# Patient Record
Sex: Female | Born: 1950
Health system: Southern US, Community
[De-identification: ages and names within clinical notes are randomized; demographics above are authoritative.]

## PROBLEM LIST (undated history)

## (undated) DIAGNOSIS — R011 Cardiac murmur, unspecified: Secondary | ICD-10-CM

## (undated) DIAGNOSIS — M1990 Unspecified osteoarthritis, unspecified site: Secondary | ICD-10-CM

## (undated) DIAGNOSIS — K219 Gastro-esophageal reflux disease without esophagitis: Secondary | ICD-10-CM

## (undated) DIAGNOSIS — K8020 Calculus of gallbladder without cholecystitis without obstruction: Secondary | ICD-10-CM

## (undated) DIAGNOSIS — F419 Anxiety disorder, unspecified: Secondary | ICD-10-CM

## (undated) DIAGNOSIS — Z8669 Personal history of other diseases of the nervous system and sense organs: Secondary | ICD-10-CM

## (undated) DIAGNOSIS — N809 Endometriosis, unspecified: Secondary | ICD-10-CM

## (undated) DIAGNOSIS — G459 Transient cerebral ischemic attack, unspecified: Secondary | ICD-10-CM

## (undated) DIAGNOSIS — M2550 Pain in unspecified joint: Secondary | ICD-10-CM

## (undated) DIAGNOSIS — T7840XA Allergy, unspecified, initial encounter: Secondary | ICD-10-CM

## (undated) DIAGNOSIS — G4730 Sleep apnea, unspecified: Secondary | ICD-10-CM

## (undated) DIAGNOSIS — Z9889 Other specified postprocedural states: Secondary | ICD-10-CM

## (undated) DIAGNOSIS — K317 Polyp of stomach and duodenum: Secondary | ICD-10-CM

## (undated) DIAGNOSIS — K3184 Gastroparesis: Secondary | ICD-10-CM

## (undated) DIAGNOSIS — R112 Nausea with vomiting, unspecified: Secondary | ICD-10-CM

## (undated) DIAGNOSIS — I10 Essential (primary) hypertension: Secondary | ICD-10-CM

## (undated) DIAGNOSIS — R51 Headache: Secondary | ICD-10-CM

## (undated) DIAGNOSIS — K589 Irritable bowel syndrome without diarrhea: Secondary | ICD-10-CM

## (undated) DIAGNOSIS — K5900 Constipation, unspecified: Secondary | ICD-10-CM

## (undated) DIAGNOSIS — K635 Polyp of colon: Secondary | ICD-10-CM

## (undated) DIAGNOSIS — E119 Type 2 diabetes mellitus without complications: Secondary | ICD-10-CM

## (undated) DIAGNOSIS — M519 Unspecified thoracic, thoracolumbar and lumbosacral intervertebral disc disorder: Secondary | ICD-10-CM

## (undated) DIAGNOSIS — R0600 Dyspnea, unspecified: Secondary | ICD-10-CM

## (undated) DIAGNOSIS — E785 Hyperlipidemia, unspecified: Secondary | ICD-10-CM

## (undated) DIAGNOSIS — R519 Headache, unspecified: Secondary | ICD-10-CM

## (undated) DIAGNOSIS — Z953 Presence of xenogenic heart valve: Secondary | ICD-10-CM

## (undated) DIAGNOSIS — I5032 Chronic diastolic (congestive) heart failure: Secondary | ICD-10-CM

## (undated) DIAGNOSIS — I639 Cerebral infarction, unspecified: Secondary | ICD-10-CM

## (undated) HISTORY — PX: ESOPHAGOGASTRODUODENOSCOPY: SHX1529

## (undated) HISTORY — DX: Dyspnea, unspecified: R06.00

## (undated) HISTORY — DX: Polyp of stomach and duodenum: K31.7

## (undated) HISTORY — PX: OOPHORECTOMY: SHX86

## (undated) HISTORY — DX: Endometriosis, unspecified: N80.9

## (undated) HISTORY — PX: LAPAROSCOPIC CHOLECYSTECTOMY: SUR755

## (undated) HISTORY — DX: Essential (primary) hypertension: I10

## (undated) HISTORY — PX: ROTATOR CUFF REPAIR: SHX139

## (undated) HISTORY — DX: Transient cerebral ischemic attack, unspecified: G45.9

## (undated) HISTORY — DX: Allergy, unspecified, initial encounter: T78.40XA

## (undated) HISTORY — DX: Pain in unspecified joint: M25.50

## (undated) HISTORY — DX: Constipation, unspecified: K59.00

## (undated) HISTORY — PX: ABDOMINAL HYSTERECTOMY: SHX81

## (undated) HISTORY — DX: Gastroparesis: K31.84

## (undated) HISTORY — PX: LASIK: SHX215

## (undated) HISTORY — DX: Polyp of colon: K63.5

## (undated) HISTORY — DX: Personal history of other diseases of the nervous system and sense organs: Z86.69

## (undated) HISTORY — DX: Unspecified thoracic, thoracolumbar and lumbosacral intervertebral disc disorder: M51.9

## (undated) HISTORY — PX: COLONOSCOPY: SHX174

## (undated) HISTORY — DX: Hyperlipidemia, unspecified: E78.5

## (undated) HISTORY — DX: Calculus of gallbladder without cholecystitis without obstruction: K80.20

## (undated) HISTORY — DX: Irritable bowel syndrome, unspecified: K58.9

## (undated) HISTORY — PX: EYE SURGERY: SHX253

## (undated) HISTORY — PX: CARPAL TUNNEL RELEASE: SHX101

## (undated) HISTORY — DX: Cerebral infarction, unspecified: I63.9

---

## 1978-06-24 HISTORY — PX: BREAST ENHANCEMENT SURGERY: SHX7

## 1999-01-15 ENCOUNTER — Other Ambulatory Visit: Admission: RE | Admit: 1999-01-15 | Discharge: 1999-01-15 | Payer: Self-pay | Admitting: Obstetrics and Gynecology

## 1999-02-28 ENCOUNTER — Ambulatory Visit (HOSPITAL_COMMUNITY): Admission: RE | Admit: 1999-02-28 | Discharge: 1999-02-28 | Payer: Self-pay | Admitting: Cardiovascular Disease

## 1999-02-28 ENCOUNTER — Encounter: Payer: Self-pay | Admitting: Cardiovascular Disease

## 1999-04-24 ENCOUNTER — Encounter (INDEPENDENT_AMBULATORY_CARE_PROVIDER_SITE_OTHER): Payer: Self-pay

## 1999-04-24 ENCOUNTER — Other Ambulatory Visit: Admission: RE | Admit: 1999-04-24 | Discharge: 1999-04-24 | Payer: Self-pay | Admitting: Internal Medicine

## 1999-05-29 ENCOUNTER — Ambulatory Visit (HOSPITAL_COMMUNITY): Admission: RE | Admit: 1999-05-29 | Discharge: 1999-05-29 | Payer: Self-pay | Admitting: Internal Medicine

## 2000-01-05 ENCOUNTER — Emergency Department (HOSPITAL_COMMUNITY): Admission: EM | Admit: 2000-01-05 | Discharge: 2000-01-05 | Payer: Self-pay | Admitting: Emergency Medicine

## 2000-02-08 ENCOUNTER — Other Ambulatory Visit: Admission: RE | Admit: 2000-02-08 | Discharge: 2000-02-08 | Payer: Self-pay | Admitting: Obstetrics and Gynecology

## 2000-12-24 ENCOUNTER — Other Ambulatory Visit: Admission: RE | Admit: 2000-12-24 | Discharge: 2000-12-24 | Payer: Self-pay | Admitting: Obstetrics and Gynecology

## 2001-07-01 ENCOUNTER — Encounter: Payer: Self-pay | Admitting: Otolaryngology

## 2001-07-01 ENCOUNTER — Encounter: Admission: RE | Admit: 2001-07-01 | Discharge: 2001-07-01 | Payer: Self-pay | Admitting: Otolaryngology

## 2001-07-31 ENCOUNTER — Encounter: Admission: RE | Admit: 2001-07-31 | Discharge: 2001-07-31 | Payer: Self-pay | Admitting: Otolaryngology

## 2001-07-31 ENCOUNTER — Encounter: Payer: Self-pay | Admitting: Otolaryngology

## 2002-02-24 ENCOUNTER — Other Ambulatory Visit: Admission: RE | Admit: 2002-02-24 | Discharge: 2002-02-24 | Payer: Self-pay | Admitting: Obstetrics and Gynecology

## 2003-01-19 ENCOUNTER — Other Ambulatory Visit: Admission: RE | Admit: 2003-01-19 | Discharge: 2003-01-19 | Payer: Self-pay | Admitting: Obstetrics and Gynecology

## 2003-02-01 ENCOUNTER — Other Ambulatory Visit: Admission: RE | Admit: 2003-02-01 | Discharge: 2003-02-01 | Payer: Self-pay | Admitting: Obstetrics and Gynecology

## 2004-09-25 ENCOUNTER — Ambulatory Visit: Payer: Self-pay | Admitting: Internal Medicine

## 2004-09-27 ENCOUNTER — Ambulatory Visit: Payer: Self-pay | Admitting: Internal Medicine

## 2004-10-03 ENCOUNTER — Ambulatory Visit: Payer: Self-pay | Admitting: Internal Medicine

## 2005-02-22 ENCOUNTER — Ambulatory Visit: Payer: Self-pay | Admitting: Internal Medicine

## 2005-02-27 ENCOUNTER — Ambulatory Visit: Payer: Self-pay | Admitting: Internal Medicine

## 2005-03-04 ENCOUNTER — Ambulatory Visit: Payer: Self-pay | Admitting: Internal Medicine

## 2005-03-12 ENCOUNTER — Encounter: Admission: RE | Admit: 2005-03-12 | Discharge: 2005-03-12 | Payer: Self-pay | Admitting: Internal Medicine

## 2005-03-18 ENCOUNTER — Ambulatory Visit: Payer: Self-pay | Admitting: Internal Medicine

## 2005-03-27 ENCOUNTER — Ambulatory Visit: Payer: Self-pay | Admitting: Internal Medicine

## 2005-04-03 ENCOUNTER — Ambulatory Visit (HOSPITAL_COMMUNITY): Admission: RE | Admit: 2005-04-03 | Discharge: 2005-04-03 | Payer: Self-pay | Admitting: Internal Medicine

## 2005-04-09 ENCOUNTER — Encounter (INDEPENDENT_AMBULATORY_CARE_PROVIDER_SITE_OTHER): Payer: Self-pay | Admitting: Specialist

## 2005-04-09 ENCOUNTER — Ambulatory Visit: Payer: Self-pay | Admitting: Internal Medicine

## 2005-04-29 ENCOUNTER — Ambulatory Visit: Payer: Self-pay | Admitting: Internal Medicine

## 2005-05-20 ENCOUNTER — Ambulatory Visit: Payer: Self-pay | Admitting: Internal Medicine

## 2005-06-03 ENCOUNTER — Encounter: Admission: RE | Admit: 2005-06-03 | Discharge: 2005-07-16 | Payer: Self-pay | Admitting: Sports Medicine

## 2005-06-24 HISTORY — PX: BLADDER SUSPENSION: SHX72

## 2005-07-23 ENCOUNTER — Encounter: Admission: RE | Admit: 2005-07-23 | Discharge: 2005-09-19 | Payer: Self-pay | Admitting: Sports Medicine

## 2006-04-15 ENCOUNTER — Ambulatory Visit (HOSPITAL_COMMUNITY): Admission: RE | Admit: 2006-04-15 | Discharge: 2006-04-16 | Payer: Self-pay | Admitting: Obstetrics and Gynecology

## 2006-04-20 ENCOUNTER — Inpatient Hospital Stay (HOSPITAL_COMMUNITY): Admission: AD | Admit: 2006-04-20 | Discharge: 2006-04-20 | Payer: Self-pay | Admitting: Obstetrics and Gynecology

## 2006-05-20 ENCOUNTER — Ambulatory Visit: Payer: Self-pay | Admitting: Internal Medicine

## 2006-10-22 ENCOUNTER — Encounter: Admission: RE | Admit: 2006-10-22 | Discharge: 2006-10-22 | Payer: Self-pay | Admitting: Sports Medicine

## 2006-11-05 ENCOUNTER — Encounter: Admission: RE | Admit: 2006-11-05 | Discharge: 2006-11-05 | Payer: Self-pay | Admitting: Sports Medicine

## 2007-02-12 DIAGNOSIS — J309 Allergic rhinitis, unspecified: Secondary | ICD-10-CM | POA: Insufficient documentation

## 2007-02-12 DIAGNOSIS — J45909 Unspecified asthma, uncomplicated: Secondary | ICD-10-CM | POA: Insufficient documentation

## 2007-04-16 ENCOUNTER — Ambulatory Visit: Payer: Self-pay | Admitting: Internal Medicine

## 2007-04-28 ENCOUNTER — Telehealth (INDEPENDENT_AMBULATORY_CARE_PROVIDER_SITE_OTHER): Payer: Self-pay | Admitting: *Deleted

## 2007-08-03 ENCOUNTER — Ambulatory Visit: Payer: Self-pay | Admitting: Internal Medicine

## 2007-08-03 LAB — CONVERTED CEMR LAB
ALT: 18 units/L (ref 0–35)
AST: 21 units/L (ref 0–37)
Albumin: 4.1 g/dL (ref 3.5–5.2)
Alkaline Phosphatase: 154 units/L — ABNORMAL HIGH (ref 39–117)
BUN: 12 mg/dL (ref 6–23)
Basophils Absolute: 0 10*3/uL (ref 0.0–0.1)
Basophils Relative: 0.4 % (ref 0.0–1.0)
Bilirubin Urine: NEGATIVE
Bilirubin, Direct: 0.2 mg/dL (ref 0.0–0.3)
Blood in Urine, dipstick: NEGATIVE
CO2: 28 meq/L (ref 19–32)
Calcium: 8.9 mg/dL (ref 8.4–10.5)
Chloride: 103 meq/L (ref 96–112)
Cholesterol: 211 mg/dL (ref 0–200)
Creatinine, Ser: 0.7 mg/dL (ref 0.4–1.2)
Direct LDL: 133.3 mg/dL
Eosinophils Absolute: 0 10*3/uL (ref 0.0–0.6)
Eosinophils Relative: 0.5 % (ref 0.0–5.0)
GFR calc Af Amer: 111 mL/min
GFR calc non Af Amer: 92 mL/min
Glucose, Bld: 99 mg/dL (ref 70–99)
Glucose, Urine, Semiquant: NEGATIVE
HCT: 44.7 % (ref 36.0–46.0)
HDL: 57 mg/dL (ref >39.0)
Hemoglobin: 15 g/dL (ref 12.0–15.0)
Ketones, urine, test strip: NEGATIVE
Lymphocytes Relative: 44.7 % (ref 12.0–46.0)
MCHC: 33.6 g/dL (ref 30.0–36.0)
MCV: 90.4 fL (ref 78.0–100.0)
Monocytes Absolute: 0.6 10*3/uL (ref 0.2–0.7)
Monocytes Relative: 10.6 % (ref 3.0–11.0)
Neutro Abs: 2.5 10*3/uL (ref 1.4–7.7)
Neutrophils Relative %: 43.8 % (ref 43.0–77.0)
Nitrite: NEGATIVE
Platelets: 230 10*3/uL (ref 150–400)
Potassium: 4.3 meq/L (ref 3.5–5.1)
RBC: 4.95 M/uL (ref 3.87–5.11)
RDW: 11.7 % (ref 11.5–14.6)
Sodium: 140 meq/L (ref 135–145)
Specific Gravity, Urine: >=1.03
TSH: 3.91 microintl units/mL (ref 0.35–5.50)
Total Bilirubin: 0.6 mg/dL (ref 0.3–1.2)
Total CHOL/HDL Ratio: 3.7
Total Protein: 6.6 g/dL (ref 6.0–8.3)
Triglycerides: 120 mg/dL (ref 0–149)
Urobilinogen, UA: NEGATIVE
VLDL: 24 mg/dL (ref 0–40)
WBC Urine, dipstick: NEGATIVE
WBC: 5.8 10*3/uL (ref 4.5–10.5)
pH: 5.5

## 2007-08-06 ENCOUNTER — Ambulatory Visit: Payer: Self-pay | Admitting: Internal Medicine

## 2007-08-07 ENCOUNTER — Telehealth: Payer: Self-pay | Admitting: Internal Medicine

## 2007-10-05 ENCOUNTER — Ambulatory Visit: Payer: Self-pay | Admitting: Internal Medicine

## 2008-02-02 ENCOUNTER — Ambulatory Visit: Payer: Self-pay | Admitting: Internal Medicine

## 2008-02-02 LAB — CONVERTED CEMR LAB
ALT: 17 units/L (ref 0–35)
AST: 21 units/L (ref 0–37)
Albumin: 3.8 g/dL (ref 3.5–5.2)
Alkaline Phosphatase: 118 units/L — ABNORMAL HIGH (ref 39–117)
Bilirubin, Direct: 0.1 mg/dL (ref 0.0–0.3)
Cholesterol: 226 mg/dL (ref 0–200)
Direct LDL: 147.2 mg/dL
HDL: 46.2 mg/dL (ref >39.0)
Total Bilirubin: 0.9 mg/dL (ref 0.3–1.2)
Total CHOL/HDL Ratio: 4.9
Total Protein: 6.3 g/dL (ref 6.0–8.3)
Triglycerides: 169 mg/dL — ABNORMAL HIGH (ref 0–149)
VLDL: 34 mg/dL (ref 0–40)

## 2008-02-09 ENCOUNTER — Ambulatory Visit: Payer: Self-pay | Admitting: Internal Medicine

## 2008-02-09 DIAGNOSIS — E785 Hyperlipidemia, unspecified: Secondary | ICD-10-CM

## 2008-02-09 DIAGNOSIS — E1169 Type 2 diabetes mellitus with other specified complication: Secondary | ICD-10-CM | POA: Insufficient documentation

## 2008-02-10 LAB — CONVERTED CEMR LAB: Pap Smear: NORMAL

## 2008-05-03 ENCOUNTER — Ambulatory Visit: Payer: Self-pay | Admitting: Internal Medicine

## 2008-05-03 LAB — CONVERTED CEMR LAB
ALT: 21 units/L (ref 0–35)
AST: 21 units/L (ref 0–37)
Albumin: 3.8 g/dL (ref 3.5–5.2)
Alkaline Phosphatase: 122 units/L — ABNORMAL HIGH (ref 39–117)
Bilirubin, Direct: 0.1 mg/dL (ref 0.0–0.3)
Cholesterol: 200 mg/dL (ref 0–200)
HDL: 55.5 mg/dL (ref >39.0)
LDL Cholesterol: 126 mg/dL — ABNORMAL HIGH (ref 0–99)
Total Bilirubin: 0.8 mg/dL (ref 0.3–1.2)
Total CHOL/HDL Ratio: 3.6
Total Protein: 6.9 g/dL (ref 6.0–8.3)
Triglycerides: 93 mg/dL (ref 0–149)
VLDL: 19 mg/dL (ref 0–40)

## 2008-05-10 ENCOUNTER — Ambulatory Visit: Payer: Self-pay | Admitting: Internal Medicine

## 2008-07-19 ENCOUNTER — Encounter: Admission: RE | Admit: 2008-07-19 | Discharge: 2008-07-19 | Payer: Self-pay | Admitting: Orthopedic Surgery

## 2008-08-02 ENCOUNTER — Encounter: Admission: RE | Admit: 2008-08-02 | Discharge: 2008-08-02 | Payer: Self-pay | Admitting: Orthopedic Surgery

## 2008-08-04 ENCOUNTER — Ambulatory Visit: Payer: Self-pay | Admitting: Internal Medicine

## 2008-08-04 LAB — CONVERTED CEMR LAB
ALT: 23 units/L (ref 0–35)
AST: 20 units/L (ref 0–37)
Albumin: 3.6 g/dL (ref 3.5–5.2)
Alkaline Phosphatase: 118 units/L — ABNORMAL HIGH (ref 39–117)
BUN: 15 mg/dL (ref 6–23)
Basophils Absolute: 0.1 10*3/uL (ref 0.0–0.1)
Basophils Relative: 0.7 % (ref 0.0–3.0)
Bilirubin Urine: NEGATIVE
Bilirubin, Direct: 0.1 mg/dL (ref 0.0–0.3)
Blood in Urine, dipstick: NEGATIVE
CO2: 30 meq/L (ref 19–32)
Calcium: 9.3 mg/dL (ref 8.4–10.5)
Chloride: 103 meq/L (ref 96–112)
Cholesterol: 261 mg/dL (ref 0–200)
Creatinine, Ser: 0.7 mg/dL (ref 0.4–1.2)
Direct LDL: 165.7 mg/dL
Eosinophils Absolute: 0 10*3/uL (ref 0.0–0.7)
Eosinophils Relative: 0.3 % (ref 0.0–5.0)
GFR calc Af Amer: 111 mL/min
GFR calc non Af Amer: 92 mL/min
Glucose, Bld: 115 mg/dL — ABNORMAL HIGH (ref 70–99)
Glucose, Urine, Semiquant: NEGATIVE
HCT: 41.7 % (ref 36.0–46.0)
HDL: 83.7 mg/dL (ref >39.0)
Hemoglobin: 14.5 g/dL (ref 12.0–15.0)
Ketones, urine, test strip: NEGATIVE
Lymphocytes Relative: 28.3 % (ref 12.0–46.0)
MCHC: 34.8 g/dL (ref 30.0–36.0)
MCV: 93.6 fL (ref 78.0–100.0)
Monocytes Absolute: 0.6 10*3/uL (ref 0.1–1.0)
Monocytes Relative: 5.8 % (ref 3.0–12.0)
Neutro Abs: 6.4 10*3/uL (ref 1.4–7.7)
Neutrophils Relative %: 64.9 % (ref 43.0–77.0)
Nitrite: NEGATIVE
Platelets: 312 10*3/uL (ref 150–400)
Potassium: 4.6 meq/L (ref 3.5–5.1)
RBC: 4.46 M/uL (ref 3.87–5.11)
RDW: 11.7 % (ref 11.5–14.6)
Sodium: 142 meq/L (ref 135–145)
Specific Gravity, Urine: 1.02
TSH: 1.56 microintl units/mL (ref 0.35–5.50)
Total Bilirubin: 0.8 mg/dL (ref 0.3–1.2)
Total CHOL/HDL Ratio: 3.1
Total Protein: 6.8 g/dL (ref 6.0–8.3)
Triglycerides: 97 mg/dL (ref 0–149)
Urobilinogen, UA: 0.2
VLDL: 19 mg/dL (ref 0–40)
WBC Urine, dipstick: NEGATIVE
WBC: 9.9 10*3/uL (ref 4.5–10.5)
pH: 6

## 2008-08-11 ENCOUNTER — Ambulatory Visit: Payer: Self-pay | Admitting: Internal Medicine

## 2008-08-11 DIAGNOSIS — M545 Low back pain, unspecified: Secondary | ICD-10-CM | POA: Insufficient documentation

## 2008-08-11 LAB — HM MAMMOGRAPHY

## 2008-08-23 ENCOUNTER — Encounter: Payer: Self-pay | Admitting: Internal Medicine

## 2008-09-01 ENCOUNTER — Encounter: Payer: Self-pay | Admitting: Internal Medicine

## 2008-09-07 ENCOUNTER — Encounter: Admission: RE | Admit: 2008-09-07 | Discharge: 2008-10-14 | Payer: Self-pay | Admitting: Neurosurgery

## 2008-10-05 ENCOUNTER — Ambulatory Visit: Payer: Self-pay | Admitting: Internal Medicine

## 2008-10-05 LAB — CONVERTED CEMR LAB: Hgb A1c MFr Bld: 6.3 % (ref 4.6–6.5)

## 2008-10-12 ENCOUNTER — Ambulatory Visit: Payer: Self-pay | Admitting: Internal Medicine

## 2008-10-12 LAB — CONVERTED CEMR LAB
Cholesterol, target level: 200 mg/dL
HDL goal, serum: 40 mg/dL
LDL Goal: 160 mg/dL

## 2008-12-15 ENCOUNTER — Ambulatory Visit: Payer: Self-pay | Admitting: Internal Medicine

## 2008-12-15 DIAGNOSIS — K589 Irritable bowel syndrome without diarrhea: Secondary | ICD-10-CM | POA: Insufficient documentation

## 2009-03-16 ENCOUNTER — Telehealth: Payer: Self-pay | Admitting: Internal Medicine

## 2009-03-16 ENCOUNTER — Ambulatory Visit: Payer: Self-pay | Admitting: Internal Medicine

## 2009-04-20 ENCOUNTER — Ambulatory Visit: Payer: Self-pay | Admitting: Internal Medicine

## 2009-07-05 ENCOUNTER — Ambulatory Visit: Payer: Self-pay | Admitting: Internal Medicine

## 2009-07-05 ENCOUNTER — Encounter (INDEPENDENT_AMBULATORY_CARE_PROVIDER_SITE_OTHER): Payer: Self-pay | Admitting: *Deleted

## 2009-07-05 LAB — CONVERTED CEMR LAB
ALT: 19 units/L (ref 0–35)
AST: 22 units/L (ref 0–37)
Albumin: 3.8 g/dL (ref 3.5–5.2)
Alkaline Phosphatase: 133 units/L — ABNORMAL HIGH (ref 39–117)
BUN: 12 mg/dL (ref 6–23)
Bilirubin, Direct: 0 mg/dL (ref 0.0–0.3)
CO2: 28 meq/L (ref 19–32)
Calcium: 8.9 mg/dL (ref 8.4–10.5)
Chloride: 101 meq/L (ref 96–112)
Cholesterol: 239 mg/dL — ABNORMAL HIGH (ref 0–200)
Creatinine, Ser: 0.7 mg/dL (ref 0.4–1.2)
Direct LDL: 143.2 mg/dL
GFR calc non Af Amer: 91.15 mL/min (ref >60)
Glucose, Bld: 101 mg/dL — ABNORMAL HIGH (ref 70–99)
HDL: 70.4 mg/dL (ref >39.00)
Hgb A1c MFr Bld: 6 % (ref 4.6–6.5)
Potassium: 4.7 meq/L (ref 3.5–5.1)
Sodium: 137 meq/L (ref 135–145)
Total Bilirubin: 0.7 mg/dL (ref 0.3–1.2)
Total CHOL/HDL Ratio: 3
Total Protein: 6.4 g/dL (ref 6.0–8.3)
Triglycerides: 150 mg/dL — ABNORMAL HIGH (ref 0.0–149.0)
VLDL: 30 mg/dL (ref 0.0–40.0)

## 2009-07-06 ENCOUNTER — Encounter (INDEPENDENT_AMBULATORY_CARE_PROVIDER_SITE_OTHER): Payer: Self-pay | Admitting: *Deleted

## 2009-07-12 ENCOUNTER — Ambulatory Visit: Payer: Self-pay | Admitting: Internal Medicine

## 2009-07-20 ENCOUNTER — Encounter (INDEPENDENT_AMBULATORY_CARE_PROVIDER_SITE_OTHER): Payer: Self-pay

## 2009-07-21 ENCOUNTER — Ambulatory Visit: Payer: Self-pay | Admitting: Internal Medicine

## 2009-08-04 ENCOUNTER — Ambulatory Visit: Payer: Self-pay | Admitting: Internal Medicine

## 2009-08-04 LAB — HM COLONOSCOPY

## 2009-08-07 ENCOUNTER — Encounter: Payer: Self-pay | Admitting: Internal Medicine

## 2009-08-15 ENCOUNTER — Telehealth: Payer: Self-pay | Admitting: Internal Medicine

## 2009-08-23 ENCOUNTER — Telehealth: Payer: Self-pay | Admitting: Internal Medicine

## 2009-08-28 ENCOUNTER — Telehealth: Payer: Self-pay | Admitting: Internal Medicine

## 2009-08-29 ENCOUNTER — Ambulatory Visit: Payer: Self-pay | Admitting: Internal Medicine

## 2009-08-30 ENCOUNTER — Telehealth: Payer: Self-pay | Admitting: Internal Medicine

## 2009-09-19 ENCOUNTER — Ambulatory Visit: Payer: Self-pay | Admitting: Internal Medicine

## 2009-09-19 ENCOUNTER — Telehealth: Payer: Self-pay | Admitting: Internal Medicine

## 2009-11-21 ENCOUNTER — Ambulatory Visit: Payer: Self-pay | Admitting: Internal Medicine

## 2009-11-21 LAB — CONVERTED CEMR LAB
ALT: 16 units/L (ref 0–35)
AST: 21 units/L (ref 0–37)
Albumin: 4.1 g/dL (ref 3.5–5.2)
Alkaline Phosphatase: 105 units/L (ref 39–117)
BUN: 14 mg/dL (ref 6–23)
Basophils Absolute: 0 10*3/uL (ref 0.0–0.1)
Basophils Relative: 0.5 % (ref 0.0–3.0)
Bilirubin Urine: NEGATIVE
Bilirubin, Direct: 0.1 mg/dL (ref 0.0–0.3)
Blood in Urine, dipstick: NEGATIVE
CO2: 29 meq/L (ref 19–32)
Calcium: 9.4 mg/dL (ref 8.4–10.5)
Chloride: 97 meq/L (ref 96–112)
Cholesterol: 243 mg/dL — ABNORMAL HIGH (ref 0–200)
Creatinine, Ser: 0.6 mg/dL (ref 0.4–1.2)
Direct LDL: 150.9 mg/dL
Eosinophils Absolute: 0.1 10*3/uL (ref 0.0–0.7)
Eosinophils Relative: 0.9 % (ref 0.0–5.0)
GFR calc non Af Amer: 100.95 mL/min (ref >60)
Glucose, Bld: 105 mg/dL — ABNORMAL HIGH (ref 70–99)
Glucose, Urine, Semiquant: NEGATIVE
HCT: 42 % (ref 36.0–46.0)
HDL: 72.9 mg/dL (ref >39.00)
Hemoglobin: 14.5 g/dL (ref 12.0–15.0)
Lymphocytes Relative: 33.6 % (ref 12.0–46.0)
Lymphs Abs: 2.4 10*3/uL (ref 0.7–4.0)
MCHC: 34.5 g/dL (ref 30.0–36.0)
MCV: 93.6 fL (ref 78.0–100.0)
Monocytes Absolute: 0.6 10*3/uL (ref 0.1–1.0)
Monocytes Relative: 8.1 % (ref 3.0–12.0)
Neutro Abs: 4 10*3/uL (ref 1.4–7.7)
Neutrophils Relative %: 56.9 % (ref 43.0–77.0)
Nitrite: NEGATIVE
Platelets: 304 10*3/uL (ref 150.0–400.0)
Potassium: 4.7 meq/L (ref 3.5–5.1)
Protein, U semiquant: NEGATIVE
RBC: 4.49 M/uL (ref 3.87–5.11)
RDW: 13 % (ref 11.5–14.6)
Sodium: 136 meq/L (ref 135–145)
Specific Gravity, Urine: 1.02
TSH: 1.19 microintl units/mL (ref 0.35–5.50)
Total Bilirubin: 0.6 mg/dL (ref 0.3–1.2)
Total CHOL/HDL Ratio: 3
Total Protein: 6.9 g/dL (ref 6.0–8.3)
Triglycerides: 101 mg/dL (ref 0.0–149.0)
Urobilinogen, UA: 0.2
VLDL: 20.2 mg/dL (ref 0.0–40.0)
WBC Urine, dipstick: NEGATIVE
WBC: 7 10*3/uL (ref 4.5–10.5)
pH: 6

## 2009-11-28 ENCOUNTER — Ambulatory Visit: Payer: Self-pay | Admitting: Internal Medicine

## 2009-12-07 ENCOUNTER — Telehealth: Payer: Self-pay | Admitting: Internal Medicine

## 2009-12-07 DIAGNOSIS — R634 Abnormal weight loss: Secondary | ICD-10-CM | POA: Insufficient documentation

## 2010-01-04 ENCOUNTER — Telehealth: Payer: Self-pay | Admitting: Internal Medicine

## 2010-02-08 ENCOUNTER — Encounter: Admission: RE | Admit: 2010-02-08 | Discharge: 2010-03-23 | Payer: Self-pay | Admitting: Internal Medicine

## 2010-02-08 ENCOUNTER — Encounter: Payer: Self-pay | Admitting: Internal Medicine

## 2010-03-06 ENCOUNTER — Ambulatory Visit: Payer: Self-pay | Admitting: Internal Medicine

## 2010-03-06 LAB — CONVERTED CEMR LAB
ALT: 15 units/L (ref 0–35)
AST: 22 units/L (ref 0–37)
Albumin: 4 g/dL (ref 3.5–5.2)
Alkaline Phosphatase: 103 units/L (ref 39–117)
Bilirubin, Direct: 0.2 mg/dL (ref 0.0–0.3)
Cholesterol: 250 mg/dL — ABNORMAL HIGH (ref 0–200)
Direct LDL: 159.6 mg/dL
HDL: 68.2 mg/dL (ref >39.00)
Total Bilirubin: 0.7 mg/dL (ref 0.3–1.2)
Total CHOL/HDL Ratio: 4
Total Protein: 6.6 g/dL (ref 6.0–8.3)
Triglycerides: 97 mg/dL (ref 0.0–149.0)
VLDL: 19.4 mg/dL (ref 0.0–40.0)

## 2010-03-13 ENCOUNTER — Ambulatory Visit: Payer: Self-pay | Admitting: Internal Medicine

## 2010-03-26 LAB — HM MAMMOGRAPHY

## 2010-03-26 LAB — HM PAP SMEAR

## 2010-05-21 ENCOUNTER — Telehealth: Payer: Self-pay | Admitting: Internal Medicine

## 2010-06-05 ENCOUNTER — Ambulatory Visit: Payer: Self-pay | Admitting: Internal Medicine

## 2010-06-05 LAB — CONVERTED CEMR LAB
ALT: 16 units/L (ref 0–35)
AST: 22 units/L (ref 0–37)
Albumin: 4.1 g/dL (ref 3.5–5.2)
Alkaline Phosphatase: 88 units/L (ref 39–117)
Bilirubin, Direct: 0.1 mg/dL (ref 0.0–0.3)
Cholesterol: 154 mg/dL (ref 0–200)
HDL: 55.8 mg/dL (ref >39.00)
LDL Cholesterol: 87 mg/dL (ref 0–99)
Total Bilirubin: 0.6 mg/dL (ref 0.3–1.2)
Total CHOL/HDL Ratio: 3
Total Protein: 6.6 g/dL (ref 6.0–8.3)
Triglycerides: 54 mg/dL (ref 0.0–149.0)
VLDL: 10.8 mg/dL (ref 0.0–40.0)

## 2010-06-14 ENCOUNTER — Ambulatory Visit: Payer: Self-pay | Admitting: Internal Medicine

## 2010-06-14 DIAGNOSIS — H6980 Other specified disorders of Eustachian tube, unspecified ear: Secondary | ICD-10-CM | POA: Insufficient documentation

## 2010-06-22 ENCOUNTER — Emergency Department (HOSPITAL_BASED_OUTPATIENT_CLINIC_OR_DEPARTMENT_OTHER)
Admission: EM | Admit: 2010-06-22 | Discharge: 2010-06-23 | Payer: Self-pay | Source: Home / Self Care | Admitting: Emergency Medicine

## 2010-06-23 ENCOUNTER — Emergency Department (HOSPITAL_COMMUNITY)
Admission: EM | Admit: 2010-06-23 | Discharge: 2010-06-23 | Payer: Self-pay | Source: Home / Self Care | Admitting: Emergency Medicine

## 2010-06-29 ENCOUNTER — Telehealth: Payer: Self-pay | Admitting: Internal Medicine

## 2010-07-03 ENCOUNTER — Ambulatory Visit (HOSPITAL_COMMUNITY)
Admission: RE | Admit: 2010-07-03 | Discharge: 2010-07-03 | Payer: Self-pay | Source: Home / Self Care | Attending: General Surgery | Admitting: General Surgery

## 2010-07-09 LAB — MRSA CULTURE

## 2010-07-09 LAB — SURGICAL PCR SCREEN: Staphylococcus aureus: INVALID — AB

## 2010-07-26 NOTE — Assessment & Plan Note (Signed)
Summary: cpx/cjr   Vital Signs:  Patient profile:   60 year old female Height:      66 inches Weight:      210 pounds BMI:     34.02 Temp:     98.2 degrees F oral Pulse rate:   72 / minute Resp:     14 per minute BP sitting:   126 / 80  (left arm)  Vitals Entered By: Willy Eddy, LPN (November 28, 1608 10:53 AM)  Nutrition Counseling: Patient's BMI is greater than 25 and therefore counseled on weight management options. CC: cpx, Lipid Management   CC:  cpx and Lipid Management.  History of Present Illness: The pt was asked about all immunizations, health maint. services that are appropriate to their age and was given guidance on diet exercize  and weight management weigth is a significant issue has recently been on 5 weeks of antibiotics for sinus and gained weight   Lipid Management History:      Positive NCEP/ATP III risk factors include female age 60 years old or older.  Negative NCEP/ATP III risk factors include HDL cholesterol greater than 60, no family history for ischemic heart disease, non-tobacco-user status, non-hypertensive, no ASHD (atherosclerotic heart disease), no prior stroke/TIA, no peripheral vascular disease, and no history of aortic aneurysm.     Preventive Screening-Counseling & Management  Alcohol-Tobacco     Smoking Status: never     Passive Smoke Exposure: no  Problems Prior to Update: 1)  Accidental Fall From Bed  (ICD-E884.4) 2)  Acute Ethmoidal Sinusitis  (ICD-461.2) 3)  Irritable Bowel Syndrome  (ICD-564.1) 4)  Obesity, Morbid  (ICD-278.01) 5)  Back Pain, Lumbar, With Radiculopathy  (ICD-724.4) 6)  Hyperlipidemia  (ICD-272.4) 7)  Hormone Replacement Therapy  (ICD-V07.4) 8)  Physical Examination  (ICD-V70.0) 9)  Asthma  (ICD-493.90) 10)  Allergic Rhinitis  (ICD-477.9) 11)  Family History Diabetes 1st Degree Relative  (ICD-V18.0)  Current Problems (verified): 1)  Accidental Fall From Bed  (ICD-E884.4) 2)  Acute Ethmoidal Sinusitis   (ICD-461.2) 3)  Irritable Bowel Syndrome  (ICD-564.1) 4)  Obesity, Morbid  (ICD-278.01) 5)  Back Pain, Lumbar, With Radiculopathy  (ICD-724.4) 6)  Hyperlipidemia  (ICD-272.4) 7)  Hormone Replacement Therapy  (ICD-V07.4) 8)  Physical Examination  (ICD-V70.0) 9)  Asthma  (ICD-493.90) 10)  Allergic Rhinitis  (ICD-477.9) 11)  Family History Diabetes 1st Degree Relative  (ICD-V18.0)  Medications Prior to Update: 1)  Zetia 10 Mg  Tabs (Ezetimibe) .... Once Daily 2)  Claritin 10 Mg Tabs (Loratadine) .[name].. 1 Once Daily 3)  Astelin 137 Mcg/spray  Soln (Azelastine Hcl) .... Two Times A Day 4)  Nasonex 50 Mcg/act  Susp (Mometasone Furoate) .... Once Daily 5)  Nabumetone 750 Mg  Tabs (Nabumetone) .... Once Daily 6)  Cymbalta 60 Mg  Cpep (Duloxetine Hcl) .... 0ne By Mouth Daily 7)  Prilosec 20 Mg  Cpdr (Omeprazole) .... Once Daily 8)  Estradiol 1 Mg Tabs (Estradiol) .[name].. 1 Once Daily 9)  Metamucil Smooth Texture 28.3 %  Powd (Psyllium) .... 2 Tbls Q Am 10)  Allergy Injections .... Weekly 11)  Fish Oil Concentrate 1000 Mg  Caps (Omega-3 Fatty Acids) .... Kril Oil  2 By Mouth Two Times A Day 12)  Phentermine Hcl 37.5 Mg Tabs (Phentermine Hcl) .... One By Mouth Daily 13)  Metoclopramide Hcl 10 Mg Tabs (Metoclopramide Hcl) .[name].. 1 At Bedtime 14)  Sudafed 12 Hour 120 Mg Xr12h-Tab (Pseudoephedrine Hcl) .[name].. 1 Once Daily 15)  Allerx Dose Pack  Misc (Pse-Methscop & Cpm-Pe-Methscop) .... As Directed 16)  Zithromax Z-Pak 250 Mg Tabs (Azithromycin) .... As Directed 17)  Levaquin 500 Mg Tabs (Levofloxacin) .... One Daily For 21 Days  Current Medications (verified): 1)  Crestor 20 Mg Tabs (Rosuvastatin Calcium) .... One By Mouth Weekly On Tuesday Nights 2)  Claritin 10 Mg Tabs (Loratadine) .[name].. 1 Once Daily 3)  Astelin 137 Mcg/spray  Soln (Azelastine Hcl) .... Two Times A Day 4)  Nasonex 50 Mcg/act  Susp (Mometasone Furoate) .... Once Daily 5)  Nabumetone 750 Mg  Tabs (Nabumetone) .... Once Daily 6)   Cymbalta 60 Mg  Cpep (Duloxetine Hcl) .... 0ne By Mouth Daily 7)  Prilosec 20 Mg  Cpdr (Omeprazole) .... Once Daily 8)  Estradiol 1 Mg Tabs (Estradiol) .[name].. 1 Once Daily 9)  Metamucil Smooth Texture 28.3 %  Powd (Psyllium) .... 2 Tbls Q Am 10)  Allergy Injections .... Weekly 11)  Fish Oil Concentrate 1000 Mg  Caps (Omega-3 Fatty Acids) .... Kril Oil  2 By Mouth Two Times A Day 12)  Phentermine Hcl 37.5 Mg Tabs (Phentermine Hcl) .... One By Mouth Daily 13)  Metoclopramide Hcl 10 Mg Tabs (Metoclopramide Hcl) .[name].. 1 At Bedtime  Allergies (verified): 1)  ! Penicillin 2)  ! Penicillin  Past History:  Family History: Last updated: 08/06/2007 Family History Diabetes 1st degree relative Family History of Cardiovascular disorder  grand mother  Social History: Last updated: 02/12/2007 Married Never Smoked Alcohol use-yes  Risk Factors: Smoking Status: never (11/28/2009) Passive Smoke Exposure: no (11/28/2009)  Past medical, surgical, family and social histories (including risk factors) reviewed, and no changes noted (except as noted below).  Past Medical History: Reviewed history from 08/11/2008 and no changes required. Allergic rhinitis Asthma IBS Hyperlipidemia lumbar disc dz  Past Surgical History: Reviewed history from 02/12/2007 and no changes required. Colonoscopy-04/09/2005 EGD-04/09/2005 Carpal tunnel release-bilat Hysterectomy Oophorectomy Rotator cuff repair-left Splenectomy Breast implantation LASIK eye sx  Family History: Reviewed history from 08/06/2007 and no changes required. Family History Diabetes 1st degree relative Family History of Cardiovascular disorder  grand mother  Social History: Reviewed history from 02/12/2007 and no changes required. Married Never Smoked Alcohol use-yes  Review of Systems       The patient complains of weight gain, decreased hearing, and hoarseness.  The patient denies anorexia, fever, weight loss, vision loss, chest  pain, syncope, dyspnea on exertion, peripheral edema, prolonged cough, headaches, hemoptysis, abdominal pain, melena, hematochezia, severe indigestion/heartburn, hematuria, incontinence, genital sores, muscle weakness, suspicious skin lesions, transient blindness, difficulty walking, depression, unusual weight change, abnormal bleeding, enlarged lymph nodes, angioedema, and breast masses.    Physical Exam  General:  overweight-appearing.  alert.   Head:  Normocephalic and atraumatic without obvious abnormalities. No apparent alopecia or balding. Eyes:  pupils equal and pupils round.    allergies Ears:  R ear normal and L ear normal.   Nose:  nasal dischargemucosal pallor.   Mouth:  good dentition and pharynx pink and moist.   Neck:  No deformities, masses, or tenderness noted. Lungs:  normal respiratory effort and no wheezes.   Heart:  normal rate and regular rhythm.   Abdomen:  soft, non-tender, and distended.   Msk:  normal ROM and no joint tenderness.   Pulses:  R and L carotid,radial,femoral,dorsalis pedis and posterior tibial pulses are full and equal bilaterally Extremities:  trace left pedal edema and trace right pedal edema.   Neurologic:  alert & oriented X3 and DTRs  symmetrical and normal.     Impression & Recommendations:  Problem # 1:  OBESITY, MORBID (ICD-278.01)  weight loss  Ht: 66 (11/28/2009)   Wt: 210 (11/28/2009)   BMI: 34.02 (11/28/2009)  Problem # 2:  HYPERLIPIDEMIA (ICD-272.4) Assessment: Deteriorated the pt has dymetaolic risks and should be onb a statin Her updated medication list for this problem includes:    Crestor 20 Mg Tabs (Rosuvastatin calcium) ..... One by mouth weekly on tuesday nights  Labs Reviewed: SGOT: 21 (11/21/2009)   SGPT: 16 (11/21/2009)  Lipid Goals: Chol Goal: 200 (10/12/2008)   HDL Goal: 40 (10/12/2008)   LDL Goal: 160 (10/12/2008)   TG Goal: 150 (10/12/2008)  Prior 10 Yr Risk Heart Disease: Not enough information (10/12/2008)    HDL:72.90 (11/21/2009), 70.40 (07/05/2009)  LDL:DEL (08/04/2008), 126 (05/03/2008)  Chol:243 (11/21/2009), 239 (07/05/2009)  Trig:101.0 (11/21/2009), 150.0 (07/05/2009)  Problem # 3:  PHYSICAL EXAMINATION (ICD-V70.0) Assessment: Unchanged  The pt was asked about all immunizations, health maint. services that are appropriate to their age and was given guidance on diet exercize  and weight management  Mammogram: normal (07/07/2008) Pap smear: normal (02/10/2008) Colonoscopy: DONE (08/04/2009) Td Booster: Tdap (08/06/2007)   Flu Vax: Fluvax 3+ (03/16/2009)   Chol: 243 (11/21/2009)   HDL: 72.90 (11/21/2009)   LDL: DEL (08/04/2008)   TG: 101.0 (11/21/2009) TSH: 1.19 (11/21/2009)   HgbA1C: 6.0 (07/05/2009)   Next mammogram due:: 06/2009 (08/11/2008) Next Colonoscopy due:: 07/2014 (08/04/2009)  Discussed using sunscreen, use of alcohol, drug use, self breast exam, routine dental care, routine eye care, schedule for GYN exam, routine physical exam, seat belts, multiple vitamins, osteoporosis prevention, adequate calcium intake in diet, recommendations for immunizations, mammograms and Pap smears.  Discussed exercise and checking cholesterol.  Discussed gun safety, safe sex, and contraception.  Complete Medication List: 1)  Crestor 20 Mg Tabs (Rosuvastatin calcium) .... One by mouth weekly on tuesday nights 2)  Claritin 10 Mg Tabs (Loratadine) .[name].. 1 once daily 3)  Astelin 137 Mcg/spray Soln (Azelastine hcl) .... Two times a day 4)  Nasonex 50 Mcg/act Susp (Mometasone furoate) .... Once daily 5)  Nabumetone 750 Mg Tabs (Nabumetone) .... Once daily 6)  Cymbalta 60 Mg Cpep (Duloxetine hcl) .... 0ne by mouth daily 7)  Prilosec 20 Mg Cpdr (Omeprazole) .... Once daily 8)  Estradiol 1 Mg Tabs (Estradiol) .[name].. 1 once daily 9)  Metamucil Smooth Texture 28.3 % Powd (Psyllium) .... 2 tbls q am 10)  Allergy Injections  .... Weekly 11)  Fish Oil Concentrate 1000 Mg Caps (Omega-3 fatty acids) .... Kril oil  2  by mouth two times a day 12)  Phentermine Hcl 37.5 Mg Tabs (Phentermine hcl) .... One by mouth daily 13)  Metoclopramide Hcl 10 Mg Tabs (Metoclopramide hcl) .[name].. 1 at bedtime  Lipid Assessment/Plan:      Based on NCEP/ATP III, the patient's risk factor category is "0-1 risk factors".  The patient's lipid goals are as follows: Total cholesterol goal is 200; LDL cholesterol goal is 160; HDL cholesterol goal is 40; Triglyceride goal is 150.        Her BMI is calculated to be 34.02.    Patient Instructions: 1)  start the statin samples one a wekk and we will recheck the lipids in 3 months 2)  Please schedule a follow-up appointment in 3 months. 3)  Hepatic Panel prior to visit, ICD-9:995.20 4)  Lipid Panel prior to visit, ICD-9:272.4 5)  You need to lose weight. Consider a lower calorie diet and  regular exercise.

## 2010-07-26 NOTE — Assessment & Plan Note (Signed)
Summary: 3 month  rov/njr   Vital Signs:  Patient profile:   60 year old female Height:      66 inches Weight:      210 pounds BMI:     34.02 Temp:     98.0 degrees F oral Pulse rate:   72 / minute Resp:     14 per minute BP sitting:   130 / 80  (left arm)  Vitals Entered By: Willy Eddy, LPN (March 13, 2010 10:46 AM)  Nutrition Counseling: Patient's BMI is greater than 25 and therefore counseled on weight management options. CC: roa labs, Lipid Management Is Patient Diabetic? No   Primary Care Provider:  Stacie Glaze MD  CC:  roa labs and Lipid Management.  History of Present Illness: lipid management with review of the diet from the nutritionist at [facility] she feels that there are too many carbs in the diet provided she also note increased URI symptoms with exposure to dust in the house form renovations  Hyperlipidemia Follow-Up      This is a 60 year old woman who presents for Hyperlipidemia follow-up.  The patient denies muscle aches, GI upset, abdominal pain, flushing, itching, constipation, diarrhea, and fatigue.  The patient denies the following symptoms: chest pain/pressure, exercise intolerance, dypsnea, palpitations, syncope, and pedal edema.  Compliance with medications (by patient report) has been intermittent.  Dietary compliance has been fair.  The patient reports no exercise.  Adjunctive measures currently used by the patient include weight reduction.    Lipid Management History:      Positive NCEP/ATP III risk factors include female age 70 years old or older.  Negative NCEP/ATP III risk factors include HDL cholesterol greater than 60, no family history for ischemic heart disease, non-tobacco-user status, non-hypertensive, no ASHD (atherosclerotic heart disease), no prior stroke/TIA, no peripheral vascular disease, and no history of aortic aneurysm.     Preventive Screening-Counseling & Management  Alcohol-Tobacco     Smoking Status: never     Passive  Smoke Exposure: no  Problems Prior to Update: 1)  Weight Loss  (ICD-783.21) 2)  Accidental Fall From Bed  (ICD-E884.4) 3)  Acute Ethmoidal Sinusitis  (ICD-461.2) 4)  Irritable Bowel Syndrome  (ICD-564.1) 5)  Obesity, Morbid  (ICD-278.01) 6)  Back Pain, Lumbar, With Radiculopathy  (ICD-724.4) 7)  Hyperlipidemia  (ICD-272.4) 8)  Hormone Replacement Therapy  (ICD-V07.4) 9)  Physical Examination  (ICD-V70.0) 10)  Asthma  (ICD-493.90) 11)  Allergic Rhinitis  (ICD-477.9) 12)  Family History Diabetes 1st Degree Relative  (ICD-V18.0)  Current Problems (verified): 1)  Weight Loss  (ICD-783.21) 2)  Accidental Fall From Bed  (ICD-E884.4) 3)  Acute Ethmoidal Sinusitis  (ICD-461.2) 4)  Irritable Bowel Syndrome  (ICD-564.1) 5)  Obesity, Morbid  (ICD-278.01) 6)  Back Pain, Lumbar, With Radiculopathy  (ICD-724.4) 7)  Hyperlipidemia  (ICD-272.4) 8)  Hormone Replacement Therapy  (ICD-V07.4) 9)  Physical Examination  (ICD-V70.0) 10)  Asthma  (ICD-493.90) 11)  Allergic Rhinitis  (ICD-477.9) 12)  Family History Diabetes 1st Degree Relative  (ICD-V18.0)  Medications Prior to Update: 1)  Crestor 20 Mg Tabs (Rosuvastatin Calcium) .... One By Mouth Weekly On Tuesday Nights 2)  Claritin 10 Mg Tabs (Loratadine) .[name].. 1 Once Daily 3)  Astelin 137 Mcg/spray  Soln (Azelastine Hcl) .... Two Times A Day 4)  Nasonex 50 Mcg/act  Susp (Mometasone Furoate) .... Once Daily 5)  Nabumetone 750 Mg  Tabs (Nabumetone) .... Once Daily 6)  Cymbalta 60 Mg  Cpep (Duloxetine  Hcl) .... 0ne By Mouth Daily 7)  Prilosec 20 Mg  Cpdr (Omeprazole) .... Once Daily 8)  Estradiol 1 Mg Tabs (Estradiol) .[name].. 1 Once Daily 9)  Metamucil Smooth Texture 28.3 %  Powd (Psyllium) .... 2 Tbls Q Am 10)  Allergy Injections .... Weekly 11)  Fish Oil Concentrate 1000 Mg  Caps (Omega-3 Fatty Acids) .... Kril Oil  2 By Mouth Two Times A Day 12)  Phentermine Hcl 37.5 Mg Tabs (Phentermine Hcl) .... One By Mouth Daily 13)  Metoclopramide Hcl 10 Mg  Tabs (Metoclopramide Hcl) .[name].. 1 At Bedtime 14)  Zithromax Z-Pak 250 Mg Tabs (Azithromycin) .... Asdirected  Current Medications (verified): 1)  Crestor 20 Mg Tabs (Rosuvastatin Calcium) .... One By Mouth Weekly On Tuesday Nights 2)  Claritin 10 Mg Tabs (Loratadine) .[name].. 1 Once Daily 3)  Astelin 137 Mcg/spray  Soln (Azelastine Hcl) .... Two Times A Day 4)  Nasonex 50 Mcg/act  Susp (Mometasone Furoate) .... Once Daily 5)  Nabumetone 750 Mg  Tabs (Nabumetone) .... Once Daily 6)  Cymbalta 60 Mg  Cpep (Duloxetine Hcl) .... 0ne By Mouth Daily 7)  Prilosec 20 Mg  Cpdr (Omeprazole) .... Once Daily 8)  Estradiol 1 Mg Tabs (Estradiol) .[name].. 1 Once Daily 9)  Metamucil Smooth Texture 28.3 %  Powd (Psyllium) .... 2 Tbls Q Am 10)  Allergy Injections .... Weekly 11)  Fish Oil Concentrate 1000 Mg  Caps (Omega-3 Fatty Acids) .... Kril Oil  2 By Mouth Two Times A Day 12)  Metoclopramide Hcl 10 Mg Tabs (Metoclopramide Hcl) .[name].. 1 At Bedtime  Allergies (verified): 1)  ! Penicillin 2)  ! Penicillin  Past History:  Family History: Last updated: 08/06/2007 Family History Diabetes 1st degree relative Family History of Cardiovascular disorder  grand mother  Social History: Last updated: 02/12/2007 Married Never Smoked Alcohol use-yes  Risk Factors: Smoking Status: never (03/13/2010) Passive Smoke Exposure: no (03/13/2010)  Past medical, surgical, family and social histories (including risk factors) reviewed, and no changes noted (except as noted below).  Past Medical History: Reviewed history from 08/11/2008 and no changes required. Allergic rhinitis Asthma IBS Hyperlipidemia lumbar disc dz  Past Surgical History: Reviewed history from 02/12/2007 and no changes required. Colonoscopy-04/09/2005 EGD-04/09/2005 Carpal tunnel release-bilat Hysterectomy Oophorectomy Rotator cuff repair-left Splenectomy Breast implantation LASIK eye sx  Family History: Reviewed history from 08/06/2007  and no changes required. Family History Diabetes 1st degree relative Family History of Cardiovascular disorder  grand mother  Social History: Reviewed history from 02/12/2007 and no changes required. Married Never Smoked Alcohol use-yes  Review of Systems  The patient denies anorexia, fever, weight loss, weight gain, vision loss, decreased hearing, hoarseness, chest pain, syncope, dyspnea on exertion, peripheral edema, prolonged cough, headaches, hemoptysis, abdominal pain, melena, hematochezia, severe indigestion/heartburn, hematuria, incontinence, genital sores, muscle weakness, suspicious skin lesions, transient blindness, difficulty walking, depression, unusual weight change, abnormal bleeding, enlarged lymph nodes, angioedema, and breast masses.         Flu Vaccine Consent Questions     Do you have a history of severe allergic reactions to this vaccine? no    Any prior history of allergic reactions to egg and/or gelatin? no    Do you have a sensitivity to the preservative Thimersol? no    Do you have a past history of Guillan-Barre Syndrome? no    Do you currently have an acute febrile illness? no    Have you ever had a severe reaction to latex? no    Vaccine information  given and explained to patient? yes    Are you currently pregnant? no    Lot Number:[id number]   Exp Date:12/22/2010   Site Given  Left Deltoid IM   Physical Exam  General:  overweight-appearing.  alert.   Head:  Normocephalic and atraumatic without obvious abnormalities. No apparent alopecia or balding. Eyes:  pupils equal and pupils round.    allergies Ears:  R ear normal and L ear normal.   Nose:  nasal dischargemucosal pallor.   Mouth:  good dentition and pharynx pink and moist.   Neck:  No deformities, masses, or tenderness noted. Lungs:  normal respiratory effort and no wheezes.   Heart:  normal rate and regular rhythm.   Abdomen:  soft, non-tender, and distended.   Msk:  normal ROM and no joint  tenderness.   Neurologic:  alert & oriented X3 and DTRs symmetrical and normal.     Impression & Recommendations:  Problem # 1:  HYPERLIPIDEMIA (ICD-272.4) Assessment Unchanged one by mouth three times a week to see if we can get the   Her updated medication list for this problem includes:    Crestor 20 Mg Tabs (Rosuvastatin calcium) ..... One by mouth weekly on tuesday nights  Labs Reviewed: SGOT: 22 (03/06/2010)   SGPT: 15 (03/06/2010)  Lipid Goals: Chol Goal: 200 (10/12/2008)   HDL Goal: 40 (10/12/2008)   LDL Goal: 160 (10/12/2008)   TG Goal: 150 (10/12/2008)  Prior 10 Yr Risk Heart Disease: Not enough information (10/12/2008)   HDL:68.20 (03/06/2010), 72.90 (11/21/2009)  LDL:DEL (08/04/2008), 126 (05/03/2008)  Chol:250 (03/06/2010), 243 (11/21/2009)  Trig:97.0 (03/06/2010), 101.0 (11/21/2009)  Problem # 2:  OBESITY, MORBID (ICD-278.01) Assessment: Unchanged weight Ht: 66 (03/13/2010)   Wt: 210 (03/13/2010)   BMI: 34.02 (03/13/2010)  Problem # 3:  ASTHMA (ICD-493.90) stable  Problem # 4:  ALLERGIC RHINITIS (ICD-477.9) increased sympoms with dust in the house Her updated medication list for this problem includes:    Claritin 10 Mg Tabs (Loratadine) .[name]... 1 once daily    Astelin 137 Mcg/spray Soln (Azelastine hcl) .[name]..[name] Two times a day    Nasonex 50 Mcg/act Susp (Mometasone furoate) ..... Once daily  Complete Medication List: 1)  Crestor 20 Mg Tabs (Rosuvastatin calcium) .... One by mouth weekly on tuesday nights 2)  Claritin 10 Mg Tabs (Loratadine) .[name].. 1 once daily 3)  Astelin 137 Mcg/spray Soln (Azelastine hcl) .... Two times a day 4)  Nasonex 50 Mcg/act Susp (Mometasone furoate) .... Once daily 5)  Nabumetone 750 Mg Tabs (Nabumetone) .... Once daily 6)  Cymbalta 60 Mg Cpep (Duloxetine hcl) .... 0ne by mouth daily 7)  Prilosec 20 Mg Cpdr (Omeprazole) .... Once daily 8)  Estradiol 1 Mg Tabs (Estradiol) .[name].. 1 once daily 9)  Metamucil Smooth Texture 28.3 % Powd  (Psyllium) .... 2 tbls q am 10)  Allergy Injections  .... Weekly 11)  Fish Oil Concentrate 1000 Mg Caps (Omega-3 fatty acids) .... Kril oil  2 by mouth two times a day 12)  Metoclopramide Hcl 10 Mg Tabs (Metoclopramide hcl) .[name].. 1 at bedtime  Other Orders: Admin 1st Vaccine (16109) Flu Vaccine 60yrs + (60454)  Lipid Assessment/Plan:      Based on NCEP/ATP III, the patient's risk factor category is "0-1 risk factors".  The patient's lipid goals are as follows: Total cholesterol goal is 200; LDL cholesterol goal is 160; HDL cholesterol goal is 40; Triglyceride goal is 150.    Patient Instructions: 1)  Please schedule a follow-up appointment in  3 months. 2)  Hepatic Panel prior to visit, ICD-9: 995.20 3)  Lipid Panel prior to visit, ICD-9:272.4 Prescriptions: CYMBALTA 60 MG  CPEP (DULOXETINE HCL) 0ne by mouth daily  #30 Capsule x 8   Entered by:   Willy Eddy, LPN   Authorized by:   Stacie Glaze MD   Signed by:   Willy Eddy, LPN on 30/86/5784   Method used:   [facility]ronically to        [phone] (retail)       [address]       [place], [place]  [address]       Ph: [phone]       Fax: [phone]   RxID:   [id number]

## 2010-07-26 NOTE — Miscellaneous (Signed)
Summary: Lec previsit  Clinical Lists Changes  Medications: Added new medication of MOVIPREP 100 GM  SOLR (PEG-KCL-NACL-NASULF-NA ASC-C) As per prep instructions. - Signed Rx of MOVIPREP 100 GM  SOLR (PEG-KCL-NACL-NASULF-NA ASC-C) As per prep instructions.;  #1 x 0;  Signed;  Entered by: Ulis Rias RN;  Authorized by: Hilarie Fredrickson MD;  Method used: [facility] to [facility] [phone]*, [address] [place], [place], [place]  [address], Ph: [phone], Fax: [phone] Allergies: Added new allergy or adverse reaction of PENICILLIN    Prescriptions: MOVIPREP 100 GM  SOLR (PEG-KCL-NACL-NASULF-NA ASC-C) As per prep instructions.  #1 x 0   Entered by:   Ulis Rias RN   Authorized by:   Hilarie Fredrickson MD   Signed by:   Ulis Rias RN on 07/21/2009   Method used:   Electronically to        [facility] [phone][facility] (retail)       [address]       [place], [place]  [address]       Ph: [phone]       Fax: [phone]   RxID:   [id number]

## 2010-07-26 NOTE — Letter (Signed)
Summary: [facility]   Imported By: Maryln Gottron 02/15/2010 09:26:31  _____________________________________________________________________  External Attachment:    Type:   Image     Comment:   External Document

## 2010-07-26 NOTE — Assessment & Plan Note (Signed)
Summary: FLU-SHOT/RCD  Nurse Visit    Current Allergies: ! PENICILLIN   Influenza Vaccine    Vaccine Type: Fluvax Non-MCR    Given by: Alfred Levins, CMA  Flu Vaccine Consent Questions    Do you have a history of severe allergic reactions to this vaccine? no    Any prior history of allergic reactions to egg and/or gelatin? no    Do you have a sensitivity to the preservative Thimersol? no    Do you have a past history of Guillan-Barre Syndrome? no    Do you currently have an acute febrile illness? no    Have you ever had a severe reaction to latex? no    Vaccine information given and explained to patient? yes    Are you currently pregnant? no   Impression & Recommendations:  lot [id number], EXP 30 jun 09, sanofi pasteur left deltoid IM, 0.5 cc.   Orders Added: 1)  Influenza Vaccine NON MCR [00028]    ]

## 2010-07-26 NOTE — Progress Notes (Signed)
Summary: sinus headache  Phone Note Call from Patient   Caller: Patient Call For: Stacie Glaze MD Reason for Call: Acute Illness Summary of Call: Pt. is still having left sided sinus headache and has not finished Allerx but finished Zpack on Saturday.  Other symptoms are slowly getting better.  Advise the meds are still working, but will ask Dr. Lovell Sheehan if he wants to change anything and call back if he does. [phone] Initial call taken by: Lynann Beaver CMA,  August 23, 2009 4:07 PM  Follow-up for Phone Call        use warm ,wet compress across sinus and wear hat when out side Follow-up by: Willy Eddy, LPN,  August 23, 2009 4:11 PM

## 2010-07-26 NOTE — Assessment & Plan Note (Signed)
Summary: 2 mo rov/mm   Vital Signs:  Patient profile:   60 year old female Height:      66 inches Weight:      224 pounds BMI:     36.29 Temp:     98.2 degrees F oral Pulse rate:   76 / minute Resp:     14 per minute BP sitting:   140 / 80  (left arm)  Vitals Entered By: Willy Eddy, LPN (December 15, 2008 10:25 AM)  CC:  roa- lost 10 pounds!!.  History of Present Illness: weight loss and hyperlipidemia asthma is stable  Follow-Up Visit      This is a 60 year old woman who presents for Follow-up visit.  The patient denies chest pain, palpitations, dizziness, syncope, low blood sugar symptoms, high blood sugar symptoms, edema, SOB, DOE, PND, and orthopnea.  Since the last visit the patient notes no new problems or concerns.  The patient reports taking meds as prescribed.  When questioned about possible medication side effects, the patient notes none.    Problems Prior to Update: 1)  Irritable Bowel Syndrome  (ICD-564.1) 2)  Obesity, Morbid  (ICD-278.01) 3)  Back Pain, Lumbar, With Radiculopathy  (ICD-724.4) 4)  Hyperlipidemia  (ICD-272.4) 5)  Hormone Replacement Therapy  (ICD-V07.4) 6)  Physical Examination  (ICD-V70.0) 7)  Asthma  (ICD-493.90) 8)  Allergic Rhinitis  (ICD-477.9) 9)  Family History Diabetes 1st Degree Relative  (ICD-V18.0)  Medications Prior to Update: 1)  Zetia 10 Mg  Tabs (Ezetimibe) .... Once Daily 2)  Claritin-D 24 Hour 10-240 Mg  Tb24 (Loratadine-Pseudoephedrine) .... Once Daily 3)  Astelin 137 Mcg/spray  Soln (Azelastine Hcl) .... Once Daily 4)  Nasonex 50 Mcg/act  Susp (Mometasone Furoate) .... Once Daily 5)  Nabumetone 750 Mg  Tabs (Nabumetone) .... Once Daily 6)  Cymbalta 60 Mg  Cpep (Duloxetine Hcl) .... 0ne By Mouth Daily 7)  Prilosec 20 Mg  Cpdr (Omeprazole) .... Once Daily 8)  Levsin 0.125 Mg  Tabs (Hyoscyamine Sulfate) .... At Bedtime 9)  Estradiol 1 Mg Tabs (Estradiol) .[name].. 1 Once Daily 10)  Metamucil Smooth Texture 28.3 %  Powd (Psyllium)  .... 2 Tbls Q Am 11)  Allergy Injections .... Weekly 12)  Fish Oil Concentrate 1000 Mg  Caps (Omega-3 Fatty Acids) .... Kril Oil  2 By Mouth Two Times A Day 13)  Metoclopramide Hcl 10 Mg Tabs (Metoclopramide Hcl) .[name].. 1 At Bedtime 14)  Phentermine Hcl 37.5 Mg Tabs (Phentermine Hcl) .... One By Mouth Daily  Current Medications (verified): 1)  Zetia 10 Mg  Tabs (Ezetimibe) .... Once Daily 2)  Claritin-D 24 Hour 10-240 Mg  Tb24 (Loratadine-Pseudoephedrine) .... Once Daily 3)  Astelin 137 Mcg/spray  Soln (Azelastine Hcl) .... Once Daily 4)  Nasonex 50 Mcg/act  Susp (Mometasone Furoate) .... Once Daily 5)  Nabumetone 750 Mg  Tabs (Nabumetone) .... Once Daily 6)  Cymbalta 60 Mg  Cpep (Duloxetine Hcl) .... 0ne By Mouth Daily 7)  Prilosec 20 Mg  Cpdr (Omeprazole) .... Once Daily 8)  Levsin 0.125 Mg  Tabs (Hyoscyamine Sulfate) .... At Bedtime 9)  Estradiol 1 Mg Tabs (Estradiol) .[name].. 1 Once Daily 10)  Metamucil Smooth Texture 28.3 %  Powd (Psyllium) .... 2 Tbls Q Am 11)  Allergy Injections .... Weekly 12)  Fish Oil Concentrate 1000 Mg  Caps (Omega-3 Fatty Acids) .... Kril Oil  2 By Mouth Two Times A Day 13)  Phentermine Hcl 37.5 Mg Tabs (Phentermine Hcl) .... One By  Mouth Daily  Allergies (verified): 1)  ! Penicillin  Past History:  Family History: Last updated: 08/06/2007 Family History Diabetes 1st degree relative Family History of Cardiovascular disorder  grand mother  Social History: Last updated: 02/12/2007 Married Never Smoked Alcohol use-yes  Risk Factors: Smoking Status: never (02/12/2007) Passive Smoke Exposure: no (08/06/2007)  Past medical, surgical, family and social histories (including risk factors) reviewed, and no changes noted (except as noted below).  Past Medical History: Reviewed history from 08/11/2008 and no changes required. Allergic rhinitis Asthma IBS Hyperlipidemia lumbar disc dz  Past Surgical History: Reviewed history from 02/12/2007 and no  changes required. Colonoscopy-04/09/2005 EGD-04/09/2005 Carpal tunnel release-bilat Hysterectomy Oophorectomy Rotator cuff repair-left Splenectomy Breast implantation LASIK eye sx  Family History: Reviewed history from 08/06/2007 and no changes required. Family History Diabetes 1st degree relative Family History of Cardiovascular disorder  grand mother  Social History: Reviewed history from 02/12/2007 and no changes required. Married Never Smoked Alcohol use-yes  Review of Systems  The patient denies anorexia, fever, weight loss, weight gain, vision loss, decreased hearing, hoarseness, chest pain, syncope, dyspnea on exertion, peripheral edema, prolonged cough, headaches, hemoptysis, abdominal pain, melena, hematochezia, severe indigestion/heartburn, hematuria, incontinence, genital sores, muscle weakness, suspicious skin lesions, transient blindness, difficulty walking, depression, unusual weight change, abnormal bleeding, enlarged lymph nodes, angioedema, and breast masses.    Physical Exam  General:  overweight-appearing.  alert.   Eyes:  pupils equal and pupils round.    allergies Nose:  nasal dischargemucosal pallor.   Mouth:  pharyngeal exudate and posterior lymphoid hypertrophy.   Neck:  No deformities, masses, or tenderness noted. Lungs:  normal respiratory effort, no accessory muscle use, and no wheezes.   Heart:  normal rate, regular rhythm, and no murmur.   Abdomen:  Bowel sounds positive,abdomen soft and non-tender without masses, organomegaly or hernias noted. Msk:  decreased ROM and joint tenderness.   Extremities:  trace left pedal edema and trace right pedal edema.   Neurologic:  alert & oriented X3 and finger-to-nose normal.     Impression & Recommendations:  Problem # 1:  OBESITY, MORBID (ICD-278.01)  LOST 10 POUNDS WITH THE PHENTERIMINE HAVING CONSTIPATION WITH ibs  Ht: 66 (12/15/2008)   Wt: 224 (12/15/2008)   BMI: 36.29 (12/15/2008)  Problem # 2:   IRRITABLE BOWEL SYNDROME (ICD-564.1) INCREASED FIFER IN DIET  Problem # 3:  ALLERGIC RHINITIS (ICD-477.9)  Her updated medication list for this problem includes:    Astelin 137 Mcg/spray Soln (Azelastine hcl) ..... Once daily    Nasonex 50 Mcg/act Susp (Mometasone furoate) ..... Once daily  Discussed use of allergy medications and environmental measures.   Complete Medication List: 1)  Zetia 10 Mg Tabs (Ezetimibe) .... Once daily 2)  Claritin-d 24 Hour 10-240 Mg Tb24 (Loratadine-pseudoephedrine) .... Once daily 3)  Astelin 137 Mcg/spray Soln (Azelastine hcl) .... Once daily 4)  Nasonex 50 Mcg/act Susp (Mometasone furoate) .... Once daily 5)  Nabumetone 750 Mg Tabs (Nabumetone) .... Once daily 6)  Cymbalta 60 Mg Cpep (Duloxetine hcl) .... 0ne by mouth daily 7)  Prilosec 20 Mg Cpdr (Omeprazole) .... Once daily 8)  Levsin 0.125 Mg Tabs (Hyoscyamine sulfate) .... At bedtime 9)  Estradiol 1 Mg Tabs (Estradiol) .[name].. 1 once daily 10)  Metamucil Smooth Texture 28.3 % Powd (Psyllium) .... 2 tbls q am 11)  Allergy Injections  .... Weekly 12)  Fish Oil Concentrate 1000 Mg Caps (Omega-3 fatty acids) .... Kril oil  2 by mouth two times a day 13)  Phentermine Hcl 37.5 Mg Tabs (Phentermine hcl) .... One by mouth daily  Patient Instructions: 1)  HIGH FIBER CERIALS 2)  kASHI FIBER 3)  UNCLE SAM FLAX CERIAL 4)  Please schedule a follow-up appointment in 3 months. Prescriptions: PHENTERMINE HCL 37.5 MG TABS (PHENTERMINE HCL) one by mouth daily  #30 x 3   Entered and Authorized by:   Stacie Glaze MD   Signed by:   Stacie Glaze MD on 12/15/2008   Method used:   Print then Give to Patient   RxID:   [id number]

## 2010-07-26 NOTE — Consult Note (Signed)
Summary: [facility]  [facility]   Imported By: Maryln Gottron 09/06/2008 14:51:10  _____________________________________________________________________  External Attachment:    Type:   Image     Comment:   External Document

## 2010-07-26 NOTE — Assessment & Plan Note (Signed)
Summary: 2 month rov/njr   Vital Signs:  Patient profile:   60 year old female Height:      66 inches Weight:      234 pounds BMI:     37.91 Temp:     98.3 degrees F oral Pulse rate:   80 / minute Resp:     14 per minute BP sitting:   136 / 80  (left arm)  Vitals Entered By: Willy Eddy, LPN (October 12, 2008 11:26 AM)  Nutrition Counseling: Patient's BMI is greater than 25 and therefore counseled on weight management options.  CC:  roa labs.  History of Present Illness: pollen causing increased allergy symptoms diagnosis of morbit obesity and 30 min if cousilling in  additon to exam today  Lipid Management History:      Positive NCEP/ATP III risk factors include female age 26 years old or older.  Negative NCEP/ATP III risk factors include HDL cholesterol greater than 60, no family history for ischemic heart disease, non-tobacco-user status, non-hypertensive, no ASHD (atherosclerotic heart disease), no prior stroke/TIA, no peripheral vascular disease, and no history of aortic aneurysm.     Problems Prior to Update: 1)  Back Pain, Lumbar, With Radiculopathy  (ICD-724.4) 2)  Hyperlipidemia  (ICD-272.4) 3)  Hormone Replacement Therapy  (ICD-V07.4) 4)  Physical Examination  (ICD-V70.0) 5)  Asthma  (ICD-493.90) 6)  Allergic Rhinitis  (ICD-477.9) 7)  Family History Diabetes 1st Degree Relative  (ICD-V18.0)  Medications Prior to Update: 1)  Zetia 10 Mg  Tabs (Ezetimibe) .... Once Daily 2)  Claritin-D 24 Hour 10-240 Mg  Tb24 (Loratadine-Pseudoephedrine) .... Once Daily 3)  Astelin 137 Mcg/spray  Soln (Azelastine Hcl) .... Once Daily 4)  Nasonex 50 Mcg/act  Susp (Mometasone Furoate) .... Once Daily 5)  Nabumetone 750 Mg  Tabs (Nabumetone) .... Once Daily 6)  Cymbalta 60 Mg  Cpep (Duloxetine Hcl) .... 0ne By Mouth Daily 7)  Prilosec 20 Mg  Cpdr (Omeprazole) .... Once Daily 8)  Levsin 0.125 Mg  Tabs (Hyoscyamine Sulfate) .... At Bedtime 9)  Estradiol 1 Mg Tabs (Estradiol) .[name]..  1 Once Daily 10)  Metamucil Smooth Texture 28.3 %  Powd (Psyllium) .... 2 Tbls Q Am 11)  Allergy Injections .... Weekly 12)  Fish Oil Concentrate 1000 Mg  Caps (Omega-3 Fatty Acids) .... Kril Oil  2 By Mouth Two Times A Day 13)  Metoclopramide Hcl 10 Mg Tabs (Metoclopramide Hcl) .[name].. 1 At Bedtime  Current Medications (verified): 1)  Zetia 10 Mg  Tabs (Ezetimibe) .... Once Daily 2)  Claritin-D 24 Hour 10-240 Mg  Tb24 (Loratadine-Pseudoephedrine) .... Once Daily 3)  Astelin 137 Mcg/spray  Soln (Azelastine Hcl) .... Once Daily 4)  Nasonex 50 Mcg/act  Susp (Mometasone Furoate) .... Once Daily 5)  Nabumetone 750 Mg  Tabs (Nabumetone) .... Once Daily 6)  Cymbalta 60 Mg  Cpep (Duloxetine Hcl) .... 0ne By Mouth Daily 7)  Prilosec 20 Mg  Cpdr (Omeprazole) .... Once Daily 8)  Levsin 0.125 Mg  Tabs (Hyoscyamine Sulfate) .... At Bedtime 9)  Estradiol 1 Mg Tabs (Estradiol) .[name].. 1 Once Daily 10)  Metamucil Smooth Texture 28.3 %  Powd (Psyllium) .... 2 Tbls Q Am 11)  Allergy Injections .... Weekly 12)  Fish Oil Concentrate 1000 Mg  Caps (Omega-3 Fatty Acids) .... Kril Oil  2 By Mouth Two Times A Day 13)  Metoclopramide Hcl 10 Mg Tabs (Metoclopramide Hcl) .[name].. 1 At Bedtime  Allergies (verified): 1)  ! Penicillin  Past History:  Family  History:    Family History Diabetes 1st degree relative    Family History of Cardiovascular disorder  grand mother     (08/06/2007)  Social History:    Married    Never Smoked    Alcohol use-yes     (02/12/2007)  Risk Factors:    Alcohol Use: N/A    >5 drinks/d w/in last 3 months: N/A    Caffeine Use: N/A    Diet: N/A    Exercise: N/A  Risk Factors:    Smoking Status: never (02/12/2007)    Packs/Day: N/A    Cigars/wk: N/A    Pipe Use/wk: N/A    Cans of tobacco/wk: N/A    Passive Smoke Exposure: no (08/06/2007)  Past medical, surgical, family and social histories (including risk factors) reviewed, and no changes noted (except as noted below).  Past  Medical History:    Reviewed history from 08/11/2008 and no changes required:    Allergic rhinitis    Asthma    IBS    Hyperlipidemia    lumbar disc dz  Past Surgical History:    Reviewed history from 02/12/2007 and no changes required:    Colonoscopy-04/09/2005    EGD-04/09/2005    Carpal tunnel release-bilat    Hysterectomy    Oophorectomy    Rotator cuff repair-left    Splenectomy    Breast implantation    LASIK eye sx  Family History:    Reviewed history from 08/06/2007 and no changes required:       Family History Diabetes 1st degree relative       Family History of Cardiovascular disorder  grand mother  Social History:    Reviewed history from 02/12/2007 and no changes required:       Married       Never Smoked       Alcohol use-yes  Review of Systems  The patient denies anorexia, fever, weight loss, weight gain, vision loss, decreased hearing, hoarseness, chest pain, syncope, dyspnea on exertion, peripheral edema, prolonged cough, headaches, hemoptysis, abdominal pain, melena, hematochezia, severe indigestion/heartburn, hematuria, incontinence, genital sores, muscle weakness, suspicious skin lesions, transient blindness, difficulty walking, depression, unusual weight change, abnormal bleeding, enlarged lymph nodes, angioedema, and breast masses.    Physical Exam  General:  overweight-appearing.  alert.   Eyes:  pupils equal and pupils round.    allergies Ears:  R ear normal and L ear normal.   Nose:  nasal dischargemucosal pallor.   Mouth:  pharyngeal exudate and posterior lymphoid hypertrophy.   Neck:  No deformities, masses, or tenderness noted. Lungs:  normal respiratory effort, no accessory muscle use, and no wheezes.   Heart:  normal rate, regular rhythm, and no murmur.     Impression & Recommendations:  Problem # 1:  OBESITY, MORBID (ICD-278.01) phentermine  and dash diet Ht: 66 (10/12/2008)   Wt: 234 (10/12/2008)   BMI: 37.91 (10/12/2008)  Problem #  2:  HYPERLIPIDEMIA (ICD-272.4)  Her updated medication list for this problem includes:    Zetia 10 Mg Tabs (Ezetimibe) ..... Once daily  Labs Reviewed: SGOT: 20 (08/04/2008)   SGPT: 23 (08/04/2008)   HDL:83.7 (08/04/2008), 55.5 (05/03/2008)  LDL:DEL (08/04/2008), 126 (05/03/2008)  Chol:261 (08/04/2008), 200 (05/03/2008)  Trig:97 (08/04/2008), 93 (05/03/2008)  Problem # 3:  ASTHMA (ICD-493.90) weight loss to reduce restrictive dz  Complete Medication List: 1)  Zetia 10 Mg Tabs (Ezetimibe) .... Once daily 2)  Claritin-d 24 Hour 10-240 Mg Tb24 (Loratadine-pseudoephedrine) .... Once daily 3)  Astelin 137  Mcg/spray Soln (Azelastine hcl) .... Once daily 4)  Nasonex 50 Mcg/act Susp (Mometasone furoate) .... Once daily 5)  Nabumetone 750 Mg Tabs (Nabumetone) .... Once daily 6)  Cymbalta 60 Mg Cpep (Duloxetine hcl) .... 0ne by mouth daily 7)  Prilosec 20 Mg Cpdr (Omeprazole) .... Once daily 8)  Levsin 0.125 Mg Tabs (Hyoscyamine sulfate) .... At bedtime 9)  Estradiol 1 Mg Tabs (Estradiol) .[name].. 1 once daily 10)  Metamucil Smooth Texture 28.3 % Powd (Psyllium) .... 2 tbls q am 11)  Allergy Injections  .... Weekly 12)  Fish Oil Concentrate 1000 Mg Caps (Omega-3 fatty acids) .... Kril oil  2 by mouth two times a day 13)  Metoclopramide Hcl 10 Mg Tabs (Metoclopramide hcl) .[name].. 1 at bedtime 14)  Phentermine Hcl 37.5 Mg Tabs (Phentermine hcl) .... One by mouth daily  Lipid Assessment/Plan:      Based on NCEP/ATP III, the patient's risk factor category is "0-1 risk factors".  From this information, the patient's calculated lipid goals are as follows: Total cholesterol goal is 200; LDL cholesterol goal is 160; HDL cholesterol goal is 40; Triglyceride goal is 150.    Patient Instructions: 1)  [email or web address] 2)  follow the dash diet and take the phentermine daily in the AM Prescriptions: PHENTERMINE HCL 37.5 MG TABS (PHENTERMINE HCL) one by mouth daily  #30 x 3   Entered and Authorized by:   Stacie Glaze MD   Signed by:   Stacie Glaze MD on 10/12/2008   Method used:   Print then Give to Patient   RxID:   [id number]

## 2010-07-26 NOTE — Progress Notes (Signed)
  Phone Note Call from Patient   Caller: Patient Call For: Stacie Glaze MD Summary of Call: Pt is calling to ask for RX for sinus symptoms........teeth hurt, and post nasal drainage (green), and sneezing.  [facility] [place])  Initial call taken by: Lynann Beaver CMA,  August 15, 2009 3:31 PM  Follow-up for Phone Call        zpack and allerx dose pk Follow-up by: Stacie Glaze MD,  August 15, 2009 5:28 PM    New/Updated Medications: Selinda Eon DOSE PACK  MISC (PSE-METHSCOP & CPM-PE-METHSCOP) as directed ZITHROMAX Z-PAK 250 MG TABS (AZITHROMYCIN) as directed Prescriptions: ZITHROMAX Z-PAK 250 MG TABS (AZITHROMYCIN) as directed  #6 x 0   Entered by:   Lynann Beaver CMA   Authorized by:   Stacie Glaze MD   Signed by:   Lynann Beaver CMA on 08/15/2009   Method used:   Electronically to        [facility] [phone]* (retail)       [address]       [place], [place]  [address]       Ph: [phone]       Fax: [phone]   RxID:   [id number] Selinda Eon DOSE PACK  MISC (PSE-METHSCOP & CPM-PE-METHSCOP) as directed  #1pack x 0   Entered by:   Lynann Beaver CMA   Authorized by:   Stacie Glaze MD   Signed by:   Lynann Beaver CMA on 08/15/2009   Method used:   Electronically to        [facility] [phone]* (retail)       [address]       [place], [place]  [address]       Ph: [phone]       Fax: [phone]   RxID:   [id number]  Pt. notified.

## 2010-07-26 NOTE — Procedures (Signed)
Summary: Colonoscopy  Patient: [name] Note: All result statuses are Final unless otherwise noted.  Tests: (1) Colonoscopy (COL)   COL Colonoscopy           DONE     [facility]     [address]     [place], [place]  [address]           COLONOSCOPY PROCEDURE REPORT           PATIENT:  [name], [name]  MR#:  [id number]     BIRTHDATE:  04/06/1951, 58 yrs. old  GENDER:  female           ENDOSCOPIST:  Wilhemina Bonito. Eda Keys, MD     Referred by:  Screening Recall           PROCEDURE DATE:  08/04/2009     PROCEDURE:  Colonoscopy with snare polypectomy X 2     ASA CLASS:  Class II     INDICATIONS:  Routine Risk Screening           MEDICATIONS:   Fentanyl 75 mcg IV, Versed 8 mg IV, Benadryl 12.5     mg IV           DESCRIPTION OF PROCEDURE:   After the risks benefits and     alternatives of the procedure were thoroughly explained, informed     consent was obtained.  Digital rectal exam was performed and     revealed no abnormalities.   The LB [id number] endoscope     was introduced through the anus and advanced to the cecum, which     was identified by both the appendix and ileocecal valve, without     limitations.TIME TO CECUM = 5:42 MIN. The quality of the prep was     good, using MoviPrep.  The instrument was then slowly withdrawn     (TIME = 17:14 MIN)as the colon was fully examined.     <<PROCEDUREIMAGES>>           FINDINGS:  Two polyps (1MM,4MM) were found in the proximal     transverse colon. Polyps were snared without cautery. Retrieval     was successful. snare polyp  This was otherwise a normal     examination of the colon.   Retroflexed views in the rectum     revealed no abnormalities.    The scope was then withdrawn from     the patient and the procedure completed.           COMPLICATIONS:  None           ENDOSCOPIC IMPRESSION:     1) Two polyps in the proximal transverse colon -REMOVED     2) Otherwise normal examination        RECOMMENDATIONS:     1) Follow up colonoscopy in 5 years           ______________________________     Wilhemina Bonito. Eda Keys, MD           CC:  Stacie Glaze, MD;The Patient           n.     Rosalie DoctorWilhemina Bonito. Eda Keys at 08/04/2009 11:25 AM           Evonnie Pat, [id number]  Note: An exclamation mark (!) indicates a result that was not dispersed into the flowsheet. Document Creation Date: 08/04/2009 11:26 AM _______________________________________________________________________  (1) Order result status: Final Collection or observation date-time:  08/04/2009 11:16 Requested date-time:  Receipt date-time:  Reported date-time:  Referring Physician:   Ordering Physician: Fransico Setters [phone]) Specimen Source:  Source: Launa Grill Order Number: [id number] Lab site:   Appended Document: Colonoscopy recall in 5 yrs/2016/aw     Procedures Next Due Date:    Colonoscopy: 07/2014

## 2010-07-26 NOTE — Assessment & Plan Note (Signed)
Summary: ?SINUS INF/NJR   Vital Signs:  Patient profile:   60 year old female Temp:     98.2 degrees F oral Pulse rate:   76 / minute Resp:     14 per minute BP sitting:   130 / 80  (left arm)  Vitals Entered By: Willy Eddy, LPN (April 20, 2009 12:47 PM)  CC:  c/o sinusitis like sx and URI symptoms.  History of Present Illness:  URI Symptoms      This is a 60 year old woman who presents with URI symptoms.  The patient reports nasal congestion and purulent nasal discharge, but denies clear nasal discharge, sore throat, dry cough, productive cough, earache, and sick contacts.  The patient denies fever, low-grade fever (<100.5 degrees), fever of 100.5-103 degrees, fever of 103.1-104 degrees, fever to >104 degrees, stiff neck, dyspnea, wheezing, rash, vomiting, diarrhea, use of an antipyretic, and response to antipyretic.  The patient also reports headache and muscle aches.  Risk factors for Strep sinusitis include unilateral facial pain.    Allergies: 1)  ! Penicillin   Impression & Recommendations:  Problem # 1:  ACUTE ETHMOIDAL SINUSITIS (ICD-461.2)  Her updated medication list for this problem includes:    Claritin-d 24 Hour 10-240 Mg Tb24 (Loratadine-pseudoephedrine) ..... Once daily    Astelin 137 Mcg/spray Soln (Azelastine hcl) .[name]..[name] Two times a day    Nasonex 50 Mcg/act Susp (Mometasone furoate) ..... Once daily    Allerx Dose Pack Misc (Pse-methscop & cpm-pe-methscop) ..... Use one by mouth two times a day as directed for 10 days    Clarithromycin 500 Mg Xr24h-tab (Clarithromycin) ..... One by mouth two times a day  Instructed on treatment. Call if symptoms persist or worsen.   Complete Medication List: 1)  Zetia 10 Mg Tabs (Ezetimibe) .... Once daily 2)  Claritin-d 24 Hour 10-240 Mg Tb24 (Loratadine-pseudoephedrine) .... Once daily 3)  Astelin 137 Mcg/spray Soln (Azelastine hcl) .... Two times a day 4)  Nasonex 50 Mcg/act Susp (Mometasone furoate) .... Once  daily 5)  Nabumetone 750 Mg Tabs (Nabumetone) .... Once daily 6)  Cymbalta 60 Mg Cpep (Duloxetine hcl) .... 0ne by mouth daily 7)  Prilosec 20 Mg Cpdr (Omeprazole) .... Once daily 8)  Estradiol 1 Mg Tabs (Estradiol) .[name].. 1 once daily 9)  Metamucil Smooth Texture 28.3 % Powd (Psyllium) .... 2 tbls q am 10)  Allergy Injections  .... Weekly 11)  Fish Oil Concentrate 1000 Mg Caps (Omega-3 fatty acids) .... Kril oil  2 by mouth two times a day 12)  Phentermine Hcl 37.5 Mg Tabs (Phentermine hcl) .... One by mouth daily 13)  Metoclopramide Hcl 10 Mg Tabs (Metoclopramide hcl) .[name].. 1 at bedtime 14)  Allerx Dose Pack Misc (Pse-methscop & cpm-pe-methscop) .... Use one by mouth two times a day as directed for 10 days 15)  Clarithromycin 500 Mg Xr24h-tab (Clarithromycin) .... One by mouth two times a day Prescriptions: CLARITHROMYCIN 500 MG XR24H-TAB (CLARITHROMYCIN) one by mouth two times a day  #20 x 0   Entered and Authorized by:   Stacie Glaze MD   Signed by:   Stacie Glaze MD on 04/20/2009   Method used:   [facility] to        [facility] [phone]* (retail)       [address]       [place], [place]  [address]       Ph: [phone]  Fax: [phone]   RxID:   [id number] HYQMVH DOSE PACK  MISC (PSE-METHSCOP & CPM-PE-METHSCOP) use one by mouth two times a day as directed for 10 days  #20 x 0   Entered and Authorized by:   Stacie Glaze MD   Signed by:   Stacie Glaze MD on 04/20/2009   Method used:   Electronically to        [facility] [phone]* (retail)       [address]       [place], [place]  [address]       Ph: [phone]       Fax: [phone]   RxID:   [id number]

## 2010-07-26 NOTE — Assessment & Plan Note (Signed)
Summary: CPX/NO PAP/NJR   Vital Signs:  Patient Profile:   60 Years Old Female Height:     66 inches Weight:      232 pounds Temp:     98.5 degrees F oral Pulse rate:   76 / minute Resp:     14 per minute BP sitting:   130 / 80  (left arm)  Vitals Entered By: Willy Eddy, LPN (August 11, 2008 10:47 AM)                  Chief Complaint:  cx- ekg in emr.  History of Present Illness: CPX increased amonts of steroids due to back nerve pain     Current Allergies: ! PENICILLIN  Past Medical History:    Reviewed history from 02/09/2008 and no changes required:       Allergic rhinitis       Asthma       IBS       Hyperlipidemia       lumbar disc dz  Past Surgical History:    Reviewed history from 02/12/2007 and no changes required:       Colonoscopy-04/09/2005       EGD-04/09/2005       Carpal tunnel release-bilat       Hysterectomy       Oophorectomy       Rotator cuff repair-left       Splenectomy       Breast implantation       LASIK eye sx   Family History:    Reviewed history from 08/06/2007 and no changes required:       Family History Diabetes 1st degree relative       Family History of Cardiovascular disorder  grand mother  Social History:    Reviewed history from 02/12/2007 and no changes required:       Married       Never Smoked       Alcohol use-yes   Risk Factors: Tobacco use:  never Passive smoke exposure:  no Drug use:  no HIV high-risk behavior:  no Alcohol use:  yes  Family History Risk Factors:    Family History of MI in females < 14 years old:  no    Family History of MI in males < 70 years old:  no  Mammogram History:     Date of Last Mammogram:  07/07/2008    Results:  normal   PAP Smear History:     Date of Last PAP Smear:  04/09/2005    Results:  normal  (02/10/2008 11:06) Removed observation of COLONOSCOPY: repeat in 2016   Colonoscopy History:     Date of Last Colonoscopy:  06/02/2003    Results:  normal      Review of Systems  The patient denies anorexia, fever, weight loss, weight gain, vision loss, decreased hearing, hoarseness, chest pain, syncope, dyspnea on exertion, peripheral edema, prolonged cough, headaches, hemoptysis, abdominal pain, melena, hematochezia, severe indigestion/heartburn, hematuria, incontinence, genital sores, muscle weakness, suspicious skin lesions, transient blindness, difficulty walking, depression, unusual weight change, abnormal bleeding, enlarged lymph nodes, angioedema, and breast masses.     Physical Exam  General:     overweight-appearing.  alert.   Head:     Normocephalic and atraumatic without obvious abnormalities. No apparent alopecia or balding. Eyes:     pupils equal and pupils round.    allergies Ears:     R ear normal and L ear normal.  Nose:     nasal dischargemucosal pallor.   Mouth:     pharyngeal exudate and posterior lymphoid hypertrophy.   Neck:     No deformities, masses, or tenderness noted. Lungs:     normal respiratory effort, no accessory muscle use, and no wheezes.   Heart:     normal rate, regular rhythm, and no murmur.   Abdomen:     Bowel sounds positive,abdomen soft and non-tender without masses, organomegaly or hernias noted. Msk:     decreased ROM and joint tenderness.   Pulses:     R and L carotid,radial,femoral,dorsalis pedis and posterior tibial pulses are full and equal bilaterally Extremities:     1+ left pedal edema and 1+ right pedal edema.   Neurologic:     alert & oriented X3 and finger-to-nose normal.   Skin:     turgor normal and color normal.   Cervical Nodes:     No lymphadenopathy noted Axillary Nodes:     No palpable lymphadenopathy Inguinal Nodes:     No significant adenopathy Psych:     Cognition and judgment appear intact. Alert and cooperative with normal attention span and concentration. No apparent delusions, illusions, hallucinations    Impression & Recommendations:  Problem # 1:   PHYSICAL EXAMINATION (ICD-V70.0) the weigth gain and the  steroids have put pt at risk for addm LAST EPIDURAL WAS 2 WEEKS AGO  Problem # 2:  HYPERLIPIDEMIA (ICD-272.4)  Her updated medication list for this problem includes:    Zetia 10 Mg Tabs (Ezetimibe) ..... Once daily  Labs Reviewed: Chol: 261 (08/04/2008)   HDL: 83.7 (08/04/2008)   LDL: 165.7 (08/04/2008)   TG: 97 (08/04/2008) SGOT: 20 (08/04/2008)   SGPT: 23 (08/04/2008)   Problem # 3:  ASTHMA (ICD-493.90) Assessment: Unchanged stable  Problem # 4:  HORMONE REPLACEMENT THERAPY (ICD-V07.4) Assessment: Unchanged for hot flashes  Problem # 5:  BACK PAIN, LUMBAR, WITH RADICULOPATHY (ICD-724.4) two epidurals and referra;l to [facility] for evaluaton Her updated medication list for this problem includes:    Nabumetone 750 Mg Tabs (Nabumetone) ..... Once daily Discussed use of moist heat or ice, modified activities, medications, and stretching/strengthening exercises. Back care instructions given. To be seen in 2 weeks if no improvement; sooner if worsening of symptoms.   Complete Medication List: 1)  Zetia 10 Mg Tabs (Ezetimibe) .... Once daily 2)  Claritin-d 24 Hour 10-240 Mg Tb24 (Loratadine-pseudoephedrine) .... Once daily 3)  Astelin 137 Mcg/spray Soln (Azelastine hcl) .... Once daily 4)  Nasonex 50 Mcg/act Susp (Mometasone furoate) .... Once daily 5)  Nabumetone 750 Mg Tabs (Nabumetone) .... Once daily 6)  Cymbalta 60 Mg Cpep (Duloxetine hcl) .... 0ne by mouth daily 7)  Prilosec 20 Mg Cpdr (Omeprazole) .... Once daily 8)  Levsin 0.125 Mg Tabs (Hyoscyamine sulfate) .... At bedtime 9)  Estradiol 1 Mg Tabs (Estradiol) .[name].. 1 once daily 10)  Metamucil Smooth Texture 28.3 % Powd (Psyllium) .... 2 tbls q am 11)  Allergy Injections  .... Weekly 12)  Fish Oil Concentrate 1000 Mg Caps (Omega-3 fatty acids) .... Kril oil  2 by mouth two times a day 13)  Metoclopramide Hcl 10 Mg Tabs (Metoclopramide hcl) .[name].. 1 at  bedtime  Other Orders: Neurosurgeon Referral (Neurosurgeon)   Patient Instructions: 1)  you will be referred to Clydell Hakim to [facility] the disc 2)  Please schedule a follow-up appointment in 2 months. 3)  HbgA1C prior to visit, ICD-9: 4)  You need to lose weight.  Consider a lower calorie diet and regular exercise.        Preventive Care Screening  Last Flu Shot:    Next Due:  04/15/2008  Last Tetanus Booster:    Next Due:  08/05/2017  Colonoscopy:    Date:  06/02/2003    Next Due:  05/2010    Results:  normal   Pap Smear:    Date:  02/10/2008    Next Due:  02/2009    Results:  normal   Mammogram:    Date:  07/07/2008    Next Due:  06/2009    Results:  normal

## 2010-07-26 NOTE — Progress Notes (Signed)
Summary: FYI  Phone Note Call from Patient   Caller: Patient Call For: Stacie Glaze MD Summary of Call: Wants to let Dr. Lovell Sheehan know she was diagnosed with cholethiasis at [facility] ER and will be having surgery in the near future.   The surgeon may be calling Dr. Lovell Sheehan. Initial call taken by: Lynann Beaver CMA AAMA,  June 29, 2010 2:11 PM  Follow-up for Phone Call        dr Lovell Sheehan is aware Follow-up by: Willy Eddy, LPN,  June 29, 2010 2:16 PM

## 2010-07-26 NOTE — Progress Notes (Signed)
Summary: name of nutrionist  Phone Note Call from Patient   Caller: Patient Call For: Stacie Glaze MD Reason for Call: Acute Illness Summary of Call: [phone] Pt wants to know the name of a good nutrionist for weight loss. Initial call taken by: Lynann Beaver CMA,  December 07, 2009 10:51 AM  Follow-up for Phone Call        would you like to go to [facility]- we can send a referral if you would like? Follow-up by: Willy Eddy, LPN,  December 07, 2009 10:55 AM  Additional Follow-up for Phone Call Additional follow up Details #1::        LMTCB Additional Follow-up by: Lynann Beaver CMA,  December 07, 2009 11:01 AM  New Problems: WEIGHT LOSS (ICD-783.21)   New Problems: WEIGHT LOSS (ICD-783.21)   Pt. notified.

## 2010-07-26 NOTE — Assessment & Plan Note (Signed)
Summary: 3 mo rov/mm   Vital Signs:  Patient profile:   60 year old female Height:      66 inches Weight:      216 pounds BMI:     34.99 Temp:     98.2 degrees F oral Pulse rate:   76 / minute Resp:     14 per minute BP sitting:   126 / 80  (left arm)  Vitals Entered By: Willy Eddy, LPN (March 16, 2009 11:19 AM)  Nutrition Counseling: Patient's BMI is greater than 25 and therefore counseled on weight management options.  CC:  roa.  History of Present Illness: weight loss of nine pounds  Follow-Up Visit      This is a 60 year old woman who presents for Follow-up visit.  The patient denies chest pain, palpitations, dizziness, syncope, low blood sugar symptoms, high blood sugar symptoms, edema, SOB, DOE, PND, and orthopnea.  Since the last visit the patient notes no new problems or concerns.  The patient reports monitoring BP and dietary compliance.  When questioned about possible medication side effects, the patient notes none.    Asthma History    Initial Asthma Severity Rating:    Age range: 12+ years    Symptoms: 0-2 days/week    Nighttime Awakenings: 0-2/month    Interferes w/ normal activity: no limitations    SABA use (not for EIB): 0-2 days/week    Exacerbations requiring oral systemic steroids: 0-1/year    Asthma Severity Assessment: Intermittent   Problems Prior to Update: 1)  Irritable Bowel Syndrome  (ICD-564.1) 2)  Obesity, Morbid  (ICD-278.01) 3)  Back Pain, Lumbar, With Radiculopathy  (ICD-724.4) 4)  Hyperlipidemia  (ICD-272.4) 5)  Hormone Replacement Therapy  (ICD-V07.4) 6)  Physical Examination  (ICD-V70.0) 7)  Asthma  (ICD-493.90) 8)  Allergic Rhinitis  (ICD-477.9) 9)  Family History Diabetes 1st Degree Relative  (ICD-V18.0)  Medications Prior to Update: 1)  Zetia 10 Mg  Tabs (Ezetimibe) .... Once Daily 2)  Claritin-D 24 Hour 10-240 Mg  Tb24 (Loratadine-Pseudoephedrine) .... Once Daily 3)  Astelin 137 Mcg/spray  Soln (Azelastine Hcl) ....  Once Daily 4)  Nasonex 50 Mcg/act  Susp (Mometasone Furoate) .... Once Daily 5)  Nabumetone 750 Mg  Tabs (Nabumetone) .... Once Daily 6)  Cymbalta 60 Mg  Cpep (Duloxetine Hcl) .... 0ne By Mouth Daily 7)  Prilosec 20 Mg  Cpdr (Omeprazole) .... Once Daily 8)  Levsin 0.125 Mg  Tabs (Hyoscyamine Sulfate) .... At Bedtime 9)  Estradiol 1 Mg Tabs (Estradiol) .[name].. 1 Once Daily 10)  Metamucil Smooth Texture 28.3 %  Powd (Psyllium) .... 2 Tbls Q Am 11)  Allergy Injections .... Weekly 12)  Fish Oil Concentrate 1000 Mg  Caps (Omega-3 Fatty Acids) .... Kril Oil  2 By Mouth Two Times A Day 13)  Phentermine Hcl 37.5 Mg Tabs (Phentermine Hcl) .... One By Mouth Daily 14)  Metoclopramide Hcl 10 Mg Tabs (Metoclopramide Hcl) .[name].. 1 At Bedtime  Current Medications (verified): 1)  Zetia 10 Mg  Tabs (Ezetimibe) .... Once Daily 2)  Claritin-D 24 Hour 10-240 Mg  Tb24 (Loratadine-Pseudoephedrine) .... Once Daily 3)  Astelin 137 Mcg/spray  Soln (Azelastine Hcl) .... Two Times A Day 4)  Nasonex 50 Mcg/act  Susp (Mometasone Furoate) .... Once Daily 5)  Nabumetone 750 Mg  Tabs (Nabumetone) .... Once Daily 6)  Cymbalta 60 Mg  Cpep (Duloxetine Hcl) .... 0ne By Mouth Daily 7)  Prilosec 20 Mg  Cpdr (Omeprazole) .... Once Daily  8)  Estradiol 1 Mg Tabs (Estradiol) .[name].. 1 Once Daily 9)  Metamucil Smooth Texture 28.3 %  Powd (Psyllium) .... 2 Tbls Q Am 10)  Allergy Injections .... Weekly 11)  Fish Oil Concentrate 1000 Mg  Caps (Omega-3 Fatty Acids) .... Kril Oil  2 By Mouth Two Times A Day 12)  Phentermine Hcl 37.5 Mg Tabs (Phentermine Hcl) .... One By Mouth Daily 13)  Metoclopramide Hcl 10 Mg Tabs (Metoclopramide Hcl) .[name].. 1 At Bedtime  Allergies (verified): 1)  ! Penicillin  Past History:  Family History: Last updated: 08/06/2007 Family History Diabetes 1st degree relative Family History of Cardiovascular disorder  grand mother  Social History: Last updated: 02/12/2007 Married Never Smoked Alcohol  use-yes  Risk Factors: Smoking Status: never (02/12/2007) Passive Smoke Exposure: no (08/06/2007)  Past medical, surgical, family and social histories (including risk factors) reviewed, and no changes noted (except as noted below).  Past Medical History: Reviewed history from 08/11/2008 and no changes required. Allergic rhinitis Asthma IBS Hyperlipidemia lumbar disc dz  Past Surgical History: Reviewed history from 02/12/2007 and no changes required. Colonoscopy-04/09/2005 EGD-04/09/2005 Carpal tunnel release-bilat Hysterectomy Oophorectomy Rotator cuff repair-left Splenectomy Breast implantation LASIK eye sx  Family History: Reviewed history from 08/06/2007 and no changes required. Family History Diabetes 1st degree relative Family History of Cardiovascular disorder  grand mother  Social History: Reviewed history from 02/12/2007 and no changes required. Married Never Smoked Alcohol use-yes  Review of Systems  The patient denies anorexia, fever, weight loss, weight gain, vision loss, decreased hearing, hoarseness, chest pain, syncope, dyspnea on exertion, peripheral edema, prolonged cough, headaches, hemoptysis, abdominal pain, melena, hematochezia, severe indigestion/heartburn, hematuria, incontinence, genital sores, muscle weakness, suspicious skin lesions, transient blindness, difficulty walking, depression, unusual weight change, abnormal bleeding, enlarged lymph nodes, angioedema, and breast masses.    Physical Exam  General:  overweight-appearing.  alert.   Head:  Normocephalic and atraumatic without obvious abnormalities. No apparent alopecia or balding. Eyes:  pupils equal and pupils round.    allergies Ears:  R ear normal and L ear normal.   Nose:  nasal dischargemucosal pallor.   Mouth:  pharyngeal exudate and posterior lymphoid hypertrophy.   Neck:  No deformities, masses, or tenderness noted. Lungs:  normal respiratory effort, no accessory muscle use,  and no wheezes.   Heart:  normal rate, regular rhythm, and no murmur.   Abdomen:  Bowel sounds positive,abdomen soft and non-tender without masses, organomegaly or hernias noted.   Impression & Recommendations:  Problem # 1:  ASTHMA (ICD-493.90) Assessment Unchanged  Problem # 2:  HYPERLIPIDEMIA (ICD-272.4) Assessment: Unchanged  Her updated medication list for this problem includes:    Zetia 10 Mg Tabs (Ezetimibe) ..... Once daily  Labs Reviewed: SGOT: 20 (08/04/2008)   SGPT: 23 (08/04/2008)  Lipid Goals: Chol Goal: 200 (10/12/2008)   HDL Goal: 40 (10/12/2008)   LDL Goal: 160 (10/12/2008)   TG Goal: 150 (10/12/2008)  Prior 10 Yr Risk Heart Disease: Not enough information (10/12/2008)   HDL:83.7 (08/04/2008), 55.5 (05/03/2008)  LDL:DEL (08/04/2008), 126 (05/03/2008)  Chol:261 (08/04/2008), 200 (05/03/2008)  Trig:97 (08/04/2008), 93 (05/03/2008)  Problem # 3:  OBESITY, MORBID (ICD-278.01) weight loss  Complete Medication List: 1)  Zetia 10 Mg Tabs (Ezetimibe) .... Once daily 2)  Claritin-d 24 Hour 10-240 Mg Tb24 (Loratadine-pseudoephedrine) .... Once daily 3)  Astelin 137 Mcg/spray Soln (Azelastine hcl) .... Two times a day 4)  Nasonex 50 Mcg/act Susp (Mometasone furoate) .... Once daily 5)  Nabumetone 750 Mg Tabs (Nabumetone) .Marland KitchenMarland KitchenMarland Kitchen  Once daily 6)  Cymbalta 60 Mg Cpep (Duloxetine hcl) .... 0ne by mouth daily 7)  Prilosec 20 Mg Cpdr (Omeprazole) .... Once daily 8)  Estradiol 1 Mg Tabs (Estradiol) .[name].. 1 once daily 9)  Metamucil Smooth Texture 28.3 % Powd (Psyllium) .... 2 tbls q am 10)  Allergy Injections  .... Weekly 11)  Fish Oil Concentrate 1000 Mg Caps (Omega-3 fatty acids) .... Kril oil  2 by mouth two times a day 12)  Phentermine Hcl 37.5 Mg Tabs (Phentermine hcl) .... One by mouth daily 13)  Metoclopramide Hcl 10 Mg Tabs (Metoclopramide hcl) .[name].. 1 at bedtime  Other Orders: Flu Vaccine 6yrs + (60109) Admin 1st Vaccine (32355)  Patient Instructions: 1)  Please  schedule a follow-up appointment in 3 months. 2)  BMP prior to visit, ICD-9:401.90 3)  Hepatic Panel prior to visit, ICD-9:995.20 4)  Lipid Panel prior to visit, ICD-9:272.4 5)  HbgA1C prior to visit, ICD-9:272.4   Immunizations Administered:  Influenza Vaccine # 1:    Vaccine Type: Fluvax 3+    Site: left deltoid    Mfr: [employer]    Dose: 0.5 ml    Route: IM    Given by: Willy Eddy, LPN    Exp. Date: 12/21/2009    Lot #: [id number]

## 2010-07-26 NOTE — Progress Notes (Signed)
Summary: cough  Phone Note Call from Patient   Caller: Patient Call For: Stacie Glaze MD Summary of Call: Pt is calling to let Dr. Lovell Sheehan know she is coughing up phlegm (yellow) x 3 days with fatigue.  Asking for advice.  [phone]  No fever or other symptoms.  [facility]) Initial call taken by: Lynann Beaver CMA,  January 04, 2010 10:50 AM  Follow-up for Phone Call        per dr Lovell Sheehan may have z pack Follow-up by: Willy Eddy, LPN,  January 04, 2010 12:11 PM    New/Updated Medications: ZITHROMAX Z-PAK 250 MG TABS (AZITHROMYCIN) asdirected Prescriptions: ZITHROMAX Z-PAK 250 MG TABS (AZITHROMYCIN) asdirected  #6 x 0   Entered by:   Lynann Beaver CMA   Authorized by:   Stacie Glaze MD   Signed by:   Lynann Beaver CMA on 01/04/2010   Method used:   [facility]ronically to        [facility] [phone]* (retail)       [address]       [place], [place]  [address]       Ph: [phone]       Fax: [phone]   RxID:   [id number]  Pt notified.

## 2010-07-26 NOTE — Assessment & Plan Note (Signed)
Summary: 3 mo rov/mm---PT [facility] // RS   Vital Signs:  Patient profile:   60 year old female Height:      66 inches Weight:      212 pounds BMI:     34.34 Temp:     98.2 degrees F oral Pulse rate:   72 / minute Resp:     14 per minute BP sitting:   124 / 76  (left arm)  Vitals Entered By: Willy Eddy, LPN (July 12, 2009 11:30 AM)  Nutrition Counseling: Patient's BMI is greater than 25 and therefore counseled on weight management options. CC: roa labs   CC:  roa labs.  History of Present Illness: follow for weight , asthma, mood and mdications management  Hyperlipidemia Follow-Up      This is a 60 year old woman who presents for Hyperlipidemia follow-up.  The patient denies muscle aches, GI upset, abdominal pain, flushing, itching, constipation, diarrhea, and fatigue.  The patient denies the following symptoms: chest pain/pressure, exercise intolerance, dypsnea, palpitations, syncope, and pedal edema.  Compliance with medications (by patient report) has been near 100%.    Asthma History    Asthma Control Assessment:    Age range: 12+ years    Symptoms: 0-2 days/week    Nighttime Awakenings: 0-2/month    Interferes w/ normal activity: no limitations    Asthma Control Assessment: Well Controlled   Preventive Screening-Counseling & Management  Alcohol-Tobacco     Smoking Status: never     Passive Smoke Exposure: no  Problems Prior to Update: 1)  Acute Ethmoidal Sinusitis  (ICD-461.2) 2)  Irritable Bowel Syndrome  (ICD-564.1) 3)  Obesity, Morbid  (ICD-278.01) 4)  Back Pain, Lumbar, With Radiculopathy  (ICD-724.4) 5)  Hyperlipidemia  (ICD-272.4) 6)  Hormone Replacement Therapy  (ICD-V07.4) 7)  Physical Examination  (ICD-V70.0) 8)  Asthma  (ICD-493.90) 9)  Allergic Rhinitis  (ICD-477.9) 10)  Family History Diabetes 1st Degree Relative  (ICD-V18.0)  Medications Prior to Update: 1)  Zetia 10 Mg  Tabs (Ezetimibe) .... Once Daily 2)  Claritin-D 24 Hour 10-240 Mg   Tb24 (Loratadine-Pseudoephedrine) .... Once Daily 3)  Astelin 137 Mcg/spray  Soln (Azelastine Hcl) .... Two Times A Day 4)  Nasonex 50 Mcg/act  Susp (Mometasone Furoate) .... Once Daily 5)  Nabumetone 750 Mg  Tabs (Nabumetone) .... Once Daily 6)  Cymbalta 60 Mg  Cpep (Duloxetine Hcl) .... 0ne By Mouth Daily 7)  Prilosec 20 Mg  Cpdr (Omeprazole) .... Once Daily 8)  Estradiol 1 Mg Tabs (Estradiol) .[name].. 1 Once Daily 9)  Metamucil Smooth Texture 28.3 %  Powd (Psyllium) .... 2 Tbls Q Am 10)  Allergy Injections .... Weekly 11)  Fish Oil Concentrate 1000 Mg  Caps (Omega-3 Fatty Acids) .... Kril Oil  2 By Mouth Two Times A Day 12)  Phentermine Hcl 37.5 Mg Tabs (Phentermine Hcl) .... One By Mouth Daily 13)  Metoclopramide Hcl 10 Mg Tabs (Metoclopramide Hcl) .[name].. 1 At Bedtime 14)  Allerx Dose Pack  Misc (Pse-Methscop & Cpm-Pe-Methscop) .... Use One By Mouth Two Times A Day As Directed For 10 Days 15)  Clarithromycin 500 Mg Xr24h-Tab (Clarithromycin) .... One By Mouth Two Times A Day  Current Medications (verified): 1)  Zetia 10 Mg  Tabs (Ezetimibe) .... Once Daily 2)  Claritin 10 Mg Tabs (Loratadine) .[name].. 1 Once Daily 3)  Astelin 137 Mcg/spray  Soln (Azelastine Hcl) .... Two Times A Day 4)  Nasonex 50 Mcg/act  Susp (Mometasone Furoate) .... Once Daily  5)  Nabumetone 750 Mg  Tabs (Nabumetone) .... Once Daily 6)  Cymbalta 60 Mg  Cpep (Duloxetine Hcl) .... 0ne By Mouth Daily 7)  Prilosec 20 Mg  Cpdr (Omeprazole) .... Once Daily 8)  Estradiol 1 Mg Tabs (Estradiol) .[name].. 1 Once Daily 9)  Metamucil Smooth Texture 28.3 %  Powd (Psyllium) .... 2 Tbls Q Am 10)  Allergy Injections .... Weekly 11)  Fish Oil Concentrate 1000 Mg  Caps (Omega-3 Fatty Acids) .... Kril Oil  2 By Mouth Two Times A Day 12)  Phentermine Hcl 37.5 Mg Tabs (Phentermine Hcl) .... One By Mouth Daily 13)  Metoclopramide Hcl 10 Mg Tabs (Metoclopramide Hcl) .[name].. 1 At Bedtime 14)  Sudafed 12 Hour 120 Mg Xr12h-Tab (Pseudoephedrine Hcl) .[name].. 1  Once Daily 15)  Moviprep 100 Gm  Solr (Peg-Kcl-Nacl-Nasulf-Na Asc-C) .... As Per Prep Instructions.  Allergies (verified): 1)  ! Penicillin 2)  ! Penicillin  Past History:  Family History: Last updated: 08/06/2007 Family History Diabetes 1st degree relative Family History of Cardiovascular disorder  grand mother  Social History: Last updated: 02/12/2007 Married Never Smoked Alcohol use-yes  Risk Factors: Smoking Status: never (07/12/2009) Passive Smoke Exposure: no (07/12/2009)  Past medical, surgical, family and social histories (including risk factors) reviewed, and no changes noted (except as noted below).  Past Medical History: Reviewed history from 08/11/2008 and no changes required. Allergic rhinitis Asthma IBS Hyperlipidemia lumbar disc dz  Past Surgical History: Reviewed history from 02/12/2007 and no changes required. Colonoscopy-04/09/2005 EGD-04/09/2005 Carpal tunnel release-bilat Hysterectomy Oophorectomy Rotator cuff repair-left Splenectomy Breast implantation LASIK eye sx  Family History: Reviewed history from 08/06/2007 and no changes required. Family History Diabetes 1st degree relative Family History of Cardiovascular disorder  grand mother  Social History: Reviewed history from 02/12/2007 and no changes required. Married Never Smoked Alcohol use-yes  Review of Systems  The patient denies anorexia, fever, weight loss, weight gain, vision loss, decreased hearing, hoarseness, chest pain, syncope, dyspnea on exertion, peripheral edema, prolonged cough, headaches, hemoptysis, abdominal pain, melena, hematochezia, severe indigestion/heartburn, hematuria, incontinence, genital sores, muscle weakness, suspicious skin lesions, transient blindness, difficulty walking, depression, unusual weight change, abnormal bleeding, enlarged lymph nodes, angioedema, and breast masses.    Physical Exam  General:  overweight-appearing.  alert.   Head:   Normocephalic and atraumatic without obvious abnormalities. No apparent alopecia or balding. Eyes:  pupils equal and pupils round.    allergies Nose:  nasal dischargemucosal pallor.   Mouth:  pharyngeal exudate and posterior lymphoid hypertrophy.   Neck:  No deformities, masses, or tenderness noted. Lungs:  normal respiratory effort, no accessory muscle use, and no wheezes.   Heart:  normal rate, regular rhythm, and no murmur.   Abdomen:  Bowel sounds positive,abdomen soft and non-tender without masses, organomegaly or hernias noted. Pulses:  R and L carotid,radial,femoral,dorsalis pedis and posterior tibial pulses are full and equal bilaterally Extremities:  No clubbing, cyanosis, edema, or deformity noted with normal full range of motion of all joints.   Neurologic:  No cranial nerve deficits noted. Station and gait are normal. Plantar reflexes are down-going bilaterally. DTRs are symmetrical throughout. Sensory, motor and coordinative functions appear intact.   Impression & Recommendations:  Problem # 1:  IRRITABLE BOWEL SYNDROME (ICD-564.1) PT IS ON LOW DOSE aspirin AND AT LESS RISK FOR SIDE EFECT IBS has stble pattern not worison for GI bleeding  Problem # 2:  ASTHMA (ICD-493.90) STABLE, compliant with inhailers at goal  Problem # 3:  HYPERLIPIDEMIA (ICD-272.4) Assessment: Unchanged  Her updated medication list for this problem includes:    Zetia 10 Mg Tabs (Ezetimibe) ..... Once daily  Labs Reviewed: SGOT: 22 (07/05/2009)   SGPT: 19 (07/05/2009)  Lipid Goals: Chol Goal: 200 (10/12/2008)   HDL Goal: 40 (10/12/2008)   LDL Goal: 160 (10/12/2008)   TG Goal: 150 (10/12/2008)  Prior 10 Yr Risk Heart Disease: Not enough information (10/12/2008)   HDL:70.40 (07/05/2009), 83.7 (08/04/2008)  LDL:DEL (08/04/2008), 126 (05/03/2008)  Chol:239 (07/05/2009), 261 (08/04/2008)  Trig:150.0 (07/05/2009), 97 (08/04/2008)  Problem # 4:  OBESITY, MORBID (ICD-278.01)  Problem # 5:   HYPERLIPIDEMIA (ICD-272.4)  Her updated medication list for this problem includes:    Zetia 10 Mg Tabs (Ezetimibe) ..... Once daily  Labs Reviewed: SGOT: 22 (07/05/2009)   SGPT: 19 (07/05/2009)  Lipid Goals: Chol Goal: 200 (10/12/2008)   HDL Goal: 40 (10/12/2008)   LDL Goal: 160 (10/12/2008)   TG Goal: 150 (10/12/2008)  Prior 10 Yr Risk Heart Disease: Not enough information (10/12/2008)   HDL:70.40 (07/05/2009), 83.7 (08/04/2008)  LDL:DEL (08/04/2008), 126 (05/03/2008)  Chol:239 (07/05/2009), 261 (08/04/2008)  Trig:150.0 (07/05/2009), 97 (08/04/2008)  Complete Medication List: 1)  Zetia 10 Mg Tabs (Ezetimibe) .... Once daily 2)  Claritin 10 Mg Tabs (Loratadine) .[name].. 1 once daily 3)  Astelin 137 Mcg/spray Soln (Azelastine hcl) .... Two times a day 4)  Nasonex 50 Mcg/act Susp (Mometasone furoate) .... Once daily 5)  Nabumetone 750 Mg Tabs (Nabumetone) .... Once daily 6)  Cymbalta 60 Mg Cpep (Duloxetine hcl) .... 0ne by mouth daily 7)  Prilosec 20 Mg Cpdr (Omeprazole) .... Once daily 8)  Estradiol 1 Mg Tabs (Estradiol) .[name].. 1 once daily 9)  Metamucil Smooth Texture 28.3 % Powd (Psyllium) .... 2 tbls q am 10)  Allergy Injections  .... Weekly 11)  Fish Oil Concentrate 1000 Mg Caps (Omega-3 fatty acids) .... Kril oil  2 by mouth two times a day 12)  Phentermine Hcl 37.5 Mg Tabs (Phentermine hcl) .... One by mouth daily 13)  Metoclopramide Hcl 10 Mg Tabs (Metoclopramide hcl) .[name].. 1 at bedtime 14)  Sudafed 12 Hour 120 Mg Xr12h-tab (Pseudoephedrine hcl) .[name].. 1 once daily 15)  Moviprep 100 Gm Solr (Peg-kcl-nacl-nasulf-na asc-c) .... As per prep instructions.  Patient Instructions: 1)  Please schedule a follow-up appointment in 3 months.

## 2010-07-26 NOTE — Assessment & Plan Note (Signed)
Summary: 2 month rov/njr   Vital Signs:  Patient Profile:   60 Years Old Female Weight:      220 pounds Temp:     98.2 degrees F rectal Pulse rate:   80 / minute Resp:     14 per minute BP sitting:   136 / 82  (left arm)  Vitals Entered By: Willy Eddy, LPN (October 05, 2007 11:50 AM)                 Chief Complaint:  roa after changing vivelle dot to estraiol gell--didnt work so pt went back on vivelle dot and states she will learn to live with hot flashes.    Current Allergies: ! PENICILLIN  Past Medical History:    Reviewed history from 02/12/2007 and no changes required:       Allergic rhinitis       Asthma       IBS  Past Surgical History:    Reviewed history from 02/12/2007 and no changes required:       Colonoscopy-04/09/2005       EGD-04/09/2005       Carpal tunnel release-bilat       Hysterectomy       Oophorectomy       Rotator cuff repair-left       Splenectomy       Breast implantation       LASIK eye sx   Family History:    Reviewed history from 08/06/2007 and no changes required:       Family History Diabetes 1st degree relative       Family History of Cardiovascular disorder  grand mother  Social History:    Reviewed history from 02/12/2007 and no changes required:       Married       Never Smoked       Alcohol use-yes     Physical Exam  General:     overweight-appearing.   Head:     normocephalic and atraumatic.   Eyes:     pupils equal and pupils round.   Ears:     R ear normal and L ear normal.   Nose:     no external deformity.   Mouth:     good dentition and pharynx pink and moist.   Lungs:     normal respiratory effort, no accessory muscle use, and no wheezes.   Heart:     normal rate, regular rhythm, and no murmur.   Abdomen:     Bowel sounds positive,abdomen soft and non-tender without masses, organomegaly or hernias noted.    Impression & Recommendations:  Problem # 1:  ASTHMA (ICD-493.90) asthma  stable  Problem # 2:  ALLERGIC RHINITIS (ICD-477.9)  Her updated medication list for this problem includes:    Astelin 137 Mcg/spray Soln (Azelastine hcl) ..... Once daily    Nasonex 50 Mcg/act Susp (Mometasone furoate) ..... Once daily Discussed use of allergy medications and environmental measures.   Problem # 3:  HORMONE REPLACEMENT THERAPY (ICD-V07.4) viville dot  Complete Medication List: 1)  Zetia 10 Mg Tabs (Ezetimibe) .... Once daily 2)  Claritin-d 24 Hour 10-240 Mg Tb24 (Loratadine-pseudoephedrine) .... Once daily 3)  Astelin 137 Mcg/spray Soln (Azelastine hcl) .... Once daily 4)  Nasonex 50 Mcg/act Susp (Mometasone furoate) .... Once daily 5)  Nabumetone 750 Mg Tabs (Nabumetone) .... Once daily 6)  Cymbalta 30 Mg Cpep (Duloxetine hcl) .... Once daily 7)  Prilosec 20 Mg Cpdr (Omeprazole) .Marland KitchenMarland KitchenMarland Kitchen  Once daily 8)  Levsin 0.125 Mg Tabs (Hyoscyamine sulfate) .... At bedtime 9)  Vivelle-dot 0.025 Mg/24hr Pttw (Estradiol) .... Weekly 10)  Metamucil Smooth Texture 28.3 % Powd (Psyllium) .... 2 tbls q am   Patient Instructions: 1)  Please schedule a follow-up appointment in 4 months. 2)  Hepatic Panel prior to visit, ICD-9:  272.4 3)  Lipid Panel prior to visit, ICD-9:995.20    ]

## 2010-07-26 NOTE — Progress Notes (Signed)
Summary: Has questions about visit  Phone Note Call from Patient   Reason for Call: Acute Illness Summary of Call: Patient wants to know why she didn't get a new script today for weight loss medication?? She can be reached at [phone]. Initial call taken by: Darra Lis RMA,  March 16, 2009 3:17 PM  Follow-up for Phone Call        called pt and will call in script- pt states she doesnt need refill now-- I told her to call pharmacy when refill is needed Follow-up by: Willy Eddy, LPN,  March 17, 2009 8:04 AM

## 2010-07-26 NOTE — Assessment & Plan Note (Signed)
Summary: 3 month rov/njr   Vital Signs:  Patient Profile:   60 Years Old Female Weight:      228 pounds Temp:     98.3 degrees F oral Pulse rate:   76 / minute Resp:     14 per minute BP sitting:   124 / 80  (left arm)  Vitals Entered By: Willy Eddy, LPN (May 10, 2008 9:49 AM)                 Chief Complaint:  roa labs.  History of Present Illness:  Follow-Up Visit      This is a 60 year old woman who presents for Follow-up visit.  The patient denies chest pain, palpitations, dizziness, syncope, low blood sugar symptoms, high blood sugar symptoms, edema, SOB, DOE, PND, and orthopnea.  Since the last visit the patient notes no new problems or concerns.  The patient reports taking meds as prescribed.  When questioned about possible medication side effects, the patient notes none.      Current Allergies: ! PENICILLIN  Past Medical History:    Reviewed history from 02/09/2008 and no changes required:       Allergic rhinitis       Asthma       IBS       Hyperlipidemia  Past Surgical History:    Reviewed history from 02/12/2007 and no changes required:       Colonoscopy-04/09/2005       EGD-04/09/2005       Carpal tunnel release-bilat       Hysterectomy       Oophorectomy       Rotator cuff repair-left       Splenectomy       Breast implantation       LASIK eye sx   Family History:    Reviewed history from 08/06/2007 and no changes required:       Family History Diabetes 1st degree relative       Family History of Cardiovascular disorder  grand mother  Social History:    Reviewed history from 02/12/2007 and no changes required:       Married       Never Smoked       Alcohol use-yes    Review of Systems  The patient denies anorexia, fever, weight loss, weight gain, vision loss, decreased hearing, hoarseness, chest pain, syncope, dyspnea on exertion, peripheral edema, prolonged cough, headaches, hemoptysis, abdominal pain, melena, hematochezia,  severe indigestion/heartburn, hematuria, incontinence, muscle weakness, suspicious skin lesions, transient blindness, difficulty walking, depression, unusual weight change, abnormal bleeding, enlarged lymph nodes, angioedema, and breast masses.     Physical Exam  General:     overweight-appearing.  alert.   Head:     Normocephalic and atraumatic without obvious abnormalities. No apparent alopecia or balding. Eyes:     pupils equal and pupils round.    allergies Ears:     R ear normal and L ear normal.   Nose:     nasal dischargemucosal pallor.   Mouth:     pharyngeal exudate and posterior lymphoid hypertrophy.   Neck:     No deformities, masses, or tenderness noted. Lungs:     normal respiratory effort, no accessory muscle use, and no wheezes.   Heart:     normal rate, regular rhythm, and no murmur.   Abdomen:     Bowel sounds positive,abdomen soft and non-tender without masses, organomegaly or hernias noted. Msk:  No deformity or scoliosis noted of thoracic or lumbar spine.   Extremities:     trace left pedal edema and trace right pedal edema.   Neurologic:     alert & oriented X3, cranial nerves II-XII intact, and gait normal.  alert & oriented X3.      Complete Medication List: 1)  Zetia 10 Mg Tabs (Ezetimibe) .... Once daily 2)  Claritin-d 24 Hour 10-240 Mg Tb24 (Loratadine-pseudoephedrine) .... Once daily 3)  Astelin 137 Mcg/spray Soln (Azelastine hcl) .... Once daily 4)  Nasonex 50 Mcg/act Susp (Mometasone furoate) .... Once daily 5)  Nabumetone 750 Mg Tabs (Nabumetone) .... Once daily 6)  Cymbalta 60 Mg Cpep (Duloxetine hcl) .... 0ne by mouth daily 7)  Prilosec 20 Mg Cpdr (Omeprazole) .... Once daily 8)  Levsin 0.125 Mg Tabs (Hyoscyamine sulfate) .... At bedtime 9)  Estradiol 1 Mg Tabs (Estradiol) .[name].. 1 once daily 10)  Metamucil Smooth Texture 28.3 % Powd (Psyllium) .... 2 tbls q am 11)  Allergy Injections  .... Weekly 12)  Fish Oil Concentrate 1000 Mg Caps  (Omega-3 fatty acids) .... Kril oil  2 by mouth two times a day   Patient Instructions: 1)  FEB CPX   ]   Appended Document: Orders Update     Clinical Lists Changes  Orders: Added new Service order of Est. Patient Level IV (10272) - Signed

## 2010-07-26 NOTE — Progress Notes (Signed)
Summary: Irritable Bowel aggrivated?  Phone Note Call from Patient   Caller: Patient Call For: [name] Summary of Call: Pt called to report she had the "stomach bug" last week and she seemed to get better.  Questioning if she may have aggrivated the IB syndrome because she is continuing with the stomach cramps.  She feels she has eht diarrhea under control, bu the stomach cramps continue to be a problem.  She has been using Immodium and taking her Levsin at HS. Any other advice? [facility] Initial call taken by: Sid Falcon LPN,  April 28, 2007 10:32 AM  Follow-up for Phone Call        Need to give bowell rest for several days with clear liquids... after thant adding activela ( yogurt) then slowly advance diet Follow-up by: Stacie Glaze MD,  April 28, 2007 1:35 PM  Additional Follow-up for Phone Call Additional follow up Details #1::        Pt informed. Additional Follow-up by: Sid Falcon LPN,  April 28, 2007 2:39 PM

## 2010-07-26 NOTE — Progress Notes (Signed)
Summary: sinus xray results  Phone Note Call from Patient Call back at Home Phone [phone] Call back at [phone] c   Reason for Call: Acute Illness, Lab or Test Results Complaint: Headache Summary of Call: Xray Sinuses yesterday pm.  Would love to get rid of this headache. " Head not totally in gear today."  Initial call taken by: Rudy Jew, RN,  August 30, 2009 1:59 PM  Follow-up for Phone Call        she has left ethmoid sinusitis per dr Lovell Sheehan- needs 21 days of levoquin 500 1 once daily per dr Lovell Sheehan  Follow-up by: Willy Eddy, LPN,  August 30, 2009 2:37 PM  Additional Follow-up for Phone Call Additional follow up Details #1::        Phone Call Completed Additional Follow-up by: Rudy Jew, RN,  August 30, 2009 2:46 PM    New/Updated Medications: LEVAQUIN 500 MG TABS (LEVOFLOXACIN) One daily for 21 days Prescriptions: LEVAQUIN 500 MG TABS (LEVOFLOXACIN) One daily for 21 days  #21 x 0   Entered by:   Rudy Jew, RN   Authorized by:   Stacie Glaze MD   Signed by:   Rudy Jew, RN on 08/30/2009   Method used:   [facility]ronically to        [facility] [phone]* (retail)       [address]       [place], [place]  [address]       Ph: [phone]       Fax: [phone]   RxID:   [id number]

## 2010-07-26 NOTE — Letter (Signed)
Summary: Patient Notice- Polyp Results  [facility]  [address] [place], [place] [address]   Phone: [phone]  Fax: [phone]        August 07, 2009 MRN: [id number]    [name] [address] [place], [place]  [address]    Dear Ms. Currie,  I am pleased to inform you that the colon polyp(s) removed during your recent colonoscopy was (were) found to be benign (no cancer detected) upon pathologic examination.  I recommend you have a repeat colonoscopy examination in 5 years to look for recurrent polyps, as having colon polyps increases your risk for having recurrent polyps or even colon cancer in the future.  Should you develop new or worsening symptoms of abdominal pain, bowel habit changes or bleeding from the rectum or bowels, please schedule an evaluation with either your primary care physician or with me.  Additional information/recommendations:  __ No further action with gastroenterology is needed at this time. Please      follow-up with your primary care physician for your other healthcare      needs.   Please call us if you are having persistent problems or have questions about your condition that have not been fully answered at this time.  Sincerely,  Hilarie Fredrickson MD  This letter has been electronically signed by your physician.  Appended Document: Patient Notice- Polyp Results Letter mailed 2.15.11

## 2010-07-26 NOTE — Letter (Signed)
Summary: Colonoscopy Letter  [facility]  [address] [place], [place] [address]   Phone: [phone]  Fax: [phone]      July 05, 2009 MRN: [id number]   [name] [address] [place], [place]  [address]   Dear [name],   According to your medical record, it is time for you to schedule a Colonoscopy. The American Cancer Society recommends this procedure as a method to detect early colon cancer. Patients with a family history of colon cancer, or a personal history of colon polyps or inflammatory bowel disease are at increased risk.  This letter has beeen generated based on the recommendations made at the time of your procedure. If you feel that in your particular situation this may no longer apply, please contact our office.  Please call our office at [phone] to schedule this appointment or to update your records at your earliest convenience.  Thank you for cooperating with [name] to provide you with the very best care possible.   Sincerely,  Wilhemina Bonito. Marina Goodell, M.D.  [facility] [phone]

## 2010-07-26 NOTE — Progress Notes (Signed)
  Phone Note Call from Patient Call back at Home Phone [phone]   Caller: Patient Call For: Stacie Glaze MD Reason for Call: Lab or Test Results, Referral Summary of Call: [facility]) Pt is having leg cramps and thinks it is related to the Crestor.  Initial call taken by: Lynann Beaver CMA AAMA,  May 21, 2010 9:07 AM  Follow-up for Phone Call        was taking crestor 2 times a week and when he went up to 3 a week, gave leg cramps- will go back to 2 times a week and let dr Lovell Sheehan know at [facility] 12-22 how it is doing. Follow-up by: Willy Eddy, LPN,  May 21, 2010 9:20 AM

## 2010-07-26 NOTE — Assessment & Plan Note (Signed)
Summary: CPX/MHF   Vital Signs:  Patient Profile:   60 Years Old Female Weight:      222 pounds Temp:     98.5 degrees F oral Pulse rate:   76 / minute Resp:     14 per minute BP sitting:   130 / 80  (left arm)  Vitals Entered By: Willy Eddy, LPN (August 06, 2007 2:31 PM)                 Chief Complaint:  CPX/DT TODAY/HAS YEARLY PAP AND BREAST EXAM WITH DR ANDERSON/COLON 2006.  History of Present Illness: Current Problems:  PHYSICAL EXAMINATION (ICD-V70.0)  ASTHMA (ICD-493.90) ALLERGIC RHINITIS (ICD-477.9) FAMILY HISTORY DIABETES 1ST DEGREE RELATIVE (ICD-V18.0)      Current Allergies: ! PENICILLIN  Past Medical History:    Reviewed history from 02/12/2007 and no changes required:       Allergic rhinitis       Asthma       IBS   Family History:    Reviewed history from 02/12/2007 and no changes required:       Family History Diabetes 1st degree relative       Family History of Cardiovascular disorder  grand mother  Social History:    Reviewed history from 02/12/2007 and no changes required:       Married       Never Smoked       Alcohol use-yes   Risk Factors:  Passive smoke exposure:  no Drug use:  no HIV high-risk behavior:  no  Family History Risk Factors:    Family History of MI in females < 59 years old:  no    Family History of MI in males < 15 years old:  no  Colonoscopy History:     Date of Last Colonoscopy:  04/09/2005    Results:  repeat in 2016    Review of Systems  The patient denies anorexia, fever, weight loss, vision loss, decreased hearing, hoarseness, chest pain, syncope, dyspnea on exhertion, peripheral edema, prolonged cough, hemoptysis, abdominal pain, melena, hematochezia, severe indigestion/heartburn, hematuria, incontinence, genital sores, muscle weakness, suspicious skin lesions, transient blindness, difficulty walking, depression, unusual weight change, abnormal bleeding, enlarged lymph nodes, angioedema, and  breast masses.         hot flashes   Physical Exam  General:     overweight-appearing.   Head:     normocephalic and atraumatic.   Eyes:     pupils equal and pupils round.   Ears:     R ear normal and L ear normal.   Nose:     no external deformity.   Mouth:     good dentition and pharynx pink and moist.   Neck:     No deformities, masses, or tenderness noted. Lungs:     normal respiratory effort, no accessory muscle use, and no wheezes.   Heart:     normal rate, regular rhythm, and no murmur.   Abdomen:     soft and non-tender.   Genitalia:     normal introitus.   Pulses:     R and L carotid,radial,femoral,dorsalis pedis and posterior tibial pulses are full and equal bilaterally Extremities:     trace left pedal edema and trace right pedal edema.   Neurologic:     alert & oriented X3, cranial nerves II-XII intact, and gait normal.      Complete Medication List: 1)  Zetia 10 Mg Tabs (Ezetimibe) .... Once daily 2)  Claritin-d 24 Hour 10-240 Mg Tb24 (Loratadine-pseudoephedrine) .... Once daily 3)  Astelin 137 Mcg/spray Soln (Azelastine hcl) .... Once daily 4)  Nasonex 50 Mcg/act Susp (Mometasone furoate) .... Once daily 5)  Nabumetone 750 Mg Tabs (Nabumetone) .... Once daily 6)  Cymbalta 60 Mg Cpep (Duloxetine hcl) .... Once daily 7)  Prilosec 20 Mg Cpdr (Omeprazole) .... Once daily 8)  Levsin 0.125 Mg Tabs (Hyoscyamine sulfate) .... At bedtime 9)  Vivelle-dot 0.025 Mg/24hr Pttw (Estradiol) .... Weekly 10)  Metamucil Smooth Texture 28.3 % Powd (Psyllium) .... 2 tbls q am   Patient Instructions: 1)  Please schedule a follow-up appointment in 2 months.    ]  Tetanus/Td Vaccine    Vaccine Type: Tdap    Site: left deltoid    Mfr: Sanofi Pasteur    Dose: 0.5 ml    Route: IM    Given by: Willy Eddy, LPN    Exp. Date: 07/30/2009    Lot #: [id number]   Colonoscopy  Procedure date:  04/09/2005  Findings:      repeat in  2016   Colonoscopy  Procedure date:  04/09/2005  Findings:      repeat in 2016

## 2010-07-26 NOTE — Progress Notes (Signed)
Summary: vivelle strength  Phone Note Call from [name]   Caller: [name] Call For: Dr. Lovell Sheehan Summary of Call: Pt. walked in with 0.0375 mg. and was under the impresssion she was going up in the hormone levels.  Per. Dr. Cato Mulligan, we gave pt .1 mg. samples until we can ask Dr. Lovell Sheehan on Monday.  Chart states 0.025 mg. Initial call taken by: Lynann Beaver CMA,  August 07, 2007 3:47 PM  Follow-up for Phone Call        her chart was .025 and the .0375 WAS AN INCREASE!!!!!!!!!!!! Follow-up by: Stacie Glaze MD,  August 10, 2007 9:59 AM  Additional Follow-up for Phone Call Additional follow up Details #1::        Called pt, gave her Dr Lovell Sheehan response.  She reported their has been a miss communication.  Pt was increased to 0.1 mg Vivelle Patches about 1 year ago by Dr Dareen Piano, pt was never on .025, that is/was a mistake. Additional Follow-up by: Sid Falcon LPN,  August 10, 2007 10:36 AM    Additional Follow-up for Phone Call Additional follow up Details #2::    then she is on te max dose of the patch and may need a change in delivery rought to controll  her symptoms trial of estrogen lotion daily   will write RX and send in if she wants Follow-up by: Stacie Glaze MD,  August 10, 2007 11:03 AM  Additional Follow-up for Phone Call Additional follow up Details #3:: Details for Additional Follow-up Action Taken: Pt informed of RX.  Pharmacy had already called her and they will have to order the Estrogen Lotion as they do not have it in stock. Additional Follow-up by: Sid Falcon LPN,  August 10, 2007 5:31 PM  New/Updated Medications: DIVIGEL 1 MG/GM  GEL (ESTRADIOL) apply one pk to skin daily   Prescriptions: DIVIGEL 1 MG/GM  GEL (ESTRADIOL) apply one pk to skin daily  #30 x 3   Entered and Authorized by:   Stacie Glaze MD   Signed by:   Stacie Glaze MD on 08/10/2007   Method used:   Electronically sent to ...       [facility] [phone]*       [address]       [place], [place]  [address]       Ph: [phone]       Fax: [phone]   RxID:   [id number]

## 2010-07-26 NOTE — Letter (Signed)
Summary: [facility]  [facility]   Imported By: Maryln Gottron 09/19/2008 14:54:18  _____________________________________________________________________  External Attachment:    Type:   Image     Comment:   External Document

## 2010-07-26 NOTE — Letter (Signed)
Summary: Moviprep Instructions  [facility]  [address]   [place], [place] [address]   Phone: [phone]  Fax: [phone]       [name]    May 17, 1951    MRN: [id number]        Procedure Day /Date: Friday 08-04-09     Arrival Time: 9:30 a.m.      Procedure Time: 10:30 a.m.     Location of Procedure:                    _x _  [facility] (4th Floor)                        PREPARATION FOR COLONOSCOPY WITH MOVIPREP   Starting 5 days prior to your procedure  07-30-09 do not eat nuts, seeds, popcorn, corn, beans, peas,  salads, or any raw vegetables.  Do not take any fiber supplements (e.g. Metamucil, Citrucel, and Benefiber).  THE DAY BEFORE YOUR PROCEDURE         DATE:  08-03-09   DAY: Thursday  1.  Drink clear liquids the entire day-NO SOLID FOOD  2.  Do not drink anything colored red or purple.  Avoid juices with pulp.  No orange juice.  3.  Drink at least 64 oz. (8 glasses) of fluid/clear liquids during the day to prevent dehydration and help the prep work efficiently.  CLEAR LIQUIDS INCLUDE: Water Jello Ice Popsicles Tea (sugar ok, no milk/cream) Powdered fruit flavored drinks Coffee (sugar ok, no milk/cream) Gatorade Juice: apple, white grape, white cranberry  Lemonade Clear bullion, consomm, broth Carbonated beverages (any kind) Strained chicken noodle soup Hard Candy                             4.  In the morning, mix first dose of MoviPrep solution:    Empty 1 Pouch A and 1 Pouch B into the disposable container    Add lukewarm drinking water to the top line of the container. Mix to dissolve    Refrigerate (mixed solution should be used within 24 hrs)  5.  Begin drinking the prep at 5:00 p.m. The MoviPrep container is divided by 4 marks.   Every 15 minutes drink the solution down to the next mark (approximately 8 oz) until the full liter is complete.   6.  Follow completed prep with 16 oz of clear liquid of your  choice (Nothing red or purple).  Continue to drink clear liquids until bedtime.  7.  Before going to bed, mix second dose of MoviPrep solution:    Empty 1 Pouch A and 1 Pouch B into the disposable container    Add lukewarm drinking water to the top line of the container. Mix to dissolve    Refrigerate  THE DAY OF YOUR PROCEDURE      DATE: 08-04-09   DAY: Friday  Beginning at 5:30 a.m. (5 hours before procedure):         1. Every 15 minutes, drink the solution down to the next mark (approx 8 oz) until the full liter is complete.  2. Follow completed prep with 16 oz. of clear liquid of your choice.    3. You may drink clear liquids until 8:30 a.m.   (2 HOURS BEFORE PROCEDURE).   MEDICATION INSTRUCTIONS  Unless otherwise instructed, you should take regular prescription medications with a small sip of water  as early as possible the morning of your procedure.         OTHER INSTRUCTIONS  You will need a responsible adult at least 60 years of age to accompany you and drive you home.   This person must remain in the waiting room during your procedure.  Wear loose fitting clothing that is easily removed.  Leave jewelry and other valuables at home.  However, you may wish to bring a book to read or  an iPod/MP3 player to listen to music as you wait for your procedure to start.  Remove all body piercing jewelry and leave at home.  Total time from sign-in until discharge is approximately 2-3 hours.  You should go home directly after your procedure and rest.  You can resume normal activities the  day after your procedure.  The day of your procedure you should not:   Drive   Make legal decisions   Operate machinery   Drink alcohol   Return to work  You will receive specific instructions about eating, activities and medications before you leave.    The above instructions have been reviewed and explained to me by   Ulis Rias RN  July 21, 2009 10:46 AM     I  fully understand and can verbalize these instructions _____________________________ Date _________

## 2010-07-26 NOTE — Progress Notes (Signed)
Summary: fall broken nose & hand xray  Phone Note Call from Patient Call back at Home Phone [phone]   Summary of Call: Larey Seat yesterday am & think I have broken nose and possibly done something to left hand painful side of hand & last two fingers.  Nose & eyes look awful.   Know you don't xray there.  Can you send me there?  Initial call taken by: Rudy Jew, RN,  September 19, 2009 12:21 PM  Follow-up for Phone Call        Phone Call Completed Follow-up by: Rudy Jew, RN,  September 19, 2009 12:31 PM  New Problems: ACCIDENTAL FALL FROM BED (ICD-E884.4)   New Problems: ACCIDENTAL FALL FROM BED (ICD-E884.4)

## 2010-07-26 NOTE — Assessment & Plan Note (Signed)
Summary: 4 month rov/njr   Vital Signs:  Patient Profile:   60 Years Old Female Weight:      225 pounds Temp:     98.5 degrees F oral Pulse rate:   76 / minute Resp:     14 per minute BP sitting:   136 / 78  (left arm)  Vitals Entered By: Willy Eddy, LPN (February 09, 2008 11:47 AM)                 Chief Complaint:  roa labs.  History of Present Illness:  Hyperlipidemia Follow-Up      This is a right-handed patient who presents with Hyperlipidemia follow-up.  The patient denies muscle aches, GI upset, abdominal pain, flushing, itching, constipation, diarrhea, and fatigue.  The patient denies the following symptoms: chest pain/pressure, exercise intolerance, dypsnea, palpitations, syncope, and pedal edema.  Compliance with medications (by patient report) has been near 100%.  Dietary compliance has been fair.  The patient reports exercising 3-4X per week.  Adjunctive measures currently used by the patient include fish oil supplements.      Current Allergies: ! PENICILLIN  Past Medical History:    Reviewed history from 02/12/2007 and no changes required:       Allergic rhinitis       Asthma       IBS       Hyperlipidemia  Past Surgical History:    Reviewed history from 02/12/2007 and no changes required:       Colonoscopy-04/09/2005       EGD-04/09/2005       Carpal tunnel release-bilat       Hysterectomy       Oophorectomy       Rotator cuff repair-left       Splenectomy       Breast implantation       LASIK eye sx   Family History:    Reviewed history from 08/06/2007 and no changes required:       Family History Diabetes 1st degree relative       Family History of Cardiovascular disorder  grand mother  Social History:    Reviewed history from 02/12/2007 and no changes required:       Married       Never Smoked       Alcohol use-yes    Review of Systems  The patient denies anorexia, fever, weight loss, weight gain, vision loss, decreased hearing,  hoarseness, chest pain, syncope, dyspnea on exertion, peripheral edema, prolonged cough, headaches, hemoptysis, abdominal pain, melena, hematochezia, severe indigestion/heartburn, hematuria, incontinence, genital sores, muscle weakness, suspicious skin lesions, transient blindness, difficulty walking, depression, unusual weight change, abnormal bleeding, enlarged lymph nodes, angioedema, and breast masses.     Physical Exam  General:     overweight-appearing.   Head:     normocephalic and atraumatic.   Eyes:     pupils equal and pupils round.    allergies Ears:     R ear normal and L ear normal.   Nose:     nasal dischargemucosal pallor.   Mouth:     pharyngeal exudate and posterior lymphoid hypertrophy.   Neck:     No deformities, masses, or tenderness noted. Lungs:     normal respiratory effort, no accessory muscle use, and no wheezes.   Heart:     normal rate, regular rhythm, and no murmur.      Impression & Recommendations:  Problem # 1:  ASTHMA (ICD-493.90) stable  off the inhailer  Problem # 2:  HYPERLIPIDEMIA (ICD-272.4)  Her updated medication list for this problem includes:    Zetia 10 Mg Tabs (Ezetimibe) ..... Once daily  Labs Reviewed: Chol: 226 (02/02/2008)   HDL: 46.2 (02/02/2008)   LDL: DEL (02/02/2008)   TG: 169 (02/02/2008) SGOT: 21 (02/02/2008)   SGPT: 17 (02/02/2008)   Problem # 3:  ALLERGIC RHINITIS (ICD-477.9) very puffy eyes, using the opiticon without sucess Her updated medication list for this problem includes:    Astelin 137 Mcg/spray Soln (Azelastine hcl) ..... Once daily    Nasonex 50 Mcg/act Susp (Mometasone furoate) ..... Once daily Discussed use of allergy medications and environmental measures.   Problem # 4:  HORMONE REPLACEMENT THERAPY (ICD-V07.4)  Complete Medication List: 1)  Zetia 10 Mg Tabs (Ezetimibe) .... Once daily 2)  Claritin-d 24 Hour 10-240 Mg Tb24 (Loratadine-pseudoephedrine) .... Once daily 3)  Astelin 137 Mcg/spray Soln  (Azelastine hcl) .... Once daily 4)  Nasonex 50 Mcg/act Susp (Mometasone furoate) .... Once daily 5)  Nabumetone 750 Mg Tabs (Nabumetone) .... Once daily 6)  Cymbalta 60 Mg Cpep (Duloxetine hcl) .... 0ne by mouth daily 7)  Prilosec 20 Mg Cpdr (Omeprazole) .... Once daily 8)  Levsin 0.125 Mg Tabs (Hyoscyamine sulfate) .... At bedtime 9)  Vivelle-dot 0.1 Mg/24hr Pttw (Estradiol) .... Weekly 10)  Metamucil Smooth Texture 28.3 % Powd (Psyllium) .... 2 tbls q am 11)  Metoclopramide Hcl 10 Mg Tabs (Metoclopramide hcl) .... At bedtime 12)  Allergy Injections  .... Weekly 13)  Fish Oil Concentrate 1000 Mg Caps (Omega-3 fatty acids) .... Kril oil  2 by mouth two times a day   Patient Instructions: 1)  Please schedule a follow-up appointment in 3 months. 2)  Hepatic Panel prior to visit, ICD-9: 3)  Lipid Panel prior to visit, ICD-9:   Prescriptions: CYMBALTA 60 MG  CPEP (DULOXETINE HCL) 0ne by mouth daily  #30 x 11   Entered and Authorized by:   Stacie Glaze MD   Signed by:   Stacie Glaze MD on 02/09/2008   Method used:   Electronically sent to ...       [facility] [phone]*       [address]       [place], [place]  [address]       Ph: [phone]       Fax: [phone]   RxID:   [id number]  ]

## 2010-07-26 NOTE — Letter (Signed)
Summary: Previsit letter  [facility]  [address] [place], [place] [address]   Phone: [phone]  Fax: [phone]       07/06/2009 MRN: [id number]  [name] [address] [place], [place]  [address]  Dear [name],  Welcome to the Gastroenterology Division at [facility].    You are scheduled to see a nurse for your pre-procedure visit on 07-21-09 at 11:00a.m. on the 3rd floor at [facility], [address]  We ask that you try to arrive at our office 15 minutes prior to your appointment time to allow for check-in.  Your nurse visit will consist of discussing your medical and surgical history, your immediate family medical history, and your medications.    Please bring a complete list of all your medications or, if you prefer, bring the medication bottles and we will list them.  We will need to be aware of both prescribed and over the counter drugs.  We will need to know exact dosage information as well.  If you are on blood thinners (Coumadin, Plavix, Aggrenox, Ticlid, etc.) please call our office today/prior to your appointment, as we need to consult with your physician about holding your medication.   Please be prepared to read and sign documents such as consent forms, a financial agreement, and acknowledgement forms.  If necessary, and with your consent, a friend or relative is welcome to sit-in on the nurse visit with you.  Please bring your insurance card so that we may make a copy of it.  If your insurance requires a referral to see a specialist, please bring your referral form from your primary care physician.  No co-pay is required for this nurse visit.     If you cannot keep your appointment, please call [phone] to cancel or reschedule prior to your appointment date.  This allows [name] the opportunity to schedule an appointment for another patient in need of care.    Thank you for choosing [facility] for your medical  needs.  We appreciate the opportunity to care for you.  Please visit [name] at our website  to learn more about our practice.                     Sincerely.                                                                                                                   The Gastroenterology Division

## 2010-07-26 NOTE — Assessment & Plan Note (Signed)
Summary: 3 MONTH F/U//ALP   Vital Signs:  Patient profile:   60 year old female Height:      66 inches Weight:      212 pounds BMI:     34.34 Temp:     98.2 degrees F oral Pulse rate:   72 / minute Resp:     14 per minute BP sitting:   132 / 80  (left arm)  Vitals Entered By: Willy Eddy, LPN (June 14, 2010 9:21 AM) CC: roa labs-request ear check---requesting lower dose of phentermine, Lipid Management Is Patient Diabetic? No   Primary Care Provider:  Stacie Glaze MD  CC:  roa labs-request ear check---requesting lower dose of phentermine and Lipid Management.  History of Present Illness: follow up lipids  Hyperlipidemia Follow-Up      This is a 60 year old woman who presents for Hyperlipidemia follow-up.  The patient denies muscle aches, GI upset, abdominal pain, flushing, itching, constipation, diarrhea, and fatigue.  The patient denies the following symptoms: chest pain/pressure, exercise intolerance, dypsnea, palpitations, syncope, and pedal edema.  Compliance with medications (by patient report) has been near 100%.  Dietary compliance has been excellent.  The patient reports exercising occasionally.    Lipid Management History:      Positive NCEP/ATP III risk factors include female age 60 years old or older.  Negative NCEP/ATP III risk factors include no family history for ischemic heart disease, non-tobacco-user status, non-hypertensive, no ASHD (atherosclerotic heart disease), no prior stroke/TIA, no peripheral vascular disease, and no history of aortic aneurysm.    Preventive Screening-Counseling & Management  Alcohol-Tobacco     Smoking Status: never     Passive Smoke Exposure: no     Tobacco Counseling: not indicated; no tobacco use  Problems Prior to Update: 1)  Weight Loss  (ICD-783.21) 2)  Accidental Fall From Bed  (ICD-E884.4) 3)  Acute Ethmoidal Sinusitis  (ICD-461.2) 4)  Irritable Bowel Syndrome  (ICD-564.1) 5)  Obesity, Morbid  (ICD-278.01) 6)   Back Pain, Lumbar, With Radiculopathy  (ICD-724.4) 7)  Hyperlipidemia  (ICD-272.4) 8)  Hormone Replacement Therapy  (ICD-V07.4) 9)  Physical Examination  (ICD-V70.0) 10)  Asthma  (ICD-493.90) 11)  Allergic Rhinitis  (ICD-477.9) 12)  Family History Diabetes 1st Degree Relative  (ICD-V18.0)  Current Problems (verified): 1)  Weight Loss  (ICD-783.21) 2)  Accidental Fall From Bed  (ICD-E884.4) 3)  Acute Ethmoidal Sinusitis  (ICD-461.2) 4)  Irritable Bowel Syndrome  (ICD-564.1) 5)  Obesity, Morbid  (ICD-278.01) 6)  Back Pain, Lumbar, With Radiculopathy  (ICD-724.4) 7)  Hyperlipidemia  (ICD-272.4) 8)  Hormone Replacement Therapy  (ICD-V07.4) 9)  Physical Examination  (ICD-V70.0) 10)  Asthma  (ICD-493.90) 11)  Allergic Rhinitis  (ICD-477.9) 12)  Family History Diabetes 1st Degree Relative  (ICD-V18.0)  Medications Prior to Update: 1)  Crestor 20 Mg Tabs (Rosuvastatin Calcium) .... One By Mouth Weekly On Tuesday Nights 2)  Claritin 10 Mg Tabs (Loratadine) .[name].. 1 Once Daily 3)  Astelin 137 Mcg/spray  Soln (Azelastine Hcl) .... Two Times A Day 4)  Nasonex 50 Mcg/act  Susp (Mometasone Furoate) .... Once Daily 5)  Nabumetone 750 Mg  Tabs (Nabumetone) .... Once Daily 6)  Cymbalta 60 Mg  Cpep (Duloxetine Hcl) .... 0ne By Mouth Daily 7)  Prilosec 20 Mg  Cpdr (Omeprazole) .... Once Daily 8)  Estradiol 1 Mg Tabs (Estradiol) .[name].. 1 Once Daily 9)  Metamucil Smooth Texture 28.3 %  Powd (Psyllium) .... 2 Tbls Q Am 10)  Allergy  Injections .... Weekly 11)  Fish Oil Concentrate 1000 Mg  Caps (Omega-3 Fatty Acids) .... Kril Oil  2 By Mouth Two Times A Day 12)  Metoclopramide Hcl 10 Mg Tabs (Metoclopramide Hcl) .[name].. 1 At Bedtime  Current Medications (verified): 1)  Crestor 20 Mg Tabs (Rosuvastatin Calcium) .... Take 1 Tab 2 Times A Week On Tuay and Friday 2)  Claritin 10 Mg Tabs (Loratadine) .[name].. 1 Once Daily 3)  Astelin 137 Mcg/spray  Soln (Azelastine Hcl) .... Two Times A Day 4)  Nasonex 50 Mcg/act   Susp (Mometasone Furoate) .... Once Daily 5)  Nabumetone 750 Mg  Tabs (Nabumetone) .... Once Daily 6)  Cymbalta 60 Mg  Cpep (Duloxetine Hcl) .... 0ne By Mouth Daily 7)  Prilosec 20 Mg  Cpdr (Omeprazole) .... Once Daily 8)  Estradiol 1 Mg Tabs (Estradiol) .[name].. 1 Once Daily 9)  Metamucil Smooth Texture 28.3 %  Powd (Psyllium) .... 2 Tbls Q Am 10)  Allergy Injections .... Weekly 11)  Fish Oil Concentrate 1000 Mg  Caps (Omega-3 Fatty Acids) .... Kril Oil  2 By Mouth Two Times A Day 12)  Metoclopramide Hcl 10 Mg Tabs (Metoclopramide Hcl) .[name].. 1 At Bedtime 13)  Phentermine Hcl 37.5 Mg Tabs (Phentermine Hcl) .[name].. 1 Once Daily  Allergies (verified): 1)  ! Penicillin 2)  ! Penicillin  Past History:  Family History: Last updated: 08/06/2007 Family History Diabetes 1st degree relative Family History of Cardiovascular disorder  grand mother  Social History: Last updated: 02/12/2007 Married Never Smoked Alcohol use-yes  Risk Factors: Smoking Status: never (06/14/2010) Passive Smoke Exposure: no (06/14/2010)  Past medical, surgical, family and social histories (including risk factors) reviewed, and no changes noted (except as noted below).  Past Medical History: Reviewed history from 08/11/2008 and no changes required. Allergic rhinitis Asthma IBS Hyperlipidemia lumbar disc dz  Past Surgical History: Reviewed history from 02/12/2007 and no changes required. Colonoscopy-04/09/2005 EGD-04/09/2005 Carpal tunnel release-bilat Hysterectomy Oophorectomy Rotator cuff repair-left Splenectomy Breast implantation LASIK eye sx  Family History: Reviewed history from 08/06/2007 and no changes required. Family History Diabetes 1st degree relative Family History of Cardiovascular disorder  grand mother  Social History: Reviewed history from 02/12/2007 and no changes required. Married Never Smoked Alcohol use-yes  Review of Systems       The patient complains of weight gain.  The  patient denies anorexia, fever, weight loss, vision loss, decreased hearing, hoarseness, chest pain, syncope, dyspnea on exertion, peripheral edema, prolonged cough, headaches, hemoptysis, abdominal pain, melena, hematochezia, severe indigestion/heartburn, hematuria, incontinence, genital sores, muscle weakness, suspicious skin lesions, transient blindness, difficulty walking, depression, unusual weight change, abnormal bleeding, enlarged lymph nodes, angioedema, and breast masses.    Physical Exam  General:  overweight-appearing.  alert.   Head:  Normocephalic and atraumatic without obvious abnormalities. No apparent alopecia or balding. Eyes:  pupils equal and pupils round.    allergies Ears:  R ear normal and L ear normal.   Nose:  nasal dischargemucosal pallor.   Mouth:  good dentition and pharynx pink and moist.   Neck:  No deformities, masses, or tenderness noted. Lungs:  normal respiratory effort and no wheezes.   Heart:  normal rate and regular rhythm.   Abdomen:  soft, non-tender, and distended.   Msk:  normal ROM and no joint tenderness.   Pulses:  R and L carotid,radial,femoral,dorsalis pedis and posterior tibial pulses are full and equal bilaterally Extremities:  trace left pedal edema and trace right pedal edema.   Neurologic:  alert & oriented X3 and DTRs symmetrical and normal.     Impression & Recommendations:  Problem # 1:  IRRITABLE BOWEL SYNDROME (ICD-564.1) Assessment Unchanged stable  Problem # 2:  OBESITY, MORBID (ICD-278.01) Assessment: Unchanged  exercize needs allow with diet  Ht: 66 (06/14/2010)   Wt: 212 (06/14/2010)   BMI: 34.34 (06/14/2010)  Problem # 3:  HYPERLIPIDEMIA (ICD-272.4) Assessment: Improved good responce form the pulse therapy Her updated medication list for this problem includes:    Crestor 20 Mg Tabs (Rosuvastatin calcium) .[name]... Take 1 tab 2 times a week on tuay and friday  Labs Reviewed: SGOT: 22 (06/05/2010)   SGPT: 16  (06/05/2010)  Lipid Goals: Chol Goal: 200 (10/12/2008)   HDL Goal: 40 (10/12/2008)   LDL Goal: 160 (10/12/2008)   TG Goal: 150 (10/12/2008)  Prior 10 Yr Risk Heart Disease: Not enough information (10/12/2008)   HDL:55.80 (06/05/2010), 68.20 (03/06/2010)  LDL:87 (06/05/2010), DEL (84/69/6295)  Chol:154 (06/05/2010), 250 (03/06/2010)  Trig:54.0 (06/05/2010), 97.0 (03/06/2010)  Problem # 4:  EUSTACHIAN TUBE DYSFUNCTION, CHRONIC (ICD-381.81)     otc sudafed  Complete Medication List: 1)  Crestor 20 Mg Tabs (Rosuvastatin calcium) .... Take 1 tab 2 times a week on tuay and friday 2)  Claritin 10 Mg Tabs (Loratadine) .[name].. 1 once daily 3)  Astelin 137 Mcg/spray Soln (Azelastine hcl) .... Two times a day 4)  Nasonex 50 Mcg/act Susp (Mometasone furoate) .... Once daily 5)  Nabumetone 750 Mg Tabs (Nabumetone) .... Once daily 6)  Cymbalta 60 Mg Cpep (Duloxetine hcl) .... 0ne by mouth daily 7)  Prilosec 20 Mg Cpdr (Omeprazole) .... Once daily 8)  Estradiol 1 Mg Tabs (Estradiol) .[name].. 1 once daily 9)  Metamucil Smooth Texture 28.3 % Powd (Psyllium) .... 2 tbls q am 10)  Allergy Injections  .... Weekly 11)  Fish Oil Concentrate 1000 Mg Caps (Omega-3 fatty acids) .... Kril oil  2 by mouth two times a day 12)  Metoclopramide Hcl 10 Mg Tabs (Metoclopramide hcl) .[name].. 1 at bedtime 13)  Phentermine Hcl 37.5 Mg Tabs (Phentermine hcl) .[name].. 1 once daily  Lipid Assessment/Plan:      Based on NCEP/ATP III, the patient's risk factor category is "0-1 risk factors".  The patient's lipid goals are as follows: Total cholesterol goal is 200; LDL cholesterol goal is 160; HDL cholesterol goal is 40; Triglyceride goal is 150.     Patient Instructions: 1)  June CPX   Orders Added: 1)  Est. Patient Level IV [28413]

## 2010-07-26 NOTE — Progress Notes (Signed)
Summary: no better  Phone Note Call from Patient Call back at Home Phone [phone]   Caller: Patient Call For: Maclaine Ahola Summary of Call: Finished Zpack, takes saline nasal spray, and has been using an OTC decogestant and she is not any better pt wants to know what to do next cause she is still having sinus h/a's.  [facility] [address] Initial call taken by: Alfred Levins, CMA,  August 28, 2009 11:55 AM  Follow-up for Phone Call        per d rjenkins- set up for sinus films please Follow-up by: Willy Eddy, LPN,  August 28, 2009 3:01 PM  Additional Follow-up for Phone Call Additional follow up Details #1::        Pt. notified. Additional Follow-up by: Lynann Beaver CMA,  August 28, 2009 3:22 PM

## 2010-07-31 ENCOUNTER — Other Ambulatory Visit: Payer: Self-pay | Admitting: Dermatology

## 2010-08-20 ENCOUNTER — Other Ambulatory Visit: Payer: Self-pay | Admitting: Dermatology

## 2010-09-03 ENCOUNTER — Other Ambulatory Visit: Payer: Self-pay | Admitting: Internal Medicine

## 2010-09-03 LAB — COMPREHENSIVE METABOLIC PANEL
ALT: 47 U/L — ABNORMAL HIGH (ref 0–35)
AST: 96 U/L — ABNORMAL HIGH (ref 0–37)
Albumin: 4.4 g/dL (ref 3.5–5.2)
Alkaline Phosphatase: 134 U/L — ABNORMAL HIGH (ref 39–117)
BUN: 12 mg/dL (ref 6–23)
CO2: 26 mEq/L (ref 19–32)
Calcium: 8.9 mg/dL (ref 8.4–10.5)
Chloride: 100 mEq/L (ref 96–112)
Creatinine, Ser: 0.6 mg/dL (ref 0.4–1.2)
GFR calc Af Amer: >60 mL/min (ref >60)
GFR calc non Af Amer: >60 mL/min (ref >60)
Glucose, Bld: 143 mg/dL — ABNORMAL HIGH (ref 70–99)
Potassium: 3.8 mEq/L (ref 3.5–5.1)
Sodium: 139 mEq/L (ref 135–145)
Total Bilirubin: 1 mg/dL (ref 0.3–1.2)
Total Protein: 7.2 g/dL (ref 6.0–8.3)

## 2010-09-03 LAB — DIFFERENTIAL
Basophils Absolute: 0 10*3/uL (ref 0.0–0.1)
Basophils Relative: 0 % (ref 0–1)
Eosinophils Absolute: 0.1 10*3/uL (ref 0.0–0.7)
Eosinophils Relative: 1 % (ref 0–5)
Lymphocytes Relative: 13 % (ref 12–46)
Lymphs Abs: 2.2 10*3/uL (ref 0.7–4.0)
Monocytes Absolute: 1.2 10*3/uL — ABNORMAL HIGH (ref 0.1–1.0)
Monocytes Relative: 7 % (ref 3–12)
Neutro Abs: 13.4 10*3/uL — ABNORMAL HIGH (ref 1.7–7.7)
Neutrophils Relative %: 79 % — ABNORMAL HIGH (ref 43–77)

## 2010-09-03 LAB — CBC
HCT: 40.9 % (ref 36.0–46.0)
Hemoglobin: 14.1 g/dL (ref 12.0–15.0)
MCH: 30.6 pg (ref 26.0–34.0)
MCHC: 34.5 g/dL (ref 30.0–36.0)
MCV: 88.7 fL (ref 78.0–100.0)
Platelets: 285 10*3/uL (ref 150–400)
RBC: 4.61 MIL/uL (ref 3.87–5.11)
RDW: 11.9 % (ref 11.5–15.5)
WBC: 16.9 10*3/uL — ABNORMAL HIGH (ref 4.0–10.5)

## 2010-09-03 LAB — LIPASE, BLOOD: Lipase: 57 U/L (ref 23–300)

## 2010-10-03 ENCOUNTER — Ambulatory Visit (INDEPENDENT_AMBULATORY_CARE_PROVIDER_SITE_OTHER): Payer: BC Managed Care – PPO | Admitting: Internal Medicine

## 2010-10-03 ENCOUNTER — Encounter: Payer: Self-pay | Admitting: Internal Medicine

## 2010-10-03 DIAGNOSIS — J069 Acute upper respiratory infection, unspecified: Secondary | ICD-10-CM

## 2010-10-03 MED ORDER — DOXYCYCLINE HYCLATE 100 MG PO TABS: 100.0000 mg | ORAL_TABLET | Freq: Two times a day (BID) | ORAL | Status: AC

## 2010-10-03 MED ORDER — HYDROCOD POLST-CHLORPHEN POLST 10-8 MG/5ML PO LQCR: 5.0000 mL | Freq: Two times a day (BID) | ORAL | Status: DC | PRN

## 2010-10-07 ENCOUNTER — Encounter: Payer: Self-pay | Admitting: Internal Medicine

## 2010-10-07 NOTE — Assessment & Plan Note (Signed)
Given abx to hold. Begin if sx do not improve after total of 8-10 days from onset. Tussionex prn cough. Cautioned regarding possible sedating effect.  Followup if no improvement or worsening.

## 2010-10-07 NOTE — Progress Notes (Signed)
  Subjective:    Patient ID: [name], female    DOB: 1951-05-21, 60 y.o.   MRN: [id number]  HPI  Pt presents to clinic for evaluation of cough. Notes 4d h/o cough productive for yellow sputum. Denies f/c, wheezing, or dysnpea. +sick exposure. No alleviating or exacerbating factors. No other complaints.  Reviewed pmh, medications and allergies.    Review of Systems  Constitutional: Negative for fever and chills.  HENT: Negative for congestion and rhinorrhea.   Eyes: Negative for discharge and redness.  Respiratory: Positive for cough. Negative for shortness of breath and wheezing.   Skin: Negative for color change and pallor.       Objective:   Physical Exam  Nursing note and vitals reviewed. Constitutional: She appears well-developed and well-nourished. No distress.  HENT:  Head: Normocephalic and atraumatic.  Right Ear: External ear normal.  Left Ear: External ear normal.  Nose: Nose normal.  Mouth/Throat: Oropharynx is clear and moist. No oropharyngeal exudate.  Eyes: Conjunctivae are normal. Right eye exhibits no discharge. Left eye exhibits no discharge. No scleral icterus.  Neck: Neck supple.  Cardiovascular: Normal rate, regular rhythm and normal heart sounds.  Exam reveals no gallop and no friction rub.   No murmur heard. Pulmonary/Chest: Effort normal and breath sounds normal. No respiratory distress. She has no wheezes. She has no rales.  Lymphadenopathy:    She has no cervical adenopathy.  Skin: Skin is warm and dry. She is not diaphoretic.          Assessment & Plan:

## 2010-11-04 ENCOUNTER — Other Ambulatory Visit: Payer: Self-pay | Admitting: Internal Medicine

## 2010-11-09 NOTE — Op Note (Signed)
[name], [name]           ACCOUNT NO.:  [id number]   MEDICAL RECORD NO.:  [id number]          PATIENT TYPE:  AMB   LOCATION:  SDC                           FACILITY:  [facility]   PHYSICIAN:  Randye Lobo, M.D.   DATE OF BIRTH:  August 19, 1950   DATE OF PROCEDURE:  04/15/2006  DATE OF DISCHARGE:                                 OPERATIVE REPORT   PREOPERATIVE DIAGNOSES:  1. Genuine stress incontinence.  2. Incomplete vaginal prolapse.   POSTOPERATIVE DIAGNOSES:  1. Genuine stress incontinence.  2. Incomplete vaginal prolapse.   PROCEDURE:  [facility] transobturator sling, cystoscopy, anterior colporrhaphy.   SURGEON:  Conley Simmonds, MD   ASSISTANT:  Malva Limes, MD   ANESTHESIA:  General endotracheal, local with 0.5% lidocaine with 1:200,000  of epinephrine.   IV FLUIDS:  1500 mL Ringer's lactate.   ESTIMATED BLOOD LOSS:  Minimal.   URINE OUTPUT:  400 mL.   COMPLICATIONS:  None.   INDICATIONS FOR PROCEDURE:  The patient is a 60 year old gravida 67 Caucasian  female status post total abdominal hysterectomy with left salpingo-  oophorectomy and partial right salpingo-oophorectomy and status post  excision of an ovarian remnant who was referred by her primary gynecologist,  Dr. Malva Limes for evaluation and treatment of urinary incontinence.  The  patient reports urinary leakage with coughing and sneezing.  The office  cystometrics documented both genuine stress incontinence and detrusor  instability.  The patient desires surgical treatment for her stress  incontinence and a plan is made to proceed with the [facility] transobturator  sling along with a cystoscopy and possible anterior colporrhaphy after  risks, benefits, and alternatives are reviewed.   FINDINGS:  Exam under anesthesia revealed a first degree cystocele.  Cystoscopy demonstrated the bladder to be intact throughout 360 degrees  including the bladder dome and trigone.  There was no evidence of the  foreign  body in either the bladder or the urethra.  The ureters were noted  to be patent bilaterally after the injection of indigo carmine dye IV.   SPECIMENS:  None.   PROCEDURE:  The patient was reidentified in the preoperative hold area.  She  received Cipro 400 mg IV for antibiotic prophylaxis.  The patient received  both TED hose and PAS stockings for DVT prophylaxis.   In the operating room, general endotracheal anesthesia was induced and the  patient was then placed in the dorsal lithotomy position.  The lower  abdomen, vagina and perineum were then sterilely prepped and draped and a  Foley catheter was placed inside the bladder.   An exam under anesthesia was performed.   A short weighted speculum was placed inside the vagina and Allis clamps were  used to mark the anterior vaginal wall from 1 cm below the urethral meatus  to the base of the cystocele.  The vaginal mucosa was injected with half  percent lidocaine with 1:200,000 of epinephrine.  The vaginal mucosa was  then incised in the midline with a scalpel.  Sharp and blunt dissection were  used to dissect the bladder off of the vaginal mucosa bilaterally.  The  dissection was carried back to the pubic rami bilaterally.   The crural fold incisions were then marked with a marking pen after  identifying the adductor longus tendons of the thighs and coming down to the  level of the clitoris and making the incision at the level of the lateral  border of each of the pubic rami.  The incisions were created sharply with a  scalpel.  With the Foley catheter left to gravity drainage, the sling was  then placed.  The sling passer was placed first on the patient's left-hand  side through the thigh incision, piercing through the obturator membrane,  and coming out behind the pubic ramus and into the vaginal incision on the  ipsilateral side.  The same procedure that was performed on the patient's  left-hand side was then repeated on the  right-hand side again, first going  through the skin incision and then coming out through the vagina.  The sling  was then attached and the needles were drawn back out through the thigh  incisions.  The Foley catheter was removed and cystoscopy was performed and  the findings are as noted above.  Final tension on the sling was adjusted  and the plastic sheaths were removed while a Kelly clamp was placed between  the sling and the urethral meatus.  The sling was noted to be in good  position.  The excess sling material was then trimmed.  A single  colporrhaphy suture of 2-0 Vicryl was placed for reduction of the small  cystocele.  Excess vaginal mucosa was trimmed and the anterior vaginal wall  was then closed with a running locked suture of 2-0 Vicryl.   The patient's Foley catheter was left to gravity drainage during placement  of the sling and will be left to gravity drainage at termination of the  procedure.   There were no complications to the procedure.  All needle, instrument,  sponge counts were correct.  The patient is escorted to the recovery room in  stable and awake condition.      Randye Lobo, M.D.  Electronically Signed     BES/MEDQ  D:  04/15/2006  T:  04/16/2006  Job:  [id number]

## 2010-11-26 ENCOUNTER — Other Ambulatory Visit (INDEPENDENT_AMBULATORY_CARE_PROVIDER_SITE_OTHER): Payer: BC Managed Care – PPO

## 2010-11-26 DIAGNOSIS — Z0000 Encounter for general adult medical examination without abnormal findings: Secondary | ICD-10-CM

## 2010-11-26 LAB — CBC WITH DIFFERENTIAL/PLATELET
Basophils Absolute: 0 10*3/uL (ref 0.0–0.1)
Basophils Relative: 0.5 % (ref 0.0–3.0)
Eosinophils Absolute: 0.2 10*3/uL (ref 0.0–0.7)
Eosinophils Relative: 2 % (ref 0.0–5.0)
HCT: 42.2 % (ref 36.0–46.0)
Hemoglobin: 14.4 g/dL (ref 12.0–15.0)
Lymphocytes Relative: 40.5 % (ref 12.0–46.0)
Lymphs Abs: 3.2 10*3/uL (ref 0.7–4.0)
MCHC: 34.2 g/dL (ref 30.0–36.0)
MCV: 94.7 fl (ref 78.0–100.0)
Monocytes Absolute: 0.6 10*3/uL (ref 0.1–1.0)
Monocytes Relative: 7.1 % (ref 3.0–12.0)
Neutro Abs: 3.9 10*3/uL (ref 1.4–7.7)
Neutrophils Relative %: 49.9 % (ref 43.0–77.0)
Platelets: 285 10*3/uL (ref 150.0–400.0)
RBC: 4.46 Mil/uL (ref 3.87–5.11)
RDW: 12.4 % (ref 11.5–14.6)
WBC: 7.9 10*3/uL (ref 4.5–10.5)

## 2010-11-26 LAB — BASIC METABOLIC PANEL
BUN: 16 mg/dL (ref 6–23)
CO2: 28 mEq/L (ref 19–32)
Calcium: 8.9 mg/dL (ref 8.4–10.5)
Chloride: 100 mEq/L (ref 96–112)
Creatinine, Ser: 0.6 mg/dL (ref 0.4–1.2)
GFR: 114.99 mL/min (ref >60.00)
Glucose, Bld: 125 mg/dL — ABNORMAL HIGH (ref 70–99)
Potassium: 4.9 mEq/L (ref 3.5–5.1)
Sodium: 137 mEq/L (ref 135–145)

## 2010-11-26 LAB — POCT URINALYSIS DIPSTICK
Bilirubin, UA: NEGATIVE
Blood, UA: NEGATIVE
Glucose, UA: NEGATIVE
Ketones, UA: NEGATIVE
Leukocytes, UA: NEGATIVE
Nitrite, UA: NEGATIVE
Protein, UA: NEGATIVE
Spec Grav, UA: 1.02
Urobilinogen, UA: 0.2
pH, UA: 5.5

## 2010-11-26 LAB — LIPID PANEL
Cholesterol: 158 mg/dL (ref 0–200)
HDL: 72 mg/dL (ref >39.00)
LDL Cholesterol: 63 mg/dL (ref 0–99)
Total CHOL/HDL Ratio: 2
Triglycerides: 115 mg/dL (ref 0.0–149.0)
VLDL: 23 mg/dL (ref 0.0–40.0)

## 2010-11-26 LAB — HEPATIC FUNCTION PANEL
ALT: 16 U/L (ref 0–35)
AST: 18 U/L (ref 0–37)
Albumin: 4 g/dL (ref 3.5–5.2)
Alkaline Phosphatase: 110 U/L (ref 39–117)
Bilirubin, Direct: 0.1 mg/dL (ref 0.0–0.3)
Total Bilirubin: 0.4 mg/dL (ref 0.3–1.2)
Total Protein: 6.4 g/dL (ref 6.0–8.3)

## 2010-11-26 LAB — TSH: TSH: 3.02 u[IU]/mL (ref 0.35–5.50)

## 2010-12-03 ENCOUNTER — Encounter: Payer: Self-pay | Admitting: Internal Medicine

## 2010-12-03 ENCOUNTER — Ambulatory Visit (INDEPENDENT_AMBULATORY_CARE_PROVIDER_SITE_OTHER): Payer: BC Managed Care – PPO | Admitting: Internal Medicine

## 2010-12-03 DIAGNOSIS — Z0000 Encounter for general adult medical examination without abnormal findings: Secondary | ICD-10-CM

## 2010-12-03 DIAGNOSIS — K589 Irritable bowel syndrome without diarrhea: Secondary | ICD-10-CM

## 2010-12-03 DIAGNOSIS — J45909 Unspecified asthma, uncomplicated: Secondary | ICD-10-CM

## 2010-12-03 DIAGNOSIS — E785 Hyperlipidemia, unspecified: Secondary | ICD-10-CM

## 2010-12-03 NOTE — Progress Notes (Signed)
Subjective:    Patient ID: [name], female    DOB: 05-22-1951, 60 y.o.   MRN: [id number]  HPI The patient has persistent loose stools following gallbladder surgery she is also on Keflex for a skin breakout(acne) is not on probiotic Patient has not lost weight. Patient's blood glucose is better but still elevated this the function of her weight. Her liver functions have markedly resolved her cholesterol is at goal total cholesterol 158 HDL 72 LDL 63 She has persistent acid reflux.  Some postprandial cough but asthma has been stable the pulmonology and salt and took her off of her rescue inhaler.     Review of Systems  Constitutional: Negative for activity change, appetite change and fatigue.  HENT: Negative for ear pain, congestion, neck pain, postnasal drip and sinus pressure.   Eyes: Negative for redness and visual disturbance.  Respiratory: Negative for cough, shortness of breath and wheezing.   Gastrointestinal: Negative for abdominal pain and abdominal distention.  Genitourinary: Negative for dysuria, frequency and menstrual problem.  Musculoskeletal: Negative for myalgias, joint swelling and arthralgias.  Skin: Negative for rash and wound.  Neurological: Negative for dizziness, weakness and headaches.  Hematological: Negative for adenopathy. Does not bruise/bleed easily.  Psychiatric/Behavioral: Negative for sleep disturbance and decreased concentration.       Past Medical History  Diagnosis Date  . Allergy   . Asthma   . Hyperlipidemia   . IBS (irritable bowel syndrome)   . Lumbar disc disease    Past Surgical History  Procedure Date  . Abdominal hysterectomy   . Oophorectomy   . Carpal tunnel release   . Esophagogastroduodenoscopy   . Rotator cuff repair   . Splenectomy   . Lasik   . Breast surgery     augmetation    reports that she has never smoked. She does not have any smokeless tobacco history on file. She reports that she does not drink  alcohol or use illicit drugs. family history includes Diabetes in her mother and Heart disease in her father. Allergies  Allergen Reactions  . Penicillins     REACTION: rash    Objective:   Physical Exam  Constitutional: She is oriented to person, place, and time. She appears well-developed and well-nourished. No distress.  HENT:  Head: Normocephalic and atraumatic.  Right Ear: External ear normal.  Left Ear: External ear normal.  Nose: Nose normal.  Mouth/Throat: Oropharynx is clear and moist.  Eyes: Conjunctivae and EOM are normal. Pupils are equal, round, and reactive to light.  Neck: Normal range of motion. Neck supple. No JVD present. No tracheal deviation present. No thyromegaly present.  Cardiovascular: Normal rate, regular rhythm, normal heart sounds and intact distal pulses.   No murmur heard. Pulmonary/Chest: Effort normal and breath sounds normal. She has no wheezes. She exhibits no tenderness.  Abdominal: Soft. Bowel sounds are normal.  Musculoskeletal: Normal range of motion. She exhibits no edema and no tenderness.  Lymphadenopathy:    She has no cervical adenopathy.  Neurological: She is alert and oriented to person, place, and time. She has normal reflexes. No cranial nerve deficit.  Skin: Skin is warm and dry. She is not diaphoretic.  Psychiatric: She has a normal mood and affect. Her behavior is normal.          Assessment & Plan:  Because of her syndrome posterior gallbladder surgery have recommended a higher fiber diet less fat in her recommended that she take a probiotic.  We'll give her samples  of align Her cholesterol goal for blood pressures are cold her weight loss program has not been successful.  We recommended increased exercise and lobe and lower portion size  The patient's blood sugar is better but still in the borderline diabetic range we discussed prediabetic and weight loss as treatment plan For her allergies we will give her a prescription  of Allegra-D so she can utilize over-the-counter medications for 30 days  . This is a routine physical examination for this healthy  Female. Reviewed all health maintenance protocols including mammography colonoscopy bone density and reviewed appropriate screening labs. Her immunization history was reviewed as well as her current medications and allergies refills of her chronic medications were given and the plan for yearly health maintenance was discussed all orders and referrals were made as appropriate.

## 2010-12-22 ENCOUNTER — Other Ambulatory Visit: Payer: Self-pay | Admitting: Internal Medicine

## 2011-01-15 ENCOUNTER — Other Ambulatory Visit: Payer: Self-pay | Admitting: Internal Medicine

## 2011-01-28 ENCOUNTER — Telehealth: Payer: Self-pay | Admitting: Internal Medicine

## 2011-01-28 MED ORDER — AZITHROMYCIN 250 MG PO TABS: ORAL_TABLET | ORAL | Status: AC

## 2011-01-28 MED ORDER — METHYLPREDNISOLONE 4 MG PO KIT: PACK | ORAL | Status: AC

## 2011-01-28 NOTE — Telephone Encounter (Signed)
Pt has had sinus headaches and nausea periodically for 3 wks. [facility].

## 2011-01-28 NOTE — Telephone Encounter (Signed)
zpack and medrol dose pack per Dr Lovell Sheehan

## 2011-04-04 ENCOUNTER — Ambulatory Visit (INDEPENDENT_AMBULATORY_CARE_PROVIDER_SITE_OTHER): Payer: BC Managed Care – PPO | Admitting: Internal Medicine

## 2011-04-04 ENCOUNTER — Ambulatory Visit: Payer: BC Managed Care – PPO | Admitting: Internal Medicine

## 2011-04-04 ENCOUNTER — Encounter: Payer: Self-pay | Admitting: Internal Medicine

## 2011-04-04 VITALS — BP 135/88 | HR 76 | Temp 98.2°F | Resp 16 | Ht 66.5 in | Wt 214.0 lb

## 2011-04-04 DIAGNOSIS — E785 Hyperlipidemia, unspecified: Secondary | ICD-10-CM

## 2011-04-04 DIAGNOSIS — L03119 Cellulitis of unspecified part of limb: Secondary | ICD-10-CM

## 2011-04-04 DIAGNOSIS — J069 Acute upper respiratory infection, unspecified: Secondary | ICD-10-CM

## 2011-04-04 DIAGNOSIS — Z23 Encounter for immunization: Secondary | ICD-10-CM

## 2011-04-04 DIAGNOSIS — M722 Plantar fascial fibromatosis: Secondary | ICD-10-CM

## 2011-04-04 DIAGNOSIS — J309 Allergic rhinitis, unspecified: Secondary | ICD-10-CM

## 2011-04-04 NOTE — Progress Notes (Signed)
  Subjective:    Patient ID: [name], female    DOB: 1950/07/03, 60 y.o.   MRN: [id number]  HPI Asthma stable Blood pressure stable URI  Monitoring of lipids to  Review of Systems  HENT: Positive for sneezing, postnasal drip and sinus pressure.   Respiratory: Positive for cough.   Cardiovascular: Negative.   Gastrointestinal: Negative.   Genitourinary: Negative.        Objective:   Physical Exam  Nursing note and vitals reviewed. Constitutional: She appears well-developed and well-nourished.  HENT:  Head: Normocephalic and atraumatic.       Swollen turbinates  Cardiovascular: Normal rate and regular rhythm.   Pulmonary/Chest: Effort normal and breath sounds normal. She has no wheezes.  Abdominal: Soft. Bowel sounds are normal.          Assessment & Plan:  Monitoring of chronic medications and chronic problems.  Her asthma has been stable her blood pressure has been stable she has an acute upper respiratory tract infection for which we'll give her doxycycline 100 mg by mouth twice a day she requires chronic monitoring of her lipids and we will order a lipid and liver

## 2011-04-17 ENCOUNTER — Telehealth: Payer: Self-pay | Admitting: *Deleted

## 2011-04-17 NOTE — Telephone Encounter (Signed)
Pt c/o fungal toenail and has used topical med under nail without relief. Requesting po med---pt aware it will be Thursday before message is answered when dr Lovell Sheehan returns

## 2011-04-18 ENCOUNTER — Other Ambulatory Visit: Payer: Self-pay | Admitting: *Deleted

## 2011-04-18 ENCOUNTER — Telehealth: Payer: Self-pay | Admitting: *Deleted

## 2011-04-18 MED ORDER — TERBINAFINE HCL 250 MG PO TABS: 250.0000 mg | ORAL_TABLET | Freq: Every day | ORAL | Status: DC

## 2011-04-18 NOTE — Telephone Encounter (Signed)
Done

## 2011-04-18 NOTE — Telephone Encounter (Signed)
Per dr Lovell Sheehan- may have lamisil 250 qd for 3 months- pt informed and med sent to [facility] in [place]

## 2011-05-08 ENCOUNTER — Other Ambulatory Visit: Payer: Self-pay | Admitting: Internal Medicine

## 2011-05-13 ENCOUNTER — Telehealth: Payer: Self-pay | Admitting: *Deleted

## 2011-05-13 MED ORDER — AZITHROMYCIN 250 MG PO TABS: ORAL_TABLET | ORAL | Status: AC

## 2011-05-13 NOTE — Telephone Encounter (Signed)
Notified pt. 

## 2011-05-13 NOTE — Telephone Encounter (Signed)
Pt is complaining of a sinus infection, and really needs an antibiotic and RX for the headache.

## 2011-05-13 NOTE — Telephone Encounter (Signed)
Per dr jenkins- may have z pack and mucinex fast max- take as directed 

## 2011-05-24 ENCOUNTER — Telehealth: Payer: Self-pay | Admitting: *Deleted

## 2011-05-24 NOTE — Telephone Encounter (Signed)
mucinex fast max cold and sinus on cap full every 6 hours echinasea cough drops ( [place])

## 2011-05-24 NOTE — Telephone Encounter (Signed)
Patient is finished her z-pak and still taking her cough syrup.  Her headaches are much better, but she still has a Sore throat, cough, and no taste.  Any suggestions?

## 2011-05-24 NOTE — Telephone Encounter (Signed)
Left detailed message on home answering machine.  

## 2011-05-24 NOTE — Telephone Encounter (Signed)
Please advise 

## 2011-05-31 ENCOUNTER — Telehealth: Payer: Self-pay | Admitting: *Deleted

## 2011-05-31 NOTE — Telephone Encounter (Signed)
Patient is calling because she is not better.  She has taken the ATB and her cough has improved.  However she still has a headache and nausea.  She tried Advil with no relief.  Any suggestions?

## 2011-05-31 NOTE — Telephone Encounter (Signed)
Per dr Lovell Sheehan- try theraflu- also suggested warm moist heat on face

## 2011-06-03 ENCOUNTER — Telehealth: Payer: Self-pay

## 2011-06-03 DIAGNOSIS — J3489 Other specified disorders of nose and nasal sinuses: Secondary | ICD-10-CM

## 2011-06-03 DIAGNOSIS — J329 Chronic sinusitis, unspecified: Secondary | ICD-10-CM

## 2011-06-03 NOTE — Telephone Encounter (Signed)
Update:  Pt states her headache got better with antibiotic but it came back along with pressure.   Pt states no medication helps her headache.  The only relief she gets is when she takes relafen at night.  Pt states this happened before and she had to go for an x-ray of her sinuses and she still had a sinus infection. Pt would like to know what she needs to do. Pls advise.

## 2011-06-03 NOTE — Telephone Encounter (Signed)
Per dr Lovell Sheehan- may have sinus films

## 2011-06-03 NOTE — Telephone Encounter (Signed)
Pt is aware.  Orders entered.

## 2011-06-04 ENCOUNTER — Ambulatory Visit (INDEPENDENT_AMBULATORY_CARE_PROVIDER_SITE_OTHER)
Admission: RE | Admit: 2011-06-04 | Discharge: 2011-06-04 | Disposition: A | Discharge status: Home / Self Care | Payer: BC Managed Care – PPO | Source: Ambulatory Visit | Attending: Internal Medicine | Admitting: Internal Medicine

## 2011-06-04 DIAGNOSIS — J329 Chronic sinusitis, unspecified: Secondary | ICD-10-CM

## 2011-06-05 ENCOUNTER — Other Ambulatory Visit: Payer: Self-pay | Admitting: *Deleted

## 2011-06-05 MED ORDER — METHYLPREDNISOLONE (PAK) 4 MG PO TABS: ORAL_TABLET | ORAL | Status: AC

## 2011-06-08 ENCOUNTER — Other Ambulatory Visit: Payer: Self-pay | Admitting: Internal Medicine

## 2011-07-15 ENCOUNTER — Telehealth: Payer: Self-pay | Admitting: *Deleted

## 2011-07-15 NOTE — Telephone Encounter (Signed)
Pt fell while on a cruise, and has sutures in the back of her head, and needs to have them out Thursday, and is asking if she can come in to see Dr. Lovell Sheehan or Rushie Goltz.

## 2011-07-15 NOTE — Telephone Encounter (Signed)
Will come at 11:45

## 2011-07-18 ENCOUNTER — Encounter: Payer: Self-pay | Admitting: Internal Medicine

## 2011-07-18 ENCOUNTER — Ambulatory Visit (INDEPENDENT_AMBULATORY_CARE_PROVIDER_SITE_OTHER): Payer: BC Managed Care – PPO | Admitting: Internal Medicine

## 2011-07-18 DIAGNOSIS — S0100XA Unspecified open wound of scalp, initial encounter: Secondary | ICD-10-CM

## 2011-07-18 DIAGNOSIS — S0101XA Laceration without foreign body of scalp, initial encounter: Secondary | ICD-10-CM

## 2011-07-18 NOTE — Progress Notes (Signed)
  Subjective:    Patient ID: [name], female    DOB: [birth year], 61 y.o.   MRN: [id number]  HPI  Allergies sural is a pleasant 61 year old white female who had a fall on a cruise ship and sustained a laceration to her occiput.  She was evaluated by the cruise doctor at sutures were placed she presents today for suture removal and wound check  Review of Systems  Constitutional: Negative for activity change, appetite change and fatigue.  HENT: Negative for ear pain, congestion, neck pain, postnasal drip and sinus pressure.   Eyes: Negative for redness and visual disturbance.  Respiratory: Negative for cough, shortness of breath and wheezing.   Gastrointestinal: Negative for abdominal pain and abdominal distention.  Genitourinary: Negative for dysuria, frequency and menstrual problem.  Musculoskeletal: Negative for myalgias, joint swelling and arthralgias.  Skin: Negative for rash and wound.  Neurological: Negative for dizziness, weakness and headaches.  Hematological: Negative for adenopathy. Does not bruise/bleed easily.  Psychiatric/Behavioral: Negative for sleep disturbance and decreased concentration.       Objective:   Physical Exam  Nursing note and vitals reviewed. Constitutional: She is oriented to person, place, and time. She appears well-developed and well-nourished. No distress.  HENT:  Head: Normocephalic and atraumatic.  Right Ear: External ear normal.  Left Ear: External ear normal.  Nose: Nose normal.  Mouth/Throat: Oropharynx is clear and moist.  Eyes: Conjunctivae and EOM are normal. Pupils are equal, round, and reactive to light.  Neck: Normal range of motion. Neck supple. No JVD present. No tracheal deviation present. No thyromegaly present.  Cardiovascular: Normal rate, regular rhythm, normal heart sounds and intact distal pulses.   No murmur heard. Pulmonary/Chest: Effort normal and breath sounds normal. She has no wheezes. She exhibits no  tenderness.  Abdominal: Soft. Bowel sounds are normal.  Musculoskeletal: Normal range of motion. She exhibits no edema and no tenderness.  Lymphadenopathy:    She has no cervical adenopathy.  Neurological: She is alert and oriented to person, place, and time. She has normal reflexes. No cranial nerve deficit.  Skin: Skin is warm and dry. She is not diaphoretic.  Psychiatric: She has a normal mood and affect. Her behavior is normal.          Assessment & Plan:  The wound is well approximated there is minimal induration at the site she has excellent healing.  She has no evidence of post concussive headaches nor did she develop any nausea or symptomatology consistent with intracranial hemorrhage.  She is stable neurologically and the sutures were removed today without complication post suture removal wound care was discussed with the patient

## 2011-07-18 NOTE — Patient Instructions (Signed)
Wound Check Your wound appears healthy today. Your wound will heal gradually over time. Eventually a scar will form that will fade with time. FACTORS THAT AFFECT SCAR FORMATION:  People differ in the severity in which they scar.   Scar severity varies according to location, size, and the traits you inherited from your parents (genetic predisposition).   Irritation to the wound from infection, rubbing, or chemical exposure will increase the amount of scar formation.  HOME CARE INSTRUCTIONS   If you were given a dressing, you should change it at least once a day or as instructed by your caregiver. If the bandage sticks, soak it off with a solution of hydrogen peroxide.   If the bandage becomes wet, dirty, or develops a bad smell, change it as soon as possible.   Look for signs of infection.   Only take over-the-counter or prescription medicines for pain, discomfort, or fever as directed by your caregiver.  SEEK IMMEDIATE MEDICAL CARE IF:   You have redness, swelling, or increasing pain in the wound.   You notice pus coming from the wound.   You have a fever.   You notice a bad smell coming from the wound or dressing.  Document Released: 03/16/2004 Document Revised: 02/20/2011 Document Reviewed: 06/10/2005 [facility] Patient Information 2012 [place], [place].

## 2011-08-05 ENCOUNTER — Other Ambulatory Visit: Payer: BC Managed Care – PPO

## 2011-08-06 ENCOUNTER — Other Ambulatory Visit (INDEPENDENT_AMBULATORY_CARE_PROVIDER_SITE_OTHER): Payer: BC Managed Care – PPO

## 2011-08-06 DIAGNOSIS — E785 Hyperlipidemia, unspecified: Secondary | ICD-10-CM

## 2011-08-06 LAB — HEPATIC FUNCTION PANEL
ALT: 15 U/L (ref 0–35)
AST: 19 U/L (ref 0–37)
Albumin: 4.1 g/dL (ref 3.5–5.2)
Alkaline Phosphatase: 119 U/L — ABNORMAL HIGH (ref 39–117)
Bilirubin, Direct: 0 mg/dL (ref 0.0–0.3)
Total Bilirubin: 0.7 mg/dL (ref 0.3–1.2)
Total Protein: 6.9 g/dL (ref 6.0–8.3)

## 2011-08-06 LAB — LIPID PANEL
Cholesterol: 166 mg/dL (ref 0–200)
HDL: 72.1 mg/dL (ref >39.00)
LDL Cholesterol: 77 mg/dL (ref 0–99)
Total CHOL/HDL Ratio: 2
Triglycerides: 84 mg/dL (ref 0.0–149.0)
VLDL: 16.8 mg/dL (ref 0.0–40.0)

## 2011-08-13 ENCOUNTER — Ambulatory Visit (INDEPENDENT_AMBULATORY_CARE_PROVIDER_SITE_OTHER): Payer: BC Managed Care – PPO | Admitting: Internal Medicine

## 2011-08-13 ENCOUNTER — Encounter: Payer: Self-pay | Admitting: Internal Medicine

## 2011-08-13 VITALS — BP 124/80 | HR 76 | Temp 98.2°F | Resp 16 | Ht 66.5 in | Wt 222.0 lb

## 2011-08-13 DIAGNOSIS — F419 Anxiety disorder, unspecified: Secondary | ICD-10-CM

## 2011-08-13 DIAGNOSIS — R141 Gas pain: Secondary | ICD-10-CM

## 2011-08-13 DIAGNOSIS — K589 Irritable bowel syndrome without diarrhea: Secondary | ICD-10-CM

## 2011-08-13 DIAGNOSIS — F32A Depression, unspecified: Secondary | ICD-10-CM

## 2011-08-13 DIAGNOSIS — R143 Flatulence: Secondary | ICD-10-CM

## 2011-08-13 DIAGNOSIS — K3184 Gastroparesis: Secondary | ICD-10-CM

## 2011-08-13 DIAGNOSIS — F341 Dysthymic disorder: Secondary | ICD-10-CM

## 2011-08-13 MED ORDER — DULOXETINE HCL 30 MG PO CPEP: 90.0000 mg | ORAL_CAPSULE | Freq: Every day | ORAL | Status: DC

## 2011-08-13 MED ORDER — ESOMEPRAZOLE MAGNESIUM 40 MG PO CPDR: 40.0000 mg | DELAYED_RELEASE_CAPSULE | Freq: Every day | ORAL | Status: DC

## 2011-08-13 MED ORDER — METOCLOPRAMIDE HCL 10 MG PO TABS: 5.0000 mg | ORAL_TABLET | Freq: Four times a day (QID) | ORAL | Status: DC

## 2011-08-13 NOTE — Progress Notes (Signed)
Subjective:    Patient ID: [name], female    DOB: 29-Aug-1950, 61 y.o.   MRN: [id number]  HPI Patient is a 61 year old female who is followed for asthma and hyperlipidemia who is developed increasing abdominal pain bloating gas and foul-smelling flatus. He has experienced increased food intolerance she has not noticed any diarrhea or change in the shape or size of her stool she has not detected any difference in the color of her stool but she has not observed that.  She has a history of irritable bowel syndrome She takes Metamucil and Reglan She has been on fish oil  Capsules 2000 mg twice a day  The last antibiotics was on azithromycin at the beginning of December 2012   Review of Systems  Constitutional: Negative for activity change, appetite change and fatigue.  HENT: Negative for ear pain, congestion, neck pain, postnasal drip and sinus pressure.   Eyes: Negative for redness and visual disturbance.  Respiratory: Negative for cough, shortness of breath and wheezing.   Gastrointestinal: Negative for abdominal pain and abdominal distention.  Genitourinary: Negative for dysuria, frequency and menstrual problem.  Musculoskeletal: Negative for myalgias, joint swelling and arthralgias.  Skin: Negative for rash and wound.  Neurological: Negative for dizziness, weakness and headaches.  Hematological: Negative for adenopathy. Does not bruise/bleed easily.  Psychiatric/Behavioral: Negative for sleep disturbance and decreased concentration.   Past Medical History  Diagnosis Date  . Allergy   . Asthma   . Hyperlipidemia   . IBS (irritable bowel syndrome)   . Lumbar disc disease     History   Social History  . Marital Status: Married    Spouse Name: N/A    Number of Children: N/A  . Years of Education: N/A   Occupational History  . Not on file.   Social History Main Topics  . Smoking status: Never Smoker   . Smokeless tobacco: Not on file  . Alcohol Use: No  . Drug  Use: No  . Sexually Active: Yes   Other Topics Concern  . Not on file   Social History Narrative  . No narrative on file    Past Surgical History  Procedure Date  . Abdominal hysterectomy   . Oophorectomy   . Carpal tunnel release   . Esophagogastroduodenoscopy   . Rotator cuff repair   . Splenectomy   . Lasik   . Breast surgery     augmetation    Family History  Problem Relation Age of Onset  . Diabetes Mother   . Heart disease Father     Allergies  Allergen Reactions  . Penicillins     REACTION: rash    Current Outpatient Prescriptions on File Prior to Visit  Medication Sig Dispense Refill  . azelastine (ASTELIN) 137 MCG/SPRAY nasal spray 1 spray by Nasal route 2 (two) times daily. Use in each nostril as directed       . CYMBALTA 60 MG capsule TAKE 1 CAPSULE BY MOUTH EVERY DAY  30 capsule  8  . estradiol (VIVELLE-DOT) 0.025 MG/24HR Place 1 patch onto the skin 2 (two) times a week.        . fish oil-omega-3 fatty acids 1000 MG capsule Take 2 g by mouth 2 (two) times daily.        [name] loratadine-pseudoephedrine (CLARITIN-D 24-HOUR) 10-240 MG per 24 hr tablet Take 1 tablet by mouth daily.        . metoCLOPramide (REGLAN) 10 MG tablet TAKE 1 TABLET AT BEDTIME  30 tablet  11  . nabumetone (RELAFEN) 750 MG tablet TAKE 2 TABLETS EVERY DAY  60 tablet  4  . NASONEX 50 MCG/ACT nasal spray       . PRILOSEC OTC 20 MG tablet TAKE 1 TABLET EVERY DAY  42 tablet  3  . psyllium (METAMUCIL SMOOTH TEXTURE) 28 % packet Take 1 packet by mouth 2 (two) times daily.        . rosuvastatin (CRESTOR) 20 MG tablet Take 20 mg by mouth. Take 1 tab twice weekly on Tuesday and Friday        BP 124/80  Pulse 76  Temp 98.2 F (36.8 C)  Resp 16  Ht 5' 6.5" (1.689 m)  Wt 222 lb (100.699 kg)  BMI 35.30 kg/m2       Objective:   Physical Exam  Nursing note reviewed. Constitutional: She is oriented to person, place, and time. She appears well-developed and well-nourished. No distress.    HENT:  Head: Normocephalic and atraumatic.  Right Ear: External ear normal.  Left Ear: External ear normal.  Nose: Nose normal.  Mouth/Throat: Oropharynx is clear and moist.  Eyes: Conjunctivae and EOM are normal. Pupils are equal, round, and reactive to light.  Neck: Normal range of motion. Neck supple. No JVD present. No tracheal deviation present. No thyromegaly present.  Cardiovascular: Normal rate, regular rhythm, normal heart sounds and intact distal pulses.   No murmur heard. Pulmonary/Chest: Effort normal and breath sounds normal. She has no wheezes. She exhibits no tenderness.  Abdominal: Soft. Bowel sounds are normal. She exhibits distension. There is tenderness.  Musculoskeletal: Normal range of motion. She exhibits no edema and no tenderness.  Lymphadenopathy:    She has no cervical adenopathy.  Neurological: She is alert and oriented to person, place, and time. She has normal reflexes. No cranial nerve deficit.  Skin: Skin is warm and dry. She is not diaphoretic.  Psychiatric: She has a normal mood and affect. Her behavior is normal.          Assessment & Plan:  The patient has a history of dysmotility and is also on some probiotics at this time.  A balloon the proximal cause of the odor and the gas may be beneficial preparation that she takes so we will hold official and the bloating may be due to the tragus the Reglan to 5 mg 3 times a day with meals rather than the 10 at bedtime.  We'll monitor her symptomology I do not believe she has a malabsorption syndrome and that her stool has not changed but she will observe the color of the stool for [name].

## 2011-08-13 NOTE — Patient Instructions (Addendum)
Stop all fish oil products for the next 6 weeks  Next the Reglan in half and take a half or 5 mg of Reglan before each meal or 3 times a day  Stop the Prilosec and change to Nexium for the next 2 months

## 2011-09-24 ENCOUNTER — Ambulatory Visit (INDEPENDENT_AMBULATORY_CARE_PROVIDER_SITE_OTHER): Payer: BC Managed Care – PPO | Admitting: Internal Medicine

## 2011-09-24 ENCOUNTER — Encounter: Payer: Self-pay | Admitting: Internal Medicine

## 2011-09-24 VITALS — BP 136/80 | HR 72 | Temp 98.1°F | Resp 16 | Ht 66.0 in | Wt 223.0 lb

## 2011-09-24 DIAGNOSIS — K5901 Slow transit constipation: Secondary | ICD-10-CM

## 2011-09-24 DIAGNOSIS — K589 Irritable bowel syndrome without diarrhea: Secondary | ICD-10-CM

## 2011-09-24 MED ORDER — LINACLOTIDE 145 MCG PO CAPS: 1.0000 | ORAL_CAPSULE | Freq: Every day | ORAL | Status: DC

## 2011-09-24 NOTE — Progress Notes (Signed)
Subjective:    Patient ID: [name], female    DOB: July 13, 1950, 61 y.o.   MRN: [id number]  HPI GERD has improved as well as increased gas has improved with the reglan. However she has experienced side effects from the Reglan in that she has excessive somnolence and a sense of clouded sensorium.  She has not developed any extrapyramidal symptomology or tremor with her medications.  She has some midepigastric discomfort that is intermittent nonexertional and was probably related to her GERD she has constipation prone irritable bowel syndrome but denies any dark or tarry stools.    Review of Systems  Constitutional: Negative for activity change, appetite change and fatigue.  HENT: Negative for ear pain, congestion, neck pain, postnasal drip and sinus pressure.   Eyes: Negative for redness and visual disturbance.  Respiratory: Negative for cough, shortness of breath and wheezing.   Gastrointestinal: Negative for abdominal pain and abdominal distention.  Genitourinary: Negative for dysuria, frequency and menstrual problem.  Musculoskeletal: Negative for myalgias, joint swelling and arthralgias.  Skin: Negative for rash and wound.  Neurological: Negative for dizziness, weakness and headaches.  Hematological: Negative for adenopathy. Does not bruise/bleed easily.  Psychiatric/Behavioral: Negative for sleep disturbance and decreased concentration.   Past Medical History  Diagnosis Date  . Allergy   . Asthma   . Hyperlipidemia   . IBS (irritable bowel syndrome)   . Lumbar disc disease     History   Social History  . Marital Status: Married    Spouse Name: N/A    Number of Children: N/A  . Years of Education: N/A   Occupational History  . Not on file.   Social History Main Topics  . Smoking status: Never Smoker   . Smokeless tobacco: Not on file  . Alcohol Use: No  . Drug Use: No  . Sexually Active: Yes   Other Topics Concern  . Not on file   Social History  Narrative  . No narrative on file    Past Surgical History  Procedure Date  . Abdominal hysterectomy   . Oophorectomy   . Carpal tunnel release   . Esophagogastroduodenoscopy   . Rotator cuff repair   . Splenectomy   . Lasik   . Breast surgery     augmetation    Family History  Problem Relation Age of Onset  . Diabetes Mother   . Heart disease Father     Allergies  Allergen Reactions  . Penicillins     REACTION: rash    Current Outpatient Prescriptions on File Prior to Visit  Medication Sig Dispense Refill  . azelastine (ASTELIN) 137 MCG/SPRAY nasal spray 1 spray by Nasal route 2 (two) times daily. Use in each nostril as directed       . DULoxetine (CYMBALTA) 30 MG capsule Take 3 capsules (90 mg total) by mouth daily.  90 capsule  8  . esomeprazole (NEXIUM) 40 MG capsule Take 1 capsule (40 mg total) by mouth daily before breakfast.  30 capsule  2  . fish oil-omega-3 fatty acids 1000 MG capsule Take 2 g by mouth 2 (two) times daily.        [name] loratadine-pseudoephedrine (CLARITIN-D 24-HOUR) 10-240 MG per 24 hr tablet Take 1 tablet by mouth daily.        . nabumetone (RELAFEN) 750 MG tablet TAKE 2 TABLETS EVERY DAY  60 tablet  4  . NASONEX 50 MCG/ACT nasal spray       . PRILOSEC OTC 20  MG tablet TAKE 1 TABLET EVERY DAY  42 tablet  3  . psyllium (METAMUCIL SMOOTH TEXTURE) 28 % packet Take 1 packet by mouth 2 (two) times daily.        . rosuvastatin (CRESTOR) 20 MG tablet Take 20 mg by mouth. Take 1 tab twice weekly on Tuesday and Friday      . Linaclotide (LINZESS) 145 MCG CAPS Take 1 capsule by mouth daily.  30 capsule      BP 136/80  Pulse 72  Temp 98.1 F (36.7 C)  Resp 16  Ht 5\' 6"  (1.676 m)  Wt 223 lb (101.152 kg)  BMI 35.99 kg/m2        Objective:   Physical Exam  Nursing note and vitals reviewed. Constitutional: She is oriented to person, place, and time. She appears well-developed and well-nourished. No distress.  HENT:  Head: Normocephalic and  atraumatic.  Right Ear: External ear normal.  Left Ear: External ear normal.  Nose: Nose normal.  Mouth/Throat: Oropharynx is clear and moist.  Eyes: Conjunctivae and EOM are normal. Pupils are equal, round, and reactive to light.  Neck: Normal range of motion. Neck supple. No JVD present. No tracheal deviation present. No thyromegaly present.  Cardiovascular: Normal rate, regular rhythm, normal heart sounds and intact distal pulses.   No murmur heard. Pulmonary/Chest: Effort normal and breath sounds normal. She has no wheezes. She exhibits no tenderness.  Abdominal: Soft. Bowel sounds are normal.  Musculoskeletal: Normal range of motion. She exhibits no edema and no tenderness.  Lymphadenopathy:    She has no cervical adenopathy.  Neurological: She is alert and oriented to person, place, and time. She has normal reflexes. No cranial nerve deficit.  Skin: Skin is warm and dry. She is not diaphoretic.  Psychiatric: She has a normal mood and affect. Her behavior is normal.          Assessment & Plan:  Trial of Linzexx 145 as a promotility drugs and treatment for her irritable bowel which is more constipation problems we will start with this low dose medications to avoid complicating her gas and reflux picture we will discontinue the Reglan because of side effects at this time we have explained to her the side effects of the new drugs and the potential effects of a new drug and how this might aid in her irritable bowel syndrome controlled.  She is given a 30 day supply of the drug given careful right ear of what to monitor with the drug and will followup within 30 days

## 2011-09-24 NOTE — Patient Instructions (Signed)
The patient is instructed to continue all medications as prescribed. Schedule followup with check out clerk upon leaving the clinic  

## 2011-10-02 ENCOUNTER — Ambulatory Visit: Payer: BC Managed Care – PPO | Admitting: Internal Medicine

## 2011-10-07 ENCOUNTER — Telehealth: Payer: Self-pay | Admitting: *Deleted

## 2011-10-07 MED ORDER — DICYCLOMINE HCL 10 MG PO CAPS: 10.0000 mg | ORAL_CAPSULE | Freq: Three times a day (TID) | ORAL | Status: DC

## 2011-10-07 NOTE — Telephone Encounter (Signed)
Patient is aware and Rx sent 

## 2011-10-07 NOTE — Telephone Encounter (Signed)
Patient has been taking samples of Linzess? For her IBS for two weeks.  She is having nausea, vomiting, and diarrhea and would like to know if she can try something else?

## 2011-10-07 NOTE — Telephone Encounter (Signed)
Stop lizness and try bentyl 10 tid per dr Lovell Sheehan

## 2011-10-15 ENCOUNTER — Encounter: Payer: Self-pay | Admitting: Internal Medicine

## 2011-10-31 ENCOUNTER — Ambulatory Visit: Payer: BC Managed Care – PPO | Admitting: Internal Medicine

## 2011-11-13 ENCOUNTER — Encounter: Payer: Self-pay | Admitting: Internal Medicine

## 2011-11-13 ENCOUNTER — Ambulatory Visit (INDEPENDENT_AMBULATORY_CARE_PROVIDER_SITE_OTHER): Payer: BC Managed Care – PPO | Admitting: Internal Medicine

## 2011-11-13 VITALS — BP 138/82 | HR 78 | Ht 67.0 in | Wt 224.0 lb

## 2011-11-13 DIAGNOSIS — R110 Nausea: Secondary | ICD-10-CM

## 2011-11-13 DIAGNOSIS — K589 Irritable bowel syndrome without diarrhea: Secondary | ICD-10-CM

## 2011-11-13 DIAGNOSIS — R1012 Left upper quadrant pain: Secondary | ICD-10-CM

## 2011-11-13 DIAGNOSIS — R142 Eructation: Secondary | ICD-10-CM

## 2011-11-13 DIAGNOSIS — K219 Gastro-esophageal reflux disease without esophagitis: Secondary | ICD-10-CM

## 2011-11-13 DIAGNOSIS — Z860100 Personal history of colon polyps, unspecified: Secondary | ICD-10-CM

## 2011-11-13 DIAGNOSIS — R141 Gas pain: Secondary | ICD-10-CM

## 2011-11-13 DIAGNOSIS — R143 Flatulence: Secondary | ICD-10-CM

## 2011-11-13 MED ORDER — RIFAXIMIN 550 MG PO TABS: 550.0000 mg | ORAL_TABLET | Freq: Two times a day (BID) | ORAL | Status: DC

## 2011-11-13 MED ORDER — ONDANSETRON HCL 4 MG PO TABS: ORAL_TABLET | ORAL | Status: DC

## 2011-11-13 NOTE — Progress Notes (Signed)
HISTORY OF PRESENT ILLNESS:  [name] is a 61 y.o. female with a history of GERD, irritable bowel syndrome, anxiety, hyperlipidemia, and asthma. Patient has been evaluated in this office previously for atypical chest pain, chronic left-sided abdominal discomfort, GERD, gastroparesis, irritable bowel, and adenomatous colon polyps. She is referred today in consultation by Dr. Lovell Sheehan regarding new onset nausea, problems with intestinal gas, and left upper quadrant pain. The patient was last seen for screening colonoscopy in February 2011. Examination revealed diminutive adenomas. Otherwise normal. Followup in 5 years recommended. Her last upper endoscopy was performed in October of 2006. The examination was normal except for multiple benign-appearing fundic gland polyps. Biopsy-proven. The current history dates back several months when she developed problems with nausea. This symptom occurs mostly after meals and generally lasts about one hour. Symptoms are improved after consuming soda crackers and ginger ale. No vomiting. The symptoms can occur several times daily or she may go several days without problems. She states that this is severe and has affected her lifestyle, including the inability to exercise. She has had no weight loss. She saw her primary provider who discontinued fish oil, treated with Nexium, treated with Reglan 4 times a day, trial of Linzess, and Bentyl. None of the interventions were helpful, and several had side effects. She is status post cholecystectomy January 2012. She describes her bowel habits as every day or every other day. She uses Metamucil which helps. Next, she reports "horrible smelling gas". She cannot identify any exacerbating or relieving factors. The problem continues despite the regular use of probiotics Align and yogurt. She denies reflux symptoms or heartburn, though does have belching. Finally, she reports aching or sharp discomfort in the left upper quadrant  under the breast. Not clearly affected by meals or defecation. She denies stress. GI review of systems is otherwise negative. Review of outside laboratories finds normal liver tests from February 2013, except for mild elevation of alkaline phosphatase. No recent imaging studies. Review of old office records shows gastroparesis 2006. Zofran helped with nausea.  REVIEW OF SYSTEMS:  All non-GI ROS negative except for sinus and allergy trouble  Past Medical History  Diagnosis Date  . Allergy   . Asthma   . Hyperlipidemia   . IBS (irritable bowel syndrome)   . Lumbar disc disease   . Colon polyps     Past Surgical History  Procedure Date  . Abdominal hysterectomy   . Oophorectomy   . Carpal tunnel release   . Esophagogastroduodenoscopy   . Rotator cuff repair   . Splenectomy   . Lasik   . Breast surgery     augmetation    Social History [name]  reports that she has never smoked. She has never used smokeless tobacco. She reports that she does not drink alcohol or use illicit drugs.  family history includes Colon polyps in her maternal aunt and mother; Diabetes in her mother; Heart disease in her father; and Irritable bowel syndrome in her mother.  Allergies  Allergen Reactions  . Penicillins     REACTION: rash       PHYSICAL EXAMINATION: Vital signs: BP 138/82  Pulse 78  Ht 5\' 7"  (1.702 m)  Wt 224 lb (101.606 kg)  BMI 35.08 kg/m2  Constitutional:Obese, generally well-appearing, no acute distress Psychiatric: alert and oriented x3, cooperative Eyes: extraocular movements intact, anicteric, conjunctiva pink Mouth: oral pharynx moist, no lesions Neck: supple no lymphadenopathy Cardiovascular: heart regular rate and rhythm, no murmur Lungs: clear  to auscultation bilaterally Abdomen: soft, obese, nontender, nondistended, no obvious ascites, no peritoneal signs, normal bowel sounds, no organomegaly Rectal:omitted Extremities: no lower extremity edema  bilaterally Skin: no lesions on visible extremities Neuro: No focal deficits.   ASSESSMENT:  #1. Chronic nausea. New and persistent problems. No response to empiric therapies as outlined. Possible causes include gastric motility disturbance, medication reaction, or functional #2. Increased intestinal gas. New and persistent problems. No response to probiotics, yogurt. On fiber. Question bacterial overgrowth #3. GERD. Chronic stable problem on Nexium. #4. History of adenomatous colon polyps on colonoscopy February 2011 #5. Left upper quadrant/chest discomfort. Nonspecific. May be musculoskeletal or spasms   PLAN:  #1. Reflux precautions #2. Continue PPI #3. Prescribe Zofran 4 mg by mouth every 4 hours when necessary nausea. Recommended to take in the morning, prophylactically, to see if this helps #4. Follow nonabsorbable antibiotic Xifaxan 550 mg by mouth twice a day x2 weeks. 2 weeks of samples provided to her #5. Discussion on intestinal gas #6. Provide the patient with a brochure on intestinal gas as well as and anti-gas and flatulence dietary sheet #7. GI followup in 4-6 weeks #8. Symptoms persist at that time, consider upper endoscopy and repeat gastric emptying scan

## 2011-11-13 NOTE — Patient Instructions (Signed)
We have sent the following medications to your pharmacy for you to pick up at your convenience:  Zofran  We have also given you samples of Xifaxan to take twice a day until they are gone  Please follow up with Dr. Marina Goodell in 4-6 weeks

## 2011-11-26 ENCOUNTER — Ambulatory Visit: Payer: BC Managed Care – PPO | Admitting: Internal Medicine

## 2011-12-03 ENCOUNTER — Other Ambulatory Visit: Payer: Self-pay | Admitting: Internal Medicine

## 2011-12-18 ENCOUNTER — Ambulatory Visit: Payer: BC Managed Care – PPO | Admitting: Internal Medicine

## 2011-12-23 ENCOUNTER — Telehealth: Payer: Self-pay | Admitting: Internal Medicine

## 2011-12-23 NOTE — Telephone Encounter (Signed)
Caller: Margie/Patient; PCP: Darryll Capers; CB#: [phone]; ; ; Call regarding Migraine RX; Pt leaving town would like to take RX with her on trip incase Migraine occurs.  [facility], [facility].  Pt denies Migraine currently, refuses traige. Per Pt, "I was recently in to see MD".

## 2011-12-24 ENCOUNTER — Other Ambulatory Visit: Payer: Self-pay | Admitting: *Deleted

## 2011-12-24 MED ORDER — SUMATRIPTAN SUCCINATE 100 MG PO TABS: 100.0000 mg | ORAL_TABLET | ORAL | Status: DC | PRN

## 2011-12-24 NOTE — Telephone Encounter (Signed)
Per dr Yvonne Kendall have imitrex to take-sent to pharmacy

## 2011-12-25 ENCOUNTER — Other Ambulatory Visit: Payer: Self-pay | Admitting: *Deleted

## 2011-12-25 MED ORDER — SUMATRIPTAN SUCCINATE 100 MG PO TABS: 100.0000 mg | ORAL_TABLET | ORAL | Status: DC | PRN

## 2012-01-01 ENCOUNTER — Ambulatory Visit (INDEPENDENT_AMBULATORY_CARE_PROVIDER_SITE_OTHER): Payer: BC Managed Care – PPO | Admitting: Internal Medicine

## 2012-01-01 ENCOUNTER — Encounter: Payer: Self-pay | Admitting: Internal Medicine

## 2012-01-01 VITALS — BP 130/80 | HR 72 | Ht 66.5 in | Wt 230.5 lb

## 2012-01-01 DIAGNOSIS — R141 Gas pain: Secondary | ICD-10-CM

## 2012-01-01 DIAGNOSIS — R142 Eructation: Secondary | ICD-10-CM

## 2012-01-01 DIAGNOSIS — Z86010 Personal history of colonic polyps: Secondary | ICD-10-CM

## 2012-01-01 DIAGNOSIS — R110 Nausea: Secondary | ICD-10-CM

## 2012-01-01 DIAGNOSIS — K589 Irritable bowel syndrome without diarrhea: Secondary | ICD-10-CM

## 2012-01-01 DIAGNOSIS — K219 Gastro-esophageal reflux disease without esophagitis: Secondary | ICD-10-CM

## 2012-01-01 NOTE — Patient Instructions (Signed)
Please follow up as needed 

## 2012-01-01 NOTE — Progress Notes (Signed)
HISTORY OF PRESENT ILLNESS:  [name] is a 61 y.o. female with a history of GERD, IBS, anxiety, hyperlipidemia, and asthma. She has been evaluated in this office previously for atypical chest pain, chronic left-sided abdominal discomfort, GERD, IBS, gastroparesis, and adenomatous colon polyps. Her last office encounter was 11-13-11. She was being evaluated at that time for chronic nausea and increased intestinal gas. See that dictation. Prescribe Zofran for nausea and Xifaxan for gas. She presents at this time for followup. She is pleased to report that she is significantly improved. She recently returned from a trip to [place]. She did not require nausea medication. She's had absolutely no nausea for one week. As well, improvement in problems with intestinal gas. No new complaints. Her last upper endoscopy was performed in October of 2006. This was normal except for benign appearing fundic gland polyps. Last colonoscopy was in February of 2011. Small adenomas. Followup in 5 years recommended.  REVIEW OF SYSTEMS:  All non-GI ROS negative except for sinus and allergy trouble, arthritis, back pain, anxiety  Past Medical History  Diagnosis Date  . Allergy   . Asthma   . Hyperlipidemia   . IBS (irritable bowel syndrome)   . Lumbar disc disease   . Colon polyps     Past Surgical History  Procedure Date  . Abdominal hysterectomy   . Oophorectomy   . Carpal tunnel release   . Esophagogastroduodenoscopy   . Rotator cuff repair   . Splenectomy   . Lasik   . Breast surgery     augmetation    Social History [name]  reports that she has never smoked. She has never used smokeless tobacco. She reports that she does not drink alcohol or use illicit drugs.  family history includes Colon polyps in her maternal aunt and mother; Diabetes in her mother; Heart disease in her father; and Irritable bowel syndrome in her mother.  Allergies  Allergen Reactions  . Penicillins    REACTION: rash       PHYSICAL EXAMINATION: Vital signs: BP 130/80  Pulse 72  Ht 5' 6.5" (1.689 m)  Wt 230 lb 8 oz (104.554 kg)  BMI 36.65 kg/m2 General: Well-developed, well-nourished, no acute distress HEENT: Sclerae are anicteric, conjunctiva pink. Oral mucosa intact Lungs: Clear Heart: Regular Abdomen: soft, nontender, nondistended, no obvious ascites, no peritoneal signs, normal bowel sounds. No organomegaly. No succussion splash. Extremities: No edema Psychiatric: alert and oriented x3. Cooperative    ASSESSMENT:  #1. Chronic nausea. Improved #2. GERD. Stable #3. Increased intestinal gas. Improved after course of Xifaxan #4. IBS. Stable #5. History of adenomatous polyps. Last colonoscopy February 2011   PLAN:  #1. Zofran when necessary #2. Continue reflux precautions and PPI #3. Continue anti-gas and flatulence dietary measures #4. Surveillance colonoscopy around February 2016 #5. Interval GI followup as needed. Return to the care of Dr. Lovell Sheehan

## 2012-01-27 ENCOUNTER — Other Ambulatory Visit: Payer: Self-pay | Admitting: Internal Medicine

## 2012-02-21 ENCOUNTER — Ambulatory Visit (INDEPENDENT_AMBULATORY_CARE_PROVIDER_SITE_OTHER): Payer: BC Managed Care – PPO | Admitting: Internal Medicine

## 2012-02-21 ENCOUNTER — Encounter: Payer: Self-pay | Admitting: Internal Medicine

## 2012-02-21 VITALS — BP 130/90 | HR 74 | Temp 99.3°F | Wt 226.0 lb

## 2012-02-21 DIAGNOSIS — K589 Irritable bowel syndrome without diarrhea: Secondary | ICD-10-CM

## 2012-02-21 DIAGNOSIS — K9041 Non-celiac gluten sensitivity: Secondary | ICD-10-CM

## 2012-02-21 DIAGNOSIS — R109 Unspecified abdominal pain: Secondary | ICD-10-CM

## 2012-02-21 DIAGNOSIS — K900 Celiac disease: Secondary | ICD-10-CM

## 2012-02-21 MED ORDER — ONDANSETRON HCL 4 MG PO TABS: ORAL_TABLET | ORAL | Status: DC

## 2012-02-21 MED ORDER — LEVOFLOXACIN 500 MG PO TABS: 500.0000 mg | ORAL_TABLET | Freq: Every day | ORAL | Status: AC

## 2012-02-21 NOTE — Progress Notes (Signed)
  Subjective:    Patient ID: [name], female    DOB: 11/24/1950, 61 y.o.   MRN: [id number]  HPI    Review of Systems     Objective:   Physical Exam        Assessment & Plan:

## 2012-02-21 NOTE — Patient Instructions (Addendum)
The patient is instructed to continue all medications as prescribed. Schedule followup with check out clerk upon leaving the clinic I'm going to testing for sprue Would like you to follow a gluten-free diet that I give any I would like you to avoid soft drinks and artificial sweeteners other than Stevia and honey or raw sugar drinking water Smart water herbal teas and coffees

## 2012-02-25 ENCOUNTER — Telehealth: Payer: Self-pay | Admitting: Internal Medicine

## 2012-02-25 LAB — GLIA (IGA/G) + TTG IGA
Gliadin IgA: 2.6 U/mL (ref <20)
Gliadin IgG: 3.7 U/mL (ref <20)
Tissue Transglutaminase Ab, IgA: 3.2 U/mL (ref <20)

## 2012-02-25 NOTE — Telephone Encounter (Signed)
Pt called and said that she was given infor form Dr Lovell Sheehan re: gluten free diet and was told to try the diet for a couple of months. Pt wants to know if she is suppose to sch a 2-3 month fup to discuss diet results.

## 2012-02-26 NOTE — Telephone Encounter (Signed)
Yes she was

## 2012-02-26 NOTE — Progress Notes (Signed)
Quick Note:  Left a message for return call. ______ 

## 2012-02-26 NOTE — Telephone Encounter (Signed)
Called pt and schd for fup ov re: gluten freel diet on 05/12/12 at 11:15am, as noted.

## 2012-03-05 ENCOUNTER — Ambulatory Visit (INDEPENDENT_AMBULATORY_CARE_PROVIDER_SITE_OTHER): Payer: BC Managed Care – PPO | Admitting: Internal Medicine

## 2012-03-05 ENCOUNTER — Encounter: Payer: Self-pay | Admitting: Internal Medicine

## 2012-03-05 VITALS — BP 118/80 | Temp 98.2°F | Wt 229.0 lb

## 2012-03-05 DIAGNOSIS — M25569 Pain in unspecified knee: Secondary | ICD-10-CM

## 2012-03-05 DIAGNOSIS — Z23 Encounter for immunization: Secondary | ICD-10-CM

## 2012-03-05 DIAGNOSIS — M25561 Pain in right knee: Secondary | ICD-10-CM

## 2012-03-05 NOTE — Progress Notes (Signed)
Subjective:    Patient ID: [name], female    DOB: [birth year], 61 y.o.   MRN: [id number]  HPI  61 year old patient who has a history of obesity. She also has a history of remote surgery involving the right knee do to a dislocated patella at age 61. For the past 7-10 days she has had persistent right knee pain medially. While walking she noted acute sharp pain in the medial right knee. This has persisted in spite of the daily use of anti-inflammatory medications. She remains quite active including exercise on a treadmill 3 times weekly  Past Medical History  Diagnosis Date  . Allergy   . Asthma   . Hyperlipidemia   . IBS (irritable bowel syndrome)   . Lumbar disc disease   . Colon polyps     History   Social History  . Marital Status: Married    Spouse Name: N/A    Number of Children: 1  . Years of Education: N/A   Occupational History  . Retired    Social History Main Topics  . Smoking status: Never Smoker   . Smokeless tobacco: Never Used  . Alcohol Use: No  . Drug Use: No  . Sexually Active: Yes   Other Topics Concern  . Not on file   Social History Narrative  . No narrative on file    Past Surgical History  Procedure Date  . Abdominal hysterectomy   . Oophorectomy   . Carpal tunnel release   . Esophagogastroduodenoscopy   . Rotator cuff repair   . Splenectomy   . Lasik   . Breast surgery     augmetation    Family History  Problem Relation Age of Onset  . Diabetes Mother   . Heart disease Father   . Colon polyps Mother   . Colon polyps Maternal Aunt   . Irritable bowel syndrome Mother     Allergies  Allergen Reactions  . Penicillins     REACTION: rash    Current Outpatient Prescriptions on File Prior to Visit  Medication Sig Dispense Refill  . azelastine (ASTELIN) 137 MCG/SPRAY nasal spray 1 spray by Nasal route 2 (two) times daily. Use in each nostril as directed       . DULoxetine (CYMBALTA) 30 MG capsule Take 3 capsules (90 mg  total) by mouth daily.  90 capsule  8  . estradiol (ESTRACE) 0.5 MG tablet Take 0.5 mg by mouth daily.      [name] KLS ALLERCLEAR D-24HR 10-240 MG per 24 hr tablet TAKE 1 TABLET BY MOUTH ONCE DAILY  30 tablet  11  . metoCLOPramide (REGLAN) 10 MG tablet Take 10 mg by mouth at bedtime.      . nabumetone (RELAFEN) 750 MG tablet TAKE 2 TABLETS EVERY DAY  60 tablet  4  . NASONEX 50 MCG/ACT nasal spray       . ondansetron (ZOFRAN) 4 MG tablet Take one tablet every 4 hours as needed  60 tablet  2  . PRILOSEC OTC 20 MG tablet TAKE 1 TABLET EVERY DAY  42 tablet  3  . Probiotic Product (ALIGN) 4 MG CAPS Take 4 mg by mouth daily.      . psyllium (METAMUCIL SMOOTH TEXTURE) 28 % packet Take 1 packet by mouth 2 (two) times daily.        . rosuvastatin (CRESTOR) 20 MG tablet Take 20 mg by mouth. Take 1 tab twice weekly on Tuesday and Friday      .  SUMAtriptan (IMITREX) 100 MG tablet Take 1 tablet (100 mg total) by mouth every 2 (two) hours as needed for migraine.  10 tablet  0    BP 118/80  Temp 98.2 F (36.8 C) (Oral)  Wt 229 lb (103.874 kg)       Review of Systems  Musculoskeletal: Positive for arthralgias (right knee pain).       Objective:   Physical Exam  Musculoskeletal:       Surgical scar right lateral knee Right knee was slightly warm to touch compared to the left Some tenderness noted along the right medial joint line          Assessment & Plan:   R Knee pain-  Continue Relafen;  rest ;  Possible degenerated med meniscus-  Ortho if unimproved

## 2012-03-05 NOTE — Patient Instructions (Signed)
Continue Relafen once daily  You  may move around, but avoid painful motions and activities.  Apply ice to the sore area for 15 to 20 minutes 3 or 4 times daily for the next two to 3 days especially after overuse  Followup orthopedics if unimproved

## 2012-04-03 ENCOUNTER — Encounter (HOSPITAL_COMMUNITY): Payer: Self-pay | Admitting: Pharmacy Technician

## 2012-04-08 NOTE — Pre-Procedure Instructions (Signed)
20 [name]  04/08/2012   Your procedure is scheduled on:  Wednesday April 15, 2012  Report to [facility] Short Stay Center at 10:00AM.  Call this number if you have problems the morning of surgery: [phone]   Remember:   Do not eat food or drink :After Midnight.      Take these medicines the morning of surgery with A SIP OF WATER: astelin, cymbalta, estradiol, allegra, nasonex, omeprazole, imitrex   Do not wear jewelry, make-up or nail polish.  Do not wear lotions, powders, or perfumes.  Do not shave 48 hours prior to surgery. Men may shave face and neck.  Do not bring valuables to the hospital.  Contacts, dentures or bridgework may not be worn into surgery.  Leave suitcase in the car. After surgery it may be brought to your room.  For patients admitted to the hospital, checkout time is 11:00 AM the day of discharge.   Patients discharged the day of surgery will not be allowed to drive home.  Name and phone number of your driver: family / friend  Special Instructions: Incentive Spirometry - Practice and bring it with you on the day of surgery. Shower using CHG 2 nights before surgery and the night before surgery.  If you shower the day of surgery use CHG.  Use special wash - you have one bottle of CHG for all showers.  You should use approximately 1/3 of the bottle for each shower.   Please read over the following fact sheets that you were given: Pain Booklet, Coughing and Deep Breathing, Blood Transfusion Information, Total Joint Packet, MRSA Information and Surgical Site Infection Prevention

## 2012-04-09 ENCOUNTER — Encounter (HOSPITAL_COMMUNITY)
Admission: RE | Admit: 2012-04-09 | Discharge: 2012-04-09 | Disposition: A | Discharge status: Home / Self Care | Payer: BC Managed Care – PPO | Source: Ambulatory Visit | Attending: Orthopedic Surgery | Admitting: Orthopedic Surgery

## 2012-04-09 ENCOUNTER — Encounter (HOSPITAL_COMMUNITY): Payer: Self-pay

## 2012-04-09 ENCOUNTER — Encounter (HOSPITAL_COMMUNITY)
Admission: RE | Admit: 2012-04-09 | Discharge: 2012-04-09 | Disposition: A | Discharge status: Home / Self Care | Payer: BC Managed Care – PPO | Source: Ambulatory Visit | Attending: Surgery | Admitting: Surgery

## 2012-04-09 HISTORY — DX: Anxiety disorder, unspecified: F41.9

## 2012-04-09 HISTORY — DX: Gastro-esophageal reflux disease without esophagitis: K21.9

## 2012-04-09 HISTORY — DX: Unspecified osteoarthritis, unspecified site: M19.90

## 2012-04-09 LAB — URINALYSIS, ROUTINE W REFLEX MICROSCOPIC
Bilirubin Urine: NEGATIVE
Glucose, UA: NEGATIVE mg/dL
Hgb urine dipstick: NEGATIVE
Ketones, ur: NEGATIVE mg/dL
Leukocytes, UA: NEGATIVE
Nitrite: NEGATIVE
Protein, ur: NEGATIVE mg/dL
Specific Gravity, Urine: 1.008 (ref 1.005–1.030)
Urobilinogen, UA: 0.2 mg/dL (ref 0.0–1.0)
pH: 6.5 (ref 5.0–8.0)

## 2012-04-09 LAB — PROTIME-INR
INR: 0.97 (ref 0.00–1.49)
Prothrombin Time: 12.8 seconds (ref 11.6–15.2)

## 2012-04-09 LAB — CBC
HCT: 44.3 % (ref 36.0–46.0)
Hemoglobin: 15.1 g/dL — ABNORMAL HIGH (ref 12.0–15.0)
MCH: 31.3 pg (ref 26.0–34.0)
MCHC: 34.1 g/dL (ref 30.0–36.0)
MCV: 91.7 fL (ref 78.0–100.0)
Platelets: 318 10*3/uL (ref 150–400)
RBC: 4.83 MIL/uL (ref 3.87–5.11)
RDW: 11.9 % (ref 11.5–15.5)
WBC: 12.4 10*3/uL — ABNORMAL HIGH (ref 4.0–10.5)

## 2012-04-09 LAB — COMPREHENSIVE METABOLIC PANEL
ALT: 15 U/L (ref 0–35)
AST: 20 U/L (ref 0–37)
Albumin: 4 g/dL (ref 3.5–5.2)
Alkaline Phosphatase: 145 U/L — ABNORMAL HIGH (ref 39–117)
BUN: 15 mg/dL (ref 6–23)
CO2: 28 mEq/L (ref 19–32)
Calcium: 9.4 mg/dL (ref 8.4–10.5)
Chloride: 100 mEq/L (ref 96–112)
Creatinine, Ser: 0.61 mg/dL (ref 0.50–1.10)
GFR calc Af Amer: >90 mL/min (ref >90)
GFR calc non Af Amer: >90 mL/min (ref >90)
Glucose, Bld: 96 mg/dL (ref 70–99)
Potassium: 4.1 mEq/L (ref 3.5–5.1)
Sodium: 139 mEq/L (ref 135–145)
Total Bilirubin: 0.3 mg/dL (ref 0.3–1.2)
Total Protein: 7.4 g/dL (ref 6.0–8.3)

## 2012-04-09 LAB — TYPE AND SCREEN
ABO/RH(D): A POS
Antibody Screen: NEGATIVE

## 2012-04-09 LAB — APTT: aPTT: 29 seconds (ref 24–37)

## 2012-04-09 LAB — SURGICAL PCR SCREEN
MRSA, PCR: INVALID — AB
Staphylococcus aureus: INVALID — AB

## 2012-04-09 LAB — ABO/RH: ABO/RH(D): A POS

## 2012-04-09 NOTE — Progress Notes (Signed)
Call to Lab, Micro, told that PCR was invalid & that they are running a MRSA culture only.  MRSA culture will take 48 hrs. For results.

## 2012-04-11 LAB — MRSA CULTURE

## 2012-04-14 MED ORDER — VANCOMYCIN HCL 1000 MG IV SOLR
1500.0000 mg | INTRAVENOUS | Status: AC
  Administered 2012-04-15: 1500 mg via INTRAVENOUS
  Filled 2012-04-14: qty 1500

## 2012-04-14 NOTE — H&P (Signed)
  [facility] [address]   [address] [place], [place] [address] [phone] A Division of [facility]  Loreta Ave, M.D.   Robert A. Thurston Hole, M.D.   Burnell Blanks, M.D.   Eulas Post, M.D.   Lunette Stands, M.D Buford Dresser, M.D.  Charlsie Quest, M.D.   Estell Harpin, M.D.   Melina Fiddler, M.D. Genene Churn. Barry Dienes, PA-C            Kirstin A. Shepperson, PA-C Josh Craig, PA-C [place], [place]   RE: [place], [place]   [id number]      DOB: 05-10-1951 PROGRESS NOTE: 04-07-12 Chief complaint: right knee pain. History of present illness: 61 year old white female with known history of right knee patellofemoral degenerative joint disease. Pain unchanged and she wants to proceed with right knee arthroscopy and patellofemoral unicompartmental replacement versus total knee replacement Current medications: Astelin, Cymbalta, Estrace, KLS, Alercler, Reglan, Relafen, Nasonex, Zofran, Prilosec, Align, Metamucil, Crestor, Imitrex. Drug allergy to penicillin. Past surgical history: right knee surgery 1980's hysterectomy carpal tunnel release left shoulder rotator cuff repair bladder sling procedure cholecystectomy depression migraines GERD. Family history: positive heart disease stroke cancer. Social history: she's married, denies smoking or alcohol use. Review of systems: negative for light headedness dizziness fever chills cardiac pulmonary GI GU or neuro issues.   EXAMINATION: Height 5'7" weight 220 pounds. Temp 98.8. Respirations 16. Blood pressure 146/90. Pulse 85. Alert and oriented x3 in no acute distress. No increase in respiratory effort. Gait is antalgic. [name] Heart regular rate and rhythm no murmurs noted. Abdomen round non-distended. NBSx4 soft non-tender. Right knee good range of motion positive patellofemoral crepitus and grind small effusion ligaments stable. Calf non-tender neurovascularly intact. Skin warm  and dry.   X-RAYS: Previous x-rays show right knee end stage degenerative joint disease patellofemoral joint medial and lateral compartments look good.  IMPRESSION: Right knee end stage degenerative joint disease patellofemoral compartment.  DISPOSITION: Will proceed with right knee arthroscopy with debridement and patellofemoral unicompartmental replacement versus total knee replacement. All questions answered.  Loreta Ave, M.D.  Electronically verified by Loreta Ave, M.D. DFM(JMO):kh D 04-09-12 T 04-10-12

## 2012-04-14 NOTE — Progress Notes (Signed)
NOTIFIED Tran Mckinzie OF TIME CHANGE , INSTRUCTED TO ARRIVE AT 800 AM ON 04/15/12.

## 2012-04-15 ENCOUNTER — Inpatient Hospital Stay (HOSPITAL_COMMUNITY): Payer: BC Managed Care – PPO

## 2012-04-15 ENCOUNTER — Encounter (HOSPITAL_COMMUNITY)
Admission: RE | Disposition: A | Discharge status: Home Health | Payer: Self-pay | Source: Ambulatory Visit | Attending: Orthopedic Surgery

## 2012-04-15 ENCOUNTER — Encounter (HOSPITAL_COMMUNITY): Payer: Self-pay | Admitting: *Deleted

## 2012-04-15 ENCOUNTER — Encounter (HOSPITAL_COMMUNITY): Payer: Self-pay | Admitting: Vascular Surgery

## 2012-04-15 ENCOUNTER — Inpatient Hospital Stay (HOSPITAL_COMMUNITY): Payer: BC Managed Care – PPO | Admitting: Anesthesiology

## 2012-04-15 ENCOUNTER — Inpatient Hospital Stay (HOSPITAL_COMMUNITY)
Admission: RE | Admit: 2012-04-15 | Discharge: 2012-04-18 | DRG: 209 | Disposition: A | Discharge status: Home Health | Payer: BC Managed Care – PPO | Source: Ambulatory Visit | Attending: Orthopedic Surgery | Admitting: Orthopedic Surgery

## 2012-04-15 ENCOUNTER — Encounter (HOSPITAL_COMMUNITY): Payer: Self-pay | Admitting: Anesthesiology

## 2012-04-15 DIAGNOSIS — F3289 Other specified depressive episodes: Secondary | ICD-10-CM | POA: Diagnosis present

## 2012-04-15 DIAGNOSIS — E1169 Type 2 diabetes mellitus with other specified complication: Secondary | ICD-10-CM | POA: Diagnosis present

## 2012-04-15 DIAGNOSIS — F329 Major depressive disorder, single episode, unspecified: Secondary | ICD-10-CM | POA: Diagnosis present

## 2012-04-15 DIAGNOSIS — M545 Low back pain, unspecified: Secondary | ICD-10-CM | POA: Diagnosis present

## 2012-04-15 DIAGNOSIS — M2390 Unspecified internal derangement of unspecified knee: Secondary | ICD-10-CM | POA: Diagnosis present

## 2012-04-15 DIAGNOSIS — Z01818 Encounter for other preprocedural examination: Secondary | ICD-10-CM

## 2012-04-15 DIAGNOSIS — Z01812 Encounter for preprocedural laboratory examination: Secondary | ICD-10-CM

## 2012-04-15 DIAGNOSIS — Z01810 Encounter for preprocedural cardiovascular examination: Secondary | ICD-10-CM

## 2012-04-15 DIAGNOSIS — R634 Abnormal weight loss: Secondary | ICD-10-CM | POA: Diagnosis present

## 2012-04-15 DIAGNOSIS — F419 Anxiety disorder, unspecified: Secondary | ICD-10-CM

## 2012-04-15 DIAGNOSIS — Z79899 Other long term (current) drug therapy: Secondary | ICD-10-CM

## 2012-04-15 DIAGNOSIS — J45909 Unspecified asthma, uncomplicated: Secondary | ICD-10-CM | POA: Diagnosis present

## 2012-04-15 DIAGNOSIS — K219 Gastro-esophageal reflux disease without esophagitis: Secondary | ICD-10-CM | POA: Diagnosis present

## 2012-04-15 DIAGNOSIS — H6980 Other specified disorders of Eustachian tube, unspecified ear: Secondary | ICD-10-CM | POA: Diagnosis present

## 2012-04-15 DIAGNOSIS — Z9889 Other specified postprocedural states: Secondary | ICD-10-CM | POA: Diagnosis present

## 2012-04-15 DIAGNOSIS — M1710 Unilateral primary osteoarthritis, unspecified knee: Principal | ICD-10-CM | POA: Diagnosis present

## 2012-04-15 DIAGNOSIS — G43909 Migraine, unspecified, not intractable, without status migrainosus: Secondary | ICD-10-CM | POA: Diagnosis present

## 2012-04-15 DIAGNOSIS — E785 Hyperlipidemia, unspecified: Secondary | ICD-10-CM | POA: Diagnosis present

## 2012-04-15 DIAGNOSIS — K589 Irritable bowel syndrome without diarrhea: Secondary | ICD-10-CM | POA: Diagnosis present

## 2012-04-15 DIAGNOSIS — F32A Depression, unspecified: Secondary | ICD-10-CM

## 2012-04-15 DIAGNOSIS — J309 Allergic rhinitis, unspecified: Secondary | ICD-10-CM | POA: Diagnosis present

## 2012-04-15 HISTORY — PX: KNEE ARTHROSCOPY: SHX127

## 2012-04-15 HISTORY — PX: TOTAL KNEE ARTHROPLASTY: SHX125

## 2012-04-15 SURGERY — ARTHROSCOPY, KNEE
Anesthesia: General | Site: Knee | Laterality: Right | Wound class: Clean

## 2012-04-15 MED ORDER — ONDANSETRON HCL 4 MG/2ML IJ SOLN: 4.0000 mg | Freq: Four times a day (QID) | INTRAMUSCULAR | Status: DC | PRN

## 2012-04-15 MED ORDER — PANTOPRAZOLE SODIUM 40 MG PO TBEC
40.0000 mg | DELAYED_RELEASE_TABLET | Freq: Every day | ORAL | Status: DC
  Administered 2012-04-16 – 2012-04-18 (×3): 40 mg via ORAL
  Filled 2012-04-15 (×3): qty 1

## 2012-04-15 MED ORDER — LACTATED RINGERS IV SOLN
INTRAVENOUS | Status: DC
  Administered 2012-04-15: 12:00:00 via INTRAVENOUS

## 2012-04-15 MED ORDER — COUMADIN BOOK
Freq: Once | Status: AC
  Administered 2012-04-15: 18:00:00
  Filled 2012-04-15: qty 1

## 2012-04-15 MED ORDER — OXYCODONE-ACETAMINOPHEN 5-325 MG PO TABS
1.0000 | ORAL_TABLET | ORAL | Status: DC | PRN
Start: 2012-04-15 — End: 2012-04-18
  Administered 2012-04-15 – 2012-04-18 (×10): 2 via ORAL
  Administered 2012-04-18: 1 via ORAL
  Filled 2012-04-15 (×3): qty 2
  Filled 2012-04-15: qty 1
  Filled 2012-04-15 (×6): qty 2

## 2012-04-15 MED ORDER — WARFARIN - PHARMACIST DOSING INPATIENT: Freq: Every day | Status: DC

## 2012-04-15 MED ORDER — FENTANYL CITRATE 0.05 MG/ML IJ SOLN
INTRAMUSCULAR | Status: DC | PRN
  Administered 2012-04-15 (×2): 100 ug via INTRAVENOUS
  Administered 2012-04-15 (×4): 50 ug via INTRAVENOUS

## 2012-04-15 MED ORDER — BUPIVACAINE HCL (PF) 0.25 % IJ SOLN
INTRAMUSCULAR | Status: DC | PRN
  Administered 2012-04-15: 30 mL

## 2012-04-15 MED ORDER — VANCOMYCIN HCL IN DEXTROSE 1-5 GM/200ML-% IV SOLN
1000.0000 mg | Freq: Two times a day (BID) | INTRAVENOUS | Status: AC
  Administered 2012-04-15 – 2012-04-16 (×2): 1000 mg via INTRAVENOUS
  Filled 2012-04-15 (×2): qty 200

## 2012-04-15 MED ORDER — OXYCODONE-ACETAMINOPHEN 5-325 MG PO TABS
ORAL_TABLET | ORAL | Status: AC
  Filled 2012-04-15: qty 2

## 2012-04-15 MED ORDER — FLUTICASONE PROPIONATE 50 MCG/ACT NA SUSP
1.0000 | Freq: Every day | NASAL | Status: DC
  Administered 2012-04-16: 1 via NASAL
  Filled 2012-04-15: qty 16

## 2012-04-15 MED ORDER — MIDAZOLAM HCL 5 MG/5ML IJ SOLN
INTRAMUSCULAR | Status: DC | PRN
  Administered 2012-04-15: 2 mg via INTRAVENOUS

## 2012-04-15 MED ORDER — ENOXAPARIN SODIUM 30 MG/0.3ML ~~LOC~~ SOLN
30.0000 mg | Freq: Two times a day (BID) | SUBCUTANEOUS | Status: DC
  Administered 2012-04-16 – 2012-04-18 (×5): 30 mg via SUBCUTANEOUS
  Filled 2012-04-15 (×8): qty 0.3

## 2012-04-15 MED ORDER — ARTIFICIAL TEARS OP OINT
TOPICAL_OINTMENT | OPHTHALMIC | Status: DC | PRN
  Administered 2012-04-15: 1 via OPHTHALMIC

## 2012-04-15 MED ORDER — SENNOSIDES-DOCUSATE SODIUM 8.6-50 MG PO TABS: 1.0000 | ORAL_TABLET | Freq: Every evening | ORAL | Status: DC | PRN

## 2012-04-15 MED ORDER — ONDANSETRON HCL 4 MG PO TABS: 4.0000 mg | ORAL_TABLET | Freq: Four times a day (QID) | ORAL | Status: DC | PRN

## 2012-04-15 MED ORDER — LORATADINE 10 MG PO TABS
10.0000 mg | ORAL_TABLET | Freq: Every day | ORAL | Status: DC
  Administered 2012-04-15 – 2012-04-17 (×3): 10 mg via ORAL
  Filled 2012-04-15 (×5): qty 1

## 2012-04-15 MED ORDER — HYDROMORPHONE HCL PF 1 MG/ML IJ SOLN
1.0000 mg | INTRAMUSCULAR | Status: DC | PRN
  Administered 2012-04-15 – 2012-04-16 (×3): 1 mg via INTRAVENOUS
  Filled 2012-04-15 (×3): qty 1

## 2012-04-15 MED ORDER — HYDROMORPHONE HCL PF 1 MG/ML IJ SOLN: 0.2500 mg | INTRAMUSCULAR | Status: DC | PRN

## 2012-04-15 MED ORDER — MENTHOL 3 MG MT LOZG: 1.0000 | LOZENGE | OROMUCOSAL | Status: DC | PRN

## 2012-04-15 MED ORDER — ACETAMINOPHEN 650 MG RE SUPP: 650.0000 mg | Freq: Four times a day (QID) | RECTAL | Status: DC | PRN

## 2012-04-15 MED ORDER — POTASSIUM CHLORIDE IN NACL 20-0.9 MEQ/L-% IV SOLN
INTRAVENOUS | Status: DC
  Administered 2012-04-15 – 2012-04-16 (×2): via INTRAVENOUS
  Filled 2012-04-15 (×6): qty 1000

## 2012-04-15 MED ORDER — ONDANSETRON HCL 4 MG/2ML IJ SOLN
INTRAMUSCULAR | Status: DC | PRN
  Administered 2012-04-15: 4 mg via INTRAVENOUS

## 2012-04-15 MED ORDER — ACETAMINOPHEN 325 MG PO TABS: 650.0000 mg | ORAL_TABLET | Freq: Four times a day (QID) | ORAL | Status: DC | PRN

## 2012-04-15 MED ORDER — METOCLOPRAMIDE HCL 10 MG PO TABS: 5.0000 mg | ORAL_TABLET | Freq: Three times a day (TID) | ORAL | Status: DC | PRN

## 2012-04-15 MED ORDER — METHOCARBAMOL 500 MG PO TABS
500.0000 mg | ORAL_TABLET | Freq: Four times a day (QID) | ORAL | Status: DC | PRN
  Administered 2012-04-15 – 2012-04-17 (×3): 500 mg via ORAL
  Filled 2012-04-15 (×3): qty 1

## 2012-04-15 MED ORDER — WARFARIN SODIUM 7.5 MG PO TABS
7.5000 mg | ORAL_TABLET | Freq: Once | ORAL | Status: AC
  Administered 2012-04-15: 7.5 mg via ORAL
  Filled 2012-04-15: qty 1

## 2012-04-15 MED ORDER — LACTATED RINGERS IV SOLN
INTRAVENOUS | Status: DC | PRN
  Administered 2012-04-15 (×2): via INTRAVENOUS

## 2012-04-15 MED ORDER — WARFARIN VIDEO: Freq: Once | Status: DC

## 2012-04-15 MED ORDER — METHOCARBAMOL 100 MG/ML IJ SOLN
500.0000 mg | Freq: Four times a day (QID) | INTRAVENOUS | Status: DC | PRN
  Filled 2012-04-15: qty 5

## 2012-04-15 MED ORDER — ATORVASTATIN CALCIUM 40 MG PO TABS
40.0000 mg | ORAL_TABLET | Freq: Every day | ORAL | Status: DC
  Administered 2012-04-17: 40 mg via ORAL
  Filled 2012-04-15 (×3): qty 1

## 2012-04-15 MED ORDER — PNEUMOCOCCAL VAC POLYVALENT 25 MCG/0.5ML IJ INJ
0.5000 mL | INJECTION | INTRAMUSCULAR | Status: AC
  Filled 2012-04-15 (×2): qty 0.5

## 2012-04-15 MED ORDER — SODIUM CHLORIDE 0.9 % IV SOLN
INTRAVENOUS | Status: DC | PRN
  Administered 2012-04-15: 12:00:00 via INTRAVENOUS

## 2012-04-15 MED ORDER — ONDANSETRON HCL 4 MG/2ML IJ SOLN: 4.0000 mg | Freq: Once | INTRAMUSCULAR | Status: DC | PRN

## 2012-04-15 MED ORDER — PSEUDOEPHEDRINE HCL ER 120 MG PO TB12
120.0000 mg | ORAL_TABLET | Freq: Two times a day (BID) | ORAL | Status: DC
  Administered 2012-04-15 – 2012-04-18 (×5): 120 mg via ORAL
  Filled 2012-04-15 (×8): qty 1

## 2012-04-15 MED ORDER — METHOCARBAMOL 500 MG PO TABS
ORAL_TABLET | ORAL | Status: AC
  Filled 2012-04-15: qty 1

## 2012-04-15 MED ORDER — MORPHINE SULFATE 4 MG/ML IJ SOLN
INTRAMUSCULAR | Status: AC
  Filled 2012-04-15: qty 1

## 2012-04-15 MED ORDER — MORPHINE SULFATE 4 MG/ML IJ SOLN
INTRAMUSCULAR | Status: DC | PRN
  Administered 2012-04-15: 4 mg via INTRAMUSCULAR

## 2012-04-15 MED ORDER — LIDOCAINE HCL (CARDIAC) 20 MG/ML IV SOLN
INTRAVENOUS | Status: DC | PRN
  Administered 2012-04-15: 60 mg via INTRAVENOUS

## 2012-04-15 MED ORDER — AZELASTINE HCL 0.1 % NA SOLN
1.0000 | Freq: Two times a day (BID) | NASAL | Status: DC
  Administered 2012-04-16 – 2012-04-18 (×4): 1 via NASAL
  Filled 2012-04-15 (×2): qty 30

## 2012-04-15 MED ORDER — ACETAMINOPHEN 10 MG/ML IV SOLN: 1000.0000 mg | Freq: Once | INTRAVENOUS | Status: DC | PRN

## 2012-04-15 MED ORDER — DULOXETINE HCL 60 MG PO CPEP
90.0000 mg | ORAL_CAPSULE | Freq: Every day | ORAL | Status: DC
  Administered 2012-04-16 – 2012-04-18 (×3): 90 mg via ORAL
  Filled 2012-04-15 (×3): qty 1

## 2012-04-15 MED ORDER — BISACODYL 10 MG RE SUPP: 10.0000 mg | Freq: Every day | RECTAL | Status: DC | PRN

## 2012-04-15 MED ORDER — PHENOL 1.4 % MT LIQD: 1.0000 | OROMUCOSAL | Status: DC | PRN

## 2012-04-15 MED ORDER — DOCUSATE SODIUM 100 MG PO CAPS
100.0000 mg | ORAL_CAPSULE | Freq: Two times a day (BID) | ORAL | Status: DC
  Administered 2012-04-15 – 2012-04-18 (×6): 100 mg via ORAL
  Filled 2012-04-15 (×8): qty 1

## 2012-04-15 MED ORDER — SUMATRIPTAN SUCCINATE 100 MG PO TABS
100.0000 mg | ORAL_TABLET | ORAL | Status: DC | PRN
  Filled 2012-04-15: qty 1

## 2012-04-15 MED ORDER — FEXOFENADINE-PSEUDOEPHED ER 180-240 MG PO TB24: 1.0000 | ORAL_TABLET | Freq: Every day | ORAL | Status: DC

## 2012-04-15 MED ORDER — BUPIVACAINE HCL (PF) 0.25 % IJ SOLN
INTRAMUSCULAR | Status: AC
  Filled 2012-04-15: qty 30

## 2012-04-15 MED ORDER — METOCLOPRAMIDE HCL 5 MG/ML IJ SOLN: 5.0000 mg | Freq: Three times a day (TID) | INTRAMUSCULAR | Status: DC | PRN

## 2012-04-15 MED ORDER — SODIUM CHLORIDE 0.9 % IR SOLN
Status: DC | PRN
  Administered 2012-04-15: 3000 mL

## 2012-04-15 MED ORDER — PROPOFOL 10 MG/ML IV BOLUS
INTRAVENOUS | Status: DC | PRN
  Administered 2012-04-15: 170 mg via INTRAVENOUS

## 2012-04-15 SURGICAL SUPPLY — 77 items
BANDAGE ELASTIC 6 VELCRO ST LF (GAUZE/BANDAGES/DRESSINGS) IMPLANT
BANDAGE ESMARK 6X9 LF (GAUZE/BANDAGES/DRESSINGS) ×2 IMPLANT
BLADE GREAT WHITE 4.2 (BLADE) ×3 IMPLANT
BLADE SAG 18X100X1.27 (BLADE) ×5 IMPLANT
BLADE SAW SGTL 13.0X1.19X90.0M (BLADE) ×3 IMPLANT
BLADE SURG 11 STRL SS (BLADE) ×2 IMPLANT
BLADE SURG ROTATE 9660 (MISCELLANEOUS) IMPLANT
BNDG CMPR 9X6 STRL LF SNTH (GAUZE/BANDAGES/DRESSINGS) ×2
BNDG ESMARK 6X9 LF (GAUZE/BANDAGES/DRESSINGS) ×3
BOOTCOVER CLEANROOM LRG (PROTECTIVE WEAR) ×6 IMPLANT
BOWL SMART MIX CTS (DISPOSABLE) ×3 IMPLANT
BUR SURG 4X8 MED (BURR) IMPLANT
BURR SURG 4X8 MED (BURR)
CEMENT BONE SIMPLEX SPEEDSET (Cement) ×6 IMPLANT
CLOTH BEACON ORANGE TIMEOUT ST (SAFETY) ×3 IMPLANT
COVER BACK TABLE 24X17X13 BIG (DRAPES) IMPLANT
COVER SURGICAL LIGHT HANDLE (MISCELLANEOUS) ×3 IMPLANT
CUFF TOURNIQUET SINGLE 34IN LL (TOURNIQUET CUFF) ×2 IMPLANT
CUTTER MENISCUS 3.5MM 6/BX (BLADE) ×1 IMPLANT
DEPRESSOR TONGUE BLADE STERILE (MISCELLANEOUS) ×1 IMPLANT
DRAPE ARTHROSCOPY W/POUCH 114 (DRAPES) ×1 IMPLANT
DRAPE EXTREMITY T 121X128X90 (DRAPE) ×3 IMPLANT
DRAPE PROXIMA HALF (DRAPES) ×3 IMPLANT
DRAPE U-SHAPE 47X51 STRL (DRAPES) ×3 IMPLANT
DRSG ADAPTIC 3X8 NADH LF (GAUZE/BANDAGES/DRESSINGS) ×2 IMPLANT
DRSG PAD ABDOMINAL 8X10 ST (GAUZE/BANDAGES/DRESSINGS) ×3 IMPLANT
DURAPREP 26ML APPLICATOR (WOUND CARE) ×3 IMPLANT
ELECT REM PT RETURN 9FT ADLT (ELECTROSURGICAL) ×3
ELECTRODE REM PT RTRN 9FT ADLT (ELECTROSURGICAL) ×2 IMPLANT
EVACUATOR 1/8 PVC DRAIN (DRAIN) ×3 IMPLANT
FACESHIELD LNG OPTICON STERILE (SAFETY) ×3 IMPLANT
GAUZE XEROFORM 1X8 LF (GAUZE/BANDAGES/DRESSINGS) IMPLANT
GAUZE XEROFORM 5X9 LF (GAUZE/BANDAGES/DRESSINGS) ×3 IMPLANT
GLOVE BIOGEL PI IND STRL 8 (GLOVE) ×2 IMPLANT
GLOVE BIOGEL PI INDICATOR 8 (GLOVE) ×1
GLOVE ORTHO TXT STRL SZ7.5 (GLOVE) ×6 IMPLANT
GOWN PREVENTION PLUS XLARGE (GOWN DISPOSABLE) ×6 IMPLANT
GOWN STRL NON-REIN LRG LVL3 (GOWN DISPOSABLE) ×6 IMPLANT
HANDPIECE INTERPULSE COAX TIP (DISPOSABLE) ×3
IMMOBILIZER KNEE 22 UNIV (SOFTGOODS) ×2 IMPLANT
KIT BASIN OR (CUSTOM PROCEDURE TRAY) ×3 IMPLANT
KIT ROOM TURNOVER OR (KITS) ×3 IMPLANT
MANIFOLD NEPTUNE II (INSTRUMENTS) ×3 IMPLANT
NDL 18GX1X1/2 (RX/OR ONLY) (NEEDLE) IMPLANT
NDL SPNL 18GX3.5 QUINCKE PK (NEEDLE) IMPLANT
NEEDLE 18GX1X1/2 (RX/OR ONLY) (NEEDLE) IMPLANT
NEEDLE 22X1 1/2 (OR ONLY) (NEEDLE) ×1 IMPLANT
NEEDLE SPNL 18GX3.5 QUINCKE PK (NEEDLE) IMPLANT
NS IRRIG 1000ML POUR BTL (IV SOLUTION) ×3 IMPLANT
PACK ARTHROSCOPY DSU (CUSTOM PROCEDURE TRAY) ×3 IMPLANT
PACK TOTAL JOINT (CUSTOM PROCEDURE TRAY) ×3 IMPLANT
PAD ARMBOARD 7.5X6 YLW CONV (MISCELLANEOUS) ×6 IMPLANT
PADDING CAST COTTON 6X4 STRL (CAST SUPPLIES) ×4 IMPLANT
RUBBERBAND STERILE (MISCELLANEOUS) ×3 IMPLANT
SET ARTHROSCOPY TUBING (MISCELLANEOUS) ×3
SET ARTHROSCOPY TUBING LN (MISCELLANEOUS) ×2 IMPLANT
SET HNDPC FAN SPRY TIP SCT (DISPOSABLE) ×2 IMPLANT
SPONGE GAUZE 4X4 12PLY (GAUZE/BANDAGES/DRESSINGS) ×3 IMPLANT
SPONGE LAP 4X18 X RAY DECT (DISPOSABLE) ×3 IMPLANT
STAPLER VISISTAT 35W (STAPLE) ×3 IMPLANT
SUCTION FRAZIER TIP 10 FR DISP (SUCTIONS) ×3 IMPLANT
SUT ETHILON 2 0 FS 18 (SUTURE) IMPLANT
SUT ETHILON 3 0 PS 1 (SUTURE) IMPLANT
SUT VIC AB 0 CT1 27 (SUTURE) ×3
SUT VIC AB 0 CT1 27XBRD ANBCTR (SUTURE) ×2 IMPLANT
SUT VIC AB 1 CTX 36 (SUTURE) ×3
SUT VIC AB 1 CTX36XBRD ANBCTR (SUTURE) ×2 IMPLANT
SUT VIC AB 2-0 SH 27 (SUTURE)
SUT VIC AB 2-0 SH 27XBRD (SUTURE) IMPLANT
SYR 20ML ECCENTRIC (SYRINGE) IMPLANT
SYR 30ML LL (SYRINGE) ×3 IMPLANT
SYR CONTROL 10ML LL (SYRINGE) IMPLANT
TOWEL OR 17X24 6PK STRL BLUE (TOWEL DISPOSABLE) ×3 IMPLANT
TOWEL OR 17X26 10 PK STRL BLUE (TOWEL DISPOSABLE) ×3 IMPLANT
TRAY FOLEY CATH 14FR (SET/KITS/TRAYS/PACK) ×3 IMPLANT
TUBE CONNECTING 12X1/4 (SUCTIONS) ×3 IMPLANT
WATER STERILE IRR 1000ML POUR (IV SOLUTION) ×5 IMPLANT

## 2012-04-15 NOTE — Preoperative (Signed)
Beta Blockers   Reason not to administer Beta Blockers:Not Applicable 

## 2012-04-15 NOTE — Transfer of Care (Signed)
Immediate Anesthesia Transfer of Care Note  Patient: [name]  Procedure(s) Performed: Procedure(s) (LRB) with comments: ARTHROSCOPY KNEE (Right) TOTAL KNEE ARTHROPLASTY (Right)  Patient Location: PACU  Anesthesia Type: General  Level of Consciousness: awake, alert  and oriented  Airway & Oxygen Therapy: Patient Spontanous Breathing and Patient connected to nasal cannula oxygen  Post-op Assessment: Report given to PACU RN and Post -op Vital signs reviewed and stable  Post vital signs: Reviewed  Complications: No apparent anesthesia complications

## 2012-04-15 NOTE — Brief Op Note (Signed)
04/15/2012  3:35 PM  PATIENT:  [name]  61 y.o. female  PRE-OPERATIVE DIAGNOSIS:  DJD RIGHT KNEE  POST-OPERATIVE DIAGNOSIS:  DJD RIGHT KNEE  PROCEDURE:  Procedure(s) (LRB) with comments: ARTHROSCOPY KNEE (Right) TOTAL KNEE ARTHROPLASTY (Right)  SURGEON:  Surgeon(s) and Role:    * Loreta Ave, MD - Primary  PHYSICIAN ASSISTANT: Zonia Kief M   ANESTHESIA:   regional and general  EBL:  Total I/O In: 1500 [I.V.:1500] Out: 175 [Urine:75; Blood:100]   SPECIMEN:  No Specimen  DISPOSITION OF SPECIMEN:  N/A  COUNTS:  YES  TOURNIQUET:   Total Tourniquet Time Documented: Thigh (Right) - 84 minutes  PATIENT DISPOSITION:  PACU - hemodynamically stable.

## 2012-04-15 NOTE — Progress Notes (Signed)
ANTICOAGULATION CONSULT NOTE - Initial Consult  Pharmacy Consult for Coumadin Indication: VTE prophylaxis  Allergies  Allergen Reactions  . Penicillins     REACTION: rash    Patient Measurements: Weight 104.4 kg (04/09/12) Height 170 cm (04/09/12)  Vital Signs: Temp: 97.7 F (36.5 C) (10/23 1515) Temp src: Oral (10/23 0808) BP: 118/62 mmHg (10/23 1521) Pulse Rate: 90  (10/23 1526)  Labs: No results found for this basename: HGB:2,HCT:3,PLT:3,APTT:3,LABPROT:3,INR:3,HEPARINUNFRC:3,CREATININE:3,CKTOTAL:3,CKMB:3,TROPONINI:3 in the last 72 hours  The CrCl is unknown because both a height and weight (above a minimum accepted value) are required for this calculation.   Medical History: Past Medical History  Diagnosis Date  . Allergy   . Hyperlipidemia   . IBS (irritable bowel syndrome)   . Lumbar disc disease   . Colon polyps   . Anxiety   . Asthma     /w resp. upset   . GERD (gastroesophageal reflux disease)   . Headache     migraines -h/o  . Arthritis     back & knee    Medications:  Prescriptions prior to admission  Medication Sig Dispense Refill  . azelastine (ASTELIN) 137 MCG/SPRAY nasal spray Place 1 spray into the nose 2 (two) times daily.       . calcium-vitamin D (OSCAL WITH D) 500-200 MG-UNIT per tablet Take 1 tablet by mouth daily.      . DULoxetine (CYMBALTA) 30 MG capsule Take 3 capsules (90 mg total) by mouth daily.  90 capsule  8  . estradiol (ESTRACE) 0.5 MG tablet Take 0.5 mg by mouth daily.      [name] Fexofenadine-Pseudoephedrine (ALLEGRA-D 24 HOUR PO) Take 1 tablet by mouth at bedtime.       . metoCLOPramide (REGLAN) 10 MG tablet Take 10 mg by mouth at bedtime.      . Multiple Vitamin (MULTIVITAMIN) capsule Take 1 capsule by mouth daily.      . nabumetone (RELAFEN) 750 MG tablet Take 750 mg by mouth 2 (two) times daily.      [name] NASONEX 50 MCG/ACT nasal spray Place 2 sprays into the nose daily.       [name] omeprazole (PRILOSEC) 20 MG capsule Take 20 mg by  mouth daily before breakfast.       . ondansetron (ZOFRAN) 4 MG tablet Take 4 mg by mouth every 4 (four) hours as needed. For nausea      . Probiotic Product (ALIGN) 4 MG CAPS Take 4 mg by mouth daily.      . psyllium (METAMUCIL SMOOTH TEXTURE) 28 % packet Take 1 packet by mouth 2 (two) times daily.       . rosuvastatin (CRESTOR) 20 MG tablet Take 20 mg by mouth. Take 1 tab twice weekly on Tuesday and Friday      . SUMAtriptan (IMITREX) 100 MG tablet Take 1 tablet (100 mg total) by mouth every 2 (two) hours as needed for migraine.  10 tablet  0    Assessment: 74 YOF s/p TKA on 04/15/12 to start Coumadin for VTE prophylaxis. Baseline INR is 0.97 on 04/09/12. CBC wnl. Hemodynamically stable post-op.   Goal of Therapy:  INR 2-3   Plan:  Warfarin 7.5mg  po x1 tonight. Daily PT/INR while on therapy. Coumadin book and video to initiate education.   Link Snuffer, PharmD, BCPS Clinical Pharmacist [phone] 04/15/2012,4:04 PM

## 2012-04-15 NOTE — Interval H&P Note (Signed)
History and Physical Interval Note:  04/15/2012 8:22 AM  [name]  has presented today for surgery, with the diagnosis of DJD RIGHT KNEE  The various methods of treatment have been discussed with the patient and family. After consideration of risks, benefits and other options for treatment, the patient has consented to  Procedure(s) (LRB) with comments: UNICOMPARTMENTAL KNEE (Right) ARTHROSCOPY KNEE (Right) as a surgical intervention .  The patient's history has been reviewed, patient examined, no change in status, stable for surgery.  I have reviewed the patient's chart and labs.  Questions were answered to the patient's satisfaction.     Eleana Tocco F

## 2012-04-15 NOTE — Anesthesia Preprocedure Evaluation (Addendum)
Anesthesia Evaluation  Patient identified by MRN, date of birth, ID band Patient awake    Reviewed: Allergy & Precautions, H&P , NPO status , Patient's Chart, lab work & pertinent test results  History of Anesthesia Complications Negative for: history of anesthetic complications  Airway Mallampati: II TM Distance: >3 FB Neck ROM: Full    Dental  (+) Teeth Intact and Dental Advisory Given   Pulmonary asthma ,  Patient reports mild asthma. Flares with bronchitis. Pt takes allergy shots and has not had bronchitis in recent memory. No inhaler use.         Cardiovascular Rhythm:Regular Rate:Normal     Neuro/Psych  Headaches,    GI/Hepatic GERD-  Medicated and Controlled,  Endo/Other    Renal/GU      Musculoskeletal   Abdominal   Peds  Hematology   Anesthesia Other Findings   Reproductive/Obstetrics                           Anesthesia Physical Anesthesia Plan  ASA: II  Anesthesia Plan: General and Regional   Post-op Pain Management:    Induction: Intravenous  Airway Management Planned: LMA  Additional Equipment:   Intra-op Plan:   Post-operative Plan: Extubation in OR  Informed Consent: I have reviewed the patients History and Physical, chart, labs and discussed the procedure including the risks, benefits and alternatives for the proposed anesthesia with the patient or authorized representative who has indicated his/her understanding and acceptance.   Dental advisory given  Plan Discussed with: CRNA  Anesthesia Plan Comments:         Anesthesia Quick Evaluation

## 2012-04-15 NOTE — Plan of Care (Signed)
Problem: Consults Goal: Diagnosis- Total Joint Replacement Primary Total Knee Right     

## 2012-04-15 NOTE — Anesthesia Procedure Notes (Addendum)
Anesthesia Regional Block:  Femoral nerve block  Pre-Anesthetic Checklist: ,, timeout performed, Correct Patient, Correct Site, Correct Laterality, Correct Procedure, Correct Position, site marked, Risks and benefits discussed,  Surgical consent,  Pre-op evaluation,  At surgeon's request and post-op pain management  Laterality: Right     Needles:  Injection technique: Single-shot  Needle Type: Echogenic Stimulator Needle      Needle Gauge: 22 and 22 G    Additional Needles:  Procedures: ultrasound guided and nerve stimulator Femoral nerve block Narrative:  Start time: 04/15/2012 11:50 AM End time: 04/15/2012 12:00 PM  Performed by: Personally   Additional Notes: 30 cc 0.5% marcaine with 1:200 Epi injected easily in 5 cc increments.  Kipp Brood, MD  Femoral nerve block Procedure Name: LMA Insertion Date/Time: 04/15/2012 12:05 PM Performed by: Lovie Chol Pre-anesthesia Checklist: Patient identified, Emergency Drugs available, Suction available, Patient being monitored and Timeout performed Patient Re-evaluated:Patient Re-evaluated prior to inductionOxygen Delivery Method: Circle system utilized Preoxygenation: Pre-oxygenation with 100% oxygen Intubation Type: IV induction Ventilation: Mask ventilation without difficulty LMA: LMA inserted LMA Size: 5.0 Number of attempts: 1 Placement Confirmation: breath sounds checked- equal and bilateral and positive ETCO2 Tube secured with: Tape Dental Injury: Teeth and Oropharynx as per pre-operative assessment

## 2012-04-15 NOTE — Anesthesia Postprocedure Evaluation (Signed)
  Anesthesia Post-op Note  Patient: [name]  Procedure(s) Performed: Procedure(s) (LRB) with comments: ARTHROSCOPY KNEE (Right) TOTAL KNEE ARTHROPLASTY (Right)  Patient Location: PACU  Anesthesia Type: GA combined with regional for post-op pain  Level of Consciousness: awake and alert   Airway and Oxygen Therapy: Patient Spontanous Breathing and Patient connected to nasal cannula oxygen  Post-op Pain: none  Post-op Assessment: Post-op Vital signs reviewed, Patient's Cardiovascular Status Stable, Respiratory Function Stable, Patent Airway and No signs of Nausea or vomiting  Post-op Vital Signs: Reviewed and stable  Complications: No apparent anesthesia complications

## 2012-04-15 NOTE — Progress Notes (Signed)
Orthopedic Tech Progress Note Patient Details:  [name] 10-May-1951 [id number] No overhead frame available at this time. CPM Right Knee CPM Right Knee: On Right Knee Flexion (Degrees): 60  Right Knee Extension (Degrees): 0    [name] 04/15/2012, 3:03 PM

## 2012-04-16 ENCOUNTER — Encounter (HOSPITAL_COMMUNITY): Payer: Self-pay | Admitting: General Practice

## 2012-04-16 LAB — CBC
HCT: 36.9 % (ref 36.0–46.0)
Hemoglobin: 11.8 g/dL — ABNORMAL LOW (ref 12.0–15.0)
MCH: 30 pg (ref 26.0–34.0)
MCHC: 32 g/dL (ref 30.0–36.0)
MCV: 93.9 fL (ref 78.0–100.0)
Platelets: 279 10*3/uL (ref 150–400)
RBC: 3.93 MIL/uL (ref 3.87–5.11)
RDW: 12.2 % (ref 11.5–15.5)
WBC: 9.8 10*3/uL (ref 4.0–10.5)

## 2012-04-16 LAB — PROTIME-INR
INR: 1.08 (ref 0.00–1.49)
Prothrombin Time: 13.9 seconds (ref 11.6–15.2)

## 2012-04-16 LAB — BASIC METABOLIC PANEL
BUN: 10 mg/dL (ref 6–23)
CO2: 26 mEq/L (ref 19–32)
Calcium: 8.2 mg/dL — ABNORMAL LOW (ref 8.4–10.5)
Chloride: 103 mEq/L (ref 96–112)
Creatinine, Ser: 0.54 mg/dL (ref 0.50–1.10)
GFR calc Af Amer: >90 mL/min (ref >90)
GFR calc non Af Amer: >90 mL/min (ref >90)
Glucose, Bld: 142 mg/dL — ABNORMAL HIGH (ref 70–99)
Potassium: 4.1 mEq/L (ref 3.5–5.1)
Sodium: 137 mEq/L (ref 135–145)

## 2012-04-16 MED ORDER — WARFARIN SODIUM 7.5 MG PO TABS
7.5000 mg | ORAL_TABLET | Freq: Once | ORAL | Status: AC
  Administered 2012-04-16: 7.5 mg via ORAL
  Filled 2012-04-16: qty 1

## 2012-04-16 NOTE — Op Note (Signed)
NAMECHARLESA, EHLE NO.:  [id number]  MEDICAL RECORD NO.:  [id number]  LOCATION:  [id number]                        FACILITY:  [facility]  PHYSICIAN:  Loreta Ave, M.D. DATE OF BIRTH:  April 13, 1951  DATE OF PROCEDURE:  04/15/2012 DATE OF DISCHARGE:                              OPERATIVE REPORT   PREOPERATIVE DIAGNOSIS:  Right knee advanced grade 4 degenerative arthritis, patellofemoral joint.  POSTOPERATIVE DIAGNOSIS:  Right knee grade 4 changes patellofemoral joint, but unfortunately with associated focal grade 4 changes medial femoral condyle and lateral tibial plateau with chondral fragmentation throughout.  Persistent lateral tracking patellofemoral joint after previous open lateral release.  PROCEDURE:  Right knee exam under anesthesia.  Diagnostic arthroscopy to assess the entirety of the knee  followed by an open minimally invasive total knee replacement, Stryker Triathlon prosthesis.  Soft tissue balancing.  Cemented pegged posterior stabilized #3 femoral component. Cemented #4 tibial component, 9-mm polyethylene insert.  Cemented pegged medial offset resurfacing 32-mm patellar component.  SURGEON:  Loreta Ave, MD  ASSISTANT:  Genene Churn. Barry Dienes, [place], present throughout the entire case and necessary for timely completion of procedure.  ANESTHESIA:  General.  BLOOD LOSS:  Minimal.  SPECIMENS:  None.  CULTURES:  None.  COMPLICATION:  None.  DRESSING:  Soft compressive, knee immobilizer.  DRAINS:  Hemovac x1.  TOURNIQUET TIME:  One hour.  DESCRIPTION OF PROCEDURE:  The patient was brought to the operating room, placed on the operating table in supine position.  After adequate anesthesia had been obtained, the right knee was examined.  Fairly good extension and flexion.  She has had a lateral release and she still tended to extend laterally with lateral tracking not extreme. Tourniquet applied.  Prepped and draped in usual sterile  fashion. Exsanguinated with elevation of Esmarch.  Tourniquet inflated to 350 mmHg.  Incision made above the patella down to the tibial tubercle. Utilizing the bottom of the incision, medial and lateral parapatellar portals were made.  Arthroscope reduced.  The entire knee assessed.  A marked grade 4 changes patellofemoral joint with no cartilage eroded and bony erosions on both sides of the joint.  Fairly good lateral release. Lateral tethering.  Cruciate ligaments intact.  Medial compartment had a focal grade 4 lesion on the right in the middle of the weightbearing dome almost 2 cm in diameter full-thickness.  Some degenerative changes in anterior wall of the medial meniscus.  Laterally, grade 3 and 4 changes lateral tibial plateau.  Once I determined that there were really tricompartmental changes and not specific patellofemoral, all attempts of unicompartmental were abandoned and we proceeded with total knee replacement.  Instruments and fluid were removed.  Remaining anterior incision opened.  Hemostasis with cautery.  Medial arthrotomy, vastus splitting preserving quad tendon.  Knee exposed.  Confirmation marked grade 4 changes.  Remnants of menisci, cruciate ligaments, periarticular spurs removed.  A little bit of a band in the bottom of previous lateral release was cut to complete that.  Distal femur exposed.  Intramedullary guide placed.  8-mm of resection, 5 degrees of valgus.  Using epicondylar axis, the femur was sized, cut, and fitted for posterior stabilized #3 pegged component.  A lot of bony  erosion lateral trochlea.  Proximal tibial resection, 3-degree posterior slope cut.  Size 4 component.  Debris cleared throughout the knee in all recesses.  Medial capsular release had already been done.  Knee was balanced in flexion and extension.  Patella exposed.  Exuberant spurs removed.  Taken down to a thickness little bit more 10 mm at the base of the defect and then eroded down  laterally.  Drilled, sized, and fitted for a 32 mm component.  Trials put in place.  A #3 on the femur, #4 on the tibia, 9-mm insert and 32 patella.  With this construct, nicely balanced knee.  Full extension, full flexion, good patellar tracking. Nice biomechanical axis.  Tibia was marked for rotation and reamed.  All trials removed.  Copious irrigation with pulse irrigating device. Cement prepared, placed on all components, firmly seated.  Polyethylene attached to tibia, knee reduced.  Patella held with a clamp.  Once cement hardened, the knee was reexamined.  Very pleased with alignment, stability, biomechanical axis.  Hemovac was placed and brought out through a separate stab wound.  Arthrotomy closed with #1 Vicryl, skin and subcutaneous tissue with Vicryl and staples.  Sterile compressive dressing applied.  Tourniquet deflated and removed.  Knee immobilizer applied.  Anesthesia reversed.  Brought to the recovery room.  Tolerated surgery well.  No complications.     Loreta Ave, M.D.     DFM/MEDQ  D:  04/15/2012  T:  04/16/2012  Job:  [id number]

## 2012-04-16 NOTE — Evaluation (Signed)
Physical Therapy Evaluation Patient Details Name: [name] MRN: [id number] DOB: 04-Apr-1951 Today's Date: 04/16/2012 Time: 0865-7846 PT Time Calculation (min): 41 min  PT Assessment / Plan / Recommendation Clinical Impression  Pt is a 61 y/o female s/p R TKA. Pt will benefit from acute PT intervention to maximize functional mobility in preparation for d/c to home with HHPT.     PT Assessment  Patient needs continued PT services    Follow Up Recommendations  Home health PT;Supervision - Intermittent    Does the patient have the potential to tolerate intense rehabilitation      Barriers to Discharge None      Equipment Recommendations  None recommended by PT    Recommendations for Other Services     Frequency 7X/week    Precautions / Restrictions Precautions Precautions: Knee Precaution Booklet Issued: No Required Braces or Orthoses: Knee Immobilizer - Right Knee Immobilizer - Right: On except when in CPM Restrictions Weight Bearing Restrictions: Yes RLE Weight Bearing: Weight bearing as tolerated   Pertinent Vitals/Pain Pt reporting pain in knee 5/10.  Pt medicated by RN.       Mobility  Bed Mobility Bed Mobility: Supine to Sit;Sitting - Scoot to [place] of Bed;Sit to Supine;Scooting to [facility] Supine to Sit: 5: Supervision Sitting - Scoot to Edge of Bed: 5: Supervision Sit to Supine: 5: Supervision Scooting to [facility]: 5: Supervision;With rail Details for Bed Mobility Assistance: Verbal cues for technique. Transfers Transfers: Sit to Stand;Stand to Sit Sit to Stand: 5: Supervision Stand to Sit: 5: Supervision Details for Transfer Assistance: Cues for hand placement .   Ambulation/Gait Ambulation/Gait Assistance: 4: Min guard Ambulation Distance (Feet): 50 Feet Assistive device: Rolling walker Ambulation/Gait Assistance Details: Cues for sequencing.  Gait Pattern: Step-to pattern Stairs: No Wheelchair Mobility Wheelchair Mobility: No    Shoulder  Instructions     Exercises Total Joint Exercises Ankle Circles/Pumps: AROM;Both;10 reps Quad Sets: Right;10 reps;Supine Goniometric ROM: 4-72 degrees AAROM R Knee   PT Diagnosis: Difficulty walking;Generalized weakness;Acute pain  PT Problem List: Decreased strength;Decreased range of motion;Decreased activity tolerance;Decreased mobility;Decreased knowledge of precautions;Decreased knowledge of use of DME;Pain;Obesity PT Treatment Interventions: DME instruction;Gait training;Stair training;Functional mobility training;Therapeutic activities;Therapeutic exercise;Patient/family education   PT Goals Acute Rehab PT Goals PT Goal Formulation: With patient Time For Goal Achievement: 04/23/12 Potential to Achieve Goals: Good Pt will go Supine/Side to Sit: with modified independence PT Goal: Supine/Side to Sit - Progress: Goal set today Pt will go Sit to Supine/Side: with modified independence PT Goal: Sit to Supine/Side - Progress: Goal set today Pt will Transfer Bed to Chair/Chair to Bed: with modified independence PT Transfer Goal: Bed to Chair/Chair to Bed - Progress: Goal set today Pt will Ambulate: >150 feet;with modified independence;with rolling walker PT Goal: Ambulate - Progress: Goal set today Pt will Go Up / Down Stairs: 3-5 stairs;with supervision;with rolling walker PT Goal: Up/Down Stairs - Progress: Goal set today Pt will Perform Home Exercise Program: with supervision, verbal cues required/provided PT Goal: Perform Home Exercise Program - Progress: Goal set today  Visit Information  Last PT Received On: 04/16/12 Assistance Needed: +1    Subjective Data  Subjective: agree to PT eva. Patient Stated Goal: return to home   Prior Functioning  Home Living Lives With: Spouse Available Help at Discharge: Family;Available 24 hours/day Type of Home: House Home Access: Stairs to enter [employer] of Steps: 3 Entrance Stairs-Rails: Right;Left Home Layout: One  level Bathroom Shower/Tub: Health visitor: Standard Bathroom  Accessibility: Yes How Accessible: Accessible via walker Home Adaptive Equipment: Walker - rolling;Bedside commode/3-in-1 Prior Function Level of Independence: Independent Able to Take Stairs?: Yes Driving: Yes Vocation: Retired Musician: No difficulties Dominant Hand: Right    Cognition  Overall Cognitive Status: Appears within functional limits for tasks assessed/performed Arousal/Alertness: Awake/alert Orientation Level: Appears intact for tasks assessed Behavior During Session: [facility] for tasks performed    Extremity/Trunk Assessment Right Upper Extremity Assessment RUE ROM/Strength/Tone: [facility] for tasks assessed Left Upper Extremity Assessment LUE ROM/Strength/Tone: WFL for tasks assessed Right Lower Extremity Assessment RLE ROM/Strength/Tone: Unable to fully assess Left Lower Extremity Assessment LLE ROM/Strength/Tone: Within functional levels Trunk Assessment Trunk Assessment: Normal   Balance    End of Session PT - End of Session Equipment Utilized During Treatment: Gait belt;Right knee immobilizer Activity Tolerance: Patient tolerated treatment well Patient left: in chair;with call bell/phone within reach Nurse Communication: Mobility status CPM Right Knee CPM Right Knee: Off  GP     Michael Walrath 04/16/2012, 5:56 PM  Jenesis Martin L. Burton Gahan DPT [phone]

## 2012-04-16 NOTE — Evaluation (Signed)
Occupational Therapy Evaluation Patient Details Name: [name] MRN: [id number] DOB: [birth year] Today's Date: 04/16/2012 Time: 4098-1191 OT Time Calculation (min): 25 min  OT Assessment / Plan / Recommendation Clinical Impression  61 yo female s/p right TKA. Pt is progressing well, will address LB bathing and dressing in next sesssion. OT to follow acutely.     OT Assessment  Patient needs continued OT Services    Follow Up Recommendations  No OT follow up    Barriers to Discharge      Equipment Recommendations  None recommended by OT    Recommendations for Other Services    Frequency  Min 2X/week    Precautions / Restrictions Restrictions Weight Bearing Restrictions: Yes RLE Weight Bearing: Weight bearing as tolerated   Pertinent Vitals/Pain No pain    ADL  Eating/Feeding: Performed;Independent Where Assessed - Eating/Feeding: Bed level Grooming: Performed;Wash/dry hands;Supervision/safety Where Assessed - Grooming: Unsupported standing Toilet Transfer: Performed;Min guard Statistician Method: Sit to Barista: Raised toilet seat with arms (or 3-in-1 over toilet) Toileting - Clothing Manipulation and Hygiene: Performed;Independent Where Assessed - Toileting Clothing Manipulation and Hygiene: Standing Tub/Shower Transfer: Landscape architect Method: Science writer: Walk in shower Transfers/Ambulation Related to ADLs: Guarding just for safety  ADL Comments: Pt. performed bed mobility at supervision level with min vc's for sequencing. Ambulated to bathroom with min guard for safety. Performed toilet transfer with min guard and washed hands with supervision standing at sink level. Simulated walk in shower with supervision and cues for technique and safety.      OT Diagnosis: Generalized weakness;Acute pain  OT Problem List: Decreased activity tolerance;Decreased knowledge of use of  DME or AE;Decreased knowledge of precautions;Pain OT Treatment Interventions: Self-care/ADL training;Therapeutic exercise;DME and/or AE instruction;Patient/family education;Therapeutic activities   OT Goals Acute Rehab OT Goals OT Goal Formulation: With patient ADL Goals Pt Will Perform Lower Body Bathing: with modified independence;Sit to stand from chair;with adaptive equipment ADL Goal: Lower Body Bathing - Progress: Goal set today Pt Will Perform Lower Body Dressing: with modified independence;Sit to stand from chair;Sit to stand from bed;with adaptive equipment ADL Goal: Lower Body Dressing - Progress: Goal set today  Visit Information  Last OT Received On: 04/16/12 Assistance Needed: +1    Subjective Data  Subjective: I just got back into bed and was hoping I was done for the day Patient Stated Goal: To return home   Prior Functioning     Home Living Lives With: Spouse Available Help at Discharge: Family;Available 24 hours/day Type of Home: House Home Access: Stairs to enter [employer] of Steps: 3 Entrance Stairs-Rails: Right;Left Home Layout: One level Bathroom Shower/Tub: Health visitor: Standard Bathroom Accessibility: Yes How Accessible: Accessible via walker Home Adaptive Equipment: Walker - rolling;Bedside commode/3-in-1 Prior Function Level of Independence: Independent Able to Take Stairs?: Yes Driving: Yes Vocation: Retired Musician: No difficulties Dominant Hand: Right         Vision/Perception     Cognition  Overall Cognitive Status: Appears within functional limits for tasks assessed/performed Arousal/Alertness: Awake/alert Orientation Level: Appears intact for tasks assessed Behavior During Session: [facility] for tasks performed    Extremity/Trunk Assessment Right Upper Extremity Assessment RUE ROM/Strength/Tone: [facility] for tasks assessed Left Upper Extremity Assessment LUE ROM/Strength/Tone: WFL for  tasks assessed Right Lower Extremity Assessment RLE ROM/Strength/Tone: Unable to fully assess Left Lower Extremity Assessment LLE ROM/Strength/Tone: Within functional levels Trunk Assessment Trunk Assessment: Normal     Mobility Bed Mobility Bed Mobility:  Supine to Sit;Sitting - Scoot to Edge of Bed;Sit to Supine;Scooting to [facility] Supine to Sit: 5: Supervision;HOB flat;With rails Sitting - Scoot to Edge of Bed: 5: Supervision Sit to Supine: 5: Supervision;HOB flat Scooting to HOB: 5: Supervision;With rail Details for Bed Mobility Assistance: Supervision with vc's for technique, did not require any physical (A). Instructed on using LLE to support RLE to bring up onto bed.  Transfers Transfers: Sit to Stand;Stand to Sit Sit to Stand: 4: Min guard;With upper extremity assist;From bed;From chair/3-in-1 Stand to Sit: 4: Min guard;With upper extremity assist;To bed;To chair/3-in-1 Details for Transfer Assistance: guarding for safety                     End of Session OT - End of Session Equipment Utilized During Treatment: Gait belt;Right knee immobilizer Activity Tolerance: Patient tolerated treatment well Patient left: in bed;with call bell/phone within reach Nurse Communication: Mobility status  GO     [name] 04/16/2012, 3:29 PM

## 2012-04-16 NOTE — Progress Notes (Signed)
ANTICOAGULATION CONSULT NOTE - Follow-Consult  Pharmacy Consult for Coumadin Indication: VTE prophylaxis  Allergies  Allergen Reactions  . Penicillins     REACTION: rash    Patient Measurements: Weight 104.4 kg (04/09/12) Height 170 cm (04/09/12)  Vital Signs: Temp: 99 F (37.2 C) (10/24 1610) BP: 136/58 mmHg (10/24 0638) Pulse Rate: 98  (10/24 0638)  Labs:  Basename 04/16/12 0605  HGB Hayley.8*  HCT 36.9  PLT 279  APTT --  LABPROT 13.9  INR 1.08  HEPARINUNFRC --  CREATININE 0.54  CKTOTAL --  CKMB --  TROPONINI --    The CrCl is unknown because both a height and weight (above a minimum accepted value) are required for this calculation.   Medical History: Past Medical History  Diagnosis Date  . Allergy   . Hyperlipidemia   . IBS (irritable bowel syndrome)   . Lumbar disc disease   . Colon polyps   . Anxiety   . Asthma     /w resp. upset   . GERD (gastroesophageal reflux disease)   . Headache     migraines -h/o  . Arthritis     back & knee    Medications:  Prescriptions prior to admission  Medication Sig Dispense Refill  . azelastine (ASTELIN) 137 MCG/SPRAY nasal spray Place 1 spray into the nose 2 (two) times daily.       . calcium-vitamin D (OSCAL WITH D) 500-200 MG-UNIT per tablet Take 1 tablet by mouth daily.      . DULoxetine (CYMBALTA) 30 MG capsule Take 3 capsules (90 mg total) by mouth daily.  90 capsule  8  . estradiol (ESTRACE) 0.5 MG tablet Take 0.5 mg by mouth daily.      [name] Fexofenadine-Pseudoephedrine (ALLEGRA-D 24 HOUR PO) Take 1 tablet by mouth at bedtime.       . metoCLOPramide (REGLAN) 10 MG tablet Take 10 mg by mouth at bedtime.      . Multiple Vitamin (MULTIVITAMIN) capsule Take 1 capsule by mouth daily.      . nabumetone (RELAFEN) 750 MG tablet Take 750 mg by mouth 2 (two) times daily.      [name] NASONEX 50 MCG/ACT nasal spray Place 2 sprays into the nose daily.       [name] omeprazole (PRILOSEC) 20 MG capsule Take 20 mg by mouth daily before  breakfast.       . ondansetron (ZOFRAN) 4 MG tablet Take 4 mg by mouth every 4 (four) hours as needed. For nausea      . Probiotic Product (ALIGN) 4 MG CAPS Take 4 mg by mouth daily.      . psyllium (METAMUCIL SMOOTH TEXTURE) 28 % packet Take 1 packet by mouth 2 (two) times daily.       . rosuvastatin (CRESTOR) 20 MG tablet Take 20 mg by mouth. Take 1 tab twice weekly on Tuesday and Friday      . SUMAtriptan (IMITREX) 100 MG tablet Take 1 tablet (100 mg total) by mouth every 2 (two) hours as needed for migraine.  10 tablet  0    Assessment: [name] s/p TKA on 04/15/12 on Coumadin for VTE prophylaxis; also noted on Lovenox 30 mg Q12.  H/H drop post-op; platelets wnl.  Day 2 of coumadin; INR today 1.08.  Goal of Therapy:  INR 2-3 Platelet monitoring per protocol? Yes   Plan:  Warfarin 7.5 mg po x1 Daily INR.   Bernadene Person PharmD Candidate 04/16/2012 9:50 AM  I have reviewed the  above and agree with the plan.  Harland German, Pharm D 04/16/2012 Hayley:01 AM

## 2012-04-16 NOTE — Progress Notes (Signed)
CARE MANAGEMENT NOTE 04/16/2012  Patient:  BRYLINN, TEANEY   Account Number:  [id number]  Date Initiated:  04/16/2012  Documentation initiated by:  Vance Peper  Subjective/Objective Assessment:   61 yr old female s/p right total knee arthroplasty.     Action/Plan:   CM Spoke with patient concerning home health and DME needs. Choice offered. Patient preoperatively setup with [facility], no changes. RW has been delivered to hosp, 3in1 and CPM to be delivered to the home.   Anticipated DC Date:  04/17/2012   Anticipated DC Plan:  HOME W HOME HEALTH SERVICES      DC Planning Services  CM consult      [facility] Choice  HOME HEALTH   Choice offered to / List presented to:  C-1 Patient        HH arranged  HH-2 PT      [facility] agency  [facility]   Status of service:  Completed, signed off Medicare Important Message given?   (If response is "NO", the following Medicare IM given date fields will be blank) Date Medicare IM given:   Date Additional Medicare IM given:    Discharge Disposition:  HOME W HOME HEALTH SERVICES  Per UR Regulation:    If discussed at Long Length of Stay Meetings, dates discussed:    Comments:

## 2012-04-16 NOTE — Progress Notes (Signed)
Subjective: Doing well.  Pain controlled.     Objective: Vital signs in last 24 hours: Temp:  [97.3 F (36.3 C)-99 F (37.2 C)] 99 F (37.2 C) (10/24 2956) Pulse Rate:  [85-100] 98  (10/24 0638) Resp:  [10-22] 18  (10/24 0638) BP: (111-155)/(51-79) 136/58 mmHg (10/24 0638) SpO2:  [92 %-100 %] 96 % (10/24 2130)  Intake/Output from previous day: 10/23 0701 - 10/24 0700 In: 2856.6 [P.O.:240; I.V.:2416.6; IV Piggyback:200] Out: 1250 [Urine:875; Drains:275; Blood:100] Intake/Output this shift:     Basename 04/16/12 0605  HGB 11.8*    Basename 04/16/12 0605  WBC 9.8  RBC 3.93  HCT 36.9  PLT 279    Basename 04/16/12 0605  NA 137  K 4.1  CL 103  CO2 26  BUN 10  CREATININE 0.54  GLUCOSE 142*  CALCIUM 8.2*    Basename 04/16/12 0605  LABPT --  INR 1.08    Exam:  Dressing c/d/i.  Calf nt, nvi.    Assessment/Plan: D/c dilaudid.  Start PT.  Anticipate d/c home Friday or Saturday.     [name] M 04/16/2012, 10:31 AM

## 2012-04-16 NOTE — Progress Notes (Signed)
Physical Therapy Treatment Patient Details Name: [name] MRN: [id number] DOB: 09/19/1950 Today's Date: 04/16/2012 Time: 4098-1191 PT Time Calculation (min): 37 min  PT Assessment / Plan / Recommendation Comments on Treatment Session  Applied ice pack for pain at end of session. Pt doing very well should d/c home tomorrow.     Follow Up Recommendations  Home health PT;Supervision - Intermittent     Does the patient have the potential to tolerate intense rehabilitation     Barriers to Discharge None      Equipment Recommendations  None recommended by PT    Recommendations for Other Services    Frequency 7X/week   Plan Discharge plan remains appropriate;Frequency remains appropriate    Precautions / Restrictions Precautions Precautions: Knee Precaution Booklet Issued: No Required Braces or Orthoses: Knee Immobilizer - Right Knee Immobilizer - Right: On except when in CPM Restrictions Weight Bearing Restrictions: Yes RLE Weight Bearing: Weight bearing as tolerated   Pertinent Vitals/Pain Pt with no pain at rest, 6/10 pain in R knee with activity.  Pre-medicated, repositioned and applied ice.      Mobility  Bed Mobility Bed Mobility: Supine to Sit;Sitting - Scoot to [place] of Bed;Sit to Supine;Scooting to [facility] Supine to Sit: 6: Modified independent (Device/Increase time) Sitting - Scoot to Edge of Bed: 6: Modified independent (Device/Increase time) Sit to Supine: 6: Modified independent (Device/Increase time) Details for Bed Mobility Assistance: Verbal cues for technique. Transfers Transfers: Sit to Stand;Stand to Sit Sit to Stand: 5: Supervision Stand to Sit: 5: Supervision Details for Transfer Assistance: Supervision for safety.  Ambulation/Gait Ambulation/Gait Assistance: 5: Supervision Ambulation Distance (Feet): 80 Feet Assistive device: Rolling walker Ambulation/Gait Assistance Details: Cues for sequencing.  Gait Pattern: Step-to pattern Stairs:  Yes Stairs Assistance: 5: Supervision Stairs Assistance Details (indicate cue type and reason): Instructed pt and significant other on stair negotiation with RW and rail, and 1 rail with bilateral UEs.  Stair Management Technique: One rail Right;Step to pattern;Forwards;With walker Number of Stairs: 3  (3 trials. ) Wheelchair Mobility Wheelchair Mobility: No    Exercises Total Joint Exercises Ankle Circles/Pumps: AROM;Both;10 reps Quad Sets: Right;10 reps;Supine Goniometric ROM: 4-72 degrees AAROM R Knee   PT Diagnosis: Difficulty walking;Generalized weakness;Acute pain  PT Problem List: Decreased strength;Decreased range of motion;Decreased activity tolerance;Decreased mobility;Decreased knowledge of precautions;Decreased knowledge of use of DME;Pain;Obesity PT Treatment Interventions: DME instruction;Gait training;Stair training;Functional mobility training;Therapeutic activities;Therapeutic exercise;Patient/family education   PT Goals Acute Rehab PT Goals PT Goal Formulation: With patient Time For Goal Achievement: 04/23/12 Potential to Achieve Goals: Good Pt will go Supine/Side to Sit: with modified independence PT Goal: Supine/Side to Sit - Progress: Met Pt will go Sit to Supine/Side: with modified independence PT Goal: Sit to Supine/Side - Progress: Met Pt will Transfer Bed to Chair/Chair to Bed: with modified independence PT Transfer Goal: Bed to Chair/Chair to Bed - Progress: Progressing toward goal Pt will Ambulate: >150 feet;with modified independence;with rolling walker PT Goal: Ambulate - Progress: Progressing toward goal Pt will Go Up / Down Stairs: 3-5 stairs;with supervision;with rolling walker PT Goal: Up/Down Stairs - Progress: Met Pt will Perform Home Exercise Program: with supervision, verbal cues required/provided PT Goal: Perform Home Exercise Program - Progress: Progressing toward goal  Visit Information  Last PT Received On: 04/16/12 Assistance Needed: +1     Subjective Data  Subjective: agree to PT eva. Patient Stated Goal: return to home   Cognition  Overall Cognitive Status: Appears within functional limits for tasks assessed/performed Arousal/Alertness: Awake/alert  Orientation Level: Appears intact for tasks assessed Behavior During Session: [facility] for tasks performed    Balance     End of Session PT - End of Session Equipment Utilized During Treatment: Gait belt;Right knee immobilizer Activity Tolerance: Patient tolerated treatment well Patient left: in chair;with call bell/phone within reach Nurse Communication: Mobility status CPM Right Knee CPM Right Knee: Off   GP     Kura Bethards 04/16/2012, 6:54 PM Sigfredo Schreier L. Taneil Lazarus DPT [phone]

## 2012-04-16 NOTE — Evaluation (Signed)
I agree with the following treatment note after reviewing documentation.   Johnston, Toneisha Savary Brynn   OTR/L Pager: [phone] Office: [phone] .   

## 2012-04-17 LAB — CBC
HCT: 34.6 % — ABNORMAL LOW (ref 36.0–46.0)
Hemoglobin: 11.8 g/dL — ABNORMAL LOW (ref 12.0–15.0)
MCH: 30.8 pg (ref 26.0–34.0)
MCHC: 34.1 g/dL (ref 30.0–36.0)
MCV: 90.3 fL (ref 78.0–100.0)
Platelets: ADEQUATE 10*3/uL (ref 150–400)
RBC: 3.83 MIL/uL — ABNORMAL LOW (ref 3.87–5.11)
RDW: 12.3 % (ref 11.5–15.5)
WBC: 12.4 10*3/uL — ABNORMAL HIGH (ref 4.0–10.5)

## 2012-04-17 LAB — BASIC METABOLIC PANEL
BUN: 9 mg/dL (ref 6–23)
CO2: 20 mEq/L (ref 19–32)
Calcium: 8.3 mg/dL — ABNORMAL LOW (ref 8.4–10.5)
Chloride: 99 mEq/L (ref 96–112)
Creatinine, Ser: 0.54 mg/dL (ref 0.50–1.10)
GFR calc Af Amer: >90 mL/min (ref >90)
GFR calc non Af Amer: >90 mL/min (ref >90)
Glucose, Bld: 132 mg/dL — ABNORMAL HIGH (ref 70–99)
Potassium: 3.5 mEq/L (ref 3.5–5.1)
Sodium: 134 mEq/L — ABNORMAL LOW (ref 135–145)

## 2012-04-17 LAB — PROTIME-INR
INR: 1.4 (ref 0.00–1.49)
Prothrombin Time: 16.8 seconds — ABNORMAL HIGH (ref 11.6–15.2)

## 2012-04-17 MED ORDER — ENOXAPARIN SODIUM 30 MG/0.3ML ~~LOC~~ SOLN: 30.0000 mg | Freq: Two times a day (BID) | SUBCUTANEOUS | Status: DC

## 2012-04-17 MED ORDER — WARFARIN SODIUM 7.5 MG PO TABS
7.5000 mg | ORAL_TABLET | Freq: Once | ORAL | Status: AC
  Administered 2012-04-17: 7.5 mg via ORAL
  Filled 2012-04-17: qty 1

## 2012-04-17 MED ORDER — MAGNESIUM CITRATE PO SOLN
1.0000 | Freq: Once | ORAL | Status: AC
  Administered 2012-04-17: 1 via ORAL
  Filled 2012-04-17: qty 296

## 2012-04-17 MED ORDER — METHOCARBAMOL 500 MG PO TABS: 500.0000 mg | ORAL_TABLET | Freq: Four times a day (QID) | ORAL | Status: DC | PRN

## 2012-04-17 MED ORDER — HYDROMORPHONE HCL PF 1 MG/ML IJ SOLN: 1.0000 mg | INTRAMUSCULAR | Status: DC | PRN

## 2012-04-17 MED ORDER — WARFARIN SODIUM 5 MG PO TABS: 5.0000 mg | ORAL_TABLET | Freq: Every day | ORAL | Status: DC

## 2012-04-17 MED ORDER — OXYCODONE-ACETAMINOPHEN 7.5-325 MG PO TABS: 1.0000 | ORAL_TABLET | ORAL | Status: DC | PRN

## 2012-04-17 NOTE — Progress Notes (Signed)
Physical Therapy Treatment Patient Details Name: [name] MRN: [id number] DOB: Feb 18, 1951 Today's Date: 04/17/2012 Time: 4098-1191 PT Time Calculation (min): 24 min  PT Assessment / Plan / Recommendation Comments on Treatment Session       Follow Up Recommendations  Home health PT;Supervision - Intermittent     Does the patient have the potential to tolerate intense rehabilitation     Barriers to Discharge        Equipment Recommendations  None recommended by PT    Recommendations for Other Services    Frequency 7X/week   Plan Discharge plan remains appropriate;Frequency remains appropriate    Precautions / Restrictions Precautions Precautions: Knee Precaution Booklet Issued: No Required Braces or Orthoses: Knee Immobilizer - Right Knee Immobilizer - Right: On except when in CPM Restrictions Weight Bearing Restrictions: Yes RLE Weight Bearing: Weight bearing as tolerated   Pertinent Vitals/Pain 2-5/10 Pain in knee.  RN notified of pt desire for pain medication.     Mobility  Bed Mobility Bed Mobility: Supine to Sit;Sit to Supine;Sitting - Scoot to Edge of Bed Supine to Sit: 6: Modified independent (Device/Increase time) Sitting - Scoot to Edge of Bed: 6: Modified independent (Device/Increase time) Sit to Supine: 6: Modified independent (Device/Increase time) Transfers Transfers: Not assessed Sit to Stand: From bed;With upper extremity assist;5: Supervision Stand to Sit: To chair/3-in-1;With upper extremity assist;5: Supervision Details for Transfer Assistance: Supervision only due to pt stating she felt loopy from pain meds.  Ambulation/Gait Ambulation/Gait Assistance: Not tested (comment)    Exercises Total Joint Exercises Ankle Circles/Pumps: AROM;Both;10 reps Quad Sets: Right;10 reps;Supine Short Arc Quad: Right;10 reps;Supine Heel Slides: Right;10 reps;Supine Hip ABduction/ADduction: 10 reps;Right;Supine Straight Leg Raises: 10  reps;Right;Supine;AAROM Goniometric ROM: 0-78   PT Diagnosis:    PT Problem List:   PT Treatment Interventions:     PT Goals Acute Rehab PT Goals PT Goal Formulation: With patient Time For Goal Achievement: 04/23/12 Potential to Achieve Goals: Good Pt will go Supine/Side to Sit: with modified independence PT Goal: Supine/Side to Sit - Progress: Met Pt will go Sit to Supine/Side: with modified independence PT Goal: Sit to Supine/Side - Progress: Met Pt will Transfer Bed to Chair/Chair to Bed: with modified independence PT Transfer Goal: Bed to Chair/Chair to Bed - Progress: Met Pt will Ambulate: >150 feet;with modified independence;with rolling walker PT Goal: Ambulate - Progress: Met Pt will Go Up / Down Stairs: 3-5 stairs;with supervision;with rolling walker Pt will Perform Home Exercise Program: with supervision, verbal cues required/provided PT Goal: Perform Home Exercise Program - Progress: Met  Visit Information  Last PT Received On: 04/17/12 Assistance Needed: +1    Subjective Data  Subjective: Pain is much better than it was.  Patient Stated Goal: Walk without pain.     Cognition  Overall Cognitive Status: Appears within functional limits for tasks assessed/performed Arousal/Alertness: Awake/alert Orientation Level: Appears intact for tasks assessed Behavior During Session: [facility] for tasks performed    Balance     End of Session PT - End of Session Equipment Utilized During Treatment: Gait belt;Right knee immobilizer Activity Tolerance: Patient tolerated treatment well Patient left: in chair;with call bell/phone within reach Nurse Communication: Mobility status   GP     Manolo Bosket 04/17/2012, 4:38 PM

## 2012-04-17 NOTE — Progress Notes (Signed)
Referral received for SNF. Chart reviewed and CSW has spoken with RNCM who indicates that patient is for DC to home with Home Health and DME.  CSW to sign off. Please re-consult if CSW needs arise.  Asna Muldrow T. Aricka Goldberger, LCSWA  [phone]  

## 2012-04-17 NOTE — Progress Notes (Signed)
I agree with the following treatment note after reviewing documentation.   Johnston, Ramyah Pankowski Brynn   OTR/L Pager: [phone] Office: [phone] .   

## 2012-04-17 NOTE — Progress Notes (Signed)
ANTICOAGULATION CONSULT NOTE - Follow-Consult  Pharmacy Consult for Coumadin Indication: VTE prophylaxis  Allergies  Allergen Reactions  . Penicillins     REACTION: rash    Patient Measurements: Weight 104.4 kg (04/09/12) Height 170 cm (04/09/12)  Vital Signs: Temp: 99 F (37.2 C) (10/25 0632) BP: 136/60 mmHg (10/25 0632) Pulse Rate: 92  (10/25 0632)  Labs:  Basename 04/17/12 0447 04/16/12 0605  HGB 11.8* 11.8*  HCT 34.6* 36.9  PLT PLATELET CLUMPS NOTED ON SMEAR, COUNT APPEARS ADEQUATE 279  APTT -- --  LABPROT 16.8* 13.9  INR 1.40 1.08  HEPARINUNFRC -- --  CREATININE 0.54 0.54  CKTOTAL -- --  CKMB -- --  TROPONINI -- --    The CrCl is unknown because both a height and weight (above a minimum accepted value) are required for this calculation.   Medical History: Past Medical History  Diagnosis Date  . Allergy   . Hyperlipidemia   . IBS (irritable bowel syndrome)   . Lumbar disc disease   . Colon polyps   . Anxiety   . Asthma     /w resp. upset   . GERD (gastroesophageal reflux disease)   . Headache     migraines -h/o  . Arthritis     back & knee    Medications:  Prescriptions prior to admission  Medication Sig Dispense Refill  . azelastine (ASTELIN) 137 MCG/SPRAY nasal spray Place 1 spray into the nose 2 (two) times daily.       . calcium-vitamin D (OSCAL WITH D) 500-200 MG-UNIT per tablet Take 1 tablet by mouth daily.      . DULoxetine (CYMBALTA) 30 MG capsule Take 3 capsules (90 mg total) by mouth daily.  90 capsule  8  . estradiol (ESTRACE) 0.5 MG tablet Take 0.5 mg by mouth daily.      [name] Fexofenadine-Pseudoephedrine (ALLEGRA-D 24 HOUR PO) Take 1 tablet by mouth at bedtime.       . metoCLOPramide (REGLAN) 10 MG tablet Take 10 mg by mouth at bedtime.      . Multiple Vitamin (MULTIVITAMIN) capsule Take 1 capsule by mouth daily.      . nabumetone (RELAFEN) 750 MG tablet Take 750 mg by mouth 2 (two) times daily.      [name] NASONEX 50 MCG/ACT nasal spray  Place 2 sprays into the nose daily.       [name] omeprazole (PRILOSEC) 20 MG capsule Take 20 mg by mouth daily before breakfast.       . ondansetron (ZOFRAN) 4 MG tablet Take 4 mg by mouth every 4 (four) hours as needed. For nausea      . Probiotic Product (ALIGN) 4 MG CAPS Take 4 mg by mouth daily.      . psyllium (METAMUCIL SMOOTH TEXTURE) 28 % packet Take 1 packet by mouth 2 (two) times daily.       . rosuvastatin (CRESTOR) 20 MG tablet Take 20 mg by mouth. Take 1 tab twice weekly on Tuesday and Friday      . SUMAtriptan (IMITREX) 100 MG tablet Take 1 tablet (100 mg total) by mouth every 2 (two) hours as needed for migraine.  10 tablet  0    Assessment:  INR = 1.4 today after 2 doses of 7.5mg  coumadin in this 89 YOF s/p TKA on 04/15/12 on Coumadin for VTE prophylaxis; also noted on Lovenox 30 mg Q12.  H/H stable, pltc clumped, count appears adequate per lab.  No bleeding reported. Lovenox to continue until  INR = or > 1.8.   Goal of Therapy:  INR 2-3 Platelet monitoring per protocol? Yes   Plan:  Warfarin 7.5 mg po x1 Daily INR.    Noah Delaine, RPh Clinical Pharmacist Pager: [phone] 04/17/2012 9:13 AM

## 2012-04-17 NOTE — Progress Notes (Signed)
I agree with the following treatment note after reviewing documentation.   Johnston, Karalyn Kadel Brynn   OTR/L Pager: [phone] Office: [phone] .   

## 2012-04-17 NOTE — Progress Notes (Signed)
Occupational Therapy Discharge Patient Details Name: [name] MRN: [id number] DOB: 1951-04-17 Today's Date: 04/17/2012 Time: 4098-1191 OT Time Calculation (min): 20 min  Patient discharged from OT services secondary to goals met and no further OT needs identified.  Please see latest therapy progress note for current level of functioning and progress toward goals.    Progress and discharge plan discussed with patient and/or caregiver: Patient/Caregiver agrees with plan  GO     [name] 04/17/2012, 1:44 PM

## 2012-04-17 NOTE — Progress Notes (Signed)
Physical Therapy Treatment Patient Details Name: [name] MRN: [id number] DOB: 10/02/1950 Today's Date: 04/17/2012 Time: 0810-0830 PT Time Calculation (min): 20 min  PT Assessment / Plan / Recommendation Comments on Treatment Session  Pt's pain limiting pt ability to participate in PT.  Performed manual joint mobilizations (grade 2 A-P glides with distraction to relieve pain.  Placed pt in CPM to increase ROM and relieve pain.  Set CPM to 45 degrees to minimize discomfort and allow pt to relax R LE.   Notified RN of pt's pain.     Follow Up Recommendations  Home health PT;Supervision - Intermittent     Barriers to Discharge        Equipment Recommendations  None recommended by PT    Recommendations for Other Services    Frequency 7X/week   Plan Discharge plan remains appropriate;Frequency remains appropriate    Precautions / Restrictions Precautions Precautions: Knee Precaution Booklet Issued: No Required Braces or Orthoses: Knee Immobilizer - Right Knee Immobilizer - Right: On except when in CPM Restrictions Weight Bearing Restrictions: Yes RLE Weight Bearing: Weight bearing as tolerated    Pertinent Vitals/Pain 9/10 R knee pain.  Pt medicated prior to session. Performed manual joint mobilizations (grade 2 A-P glides with distraction to relieve pain.  Placed pt in CPM to increase ROM and relieve pain.    Mobility  Bed Mobility Bed Mobility: Not assessed Transfers Transfers: Not assessed Ambulation/Gait Ambulation/Gait Assistance: Not tested (comment)    Exercises     PT Diagnosis:    PT Problem List:   PT Treatment Interventions:     PT Goals Acute Rehab PT Goals PT Goal Formulation: With patient Time For Goal Achievement: 04/23/12 Potential to Achieve Goals: Good  Visit Information  Last PT Received On: 04/17/12    Subjective Data  Subjective: I am in a lot more pain this morning.  Patient Stated Goal: Walk without pain.     Cognition  Overall Cognitive Status: Appears within functional limits for tasks assessed/performed Arousal/Alertness: Awake/alert Orientation Level: Appears intact for tasks assessed Behavior During Session: [facility] for tasks performed    Balance     End of Session PT - End of Session Activity Tolerance: Patient tolerated treatment well Patient left: in chair;with call bell/phone within reach Nurse Communication: Mobility status CPM Right Knee CPM Right Knee: On Right Knee Flexion (Degrees): 45  Right Knee Extension (Degrees): 0  Additional Comments: CPM on at 8:30 AM.    GP     Vivek Grealish 04/17/2012, 10:21 AM Theron Arista L. Rushil Kimbrell DPT [phone]

## 2012-04-17 NOTE — Progress Notes (Signed)
Physical Therapy Treatment Patient Details Name: [name] MRN: [id number] DOB: 1950/11/16 Today's Date: 04/17/2012 Time: 1030-1107 PT Time Calculation (min): 37 min  PT Assessment / Plan / Recommendation Comments on Treatment Session  Pt doing well with mobility, pain much better from earlier session.      Follow Up Recommendations  Home health PT;Supervision - Intermittent     Does the patient have the potential to tolerate intense rehabilitation     Barriers to Discharge        Equipment Recommendations  None recommended by PT    Recommendations for Other Services    Frequency 7X/week   Plan Discharge plan remains appropriate;Frequency remains appropriate    Precautions / Restrictions Precautions Precautions: Knee Precaution Booklet Issued: No Required Braces or Orthoses: Knee Immobilizer - Right Knee Immobilizer - Right: On except when in CPM Restrictions Weight Bearing Restrictions: Yes RLE Weight Bearing: Weight bearing as tolerated   Pertinent Vitals/Pain Pt reporting pain in R knee 5/10.  Premedicated.      Mobility  Bed Mobility Bed Mobility: Supine to Sit;Sitting - Scoot to Edge of Bed Supine to Sit: 6: Modified independent (Device/Increase time) Sitting - Scoot to Edge of Bed: 6: Modified independent (Device/Increase time) Sit to Supine: Not Tested (comment) Transfers Transfers: Sit to Stand;Stand to Sit;Stand Pivot Transfers Sit to Stand: 6: Modified independent (Device/Increase time);From bed;With upper extremity assist Stand to Sit: 6: Modified independent (Device/Increase time);To chair/3-in-1;With upper extremity assist Stand Pivot Transfers: 6: Modified independent (Device/Increase time) Details for Transfer Assistance: No instruction or assist needed.  Ambulation/Gait Ambulation/Gait Assistance: 5: Supervision Ambulation Distance (Feet): 200 Feet Assistive device: Rolling walker Ambulation/Gait Assistance Details: Instructed pt to manage  RW to allow for reciprocal gait pattern  Gait Pattern: Step-through pattern Stairs: No    Exercises Total Joint Exercises Ankle Circles/Pumps: AROM;Both;10 reps Heel Slides: Right;10 reps;Supine   PT Diagnosis:    PT Problem List:   PT Treatment Interventions:     PT Goals Acute Rehab PT Goals PT Goal Formulation: With patient Time For Goal Achievement: 04/23/12 Potential to Achieve Goals: Good Pt will go Supine/Side to Sit: with modified independence PT Goal: Supine/Side to Sit - Progress: Met Pt will go Sit to Supine/Side: with modified independence Pt will Transfer Bed to Chair/Chair to Bed: with modified independence PT Transfer Goal: Bed to Chair/Chair to Bed - Progress: Met Pt will Ambulate: >150 feet;with modified independence;with rolling walker PT Goal: Ambulate - Progress: Met Pt will Go Up / Down Stairs: 3-5 stairs;with supervision;with rolling walker PT Goal: Up/Down Stairs - Progress: Met Pt will Perform Home Exercise Program: with supervision, verbal cues required/provided  Visit Information  Last PT Received On: 04/17/12    Subjective Data  Subjective: Pain is much better than it was.  Patient Stated Goal: Walk without pain.     Cognition  Overall Cognitive Status: Appears within functional limits for tasks assessed/performed Arousal/Alertness: Awake/alert Orientation Level: Appears intact for tasks assessed Behavior During Session: [facility] for tasks performed    Balance     End of Session PT - End of Session Equipment Utilized During Treatment: Gait belt;Right knee immobilizer Activity Tolerance: Patient tolerated treatment well Patient left: in chair;with call bell/phone within reach Nurse Communication: Mobility status CPM Right Knee CPM Right Knee: Off Right Knee Flexion (Degrees): 45  Right Knee Extension (Degrees): 0  Additional Comments: Off at 10:30   GP     Latasha Buczkowski 04/17/2012, 11:29 AM Theron Arista L. [place] Freyre DPT [phone]

## 2012-04-17 NOTE — Progress Notes (Signed)
Subjective: Doing well.  Progressing with PT.  No bm yet.     Objective: Vital signs in last 24 hours: Temp:  [99 F (37.2 C)-99.6 F (37.6 C)] 99 F (37.2 C) (10/25 3086) Pulse Rate:  [92-102] 92  (10/25 0632) Resp:  [18] 18  (10/25 0632) BP: (127-138)/(57-60) 136/60 mmHg (10/25 0632) SpO2:  [90 %-97 %] 90 % (10/25 5784) Weight:  [104.401 kg (230 lb 2.6 oz)] 104.401 kg (230 lb 2.6 oz) (10/25 0900)  Intake/Output from previous day: 10/24 0701 - 10/25 0700 In: 360 [P.O.:360] Out: 600 [Urine:450; Drains:150] Intake/Output this shift: Total I/O In: 240 [P.O.:240] Out: -    Basename 04/17/12 0447 04/16/12 0605  HGB 11.8* 11.8*    Basename 04/17/12 0447 04/16/12 0605  WBC 12.4* 9.8  RBC 3.83* 3.93  HCT 34.6* 36.9  PLT PLATELET CLUMPS NOTED ON SMEAR, COUNT APPEARS ADEQUATE 279    Basename 04/17/12 0447 04/16/12 0605  NA 134* 137  K 3.5 4.1  CL 99 103  CO2 20 26  BUN 9 10  CREATININE 0.54 0.54  GLUCOSE 132* 142*  CALCIUM 8.3* 8.2*    Basename 04/17/12 0447 04/16/12 0605  LABPT -- --  INR 1.40 1.08    Exam:  Wound looks good.  Staples intact.  No signs of infection.  Calf nt, nvi.  Drain removed.    Assessment/Plan: D/c home today after has a bm.  F/u as scheduled.     [name] M 04/17/2012, 12:15 PM

## 2012-04-17 NOTE — Progress Notes (Signed)
Occupational Therapy Treatment Patient Details Name: [name] MRN: [id number] DOB: Apr 02, 1951 Today's Date: 04/17/2012 Time: 4098-1191 OT Time Calculation (min): 20 min  OT Assessment / Plan / Recommendation Comments on Treatment Session Pt is doing well. Able to complete mod independence with BADL's. Husband will help with any needs once home    Follow Up Recommendations  No OT follow up    Barriers to Discharge       Equipment Recommendations  None recommended by OT    Recommendations for Other Services    Frequency Min 2X/week   Plan All goals met and education completed, patient discharged from OT services    Precautions / Restrictions Precautions Precautions: Knee Precaution Booklet Issued: No Required Braces or Orthoses: Knee Immobilizer - Right Knee Immobilizer - Right: On except when in CPM Restrictions Weight Bearing Restrictions: Yes RLE Weight Bearing: Weight bearing as tolerated   Pertinent Vitals/Pain None reported, just received pain meds.     ADL  Lower Body Bathing: Simulated;Modified independent Where Assessed - Lower Body Bathing: Supported sit to stand Lower Body Dressing: Performed;Modified independent Where Assessed - Lower Body Dressing: Supported sit to Pharmacist, hospital: [employer]er, retail Method: Sit to Barista: Raised toilet seat without arms Transfers/Ambulation Related to ADLs: Supervision due to feeling "loopy" from pain meds.  ADL Comments: Bed mobility at supervision level. Educated on use of AE for LB bathing/dressing. Able to perform at mod independence level but states she would rather her husband (A) with her needs.     OT Diagnosis:    OT Problem List:   OT Treatment Interventions:     OT Goals Acute Rehab OT Goals OT Goal Formulation: With patient Time For Goal Achievement: 05/01/12 Potential to Achieve Goals: Good ADL Goals Pt Will Perform Lower Body Bathing:  with modified independence;Sit to stand from chair;with adaptive equipment ADL Goal: Lower Body Bathing - Progress: Met Pt Will Perform Lower Body Dressing: with modified independence;Sit to stand from chair;Sit to stand from bed;with adaptive equipment ADL Goal: Lower Body Dressing - Progress: Met  Visit Information  Last OT Received On: 04/17/12 Assistance Needed: +1    Subjective Data      Prior Functioning       Cognition  Overall Cognitive Status: Appears within functional limits for tasks assessed/performed Arousal/Alertness: Awake/alert Orientation Level: Appears intact for tasks assessed Behavior During Session: [facility] for tasks performed    Mobility  Shoulder Instructions Bed Mobility Bed Mobility: Supine to Sit;Sit to Supine;Sitting - Scoot to Edge of Bed Supine to Sit: 6: Modified independent (Device/Increase time) Sitting - Scoot to Edge of Bed: 6: Modified independent (Device/Increase time) Sit to Supine: 6: Modified independent (Device/Increase time) Transfers Transfers: Sit to Stand;Stand to Sit Sit to Stand: From bed;With upper extremity assist;5: Supervision Stand to Sit: To chair/3-in-1;With upper extremity assist;5: Supervision Details for Transfer Assistance: Supervision only due to pt stating she felt loopy from pain meds.        Exercises  Total Joint Exercises Ankle Circles/Pumps: AROM;Both;10 reps Heel Slides: Right;10 reps;Supine   Balance     End of Session CPM Right Knee CPM Right Knee: On Additional Comments: On at 1:30   GO     [name] 04/17/2012, 1:43 PM

## 2012-04-17 NOTE — Progress Notes (Signed)
Pt has order to be d/c home today.  Zonia Kief, PA says that pt has the option to d/c today or tomorrow, depending when pt feels ready.  The pt wants to be d/c home tomorrow 10/26.

## 2012-04-17 NOTE — Progress Notes (Signed)
[facility]  Patient Status: New  [facility] is providing the following services: RN and PT  Note plan for discharge Saturday - is on the schedule for RN and PT visits at home Sunday  If patient discharges after hours, please call [phone].   Jodene Nam 04/17/2012, 4:22 PM

## 2012-04-18 DIAGNOSIS — Z9889 Other specified postprocedural states: Secondary | ICD-10-CM | POA: Diagnosis present

## 2012-04-18 LAB — BASIC METABOLIC PANEL
BUN: 12 mg/dL (ref 6–23)
CO2: 27 mEq/L (ref 19–32)
Calcium: 8.1 mg/dL — ABNORMAL LOW (ref 8.4–10.5)
Chloride: 99 mEq/L (ref 96–112)
Creatinine, Ser: 0.49 mg/dL — ABNORMAL LOW (ref 0.50–1.10)
GFR calc Af Amer: >90 mL/min (ref >90)
GFR calc non Af Amer: >90 mL/min (ref >90)
Glucose, Bld: 106 mg/dL — ABNORMAL HIGH (ref 70–99)
Potassium: 3.4 mEq/L — ABNORMAL LOW (ref 3.5–5.1)
Sodium: 136 mEq/L (ref 135–145)

## 2012-04-18 LAB — CBC
HCT: 31.6 % — ABNORMAL LOW (ref 36.0–46.0)
Hemoglobin: 10.6 g/dL — ABNORMAL LOW (ref 12.0–15.0)
MCH: 30.5 pg (ref 26.0–34.0)
MCHC: 33.5 g/dL (ref 30.0–36.0)
MCV: 90.8 fL (ref 78.0–100.0)
Platelets: 246 10*3/uL (ref 150–400)
RBC: 3.48 MIL/uL — ABNORMAL LOW (ref 3.87–5.11)
RDW: 12.3 % (ref 11.5–15.5)
WBC: 10 10*3/uL (ref 4.0–10.5)

## 2012-04-18 LAB — PROTIME-INR
INR: 1.7 — ABNORMAL HIGH (ref 0.00–1.49)
Prothrombin Time: 19.4 seconds — ABNORMAL HIGH (ref 11.6–15.2)

## 2012-04-18 NOTE — Progress Notes (Signed)
Pt. Has been offered laxative x 2 today and refuses.  + flatus. + soft abd.  + distention.

## 2012-04-18 NOTE — Progress Notes (Signed)
Pt. Ed. Done for Lovenox - verbalizes understanding and correct injection demonstration done by pt.  No questions.

## 2012-04-18 NOTE — Progress Notes (Signed)
Physical Therapy Treatment Patient Details Name: [name] MRN: [id number] DOB: [birth year] Today's Date: 04/18/2012 Time: 4098-1191 PT Time Calculation (min): 24 min  PT Assessment / Plan / Recommendation Comments on Treatment Session  Patient was limited today due to complaints of lightheadedness. VSS. Patient believes it was because she hadn't eaten. Will attempt to increase ambulation later this afternoon. Patient plans to DC home today if medically ready    Follow Up Recommendations  Home health PT;Supervision - Intermittent     Does the patient have the potential to tolerate intense rehabilitation     Barriers to Discharge        Equipment Recommendations  None recommended by PT    Recommendations for Other Services    Frequency 7X/week   Plan Discharge plan remains appropriate;Frequency remains appropriate    Precautions / Restrictions Precautions Required Braces or Orthoses: Knee Immobilizer - Right Knee Immobilizer - Right: On except when in CPM Restrictions Weight Bearing Restrictions: Yes RLE Weight Bearing: Weight bearing as tolerated   Pertinent Vitals/Pain 5/10 R knee pain    Mobility  Bed Mobility Supine to Sit: 6: Modified independent (Device/Increase time) Transfers Sit to Stand: 5: Supervision;From bed;With upper extremity assist;From toilet Stand to Sit: With upper extremity assist;To chair/3-in-1;5: Supervision;To toilet Details for Transfer Assistance: Cues for safe hand placement and not to pull up from RW Ambulation/Gait Ambulation/Gait Assistance: 4: Min guard Ambulation Distance (Feet): 60 Feet Assistive device: Rolling walker Ambulation/Gait Assistance Details: Patient stated she felt lightheaded with ambulation. BP normal. Patient returned to recliner.  Gait Pattern: Step-through pattern;Decreased stride length    Exercises Total Joint Exercises Ankle Circles/Pumps: AROM;Right;15 reps Quad Sets: AROM;Right;15 reps Short Arc Quad:  AROM;Right;15 reps Heel Slides: AAROM;Right;15 reps Hip ABduction/ADduction: AROM;Right;15 reps Straight Leg Raises: AAROM;Right;15 reps   PT Diagnosis:    PT Problem List:   PT Treatment Interventions:     PT Goals Acute Rehab PT Goals PT Goal: Supine/Side to Sit - Progress: Met PT Transfer Goal: Bed to Chair/Chair to Bed - Progress: Progressing toward goal PT Goal: Ambulate - Progress: Progressing toward goal PT Goal: Perform Home Exercise Program - Progress: Progressing toward goal  Visit Information  Last PT Received On: 04/18/12 Assistance Needed: +1    Subjective Data      Cognition  Overall Cognitive Status: Appears within functional limits for tasks assessed/performed Arousal/Alertness: Awake/alert Orientation Level: Appears intact for tasks assessed Behavior During Session: [facility] for tasks performed    Balance     End of Session PT - End of Session Equipment Utilized During Treatment: Gait belt;Right knee immobilizer Activity Tolerance: Patient limited by fatigue Patient left: in chair;with call bell/phone within reach Nurse Communication: Mobility status   GP     Fredrich Birks 04/18/2012, 9:23 AM 04/18/2012 Fredrich Birks PTA [phone] pager [phone] office

## 2012-04-18 NOTE — Progress Notes (Signed)
Physical Therapy Treatment Patient Details Name: [name] MRN: [id number] DOB: February 07, 1951 Today's Date: 04/18/2012 Time: 0865-7846 PT Time Calculation (min): 26 min  PT Assessment / Plan / Recommendation Comments on Treatment Session  Patient feeling better this afternoon and able to ambulate without any issues of feeling lightheadedness. Patient progressing well . Patient given HEP.    Follow Up Recommendations  Home health PT;Supervision - Intermittent     Does the patient have the potential to tolerate intense rehabilitation     Barriers to Discharge        Equipment Recommendations  None recommended by PT    Recommendations for Other Services    Frequency 7X/week   Plan Discharge plan remains appropriate;Frequency remains appropriate    Precautions / Restrictions Precautions Precautions: Knee Required Braces or Orthoses: Knee Immobilizer - Right Knee Immobilizer - Right: On except when in CPM Restrictions RLE Weight Bearing: Weight bearing as tolerated   Pertinent Vitals/Pain     Mobility  Bed Mobility Supine to Sit: 6: Modified independent (Device/Increase time) Sit to Supine: 6: Modified independent (Device/Increase time) Transfers Sit to Stand: 6: Modified independent (Device/Increase time) Stand to Sit: 6: Modified independent (Device/Increase time) Ambulation/Gait Ambulation/Gait Assistance: 5: Supervision Ambulation Distance (Feet): 150 Feet Assistive device: Rolling walker Ambulation/Gait Assistance Details: Supervision for safety Gait Pattern: Step-through pattern;Decreased stride length    Exercises     PT Diagnosis:    PT Problem List:   PT Treatment Interventions:     PT Goals Acute Rehab PT Goals PT Goal: Supine/Side to Sit - Progress: Met PT Goal: Sit to Supine/Side - Progress: Met PT Transfer Goal: Bed to Chair/Chair to Bed - Progress: Met PT Goal: Ambulate - Progress: Progressing toward goal  Visit Information  Last PT  Received On: 04/18/12 Assistance Needed: +1    Subjective Data      Cognition  Overall Cognitive Status: Appears within functional limits for tasks assessed/performed Arousal/Alertness: Awake/alert Orientation Level: Appears intact for tasks assessed Behavior During Session: [facility] for tasks performed    Balance     End of Session PT - End of Session Equipment Utilized During Treatment: Gait belt;Right knee immobilizer Activity Tolerance: Patient tolerated treatment well Patient left: in chair;with call bell/phone within reach Nurse Communication: Mobility status   GP     Fredrich Birks 04/18/2012, 2:18 PM 04/18/2012 Fredrich Birks PTA [phone] pager [phone] office  04/18/2012 Fredrich Birks PTA [phone] pager [phone] office

## 2012-04-23 NOTE — Discharge Summary (Signed)
Physician Discharge Summary  Patient ID: [name] MRN: [id number] DOB/AGE: 1951/06/20 61 y.o.  Admit date: 04/15/2012 Discharge date: 04/23/2012  Admission Diagnoses:  Right knee djd  Discharge Diagnoses:  Principal Problem:  *Right knee DJD Active Problems:  HYPERLIPIDEMIA  OBESITY, MORBID  ALLERGIC RHINITIS  ASTHMA  Irritable bowel syndrome  BACK PAIN, LUMBAR, WITH RADICULOPATHY  WEIGHT LOSS  EUSTACHIAN TUBE DYSFUNCTION, CHRONIC   Discharged Condition: good  Hospital Course: 61 yo wf with hx of end stage djd right knee was taken to the OR 23 oct for right total knee replacement.  Tolerated procedure well without complications.  Transferred to the ortho unit and dvt prophylaxis started with coumadin and lovenox.  24 oct, doing well.  No complaints.  Rehab started.  25 oct, dressing changed.  Wound looked good.  No drainage or signs of infection.  Drain removed.  26 oct, doing well, no complaints.  Excellent progress with therapy and was d/c home in good condition.   Consults: None  Discharge Exam: Blood pressure 156/86, pulse 84, temperature 98.5 F (36.9 C), temperature source Oral, resp. rate 18, height 5\' 7"  (1.702 m), weight 104.401 kg (230 lb 2.6 oz), SpO2 96.00%.   Disposition: 06-Home-Health Care Svc  Discharge Orders    Future Appointments: Provider: Department: Dept Phone: Center:   05/12/2012 11:15 AM Stacie Glaze, MD [facility] [phone] [facility]     Future Orders Please Complete By Expires   Diet - low sodium heart healthy      Call MD / Call 911      Comments:   If you experience chest pain or shortness of breath, CALL 911 and be transported to the hospital emergency room.  If you develope a fever above 101 F, pus (white drainage) or increased drainage or redness at the wound, or calf pain, call your surgeon's office.   Constipation Prevention      Comments:   Drink plenty of fluids.  Prune juice may be helpful.  You may use a stool  softener, such as Colace (over the counter) 100 mg twice a day.  Use MiraLax (over the counter) for constipation as needed.   Increase activity slowly as tolerated      Discharge instructions      Comments:   Ok to shower, but no tub soaking.  Do not apply any creams or ointments to incision.  Continue physical therapy protocol.   Driving restrictions      Comments:   No driving until further notice.   CPM      Comments:   Continuous passive motion machine (CPM):      Use the CPM from 0 to 70 degrees for 6-8 hours per day.      You may increase by 10 degrees per day as tolerated.  You may break it up into 2 or 3 sessions per day.      Use CPM for 3-4 weeks or until you are told to stop.   TED hose      Comments:   Use stockings (TED hose) for 3-4 weeks on both leg(s).  You may remove them at night for sleeping.   Change dressing      Comments:   Change dressing on left knee daily with sterile 4 x 4 inch gauze dressing and apply TED hose.   Do not put a pillow under the knee. Place it under the heel.          Medication List  As of 04/23/2012 12:41 PM    STOP taking these medications         ALIGN 4 MG Caps      multivitamin capsule      nabumetone 750 MG tablet   Commonly known as: RELAFEN      TAKE these medications         ALLEGRA-D 24 HOUR PO   Take 1 tablet by mouth at bedtime.      azelastine 137 MCG/SPRAY nasal spray   Commonly known as: ASTELIN   Place 1 spray into the nose 2 (two) times daily.      calcium-vitamin D 500-200 MG-UNIT per tablet   Commonly known as: OSCAL WITH D   Take 1 tablet by mouth daily.      DULoxetine 30 MG capsule   Commonly known as: CYMBALTA   Take 3 capsules (90 mg total) by mouth daily.      enoxaparin 30 MG/0.3ML injection   Commonly known as: LOVENOX   Inject 0.3 mLs (30 mg total) into the skin every 12 (twelve) hours.      estradiol 0.5 MG tablet   Commonly known as: ESTRACE   Take 0.5 mg by mouth daily.       methocarbamol 500 MG tablet   Commonly known as: ROBAXIN   Take 1 tablet (500 mg total) by mouth every 6 (six) hours as needed (muscle spasms).      metoCLOPramide 10 MG tablet   Commonly known as: REGLAN   Take 10 mg by mouth at bedtime.      NASONEX 50 MCG/ACT nasal spray   Generic drug: mometasone   Place 2 sprays into the nose daily.      omeprazole 20 MG capsule   Commonly known as: PRILOSEC   Take 20 mg by mouth daily before breakfast.      ondansetron 4 MG tablet   Commonly known as: ZOFRAN   Take 4 mg by mouth every 4 (four) hours as needed. For nausea      oxyCODONE-acetaminophen 7.5-325 MG per tablet   Commonly known as: PERCOCET   Take 1-2 tablets by mouth every 4 (four) hours as needed for pain.      psyllium 28 % packet   Commonly known as: METAMUCIL SMOOTH TEXTURE   Take 1 packet by mouth 2 (two) times daily.      rosuvastatin 20 MG tablet   Commonly known as: CRESTOR   Take 20 mg by mouth. Take 1 tab twice weekly on Tuesday and Friday      SUMAtriptan 100 MG tablet   Commonly known as: IMITREX   Take 1 tablet (100 mg total) by mouth every 2 (two) hours as needed for migraine.      warfarin 5 MG tablet   Commonly known as: COUMADIN   Take 1 tablet (5 mg total) by mouth daily.           Follow-up Information    Follow up with Loreta Ave, MD. (need return office visit 2 weeks postop)    Contact information:   [address] [place] [address] [phone]          Signed: Naida Sleight 04/23/2012, 12:41 PM

## 2012-05-05 ENCOUNTER — Telehealth: Payer: Self-pay | Admitting: Family Medicine

## 2012-05-05 NOTE — Telephone Encounter (Signed)
Per dr Lovell Sheehan- not unusual for pt with pain after tka. continue to monitor

## 2012-05-05 NOTE — Telephone Encounter (Signed)
Patient had R TKA on 10-23. BPs have been running high. BP reading 11/4: 156/90, 11/7: 143/96, 11/11: 137/91. Nurse wanted to make doctor aware.

## 2012-05-11 ENCOUNTER — Ambulatory Visit: Payer: BC Managed Care – PPO | Attending: Orthopedic Surgery | Admitting: Physical Therapy

## 2012-05-11 DIAGNOSIS — M25669 Stiffness of unspecified knee, not elsewhere classified: Secondary | ICD-10-CM | POA: Insufficient documentation

## 2012-05-11 DIAGNOSIS — R262 Difficulty in walking, not elsewhere classified: Secondary | ICD-10-CM | POA: Insufficient documentation

## 2012-05-11 DIAGNOSIS — M25569 Pain in unspecified knee: Secondary | ICD-10-CM | POA: Insufficient documentation

## 2012-05-11 DIAGNOSIS — Z96659 Presence of unspecified artificial knee joint: Secondary | ICD-10-CM | POA: Insufficient documentation

## 2012-05-11 DIAGNOSIS — IMO0001 Reserved for inherently not codable concepts without codable children: Secondary | ICD-10-CM | POA: Insufficient documentation

## 2012-05-12 ENCOUNTER — Ambulatory Visit (INDEPENDENT_AMBULATORY_CARE_PROVIDER_SITE_OTHER): Payer: BC Managed Care – PPO | Admitting: Internal Medicine

## 2012-05-12 ENCOUNTER — Encounter: Payer: Self-pay | Admitting: Internal Medicine

## 2012-05-12 VITALS — BP 126/74 | HR 72 | Temp 98.2°F | Resp 16 | Ht 67.0 in | Wt 220.0 lb

## 2012-05-12 DIAGNOSIS — Z9889 Other specified postprocedural states: Secondary | ICD-10-CM

## 2012-05-12 DIAGNOSIS — K589 Irritable bowel syndrome without diarrhea: Secondary | ICD-10-CM

## 2012-05-12 LAB — CBC WITH DIFFERENTIAL/PLATELET
Basophils Absolute: 0 10*3/uL (ref 0.0–0.1)
Basophils Relative: 0.6 % (ref 0.0–3.0)
Eosinophils Absolute: 0 10*3/uL (ref 0.0–0.7)
Eosinophils Relative: 0.5 % (ref 0.0–5.0)
HCT: 39.7 % (ref 36.0–46.0)
Hemoglobin: 12.8 g/dL (ref 12.0–15.0)
Lymphocytes Relative: 33.7 % (ref 12.0–46.0)
Lymphs Abs: 2.4 10*3/uL (ref 0.7–4.0)
MCHC: 32.2 g/dL (ref 30.0–36.0)
MCV: 91.4 fl (ref 78.0–100.0)
Monocytes Absolute: 0.6 10*3/uL (ref 0.1–1.0)
Monocytes Relative: 9 % (ref 3.0–12.0)
Neutro Abs: 4.1 10*3/uL (ref 1.4–7.7)
Neutrophils Relative %: 56.2 % (ref 43.0–77.0)
Platelets: 469 10*3/uL — ABNORMAL HIGH (ref 150.0–400.0)
RBC: 4.34 Mil/uL (ref 3.87–5.11)
RDW: 13.4 % (ref 11.5–14.6)
WBC: 7.2 10*3/uL (ref 4.5–10.5)

## 2012-05-12 LAB — BASIC METABOLIC PANEL
BUN: 13 mg/dL (ref 6–23)
CO2: 28 mEq/L (ref 19–32)
Calcium: 9.2 mg/dL (ref 8.4–10.5)
Chloride: 97 mEq/L (ref 96–112)
Creatinine, Ser: 0.6 mg/dL (ref 0.4–1.2)
GFR: 100.12 mL/min (ref >60.00)
Glucose, Bld: 121 mg/dL — ABNORMAL HIGH (ref 70–99)
Potassium: 4.7 mEq/L (ref 3.5–5.1)
Sodium: 134 mEq/L — ABNORMAL LOW (ref 135–145)

## 2012-05-12 NOTE — Progress Notes (Signed)
Subjective:    Patient ID: [name], female    DOB: [birth year], 61 y.o.   MRN: [id number]  HPI  IBS stable/ reflux stable Weight down Has lost weight Post TKR with PT Proceeding   Review of Systems  Constitutional: Negative for activity change, appetite change and fatigue.  HENT: Negative for ear pain, congestion, neck pain, postnasal drip and sinus pressure.   Eyes: Negative for redness and visual disturbance.  Respiratory: Negative for cough, shortness of breath and wheezing.   Cardiovascular: Positive for leg swelling.  Gastrointestinal: Negative for abdominal pain and abdominal distention.  Genitourinary: Negative for dysuria, frequency and menstrual problem.  Musculoskeletal: Positive for myalgias, joint swelling and arthralgias.  Skin: Negative for rash and wound.  Neurological: Negative for dizziness, weakness and headaches.  Hematological: Negative for adenopathy. Does not bruise/bleed easily.  Psychiatric/Behavioral: Negative for sleep disturbance and decreased concentration.   Past Medical History  Diagnosis Date  . Allergy   . Hyperlipidemia   . IBS (irritable bowel syndrome)   . Lumbar disc disease   . Colon polyps   . Anxiety   . Asthma     /w resp. upset   . GERD (gastroesophageal reflux disease)   . Headache     migraines -h/o  . Arthritis     back & knee    History   Social History  . Marital Status: Married    Spouse Name: N/A    Number of Children: 1  . Years of Education: N/A   Occupational History  . Retired    Social History Main Topics  . Smoking status: Never Smoker   . Smokeless tobacco: Never Used  . Alcohol Use: No  . Drug Use: No  . Sexually Active: Yes   Other Topics Concern  . Not on file   Social History Narrative  . No narrative on file    Past Surgical History  Procedure Date  . Abdominal hysterectomy   . Oophorectomy   . Carpal tunnel release     bilateral   . Esophagogastroduodenoscopy   . Rotator  cuff repair     left   . Lasik   . Breast surgery     augmetation, implants- 1980  . Cholecystectomy     laparoscopic   . Bladder suspension     2007  . Total knee arthroplasty 04/15/2012    RIGHT KNEE  . Knee arthroscopy 04/15/2012    Procedure: ARTHROSCOPY KNEE;  Surgeon: Loreta Ave, MD;  Location: [facility] OR;  Service: Orthopedics;  Laterality: Right;  . Total knee arthroplasty 04/15/2012    Procedure: TOTAL KNEE ARTHROPLASTY;  Surgeon: Loreta Ave, MD;  Location: [facility] OR;  Service: Orthopedics;  Laterality: Right;    Family History  Problem Relation Age of Onset  . Diabetes Mother   . Heart disease Father   . Colon polyps Mother   . Colon polyps Maternal Aunt   . Irritable bowel syndrome Mother     Allergies  Allergen Reactions  . Penicillins     REACTION: rash    Current Outpatient Prescriptions on File Prior to Visit  Medication Sig Dispense Refill  . azelastine (ASTELIN) 137 MCG/SPRAY nasal spray Place 1 spray into the nose 2 (two) times daily.       . calcium-vitamin D (OSCAL WITH D) 500-200 MG-UNIT per tablet Take 1 tablet by mouth daily.      . DULoxetine (CYMBALTA) 30 MG capsule Take 3 capsules (90 mg total) by  mouth daily.  90 capsule  8  . estradiol (ESTRACE) 0.5 MG tablet Take 0.5 mg by mouth daily.      [name] Fexofenadine-Pseudoephedrine (ALLEGRA-D 24 HOUR PO) Take 1 tablet by mouth at bedtime.       . methocarbamol (ROBAXIN) 500 MG tablet Take 1 tablet (500 mg total) by mouth every 6 (six) hours as needed (muscle spasms).  60 tablet  0  . metoCLOPramide (REGLAN) 10 MG tablet Take 10 mg by mouth at bedtime.      [name] NASONEX 50 MCG/ACT nasal spray Place 2 sprays into the nose daily.       [name] omeprazole (PRILOSEC) 20 MG capsule Take 20 mg by mouth daily before breakfast.       . ondansetron (ZOFRAN) 4 MG tablet Take 4 mg by mouth every 4 (four) hours as needed. For nausea      . oxyCODONE-acetaminophen (PERCOCET) 7.5-325 MG per tablet Take 1-2 tablets by mouth  every 4 (four) hours as needed for pain.  60 tablet  0  . psyllium (METAMUCIL SMOOTH TEXTURE) 28 % packet Take 1 packet by mouth 2 (two) times daily.       . rosuvastatin (CRESTOR) 20 MG tablet Take 20 mg by mouth. Take 1 tab twice weekly on Tuesday and Friday      . SUMAtriptan (IMITREX) 100 MG tablet Take 1 tablet (100 mg total) by mouth every 2 (two) hours as needed for migraine.  10 tablet  0    BP 126/74  Pulse 72  Temp 98.2 F (36.8 C)  Resp 16  Ht 5\' 7"  (1.702 m)  Wt 220 lb (99.791 kg)  BMI 34.46 kg/m2       Objective:   Physical Exam  Vitals reviewed. Constitutional: She is oriented to person, place, and time. She appears well-developed and well-nourished. No distress.  HENT:  Head: Normocephalic and atraumatic.  Right Ear: External ear normal.  Left Ear: External ear normal.  Nose: Nose normal.  Mouth/Throat: Oropharynx is clear and moist.  Eyes: Conjunctivae normal and EOM are normal. Pupils are equal, round, and reactive to light.  Neck: Normal range of motion. Neck supple. No JVD present. No tracheal deviation present. No thyromegaly present.  Cardiovascular: Normal rate, regular rhythm, normal heart sounds and intact distal pulses.   No murmur heard. Pulmonary/Chest: Effort normal and breath sounds normal. She has no wheezes. She exhibits no tenderness.  Abdominal: Soft. Bowel sounds are normal.  Musculoskeletal: Normal range of motion. She exhibits no edema and no tenderness.  Lymphadenopathy:    She has no cervical adenopathy.  Neurological: She is alert and oriented to person, place, and time. She has normal reflexes. No cranial nerve deficit.  Skin: Skin is warm and dry. She is not diaphoretic.  Psychiatric: She has a normal mood and affect. Her behavior is normal.          Assessment & Plan:  Samples of cymbalta Stable GERD reveiwed hospitalization records Check cbc for post op anemia Check Bmet

## 2012-05-13 ENCOUNTER — Ambulatory Visit: Payer: BC Managed Care – PPO | Admitting: Physical Therapy

## 2012-05-15 ENCOUNTER — Ambulatory Visit: Payer: BC Managed Care – PPO | Admitting: Physical Therapy

## 2012-05-18 ENCOUNTER — Ambulatory Visit: Payer: BC Managed Care – PPO | Admitting: Physical Therapy

## 2012-05-20 ENCOUNTER — Ambulatory Visit: Payer: BC Managed Care – PPO | Admitting: Physical Therapy

## 2012-05-25 ENCOUNTER — Ambulatory Visit: Payer: BC Managed Care – PPO | Attending: Orthopedic Surgery | Admitting: Physical Therapy

## 2012-05-25 DIAGNOSIS — R262 Difficulty in walking, not elsewhere classified: Secondary | ICD-10-CM | POA: Insufficient documentation

## 2012-05-25 DIAGNOSIS — M25669 Stiffness of unspecified knee, not elsewhere classified: Secondary | ICD-10-CM | POA: Insufficient documentation

## 2012-05-25 DIAGNOSIS — Z96659 Presence of unspecified artificial knee joint: Secondary | ICD-10-CM | POA: Insufficient documentation

## 2012-05-25 DIAGNOSIS — M25569 Pain in unspecified knee: Secondary | ICD-10-CM | POA: Insufficient documentation

## 2012-05-25 DIAGNOSIS — IMO0001 Reserved for inherently not codable concepts without codable children: Secondary | ICD-10-CM | POA: Insufficient documentation

## 2012-05-27 ENCOUNTER — Ambulatory Visit: Payer: BC Managed Care – PPO | Admitting: Physical Therapy

## 2012-05-28 ENCOUNTER — Ambulatory Visit: Payer: BC Managed Care – PPO | Admitting: Physical Therapy

## 2012-06-01 ENCOUNTER — Ambulatory Visit: Payer: BC Managed Care – PPO | Admitting: Physical Therapy

## 2012-06-03 ENCOUNTER — Ambulatory Visit: Payer: BC Managed Care – PPO | Admitting: Physical Therapy

## 2012-06-04 ENCOUNTER — Ambulatory Visit: Payer: BC Managed Care – PPO | Admitting: Physical Therapy

## 2012-06-08 ENCOUNTER — Ambulatory Visit: Payer: BC Managed Care – PPO | Admitting: Physical Therapy

## 2012-06-10 ENCOUNTER — Ambulatory Visit: Payer: BC Managed Care – PPO | Admitting: Physical Therapy

## 2012-06-11 ENCOUNTER — Ambulatory Visit: Payer: BC Managed Care – PPO | Admitting: Physical Therapy

## 2012-06-15 ENCOUNTER — Ambulatory Visit: Payer: BC Managed Care – PPO | Admitting: Physical Therapy

## 2012-06-18 ENCOUNTER — Ambulatory Visit: Payer: BC Managed Care – PPO | Admitting: Physical Therapy

## 2012-06-22 ENCOUNTER — Ambulatory Visit: Payer: BC Managed Care – PPO | Admitting: Physical Therapy

## 2012-06-23 ENCOUNTER — Ambulatory Visit: Payer: BC Managed Care – PPO | Admitting: Physical Therapy

## 2012-07-11 ENCOUNTER — Other Ambulatory Visit: Payer: Self-pay | Admitting: Internal Medicine

## 2012-07-21 ENCOUNTER — Other Ambulatory Visit: Payer: Self-pay | Admitting: Internal Medicine

## 2012-08-08 ENCOUNTER — Ambulatory Visit (INDEPENDENT_AMBULATORY_CARE_PROVIDER_SITE_OTHER): Payer: BC Managed Care – PPO | Admitting: Family Medicine

## 2012-08-08 ENCOUNTER — Encounter: Payer: Self-pay | Admitting: Family Medicine

## 2012-08-08 ENCOUNTER — Ambulatory Visit: Payer: Self-pay | Admitting: Internal Medicine

## 2012-08-08 VITALS — BP 138/80 | Temp 99.1°F | Wt 224.0 lb

## 2012-08-08 DIAGNOSIS — J019 Acute sinusitis, unspecified: Secondary | ICD-10-CM

## 2012-08-08 MED ORDER — DOXYCYCLINE HYCLATE 100 MG PO TABS: 100.0000 mg | ORAL_TABLET | Freq: Two times a day (BID) | ORAL | Status: DC

## 2012-08-08 NOTE — Progress Notes (Signed)
duration of symptoms: about 1 week rhinorrhea:yes Congestion: worse on L side of face ear pain:yes sore throat:yes Cough:yes, likely from post nasal gtt, some production Myalgias: at baseline other concerns: headache for the last week, head feels 'full' H/o deviated septum No fevers >100.4   ROS: See HPI.  Otherwise negative.    Meds, vitals, and allergies reviewed.   GEN: nad, alert and oriented HEENT: mucous membranes moist, TM w/o erythema, nasal epithelium injected, OP with cobblestoning, L>R max and frontal sinuses ttp NECK: supple w/o LA CV: rrr. PULM: ctab, no inc wob ABD: soft, +bs EXT: no edema

## 2012-08-08 NOTE — Patient Instructions (Addendum)
Start the antibiotics today.  Drink plenty of fluids, take tylenol as needed, and gargle with warm salt water for your throat if needed. .  This should gradually improve.  Take care.  Let us know if you have other concerns.

## 2012-08-08 NOTE — Assessment & Plan Note (Signed)
Nontoxic.  Continue nasal sprays, rest/fluids and start doxy.  F/u prn.  D/w pt.  She agrees.

## 2012-08-10 ENCOUNTER — Telehealth: Payer: Self-pay | Admitting: Internal Medicine

## 2012-08-10 ENCOUNTER — Other Ambulatory Visit: Payer: Self-pay | Admitting: *Deleted

## 2012-08-10 MED ORDER — AZITHROMYCIN 250 MG PO TABS: ORAL_TABLET | ORAL | Status: DC

## 2012-08-10 NOTE — Telephone Encounter (Signed)
[facility] Triage Call Report Triage Record Num: [id number] Operator: Chevis Pretty Patient Name: [name] Call Date & Time: 08/07/2012 4:34:55PM Patient Phone: [phone] PCP: Darryll Capers Patient Gender: Female PCP Fax : [phone] Patient DOB: Dec 16, 1950 Practice Name: [facility] Reason for Call: Caller: [name]/Patient; PCP: Darryll Capers (Adults only); CB#: [phone]; Call regarding Cough/Congestion; states onset of cough, congestion, and nasal drainage 08/04/12 but has felt worse over past 24 hours. Afebrile. Per URI protocol, emergent symptoms denied; advised appt within 24 hours. Appt scheduled 08/08/12 1000 at [facility] office with Dr. Alwyn Ren. Protocol(s) Used: Upper Respiratory Infection (URI) Recommended Outcome per Protocol: See Provider within 24 hours Reason for Outcome: Mild to moderate headache for more than 24 hours unrelieved with nonprescription medications Care Advice: ~ Use a cool mist humidifier to moisten air. Be sure to clean according to manufacturer's instructions. Call provider if headache worsens, develops temperature greater than 101.31F (38.1C) or any temperature elevation in a geriatric or immunocompromised patient (such as diabetes, HIV/AIDS, chemotherapy, organ transplant, or chronic steroid use). ~ Drink more fluids -- water, low-sugar juices, tea and warm soup, especially chicken broth, are options. Avoid caffeinated or alcoholic beverages because they can increase the chance of dehydration. ~ ~ SYMPTOM / CONDITION MANAGEMENT ~ CAUTIONS A warm, moist compress placed on face, over eyes for 15 to 20 minutes, 5 to 6 times a day, may help relieve the congestion. ~ Analgesic/Antipyretic Advice - Acetaminophen: Consider acetaminophen as directed on label or by pharmacist/provider for pain or fever. PRECAUTIONS: - Use only if there is no history of liver disease, alcoholism, or intake of three or more alcohol drinks per day. - If  approved by provider when breastfeeding. - Do not exceed recommended dose or frequency. ~ Analgesic/Antipyretic Advice - NSAIDs: Consider aspirin, ibuprofen, naproxen or ketoprofen for pain or fever as directed on label or by pharmacist/provider. PRECAUTIONS: - If over 68 years of age, should not take longer than 1 week without consulting provider. EXCEPTIONS: - Should not be used if taking blood thinners or have bleeding problems. - Do not use if have history of sensitivity/allergy to any of these medications; or history of cardiovascular, ulcer, kidney, liver disease or diabetes unless approved by provider. - Do not exceed recommended dose or frequency. ~ Total water intake includes drinking water, water in beverages, and water contained in food. Fluids make up about 80% of the body's total hydration need. Individual fluid requirement to maintain hydration vary based on physical activity, environmental factors and illness. Limit fluids that contain sugar, caffeine, or alcohol. Urine will be very light yellow color when you drink enough fluids. ~ 08/07/2012 4:44:42PM Page 1 of 1 [email or web address]

## 2012-09-08 ENCOUNTER — Other Ambulatory Visit: Payer: Self-pay | Admitting: Internal Medicine

## 2012-09-23 ENCOUNTER — Other Ambulatory Visit: Payer: Self-pay | Admitting: Internal Medicine

## 2012-10-05 ENCOUNTER — Telehealth: Payer: Self-pay | Admitting: Internal Medicine

## 2012-10-05 NOTE — Telephone Encounter (Signed)
Patient Information:  Caller Name: Veralyn  Phone: [phone]  Patient: [name]  Gender: Female  DOB: [birth year]  Age: 62 Years  PCP: Darryll Capers (Adults only)  Office Follow Up:  Does the office need to follow up with this patient?: No  Instructions For The Office: N/A   Symptoms  Reason For Call & Symptoms: Onset 09/29/2012 sore throat, then chest congestion, non-productive cough.  Patient states that her "skin breaks out" each time she gets sick  Reviewed Health History In EMR: Yes  Reviewed Medications In EMR: Yes  Reviewed Allergies In EMR: Yes  Reviewed Surgeries / Procedures: Yes  Date of Onset of Symptoms: 09/29/2012  Treatments Tried: Ibuprofen, Mucinex, Delsym (all dosages as directed on bottles)  Treatments Tried Worked: No  Guideline(s) Used:  Cough  Disposition Per Guideline:   Home Care  Reason For Disposition Reached:   Cough with cold symptoms (e.g., runny nose, postnasal drip, throat clearing)  Advice Given:  Reassurance  Coughing is the way that our lungs remove irritants and mucus. It helps protect our lungs from getting pneumonia.  You can get a dry hacking cough after a chest cold. Sometimes this type of cough can last 1-3 weeks, and be worse at night.  You can also get a cough after being exposed to irritating substances like smoke, strong perfumes, and dust.  Here is some care advice that should help.  Cough Medicines:  OTC Cough Syrups: The most common cough suppressant in OTC cough medications is dextromethorphan. Often the letters "DM" appear in the name.  OTC Cough Drops: Cough drops can help a lot, especially for mild coughs. They reduce coughing by soothing your irritated throat and removing that tickle sensation in the back of the throat. Cough drops also have the advantage of portability - you can carry them with you.  Home Remedy - Hard Candy: Hard candy works just as well as medicine-flavored OTC cough drops. Diabetics should  use sugar-free candy.  Home Remedy - Honey: This old home remedy has been shown to help decrease coughing at night. The adult dosage is 2 teaspoons (10 ml) at bedtime. Honey should not be given to infants under one year of age.  OTC Cough Syrup - Dextromethorphan:  Cough syrups containing the cough suppressant dextromethorphan (DM) may help decrease your cough. Cough syrups work best for coughs that keep you awake at night. They can also sometimes help in the late stages of a respiratory infection when the cough is dry and hacking. They can be used along with cough drops.  Examples: Benylin, Robitussin DM, Vicks 44 Cough Relief  Read the package instructions for dosage, contraindications, and other important information.  Caution - Dextromethorphan:   Do not try to completely suppress coughs that produce mucus and phlegm. Remember that coughing is helpful in bringing up mucus from the lungs and preventing pneumonia.  Research Notes: Dextromethorphan in some research studies has been shown to reduce the frequency and severity of cough in adults (18 years or older) without significant adverse effects. However, other studies suggest that dextromethorphan is no better than placebo at reducing a cough.  Drug Abuse Potential: It should be noted that dextromethorphan has become a drug of abuse. This problem is seen most often in adolescents. Overdose symptoms can range from giggling and euphoria to hallucinations and coma.  CONTRAINDICATED: Do not take dextromethorphan if you are taking a monoamine oxidase (MAO) inhibitor now or in the past 2 weeks. Examples of MAO inhibitors include  isocarboxazid (Marplan), phenelzine (Nardil), selegiline (Eldepryl, Emsam, Zelapar), and tranylcypromine (Parnate). Do not take dextromethorphan if you are taking venlafaxine (Effexor).  Coughing Spasms:  Drink warm fluids. Inhale warm mist (Reason: both relax the airway and loosen up the phlegm).  Suck on cough drops or hard  candy to coat the irritated throat.  Prevent Dehydration:  Drink adequate liquids.  This will help soothe an irritated or dry throat and loosen up the phlegm.  Avoid Tobacco Smoke:  Smoking or being exposed to smoke makes coughs much worse.  Expected Course:   The expected course depends on what is causing the cough.  Viral bronchitis (chest cold) causes a cough that lasts 1 to 3 weeks. Sometimes you may cough up lots of phlegm (sputum, mucus). The mucus can normally be white, gray, yellow, or green.  Call Back If:  Difficulty breathing  Cough lasts more than 3 weeks  Fever lasts > 3 days  You become worse.  Patient Will Follow Care Advice:  YES

## 2012-10-05 NOTE — Telephone Encounter (Signed)
Noted  

## 2012-10-10 ENCOUNTER — Ambulatory Visit (INDEPENDENT_AMBULATORY_CARE_PROVIDER_SITE_OTHER): Payer: BC Managed Care – PPO | Admitting: Family Medicine

## 2012-10-10 ENCOUNTER — Encounter: Payer: Self-pay | Admitting: Family Medicine

## 2012-10-10 VITALS — BP 132/78 | HR 84 | Temp 98.6°F | Wt 229.0 lb

## 2012-10-10 DIAGNOSIS — J019 Acute sinusitis, unspecified: Secondary | ICD-10-CM

## 2012-10-10 MED ORDER — CLARITHROMYCIN ER 500 MG PO TB24: 1000.0000 mg | ORAL_TABLET | Freq: Every day | ORAL | Status: DC

## 2012-10-10 NOTE — Patient Instructions (Addendum)
Continue current regimen. Add nasal saline irrigation.  Start antibiotics.  Call if not improving as expected.

## 2012-10-10 NOTE — Progress Notes (Signed)
  Subjective:    Patient ID: [name], female    DOB: 1951-03-17, 62 y.o.   MRN: [id number]  HPI  62 year old female with 2 weeks of ST, nasal congestion, productive cough, sneezing, right ear pain, B sinus face pain, headache. Nasal discharge has change in last 24 hrs from white to yellow. No fever, no SOB, no wheeze.  Was treat for sinus infection in February...symptoms resolved completely.  Has tried ibuprofen, claritin D, astelin, nasonex, mucinex, delsym. PMH: mild intermitant, asthma, nonsmoker    Review of Systems  Constitutional: Negative for fever and fatigue.  HENT: Negative for ear pain.   Eyes: Negative for pain.  Respiratory: Negative for chest tightness and shortness of breath.   Cardiovascular: Negative for chest pain, palpitations and leg swelling.  Gastrointestinal: Negative for abdominal pain.  Genitourinary: Negative for dysuria.       Objective:   Physical Exam  Constitutional: Vital signs are normal. She appears well-developed and well-nourished. She is cooperative.  Non-toxic appearance. She does not appear ill. No distress.  HENT:  Head: Normocephalic.  Right Ear: Hearing, tympanic membrane, external ear and ear canal normal. Tympanic membrane is not erythematous, not retracted and not bulging.  Left Ear: Hearing, tympanic membrane, external ear and ear canal normal. Tympanic membrane is not erythematous, not retracted and not bulging.  Nose: Mucosal edema and rhinorrhea present. Right sinus exhibits maxillary sinus tenderness. Right sinus exhibits no frontal sinus tenderness. Left sinus exhibits maxillary sinus tenderness. Left sinus exhibits no frontal sinus tenderness.  Mouth/Throat: Uvula is midline, oropharynx is clear and moist and mucous membranes are normal.  Eyes: Conjunctivae, EOM and lids are normal. Pupils are equal, round, and reactive to light. No foreign bodies found.  Neck: Trachea normal and normal range of motion. Neck supple.  Carotid bruit is not present. No mass and no thyromegaly present.  Cardiovascular: Normal rate, regular rhythm, S1 normal, S2 normal, normal heart sounds, intact distal pulses and normal pulses.  Exam reveals no gallop and no friction rub.   No murmur heard. Pulmonary/Chest: Effort normal and breath sounds normal. Not tachypneic. No respiratory distress. She has no decreased breath sounds. She has no wheezes. She has no rhonchi. She has no rales.  Neurological: She is alert.  Skin: Skin is warm, dry and intact. No rash noted.  Psychiatric: Her speech is normal and behavior is normal. Judgment normal. Her mood appears not anxious. Cognition and memory are normal. She does not exhibit a depressed mood.          Assessment & Plan:

## 2012-10-10 NOTE — Assessment & Plan Note (Signed)
>   7-10 days... Change to purulent discharge.  Treat with antibitoics x 10 days... Clarithromycin as allergic to PCN.

## 2012-10-12 ENCOUNTER — Telehealth: Payer: Self-pay | Admitting: Internal Medicine

## 2012-10-12 NOTE — Telephone Encounter (Signed)
[facility] Triage Call Report Triage Record Num: [id number] Operator: Patriciaann Clan Patient Name: [name] Call Date & Time: 10/09/2012 3:30:59PM Patient Phone: [phone] PCP: Darryll Capers Patient Gender: Female PCP Fax : [phone] Patient DOB: 08-11-1950 Practice Name: [facility] Reason for Call: Caller: [facility]/Patient; PCP: Darryll Capers (Adults only); CB#: [phone]; Call regarding possible Sinus infection Patient states she developed cough, chest, head and nasal congestion. Onset 09/29/12. States she has fullness and pressure in head and face. Patient states she initially had a sore throat that resolved 10/03/12. Afebrile. Patient states she has yellow nasal drainage. Patient states she has tried Mucinex and Delsym without relief. Patient states she is coughing up yellow sputum. Denies wheezing. Triage per Upper Respiratory Infection Protocol. No emergent sx identified. Disposition of " See Provider within 24 hours" obtained related to positive triage assessment for " Pressure/pain above or below eyes, over sinuses and yellow-green drainage from nose or down back of throat." Care advice given per guidelines. Patient advised increased fluids, saline nasal washes/neti pot, inhaled steam, humidifier, warm fluids with honey, warm compresses to face. Call back parameters reviewed. Patient verbalizes understanding. Appt. scheduled for 10/10/12 1030 at [facility] office with Dr. Kerby Nora. Patient informed that office visit is at [facility] office. Patient verbalizes understanding. Protocol(s) Used: Upper Respiratory Infection (URI) Recommended Outcome per Protocol: See Provider within 24 hours Reason for Outcome: Pressure/pain above or below eyes, near ears over sinuses (may also be described as fullness, worsens when bending forward, teeth or eye pain) AND yellow-green drainage from nose or down back of throat. Care Advice: ~ Use a cool mist humidifier to moisten  air. Be sure to clean according to manufacturer's instructions. ~ Consider use of a saline nasal spray per package directions to help relieve nasal congestion. Drink more fluids -- water, low-sugar juices, tea and warm soup, especially chicken broth, are options. Avoid caffeinated or alcoholic beverages because they can increase the chance of dehydration. ~ ~ SYMPTOM / CONDITION MANAGEMENT A warm, moist compress placed on face, over eyes for 15 to 20 minutes, 5 to 6 times a day, may help relieve the congestion. ~ Analgesic/Antipyretic Advice - Acetaminophen: Consider acetaminophen as directed on label or by pharmacist/provider for pain or fever PRECAUTIONS: - Use if there is no history of liver disease, alcoholism, or intake of three or more alcohol drinks per day - Only if approved by provider during pregnancy or when breastfeeding - During pregnancy, acetaminophen should not be taken more than 3 consecutive days without telling provider - Do not exceed recommended dose or frequency ~ 10/09/2012 4:03:33PM Page 1 of 2 [id number] [facility] Triage Call Report Patient Name: [name] continuation page/s Analgesic/Antipyretic Advice - NSAIDs: Consider aspirin, ibuprofen, naproxen or ketoprofen for pain or fever as directed on label or by pharmacist/provider. PRECAUTIONS: - If over 48 years of age, should not take longer than 1 week without consulting provider. EXCEPTIONS: - Should not be used if taking blood thinners or have bleeding problems. - Do not use if have history of sensitivity/allergy to any of these medications; or history of cardiovascular, ulcer, kidney, liver disease or diabetes unless approved by provider. - Do not exceed recommended dose or frequency. ~ Nasal Irrigation To Relieve Congestion: - Wash hands with soap and water. - Add 1/2 teaspoon salt to 1 cup warm water to make saltwater solution. - Use a bulb syringe to draw up the saltwater solution.  Turn the bulb upright to squeeze out any air. Fill the syringe  until is full. - Bend over sink bowl and lean slightly toward sink. Gently insert the tip of the syringe into nostril about 1/2 inch. Point tip toward outer corner of eye and GENTLY squeeze bulb to squirt saltwater into nostril. Forceful irrigation to clear sinuses requires the use of sterile water or saline. - Let solution drain from nose. Solution may come out of the other nostril or even your mouth. Do two full syringes of solution in each nostril. - Rinse syringe in clean water several times. Be sure water comes out clear. Dry the bulb syringe and store in a container or plastic bag. ~ 10/09/2012 4:03:33PM Page 2 of 2 CAN_TriageRpt

## 2012-11-02 ENCOUNTER — Telehealth: Payer: Self-pay | Admitting: Internal Medicine

## 2012-11-02 MED ORDER — PHENTERMINE HCL 37.5 MG PO CAPS: 37.5000 mg | ORAL_CAPSULE | ORAL | Status: DC

## 2012-11-02 NOTE — Telephone Encounter (Signed)
Pt would like refill of an appetite suppressant than MD put her on a few years ago. (could not locate on med list.)  Pt has cpe on June 25, but states she would like to start prior to her appt.Pls advise. Pharm: [facility] [address]

## 2012-11-02 NOTE — Telephone Encounter (Signed)
Per dr Lovell Sheehan- may have phentermine 37.5-pt informed and med sent in

## 2012-12-09 ENCOUNTER — Other Ambulatory Visit: Payer: Self-pay | Admitting: Internal Medicine

## 2012-12-09 ENCOUNTER — Other Ambulatory Visit (INDEPENDENT_AMBULATORY_CARE_PROVIDER_SITE_OTHER): Payer: BC Managed Care – PPO

## 2012-12-09 DIAGNOSIS — Z0000 Encounter for general adult medical examination without abnormal findings: Secondary | ICD-10-CM

## 2012-12-09 LAB — POCT URINALYSIS DIPSTICK
Bilirubin, UA: NEGATIVE
Blood, UA: NEGATIVE
Glucose, UA: NEGATIVE
Ketones, UA: NEGATIVE
Leukocytes, UA: NEGATIVE
Nitrite, UA: NEGATIVE
Protein, UA: NEGATIVE
Spec Grav, UA: 1.01
Urobilinogen, UA: 0.2
pH, UA: 6.5

## 2012-12-09 LAB — BASIC METABOLIC PANEL
BUN: 16 mg/dL (ref 6–23)
CO2: 27 mEq/L (ref 19–32)
Calcium: 8.5 mg/dL (ref 8.4–10.5)
Chloride: 94 mEq/L — ABNORMAL LOW (ref 96–112)
Creatinine, Ser: 0.6 mg/dL (ref 0.4–1.2)
GFR: 116.57 mL/min (ref >60.00)
Glucose, Bld: 105 mg/dL — ABNORMAL HIGH (ref 70–99)
Potassium: 4.4 mEq/L (ref 3.5–5.1)
Sodium: 133 mEq/L — ABNORMAL LOW (ref 135–145)

## 2012-12-09 LAB — HEPATIC FUNCTION PANEL
ALT: 15 U/L (ref 0–35)
AST: 18 U/L (ref 0–37)
Albumin: 3.9 g/dL (ref 3.5–5.2)
Alkaline Phosphatase: 145 U/L — ABNORMAL HIGH (ref 39–117)
Bilirubin, Direct: 0.1 mg/dL (ref 0.0–0.3)
Total Bilirubin: 0.6 mg/dL (ref 0.3–1.2)
Total Protein: 7.2 g/dL (ref 6.0–8.3)

## 2012-12-09 LAB — CBC WITH DIFFERENTIAL/PLATELET
Basophils Absolute: 0.1 10*3/uL (ref 0.0–0.1)
Basophils Relative: 0.7 % (ref 0.0–3.0)
Eosinophils Absolute: 0.1 10*3/uL (ref 0.0–0.7)
Eosinophils Relative: 1.8 % (ref 0.0–5.0)
HCT: 39.9 % (ref 36.0–46.0)
Hemoglobin: 13.2 g/dL (ref 12.0–15.0)
Lymphocytes Relative: 36.1 % (ref 12.0–46.0)
Lymphs Abs: 2.7 10*3/uL (ref 0.7–4.0)
MCHC: 33 g/dL (ref 30.0–36.0)
MCV: 90.7 fl (ref 78.0–100.0)
Monocytes Absolute: 0.6 10*3/uL (ref 0.1–1.0)
Monocytes Relative: 8.1 % (ref 3.0–12.0)
Neutro Abs: 4 10*3/uL (ref 1.4–7.7)
Neutrophils Relative %: 53.3 % (ref 43.0–77.0)
Platelets: 356 10*3/uL (ref 150.0–400.0)
RBC: 4.4 Mil/uL (ref 3.87–5.11)
RDW: 13.4 % (ref 11.5–14.6)
WBC: 7.5 10*3/uL (ref 4.5–10.5)

## 2012-12-09 LAB — LIPID PANEL
Cholesterol: 180 mg/dL (ref 0–200)
HDL: 82.2 mg/dL (ref >39.00)
LDL Cholesterol: 79 mg/dL (ref 0–99)
Total CHOL/HDL Ratio: 2
Triglycerides: 96 mg/dL (ref 0.0–149.0)
VLDL: 19.2 mg/dL (ref 0.0–40.0)

## 2012-12-09 LAB — TSH: TSH: 3.38 u[IU]/mL (ref 0.35–5.50)

## 2012-12-10 ENCOUNTER — Other Ambulatory Visit: Payer: Self-pay | Admitting: Internal Medicine

## 2012-12-14 ENCOUNTER — Encounter: Payer: BC Managed Care – PPO | Admitting: Internal Medicine

## 2012-12-16 ENCOUNTER — Encounter: Payer: Self-pay | Admitting: Internal Medicine

## 2012-12-16 ENCOUNTER — Ambulatory Visit (INDEPENDENT_AMBULATORY_CARE_PROVIDER_SITE_OTHER): Payer: BC Managed Care – PPO | Admitting: Internal Medicine

## 2012-12-16 VITALS — BP 134/76 | HR 72 | Temp 98.2°F | Resp 16 | Ht 67.0 in | Wt 224.0 lb

## 2012-12-16 DIAGNOSIS — Z0000 Encounter for general adult medical examination without abnormal findings: Secondary | ICD-10-CM

## 2012-12-16 NOTE — Patient Instructions (Addendum)
Being gluten free No cow milk , no beans, no bread pasta  Meat   Beef, pork, fish and chicken [place] [employer] with "color veggie"    Carrots, sweet potato, butternut squash, beets...... Nuts Fruit   Goat and sheep cheeses and yogurt

## 2012-12-16 NOTE — Progress Notes (Signed)
Subjective:    Patient ID: [name], female    DOB: [birth year], 62 y.o.   MRN: [id number]  HPI  Cracker her patella after TKR and is now immobilized CPX Weight "gluten free" reviewed labs   Review of Systems  Constitutional: Positive for activity change and unexpected weight change. Negative for appetite change and fatigue.  HENT: Negative for ear pain, congestion, neck pain, postnasal drip and sinus pressure.   Eyes: Negative for redness and visual disturbance.  Respiratory: Negative for cough, shortness of breath and wheezing.   Gastrointestinal: Positive for constipation. Negative for abdominal pain and abdominal distention.  Genitourinary: Negative for dysuria, frequency and menstrual problem.  Musculoskeletal: Positive for joint swelling and gait problem. Negative for myalgias and arthralgias.  Skin: Negative for rash and wound.  Neurological: Negative for dizziness, weakness and headaches.  Hematological: Negative for adenopathy. Does not bruise/bleed easily.  Psychiatric/Behavioral: Negative for sleep disturbance and decreased concentration.   Past Medical History  Diagnosis Date  . Allergy   . Hyperlipidemia   . IBS (irritable bowel syndrome)   . Lumbar disc disease   . Colon polyps   . Anxiety   . Asthma     /w resp. upset   . GERD (gastroesophageal reflux disease)   . Headache(784.0)     migraines -h/o  . Arthritis     back & knee    History   Social History  . Marital Status: Married    Spouse Name: N/A    Number of Children: 1  . Years of Education: N/A   Occupational History  . Retired    Social History Main Topics  . Smoking status: Never Smoker   . Smokeless tobacco: Never Used  . Alcohol Use: No  . Drug Use: No  . Sexually Active: Yes   Other Topics Concern  . Not on file   Social History Narrative  . No narrative on file    Past Surgical History  Procedure Laterality Date  . Abdominal hysterectomy    . Oophorectomy     . Carpal tunnel release      bilateral   . Esophagogastroduodenoscopy    . Rotator cuff repair      left   . Lasik    . Breast surgery      augmetation, implants- 1980  . Cholecystectomy      laparoscopic   . Bladder suspension      2007  . Total knee arthroplasty  04/15/2012    RIGHT KNEE  . Knee arthroscopy  04/15/2012    Procedure: ARTHROSCOPY KNEE;  Surgeon: Loreta Ave, MD;  Location: [facility] OR;  Service: Orthopedics;  Laterality: Right;  . Total knee arthroplasty  04/15/2012    Procedure: TOTAL KNEE ARTHROPLASTY;  Surgeon: Loreta Ave, MD;  Location: [facility] OR;  Service: Orthopedics;  Laterality: Right;    Family History  Problem Relation Age of Onset  . Diabetes Mother   . Heart disease Father   . Colon polyps Mother   . Colon polyps Maternal Aunt   . Irritable bowel syndrome Mother     Allergies  Allergen Reactions  . Penicillins     REACTION: rash    Current Outpatient Prescriptions on File Prior to Visit  Medication Sig Dispense Refill  . azelastine (ASTELIN) 137 MCG/SPRAY nasal spray Place 1 spray into the nose 2 (two) times daily.       . calcium-vitamin D (OSCAL WITH D) 500-200 MG-UNIT per tablet Take 1  tablet by mouth daily.      . DULoxetine (CYMBALTA) 30 MG capsule TAKE 3 CAPSULES (90 MG TOTAL) BY MOUTH DAILY.  90 capsule  7  . estradiol (ESTRACE) 0.5 MG tablet Take 0.5 mg by mouth daily.      [name] KLS ALLERCLEAR D-24HR 10-240 MG per 24 hr tablet TAKE 1 TABLET BY MOUTH ONCE DAILY  30 tablet  11  . metoCLOPramide (REGLAN) 10 MG tablet TAKE 1/2 TABLET (5 MG TOTAL) BY MOUTH 4 (FOUR) TIMES DAILY.  60 tablet  5  . nabumetone (RELAFEN) 750 MG tablet TAKE 2 TABLETS EVERY DAY  60 tablet  4  . NASONEX 50 MCG/ACT nasal spray Place 2 sprays into the nose daily.       . phentermine 37.5 MG capsule Take 1 capsule (37.5 mg total) by mouth every morning.  30 capsule  2  . PRILOSEC OTC 20 MG tablet TAKE 1 TABLET EVERY DAY  42 tablet  3  . psyllium (METAMUCIL SMOOTH  TEXTURE) 28 % packet Take 1 packet by mouth 2 (two) times daily.       . rosuvastatin (CRESTOR) 20 MG tablet Take 20 mg by mouth. Take 1 tab twice weekly on Tuesday and Friday      . SUMAtriptan (IMITREX) 100 MG tablet Take 1 tablet (100 mg total) by mouth every 2 (two) hours as needed for migraine.  10 tablet  0   No current facility-administered medications on file prior to visit.    BP 134/76  Pulse 72  Temp(Src) 98.2 F (36.8 C)  Resp 16  Ht 5\' 7"  (1.702 m)  Wt 224 lb (101.606 kg)  BMI 35.08 kg/m2       Objective:   Physical Exam  Nursing note and vitals reviewed. Constitutional: She is oriented to person, place, and time. She appears well-developed and well-nourished. No distress.  HENT:  Head: Normocephalic and atraumatic.  Right Ear: External ear normal.  Left Ear: External ear normal.  Nose: Nose normal.  Mouth/Throat: Oropharynx is clear and moist.  Eyes: Conjunctivae and EOM are normal. Pupils are equal, round, and reactive to light.  Neck: Normal range of motion. Neck supple. No JVD present. No tracheal deviation present. No thyromegaly present.  Cardiovascular: Normal rate, regular rhythm and intact distal pulses.   Murmur heard. Pulmonary/Chest: Effort normal and breath sounds normal. She has no wheezes. She exhibits no tenderness.  Abdominal: Soft. Bowel sounds are normal.  Musculoskeletal: She exhibits edema and tenderness.  Right leg in immobilizer   Lymphadenopathy:    She has no cervical adenopathy.  Neurological: She is alert and oriented to person, place, and time. She has normal reflexes. No cranial nerve deficit.  Skin: Skin is warm and dry. She is not diaphoretic.  Psychiatric: She has a normal mood and affect. Her behavior is normal.          Assessment & Plan:   This is a routine physical examination for this healthy  Female. Reviewed all health maintenance protocols including mammography colonoscopy bone density and reviewed appropriate  screening labs. Her immunization history was reviewed as well as her current medications and allergies refills of her chronic medications were given and the plan for yearly health maintenance was discussed all orders and referrals were made as appropriate. .weigth management Monitoring alk phos elevation in setting of knee surgery

## 2013-01-03 ENCOUNTER — Other Ambulatory Visit: Payer: Self-pay | Admitting: Internal Medicine

## 2013-01-07 ENCOUNTER — Telehealth: Payer: Self-pay | Admitting: Internal Medicine

## 2013-01-07 NOTE — Telephone Encounter (Signed)
Pt's phentermine was denied. She will have to pay cash. Thank you.

## 2013-01-07 NOTE — Telephone Encounter (Signed)
Pt is aware and will pay cash for it

## 2013-02-11 ENCOUNTER — Ambulatory Visit: Payer: BC Managed Care – PPO | Attending: Orthopedic Surgery | Admitting: Rehabilitation

## 2013-02-11 DIAGNOSIS — M25669 Stiffness of unspecified knee, not elsewhere classified: Secondary | ICD-10-CM | POA: Insufficient documentation

## 2013-02-11 DIAGNOSIS — S8290XD Unspecified fracture of unspecified lower leg, subsequent encounter for closed fracture with routine healing: Secondary | ICD-10-CM | POA: Insufficient documentation

## 2013-02-11 DIAGNOSIS — IMO0001 Reserved for inherently not codable concepts without codable children: Secondary | ICD-10-CM | POA: Insufficient documentation

## 2013-02-11 DIAGNOSIS — Z96659 Presence of unspecified artificial knee joint: Secondary | ICD-10-CM | POA: Insufficient documentation

## 2013-02-11 DIAGNOSIS — M25569 Pain in unspecified knee: Secondary | ICD-10-CM | POA: Insufficient documentation

## 2013-02-16 ENCOUNTER — Ambulatory Visit: Payer: BC Managed Care – PPO | Admitting: Physical Therapy

## 2013-02-18 ENCOUNTER — Ambulatory Visit: Payer: BC Managed Care – PPO | Admitting: Physical Therapy

## 2013-03-02 ENCOUNTER — Ambulatory Visit: Payer: BC Managed Care – PPO | Attending: Orthopedic Surgery | Admitting: Physical Therapy

## 2013-03-02 DIAGNOSIS — S8290XD Unspecified fracture of unspecified lower leg, subsequent encounter for closed fracture with routine healing: Secondary | ICD-10-CM | POA: Insufficient documentation

## 2013-03-02 DIAGNOSIS — IMO0001 Reserved for inherently not codable concepts without codable children: Secondary | ICD-10-CM | POA: Insufficient documentation

## 2013-03-02 DIAGNOSIS — M25669 Stiffness of unspecified knee, not elsewhere classified: Secondary | ICD-10-CM | POA: Insufficient documentation

## 2013-03-02 DIAGNOSIS — Z96659 Presence of unspecified artificial knee joint: Secondary | ICD-10-CM | POA: Insufficient documentation

## 2013-03-02 DIAGNOSIS — M25569 Pain in unspecified knee: Secondary | ICD-10-CM | POA: Insufficient documentation

## 2013-03-04 ENCOUNTER — Ambulatory Visit: Payer: BC Managed Care – PPO | Admitting: Physical Therapy

## 2013-03-09 ENCOUNTER — Ambulatory Visit: Payer: BC Managed Care – PPO | Admitting: Physical Therapy

## 2013-03-11 ENCOUNTER — Ambulatory Visit: Payer: BC Managed Care – PPO | Admitting: Physical Therapy

## 2013-04-03 ENCOUNTER — Other Ambulatory Visit: Payer: Self-pay | Admitting: Internal Medicine

## 2013-04-19 ENCOUNTER — Encounter: Payer: Self-pay | Admitting: Internal Medicine

## 2013-04-19 ENCOUNTER — Ambulatory Visit (INDEPENDENT_AMBULATORY_CARE_PROVIDER_SITE_OTHER): Payer: BC Managed Care – PPO | Admitting: Internal Medicine

## 2013-04-19 VITALS — BP 130/80 | HR 76 | Temp 98.2°F | Resp 16 | Ht 67.0 in | Wt 230.0 lb

## 2013-04-19 DIAGNOSIS — M129 Arthropathy, unspecified: Secondary | ICD-10-CM

## 2013-04-19 DIAGNOSIS — M1990 Unspecified osteoarthritis, unspecified site: Secondary | ICD-10-CM

## 2013-04-19 MED ORDER — VITAMIN D 50 MCG (2000 UT) PO CAPS: 1.0000 | ORAL_CAPSULE | Freq: Every day | ORAL | Status: DC

## 2013-04-19 MED ORDER — NABUMETONE 750 MG PO TABS: ORAL_TABLET | ORAL | Status: DC

## 2013-04-19 NOTE — Patient Instructions (Signed)
The patient is instructed to continue all medications as prescribed. Schedule followup with check out clerk upon leaving the clinic  

## 2013-04-19 NOTE — Progress Notes (Signed)
  Subjective:    Patient ID: [name], female    DOB: Mar 20, 1951, 61 y.o.   MRN: [id number]  HPI Knee replacement and she has had a problem with the bone healing and has not been able to exercise Weight is increased Had been to allergies and was treated for  H-flu Has a persistent cough      Review of Systems  Constitutional: Negative for activity change, appetite change and fatigue.  HENT: Negative for congestion, ear pain, postnasal drip and sinus pressure.   Eyes: Negative for redness and visual disturbance.  Respiratory: Negative for cough, shortness of breath and wheezing.   Gastrointestinal: Negative for abdominal pain and abdominal distention.  Genitourinary: Negative for dysuria, frequency and menstrual problem.  Musculoskeletal: Negative for arthralgias, joint swelling, myalgias and neck pain.  Skin: Negative for rash and wound.  Neurological: Negative for dizziness, weakness and headaches.  Hematological: Negative for adenopathy. Does not bruise/bleed easily.  Psychiatric/Behavioral: Negative for sleep disturbance and decreased concentration.       Objective:   Physical Exam  Nursing note reviewed. Constitutional: She is oriented to person, place, and time. She appears well-developed and well-nourished. No distress.  HENT:  Head: Normocephalic and atraumatic.  Right Ear: External ear normal.  Left Ear: External ear normal.  Nose: Nose normal.  Mouth/Throat: Oropharynx is clear and moist.  Eyes: Conjunctivae and EOM are normal. Pupils are equal, round, and reactive to light.  Neck: Normal range of motion. Neck supple. No JVD present. No tracheal deviation present. No thyromegaly present.  Cardiovascular: Normal rate, regular rhythm, normal heart sounds and intact distal pulses.   No murmur heard. Pulmonary/Chest: Effort normal and breath sounds normal. She has no wheezes. She has no rales. She exhibits no tenderness.  clear  Abdominal: Soft. Bowel sounds  are normal.  Musculoskeletal: Normal range of motion. She exhibits no edema and no tenderness.  Lymphadenopathy:    She has no cervical adenopathy.  Neurological: She is alert and oriented to person, place, and time. She has normal reflexes. No cranial nerve deficit.  Skin: Skin is warm and dry. She is not diaphoretic.  Psychiatric: She has a normal mood and affect. Her behavior is normal.          Assessment & Plan:  Slow healing from TKR Add vit D Asthma stable Recovered from walking pneumonia

## 2013-06-08 ENCOUNTER — Other Ambulatory Visit: Payer: Self-pay | Admitting: Internal Medicine

## 2013-06-20 ENCOUNTER — Other Ambulatory Visit: Payer: Self-pay | Admitting: Internal Medicine

## 2013-08-02 ENCOUNTER — Other Ambulatory Visit: Payer: Self-pay | Admitting: Internal Medicine

## 2013-08-20 ENCOUNTER — Ambulatory Visit (INDEPENDENT_AMBULATORY_CARE_PROVIDER_SITE_OTHER): Payer: BC Managed Care – PPO | Admitting: Internal Medicine

## 2013-08-20 ENCOUNTER — Encounter: Payer: Self-pay | Admitting: Internal Medicine

## 2013-08-20 VITALS — BP 136/80 | HR 80 | Temp 97.8°F | Resp 16 | Ht 67.0 in | Wt 232.0 lb

## 2013-08-20 DIAGNOSIS — R21 Rash and other nonspecific skin eruption: Secondary | ICD-10-CM

## 2013-08-20 DIAGNOSIS — B9689 Other specified bacterial agents as the cause of diseases classified elsewhere: Secondary | ICD-10-CM

## 2013-08-20 DIAGNOSIS — J019 Acute sinusitis, unspecified: Secondary | ICD-10-CM

## 2013-08-20 MED ORDER — LEVOFLOXACIN 500 MG PO TABS: 500.0000 mg | ORAL_TABLET | Freq: Every day | ORAL | Status: DC

## 2013-08-20 MED ORDER — FLUCONAZOLE 100 MG PO TABS: 100.0000 mg | ORAL_TABLET | Freq: Every day | ORAL | Status: DC

## 2013-08-20 NOTE — Progress Notes (Signed)
Subjective:    Patient ID: [name], female    DOB: 04-Sep-1950, 63 y.o.   MRN: [id number]  Hyperlipidemia Pertinent negatives include no myalgias or shortness of breath.  Sinusitis Pertinent negatives include no congestion, coughing, ear pain, headaches, neck pain, shortness of breath or sinus pressure.  Asthma There is no cough, shortness of breath or wheezing. Pertinent negatives include no appetite change, ear pain, headaches, myalgias or postnasal drip. Her past medical history is significant for asthma.  chronic sinus pain and discharge Knee cap is still non union for 9 months!      Review of Systems  Constitutional: Negative for activity change, appetite change and fatigue.  HENT: Negative for congestion, ear pain, postnasal drip and sinus pressure.   Eyes: Negative for redness and visual disturbance.  Respiratory: Negative for cough, shortness of breath and wheezing.   Gastrointestinal: Negative for abdominal pain and abdominal distention.  Genitourinary: Negative for dysuria, frequency and menstrual problem.  Musculoskeletal: Positive for joint swelling. Negative for arthralgias, myalgias and neck pain.  Skin: Negative for rash and wound.  Neurological: Negative for dizziness, weakness and headaches.  Hematological: Negative for adenopathy. Does not bruise/bleed easily.  Psychiatric/Behavioral: Negative for sleep disturbance and decreased concentration.   Past Medical History  Diagnosis Date  . Allergy   . Hyperlipidemia   . IBS (irritable bowel syndrome)   . Lumbar disc disease   . Colon polyps   . Anxiety   . Asthma     /w resp. upset   . GERD (gastroesophageal reflux disease)   . Headache(784.0)     migraines -h/o  . Arthritis     back & knee    History   Social History  . Marital Status: Married    Spouse Name: N/A    Number of Children: 1  . Years of Education: N/A   Occupational History  . Retired    Social History Main Topics  .  Smoking status: Never Smoker   . Smokeless tobacco: Never Used  . Alcohol Use: No  . Drug Use: No  . Sexual Activity: Yes   Other Topics Concern  . Not on file   Social History Narrative  . No narrative on file    Past Surgical History  Procedure Laterality Date  . Abdominal hysterectomy    . Oophorectomy    . Carpal tunnel release      bilateral   . Esophagogastroduodenoscopy    . Rotator cuff repair      left   . Lasik    . Breast surgery      augmetation, implants- 1980  . Cholecystectomy      laparoscopic   . Bladder suspension      2007  . Total knee arthroplasty  04/15/2012    RIGHT KNEE  . Knee arthroscopy  04/15/2012    Procedure: ARTHROSCOPY KNEE;  Surgeon: [name], MD;  Location: [facility];  Service: Orthopedics;  Laterality: Right;  . Total knee arthroplasty  04/15/2012    Procedure: TOTAL KNEE ARTHROPLASTY;  Surgeon: [name], MD;  Location: [facility];  Service: Orthopedics;  Laterality: Right;    Family History  Problem Relation Age of Onset  . Diabetes Mother   . Heart disease Father   . Colon polyps Mother   . Colon polyps Maternal Aunt   . Irritable bowel syndrome Mother     Allergies  Allergen Reactions  . Penicillins     REACTION: rash  Current Outpatient Prescriptions on File Prior to Visit  Medication Sig Dispense Refill  . azelastine (ASTELIN) 137 MCG/SPRAY nasal spray Place 1 spray into the nose 2 (two) times daily.       . calcium-vitamin D (OSCAL WITH D) 500-200 MG-UNIT per tablet Take 1 tablet by mouth daily.      . Cholecalciferol (VITAMIN D) 2000 UNITS CAPS Take 1 capsule (2,000 Units total) by mouth daily.  30 capsule    . [facility] OMEPRAZOLE 20 MG TBEC TAKE 1 TABLET EVERY DAY  42 tablet  6  . DULoxetine (CYMBALTA) 30 MG capsule TAKE 3 CAPSULES (90 MG TOTAL) BY MOUTH DAILY.  90 capsule  10  . estradiol (ESTRACE) 0.5 MG tablet Take 0.5 mg by mouth daily.      [name] KLS ALLERCLEAR D-24HR 10-240 MG per 24 hr tablet TAKE 1 TABLET BY  MOUTH ONCE DAILY  30 tablet  11  . metoCLOPramide (REGLAN) 10 MG tablet TAKE 1/2 TABLET (5 MG TOTAL) BY MOUTH 4 (FOUR) TIMES DAILY.  60 tablet  5  . nabumetone (RELAFEN) 750 MG tablet TAKE 2 TABLETS EVERY DAY  60 tablet  4  . NASONEX 50 MCG/ACT nasal spray Place 2 sprays into the nose daily.       . psyllium (METAMUCIL SMOOTH TEXTURE) 28 % packet Take 1 packet by mouth 2 (two) times daily.       . rosuvastatin (CRESTOR) 20 MG tablet Take 20 mg by mouth. Take 1 tab twice weekly on Tuesday and Friday      . SUMAtriptan (IMITREX) 100 MG tablet TAKE 1 TABLET BY MOUTH EVERY 2 HOURS AS NEEDED FOR MIGRAINE  10 tablet  3   No current facility-administered medications on file prior to visit.    BP 136/80  Pulse 80  Temp(Src) 97.8 F (36.6 C)  Resp 16  Ht 5\' 7"  (1.702 m)  Wt 232 lb (105.235 kg)  BMI 36.33 kg/m2        Objective:   Physical Exam  Constitutional: She is oriented to person, place, and time. She appears well-developed and well-nourished. No distress.  weight  HENT:  Head: Normocephalic and atraumatic.  purulent  Eyes: Conjunctivae and EOM are normal. Pupils are equal, round, and reactive to light.  Neck: Normal range of motion. Neck supple. No JVD present. No tracheal deviation present. No thyromegaly present.  Cardiovascular: Normal rate, regular rhythm and intact distal pulses.   Murmur heard. Pulmonary/Chest: Effort normal and breath sounds normal. She has no wheezes. She exhibits no tenderness.  Abdominal: Soft. Bowel sounds are normal.  Musculoskeletal: She exhibits tenderness. She exhibits no edema.  Lymphadenopathy:    She has no cervical adenopathy.  Neurological: She is alert and oriented to person, place, and time. She has normal reflexes. No cranial nerve deficit.  Skin: Skin is warm and dry. She is not diaphoretic.  Psychiatric: She has a normal mood and affect. Her behavior is normal.          Assessment & Plan:  Had epidural 2/19 Has persistant non  union of knee cap Head ache and sinus congestion with bleeding  Sinus infection: bacterial levofloxin for 7 days  Has a  rash on trunk ? Id Trial of diflucan

## 2013-08-20 NOTE — Patient Instructions (Signed)
The patient is instructed to continue all medications as prescribed. Schedule followup with check out clerk upon leaving the clinic  

## 2013-08-20 NOTE — Progress Notes (Signed)
Pre visit review using our clinic review tool, if applicable. No additional management support is needed unless otherwise documented below in the visit note. 

## 2013-08-27 ENCOUNTER — Telehealth: Payer: Self-pay | Admitting: Internal Medicine

## 2013-08-27 DIAGNOSIS — R21 Rash and other nonspecific skin eruption: Secondary | ICD-10-CM

## 2013-08-27 NOTE — Telephone Encounter (Signed)
Pt states the rash on her body is there with itching. She would like to know the next plan of care you would recommend?? Please call and advise.

## 2013-08-30 NOTE — Telephone Encounter (Addendum)
Pt is going to call [facility] and schedule appt, referral is not needed

## 2013-08-30 NOTE — Telephone Encounter (Signed)
Refer to derm  ?

## 2013-08-30 NOTE — Addendum Note (Signed)
Addended by: Townsend Roger D on: 08/30/2013 04:56 PM   Modules accepted: Orders

## 2013-09-20 ENCOUNTER — Ambulatory Visit: Payer: BC Managed Care – PPO | Attending: Orthopedic Surgery | Admitting: Physical Therapy

## 2013-09-20 DIAGNOSIS — R262 Difficulty in walking, not elsewhere classified: Secondary | ICD-10-CM | POA: Insufficient documentation

## 2013-09-20 DIAGNOSIS — M25569 Pain in unspecified knee: Secondary | ICD-10-CM | POA: Insufficient documentation

## 2013-09-20 DIAGNOSIS — IMO0001 Reserved for inherently not codable concepts without codable children: Secondary | ICD-10-CM | POA: Insufficient documentation

## 2013-09-23 ENCOUNTER — Ambulatory Visit: Payer: BC Managed Care – PPO | Attending: Orthopedic Surgery | Admitting: Physical Therapy

## 2013-09-23 DIAGNOSIS — IMO0001 Reserved for inherently not codable concepts without codable children: Secondary | ICD-10-CM | POA: Insufficient documentation

## 2013-09-23 DIAGNOSIS — R262 Difficulty in walking, not elsewhere classified: Secondary | ICD-10-CM | POA: Insufficient documentation

## 2013-09-23 DIAGNOSIS — M25569 Pain in unspecified knee: Secondary | ICD-10-CM | POA: Insufficient documentation

## 2013-09-28 ENCOUNTER — Ambulatory Visit: Payer: BC Managed Care – PPO | Admitting: Physical Therapy

## 2013-09-28 DIAGNOSIS — IMO0001 Reserved for inherently not codable concepts without codable children: Secondary | ICD-10-CM | POA: Diagnosis not present

## 2013-09-30 ENCOUNTER — Ambulatory Visit: Payer: BC Managed Care – PPO | Admitting: Physical Therapy

## 2013-09-30 DIAGNOSIS — IMO0001 Reserved for inherently not codable concepts without codable children: Secondary | ICD-10-CM | POA: Diagnosis not present

## 2013-10-05 ENCOUNTER — Ambulatory Visit: Payer: BC Managed Care – PPO | Admitting: Physical Therapy

## 2013-10-05 DIAGNOSIS — IMO0001 Reserved for inherently not codable concepts without codable children: Secondary | ICD-10-CM | POA: Diagnosis not present

## 2013-10-07 ENCOUNTER — Ambulatory Visit: Payer: BC Managed Care – PPO | Admitting: Physical Therapy

## 2013-10-07 ENCOUNTER — Other Ambulatory Visit: Payer: Self-pay | Admitting: Dermatology

## 2013-10-07 DIAGNOSIS — IMO0001 Reserved for inherently not codable concepts without codable children: Secondary | ICD-10-CM | POA: Diagnosis not present

## 2013-10-12 ENCOUNTER — Ambulatory Visit: Payer: BC Managed Care – PPO | Admitting: Physical Therapy

## 2013-10-14 ENCOUNTER — Ambulatory Visit: Payer: BC Managed Care – PPO | Admitting: Physical Therapy

## 2013-10-14 DIAGNOSIS — IMO0001 Reserved for inherently not codable concepts without codable children: Secondary | ICD-10-CM | POA: Diagnosis not present

## 2013-10-21 ENCOUNTER — Ambulatory Visit: Payer: BC Managed Care – PPO | Admitting: Physical Therapy

## 2013-10-21 DIAGNOSIS — IMO0001 Reserved for inherently not codable concepts without codable children: Secondary | ICD-10-CM | POA: Diagnosis not present

## 2013-10-27 ENCOUNTER — Other Ambulatory Visit: Payer: Self-pay | Admitting: Dermatology

## 2013-11-29 ENCOUNTER — Ambulatory Visit (INDEPENDENT_AMBULATORY_CARE_PROVIDER_SITE_OTHER): Payer: BC Managed Care – PPO | Admitting: Family Medicine

## 2013-11-29 ENCOUNTER — Encounter: Payer: Self-pay | Admitting: Family Medicine

## 2013-11-29 VITALS — BP 132/80 | HR 92 | Temp 98.8°F | Wt 236.0 lb

## 2013-11-29 DIAGNOSIS — J019 Acute sinusitis, unspecified: Secondary | ICD-10-CM

## 2013-11-29 MED ORDER — AZITHROMYCIN 250 MG PO TABS: ORAL_TABLET | ORAL | Status: AC

## 2013-11-29 NOTE — Progress Notes (Signed)
Pre visit review using our clinic review tool, if applicable. No additional management support is needed unless otherwise documented below in the visit note. 

## 2013-11-29 NOTE — Patient Instructions (Signed)

## 2013-11-29 NOTE — Progress Notes (Signed)
   Subjective:    Patient ID: [name], female    DOB: 08/30/1950, 63 y.o.   MRN: [id number]  Sinusitis Associated symptoms include congestion, coughing, headaches and sinus pressure. Pertinent negatives include no chills.   Patient seen as a work in with approximately 4 to five-day history of progressive sinus symptoms. She's had headache, maxillary and frontal sinus pressure, cough, nasal drainage, sore throat, and intermittently or jaundice. Yellowish nasal discharge. She states she's had frequent sinusitis in the past. Denies any body aches. No nausea or vomiting. Allergy to penicillin. Nonsmoker.  Past Medical History  Diagnosis Date  . Allergy   . Hyperlipidemia   . IBS (irritable bowel syndrome)   . Lumbar disc disease   . Colon polyps   . Anxiety   . Asthma     /w resp. upset   . GERD (gastroesophageal reflux disease)   . Headache(784.0)     migraines -h/o  . Arthritis     back & knee   Past Surgical History  Procedure Laterality Date  . Abdominal hysterectomy    . Oophorectomy    . Carpal tunnel release      bilateral   . Esophagogastroduodenoscopy    . Rotator cuff repair      left   . Lasik    . Breast surgery      augmetation, implants- 1980  . Cholecystectomy      laparoscopic   . Bladder suspension      2007  . Total knee arthroplasty  04/15/2012    RIGHT KNEE  . Knee arthroscopy  04/15/2012    Procedure: ARTHROSCOPY KNEE;  Surgeon: [name], MD;  Location: [facility];  Service: Orthopedics;  Laterality: Right;  . Total knee arthroplasty  04/15/2012    Procedure: TOTAL KNEE ARTHROPLASTY;  Surgeon: [name], MD;  Location: [facility];  Service: Orthopedics;  Laterality: Right;    reports that she has never smoked. She has never used smokeless tobacco. She reports that she does not drink alcohol or use illicit drugs. family history includes Colon polyps in her maternal aunt and mother; Diabetes in her mother; Heart disease in her father;  Irritable bowel syndrome in her mother. Allergies  Allergen Reactions  . Penicillins     REACTION: rash      Review of Systems  Constitutional: Positive for fatigue. Negative for fever and chills.  HENT: Positive for congestion, postnasal drip, sinus pressure and voice change. Negative for facial swelling.   Respiratory: Positive for cough.   Neurological: Positive for headaches.       Objective:   Physical Exam  Constitutional: She appears well-developed and well-nourished.  HENT:  Right Ear: External ear normal.  Left Ear: External ear normal.  Nose: Nose normal.  Mouth/Throat: Oropharynx is clear and moist.  Neck: Neck supple.  Cardiovascular: Normal rate.   Pulmonary/Chest: Effort normal and breath sounds normal. No respiratory distress. She has no wheezes. She has no rales.  Lymphadenopathy:    She has no cervical adenopathy.          Assessment & Plan:  Acute sinusitis. We explained that frequently these are viral. Wrote prescription for Zithromax to start if she has persistent symptoms or any worsening symptoms.

## 2013-11-30 ENCOUNTER — Other Ambulatory Visit: Payer: Self-pay | Admitting: Internal Medicine

## 2013-11-30 ENCOUNTER — Telehealth: Payer: Self-pay | Admitting: Internal Medicine

## 2013-11-30 NOTE — Telephone Encounter (Signed)
Pt saw dr.burchette on Monday, pt was given azithromycin (ZITHROMAX Z-PAK) 250 MG tablet, pt states it is not helping for the cough, and she is unable to sleep because of the coughing, wants to know if she can get something called in for the cough. [facility] to [facility].

## 2013-12-01 MED ORDER — HYDROCODONE-HOMATROPINE 5-1.5 MG/5ML PO SYRP: 5.0000 mL | ORAL_SOLUTION | Freq: Four times a day (QID) | ORAL | Status: DC | PRN

## 2013-12-01 NOTE — Telephone Encounter (Signed)
If no allergies, Hycodan 1 teaspoon every 6 hours as needed for cough dispense 120 mL's with no refill

## 2013-12-01 NOTE — Telephone Encounter (Signed)
Pt is aware that RX is ready for pick up. 

## 2013-12-22 ENCOUNTER — Telehealth: Payer: Self-pay | Admitting: Family Medicine

## 2013-12-22 NOTE — Telephone Encounter (Signed)
Noted  

## 2013-12-22 NOTE — Telephone Encounter (Signed)
Patient Information:  Caller Name: Ahnika  Phone: [phone]  Patient: [name], [name]  Gender: Female  DOB: [birth year]  Age: 63 Years  PCP: Carolann Littler (Family Practice)  Office Follow Up:  Does the office need to follow up with this patient?: No  Instructions For The Office: N/A   Symptoms  Reason For Call & Symptoms: seen in the office 11/29/13 and diagnosed with sinusitis; completed course of Zithromax; says she started feeling better, but cough and HAs are lingering; still taking Hycodan cough syrup at night because Delsym will not work; no longer has colored nasal drainage; still has HAs and pressure every day  Reviewed Health History In EMR: Yes  Reviewed Medications In EMR: Yes  Reviewed Allergies In EMR: Yes  Reviewed Surgeries / Procedures: Yes  Date of Onset of Symptoms: 11/27/2013  Treatments Tried: Ibuprofen  Treatments Tried Worked: Yes  Guideline(s) Used:  Cough  Colds  Disposition Per Guideline:   See Today or Tomorrow in Office  Reason For Disposition Reached:   Sinus congestion (pressure, fullness) present > 10 days  Advice Given:  N/A  Patient Will Follow Care Advice:  YES  Appointment Scheduled:  12/23/2013 09:30:00 Appointment Scheduled Provider:  Carolann Littler (Family Practice)

## 2013-12-22 NOTE — Telephone Encounter (Signed)
Pt is coming in to see Dr. Elease Hashimoto on 12/23/13.

## 2013-12-23 ENCOUNTER — Ambulatory Visit (INDEPENDENT_AMBULATORY_CARE_PROVIDER_SITE_OTHER): Payer: BC Managed Care – PPO | Admitting: Family Medicine

## 2013-12-23 ENCOUNTER — Encounter: Payer: Self-pay | Admitting: Family Medicine

## 2013-12-23 VITALS — BP 132/82 | HR 102 | Temp 98.6°F | Wt 235.0 lb

## 2013-12-23 DIAGNOSIS — R05 Cough: Secondary | ICD-10-CM

## 2013-12-23 DIAGNOSIS — R059 Cough, unspecified: Secondary | ICD-10-CM

## 2013-12-23 MED ORDER — BENZONATATE 200 MG PO CAPS: 200.0000 mg | ORAL_CAPSULE | Freq: Three times a day (TID) | ORAL | Status: DC | PRN

## 2013-12-23 NOTE — Patient Instructions (Signed)
Cough, Adult  A cough is a reflex that helps clear your throat and airways. It can help heal the body or may be a reaction to an irritated airway. A cough may only last 2 or 3 weeks (acute) or may last more than 8 weeks (chronic).  CAUSES Acute cough:  Viral or bacterial infections. Chronic cough:  Infections.  Allergies.  Asthma.  Post-nasal drip.  Smoking.  Heartburn or acid reflux.  Some medicines.  Chronic lung problems (COPD).  Cancer. SYMPTOMS   Cough.  Fever.  Chest pain.  Increased breathing rate.  High-pitched whistling sound when breathing (wheezing).  Colored mucus that you cough up (sputum). TREATMENT   A bacterial cough may be treated with antibiotic medicine.  A viral cough must run its course and will not respond to antibiotics.  Your caregiver may recommend other treatments if you have a chronic cough. HOME CARE INSTRUCTIONS   Only take over-the-counter or prescription medicines for pain, discomfort, or fever as directed by your caregiver. Use cough suppressants only as directed by your caregiver.  Use a cold steam vaporizer or humidifier in your bedroom or home to help loosen secretions.  Sleep in a semi-upright position if your cough is worse at night.  Rest as needed.  Stop smoking if you smoke. SEEK IMMEDIATE MEDICAL CARE IF:   You have pus in your sputum.  Your cough starts to worsen.  You cannot control your cough with suppressants and are losing sleep.  You begin coughing up blood.  You have difficulty breathing.  You develop pain which is getting worse or is uncontrolled with medicine.  You have a fever. MAKE SURE YOU:   Understand these instructions.  Will watch your condition.  Will get help right away if you are not doing well or get worse. Document Released: 12/07/2010 Document Revised: 09/02/2011 Document Reviewed: 12/07/2010 ExitCare Patient Information 2015 [employer]. This information is not intended  to replace advice given to you by your health care provider. Make sure you discuss any questions you have with your health care provider.  

## 2013-12-23 NOTE — Progress Notes (Signed)
Pre visit review using our clinic review tool, if applicable. No additional management support is needed unless otherwise documented below in the visit note. 

## 2013-12-23 NOTE — Progress Notes (Signed)
Subjective:    Patient ID: [name], female    DOB: 1950-08-06, 63 y.o.   MRN: [id number]  Cough Associated symptoms include headaches. Pertinent negatives include no chills, fever, sore throat, shortness of breath or wheezing.  Sinusitis Associated symptoms include congestion, coughing and headaches. Pertinent negatives include no chills, shortness of breath or sore throat.   Patient seen with some persistent cough. She had recent URI symptoms and probable acute sinusitis was seen on 11/29/2013. She was treated with Zithromax. At that time, she had productive cough and yellowish nasal discharge. Her productive cough has stopped and is only dry this point. No fever. Overall feels better. Still has some frontal sinus headaches. Has been taking hydrocodone cough syrup at night. Inquiring about other alternatives. Has taken Delsym in the past. Denies any dyspnea  Past Medical History  Diagnosis Date  . Allergy   . Hyperlipidemia   . IBS (irritable bowel syndrome)   . Lumbar disc disease   . Colon polyps   . Anxiety   . Asthma     /w resp. upset   . GERD (gastroesophageal reflux disease)   . Headache(784.0)     migraines -h/o  . Arthritis     back & knee   Past Surgical History  Procedure Laterality Date  . Abdominal hysterectomy    . Oophorectomy    . Carpal tunnel release      bilateral   . Esophagogastroduodenoscopy    . Rotator cuff repair      left   . Lasik    . Breast surgery      augmetation, implants- 1980  . Cholecystectomy      laparoscopic   . Bladder suspension      2007  . Total knee arthroplasty  04/15/2012    RIGHT KNEE  . Knee arthroscopy  04/15/2012    Procedure: ARTHROSCOPY KNEE;  Surgeon: [name], MD;  Location: [place];  Service: Orthopedics;  Laterality: Right;  . Total knee arthroplasty  04/15/2012    Procedure: TOTAL KNEE ARTHROPLASTY;  Surgeon: [name], MD;  Location: [facility];  Service: Orthopedics;  Laterality: Right;    reports that she has never smoked. She has never used smokeless tobacco. She reports that she does not drink alcohol or use illicit drugs. family history includes Colon polyps in her maternal aunt and mother; Diabetes in her mother; Heart disease in her father; Irritable bowel syndrome in her mother. Allergies  Allergen Reactions  . Penicillins     REACTION: rash      Review of Systems  Constitutional: Negative for fever and chills.  HENT: Positive for congestion. Negative for sore throat.   Respiratory: Positive for cough. Negative for shortness of breath and wheezing.   Neurological: Positive for headaches.       Objective:   Physical Exam  Constitutional: She appears well-developed and well-nourished.  HENT:  Right Ear: External ear normal.  Left Ear: External ear normal.  Nose: Nose normal.  Mouth/Throat: Oropharynx is clear and moist.  Neck: Neck supple.  Cardiovascular: Normal rate.   Pulmonary/Chest: Effort normal and breath sounds normal. No respiratory distress. She has no wheezes. She has no rales.  Lymphadenopathy:    She has no cervical adenopathy.          Assessment & Plan:  Persistent cough. Possible recent acute sinusitis. Nonfocal exam. No indication for further antibiotics this time. Try Tessalon Perles 200 mg every 8 hours as needed for  cough. Followup promptly for fever or worsening symptoms

## 2014-01-07 ENCOUNTER — Telehealth: Payer: Self-pay | Admitting: Internal Medicine

## 2014-01-07 DIAGNOSIS — H5711 Ocular pain, right eye: Secondary | ICD-10-CM

## 2014-01-07 NOTE — Telephone Encounter (Signed)
Pt is requesting a referral to a neurologist, states dr. Loran Senters informed her to contact her primary pcp to get a referral. Pt is having some trouble with her right eye.

## 2014-01-07 NOTE — Telephone Encounter (Signed)
Referral placed.

## 2014-01-27 ENCOUNTER — Telehealth: Payer: Self-pay | Admitting: Family Medicine

## 2014-01-27 NOTE — Telephone Encounter (Signed)
This patient has made an appointment for 09/22 @ 1. Her husband has an appointment for 8/18 @1 . She would like to be seen the same day. Please advise the patient if available.

## 2014-01-27 NOTE — Telephone Encounter (Signed)
Called and Lm on pt VM TCB  

## 2014-01-28 ENCOUNTER — Telehealth: Payer: Self-pay

## 2014-01-28 NOTE — Telephone Encounter (Signed)
Pt and her husband scheduled to come in on 9/17, pt aware.

## 2014-01-28 NOTE — Telephone Encounter (Signed)
I called pt to try and accommodate her request regarding having her appt in Sept switched to the same day as her husband in Aug and she informed me that she got a call from someone earlier who moved their appts to Nov. She stated that this is not acceptable and that their is something wrong with this transition, she states she is not pleased with how this office is handling things and that we would not want her to do a survey b/c it would not be good. She wanted me to let the office manager know that if she happens to get sick between now and Nov that she does not care to see Dr. Yong Channel, she will only  See Dr. Elease Hashimoto and if he is not in she will switch her care elsewhere. Please Advise.Marland KitchenMarland Kitchen

## 2014-01-31 ENCOUNTER — Telehealth: Payer: Self-pay | Admitting: Family Medicine

## 2014-01-31 NOTE — Telephone Encounter (Signed)
Patient notified will check with Dr. Shea Evans on 02/01/14.

## 2014-01-31 NOTE — Telephone Encounter (Signed)
[name]/patient Phone [phone] called regarding Imitrex refill.  Has two left but usually needs 3 tablets for a migraine.  In October 2014, had migraines about  every 6 months but, lately had 3 migraines last week. Current migraine began 01/30/14.  Declined triage.  On Bactim now for sinus infection ordered by allergist, Dr Harold Hedge.  RPh, at [facility], told her a prior authorization is required for a refill.  [facility]ed prior auth approval  to office about 1500 01/31/14.  Asking how long prior authorization will take and what to use for headache while waiting for refill. Imitrix 100 mg #10 last filled 01/05/14. Has appointment to see Dr Yong Channel 03/10/14 and neurologist 02/15/14. Please call back with answers to medication questions.

## 2014-02-02 MED ORDER — SUMATRIPTAN SUCCINATE 100 MG PO TABS: ORAL_TABLET | ORAL | Status: DC

## 2014-02-02 NOTE — Telephone Encounter (Signed)
Pt verbalized understanding and had no questions 

## 2014-02-02 NOTE — Telephone Encounter (Signed)
I spoke to the pharmacy and was advised the pt can fill medication on 02/04/14

## 2014-02-02 NOTE — Telephone Encounter (Signed)
Spoke with Dr Arnoldo Morale and per Dr. Arnoldo Morale ok to change to qty of 9, rx sent in electroncially

## 2014-02-02 NOTE — Telephone Encounter (Signed)
Pt is aware that she has to wait until 02/04/14 to get her medication filled and would like to know what she could take in the mean time??

## 2014-02-02 NOTE — Telephone Encounter (Signed)
Aspirin 975 mg has shown similar efficacy to sumatriptan. She would need to take 3 of the 325mg  aspirin at the same time to achieve this. Potential SE-stomach upset or GI bleed but I think with 1x daily if needed over next 2 days use would be reasonable until can fill rx.

## 2014-02-02 NOTE — Telephone Encounter (Signed)
Dr. Hunter see below 

## 2014-02-02 NOTE — Telephone Encounter (Signed)
I called to submit the PA but was advised if you change the quantity to 9 instead of 10, the medication will not require a PA. Please advise.

## 2014-02-15 ENCOUNTER — Encounter: Payer: Self-pay | Admitting: Neurology

## 2014-02-15 ENCOUNTER — Ambulatory Visit (INDEPENDENT_AMBULATORY_CARE_PROVIDER_SITE_OTHER): Payer: BC Managed Care – PPO | Admitting: Neurology

## 2014-02-15 VITALS — BP 130/90 | HR 83 | Resp 16 | Ht 66.0 in | Wt 238.0 lb

## 2014-02-15 DIAGNOSIS — G43119 Migraine with aura, intractable, without status migrainosus: Secondary | ICD-10-CM

## 2014-02-15 DIAGNOSIS — R1310 Dysphagia, unspecified: Secondary | ICD-10-CM

## 2014-02-15 DIAGNOSIS — H02409 Unspecified ptosis of unspecified eyelid: Secondary | ICD-10-CM

## 2014-02-15 DIAGNOSIS — H532 Diplopia: Secondary | ICD-10-CM

## 2014-02-15 DIAGNOSIS — H02401 Unspecified ptosis of right eyelid: Secondary | ICD-10-CM

## 2014-02-15 MED ORDER — CYCLOBENZAPRINE HCL 5 MG PO TABS: ORAL_TABLET | ORAL | Status: DC

## 2014-02-15 NOTE — Progress Notes (Signed)
[facility] Clinic Note - Initial Visit   Date: 02/15/2014  LAHARI SUTTLES MRN: [id number] DOB: 12-27-1950   Dear Dr. Yong Channel:  Thank you for your kind referral of [facility] [place] for consultation of right ptosis. Although her history is well known to you, please allow [name] to reiterate it for the purpose of our medical record. The patient was accompanied to the clinic by self.    History of Present Illness: [name] is a 63 y.o. right-handed Caucasian female with history of hyperlipidemia, GERD, IBS, allergies, and migraines presenting for evaluation of right eye droopiness and headaches.    Since around May 2015, patient noticed that her right eye would become droopy and difficult to open at times.  She noticed that when she was watching TV, her left eye would be open, but right eye was closed. She reports having rare episodes of double vision, lasting 10-15 seconds.  She is unaware if it involved both eyes.  Also over the past several weeks, she has noticed difficulty swallowing liquids/solids occuring several times per week.  She finds herself having coughing spells at times.  She denies eye pain, slurred speech, shortness of breath, or limb weakness.  She has been sleeping on 2 pillows for a number of years.  She was evaluated by her eye care physician, Dr. Gershon Crane in July 2015 who referred her for evaluation of intermittent right ptosis.  Around July she developed sinus infection and was started on prednisone and antibiotics, but noticed that her migraines worsened.  Typically, she would get migraine every 73-month, but since her URI she has been migraines 3 per week.  Pain is throbbing and involves the left side.  She has visual aura, described as "dots".  She has associated nausea, photophobia, and phonophobia.  Duration is 8 hours.  Imitrex usually resolves her pain, but she has been taking more frequently.  This week, headaches are better and  she has not taken imitrex.  Out-side paper records, electronic medical record, and images have been reviewed where available and summarized as:  Lab Results  Component Value Date   TSH 3.38 12/09/2012   Lab Results  Component Value Date   HGBA1C 6.0 07/05/2009      Past Medical History  Diagnosis Date  . Allergy   . Hyperlipidemia   . IBS (irritable bowel syndrome)   . Lumbar disc disease   . Colon polyps   . Anxiety   . Asthma     /w resp. upset   . GERD (gastroesophageal reflux disease)   . Headache(784.0)     migraines -h/o  . Arthritis     back & knee    Past Surgical History  Procedure Laterality Date  . Abdominal hysterectomy    . Oophorectomy    . Carpal tunnel release      bilateral   . Esophagogastroduodenoscopy    . Rotator cuff repair      left   . Lasik    . Breast surgery      augmetation, implants- 1980  . Cholecystectomy      laparoscopic   . Bladder suspension      2007  . Total knee arthroplasty  04/15/2012    RIGHT KNEE  . Knee arthroscopy  04/15/2012    Procedure: ARTHROSCOPY KNEE;  Surgeon: DNinetta Lights MD;  [place]  Service: Orthopedics;  Laterality: Right;  . Total knee arthroplasty  04/15/2012    Procedure: TOTAL KNEE  ARTHROPLASTY;  Surgeon: [name], MD;  Location: [place];  Service: Orthopedics;  Laterality: Right;     Medications:  Current Outpatient Prescriptions on File Prior to Visit  Medication Sig Dispense Refill  . azelastine (ASTELIN) 137 MCG/SPRAY nasal spray Place 1 spray into the nose 2 (two) times daily.       . calcium-vitamin D (OSCAL WITH D) 500-200 MG-UNIT per tablet Take 1 tablet by mouth daily.      . [facility]S OMEPRAZOLE 20 MG TBEC TAKE 1 TABLET EVERY DAY  42 tablet  6  . DULoxetine (CYMBALTA) 30 MG capsule TAKE 3 CAPSULES (90 MG TOTAL) BY MOUTH DAILY.  90 capsule  10  . KLS ALLERCLEAR D-24HR 10-240 MG per 24 hr tablet TAKE 1 TABLET BY MOUTH ONCE DAILY  30 tablet  11  . metoCLOPramide (REGLAN) 10 MG  tablet TAKE 1/2 TABLET (5 MG TOTAL) BY MOUTH 4 (FOUR) TIMES DAILY.  60 tablet  5  . nabumetone (RELAFEN) 750 MG tablet TAKE 2 TABLETS EVERY DAY  60 tablet  4  . NASONEX 50 MCG/ACT nasal spray Place 2 sprays into the nose daily.       . psyllium (METAMUCIL SMOOTH TEXTURE) 28 % packet Take 1 packet by mouth 2 (two) times daily.       . rosuvastatin (CRESTOR) 20 MG tablet Take 20 mg by mouth. Take 1 tab twice weekly on Tuesday and Friday      . SUMAtriptan (IMITREX) 100 MG tablet TAKE 1 TABLET BY MOUTH EVERY 2 HOURS AS NEEDED FOR MIGRAINE  9 tablet  3   No current facility-administered medications on file prior to visit.    Allergies:  Allergies  Allergen Reactions  . Penicillins     REACTION: rash    Family History: Family History  Problem Relation Age of Onset  . COPD Mother     Deceased, 8  . Heart disease Father     Deceased, 80  . Colon polyps Mother   . Colon polyps Maternal Aunt   . Irritable bowel syndrome Mother     Social History: History   Social History  . Marital Status: Married    Spouse Name: N/A    Number of Children: 1  . Years of Education: N/A   Occupational History  . Retired    Social History Main Topics  . Smoking status: Never Smoker   . Smokeless tobacco: Never Used  . Alcohol Use: No  . Drug Use: No  . Sexual Activity: Yes   Other Topics Concern  . Not on file   Social History Narrative   She lives with husband and two dogs.   Highest level of education:  Master in education   She is retired Animal nutritionist x 32 years.          Review of Systems:  CONSTITUTIONAL: No fevers, chills, night sweats, or weight loss.   EYES: No visual changes or eye pain ENT: No hearing changes.  No history of nose bleeds.   RESPIRATORY: No cough, wheezing and shortness of breath.   CARDIOVASCULAR: Negative for chest pain, and palpitations.   GI: Negative for abdominal discomfort, blood in stools or black stools.  No recent change in bowel habits.     GU:  No history of incontinence.   MUSCLOSKELETAL: No history of joint pain or swelling.  No myalgias.   SKIN: Negative for lesions, +rash, and itching.   HEMATOLOGY/ONCOLOGY: Negative for prolonged bleeding, bruising easily, and swollen  nodes.  No history of cancer.   ENDOCRINE: Negative for cold or heat intolerance, polydipsia or goiter.   PSYCH:  No depression or anxiety symptoms.   NEURO: As Above.   Vital Signs:  BP 130/90  Pulse 83  Resp 16  Ht _0  (1.676 m)  Wt 238 lb (107.956 kg)  BMI 38.43 kg/m2  SpO2 94%   General Medical Exam:   General:  Well appearing, comfortable.   Eyes/ENT: see cranial nerve examination.   Neck: No masses appreciated.  Full range of motion without tenderness.  No carotid bruits. Respiratory:  Clear to auscultation, good air entry bilaterally.   Cardiac:  Regular rate and rhythm, no murmur.   Extremities:  No deformities, edema, or skin discoloration. Good capillary refill.   Skin:  Macular erythematous rash involving her legs.  Neurological Exam: MENTAL STATUS including orientation to time, place, person, recent and remote memory, attention span and concentration, language, and fund of knowledge is normal.  Speech is not dysarthric.  CRANIAL NERVES: II:  No visual field defects.  Unremarkable fundi.   III-IV-VI: Pupils equal round and reactive to light.  Normal conjugate, extra-ocular eye movements in all directions of gaze.  No nystagmus.  Right ptosis with lid lag. No worsening ptosis with sustained upgaze.   Ice pack test was incomplete due to patient intolerance.   V:  Normal facial sensation.     VII:  Mild flattening of the right nasolabial fold.  Movements and smile is symmtric.  Orbicularis oculi, oribicular oris, and buccinator muscles are 5/5.    VIII:  Normal hearing and vestibular function.   IX-X:  Normal palatal movement.   XI:  Normal shoulder shrug and head rotation.   XII:  Normal tongue strength and range of motion, no  deviation or fasciculation.  MOTOR:  No atrophy, fasciculations or abnormal movements.  No pronator drift.  Tone is normal.    Right Upper Extremity:    Left Upper Extremity:    Deltoid  5/5   Deltoid  5/5   Biceps  5/5   Biceps  5/5   Triceps  5/5   Triceps  5/5   Wrist extensors  5/5   Wrist extensors  5/5   Wrist flexors  5/5   Wrist flexors  5/5   Finger extensors  5/5   Finger extensors  5/5   Finger flexors  5/5   Finger flexors  5/5   Dorsal interossei  5/5   Dorsal interossei  5/5   Abductor pollicis  5/5   Abductor pollicis  5/5   Tone (Ashworth scale)  0  Tone (Ashworth scale)  0   Right Lower Extremity:    Left Lower Extremity:    Hip flexors  5/5   Hip flexors  5/5   Hip extensors  5/5   Hip extensors  5/5   Knee flexors  5/5   Knee flexors  5/5   Knee extensors  5/5   Knee extensors  5/5   Dorsiflexors  5/5   Dorsiflexors  5/5   Plantarflexors  5/5   Plantarflexors  5/5   Toe extensors  5/5   Toe extensors  5/5   Toe flexors  5/5   Toe flexors  5/5   Tone (Ashworth scale)  0  Tone (Ashworth scale)  0   MSRs:  Right  Left brachioradialis 2+  brachioradialis 2+  biceps 2+  biceps 2+  triceps 2+  triceps 2+  patellar 2+  patellar 2+  ankle jerk 2+  ankle jerk 2+  Hoffman no  Hoffman no  plantar response down  plantar response down   SENSORY:  Normal and symmetric perception of light touch, pinprick, vibration, and proprioception.  Romberg's sign absent.   COORDINATION/GAIT: Normal finger-to- nose-finger and heel-to-shin.  Intact rapid alternating movements bilaterally.  Able to rise from a chair without using arms.  Gait narrow based and stable. Mild unsteadiness with tandem and stressed gait.    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  Her exam shows intermittent right ptosis with lid  lag on that side.  There is also mild flattening of the right nasolabial fold, however smile is symmetric and there is no weakness of the facial muscles.  Icepack testing was incomplete as patient was unable to tolerate testing due to discomfort.  Based on her history and exam, myasthenia gravis is high on the differential, so I will check for AChR antibodies and screen for thyroid disease.  I will also schedule her for repetitive nerve stimulation, however, if labs are diagnostic, this can be cancelled.  Regard the recent worsening of her episodic migraine, it may have been related to viral syndrome.  Fortunately, it seems frequency is improving, so will continue to follow her. She is currently not having headaches >15 days per month, so will not start preventative agent.  If there is no improvement, will obtain MRI brain wwo contrast.   PLAN/RECOMMENDATIONS:  1.  Check MG panel, ESR, CRP, and TSH 2.  EMG of the right side with RNS 3.  Trial of flexeril 31m as rescue therapy for migraine.  Patient informed to stop medication if symptoms worsen. 4.  Recommended being cautious when driving 5.  Return to clinic in 254-month   The duration of this appointment visit was 60 minutes of face-to-face time with the patient.  Greater than 50% of this time was spent in counseling, explanation of diagnosis, planning of further management, and coordination of care.   Thank you for allowing me to participate in patient's care.  If I can answer any additional questions, I would be pleased to do so.    Sincerely,    Alease Fait K. PaPosey ProntoDO

## 2014-02-15 NOTE — Patient Instructions (Addendum)
1.  Check myasthenia gravis antibody panel and thyroid studies 2.  EMG of the right side with repetitive nerve stimulation.  If labs are diagnostic, EMG can be cancelled. 3.  Return to clinic 45-months  ELECTROMYOGRAM AND NERVE CONDUCTION STUDIES (EMG/NCS) INSTRUCTIONS  How to Prepare The neurologist conducting the EMG will need to know if you have certain medical conditions. Tell the neurologist and other EMG lab personnel if you:   Have a pacemaker or any other electrical medical device   Take blood-thinning medications   Have hemophilia, a blood-clotting disorder that causes prolonged bleeding Bathing Take a shower or bath shortly before your exam in order to remove oils from your skin. Don't apply lotions or creams before the exam.  What to Expect You'll likely be asked to change into a hospital gown for the procedure and lie down on an examination table. The following explanations can help you understand what will happen during the exam.    Electrodes. The neurologist or a technician places surface electrodes at various locations on your skin depending on where you're experiencing symptoms. Or the neurologist may insert needle electrodes at different sites depending on your symptoms.    Sensations. The electrodes will at times transmit a tiny electrical current that you may feel as a twinge or spasm. The needle electrode may cause discomfort or pain that usually ends shortly after the needle is removed. If you are concerned about discomfort or pain, you may want to talk to the neurologist about taking a short break during the exam.    Instructions. During the needle EMG, the neurologist will assess whether there is any spontaneous electrical activity when the muscle is at rest - activity that isn't present in healthy muscle tissue - and the degree of activity when you slightly contract the muscle.  He or she will give you instructions on resting and contracting a muscle at appropriate times.  Depending on what muscles and nerves the neurologist is examining, he or she may ask you to change positions during the exam.  After your EMG You may experience some temporary, minor bruising where the needle electrode was inserted into your muscle. This bruising should fade within several days. If it persists, contact your primary care doctor.

## 2014-02-16 LAB — C-REACTIVE PROTEIN: CRP: <0.5 mg/dL (ref <0.60)

## 2014-02-16 LAB — TSH: TSH: 2.664 u[IU]/mL (ref 0.350–4.500)

## 2014-02-16 LAB — SEDIMENTATION RATE: Sed Rate: 6 mm/hr (ref 0–22)

## 2014-02-21 ENCOUNTER — Telehealth: Payer: Self-pay | Admitting: Family Medicine

## 2014-02-21 MED ORDER — ROSUVASTATIN CALCIUM 20 MG PO TABS: 20.0000 mg | ORAL_TABLET | ORAL | Status: DC

## 2014-02-21 NOTE — Telephone Encounter (Signed)
Dr. Hunter please see below 

## 2014-02-21 NOTE — Telephone Encounter (Signed)
Pt is currently and jenkins pt, pt has appt on 9/17 to est with hunter. Pt states that dr.jenkins was giving her samples of crestor, and now we no longer have samples. Pt states she was taking the crestor twice a week and will take her last one on tomorrow, pt wants to know does she just not take anymore until her appt with dr. Yong Channel.

## 2014-02-21 NOTE — Telephone Encounter (Signed)
Please find out dosage patient was taking and call in a refill for her to take twice weekly.

## 2014-02-21 NOTE — Telephone Encounter (Signed)
Pt states she is on 20mg 

## 2014-02-21 NOTE — Telephone Encounter (Signed)
Medication called in 

## 2014-02-24 LAB — MYASTHENIA GRAVIS PANEL 2
Acetylcholine Rec Binding: <0.3 nmol/L
Acetylcholine Rec Mod Ab: 12 %
Aceytlcholine Rec Bloc Ab: <15 % of inhibition (ref <15)

## 2014-03-02 ENCOUNTER — Other Ambulatory Visit: Payer: Self-pay | Admitting: Internal Medicine

## 2014-03-07 ENCOUNTER — Other Ambulatory Visit: Payer: BC Managed Care – PPO

## 2014-03-10 ENCOUNTER — Encounter: Payer: Self-pay | Admitting: Family Medicine

## 2014-03-10 ENCOUNTER — Ambulatory Visit (INDEPENDENT_AMBULATORY_CARE_PROVIDER_SITE_OTHER): Payer: BC Managed Care – PPO | Admitting: Family Medicine

## 2014-03-10 VITALS — BP 122/84 | HR 88 | Temp 98.8°F | Wt 236.0 lb

## 2014-03-10 DIAGNOSIS — Z23 Encounter for immunization: Secondary | ICD-10-CM

## 2014-03-10 DIAGNOSIS — F411 Generalized anxiety disorder: Secondary | ICD-10-CM

## 2014-03-10 DIAGNOSIS — G43109 Migraine with aura, not intractable, without status migrainosus: Secondary | ICD-10-CM

## 2014-03-10 DIAGNOSIS — G43909 Migraine, unspecified, not intractable, without status migrainosus: Secondary | ICD-10-CM | POA: Insufficient documentation

## 2014-03-10 DIAGNOSIS — K219 Gastro-esophageal reflux disease without esophagitis: Secondary | ICD-10-CM | POA: Insufficient documentation

## 2014-03-10 DIAGNOSIS — E785 Hyperlipidemia, unspecified: Secondary | ICD-10-CM

## 2014-03-10 MED ORDER — ATORVASTATIN CALCIUM 40 MG PO TABS: ORAL_TABLET | ORAL | Status: DC

## 2014-03-10 NOTE — Patient Instructions (Addendum)
Things look great today!   We will try atorvastatin to see if that is cheaper. We will check cholesterol if at least 6 weeks from today when I see you back.  Front desk-set up CPE before the end of the year. Patient may be 6th 30 minute slot per full day. Labs 1 week before.   You did have a murmur today. If you had chest pain, shortness of breath, worsening fatigue, swelling in your legs see me sooner. It could just be mild dehydration. When we see you back we will listen again.   Health Maintenance Due  Topic Date Due  . Zostavax - call insurance 11/26/2010

## 2014-03-10 NOTE — Assessment & Plan Note (Addendum)
Well controlled on Cymbalta. Continue. Advised adding regular exercise through water aerobics

## 2014-03-10 NOTE — Assessment & Plan Note (Addendum)
Well controlled on crestor twice a week. Trial atorvastatin 40mg  twice a week due to cost of crestor. Recheck LDL next visit. May need 3x a week.

## 2014-03-10 NOTE — Progress Notes (Addendum)
Hayley Reddish, MD Phone: [phone]  Subjective:  Patient presents today to establish care with me as their new primary care provider. Patient was formerly a patient of Dr. Arnoldo Morale. Chief complaint-noted.   Hyperlipidemia-well controlled  Lab Results  Component Value Date   LDLCALC 79 12/09/2012  On statin: crestor 20mg  twice a week Regular exercise: no, multiple pains ROS- no chest pain or shortness of breath. No myalgias  Migraines-controlled 1-2x a year. Uses sumatriptan ROS- no blurry vision. Does get aura.   Generalized anxiety disorder-moderate control Has done well on cymbalta.  ROS- no SI HI  The following were reviewed and entered/updated in epic: Past Medical History  Diagnosis Date  . Allergy   . Hyperlipidemia   . IBS (irritable bowel syndrome)   . Lumbar disc disease   . Colon polyps   . Anxiety   . Asthma     /w resp. upset   . GERD (gastroesophageal reflux disease)   . Headache(784.0)     migraines -h/o  . Arthritis     back & knee   Patient Active Problem List   Diagnosis Date Noted  . Generalized anxiety disorder 03/10/2014    Priority: Medium  . Migraines 03/10/2014    Priority: Medium  . Irritable bowel syndrome 12/15/2008    Priority: Medium  . OBESITY, MORBID 10/12/2008    Priority: Medium  . HYPERLIPIDEMIA 02/09/2008    Priority: Medium  . GERD (gastroesophageal reflux disease) 03/10/2014    Priority: Low  . Acute sinusitis 08/08/2012    Priority: Low  . EUSTACHIAN TUBE DYSFUNCTION, CHRONIC 06/14/2010    Priority: Low  . Low back pain 08/11/2008    Priority: Low  . ALLERGIC RHINITIS 02/12/2007    Priority: Low  . ASTHMA 02/12/2007    Priority: Low   Past Surgical History  Procedure Laterality Date  . Abdominal hysterectomy    . Oophorectomy    . Carpal tunnel release      bilateral   . Esophagogastroduodenoscopy    . Rotator cuff repair      left   . Lasik    . Breast surgery      augmetation, implants- 1980  .  Cholecystectomy      laparoscopic   . Bladder suspension      2007  . Total knee arthroplasty  04/15/2012    RIGHT KNEE  . Knee arthroscopy  04/15/2012    Procedure: ARTHROSCOPY KNEE;  Surgeon: [name], MD;  Location: [place];  Service: Orthopedics;  Laterality: Right;  . Total knee arthroplasty  04/15/2012    Procedure: TOTAL KNEE ARTHROPLASTY;  Surgeon: [name], MD;  Location: [place];  Service: Orthopedics;  Laterality: Right;    Family History  Problem Relation Age of Onset  . COPD Mother     Deceased, 74  . Heart disease Father     Deceased, 39, alcoholic-death cause not truly known  . Colon polyps Mother   . Colon polyps Maternal Aunt   . Irritable bowel syndrome Mother     Medications- reviewed and updated Current Outpatient Prescriptions  Medication Sig Dispense Refill  . calcium-vitamin D (OSCAL WITH D) 500-200 MG-UNIT per tablet Take 1 tablet by mouth daily.      . [facility]S OMEPRAZOLE 20 MG TBEC TAKE 1 TABLET EVERY DAY  42 tablet  6  . DULoxetine (CYMBALTA) 30 MG capsule TAKE 3 CAPSULES (90 MG TOTAL) BY MOUTH DAILY.  90 capsule  10  . KLS ALLERCLEAR  D-24HR 10-240 MG per 24 hr tablet TAKE 1 TABLET BY MOUTH ONCE DAILY  30 tablet  11  . metoCLOPramide (REGLAN) 10 MG tablet TAKE 1/2 TABLET (5 MG TOTAL) BY MOUTH 4 (FOUR) TIMES DAILY.  60 tablet  5  . nabumetone (RELAFEN) 750 MG tablet TAKE 2 TABLETS EVERY DAY  60 tablet  2  . psyllium (METAMUCIL SMOOTH TEXTURE) 28 % packet Take 1 packet by mouth 2 (two) times daily.       . SUMAtriptan (IMITREX) 100 MG tablet TAKE 1 TABLET BY MOUTH EVERY 2 HOURS AS NEEDED FOR MIGRAINE  9 tablet  3  . atorvastatin (LIPITOR) 40 MG tablet Take on Tuesday and Friday 1 pill at night time.  30 tablet  11  . azelastine (ASTELIN) 137 MCG/SPRAY nasal spray Place 1 spray into the nose 2 (two) times daily.       . cyclobenzaprine (FLEXERIL) 5 MG tablet Take one tablet twice daily as needed for headaches.  Try to limit to twice per week.  30  tablet  3  . NASONEX 50 MCG/ACT nasal spray Place 2 sprays into the nose daily.        No current facility-administered medications for this visit.    Allergies-reviewed and updated Allergies  Allergen Reactions  . Penicillins     REACTION: rash    History   Social History  . Marital Status: Married    Spouse Name: N/A    Number of Children: 1  . Years of Education: N/A   Occupational History  . Retired    Social History Main Topics  . Smoking status: Never Smoker   . Smokeless tobacco: Never Used  . Alcohol Use: No  . Drug Use: No  . Sexual Activity: Yes   Other Topics Concern  . None   Social History Narrative   She lives with husband (1978) and two dogs. Step-son [name] (1971-psychiatrist in [place]). No grandkids. 2 dogs-sheltie/collie mix and border collie mix      Highest level of education:  Master in education   She is retired Animal nutritionist x 32 years.      Hobbies: time with dogs             ROS--See HPI   Objective: BP 122/84  Pulse 88  Temp(Src) 98.8 F (37.1 C)  Wt 236 lb (107.049 kg) Gen: NAD, resting comfortably HEENT: Mildly dry mucus membranes. Oropharynx normal Neck: no thyromegaly CV: RRR 3/6 SEM radiating to carotids Lungs: CTAB no crackles, wheeze, rhonchi Abdomen: soft/nontender/nondistended/normal bowel sounds.  Ext: no edema. 2+ DP pulses and radial Skin: warm, dry, no rash Neuro: grossly normal, moves all extremities, PERRLA  Assessment/Plan:  HYPERLIPIDEMIA Well controlled on crestor twice a week. Trial atorvastatin 40mg  twice a week due to cost of crestor. Recheck LDL next visit. May need 3x a week.   Migraines Well-controlled on when necessary sumatriptan. Uses 1-2 times a year  Generalized anxiety disorder Well controlled on Cymbalta. Continue. Advised adding regular exercise through water aerobics   Asymptomatic murmur noted-discussed echocardiogram at this time vs. Recheck at physical and patient opts  for recheck at physical. She is mildly dry on exam and could be flow murmur.   Meds ordered this encounter  Medications  . atorvastatin (LIPITOR) 40 MG tablet    Sig: Take on Tuesday and Friday 1 pill at night time.    Dispense:  30 tablet    Refill:  11

## 2014-03-10 NOTE — Assessment & Plan Note (Signed)
Well-controlled on when necessary sumatriptan. Uses 1-2 times a year

## 2014-03-14 ENCOUNTER — Encounter: Payer: BC Managed Care – PPO | Admitting: Internal Medicine

## 2014-03-15 ENCOUNTER — Ambulatory Visit: Payer: BC Managed Care – PPO | Admitting: Family Medicine

## 2014-03-21 ENCOUNTER — Ambulatory Visit (INDEPENDENT_AMBULATORY_CARE_PROVIDER_SITE_OTHER): Payer: BC Managed Care – PPO | Admitting: Neurology

## 2014-03-21 DIAGNOSIS — H02409 Unspecified ptosis of unspecified eyelid: Secondary | ICD-10-CM

## 2014-03-21 DIAGNOSIS — R1310 Dysphagia, unspecified: Secondary | ICD-10-CM

## 2014-03-21 DIAGNOSIS — H532 Diplopia: Secondary | ICD-10-CM

## 2014-03-21 DIAGNOSIS — H02401 Unspecified ptosis of right eyelid: Secondary | ICD-10-CM

## 2014-03-21 NOTE — Procedures (Signed)
[facility]  [address] [place], [place]  [place], [place] [address] Tel: [phone] Fax:  [phone] Test Date:  03/21/2014  Patient: [name] DOB: 1950/08/23 Physician: Narda Amber, DO  Sex: Female Height: 5\' 6"  Ref Phys: Narda Amber  ID#: [id number] Temp: 35.0C Technician: Laureen Ochs R. NCS T.   Patient Complaints: This is a 63 year old female presenting for evaluation of intermittent right eye ptosis, diplopia and dysphagia for last 6 months.  NCV & EMG Findings: Extensive electrodiagnostic testing of the right upper extremity and right lower extremity with repetitive nerve stimulation of the facial, median, and peroneal nerves shows the following: 1. Normal median and sural sensory responses. 2. Normal median and peroneal motor responses. 3. Repetitive nerve stimulation of the facial, median, and peroneal nerves recording at the nasalis, abductor pollicis brevis, and tibialis anterior, respectively, is within normal limits. 4. Needle electrode examination shows normal motor unit configuration and recruitment pattern in all of the tested muscles of the upper and lower extremities to  Impression: This is a normal study of the right upper and lower extremity. In particular, there is no evidence of a neuromuscular junction disorder, generalized myopathy, or cervical/lumbosacral radiculopathy affecting the right side.    ___________________________ Narda Amber, DO    Nerve Conduction Studies Anti Sensory Summary Table   Site NR Peak (ms) Norm Peak (ms) P-T Amp (V) Norm P-T Amp  Right Median Anti Sensory (2nd Digit)  35C  Wrist    2.8 <3.8 36.7 >10  Right Sural Anti Sensory (Lat Mall)  Calf    3.5 <4.6 10.4 >3   Motor Summary Table   Site NR Onset (ms) Norm Onset (ms) O-P Amp (mV) Norm O-P Amp Site1 Site2 Delta-0 (ms) Dist (cm) Vel (m/s) Norm Vel (m/s)  Right Median Motor (Abd Poll Brev)  35C  Wrist    3.0 <4.0 8.0 >5 Elbow Wrist 4.4 26.5 60 >50    Elbow    7.4  7.6         Right Peroneal Motor (Ext Dig Brev)  35C  Ankle    3.2 <6.0 4.2 >2.5 B Fib Ankle 7.6 34.5 45 >40  B Fib    10.8  3.7  Poplt B Fib 1.9 10.0 53 >40  Poplt    12.7  3.7         Right Peroneal TA Motor (Tib Ant)  35C  Fib Head    2.9 <4.5 4.6 >3 Poplit Fib Head 1.7 10.0 59 >40  Poplit    4.6  4.3         Right Spin Accessory Motor (Up Trapezius)  35C  Neck    2.7  3.0          EMG   Side Muscle Ins Act Fibs Psw Fasc Number Recrt Dur Dur. Amp Amp. Poly Poly. Comment  Right 1stDorInt Nml Nml Nml Nml Nml Nml Nml Nml Nml Nml Nml Nml N/A  Right Ext Indicis Nml Nml Nml Nml Nml Nml Nml Nml Nml Nml Nml Nml N/A  Right BrachioRad Nml Nml Nml Nml Nml Nml Nml Nml Nml Nml Nml Nml N/A  Right PronatorTeres Nml Nml Nml Nml Nml Nml Nml Nml Nml Nml Nml Nml N/A  Right Biceps Nml Nml Nml Nml Nml Nml Nml Nml Nml Nml Nml Nml N/A  Right Triceps Nml Nml Nml Nml Nml Nml Nml Nml Nml Nml Nml Nml N/A  Right Deltoid Nml Nml Nml Nml Nml Nml Nml Nml Nml Nml  Nml Nml N/A  Right AntTibialis Nml Nml Nml Nml Nml Nml Nml Nml Nml Nml Nml Nml N/A  Right Gastroc Nml Nml Nml Nml Nml Nml Nml Nml Nml Nml Nml Nml N/A  Right RectFemoris Nml Nml Nml Nml Nml Nml Nml Nml Nml Nml Nml Nml N/A  Right GluteusMed Nml Nml Nml Nml Nml Nml Nml Nml Nml Nml Nml Nml N/A  Right BicepsFemS Nml Nml Nml Nml Nml Nml Nml Nml Nml Nml Nml Nml N/A   RNS   Trial # Label Amp 1 (mV)  O-P Amp 5 (mV)  O-P Amp % Dif Area 1 (mVms) Area 5 (mVms) Area % Dif Rep Rate Train Length Pause Time (min:sec) Comments  Right AntTibialis  Tr 1 Baseline 4.37 4.41 1.0 25.57 25.33 -0.9 3.00 10 00:30   Tr 2 Post Exercise 4.47 4.66 4.2 25.63 26.99 5.3 3.00 10 01:00   Tr 3 1 Min Post 4.66 3.87 -17.1 28.19 20.71 -26.5 3.00 10 01:00   Tr 4 2 Min Post 4.54 4.64 2.1 28.56 28.74 0.6 3.00 10 01:00   Tr 5 3 Min Post 4.71 4.79 1.6 29.97 29.31 -2.2 3.00 10 00:00   Right Abd Poll Brev  Tr 1 Baseline 8.12 7.80 -4.0 17.66 12.92 -26.8 3.00 10 00:30   Tr 2  Post Exercise 7.09 6.54 -7.8 11.25 10.15 -9.7 3.00 10 01:00   Tr 3 1 Min Post 7.84 7.55 -3.7 14.05 12.93 -8.0 3.00 10 01:00   Tr 4 2 Min Post 7.87 7.76 -1.4 13.52 12.86 -4.9 3.00 10 01:00   Tr 5 3 Min Post 7.59 7.30 -3.7 13.15 12.22 -7.1 3.00 10 00:00   Right Nasalis  Tr 1 Baseline 1.64 1.63 -0.4 5.78 5.61 -2.9 3.00 10 00:30   Tr 4 Post Exercise 1.77 1.64 -7.8 6.16 5.68 -7.9 3.00 10 01:00   Tr 5 1 Min Post 1.56 1.52 -2.0 5.30 5.14 -3.0 3.00 10 01:00   Tr 6 2 Min Post 1.75 1.68 -3.8 6.04 5.77 -4.5 3.00 10 01:00   Tr 7 3 Min Post 1.57 1.50 -4.2 5.22 4.95 -5.1 3.00 10 00:00                Waveforms:

## 2014-03-22 ENCOUNTER — Encounter: Payer: Self-pay | Admitting: Neurology

## 2014-03-22 ENCOUNTER — Ambulatory Visit (INDEPENDENT_AMBULATORY_CARE_PROVIDER_SITE_OTHER): Payer: BC Managed Care – PPO | Admitting: Neurology

## 2014-03-22 VITALS — BP 124/80 | HR 88 | Ht 66.0 in | Wt 240.2 lb

## 2014-03-22 DIAGNOSIS — H02401 Unspecified ptosis of right eyelid: Secondary | ICD-10-CM

## 2014-03-22 DIAGNOSIS — R1310 Dysphagia, unspecified: Secondary | ICD-10-CM

## 2014-03-22 DIAGNOSIS — H02409 Unspecified ptosis of unspecified eyelid: Secondary | ICD-10-CM

## 2014-03-22 MED ORDER — PYRIDOSTIGMINE BROMIDE 60 MG PO TABS: 60.0000 mg | ORAL_TABLET | Freq: Two times a day (BID) | ORAL | Status: DC

## 2014-03-22 NOTE — Progress Notes (Signed)
Follow-up Visit   Date: 03/22/2014    MARITSSA HAUGHTON MRN: [id number] DOB: 11/27/1950   Interim History: Cristopher Peru Eschbach is a 63 y.o. right-handed Caucasian female with history of hyperlipidemia, GERD, IBS, allergies, and migraines returning to the clinic for follow-up of right ptosis.  The patient was accompanied to the clinic by husband who also provides collateral information.    History of present illness: Since around May 2015, patient noticed that her right eye would become droopy and difficult to open at times. She noticed that when she was watching TV, her left eye would be open, but right eye was closed. She reports having rare episodes of double vision, lasting 10-15 seconds. She is unaware if it involved both eyes. Also over the past several weeks, she has noticed difficulty swallowing liquids/solids occuring several times per week. She finds herself having coughing spells at times. She denies eye pain, slurred speech, shortness of breath, or limb weakness. She has been sleeping on 2 pillows for a number of years. She was evaluated by her eye care physician, Dr. Gershon Crane in July 2015 who referred her for evaluation of intermittent right ptosis.  Around July she developed sinus infection and was started on prednisone and antibiotics, but noticed that her migraines worsened. Typically, she would get migraine every 60-month, but since her URI she has been migraines 3 per week. Pain is throbbing and involves the left side. She has visual aura, described as "dots". She has associated nausea, photophobia, and phonophobia. Duration is 8 hours. Imitrex usually resolves her pain, but she has been taking more frequently. This week, headaches are better and she has not taken imitrex.   UPDATE 03/22/2014:  She continues to have intermittent spells of difficulty swallowing liquids/solids, right eye ptosis.  She notices it worse in the morning.  No double vision or shortness of breath. She is  here to discuss her EMG results which does not show any evidence of a neuromuscular junction disorder.    Medications:  Current Outpatient Prescriptions on File Prior to Visit  Medication Sig Dispense Refill  . atorvastatin (LIPITOR) 40 MG tablet Take on Tuesday and Friday 1 pill at night time.  30 tablet  11  . azelastine (ASTELIN) 137 MCG/SPRAY nasal spray Place 1 spray into the nose 2 (two) times daily.       . calcium-vitamin D (OSCAL WITH D) 500-200 MG-UNIT per tablet Take 1 tablet by mouth daily.      . [facility] OMEPRAZOLE 20 MG TBEC TAKE 1 TABLET EVERY DAY  42 tablet  6  . cyclobenzaprine (FLEXERIL) 5 MG tablet Take one tablet twice daily as needed for headaches.  Try to limit to twice per week.  30 tablet  3  . DULoxetine (CYMBALTA) 30 MG capsule TAKE 3 CAPSULES (90 MG TOTAL) BY MOUTH DAILY.  90 capsule  10  . KLS ALLERCLEAR D-24HR 10-240 MG per 24 hr tablet TAKE 1 TABLET BY MOUTH ONCE DAILY  30 tablet  11  . metoCLOPramide (REGLAN) 10 MG tablet TAKE 1/2 TABLET (5 MG TOTAL) BY MOUTH 4 (FOUR) TIMES DAILY.  60 tablet  5  . nabumetone (RELAFEN) 750 MG tablet TAKE 2 TABLETS EVERY DAY  60 tablet  2  . NASONEX 50 MCG/ACT nasal spray Place 2 sprays into the nose daily.       . psyllium (METAMUCIL SMOOTH TEXTURE) 28 % packet Take 1 packet by mouth 2 (two) times daily.       .[name]  SUMAtriptan (IMITREX) 100 MG tablet TAKE 1 TABLET BY MOUTH EVERY 2 HOURS AS NEEDED FOR MIGRAINE  9 tablet  3   No current facility-administered medications on file prior to visit.    Allergies:  Allergies  Allergen Reactions  . Penicillins     REACTION: rash     Review of Systems:  CONSTITUTIONAL: No fevers, chills, night sweats, or weight loss.   EYES: No visual changes or eye pain ENT: No hearing changes.  No history of nose bleeds.   RESPIRATORY: No cough, wheezing and shortness of breath.   CARDIOVASCULAR: Negative for chest pain, and palpitations.   GI: Negative for abdominal discomfort, blood in stools or  black stools.  No recent change in bowel habits.   GU:  No history of incontinence.   MUSCLOSKELETAL: No history of joint pain or swelling.  No myalgias.   SKIN: Negative for lesions, rash, and itching.   ENDOCRINE: Negative for cold or heat intolerance, polydipsia or goiter.   PSYCH:  No depression or anxiety symptoms.   NEURO: As Above.   Vital Signs:  BP 124/80  Pulse 88  Ht _0  (1.676 m)  Wt 240 lb 4 oz (108.977 kg)  BMI 38.80 kg/m2  SpO2 95%  Neurological Exam: MENTAL STATUS including orientation to time, place, person, recent and remote memory, attention span and concentration, language, and fund of knowledge is normal.  Speech is not dysarthric.  CRANIAL NERVES:  Pupils equal round and reactive to light.  Normal conjugate, extra-ocular eye movements in all directions of gaze.  Right ptosis with lid lag and mild asymmetry with blink.  There is mild flattening of the right nasolabial fold.  MOTOR:  Motor strength is 5/5 in all extremities.  Tone is normal.    MSRs:  Reflexes are 2+/4 throughout.  COORDINATION/GAIT:   Gait narrow based and stable.   Data: EMG 03/21/2014: This is a normal study of the right upper and lower extremity. In particular, there is no evidence of a neuromuscular junction disorder, generalized myopathy, or cervical/lumbosacral radiculopathy affecting the right side.   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 Her exam shows intermittent right ptosis with lid lag on that side. There is also mild flattening of the right nasolabial fold, however smile is symmetric and there is no weakness of the facial muscles.   Testing for myasthenia gravis including AChR binding, blocking, and modulating antibodies returned negative and her EMG with  repetitive nerve stimulation was normal.  I explained that the work-up to date does not clearly define an etiology.  Options include (1) watching symptoms and seeing how they evolve, (2) referral to academic center for an opinion and/or SFEMG, or (3) trial of medication for possible myasthenia.  She has elected to try mestinon, which I think is very reasonable. Blepharospasm is another consideration, if there is no improvement with mestinon.  Start mestinon 52m daily for one week, then increase to twice daily.  Return to clinic in 6 weeks, or sooner as needed.   The duration of this appointment visit was 30 minutes of face-to-face time with the patient.  Greater than 50% of this time was spent in counseling, explanation of diagnosis, planning of further management, and coordination of care.   Thank you for allowing me to participate in patient's care.  If  I can answer any additional questions, I would be pleased to do so.    Sincerely,    Tameisha Covell K. Posey Pronto, DO

## 2014-03-22 NOTE — Patient Instructions (Signed)
Your testing does not clearly define the exact cause of your symptoms.  The EMG performed yesterday does not show any evidence of neuromuscular junction disorder.  Options include (1) watching symptoms and seeing how they evolve, (2) referral to academic center for an opinion and/or additional testing, or (3) trial of medication for possible myasthenia.   We will try medication for myasthenia and see how that works.   Start mestinon 60mg  daily for one week.  If you are tolerating it, increase to one tablet twice daily thereafter.   [phone]. Return to clinic 6 weeks.

## 2014-03-29 ENCOUNTER — Ambulatory Visit: Payer: BC Managed Care – PPO | Admitting: Neurology

## 2014-04-01 ENCOUNTER — Encounter: Payer: Self-pay | Admitting: Family Medicine

## 2014-04-19 ENCOUNTER — Telehealth: Payer: Self-pay | Admitting: Family Medicine

## 2014-04-19 MED ORDER — OMEPRAZOLE 20 MG PO TBEC
1.0000 | DELAYED_RELEASE_TABLET | Freq: Every day | ORAL | Status: DC
Start: 2014-04-19 — End: 2014-05-12

## 2014-04-19 NOTE — Telephone Encounter (Signed)
Medication refilled

## 2014-04-19 NOTE — Telephone Encounter (Signed)
[id number] [place], [place] is requesting re-fill on [facility] OMEPRAZOLE 20 MG TBEC

## 2014-04-21 ENCOUNTER — Ambulatory Visit: Payer: BC Managed Care – PPO | Attending: Physical Therapy | Admitting: Physical Therapy

## 2014-04-21 DIAGNOSIS — Z5189 Encounter for other specified aftercare: Secondary | ICD-10-CM | POA: Insufficient documentation

## 2014-04-21 DIAGNOSIS — Z96651 Presence of right artificial knee joint: Secondary | ICD-10-CM | POA: Diagnosis not present

## 2014-04-21 DIAGNOSIS — M545 Low back pain: Secondary | ICD-10-CM | POA: Diagnosis not present

## 2014-04-21 DIAGNOSIS — J45909 Unspecified asthma, uncomplicated: Secondary | ICD-10-CM | POA: Diagnosis not present

## 2014-04-21 DIAGNOSIS — Z96612 Presence of left artificial shoulder joint: Secondary | ICD-10-CM | POA: Diagnosis not present

## 2014-04-26 ENCOUNTER — Ambulatory Visit: Payer: BC Managed Care – PPO | Attending: Physical Medicine and Rehabilitation | Admitting: Physical Therapy

## 2014-04-26 DIAGNOSIS — Z5189 Encounter for other specified aftercare: Secondary | ICD-10-CM | POA: Insufficient documentation

## 2014-04-26 DIAGNOSIS — M545 Low back pain: Secondary | ICD-10-CM | POA: Diagnosis not present

## 2014-04-26 DIAGNOSIS — Z96612 Presence of left artificial shoulder joint: Secondary | ICD-10-CM | POA: Diagnosis not present

## 2014-04-26 DIAGNOSIS — Z96651 Presence of right artificial knee joint: Secondary | ICD-10-CM | POA: Insufficient documentation

## 2014-04-26 DIAGNOSIS — J45909 Unspecified asthma, uncomplicated: Secondary | ICD-10-CM | POA: Insufficient documentation

## 2014-04-28 ENCOUNTER — Ambulatory Visit: Payer: BC Managed Care – PPO | Admitting: Physical Therapy

## 2014-04-28 DIAGNOSIS — Z5189 Encounter for other specified aftercare: Secondary | ICD-10-CM | POA: Diagnosis not present

## 2014-05-02 ENCOUNTER — Encounter: Payer: BC Managed Care – PPO | Admitting: Neurology

## 2014-05-03 ENCOUNTER — Ambulatory Visit: Payer: BC Managed Care – PPO | Admitting: Physical Therapy

## 2014-05-03 DIAGNOSIS — Z5189 Encounter for other specified aftercare: Secondary | ICD-10-CM | POA: Diagnosis not present

## 2014-05-05 ENCOUNTER — Ambulatory Visit: Payer: BC Managed Care – PPO | Admitting: Physical Therapy

## 2014-05-05 ENCOUNTER — Other Ambulatory Visit (INDEPENDENT_AMBULATORY_CARE_PROVIDER_SITE_OTHER): Payer: BC Managed Care – PPO

## 2014-05-05 ENCOUNTER — Telehealth: Payer: Self-pay | Admitting: Family Medicine

## 2014-05-05 DIAGNOSIS — Z0000 Encounter for general adult medical examination without abnormal findings: Secondary | ICD-10-CM

## 2014-05-05 DIAGNOSIS — Z5189 Encounter for other specified aftercare: Secondary | ICD-10-CM | POA: Diagnosis not present

## 2014-05-05 LAB — HEPATIC FUNCTION PANEL
ALT: 17 U/L (ref 0–35)
AST: 24 U/L (ref 0–37)
Albumin: 3.4 g/dL — ABNORMAL LOW (ref 3.5–5.2)
Alkaline Phosphatase: 172 U/L — ABNORMAL HIGH (ref 39–117)
Bilirubin, Direct: 0.1 mg/dL (ref 0.0–0.3)
Total Bilirubin: 0.5 mg/dL (ref 0.2–1.2)
Total Protein: 6.8 g/dL (ref 6.0–8.3)

## 2014-05-05 LAB — LIPID PANEL
Cholesterol: 166 mg/dL (ref 0–200)
HDL: 66.3 mg/dL (ref >39.00)
LDL Cholesterol: 79 mg/dL (ref 0–99)
NonHDL: 99.7
Total CHOL/HDL Ratio: 3
Triglycerides: 102 mg/dL (ref 0.0–149.0)
VLDL: 20.4 mg/dL (ref 0.0–40.0)

## 2014-05-05 LAB — CBC WITH DIFFERENTIAL/PLATELET
Basophils Absolute: 0 10*3/uL (ref 0.0–0.1)
Basophils Relative: 0.5 % (ref 0.0–3.0)
Eosinophils Absolute: 0.1 10*3/uL (ref 0.0–0.7)
Eosinophils Relative: 1.8 % (ref 0.0–5.0)
HCT: 41.6 % (ref 36.0–46.0)
Hemoglobin: 13.8 g/dL (ref 12.0–15.0)
Lymphocytes Relative: 34.4 % (ref 12.0–46.0)
Lymphs Abs: 2.8 10*3/uL (ref 0.7–4.0)
MCHC: 33 g/dL (ref 30.0–36.0)
MCV: 89 fl (ref 78.0–100.0)
Monocytes Absolute: 0.8 10*3/uL (ref 0.1–1.0)
Monocytes Relative: 9.7 % (ref 3.0–12.0)
Neutro Abs: 4.3 10*3/uL (ref 1.4–7.7)
Neutrophils Relative %: 53.6 % (ref 43.0–77.0)
Platelets: 293 10*3/uL (ref 150.0–400.0)
RBC: 4.67 Mil/uL (ref 3.87–5.11)
RDW: 13 % (ref 11.5–15.5)
WBC: 8.1 10*3/uL (ref 4.0–10.5)

## 2014-05-05 LAB — BASIC METABOLIC PANEL
BUN: 13 mg/dL (ref 6–23)
CO2: 25 mEq/L (ref 19–32)
Calcium: 9 mg/dL (ref 8.4–10.5)
Chloride: 101 mEq/L (ref 96–112)
Creatinine, Ser: 0.6 mg/dL (ref 0.4–1.2)
GFR: 109.26 mL/min (ref >60.00)
Glucose, Bld: 157 mg/dL — ABNORMAL HIGH (ref 70–99)
Potassium: 4.3 mEq/L (ref 3.5–5.1)
Sodium: 137 mEq/L (ref 135–145)

## 2014-05-05 LAB — TSH: TSH: 4.48 u[IU]/mL (ref 0.35–4.50)

## 2014-05-05 MED ORDER — DULOXETINE HCL 30 MG PO CPEP: 30.0000 mg | ORAL_CAPSULE | Freq: Every day | ORAL | Status: DC

## 2014-05-05 NOTE — Telephone Encounter (Signed)
Medication refilled

## 2014-05-05 NOTE — Telephone Encounter (Signed)
[id number] [place], [place] is requesting re-fill on DULoxetine (CYMBALTA) 30 MG capsule

## 2014-05-06 ENCOUNTER — Ambulatory Visit: Payer: BC Managed Care – PPO | Admitting: Neurology

## 2014-05-10 ENCOUNTER — Ambulatory Visit (INDEPENDENT_AMBULATORY_CARE_PROVIDER_SITE_OTHER): Payer: BC Managed Care – PPO | Admitting: Neurology

## 2014-05-10 ENCOUNTER — Encounter: Payer: Self-pay | Admitting: Neurology

## 2014-05-10 ENCOUNTER — Ambulatory Visit: Payer: BC Managed Care – PPO | Admitting: Family Medicine

## 2014-05-10 ENCOUNTER — Other Ambulatory Visit: Payer: Self-pay | Admitting: Neurology

## 2014-05-10 ENCOUNTER — Ambulatory Visit: Payer: BC Managed Care – PPO | Admitting: Physical Therapy

## 2014-05-10 ENCOUNTER — Encounter: Payer: BC Managed Care – PPO | Admitting: Physical Therapy

## 2014-05-10 VITALS — BP 128/86 | HR 89 | Wt 239.0 lb

## 2014-05-10 DIAGNOSIS — H02401 Unspecified ptosis of right eyelid: Secondary | ICD-10-CM

## 2014-05-10 DIAGNOSIS — Z5189 Encounter for other specified aftercare: Secondary | ICD-10-CM | POA: Diagnosis not present

## 2014-05-10 NOTE — Patient Instructions (Signed)
1.  Referral to [facility] for second opinion 2.  Discontinue mestinon 3.  Check blood testing 4.  Return to clinic in 54-month

## 2014-05-10 NOTE — Progress Notes (Signed)
Follow-up Visit   Date: 05/10/2014    [name] MRN: [id number] DOB: 1951-04-18   Interim History: [name] is a 63 y.o. right-handed Caucasian female with history of hyperlipidemia, GERD, IBS, allergies, and migraines returning to the clinic for follow-up of right ptosis.  The patient was accompanied to the clinic by husband who also provides collateral information.    History of present illness: Since around May 2015, patient noticed that her right eye would become droopy and difficult to open at times. She noticed that when she was watching TV, her left eye would be open, but right eye was closed. She reports having rare episodes of double vision, lasting 10-15 seconds. She is unaware if it involved both eyes. Also over the past several weeks, she has noticed difficulty swallowing liquids/solids occuring several times per week. She finds herself having coughing spells at times. She denies eye pain, slurred speech, shortness of breath, or limb weakness. She has been sleeping on 2 pillows for a number of years. She was evaluated by her eye care physician, Dr. Gershon Crane in July 2015 who referred her for evaluation of intermittent right ptosis.  Around July she developed sinus infection and was started on prednisone and antibiotics, but noticed that her migraines worsened. Typically, she would get migraine every 13-month, but since her URI she has been migraines 3 per week. Pain is throbbing and involves the left side. She has visual aura, described as "dots". She has associated nausea, photophobia, and phonophobia. Duration is 8 hours. Imitrex usually resolves her pain, but she has been taking more frequently. This week, headaches are better and she has not taken imitrex.   UPDATE 03/22/2014:  She continues to have intermittent spells of difficulty swallowing liquids/solids, right eye ptosis.  She notices it worse in the morning.  No double vision or shortness of breath. She is  here to discuss her EMG results which does not show any evidence of a neuromuscular junction disorder.  UPDATE 05/10/2014:  At her last visit, recommended a trial of mestinon but she did not notice any improvement.  Symptoms remain intermittent and she has now also noticed intermittent incomplete closure of the left eye now.    Medications:  Current Outpatient Prescriptions on File Prior to Visit  Medication Sig Dispense Refill  . atorvastatin (LIPITOR) 40 MG tablet Take on Tuesday and Friday 1 pill at night time. 30 tablet 11  . azelastine (ASTELIN) 137 MCG/SPRAY nasal spray Place 1 spray into the nose 2 (two) times daily.     . calcium-vitamin D (OSCAL WITH D) 500-200 MG-UNIT per tablet Take 1 tablet by mouth daily.    . cyclobenzaprine (FLEXERIL) 5 MG tablet Take one tablet twice daily as needed for headaches.  Try to limit to twice per week. 30 tablet 3  . DULoxetine (CYMBALTA) 30 MG capsule Take 1 capsule (30 mg total) by mouth daily. 90 capsule 0  . KLS ALLERCLEAR D-24HR 10-240 MG per 24 hr tablet TAKE 1 TABLET BY MOUTH ONCE DAILY 30 tablet 11  . metoCLOPramide (REGLAN) 10 MG tablet TAKE 1/2 TABLET (5 MG TOTAL) BY MOUTH 4 (FOUR) TIMES DAILY. 60 tablet 5  . nabumetone (RELAFEN) 750 MG tablet TAKE 2 TABLETS EVERY DAY 60 tablet 2  . NASONEX 50 MCG/ACT nasal spray Place 2 sprays into the nose daily.     . Omeprazole ([facility] OMEPRAZOLE) 20 MG TBEC Take 1 tablet (20 mg total) by mouth daily. 42 tablet 6  .  psyllium (METAMUCIL SMOOTH TEXTURE) 28 % packet Take 1 packet by mouth 2 (two) times daily.     [name] pyridostigmine (MESTINON) 60 MG tablet Take 1 tablet (60 mg total) by mouth 2 (two) times daily. 60 tablet 3  . SUMAtriptan (IMITREX) 100 MG tablet TAKE 1 TABLET BY MOUTH EVERY 2 HOURS AS NEEDED FOR MIGRAINE 9 tablet 3   No current facility-administered medications on file prior to visit.    Allergies:  Allergies  Allergen Reactions  . Penicillins     REACTION: rash     Review of Systems:   CONSTITUTIONAL: No fevers, chills, night sweats, or weight loss.   EYES: No visual changes or eye pain ENT: No hearing changes.  No history of nose bleeds.   RESPIRATORY: No cough, wheezing and shortness of breath.   CARDIOVASCULAR: Negative for chest pain, and palpitations.   GI: Negative for abdominal discomfort, blood in stools or black stools.  No recent change in bowel habits.   GU:  No history of incontinence.   MUSCLOSKELETAL: No history of joint pain or swelling.  No myalgias.   SKIN: Negative for lesions, rash, and itching.   ENDOCRINE: Negative for cold or heat intolerance, polydipsia or goiter.   PSYCH:  No depression or anxiety symptoms.   NEURO: As Above.   Vital Signs:  BP 128/86 mmHg  Pulse 89  Wt 239 lb (108.41 kg)  SpO2 98%  Neurological Exam: MENTAL STATUS including orientation to time, place, person, recent and remote memory, attention span and concentration, language, and fund of knowledge is normal.  Speech is not dysarthric.  CRANIAL NERVES:  Pupils equal round and reactive to light.  Normal conjugate, extra-ocular eye movements in all directions of gaze.  Right ptosis with lid lag and mild asymmetry with blink.  There is mild flattening of the right nasolabial fold.  MOTOR:  Motor strength is 5/5 in all extremities.  Tone is normal.    MSRs:  Reflexes are 2+/4 throughout.   COORDINATION/GAIT:   Gait narrow based and stable.   Data: EMG 03/21/2014: This is a normal study of the right upper and lower extremity. In particular, there is no evidence of a neuromuscular junction disorder, generalized myopathy, or cervical/lumbosacral radiculopathy affecting the right side.   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 Her exam shows intermittent  right ptosis with lid lag on that side. There is also mild flattening of the right nasolabial fold, however smile is symmetric and there is no weakness of the facial muscles. No history of Bell's palsy.  Testing for myasthenia gravis including AChR binding, blocking, and modulating antibodies returned negative and her EMG with repetitive nerve stimulation was normal.  I explained that the work-up to date does not clearly define an etiology.  Trial of mestinon did not make any difference in her symptoms.   At this juncture, I recommend second opinion.  She is not interested in having SFEMG at this time. Other considerations include oculopharyngeal muscular dystrophy or blepharospasm, but it would not explain her dysphagia or intermittent diplopia.  Check MUSK antibody  Discontinue mestinon given no benefit  Return to clinic in 67-month, or sooner as needed.   The duration of this appointment visit was 25 minutes of face-to-face time with the patient.  Greater than 50% of this time was  spent in counseling, explanation of diagnosis, planning of further management, and coordination of care.   Thank you for allowing me to participate in patient's care.  If I can answer any additional questions, I would be pleased to do so.    Sincerely,    Tilley Faeth K. Posey Pronto, DO

## 2014-05-11 ENCOUNTER — Telehealth: Payer: Self-pay | Admitting: Family Medicine

## 2014-05-11 LAB — ATHENA MAIL

## 2014-05-11 MED ORDER — SUMATRIPTAN SUCCINATE 100 MG PO TABS: ORAL_TABLET | ORAL | Status: DC

## 2014-05-11 NOTE — Progress Notes (Signed)
Note faxed.  Appointment is with Dr. Dione Booze on February 5 at 11:00.  Patient has been notified and will receive new patient packet from [place].

## 2014-05-11 NOTE — Telephone Encounter (Signed)
Is this ok to refill?  

## 2014-05-11 NOTE — Telephone Encounter (Signed)
Medication refilled

## 2014-05-11 NOTE — Telephone Encounter (Signed)
yes

## 2014-05-11 NOTE — Telephone Encounter (Signed)
[id number] [place], [place] [phone] is requesting re-fill on SUMAtriptan (IMITREX) 100 MG tablet

## 2014-05-12 ENCOUNTER — Encounter: Payer: BC Managed Care – PPO | Admitting: Physical Therapy

## 2014-05-12 ENCOUNTER — Encounter: Payer: Self-pay | Admitting: Family Medicine

## 2014-05-12 ENCOUNTER — Ambulatory Visit (INDEPENDENT_AMBULATORY_CARE_PROVIDER_SITE_OTHER): Payer: BC Managed Care – PPO | Admitting: Family Medicine

## 2014-05-12 VITALS — BP 122/86 | Temp 99.0°F | Ht 65.25 in | Wt 237.8 lb

## 2014-05-12 DIAGNOSIS — K219 Gastro-esophageal reflux disease without esophagitis: Secondary | ICD-10-CM

## 2014-05-12 DIAGNOSIS — E119 Type 2 diabetes mellitus without complications: Secondary | ICD-10-CM | POA: Insufficient documentation

## 2014-05-12 DIAGNOSIS — I350 Nonrheumatic aortic (valve) stenosis: Secondary | ICD-10-CM | POA: Insufficient documentation

## 2014-05-12 DIAGNOSIS — L708 Other acne: Secondary | ICD-10-CM

## 2014-05-12 DIAGNOSIS — G43109 Migraine with aura, not intractable, without status migrainosus: Secondary | ICD-10-CM

## 2014-05-12 DIAGNOSIS — R011 Cardiac murmur, unspecified: Secondary | ICD-10-CM

## 2014-05-12 DIAGNOSIS — L709 Acne, unspecified: Secondary | ICD-10-CM | POA: Insufficient documentation

## 2014-05-12 DIAGNOSIS — M5442 Lumbago with sciatica, left side: Secondary | ICD-10-CM

## 2014-05-12 DIAGNOSIS — E785 Hyperlipidemia, unspecified: Secondary | ICD-10-CM

## 2014-05-12 DIAGNOSIS — Z23 Encounter for immunization: Secondary | ICD-10-CM

## 2014-05-12 LAB — HEMOGLOBIN A1C: Hgb A1c MFr Bld: 6.7 % — ABNORMAL HIGH (ref 4.6–6.5)

## 2014-05-12 NOTE — Progress Notes (Signed)
Garret Reddish, MD Phone: [phone]  Subjective:   [name] is a 63 y.o. year old very pleasant female patient who presents with the following:  Low back pain Pain level seems to be better standing at kitchen sink. Pain up to 7/10 at its worse but in between etreme pain it is tolerable. Worse with bending. PT with traction, exercises, TENS unit with little relief. Taking relafen once a day as it makes her sleepy as well as ibuprofen. Tramadol has not been beneficial in the past. Has some hydrocodone at home after knee replacement (makes her "loopy"). Discussed using 1/2 tab possibly (cannot drive for 6 hours after) Dr. [name] December 10th.   Regarding weight doing some exercise at home. Walking any length of time causes pain.   ROS- No weakness in legs. Occasional numbness in legs.   Migraines - controlled. once a week relieved with sumatriptan.  ROS- no worsening headaches or blurry vision  Hyperlipidemia-maintained control with transition to atorvastatin  Lab Results  Component Value Date   LDLCALC 79 05/05/2014  On statin: yes recently changed from crestor to atorvastatin and takes twice weekly Regular exercise: no due to LBP Diet: admits to many poor choices ROS- no shortness of breath. No myalgias.   Hyperglycemia Elevated to 157 fasting recently. A lot of bad foods in house husband is bring in like fresh muffin. Has started trying to clear out  GERD Reflux for many years. States mild difficulty with pills now which is abnormal. Very rare trouble with liquids. Will feel like things get "hung"  Past Medical History- Patient Active Problem List   Diagnosis Date Noted  . Diabetes mellitus type 2, controlled 05/12/2014    Priority: High  . Generalized anxiety disorder 03/10/2014    Priority: Medium  . Migraines 03/10/2014    Priority: Medium  . Irritable bowel syndrome 12/15/2008    Priority: Medium  . OBESITY, MORBID 10/12/2008    Priority: Medium  .  Hyperlipidemia 02/09/2008    Priority: Medium  . Acne 05/12/2014    Priority: Low  . GERD (gastroesophageal reflux disease) 03/10/2014    Priority: Low  . Acute sinusitis 08/08/2012    Priority: Low  . EUSTACHIAN TUBE DYSFUNCTION, CHRONIC 06/14/2010    Priority: Low  . Low back pain 08/11/2008    Priority: Low  . ALLERGIC RHINITIS 02/12/2007    Priority: Low  . ASTHMA 02/12/2007    Priority: Low   Medications- reviewed and updated Current Outpatient Prescriptions  Medication Sig Dispense Refill  . atorvastatin (LIPITOR) 40 MG tablet Take on Tuesday and Friday 1 pill at night time. 30 tablet 11  . azelastine (ASTELIN) 137 MCG/SPRAY nasal spray Place 1 spray into the nose 2 (two) times daily.     . calcium-vitamin D (OSCAL WITH D) 500-200 MG-UNIT per tablet Take 1 tablet by mouth daily.    . [facility]S OMEPRAZOLE 20.6 (20 BASE) MG CPDR Take 1 tablet by mouth daily.  6  . cyclobenzaprine (FLEXERIL) 5 MG tablet Take one tablet twice daily as needed for headaches.  Try to limit to twice per week. 30 tablet 3  . doxycycline (VIBRAMYCIN) 100 MG capsule     . DULoxetine (CYMBALTA) 30 MG capsule Take 1 capsule (30 mg total) by mouth daily. 90 capsule 0  . estradiol (ESTRACE) 1 MG tablet   3  . fluticasone (FLONASE) 50 MCG/ACT nasal spray     . KLS ALLERCLEAR D-24HR 10-240 MG per 24 hr tablet TAKE 1 TABLET  BY MOUTH ONCE DAILY 30 tablet 11  . metoCLOPramide (REGLAN) 10 MG tablet TAKE 1/2 TABLET (5 MG TOTAL) BY MOUTH 4 (FOUR) TIMES DAILY. 60 tablet 5  . nabumetone (RELAFEN) 750 MG tablet TAKE 2 TABLETS EVERY DAY 60 tablet 2  . psyllium (METAMUCIL SMOOTH TEXTURE) 28 % packet Take 1 packet by mouth 2 (two) times daily.     . SUMAtriptan (IMITREX) 100 MG tablet TAKE 1 TABLET BY MOUTH EVERY 2 HOURS AS NEEDED FOR MIGRAINE 9 tablet 3   No current facility-administered medications for this visit.    Objective: BP 122/86 mmHg  Temp(Src) 99 F (37.2 C) (Oral)  Ht 5' 5.25" (1.657 m)  Wt 237 lb 12.8 oz  (107.865 kg)  BMI 39.29 kg/m2 Gen: NAD, resting comfortably HEENT: Oropharynx normal. Mucous membranes are moist. CV: RRR 3/6 SEM (cannot hear its radiation to carotids as clearly) Lungs: CTAB no crackles, wheeze, rhonchi Abdomen: soft/nontender/nondistended/normal bowel sounds.  Ext: no edema.  Skin: warm, dry, no rash Neuro: grossly normal, moves all extremities   Assessment/Plan:  Low back pain Difficult to control. Not improved by PT. Sees Dr. [name] December 10th. Discussed with patient trialing 1/2 a vicodin which she has at home as a full pill seems to make her "loopy".    Migraines Sumatriptan. Freq 1x a week-controlled with med. Discussed if increased frequency-plan to discuss prophylaxis  Hyperlipidemia We reviewed labs and good control on atorvastatin 20 mg twice weekly instead Crestor  Diabetes mellitus type 2, controlled My concern with hyperglycemia on last labs lead to an A1c today. A1c elevated at 6.7 leading to diagnosis of diabetes. It is currently controlled despite no exercise and poor food choices. Counseled patient on need to control portion size, cut down on sweets, and we will repeat A1c in 3 months. I would love for her to exercise but this is limited by back pain  GERD (gastroesophageal reflux disease) Given dysphagia even with liquids at time I referred her to her gastroenterologist. They will need to consider EGD.    Systolic ejection murmur Noted on last 2 visits, we will send for echocardiogram   Orders Placed This Encounter  Procedures  . Varicella-zoster vaccine subcutaneous  . Hemoglobin A1c  . 2D Echocardiogram without contrast    Standing Status: Future     Number of Occurrences:      Standing Expiration Date: 05/13/2015    Scheduling Instructions:     Systolic murmur    Order Specific Question:  Type of Echo    Answer:  Complete    Order Specific Question:  Where should this test be performed    Answer:  [facility]  [facility])    Order Specific Question:  Reason for exam-Echo    Answer:  Other - See Comments Section

## 2014-05-12 NOTE — Assessment & Plan Note (Signed)
My concern with hyperglycemia on last labs lead to an A1c today. A1c elevated at 6.7 leading to diagnosis of diabetes. It is currently controlled despite no exercise and poor food choices. Counseled patient on need to control portion size, cut down on sweets, and we will repeat A1c in 3 months. I would love for her to exercise but this is limited by back pain

## 2014-05-12 NOTE — Assessment & Plan Note (Signed)
Given dysphagia even with liquids at time I referred her to her gastroenterologist. They will need to consider EGD.

## 2014-05-12 NOTE — Patient Instructions (Addendum)
Alisha Hyperglycemia- X4J Systolic murmur- 2d echo cardiogram  Shingles vaccine  Ms. Garver They will call you with appointment for echo for new murmur  I will send you a message about a1c. We are ok without meds if we can remain below 7. Recommend you exercise as much as possible even if just walking in place for 2 minute bursts every 30 minutes. Avoid overindulging in sweets. Whole wheat instead of white breat/pasta.   Please call Dr. [name] and get in to discuss that feeling of things getting stuck

## 2014-05-12 NOTE — Assessment & Plan Note (Signed)
Sumatriptan. Freq 1x a week-controlled with med. Discussed if increased frequency-plan to discuss prophylaxis

## 2014-05-12 NOTE — Assessment & Plan Note (Signed)
We reviewed labs and good control on atorvastatin 20 mg twice weekly instead Crestor

## 2014-05-12 NOTE — Assessment & Plan Note (Signed)
Difficult to control. Not improved by PT. Sees Dr. [name] December 10th. Discussed with patient trialing 1/2 a vicodin which she has at home as a full pill seems to make her "loopy".

## 2014-05-12 NOTE — Progress Notes (Signed)
Pre visit review using our clinic review tool, if applicable. No additional management support is needed unless otherwise documented below in the visit note. 

## 2014-05-12 NOTE — Assessment & Plan Note (Signed)
Noted on last 2 visits, we will send for echocardiogram

## 2014-05-13 ENCOUNTER — Ambulatory Visit: Payer: BC Managed Care – PPO | Admitting: Physical Therapy

## 2014-05-13 ENCOUNTER — Ambulatory Visit: Payer: BC Managed Care – PPO | Admitting: Family Medicine

## 2014-05-13 DIAGNOSIS — Z5189 Encounter for other specified aftercare: Secondary | ICD-10-CM | POA: Diagnosis not present

## 2014-05-16 ENCOUNTER — Telehealth: Payer: Self-pay | Admitting: Family Medicine

## 2014-05-16 DIAGNOSIS — E119 Type 2 diabetes mellitus without complications: Secondary | ICD-10-CM

## 2014-05-16 NOTE — Telephone Encounter (Signed)
Pt would like a referral to [facility] for dm

## 2014-05-17 ENCOUNTER — Ambulatory Visit: Payer: BC Managed Care – PPO | Admitting: Physical Therapy

## 2014-05-17 DIAGNOSIS — Z5189 Encounter for other specified aftercare: Secondary | ICD-10-CM | POA: Diagnosis not present

## 2014-05-17 NOTE — Telephone Encounter (Signed)
Is this ok to enter in?

## 2014-05-17 NOTE — Telephone Encounter (Signed)
yes

## 2014-05-17 NOTE — Telephone Encounter (Signed)
Referral entered in

## 2014-05-20 ENCOUNTER — Ambulatory Visit (HOSPITAL_COMMUNITY): Payer: BC Managed Care – PPO | Attending: Cardiovascular Disease | Admitting: Radiology

## 2014-05-20 DIAGNOSIS — R011 Cardiac murmur, unspecified: Secondary | ICD-10-CM | POA: Insufficient documentation

## 2014-05-20 DIAGNOSIS — E785 Hyperlipidemia, unspecified: Secondary | ICD-10-CM | POA: Insufficient documentation

## 2014-05-20 DIAGNOSIS — E119 Type 2 diabetes mellitus without complications: Secondary | ICD-10-CM | POA: Diagnosis not present

## 2014-05-20 DIAGNOSIS — E669 Obesity, unspecified: Secondary | ICD-10-CM | POA: Diagnosis not present

## 2014-05-20 NOTE — Progress Notes (Signed)
Echocardiogram performed.  

## 2014-05-23 ENCOUNTER — Encounter: Payer: Self-pay | Admitting: Family Medicine

## 2014-05-24 ENCOUNTER — Ambulatory Visit: Payer: BC Managed Care – PPO | Attending: Physical Medicine and Rehabilitation | Admitting: Physical Therapy

## 2014-05-24 ENCOUNTER — Telehealth: Payer: Self-pay | Admitting: Neurology

## 2014-05-24 DIAGNOSIS — Z5189 Encounter for other specified aftercare: Secondary | ICD-10-CM | POA: Diagnosis present

## 2014-05-24 DIAGNOSIS — M545 Low back pain: Secondary | ICD-10-CM | POA: Diagnosis not present

## 2014-05-24 DIAGNOSIS — J45909 Unspecified asthma, uncomplicated: Secondary | ICD-10-CM | POA: Diagnosis not present

## 2014-05-24 DIAGNOSIS — Z96612 Presence of left artificial shoulder joint: Secondary | ICD-10-CM | POA: Diagnosis not present

## 2014-05-24 DIAGNOSIS — Z96651 Presence of right artificial knee joint: Secondary | ICD-10-CM | POA: Insufficient documentation

## 2014-05-24 NOTE — Telephone Encounter (Signed)
Labs rec'd from [facility] dated 05/10/2014:  MUSK antibody negative.    Caryl Pina, please let patient know her antibody testing for the other myasthenia test returned negative.  Arlene Brickel K. Posey Pronto, DO

## 2014-05-24 NOTE — Telephone Encounter (Signed)
Patient given results

## 2014-05-26 ENCOUNTER — Ambulatory Visit: Payer: BC Managed Care – PPO | Admitting: Physical Therapy

## 2014-05-26 DIAGNOSIS — Z5189 Encounter for other specified aftercare: Secondary | ICD-10-CM | POA: Diagnosis not present

## 2014-05-31 ENCOUNTER — Ambulatory Visit: Payer: BC Managed Care – PPO | Admitting: Physical Therapy

## 2014-05-31 ENCOUNTER — Encounter: Payer: BC Managed Care – PPO | Attending: Family Medicine

## 2014-05-31 VITALS — Ht 66.0 in | Wt 237.2 lb

## 2014-05-31 DIAGNOSIS — Z713 Dietary counseling and surveillance: Secondary | ICD-10-CM | POA: Insufficient documentation

## 2014-05-31 DIAGNOSIS — E119 Type 2 diabetes mellitus without complications: Secondary | ICD-10-CM

## 2014-05-31 DIAGNOSIS — Z5189 Encounter for other specified aftercare: Secondary | ICD-10-CM | POA: Diagnosis not present

## 2014-06-02 ENCOUNTER — Telehealth: Payer: Self-pay | Admitting: Family Medicine

## 2014-06-02 ENCOUNTER — Other Ambulatory Visit: Payer: Self-pay | Admitting: Family Medicine

## 2014-06-02 ENCOUNTER — Ambulatory Visit: Payer: BC Managed Care – PPO | Admitting: Physical Therapy

## 2014-06-02 DIAGNOSIS — E119 Type 2 diabetes mellitus without complications: Secondary | ICD-10-CM

## 2014-06-02 DIAGNOSIS — Z5189 Encounter for other specified aftercare: Secondary | ICD-10-CM | POA: Diagnosis not present

## 2014-06-02 NOTE — Telephone Encounter (Signed)
Pt called to say Dr Yong Channel wanted her to have her  A1C checked in 3 months which would be in Feb. I scheduled pt for the lab appt will you put the lad order in please .[name]

## 2014-06-02 NOTE — Telephone Encounter (Signed)
Lab order entered.

## 2014-06-03 NOTE — Progress Notes (Signed)

## 2014-06-06 ENCOUNTER — Encounter: Payer: Self-pay | Admitting: Neurology

## 2014-06-07 ENCOUNTER — Ambulatory Visit: Payer: BC Managed Care – PPO | Admitting: Physical Therapy

## 2014-06-07 DIAGNOSIS — Z5189 Encounter for other specified aftercare: Secondary | ICD-10-CM | POA: Diagnosis not present

## 2014-06-07 DIAGNOSIS — E119 Type 2 diabetes mellitus without complications: Secondary | ICD-10-CM

## 2014-06-07 NOTE — Progress Notes (Signed)

## 2014-06-09 ENCOUNTER — Other Ambulatory Visit: Payer: Self-pay | Admitting: Family Medicine

## 2014-06-09 ENCOUNTER — Ambulatory Visit: Payer: BC Managed Care – PPO | Admitting: Physical Therapy

## 2014-06-13 ENCOUNTER — Ambulatory Visit: Payer: BC Managed Care – PPO | Admitting: Physical Therapy

## 2014-06-13 ENCOUNTER — Telehealth: Payer: Self-pay

## 2014-06-13 DIAGNOSIS — Z5189 Encounter for other specified aftercare: Secondary | ICD-10-CM | POA: Diagnosis not present

## 2014-06-13 NOTE — Telephone Encounter (Signed)
Received a fax from [facility] stating pt is requesting duloxetine HCL DR 30 mg capsule  Per pharmacy pt states she takes this medication 3 times daily.   Pls advise and send in medication.

## 2014-06-13 NOTE — Telephone Encounter (Signed)
Dr. Hunter see below 

## 2014-06-13 NOTE — Telephone Encounter (Signed)
Yes appears is 3x a day. Please update and send in [place]. 6 months worth at least please Can also fill reglan

## 2014-06-13 NOTE — Telephone Encounter (Signed)
Rx request for metoclopramide 10 mg tablet- Take 1/2 tablet by mouth 4 times daily #60

## 2014-06-14 DIAGNOSIS — E119 Type 2 diabetes mellitus without complications: Secondary | ICD-10-CM

## 2014-06-14 MED ORDER — METOCLOPRAMIDE HCL 10 MG PO TABS: ORAL_TABLET | ORAL | Status: DC

## 2014-06-14 MED ORDER — DULOXETINE HCL 30 MG PO CPEP: 30.0000 mg | ORAL_CAPSULE | Freq: Three times a day (TID) | ORAL | Status: DC

## 2014-06-14 NOTE — Progress Notes (Signed)
Patient was seen on 06/14/14 for the third of a series of three diabetes self-management courses at [facility]. The following learning objectives were met by the patient during this class:  . State the amount of activity recommended for healthy living . Describe activities suitable for individual needs . Identify ways to regularly incorporate activity into daily life . Identify barriers to activity and ways to over come these barriers  Identify diabetes medications being personally used and their primary action for lowering glucose and possible side effects . Describe role of stress on blood glucose and develop strategies to address psychosocial issues . Identify diabetes complications and ways to prevent them  Explain how to manage diabetes during illness . Evaluate success in meeting personal goal . Establish 2-3 goals that they will plan to diligently work on until they return for the  43-monthfollow-up visit  Goals:   I will count my carb choices at most meals and snacks  I will be active  3 times a week  I will eat less unhealthy fats   To help manage stress I will  exercise at least 3 times a week  Your patient has identified these potential barriers to change:  Motivation  Your patient has identified their diabetes self-care support plan as  [facility] Family Support Magazine Subscriptions  Plan:  Attend Core 4 in 4 months

## 2014-06-14 NOTE — Telephone Encounter (Signed)
Medication refilled

## 2014-06-21 ENCOUNTER — Ambulatory Visit: Payer: BC Managed Care – PPO | Admitting: Physical Therapy

## 2014-06-21 DIAGNOSIS — Z5189 Encounter for other specified aftercare: Secondary | ICD-10-CM | POA: Diagnosis not present

## 2014-07-05 ENCOUNTER — Encounter: Payer: Self-pay | Admitting: Internal Medicine

## 2014-07-05 ENCOUNTER — Ambulatory Visit (INDEPENDENT_AMBULATORY_CARE_PROVIDER_SITE_OTHER): Payer: BC Managed Care – PPO | Admitting: Internal Medicine

## 2014-07-05 VITALS — BP 140/84 | HR 76 | Ht 66.0 in | Wt 233.0 lb

## 2014-07-05 DIAGNOSIS — K219 Gastro-esophageal reflux disease without esophagitis: Secondary | ICD-10-CM

## 2014-07-05 DIAGNOSIS — Z86010 Personal history of colonic polyps: Secondary | ICD-10-CM

## 2014-07-05 DIAGNOSIS — K589 Irritable bowel syndrome without diarrhea: Secondary | ICD-10-CM

## 2014-07-05 DIAGNOSIS — R1314 Dysphagia, pharyngoesophageal phase: Secondary | ICD-10-CM

## 2014-07-05 MED ORDER — MOVIPREP 100 G PO SOLR: 1.0000 | Freq: Once | ORAL | Status: DC

## 2014-07-05 NOTE — Patient Instructions (Signed)
You have been scheduled for an endoscopy and colonoscopy. Please follow the written instructions given to you at your visit today. Please pick up your prep at the pharmacy within the next 1-3 days. If you use inhalers (even only as needed), please bring them with you on the day of your procedure. Your physician has requested that you go to www.startemmi.com and enter the access code given to you at your visit today. This web site gives a general overview about your procedure. However, you should still follow specific instructions given to you by our office regarding your preparation for the procedure.  

## 2014-07-05 NOTE — Progress Notes (Signed)
HISTORY OF PRESENT ILLNESS:  [name] is a 64 y.o. female history of GERD, IBS, anxiety, hyperlipidemia, asthma, and adenomatous colon polyps. Previously evaluated in this office for atypical chest pain, tonic left-sided abdominal discomfort, and gastroparesis. She was last evaluated 01/01/2012. She was doing well on medical therapy. See that dictation. She presents today for follow-up regarding a new complaint of dysphagia. She reports intermittent solid food dysphagia of about 4 months duration. Some breakthrough reflux symptoms on once daily over-the-counter omeprazole. No weight loss. No significant problems with nausea. Continues on metoclopramide 5 mg 4 times daily. She is due for surveillance colonoscopy at this time. GI review of systems is otherwise negative. IBS stable. She did undergo upper endoscopy previously in 2006. This was negative except for benign fundic gland polyps.  REVIEW OF SYSTEMS:  All non-GI ROS negative upon comprehensive review.  Past Medical History  Diagnosis Date  . Allergy   . Hyperlipidemia   . IBS (irritable bowel syndrome)   . Lumbar disc disease   . Colon polyps   . Anxiety   . Asthma     /w resp. upset   . GERD (gastroesophageal reflux disease)   . Headache(784.0)     migraines -h/o  . Arthritis     back & knee  . Gastric polyps   . Gastroparesis     Past Surgical History  Procedure Laterality Date  . Abdominal hysterectomy    . Oophorectomy    . Carpal tunnel release      bilateral   . Esophagogastroduodenoscopy    . Rotator cuff repair      left   . Lasik    . Breast surgery      augmetation, implants- 1980  . Cholecystectomy      laparoscopic   . Bladder suspension      2007  . Total knee arthroplasty  04/15/2012    RIGHT KNEE  . Knee arthroscopy  04/15/2012    Procedure: ARTHROSCOPY KNEE;  Surgeon: [name], MD;  Location: [facility];  Service: Orthopedics;  Laterality: Right;  . Total knee arthroplasty  04/15/2012     Procedure: TOTAL KNEE ARTHROPLASTY;  Surgeon: [name], MD;  Location: [facility];  Service: Orthopedics;  Laterality: Right;    Social History [name]  reports that she has never smoked. She has never used smokeless tobacco. She reports that she does not drink alcohol or use illicit drugs.  family history includes COPD in her mother; Colon polyps in her maternal aunt and mother; Heart disease in her father; Irritable bowel syndrome in her mother.  Allergies  Allergen Reactions  . Erythromycin   . Penicillins     REACTION: rash       PHYSICAL EXAMINATION: Vital signs: BP 140/84 mmHg  Pulse 76  Ht 5\' 6"  (1.676 m)  Wt 233 lb (105.688 kg)  BMI 37.63 kg/m2 General: Well-developed, well-nourished, no acute distress HEENT: Sclerae are anicteric, conjunctiva pink. Oral mucosa intact Lungs: Clear Heart: Regular Abdomen: soft, obese, nontender, nondistended, no obvious ascites, no peritoneal signs, normal bowel sounds. No organomegaly. No succussion splash Extremities: No edema Psychiatric: alert and oriented x3. Cooperative   ASSESSMENT:  #1. New onset intermittent solid food dysphagia. Suspect peptic stricture #2. GERD. Some breakthrough symptoms on once daily omeprazole OTC #3. History of chronic nausea. No issues at this time #4. IBS. Stable #5. History of adenomatous colon polyps. Last colonoscopy February 2011. Due for surveillance   PLAN:  #  1. Continue PPI #2. Upper endoscopy with possible esophageal dilation.The nature of the procedure, as well as the risks, benefits, and alternatives were carefully and thoroughly reviewed with the patient. Ample time for discussion and questions allowed. The patient understood, was satisfied, and agreed to proceed. #3. Surveillance colonoscopy. The nature of the procedure, as well as the risks, benefits, and alternatives were carefully and thoroughly reviewed with the patient. Ample time for discussion and questions  allowed. The patient understood, was satisfied, and agreed to proceed.

## 2014-07-12 ENCOUNTER — Encounter: Payer: Self-pay | Admitting: Neurology

## 2014-07-28 ENCOUNTER — Encounter: Payer: Self-pay | Admitting: Internal Medicine

## 2014-07-28 ENCOUNTER — Encounter: Payer: BC Managed Care – PPO | Admitting: Internal Medicine

## 2014-07-28 ENCOUNTER — Ambulatory Visit (AMBULATORY_SURGERY_CENTER): Payer: BC Managed Care – PPO | Admitting: Internal Medicine

## 2014-07-28 VITALS — BP 135/55 | HR 76 | Temp 97.9°F | Resp 13 | Ht 66.0 in | Wt 233.0 lb

## 2014-07-28 DIAGNOSIS — R1319 Other dysphagia: Secondary | ICD-10-CM

## 2014-07-28 DIAGNOSIS — R1310 Dysphagia, unspecified: Secondary | ICD-10-CM

## 2014-07-28 DIAGNOSIS — R1314 Dysphagia, pharyngoesophageal phase: Secondary | ICD-10-CM

## 2014-07-28 DIAGNOSIS — K219 Gastro-esophageal reflux disease without esophagitis: Secondary | ICD-10-CM

## 2014-07-28 DIAGNOSIS — Z86010 Personal history of colonic polyps: Secondary | ICD-10-CM

## 2014-07-28 MED ORDER — SODIUM CHLORIDE 0.9 % IV SOLN: 500.0000 mL | INTRAVENOUS | Status: DC

## 2014-07-28 MED ORDER — OMEPRAZOLE 40 MG PO CPDR: 40.0000 mg | DELAYED_RELEASE_CAPSULE | Freq: Every day | ORAL | Status: DC

## 2014-07-28 NOTE — Op Note (Signed)
[place]  [address] [place], [id number]   COLONOSCOPY PROCEDURE REPORT  PATIENT: Hayley, [name]  MR#: [id number] BIRTHDATE: 01/03/1951 , 63  yrs. old GENDER: female ENDOSCOPIST: Eustace Quail, MD REFERRED VO:[id number] Program Recall PROCEDURE DATE:  07/28/2014 PROCEDURE:   Colonoscopy, surveillance First Screening Colonoscopy - Avg.  risk and is 50 yrs.  old or older - No.  Prior Negative Screening - Now for repeat screening. N/A  History of Adenoma - Now for follow-up colonoscopy & has been > or = to 3 yrs.  Yes hx of adenoma.  Has been 3 or more years since last colonoscopy.  Polyps Removed Today? No.  Polyps Removed Today? No.  Recommend repeat exam, <10 yrs? No. ASA CLASS:   Class II INDICATIONS:surveillance colonoscopy based on a history of adenomatous colonic polyp(s). Index exam February 2011 with small adenomas. MEDICATIONS: Monitored anesthesia care and Propofol 300 mg IV  DESCRIPTION OF PROCEDURE:   After the risks benefits and alternatives of the procedure were thoroughly explained, informed consent was obtained.  The digital rectal exam revealed no abnormalities of the rectum.   The LB [id number]  endoscope was introduced through the anus and advanced to the cecum, which was identified by both the appendix and ileocecal valve. No adverse events experienced.   The quality of the prep was good, using MoviPrep  The instrument was then slowly withdrawn as the colon was fully examined.      COLON FINDINGS: A normal appearing cecum, ileocecal valve, and appendiceal orifice were identified.  The ascending, transverse, descending, sigmoid colon, and rectum appeared unremarkable. Retroflexed views revealed no abnormalities. The time to cecum=6 minutes 32 seconds.  Withdrawal time=11 minutes 31 seconds.  The scope was withdrawn and the procedure completed.  COMPLICATIONS: There were no immediate complications.  ENDOSCOPIC  IMPRESSION: 1. Normal colonoscopy  RECOMMENDATIONS: 1.  Continue current colorectal screening recommendations for "routine risk" patients with a repeat colonoscopy in 10 years. 2.  Upper endoscopy today (please see results)  eSigned:  Eustace Quail, MD 07/28/2014 2:57 PM   cc: The Patient    ; Burr Medico.D.

## 2014-07-28 NOTE — Op Note (Signed)
[place]  [address] [place], [address]   ENDOSCOPY PROCEDURE REPORT  PATIENT: [name], [name]  MR#: [id number] BIRTHDATE: Dec 07, 1950 , 63  yrs. old GENDER: female ENDOSCOPIST: Eustace Quail, MD REFERRED BY:  .  Self / Office PROCEDURE DATE:  07/28/2014 PROCEDURE:  EGD, diagnostic and Maloney dilation of esophagus  - 62f  ASA CLASS:     Class II INDICATIONS:  dysphagia, history of esophageal reflux, and therapeutic procedure. MEDICATIONS: Monitored anesthesia care and Propofol 100 mg IV TOPICAL ANESTHETIC: none  DESCRIPTION OF PROCEDURE: After the risks benefits and alternatives of the procedure were thoroughly explained, informed consent was obtained.  The LB [id number] endoscope was introduced through the mouth and advanced to the second portion of the duodenum , Without limitations.  The instrument was slowly withdrawn as the mucosa was fully examined.    EXAM:The esophagus revealed a distal esophageal ring.  Otherwise normal without inflammation.  Stomach revealed benign fundic gland type polyps in the proximal portion, but was otherwise normal.  The duodenum was normal.  Retroflexed views revealed a hiatal hernia. The scope was then withdrawn from the patient and the procedure completed.  THERAPY: 31 French Maloney dilator passed into the esophagus without resistance or heme. Tolerated well  COMPLICATIONS: There were no immediate complications.  ENDOSCOPIC IMPRESSION: 1. GERD with breakthrough symptoms 2. Distal esophageal stricture status post dilation  RECOMMENDATIONS: 1.  Clear liquids until 4 p.m., then soft foods rest of day.  Resume prior diet tomorrow. 2.  Anti-reflux regimen to be followed 3.  Prescribe omeprazole 40 mg daily; #30; 11 refills  REPEAT EXAM:  eSigned:  Eustace Quail, MD 07/28/2014 3:03 PM    CC:The Patient  , Garret Reddish M.D.

## 2014-07-28 NOTE — Patient Instructions (Signed)
Impressions/recommendations:  Normal colonoscopy. Repeat colonoscopy in 10 years.  GERD with breakthrough symptoms. Distal esophageal stricture dilated (dilation diet instructions given)  YOU HAD AN ENDOSCOPIC PROCEDURE TODAY AT [facility]: Refer to the procedure report that was given to you for any specific questions about what was found during the examination.  If the procedure report does not answer your questions, please call your gastroenterologist to clarify.  If you requested that your care partner not be given the details of your procedure findings, then the procedure report has been included in a sealed envelope for you to review at your convenience later.  YOU SHOULD EXPECT: Some feelings of bloating in the abdomen. Passage of more gas than usual.  Walking can help get rid of the air that was put into your GI tract during the procedure and reduce the bloating. If you had a lower endoscopy (such as a colonoscopy or flexible sigmoidoscopy) you may notice spotting of blood in your stool or on the toilet paper. If you underwent a bowel prep for your procedure, then you may not have a normal bowel movement for a few days.  DIET: Your first meal following the procedure should be a light meal and then it is ok to progress to your normal diet.  A half-sandwich or bowl of soup is an example of a good first meal.  Heavy or fried foods are harder to digest and may make you feel nauseous or bloated.  Likewise meals heavy in dairy and vegetables can cause extra gas to form and this can also increase the bloating.  Drink plenty of fluids but you should avoid alcoholic beverages for 24 hours.  ACTIVITY: Your care partner should take you home directly after the procedure.  You should plan to take it easy, moving slowly for the rest of the day.  You can resume normal activity the day after the procedure however you should NOT DRIVE or use heavy machinery for 24 hours (because of the sedation  medicines used during the test).    SYMPTOMS TO REPORT IMMEDIATELY: A gastroenterologist can be reached at any hour.  During normal business hours, 8:30 AM to 5:00 PM Monday through Friday, call [phone].  After hours and on weekends, please call the GI answering service at [phone] who will take a message and have the physician on call contact you.   Following lower endoscopy (colonoscopy or flexible sigmoidoscopy):  Excessive amounts of blood in the stool  Significant tenderness or worsening of abdominal pains  Swelling of the abdomen that is new, acute  Fever of 100F or higher  Following upper endoscopy (EGD)  Vomiting of blood or coffee ground material  New chest pain or pain under the shoulder blades  Painful or persistently difficult swallowing  New shortness of breath  Fever of 100F or higher  Black, tarry-looking stools  FOLLOW UP: If any biopsies were taken you will be contacted by phone or by letter within the next 1-3 weeks.  Call your gastroenterologist if you have not heard about the biopsies in 3 weeks.  Our staff will call the home number listed on your records the next business day following your procedure to check on you and address any questions or concerns that you may have at that time regarding the information given to you following your procedure. This is a courtesy call and so if there is no answer at the home number and we have not heard from you through the emergency  physician on call, we will assume that you have returned to your regular daily activities without incident.  SIGNATURES/CONFIDENTIALITY: You and/or your care partner have signed paperwork which will be entered into your electronic medical record.  These signatures attest to the fact that that the information above on your After Visit Summary has been reviewed and is understood.  Full responsibility of the confidentiality of this discharge information lies with you and/or your  care-partner.

## 2014-07-28 NOTE — Progress Notes (Signed)
Report to PACU, RN, vss, BBS= Clear.  

## 2014-07-29 ENCOUNTER — Telehealth: Payer: Self-pay

## 2014-07-29 NOTE — Telephone Encounter (Signed)
  Follow up Call-  Call back number 07/28/2014  Post procedure Call Back phone  # [phone]  Permission to leave phone message Yes     Patient questions:  Do you have a fever, pain , or abdominal swelling? No. Pain Score  0 *  Have you tolerated food without any problems? Yes.    Have you been able to return to your normal activities? Yes.    Do you have any questions about your discharge instructions: Diet   No. Medications  No. Follow up visit  No.  Do you have questions or concerns about your Care? No.  Actions: * If pain score is 4 or above: No action needed, pain <4.

## 2014-08-11 ENCOUNTER — Ambulatory Visit: Payer: BC Managed Care – PPO | Admitting: Neurology

## 2014-08-15 ENCOUNTER — Other Ambulatory Visit: Payer: BC Managed Care – PPO

## 2014-08-15 ENCOUNTER — Telehealth: Payer: Self-pay | Admitting: Neurology

## 2014-08-15 NOTE — Telephone Encounter (Signed)
Pt canceled appt to see Dr Posey Pronto on 08-11-14 and did not resh

## 2014-08-18 ENCOUNTER — Other Ambulatory Visit (INDEPENDENT_AMBULATORY_CARE_PROVIDER_SITE_OTHER): Payer: BC Managed Care – PPO

## 2014-08-18 ENCOUNTER — Encounter: Payer: Self-pay | Admitting: Family Medicine

## 2014-08-18 DIAGNOSIS — E119 Type 2 diabetes mellitus without complications: Secondary | ICD-10-CM

## 2014-08-18 LAB — HEMOGLOBIN A1C: Hgb A1c MFr Bld: 6.9 % — ABNORMAL HIGH (ref 4.6–6.5)

## 2014-08-24 ENCOUNTER — Telehealth: Payer: Self-pay | Admitting: Neurology

## 2014-08-24 NOTE — Telephone Encounter (Signed)
Received a letter from patient in January stating that her [facility] blood test was not covered by her insurance as it was out of network and she was sent ability of $1620. I did request that my office manager contact the patient and look into it further, which she did.  A month later, I received a copy of a letter stating their grievances addressed to Mr. Deniece Portela, so personally called the patient myself to address their concerns.  I had a 25 minute conversation with patient and her husband to better understand the situation and hand. From what I can gather, [facility] is out of network lab for patient, so her insurance did not cover the cost of the testing, despite it being drawn at [facility] lab. We will recheck to [facility] to see what options are available to settle this.  Patient's husband was somewhat threatening and said that he would get the media and lawyers involved as needed, because he was not going to be paying for it.  I was able to calm calm him and encouraged him that we are all on the same team and will work on together.  He expressed appreciation for the call and my apologies.  Keola Heninger K. Posey Pronto, DO

## 2014-09-01 ENCOUNTER — Encounter: Payer: Self-pay | Admitting: Family Medicine

## 2014-09-01 LAB — HM MAMMOGRAPHY: HM Mammogram: NORMAL

## 2014-09-05 ENCOUNTER — Other Ambulatory Visit: Payer: Self-pay | Admitting: Physician Assistant

## 2014-09-06 ENCOUNTER — Other Ambulatory Visit: Payer: Self-pay | Admitting: Family Medicine

## 2014-09-08 ENCOUNTER — Encounter (HOSPITAL_BASED_OUTPATIENT_CLINIC_OR_DEPARTMENT_OTHER): Payer: Self-pay | Admitting: *Deleted

## 2014-09-08 NOTE — Progress Notes (Signed)
No labs needed

## 2014-09-14 NOTE — H&P (Signed)
[name]/[facility] [address] [place], [place] [address] [phone] A Division of [facility]  [name], M.D.   Robert A. Noemi Chapel, M.D.   Faythe Casa, M.D.   Johnny Bridge, M.D.   Almedia Balls, M.D Ernesta Amble. Percell Miller, M.D.  Joseph Pierini, M.D.  Lanier Prude, M.D.    Verner Chol, M.D. Lovett Calender, PA- C  Mary L. Venida Jarvis, PA-C  Kirstin A. Shepperson, PA-C  Josh [place], PA-C  [place], [place]   RE: Hayley, [name]   [phone]      DOB: 1951/03/12 PROGRESS NOTE: 08-02-14 Breeze comes in with a new issue. Right shoulder and right wrist. Going on the last couple months. It started when she was lifting her dog repetitively. Developed soreness in the shoulder and wrist. In the wrist it's all on the radial side. Worse with use better with rest. No numbness or tingling. No discrete traumatic event. No previous history of wrist problems. Shoulder history is about the same. This is getting worse rather than better. Difficulty going overhead. It's starting to wake her at night. She's starting to lose motion. No prior evaluation or treatment of either of these areas. Remaining history general exam is outlined included in the chart. Her left knee which I replaced recently and also treated for a traumatic patella fracture after replacement is doing great. Good motion and function no issues.  EXAMINATION: Obvious de Quervain's tenosynovitis right wrist. A little sore over the radial side of her wrist joint but she has fairly good motion. Markedly positive de Quervain's testing with positive Finkelstein's. No evidence of carpal tunnel syndrome. No triggering of any digits. Right shoulder I can get through just about full motion although pain at the end points. Positive impingement positive palms down abduction. Give way weakness. Biceps sore but intact. Sore over the [facility] joint. No apprehension or  instability.  Left shoulder has full motion without rotator cuff irritation weakness or tenderness.  X-RAYS: 3 views of the wrist show moderate changes radial side not extreme. No fracture. No calcification. 3 views of her shoulder show type II acromion. Moderate changes AC joint with spurring. Reasonable subacromial space. Glenohumeral joint looks good.  IMPRESSION: 1. De Quervain's tenosynovitis right wrist. I went over options with her. We're going to try a diagnostic/therapeutic injection and then a home exercise program. Only further workup or treatment if things don't improve. I don't think we need to inject the wrist unless things fail with the other injection. 2. In regards to her shoulder she has overuse impingement. She is 63. I'm really hoping this is not an attritional rotator cuff tear but I'm a little worried about that. We're going to try a subacromial injection and JOBE exercise program. Follow-up as needed. If things don't dramatically improve she will call and I'll get a scan to look at her rotator cuff.  Continued  [name]/[facility] [address] [place], [place] [address] [phone] A Division of [facility]  [name], M.D.   Robert A. Noemi Chapel, M.D.   Faythe Casa, M.D.   Johnny Bridge, M.D.   Almedia Balls, M.D Ernesta Amble. Percell Miller, M.D.  Joseph Pierini, M.D.  Lanier Prude, M.D.    Verner Chol, M.D. Lovett Calender, PA- C  Mary L. Venida Jarvis, PA-C  Kirstin A. Shepperson, PA-C  Josh Maxwell, PA-C  [place], [place]  RE: Hayley, [name]   [phone]      DOB: March 23, 1951 PROGRESS NOTE: 08-02-14 PROCEDURE NOTE: The patient's clinical condition is marked by substantial pain and/or significant functional disability. Other conservative therapy has not provided relief, is contraindicated, or not appropriate. There is a reasonable likelihood that injection will significantly improve the  patient's pain and/or functional disability.   After appropriate consent and under sterile technique the first dorsal compartment de Quervain's tenosynovitis injected with Depo-Medrol/Marcaine on the right. We then did a subacromial injection on the right as well with 1:4 Depo-Medrol/Marcaine. Tolerated both these well. Good results with Marcaine in place.  [name], M.D.  Electronically verified by [name], M.D. DFM:kah D 08-03-14 T 08-04-14  [name] [address] [place], [place] [address] [phone] A Division of [facility]  [name], M.D.   Robert A. Noemi Chapel, M.D.   Faythe Casa, M.D.   Johnny Bridge, M.D.   Almedia Balls, M.D Ernesta Amble. Percell Miller, M.D.  Joseph Pierini, M.D.  Lanier Prude, M.D.    Verner Chol, M.D. Lovett Calender, PA- C  Mary L. Venida Jarvis, PA-C  Kirstin A. Shepperson, PA-C  Josh [place], PA-C  [place], [place]   RE: Hayley, [name]                                [phone]      DOB: [birth year] PROGRESS NOTE: 09-02-14 [name] comes in for follow up.  Previous treatment and injection for two issues.  Right shoulder impingement.  Improved with Marcaine and still is improved enough for now she can live with her symptoms there.  Still some degree of impingement, although not intolerable.  Based on response and how she is doing I am assuming there is not a significant structural tear of her cuff.  For now we are going to continue conservative treatment.   The other issue is her wrist.  De Quervain's tenosynovitis.  Much better with Marcaine, but the Cortisone really didn't help.  Marked functional impact.   We have had a thorough discussion of options.  More than 50 minutes face-to-face.  We are going to go ahead and proceed with definitive treatment of her wrist.  Release of the first dorsal compartment for de Quervain's tenosynovitis.  Procedure,  risks, benefits and complications reviewed.  Paperwork complete.  All questions answered.  Further addressing her shoulder just depending on symptoms in the future.  I went over all of this with her and she understands.  We will see her at the time of operative intervention.    [name], M.D.   Electronically verified by [name], M.D. DFM:jjh D 09-03-14 T 09-04-14

## 2014-09-15 ENCOUNTER — Encounter (HOSPITAL_BASED_OUTPATIENT_CLINIC_OR_DEPARTMENT_OTHER): Payer: Self-pay | Admitting: Certified Registered"

## 2014-09-15 ENCOUNTER — Ambulatory Visit (HOSPITAL_BASED_OUTPATIENT_CLINIC_OR_DEPARTMENT_OTHER)
Admission: RE | Admit: 2014-09-15 | Discharge: 2014-09-15 | Disposition: A | Discharge status: Home / Self Care | Payer: BC Managed Care – PPO | Source: Ambulatory Visit | Attending: Orthopedic Surgery | Admitting: Orthopedic Surgery

## 2014-09-15 ENCOUNTER — Encounter (HOSPITAL_BASED_OUTPATIENT_CLINIC_OR_DEPARTMENT_OTHER)
Admission: RE | Disposition: A | Discharge status: Home / Self Care | Payer: Self-pay | Source: Ambulatory Visit | Attending: Orthopedic Surgery

## 2014-09-15 ENCOUNTER — Ambulatory Visit (HOSPITAL_BASED_OUTPATIENT_CLINIC_OR_DEPARTMENT_OTHER): Payer: BC Managed Care – PPO | Admitting: Certified Registered"

## 2014-09-15 DIAGNOSIS — E119 Type 2 diabetes mellitus without complications: Secondary | ICD-10-CM | POA: Diagnosis not present

## 2014-09-15 DIAGNOSIS — M7541 Impingement syndrome of right shoulder: Secondary | ICD-10-CM | POA: Diagnosis not present

## 2014-09-15 DIAGNOSIS — M654 Radial styloid tenosynovitis [de Quervain]: Secondary | ICD-10-CM | POA: Diagnosis not present

## 2014-09-15 DIAGNOSIS — Z6836 Body mass index (BMI) 36.0-36.9, adult: Secondary | ICD-10-CM | POA: Diagnosis not present

## 2014-09-15 DIAGNOSIS — I350 Nonrheumatic aortic (valve) stenosis: Secondary | ICD-10-CM | POA: Diagnosis not present

## 2014-09-15 HISTORY — PX: DORSAL COMPARTMENT RELEASE: SHX5039

## 2014-09-15 LAB — POCT HEMOGLOBIN-HEMACUE: Hemoglobin: 14.5 g/dL (ref 12.0–15.0)

## 2014-09-15 SURGERY — RELEASE, FIRST DORSAL COMPARTMENT, HAND
Anesthesia: General | Site: Wrist | Laterality: Right

## 2014-09-15 MED ORDER — OXYCODONE HCL 5 MG/5ML PO SOLN: 5.0000 mg | Freq: Once | ORAL | Status: DC | PRN

## 2014-09-15 MED ORDER — CHLORHEXIDINE GLUCONATE 4 % EX LIQD: 60.0000 mL | Freq: Once | CUTANEOUS | Status: DC

## 2014-09-15 MED ORDER — OXYCODONE HCL 5 MG PO TABS: 5.0000 mg | ORAL_TABLET | Freq: Once | ORAL | Status: DC | PRN

## 2014-09-15 MED ORDER — OXYCODONE-ACETAMINOPHEN 5-325 MG PO TABS
1.0000 | ORAL_TABLET | ORAL | Status: DC | PRN
Start: 2014-09-15 — End: 2014-09-15

## 2014-09-15 MED ORDER — LIDOCAINE HCL (CARDIAC) 20 MG/ML IV SOLN: INTRAVENOUS | Status: DC | PRN

## 2014-09-15 MED ORDER — FENTANYL CITRATE 0.05 MG/ML IJ SOLN
INTRAMUSCULAR | Status: AC
  Filled 2014-09-15: qty 4

## 2014-09-15 MED ORDER — LIDOCAINE HCL (CARDIAC) 20 MG/ML IV SOLN
INTRAVENOUS | Status: DC | PRN
  Administered 2014-09-15: 100 mg via INTRAVENOUS

## 2014-09-15 MED ORDER — LACTATED RINGERS IV SOLN
INTRAVENOUS | Status: DC
  Administered 2014-09-15: 08:00:00 via INTRAVENOUS

## 2014-09-15 MED ORDER — BUPIVACAINE HCL (PF) 0.5 % IJ SOLN
INTRAMUSCULAR | Status: DC | PRN
  Administered 2014-09-15: 5 mL

## 2014-09-15 MED ORDER — MIDAZOLAM HCL 2 MG/2ML IJ SOLN: 1.0000 mg | INTRAMUSCULAR | Status: DC | PRN

## 2014-09-15 MED ORDER — MIDAZOLAM HCL 5 MG/5ML IJ SOLN
INTRAMUSCULAR | Status: DC | PRN
  Administered 2014-09-15: 2 mg via INTRAVENOUS

## 2014-09-15 MED ORDER — ONDANSETRON HCL 4 MG/2ML IJ SOLN: 4.0000 mg | Freq: Four times a day (QID) | INTRAMUSCULAR | Status: DC | PRN

## 2014-09-15 MED ORDER — HYDROMORPHONE HCL 1 MG/ML IJ SOLN: 0.5000 mg | INTRAMUSCULAR | Status: DC | PRN

## 2014-09-15 MED ORDER — ONDANSETRON HCL 4 MG PO TABS: 4.0000 mg | ORAL_TABLET | Freq: Four times a day (QID) | ORAL | Status: DC | PRN

## 2014-09-15 MED ORDER — CLINDAMYCIN PHOSPHATE 900 MG/50ML IV SOLN
INTRAVENOUS | Status: AC
  Filled 2014-09-15: qty 50

## 2014-09-15 MED ORDER — ONDANSETRON HCL 4 MG/2ML IJ SOLN: 4.0000 mg | Freq: Once | INTRAMUSCULAR | Status: DC | PRN

## 2014-09-15 MED ORDER — FENTANYL CITRATE 0.05 MG/ML IJ SOLN
INTRAMUSCULAR | Status: DC | PRN
  Administered 2014-09-15: 100 ug via INTRAVENOUS

## 2014-09-15 MED ORDER — DEXAMETHASONE SODIUM PHOSPHATE 10 MG/ML IJ SOLN
INTRAMUSCULAR | Status: DC | PRN
  Administered 2014-09-15: 4 mg via INTRAVENOUS

## 2014-09-15 MED ORDER — OXYCODONE-ACETAMINOPHEN 5-325 MG PO TABS: 1.0000 | ORAL_TABLET | ORAL | Status: DC | PRN

## 2014-09-15 MED ORDER — METOCLOPRAMIDE HCL 5 MG/ML IJ SOLN: 5.0000 mg | Freq: Three times a day (TID) | INTRAMUSCULAR | Status: DC | PRN

## 2014-09-15 MED ORDER — HYDROMORPHONE HCL 1 MG/ML IJ SOLN
0.2500 mg | INTRAMUSCULAR | Status: DC | PRN
  Administered 2014-09-15 (×2): 0.25 mg via INTRAVENOUS
  Administered 2014-09-15: 0.5 mg via INTRAVENOUS

## 2014-09-15 MED ORDER — ONDANSETRON HCL 4 MG/2ML IJ SOLN
INTRAMUSCULAR | Status: DC | PRN
  Administered 2014-09-15: 4 mg via INTRAVENOUS

## 2014-09-15 MED ORDER — ONDANSETRON HCL 4 MG PO TABS: 4.0000 mg | ORAL_TABLET | Freq: Three times a day (TID) | ORAL | Status: DC | PRN

## 2014-09-15 MED ORDER — FENTANYL CITRATE 0.05 MG/ML IJ SOLN: 50.0000 ug | INTRAMUSCULAR | Status: DC | PRN

## 2014-09-15 MED ORDER — CLINDAMYCIN PHOSPHATE 900 MG/50ML IV SOLN
900.0000 mg | INTRAVENOUS | Status: AC
  Administered 2014-09-15: 900 mg via INTRAVENOUS

## 2014-09-15 MED ORDER — METOCLOPRAMIDE HCL 5 MG PO TABS: 5.0000 mg | ORAL_TABLET | Freq: Three times a day (TID) | ORAL | Status: DC | PRN

## 2014-09-15 MED ORDER — MIDAZOLAM HCL 2 MG/2ML IJ SOLN
INTRAMUSCULAR | Status: AC
  Filled 2014-09-15: qty 2

## 2014-09-15 MED ORDER — HYDROMORPHONE HCL 1 MG/ML IJ SOLN
INTRAMUSCULAR | Status: AC
  Filled 2014-09-15: qty 1

## 2014-09-15 MED ORDER — PROPOFOL 10 MG/ML IV BOLUS
INTRAVENOUS | Status: DC | PRN
  Administered 2014-09-15: 110 mg via INTRAVENOUS

## 2014-09-15 SURGICAL SUPPLY — 45 items
BANDAGE ELASTIC 3 VELCRO ST LF (GAUZE/BANDAGES/DRESSINGS) ×2 IMPLANT
BLADE SURG 15 STRL LF DISP TIS (BLADE) ×1 IMPLANT
BLADE SURG 15 STRL SS (BLADE) ×2
BNDG CMPR 9X4 STRL LF SNTH (GAUZE/BANDAGES/DRESSINGS) ×1
BNDG COHESIVE 3X5 TAN STRL LF (GAUZE/BANDAGES/DRESSINGS) ×2 IMPLANT
BNDG ESMARK 4X9 LF (GAUZE/BANDAGES/DRESSINGS) ×1 IMPLANT
CORDS BIPOLAR (ELECTRODE) IMPLANT
COVER BACK TABLE 60X90IN (DRAPES) ×2 IMPLANT
COVER MAYO STAND STRL (DRAPES) ×2 IMPLANT
CUFF TOURNIQUET SINGLE 18IN (TOURNIQUET CUFF) IMPLANT
CUFF TOURNIQUET SINGLE 24IN (TOURNIQUET CUFF) ×1 IMPLANT
DRAPE EXTREMITY T 121X128X90 (DRAPE) ×2 IMPLANT
DRAPE SURG 17X23 STRL (DRAPES) ×2 IMPLANT
DURAPREP 26ML APPLICATOR (WOUND CARE) ×2 IMPLANT
GAUZE SPONGE 4X4 12PLY STRL (GAUZE/BANDAGES/DRESSINGS) ×2 IMPLANT
GAUZE XEROFORM 1X8 LF (GAUZE/BANDAGES/DRESSINGS) ×2 IMPLANT
GLOVE BIOGEL PI IND STRL 7.0 (GLOVE) ×1 IMPLANT
GLOVE BIOGEL PI IND STRL 8 (GLOVE) IMPLANT
GLOVE BIOGEL PI INDICATOR 7.0 (GLOVE) ×2
GLOVE BIOGEL PI INDICATOR 8 (GLOVE) ×2
GLOVE ECLIPSE 6.5 STRL STRAW (GLOVE) ×1 IMPLANT
GLOVE ECLIPSE 7.0 STRL STRAW (GLOVE) ×2 IMPLANT
GLOVE ORTHO TXT STRL SZ7.5 (GLOVE) ×2 IMPLANT
GLOVE SURG ORTHO 8.0 STRL STRW (GLOVE) ×2 IMPLANT
GLOVE SURG SS PI 8.0 STRL IVOR (GLOVE) ×1 IMPLANT
GOWN STRL REUS W/ TWL LRG LVL3 (GOWN DISPOSABLE) ×2 IMPLANT
GOWN STRL REUS W/ TWL XL LVL3 (GOWN DISPOSABLE) ×1 IMPLANT
GOWN STRL REUS W/TWL LRG LVL3 (GOWN DISPOSABLE) ×4
GOWN STRL REUS W/TWL XL LVL3 (GOWN DISPOSABLE) ×4
NDL HYPO 25X1 1.5 SAFETY (NEEDLE) ×1 IMPLANT
NEEDLE HYPO 25X1 1.5 SAFETY (NEEDLE) ×2 IMPLANT
NS IRRIG 1000ML POUR BTL (IV SOLUTION) ×2 IMPLANT
PACK BASIN DAY SURGERY FS (CUSTOM PROCEDURE TRAY) ×2 IMPLANT
PAD CAST 3X4 CTTN HI CHSV (CAST SUPPLIES) ×2 IMPLANT
PADDING CAST ABS 3INX4YD NS (CAST SUPPLIES) ×1
PADDING CAST ABS COTTON 3X4 (CAST SUPPLIES) ×2 IMPLANT
PADDING CAST COTTON 3X4 STRL (CAST SUPPLIES) ×2
SPLINT PLASTER CAST XFAST 3X15 (CAST SUPPLIES) IMPLANT
SPLINT PLASTER XTRA FASTSET 3X (CAST SUPPLIES) ×10
STOCKINETTE 4X48 STRL (DRAPES) ×2 IMPLANT
SUT ETHILON 3 0 PS 1 (SUTURE) ×2 IMPLANT
SYR BULB 3OZ (MISCELLANEOUS) ×2 IMPLANT
SYR CONTROL 10ML LL (SYRINGE) ×2 IMPLANT
TOWEL OR 17X24 6PK STRL BLUE (TOWEL DISPOSABLE) ×2 IMPLANT
UNDERPAD 30X30 INCONTINENT (UNDERPADS AND DIAPERS) ×1 IMPLANT

## 2014-09-15 NOTE — Transfer of Care (Signed)
Immediate Anesthesia Transfer of Care Note  Patient: [name]  Procedure(s) Performed: Procedure(s): RIGHT WRIST DEQUERVAINS RELEASE  (Right)  Patient Location: PACU  Anesthesia Type:General  Level of Consciousness: awake, alert  and patient cooperative  Airway & Oxygen Therapy: Patient Spontanous Breathing and Patient connected to face mask oxygen  Post-op Assessment: Report given to RN, Post -op Vital signs reviewed and stable and Patient moving all extremities  Post vital signs: Reviewed and stable  Last Vitals:  Filed Vitals:   09/15/14 0742  BP: 133/83  Pulse: 89  Temp: 37.1 C  Resp: 22    Complications: No apparent anesthesia complications

## 2014-09-15 NOTE — Anesthesia Procedure Notes (Signed)
Procedure Name: LMA Insertion Date/Time: 09/15/2014 8:40 AM Performed by: Baxter Flattery Pre-anesthesia Checklist: Patient identified, Emergency Drugs available, Suction available and Patient being monitored Patient Re-evaluated:Patient Re-evaluated prior to inductionOxygen Delivery Method: Circle System Utilized Preoxygenation: Pre-oxygenation with 100% oxygen Intubation Type: IV induction Ventilation: Mask ventilation without difficulty LMA: LMA inserted LMA Size: 4.0 Number of attempts: 1 Airway Equipment and Method: Bite block Placement Confirmation: positive ETCO2 and breath sounds checked- equal and bilateral Tube secured with: Tape Dental Injury: Teeth and Oropharynx as per pre-operative assessment

## 2014-09-15 NOTE — Anesthesia Postprocedure Evaluation (Signed)
  Anesthesia Post-op Note  Patient: [name]  Procedure(s) Performed: Procedure(s): RIGHT WRIST DEQUERVAINS RELEASE  (Right)  Patient Location: PACU  Anesthesia Type: General   Level of Consciousness: awake, alert  and oriented  Airway and Oxygen Therapy: Patient Spontanous Breathing  Post-op Pain: mild  Post-op Assessment: Post-op Vital signs reviewed  Post-op Vital Signs: Reviewed  Last Vitals:  Filed Vitals:   09/15/14 1120  BP: 145/87  Pulse: 85  Temp: 37.1 C  Resp: 14    Complications: No apparent anesthesia complications

## 2014-09-15 NOTE — Discharge Instructions (Signed)
Do not remove splint until follow up appointment in one week.  May shower in 3 days, but do not let splint get wet.  Follow up appointment in one week.    If you have a plaster or fiberglass cast:  Do not try to scratch the skin under the cast using sharp or pointed objects.  Check the skin around the cast every day. You may put lotion on any red or sore areas.  Keep your cast dry and clean.       Your cast or splint can be protected during bathing with a plastic bag. Do not lower the cast or splint into water.  Only take over-the-counter or prescription medicines for pain, discomfort, or fever as directed by your caregiver.   Post Anesthesia Home Care Instructions  Activity: Get plenty of rest for the remainder of the day. A responsible adult should stay with you for 24 hours following the procedure.  For the next 24 hours, DO NOT: -Drive a car -Paediatric nurse -Drink alcoholic beverages -Take any medication unless instructed by your physician -Make any legal decisions or sign important papers.  Meals: Start with liquid foods such as gelatin or soup. Progress to regular foods as tolerated. Avoid greasy, spicy, heavy foods. If nausea and/or vomiting occur, drink only clear liquids until the nausea and/or vomiting subsides. Call your physician if vomiting continues.  Special Instructions/Symptoms: Your throat may feel dry or sore from the anesthesia or the breathing tube placed in your throat during surgery. If this causes discomfort, gargle with warm salt water. The discomfort should disappear within 24 hours.

## 2014-09-15 NOTE — Anesthesia Preprocedure Evaluation (Signed)
Anesthesia Evaluation  Patient identified by MRN, date of birth, ID band Patient awake    Reviewed: Allergy & Precautions, NPO status , Patient's Chart, lab work & pertinent test results  Airway Mallampati: I  TM Distance: >3 FB Neck ROM: Full    Dental  (+) Teeth Intact, Dental Advisory Given   Pulmonary  breath sounds clear to auscultation        Cardiovascular + Valvular Problems/Murmurs (Moderate AS) AS Rhythm:Regular Rate:Normal     Neuro/Psych    GI/Hepatic   Endo/Other  diabetes, Well ControlledMorbid obesity  Renal/GU      Musculoskeletal   Abdominal   Peds  Hematology   Anesthesia Other Findings   Reproductive/Obstetrics                             Anesthesia Physical Anesthesia Plan  ASA: III  Anesthesia Plan: General   Post-op Pain Management:    Induction: Intravenous  Airway Management Planned: LMA  Additional Equipment:   Intra-op Plan:   Post-operative Plan: Extubation in OR  Informed Consent: I have reviewed the patients History and Physical, chart, labs and discussed the procedure including the risks, benefits and alternatives for the proposed anesthesia with the patient or authorized representative who has indicated his/her understanding and acceptance.   Dental advisory given  Plan Discussed with: CRNA, Anesthesiologist and Surgeon  Anesthesia Plan Comments:         Anesthesia Quick Evaluation

## 2014-09-15 NOTE — Interval H&P Note (Signed)
History and Physical Interval Note:  09/15/2014 7:33 AM  [name]  has presented today for surgery, with the diagnosis of RADIAL STYLOID TENOSYNOVITIS (DE QUERVAIN)  The various methods of treatment have been discussed with the patient and family. After consideration of risks, benefits and other options for treatment, the patient has consented to  Procedure(s): RIGHT WRIST DEQUERVAINS RELEASE  (Right) as a surgical intervention .  The patient's history has been reviewed, patient examined, no change in status, stable for surgery.  I have reviewed the patient's chart and labs.  Questions were answered to the patient's satisfaction.     Jovi Zavadil F

## 2014-09-16 NOTE — Op Note (Signed)
NAMEMIRACLE, MONGILLO NO.:  [id number]  MEDICAL RECORD NO.:  [id number]  LOCATION:                               FACILITY:  [facility]  PHYSICIAN:  [name], M.D. DATE OF BIRTH:  1951-01-27  DATE OF PROCEDURE:  09/15/2014 DATE OF DISCHARGE:  09/15/2014                              OPERATIVE REPORT   PREOPERATIVE DIAGNOSIS:  Recalcitrant de Quervain tenosynovitis, right wrist.  POSTOPERATIVE DIAGNOSIS:  Recalcitrant de Quervain tenosynovitis, right wrist.  PROCEDURE:  Release of first dorsal compartment, right wrist.  SURGEON:  [name], M.D.  ASSISTANT:  Elmyra Ricks, P.A.  ANESTHESIA:  General.  BLOOD LOSS:  Minimal.  SPECIMENS:  None.  CULTURES:  None.  COMPLICATIONS:  None.  DRESSINGS:  Soft compressive thumb spica splint.  TOURNIQUET TIME:  30 minutes.  PROCEDURE:  The patient was brought to the operating room, placed on the operating table in a supine position.  After adequate anesthesia had been obtained, tourniquet applied, prepped and draped in usual sterile fashion.  Exsanguinated with elevation of Esmarch.  Tourniquet inflated to 250 mmHg.  A small transverse incision over the first dorsal component.  Superficial branch radial nerve identified and protected. Retinaculum incised from well proximal to distal over the first dorsal compartment.  Two separate discrete compartments.  A lot of tenosynovial fluid present.  Tenosynovectomy.  Nice complete decompression.  Once I was sure we had released all compartments, wounds were irrigated, closed with nylon.  Margins were injected with Marcaine.  Sterile compressive dressing applied.  Thumb spica splint applied.  Tourniquet deflated and removed.  Anesthesia reversed.  Brought to the recovery room.  Tolerated the surgery well. No complications.     [name], M.D.     DFM/MEDQ  D:  09/15/2014  T:  09/15/2014  Job:  [id number]

## 2014-09-20 ENCOUNTER — Encounter (HOSPITAL_BASED_OUTPATIENT_CLINIC_OR_DEPARTMENT_OTHER): Payer: Self-pay | Admitting: Orthopedic Surgery

## 2014-09-26 ENCOUNTER — Encounter: Payer: BC Managed Care – PPO | Attending: Family Medicine

## 2014-09-26 DIAGNOSIS — Z713 Dietary counseling and surveillance: Secondary | ICD-10-CM | POA: Diagnosis not present

## 2014-09-26 DIAGNOSIS — E119 Type 2 diabetes mellitus without complications: Secondary | ICD-10-CM

## 2014-09-26 NOTE — Progress Notes (Signed)
Appt start time: 0900 end time:  1000.  Patient was seen on 09/26/2014 for a review of the series of three diabetes self-management courses at the [facility]. The following learning objectives were met by the patient during this class:  . Reviewed blood glucose monitoring and interpretation including the recommended target ranges and Hgb A1c.  . Reviewed on carb counting, importance of regularly scheduled meals/snacks, and meal planning.  . Reviewed the effects of physical activity on glucose levels and long-term glucose control.  Recommended goal of 150 minutes of physical activity/week. . Reviewed patient medications and discussed role of medication on blood glucose and possible side effects. . Discussed strategies to manage stress, psychosocial issues, and other obstacles to diabetes management. . Encouraged moderate weight reduction to improve glucose levels.   . Reviewed short-term complications: hyper- and hypo-glycemia.  Discussed causes, symptoms, and treatment options. . Reviewed prevention, detection, and treatment of long-term complications.  Discussed the role of prolonged elevated glucose levels on body systems.  Goals:  Follow Diabetes Meal Plan as instructed  Eat 3 meals and 2 snacks, every 3-5 hrs  Limit carbohydrate intake to 45 grams carbohydrate/meal Limit carbohydrate intake to 15 grams carbohydrate/snack Add lean protein foods to meals/snacks  Monitor glucose levels as instructed by your doctor  Aim for goal of 15-30 mins of physical activity daily as tolerated  Bring food record and glucose log to your next nutrition visit

## 2014-11-08 ENCOUNTER — Encounter: Payer: Self-pay | Admitting: Family Medicine

## 2014-11-09 ENCOUNTER — Ambulatory Visit (INDEPENDENT_AMBULATORY_CARE_PROVIDER_SITE_OTHER)
Admission: RE | Admit: 2014-11-09 | Discharge: 2014-11-09 | Disposition: A | Discharge status: Home / Self Care | Payer: BC Managed Care – PPO | Source: Ambulatory Visit | Attending: Family Medicine | Admitting: Family Medicine

## 2014-11-09 ENCOUNTER — Encounter: Payer: Self-pay | Admitting: Family Medicine

## 2014-11-09 ENCOUNTER — Ambulatory Visit (INDEPENDENT_AMBULATORY_CARE_PROVIDER_SITE_OTHER): Payer: BC Managed Care – PPO | Admitting: Family Medicine

## 2014-11-09 VITALS — BP 112/70 | HR 88 | Temp 99.1°F | Wt 231.0 lb

## 2014-11-09 DIAGNOSIS — E119 Type 2 diabetes mellitus without complications: Secondary | ICD-10-CM | POA: Diagnosis not present

## 2014-11-09 DIAGNOSIS — R0789 Other chest pain: Secondary | ICD-10-CM

## 2014-11-09 DIAGNOSIS — R079 Chest pain, unspecified: Secondary | ICD-10-CM

## 2014-11-09 DIAGNOSIS — I350 Nonrheumatic aortic (valve) stenosis: Secondary | ICD-10-CM

## 2014-11-09 DIAGNOSIS — E785 Hyperlipidemia, unspecified: Secondary | ICD-10-CM | POA: Diagnosis not present

## 2014-11-09 LAB — COMPREHENSIVE METABOLIC PANEL
ALT: 14 U/L (ref 0–35)
AST: 18 U/L (ref 0–37)
Albumin: 4.6 g/dL (ref 3.5–5.2)
Alkaline Phosphatase: 200 U/L — ABNORMAL HIGH (ref 39–117)
BUN: 13 mg/dL (ref 6–23)
CO2: 28 mEq/L (ref 19–32)
Calcium: 9.9 mg/dL (ref 8.4–10.5)
Chloride: 98 mEq/L (ref 96–112)
Creatinine, Ser: 0.64 mg/dL (ref 0.40–1.20)
GFR: 99.31 mL/min (ref >60.00)
Glucose, Bld: 106 mg/dL — ABNORMAL HIGH (ref 70–99)
Potassium: 4.4 mEq/L (ref 3.5–5.1)
Sodium: 136 mEq/L (ref 135–145)
Total Bilirubin: 0.4 mg/dL (ref 0.2–1.2)
Total Protein: 7.5 g/dL (ref 6.0–8.3)

## 2014-11-09 LAB — CBC
HCT: 42.8 % (ref 36.0–46.0)
Hemoglobin: 14.5 g/dL (ref 12.0–15.0)
MCHC: 33.8 g/dL (ref 30.0–36.0)
MCV: 87.1 fl (ref 78.0–100.0)
Platelets: 343 10*3/uL (ref 150.0–400.0)
RBC: 4.92 Mil/uL (ref 3.87–5.11)
RDW: 12.8 % (ref 11.5–15.5)
WBC: 9.3 10*3/uL (ref 4.0–10.5)

## 2014-11-09 LAB — TSH: TSH: 2.21 u[IU]/mL (ref 0.35–4.50)

## 2014-11-09 LAB — LDL CHOLESTEROL, DIRECT: Direct LDL: 84 mg/dL

## 2014-11-09 NOTE — Progress Notes (Signed)
Garret Reddish, MD  Subjective:  [name] is a 64 y.o. year old very pleasant female patient who presents with:   Atypical Chest Pain -years of intermittent chest pain (told previously maybe esophageal spasm by prior PCP) that seems to be increasing in frequency now to once a week. Used to just occur quickly and would take breath away and would feel some radiation to back or left shoulder. Most recent one, woke her up from sleep. Very scary when it happens. States would call 911 if didn't stop so fast. Chest pain lasts 1-2 minutes located just left of sternum in med chest- staying localized to chest most recently. Sharp pain. 2 weeks ago, was sitting in booth at [employer] about to order food. Has never occurred with exercise or exertion. Stays still and goes away. Sees Dr. [name] of GI and prilosec was increased to 40mg  this year. Stricture was dilated in esophagus at that time.   ROS- no shortness of breath, diaphoresis, 1x had radiation to shoulder but not at any other time  Past Medical History-  Risk factors: HLD controlled on atorvastatin. DM with a1c 6.9 controlld. No hypertension. Known aortic stenosis. Fmaily history: ? Heart disease in alcoholic father who died at 10 of unclear cause  Medications- reviewed and updated Current Outpatient Prescriptions  Medication Sig Dispense Refill  . atorvastatin (LIPITOR) 40 MG tablet Take on Tuesday and Friday 1 pill at night time. 30 tablet 11  . calcium-vitamin D (OSCAL WITH D) 500-200 MG-UNIT per tablet Take 1 tablet by mouth daily.    . DULoxetine (CYMBALTA) 30 MG capsule Take 1 capsule (30 mg total) by mouth 3 (three) times daily. 90 capsule 6  . KLS ALLERCLEAR D-24HR 10-240 MG per 24 hr tablet TAKE 1 TABLET BY MOUTH ONCE DAILY 30 tablet 11  . metoCLOPramide (REGLAN) 10 MG tablet Take 1/2 tablet 4 times daily. 60 tablet 5  . nabumetone (RELAFEN) 750 MG tablet TAKE 2 TABLETS BY MOUTH EVERY DAY 60 tablet 2  . omeprazole (PRILOSEC)  40 MG capsule Take 1 capsule (40 mg total) by mouth daily. 30 capsule 11  . Probiotic Product (ALIGN) 4 MG CAPS Take by mouth.    . psyllium (METAMUCIL SMOOTH TEXTURE) 28 % packet Take 1 packet by mouth 2 (two) times daily.     [name] azelastine (ASTELIN) 137 MCG/SPRAY nasal spray Place 1 spray into the nose 2 (two) times daily.     [name] doxycycline (VIBRAMYCIN) 100 MG capsule Take 100 mg by mouth 2 (two) times daily.     . fluticasone (FLONASE) 50 MCG/ACT nasal spray     . ondansetron (ZOFRAN) 4 MG tablet Take 1 tablet (4 mg total) by mouth every 8 (eight) hours as needed for nausea or vomiting. (Patient not taking: Reported on 11/09/2014) 40 tablet 0  . SUMAtriptan (IMITREX) 100 MG tablet TAKE 1 TABLET BY MOUTH EVERY 2 HOURS AS NEEDED FOR MIGRAINE (Patient not taking: Reported on 11/09/2014) 9 tablet 3   No current facility-administered medications for this visit.    Objective: BP 112/70 mmHg  Pulse 88  Temp(Src) 99.1 F (37.3 C)  Wt 231 lb (104.781 kg) Gen: NAD, resting comfortably No thyromegaly CV: RRR no murmurs rubs or gallops No chest wall tenderness Lungs: CTAB no crackles, wheeze, rhonchi Abdomen: soft/nontender/nondistended/normal bowel sounds. No rebound or guarding.  Ext: no edema Skin: warm, dry, no rash over chest or back   EKG: Sinus rhythm rate 85, normal axis, normal intervals, hypertrophy noted  of LV, inverted t wave in v1, v2 compared to qrs complex without other st t wave changes. Unchanged from prior    Assessment/Plan:  Atypical chest pain Doubt GERD on prilosec 40mg  already. History esophageal stricture but just dilated earlier this year. Could be esophageal spasm. Could be anxiety. No obvious costochondritis on exam. CXR without PNA or obvious cause of pain. Labwork largely unremarkable. EKG unchanged from prior. Given cause unclear and risk factors including DM and HLD (though both controlled) would like to get cardiology's opinion if she warrants further testing. I  know her symptoms are atypical for cardiac pain but I still want their opinion given some concerning features such as waking at night and increasing frequency as well as risk factors. Has known moderate aortic stenosis but I have low suspicion this is causing this pain.    Moderate aortic stenosis Patient also has what was presumed previously to be asymptomatic aortic stenosis. There was a question of bicuspid valve. We completed echo in 04/2014 and were planning on 2 year repeat-but would appreciate cards opinion on if this could be contributing to pain (I doubt) or if she needs more frequent follow up or an earlier repeat echo.     Emergent return precautions advised.   Dg Chest 2 View  11/09/2014   CLINICAL DATA:  Substernal chest pain  EXAM: CHEST  2 VIEW  COMPARISON:  04/09/2012  FINDINGS: The heart size and mediastinal contours are within normal limits. Both lungs are clear. The visualized skeletal structures are unremarkable.  IMPRESSION: No active cardiopulmonary disease.   Electronically Signed   By: Inez Catalina M.D.   On: 11/09/2014 14:27    Results for orders placed or performed in visit on 11/09/14 (from the past 24 hour(s))  CBC     Status: None   Collection Time: 11/09/14 11:17 AM  Result Value Ref Range   WBC 9.3 4.0 - 10.5 K/uL   RBC 4.92 3.87 - 5.11 Mil/uL   Platelets 343.0 150.0 - 400.0 K/uL   Hemoglobin 14.5 12.0 - 15.0 g/dL   HCT 42.8 36.0 - 46.0 %   MCV 87.1 78.0 - 100.0 fl   MCHC 33.8 30.0 - 36.0 g/dL   RDW 12.8 11.5 - 15.5 %  Comprehensive metabolic panel     Status: Abnormal   Collection Time: 11/09/14 11:17 AM  Result Value Ref Range   Sodium 136 135 - 145 mEq/L   Potassium 4.4 3.5 - 5.1 mEq/L   Chloride 98 96 - 112 mEq/L   CO2 28 19 - 32 mEq/L   Glucose, Bld 106 (H) 70 - 99 mg/dL   BUN 13 6 - 23 mg/dL   Creatinine, Ser 0.64 0.40 - 1.20 mg/dL   Total Bilirubin 0.4 0.2 - 1.2 mg/dL   Alkaline Phosphatase 200 (H) 39 - 117 U/L   AST 18 0 - 37 U/L   ALT 14  0 - 35 U/L   Total Protein 7.5 6.0 - 8.3 g/dL   Albumin 4.6 3.5 - 5.2 g/dL   Calcium 9.9 8.4 - 10.5 mg/dL   GFR 99.31 >60.00 mL/min  LDL cholesterol, direct     Status: None   Collection Time: 11/09/14 11:17 AM  Result Value Ref Range   Direct LDL 84.0 mg/dL  TSH     Status: None   Collection Time: 11/09/14 11:17 AM  Result Value Ref Range   TSH 2.21 0.35 - 4.50 uIU/mL    Orders Placed This Encounter  Procedures  .  DG Chest 2 View  . Ambulatory referral to Cardiology  . EKG 12-Lead

## 2014-11-09 NOTE — Patient Instructions (Signed)
Atypical chest pain  Reflux is already treated  Could be esophageal spasm  Could be anxiety but this has been controlled recently  I think the likelihood of this being your heart is not high but you do have risk factors. Before ordering a stress test, want to get cardiology's opinion if this is needed so have referred you.   We will call you within a week about your referral. If you do not hear within 2 weeks, give [name] a call.   Update labs-unfortunately too early for a1c  Let's follow up a few weeks after cardiology visit. Seek care if you have new or worsening symptoms or more frequent symptoms

## 2014-11-10 ENCOUNTER — Telehealth: Payer: Self-pay | Admitting: Internal Medicine

## 2014-11-10 ENCOUNTER — Encounter: Payer: Self-pay | Admitting: *Deleted

## 2014-11-10 DIAGNOSIS — R0789 Other chest pain: Secondary | ICD-10-CM | POA: Insufficient documentation

## 2014-11-10 NOTE — Assessment & Plan Note (Addendum)
Doubt GERD on prilosec 40mg  already. History esophageal stricture but just dilated earlier this year. Could be esophageal spasm. Could be anxiety. No obvious costochondritis on exam. CXR without PNA or obvious cause of pain. Labwork largely unremarkable. EKG unchanged from prior. Given cause unclear and risk factors including DM and HLD (though both controlled) would like to get cardiology's opinion if she warrants further testing. I know her symptoms are atypical for cardiac pain but I still want their opinion given some concerning features such as waking at night and increasing frequency as well as risk factors. Has known moderate aortic stenosis but I have low suspicion this is causing this pain.

## 2014-11-10 NOTE — Assessment & Plan Note (Signed)
Patient also has what was presumed previously to be asymptomatic aortic stenosis. There was a question of bicuspid valve. We completed echo in 04/2014 and were planning on 2 year repeat-but would appreciate cards opinion on if this could be contributing to pain (I doubt) or if she needs more frequent follow up or an earlier repeat echo.

## 2014-11-11 NOTE — Telephone Encounter (Signed)
Prior auth request sent through [email or web address]

## 2014-11-14 NOTE — Telephone Encounter (Signed)
Patient's omeprazole has been approved from 10/12/14-11/11/15 per [employer]. Case ID# [id number].

## 2014-11-15 ENCOUNTER — Encounter: Payer: Self-pay | Admitting: Family Medicine

## 2014-11-28 ENCOUNTER — Other Ambulatory Visit: Payer: Self-pay | Admitting: *Deleted

## 2014-11-28 MED ORDER — LORATADINE-PSEUDOEPHEDRINE ER 10-240 MG PO TB24: 1.0000 | ORAL_TABLET | Freq: Every day | ORAL | Status: DC

## 2014-12-19 ENCOUNTER — Other Ambulatory Visit: Payer: Self-pay

## 2014-12-22 ENCOUNTER — Ambulatory Visit (INDEPENDENT_AMBULATORY_CARE_PROVIDER_SITE_OTHER): Payer: BC Managed Care – PPO | Admitting: Cardiology

## 2014-12-22 VITALS — BP 124/78 | HR 94 | Ht 66.0 in | Wt 233.0 lb

## 2014-12-22 DIAGNOSIS — R0789 Other chest pain: Secondary | ICD-10-CM | POA: Diagnosis not present

## 2014-12-22 DIAGNOSIS — E785 Hyperlipidemia, unspecified: Secondary | ICD-10-CM | POA: Diagnosis not present

## 2014-12-22 DIAGNOSIS — E119 Type 2 diabetes mellitus without complications: Secondary | ICD-10-CM | POA: Diagnosis not present

## 2014-12-22 MED ORDER — ASPIRIN 81 MG PO TABS: 81.0000 mg | ORAL_TABLET | Freq: Every day | ORAL | Status: DC

## 2014-12-22 NOTE — Patient Instructions (Signed)
Medication Instructions:   START TAKING ASPIRIN 81 MG ONCE DAILY      Testing/Procedures:  Your physician has requested that you have a lexiscan myoview. For further information please visit [email or web address]. Please follow instruction sheet, as given.     Follow-Up:  3 MONTHS WITH DR Meda Coffee

## 2014-12-22 NOTE — Progress Notes (Signed)
Patient ID: [name], female   DOB: Oct 04, 1950, 64 y.o.   MRN: [id number]      Cardiology Office Note  Date:  12/22/2014   ID:  [name], DOB 1950-10-17, MRN [id number]  PCP:  Garret Reddish, MD  Cardiologist:  Dorothy Spark, MD   Chief complain: chest pain  History of Present Illness: [name] is a 64 y.o. female who presents for evaluation of chest pain.The patient has h/o DM, HLP that has developed exertional chest pain intermittent chest pain, treated for GERD, but this pain feels differently. The pain is increasing in frequency now to once a week. Used to just occur quickly and would take breath away and would feel some radiation to back or left shoulder. Most recent one, woke her up from sleep. The last episode happen before eating at a restaurant, lasted for about 30 minutes and resolved, no obvious alleviating or provoking factors. No improvement with Prilosec. There is also associate SOB, no palpitations or syncope, no LE edema, orthopnea, PND. No significant FH of premature CAD, she has never smoked.   Past Medical History  Diagnosis Date  . Allergy   . Hyperlipidemia   . IBS (irritable bowel syndrome)   . Lumbar disc disease   . Colon polyps   . Anxiety   . Asthma     /w resp. upset   . GERD (gastroesophageal reflux disease)   . Headache(784.0)     migraines -h/o  . Arthritis     back & knee  . Gastric polyps   . Gastroparesis     Past Surgical History  Procedure Laterality Date  . Abdominal hysterectomy    . Oophorectomy    . Carpal tunnel release      bilateral   . Esophagogastroduodenoscopy    . Rotator cuff repair      left   . Lasik    . Breast surgery      augmetation, implants- 1980  . Cholecystectomy      laparoscopic   . Bladder suspension      2007  . Total knee arthroplasty  04/15/2012    RIGHT KNEE  . Knee arthroscopy  04/15/2012    Procedure: ARTHROSCOPY KNEE;  Surgeon: [name], MD;  Location:  [facility];  Service: Orthopedics;  Laterality: Right;  . Total knee arthroplasty  04/15/2012    Procedure: TOTAL KNEE ARTHROPLASTY;  Surgeon: [name], MD;  Location: [facility];  Service: Orthopedics;  Laterality: Right;  . Dorsal compartment release Right 09/15/2014    Procedure: RIGHT WRIST DEQUERVAINS RELEASE ;  Surgeon: Kathryne Hitch, MD;  Location: [facility];  Service: Orthopedics;  Laterality: Right;     Current Outpatient Prescriptions  Medication Sig Dispense Refill  . aspirin 81 MG tablet Take 1 tablet (81 mg total) by mouth daily. 30 tablet 0  . atorvastatin (LIPITOR) 40 MG tablet Take on Tuesday and Friday 1 pill at night time. 30 tablet 11  . azelastine (ASTELIN) 137 MCG/SPRAY nasal spray Place 1 spray into the nose 2 (two) times daily.     . calcium-vitamin D (OSCAL WITH D) 500-200 MG-UNIT per tablet Take 1 tablet by mouth daily.    [name] doxycycline (VIBRAMYCIN) 100 MG capsule Take 100 mg by mouth 2 (two) times daily.     . DULoxetine (CYMBALTA) 30 MG capsule Take 1 capsule (30 mg total) by mouth 3 (three) times daily. 90 capsule 6  . fluticasone (FLONASE) 50  MCG/ACT nasal spray     . loratadine-pseudoephedrine (KLS ALLERCLEAR D-24HR) 10-240 MG per 24 hr tablet Take 1 tablet by mouth daily. 30 tablet 11  . metoCLOPramide (REGLAN) 10 MG tablet Take 1/2 tablet 4 times daily. 60 tablet 5  . nabumetone (RELAFEN) 750 MG tablet TAKE 2 TABLETS BY MOUTH EVERY DAY 60 tablet 2  . omeprazole (PRILOSEC) 40 MG capsule Take 1 capsule (40 mg total) by mouth daily. 30 capsule 11  . Probiotic Product (ALIGN) 4 MG CAPS Take by mouth.    . psyllium (METAMUCIL SMOOTH TEXTURE) 28 % packet Take 1 packet by mouth 2 (two) times daily.     . SUMAtriptan (IMITREX) 100 MG tablet TAKE 1 TABLET BY MOUTH EVERY 2 HOURS AS NEEDED FOR MIGRAINE (Patient not taking: Reported on 11/09/2014) 9 tablet 3   No current facility-administered medications for this visit.    Allergies:   Erythromycin and  Penicillins    Social History:  The patient  reports that she has never smoked. She has never used smokeless tobacco. She reports that she does not drink alcohol or use illicit drugs.   Family History:  The patient's family history includes COPD in her mother; Colon polyps in her maternal aunt and mother; Heart disease in her father; Irritable bowel syndrome in her mother.    ROS:  Please see the history of present illness.   Otherwise, review of systems are positive for none.   All other systems are reviewed and negative.    PHYSICAL EXAM: VS:  BP 124/78 mmHg  Pulse 94  Ht 5\' 6"  (1.676 m)  Wt 233 lb (105.688 kg)  BMI 37.63 kg/m2 , BMI Body mass index is 37.63 kg/(m^2). GEN: Well nourished, well developed, in no acute distress HEENT: normal Neck: no JVD, carotid bruits, or masses Cardiac: RRR; no murmurs, rubs, or gallops,no edema  Respiratory:  clear to auscultation bilaterally, normal work of breathing GI: soft, nontender, nondistended, + BS MS: no deformity or atrophy Skin: warm and dry, no rash Neuro:  Strength and sensation are intact Psych: euthymic mood, full affect  EKG:  EKG is ordered today. The ekg ordered today demonstrates SR, negative T waves in the inferior leads suggestive of ischemia  Echo: 04/2014 Left ventricle: The cavity size was normal. Wall thickness was increased in a pattern of mild LVH. Systolic function was normal. The estimated ejection fraction was in the range of 60% to 65%. Wall motion was normal; there were no regional wall motion abnormalities. Doppler parameters are consistent with abnormal left ventricular relaxation (grade 1 diastolic dysfunction). - Aortic valve: A bicuspid morphology cannot be excluded; mildly thickened, mildly calcified leaflets. There was moderate stenosis. There was mild regurgitation.  Recent Labs: 11/09/2014: ALT 14; BUN 13; Creatinine, Ser 0.64; Hemoglobin 14.5; Platelets 343.0; Potassium 4.4; Sodium  136; TSH 2.21   Lipid Panel    Component Value Date/Time   CHOL 166 05/05/2014 0920   TRIG 102.0 05/05/2014 0920   HDL 66.30 05/05/2014 0920   CHOLHDL 3 05/05/2014 0920   VLDL 20.4 05/05/2014 0920   LDLCALC 79 05/05/2014 0920   LDLDIRECT 84.0 11/09/2014 1117   Wt Readings from Last 3 Encounters:  12/22/14 233 lb (105.688 kg)  11/09/14 231 lb (104.781 kg)  09/15/14 229 lb (103.874 kg)    ECG:   ASSESSMENT AND PLAN:  1. Chest pain -order Lexiscan nuclear stress test to evaluate for ischemia, ECG suggestive of ischemia, add aspirin 81 mg o daily, continue atorvastatin  2. Hyperlipidemia - at goal with atorvastatin  3. DM - add asa 81 mg po daily   Orders Placed This Encounter  Procedures  . Myocardial Perfusion Imaging  . EKG 12-Lead   Follow up in 3 months  Signed, Dorothy Spark, MD  12/22/2014 5:44 PM    [facility] [place], [place], [place]  [address] Phone: [phone]; Fax: [phone]

## 2015-01-01 ENCOUNTER — Other Ambulatory Visit: Payer: Self-pay | Admitting: Family Medicine

## 2015-01-02 ENCOUNTER — Telehealth (HOSPITAL_COMMUNITY): Payer: Self-pay

## 2015-01-02 NOTE — Telephone Encounter (Signed)
Patient given detailed instructions per Myocardial Perfusion Study Information Sheet for test on 01-04-2015 at 0830. Patient Notified to arrive 15 minutes early, and that it is imperative to arrive on time for appointment to keep from having the test rescheduled. Patient verbalized understanding. Oletta Lamas, Taison Celani A

## 2015-01-03 ENCOUNTER — Encounter: Payer: Self-pay | Admitting: Internal Medicine

## 2015-01-04 ENCOUNTER — Ambulatory Visit (HOSPITAL_COMMUNITY): Payer: BC Managed Care – PPO | Attending: Cardiology

## 2015-01-04 DIAGNOSIS — R0789 Other chest pain: Secondary | ICD-10-CM | POA: Diagnosis not present

## 2015-01-04 DIAGNOSIS — E119 Type 2 diabetes mellitus without complications: Secondary | ICD-10-CM | POA: Diagnosis not present

## 2015-01-04 DIAGNOSIS — R0609 Other forms of dyspnea: Secondary | ICD-10-CM | POA: Insufficient documentation

## 2015-01-04 DIAGNOSIS — E785 Hyperlipidemia, unspecified: Secondary | ICD-10-CM | POA: Diagnosis not present

## 2015-01-04 MED ORDER — TECHNETIUM TC 99M SESTAMIBI GENERIC - CARDIOLITE
32.5000 | Freq: Once | INTRAVENOUS | Status: AC | PRN
  Administered 2015-01-04: 33 via INTRAVENOUS

## 2015-01-04 MED ORDER — REGADENOSON 0.4 MG/5ML IV SOLN
0.4000 mg | Freq: Once | INTRAVENOUS | Status: AC
  Administered 2015-01-04: 0.4 mg via INTRAVENOUS

## 2015-01-05 ENCOUNTER — Ambulatory Visit (HOSPITAL_COMMUNITY): Payer: BC Managed Care – PPO | Attending: Internal Medicine

## 2015-01-05 LAB — MYOCARDIAL PERFUSION IMAGING
LV dias vol: 99 mL
LV sys vol: 40 mL
Peak HR: 91 {beats}/min
RATE: 0.18
Rest HR: 78 {beats}/min
SDS: 2
SRS: 0
SSS: 2
TID: 1.12

## 2015-01-05 MED ORDER — TECHNETIUM TC 99M SESTAMIBI GENERIC - CARDIOLITE
31.5000 | Freq: Once | INTRAVENOUS | Status: AC | PRN
  Administered 2015-01-05: 32 via INTRAVENOUS

## 2015-02-06 ENCOUNTER — Encounter: Payer: Self-pay | Admitting: Family Medicine

## 2015-03-16 LAB — HM DIABETES EYE EXAM

## 2015-03-17 ENCOUNTER — Encounter: Payer: Self-pay | Admitting: Family Medicine

## 2015-03-19 ENCOUNTER — Other Ambulatory Visit: Payer: Self-pay | Admitting: Family Medicine

## 2015-03-21 ENCOUNTER — Encounter: Payer: Self-pay | Admitting: Family Medicine

## 2015-03-23 ENCOUNTER — Ambulatory Visit (INDEPENDENT_AMBULATORY_CARE_PROVIDER_SITE_OTHER): Payer: BC Managed Care – PPO | Admitting: Family Medicine

## 2015-03-23 ENCOUNTER — Encounter: Payer: Self-pay | Admitting: Family Medicine

## 2015-03-23 VITALS — BP 124/74 | HR 92 | Temp 99.1°F | Wt 238.0 lb

## 2015-03-23 DIAGNOSIS — Z23 Encounter for immunization: Secondary | ICD-10-CM | POA: Diagnosis not present

## 2015-03-23 DIAGNOSIS — E119 Type 2 diabetes mellitus without complications: Secondary | ICD-10-CM

## 2015-03-23 DIAGNOSIS — R030 Elevated blood-pressure reading, without diagnosis of hypertension: Secondary | ICD-10-CM | POA: Diagnosis not present

## 2015-03-23 DIAGNOSIS — IMO0001 Reserved for inherently not codable concepts without codable children: Secondary | ICD-10-CM

## 2015-03-23 NOTE — Patient Instructions (Addendum)
Sign release of information at the front desk for me to be able to send records to [place]   Blood pressure looks fine on check here. Not sure what drove it up earlier today. Let's have you check your blood pressure at least 3x a week at home. I want to see you back if >140/90.   Check a1c before you leave- likely to start metformin

## 2015-03-23 NOTE — Assessment & Plan Note (Signed)
S: Patient was to work on diet and exercise to avoid medicine but she admits she has done poorly with this. Weight actually up 5 lbs in 3 months. Has already been to diabetic education. No meter- no CBG checks Lab Results  Component Value Date   HGBA1C 6.9* 08/18/2014  A/P: we discussed likely starting metformin if 6.5-7.5 a1c. i suspect worsening control with weight gain

## 2015-03-23 NOTE — Progress Notes (Signed)
Garret Reddish, MD  Subjective:  [name] is a 64 y.o. year old very pleasant female patient who presents with:  Elevated blood pressure -Blood pressure at [facility] was elevated to 371I systolic and patient was sent here for further evaluation. No history hypertension and historcally blood pressure has been well controlled in our office as it is today BP Readings from Last 3 Encounters:  03/23/15 124/74  12/22/14 124/78  11/09/14 112/70   Also See problem oriented charting ROS- no chest pain, shortness of breath, nausea, vomiting, headache, blurry vision. Has had some crying spells despite being on cymbalta which she takes for GAD but admits she has been frustrated by her back pain  Past Medical History-  Patient Active Problem List   Diagnosis Date Noted  . Diabetes mellitus type 2, controlled 05/12/2014    Priority: High  . Moderate aortic stenosis 05/12/2014    Priority: Medium  . GERD (gastroesophageal reflux disease) 03/10/2014    Priority: Medium  . Generalized anxiety disorder 03/10/2014    Priority: Medium  . Migraines 03/10/2014    Priority: Medium  . Irritable bowel syndrome 12/15/2008    Priority: Medium  . OBESITY, MORBID 10/12/2008    Priority: Medium  . Hyperlipidemia 02/09/2008    Priority: Medium  . Acne 05/12/2014    Priority: Low  . Acute sinusitis 08/08/2012    Priority: Low  . EUSTACHIAN TUBE DYSFUNCTION, CHRONIC 06/14/2010    Priority: Low  . Low back pain 08/11/2008    Priority: Low  . ALLERGIC RHINITIS 02/12/2007    Priority: Low  . ASTHMA 02/12/2007    Priority: Low  . Atypical chest pain 11/10/2014    Medications- reviewed and updated Current Outpatient Prescriptions  Medication Sig Dispense Refill  . aspirin 81 MG tablet Take 1 tablet (81 mg total) by mouth daily. 30 tablet 0  . atorvastatin (LIPITOR) 40 MG tablet Take on Tuesday and Friday 1 pill at night time. 30 tablet 11  . azelastine (ASTELIN) 137 MCG/SPRAY nasal spray Place 1  spray into the nose 2 (two) times daily.     . calcium-vitamin D (OSCAL WITH D) 500-200 MG-UNIT per tablet Take 1 tablet by mouth daily.    . DULoxetine (CYMBALTA) 30 MG capsule TAKE 1 CAPSULE (30 MG TOTAL) BY MOUTH 3 (THREE) TIMES DAILY. 90 capsule 6  . fluticasone (FLONASE) 50 MCG/ACT nasal spray     . loratadine-pseudoephedrine (KLS ALLERCLEAR D-24HR) 10-240 MG per 24 hr tablet Take 1 tablet by mouth daily. 30 tablet 11  . metoCLOPramide (REGLAN) 10 MG tablet Take 1/2 tablet 4 times daily. 60 tablet 5  . nabumetone (RELAFEN) 750 MG tablet TAKE 2 TABLETS BY MOUTH EVERY DAY 60 tablet 3  . omeprazole (PRILOSEC) 40 MG capsule Take 1 capsule (40 mg total) by mouth daily. 30 capsule 11  . Probiotic Product (ALIGN) 4 MG CAPS Take by mouth.    . psyllium (METAMUCIL SMOOTH TEXTURE) 28 % packet Take 1 packet by mouth 2 (two) times daily.     [name] doxycycline (VIBRAMYCIN) 100 MG capsule Take 100 mg by mouth 2 (two) times daily.     . SUMAtriptan (IMITREX) 100 MG tablet TAKE 1 TABLET BY MOUTH EVERY 2 HOURS AS NEEDED FOR MIGRAINE (Patient not taking: Reported on 11/09/2014) 9 tablet 3   No current facility-administered medications for this visit.    Objective: BP 124/74 mmHg  Pulse 92  Temp(Src) 99.1 F (37.3 C)  Wt 238 lb (107.956 kg) Gen: NAD,  resting comfortably CV: RRR 3/6 SEM RUSB unchanged. no rubs or gallops Lungs: CTAB no crackles, wheeze, rhonchi Abdomen: soft/nontender/nondistended/normal bowel sounds. No rebound or guarding.  Ext: no edema Skin: warm, dry Neuro: grossly normal, moves all extremities  Assessment/Plan:  Diabetes mellitus type 2, controlled S: Patient was to work on diet and exercise to avoid medicine but she admits she has done poorly with this. Weight actually up 5 lbs in 3 months. Has already been to diabetic education. No meter- no CBG checks Lab Results  Component Value Date   HGBA1C 6.9* 08/18/2014  A/P: we discussed likely starting metformin if 6.5-7.5 a1c. i  suspect worsening control with weight gain   Elevated blood pressure Controlled on recheck here today x 2. Unclear what caused elevation at home- we will monitor home readings at least 3x a week and follow up if >140/90.   Forwarding note to Dr. Verner Mould ob/gyn   Return precautions advised.   Orders Placed This Encounter  Procedures  . Flu Vaccine QUAD 36+ mos IM  . Hemoglobin A1c    [place]

## 2015-03-24 ENCOUNTER — Ambulatory Visit: Payer: BC Managed Care – PPO | Admitting: Cardiology

## 2015-03-24 ENCOUNTER — Encounter: Payer: Self-pay | Admitting: Family Medicine

## 2015-03-24 LAB — HEMOGLOBIN A1C: Hgb A1c MFr Bld: 7.1 % — ABNORMAL HIGH (ref 4.6–6.5)

## 2015-03-24 MED ORDER — METFORMIN HCL 500 MG PO TABS: 500.0000 mg | ORAL_TABLET | Freq: Every day | ORAL | Status: DC

## 2015-03-24 NOTE — Addendum Note (Signed)
Addended by: [name] on: 03/24/2015 04:18 PM   Modules accepted: Orders

## 2015-03-26 ENCOUNTER — Encounter: Payer: Self-pay | Admitting: Family Medicine

## 2015-03-27 ENCOUNTER — Encounter: Payer: Self-pay | Admitting: *Deleted

## 2015-03-27 ENCOUNTER — Encounter: Payer: Self-pay | Admitting: Family Medicine

## 2015-03-28 ENCOUNTER — Ambulatory Visit (INDEPENDENT_AMBULATORY_CARE_PROVIDER_SITE_OTHER): Payer: BC Managed Care – PPO | Admitting: Cardiology

## 2015-03-28 ENCOUNTER — Encounter: Payer: Self-pay | Admitting: Cardiology

## 2015-03-28 VITALS — BP 134/74 | HR 88 | Ht 66.0 in | Wt 236.0 lb

## 2015-03-28 DIAGNOSIS — E785 Hyperlipidemia, unspecified: Secondary | ICD-10-CM | POA: Diagnosis not present

## 2015-03-28 DIAGNOSIS — R0789 Other chest pain: Secondary | ICD-10-CM

## 2015-03-28 DIAGNOSIS — I350 Nonrheumatic aortic (valve) stenosis: Secondary | ICD-10-CM

## 2015-03-28 NOTE — Patient Instructions (Signed)
Medication Instructions:   Your physician recommends that you continue on your current medications as directed. Please refer to the Current Medication list given to you today.      Testing/Procedures:  Your physician has requested that you have an echocardiogram. Echocardiography is a painless test that uses sound waves to create images of your heart. It provides your doctor with information about the size and shape of your heart and how well your heart's chambers and valves are working. This procedure takes approximately one hour. There are no restrictions for this procedure.     Follow-Up:  Your physician wants you to follow-up in: ONE YEAR WITH DR NELSON You will receive a reminder letter in the mail two months in advance. If you don't receive a letter, please call our office to schedule the follow-up appointment.      

## 2015-03-28 NOTE — Progress Notes (Signed)
Patient ID: [name], female   DOB: 1950/10/03, 64 y.o.   MRN: [id number]      Cardiology Office Note  Date:  03/28/2015   ID:  [name], DOB [birth year], MRN [id number]  PCP:  Garret Reddish, MD  Cardiologist:  Dorothy Spark, MD   Chief complain: chest pain  History of Present Illness: [name] is a 64 y.o. female who presents for evaluation of chest pain.The patient has h/o DM, HLP that has developed exertional chest pain intermittent chest pain, treated for GERD, but this pain feels differently. The pain is increasing in frequency now to once a week. Used to just occur quickly and would take breath away and would feel some radiation to back or left shoulder. Most recent one, woke her up from sleep. The last episode happen before eating at a restaurant, lasted for about 30 minutes and resolved, no obvious alleviating or provoking factors. No improvement with Prilosec. There is also associate SOB, no palpitations or syncope, no LE edema, orthopnea, PND. No significant FH of premature CAD, she has never smoked.   03/28/2015 - the patient is coming after 1 year, she had a negative stress test a year ago. No prior infarct and no ischemia. Echocardiogram showed moderate aortic stenosis. Today she states that she hasn't experienced chest pain since then, her major limitation is back pain, minimal DOE, no palpitations or syncope, no LE edema, orthopnea or PND.  Past Medical History  Diagnosis Date  . Allergy   . Hyperlipidemia   . IBS (irritable bowel syndrome)   . Lumbar disc disease   . Colon polyps   . Anxiety   . Asthma     /w resp. upset   . GERD (gastroesophageal reflux disease)   . History of migraine headaches   . Arthritis     back & knee  . Gastric polyps   . Gastroparesis    Past Surgical History  Procedure Laterality Date  . Abdominal hysterectomy    . Oophorectomy    . Carpal tunnel release Bilateral   . Esophagogastroduodenoscopy      . Rotator cuff repair Left   . Lasik    . Breast enhancement surgery  1980  . Laparoscopic cholecystectomy    . Bladder suspension  2007  . Knee arthroscopy  04/15/2012    Procedure: ARTHROSCOPY KNEE;  Surgeon: [name], MD;  Location: [facility];  Service: Orthopedics;  Laterality: Right;  . Total knee arthroplasty  04/15/2012    Procedure: TOTAL KNEE ARTHROPLASTY;  Surgeon: [name], MD;  Location: [facility];  Service: Orthopedics;  Laterality: Right;  . Dorsal compartment release Right 09/15/2014    Procedure: RIGHT WRIST DEQUERVAINS RELEASE ;  Surgeon: Kathryne Hitch, MD;  Location: [facility];  Service: Orthopedics;  Laterality: Right;   Current Outpatient Prescriptions  Medication Sig Dispense Refill  . aspirin 81 MG tablet Take 1 tablet (81 mg total) by mouth daily. 30 tablet 0  . atorvastatin (LIPITOR) 40 MG tablet Take on Tuesday and Friday 1 pill at night time. 30 tablet 11  . azelastine (ASTELIN) 137 MCG/SPRAY nasal spray Place 1 spray into the nose 2 (two) times daily.     . calcium-vitamin D (OSCAL WITH D) 500-200 MG-UNIT per tablet Take 1 tablet by mouth daily.    [name] doxycycline (VIBRAMYCIN) 100 MG capsule Take 100 mg by mouth 2 (two) times daily.     . DULoxetine (CYMBALTA) 30 MG capsule  TAKE 1 CAPSULE (30 MG TOTAL) BY MOUTH 3 (THREE) TIMES DAILY. 90 capsule 6  . fluticasone (FLONASE) 50 MCG/ACT nasal spray     . loratadine-pseudoephedrine (KLS ALLERCLEAR D-24HR) 10-240 MG per 24 hr tablet Take 1 tablet by mouth daily. 30 tablet 11  . metoCLOPramide (REGLAN) 10 MG tablet Take 1/2 tablet 4 times daily. 60 tablet 5  . nabumetone (RELAFEN) 750 MG tablet TAKE 2 TABLETS BY MOUTH EVERY DAY 60 tablet 3  . omeprazole (PRILOSEC) 40 MG capsule Take 1 capsule (40 mg total) by mouth daily. 30 capsule 11  . Probiotic Product (ALIGN) 4 MG CAPS Take by mouth.    . psyllium (METAMUCIL SMOOTH TEXTURE) 28 % packet Take 1 packet by mouth 2 (two) times daily.     .  SUMAtriptan (IMITREX) 100 MG tablet TAKE 1 TABLET BY MOUTH EVERY 2 HOURS AS NEEDED FOR MIGRAINE 9 tablet 3  . metFORMIN (GLUCOPHAGE) 500 MG tablet Take 1 tablet (500 mg total) by mouth daily with breakfast. (Patient not taking: Reported on 03/28/2015) 30 tablet 5   No current facility-administered medications for this visit.   Allergies:   Erythromycin and Penicillins   Social History:  The patient  reports that she has never smoked. She has never used smokeless tobacco. She reports that she does not drink alcohol or use illicit drugs.   Family History:  The patient's family history includes Alcohol abuse in her father; COPD in her mother; Colon polyps in her maternal aunt and mother; Heart disease in her father; Irritable bowel syndrome in her mother.   ROS:  Please see the history of present illness.   Otherwise, review of systems are positive for none.   All other systems are reviewed and negative.   PHYSICAL EXAM: VS:  BP 134/74 mmHg  Pulse 88  Ht 5\' 6"  (1.676 m)  Wt 236 lb (107.049 kg)  BMI 38.11 kg/m2  SpO2 97% , BMI Body mass index is 38.11 kg/(m^2). GEN: Well nourished, well developed, in no acute distress HEENT: normal Neck: no JVD, carotid bruits, or masses Cardiac: RRR; 4/6 systolic murmur, rubs, or gallops,no edema  Respiratory:  clear to auscultation bilaterally, normal work of breathing GI: soft, nontender, nondistended, + BS MS: no deformity or atrophy Skin: warm and dry, no rash Neuro:  Strength and sensation are intact Psych: euthymic mood, full affect  EKG:  EKG is ordered today. The ekg ordered today demonstrates SR, negative T waves in the inferior leads suggestive of ischemia  Echo: 04/2014 Left ventricle: The cavity size was normal. Wall thickness was increased in a pattern of mild LVH. Systolic function was normal. The estimated ejection fraction was in the range of 60% to 65%. Wall motion was normal; there were no regional wall  motion abnormalities. Doppler parameters are consistent with abnormal left ventricular relaxation (grade 1 diastolic dysfunction). - Aortic valve: A bicuspid morphology cannot be excluded; mildly thickened, mildly calcified leaflets. There was moderate stenosis. There was mild regurgitation.  Recent Labs: 11/09/2014: ALT 14; BUN 13; Creatinine, Ser 0.64; Hemoglobin 14.5; Platelets 343.0; Potassium 4.4; Sodium 136; TSH 2.21   Lipid Panel    Component Value Date/Time   CHOL 166 05/05/2014 0920   TRIG 102.0 05/05/2014 0920   HDL 66.30 05/05/2014 0920   CHOLHDL 3 05/05/2014 0920   VLDL 20.4 05/05/2014 0920   LDLCALC 79 05/05/2014 0920   LDLDIRECT 84.0 11/09/2014 1117   Wt Readings from Last 3 Encounters:  03/28/15 236 lb (107.049 kg)  03/23/15 238 lb (107.956 kg)  01/04/15 233 lb (105.688 kg)    Lexiscan nuclear stress test: 12/2014  Nuclear stress EF: 60%.  The study is normal.  This is a low risk study.  The left ventricular ejection fraction is normal (55-65%). Normal resting and stress perfusion EF 60%.     ASSESSMENT AND PLAN:  1.Moderate aortic stenosis - most probably bicuspid aortic valve, aortic root and ascending aorta normal size, we will repeat echocardiogram, stable symptoms.  2. Chest pain - normal Lexiscan nuclear stress test in 12/2014.  3. Hyperlipidemia - at goal with atorvastatin  4. DM - continue asa 81 mg po daily and statin, LDL 84.  Follow up in 1 year if AS remains in moderate range.  Signed, Dorothy Spark, MD  03/28/2015 12:03 PM    [facility] [place], [place], [place]  [address] Phone: [phone]; Fax: [phone]

## 2015-04-04 ENCOUNTER — Ambulatory Visit (HOSPITAL_COMMUNITY): Payer: BC Managed Care – PPO | Attending: Cardiovascular Disease

## 2015-04-04 ENCOUNTER — Encounter: Payer: Self-pay | Admitting: Family Medicine

## 2015-04-04 ENCOUNTER — Other Ambulatory Visit: Payer: Self-pay

## 2015-04-04 DIAGNOSIS — I350 Nonrheumatic aortic (valve) stenosis: Secondary | ICD-10-CM

## 2015-04-04 DIAGNOSIS — E119 Type 2 diabetes mellitus without complications: Secondary | ICD-10-CM | POA: Diagnosis not present

## 2015-04-04 DIAGNOSIS — I517 Cardiomegaly: Secondary | ICD-10-CM | POA: Diagnosis not present

## 2015-04-04 DIAGNOSIS — I700 Atherosclerosis of aorta: Secondary | ICD-10-CM | POA: Insufficient documentation

## 2015-04-04 DIAGNOSIS — F17200 Nicotine dependence, unspecified, uncomplicated: Secondary | ICD-10-CM | POA: Diagnosis not present

## 2015-04-04 DIAGNOSIS — E785 Hyperlipidemia, unspecified: Secondary | ICD-10-CM | POA: Insufficient documentation

## 2015-04-04 DIAGNOSIS — R0789 Other chest pain: Secondary | ICD-10-CM

## 2015-04-05 ENCOUNTER — Encounter: Payer: Self-pay | Admitting: Family Medicine

## 2015-04-06 ENCOUNTER — Telehealth: Payer: Self-pay | Admitting: Family Medicine

## 2015-04-06 ENCOUNTER — Other Ambulatory Visit: Payer: Self-pay | Admitting: Family Medicine

## 2015-04-06 DIAGNOSIS — R0683 Snoring: Secondary | ICD-10-CM

## 2015-04-06 MED ORDER — GLUCOSE BLOOD VI STRP: ORAL_STRIP | Status: DC

## 2015-04-06 MED ORDER — ONETOUCH ULTRASOFT LANCETS MISC: Status: DC

## 2015-04-06 MED ORDER — ONETOUCH VERIO W/DEVICE KIT: 1.0000 | PACK | Freq: Once | Status: DC

## 2015-04-06 NOTE — Telephone Encounter (Signed)
Medication sent in. 

## 2015-04-06 NOTE — Telephone Encounter (Signed)
Pt instructed to check BS and pt needs a rx for a meter, lancets and strips to go with it. This is all new to pt, but dr hunter wanted her to start checking.  Pt needs a script in order for insurance to pay  [facility]

## 2015-04-06 NOTE — Telephone Encounter (Signed)
Please place referral to sleep medicine under snoring and mychart message patient that we have done this. Thanks!

## 2015-04-18 ENCOUNTER — Other Ambulatory Visit: Payer: Self-pay | Admitting: Physician Assistant

## 2015-04-18 NOTE — H&P (Signed)
  [name] comes in for a new problem.  Acute onset of triggering, right thumb.  This has been present a couple of weeks.  She just woke up one morning and it was doing this.  Getting worse rather than better.  She feels like the thumb is popping in and out of joint.  Presents for evaluation.   Of note, she is one year out from total knee replacement.  She is doing great.  Pleased with her result and she has no issues. She is also status post de Quervain's tenosynovitis release by me in March of this year, which is doing well without any issues. History and general exam is reviewed.   EXAMINATION: Specifically, her right knee has good alignment.  Good stability.  Good strength.  Motion 0-120.  Right wrist has no recurrent de Quervain's.  Obvious triggering A1 pulley right thumb.  No other neurovascular compromise.    X-RAYS: Three view x-rays of her knee shows good seating and alignment of all of her components.  You can see the area where we treated her inferior pole patella fracture.  Nothing acute.   Three view x-rays of her hand shows no calcification.  There are some diffuse degenerative changes, but nothing acute.   DISPOSITION:  1. In regards to her knee, doing well.  Routine follow up in one year.   2. In regards to her hand, she has an obvious triggering A1 pulley, right thumb.  We have talked about treatment.  We are going to try one injection as this is relatively acute.  If this persists the next step would be A1 pulley release and we have discussed what is involved with that.  She will let me know after she sees the results of the injection.    PROCEDURE NOTE: The patient's clinical condition is marked by substantial pain and/or significant functional disability.  Other conservative therapy has not provided relief, is contraindicated, or not appropriate.  There is a reasonable likelihood that injection will significantly improve the patient's pain and/or functional disability. After  appropriate consent and under sterile technique A1 pulley, right thumb, injected with 20 mg of Depo-Medrol and Marcaine.  Tolerated this well.       [name], M.D. Electronically verified by [name], M.D. DFM:jjh  Addendum:  [name] did not sustain long-term relief after cortisone injection to right trigger thumb.  She has elected for surgical intervention of A-1 pulley release right thumb.  Risks, benefits and possible complications reviewed.  Rehab and recovery time discussed.  All questions answered.  We will see her at time of operative intervention.

## 2015-04-19 ENCOUNTER — Encounter (HOSPITAL_BASED_OUTPATIENT_CLINIC_OR_DEPARTMENT_OTHER): Payer: Self-pay | Admitting: *Deleted

## 2015-04-19 NOTE — Progress Notes (Signed)
Chart reviewed by Dr Rodman Comp, due to pt's recent ECHO results showing severe aortic stenosis, she will need to be done at main OR. Dr Debroah Loop office notified.

## 2015-04-19 NOTE — Consult Note (Signed)
Previously discussed pt with Elise Benne in regards to being candidate for [facility] in setting of new echo with progressive aortic stenosis. Pt with moderate stenosis by mean gradient with normal EF on 04/04/2015 TTE, but severe stenosis by other measurements. Pt with recent (12/2014) myoview without evidence for ischemia. Originally, because of progressive mod-severe AS and pt booked as general anesthesia I felt it more appropriate for case to be done at main hospital. Spoke with Dr. Percell Miller and he is okay doing procedure under IV regional block and sedation. In light of surgeons willingness to do case under Beir block with sedation and appropriate cardiology follow-up with moderate AS as diagnosed by cardiologist, pt is felt to be outpatient surgery candidate assuming no active cardiac complaints on the day of surgery.  Suzette Battiest, MD

## 2015-04-20 ENCOUNTER — Ambulatory Visit (HOSPITAL_BASED_OUTPATIENT_CLINIC_OR_DEPARTMENT_OTHER): Payer: BC Managed Care – PPO | Admitting: Anesthesiology

## 2015-04-20 ENCOUNTER — Encounter (HOSPITAL_BASED_OUTPATIENT_CLINIC_OR_DEPARTMENT_OTHER)
Admission: RE | Disposition: A | Discharge status: Home / Self Care | Payer: Self-pay | Source: Ambulatory Visit | Attending: Orthopedic Surgery

## 2015-04-20 ENCOUNTER — Ambulatory Visit (HOSPITAL_BASED_OUTPATIENT_CLINIC_OR_DEPARTMENT_OTHER)
Admission: RE | Admit: 2015-04-20 | Discharge: 2015-04-20 | Disposition: A | Discharge status: Home / Self Care | Payer: BC Managed Care – PPO | Source: Ambulatory Visit | Attending: Orthopedic Surgery | Admitting: Orthopedic Surgery

## 2015-04-20 ENCOUNTER — Encounter (HOSPITAL_BASED_OUTPATIENT_CLINIC_OR_DEPARTMENT_OTHER): Payer: Self-pay | Admitting: Certified Registered"

## 2015-04-20 DIAGNOSIS — Z7984 Long term (current) use of oral hypoglycemic drugs: Secondary | ICD-10-CM | POA: Diagnosis not present

## 2015-04-20 DIAGNOSIS — M65311 Trigger thumb, right thumb: Secondary | ICD-10-CM | POA: Insufficient documentation

## 2015-04-20 DIAGNOSIS — Z79899 Other long term (current) drug therapy: Secondary | ICD-10-CM | POA: Insufficient documentation

## 2015-04-20 DIAGNOSIS — Z6836 Body mass index (BMI) 36.0-36.9, adult: Secondary | ICD-10-CM | POA: Insufficient documentation

## 2015-04-20 DIAGNOSIS — I350 Nonrheumatic aortic (valve) stenosis: Secondary | ICD-10-CM | POA: Insufficient documentation

## 2015-04-20 DIAGNOSIS — K219 Gastro-esophageal reflux disease without esophagitis: Secondary | ICD-10-CM | POA: Insufficient documentation

## 2015-04-20 DIAGNOSIS — Z7982 Long term (current) use of aspirin: Secondary | ICD-10-CM | POA: Insufficient documentation

## 2015-04-20 DIAGNOSIS — E119 Type 2 diabetes mellitus without complications: Secondary | ICD-10-CM | POA: Diagnosis not present

## 2015-04-20 HISTORY — DX: Type 2 diabetes mellitus without complications: E11.9

## 2015-04-20 HISTORY — PX: TRIGGER FINGER RELEASE: SHX641

## 2015-04-20 LAB — POCT I-STAT, CHEM 8
BUN: 16 mg/dL (ref 6–20)
Calcium, Ion: 1.14 mmol/L (ref 1.13–1.30)
Chloride: 101 mmol/L (ref 101–111)
Creatinine, Ser: 0.6 mg/dL (ref 0.44–1.00)
Glucose, Bld: 153 mg/dL — ABNORMAL HIGH (ref 65–99)
HCT: 41 % (ref 36.0–46.0)
Hemoglobin: 13.9 g/dL (ref 12.0–15.0)
Potassium: 4.6 mmol/L (ref 3.5–5.1)
Sodium: 138 mmol/L (ref 135–145)
TCO2: 24 mmol/L (ref 0–100)

## 2015-04-20 LAB — GLUCOSE, CAPILLARY: Glucose-Capillary: 118 mg/dL — ABNORMAL HIGH (ref 65–99)

## 2015-04-20 SURGERY — RELEASE, A1 PULLEY, FOR TRIGGER FINGER
Anesthesia: Monitor Anesthesia Care | Site: Finger | Laterality: Right

## 2015-04-20 MED ORDER — ONDANSETRON HCL 4 MG PO TABS: 4.0000 mg | ORAL_TABLET | Freq: Three times a day (TID) | ORAL | Status: DC | PRN

## 2015-04-20 MED ORDER — PROPOFOL 10 MG/ML IV BOLUS
INTRAVENOUS | Status: AC
  Filled 2015-04-20: qty 20

## 2015-04-20 MED ORDER — BUPIVACAINE HCL (PF) 0.5 % IJ SOLN
INTRAMUSCULAR | Status: AC
  Filled 2015-04-20: qty 30

## 2015-04-20 MED ORDER — BUPIVACAINE HCL (PF) 0.5 % IJ SOLN
INTRAMUSCULAR | Status: DC | PRN
  Administered 2015-04-20: 5 mL

## 2015-04-20 MED ORDER — SCOPOLAMINE 1 MG/3DAYS TD PT72: 1.0000 | MEDICATED_PATCH | Freq: Once | TRANSDERMAL | Status: DC | PRN

## 2015-04-20 MED ORDER — METHOCARBAMOL 500 MG PO TABS: 500.0000 mg | ORAL_TABLET | Freq: Four times a day (QID) | ORAL | Status: DC | PRN

## 2015-04-20 MED ORDER — METOCLOPRAMIDE HCL 5 MG PO TABS: 5.0000 mg | ORAL_TABLET | Freq: Three times a day (TID) | ORAL | Status: DC | PRN

## 2015-04-20 MED ORDER — METOCLOPRAMIDE HCL 5 MG/ML IJ SOLN: 5.0000 mg | Freq: Three times a day (TID) | INTRAMUSCULAR | Status: DC | PRN

## 2015-04-20 MED ORDER — LACTATED RINGERS IV SOLN
INTRAVENOUS | Status: DC
  Administered 2015-04-20: 10:00:00 via INTRAVENOUS

## 2015-04-20 MED ORDER — BUPIVACAINE HCL (PF) 0.25 % IJ SOLN
INTRAMUSCULAR | Status: AC
  Filled 2015-04-20: qty 30

## 2015-04-20 MED ORDER — ONDANSETRON HCL 4 MG/2ML IJ SOLN: 4.0000 mg | Freq: Four times a day (QID) | INTRAMUSCULAR | Status: DC | PRN

## 2015-04-20 MED ORDER — OXYCODONE-ACETAMINOPHEN 5-325 MG PO TABS: 1.0000 | ORAL_TABLET | ORAL | Status: DC | PRN

## 2015-04-20 MED ORDER — FENTANYL CITRATE (PF) 100 MCG/2ML IJ SOLN
50.0000 ug | INTRAMUSCULAR | Status: DC | PRN
  Administered 2015-04-20 (×2): 50 ug via INTRAVENOUS

## 2015-04-20 MED ORDER — FENTANYL CITRATE (PF) 100 MCG/2ML IJ SOLN
INTRAMUSCULAR | Status: AC
  Filled 2015-04-20: qty 4

## 2015-04-20 MED ORDER — HYDROMORPHONE HCL 1 MG/ML IJ SOLN: 0.5000 mg | INTRAMUSCULAR | Status: DC | PRN

## 2015-04-20 MED ORDER — LIDOCAINE HCL (PF) 0.5 % IJ SOLN
INTRAMUSCULAR | Status: DC | PRN
  Administered 2015-04-20: 30 mL via INTRAVENOUS

## 2015-04-20 MED ORDER — DEXAMETHASONE SODIUM PHOSPHATE 10 MG/ML IJ SOLN
INTRAMUSCULAR | Status: AC
  Filled 2015-04-20: qty 1

## 2015-04-20 MED ORDER — ONDANSETRON HCL 4 MG/2ML IJ SOLN
INTRAMUSCULAR | Status: AC
  Filled 2015-04-20: qty 2

## 2015-04-20 MED ORDER — MIDAZOLAM HCL 2 MG/2ML IJ SOLN
INTRAMUSCULAR | Status: AC
  Filled 2015-04-20: qty 4

## 2015-04-20 MED ORDER — MIDAZOLAM HCL 2 MG/2ML IJ SOLN
1.0000 mg | INTRAMUSCULAR | Status: DC | PRN
  Administered 2015-04-20: 1 mg via INTRAVENOUS

## 2015-04-20 MED ORDER — CHLORHEXIDINE GLUCONATE 4 % EX LIQD: 60.0000 mL | Freq: Once | CUTANEOUS | Status: DC

## 2015-04-20 MED ORDER — HYDROMORPHONE HCL 1 MG/ML IJ SOLN: 0.2500 mg | INTRAMUSCULAR | Status: DC | PRN

## 2015-04-20 MED ORDER — OXYCODONE HCL 5 MG/5ML PO SOLN: 5.0000 mg | Freq: Once | ORAL | Status: DC | PRN

## 2015-04-20 MED ORDER — CLINDAMYCIN PHOSPHATE 900 MG/50ML IV SOLN
900.0000 mg | INTRAVENOUS | Status: AC
  Administered 2015-04-20: 900 mg via INTRAVENOUS

## 2015-04-20 MED ORDER — CLINDAMYCIN PHOSPHATE 900 MG/50ML IV SOLN
INTRAVENOUS | Status: AC
  Filled 2015-04-20: qty 50

## 2015-04-20 MED ORDER — LIDOCAINE HCL (CARDIAC) 20 MG/ML IV SOLN
INTRAVENOUS | Status: AC
  Filled 2015-04-20: qty 5

## 2015-04-20 MED ORDER — ONDANSETRON HCL 4 MG/2ML IJ SOLN
INTRAMUSCULAR | Status: DC | PRN
  Administered 2015-04-20: 4 mg via INTRAVENOUS

## 2015-04-20 MED ORDER — LACTATED RINGERS IV SOLN: INTRAVENOUS | Status: DC

## 2015-04-20 MED ORDER — PROPOFOL 10 MG/ML IV BOLUS
INTRAVENOUS | Status: DC | PRN
Start: 2015-04-20 — End: 2015-04-20
  Administered 2015-04-20 (×2): 20 mg via INTRAVENOUS
  Administered 2015-04-20: 10 mg via INTRAVENOUS
  Administered 2015-04-20: 20 mg via INTRAVENOUS

## 2015-04-20 MED ORDER — OXYCODONE HCL 5 MG PO TABS: 5.0000 mg | ORAL_TABLET | Freq: Once | ORAL | Status: DC | PRN

## 2015-04-20 MED ORDER — ONDANSETRON HCL 4 MG PO TABS
4.0000 mg | ORAL_TABLET | Freq: Four times a day (QID) | ORAL | Status: DC | PRN
Start: 2015-04-20 — End: 2015-04-20

## 2015-04-20 MED ORDER — MEPERIDINE HCL 25 MG/ML IJ SOLN: 6.2500 mg | INTRAMUSCULAR | Status: DC | PRN

## 2015-04-20 MED ORDER — METHOCARBAMOL 1000 MG/10ML IJ SOLN: 500.0000 mg | Freq: Four times a day (QID) | INTRAVENOUS | Status: DC | PRN

## 2015-04-20 MED ORDER — GLYCOPYRROLATE 0.2 MG/ML IJ SOLN: 0.2000 mg | Freq: Once | INTRAMUSCULAR | Status: DC | PRN

## 2015-04-20 SURGICAL SUPPLY — 44 items
BANDAGE ELASTIC 3 VELCRO ST LF (GAUZE/BANDAGES/DRESSINGS) ×2 IMPLANT
BLADE SURG 15 STRL LF DISP TIS (BLADE) ×1 IMPLANT
BLADE SURG 15 STRL SS (BLADE) ×2
BNDG CMPR 9X4 STRL LF SNTH (GAUZE/BANDAGES/DRESSINGS)
BNDG COHESIVE 3X5 TAN STRL LF (GAUZE/BANDAGES/DRESSINGS) ×2 IMPLANT
BNDG ESMARK 4X9 LF (GAUZE/BANDAGES/DRESSINGS) IMPLANT
CORDS BIPOLAR (ELECTRODE) IMPLANT
COVER BACK TABLE 60X90IN (DRAPES) ×2 IMPLANT
COVER MAYO STAND STRL (DRAPES) ×2 IMPLANT
CUFF TOURNIQUET SINGLE 18IN (TOURNIQUET CUFF) IMPLANT
DRAPE EXTREMITY T 121X128X90 (DRAPE) ×2 IMPLANT
DRAPE SURG 17X23 STRL (DRAPES) ×2 IMPLANT
DRSG PAD ABDOMINAL 8X10 ST (GAUZE/BANDAGES/DRESSINGS) ×2 IMPLANT
DURAPREP 26ML APPLICATOR (WOUND CARE) ×2 IMPLANT
GAUZE SPONGE 4X4 12PLY STRL (GAUZE/BANDAGES/DRESSINGS) ×2 IMPLANT
GAUZE XEROFORM 1X8 LF (GAUZE/BANDAGES/DRESSINGS) ×2 IMPLANT
GLOVE BIO SURGEON STRL SZ 6.5 (GLOVE) ×1 IMPLANT
GLOVE BIOGEL PI IND STRL 7.0 (GLOVE) ×1 IMPLANT
GLOVE BIOGEL PI INDICATOR 7.0 (GLOVE) ×3
GLOVE ECLIPSE 7.0 STRL STRAW (GLOVE) ×2 IMPLANT
GLOVE SURG ORTHO 8.0 STRL STRW (GLOVE) ×2 IMPLANT
GOWN STRL REUS W/ TWL LRG LVL3 (GOWN DISPOSABLE) ×2 IMPLANT
GOWN STRL REUS W/ TWL XL LVL3 (GOWN DISPOSABLE) ×1 IMPLANT
GOWN STRL REUS W/TWL LRG LVL3 (GOWN DISPOSABLE) ×4
GOWN STRL REUS W/TWL XL LVL3 (GOWN DISPOSABLE) ×2
NDL HYPO 25X1 1.5 SAFETY (NEEDLE) ×1 IMPLANT
NEEDLE HYPO 25X1 1.5 SAFETY (NEEDLE) ×2 IMPLANT
NS IRRIG 1000ML POUR BTL (IV SOLUTION) ×2 IMPLANT
PACK BASIN DAY SURGERY FS (CUSTOM PROCEDURE TRAY) ×2 IMPLANT
PAD CAST 3X4 CTTN HI CHSV (CAST SUPPLIES) ×2 IMPLANT
PADDING CAST ABS 3INX4YD NS (CAST SUPPLIES) ×1
PADDING CAST ABS 4INX4YD NS (CAST SUPPLIES) ×1
PADDING CAST ABS COTTON 3X4 (CAST SUPPLIES) ×1 IMPLANT
PADDING CAST ABS COTTON 4X4 ST (CAST SUPPLIES) ×1 IMPLANT
PADDING CAST COTTON 3X4 STRL (CAST SUPPLIES) ×2
SPLINT FIBERGLASS 3X35 (CAST SUPPLIES) ×2 IMPLANT
SPLINT PLASTER CAST XFAST 3X15 (CAST SUPPLIES) IMPLANT
SPLINT PLASTER XTRA FASTSET 3X (CAST SUPPLIES)
STOCKINETTE 4X48 STRL (DRAPES) ×2 IMPLANT
SUT ETHILON 3 0 PS 1 (SUTURE) ×2 IMPLANT
SYR BULB 3OZ (MISCELLANEOUS) ×2 IMPLANT
SYR CONTROL 10ML LL (SYRINGE) ×2 IMPLANT
TOWEL OR 17X24 6PK STRL BLUE (TOWEL DISPOSABLE) ×2 IMPLANT
UNDERPAD 30X30 (UNDERPADS AND DIAPERS) ×2 IMPLANT

## 2015-04-20 NOTE — Anesthesia Preprocedure Evaluation (Signed)
Anesthesia Evaluation  Patient identified by MRN, date of birth, ID band Patient awake    Reviewed: Allergy & Precautions, NPO status , Patient's Chart, lab work & pertinent test results  Airway Mallampati: I  TM Distance: >3 FB Neck ROM: Full    Dental  (+) Teeth Intact, Dental Advisory Given   Pulmonary    breath sounds clear to auscultation       Cardiovascular + Valvular Problems/Murmurs (see Echo of 10/16) AS  Rhythm:Regular Rate:Normal     Neuro/Psych    GI/Hepatic GERD  Medicated and Controlled,  Endo/Other  diabetes, Well Controlled, Type 2, Oral Hypoglycemic AgentsMorbid obesity  Renal/GU      Musculoskeletal   Abdominal   Peds  Hematology   Anesthesia Other Findings   Reproductive/Obstetrics                             Anesthesia Physical Anesthesia Plan  ASA: III  Anesthesia Plan: MAC and Bier Block   Post-op Pain Management:    Induction: Intravenous  Airway Management Planned: Simple Face Mask  Additional Equipment:   Intra-op Plan:   Post-operative Plan:   Informed Consent: I have reviewed the patients History and Physical, chart, labs and discussed the procedure including the risks, benefits and alternatives for the proposed anesthesia with the patient or authorized representative who has indicated his/her understanding and acceptance.   Dental advisory given  Plan Discussed with: CRNA, Anesthesiologist and Surgeon  Anesthesia Plan Comments:         Anesthesia Quick Evaluation

## 2015-04-20 NOTE — Anesthesia Postprocedure Evaluation (Signed)
  Anesthesia Post-op Note  Patient: [name]  Procedure(s) Performed: Procedure(s): RIGHT TRIGGER FINGER RELEASE (TENDON SHEALTH INCISION)  (Right)  Patient Location: PACU  Anesthesia Type: MAC, Bier Block   Level of Consciousness: awake, alert  and oriented  Airway and Oxygen Therapy: Patient Spontanous Breathing  Post-op Pain: mild  Post-op Assessment: Post-op Vital signs reviewed  Post-op Vital Signs: Reviewed  Last Vitals:  Filed Vitals:   04/20/15 1145  BP: 124/69  Pulse: 82  Temp:   Resp: 14    Complications: No apparent anesthesia complications

## 2015-04-20 NOTE — Interval H&P Note (Signed)
History and Physical Interval Note:  04/20/2015 7:32 AM  [name]  has presented today for surgery, with the diagnosis of TRIGGER THUMB RIGHT THUMB   The various methods of treatment have been discussed with the patient and family. After consideration of risks, benefits and other options for treatment, the patient has consented to  Procedure(s): RIGHT TRIGGER FINGER RELEASE (TENDON SHEALTH INCISION)  (Right) as a surgical intervention .  The patient's history has been reviewed, patient examined, no change in status, stable for surgery.  I have reviewed the patient's chart and labs.  Questions were answered to the patient's satisfaction.     [name]

## 2015-04-20 NOTE — Transfer of Care (Signed)
Immediate Anesthesia Transfer of Care Note  Patient: [name]  Procedure(s) Performed: Procedure(s): RIGHT TRIGGER FINGER RELEASE (TENDON SHEALTH INCISION)  (Right)  Patient Location: PACU  Anesthesia Type:MAC and Bier block  Level of Consciousness: awake, alert  and oriented  Airway & Oxygen Therapy: Patient Spontanous Breathing and Patient connected to face mask oxygen  Post-op Assessment: Report given to RN, Post -op Vital signs reviewed and stable and Patient moving all extremities  Post vital signs: Reviewed and stable  Last Vitals:  Filed Vitals:   04/20/15 0941  BP: 137/81  Pulse: 80  Temp: 36.8 C  Resp: 18    Complications: No apparent anesthesia complications

## 2015-04-20 NOTE — Discharge Instructions (Signed)
Do not remove bandage.  Do not get bandage wet.  May apply ice for up to 20 minutes at a time for pain and swelling.  Follow up appointment in one week.  Call your surgeon if you experience:   1.  Fever over 101.0. 2.  Inability to urinate. 3.  Nausea and/or vomiting. 4.  Extreme swelling or bruising at the surgical site. 5.  Continued bleeding from the incision. 6.  Increased pain, redness or drainage from the incision. 7.  Problems related to your pain medication. 8. Any change in color, movement and/or sensation 9. Any problems and/or concerns   Post Anesthesia Home Care Instructions  Activity: Get plenty of rest for the remainder of the day. A responsible adult should stay with you for 24 hours following the procedure.  For the next 24 hours, DO NOT: -Drive a car -Paediatric nurse -Drink alcoholic beverages -Take any medication unless instructed by your physician -Make any legal decisions or sign important papers.  Meals: Start with liquid foods such as gelatin or soup. Progress to regular foods as tolerated. Avoid greasy, spicy, heavy foods. If nausea and/or vomiting occur, drink only clear liquids until the nausea and/or vomiting subsides. Call your physician if vomiting continues.  Special Instructions/Symptoms: Your throat may feel dry or sore from the anesthesia or the breathing tube placed in your throat during surgery. If this causes discomfort, gargle with warm salt water. The discomfort should disappear within 24 hours.  If you had a scopolamine patch placed behind your ear for the management of post- operative nausea and/or vomiting:  1. The medication in the patch is effective for 72 hours, after which it should be removed.  Wrap patch in a tissue and discard in the trash. Wash hands thoroughly with soap and water. 2. You may remove the patch earlier than 72 hours if you experience unpleasant side effects which may include dry mouth, dizziness or visual  disturbances. 3. Avoid touching the patch. Wash your hands with soap and water after contact with the patch.

## 2015-04-20 NOTE — Anesthesia Procedure Notes (Signed)
Procedure Name: MAC Date/Time: 04/20/2015 11:04 AM Performed by: Baxter Flattery Pre-anesthesia Checklist: Patient identified, Emergency Drugs available, Suction available and Patient being monitored Patient Re-evaluated:Patient Re-evaluated prior to inductionOxygen Delivery Method: Simple face mask Preoxygenation: Pre-oxygenation with 100% oxygen Intubation Type: IV induction Ventilation: Mask ventilation without difficulty Dental Injury: Teeth and Oropharynx as per pre-operative assessment

## 2015-04-21 ENCOUNTER — Encounter (HOSPITAL_BASED_OUTPATIENT_CLINIC_OR_DEPARTMENT_OTHER): Payer: Self-pay | Admitting: Orthopedic Surgery

## 2015-04-21 NOTE — Op Note (Signed)
[name], [name] NO.:  [id number]  MEDICAL RECORD NO.:  [id number]  LOCATION:                               FACILITY:  [facility]  PHYSICIAN:  [name], M.D. DATE OF BIRTH:  1950-12-16  DATE OF PROCEDURE:  04/20/2015 DATE OF DISCHARGE:  04/20/2015                              OPERATIVE REPORT   PREOPERATIVE DIAGNOSIS:  Trigger finger, right thumb.  POSTOPERATIVE DIAGNOSIS:  Trigger finger, right thumb.  PROCEDURE:  A1 pulley release, right thumb.  SURGEON:  [name], M.D.  ASSISTANT:  Elmyra Ricks, P.A.  ANESTHESIA:  IV regional.  SPECIMENS:  None.  CULTURES:  None.  COMPLICATIONS:  None.  DRESSING:  Soft compressive.  DESCRIPTION OF PROCEDURE:  The patient was brought to the operating room, placed on the operating room table in supine position.  After adequate general anesthesia had been obtained, prepped and draped in usual sterile fashion.  Small transverse incision over the A1 pulley, right thumb.  Neurovascular bundles identified, protected, retracted. Completely released A1 pulley under direct visualization.  Nice complete release confirmed.  This was very thickened with underlying fluid. Wound irrigated, closed with nylon, injected with Marcaine.  Sterile compressive dressing applied.  Anesthesia reversed.  Brought to the recovery room.  Tolerated the surgery well.  No complications.     [name], M.D.     DFM/MEDQ  D:  04/20/2015  T:  04/21/2015  Job:  [id number]

## 2015-04-30 ENCOUNTER — Other Ambulatory Visit: Payer: Self-pay | Admitting: Family Medicine

## 2015-05-11 ENCOUNTER — Other Ambulatory Visit (INDEPENDENT_AMBULATORY_CARE_PROVIDER_SITE_OTHER): Payer: BC Managed Care – PPO

## 2015-05-11 DIAGNOSIS — Z0000 Encounter for general adult medical examination without abnormal findings: Secondary | ICD-10-CM | POA: Diagnosis not present

## 2015-05-11 DIAGNOSIS — Z1159 Encounter for screening for other viral diseases: Secondary | ICD-10-CM

## 2015-05-11 LAB — CBC WITH DIFFERENTIAL/PLATELET
Basophils Absolute: 0 10*3/uL (ref 0.0–0.1)
Basophils Relative: 0.4 % (ref 0.0–3.0)
Eosinophils Absolute: 0.2 10*3/uL (ref 0.0–0.7)
Eosinophils Relative: 1.7 % (ref 0.0–5.0)
HCT: 41.7 % (ref 36.0–46.0)
Hemoglobin: 13.6 g/dL (ref 12.0–15.0)
Lymphocytes Relative: 31.6 % (ref 12.0–46.0)
Lymphs Abs: 3.2 10*3/uL (ref 0.7–4.0)
MCHC: 32.6 g/dL (ref 30.0–36.0)
MCV: 87.3 fl (ref 78.0–100.0)
Monocytes Absolute: 0.6 10*3/uL (ref 0.1–1.0)
Monocytes Relative: 6 % (ref 3.0–12.0)
Neutro Abs: 6 10*3/uL (ref 1.4–7.7)
Neutrophils Relative %: 60.3 % (ref 43.0–77.0)
Platelets: 316 10*3/uL (ref 150.0–400.0)
RBC: 4.78 Mil/uL (ref 3.87–5.11)
RDW: 13.5 % (ref 11.5–15.5)
WBC: 10 10*3/uL (ref 4.0–10.5)

## 2015-05-11 LAB — BASIC METABOLIC PANEL
BUN: 15 mg/dL (ref 6–23)
CO2: 25 mEq/L (ref 19–32)
Calcium: 9.2 mg/dL (ref 8.4–10.5)
Chloride: 97 mEq/L (ref 96–112)
Creatinine, Ser: 0.65 mg/dL (ref 0.40–1.20)
GFR: 97.39 mL/min (ref >60.00)
Glucose, Bld: 135 mg/dL — ABNORMAL HIGH (ref 70–99)
Potassium: 4 mEq/L (ref 3.5–5.1)
Sodium: 135 mEq/L (ref 135–145)

## 2015-05-11 LAB — HEPATIC FUNCTION PANEL
ALT: 16 U/L (ref 0–35)
AST: 17 U/L (ref 0–37)
Albumin: 4.2 g/dL (ref 3.5–5.2)
Alkaline Phosphatase: 188 U/L — ABNORMAL HIGH (ref 39–117)
Bilirubin, Direct: 0.1 mg/dL (ref 0.0–0.3)
Total Bilirubin: 0.5 mg/dL (ref 0.2–1.2)
Total Protein: 7 g/dL (ref 6.0–8.3)

## 2015-05-11 LAB — LIPID PANEL
Cholesterol: 158 mg/dL (ref 0–200)
HDL: 64.8 mg/dL (ref >39.00)
LDL Cholesterol: 76 mg/dL (ref 0–99)
NonHDL: 93.12
Total CHOL/HDL Ratio: 2
Triglycerides: 87 mg/dL (ref 0.0–149.0)
VLDL: 17.4 mg/dL (ref 0.0–40.0)

## 2015-05-11 LAB — POCT URINALYSIS DIPSTICK
Bilirubin, UA: NEGATIVE
Blood, UA: NEGATIVE
Glucose, UA: NEGATIVE
Ketones, UA: NEGATIVE
Nitrite, UA: NEGATIVE
Protein, UA: NEGATIVE
Spec Grav, UA: 1.015
Urobilinogen, UA: 0.2
pH, UA: 6

## 2015-05-11 LAB — TSH: TSH: 3.55 u[IU]/mL (ref 0.35–4.50)

## 2015-05-11 LAB — MICROALBUMIN / CREATININE URINE RATIO
Creatinine,U: 86 mg/dL
Microalb Creat Ratio: 2 mg/g (ref 0.0–30.0)
Microalb, Ur: 1.7 mg/dL (ref 0.0–1.9)

## 2015-05-11 LAB — HEMOGLOBIN A1C: Hgb A1c MFr Bld: 7.1 % — ABNORMAL HIGH (ref 4.6–6.5)

## 2015-05-12 LAB — HEPATITIS C ANTIBODY: HCV Ab: NEGATIVE

## 2015-05-16 ENCOUNTER — Ambulatory Visit (INDEPENDENT_AMBULATORY_CARE_PROVIDER_SITE_OTHER): Payer: BC Managed Care – PPO | Admitting: Family Medicine

## 2015-05-16 ENCOUNTER — Encounter: Payer: Self-pay | Admitting: Family Medicine

## 2015-05-16 VITALS — BP 120/84 | HR 87 | Temp 99.5°F | Ht 66.0 in | Wt 235.0 lb

## 2015-05-16 DIAGNOSIS — Z0000 Encounter for general adult medical examination without abnormal findings: Secondary | ICD-10-CM | POA: Diagnosis not present

## 2015-05-16 DIAGNOSIS — E119 Type 2 diabetes mellitus without complications: Secondary | ICD-10-CM | POA: Diagnosis not present

## 2015-05-16 DIAGNOSIS — R748 Abnormal levels of other serum enzymes: Secondary | ICD-10-CM | POA: Insufficient documentation

## 2015-05-16 NOTE — Patient Instructions (Addendum)
Let's increase your metformin to 1/2 a pill twice a day  No other medicine changes  As back heals up, hopeful you can get back to your exercise  I would set a goal not to gain a lb through the holidays  Otherwise you seem to be doing well

## 2015-05-16 NOTE — Progress Notes (Addendum)
Hayley Reddish, MD Phone: [phone]  Subjective:  Patient presents today for their annual physical. Chief complaint-noted.   See problem oriented charting- ROS- full  review of systems was completed and negative except for: low back pain worsened for a few days- considering going to see Dr. [name]. Intermittent migraines controlled with imitrex.   The following were reviewed and entered/updated in epic: Past Medical History  Diagnosis Date  . Allergy   . Hyperlipidemia   . IBS (irritable bowel syndrome)   . Lumbar disc disease   . Colon polyps   . Anxiety   . Asthma     /w resp. upset   . GERD (gastroesophageal reflux disease)   . History of migraine headaches   . Arthritis     back & knee  . Gastric polyps   . Gastroparesis   . Diabetes mellitus without complication [facility])    Patient Active Problem List   Diagnosis Date Noted  . Diabetes mellitus type 2, controlled ([place]) 05/12/2014    Priority: High  . Moderate aortic stenosis 05/12/2014    Priority: Medium  . GERD (gastroesophageal reflux disease) 03/10/2014    Priority: Medium  . Generalized anxiety disorder 03/10/2014    Priority: Medium  . Migraines 03/10/2014    Priority: Medium  . Irritable bowel syndrome 12/15/2008    Priority: Medium  . OBESITY, MORBID 10/12/2008    Priority: Medium  . Hyperlipidemia 02/09/2008    Priority: Medium  . Acne 05/12/2014    Priority: Low  . Acute sinusitis 08/08/2012    Priority: Low  . EUSTACHIAN TUBE DYSFUNCTION, CHRONIC 06/14/2010    Priority: Low  . Low back pain 08/11/2008    Priority: Low  . ALLERGIC RHINITIS 02/12/2007    Priority: Low  . ASTHMA 02/12/2007    Priority: Low  . Atypical chest pain 11/10/2014   Past Surgical History  Procedure Laterality Date  . Abdominal hysterectomy    . Oophorectomy    . Carpal tunnel release Bilateral   . Esophagogastroduodenoscopy    . Rotator cuff repair Left   . Lasik    . Breast enhancement surgery  1980  .  Laparoscopic cholecystectomy    . Bladder suspension  2007  . Knee arthroscopy  04/15/2012    Procedure: ARTHROSCOPY KNEE;  Surgeon: [name], MD;  Location: [place];  Service: Orthopedics;  Laterality: Right;  . Total knee arthroplasty  04/15/2012    Procedure: TOTAL KNEE ARTHROPLASTY;  Surgeon: [name], MD;  Location: [place];  Service: Orthopedics;  Laterality: Right;  . Dorsal compartment release Right 09/15/2014    Procedure: RIGHT WRIST DEQUERVAINS RELEASE ;  Surgeon: Kathryne Hitch, MD;  Location: [place];  Service: Orthopedics;  Laterality: Right;  . Trigger finger release Right 04/20/2015    Procedure: RIGHT TRIGGER FINGER RELEASE (TENDON SHEALTH INCISION) ;  Surgeon: [name], MD;  Location: [place];  Service: Orthopedics;  Laterality: Right;    Family History  Problem Relation Age of Onset  . COPD Mother   . Heart disease Father   . Colon polyps Mother   . Colon polyps Maternal Aunt   . Irritable bowel syndrome Mother   . Alcohol abuse Father     Medications- reviewed and updated Current Outpatient Prescriptions  Medication Sig Dispense Refill  . aspirin 81 MG tablet Take 1 tablet (81 mg total) by mouth daily. 30 tablet 0  . atorvastatin (LIPITOR) 40 MG tablet TAKE 1 TABLET  BY MOUTH AT NIGHT ON TUESDAY AND FRIDAY 30 tablet 5  . azelastine (ASTELIN) 137 MCG/SPRAY nasal spray Place 1 spray into the nose 2 (two) times daily.     . Blood Glucose Monitoring Suppl (ONETOUCH VERIO) W/DEVICE KIT 1 kit by Does not apply route once. 1 kit 0  . calcium-vitamin D (OSCAL WITH D) 500-200 MG-UNIT per tablet Take 1 tablet by mouth daily.    [name] doxycycline (VIBRAMYCIN) 100 MG capsule Take 100 mg by mouth 2 (two) times daily.     . DULoxetine (CYMBALTA) 30 MG capsule TAKE 1 CAPSULE (30 MG TOTAL) BY MOUTH 3 (THREE) TIMES DAILY. 90 capsule 6  . fluticasone (FLONASE) 50 MCG/ACT nasal spray     . glucose blood (ONETOUCH VERIO) test strip Use to  test blood sugars daily. Dx: E11.9 100 each 12  . Lancets (ONETOUCH ULTRASOFT) lancets Use to test blood sugars daily. Dx: E11.9 100 each 12  . loratadine-pseudoephedrine (KLS ALLERCLEAR D-24HR) 10-240 MG per 24 hr tablet Take 1 tablet by mouth daily. 30 tablet 11  . metFORMIN (GLUCOPHAGE) 500 MG tablet Take 1 tablet (500 mg total) by mouth daily with breakfast. (Patient taking differently: Take 250 mg by mouth daily with breakfast. ) 30 tablet 5  . nabumetone (RELAFEN) 750 MG tablet TAKE 2 TABLETS BY MOUTH EVERY DAY 60 tablet 3  . omeprazole (PRILOSEC) 40 MG capsule Take 1 capsule (40 mg total) by mouth daily. 30 capsule 11  . Probiotic Product (ALIGN) 4 MG CAPS Take by mouth.    . psyllium (METAMUCIL SMOOTH TEXTURE) 28 % packet Take 1 packet by mouth 2 (two) times daily.     . metoCLOPramide (REGLAN) 10 MG tablet Take 1/2 tablet 4 times daily. (Patient not taking: Reported on 05/16/2015) 60 tablet 5  . SUMAtriptan (IMITREX) 100 MG tablet TAKE 1 TABLET BY MOUTH EVERY 2 HOURS AS NEEDED FOR MIGRAINE (Patient not taking: Reported on 05/16/2015) 9 tablet 3   Allergies-reviewed and updated Allergies  Allergen Reactions  . Erythromycin Nausea Only  . Penicillins Rash    Social History   Social History  . Marital Status: Married    Spouse Name: N/A  . Number of Children: 1  . Years of Education: N/A   Occupational History  . Retired    Social History Main Topics  . Smoking status: Never Smoker   . Smokeless tobacco: Never Used  . Alcohol Use: No  . Drug Use: No  . Sexual Activity: Yes   Other Topics Concern  . None   Social History Narrative   She lives with husband (1978) and two dogs. Step-son [name] (1971-psychiatrist in [place]). No grandkids. 2 dogs-sheltie/collie mix and border collie mix      Highest level of education:  Master in education   She is retired Animal nutritionist x 32 years.      Hobbies: time with dogs             ROS--See HPI   Objective: BP  120/84 mmHg  Pulse 87  Temp(Src) 99.5 F (37.5 C)  Ht _0  (1.676 m)  Wt 235 lb (106.595 kg)  BMI 37.95 kg/m2 Gen: NAD, resting comfortably HEENT: Mucous membranes are moist. Oropharynx normal Breast deferred to [facility], GYN exam through GYN Neck: no thyromegaly CV: 4/6 SEM, RRR, no rubs or gallops Lungs: CTAB no crackles, wheeze, rhonchi Abdomen: soft/nontender/nondistended/normal bowel sounds. No rebound or guarding. obese Ext: no edema Skin: warm, dry Neuro: grossly normal, moves all extremities,  PERRLA Diabetic Foot Exam - Simple   Simple Foot Form  Diabetic Foot exam was performed with the following findings:  Yes 05/16/2015  2:16 PM  Visual Inspection  No deformities, no ulcerations, no other skin breakdown bilaterally:  Yes  Sensation Testing  Intact to touch and monofilament testing bilaterally:  Yes  Pulse Check  Posterior Tibialis and Dorsalis pulse intact bilaterally:  Yes  Comments     Assessment/Plan:  64 y.o. female presenting for annual physical.  Health Maintenance counseling: 1. Anticipatory guidance: Patient counseled regarding regular dental exams, eye exams, wearing seatbelts.  2. Risk factor reduction:  Advised patient of need for regular exercise and diet rich and fruits and vegetables to reduce risk of heart attack and stroke.  3. Immunizations/screenings/ancillary studies Health Maintenance Due  Topic Date Due  . FOOT EXAM - today 11/25/1960  . HIV Screening - declined 11/25/1965  4. Cervical cancer screening- 09/10/12 sees Freda [facility]ro GYN 5. Breast cancer screening-  breast exam through [facility] and mammogram 09/01/14 6. Colon cancer screening - 07/28/14 with 10 year follow up  7. Skin cancer screening- Dr. Elvera Lennox at least yearly  6 months  Diabetes mellitus type 2, controlled ([place]) Goal a1c <7. Push metformin from once daily to Metformin 246m BID

## 2015-05-16 NOTE — Assessment & Plan Note (Signed)
Goal a1c <7. Push metformin from once daily to Metformin 250mg  BID

## 2015-05-20 ENCOUNTER — Other Ambulatory Visit: Payer: Self-pay | Admitting: Family Medicine

## 2015-06-13 ENCOUNTER — Encounter: Payer: Self-pay | Admitting: Family Medicine

## 2015-06-25 HISTORY — PX: PALATE TO GINGIVA GRAFT: SHX2152

## 2015-08-06 ENCOUNTER — Other Ambulatory Visit: Payer: Self-pay | Admitting: Family Medicine

## 2015-08-09 ENCOUNTER — Encounter: Payer: Self-pay | Admitting: Internal Medicine

## 2015-08-16 ENCOUNTER — Encounter: Payer: Self-pay | Admitting: Family Medicine

## 2015-09-03 ENCOUNTER — Other Ambulatory Visit: Payer: Self-pay | Admitting: Family Medicine

## 2015-10-11 LAB — HM MAMMOGRAPHY

## 2015-10-16 ENCOUNTER — Encounter: Payer: Self-pay | Admitting: Family Medicine

## 2015-10-17 ENCOUNTER — Other Ambulatory Visit: Payer: Self-pay | Admitting: Family Medicine

## 2015-10-23 ENCOUNTER — Ambulatory Visit (INDEPENDENT_AMBULATORY_CARE_PROVIDER_SITE_OTHER): Payer: BC Managed Care – PPO | Admitting: Pulmonary Disease

## 2015-10-23 ENCOUNTER — Encounter: Payer: Self-pay | Admitting: Pulmonary Disease

## 2015-10-23 VITALS — BP 128/68 | HR 86 | Ht 66.0 in | Wt 233.0 lb

## 2015-10-23 DIAGNOSIS — F458 Other somatoform disorders: Secondary | ICD-10-CM | POA: Diagnosis not present

## 2015-10-23 DIAGNOSIS — E669 Obesity, unspecified: Secondary | ICD-10-CM | POA: Diagnosis not present

## 2015-10-23 DIAGNOSIS — Z6832 Body mass index (BMI) 32.0-32.9, adult: Secondary | ICD-10-CM | POA: Insufficient documentation

## 2015-10-23 DIAGNOSIS — J309 Allergic rhinitis, unspecified: Secondary | ICD-10-CM | POA: Diagnosis not present

## 2015-10-23 DIAGNOSIS — G4710 Hypersomnia, unspecified: Secondary | ICD-10-CM

## 2015-10-23 NOTE — Assessment & Plan Note (Signed)
Pt has snoring. Denies gasping, choking. Denies witnessed apneas.  (+) sleep talking. Has legs moving with occasional dreaming. (-) other abn behavior in sleep.  Noted to have bruxism by dentist.   Has unrefreshed sleep.  Mild hypersomnia.   Plan : 1. Patient will be Medicare starting next month. She does not want to have an in lab study. Plan for home sleep study in June. 2. If she has mild sleep apnea, plan to hold off on treating as she anticipates she wanted comfortable using his CPAP. 3. If she has moderate to severe sleep apnea, plan to try or not her CPAP. May need to try her in lower pressure to facilitate her being used to it. Patient has moderate aortic stenosis. 4. Has recurrent vivid dreams as well as sleep talking and frequent moving of legs. Denies other abnormal behavior and sleep. 5. May need desensitization.

## 2015-10-23 NOTE — Assessment & Plan Note (Signed)
Weight reduction 

## 2015-10-23 NOTE — Patient Instructions (Addendum)
1. We will  schedule you a home sleep study for June. 2. If that shows significant sleep apnea, will order you a cpap machine. 3. Call the office if you have issues with cpap.   Return to clinic in mid August 2017.

## 2015-10-23 NOTE — Assessment & Plan Note (Signed)
With bruxism. Dentist mentioned about cpap. Need HST. Will try cpap. May need mouth guard.

## 2015-10-23 NOTE — Assessment & Plan Note (Signed)
Sees Allergist (03/30/14 last visit) Claritin D 24. Azelastine and nasonex and saline. Allergy injections q 2 weeks.

## 2015-10-23 NOTE — Progress Notes (Signed)
Subjective:    Patient ID: [name], female    DOB: 13-Aug-1950, 65 y.o.   MRN: [id number]  HPI  This is the case of [name], 65 y.o. Female, who was referred by Dr. Garret Reddish in consultation regarding possible OSA.   As you very well know, patient is a non smoker.  Pt denies any current history of asthma or copd.  Gets allergy shots every 2 weeks.   Pt has snoring. Denies gasping, choking. Denies witnessed apneas.  (+) sleep talking. Has legs moving with occasional dreaming. (-) other abn behavior in sleep.  Noted to have bruxism by dentist.   Has unrefreshed sleep.  Mild hypersomnia.      Review of Systems  Constitutional: Negative.  Negative for fever and unexpected weight change.  HENT: Positive for congestion and postnasal drip. Negative for dental problem, ear pain, nosebleeds, rhinorrhea, sneezing, sore throat and trouble swallowing.   Eyes: Positive for redness and itching.  Respiratory: Positive for cough. Negative for chest tightness, shortness of breath and wheezing.   Cardiovascular: Negative.  Negative for palpitations and leg swelling.  Gastrointestinal: Negative.  Negative for nausea and vomiting.  Endocrine: Negative.   Genitourinary: Negative.  Negative for dysuria.  Musculoskeletal: Positive for back pain and arthralgias. Negative for joint swelling.  Skin: Negative.  Negative for rash.  Allergic/Immunologic: Positive for environmental allergies.  Neurological: Positive for headaches.  Hematological: Negative.  Does not bruise/bleed easily.  Psychiatric/Behavioral: Negative.  Negative for dysphoric mood. The patient is not nervous/anxious.    Past Medical History  Diagnosis Date  . Allergy   . Hyperlipidemia   . IBS (irritable bowel syndrome)   . Lumbar disc disease   . Colon polyps   . Anxiety   . Asthma     /w resp. upset   . GERD (gastroesophageal reflux disease)   . History of migraine headaches   . Arthritis    back & knee  . Gastric polyps   . Gastroparesis   . Diabetes mellitus without complication (HCC)    (-) CA, DVT  Family History  Problem Relation Age of Onset  . COPD Mother   . Heart disease Father   . Colon polyps Mother   . Colon polyps Maternal Aunt   . Irritable bowel syndrome Mother   . Alcohol abuse Father      Past Surgical History  Procedure Laterality Date  . Abdominal hysterectomy    . Oophorectomy    . Carpal tunnel release Bilateral   . Esophagogastroduodenoscopy    . Rotator cuff repair Left   . Lasik    . Breast enhancement surgery  1980  . Laparoscopic cholecystectomy    . Bladder suspension  2007  . Knee arthroscopy  04/15/2012    Procedure: ARTHROSCOPY KNEE;  Surgeon: [name], MD;  Location: [facility];  Service: Orthopedics;  Laterality: Right;  . Total knee arthroplasty  04/15/2012    Procedure: TOTAL KNEE ARTHROPLASTY;  Surgeon: [name], MD;  Location: [facility];  Service: Orthopedics;  Laterality: Right;  . Dorsal compartment release Right 09/15/2014    Procedure: RIGHT WRIST DEQUERVAINS RELEASE ;  Surgeon: Kathryne Hitch, MD;  Location: [facility];  Service: Orthopedics;  Laterality: Right;  . Trigger finger release Right 04/20/2015    Procedure: RIGHT TRIGGER FINGER RELEASE (TENDON SHEALTH INCISION) ;  Surgeon: [name], MD;  Location: [facility];  Service: Orthopedics;  Laterality: Right;  Social History   Social History  . Marital Status: Married    Spouse Name: N/A  . Number of Children: 1  . Years of Education: N/A   Occupational History  . Retired    Social History Main Topics  . Smoking status: Never Smoker   . Smokeless tobacco: Never Used  . Alcohol Use: No  . Drug Use: No  . Sexual Activity: Yes   Other Topics Concern  . Not on file   Social History Narrative   She lives with husband (1978) and two dogs. Step-son [name] (1971-psychiatrist in [place]). No grandkids. 2  dogs-sheltie/collie mix and border collie mix      Highest level of education:  Master in education   She is retired Animal nutritionist x 32 years.      Hobbies: time with dogs              Allergies  Allergen Reactions  . Erythromycin Nausea Only  . Penicillins Rash     Outpatient Prescriptions Prior to Visit  Medication Sig Dispense Refill  . aspirin 81 MG tablet Take 1 tablet (81 mg total) by mouth daily. 30 tablet 0  . atorvastatin (LIPITOR) 40 MG tablet TAKE 1 TABLET BY MOUTH AT NIGHT ON TUESDAY AND FRIDAY 30 tablet 5  . azelastine (ASTELIN) 137 MCG/SPRAY nasal spray Place 1 spray into the nose 2 (two) times daily.     . Blood Glucose Monitoring Suppl (ONETOUCH VERIO) W/DEVICE KIT 1 kit by Does not apply route once. 1 kit 0  . calcium-vitamin D (OSCAL WITH D) 500-200 MG-UNIT per tablet Take 1 tablet by mouth daily.    [name] doxycycline (VIBRAMYCIN) 100 MG capsule Take 100 mg by mouth 2 (two) times daily.     . DULoxetine (CYMBALTA) 30 MG capsule TAKE ONE CAPSULE BY MOUTH 3 TIMES A DAY 90 capsule 3  . fluticasone (FLONASE) 50 MCG/ACT nasal spray     . glucose blood (ONETOUCH VERIO) test strip Use to test blood sugars daily. Dx: E11.9 100 each 12  . Lancets (ONETOUCH ULTRASOFT) lancets Use to test blood sugars daily. Dx: E11.9 100 each 12  . loratadine-pseudoephedrine (KLS ALLERCLEAR D-24HR) 10-240 MG per 24 hr tablet Take 1 tablet by mouth daily. 30 tablet 11  . metFORMIN (GLUCOPHAGE) 500 MG tablet TAKE 1 TABLET BY MOUTH DAILY WITH BREAKFAST 30 tablet 5  . metoCLOPramide (REGLAN) 10 MG tablet TAKE 1/2 TABLET 4 TIMES DAILY. 60 tablet 5  . nabumetone (RELAFEN) 750 MG tablet TAKE 2 TABLETS BY MOUTH EVERY DAY 60 tablet 3  . omeprazole (PRILOSEC) 40 MG capsule Take 1 capsule (40 mg total) by mouth daily. 30 capsule 11  . Probiotic Product (ALIGN) 4 MG CAPS Take by mouth.    . psyllium (METAMUCIL SMOOTH TEXTURE) 28 % packet Take 1 packet by mouth 2 (two) times daily.     . SUMAtriptan  (IMITREX) 100 MG tablet TAKE 1 TABLET BY MOUTH EVERY 2 HOURS AS NEEDED FOR MIGRAINE 9 tablet 3   No facility-administered medications prior to visit.   No orders of the defined types were placed in this encounter.          Objective:   Physical Exam   Vitals:  Filed Vitals:   10/23/15 1540  BP: 128/68  Pulse: 86  Height: 5' 6"  (1.676 m)  Weight: 233 lb (105.688 kg)  SpO2: 95%    Constitutional/General:  Pleasant, well-nourished, well-developed, not in any distress,  Comfortably seating.  Well  kempt  Body mass index is 37.63 kg/(m^2). Wt Readings from Last 3 Encounters:  10/23/15 233 lb (105.688 kg)  05/16/15 235 lb (106.595 kg)  04/20/15 232 lb (105.235 kg)     HEENT: Pupils equal and reactive to light and accommodation. Anicteric sclerae. Normal nasal mucosa.   No oral  lesions,  mouth clear,  oropharynx clear, no postnasal drip. (-) Oral thrush. No dental caries.  Airway - Mallampati class III  Neck: No masses. Midline trachea. No JVD, (-) LAD. (-) bruits appreciated.  Respiratory/Chest: Grossly normal chest. (-) deformity. (-) Accessory muscle use.  Symmetric expansion. (-) Tenderness on palpation.  Resonant on percussion.  Diminished BS on both lower lung zones. (-) wheezing, crackles, rhonchi (-) egophony  Cardiovascular: Regular rate and  rhythm, heart sounds normal, no murmur or gallops, no peripheral edema  Gastrointestinal:  Normal bowel sounds. Soft, non-tender. No hepatosplenomegaly.  (-) masses.   Musculoskeletal:  Normal muscle tone. Normal gait.   Extremities: Grossly normal. (-) clubbing, cyanosis.  (-) edema  Skin: (-) rash,lesions seen.   Neurological/Psychiatric : alert, oriented to time, place, person. Normal mood and affect           Assessment & Plan:  Allergic rhinitis Sees Allergist (03/30/14 last visit) Claritin D 24. Azelastine and nasonex and saline. Allergy injections q 2 weeks.    Obesity Weight reduction.    Hypersomnia Pt has snoring. Denies gasping, choking. Denies witnessed apneas.  (+) sleep talking. Has legs moving with occasional dreaming. (-) other abn behavior in sleep.  Noted to have bruxism by dentist.   Has unrefreshed sleep.  Mild hypersomnia.   Plan : 1. Patient will be Medicare starting next month. She does not want to have an in lab study. Plan for home sleep study in June. 2. If she has mild sleep apnea, plan to hold off on treating as she anticipates she wanted comfortable using his CPAP. 3. If she has moderate to severe sleep apnea, plan to try or not her CPAP. May need to try her in lower pressure to facilitate her being used to it. Patient has moderate aortic stenosis. 4. Has recurrent vivid dreams as well as sleep talking and frequent moving of legs. Denies other abnormal behavior and sleep. 5. May need desensitization.   Bruxism With bruxism. Dentist mentioned about cpap. Need HST. Will try cpap. May need mouth guard.    Thank you very much for letting me participate in this patient's care. Please do not hesitate to give me a call if you have any questions or concerns regarding the treatment plan.   Patient will follow up with me mid [place], MD 10/23/2015   4:19 PM [facility] [place] Pager: [phone] Office: [phone], Fax: [phone]

## 2015-11-05 ENCOUNTER — Other Ambulatory Visit: Payer: Self-pay | Admitting: Family Medicine

## 2015-11-14 ENCOUNTER — Encounter: Payer: Self-pay | Admitting: Family Medicine

## 2015-11-14 ENCOUNTER — Ambulatory Visit (INDEPENDENT_AMBULATORY_CARE_PROVIDER_SITE_OTHER): Payer: BC Managed Care – PPO | Admitting: Family Medicine

## 2015-11-14 ENCOUNTER — Other Ambulatory Visit: Payer: Self-pay | Admitting: Family Medicine

## 2015-11-14 VITALS — BP 136/88 | HR 92 | Temp 98.9°F | Ht 66.0 in | Wt 232.0 lb

## 2015-11-14 DIAGNOSIS — E785 Hyperlipidemia, unspecified: Secondary | ICD-10-CM | POA: Diagnosis not present

## 2015-11-14 DIAGNOSIS — K219 Gastro-esophageal reflux disease without esophagitis: Secondary | ICD-10-CM

## 2015-11-14 DIAGNOSIS — E119 Type 2 diabetes mellitus without complications: Secondary | ICD-10-CM | POA: Diagnosis not present

## 2015-11-14 LAB — HEMOGLOBIN A1C: Hgb A1c MFr Bld: 7.2 % — ABNORMAL HIGH (ref 4.6–6.5)

## 2015-11-14 LAB — COMPREHENSIVE METABOLIC PANEL
ALT: 18 U/L (ref 0–35)
AST: 21 U/L (ref 0–37)
Albumin: 4.6 g/dL (ref 3.5–5.2)
Alkaline Phosphatase: 150 U/L — ABNORMAL HIGH (ref 39–117)
BUN: 12 mg/dL (ref 6–23)
CO2: 28 mEq/L (ref 19–32)
Calcium: 9.6 mg/dL (ref 8.4–10.5)
Chloride: 95 mEq/L — ABNORMAL LOW (ref 96–112)
Creatinine, Ser: 0.58 mg/dL (ref 0.40–1.20)
GFR: 110.9 mL/min (ref >60.00)
Glucose, Bld: 91 mg/dL (ref 70–99)
Potassium: 3.8 mEq/L (ref 3.5–5.1)
Sodium: 130 mEq/L — ABNORMAL LOW (ref 135–145)
Total Bilirubin: 0.3 mg/dL (ref 0.2–1.2)
Total Protein: 7.1 g/dL (ref 6.0–8.3)

## 2015-11-14 MED ORDER — METFORMIN HCL 500 MG PO TABS: ORAL_TABLET | ORAL | Status: DC

## 2015-11-14 NOTE — Assessment & Plan Note (Signed)
S: compliant and well controlled on atorvastatin 40mg . No myalgias.  Lab Results  Component Value Date   CHOL 158 05/11/2015   HDL 64.80 05/11/2015   LDLCALC 76 05/11/2015   LDLDIRECT 84.0 11/09/2014   TRIG 87.0 05/11/2015   CHOLHDL 2 05/11/2015   A/P: continue medication as listed

## 2015-11-14 NOTE — Assessment & Plan Note (Signed)
S:prilosec 40mg . Certain foods still bother her. Hx stricture A/P: not able to titrate down due to breakthrough symptoms

## 2015-11-14 NOTE — Assessment & Plan Note (Signed)
S: suspect well controlled. On metformin 500mg  daily. Had been taking 250mg  twice a day, I thought patient was only taking once a day.   CBGs- does not check Exercise and diet- has not been exercising- due to prior cough now improved.   Lab Results  Component Value Date   HGBA1C 7.1* 05/11/2015   HGBA1C 7.1* 03/23/2015   HGBA1C 6.9* 08/18/2014   A/P: update a1c- titrate up if needed on metformin. Has had GI issues so may just try 500 am, 250 in PM if needed

## 2015-11-14 NOTE — Patient Instructions (Signed)
Labs before you go  Sign release of information at the check out desk for pap smear from GYN  No changes in medications unless indicated by labs.   Wt Readings from Last 3 Encounters:  11/14/15 232 lb (105.235 kg)  10/23/15 233 lb (105.688 kg)  05/16/15 235 lb (106.595 kg)  Down 3 lbs over 6 months. Need to continue these efforts for long term health. Would really love for you to pick up the exercise and continue to work on eating healthy with weigh tgoal of 222 by follow up

## 2015-11-14 NOTE — Progress Notes (Signed)
Subjective:  [name] is a 65 y.o. year old very pleasant female patient who presents for/with See problem oriented charting ROS- No chest pain or shortness of breath. No headache or blurry vision. Is warm natured. .see any ROS included in HPI as well.   Past Medical History-  Patient Active Problem List   Diagnosis Date Noted  . Diabetes mellitus type 2, controlled ([place]) 05/12/2014    Priority: High  . Moderate aortic stenosis 05/12/2014    Priority: Medium  . GERD (gastroesophageal reflux disease) 03/10/2014    Priority: Medium  . Generalized anxiety disorder 03/10/2014    Priority: Medium  . Migraines 03/10/2014    Priority: Medium  . Irritable bowel syndrome 12/15/2008    Priority: Medium  . OBESITY, MORBID 10/12/2008    Priority: Medium  . Hyperlipidemia 02/09/2008    Priority: Medium  . Elevated alkaline phosphatase level 05/16/2015    Priority: Low  . Acne 05/12/2014    Priority: Low  . Acute sinusitis 08/08/2012    Priority: Low  . EUSTACHIAN TUBE DYSFUNCTION, CHRONIC 06/14/2010    Priority: Low  . Low back pain 08/11/2008    Priority: Low  . Allergic rhinitis 02/12/2007    Priority: Low  . ASTHMA 02/12/2007    Priority: Low  . Hypersomnia 10/23/2015  . Obesity 10/23/2015  . Bruxism 10/23/2015  . Atypical chest pain 11/10/2014    Medications- reviewed and updated Current Outpatient Prescriptions  Medication Sig Dispense Refill  . aspirin 81 MG tablet Take 1 tablet (81 mg total) by mouth daily. 30 tablet 0  . atorvastatin (LIPITOR) 40 MG tablet TAKE 1 TABLET BY MOUTH AT NIGHT ON TUESDAY AND FRIDAY 30 tablet 5  . azelastine (ASTELIN) 137 MCG/SPRAY nasal spray Place 1 spray into the nose 2 (two) times daily.     . Blood Glucose Monitoring Suppl (ONETOUCH VERIO) W/DEVICE KIT 1 kit by Does not apply route once. 1 kit 0  . calcium-vitamin D (OSCAL WITH D) 500-200 MG-UNIT per tablet Take 1 tablet by mouth daily.    . DULoxetine (CYMBALTA) 30 MG capsule  TAKE ONE CAPSULE BY MOUTH 3 TIMES A DAY 90 capsule 3  . fluticasone (FLONASE) 50 MCG/ACT nasal spray     . glucose blood (ONETOUCH VERIO) test strip Use to test blood sugars daily. Dx: E11.9 100 each 12  . ibuprofen (ADVIL,MOTRIN) 800 MG tablet Take 1 tablet by mouth every 8 (eight) hours.    . Lancets (ONETOUCH ULTRASOFT) lancets Use to test blood sugars daily. Dx: E11.9 100 each 12  . loratadine-pseudoephedrine (KLS ALLERCLEAR D-24HR) 10-240 MG per 24 hr tablet Take 1 tablet by mouth daily. 30 tablet 11  . metFORMIN (GLUCOPHAGE) 500 MG tablet TAKE 1 TABLET BY MOUTH DAILY WITH BREAKFAST 30 tablet 5  . metoCLOPramide (REGLAN) 10 MG tablet TAKE 1/2 TABLET 4 TIMES DAILY. 60 tablet 5  . omeprazole (PRILOSEC) 40 MG capsule Take 1 capsule (40 mg total) by mouth daily. 30 capsule 11  . Probiotic Product (ALIGN) 4 MG CAPS Take by mouth.    . psyllium (METAMUCIL SMOOTH TEXTURE) 28 % packet Take 1 packet by mouth 2 (two) times daily.     . SUMAtriptan (IMITREX) 100 MG tablet TAKE 1 TABLET BY MOUTH EVERY 2 HOURS AS NEEDED FOR MIGRAINE 9 tablet 3  . doxycycline (VIBRAMYCIN) 100 MG capsule Take 100 mg by mouth 2 (two) times daily. Reported on 11/14/2015    . nabumetone (RELAFEN) 750 MG tablet TAKE 2 TABLETS  BY MOUTH EVERY DAY (Patient not taking: Reported on 11/14/2015) 60 tablet 5   No current facility-administered medications for this visit.    Objective: BP 136/88 mmHg  Pulse 92  Temp(Src) 98.9 F (37.2 C) (Oral)  Ht 5' 6"  (1.676 m)  Wt 232 lb (105.235 kg)  BMI 37.46 kg/m2  SpO2 97% Gen: NAD, resting comfortably Stitches in mouth from recent oral surgery CV: RRR no murmurs rubs or gallops Lungs: CTAB no crackles, wheeze, rhonchi Abdomen: soft/nontender/nondistended/normal bowel sounds. obese Ext: no edema Skin: warm, dry Neuro: grossly normal, moves all extremities  Assessment/Plan:  GERD (gastroesophageal reflux disease) S:prilosec 31m. Certain foods still bother her. Hx stricture A/P:  not able to titrate down due to breakthrough symptoms   Diabetes mellitus type 2, controlled ([place] S: suspect well controlled. On metformin 5084mdaily. Had been taking 25069mwice a day, I thought patient was only taking once a day.   CBGs- does not check Exercise and diet- has not been exercising- due to prior cough now improved.   Lab Results  Component Value Date   HGBA1C 7.1* 05/11/2015   HGBA1C 7.1* 03/23/2015   HGBA1C 6.9* 08/18/2014   A/P: update a1c- titrate up if needed on metformin. Has had GI issues so may just try 500 am, 250 in PM if needed  Hyperlipidemia S: compliant and well controlled on atorvastatin 55m47mo myalgias.  Lab Results  Component Value Date   CHOL 158 05/11/2015   HDL 64.80 05/11/2015   LDLCALC 76 05/11/2015   LDLDIRECT 84.0 11/09/2014   TRIG 87.0 05/11/2015   CHOLHDL 2 05/11/2015   A/P: continue medication as listed  Also discussed need for healthy lifestyle choices- weight loss 10 lb by follow up advised Also ROI for pap advised Return in about 6 months (around 05/16/2016) for follow up- or sooner if needed. verbal Return precautions advised.   Orders Placed This Encounter  Procedures  . Comprehensive metabolic panel    [place]  . Hemoglobin A1c    [place]   StepGarret Reddish

## 2015-11-28 ENCOUNTER — Encounter: Payer: Self-pay | Admitting: Family Medicine

## 2015-11-30 ENCOUNTER — Other Ambulatory Visit: Payer: Self-pay | Admitting: Family Medicine

## 2015-12-04 ENCOUNTER — Encounter: Payer: Self-pay | Admitting: Family Medicine

## 2015-12-11 ENCOUNTER — Encounter: Payer: Self-pay | Admitting: Family Medicine

## 2015-12-15 ENCOUNTER — Encounter: Payer: Self-pay | Admitting: Family Medicine

## 2015-12-18 DIAGNOSIS — G4733 Obstructive sleep apnea (adult) (pediatric): Secondary | ICD-10-CM | POA: Diagnosis not present

## 2015-12-19 MED ORDER — VENLAFAXINE HCL ER 150 MG PO CP24: 150.0000 mg | ORAL_CAPSULE | Freq: Every day | ORAL | Status: DC

## 2015-12-20 ENCOUNTER — Encounter: Payer: Self-pay | Admitting: Family Medicine

## 2015-12-28 ENCOUNTER — Telehealth: Payer: Self-pay | Admitting: Pulmonary Disease

## 2015-12-28 DIAGNOSIS — G4733 Obstructive sleep apnea (adult) (pediatric): Secondary | ICD-10-CM

## 2015-12-28 NOTE — Telephone Encounter (Signed)
  Please call the pt and tell the pt the [place]  showed OSA.  Pt stops breathing  19  times an hour.   Please order autoCPAP 5-10 cm H2O. Patient will need a mask fitting session. Patient will need a 1 month download.   Patient needs to be seen by me or any of the NPs/APPs  4-6 weeks after obtaining the cpap machine. Let me know if you receive this.   Thanks!   J. Shirl Harris, MD 12/28/2015, 8:39 PM

## 2015-12-29 ENCOUNTER — Other Ambulatory Visit: Payer: Self-pay | Admitting: *Deleted

## 2015-12-29 DIAGNOSIS — G4710 Hypersomnia, unspecified: Secondary | ICD-10-CM

## 2015-12-29 DIAGNOSIS — G4733 Obstructive sleep apnea (adult) (pediatric): Secondary | ICD-10-CM | POA: Diagnosis not present

## 2015-12-29 NOTE — Telephone Encounter (Signed)
LMTCB

## 2016-01-01 ENCOUNTER — Encounter: Payer: Self-pay | Admitting: Family Medicine

## 2016-01-01 NOTE — Telephone Encounter (Signed)
Pt called back and she is aware of order placed for cpap machine to [facility].

## 2016-01-15 ENCOUNTER — Ambulatory Visit: Payer: Medicare Other | Admitting: Family Medicine

## 2016-01-16 ENCOUNTER — Ambulatory Visit (INDEPENDENT_AMBULATORY_CARE_PROVIDER_SITE_OTHER): Payer: Medicare Other | Admitting: Family Medicine

## 2016-01-16 ENCOUNTER — Encounter: Payer: Self-pay | Admitting: Family Medicine

## 2016-01-16 DIAGNOSIS — E785 Hyperlipidemia, unspecified: Secondary | ICD-10-CM

## 2016-01-16 DIAGNOSIS — E119 Type 2 diabetes mellitus without complications: Secondary | ICD-10-CM

## 2016-01-16 NOTE — Progress Notes (Signed)
Pre visit review using our clinic review tool, if applicable. No additional management support is needed unless otherwise documented below in the visit note. 

## 2016-01-16 NOTE — Assessment & Plan Note (Signed)
S: suspect reasonably controlled. On metformin 500mg  BID. Previously patient on 250mg  BID and  We tried to titrate up.  Exercise and diet- down 4 lbs. Some increase in exercise but needs more Lab Results  Component Value Date   HGBA1C 7.2 (H) 11/14/2015   HGBA1C 7.1 (H) 05/11/2015   HGBA1C 7.1 (H) 03/23/2015   A/P: update a1c at CPE. Continue weight loss efforts. Thrilled she has been able to tolerate the 500mg  BID dosing as prior GI effects

## 2016-01-16 NOTE — Progress Notes (Signed)
Subjective:  [name] is a 65 y.o. year old very pleasant female patient who presents for/with See problem oriented charting ROS- No chest pain or shortness of breath. No  blurry vision. No fever/chills. .see any ROS included in HPI as well.   Past Medical History-  Patient Active Problem List   Diagnosis Date Noted  . Diabetes mellitus type 2, controlled ([place]) 05/12/2014    Priority: High  . Moderate aortic stenosis 05/12/2014    Priority: Medium  . GERD (gastroesophageal reflux disease) 03/10/2014    Priority: Medium  . Generalized anxiety disorder 03/10/2014    Priority: Medium  . Migraines 03/10/2014    Priority: Medium  . Irritable bowel syndrome 12/15/2008    Priority: Medium  . OBESITY, MORBID 10/12/2008    Priority: Medium  . Hyperlipidemia 02/09/2008    Priority: Medium  . Elevated alkaline phosphatase level 05/16/2015    Priority: Low  . Acne 05/12/2014    Priority: Low  . Acute sinusitis 08/08/2012    Priority: Low  . EUSTACHIAN TUBE DYSFUNCTION, CHRONIC 06/14/2010    Priority: Low  . Low back pain 08/11/2008    Priority: Low  . Allergic rhinitis 02/12/2007    Priority: Low  . ASTHMA 02/12/2007    Priority: Low  . Hypersomnia 10/23/2015  . Bruxism 10/23/2015  . Atypical chest pain 11/10/2014    Medications- reviewed and updated Current Outpatient Prescriptions  Medication Sig Dispense Refill  . aspirin 81 MG tablet Take 1 tablet (81 mg total) by mouth daily. 30 tablet 0  . atorvastatin (LIPITOR) 40 MG tablet TAKE 1 TABLET BY MOUTH AT NIGHT ON TUESDAY AND FRIDAY 30 tablet 5  . azelastine (ASTELIN) 137 MCG/SPRAY nasal spray Place 1 spray into the nose 2 (two) times daily.     . Blood Glucose Monitoring Suppl (ONETOUCH VERIO) W/DEVICE KIT 1 kit by Does not apply route once. 1 kit 0  . calcium-vitamin D (OSCAL WITH D) 500-200 MG-UNIT per tablet Take 1 tablet by mouth daily.    [name] doxycycline (VIBRAMYCIN) 100 MG capsule Take 100 mg by mouth 2 (two)  times daily. Reported on 11/14/2015    . fluticasone (FLONASE) 50 MCG/ACT nasal spray     . glucose blood (ONETOUCH VERIO) test strip Use to test blood sugars daily. Dx: E11.9 100 each 12  . KLS ALLERCLEAR D-24HR 10-240 MG 24 hr tablet TAKE 1 TABLET BY MOUTH DAILY. 30 tablet 11  . Lancets (ONETOUCH ULTRASOFT) lancets Use to test blood sugars daily. Dx: E11.9 100 each 12  . metFORMIN (GLUCOPHAGE) 500 MG tablet Take 1 tablet with breakfast and 1/2 to 1 tablet with dinner. 60 tablet 5  . metoCLOPramide (REGLAN) 10 MG tablet TAKE 1/2 TABLET 4 TIMES DAILY. 60 tablet 5  . nabumetone (RELAFEN) 750 MG tablet TAKE 2 TABLETS BY MOUTH EVERY DAY 60 tablet 5  . omeprazole (PRILOSEC) 40 MG capsule Take 1 capsule (40 mg total) by mouth daily. 30 capsule 11  . Probiotic Product (ALIGN) 4 MG CAPS Take by mouth.    . psyllium (METAMUCIL SMOOTH TEXTURE) 28 % packet Take 1 packet by mouth 2 (two) times daily.     . SUMAtriptan (IMITREX) 100 MG tablet TAKE 1 TABLET BY MOUTH EVERY 2 HOURS AS NEEDED FOR MIGRAINE 9 tablet 3  . venlafaxine XR (EFFEXOR-XR) 150 MG 24 hr capsule Take 1 capsule (150 mg total) by mouth daily with breakfast. 30 capsule 5   No current facility-administered medications for this visit.  Objective: BP 122/82   Pulse 80   Temp 98.4 F (36.9 C) (Oral)   Wt 228 lb 9.6 oz (103.7 kg)   SpO2 95%   BMI 36.90 kg/m  Gen: NAD, resting comfortably CV: RRR 4/6 SEM no rubs or gallops Lungs: CTAB no crackles, wheeze, rhonchi Abdomen: soft/nontender/nondistended/normal bowel sounds. No rebound or guarding. obese Ext: no edema Skin: warm, dry Neuro: grossly normal, moves all extremities  Assessment/Plan:  Initial BP elevated- controlled on repeat  Diabetes mellitus type 2, controlled ([name]) S: suspect reasonably controlled. On metformin 558m BID. Previously patient on 2557mBID and  We tried to titrate up.  Exercise and diet- down 4 lbs. Some increase in exercise but needs more Lab Results   Component Value Date   HGBA1C 7.2 (H) 11/14/2015   HGBA1C 7.1 (H) 05/11/2015   HGBA1C 7.1 (H) 03/23/2015   A/P: update a1c at CPE. Continue weight loss efforts. Thrilled she has been able to tolerate the 50068mID dosing as prior GI effects  Hyperlipidemia S: well controlled on atorvastatin 77m43mo myalgias.  Lab Results  Component Value Date   CHOL 158 05/11/2015   HDL 64.80 05/11/2015   LDLCALC 76 05/11/2015   LDLDIRECT 84.0 11/09/2014   TRIG 87.0 05/11/2015   CHOLHDL 2 05/11/2015   A/P: continue current therapy, doing well. Keep working on weight loss  November CPE  Return precautions advised.  StepGarret Reddish

## 2016-01-16 NOTE — Patient Instructions (Signed)
No changes today  Physical in November- will get labs then  [place] job 4 lbs down- continue the good work. Would love to see another 4 lbs off. Your plan was to boost the exercise.

## 2016-01-16 NOTE — Assessment & Plan Note (Signed)
S: well controlled on atorvastatin 40mg . No myalgias.  Lab Results  Component Value Date   CHOL 158 05/11/2015   HDL 64.80 05/11/2015   LDLCALC 76 05/11/2015   LDLDIRECT 84.0 11/09/2014   TRIG 87.0 05/11/2015   CHOLHDL 2 05/11/2015   A/P: continue current therapy, doing well. Keep working on weight loss

## 2016-01-25 ENCOUNTER — Ambulatory Visit: Payer: BC Managed Care – PPO | Admitting: Pulmonary Disease

## 2016-02-21 ENCOUNTER — Encounter: Payer: Self-pay | Admitting: Family Medicine

## 2016-02-22 ENCOUNTER — Other Ambulatory Visit: Payer: Self-pay | Admitting: Family Medicine

## 2016-02-27 ENCOUNTER — Ambulatory Visit (INDEPENDENT_AMBULATORY_CARE_PROVIDER_SITE_OTHER): Payer: Medicare Other | Admitting: Pulmonary Disease

## 2016-02-27 DIAGNOSIS — E669 Obesity, unspecified: Secondary | ICD-10-CM | POA: Diagnosis not present

## 2016-02-27 DIAGNOSIS — G4733 Obstructive sleep apnea (adult) (pediatric): Secondary | ICD-10-CM | POA: Diagnosis not present

## 2016-02-27 DIAGNOSIS — F458 Other somatoform disorders: Secondary | ICD-10-CM | POA: Diagnosis not present

## 2016-02-27 DIAGNOSIS — J302 Other seasonal allergic rhinitis: Secondary | ICD-10-CM

## 2016-02-27 DIAGNOSIS — Z23 Encounter for immunization: Secondary | ICD-10-CM | POA: Diagnosis not present

## 2016-02-27 NOTE — Progress Notes (Signed)
Subjective:    Patient ID: [name], female    DOB: 1951-04-11, 65 y.o.   MRN: [id number]  HPI  Pt is  [name], 65 y.o. Female, who was referred by Dr. Garret Reddish in consultation regarding possible OSA.   DATA HST (12/28/15)  AHI 19. autocpap 5-10 cm water   ROV 02/27/16 Patient returns to the office as follow-up on her sleep apnea. Since last seen, she had a home sleep study on July 6, which showed AHI of 19. She started an auto CPAP. Feels better using it. More energy. Less sleepiness. Download the last month, 100% compliance, AHI 3.4, auto CPAP 5-10 cm water.   Review of Systems  Constitutional: Negative.  Negative for fever and unexpected weight change.  HENT: Positive for congestion and postnasal drip. Negative for dental problem, ear pain, nosebleeds, rhinorrhea, sneezing, sore throat and trouble swallowing.   Eyes: Positive for redness and itching.  Respiratory: Positive for cough. Negative for chest tightness, shortness of breath and wheezing.   Cardiovascular: Negative.  Negative for palpitations and leg swelling.  Gastrointestinal: Negative.  Negative for nausea and vomiting.  Endocrine: Negative.   Genitourinary: Negative.  Negative for dysuria.  Musculoskeletal: Positive for arthralgias and back pain. Negative for joint swelling.  Skin: Negative.  Negative for rash.  Allergic/Immunologic: Positive for environmental allergies.  Neurological: Positive for headaches.  Hematological: Negative.  Does not bruise/bleed easily.  Psychiatric/Behavioral: Negative.  Negative for dysphoric mood. The patient is not nervous/anxious.       Objective:   Physical Exam   Vitals:  Vitals:   02/27/16 1027  BP: (!) 142/88  Pulse: 78  SpO2: 96%  Weight: 228 lb (103.4 kg)  Height: 5\' 6"  (1.676 m)    Constitutional/General:  Pleasant, well-nourished, well-developed, not in any distress,  Comfortably seating.  Well kempt  Body mass index is 36.8 kg/m. Wt  Readings from Last 3 Encounters:  02/27/16 228 lb (103.4 kg)  01/16/16 228 lb 9.6 oz (103.7 kg)  11/14/15 232 lb (105.2 kg)     HEENT: Pupils equal and reactive to light and accommodation. Anicteric sclerae. Normal nasal mucosa.   No oral  lesions,  mouth clear,  oropharynx clear, no postnasal drip. (-) Oral thrush. No dental caries.  Airway - Mallampati class III  Neck: No masses. Midline trachea. No JVD, (-) LAD. (-) bruits appreciated.  Respiratory/Chest: Grossly normal chest. (-) deformity. (-) Accessory muscle use.  Symmetric expansion. (-) Tenderness on palpation.  Resonant on percussion.  Diminished BS on both lower lung zones. (-) wheezing, crackles, rhonchi (-) egophony  Cardiovascular: Regular rate and  rhythm, heart sounds normal, no murmur or gallops, no peripheral edema  Gastrointestinal:  Normal bowel sounds. Soft, non-tender. No hepatosplenomegaly.  (-) masses.   Musculoskeletal:  Normal muscle tone. Normal gait.   Extremities: Grossly normal. (-) clubbing, cyanosis.  (-) edema  Skin: (-) rash,lesions seen.   Neurological/Psychiatric : alert, oriented to time, place, person. Normal mood and affect           Assessment & Plan:  OSA (obstructive sleep apnea) Pt has snoring. Denies gasping, choking. Denies witnessed apneas.  (+) sleep talking. Has legs moving with occasional dreaming. (-) other abn behavior in sleep.  Noted to have bruxism by dentist.   Has unrefreshed sleep.  Mild hypersomnia.   HST (12/28/15)  AHI 19. autocpap 5-10 cm water.   Feels better using auto CPAP. More energy. Less sleepiness. No issues. Less bruxism. Done  on the last month, 100%, AHI 3.4. Less nightmares. Feels benefit of cpap.   Plan : We extensively discussed the importance of treating OSA and the need to use PAP therapy.   Continue with autocpap 5-10 cm water.    Patient was instructed to have mask, tubings, filter, reservoir cleaned at least once a week with soapy  water.  Patient was instructed to call the office if he/she is having issues with the PAP device.    I advised patient to obtain sufficient amount of sleep --  7 to 8 hours at least in a 24 hr period.  Patient was advised to follow good sleep hygiene.  Patient was advised NOT to engage in activities requiring concentration and/or vigilance if he/she is and  sleepy.  Patient is NOT to drive if he/she is sleepy.    Bruxism With bruxism. Better with cpap. Will observe.   Obesity Weight reduction  Allergic rhinitis Sees Allergist. On Claritin D 24, Azelastine and nasonex and saline. Allergy injections q 2 weeks.       Return to clinic in 6 mos.      Monica Becton, MD 02/27/2016   10:46 AM [facility] [place] Pager: [phone] Office: [phone], Fax: [phone]

## 2016-02-27 NOTE — Patient Instructions (Signed)
  It was a pleasure taking care of you today!  Continue using your CPAP machine.   Please make sure you use your CPAP device everytime you sleep.  We will monitor the usage of your machine per your insurance requirement.  Your insurance company may take the machine from you if you are not using it regularly.   Please clean the mask, tubings, filter, water reservoir with soapy water every week.  Please use distilled water for the water reservoir.   Please call the office or your machine provider (DME company) if you are having issues with the device.   Return to clinic in 6 months with Dr. Corrie Dandy or NP.

## 2016-02-27 NOTE — Assessment & Plan Note (Addendum)
Sees Allergist. On Claritin D 24, Azelastine and nasonex and saline. Allergy injections q 2 weeks.

## 2016-02-27 NOTE — Assessment & Plan Note (Addendum)
With bruxism. Better with cpap. Will observe.

## 2016-02-27 NOTE — Assessment & Plan Note (Addendum)
Pt has snoring. Denies gasping, choking. Denies witnessed apneas.  (+) sleep talking. Has legs moving with occasional dreaming. (-) other abn behavior in sleep.  Noted to have bruxism by dentist.   Has unrefreshed sleep.  Mild hypersomnia.   HST (12/28/15)  AHI 19. autocpap 5-10 cm water.   Feels better using auto CPAP. More energy. Less sleepiness. No issues. Less bruxism. Done on the last month, 100%, AHI 3.4. Less nightmares. Feels benefit of cpap.   Plan : We extensively discussed the importance of treating OSA and the need to use PAP therapy.   Continue with autocpap 5-10 cm water.    Patient was instructed to have mask, tubings, filter, reservoir cleaned at least once a week with soapy water.  Patient was instructed to call the office if he/she is having issues with the PAP device.    I advised patient to obtain sufficient amount of sleep --  7 to 8 hours at least in a 24 hr period.  Patient was advised to follow good sleep hygiene.  Patient was advised NOT to engage in activities requiring concentration and/or vigilance if he/she is and  sleepy.  Patient is NOT to drive if he/she is sleepy.

## 2016-02-27 NOTE — Assessment & Plan Note (Signed)
Weight reduction 

## 2016-03-06 ENCOUNTER — Encounter: Payer: Self-pay | Admitting: Family Medicine

## 2016-03-07 ENCOUNTER — Encounter: Payer: Self-pay | Admitting: Family Medicine

## 2016-03-15 ENCOUNTER — Encounter: Payer: Self-pay | Admitting: Pulmonary Disease

## 2016-03-27 ENCOUNTER — Encounter: Payer: Self-pay | Admitting: Cardiology

## 2016-03-28 LAB — HM DIABETES EYE EXAM

## 2016-04-01 ENCOUNTER — Encounter: Payer: Self-pay | Admitting: Family Medicine

## 2016-04-10 ENCOUNTER — Ambulatory Visit: Payer: Medicare Other | Admitting: Cardiology

## 2016-04-16 ENCOUNTER — Encounter: Payer: Self-pay | Admitting: Family Medicine

## 2016-04-16 DIAGNOSIS — E119 Type 2 diabetes mellitus without complications: Secondary | ICD-10-CM

## 2016-04-23 ENCOUNTER — Encounter: Payer: Self-pay | Admitting: Family Medicine

## 2016-05-07 ENCOUNTER — Encounter: Payer: Self-pay | Admitting: Family Medicine

## 2016-05-08 ENCOUNTER — Encounter: Payer: Self-pay | Admitting: Family Medicine

## 2016-05-09 ENCOUNTER — Encounter: Payer: Self-pay | Admitting: Family Medicine

## 2016-05-09 ENCOUNTER — Ambulatory Visit: Payer: Medicare Other | Admitting: Pulmonary Disease

## 2016-05-14 ENCOUNTER — Encounter: Payer: Self-pay | Admitting: Family Medicine

## 2016-05-14 ENCOUNTER — Other Ambulatory Visit (INDEPENDENT_AMBULATORY_CARE_PROVIDER_SITE_OTHER): Payer: Medicare Other

## 2016-05-14 DIAGNOSIS — E119 Type 2 diabetes mellitus without complications: Secondary | ICD-10-CM

## 2016-05-14 LAB — COMPREHENSIVE METABOLIC PANEL
ALT: 13 U/L (ref 0–35)
AST: 14 U/L (ref 0–37)
Albumin: 4.1 g/dL (ref 3.5–5.2)
Alkaline Phosphatase: 156 U/L — ABNORMAL HIGH (ref 39–117)
BUN: 15 mg/dL (ref 6–23)
CO2: 27 mEq/L (ref 19–32)
Calcium: 8.9 mg/dL (ref 8.4–10.5)
Chloride: 98 mEq/L (ref 96–112)
Creatinine, Ser: 0.66 mg/dL (ref 0.40–1.20)
GFR: 95.39 mL/min (ref >60.00)
Glucose, Bld: 122 mg/dL — ABNORMAL HIGH (ref 70–99)
Potassium: 4.3 mEq/L (ref 3.5–5.1)
Sodium: 134 mEq/L — ABNORMAL LOW (ref 135–145)
Total Bilirubin: 0.4 mg/dL (ref 0.2–1.2)
Total Protein: 6.6 g/dL (ref 6.0–8.3)

## 2016-05-14 LAB — MICROALBUMIN / CREATININE URINE RATIO
Creatinine,U: 56 mg/dL
Microalb Creat Ratio: 1.4 mg/g (ref 0.0–30.0)
Microalb, Ur: 0.8 mg/dL (ref 0.0–1.9)

## 2016-05-14 LAB — CBC
HCT: 38.1 % (ref 36.0–46.0)
Hemoglobin: 12.6 g/dL (ref 12.0–15.0)
MCHC: 33 g/dL (ref 30.0–36.0)
MCV: 84.4 fl (ref 78.0–100.0)
Platelets: 315 10*3/uL (ref 150.0–400.0)
RBC: 4.52 Mil/uL (ref 3.87–5.11)
RDW: 13.9 % (ref 11.5–15.5)
WBC: 8.7 10*3/uL (ref 4.0–10.5)

## 2016-05-14 LAB — LIPID PANEL
Cholesterol: 148 mg/dL (ref 0–200)
HDL: 54.4 mg/dL (ref >39.00)
LDL Cholesterol: 72 mg/dL (ref 0–99)
NonHDL: 93.32
Total CHOL/HDL Ratio: 3
Triglycerides: 107 mg/dL (ref 0.0–149.0)
VLDL: 21.4 mg/dL (ref 0.0–40.0)

## 2016-05-14 LAB — HEMOGLOBIN A1C: Hgb A1c MFr Bld: 6.6 % — ABNORMAL HIGH (ref 4.6–6.5)

## 2016-05-19 ENCOUNTER — Other Ambulatory Visit: Payer: Self-pay | Admitting: Family Medicine

## 2016-05-21 ENCOUNTER — Encounter: Payer: Self-pay | Admitting: Family Medicine

## 2016-05-21 ENCOUNTER — Ambulatory Visit (INDEPENDENT_AMBULATORY_CARE_PROVIDER_SITE_OTHER): Payer: Medicare Other | Admitting: Family Medicine

## 2016-05-21 VITALS — BP 134/78 | HR 84 | Temp 98.0°F | Ht 65.5 in | Wt 224.2 lb

## 2016-05-21 DIAGNOSIS — E119 Type 2 diabetes mellitus without complications: Secondary | ICD-10-CM

## 2016-05-21 DIAGNOSIS — Z23 Encounter for immunization: Secondary | ICD-10-CM

## 2016-05-21 DIAGNOSIS — G43109 Migraine with aura, not intractable, without status migrainosus: Secondary | ICD-10-CM

## 2016-05-21 DIAGNOSIS — Z780 Asymptomatic menopausal state: Secondary | ICD-10-CM | POA: Diagnosis not present

## 2016-05-21 DIAGNOSIS — Z0000 Encounter for general adult medical examination without abnormal findings: Secondary | ICD-10-CM

## 2016-05-21 MED ORDER — SUMATRIPTAN SUCCINATE 100 MG PO TABS
ORAL_TABLET | ORAL | 3 refills | Status: DC
Start: 2016-05-21 — End: 2017-01-20

## 2016-05-21 NOTE — Assessment & Plan Note (Signed)
Migraines had slowed to once every 3 months. Now HA 10 days on and off frontal. No aura.  ibuprofen doesn't help. Advised trial neti pot in case some deeper sinus congestion (though nares appear normal), and also try her imitrex

## 2016-05-21 NOTE — Assessment & Plan Note (Signed)
Looks great with increased exercise. Also on metformin 500mg  BID. Weight down 4 lbs from last visit Lab Results  Component Value Date   HGBA1C 6.6 (H) 05/14/2016

## 2016-05-21 NOTE — Progress Notes (Signed)
Phone: [phone]  Subjective:  Patient presents today for their annual physical. Chief complaint-noted.   See problem oriented charting- ROS- full  review of systems was completed and negative except for: headache without aura. No chest pain or shortness of breath. No  blurry vision.   The following were reviewed and entered/updated in epic: Past Medical History:  Diagnosis Date  . Allergy   . Anxiety   . Arthritis    back & knee  . Asthma    /w resp. upset   . Colon polyps   . Diabetes mellitus without complication ([place])   . Gastric polyps   . Gastroparesis   . GERD (gastroesophageal reflux disease)   . History of migraine headaches   . Hyperlipidemia   . IBS (irritable bowel syndrome)   . Lumbar disc disease    Patient Active Problem List   Diagnosis Date Noted  . Diabetes mellitus type 2, controlled ([place]) 05/12/2014    Priority: High  . OSA (obstructive sleep apnea) 02/27/2016    Priority: Medium  . Elevated alkaline phosphatase level 05/16/2015    Priority: Medium  . Moderate aortic stenosis 05/12/2014    Priority: Medium  . GERD (gastroesophageal reflux disease) 03/10/2014    Priority: Medium  . Generalized anxiety disorder 03/10/2014    Priority: Medium  . Migraines 03/10/2014    Priority: Medium  . Irritable bowel syndrome 12/15/2008    Priority: Medium  . OBESITY, MORBID 10/12/2008    Priority: Medium  . Hyperlipidemia 02/09/2008    Priority: Medium  . Bruxism 10/23/2015    Priority: Low  . Atypical chest pain 11/10/2014    Priority: Low  . Acne 05/12/2014    Priority: Low  . EUSTACHIAN TUBE DYSFUNCTION, CHRONIC 06/14/2010    Priority: Low  . Low back pain 08/11/2008    Priority: Low  . Allergic rhinitis 02/12/2007    Priority: Low  . ASTHMA 02/12/2007    Priority: Low   Past Surgical History:  Procedure Laterality Date  . ABDOMINAL HYSTERECTOMY    . BLADDER SUSPENSION  2007  . BREAST ENHANCEMENT SURGERY  1980  . CARPAL TUNNEL RELEASE  Bilateral   . DORSAL COMPARTMENT RELEASE Right 09/15/2014   Procedure: RIGHT WRIST DEQUERVAINS RELEASE ;  Surgeon: Kathryne Hitch, MD;  Location: [facility];  Service: Orthopedics;  Laterality: Right;  . ESOPHAGOGASTRODUODENOSCOPY    . KNEE ARTHROSCOPY  04/15/2012   Procedure: ARTHROSCOPY KNEE;  Surgeon: [name], MD;  Location: [facility];  Service: Orthopedics;  Laterality: Right;  . LAPAROSCOPIC CHOLECYSTECTOMY    . LASIK    . OOPHORECTOMY    . PALATE TO GINGIVA GRAFT  2017  . ROTATOR CUFF REPAIR Left   . TOTAL KNEE ARTHROPLASTY  04/15/2012   Procedure: TOTAL KNEE ARTHROPLASTY;  Surgeon: [name], MD;  Location: [facility];  Service: Orthopedics;  Laterality: Right;  . TRIGGER FINGER RELEASE Right 04/20/2015   Procedure: RIGHT TRIGGER FINGER RELEASE (TENDON SHEALTH INCISION) ;  Surgeon: [name], MD;  Location: [facility];  Service: Orthopedics;  Laterality: Right;    Family History  Problem Relation Age of Onset  . COPD Mother   . Colon polyps Mother   . Irritable bowel syndrome Mother   . Heart disease Father   . Alcohol abuse Father   . Colon polyps Maternal Aunt     Medications- reviewed and updated Current Outpatient Prescriptions  Medication Sig Dispense Refill  . aspirin 81 MG tablet  Take 1 tablet (81 mg total) by mouth daily. 30 tablet 0  . atorvastatin (LIPITOR) 40 MG tablet TAKE 1 TABLET BY MOUTH AT NIGHT ON TUESDAY AND FRIDAY 30 tablet 5  . azelastine (ASTELIN) 137 MCG/SPRAY nasal spray Place 1 spray into the nose 2 (two) times daily.     . Blood Glucose Monitoring Suppl (ONETOUCH VERIO) W/DEVICE KIT 1 kit by Does not apply route once. 1 kit 0  . calcium-vitamin D (OSCAL WITH D) 500-200 MG-UNIT per tablet Take 1 tablet by mouth daily.    . fluticasone (FLONASE) 50 MCG/ACT nasal spray     . glucose blood (ONETOUCH VERIO) test strip Use to test blood sugars daily. Dx: E11.9 100 each 12  . KLS ALLERCLEAR D-24HR 10-240 MG 24 hr  tablet TAKE 1 TABLET BY MOUTH DAILY. 30 tablet 11  . Lancets (ONETOUCH ULTRASOFT) lancets Use to test blood sugars daily. Dx: E11.9 100 each 12  . metFORMIN (GLUCOPHAGE) 500 MG tablet Take 1 tablet with breakfast and 1/2 to 1 tablet with dinner. 60 tablet 5  . metoCLOPramide (REGLAN) 10 MG tablet TAKE 1/2 TABLET BY MOUTH 4 TIMES A DAY 60 tablet 5  . nabumetone (RELAFEN) 750 MG tablet TAKE 2 TABLETS BY MOUTH EVERY DAY 60 tablet 5  . omeprazole (PRILOSEC) 40 MG capsule Take 1 capsule (40 mg total) by mouth daily. 30 capsule 11  . Probiotic Product (ALIGN) 4 MG CAPS Take by mouth.    . psyllium (METAMUCIL SMOOTH TEXTURE) 28 % packet Take 1 packet by mouth 2 (two) times daily.     . SUMAtriptan (IMITREX) 100 MG tablet TAKE 1 TABLET BY MOUTH EVERY 2 HOURS AS NEEDED FOR MIGRAINE 10 tablet 3  . venlafaxine XR (EFFEXOR-XR) 150 MG 24 hr capsule TAKE ONE CAPSULE BY MOUTH EVERY DAY WITH BREAKFAST 30 capsule 5   No current facility-administered medications for this visit.     Allergies-reviewed and updated Allergies  Allergen Reactions  . Erythromycin Nausea Only  . Penicillins Rash    Social History   Social History  . Marital status: Married    Spouse name: N/A  . Number of children: 1  . Years of education: N/A   Occupational History  . Retired Retired   Social History Main Topics  . Smoking status: Never Smoker  . Smokeless tobacco: Never Used  . Alcohol use No  . Drug use: No  . Sexual activity: Yes   Other Topics Concern  . None   Social History Narrative   She lives with husband (1978) and two dogs. Step-son [name] (1971-psychiatrist in [place]). No grandkids. 2 dogs-sheltie/collie mix and border collie mix      Highest level of education:  Master in education   She is retired Animal nutritionist x 32 years.      Hobbies: time with dogs             Objective: BP 134/78 (BP Location: Left Arm, Patient Position: Sitting, Cuff Size: Large)   Pulse 84   Temp 98 F  (36.7 C) (Oral)   Ht 5' 5.5" (1.664 m)   Wt 224 lb 3.2 oz (101.7 kg)   SpO2 97%   BMI 36.74 kg/m  Gen: NAD, resting comfortably HEENT: Mucous membranes are moist. Oropharynx normal Neck: no thyromegaly CV: RRR 3/6 SEM Lungs: CTAB no crackles, wheeze, rhonchi Abdomen: soft/nontender/nondistended/normal bowel sounds. No rebound or guarding.  Ext: no edema Skin: warm, dry Neuro: grossly normal, moves all extremities, PERRLA  Diabetic  Foot Exam - Simple   Simple Foot Form Diabetic Foot exam was performed with the following findings:  Yes 05/21/2016  9:14 AM  Visual Inspection No deformities, no ulcerations, no other skin breakdown bilaterally:  Yes Sensation Testing Intact to touch and monofilament testing bilaterally:  Yes Pulse Check Posterior Tibialis and Dorsalis pulse intact bilaterally:  Yes Comments      Assessment/Plan:  65 y.o. female presenting for annual physical.  Health Maintenance counseling: 1. Anticipatory guidance: Patient counseled regarding regular dental exams, eye exams, wearing seatbelts.  2. Risk factor reduction:  Advised patient of need for regular exercise (great job with this- 3x a week and there at least an hour) and diet rich and fruits and vegetables to reduce risk of heart attack and stroke. Continues to lose weight with effort!  3. Immunizations/screenings/ancillary studies Immunization History  Administered Date(s) Administered  . Influenza Split 04/04/2011, 03/05/2012  . Influenza Whole 04/16/2007, 03/16/2009, 03/13/2010  . Influenza,inj,Quad PF,36+ Mos 03/30/2013, 03/10/2014, 03/23/2015, 02/27/2016  . Pneumococcal Conjugate-13 05/21/2016  . Td 06/24/1996, 08/06/2007  . Zoster 05/12/2014   Health Maintenance Due  Topic Date Due  . DEXA SCAN - order today 11/26/2015  . FOOT EXAM - normal today 05/15/2016   4. Cervical cancer screening- hysterectomy for benign reasons- endometriosis. States "took everything". Dr. Ouida Sills October 10th- no  pap at that time.  5. Breast cancer screening-  breast exam with Dr. Ouida Sills and mammogram 10/11/15 6. Colon cancer screening - 07/28/14 with 10 year follow up 7. Skin cancer screening- Dr. Elvera Lennox  Status of chronic or acute concerns  We reviewed and updated problem list and overview- she is doing well. Will see cardiology in December for aortic stenosis. Pulmonology next year for CPAP follow up. We will monitor mild elevation in alk phos and get GGT if >2x ULN. HLD controlled on atorvastatin 54m. GAD controlled on cymbalta.   Diabetes mellitus type 2, [place] Looks great with increased exercise. Also on metformin 5026mBID. Weight down 4 lbs from last visit Lab Results  Component Value Date   HGBA1C 6.6 (H) 05/14/2016     Migraines Migraines had slowed to once every 3 months. Now HA 10 days on and off frontal. No aura.  ibuprofen doesn't help. Advised trial neti pot in case some deeper sinus congestion (though nares appear normal), and also try her imitrex  4 months  Orders Placed This Encounter  Procedures  . DG Bone Density    Standing Status:   Future    Standing Expiration Date:   07/21/2017    Order Specific Question:   Reason for Exam (SYMPTOM  OR DIAGNOSIS REQUIRED)    Answer:   postmenopausal screen osteoporosis    Order Specific Question:   Preferred imaging location?    Answer:   [facility]. Pneumococcal conjugate vaccine 13-valent IM    Meds ordered this encounter  Medications  . SUMAtriptan (IMITREX) 100 MG tablet    Sig: TAKE 1 TABLET BY MOUTH EVERY 2 HOURS AS NEEDED FOR MIGRAINE    Dispense:  10 tablet    Refill:  3    Return precautions advised.   StGarret ReddishMD

## 2016-05-21 NOTE — Progress Notes (Signed)
Pre visit review using our clinic review tool, if applicable. No additional management support is needed unless otherwise documented below in the visit note. 

## 2016-05-21 NOTE — Patient Instructions (Addendum)
Schedule your bone density test at check out desk  [place] job with exercise and weight loss! Keep up the great work- all #s going in the right direction!   Discuss relafen risk

## 2016-05-23 ENCOUNTER — Ambulatory Visit (INDEPENDENT_AMBULATORY_CARE_PROVIDER_SITE_OTHER)
Admission: RE | Admit: 2016-05-23 | Discharge: 2016-05-23 | Disposition: A | Discharge status: Home / Self Care | Payer: Medicare Other | Source: Ambulatory Visit | Attending: Family Medicine | Admitting: Family Medicine

## 2016-05-23 DIAGNOSIS — Z780 Asymptomatic menopausal state: Secondary | ICD-10-CM | POA: Diagnosis not present

## 2016-05-24 ENCOUNTER — Other Ambulatory Visit: Payer: Self-pay

## 2016-05-27 ENCOUNTER — Encounter: Payer: Self-pay | Admitting: Family Medicine

## 2016-05-27 DIAGNOSIS — M8580 Other specified disorders of bone density and structure, unspecified site: Secondary | ICD-10-CM | POA: Insufficient documentation

## 2016-05-30 ENCOUNTER — Other Ambulatory Visit: Payer: Self-pay | Admitting: Family Medicine

## 2016-06-18 ENCOUNTER — Ambulatory Visit (INDEPENDENT_AMBULATORY_CARE_PROVIDER_SITE_OTHER): Payer: Medicare Other | Admitting: Cardiology

## 2016-06-18 VITALS — BP 130/70 | HR 80 | Ht 65.5 in | Wt 225.0 lb

## 2016-06-18 DIAGNOSIS — E782 Mixed hyperlipidemia: Secondary | ICD-10-CM

## 2016-06-18 DIAGNOSIS — I208 Other forms of angina pectoris: Secondary | ICD-10-CM | POA: Diagnosis not present

## 2016-06-18 DIAGNOSIS — R0600 Dyspnea, unspecified: Secondary | ICD-10-CM

## 2016-06-18 DIAGNOSIS — I350 Nonrheumatic aortic (valve) stenosis: Secondary | ICD-10-CM | POA: Diagnosis not present

## 2016-06-18 DIAGNOSIS — R0609 Other forms of dyspnea: Secondary | ICD-10-CM

## 2016-06-18 NOTE — Patient Instructions (Signed)
Medication Instructions:  Your physician recommends that you continue on your current medications as directed. Please refer to the Current Medication list given to you today.   Labwork: None Ordered   Testing/Procedures: Your physician has requested that you have an echocardiogram. Echocardiography is a painless test that uses sound waves to create images of your heart. It provides your doctor with information about the size and shape of your heart and how well your heart's chambers and valves are working. This procedure takes approximately one hour. There are no restrictions for this procedure.   Follow-Up: Your physician recommends that you schedule a follow-up appointment in: 3 months with Dr. Meda Coffee   If you need a refill on your cardiac medications before your next appointment, please call your pharmacy.   Thank you for choosing [facility]! Christen Bame, RN [phone]

## 2016-06-18 NOTE — Progress Notes (Signed)
Patient ID: [name], female   DOB: 1950-07-06, 65 y.o.   MRN: [id number]      Cardiology Office Note  Date:  06/18/2016   ID:  [name] [birth year], MRN [id number]  PCP:  Garret Reddish, MD  Cardiologist:  Ena Dawley, MD   Chief complain: chest pain  History of Present Illness: [name] is a 65 y.o. female who presents for evaluation of chest pain.The patient has h/o DM, HLP that has developed exertional chest pain intermittent chest pain, treated for GERD, but this pain feels differently. The pain is increasing in frequency now to once a week. Used to just occur quickly and would take breath away and would feel some radiation to back or left shoulder. Most recent one, woke her up from sleep. The last episode happen before eating at a restaurant, lasted for about 30 minutes and resolved, no obvious alleviating or provoking factors. No improvement with Prilosec. There is also associate SOB, no palpitations or syncope, no LE edema, orthopnea, PND. No significant FH of premature CAD, she has never smoked.   03/28/2015 - the patient is coming after 1 year, she had a negative stress test a year ago. No prior infarct and no ischemia. Echocardiogram showed moderate aortic stenosis. Today she states that she hasn't experienced chest pain since then, her major limitation is back pain, minimal DOE, no palpitations or syncope, no LE edema, orthopnea or PND.  06/18/2016 - the patient is coming after one year, she denies any chest pain but continues to have progressively worsening shortness of breath, she so forces herself to go to gym 3 times feels profoundly tired afterwards and not able to do much for the rest of the day. She denies any palpitations or syncope. No significant dizziness other than early morning when she gets up. Lower extremity edema orthopnea or proximal nocturnal dyspnea.  Past Medical History:  Diagnosis Date  . Allergy   . Anxiety   .  Arthritis    back & knee  . Asthma    /w resp. upset   . Colon polyps   . Diabetes mellitus without complication ([place])   . Gastric polyps   . Gastroparesis   . GERD (gastroesophageal reflux disease)   . History of migraine headaches   . Hyperlipidemia   . IBS (irritable bowel syndrome)   . Lumbar disc disease    Past Surgical History:  Procedure Laterality Date  . ABDOMINAL HYSTERECTOMY    . BLADDER SUSPENSION  2007  . BREAST ENHANCEMENT SURGERY  1980  . CARPAL TUNNEL RELEASE Bilateral   . DORSAL COMPARTMENT RELEASE Right 09/15/2014   Procedure: RIGHT WRIST DEQUERVAINS RELEASE ;  Surgeon: Kathryne Hitch, MD;  Location: [place];  Service: Orthopedics;  Laterality: Right;  . ESOPHAGOGASTRODUODENOSCOPY    . KNEE ARTHROSCOPY  04/15/2012   Procedure: ARTHROSCOPY KNEE;  Surgeon: [name], MD;  Location: [place];  Service: Orthopedics;  Laterality: Right;  . LAPAROSCOPIC CHOLECYSTECTOMY    . LASIK    . OOPHORECTOMY    . PALATE TO GINGIVA GRAFT  2017  . ROTATOR CUFF REPAIR Left   . TOTAL KNEE ARTHROPLASTY  04/15/2012   Procedure: TOTAL KNEE ARTHROPLASTY;  Surgeon: [name], MD;  Location: [place];  Service: Orthopedics;  Laterality: Right;  . TRIGGER FINGER RELEASE Right 04/20/2015   Procedure: RIGHT TRIGGER FINGER RELEASE (TENDON SHEALTH INCISION) ;  Surgeon: [name], MD;  Location: [place];  Service: Orthopedics;  Laterality: Right;   Current Outpatient Prescriptions  Medication Sig Dispense Refill  . aspirin 81 MG tablet Take 1 tablet (81 mg total) by mouth daily. 30 tablet 0  . atorvastatin (LIPITOR) 40 MG tablet TAKE 1 TABLET BY MOUTH AT NIGHT ON TUESDAY AND FRIDAY 30 tablet 5  . Blood Glucose Monitoring Suppl (ONETOUCH VERIO) W/DEVICE KIT 1 kit by Does not apply route once. 1 kit 0  . calcium-vitamin D (OSCAL WITH D) 500-200 MG-UNIT per tablet Take 1 tablet by mouth daily.    . fluticasone (FLONASE) 50 MCG/ACT nasal spray       . glucose blood (ONETOUCH VERIO) test strip Use to test blood sugars daily. Dx: E11.9 100 each 12  . ipratropium (ATROVENT) 0.03 % nasal spray Place 2 sprays into both nostrils 3 (three) times daily as needed.  3  . KLS ALLERCLEAR D-24HR 10-240 MG 24 hr tablet TAKE 1 TABLET BY MOUTH DAILY. 30 tablet 11  . Lancets (ONETOUCH ULTRASOFT) lancets Use to test blood sugars daily. Dx: E11.9 100 each 12  . metFORMIN (GLUCOPHAGE) 500 MG tablet TAKE 1 TABLET WITH BREAKFAST AND 1/2 TO 1 TABLET WITH DINNER. 60 tablet 5  . metoCLOPramide (REGLAN) 10 MG tablet TAKE 1/2 TABLET BY MOUTH 4 TIMES A DAY 60 tablet 5  . nabumetone (RELAFEN) 750 MG tablet TAKE 2 TABLETS BY MOUTH EVERY DAY 60 tablet 5  . omeprazole (PRILOSEC) 40 MG capsule Take 1 capsule (40 mg total) by mouth daily. 30 capsule 11  . Probiotic Product (ALIGN) 4 MG CAPS Take by mouth.    . psyllium (METAMUCIL SMOOTH TEXTURE) 28 % packet Take 1 packet by mouth 2 (two) times daily.     . SUMAtriptan (IMITREX) 100 MG tablet TAKE 1 TABLET BY MOUTH EVERY 2 HOURS AS NEEDED FOR MIGRAINE 10 tablet 3  . venlafaxine XR (EFFEXOR-XR) 150 MG 24 hr capsule TAKE ONE CAPSULE BY MOUTH EVERY DAY WITH BREAKFAST 30 capsule 5   No current facility-administered medications for this visit.    Allergies:   Erythromycin and Penicillins   Social History:  The patient  reports that she has never smoked. She has never used smokeless tobacco. She reports that she does not drink alcohol or use drugs.   Family History:  The patient's family history includes Alcohol abuse in her father; COPD in her mother; Colon polyps in her maternal aunt and mother; Heart disease in her father; Irritable bowel syndrome in her mother.   ROS:  Please see the history of present illness.   Otherwise, review of systems are positive for none.   All other systems are reviewed and negative.   PHYSICAL EXAM: VS:  BP 130/70   Pulse 80   Ht 5' 5.5" (1.664 m)   Wt 225 lb (102.1 kg)   BMI 36.87 kg/m  ,  BMI Body mass index is 36.87 kg/m. GEN: Well nourished, well developed, in no acute distress  HEENT: normal  Neck: no JVD, carotid bruits, or masses Cardiac: RRR; 4/6 systolic murmur, S2 present, rubs, or gallops,no edema  Respiratory:  clear to auscultation bilaterally, normal work of breathing GI: soft, nontender, nondistended, + BS MS: no deformity or atrophy  Skin: warm and dry, no rash Neuro:  Strength and sensation are intact Psych: euthymic mood, full affect  EKG:  EKG is ordered today. It shows sinus rhythm with nonspecific ST-T wave abnormalities unchanged from last year.  Echo: 04/2014 Left ventricle: The cavity size was normal. Wall  thickness was increased in a pattern of mild LVH. Systolic function was normal. The estimated ejection fraction was in the range of 60% to 65%. Wall motion was normal; there were no regional wall motion abnormalities. Doppler parameters are consistent with abnormal left ventricular relaxation (grade 1 diastolic dysfunction). - Aortic valve: A bicuspid morphology cannot be excluded; mildly thickened, mildly calcified leaflets. There was moderate stenosis. There was mild regurgitation.  Recent Labs: 05/14/2016: ALT 13; BUN 15; Creatinine, Ser 0.66; Hemoglobin 12.6; Platelets 315.0; Potassium 4.3; Sodium 134   Lipid Panel    Component Value Date/Time   CHOL 148 05/14/2016 0818   TRIG 107.0 05/14/2016 0818   HDL 54.40 05/14/2016 0818   CHOLHDL 3 05/14/2016 0818   VLDL 21.4 05/14/2016 0818   LDLCALC 72 05/14/2016 0818   LDLDIRECT 84.0 11/09/2014 1117   Wt Readings from Last 3 Encounters:  06/18/16 225 lb (102.1 kg)  05/21/16 224 lb 3.2 oz (101.7 kg)  02/27/16 228 lb (103.4 kg)    Lexiscan nuclear stress test: 12/2014  Nuclear stress EF: 60%.  The study is normal.  This is a low risk study.  The left ventricular ejection fraction is normal (55-65%). Normal resting and stress perfusion EF 60%.  TTE: 05/2015 -  Left ventricle: The cavity size was normal. Wall thickness was   increased in a pattern of mild LVH. Systolic function was normal.   The estimated ejection fraction was in the range of 60% to 65%. - Aortic valve: Valve area (VTI): 0.76 cm^2. Valve area (Vmean):   0.72 cm^2. - Aorta: Moderate to severe AS. mean gradient has increased from 30   to 35 mmHg since 2015. Poorly visualized cannot r/o bicuspid   valve   suggest TEE to further evaluate. - Left atrium: The atrium was mildly dilated. - Atrial septum: No defect or patent foramen ovale was identified.    ASSESSMENT AND PLAN:  1. Moderate to severe aortic stenosis  - most probably bicuspid aortic valve, mean transaortic gradient is year ago 35 mmHg, by now most probably in a severe range. The patient has progressively worsening fatigue and exertional dyspnea, but still trying to be active. Echocardiogram showed normal size of the aortic root and ascending aorta. If aortic stenosis now in severe range we'll refer to Dr. Roxy Manns  2. Chest pain - normal Lexiscan nuclear stress test in 12/2014.  3. Hyperlipidemia - at goal with atorvastatin, no side effects. LDL in November 2017 was 72.  4. Diabetes mellitus - hemoglobin A1c improved to 6.6%.  Follow up in 3 months.  Signed, Ena Dawley, MD  06/18/2016 12:20 PM    [facility] [address], [place], [place]  [address] Phone: [phone]; Fax: [phone]

## 2016-07-05 ENCOUNTER — Ambulatory Visit (HOSPITAL_COMMUNITY): Payer: Medicare Other | Attending: Cardiology

## 2016-07-05 ENCOUNTER — Other Ambulatory Visit: Payer: Self-pay

## 2016-07-05 ENCOUNTER — Encounter: Payer: Self-pay | Admitting: Cardiology

## 2016-07-05 DIAGNOSIS — I350 Nonrheumatic aortic (valve) stenosis: Secondary | ICD-10-CM | POA: Diagnosis not present

## 2016-07-05 DIAGNOSIS — I082 Rheumatic disorders of both aortic and tricuspid valves: Secondary | ICD-10-CM | POA: Diagnosis not present

## 2016-07-05 DIAGNOSIS — E669 Obesity, unspecified: Secondary | ICD-10-CM | POA: Insufficient documentation

## 2016-07-05 DIAGNOSIS — Z6836 Body mass index (BMI) 36.0-36.9, adult: Secondary | ICD-10-CM | POA: Diagnosis not present

## 2016-07-05 DIAGNOSIS — E785 Hyperlipidemia, unspecified: Secondary | ICD-10-CM | POA: Diagnosis not present

## 2016-07-05 DIAGNOSIS — E119 Type 2 diabetes mellitus without complications: Secondary | ICD-10-CM | POA: Insufficient documentation

## 2016-07-08 ENCOUNTER — Telehealth: Payer: Self-pay | Admitting: *Deleted

## 2016-07-08 DIAGNOSIS — I350 Nonrheumatic aortic (valve) stenosis: Secondary | ICD-10-CM | POA: Insufficient documentation

## 2016-07-08 NOTE — Telephone Encounter (Signed)
-----   Message from Hayley Spark, MD sent at 07/05/2016  6:39 PM EST ----- The patient now has severe aortic stenosis, please refer her to Dr Roxy Manns for consideration of AVR.

## 2016-07-08 NOTE — Telephone Encounter (Signed)
Informed the pt that per Dr Meda Coffee, her echo now shows that she has severe aortic stenosis, and she recommends that we refer her to Dr Roxy Manns for consideration of AVR.  Informed the pt that I will place the referral in the system for her to have a new pt appt arranged with Dr Roxy Manns at [facility], and our [facility] scheduling will be calling her back to have this done.  Pt verbalized understanding and agrees with this plan.

## 2016-07-16 ENCOUNTER — Institutional Professional Consult (permissible substitution) (INDEPENDENT_AMBULATORY_CARE_PROVIDER_SITE_OTHER): Payer: Medicare Other | Admitting: Thoracic Surgery (Cardiothoracic Vascular Surgery)

## 2016-07-16 ENCOUNTER — Encounter: Payer: Self-pay | Admitting: Thoracic Surgery (Cardiothoracic Vascular Surgery)

## 2016-07-16 VITALS — BP 160/90 | HR 85 | Resp 20 | Ht 65.5 in | Wt 224.0 lb

## 2016-07-16 DIAGNOSIS — I350 Nonrheumatic aortic (valve) stenosis: Secondary | ICD-10-CM | POA: Diagnosis not present

## 2016-07-16 NOTE — Progress Notes (Signed)
[address]       [place] [address]             [phone]     CARDIOTHORACIC SURGERY CONSULTATION REPORT  Referring Provider is Dorothy Spark, MD PCP is Garret Reddish, MD  Chief Complaint  Patient presents with  . Aortic Stenosis    Surgical eval, ECHO 07/05/16    HPI:  Patient is a 66 year old obese female with history of aortic stenosis, atypical chest pain, obstructive sleep apnea on CPAP, GE reflux disease, and type 2 diabetes mellitus who has been referred for surgical consultation to discuss treatment options for management of severe symptomatic aortic stenosis. The patient has been obese for the majority of her adult life. She was followed for many years by Dr. Arnoldo Morale but more recently has been followed by Dr. Yong Channel subsequent to Dr. Arnoldo Morale retirement. Dr. Yong Channel noted a systolic murmur on physical exam and transthoracic echocardiogram performed in 2015 revealed normal left ventricular systolic function with moderate aortic stenosis, mean transvalvular gradient estimated 30 mmHg.. The patient began to experience symptoms of chest pain and was referred to Dr. Meda Coffee for cardiology consultation in 2016.  Nuclear stress test was performed and felt to be low risk with ejection fraction measured 60%.  The patient was started on low-dose aspirin and has been followed ever since by Dr. Meda Coffee.  She was seen in follow-up recently and noted to complain of progressive symptoms of exertional shortness of breath with worsening fatigue. Follow-up echocardiogram was performed 07/05/2016 revealed significant progression of aortic stenosis. Left ventricular function remained normal with ejection fraction estimated 55-60%. Peak velocity across the aortic valve was measured as high as 4.4 m/s corresponding to mean transvalvular gradient estimated 43 mmHg. The patient was referred for surgical consultation.  The patient is married and lives in [place] close to  [place] with her husband. 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 She lives a somewhat sedentary lifestyle although she does make an effort to go to the gym intermittently where she performs some low level aerobic exercises and uses weights. She also walks her dogs on a regular basis. She describes stable symptoms of exertional shortness of breath if she tries to push herself. She also has long history of chest pain and chest tightness that had features that are both consistent and atypical for angina pectoris. Symptoms may be brought on with activity but occasionally occur with rest. The patient has recently had some mild dizzy spells without syncope. She denies any resting shortness of breath, PND, orthopnea, or lower extremity edema. She has history of sleep apnea and recently started using CPAP at night.  Past Medical History:  Diagnosis Date  . Allergy   . Anxiety   . Arthritis    back & knee  . Asthma    /w resp. upset   . Colon polyps   . Diabetes mellitus without complication ([facility])   . Gastric polyps   . Gastroparesis   . GERD (gastroesophageal reflux disease)   . History of migraine headaches   . Hyperlipidemia   . IBS (irritable bowel syndrome)   . Lumbar disc disease     Past Surgical History:  Procedure Laterality Date  . ABDOMINAL HYSTERECTOMY    . BLADDER SUSPENSION  2007  . BREAST ENHANCEMENT SURGERY  1980  . CARPAL TUNNEL RELEASE Bilateral   .  DORSAL COMPARTMENT RELEASE Right 09/15/2014   Procedure: RIGHT WRIST DEQUERVAINS RELEASE ;  Surgeon: Kathryne Hitch, MD;  Location: [facility];  Service: Orthopedics;  Laterality: Right;  . ESOPHAGOGASTRODUODENOSCOPY    . KNEE ARTHROSCOPY  04/15/2012   Procedure: ARTHROSCOPY KNEE;  Surgeon: [name], MD;  Location: [facility];  Service: Orthopedics;  Laterality: Right;  . LAPAROSCOPIC CHOLECYSTECTOMY    . LASIK     . OOPHORECTOMY    . PALATE TO GINGIVA GRAFT  2017  . ROTATOR CUFF REPAIR Left   . TOTAL KNEE ARTHROPLASTY  04/15/2012   Procedure: TOTAL KNEE ARTHROPLASTY;  Surgeon: [name], MD;  Location: [facility];  Service: Orthopedics;  Laterality: Right;  . TRIGGER FINGER RELEASE Right 04/20/2015   Procedure: RIGHT TRIGGER FINGER RELEASE (TENDON SHEALTH INCISION) ;  Surgeon: [name], MD;  Location: [facility];  Service: Orthopedics;  Laterality: Right;    Family History  Problem Relation Age of Onset  . COPD Mother   . Colon polyps Mother   . Irritable bowel syndrome Mother   . Heart disease Father   . Alcohol abuse Father   . Colon polyps Maternal Aunt     Social History   Social History  . Marital status: Married    Spouse name: N/A  . Number of children: 1  . Years of education: N/A   Occupational History  . Retired Retired   Social History Main Topics  . Smoking status: Never Smoker  . Smokeless tobacco: Never Used  . Alcohol use No  . Drug use: No  . Sexual activity: Yes   Other Topics Concern  . Not on file   Social History Narrative   She lives with husband (1978) and two dogs. Step-son [name] (1971-psychiatrist in [place]). No grandkids. 2 dogs-sheltie/collie mix and border collie mix      Highest level of education:  Master in education   She is retired Animal nutritionist x 32 years.      Hobbies: time with dogs             Current Outpatient Prescriptions  Medication Sig Dispense Refill  . aspirin 81 MG tablet Take 1 tablet (81 mg total) by mouth daily. 30 tablet 0  . atorvastatin (LIPITOR) 40 MG tablet TAKE 1 TABLET BY MOUTH AT NIGHT ON TUESDAY AND FRIDAY 30 tablet 5  . Blood Glucose Monitoring Suppl (ONETOUCH VERIO) W/DEVICE KIT 1 kit by Does not apply route once. 1 kit 0  . calcium-vitamin D (OSCAL WITH D) 500-200 MG-UNIT per tablet Take 1 tablet by mouth daily.    . fluticasone (FLONASE) 50 MCG/ACT nasal spray     . glucose  blood (ONETOUCH VERIO) test strip Use to test blood sugars daily. Dx: E11.9 100 each 12  . ipratropium (ATROVENT) 0.03 % nasal spray Place 2 sprays into both nostrils 3 (three) times daily as needed.  3  . KLS ALLERCLEAR D-24HR 10-240 MG 24 hr tablet TAKE 1 TABLET BY MOUTH DAILY. 30 tablet 11  . Lancets (ONETOUCH ULTRASOFT) lancets Use to test blood sugars daily. Dx: E11.9 100 each 12  . metFORMIN (GLUCOPHAGE) 500 MG tablet TAKE 1 TABLET WITH BREAKFAST AND 1/2 TO 1 TABLET WITH DINNER. 60 tablet 5  . metoCLOPramide (REGLAN) 10 MG tablet TAKE 1/2 TABLET BY MOUTH 4 TIMES A DAY 60 tablet 5  . nabumetone (RELAFEN) 750 MG tablet TAKE 2 TABLETS BY MOUTH EVERY DAY 60 tablet 5  .  omeprazole (PRILOSEC) 40 MG capsule Take 1 capsule (40 mg total) by mouth daily. 30 capsule 11  . Probiotic Product (ALIGN) 4 MG CAPS Take by mouth.    . psyllium (METAMUCIL SMOOTH TEXTURE) 28 % packet Take 1 packet by mouth 2 (two) times daily.     . SUMAtriptan (IMITREX) 100 MG tablet TAKE 1 TABLET BY MOUTH EVERY 2 HOURS AS NEEDED FOR MIGRAINE 10 tablet 3  . venlafaxine XR (EFFEXOR-XR) 150 MG 24 hr capsule TAKE ONE CAPSULE BY MOUTH EVERY DAY WITH BREAKFAST 30 capsule 5   No current facility-administered medications for this visit.     Allergies  Allergen Reactions  . Erythromycin Nausea Only  . Penicillins Rash      Review of Systems:   General:  normal appetite, decreased energy, no weight gain, no weight loss, no fever  Cardiac:  + chest pain with exertion, + chest pain at rest, + SOB with exertion, no resting SOB, no PND, no orthopnea, no palpitations, no arrhythmia, no atrial fibrillation, no LE edema, + dizzy spells, no syncope  Respiratory:  + shortness of breath, no home oxygen, no productive cough, no dry cough, no bronchitis, no wheezing, no hemoptysis, + asthma, no pain with inspiration or cough, + sleep apnea, + CPAP at night  GI:   no difficulty swallowing, + reflux, + frequent heartburn, no hiatal hernia,  no abdominal pain, no constipation, no diarrhea, no hematochezia, no hematemesis, no melena  GU:   no dysuria,  no frequency, no urinary tract infection, no hematuria, no kidney stones, no kidney disease  Vascular:  no pain suggestive of claudication, no pain in feet, no leg cramps, no varicose veins, no DVT, no non-healing foot ulcer  Neuro:   no stroke, no TIA's, no seizures, no headaches, no temporary blindness one eye,  no slurred speech, no peripheral neuropathy, no chronic pain, no instability of gait, no memory/cognitive dysfunction  Musculoskeletal: + arthritis, no joint swelling, no myalgias, mild difficulty walking, normal mobility   Skin:   no rash, no itching, no skin infections, no pressure sores or ulcerations  Psych:   + anxiety, no depression, no nervousness, no unusual recent stress  Eyes:   no blurry vision, + floaters, no recent vision changes, + wears glasses or contacts  ENT:   + hearing loss, no loose or painful teeth, no dentures, last saw dentist Oct 2017  Hematologic:  no easy bruising, no abnormal bleeding, no clotting disorder, no frequent epistaxis  Endocrine:  + diabetes, does not check CBG's at home     Physical Exam:   BP (!) 160/90   Pulse 85   Resp 20   Ht 5' 5.5" (1.664 m)   Wt 224 lb (101.6 kg)   SpO2 95% Comment: RA  BMI 36.71 kg/m   General:  Obese, o/w  well-appearing  HEENT:  Unremarkable   Neck:   no JVD, no bruits, no adenopathy   Chest:   clear to auscultation, symmetrical breath sounds, no wheezes, no rhonchi   CV:   RRR, grade II-III/VI systolic murmur best RUSB  Abdomen:  soft, non-tender, no masses   Extremities:  warm, well-perfused, pulses diminished, no LE edema  Rectal/GU  Deferred  Neuro:   Grossly non-focal and symmetrical throughout  Skin:   Clean and dry, no rashes, no breakdown   Diagnostic Tests:  Transthoracic Echocardiography  Patient:    [name], [name] MR #:       [id number] Study Date: 07/05/2016 Gender:  F Age:        69 Height:     166.4 cm Weight:     102.1 kg BSA:        2.22 m^2 Pt. Status: Room:   SONOGRAPHER  [place], Will  ATTENDING    Ena Dawley, M.D.  ORDERING     Ena Dawley, M.D.  REFERRING    Ena Dawley, M.D.  PERFORMING   [facility], Outpatient  cc:  ------------------------------------------------------------------- LV EF: 55% -   60%  ------------------------------------------------------------------- Indications:      (I35.0).  ------------------------------------------------------------------- History:   PMH:  Acquired from the patient and from the patient&'s chart.  Chest pain.  Aortic stenosis.  Risk factors:  Diabetes mellitus. Obese. Dyslipidemia.  ------------------------------------------------------------------- Study Conclusions  - Left ventricle: The cavity size was normal. Wall thickness was   increased in a pattern of moderate LVH. Systolic function was   normal. The estimated ejection fraction was in the range of 55%   to 60%. Wall motion was normal; there were no regional wall   motion abnormalities. Doppler parameters are consistent with   abnormal left ventricular relaxation (grade 1 diastolic   dysfunction). - Aortic valve: Functionally bicuspid; severely thickened, severely   calcified leaflets. There was severe stenosis. - Atrial septum: No defect or patent foramen ovale was identified.  ------------------------------------------------------------------- Study data:  Comparison was made to the study of 04/04/2015.  Study status:  Routine.  Procedure:  The patient reported no pain pre or post test. Transthoracic echocardiography for left ventricular function evaluation and for assessment of valvular function. Image quality was adequate.  Study completion:  There were no complications.          Transthoracic echocardiography.  M-mode, complete 2D, spectral Doppler, and color Doppler.  Birthdate: Patient birthdate:  1951-03-02.  Age:  Patient is 66 yr old.  Sex: Gender: female.    BMI: 36.9 kg/m^2.  Blood pressure:     130/70 Patient status:  Outpatient.  Study date:  Study date: 07/05/2016. Study time: 03:09 PM.  Location:  [facility]  -------------------------------------------------------------------  ------------------------------------------------------------------- Left ventricle:  The cavity size was normal. Wall thickness was increased in a pattern of moderate LVH. Systolic function was normal. The estimated ejection fraction was in the range of 55% to 60%. Wall motion was normal; there were no regional wall motion abnormalities. Doppler parameters are consistent with abnormal left ventricular relaxation (grade 1 diastolic dysfunction).  ------------------------------------------------------------------- Aortic valve:  Likely bicuspid valve severe stenosis now previosu mean gradient 35 mmHg has progressed  Functionally bicuspid; severely thickened, severely calcified leaflets. Mobility was not restricted.  Doppler:   There was severe stenosis.   There was no regurgitation.    VTI ratio of LVOT to aortic valve: 0.22. Valve area (VTI): 0.68 cm^2. Indexed valve area (VTI): 0.31 cm^2/m^2. Peak velocity ratio of LVOT to aortic valve: 0.18. Valve area (Vmax): 0.56 cm^2. Indexed valve area (Vmax): 0.25 cm^2/m^2. Mean velocity ratio of LVOT to aortic valve: 0.18. Valve area (Vmean): 0.55 cm^2. Indexed valve area (Vmean): 0.25 cm^2/m^2.    Mean gradient (S): 43 mm Hg. Peak gradient (S): 76 mm Hg.  ------------------------------------------------------------------- Aorta:  Aortic root: The aortic root was normal in size.  ------------------------------------------------------------------- Mitral valve:   Structurally normal valve.   Mobility was not restricted.  Doppler:  Transvalvular velocity was within the normal range. There was no evidence for stenosis. There was  trivial regurgitation.    Peak gradient (D): 2 mm Hg.  ------------------------------------------------------------------- Left atrium:  The atrium was normal in size.  ------------------------------------------------------------------- Atrial septum:  No defect or patent foramen ovale was identified.   ------------------------------------------------------------------- Right ventricle:  The cavity size was normal. Wall thickness was normal. Systolic function was normal.  ------------------------------------------------------------------- Pulmonic valve:    Doppler:  Transvalvular velocity was within the normal range. There was no evidence for stenosis. There was trivial regurgitation.  ------------------------------------------------------------------- Tricuspid valve:   Structurally normal valve.    Doppler: Transvalvular velocity was within the normal range. There was mild regurgitation.  ------------------------------------------------------------------- Pulmonary artery:   The main pulmonary artery was normal-sized. Systolic pressure was within the normal range.  ------------------------------------------------------------------- Right atrium:  The atrium was normal in size.  ------------------------------------------------------------------- Pericardium:  The pericardium was normal in appearance. There was no pericardial effusion.  ------------------------------------------------------------------- Systemic veins: Inferior vena cava: The vessel was normal in size. The respirophasic diameter changes were in the normal range (= 50%), consistent with normal central venous pressure.  ------------------------------------------------------------------- Measurements   Left ventricle                            Value          Reference  LV ID, ED, PLAX chordal           (L)     36    mm       43 - 52  LV ID, ES, PLAX chordal                   25.4  mm       23 -  38  LV fx shortening, PLAX chordal            29    %        >=29  LV PW thickness, ED                       10.6  mm       ---------  IVS/LV PW ratio, ED               (H)     1.41           <=1.3  Stroke volume, 2D                         64    ml       ---------  Stroke volume/bsa, 2D                     29    ml/m^2   ---------  LV ejection fraction, 1-p A4C             62    %        ---------  LV end-diastolic volume, 2-p              89    ml       ---------  LV end-systolic volume, 2-p               32    ml       ---------  LV ejection fraction, 2-p                 64    %        ---------  Stroke volume, 2-p  57    ml       ---------  LV end-diastolic volume/bsa, 2-p          40    ml/m^2   ---------  LV end-systolic volume/bsa, 2-p           14    ml/m^2   ---------  Stroke volume/bsa, 2-p                    25.7  ml/m^2   ---------  LV e&', lateral                            5.07  cm/s     ---------  LV E/e&', lateral                          14.5           ---------  LV e&', medial                             4.48  cm/s     ---------  LV E/e&', medial                           16.41          ---------  LV e&', average                            4.78  cm/s     ---------  LV E/e&', average                          15.39          ---------    Ventricular septum                        Value          Reference  IVS thickness, ED                         14.9  mm       ---------    LVOT                                      Value          Reference  LVOT ID, S                                20    mm       ---------  LVOT area                                 3.14  cm^2     ---------  LVOT ID                                   20    mm       ---------  LVOT peak velocity, S  78.2  cm/s     ---------  LVOT mean velocity, S                     54.6  cm/s     ---------  LVOT VTI, S                               20.5  cm       ---------  LVOT peak  gradient, S                     2     mm Hg    ---------  Stroke volume (SV), LVOT DP               64.4  ml       ---------  Stroke index (SV/bsa), LVOT DP            29    ml/m^2   ---------    Aortic valve                              Value          Reference  Aortic valve peak velocity, S             437   cm/s     ---------  Aortic valve mean velocity, S             309   cm/s     ---------  Aortic valve VTI, S                       94    cm       ---------  Aortic mean gradient, S                   43    mm Hg    ---------  Aortic peak gradient, S                   76    mm Hg    ---------  VTI ratio, LVOT/AV                        0.22           ---------  Aortic valve area, VTI                    0.68  cm^2     ---------  Aortic valve area/bsa, VTI                0.31  cm^2/m^2 ---------  Velocity ratio, peak, LVOT/AV             0.18           ---------  Aortic valve area, peak velocity          0.56  cm^2     ---------  Aortic valve area/bsa, peak               0.25  cm^2/m^2 ---------  velocity  Velocity ratio, mean, LVOT/AV             0.18           ---------  Aortic valve area, mean velocity          0.55  cm^2     ---------  Aortic valve area/bsa, mean               0.25  cm^2/m^2 ---------  velocity    Aorta                                     Value          Reference  Aortic root ID, ED                        32    mm       ---------  Ascending aorta ID, A-P, S                35    mm       ---------    Left atrium                               Value          Reference  LA ID, A-P, ES                            37    mm       ---------  LA ID/bsa, A-P                            1.67  cm/m^2   <=2.2  LA volume, S                              55    ml       ---------  LA volume/bsa, S                          24.8  ml/m^2   ---------  LA volume, ES, 1-p A4C                    43    ml       ---------  LA volume/bsa, ES, 1-p A4C                19.4  ml/m^2   ---------   LA volume, ES, 1-p A2C                    66    ml       ---------  LA volume/bsa, ES, 1-p A2C                29.8  ml/m^2   ---------    Mitral valve                              Value          Reference  Mitral E-wave peak velocity               73.5  cm/s     ---------  Mitral A-wave peak velocity               94.3  cm/s     ---------  Mitral deceleration time          (H)  352   ms       150 - 230  Mitral peak gradient, D                   2     mm Hg    ---------  Mitral E/A ratio, peak                    0.8            ---------    Systemic veins                            Value          Reference  Estimated CVP                             3     mm Hg    ---------    Right ventricle                           Value          Reference  RV s&', lateral, S                         12.2  cm/s     ---------  Legend: (L)  and  (H)  mark values outside specified reference range.  ------------------------------------------------------------------- Prepared and Electronically Authenticated by  Jenkins Rouge, M.D. 2018-01-12T17:27:22    STS Risk Calculator  Procedure    AVR  Risk of Mortality   2.1% Morbidity or Mortality  13.2% Prolonged LOS   5.8% Short LOS    38.2% Permanent Stroke   0.9% Prolonged Vent Support  8.9% DSW Infection    0.4% Renal Failure    3.5% Reoperation    5.0%    Impression:  Patient has stage D severe symptomatic aortic stenosis. She presents with symptoms of chest pain that are somewhat atypical and occur both with activity and at rest but possibly consistent with angina pectoris. She also describes stable symptoms of mild exertional shortness breath consistent with chronic diastolic congestive heart failure, New York Heart Association functional class II. I have personally reviewed the patient's recent transthoracic echocardiogram. The patient's aortic valve appears to be functionally bicuspid with fusion of the left and right cusps of the valve.  There is moderate thickening, calcification, and restricted leaflet mobility involving the fused leaflets in particular. The non-coronary leaflet appears to move a little bit better. He velocity across the aortic valve was measured well above 4 m/s corresponding to mean transvalvular gradient greater than 40 mmHg. Left ventricular systolic function remains normal. I agree the patient should undergo aortic valve replacement. Risks associated with conventional surgery may be relatively low but mildly elevated because of the patient's obesity and comorbid medical problems.  She will need to undergo diagnostic cardiac catheterization to rule out the presence of significant coronary artery disease.   Plan:  The patient and her husband were counseled at length regarding treatment alternatives for management of severe aortic stenosis including continued medical therapy versus proceeding with aortic valve replacement in the near future.  The natural history of aortic stenosis was reviewed, as was long term prognosis with medical therapy alone.  Surgical options were discussed at length including conventional surgical aortic valve replacement through either a full  median sternotomy or using minimally invasive techniques.  Other alternatives including rapid-deployment bioprosthetic tissue valve replacement, transcatheter aortic valve replacement, patch enlargement of the aortic root, stentless porcine aortic root replacement, valve repair, the Ross autograft procedure, and homograft aortic root replacement were discussed.  Discussion was held comparing the relative risks of mechanical valve replacement with need for lifelong anticoagulation versus use of a bioprosthetic tissue valve and the associated potential for late structural valve deterioration and failure.   The patient is interested in proceeding with further diagnostic testing and elective aortic valve replacement in the near future. She will need to undergo  left and right heart catheterization. In addition, we will obtain a cardiac gated CT angiogram of the heart to rule out the possibility of aneurysmal enlargement of the ascending aorta in the setting of bicuspid aortic valve disease and more precisely evaluate the anatomical characteristics of that aortic valve and aortic root. The patient will return to our office in approximately 2 weeks to review the results of these tests and discuss treatment options further. All of her questions have been addressed.   I spent in excess of 90 minutes during the conduct of this office consultation and >50% of this time involved direct face-to-face encounter with the patient for counseling and/or coordination of their care.    Valentina Gu. Roxy Manns, MD 07/16/2016 12:07 PM

## 2016-07-16 NOTE — Patient Instructions (Signed)
Continue all previous medications without any changes at this time  

## 2016-07-18 ENCOUNTER — Other Ambulatory Visit: Payer: Self-pay | Admitting: *Deleted

## 2016-07-18 DIAGNOSIS — I350 Nonrheumatic aortic (valve) stenosis: Secondary | ICD-10-CM

## 2016-07-22 ENCOUNTER — Encounter: Payer: Self-pay | Admitting: Family Medicine

## 2016-07-22 ENCOUNTER — Ambulatory Visit (INDEPENDENT_AMBULATORY_CARE_PROVIDER_SITE_OTHER): Payer: Medicare Other | Admitting: Family Medicine

## 2016-07-22 VITALS — BP 156/94 | HR 90 | Temp 98.8°F | Ht 65.5 in | Wt 225.2 lb

## 2016-07-22 DIAGNOSIS — J0100 Acute maxillary sinusitis, unspecified: Secondary | ICD-10-CM

## 2016-07-22 MED ORDER — AMOXICILLIN-POT CLAVULANATE 875-125 MG PO TABS: 1.0000 | ORAL_TABLET | Freq: Two times a day (BID) | ORAL | 0 refills | Status: DC

## 2016-07-22 NOTE — Progress Notes (Signed)
PCP: Garret Reddish, MD  Subjective:  [name] is a 66 y.o. year old very pleasant female patient who presents with sinusitis symptoms including nasal congestion, sinus tenderness -other symptoms include: cough, fatigue, congestion in ears -day of illness:4 -Symptoms are worsening -previous treatments: mucinex dm, allegra D -sick contacts/travel/risks: denies flu exposure.  Hx of allergites  ROS-denies fever, SOB, NVD, tooth pain  Pertinent Past Medical History-  Patient Active Problem List   Diagnosis Date Noted  . Diabetes mellitus type 2, controlled ([place]) 05/12/2014    Priority: High  . OSA (obstructive sleep apnea) 02/27/2016    Priority: Medium  . Elevated alkaline phosphatase level 05/16/2015    Priority: Medium  . GERD (gastroesophageal reflux disease) 03/10/2014    Priority: Medium  . Generalized anxiety disorder 03/10/2014    Priority: Medium  . Migraines 03/10/2014    Priority: Medium  . Irritable bowel syndrome 12/15/2008    Priority: Medium  . OBESITY, MORBID 10/12/2008    Priority: Medium  . Hyperlipidemia 02/09/2008    Priority: Medium  . Bruxism 10/23/2015    Priority: Low  . Atypical chest pain 11/10/2014    Priority: Low  . Acne 05/12/2014    Priority: Low  . EUSTACHIAN TUBE DYSFUNCTION, CHRONIC 06/14/2010    Priority: Low  . Low back pain 08/11/2008    Priority: Low  . Allergic rhinitis 02/12/2007    Priority: Low  . ASTHMA 02/12/2007    Priority: Low  . Severe aortic stenosis 07/08/2016  . Osteopenia 05/27/2016    Medications- reviewed  Current Outpatient Prescriptions  Medication Sig Dispense Refill  . aspirin 81 MG tablet Take 1 tablet (81 mg total) by mouth daily. 30 tablet 0  . atorvastatin (LIPITOR) 40 MG tablet TAKE 1 TABLET BY MOUTH AT NIGHT ON TUESDAY AND FRIDAY 30 tablet 5  . Blood Glucose Monitoring Suppl (ONETOUCH VERIO) W/DEVICE KIT 1 kit by Does not apply route once. 1 kit 0  . calcium-vitamin D (OSCAL WITH D)  500-200 MG-UNIT per tablet Take 1 tablet by mouth daily.    [name] dextromethorphan-guaiFENesin (MUCINEX DM) 30-600 MG 12hr tablet Take 1 tablet by mouth 2 (two) times daily as needed for cough.    . fluticasone (FLONASE) 50 MCG/ACT nasal spray     . glucose blood (ONETOUCH VERIO) test strip Use to test blood sugars daily. Dx: E11.9 100 each 12  . ipratropium (ATROVENT) 0.03 % nasal spray Place 2 sprays into both nostrils 3 (three) times daily as needed.  3  . KLS ALLERCLEAR D-24HR 10-240 MG 24 hr tablet TAKE 1 TABLET BY MOUTH DAILY. 30 tablet 11  . Lancets (ONETOUCH ULTRASOFT) lancets Use to test blood sugars daily. Dx: E11.9 100 each 12  . metFORMIN (GLUCOPHAGE) 500 MG tablet TAKE 1 TABLET WITH BREAKFAST AND 1/2 TO 1 TABLET WITH DINNER. 60 tablet 5  . metoCLOPramide (REGLAN) 10 MG tablet TAKE 1/2 TABLET BY MOUTH 4 TIMES A DAY 60 tablet 5  . nabumetone (RELAFEN) 750 MG tablet TAKE 2 TABLETS BY MOUTH EVERY DAY 60 tablet 5  . omeprazole (PRILOSEC) 40 MG capsule Take 1 capsule (40 mg total) by mouth daily. 30 capsule 11  . Probiotic Product (ALIGN) 4 MG CAPS Take by mouth.    . psyllium (METAMUCIL SMOOTH TEXTURE) 28 % packet Take 1 packet by mouth 2 (two) times daily.     . SUMAtriptan (IMITREX) 100 MG tablet TAKE 1 TABLET BY MOUTH EVERY 2 HOURS AS NEEDED FOR MIGRAINE 10 tablet  3  . venlafaxine XR (EFFEXOR-XR) 150 MG 24 hr capsule TAKE ONE CAPSULE BY MOUTH EVERY DAY WITH BREAKFAST 30 capsule 5   No current facility-administered medications for this visit.     Objective: BP (!) 156/94 (BP Location: Left Arm, Patient Position: Sitting, Cuff Size: Large)   Pulse 90   Temp 98.8 F (37.1 C) (Oral)   Ht 5' 5.5" (1.664 m)   Wt 225 lb 3.2 oz (102.2 kg)   SpO2 97%   BMI 36.91 kg/m  Gen: NAD, resting comfortably HEENT: Turbinates erythematous with yellow drainage, TM normal, pharynx mildly erythematous with no tonsilar exudate or edema, bilateral maxillary sinus tenderness CV: RRR systolic ejection  murmur Lungs: CTAB no crackles, wheeze, rhonchi  Ext: no edema Skin: warm, dry, no rash Neuro: grossly normal, moves all extremities  Assessment/Plan:  Sinsusitis viral based on <10 days, no double sickening, lack of severity of symptoms in first 3 days. Educated on signs that bacterial infection may have developed (symptoms over 10 days, double sickening). She will have augmentin on hand if either of these happen. Blood pressure higher so stop decongestant in her claritin D- get plain claritin instead  Treatment: -considered steroid: we opted out for now due to diabetes -other symptomatic care with mucinex- DM  Finally, we reviewed reasons to return to care including if symptoms worsen or persist or new concerns arise (particularly fever or shortness of breath)  Meds ordered this encounter  . amoxicillin-clavulanate (AUGMENTIN) 875-125 MG tablet    Sig: Take 1 tablet by mouth 2 (two) times daily.    Dispense:  14 tablet    Refill:  0    Garret Reddish, MD

## 2016-07-22 NOTE — Patient Instructions (Signed)
Sinsusitis iral based on <10 days, no double sickening, lack of severity of symptoms in first 3 days. Educated on signs that bacterial infection may have developed (symptoms over 10 days, double sickening). She will have augmentin on hand if either of these happen. Blood pressure higher so stop decongestant- get plain claritin instead  Treatment: -considered steroid: we opted out for now due to diabetes -other symptomatic care with mucinex- DM -Antibiotic indicated: not yet  Finally, we reviewed reasons to return to care including if symptoms worsen or persist or new concerns arise (particularly fever or shortness of breath)  Meds ordered this encounter  . amoxicillin-clavulanate (AUGMENTIN) 875-125 MG tablet    Sig: Take 1 tablet by mouth 2 (two) times daily.    Dispense:  14 tablet    Refill:  0

## 2016-07-22 NOTE — Progress Notes (Signed)
Pre visit review using our clinic review tool, if applicable. No additional management support is needed unless otherwise documented below in the visit note. 

## 2016-07-25 ENCOUNTER — Encounter: Payer: Self-pay | Admitting: Family Medicine

## 2016-07-25 ENCOUNTER — Telehealth: Payer: Self-pay | Admitting: Family Medicine

## 2016-07-25 ENCOUNTER — Other Ambulatory Visit: Payer: Self-pay | Admitting: Cardiovascular Disease

## 2016-07-25 ENCOUNTER — Ambulatory Visit (HOSPITAL_COMMUNITY)
Admission: RE | Admit: 2016-07-25 | Discharge: 2016-07-25 | Disposition: A | Discharge status: Home / Self Care | Payer: Medicare Other | Source: Ambulatory Visit | Attending: Thoracic Surgery (Cardiothoracic Vascular Surgery) | Admitting: Thoracic Surgery (Cardiothoracic Vascular Surgery)

## 2016-07-25 ENCOUNTER — Encounter (HOSPITAL_COMMUNITY): Payer: Self-pay

## 2016-07-25 DIAGNOSIS — I350 Nonrheumatic aortic (valve) stenosis: Secondary | ICD-10-CM | POA: Diagnosis not present

## 2016-07-25 DIAGNOSIS — K7689 Other specified diseases of liver: Secondary | ICD-10-CM | POA: Diagnosis not present

## 2016-07-25 DIAGNOSIS — D1430 Benign neoplasm of unspecified bronchus and lung: Secondary | ICD-10-CM | POA: Insufficient documentation

## 2016-07-25 LAB — POCT I-STAT CREATININE: Creatinine, Ser: 0.7 mg/dL (ref 0.44–1.00)

## 2016-07-25 IMAGING — CT CT HEART MORP W/ CTA COR W/ SCORE W/ CA W/CM &/OR W/O CM
1 of 14 series · 1 of 19 positions shown, 2 images · non-contrast
Comparison: None.

CLINICAL DATA: 65-year-old female with bicuspid aortic valve and
severe aortic stenosis.

EXAM:
Cardiac TAVR CT
TECHNIQUE: The patient was scanned on a Philips 256 scanner. A 120 kV
retrospective scan was triggered in the descending thoracic aorta at
111 HU's. Gantry rotation speed was 270 msecs and collimation was .9
mm. 10 mg of iv Metoprolol and no nitro were given. The 3D data set
was reconstructed in 5% intervals of the R-R cycle. Systolic and
diastolic phases were analyzed on a dedicated work station using
MPR, MIP and VRT modes. The patient received 80 cc of contrast.

[Series 200: locator · axial · 0.35mm/px · z∈[-140,-140]mm · 1 of 1 slices shown, 2 images]
[im 1/1  vessel]
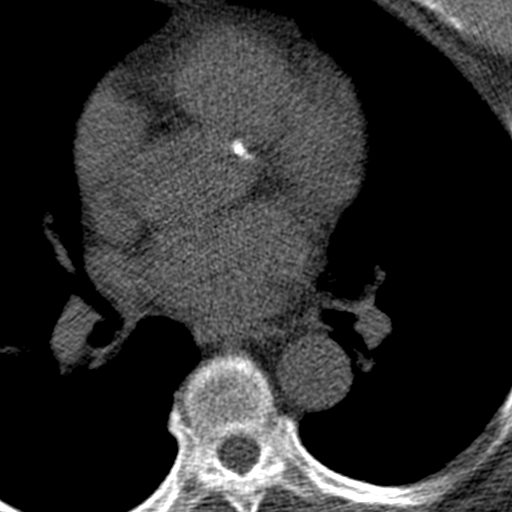
[im 1/1  lung]
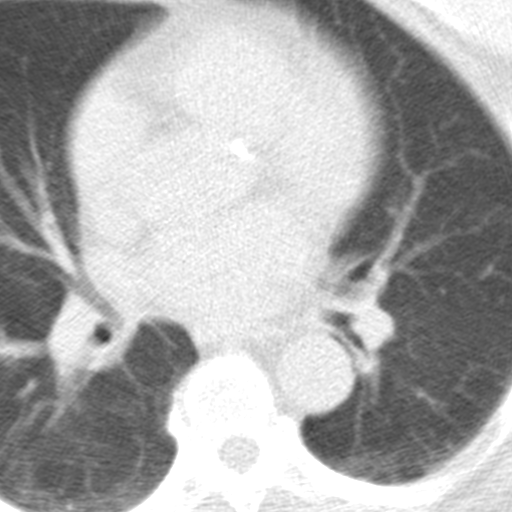

[1 of 19 positions shown; findings below may reference images not displayed]

FINDINGS: Aortic Valve: Bicuspid valve with severe thickening and
calcifications of the leaflets that have severely restricted
opening.

Aorta: Normal size of the aortic root, sinotubular junction,
ascending, descending aorta and aortic arch. There are only minimal
calcifications in the aortic arch and no dissection.

Sinus of Valsalva Measurements:  32 x 30 mm

Sinotubular Junction:  30 x 27 mm

Ascending Thoracic Aorta:  35 x 35 mm

Aortic Arch:  26 x 24 mm

Descending Thoracic Aorta:  24 x 23 mm

Coronary Artery Height above Annulus:

Left Main:  12 mm

Right Coronary:  14 mm

Coronary Arteries:  Normal origin.  Right dominance.  No NTG given.

Other findings:

No ASD/VSD seen.

Normal pulmonary vein drainage into the left atrium.

A large left atrial appendage with no thrombus.

Normal size of the pulmonary artery.
IMPRESSION: 1. Bicuspid aortic valve with severe thickening and calcifications
of the leaflets that have severely restricted opening.

2. Normal size of the aortic root, sinotubular junction, ascending,
descending aorta and aortic arch. There are only minimal
calcifications in the aortic arch and no dissection.

Klaudia Lyn

EXAM:
OVER-READ INTERPRETATION  CT CHEST

The following report is an over-read performed by radiologist Dr.
over-read does not include interpretation of cardiac or coronary
anatomy or pathology. The CTA interpretation by the cardiologist is
attached.
FINDINGS: Limited view of the lung parenchyma demonstrates a 4 mm nodular LEFT
lower lobe (image 67, series 412). Smaller 3 mm nodule in the LEFT
lower lobe on image 79.

Two subpleural nodules in the RIGHT lung measure 3 mm each on image
44, series 412.. Airways are normal.

Limited view of the mediastinum demonstrates no adenopathy.
Esophagus normal.

Limited view of the upper abdomen demonstrates benign hepatic cyst.

Limited view of the skeleton and chest wall demonstrates no
aggressive osseous lesion. Bilateral subglandular breast implants
noted.
IMPRESSION: 1. Bilateral noncalcified pulmonary nodules. Favor benign
noncalcified granulomas. Largest nodule measures 4 mm. No follow-up
needed if patient is low-risk. Non-contrast chest CT can be
considered in 12 months if patient is high-risk. This recommendation
follows the consensus statement: Guidelines for Management of
Incidental Pulmonary Nodules Detected on CT Images: From the
2. Benign hepatic cyst

## 2016-07-25 MED ORDER — IOPAMIDOL (ISOVUE-370) INJECTION 76%
INTRAVENOUS | Status: AC
  Administered 2016-07-25: 80 mL
  Filled 2016-07-25: qty 100

## 2016-07-25 MED ORDER — METOPROLOL TARTRATE 5 MG/5ML IV SOLN
INTRAVENOUS | Status: AC
  Filled 2016-07-25: qty 5

## 2016-07-25 MED ORDER — METOPROLOL TARTRATE 5 MG/5ML IV SOLN
5.0000 mg | INTRAVENOUS | Status: DC | PRN
  Administered 2016-07-25: 10:00:00 via INTRAVENOUS
  Administered 2016-07-25: 5 mg via INTRAVENOUS

## 2016-07-25 MED ORDER — DOXYCYCLINE HYCLATE 100 MG PO TABS: 100.0000 mg | ORAL_TABLET | Freq: Two times a day (BID) | ORAL | 0 refills | Status: DC

## 2016-07-25 NOTE — Telephone Encounter (Signed)
I sent in doxycycline and also listed augmentin as an allergy. mychart message

## 2016-07-25 NOTE — Telephone Encounter (Signed)
Patient Name: [name]  DOB: August 07, 1950    Initial Comment Caller is allergic to penicillin and thinks the antibiotic she was given has penicillin in it. She has broken out in rash and itchy.    Nurse Assessment      Guidelines    Guideline Title Affirmed Question Affirmed Notes       Final Disposition User   FINAL ATTEMPT MADE - message left Raphael Gibney, RN, Vanita Ingles    Comments  called primary number and left message. Will try secondary number.  called secondary number and recording states that the person I have called has a voice mailbox that has not been set up yet. Unable to leave message. will try primary number again later.

## 2016-07-25 NOTE — Telephone Encounter (Signed)
Can you check tomorrow to make sure got message

## 2016-07-25 NOTE — Discharge Instr - Diet (Signed)
Pt tolerating crackers and water without difficulty.

## 2016-07-25 NOTE — Telephone Encounter (Signed)
Spoke with pt and she states that once she started Augmentin she began to break out in rash/hives that are itching. She denies angioedema or ShOB. She does take daily allergy medication. Advised she can also take Benadryl. She would like another abx sent in that is not related to pcn, please.   Dr. Yong Channel - Please advise. Thanks!

## 2016-07-26 NOTE — Telephone Encounter (Signed)
[name], MD  to [place]   07/25/16 5:44 PM  I sent in doxycycline and also listed augmentin as an allergy. Sorry about the rash- we will know to avoid all penicillins variations at this point.   -[place] with pt and she is aware of medication change. Nothing further needed.

## 2016-07-26 NOTE — Telephone Encounter (Signed)
Spoke with patient who verbalized understanding.

## 2016-07-28 ENCOUNTER — Other Ambulatory Visit: Payer: Self-pay | Admitting: Family Medicine

## 2016-08-02 ENCOUNTER — Encounter (HOSPITAL_COMMUNITY)
Admission: RE | Disposition: A | Discharge status: Home / Self Care | Payer: Self-pay | Source: Ambulatory Visit | Attending: Cardiovascular Disease

## 2016-08-02 ENCOUNTER — Encounter (HOSPITAL_COMMUNITY): Payer: Self-pay | Admitting: Cardiovascular Disease

## 2016-08-02 ENCOUNTER — Ambulatory Visit (HOSPITAL_COMMUNITY)
Admission: RE | Admit: 2016-08-02 | Discharge: 2016-08-02 | Disposition: A | Discharge status: Home / Self Care | Payer: Medicare Other | Source: Ambulatory Visit | Attending: Cardiovascular Disease | Admitting: Cardiovascular Disease

## 2016-08-02 DIAGNOSIS — Q231 Congenital insufficiency of aortic valve: Secondary | ICD-10-CM | POA: Diagnosis not present

## 2016-08-02 DIAGNOSIS — F419 Anxiety disorder, unspecified: Secondary | ICD-10-CM | POA: Diagnosis not present

## 2016-08-02 DIAGNOSIS — I2510 Atherosclerotic heart disease of native coronary artery without angina pectoris: Secondary | ICD-10-CM | POA: Diagnosis not present

## 2016-08-02 DIAGNOSIS — Z7982 Long term (current) use of aspirin: Secondary | ICD-10-CM | POA: Diagnosis not present

## 2016-08-02 DIAGNOSIS — Z880 Allergy status to penicillin: Secondary | ICD-10-CM | POA: Insufficient documentation

## 2016-08-02 DIAGNOSIS — K3184 Gastroparesis: Secondary | ICD-10-CM | POA: Diagnosis not present

## 2016-08-02 DIAGNOSIS — E1143 Type 2 diabetes mellitus with diabetic autonomic (poly)neuropathy: Secondary | ICD-10-CM | POA: Diagnosis not present

## 2016-08-02 DIAGNOSIS — Z8249 Family history of ischemic heart disease and other diseases of the circulatory system: Secondary | ICD-10-CM | POA: Diagnosis not present

## 2016-08-02 DIAGNOSIS — E785 Hyperlipidemia, unspecified: Secondary | ICD-10-CM | POA: Diagnosis not present

## 2016-08-02 DIAGNOSIS — Z7984 Long term (current) use of oral hypoglycemic drugs: Secondary | ICD-10-CM | POA: Diagnosis not present

## 2016-08-02 DIAGNOSIS — E669 Obesity, unspecified: Secondary | ICD-10-CM | POA: Diagnosis not present

## 2016-08-02 DIAGNOSIS — Z7951 Long term (current) use of inhaled steroids: Secondary | ICD-10-CM | POA: Insufficient documentation

## 2016-08-02 DIAGNOSIS — K219 Gastro-esophageal reflux disease without esophagitis: Secondary | ICD-10-CM | POA: Insufficient documentation

## 2016-08-02 DIAGNOSIS — J45909 Unspecified asthma, uncomplicated: Secondary | ICD-10-CM | POA: Diagnosis not present

## 2016-08-02 DIAGNOSIS — Z6836 Body mass index (BMI) 36.0-36.9, adult: Secondary | ICD-10-CM | POA: Diagnosis not present

## 2016-08-02 DIAGNOSIS — M1990 Unspecified osteoarthritis, unspecified site: Secondary | ICD-10-CM | POA: Insufficient documentation

## 2016-08-02 DIAGNOSIS — G4733 Obstructive sleep apnea (adult) (pediatric): Secondary | ICD-10-CM | POA: Diagnosis not present

## 2016-08-02 DIAGNOSIS — K589 Irritable bowel syndrome without diarrhea: Secondary | ICD-10-CM | POA: Diagnosis not present

## 2016-08-02 DIAGNOSIS — I350 Nonrheumatic aortic (valve) stenosis: Secondary | ICD-10-CM

## 2016-08-02 HISTORY — PX: RIGHT/LEFT HEART CATH AND CORONARY ANGIOGRAPHY: CATH118266

## 2016-08-02 LAB — POCT I-STAT 3, ART BLOOD GAS (G3+)
Acid-base deficit: 1 mmol/L (ref 0.0–2.0)
Bicarbonate: 23.9 mmol/L (ref 20.0–28.0)
O2 Saturation: 92 %
TCO2: 25 mmol/L (ref 0–100)
pCO2 arterial: 39.6 mmHg (ref 32.0–48.0)
pH, Arterial: 7.389 (ref 7.350–7.450)
pO2, Arterial: 65 mmHg — ABNORMAL LOW (ref 83.0–108.0)

## 2016-08-02 LAB — POCT I-STAT 3, VENOUS BLOOD GAS (G3P V)
Acid-base deficit: 1 mmol/L (ref 0.0–2.0)
Bicarbonate: 24.5 mmol/L (ref 20.0–28.0)
O2 Saturation: 69 %
TCO2: 26 mmol/L (ref 0–100)
pCO2, Ven: 41.9 mmHg — ABNORMAL LOW (ref 44.0–60.0)
pH, Ven: 7.375 (ref 7.250–7.430)
pO2, Ven: 37 mmHg (ref 32.0–45.0)

## 2016-08-02 LAB — GLUCOSE, CAPILLARY
Glucose-Capillary: 130 mg/dL — ABNORMAL HIGH (ref 65–99)
Glucose-Capillary: 114 mg/dL — ABNORMAL HIGH (ref 65–99)

## 2016-08-02 LAB — CBC
HCT: 36.8 % (ref 36.0–46.0)
Hemoglobin: 12 g/dL (ref 12.0–15.0)
MCH: 27.8 pg (ref 26.0–34.0)
MCHC: 32.6 g/dL (ref 30.0–36.0)
MCV: 85.2 fL (ref 78.0–100.0)
Platelets: 308 10*3/uL (ref 150–400)
RBC: 4.32 MIL/uL (ref 3.87–5.11)
RDW: 13.8 % (ref 11.5–15.5)
WBC: 7.7 10*3/uL (ref 4.0–10.5)

## 2016-08-02 LAB — BASIC METABOLIC PANEL
Anion gap: 10 (ref 5–15)
BUN: 13 mg/dL (ref 6–20)
CO2: 25 mmol/L (ref 22–32)
Calcium: 9.1 mg/dL (ref 8.9–10.3)
Chloride: 101 mmol/L (ref 101–111)
Creatinine, Ser: 0.68 mg/dL (ref 0.44–1.00)
GFR calc Af Amer: >60 mL/min (ref >60)
GFR calc non Af Amer: >60 mL/min (ref >60)
Glucose, Bld: 132 mg/dL — ABNORMAL HIGH (ref 65–99)
Potassium: 4.1 mmol/L (ref 3.5–5.1)
Sodium: 136 mmol/L (ref 135–145)

## 2016-08-02 LAB — PROTIME-INR
INR: 1.02
Prothrombin Time: 13.4 seconds (ref 11.4–15.2)

## 2016-08-02 SURGERY — RIGHT/LEFT HEART CATH AND CORONARY ANGIOGRAPHY

## 2016-08-02 MED ORDER — ACETAMINOPHEN 325 MG PO TABS: 650.0000 mg | ORAL_TABLET | ORAL | Status: DC | PRN

## 2016-08-02 MED ORDER — SODIUM CHLORIDE 0.9 % WEIGHT BASED INFUSION: 1.0000 mL/kg/h | INTRAVENOUS | Status: DC

## 2016-08-02 MED ORDER — HEPARIN SODIUM (PORCINE) 1000 UNIT/ML IJ SOLN
INTRAMUSCULAR | Status: AC
  Filled 2016-08-02: qty 1

## 2016-08-02 MED ORDER — SODIUM CHLORIDE 0.9% FLUSH: 3.0000 mL | Freq: Two times a day (BID) | INTRAVENOUS | Status: DC

## 2016-08-02 MED ORDER — IOPAMIDOL (ISOVUE-370) INJECTION 76%
INTRAVENOUS | Status: DC | PRN
  Administered 2016-08-02: 50 mL via INTRA_ARTERIAL

## 2016-08-02 MED ORDER — FENTANYL CITRATE (PF) 100 MCG/2ML IJ SOLN
INTRAMUSCULAR | Status: DC | PRN
  Administered 2016-08-02: 25 ug via INTRAVENOUS

## 2016-08-02 MED ORDER — SODIUM CHLORIDE 0.9 % IV SOLN: 250.0000 mL | INTRAVENOUS | Status: DC | PRN

## 2016-08-02 MED ORDER — IOPAMIDOL (ISOVUE-370) INJECTION 76%
INTRAVENOUS | Status: AC
  Filled 2016-08-02: qty 100

## 2016-08-02 MED ORDER — HEPARIN (PORCINE) IN NACL 2-0.9 UNIT/ML-% IJ SOLN
INTRAMUSCULAR | Status: AC
  Filled 2016-08-02: qty 1000

## 2016-08-02 MED ORDER — VERAPAMIL HCL 2.5 MG/ML IV SOLN
INTRAVENOUS | Status: DC | PRN
  Administered 2016-08-02: 10 mL via INTRA_ARTERIAL

## 2016-08-02 MED ORDER — HEPARIN (PORCINE) IN NACL 2-0.9 UNIT/ML-% IJ SOLN
INTRAMUSCULAR | Status: DC | PRN
  Administered 2016-08-02: 1000 mL

## 2016-08-02 MED ORDER — ONDANSETRON HCL 4 MG/2ML IJ SOLN: 4.0000 mg | Freq: Four times a day (QID) | INTRAMUSCULAR | Status: DC | PRN

## 2016-08-02 MED ORDER — SODIUM CHLORIDE 0.9% FLUSH
3.0000 mL | INTRAVENOUS | Status: DC | PRN
Start: 2016-08-02 — End: 2016-08-02

## 2016-08-02 MED ORDER — VERAPAMIL HCL 2.5 MG/ML IV SOLN
INTRAVENOUS | Status: AC
  Filled 2016-08-02: qty 2

## 2016-08-02 MED ORDER — MIDAZOLAM HCL 2 MG/2ML IJ SOLN
INTRAMUSCULAR | Status: DC | PRN
  Administered 2016-08-02: 2 mg via INTRAVENOUS

## 2016-08-02 MED ORDER — MIDAZOLAM HCL 2 MG/2ML IJ SOLN
INTRAMUSCULAR | Status: AC
  Filled 2016-08-02: qty 2

## 2016-08-02 MED ORDER — LIDOCAINE HCL (PF) 1 % IJ SOLN
INTRAMUSCULAR | Status: AC
  Filled 2016-08-02: qty 30

## 2016-08-02 MED ORDER — LIDOCAINE HCL (PF) 1 % IJ SOLN
INTRAMUSCULAR | Status: DC | PRN
  Administered 2016-08-02 (×2): 2 mL

## 2016-08-02 MED ORDER — FENTANYL CITRATE (PF) 100 MCG/2ML IJ SOLN
INTRAMUSCULAR | Status: AC
  Filled 2016-08-02: qty 2

## 2016-08-02 MED ORDER — HEPARIN SODIUM (PORCINE) 1000 UNIT/ML IJ SOLN
INTRAMUSCULAR | Status: DC | PRN
  Administered 2016-08-02: 5000 [IU] via INTRAVENOUS

## 2016-08-02 MED ORDER — SODIUM CHLORIDE 0.9% FLUSH: 3.0000 mL | INTRAVENOUS | Status: DC | PRN

## 2016-08-02 MED ORDER — SODIUM CHLORIDE 0.9 % WEIGHT BASED INFUSION
3.0000 mL/kg/h | INTRAVENOUS | Status: DC
  Administered 2016-08-02: 3 mL/kg/h via INTRAVENOUS

## 2016-08-02 SURGICAL SUPPLY — 12 items
CATH BALLN WEDGE 5F 110CM (CATHETERS) ×1 IMPLANT
CATH EXPO 5F FL3.5 (CATHETERS) ×1 IMPLANT
CATH EXPO 5FR FR4 (CATHETERS) ×1 IMPLANT
DEVICE RAD COMP TR BAND LRG (VASCULAR PRODUCTS) ×1 IMPLANT
GLIDESHEATH SLEND SS 6F .021 (SHEATH) ×1 IMPLANT
GUIDEWIRE INQWIRE 1.5J.035X260 (WIRE) IMPLANT
INQWIRE 1.5J .035X260CM (WIRE) ×2
KIT HEART LEFT (KITS) ×2 IMPLANT
PACK CARDIAC CATHETERIZATION (CUSTOM PROCEDURE TRAY) ×2 IMPLANT
SHEATH FAST CATH BRACH 5F 5CM (SHEATH) ×1 IMPLANT
TRANSDUCER W/STOPCOCK (MISCELLANEOUS) ×2 IMPLANT
TUBING CIL FLEX 10 FLL-RA (TUBING) ×2 IMPLANT

## 2016-08-02 NOTE — Discharge Instructions (Signed)

## 2016-08-02 NOTE — H&P (View-Only) (Signed)
[address]       [place] [address]             [phone]     CARDIOTHORACIC SURGERY CONSULTATION REPORT  Referring Provider is Dorothy Spark, MD PCP is Garret Reddish, MD  Chief Complaint  Patient presents with  . Aortic Stenosis    Surgical eval, ECHO 07/05/16    HPI:  Patient is a 66 year old obese female with history of aortic stenosis, atypical chest pain, obstructive sleep apnea on CPAP, GE reflux disease, and type 2 diabetes mellitus who has been referred for surgical consultation to discuss treatment options for management of severe symptomatic aortic stenosis. The patient has been obese for the majority of her adult life. She was followed for many years by Dr. Arnoldo Morale but more recently has been followed by Dr. Yong Channel subsequent to Dr. Arnoldo Morale retirement. Dr. Yong Channel noted a systolic murmur on physical exam and transthoracic echocardiogram performed in 2015 revealed normal left ventricular systolic function with moderate aortic stenosis, mean transvalvular gradient estimated 30 mmHg.. The patient began to experience symptoms of chest pain and was referred to Dr. Meda Coffee for cardiology consultation in 2016.  Nuclear stress test was performed and felt to be low risk with ejection fraction measured 60%.  The patient was started on low-dose aspirin and has been followed ever since by Dr. Meda Coffee.  She was seen in follow-up recently and noted to complain of progressive symptoms of exertional shortness of breath with worsening fatigue. Follow-up echocardiogram was performed 07/05/2016 revealed significant progression of aortic stenosis. Left ventricular function remained normal with ejection fraction estimated 55-60%. Peak velocity across the aortic valve was measured as high as 4.4 m/s corresponding to mean transvalvular gradient estimated 43 mmHg. The patient was referred for surgical consultation.  The patient is married and lives in [place] close to  [place] with her husband. 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 She lives a somewhat sedentary lifestyle although she does make an effort to go to the gym intermittently where she performs some low level aerobic exercises and uses weights. She also walks her dogs on a regular basis. She describes stable symptoms of exertional shortness of breath if she tries to push herself. She also has long history of chest pain and chest tightness that had features that are both consistent and atypical for angina pectoris. Symptoms may be brought on with activity but occasionally occur with rest. The patient has recently had some mild dizzy spells without syncope. She denies any resting shortness of breath, PND, orthopnea, or lower extremity edema. She has history of sleep apnea and recently started using CPAP at night.  Past Medical History:  Diagnosis Date  . Allergy   . Anxiety   . Arthritis    back & knee  . Asthma    /w resp. upset   . Colon polyps   . Diabetes mellitus without complication ([facility] [place])   . Gastric polyps   . Gastroparesis   . GERD (gastroesophageal reflux disease)   . History of migraine headaches   . Hyperlipidemia   . IBS (irritable bowel syndrome)   . Lumbar disc disease     Past Surgical History:  Procedure Laterality Date  . ABDOMINAL HYSTERECTOMY    . BLADDER SUSPENSION  2007  . BREAST ENHANCEMENT SURGERY  1980  . CARPAL TUNNEL RELEASE Bilateral   .  DORSAL COMPARTMENT RELEASE Right 09/15/2014   Procedure: RIGHT WRIST DEQUERVAINS RELEASE ;  Surgeon: Kathryne Hitch, MD;  Location: [facility];  Service: Orthopedics;  Laterality: Right;  . ESOPHAGOGASTRODUODENOSCOPY    . KNEE ARTHROSCOPY  04/15/2012   Procedure: ARTHROSCOPY KNEE;  Surgeon: [name], MD;  Location: [facility];  Service: Orthopedics;  Laterality: Right;  . LAPAROSCOPIC CHOLECYSTECTOMY    . LASIK     . OOPHORECTOMY    . PALATE TO GINGIVA GRAFT  2017  . ROTATOR CUFF REPAIR Left   . TOTAL KNEE ARTHROPLASTY  04/15/2012   Procedure: TOTAL KNEE ARTHROPLASTY;  Surgeon: [name], MD;  Location: [facility];  Service: Orthopedics;  Laterality: Right;  . TRIGGER FINGER RELEASE Right 04/20/2015   Procedure: RIGHT TRIGGER FINGER RELEASE (TENDON SHEALTH INCISION) ;  Surgeon: [name], MD;  Location: [facility];  Service: Orthopedics;  Laterality: Right;    Family History  Problem Relation Age of Onset  . COPD Mother   . Colon polyps Mother   . Irritable bowel syndrome Mother   . Heart disease Father   . Alcohol abuse Father   . Colon polyps Maternal Aunt     Social History   Social History  . Marital status: Married    Spouse name: N/A  . Number of children: 1  . Years of education: N/A   Occupational History  . Retired Retired   Social History Main Topics  . Smoking status: Never Smoker  . Smokeless tobacco: Never Used  . Alcohol use No  . Drug use: No  . Sexual activity: Yes   Other Topics Concern  . Not on file   Social History Narrative   She lives with husband (1978) and two dogs. Step-son [name] (1971-psychiatrist in [place]). No grandkids. 2 dogs-sheltie/collie mix and border collie mix      Highest level of education:  Master in education   She is retired Animal nutritionist x 32 years.      Hobbies: time with dogs             Current Outpatient Prescriptions  Medication Sig Dispense Refill  . aspirin 81 MG tablet Take 1 tablet (81 mg total) by mouth daily. 30 tablet 0  . atorvastatin (LIPITOR) 40 MG tablet TAKE 1 TABLET BY MOUTH AT NIGHT ON TUESDAY AND FRIDAY 30 tablet 5  . Blood Glucose Monitoring Suppl (ONETOUCH VERIO) W/DEVICE KIT 1 kit by Does not apply route once. 1 kit 0  . calcium-vitamin D (OSCAL WITH D) 500-200 MG-UNIT per tablet Take 1 tablet by mouth daily.    . fluticasone (FLONASE) 50 MCG/ACT nasal spray     . glucose  blood (ONETOUCH VERIO) test strip Use to test blood sugars daily. Dx: E11.9 100 each 12  . ipratropium (ATROVENT) 0.03 % nasal spray Place 2 sprays into both nostrils 3 (three) times daily as needed.  3  . KLS ALLERCLEAR D-24HR 10-240 MG 24 hr tablet TAKE 1 TABLET BY MOUTH DAILY. 30 tablet 11  . Lancets (ONETOUCH ULTRASOFT) lancets Use to test blood sugars daily. Dx: E11.9 100 each 12  . metFORMIN (GLUCOPHAGE) 500 MG tablet TAKE 1 TABLET WITH BREAKFAST AND 1/2 TO 1 TABLET WITH DINNER. 60 tablet 5  . metoCLOPramide (REGLAN) 10 MG tablet TAKE 1/2 TABLET BY MOUTH 4 TIMES A DAY 60 tablet 5  . nabumetone (RELAFEN) 750 MG tablet TAKE 2 TABLETS BY MOUTH EVERY DAY 60 tablet 5  .  omeprazole (PRILOSEC) 40 MG capsule Take 1 capsule (40 mg total) by mouth daily. 30 capsule 11  . Probiotic Product (ALIGN) 4 MG CAPS Take by mouth.    . psyllium (METAMUCIL SMOOTH TEXTURE) 28 % packet Take 1 packet by mouth 2 (two) times daily.     . SUMAtriptan (IMITREX) 100 MG tablet TAKE 1 TABLET BY MOUTH EVERY 2 HOURS AS NEEDED FOR MIGRAINE 10 tablet 3  . venlafaxine XR (EFFEXOR-XR) 150 MG 24 hr capsule TAKE ONE CAPSULE BY MOUTH EVERY DAY WITH BREAKFAST 30 capsule 5   No current facility-administered medications for this visit.     Allergies  Allergen Reactions  . Erythromycin Nausea Only  . Penicillins Rash      Review of Systems:   General:  normal appetite, decreased energy, no weight gain, no weight loss, no fever  Cardiac:  + chest pain with exertion, + chest pain at rest, + SOB with exertion, no resting SOB, no PND, no orthopnea, no palpitations, no arrhythmia, no atrial fibrillation, no LE edema, + dizzy spells, no syncope  Respiratory:  + shortness of breath, no home oxygen, no productive cough, no dry cough, no bronchitis, no wheezing, no hemoptysis, + asthma, no pain with inspiration or cough, + sleep apnea, + CPAP at night  GI:   no difficulty swallowing, + reflux, + frequent heartburn, no hiatal hernia,  no abdominal pain, no constipation, no diarrhea, no hematochezia, no hematemesis, no melena  GU:   no dysuria,  no frequency, no urinary tract infection, no hematuria, no kidney stones, no kidney disease  Vascular:  no pain suggestive of claudication, no pain in feet, no leg cramps, no varicose veins, no DVT, no non-healing foot ulcer  Neuro:   no stroke, no TIA's, no seizures, no headaches, no temporary blindness one eye,  no slurred speech, no peripheral neuropathy, no chronic pain, no instability of gait, no memory/cognitive dysfunction  Musculoskeletal: + arthritis, no joint swelling, no myalgias, mild difficulty walking, normal mobility   Skin:   no rash, no itching, no skin infections, no pressure sores or ulcerations  Psych:   + anxiety, no depression, no nervousness, no unusual recent stress  Eyes:   no blurry vision, + floaters, no recent vision changes, + wears glasses or contacts  ENT:   + hearing loss, no loose or painful teeth, no dentures, last saw dentist Oct 2017  Hematologic:  no easy bruising, no abnormal bleeding, no clotting disorder, no frequent epistaxis  Endocrine:  + diabetes, does not check CBG's at home     Physical Exam:   BP (!) 160/90   Pulse 85   Resp 20   Ht 5' 5.5" (1.664 m)   Wt 224 lb (101.6 kg)   SpO2 95% Comment: RA  BMI 36.71 kg/m   General:  Obese, o/w  well-appearing  HEENT:  Unremarkable   Neck:   no JVD, no bruits, no adenopathy   Chest:   clear to auscultation, symmetrical breath sounds, no wheezes, no rhonchi   CV:   RRR, grade II-III/VI systolic murmur best RUSB  Abdomen:  soft, non-tender, no masses   Extremities:  warm, well-perfused, pulses diminished, no LE edema  Rectal/GU  Deferred  Neuro:   Grossly non-focal and symmetrical throughout  Skin:   Clean and dry, no rashes, no breakdown   Diagnostic Tests:  Transthoracic Echocardiography  Patient:    [name], [name] MR #:       [id number] Study Date: 07/05/2016 Gender:  F Age:        69 Height:     166.4 cm Weight:     102.1 kg BSA:        2.22 m^2 Pt. Status: Room:   SONOGRAPHER  [place], Will  ATTENDING    Ena Dawley, M.D.  ORDERING     Ena Dawley, M.D.  REFERRING    Ena Dawley, M.D.  PERFORMING   [facility], Outpatient  cc:  ------------------------------------------------------------------- LV EF: 55% -   60%  ------------------------------------------------------------------- Indications:      (I35.0).  ------------------------------------------------------------------- History:   PMH:  Acquired from the patient and from the patient&'s chart.  Chest pain.  Aortic stenosis.  Risk factors:  Diabetes mellitus. Obese. Dyslipidemia.  ------------------------------------------------------------------- Study Conclusions  - Left ventricle: The cavity size was normal. Wall thickness was   increased in a pattern of moderate LVH. Systolic function was   normal. The estimated ejection fraction was in the range of 55%   to 60%. Wall motion was normal; there were no regional wall   motion abnormalities. Doppler parameters are consistent with   abnormal left ventricular relaxation (grade 1 diastolic   dysfunction). - Aortic valve: Functionally bicuspid; severely thickened, severely   calcified leaflets. There was severe stenosis. - Atrial septum: No defect or patent foramen ovale was identified.  ------------------------------------------------------------------- Study data:  Comparison was made to the study of 04/04/2015.  Study status:  Routine.  Procedure:  The patient reported no pain pre or post test. Transthoracic echocardiography for left ventricular function evaluation and for assessment of valvular function. Image quality was adequate.  Study completion:  There were no complications.          Transthoracic echocardiography.  M-mode, complete 2D, spectral Doppler, and color Doppler.  Birthdate: Patient birthdate:  1951-03-02.  Age:  Patient is 66 yr old.  Sex: Gender: female.    BMI: 36.9 kg/m^2.  Blood pressure:     130/70 Patient status:  Outpatient.  Study date:  Study date: 07/05/2016. Study time: 03:09 PM.  Location:  [facility]  -------------------------------------------------------------------  ------------------------------------------------------------------- Left ventricle:  The cavity size was normal. Wall thickness was increased in a pattern of moderate LVH. Systolic function was normal. The estimated ejection fraction was in the range of 55% to 60%. Wall motion was normal; there were no regional wall motion abnormalities. Doppler parameters are consistent with abnormal left ventricular relaxation (grade 1 diastolic dysfunction).  ------------------------------------------------------------------- Aortic valve:  Likely bicuspid valve severe stenosis now previosu mean gradient 35 mmHg has progressed  Functionally bicuspid; severely thickened, severely calcified leaflets. Mobility was not restricted.  Doppler:   There was severe stenosis.   There was no regurgitation.    VTI ratio of LVOT to aortic valve: 0.22. Valve area (VTI): 0.68 cm^2. Indexed valve area (VTI): 0.31 cm^2/m^2. Peak velocity ratio of LVOT to aortic valve: 0.18. Valve area (Vmax): 0.56 cm^2. Indexed valve area (Vmax): 0.25 cm^2/m^2. Mean velocity ratio of LVOT to aortic valve: 0.18. Valve area (Vmean): 0.55 cm^2. Indexed valve area (Vmean): 0.25 cm^2/m^2.    Mean gradient (S): 43 mm Hg. Peak gradient (S): 76 mm Hg.  ------------------------------------------------------------------- Aorta:  Aortic root: The aortic root was normal in size.  ------------------------------------------------------------------- Mitral valve:   Structurally normal valve.   Mobility was not restricted.  Doppler:  Transvalvular velocity was within the normal range. There was no evidence for stenosis. There was  trivial regurgitation.    Peak gradient (D): 2 mm Hg.  ------------------------------------------------------------------- Left atrium:  The atrium was normal in size.  ------------------------------------------------------------------- Atrial septum:  No defect or patent foramen ovale was identified.   ------------------------------------------------------------------- Right ventricle:  The cavity size was normal. Wall thickness was normal. Systolic function was normal.  ------------------------------------------------------------------- Pulmonic valve:    Doppler:  Transvalvular velocity was within the normal range. There was no evidence for stenosis. There was trivial regurgitation.  ------------------------------------------------------------------- Tricuspid valve:   Structurally normal valve.    Doppler: Transvalvular velocity was within the normal range. There was mild regurgitation.  ------------------------------------------------------------------- Pulmonary artery:   The main pulmonary artery was normal-sized. Systolic pressure was within the normal range.  ------------------------------------------------------------------- Right atrium:  The atrium was normal in size.  ------------------------------------------------------------------- Pericardium:  The pericardium was normal in appearance. There was no pericardial effusion.  ------------------------------------------------------------------- Systemic veins: Inferior vena cava: The vessel was normal in size. The respirophasic diameter changes were in the normal range (= 50%), consistent with normal central venous pressure.  ------------------------------------------------------------------- Measurements   Left ventricle                            Value          Reference  LV ID, ED, PLAX chordal           (L)     36    mm       43 - 52  LV ID, ES, PLAX chordal                   25.4  mm       23 -  38  LV fx shortening, PLAX chordal            29    %        >=29  LV PW thickness, ED                       10.6  mm       ---------  IVS/LV PW ratio, ED               (H)     1.41           <=1.3  Stroke volume, 2D                         64    ml       ---------  Stroke volume/bsa, 2D                     29    ml/m^2   ---------  LV ejection fraction, 1-p A4C             62    %        ---------  LV end-diastolic volume, 2-p              89    ml       ---------  LV end-systolic volume, 2-p               32    ml       ---------  LV ejection fraction, 2-p                 64    %        ---------  Stroke volume, 2-p  57    ml       ---------  LV end-diastolic volume/bsa, 2-p          40    ml/m^2   ---------  LV end-systolic volume/bsa, 2-p           14    ml/m^2   ---------  Stroke volume/bsa, 2-p                    25.7  ml/m^2   ---------  LV e&', lateral                            5.07  cm/s     ---------  LV E/e&', lateral                          14.5           ---------  LV e&', medial                             4.48  cm/s     ---------  LV E/e&', medial                           16.41          ---------  LV e&', average                            4.78  cm/s     ---------  LV E/e&', average                          15.39          ---------    Ventricular septum                        Value          Reference  IVS thickness, ED                         14.9  mm       ---------    LVOT                                      Value          Reference  LVOT ID, S                                20    mm       ---------  LVOT area                                 3.14  cm^2     ---------  LVOT ID                                   20    mm       ---------  LVOT peak velocity, S  78.2  cm/s     ---------  LVOT mean velocity, S                     54.6  cm/s     ---------  LVOT VTI, S                               20.5  cm       ---------  LVOT peak  gradient, S                     2     mm Hg    ---------  Stroke volume (SV), LVOT DP               64.4  ml       ---------  Stroke index (SV/bsa), LVOT DP            29    ml/m^2   ---------    Aortic valve                              Value          Reference  Aortic valve peak velocity, S             437   cm/s     ---------  Aortic valve mean velocity, S             309   cm/s     ---------  Aortic valve VTI, S                       94    cm       ---------  Aortic mean gradient, S                   43    mm Hg    ---------  Aortic peak gradient, S                   76    mm Hg    ---------  VTI ratio, LVOT/AV                        0.22           ---------  Aortic valve area, VTI                    0.68  cm^2     ---------  Aortic valve area/bsa, VTI                0.31  cm^2/m^2 ---------  Velocity ratio, peak, LVOT/AV             0.18           ---------  Aortic valve area, peak velocity          0.56  cm^2     ---------  Aortic valve area/bsa, peak               0.25  cm^2/m^2 ---------  velocity  Velocity ratio, mean, LVOT/AV             0.18           ---------  Aortic valve area, mean velocity          0.55  cm^2     ---------  Aortic valve area/bsa, mean               0.25  cm^2/m^2 ---------  velocity    Aorta                                     Value          Reference  Aortic root ID, ED                        32    mm       ---------  Ascending aorta ID, A-P, S                35    mm       ---------    Left atrium                               Value          Reference  LA ID, A-P, ES                            37    mm       ---------  LA ID/bsa, A-P                            1.67  cm/m^2   <=2.2  LA volume, S                              55    ml       ---------  LA volume/bsa, S                          24.8  ml/m^2   ---------  LA volume, ES, 1-p A4C                    43    ml       ---------  LA volume/bsa, ES, 1-p A4C                19.4  ml/m^2   ---------   LA volume, ES, 1-p A2C                    66    ml       ---------  LA volume/bsa, ES, 1-p A2C                29.8  ml/m^2   ---------    Mitral valve                              Value          Reference  Mitral E-wave peak velocity               73.5  cm/s     ---------  Mitral A-wave peak velocity               94.3  cm/s     ---------  Mitral deceleration time          (H)  352   ms       150 - 230  Mitral peak gradient, D                   2     mm Hg    ---------  Mitral E/A ratio, peak                    0.8            ---------    Systemic veins                            Value          Reference  Estimated CVP                             3     mm Hg    ---------    Right ventricle                           Value          Reference  RV s&', lateral, S                         12.2  cm/s     ---------  Legend: (L)  and  (H)  mark values outside specified reference range.  ------------------------------------------------------------------- Prepared and Electronically Authenticated by  Jenkins Rouge, M.D. 2018-01-12T17:27:22    STS Risk Calculator  Procedure    AVR  Risk of Mortality   2.1% Morbidity or Mortality  13.2% Prolonged LOS   5.8% Short LOS    38.2% Permanent Stroke   0.9% Prolonged Vent Support  8.9% DSW Infection    0.4% Renal Failure    3.5% Reoperation    5.0%    Impression:  Patient has stage D severe symptomatic aortic stenosis. She presents with symptoms of chest pain that are somewhat atypical and occur both with activity and at rest but possibly consistent with angina pectoris. She also describes stable symptoms of mild exertional shortness breath consistent with chronic diastolic congestive heart failure, New York Heart Association functional class II. I have personally reviewed the patient's recent transthoracic echocardiogram. The patient's aortic valve appears to be functionally bicuspid with fusion of the left and right cusps of the valve.  There is moderate thickening, calcification, and restricted leaflet mobility involving the fused leaflets in particular. The non-coronary leaflet appears to move a little bit better. He velocity across the aortic valve was measured well above 4 m/s corresponding to mean transvalvular gradient greater than 40 mmHg. Left ventricular systolic function remains normal. I agree the patient should undergo aortic valve replacement. Risks associated with conventional surgery may be relatively low but mildly elevated because of the patient's obesity and comorbid medical problems.  She will need to undergo diagnostic cardiac catheterization to rule out the presence of significant coronary artery disease.   Plan:  The patient and her husband were counseled at length regarding treatment alternatives for management of severe aortic stenosis including continued medical therapy versus proceeding with aortic valve replacement in the near future.  The natural history of aortic stenosis was reviewed, as was long term prognosis with medical therapy alone.  Surgical options were discussed at length including conventional surgical aortic valve replacement through either a full  median sternotomy or using minimally invasive techniques.  Other alternatives including rapid-deployment bioprosthetic tissue valve replacement, transcatheter aortic valve replacement, patch enlargement of the aortic root, stentless porcine aortic root replacement, valve repair, the Ross autograft procedure, and homograft aortic root replacement were discussed.  Discussion was held comparing the relative risks of mechanical valve replacement with need for lifelong anticoagulation versus use of a bioprosthetic tissue valve and the associated potential for late structural valve deterioration and failure.   The patient is interested in proceeding with further diagnostic testing and elective aortic valve replacement in the near future. She will need to undergo  left and right heart catheterization. In addition, we will obtain a cardiac gated CT angiogram of the heart to rule out the possibility of aneurysmal enlargement of the ascending aorta in the setting of bicuspid aortic valve disease and more precisely evaluate the anatomical characteristics of that aortic valve and aortic root. The patient will return to our office in approximately 2 weeks to review the results of these tests and discuss treatment options further. All of her questions have been addressed.   I spent in excess of 90 minutes during the conduct of this office consultation and >50% of this time involved direct face-to-face encounter with the patient for counseling and/or coordination of their care.    Valentina Gu. Roxy Manns, MD 07/16/2016 12:07 PM

## 2016-08-02 NOTE — Interval H&P Note (Signed)
History and Physical Interval Note:  08/02/2016 8:56 AM  [name]  has presented today for surgery, with the diagnosis of arotic stenosis  The various methods of treatment have been discussed with the patient and family. After consideration of risks, benefits and other options for treatment, the patient has consented to  Procedure(s): Right/Left Heart Cath and Coronary Angiography (N/A) as a surgical intervention .  The patient's history has been reviewed, patient examined, no change in status, stable for surgery.  I have reviewed the patient's chart and labs.  Questions were answered to the patient's satisfaction.     Sherren Mocha

## 2016-08-06 ENCOUNTER — Encounter: Payer: Self-pay | Admitting: Family Medicine

## 2016-08-12 ENCOUNTER — Encounter: Payer: Self-pay | Admitting: Thoracic Surgery (Cardiothoracic Vascular Surgery)

## 2016-08-12 ENCOUNTER — Other Ambulatory Visit: Payer: Self-pay | Admitting: *Deleted

## 2016-08-12 ENCOUNTER — Ambulatory Visit (INDEPENDENT_AMBULATORY_CARE_PROVIDER_SITE_OTHER): Payer: Medicare Other | Admitting: Thoracic Surgery (Cardiothoracic Vascular Surgery)

## 2016-08-12 VITALS — BP 156/85 | HR 85 | Resp 16 | Ht 66.0 in | Wt 224.0 lb

## 2016-08-12 DIAGNOSIS — I350 Nonrheumatic aortic (valve) stenosis: Secondary | ICD-10-CM | POA: Diagnosis not present

## 2016-08-12 NOTE — Patient Instructions (Signed)
Stop taking aspirin  Continue taking all other medications without change through the day before surgery.  Have nothing to eat or drink after midnight the night before surgery.  On the morning of surgery take only Prilosec, Relafen and Effexor with a sip of water.

## 2016-08-12 NOTE — Progress Notes (Signed)
[name] [address]       [place] [address]             [phone]     CARDIOTHORACIC SURGERY OFFICE NOTE  Referring Provider is Dorothy Spark, MD PCP is Garret Reddish, MD   HPI:  Patient returns to the office today for follow-up of stage D severe symptomatic aortic stenosis. She was originally seen in consultation on 07/16/2016. Since that she underwent cardiac gated CT angiogram of the heart and diagnostic cardiac catheterization. She returns to the office today to review the results of these tests and discuss treatment options further. She reports no new problems or complaints. She continues to experience intermittent fleeting episodes of chest discomfort. Symptoms are typically very brief, lasting less than a minute at a time.  They can occur both with activity or at rest. They are not necessarily associated with shortness of breath. The patient has stable symptoms of exertional shortness of breath. She also reports intermittent dizzy spells. She has not had syncope. The remainder of her review of systems is unchanged from previously.   Current Outpatient Prescriptions  Medication Sig Dispense Refill  . aspirin 81 MG tablet Take 1 tablet (81 mg total) by mouth daily. 30 tablet 0  . atorvastatin (LIPITOR) 40 MG tablet TAKE 1 TABLET BY MOUTH AT NIGHT ON TUESDAY AND FRIDAY 30 tablet 4  . Biotin 5000 MCG TABS Take 1 tablet by mouth daily.    . Blood Glucose Monitoring Suppl (ONETOUCH VERIO) W/DEVICE KIT 1 kit by Does not apply route once. 1 kit 0  . calcium-vitamin D (OSCAL WITH D) 500-200 MG-UNIT per tablet Take 2 tablets by mouth daily.     . fluticasone (FLONASE) 50 MCG/ACT nasal spray Place 2 sprays into both nostrils daily.     [name] glucose blood (ONETOUCH VERIO) test strip Use to test blood sugars daily. Dx: E11.9 100 each 12  . ipratropium (ATROVENT) 0.03 % nasal spray Place 2 sprays into both nostrils 2 (two) times daily as needed for rhinitis (allergies).   3   . Lancets (ONETOUCH ULTRASOFT) lancets Use to test blood sugars daily. Dx: E11.9 100 each 12  . loratadine (CLARITIN) 10 MG tablet Take 10 mg by mouth daily.    . metFORMIN (GLUCOPHAGE) 500 MG tablet TAKE 1 TABLET WITH BREAKFAST AND 1/2 TO 1 TABLET WITH DINNER. (Patient taking differently: TAKE 1 TABLET WITH BREAKFAST AND DINNER.) 60 tablet 5  . metoCLOPramide (REGLAN) 10 MG tablet TAKE 1/2 TABLET BY MOUTH 4 TIMES A DAY 60 tablet 5  . Multiple Vitamins-Minerals (ONE DAILY MULTIVITAMIN WOMEN PO) Take 1 tablet by mouth daily.    . nabumetone (RELAFEN) 750 MG tablet TAKE 2 TABLETS BY MOUTH EVERY DAY (Patient taking differently: TAKE 1 TABLET BY MOUTH AT BEDTIME.) 60 tablet 5  . omeprazole (PRILOSEC) 40 MG capsule Take 1 capsule (40 mg total) by mouth daily. 30 capsule 11  . Probiotic Product (ALIGN) 4 MG CAPS Take 4 mg by mouth daily.     . psyllium (METAMUCIL SMOOTH TEXTURE) 28 % packet Take 1 packet by mouth 2 (two) times daily.     . SUMAtriptan (IMITREX) 100 MG tablet TAKE 1 TABLET BY MOUTH EVERY 2 HOURS AS NEEDED FOR MIGRAINE (Patient taking differently: Take 100 mg by mouth every 2 (two) hours as needed. TAKE 1 TABLET BY MOUTH EVERY 2 HOURS AS NEEDED FOR MIGRAINE. DO NOT EXCEED 2 DOSES IN 24 HOURS.) 10 tablet 3  .  venlafaxine XR (EFFEXOR-XR) 150 MG 24 hr capsule TAKE ONE CAPSULE BY MOUTH EVERY DAY WITH BREAKFAST 30 capsule 5   No current facility-administered medications for this visit.       Physical Exam:   BP (!) 156/85 (BP Location: Right Arm, Patient Position: Sitting, Cuff Size: Large)   Pulse 85   Resp 16   Ht 5' 6"  (1.676 m)   Wt 224 lb (101.6 kg)   SpO2 97% Comment: ON RA  BMI 36.15 kg/m   General:  Well-appearing  Chest:   Clear to auscultation  CV:   Regular rate and rhythm with prominent systolic murmur  Incisions:  n/a  Abdomen:  Soft nontender  Extremities:  Warm and well-perfused  Diagnostic Tests:  Cardiac TAVR CT  TECHNIQUE: The patient was scanned on a  Philips 256 scanner. A 120 kV retrospective scan was triggered in the descending thoracic aorta at 111 HU's. Gantry rotation speed was 270 msecs and collimation was .9 mm. 10 mg of iv Metoprolol and no nitro were given. The 3D data set was reconstructed in 5% intervals of the R-R cycle. Systolic and diastolic phases were analyzed on a dedicated work station using MPR, MIP and VRT modes. The patient received 80 cc of contrast.  FINDINGS: Aortic Valve: Bicuspid valve with severe thickening and calcifications of the leaflets that have severely restricted opening.  Aorta: Normal size of the aortic root, sinotubular junction, ascending, descending aorta and aortic arch. There are only minimal calcifications in the aortic arch and no dissection.  Sinus of Valsalva Measurements:  32 x 30 mm  Sinotubular Junction:  30 x 27 mm  Ascending Thoracic Aorta:  35 x 35 mm  Aortic Arch:  26 x 24 mm  Descending Thoracic Aorta:  24 x 23 mm  Coronary Artery Height above Annulus:  Left Main:  12 mm  Right Coronary:  14 mm  Coronary Arteries:  Normal origin.  Right dominance.  No NTG given.  Other findings:  No ASD/VSD seen.  Normal pulmonary vein drainage into the left atrium.  A large left atrial appendage with no thrombus.  Normal size of the pulmonary artery.  IMPRESSION: 1. Bicuspid aortic valve with severe thickening and calcifications of the leaflets that have severely restricted opening.  2. Normal size of the aortic root, sinotubular junction, ascending, descending aorta and aortic arch. There are only minimal calcifications in the aortic arch and no dissection.  [name]   Electronically Signed   By: [name]   On: 07/25/2016 14:47    Right/Left Heart Cath and Coronary Angiography  Conclusion   1. Minor nonobstructive CAD 2. Normal right heart hemodynamics (including normal PCWP) 3. Known severe aortic stenosis   FU as  planned with Dr Roxy Manns for aortic valve replacement  Indications   Severe aortic stenosis [I35.0 (ICD-10-CM)]  Procedural Details/Technique   Technical Details INDICATION: Severe bicuspid aortic valve stenosis. Preoperative study.  PROCEDURAL DETAILS: There was an indwelling IV in a right antecubital vein. Using normal sterile technique, the IV was changed out for a 5 Fr brachial sheath over a 0.018 inch wire. The right wrist was then prepped, draped, and anesthetized with 1% lidocaine. Using the modified Seldinger technique a 5/6 French Slender sheath was placed in the right radial artery. Intra-arterial verapamil was administered through the radial artery sheath. IV heparin was administered after a JR4 catheter was advanced into the central aorta. A Swan-Ganz catheter was used for the right heart catheterization. Standard protocol was  followed for recording of right heart pressures and sampling of oxygen saturations. Fick cardiac output was calculated. Standard Judkins catheters were used for selective coronary angiography. The aortic valve is not crossed as the patient has definitive echo criteria for severe aortic stenosis. There were no immediate procedural complications. The patient was transferred to the post catheterization recovery area for further monitoring.     Estimated blood loss <50 mL.  During this procedure the patient was administered the following to achieve and maintain moderate conscious sedation: Versed 2 mg, Fentanyl 25 mcg, while the patient's heart rate, blood pressure, and oxygen saturation were continuously monitored. The period of conscious sedation was 26 minutes, of which I was present face-to-face 100% of this time.    Coronary Findings   Dominance: Right  Left Anterior Descending  There is mild the vessel.  Left Circumflex  Vessel is angiographically normal.  Right Coronary Artery  The vessel exhibits minimal luminal irregularities.  Coronary Diagrams    Diagnostic Diagram     Implants     No implant documentation for this case.  PACS Images   Show images for Cardiac catheterization   Link to Procedure Log   Procedure Log    Hemo Data   Flowsheet Row Most Recent Value  Fick Cardiac Output 7.41 L/min  Fick Cardiac Output Index 3.55 (L/min)/BSA  RA A Wave 9 mmHg  RA V Wave 6 mmHg  RA Mean 5 mmHg  RV Systolic Pressure 30 mmHg  RV Diastolic Pressure 2 mmHg  RV EDP 8 mmHg  PA Systolic Pressure 28 mmHg  PA Diastolic Pressure 11 mmHg  PA Mean 18 mmHg  PW A Wave 14 mmHg  PW V Wave 10 mmHg  PW Mean 11 mmHg  AO Systolic Pressure 417 mmHg  AO Diastolic Pressure 74 mmHg  AO Mean 96 mmHg  QP/QS 1  TPVR Index 5.08 HRUI  TSVR Index 27.09 HRUI  PVR SVR Ratio 0.08  TPVR/TSVR Ratio 0.19      Impression:  Patient has stage D severe symptomatic aortic stenosis. She presents with symptoms of chest pain that are somewhat atypical and occur both with activity and at rest but possibly consistent with angina pectoris. She also describes stable symptoms of mild exertional shortness breath consistent with chronic diastolic congestive heart failure, [place] Heart Association functional class II.  In addition, she has been having intermittent dizzy spells without syncope. I have personally reviewed the patient's recent transthoracic echocardiogram, CT angiogram and cardiac catheterization. The patient's aortic valve appears to be functionally bicuspid with fusion of the left and right cusps of the valve. There is moderate thickening, calcification, and restricted leaflet mobility involving the fused leaflets in particular. The non-coronary leaflet appears to move a little bit better. He velocity across the aortic valve was measured well above 4 m/s corresponding to mean transvalvular gradient greater than 40 mmHg. Left ventricular systolic function remains normal.  Cardiac gated CT angiogram confirms the presence of bicuspid aortic valve  morphology with no complicating features. The overall size of the aortic annulus and aortic root is normal.  Diagnostic cardiac catheterization is notable for the absence of significant coronary artery disease. I agree the patient should undergo aortic valve replacement. Risks associated with conventional surgery may be relatively low but mildly elevated because of the patient's obesity and comorbid medical problems.   Plan:  The patient and her husband were again counseled at length regarding treatment alternatives for management of severe aortic stenosis including continued medical  therapy versus proceeding with aortic valve replacement in the near future.  The natural history of aortic stenosis was reviewed, as was long term prognosis with medical therapy alone.  Surgical options were discussed at length including conventional surgical aortic valve replacement through either a full median sternotomy or using minimally invasive techniques.  Other alternatives including rapid-deployment bioprosthetic tissue valve replacement, transcatheter aortic valve replacement, patch enlargement of the aortic root, stentless porcine aortic root replacement, valve repair, the Ross autograft procedure, and homograft aortic root replacement were discussed.  Discussion was held comparing the relative risks of mechanical valve replacement with need for lifelong anticoagulation versus use of a bioprosthetic tissue valve and the associated potential for late structural valve deterioration and failure.  This discussion was placed in the context of the patient's particular circumstances, and as a result the patient specifically requests that their valve be replaced using a bioprosthetic tissue valve.  The potential advantages and disadvantages associated with use of a rapid-deployment bioprosthetic aortic valve were discussed, including the risks of paravalvular leak, need for permanent pacemaker placement, and expectations for  long-term durability.  Alternative surgical approaches of been discussed and the patient has expressed interest in minimally invasive approach for surgery. Because of her morbid obesity and associated body habitus I would be reluctant to consider the patient a candidate for mini thoracotomy approach. However, she may do quite well with partial upper median sternotomy. The patient understands and accepts all potential associated risks of surgery including but not limited to risk of death, stroke, myocardial infarction, congestive heart failure, respiratory failure, renal failure, pneumonia, bleeding requiring blood transfusion and or reexploration, arrhythmia, heart block or bradycardia requiring permanent pacemaker, aortic dissection or other major vascular complication, pleural effusions or other delayed complications related to continued congestive heart failure, and other late complications related to valve replacement including structural valve deterioration and failure, thrombosis, endocarditis, or paravalvular leak.  All of her questions have been addressed. We plan to proceed with surgery on 08/23/2016. The patient has been instructed to stop taking aspirin between now and the time of surgery.   I spent in excess of 30 minutes during the conduct of this office consultation and >50% of this time involved direct face-to-face encounter with the patient for counseling and/or coordination of their care.    Valentina Gu. Roxy Manns, MD 08/12/2016 1:04 PM

## 2016-08-13 ENCOUNTER — Ambulatory Visit: Payer: Medicare Other | Admitting: Thoracic Surgery (Cardiothoracic Vascular Surgery)

## 2016-08-17 ENCOUNTER — Encounter: Payer: Self-pay | Admitting: Cardiology

## 2016-08-18 NOTE — H&P (Signed)
[address]       [place] [address]             [phone]          CARDIOTHORACIC SURGERY HISTORY AND PHYSICAL EXAM  Referring Provider is Dorothy Spark, MD PCP is Garret Reddish, MD      Chief Complaint  Patient presents with  . Aortic Stenosis    Surgical eval, ECHO 07/05/16    HPI:  Patient is a 66 year old obese female with history of aortic stenosis, atypical chest pain, obstructive sleep apnea on CPAP, GE reflux disease, and type 2 diabetes mellitus who has been referred for surgical consultation to discuss treatment options for management of severe symptomatic aortic stenosis. The patient has been obese for the majority of her adult life. She was followed for many years by Dr. Arnoldo Morale but more recently has been followed by Dr. Yong Channel subsequent to Dr. Arnoldo Morale retirement. Dr. Yong Channel noted a systolic murmur on physical exam and transthoracic echocardiogram performed in 2015 revealed normal left ventricular systolic function with moderate aortic stenosis, mean transvalvular gradient estimated 30 mmHg.. The patient began to experience symptoms of chest pain and was referred to Dr. Meda Coffee for cardiology consultation in 2016.  Nuclear stress test was performed and felt to be low risk with ejection fraction measured 60%.  The patient was started on low-dose aspirin and has been followed ever since by Dr. Meda Coffee.  She was seen in follow-up recently and noted to complain of progressive symptoms of exertional shortness of breath with worsening fatigue. Follow-up echocardiogram was performed 07/05/2016 revealed significant progression of aortic stenosis. Left ventricular function remained normal with ejection fraction estimated 55-60%. Peak velocity across the aortic valve was measured as high as 4.4 m/s corresponding to mean transvalvular gradient estimated 43 mmHg. The patient was referred for surgical consultation.  The patient is married and lives in  [place] close to [place] with her husband. 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 She lives a somewhat sedentary lifestyle although she does make an effort to go to the gym intermittently where she performs some low level aerobic exercises and uses weights. She also walks her dogs on a regular basis. She describes stable symptoms of exertional shortness of breath if she tries to push herself. She also has long history of chest pain and chest tightness that had features that are both consistent and atypical for angina pectoris. Symptoms may be brought on with activity but occasionally occur with rest. The patient has recently had some mild dizzy spells without syncope. She denies any resting shortness of breath, PND, orthopnea, or lower extremity edema. She has history of sleep apnea and recently started using CPAP at night.   Patient returns to the office today for follow-up of stage D severe symptomatic aortic stenosis. She was originally seen in consultation on 07/16/2016. Since that she underwent cardiac gated CT angiogram of the heart and diagnostic cardiac catheterization. She returns to the office today to review the results of these tests and discuss treatment options further. She reports no new problems or complaints. She continues to experience intermittent fleeting episodes of chest discomfort. Symptoms are typically very brief, lasting less than a minute at a time.  They can occur both with activity or at rest. They are not necessarily associated with shortness of breath. The patient has stable symptoms of  exertional shortness of breath. She also reports intermittent dizzy spells. She has not had syncope. The remainder of her review of systems is unchanged from previously.   Past Medical History:  Diagnosis Date  . Allergy   . Anxiety   . Arthritis    back & knee  . Asthma    /w  resp. upset   . Colon polyps   . Diabetes mellitus without complication ([place])   . Gastric polyps   . Gastroparesis   . GERD (gastroesophageal reflux disease)   . History of migraine headaches   . Hyperlipidemia   . IBS (irritable bowel syndrome)   . Lumbar disc disease     Past Surgical History:  Procedure Laterality Date  . ABDOMINAL HYSTERECTOMY    . BLADDER SUSPENSION  2007  . BREAST ENHANCEMENT SURGERY  1980  . CARPAL TUNNEL RELEASE Bilateral   . DORSAL COMPARTMENT RELEASE Right 09/15/2014   Procedure: RIGHT WRIST DEQUERVAINS RELEASE ;  Surgeon: Kathryne Hitch, MD;  Location: [facility];  Service: Orthopedics;  Laterality: Right;  . ESOPHAGOGASTRODUODENOSCOPY    . KNEE ARTHROSCOPY  04/15/2012   Procedure: ARTHROSCOPY KNEE;  Surgeon: [name], MD;  Location: [facility];  Service: Orthopedics;  Laterality: Right;  . LAPAROSCOPIC CHOLECYSTECTOMY    . LASIK    . OOPHORECTOMY    . PALATE TO GINGIVA GRAFT  2017  . RIGHT/LEFT HEART CATH AND CORONARY ANGIOGRAPHY N/A 08/02/2016   Procedure: Right/Left Heart Cath and Coronary Angiography;  Surgeon: Sherren Mocha, MD;  Location: [facility];  Service: Cardiovascular;  Laterality: N/A;  . ROTATOR CUFF REPAIR Left   . TOTAL KNEE ARTHROPLASTY  04/15/2012   Procedure: TOTAL KNEE ARTHROPLASTY;  Surgeon: [name], MD;  Location: [facility];  Service: Orthopedics;  Laterality: Right;  . TRIGGER FINGER RELEASE Right 04/20/2015   Procedure: RIGHT TRIGGER FINGER RELEASE (TENDON SHEALTH INCISION) ;  Surgeon: [name], MD;  Location: [facility];  Service: Orthopedics;  Laterality: Right;    Family History  Problem Relation Age of Onset  . COPD Mother   . Colon polyps Mother   . Irritable bowel syndrome Mother   . Heart disease Father   . Alcohol abuse Father   . Colon polyps Maternal Aunt     Social History Social History  Substance Use Topics  . Smoking status: Never Smoker  . Smokeless  tobacco: Never Used  . Alcohol use No    Prior to Admission medications   Medication Sig Start Date End Date Taking? Authorizing Provider  aspirin 81 MG tablet Take 1 tablet (81 mg total) by mouth daily. 12/22/14  Yes Dorothy Spark, MD  atorvastatin (LIPITOR) 40 MG tablet TAKE 1 TABLET BY MOUTH AT NIGHT ON TUESDAY AND FRIDAY 07/29/16  Yes [name], MD  Biotin 5000 MCG TABS Take 5,000 mcg by mouth daily.    Yes Historical Provider, MD  calcium-vitamin D (OSCAL WITH D) 500-200 MG-UNIT per tablet Take 2 tablets by mouth daily.    Yes Historical Provider, MD  dextromethorphan (DELSYM) 30 MG/5ML liquid Take 30 mg by mouth at bedtime.   Yes Historical Provider, MD  fluticasone (FLONASE) 50 MCG/ACT nasal spray Place 2 sprays into both nostrils daily.  04/24/14  Yes Historical Provider, MD  glucose blood (ONETOUCH VERIO) test strip Use to test blood sugars daily. Dx: E11.9 04/06/15  Yes [name], MD  ipratropium (ATROVENT) 0.03 % nasal spray Place 2 sprays  into both nostrils 2 (two) times daily.  05/15/16  Yes Historical Provider, MD  loratadine (CLARITIN) 10 MG tablet Take 10 mg by mouth at bedtime.    Yes Historical Provider, MD  metFORMIN (GLUCOPHAGE) 500 MG tablet TAKE 1 TABLET WITH BREAKFAST AND 1/2 TO 1 TABLET WITH DINNER. Patient taking differently: TAKE 1 TABLET (500 MG) WITH BREAKFAST AND DINNER. 05/31/16  Yes [name], MD  metoCLOPramide (REGLAN) 10 MG tablet TAKE 1/2 TABLET BY MOUTH 4 TIMES A DAY Patient taking differently: TAKE 1/2 TABLET (5 MG) BY MOUTH 4 TIMES A DAY 02/22/16  Yes [name], MD  Multiple Vitamin (MULTIVITAMIN WITH MINERALS) TABS tablet Take 1 tablet by mouth daily.   Yes Historical Provider, MD  nabumetone (RELAFEN) 750 MG tablet TAKE 2 TABLETS BY MOUTH EVERY DAY Patient taking differently: TAKE 1 TABLET (750 MG) BY MOUTH AT BEDTIME. 11/06/15  Yes [name], MD  omeprazole (PRILOSEC OTC) 20 MG tablet Take 40 mg by mouth daily with  breakfast.   Yes Historical Provider, MD  Probiotic Product (ALIGN) 4 MG CAPS Take 4 mg by mouth daily.    Yes Historical Provider, MD  psyllium (METAMUCIL SMOOTH TEXTURE) 28 % packet Take 1 packet by mouth daily before breakfast.    Yes Historical Provider, MD  SUMAtriptan (IMITREX) 100 MG tablet TAKE 1 TABLET BY MOUTH EVERY 2 HOURS AS NEEDED FOR MIGRAINE Patient taking differently: Take 100 mg by mouth every 2 (two) hours as needed. TAKE 1 TABLET BY MOUTH EVERY 2 HOURS AS NEEDED FOR MIGRAINE. DO NOT EXCEED 2 DOSES IN 24 HOURS. 05/21/16  Yes [name], MD  venlafaxine XR (EFFEXOR-XR) 150 MG 24 hr capsule TAKE ONE CAPSULE BY MOUTH EVERY DAY WITH BREAKFAST 05/20/16  Yes [name], MD  Blood Glucose Monitoring Suppl (ONETOUCH VERIO) W/DEVICE KIT 1 kit by Does not apply route once. Patient not taking: Reported on 08/15/2016 04/06/15   [name], MD  Lancets [facility] ULTRASOFT) lancets Use to test blood sugars daily. Dx: E11.9 Patient not taking: Reported on 08/15/2016 04/06/15   [name], MD    Allergies  Allergen Reactions  . Erythromycin Nausea Only  . Penicillins Itching and Rash    Has patient had a PCN reaction causing immediate rash, facial/tongue/throat swelling, SOB or lightheadedness with hypotension: No Has patient had a PCN reaction causing severe rash involving mucus membranes or skin necrosis: No Has patient had a PCN reaction that required hospitalization: No Has patient had a PCN reaction occurring within the last 10 years: No  If all of the above answers are "NO", then may proceed with Cephalosporin use.     Review of Systems:              General:                      normal appetite, decreased energy, no weight gain, no weight loss, no fever             Cardiac:                       + chest pain with exertion, + chest pain at rest, + SOB with exertion, no resting SOB, no PND, no orthopnea, no palpitations, no arrhythmia, no atrial fibrillation,  no LE edema, + dizzy spells, no syncope             Respiratory:                 +  shortness of breath, no home oxygen, no productive cough, no dry cough, no bronchitis, no wheezing, no hemoptysis, + asthma, no pain with inspiration or cough, + sleep apnea, + CPAP at night             GI:                               no difficulty swallowing, + reflux, + frequent heartburn, no hiatal hernia, no abdominal pain, no constipation, no diarrhea, no hematochezia, no hematemesis, no melena             GU:                              no dysuria,  no frequency, no urinary tract infection, no hematuria, no kidney stones, no kidney disease             Vascular:                     no pain suggestive of claudication, no pain in feet, no leg cramps, no varicose veins, no DVT, no non-healing foot ulcer             Neuro:                         no stroke, no TIA's, no seizures, no headaches, no temporary blindness one eye,  no slurred speech, no peripheral neuropathy, no chronic pain, no instability of gait, no memory/cognitive dysfunction             Musculoskeletal:         + arthritis, no joint swelling, no myalgias, mild difficulty walking, normal mobility              Skin:                            no rash, no itching, no skin infections, no pressure sores or ulcerations             Psych:                         + anxiety, no depression, no nervousness, no unusual recent stress             Eyes:                           no blurry vision, + floaters, no recent vision changes, + wears glasses or contacts             ENT:                            + hearing loss, no loose or painful teeth, no dentures, last saw dentist Oct 2017             Hematologic:               no easy bruising, no abnormal bleeding, no clotting disorder, no frequent epistaxis             Endocrine:                   + diabetes, does not check CBG's at home  Physical Exam:              BP (!) 160/90    Pulse 85   Resp 20   Ht 5' 5.5" (1.664 m)   Wt 224 lb (101.6 kg)   SpO2 95% Comment: RA  BMI 36.71 kg/m              General:                      Obese, o/w  well-appearing             HEENT:                       Unremarkable              Neck:                           no JVD, no bruits, no adenopathy              Chest:                          clear to auscultation, symmetrical breath sounds, no wheezes, no rhonchi              CV:                              RRR, grade II-III/VI systolic murmur best RUSB             Abdomen:                    soft, non-tender, no masses              Extremities:                 warm, well-perfused, pulses diminished, no LE edema             Rectal/GU                   Deferred             Neuro:                         Grossly non-focal and symmetrical throughout             Skin:                            Clean and dry, no rashes, no breakdown   Diagnostic Tests:  Transthoracic Echocardiography  Patient: Ariela, Mochizuki MR #: [id number] Study Date: 07/05/2016 Gender: F Age: 40 Height: 166.4 cm Weight: 102.1 kg BSA: 2.22 m^2 Pt. Status: Room:  SONOGRAPHER [place], Will ATTENDING Ena Dawley, M.D. ORDERING Ena Dawley, M.D. REFERRING Ena Dawley, M.D. PERFORMING [facility], Outpatient  cc:  ------------------------------------------------------------------- LV EF: 55% - 60%  ------------------------------------------------------------------- Indications: (I35.0).  ------------------------------------------------------------------- History: PMH: Acquired from the patient and from the patient&'s chart. Chest pain. Aortic stenosis. Risk factors: Diabetes mellitus. Obese. Dyslipidemia.  ------------------------------------------------------------------- Study Conclusions  - Left ventricle: The cavity size was normal. Wall thickness  was increased in a pattern of moderate LVH. Systolic function was normal. The estimated ejection fraction was in the range of 55% to 60%. Wall motion was normal; there were  no regional wall motion abnormalities. Doppler parameters are consistent with abnormal left ventricular relaxation (grade 1 diastolic dysfunction). - Aortic valve: Functionally bicuspid; severely thickened, severely calcified leaflets. There was severe stenosis. - Atrial septum: No defect or patent foramen ovale was identified.  ------------------------------------------------------------------- Study data: Comparison was made to the study of 04/04/2015. Study status: Routine. Procedure: The patient reported no pain pre or post test. Transthoracic echocardiography for left ventricular function evaluation and for assessment of valvular function. Image quality was adequate. Study completion: There were no complications. Transthoracic echocardiography. M-mode, complete 2D, spectral Doppler, and color Doppler. Birthdate: Patient birthdate: Nov 18, 1950. Age: Patient is 66 yr old. Sex: Gender: female. BMI: 36.9 kg/m^2. Blood pressure: 130/70 Patient status: Outpatient. Study date: Study date: 07/05/2016. Study time: 03:09 PM. Location: [facility]  -------------------------------------------------------------------  ------------------------------------------------------------------- Left ventricle: The cavity size was normal. Wall thickness was increased in a pattern of moderate LVH. Systolic function was normal. The estimated ejection fraction was in the range of 55% to 60%. Wall motion was normal; there were no regional wall motion abnormalities. Doppler parameters are consistent with abnormal left ventricular relaxation (grade 1 diastolic dysfunction).  ------------------------------------------------------------------- Aortic valve: Likely bicuspid valve  severe stenosis now previosu mean gradient 35 mmHg has progressed Functionally bicuspid; severely thickened, severely calcified leaflets. Mobility was not restricted. Doppler: There was severe stenosis. There was no regurgitation. VTI ratio of LVOT to aortic valve: 0.22. Valve area (VTI): 0.68 cm^2. Indexed valve area (VTI): 0.31 cm^2/m^2. Peak velocity ratio of LVOT to aortic valve: 0.18. Valve area (Vmax): 0.56 cm^2. Indexed valve area (Vmax): 0.25 cm^2/m^2. Mean velocity ratio of LVOT to aortic valve: 0.18. Valve area (Vmean): 0.55 cm^2. Indexed valve area (Vmean): 0.25 cm^2/m^2. Mean gradient (S): 43 mm Hg. Peak gradient (S): 76 mm Hg.  ------------------------------------------------------------------- Aorta: Aortic root: The aortic root was normal in size.  ------------------------------------------------------------------- Mitral valve: Structurally normal valve. Mobility was not restricted. Doppler: Transvalvular velocity was within the normal range. There was no evidence for stenosis. There was trivial regurgitation. Peak gradient (D): 2 mm Hg.  ------------------------------------------------------------------- Left atrium: The atrium was normal in size.  ------------------------------------------------------------------- Atrial septum: No defect or patent foramen ovale was identified.  ------------------------------------------------------------------- Right ventricle: The cavity size was normal. Wall thickness was normal. Systolic function was normal.  ------------------------------------------------------------------- Pulmonic valve: Doppler: Transvalvular velocity was within the normal range. There was no evidence for stenosis. There was trivial regurgitation.  ------------------------------------------------------------------- Tricuspid valve: Structurally normal valve. Doppler: Transvalvular velocity was within the normal  range. There was mild regurgitation.  ------------------------------------------------------------------- Pulmonary artery: The main pulmonary artery was normal-sized. Systolic pressure was within the normal range.  ------------------------------------------------------------------- Right atrium: The atrium was normal in size.  ------------------------------------------------------------------- Pericardium: The pericardium was normal in appearance. There was no pericardial effusion.  ------------------------------------------------------------------- Systemic veins: Inferior vena cava: The vessel was normal in size. The respirophasic diameter changes were in the normal range (= 50%), consistent with normal central venous pressure.  ------------------------------------------------------------------- Measurements  Left ventricle Value Reference LV ID, ED, PLAX chordal (L) 36 mm 43 - 52 LV ID, ES, PLAX chordal 25.4 mm 23 - 38 LV fx shortening, PLAX chordal 29 % >=29 LV PW thickness, ED 10.6 mm --------- IVS/LV PW ratio, ED (H) 1.41 <=1.3 Stroke volume, 2D 64 ml --------- Stroke volume/bsa, 2D 29 ml/m^2 --------- LV ejection fraction, 1-p A4C 62 % --------- LV end-diastolic volume, 2-p 89 ml --------- LV end-systolic volume, 2-p 32 ml --------- LV ejection fraction, 2-p 64 % --------- Stroke volume,  2-p 57 ml --------- LV end-diastolic volume/bsa, 2-p 40 ml/m^2 --------- LV end-systolic volume/bsa, 2-p 14 ml/m^2 --------- Stroke volume/bsa, 2-p 25.7 ml/m^2  --------- LV e&', lateral 5.07 cm/s --------- LV E/e&', lateral 14.5 --------- LV e&', medial 4.48 cm/s --------- LV E/e&', medial 16.41 --------- LV e&', average 4.78 cm/s --------- LV E/e&', average 15.39 ---------  Ventricular septum Value Reference IVS thickness, ED 14.9 mm ---------  LVOT Value Reference LVOT ID, S 20 mm --------- LVOT area 3.14 cm^2 --------- LVOT ID 20 mm --------- LVOT peak velocity, S 78.2 cm/s --------- LVOT mean velocity, S 54.6 cm/s --------- LVOT VTI, S 20.5 cm --------- LVOT peak gradient, S 2 mm Hg --------- Stroke volume (SV), LVOT DP 64.4 ml --------- Stroke index (SV/bsa), LVOT DP 29 ml/m^2 ---------  Aortic valve Value Reference Aortic valve peak velocity, S 437 cm/s --------- Aortic valve mean velocity, S 309 cm/s --------- Aortic valve VTI, S 94 cm --------- Aortic mean gradient, S 43 mm Hg --------- Aortic peak gradient, S 76 mm Hg --------- VTI ratio, LVOT/AV 0.22 --------- Aortic valve area, VTI 0.68 cm^2 --------- Aortic valve area/bsa, VTI 0.31 cm^2/m^2 --------- Velocity ratio, peak, LVOT/AV 0.18  --------- Aortic valve area, peak velocity 0.56 cm^2 --------- Aortic valve area/bsa, peak 0.25 cm^2/m^2 --------- velocity Velocity ratio, mean, LVOT/AV 0.18 --------- Aortic valve area, mean velocity 0.55 cm^2 --------- Aortic valve area/bsa, mean 0.25 cm^2/m^2 --------- velocity  Aorta Value Reference Aortic root ID, ED 32 mm --------- Ascending aorta ID, A-P, S 35 mm ---------  Left atrium Value Reference LA ID, A-P, ES 37 mm --------- LA ID/bsa, A-P 1.67 cm/m^2 <=2.2 LA volume, S 55 ml --------- LA volume/bsa, S 24.8 ml/m^2 --------- LA volume, ES, 1-p A4C 43 ml --------- LA volume/bsa, ES, 1-p A4C 19.4 ml/m^2 --------- LA volume, ES, 1-p A2C 66 ml --------- LA volume/bsa, ES, 1-p A2C 29.8 ml/m^2 ---------  Mitral valve Value Reference Mitral E-wave peak velocity 73.5 cm/s --------- Mitral A-wave peak velocity 94.3 cm/s --------- Mitral deceleration time (H) 352 ms 150 - 230 Mitral peak gradient, D 2 mm Hg --------- Mitral E/A ratio, peak 0.8 ---------  Systemic veins Value Reference Estimated CVP 3 mm Hg ---------  Right ventricle Value Reference RV s&', lateral, S 12.2 cm/s ---------  Legend: (L) and (H) mark  values outside specified reference range.  ------------------------------------------------------------------- Prepared and Electronically Authenticated by  Jenkins Rouge, M.D. 2018-01-12T17:27:22    STS Risk Calculator  Procedure                                          AVR  Risk of Mortality                                2.1% Morbidity or Mortality                       13.2% Prolonged LOS                                   5.8% Short LOS  38.2% Permanent Stroke                             0.9% Prolonged Vent Support                     8.9% DSW Infection                                     0.4% Renal Failure                                       3.5% Reoperation                                        5.0%   Cardiac TAVR CT  TECHNIQUE: The patient was scanned on a Philips 256 scanner. A 120 kV retrospective scan was triggered in the descending thoracic aorta at 111 HU's. Gantry rotation speed was 270 msecs and collimation was .9 mm. 10 mg of iv Metoprolol and no nitro were given. The 3D data set was reconstructed in 5% intervals of the R-R cycle. Systolic and diastolic phases were analyzed on a dedicated work station using MPR, MIP and VRT modes. The patient received 80 cc of contrast.  FINDINGS: Aortic Valve: Bicuspid valve with severe thickening and calcifications of the leaflets that have severely restricted opening.  Aorta: Normal size of the aortic root, sinotubular junction, ascending, descending aorta and aortic arch. There are only minimal calcifications in the aortic arch and no dissection.  Sinus of Valsalva Measurements: 32 x 30 mm  Sinotubular Junction: 30 x 27 mm  Ascending Thoracic Aorta: 35 x 35 mm  Aortic Arch: 26 x 24 mm  Descending Thoracic Aorta: 24 x 23 mm  Coronary Artery Height above Annulus:  Left Main: 12 mm  Right Coronary: 14 mm  Coronary Arteries: Normal origin.  Right dominance. No NTG given.  Other findings:  No ASD/VSD seen.  Normal pulmonary vein drainage into the left atrium.  A large left atrial appendage with no thrombus.  Normal size of the pulmonary artery.  IMPRESSION: 1. Bicuspid aortic valve with severe thickening and calcifications of the leaflets that have severely restricted opening.  2. Normal size of the aortic root, sinotubular junction, ascending, descending aorta and aortic arch. There are only minimal calcifications in the aortic arch and no dissection.  Ena Dawley   Electronically Signed By: Ena Dawley On: 07/25/2016 14:47    Right/Left Heart Cath and Coronary Angiography  Conclusion   1. Minor nonobstructive CAD 2. Normal right heart hemodynamics (including normal PCWP) 3. Known severe aortic stenosis   FU as planned with Dr Roxy Manns for aortic valve replacement  Indications   Severe aortic stenosis [I35.0 (ICD-10-CM)]  Procedural Details/Technique   Technical Details INDICATION: Severe bicuspid aortic valve stenosis. Preoperative study.  PROCEDURAL DETAILS: There was an indwelling IV in a right antecubital vein. Using normal sterile technique, the IV was changed out for a 5 Fr brachial sheath over a 0.018 inch wire. The right wrist was then prepped, draped, and anesthetized with 1% lidocaine. Using the modified Seldinger technique a 5/6 French Slender sheath was placed in the right radial artery. Intra-arterial  verapamil was administered through the radial artery sheath. IV heparin was administered after a JR4 catheter was advanced into the central aorta. A Swan-Ganz catheter was used for the right heart catheterization. Standard protocol was followed for recording of right heart pressures and sampling of oxygen saturations. Fick cardiac output was calculated. Standard Judkins catheters were used for selective coronary angiography. The aortic valve is not crossed as the patient  has definitive echo criteria for severe aortic stenosis. There were no immediate procedural complications. The patient was transferred to the post catheterization recovery area for further monitoring.     Estimated blood loss <50 mL.  During this procedure the patient was administered the following to achieve and maintain moderate conscious sedation: Versed 2 mg, Fentanyl 25 mcg, while the patient's heart rate, blood pressure, and oxygen saturation were continuously monitored. The period of conscious sedation was 26 minutes, of which I was present face-to-face 100% of this time.    Coronary Findings   Dominance: Right  Left Anterior Descending  There is mild the vessel.  Left Circumflex  Vessel is angiographically normal.  Right Coronary Artery  The vessel exhibits minimal luminal irregularities.  Coronary Diagrams   Diagnostic Diagram     Implants        No implant documentation for this case.  PACS Images   Show images for Cardiac catheterization   Link to Procedure Log   Procedure Log    Hemo Data   Flowsheet Row Most Recent Value  Fick Cardiac Output 7.41 L/min  Fick Cardiac Output Index 3.55 (L/min)/BSA  RA A Wave 9 mmHg  RA V Wave 6 mmHg  RA Mean 5 mmHg  RV Systolic Pressure 30 mmHg  RV Diastolic Pressure 2 mmHg  RV EDP 8 mmHg  PA Systolic Pressure 28 mmHg  PA Diastolic Pressure 11 mmHg  PA Mean 18 mmHg  PW A Wave 14 mmHg  PW V Wave 10 mmHg  PW Mean 11 mmHg  AO Systolic Pressure 915 mmHg  AO Diastolic Pressure 74 mmHg  AO Mean 96 mmHg  QP/QS 1  TPVR Index 5.08 HRUI  TSVR Index 27.09 HRUI  PVR SVR Ratio 0.08  TPVR/TSVR Ratio 0.19      Impression:  Patient has stage D severe symptomatic aortic stenosis. She presents with symptoms of chest pain that are somewhat atypical and occur both with activity and at rest but possibly consistent with angina pectoris. She also describes stable symptoms of mild exertional shortness breath consistent  with chronic diastolic congestive heart failure, New York Heart Association functional class II.  In addition, she has been having intermittent dizzy spells without syncope. I have personally reviewed the patient's recent transthoracic echocardiogram, CT angiogram and cardiac catheterization. The patient's aortic valve appears to be functionally bicuspid with fusion of the left and right cusps of the valve. There is moderate thickening, calcification, and restricted leaflet mobility involving the fused leaflets in particular. The non-coronary leaflet appears to move a little bit better. He velocity across the aortic valve was measured well above 4 m/s corresponding to mean transvalvular gradient greater than 40 mmHg. Left ventricular systolic function remains normal.  Cardiac gated CT angiogram confirms the presence of bicuspid aortic valve morphology with no complicating features. The overall size of the aortic annulus and aortic root is normal.  Diagnostic cardiac catheterization is notable for the absence of significant coronary artery disease. I agree the patient should undergo aortic valve replacement. Risks associated with conventional surgery may be relatively low  but mildly elevated because of the patient's obesity and comorbid medical problems.   Plan:  The patient and her husband were again counseled at length regarding treatment alternatives for management of severe aortic stenosis including continued medical therapy versus proceeding with aortic valve replacement in the near future.  The natural history of aortic stenosis was reviewed, as was long term prognosis with medical therapy alone.  Surgical options were discussed at length including conventional surgical aortic valve replacement through either a full median sternotomy or using minimally invasive techniques.  Other alternatives including rapid-deployment bioprosthetic tissue valve replacement, transcatheter aortic valve replacement, patch  enlargement of the aortic root, stentless porcine aortic root replacement, valve repair, the Ross autograft procedure, and homograft aortic root replacement were discussed.  Discussion was held comparing the relative risks of mechanical valve replacement with need for lifelong anticoagulation versus use of a bioprosthetic tissue valve and the associated potential for late structural valve deterioration and failure.  This discussion was placed in the context of the patient's particular circumstances, and as a result the patient specifically requests that their valve be replaced using a bioprosthetic tissue valve.  The potential advantages and disadvantages associated with use of a rapid-deployment bioprosthetic aortic valve were discussed, including the risks of paravalvular leak, need for permanent pacemaker placement, and expectations for long-term durability.  Alternative surgical approaches of been discussed and the patient has expressed interest in minimally invasive approach for surgery. Because of her morbid obesity and associated body habitus I would be reluctant to consider the patient a candidate for mini thoracotomy approach. However, she may do quite well with partial upper median sternotomy. The patient understands and accepts all potential associated risks of surgery including but not limited to risk of death, stroke, myocardial infarction, congestive heart failure, respiratory failure, renal failure, pneumonia, bleeding requiring blood transfusion and or reexploration, arrhythmia, heart block or bradycardia requiring permanent pacemaker, aortic dissection or other major vascular complication, pleural effusions or other delayed complications related to continued congestive heart failure, and other late complications related to valve replacement including structural valve deterioration and failure, thrombosis, endocarditis, or paravalvular leak.  All of her questions have been addressed. We plan to  proceed with surgery on 08/23/2016. The patient has been instructed to stop taking aspirin between now and the time of surgery.   I spent in excess of 30 minutes during the conduct of this office consultation and >50% of this time involved direct face-to-face encounter with the patient for counseling and/or coordination of their care.    Valentina Gu. Roxy Manns, MD 08/12/2016 1:04 PM

## 2016-08-21 ENCOUNTER — Ambulatory Visit (HOSPITAL_BASED_OUTPATIENT_CLINIC_OR_DEPARTMENT_OTHER)
Admission: RE | Admit: 2016-08-21 | Discharge: 2016-08-21 | Disposition: A | Discharge status: Home / Self Care | Payer: Medicare Other | Source: Ambulatory Visit | Attending: Thoracic Surgery (Cardiothoracic Vascular Surgery) | Admitting: Thoracic Surgery (Cardiothoracic Vascular Surgery)

## 2016-08-21 ENCOUNTER — Ambulatory Visit (HOSPITAL_COMMUNITY)
Admission: RE | Admit: 2016-08-21 | Discharge: 2016-08-21 | Disposition: A | Discharge status: Home / Self Care | Payer: Medicare Other | Source: Ambulatory Visit | Attending: Thoracic Surgery (Cardiothoracic Vascular Surgery) | Admitting: Thoracic Surgery (Cardiothoracic Vascular Surgery)

## 2016-08-21 ENCOUNTER — Encounter (HOSPITAL_COMMUNITY): Payer: Self-pay

## 2016-08-21 DIAGNOSIS — Z01811 Encounter for preprocedural respiratory examination: Secondary | ICD-10-CM | POA: Insufficient documentation

## 2016-08-21 DIAGNOSIS — I771 Stricture of artery: Secondary | ICD-10-CM

## 2016-08-21 DIAGNOSIS — Z01812 Encounter for preprocedural laboratory examination: Secondary | ICD-10-CM | POA: Insufficient documentation

## 2016-08-21 DIAGNOSIS — I350 Nonrheumatic aortic (valve) stenosis: Secondary | ICD-10-CM | POA: Diagnosis not present

## 2016-08-21 DIAGNOSIS — I700 Atherosclerosis of aorta: Secondary | ICD-10-CM | POA: Insufficient documentation

## 2016-08-21 DIAGNOSIS — Z01818 Encounter for other preprocedural examination: Secondary | ICD-10-CM | POA: Insufficient documentation

## 2016-08-21 HISTORY — DX: Cardiac murmur, unspecified: R01.1

## 2016-08-21 HISTORY — DX: Sleep apnea, unspecified: G47.30

## 2016-08-21 HISTORY — DX: Headache, unspecified: R51.9

## 2016-08-21 HISTORY — DX: Headache: R51

## 2016-08-21 LAB — VAS US DOPPLER PRE CABG
LEFT ECA DIAS: -7 cm/s
LEFT VERTEBRAL DIAS: -10 cm/s
Left CCA dist dias: 14 cm/s
Left CCA dist sys: 57 cm/s
Left CCA prox dias: 12 cm/s
Left CCA prox sys: 69 cm/s
Left ICA dist dias: -15 cm/s
Left ICA dist sys: -43 cm/s
Left ICA prox dias: -23 cm/s
Left ICA prox sys: -100 cm/s
RIGHT ECA DIAS: -9 cm/s
RIGHT VERTEBRAL DIAS: -9 cm/s
Right CCA prox dias: 15 cm/s
Right CCA prox sys: 66 cm/s
Right cca dist sys: -53 cm/s

## 2016-08-21 LAB — CBC
HCT: 37.4 % (ref 36.0–46.0)
Hemoglobin: 12.1 g/dL (ref 12.0–15.0)
MCH: 27.4 pg (ref 26.0–34.0)
MCHC: 32.4 g/dL (ref 30.0–36.0)
MCV: 84.8 fL (ref 78.0–100.0)
Platelets: 324 10*3/uL (ref 150–400)
RBC: 4.41 MIL/uL (ref 3.87–5.11)
RDW: 13.2 % (ref 11.5–15.5)
WBC: 8.3 10*3/uL (ref 4.0–10.5)

## 2016-08-21 LAB — PULMONARY FUNCTION TEST
DL/VA % pred: 93 %
DL/VA: 4.59 ml/min/mmHg/L
DLCO cor % pred: 76 %
DLCO cor: 19.46 ml/min/mmHg
DLCO unc % pred: 72 %
DLCO unc: 18.57 ml/min/mmHg
FEF 25-75 Post: 3.09 L/sec
FEF 25-75 Pre: 3.19 L/sec
FEF2575-%Change-Post: -3 %
FEF2575-%Pred-Post: 143 %
FEF2575-%Pred-Pre: 148 %
FEV1-%Change-Post: -1 %
FEV1-%Pred-Post: 102 %
FEV1-%Pred-Pre: 104 %
FEV1-Post: 2.55 L
FEV1-Pre: 2.59 L
FEV1FVC-%Change-Post: 0 %
FEV1FVC-%Pred-Pre: 109 %
FEV6-%Change-Post: -2 %
FEV6-%Pred-Post: 95 %
FEV6-%Pred-Pre: 98 %
FEV6-Post: 2.99 L
FEV6-Pre: 3.07 L
FEV6FVC-%Pred-Post: 104 %
FEV6FVC-%Pred-Pre: 104 %
FVC-%Change-Post: -2 %
FVC-%Pred-Post: 91 %
FVC-%Pred-Pre: 94 %
FVC-Post: 3 L
FVC-Pre: 3.07 L
Post FEV1/FVC ratio: 85 %
Post FEV6/FVC ratio: 100 %
Pre FEV1/FVC ratio: 84 %
Pre FEV6/FVC Ratio: 100 %
RV % pred: 115 %
RV: 2.47 L
TLC % pred: 103 %
TLC: 5.36 L

## 2016-08-21 LAB — BLOOD GAS, ARTERIAL
Acid-base deficit: 0.3 mmol/L (ref 0.0–2.0)
Bicarbonate: 22.5 mmol/L (ref 20.0–28.0)
Drawn by: 470591
FIO2: 21
O2 Saturation: 98.6 %
Patient temperature: 98.6
pCO2 arterial: 28.8 mmHg — ABNORMAL LOW (ref 32.0–48.0)
pH, Arterial: 7.505 — ABNORMAL HIGH (ref 7.350–7.450)
pO2, Arterial: 121 mmHg — ABNORMAL HIGH (ref 83.0–108.0)

## 2016-08-21 LAB — URINALYSIS, ROUTINE W REFLEX MICROSCOPIC
Bilirubin Urine: NEGATIVE
Glucose, UA: NEGATIVE mg/dL
Hgb urine dipstick: NEGATIVE
Ketones, ur: NEGATIVE mg/dL
Leukocytes, UA: NEGATIVE
Nitrite: NEGATIVE
Protein, ur: NEGATIVE mg/dL
Specific Gravity, Urine: 1.002 — ABNORMAL LOW (ref 1.005–1.030)
pH: 6 (ref 5.0–8.0)

## 2016-08-21 LAB — COMPREHENSIVE METABOLIC PANEL
ALT: 18 U/L (ref 14–54)
AST: 20 U/L (ref 15–41)
Albumin: 4.1 g/dL (ref 3.5–5.0)
Alkaline Phosphatase: 151 U/L — ABNORMAL HIGH (ref 38–126)
Anion gap: 10 (ref 5–15)
BUN: 11 mg/dL (ref 6–20)
CO2: 26 mmol/L (ref 22–32)
Calcium: 9.3 mg/dL (ref 8.9–10.3)
Chloride: 97 mmol/L — ABNORMAL LOW (ref 101–111)
Creatinine, Ser: 0.63 mg/dL (ref 0.44–1.00)
GFR calc Af Amer: >60 mL/min (ref >60)
GFR calc non Af Amer: >60 mL/min (ref >60)
Glucose, Bld: 124 mg/dL — ABNORMAL HIGH (ref 65–99)
Potassium: 4.4 mmol/L (ref 3.5–5.1)
Sodium: 133 mmol/L — ABNORMAL LOW (ref 135–145)
Total Bilirubin: 0.4 mg/dL (ref 0.3–1.2)
Total Protein: 6.9 g/dL (ref 6.5–8.1)

## 2016-08-21 LAB — TYPE AND SCREEN
ABO/RH(D): A POS
Antibody Screen: NEGATIVE

## 2016-08-21 LAB — APTT: aPTT: 27 seconds (ref 24–36)

## 2016-08-21 LAB — GLUCOSE, CAPILLARY: Glucose-Capillary: 126 mg/dL — ABNORMAL HIGH (ref 65–99)

## 2016-08-21 LAB — SURGICAL PCR SCREEN
MRSA, PCR: NEGATIVE
Staphylococcus aureus: NEGATIVE

## 2016-08-21 LAB — PROTIME-INR
INR: 0.96
Prothrombin Time: 12.8 seconds (ref 11.4–15.2)

## 2016-08-21 MED ORDER — ALBUTEROL SULFATE (2.5 MG/3ML) 0.083% IN NEBU
2.5000 mg | INHALATION_SOLUTION | Freq: Once | RESPIRATORY_TRACT | Status: AC
  Administered 2016-08-21: 2.5 mg via RESPIRATORY_TRACT

## 2016-08-21 NOTE — Progress Notes (Signed)
PCP is Dr. Garret Reddish Cardiologist is Dr Meda Coffee Reports that her last A1c was good, so she doesn't check her cbg's. Instructed her to bring her Cpap mask on the day of surgery. EKG noted from 08-02-16 Echo 07-05-16  stress test 01-05-15  card cath 08-02-16 Pt was instructred to stop aspirin, which she has.

## 2016-08-21 NOTE — Progress Notes (Signed)
Pre-op Cardiac Surgery  Carotid Findings:  Bilateral - No evidence of significant ICA stenosis. Vertebral artery flow is antegrade.  Upper Extremity Right Left  Brachial Pressures 167 Triphasic 167 Triphasic  Radial Waveforms Triphasic Triphasic  Ulnar Waveforms Triphasic Triphasic  Palmar Arch (Allen's Test) Abnormal Normal   Findings:  Right Doppler waveforms obliterate with radial compression and remain normal with ulnar compression. Left Doppler waveforms remained normal with both radial and ulnar compression.    [facility], [place] 08/21/2016 12:00 PM

## 2016-08-21 NOTE — Pre-Procedure Instructions (Addendum)
[name]  08/21/2016      [id number] - [place], [place] [address] [place] [address] Phone: [phone] Fax: [phone]  [facility] [address] [place], [place] [address] Phone: [phone] Fax: [facility] [id number] [place] [address] [place] [address] Phone: [phone] Fax: [phone]    Your procedure is scheduled on .March 2  Report to [facility] at 530 A.M.  Call this number if you have problems the morning of surgery:  [phone]   Remember:  Do not eat food or drink liquids after midnight.  Take these medicines the morning of surgery with A SIP OF WATER Flonase nasal spray, Atrovent nasal spray, Omeprazole (Prilosec), Venalfaxine XR (Effexor-XR), Reglan   Stop taking aspirin as directed by your Dr.  Stop taking Ibuprofen, Advil, Motrin, Aleve, Vitamins, BC's, goody's, Herbal medications, Vitamins, Relafen    How to Manage Your Diabetes Before and After Surgery  Why is it important to control my blood sugar before and after surgery? . Improving blood sugar levels before and after surgery helps healing and can limit problems. . A way of improving blood sugar control is eating a healthy diet by: o  Eating less sugar and carbohydrates o  Increasing activity/exercise o  Talking with your doctor about reaching your blood sugar goals . High blood sugars (greater than 180 mg/dL) can raise your risk of infections and slow your recovery, so you will need to focus on controlling your diabetes during the weeks before surgery. . Make sure that the doctor who takes care of your diabetes knows about your planned surgery including the date and location.  How do I manage my blood sugar before surgery? . Check your blood sugar at least 4 times a day, starting 2 days  before surgery, to make sure that the level is not too high or low. o Check your blood sugar the morning of your surgery when you wake up and every 2 hours until you get to the Short Stay unit. . If your blood sugar is less than 70 mg/dL, you will need to treat for low blood sugar: o Do not take insulin. o Treat a low blood sugar (less than 70 mg/dL) with  cup of clear juice (cranberry or apple), 4 glucose tablets, OR glucose gel. o Recheck blood sugar in 15 minutes after treatment (to make sure it is greater than 70 mg/dL). If your blood sugar is not greater than 70 mg/dL on recheck, call [phone] for further instructions. . Report your blood sugar to the short stay nurse when you get to Short Stay.  . If you are admitted to the hospital after surgery: o Your blood sugar will be checked by the staff and you will probably be given insulin after surgery (instead of oral diabetes medicines) to make sure you have good blood sugar levels. o The goal for blood sugar control after surgery is 80-180 mg/dL.     WHAT DO I DO ABOUT MY DIABETES MEDICATION?   [name] Do not take oral diabetes medicines (pills) the morning of surgery. Metformin (Glucophage)  . THE NIGHT BEFORE SURGERY, take ___________ units of ___________insulin.       [name] HE MORNING OF SURGERY, take _____________ units of __________insulin.  . The day of surgery, do not take other diabetes injectables, including Byetta (exenatide), Bydureon (exenatide  ER), Victoza (liraglutide), or Trulicity (dulaglutide).  . If your CBG is greater than 220 mg/dL, you may take  of your sliding scale (correction) dose of insulin.  Other Instructions:          Patient Signature:  Date:   Nurse Signature:  Date:   Reviewed and Endorsed by [facility] Patient Education Committee, August 2015  Do not wear jewelry, make-up or nail polish.  Do not wear lotions, powders, or perfumes, or deoderant.  Do not shave 48 hours prior to surgery.  Men  may shave face and neck.  Do not bring valuables to the hospital.  [facility] is not responsible for any belongings or valuables.  Contacts, dentures or bridgework may not be worn into surgery.  Leave your suitcase in the car.  After surgery it may be brought to your room.  For patients admitted to the hospital, discharge time will be determined by your treatment team.  Patients discharged the day of surgery will not be allowed to drive home.    Special instructions:  [facility] - Preparing for Surgery  Before surgery, you can play an important role.  Because skin is not sterile, your skin needs to be as free of germs as possible.  You can reduce the number of germs on you skin by washing with CHG (chlorahexidine gluconate) soap before surgery.  CHG is an antiseptic cleaner which kills germs and bonds with the skin to continue killing germs even after washing.  Please DO NOT use if you have an allergy to CHG or antibacterial soaps.  If your skin becomes reddened/irritated stop using the CHG and inform your nurse when you arrive at Short Stay.  Do not shave (including legs and underarms) for at least 48 hours prior to the first CHG shower.  You may shave your face.  Please follow these instructions carefully:   1.  Shower with CHG Soap the night before surgery and the morning of Surgery.  2.  If you choose to wash your hair, wash your hair first as usual with your       normal shampoo.  3.  After you shampoo, rinse your hair and body thoroughly to remove the                      Shampoo.  4.  Use CHG as you would any other liquid soap.  You can apply chg directly       to the skin and wash gently with scrungie or a clean washcloth.  5.  Apply the CHG Soap to your body ONLY FROM THE NECK DOWN.        Do not use on open wounds or open sores.  Avoid contact with your eyes,       ears, mouth and genitals (private parts).  Wash genitals (private parts)       with your normal soap.  6.  Wash  thoroughly, paying special attention to the area where your surgery        will be performed.  7.  Thoroughly rinse your body with warm water from the neck down.  8.  DO NOT shower/wash with your normal soap after using and rinsing off       the CHG Soap.  9.  Pat yourself dry with a clean towel.            10.  Wear clean pajamas.  11.  Place clean sheets on your bed the night of your first shower and do not        sleep with pets.  Day of Surgery  Do not apply any lotions/deoderants the morning of surgery.  Please wear clean clothes to the hospital/surgery center.     Please read over the following fact sheets that you were given. Pain Booklet, Coughing and Deep Breathing, Surgical Site Infection Prevention and Anesthesia Post-op Instructions

## 2016-08-22 ENCOUNTER — Encounter (HOSPITAL_COMMUNITY): Payer: Self-pay | Admitting: Certified Registered Nurse Anesthetist

## 2016-08-22 LAB — HEMOGLOBIN A1C
Hgb A1c MFr Bld: 6.6 % — ABNORMAL HIGH (ref 4.8–5.6)
Mean Plasma Glucose: 143 mg/dL

## 2016-08-22 MED ORDER — CHLORHEXIDINE GLUCONATE 0.12 % MT SOLN
15.0000 mL | Freq: Once | OROMUCOSAL | Status: AC
  Administered 2016-08-23: 15 mL via OROMUCOSAL
  Filled 2016-08-22: qty 15

## 2016-08-22 MED ORDER — MANNITOL 20 % IV SOLN
6.4000 g | INTRAVENOUS | Status: DC
  Filled 2016-08-22: qty 500

## 2016-08-22 MED ORDER — POTASSIUM CHLORIDE 2 MEQ/ML IV SOLN
80.0000 meq | INTRAVENOUS | Status: DC
  Filled 2016-08-22: qty 40

## 2016-08-22 MED ORDER — DOPAMINE-DEXTROSE 3.2-5 MG/ML-% IV SOLN
0.0000 ug/kg/min | INTRAVENOUS | Status: DC
  Filled 2016-08-22: qty 250

## 2016-08-22 MED ORDER — VANCOMYCIN HCL 10 G IV SOLR
1500.0000 mg | INTRAVENOUS | Status: AC
  Administered 2016-08-23: 1500 mg via INTRAVENOUS
  Filled 2016-08-22: qty 1500

## 2016-08-22 MED ORDER — LEVOFLOXACIN IN D5W 500 MG/100ML IV SOLN
500.0000 mg | INTRAVENOUS | Status: AC
  Administered 2016-08-23: 500 mg via INTRAVENOUS
  Filled 2016-08-22: qty 100

## 2016-08-22 MED ORDER — NITROGLYCERIN IN D5W 200-5 MCG/ML-% IV SOLN
2.0000 ug/min | INTRAVENOUS | Status: DC
  Filled 2016-08-22: qty 250

## 2016-08-22 MED ORDER — SODIUM CHLORIDE 0.9 % IV SOLN
INTRAVENOUS | Status: AC
  Administered 2016-08-23: 1 [IU]/h via INTRAVENOUS
  Filled 2016-08-22: qty 2.5

## 2016-08-22 MED ORDER — MANNITOL 20 % IV SOLN: 6.4000 g | Status: DC

## 2016-08-22 MED ORDER — MAGNESIUM SULFATE 50 % IJ SOLN
40.0000 meq | INTRAMUSCULAR | Status: DC
  Filled 2016-08-22: qty 10

## 2016-08-22 MED ORDER — SODIUM CHLORIDE 0.9 % IV SOLN
INTRAVENOUS | Status: DC
  Filled 2016-08-22: qty 30

## 2016-08-22 MED ORDER — TRANEXAMIC ACID 1000 MG/10ML IV SOLN
1.5000 mg/kg/h | INTRAVENOUS | Status: AC
  Administered 2016-08-23: 1.5 mg/kg/h via INTRAVENOUS
  Filled 2016-08-22: qty 25

## 2016-08-22 MED ORDER — LIDOCAINE HCL (CARDIAC) 20 MG/ML IV SOLN
260.0000 mg | INTRAVENOUS | Status: AC
  Administered 2016-08-23: 80 mg via INTRAVENOUS
  Filled 2016-08-22: qty 15

## 2016-08-22 MED ORDER — DEXMEDETOMIDINE HCL IN NACL 400 MCG/100ML IV SOLN
0.1000 ug/kg/h | INTRAVENOUS | Status: AC
  Administered 2016-08-23: .3 ug/kg/h via INTRAVENOUS
  Filled 2016-08-22: qty 100

## 2016-08-22 MED ORDER — TRANEXAMIC ACID (OHS) PUMP PRIME SOLUTION
2.0000 mg/kg | INTRAVENOUS | Status: DC
  Filled 2016-08-22: qty 2.05

## 2016-08-22 MED ORDER — METOPROLOL TARTRATE 12.5 MG HALF TABLET
12.5000 mg | ORAL_TABLET | Freq: Once | ORAL | Status: AC
  Administered 2016-08-23: 12.5 mg via ORAL
  Filled 2016-08-22: qty 1

## 2016-08-22 MED ORDER — SODIUM CHLORIDE 0.9 % IV SOLN
30.0000 ug/min | INTRAVENOUS | Status: AC
  Administered 2016-08-23: 25 ug/min via INTRAVENOUS
  Filled 2016-08-22: qty 2

## 2016-08-22 MED ORDER — TRANEXAMIC ACID (OHS) BOLUS VIA INFUSION
15.0000 mg/kg | INTRAVENOUS | Status: AC
  Administered 2016-08-23: 1536 mg via INTRAVENOUS
  Filled 2016-08-22: qty 1536

## 2016-08-22 MED ORDER — EPINEPHRINE PF 1 MG/ML IJ SOLN
0.0000 ug/min | INTRAMUSCULAR | Status: DC
  Filled 2016-08-22: qty 4

## 2016-08-22 MED ORDER — VANCOMYCIN HCL 1000 MG IV SOLR
INTRAVENOUS | Status: AC
  Administered 2016-08-23: 1000 mL
  Filled 2016-08-22: qty 1000

## 2016-08-22 MED ORDER — PLASMA-LYTE 148 IV SOLN
INTRAVENOUS | Status: DC
  Filled 2016-08-22: qty 2.5

## 2016-08-23 ENCOUNTER — Inpatient Hospital Stay (HOSPITAL_COMMUNITY): Payer: Medicare Other | Admitting: Certified Registered Nurse Anesthetist

## 2016-08-23 ENCOUNTER — Inpatient Hospital Stay (HOSPITAL_COMMUNITY): Payer: Medicare Other

## 2016-08-23 ENCOUNTER — Encounter (HOSPITAL_COMMUNITY)
Admission: RE | Disposition: A | Discharge status: Home / Self Care | Payer: Self-pay | Source: Ambulatory Visit | Attending: Thoracic Surgery (Cardiothoracic Vascular Surgery)

## 2016-08-23 ENCOUNTER — Encounter (HOSPITAL_COMMUNITY): Payer: Self-pay | Admitting: *Deleted

## 2016-08-23 ENCOUNTER — Inpatient Hospital Stay (HOSPITAL_COMMUNITY)
Admission: RE | Admit: 2016-08-23 | Discharge: 2016-08-27 | DRG: 219 | Disposition: A | Discharge status: Home / Self Care | Payer: Medicare Other | Source: Ambulatory Visit | Attending: Thoracic Surgery (Cardiothoracic Vascular Surgery) | Admitting: Thoracic Surgery (Cardiothoracic Vascular Surgery)

## 2016-08-23 DIAGNOSIS — Z96651 Presence of right artificial knee joint: Secondary | ICD-10-CM | POA: Diagnosis present

## 2016-08-23 DIAGNOSIS — Z6836 Body mass index (BMI) 36.0-36.9, adult: Secondary | ICD-10-CM | POA: Diagnosis not present

## 2016-08-23 DIAGNOSIS — F411 Generalized anxiety disorder: Secondary | ICD-10-CM | POA: Diagnosis present

## 2016-08-23 DIAGNOSIS — J45909 Unspecified asthma, uncomplicated: Secondary | ICD-10-CM | POA: Diagnosis present

## 2016-08-23 DIAGNOSIS — K219 Gastro-esophageal reflux disease without esophagitis: Secondary | ICD-10-CM | POA: Diagnosis present

## 2016-08-23 DIAGNOSIS — Z7982 Long term (current) use of aspirin: Secondary | ICD-10-CM

## 2016-08-23 DIAGNOSIS — Z7984 Long term (current) use of oral hypoglycemic drugs: Secondary | ICD-10-CM

## 2016-08-23 DIAGNOSIS — Z79899 Other long term (current) drug therapy: Secondary | ICD-10-CM | POA: Diagnosis not present

## 2016-08-23 DIAGNOSIS — I2510 Atherosclerotic heart disease of native coronary artery without angina pectoris: Secondary | ICD-10-CM | POA: Diagnosis present

## 2016-08-23 DIAGNOSIS — Q231 Congenital insufficiency of aortic valve: Secondary | ICD-10-CM

## 2016-08-23 DIAGNOSIS — Z952 Presence of prosthetic heart valve: Secondary | ICD-10-CM

## 2016-08-23 DIAGNOSIS — E119 Type 2 diabetes mellitus without complications: Secondary | ICD-10-CM

## 2016-08-23 DIAGNOSIS — I350 Nonrheumatic aortic (valve) stenosis: Secondary | ICD-10-CM | POA: Diagnosis present

## 2016-08-23 DIAGNOSIS — E785 Hyperlipidemia, unspecified: Secondary | ICD-10-CM | POA: Diagnosis present

## 2016-08-23 DIAGNOSIS — Z880 Allergy status to penicillin: Secondary | ICD-10-CM | POA: Diagnosis not present

## 2016-08-23 DIAGNOSIS — I5189 Other ill-defined heart diseases: Secondary | ICD-10-CM | POA: Diagnosis present

## 2016-08-23 DIAGNOSIS — Z953 Presence of xenogenic heart valve: Secondary | ICD-10-CM

## 2016-08-23 DIAGNOSIS — I5033 Acute on chronic diastolic (congestive) heart failure: Secondary | ICD-10-CM | POA: Diagnosis not present

## 2016-08-23 DIAGNOSIS — D62 Acute posthemorrhagic anemia: Secondary | ICD-10-CM | POA: Diagnosis not present

## 2016-08-23 DIAGNOSIS — J9811 Atelectasis: Secondary | ICD-10-CM

## 2016-08-23 DIAGNOSIS — G4733 Obstructive sleep apnea (adult) (pediatric): Secondary | ICD-10-CM | POA: Diagnosis present

## 2016-08-23 HISTORY — DX: Chronic diastolic (congestive) heart failure: I50.32

## 2016-08-23 HISTORY — PX: TEE WITHOUT CARDIOVERSION: SHX5443

## 2016-08-23 HISTORY — PX: AORTIC VALVE REPLACEMENT: SHX41

## 2016-08-23 HISTORY — DX: Presence of xenogenic heart valve: Z95.3

## 2016-08-23 LAB — GLUCOSE, CAPILLARY
Glucose-Capillary: 101 mg/dL — ABNORMAL HIGH (ref 65–99)
Glucose-Capillary: 107 mg/dL — ABNORMAL HIGH (ref 65–99)
Glucose-Capillary: 110 mg/dL — ABNORMAL HIGH (ref 65–99)
Glucose-Capillary: 126 mg/dL — ABNORMAL HIGH (ref 65–99)
Glucose-Capillary: 101 mg/dL — ABNORMAL HIGH (ref 65–99)
Glucose-Capillary: 123 mg/dL — ABNORMAL HIGH (ref 65–99)
Glucose-Capillary: 117 mg/dL — ABNORMAL HIGH (ref 65–99)
Glucose-Capillary: 97 mg/dL (ref 65–99)
Glucose-Capillary: 148 mg/dL — ABNORMAL HIGH (ref 65–99)

## 2016-08-23 LAB — POCT I-STAT, CHEM 8
BUN: 13 mg/dL (ref 6–20)
BUN: 11 mg/dL (ref 6–20)
BUN: 12 mg/dL (ref 6–20)
BUN: 11 mg/dL (ref 6–20)
BUN: 11 mg/dL (ref 6–20)
BUN: 9 mg/dL (ref 6–20)
Calcium, Ion: 1.15 mmol/L (ref 1.15–1.40)
Calcium, Ion: 0.99 mmol/L — ABNORMAL LOW (ref 1.15–1.40)
Calcium, Ion: 1.17 mmol/L (ref 1.15–1.40)
Calcium, Ion: 1.04 mmol/L — ABNORMAL LOW (ref 1.15–1.40)
Calcium, Ion: 1.08 mmol/L — ABNORMAL LOW (ref 1.15–1.40)
Calcium, Ion: 1.08 mmol/L — ABNORMAL LOW (ref 1.15–1.40)
Chloride: 100 mmol/L — ABNORMAL LOW (ref 101–111)
Chloride: 101 mmol/L (ref 101–111)
Chloride: 98 mmol/L — ABNORMAL LOW (ref 101–111)
Chloride: 100 mmol/L — ABNORMAL LOW (ref 101–111)
Chloride: 101 mmol/L (ref 101–111)
Chloride: 105 mmol/L (ref 101–111)
Creatinine, Ser: 0.4 mg/dL — ABNORMAL LOW (ref 0.44–1.00)
Creatinine, Ser: 0.4 mg/dL — ABNORMAL LOW (ref 0.44–1.00)
Creatinine, Ser: 0.6 mg/dL (ref 0.44–1.00)
Creatinine, Ser: 0.4 mg/dL — ABNORMAL LOW (ref 0.44–1.00)
Creatinine, Ser: 0.4 mg/dL — ABNORMAL LOW (ref 0.44–1.00)
Creatinine, Ser: 0.4 mg/dL — ABNORMAL LOW (ref 0.44–1.00)
Glucose, Bld: 125 mg/dL — ABNORMAL HIGH (ref 65–99)
Glucose, Bld: 128 mg/dL — ABNORMAL HIGH (ref 65–99)
Glucose, Bld: 131 mg/dL — ABNORMAL HIGH (ref 65–99)
Glucose, Bld: 154 mg/dL — ABNORMAL HIGH (ref 65–99)
Glucose, Bld: 200 mg/dL — ABNORMAL HIGH (ref 65–99)
Glucose, Bld: 126 mg/dL — ABNORMAL HIGH (ref 65–99)
HCT: 33 % — ABNORMAL LOW (ref 36.0–46.0)
HCT: 23 % — ABNORMAL LOW (ref 36.0–46.0)
HCT: 28 % — ABNORMAL LOW (ref 36.0–46.0)
HCT: 23 % — ABNORMAL LOW (ref 36.0–46.0)
HCT: 23 % — ABNORMAL LOW (ref 36.0–46.0)
HCT: 29 % — ABNORMAL LOW (ref 36.0–46.0)
Hemoglobin: 11.2 g/dL — ABNORMAL LOW (ref 12.0–15.0)
Hemoglobin: 7.8 g/dL — ABNORMAL LOW (ref 12.0–15.0)
Hemoglobin: 9.5 g/dL — ABNORMAL LOW (ref 12.0–15.0)
Hemoglobin: 7.8 g/dL — ABNORMAL LOW (ref 12.0–15.0)
Hemoglobin: 7.8 g/dL — ABNORMAL LOW (ref 12.0–15.0)
Hemoglobin: 9.9 g/dL — ABNORMAL LOW (ref 12.0–15.0)
Potassium: 4.2 mmol/L (ref 3.5–5.1)
Potassium: 3.7 mmol/L (ref 3.5–5.1)
Potassium: 4 mmol/L (ref 3.5–5.1)
Potassium: 4.1 mmol/L (ref 3.5–5.1)
Potassium: 4.4 mmol/L (ref 3.5–5.1)
Potassium: 3.9 mmol/L (ref 3.5–5.1)
Sodium: 137 mmol/L (ref 135–145)
Sodium: 136 mmol/L (ref 135–145)
Sodium: 138 mmol/L (ref 135–145)
Sodium: 134 mmol/L — ABNORMAL LOW (ref 135–145)
Sodium: 138 mmol/L (ref 135–145)
Sodium: 141 mmol/L (ref 135–145)
TCO2: 28 mmol/L (ref 0–100)
TCO2: 25 mmol/L (ref 0–100)
TCO2: 26 mmol/L (ref 0–100)
TCO2: 22 mmol/L (ref 0–100)
TCO2: 25 mmol/L (ref 0–100)
TCO2: 23 mmol/L (ref 0–100)

## 2016-08-23 LAB — CBC
HCT: 29 % — ABNORMAL LOW (ref 36.0–46.0)
HCT: 32.1 % — ABNORMAL LOW (ref 36.0–46.0)
Hemoglobin: 9.4 g/dL — ABNORMAL LOW (ref 12.0–15.0)
Hemoglobin: 10.2 g/dL — ABNORMAL LOW (ref 12.0–15.0)
MCH: 27.7 pg (ref 26.0–34.0)
MCH: 27.1 pg (ref 26.0–34.0)
MCHC: 32.4 g/dL (ref 30.0–36.0)
MCHC: 31.8 g/dL (ref 30.0–36.0)
MCV: 85.5 fL (ref 78.0–100.0)
MCV: 85.4 fL (ref 78.0–100.0)
Platelets: 172 10*3/uL (ref 150–400)
Platelets: 177 10*3/uL (ref 150–400)
RBC: 3.39 MIL/uL — ABNORMAL LOW (ref 3.87–5.11)
RBC: 3.76 MIL/uL — ABNORMAL LOW (ref 3.87–5.11)
RDW: 13.3 % (ref 11.5–15.5)
RDW: 13.4 % (ref 11.5–15.5)
WBC: 18 10*3/uL — ABNORMAL HIGH (ref 4.0–10.5)
WBC: 14.4 10*3/uL — ABNORMAL HIGH (ref 4.0–10.5)

## 2016-08-23 LAB — ECHO TEE
AO mean calculated velocity dopler: 185 cm/s
AV Area VTI index: 0.58 cm2/m2
AV Area VTI: 1.09 cm2
AV Area mean vel: 1.09 cm2
AV Mean grad: 18 mmHg
AV Peak grad: 26 mmHg
AV VEL mean LVOT/AV: 0.26
AV area mean vel ind: 0.52 cm2/m2
AV peak Index: 0.52
AV pk vel: 255 cm/s
AV vel: 1.21
Ao pk vel: 0.26 m/s
LVOT MV VTI INDEX: 1.32 cm2/m2
LVOT MV VTI: 2.75
LVOT SV: 72 mL
LVOT VTI: 17.4 cm
LVOT area: 4.15 cm2
LVOT diameter: 23 mm
LVOT peak VTI: 0.29 cm
LVOT peak vel: 67.1 cm/s
MV Annulus VTI: 26.3 cm
MV M vel: 47.6
Mean grad: 1 mmHg
VTI: 59.7 cm
Valve area index: 0.58
Valve area: 1.21 cm2

## 2016-08-23 LAB — POCT I-STAT 4, (NA,K, GLUC, HGB,HCT)
Glucose, Bld: 133 mg/dL — ABNORMAL HIGH (ref 65–99)
HCT: 26 % — ABNORMAL LOW (ref 36.0–46.0)
Hemoglobin: 8.8 g/dL — ABNORMAL LOW (ref 12.0–15.0)
Potassium: 3.9 mmol/L (ref 3.5–5.1)
Sodium: 139 mmol/L (ref 135–145)

## 2016-08-23 LAB — POCT I-STAT 3, ART BLOOD GAS (G3+)
Acid-Base Excess: 2 mmol/L (ref 0.0–2.0)
Acid-base deficit: 2 mmol/L (ref 0.0–2.0)
Acid-base deficit: 22 mmol/L — ABNORMAL HIGH (ref 0.0–2.0)
Acid-base deficit: 4 mmol/L — ABNORMAL HIGH (ref 0.0–2.0)
Bicarbonate: 25.5 mmol/L (ref 20.0–28.0)
Bicarbonate: 24.1 mmol/L (ref 20.0–28.0)
Bicarbonate: 9.4 mmol/L — ABNORMAL LOW (ref 20.0–28.0)
Bicarbonate: 25.4 mmol/L (ref 20.0–28.0)
Bicarbonate: 22.3 mmol/L (ref 20.0–28.0)
O2 Saturation: 100 %
O2 Saturation: 89 %
O2 Saturation: 96 %
O2 Saturation: 96 %
O2 Saturation: 90 %
TCO2: 27 mmol/L (ref 0–100)
TCO2: 11 mmol/L (ref 0–100)
TCO2: 26 mmol/L (ref 0–100)
TCO2: 27 mmol/L (ref 0–100)
TCO2: 24 mmol/L (ref 0–100)
pCO2 arterial: 34.8 mmHg (ref 32.0–48.0)
pCO2 arterial: 43.9 mmHg (ref 32.0–48.0)
pCO2 arterial: 47.2 mmHg (ref 32.0–48.0)
pCO2 arterial: 44.8 mmHg (ref 32.0–48.0)
pCO2 arterial: 47.1 mmHg (ref 32.0–48.0)
pH, Arterial: 7.473 — ABNORMAL HIGH (ref 7.350–7.450)
pH, Arterial: 6.937 — CL (ref 7.350–7.450)
pH, Arterial: 7.317 — ABNORMAL LOW (ref 7.350–7.450)
pH, Arterial: 7.362 (ref 7.350–7.450)
pH, Arterial: 7.283 — ABNORMAL LOW (ref 7.350–7.450)
pO2, Arterial: 408 mmHg — ABNORMAL HIGH (ref 83.0–108.0)
pO2, Arterial: 87 mmHg (ref 83.0–108.0)
pO2, Arterial: 89 mmHg (ref 83.0–108.0)
pO2, Arterial: 84 mmHg (ref 83.0–108.0)
pO2, Arterial: 67 mmHg — ABNORMAL LOW (ref 83.0–108.0)

## 2016-08-23 LAB — CREATININE, SERUM
Creatinine, Ser: 0.51 mg/dL (ref 0.44–1.00)
GFR calc Af Amer: >60 mL/min (ref >60)
GFR calc non Af Amer: >60 mL/min (ref >60)

## 2016-08-23 LAB — MAGNESIUM: Magnesium: 2.7 mg/dL — ABNORMAL HIGH (ref 1.7–2.4)

## 2016-08-23 LAB — APTT: aPTT: 37 seconds — ABNORMAL HIGH (ref 24–36)

## 2016-08-23 LAB — PLATELET COUNT: Platelets: 256 10*3/uL (ref 150–400)

## 2016-08-23 LAB — HEMOGLOBIN AND HEMATOCRIT, BLOOD
HCT: 23.5 % — ABNORMAL LOW (ref 36.0–46.0)
Hemoglobin: 7.7 g/dL — ABNORMAL LOW (ref 12.0–15.0)

## 2016-08-23 LAB — PROTIME-INR
INR: 1.49
Prothrombin Time: 18.2 seconds — ABNORMAL HIGH (ref 11.4–15.2)

## 2016-08-23 SURGERY — ECHOCARDIOGRAM, TRANSESOPHAGEAL
Anesthesia: General | Site: Chest

## 2016-08-23 MED ORDER — MIDAZOLAM HCL 5 MG/5ML IJ SOLN
INTRAMUSCULAR | Status: DC | PRN
  Administered 2016-08-23: 2 mg via INTRAVENOUS
  Administered 2016-08-23 (×2): 1 mg via INTRAVENOUS

## 2016-08-23 MED ORDER — ORAL CARE MOUTH RINSE
15.0000 mL | Freq: Four times a day (QID) | OROMUCOSAL | Status: DC
  Administered 2016-08-23 – 2016-08-24 (×4): 15 mL via OROMUCOSAL

## 2016-08-23 MED ORDER — SODIUM CHLORIDE 0.9 % IJ SOLN
INTRAMUSCULAR | Status: DC | PRN
  Administered 2016-08-23 (×2): 4 mL via TOPICAL

## 2016-08-23 MED ORDER — PROPOFOL 10 MG/ML IV BOLUS
INTRAVENOUS | Status: AC
  Filled 2016-08-23: qty 20

## 2016-08-23 MED ORDER — LACTATED RINGERS IV SOLN
INTRAVENOUS | Status: DC | PRN
  Administered 2016-08-23: 07:00:00 via INTRAVENOUS

## 2016-08-23 MED ORDER — DEXMEDETOMIDINE HCL IN NACL 200 MCG/50ML IV SOLN
0.0000 ug/kg/h | INTRAVENOUS | Status: DC
  Administered 2016-08-23 (×2): 0.7 ug/kg/h via INTRAVENOUS
  Filled 2016-08-23: qty 50

## 2016-08-23 MED ORDER — TRAMADOL HCL 50 MG PO TABS
50.0000 mg | ORAL_TABLET | ORAL | Status: DC | PRN
  Administered 2016-08-24: 100 mg via ORAL
  Filled 2016-08-23: qty 2

## 2016-08-23 MED ORDER — LACTATED RINGERS IV SOLN: INTRAVENOUS | Status: DC

## 2016-08-23 MED ORDER — BISACODYL 10 MG RE SUPP
10.0000 mg | Freq: Every day | RECTAL | Status: DC
Start: 2016-08-24 — End: 2016-08-27

## 2016-08-23 MED ORDER — BISACODYL 5 MG PO TBEC
10.0000 mg | DELAYED_RELEASE_TABLET | Freq: Every day | ORAL | Status: DC
  Administered 2016-08-24 – 2016-08-27 (×4): 10 mg via ORAL
  Filled 2016-08-23 (×4): qty 2

## 2016-08-23 MED ORDER — LACTATED RINGERS IV SOLN
INTRAVENOUS | Status: DC
  Administered 2016-08-23 – 2016-08-25 (×3): via INTRAVENOUS

## 2016-08-23 MED ORDER — SODIUM BICARBONATE 8.4 % IV SOLN
50.0000 meq | Freq: Once | INTRAVENOUS | Status: AC
  Administered 2016-08-23: 50 meq via INTRAVENOUS

## 2016-08-23 MED ORDER — ALBUMIN HUMAN 5 % IV SOLN: 250.0000 mL | INTRAVENOUS | Status: AC | PRN

## 2016-08-23 MED ORDER — KETOROLAC TROMETHAMINE 15 MG/ML IJ SOLN
15.0000 mg | Freq: Once | INTRAMUSCULAR | Status: AC
  Administered 2016-08-23: 15 mg via INTRAVENOUS
  Filled 2016-08-23: qty 1

## 2016-08-23 MED ORDER — PHENYLEPHRINE HCL 10 MG/ML IJ SOLN
0.0000 ug/min | INTRAMUSCULAR | Status: DC
  Administered 2016-08-23 (×2): 20 ug/min via INTRAVENOUS
  Filled 2016-08-23: qty 2

## 2016-08-23 MED ORDER — MORPHINE SULFATE (PF) 2 MG/ML IV SOLN
1.0000 mg | INTRAVENOUS | Status: AC | PRN
  Filled 2016-08-23: qty 1

## 2016-08-23 MED ORDER — SODIUM CHLORIDE 0.9 % IV SOLN
INTRAVENOUS | Status: DC
  Administered 2016-08-23: 1.4 [IU]/h via INTRAVENOUS
  Administered 2016-08-23: 2.8 [IU]/h via INTRAVENOUS
  Filled 2016-08-23 (×2): qty 2.5

## 2016-08-23 MED ORDER — LACTATED RINGERS IV SOLN
INTRAVENOUS | Status: DC | PRN
  Administered 2016-08-23: 08:00:00 via INTRAVENOUS

## 2016-08-23 MED ORDER — INSULIN REGULAR BOLUS VIA INFUSION
0.0000 [IU] | Freq: Three times a day (TID) | INTRAVENOUS | Status: DC
  Filled 2016-08-23: qty 10

## 2016-08-23 MED ORDER — LEVOFLOXACIN IN D5W 750 MG/150ML IV SOLN
750.0000 mg | INTRAVENOUS | Status: AC
  Administered 2016-08-24: 750 mg via INTRAVENOUS
  Filled 2016-08-23: qty 150

## 2016-08-23 MED ORDER — SODIUM CHLORIDE 0.9 % IV SOLN
INTRAVENOUS | Status: AC
  Administered 2016-08-23: 13:00:00 via INTRAVENOUS

## 2016-08-23 MED ORDER — SODIUM CHLORIDE 0.9% FLUSH
3.0000 mL | Freq: Two times a day (BID) | INTRAVENOUS | Status: DC
  Administered 2016-08-24 – 2016-08-25 (×2): 3 mL via INTRAVENOUS

## 2016-08-23 MED ORDER — ASPIRIN 81 MG PO CHEW: 324.0000 mg | CHEWABLE_TABLET | Freq: Every day | ORAL | Status: DC

## 2016-08-23 MED ORDER — METOPROLOL TARTRATE 5 MG/5ML IV SOLN: 2.5000 mg | INTRAVENOUS | Status: DC | PRN

## 2016-08-23 MED ORDER — PROTAMINE SULFATE 10 MG/ML IV SOLN
INTRAVENOUS | Status: DC | PRN
  Administered 2016-08-23: 10 mg via INTRAVENOUS
  Administered 2016-08-23: 310 mg via INTRAVENOUS

## 2016-08-23 MED ORDER — ACETAMINOPHEN 650 MG RE SUPP
650.0000 mg | Freq: Once | RECTAL | Status: AC
  Administered 2016-08-23: 650 mg via RECTAL

## 2016-08-23 MED ORDER — HEPARIN SODIUM (PORCINE) 1000 UNIT/ML IJ SOLN
INTRAMUSCULAR | Status: DC | PRN
  Administered 2016-08-23: 35000 [IU] via INTRAVENOUS

## 2016-08-23 MED ORDER — PROPOFOL 10 MG/ML IV BOLUS
INTRAVENOUS | Status: DC | PRN
  Administered 2016-08-23 (×4): 10 mg via INTRAVENOUS

## 2016-08-23 MED ORDER — SODIUM CHLORIDE 0.9 % IV SOLN: 250.0000 mL | INTRAVENOUS | Status: DC

## 2016-08-23 MED ORDER — DOCUSATE SODIUM 100 MG PO CAPS
200.0000 mg | ORAL_CAPSULE | Freq: Every day | ORAL | Status: DC
  Administered 2016-08-24 – 2016-08-27 (×4): 200 mg via ORAL
  Filled 2016-08-23 (×4): qty 2

## 2016-08-23 MED ORDER — SODIUM CHLORIDE 0.45 % IV SOLN: INTRAVENOUS | Status: DC | PRN

## 2016-08-23 MED ORDER — SODIUM CHLORIDE 0.9% FLUSH: 3.0000 mL | INTRAVENOUS | Status: DC | PRN

## 2016-08-23 MED ORDER — LACTATED RINGERS IV SOLN: 500.0000 mL | Freq: Once | INTRAVENOUS | Status: DC | PRN

## 2016-08-23 MED ORDER — SODIUM CHLORIDE 0.9 % IV SOLN: 30.0000 meq | Freq: Once | INTRAVENOUS | Status: DC

## 2016-08-23 MED ORDER — CHLORHEXIDINE GLUCONATE 0.12% ORAL RINSE (MEDLINE KIT)
15.0000 mL | Freq: Two times a day (BID) | OROMUCOSAL | Status: DC
  Administered 2016-08-23: 15 mL via OROMUCOSAL

## 2016-08-23 MED ORDER — NITROGLYCERIN IN D5W 200-5 MCG/ML-% IV SOLN: 0.0000 ug/min | INTRAVENOUS | Status: DC

## 2016-08-23 MED ORDER — SODIUM CHLORIDE 0.9 % IV SOLN: INTRAVENOUS | Status: DC

## 2016-08-23 MED ORDER — METOPROLOL TARTRATE 12.5 MG HALF TABLET
12.5000 mg | ORAL_TABLET | Freq: Two times a day (BID) | ORAL | Status: DC
  Administered 2016-08-24 – 2016-08-27 (×5): 12.5 mg via ORAL
  Filled 2016-08-23 (×7): qty 1

## 2016-08-23 MED ORDER — 0.9 % SODIUM CHLORIDE (POUR BTL) OPTIME
TOPICAL | Status: DC | PRN
  Administered 2016-08-23: 5000 mL

## 2016-08-23 MED ORDER — SODIUM CHLORIDE 0.9 % IV SOLN
30.0000 meq | Freq: Once | INTRAVENOUS | Status: AC
  Administered 2016-08-23: 30 meq via INTRAVENOUS
  Filled 2016-08-23: qty 15

## 2016-08-23 MED ORDER — FENTANYL CITRATE (PF) 250 MCG/5ML IJ SOLN
INTRAMUSCULAR | Status: DC | PRN
  Administered 2016-08-23: 150 ug via INTRAVENOUS
  Administered 2016-08-23: 100 ug via INTRAVENOUS
  Administered 2016-08-23: 150 ug via INTRAVENOUS
  Administered 2016-08-23 (×2): 100 ug via INTRAVENOUS
  Administered 2016-08-23: 50 ug via INTRAVENOUS
  Administered 2016-08-23: 100 ug via INTRAVENOUS
  Administered 2016-08-23: 150 ug via INTRAVENOUS
  Administered 2016-08-23 (×2): 100 ug via INTRAVENOUS
  Administered 2016-08-23: 150 ug via INTRAVENOUS

## 2016-08-23 MED ORDER — ASPIRIN EC 325 MG PO TBEC
325.0000 mg | DELAYED_RELEASE_TABLET | Freq: Every day | ORAL | Status: DC
  Administered 2016-08-24 – 2016-08-25 (×2): 325 mg via ORAL
  Filled 2016-08-23 (×2): qty 1

## 2016-08-23 MED ORDER — MAGNESIUM SULFATE 4 GM/100ML IV SOLN
4.0000 g | Freq: Once | INTRAVENOUS | Status: AC
  Administered 2016-08-23: 4 g via INTRAVENOUS
  Filled 2016-08-23: qty 100

## 2016-08-23 MED ORDER — ACETAMINOPHEN 160 MG/5ML PO SOLN: 1000.0000 mg | Freq: Four times a day (QID) | ORAL | Status: DC

## 2016-08-23 MED ORDER — ACETAMINOPHEN 160 MG/5ML PO SOLN: 650.0000 mg | Freq: Once | ORAL | Status: AC

## 2016-08-23 MED ORDER — ONDANSETRON HCL 4 MG/2ML IJ SOLN
4.0000 mg | Freq: Four times a day (QID) | INTRAMUSCULAR | Status: DC | PRN
  Administered 2016-08-26 – 2016-08-27 (×4): 4 mg via INTRAVENOUS
  Filled 2016-08-23 (×4): qty 2

## 2016-08-23 MED ORDER — LACTATED RINGERS IV SOLN
INTRAVENOUS | Status: DC | PRN
  Administered 2016-08-23 (×2): via INTRAVENOUS

## 2016-08-23 MED ORDER — MIDAZOLAM HCL 10 MG/2ML IJ SOLN
INTRAMUSCULAR | Status: AC
  Filled 2016-08-23: qty 2

## 2016-08-23 MED ORDER — METOPROLOL TARTRATE 25 MG/10 ML ORAL SUSPENSION: 12.5000 mg | Freq: Two times a day (BID) | ORAL | Status: DC

## 2016-08-23 MED ORDER — PANTOPRAZOLE SODIUM 40 MG PO TBEC
40.0000 mg | DELAYED_RELEASE_TABLET | Freq: Every day | ORAL | Status: DC
  Administered 2016-08-25 – 2016-08-27 (×3): 40 mg via ORAL
  Filled 2016-08-23 (×3): qty 1

## 2016-08-23 MED ORDER — CHLORHEXIDINE GLUCONATE 0.12 % MT SOLN
15.0000 mL | OROMUCOSAL | Status: AC
Start: 2016-08-23 — End: 2016-08-23
  Administered 2016-08-23: 15 mL via OROMUCOSAL

## 2016-08-23 MED ORDER — OXYCODONE HCL 5 MG PO TABS
5.0000 mg | ORAL_TABLET | ORAL | Status: DC | PRN
  Administered 2016-08-23 – 2016-08-26 (×8): 10 mg via ORAL
  Filled 2016-08-23 (×9): qty 2

## 2016-08-23 MED ORDER — FENTANYL CITRATE (PF) 250 MCG/5ML IJ SOLN
INTRAMUSCULAR | Status: AC
  Filled 2016-08-23: qty 25

## 2016-08-23 MED ORDER — MORPHINE SULFATE (PF) 2 MG/ML IV SOLN
1.0000 mg | INTRAVENOUS | Status: DC | PRN
  Administered 2016-08-23 – 2016-08-26 (×4): 2 mg via INTRAVENOUS
  Filled 2016-08-23 (×3): qty 1

## 2016-08-23 MED ORDER — VANCOMYCIN HCL IN DEXTROSE 1-5 GM/200ML-% IV SOLN
1000.0000 mg | Freq: Once | INTRAVENOUS | Status: AC
  Administered 2016-08-23: 1000 mg via INTRAVENOUS
  Filled 2016-08-23: qty 200

## 2016-08-23 MED ORDER — PHENYLEPHRINE HCL 10 MG/ML IJ SOLN
INTRAMUSCULAR | Status: DC | PRN
  Administered 2016-08-23: 80 ug via INTRAVENOUS

## 2016-08-23 MED ORDER — ALBUMIN HUMAN 5 % IV SOLN
INTRAVENOUS | Status: DC | PRN
  Administered 2016-08-23 (×2): via INTRAVENOUS

## 2016-08-23 MED ORDER — ACETAMINOPHEN 500 MG PO TABS
1000.0000 mg | ORAL_TABLET | Freq: Four times a day (QID) | ORAL | Status: DC
  Administered 2016-08-23 – 2016-08-27 (×14): 1000 mg via ORAL
  Filled 2016-08-23 (×14): qty 2

## 2016-08-23 MED ORDER — FAMOTIDINE IN NACL 20-0.9 MG/50ML-% IV SOLN
20.0000 mg | Freq: Two times a day (BID) | INTRAVENOUS | Status: DC
  Administered 2016-08-23: 20 mg via INTRAVENOUS

## 2016-08-23 MED ORDER — ROCURONIUM BROMIDE 100 MG/10ML IV SOLN
INTRAVENOUS | Status: DC | PRN
  Administered 2016-08-23: 50 mg via INTRAVENOUS
  Administered 2016-08-23: 20 mg via INTRAVENOUS
  Administered 2016-08-23: 30 mg via INTRAVENOUS
  Administered 2016-08-23: 50 mg via INTRAVENOUS

## 2016-08-23 MED ORDER — MIDAZOLAM HCL 2 MG/2ML IJ SOLN: 2.0000 mg | INTRAMUSCULAR | Status: DC | PRN

## 2016-08-23 MED FILL — Heparin Sodium (Porcine) Inj 1000 Unit/ML: INTRAMUSCULAR | Qty: 30 | Status: AC

## 2016-08-23 MED FILL — Potassium Chloride Inj 2 mEq/ML: INTRAVENOUS | Qty: 40 | Status: AC

## 2016-08-23 MED FILL — Magnesium Sulfate Inj 50%: INTRAMUSCULAR | Qty: 10 | Status: AC

## 2016-08-23 SURGICAL SUPPLY — 118 items
ADAPTER CARDIO PERF ANTE/RETRO (ADAPTER) ×4 IMPLANT
ADAPTER MULTI PERFUSION 15 (ADAPTER) ×1 IMPLANT
ADH SKN CLS APL DERMABOND .7 (GAUZE/BANDAGES/DRESSINGS) ×4
ADPR PRFSN 84XANTGRD RTRGD (ADAPTER) ×4
BAG DECANTER FOR FLEXI CONT (MISCELLANEOUS) ×6 IMPLANT
BLADE SURG 11 STRL SS (BLADE) ×1 IMPLANT
CANISTER SUCT 3000ML PPV (MISCELLANEOUS) ×3 IMPLANT
CANNULA AORTIC ROOT 9FR (CANNULA) ×1 IMPLANT
CANNULA EZ GLIDE AORTIC 21FR (CANNULA) ×1 IMPLANT
CANNULA FEM VENOUS REMOTE 22FR (CANNULA) IMPLANT
CANNULA GUNDRY RCSP 15FR (MISCELLANEOUS) ×4 IMPLANT
CANNULA OPTISITE PERFUSION 16F (CANNULA) IMPLANT
CANNULA OPTISITE PERFUSION 18F (CANNULA) IMPLANT
CANNULA SUMP PERICARDIAL (CANNULA) ×6 IMPLANT
CATH ENDOVENT PULMONARY (CATHETERS) IMPLANT
CATH HEART VENT LEFT (CATHETERS) ×2 IMPLANT
CELLS DAT CNTRL 66122 CELL SVR (MISCELLANEOUS) ×2 IMPLANT
CONN 1/2X1/2X1/2  BEN (MISCELLANEOUS) ×1
CONN 1/2X1/2X1/2 BEN (MISCELLANEOUS) IMPLANT
CONN 3/8X1/2 ST GISH (MISCELLANEOUS) ×1 IMPLANT
CONN ST 1/4X3/8  BEN (MISCELLANEOUS) ×3
CONN ST 1/4X3/8 BEN (MISCELLANEOUS) ×4 IMPLANT
CONN Y 3/8X3/8X3/8  BEN (MISCELLANEOUS) ×1
CONN Y 3/8X3/8X3/8 BEN (MISCELLANEOUS) IMPLANT
CONNECTOR 1/2X3/8X1/2 3 WAY (MISCELLANEOUS) ×1
CONNECTOR 1/2X3/8X1/2 3WAY (MISCELLANEOUS) ×2 IMPLANT
CONT SPEC 4OZ CLIKSEAL STRL BL (MISCELLANEOUS) ×1 IMPLANT
CONT SPEC STER OR (MISCELLANEOUS) ×3 IMPLANT
COVER BACK TABLE 24X17X13 BIG (DRAPES) ×3 IMPLANT
COVER PROBE W GEL 5X96 (DRAPES) ×1 IMPLANT
CRADLE DONUT ADULT HEAD (MISCELLANEOUS) ×3 IMPLANT
DERMABOND ADVANCED (GAUZE/BANDAGES/DRESSINGS) ×2
DERMABOND ADVANCED .7 DNX12 (GAUZE/BANDAGES/DRESSINGS) ×4 IMPLANT
DEVICE PMI PUNCTURE CLOSURE (MISCELLANEOUS) ×3 IMPLANT
DEVICE TROCAR PUNCTURE CLOSURE (ENDOMECHANICALS) ×3 IMPLANT
DRAIN CHANNEL 28F RND 3/8 FF (WOUND CARE) ×6 IMPLANT
DRAIN CHANNEL 32F RND 10.7 FF (WOUND CARE) ×1 IMPLANT
DRAPE BILATERAL SPLIT (DRAPES) ×3 IMPLANT
DRAPE CV SPLIT W-CLR ANES SCRN (DRAPES) ×3 IMPLANT
DRAPE INCISE IOBAN 66X45 STRL (DRAPES) ×11 IMPLANT
DRAPE SLUSH/WARMER DISC (DRAPES) ×3 IMPLANT
DRSG COVADERM 4X8 (GAUZE/BANDAGES/DRESSINGS) ×1 IMPLANT
ELECT BLADE 4.0 EZ CLEAN MEGAD (MISCELLANEOUS) ×3
ELECT BLADE 6.5 EXT (BLADE) ×3 IMPLANT
ELECT REM PT RETURN 9FT ADLT (ELECTROSURGICAL) ×6
ELECTRODE BLDE 4.0 EZ CLN MEGD (MISCELLANEOUS) ×2 IMPLANT
ELECTRODE REM PT RTRN 9FT ADLT (ELECTROSURGICAL) ×4 IMPLANT
FELT TEFLON 1X6 (MISCELLANEOUS) ×6 IMPLANT
FEMORAL VENOUS CANN RAP (CANNULA) ×1 IMPLANT
GAUZE SPONGE 4X4 12PLY STRL (GAUZE/BANDAGES/DRESSINGS) ×3 IMPLANT
GLOVE BIO SURGEON STRL SZ 6 (GLOVE) ×1 IMPLANT
GLOVE BIO SURGEON STRL SZ 6.5 (GLOVE) ×3 IMPLANT
GLOVE BIO SURGEON STRL SZ7 (GLOVE) ×2 IMPLANT
GLOVE BIO SURGEON STRL SZ7.5 (GLOVE) ×1 IMPLANT
GLOVE ORTHO TXT STRL SZ7.5 (GLOVE) ×9 IMPLANT
GOWN STRL REUS W/ TWL LRG LVL3 (GOWN DISPOSABLE) ×8 IMPLANT
GOWN STRL REUS W/TWL LRG LVL3 (GOWN DISPOSABLE) ×18
GRASPER SUT TROCAR 14GX15 (MISCELLANEOUS) ×1 IMPLANT
KIT BASIN OR (CUSTOM PROCEDURE TRAY) ×3 IMPLANT
KIT CATH SUCT 8FR (CATHETERS) ×3 IMPLANT
KIT DILATOR VASC 18G NDL (KITS) ×4 IMPLANT
KIT DRAINAGE VACCUM ASSIST (KITS) ×1 IMPLANT
KIT ROOM TURNOVER OR (KITS) ×3 IMPLANT
KIT SUCTION CATH 14FR (SUCTIONS) ×4 IMPLANT
KIT SUT CK MINI COMBO 4X17 (Prosthesis & Implant Heart) ×1 IMPLANT
LEAD PACING MYOCARDI (MISCELLANEOUS) ×3 IMPLANT
LINE VENT (MISCELLANEOUS) ×1 IMPLANT
NDL AORTIC ROOT 14G 7F (CATHETERS) ×2 IMPLANT
NEEDLE AORTIC ROOT 14G 7F (CATHETERS) ×3 IMPLANT
NS IRRIG 1000ML POUR BTL (IV SOLUTION) ×17 IMPLANT
PACK OPEN HEART (CUSTOM PROCEDURE TRAY) ×3 IMPLANT
PAD ARMBOARD 7.5X6 YLW CONV (MISCELLANEOUS) ×6 IMPLANT
PAD ELECT DEFIB RADIOL ZOLL (MISCELLANEOUS) ×3 IMPLANT
RETRACTOR WND ALEXIS 18 MED (MISCELLANEOUS) ×2 IMPLANT
RTRCTR WOUND ALEXIS 18CM MED (MISCELLANEOUS) ×3
SET CANNULATION TOURNIQUET (MISCELLANEOUS) ×4 IMPLANT
SET CARDIOPLEGIA MPS 5001102 (MISCELLANEOUS) ×1 IMPLANT
SET IRRIG TUBING LAPAROSCOPIC (IRRIGATION / IRRIGATOR) ×3 IMPLANT
SOLUTION ANTI FOG 6CC (MISCELLANEOUS) ×3 IMPLANT
SPONGE GAUZE 4X4 12PLY STER LF (GAUZE/BANDAGES/DRESSINGS) ×1 IMPLANT
SUT BONE WAX W31G (SUTURE) ×3 IMPLANT
SUT ETHIBOND 2 0 SH (SUTURE) ×12
SUT ETHIBOND 2 0 SH 36X2 (SUTURE) IMPLANT
SUT ETHIBOND X763 2 0 SH 1 (SUTURE) ×6 IMPLANT
SUT GORETEX CV 4 TH 22 36 (SUTURE) ×3 IMPLANT
SUT GORETEX CV4 TH-18 (SUTURE) ×6 IMPLANT
SUT PROLENE 3 0 SH 1 (SUTURE) ×1 IMPLANT
SUT PROLENE 3 0 SH DA (SUTURE) ×3 IMPLANT
SUT PROLENE 3 0 SH1 36 (SUTURE) ×1 IMPLANT
SUT PROLENE 4 0 RB 1 (SUTURE) ×3
SUT PROLENE 4-0 RB1 .5 CRCL 36 (SUTURE) IMPLANT
SUT PROLENE 5 0 C 1 36 (SUTURE) ×1 IMPLANT
SUT PROLENE 6 0 C 1 30 (SUTURE) ×2 IMPLANT
SUT SILK  1 MH (SUTURE) ×4
SUT SILK 1 MH (SUTURE) IMPLANT
SUT SILK 2 0 SH CR/8 (SUTURE) ×2 IMPLANT
SUT SILK 3 0 SH CR/8 (SUTURE) ×1 IMPLANT
SUT STEEL 6MS V (SUTURE) ×1 IMPLANT
SUT STEEL SZ 6 DBL 3X14 BALL (SUTURE) ×1 IMPLANT
SUT TEM PAC WIRE 2 0 SH (SUTURE) ×8 IMPLANT
SUT VIC AB 2-0 CTX 27 (SUTURE) ×2 IMPLANT
SYR 10ML LL (SYRINGE) ×3 IMPLANT
SYSTEM SAHARA CHEST DRAIN ATS (WOUND CARE) ×3 IMPLANT
TAPE CLOTH SURG 4X10 WHT LF (GAUZE/BANDAGES/DRESSINGS) ×1 IMPLANT
TAPE PAPER 2X10 WHT MICROPORE (GAUZE/BANDAGES/DRESSINGS) ×1 IMPLANT
TOWEL GREEN STERILE (TOWEL DISPOSABLE) ×12 IMPLANT
TOWEL GREEN STERILE FF (TOWEL DISPOSABLE) ×6 IMPLANT
TOWEL OR 17X24 6PK STRL BLUE (TOWEL DISPOSABLE) ×3 IMPLANT
TOWEL OR 17X26 10 PK STRL BLUE (TOWEL DISPOSABLE) ×3 IMPLANT
TRAY FOLEY IC TEMP SENS 16FR (CATHETERS) ×3 IMPLANT
TROCAR XCEL BLADELESS 5X75MML (TROCAR) IMPLANT
TROCAR XCEL NON-BLD 11X100MML (ENDOMECHANICALS) ×6 IMPLANT
TUBE SUCT INTRACARD DLP 20F (MISCELLANEOUS) ×4 IMPLANT
UNDERPAD 30X30 (UNDERPADS AND DIAPERS) ×3 IMPLANT
VALVE SYSTEM EDWS INTUITY 25A (Prosthesis & Implant Heart) ×1 IMPLANT
VENT LEFT HEART 12002 (CATHETERS) ×6
WATER STERILE IRR 1000ML POUR (IV SOLUTION) ×6 IMPLANT
YANKAUER SUCT BULB TIP NO VENT (SUCTIONS) ×1 IMPLANT

## 2016-08-23 NOTE — Interval H&P Note (Signed)
History and Physical Interval Note:  08/23/2016 7:33 AM  [name]  has presented today for surgery, with the diagnosis of AS  The various methods of treatment have been discussed with the patient and family. After consideration of risks, benefits and other options for treatment, the patient has consented to  Procedure(s): MINIMALLY INVASIVE AORTIC VALVE REPLACEMENT (AVR) (N/A) TRANSESOPHAGEAL ECHOCARDIOGRAM (TEE) (N/A) as a surgical intervention .  The patient's history has been reviewed, patient examined, no change in status, stable for surgery.  I have reviewed the patient's chart and labs.  Questions were answered to the patient's satisfaction.     Rexene Alberts

## 2016-08-23 NOTE — Transfer of Care (Signed)
Immediate Anesthesia Transfer of Care Note  Patient: [name]  Procedure(s) Performed: Procedure(s): TRANSESOPHAGEAL ECHOCARDIOGRAM (TEE) (N/A) AORTIC VALVE REPLACEMENT (AVR) - using partial Upper Sternotomy- 83mm Edwards Intuity Aortic Valve used (N/A)  Patient Location: SICU  Anesthesia Type:General  Level of Consciousness: Patient remains intubated per anesthesia plan  Airway & Oxygen Therapy: Patient remains intubated per anesthesia plan  Post-op Assessment: Report given to RN and Post -op Vital signs reviewed and stable  Post vital signs: Reviewed and stable  Last Vitals:  Vitals:   08/23/16 0549 08/23/16 1235  BP: (!) 153/95 (!) 125/59  Pulse: 83 88  Resp: 20 20  Temp: 37.2 C     Last Pain:  Vitals:   08/23/16 0549  TempSrc: Oral         Complications: No apparent anesthesia complications

## 2016-08-23 NOTE — Anesthesia Procedure Notes (Signed)
Anesthesia Procedure Note Procedures: Leftt Sander Radon Catheter Insertion: [phone]: The patient was identified and consent obtained.  TO was performed, and full barrier precautions were used.  The skin was anesthetized with lidocaine-4cc plain with 25g needle.  Once the vein was located with the 22 ga. needle using ultrasound guidance , the wire was inserted into the vein.  The wire location was confirmed with ultrasound.  The tissue was dilated and the 8.5 [place] cordis catheter was carefully inserted.  Gordy Councilman catheter will be inserted in OR by Dr. Ermalene Postin as discussed. The patient tolerated the procedure well.   CG

## 2016-08-23 NOTE — Brief Op Note (Signed)
08/23/2016  11:09 AM  PATIENT:  Kemesha P Rickett  66 y.o. female  PRE-OPERATIVE DIAGNOSIS:  AS  POST-OPERATIVE DIAGNOSIS:  AS  PROCEDURE:  Procedure(s): TRANSESOPHAGEAL ECHOCARDIOGRAM (TEE) (N/A) AORTIC VALVE REPLACEMENT (AVR) - using partial Upper Sternotomy- 42mm Edwards Intuity Aortic Valve used (N/A)  SURGEON:  Surgeon(s) and Role:    * Rexene Alberts, MD - Primary  PHYSICIAN ASSISTANT: WAYNE GOLD PA-C  ANESTHESIA:   general  EBL:  Total I/O In: -  Out: 535 [Urine:535]  BLOOD ADMINISTERED:none  DRAINS: 2 Chest Tube(s) in the chest   LOCAL MEDICATIONS USED:  NONE  SPECIMEN:  Source of Specimen:  AORTIC VALVE LEAFLETS  DISPOSITION OF SPECIMEN:  PATHOLOGY  COUNTS:  YES  TOURNIQUET:  * No tourniquets in log *  DICTATION: .Other Dictation: Dictation Number [id number]  PLAN OF CARE: Admit to inpatient   PATIENT DISPOSITION:  ICU - intubated and hemodynamically stable.   Delay start of Pharmacological VTE agent (>24hrs) due to surgical blood loss or risk of bleeding: yes

## 2016-08-23 NOTE — Anesthesia Procedure Notes (Signed)
Procedures

## 2016-08-23 NOTE — OR Nursing (Signed)
11:30 - 40 minute call to SICU nurse 12:00 - 20 minute call to SICU nurse

## 2016-08-23 NOTE — Plan of Care (Signed)
First abg abnormal, repeated with new sample and new machine, results better Dr. Roxy Manns aware, edit sheet to lab

## 2016-08-23 NOTE — Progress Notes (Signed)
Pt was extubated to 4 L [place]. NIF -25, .9L FVC. SATS 97. Pt is able to speak and has a strong cough. RT will continue to monitor

## 2016-08-23 NOTE — Brief Op Note (Signed)
08/23/2016  12:04 PM  PATIENT:  Tanysha P Riedl  66 y.o. female  PRE-OPERATIVE DIAGNOSIS:  AS  POST-OPERATIVE DIAGNOSIS:  AS  PROCEDURE:  Procedure(s): TRANSESOPHAGEAL ECHOCARDIOGRAM (TEE) (N/A) AORTIC VALVE REPLACEMENT (AVR) - using partial Upper Sternotomy- 75mm Edwards Intuity Aortic Valve used (N/A)  SURGEON:    Rexene Alberts, MD  ASSISTANTS:  John Giovanni, PA-C  ANESTHESIA:   Oleta Mouse, MD  CROSSCLAMP TIME:   52'  CARDIOPULMONARY BYPASS TIME: 73'  FINDINGS:  Bicuspid aortic valve with severe aortic stenosis  Normal LV systolic function  Moderate LV hypertrophy  COMPLICATIONS: None  BASELINE WEIGHT: 102 kg  PATIENT DISPOSITION:   TO SICU IN STABLE CONDITION  Rexene Alberts, MD 08/23/2016 12:04 PM

## 2016-08-23 NOTE — Op Note (Signed)
CARDIOTHORACIC SURGERY OPERATIVE NOTE  Date of Procedure:  08/23/2016  Preoperative Diagnosis: Severe Aortic Stenosis   Postoperative Diagnosis: Same   Procedure:    Minimally Invasive Aortic Valve Replacement  Partial Upper Mini-sternotomy  Edwards Intuity Elite rapid deployment bovine pericardial tissue valve (size 25 mm, model # 8300AB, serial # [id number])   Surgeon: Valentina Gu. Roxy Manns, MD  Assistant: John Giovanni, PA-C  Anesthesia: Laurie Panda, MD  Operative Findings:  Bicuspid aortic valve with severe aortic stenosis  Normal LV systolic function  Moderate LV hypertrophy           BRIEF CLINICAL NOTE AND INDICATIONS FOR SURGERY  Patient is a 66 year old obese female with history of aortic stenosis, atypical chest pain, obstructive sleep apnea on CPAP, GE reflux disease, and type 2 diabetes mellitus who has been referred for surgical consultation to discuss treatment options for management of severe symptomatic aortic stenosis. The patient has been obese for the majority of her adult life. She was followed for many years by Dr. Arnoldo Morale but more recently has been followed by Dr. Yong Channel subsequent to Dr. Arnoldo Morale retirement. Dr. Yong Channel noted a systolic murmur on physical exam and transthoracic echocardiogram performed in 2015 revealed normal left ventricular systolic function with moderate aortic stenosis, mean transvalvular gradient estimated 30 mmHg.. The patient began to experience symptoms of chest pain and was referred to Dr. Meda Coffee for cardiology consultation in 2016. Nuclear stress test was performed and felt to be low risk with ejection fraction measured 60%. The patient was started on low-dose aspirin and has been followed ever since by Dr. Meda Coffee. She was seen in follow-up recently and noted to complain of progressive symptoms of exertional shortness of breath with worsening fatigue. Follow-up echocardiogram was performed 07/05/2016 revealed significant progression of  aortic stenosis. Left ventricular function remained normal with ejection fraction estimated 55-60%. Peak velocity across the aortic valve was measured as high as 4.4 m/s corresponding to mean transvalvular gradient estimated 88mHg. The patient was referred for surgical consultation.  The patient has been seen in consultation and counseled at length regarding the indications, risks and potential benefits of surgery.  All questions have been answered, and the patient provides full informed consent for the operation as described.    DETAILS OF THE OPERATIVE PROCEDURE  Preparation:  The patient is brought to the operating room on the above mentioned date and central monitoring was established by the anesthesia team including placement of Swan-Ganz catheter and radial arterial line. The patient is placed in the supine position on the operating table.  Intravenous antibiotics are administered. General endotracheal anesthesia is induced uneventfully. A Foley catheter is placed.  Baseline transesophageal echocardiogram was performed.  Findings were notable for severe aortic stenosis. He had a bicuspid valve with a single rafe causing fusion between the left and right leaflet of the valve. There was trivial aortic insufficiency. There was moderate left ventricular hypertrophy. Left ventricular systolic function was normal. No other abnormalities were noted.  The patient's chest, abdomen, both groins, and both lower extremities are prepared and draped in a sterile manner. A time out procedure is performed.   Surgical Approach:  A partial upper mini sternotomy incision was performed with "J" into the right third intercostal space.  The pericardium is opened. The ascending aorta is normal in appearance.    Extracorporeal Cardiopulmonary Bypass and Myocardial Protection:  The right common femoral vein is cannulated with Seldinger technique and a flexible guidewire advanced under TEE guidance through the  right atrium  into the superior vena cava. The patient is heparinized systemically. The right common femoral vein is cannulated with a long femoral venous cannula using TEE guidance. The ascending aorta is cannulated for cardiopulmonary bypass.  Adequate heparinization is verified.   The operative field was continuously flooded with carbon dioxide gas.    The entire pre-bypass portion of the operation was notable for stable hemodynamics.  Cardiopulmonary bypass was begun.  A left ventricular vent is placed through the right superior pulmonary vein.   A cardioplegia cannula is placed in the ascending aorta.  A temperature probe was placed in the interventricular septum.  The patient is allowed to cool passively to [facility] systemic temperature.  The aortic cross clamp is applied and modified [place] cold blood cardioplegia is delivered in an antegrade fashion through the aortic root.   Iced saline slush is applied for topical hypothermia.  The initial cardioplegic arrest is rapid with early diastolic arrest.  Myocardial protection was felt to be excellent.   Aortic Valve Replacement:  An oblique transverse aortotomy incision was performed.  The aortic valve was inspected and notable for Sievers type I bicuspid aortic valve with severe aortic stenosis.  There was a large raphe joining the left and right leaflet of the valve. The aortic valve leaflets were excised sharply and the aortic annulus decalcified.  Decalcification was notably straightforward.  The aortic annulus was sized to accept a 25 mm prosthesis.  The aortic root and left ventricle were irrigated with copious cold saline solution.  Aortic valve replacement was performed using an [employer] rapid deployment pericardial tissue valve (size 25 mm, model #8300AB, serial # [id number]).  The valve was rinsed in saline for manufacture recommendations. A total of 6 individual guiding sutures are placed through the aortic annulus, each at the  corresponding nadir of each sinus of Valsalva.  The valve was attached to the delivery device and the 3 guiding sutures placed through the valve sewing cuff. The valve is lowered into place. Care is taken to insure that the valve is completely seated in the annulus. Each of the guide sutures are secured using Cor knot clips.  The valve stent is deployed by inflating the deployment balloon to 5.0 atm pressure and holding for 10 seconds. The balloon is deflated the valve holder sutures cut In the deployment system removed.  The valve is carefully inspected to make sure that it is seated appropriately. Endoscopic visualization through the valve is utilized to inspect the left ventricular outflow tract. Aortic root was filled with saline to make sure the valve is competent. Rewarming is begun.   Procedure Completion:  The aortotomy was closed using a 2-layer closure of running 4-0 Prolene suture. All air was evacuated through the aortic root.  The aortic cross clamp was removed after a total cross clamp time of 66 minutes.  Epicardial pacing wires are fixed to the right ventricular outflow tract and to the right atrial appendage. The patient is rewarmed to 37C temperature. The aortic and left ventricular vents are removed.  The patient is weaned and disconnected from cardiopulmonary bypass.  The patient's rhythm at separation from bypass was AV paced.  The patient was weaned from cardioplegic bypass without any inotropic support. Total cardiopulmonary bypass time for the operation was 92 minutes.  Followup transesophageal echocardiogram performed after separation from bypass revealed a well-seated aortic valve prosthesis that was functioning normally and without any sign of perivalvular leak.  Deep transgastric views are obtained from multiple angles  to make certain the valve is well-seated.  Mean transvalvular gradient across the valve is estimated 5 mmHg.  Left ventricular function was unchanged from  preoperatively.  The aortic and venous cannula were removed uneventfully. Protamine was administered to reverse the anticoagulation. The mediastinum and pleural space were inspected for hemostasis and irrigated with saline solution. The mediastinum and right pleural space were drained using 2 chest tubes placed through separate stab incisions.  The soft tissues anterior to the aorta were reapproximated loosely. The sternum is closed with double strength sternal wire. The soft tissues anterior to the sternum were closed in multiple layers and the skin is closed with a running subcuticular skin closure.  The post-bypass portion of the operation was notable for stable rhythm and hemodynamics.  No blood products were administered during the operation.   Disposition:  The patient tolerated the procedure well and is transported to the surgical intensive care in stable condition. There are no intraoperative complications. All sponge instrument and needle counts are verified correct at completion of the operation.    Valentina Gu. Roxy Manns MD 08/23/2016 12:08 PM

## 2016-08-23 NOTE — Procedures (Signed)
Extubation Procedure Note  Patient Details:   Name: SPRING WALCZYK DOB: 10-03-1950 MRN: [id number]   Airway Documentation:     Evaluation  O2 sats: stable throughout Complications: No apparent complications Patient did tolerate procedure well. Bilateral Breath Sounds: Rhonchi   Yes  Elsie Stain 08/23/2016, 5:21 PM

## 2016-08-23 NOTE — Progress Notes (Signed)
Placed patient on CPAP set at 10cm with oxygen set at 5lpm. Will continue to monitor patient

## 2016-08-23 NOTE — Progress Notes (Signed)
CT surgery p.m. Rounds  Status post aVR for aortic stenosis Extubated with stable hemodynamics Complains of upper back discomfort Neuro intact Continue current care

## 2016-08-23 NOTE — Anesthesia Procedure Notes (Signed)
Procedure Name: Intubation Date/Time: 08/23/2016 7:56 AM Performed by: Clearnce Sorrel Pre-anesthesia Checklist: Patient identified, Emergency Drugs available, Suction available, Patient being monitored and Timeout performed Patient Re-evaluated:Patient Re-evaluated prior to inductionOxygen Delivery Method: Circle system utilized Preoxygenation: Pre-oxygenation with 100% oxygen Intubation Type: IV induction Ventilation: Mask ventilation without difficulty Laryngoscope Size: Mac and 3 Grade View: Grade II Tube type: Oral Tube size: 8.0 mm Number of attempts: 1 Airway Equipment and Method: Stylet Placement Confirmation: ETT inserted through vocal cords under direct vision,  positive ETCO2 and breath sounds checked- equal and bilateral Secured at: 23 cm Tube secured with: Tape Dental Injury: Teeth and Oropharynx as per pre-operative assessment

## 2016-08-23 NOTE — Anesthesia Preprocedure Evaluation (Signed)
Anesthesia Evaluation  Patient identified by MRN, date of birth, ID band Patient awake    Reviewed: Allergy & Precautions, NPO status , Patient's Chart, lab work & pertinent test results  History of Anesthesia Complications Negative for: history of anesthetic complications  Airway Mallampati: II  TM Distance: >3 FB Neck ROM: Full    Dental  (+) Dental Advisory Given   Pulmonary asthma , sleep apnea ,    breath sounds clear to auscultation       Cardiovascular +CHF  + Valvular Problems/Murmurs AS  Rhythm:Regular + Systolic murmurs    Neuro/Psych  Headaches, PSYCHIATRIC DISORDERS Anxiety    GI/Hepatic Neg liver ROS, GERD  ,  Endo/Other  diabetes  Renal/GU negative Renal ROS     Musculoskeletal  (+) Arthritis ,   Abdominal   Peds  Hematology negative hematology ROS (+)   Anesthesia Other Findings   Reproductive/Obstetrics                             Anesthesia Physical Anesthesia Plan  ASA: IV  Anesthesia Plan: General   Post-op Pain Management:    Induction: Intravenous  Airway Management Planned: Oral ETT  Additional Equipment: Arterial line, TEE, CVP, Ultrasound Guidance Line Placement and PA Cath  Intra-op Plan:   Post-operative Plan: Post-operative intubation/ventilation  Informed Consent: I have reviewed the patients History and Physical, chart, labs and discussed the procedure including the risks, benefits and alternatives for the proposed anesthesia with the patient or authorized representative who has indicated his/her understanding and acceptance.   Dental advisory given  Plan Discussed with: CRNA and Surgeon  Anesthesia Plan Comments:         Anesthesia Quick Evaluation

## 2016-08-23 NOTE — Plan of Care (Signed)
Dangled patient and Right groin bled mod amount bright red blood, redressed, will continue to monitor

## 2016-08-24 ENCOUNTER — Inpatient Hospital Stay (HOSPITAL_COMMUNITY): Payer: Medicare Other

## 2016-08-24 LAB — CBC
HCT: 30.7 % — ABNORMAL LOW (ref 36.0–46.0)
HCT: 30.2 % — ABNORMAL LOW (ref 36.0–46.0)
Hemoglobin: 9.8 g/dL — ABNORMAL LOW (ref 12.0–15.0)
Hemoglobin: 9.5 g/dL — ABNORMAL LOW (ref 12.0–15.0)
MCH: 27.5 pg (ref 26.0–34.0)
MCH: 27.1 pg (ref 26.0–34.0)
MCHC: 31.9 g/dL (ref 30.0–36.0)
MCHC: 31.5 g/dL (ref 30.0–36.0)
MCV: 86 fL (ref 78.0–100.0)
MCV: 86.3 fL (ref 78.0–100.0)
Platelets: 188 10*3/uL (ref 150–400)
Platelets: 163 10*3/uL (ref 150–400)
RBC: 3.57 MIL/uL — ABNORMAL LOW (ref 3.87–5.11)
RBC: 3.5 MIL/uL — ABNORMAL LOW (ref 3.87–5.11)
RDW: 13.9 % (ref 11.5–15.5)
RDW: 14 % (ref 11.5–15.5)
WBC: 16.5 10*3/uL — ABNORMAL HIGH (ref 4.0–10.5)
WBC: 18.6 10*3/uL — ABNORMAL HIGH (ref 4.0–10.5)

## 2016-08-24 LAB — MAGNESIUM
Magnesium: 2.4 mg/dL (ref 1.7–2.4)
Magnesium: 2 mg/dL (ref 1.7–2.4)

## 2016-08-24 LAB — BASIC METABOLIC PANEL
Anion gap: 10 (ref 5–15)
BUN: 10 mg/dL (ref 6–20)
CO2: 22 mmol/L (ref 22–32)
Calcium: 7.8 mg/dL — ABNORMAL LOW (ref 8.9–10.3)
Chloride: 106 mmol/L (ref 101–111)
Creatinine, Ser: 0.52 mg/dL (ref 0.44–1.00)
GFR calc Af Amer: >60 mL/min (ref >60)
GFR calc non Af Amer: >60 mL/min (ref >60)
Glucose, Bld: 122 mg/dL — ABNORMAL HIGH (ref 65–99)
Potassium: 3.8 mmol/L (ref 3.5–5.1)
Sodium: 138 mmol/L (ref 135–145)

## 2016-08-24 LAB — GLUCOSE, CAPILLARY
Glucose-Capillary: 107 mg/dL — ABNORMAL HIGH (ref 65–99)
Glucose-Capillary: 109 mg/dL — ABNORMAL HIGH (ref 65–99)
Glucose-Capillary: 112 mg/dL — ABNORMAL HIGH (ref 65–99)
Glucose-Capillary: 115 mg/dL — ABNORMAL HIGH (ref 65–99)
Glucose-Capillary: 116 mg/dL — ABNORMAL HIGH (ref 65–99)
Glucose-Capillary: 118 mg/dL — ABNORMAL HIGH (ref 65–99)
Glucose-Capillary: 119 mg/dL — ABNORMAL HIGH (ref 65–99)
Glucose-Capillary: 122 mg/dL — ABNORMAL HIGH (ref 65–99)
Glucose-Capillary: 123 mg/dL — ABNORMAL HIGH (ref 65–99)
Glucose-Capillary: 123 mg/dL — ABNORMAL HIGH (ref 65–99)
Glucose-Capillary: 129 mg/dL — ABNORMAL HIGH (ref 65–99)
Glucose-Capillary: 142 mg/dL — ABNORMAL HIGH (ref 65–99)
Glucose-Capillary: 142 mg/dL — ABNORMAL HIGH (ref 65–99)
Glucose-Capillary: 148 mg/dL — ABNORMAL HIGH (ref 65–99)
Glucose-Capillary: 149 mg/dL — ABNORMAL HIGH (ref 65–99)
Glucose-Capillary: 175 mg/dL — ABNORMAL HIGH (ref 65–99)

## 2016-08-24 LAB — POCT I-STAT, CHEM 8
BUN: 11 mg/dL (ref 6–20)
Calcium, Ion: 1.15 mmol/L (ref 1.15–1.40)
Chloride: 97 mmol/L — ABNORMAL LOW (ref 101–111)
Creatinine, Ser: 0.5 mg/dL (ref 0.44–1.00)
Glucose, Bld: 155 mg/dL — ABNORMAL HIGH (ref 65–99)
HCT: 29 % — ABNORMAL LOW (ref 36.0–46.0)
Hemoglobin: 9.9 g/dL — ABNORMAL LOW (ref 12.0–15.0)
Potassium: 3.7 mmol/L (ref 3.5–5.1)
Sodium: 134 mmol/L — ABNORMAL LOW (ref 135–145)
TCO2: 25 mmol/L (ref 0–100)

## 2016-08-24 LAB — CREATININE, SERUM
Creatinine, Ser: 0.6 mg/dL (ref 0.44–1.00)
GFR calc Af Amer: >60 mL/min (ref >60)
GFR calc non Af Amer: >60 mL/min (ref >60)

## 2016-08-24 MED ORDER — INSULIN ASPART 100 UNIT/ML ~~LOC~~ SOLN
0.0000 [IU] | SUBCUTANEOUS | Status: DC
  Administered 2016-08-24 – 2016-08-25 (×3): 2 [IU] via SUBCUTANEOUS

## 2016-08-24 MED ORDER — ORAL CARE MOUTH RINSE
15.0000 mL | Freq: Two times a day (BID) | OROMUCOSAL | Status: DC
  Administered 2016-08-24 – 2016-08-25 (×2): 15 mL via OROMUCOSAL

## 2016-08-24 MED ORDER — SODIUM CHLORIDE 0.9 % IV SOLN
30.0000 meq | Freq: Once | INTRAVENOUS | Status: AC
  Administered 2016-08-24: 30 meq via INTRAVENOUS
  Filled 2016-08-24: qty 15

## 2016-08-24 MED ORDER — INSULIN DETEMIR 100 UNIT/ML ~~LOC~~ SOLN
8.0000 [IU] | Freq: Two times a day (BID) | SUBCUTANEOUS | Status: DC
  Administered 2016-08-24 – 2016-08-25 (×4): 8 [IU] via SUBCUTANEOUS
  Filled 2016-08-24 (×5): qty 0.08

## 2016-08-24 MED ORDER — FUROSEMIDE 10 MG/ML IJ SOLN
20.0000 mg | Freq: Two times a day (BID) | INTRAMUSCULAR | Status: AC
  Administered 2016-08-24 – 2016-08-25 (×3): 20 mg via INTRAVENOUS
  Filled 2016-08-24 (×3): qty 2

## 2016-08-24 NOTE — Progress Notes (Signed)
CT surgery p.m. Rounds  Patient up and with a short walk today but with some dizziness Chest tubes out this evening with minimal output Sinus rhythm off drips P.m. labs satisfactory

## 2016-08-24 NOTE — Plan of Care (Signed)
Problem: Cardiac: Goal: Hemodynamic stability will improve Outcome: Completed/Met Date Met: 08/24/16 CI>2  Problem: Respiratory: Goal: Respiratory status will improve Outcome: Progressing Pt on BiPAP @ night  Problem: Pain Management: Goal: Pain level will decrease Outcome: Progressing Pt state pain is minimal

## 2016-08-24 NOTE — Progress Notes (Signed)
1 Day Post-Op Procedure(s) (LRB): TRANSESOPHAGEAL ECHOCARDIOGRAM (TEE) (N/A) AORTIC VALVE REPLACEMENT (AVR) - using partial Upper Sternotomy- 45mm Edwards Intuity Aortic Valve used (N/A) Subjective: Stable postop AVR nsr Up in chair  Objective: Vital signs in last 24 hours: Temp:  [97.5 F (36.4 C)-99.9 F (37.7 C)] 99.1 F (37.3 C) (03/03 0800) Pulse Rate:  [66-88] 79 (03/03 1100) Cardiac Rhythm: Atrial paced;Normal sinus rhythm (03/03 0800) Resp:  [7-35] 12 (03/03 1100) BP: (89-132)/(46-69) 91/58 (03/03 1100) SpO2:  [90 %-99 %] 95 % (03/03 1100) Arterial Line BP: (91-164)/(44-80) 121/44 (03/03 1100) FiO2 (%):  [40 %-50 %] 40 % (03/02 1557) Weight:  [239 lb 13.8 oz (108.8 kg)] 239 lb 13.8 oz (108.8 kg) (03/03 0615)  Hemodynamic parameters for last 24 hours: PAP: (26-45)/(13-30) 45/15 CO:  [4.7 L/min-6.6 L/min] 4.7 L/min CI:  [2.3 L/min/m2-3 L/min/m2] 2.3 L/min/m2  Intake/Output from previous day: 03/02 0701 - 03/03 0700 In: 4861.5 [I.V.:3521.5; Blood:425; IV [id number] Out: [id number]; Blood:1000; Chest Tube:220] Intake/Output this shift: Total I/O In: 961.1 [P.O.:720; I.V.:91.1; IV Piggyback:150] Out: 500 [Urine:360; Chest Tube:140]       Exam    General- alert and comfortable   Lungs- clear without rales, wheezes   Cor- regular rate and rhythm, no murmur , gallop   Abdomen- soft, non-tender   Extremities - warm, non-tender, minimal edema   Neuro- oriented, appropriate, no focal weakness   Lab Results:  Recent Labs  08/23/16 1835 08/23/16 1838 08/24/16 0317  WBC 14.4*  --  16.5*  HGB 10.2* 9.9* 9.8*  HCT 32.1* 29.0* 30.7*  PLT 177  --  188   BMET:  Recent Labs  08/21/16 1227  08/23/16 1838 08/24/16 0317  NA 133*  < > 141 138  K 4.4  < > 3.9 3.8  CL 97*  < > 105 106  CO2 26  --   --  22  GLUCOSE 124*  < > 126* 122*  BUN 11  < > 9 10  CREATININE 0.63  < > 0.40* 0.52  CALCIUM 9.3  --   --  7.8*  < > = values in this interval not  displayed.  PT/INR:  Recent Labs  08/23/16 1230  LABPROT 18.2*  INR 1.49   ABG    Component Value Date/Time   PHART 7.283 (L) 08/23/2016 1753   HCO3 22.3 08/23/2016 1753   TCO2 23 08/23/2016 1838   ACIDBASEDEF 4.0 (H) 08/23/2016 1753   O2SAT 90.0 08/23/2016 1753   CBG (last 3)   Recent Labs  08/24/16 0849 08/24/16 0951 08/24/16 1056  GLUCAP 112* 149* 175*    Assessment/Plan: S/P Procedure(s) (LRB): TRANSESOPHAGEAL ECHOCARDIOGRAM (TEE) (N/A) AORTIC VALVE REPLACEMENT (AVR) - using partial Upper Sternotomy- 28mm Edwards Intuity Aortic Valve used (N/A) Mobilize Diuresis Diabetes control See progression orders   LOS: 1 day    Tharon Aquas Trigt III 08/24/2016

## 2016-08-24 NOTE — Progress Notes (Signed)
K+ is 3.7. IV K+ replacement protocol initiated and order for multi-run K+ placed.

## 2016-08-24 NOTE — Anesthesia Postprocedure Evaluation (Addendum)
Anesthesia Post Note  Patient: Jahniah P Belote  Procedure(s) Performed: Procedure(s) (LRB): TRANSESOPHAGEAL ECHOCARDIOGRAM (TEE) (N/A) AORTIC VALVE REPLACEMENT (AVR) - using partial Upper Sternotomy- 13mm Edwards Intuity Aortic Valve used (N/A)  Patient location during evaluation: ICU Anesthesia Type: General Level of consciousness: sedated Pain management: pain level controlled Vital Signs Assessment: post-procedure vital signs reviewed and stable Respiratory status: patient remains intubated per anesthesia plan Cardiovascular status: stable Anesthetic complications: no       Last Vitals:  Vitals:   08/24/16 1100 08/24/16 1205  BP: (!) 91/58 (!) 90/47  Pulse: 79 82  Resp: 12 19  Temp:      Last Pain:  Vitals:   08/24/16 1042  TempSrc:   PainSc: 0-No pain                 Duaa Stelzner

## 2016-08-25 ENCOUNTER — Inpatient Hospital Stay (HOSPITAL_COMMUNITY): Payer: Medicare Other

## 2016-08-25 LAB — BASIC METABOLIC PANEL
Anion gap: 7 (ref 5–15)
BUN: 11 mg/dL (ref 6–20)
CO2: 25 mmol/L (ref 22–32)
Calcium: 8.1 mg/dL — ABNORMAL LOW (ref 8.9–10.3)
Chloride: 100 mmol/L — ABNORMAL LOW (ref 101–111)
Creatinine, Ser: 0.65 mg/dL (ref 0.44–1.00)
GFR calc Af Amer: >60 mL/min (ref >60)
GFR calc non Af Amer: >60 mL/min (ref >60)
Glucose, Bld: 134 mg/dL — ABNORMAL HIGH (ref 65–99)
Potassium: 4 mmol/L (ref 3.5–5.1)
Sodium: 132 mmol/L — ABNORMAL LOW (ref 135–145)

## 2016-08-25 LAB — GLUCOSE, CAPILLARY
Glucose-Capillary: 103 mg/dL — ABNORMAL HIGH (ref 65–99)
Glucose-Capillary: 106 mg/dL — ABNORMAL HIGH (ref 65–99)
Glucose-Capillary: 118 mg/dL — ABNORMAL HIGH (ref 65–99)
Glucose-Capillary: 125 mg/dL — ABNORMAL HIGH (ref 65–99)
Glucose-Capillary: 139 mg/dL — ABNORMAL HIGH (ref 65–99)
Glucose-Capillary: 161 mg/dL — ABNORMAL HIGH (ref 65–99)
Glucose-Capillary: 84 mg/dL (ref 65–99)

## 2016-08-25 LAB — CBC
HCT: 28.7 % — ABNORMAL LOW (ref 36.0–46.0)
Hemoglobin: 9.3 g/dL — ABNORMAL LOW (ref 12.0–15.0)
MCH: 28 pg (ref 26.0–34.0)
MCHC: 32.4 g/dL (ref 30.0–36.0)
MCV: 86.4 fL (ref 78.0–100.0)
Platelets: 152 10*3/uL (ref 150–400)
RBC: 3.32 MIL/uL — ABNORMAL LOW (ref 3.87–5.11)
RDW: 14.2 % (ref 11.5–15.5)
WBC: 16.9 10*3/uL — ABNORMAL HIGH (ref 4.0–10.5)

## 2016-08-25 MED ORDER — MAGNESIUM HYDROXIDE 400 MG/5ML PO SUSP: 30.0000 mL | Freq: Every day | ORAL | Status: DC | PRN

## 2016-08-25 MED ORDER — ALUM & MAG HYDROXIDE-SIMETH 200-200-20 MG/5ML PO SUSP: 15.0000 mL | ORAL | Status: DC | PRN

## 2016-08-25 MED ORDER — SODIUM CHLORIDE 0.9% FLUSH: 3.0000 mL | INTRAVENOUS | Status: DC | PRN

## 2016-08-25 MED ORDER — ENOXAPARIN SODIUM 40 MG/0.4ML ~~LOC~~ SOLN
40.0000 mg | SUBCUTANEOUS | Status: DC
  Administered 2016-08-25 – 2016-08-27 (×3): 40 mg via SUBCUTANEOUS
  Filled 2016-08-25 (×3): qty 0.4

## 2016-08-25 MED ORDER — INSULIN ASPART 100 UNIT/ML ~~LOC~~ SOLN
0.0000 [IU] | Freq: Three times a day (TID) | SUBCUTANEOUS | Status: DC
  Administered 2016-08-25: 3 [IU] via SUBCUTANEOUS
  Administered 2016-08-26 – 2016-08-27 (×3): 2 [IU] via SUBCUTANEOUS

## 2016-08-25 MED ORDER — FUROSEMIDE 40 MG PO TABS
40.0000 mg | ORAL_TABLET | Freq: Every day | ORAL | Status: DC
  Administered 2016-08-25: 40 mg via ORAL
  Filled 2016-08-25 (×2): qty 1

## 2016-08-25 MED ORDER — SODIUM CHLORIDE 0.9% FLUSH
3.0000 mL | Freq: Two times a day (BID) | INTRAVENOUS | Status: DC
  Administered 2016-08-25 – 2016-08-26 (×3): 3 mL via INTRAVENOUS

## 2016-08-25 MED ORDER — SODIUM CHLORIDE 0.9 % IV SOLN: 250.0000 mL | INTRAVENOUS | Status: DC | PRN

## 2016-08-25 MED ORDER — MOVING RIGHT ALONG BOOK
Freq: Once | Status: AC
  Administered 2016-08-25: 09:00:00
  Filled 2016-08-25: qty 1

## 2016-08-25 MED ORDER — INSULIN ASPART 100 UNIT/ML ~~LOC~~ SOLN: 0.0000 [IU] | Freq: Every day | SUBCUTANEOUS | Status: DC

## 2016-08-25 NOTE — Progress Notes (Signed)
CT surgery p.m. Rounds  Patient had good day walked in the hallway Maintaining sinus rhythm Minimal incisional pain Waiting for step down bed Vital signs stable on room air

## 2016-08-25 NOTE — Progress Notes (Signed)
2 Days Post-Op Procedure(s) (LRB): TRANSESOPHAGEAL ECHOCARDIOGRAM (TEE) (N/A) AORTIC VALVE REPLACEMENT (AVR) - using partial Upper Sternotomy- 22mm Edwards Intuity Aortic Valve used (N/A) Subjective: nsr walking  Doing well  Objective: Vital signs in last 24 hours: Temp:  [97.7 F (36.5 C)-100.2 F (37.9 C)] 99.9 F (37.7 C) (03/04 0400) Pulse Rate:  [63-90] 78 (03/04 0800) Cardiac Rhythm: Normal sinus rhythm (03/04 0400) Resp:  [12-24] 20 (03/04 0800) BP: (90-148)/(38-114) 129/114 (03/04 0800) SpO2:  [92 %-99 %] 96 % (03/04 0800) Arterial Line BP: (121-150)/(44-52) 121/44 (03/03 1100) Weight:  [232 lb 11.2 oz (105.6 kg)] 232 lb 11.2 oz (105.6 kg) (03/04 0600)  Hemodynamic parameters for last 24 hours:    Intake/Output from previous day: 03/03 0701 - 03/04 0700 In: 3030.2 [P.O.:2180; I.V.:435.2; IV Piggyback:415] Out: 2210 [Urine:2000; Chest Tube:210] Intake/Output this shift: Total I/O In: 40 [I.V.:40] Out: 60 [Urine:60]       Exam    General- alert and comfortable   Lungs- clear without rales, wheezes   Cor- regular rate and rhythm, no murmur , gallop   Abdomen- soft, non-tender   Extremities - warm, non-tender, minimal edema   Neuro- oriented, appropriate, no focal weakness   Lab Results:  Recent Labs  08/24/16 1628 08/24/16 1631 08/25/16 0415  WBC 18.6*  --  16.9*  HGB 9.5* 9.9* 9.3*  HCT 30.2* 29.0* 28.7*  PLT 163  --  152   BMET:  Recent Labs  08/24/16 0317  08/24/16 1631 08/25/16 0415  NA 138  --  134* 132*  K 3.8  --  3.7 4.0  CL 106  --  97* 100*  CO2 22  --   --  25  GLUCOSE 122*  --  155* 134*  BUN 10  --  11 11  CREATININE 0.52  < > 0.50 0.65  CALCIUM 7.8*  --   --  8.1*  < > = values in this interval not displayed.  PT/INR:  Recent Labs  08/23/16 1230  LABPROT 18.2*  INR 1.49   ABG    Component Value Date/Time   PHART 7.283 (L) 08/23/2016 1753   HCO3 22.3 08/23/2016 1753   TCO2 25 08/24/2016 1631   ACIDBASEDEF 4.0 (H)  08/23/2016 1753   O2SAT 90.0 08/23/2016 1753   CBG (last 3)   Recent Labs  08/24/16 2125 08/25/16 0050 08/25/16 0434  GLUCAP 125* 139* 118*    Assessment/Plan: S/P Procedure(s) (LRB): TRANSESOPHAGEAL ECHOCARDIOGRAM (TEE) (N/A) AORTIC VALVE REPLACEMENT (AVR) - using partial Upper Sternotomy- 58mm Edwards Intuity Aortic Valve used (N/A) WBC improved Ready for tx to stepdown   LOS: 2 days    [name] 08/25/2016

## 2016-08-25 NOTE — Progress Notes (Signed)
Post Cardiac Surgery booklet and Moving Right Along Booklet given to patient.

## 2016-08-26 ENCOUNTER — Inpatient Hospital Stay (HOSPITAL_COMMUNITY): Payer: Medicare Other

## 2016-08-26 ENCOUNTER — Encounter (HOSPITAL_COMMUNITY): Payer: Self-pay | Admitting: Thoracic Surgery (Cardiothoracic Vascular Surgery)

## 2016-08-26 LAB — BASIC METABOLIC PANEL
Anion gap: 5 (ref 5–15)
BUN: 15 mg/dL (ref 6–20)
CO2: 26 mmol/L (ref 22–32)
Calcium: 7.8 mg/dL — ABNORMAL LOW (ref 8.9–10.3)
Chloride: 104 mmol/L (ref 101–111)
Creatinine, Ser: 0.61 mg/dL (ref 0.44–1.00)
GFR calc Af Amer: >60 mL/min (ref >60)
GFR calc non Af Amer: >60 mL/min (ref >60)
Glucose, Bld: 137 mg/dL — ABNORMAL HIGH (ref 65–99)
Potassium: 3.7 mmol/L (ref 3.5–5.1)
Sodium: 135 mmol/L (ref 135–145)

## 2016-08-26 LAB — GLUCOSE, CAPILLARY
Glucose-Capillary: 130 mg/dL — ABNORMAL HIGH (ref 65–99)
Glucose-Capillary: 101 mg/dL — ABNORMAL HIGH (ref 65–99)
Glucose-Capillary: 130 mg/dL — ABNORMAL HIGH (ref 65–99)
Glucose-Capillary: 109 mg/dL — ABNORMAL HIGH (ref 65–99)

## 2016-08-26 LAB — CBC
HCT: 27.9 % — ABNORMAL LOW (ref 36.0–46.0)
Hemoglobin: 9 g/dL — ABNORMAL LOW (ref 12.0–15.0)
MCH: 27.4 pg (ref 26.0–34.0)
MCHC: 32.3 g/dL (ref 30.0–36.0)
MCV: 84.8 fL (ref 78.0–100.0)
Platelets: 153 10*3/uL (ref 150–400)
RBC: 3.29 MIL/uL — ABNORMAL LOW (ref 3.87–5.11)
RDW: 13.9 % (ref 11.5–15.5)
WBC: 11.8 10*3/uL — ABNORMAL HIGH (ref 4.0–10.5)

## 2016-08-26 MED ORDER — FUROSEMIDE 40 MG PO TABS
40.0000 mg | ORAL_TABLET | Freq: Two times a day (BID) | ORAL | Status: DC
  Administered 2016-08-26 – 2016-08-27 (×2): 40 mg via ORAL
  Filled 2016-08-26 (×2): qty 1

## 2016-08-26 MED ORDER — ASPIRIN EC 325 MG PO TBEC
325.0000 mg | DELAYED_RELEASE_TABLET | Freq: Every day | ORAL | Status: DC
  Administered 2016-08-26 – 2016-08-27 (×2): 325 mg via ORAL
  Filled 2016-08-26 (×2): qty 1

## 2016-08-26 MED ORDER — VENLAFAXINE HCL ER 150 MG PO CP24
150.0000 mg | ORAL_CAPSULE | Freq: Every day | ORAL | Status: DC
  Administered 2016-08-26 – 2016-08-27 (×2): 150 mg via ORAL
  Filled 2016-08-26 (×2): qty 1

## 2016-08-26 MED ORDER — NABUMETONE 500 MG PO TABS
1500.0000 mg | ORAL_TABLET | Freq: Every day | ORAL | Status: DC
  Administered 2016-08-26: 1500 mg via ORAL
  Filled 2016-08-26 (×2): qty 3

## 2016-08-26 MED ORDER — POTASSIUM CHLORIDE CRYS ER 20 MEQ PO TBCR
20.0000 meq | EXTENDED_RELEASE_TABLET | Freq: Two times a day (BID) | ORAL | Status: DC
  Administered 2016-08-26 – 2016-08-27 (×3): 20 meq via ORAL
  Filled 2016-08-26 (×3): qty 1

## 2016-08-26 MED ORDER — POTASSIUM CHLORIDE CRYS ER 20 MEQ PO TBCR: 20.0000 meq | EXTENDED_RELEASE_TABLET | Freq: Four times a day (QID) | ORAL | Status: AC

## 2016-08-26 MED ORDER — POTASSIUM CHLORIDE 20 MEQ/15ML (10%) PO SOLN
20.0000 meq | Freq: Four times a day (QID) | ORAL | Status: DC
  Administered 2016-08-26: 20 meq via ORAL
  Filled 2016-08-26: qty 15

## 2016-08-26 MED ORDER — POTASSIUM CHLORIDE CRYS ER 20 MEQ PO TBCR: 20.0000 meq | EXTENDED_RELEASE_TABLET | Freq: Four times a day (QID) | ORAL | Status: DC

## 2016-08-26 MED ORDER — PSYLLIUM 95 % PO PACK
1.0000 | PACK | Freq: Every day | ORAL | Status: DC
  Administered 2016-08-27: 1 via ORAL
  Filled 2016-08-26 (×2): qty 1

## 2016-08-26 MED ORDER — POTASSIUM CHLORIDE 20 MEQ/15ML (10%) PO SOLN: 20.0000 meq | Freq: Four times a day (QID) | ORAL | Status: DC

## 2016-08-26 MED ORDER — METFORMIN HCL 500 MG PO TABS
500.0000 mg | ORAL_TABLET | Freq: Two times a day (BID) | ORAL | Status: DC
  Administered 2016-08-26 – 2016-08-27 (×3): 500 mg via ORAL
  Filled 2016-08-26 (×4): qty 1

## 2016-08-26 NOTE — Progress Notes (Signed)
Pt refuses CPAP for the night. RN told by this RT to call if pt changes her mind

## 2016-08-26 NOTE — Progress Notes (Signed)
      [address]       [place] [address]             [phone]        CARDIOTHORACIC SURGERY PROGRESS NOTE   R3 Days Post-Op Procedure(s) (LRB): TRANSESOPHAGEAL ECHOCARDIOGRAM (TEE) (N/A) AORTIC VALVE REPLACEMENT (AVR) - using partial Upper Sternotomy- 80mm Edwards Intuity Aortic Valve used (N/A)  Subjective: No complaints  Objective: Vital signs: BP Readings from Last 1 Encounters:  08/26/16 139/76   Pulse Readings from Last 1 Encounters:  08/26/16 98   Resp Readings from Last 1 Encounters:  08/26/16 16   Temp Readings from Last 1 Encounters:  08/26/16 99.6 F (37.6 C) (Oral)    Hemodynamics:    Physical Exam:  Rhythm:   sinus  Breath sounds: clear  Heart sounds:  RRR  Incisions:  Dressing dry, intact  Abdomen:  Soft, non-distended, non-tender  Extremities:  Warm, well-perfused    Intake/Output from previous day: 03/04 0701 - 03/05 0700 In: 1800 [P.O.:1680; I.V.:120] Out: 3535 [Urine:3535] Intake/Output this shift: Total I/O In: 120 [P.O.:120] Out: -   Lab Results:  CBC: Recent Labs  08/25/16 0415 08/26/16 0248  WBC 16.9* 11.8*  HGB 9.3* 9.0*  HCT 28.7* 27.9*  PLT 152 153    BMET:  Recent Labs  08/25/16 0415 08/26/16 0248  NA 132* 135  K 4.0 3.7  CL 100* 104  CO2 25 26  GLUCOSE 134* 137*  BUN 11 15  CREATININE 0.65 0.61  CALCIUM 8.1* 7.8*     PT/INR:   Recent Labs  08/23/16 1230  LABPROT 18.2*  INR 1.49    CBG (last 3)   Recent Labs  08/25/16 1654 08/25/16 2202 08/26/16 0738  GLUCAP 161* 106* 130*    ABG    Component Value Date/Time   PHART 7.283 (L) 08/23/2016 1753   PCO2ART 47.1 08/23/2016 1753   PO2ART 67.0 (L) 08/23/2016 1753   HCO3 22.3 08/23/2016 1753   TCO2 25 08/24/2016 1631   ACIDBASEDEF 4.0 (H) 08/23/2016 1753   O2SAT 90.0 08/23/2016 1753    CXR: CHEST  2 VIEW  COMPARISON:  Radiograph of August 25, 2016.  FINDINGS: Stable cardiomediastinal silhouette. Status post  aortic valve replacement. No pneumothorax is noted. Minimal bilateral pleural effusions are noted. Mild left basilar opacity is noted concerning for atelectasis or inflammation. Right upper lobe opacity is noted concerning for inflammation or atelectasis. Bony thorax is unremarkable.  IMPRESSION: Minimal bilateral pleural effusions. Mild left basilar opacity concerning for atelectasis or inflammation. Right upper lobe opacity concerning for atelectasis or inflammation.   Electronically Signed   By: Marijo Conception, M.D.   On: 08/26/2016 07:58  Assessment/Plan: S/P Procedure(s) (LRB): TRANSESOPHAGEAL ECHOCARDIOGRAM (TEE) (N/A) AORTIC VALVE REPLACEMENT (AVR) - using partial Upper Sternotomy- 60mm Edwards Intuity Aortic Valve used (N/A)  Doing well POD3 Maintaining NSR w/ stable BP Breathing comfortably w/ O2 sats 97-100% on 2 L/min Acute on chronic diastolic CHF with expected post-op volume excess, I/O's negative 1700 mL yesterday but weight still > preop Expected post op acute blood loss anemia, stable Type II diabetes mellitus, excellent glycemic control   Mobilize  Diuresis  Stop levemir and restart metformin  Transfer floor  Anticipate possible D/C home 1-2 days   Rexene Alberts, MD 08/26/2016 9:20 AM

## 2016-08-26 NOTE — Care Management Note (Signed)
Case Management Note  Patient Details  Name: [name] MRN: [id number] Date of Birth: 13-Sep-1950  Subjective/Objective:   Pt lives with husband who is retired, has cane, walker, and elevated toilet seat.  She is  ambulating with staff.                        Expected Discharge Plan:  Home/Self Care  Discharge planning Services  CM Consult  Status of Service:  In process, will continue to follow  Girard Cooter, RN 08/26/2016, 9:24 AM

## 2016-08-26 NOTE — Plan of Care (Signed)
0950 pacing wires removed per protocal, bedrest until 1050

## 2016-08-26 NOTE — Progress Notes (Signed)
[facility] BRIEF SICU PROGRESS NOTE  3 Days Post-Op  S/P Procedure(s) (LRB): TRANSESOPHAGEAL ECHOCARDIOGRAM (TEE) (N/A) AORTIC VALVE REPLACEMENT (AVR) - using partial Upper Sternotomy- 32mm Edwards Intuity Aortic Valve used (N/A)   Stable day Still waiting for bed to transfer out of ICU  Plan: Continue current plan  Rexene Alberts, MD 08/26/2016 6:54 PM

## 2016-08-27 LAB — GLUCOSE, CAPILLARY
Glucose-Capillary: 131 mg/dL — ABNORMAL HIGH (ref 65–99)
Glucose-Capillary: 115 mg/dL — ABNORMAL HIGH (ref 65–99)
Glucose-Capillary: 114 mg/dL — ABNORMAL HIGH (ref 65–99)

## 2016-08-27 MED ORDER — LACTULOSE 10 GM/15ML PO SOLN
20.0000 g | Freq: Once | ORAL | Status: AC
  Administered 2016-08-27: 20 g via ORAL
  Filled 2016-08-27: qty 30

## 2016-08-27 MED ORDER — LACTULOSE 10 GM/15ML PO SOLN
20.0000 g | Freq: Once | ORAL | Status: DC
  Filled 2016-08-27: qty 30

## 2016-08-27 MED ORDER — ACETAMINOPHEN 500 MG PO TABS: 1000.0000 mg | ORAL_TABLET | Freq: Four times a day (QID) | ORAL | 0 refills | Status: DC | PRN

## 2016-08-27 MED ORDER — OXYCODONE HCL 5 MG PO TABS: 5.0000 mg | ORAL_TABLET | ORAL | 0 refills | Status: DC | PRN

## 2016-08-27 MED ORDER — ASPIRIN 325 MG PO TBEC: 325.0000 mg | DELAYED_RELEASE_TABLET | Freq: Every day | ORAL | 0 refills | Status: DC

## 2016-08-27 MED ORDER — METOCLOPRAMIDE HCL 10 MG PO TABS
10.0000 mg | ORAL_TABLET | Freq: Four times a day (QID) | ORAL | Status: DC | PRN
  Administered 2016-08-27: 10 mg via ORAL
  Filled 2016-08-27: qty 1

## 2016-08-27 MED ORDER — FUROSEMIDE 40 MG PO TABS
40.0000 mg | ORAL_TABLET | Freq: Two times a day (BID) | ORAL | 0 refills | Status: DC
Start: 2016-08-27 — End: 2016-10-16

## 2016-08-27 MED ORDER — METOPROLOL TARTRATE 25 MG PO TABS: 12.5000 mg | ORAL_TABLET | Freq: Two times a day (BID) | ORAL | 3 refills | Status: DC

## 2016-08-27 MED ORDER — POTASSIUM CHLORIDE CRYS ER 20 MEQ PO TBCR: 20.0000 meq | EXTENDED_RELEASE_TABLET | Freq: Two times a day (BID) | ORAL | 0 refills | Status: DC

## 2016-08-27 NOTE — Care Management Important Message (Signed)
Important Message  Patient Details  Name: [name] MRN: [id number] Date of Birth: 02-Jun-1951   Medicare Important Message Given:  Yes    Nashaun Hillmer T, RN 08/27/2016, 2:24 PM

## 2016-08-27 NOTE — Plan of Care (Signed)
No pencil to mark next doses on discharge summary, so next dose wrote in.

## 2016-08-27 NOTE — Plan of Care (Signed)
Patient c/o nausea and cramping, still no BM, refused second dose of lactulose, Erin PA aware

## 2016-08-27 NOTE — Plan of Care (Signed)
Sutures removed from 2 CT sites, discussed with Erin PA, if no BM in 2 hours repeat lactulose dose and discharge pt.

## 2016-08-27 NOTE — Plan of Care (Signed)
Patient states she is cramping , nauseated, and she is diaphoretic.  Refuses to go home. Patient's husband uncomfortable to take her home. Erin PA paged and aware, to get back with me

## 2016-08-27 NOTE — Progress Notes (Signed)
      [address]       [place] [address]             [phone]        CARDIOTHORACIC SURGERY PROGRESS NOTE   R4 Days Post-Op Procedure(s) (LRB): TRANSESOPHAGEAL ECHOCARDIOGRAM (TEE) (N/A) AORTIC VALVE REPLACEMENT (AVR) - using partial Upper Sternotomy- 3mm Edwards Intuity Aortic Valve used (N/A)  Subjective: No complaints  Objective: Vital signs: BP Readings from Last 1 Encounters:  08/27/16 (!) 141/65   Pulse Readings from Last 1 Encounters:  08/27/16 92   Resp Readings from Last 1 Encounters:  08/27/16 (!) 24   Temp Readings from Last 1 Encounters:  08/27/16 98.3 F (36.8 C) (Oral)    Hemodynamics:    Physical Exam:  Rhythm:   sinus  Breath sounds: clear  Heart sounds:  RRR  Incisions:  Clean and dry  Abdomen:  Soft, non-distended, non-tender  Extremities:  Warm, well-perfused   Intake/Output from previous day: 03/05 0701 - 03/06 0700 In: 1800 [P.O.:1800] Out: 1050 [Urine:1050] Intake/Output this shift: Total I/O In: 360 [P.O.:360] Out: -   Lab Results:  CBC: Recent Labs  08/25/16 0415 08/26/16 0248  WBC 16.9* 11.8*  HGB 9.3* 9.0*  HCT 28.7* 27.9*  PLT 152 153    BMET:  Recent Labs  08/25/16 0415 08/26/16 0248  NA 132* 135  K 4.0 3.7  CL 100* 104  CO2 25 26  GLUCOSE 134* 137*  BUN 11 15  CREATININE 0.65 0.61  CALCIUM 8.1* 7.8*     PT/INR:  No results for input(s): LABPROT, INR in the last 72 hours.  CBG (last 3)   Recent Labs  08/26/16 1543 08/26/16 2139 08/27/16 0837  GLUCAP 130* 109* 131*    ABG    Component Value Date/Time   PHART 7.283 (L) 08/23/2016 1753   PCO2ART 47.1 08/23/2016 1753   PO2ART 67.0 (L) 08/23/2016 1753   HCO3 22.3 08/23/2016 1753   TCO2 25 08/24/2016 1631   ACIDBASEDEF 4.0 (H) 08/23/2016 1753   O2SAT 90.0 08/23/2016 1753    CXR: n/a  Assessment/Plan: S/P Procedure(s) (LRB): TRANSESOPHAGEAL ECHOCARDIOGRAM (TEE) (N/A) AORTIC VALVE REPLACEMENT (AVR) - using partial  Upper Sternotomy- 39mm Edwards Intuity Aortic Valve used (N/A)  Doing well  D/C home today Continue lasix 40 mg BID x 7 days then cut back to 40 mg daily Instructions given  Rexene Alberts, MD 08/27/2016 9:17 AM

## 2016-08-27 NOTE — Progress Notes (Signed)
[id number] Pt walked this morning and is nauseated now. Did not walk again at this time due to not feeling well. Education completed with pt and husband who voiced understanding. Many pertinent questions. Reviewed sternal precautions, IS and ex ed. Pt follows diabetic diet. Encouraged her to watch sodium also. Referring to [place] Phase 2. Wrote down how to view discharge video if she wants to see prior to discharge. Graylon Good RN BSN 08/27/2016 12:16 PM

## 2016-08-27 NOTE — Plan of Care (Signed)
Discharged pt home with husband. Instructions reviewed with pt and her husband

## 2016-08-27 NOTE — Care Management Note (Signed)
Case Management Note  Patient Details  Name: [name] MRN: [id number] Date of Birth: 1950-11-22  Subjective/Objective:   Pt discharging home with husband, no discharge needs identified.                        Expected Discharge Plan:  Home/Self Care  Discharge planning Services  CM Consult  Status of Service:  Completed, signed off  Girard Cooter, [place] 08/27/2016, 2:25 PM

## 2016-08-27 NOTE — Discharge Summary (Signed)
Physician Discharge Summary  [name] ID: Hayley [name] MRN: [id number] DOB/AGE: 06/28/1950 66 y.o.  Admit date: 08/23/2016 Discharge date: 08/27/2016  Admission Diagnoses:  [name] Active Problem List   Diagnosis Date Noted  . Chronic diastolic congestive heart failure (HCC)   . Severe aortic stenosis 07/08/2016  . Osteopenia 05/27/2016  . OSA (obstructive sleep apnea) 02/27/2016  . Bruxism 10/23/2015  . Elevated alkaline phosphatase level 05/16/2015  . Atypical chest pain 11/10/2014  . Acne 05/12/2014  . Diabetes mellitus type 2, controlled (HCC) 05/12/2014  . GERD (gastroesophageal reflux disease) 03/10/2014  . Generalized anxiety disorder 03/10/2014  . Migraines 03/10/2014  . EUSTACHIAN TUBE DYSFUNCTION, CHRONIC 06/14/2010  . Irritable bowel syndrome 12/15/2008  . OBESITY, MORBID 10/12/2008  . Low back pain 08/11/2008  . Hyperlipidemia 02/09/2008  . Allergic rhinitis 02/12/2007  . ASTHMA 02/12/2007   Discharge Diagnoses:   [name] Active Problem List   Diagnosis Date Noted  . S/P minimally invasive aortic valve replacement with bioprosthetic valve 08/23/2016  . Chronic diastolic congestive heart failure (HCC)   . Severe aortic stenosis 07/08/2016  . Osteopenia 05/27/2016  . OSA (obstructive sleep apnea) 02/27/2016  . Bruxism 10/23/2015  . Elevated alkaline phosphatase level 05/16/2015  . Atypical chest pain 11/10/2014  . Acne 05/12/2014  . Diabetes mellitus type 2, controlled (HCC) 05/12/2014  . GERD (gastroesophageal reflux disease) 03/10/2014  . Generalized anxiety disorder 03/10/2014  . Migraines 03/10/2014  . EUSTACHIAN TUBE DYSFUNCTION, CHRONIC 06/14/2010  . Irritable bowel syndrome 12/15/2008  . OBESITY, MORBID 10/12/2008  . Low back pain 08/11/2008  . Hyperlipidemia 02/09/2008  . Allergic rhinitis 02/12/2007  . ASTHMA 02/12/2007   Discharged Condition: good  History of Present Illness:  Hayley [name] 66-year-old obese female with history of aortic  stenosis, atypical chest pain, obstructive sleep apnea on CPAP, GE reflux disease, and type 2 diabetes mellitus who has been referred for surgical consultation to discuss treatment options for management of severe symptomatic aortic stenosis. Hayley [name] has been obese for Hayley majority of her adult life. Hayley [name] was followed for many years by Dr. Jenkins but more recently has been followed by Dr. Hunter subsequent to Dr. Jenkins retirement. Dr. Hunter noted a systolic murmur on physical exam and transthoracic echocardiogram performed in 2015 revealed normal left ventricular systolic function with moderate aortic stenosis, mean transvalvular gradient estimated 30 mmHg.. Hayley [name] began to experience symptoms of chest pain and was referred to Dr. Nelson for cardiology consultation in 2016. Nuclear stress test was performed and felt to be low risk with ejection fraction measured 60%. Hayley [name] was started on low-dose aspirin and has been followed ever since by Dr. Nelson. Hayley [name] was seen in follow-up recently and noted to complain of progressive symptoms of exertional shortness of breath with worsening fatigue. Follow-up echocardiogram was performed 07/05/2016 revealed significant progression of aortic stenosis. Left ventricular function remained normal with ejection fraction estimated 55-60%. Peak velocity across Hayley aortic valve was measured as high as 4.4 m/s corresponding to mean transvalvular gradient estimated 43mmHg.  Hayley [name] was referred to Dr. Owen for surgical consultation.  Hayley [name] was evaluated on 07/16/2016 at which time he felt Hayley [name] would be a surgical candidate.  However Hayley [name] would require further imaging studies prior to proceeding.  Cardiac catheterization was done and showed non obstruction CAD.  Gated CT scan confirmed presence of a bicuspid aortic valve.  Hayley [name] was offered Aortic Valve replacement.   Hayley risks and benefits of Hayley procedure were explained to Hayley [name] and   Hayley [name] was agreeable to  proceed.  Hospital Course:   [name] presented to [facility] on 08/23/2016.  Hayley [name] was taken to Hayley operating room and underwent Mini Sternotomy with Aortic Valve Replacement utilizing a 25 mm Edwards Intuity Aortic Valve.  Hayley [name] tolerated Hayley procedure without difficulty and was taken to Hayley SICU in stable condition.  Hayley [name] was extubated Hayley evening of surgery.  During her stay in Hayley SICU Hayley [name] was weaned off drips as tolerated.  Her chest tubes and arterial lines were removed without difficulty.  Hayley [name] was maintaining NSR.  Her temporary pacing wires were removed prior to discharge.  Hayley [name] was aggressively diuresed for hypervolemia.  Hayley [name] was medically stable for discharge to Hayley floor on POD #3.  However, no bed became available.  Hayley [name] was ambulating independently.  Hayley [name] continues to maintain NSR.  Her incisions are healing without evidence of infection and her pain is well controlled.  Hayley [name] is felt medically stable for discharge home today.      Significant Diagnostic Studies: cardiac graphics:   Echocardiogram:   Left ventricle: Hayley cavity size was normal. Wall thickness was   increased in a pattern of moderate LVH. Systolic function was   normal. Hayley estimated ejection fraction was in Hayley range of 55%   to 60%. Wall motion was normal; there were no regional wall   motion abnormalities. Doppler parameters are consistent with   abnormal left ventricular relaxation (grade 1 diastolic   dysfunction). - Aortic valve: Functionally bicuspid; severely thickened, severely   calcified leaflets. There was severe stenosis. - Atrial septum: No defect or patent foramen ovale was identified.   Treatments: surgery:    Minimally Invasive Aortic Valve Replacement             Partial Upper Mini-sternotomy             Edwards Intuity Elite rapid deployment bovine pericardial tissue valve (size 25 mm, model # 8300AB, serial # [id number])  Disposition: 01-Home or Self Care   Discharge  medications:     Allergies as of 08/27/2016      Reactions   Erythromycin Nausea Only   Penicillins Itching, Rash   Has [name] had a PCN reaction causing immediate rash, facial/tongue/throat swelling, SOB or lightheadedness with hypotension: No Has [name] had a PCN reaction causing severe rash involving mucus membranes or skin necrosis: No Has [name] had a PCN reaction that required hospitalization: No Has [name] had a PCN reaction occurring within Hayley last 10 years: No  If all of Hayley above answers are "NO", then may proceed with Cephalosporin use.      Medication List    STOP taking these medications   aspirin 81 MG tablet Replaced by:  aspirin 325 MG EC tablet     TAKE these medications   acetaminophen 500 MG tablet Commonly known as:  TYLENOL Take 2 tablets (1,000 mg total) by mouth every 6 (six) hours as needed.   ALIGN 4 MG Caps Take 4 mg by mouth daily.   aspirin 325 MG EC tablet Take 1 tablet (325 mg total) by mouth daily. Start taking on:  08/28/2016 Replaces:  aspirin 81 MG tablet   atorvastatin 40 MG tablet Commonly known as:  LIPITOR TAKE 1 TABLET BY MOUTH AT NIGHT ON TUESDAY AND FRIDAY   Biotin 5000 MCG Tabs Take 5,000 mcg by mouth daily.   calcium-vitamin D 500-200 MG-UNIT tablet Commonly known as:  OSCAL WITH D Take 2 tablets by mouth daily.  clindamycin 300 MG capsule Commonly known as:  CLEOCIN Take 300 mg by mouth once. Take 2 cap 1 hour before dental procedures   DELSYM 30 MG/5ML liquid Generic drug:  dextromethorphan Take 30 mg by mouth at bedtime.   fluticasone 50 MCG/ACT nasal spray Commonly known as:  FLONASE Place 2 sprays into both nostrils daily.   furosemide 40 MG tablet Commonly known as:  LASIX Take 1 tablet (40 mg total) by mouth 2 (two) times daily. For 7 Days, then decrease to 40 mg daily   glucose blood test strip Commonly known as:  ONETOUCH VERIO Use to test blood sugars daily. Dx: E11.9   ipratropium 0.03 % nasal  spray Commonly known as:  ATROVENT Place 2 sprays into both nostrils 2 (two) times daily.   loratadine 10 MG tablet Commonly known as:  CLARITIN Take 10 mg by mouth at bedtime.   metFORMIN 500 MG tablet Commonly known as:  GLUCOPHAGE TAKE 1 TABLET WITH BREAKFAST AND 1/2 TO 1 TABLET WITH DINNER. What changed:  See Hayley new instructions.   metoCLOPramide 10 MG tablet Commonly known as:  REGLAN TAKE 1/2 TABLET BY MOUTH 4 TIMES A DAY What changed:  See Hayley new instructions.   metoprolol tartrate 25 MG tablet Commonly known as:  LOPRESSOR Take 0.5 tablets (12.5 mg total) by mouth 2 (two) times daily.   multivitamin with minerals Tabs tablet Take 1 tablet by mouth daily.   nabumetone 750 MG tablet Commonly known as:  RELAFEN TAKE 2 TABLETS BY MOUTH EVERY DAY What changed:  See Hayley new instructions.   omeprazole 20 MG tablet Commonly known as:  PRILOSEC OTC Take 40 mg by mouth daily with breakfast.   onetouch ultrasoft lancets Use to test blood sugars daily. Dx: E11.9   ONETOUCH VERIO w/Device Kit 1 kit by Does not apply route once.   oxyCODONE 5 MG immediate release tablet Commonly known as:  Oxy IR/ROXICODONE Take 1-2 tablets (5-10 mg total) by mouth every 4 (four) hours as needed for severe pain.   potassium chloride SA 20 MEQ tablet Commonly known as:  K-DUR,KLOR-CON Take 1 tablet (20 mEq total) by mouth 2 (two) times daily. For 7 days then decrease to 20 mg daily   psyllium 28 % packet Commonly known as:  METAMUCIL SMOOTH TEXTURE Take 1 packet by mouth daily before breakfast.   SUMAtriptan 100 MG tablet Commonly known as:  IMITREX TAKE 1 TABLET BY MOUTH EVERY 2 HOURS AS NEEDED FOR MIGRAINE What changed:  how much to take  how to take this  when to take this  reasons to take this  additional instructions   venlafaxine XR 150 MG 24 hr capsule Commonly known as:  EFFEXOR-XR TAKE ONE CAPSULE BY MOUTH Sigourney, MD Follow up on 09/16/2016.   Specialty:  Cardiothoracic Surgery Why:  Appointment is at 4:30, please get CXR at 4:00 at [place] located on first floor of our office building  Contact information: [place] [address] [phone]        Ena Dawley, MD Follow up on 09/16/2016.   Specialty:  Cardiology Why:  Appointment is at 2:45 Contact information: [address] [place] [address] [phone]           Signed: Ellwood Handler 08/27/2016, 10:53 AM

## 2016-08-27 NOTE — Plan of Care (Signed)
Patient feels so much better and wants to go home now. Eric PA notified and says it is OK to discharge home

## 2016-08-27 NOTE — Discharge Instructions (Signed)
Aortic Valve Replacement, Care After °Refer to this sheet in the next few weeks. These instructions provide you with information about caring for yourself after your procedure. Your health care provider may also give you more specific instructions. Your treatment has been planned according to current medical practices, but problems sometimes occur. Call your health care provider if you have any problems or questions after your procedure. °What can I expect after the procedure? °After the procedure, it is common to have: °· Pain around your incision area. °· A small amount of blood or clear fluid coming from your incision. ° °Follow these instructions at home: °Eating and drinking ° °· Follow instructions from your health care provider about eating or drinking restrictions. °? Limit alcohol intake to no more than 1 drink per day for nonpregnant women and 2 drinks per day for men. One drink equals 12 oz of beer, 5 oz of wine, or 1½ oz of hard liquor. °? Limit how much caffeine you drink. Caffeine can affect your heart's rate and rhythm. °· Drink enough fluid to keep your urine clear or pale yellow. °· Eat a heart-healthy diet. This should include plenty of fresh fruits and vegetables. If you eat meat, it should be lean cuts. Avoid foods that are: °? High in salt, saturated fat, or sugar. °? Canned or highly processed. °? Fried. °Activity °· Return to your normal activities as told by your health care provider. Ask your health care provider what activities are safe for you. °· Exercise regularly once you have recovered, as told by your health care provider. °· Avoid sitting for more than 2 hours at a time without moving. Get up and move around at least once every 1-2 hours. This helps to prevent blood clots in the legs. °· Do not lift anything that is heavier than 10 lb (4.5 kg) until your health care provider approves. °· Avoid pushing or pulling things with your arms until your health care provider approves. This  includes pulling on handrails to help you climb stairs. °Incision care ° °· Follow instructions from your health care provider about how to take care of your incision. Make sure you: °? Wash your hands with soap and water before you change your bandage (dressing). If soap and water are not available, use hand sanitizer. °? Change your dressing as told by your health care provider. °? Leave stitches (sutures), skin glue, or adhesive strips in place. These skin closures may need to stay in place for 2 weeks or longer. If adhesive strip edges start to loosen and curl up, you may trim the loose edges. Do not remove adhesive strips completely unless your health care provider tells you to do that. °· Check your incision area every day for signs of infection. Check for: °? More redness, swelling, or pain. °? More fluid or blood. °? Warmth. °? Pus or a bad smell. °Medicines °· Take over-the-counter and prescription medicines only as told by your health care provider. °· If you were prescribed an antibiotic medicine, take it as told by your health care provider. Do not stop taking the antibiotic even if you start to feel better. °Travel °· Avoid airplane travel for as long as told by your health care provider. °· When you travel, bring a list of your medicines and a record of your medical history with you. Carry your medicines with you. °Driving °· Ask your health care provider when it is safe for you to drive. Do not drive until your health   care provider approves. °· Do not drive or operate heavy machinery while taking prescription pain medicine. °Lifestyle ° °· Do not use any tobacco products, such as cigarettes, chewing tobacco, or e-cigarettes. If you need help quitting, ask your health care provider. °· Resume sexual activity as told by your health care provider. Do not use medicines for erectile dysfunction unless your health care provider approves, if this applies. °· Work with your health care provider to keep your  blood pressure and cholesterol under control, and to manage any other heart conditions that you have. °· Maintain a healthy weight. °General instructions °· Do not take baths, swim, or use a hot tub until your health care provider approves. °· Do not strain to have a bowel movement. °· Avoid crossing your legs while sitting down. °· Check your temperature every day for a fever. A fever may be a sign of infection. °· If you are a woman and you plan to become pregnant, talk with your health care provider before you become pregnant. °· Wear compression stockings if your health care provider instructs you to do this. These stockings help to prevent blood clots and reduce swelling in your legs. °· Tell all health care providers who care for you that you have an artificial (prosthetic) aortic valve. If you have or have had heart disease or endocarditis, tell all health care providers about these conditions as well. °· Keep all follow-up visits as told by your health care provider. This is important. °Contact a health care provider if: °· You develop a skin rash. °· You experience sudden, unexplained changes in your weight. °· You have more redness, swelling, or pain around your incision. °· You have more fluid or blood coming from your incision. °· Your incision feels warm to the touch. °· You have pus or a bad smell coming from your incision. °· You have a fever. °Get help right away if: °· You develop chest pain that is different from the pain coming from your incision. °· You develop shortness of breath or difficulty breathing. °· You start to feel light-headed. °These symptoms may represent a serious problem that is an emergency. Do not wait to see if the symptoms will go away. Get medical help right away. Call your local emergency services (911 in the [place] Do not drive yourself to the hospital. °This information is not intended to replace advice given to you by your health care provider. Make sure you discuss any  questions you have with your health care provider. °Document Released: 12/27/2004 Document Revised: 11/16/2015 Document Reviewed: 05/14/2015 °Elsevier Interactive Patient Education © 2017 [employer] ° °

## 2016-08-28 ENCOUNTER — Telehealth (HOSPITAL_COMMUNITY): Payer: Self-pay | Admitting: Family Medicine

## 2016-08-28 MED FILL — Lidocaine HCl IV Inj 20 MG/ML: INTRAVENOUS | Qty: 5 | Status: AC

## 2016-08-28 MED FILL — Sodium Bicarbonate IV Soln 8.4%: INTRAVENOUS | Qty: 50 | Status: AC

## 2016-08-28 MED FILL — Mannitol IV Soln 20%: INTRAVENOUS | Qty: 500 | Status: AC

## 2016-08-28 MED FILL — Electrolyte-R (PH 7.4) Solution: INTRAVENOUS | Qty: 4000 | Status: AC

## 2016-08-28 MED FILL — Sodium Chloride IV Soln 0.9%: INTRAVENOUS | Qty: 2000 | Status: AC

## 2016-08-28 MED FILL — Heparin Sodium (Porcine) Inj 1000 Unit/ML: INTRAMUSCULAR | Qty: 10 | Status: AC

## 2016-08-28 NOTE — Telephone Encounter (Signed)
Patient insurance benefits verified for Cardiac Rehab, patient has [employer]. Patient has $20 co-payment, no deductible, out of pocket max $4000/$562.22 has been met, 0% co-insurance and no limit on visits. Passport/reference # [id number]. Information given to [place] to review.

## 2016-08-29 MED FILL — Heparin Sodium (Porcine) Inj 1000 Unit/ML: INTRAMUSCULAR | Qty: 2500 | Status: AC

## 2016-09-03 ENCOUNTER — Ambulatory Visit: Payer: Medicare Other | Admitting: Pulmonary Disease

## 2016-09-05 ENCOUNTER — Encounter: Payer: Self-pay | Admitting: Family Medicine

## 2016-09-05 ENCOUNTER — Encounter: Payer: Self-pay | Admitting: Cardiology

## 2016-09-10 ENCOUNTER — Ambulatory Visit (INDEPENDENT_AMBULATORY_CARE_PROVIDER_SITE_OTHER): Payer: Self-pay | Admitting: Physician Assistant

## 2016-09-10 VITALS — BP 137/77 | HR 83 | Resp 16 | Ht 65.5 in | Wt 215.0 lb

## 2016-09-10 DIAGNOSIS — Z952 Presence of prosthetic heart valve: Secondary | ICD-10-CM

## 2016-09-10 MED ORDER — OXYCODONE HCL 5 MG PO TABS: 5.0000 mg | ORAL_TABLET | Freq: Every evening | ORAL | 0 refills | Status: DC | PRN

## 2016-09-10 NOTE — Progress Notes (Signed)
Mariza ADALIS GATTI is a 66 y.o. female patient who presents for a nursing appointment for a wound check.  No diagnosis found. Past Medical History:  Diagnosis Date  . Allergy   . Anxiety   . Arthritis    back & knee  . Asthma     mild per pt shows up with resp illness  . Chronic diastolic congestive heart failure ([place])   . Colon polyps   . Diabetes mellitus without complication ([place])   . Gastric polyps   . Gastroparesis   . GERD (gastroesophageal reflux disease)   . Headache    sinus headaches and migraines at times  . Heart murmur   . History of migraine headaches   . Hyperlipidemia   . IBS (irritable bowel syndrome)   . Lumbar disc disease   . S/P aortic valve replacement with bioprosthetic valve 08/23/2016   25 mm Edwards Intuity rapid-deployment bovine pericardial tissue valve via partial upper mini sternotomy  . Sleep apnea    Past Surgical History Pertinent Negatives:  Procedure Date Noted  . SPLENECTOMY 04/09/2012   Scheduled Meds: Continuous Infusions: PRN Meds:  Allergies  Allergen Reactions  . Erythromycin Nausea Only  . Penicillins Itching and Rash    Has patient had a PCN reaction causing immediate rash, facial/tongue/throat swelling, SOB or lightheadedness with hypotension: No Has patient had a PCN reaction causing severe rash involving mucus membranes or skin necrosis: No Has patient had a PCN reaction that required hospitalization: No Has patient had a PCN reaction occurring within the last 10 years: No  If all of the above answers are "NO", then may proceed with Cephalosporin use.    Active Problems:   * No active hospital problems. *  Blood pressure 137/77, pulse 83, resp. rate 16, height 5' 5.5" (1.664 m), weight 97.5 kg (215 lb), SpO2 96 %.  Subjective: She is having some incision and back pain at night.   Objective:  Heart: NSR without murmur Lung: CTA bilaterally, diminished lower lobes Abd: nontender Extremities: no pedal edema Incision:  sternal incision is healing well without drainage. Lower chest tube incision is open about 1cm deep. It was packed today with sterile gauze and dressed by the nurse.   Assessment & Plan: Over all she is doing well. She is able to do more each day and feels she is progressing. She only is taking the pain medication at night now, but does need it to sleep. She has back issues anyway's and is very uncomfortable at night. I gave her some tips about putting a pillow under her knees for support or laying in a supported side position. She plans to try some new sleeping positions tonight. She was given instructions about packing her chest tube incision by the nurse and all questions were answered to her and her husbands satisfaction. She will follow-up in the office on Monday for her routine visit. I gave her a prescription for 15 pills of oxycodone to get her by until Monday.   Elgie Collard 09/10/2016

## 2016-09-10 NOTE — Patient Instructions (Signed)
Please follow-up on Monday for your routine appointment.

## 2016-09-12 ENCOUNTER — Other Ambulatory Visit: Payer: Self-pay | Admitting: Thoracic Surgery (Cardiothoracic Vascular Surgery)

## 2016-09-12 DIAGNOSIS — I350 Nonrheumatic aortic (valve) stenosis: Secondary | ICD-10-CM

## 2016-09-16 ENCOUNTER — Encounter: Payer: Self-pay | Admitting: Cardiology

## 2016-09-16 ENCOUNTER — Ambulatory Visit (INDEPENDENT_AMBULATORY_CARE_PROVIDER_SITE_OTHER): Payer: Medicare Other | Admitting: Cardiology

## 2016-09-16 ENCOUNTER — Encounter: Payer: Self-pay | Admitting: Thoracic Surgery (Cardiothoracic Vascular Surgery)

## 2016-09-16 ENCOUNTER — Encounter: Payer: Self-pay | Admitting: Family Medicine

## 2016-09-16 ENCOUNTER — Ambulatory Visit (INDEPENDENT_AMBULATORY_CARE_PROVIDER_SITE_OTHER): Payer: Self-pay | Admitting: Thoracic Surgery (Cardiothoracic Vascular Surgery)

## 2016-09-16 ENCOUNTER — Ambulatory Visit
Admission: RE | Admit: 2016-09-16 | Discharge: 2016-09-16 | Disposition: A | Discharge status: Home / Self Care | Payer: Medicare Other | Source: Ambulatory Visit | Attending: Thoracic Surgery (Cardiothoracic Vascular Surgery) | Admitting: Thoracic Surgery (Cardiothoracic Vascular Surgery)

## 2016-09-16 VITALS — BP 116/62 | HR 76 | Ht 65.5 in | Wt 220.0 lb

## 2016-09-16 VITALS — BP 116/67 | HR 79 | Resp 16 | Ht 65.5 in | Wt 215.0 lb

## 2016-09-16 DIAGNOSIS — E782 Mixed hyperlipidemia: Secondary | ICD-10-CM

## 2016-09-16 DIAGNOSIS — I350 Nonrheumatic aortic (valve) stenosis: Secondary | ICD-10-CM | POA: Diagnosis not present

## 2016-09-16 DIAGNOSIS — Z953 Presence of xenogenic heart valve: Secondary | ICD-10-CM

## 2016-09-16 DIAGNOSIS — I5032 Chronic diastolic (congestive) heart failure: Secondary | ICD-10-CM | POA: Diagnosis not present

## 2016-09-16 DIAGNOSIS — D508 Other iron deficiency anemias: Secondary | ICD-10-CM

## 2016-09-16 DIAGNOSIS — Z952 Presence of prosthetic heart valve: Secondary | ICD-10-CM

## 2016-09-16 NOTE — Progress Notes (Signed)
Patient ID: [name], female   DOB: 1950-12-17, 66 y.o.   MRN: [id number]      Cardiology Office Note  Date:  09/16/2016   ID:  [name], [name] 07-Apr-1951, MRN [id number]  PCP:  Garret Reddish, MD  Cardiologist:  Ena Dawley, MD   Chief complain: chest pain  History of Present Illness: [name] is a 66 y.o. female with h/o DM, HLP, OSA on CPAP, who I have followed for chest pain and aortic stenosis. At the last visit in general 2018 her symptoms have significantly worsened and echocardiogram showed preserved LVEF at severe aortic stenosis. She was referred to Dr. Roxy Manns for surgical evaluation. She underwent left and right cardiac catheterization prior to the aortic valve replacement and had minor nonobstructive CAD normal right-sided hemodynamics and severe aortic stenosis. She ultimately underwent partial upper mini sternotomy with aortic valve replacement with bioprosthetic bovine pericardial tissue valve size 25 mm. It was confirmed that her native valve was bicuspid. Her LVEF was normal prior to surgery 55-60% and she had moderate LVH. Today she comes for first postop visit. She denies any fever or chills. Her sternotomy is healing well but it's quite sore. She hasn't started cardiac rehabilitation yet but is about to start at the beginning of April. Denies any dizziness or syncope no chest pain. She feels profoundly tired but doesn't have significant shortness of breath. No lower extremity edema.   Past Medical History:  Diagnosis Date  . Allergy   . Anxiety   . Arthritis    back & knee  . Asthma     mild per pt shows up with resp illness  . Chronic diastolic congestive heart failure ([place])   . Colon polyps   . Diabetes mellitus without complication ([place])   . Gastric polyps   . Gastroparesis   . GERD (gastroesophageal reflux disease)   . Headache    sinus headaches and migraines at times  . Heart murmur   . History of migraine headaches   .  Hyperlipidemia   . IBS (irritable bowel syndrome)   . Lumbar disc disease   . S/P aortic valve replacement with bioprosthetic valve 08/23/2016   25 mm Edwards Intuity rapid-deployment bovine pericardial tissue valve via partial upper mini sternotomy  . Sleep apnea    Past Surgical History:  Procedure Laterality Date  . ABDOMINAL HYSTERECTOMY    . AORTIC VALVE REPLACEMENT N/A 08/23/2016   Procedure: AORTIC VALVE REPLACEMENT (AVR) - using partial Upper Sternotomy- 31m Edwards Intuity Aortic Valve used;  Surgeon: CRexene Alberts MD;  [facility]  Service: Open Heart Surgery;  Laterality: N/A;  . BLADDER SUSPENSION  2007  . BREAST ENHANCEMENT SURGERY  1980  . CARPAL TUNNEL RELEASE Bilateral   . COLONOSCOPY    . DORSAL COMPARTMENT RELEASE Right 09/15/2014   Procedure: RIGHT WRIST DEQUERVAINS RELEASE ;  Surgeon: DKathryne Hitch MD;  [facility]  Service: Orthopedics;  Laterality: Right;  . ESOPHAGOGASTRODUODENOSCOPY    . KNEE ARTHROSCOPY  04/15/2012   Procedure: ARTHROSCOPY KNEE;  Surgeon: DNinetta Lights MD;  [facility]  Service: Orthopedics;  Laterality: Right;  . LAPAROSCOPIC CHOLECYSTECTOMY    . LASIK    . OOPHORECTOMY    . PALATE TO GINGIVA GRAFT  2017  . RIGHT/LEFT HEART CATH AND CORONARY ANGIOGRAPHY N/A 08/02/2016   Procedure: Right/Left Heart Cath and Coronary Angiography;  Surgeon: MSherren Mocha MD;  [facility];  Service: Cardiovascular;  Laterality: N/A;  . ROTATOR CUFF REPAIR Left   . TEE WITHOUT CARDIOVERSION N/A 08/23/2016   Procedure: TRANSESOPHAGEAL ECHOCARDIOGRAM (TEE);  Surgeon: Rexene Alberts, MD;  Location: [facility];  Service: Open Heart Surgery;  Laterality: N/A;  . TOTAL KNEE ARTHROPLASTY  04/15/2012   Procedure: TOTAL KNEE ARTHROPLASTY;  Surgeon: [name], MD;  Location: [facility];  Service: Orthopedics;  Laterality: Right;  . TRIGGER FINGER RELEASE Right 04/20/2015   Procedure: RIGHT TRIGGER FINGER RELEASE (TENDON  SHEALTH INCISION) ;  Surgeon: [name], MD;  Location: [facility];  Service: Orthopedics;  Laterality: Right;   Current Outpatient Prescriptions  Medication Sig Dispense Refill  . acetaminophen (TYLENOL) 500 MG tablet Take 2 tablets (1,000 mg total) by mouth every 6 (six) hours as needed. 30 tablet 0  . aspirin EC 325 MG EC tablet Take 1 tablet (325 mg total) by mouth daily. 30 tablet 0  . atorvastatin (LIPITOR) 40 MG tablet TAKE 1 TABLET BY MOUTH AT NIGHT ON TUESDAY AND FRIDAY 30 tablet 4  . Biotin 5000 MCG TABS Take 5,000 mcg by mouth daily.     . Blood Glucose Monitoring Suppl (ONETOUCH VERIO) W/DEVICE KIT 1 kit by Does not apply route once. 1 kit 0  . calcium-vitamin D (OSCAL WITH D) 500-200 MG-UNIT per tablet Take 2 tablets by mouth daily.     . clindamycin (CLEOCIN) 300 MG capsule Take 300 mg by mouth once. Take 2 cap 1 hour before dental procedures    . dextromethorphan (DELSYM) 30 MG/5ML liquid Take 30 mg by mouth at bedtime.    . fluticasone (FLONASE) 50 MCG/ACT nasal spray Place 2 sprays into both nostrils daily.     . furosemide (LASIX) 40 MG tablet Take 1 tablet (40 mg total) by mouth 2 (two) times daily. For 7 Days, then decrease to 40 mg daily 60 tablet 0  . glucose blood (ONETOUCH VERIO) test strip Use to test blood sugars daily. Dx: E11.9 100 each 12  . ipratropium (ATROVENT) 0.03 % nasal spray Place 2 sprays into both nostrils 2 (two) times daily.   3  . Lancets (ONETOUCH ULTRASOFT) lancets Use to test blood sugars daily. Dx: E11.9 100 each 12  . loratadine (CLARITIN) 10 MG tablet Take 10 mg by mouth at bedtime.     . metFORMIN (GLUCOPHAGE) 500 MG tablet TAKE 1 TABLET WITH BREAKFAST AND 1/2 TO 1 TABLET WITH DINNER. (Patient taking differently: TAKE 1 TABLET (500 MG) WITH BREAKFAST AND DINNER.) 60 tablet 5  . metoCLOPramide (REGLAN) 10 MG tablet TAKE 1/2 TABLET BY MOUTH 4 TIMES A DAY (Patient taking differently: TAKE 1/2 TABLET (5 MG) BY MOUTH 4 TIMES A DAY)  60 tablet 5  . metoprolol tartrate (LOPRESSOR) 25 MG tablet Take 0.5 tablets (12.5 mg total) by mouth 2 (two) times daily. 60 tablet 3  . Multiple Vitamin (MULTIVITAMIN WITH MINERALS) TABS tablet Take 1 tablet by mouth daily.    . nabumetone (RELAFEN) 750 MG tablet TAKE 2 TABLETS BY MOUTH EVERY DAY (Patient taking differently: TAKE 1 TABLET (750 MG) BY MOUTH AT BEDTIME.) 60 tablet 5  . omeprazole (PRILOSEC OTC) 20 MG tablet Take 40 mg by mouth daily with breakfast.    . oxyCODONE (ROXICODONE) 5 MG immediate release tablet Take 1 tablet (5 mg total) by mouth at bedtime as needed. 15 tablet 0  . potassium chloride SA (K-DUR,KLOR-CON) 20 MEQ tablet Take 1 tablet (20 mEq total) by mouth 2 (two) times daily. For  7 days then decrease to 20 mg daily 60 tablet 0  . Probiotic Product (ALIGN) 4 MG CAPS Take 4 mg by mouth daily.     . psyllium (METAMUCIL SMOOTH TEXTURE) 28 % packet Take 1 packet by mouth daily before breakfast.     . SUMAtriptan (IMITREX) 100 MG tablet TAKE 1 TABLET BY MOUTH EVERY 2 HOURS AS NEEDED FOR MIGRAINE (Patient taking differently: Take 100 mg by mouth every 2 (two) hours as needed. TAKE 1 TABLET BY MOUTH EVERY 2 HOURS AS NEEDED FOR MIGRAINE. DO NOT EXCEED 2 DOSES IN 24 HOURS.) 10 tablet 3  . venlafaxine XR (EFFEXOR-XR) 150 MG 24 hr capsule TAKE ONE CAPSULE BY MOUTH EVERY DAY WITH BREAKFAST 30 capsule 5   No current facility-administered medications for this visit.    Allergies:   Erythromycin and Penicillins   Social History:  The patient  reports that she has never smoked. She has never used smokeless tobacco. She reports that she does not drink alcohol or use drugs.   Family History:  The patient's family history includes Alcohol abuse in her father; COPD in her mother; Colon polyps in her maternal aunt and mother; Heart disease in her father; Irritable bowel syndrome in her mother.   ROS:  Please see the history of present illness.   Otherwise, review of systems are positive for  none.   All other systems are reviewed and negative.   PHYSICAL EXAM: VS:  BP 116/62   Pulse 76   Ht 5' 5.5" (1.664 m)   Wt 220 lb (99.8 kg)   BMI 36.05 kg/m  , BMI Body mass index is 36.05 kg/m. GEN: Well nourished, well developed, in no acute distress  HEENT: normal  Neck: no JVD, carotid bruits, or masses Cardiac: RRR; no murmur, no rubs, or gallops,no edema , sternotomy healing well, no discharge or redness Respiratory:  clear to auscultation bilaterally, normal work of breathing GI: soft, nontender, nondistended, + BS MS: no deformity or atrophy  Skin: warm and dry, no rash Neuro:  Strength and sensation are intact Psych: euthymic mood, full affect  EKG:  EKG is ordered today. It shows sinus rhythm with nonspecific ST-T wave abnormalities unchanged from last year.  Echo: 04/2014 Left ventricle: The cavity size was normal. Wall thickness was increased in a pattern of mild LVH. Systolic function was normal. The estimated ejection fraction was in the range of 60% to 65%. Wall motion was normal; there were no regional wall motion abnormalities. Doppler parameters are consistent with abnormal left ventricular relaxation (grade 1 diastolic dysfunction). - Aortic valve: A bicuspid morphology cannot be excluded; mildly thickened, mildly calcified leaflets. There was moderate stenosis. There was mild regurgitation.  Recent Labs: 08/21/2016: ALT 18 08/24/2016: Magnesium 2.0 08/26/2016: BUN 15; Creatinine, Ser 0.61; Hemoglobin 9.0; Platelets 153; Potassium 3.7; Sodium 135   Lipid Panel    Component Value Date/Time   CHOL 148 05/14/2016 0818   TRIG 107.0 05/14/2016 0818   HDL 54.40 05/14/2016 0818   CHOLHDL 3 05/14/2016 0818   VLDL 21.4 05/14/2016 0818   LDLCALC 72 05/14/2016 0818   LDLDIRECT 84.0 11/09/2014 1117   Wt Readings from Last 3 Encounters:  09/16/16 220 lb (99.8 kg)  09/10/16 215 lb (97.5 kg)  08/27/16 233 lb 7.5 oz (105.9 kg)    Lexiscan nuclear  stress test: 12/2014  Nuclear stress EF: 60%.  The study is normal.  This is a low risk study.  The left ventricular ejection fraction is normal (  55-65%). Normal resting and stress perfusion EF 60%.  TTE: 05/2015 - Left ventricle: The cavity size was normal. Wall thickness was   increased in a pattern of mild LVH. Systolic function was normal.   The estimated ejection fraction was in the range of 60% to 65%. - Aortic valve: Valve area (VTI): 0.76 cm^2. Valve area (Vmean):   0.72 cm^2. - Aorta: Moderate to severe AS. mean gradient has increased from 30   to 35 mmHg since 2015. Poorly visualized cannot r/o bicuspid   valve   suggest TEE to further evaluate. - Left atrium: The atrium was mildly dilated. - Atrial septum: No defect or patent foramen ovale was identified.    ASSESSMENT AND PLAN:  1. Severe aortic stenosis secondary to bicuspid walls, status post AVR on 08/23/2016. She is recovering well we will contact cardiac rehabilitation to make sure that she will start at the beginning of April.  2.  Hyperlipidemia - at goal with atorvastatin, no side effects.   3. Acute on chronic diastolic CHF, post surgery, started on Lasix and potassium, we will check BNP and BMP today and discontinue if normal, she appears euvolemic.  4. Fatigue - postsurgery, hemoglobin 9 at the discharge we will repeat CBC today and start iron supplements if needed.  5. Diabetes mellitus - hemoglobin A1c improved to 6.6%.  Follow up in 3 months.  Signed, Ena Dawley, MD  09/16/2016 3:23 PM    [address], [place], [place]  [address] Phone: [phone]; Fax: [phone]

## 2016-09-16 NOTE — Patient Instructions (Signed)
Continue all previous medications without any changes at this time  Continue to avoid any heavy lifting or strenuous use of your arms or shoulders for at least a total of three months from the time of surgery.  After three months you may gradually increase how much you lift or otherwise use your arms or chest as tolerated, with limits based upon whether or not activities lead to the return of significant discomfort.  You may return to driving an automobile as long as you are no longer requiring oral narcotic pain relievers during the daytime.  It would be wise to start driving only short distances during the daylight and gradually increase from there as you feel comfortable.  You are encouraged to enroll and participate in the outpatient cardiac rehab program beginning as soon as practical.  Endocarditis is a potentially serious infection of heart valves or inside lining of the heart.  It occurs more commonly in patients with diseased heart valves (such as patient's with aortic or mitral valve disease) and in patients who have undergone heart valve repair or replacement.  Certain surgical and dental procedures may put you at risk, such as dental cleaning, other dental procedures, or any surgery involving the respiratory, urinary, gastrointestinal tract, gallbladder or prostate gland.   To minimize your chances for develooping endocarditis, maintain good oral health and seek prompt medical attention for any infections involving the mouth, teeth, gums, skin or urinary tract.    Always notify your doctor or dentist about your underlying heart valve condition before having any invasive procedures. You will need to take antibiotics before certain procedures, including all routine dental cleanings or other dental procedures.  Your cardiologist or dentist should prescribe these antibiotics for you to be taken ahead of time.      

## 2016-09-16 NOTE — Progress Notes (Signed)
[facility] [address]       [place] [address]             [phone]     CARDIOTHORACIC SURGERY OFFICE NOTE  Referring Provider is Dorothy Spark, MD PCP is Garret Reddish, MD   HPI:  Patient is a 66 year old obese white female with history of aortic stenosis, atypical chest pain, obstructive sleep apnea, GE reflux disease, and type 2 diabetes mellitus who returns to the office today for routine follow-up status post aortic valve replacement using a rapid deployment bovine pericardial bioprosthetic tissue valve via partial upper mini sternotomy on 08/23/2016 for severe symptomatic aortic stenosis.  The patient's early postoperative recovery was uneventful and she was discharged home on the fourth postoperative day. The patient has done well since hospital discharge. She was seen in our office last week by one of our PAs because one of her chest tube site incisions was open because a suture was never tied at the time the chest tube was removed. The chest tube incision was cleansed and packed with saline moistened gauze. The patient has otherwise done quite well. She was seen in follow-up by Dr. Meda Coffee earlier today and she returns to our office for routine follow-up. She has mild residual soreness in her chest. She is only using pain relievers at night to help her rest. Her breathing is quite good and she has been up and ambulating every day. Appetite is fine. Overall she is pleased with her progress.   Current Outpatient Prescriptions  Medication Sig Dispense Refill  . acetaminophen (TYLENOL) 500 MG tablet Take 2 tablets (1,000 mg total) by mouth every 6 (six) hours as needed. 30 tablet 0  . aspirin EC 325 MG EC tablet Take 1 tablet (325 mg total) by mouth daily. 30 tablet 0  . atorvastatin (LIPITOR) 40 MG tablet TAKE 1 TABLET BY MOUTH AT NIGHT ON TUESDAY AND FRIDAY 30 tablet 4  . Biotin 5000 MCG TABS Take 5,000 mcg by mouth daily.     . Blood Glucose Monitoring Suppl  (ONETOUCH VERIO) W/DEVICE KIT 1 kit by Does not apply route once. 1 kit 0  . calcium-vitamin D (OSCAL WITH D) 500-200 MG-UNIT per tablet Take 2 tablets by mouth daily.     . clindamycin (CLEOCIN) 300 MG capsule Take 300 mg by mouth once. Take 2 cap 1 hour before dental procedures    . dextromethorphan (DELSYM) 30 MG/5ML liquid Take 30 mg by mouth at bedtime.    . fluticasone (FLONASE) 50 MCG/ACT nasal spray Place 2 sprays into both nostrils daily.     . furosemide (LASIX) 40 MG tablet Take 1 tablet (40 mg total) by mouth 2 (two) times daily. For 7 Days, then decrease to 40 mg daily 60 tablet 0  . glucose blood (ONETOUCH VERIO) test strip Use to test blood sugars daily. Dx: E11.9 100 each 12  . ipratropium (ATROVENT) 0.03 % nasal spray Place 2 sprays into both nostrils 2 (two) times daily.   3  . Lancets (ONETOUCH ULTRASOFT) lancets Use to test blood sugars daily. Dx: E11.9 100 each 12  . loratadine (CLARITIN) 10 MG tablet Take 10 mg by mouth at bedtime.     . metFORMIN (GLUCOPHAGE) 500 MG tablet TAKE 1 TABLET WITH BREAKFAST AND 1/2 TO 1 TABLET WITH DINNER. (Patient taking differently: TAKE 1 TABLET (500 MG) WITH BREAKFAST AND DINNER.) 60 tablet 5  . metoCLOPramide (REGLAN) 10 MG tablet TAKE 1/2 TABLET BY  MOUTH 4 TIMES A DAY (Patient taking differently: TAKE 1/2 TABLET (5 MG) BY MOUTH 4 TIMES A DAY) 60 tablet 5  . metoprolol tartrate (LOPRESSOR) 25 MG tablet Take 0.5 tablets (12.5 mg total) by mouth 2 (two) times daily. 60 tablet 3  . Multiple Vitamin (MULTIVITAMIN WITH MINERALS) TABS tablet Take 1 tablet by mouth daily.    . nabumetone (RELAFEN) 750 MG tablet TAKE 2 TABLETS BY MOUTH EVERY DAY (Patient taking differently: TAKE 1 TABLET (750 MG) BY MOUTH AT BEDTIME.) 60 tablet 5  . omeprazole (PRILOSEC OTC) 20 MG tablet Take 40 mg by mouth daily with breakfast.    . oxyCODONE (ROXICODONE) 5 MG immediate release tablet Take 1 tablet (5 mg total) by mouth at bedtime as needed. 15 tablet 0  . potassium  chloride SA (K-DUR,KLOR-CON) 20 MEQ tablet Take 1 tablet (20 mEq total) by mouth 2 (two) times daily. For 7 days then decrease to 20 mg daily 60 tablet 0  . Probiotic Product (ALIGN) 4 MG CAPS Take 4 mg by mouth daily.     . psyllium (METAMUCIL SMOOTH TEXTURE) 28 % packet Take 1 packet by mouth daily before breakfast.     . SUMAtriptan (IMITREX) 100 MG tablet TAKE 1 TABLET BY MOUTH EVERY 2 HOURS AS NEEDED FOR MIGRAINE (Patient taking differently: Take 100 mg by mouth every 2 (two) hours as needed. TAKE 1 TABLET BY MOUTH EVERY 2 HOURS AS NEEDED FOR MIGRAINE. DO NOT EXCEED 2 DOSES IN 24 HOURS.) 10 tablet 3  . venlafaxine XR (EFFEXOR-XR) 150 MG 24 hr capsule TAKE ONE CAPSULE BY MOUTH EVERY DAY WITH BREAKFAST 30 capsule 5   No current facility-administered medications for this visit.       Physical Exam:   BP 116/67 (BP Location: Right Arm, Patient Position: Sitting, Cuff Size: Large)   Pulse 79   Resp 16   Ht 5' 5.5" (1.664 m)   Wt 215 lb (97.5 kg)   SpO2 99% Comment: RA  BMI 35.23 kg/m   General:  Well-appearing  Chest:   Clear to auscultation  CV:   Regular rate and rhythm without murmur  Incisions:  Clean and dry healing nicely, right medial chest tube incision is open but clean  Abdomen:  Soft nontender  Extremities:  Warm and well-perfused  Diagnostic Tests:  CHEST  2 VIEW  COMPARISON:  08/26/2016  FINDINGS: Stable normal heart size. Stable aortic tortuosity. Median sternotomy and aortic valve replacement. Improved aeration and resolved atelectasis. There is no edema, consolidation, effusion, or pneumothorax.  IMPRESSION: No evidence of active disease.  Resolved atelectasis.   Electronically Signed   By: Monte Fantasia M.D.   On: 09/16/2016 16:27   Impression:  Patient is doing very well approximately 3 weeks status post aortic valve replacement using a bioprosthetic tissue valve  Plan:  I have encouraged the patient to continue to gradually increase her  physical activity with her primary limitation remaining that she refrain from heavy lifting or strenuous use of her arms or shoulders for at least another 2 months. We have not recommended any changes to her current medications. I've encouraged her to enroll and participate in outpatient cardiac rehabilitation program. Wound care for management of her chest tube incision has been discussed. All of her questions been addressed. The patient will return to our office in approximately 2 months for routine follow-up and wound check.    Valentina Gu. Roxy Manns, MD 09/16/2016 4:45 PM

## 2016-09-16 NOTE — Patient Instructions (Signed)
Medication Instructions:   Your physician recommends that you continue on your current medications as directed. Please refer to the Current Medication list given to you today.     Labwork:  TODAY--CMET, CBC W DIFF, TSH, AND PRO-BNP     Follow-Up:  3 MONTHS WITH DR Meda Coffee       If you need a refill on your cardiac medications before your next appointment, please call your pharmacy.

## 2016-09-17 LAB — COMPREHENSIVE METABOLIC PANEL
ALT: 11 IU/L (ref 0–32)
AST: 20 IU/L (ref 0–40)
Albumin/Globulin Ratio: 1.6 (ref 1.2–2.2)
Albumin: 4.2 g/dL (ref 3.6–4.8)
Alkaline Phosphatase: 213 IU/L — ABNORMAL HIGH (ref 39–117)
BUN/Creatinine Ratio: 25 (ref 12–28)
BUN: 17 mg/dL (ref 8–27)
Bilirubin Total: 0.3 mg/dL (ref 0.0–1.2)
CO2: 22 mmol/L (ref 18–29)
Calcium: 9.2 mg/dL (ref 8.7–10.3)
Chloride: 94 mmol/L — ABNORMAL LOW (ref 96–106)
Creatinine, Ser: 0.67 mg/dL (ref 0.57–1.00)
GFR calc Af Amer: 107 mL/min/{1.73_m2} (ref >59)
GFR calc non Af Amer: 93 mL/min/{1.73_m2} (ref >59)
Globulin, Total: 2.6 g/dL (ref 1.5–4.5)
Glucose: 111 mg/dL — ABNORMAL HIGH (ref 65–99)
Potassium: 4.7 mmol/L (ref 3.5–5.2)
Sodium: 136 mmol/L (ref 134–144)
Total Protein: 6.8 g/dL (ref 6.0–8.5)

## 2016-09-17 LAB — CBC WITH DIFFERENTIAL/PLATELET
Basophils Absolute: 0 10*3/uL (ref 0.0–0.2)
Basos: 0 %
EOS (ABSOLUTE): 0.1 10*3/uL (ref 0.0–0.4)
Eos: 1 %
Hematocrit: 33.8 % — ABNORMAL LOW (ref 34.0–46.6)
Hemoglobin: 11.1 g/dL (ref 11.1–15.9)
Immature Grans (Abs): 0 10*3/uL (ref 0.0–0.1)
Immature Granulocytes: 0 %
Lymphocytes Absolute: 3.1 10*3/uL (ref 0.7–3.1)
Lymphs: 33 %
MCH: 26.5 pg — ABNORMAL LOW (ref 26.6–33.0)
MCHC: 32.8 g/dL (ref 31.5–35.7)
MCV: 81 fL (ref 79–97)
Monocytes Absolute: 0.9 10*3/uL (ref 0.1–0.9)
Monocytes: 10 %
Neutrophils Absolute: 5.2 10*3/uL (ref 1.4–7.0)
Neutrophils: 56 %
Platelets: 464 10*3/uL — ABNORMAL HIGH (ref 150–379)
RBC: 4.19 x10E6/uL (ref 3.77–5.28)
RDW: 13.6 % (ref 12.3–15.4)
WBC: 9.4 10*3/uL (ref 3.4–10.8)

## 2016-09-17 LAB — PRO B NATRIURETIC PEPTIDE: NT-Pro BNP: 324 pg/mL — ABNORMAL HIGH (ref 0–301)

## 2016-09-17 LAB — TSH: TSH: 3.34 u[IU]/mL (ref 0.450–4.500)

## 2016-09-17 MED ORDER — CLINDAMYCIN HCL 300 MG PO CAPS: ORAL_CAPSULE | ORAL | 0 refills | Status: DC

## 2016-09-19 ENCOUNTER — Telehealth (HOSPITAL_COMMUNITY): Payer: Self-pay | Admitting: Family Medicine

## 2016-09-19 NOTE — Telephone Encounter (Signed)
I called and left message on patient voicemail to call me about scheduling and participating in Cardiac Rehab program. I left my contact information on patient voicemail to return my call.

## 2016-09-22 ENCOUNTER — Other Ambulatory Visit: Payer: Self-pay | Admitting: Family Medicine

## 2016-09-23 ENCOUNTER — Encounter: Payer: Self-pay | Admitting: Family Medicine

## 2016-09-23 ENCOUNTER — Ambulatory Visit (INDEPENDENT_AMBULATORY_CARE_PROVIDER_SITE_OTHER): Payer: Medicare Other | Admitting: Family Medicine

## 2016-09-23 VITALS — BP 102/60 | HR 70 | Temp 98.8°F | Ht 65.5 in | Wt 219.2 lb

## 2016-09-23 DIAGNOSIS — Z0000 Encounter for general adult medical examination without abnormal findings: Secondary | ICD-10-CM | POA: Diagnosis not present

## 2016-09-23 DIAGNOSIS — E119 Type 2 diabetes mellitus without complications: Secondary | ICD-10-CM | POA: Diagnosis not present

## 2016-09-23 NOTE — Progress Notes (Signed)
Pre visit review using our clinic review tool, if applicable. No additional management support is needed unless otherwise documented below in the visit note. 

## 2016-09-23 NOTE — Patient Instructions (Signed)
Ms. Estrin , Thank you for taking time to come for your Medicare Wellness Visit. I appreciate your ongoing commitment to your health goals. Please review the following plan we discussed and let me know if I can assist you in the future.   These are the goals we discussed: 1. Keep trying to get plugged in with cardiac rehab 2. See me in 3 months to check in on diabetes. Can do fingerprick a1c.   This is a list of the screening recommended for you and due dates:  Health Maintenance  Topic Date Due  . HIV Screening  06/20/2098*  . Flu Shot  01/22/2017  . Hemoglobin A1C  02/18/2017  . Eye exam for diabetics  03/28/2017  . Urine Protein Check  05/14/2017  . Complete foot exam   05/21/2017  . Pneumonia vaccines (2 of 2 - PPSV23) 05/21/2017  . Tetanus Vaccine  08/05/2017  . Mammogram  10/10/2017  . Colon Cancer Screening  07/28/2024  . DEXA scan (bone density measurement)  Completed  .  Hepatitis C: One time screening is recommended by Center for Disease Control  (CDC) for  adults born from [birth year] through 1965.   Completed  *Topic was postponed. The date shown is not the original due date.

## 2016-09-23 NOTE — Progress Notes (Signed)
Phone: [phone]  Subjective:  Patient presents today for their annual wellness visit.    Preventive Screening-Counseling & Management  Vision screen: 20/20 with glasses  Advanced directives: has copy but needs to find it. Would want short term resuscitation just would not want prolonged life support if did not think could make meaningful recover  Smoking Status: Never Smoker Second Hand Smoking status: No smokers in home  Risk Factors Regular exercise: pre surgery 3x a week for 60 minutes. Post trying to get into exercise at cardiac rehab but doing home exercises Diet: reasonable- working on weight loss  Fall Risk: None  Fall Risk  09/23/2016 11/14/2015 06/03/2014  Falls in the past year? No No No    Cardiac risk factors:  advanced age (older than 12 for men, 60 for women)  Hyperlipidemia but controlled HTn controlled Diabetes controlled  Depression Screen None. PHQ2 0  Depression screen [facility] 2/9 09/23/2016 11/14/2015 06/03/2014  Decreased Interest 0 0 0  Down, Depressed, Hopeless 0 0 0  PHQ - 2 Score 0 0 0  Some recent data might be hidden    Activities of Daily Living Independent ADLs and IADLs   Hearing Difficulties: -patient endorses hearing loss- high frequency tested in past- does not want hearing aids  Cognitive Testing No reported trouble.    Normal 3 word recall  List the Names of Other Physician/Practitioners you currently use: -Dr. Meda Coffee Cardiology - Dr. Roxy Manns cardiothoracic surgery -Dr. Elvera Lennox dermatology - Dr. Harold Hedge allergist Dr. Marcelina Morel Dr. [name] GI Dr. Corrie Dandy with pulmonary -dr. Franchot Gallo ortho and Dr. Ron Agee also pm&r - Dr. Gershon Crane  Immunization History  Administered Date(s) Administered  . Influenza Split 04/04/2011, 03/05/2012  . Influenza Whole 04/16/2007, 03/16/2009, 03/13/2010  . Influenza,inj,Quad PF,36+ Mos 03/30/2013, 03/10/2014, 03/23/2015, 02/27/2016  . Pneumococcal Conjugate-13 05/21/2016  . Td  06/24/1996, 08/06/2007  . Zoster 05/12/2014   Required Immunizations needed today : no up to date  Screening tests- up to date Mammogram due but holding off due to chest pain Colonoscopy 07/28/14 and 10 year follow up Hysterectomy for benign reasons so does not get pap smear. Does see GYN still  ROS- No pertinent positives discovered in course of AWV ROS- chest pain but slowly improving, no shortness of breath. No edema. No blurry vision. Some headaches since surgery.   The following were reviewed and entered/updated in epic: Past Medical History:  Diagnosis Date  . Allergy   . Anxiety   . Arthritis    back & knee  . Asthma     mild per pt shows up with resp illness  . Chronic diastolic congestive heart failure ([place])   . Colon polyps   . Diabetes mellitus without complication ([place])   . Gastric polyps   . Gastroparesis   . GERD (gastroesophageal reflux disease)   . Headache    sinus headaches and migraines at times  . Heart murmur   . History of migraine headaches   . Hyperlipidemia   . IBS (irritable bowel syndrome)   . Lumbar disc disease   . S/P aortic valve replacement with bioprosthetic valve 08/23/2016   25 mm Edwards Intuity rapid-deployment bovine pericardial tissue valve via partial upper mini sternotomy  . Sleep apnea    Patient Active Problem List   Diagnosis Date Noted  . S/P minimally invasive aortic valve replacement with bioprosthetic valve 08/23/2016    Priority: High  . Chronic diastolic congestive heart failure ([place])     Priority: High  .  Diabetes mellitus type 2, controlled ([place]) 05/12/2014    Priority: High  . Osteopenia 05/27/2016    Priority: Medium  . OSA (obstructive sleep apnea) 02/27/2016    Priority: Medium  . Elevated alkaline phosphatase level 05/16/2015    Priority: Medium  . GERD (gastroesophageal reflux disease) 03/10/2014    Priority: Medium  . Generalized anxiety disorder 03/10/2014    Priority: Medium  . Migraines 03/10/2014     Priority: Medium  . Irritable bowel syndrome 12/15/2008    Priority: Medium  . OBESITY, MORBID 10/12/2008    Priority: Medium  . Hyperlipidemia 02/09/2008    Priority: Medium  . Bruxism 10/23/2015    Priority: Low  . Atypical chest pain 11/10/2014    Priority: Low  . Acne 05/12/2014    Priority: Low  . EUSTACHIAN TUBE DYSFUNCTION, CHRONIC 06/14/2010    Priority: Low  . Low back pain 08/11/2008    Priority: Low  . Allergic rhinitis 02/12/2007    Priority: Low  . ASTHMA 02/12/2007    Priority: Low  . Severe aortic stenosis 07/08/2016   Past Surgical History:  Procedure Laterality Date  . ABDOMINAL HYSTERECTOMY    . AORTIC VALVE REPLACEMENT N/A 08/23/2016   Procedure: AORTIC VALVE REPLACEMENT (AVR) - using partial Upper Sternotomy- 66m Edwards Intuity Aortic Valve used;  Surgeon: CRexene Alberts MD;  [facility]  Service: Open Heart Surgery;  Laterality: N/A;  . BLADDER SUSPENSION  2007  . BREAST ENHANCEMENT SURGERY  1980  . CARPAL TUNNEL RELEASE Bilateral   . COLONOSCOPY    . DORSAL COMPARTMENT RELEASE Right 09/15/2014   Procedure: RIGHT WRIST DEQUERVAINS RELEASE ;  Surgeon: DKathryne Hitch MD;  [facility]  Service: Orthopedics;  Laterality: Right;  . ESOPHAGOGASTRODUODENOSCOPY    . KNEE ARTHROSCOPY  04/15/2012   Procedure: ARTHROSCOPY KNEE;  Surgeon: DNinetta Lights MD;  [facility]  Service: Orthopedics;  Laterality: Right;  . LAPAROSCOPIC CHOLECYSTECTOMY    . LASIK    . OOPHORECTOMY    . PALATE TO GINGIVA GRAFT  2017  . RIGHT/LEFT HEART CATH AND CORONARY ANGIOGRAPHY N/A 08/02/2016   Procedure: Right/Left Heart Cath and Coronary Angiography;  Surgeon: MSherren Mocha MD;  [facility];  Service: Cardiovascular;  Laterality: N/A;  . ROTATOR CUFF REPAIR Left   . TEE WITHOUT CARDIOVERSION N/A 08/23/2016   Procedure: TRANSESOPHAGEAL ECHOCARDIOGRAM (TEE);  Surgeon: CRexene Alberts MD;  [facility]  Service: Open Heart  Surgery;  Laterality: N/A;  . TOTAL KNEE ARTHROPLASTY  04/15/2012   Procedure: TOTAL KNEE ARTHROPLASTY;  Surgeon: DNinetta Lights MD;  [facility]  Service: Orthopedics;  Laterality: Right;  . TRIGGER FINGER RELEASE Right 04/20/2015   Procedure: RIGHT TRIGGER FINGER RELEASE (TENDON SHEALTH INCISION) ;  Surgeon: DNinetta Lights MD;  [facility]  Service: Orthopedics;  Laterality: Right;    Family History  Problem Relation Age of Onset  . COPD Mother   . Colon polyps Mother   . Irritable bowel syndrome Mother   . Heart disease Father   . Alcohol abuse Father   . Colon polyps Maternal Aunt     Medications- reviewed and updated Current Outpatient Prescriptions  Medication Sig Dispense Refill  . acetaminophen (TYLENOL) 500 MG tablet Take 2 tablets (1,000 mg total) by mouth every 6 (six) hours as needed. 30 tablet 0  . aspirin EC 325 MG EC tablet Take 1 tablet (325 mg total) by mouth daily.  30 tablet 0  . atorvastatin (LIPITOR) 40 MG tablet TAKE 1 TABLET BY MOUTH AT NIGHT ON TUESDAY AND FRIDAY 30 tablet 4  . Biotin 5000 MCG TABS Take 5,000 mcg by mouth daily.     . Blood Glucose Monitoring Suppl (ONETOUCH VERIO) W/DEVICE KIT 1 kit by Does not apply route once. 1 kit 0  . calcium-vitamin D (OSCAL WITH D) 500-200 MG-UNIT per tablet Take 2 tablets by mouth daily.     . clindamycin (CLEOCIN) 300 MG capsule Take 2 capsules (600 mg total) by mouth 2 hours prior too dental cleaning. 2 capsule 0  . fluticasone (FLONASE) 50 MCG/ACT nasal spray Place 2 sprays into both nostrils daily.     . furosemide (LASIX) 40 MG tablet Take 1 tablet (40 mg total) by mouth 2 (two) times daily. For 7 Days, then decrease to 40 mg daily 60 tablet 0  . glucose blood (ONETOUCH VERIO) test strip Use to test blood sugars daily. Dx: E11.9 100 each 12  . ipratropium (ATROVENT) 0.03 % nasal spray Place 2 sprays into both nostrils 2 (two) times daily.   3  . Lancets (ONETOUCH ULTRASOFT) lancets  Use to test blood sugars daily. Dx: E11.9 100 each 12  . loratadine (CLARITIN) 10 MG tablet Take 10 mg by mouth at bedtime.     . metFORMIN (GLUCOPHAGE) 500 MG tablet TAKE 1 TABLET WITH BREAKFAST AND 1/2 TO 1 TABLET WITH DINNER. (Patient taking differently: TAKE 1 TABLET (500 MG) WITH BREAKFAST AND DINNER.) 60 tablet 5  . metoCLOPramide (REGLAN) 10 MG tablet TAKE 1/2 TABLET BY MOUTH 4 TIMES A DAY (Patient taking differently: TAKE 1/2 TABLET (5 MG) BY MOUTH 4 TIMES A DAY) 60 tablet 5  . metoprolol tartrate (LOPRESSOR) 25 MG tablet Take 0.5 tablets (12.5 mg total) by mouth 2 (two) times daily. 60 tablet 3  . Multiple Vitamin (MULTIVITAMIN WITH MINERALS) TABS tablet Take 1 tablet by mouth daily.    . nabumetone (RELAFEN) 750 MG tablet TAKE 2 TABLETS BY MOUTH EVERY DAY 60 tablet 5  . omeprazole (PRILOSEC OTC) 20 MG tablet Take 40 mg by mouth daily with breakfast.    . oxyCODONE (ROXICODONE) 5 MG immediate release tablet Take 1 tablet (5 mg total) by mouth at bedtime as needed. 15 tablet 0  . potassium chloride SA (K-DUR,KLOR-CON) 20 MEQ tablet Take 1 tablet (20 mEq total) by mouth 2 (two) times daily. For 7 days then decrease to 20 mg daily 60 tablet 0  . Probiotic Product (ALIGN) 4 MG CAPS Take 4 mg by mouth daily.     . psyllium (METAMUCIL SMOOTH TEXTURE) 28 % packet Take 1 packet by mouth daily before breakfast.     . SUMAtriptan (IMITREX) 100 MG tablet TAKE 1 TABLET BY MOUTH EVERY 2 HOURS AS NEEDED FOR MIGRAINE (Patient taking differently: Take 100 mg by mouth every 2 (two) hours as needed. TAKE 1 TABLET BY MOUTH EVERY 2 HOURS AS NEEDED FOR MIGRAINE. DO NOT EXCEED 2 DOSES IN 24 HOURS.) 10 tablet 3  . venlafaxine XR (EFFEXOR-XR) 150 MG 24 hr capsule TAKE ONE CAPSULE BY MOUTH EVERY DAY WITH BREAKFAST 30 capsule 5   No current facility-administered medications for this visit.     Allergies-reviewed and updated Allergies  Allergen Reactions  . Erythromycin Nausea Only  . Penicillins Itching and  Rash    Has patient had a PCN reaction causing immediate rash, facial/tongue/throat swelling, SOB or lightheadedness with hypotension: No Has patient had a PCN reaction  causing severe rash involving mucus membranes or skin necrosis: No Has patient had a PCN reaction that required hospitalization: No Has patient had a PCN reaction occurring within the last 10 years: No  If all of the above answers are "NO", then may proceed with Cephalosporin use.     Social History   Social History  . Marital status: Married    Spouse name: N/A  . Number of children: 1  . Years of education: N/A   Occupational History  . Retired Retired   Social History Main Topics  . Smoking status: Never Smoker  . Smokeless tobacco: Never Used  . Alcohol use No  . Drug use: No  . Sexual activity: Yes   Other Topics Concern  . None   Social History Narrative   She lives with husband (1978) and two dogs. Step-son [name] (1971-psychiatrist in [place]). No grandkids. 2 dogs-sheltie/collie mix and border collie mix      Highest level of education:  Master in education   She is retired Animal nutritionist x 32 years.      Hobbies: time with dogs             Objective: BP 102/60 (BP Location: Left Arm, Patient Position: Sitting, Cuff Size: Large)   Pulse 70   Temp 98.8 F (37.1 C) (Oral)   Ht 5' 5.5" (1.664 m)   Wt 219 lb 3.2 oz (99.4 kg)   SpO2 97%   BMI 35.92 kg/m  Gen: NAD, resting comfortably HEENT: Mucous membranes are moist. Oropharynx normal Neck: no thyromegaly CV: RRR no murmurs rubs or gallops Lungs: CTAB no crackles, wheeze, rhonchi Abdomen: soft/nontender/nondistended/normal bowel sounds. No rebound or guarding.  Ext: no edema Skin: warm, dry Neuro: grossly normal, moves all extremities, PERRLA  Assessment/Plan:  AWV completed- discussed recommended screenings anddocumented any personalized health advice and referrals for preventive counseling. See AVS as well which was given  to patient.   Status of chronic or acute concerns  Diabetes- well controlled metformin 575m BID Lab Results  Component Value Date   HGBA1C 6.6 (H) 08/21/2016   IBS- stable on [place] and metamucil  GAD- venlafaxine controlling  GERD- omeprazole with history of strictures  OSA- pending pulmonology visit  Morbid obesity- working on weight loss, limited activity after surgery  Low back pain- has been doing reasonably well through surgery  3 months for a1c check  Return precautions advised. SGarret Reddish MD

## 2016-09-26 ENCOUNTER — Encounter: Payer: Self-pay | Admitting: Family Medicine

## 2016-09-26 ENCOUNTER — Encounter: Payer: Self-pay | Admitting: Pulmonary Disease

## 2016-09-26 ENCOUNTER — Ambulatory Visit (INDEPENDENT_AMBULATORY_CARE_PROVIDER_SITE_OTHER): Payer: Medicare Other | Admitting: Pulmonary Disease

## 2016-09-26 VITALS — BP 114/64 | HR 78 | Ht 65.5 in | Wt 214.8 lb

## 2016-09-26 DIAGNOSIS — F458 Other somatoform disorders: Secondary | ICD-10-CM

## 2016-09-26 DIAGNOSIS — J301 Allergic rhinitis due to pollen: Secondary | ICD-10-CM | POA: Diagnosis not present

## 2016-09-26 DIAGNOSIS — G4733 Obstructive sleep apnea (adult) (pediatric): Secondary | ICD-10-CM | POA: Diagnosis not present

## 2016-09-26 NOTE — Patient Instructions (Signed)
  It was a pleasure taking care of you today!  Continue using your CPAP machine.  We will schedule you to have a mask fit session with your  DME company. We will get a download from your machine in May. We will call you back with results.  Please make sure you use your CPAP device everytime you sleep.  We will monitor the usage of your machine per your insurance requirement.  Your insurance company may take the machine from you if you are not using it regularly.   Please clean the mask, tubings, filter, water reservoir with soapy water every week.  Please use distilled water for the water reservoir.   Please call the office or your machine provider (DME company) if you are having issues with the device.   Return to clinic in 1 year with NP/APP

## 2016-09-26 NOTE — Assessment & Plan Note (Signed)
Sees Allergist. On Claritin D 24, Azelastine and nasonex and saline.

## 2016-09-26 NOTE — Progress Notes (Signed)
Subjective:    Patient ID: [name], female    DOB: 08/09/1950, 66 y.o.   MRN: [id number]  HPI  Pt is  [name], 66 y.o. Female, who was referred by Dr. Garret Reddish in consultation regarding possible OSA.   DATA HST (12/28/15)  AHI 19. autocpap 5-10 cm water   ROV 02/27/16 Patient returns to the office as follow-up on her sleep apnea. Since last seen, she had a home sleep study on July 6, which showed AHI of 19. She started an auto CPAP. Feels better using it. More energy. Less sleepiness. Download the last month, 100% compliance, AHI 3.4, auto CPAP 5-10 cm water.  ROV 09/26/2016 Patient returns to the office as follow-up on her sleep apnea. Download the last month: 43%, AHI 5.2.  Since last seen, she had aortic valve replacement at [facility]  start of March. She tolerated procedure well. She has been home a month now. Since having the valve surgery, she has issues falling asleep on her side. She is forced to sleep on her back because she has more pain in her side since having the surgery. With that position change, she is having a lot of fatigue issues with her mask. Leak is bothering her.  Review of Systems  Constitutional: Negative.  Negative for fever and unexpected weight change.  HENT: Positive for congestion and postnasal drip. Negative for dental problem, ear pain, nosebleeds, rhinorrhea, sneezing, sore throat and trouble swallowing.   Eyes: Positive for redness and itching.  Respiratory: Positive for cough. Negative for chest tightness, shortness of breath and wheezing.   Cardiovascular: Negative.  Negative for palpitations and leg swelling.  Gastrointestinal: Negative.  Negative for nausea and vomiting.  Endocrine: Negative.   Genitourinary: Negative.  Negative for dysuria.  Musculoskeletal: Positive for arthralgias and back pain. Negative for joint swelling.  Skin: Negative.  Negative for rash.  Allergic/Immunologic: Positive for environmental allergies.    Neurological: Positive for headaches.  Hematological: Negative.  Does not bruise/bleed easily.  Psychiatric/Behavioral: Negative.  Negative for dysphoric mood. The patient is not nervous/anxious.       Objective:   Physical Exam   Vitals:  Vitals:   09/26/16 1134  BP: 114/64  Pulse: 78  SpO2: 98%  Weight: 214 lb 12.8 oz (97.4 kg)  Height: 5' 5.5" (1.664 m)    Constitutional/General:  Pleasant, well-nourished, well-developed, not in any distress,  Comfortably seating.  Well kempt  Body mass index is 35.2 kg/m. Wt Readings from Last 3 Encounters:  09/26/16 214 lb 12.8 oz (97.4 kg)  09/23/16 219 lb 3.2 oz (99.4 kg)  09/16/16 215 lb (97.5 kg)     HEENT: Pupils equal and reactive to light and accommodation. Anicteric sclerae. Normal nasal mucosa.   No oral  lesions,  mouth clear,  oropharynx clear, no postnasal drip. (-) Oral thrush. No dental caries.  Airway - Mallampati class III  Neck: No masses. Midline trachea. No JVD, (-) LAD. (-) bruits appreciated.  Respiratory/Chest: Grossly normal chest. (-) deformity. (-) Accessory muscle use.  Symmetric expansion. (-) Tenderness on palpation.  Resonant on percussion.  Diminished BS on both lower lung zones. (-) wheezing, crackles, rhonchi (-) egophony  Cardiovascular: Regular rate and  rhythm, heart sounds normal, no murmur or gallops, no peripheral edema  Gastrointestinal:  Normal bowel sounds. Soft, non-tender. No hepatosplenomegaly.  (-) masses.   Musculoskeletal:  Normal muscle tone. Normal gait.   Extremities: Grossly normal. (-) clubbing, cyanosis.  (-) edema  Skin: (-)  rash,lesions seen.   Neurological/Psychiatric : alert, oriented to time, place, person. Normal mood and affect           Assessment & Plan:  OSA (obstructive sleep apnea) Pt has snoring. Denies gasping, choking. Denies witnessed apneas.  (+) sleep talking. Has legs moving with occasional dreaming. (-) other abn behavior in sleep.   Noted to have bruxism by dentist.   Has unrefreshed sleep.  Mild hypersomnia.   HST (12/28/15)  AHI 19. autocpap 5-10 cm water.   Feels better using auto CPAP. More energy. Less sleepiness. Feels benefit of CPAP use. Since having. Valve surgery months ago, she has had issues sleeping on her side. She ends up sleeping in her back which makes it uncomfortable for her and for her to have leak issues. Download the last month was low she was admitted to the hospital and her AHI was 5.2, higher than previous.  Plan : We extensively discussed the importance of treating OSA and the need to use PAP therapy.   Continue with autocpap 5-10 cm water.  She has a full face mask. We will ask her DME company to do a mask fit on her and see whether she'll do better on nasal pillows or nasal mask. If they will not be happy with her DME company, we will ask her to see Lynnae Sandhoff for a PAP NAP or mask fitting session. Plan for download in May for 2 weeks to make sure her AHI is improving with the change in mask.    Patient was instructed to have mask, tubings, filter, reservoir cleaned at least once a week with soapy water.  Patient was instructed to call the office if he/she is having issues with the PAP device.    I advised patient to obtain sufficient amount of sleep --  7 to 8 hours at least in a 24 hr period.  Patient was advised to follow good sleep hygiene.  Patient was advised NOT to engage in activities requiring concentration and/or vigilance if he/she is and  sleepy.  Patient is NOT to drive if he/she is sleepy.    Allergic rhinitis Sees Allergist. On Claritin D 24, Azelastine and nasonex and saline.    Bruxism Better with cpap.      Return to clinic in 6 mos.      Monica Becton, MD 09/26/2016   4:12 PM Pulmonary and [place] Pager: [phone] Office: [phone], Fax: [phone]

## 2016-09-26 NOTE — Assessment & Plan Note (Signed)
Pt has snoring. Denies gasping, choking. Denies witnessed apneas.  (+) sleep talking. Has legs moving with occasional dreaming. (-) other abn behavior in sleep.  Noted to have bruxism by dentist.   Has unrefreshed sleep.  Mild hypersomnia.   HST (12/28/15)  AHI 19. autocpap 5-10 cm water.   Feels better using auto CPAP. More energy. Less sleepiness. Feels benefit of CPAP use. Since having. Valve surgery months ago, she has had issues sleeping on her side. She ends up sleeping in her back which makes it uncomfortable for her and for her to have leak issues. Download the last month was low she was admitted to the hospital and her AHI was 5.2, higher than previous.  Plan : We extensively discussed the importance of treating OSA and the need to use PAP therapy.   Continue with autocpap 5-10 cm water.  She has a full face mask. We will ask her DME company to do a mask fit on her and see whether she'll do better on nasal pillows or nasal mask. If they will not be happy with her DME company, we will ask her to see Lynnae Sandhoff for a PAP NAP or mask fitting session. Plan for download in May for 2 weeks to make sure her AHI is improving with the change in mask.    Patient was instructed to have mask, tubings, filter, reservoir cleaned at least once a week with soapy water.  Patient was instructed to call the office if he/she is having issues with the PAP device.    I advised patient to obtain sufficient amount of sleep --  7 to 8 hours at least in a 24 hr period.  Patient was advised to follow good sleep hygiene.  Patient was advised NOT to engage in activities requiring concentration and/or vigilance if he/she is and  sleepy.  Patient is NOT to drive if he/she is sleepy.

## 2016-09-26 NOTE — Telephone Encounter (Signed)
Thanks for being my doctor! Been a pleasure working with you! All the best to you & your family in [place]!

## 2016-09-26 NOTE — Assessment & Plan Note (Signed)
Better with cpap

## 2016-10-03 ENCOUNTER — Encounter: Payer: Self-pay | Admitting: Thoracic Surgery (Cardiothoracic Vascular Surgery)

## 2016-10-03 ENCOUNTER — Encounter: Payer: Self-pay | Admitting: Pulmonary Disease

## 2016-10-08 ENCOUNTER — Encounter: Payer: Self-pay | Admitting: Pulmonary Disease

## 2016-10-12 ENCOUNTER — Telehealth: Payer: Self-pay | Admitting: Pulmonary Disease

## 2016-10-12 NOTE — Telephone Encounter (Signed)
   DL last month : 73%, AHI 5.5 On autocpap 5-10 cm water.  Her leak is improved the last week.  Jasmine : pls tell her pt the DL was good. Her leak seems improved the last week.  Can you ask her if she was able to get a mask fit from her DME?  Or do we need to schedule her for a mask fitting session with vernon ?  Thanks!  pls keep me posted.   Monica Becton, MD 10/12/2016, 3:13 AM [place] [facility] Pager [phone] After 3 pm or if no answer, call [phone]

## 2016-10-14 NOTE — Telephone Encounter (Signed)
Spoke with patient and informed her of DL information. When asked about the mask fitting; pt stated she had one already. She stated she emailed [facility] regarding it. After review chart, this message was located from patient email on 10/03/16.  I was just fitted for Dream Wear mask.  Think I'll like it better! Thanks for suggesting it to me. Mardene Celeste at [facility] was sad to hear you are leaving.  Have a great weekend!    [facility] please advise on how you like to proceed. Thank you!

## 2016-10-15 ENCOUNTER — Encounter: Payer: Self-pay | Admitting: Cardiology

## 2016-10-15 ENCOUNTER — Other Ambulatory Visit: Payer: Self-pay | Admitting: Physician Assistant

## 2016-10-15 NOTE — Telephone Encounter (Signed)
Pls ask pt if she wants a bigger mask or if she is all set with her current mask.   Thanks!   J. Shirl Harris, MD 10/15/2016, 6:57 AM [place] [facility] Pager [phone] After 3 pm or if no answer, call [phone]

## 2016-10-15 NOTE — Telephone Encounter (Signed)
Spoke with pt, who states she is doing well with her current mask. No bigger mask is needed at this time.  Will route to [facility] as FYI. Thanks.

## 2016-10-16 ENCOUNTER — Other Ambulatory Visit: Payer: Self-pay | Admitting: Cardiology

## 2016-10-16 MED ORDER — POTASSIUM CHLORIDE CRYS ER 20 MEQ PO TBCR: 20.0000 meq | EXTENDED_RELEASE_TABLET | Freq: Every day | ORAL | 11 refills | Status: DC

## 2016-10-16 MED ORDER — METOPROLOL TARTRATE 25 MG PO TABS: 12.5000 mg | ORAL_TABLET | Freq: Two times a day (BID) | ORAL | 11 refills | Status: DC

## 2016-10-16 MED ORDER — FUROSEMIDE 40 MG PO TABS: 40.0000 mg | ORAL_TABLET | Freq: Every day | ORAL | 11 refills | Status: DC

## 2016-10-16 NOTE — Telephone Encounter (Signed)
Pt's medication request has been sent to pt's pharmacy as requested. Confirmation received.

## 2016-10-21 ENCOUNTER — Telehealth (HOSPITAL_COMMUNITY): Payer: Self-pay | Admitting: Family Medicine

## 2016-10-21 NOTE — Telephone Encounter (Signed)
*  Updated insurance benefits* [employer] medicare $20.00 co-payment, no deductible, out of pocket $4,000/$1360.89 has been met, no co-insurance, no pre-authorization and no limit on visit. Passport/reference [id number].

## 2016-10-22 ENCOUNTER — Encounter (HOSPITAL_COMMUNITY): Payer: Self-pay

## 2016-10-22 ENCOUNTER — Encounter (HOSPITAL_COMMUNITY)
Admission: RE | Admit: 2016-10-22 | Discharge: 2016-10-22 | Disposition: A | Discharge status: Home / Self Care | Payer: Medicare Other | Source: Ambulatory Visit | Attending: Cardiology | Admitting: Cardiology

## 2016-10-22 ENCOUNTER — Encounter: Payer: Self-pay | Admitting: Family Medicine

## 2016-10-22 VITALS — BP 103/45 | HR 65 | Ht 65.5 in | Wt 224.2 lb

## 2016-10-22 DIAGNOSIS — Z7982 Long term (current) use of aspirin: Secondary | ICD-10-CM | POA: Insufficient documentation

## 2016-10-22 DIAGNOSIS — Z7984 Long term (current) use of oral hypoglycemic drugs: Secondary | ICD-10-CM | POA: Insufficient documentation

## 2016-10-22 DIAGNOSIS — E785 Hyperlipidemia, unspecified: Secondary | ICD-10-CM | POA: Insufficient documentation

## 2016-10-22 DIAGNOSIS — Z952 Presence of prosthetic heart valve: Secondary | ICD-10-CM | POA: Diagnosis present

## 2016-10-22 DIAGNOSIS — Z79899 Other long term (current) drug therapy: Secondary | ICD-10-CM | POA: Diagnosis not present

## 2016-10-22 DIAGNOSIS — Z953 Presence of xenogenic heart valve: Secondary | ICD-10-CM | POA: Insufficient documentation

## 2016-10-22 DIAGNOSIS — E119 Type 2 diabetes mellitus without complications: Secondary | ICD-10-CM | POA: Insufficient documentation

## 2016-10-22 NOTE — Progress Notes (Signed)
Cardiac Individual Treatment Plan  Patient Details  Name: [name] MRN: [id number] Date of Birth: March 28, 1951 Referring Provider:     CARDIAC REHAB PHASE II ORIENTATION from 10/22/2016 in [facility]  Referring Provider  Ottie Glazier, MD.      Initial Encounter Date:    CARDIAC REHAB PHASE II ORIENTATION from 10/22/2016 in [facility]  Date  10/22/16  Referring Provider  Ottie Glazier, MD.      Visit Diagnosis: 08/23/16 S/P AVR (aortic valve replacement)  Patient's Home Medications on Admission:  Current Outpatient Prescriptions:  .  acetaminophen (TYLENOL) 500 MG tablet, Take 2 tablets (1,000 mg total) by mouth every 6 (six) hours as needed., Disp: 30 tablet, Rfl: 0 .  aspirin EC 325 MG EC tablet, Take 1 tablet (325 mg total) by mouth daily., Disp: 30 tablet, Rfl: 0 .  atorvastatin (LIPITOR) 40 MG tablet, TAKE 1 TABLET BY MOUTH AT NIGHT ON TUESDAY AND FRIDAY, Disp: 30 tablet, Rfl: 4 .  Biotin 5000 MCG TABS, Take 5,000 mcg by mouth daily. , Disp: , Rfl:  .  Blood Glucose Monitoring Suppl (ONETOUCH VERIO) W/DEVICE KIT, 1 kit by Does not apply route once., Disp: 1 kit, Rfl: 0 .  calcium-vitamin D (OSCAL WITH D) 500-200 MG-UNIT per tablet, Take 2 tablets by mouth daily. , Disp: , Rfl:  .  clindamycin (CLEOCIN) 300 MG capsule, Take 2 capsules (600 mg total) by mouth 2 hours prior too dental cleaning., Disp: 2 capsule, Rfl: 0 .  fluticasone (FLONASE) 50 MCG/ACT nasal spray, Place 2 sprays into both nostrils daily. , Disp: , Rfl:  .  furosemide (LASIX) 40 MG tablet, Take 1 tablet (40 mg total) by mouth daily., Disp: 60 tablet, Rfl: 11 .  glucose blood (ONETOUCH VERIO) test strip, Use to test blood sugars daily. Dx: E11.9, Disp: 100 each, Rfl: 12 .  ipratropium (ATROVENT) 0.03 % nasal spray, Place 2 sprays into both nostrils 2 (two) times daily. , Disp: , Rfl: 3 .  Lancets (ONETOUCH ULTRASOFT) lancets, Use to test blood  sugars daily. Dx: E11.9, Disp: 100 each, Rfl: 12 .  loratadine (CLARITIN) 10 MG tablet, Take 10 mg by mouth at bedtime. , Disp: , Rfl:  .  metFORMIN (GLUCOPHAGE) 500 MG tablet, TAKE 1 TABLET WITH BREAKFAST AND 1/2 TO 1 TABLET WITH DINNER. (Patient taking differently: TAKE 1 TABLET (500 MG) WITH BREAKFAST AND DINNER.), Disp: 60 tablet, Rfl: 5 .  metoCLOPramide (REGLAN) 10 MG tablet, TAKE 1/2 TABLET BY MOUTH 4 TIMES A DAY (Patient taking differently: TAKE 1/2 TABLET (5 MG) BY MOUTH 4 TIMES A DAY), Disp: 60 tablet, Rfl: 5 .  metoprolol tartrate (LOPRESSOR) 25 MG tablet, Take 0.5 tablets (12.5 mg total) by mouth 2 (two) times daily., Disp: 60 tablet, Rfl: 11 .  Multiple Vitamin (MULTIVITAMIN WITH MINERALS) TABS tablet, Take 1 tablet by mouth daily., Disp: , Rfl:  .  nabumetone (RELAFEN) 750 MG tablet, TAKE 2 TABLETS BY MOUTH EVERY DAY, Disp: 60 tablet, Rfl: 5 .  omeprazole (PRILOSEC OTC) 20 MG tablet, Take 40 mg by mouth daily with breakfast., Disp: , Rfl:  .  potassium chloride SA (K-DUR,KLOR-CON) 20 MEQ tablet, Take 1 tablet (20 mEq total) by mouth daily., Disp: 60 tablet, Rfl: 11 .  Probiotic Product (ALIGN) 4 MG CAPS, Take 4 mg by mouth daily. , Disp: , Rfl:  .  psyllium (METAMUCIL SMOOTH TEXTURE) 28 % packet, Take 1 packet by mouth daily before  breakfast. , Disp: , Rfl:  .  SUMAtriptan (IMITREX) 100 MG tablet, TAKE 1 TABLET BY MOUTH EVERY 2 HOURS AS NEEDED FOR MIGRAINE (Patient taking differently: Take 100 mg by mouth every 2 (two) hours as needed. TAKE 1 TABLET BY MOUTH EVERY 2 HOURS AS NEEDED FOR MIGRAINE. DO NOT EXCEED 2 DOSES IN 24 HOURS.), Disp: 10 tablet, Rfl: 3 .  venlafaxine XR (EFFEXOR-XR) 150 MG 24 hr capsule, TAKE ONE CAPSULE BY MOUTH EVERY DAY WITH BREAKFAST, Disp: 30 capsule, Rfl: 5 .  oxyCODONE (ROXICODONE) 5 MG immediate release tablet, Take 1 tablet (5 mg total) by mouth at bedtime as needed. (Patient not taking: Reported on 09/26/2016), Disp: 15 tablet, Rfl: 0  Past Medical  History: Past Medical History:  Diagnosis Date  . Allergy   . Anxiety   . Arthritis    back & knee  . Asthma     mild per pt shows up with resp illness  . Chronic diastolic congestive heart failure ([place])   . Colon polyps   . Diabetes mellitus without complication ([place])   . Gastric polyps   . Gastroparesis   . GERD (gastroesophageal reflux disease)   . Headache    sinus headaches and migraines at times  . Heart murmur   . History of migraine headaches   . Hyperlipidemia   . IBS (irritable bowel syndrome)   . Lumbar disc disease   . S/P aortic valve replacement with bioprosthetic valve 08/23/2016   25 mm Edwards Intuity rapid-deployment bovine pericardial tissue valve via partial upper mini sternotomy  . Sleep apnea     Tobacco Use: History  Smoking Status  . Never Smoker  Smokeless Tobacco  . Never Used    Labs: Recent Review Flowsheet Data    Labs for ITP Cardiac and Pulmonary Rehab Latest Ref Rng & Units 08/23/2016 08/23/2016 08/23/2016 08/23/2016 08/24/2016   Cholestrol 0 - 200 mg/dL - - - - -   LDLCALC 0 - 99 mg/dL - - - - -   LDLDIRECT mg/dL - - - - -   HDL >39.00 mg/dL - - - - -   Trlycerides 0.0 - 149.0 mg/dL - - - - -   Hemoglobin A1c 4.8 - 5.6 % - - - - -   PHART 7.350 - 7.450 7.317(L) 7.362 7.283(L) - -   PCO2ART 32.0 - 48.0 mmHg 47.2 44.8 47.1 - -   HCO3 20.0 - 28.0 mmol/L 24.1 25.4 22.3 - -   TCO2 0 - 100 mmol/L _0 ACIDBASEDEF 0.0 - 2.0 mmol/L 2.0 - 4.0(H) - -   O2SAT % 96.0 96.0 90.0 - -      Capillary Blood Glucose: Lab Results  Component Value Date   GLUCAP 114 (H) 08/27/2016   GLUCAP 115 (H) 08/27/2016   GLUCAP 131 (H) 08/27/2016   GLUCAP 109 (H) 08/26/2016   GLUCAP 130 (H) 08/26/2016     Exercise Target Goals: Date: 10/22/16  Exercise Program Goal: Individual exercise prescription set with THRR, safety & activity barriers. Participant demonstrates ability to understand and report RPE using BORG scale, to self-measure pulse  accurately, and to acknowledge the importance of the exercise prescription.  Exercise Prescription Goal: Starting with aerobic activity 30 plus minutes a day, 3 days per week for initial exercise prescription. Provide home exercise prescription and guidelines that participant acknowledges understanding prior to discharge.  Activity Barriers & Risk Stratification:     Activity Barriers & Cardiac Risk Stratification -  10/22/16 1400      Activity Barriers & Cardiac Risk Stratification   Activity Barriers Right Knee Replacement;Arthritis;Other (comment)   Comments DJD, low, rt sided back pain.   Cardiac Risk Stratification Moderate      6 Minute Walk:     6 Minute Walk    Row Name 10/22/16 1518         6 Minute Walk   Phase Initial     Distance 1374 feet     Walk Time 6 minutes     # of Rest Breaks 0     MPH 2.6     METS 2.97     RPE 11     VO2 Peak 10.4     Symptoms No     Resting HR 65 bpm     Resting BP 103/45     Max Ex. HR 112 bpm     Max Ex. BP 142/78     2 Minute Post BP 138/80        Oxygen Initial Assessment:   Oxygen Re-Evaluation:   Oxygen Discharge (Final Oxygen Re-Evaluation):   Initial Exercise Prescription:     Initial Exercise Prescription - 10/22/16 1400      Date of Initial Exercise RX and Referring Provider   Date 10/22/16   Referring Provider Ottie Glazier, MD.     Recumbant Bike   Level 3   Watts 30   Minutes 10   METs 2.94     NuStep   Level 3   SPM 85   Minutes 10   METs 2.5     Track   Laps 11   Minutes 10   METs 2.92     Prescription Details   Frequency (times per week) 3   Duration Progress to 30 minutes of continuous aerobic without signs/symptoms of physical distress     Intensity   THRR 40-80% of Max Heartrate 62-124   Ratings of Perceived Exertion 11-13   Perceived Dyspnea 0-4     Progression   Progression Continue to progress workloads to maintain intensity without signs/symptoms of physical distress.      Resistance Training   Training Prescription Yes   Weight 3lbs.   Reps 10-15      Perform Capillary Blood Glucose checks as needed.  Exercise Prescription Changes:   Exercise Comments:   Exercise Goals and Review:     Exercise Goals    Row Name 10/22/16 1403             Exercise Goals   Increase Physical Activity Yes       Intervention Provide advice, education, support and counseling about physical activity/exercise needs.;Develop an individualized exercise prescription for aerobic and resistive training based on initial evaluation findings, risk stratification, comorbidities and participant's personal goals.       Expected Outcomes Achievement of increased cardiorespiratory fitness and enhanced flexibility, muscular endurance and strength shown through measurements of functional capacity and personal statement of participant.       Increase Strength and Stamina Yes       Intervention Provide advice, education, support and counseling about physical activity/exercise needs.;Develop an individualized exercise prescription for aerobic and resistive training based on initial evaluation findings, risk stratification, comorbidities and participant's personal goals.       Expected Outcomes Achievement of increased cardiorespiratory fitness and enhanced flexibility, muscular endurance and strength shown through measurements of functional capacity and personal statement of participant.          Exercise  Goals Re-Evaluation :    Discharge Exercise Prescription (Final Exercise Prescription Changes):   Nutrition:  Target Goals: Understanding of nutrition guidelines, daily intake of sodium <1561m, cholesterol <2034m calories 30% from fat and 7% or less from saturated fats, daily to have 5 or more servings of fruits and vegetables.  Biometrics:     Pre Biometrics - 10/22/16 1453      Pre Biometrics   Height 5' 5.5" (1.664 m)   Weight 224 lb 3.3 oz (101.7 kg)   Waist  Circumference 44.25 inches   Hip Circumference 51 inches   Waist to Hip Ratio 0.87 %   BMI (Calculated) 36.8   Triceps Skinfold 40 mm   % Body Fat 48.5 %   Grip Strength 33.5 kg   Flexibility 7.5 in   Single Leg Stand 2.53 seconds       Nutrition Therapy Plan and Nutrition Goals:   Nutrition Discharge: Nutrition Scores:   Nutrition Goals Re-Evaluation:   Nutrition Goals Re-Evaluation:   Nutrition Goals Discharge (Final Nutrition Goals Re-Evaluation):   Psychosocial: Target Goals: Acknowledge presence or absence of significant depression and/or stress, maximize coping skills, provide positive support system. Participant is able to verbalize types and ability to use techniques and skills needed for reducing stress and depression.  Initial Review & Psychosocial Screening:     Initial Psych Review & Screening - 10/22/16 1635      Initial Review   Current issues with Current Anxiety/Panic  Pt feels her effexor works well for her     FaBlissYes   Comments Pt husband accompanied pt to orientation appointment     Barriers   Psychosocial barriers to participate in program The patient should benefit from training in stress management and relaxation.      Quality of Life Scores:     Quality of Life - 10/22/16 1515      Quality of Life Scores   Health/Function Pre 28.29 %   Socioeconomic Pre 30 %   Psych/Spiritual Pre 29.14 %   Family Pre 30 %   GLOBAL Pre 29.09 %      PHQ-9: Recent Review Flowsheet Data    [facility] 09/23/2016 11/14/2015 06/03/2014   Decreased Interest 0 0 0   Down, Depressed, Hopeless 0 0 0   PHQ - 2 Score 0 0 0     Interpretation of Total Score  Total Score Depression Severity:  1-4 = Minimal depression, 5-9 = Mild depression, 10-14 = Moderate depression, 15-19 = Moderately severe depression, 20-27 = Severe depression   Psychosocial Evaluation and Intervention:   Psychosocial  Re-Evaluation:   Psychosocial Discharge (Final Psychosocial Re-Evaluation):   Vocational Rehabilitation: Provide vocational rehab assistance to qualifying candidates.   Vocational Rehab Evaluation & Intervention:     Vocational Rehab - 10/22/16 1637      Initial Vocational Rehab Evaluation & Intervention   Assessment shows need for Vocational Rehabilitation No  Pt does not plan to return to competitive employment      Education: Education Goals: Education classes will be provided on a weekly basis, covering required topics. Participant will state understanding/return demonstration of topics presented.  Learning Barriers/Preferences:     Learning Barriers/Preferences - 10/22/16 1520      Learning Barriers/Preferences   Learning Barriers None   Learning Preferences Skilled Demonstration      Education Topics: Count Your Pulse:  -Group instruction provided by verbal instruction, demonstration, patient participation and written materials  to support subject.  Instructors address importance of being able to find your pulse and how to count your pulse when at home without a heart monitor.  Patients get hands on experience counting their pulse with staff help and individually.   Heart Attack, Angina, and Risk Factor Modification:  -Group instruction provided by verbal instruction, video, and written materials to support subject.  Instructors address signs and symptoms of angina and heart attacks.    Also discuss risk factors for heart disease and how to make changes to improve heart health risk factors.   Functional Fitness:  -Group instruction provided by verbal instruction, demonstration, patient participation, and written materials to support subject.  Instructors address safety measures for doing things around the house.  Discuss how to get up and down off the floor, how to pick things up properly, how to safely get out of a chair without assistance, and balance  training.   Meditation and Mindfulness:  -Group instruction provided by verbal instruction, patient participation, and written materials to support subject.  Instructor addresses importance of mindfulness and meditation practice to help reduce stress and improve awareness.  Instructor also leads participants through a meditation exercise.    Stretching for Flexibility and Mobility:  -Group instruction provided by verbal instruction, patient participation, and written materials to support subject.  Instructors lead participants through series of stretches that are designed to increase flexibility thus improving mobility.  These stretches are additional exercise for major muscle groups that are typically performed during regular warm up and cool down.   Hands Only CPR Anytime:  -Group instruction provided by verbal instruction, video, patient participation and written materials to support subject.  Instructors co-teach with AHA video for hands only CPR.  Participants get hands on experience with mannequins.   Nutrition I class: Heart Healthy Eating:  -Group instruction provided by PowerPoint slides, verbal discussion, and written materials to support subject matter. The instructor gives an explanation and review of the Therapeutic Lifestyle Changes diet recommendations, which includes a discussion on lipid goals, dietary fat, sodium, fiber, plant stanol/sterol esters, sugar, and the components of a well-balanced, healthy diet.   Nutrition II class: Lifestyle Skills:  -Group instruction provided by PowerPoint slides, verbal discussion, and written materials to support subject matter. The instructor gives an explanation and review of label reading, grocery shopping for heart health, heart healthy recipe modifications, and ways to make healthier choices when eating out.   Diabetes Question & Answer:  -Group instruction provided by PowerPoint slides, verbal discussion, and written materials to  support subject matter. The instructor gives an explanation and review of diabetes co-morbidities, pre- and post-prandial blood glucose goals, pre-exercise blood glucose goals, signs, symptoms, and treatment of hypoglycemia and hyperglycemia, and foot care basics.   Diabetes Blitz:  -Group instruction provided by PowerPoint slides, verbal discussion, and written materials to support subject matter. The instructor gives an explanation and review of the physiology behind type 1 and type 2 diabetes, diabetes medications and rational behind using different medications, pre- and post-prandial blood glucose recommendations and Hemoglobin A1c goals, diabetes diet, and exercise including blood glucose guidelines for exercising safely.    Portion Distortion:  -Group instruction provided by PowerPoint slides, verbal discussion, written materials, and food models to support subject matter. The instructor gives an explanation of serving size versus portion size, changes in portions sizes over the last 20 years, and what consists of a serving from each food group.   Stress Management:  -Group instruction provided by verbal  instruction, video, and written materials to support subject matter.  Instructors review role of stress in heart disease and how to cope with stress positively.     Exercising on Your Own:  -Group instruction provided by verbal instruction, power point, and written materials to support subject.  Instructors discuss benefits of exercise, components of exercise, frequency and intensity of exercise, and end points for exercise.  Also discuss use of nitroglycerin and activating EMS.  Review options of places to exercise outside of rehab.  Review guidelines for sex with heart disease.   Cardiac Drugs I:  -Group instruction provided by verbal instruction and written materials to support subject.  Instructor reviews cardiac drug classes: antiplatelets, anticoagulants, beta blockers, and statins.   Instructor discusses reasons, side effects, and lifestyle considerations for each drug class.   Cardiac Drugs II:  -Group instruction provided by verbal instruction and written materials to support subject.  Instructor reviews cardiac drug classes: angiotensin converting enzyme inhibitors (ACE-I), angiotensin II receptor blockers (ARBs), nitrates, and calcium channel blockers.  Instructor discusses reasons, side effects, and lifestyle considerations for each drug class.   Anatomy and Physiology of the Circulatory System:  -Group instruction provided by verbal instruction, video, and written materials to support subject.  Reviews functional anatomy of heart, how it relates to various diagnoses, and what role the heart plays in the overall system.   Knowledge Questionnaire Score:     Knowledge Questionnaire Score - 10/22/16 1512      Knowledge Questionnaire Score   Pre Score 19/24      Core Components/Risk Factors/Patient Goals at Admission:     Personal Goals and Risk Factors at Admission - 10/22/16 1445      Core Components/Risk Factors/Patient Goals on Admission    Weight Management Obesity;Yes   Intervention Weight Management/Obesity: Establish reasonable short term and long term weight goals.;Obesity: Provide education and appropriate resources to help participant work on and attain dietary goals.   Admit Weight 224 lb 3.3 oz (101.7 kg)   Goal Weight: Short Term 218 lb 4.8 oz (99 kg)   Goal Weight: Long Term 200 lb 4.8 oz (90.9 kg)   Expected Outcomes Short Term: Continue to assess and modify interventions until short term weight is achieved;Long Term: Adherence to nutrition and physical activity/exercise program aimed toward attainment of established weight goal;Weight Loss: Understanding of general recommendations for a balanced deficit meal plan, which promotes 1-2 lb weight loss per week and includes a negative energy balance of [phone] kcal/d   Diabetes Yes   Intervention  Provide education about signs/symptoms and action to take for hypo/hyperglycemia.;Provide education about proper nutrition, including hydration, and aerobic/resistive exercise prescription along with prescribed medications to achieve blood glucose in normal ranges: Fasting glucose 65-99 mg/dL   Expected Outcomes Short Term: Participant verbalizes understanding of the signs/symptoms and immediate care of hyper/hypoglycemia, proper foot care and importance of medication, aerobic/resistive exercise and nutrition plan for blood glucose control.;Long Term: Attainment of HbA1C < 7%.   Hypertension Yes   Intervention Provide education on lifestyle modifcations including regular physical activity/exercise, weight management, moderate sodium restriction and increased consumption of fresh fruit, vegetables, and low fat dairy, alcohol moderation, and smoking cessation.;Monitor prescription use compliance.   Expected Outcomes Short Term: Continued assessment and intervention until BP is < 140/22m HG in hypertensive participants. < 130/83mHG in hypertensive participants with diabetes, heart failure or chronic kidney disease.;Long Term: Maintenance of blood pressure at goal levels.   Lipids Yes   Intervention Provide education  and support for participant on nutrition & aerobic/resistive exercise along with prescribed medications to achieve LDL <74m, HDL >448m   Expected Outcomes Short Term: Participant states understanding of desired cholesterol values and is compliant with medications prescribed. Participant is following exercise prescription and nutrition guidelines.;Long Term: Cholesterol controlled with medications as prescribed, with individualized exercise RX and with personalized nutrition plan. Value goals: LDL < 7046mHDL > 40 mg.   Personal Goal Other Yes   Personal Goal Get back to moving/exercise routine. Have energy like prior to surgery. Long term goal: decrease BMI from obese category to overweight  category.   Intervention Provide individualized exercise program including stretching, resistance training and aerobic exercise guidelines with safe parameters for exercise.   Expected Outcomes Resume home exercise routine in addition to exercise at cardiac rehab based on guidelines provided. Achieve safe and appropriate short term and long term weight loss goals. Increased energy level based on functional fitness tests and personal statement of partiicipant.      Core Components/Risk Factors/Patient Goals Review:    Core Components/Risk Factors/Patient Goals at Discharge (Final Review):    ITP Comments:     ITP Comments    Row Name 10/22/16 1348           ITP Comments Medical Director:  Dr. TraFransico Him        Comments:  Patient attended orientation from 1330 to 1500 to review rules and guidelines for program. As a part of the orientation appt, pt  completed 6 minute walk test and warm up stretches, Intitial ITP, and exercise prescription.  VSS. Telemetry- sr with slight st depression and occ. Sinus arrhythmias. Asymptomatic. Brief Psychosocial Assessment reveals supportive husband who is here with her today and plans to accompany pt to exercise and nutrition classes.  Pt has history of anxiety and feels her Effexor works well for her.  Pt is looking forward to attending cardiac rehab for full exercise next week. CarCherre HugerSN Cardiac and PulTraining and development officer

## 2016-10-23 ENCOUNTER — Encounter: Payer: Self-pay | Admitting: Thoracic Surgery (Cardiothoracic Vascular Surgery)

## 2016-10-28 ENCOUNTER — Encounter (HOSPITAL_COMMUNITY): Payer: Medicare Other

## 2016-10-28 ENCOUNTER — Encounter (HOSPITAL_COMMUNITY)
Admission: RE | Admit: 2016-10-28 | Discharge: 2016-10-28 | Disposition: A | Discharge status: Home / Self Care | Payer: Medicare Other | Source: Ambulatory Visit | Attending: Cardiology | Admitting: Cardiology

## 2016-10-28 DIAGNOSIS — Z953 Presence of xenogenic heart valve: Secondary | ICD-10-CM | POA: Diagnosis not present

## 2016-10-28 DIAGNOSIS — Z952 Presence of prosthetic heart valve: Secondary | ICD-10-CM

## 2016-10-28 LAB — GLUCOSE, CAPILLARY
Glucose-Capillary: 110 mg/dL — ABNORMAL HIGH (ref 65–99)
Glucose-Capillary: 101 mg/dL — ABNORMAL HIGH (ref 65–99)

## 2016-10-28 NOTE — Progress Notes (Signed)
Daily Session Note  Patient Details  Name: [name] MRN: [id number] Date of Birth: 1950-08-11 Referring Provider:     CARDIAC REHAB PHASE II ORIENTATION from 10/22/2016 in [place]  Referring Provider  Ottie Glazier, MD.      Encounter Date: 10/28/2016  Check In:     Session Check In - 10/28/16 1453      Check-In   Location [facility]   Staff Present Su Hilt, MS, ACSM RCEP, Exercise Physiologist;Ashley Armstrong, MS, Exercise Physiologist;Stefen Juba, RN, BSN   Supervising physician immediately available to respond to emergencies [facility] immediately available   Physician(s) Dr. Sloan Leiter   Medication changes reported     No   Fall or balance concerns reported    No   Tobacco Cessation No Change   Warm-up and Cool-down Performed as group-led instruction   Resistance Training Performed Yes   VAD Patient? No     Pain Assessment   Currently in Pain? No/denies   Multiple Pain Sites No      Capillary Blood Glucose: Results for orders placed or performed during the hospital encounter of 10/28/16 (from the past 24 hour(s))  Glucose, capillary     Status: Abnormal   Collection Time: 10/28/16  2:51 PM  Result Value Ref Range   Glucose-Capillary 110 (H) 65 - 99 mg/dL  Glucose, capillary     Status: Abnormal   Collection Time: 10/28/16  3:44 PM  Result Value Ref Range   Glucose-Capillary 101 (H) 65 - 99 mg/dL      History  Smoking Status  . Never Smoker  Smokeless Tobacco  . Never Used    Goals Met:  Exercise tolerated well  Goals Unmet:  Not Applicable  Comments: [name] started cardiac rehab today.  Pt tolerated light exercise without difficulty. VSS, telemetry-Sinus Rhythm, asymptomatic.  Medication list reconciled. Pt denies barriers to medicaiton compliance.  PSYCHOSOCIAL ASSESSMENT:  PHQ-0. Pt exhibits positive coping skills, hopeful outlook with supportive family. No psychosocial  needs identified at this time, no psychosocial interventions necessary.    Pt enjoys playing with her dogs..   Pt oriented to exercise equipment and routine.    Understanding verbalized. Barnet Pall, RN,BSN 10/28/2016 5:27 PM   Dr. Fransico Him is Medical Director for Cardiac Rehab at [facility].

## 2016-10-30 ENCOUNTER — Encounter (HOSPITAL_COMMUNITY): Payer: Medicare Other

## 2016-10-30 ENCOUNTER — Encounter (HOSPITAL_COMMUNITY)
Admission: RE | Admit: 2016-10-30 | Discharge: 2016-10-30 | Disposition: A | Discharge status: Home / Self Care | Payer: Medicare Other | Source: Ambulatory Visit | Attending: Cardiology | Admitting: Cardiology

## 2016-10-30 DIAGNOSIS — Z952 Presence of prosthetic heart valve: Secondary | ICD-10-CM

## 2016-10-30 DIAGNOSIS — Z953 Presence of xenogenic heart valve: Secondary | ICD-10-CM | POA: Diagnosis not present

## 2016-10-30 LAB — GLUCOSE, CAPILLARY: Glucose-Capillary: 111 mg/dL — ABNORMAL HIGH (ref 65–99)

## 2016-11-01 ENCOUNTER — Encounter (HOSPITAL_COMMUNITY): Payer: Medicare Other

## 2016-11-01 ENCOUNTER — Encounter (HOSPITAL_COMMUNITY)
Admission: RE | Admit: 2016-11-01 | Discharge: 2016-11-01 | Disposition: A | Discharge status: Home / Self Care | Payer: Medicare Other | Source: Ambulatory Visit | Attending: Cardiology | Admitting: Cardiology

## 2016-11-01 DIAGNOSIS — Z953 Presence of xenogenic heart valve: Secondary | ICD-10-CM | POA: Diagnosis not present

## 2016-11-01 DIAGNOSIS — Z952 Presence of prosthetic heart valve: Secondary | ICD-10-CM

## 2016-11-01 LAB — GLUCOSE, CAPILLARY
Glucose-Capillary: 106 mg/dL — ABNORMAL HIGH (ref 65–99)
Glucose-Capillary: 121 mg/dL — ABNORMAL HIGH (ref 65–99)
Glucose-Capillary: 113 mg/dL — ABNORMAL HIGH (ref 65–99)

## 2016-11-03 ENCOUNTER — Encounter: Payer: Self-pay | Admitting: Pulmonary Disease

## 2016-11-04 ENCOUNTER — Encounter (HOSPITAL_COMMUNITY): Payer: Medicare Other

## 2016-11-04 ENCOUNTER — Encounter (HOSPITAL_COMMUNITY)
Admission: RE | Admit: 2016-11-04 | Discharge: 2016-11-04 | Disposition: A | Discharge status: Home / Self Care | Payer: Medicare Other | Source: Ambulatory Visit | Attending: Cardiology | Admitting: Cardiology

## 2016-11-04 DIAGNOSIS — Z953 Presence of xenogenic heart valve: Secondary | ICD-10-CM | POA: Diagnosis not present

## 2016-11-04 DIAGNOSIS — Z952 Presence of prosthetic heart valve: Secondary | ICD-10-CM

## 2016-11-04 LAB — GLUCOSE, CAPILLARY
Glucose-Capillary: 135 mg/dL — ABNORMAL HIGH (ref 65–99)
Glucose-Capillary: 111 mg/dL — ABNORMAL HIGH (ref 65–99)

## 2016-11-06 ENCOUNTER — Encounter (HOSPITAL_COMMUNITY): Payer: Medicare Other

## 2016-11-06 ENCOUNTER — Encounter (HOSPITAL_COMMUNITY)
Admission: RE | Admit: 2016-11-06 | Discharge: 2016-11-06 | Disposition: A | Discharge status: Home / Self Care | Payer: Medicare Other | Source: Ambulatory Visit | Attending: Cardiology | Admitting: Cardiology

## 2016-11-06 DIAGNOSIS — Z952 Presence of prosthetic heart valve: Secondary | ICD-10-CM

## 2016-11-06 DIAGNOSIS — Z953 Presence of xenogenic heart valve: Secondary | ICD-10-CM | POA: Diagnosis not present

## 2016-11-08 ENCOUNTER — Encounter (HOSPITAL_COMMUNITY): Payer: Medicare Other

## 2016-11-08 ENCOUNTER — Encounter (HOSPITAL_COMMUNITY)
Admission: RE | Admit: 2016-11-08 | Discharge: 2016-11-08 | Disposition: A | Discharge status: Home / Self Care | Payer: Medicare Other | Source: Ambulatory Visit | Attending: Cardiology | Admitting: Cardiology

## 2016-11-08 DIAGNOSIS — Z953 Presence of xenogenic heart valve: Secondary | ICD-10-CM | POA: Diagnosis not present

## 2016-11-08 DIAGNOSIS — Z952 Presence of prosthetic heart valve: Secondary | ICD-10-CM

## 2016-11-09 ENCOUNTER — Telehealth: Payer: Self-pay | Admitting: Pulmonary Disease

## 2016-11-09 NOTE — Telephone Encounter (Signed)
   CPAP DL last month :  93%, AHI 3.2 autocpap 5-10 cm water  Jasmine : pls tell pt the DL last month was great.  Keep up the good work!  J. Shirl Harris, MD 11/09/2016, 3:01 PM [facility] Pager [phone] After 3 pm or if no answer, call [phone]

## 2016-11-11 ENCOUNTER — Ambulatory Visit (INDEPENDENT_AMBULATORY_CARE_PROVIDER_SITE_OTHER): Payer: Self-pay | Admitting: Thoracic Surgery (Cardiothoracic Vascular Surgery)

## 2016-11-11 ENCOUNTER — Encounter (HOSPITAL_COMMUNITY): Admission: RE | Admit: 2016-11-11 | Payer: Medicare Other | Source: Ambulatory Visit

## 2016-11-11 ENCOUNTER — Encounter: Payer: Self-pay | Admitting: Thoracic Surgery (Cardiothoracic Vascular Surgery)

## 2016-11-11 VITALS — BP 167/75 | HR 65 | Resp 18 | Ht 65.5 in | Wt 226.0 lb

## 2016-11-11 DIAGNOSIS — Z953 Presence of xenogenic heart valve: Secondary | ICD-10-CM

## 2016-11-11 DIAGNOSIS — I350 Nonrheumatic aortic (valve) stenosis: Secondary | ICD-10-CM

## 2016-11-11 DIAGNOSIS — Z9889 Other specified postprocedural states: Secondary | ICD-10-CM

## 2016-11-11 NOTE — Patient Instructions (Signed)
Continue all previous medications without any changes at this time  You may resume unrestricted physical activity without any particular limitations at this time.  You have been advised of the numerous benefits associated with regular exercise and weight loss.  The long term benefits for your overall health and wellbeing cannot be overestimated.  Endocarditis is a potentially serious infection of heart valves or inside lining of the heart.  It occurs more commonly in patients with diseased heart valves (such as patient's with aortic or mitral valve disease) and in patients who have undergone heart valve repair or replacement.  Certain surgical and dental procedures may put you at risk, such as dental cleaning, other dental procedures, or any surgery involving the respiratory, urinary, gastrointestinal tract, gallbladder or prostate gland.   To minimize your chances for develooping endocarditis, maintain good oral health and seek prompt medical attention for any infections involving the mouth, teeth, gums, skin or urinary tract.    Always notify your doctor or dentist about your underlying heart valve condition before having any invasive procedures. You will need to take antibiotics before certain procedures, including all routine dental cleanings or other dental procedures.  Your cardiologist or dentist should prescribe these antibiotics for you to be taken ahead of time.

## 2016-11-11 NOTE — Progress Notes (Signed)
[address]       [place] [address]             [phone]     CARDIOTHORACIC SURGERY OFFICE NOTE  Referring Provider is Dorothy Spark, MD PCP is [name], MD   HPI:  Patient is a 66 year old obese white female with history of aortic stenosis, atypical chest pain, obstructive sleep apnea, GE reflux disease, and type 2 diabetes mellitus who returns to the office today for routine follow-up status post aortic valve replacement using a rapid deployment bovine pericardial bioprosthetic tissue valve via partial upper mini sternotomy on 08/23/2016 for severe symptomatic aortic stenosis.  The patient's early postoperative recovery was uneventful and she was discharged home on the fourth postoperative day. The patient has done well since hospital discharge. She was seen in our office 09/16/2016 at which time she was doing well. Since then she has continued to do very well. She has been actively participating in the cardiac rehabilitation program and making good progress. She no longer has any significant pain in her chest. She states that her exercise tolerance continues to gradually improve. She reports no exertional shortness of breath. She states that her blood pressure has been well-controlled. Overall she is pleased.   Current Outpatient Prescriptions  Medication Sig Dispense Refill  . acetaminophen (TYLENOL) 500 MG tablet Take 2 tablets (1,000 mg total) by mouth every 6 (six) hours as needed. 30 tablet 0  . aspirin EC 325 MG EC tablet Take 1 tablet (325 mg total) by mouth daily. 30 tablet 0  . atorvastatin (LIPITOR) 40 MG tablet TAKE 1 TABLET BY MOUTH AT NIGHT ON TUESDAY AND FRIDAY 30 tablet 4  . Biotin 5000 MCG TABS Take 5,000 mcg by mouth daily.     . Blood Glucose Monitoring Suppl (ONETOUCH VERIO) W/DEVICE KIT 1 kit by Does not apply route once. 1 kit 0  . calcium-vitamin D (OSCAL WITH D) 500-200 MG-UNIT per tablet Take 2 tablets by mouth daily.       . clindamycin (CLEOCIN) 300 MG capsule Take 2 capsules (600 mg total) by mouth 2 hours prior too dental cleaning. 2 capsule 0  . fluticasone (FLONASE) 50 MCG/ACT nasal spray Place 2 sprays into both nostrils daily.     . furosemide (LASIX) 40 MG tablet Take 1 tablet (40 mg total) by mouth daily. 60 tablet 11  . glucose blood (ONETOUCH VERIO) test strip Use to test blood sugars daily. Dx: E11.9 100 each 12  . ipratropium (ATROVENT) 0.03 % nasal spray Place 2 sprays into both nostrils 2 (two) times daily.   3  . Lancets (ONETOUCH ULTRASOFT) lancets Use to test blood sugars daily. Dx: E11.9 100 each 12  . loratadine (CLARITIN) 10 MG tablet Take 10 mg by mouth at bedtime.     . metFORMIN (GLUCOPHAGE) 500 MG tablet TAKE 1 TABLET WITH BREAKFAST AND 1/2 TO 1 TABLET WITH DINNER. (Patient taking differently: TAKE 1 TABLET (500 MG) WITH BREAKFAST AND DINNER.) 60 tablet 5  . metoCLOPramide (REGLAN) 10 MG tablet TAKE 1/2 TABLET BY MOUTH 4 TIMES A DAY (Patient taking differently: TAKE 1/2 TABLET (5 MG) BY MOUTH 4 TIMES A DAY) 60 tablet 5  . metoprolol tartrate (LOPRESSOR) 25 MG tablet Take 0.5 tablets (12.5 mg total) by mouth 2 (two) times daily. 60 tablet 11  . Multiple Vitamin (MULTIVITAMIN WITH MINERALS) TABS tablet Take 1 tablet by mouth daily.    . nabumetone (RELAFEN) 750  MG tablet TAKE 2 TABLETS BY MOUTH EVERY DAY 60 tablet 5  . omeprazole (PRILOSEC OTC) 20 MG tablet Take 40 mg by mouth daily with breakfast.    . potassium chloride SA (K-DUR,KLOR-CON) 20 MEQ tablet Take 1 tablet (20 mEq total) by mouth daily. 60 tablet 11  . Probiotic Product (ALIGN) 4 MG CAPS Take 4 mg by mouth daily.     . psyllium (METAMUCIL SMOOTH TEXTURE) 28 % packet Take 1 packet by mouth daily before breakfast.     . SUMAtriptan (IMITREX) 100 MG tablet TAKE 1 TABLET BY MOUTH EVERY 2 HOURS AS NEEDED FOR MIGRAINE (Patient taking differently: Take 100 mg by mouth every 2 (two) hours as needed. TAKE 1 TABLET BY MOUTH EVERY 2 HOURS AS  NEEDED FOR MIGRAINE. DO NOT EXCEED 2 DOSES IN 24 HOURS.) 10 tablet 3  . venlafaxine XR (EFFEXOR-XR) 150 MG 24 hr capsule TAKE ONE CAPSULE BY MOUTH EVERY DAY WITH BREAKFAST 30 capsule 5   No current facility-administered medications for this visit.       Physical Exam:   BP (!) 167/75   Pulse 65   Resp 18   Ht 5' 5.5" (1.664 m)   Wt 226 lb (102.5 kg)   SpO2 98%   BMI 37.04 kg/m   General:  Well-appearing  Chest:   Clear to auscultation  CV:   Regular rate and rhythm without murmur  Incisions:  Completely healed, sternum is stable  Abdomen:  Soft and nontender  Extremities:  Warm and well-perfused  Diagnostic Tests:  n/a   Impression:  Patient is doing very well approximately 3 months status post aortic valve replacement using a rapid deployment bovine pericardial bioprosthetic tissue valve via partial upper mini sternotomy.  Plan:  I have encouraged the patient to continue to gradually increase her physical activity as tolerated without any particular limitations at this time. She has asked about stopping the rehabilitation program and transitioning to exercise at her local gym. I think this is reasonable as long as she remains diligent about exercising on a regular basis. We discussed the many benefits of continued exercise, healthy diet, and weight loss. We have not recommended any changes in her current medications.  The patient has been reminded regarding the importance of dental hygiene and the lifelong need for antibiotic prophylaxis for all dental cleanings and other related invasive procedures.    Valentina Gu. Roxy Manns, MD 11/11/2016 2:36 PM

## 2016-11-13 ENCOUNTER — Encounter (HOSPITAL_COMMUNITY)
Admission: RE | Admit: 2016-11-13 | Discharge: 2016-11-13 | Disposition: A | Discharge status: Home / Self Care | Payer: Medicare Other | Source: Ambulatory Visit | Attending: Cardiology | Admitting: Cardiology

## 2016-11-13 ENCOUNTER — Encounter (HOSPITAL_COMMUNITY): Payer: Medicare Other

## 2016-11-13 DIAGNOSIS — Z952 Presence of prosthetic heart valve: Secondary | ICD-10-CM

## 2016-11-13 DIAGNOSIS — Z953 Presence of xenogenic heart valve: Secondary | ICD-10-CM | POA: Diagnosis not present

## 2016-11-13 NOTE — Telephone Encounter (Signed)
Pt is aware of DL report and voiced her understanding. Nothing further needed.

## 2016-11-13 NOTE — Telephone Encounter (Signed)
[id number] x1 

## 2016-11-13 NOTE — Telephone Encounter (Signed)
Patient returned phone call..ert ° °

## 2016-11-15 ENCOUNTER — Encounter (HOSPITAL_COMMUNITY)
Admission: RE | Admit: 2016-11-15 | Discharge: 2016-11-15 | Disposition: A | Discharge status: Home / Self Care | Payer: Medicare Other | Source: Ambulatory Visit | Attending: Cardiology | Admitting: Cardiology

## 2016-11-15 ENCOUNTER — Encounter (HOSPITAL_COMMUNITY): Payer: Medicare Other

## 2016-11-15 DIAGNOSIS — Z953 Presence of xenogenic heart valve: Secondary | ICD-10-CM | POA: Diagnosis not present

## 2016-11-15 DIAGNOSIS — Z952 Presence of prosthetic heart valve: Secondary | ICD-10-CM

## 2016-11-20 ENCOUNTER — Encounter (HOSPITAL_COMMUNITY): Admission: RE | Admit: 2016-11-20 | Payer: Medicare Other | Source: Ambulatory Visit

## 2016-11-20 ENCOUNTER — Encounter (HOSPITAL_COMMUNITY): Payer: Medicare Other

## 2016-11-20 NOTE — Progress Notes (Signed)
Reviewed home exercise program with patient.  Discussed mode/frequency of exercise, target heart rate range, RPE scale and weather conditions for exercising outdoors.  Also, discussed signs and symptoms and when to call 911/Dr. Pt verbalized understanding.  Cleda Mccreedy 11/20/2016 4818

## 2016-11-22 ENCOUNTER — Encounter (HOSPITAL_COMMUNITY): Payer: Medicare Other

## 2016-11-22 ENCOUNTER — Encounter (HOSPITAL_COMMUNITY)
Admission: RE | Admit: 2016-11-22 | Discharge: 2016-11-22 | Disposition: A | Discharge status: Home / Self Care | Payer: Medicare Other | Source: Ambulatory Visit | Attending: Cardiology | Admitting: Cardiology

## 2016-11-22 ENCOUNTER — Telehealth (HOSPITAL_COMMUNITY): Payer: Self-pay

## 2016-11-22 DIAGNOSIS — Z7982 Long term (current) use of aspirin: Secondary | ICD-10-CM | POA: Insufficient documentation

## 2016-11-22 DIAGNOSIS — Z79899 Other long term (current) drug therapy: Secondary | ICD-10-CM | POA: Insufficient documentation

## 2016-11-22 DIAGNOSIS — Z953 Presence of xenogenic heart valve: Secondary | ICD-10-CM | POA: Insufficient documentation

## 2016-11-22 DIAGNOSIS — E785 Hyperlipidemia, unspecified: Secondary | ICD-10-CM | POA: Insufficient documentation

## 2016-11-22 DIAGNOSIS — Z7984 Long term (current) use of oral hypoglycemic drugs: Secondary | ICD-10-CM | POA: Insufficient documentation

## 2016-11-22 DIAGNOSIS — E119 Type 2 diabetes mellitus without complications: Secondary | ICD-10-CM | POA: Insufficient documentation

## 2016-11-25 ENCOUNTER — Encounter (HOSPITAL_COMMUNITY): Payer: Medicare Other

## 2016-11-25 ENCOUNTER — Encounter (HOSPITAL_COMMUNITY)
Admission: RE | Admit: 2016-11-25 | Discharge: 2016-11-25 | Disposition: A | Discharge status: Home / Self Care | Payer: Medicare Other | Source: Ambulatory Visit | Attending: Cardiology | Admitting: Cardiology

## 2016-11-25 DIAGNOSIS — Z952 Presence of prosthetic heart valve: Secondary | ICD-10-CM | POA: Diagnosis present

## 2016-11-25 DIAGNOSIS — E785 Hyperlipidemia, unspecified: Secondary | ICD-10-CM | POA: Diagnosis not present

## 2016-11-25 DIAGNOSIS — Z7982 Long term (current) use of aspirin: Secondary | ICD-10-CM | POA: Diagnosis not present

## 2016-11-25 DIAGNOSIS — Z953 Presence of xenogenic heart valve: Secondary | ICD-10-CM | POA: Diagnosis not present

## 2016-11-25 DIAGNOSIS — Z7984 Long term (current) use of oral hypoglycemic drugs: Secondary | ICD-10-CM | POA: Diagnosis not present

## 2016-11-25 DIAGNOSIS — E119 Type 2 diabetes mellitus without complications: Secondary | ICD-10-CM | POA: Diagnosis not present

## 2016-11-25 DIAGNOSIS — Z79899 Other long term (current) drug therapy: Secondary | ICD-10-CM | POA: Diagnosis not present

## 2016-11-25 NOTE — Addendum Note (Signed)
Addendum  created 11/25/16 1154 by Oleta Mouse, MD   Sign clinical note

## 2016-11-27 ENCOUNTER — Encounter (HOSPITAL_COMMUNITY): Payer: Medicare Other

## 2016-11-27 ENCOUNTER — Encounter (HOSPITAL_COMMUNITY)
Admission: RE | Admit: 2016-11-27 | Discharge: 2016-11-27 | Disposition: A | Discharge status: Home / Self Care | Payer: Medicare Other | Source: Ambulatory Visit | Attending: Cardiology | Admitting: Cardiology

## 2016-11-27 DIAGNOSIS — Z953 Presence of xenogenic heart valve: Secondary | ICD-10-CM | POA: Diagnosis not present

## 2016-11-27 DIAGNOSIS — Z952 Presence of prosthetic heart valve: Secondary | ICD-10-CM

## 2016-11-27 NOTE — Progress Notes (Signed)
[name] 66 y.o. female Nutrition Note Spoke with pt. Nutrition Plan and Nutrition Survey goals reviewed with pt. Pt is following Step 2 of the Therapeutic Lifestyle Changes diet. Pt wants to lose wt. Wt loss tips reviewed. Pt is diabetic. Last A1c indicates blood glucose well-controlled. This Probation officer went over Diabetes Education test results. Pt checks CBG's once a week "a couple of hours after I've eaten."  Pt with dx of CHF. Pt unable to eat a low sodium diet due to frequency of eating out. Ways to decrease sodium when eating out discussed. Pt expressed understanding of the information reviewed. Pt aware of nutrition education classes offered.  Lab Results  Component Value Date   HGBA1C 6.6 (H) 08/21/2016   Wt Readings from Last 3 Encounters:  11/11/16 226 lb (102.5 kg)  10/22/16 224 lb 3.3 oz (101.7 kg)  09/26/16 214 lb 12.8 oz (97.4 kg)    Nutrition Diagnosis ? Food-and nutrition-related knowledge deficit related to lack of exposure to information as related to diagnosis of: ? CHF ? DM ? Obesity related to excessive energy intake as evidenced by a BMI of 36.8  Nutrition Intervention ? Pt's individual nutrition plan reviewed with pt. ? Benefits of adopting Therapeutic Lifestyle Changes discussed when Medficts reviewed. ? Pt to attend the Portion Distortion class ? Pt to attend the Diabetes Q & A class - met; 11/01/16 ? Pt given handouts for: ? Nutrition I class ? Nutrition II class ? Diabetes Blitz Class ? Continue client-centered nutrition education by RD, as part of interdisciplinary care. Goal(s) ? Pt to identify food quantities necessary to achieve weight loss of 6-24 lb (2.7-10.9 kg) at graduation from cardiac rehab.  Monitor and Evaluate progress toward nutrition goal with team. Derek Mound, M.Ed, RD, LDN, CDE 11/27/2016 3:57 PM

## 2016-11-29 ENCOUNTER — Encounter: Payer: Self-pay | Admitting: Cardiology

## 2016-11-29 ENCOUNTER — Encounter: Payer: Self-pay | Admitting: Pulmonary Disease

## 2016-11-29 ENCOUNTER — Encounter (HOSPITAL_COMMUNITY): Payer: Medicare Other

## 2016-11-29 ENCOUNTER — Encounter (HOSPITAL_COMMUNITY)
Admission: RE | Admit: 2016-11-29 | Discharge: 2016-11-29 | Disposition: A | Discharge status: Home / Self Care | Payer: Medicare Other | Source: Ambulatory Visit | Attending: Cardiology | Admitting: Cardiology

## 2016-11-29 DIAGNOSIS — Z952 Presence of prosthetic heart valve: Secondary | ICD-10-CM

## 2016-11-29 DIAGNOSIS — Z953 Presence of xenogenic heart valve: Secondary | ICD-10-CM | POA: Diagnosis not present

## 2016-12-02 ENCOUNTER — Encounter (HOSPITAL_COMMUNITY): Payer: Medicare Other

## 2016-12-02 ENCOUNTER — Encounter (HOSPITAL_COMMUNITY)
Admission: RE | Admit: 2016-12-02 | Discharge: 2016-12-02 | Disposition: A | Discharge status: Home / Self Care | Payer: Medicare Other | Source: Ambulatory Visit | Attending: Cardiology | Admitting: Cardiology

## 2016-12-02 DIAGNOSIS — Z953 Presence of xenogenic heart valve: Secondary | ICD-10-CM | POA: Diagnosis not present

## 2016-12-02 DIAGNOSIS — Z952 Presence of prosthetic heart valve: Secondary | ICD-10-CM

## 2016-12-04 ENCOUNTER — Encounter (HOSPITAL_COMMUNITY): Payer: Medicare Other

## 2016-12-06 ENCOUNTER — Encounter (HOSPITAL_COMMUNITY): Payer: Medicare Other

## 2016-12-06 ENCOUNTER — Encounter (HOSPITAL_COMMUNITY)
Admission: RE | Admit: 2016-12-06 | Discharge: 2016-12-06 | Disposition: A | Discharge status: Home / Self Care | Payer: Medicare Other | Source: Ambulatory Visit | Attending: Cardiology | Admitting: Cardiology

## 2016-12-06 DIAGNOSIS — Z953 Presence of xenogenic heart valve: Secondary | ICD-10-CM | POA: Diagnosis not present

## 2016-12-09 ENCOUNTER — Encounter (HOSPITAL_COMMUNITY): Payer: Medicare Other

## 2016-12-09 ENCOUNTER — Encounter (HOSPITAL_COMMUNITY)
Admission: RE | Admit: 2016-12-09 | Discharge: 2016-12-09 | Disposition: A | Discharge status: Home / Self Care | Payer: Medicare Other | Source: Ambulatory Visit | Attending: Cardiology | Admitting: Cardiology

## 2016-12-09 DIAGNOSIS — Z953 Presence of xenogenic heart valve: Secondary | ICD-10-CM | POA: Diagnosis not present

## 2016-12-11 ENCOUNTER — Encounter (HOSPITAL_COMMUNITY): Payer: Medicare Other

## 2016-12-11 ENCOUNTER — Telehealth (HOSPITAL_COMMUNITY): Payer: Self-pay | Admitting: *Deleted

## 2016-12-11 ENCOUNTER — Encounter (HOSPITAL_COMMUNITY): Admission: RE | Admit: 2016-12-11 | Payer: Medicare Other | Source: Ambulatory Visit

## 2016-12-11 NOTE — Progress Notes (Signed)
Cardiac Individual Treatment Plan  Patient Details  Name: [name] MRN: [id number] Date of Birth: Oct 27, 1950 Referring Provider:     CARDIAC REHAB PHASE II ORIENTATION from 10/22/2016 in [facility]  Referring Provider  Ottie Glazier, MD.      Initial Encounter Date:    CARDIAC REHAB PHASE II ORIENTATION from 10/22/2016 in [place]  Date  10/22/16  Referring Provider  Ottie Glazier, MD.      Visit Diagnosis: No diagnosis found.  Patient's Home Medications on Admission:  Current Outpatient Prescriptions:  .  acetaminophen (TYLENOL) 500 MG tablet, Take 2 tablets (1,000 mg total) by mouth every 6 (six) hours as needed., Disp: 30 tablet, Rfl: 0 .  aspirin EC 325 MG EC tablet, Take 1 tablet (325 mg total) by mouth daily., Disp: 30 tablet, Rfl: 0 .  atorvastatin (LIPITOR) 40 MG tablet, TAKE 1 TABLET BY MOUTH AT NIGHT ON TUESDAY AND FRIDAY, Disp: 30 tablet, Rfl: 4 .  Biotin 5000 MCG TABS, Take 5,000 mcg by mouth daily. , Disp: , Rfl:  .  Blood Glucose Monitoring Suppl (ONETOUCH VERIO) W/DEVICE KIT, 1 kit by Does not apply route once., Disp: 1 kit, Rfl: 0 .  calcium-vitamin D (OSCAL WITH D) 500-200 MG-UNIT per tablet, Take 2 tablets by mouth daily. , Disp: , Rfl:  .  clindamycin (CLEOCIN) 300 MG capsule, Take 2 capsules (600 mg total) by mouth 2 hours prior too dental cleaning., Disp: 2 capsule, Rfl: 0 .  fluticasone (FLONASE) 50 MCG/ACT nasal spray, Place 2 sprays into both nostrils daily. , Disp: , Rfl:  .  furosemide (LASIX) 40 MG tablet, Take 1 tablet (40 mg total) by mouth daily., Disp: 60 tablet, Rfl: 11 .  glucose blood (ONETOUCH VERIO) test strip, Use to test blood sugars daily. Dx: E11.9, Disp: 100 each, Rfl: 12 .  ipratropium (ATROVENT) 0.03 % nasal spray, Place 2 sprays into both nostrils 2 (two) times daily. , Disp: , Rfl: 3 .  Lancets (ONETOUCH ULTRASOFT) lancets, Use to test blood sugars daily. Dx:  E11.9, Disp: 100 each, Rfl: 12 .  loratadine (CLARITIN) 10 MG tablet, Take 10 mg by mouth at bedtime. , Disp: , Rfl:  .  metFORMIN (GLUCOPHAGE) 500 MG tablet, TAKE 1 TABLET WITH BREAKFAST AND 1/2 TO 1 TABLET WITH DINNER. (Patient taking differently: TAKE 1 TABLET (500 MG) WITH BREAKFAST AND DINNER.), Disp: 60 tablet, Rfl: 5 .  metoCLOPramide (REGLAN) 10 MG tablet, TAKE 1/2 TABLET BY MOUTH 4 TIMES A DAY (Patient taking differently: TAKE 1/2 TABLET (5 MG) BY MOUTH 4 TIMES A DAY), Disp: 60 tablet, Rfl: 5 .  metoprolol tartrate (LOPRESSOR) 25 MG tablet, Take 0.5 tablets (12.5 mg total) by mouth 2 (two) times daily., Disp: 60 tablet, Rfl: 11 .  Multiple Vitamin (MULTIVITAMIN WITH MINERALS) TABS tablet, Take 1 tablet by mouth daily., Disp: , Rfl:  .  nabumetone (RELAFEN) 750 MG tablet, TAKE 2 TABLETS BY MOUTH EVERY DAY, Disp: 60 tablet, Rfl: 5 .  omeprazole (PRILOSEC OTC) 20 MG tablet, Take 40 mg by mouth daily with breakfast., Disp: , Rfl:  .  potassium chloride SA (K-DUR,KLOR-CON) 20 MEQ tablet, Take 1 tablet (20 mEq total) by mouth daily., Disp: 60 tablet, Rfl: 11 .  Probiotic Product (ALIGN) 4 MG CAPS, Take 4 mg by mouth daily. , Disp: , Rfl:  .  psyllium (METAMUCIL SMOOTH TEXTURE) 28 % packet, Take 1 packet by mouth daily before breakfast. , Disp: ,  Rfl:  .  SUMAtriptan (IMITREX) 100 MG tablet, TAKE 1 TABLET BY MOUTH EVERY 2 HOURS AS NEEDED FOR MIGRAINE (Patient taking differently: Take 100 mg by mouth every 2 (two) hours as needed. TAKE 1 TABLET BY MOUTH EVERY 2 HOURS AS NEEDED FOR MIGRAINE. DO NOT EXCEED 2 DOSES IN 24 HOURS.), Disp: 10 tablet, Rfl: 3 .  venlafaxine XR (EFFEXOR-XR) 150 MG 24 hr capsule, TAKE ONE CAPSULE BY MOUTH EVERY DAY WITH BREAKFAST, Disp: 30 capsule, Rfl: 5  Past Medical History: Past Medical History:  Diagnosis Date  . Allergy   . Anxiety   . Arthritis    back & knee  . Asthma     mild per pt shows up with resp illness  . Chronic diastolic congestive heart failure ([place])    . Colon polyps   . Diabetes mellitus without complication ([place])   . Gastric polyps   . Gastroparesis   . GERD (gastroesophageal reflux disease)   . Headache    sinus headaches and migraines at times  . Heart murmur   . History of migraine headaches   . Hyperlipidemia   . IBS (irritable bowel syndrome)   . Lumbar disc disease   . S/P aortic valve replacement with bioprosthetic valve 08/23/2016   25 mm Edwards Intuity rapid-deployment bovine pericardial tissue valve via partial upper mini sternotomy  . Sleep apnea     Tobacco Use: History  Smoking Status  . Never Smoker  Smokeless Tobacco  . Never Used    Labs: Recent Review Flowsheet Data    Labs for ITP Cardiac and Pulmonary Rehab Latest Ref Rng & Units 08/23/2016 08/23/2016 08/23/2016 08/23/2016 08/24/2016   Cholestrol 0 - 200 mg/dL - - - - -   LDLCALC 0 - 99 mg/dL - - - - -   LDLDIRECT mg/dL - - - - -   HDL >39.00 mg/dL - - - - -   Trlycerides 0.0 - 149.0 mg/dL - - - - -   Hemoglobin A1c 4.8 - 5.6 % - - - - -   PHART 7.350 - 7.450 7.317(L) 7.362 7.283(L) - -   PCO2ART 32.0 - 48.0 mmHg 47.2 44.8 47.1 - -   HCO3 20.0 - 28.0 mmol/L 24.1 25.4 22.3 - -   TCO2 0 - 100 mmol/L _0 ACIDBASEDEF 0.0 - 2.0 mmol/L 2.0 - 4.0(H) - -   O2SAT % 96.0 96.0 90.0 - -      Capillary Blood Glucose: Lab Results  Component Value Date   GLUCAP 111 (H) 11/04/2016   GLUCAP 135 (H) 11/04/2016   GLUCAP 113 (H) 11/01/2016   GLUCAP 121 (H) 11/01/2016   GLUCAP 111 (H) 10/30/2016     Exercise Target Goals:    Exercise Program Goal: Individual exercise prescription set with THRR, safety & activity barriers. Participant demonstrates ability to understand and report RPE using BORG scale, to self-measure pulse accurately, and to acknowledge the importance of the exercise prescription.  Exercise Prescription Goal: Starting with aerobic activity 30 plus minutes a day, 3 days per week for initial exercise prescription. Provide home exercise  prescription and guidelines that participant acknowledges understanding prior to discharge.  Activity Barriers & Risk Stratification:     Activity Barriers & Cardiac Risk Stratification - 10/22/16 1400      Activity Barriers & Cardiac Risk Stratification   Activity Barriers Right Knee Replacement;Arthritis;Other (comment)   Comments DJD, low, rt sided back pain.   Cardiac Risk Stratification Moderate  6 Minute Walk:     6 Minute Walk    Row Name 10/22/16 1518         6 Minute Walk   Phase Initial     Distance 1374 feet     Walk Time 6 minutes     # of Rest Breaks 0     MPH 2.6     METS 2.97     RPE 11     VO2 Peak 10.4     Symptoms No     Resting HR 65 bpm     Resting BP 103/45     Max Ex. HR 112 bpm     Max Ex. BP 142/78     2 Minute Post BP 138/80        Oxygen Initial Assessment:   Oxygen Re-Evaluation:   Oxygen Discharge (Final Oxygen Re-Evaluation):   Initial Exercise Prescription:     Initial Exercise Prescription - 10/22/16 1400      Date of Initial Exercise RX and Referring Provider   Date 10/22/16   Referring Provider Ottie Glazier, MD.     Recumbant Bike   Level 3   Watts 30   Minutes 10   METs 2.94     NuStep   Level 3   SPM 85   Minutes 10   METs 2.5     Track   Laps 11   Minutes 10   METs 2.92     Prescription Details   Frequency (times per week) 3   Duration Progress to 30 minutes of continuous aerobic without signs/symptoms of physical distress     Intensity   THRR 40-80% of Max Heartrate 62-124   Ratings of Perceived Exertion 11-13   Perceived Dyspnea 0-4     Progression   Progression Continue to progress workloads to maintain intensity without signs/symptoms of physical distress.     Resistance Training   Training Prescription Yes   Weight 3lbs.   Reps 10-15      Perform Capillary Blood Glucose checks as needed.  Exercise Prescription Changes:     Exercise Prescription Changes    Row Name  12/11/16 1400             Response to Exercise   Blood Pressure (Admit) 122/64       Blood Pressure (Exercise) 150/76       Blood Pressure (Exit) 118/72       Heart Rate (Admit) 78 bpm       Heart Rate (Exercise) 110 bpm       Heart Rate (Exit) 68 bpm       Rating of Perceived Exertion (Exercise) 12       Duration Progress to 45 minutes of aerobic exercise without signs/symptoms of physical distress       Intensity THRR unchanged         Progression   Progression Continue to progress workloads to maintain intensity without signs/symptoms of physical distress.       Average METs 2.5         Resistance Training   Training Prescription Yes       Weight 3lbs.       Reps 10-15         Recumbant Bike   Level 3       Watts 30       Minutes 10       METs 2.2         NuStep   Level 4  SPM 85       Minutes 10       METs 2.4         Track   Laps 13       Minutes 10       METs 3.26         Home Exercise Plan   Plans to continue exercise at Home (comment)       Frequency Add 3 additional days to program exercise sessions.          Exercise Comments:     Exercise Comments    Row Name 11/06/16 1711 12/11/16 1625         Exercise Comments Reviewed goals with pt.  Pt is doing well with exercise.  Reviewed goals with pt.  Pt plans to graduate from the program this Friday.          Exercise Goals and Review:     Exercise Goals    Row Name 10/22/16 1403             Exercise Goals   Increase Physical Activity Yes       Intervention Provide advice, education, support and counseling about physical activity/exercise needs.;Develop an individualized exercise prescription for aerobic and resistive training based on initial evaluation findings, risk stratification, comorbidities and participant's personal goals.       Expected Outcomes Achievement of increased cardiorespiratory fitness and enhanced flexibility, muscular endurance and strength shown through  measurements of functional capacity and personal statement of participant.       Increase Strength and Stamina Yes       Intervention Provide advice, education, support and counseling about physical activity/exercise needs.;Develop an individualized exercise prescription for aerobic and resistive training based on initial evaluation findings, risk stratification, comorbidities and participant's personal goals.       Expected Outcomes Achievement of increased cardiorespiratory fitness and enhanced flexibility, muscular endurance and strength shown through measurements of functional capacity and personal statement of participant.          Exercise Goals Re-Evaluation :     Exercise Goals Re-Evaluation    Row Name 11/06/16 1709 12/11/16 1623           Exercise Goal Re-Evaluation   Exercise Goals Review Increase Physical Activity;Increase Strenth and Stamina Increase Physical Activity;Increase Strenth and Stamina      Comments Pt states that she was exercising regularly before her cardiac event and she feels like she is getting close to her exercise capacity prior to her event Pt plans to graduate from the program before her 12 weeks, this Friday.  She has other commitments that she needs to devote her time, but she is going to continue her exercise program at [facility]      Expected Outcomes continue with exercise Rx and increase workloads as tolerated in order to increase cardiorespiratory fitness.  continue with exercise Rx and increase workloads as tolerated in order to increase cardiorespiratory fitness.           Discharge Exercise Prescription (Final Exercise Prescription Changes):     Exercise Prescription Changes - 12/11/16 1400      Response to Exercise   Blood Pressure (Admit) 122/64   Blood Pressure (Exercise) 150/76   Blood Pressure (Exit) 118/72   Heart Rate (Admit) 78 bpm   Heart Rate (Exercise) 110 bpm   Heart Rate (Exit) 68 bpm   Rating of Perceived Exertion (Exercise)  12   Duration Progress to 45 minutes of aerobic exercise  without signs/symptoms of physical distress   Intensity THRR unchanged     Progression   Progression Continue to progress workloads to maintain intensity without signs/symptoms of physical distress.   Average METs 2.5     Resistance Training   Training Prescription Yes   Weight 3lbs.   Reps 10-15     Recumbant Bike   Level 3   Watts 30   Minutes 10   METs 2.2     NuStep   Level 4   SPM 85   Minutes 10   METs 2.4     Track   Laps 13   Minutes 10   METs 3.26     Home Exercise Plan   Plans to continue exercise at Home (comment)   Frequency Add 3 additional days to program exercise sessions.      Nutrition:  Target Goals: Understanding of nutrition guidelines, daily intake of sodium <1560m, cholesterol <2039m calories 30% from fat and 7% or less from saturated fats, daily to have 5 or more servings of fruits and vegetables.  Biometrics:     Pre Biometrics - 10/22/16 1453      Pre Biometrics   Height 5' 5.5" (1.664 m)   Weight 224 lb 3.3 oz (101.7 kg)   Waist Circumference 44.25 inches   Hip Circumference 51 inches   Waist to Hip Ratio 0.87 %   BMI (Calculated) 36.8   Triceps Skinfold 40 mm   % Body Fat 48.5 %   Grip Strength 33.5 kg   Flexibility 7.5 in   Single Leg Stand 2.53 seconds       Nutrition Therapy Plan and Nutrition Goals:     Nutrition Therapy & Goals - 11/27/16 1553      Nutrition Therapy   Diet Carb Modified, Therapeutic Lifestyle Changes     Personal Nutrition Goals   Nutrition Goal Wt loss of 1-2 lb/week to a wt loss goal of 6-24 lb at [facility] regarding individualized specific dietary modifications aiming towards targeted core components such as weight, hypertension, lipid management, diabetes, heart failure and other comorbidities.   Expected Outcomes Short Term Goal: Understand basic  principles of dietary content, such as calories, fat, sodium, cholesterol and nutrients.;Long Term Goal: Adherence to prescribed nutrition plan.      Nutrition Discharge: Nutrition Scores:     Nutrition Assessments - 11/27/16 1553      MEDFICTS Scores   Pre Score 6      Nutrition Goals Re-Evaluation:   Nutrition Goals Re-Evaluation:   Nutrition Goals Discharge (Final Nutrition Goals Re-Evaluation):   Psychosocial: Target Goals: Acknowledge presence or absence of significant depression and/or stress, maximize coping skills, provide positive support system. Participant is able to verbalize types and ability to use techniques and skills needed for reducing stress and depression.  Initial Review & Psychosocial Screening:     Initial Psych Review & Screening - 10/22/16 1635      Initial Review   Current issues with Current Anxiety/Panic  Pt feels her effexor works well for her     [email or web address]   Comments Pt husband accompanied pt to orientation appointment     Barriers   Psychosocial barriers to participate in program The patient should benefit from training in stress management and relaxation.      Quality of Life Scores:     Quality of Life -  10/22/16 1515      Quality of Life Scores   Health/Function Pre 28.29 %   Socioeconomic Pre 30 %   Psych/Spiritual Pre 29.14 %   Family Pre 30 %   GLOBAL Pre 29.09 %      PHQ-9: Recent Review Flowsheet Data    Depression screen [facility] 2/9 10/28/2016 09/23/2016 11/14/2015 06/03/2014   Decreased Interest 0 0 0 0   Down, Depressed, Hopeless 0 0 0 0   PHQ - 2 Score 0 0 0 0     Interpretation of Total Score  Total Score Depression Severity:  1-4 = Minimal depression, 5-9 = Mild depression, 10-14 = Moderate depression, 15-19 = Moderately severe depression, 20-27 = Severe depression   Psychosocial Evaluation and Intervention:   Psychosocial Re-Evaluation:     Psychosocial Re-Evaluation     Row Name 12/11/16 1742             Psychosocial Re-Evaluation   Current issues with None Identified       Interventions Encouraged to attend Cardiac Rehabilitation for the exercise       Continue Psychosocial Services  No Follow up required          Psychosocial Discharge (Final Psychosocial Re-Evaluation):     Psychosocial Re-Evaluation - 12/11/16 1742      Psychosocial Re-Evaluation   Current issues with None Identified   Interventions Encouraged to attend Cardiac Rehabilitation for the exercise   Continue Psychosocial Services  No Follow up required      Vocational Rehabilitation: Provide vocational rehab assistance to qualifying candidates.   Vocational Rehab Evaluation & Intervention:     Vocational Rehab - 10/22/16 1637      Initial Vocational Rehab Evaluation & Intervention   Assessment shows need for Vocational Rehabilitation No  Pt does not plan to return to competitive employment      Education: Education Goals: Education classes will be provided on a weekly basis, covering required topics. Participant will state understanding/return demonstration of topics presented.  Learning Barriers/Preferences:     Learning Barriers/Preferences - 10/22/16 1520      Learning Barriers/Preferences   Learning Barriers None   Learning Preferences Skilled Demonstration      Education Topics: Count Your Pulse:  -Group instruction provided by verbal instruction, demonstration, patient participation and written materials to support subject.  Instructors address importance of being able to find your pulse and how to count your pulse when at home without a heart monitor.  Patients get hands on experience counting their pulse with staff help and individually.   Heart Attack, Angina, and Risk Factor Modification:  -Group instruction provided by verbal instruction, video, and written materials to support subject.  Instructors address signs and symptoms of angina and heart  attacks.    Also discuss risk factors for heart disease and how to make changes to improve heart health risk factors.   CARDIAC REHAB PHASE II EXERCISE from 11/29/2016 in [place]  Date  11/13/16  Instruction Review Code  2- meets goals/outcomes      Functional Fitness:  -Group instruction provided by verbal instruction, demonstration, patient participation, and written materials to support subject.  Instructors address safety measures for doing things around the house.  Discuss how to get up and down off the floor, how to pick things up properly, how to safely get out of a chair without assistance, and balance training.   CARDIAC REHAB PHASE II EXERCISE from 11/29/2016 in [facility]  CARDIAC REHAB  Date  11/08/16  Instruction Review Code  2- meets goals/outcomes      Meditation and Mindfulness:  -Group instruction provided by verbal instruction, patient participation, and written materials to support subject.  Instructor addresses importance of mindfulness and meditation practice to help reduce stress and improve awareness.  Instructor also leads participants through a meditation exercise.    CARDIAC REHAB PHASE II EXERCISE from 11/29/2016 in [place]  Date  11/27/16  Educator  Jeanella Craze  Instruction Review Code  2- meets goals/outcomes      Stretching for Flexibility and Mobility:  -Group instruction provided by verbal instruction, patient participation, and written materials to support subject.  Instructors lead participants through series of stretches that are designed to increase flexibility thus improving mobility.  These stretches are additional exercise for major muscle groups that are typically performed during regular warm up and cool down.   CARDIAC REHAB PHASE II EXERCISE from 11/29/2016 in [place]  Date  11/15/16  Instruction Review Code  2- meets goals/outcomes       Hands Only CPR:  -Group verbal, video, and participation provides a basic overview of AHA guidelines for community CPR. Role-play of emergencies allow participants the opportunity to practice calling for help and chest compression technique with discussion of AED use.   Hypertension: -Group verbal and written instruction that provides a basic overview of hypertension including the most recent diagnostic guidelines, risk factor reduction with self-care instructions and medication management.   CARDIAC REHAB PHASE II EXERCISE from 11/29/2016 in [place]  Date  11/29/16  Educator  Maurice Small  Instruction Review Code  2- meets goals/outcomes       Nutrition I class: Heart Healthy Eating:  -Group instruction provided by PowerPoint slides, verbal discussion, and written materials to support subject matter. The instructor gives an explanation and review of the Therapeutic Lifestyle Changes diet recommendations, which includes a discussion on lipid goals, dietary fat, sodium, fiber, plant stanol/sterol esters, sugar, and the components of a well-balanced, healthy diet.   CARDIAC REHAB PHASE II EXERCISE from 11/29/2016 in [place]  Date  11/27/16  Educator  RD  Instruction Review Code  Not applicable [Class handouts given]      Nutrition II class: Lifestyle Skills:  -Group instruction provided by PowerPoint slides, verbal discussion, and written materials to support subject matter. The instructor gives an explanation and review of label reading, grocery shopping for heart health, heart healthy recipe modifications, and ways to make healthier choices when eating out.   CARDIAC REHAB PHASE II EXERCISE from 11/29/2016 in [place]  Date  11/27/16  Educator  RD  Instruction Review Code  Not applicable [Class handouts given]      Diabetes Question & Answer:  -Group instruction provided by  PowerPoint slides, verbal discussion, and written materials to support subject matter. The instructor gives an explanation and review of diabetes co-morbidities, pre- and post-prandial blood glucose goals, pre-exercise blood glucose goals, signs, symptoms, and treatment of hypoglycemia and hyperglycemia, and foot care basics.   CARDIAC REHAB PHASE II EXERCISE from 11/29/2016 in [place]  Date  11/01/16  Educator  RD  Instruction Review Code  2- meets goals/outcomes      Diabetes Blitz:  -Group instruction provided by PowerPoint slides, verbal discussion, and written materials to support subject matter. The instructor gives an  explanation and review of the physiology behind type 1 and type 2 diabetes, diabetes medications and rational behind using different medications, pre- and post-prandial blood glucose recommendations and Hemoglobin A1c goals, diabetes diet, and exercise including blood glucose guidelines for exercising safely.    CARDIAC REHAB PHASE II EXERCISE from 11/29/2016 in [place]  Date  11/27/16  Educator  RD  Instruction Review Code  Not applicable [Class handouts given]      Portion Distortion:  -Group instruction provided by PowerPoint slides, verbal discussion, written materials, and food models to support subject matter. The instructor gives an explanation of serving size versus portion size, changes in portions sizes over the last 20 years, and what consists of a serving from each food group.   Stress Management:  -Group instruction provided by verbal instruction, video, and written materials to support subject matter.  Instructors review role of stress in heart disease and how to cope with stress positively.     Exercising on Your Own:  -Group instruction provided by verbal instruction, power point, and written materials to support subject.  Instructors discuss benefits of exercise, components of exercise,  frequency and intensity of exercise, and end points for exercise.  Also discuss use of nitroglycerin and activating EMS.  Review options of places to exercise outside of rehab.  Review guidelines for sex with heart disease.   CARDIAC REHAB PHASE II EXERCISE from 11/29/2016 in [place]  Date  11/06/16  Instruction Review Code  2- meets goals/outcomes      Cardiac Drugs I:  -Group instruction provided by verbal instruction and written materials to support subject.  Instructor reviews cardiac drug classes: antiplatelets, anticoagulants, beta blockers, and statins.  Instructor discusses reasons, side effects, and lifestyle considerations for each drug class.   Cardiac Drugs II:  -Group instruction provided by verbal instruction and written materials to support subject.  Instructor reviews cardiac drug classes: angiotensin converting enzyme inhibitors (ACE-I), angiotensin II receptor blockers (ARBs), nitrates, and calcium channel blockers.  Instructor discusses reasons, side effects, and lifestyle considerations for each drug class.   Anatomy and Physiology of the Circulatory System:  Group verbal and written instruction and models provide basic cardiac anatomy and physiology, with the coronary electrical and arterial systems. Review of: AMI, Angina, Valve disease, Heart Failure, Peripheral Artery Disease, Cardiac Arrhythmia, Pacemakers, and the ICD.   Other Education:  -Group or individual verbal, written, or video instructions that support the educational goals of the cardiac rehab program.   Knowledge Questionnaire Score:     Knowledge Questionnaire Score - 10/22/16 1512      Knowledge Questionnaire Score   Pre Score 19/24      Core Components/Risk Factors/Patient Goals at Admission:     Personal Goals and Risk Factors at Admission - 10/22/16 1445      Core Components/Risk Factors/Patient Goals on Admission    Weight Management Obesity;Yes    Intervention Weight Management/Obesity: Establish reasonable short term and long term weight goals.;Obesity: Provide education and appropriate resources to help participant work on and attain dietary goals.   Admit Weight 224 lb 3.3 oz (101.7 kg)   Goal Weight: Short Term 218 lb 4.8 oz (99 kg)   Goal Weight: Long Term 200 lb 4.8 oz (90.9 kg)   Expected Outcomes Short Term: Continue to assess and modify interventions until short term weight is achieved;Long Term: Adherence to nutrition and physical activity/exercise program aimed toward attainment of established weight goal;Weight Loss:  Understanding of general recommendations for a balanced deficit meal plan, which promotes 1-2 lb weight loss per week and includes a negative energy balance of [phone] kcal/d   Diabetes Yes   Intervention Provide education about signs/symptoms and action to take for hypo/hyperglycemia.;Provide education about proper nutrition, including hydration, and aerobic/resistive exercise prescription along with prescribed medications to achieve blood glucose in normal ranges: Fasting glucose 65-99 mg/dL   Expected Outcomes Short Term: Participant verbalizes understanding of the signs/symptoms and immediate care of hyper/hypoglycemia, proper foot care and importance of medication, aerobic/resistive exercise and nutrition plan for blood glucose control.;Long Term: Attainment of HbA1C < 7%.   Hypertension Yes   Intervention Provide education on lifestyle modifcations including regular physical activity/exercise, weight management, moderate sodium restriction and increased consumption of fresh fruit, vegetables, and low fat dairy, alcohol moderation, and smoking cessation.;Monitor prescription use compliance.   Expected Outcomes Short Term: Continued assessment and intervention until BP is < 140/66m HG in hypertensive participants. < 130/83mHG in hypertensive participants with diabetes, heart failure or chronic kidney disease.;Long  Term: Maintenance of blood pressure at goal levels.   Lipids Yes   Intervention Provide education and support for participant on nutrition & aerobic/resistive exercise along with prescribed medications to achieve LDL <7054mHDL >18m72m Expected Outcomes Short Term: Participant states understanding of desired cholesterol values and is compliant with medications prescribed. Participant is following exercise prescription and nutrition guidelines.;Long Term: Cholesterol controlled with medications as prescribed, with individualized exercise RX and with personalized nutrition plan. Value goals: LDL < 70mg11mL > 40 mg.   Personal Goal Other Yes   Personal Goal Get back to moving/exercise routine. Have energy like prior to surgery. Long term goal: decrease BMI from obese category to overweight category.   Intervention Provide individualized exercise program including stretching, resistance training and aerobic exercise guidelines with safe parameters for exercise.   Expected Outcomes Resume home exercise routine in addition to exercise at cardiac rehab based on guidelines provided. Achieve safe and appropriate short term and long term weight loss goals. Increased energy level based on functional fitness tests and personal statement of partiicipant.      Core Components/Risk Factors/Patient Goals Review:    Core Components/Risk Factors/Patient Goals at Discharge (Final Review):    ITP Comments:     ITP Comments    Row Name 10/22/16 1348           ITP Comments Medical Director:  Dr. TraciFransico Him      Comments: MargiLesleigh Noeaking expected progress toward personal goals after completing 15 sessions. Recommend continued exercise and life style modification education including  stress management and relaxation techniques to decrease cardiac risk profile. MargiLesleigh Noeraduating early on Friday so that she can tend to other commitments.MariaBarnet PallBSN 12/11/2016 5:46 PM

## 2016-12-13 ENCOUNTER — Encounter (HOSPITAL_COMMUNITY): Admission: RE | Admit: 2016-12-13 | Payer: Medicare Other | Source: Ambulatory Visit

## 2016-12-13 ENCOUNTER — Encounter (HOSPITAL_COMMUNITY): Payer: Medicare Other

## 2016-12-13 NOTE — Progress Notes (Signed)
Discharge Summary  Patient Details  Name: [name] MRN: [id number] Date of Birth: 10/02/1950 Referring Provider:     CARDIAC REHAB PHASE II ORIENTATION from 10/22/2016 in [place]  Referring Provider  Ottie Glazier, MD.       Number of Visits: 15  Reason for Discharge:  Early Exit:  Lesleigh Noe is having a minor surgery on her leg will have stiches and not be able to exercise for a few weeks  Smoking History:  History  Smoking Status  . Never Smoker  Smokeless Tobacco  . Never Used    Diagnosis:  08/23/16 S/P AVR (aortic valve replacement)  ADL UCSD:   Initial Exercise Prescription:     Initial Exercise Prescription - 10/22/16 1400      Date of Initial Exercise RX and Referring Provider   Date 10/22/16   Referring Provider Ottie Glazier, MD.     Recumbant Bike   Level 3   Watts 30   Minutes 10   METs 2.94     NuStep   Level 3   SPM 85   Minutes 10   METs 2.5     Track   Laps 11   Minutes 10   METs 2.92     Prescription Details   Frequency (times per week) 3   Duration Progress to 30 minutes of continuous aerobic without signs/symptoms of physical distress     Intensity   THRR 40-80% of Max Heartrate 62-124   Ratings of Perceived Exertion 11-13   Perceived Dyspnea 0-4     Progression   Progression Continue to progress workloads to maintain intensity without signs/symptoms of physical distress.     Resistance Training   Training Prescription Yes   Weight 3lbs.   Reps 10-15      Discharge Exercise Prescription (Final Exercise Prescription Changes):     Exercise Prescription Changes - 12/19/16 1100      Response to Exercise   Blood Pressure (Admit) 106/60   Blood Pressure (Exercise) 128/70   Blood Pressure (Exit) 112/78   Heart Rate (Admit) 79 bpm   Heart Rate (Exercise) 116 bpm   Heart Rate (Exit) 72 bpm   Rating of Perceived Exertion (Exercise) 12   Duration Progress to 45 minutes of aerobic  exercise without signs/symptoms of physical distress   Intensity THRR unchanged     Progression   Progression Continue to progress workloads to maintain intensity without signs/symptoms of physical distress.   Average METs 3.7     Resistance Training   Training Prescription Yes   Weight 3lbs.   Reps 10-15     Recumbant Bike   Level 3   Watts 30   Minutes 10   METs 2.2     NuStep   Level 4   SPM 85   Minutes 10   METs 2.4     Track   Laps 13   Minutes 10   METs 3.26     Home Exercise Plan   Plans to continue exercise at Home (comment)   Frequency Add 3 additional days to program exercise sessions.      Functional Capacity:     6 Minute Walk    Row Name 10/22/16 1518         6 Minute Walk   Phase Initial     Distance 1374 feet     Walk Time 6 minutes     # of Rest Breaks 0  MPH 2.6     METS 2.97     RPE 11     VO2 Peak 10.4     Symptoms No     Resting HR 65 bpm     Resting BP 103/45     Max Ex. HR 112 bpm     Max Ex. BP 142/78     2 Minute Post BP 138/80        Psychological, QOL, Others - Outcomes: PHQ 2/9: Depression screen [facility] 2/9 01/09/2017 10/28/2016 09/23/2016 11/14/2015 06/03/2014  Decreased Interest 0 0 0 0 0  Down, Depressed, Hopeless 0 0 0 0 0  PHQ - 2 Score 0 0 0 0 0  Some recent data might be hidden    Quality of Life:     Quality of Life - 10/22/16 1515      Quality of Life Scores   Health/Function Pre 28.29 %   Socioeconomic Pre 30 %   Psych/Spiritual Pre 29.14 %   Family Pre 30 %   GLOBAL Pre 29.09 %      Personal Goals: Goals established at orientation with interventions provided to work toward goal.     Personal Goals and Risk Factors at Admission - 10/22/16 1445      Core Components/Risk Factors/Patient Goals on Admission    Weight Management Obesity;Yes   Intervention Weight Management/Obesity: Establish reasonable short term and long term weight goals.;Obesity: Provide education and appropriate resources to  help participant work on and attain dietary goals.   Admit Weight 224 lb 3.3 oz (101.7 kg)   Goal Weight: Short Term 218 lb 4.8 oz (99 kg)   Goal Weight: Long Term 200 lb 4.8 oz (90.9 kg)   Expected Outcomes Short Term: Continue to assess and modify interventions until short term weight is achieved;Long Term: Adherence to nutrition and physical activity/exercise program aimed toward attainment of established weight goal;Weight Loss: Understanding of general recommendations for a balanced deficit meal plan, which promotes 1-2 lb weight loss per week and includes a negative energy balance of [phone] kcal/d   Diabetes Yes   Intervention Provide education about signs/symptoms and action to take for hypo/hyperglycemia.;Provide education about proper nutrition, including hydration, and aerobic/resistive exercise prescription along with prescribed medications to achieve blood glucose in normal ranges: Fasting glucose 65-99 mg/dL   Expected Outcomes Short Term: Participant verbalizes understanding of the signs/symptoms and immediate care of hyper/hypoglycemia, proper foot care and importance of medication, aerobic/resistive exercise and nutrition plan for blood glucose control.;Long Term: Attainment of HbA1C < 7%.   Hypertension Yes   Intervention Provide education on lifestyle modifcations including regular physical activity/exercise, weight management, moderate sodium restriction and increased consumption of fresh fruit, vegetables, and low fat dairy, alcohol moderation, and smoking cessation.;Monitor prescription use compliance.   Expected Outcomes Short Term: Continued assessment and intervention until BP is < 140/73mm HG in hypertensive participants. < 130/79mm HG in hypertensive participants with diabetes, heart failure or chronic kidney disease.;Long Term: Maintenance of blood pressure at goal levels.   Lipids Yes   Intervention Provide education and support for participant on nutrition &  aerobic/resistive exercise along with prescribed medications to achieve LDL 70mg , HDL >40mg .   Expected Outcomes Short Term: Participant states understanding of desired cholesterol values and is compliant with medications prescribed. Participant is following exercise prescription and nutrition guidelines.;Long Term: Cholesterol controlled with medications as prescribed, with individualized exercise RX and with personalized nutrition plan. Value goals: LDL < 70mg , HDL > 40 mg.   Personal Goal  Other Yes   Personal Goal Get back to moving/exercise routine. Have energy like prior to surgery. Long term goal: decrease BMI from obese category to overweight category.   Intervention Provide individualized exercise program including stretching, resistance training and aerobic exercise guidelines with safe parameters for exercise.   Expected Outcomes Resume home exercise routine in addition to exercise at cardiac rehab based on guidelines provided. Achieve safe and appropriate short term and long term weight loss goals. Increased energy level based on functional fitness tests and personal statement of partiicipant.       Personal Goals Discharge:   Nutrition & Weight - Outcomes:     Pre Biometrics - 10/22/16 1453      Pre Biometrics   Height 5' 5.5" (1.664 m)   Weight 224 lb 3.3 oz (101.7 kg)   Waist Circumference 44.25 inches   Hip Circumference 51 inches   Waist to Hip Ratio 0.87 %   BMI (Calculated) 36.8   Triceps Skinfold 40 mm   % Body Fat 48.5 %   Grip Strength 33.5 kg   Flexibility 7.5 in   Single Leg Stand 2.53 seconds         Post Biometrics - 12/19/16 1157       Post  Biometrics   Weight 227 lb 8.2 oz (103.2 kg)      Nutrition:     Nutrition Therapy & Goals - 11/27/16 1553      Nutrition Therapy   Diet Carb Modified, Therapeutic Lifestyle Changes     Personal Nutrition Goals   Nutrition Goal Wt loss of 1-2 lb/week to a wt loss goal of 6-24 lb at graduation from  [place], educate and counsel regarding individualized specific dietary modifications aiming towards targeted core components such as weight, hypertension, lipid management, diabetes, heart failure and other comorbidities.   Expected Outcomes Short Term Goal: Understand basic principles of dietary content, such as calories, fat, sodium, cholesterol and nutrients.;Long Term Goal: Adherence to prescribed nutrition plan.      Nutrition Discharge:     Nutrition Assessments - 12/27/16 1354      MEDFICTS Scores   Pre Score 6   Post Score --  post survey not received      Education Questionnaire Score:     Knowledge Questionnaire Score - 10/22/16 1512      Knowledge Questionnaire Score   Pre Score 19/24      Margie attended 15 exercise sessions and did well with exercise and managed her blood sugars. Margie finished early so that she can have minor surgery on her leg. Margie said she will not be able to exercise for a few weeks after that. Margie plans to continue exercise on her own.Barnet Pall, RN,BSN 01/09/2017 11:31 AM

## 2016-12-15 ENCOUNTER — Other Ambulatory Visit: Payer: Self-pay | Admitting: Family Medicine

## 2016-12-16 ENCOUNTER — Encounter (HOSPITAL_COMMUNITY): Payer: Medicare Other

## 2016-12-16 ENCOUNTER — Encounter (HOSPITAL_COMMUNITY)
Admission: RE | Admit: 2016-12-16 | Discharge: 2016-12-16 | Disposition: A | Discharge status: Home / Self Care | Payer: Medicare Other | Source: Ambulatory Visit | Attending: Cardiology | Admitting: Cardiology

## 2016-12-16 DIAGNOSIS — Z952 Presence of prosthetic heart valve: Secondary | ICD-10-CM

## 2016-12-18 ENCOUNTER — Encounter (HOSPITAL_COMMUNITY): Payer: Medicare Other

## 2016-12-18 NOTE — Progress Notes (Signed)
Cardiology Office Note:    Date:  12/19/2016   ID:  [name], DOB 05/08/1951, MRN [id number]  PCP:  [name], MD  Cardiologist:  Dr Meda Coffee  Referring MD: [name], MD   Chief Complaint  Patient presents with  . Follow-up    S/P AVR    History of Present Illness:    [name] is a 66 y.o. female with a hx of Diabetes, hyperlipidemia, OSA on CPAP, and aortic valve replacement with bioprosthetic bovine pericardial tissue valve 08/23/16. Patient had only minor nonobstructive CAD on heart catheterization 08/02/16 done in preparation for AVR. Also right heart cath showed normal right heart hemodynamics. Echocardiogram showed normal EF of 60-65% and grade 1 diastolic dysfunction.  Pt is feeling very well and much better after the valve replacement than before. No chest pain or tightness except for mild soreness at surgical site. No shortness of breath, DOE, orthopnea, or edema.   Completed 8 weeks of cardiac rehab and working out at [employer] (has taken a week off due to having a mole on her leg removed). She is doing cardio exercises with 10 minutes walking, 10 minutes on bike and 10 minutes on Nu Step and doing some light weights.   She uses her CPAP every night.   Past Medical History:  Diagnosis Date  . Allergy   . Anxiety   . Arthritis    back & knee  . Asthma     mild per pt shows up with resp illness  . Chronic diastolic congestive heart failure ([place])   . Colon polyps   . Diabetes mellitus without complication ([place])   . Gastric polyps   . Gastroparesis   . GERD (gastroesophageal reflux disease)   . Headache    sinus headaches and migraines at times  . Heart murmur   . History of migraine headaches   . Hyperlipidemia   . IBS (irritable bowel syndrome)   . Lumbar disc disease   . S/P aortic valve replacement with bioprosthetic valve 08/23/2016   25 mm Edwards Intuity rapid-deployment bovine pericardial tissue valve via partial upper  mini sternotomy  . Sleep apnea     Past Surgical History:  Procedure Laterality Date  . ABDOMINAL HYSTERECTOMY    . AORTIC VALVE REPLACEMENT N/A 08/23/2016   Procedure: AORTIC VALVE REPLACEMENT (AVR) - using partial Upper Sternotomy- 71m Edwards Intuity Aortic Valve used;  Surgeon: CRexene Alberts MD;  [facility]  Service: Open Heart Surgery;  Laterality: N/A;  . BLADDER SUSPENSION  2007  . BREAST ENHANCEMENT SURGERY  1980  . CARPAL TUNNEL RELEASE Bilateral   . COLONOSCOPY    . DORSAL COMPARTMENT RELEASE Right 09/15/2014   Procedure: RIGHT WRIST DEQUERVAINS RELEASE ;  Surgeon: DKathryne Hitch MD;  [facility]  Service: Orthopedics;  Laterality: Right;  . ESOPHAGOGASTRODUODENOSCOPY    . KNEE ARTHROSCOPY  04/15/2012   Procedure: ARTHROSCOPY KNEE;  Surgeon: DNinetta Lights MD;  [facility]  Service: Orthopedics;  Laterality: Right;  . LAPAROSCOPIC CHOLECYSTECTOMY    . LASIK    . OOPHORECTOMY    . PALATE TO GINGIVA GRAFT  2017  . RIGHT/LEFT HEART CATH AND CORONARY ANGIOGRAPHY N/A 08/02/2016   Procedure: Right/Left Heart Cath and Coronary Angiography;  Surgeon: MSherren Mocha MD;  [facility];  Service: Cardiovascular;  Laterality: N/A;  . ROTATOR CUFF REPAIR Left   . TEE WITHOUT CARDIOVERSION N/A 08/23/2016   Procedure: TRANSESOPHAGEAL  ECHOCARDIOGRAM (TEE);  Surgeon: Rexene Alberts, MD;  Location: [facility];  Service: Open Heart Surgery;  Laterality: N/A;  . TOTAL KNEE ARTHROPLASTY  04/15/2012   Procedure: TOTAL KNEE ARTHROPLASTY;  Surgeon: [name], MD;  Location: [facility];  Service: Orthopedics;  Laterality: Right;  . TRIGGER FINGER RELEASE Right 04/20/2015   Procedure: RIGHT TRIGGER FINGER RELEASE (TENDON SHEALTH INCISION) ;  Surgeon: [name], MD;  Location: [facility];  Service: Orthopedics;  Laterality: Right;    Current Medications: Current Meds  Medication Sig  . aspirin EC 325 MG EC tablet Take 1 tablet  (325 mg total) by mouth daily.  [name] atorvastatin (LIPITOR) 40 MG tablet TAKE 1 TABLET BY MOUTH AT NIGHT ON TUESDAY AND FRIDAY  . Biotin 5000 MCG TABS Take 5,000 mcg by mouth daily.   . Blood Glucose Monitoring Suppl (ONETOUCH VERIO) W/DEVICE KIT 1 kit by Does not apply route once.  . calcium-vitamin D (OSCAL WITH D) 500-200 MG-UNIT per tablet Take 2 tablets by mouth daily.   . clindamycin (CLEOCIN) 300 MG capsule Take 2 capsules (600 mg total) by mouth 2 hours prior too dental cleaning.  . fluticasone (FLONASE) 50 MCG/ACT nasal spray Place 2 sprays into both nostrils daily.   . furosemide (LASIX) 40 MG tablet Take 1 tablet (40 mg total) by mouth daily.  [name] glucose blood (ONETOUCH VERIO) test strip Use to test blood sugars daily. Dx: E11.9  . ipratropium (ATROVENT) 0.03 % nasal spray Place 2 sprays into both nostrils 2 (two) times daily.   . Lancets (ONETOUCH ULTRASOFT) lancets Use to test blood sugars daily. Dx: E11.9  . loratadine (CLARITIN) 10 MG tablet Take 10 mg by mouth at bedtime.   . metFORMIN (GLUCOPHAGE) 500 MG tablet TAKE 1 TABLET WITH BREAKFAST AND 1/2 TO 1 TABLET WITH DINNER.  [name] metoCLOPramide (REGLAN) 10 MG tablet TAKE 1/2 TABLET BY MOUTH 4 TIMES A DAY (Patient taking differently: TAKE 1/2 TABLET (5 MG) BY MOUTH 4 TIMES A DAY)  . metoprolol tartrate (LOPRESSOR) 25 MG tablet Take 0.5 tablets (12.5 mg total) by mouth 2 (two) times daily.  . Multiple Vitamin (MULTIVITAMIN WITH MINERALS) TABS tablet Take 1 tablet by mouth daily.  . nabumetone (RELAFEN) 750 MG tablet TAKE 2 TABLETS BY MOUTH EVERY DAY  . omeprazole (PRILOSEC OTC) 20 MG tablet Take 40 mg by mouth daily with breakfast.  . potassium chloride SA (K-DUR,KLOR-CON) 20 MEQ tablet Take 1 tablet (20 mEq total) by mouth daily.  . Probiotic Product (ALIGN) 4 MG CAPS Take 4 mg by mouth daily.   . psyllium (METAMUCIL SMOOTH TEXTURE) 28 % packet Take 1 packet by mouth daily before breakfast.   . SUMAtriptan (IMITREX) 100 MG tablet TAKE 1  TABLET BY MOUTH EVERY 2 HOURS AS NEEDED FOR MIGRAINE (Patient taking differently: Take 100 mg by mouth every 2 (two) hours as needed. TAKE 1 TABLET BY MOUTH EVERY 2 HOURS AS NEEDED FOR MIGRAINE. DO NOT EXCEED 2 DOSES IN 24 HOURS.)  . venlafaxine XR (EFFEXOR-XR) 150 MG 24 hr capsule TAKE ONE CAPSULE BY MOUTH DAILY WITH BREAKFAST     Allergies:   Erythromycin and Penicillins   Social History   Social History  . Marital status: Married    Spouse name: N/A  . Number of children: 1  . Years of education: N/A   Occupational History  . Retired Retired   Social History Main Topics  . Smoking status: Never Smoker  . Smokeless tobacco: Never  Used  . Alcohol use No  . Drug use: No  . Sexual activity: Yes   Other Topics Concern  . None   Social History Narrative   She lives with husband (1978) and two dogs. Step-son [name] (1971-psychiatrist in [place]). No grandkids. 2 dogs-sheltie/collie mix and border collie mix      Highest level of education:  Master in education   She is retired Animal nutritionist x 32 years.      Hobbies: time with dogs              Family History: The patient's family history includes Alcohol abuse in her father; COPD in her mother; Colon polyps in her maternal aunt and mother; Heart disease in her father; Irritable bowel syndrome in her mother. ROS:   Please see the history of present illness.     All other systems reviewed and are negative.  EKGs/Labs/Other Studies Reviewed:    The following studies were reviewed today:  Right/Left Heart Cath and Coronary Angiography 08/02/16   1. Minor nonobstructive CAD 2. Normal right heart hemodynamics (including normal PCWP) 3. Known severe aortic stenosis    Intraoperative TEE 08/23/16  Left ventricle: Normal cavity size. Concentric hypertrophy of moderate severity. LV systolic function is normal with an EF of 60-65%. There are no obvious wall motion abnormalities.  Septum: No Patent Foramen Ovale  present.  Left atrium: Patent foramen ovale not present.  Aortic valve: The valve is bicuspid. Severe valve thickening present. Severe valve calcification present. Moderately decreased leaflet separation. Severe stenosis. No regurgitation. No AV vegetation. No evidence of papillary fibroelastoma.  Aorta: The ascending aorta is mildly dilated.  Mitral valve: No leaflet thickening and calcification present. Mild mitral annular calcification.  Right ventricle: Normal cavity size, wall thickness and ejection fraction.  Tricuspid valve: Trace regurgitation. The tricuspid valve regurgitation jet is central.   EKG:  EKG is not ordered today.    Recent Labs: 08/24/2016: Magnesium 2.0 09/16/2016: ALT 11; BUN 17; Creatinine, Ser 0.67; Hemoglobin 11.1; NT-Pro BNP 324; Platelets 464; Potassium 4.7; Sodium 136; TSH 3.340   Recent Lipid Panel    Component Value Date/Time   CHOL 148 05/14/2016 0818   TRIG 107.0 05/14/2016 0818   HDL 54.40 05/14/2016 0818   CHOLHDL 3 05/14/2016 0818   VLDL 21.4 05/14/2016 0818   LDLCALC 72 05/14/2016 0818   LDLDIRECT 84.0 11/09/2014 1117    Physical Exam:    VS:  BP 106/60   Pulse 61   Ht 5' 5.5" (1.664 m)   Wt 227 lb 12.8 oz (103.3 kg)   SpO2 99%   BMI 37.33 kg/m     Wt Readings from Last 3 Encounters:  12/19/16 227 lb 12.8 oz (103.3 kg)  12/19/16 227 lb 8.2 oz (103.2 kg)  11/11/16 226 lb (102.5 kg)     GEN: Well nourished, well developed in no acute distress HEENT: Normal NECK: No JVD; No carotid bruits LYMPHATICS: No lymphadenopathy CARDIAC: RRR, no murmurs, rubs, gallops RESPIRATORY:  Clear to auscultation without rales, wheezing or rhonchi  ABDOMEN: Soft, non-tender, non-distended MUSCULOSKELETAL:  No edema; No deformity  SKIN: Warm and dry NEUROLOGIC:  Alert and oriented x 3 PSYCHIATRIC:  Normal affect   ASSESSMENT:    1. Chronic diastolic congestive heart failure ([place])   2. S/P minimally invasive aortic valve replacement with  bioprosthetic valve   3. Mixed hyperlipidemia   4. OSA (obstructive sleep apnea)    PLAN:    In order of problems  listed above:  Chronic diastolic CHF -Likely related to valvular disease. Has only minor nonobstructive CAD and normal heart pressures per R&L heart cath in 07/2016 in prep for surgery.    -Post op appointment in 08/2016 BNP was 324. -No current DOE, orthopnea, edema or chest discomfort.  -Continue current medical therapy including lasix 40 mg daily, potassium  S/P AVR with bovine tissue valve 08/23/16 - Has completed 8 weeks of cardiac rehab and exercises regularly at the [place] [facility] without exertional symptoms.  -Pt had a mole removed from her leg last week. No infection. Reviewed that she would need antibiotic for any skin intervention involving infection.   Hyperlipidemia -Last lipid panel on 05/14/16 showed LDL 72 -Conitnue atorvastatin 40 mg daily  OSA -Using CPAP consistently every night.   Diabetes mellitus -Last A1c 6.6 on 08/21/16, management per PCP   Medication Adjustments/Labs and Tests Ordered: Current medicines are reviewed at length with the patient today.  Concerns regarding medicines are outlined above. Labs and tests ordered and medication changes are outlined in the patient instructions below:  Patient Instructions     If you need a refill on your cardiac medications before your next appointment, please call your pharmacy.   Signed, Daune Perch, NP  12/19/2016 4:16 PM    [place]

## 2016-12-19 ENCOUNTER — Ambulatory Visit: Payer: Medicare Other | Admitting: Cardiology

## 2016-12-19 ENCOUNTER — Encounter: Payer: Self-pay | Admitting: Cardiology

## 2016-12-19 ENCOUNTER — Ambulatory Visit (INDEPENDENT_AMBULATORY_CARE_PROVIDER_SITE_OTHER): Payer: Medicare Other | Admitting: Cardiology

## 2016-12-19 VITALS — BP 106/60 | HR 61 | Ht 65.5 in | Wt 227.8 lb

## 2016-12-19 DIAGNOSIS — E782 Mixed hyperlipidemia: Secondary | ICD-10-CM

## 2016-12-19 DIAGNOSIS — Z953 Presence of xenogenic heart valve: Secondary | ICD-10-CM | POA: Diagnosis not present

## 2016-12-19 DIAGNOSIS — I5032 Chronic diastolic (congestive) heart failure: Secondary | ICD-10-CM

## 2016-12-19 DIAGNOSIS — G4733 Obstructive sleep apnea (adult) (pediatric): Secondary | ICD-10-CM

## 2016-12-19 NOTE — Patient Instructions (Addendum)
  Medication Instructions:  Your physician recommends that you continue on your current medications as directed. Please refer to the Current Medication list given to you today.   Labwork: NONE ORDERED  Testing/Procedures: NONE ORDERED  Follow-Up: Your physician recommends that you schedule a follow-up appointment in: 4 MONTHS WITH DR. Meda Coffee    Any Other Special Instructions Will Be Listed Below (If Applicable).     If you need a refill on your cardiac medications before your next appointment, please call your pharmacy.

## 2016-12-20 ENCOUNTER — Encounter (HOSPITAL_COMMUNITY): Payer: Medicare Other

## 2016-12-23 ENCOUNTER — Encounter (HOSPITAL_COMMUNITY): Payer: Medicare Other

## 2016-12-27 ENCOUNTER — Encounter (HOSPITAL_COMMUNITY): Payer: Medicare Other

## 2016-12-27 NOTE — Addendum Note (Signed)
Encounter addended by: Jewel Baize, RD on: 12/27/2016  1:57 PM<BR>    Actions taken: Flowsheet data copied forward, Visit Navigator Flowsheet section accepted

## 2016-12-30 ENCOUNTER — Encounter (HOSPITAL_COMMUNITY): Payer: Medicare Other

## 2016-12-31 ENCOUNTER — Other Ambulatory Visit: Payer: Self-pay | Admitting: Family Medicine

## 2017-01-01 ENCOUNTER — Encounter (HOSPITAL_COMMUNITY): Payer: Medicare Other

## 2017-01-02 ENCOUNTER — Encounter: Payer: Self-pay | Admitting: Family Medicine

## 2017-01-02 ENCOUNTER — Ambulatory Visit (INDEPENDENT_AMBULATORY_CARE_PROVIDER_SITE_OTHER): Payer: Medicare Other | Admitting: Family Medicine

## 2017-01-02 VITALS — BP 124/62 | HR 64 | Temp 99.2°F | Ht 65.5 in | Wt 226.6 lb

## 2017-01-02 DIAGNOSIS — E782 Mixed hyperlipidemia: Secondary | ICD-10-CM | POA: Diagnosis not present

## 2017-01-02 DIAGNOSIS — E119 Type 2 diabetes mellitus without complications: Secondary | ICD-10-CM

## 2017-01-02 DIAGNOSIS — I10 Essential (primary) hypertension: Secondary | ICD-10-CM | POA: Diagnosis not present

## 2017-01-02 DIAGNOSIS — E1159 Type 2 diabetes mellitus with other circulatory complications: Secondary | ICD-10-CM | POA: Insufficient documentation

## 2017-01-02 DIAGNOSIS — I152 Hypertension secondary to endocrine disorders: Secondary | ICD-10-CM | POA: Insufficient documentation

## 2017-01-02 LAB — HEMOGLOBIN A1C: Hgb A1c MFr Bld: 6.7 % — ABNORMAL HIGH (ref 4.6–6.5)

## 2017-01-02 LAB — BASIC METABOLIC PANEL
BUN: 16 mg/dL (ref 6–23)
CO2: 26 mEq/L (ref 19–32)
Calcium: 9.4 mg/dL (ref 8.4–10.5)
Chloride: 96 mEq/L (ref 96–112)
Creatinine, Ser: 0.68 mg/dL (ref 0.40–1.20)
GFR: 91.98 mL/min (ref >60.00)
Glucose, Bld: 105 mg/dL — ABNORMAL HIGH (ref 70–99)
Potassium: 4.3 mEq/L (ref 3.5–5.1)
Sodium: 132 mEq/L — ABNORMAL LOW (ref 135–145)

## 2017-01-02 LAB — CBC
HCT: 33.8 % — ABNORMAL LOW (ref 36.0–46.0)
Hemoglobin: 10.9 g/dL — ABNORMAL LOW (ref 12.0–15.0)
MCHC: 32.3 g/dL (ref 30.0–36.0)
MCV: 76.1 fl — ABNORMAL LOW (ref 78.0–100.0)
Platelets: 374 10*3/uL (ref 150.0–400.0)
RBC: 4.44 Mil/uL (ref 3.87–5.11)
RDW: 15.2 % (ref 11.5–15.5)
WBC: 8 10*3/uL (ref 4.0–10.5)

## 2017-01-02 NOTE — Patient Instructions (Addendum)
Please stop by lab before you go  Glad you are back to exercise- hopefully that will help you continue weight loss efforts Wt Readings from Last 3 Encounters:  01/02/17 226 lb 9.6 oz (102.8 kg)  12/19/16 227 lb 12.8 oz (103.3 kg)  12/19/16 227 lb 8.2 oz (103.2 kg)   Physical 05/21/17 or later

## 2017-01-02 NOTE — Progress Notes (Signed)
Subjective:  [name] is a 66 y.o. year old very pleasant female patient who presents for/with See problem oriented charting ROS- No chest pain or shortness of breath. No headache or blurry vision.    Past Medical History-  Patient Active Problem List   Diagnosis Date Noted  . S/P minimally invasive aortic valve replacement with bioprosthetic valve 08/23/2016    Priority: High  . Chronic diastolic congestive heart failure (HCC)     Priority: High  . Diabetes mellitus type 2, controlled (HCC) 05/12/2014    Priority: High  . Osteopenia 05/27/2016    Priority: Medium  . OSA (obstructive sleep apnea) 02/27/2016    Priority: Medium  . Elevated alkaline phosphatase level 05/16/2015    Priority: Medium  . GERD (gastroesophageal reflux disease) 03/10/2014    Priority: Medium  . Generalized anxiety disorder 03/10/2014    Priority: Medium  . Migraines 03/10/2014    Priority: Medium  . Irritable bowel syndrome 12/15/2008    Priority: Medium  . OBESITY, MORBID 10/12/2008    Priority: Medium  . Hyperlipidemia 02/09/2008    Priority: Medium  . Bruxism 10/23/2015    Priority: Low  . Atypical chest pain 11/10/2014    Priority: Low  . Acne 05/12/2014    Priority: Low  . EUSTACHIAN TUBE DYSFUNCTION, CHRONIC 06/14/2010    Priority: Low  . Low back pain 08/11/2008    Priority: Low  . Allergic rhinitis 02/12/2007    Priority: Low  . ASTHMA 02/12/2007    Priority: Low  . Hypertension 01/02/2017  . Severe aortic stenosis 07/08/2016    Medications- reviewed and updated Current Outpatient Prescriptions  Medication Sig Dispense Refill  . aspirin EC 325 MG EC tablet Take 1 tablet (325 mg total) by mouth daily. 30 tablet 0  . atorvastatin (LIPITOR) 40 MG tablet TAKE 1 TABLET BY MOUTH AT NIGHT ON TUESDAY AND FRIDAY 30 tablet 4  . Biotin 5000 MCG TABS Take 5,000 mcg by mouth daily.     . Blood Glucose Monitoring Suppl (ONETOUCH VERIO) W/DEVICE KIT 1 kit by Does not apply route  once. 1 kit 0  . calcium-vitamin D (OSCAL WITH D) 500-200 MG-UNIT per tablet Take 2 tablets by mouth daily.     . clindamycin (CLEOCIN) 300 MG capsule Take 2 capsules (600 mg total) by mouth 2 hours prior too dental cleaning. 2 capsule 0  . fluticasone (FLONASE) 50 MCG/ACT nasal spray Place 2 sprays into both nostrils daily.     . furosemide (LASIX) 40 MG tablet Take 1 tablet (40 mg total) by mouth daily. 60 tablet 11  . glucose blood (ONETOUCH VERIO) test strip Use to test blood sugars daily. Dx: E11.9 100 each 12  . ipratropium (ATROVENT) 0.03 % nasal spray Place 2 sprays into both nostrils 2 (two) times daily.   3  . Lancets (ONETOUCH ULTRASOFT) lancets Use to test blood sugars daily. Dx: E11.9 100 each 12  . loratadine (CLARITIN) 10 MG tablet Take 10 mg by mouth at bedtime.     . metFORMIN (GLUCOPHAGE) 500 MG tablet TAKE 1 TABLET WITH BREAKFAST AND 1/2 TO 1 TABLET WITH DINNER. 180 tablet 1  . metoCLOPramide (REGLAN) 10 MG tablet TAKE 1/2 TABLET BY MOUTH 4 TIMES A DAY 60 tablet 5  . metoprolol tartrate (LOPRESSOR) 25 MG tablet Take 0.5 tablets (12.5 mg total) by mouth 2 (two) times daily. 60 tablet 11  . Multiple Vitamin (MULTIVITAMIN WITH MINERALS) TABS tablet Take 1 tablet by mouth daily.    .   mupirocin ointment (BACTROBAN) 2 % Apply 2 application topically 2 (two) times daily as needed.  0  . nabumetone (RELAFEN) 750 MG tablet TAKE 2 TABLETS BY MOUTH EVERY DAY 60 tablet 5  . omeprazole (PRILOSEC OTC) 20 MG tablet Take 40 mg by mouth daily with breakfast.    . potassium chloride SA (K-DUR,KLOR-CON) 20 MEQ tablet Take 1 tablet (20 mEq total) by mouth daily. 60 tablet 11  . Probiotic Product (ALIGN) 4 MG CAPS Take 4 mg by mouth daily.     . psyllium (METAMUCIL SMOOTH TEXTURE) 28 % packet Take 1 packet by mouth daily before breakfast.     . SUMAtriptan (IMITREX) 100 MG tablet TAKE 1 TABLET BY MOUTH EVERY 2 HOURS AS NEEDED FOR MIGRAINE (Patient taking differently: Take 100 mg by mouth every 2  (two) hours as needed. TAKE 1 TABLET BY MOUTH EVERY 2 HOURS AS NEEDED FOR MIGRAINE. DO NOT EXCEED 2 DOSES IN 24 HOURS.) 10 tablet 3  . venlafaxine XR (EFFEXOR-XR) 150 MG 24 hr capsule TAKE ONE CAPSULE BY MOUTH DAILY WITH BREAKFAST 90 capsule 1   No current facility-administered medications for this visit.     Objective: BP 124/62 (BP Location: Left Arm, Patient Position: Sitting, Cuff Size: Large)   Pulse 64   Temp 99.2 F (37.3 C) (Oral)   Ht 5' 5.5" (1.664 m)   Wt 226 lb 9.6 oz (102.8 kg)   SpO2 97%   BMI 37.13 kg/m  Gen: NAD, resting comfortably CV: RRR no murmurs rubs or gallops Lungs: CTAB no crackles, wheeze, rhonchi Abdomen: soft/nontender/nondistended/normal bowel sounds. obese Ext: no edema Skin: warm, dry Neuro: grossly normal, moves all extremities  Assessment/Plan:  Diabetes mellitus type 2, controlled ([place]) S: well controlled. On metformin 54m BID. Weight has stabilized largely- had been losing. Back at gym after skin procedure  Lab Results  Component Value Date   HGBA1C 6.6 (H) 08/21/2016   HGBA1C 6.6 (H) 05/14/2016   HGBA1C 7.2 (H) 11/14/2015   A/P: update a1c today   Hyperlipidemia S: reasonably controlled on atorvastatin 460mTuesday and Friday with last LDL 72. No myalgias.  Lab Results  Component Value Date   CHOL 148 05/14/2016   HDL 54.40 05/14/2016   LDLCALC 72 05/14/2016   LDLDIRECT 84.0 11/09/2014   TRIG 107.0 05/14/2016   CHOLHDL 3 05/14/2016   A/P: continue  meds- update lipids at physical. Continue weight loss efforts  Hypertension S: controlled on metoprolol 12.22m54mID and lasix 22m1mily ASCVD 10 year risk calculation if age 30-774-79 statin BP Readings from Last 3 Encounters:  01/02/17 124/62  12/19/16 106/60  11/11/16 (!) 167/75  A/P: We discussed blood pressure goal of <140/90. Continue current meds: though meds are mostly for other cardiac purposes  CPE 05/21/17 or later  Orders Placed This Encounter  Procedures  . CBC     [place]  . Basic metabolic panel    [place]  . Hemoglobin A1c    [place]   Return precautions advised.  StepGarret Reddish

## 2017-01-02 NOTE — Assessment & Plan Note (Signed)
S: controlled on metoprolol 12.5mg  BID and lasix 40mg  daily ASCVD 10 year risk calculation if age 66-79: on statin BP Readings from Last 3 Encounters:  01/02/17 124/62  12/19/16 106/60  11/11/16 (!) 167/75  A/P: We discussed blood pressure goal of <140/90. Continue current meds: though meds are mostly for other cardiac purposes

## 2017-01-02 NOTE — Assessment & Plan Note (Signed)
S: reasonably controlled on atorvastatin 66m  Tuesday and Friday with last LDL 72. No myalgias.  Lab Results  Component Value Date   CHOL 148 05/14/2016   HDL 54.40 05/14/2016   LDLCALC 72 05/14/2016   LDLDIRECT 84.0 11/09/2014   TRIG 107.0 05/14/2016   CHOLHDL 3 05/14/2016   A/P: continue  meds- update lipids at physical. Continue weight loss efforts

## 2017-01-02 NOTE — Assessment & Plan Note (Signed)
S: well controlled. On metformin 500mg  BID. Weight has stabilized largely- had been losing. Back at gym after skin procedure  Lab Results  Component Value Date   HGBA1C 6.6 (H) 08/21/2016   HGBA1C 6.6 (H) 05/14/2016   HGBA1C 7.2 (H) 11/14/2015   A/P: update a1c today

## 2017-01-03 ENCOUNTER — Encounter (HOSPITAL_COMMUNITY): Payer: Medicare Other

## 2017-01-06 ENCOUNTER — Encounter (HOSPITAL_COMMUNITY): Payer: Medicare Other

## 2017-01-06 ENCOUNTER — Encounter: Payer: Self-pay | Admitting: Family Medicine

## 2017-01-08 ENCOUNTER — Encounter (HOSPITAL_COMMUNITY): Payer: Medicare Other

## 2017-01-09 NOTE — Addendum Note (Signed)
Encounter addended by: Magda Kiel, RN on: 01/09/2017 11:28 AM<BR>    Actions taken: Visit Navigator Flowsheet section accepted

## 2017-01-10 ENCOUNTER — Encounter (HOSPITAL_COMMUNITY): Payer: Medicare Other

## 2017-01-13 ENCOUNTER — Encounter (HOSPITAL_COMMUNITY): Payer: Medicare Other

## 2017-01-15 ENCOUNTER — Encounter (HOSPITAL_COMMUNITY): Payer: Medicare Other

## 2017-01-17 ENCOUNTER — Encounter (HOSPITAL_COMMUNITY): Payer: Medicare Other

## 2017-01-18 ENCOUNTER — Encounter: Payer: Self-pay | Admitting: Family Medicine

## 2017-01-20 ENCOUNTER — Encounter (HOSPITAL_COMMUNITY): Payer: Medicare Other

## 2017-01-20 ENCOUNTER — Encounter: Payer: Self-pay | Admitting: Family Medicine

## 2017-01-20 ENCOUNTER — Other Ambulatory Visit: Payer: Self-pay

## 2017-01-20 MED ORDER — SUMATRIPTAN SUCCINATE 100 MG PO TABS: 100.0000 mg | ORAL_TABLET | ORAL | 3 refills | Status: DC | PRN

## 2017-01-21 ENCOUNTER — Encounter: Payer: Self-pay | Admitting: Family Medicine

## 2017-01-22 ENCOUNTER — Encounter (HOSPITAL_COMMUNITY): Payer: Medicare Other

## 2017-01-24 ENCOUNTER — Encounter (HOSPITAL_COMMUNITY): Payer: Medicare Other

## 2017-01-27 ENCOUNTER — Encounter (HOSPITAL_COMMUNITY): Payer: Medicare Other

## 2017-01-29 ENCOUNTER — Encounter (HOSPITAL_COMMUNITY): Payer: Medicare Other

## 2017-02-05 ENCOUNTER — Encounter: Payer: Self-pay | Admitting: Family Medicine

## 2017-03-01 ENCOUNTER — Encounter: Payer: Self-pay | Admitting: Family Medicine

## 2017-03-19 LAB — HM MAMMOGRAPHY

## 2017-03-26 ENCOUNTER — Encounter: Payer: Self-pay | Admitting: Family Medicine

## 2017-03-31 ENCOUNTER — Encounter: Payer: Self-pay | Admitting: Family Medicine

## 2017-03-31 LAB — HM DIABETES EYE EXAM

## 2017-04-01 ENCOUNTER — Encounter: Payer: Self-pay | Admitting: Family Medicine

## 2017-04-02 ENCOUNTER — Encounter: Payer: Self-pay | Admitting: Family Medicine

## 2017-04-03 ENCOUNTER — Encounter: Payer: Self-pay | Admitting: Family Medicine

## 2017-04-08 ENCOUNTER — Encounter: Payer: Self-pay | Admitting: Family Medicine

## 2017-04-11 ENCOUNTER — Encounter: Payer: Self-pay | Admitting: Family Medicine

## 2017-04-12 ENCOUNTER — Encounter: Payer: Self-pay | Admitting: Family Medicine

## 2017-04-17 ENCOUNTER — Ambulatory Visit (INDEPENDENT_AMBULATORY_CARE_PROVIDER_SITE_OTHER): Payer: Medicare Other | Admitting: Family Medicine

## 2017-04-17 ENCOUNTER — Encounter: Payer: Self-pay | Admitting: Cardiology

## 2017-04-17 ENCOUNTER — Encounter: Payer: Self-pay | Admitting: Family Medicine

## 2017-04-17 VITALS — BP 128/72 | HR 61 | Temp 98.5°F | Ht 65.5 in | Wt 227.0 lb

## 2017-04-17 DIAGNOSIS — J329 Chronic sinusitis, unspecified: Secondary | ICD-10-CM

## 2017-04-17 DIAGNOSIS — R509 Fever, unspecified: Secondary | ICD-10-CM | POA: Diagnosis not present

## 2017-04-17 DIAGNOSIS — B9689 Other specified bacterial agents as the cause of diseases classified elsewhere: Secondary | ICD-10-CM

## 2017-04-17 LAB — POCT INFLUENZA A/B
Influenza A, POC: NEGATIVE
Influenza B, POC: NEGATIVE

## 2017-04-17 MED ORDER — AMOXICILLIN-POT CLAVULANATE 875-125 MG PO TABS: 1.0000 | ORAL_TABLET | Freq: Two times a day (BID) | ORAL | 0 refills | Status: AC

## 2017-04-17 NOTE — Progress Notes (Signed)
PCP: [name], MD  Subjective:  [name] is a 66 y.o. year old very pleasant female patient who presents with sinusitis symptoms including nasal congestion, sinus tenderness particularly on the right -other symptoms include: yellow nasal discharge at times -day of illness:over a month.  She was treated on the 4th by allergiest after over 10 days of symptoms with 10 day course of cefdinir and cortisone injection. 10 days later was treated with clarithromycin. In both cases symptoms improved significantly but did not resolve. States her right sided headache better but not resolved- still with some head pressure as well as some alternating hot/cold/chills.   She has been eating yogurt to try to balance stools while on antitbiotic.   We reviewed antibiotic allergy to penicillin- she reports before age 61 on a penicillin had a rash on chest only- no more significant symptoms.  -Symptoms are worsening again now -previous treatments: see above -sick contacts/travel/risks: denies flu exposure.  Has allergies but was being treated by alergist  ROS-denies fever, SOB, NVD, tooth pain  Pertinent Past Medical History-  Patient Active Problem List   Diagnosis Date Noted  . S/P minimally invasive aortic valve replacement with bioprosthetic valve 08/23/2016    Priority: High  . Chronic diastolic congestive heart failure ([place])     Priority: High  . Diabetes mellitus type 2, controlled ([place]) 05/12/2014    Priority: High  . Osteopenia 05/27/2016    Priority: Medium  . OSA (obstructive sleep apnea) 02/27/2016    Priority: Medium  . Elevated alkaline phosphatase level 05/16/2015    Priority: Medium  . GERD (gastroesophageal reflux disease) 03/10/2014    Priority: Medium  . Generalized anxiety disorder 03/10/2014    Priority: Medium  . Migraines 03/10/2014    Priority: Medium  . Irritable bowel syndrome 12/15/2008    Priority: Medium  . OBESITY, MORBID 10/12/2008    Priority:  Medium  . Hyperlipidemia 02/09/2008    Priority: Medium  . Bruxism 10/23/2015    Priority: Low  . Atypical chest pain 11/10/2014    Priority: Low  . Acne 05/12/2014    Priority: Low  . EUSTACHIAN TUBE DYSFUNCTION, CHRONIC 06/14/2010    Priority: Low  . Low back pain 08/11/2008    Priority: Low  . Allergic rhinitis 02/12/2007    Priority: Low  . ASTHMA 02/12/2007    Priority: Low  . Hypertension 01/02/2017  . Severe aortic stenosis 07/08/2016    Medications- reviewed  Current Outpatient Prescriptions  Medication Sig Dispense Refill  . aspirin EC 325 MG EC tablet Take 1 tablet (325 mg total) by mouth daily. 30 tablet 0  . atorvastatin (LIPITOR) 40 MG tablet TAKE 1 TABLET BY MOUTH AT NIGHT ON TUESDAY AND FRIDAY 30 tablet 4  . Biotin 5000 MCG TABS Take 5,000 mcg by mouth daily.     . Blood Glucose Monitoring Suppl (ONETOUCH VERIO) W/DEVICE KIT 1 kit by Does not apply route once. 1 kit 0  . calcium-vitamin D (OSCAL WITH D) 500-200 MG-UNIT per tablet Take 2 tablets by mouth daily.     . clindamycin (CLEOCIN) 300 MG capsule Take 2 capsules (600 mg total) by mouth 2 hours prior too dental cleaning. 2 capsule 0  . fluticasone (FLONASE) 50 MCG/ACT nasal spray Place 2 sprays into both nostrils daily.     . furosemide (LASIX) 40 MG tablet Take 1 tablet (40 mg total) by mouth daily. 60 tablet 11  . glucose blood (ONETOUCH VERIO) test strip Use to  test blood sugars daily. Dx: E11.9 100 each 12  . ipratropium (ATROVENT) 0.03 % nasal spray Place 2 sprays into both nostrils 2 (two) times daily.   3  . IRON, FERROUS SULFATE, PO Take 65 mg by mouth daily.    . Lancets (ONETOUCH ULTRASOFT) lancets Use to test blood sugars daily. Dx: E11.9 100 each 12  . loratadine (CLARITIN) 10 MG tablet Take 10 mg by mouth at bedtime.     . metFORMIN (GLUCOPHAGE) 500 MG tablet TAKE 1 TABLET WITH BREAKFAST AND 1/2 TO 1 TABLET WITH DINNER. 180 tablet 1  . metoCLOPramide (REGLAN) 10 MG tablet TAKE 1/2 TABLET BY MOUTH  4 TIMES A DAY 60 tablet 5  . metoprolol tartrate (LOPRESSOR) 25 MG tablet Take 0.5 tablets (12.5 mg total) by mouth 2 (two) times daily. 60 tablet 11  . Multiple Vitamin (MULTIVITAMIN WITH MINERALS) TABS tablet Take 1 tablet by mouth daily.    . mupirocin ointment (BACTROBAN) 2 % Apply 2 application topically 2 (two) times daily as needed.  0  . nabumetone (RELAFEN) 750 MG tablet TAKE 2 TABLETS BY MOUTH EVERY DAY 60 tablet 5  . omeprazole (PRILOSEC OTC) 20 MG tablet Take 40 mg by mouth daily with breakfast.    . potassium chloride SA (K-DUR,KLOR-CON) 20 MEQ tablet Take 1 tablet (20 mEq total) by mouth daily. 60 tablet 11  . Probiotic Product (ALIGN) 4 MG CAPS Take 4 mg by mouth daily.     . psyllium (METAMUCIL SMOOTH TEXTURE) 28 % packet Take 1 packet by mouth daily before breakfast.     . SUMAtriptan (IMITREX) 100 MG tablet Take 1 tablet (100 mg total) by mouth every 2 (two) hours as needed. TAKE 1 TABLET BY MOUTH EVERY 2 HOURS AS NEEDED FOR MIGRAINE. DO NOT EXCEED 2 DOSES IN 24 HOURS. 10 tablet 3  . venlafaxine XR (EFFEXOR-XR) 150 MG 24 hr capsule TAKE ONE CAPSULE BY MOUTH DAILY WITH BREAKFAST 90 capsule 1  . amoxicillin-clavulanate (AUGMENTIN) 875-125 MG tablet Take 1 tablet by mouth 2 (two) times daily. 28 tablet 0   No current facility-administered medications for this visit.     Objective: BP 128/72 (BP Location: Left Arm, Patient Position: Sitting, Cuff Size: Large)   Pulse 61   Temp 98.5 F (36.9 C) (Oral)   Ht 5' 5.5" (1.664 m)   Wt 227 lb (103 kg)   SpO2 99%   BMI 37.20 kg/m  Gen: NAD, resting comfortably HEENT: Turbinates erythematous with yellow drainage, TM normal, pharynx mildly erythematous with no tonsilar exudate or edema, right maxillary and frontal sinus tenderness CV: RRR no murmurs rubs or gallops Lungs: CTAB no crackles, wheeze, rhonchi Abdomen: soft/obese  Ext: no edema Skin: warm, dry, no rash  Assessment/Plan:  Sinsusitis Bacterial based on: Symptoms >10  days, double sickening, or severe symptoms in first 3 days. She has had 2 course of antibiotics of 10 days. Never tried 14 days. Has not tried augmentin due to possible allergy. We discussed trial of augmentin and change to levaquin if she cannot tolerate but wanted to use levaquin other than potential side effects. We opted out of repeating steroid injection.   We agreed to refer to ENT if fails to improve  Finally, we reviewed reasons to return to care including if symptoms worsen or persist or new concerns arise (particularly fever or shortness of breath)  Meds ordered this encounter  Medications  . IRON, FERROUS SULFATE, PO    Sig: Take 65 mg by mouth  daily.  . amoxicillin-clavulanate (AUGMENTIN) 875-125 MG tablet    Sig: Take 1 tablet by mouth 2 (two) times daily.    Dispense:  28 tablet    Refill:  0  although she has been treated by allergist this is a new acute issue to our clinic. High risk patient with age, recent antibiotic exposure.   Garret Reddish, MD

## 2017-04-17 NOTE — Patient Instructions (Signed)
Trial augmentin for 2 weeks for sinusitis- if this does not symptoms out- refer to ENT  If you get a rash with this- we will change to levaquin  Let me know if you have new or worsening symptoms  Flu test negative

## 2017-04-18 MED ORDER — CLINDAMYCIN HCL 300 MG PO CAPS: ORAL_CAPSULE | ORAL | 0 refills | Status: DC

## 2017-04-18 NOTE — Telephone Encounter (Signed)
Spoke with the pt on the phone and informed her that both Dr Meda Coffee and our Pharmacist [facility], agree that she should take Clindamycin 600 mg po 1 hour prior too dental cleaning.  Confirmed the pharmacy of choice with the pt.  Pt verbalized understanding and agrees with this plan.

## 2017-04-20 ENCOUNTER — Encounter: Payer: Self-pay | Admitting: Family Medicine

## 2017-04-21 MED ORDER — LEVOFLOXACIN 500 MG PO TABS: 500.0000 mg | ORAL_TABLET | Freq: Every day | ORAL | 0 refills | Status: AC

## 2017-04-21 NOTE — Telephone Encounter (Signed)
Patient called in reference to note below. Informed patient of levaquin being sent to pharmacy per Dr. Yong Channel.

## 2017-04-24 ENCOUNTER — Ambulatory Visit (INDEPENDENT_AMBULATORY_CARE_PROVIDER_SITE_OTHER): Payer: Medicare Other | Admitting: Cardiology

## 2017-04-24 ENCOUNTER — Encounter: Payer: Self-pay | Admitting: Cardiology

## 2017-04-24 ENCOUNTER — Encounter: Payer: Self-pay | Admitting: Family Medicine

## 2017-04-24 VITALS — BP 124/76 | HR 58 | Ht 65.5 in | Wt 229.0 lb

## 2017-04-24 DIAGNOSIS — Z953 Presence of xenogenic heart valve: Secondary | ICD-10-CM

## 2017-04-24 MED ORDER — ASPIRIN EC 81 MG PO TBEC: 81.0000 mg | DELAYED_RELEASE_TABLET | Freq: Every day | ORAL | 3 refills | Status: DC

## 2017-04-24 MED ORDER — METOPROLOL TARTRATE 25 MG PO TABS: 12.5000 mg | ORAL_TABLET | Freq: Two times a day (BID) | ORAL | 11 refills | Status: DC

## 2017-04-24 MED ORDER — FUROSEMIDE 40 MG PO TABS: 40.0000 mg | ORAL_TABLET | Freq: Every day | ORAL | 11 refills | Status: DC

## 2017-04-24 MED ORDER — POTASSIUM CHLORIDE CRYS ER 20 MEQ PO TBCR: 20.0000 meq | EXTENDED_RELEASE_TABLET | Freq: Every day | ORAL | 11 refills | Status: DC

## 2017-04-24 MED ORDER — ATORVASTATIN CALCIUM 40 MG PO TABS: ORAL_TABLET | ORAL | 6 refills | Status: DC

## 2017-04-24 NOTE — Patient Instructions (Signed)
Medication Instructions:   DECREASE YOUR ASPRIN TO 81 MG ONCE DAILY    Labwork:  TODAY--CMET, CBC W DIFF, TSH      Testing/Procedures:  Your physician has requested that you have an echocardiogram. Echocardiography is a painless test that uses sound waves to create images of your heart. It provides your doctor with information about the size and shape of your heart and how well your heart's chambers and valves are working. This procedure takes approximately one hour. There are no restrictions for this procedure.    Follow-Up:  Your physician wants you to follow-up in: [place] will receive a reminder letter in the mail two months in advance. If you don't receive a letter, please call our office to schedule the follow-up appointment.     DR Meda Coffee RECOMMENDS [facility] FOR PLASTIC SURGERY AT [phone], TO ARRANGE CONSULT APPOINTMENT     If you need a refill on your cardiac medications before your next appointment, please call your pharmacy.

## 2017-04-24 NOTE — Progress Notes (Signed)
Cardiology Office Note:    Date:  04/24/2017   ID:  [name], DOB [birth year], MRN [id number]  PCP:  [name], MD  Cardiologist:  Dr Meda Coffee  Referring MD: [name], MD   No chief complaint on file.   History of Present Illness:    [name] is a 66 y.o. female with a hx of Diabetes, hyperlipidemia, OSA on CPAP, and aortic valve replacement with bioprosthetic bovine pericardial tissue valve 08/23/16. Patient had only minor nonobstructive CAD on heart catheterization 08/02/16 done in preparation for AVR. Also right heart cath showed normal right heart hemodynamics. Echocardiogram showed normal EF of 60-65% and grade 1 diastolic dysfunction.  Pt is feeling very well and much better after the valve replacement than before. No chest pain or tightness except for mild soreness at surgical site. No shortness of breath, DOE, orthopnea, or edema.   Completed 8 weeks of cardiac rehab and working out at [employer] (has taken a week off due to having a mole on her leg removed). She is doing cardio exercises with 10 minutes walking, 10 minutes on bike and 10 minutes on Nu [employer] and doing some light weights.  She uses her CPAP every night.   April 24, 2017 -this is 3 months follow-up, she has been feeling great, with improved shortness of breath no chest pain.  Lower extremity edema has resolved, denies orthopnea or paroxysmal nocturnal dyspnea.  She has developed headaches and was diagnosed with acute sinus infection, she is on assert antibiotics with only mild improvement so far.  She has been compliant with her medications and denies any side effects.  She has been started on  Iron pills for her microcytic anemia and developed mild constipation.  She denies any palpitations syncope or falls.  She developed keloid in her post sternotomy scar.  Past Medical History:  Diagnosis Date  . Allergy   . Anxiety   . Arthritis    back & knee  . Asthma     mild per pt  shows up with resp illness  . Chronic diastolic congestive heart failure ([place])   . Colon polyps   . Diabetes mellitus without complication ([place])   . Gastric polyps   . Gastroparesis   . GERD (gastroesophageal reflux disease)   . Headache    sinus headaches and migraines at times  . Heart murmur   . History of migraine headaches   . Hyperlipidemia   . IBS (irritable bowel syndrome)   . Lumbar disc disease   . S/P aortic valve replacement with bioprosthetic valve 08/23/2016   25 mm Edwards Intuity rapid-deployment bovine pericardial tissue valve via partial upper mini sternotomy  . Sleep apnea     Past Surgical History:  Procedure Laterality Date  . ABDOMINAL HYSTERECTOMY    . AORTIC VALVE REPLACEMENT N/A 08/23/2016   Procedure: AORTIC VALVE REPLACEMENT (AVR) - using partial Upper Sternotomy- 5mm Edwards Intuity Aortic Valve used;  Surgeon: Rexene Alberts, MD;  Location: [facility];  Service: Open Heart Surgery;  Laterality: N/A;  . BLADDER SUSPENSION  2007  . BREAST ENHANCEMENT SURGERY  1980  . CARPAL TUNNEL RELEASE Bilateral   . COLONOSCOPY    . DORSAL COMPARTMENT RELEASE Right 09/15/2014   Procedure: RIGHT WRIST DEQUERVAINS RELEASE ;  Surgeon: Kathryne Hitch, MD;  Location: [facility];  Service: Orthopedics;  Laterality: Right;  . ESOPHAGOGASTRODUODENOSCOPY    . KNEE ARTHROSCOPY  04/15/2012   Procedure: ARTHROSCOPY KNEE;  Surgeon: [name], MD;  Location: [facility];  Service: Orthopedics;  Laterality: Right;  . LAPAROSCOPIC CHOLECYSTECTOMY    . LASIK    . OOPHORECTOMY    . PALATE TO GINGIVA GRAFT  2017  . RIGHT/LEFT HEART CATH AND CORONARY ANGIOGRAPHY N/A 08/02/2016   Procedure: Right/Left Heart Cath and Coronary Angiography;  Surgeon: Sherren Mocha, MD;  Location: [facility];  Service: Cardiovascular;  Laterality: N/A;  . ROTATOR CUFF REPAIR Left   . TEE WITHOUT CARDIOVERSION N/A 08/23/2016   Procedure: TRANSESOPHAGEAL ECHOCARDIOGRAM (TEE);  Surgeon: Rexene Alberts, MD;  Location: [facility];  Service: Open Heart Surgery;  Laterality: N/A;  . TOTAL KNEE ARTHROPLASTY  04/15/2012   Procedure: TOTAL KNEE ARTHROPLASTY;  Surgeon: [name], MD;  Location: [facility];  Service: Orthopedics;  Laterality: Right;  . TRIGGER FINGER RELEASE Right 04/20/2015   Procedure: RIGHT TRIGGER FINGER RELEASE (TENDON SHEALTH INCISION) ;  Surgeon: [name], MD;  Location: [facility];  Service: Orthopedics;  Laterality: Right;    Current Medications: No outpatient prescriptions have been marked as taking for the 04/24/17 encounter (Office Visit) with Dorothy Spark, MD.     Allergies:   Augmentin [amoxicillin-pot clavulanate]; Erythromycin; and Penicillins   Social History   Social History  . Marital status: Married    Spouse name: N/A  . Number of children: 1  . Years of education: N/A   Occupational History  . Retired Retired   Social History Main Topics  . Smoking status: Never Smoker  . Smokeless tobacco: Never Used  . Alcohol use No  . Drug use: No  . Sexual activity: Yes   Other Topics Concern  . None   Social History Narrative   She lives with husband (1978) and two dogs. Step-son [name] (1971-psychiatrist in [place]). No grandkids. 2 dogs-sheltie/collie mix and border collie mix      Highest level of education:  Master in education   She is retired Animal nutritionist x 32 years.      Hobbies: time with dogs              Family History: The patient's family history includes Alcohol abuse in her father; COPD in her mother; Colon polyps in her maternal aunt and mother; Heart disease in her father; Irritable bowel syndrome in her mother. ROS:   Please see the history of present illness.     All other systems reviewed and are negative.  EKGs/Labs/Other Studies Reviewed:    The following studies were reviewed today:  Right/Left Heart Cath and Coronary Angiography 08/02/16   1. Minor nonobstructive CAD 2. Normal  right heart hemodynamics (including normal PCWP) 3. Known severe aortic stenosis    Intraoperative TEE 08/23/16  Left ventricle: Normal cavity size. Concentric hypertrophy of moderate severity. LV systolic function is normal with an EF of 60-65%. There are no obvious wall motion abnormalities.  Septum: No Patent Foramen Ovale present.  Left atrium: Patent foramen ovale not present.  Aortic valve: The valve is bicuspid. Severe valve thickening present. Severe valve calcification present. Moderately decreased leaflet separation. Severe stenosis. No regurgitation. No AV vegetation. No evidence of papillary fibroelastoma.  Aorta: The ascending aorta is mildly dilated.  Mitral valve: No leaflet thickening and calcification present. Mild mitral annular calcification.  Right ventricle: Normal cavity size, wall thickness and ejection fraction.  Tricuspid valve: Trace regurgitation. The tricuspid valve regurgitation jet is central.   EKG:  EKG is ordered today  and personally reviewed, which shows sinus bradycardia 58 bpm, LVH unchanged from prior.    Recent Labs: 08/24/2016: Magnesium 2.0 09/16/2016: ALT 11; NT-Pro BNP 324; TSH 3.340 01/02/2017: BUN 16; Creatinine, Ser 0.68; Hemoglobin 10.9; Platelets 374.0; Potassium 4.3; Sodium 132   Recent Lipid Panel    Component Value Date/Time   CHOL 148 05/14/2016 0818   TRIG 107.0 05/14/2016 0818   HDL 54.40 05/14/2016 0818   CHOLHDL 3 05/14/2016 0818   VLDL 21.4 05/14/2016 0818   LDLCALC 72 05/14/2016 0818   LDLDIRECT 84.0 11/09/2014 1117    Physical Exam:    VS:  BP 124/76   Pulse (!) 58   Ht 5' 5.5" (1.664 m)   Wt 229 lb (103.9 kg)   SpO2 99%   BMI 37.53 kg/m     Wt Readings from Last 3 Encounters:  04/24/17 229 lb (103.9 kg)  04/17/17 227 lb (103 kg)  01/02/17 226 lb 9.6 oz (102.8 kg)     GEN: Well nourished, well developed in no acute distress HEENT: Normal NECK: No JVD; No carotid bruits LYMPHATICS: No  lymphadenopathy CARDIAC: RRR, 2 out of 6 diastolic murmur, rubs, gallops RESPIRATORY:  Clear to auscultation without rales, wheezing or rhonchi  ABDOMEN: Soft, non-tender, non-distended MUSCULOSKELETAL:  No edema; No deformity  SKIN: Warm and dry NEUROLOGIC:  Alert and oriented x 3 PSYCHIATRIC:  Normal affect   ASSESSMENT:    No diagnosis found.   PLAN:    In order of problems listed above:  S/P AVR with bovine tissue valve 08/23/16 - Has completed 8 weeks of cardiac rehab and exercises regularly at the [facility] in [facility] without exertional symptoms.  -She has new diastolic murmur, there is now echocardiogram after surgery.  We will arrange for one now. Claiborne Billings in sternotomy scar, will refer to [facility] plastic surgery.  Chronic diastolic CHF -Started on Lasix 40 mg p.o. daily, her symptoms have resolved she is no residual lower extremity edema.  She is advised to use Lasix just as needed.  Hyperlipidemia -Last lipid panel on 05/14/16 showed LDL 72 -Conitnue atorvastatin 40 mg daily, tolerating well.  OSA -Using CPAP consistently every night.    Medication Adjustments/Labs and Tests Ordered: Current medicines are reviewed at length with the patient today.  Concerns regarding medicines are outlined above. Labs and tests ordered and medication changes are outlined in the patient instructions below:  There are no Patient Instructions on file for this visit.   Signed, Ena Dawley, MD  04/24/2017 11:51 AM    [place]

## 2017-04-25 LAB — COMPREHENSIVE METABOLIC PANEL
ALT: 17 IU/L (ref 0–32)
AST: 23 IU/L (ref 0–40)
Albumin/Globulin Ratio: 1.9 (ref 1.2–2.2)
Albumin: 4.3 g/dL (ref 3.6–4.8)
Alkaline Phosphatase: 163 IU/L — ABNORMAL HIGH (ref 39–117)
BUN/Creatinine Ratio: 21 (ref 12–28)
BUN: 13 mg/dL (ref 8–27)
Bilirubin Total: 0.2 mg/dL (ref 0.0–1.2)
CO2: 25 mmol/L (ref 20–29)
Calcium: 9.2 mg/dL (ref 8.7–10.3)
Chloride: 91 mmol/L — ABNORMAL LOW (ref 96–106)
Creatinine, Ser: 0.61 mg/dL (ref 0.57–1.00)
GFR calc Af Amer: 109 mL/min/{1.73_m2} (ref >59)
GFR calc non Af Amer: 95 mL/min/{1.73_m2} (ref >59)
Globulin, Total: 2.3 g/dL (ref 1.5–4.5)
Glucose: 95 mg/dL (ref 65–99)
Potassium: 4.5 mmol/L (ref 3.5–5.2)
Sodium: 132 mmol/L — ABNORMAL LOW (ref 134–144)
Total Protein: 6.6 g/dL (ref 6.0–8.5)

## 2017-04-25 LAB — CBC WITH DIFFERENTIAL/PLATELET
Basophils Absolute: 0 10*3/uL (ref 0.0–0.2)
Basos: 0 %
EOS (ABSOLUTE): 0.1 10*3/uL (ref 0.0–0.4)
Eos: 1 %
Hematocrit: 37.1 % (ref 34.0–46.6)
Hemoglobin: 12.3 g/dL (ref 11.1–15.9)
Immature Grans (Abs): 0 10*3/uL (ref 0.0–0.1)
Immature Granulocytes: 0 %
Lymphocytes Absolute: 2.6 10*3/uL (ref 0.7–3.1)
Lymphs: 27 %
MCH: 27 pg (ref 26.6–33.0)
MCHC: 33.2 g/dL (ref 31.5–35.7)
MCV: 81 fL (ref 79–97)
Monocytes Absolute: 0.9 10*3/uL (ref 0.1–0.9)
Monocytes: 9 %
Neutrophils Absolute: 6.2 10*3/uL (ref 1.4–7.0)
Neutrophils: 63 %
Platelets: 358 10*3/uL (ref 150–379)
RBC: 4.56 x10E6/uL (ref 3.77–5.28)
RDW: 17.8 % — ABNORMAL HIGH (ref 12.3–15.4)
WBC: 9.8 10*3/uL (ref 3.4–10.8)

## 2017-04-25 LAB — TSH: TSH: 3.39 u[IU]/mL (ref 0.450–4.500)

## 2017-05-01 ENCOUNTER — Ambulatory Visit (HOSPITAL_COMMUNITY): Payer: Medicare Other | Attending: Cardiology

## 2017-05-01 ENCOUNTER — Other Ambulatory Visit: Payer: Self-pay

## 2017-05-01 DIAGNOSIS — I358 Other nonrheumatic aortic valve disorders: Secondary | ICD-10-CM | POA: Diagnosis present

## 2017-05-01 DIAGNOSIS — E785 Hyperlipidemia, unspecified: Secondary | ICD-10-CM | POA: Diagnosis not present

## 2017-05-01 DIAGNOSIS — I509 Heart failure, unspecified: Secondary | ICD-10-CM | POA: Diagnosis not present

## 2017-05-01 DIAGNOSIS — I110 Hypertensive heart disease with heart failure: Secondary | ICD-10-CM | POA: Diagnosis not present

## 2017-05-01 DIAGNOSIS — Z48812 Encounter for surgical aftercare following surgery on the circulatory system: Secondary | ICD-10-CM | POA: Diagnosis present

## 2017-05-01 DIAGNOSIS — E119 Type 2 diabetes mellitus without complications: Secondary | ICD-10-CM | POA: Diagnosis not present

## 2017-05-01 DIAGNOSIS — Z953 Presence of xenogenic heart valve: Secondary | ICD-10-CM | POA: Diagnosis present

## 2017-05-03 ENCOUNTER — Encounter: Payer: Self-pay | Admitting: Family Medicine

## 2017-05-06 ENCOUNTER — Other Ambulatory Visit: Payer: Self-pay

## 2017-05-06 DIAGNOSIS — J301 Allergic rhinitis due to pollen: Secondary | ICD-10-CM

## 2017-05-17 ENCOUNTER — Encounter: Payer: Self-pay | Admitting: Family Medicine

## 2017-05-20 ENCOUNTER — Other Ambulatory Visit: Payer: Self-pay | Admitting: Otolaryngology

## 2017-05-20 DIAGNOSIS — J329 Chronic sinusitis, unspecified: Secondary | ICD-10-CM

## 2017-05-21 ENCOUNTER — Other Ambulatory Visit: Payer: Medicare Other

## 2017-05-23 ENCOUNTER — Ambulatory Visit (INDEPENDENT_AMBULATORY_CARE_PROVIDER_SITE_OTHER): Payer: Medicare Other | Admitting: Family Medicine

## 2017-05-23 ENCOUNTER — Encounter: Payer: Self-pay | Admitting: Family Medicine

## 2017-05-23 VITALS — BP 136/64 | HR 74 | Temp 98.1°F | Ht 65.5 in | Wt 227.8 lb

## 2017-05-23 DIAGNOSIS — M85851 Other specified disorders of bone density and structure, right thigh: Secondary | ICD-10-CM

## 2017-05-23 DIAGNOSIS — G4733 Obstructive sleep apnea (adult) (pediatric): Secondary | ICD-10-CM | POA: Diagnosis not present

## 2017-05-23 DIAGNOSIS — E785 Hyperlipidemia, unspecified: Secondary | ICD-10-CM | POA: Diagnosis not present

## 2017-05-23 DIAGNOSIS — Z953 Presence of xenogenic heart valve: Secondary | ICD-10-CM | POA: Diagnosis not present

## 2017-05-23 DIAGNOSIS — Z0000 Encounter for general adult medical examination without abnormal findings: Secondary | ICD-10-CM | POA: Diagnosis not present

## 2017-05-23 DIAGNOSIS — F411 Generalized anxiety disorder: Secondary | ICD-10-CM

## 2017-05-23 DIAGNOSIS — Z23 Encounter for immunization: Secondary | ICD-10-CM

## 2017-05-23 DIAGNOSIS — M85852 Other specified disorders of bone density and structure, left thigh: Secondary | ICD-10-CM

## 2017-05-23 DIAGNOSIS — E119 Type 2 diabetes mellitus without complications: Secondary | ICD-10-CM

## 2017-05-23 DIAGNOSIS — I5032 Chronic diastolic (congestive) heart failure: Secondary | ICD-10-CM | POA: Diagnosis not present

## 2017-05-23 DIAGNOSIS — I10 Essential (primary) hypertension: Secondary | ICD-10-CM

## 2017-05-23 LAB — HEMOGLOBIN A1C: Hgb A1c MFr Bld: 6.4 % (ref 4.6–6.5)

## 2017-05-23 LAB — COMPREHENSIVE METABOLIC PANEL
ALT: 13 U/L (ref 0–35)
AST: 21 U/L (ref 0–37)
Albumin: 4.3 g/dL (ref 3.5–5.2)
Alkaline Phosphatase: 138 U/L — ABNORMAL HIGH (ref 39–117)
BUN: 18 mg/dL (ref 6–23)
CO2: 29 mEq/L (ref 19–32)
Calcium: 9.3 mg/dL (ref 8.4–10.5)
Chloride: 98 mEq/L (ref 96–112)
Creatinine, Ser: 0.66 mg/dL (ref 0.40–1.20)
GFR: 95.09 mL/min (ref >60.00)
Glucose, Bld: 122 mg/dL — ABNORMAL HIGH (ref 70–99)
Potassium: 4.6 mEq/L (ref 3.5–5.1)
Sodium: 133 mEq/L — ABNORMAL LOW (ref 135–145)
Total Bilirubin: 0.5 mg/dL (ref 0.2–1.2)
Total Protein: 7 g/dL (ref 6.0–8.3)

## 2017-05-23 LAB — LIPID PANEL
Cholesterol: 167 mg/dL (ref 0–200)
HDL: 57.8 mg/dL (ref >39.00)
LDL Cholesterol: 90 mg/dL (ref 0–99)
NonHDL: 109.6
Total CHOL/HDL Ratio: 3
Triglycerides: 98 mg/dL (ref 0.0–149.0)
VLDL: 19.6 mg/dL (ref 0.0–40.0)

## 2017-05-23 LAB — MICROALBUMIN / CREATININE URINE RATIO
Creatinine,U: 161.9 mg/dL
Microalb Creat Ratio: 1.1 mg/g (ref 0.0–30.0)
Microalb, Ur: 1.8 mg/dL (ref 0.0–1.9)

## 2017-05-23 NOTE — Progress Notes (Signed)
Phone: [phone]  Subjective:  Patient presents today for their annual physical. Chief complaint-noted.   See problem oriented charting- ROS- full  review of systems was completed and negative except for: sinus congestion- has upcoming sinus CT. No recent wheezing or shortness of breath. Slight weight gain with thanksgiving  The following were reviewed and entered/updated in epic: Past Medical History:  Diagnosis Date  . Allergy   . Anxiety   . Arthritis    back & knee  . Asthma     mild per pt shows up with resp illness  . Chronic diastolic congestive heart failure ([place])   . Colon polyps   . Diabetes mellitus without complication ([place])   . Gastric polyps   . Gastroparesis   . GERD (gastroesophageal reflux disease)   . Headache    sinus headaches and migraines at times  . Heart murmur   . History of migraine headaches   . Hyperlipidemia   . IBS (irritable bowel syndrome)   . Lumbar disc disease   . S/P aortic valve replacement with bioprosthetic valve 08/23/2016   25 mm Edwards Intuity rapid-deployment bovine pericardial tissue valve via partial upper mini sternotomy  . Sleep apnea    Patient Active Problem List   Diagnosis Date Noted  . S/P minimally invasive aortic valve replacement with bioprosthetic valve 08/23/2016    Priority: High  . Chronic diastolic congestive heart failure ([place])     Priority: High  . Severe aortic stenosis 07/08/2016    Priority: High  . Diabetes mellitus type 2, controlled ([place]) 05/12/2014    Priority: High  . Hypertension 01/02/2017    Priority: Medium  . Osteopenia 05/27/2016    Priority: Medium  . OSA (obstructive sleep apnea) 02/27/2016    Priority: Medium  . Elevated alkaline phosphatase level 05/16/2015    Priority: Medium  . GERD (gastroesophageal reflux disease) 03/10/2014    Priority: Medium  . Generalized anxiety disorder 03/10/2014    Priority: Medium  . Migraines 03/10/2014    Priority: Medium  . Irritable bowel  syndrome 12/15/2008    Priority: Medium  . OBESITY, MORBID 10/12/2008    Priority: Medium  . Hyperlipidemia 02/09/2008    Priority: Medium  . Bruxism 10/23/2015    Priority: Low  . Atypical chest pain 11/10/2014    Priority: Low  . Acne 05/12/2014    Priority: Low  . EUSTACHIAN TUBE DYSFUNCTION, CHRONIC 06/14/2010    Priority: Low  . Low back pain 08/11/2008    Priority: Low  . Allergic rhinitis 02/12/2007    Priority: Low  . ASTHMA 02/12/2007    Priority: Low   Past Surgical History:  Procedure Laterality Date  . ABDOMINAL HYSTERECTOMY    . AORTIC VALVE REPLACEMENT N/A 08/23/2016   Procedure: AORTIC VALVE REPLACEMENT (AVR) - using partial Upper Sternotomy- 16m Edwards Intuity Aortic Valve used;  Surgeon: CRexene Alberts MD;  Location: [facility]  Service: Open Heart Surgery;  Laterality: N/A;  . BLADDER SUSPENSION  2007  . BREAST ENHANCEMENT SURGERY  1980  . CARPAL TUNNEL RELEASE Bilateral   . COLONOSCOPY    . DORSAL COMPARTMENT RELEASE Right 09/15/2014   Procedure: RIGHT WRIST DEQUERVAINS RELEASE ;  Surgeon: DKathryne Hitch MD;  Location: [facility]  Service: Orthopedics;  Laterality: Right;  . ESOPHAGOGASTRODUODENOSCOPY    . KNEE ARTHROSCOPY  04/15/2012   Procedure: ARTHROSCOPY KNEE;  Surgeon: DNinetta Lights MD;  Location[facility]  Service: Orthopedics;  Laterality: Right;  . LAPAROSCOPIC  CHOLECYSTECTOMY    . LASIK    . OOPHORECTOMY    . PALATE TO GINGIVA GRAFT  2017  . RIGHT/LEFT HEART CATH AND CORONARY ANGIOGRAPHY N/A 08/02/2016   Procedure: Right/Left Heart Cath and Coronary Angiography;  Surgeon: Sherren Mocha, MD;  Location: [facility];  Service: Cardiovascular;  Laterality: N/A;  . ROTATOR CUFF REPAIR Left   . TEE WITHOUT CARDIOVERSION N/A 08/23/2016   Procedure: TRANSESOPHAGEAL ECHOCARDIOGRAM (TEE);  Surgeon: Rexene Alberts, MD;  Location: [facility];  Service: Open Heart Surgery;  Laterality: N/A;  . TOTAL KNEE ARTHROPLASTY  04/15/2012   Procedure:  TOTAL KNEE ARTHROPLASTY;  Surgeon: [name], MD;  Location: [facility];  Service: Orthopedics;  Laterality: Right;  . TRIGGER FINGER RELEASE Right 04/20/2015   Procedure: RIGHT TRIGGER FINGER RELEASE (TENDON SHEALTH INCISION) ;  Surgeon: [name], MD;  Location: [facility];  Service: Orthopedics;  Laterality: Right;    Family History  Problem Relation Age of Onset  . COPD Mother   . Colon polyps Mother   . Irritable bowel syndrome Mother   . Heart disease Father   . Alcohol abuse Father   . Colon polyps Maternal Aunt     Medications- reviewed and updated Current Outpatient Medications  Medication Sig Dispense Refill  . aspirin EC 81 MG tablet Take 1 tablet (81 mg total) by mouth daily. 90 tablet 3  . atorvastatin (LIPITOR) 40 MG tablet TAKE 1 TABLET BY MOUTH AT NIGHT ON TUESDAY AND FRIDAY 30 tablet 6  . Biotin 5000 MCG TABS Take 5,000 mcg by mouth daily.     . Blood Glucose Monitoring Suppl (ONETOUCH VERIO) W/DEVICE KIT 1 kit by Does not apply route once. 1 kit 0  . calcium-vitamin D (OSCAL WITH D) 500-200 MG-UNIT per tablet Take 2 tablets by mouth daily.     . clindamycin (CLEOCIN) 300 MG capsule Take 2 capsules (600 mg total) by mouth 1 hour prior too dental cleaning. 2 capsule 0  . fluticasone (FLONASE) 50 MCG/ACT nasal spray Place 2 sprays into both nostrils daily.     . furosemide (LASIX) 40 MG tablet Take 1 tablet (40 mg total) by mouth daily. 60 tablet 11  . glucose blood (ONETOUCH VERIO) test strip Use to test blood sugars daily. Dx: E11.9 100 each 12  . ipratropium (ATROVENT) 0.03 % nasal spray Place 2 sprays into both nostrils 2 (two) times daily.   3  . Lancets (ONETOUCH ULTRASOFT) lancets Use to test blood sugars daily. Dx: E11.9 100 each 12  . loratadine (CLARITIN) 10 MG tablet Take 10 mg by mouth at bedtime.     . metFORMIN (GLUCOPHAGE) 500 MG tablet TAKE 1 TABLET WITH BREAKFAST AND 1/2 TO 1 TABLET WITH DINNER. 180 tablet 1  . metoCLOPramide  (REGLAN) 10 MG tablet TAKE 1/2 TABLET BY MOUTH 4 TIMES A DAY 60 tablet 5  . metoprolol tartrate (LOPRESSOR) 25 MG tablet Take 0.5 tablets (12.5 mg total) by mouth 2 (two) times daily. 60 tablet 11  . Multiple Vitamin (MULTIVITAMIN WITH MINERALS) TABS tablet Take 1 tablet by mouth daily.    . nabumetone (RELAFEN) 750 MG tablet TAKE 2 TABLETS BY MOUTH EVERY DAY 60 tablet 5  . omeprazole (PRILOSEC OTC) 20 MG tablet Take 40 mg by mouth daily with breakfast.    . potassium chloride SA (K-DUR,KLOR-CON) 20 MEQ tablet Take 1 tablet (20 mEq total) by mouth daily. 60 tablet 11  . Probiotic Product (ALIGN) 4 MG  CAPS Take 4 mg by mouth daily.     . psyllium (METAMUCIL SMOOTH TEXTURE) 28 % packet Take 1 packet by mouth daily before breakfast.     . SUMAtriptan (IMITREX) 100 MG tablet Take 1 tablet (100 mg total) by mouth every 2 (two) hours as needed. TAKE 1 TABLET BY MOUTH EVERY 2 HOURS AS NEEDED FOR MIGRAINE. DO NOT EXCEED 2 DOSES IN 24 HOURS. 10 tablet 3  . venlafaxine XR (EFFEXOR-XR) 150 MG 24 hr capsule TAKE ONE CAPSULE BY MOUTH DAILY WITH BREAKFAST 90 capsule 1   No current facility-administered medications for this visit.     Allergies-reviewed and updated Allergies  Allergen Reactions  . Augmentin [Amoxicillin-Pot Clavulanate] Rash    rash  . Erythromycin Nausea Only  . Penicillins Itching and Rash    Has patient had a PCN reaction causing immediate rash, facial/tongue/throat swelling, SOB or lightheadedness with hypotension: No Has patient had a PCN reaction causing severe rash involving mucus membranes or skin necrosis: No Has patient had a PCN reaction that required hospitalization: No Has patient had a PCN reaction occurring within the last 10 years: No  If all of the above answers are "NO", then may proceed with Cephalosporin use.     Social History   Socioeconomic History  . Marital status: Married    Spouse name: None  . Number of children: 1  . Years of education: None  .  Highest education level: None  Social Needs  . Financial resource strain: None  . Food insecurity - worry: None  . Food insecurity - inability: None  . Transportation needs - medical: None  . Transportation needs - non-medical: None  Occupational History  . Occupation: Retired    Fish farm manager: RETIRED  Tobacco Use  . Smoking status: Never Smoker  . Smokeless tobacco: Never Used  Substance and Sexual Activity  . Alcohol use: No  . Drug use: No  . Sexual activity: Yes  Other Topics Concern  . None  Social History Narrative   She lives with husband (1978) and two dogs. Step-son [name] (1971-psychiatrist in [place]). No grandkids. 2 dogs-sheltie/collie mix and border collie mix      Highest level of education:  Master in education   She is retired Animal nutritionist x 32 years.      Hobbies: time with dogs             Objective: BP 136/64 (BP Location: Right Arm, Patient Position: Sitting, Cuff Size: Large)   Pulse 74   Temp 98.1 F (36.7 C) (Oral)   Ht 5' 5.5" (1.664 m)   Wt 227 lb 12.8 oz (103.3 kg)   SpO2 98%   BMI 37.33 kg/m  Gen: NAD, resting comfortably HEENT: Mucous membranes are moist. Oropharynx normal Neck: no thyromegaly CV: RRR no murmurs rubs or gallops Lungs: CTAB no crackles, wheeze, rhonchi Abdomen: soft/nontender/nondistended/normal bowel sounds. No rebound or guarding. Obese Ext: no edema Skin: warm, dry Neuro: grossly normal, moves all extremities, PERRLA  Assessment/Plan:  66 y.o. female presenting for annual physical.  Health Maintenance counseling: 1. Anticipatory guidance: Patient counseled regarding regular dental exams -q6 months, eye exams -yearly, wearing seatbelts.  2. Risk factor reduction:  Advised patient of need for regular exercise and diet rich and fruits and vegetables to reduce risk of heart attack and stroke. Exercise- has been down- trouble with sinus issues- hoping to pick this back up. Diet-overall feels like doing well except  for thanksgiving. Weight up actually 1 lb  from last visit in July Wt Readings from Last 3 Encounters:  05/23/17 227 lb 12.8 oz (103.3 kg)  04/24/17 229 lb (103.9 kg)  04/17/17 227 lb (103 kg)  3. Immunizations/screenings/ancillary studies- Pneumovax 23 today  Immunization History  Administered Date(s) Administered  . Influenza Split 04/04/2011, 03/05/2012  . Influenza Whole 04/16/2007, 03/16/2009, 03/13/2010  . Influenza,inj,Quad PF,6+ Mos 03/30/2013, 03/10/2014, 03/23/2015, 02/27/2016  . Influenza-Unspecified 02/27/2017, 02/28/2017  . Pneumococcal Conjugate-13 05/21/2016  . Td 06/24/1996, 08/06/2007  . Zoster 05/12/2014  4. Cervical cancer screening- still sees gyn but now every 2 years. Had hysterectomy for benign reasons so does not have to have pap smear plus over age 38.  65. Breast cancer screening-  breast exam with GYN with GYN and mammogram 03/19/17 6. Colon cancer screening - 07/28/14 with 10 year follow up 7. Skin cancer screening- Dr. Elvera Lennox sees her for dermatology. advised regular sunscreen use. Denies worrisome, changing, or new skin lesions.  8. Osteoporosis screening at 89- osteopenia 04/2016- likely 2-3 year repeat. Still taking calcium-vitamin D  Diabetic Foot Exam - Simple   Simple Foot Form Diabetic Foot exam was performed with the following findings:  Yes 05/23/2017  8:34 AM  Visual Inspection No deformities, no ulcerations, no other skin breakdown bilaterally:  Yes Sensation Testing Intact to touch and monofilament testing bilaterally:  Yes Pulse Check Posterior Tibialis and Dorsalis pulse intact bilaterally:  Yes Comments    Status of chronic or acute concerns  Chronic diastolic heart failure- stable on lasix 81m prn- has not had to use lately. Does still take potassium daily- discussed likely should just be prn but she wants to discuss with cardiology. Will only reduce if potassium elevated or high normal.   DM II controlled- remains on metformin 5084m BID. Update a1c Lab Results  Component Value Date   HGBA1C 6.7 (H) 01/02/2017   S.p AVR with bioprosthetic valve- doing well after surgery. Has dental prophylaxis  Hyperlipidemia- remained on atorvastatin 4032mn Tuesday and friday- update lipids. Goal LDL at least under 100  HTN- mainly on meds for cardiac purposes- after valve replacement on metoprolol 12.5mg25montinues lasix 40mg60mly  Morbid obesity- BMI >35 with HLD and DM. Discussed continued efforts with weight loss. Would advise at least 5 lbs over next 4 months. Healthy eating, regular exercise  GAD- controlled on effexor 150mg 68menlafaxine. Fidgets some still.   Migraines- sumatriptan once a week still . Some weeks can be far worse , others better  Watch alk phos level- consider GGT if >2 x ULN  OSA on cpap- compliant   Asthma- mild - not having to use albuterol. Usually as long as controls allergies- on clariti nD, astelin, nasonex, and saline - main issues have been sinus related- CT of sinuses on monday.   Low back pain/DJD- uses relafen at night- knows of some risk. BP ok- using less lasix- think nsaids ok for now  GERD_ has been told by Dr. Perry Henrene Pastorke 40mg s81me wants to continue this dose.    Future Appointments  Date Time Provider [email or web address]  3:40 PM GI-315 CT 1 GI-315CT [facility] [facility]  08/18/2017 11:30 AM [place], CRexene AlbertsTS-[facility] TCTSG   6 months  Orders Placed This Encounter  Procedures  . Hemoglobin A1c    [place]  . Microalbumin / creatinine urine ratio    [place]  . Lipid panel    [place]    Order Specific Question:   Has the patient  fasted?    Answer:   No  . Comprehensive metabolic panel    [place]    Order Specific Question:   Has the patient fasted?    Answer:   No   Return precautions advised.  Garret Reddish, MD

## 2017-05-23 NOTE — Addendum Note (Signed)
Addended by: Lyndle Herrlich on: 05/23/2017 08:51 AM   Modules accepted: Orders

## 2017-05-23 NOTE — Patient Instructions (Addendum)
Start to work on 5 lbs weight loss at least within the next 6 months- hopefully you can get your sinuses cleared up with ENT help so it will help you with feeling like you can be more active  Final pneumonia shot given today- thanks!   If you can find new shingles vaccination (shingrix) at pharmacy- then you can certainly get that- 11% effective compared to old vaccine 50%  Please stop by lab before you go

## 2017-05-26 ENCOUNTER — Ambulatory Visit
Admission: RE | Admit: 2017-05-26 | Discharge: 2017-05-26 | Disposition: A | Discharge status: Home / Self Care | Payer: Medicare Other | Source: Ambulatory Visit | Attending: Otolaryngology | Admitting: Otolaryngology

## 2017-05-26 DIAGNOSIS — J329 Chronic sinusitis, unspecified: Secondary | ICD-10-CM

## 2017-05-30 ENCOUNTER — Encounter: Payer: Self-pay | Admitting: Family Medicine

## 2017-06-12 ENCOUNTER — Other Ambulatory Visit: Payer: Self-pay

## 2017-06-12 MED ORDER — METFORMIN HCL 500 MG PO TABS: ORAL_TABLET | ORAL | 1 refills | Status: DC

## 2017-06-12 MED ORDER — VENLAFAXINE HCL ER 150 MG PO CP24: ORAL_CAPSULE | ORAL | 1 refills | Status: DC

## 2017-08-12 ENCOUNTER — Ambulatory Visit: Payer: Medicare Other | Admitting: Family Medicine

## 2017-08-12 ENCOUNTER — Encounter: Payer: Self-pay | Admitting: Family Medicine

## 2017-08-12 ENCOUNTER — Ambulatory Visit (INDEPENDENT_AMBULATORY_CARE_PROVIDER_SITE_OTHER)
Admission: RE | Admit: 2017-08-12 | Discharge: 2017-08-12 | Disposition: A | Discharge status: Home / Self Care | Payer: Medicare Other | Source: Ambulatory Visit | Attending: Family Medicine | Admitting: Family Medicine

## 2017-08-12 VITALS — BP 132/70 | HR 62 | Temp 99.0°F | Ht 65.5 in | Wt 229.2 lb

## 2017-08-12 DIAGNOSIS — R51 Headache: Secondary | ICD-10-CM

## 2017-08-12 DIAGNOSIS — R509 Fever, unspecified: Secondary | ICD-10-CM | POA: Diagnosis not present

## 2017-08-12 DIAGNOSIS — R519 Headache, unspecified: Secondary | ICD-10-CM

## 2017-08-12 DIAGNOSIS — G43109 Migraine with aura, not intractable, without status migrainosus: Secondary | ICD-10-CM

## 2017-08-12 DIAGNOSIS — Z953 Presence of xenogenic heart valve: Secondary | ICD-10-CM

## 2017-08-12 DIAGNOSIS — K219 Gastro-esophageal reflux disease without esophagitis: Secondary | ICD-10-CM | POA: Diagnosis not present

## 2017-08-12 DIAGNOSIS — E785 Hyperlipidemia, unspecified: Secondary | ICD-10-CM

## 2017-08-12 DIAGNOSIS — G8929 Other chronic pain: Secondary | ICD-10-CM | POA: Diagnosis not present

## 2017-08-12 DIAGNOSIS — I63331 Cerebral infarction due to thrombosis of right posterior cerebral artery: Secondary | ICD-10-CM | POA: Diagnosis not present

## 2017-08-12 DIAGNOSIS — M5442 Lumbago with sciatica, left side: Secondary | ICD-10-CM

## 2017-08-12 LAB — POCT INFLUENZA A/B
Influenza A, POC: NEGATIVE
Influenza B, POC: NEGATIVE

## 2017-08-12 MED ORDER — PREDNISONE 20 MG PO TABS: ORAL_TABLET | ORAL | 0 refills | Status: DC

## 2017-08-12 MED ORDER — CLOPIDOGREL BISULFATE 75 MG PO TABS: 75.0000 mg | ORAL_TABLET | Freq: Every day | ORAL | 3 refills | Status: DC

## 2017-08-12 MED ORDER — ATORVASTATIN CALCIUM 40 MG PO TABS: 40.0000 mg | ORAL_TABLET | Freq: Every day | ORAL | 3 refills | Status: DC

## 2017-08-12 NOTE — Progress Notes (Signed)
Subjective:  [name] is a 67 y.o. year old very pleasant female patient who presents for/with See problem oriented charting ROS- No facial or extremity weakness. No slurred words or trouble swallowing. has blurry vision. No  double vision. No paresthesias. No confusion or word finding difficulties. A complete review of systems was completed and was neagative except for headache and "aura"/blurry vision  The following were reviewed and entered/updated in epic: Past Medical History:  Diagnosis Date  . Allergy   . Anxiety   . Arthritis    back & knee  . Asthma     mild per pt shows up with resp illness  . Chronic diastolic congestive heart failure ([place])   . Colon polyps   . Diabetes mellitus without complication ([place])   . Gastric polyps   . Gastroparesis   . GERD (gastroesophageal reflux disease)   . Headache    sinus headaches and migraines at times  . Heart murmur   . History of migraine headaches   . Hyperlipidemia   . IBS (irritable bowel syndrome)   . Lumbar disc disease   . S/P aortic valve replacement with bioprosthetic valve 08/23/2016   25 mm Edwards Intuity rapid-deployment bovine pericardial tissue valve via partial upper mini sternotomy  . Sleep apnea    Patient Active Problem List   Diagnosis Date Noted  . S/P minimally invasive aortic valve replacement with bioprosthetic valve 08/23/2016    Priority: High  . Chronic diastolic congestive heart failure ([place])     Priority: High  . Severe aortic stenosis 07/08/2016    Priority: High  . Diabetes mellitus type 2, controlled ([place]) 05/12/2014    Priority: High  . Hypertension 01/02/2017    Priority: Medium  . Osteopenia 05/27/2016    Priority: Medium  . OSA (obstructive sleep apnea) 02/27/2016    Priority: Medium  . Elevated alkaline phosphatase level 05/16/2015    Priority: Medium  . GERD (gastroesophageal reflux disease) 03/10/2014    Priority: Medium  . Generalized anxiety disorder 03/10/2014     Priority: Medium  . Migraines 03/10/2014    Priority: Medium  . Irritable bowel syndrome 12/15/2008    Priority: Medium  . OBESITY, MORBID 10/12/2008    Priority: Medium  . Hyperlipidemia 02/09/2008    Priority: Medium  . Bruxism 10/23/2015    Priority: Low  . Atypical chest pain 11/10/2014    Priority: Low  . Acne 05/12/2014    Priority: Low  . EUSTACHIAN TUBE DYSFUNCTION, CHRONIC 06/14/2010    Priority: Low  . Low back pain 08/11/2008    Priority: Low  . Allergic rhinitis 02/12/2007    Priority: Low  . ASTHMA 02/12/2007    Priority: Low   Past Surgical History:  Procedure Laterality Date  . ABDOMINAL HYSTERECTOMY    . AORTIC VALVE REPLACEMENT N/A 08/23/2016   Procedure: AORTIC VALVE REPLACEMENT (AVR) - using partial Upper Sternotomy- 54m Edwards Intuity Aortic Valve used;  Surgeon: CRexene Alberts MD;  [place] [facility]  Service: Open Heart Surgery;  Laterality: N/A;  . BLADDER SUSPENSION  2007  . BREAST ENHANCEMENT SURGERY  1980  . CARPAL TUNNEL RELEASE Bilateral   . COLONOSCOPY    . DORSAL COMPARTMENT RELEASE Right 09/15/2014   Procedure: RIGHT WRIST DEQUERVAINS RELEASE ;  Surgeon: DKathryne Hitch MD;  [place] [facility]  Service: Orthopedics;  Laterality: Right;  . ESOPHAGOGASTRODUODENOSCOPY    . KNEE ARTHROSCOPY  04/15/2012   Procedure: ARTHROSCOPY KNEE;  Surgeon: DNestor Ramp  Percell Miller, MD;  Location: [place];  Service: Orthopedics;  Laterality: Right;  . LAPAROSCOPIC CHOLECYSTECTOMY    . LASIK    . OOPHORECTOMY    . PALATE TO GINGIVA GRAFT  2017  . RIGHT/LEFT HEART CATH AND CORONARY ANGIOGRAPHY N/A 08/02/2016   Procedure: Right/Left Heart Cath and Coronary Angiography;  Surgeon: Sherren Mocha, MD;  Location: [facility] [facility] [facility];  Service: Cardiovascular;  Laterality: N/A;  . ROTATOR CUFF REPAIR Left   . TEE WITHOUT CARDIOVERSION N/A 08/23/2016   Procedure: TRANSESOPHAGEAL ECHOCARDIOGRAM (TEE);  Surgeon: Rexene Alberts, MD;  Location: [place];  Service: Open  Heart Surgery;  Laterality: N/A;  . TOTAL KNEE ARTHROPLASTY  04/15/2012   Procedure: TOTAL KNEE ARTHROPLASTY;  Surgeon: [name], MD;  Location: [place];  Service: Orthopedics;  Laterality: Right;  . TRIGGER FINGER RELEASE Right 04/20/2015   Procedure: RIGHT TRIGGER FINGER RELEASE (TENDON SHEALTH INCISION) ;  Surgeon: [name], MD;  Location: [place];  Service: Orthopedics;  Laterality: Right;    Family History  Problem Relation Age of Onset  . COPD Mother   . Colon polyps Mother   . Irritable bowel syndrome Mother   . Heart disease Father   . Alcohol abuse Father   . Colon polyps Maternal Aunt    Allergies-reviewed and updated Allergies  Allergen Reactions  . Augmentin [Amoxicillin-Pot Clavulanate] Rash    rash  . Erythromycin Nausea Only  . Penicillins Itching and Rash    Has patient had a PCN reaction causing immediate rash, facial/tongue/throat swelling, SOB or lightheadedness with hypotension: No Has patient had a PCN reaction causing severe rash involving mucus membranes or skin necrosis: No Has patient had a PCN reaction that required hospitalization: No Has patient had a PCN reaction occurring within the last 10 years: No  If all of the above answers are "NO", then may proceed with Cephalosporin use.     Social History   Social History Narrative   She lives with husband (1978) and two dogs. Step-son [name] (1971-psychiatrist in [place]). No grandkids. 2 dogs-sheltie/collie mix and border collie mix      Highest level of education:  Master in education   She is retired Animal nutritionist x 32 years.      Hobbies: time with dogs             Medications- reviewed and updated Current Outpatient Medications  Medication Sig Dispense Refill  . aspirin EC 81 MG tablet Take 1 tablet (81 mg total) by mouth daily. 90 tablet 3  . atorvastatin (LIPITOR) 40 MG tablet TAKE 1 TABLET BY MOUTH AT NIGHT ON TUESDAY AND FRIDAY 30 tablet 6  .  Biotin 5000 MCG TABS Take 5,000 mcg by mouth daily.     . Blood Glucose Monitoring Suppl (ONETOUCH VERIO) W/DEVICE KIT 1 kit by Does not apply route once. 1 kit 0  . calcium-vitamin D (OSCAL WITH D) 500-200 MG-UNIT per tablet Take 2 tablets by mouth daily.     . clindamycin (CLEOCIN) 300 MG capsule Take 2 capsules (600 mg total) by mouth 1 hour prior too dental cleaning. 2 capsule 0  . fluticasone (FLONASE) 50 MCG/ACT nasal spray Place 2 sprays into both nostrils daily.     . furosemide (LASIX) 40 MG tablet Take 1 tablet (40 mg total) by mouth daily. 60 tablet 11  . glucose blood (ONETOUCH VERIO) test strip Use to test blood sugars daily. Dx: E11.9 100 each 12  . ipratropium (  ATROVENT) 0.03 % nasal spray Place 2 sprays into both nostrils 2 (two) times daily.   3  . Lancets (ONETOUCH ULTRASOFT) lancets Use to test blood sugars daily. Dx: E11.9 100 each 12  . loratadine (CLARITIN) 10 MG tablet Take 10 mg by mouth at bedtime.     . metFORMIN (GLUCOPHAGE) 500 MG tablet TAKE 1 TABLET WITH BREAKFAST AND 1/2 TO 1 TABLET WITH DINNER. 180 tablet 1  . metoCLOPramide (REGLAN) 10 MG tablet TAKE 1/2 TABLET BY MOUTH 4 TIMES A DAY 60 tablet 5  . metoprolol tartrate (LOPRESSOR) 25 MG tablet Take 0.5 tablets (12.5 mg total) by mouth 2 (two) times daily. 60 tablet 11  . Multiple Vitamin (MULTIVITAMIN WITH MINERALS) TABS tablet Take 1 tablet by mouth daily.    . nabumetone (RELAFEN) 750 MG tablet TAKE 2 TABLETS BY MOUTH EVERY DAY 60 tablet 5  . omeprazole (PRILOSEC OTC) 20 MG tablet Take 40 mg by mouth daily with breakfast.    . potassium chloride SA (K-DUR,KLOR-CON) 20 MEQ tablet Take 1 tablet (20 mEq total) by mouth daily. 60 tablet 11  . Probiotic Product (ALIGN) 4 MG CAPS Take 4 mg by mouth daily.     . psyllium (METAMUCIL SMOOTH TEXTURE) 28 % packet Take 1 packet by mouth daily before breakfast.     . SUMAtriptan (IMITREX) 100 MG tablet Take 1 tablet (100 mg total) by mouth every 2 (two) hours as needed. TAKE 1  TABLET BY MOUTH EVERY 2 HOURS AS NEEDED FOR MIGRAINE. DO NOT EXCEED 2 DOSES IN 24 HOURS. 10 tablet 3  . venlafaxine XR (EFFEXOR-XR) 150 MG 24 hr capsule TAKE ONE CAPSULE BY MOUTH DAILY WITH BREAKFAST 90 capsule 1    Objective: BP 132/70   Pulse 62   Temp 99 F (37.2 C) (Oral)   Ht 5' 5.5" (1.664 m)   Wt 229 lb 3.2 oz (104 kg)   SpO2 99%   BMI 37.56 kg/m  Gen: NAD, resting comfortably CV: RRR systolic murmur noted Lungs: CTAB no crackles, wheeze, rhonchi Abdomen: soft/nontender/nondistended/normal bowel sounds.  Ext: no edema Skin: warm, dry Neuro: CN II-XII intact, sensation and reflexes normal throughout, 5/5 muscle strength in bilateral upper and lower extremities. Normal finger to nose. Normal rapid alternating movements. No pronator drift. Normal romberg. Normal gait.    Visual Acuity Screening   Right eye Left eye Both eyes  Without correction:     With correction: _0   Ct Head Wo Contrast  Addendum Date: 08/12/2017   ADDENDUM REPORT: 08/12/2017 15:41 ADDENDUM: These results were called by telephone on 08/12/2017 at 3:37 pm to Dr. Garret Reddish , who verbally acknowledged these results. Electronically Signed   By: Titus Dubin M.D.   On: 08/12/2017 15:41   Result Date: 08/12/2017 CLINICAL DATA:  Acute, intractable headache. Blurry vision. History of migraines. EXAM: CT HEAD WITHOUT CONTRAST TECHNIQUE: Contiguous axial images were obtained from the base of the skull through the vertex without intravenous contrast. COMPARISON:  None. FINDINGS: Brain: Small patchy hypodensity within the right occipital lobe. No evidence of hemorrhage, hydrocephalus, extra-axial collection or mass lesion/mass effect. Mild age related cerebral atrophy. Vascular: No hyperdense vessel or unexpected calcification. Skull: Normal. Negative for fracture or focal lesion. Sinuses/Orbits: No acute finding. Other: None. IMPRESSION: 1. Small, patchy hypodensity in the right occipital lobe,  concerning for subacute infarct. Radiology assistant personnel have been notified to put me in telephone contact with the referring physician or the referring physician's clinical representative in  order to discuss these findings. Once this communication is established I will issue an addendum to this report for documentation purposes. Electronically Signed: By: Titus Dubin M.D. On: 08/12/2017 15:24   Results for orders placed or performed in visit on 08/12/17 (from the past 24 hour(s))  POCT Influenza A/B     Status: None   Collection Time: 08/12/17  1:56 PM  Result Value Ref Range   Influenza A, POC Negative Negative   Influenza B, POC Negative Negative   Assessment/Plan:  Fever, unspecified fever cause - Plan: POCT Influenza A/B (please note- this should have been entered under headache- patient denied fever) Acute intractable headache, unspecified headache type - Plan: CT Head Wo Contrast, POCT Influenza A/B Occipital subacute stroke S: she is on day 5 of a migraine. Usually gets aura and 45 minutes later takes imitex and then aura goes away and gets nausea/headache and takes second imitrex and gets some rest and headache goes away.   Aura has lasted for 5 days- with her aura gets little splotches- that has persisted for entire. Sumatriptan did not help. - has taken everyday of the headache.   Wakes up each AM and thinks better then gets worse. Pain is located on right side of forehead mainly but ca be on left as well A/P: 67 year old with DM, HTN, HLD, bovine aortic valve not needing anticoagulation but on aspirin at baseline who presented with prolonged migraine with prolonged perceived aura. Did CT scan to more quickly evaluate for stroke or bleed- unfortunately a subacute stroke in occipital lobe on right side was found - will get MRI/MRA without contrast -get updated echocardiogram. Would need anticoagulation if positive for thrombus -get carotid duplex evaluation - start plavix  75 mg daily- stop aspirin - refer to neurolgoy  -Advised to stop relafen due to acute CVA, also to stop imitrex- can discuss alternates with neurology -Flu test was negative (done by nursing staff due to prolonged headache)  Migraines Sumatriptan- stop due to stroke - had planned on possible toradol if no acute bleed and then prednisone. Told patient she could try the prednisone but without abortive therapy not sure it will help.   Hyperlipidemia Increase atorvastatin to daily from twice a week with LDL target under 70.   Low back pain Advised to stop relafen due to acute CVA  GERD (gastroesophageal reflux disease) Omeprazole given history of strictures- should ideally be off this with plavix- will discuss possible zantac trial with patient tomorrow.    Future Appointments  Date Time Provider [place]  08/13/2017 10:00 AM [name], MD [facility]  08/18/2017 11:30 AM Rexene Alberts, MD [facility]  11/25/2017 11:00 AM [name], MD [facility]  follow up tomorrow  Lab/Order associations: Fever, unspecified fever cause - Plan: POCT Influenza A/B  Acute intractable headache, unspecified headache type - Plan: CT Head Wo Contrast, POCT Influenza A/B  Meds ordered this encounter  Medications  . predniSONE (DELTASONE) 20 MG tablet    Sig: Take 1 tablet by mouth daily for 5 days, then 1/2 tablet daily for 2 days    Dispense:  6 tablet    Refill:  0    Return precautions advised.  Garret Reddish, MD

## 2017-08-12 NOTE — Assessment & Plan Note (Signed)
Omeprazole given history of strictures- should ideally be off this with plavix- will discuss possible zantac trial with patient tomorrow.

## 2017-08-12 NOTE — Assessment & Plan Note (Signed)
Increase atorvastatin to daily from twice a week with LDL target under 70.

## 2017-08-12 NOTE — Assessment & Plan Note (Addendum)
Sumatriptan- stop due to stroke - had planned on possible toradol if no acute bleed and then prednisone. Told patient she could try the prednisone but without abortive therapy not sure it will help.

## 2017-08-12 NOTE — Patient Instructions (Addendum)
Trying to get STAT CT for today of your head to rule out bleeding given irregular migraine  If negative- we can have you back for a toradol injection 60mg  and then I would put you on prednisone taper- do not take the prednisone taper yet but I sent this in

## 2017-08-12 NOTE — Assessment & Plan Note (Signed)
Advised to stop relafen due to acute CVA

## 2017-08-13 ENCOUNTER — Telehealth: Payer: Self-pay

## 2017-08-13 ENCOUNTER — Encounter: Payer: Self-pay | Admitting: Family Medicine

## 2017-08-13 ENCOUNTER — Ambulatory Visit: Payer: Medicare Other | Admitting: Family Medicine

## 2017-08-13 ENCOUNTER — Encounter: Payer: Self-pay | Admitting: Neurology

## 2017-08-13 VITALS — BP 131/80 | HR 72 | Temp 98.8°F | Ht 65.5 in | Wt 226.8 lb

## 2017-08-13 DIAGNOSIS — Z8673 Personal history of transient ischemic attack (TIA), and cerebral infarction without residual deficits: Secondary | ICD-10-CM

## 2017-08-13 DIAGNOSIS — H538 Other visual disturbances: Secondary | ICD-10-CM

## 2017-08-13 DIAGNOSIS — I4891 Unspecified atrial fibrillation: Secondary | ICD-10-CM

## 2017-08-13 NOTE — Assessment & Plan Note (Signed)
S: Patient presented yesterday with prolonged blurry vision thought to be long aura as she was having prolonged migraine of 5 days. CT scan showed occipital stroke. We discussed by phone next steps but she wanted to reiterate and ask questions today. We went through her plan and answered questions to the best of my ability.  Patient states her headache has since resolved but the blurry vision remains today.  Her vision was 20/30 yesterday which she thinks is her baseline.  She is afraid to drive because of it though. A/P: Still pending MRI/MRA, echocardiogram, carotid duplex.  Pending neurology evaluation.  We reviewed instructions from yesterday.  We spent the majority of the visit in counseling about reasons for planned evaluations and medication adjustments.  Poor quality EKG did not show any atrial fibrillation.  Patient would like for me to message her cardiologist Dr. Meda Coffee to see if she thinks the valve replacement may have contributed "She will increase atorvastatin to daily.  Start plavix 75mg  daily Stop aspirin Stop imitrex and relafen"

## 2017-08-13 NOTE — Progress Notes (Signed)
Please start her on 30 day event monitor to evaluate for a possible a-fib

## 2017-08-13 NOTE — Telephone Encounter (Addendum)
Per Dr. Meda Coffee, patient needs a 30 day cardiac event monitor to evaluate for possible A. FIB after having a CVA.  Will send message to scheduling to call patient to make an appointment for monitor. Patient is aware someone will be call her.

## 2017-08-13 NOTE — Patient Instructions (Signed)
Thanks for  1.  increasing atorvastatin to daily.  2. Starting plavix 75mg  daily 3. Stopping aspirin, imitrex and relafen  We will be in contact about other visits that are needed. Let us know immediately if worsening symptoms. No atrial fibrillation on EKG - I will also reach out to cardiology for their opinion- they may just want to wait until after neurology referral  Glad the headache is better todya

## 2017-08-13 NOTE — Progress Notes (Signed)
Subjective:  [name] is a 68 y.o. year old very pleasant female patient who presents for/with See problem oriented charting ROS- headache resolved this AM. Still with blurry vision like her aura. No chest pain or shortness of breath   Past Medical History-  Patient Active Problem List   Diagnosis Date Noted  . S/P minimally invasive aortic valve replacement with bioprosthetic valve 08/23/2016    Priority: High  . Chronic diastolic congestive heart failure ([place])     Priority: High  . Severe aortic stenosis 07/08/2016    Priority: High  . Diabetes mellitus type 2, controlled ([place]) 05/12/2014    Priority: High  . Hypertension 01/02/2017    Priority: Medium  . Osteopenia 05/27/2016    Priority: Medium  . OSA (obstructive sleep apnea) 02/27/2016    Priority: Medium  . Elevated alkaline phosphatase level 05/16/2015    Priority: Medium  . GERD (gastroesophageal reflux disease) 03/10/2014    Priority: Medium  . Generalized anxiety disorder 03/10/2014    Priority: Medium  . Migraines 03/10/2014    Priority: Medium  . Irritable bowel syndrome 12/15/2008    Priority: Medium  . OBESITY, MORBID 10/12/2008    Priority: Medium  . Hyperlipidemia 02/09/2008    Priority: Medium  . Bruxism 10/23/2015    Priority: Low  . Atypical chest pain 11/10/2014    Priority: Low  . Acne 05/12/2014    Priority: Low  . EUSTACHIAN TUBE DYSFUNCTION, CHRONIC 06/14/2010    Priority: Low  . Low back pain 08/11/2008    Priority: Low  . Allergic rhinitis 02/12/2007    Priority: Low  . ASTHMA 02/12/2007    Priority: Low  . History of CVA in adulthood 08/13/2017  . Blurry vision 08/13/2017    Medications- reviewed and updated Current Outpatient Medications  Medication Sig Dispense Refill  . atorvastatin (LIPITOR) 40 MG tablet Take 1 tablet (40 mg total) by mouth daily at 6 PM. 90 tablet 3  . Biotin 5000 MCG TABS Take 5,000 mcg by mouth daily.     . Blood Glucose Monitoring Suppl (ONETOUCH  VERIO) W/DEVICE KIT 1 kit by Does not apply route once. 1 kit 0  . calcium-vitamin D (OSCAL WITH D) 500-200 MG-UNIT per tablet Take 2 tablets by mouth daily.     . clindamycin (CLEOCIN) 300 MG capsule Take 2 capsules (600 mg total) by mouth 1 hour prior too dental cleaning. 2 capsule 0  . clopidogrel (PLAVIX) 75 MG tablet Take 1 tablet (75 mg total) by mouth daily. 90 tablet 3  . fluticasone (FLONASE) 50 MCG/ACT nasal spray Place 2 sprays into both nostrils daily.     . furosemide (LASIX) 40 MG tablet Take 1 tablet (40 mg total) by mouth daily. 60 tablet 11  . glucose blood (ONETOUCH VERIO) test strip Use to test blood sugars daily. Dx: E11.9 100 each 12  . Lancets (ONETOUCH ULTRASOFT) lancets Use to test blood sugars daily. Dx: E11.9 100 each 12  . loratadine (CLARITIN) 10 MG tablet Take 10 mg by mouth at bedtime.     . metFORMIN (GLUCOPHAGE) 500 MG tablet TAKE 1 TABLET WITH BREAKFAST AND 1/2 TO 1 TABLET WITH DINNER. 180 tablet 1  . metoCLOPramide (REGLAN) 10 MG tablet TAKE 1/2 TABLET BY MOUTH 4 TIMES A DAY 60 tablet 5  . metoprolol tartrate (LOPRESSOR) 25 MG tablet Take 0.5 tablets (12.5 mg total) by mouth 2 (two) times daily. 60 tablet 11  . Multiple Vitamin (MULTIVITAMIN WITH MINERALS) TABS tablet  Take 1 tablet by mouth daily.    [name] omeprazole (PRILOSEC OTC) 20 MG tablet Take 40 mg by mouth daily with breakfast.    . potassium chloride SA (K-DUR,KLOR-CON) 20 MEQ tablet Take 1 tablet (20 mEq total) by mouth daily. 60 tablet 11  . predniSONE (DELTASONE) 20 MG tablet Take 1 tablet by mouth daily for 5 days, then 1/2 tablet daily for 2 days 6 tablet 0  . Probiotic Product (ALIGN) 4 MG CAPS Take 4 mg by mouth daily.     . psyllium (METAMUCIL SMOOTH TEXTURE) 28 % packet Take 1 packet by mouth daily before breakfast.     . venlafaxine XR (EFFEXOR-XR) 150 MG 24 hr capsule TAKE ONE CAPSULE BY MOUTH DAILY WITH BREAKFAST 90 capsule 1   No current facility-administered medications for this visit.      Objective: BP 131/80 (BP Location: Left Arm, Patient Position: Sitting, Cuff Size: Large)   Pulse 72   Temp 98.8 F (37.1 C) (Oral)   Ht 5' 5.5" (1.664 m)   Wt 226 lb 12.8 oz (102.9 kg)   SpO2 98%   BMI 37.17 kg/m  Gen: NAD, resting comfortably CV: RRR faint murmur aortic area Lungs: CTAB no crackles, wheeze, rhonchi Abdomen: soft/nontender/nondistended/normal bowel sounds.obese  Ext: no edema Skin: warm, dry  EKG: Please note we are having difficulty with limb leads despite multiple attempts at correction.  The main reason we did an EKG was to rule out atrial fibrillation and per lead II there is no signs of atrial fibrillation.  She appears to be in sinus rhythm with rate of 69.  Suspected normal axis.  No ST or T wave changes in plans that are available to see.  Assessment/Plan:  History of CVA in adulthood/ blurry vision S: Patient presented yesterday with prolonged blurry vision thought to be long aura as she was having prolonged migraine of 5 days. CT scan showed occipital stroke. We discussed by phone next steps but she wanted to reiterate and ask questions today. We went through her plan and answered questions to the best of my ability.  Patient states her headache has since resolved but the blurry vision remains today.  Her vision was 20/30 yesterday which she thinks is her baseline.  She is afraid to drive because of it though. A/P: Still pending MRI/MRA, echocardiogram, carotid duplex.  Pending neurology evaluation.  We reviewed instructions from yesterday.  We spent the majority of the visit in counseling about reasons for planned evaluations and medication adjustments.  Poor quality EKG did not show any atrial fibrillation.  Patient would like for me to message her cardiologist Dr. Meda Coffee to see if she thinks the valve replacement may have contributed "She will increase atorvastatin to daily.  Start plavix 66m daily Stop aspirin Stop imitrex and relafen"  Future  Appointments  Date Time Provider [place] 08/18/2017 11:30 AM ORexene Alberts MD [facility]  08/19/2017 12:00 PM [facility]  08/27/2017  9:00 AM JPieter Partridge DO [facility] None  11/25/2017 11:00 AM HMarin Olp MD [facility]   Time Stamp The duration of face-to-face time during this visit was greater than 15 minutes. Greater than 50% of this time was spent in counseling, explanation of diagnosis, planning of further management, and/or coordination of care including discussion of reasons for workup and follow up steps/indications and reasons for medication adjustments. .    Return precautions advised.  SGarret Reddish MD

## 2017-08-14 NOTE — Telephone Encounter (Signed)
Pts event monitor is scheduled for 08/26/17 at 1 pm.  Pt made aware of appt date and time by [facility] scheduling.

## 2017-08-18 ENCOUNTER — Encounter: Payer: Self-pay | Admitting: Thoracic Surgery (Cardiothoracic Vascular Surgery)

## 2017-08-18 ENCOUNTER — Ambulatory Visit: Payer: Medicare Other | Admitting: Thoracic Surgery (Cardiothoracic Vascular Surgery)

## 2017-08-18 ENCOUNTER — Other Ambulatory Visit: Payer: Self-pay

## 2017-08-18 VITALS — BP 160/74 | HR 67 | Resp 18 | Ht 65.5 in | Wt 230.4 lb

## 2017-08-18 DIAGNOSIS — Z953 Presence of xenogenic heart valve: Secondary | ICD-10-CM | POA: Diagnosis not present

## 2017-08-18 NOTE — Patient Instructions (Signed)

## 2017-08-18 NOTE — Progress Notes (Signed)
[address]       [place] [address]             [phone]     CARDIOTHORACIC SURGERY OFFICE NOTE  Referring Provider is Dorothy Spark, MD PCP is [name], MD   HPI:  Patient is a 67 year old obese white female with history of aortic stenosis, atypical chest pain, obstructive sleep apnea, GE reflux disease, and type 2 diabetes mellitus who returns to the office today for routine follow-up status post aortic valve replacement using a rapid deployment bovine pericardial bioprosthetic tissue valve via partial upper mini sternotomy on 08/23/2016 for severe symptomatic aortic stenosis.  Her early postoperative recovery was uneventful and she was last seen here in our office on Nov 11, 2016 at which time she was doing well.  She continued to do very well from a cardiac standpoint and was seen more recently by Dr. Meda Coffee on April 24, 2017.  At that time she was doing very well and follow-up echocardiogram revealed normal left ventricular systolic function with mildly functioning bioprosthetic tissue valve in aortic position.  There was felt to be trivial paravalvular leak.  Mean transvalvular gradient was estimated only 6 mmHg.  She returns to the office today and reports that she continues to do very well.  Her surgical incision healed uneventfully and she states that her exercise tolerance is improved and overall she feels much improved in comparison with how she felt prior to surgery.  She states that she no longer has any exertional shortness of breath or chest discomfort.  She is limited primarily by problems with her lower back and her previous stroke.   Current Outpatient Medications  Medication Sig Dispense Refill  . atorvastatin (LIPITOR) 40 MG tablet Take 1 tablet (40 mg total) by mouth daily at 6 PM. 90 tablet 3  . Biotin 5000 MCG TABS Take 5,000 mcg by mouth daily.     . Blood Glucose Monitoring Suppl (ONETOUCH VERIO) W/DEVICE KIT 1 kit by Does  not apply route once. 1 kit 0  . calcium-vitamin D (OSCAL WITH D) 500-200 MG-UNIT per tablet Take 2 tablets by mouth daily.     . clindamycin (CLEOCIN) 300 MG capsule Take 2 capsules (600 mg total) by mouth 1 hour prior too dental cleaning. 2 capsule 0  . clopidogrel (PLAVIX) 75 MG tablet Take 1 tablet (75 mg total) by mouth daily. 90 tablet 3  . fluticasone (FLONASE) 50 MCG/ACT nasal spray Place 2 sprays into both nostrils daily.     . furosemide (LASIX) 40 MG tablet Take 1 tablet (40 mg total) by mouth daily. 60 tablet 11  . glucose blood (ONETOUCH VERIO) test strip Use to test blood sugars daily. Dx: E11.9 100 each 12  . Lancets (ONETOUCH ULTRASOFT) lancets Use to test blood sugars daily. Dx: E11.9 100 each 12  . loratadine (CLARITIN) 10 MG tablet Take 10 mg by mouth at bedtime.     . metFORMIN (GLUCOPHAGE) 500 MG tablet TAKE 1 TABLET WITH BREAKFAST AND 1/2 TO 1 TABLET WITH DINNER. 180 tablet 1  . metoCLOPramide (REGLAN) 10 MG tablet TAKE 1/2 TABLET BY MOUTH 4 TIMES A DAY 60 tablet 5  . metoprolol tartrate (LOPRESSOR) 25 MG tablet Take 0.5 tablets (12.5 mg total) by mouth 2 (two) times daily. 60 tablet 11  . Multiple Vitamin (MULTIVITAMIN WITH MINERALS) TABS tablet Take 1 tablet by mouth daily.    [name] omeprazole (PRILOSEC OTC) 20 MG tablet  Take 40 mg by mouth daily with breakfast.    . potassium chloride SA (K-DUR,KLOR-CON) 20 MEQ tablet Take 1 tablet (20 mEq total) by mouth daily. 60 tablet 11  . predniSONE (DELTASONE) 20 MG tablet Take 1 tablet by mouth daily for 5 days, then 1/2 tablet daily for 2 days 6 tablet 0  . Probiotic Product (ALIGN) 4 MG CAPS Take 4 mg by mouth daily.     . psyllium (METAMUCIL SMOOTH TEXTURE) 28 % packet Take 1 packet by mouth daily before breakfast.     . venlafaxine XR (EFFEXOR-XR) 150 MG 24 hr capsule TAKE ONE CAPSULE BY MOUTH DAILY WITH BREAKFAST 90 capsule 1   No current facility-administered medications for this visit.       Physical Exam:   BP (!) 160/74  (BP Location: Left Arm, Patient Position: Sitting, Cuff Size: Large)   Pulse 67   Resp 18   Ht 5' 5.5" (1.664 m)   Wt 230 lb 6.4 oz (104.5 kg)   SpO2 99% Comment: RA  BMI 37.76 kg/m   General:  Obese but well-appearing  Chest:   Clear to auscultation  CV:   Regular rate and rhythm without murmur  Incisions:  Completely healed  Abdomen:  Soft nontender  Extremities:  Warm and well perfused  Diagnostic Tests:  Transthoracic Echocardiography  Patient:    Jahni, Paul MR #:       [id number] Study Date: 05/01/2017 Gender:     F Age:        48 Height:     166.4 cm Weight:     103.9 kg BSA:        2.24 m^2 Pt. Status: Room:   ATTENDING    Loralie Champagne, M.D.  SONOGRAPHER  [place], RDCS  ORDERING     Ena Dawley, M.D.  REFERRING    Ena Dawley, M.D.  PERFORMING   [facility], Outpatient  cc:  ------------------------------------------------------------------- LV EF: 60% -   65%  ------------------------------------------------------------------- Indications:      AVR (Z95.3).  ------------------------------------------------------------------- History:   PMH:   Congestive heart failure.  Aortic valve disease. Risk factors:  Hypertension. Diabetes mellitus. Morbidly obese. Dyslipidemia.  ------------------------------------------------------------------- Study Conclusions  - Left ventricle: The cavity size was normal. Wall thickness was   normal. Systolic function was normal. The estimated ejection   fraction was in the range of 60% to 65%. Wall motion was normal;   there were no regional wall motion abnormalities. Features are   consistent with a pseudonormal left ventricular filling pattern,   with concomitant abnormal relaxation and increased filling   pressure (grade 2 diastolic dysfunction). - Aortic valve: Bioprosthetic aortic valve. No significant   stenosis. There was trivial peri-valvular regurgitation, in   subcostal view it could be  mistaken for a small VSD. Mean   gradient (S): 6 mm Hg. Peak gradient (S): 12 mm Hg. - Mitral valve: There was trivial regurgitation. - Right ventricle: The cavity size was normal. Systolic function   was normal. - Pulmonary arteries: No complete TR doppler jet so unable to   estimate PA systolic pressure. - Inferior vena cava: The vessel was normal in size. The   respirophasic diameter changes were in the normal range (= 50%),   consistent with normal central venous pressure.  Impressions:  - Normal LV size with EF 60-65%. Moderate diastolic dysfunction.   Normal RV size and systolic function. S/p bioprosthetic aortic   valve replacement, no stenosis and trivial peri-valvular  regurgitation.  ------------------------------------------------------------------- Labs, prior tests, procedures, and surgery: Transthoracic echocardiography (07/05/2016).    The aortic valve showed severe stenosis.  EF was 55%. Aortic valve: peak gradient of 76 mm Hg and mean gradient of 43 mm Hg.  Valve surgery.     Aortic valve replacement with a bioprosthetic valve.  ------------------------------------------------------------------- Study data:  Comparison was made to the study of 07/05/2016.  Study status:  Routine.  Procedure:  The patient reported no pain pre or post test. Transthoracic echocardiography. Image quality was adequate.  Study completion:  There were no complications. Transthoracic echocardiography.  M-mode, complete 2D, spectral Doppler, and color Doppler.  Birthdate:  Patient birthdate: [birth year].  Age:  Patient is 67 yr old.  Sex:  Gender: female. BMI: 37.5 kg/m^2.  Blood pressure:     124/76  Patient status: Outpatient.  Study date:  Study date: 05/01/2017. Study time: 01:55 PM.  Location:  [facility]  -------------------------------------------------------------------  ------------------------------------------------------------------- Left ventricle:  The  cavity size was normal. Wall thickness was normal. Systolic function was normal. The estimated ejection fraction was in the range of 60% to 65%. Wall motion was normal; there were no regional wall motion abnormalities. Features are consistent with a pseudonormal left ventricular filling pattern, with concomitant abnormal relaxation and increased filling pressure (grade 2 diastolic dysfunction).  ------------------------------------------------------------------- Aortic valve:  Bioprosthetic aortic valve. No significant stenosis.  Doppler:  There was trivial peri-valvular regurgitation, in subcostal view it could be mistaken for a small VSD.    VTI ratio of LVOT to aortic valve: 0.61. Peak velocity ratio of LVOT to aortic valve: 0.61. Mean velocity ratio of LVOT to aortic valve: 0.58.    Mean gradient (S): 6 mm Hg. Peak gradient (S): 12 mm Hg.   ------------------------------------------------------------------- Aorta:  Aortic root: The aortic root was normal in size. Ascending aorta: The ascending aorta was normal in size.  ------------------------------------------------------------------- Mitral valve:   Normal thickness leaflets .  Doppler:   There was no evidence for stenosis.   There was trivial regurgitation.  ------------------------------------------------------------------- Left atrium:  The atrium was normal in size.  ------------------------------------------------------------------- Right ventricle:  The cavity size was normal. Systolic function was normal.  ------------------------------------------------------------------- Pulmonic valve:    Structurally normal valve.   Cusp separation was normal.  Doppler:  Transvalvular velocity was within the normal range. There was no regurgitation.  ------------------------------------------------------------------- Tricuspid valve:   Doppler:  There was trivial  regurgitation.  ------------------------------------------------------------------- Pulmonary artery:   No complete TR doppler jet so unable to estimate PA systolic pressure.  ------------------------------------------------------------------- Right atrium:  The atrium was normal in size.  ------------------------------------------------------------------- Pericardium:  There was no pericardial effusion.  ------------------------------------------------------------------- Systemic veins: Inferior vena cava: The vessel was normal in size. The respirophasic diameter changes were in the normal range (= 50%), consistent with normal central venous pressure.  ------------------------------------------------------------------- Measurements   Left ventricle                           Value        Reference  LV ID, ED, PLAX chordal                  45.1  mm     43 - 52  LV ID, ES, PLAX chordal                  29.4  mm     23 - 38  LV fx shortening, PLAX chordal  35    %      >=29  LV PW thickness, ED                      11.2  mm     ---------  IVS/LV PW ratio, ED                      0.84         <=1.3  LV e&', lateral                           11.8  cm/s   ---------  LV E/e&', lateral                         0.5          ---------  LV e&', medial                            5.7   cm/s   ---------  LV E/e&', medial                          1.04         ---------  LV e&', average                           8.75  cm/s   ---------  LV E/e&', average                         0.68         ---------    Ventricular septum                       Value        Reference  IVS thickness, ED                        9.42  mm     ---------    LVOT                                     Value        Reference  LVOT peak velocity, S                    103   cm/s   ---------  LVOT mean velocity, S                    67.7  cm/s   ---------  LVOT VTI, S                              26.4  cm      ---------    Aortic valve                             Value        Reference  Aortic valve peak velocity, S            170   cm/s   ---------  Aortic valve mean  velocity, S            116   cm/s   ---------  Aortic valve VTI, S                      43.3  cm     ---------  Aortic mean gradient, S                  6     mm Hg  ---------  Aortic peak gradient, S                  12    mm Hg  ---------  VTI ratio, LVOT/AV                       0.61         ---------  Velocity ratio, peak, LVOT/AV            0.61         ---------  Velocity ratio, mean, LVOT/AV            0.58         ---------  Aortic regurg pressure half-time         392   ms     ---------    Aorta                                    Value        Reference  Aortic root ID, ED                       34    mm     ---------    Left atrium                              Value        Reference  LA ID, A-P, ES                           47    mm     ---------  LA ID/bsa, A-P                           2.1   cm/m^2 <=2.2  LA volume, S                             43.2  ml     ---------  LA volume/bsa, S                         19.3  ml/m^2 ---------  LA volume, ES, 1-p A4C                   28.6  ml     ---------  LA volume/bsa, ES, 1-p A4C               12.8  ml/m^2 ---------  LA volume, ES, 1-p A2C                   55.3  ml     ---------  LA volume/bsa, ES, 1-p A2C  24.7  ml/m^2 ---------    Mitral valve                             Value        Reference  Mitral E-wave peak velocity              5.91  cm/s   ---------  Mitral A-wave peak velocity              61.3  cm/s   ---------  Mitral deceleration time                 218   ms     150 - 230  Mitral E/A ratio, peak                   0.9          ---------    Systemic veins                           Value        Reference  Estimated CVP                            3     mm Hg  ---------    Right ventricle                          Value        Reference  TAPSE                                     21.9  mm     ---------  Legend: (L)  and  (H)  mark values outside specified reference range.  ------------------------------------------------------------------- Prepared and Electronically Authenticated by  Loralie Champagne, M.D. 2018-11-08T18:01:05   Impression:  Patient is doing well approximately 1 year status post aortic valve replacement using a stented bovine pericardial tissue valve via partial upper mini sternotomy.  Routine follow-up echocardiogram performed 2 months ago revealed normal left ventricular systolic function and normal functioning bioprosthetic tissue valve in the aortic position.  Mean transvalvular gradient was quite low.  There was felt to be trivial perivalvular leak.  Plan:  We have not recommended any changes the patient's current medications.  I have encouraged the patient to continue to increase her activity without any limitations.  The patient has been reminded regarding the importance of dental hygiene and the lifelong need for antibiotic prophylaxis for all dental cleanings and other related invasive procedures.  She will continue to follow-up intermittently with Dr. Meda Coffee and return to our office in the future only should specific problems or questions arise.  I spent in excess of 15 minutes during the conduct of this office consultation and >50% of this time involved direct face-to-face encounter with the patient for counseling and/or coordination of their care.    Valentina Gu. Roxy Manns, MD 08/18/2017 11:46 AM

## 2017-08-19 ENCOUNTER — Ambulatory Visit (HOSPITAL_COMMUNITY)
Admission: RE | Admit: 2017-08-19 | Discharge: 2017-08-19 | Disposition: A | Discharge status: Home / Self Care | Payer: Medicare Other | Source: Ambulatory Visit | Attending: Cardiology | Admitting: Cardiology

## 2017-08-19 ENCOUNTER — Encounter: Payer: Self-pay | Admitting: Family Medicine

## 2017-08-19 DIAGNOSIS — I6523 Occlusion and stenosis of bilateral carotid arteries: Secondary | ICD-10-CM | POA: Diagnosis not present

## 2017-08-19 DIAGNOSIS — I63331 Cerebral infarction due to thrombosis of right posterior cerebral artery: Secondary | ICD-10-CM

## 2017-08-20 ENCOUNTER — Encounter: Payer: Self-pay | Admitting: Family Medicine

## 2017-08-21 ENCOUNTER — Ambulatory Visit
Admission: RE | Admit: 2017-08-21 | Discharge: 2017-08-21 | Disposition: A | Discharge status: Home / Self Care | Payer: Medicare Other | Source: Ambulatory Visit | Attending: Family Medicine | Admitting: Family Medicine

## 2017-08-21 DIAGNOSIS — I63331 Cerebral infarction due to thrombosis of right posterior cerebral artery: Secondary | ICD-10-CM

## 2017-08-25 ENCOUNTER — Encounter: Payer: Self-pay | Admitting: Family Medicine

## 2017-08-26 ENCOUNTER — Other Ambulatory Visit: Payer: Self-pay | Admitting: Cardiology

## 2017-08-26 ENCOUNTER — Other Ambulatory Visit: Payer: Self-pay

## 2017-08-26 ENCOUNTER — Ambulatory Visit (HOSPITAL_COMMUNITY): Payer: Medicare Other | Attending: Cardiovascular Disease

## 2017-08-26 ENCOUNTER — Ambulatory Visit (INDEPENDENT_AMBULATORY_CARE_PROVIDER_SITE_OTHER): Payer: Medicare Other

## 2017-08-26 DIAGNOSIS — I639 Cerebral infarction, unspecified: Secondary | ICD-10-CM

## 2017-08-26 DIAGNOSIS — R42 Dizziness and giddiness: Secondary | ICD-10-CM

## 2017-08-26 DIAGNOSIS — Z8673 Personal history of transient ischemic attack (TIA), and cerebral infarction without residual deficits: Secondary | ICD-10-CM | POA: Insufficient documentation

## 2017-08-26 DIAGNOSIS — Z952 Presence of prosthetic heart valve: Secondary | ICD-10-CM | POA: Insufficient documentation

## 2017-08-26 DIAGNOSIS — E119 Type 2 diabetes mellitus without complications: Secondary | ICD-10-CM | POA: Diagnosis not present

## 2017-08-26 DIAGNOSIS — I4891 Unspecified atrial fibrillation: Secondary | ICD-10-CM | POA: Diagnosis not present

## 2017-08-26 DIAGNOSIS — E785 Hyperlipidemia, unspecified: Secondary | ICD-10-CM | POA: Diagnosis not present

## 2017-08-26 DIAGNOSIS — I071 Rheumatic tricuspid insufficiency: Secondary | ICD-10-CM | POA: Diagnosis not present

## 2017-08-26 DIAGNOSIS — Z9889 Other specified postprocedural states: Secondary | ICD-10-CM | POA: Diagnosis not present

## 2017-08-26 DIAGNOSIS — I63331 Cerebral infarction due to thrombosis of right posterior cerebral artery: Secondary | ICD-10-CM

## 2017-08-27 ENCOUNTER — Encounter: Payer: Self-pay | Admitting: Family Medicine

## 2017-08-27 ENCOUNTER — Encounter: Payer: Self-pay | Admitting: Neurology

## 2017-08-27 ENCOUNTER — Ambulatory Visit: Payer: Medicare Other | Admitting: Neurology

## 2017-08-27 VITALS — BP 160/68 | HR 56 | Ht 66.0 in | Wt 232.0 lb

## 2017-08-27 DIAGNOSIS — G43609 Persistent migraine aura with cerebral infarction, not intractable, without status migrainosus: Secondary | ICD-10-CM | POA: Diagnosis not present

## 2017-08-27 DIAGNOSIS — I639 Cerebral infarction, unspecified: Secondary | ICD-10-CM

## 2017-08-27 DIAGNOSIS — E119 Type 2 diabetes mellitus without complications: Secondary | ICD-10-CM

## 2017-08-27 DIAGNOSIS — E785 Hyperlipidemia, unspecified: Secondary | ICD-10-CM

## 2017-08-27 DIAGNOSIS — I10 Essential (primary) hypertension: Secondary | ICD-10-CM

## 2017-08-27 NOTE — Progress Notes (Signed)
NEUROLOGY CONSULTATION NOTE  [name] MRN: [id number] DOB: 11/16/1950  Referring provider: Dr. Yong Channel Primary care provider: Dr. Yong Channel  Reason for consult:  stroke  HISTORY OF PRESENT ILLNESS: [name] is a 67 year old right-handed female with type 2 diabetes, hypertension, hyperlipidemia, status post bovine aortic valve and migraine who presents for stroke.  She is accompanied by her husband who supplements history.  History also is supplemented by PCP notes.  Ms. Kocher has history of migraines since high school.  They are left sided frontal throbbing headaches of moderate to severe intensity.  They typically last 3 hours and occur once a week.  They are preceded by an aura of spots in the vision of both eyes, lasting 2 hours.  They are aggravated by stress and relieved by rest.  She typically takes Imitrex, which is effective.  On 08/07/17, she developed one of her habitual migraines with aura.  However, the headache was more severe and the visual aura did not resolve.  The headache was severe for about 3 days and then tapered off after another 4 days.  The visual aura lasted up to a week.  She did not have any facial droop, unilateral numbness or weakness, ataxia or vertigo.  CT of head from 08/12/17 was personally reviewed and revealed a small patchy hypodensity in the right occipital lobe concerning for subacute infarct.  MRA of head from 08/21/17 was personally reviewed and revealed flow-limiting stenosis versus artifact in the left M2 segement but otherwise intracranial arteries patent, including right PCA.  Carotid doppler from 08/19/17 revealed no hemodynamically significant bilateral ICA stenosis and both vertebral arteries were patent with antegrade flow.  Echocardiogram from yesterday showed EF 60-65% with no cardiac source of emboli.  She is currently with heart monitor.  Her ASA 31m was switched to Plavix 7109mand she was advised to take atorvastatin daily.   She was advised to stop Imitrex.  Tylenol is ineffective.  She takes venlafaxine XR 15060maily for anxiety.  Labs from 05/23/17 include:  LDL 90, Hgb A1c 6.4   PAST MEDICAL HISTORY: Past Medical History:  Diagnosis Date  . Allergy   . Anxiety   . Arthritis    back & knee  . Asthma     mild per pt shows up with resp illness  . Chronic diastolic [facility]ive heart failure ([facility] . Colon polyps   . Diabetes mellitus without complication ([facility] . Gastric polyps   . Gastroparesis   . GERD (gastroesophageal reflux disease)   . Headache    sinus headaches and migraines at times  . Heart murmur   . History of migraine headaches   . Hyperlipidemia   . IBS (irritable bowel syndrome)   . Lumbar disc disease   . S/P aortic valve replacement with bioprosthetic valve 08/23/2016   25 mm Edwards Intuity rapid-deployment bovine pericardial tissue valve via partial upper mini sternotomy  . Sleep apnea     PAST SURGICAL HISTORY: Past Surgical History:  Procedure Laterality Date  . ABDOMINAL HYSTERECTOMY    . AORTIC VALVE REPLACEMENT N/A 08/23/2016   Procedure: AORTIC VALVE REPLACEMENT (AVR) - using partial Upper Sternotomy- 68m73mwards Intuity Aortic Valve used;  Surgeon: ClarRexene Alberts;  [facility];  Laterality: N/A;  . BLADDER SUSPENSION  2007  . BREAST ENHANCEMENT SURGERY  1980  . CARPAL TUNNEL RELEASE Bilateral   . COLONOSCOPY    . DORSAL  COMPARTMENT RELEASE Right 09/15/2014   Procedure: RIGHT WRIST DEQUERVAINS RELEASE ;  Surgeon: Kathryne Hitch, MD;  Location: [facility];  Service: Orthopedics;  Laterality: Right;  . ESOPHAGOGASTRODUODENOSCOPY    . KNEE ARTHROSCOPY  04/15/2012   Procedure: ARTHROSCOPY KNEE;  Surgeon: [name], MD;  Location: [facility];  Service: Orthopedics;  Laterality: Right;  . LAPAROSCOPIC CHOLECYSTECTOMY    . LASIK    . OOPHORECTOMY    . PALATE TO GINGIVA GRAFT  2017  . RIGHT/LEFT HEART CATH AND CORONARY  ANGIOGRAPHY N/A 08/02/2016   Procedure: Right/Left Heart Cath and Coronary Angiography;  Surgeon: Sherren Mocha, MD;  Location: [facility];  Service: Cardiovascular;  Laterality: N/A;  . ROTATOR CUFF REPAIR Left   . TEE WITHOUT CARDIOVERSION N/A 08/23/2016   Procedure: TRANSESOPHAGEAL ECHOCARDIOGRAM (TEE);  Surgeon: Rexene Alberts, MD;  Location: [facility];  Service: Open Heart Surgery;  Laterality: N/A;  . TOTAL KNEE ARTHROPLASTY  04/15/2012   Procedure: TOTAL KNEE ARTHROPLASTY;  Surgeon: [name], MD;  Location: [facility];  Service: Orthopedics;  Laterality: Right;  . TRIGGER FINGER RELEASE Right 04/20/2015   Procedure: RIGHT TRIGGER FINGER RELEASE (TENDON SHEALTH INCISION) ;  Surgeon: [name], MD;  Location: [facility];  Service: Orthopedics;  Laterality: Right;    MEDICATIONS: Current Outpatient Medications on File Prior to Visit  Medication Sig Dispense Refill  . atorvastatin (LIPITOR) 40 MG tablet Take 1 tablet (40 mg total) by mouth daily at 6 PM. 90 tablet 3  . Biotin 5000 MCG TABS Take 5,000 mcg by mouth daily.     . Blood Glucose Monitoring Suppl (ONETOUCH VERIO) W/DEVICE KIT 1 kit by Does not apply route once. 1 kit 0  . calcium-vitamin D (OSCAL WITH D) 500-200 MG-UNIT per tablet Take 2 tablets by mouth daily.     . clindamycin (CLEOCIN) 300 MG capsule Take 2 capsules (600 mg total) by mouth 1 hour prior too dental cleaning. 2 capsule 0  . clopidogrel (PLAVIX) 75 MG tablet Take 1 tablet (75 mg total) by mouth daily. 90 tablet 3  . fluticasone (FLONASE) 50 MCG/ACT nasal spray Place 2 sprays into both nostrils daily.     . furosemide (LASIX) 40 MG tablet Take 1 tablet (40 mg total) by mouth daily. 60 tablet 11  . glucose blood (ONETOUCH VERIO) test strip Use to test blood sugars daily. Dx: E11.9 100 each 12  . Lancets (ONETOUCH ULTRASOFT) lancets Use to test blood sugars daily. Dx: E11.9 100 each 12  . loratadine (CLARITIN) 10 MG tablet Take 10 mg by mouth  at bedtime.     . metFORMIN (GLUCOPHAGE) 500 MG tablet TAKE 1 TABLET WITH BREAKFAST AND 1/2 TO 1 TABLET WITH DINNER. 180 tablet 1  . metoCLOPramide (REGLAN) 10 MG tablet TAKE 1/2 TABLET BY MOUTH 4 TIMES A DAY 60 tablet 5  . metoprolol tartrate (LOPRESSOR) 25 MG tablet Take 0.5 tablets (12.5 mg total) by mouth 2 (two) times daily. 60 tablet 11  . Multiple Vitamin (MULTIVITAMIN WITH MINERALS) TABS tablet Take 1 tablet by mouth daily.    [name] omeprazole (PRILOSEC OTC) 20 MG tablet Take 40 mg by mouth daily with breakfast.    . potassium chloride SA (K-DUR,KLOR-CON) 20 MEQ tablet Take 1 tablet (20 mEq total) by mouth daily. 60 tablet 11  . Probiotic Product (ALIGN) 4 MG CAPS Take 4 mg by mouth daily.     . psyllium (METAMUCIL SMOOTH TEXTURE) 28 % packet  Take 1 packet by mouth daily before breakfast.     . venlafaxine XR (EFFEXOR-XR) 150 MG 24 hr capsule TAKE ONE CAPSULE BY MOUTH DAILY WITH BREAKFAST 90 capsule 1   No current facility-administered medications on file prior to visit.     ALLERGIES: Allergies  Allergen Reactions  . Augmentin [Amoxicillin-Pot Clavulanate] Rash    rash  . Erythromycin Nausea Only  . Penicillins Itching and Rash    Has patient had a PCN reaction causing immediate rash, facial/tongue/throat swelling, SOB or lightheadedness with hypotension: No Has patient had a PCN reaction causing severe rash involving mucus membranes or skin necrosis: No Has patient had a PCN reaction that required hospitalization: No Has patient had a PCN reaction occurring within the last 10 years: No  If all of the above answers are "NO", then may proceed with Cephalosporin use.     FAMILY HISTORY: Family History  Problem Relation Age of Onset  . COPD Mother   . Colon polyps Mother   . Irritable bowel syndrome Mother   . Heart disease Father   . Alcohol abuse Father   . Colon polyps Maternal Aunt     SOCIAL HISTORY: Social History   Socioeconomic History  . Marital status: Married      Spouse name: Not on file  . Number of children: 1  . Years of education: Not on file  . Highest education level: Not on file  Social Needs  . Financial resource strain: Not on file  . Food insecurity - worry: Not on file  . Food insecurity - inability: Not on file  . Transportation needs - medical: Not on file  . Transportation needs - non-medical: Not on file  Occupational History  . Occupation: Retired    Fish farm manager: RETIRED  Tobacco Use  . Smoking status: Never Smoker  . Smokeless tobacco: Never Used  Substance and Sexual Activity  . Alcohol use: No  . Drug use: No  . Sexual activity: Yes  Other Topics Concern  . Not on file  Social History Narrative   She lives with husband (1978) and two dogs. Step-son [name] (1971-psychiatrist in [place]). No grandkids. 2 dogs-sheltie/collie mix and border collie mix      Highest level of education:  Master in education   She is retired Animal nutritionist x 32 years.      Hobbies: time with dogs             REVIEW OF SYSTEMS: Constitutional: No fevers, chills, or sweats, no generalized fatigue, change in appetite Eyes: No visual changes, double vision, eye pain Ear, nose and throat: No hearing loss, ear pain, nasal congestion, sore throat Cardiovascular: No chest pain, palpitations Respiratory:  No shortness of breath at rest or with exertion, wheezes GastrointestinaI: No nausea, vomiting, diarrhea, abdominal pain, fecal incontinence Genitourinary:  No dysuria, urinary retention or frequency Musculoskeletal:  No neck pain, back pain Integumentary: No rash, pruritus, skin lesions Neurological: as above Psychiatric: No depression, insomnia, anxiety Endocrine: No palpitations, fatigue, diaphoresis, mood swings, change in appetite, change in weight, increased thirst Hematologic/Lymphatic:  No purpura, petechiae. Allergic/Immunologic: no itchy/runny eyes, nasal congestion, recent allergic reactions, rashes  PHYSICAL  EXAM: Vitals:   08/27/17 0848  BP: (!) 160/68  Pulse: (!) 56   General: No acute distress.  Patient appears well-groomed.  Head:  Normocephalic/atraumatic Eyes:  fundi examined but not visualized Neck: supple, no paraspinal tenderness, full range of motion Back: No paraspinal tenderness Heart: regular rate and rhythm Lungs:  Clear to auscultation bilaterally. Vascular: No carotid bruits. Neurological Exam: Mental status: alert and oriented to person, place, and time, recent and remote memory intact, fund of knowledge intact, attention and concentration intact, speech fluent and not dysarthric, language intact. Cranial nerves: CN I: not tested CN II: pupils equal, round and reactive to light, visual fields intact CN III, IV, VI:  full range of motion, no nystagmus, no ptosis CN V: facial sensation intact CN VII: upper and lower face symmetric CN VIII: hearing intact CN IX, X: gag intact, uvula midline CN XI: sternocleidomastoid and trapezius muscles intact CN XII: tongue midline Bulk & Tone: normal, no fasciculations. Motor:  5/5 throughout  Sensation: temperature and vibration sensation intact. Deep Tendon Reflexes:  2+ throughout, toes downgoing.  Finger to nose testing:  Without dysmetria.  Heel to shin:  Without dysmetria.  Gait:  Normal station and stride.  Able to turn and tandem walk. Romberg negative.  IMPRESSION: Migraine with persistent aura, not intractable Right occipital infarct on CT head, cryptogenic Hypertension Type 2 diabetes mellitus Hyperlipidemia  PLAN: 1.  To better visualize stroke, we will get MRI of brain without contrast 2.  For abortive migraine therapy, she may take ibuprofen or naproxen (although it increases risk of bleeding with Plavix, it should be okay if limited to no more than 2 days out of the week).  She already takes metoclopramide, so she may try taking that as abortive therapy as well. 3.  Continue Plavix 68m daily for secondary  stroke prevention 4.  Continue atorvastatin and glycemic control 5.  Advised to contact PCP's office regarding blood pressure recheck 6.  She should continue venlafaxine XR 1537m which is also a migraine preventative. 7.  Follow up in 3 months.  Thank you for allowing me to take part in the care of this patient.  AdMetta ClinesDO  CC:  StGarret ReddishMD

## 2017-08-27 NOTE — Patient Instructions (Signed)
1.  Continue the Plavix 75mg  daily and atorvastatin daily 2.  When you get a migraine, you may take naproxen (Aleve) or ibuprofen.  You may also take the metoclopramide to treat the migraine as well.  Limit to no more than 2 days out of the week.  Let me know if it is ineffective.   3.  We will get MRI of brain 4.  Contact Dr. Ansel Bong office to have blood pressure rechecked. 5.  Follow up in 3 months.

## 2017-08-28 ENCOUNTER — Encounter: Payer: Self-pay | Admitting: Family Medicine

## 2017-09-01 ENCOUNTER — Ambulatory Visit
Admission: RE | Admit: 2017-09-01 | Discharge: 2017-09-01 | Disposition: A | Discharge status: Home / Self Care | Payer: Medicare Other | Source: Ambulatory Visit | Attending: Neurology | Admitting: Neurology

## 2017-09-01 DIAGNOSIS — I639 Cerebral infarction, unspecified: Secondary | ICD-10-CM

## 2017-09-01 DIAGNOSIS — I10 Essential (primary) hypertension: Secondary | ICD-10-CM

## 2017-09-01 DIAGNOSIS — G43609 Persistent migraine aura with cerebral infarction, not intractable, without status migrainosus: Secondary | ICD-10-CM

## 2017-09-01 DIAGNOSIS — E119 Type 2 diabetes mellitus without complications: Secondary | ICD-10-CM

## 2017-09-01 DIAGNOSIS — E785 Hyperlipidemia, unspecified: Secondary | ICD-10-CM

## 2017-09-02 ENCOUNTER — Telehealth: Payer: Self-pay | Admitting: Neurology

## 2017-09-02 NOTE — Telephone Encounter (Signed)
Mychart message sent to patient.

## 2017-09-02 NOTE — Telephone Encounter (Signed)
-----   Message from Pieter Partridge, DO sent at 09/02/2017  1:43 PM EDT ----- MRI does confirm a small stroke correlating with the CT.  Continue current plan.

## 2017-09-03 ENCOUNTER — Encounter: Payer: Self-pay | Admitting: Neurology

## 2017-09-04 ENCOUNTER — Encounter: Payer: Self-pay | Admitting: Family Medicine

## 2017-09-04 ENCOUNTER — Other Ambulatory Visit: Payer: Self-pay

## 2017-09-04 MED ORDER — METOCLOPRAMIDE HCL 10 MG PO TABS: ORAL_TABLET | ORAL | 5 refills | Status: DC

## 2017-09-29 ENCOUNTER — Encounter: Payer: Self-pay | Admitting: Adult Health

## 2017-09-29 ENCOUNTER — Ambulatory Visit: Payer: Medicare Other | Admitting: Adult Health

## 2017-09-29 DIAGNOSIS — G4733 Obstructive sleep apnea (adult) (pediatric): Secondary | ICD-10-CM

## 2017-09-29 NOTE — Assessment & Plan Note (Signed)
Excellent control on CPAP  Plan  Patient Instructions  Continue on CPAP At bedtime  .  Keep up good work  Do not drive if sleepy .  Work on healthy weight  Follow up with Dr. Halford Chessman  Or Shruthi Northrup in 1 year and As needed

## 2017-09-29 NOTE — Assessment & Plan Note (Signed)
Wt loss  

## 2017-09-29 NOTE — Patient Instructions (Signed)
Continue on CPAP At bedtime.  Keep up good work.  Do not drive if sleepy .  Work on healthy weight  Follow up with Dr. Sood  Or Arth Nicastro in 1 year and As needed    

## 2017-09-29 NOTE — Progress Notes (Signed)
_0  ID: [name], female    DOB: 07-Apr-1951, 67 y.o.   MRN: [id number]  Chief Complaint  Patient presents with  . Follow-up    OSA     Referring provider: [name], MD  HPI: 67 year old female followed for obstructive sleep apnea  TEST  Home sleep study July 2017 AHI 19/hr   09/29/2017 Follow up ; OSA  Patient returns for a one year follow-up for sleep apnea.  She has moderate sleep apnea on CPAP at bedtime.  Patient says she feels that she benefits from CPAP with decreased daytime sleepiness.  Download shows excellent compliance with average usage at 9 hours.  Patient is on auto CPAP 5-10 cm H2O.  AHI is 3.7.  Minimum leaks.   Allergies  Allergen Reactions  . Augmentin [Amoxicillin-Pot Clavulanate] Rash    rash  . Erythromycin Nausea Only  . Penicillins Itching and Rash    Has patient had a PCN reaction causing immediate rash, facial/tongue/throat swelling, SOB or lightheadedness with hypotension: No Has patient had a PCN reaction causing severe rash involving mucus membranes or skin necrosis: No Has patient had a PCN reaction that required hospitalization: No Has patient had a PCN reaction occurring within the last 10 years: No  If all of the above answers are "NO", then may proceed with Cephalosporin use.     Immunization History  Administered Date(s) Administered  . Influenza Split 04/04/2011, 03/05/2012  . Influenza Whole 04/16/2007, 03/16/2009, 03/13/2010  . Influenza,inj,Quad PF,6+ Mos 03/30/2013, 03/10/2014, 03/23/2015, 02/27/2016  . Influenza-Unspecified 02/27/2017, 02/28/2017  . Pneumococcal Conjugate-13 05/21/2016  . Pneumococcal Polysaccharide-23 05/23/2017  . Td 06/24/1996, 08/06/2007  . Zoster 05/12/2014    Past Medical History:  Diagnosis Date  . Allergy   . Anxiety   . Arthritis    back & knee  . Asthma     mild per pt shows up with resp illness  . Chronic diastolic congestive heart failure ([place])   . Colon polyps   .  Diabetes mellitus without complication ([place])   . Gastric polyps   . Gastroparesis   . GERD (gastroesophageal reflux disease)   . Headache    sinus headaches and migraines at times  . Heart murmur   . History of migraine headaches   . Hyperlipidemia   . IBS (irritable bowel syndrome)   . Lumbar disc disease   . S/P aortic valve replacement with bioprosthetic valve 08/23/2016   25 mm Edwards Intuity rapid-deployment bovine pericardial tissue valve via partial upper mini sternotomy  . Sleep apnea     Tobacco History: Social History   Tobacco Use  Smoking Status Never Smoker  Smokeless Tobacco Never Used   Counseling given: Not Answered   Outpatient Encounter Medications as of 09/29/2017  Medication Sig  . atorvastatin (LIPITOR) 40 MG tablet Take 1 tablet (40 mg total) by mouth daily at 6 PM.  . Biotin 5000 MCG TABS Take 5,000 mcg by mouth daily.   . Blood Glucose Monitoring Suppl (ONETOUCH VERIO) W/DEVICE KIT 1 kit by Does not apply route once.  . calcium-vitamin D (OSCAL WITH D) 500-200 MG-UNIT per tablet Take 2 tablets by mouth daily.   . clindamycin (CLEOCIN) 300 MG capsule Take 2 capsules (600 mg total) by mouth 1 hour prior too dental cleaning.  . clopidogrel (PLAVIX) 75 MG tablet Take 1 tablet (75 mg total) by mouth daily.  . fluticasone (FLONASE) 50 MCG/ACT nasal spray Place 2 sprays into both nostrils daily.   [name]  furosemide (LASIX) 40 MG tablet Take 1 tablet (40 mg total) by mouth daily.  [name] glucose blood (ONETOUCH VERIO) test strip Use to test blood sugars daily. Dx: E11.9  . Lancets (ONETOUCH ULTRASOFT) lancets Use to test blood sugars daily. Dx: E11.9  . loratadine (CLARITIN) 10 MG tablet Take 10 mg by mouth at bedtime.   . metFORMIN (GLUCOPHAGE) 500 MG tablet TAKE 1 TABLET WITH BREAKFAST AND 1/2 TO 1 TABLET WITH DINNER.  [name] metoCLOPramide (REGLAN) 10 MG tablet TAKE 1/2 TABLET BY MOUTH 4 TIMES A DAY  . metoprolol tartrate (LOPRESSOR) 25 MG tablet Take 0.5 tablets (12.5 mg  total) by mouth 2 (two) times daily.  . Multiple Vitamin (MULTIVITAMIN WITH MINERALS) TABS tablet Take 1 tablet by mouth daily.  . naproxen sodium (ALEVE) 220 MG tablet Take 220 mg by mouth.  [name] omeprazole (PRILOSEC OTC) 20 MG tablet Take 40 mg by mouth daily with breakfast.  . potassium chloride SA (K-DUR,KLOR-CON) 20 MEQ tablet Take 1 tablet (20 mEq total) by mouth daily.  . Probiotic Product (ALIGN) 4 MG CAPS Take 4 mg by mouth daily.   . psyllium (METAMUCIL SMOOTH TEXTURE) 28 % packet Take 1 packet by mouth daily before breakfast.   . venlafaxine XR (EFFEXOR-XR) 150 MG 24 hr capsule TAKE ONE CAPSULE BY MOUTH DAILY WITH BREAKFAST   No facility-administered encounter medications on file as of 09/29/2017.      Review of Systems  Constitutional:   No  weight loss, night sweats,  Fevers, chills, fatigue, or  lassitude.  HEENT:   No headaches,  Difficulty swallowing,  Tooth/dental problems, or  Sore throat,                No sneezing, itching, ear ache, nasal congestion, post nasal drip,   CV:  No chest pain,  Orthopnea, PND, swelling in lower extremities, anasarca, dizziness, palpitations, syncope.   GI  No heartburn, indigestion, abdominal pain, nausea, vomiting, diarrhea, change in bowel habits, loss of appetite, bloody stools.   Resp: No shortness of breath with exertion or at rest.  No excess mucus, no productive cough,  No non-productive cough,  No coughing up of blood.  No change in color of mucus.  No wheezing.  No chest wall deformity  Skin: no rash or lesions.  GU: no dysuria, change in color of urine, no urgency or frequency.  No flank pain, no hematuria   MS:  No joint pain or swelling.  No decreased range of motion.  No back pain.    Physical Exam  BP 108/62 (BP Location: Left Arm, Cuff Size: Normal)   Pulse 69   Ht _0  (1.676 m)   Wt 233 lb (105.7 kg)   SpO2 97%   BMI 37.61 kg/m   GEN: A/Ox3; pleasant , NAD, obese    HEENT:  [place]/AT,  EACs-clear, TMs-wnl,  NOSE-clear, THROAT-clear, no lesions, no postnasal drip or exudate noted. Class 2-3 MP airway   NECK:  Supple w/ fair ROM; no JVD; normal carotid impulses w/o bruits; no thyromegaly or nodules palpated; no lymphadenopathy.    RESP  Clear  P & A; w/o, wheezes/ rales/ or rhonchi. no accessory muscle use, no dullness to percussion  CARD:  RRR, no m/r/g, no peripheral edema, pulses intact, no cyanosis or clubbing.  GI:   Soft & nt; nml bowel sounds; no organomegaly or masses detected.   Musco: Warm bil, no deformities or joint swelling noted.   Neuro: alert, no focal deficits noted.  Skin: Warm, no lesions or rashes    Lab Results:   BMET  BNP  Imaging:    Assessment & Plan:   OSA (obstructive sleep apnea) Excellent control on CPAP  Plan  Patient Instructions  Continue on CPAP At bedtime  .  Keep up good work  Do not drive if sleepy .  Work on healthy weight  Follow up with Dr. Halford Chessman  Or Breeona Waid in 1 year and As needed        OBESITY, MORBID Wt loss      Rexene Edison, NP 09/29/2017

## 2017-09-30 NOTE — Progress Notes (Signed)
Reviewed and agree with assessment/plan.   Merinda Victorino, MD [facility] 06/19/2016, 12:24 PM Pager:  [phone]  

## 2017-10-01 ENCOUNTER — Encounter: Payer: Self-pay | Admitting: Physician Assistant

## 2017-10-07 ENCOUNTER — Encounter: Payer: Self-pay | Admitting: Physician Assistant

## 2017-10-07 ENCOUNTER — Ambulatory Visit: Payer: Medicare Other | Admitting: Physician Assistant

## 2017-10-07 VITALS — BP 138/82 | HR 64 | Ht 66.0 in | Wt 232.0 lb

## 2017-10-07 DIAGNOSIS — Z953 Presence of xenogenic heart valve: Secondary | ICD-10-CM

## 2017-10-07 DIAGNOSIS — E782 Mixed hyperlipidemia: Secondary | ICD-10-CM | POA: Diagnosis not present

## 2017-10-07 DIAGNOSIS — I5032 Chronic diastolic (congestive) heart failure: Secondary | ICD-10-CM | POA: Diagnosis not present

## 2017-10-07 DIAGNOSIS — Z8673 Personal history of transient ischemic attack (TIA), and cerebral infarction without residual deficits: Secondary | ICD-10-CM

## 2017-10-07 NOTE — Progress Notes (Signed)
Cardiology Office Note    Date:  10/07/2017   ID:  [name], DOB 1951-04-16, MRN [id number]  PCP:  [name], MD  Cardiologist: Ena Dawley, MD  No chief complaint on file.   History of Present Illness:  [name] is a 67 y.o. female with a hx of Diabetes, hyperlipidemia, OSA on CPAP, and aortic valve replacement with bioprosthetic bovine pericardial tissue valve 08/23/16. Patient had only minor nonobstructive CAD on heart catheterization 08/02/16 done in preparation for AVR. Also right heart cath showed normal right heart hemodynamics. Echocardiogram showed normal EF of 60-65% and grade 1 diastolic dysfunction.   Last saw Dr. Meda Coffee 04/24/17 was doing well.  She had new diastolic murmur and echo was ordered to LVEF 60-65% and normal functioning bioprosthesis with normal gradients and only minimal leak.  Patient presented to her PCP Dr. Yong Channel 08/12/17 with prolonged migraine with prolonged perceived R.  Stat CT showed subacute stroke in the occipital lobe on the right.  2D echo did not show any clot.  She was started on Plavix 75 mg daily and aspirin was stopped. Carotid Dopplers did not show any significant ICA stenosis, MRA revealed flow-limiting stenosis versus artifact in the left M2 segment, MRI confirms subacute small right inferior occipital lobe infarct.  30-day monitor showed normal sinus rhythm the entire time without atrial fibrillation detected.  Patient comes in today for follow-up.  Premature all her symptoms have resolved from her stroke.  She denies chest pain, palpitations, dyspnea, dyspnea on exertion, dizziness or presyncope.  She was concerned about the valvular leak on her echo.   Past Medical History:  Diagnosis Date  . Allergy   . Anxiety   . Arthritis    back & knee  . Asthma     mild per pt shows up with resp illness  . Chronic diastolic congestive heart failure ([place])   . Colon polyps   . Diabetes mellitus without complication  ([place])   . Gastric polyps   . Gastroparesis   . GERD (gastroesophageal reflux disease)   . Headache    sinus headaches and migraines at times  . Heart murmur   . History of migraine headaches   . Hyperlipidemia   . IBS (irritable bowel syndrome)   . Lumbar disc disease   . S/P aortic valve replacement with bioprosthetic valve 08/23/2016   25 mm Edwards Intuity rapid-deployment bovine pericardial tissue valve via partial upper mini sternotomy  . Sleep apnea     Past Surgical History:  Procedure Laterality Date  . ABDOMINAL HYSTERECTOMY    . AORTIC VALVE REPLACEMENT N/A 08/23/2016   Procedure: AORTIC VALVE REPLACEMENT (AVR) - using partial Upper Sternotomy- 48mm Edwards Intuity Aortic Valve used;  Surgeon: Rexene Alberts, MD;  Location: [facility];  Service: Open Heart Surgery;  Laterality: N/A;  . BLADDER SUSPENSION  2007  . BREAST ENHANCEMENT SURGERY  1980  . CARPAL TUNNEL RELEASE Bilateral   . COLONOSCOPY    . DORSAL COMPARTMENT RELEASE Right 09/15/2014   Procedure: RIGHT WRIST DEQUERVAINS RELEASE ;  Surgeon: Kathryne Hitch, MD;  Location: [facility];  Service: Orthopedics;  Laterality: Right;  . ESOPHAGOGASTRODUODENOSCOPY    . KNEE ARTHROSCOPY  04/15/2012   Procedure: ARTHROSCOPY KNEE;  Surgeon: [name], MD;  Location: [facility];  Service: Orthopedics;  Laterality: Right;  . LAPAROSCOPIC CHOLECYSTECTOMY    . LASIK    . OOPHORECTOMY    . PALATE TO GINGIVA GRAFT  2017  .  RIGHT/LEFT HEART CATH AND CORONARY ANGIOGRAPHY N/A 08/02/2016   Procedure: Right/Left Heart Cath and Coronary Angiography;  Surgeon: Sherren Mocha, MD;  Location: [facility];  Service: Cardiovascular;  Laterality: N/A;  . ROTATOR CUFF REPAIR Left   . TEE WITHOUT CARDIOVERSION N/A 08/23/2016   Procedure: TRANSESOPHAGEAL ECHOCARDIOGRAM (TEE);  Surgeon: Rexene Alberts, MD;  Location: [facility];  Service: Open Heart Surgery;  Laterality: N/A;  . TOTAL KNEE ARTHROPLASTY  04/15/2012   Procedure: TOTAL KNEE  ARTHROPLASTY;  Surgeon: [name], MD;  Location: [facility];  Service: Orthopedics;  Laterality: Right;  . TRIGGER FINGER RELEASE Right 04/20/2015   Procedure: RIGHT TRIGGER FINGER RELEASE (TENDON SHEALTH INCISION) ;  Surgeon: [name], MD;  Location: [facility];  Service: Orthopedics;  Laterality: Right;    Current Medications: No outpatient medications have been marked as taking for the 10/07/17 encounter (Office Visit) with Imogene Burn, PA-C.     Allergies:   Augmentin [amoxicillin-pot clavulanate]; Erythromycin; and Penicillins   Social History   Socioeconomic History  . Marital status: Married    Spouse name: Not on file  . Number of children: 1  . Years of education: Not on file  . Highest education level: Not on file  Occupational History  . Occupation: Retired    Fish farm manager: RETIRED  Social Needs  . Financial resource strain: Not on file  . Food insecurity:    Worry: Not on file    Inability: Not on file  . Transportation needs:    Medical: Not on file    Non-medical: Not on file  Tobacco Use  . Smoking status: Never Smoker  . Smokeless tobacco: Never Used  Substance and Sexual Activity  . Alcohol use: No  . Drug use: No  . Sexual activity: Yes  Lifestyle  . Physical activity:    Days per week: Not on file    Minutes per session: Not on file  . Stress: Not on file  Relationships  . Social connections:    Talks on phone: Not on file    Gets together: Not on file    Attends religious service: Not on file    Active member of club or organization: Not on file    Attends meetings of clubs or organizations: Not on file    Relationship status: Not on file  Other Topics Concern  . Not on file  Social History Narrative   She lives with husband (1978) and two dogs. Step-son [name] (1971-psychiatrist in [place]). No grandkids. 2 dogs-sheltie/collie mix and border collie mix      Highest level of education:  Master in education   She  is retired Animal nutritionist x 32 years.      Hobbies: time with dogs              Family History:  The patient's family history includes Alcohol abuse in her father; COPD in her mother; Colon polyps in her maternal aunt and mother; Heart disease in her father; Irritable bowel syndrome in her mother.   ROS:   Please see the history of present illness.    Review of Systems  Constitution: Positive for weight gain.  HENT: Negative.   Eyes: Negative.   Cardiovascular: Negative.   Respiratory: Negative.   Hematologic/Lymphatic: Negative.   Musculoskeletal: Negative.  Negative for joint pain.  Gastrointestinal: Negative.   Genitourinary: Negative.   Neurological: Negative.    All other systems reviewed and are negative.  PHYSICAL EXAM:   VS:  BP 138/82   Pulse 64   Ht 5\' 6"  (1.676 m)   Wt 232 lb (105.2 kg)   SpO2 99%   BMI 37.45 kg/m   Physical Exam  GEN: Obese, in no acute distress  Neck: no JVD, carotid bruits, or masses Cardiac:RRR; 1/6 to 2/6 diastolic murmur at the left sternal border Respiratory:  clear to auscultation bilaterally, normal work of breathing GI: soft, nontender, nondistended, + BS Ext: without cyanosis, clubbing, or edema, Good distal pulses bilaterally Neuro:  Alert and Oriented x 3 Psych: euthymic mood, full affect  Wt Readings from Last 3 Encounters:  10/07/17 232 lb (105.2 kg)  09/29/17 233 lb (105.7 kg)  08/27/17 232 lb (105.2 kg)      Studies/Labs Reviewed:   EKG:  EKG is not ordered today.   Recent Labs: 04/24/2017: Hemoglobin 12.3; Platelets 358; TSH 3.390 05/23/2017: ALT 13; BUN 18; Creatinine, Ser 0.66; Potassium 4.6; Sodium 133   Lipid Panel    Component Value Date/Time   CHOL 167 05/23/2017 0840   TRIG 98.0 05/23/2017 0840   HDL 57.80 05/23/2017 0840   CHOLHDL 3 05/23/2017 0840   VLDL 19.6 05/23/2017 0840   LDLCALC 90 05/23/2017 0840   LDLDIRECT 84.0 11/09/2014 1117    Additional studies/ records that were reviewed today  include:  Right/Left Heart Cath and Coronary Angiography 08/02/16    1. Minor nonobstructive CAD 2. Normal right heart hemodynamics (including normal PCWP) 3. Known severe aortic stenosis     Intraoperative TEE 08/23/16  Left ventricle: Normal cavity size. Concentric hypertrophy of moderate severity. LV systolic function is normal with an EF of 60-65%. There are no obvious wall motion abnormalities.  Septum: No Patent Foramen Ovale present.  Left atrium: Patent foramen ovale not present.  Aortic valve: The valve is bicuspid. Severe valve thickening present. Severe valve calcification present. Moderately decreased leaflet separation. Severe stenosis. No regurgitation. No AV vegetation. No evidence of papillary fibroelastoma.  Aorta: The ascending aorta is mildly dilated.  Mitral valve: No leaflet thickening and calcification present. Mild mitral annular calcification.  Right ventricle: Normal cavity size, wall thickness and ejection fraction.  Tricuspid valve: Trace regurgitation. The tricuspid valve regurgitation jet is central.    Echo 04/2017 Study Conclusions   - Left ventricle: The cavity size was normal. Wall thickness was   normal. Systolic function was normal. The estimated ejection   fraction was in the range of 60% to 65%. Wall motion was normal;   there were no regional wall motion abnormalities. Features are   consistent with a pseudonormal left ventricular filling pattern,   with concomitant abnormal relaxation and increased filling   pressure (grade 2 diastolic dysfunction). - Aortic valve: Bioprosthetic aortic valve. No significant   stenosis. There was trivial peri-valvular regurgitation, in   subcostal view it could be mistaken for a small VSD. Mean   gradient (S): 6 mm Hg. Peak gradient (S): 12 mm Hg. - Mitral valve: There was trivial regurgitation. - Right ventricle: The cavity size was normal. Systolic function   was normal. - Pulmonary arteries: No complete  TR doppler jet so unable to   estimate PA systolic pressure. - Inferior vena cava: The vessel was normal in size. The   respirophasic diameter changes were in the normal range (= 50%),   consistent with normal central venous pressure.   Impressions:   - Normal LV size with EF 60-65%. Moderate diastolic dysfunction.   Normal RV size and  systolic function. S/p bioprosthetic aortic   valve replacement, no stenosis and trivial peri-valvular   regurgitation.   2D echo 3/5/19Study Conclusions   - Left ventricle: The cavity size was mildly dilated. Wall   thickness was increased in a pattern of mild LVH. Systolic   function was normal. The estimated ejection fraction was in the   range of 60% to 65%. Left ventricular diastolic function   parameters were normal. - Aortic valve: patient had a Edwards 25 mm Intuity Elite rapid   deployment bovine pericardial valve placed 09/12/16. 05/01/17 mean   gradient was 6 mmHg peak gradient 12 mmHg. Gradients have   increased now 17 mmHg and 34 mmHg peak. There is a mild leak seen   through the basal stented portion of the valve appears at around   10:00 on BSA views - Mitral valve: There was mild regurgitation. - Atrial septum: No defect or patent foramen ovale was identified.   30-day monitor 08/26/17  Study Highlights      Sinus rhythm the entire monitoring time.   Sinus rhythm the entire monitoring time. No arrhythmias detected, no atrial fibrillation.    ASSESSMENT:    1. S/P minimally invasive aortic valve replacement with bioprosthetic valve   2. History of CVA in adulthood   3. Mixed hyperlipidemia   4. Chronic diastolic congestive heart failure (HCC)      PLAN:  In order of problems listed above:  Status post minimally invasive AVR with both being tissue valve 08/2016.  Has had 2 subsequent echoes since then most recently because of a stroke.  No evidence of clot.  Normal LVEF 60-65% with minimally leaking prosthetic valve-but  progressed on echo 04/2017.  We will continue to monitor closely.  Follow-up with Dr. Meda Coffee in 6 months.  CVA 07/2017 now on Plavix followed by neurology.  2D echo without clot, 30-day monitor without A. Fib  Hyperlipidemia on Lipitor  Chronic diastolic CHF no evidence of CHF on exam.  Only take Lasix as needed.   Medication Adjustments/Labs and Tests Ordered: Current medicines are reviewed at length with the patient today.  Concerns regarding medicines are outlined above.  Medication changes, Labs and Tests ordered today are listed in the Patient Instructions below. Patient Instructions  Medication Instructions:  Your provider recommends that you continue on your current medications as directed. Please refer to the Current Medication list given to you today.    Labwork: None  Testing/Procedures: None  Follow-Up: Your provider wants you to follow-up in: 6 months with Dr. Meda Coffee. You will receive a reminder letter in the mail two months in advance. If you don't receive a letter, please call our office to schedule the follow-up appointment.    Any Other Special Instructions Will Be Listed Below (If Applicable).     If you need a refill on your cardiac medications before your next appointment, please call your pharmacy.      Signed, Ermalinda Barrios, PA-C  10/07/2017 1:23 PM    [facility] [place], [place], [place]  [address] Phone: [phone]; Fax: [phone]

## 2017-10-07 NOTE — Patient Instructions (Signed)
Medication Instructions:  Your provider recommends that you continue on your current medications as directed. Please refer to the Current Medication list given to you today.    Labwork: None  Testing/Procedures: None  Follow-Up: Your provider wants you to follow-up in: 6 months with Dr. Meda Coffee. You will receive a reminder letter in the mail two months in advance. If you don't receive a letter, please call our office to schedule the follow-up appointment.    Any Other Special Instructions Will Be Listed Below (If Applicable).     If you need a refill on your cardiac medications before your next appointment, please call your pharmacy.

## 2017-10-22 ENCOUNTER — Other Ambulatory Visit: Payer: Self-pay | Admitting: Cardiology

## 2017-10-23 NOTE — Telephone Encounter (Signed)
Yes please refill.  This is for SBE prophylaxis.  Thank you!

## 2017-10-23 NOTE — Telephone Encounter (Signed)
Pt's pharmacy is requesting a refill on clindamycin. Would Dr. Meda Coffee like to refill this medication? Please address

## 2017-10-26 ENCOUNTER — Encounter: Payer: Self-pay | Admitting: Family Medicine

## 2017-10-27 ENCOUNTER — Ambulatory Visit: Payer: Medicare Other | Admitting: Neurology

## 2017-10-31 ENCOUNTER — Encounter: Payer: Self-pay | Admitting: Family Medicine

## 2017-11-03 ENCOUNTER — Other Ambulatory Visit: Payer: Self-pay

## 2017-11-04 ENCOUNTER — Other Ambulatory Visit: Payer: Self-pay

## 2017-11-05 ENCOUNTER — Encounter: Payer: Self-pay | Admitting: Physical Therapy

## 2017-11-05 ENCOUNTER — Ambulatory Visit: Payer: Medicare Other | Attending: Orthopedic Surgery | Admitting: Physical Therapy

## 2017-11-05 DIAGNOSIS — R262 Difficulty in walking, not elsewhere classified: Secondary | ICD-10-CM | POA: Insufficient documentation

## 2017-11-05 DIAGNOSIS — M25562 Pain in left knee: Secondary | ICD-10-CM

## 2017-11-05 DIAGNOSIS — M25561 Pain in right knee: Secondary | ICD-10-CM | POA: Insufficient documentation

## 2017-11-05 NOTE — Therapy (Signed)
[facility] [place], [place], [address] Phone: [phone]   Fax:  [phone]  Physical Therapy Evaluation  Patient Details  Name: [name] MRN: [id number] Date of Birth: 04-Aug-1950 Referring Provider: T. Percell Miller   Encounter Date: 11/05/2017  PT End of Session - 11/05/17 0819    Visit Number  1    Date for PT Re-Evaluation  01/05/18    PT Start Time  0758    PT Stop Time  0850    PT Time Calculation (min)  52 min    Activity Tolerance  Patient tolerated treatment well    Behavior During Therapy  [facility] for tasks assessed/performed       Past Medical History:  Diagnosis Date  . Allergy   . Anxiety   . Arthritis    back & knee  . Asthma     mild per pt shows up with resp illness  . Chronic diastolic congestive heart failure ([place])   . Colon polyps   . Diabetes mellitus without complication ([place])   . Gastric polyps   . Gastroparesis   . GERD (gastroesophageal reflux disease)   . Headache    sinus headaches and migraines at times  . Heart murmur   . History of migraine headaches   . Hyperlipidemia   . IBS (irritable bowel syndrome)   . Lumbar disc disease   . S/P aortic valve replacement with bioprosthetic valve 08/23/2016   25 mm Edwards Intuity rapid-deployment bovine pericardial tissue valve via partial upper mini sternotomy  . Sleep apnea     Past Surgical History:  Procedure Laterality Date  . ABDOMINAL HYSTERECTOMY    . AORTIC VALVE REPLACEMENT N/A 08/23/2016   Procedure: AORTIC VALVE REPLACEMENT (AVR) - using partial Upper Sternotomy- 56mm Edwards Intuity Aortic Valve used;  Surgeon: Rexene Alberts, MD;  Location: [facility];  Service: Open Heart Surgery;  Laterality: N/A;  . BLADDER SUSPENSION  2007  . BREAST ENHANCEMENT SURGERY  1980  . CARPAL TUNNEL RELEASE Bilateral   . COLONOSCOPY    . DORSAL COMPARTMENT RELEASE Right 09/15/2014   Procedure: RIGHT WRIST DEQUERVAINS RELEASE ;   Surgeon: Kathryne Hitch, MD;  Location: [facility];  Service: Orthopedics;  Laterality: Right;  . ESOPHAGOGASTRODUODENOSCOPY    . KNEE ARTHROSCOPY  04/15/2012   Procedure: ARTHROSCOPY KNEE;  Surgeon: [name], MD;  Location: [facility];  Service: Orthopedics;  Laterality: Right;  . LAPAROSCOPIC CHOLECYSTECTOMY    . LASIK    . OOPHORECTOMY    . PALATE TO GINGIVA GRAFT  2017  . RIGHT/LEFT HEART CATH AND CORONARY ANGIOGRAPHY N/A 08/02/2016   Procedure: Right/Left Heart Cath and Coronary Angiography;  Surgeon: Sherren Mocha, MD;  Location: [facility];  Service: Cardiovascular;  Laterality: N/A;  . ROTATOR CUFF REPAIR Left   . TEE WITHOUT CARDIOVERSION N/A 08/23/2016   Procedure: TRANSESOPHAGEAL ECHOCARDIOGRAM (TEE);  Surgeon: Rexene Alberts, MD;  Location: [facility];  Service: Open Heart Surgery;  Laterality: N/A;  . TOTAL KNEE ARTHROPLASTY  04/15/2012   Procedure: TOTAL KNEE ARTHROPLASTY;  Surgeon: [name], MD;  Location: [facility];  Service: Orthopedics;  Laterality: Right;  . TRIGGER FINGER RELEASE Right 04/20/2015   Procedure: RIGHT TRIGGER FINGER RELEASE (TENDON SHEALTH INCISION) ;  Surgeon: [name], MD;  Location: [facility];  Service: Orthopedics;  Laterality: Right;    There were no vitals filed for this visit.  Subjective Assessment - 11/05/17 0800    Subjective  Patient reports that about a month ago she fell out of bed and landed on the knees.  X-rays were negative.  The MD felt like she had bruised her knees.  Reports that the pain just has not gone away and she is having some difficulty with ROM    Pertinent History  she does report a TIA in February, right TKR 2013    Limitations  Standing;Walking;House hold activities    Patient Stated Goals  have less pain    Currently in Pain?  Yes    Pain Score  1     Pain Location  Knee    Pain Orientation  Right;Left    Pain Descriptors / Indicators  Aching;Sore    Pain Type  Acute pain     Pain Onset  More than a month ago    Pain Frequency  Intermittent    Aggravating Factors   bending, standing, sitting, stairs pain up to 6/10    Pain Relieving Factors  rest, Aleve, pain can be 0/10    Effect of Pain on Daily Activities  reports difficulty with stairs         [facility] PT Assessment - 11/05/17 0001      Assessment   Medical Diagnosis  bilateral knee pain    Referring Provider  Alain Marion    Onset Date/Surgical Date  10/06/17    Prior Therapy  for back and TKR      Precautions   Precautions  None      Balance Screen   Has the patient fallen in the past 6 months  Yes    How many times?  1    Has the patient had a decrease in activity level because of a fear of falling?   No    Is the patient reluctant to leave their home because of a fear of falling?   No      Home Environment   Additional Comments  has some steps, does some housework      Prior Function   Level of Independence  Independent    Vocation  Retired    Leisure  was going to gym 2-3x/week but has not been back since having a TIA      ROM / Strength   AROM / PROM / Strength  AROM;PROM;Strength      AROM   AROM Assessment Site  Knee    Right/Left Knee  Right;Left    Right Knee Extension  0    Right Knee Flexion  110    Left Knee Extension  0    Left Knee Flexion  115      Strength   Overall Strength Comments  4/% for the knees      Flexibility   Soft Tissue Assessment /Muscle Length  yes    Hamstrings  tight    Quadriceps  very tight and sore    ITB  tight and sore      Palpation   Palpation comment  tender in the quads and the ITB, no crepitus      Ambulation/Gait   Gait Comments  toe out gait, mild antalgic on the right                Objective measurements completed on examination: See above findings.      [facility] Adult PT Treatment/Exercise - 11/05/17 0001      Exercises   Exercises  Knee/Hip  Knee/Hip Exercises: Aerobic   Nustep  Level 4 x 6 minutes       Knee/Hip Exercises: Machines for Strengthening   Cybex Knee Extension  5# 2x10    Cybex Knee Flexion  25# 2x10             PT Education - 11/05/17 0818    Education provided  Yes    Education Details  HS and ITB stretches    Person(s) Educated  Patient    Methods  Explanation;Demonstration    Comprehension  Verbalized understanding       PT Short Term Goals - 11/05/17 0826      PT SHORT TERM GOAL #1   Title  independent with intial HEP    Time  2    Period  Weeks    Status  New        PT Long Term Goals - 11/05/17 5784      PT LONG TERM GOAL #1   Title  decrease pain 50%    Time  8    Period  Weeks    Status  New      PT LONG TERM GOAL #2   Title  returns safely to the gym    Time  8    Period  Weeks    Status  New      PT LONG TERM GOAL #3   Title  go up and down steps without difficulty    Time  8    Period  Weeks    Status  New      PT LONG TERM GOAL #4   Title  report no difficukty getting up from sitting    Time  8    Period  Weeks    Status  New             Plan - 11/05/17 0820    Clinical Impression Statement  Patient reports a fall onto her knees out of bed about a month ago.  She reports that she had a TIA in February and stopped all exercises.  She would like to get back to the exercise, but now with the knee pain has hesitation.  The knee flexion is slightly limited, She reports pain when getting up from sitting and on stairs.  She is tight in the HS and the ITB    History and Personal Factors relevant to plan of care:  TIA, DM, right TKR    Clinical Presentation  Stable    Clinical Decision Making  Low    Rehab Potential  Good    PT Frequency  2x / week    PT Duration  8 weeks    PT Treatment/Interventions  ADLs/Self Care Home Management;Cryotherapy;Electrical Stimulation;Iontophoresis 4mg /ml Dexamethasone;Moist Heat;Gait training;Neuromuscular re-education;Balance training;Therapeutic exercise;Therapeutic activities;Functional  mobility training;Stair training;Patient/family education;Manual techniques    PT Next Visit Plan  Slowly add exercises and flexibility    Consulted and Agree with Plan of Care  Patient       Patient will benefit from skilled therapeutic intervention in order to improve the following deficits and impairments:  Abnormal gait, Decreased range of motion, Difficulty walking, Decreased endurance, Decreased activity tolerance, Pain, Impaired flexibility, Decreased strength, Decreased mobility  Visit Diagnosis: Acute pain of right knee - Plan: PT plan of care cert/re-cert  Acute pain of left knee - Plan: PT plan of care cert/re-cert  Difficulty in walking, not elsewhere classified - Plan: PT plan of care cert/re-cert     Problem  List Patient Active Problem List   Diagnosis Date Noted  . History of CVA in adulthood 08/13/2017  . Blurry vision 08/13/2017  . Hypertension 01/02/2017  . S/P minimally invasive aortic valve replacement with bioprosthetic valve 08/23/2016  . Chronic diastolic congestive heart failure ([place])   . Osteopenia 05/27/2016  . OSA (obstructive sleep apnea) 02/27/2016  . Bruxism 10/23/2015  . Elevated alkaline phosphatase level 05/16/2015  . Atypical chest pain 11/10/2014  . Acne 05/12/2014  . Diabetes mellitus type 2, controlled ([place]) 05/12/2014  . GERD (gastroesophageal reflux disease) 03/10/2014  . Generalized anxiety disorder 03/10/2014  . Migraines 03/10/2014  . EUSTACHIAN TUBE DYSFUNCTION, CHRONIC 06/14/2010  . Irritable bowel syndrome 12/15/2008  . OBESITY, MORBID 10/12/2008  . Low back pain 08/11/2008  . Hyperlipidemia 02/09/2008  . Allergic rhinitis 02/12/2007  . ASTHMA 02/12/2007    Sumner Boast., PT 11/05/2017, 8:39 AM  [facility] [address] [place], [place], [address] Phone: [phone]   Fax:  [phone]  Name: [name] MRN: [id number] Date of Birth:  05/09/1951

## 2017-11-07 ENCOUNTER — Encounter: Payer: Self-pay | Admitting: Family Medicine

## 2017-11-11 ENCOUNTER — Ambulatory Visit: Payer: Medicare Other | Admitting: Physical Therapy

## 2017-11-11 ENCOUNTER — Encounter: Payer: Self-pay | Admitting: Physical Therapy

## 2017-11-11 DIAGNOSIS — R262 Difficulty in walking, not elsewhere classified: Secondary | ICD-10-CM

## 2017-11-11 DIAGNOSIS — M25561 Pain in right knee: Secondary | ICD-10-CM | POA: Diagnosis not present

## 2017-11-11 DIAGNOSIS — M25562 Pain in left knee: Secondary | ICD-10-CM

## 2017-11-11 NOTE — Therapy (Signed)
[place], [place], [address] Phone: [phone]   Fax:  [phone]  Physical Therapy Treatment  Patient Details  Name: [name] MRN: [id number] Date of Birth: 1951-03-26 Referring Provider: T. Percell Miller   Encounter Date: 11/11/2017  PT End of Session - 11/11/17 1505    Visit Number  2    Date for PT Re-Evaluation  01/05/18    PT Start Time  1430    PT Stop Time  1519    PT Time Calculation (min)  49 min    Activity Tolerance  Patient tolerated treatment well    Behavior During Therapy  [facility] for tasks assessed/performed       Past Medical History:  Diagnosis Date  . Allergy   . Anxiety   . Arthritis    back & knee  . Asthma     mild per pt shows up with resp illness  . Chronic diastolic congestive heart failure ([place])   . Colon polyps   . Diabetes mellitus without complication ([place])   . Gastric polyps   . Gastroparesis   . GERD (gastroesophageal reflux disease)   . Headache    sinus headaches and migraines at times  . Heart murmur   . History of migraine headaches   . Hyperlipidemia   . IBS (irritable bowel syndrome)   . Lumbar disc disease   . S/P aortic valve replacement with bioprosthetic valve 08/23/2016   25 mm Edwards Intuity rapid-deployment bovine pericardial tissue valve via partial upper mini sternotomy  . Sleep apnea     Past Surgical History:  Procedure Laterality Date  . ABDOMINAL HYSTERECTOMY    . AORTIC VALVE REPLACEMENT N/A 08/23/2016   Procedure: AORTIC VALVE REPLACEMENT (AVR) - using partial Upper Sternotomy- 63mm Edwards Intuity Aortic Valve used;  Surgeon: Rexene Alberts, MD;  Location: [place];  Service: Open Heart Surgery;  Laterality: N/A;  . BLADDER SUSPENSION  2007  . BREAST ENHANCEMENT SURGERY  1980  . CARPAL TUNNEL RELEASE Bilateral   . COLONOSCOPY    . DORSAL COMPARTMENT RELEASE Right 09/15/2014   Procedure: RIGHT WRIST DEQUERVAINS RELEASE ;   Surgeon: Kathryne Hitch, MD;  Location: [place];  Service: Orthopedics;  Laterality: Right;  . ESOPHAGOGASTRODUODENOSCOPY    . KNEE ARTHROSCOPY  04/15/2012   Procedure: ARTHROSCOPY KNEE;  Surgeon: [name], MD;  Location: [place];  Service: Orthopedics;  Laterality: Right;  . LAPAROSCOPIC CHOLECYSTECTOMY    . LASIK    . OOPHORECTOMY    . PALATE TO GINGIVA GRAFT  2017  . RIGHT/LEFT HEART CATH AND CORONARY ANGIOGRAPHY N/A 08/02/2016   Procedure: Right/Left Heart Cath and Coronary Angiography;  Surgeon: Sherren Mocha, MD;  Location: [place] [facility];  Service: Cardiovascular;  Laterality: N/A;  . ROTATOR CUFF REPAIR Left   . TEE WITHOUT CARDIOVERSION N/A 08/23/2016   Procedure: TRANSESOPHAGEAL ECHOCARDIOGRAM (TEE);  Surgeon: Rexene Alberts, MD;  Location: [place];  Service: Open Heart Surgery;  Laterality: N/A;  . TOTAL KNEE ARTHROPLASTY  04/15/2012   Procedure: TOTAL KNEE ARTHROPLASTY;  Surgeon: [name], MD;  Location: [place];  Service: Orthopedics;  Laterality: Right;  . TRIGGER FINGER RELEASE Right 04/20/2015   Procedure: RIGHT TRIGGER FINGER RELEASE (TENDON SHEALTH INCISION) ;  Surgeon: [name], MD;  Location: [place];  Service: Orthopedics;  Laterality: Right;    There were no vitals filed for this visit.  Subjective Assessment - 11/11/17 1431    Subjective  Patient reports that she is doing well overall, only have pain with stairs     Currently in Pain?  No/denies    Pain Score  0-No pain                       [facility] Adult PT Treatment/Exercise - 11/11/17 0001      Knee/Hip Exercises: Aerobic   Nustep  Level 4 x 6 minutes      Knee/Hip Exercises: Machines for Strengthening   Cybex Knee Extension  5# 2x10    Cybex Knee Flexion  25# 2x10    Cybex Leg Press  20lb 2x10      Knee/Hip Exercises: Standing   Forward Step Up  Both;1 set;10 reps;Hand Hold: 0;Step Height: 6"      Knee/Hip Exercises: Seated   Sit to  Sand  1 set;10 reps;without UE support      Modalities   Modalities  Cryotherapy      Cryotherapy   Number Minutes Cryotherapy  10 Minutes    Cryotherapy Location  Knee    Type of Cryotherapy  Ice pack               PT Short Term Goals - 11/05/17 8850      PT SHORT TERM GOAL #1   Title  independent with intial HEP    Time  2    Period  Weeks    Status  New        PT Long Term Goals - 11/05/17 2774      PT LONG TERM GOAL #1   Title  decrease pain 50%    Time  8    Period  Weeks    Status  New      PT LONG TERM GOAL #2   Title  returns safely to the gym    Time  8    Period  Weeks    Status  New      PT LONG TERM GOAL #3   Title  go up and down steps without difficulty    Time  8    Period  Weeks    Status  New      PT LONG TERM GOAL #4   Title  report no difficukty getting up from sitting    Time  8    Period  Weeks    Status  New            Plan - 11/11/17 1507    Clinical Impression Statement  Pt tolerated and initial progression to therapeutic exercise well. She does report some increase R  knee pain with sit to stand. No issues with the 4 inch step. Good ROM on machine exercises.      Rehab Potential  Good    PT Frequency  2x / week    PT Duration  8 weeks    PT Treatment/Interventions  ADLs/Self Care Home Management;Cryotherapy;Electrical Stimulation;Iontophoresis 4mg /ml Dexamethasone;Moist Heat;Gait training;Neuromuscular re-education;Balance training;Therapeutic exercise;Therapeutic activities;Functional mobility training;Stair training;Patient/family education;Manual techniques    PT Next Visit Plan  Slowly add exercises and flexibility       Patient will benefit from skilled therapeutic intervention in order to improve the following deficits and impairments:  Abnormal gait, Decreased range of motion, Difficulty walking, Decreased endurance, Decreased activity tolerance, Pain, Impaired flexibility, Decreased strength, Decreased  mobility  Visit Diagnosis: Acute pain of right knee  Difficulty in walking,  not elsewhere classified  Acute pain of left knee     Problem List Patient Active Problem List   Diagnosis Date Noted  . History of CVA in adulthood 08/13/2017  . Blurry vision 08/13/2017  . Hypertension 01/02/2017  . S/P minimally invasive aortic valve replacement with bioprosthetic valve 08/23/2016  . Chronic diastolic congestive heart failure ([place])   . Osteopenia 05/27/2016  . OSA (obstructive sleep apnea) 02/27/2016  . Bruxism 10/23/2015  . Elevated alkaline phosphatase level 05/16/2015  . Atypical chest pain 11/10/2014  . Acne 05/12/2014  . Diabetes mellitus type 2, controlled ([place]) 05/12/2014  . GERD (gastroesophageal reflux disease) 03/10/2014  . Generalized anxiety disorder 03/10/2014  . Migraines 03/10/2014  . EUSTACHIAN TUBE DYSFUNCTION, CHRONIC 06/14/2010  . Irritable bowel syndrome 12/15/2008  . OBESITY, MORBID 10/12/2008  . Low back pain 08/11/2008  . Hyperlipidemia 02/09/2008  . Allergic rhinitis 02/12/2007  . ASTHMA 02/12/2007    Scot Jun, PTA 11/11/2017, 3:10 PM  [place], [place], [address] Phone: [phone]   Fax:  [phone]  Name: DELLA SCRIVENER MRN: [id number] Date of Birth: 12/14/1950

## 2017-11-13 ENCOUNTER — Ambulatory Visit: Payer: Medicare Other | Admitting: Physical Therapy

## 2017-11-13 ENCOUNTER — Other Ambulatory Visit: Payer: Self-pay

## 2017-11-13 ENCOUNTER — Encounter: Payer: Self-pay | Admitting: Physical Therapy

## 2017-11-13 DIAGNOSIS — M25561 Pain in right knee: Secondary | ICD-10-CM

## 2017-11-13 DIAGNOSIS — M25562 Pain in left knee: Secondary | ICD-10-CM

## 2017-11-13 DIAGNOSIS — R262 Difficulty in walking, not elsewhere classified: Secondary | ICD-10-CM

## 2017-11-13 MED ORDER — ONETOUCH VERIO W/DEVICE KIT: 1.0000 | PACK | Freq: Once | 0 refills | Status: AC

## 2017-11-13 NOTE — Therapy (Signed)
[place], [place], [address] Phone: [phone]   Fax:  [phone]  Physical Therapy Treatment  Patient Details  Name: [name] MRN: [id number] Date of Birth: 24-May-1951 Referring Provider: T. Percell Miller   Encounter Date: 11/13/2017  PT End of Session - 11/13/17 1602    Visit Number  3    Date for PT Re-Evaluation  01/05/18    PT Start Time  4888    PT Stop Time  1615    PT Time Calculation (min)  45 min    Activity Tolerance  Patient tolerated treatment well    Behavior During Therapy  [facility] for tasks assessed/performed       Past Medical History:  Diagnosis Date  . Allergy   . Anxiety   . Arthritis    back & knee  . Asthma     mild per pt shows up with resp illness  . Chronic diastolic congestive heart failure ([place])   . Colon polyps   . Diabetes mellitus without complication ([place])   . Gastric polyps   . Gastroparesis   . GERD (gastroesophageal reflux disease)   . Headache    sinus headaches and migraines at times  . Heart murmur   . History of migraine headaches   . Hyperlipidemia   . IBS (irritable bowel syndrome)   . Lumbar disc disease   . S/P aortic valve replacement with bioprosthetic valve 08/23/2016   25 mm Edwards Intuity rapid-deployment bovine pericardial tissue valve via partial upper mini sternotomy  . Sleep apnea     Past Surgical History:  Procedure Laterality Date  . ABDOMINAL HYSTERECTOMY    . AORTIC VALVE REPLACEMENT N/A 08/23/2016   Procedure: AORTIC VALVE REPLACEMENT (AVR) - using partial Upper Sternotomy- 48m Edwards Intuity Aortic Valve used;  Surgeon: CRexene Alberts MD;  [place]  Service: Open Heart Surgery;  Laterality: N/A;  . BLADDER SUSPENSION  2007  . BREAST ENHANCEMENT SURGERY  1980  . CARPAL TUNNEL RELEASE Bilateral   . COLONOSCOPY    . DORSAL COMPARTMENT RELEASE Right 09/15/2014   Procedure: RIGHT WRIST DEQUERVAINS RELEASE ;   Surgeon: DKathryne Hitch MD;  [place]  Service: Orthopedics;  Laterality: Right;  . ESOPHAGOGASTRODUODENOSCOPY    . KNEE ARTHROSCOPY  04/15/2012   Procedure: ARTHROSCOPY KNEE;  Surgeon: DNinetta Lights MD;  [place]  Service: Orthopedics;  Laterality: Right;  . LAPAROSCOPIC CHOLECYSTECTOMY    . LASIK    . OOPHORECTOMY    . PALATE TO GINGIVA GRAFT  2017  . RIGHT/LEFT HEART CATH AND CORONARY ANGIOGRAPHY N/A 08/02/2016   Procedure: Right/Left Heart Cath and Coronary Angiography;  Surgeon: MSherren Mocha MD;  [facility];  Service: Cardiovascular;  Laterality: N/A;  . ROTATOR CUFF REPAIR Left   . TEE WITHOUT CARDIOVERSION N/A 08/23/2016   Procedure: TRANSESOPHAGEAL ECHOCARDIOGRAM (TEE);  Surgeon: CRexene Alberts MD;  [place]  Service: Open Heart Surgery;  Laterality: N/A;  . TOTAL KNEE ARTHROPLASTY  04/15/2012   Procedure: TOTAL KNEE ARTHROPLASTY;  Surgeon: DNinetta Lights MD;  [place]  Service: Orthopedics;  Laterality: Right;  . TRIGGER FINGER RELEASE Right 04/20/2015   Procedure: RIGHT TRIGGER FINGER RELEASE (TENDON SHEALTH INCISION) ;  Surgeon: DNinetta Lights MD;  [place]  Service: Orthopedics;  Laterality: Right;    There were no vitals filed for this visit.  Subjective Assessment - 11/13/17 1530    Subjective  Patient reports that she was very tired and a little sore    Currently in Pain?  Yes    Pain Location  Knee    Pain Orientation  Right;Left                       [facility] Adult PT Treatment/Exercise - 11/13/17 0001      Knee/Hip Exercises: Aerobic   Recumbent Bike  Level 0 x 5 minutes    Nustep  Level 4 x 6 minutes      Knee/Hip Exercises: Machines for Strengthening   Cybex Knee Extension  5# 2x10    Cybex Knee Flexion  25# 2x10    Cybex Leg Press  20lb 2x10      Knee/Hip Exercises: Standing   Walking with Sports Cord  resisted gait all directions       Knee/Hip Exercises: Seated   Ball Squeeze  25    Abduction/Adduction   2 sets;10 reps    Abd/Adduction Limitations  red tband      Knee/Hip Exercises: Supine   Bridges with Ball Squeeze  2 sets;10 reps               PT Short Term Goals - 11/13/17 1603      PT SHORT TERM GOAL #1   Title  independent with intial HEP    Status  Partially Met        PT Long Term Goals - 11/05/17 0826      PT LONG TERM GOAL #1   Title  decrease pain 50%    Time  8    Period  Weeks    Status  New      PT LONG TERM GOAL #2   Title  returns safely to the gym    Time  8    Period  Weeks    Status  New      PT LONG TERM GOAL #3   Title  go up and down steps without difficulty    Time  8    Period  Weeks    Status  New      PT LONG TERM GOAL #4   Title  report no difficukty getting up from sitting    Time  8    Period  Weeks    Status  New            Plan - 11/13/17 1602    Clinical Impression Statement  Patient with weakness and fatigue with activity, she does very well wihtout much increase of pain.  Will need to slowly progress activity    PT Next Visit Plan  Slowly add exercises and flexibility    Consulted and Agree with Plan of Care  Patient       Patient will benefit from skilled therapeutic intervention in order to improve the following deficits and impairments:  Abnormal gait, Decreased range of motion, Difficulty walking, Decreased endurance, Decreased activity tolerance, Pain, Impaired flexibility, Decreased strength, Decreased mobility  Visit Diagnosis: Acute pain of right knee  Difficulty in walking, not elsewhere classified  Acute pain of left knee     Problem List Patient Active Problem List   Diagnosis Date Noted  . History of CVA in adulthood 08/13/2017  . Blurry vision 08/13/2017  . Hypertension 01/02/2017  . S/P minimally invasive aortic valve replacement with bioprosthetic valve 08/23/2016  . Chronic diastolic congestive heart failure  (  [place])   . Osteopenia 05/27/2016  . OSA (obstructive sleep apnea) 02/27/2016  . Bruxism 10/23/2015  . Elevated alkaline phosphatase level 05/16/2015  . Atypical chest pain 11/10/2014  . Acne 05/12/2014  . Diabetes mellitus type 2, controlled ([place]) 05/12/2014  . GERD (gastroesophageal reflux disease) 03/10/2014  . Generalized anxiety disorder 03/10/2014  . Migraines 03/10/2014  . EUSTACHIAN TUBE DYSFUNCTION, CHRONIC 06/14/2010  . Irritable bowel syndrome 12/15/2008  . OBESITY, MORBID 10/12/2008  . Low back pain 08/11/2008  . Hyperlipidemia 02/09/2008  . Allergic rhinitis 02/12/2007  . ASTHMA 02/12/2007    Sumner Boast., PT 11/13/2017, 4:03 PM  [place], [place], [address] Phone: [phone]   Fax:  [phone]  Name: [name] MRN: [id number] Date of Birth: 1951/01/14

## 2017-11-18 ENCOUNTER — Ambulatory Visit: Payer: Medicare Other | Admitting: Physical Therapy

## 2017-11-18 DIAGNOSIS — R262 Difficulty in walking, not elsewhere classified: Secondary | ICD-10-CM

## 2017-11-18 DIAGNOSIS — M25561 Pain in right knee: Secondary | ICD-10-CM | POA: Diagnosis not present

## 2017-11-18 DIAGNOSIS — M25562 Pain in left knee: Secondary | ICD-10-CM

## 2017-11-18 NOTE — Therapy (Signed)
[place], [place], [address] Phone: [phone]   Fax:  [phone]  Physical Therapy Treatment  Patient Details  Name: [name] MRN: [id number] Date of Birth: 13-Oct-1950 Referring Provider: T. Percell Miller   Encounter Date: 11/18/2017  PT End of Session - 11/18/17 1436    Visit Number  4    Date for PT Re-Evaluation  01/05/18    PT Start Time  3825    PT Stop Time  1440    PT Time Calculation (min)  43 min       Past Medical History:  Diagnosis Date  . Allergy   . Anxiety   . Arthritis    back & knee  . Asthma     mild per pt shows up with resp illness  . Chronic diastolic congestive heart failure ([place])   . Colon polyps   . Diabetes mellitus without complication ([place])   . Gastric polyps   . Gastroparesis   . GERD (gastroesophageal reflux disease)   . Headache    sinus headaches and migraines at times  . Heart murmur   . History of migraine headaches   . Hyperlipidemia   . IBS (irritable bowel syndrome)   . Lumbar disc disease   . S/P aortic valve replacement with bioprosthetic valve 08/23/2016   25 mm Edwards Intuity rapid-deployment bovine pericardial tissue valve via partial upper mini sternotomy  . Sleep apnea     Past Surgical History:  Procedure Laterality Date  . ABDOMINAL HYSTERECTOMY    . AORTIC VALVE REPLACEMENT N/A 08/23/2016   Procedure: AORTIC VALVE REPLACEMENT (AVR) - using partial Upper Sternotomy- 55m Edwards Intuity Aortic Valve used;  Surgeon: CRexene Alberts MD;  [place]  Service: Open Heart Surgery;  Laterality: N/A;  . BLADDER SUSPENSION  2007  . BREAST ENHANCEMENT SURGERY  1980  . CARPAL TUNNEL RELEASE Bilateral   . COLONOSCOPY    . DORSAL COMPARTMENT RELEASE Right 09/15/2014   Procedure: RIGHT WRIST DEQUERVAINS RELEASE ;  Surgeon: DKathryne Hitch MD;  [place]  Service: Orthopedics;  Laterality: Right;  .  ESOPHAGOGASTRODUODENOSCOPY    . KNEE ARTHROSCOPY  04/15/2012   Procedure: ARTHROSCOPY KNEE;  Surgeon: DNinetta Lights MD;  [place]  Service: Orthopedics;  Laterality: Right;  . LAPAROSCOPIC CHOLECYSTECTOMY    . LASIK    . OOPHORECTOMY    . PALATE TO GINGIVA GRAFT  2017  . RIGHT/LEFT HEART CATH AND CORONARY ANGIOGRAPHY N/A 08/02/2016   Procedure: Right/Left Heart Cath and Coronary Angiography;  Surgeon: MSherren Mocha MD;  [facility];  Service: Cardiovascular;  Laterality: N/A;  . ROTATOR CUFF REPAIR Left   . TEE WITHOUT CARDIOVERSION N/A 08/23/2016   Procedure: TRANSESOPHAGEAL ECHOCARDIOGRAM (TEE);  Surgeon: CRexene Alberts MD;  [place]  Service: Open Heart Surgery;  Laterality: N/A;  . TOTAL KNEE ARTHROPLASTY  04/15/2012   Procedure: TOTAL KNEE ARTHROPLASTY;  Surgeon: DNinetta Lights MD;  [place] [place]  Service: Orthopedics;  Laterality: Right;  . TRIGGER FINGER RELEASE Right 04/20/2015   Procedure: RIGHT TRIGGER FINGER RELEASE (TENDON SHEALTH INCISION) ;  Surgeon: DNinetta Lights MD;  [place]  Service: Orthopedics;  Laterality: Right;    There were no vitals filed for this visit.  Subjective Assessment - 11/18/17 1400    Subjective  knees are geting better, left hip bursitis is acting up  Currently in Pain?  Yes    Pain Score  4     Pain Location  Hip    Pain Orientation  Left                       [facility] Adult PT Treatment/Exercise - 11/18/17 0001      Knee/Hip Exercises: Aerobic   Recumbent Bike  L 1 6 min    Nustep  Level 5 x 6 minutes      Knee/Hip Exercises: Machines for Strengthening   Cybex Knee Extension  5# 2x10    Cybex Knee Flexion  25# 2x10    Cybex Leg Press  30# 2x10      Knee/Hip Exercises: Standing   Lateral Step Up  Both;1 set;10 reps;Hand Hold: 2;Step Height: 6" opp leg abd    Forward Step Up  Both;1 set;10 reps;Hand Hold: 2;Step Height: 6" opp leg ext    Other Standing  Knee Exercises  on airex hip 3 way red tband 15 reps each      Knee/Hip Exercises: Seated   Ball Squeeze  25    Abduction/Adduction   2 sets;10 reps    Abd/Adduction Limitations  red tband               PT Short Term Goals - 11/13/17 1603      PT SHORT TERM GOAL #1   Title  independent with intial HEP    Status  Partially Met        PT Long Term Goals - 11/05/17 0826      PT LONG TERM GOAL #1   Title  decrease pain 50%    Time  8    Period  Weeks    Status  New      PT LONG TERM GOAL #2   Title  returns safely to the gym    Time  8    Period  Weeks    Status  New      PT LONG TERM GOAL #3   Title  go up and down steps without difficulty    Time  8    Period  Weeks    Status  New      PT LONG TERM GOAL #4   Title  report no difficukty getting up from sitting    Time  8    Period  Weeks    Status  New            Plan - 11/18/17 1437    Clinical Impression Statement  added in hip strengthening ex today d/t hip bursitis pain probabaly d/t weakness and gait compensation , she verb pain but tolerable.     PT Treatment/Interventions  ADLs/Self Care Home Management;Cryotherapy;Electrical Stimulation;Iontophoresis 41m/ml Dexamethasone;Moist Heat;Gait training;Neuromuscular re-education;Balance training;Therapeutic exercise;Therapeutic activities;Functional mobility training;Stair training;Patient/family education;Manual techniques    PT Next Visit Plan  assess and progress       Patient will benefit from skilled therapeutic intervention in order to improve the following deficits and impairments:  Abnormal gait, Decreased range of motion, Difficulty walking, Decreased endurance, Decreased activity tolerance, Pain, Impaired flexibility, Decreased strength, Decreased mobility  Visit Diagnosis: Acute pain of right knee  Difficulty in walking, not elsewhere classified  Acute pain of left knee     Problem List Patient Active Problem List   Diagnosis  Date Noted  . History of CVA in adulthood 08/13/2017  . Blurry vision 08/13/2017  . Hypertension 01/02/2017  . S/P  minimally invasive aortic valve replacement with bioprosthetic valve 08/23/2016  . Chronic diastolic congestive heart failure ([place])   . Osteopenia 05/27/2016  . OSA (obstructive sleep apnea) 02/27/2016  . Bruxism 10/23/2015  . Elevated alkaline phosphatase level 05/16/2015  . Atypical chest pain 11/10/2014  . Acne 05/12/2014  . Diabetes mellitus type 2, controlled ([place]) 05/12/2014  . GERD (gastroesophageal reflux disease) 03/10/2014  . Generalized anxiety disorder 03/10/2014  . Migraines 03/10/2014  . EUSTACHIAN TUBE DYSFUNCTION, CHRONIC 06/14/2010  . Irritable bowel syndrome 12/15/2008  . OBESITY, MORBID 10/12/2008  . Low back pain 08/11/2008  . Hyperlipidemia 02/09/2008  . Allergic rhinitis 02/12/2007  . ASTHMA 02/12/2007    Manveer Gomes,ANGIE PTA 11/18/2017, 2:39 PM  [place], [place], [address] Phone: [phone]   Fax:  [phone]  Name: [name] MRN: [id number] Date of Birth: [birth year]

## 2017-11-20 ENCOUNTER — Encounter: Payer: Self-pay | Admitting: Physical Therapy

## 2017-11-20 ENCOUNTER — Ambulatory Visit: Payer: Medicare Other | Admitting: Physical Therapy

## 2017-11-20 DIAGNOSIS — M25562 Pain in left knee: Secondary | ICD-10-CM

## 2017-11-20 DIAGNOSIS — M25561 Pain in right knee: Secondary | ICD-10-CM

## 2017-11-20 DIAGNOSIS — R262 Difficulty in walking, not elsewhere classified: Secondary | ICD-10-CM

## 2017-11-20 NOTE — Therapy (Signed)
[place], [place], [address] Phone: [phone]   Fax:  [phone]  Physical Therapy Treatment  Patient Details  Name: [name] MRN: [id number] Date of Birth: 1951-04-07 Referring Provider: T. Percell Miller   Encounter Date: 11/20/2017  PT End of Session - 11/20/17 1528    Visit Number  5    Date for PT Re-Evaluation  01/05/18    PT Start Time  1440    PT Stop Time  1540    PT Time Calculation (min)  60 min       Past Medical History:  Diagnosis Date  . Allergy   . Anxiety   . Arthritis    back & knee  . Asthma     mild per pt shows up with resp illness  . Chronic diastolic congestive heart failure ([place])   . Colon polyps   . Diabetes mellitus without complication ([place])   . Gastric polyps   . Gastroparesis   . GERD (gastroesophageal reflux disease)   . Headache    sinus headaches and migraines at times  . Heart murmur   . History of migraine headaches   . Hyperlipidemia   . IBS (irritable bowel syndrome)   . Lumbar disc disease   . S/P aortic valve replacement with bioprosthetic valve 08/23/2016   25 mm Edwards Intuity rapid-deployment bovine pericardial tissue valve via partial upper mini sternotomy  . Sleep apnea     Past Surgical History:  Procedure Laterality Date  . ABDOMINAL HYSTERECTOMY    . AORTIC VALVE REPLACEMENT N/A 08/23/2016   Procedure: AORTIC VALVE REPLACEMENT (AVR) - using partial Upper Sternotomy- 3m Edwards Intuity Aortic Valve used;  Surgeon: CRexene Alberts MD;  [facility]  Service: Open Heart Surgery;  Laterality: N/A;  . BLADDER SUSPENSION  2007  . BREAST ENHANCEMENT SURGERY  1980  . CARPAL TUNNEL RELEASE Bilateral   . COLONOSCOPY    . DORSAL COMPARTMENT RELEASE Right 09/15/2014   Procedure: RIGHT WRIST DEQUERVAINS RELEASE ;  Surgeon: DKathryne Hitch MD;  [facility]  Service: Orthopedics;  Laterality: Right;  .  ESOPHAGOGASTRODUODENOSCOPY    . KNEE ARTHROSCOPY  04/15/2012   Procedure: ARTHROSCOPY KNEE;  Surgeon: DNinetta Lights MD;  [facility]  Service: Orthopedics;  Laterality: Right;  . LAPAROSCOPIC CHOLECYSTECTOMY    . LASIK    . OOPHORECTOMY    . PALATE TO GINGIVA GRAFT  2017  . RIGHT/LEFT HEART CATH AND CORONARY ANGIOGRAPHY N/A 08/02/2016   Procedure: Right/Left Heart Cath and Coronary Angiography;  Surgeon: MSherren Mocha MD;  [facility];  Service: Cardiovascular;  Laterality: N/A;  . ROTATOR CUFF REPAIR Left   . TEE WITHOUT CARDIOVERSION N/A 08/23/2016   Procedure: TRANSESOPHAGEAL ECHOCARDIOGRAM (TEE);  Surgeon: CRexene Alberts MD;  [facility]  Service: Open Heart Surgery;  Laterality: N/A;  . TOTAL KNEE ARTHROPLASTY  04/15/2012   Procedure: TOTAL KNEE ARTHROPLASTY;  Surgeon: DNinetta Lights MD;  Location: [facility]  Service: Orthopedics;  Laterality: Right;  . TRIGGER FINGER RELEASE Right 04/20/2015   Procedure: RIGHT TRIGGER FINGER RELEASE (TENDON SHEALTH INCISION) ;  Surgeon: DNinetta Lights MD;  [facility]  Service: Orthopedics;  Laterality: Right;    There were no vitals filed for this visit.  Subjective Assessment - 11/20/17 1441    Subjective  knees are just a bit sore. seeing PCP next week and  going to ask about hip injection ( hip is more bothersome than knees now)    Currently in Pain?  Yes    Pain Score  2     Pain Location  Knee    Pain Descriptors / Indicators  Sore                       [facility] Adult PT Treatment/Exercise - 11/20/17 0001      Knee/Hip Exercises: Aerobic   Recumbent Bike  L 1 6 min    Nustep  Level 5 x 51mnutes      Knee/Hip Exercises: Machines for Strengthening   Cybex Knee Extension  5# 3x10    Cybex Knee Flexion  25# 3x10    Cybex Leg Press  30# 3x10      Knee/Hip Exercises: Standing   Walking with Sports Cord  resisted gait all directions      Knee/Hip Exercises: Seated    Ball Squeeze  25    Clamshell with TheraBand  Green 20    Marching  Strengthening;Both;15 reps green tband      Modalities   Modalities  Electrical Stimulation;Cryotherapy      Cryotherapy   Number Minutes Cryotherapy  15 Minutes    Cryotherapy Location  Knee    Type of Cryotherapy  Ice pack      Electrical Stimulation   Electrical Stimulation Location  left knee    Electrical Stimulation Action  premod    Electrical Stimulation Goals  Pain               PT Short Term Goals - 11/20/17 1530      PT SHORT TERM GOAL #1   Title  independent with intial HEP    Status  Achieved        PT Long Term Goals - 11/20/17 1530      PT LONG TERM GOAL #1   Title  decrease pain 50%    Status  Partially Met      PT LONG TERM GOAL #2   Title  returns safely to the gym    Baseline  discussed safe gym return and ex plus addition of abd/add machine    Status  Partially Met      PT LONG TERM GOAL #3   Title  go up and down steps without difficulty    Status  On-going      PT LONG TERM GOAL #4   Title  report no difficukty getting up from sitting    Status  On-going            Plan - 11/20/17 1529    Clinical Impression Statement  pain in left knee with ex today so tried estim and ice at end of sessison. hip is am issue but knees are getting stronger and working towards goals    PT Treatment/Interventions  ADLs/Self Care Home Management;Cryotherapy;Electrical Stimulation;Iontophoresis 469mml Dexamethasone;Moist Heat;Gait training;Neuromuscular re-education;Balance training;Therapeutic exercise;Therapeutic activities;Functional mobility training;Stair training;Patient/family education;Manual techniques    PT Next Visit Plan  assess and progress       Patient will benefit from skilled therapeutic intervention in order to improve the following deficits and impairments:  Abnormal gait, Decreased range of motion, Difficulty walking, Decreased endurance, Decreased activity  tolerance, Pain, Impaired flexibility, Decreased strength, Decreased mobility  Visit Diagnosis: Acute pain of right knee  Difficulty in walking, not elsewhere classified  Acute pain of left knee     Problem List Patient Active  Problem List   Diagnosis Date Noted  . History of CVA in adulthood 08/13/2017  . Blurry vision 08/13/2017  . Hypertension 01/02/2017  . S/P minimally invasive aortic valve replacement with bioprosthetic valve 08/23/2016  . Chronic diastolic congestive heart failure ([place])   . Osteopenia 05/27/2016  . OSA (obstructive sleep apnea) 02/27/2016  . Bruxism 10/23/2015  . Elevated alkaline phosphatase level 05/16/2015  . Atypical chest pain 11/10/2014  . Acne 05/12/2014  . Diabetes mellitus type 2, controlled ([place]) 05/12/2014  . GERD (gastroesophageal reflux disease) 03/10/2014  . Generalized anxiety disorder 03/10/2014  . Migraines 03/10/2014  . EUSTACHIAN TUBE DYSFUNCTION, CHRONIC 06/14/2010  . Irritable bowel syndrome 12/15/2008  . OBESITY, MORBID 10/12/2008  . Low back pain 08/11/2008  . Hyperlipidemia 02/09/2008  . Allergic rhinitis 02/12/2007  . ASTHMA 02/12/2007    PAYSEUR,ANGIE PTA 11/20/2017, 3:31 PM  [place], [place], [address] Phone: [phone]   Fax:  [phone]  Name: JAUNICE MIRZA MRN: [id number] Date of Birth: 1951/03/22

## 2017-11-21 ENCOUNTER — Ambulatory Visit: Payer: Medicare Other | Admitting: Family Medicine

## 2017-11-21 ENCOUNTER — Encounter: Payer: Self-pay | Admitting: Family Medicine

## 2017-11-25 ENCOUNTER — Ambulatory Visit: Payer: Medicare Other | Admitting: Family Medicine

## 2017-11-25 ENCOUNTER — Encounter: Payer: Self-pay | Admitting: Family Medicine

## 2017-11-25 ENCOUNTER — Ambulatory Visit: Payer: Medicare Other | Attending: Orthopedic Surgery | Admitting: Physical Therapy

## 2017-11-25 VITALS — BP 102/70 | HR 61 | Temp 99.3°F | Ht 66.0 in | Wt 230.6 lb

## 2017-11-25 DIAGNOSIS — I10 Essential (primary) hypertension: Secondary | ICD-10-CM

## 2017-11-25 DIAGNOSIS — E785 Hyperlipidemia, unspecified: Secondary | ICD-10-CM | POA: Diagnosis not present

## 2017-11-25 DIAGNOSIS — R262 Difficulty in walking, not elsewhere classified: Secondary | ICD-10-CM | POA: Diagnosis present

## 2017-11-25 DIAGNOSIS — M25561 Pain in right knee: Secondary | ICD-10-CM | POA: Diagnosis present

## 2017-11-25 DIAGNOSIS — E1121 Type 2 diabetes mellitus with diabetic nephropathy: Secondary | ICD-10-CM | POA: Diagnosis not present

## 2017-11-25 DIAGNOSIS — M25562 Pain in left knee: Secondary | ICD-10-CM | POA: Insufficient documentation

## 2017-11-25 LAB — POCT GLYCOSYLATED HEMOGLOBIN (HGB A1C): Hemoglobin A1C: 6.2 % — AB (ref 4.0–5.6)

## 2017-11-25 LAB — COMPREHENSIVE METABOLIC PANEL
ALT: 18 U/L (ref 0–35)
AST: 26 U/L (ref 0–37)
Albumin: 4.6 g/dL (ref 3.5–5.2)
Alkaline Phosphatase: 146 U/L — ABNORMAL HIGH (ref 39–117)
BUN: 16 mg/dL (ref 6–23)
CO2: 25 mEq/L (ref 19–32)
Calcium: 9.4 mg/dL (ref 8.4–10.5)
Chloride: 97 mEq/L (ref 96–112)
Creatinine, Ser: 0.54 mg/dL (ref 0.40–1.20)
GFR: 119.69 mL/min (ref >60.00)
Glucose, Bld: 118 mg/dL — ABNORMAL HIGH (ref 70–99)
Potassium: 4.4 mEq/L (ref 3.5–5.1)
Sodium: 130 mEq/L — ABNORMAL LOW (ref 135–145)
Total Bilirubin: 0.5 mg/dL (ref 0.2–1.2)
Total Protein: 7.1 g/dL (ref 6.0–8.3)

## 2017-11-25 LAB — LDL CHOLESTEROL, DIRECT: Direct LDL: 58 mg/dL

## 2017-11-25 NOTE — Therapy (Signed)
[place], [place], [address] Phone: [phone]   Fax:  [phone]  Physical Therapy Treatment  Patient Details  Name: [name] MRN: [id number] Date of Birth: Apr 27, 1951 Referring Provider: T. Percell Miller   Encounter Date: 11/25/2017  PT End of Session - 11/25/17 1433    Visit Number  6    Date for PT Re-Evaluation  01/05/18    PT Start Time  1430    PT Stop Time  1527    PT Time Calculation (min)  57 min    Activity Tolerance  Patient tolerated treatment well    Behavior During Therapy  [facility] for tasks assessed/performed       Past Medical History:  Diagnosis Date  . Allergy   . Anxiety   . Arthritis    back & knee  . Asthma     mild per pt shows up with resp illness  . Chronic diastolic congestive heart failure ([place])   . Colon polyps   . Diabetes mellitus without complication ([place])   . Gastric polyps   . Gastroparesis   . GERD (gastroesophageal reflux disease)   . Headache    sinus headaches and migraines at times  . Heart murmur   . History of migraine headaches   . Hyperlipidemia   . IBS (irritable bowel syndrome)   . Lumbar disc disease   . S/P aortic valve replacement with bioprosthetic valve 08/23/2016   25 mm Edwards Intuity rapid-deployment bovine pericardial tissue valve via partial upper mini sternotomy  . Sleep apnea     Past Surgical History:  Procedure Laterality Date  . ABDOMINAL HYSTERECTOMY    . AORTIC VALVE REPLACEMENT N/A 08/23/2016   Procedure: AORTIC VALVE REPLACEMENT (AVR) - using partial Upper Sternotomy- 33m Edwards Intuity Aortic Valve used;  Surgeon: CRexene Alberts MD;  [facility]  Service: Open Heart Surgery;  Laterality: N/A;  . BLADDER SUSPENSION  2007  . BREAST ENHANCEMENT SURGERY  1980  . CARPAL TUNNEL RELEASE Bilateral   . COLONOSCOPY    . DORSAL COMPARTMENT RELEASE Right 09/15/2014   Procedure: RIGHT WRIST DEQUERVAINS RELEASE ;   Surgeon: DKathryne Hitch MD;  Location: [facility]  Service: Orthopedics;  Laterality: Right;  . ESOPHAGOGASTRODUODENOSCOPY    . KNEE ARTHROSCOPY  04/15/2012   Procedure: ARTHROSCOPY KNEE;  Surgeon: DNinetta Lights MD;  [facility]  Service: Orthopedics;  Laterality: Right;  . LAPAROSCOPIC CHOLECYSTECTOMY    . LASIK    . OOPHORECTOMY    . PALATE TO GINGIVA GRAFT  2017  . RIGHT/LEFT HEART CATH AND CORONARY ANGIOGRAPHY N/A 08/02/2016   Procedure: Right/Left Heart Cath and Coronary Angiography;  Surgeon: MSherren Mocha MD;  [facility];  Service: Cardiovascular;  Laterality: N/A;  . ROTATOR CUFF REPAIR Left   . TEE WITHOUT CARDIOVERSION N/A 08/23/2016   Procedure: TRANSESOPHAGEAL ECHOCARDIOGRAM (TEE);  Surgeon: CRexene Alberts MD;  [facility]  Service: Open Heart Surgery;  Laterality: N/A;  . TOTAL KNEE ARTHROPLASTY  04/15/2012   Procedure: TOTAL KNEE ARTHROPLASTY;  Surgeon: DNinetta Lights MD;  Location: [facility]  Service: Orthopedics;  Laterality: Right;  . TRIGGER FINGER RELEASE Right 04/20/2015   Procedure: RIGHT TRIGGER FINGER RELEASE (TENDON SHEALTH INCISION) ;  Surgeon: DNinetta Lights MD;  [place]  Service: Orthopedics;  Laterality: Right;    There were no vitals filed for this visit.  Subjective Assessment - 11/25/17 1436    Subjective  "so far so good today" Left hip is still hurting.    Patient Stated Goals  have less pain    Currently in Pain?  No/denies         [facility] PT Assessment - 11/25/17 0001      Assessment   Next MD Visit  12/10/17                   [facility] Adult PT Treatment/Exercise - 11/25/17 0001      Knee/Hip Exercises: Aerobic   Recumbent Bike  L1 x 6 min    Nustep  Level 5 x 13mnutes      Knee/Hip Exercises: Machines for Strengthening   Cybex Knee Extension  5# 3x10    Cybex Knee Flexion  25# 3x10    Cybex Leg Press  30# 3x10      Knee/Hip Exercises: Standing   Walking  with Sports Cord  resisted gait all directions 20# each stack      Modalities   Modalities  Electrical Stimulation;Cryotherapy      Cryotherapy   Number Minutes Cryotherapy  15 Minutes    Cryotherapy Location  Knee    Type of Cryotherapy  Ice pack      Electrical Stimulation   Electrical Stimulation Location  left knee    Electrical Stimulation Action  premod    Electrical Stimulation Goals  Pain               PT Short Term Goals - 11/20/17 1530      PT SHORT TERM GOAL #1   Title  independent with intial HEP    Status  Achieved        PT Long Term Goals - 11/25/17 1516      PT LONG TERM GOAL #1   Title  decrease pain 50%    Time  8    Period  Weeks    Status  Partially Met      PT LONG TERM GOAL #2   Title  returns safely to the gym    Status  Partially Met      PT LONG TERM GOAL #3   Title  go up and down steps without difficulty    Baseline  still has hip pain    Time  8    Period  Weeks    Status  Achieved      PT LONG TERM GOAL #4   Title  report no difficukty getting up from sitting    Time  8    Period  Weeks    Status  Achieved            Plan - 11/25/17 1513    Clinical Impression Statement  good tolerance to TE today.    PT Treatment/Interventions  ADLs/Self Care Home Management;Cryotherapy;Electrical Stimulation;Iontophoresis 462mml Dexamethasone;Moist Heat;Gait training;Neuromuscular re-education;Balance training;Therapeutic exercise;Therapeutic activities;Functional mobility training;Stair training;Patient/family education;Manual techniques    PT Next Visit Plan  assess and progress       Patient will benefit from skilled therapeutic intervention in order to improve the following deficits and impairments:  Abnormal gait, Decreased range of motion, Difficulty walking, Decreased endurance, Decreased activity tolerance, Pain, Impaired flexibility, Decreased strength, Decreased mobility  Visit Diagnosis: Acute pain of right  knee  Difficulty in walking, not elsewhere classified  Acute pain of left knee     Problem List Patient Active Problem List   Diagnosis Date Noted  .  History of CVA in adulthood 08/13/2017  . Blurry vision 08/13/2017  . Hypertension 01/02/2017  . S/P minimally invasive aortic valve replacement with bioprosthetic valve 08/23/2016  . Chronic diastolic congestive heart failure ([place])   . Osteopenia 05/27/2016  . OSA (obstructive sleep apnea) 02/27/2016  . Bruxism 10/23/2015  . Elevated alkaline phosphatase level 05/16/2015  . Atypical chest pain 11/10/2014  . Acne 05/12/2014  . Diabetes mellitus type 2, controlled ([place]) 05/12/2014  . GERD (gastroesophageal reflux disease) 03/10/2014  . Generalized anxiety disorder 03/10/2014  . Migraines 03/10/2014  . EUSTACHIAN TUBE DYSFUNCTION, CHRONIC 06/14/2010  . Irritable bowel syndrome 12/15/2008  . OBESITY, MORBID 10/12/2008  . Low back pain 08/11/2008  . Hyperlipidemia 02/09/2008  . Allergic rhinitis 02/12/2007  . ASTHMA 02/12/2007    Wilhelmina Hark PT 11/25/2017, 3:17 PM  [place], [place], [address] Phone: [phone]   Fax:  [phone]  Name: EMALYN SCHOU MRN: [id number] Date of Birth: December 27, 1950

## 2017-11-25 NOTE — Progress Notes (Signed)
Subjective:  [name] is a 67 y.o. year old very pleasant female patient who presents for/with See problem oriented charting ROS- no increased blurry vision. No hypoglycemia, no increased shortness of breath or edema   Past Medical History-  Patient Active Problem List   Diagnosis Date Noted  . History of CVA in adulthood 08/13/2017    Priority: High  . S/P minimally invasive aortic valve replacement with bioprosthetic valve 08/23/2016    Priority: High  . Chronic diastolic congestive heart failure ([place])     Priority: High  . Diabetes mellitus type 2, controlled ([place]) 05/12/2014    Priority: High  . Hypertension 01/02/2017    Priority: Medium  . Osteopenia 05/27/2016    Priority: Medium  . OSA (obstructive sleep apnea) 02/27/2016    Priority: Medium  . Elevated alkaline phosphatase level 05/16/2015    Priority: Medium  . GERD (gastroesophageal reflux disease) 03/10/2014    Priority: Medium  . Generalized anxiety disorder 03/10/2014    Priority: Medium  . Migraines 03/10/2014    Priority: Medium  . Irritable bowel syndrome 12/15/2008    Priority: Medium  . OBESITY, MORBID 10/12/2008    Priority: Medium  . Hyperlipidemia 02/09/2008    Priority: Medium  . Bruxism 10/23/2015    Priority: Low  . Atypical chest pain 11/10/2014    Priority: Low  . Acne 05/12/2014    Priority: Low  . EUSTACHIAN TUBE DYSFUNCTION, CHRONIC 06/14/2010    Priority: Low  . Low back pain 08/11/2008    Priority: Low  . Allergic rhinitis 02/12/2007    Priority: Low  . ASTHMA 02/12/2007    Priority: Low  . Blurry vision 08/13/2017    Medications- reviewed and updated Current Outpatient Medications  Medication Sig Dispense Refill  . atorvastatin (LIPITOR) 40 MG tablet Take 1 tablet (40 mg total) by mouth daily at 6 PM. 90 tablet 3  . Biotin 5000 MCG TABS Take 5,000 mcg by mouth daily.     . calcium-vitamin D (OSCAL WITH D) 500-200 MG-UNIT per tablet Take 2 tablets by mouth daily.      . clindamycin (CLEOCIN) 300 MG capsule TAKE 2 CAPSULES BY MOUTH 1 HOUR PRIOR TOO DENTAL CLEANING. 2 capsule 0  . clopidogrel (PLAVIX) 75 MG tablet Take 1 tablet (75 mg total) by mouth daily. 90 tablet 3  . fluticasone (FLONASE) 50 MCG/ACT nasal spray Place 2 sprays into both nostrils daily.     . furosemide (LASIX) 40 MG tablet Take 1 tablet (40 mg total) by mouth daily. 60 tablet 11  . glucose blood (ONETOUCH VERIO) test strip Use to test blood sugars daily. Dx: E11.9 100 each 12  . Lancets (ONETOUCH ULTRASOFT) lancets Use to test blood sugars daily. Dx: E11.9 100 each 12  . loratadine (CLARITIN) 10 MG tablet Take 10 mg by mouth at bedtime.     . metFORMIN (GLUCOPHAGE) 500 MG tablet TAKE 1 TABLET WITH BREAKFAST AND 1/2 TO 1 TABLET WITH DINNER. 180 tablet 1  . metoCLOPramide (REGLAN) 10 MG tablet TAKE 1/2 TABLET BY MOUTH 4 TIMES A DAY 60 tablet 5  . metoprolol tartrate (LOPRESSOR) 25 MG tablet Take 0.5 tablets (12.5 mg total) by mouth 2 (two) times daily. 60 tablet 11  . Multiple Vitamin (MULTIVITAMIN WITH MINERALS) TABS tablet Take 1 tablet by mouth daily.    . naproxen sodium (ALEVE) 220 MG tablet Take 220 mg by mouth.    [name] omeprazole (PRILOSEC OTC) 20 MG tablet Take 40 mg by  mouth daily with breakfast.    . potassium chloride SA (K-DUR,KLOR-CON) 20 MEQ tablet Take 1 tablet (20 mEq total) by mouth daily. 60 tablet 11  . Probiotic Product (ALIGN) 4 MG CAPS Take 4 mg by mouth daily.     . psyllium (METAMUCIL SMOOTH TEXTURE) 28 % packet Take 1 packet by mouth daily before breakfast.     . venlafaxine XR (EFFEXOR-XR) 150 MG 24 hr capsule TAKE ONE CAPSULE BY MOUTH DAILY WITH BREAKFAST 90 capsule 1   Objective: BP 102/70 (BP Location: Left Arm, Patient Position: Sitting, Cuff Size: Large)   Pulse 61   Temp 99.3 F (37.4 C) (Oral)   Ht 5\' 6"  (1.676 m)   Wt 230 lb 9.6 oz (104.6 kg)   SpO2 97%   BMI 37.22 kg/m  Gen: NAD, resting comfortably Bilateral TM normal CV: RRR no murmurs rubs or  gallops Lungs: CTAB no crackles, wheeze, rhonchi Abdomen: soft/nontender/nondistended/normal bowel sounds.   Ext: no edema Skin: warm, dry  Assessment/Plan:  Other notes: 1. on waiting list for weight to wellness clinic- will be called in June or July 2. Intermittent right ear pain- look fine on exam today- possibly pressure/congestion related at times.  3. Interested in hip injection for bursitis- her orthopedist doesn't do these while on plavix- we discussed possible referral to Dr. Paulla Fore in our clinic 4. Hot flashes - has tried HRT in past and not super helpful. Discussed other options- ultimately encouraged GYN follow up.  5. Deep dry cough at times. Allergist. Asthma, on allergy meds, GERD on rx. Lungs clear today- monitor only  Diabetes mellitus type 2, controlled ([place]) S:  controlled on metformin 500mg  BID. Over last 2 months has lost 3 lbs. No hypoglycemia Lab Results  Component Value Date   HGBA1C 6.2 (A) 11/25/2017   HGBA1C 6.4 05/23/2017   HGBA1C 6.7 (H) 01/02/2017   A/P: continue current medicines.  Will need to monitor CBGs when starts weight loss clinic as may have to reduce medicine  Hyperlipidemia S:  controlled on atorvastatin 40 mg with LDL of 58 today!   A/P: continue current rx  Hypertension S: controlled on lasix 40mg  and metoprolol 12.5mg  BID- though primarily for cardiac pruposes BP Readings from Last 3 Encounters:  11/25/17 102/70  10/07/17 138/82  09/29/17 108/62  A/P:  blood pressure goal of <140/90. Continue current meds   Future Appointments  Date Time Provider [place]  11/27/2017  4:15 PM Payseur, Dionne Bucy, PTA [facility]  12/02/2017  2:00 PM Payseur, Dionne Bucy, PTA [facility]  12/04/2017  3:30 PM Payseur, Dionne Bucy, PTA [facility]  12/18/2017  1:00 PM Williemae Area, RN [facility]  12/24/2017  1:30 PM Pieter Partridge, DO [facility] None  06/04/2018  9:30 AM [name], MD [facility]  10/02/2018  3:00 PM Parrett,  Fonnie Mu, NP [facility] None   6 month follow up planned. Could be CPE  Lab/Order associations: Controlled type 2 diabetes mellitus with diabetic nephropathy, without long-term current use of insulin ([place]) - Plan: POCT glycosylated hemoglobin (Hb A1C)  Hyperlipidemia, unspecified hyperlipidemia type - Plan: LDL cholesterol, direct, Comprehensive metabolic panel  Return precautions advised.  Garret Reddish, MD

## 2017-11-25 NOTE — Progress Notes (Signed)
Please let us know by responding to this when you get this mychart message and let us know if you understand the results/directions  Your CMET was largely normal (kidney, liver, and electrolytes, blood sugar). You did have slightly low sodium but you tend to live there and largely stable. Blood sugar consistent with diabetes.  Your cholesterol Looks great. We already discussed That your hemoglobin A1c looks great at 6.2.

## 2017-11-25 NOTE — Assessment & Plan Note (Signed)
S:  controlled on atorvastatin 40 mg with LDL of 58 today!   A/P: continue current rx

## 2017-11-25 NOTE — Assessment & Plan Note (Signed)
S:  controlled on metformin 500mg  BID. Over last 2 months has lost 3 lbs. No hypoglycemia Lab Results  Component Value Date   HGBA1C 6.2 (A) 11/25/2017   HGBA1C 6.4 05/23/2017   HGBA1C 6.7 (H) 01/02/2017   A/P: continue current medicines.  Will need to monitor CBGs when starts weight loss clinic as may have to reduce medicine

## 2017-11-25 NOTE — Assessment & Plan Note (Signed)
S: controlled on lasix 40mg  and metoprolol 12.5mg  BID- though primarily for cardiac pruposes BP Readings from Last 3 Encounters:  11/25/17 102/70  10/07/17 138/82  09/29/17 108/62  A/P:  blood pressure goal of <140/90. Continue current meds

## 2017-11-25 NOTE — Patient Instructions (Addendum)
Please stop by lab before you go  Double check with Dr. Paulla Fore about injection  No other changes today

## 2017-11-26 ENCOUNTER — Encounter: Payer: Self-pay | Admitting: Family Medicine

## 2017-11-27 ENCOUNTER — Ambulatory Visit: Payer: Medicare Other | Admitting: Physical Therapy

## 2017-11-27 ENCOUNTER — Encounter: Payer: Self-pay | Admitting: Physical Therapy

## 2017-11-27 DIAGNOSIS — M25562 Pain in left knee: Secondary | ICD-10-CM

## 2017-11-27 DIAGNOSIS — M25561 Pain in right knee: Secondary | ICD-10-CM | POA: Diagnosis not present

## 2017-11-27 DIAGNOSIS — R262 Difficulty in walking, not elsewhere classified: Secondary | ICD-10-CM

## 2017-11-27 NOTE — Therapy (Signed)
[place], [place], [address] Phone: [phone]   Fax:  [phone]  Physical Therapy Treatment  Patient Details  Name: [name] MRN: [id number] Date of Birth: June 20, 1951 Referring Provider: T. Percell Miller   Encounter Date: 11/27/2017  PT End of Session - 11/27/17 1653    Visit Number  7    Date for PT Re-Evaluation  01/05/18    PT Start Time  0254    PT Stop Time  2706    PT Time Calculation (min)  60 min       Past Medical History:  Diagnosis Date  . Allergy   . Anxiety   . Arthritis    back & knee  . Asthma     mild per pt shows up with resp illness  . Chronic diastolic congestive heart failure ([place])   . Colon polyps   . Diabetes mellitus without complication ([place])   . Gastric polyps   . Gastroparesis   . GERD (gastroesophageal reflux disease)   . Headache    sinus headaches and migraines at times  . Heart murmur   . History of migraine headaches   . Hyperlipidemia   . IBS (irritable bowel syndrome)   . Lumbar disc disease   . S/P aortic valve replacement with bioprosthetic valve 08/23/2016   25 mm Edwards Intuity rapid-deployment bovine pericardial tissue valve via partial upper mini sternotomy  . Sleep apnea     Past Surgical History:  Procedure Laterality Date  . ABDOMINAL HYSTERECTOMY    . AORTIC VALVE REPLACEMENT N/A 08/23/2016   Procedure: AORTIC VALVE REPLACEMENT (AVR) - using partial Upper Sternotomy- 55m Edwards Intuity Aortic Valve used;  Surgeon: CRexene Alberts MD;  [facility]  Service: Open Heart Surgery;  Laterality: N/A;  . BLADDER SUSPENSION  2007  . BREAST ENHANCEMENT SURGERY  1980  . CARPAL TUNNEL RELEASE Bilateral   . COLONOSCOPY    . DORSAL COMPARTMENT RELEASE Right 09/15/2014   Procedure: RIGHT WRIST DEQUERVAINS RELEASE ;  Surgeon: DKathryne Hitch MD;  [facility]  Service: Orthopedics;  Laterality: Right;  .  ESOPHAGOGASTRODUODENOSCOPY    . KNEE ARTHROSCOPY  04/15/2012   Procedure: ARTHROSCOPY KNEE;  Surgeon: DNinetta Lights MD;  [facility]  Service: Orthopedics;  Laterality: Right;  . LAPAROSCOPIC CHOLECYSTECTOMY    . LASIK    . OOPHORECTOMY    . PALATE TO GINGIVA GRAFT  2017  . RIGHT/LEFT HEART CATH AND CORONARY ANGIOGRAPHY N/A 08/02/2016   Procedure: Right/Left Heart Cath and Coronary Angiography;  Surgeon: MSherren Mocha MD;  [facility];  Service: Cardiovascular;  Laterality: N/A;  . ROTATOR CUFF REPAIR Left   . TEE WITHOUT CARDIOVERSION N/A 08/23/2016   Procedure: TRANSESOPHAGEAL ECHOCARDIOGRAM (TEE);  Surgeon: CRexene Alberts MD;  [facility]  Service: Open Heart Surgery;  Laterality: N/A;  . TOTAL KNEE ARTHROPLASTY  04/15/2012   Procedure: TOTAL KNEE ARTHROPLASTY;  Surgeon: DNinetta Lights MD;  [facility]  Service: Orthopedics;  Laterality: Right;  . TRIGGER FINGER RELEASE Right 04/20/2015   Procedure: RIGHT TRIGGER FINGER RELEASE (TENDON SHEALTH INCISION) ;  Surgeon: DNinetta Lights MD;  [facility]  Service: Orthopedics;  Laterality: Right;    There were no vitals filed for this visit.  Subjective Assessment - 11/27/17 1612    Subjective  pain down left side from back to hip and knee esp  with STS and steps    Currently in Pain?  Yes    Pain Score  8     Pain Location  Leg    Pain Orientation  Left                       [facility] Adult PT Treatment/Exercise - 11/27/17 0001      Knee/Hip Exercises: Aerobic   Nustep  Level 5 x 65mnutes      Knee/Hip Exercises: Machines for Strengthening   Cybex Knee Extension  10# 3x10    Cybex Knee Flexion  25# 3x10    Cybex Leg Press  30# 3x10    Other Machine  lats and rows 20# 2 set s 10 black tband ext 20 times      Knee/Hip Exercises: Standing   Other Standing Knee Exercises  red tband 15each,  hip 3 way    Other Standing Knee Exercises  wt ball ext and obl 15  each      Cryotherapy   Number Minutes Cryotherapy  15 Minutes    Cryotherapy Location  Knee    Type of Cryotherapy  Ice pack      Electrical Stimulation   Electrical Stimulation Location  left knee and hip    Electrical Stimulation Action  premod    Electrical Stimulation Goals  Pain               PT Short Term Goals - 11/20/17 1530      PT SHORT TERM GOAL #1   Title  independent with intial HEP    Status  Achieved        PT Long Term Goals - 11/27/17 1654      PT LONG TERM GOAL #1   Title  decrease pain 50%    Status  Partially Met      PT LONG TERM GOAL #2   Title  returns safely to the gym    Status  Partially Met      PT LONG TERM GOAL #3   Title  go up and down steps without difficulty    Status  Partially Met      PT LONG TERM GOAL #4   Title  report no difficukty getting up from sitting    Status  Achieved            Plan - 11/27/17 1653    Clinical Impression Statement  added hip and back strengthening ex as she complained  of pain from back to knee left side pain an d she tolerated well. cuing with ex for posture. progressing with goals    PT Treatment/Interventions  ADLs/Self Care Home Management;Cryotherapy;Electrical Stimulation;Iontophoresis 434mml Dexamethasone;Moist Heat;Gait training;Neuromuscular re-education;Balance training;Therapeutic exercise;Therapeutic activities;Functional mobility training;Stair training;Patient/family education;Manual techniques    PT Next Visit Plan  assess and progress       Patient will benefit from skilled therapeutic intervention in order to improve the following deficits and impairments:  Abnormal gait, Decreased range of motion, Difficulty walking, Decreased endurance, Decreased activity tolerance, Pain, Impaired flexibility, Decreased strength, Decreased mobility  Visit Diagnosis: Acute pain of right knee  Difficulty in walking, not elsewhere classified  Acute pain of left knee     Problem  List Patient Active Problem List   Diagnosis Date Noted  . History of CVA in adulthood 08/13/2017  . Blurry vision 08/13/2017  . Hypertension 01/02/2017  . S/P minimally invasive aortic valve replacement with bioprosthetic valve 08/23/2016  . Chronic  diastolic congestive heart failure ([place])   . Osteopenia 05/27/2016  . OSA (obstructive sleep apnea) 02/27/2016  . Bruxism 10/23/2015  . Elevated alkaline phosphatase level 05/16/2015  . Atypical chest pain 11/10/2014  . Acne 05/12/2014  . Diabetes mellitus type 2, controlled ([place]) 05/12/2014  . GERD (gastroesophageal reflux disease) 03/10/2014  . Generalized anxiety disorder 03/10/2014  . Migraines 03/10/2014  . EUSTACHIAN TUBE DYSFUNCTION, CHRONIC 06/14/2010  . Irritable bowel syndrome 12/15/2008  . OBESITY, MORBID 10/12/2008  . Low back pain 08/11/2008  . Hyperlipidemia 02/09/2008  . Allergic rhinitis 02/12/2007  . ASTHMA 02/12/2007    Lynore Coscia,ANGIE PTA 11/27/2017, 4:58 PM  [place] [address] [place], [place], [address] Phone: [phone]   Fax:  [phone]  Name: [name] MRN: [id number] Date of Birth: Aug 17, 1950

## 2017-12-02 ENCOUNTER — Ambulatory Visit: Payer: Medicare Other | Admitting: Physical Therapy

## 2017-12-02 DIAGNOSIS — M25561 Pain in right knee: Secondary | ICD-10-CM | POA: Diagnosis not present

## 2017-12-02 DIAGNOSIS — M25562 Pain in left knee: Secondary | ICD-10-CM

## 2017-12-02 DIAGNOSIS — R262 Difficulty in walking, not elsewhere classified: Secondary | ICD-10-CM

## 2017-12-02 NOTE — Therapy (Signed)
[place], [place], [address] Phone: [phone]   Fax:  [phone]  Physical Therapy Treatment  Patient Details  Name: [name] MRN: [id number] Date of Birth: Feb 15, 1951 Referring Provider: T. Percell Miller   Encounter Date: 12/02/2017  PT End of Session - 12/02/17 1427    Visit Number  8    Date for PT Re-Evaluation  01/05/18    PT Start Time  2536    PT Stop Time  1445    PT Time Calculation (min)  50 min       Past Medical History:  Diagnosis Date  . Allergy   . Anxiety   . Arthritis    back & knee  . Asthma     mild per pt shows up with resp illness  . Chronic diastolic congestive heart failure ([place])   . Colon polyps   . Diabetes mellitus without complication ([place])   . Gastric polyps   . Gastroparesis   . GERD (gastroesophageal reflux disease)   . Headache    sinus headaches and migraines at times  . Heart murmur   . History of migraine headaches   . Hyperlipidemia   . IBS (irritable bowel syndrome)   . Lumbar disc disease   . S/P aortic valve replacement with bioprosthetic valve 08/23/2016   25 mm Edwards Intuity rapid-deployment bovine pericardial tissue valve via partial upper mini sternotomy  . Sleep apnea     Past Surgical History:  Procedure Laterality Date  . ABDOMINAL HYSTERECTOMY    . AORTIC VALVE REPLACEMENT N/A 08/23/2016   Procedure: AORTIC VALVE REPLACEMENT (AVR) - using partial Upper Sternotomy- 40m Edwards Intuity Aortic Valve used;  Surgeon: CRexene Alberts MD;  [facility]  Service: Open Heart Surgery;  Laterality: N/A;  . BLADDER SUSPENSION  2007  . BREAST ENHANCEMENT SURGERY  1980  . CARPAL TUNNEL RELEASE Bilateral   . COLONOSCOPY    . DORSAL COMPARTMENT RELEASE Right 09/15/2014   Procedure: RIGHT WRIST DEQUERVAINS RELEASE ;  Surgeon: DKathryne Hitch MD;  [facility]  Service: Orthopedics;  Laterality: Right;  .  ESOPHAGOGASTRODUODENOSCOPY    . KNEE ARTHROSCOPY  04/15/2012   Procedure: ARTHROSCOPY KNEE;  Surgeon: DNinetta Lights MD;  [facility]  Service: Orthopedics;  Laterality: Right;  . LAPAROSCOPIC CHOLECYSTECTOMY    . LASIK    . OOPHORECTOMY    . PALATE TO GINGIVA GRAFT  2017  . RIGHT/LEFT HEART CATH AND CORONARY ANGIOGRAPHY N/A 08/02/2016   Procedure: Right/Left Heart Cath and Coronary Angiography;  Surgeon: MSherren Mocha MD;  [facility];  Service: Cardiovascular;  Laterality: N/A;  . ROTATOR CUFF REPAIR Left   . TEE WITHOUT CARDIOVERSION N/A 08/23/2016   Procedure: TRANSESOPHAGEAL ECHOCARDIOGRAM (TEE);  Surgeon: CRexene Alberts MD;  [facility]  Service: Open Heart Surgery;  Laterality: N/A;  . TOTAL KNEE ARTHROPLASTY  04/15/2012   Procedure: TOTAL KNEE ARTHROPLASTY;  Surgeon: DNinetta Lights MD;  [facility]  Service: Orthopedics;  Laterality: Right;  . TRIGGER FINGER RELEASE Right 04/20/2015   Procedure: RIGHT TRIGGER FINGER RELEASE (TENDON SHEALTH INCISION) ;  Surgeon: DNinetta Lights MD;  [facility]  Service: Orthopedics;  Laterality: Right;    There were no vitals filed for this visit.  Subjective Assessment - 12/02/17 1357    Subjective  left hip and lat knee pain, down leg  Currently in Pain?  Yes    Pain Score  5     Pain Location  Leg    Pain Orientation  Left                       [facility] Adult PT Treatment/Exercise - 12/02/17 0001      Knee/Hip Exercises: Aerobic   Nustep  Level 5 x 55mnutes      Knee/Hip Exercises: Machines for Strengthening   Cybex Knee Extension  10# 3x10    Cybex Knee Flexion  25# 3x10    Cybex Leg Press  30# 3x12 feet 3 way      Knee/Hip Exercises: Standing   Other Standing Knee Exercises  red tband 15each,  hip 3 way on airex      Cryotherapy   Number Minutes Cryotherapy  15 Minutes    Cryotherapy Location  Knee    Type of Cryotherapy  Ice pack      Electrical  Stimulation   Electrical Stimulation Location  left knee and hip    Electrical Stimulation Action  premod    Electrical Stimulation Goals  Pain      Manual Therapy   Manual Therapy  Soft tissue mobilization    Manual therapy comments  Left ITB very tender with multi trigger points    Soft tissue mobilization  ITB rolling               PT Short Term Goals - 11/20/17 1530      PT SHORT TERM GOAL #1   Title  independent with intial HEP    Status  Achieved        PT Long Term Goals - 11/27/17 1654      PT LONG TERM GOAL #1   Title  decrease pain 50%    Status  Partially Met      PT LONG TERM GOAL #2   Title  returns safely to the gym    Status  Partially Met      PT LONG TERM GOAL #3   Title  go up and down steps without difficulty    Status  Partially Met      PT LONG TERM GOAL #4   Title  report no difficukty getting up from sitting    Status  Achieved            Plan - 12/02/17 1427    Clinical Impression Statement  pt verb inprovements all except left lat hip and kne pain. after furtehr assessment ITB bery tender so did rolling and STW .     PT Treatment/Interventions  ADLs/Self Care Home Management;Cryotherapy;Electrical Stimulation;Iontophoresis 465mml Dexamethasone;Moist Heat;Gait training;Neuromuscular re-education;Balance training;Therapeutic exercise;Therapeutic activities;Functional mobility training;Stair training;Patient/family education;Manual techniques    PT Next Visit Plan  assess ITB work and write MD note       Patient will benefit from skilled therapeutic intervention in order to improve the following deficits and impairments:  Abnormal gait, Decreased range of motion, Difficulty walking, Decreased endurance, Decreased activity tolerance, Pain, Impaired flexibility, Decreased strength, Decreased mobility  Visit Diagnosis: Acute pain of right knee  Difficulty in walking, not elsewhere classified  Acute pain of left  knee     Problem List Patient Active Problem List   Diagnosis Date Noted  . History of CVA in adulthood 08/13/2017  . Blurry vision 08/13/2017  . Hypertension 01/02/2017  . S/P minimally invasive aortic valve replacement with bioprosthetic valve 08/23/2016  . Chronic diastolic  congestive heart failure ([place])   . Osteopenia 05/27/2016  . OSA (obstructive sleep apnea) 02/27/2016  . Bruxism 10/23/2015  . Elevated alkaline phosphatase level 05/16/2015  . Atypical chest pain 11/10/2014  . Acne 05/12/2014  . Diabetes mellitus type 2, controlled ([place]) 05/12/2014  . GERD (gastroesophageal reflux disease) 03/10/2014  . Generalized anxiety disorder 03/10/2014  . Migraines 03/10/2014  . EUSTACHIAN TUBE DYSFUNCTION, CHRONIC 06/14/2010  . Irritable bowel syndrome 12/15/2008  . OBESITY, MORBID 10/12/2008  . Low back pain 08/11/2008  . Hyperlipidemia 02/09/2008  . Allergic rhinitis 02/12/2007  . ASTHMA 02/12/2007    Krystian Younglove,ANGIE PTA 12/02/2017, 2:32 PM  [place], [place], [address] Phone: [phone]   Fax:  [phone]  Name: [name] MRN: [id number] Date of Birth: [birth year]

## 2017-12-04 ENCOUNTER — Encounter: Payer: Self-pay | Admitting: Physical Therapy

## 2017-12-04 ENCOUNTER — Ambulatory Visit: Payer: Medicare Other | Admitting: Physical Therapy

## 2017-12-04 DIAGNOSIS — M25562 Pain in left knee: Secondary | ICD-10-CM

## 2017-12-04 DIAGNOSIS — M25561 Pain in right knee: Secondary | ICD-10-CM

## 2017-12-04 DIAGNOSIS — R262 Difficulty in walking, not elsewhere classified: Secondary | ICD-10-CM

## 2017-12-04 NOTE — Therapy (Signed)
[place], [place], [address] Phone: [phone]   Fax:  [phone]  Physical Therapy Treatment  Patient Details  Name: [name] MRN: [id number] Date of Birth: 06-16-1951 Referring Provider: T. Percell Miller   Encounter Date: 12/04/2017  PT End of Session - 12/04/17 1611    Visit Number  9    Date for PT Re-Evaluation  01/05/18    PT Start Time  2706    PT Stop Time  1630    PT Time Calculation (min)  67 min       Past Medical History:  Diagnosis Date  . Allergy   . Anxiety   . Arthritis    back & knee  . Asthma     mild per pt shows up with resp illness  . Chronic diastolic congestive heart failure ([place])   . Colon polyps   . Diabetes mellitus without complication ([place])   . Gastric polyps   . Gastroparesis   . GERD (gastroesophageal reflux disease)   . Headache    sinus headaches and migraines at times  . Heart murmur   . History of migraine headaches   . Hyperlipidemia   . IBS (irritable bowel syndrome)   . Lumbar disc disease   . S/P aortic valve replacement with bioprosthetic valve 08/23/2016   25 mm Edwards Intuity rapid-deployment bovine pericardial tissue valve via partial upper mini sternotomy  . Sleep apnea     Past Surgical History:  Procedure Laterality Date  . ABDOMINAL HYSTERECTOMY    . AORTIC VALVE REPLACEMENT N/A 08/23/2016   Procedure: AORTIC VALVE REPLACEMENT (AVR) - using partial Upper Sternotomy- 27m Edwards Intuity Aortic Valve used;  Surgeon: CRexene Alberts MD;  [facility]  Service: Open Heart Surgery;  Laterality: N/A;  . BLADDER SUSPENSION  2007  . BREAST ENHANCEMENT SURGERY  1980  . CARPAL TUNNEL RELEASE Bilateral   . COLONOSCOPY    . DORSAL COMPARTMENT RELEASE Right 09/15/2014   Procedure: RIGHT WRIST DEQUERVAINS RELEASE ;  Surgeon: DKathryne Hitch MD;  [facility]  Service: Orthopedics;  Laterality: Right;  .  ESOPHAGOGASTRODUODENOSCOPY    . KNEE ARTHROSCOPY  04/15/2012   Procedure: ARTHROSCOPY KNEE;  Surgeon: DNinetta Lights MD;  [facility]  Service: Orthopedics;  Laterality: Right;  . LAPAROSCOPIC CHOLECYSTECTOMY    . LASIK    . OOPHORECTOMY    . PALATE TO GINGIVA GRAFT  2017  . RIGHT/LEFT HEART CATH AND CORONARY ANGIOGRAPHY N/A 08/02/2016   Procedure: Right/Left Heart Cath and Coronary Angiography;  Surgeon: MSherren Mocha MD;  [facility];  Service: Cardiovascular;  Laterality: N/A;  . ROTATOR CUFF REPAIR Left   . TEE WITHOUT CARDIOVERSION N/A 08/23/2016   Procedure: TRANSESOPHAGEAL ECHOCARDIOGRAM (TEE);  Surgeon: CRexene Alberts MD;  [facility]  Service: Open Heart Surgery;  Laterality: N/A;  . TOTAL KNEE ARTHROPLASTY  04/15/2012   Procedure: TOTAL KNEE ARTHROPLASTY;  Surgeon: DNinetta Lights MD;  [facility]  Service: Orthopedics;  Laterality: Right;  . TRIGGER FINGER RELEASE Right 04/20/2015   Procedure: RIGHT TRIGGER FINGER RELEASE (TENDON SHEALTH INCISION) ;  Surgeon: DNinetta Lights MD;  [facility]  Service: Orthopedics;  Laterality: Right;    There were no vitals filed for this visit.  Subjective Assessment - 12/04/17 1531    Subjective   not doubling over when I stand- tx hurt but helped (  referring to ITB stripping)    Currently in Pain?  Yes    Pain Score  4     Pain Location  Leg    Pain Orientation  Left                       [facility] Adult PT Treatment/Exercise - 12/04/17 0001      Knee/Hip Exercises: Aerobic   Nustep  Level 5 x 66mnutes      Knee/Hip Exercises: Standing   Lateral Step Up  Both;15 reps;Hand Hold: 2;Step Height: 6" opp leg abd    Forward Step Up  Both;15 reps;Hand Hold: 2;Step Height: 6" opp leg ext    Walking with Sports Cord  resisted gait all directions    Other Standing Knee Exercises  fitter 3 way hip flex,ext and abd 15 each      Knee/Hip Exercises: Seated   Clamshell  with TheraBand  Green    Marching  Strengthening;Both;15 reps green tband    Sit to [employer] 10 reps;without UE support on airex      Cryotherapy   Number Minutes Cryotherapy  15 Minutes    Cryotherapy Location  Knee    Type of Cryotherapy  Ice pack      Electrical Stimulation   Electrical Stimulation Location  left knee and hip    Electrical Stimulation Action  premod    Electrical Stimulation Goals  Pain      Manual Therapy   Manual Therapy  Soft tissue mobilization    Manual therapy comments  Left ITB very tender with multi trigger points less than less session    Soft tissue mobilization  ITB rolling               PT Short Term Goals - 11/20/17 1530      PT SHORT TERM GOAL #1   Title  independent with intial HEP    Status  Achieved        PT Long Term Goals - 12/04/17 1613      PT LONG TERM GOAL #1   Title  decrease pain 50%    Status  Partially Met      PT LONG TERM GOAL #2   Title  returns safely to the gym    Baseline  discussed safe gym return and ex plus addition of abd/add machine    Status  Partially Met      PT LONG TERM GOAL #3   Title  go up and down steps without difficulty    Status  Partially Met      PT LONG TERM GOAL #4   Title  report no difficukty getting up from sitting    Status  Achieved            Plan - 12/04/17 1612    Clinical Impression Statement  very fatigued today with standing ex for hips with some discomfort. less trigger pts and pain with ITB rolling today but IT still very tight and causing pain.     PT Treatment/Interventions  ADLs/Self Care Home Management;Cryotherapy;Electrical Stimulation;Iontophoresis 459mml Dexamethasone;Moist Heat;Gait training;Neuromuscular re-education;Balance training;Therapeutic exercise;Therapeutic activities;Functional mobility training;Stair training;Patient/family education;Manual techniques    PT Next Visit Plan  assess ITB work and write MD note       Patient will benefit from  skilled therapeutic intervention in order to improve the following deficits and impairments:  Abnormal gait, Decreased range of motion, Difficulty walking, Decreased endurance, Decreased activity tolerance, Pain, Impaired  flexibility, Decreased strength, Decreased mobility  Visit Diagnosis: Acute pain of right knee  Difficulty in walking, not elsewhere classified  Acute pain of left knee     Problem List Patient Active Problem List   Diagnosis Date Noted  . History of CVA in adulthood 08/13/2017  . Blurry vision 08/13/2017  . Hypertension 01/02/2017  . S/P minimally invasive aortic valve replacement with bioprosthetic valve 08/23/2016  . Chronic diastolic congestive heart failure ([place])   . Osteopenia 05/27/2016  . OSA (obstructive sleep apnea) 02/27/2016  . Bruxism 10/23/2015  . Elevated alkaline phosphatase level 05/16/2015  . Atypical chest pain 11/10/2014  . Acne 05/12/2014  . Diabetes mellitus type 2, controlled ([place]) 05/12/2014  . GERD (gastroesophageal reflux disease) 03/10/2014  . Generalized anxiety disorder 03/10/2014  . Migraines 03/10/2014  . EUSTACHIAN TUBE DYSFUNCTION, CHRONIC 06/14/2010  . Irritable bowel syndrome 12/15/2008  . OBESITY, MORBID 10/12/2008  . Low back pain 08/11/2008  . Hyperlipidemia 02/09/2008  . Allergic rhinitis 02/12/2007  . ASTHMA 02/12/2007    Makilah Dowda,ANGIE PTA 12/04/2017, 4:14 PM  [place], [place], [address] Phone: [phone]   Fax:  [phone]  Name: NARIA ABBEY MRN: [id number] Date of Birth: 1951/03/16

## 2017-12-05 ENCOUNTER — Other Ambulatory Visit: Payer: Self-pay | Admitting: Family Medicine

## 2017-12-09 ENCOUNTER — Ambulatory Visit: Payer: Medicare Other | Admitting: Neurology

## 2017-12-09 ENCOUNTER — Encounter: Payer: Self-pay | Admitting: Physical Therapy

## 2017-12-09 ENCOUNTER — Ambulatory Visit: Payer: Medicare Other | Admitting: Physical Therapy

## 2017-12-09 DIAGNOSIS — R262 Difficulty in walking, not elsewhere classified: Secondary | ICD-10-CM

## 2017-12-09 DIAGNOSIS — M25562 Pain in left knee: Secondary | ICD-10-CM

## 2017-12-09 DIAGNOSIS — M25561 Pain in right knee: Secondary | ICD-10-CM | POA: Diagnosis not present

## 2017-12-09 NOTE — Therapy (Signed)
[place], [place], [address] Phone: [phone]   Fax:  [phone]  Physical Therapy Treatment Progress Note Reporting Period  11/05/17 to 12/09/17 for the first 10 visits   See note below for Objective Data and Assessment of Progress/Goals.      Patient Details  Name: [name] MRN: [id number] Date of Birth: 04-26-1951 Referring Provider: T. Percell Miller   Encounter Date: 12/09/2017  PT End of Session - 12/09/17 1516    Visit Number  10    Date for PT Re-Evaluation  01/05/18    PT Start Time  1430    PT Stop Time  1529    PT Time Calculation (min)  59 min    Activity Tolerance  Patient tolerated treatment well    Behavior During Therapy  [facility] for tasks assessed/performed       Past Medical History:  Diagnosis Date  . Allergy   . Anxiety   . Arthritis    back & knee  . Asthma     mild per pt shows up with resp illness  . Chronic diastolic congestive heart failure ([place])   . Colon polyps   . Diabetes mellitus without complication ([place])   . Gastric polyps   . Gastroparesis   . GERD (gastroesophageal reflux disease)   . Headache    sinus headaches and migraines at times  . Heart murmur   . History of migraine headaches   . Hyperlipidemia   . IBS (irritable bowel syndrome)   . Lumbar disc disease   . S/P aortic valve replacement with bioprosthetic valve 08/23/2016   25 mm Edwards Intuity rapid-deployment bovine pericardial tissue valve via partial upper mini sternotomy  . Sleep apnea     Past Surgical History:  Procedure Laterality Date  . ABDOMINAL HYSTERECTOMY    . AORTIC VALVE REPLACEMENT N/A 08/23/2016   Procedure: AORTIC VALVE REPLACEMENT (AVR) - using partial Upper Sternotomy- 24m Edwards Intuity Aortic Valve used;  Surgeon: CRexene Alberts MD;  [place]  Service: Open Heart Surgery;  Laterality: N/A;  . BLADDER SUSPENSION  2007  . BREAST ENHANCEMENT SURGERY   1980  . CARPAL TUNNEL RELEASE Bilateral   . COLONOSCOPY    . DORSAL COMPARTMENT RELEASE Right 09/15/2014   Procedure: RIGHT WRIST DEQUERVAINS RELEASE ;  Surgeon: DKathryne Hitch MD;  [place] [facility]  Service: Orthopedics;  Laterality: Right;  . ESOPHAGOGASTRODUODENOSCOPY    . KNEE ARTHROSCOPY  04/15/2012   Procedure: ARTHROSCOPY KNEE;  Surgeon: DNinetta Lights MD;  [place] [facility]  Service: Orthopedics;  Laterality: Right;  . LAPAROSCOPIC CHOLECYSTECTOMY    . LASIK    . OOPHORECTOMY    . PALATE TO GINGIVA GRAFT  2017  . RIGHT/LEFT HEART CATH AND CORONARY ANGIOGRAPHY N/A 08/02/2016   Procedure: Right/Left Heart Cath and Coronary Angiography;  Surgeon: MSherren Mocha MD;  [facility];  Service: Cardiovascular;  Laterality: N/A;  . ROTATOR CUFF REPAIR Left   . TEE WITHOUT CARDIOVERSION N/A 08/23/2016   Procedure: TRANSESOPHAGEAL ECHOCARDIOGRAM (TEE);  Surgeon: CRexene Alberts MD;  [place]  Service: Open Heart Surgery;  Laterality: N/A;  . TOTAL KNEE ARTHROPLASTY  04/15/2012   Procedure: TOTAL KNEE ARTHROPLASTY;  Surgeon: DNinetta Lights MD;  [place]: [facility]  Service: Orthopedics;  Laterality: Right;  . TRIGGER FINGER RELEASE Right 04/20/2015   Procedure: RIGHT TRIGGER FINGER RELEASE (TENDON SHEALTH INCISION) ;  Surgeon: [name], MD;  Location: [place];  Service: Orthopedics;  Laterality: Right;    There were no vitals filed for this visit.  Subjective Assessment - 12/09/17 1431    Subjective  "Im still battling this IT band"    Currently in Pain?  Yes    Pain Score  5     Pain Location  Leg    Pain Orientation  Right;Lateral                       [facility] Adult PT Treatment/Exercise - 12/09/17 0001      Knee/Hip Exercises: Aerobic   Nustep  Level 5 x 82mnutes      Knee/Hip Exercises: Standing   Lateral Step Up  Both;15 reps;Hand Hold: 2;Step Height: 6"    Walking with Sports Cord  resisted gait  all directions    Other Standing Knee Exercises  hip ext & abd 3lb x10; Standing march 3llb 2x10       Knee/Hip Exercises: Seated   Sit to Sand  10 reps;without UE support holding yellow bal       Cryotherapy   Number Minutes Cryotherapy  15 Minutes    Cryotherapy Location  Knee      Electrical Stimulation   Electrical Stimulation Location  L ITB    Electrical Stimulation Action  IFC    Electrical Stimulation Goals  Pain      Manual Therapy   Manual Therapy  Soft tissue mobilization    Manual therapy comments  Left ITB very tender with multi trigger points    Soft tissue mobilization  ITB rolling               PT Short Term Goals - 11/20/17 1530      PT SHORT TERM GOAL #1   Title  independent with intial HEP    Status  Achieved        PT Long Term Goals - 12/04/17 1613      PT LONG TERM GOAL #1   Title  decrease pain 50%    Status  Partially Met      PT LONG TERM GOAL #2   Title  returns safely to the gym    Baseline  discussed safe gym return and ex plus addition of abd/add machine    Status  Partially Met      PT LONG TERM GOAL #3   Title  go up and down steps without difficulty    Status  Partially Met      PT LONG TERM GOAL #4   Title  report no difficukty getting up from sitting    Status  Achieved            Plan - 12/09/17 1520    Clinical Impression Statement  Pt continues to fatigue with exercises. Burning in the hips reported with resisted side steps and standing hi abduction. ITB on L ITB still tender.     Rehab Potential  Good    PT Frequency  2x / week    PT Duration  8 weeks    PT Treatment/Interventions  ADLs/Self Care Home Management;Cryotherapy;Electrical Stimulation;Iontophoresis 435mml Dexamethasone;Moist Heat;Gait training;Neuromuscular re-education;Balance training;Therapeutic exercise;Therapeutic activities;Functional mobility training;Stair training;Patient/family education;Manual techniques    PT Next Visit Plan  Hip  strengthening and ITB stretching       Patient will benefit from skilled therapeutic intervention in order to improve the following deficits and impairments:  Abnormal gait,  Decreased range of motion, Difficulty walking, Decreased endurance, Decreased activity tolerance, Pain, Impaired flexibility, Decreased strength, Decreased mobility  Visit Diagnosis: Acute pain of right knee  Difficulty in walking, not elsewhere classified  Acute pain of left knee     Problem List Patient Active Problem List   Diagnosis Date Noted  . History of CVA in adulthood 08/13/2017  . Blurry vision 08/13/2017  . Hypertension 01/02/2017  . S/P minimally invasive aortic valve replacement with bioprosthetic valve 08/23/2016  . Chronic diastolic congestive heart failure ([place])   . Osteopenia 05/27/2016  . OSA (obstructive sleep apnea) 02/27/2016  . Bruxism 10/23/2015  . Elevated alkaline phosphatase level 05/16/2015  . Atypical chest pain 11/10/2014  . Acne 05/12/2014  . Diabetes mellitus type 2, controlled ([place]) 05/12/2014  . GERD (gastroesophageal reflux disease) 03/10/2014  . Generalized anxiety disorder 03/10/2014  . Migraines 03/10/2014  . EUSTACHIAN TUBE DYSFUNCTION, CHRONIC 06/14/2010  . Irritable bowel syndrome 12/15/2008  . OBESITY, MORBID 10/12/2008  . Low back pain 08/11/2008  . Hyperlipidemia 02/09/2008  . Allergic rhinitis 02/12/2007  . ASTHMA 02/12/2007    Scot Jun, PTA 12/09/2017, 3:22 PM  [place], [place], [address] Phone: [phone]   Fax:  [phone]  Name: [name] MRN: [id number] Date of Birth: 1950-08-04

## 2017-12-11 ENCOUNTER — Encounter: Payer: Self-pay | Admitting: Physical Therapy

## 2017-12-11 ENCOUNTER — Ambulatory Visit: Payer: Medicare Other | Admitting: Physical Therapy

## 2017-12-11 DIAGNOSIS — M25561 Pain in right knee: Secondary | ICD-10-CM

## 2017-12-11 DIAGNOSIS — M25562 Pain in left knee: Secondary | ICD-10-CM

## 2017-12-11 DIAGNOSIS — R262 Difficulty in walking, not elsewhere classified: Secondary | ICD-10-CM

## 2017-12-11 NOTE — Therapy (Signed)
[place], [place], [address] Phone: [phone]   Fax:  [phone]  Physical Therapy Treatment  Patient Details  Name: [name] MRN: [id number] Date of Birth: 1951-04-28 Referring Provider: T. Colin Broach Date: 12/11/2017  PT End of Session - 12/11/17 1553    Visit Number  11    Date for PT Re-Evaluation  01/05/18    PT Start Time  1520    PT Stop Time  1620    PT Time Calculation (min)  60 min       Past Medical History:  Diagnosis Date  . Allergy   . Anxiety   . Arthritis    back & knee  . Asthma     mild per pt shows up with resp illness  . Chronic diastolic congestive heart failure ([place])   . Colon polyps   . Diabetes mellitus without complication ([place])   . Gastric polyps   . Gastroparesis   . GERD (gastroesophageal reflux disease)   . Headache    sinus headaches and migraines at times  . Heart murmur   . History of migraine headaches   . Hyperlipidemia   . IBS (irritable bowel syndrome)   . Lumbar disc disease   . S/P aortic valve replacement with bioprosthetic valve 08/23/2016   25 mm Edwards Intuity rapid-deployment bovine pericardial tissue valve via partial upper mini sternotomy  . Sleep apnea     Past Surgical History:  Procedure Laterality Date  . ABDOMINAL HYSTERECTOMY    . AORTIC VALVE REPLACEMENT N/A 08/23/2016   Procedure: AORTIC VALVE REPLACEMENT (AVR) - using partial Upper Sternotomy- 40m Edwards Intuity Aortic Valve used;  Surgeon: CRexene Alberts MD;  [place]  Service: Open Heart Surgery;  Laterality: N/A;  . BLADDER SUSPENSION  2007  . BREAST ENHANCEMENT SURGERY  1980  . CARPAL TUNNEL RELEASE Bilateral   . COLONOSCOPY    . DORSAL COMPARTMENT RELEASE Right 09/15/2014   Procedure: RIGHT WRIST DEQUERVAINS RELEASE ;  Surgeon: DKathryne Hitch MD;  Location: [facility]  Service: Orthopedics;  Laterality: Right;  .  ESOPHAGOGASTRODUODENOSCOPY    . KNEE ARTHROSCOPY  04/15/2012   Procedure: ARTHROSCOPY KNEE;  Surgeon: DNinetta Lights MD;  Location: [facility]  Service: Orthopedics;  Laterality: Right;  . LAPAROSCOPIC CHOLECYSTECTOMY    . LASIK    . OOPHORECTOMY    . PALATE TO GINGIVA GRAFT  2017  . RIGHT/LEFT HEART CATH AND CORONARY ANGIOGRAPHY N/A 08/02/2016   Procedure: Right/Left Heart Cath and Coronary Angiography;  Surgeon: MSherren Mocha MD;  [place];  Service: Cardiovascular;  Laterality: N/A;  . ROTATOR CUFF REPAIR Left   . TEE WITHOUT CARDIOVERSION N/A 08/23/2016   Procedure: TRANSESOPHAGEAL ECHOCARDIOGRAM (TEE);  Surgeon: CRexene Alberts MD;  [place]: [facility]  Service: Open Heart Surgery;  Laterality: N/A;  . TOTAL KNEE ARTHROPLASTY  04/15/2012   Procedure: TOTAL KNEE ARTHROPLASTY;  Surgeon: DNinetta Lights MD;  [place] [facility]  Service: Orthopedics;  Laterality: Right;  . TRIGGER FINGER RELEASE Right 04/20/2015   Procedure: RIGHT TRIGGER FINGER RELEASE (TENDON SHEALTH INCISION) ;  Surgeon: DNinetta Lights MD;  [place] [facility]  Service: Orthopedics;  Laterality: Right;    There were no vitals filed for this visit.  Subjective Assessment - 12/11/17 1523    Subjective  saw MD and he gave me a script to add ITB  tx. rolling didn't hurt as bad last session    Currently in Pain?  Yes    Pain Score  4     Pain Location  Leg    Pain Orientation  Right;Lateral                       [facility] Adult PT Treatment/Exercise - 12/11/17 0001      Knee/Hip Exercises: Aerobic   Nustep  Level 5 x 22mnutes      Knee/Hip Exercises: Standing   Lateral Step Up  Both;15 reps;Hand Hold: 2;Step Height: 6" opp leg abd    Forward Step Up  Both;15 reps;Hand Hold: 2;Step Height: 6" opp leg ext      Knee/Hip Exercises: Supine   Bridges with Ball Squeeze  2 sets;10 reps    Bridges with Clamshell  Strengthening;2 sets;10 reps green tband    Straight Leg Raises   Both;Strengthening;15 reps with abd red tband    Other Supine Knee/Hip Exercises  bridge and obl ith feet on ball 15 each    Other Supine Knee/Hip Exercises  green tband marching      Cryotherapy   Number Minutes Cryotherapy  15 Minutes    Cryotherapy Location  Knee    Type of Cryotherapy  Ice pack      Electrical Stimulation   Electrical Stimulation Location  L ITB    Electrical Stimulation Action  IFC    Electrical Stimulation Goals  Pain      Manual Therapy   Manual Therapy  Soft tissue mobilization;Passive ROM    Manual therapy comments  decreased trigger pts    Soft tissue mobilization  ITB rolling    Passive ROM  LE               PT Short Term Goals - 11/20/17 1530      PT SHORT TERM GOAL #1   Title  independent with intial HEP    Status  Achieved        PT Long Term Goals - 12/11/17 1553      PT LONG TERM GOAL #1   Title  decrease pain 50%    Status  Partially Met      PT LONG TERM GOAL #2   Title  returns safely to the gym    Status  Partially Met      PT LONG TERM GOAL #3   Title  go up and down steps without difficulty    Status  Partially Met      PT LONG TERM GOAL #4   Title  report no difficukty getting up from sitting    Status  Achieved            Plan - 12/11/17 1554    Clinical Impression Statement  focus on hip strengthen today,some pain and weakness/fatigue. progressing towards goals. ITB with significant decrease in tightness and knots- still complains about steps and lying on left side    PT Treatment/Interventions  ADLs/Self Care Home Management;Cryotherapy;Electrical Stimulation;Iontophoresis 4107mml Dexamethasone;Moist Heat;Gait training;Neuromuscular re-education;Balance training;Therapeutic exercise;Therapeutic activities;Functional mobility training;Stair training;Patient/family education;Manual techniques    PT Next Visit Plan  Hip strengthening and ITB stretching       Patient will benefit from skilled therapeutic  intervention in order to improve the following deficits and impairments:  Abnormal gait, Decreased range of motion, Difficulty walking, Decreased endurance, Decreased activity tolerance, Pain, Impaired flexibility, Decreased strength, Decreased mobility  Visit Diagnosis: Acute pain of right knee  Difficulty in walking, not elsewhere classified  Acute pain of left knee     Problem List Patient Active Problem List   Diagnosis Date Noted  . History of CVA in adulthood 08/13/2017  . Blurry vision 08/13/2017  . Hypertension 01/02/2017  . S/P minimally invasive aortic valve replacement with bioprosthetic valve 08/23/2016  . Chronic diastolic congestive heart failure ([place])   . Osteopenia 05/27/2016  . OSA (obstructive sleep apnea) 02/27/2016  . Bruxism 10/23/2015  . Elevated alkaline phosphatase level 05/16/2015  . Atypical chest pain 11/10/2014  . Acne 05/12/2014  . Diabetes mellitus type 2, controlled ([place]) 05/12/2014  . GERD (gastroesophageal reflux disease) 03/10/2014  . Generalized anxiety disorder 03/10/2014  . Migraines 03/10/2014  . EUSTACHIAN TUBE DYSFUNCTION, CHRONIC 06/14/2010  . Irritable bowel syndrome 12/15/2008  . OBESITY, MORBID 10/12/2008  . Low back pain 08/11/2008  . Hyperlipidemia 02/09/2008  . Allergic rhinitis 02/12/2007  . ASTHMA 02/12/2007    Ahmar Pickrell,ANGIE PTA 12/11/2017, 3:57 PM  [place], [place], [address] Phone: [phone]   Fax:  [phone]  Name: [name] MRN: [id number] Date of Birth: June 13, 1951

## 2017-12-16 ENCOUNTER — Ambulatory Visit: Payer: Medicare Other | Admitting: Physical Therapy

## 2017-12-16 ENCOUNTER — Encounter: Payer: Self-pay | Admitting: Physical Therapy

## 2017-12-16 DIAGNOSIS — M25562 Pain in left knee: Secondary | ICD-10-CM

## 2017-12-16 DIAGNOSIS — R262 Difficulty in walking, not elsewhere classified: Secondary | ICD-10-CM

## 2017-12-16 DIAGNOSIS — M25561 Pain in right knee: Secondary | ICD-10-CM | POA: Diagnosis not present

## 2017-12-16 NOTE — Therapy (Signed)
[place], [place], [address] Phone: [phone]   Fax:  [phone]  Physical Therapy Treatment  Patient Details  Name: [name] MRN: [id number] Date of Birth: [birth year] Referring Provider: T. Percell Miller   Encounter Date: 12/16/2017  PT End of Session - 12/16/17 1428    Visit Number  12    Date for PT Re-Evaluation  01/05/18    PT Start Time  1345    PT Stop Time  1443    PT Time Calculation (min)  58 min    Activity Tolerance  Patient tolerated treatment well    Behavior During Therapy  [facility] for tasks assessed/performed       Past Medical History:  Diagnosis Date  . Allergy   . Anxiety   . Arthritis    back & knee  . Asthma     mild per pt shows up with resp illness  . Chronic diastolic congestive heart failure ([place])   . Colon polyps   . Diabetes mellitus without complication ([place])   . Gastric polyps   . Gastroparesis   . GERD (gastroesophageal reflux disease)   . Headache    sinus headaches and migraines at times  . Heart murmur   . History of migraine headaches   . Hyperlipidemia   . IBS (irritable bowel syndrome)   . Lumbar disc disease   . S/P aortic valve replacement with bioprosthetic valve 08/23/2016   25 mm Edwards Intuity rapid-deployment bovine pericardial tissue valve via partial upper mini sternotomy  . Sleep apnea     Past Surgical History:  Procedure Laterality Date  . ABDOMINAL HYSTERECTOMY    . AORTIC VALVE REPLACEMENT N/A 08/23/2016   Procedure: AORTIC VALVE REPLACEMENT (AVR) - using partial Upper Sternotomy- 46m Edwards Intuity Aortic Valve used;  Surgeon: CRexene Alberts MD;  Location: [place]  Service: Open Heart Surgery;  Laterality: N/A;  . BLADDER SUSPENSION  2007  . BREAST ENHANCEMENT SURGERY  1980  . CARPAL TUNNEL RELEASE Bilateral   . COLONOSCOPY    . DORSAL COMPARTMENT RELEASE Right 09/15/2014   Procedure: RIGHT WRIST DEQUERVAINS RELEASE ;   Surgeon: DKathryne Hitch MD;  Location: [facility]  Service: Orthopedics;  Laterality: Right;  . ESOPHAGOGASTRODUODENOSCOPY    . KNEE ARTHROSCOPY  04/15/2012   Procedure: ARTHROSCOPY KNEE;  Surgeon: DNinetta Lights MD;  [place]  Service: Orthopedics;  Laterality: Right;  . LAPAROSCOPIC CHOLECYSTECTOMY    . LASIK    . OOPHORECTOMY    . PALATE TO GINGIVA GRAFT  2017  . RIGHT/LEFT HEART CATH AND CORONARY ANGIOGRAPHY N/A 08/02/2016   Procedure: Right/Left Heart Cath and Coronary Angiography;  Surgeon: MSherren Mocha MD;  [facility];  Service: Cardiovascular;  Laterality: N/A;  . ROTATOR CUFF REPAIR Left   . TEE WITHOUT CARDIOVERSION N/A 08/23/2016   Procedure: TRANSESOPHAGEAL ECHOCARDIOGRAM (TEE);  Surgeon: CRexene Alberts MD;  [place] [facility]  Service: Open Heart Surgery;  Laterality: N/A;  . TOTAL KNEE ARTHROPLASTY  04/15/2012   Procedure: TOTAL KNEE ARTHROPLASTY;  Surgeon: DNinetta Lights MD;  Location: [facility]  Service: Orthopedics;  Laterality: Right;  . TRIGGER FINGER RELEASE Right 04/20/2015   Procedure: RIGHT TRIGGER FINGER RELEASE (TENDON SHEALTH INCISION) ;  Surgeon: DNinetta Lights MD;  [place] [facility]  Service: Orthopedics;  Laterality: Right;    There were no vitals filed for this visit.  Subjective Assessment - 12/16/17 1346    Subjective  "Pretty good"    Currently in Pain?  Yes    Pain Score  5     Pain Location  Leg    Pain Orientation  Left;Lateral                       [facility] Adult PT Treatment/Exercise - 12/16/17 0001      Knee/Hip Exercises: Aerobic   Nustep  Level 5 x 16mnutes      Knee/Hip Exercises: Standing   Lateral Step Up  Both;15 reps;Hand Hold: 2;Step Height: 6" opposite leg abd     Forward Step Up  Both;15 reps;Hand Hold: 2;Step Height: 6" opposite leg ext       Knee/Hip Exercises: Supine   Bridges with Ball Squeeze  2 sets;10 reps    Straight Leg Raises  Both;2 sets;15  reps 2lb    Other Supine Knee/Hip Exercises  bridge and obl ith feet on ball 15 each, K2C      Cryotherapy   Number Minutes Cryotherapy  15 Minutes    Cryotherapy Location  Knee    Type of Cryotherapy  Ice pack      Electrical Stimulation   Electrical Stimulation Location  L ITB    Electrical Stimulation Action  IFC    Electrical Stimulation Goals  Pain      Manual Therapy   Manual Therapy  Soft tissue mobilization;Passive ROM    Manual therapy comments  decreased trigger pts    Soft tissue mobilization  ITB rolling    Passive ROM  LE               PT Short Term Goals - 11/20/17 1530      PT SHORT TERM GOAL #1   Title  independent with intial HEP    Status  Achieved        PT Long Term Goals - 12/11/17 1553      PT LONG TERM GOAL #1   Title  decrease pain 50%    Status  Partially Met      PT LONG TERM GOAL #2   Title  returns safely to the gym    Status  Partially Met      PT LONG TERM GOAL #3   Title  go up and down steps without difficulty    Status  Partially Met      PT LONG TERM GOAL #4   Title  report no difficukty getting up from sitting    Status  Achieved            Plan - 12/16/17 1428    Clinical Impression Statement  Continues with hip and core strengthening. LLE become really fatigue with SLR using 2lb resistance. Fatigues quick with more functional activities. progressing towards goals.    Rehab Potential  Good    PT Frequency  2x / week    PT Duration  8 weeks    PT Treatment/Interventions  ADLs/Self Care Home Management;Cryotherapy;Electrical Stimulation;Iontophoresis 458mml Dexamethasone;Moist Heat;Gait training;Neuromuscular re-education;Balance training;Therapeutic exercise;Therapeutic activities;Functional mobility training;Stair training;Patient/family education;Manual techniques    PT Next Visit Plan  Hip strengthening and ITB stretching       Patient will benefit from skilled therapeutic intervention in order to improve  the following deficits and impairments:  Abnormal gait, Decreased range of motion, Difficulty walking, Decreased endurance, Decreased activity tolerance, Pain, Impaired flexibility, Decreased strength, Decreased mobility  Visit Diagnosis: Acute pain of  right knee  Difficulty in walking, not elsewhere classified  Acute pain of left knee     Problem List Patient Active Problem List   Diagnosis Date Noted  . History of CVA in adulthood 08/13/2017  . Blurry vision 08/13/2017  . Hypertension 01/02/2017  . S/P minimally invasive aortic valve replacement with bioprosthetic valve 08/23/2016  . Chronic diastolic congestive heart failure ([place])   . Osteopenia 05/27/2016  . OSA (obstructive sleep apnea) 02/27/2016  . Bruxism 10/23/2015  . Elevated alkaline phosphatase level 05/16/2015  . Atypical chest pain 11/10/2014  . Acne 05/12/2014  . Diabetes mellitus type 2, controlled ([place]) 05/12/2014  . GERD (gastroesophageal reflux disease) 03/10/2014  . Generalized anxiety disorder 03/10/2014  . Migraines 03/10/2014  . EUSTACHIAN TUBE DYSFUNCTION, CHRONIC 06/14/2010  . Irritable bowel syndrome 12/15/2008  . OBESITY, MORBID 10/12/2008  . Low back pain 08/11/2008  . Hyperlipidemia 02/09/2008  . Allergic rhinitis 02/12/2007  . ASTHMA 02/12/2007    Scot Jun, PTA 12/16/2017, 2:30 PM  [place], [place], [address] Phone: [phone]   Fax:  [phone]  Name: [name] MRN: [id number] Date of Birth: 07-27-1950

## 2017-12-18 ENCOUNTER — Ambulatory Visit: Payer: Medicare Other | Admitting: Physical Therapy

## 2017-12-18 ENCOUNTER — Ambulatory Visit: Payer: Medicare Other | Admitting: *Deleted

## 2017-12-18 ENCOUNTER — Encounter: Payer: Self-pay | Admitting: Physical Therapy

## 2017-12-18 DIAGNOSIS — M25562 Pain in left knee: Secondary | ICD-10-CM

## 2017-12-18 DIAGNOSIS — M25561 Pain in right knee: Secondary | ICD-10-CM

## 2017-12-18 DIAGNOSIS — R262 Difficulty in walking, not elsewhere classified: Secondary | ICD-10-CM

## 2017-12-18 NOTE — Therapy (Signed)
[place], [place], [address] Phone: [phone]   Fax:  [phone]  Physical Therapy Treatment  Patient Details  Name: [name] MRN: [id number] Date of Birth: 1951-03-14 Referring Provider: T. Percell Miller   Encounter Date: 12/18/2017  PT End of Session - 12/18/17 1516    Visit Number  13    Date for PT Re-Evaluation  01/05/18    PT Start Time  1440    PT Stop Time  1540    PT Time Calculation (min)  60 min       Past Medical History:  Diagnosis Date  . Allergy   . Anxiety   . Arthritis    back & knee  . Asthma     mild per pt shows up with resp illness  . Chronic diastolic congestive heart failure ([place])   . Colon polyps   . Diabetes mellitus without complication ([place])   . Gastric polyps   . Gastroparesis   . GERD (gastroesophageal reflux disease)   . Headache    sinus headaches and migraines at times  . Heart murmur   . History of migraine headaches   . Hyperlipidemia   . IBS (irritable bowel syndrome)   . Lumbar disc disease   . S/P aortic valve replacement with bioprosthetic valve 08/23/2016   25 mm Edwards Intuity rapid-deployment bovine pericardial tissue valve via partial upper mini sternotomy  . Sleep apnea     Past Surgical History:  Procedure Laterality Date  . ABDOMINAL HYSTERECTOMY    . AORTIC VALVE REPLACEMENT N/A 08/23/2016   Procedure: AORTIC VALVE REPLACEMENT (AVR) - using partial Upper Sternotomy- 46m Edwards Intuity Aortic Valve used;  Surgeon: CRexene Alberts MD;  [facility]  Service: Open Heart Surgery;  Laterality: N/A;  . BLADDER SUSPENSION  2007  . BREAST ENHANCEMENT SURGERY  1980  . CARPAL TUNNEL RELEASE Bilateral   . COLONOSCOPY    . DORSAL COMPARTMENT RELEASE Right 09/15/2014   Procedure: RIGHT WRIST DEQUERVAINS RELEASE ;  Surgeon: DKathryne Hitch MD;  [facility]  Service: Orthopedics;  Laterality: Right;  .  ESOPHAGOGASTRODUODENOSCOPY    . KNEE ARTHROSCOPY  04/15/2012   Procedure: ARTHROSCOPY KNEE;  Surgeon: DNinetta Lights MD;  [facility] [facility]  Service: Orthopedics;  Laterality: Right;  . LAPAROSCOPIC CHOLECYSTECTOMY    . LASIK    . OOPHORECTOMY    . PALATE TO GINGIVA GRAFT  2017  . RIGHT/LEFT HEART CATH AND CORONARY ANGIOGRAPHY N/A 08/02/2016   Procedure: Right/Left Heart Cath and Coronary Angiography;  Surgeon: MSherren Mocha MD;  [facility];  Service: Cardiovascular;  Laterality: N/A;  . ROTATOR CUFF REPAIR Left   . TEE WITHOUT CARDIOVERSION N/A 08/23/2016   Procedure: TRANSESOPHAGEAL ECHOCARDIOGRAM (TEE);  Surgeon: CRexene Alberts MD;  [facility] [facility]  Service: Open Heart Surgery;  Laterality: N/A;  . TOTAL KNEE ARTHROPLASTY  04/15/2012   Procedure: TOTAL KNEE ARTHROPLASTY;  Surgeon: DNinetta Lights MD;  [facility]  Service: Orthopedics;  Laterality: Right;  . TRIGGER FINGER RELEASE Right 04/20/2015   Procedure: RIGHT TRIGGER FINGER RELEASE (TENDON SHEALTH INCISION) ;  Surgeon: DNinetta Lights MD;  [facility]  Service: Orthopedics;  Laterality: Right;    There were no vitals filed for this visit.  Subjective Assessment - 12/18/17 1443    Subjective  sore from SLR but overall seeing some improvements knees 50% better,ITB  15% better. steps are still painful    Currently in Pain?  Yes    Pain Score  5     Pain Location  Leg    Pain Orientation  Left;Lateral                       [facility] Adult PT Treatment/Exercise - 12/18/17 0001      Knee/Hip Exercises: Aerobic   Nustep  Level 5 x 23mnutes LE only      Knee/Hip Exercises: Machines for Strengthening   Cybex Knee Extension  10# 3x10    Cybex Knee Flexion  25# 3x10    Cybex Leg Press  30# 3x15 feet 3 way      Knee/Hip Exercises: Supine   Bridges with Ball Squeeze  2 sets;10 reps    Straight Leg Raises  Strengthening;Both;1 set;10 reps red tband then another set  adding in abd    Other Supine Knee/Hip Exercises  green tband marching and clamshell      Knee/Hip Exercises: Sidelying   Hip ABduction  Strengthening;Left;15 reps red tband    Other Sidelying Knee/Hip Exercises  red tband hip circles      Cryotherapy   Number Minutes Cryotherapy  15 Minutes    Cryotherapy Location  Knee    Type of Cryotherapy  Ice pack      Electrical Stimulation   Electrical Stimulation Location  L ITB    Electrical Stimulation Action  IFC    Electrical Stimulation Parameters  supine    Electrical Stimulation Goals  Pain      Manual Therapy   Manual Therapy  Soft tissue mobilization;Passive ROM    Soft tissue mobilization  ITB    Passive ROM  LE                PT Short Term Goals - 11/20/17 1530      PT SHORT TERM GOAL #1   Title  independent with intial HEP    Status  Achieved        PT Long Term Goals - 12/18/17 1446      PT LONG TERM GOAL #1   Title  decrease pain 50%    Status  Partially Met      PT LONG TERM GOAL #2   Title  returns safely to the gym    Status  Partially Met      PT LONG TERM GOAL #3   Title  go up and down steps without difficulty    Status  Partially Met      PT LONG TERM GOAL #4   Title  report no difficukty getting up from sitting    Status  Achieved            Plan - 12/18/17 1516    Clinical Impression Statement  focus on isolated hip strengthening which caused some pain and weakness- cuing for compensation. progressing with goals    PT Treatment/Interventions  ADLs/Self Care Home Management;Cryotherapy;Electrical Stimulation;Iontophoresis 428mml Dexamethasone;Moist Heat;Gait training;Neuromuscular re-education;Balance training;Therapeutic exercise;Therapeutic activities;Functional mobility training;Stair training;Patient/family education;Manual techniques    PT Next Visit Plan  Hip strengthening and ITB stretching       Patient will benefit from skilled therapeutic intervention in order to  improve the following deficits and impairments:  Abnormal gait, Decreased range of motion, Difficulty walking, Decreased endurance, Decreased activity tolerance, Pain, Impaired flexibility, Decreased strength, Decreased mobility  Visit Diagnosis: Acute pain of right knee  Difficulty in walking, not elsewhere  classified  Acute pain of left knee     Problem List Patient Active Problem List   Diagnosis Date Noted  . History of CVA in adulthood 08/13/2017  . Blurry vision 08/13/2017  . Hypertension 01/02/2017  . S/P minimally invasive aortic valve replacement with bioprosthetic valve 08/23/2016  . Chronic diastolic congestive heart failure ([place])   . Osteopenia 05/27/2016  . OSA (obstructive sleep apnea) 02/27/2016  . Bruxism 10/23/2015  . Elevated alkaline phosphatase level 05/16/2015  . Atypical chest pain 11/10/2014  . Acne 05/12/2014  . Diabetes mellitus type 2, controlled ([place]) 05/12/2014  . GERD (gastroesophageal reflux disease) 03/10/2014  . Generalized anxiety disorder 03/10/2014  . Migraines 03/10/2014  . EUSTACHIAN TUBE DYSFUNCTION, CHRONIC 06/14/2010  . Irritable bowel syndrome 12/15/2008  . OBESITY, MORBID 10/12/2008  . Low back pain 08/11/2008  . Hyperlipidemia 02/09/2008  . Allergic rhinitis 02/12/2007  . ASTHMA 02/12/2007    Gael Delude,ANGIE PTA 12/18/2017, 3:17 PM  [place], [place], [address] Phone: [phone]   Fax:  [phone]  Name: [name] MRN: [id number] Date of Birth: 02-13-1951

## 2017-12-22 ENCOUNTER — Encounter: Payer: Self-pay | Admitting: Physical Therapy

## 2017-12-22 ENCOUNTER — Ambulatory Visit: Payer: Medicare Other | Attending: Orthopedic Surgery | Admitting: Physical Therapy

## 2017-12-22 DIAGNOSIS — M25562 Pain in left knee: Secondary | ICD-10-CM | POA: Diagnosis present

## 2017-12-22 DIAGNOSIS — R262 Difficulty in walking, not elsewhere classified: Secondary | ICD-10-CM

## 2017-12-22 DIAGNOSIS — M25561 Pain in right knee: Secondary | ICD-10-CM | POA: Diagnosis not present

## 2017-12-22 NOTE — Therapy (Signed)
[place], [place], [address] Phone: [phone]   Fax:  [phone]  Physical Therapy Treatment  Patient Details  Name: [name] MRN: [id number] Date of Birth: March 16, 1951 Referring Provider: T. Percell Miller   Encounter Date: 12/22/2017  PT End of Session - 12/22/17 1425    Visit Number  14    PT Start Time  0712    PT Stop Time  1443    PT Time Calculation (min)  58 min    Activity Tolerance  Patient tolerated treatment well    Behavior During Therapy  WFL for tasks assessed/performed       Past Medical History:  Diagnosis Date  . Allergy   . Anxiety   . Arthritis    back & knee  . Asthma     mild per pt shows up with resp illness  . Chronic diastolic congestive heart failure ([place])   . Colon polyps   . Diabetes mellitus without complication ([place])   . Gastric polyps   . Gastroparesis   . GERD (gastroesophageal reflux disease)   . Headache    sinus headaches and migraines at times  . Heart murmur   . History of migraine headaches   . Hyperlipidemia   . IBS (irritable bowel syndrome)   . Lumbar disc disease   . S/P aortic valve replacement with bioprosthetic valve 08/23/2016   25 mm Edwards Intuity rapid-deployment bovine pericardial tissue valve via partial upper mini sternotomy  . Sleep apnea     Past Surgical History:  Procedure Laterality Date  . ABDOMINAL HYSTERECTOMY    . AORTIC VALVE REPLACEMENT N/A 08/23/2016   Procedure: AORTIC VALVE REPLACEMENT (AVR) - using partial Upper Sternotomy- 39m Edwards Intuity Aortic Valve used;  Surgeon: CRexene Alberts MD;  [facility]  Service: Open Heart Surgery;  Laterality: N/A;  . BLADDER SUSPENSION  2007  . BREAST ENHANCEMENT SURGERY  1980  . CARPAL TUNNEL RELEASE Bilateral   . COLONOSCOPY    . DORSAL COMPARTMENT RELEASE Right 09/15/2014   Procedure: RIGHT WRIST DEQUERVAINS RELEASE ;  Surgeon: DKathryne Hitch MD;  [facility]  Service: Orthopedics;  Laterality: Right;  . ESOPHAGOGASTRODUODENOSCOPY    . KNEE ARTHROSCOPY  04/15/2012   Procedure: ARTHROSCOPY KNEE;  Surgeon: DNinetta Lights MD;  [facility]  Service: Orthopedics;  Laterality: Right;  . LAPAROSCOPIC CHOLECYSTECTOMY    . LASIK    . OOPHORECTOMY    . PALATE TO GINGIVA GRAFT  2017  . RIGHT/LEFT HEART CATH AND CORONARY ANGIOGRAPHY N/A 08/02/2016   Procedure: Right/Left Heart Cath and Coronary Angiography;  Surgeon: MSherren Mocha MD;  [facility];  Service: Cardiovascular;  Laterality: N/A;  . ROTATOR CUFF REPAIR Left   . TEE WITHOUT CARDIOVERSION N/A 08/23/2016   Procedure: TRANSESOPHAGEAL ECHOCARDIOGRAM (TEE);  Surgeon: CRexene Alberts MD;  [facility]  Service: Open Heart Surgery;  Laterality: N/A;  . TOTAL KNEE ARTHROPLASTY  04/15/2012   Procedure: TOTAL KNEE ARTHROPLASTY;  Surgeon: DNinetta Lights MD;  [facility]  Service: Orthopedics;  Laterality: Right;  . TRIGGER FINGER RELEASE Right 04/20/2015   Procedure: RIGHT TRIGGER FINGER RELEASE (TENDON SHEALTH INCISION) ;  Surgeon: DNinetta Lights MD;  [facility] [facility]  Service: Orthopedics;  Laterality: Right;    There were no vitals filed for this visit.  Subjective Assessment - 12/22/17 1345    Subjective  Pt reports trying some of the stretchy stuff at home. Reports going up and down some stairs    Currently in Pain?  No/denies                       [facility] Adult PT Treatment/Exercise - 12/22/17 0001      Knee/Hip Exercises: Aerobic   Nustep  Level 5 x 22mnutes      Knee/Hip Exercises: Machines for Strengthening   Cybex Knee Extension  10# 3x10    Cybex Knee Flexion  25# 3x10    Cybex Leg Press  40# 3x15      Knee/Hip Exercises: Standing   Walking with Sports Cord  resisted side steps 40lb x5 each      Knee/Hip Exercises: Supine   Bridges with Ball Squeeze  2 sets;15 reps;Both    Other Supine  Knee/Hip Exercises  SLR and abd 1.5 2x8    Other Supine Knee/Hip Exercises  bridge with march 1.5lb 2x5       Cryotherapy   Number Minutes Cryotherapy  15 Minutes    Cryotherapy Location  Knee      Electrical Stimulation   Electrical Stimulation Location  L ITB    Electrical Stimulation Action  IFC    Electrical Stimulation Parameters  supine    Electrical Stimulation Goals  Pain      Manual Therapy   Manual Therapy  Soft tissue mobilization;Passive ROM    Soft tissue mobilization  ITB    Passive ROM  LE                PT Short Term Goals - 11/20/17 1530      PT SHORT TERM GOAL #1   Title  independent with intial HEP    Status  Achieved        PT Long Term Goals - 12/22/17 1426      PT LONG TERM GOAL #1   Title  decrease pain 50%      PT LONG TERM GOAL #2   Title  returns safely to the gym    Status  Partially Met      PT LONG TERM GOAL #3   Title  go up and down steps without difficulty    Status  Partially Met      PT LONG TERM GOAL #4   Title  report no difficukty getting up from sitting    Status  Achieved            Plan - 12/22/17 1426    Clinical Impression Statement  Pt continues to progress towards goals, weakness and fatigue reported with supine bridge and march. Good control with resisted side step. Pt tolerated increase resistance on leg press    Rehab Potential  Good    PT Frequency  2x / week    PT Duration  8 weeks    PT Next Visit Plan  Hip strengthening and ITB stretching       Patient will benefit from skilled therapeutic intervention in order to improve the following deficits and impairments:  Abnormal gait, Decreased range of motion, Difficulty walking, Decreased endurance, Decreased activity tolerance, Pain, Impaired flexibility, Decreased strength, Decreased mobility  Visit Diagnosis: Acute pain of right knee  Difficulty in walking, not elsewhere classified  Acute pain of left knee     Problem List Patient Active  Problem List   Diagnosis Date Noted  . History of CVA in adulthood 08/13/2017  . Blurry  vision 08/13/2017  . Hypertension 01/02/2017  . S/P minimally invasive aortic valve replacement with bioprosthetic valve 08/23/2016  . Chronic diastolic congestive heart failure ([place])   . Osteopenia 05/27/2016  . OSA (obstructive sleep apnea) 02/27/2016  . Bruxism 10/23/2015  . Elevated alkaline phosphatase level 05/16/2015  . Atypical chest pain 11/10/2014  . Acne 05/12/2014  . Diabetes mellitus type 2, controlled ([place]) 05/12/2014  . GERD (gastroesophageal reflux disease) 03/10/2014  . Generalized anxiety disorder 03/10/2014  . Migraines 03/10/2014  . EUSTACHIAN TUBE DYSFUNCTION, CHRONIC 06/14/2010  . Irritable bowel syndrome 12/15/2008  . OBESITY, MORBID 10/12/2008  . Low back pain 08/11/2008  . Hyperlipidemia 02/09/2008  . Allergic rhinitis 02/12/2007  . ASTHMA 02/12/2007    Scot Jun, PTA 12/22/2017, 2:28 PM  [place], [place], [address] Phone: [phone]   Fax:  [phone]  Name: DEANIA SIGUENZA MRN: [id number] Date of Birth: July 08, 1950

## 2017-12-24 ENCOUNTER — Encounter: Payer: Self-pay | Admitting: Neurology

## 2017-12-24 ENCOUNTER — Other Ambulatory Visit: Payer: Self-pay

## 2017-12-24 ENCOUNTER — Ambulatory Visit: Payer: Medicare Other | Admitting: Neurology

## 2017-12-24 VITALS — BP 132/60 | HR 64 | Ht 66.0 in | Wt 230.0 lb

## 2017-12-24 DIAGNOSIS — E119 Type 2 diabetes mellitus without complications: Secondary | ICD-10-CM

## 2017-12-24 DIAGNOSIS — I10 Essential (primary) hypertension: Secondary | ICD-10-CM

## 2017-12-24 DIAGNOSIS — G43609 Persistent migraine aura with cerebral infarction, not intractable, without status migrainosus: Secondary | ICD-10-CM | POA: Diagnosis not present

## 2017-12-24 DIAGNOSIS — E785 Hyperlipidemia, unspecified: Secondary | ICD-10-CM | POA: Diagnosis not present

## 2017-12-24 DIAGNOSIS — I639 Cerebral infarction, unspecified: Secondary | ICD-10-CM

## 2017-12-24 NOTE — Patient Instructions (Addendum)
1.  Continue venlafaxine XR 150mg  daily.  If you should have a migraine, use ibuprofen or excedrin with metochlopramide 2.  Continue Plavix for secondary stroke prevention.  Continue lipitor 3.  Follow up in 6 months

## 2017-12-24 NOTE — Progress Notes (Signed)
NEUROLOGY FOLLOW UP OFFICE NOTE  [name] [id number]  HISTORY OF PRESENT ILLNESS: [name] is a 67 year old right-handed female with type 2 diabetes, hypertension, hyperlipidemia, status post bovine aortic valve and migraine who follows up for stroke.  She is accompanied by her husband who supplements history.    UPDATE: She is on Plavix for secondary stroke prevention and atorvastatin. MRI of brain without contrast from 09/01/17 was personally reviewed and did demonstrate subacute small right inferior occipital lobe infarct as well as old small cerebellar infarcts and mild chronic small vessel ischemic changes. Direct LDL from 11/25/17 was 58.  A1c was 6.2.  For migraine prophylaxis (and anxiety), she takes venlafaxine XR 150mg  daily.  She has not had a migraine since last visit, but for abortive therapy she is instructed to take either ibuprofen or Excedrin with metochlopramide.   HISTORY: [name] has history of migraines since high school.  They are left sided frontal throbbing headaches of moderate to severe intensity.  They typically last 3 hours and occur once a week.  They are preceded by an aura of spots in the vision of both eyes, lasting 2 hours.  They are aggravated by stress and relieved by rest.  She typically takes Imitrex, which is effective.   On 08/07/17, she developed one of her habitual migraines with aura.  However, the headache was more severe and the visual aura did not resolve.  The headache was severe for about 3 days and then tapered off after another 4 days.  The visual aura lasted up to a week.  She did not have any facial droop, unilateral numbness or weakness, ataxia or vertigo.  CT of head from 08/12/17 was personally reviewed and revealed a small patchy hypodensity in the right occipital lobe concerning for subacute infarct.  MRA of head from 08/21/17 was personally reviewed and revealed flow-limiting stenosis versus artifact in the left M2  segement but otherwise intracranial arteries patent, including right PCA.  Carotid doppler from 08/19/17 revealed no hemodynamically significant bilateral ICA stenosis and both vertebral arteries were patent with antegrade flow.  Echocardiogram from yesterday showed EF 60-65% with no cardiac source of emboli.  She is currently with heart monitor.   Her ASA 81mg  was switched to Plavix 75mg  and she was advised to take atorvastatin daily.  She was advised to stop Imitrex.  Tylenol is ineffective.  She takes venlafaxine XR 150mg  daily for anxiety.  PAST MEDICAL HISTORY: Past Medical History:  Diagnosis Date  . Allergy   . Anxiety   . Arthritis    back & knee  . Asthma     mild per pt shows up with resp illness  . Chronic diastolic congestive heart failure ([place])   . Colon polyps   . Diabetes mellitus without complication ([place])   . Gastric polyps   . Gastroparesis   . GERD (gastroesophageal reflux disease)   . Headache    sinus headaches and migraines at times  . Heart murmur   . History of migraine headaches   . Hyperlipidemia   . IBS (irritable bowel syndrome)   . Lumbar disc disease   . S/P aortic valve replacement with bioprosthetic valve 08/23/2016   25 mm Edwards Intuity rapid-deployment bovine pericardial tissue valve via partial upper mini sternotomy  . Sleep apnea     MEDICATIONS: Current Outpatient Medications on File Prior to Visit  Medication Sig Dispense Refill  . atorvastatin (LIPITOR) 40 MG tablet Take 1 tablet (  40 mg total) by mouth daily at 6 PM. 90 tablet 3  . Biotin 5000 MCG TABS Take 5,000 mcg by mouth daily.     . calcium-vitamin D (OSCAL WITH D) 500-200 MG-UNIT per tablet Take 2 tablets by mouth daily.     . clindamycin (CLEOCIN) 300 MG capsule TAKE 2 CAPSULES BY MOUTH 1 HOUR PRIOR TOO DENTAL CLEANING. 2 capsule 0  . clopidogrel (PLAVIX) 75 MG tablet Take 1 tablet (75 mg total) by mouth daily. 90 tablet 3  . fluticasone (FLONASE) 50 MCG/ACT nasal spray Place 2  sprays into both nostrils daily.     . furosemide (LASIX) 40 MG tablet Take 1 tablet (40 mg total) by mouth daily. 60 tablet 11  . glucose blood (ONETOUCH VERIO) test strip Use to test blood sugars daily. Dx: E11.9 100 each 12  . Lancets (ONETOUCH ULTRASOFT) lancets Use to test blood sugars daily. Dx: E11.9 100 each 12  . loratadine (CLARITIN) 10 MG tablet Take 10 mg by mouth at bedtime.     . metFORMIN (GLUCOPHAGE) 500 MG tablet TAKE 1 TABLET WITH BREAKFAST AND 1/2 TO 1 TABLET WITH DINNER. 180 tablet 1  . metoCLOPramide (REGLAN) 10 MG tablet TAKE 1/2 TABLET BY MOUTH 4 TIMES A DAY 60 tablet 5  . metoprolol tartrate (LOPRESSOR) 25 MG tablet Take 0.5 tablets (12.5 mg total) by mouth 2 (two) times daily. 60 tablet 11  . Multiple Vitamin (MULTIVITAMIN WITH MINERALS) TABS tablet Take 1 tablet by mouth daily.    . naproxen sodium (ALEVE) 220 MG tablet Take 220 mg by mouth.    [name] omeprazole (PRILOSEC OTC) 20 MG tablet Take 40 mg by mouth daily with breakfast.    . potassium chloride SA (K-DUR,KLOR-CON) 20 MEQ tablet Take 1 tablet (20 mEq total) by mouth daily. 60 tablet 11  . Probiotic Product (ALIGN) 4 MG CAPS Take 4 mg by mouth daily.     . psyllium (METAMUCIL SMOOTH TEXTURE) 28 % packet Take 1 packet by mouth daily before breakfast.     . venlafaxine XR (EFFEXOR-XR) 150 MG 24 hr capsule TAKE ONE CAPSULE BY MOUTH DAILY WITH BREAKFAST 90 capsule 1   No current facility-administered medications on file prior to visit.     ALLERGIES: Allergies  Allergen Reactions  . Augmentin [Amoxicillin-Pot Clavulanate] Rash    rash  . Erythromycin Nausea Only  . Penicillins Itching and Rash    Has patient had a PCN reaction causing immediate rash, facial/tongue/throat swelling, SOB or lightheadedness with hypotension: No Has patient had a PCN reaction causing severe rash involving mucus membranes or skin necrosis: No Has patient had a PCN reaction that required hospitalization: No Has patient had a PCN  reaction occurring within the last 10 years: No  If all of the above answers are "NO", then may proceed with Cephalosporin use.     FAMILY HISTORY: Family History  Problem Relation Age of Onset  . COPD Mother   . Colon polyps Mother   . Irritable bowel syndrome Mother   . Heart disease Father   . Alcohol abuse Father   . Colon polyps Maternal Aunt     SOCIAL HISTORY: Social History   Socioeconomic History  . Marital status: Married    Spouse name: Not on file  . Number of children: 1  . Years of education: Not on file  . Highest education level: Not on file  Occupational History  . Occupation: Retired    Fish farm manager: RETIRED  Social Needs  .  Financial resource strain: Not on file  . Food insecurity:    Worry: Not on file    Inability: Not on file  . Transportation needs:    Medical: Not on file    Non-medical: Not on file  Tobacco Use  . Smoking status: Never Smoker  . Smokeless tobacco: Never Used  Substance and Sexual Activity  . Alcohol use: No  . Drug use: No  . Sexual activity: Yes  Lifestyle  . Physical activity:    Days per week: Not on file    Minutes per session: Not on file  . Stress: Not on file  Relationships  . Social connections:    Talks on phone: Not on file    Gets together: Not on file    Attends religious service: Not on file    Active member of club or organization: Not on file    Attends meetings of clubs or organizations: Not on file    Relationship status: Not on file  . Intimate partner violence:    Fear of current or ex partner: Not on file    Emotionally abused: Not on file    Physically abused: Not on file    Forced sexual activity: Not on file  Other Topics Concern  . Not on file  Social History Narrative   She lives with husband (1978) and two dogs. Step-son [name] (1971-psychiatrist in [place]). No grandkids. 2 dogs-sheltie/collie mix and border collie mix      Highest level of education:  Master in education   She is  retired Animal nutritionist x 32 years.      Hobbies: time with dogs             REVIEW OF SYSTEMS: Constitutional: No fevers, chills, or sweats, no generalized fatigue, change in appetite Eyes: No visual changes, double vision, eye pain Ear, nose and throat: No hearing loss, ear pain, nasal congestion, sore throat Cardiovascular: No chest pain, palpitations Respiratory:  No shortness of breath at rest or with exertion, wheezes GastrointestinaI: No nausea, vomiting, diarrhea, abdominal pain, fecal incontinence Genitourinary:  No dysuria, urinary retention or frequency Musculoskeletal:  No neck pain, back pain Integumentary: No rash, pruritus, skin lesions Neurological: as above Psychiatric: No depression, insomnia, anxiety Endocrine: No palpitations, fatigue, diaphoresis, mood swings, change in appetite, change in weight, increased thirst Hematologic/Lymphatic:  No purpura, petechiae. Allergic/Immunologic: no itchy/runny eyes, nasal congestion, recent allergic reactions, rashes  PHYSICAL EXAM: Vitals:   12/24/17 1320  BP: 132/60  Pulse: 64  SpO2: 98%   General: No acute distress.  Patient appears well-groomed.   Head:  Normocephalic/atraumatic Eyes:  Fundi examined but not visualized Neck: supple, no paraspinal tenderness, full range of motion Heart:  Regular rate and rhythm Lungs:  Clear to auscultation bilaterally Back: No paraspinal tenderness Neurological Exam: alert and oriented to person, place, and time. Attention span and concentration intact, recent and remote memory intact, fund of knowledge intact.  Speech fluent and not dysarthric, language intact.  CN II-XII intact. Bulk and tone normal, muscle strength 5/5 throughout.  Sensation to light touch  intact.  Deep tendon reflexes 2+ throughout.  Finger to nose testing intact.  Gait steady, Romberg negative.  IMPRESSION: Migraine with persistent aura, with cerebral infarction Hypertension Type 2 diabetes  mellitus Hyperlipidemia  PLAN: 1.  Plavix for secondary stroke prevention 2.  Lipitor (LDL at goal less than 70) 3.  Continue blood pressure and glycemic control 4.  Venlafaxine XR 150mg  daily for migraine prophylaxis  5.  For abortive therapy:  Ibuprofen or Excedrin with metochlopramide 6.  Follow up in 6 months.  Metta Clines, DO  CC:  Dr. Yong Channel

## 2017-12-30 ENCOUNTER — Ambulatory Visit: Payer: Medicare Other | Admitting: Physical Therapy

## 2017-12-30 DIAGNOSIS — M25561 Pain in right knee: Secondary | ICD-10-CM | POA: Diagnosis not present

## 2017-12-30 DIAGNOSIS — R262 Difficulty in walking, not elsewhere classified: Secondary | ICD-10-CM

## 2017-12-30 NOTE — Therapy (Signed)
[facility] [place], [place], [address] Phone: [phone]   Fax:  [phone]  Physical Therapy Treatment  Patient Details  Name: [name] MRN: [id number] Date of Birth: March 10, 1951 Referring Provider: T. Percell Miller   Encounter Date: 12/30/2017  PT End of Session - 12/30/17 1450    Visit Number  15    Date for PT Re-Evaluation  01/05/18    PT Start Time  1400    PT Stop Time  1510    PT Time Calculation (min)  70 min       Past Medical History:  Diagnosis Date  . Allergy   . Anxiety   . Arthritis    back & knee  . Asthma     mild per pt shows up with resp illness  . Chronic diastolic congestive heart failure ([place])   . Colon polyps   . Diabetes mellitus without complication ([facility])   . Gastric polyps   . Gastroparesis   . GERD (gastroesophageal reflux disease)   . Headache    sinus headaches and migraines at times  . Heart murmur   . History of migraine headaches   . Hyperlipidemia   . IBS (irritable bowel syndrome)   . Lumbar disc disease   . S/P aortic valve replacement with bioprosthetic valve 08/23/2016   25 mm Edwards Intuity rapid-deployment bovine pericardial tissue valve via partial upper mini sternotomy  . Sleep apnea     Past Surgical History:  Procedure Laterality Date  . ABDOMINAL HYSTERECTOMY    . AORTIC VALVE REPLACEMENT N/A 08/23/2016   Procedure: AORTIC VALVE REPLACEMENT (AVR) - using partial Upper Sternotomy- 36m Edwards Intuity Aortic Valve used;  Surgeon: CRexene Alberts MD;  [facility]  Service: Open Heart Surgery;  Laterality: N/A;  . BLADDER SUSPENSION  2007  . BREAST ENHANCEMENT SURGERY  1980  . CARPAL TUNNEL RELEASE Bilateral   . COLONOSCOPY    . DORSAL COMPARTMENT RELEASE Right 09/15/2014   Procedure: RIGHT WRIST DEQUERVAINS RELEASE ;  Surgeon: DKathryne Hitch MD;  [facility]  Service: Orthopedics;  Laterality: Right;  .  ESOPHAGOGASTRODUODENOSCOPY    . KNEE ARTHROSCOPY  04/15/2012   Procedure: ARTHROSCOPY KNEE;  Surgeon: DNinetta Lights MD;  [place] [facility]  Service: Orthopedics;  Laterality: Right;  . LAPAROSCOPIC CHOLECYSTECTOMY    . LASIK    . OOPHORECTOMY    . PALATE TO GINGIVA GRAFT  2017  . RIGHT/LEFT HEART CATH AND CORONARY ANGIOGRAPHY N/A 08/02/2016   Procedure: Right/Left Heart Cath and Coronary Angiography;  Surgeon: MSherren Mocha MD;  [facility];  Service: Cardiovascular;  Laterality: N/A;  . ROTATOR CUFF REPAIR Left   . TEE WITHOUT CARDIOVERSION N/A 08/23/2016   Procedure: TRANSESOPHAGEAL ECHOCARDIOGRAM (TEE);  Surgeon: CRexene Alberts MD;  [facility] [facility]  Service: Open Heart Surgery;  Laterality: N/A;  . TOTAL KNEE ARTHROPLASTY  04/15/2012   Procedure: TOTAL KNEE ARTHROPLASTY;  Surgeon: DNinetta Lights MD;  [facility] [facility]  Service: Orthopedics;  Laterality: Right;  . TRIGGER FINGER RELEASE Right 04/20/2015   Procedure: RIGHT TRIGGER FINGER RELEASE (TENDON SHEALTH INCISION) ;  Surgeon: DNinetta Lights MD;  [facility]  Service: Orthopedics;  Laterality: Right;    There were no vitals filed for this visit.  Subjective Assessment - 12/30/17 1415    Subjective  feeling better definately but still can not lie on left side  Currently in Pain?  Yes    Pain Score  4     Pain Location  Knee    Pain Orientation  Right;Left                       [facility] Adult PT Treatment/Exercise - 12/30/17 0001      Knee/Hip Exercises: Aerobic   Nustep  Level 5 x 42mnutes      Knee/Hip Exercises: Machines for Strengthening   Cybex Knee Extension  10# 3x10    Cybex Knee Flexion  25# 3x10    Cybex Leg Press  40# 3x15 feet 3 ways      Knee/Hip Exercises: Standing   Lateral Step Up  Both;15 reps;Hand Hold: 2;Step Height: 6" opp leg abd    Forward Step Up  Both;15 reps;Hand Hold: 2;Step Height: 6" opp leg ext    Walking with Sports Cord   resisted side steps 40lb x3 each fwd and back 5 times each    Other Standing Knee Exercises  side step red tband      Knee/Hip Exercises: Seated   Ball Squeeze  25    Clamshell with TheraBand  Green    Marching  Strengthening;Both;15 reps green tband      Cryotherapy   Number Minutes Cryotherapy  15 Minutes    Cryotherapy Location  Knee    Type of Cryotherapy  Ice pack      Electrical Stimulation   Electrical Stimulation Location  L ITB    Electrical Stimulation Action  IFC    Electrical Stimulation Parameters  supine    Electrical Stimulation Goals  Pain      Manual Therapy   Manual Therapy  Soft tissue mobilization    Soft tissue mobilization  ITB         After education and consent was given, dry needling to L Lateral quads, glut max and piriformis with good twitch responses.       PT Short Term Goals - 11/20/17 1530      PT SHORT TERM GOAL #1   Title  independent with intial HEP    Status  Achieved        PT Long Term Goals - 12/22/17 1426      PT LONG TERM GOAL #1   Title  decrease pain 50%      PT LONG TERM GOAL #2   Title  returns safely to the gym    Status  Partially Met      PT LONG TERM GOAL #3   Title  go up and down steps without difficulty    Status  Partially Met      PT LONG TERM GOAL #4   Title  report no difficukty getting up from sitting    Status  Achieved            Plan - 12/30/17 1450    Clinical Impression Statement  pt tolerated ther ex very well , no pain but fatigued. decreased tightness in IT but trigger pts still present    PT Treatment/Interventions  ADLs/Self Care Home Management;Cryotherapy;Electrical Stimulation;Iontophoresis 484mml Dexamethasone;Moist Heat;Gait training;Neuromuscular re-education;Balance training;Therapeutic exercise;Therapeutic activities;Functional mobility training;Stair training;Patient/family education;Manual techniques    PT Next Visit Plan  Hip strengthening and ITB stretching        Patient will benefit from skilled therapeutic intervention in order to improve the following deficits and impairments:  Abnormal gait, Decreased range of motion, Difficulty walking, Decreased endurance, Decreased activity tolerance, Pain,  Impaired flexibility, Decreased strength, Decreased mobility  Visit Diagnosis: Acute pain of right knee  Difficulty in walking, not elsewhere classified     Problem List Patient Active Problem List   Diagnosis Date Noted  . History of CVA in adulthood 08/13/2017  . Blurry vision 08/13/2017  . Hypertension 01/02/2017  . S/P minimally invasive aortic valve replacement with bioprosthetic valve 08/23/2016  . Chronic diastolic congestive heart failure ([facility])   . Osteopenia 05/27/2016  . OSA (obstructive sleep apnea) 02/27/2016  . Bruxism 10/23/2015  . Elevated alkaline phosphatase level 05/16/2015  . Atypical chest pain 11/10/2014  . Acne 05/12/2014  . Diabetes mellitus type 2, controlled ([facility]) 05/12/2014  . GERD (gastroesophageal reflux disease) 03/10/2014  . Generalized anxiety disorder 03/10/2014  . Migraines 03/10/2014  . EUSTACHIAN TUBE DYSFUNCTION, CHRONIC 06/14/2010  . Irritable bowel syndrome 12/15/2008  . OBESITY, MORBID 10/12/2008  . Low back pain 08/11/2008  . Hyperlipidemia 02/09/2008  . Allergic rhinitis 02/12/2007  . ASTHMA 02/12/2007    Stephan Nelis,ANGIE PTA 12/30/2017, 2:53 PM  [facility] [address] [place], [place], [address] Phone: [phone]   Fax:  [phone]  Name: [name] MRN: [id number] Date of Birth: June 14, 1951   Madelyn Flavors, PT 12/30/17 3:21 PM; [facility] [place] [address], [place], [address] Phone: [phone]   Fax:  [phone]

## 2018-01-01 ENCOUNTER — Encounter: Payer: Self-pay | Admitting: Physical Therapy

## 2018-01-01 ENCOUNTER — Ambulatory Visit: Payer: Medicare Other | Admitting: Physical Therapy

## 2018-01-01 DIAGNOSIS — R262 Difficulty in walking, not elsewhere classified: Secondary | ICD-10-CM

## 2018-01-01 DIAGNOSIS — M25561 Pain in right knee: Secondary | ICD-10-CM

## 2018-01-01 DIAGNOSIS — M25562 Pain in left knee: Secondary | ICD-10-CM

## 2018-01-01 NOTE — Therapy (Signed)
[place], [place], [address] Phone: [phone]   Fax:  [phone]  Physical Therapy Treatment  Patient Details  Name: [name] MRN: [id number] Date of Birth: 12-Sep-1950 Referring Provider: T. Percell Miller   Encounter Date: 01/01/2018  PT End of Session - 01/01/18 1524    Visit Number  16    Date for PT Re-Evaluation  01/05/18    PT Start Time  1445    PT Stop Time  1545    PT Time Calculation (min)  60 min    Activity Tolerance  Patient tolerated treatment well    Behavior During Therapy  [facility] for tasks assessed/performed       Past Medical History:  Diagnosis Date  . Allergy   . Anxiety   . Arthritis    back & knee  . Asthma     mild per pt shows up with resp illness  . Chronic diastolic congestive heart failure ([place])   . Colon polyps   . Diabetes mellitus without complication ([place])   . Gastric polyps   . Gastroparesis   . GERD (gastroesophageal reflux disease)   . Headache    sinus headaches and migraines at times  . Heart murmur   . History of migraine headaches   . Hyperlipidemia   . IBS (irritable bowel syndrome)   . Lumbar disc disease   . S/P aortic valve replacement with bioprosthetic valve 08/23/2016   25 mm Edwards Intuity rapid-deployment bovine pericardial tissue valve via partial upper mini sternotomy  . Sleep apnea     Past Surgical History:  Procedure Laterality Date  . ABDOMINAL HYSTERECTOMY    . AORTIC VALVE REPLACEMENT N/A 08/23/2016   Procedure: AORTIC VALVE REPLACEMENT (AVR) - using partial Upper Sternotomy- 30m Edwards Intuity Aortic Valve used;  Surgeon: CRexene Alberts MD;  [place]  Service: Open Heart Surgery;  Laterality: N/A;  . BLADDER SUSPENSION  2007  . BREAST ENHANCEMENT SURGERY  1980  . CARPAL TUNNEL RELEASE Bilateral   . COLONOSCOPY    . DORSAL COMPARTMENT RELEASE Right 09/15/2014   Procedure: RIGHT WRIST DEQUERVAINS RELEASE ;   Surgeon: DKathryne Hitch MD;  [place]  Service: Orthopedics;  Laterality: Right;  . ESOPHAGOGASTRODUODENOSCOPY    . KNEE ARTHROSCOPY  04/15/2012   Procedure: ARTHROSCOPY KNEE;  Surgeon: DNinetta Lights MD;  [place]  Service: Orthopedics;  Laterality: Right;  . LAPAROSCOPIC CHOLECYSTECTOMY    . LASIK    . OOPHORECTOMY    . PALATE TO GINGIVA GRAFT  2017  . RIGHT/LEFT HEART CATH AND CORONARY ANGIOGRAPHY N/A 08/02/2016   Procedure: Right/Left Heart Cath and Coronary Angiography;  Surgeon: MSherren Mocha MD;  [place] [facility];  Service: Cardiovascular;  Laterality: N/A;  . ROTATOR CUFF REPAIR Left   . TEE WITHOUT CARDIOVERSION N/A 08/23/2016   Procedure: TRANSESOPHAGEAL ECHOCARDIOGRAM (TEE);  Surgeon: CRexene Alberts MD;  [place]: [place]  Service: Open Heart Surgery;  Laterality: N/A;  . TOTAL KNEE ARTHROPLASTY  04/15/2012   Procedure: TOTAL KNEE ARTHROPLASTY;  Surgeon: DNinetta Lights MD;  [place]  Service: Orthopedics;  Laterality: Right;  . TRIGGER FINGER RELEASE Right 04/20/2015   Procedure: RIGHT TRIGGER FINGER RELEASE (TENDON SHEALTH INCISION) ;  Surgeon: DNinetta Lights MD;  Location: [place]  Service: Orthopedics;  Laterality: Right;    There were no vitals filed for this visit.  Subjective Assessment - 01/01/18 1452    Subjective  Pt reports "stomach not at 100% today, feels that, "the IT band problem seems to be loosening."    Pertinent History  she does report a TIA in February, right TKR 2013    Limitations  Standing;Walking;House hold activities    Currently in Pain?  Yes    Pain Score  4     Pain Location  Hip                       [facility] Adult PT Treatment/Exercise - 01/01/18 0001      Knee/Hip Exercises: Aerobic   Nustep  Level 5 x 76mnutes      Knee/Hip Exercises: Machines for Strengthening   Cybex Knee Extension  10# 3x10    Cybex Knee Flexion  25# 3x10    Cybex Leg Press   40# 3x15      Knee/Hip Exercises: Standing   Lateral Step Up  Both;15 reps;Hand Hold: 2;Step Height: 6"    Forward Step Up  Both;15 reps;Hand Hold: 2;Step Height: 6"      Knee/Hip Exercises: Seated   Sit to Sand  2 sets;10 reps Green theraband tied around knees      Cryotherapy   Number Minutes Cryotherapy  15 Minutes    Cryotherapy Location  Knee    Type of Cryotherapy  Ice pack      Electrical Stimulation   Electrical Stimulation Location  L ITB    Electrical Stimulation Action  IFC    Electrical Stimulation Parameters  supine    Electrical Stimulation Goals  Pain      Manual Therapy   Manual Therapy  Soft tissue mobilization    Soft tissue mobilization  ITB               PT Short Term Goals - 11/20/17 1530      PT SHORT TERM GOAL #1   Title  independent with intial HEP    Status  Achieved        PT Long Term Goals - 12/22/17 1426      PT LONG TERM GOAL #1   Title  decrease pain 50%      PT LONG TERM GOAL #2   Title  returns safely to the gym    Status  Partially Met      PT LONG TERM GOAL #3   Title  go up and down steps without difficulty    Status  Partially Met      PT LONG TERM GOAL #4   Title  report no difficukty getting up from sitting    Status  Achieved            Plan - 01/01/18 1524    Clinical Impression Statement  Pt stated that she feels like IT issues are resolving and notices treatment helping. Pt tolerated exercise well with ample rest breaks. Pt progressed to functional squat tasks from more isolated exercise as she stated a desire to work on tasks that transfered to home. It tightness decreased but still present.     PT Frequency  2x / week    PT Duration  8 weeks    PT Treatment/Interventions  ADLs/Self Care Home Management;Cryotherapy;Electrical Stimulation;Iontophoresis 435mml Dexamethasone;Moist Heat;Gait training;Neuromuscular re-education;Balance training;Therapeutic exercise;Therapeutic activities;Functional mobility  training;Stair training;Patient/family education;Manual techniques    PT Next Visit Plan  Hip strengthening and ITB stretching       Patient will benefit from skilled  therapeutic intervention in order to improve the following deficits and impairments:  Abnormal gait, Decreased range of motion, Difficulty walking, Decreased endurance, Decreased activity tolerance, Pain, Impaired flexibility, Decreased strength, Decreased mobility  Visit Diagnosis: Acute pain of right knee  Difficulty in walking, not elsewhere classified  Acute pain of left knee     Problem List Patient Active Problem List   Diagnosis Date Noted  . History of CVA in adulthood 08/13/2017  . Blurry vision 08/13/2017  . Hypertension 01/02/2017  . S/P minimally invasive aortic valve replacement with bioprosthetic valve 08/23/2016  . Chronic diastolic congestive heart failure ([place])   . Osteopenia 05/27/2016  . OSA (obstructive sleep apnea) 02/27/2016  . Bruxism 10/23/2015  . Elevated alkaline phosphatase level 05/16/2015  . Atypical chest pain 11/10/2014  . Acne 05/12/2014  . Diabetes mellitus type 2, controlled ([place]) 05/12/2014  . GERD (gastroesophageal reflux disease) 03/10/2014  . Generalized anxiety disorder 03/10/2014  . Migraines 03/10/2014  . EUSTACHIAN TUBE DYSFUNCTION, CHRONIC 06/14/2010  . Irritable bowel syndrome 12/15/2008  . OBESITY, MORBID 10/12/2008  . Low back pain 08/11/2008  . Hyperlipidemia 02/09/2008  . Allergic rhinitis 02/12/2007  . ASTHMA 02/12/2007    Maryelizabeth Kaufmann, SPTA 01/01/2018, 3:34 PM  [place], [place], [address] Phone: [phone]   Fax:  [phone]  Name: SARELY STRACENER MRN: [id number] Date of Birth: [birth year]

## 2018-01-06 ENCOUNTER — Ambulatory Visit: Payer: Medicare Other | Admitting: Physical Therapy

## 2018-01-06 DIAGNOSIS — M25561 Pain in right knee: Secondary | ICD-10-CM

## 2018-01-06 DIAGNOSIS — M25562 Pain in left knee: Secondary | ICD-10-CM

## 2018-01-06 DIAGNOSIS — R262 Difficulty in walking, not elsewhere classified: Secondary | ICD-10-CM

## 2018-01-06 NOTE — Therapy (Signed)
[place] [address] [place], [place], [address] Phone: [phone]   Fax:  [phone]  Patient Details  Name: [name] MRN: [id number] Date of Birth: Oct 02, 1950 Referring Provider:  [name], MD  Encounter Date: 01/06/2018   Laqueta Carina 01/06/2018, 3:00 PM  [place], [place], [address] Phone: [phone]   Fax:  [phone]

## 2018-01-06 NOTE — Therapy (Signed)
[place], [place], [address] Phone: [phone]   Fax:  [phone]  Physical Therapy Treatment  Patient Details  Name: [name] MRN: [id number] Date of Birth: 01-22-1951 Referring Provider: T. Percell Miller   Encounter Date: 01/06/2018  PT End of Session - 01/06/18 1403    Visit Number  17    Date for PT Re-Evaluation  01/05/18    PT Start Time  9924    PT Stop Time  1458    PT Time Calculation (min)  60 min    Activity Tolerance  Patient tolerated treatment well    Behavior During Therapy  [facility] for tasks assessed/performed       Past Medical History:  Diagnosis Date  . Allergy   . Anxiety   . Arthritis    back & knee  . Asthma     mild per pt shows up with resp illness  . Chronic diastolic congestive heart failure ([place])   . Colon polyps   . Diabetes mellitus without complication ([place])   . Gastric polyps   . Gastroparesis   . GERD (gastroesophageal reflux disease)   . Headache    sinus headaches and migraines at times  . Heart murmur   . History of migraine headaches   . Hyperlipidemia   . IBS (irritable bowel syndrome)   . Lumbar disc disease   . S/P aortic valve replacement with bioprosthetic valve 08/23/2016   25 mm Edwards Intuity rapid-deployment bovine pericardial tissue valve via partial upper mini sternotomy  . Sleep apnea     Past Surgical History:  Procedure Laterality Date  . ABDOMINAL HYSTERECTOMY    . AORTIC VALVE REPLACEMENT N/A 08/23/2016   Procedure: AORTIC VALVE REPLACEMENT (AVR) - using partial Upper Sternotomy- 26m Edwards Intuity Aortic Valve used;  Surgeon: CRexene Alberts MD;  [place]  Service: Open Heart Surgery;  Laterality: N/A;  . BLADDER SUSPENSION  2007  . BREAST ENHANCEMENT SURGERY  1980  . CARPAL TUNNEL RELEASE Bilateral   . COLONOSCOPY    . DORSAL COMPARTMENT RELEASE Right 09/15/2014   Procedure: RIGHT WRIST DEQUERVAINS RELEASE ;   Surgeon: DKathryne Hitch MD;  Location[place] [facility]  Service: Orthopedics;  Laterality: Right;  . ESOPHAGOGASTRODUODENOSCOPY    . KNEE ARTHROSCOPY  04/15/2012   Procedure: ARTHROSCOPY KNEE;  Surgeon: DNinetta Lights MD;  [place]  Service: Orthopedics;  Laterality: Right;  . LAPAROSCOPIC CHOLECYSTECTOMY    . LASIK    . OOPHORECTOMY    . PALATE TO GINGIVA GRAFT  2017  . RIGHT/LEFT HEART CATH AND CORONARY ANGIOGRAPHY N/A 08/02/2016   Procedure: Right/Left Heart Cath and Coronary Angiography;  Surgeon: MSherren Mocha MD;  [facility];  Service: Cardiovascular;  Laterality: N/A;  . ROTATOR CUFF REPAIR Left   . TEE WITHOUT CARDIOVERSION N/A 08/23/2016   Procedure: TRANSESOPHAGEAL ECHOCARDIOGRAM (TEE);  Surgeon: CRexene Alberts MD;  Location: [place]  Service: Open Heart Surgery;  Laterality: N/A;  . TOTAL KNEE ARTHROPLASTY  04/15/2012   Procedure: TOTAL KNEE ARTHROPLASTY;  Surgeon: DNinetta Lights MD;  Location[place]  Service: Orthopedics;  Laterality: Right;  . TRIGGER FINGER RELEASE Right 04/20/2015   Procedure: RIGHT TRIGGER FINGER RELEASE (TENDON SHEALTH INCISION) ;  Surgeon: DNinetta Lights MD;  [place]  Service: Orthopedics;  Laterality: Right;    There were no vitals filed for this visit.  Subjective Assessment - 01/06/18 1359    Subjective  Pt reports feeling good today. States the only toruble she seems to have is going up stairs and laying on left side.     Currently in Pain?  No/denies                       [facility] Adult PT Treatment/Exercise - 01/06/18 0001      Ambulation/Gait   Stairs  Yes    Stairs Assistance  6: Modified independent (Device/Increase time)    Stair Management Technique  One rail Left    Number of Stairs  24 focus on going up      Knee/Hip Exercises: Aerobic   Nustep  Level 5 x 55mnutes      Knee/Hip Exercises: Machines for Strengthening   Cybex Knee Extension  15# 3x10     Cybex Knee Flexion  25# 3x10    Cybex Leg Press  40# 3x15      Knee/Hip Exercises: Standing   Hip Abduction  Left;2 sets;15 reps 5 lb. cable, Cueing to keep leg from ER and more neutral     Forward Step Up  Left;2 sets;10 reps;Hand Hold: 1 red theraband pulling into valgus on left knee    Other Standing Knee Exercises  side step red tband Up tall with no hip flexion to activate glute medius      Knee/Hip Exercises: Seated   Sit to Sand  2 sets;10 reps      Knee/Hip Exercises: Sidelying   Hip ABduction  Left;2 sets;10 reps      Cryotherapy   Number Minutes Cryotherapy  15 Minutes    Cryotherapy Location  Knee    Type of Cryotherapy  Ice pack      Electrical Stimulation   Electrical Stimulation Location  L ITB    Electrical Stimulation Action  IFC    Electrical Stimulation Parameters  side lying    Electrical Stimulation Goals  Pain               PT Short Term Goals - 11/20/17 1530      PT SHORT TERM GOAL #1   Title  independent with intial HEP    Status  Achieved        PT Long Term Goals - 12/22/17 1426      PT LONG TERM GOAL #1   Title  decrease pain 50%      PT LONG TERM GOAL #2   Title  returns safely to the gym    Status  Partially Met      PT LONG TERM GOAL #3   Title  go up and down steps without difficulty    Status  Partially Met      PT LONG TERM GOAL #4   Title  report no difficukty getting up from sitting    Status  Achieved            Plan - 01/06/18 1447    Clinical Impression Statement  Pt tolerated treatment well with an increase in functional activities. Pt required constant verbal and tactile cueing to keep hip in neutral and not externally rotated to help better activate glute med to enhance gains from exercises. Pt cued to keep balanced pressure through the heel and midfoot to avoid excessive pressure through the toes, resulting in knee discomfort when climbing stairs.     PT Frequency  2x / week    PT Duration  8 weeks  PT Treatment/Interventions  ADLs/Self Care Home Management;Cryotherapy;Electrical Stimulation;Iontophoresis 38m/ml Dexamethasone;Moist Heat;Gait training;Neuromuscular re-education;Balance training;Therapeutic exercise;Therapeutic activities;Functional mobility training;Stair training;Patient/family education;Manual techniques    PT Next Visit Plan  Hip strengthening and ITB stretching,        Patient will benefit from skilled therapeutic intervention in order to improve the following deficits and impairments:  Abnormal gait, Decreased range of motion, Difficulty walking, Decreased endurance, Decreased activity tolerance, Pain, Impaired flexibility, Decreased strength, Decreased mobility  Visit Diagnosis: Acute pain of right knee  Difficulty in walking, not elsewhere classified  Acute pain of left knee     Problem List Patient Active Problem List   Diagnosis Date Noted  . History of CVA in adulthood 08/13/2017  . Blurry vision 08/13/2017  . Hypertension 01/02/2017  . S/P minimally invasive aortic valve replacement with bioprosthetic valve 08/23/2016  . Chronic diastolic congestive heart failure ([place]   . Osteopenia 05/27/2016  . OSA (obstructive sleep apnea) 02/27/2016  . Bruxism 10/23/2015  . Elevated alkaline phosphatase level 05/16/2015  . Atypical chest pain 11/10/2014  . Acne 05/12/2014  . Diabetes mellitus type 2, controlled ([place] 05/12/2014  . GERD (gastroesophageal reflux disease) 03/10/2014  . Generalized anxiety disorder 03/10/2014  . Migraines 03/10/2014  . EUSTACHIAN TUBE DYSFUNCTION, CHRONIC 06/14/2010  . Irritable bowel syndrome 12/15/2008  . OBESITY, MORBID 10/12/2008  . Low back pain 08/11/2008  . Hyperlipidemia 02/09/2008  . Allergic rhinitis 02/12/2007  . ASTHMA 02/12/2007    SMaryelizabeth Kaufmann SPTA 01/06/2018, 2:52 PM  [place] [address]: [phone]   Fax:  [phone] Name: Hayley HAPPEMRN: [id number] of Birth: 6Dec 27, 1952

## 2018-01-07 ENCOUNTER — Encounter: Payer: Self-pay | Admitting: Physical Therapy

## 2018-01-07 ENCOUNTER — Ambulatory Visit: Payer: Medicare Other | Admitting: Physical Therapy

## 2018-01-07 DIAGNOSIS — M25561 Pain in right knee: Secondary | ICD-10-CM

## 2018-01-07 DIAGNOSIS — R262 Difficulty in walking, not elsewhere classified: Secondary | ICD-10-CM

## 2018-01-07 DIAGNOSIS — M25562 Pain in left knee: Secondary | ICD-10-CM

## 2018-01-07 NOTE — Therapy (Signed)
[place], [place], [address] Phone: [phone]   Fax:  [phone]  Physical Therapy Treatment  Patient Details  Name: [name] MRN: [id number] Date of Birth: 05/20/1951 Referring Provider: T. Percell Miller   Encounter Date: 01/07/2018  PT End of Session - 01/07/18 1441    Visit Number  18    Date for PT Re-Evaluation  02/06/18    PT Start Time  1357    PT Stop Time  1458    PT Time Calculation (min)  61 min    Activity Tolerance  Patient tolerated treatment well    Behavior During Therapy  [facility] for tasks assessed/performed       Past Medical History:  Diagnosis Date  . Allergy   . Anxiety   . Arthritis    back & knee  . Asthma     mild per pt shows up with resp illness  . Chronic diastolic congestive heart failure ([place])   . Colon polyps   . Diabetes mellitus without complication ([place])   . Gastric polyps   . Gastroparesis   . GERD (gastroesophageal reflux disease)   . Headache    sinus headaches and migraines at times  . Heart murmur   . History of migraine headaches   . Hyperlipidemia   . IBS (irritable bowel syndrome)   . Lumbar disc disease   . S/P aortic valve replacement with bioprosthetic valve 08/23/2016   25 mm Edwards Intuity rapid-deployment bovine pericardial tissue valve via partial upper mini sternotomy  . Sleep apnea     Past Surgical History:  Procedure Laterality Date  . ABDOMINAL HYSTERECTOMY    . AORTIC VALVE REPLACEMENT N/A 08/23/2016   Procedure: AORTIC VALVE REPLACEMENT (AVR) - using partial Upper Sternotomy- 30m Edwards Intuity Aortic Valve used;  Surgeon: CRexene Alberts MD;  [place]  Service: Open Heart Surgery;  Laterality: N/A;  . BLADDER SUSPENSION  2007  . BREAST ENHANCEMENT SURGERY  1980  . CARPAL TUNNEL RELEASE Bilateral   . COLONOSCOPY    . DORSAL COMPARTMENT RELEASE Right 09/15/2014   Procedure: RIGHT WRIST DEQUERVAINS RELEASE ;   Surgeon: DKathryne Hitch MD;  [place]  Service: Orthopedics;  Laterality: Right;  . ESOPHAGOGASTRODUODENOSCOPY    . KNEE ARTHROSCOPY  04/15/2012   Procedure: ARTHROSCOPY KNEE;  Surgeon: DNinetta Lights MD;  [place]  Service: Orthopedics;  Laterality: Right;  . LAPAROSCOPIC CHOLECYSTECTOMY    . LASIK    . OOPHORECTOMY    . PALATE TO GINGIVA GRAFT  2017  . RIGHT/LEFT HEART CATH AND CORONARY ANGIOGRAPHY N/A 08/02/2016   Procedure: Right/Left Heart Cath and Coronary Angiography;  Surgeon: MSherren Mocha MD;  [place];  Service: Cardiovascular;  Laterality: N/A;  . ROTATOR CUFF REPAIR Left   . TEE WITHOUT CARDIOVERSION N/A 08/23/2016   Procedure: TRANSESOPHAGEAL ECHOCARDIOGRAM (TEE);  Surgeon: CRexene Alberts MD;  [place]  Service: Open Heart Surgery;  Laterality: N/A;  . TOTAL KNEE ARTHROPLASTY  04/15/2012   Procedure: TOTAL KNEE ARTHROPLASTY;  Surgeon: DNinetta Lights MD;  [place]  Service: Orthopedics;  Laterality: Right;  . TRIGGER FINGER RELEASE Right 04/20/2015   Procedure: RIGHT TRIGGER FINGER RELEASE (TENDON SHEALTH INCISION) ;  Surgeon: DNinetta Lights MD;  [place]  Service: Orthopedics;  Laterality: Right;    There were no vitals filed for this visit.  Subjective Assessment - 01/07/18 1402    Subjective  I am pretty sore in the left hip and thigh after the new exercises     Currently in Pain?  Yes    Pain Score  4     Pain Orientation  Left    Pain Descriptors / Indicators  Sore    Aggravating Factors   exercises                       [facility] Adult PT Treatment/Exercise - 01/07/18 0001      Knee/Hip Exercises: Aerobic   Nustep  Level 5 x 82mnutes      Knee/Hip Exercises: Machines for Strengthening   Cybex Knee Extension  15# 3x10    Cybex Knee Flexion  25# 3x10    Cybex Leg Press  40# 3x15      Knee/Hip Exercises: Standing   Terminal Knee Extension  3  sets;10 reps;Left    Walking with Sports Cord  all directions    Other Standing Knee Exercises  on airex ball toss, then eyes closed      Knee/Hip Exercises: Supine   Other Supine Knee/Hip Exercises  feet on ball K2C, trunk rotation, small bridges and isometric abdominals      Knee/Hip Exercises: Sidelying   Hip ABduction  Left;2 sets;10 reps    Hip ADduction  Left;2 sets;10 reps      Cryotherapy   Number Minutes Cryotherapy  15 Minutes    Cryotherapy Location  Knee    Type of Cryotherapy  Ice pack      Electrical Stimulation   Electrical Stimulation Location  L ITB    Electrical Stimulation Action  IFC    Electrical Stimulation Parameters  sidelying    Electrical Stimulation Goals  Pain               PT Short Term Goals - 11/20/17 1530      PT SHORT TERM GOAL #1   Title  independent with intial HEP    Status  Achieved        PT Long Term Goals - 01/07/18 1443      PT LONG TERM GOAL #1   Title  decrease pain 50%    Status  Partially Met      PT LONG TERM GOAL #2   Title  returns safely to the gym    Status  Partially Met      PT LONG TERM GOAL #3   Title  go up and down steps without difficulty    Status  Partially Met            Plan - 01/07/18 1442    Clinical Impression Statement  Patient overall doing better, showing better mobility, increased ROM and function.  She still has some pain in the left hip and knee, she has increased soreness today witht hte added exercises at yesterdays visit    PT Next Visit Plan  We will continue to work on her flexibility the ITB tightness, need to add some core strength and look to D/C in the next month    Consulted and Agree with Plan of Care  Patient       Patient will benefit from skilled therapeutic intervention in order to improve the following deficits and impairments:  Abnormal gait, Decreased range of motion, Difficulty walking, Decreased endurance, Decreased activity tolerance, Pain, Impaired  flexibility, Decreased strength, Decreased mobility  Visit Diagnosis: Acute pain of right knee -  Plan: PT plan of care cert/re-cert  Difficulty in walking, not elsewhere classified - Plan: PT plan of care cert/re-cert  Acute pain of left knee - Plan: PT plan of care cert/re-cert     Problem List Patient Active Problem List   Diagnosis Date Noted  . History of CVA in adulthood 08/13/2017  . Blurry vision 08/13/2017  . Hypertension 01/02/2017  . S/P minimally invasive aortic valve replacement with bioprosthetic valve 08/23/2016  . Chronic diastolic congestive heart failure ([place])   . Osteopenia 05/27/2016  . OSA (obstructive sleep apnea) 02/27/2016  . Bruxism 10/23/2015  . Elevated alkaline phosphatase level 05/16/2015  . Atypical chest pain 11/10/2014  . Acne 05/12/2014  . Diabetes mellitus type 2, controlled ([place]) 05/12/2014  . GERD (gastroesophageal reflux disease) 03/10/2014  . Generalized anxiety disorder 03/10/2014  . Migraines 03/10/2014  . EUSTACHIAN TUBE DYSFUNCTION, CHRONIC 06/14/2010  . Irritable bowel syndrome 12/15/2008  . OBESITY, MORBID 10/12/2008  . Low back pain 08/11/2008  . Hyperlipidemia 02/09/2008  . Allergic rhinitis 02/12/2007  . ASTHMA 02/12/2007    Sumner Boast., PT 01/07/2018, 2:48 PM  [place], [place], [address] Phone: [phone]   Fax:  [phone]  Name: [name] MRN: [id number] Date of Birth: 12/02/1950

## 2018-01-08 ENCOUNTER — Encounter (INDEPENDENT_AMBULATORY_CARE_PROVIDER_SITE_OTHER): Payer: Self-pay

## 2018-01-08 ENCOUNTER — Ambulatory Visit: Payer: Medicare Other | Admitting: Physical Therapy

## 2018-01-13 ENCOUNTER — Ambulatory Visit: Payer: Medicare Other | Admitting: Physical Therapy

## 2018-01-13 DIAGNOSIS — M25561 Pain in right knee: Secondary | ICD-10-CM

## 2018-01-13 DIAGNOSIS — R262 Difficulty in walking, not elsewhere classified: Secondary | ICD-10-CM

## 2018-01-13 DIAGNOSIS — M25562 Pain in left knee: Secondary | ICD-10-CM

## 2018-01-13 NOTE — Therapy (Signed)
[place] [address] [place], [place], [address] Phone: [phone]   Fax:  [phone]  Physical Therapy Treatment  Patient Details  Name: [name] MRN: [id number] Date of Birth: 28-Nov-1950 Referring Provider: T. Percell Miller   Encounter Date: 01/13/2018  PT End of Session - 01/13/18 1320    Visit Number  19    Date for PT Re-Evaluation  02/06/18    PT Start Time  1305    PT Stop Time  1410    PT Time Calculation (min)  65 min    Activity Tolerance  Patient tolerated treatment well    Behavior During Therapy  [facility] for tasks assessed/performed       Past Medical History:  Diagnosis Date  . Allergy   . Anxiety   . Arthritis    back & knee  . Asthma     mild per pt shows up with resp illness  . Chronic diastolic congestive heart failure ([place])   . Colon polyps   . Diabetes mellitus without complication ([place])   . Gastric polyps   . Gastroparesis   . GERD (gastroesophageal reflux disease)   . Headache    sinus headaches and migraines at times  . Heart murmur   . History of migraine headaches   . Hyperlipidemia   . IBS (irritable bowel syndrome)   . Lumbar disc disease   . S/P aortic valve replacement with bioprosthetic valve 08/23/2016   25 mm Edwards Intuity rapid-deployment bovine pericardial tissue valve via partial upper mini sternotomy  . Sleep apnea     Past Surgical History:  Procedure Laterality Date  . ABDOMINAL HYSTERECTOMY    . AORTIC VALVE REPLACEMENT N/A 08/23/2016   Procedure: AORTIC VALVE REPLACEMENT (AVR) - using partial Upper Sternotomy- 42m Edwards Intuity Aortic Valve used;  Surgeon: CRexene Alberts MD;  [place]  Service: Open Heart Surgery;  Laterality: N/A;  . BLADDER SUSPENSION  2007  . BREAST ENHANCEMENT SURGERY  1980  . CARPAL TUNNEL RELEASE Bilateral   . COLONOSCOPY    . DORSAL COMPARTMENT RELEASE Right 09/15/2014   Procedure: RIGHT WRIST DEQUERVAINS RELEASE ;   Surgeon: DKathryne Hitch MD;  [place] [facility]  Service: Orthopedics;  Laterality: Right;  . ESOPHAGOGASTRODUODENOSCOPY    . KNEE ARTHROSCOPY  04/15/2012   Procedure: ARTHROSCOPY KNEE;  Surgeon: DNinetta Lights MD;  [place] [facility]  Service: Orthopedics;  Laterality: Right;  . LAPAROSCOPIC CHOLECYSTECTOMY    . LASIK    . OOPHORECTOMY    . PALATE TO GINGIVA GRAFT  2017  . RIGHT/LEFT HEART CATH AND CORONARY ANGIOGRAPHY N/A 08/02/2016   Procedure: Right/Left Heart Cath and Coronary Angiography;  Surgeon: MSherren Mocha MD;  [facility];  Service: Cardiovascular;  Laterality: N/A;  . ROTATOR CUFF REPAIR Left   . TEE WITHOUT CARDIOVERSION N/A 08/23/2016   Procedure: TRANSESOPHAGEAL ECHOCARDIOGRAM (TEE);  Surgeon: CRexene Alberts MD;  [place] [facility]  Service: Open Heart Surgery;  Laterality: N/A;  . TOTAL KNEE ARTHROPLASTY  04/15/2012   Procedure: TOTAL KNEE ARTHROPLASTY;  Surgeon: DNinetta Lights MD;  [place]  Service: Orthopedics;  Laterality: Right;  . TRIGGER FINGER RELEASE Right 04/20/2015   Procedure: RIGHT TRIGGER FINGER RELEASE (TENDON SHEALTH INCISION) ;  Surgeon: DNinetta Lights MD;  [place] [facility]  Service: Orthopedics;  Laterality: Right;    There were no vitals filed for this visit.  Subjective Assessment - 01/13/18 1313    Subjective  Pt reports feeling pretty good and having stayed busy recently. Stated she felt fine after previous session.     Currently in Pain?  Yes    Pain Score  2     Pain Location  Hip    Pain Orientation  Left                       [facility] Adult PT Treatment/Exercise - 01/13/18 0001      Knee/Hip Exercises: Aerobic   Nustep  Level 5 x 16mnutes      Knee/Hip Exercises: Machines for Strengthening   Cybex Knee Extension  15# 3x10    Cybex Knee Flexion  25# 3x10    Cybex Leg Press  40# 3x15      Knee/Hip Exercises: Standing   Terminal Knee Extension  3 sets;10  reps;Left;Theraband red    Hip Abduction  Left;3 sets;10 reps    Forward Step Up  Left;2 sets;10 reps;Hand Hold: 1      Knee/Hip Exercises: Seated   Sit to Sand  10 reps;3 sets yellow ball extended in front with yellow tband around knees      Knee/Hip Exercises: Sidelying   Hip ABduction  Left;2 sets;10 reps    Hip ADduction  Left;2 sets;10 reps      Cryotherapy   Number Minutes Cryotherapy  15 Minutes    Cryotherapy Location  Knee    Type of Cryotherapy  Ice pack      Electrical Stimulation   Electrical Stimulation Location  L ITB    Electrical Stimulation Action  IFC    Electrical Stimulation Parameters  sidelying    Electrical Stimulation Goals  Pain               PT Short Term Goals - 11/20/17 1530      PT SHORT TERM GOAL #1   Title  independent with intial HEP    Status  Achieved        PT Long Term Goals - 01/07/18 1443      PT LONG TERM GOAL #1   Title  decrease pain 50%    Status  Partially Met      PT LONG TERM GOAL #2   Title  returns safely to the gym    Status  Partially Met      PT LONG TERM GOAL #3   Title  go up and down steps without difficulty    Status  Partially Met            Plan - 01/13/18 1326    Clinical Impression Statement  Pt doing better overall. Pt tolerates resisted exercise well but fatigues quickly. Pt still reports pain in the left knee. Pt reported stairs still being difficult and painful, but that she doesn't feel like practicing the stairs is helping. Pt requires tactile and verbal cues to keep even pressure through the foot during the sit to stands and avoid excessive stress on the knee.     Rehab Potential  Good    PT Frequency  2x / week    PT Duration  8 weeks    PT Treatment/Interventions  ADLs/Self Care Home Management;Cryotherapy;Electrical Stimulation;Iontophoresis 464mml Dexamethasone;Moist Heat;Gait training;Neuromuscular re-education;Balance training;Therapeutic exercise;Therapeutic activities;Functional  mobility training;Stair training;Patient/family education;Manual techniques    PT Next Visit Plan  We will continue to work on her flexibility the ITB tightness, need to add some core strength and look  to D/C in the next month       Patient will benefit from skilled therapeutic intervention in order to improve the following deficits and impairments:  Abnormal gait, Decreased range of motion, Difficulty walking, Decreased endurance, Decreased activity tolerance, Pain, Impaired flexibility, Decreased strength, Decreased mobility  Visit Diagnosis: Acute pain of right knee  Acute pain of left knee  Difficulty in walking, not elsewhere classified     Problem List Patient Active Problem List   Diagnosis Date Noted  . History of CVA in adulthood 08/13/2017  . Blurry vision 08/13/2017  . Hypertension 01/02/2017  . S/P minimally invasive aortic valve replacement with bioprosthetic valve 08/23/2016  . Chronic diastolic congestive heart failure ([place])   . Osteopenia 05/27/2016  . OSA (obstructive sleep apnea) 02/27/2016  . Bruxism 10/23/2015  . Elevated alkaline phosphatase level 05/16/2015  . Atypical chest pain 11/10/2014  . Acne 05/12/2014  . Diabetes mellitus type 2, controlled ([place]) 05/12/2014  . GERD (gastroesophageal reflux disease) 03/10/2014  . Generalized anxiety disorder 03/10/2014  . Migraines 03/10/2014  . EUSTACHIAN TUBE DYSFUNCTION, CHRONIC 06/14/2010  . Irritable bowel syndrome 12/15/2008  . OBESITY, MORBID 10/12/2008  . Low back pain 08/11/2008  . Hyperlipidemia 02/09/2008  . Allergic rhinitis 02/12/2007  . ASTHMA 02/12/2007    Maryelizabeth Kaufmann, SPTA 01/13/2018, 2:16 PM  [place], [place], [address] Phone: [phone]   Fax:  [phone]  Name: [name] MRN: [id number] Date of Birth: 1951/03/17

## 2018-01-15 ENCOUNTER — Ambulatory Visit: Payer: Medicare Other | Admitting: Physical Therapy

## 2018-01-15 DIAGNOSIS — M25561 Pain in right knee: Secondary | ICD-10-CM

## 2018-01-15 DIAGNOSIS — R262 Difficulty in walking, not elsewhere classified: Secondary | ICD-10-CM

## 2018-01-15 DIAGNOSIS — M25562 Pain in left knee: Secondary | ICD-10-CM

## 2018-01-15 NOTE — Therapy (Addendum)
[place], [place], [address] Phone: [phone]   Fax:  [phone]  Physical Therapy Treatment Progress Note Reporting Period 12/09/2017 to 01/15/18 visit 10-20  See note below for Objective Data and Assessment of Progress/Goals.      Patient Details  Name: [name] MRN: [id number] Date of Birth: May 18, 1951 Referring Provider: T. Percell Miller   Encounter Date: 01/15/2018  PT End of Session - 01/15/18 1530    Visit Number  20    Date for PT Re-Evaluation  02/06/18    PT Start Time  0245    PT Stop Time  1545    PT Time Calculation (min)  780 min       Past Medical History:  Diagnosis Date  . Allergy   . Anxiety   . Arthritis    back & knee  . Asthma     mild per pt shows up with resp illness  . Chronic diastolic congestive heart failure ([place])   . Colon polyps   . Diabetes mellitus without complication ([place])   . Gastric polyps   . Gastroparesis   . GERD (gastroesophageal reflux disease)   . Headache    sinus headaches and migraines at times  . Heart murmur   . History of migraine headaches   . Hyperlipidemia   . IBS (irritable bowel syndrome)   . Lumbar disc disease   . S/P aortic valve replacement with bioprosthetic valve 08/23/2016   25 mm Edwards Intuity rapid-deployment bovine pericardial tissue valve via partial upper mini sternotomy  . Sleep apnea     Past Surgical History:  Procedure Laterality Date  . ABDOMINAL HYSTERECTOMY    . AORTIC VALVE REPLACEMENT N/A 08/23/2016   Procedure: AORTIC VALVE REPLACEMENT (AVR) - using partial Upper Sternotomy- 37mm Edwards Intuity Aortic Valve used;  Surgeon: Rexene Alberts, MD;  Location: [place];  Service: Open Heart Surgery;  Laterality: N/A;  . BLADDER SUSPENSION  2007  . BREAST ENHANCEMENT SURGERY  1980  . CARPAL TUNNEL RELEASE Bilateral   . COLONOSCOPY    . DORSAL COMPARTMENT RELEASE Right 09/15/2014   Procedure: RIGHT WRIST  DEQUERVAINS RELEASE ;  Surgeon: Kathryne Hitch, MD;  Location: [place];  Service: Orthopedics;  Laterality: Right;  . ESOPHAGOGASTRODUODENOSCOPY    . KNEE ARTHROSCOPY  04/15/2012   Procedure: ARTHROSCOPY KNEE;  Surgeon: [name], MD;  Location: [place];  Service: Orthopedics;  Laterality: Right;  . LAPAROSCOPIC CHOLECYSTECTOMY    . LASIK    . OOPHORECTOMY    . PALATE TO GINGIVA GRAFT  2017  . RIGHT/LEFT HEART CATH AND CORONARY ANGIOGRAPHY N/A 08/02/2016   Procedure: Right/Left Heart Cath and Coronary Angiography;  Surgeon: Sherren Mocha, MD;  Location: [place] [facility];  Service: Cardiovascular;  Laterality: N/A;  . ROTATOR CUFF REPAIR Left   . TEE WITHOUT CARDIOVERSION N/A 08/23/2016   Procedure: TRANSESOPHAGEAL ECHOCARDIOGRAM (TEE);  Surgeon: Rexene Alberts, MD;  Location: [place];  Service: Open Heart Surgery;  Laterality: N/A;  . TOTAL KNEE ARTHROPLASTY  04/15/2012   Procedure: TOTAL KNEE ARTHROPLASTY;  Surgeon: [name], MD;  Location: [place];  Service: Orthopedics;  Laterality: Right;  . TRIGGER FINGER RELEASE Right 04/20/2015   Procedure: RIGHT TRIGGER FINGER RELEASE (TENDON SHEALTH INCISION) ;  Surgeon: [name], MD;  Location: [place];  Service: Orthopedics;  Laterality: Right;    There were no vitals filed for  this visit.  Subjective Assessment - 01/15/18 1457    Subjective  Pt reports having a good day and feeling alright. Pt reported feeling good after last treatment.     Currently in Pain?  No/denies                       [facility] Adult PT Treatment/Exercise - 01/15/18 0001      Knee/Hip Exercises: Aerobic   Nustep  Level 5 x 47minutes      Knee/Hip Exercises: Machines for Strengthening   Cybex Knee Extension  15# 3x10    Cybex Knee Flexion  25# 3x10    Cybex Leg Press  40# 3x15      Knee/Hip Exercises: Standing   Lateral Step Up  2 sets;5 sets;Both    Forward Step Up  Left;2 sets;10 reps;Hand Hold:  1;Step Height: 8"    Walking with Sports Cord  all directions 30lb x 5 ea      Knee/Hip Exercises: Sidelying   Hip ABduction  Left;2 sets;10 reps    Hip ADduction  Left;2 sets;10 reps      Modalities   Modalities  Electrical Stimulation;Moist Heat      Moist Heat Therapy   Number Minutes Moist Heat  15 Minutes    Moist Heat Location  Hip      Cryotherapy   Number Minutes Cryotherapy  15 Minutes    Cryotherapy Location  Knee    Type of Cryotherapy  Ice pack      Electrical Stimulation   Electrical Stimulation Location  L ITB    Electrical Stimulation Action  IFC    Electrical Stimulation Parameters  sidelying    Electrical Stimulation Goals  Pain               PT Short Term Goals - 11/20/17 1530      PT SHORT TERM GOAL #1   Title  independent with intial HEP    Status  Achieved        PT Long Term Goals - 01/15/18 1457      PT LONG TERM GOAL #1   Title  decrease pain 50%    Status  Achieved      PT LONG TERM GOAL #2   Title  returns safely to the gym    Baseline  discussed safe gym return and ex plus addition of abd/add machine, said membership and is frozen     Status  On-going      PT LONG TERM GOAL #3   Title  go up and down steps without difficulty    Baseline  still has hip pain            Plan - 01/15/18 1501    Clinical Impression Statement  Pt reports she is doing great today and overall. Only has some pain during stairs or if she turns a certain way with hip. Pt reported still not having gotten back to a regular gym schedule though she does have a membership. Pt tolerated an increase step height to closer simulate step height at home.    Rehab Potential  Good    PT Frequency  2x / week    PT Duration  8 weeks    PT Treatment/Interventions  ADLs/Self Care Home Management;Cryotherapy;Electrical Stimulation;Iontophoresis 4mg /ml Dexamethasone;Moist Heat;Gait training;Neuromuscular re-education;Balance training;Therapeutic exercise;Therapeutic  activities;Functional mobility training;Stair training;Patient/family education;Manual techniques    PT Next Visit Plan  We will continue to work on her flexibility the ITB  tightness, need to add some core strength and look to D/C in the next month       Patient will benefit from skilled therapeutic intervention in order to improve the following deficits and impairments:  Abnormal gait, Decreased range of motion, Difficulty walking, Decreased endurance, Decreased activity tolerance, Pain, Impaired flexibility, Decreased strength, Decreased mobility  Visit Diagnosis: Acute pain of right knee  Acute pain of left knee  Difficulty in walking, not elsewhere classified     Problem List Patient Active Problem List   Diagnosis Date Noted  . History of CVA in adulthood 08/13/2017  . Blurry vision 08/13/2017  . Hypertension 01/02/2017  . S/P minimally invasive aortic valve replacement with bioprosthetic valve 08/23/2016  . Chronic diastolic congestive heart failure ([place])   . Osteopenia 05/27/2016  . OSA (obstructive sleep apnea) 02/27/2016  . Bruxism 10/23/2015  . Elevated alkaline phosphatase level 05/16/2015  . Atypical chest pain 11/10/2014  . Acne 05/12/2014  . Diabetes mellitus type 2, controlled ([place]) 05/12/2014  . GERD (gastroesophageal reflux disease) 03/10/2014  . Generalized anxiety disorder 03/10/2014  . Migraines 03/10/2014  . EUSTACHIAN TUBE DYSFUNCTION, CHRONIC 06/14/2010  . Irritable bowel syndrome 12/15/2008  . OBESITY, MORBID 10/12/2008  . Low back pain 08/11/2008  . Hyperlipidemia 02/09/2008  . Allergic rhinitis 02/12/2007  . ASTHMA 02/12/2007    Maryelizabeth Kaufmann, SPTA 01/15/2018, 3:32 PM  [place], [place], [address] Phone: [phone]   Fax:  [phone]  Name: ELYANNA WALLICK MRN: [id number] Date of Birth: [birth year]

## 2018-01-15 NOTE — Progress Notes (Signed)
Subjective:   [name] is a 67 y.o. female who presents for Medicare Annual (Subsequent) preventive examination.  Reports health as Seeing Dr. Tomi Likens;  OV with Dr. Yong Channel 11/25/2017  AVR 08/2016  Dr. Yong Channel found heart murmur  BP 158 60  Had sodium in salad at the [place] Has not taken lasix today  Seeing Dr. Leafy Ro on Monday  Does not check her BP at home   Diet LDL 58 A1c elevated 6.2 from 6.4  She counts carbs Tried to cut back on portion size  Spouse ate lunch out everyday    Exercise Right now, has been going to PT  Trying to get her IT band better Slipped on a rug and slipped to her knees  Hopes to go back to the gym next month Uses the new step for cardio x 30 minutes    There are no preventive care reminders to display for this patient.  Diabetic eye exam 03/2017  Colonoscopy 07/2014 - has repeat 1 0 years Mammogram 02/2017 goes once a year  Bone density 04/2016 -2.0   Zoster 04/2014; educated regarding shingrix   Cardiac Risk Factors include: advanced age (>30men, >86 women);diabetes mellitus;family history of premature cardiovascular disease;obesity (BMI >30kg/m2)     Objective:     Vitals: BP (!) 158/60   Pulse 74   Ht 5\' 6"  (1.676 m)   Wt 231 lb 4 oz (104.9 kg)   SpO2 100%   BMI 37.32 kg/m   Body mass index is 37.32 kg/m.  Advanced Directives 11/05/2017 08/23/2016 08/21/2016 08/02/2016 04/20/2015 04/19/2015 09/15/2014  Does Patient Have a Medical Advance Directive? No Yes Yes Yes Yes Yes Yes  Type of Advance Directive - Living will - - Healthcare Power of [place] will  Does patient want to make changes to medical advance directive? - No - Patient declined No - Patient declined - No - Patient declined - No - Patient declined  Copy of [place] in Chart? - - - - No - copy requested - No - copy requested  Would patient like information on creating a medical advance directive? No -  Patient declined - - - - - No - patient declined information  Pre-existing out of facility DNR order (yellow form or pink MOST form) - - - - - - -    Tobacco Social History   Tobacco Use  Smoking Status Never Smoker  Smokeless Tobacco Never Used     Counseling given: Yes   Clinical Intake:     Past Medical History:  Diagnosis Date  . Allergy   . Anxiety   . Arthritis    back & knee  . Asthma     mild per pt shows up with resp illness  . Chronic diastolic congestive heart failure ([place])   . Colon polyps   . Diabetes mellitus without complication ([place])   . Gastric polyps   . Gastroparesis   . GERD (gastroesophageal reflux disease)   . Headache    sinus headaches and migraines at times  . Heart murmur   . History of migraine headaches   . Hyperlipidemia   . IBS (irritable bowel syndrome)   . Lumbar disc disease   . S/P aortic valve replacement with bioprosthetic valve 08/23/2016   25 mm Edwards Intuity rapid-deployment bovine pericardial tissue valve via partial upper mini sternotomy  . Sleep apnea    Past Surgical History:  Procedure Laterality Date  .  ABDOMINAL HYSTERECTOMY    . AORTIC VALVE REPLACEMENT N/A 08/23/2016   Procedure: AORTIC VALVE REPLACEMENT (AVR) - using partial Upper Sternotomy- 59mm Edwards Intuity Aortic Valve used;  Surgeon: Rexene Alberts, MD;  Location: [facility];  Service: Open Heart Surgery;  Laterality: N/A;  . BLADDER SUSPENSION  2007  . BREAST ENHANCEMENT SURGERY  1980  . CARPAL TUNNEL RELEASE Bilateral   . COLONOSCOPY    . DORSAL COMPARTMENT RELEASE Right 09/15/2014   Procedure: RIGHT WRIST DEQUERVAINS RELEASE ;  Surgeon: Kathryne Hitch, MD;  Location: [facility];  Service: Orthopedics;  Laterality: Right;  . ESOPHAGOGASTRODUODENOSCOPY    . KNEE ARTHROSCOPY  04/15/2012   Procedure: ARTHROSCOPY KNEE;  Surgeon: [name], MD;  Location: [facility];  Service: Orthopedics;  Laterality: Right;  . LAPAROSCOPIC CHOLECYSTECTOMY    .  LASIK    . OOPHORECTOMY    . PALATE TO GINGIVA GRAFT  2017  . RIGHT/LEFT HEART CATH AND CORONARY ANGIOGRAPHY N/A 08/02/2016   Procedure: Right/Left Heart Cath and Coronary Angiography;  Surgeon: Sherren Mocha, MD;  Location: [facility];  Service: Cardiovascular;  Laterality: N/A;  . ROTATOR CUFF REPAIR Left   . TEE WITHOUT CARDIOVERSION N/A 08/23/2016   Procedure: TRANSESOPHAGEAL ECHOCARDIOGRAM (TEE);  Surgeon: Rexene Alberts, MD;  Location: [facility];  Service: Open Heart Surgery;  Laterality: N/A;  . TOTAL KNEE ARTHROPLASTY  04/15/2012   Procedure: TOTAL KNEE ARTHROPLASTY;  Surgeon: [name], MD;  Location: [facility];  Service: Orthopedics;  Laterality: Right;  . TRIGGER FINGER RELEASE Right 04/20/2015   Procedure: RIGHT TRIGGER FINGER RELEASE (TENDON SHEALTH INCISION) ;  Surgeon: [name], MD;  Location: [facility];  Service: Orthopedics;  Laterality: Right;   Family History  Problem Relation Age of Onset  . COPD Mother   . Colon polyps Mother   . Irritable bowel syndrome Mother   . Heart disease Father   . Alcohol abuse Father   . Colon polyps Maternal Aunt    Father's side had brothers with heart disease  Social History   Socioeconomic History  . Marital status: Married    Spouse name: Not on file  . Number of children: 1  . Years of education: Not on file  . Highest education level: Not on file  Occupational History  . Occupation: Retired    Fish farm manager: RETIRED  Social Needs  . Financial resource strain: Not on file  . Food insecurity:    Worry: Not on file    Inability: Not on file  . Transportation needs:    Medical: Not on file    Non-medical: Not on file  Tobacco Use  . Smoking status: Never Smoker  . Smokeless tobacco: Never Used  Substance and Sexual Activity  . Alcohol use: No  . Drug use: No  . Sexual activity: Yes  Lifestyle  . Physical activity:    Days per week: Not on file    Minutes per session: Not on file  . Stress:  Not on file  Relationships  . Social connections:    Talks on phone: Not on file    Gets together: Not on file    Attends religious service: Not on file    Active member of club or organization: Not on file    Attends meetings of clubs or organizations: Not on file    Relationship status: Not on file  Other Topics Concern  . Not on file  Social History Narrative  She lives with husband (1978) and two dogs. Step-son [name] (1971-psychiatrist in [place]). No grandkids. 2 dogs-sheltie/collie mix and border collie mix      Highest level of education:  Master in education   She is retired Animal nutritionist x 32 years.      Hobbies: time with dogs             Outpatient Encounter Medications as of 01/16/2018  Medication Sig  . atorvastatin (LIPITOR) 40 MG tablet Take 1 tablet (40 mg total) by mouth daily at 6 PM.  . Biotin 5000 MCG TABS Take 5,000 mcg by mouth daily.   . calcium-vitamin D (OSCAL WITH D) 500-200 MG-UNIT per tablet Take 2 tablets by mouth daily.   . clindamycin (CLEOCIN) 300 MG capsule TAKE 2 CAPSULES BY MOUTH 1 HOUR PRIOR TOO DENTAL CLEANING.  [name] clopidogrel (PLAVIX) 75 MG tablet Take 1 tablet (75 mg total) by mouth daily.  . fluticasone (FLONASE) 50 MCG/ACT nasal spray Place 2 sprays into both nostrils daily.   . furosemide (LASIX) 40 MG tablet Take 1 tablet (40 mg total) by mouth daily.  [name] glucose blood (ONETOUCH VERIO) test strip Use to test blood sugars daily. Dx: E11.9  . Lancets (ONETOUCH ULTRASOFT) lancets Use to test blood sugars daily. Dx: E11.9  . loratadine (CLARITIN) 10 MG tablet Take 10 mg by mouth at bedtime.   . metFORMIN (GLUCOPHAGE) 500 MG tablet TAKE 1 TABLET WITH BREAKFAST AND 1/2 TO 1 TABLET WITH DINNER.  [name] metoCLOPramide (REGLAN) 10 MG tablet TAKE 1/2 TABLET BY MOUTH 4 TIMES A DAY  . metoprolol tartrate (LOPRESSOR) 25 MG tablet Take 0.5 tablets (12.5 mg total) by mouth 2 (two) times daily.  . Multiple Vitamin (MULTIVITAMIN WITH MINERALS) TABS  tablet Take 1 tablet by mouth daily.  . naproxen sodium (ALEVE) 220 MG tablet Take 220 mg by mouth.  [name] omeprazole (PRILOSEC OTC) 20 MG tablet Take 40 mg by mouth daily with breakfast.  . potassium chloride SA (K-DUR,KLOR-CON) 20 MEQ tablet Take 1 tablet (20 mEq total) by mouth daily.  . Probiotic Product (ALIGN) 4 MG CAPS Take 4 mg by mouth daily.   . psyllium (METAMUCIL SMOOTH TEXTURE) 28 % packet Take 1 packet by mouth daily before breakfast.   . venlafaxine XR (EFFEXOR-XR) 150 MG 24 hr capsule TAKE ONE CAPSULE BY MOUTH DAILY WITH BREAKFAST   No facility-administered encounter medications on file as of 01/16/2018.     Activities of Daily Living In your present state of health, do you have any difficulty performing the following activities: 01/16/2018  Hearing? N  Vision? N  Difficulty concentrating or making decisions? N  Walking or climbing stairs? Y  Comment still working wiht left knee after fall; home is one level; goes back to the ortho in august 7 or 8th  Dressing or bathing? N  Doing errands, shopping? N  Preparing Food and eating ? N  Using the Toilet? N  In the past six months, have you accidently leaked urine? N  Do you have problems with loss of bowel control? N  Managing your Medications? N  Managing your Finances? N  Housekeeping or managing your Housekeeping? N  Some recent data might be hidden    Patient Care Team: [name], MD as PCP - General (Family Medicine) Dorothy Spark, MD as PCP - Cardiology (Cardiology) Olga Millers, MD as Consulting Physician (Obstetrics and Gynecology)    Assessment:   This is a routine wellness examination for  Vallery.  Exercise Activities and Dietary recommendations Current Exercise Habits: Home exercise routine, Type of exercise: strength training/weights;walking;Other - see comments, Time (Minutes): 60, Frequency (Times/Week): 3(goal ), Weekly Exercise (Minutes/Week): 180, Intensity: Moderate  Goals    .  exercise     Hope to get back to the gym when able  [facility] gym routine  Seeing Dr. Leafy Ro        Fall Risk Fall Risk  01/16/2018 12/24/2017 11/25/2017 10/22/2016 09/23/2016  Falls in the past year? Yes No Yes No No  Comment - - tripped on a rug - -  Number falls in past yr: - - 1 - -  Injury with Fall? Yes - No - -  Risk for fall due to : Impaired balance/gait;Impaired mobility - - Other (Comment) -  Risk for fall due to: Comment hurt her knee - - Single leg stand less than 5 seconds. -    Depression Screen PHQ 2/9 Scores 01/16/2018 11/25/2017 01/09/2017 10/28/2016  PHQ - 2 Score 0 0 0 0     Cognitive Function MMSE - Mini Mental State Exam 01/16/2018  Not completed: (No Data)   Ad8 score reviewed for issues:  Issues making decisions:  Less interest in hobbies / activities:  Repeats questions, stories (family complaining):  Trouble using ordinary gadgets (microwave, computer, phone):  Forgets the month or year:   Mismanaging finances:   Remembering appts:  Daily problems with thinking and/or memory: Ad8 score is=0          Immunization History  Administered Date(s) Administered  . Influenza Split 04/04/2011, 03/05/2012  . Influenza Whole 04/16/2007, 03/16/2009, 03/13/2010  . Influenza,inj,Quad PF,6+ Mos 03/30/2013, 03/10/2014, 03/23/2015, 02/27/2016  . Influenza-Unspecified 02/27/2017, 02/28/2017  . Pneumococcal Conjugate-13 05/21/2016  . Pneumococcal Polysaccharide-23 05/23/2017  . Td 06/24/1996, 08/06/2007, 11/07/2017  . Zoster 05/12/2014      Screening Tests Health Maintenance  Topic Date Due  . INFLUENZA VACCINE  01/22/2018  . OPHTHALMOLOGY EXAM  03/31/2018  . FOOT EXAM  05/23/2018  . URINE MICROALBUMIN  05/23/2018  . HEMOGLOBIN A1C  05/27/2018  . MAMMOGRAM  03/20/2019  . COLONOSCOPY  07/28/2024  . TETANUS/TDAP  11/08/2027  . DEXA SCAN  Completed  . Hepatitis C Screening  Completed  . PNA vac Low Risk Adult  Completed        Plan:      PCP  Notes   Health Maintenance Diabetic eye exam 03/2017  Colonoscopy 07/2014 - has repeat 1 0 years Mammogram 02/2017 goes once a year  Bone density 04/2016 -2.0  [name] ordered your bone density to be done 12/1 through the  12/9 or  Before you apt with Dr. Yong Channel on 12/12.    Zoster 04/2014; educated regarding shingrix  Is on a waiting list now   To see Dr. Leafy Ro on Monday for diet plan, labs and fup  ( she can recheck BP)    Abnormal Screens  BP was elevated most likely secondary to having olive garden salad for lunch with salmon.  Did not want to stay an hour to recheck but will make an   apt for 2 weeks to see Dr. Yong Channel to recheck.  Will also have Dr. Leafy Ro review on Monday. Would try to get your bp checked a few times a week until you see Dr Yong Channel in 2 weeks. You can use a cuff at the pharmacy or have the therapist check your pressure before your last 2 treatments   Referrals  none  Patient concerns;  None   Nurse Concerns; BP elevated as noted   Next PCP apt 12/12 and will schedule for 2 weeks  [name] will fup to be sure she schedules      I have personally reviewed and noted the following in the patient's chart:   . Medical and social history . Use of alcohol, tobacco or illicit drugs  . Current medications and supplements . Functional ability and status . Nutritional status . Physical activity . Advanced directives . List of other physicians . Hospitalizations, surgeries, and ER visits in previous 12 months . Vitals . Screenings to include cognitive, depression, and falls . Referrals and appointments  In addition, I have reviewed and discussed with patient certain preventive protocols, quality metrics, and best practice recommendations. A written personalized care plan for preventive services as well as general preventive health recommendations were provided to patient.     Wynetta Fines, RN  01/16/2018

## 2018-01-16 ENCOUNTER — Telehealth: Payer: Self-pay

## 2018-01-16 ENCOUNTER — Ambulatory Visit (INDEPENDENT_AMBULATORY_CARE_PROVIDER_SITE_OTHER): Payer: Medicare Other

## 2018-01-16 VITALS — BP 152/50 | HR 74 | Ht 66.0 in | Wt 231.2 lb

## 2018-01-16 DIAGNOSIS — Z0000 Encounter for general adult medical examination without abnormal findings: Secondary | ICD-10-CM

## 2018-01-16 DIAGNOSIS — M8580 Other specified disorders of bone density and structure, unspecified site: Secondary | ICD-10-CM | POA: Diagnosis not present

## 2018-01-16 NOTE — Telephone Encounter (Signed)
AWV 07/26  Hx AVR 08/2016 BP elevated and was to schedule with Dr. Yong Channel in 2 weeks to reheck BP   Call if not rescheduled by July th

## 2018-01-16 NOTE — Progress Notes (Signed)
I have reviewed and agree with note, evaluation, plan. Glad patient has scheduled BP follow up with me.   Garret Reddish, MD

## 2018-01-16 NOTE — Patient Instructions (Addendum)
[name] , Thank you for taking time to come for your Medicare Wellness Visit. I appreciate your ongoing commitment to your health goals. Please review the following plan we discussed and let me know if I can assist you in the future.   BP elevated and will make an apt for 2 weeks to see Dr. Yong Channel to recheck. Will also have Dr. Leafy Ro review on Monday. Would try to get your bp checked a few times a week until you see Dr Yong Channel. You can use a cuff at the pharmacy. Have the therapist check your pressure.   [name] ordered your bone density to be done 12/1 through the 9th  Before you apt with Dr. Yong Channel. You should be hearing from someone at [facility] is a vaccine for the prevention of Shingles in Adults 50 and older.  If you are on Medicare, the shingrix is covered under your Part D plan, so you will take both of the vaccines in the series at your pharmacy. Please check with your benefits regarding applicable copays or out of pocket expenses.  The Shingrix is given in 2 vaccines approx 8 weeks apart. You must receive the 2nd dose prior to 6 months from receipt of the first. Please have the pharmacist print out you Immunization  dates for our office records    Recommendations for Dexa Scan Female over the age of 34 Man age 8 or older If you broke a bone past the age of 8 Women menopausal age with risk factors (thin frame; smoker; hx of fx ) Post menopausal women under the age of 9 with risk factors A man age 74 to 54 with risk factors Other: Spine xray that is showing break of bone loss Back pain with possible break Height loss of 1/2 inch or more within one year Total loss in height of 1.5 inches from your original height  Calcium '1200mg'$  with Vit D 800u per day; more as directed by physician Strength building exercises discussed; can include walking; housework; small weights or stretch bands; silver sneakers if [facility] to the [facility]  Please visit the osteoporosis foundation.org  for up to date recommendations    These are the goals we discussed: Goals    . exercise     Hope to get back to the gym when able  [name] gym routine  Seeing Dr. Leafy Ro        This is a list of the screening recommended for you and due dates:  Health Maintenance  Topic Date Due  . Flu Shot  01/22/2018  . Eye exam for diabetics  03/31/2018  . Complete foot exam   05/23/2018  . Urine Protein Check  05/23/2018  . Hemoglobin A1C  05/27/2018  . Mammogram  03/20/2019  . Colon Cancer Screening  07/28/2024  . Tetanus Vaccine  11/08/2027  . DEXA scan (bone density measurement)  Completed  .  Hepatitis C: One time screening is recommended by Center for Disease Control  (CDC) for  adults born from [birth year] through 1965.   Completed  . Pneumonia vaccines  Completed     DASH Eating Plan DASH stands for "Dietary Approaches to Stop Hypertension." The DASH eating plan is a healthy eating plan that has been shown to reduce high blood pressure (hypertension). It may also reduce your risk for type 2 diabetes, heart disease, and stroke. The DASH eating plan may also help with weight loss. What are tips for following this plan? General guidelines  Avoid eating  more than 2,300 mg (milligrams) of salt (sodium) a day. If you have hypertension, you may need to reduce your sodium intake to 1,500 mg a day.  Limit alcohol intake to no more than 1 drink a day for nonpregnant women and 2 drinks a day for men. One drink equals 12 oz of beer, 5 oz of wine, or 1 oz of hard liquor.  Work with your health care provider to maintain a healthy body weight or to lose weight. Ask what an ideal weight is for you.  Get at least 30 minutes of exercise that causes your heart to beat faster (aerobic exercise) most days of the week. Activities may include walking, swimming, or biking.  Work with your health care provider or diet and nutrition specialist (dietitian) to adjust your eating plan to your individual calorie  needs. Reading food labels  Check food labels for the amount of sodium per serving. Choose foods with less than 5 percent of the Daily Value of sodium. Generally, foods with less than 300 mg of sodium per serving fit into this eating plan.  To find whole grains, look for the word "whole" as the first word in the ingredient list. Shopping  Buy products labeled as "low-sodium" or "no salt added."  Buy fresh foods. Avoid canned foods and premade or frozen meals. Cooking  Avoid adding salt when cooking. Use salt-free seasonings or herbs instead of table salt or sea salt. Check with your health care provider or pharmacist before using salt substitutes.  Do not fry foods. Cook foods using healthy methods such as baking, boiling, grilling, and broiling instead.  Cook with heart-healthy oils, such as olive, canola, soybean, or sunflower oil. Meal planning   Eat a balanced diet that includes: ? 5 or more servings of fruits and vegetables each day. At each meal, try to fill half of your plate with fruits and vegetables. ? Up to 6-8 servings of whole grains each day. ? Less than 6 oz of lean meat, poultry, or fish each day. A 3-oz serving of meat is about the same size as a deck of cards. One egg equals 1 oz. ? 2 servings of low-fat dairy each day. ? A serving of nuts, seeds, or beans 5 times each week. ? Heart-healthy fats. Healthy fats called Omega-3 fatty acids are found in foods such as flaxseeds and coldwater fish, like sardines, salmon, and mackerel.  Limit how much you eat of the following: ? Canned or prepackaged foods. ? Food that is high in trans fat, such as fried foods. ? Food that is high in saturated fat, such as fatty meat. ? Sweets, desserts, sugary drinks, and other foods with added sugar. ? Full-fat dairy products.  Do not salt foods before eating.  Try to eat at least 2 vegetarian meals each week.  Eat more home-cooked food and less restaurant, buffet, and fast  food.  When eating at a restaurant, ask that your food be prepared with less salt or no salt, if possible. What foods are recommended? The items listed may not be a complete list. Talk with your dietitian about what dietary choices are best for you. Grains Whole-grain or whole-wheat bread. Whole-grain or whole-wheat pasta. Brown rice. [place]. Bulgur. Whole-grain and low-sodium cereals. Pita bread. Low-fat, low-sodium crackers. Whole-wheat flour tortillas. Vegetables Fresh or frozen vegetables (raw, steamed, roasted, or grilled). Low-sodium or reduced-sodium tomato and vegetable juice. Low-sodium or reduced-sodium tomato sauce and tomato paste. Low-sodium or reduced-sodium canned vegetables. Fruits All fresh,  dried, or frozen fruit. Canned fruit in natural juice (without added sugar). Meat and other protein foods Skinless chicken or [place]. Ground chicken or [place]. Pork with fat trimmed off. Fish and seafood. Egg whites. Dried beans, peas, or lentils. Unsalted nuts, nut butters, and seeds. Unsalted canned beans. Lean cuts of beef with fat trimmed off. Low-sodium, lean deli meat. Dairy Low-fat (1%) or fat-free (skim) milk. Fat-free, low-fat, or reduced-fat cheeses. Nonfat, low-sodium ricotta or cottage cheese. Low-fat or nonfat yogurt. Low-fat, low-sodium cheese. Fats and oils Soft margarine without trans fats. Vegetable oil. Low-fat, reduced-fat, or light mayonnaise and salad dressings (reduced-sodium). Canola, safflower, olive, soybean, and sunflower oils. Avocado. Seasoning and other foods Herbs. Spices. Seasoning mixes without salt. Unsalted popcorn and pretzels. Fat-free sweets. What foods are not recommended? The items listed may not be a complete list. Talk with your dietitian about what dietary choices are best for you. Grains Baked goods made with fat, such as croissants, muffins, or some breads. Dry pasta or rice meal packs. Vegetables Creamed or fried vegetables. Vegetables  in a cheese sauce. Regular canned vegetables (not low-sodium or reduced-sodium). Regular canned tomato sauce and paste (not low-sodium or reduced-sodium). Regular tomato and vegetable juice (not low-sodium or reduced-sodium). Angie Fava. Olives. Fruits Canned fruit in a light or heavy syrup. Fried fruit. Fruit in cream or butter sauce. Meat and other protein foods Fatty cuts of meat. Ribs. Fried meat. Berniece Salines. Sausage. Bologna and other processed lunch meats. Salami. Fatback. Hotdogs. Bratwurst. Salted nuts and seeds. Canned beans with added salt. Canned or smoked fish. Whole eggs or egg yolks. Chicken or [place] with skin. Dairy Whole or 2% milk, cream, and half-and-half. Whole or full-fat cream cheese. Whole-fat or sweetened yogurt. Full-fat cheese. Nondairy creamers. Whipped toppings. Processed cheese and cheese spreads. Fats and oils Butter. Stick margarine. Lard. Shortening. Ghee. Bacon fat. Tropical oils, such as coconut, palm kernel, or palm oil. Seasoning and other foods Salted popcorn and pretzels. Onion salt, garlic salt, seasoned salt, table salt, and sea salt. Worcestershire sauce. Tartar sauce. Barbecue sauce. Teriyaki sauce. Soy sauce, including reduced-sodium. Steak sauce. Canned and packaged gravies. Fish sauce. Oyster sauce. Cocktail sauce. Horseradish that you find on the shelf. Ketchup. Mustard. Meat flavorings and tenderizers. Bouillon cubes. Hot sauce and Tabasco sauce. Premade or packaged marinades. Premade or packaged taco seasonings. Relishes. Regular salad dressings. Where to find more information:  National Heart, Lung, and [place]: https://wilson-eaton.com/  American Heart Association: www.heart.org Summary  The DASH eating plan is a healthy eating plan that has been shown to reduce high blood pressure (hypertension). It may also reduce your risk for type 2 diabetes, heart disease, and stroke.  With the DASH eating plan, you should limit salt (sodium) intake to 2,300 mg a  day. If you have hypertension, you may need to reduce your sodium intake to 1,500 mg a day.  When on the DASH eating plan, aim to eat more fresh fruits and vegetables, whole grains, lean proteins, low-fat dairy, and heart-healthy fats.  Work with your health care provider or diet and nutrition specialist (dietitian) to adjust your eating plan to your individual calorie needs. This information is not intended to replace advice given to you by your health care provider. Make sure you discuss any questions you have with your health care provider. Document Released: 05/30/2011 Document Revised: 06/03/2016 Document Reviewed: 06/03/2016 Elsevier Interactive Patient Education  2018 [place] in the Home Falls can cause injuries. They can happen to people of  all ages. There are many things you can do to make your home safe and to help prevent falls. What can I do on the outside of my home?  Regularly fix the edges of walkways and driveways and fix any cracks.  Remove anything that might make you trip as you walk through a door, such as a raised step or threshold.  Trim any bushes or trees on the path to your home.  Use bright outdoor lighting.  Clear any walking paths of anything that might make someone trip, such as rocks or tools.  Regularly check to see if handrails are loose or broken. Make sure that both sides of any steps have handrails.  Any raised decks and porches should have guardrails on the edges.  Have any leaves, snow, or ice cleared regularly.  Use sand or salt on walking paths during winter.  Clean up any spills in your garage right away. This includes oil or grease spills. What can I do in the bathroom?  Use night lights.  Install grab bars by the toilet and in the tub and shower. Do not use towel bars as grab bars.  Use non-skid mats or decals in the tub or shower.  If you need to sit down in the shower, use a plastic, non-slip stool.  Keep  the floor dry. Clean up any water that spills on the floor as soon as it happens.  Remove soap buildup in the tub or shower regularly.  Attach bath mats securely with double-sided non-slip rug tape.  Do not have throw rugs and other things on the floor that can make you trip. What can I do in the bedroom?  Use night lights.  Make sure that you have a light by your bed that is easy to reach.  Do not use any sheets or blankets that are too big for your bed. They should not hang down onto the floor.  Have a firm chair that has side arms. You can use this for support while you get dressed.  Do not have throw rugs and other things on the floor that can make you trip. What can I do in the kitchen?  Clean up any spills right away.  Avoid walking on wet floors.  Keep items that you use a lot in easy-to-reach places.  If you need to reach something above you, use a strong step stool that has a grab bar.  Keep electrical cords out of the way.  Do not use floor polish or wax that makes floors slippery. If you must use wax, use non-skid floor wax.  Do not have throw rugs and other things on the floor that can make you trip. What can I do with my stairs?  Do not leave any items on the stairs.  Make sure that there are handrails on both sides of the stairs and use them. Fix handrails that are broken or loose. Make sure that handrails are as long as the stairways.  Check any carpeting to make sure that it is firmly attached to the stairs. Fix any carpet that is loose or worn.  Avoid having throw rugs at the top or bottom of the stairs. If you do have throw rugs, attach them to the floor with carpet tape.  Make sure that you have a light switch at the top of the stairs and the bottom of the stairs. If you do not have them, ask someone to add them for you. What else can I do  to help prevent falls?  Wear shoes that: ? Do not have high heels. ? Have rubber bottoms. ? Are comfortable and  fit you well. ? Are closed at the toe. Do not wear sandals.  If you use a stepladder: ? Make sure that it is fully opened. Do not climb a closed stepladder. ? Make sure that both sides of the stepladder are locked into place. ? Ask someone to hold it for you, if possible.  Clearly mark and make sure that you can see: ? Any grab bars or handrails. ? First and last steps. ? Where the edge of each step is.  Use tools that help you move around (mobility aids) if they are needed. These include: ? Canes. ? Walkers. ? Scooters. ? Crutches.  Turn on the lights when you go into a dark area. Replace any light bulbs as soon as they burn out.  Set up your furniture so you have a clear path. Avoid moving your furniture around.  If any of your floors are uneven, fix them.  If there are any pets around you, be aware of where they are.  Review your medicines with your doctor. Some medicines can make you feel dizzy. This can increase your chance of falling. Ask your doctor what other things that you can do to help prevent falls. This information is not intended to replace advice given to you by your health care provider. Make sure you discuss any questions you have with your health care provider. Document Released: 04/06/2009 Document Revised: 11/16/2015 Document Reviewed: 07/15/2014 Elsevier Interactive Patient Education  2018 [place] Maintenance, Female Adopting a healthy lifestyle and getting preventive care can go a long way to promote health and wellness. Talk with your health care provider about what schedule of regular examinations is right for you. This is a good chance for you to check in with your provider about disease prevention and staying healthy. In between checkups, there are plenty of things you can do on your own. Experts have done a lot of research about which lifestyle changes and preventive measures are most likely to keep you healthy. Ask your health care  provider for more information. Weight and diet Eat a healthy diet  Be sure to include plenty of vegetables, fruits, low-fat dairy products, and lean protein.  Do not eat a lot of foods high in solid fats, added sugars, or salt.  Get regular exercise. This is one of the most important things you can do for your health. ? Most adults should exercise for at least 150 minutes each week. The exercise should increase your heart rate and make you sweat (moderate-intensity exercise). ? Most adults should also do strengthening exercises at least twice a week. This is in addition to the moderate-intensity exercise.  Maintain a healthy weight  Body mass index (BMI) is a measurement that can be used to identify possible weight problems. It estimates body fat based on height and weight. Your health care provider can help determine your BMI and help you achieve or maintain a healthy weight.  For females 16 years of age and older: ? A BMI below 18.5 is considered underweight. ? A BMI of 18.5 to 24.9 is normal. ? A BMI of 25 to 29.9 is considered overweight. ? A BMI of 30 and above is considered obese.  Watch levels of cholesterol and blood lipids  You should start having your blood tested for lipids and cholesterol at 67 years of age, then have  this test every 5 years.  You may need to have your cholesterol levels checked more often if: ? Your lipid or cholesterol levels are high. ? You are older than 67 years of age. ? You are at high risk for heart disease.  Cancer screening Lung Cancer  Lung cancer screening is recommended for adults 64-72 years old who are at high risk for lung cancer because of a history of smoking.  A yearly low-dose CT scan of the lungs is recommended for people who: ? Currently smoke. ? Have quit within the past 15 years. ? Have at least a 30-pack-year history of smoking. A pack year is smoking an average of one pack of cigarettes a day for 1 year.  Yearly screening  should continue until it has been 15 years since you quit.  Yearly screening should stop if you develop a health problem that would prevent you from having lung cancer treatment.  Breast Cancer  Practice breast self-awareness. This means understanding how your breasts normally appear and feel.  It also means doing regular breast self-exams. Let your health care provider know about any changes, no matter how small.  If you are in your 20s or 30s, you should have a clinical breast exam (CBE) by a health care provider every 1-3 years as part of a regular health exam.  If you are 75 or older, have a CBE every year. Also consider having a breast X-ray (mammogram) every year.  If you have a family history of breast cancer, talk to your health care provider about genetic screening.  If you are at high risk for breast cancer, talk to your health care provider about having an MRI and a mammogram every year.  Breast cancer gene (BRCA) assessment is recommended for women who have family members with BRCA-related cancers. BRCA-related cancers include: ? Breast. ? Ovarian. ? Tubal. ? Peritoneal cancers.  Results of the assessment will determine the need for genetic counseling and BRCA1 and BRCA2 testing.  Cervical Cancer Your health care provider may recommend that you be screened regularly for cancer of the pelvic organs (ovaries, uterus, and vagina). This screening involves a pelvic examination, including checking for microscopic changes to the surface of your cervix (Pap test). You may be encouraged to have this screening done every 3 years, beginning at age 61.  For women ages 70-65, health care providers may recommend pelvic exams and Pap testing every 3 years, or they may recommend the Pap and pelvic exam, combined with testing for human papilloma virus (HPV), every 5 years. Some types of HPV increase your risk of cervical cancer. Testing for HPV may also be done on women of any age with  unclear Pap test results.  Other health care providers may not recommend any screening for nonpregnant women who are considered low risk for pelvic cancer and who do not have symptoms. Ask your health care provider if a screening pelvic exam is right for you.  If you have had past treatment for cervical cancer or a condition that could lead to cancer, you need Pap tests and screening for cancer for at least 20 years after your treatment. If Pap tests have been discontinued, your risk factors (such as having a new sexual partner) need to be reassessed to determine if screening should resume. Some women have medical problems that increase the chance of getting cervical cancer. In these cases, your health care provider may recommend more frequent screening and Pap tests.  Colorectal Cancer  This  type of cancer can be detected and often prevented.  Routine colorectal cancer screening usually begins at 67 years of age and continues through 67 years of age.  Your health care provider may recommend screening at an earlier age if you have risk factors for colon cancer.  Your health care provider may also recommend using home test kits to check for hidden blood in the stool.  A small camera at the end of a tube can be used to examine your colon directly (sigmoidoscopy or colonoscopy). This is done to check for the earliest forms of colorectal cancer.  Routine screening usually begins at age 57.  Direct examination of the colon should be repeated every 5-10 years through 67 years of age. However, you may need to be screened more often if early forms of precancerous polyps or small growths are found.  Skin Cancer  Check your skin from head to toe regularly.  Tell your health care provider about any new moles or changes in moles, especially if there is a change in a mole's shape or color.  Also tell your health care provider if you have a mole that is larger than the size of a pencil  eraser.  Always use sunscreen. Apply sunscreen liberally and repeatedly throughout the day.  Protect yourself by wearing long sleeves, pants, a wide-brimmed hat, and sunglasses whenever you are outside.  Heart disease, diabetes, and high blood pressure  High blood pressure causes heart disease and increases the risk of stroke. High blood pressure is more likely to develop in: ? People who have blood pressure in the high end of the normal range (130-139/85-89 mm Hg). ? People who are overweight or obese. ? People who are African American.  If you are 29-28 years of age, have your blood pressure checked every 3-5 years. If you are 96 years of age or older, have your blood pressure checked every year. You should have your blood pressure measured twice-once when you are at a hospital or clinic, and once when you are not at a hospital or clinic. Record the average of the two measurements. To check your blood pressure when you are not at a hospital or clinic, you can use: ? An automated blood pressure machine at a pharmacy. ? A home blood pressure monitor.  If you are between 69 years and 2 years old, ask your health care provider if you should take aspirin to prevent strokes.  Have regular diabetes screenings. This involves taking a blood sample to check your fasting blood sugar level. ? If you are at a normal weight and have a low risk for diabetes, have this test once every three years after 67 years of age. ? If you are overweight and have a high risk for diabetes, consider being tested at a younger age or more often. Preventing infection Hepatitis B  If you have a higher risk for hepatitis B, you should be screened for this virus. You are considered at high risk for hepatitis B if: ? You were born in a country where hepatitis B is common. Ask your health care provider which countries are considered high risk. ? Your parents were born in a high-risk country, and you have not been immunized  against hepatitis B (hepatitis B vaccine). ? You have HIV or AIDS. ? You use needles to inject street drugs. ? You live with someone who has hepatitis B. ? You have had sex with someone who has hepatitis B. ? You get hemodialysis treatment. ?  You take certain medicines for conditions, including cancer, organ transplantation, and autoimmune conditions.  Hepatitis C  Blood testing is recommended for: ? Everyone born from [birth year] through 1965. ? Anyone with known risk factors for hepatitis C.  Sexually transmitted infections (STIs)  You should be screened for sexually transmitted infections (STIs) including gonorrhea and chlamydia if: ? You are sexually active and are younger than 67 years of age. ? You are older than 67 years of age and your health care provider tells you that you are at risk for this type of infection. ? Your sexual activity has changed since you were last screened and you are at an increased risk for chlamydia or gonorrhea. Ask your health care provider if you are at risk.  If you do not have HIV, but are at risk, it may be recommended that you take a prescription medicine daily to prevent HIV infection. This is called pre-exposure prophylaxis (PrEP). You are considered at risk if: ? You are sexually active and do not regularly use condoms or know the HIV status of your partner(s). ? You take drugs by injection. ? You are sexually active with a partner who has HIV.  Talk with your health care provider about whether you are at high risk of being infected with HIV. If you choose to begin PrEP, you should first be tested for HIV. You should then be tested every 3 months for as long as you are taking PrEP. Pregnancy  If you are premenopausal and you may become pregnant, ask your health care provider about preconception counseling.  If you may become pregnant, take 400 to 800 micrograms (mcg) of folic acid every day.  If you want to prevent pregnancy, talk to your health  care provider about birth control (contraception). Osteoporosis and menopause  Osteoporosis is a disease in which the bones lose minerals and strength with aging. This can result in serious bone fractures. Your risk for osteoporosis can be identified using a bone density scan.  If you are 2 years of age or older, or if you are at risk for osteoporosis and fractures, ask your health care provider if you should be screened.  Ask your health care provider whether you should take a calcium or vitamin D supplement to lower your risk for osteoporosis.  Menopause may have certain physical symptoms and risks.  Hormone replacement therapy may reduce some of these symptoms and risks. Talk to your health care provider about whether hormone replacement therapy is right for you. Follow these instructions at home:  Schedule regular health, dental, and eye exams.  Stay current with your immunizations.  Do not use any tobacco products including cigarettes, chewing tobacco, or electronic cigarettes.  If you are pregnant, do not drink alcohol.  If you are breastfeeding, limit how much and how often you drink alcohol.  Limit alcohol intake to no more than 1 drink per day for nonpregnant women. One drink equals 12 ounces of beer, 5 ounces of wine, or 1 ounces of hard liquor.  Do not use street drugs.  Do not share needles.  Ask your health care provider for help if you need support or information about quitting drugs.  Tell your health care provider if you often feel depressed.  Tell your health care provider if you have ever been abused or do not feel safe at home. This information is not intended to replace advice given to you by your health care provider. Make sure you discuss any questions you have  with your health care provider. Document Released: 12/24/2010 Document Revised: 11/16/2015 Document Reviewed: 03/14/2015 Elsevier Interactive Patient Education  [employer].

## 2018-01-19 ENCOUNTER — Encounter (INDEPENDENT_AMBULATORY_CARE_PROVIDER_SITE_OTHER): Payer: Self-pay | Admitting: Family Medicine

## 2018-01-19 ENCOUNTER — Ambulatory Visit (INDEPENDENT_AMBULATORY_CARE_PROVIDER_SITE_OTHER): Payer: Medicare Other | Admitting: Family Medicine

## 2018-01-19 VITALS — BP 99/61 | HR 61 | Temp 98.4°F | Ht 65.0 in | Wt 220.0 lb

## 2018-01-19 DIAGNOSIS — R5383 Other fatigue: Secondary | ICD-10-CM | POA: Diagnosis not present

## 2018-01-19 DIAGNOSIS — E119 Type 2 diabetes mellitus without complications: Secondary | ICD-10-CM | POA: Diagnosis not present

## 2018-01-19 DIAGNOSIS — E782 Mixed hyperlipidemia: Secondary | ICD-10-CM | POA: Diagnosis not present

## 2018-01-19 DIAGNOSIS — E559 Vitamin D deficiency, unspecified: Secondary | ICD-10-CM

## 2018-01-19 DIAGNOSIS — R0602 Shortness of breath: Secondary | ICD-10-CM | POA: Diagnosis not present

## 2018-01-19 DIAGNOSIS — Z1331 Encounter for screening for depression: Secondary | ICD-10-CM | POA: Diagnosis not present

## 2018-01-19 DIAGNOSIS — I10 Essential (primary) hypertension: Secondary | ICD-10-CM

## 2018-01-19 DIAGNOSIS — Z0289 Encounter for other administrative examinations: Secondary | ICD-10-CM

## 2018-01-19 DIAGNOSIS — Z6836 Body mass index (BMI) 36.0-36.9, adult: Secondary | ICD-10-CM | POA: Diagnosis not present

## 2018-01-20 ENCOUNTER — Ambulatory Visit: Payer: Medicare Other | Admitting: Physical Therapy

## 2018-01-20 DIAGNOSIS — M25561 Pain in right knee: Secondary | ICD-10-CM

## 2018-01-20 DIAGNOSIS — M25562 Pain in left knee: Secondary | ICD-10-CM

## 2018-01-20 DIAGNOSIS — R262 Difficulty in walking, not elsewhere classified: Secondary | ICD-10-CM

## 2018-01-20 LAB — CBC WITH DIFFERENTIAL
Basophils Absolute: 0 10*3/uL (ref 0.0–0.2)
Basos: 0 %
EOS (ABSOLUTE): 0.1 10*3/uL (ref 0.0–0.4)
Eos: 1 %
Hematocrit: 35.7 % (ref 34.0–46.6)
Hemoglobin: 11.7 g/dL (ref 11.1–15.9)
Immature Grans (Abs): 0 10*3/uL (ref 0.0–0.1)
Immature Granulocytes: 0 %
Lymphocytes Absolute: 2.9 10*3/uL (ref 0.7–3.1)
Lymphs: 32 %
MCH: 26.5 pg — ABNORMAL LOW (ref 26.6–33.0)
MCHC: 32.8 g/dL (ref 31.5–35.7)
MCV: 81 fL (ref 79–97)
Monocytes Absolute: 0.9 10*3/uL (ref 0.1–0.9)
Monocytes: 10 %
Neutrophils Absolute: 5.3 10*3/uL (ref 1.4–7.0)
Neutrophils: 57 %
RBC: 4.42 x10E6/uL (ref 3.77–5.28)
RDW: 14.9 % (ref 12.3–15.4)
WBC: 9.2 10*3/uL (ref 3.4–10.8)

## 2018-01-20 LAB — MICROALBUMIN / CREATININE URINE RATIO
Creatinine, Urine: 88.6 mg/dL
Microalb/Creat Ratio: 14.1 mg/g creat (ref 0.0–30.0)
Microalbumin, Urine: 12.5 ug/mL

## 2018-01-20 LAB — LIPID PANEL WITH LDL/HDL RATIO
Cholesterol, Total: 123 mg/dL (ref 100–199)
HDL: 54 mg/dL (ref >39)
LDL Calculated: 54 mg/dL (ref 0–99)
LDl/HDL Ratio: 1 ratio (ref 0.0–3.2)
Triglycerides: 76 mg/dL (ref 0–149)
VLDL Cholesterol Cal: 15 mg/dL (ref 5–40)

## 2018-01-20 LAB — COMPREHENSIVE METABOLIC PANEL
ALT: 14 IU/L (ref 0–32)
AST: 31 IU/L (ref 0–40)
Albumin/Globulin Ratio: 2.3 — ABNORMAL HIGH (ref 1.2–2.2)
Albumin: 4.6 g/dL (ref 3.6–4.8)
Alkaline Phosphatase: 164 IU/L — ABNORMAL HIGH (ref 39–117)
BUN/Creatinine Ratio: 21 (ref 12–28)
BUN: 16 mg/dL (ref 8–27)
Bilirubin Total: 0.5 mg/dL (ref 0.0–1.2)
CO2: 23 mmol/L (ref 20–29)
Calcium: 9.2 mg/dL (ref 8.7–10.3)
Chloride: 89 mmol/L — ABNORMAL LOW (ref 96–106)
Creatinine, Ser: 0.78 mg/dL (ref 0.57–1.00)
GFR calc Af Amer: 91 mL/min/{1.73_m2} (ref >59)
GFR calc non Af Amer: 79 mL/min/{1.73_m2} (ref >59)
Globulin, Total: 2 g/dL (ref 1.5–4.5)
Glucose: 117 mg/dL — ABNORMAL HIGH (ref 65–99)
Potassium: 4.2 mmol/L (ref 3.5–5.2)
Sodium: 129 mmol/L — ABNORMAL LOW (ref 134–144)
Total Protein: 6.6 g/dL (ref 6.0–8.5)

## 2018-01-20 LAB — TSH: TSH: 5.3 u[IU]/mL — ABNORMAL HIGH (ref 0.450–4.500)

## 2018-01-20 LAB — T4, FREE: Free T4: 1.34 ng/dL (ref 0.82–1.77)

## 2018-01-20 LAB — INSULIN, RANDOM: INSULIN: 12.1 u[IU]/mL (ref 2.6–24.9)

## 2018-01-20 LAB — T3: T3, Total: 171 ng/dL (ref 71–180)

## 2018-01-20 LAB — VITAMIN D 25 HYDROXY (VIT D DEFICIENCY, FRACTURES): Vit D, 25-Hydroxy: 27.9 ng/mL — ABNORMAL LOW (ref 30.0–100.0)

## 2018-01-20 LAB — HEMOGLOBIN A1C
Est. average glucose Bld gHb Est-mCnc: 143 mg/dL
Hgb A1c MFr Bld: 6.6 % — ABNORMAL HIGH (ref 4.8–5.6)

## 2018-01-20 NOTE — Therapy (Signed)
[facility] [place], [place], [address] Phone: [phone]   Fax:  [phone]  Physical Therapy Treatment  Patient Details  Name: [name] MRN: [id number] Date of Birth: 1951-03-27 Referring Provider: T. Percell Miller   Encounter Date: 01/20/2018  PT End of Session - 01/20/18 1430    Visit Number  21    Date for PT Re-Evaluation  02/06/18    PT Start Time  1344    PT Stop Time  1442    PT Time Calculation (min)  58 min    Activity Tolerance  Patient tolerated treatment well    Behavior During Therapy  [facility] for tasks assessed/performed       Past Medical History:  Diagnosis Date  . Allergy   . Anxiety   . Arthritis    back & knee  . Asthma     mild per pt shows up with resp illness  . Chronic diastolic congestive heart failure ([place])   . Colon polyps   . Constipation   . Diabetes mellitus without complication ([place])   . Dyspnea   . Gastric polyps   . Gastroparesis   . GERD (gastroesophageal reflux disease)   . Headache    sinus headaches and migraines at times  . Heart murmur   . History of migraine headaches   . HTN (hypertension)   . Hyperlipidemia   . IBS (irritable bowel syndrome)   . Joint pain   . Lumbar disc disease   . S/P aortic valve replacement with bioprosthetic valve 08/23/2016   25 mm Edwards Intuity rapid-deployment bovine pericardial tissue valve via partial upper mini sternotomy  . Sleep apnea   . TIA (transient ischemic attack)     Past Surgical History:  Procedure Laterality Date  . ABDOMINAL HYSTERECTOMY    . AORTIC VALVE REPLACEMENT N/A 08/23/2016   Procedure: AORTIC VALVE REPLACEMENT (AVR) - using partial Upper Sternotomy- 52mm Edwards Intuity Aortic Valve used;  Surgeon: Rexene Alberts, MD;  Location: [place];  Service: Open Heart Surgery;  Laterality: N/A;  . BLADDER SUSPENSION  2007  . BREAST ENHANCEMENT SURGERY  1980  . CARPAL TUNNEL RELEASE Bilateral   .  COLONOSCOPY    . DORSAL COMPARTMENT RELEASE Right 09/15/2014   Procedure: RIGHT WRIST DEQUERVAINS RELEASE ;  Surgeon: Kathryne Hitch, MD;  Location: [place];  Service: Orthopedics;  Laterality: Right;  . ESOPHAGOGASTRODUODENOSCOPY    . KNEE ARTHROSCOPY  04/15/2012   Procedure: ARTHROSCOPY KNEE;  Surgeon: [name], MD;  Location: [place];  Service: Orthopedics;  Laterality: Right;  . LAPAROSCOPIC CHOLECYSTECTOMY    . LASIK    . OOPHORECTOMY    . PALATE TO GINGIVA GRAFT  2017  . RIGHT/LEFT HEART CATH AND CORONARY ANGIOGRAPHY N/A 08/02/2016   Procedure: Right/Left Heart Cath and Coronary Angiography;  Surgeon: Sherren Mocha, MD;  Location: [facility];  Service: Cardiovascular;  Laterality: N/A;  . ROTATOR CUFF REPAIR Left   . TEE WITHOUT CARDIOVERSION N/A 08/23/2016   Procedure: TRANSESOPHAGEAL ECHOCARDIOGRAM (TEE);  Surgeon: Rexene Alberts, MD;  Location: [place];  Service: Open Heart Surgery;  Laterality: N/A;  . TOTAL KNEE ARTHROPLASTY  04/15/2012   Procedure: TOTAL KNEE ARTHROPLASTY;  Surgeon: [name], MD;  Location: [place];  Service: Orthopedics;  Laterality: Right;  . TRIGGER FINGER RELEASE Right 04/20/2015   Procedure: RIGHT TRIGGER FINGER RELEASE (TENDON SHEALTH INCISION) ;  Surgeon: [name],  MD;  Location: [facility];  Service: Orthopedics;  Laterality: Right;    There were no vitals filed for this visit.  Subjective Assessment - 01/20/18 1428    Subjective  Pt reports doing well with no pain. Pt has been working on new diet plan outside of therapy to help with body composition.     Currently in Pain?  No/denies                       [facility] Adult PT Treatment/Exercise - 01/20/18 0001      Knee/Hip Exercises: Aerobic   Nustep  Level 5 x 24minutes      Knee/Hip Exercises: Machines for Strengthening   Cybex Knee Flexion  25# 3x15    Cybex Leg Press  40# 3x15      Knee/Hip Exercises: Standing   Hip Abduction   Left;3 sets;10 reps    Lateral Step Up  5 sets;Both;1 set    Forward Step Up  10 reps;Hand Hold: 1;Step Height: 8";1 set;Both    Walking with Sports Cord  all directions      Knee/Hip Exercises: Seated   Sit to Sand  10 reps;3 sets red theraband around knees and yellow ball      Modalities   Modalities  Electrical Stimulation;Moist Heat      Moist Heat Therapy   Number Minutes Moist Heat  15 Minutes    Moist Heat Location  Hip      Cryotherapy   Number Minutes Cryotherapy  15 Minutes    Cryotherapy Location  Knee    Type of Cryotherapy  Ice pack      Electrical Stimulation   Electrical Stimulation Location  L ITB    Electrical Stimulation Action  IFC    Electrical Stimulation Parameters  sidelying    Electrical Stimulation Goals  Pain               PT Short Term Goals - 11/20/17 1530      PT SHORT TERM GOAL #1   Title  independent with intial HEP    Status  Achieved        PT Long Term Goals - 01/15/18 1457      PT LONG TERM GOAL #1   Title  decrease pain 50%    Status  Achieved      PT LONG TERM GOAL #2   Title  returns safely to the gym    Baseline  discussed safe gym return and ex plus addition of abd/add machine, said membership and is frozen     Status  On-going      PT LONG TERM GOAL #3   Title  go up and down steps without difficulty    Baseline  still has hip pain            Plan - 01/20/18 1433    Clinical Impression Statement  Pt tolerated treatment well. Noted a little twing in the knee when negotiating stairs outside of rehab. SPTA and PTA discussed moving towards discharge and reactivating her gym membership to re-enter advanced exercise program. Pt reported that she felt she is at a good place and ready to finish treatment this week. PTA will write doctors note on Thursday.     PT Frequency  2x / week    PT Duration  8 weeks    PT Treatment/Interventions  ADLs/Self Care Home Management;Cryotherapy;Electrical  Stimulation;Iontophoresis 4mg /ml Dexamethasone;Moist Heat;Gait training;Neuromuscular re-education;Balance training;Therapeutic exercise;Therapeutic activities;Functional mobility  training;Stair training;Patient/family education;Manual techniques    PT Next Visit Plan  Doctors note for discharge and review advance HEP        Patient will benefit from skilled therapeutic intervention in order to improve the following deficits and impairments:  Abnormal gait, Decreased range of motion, Difficulty walking, Decreased endurance, Decreased activity tolerance, Pain, Impaired flexibility, Decreased strength, Decreased mobility  Visit Diagnosis: Acute pain of right knee  Acute pain of left knee  Difficulty in walking, not elsewhere classified     Problem List Patient Active Problem List   Diagnosis Date Noted  . History of CVA in adulthood 08/13/2017  . Blurry vision 08/13/2017  . Hypertension 01/02/2017  . S/P minimally invasive aortic valve replacement with bioprosthetic valve 08/23/2016  . Chronic diastolic congestive heart failure ([place])   . Osteopenia 05/27/2016  . OSA (obstructive sleep apnea) 02/27/2016  . Bruxism 10/23/2015  . Elevated alkaline phosphatase level 05/16/2015  . Atypical chest pain 11/10/2014  . Acne 05/12/2014  . Diabetes mellitus type 2, controlled ([place]) 05/12/2014  . GERD (gastroesophageal reflux disease) 03/10/2014  . Generalized anxiety disorder 03/10/2014  . Migraines 03/10/2014  . EUSTACHIAN TUBE DYSFUNCTION, CHRONIC 06/14/2010  . Irritable bowel syndrome 12/15/2008  . OBESITY, MORBID 10/12/2008  . Low back pain 08/11/2008  . Hyperlipidemia 02/09/2008  . Allergic rhinitis 02/12/2007  . ASTHMA 02/12/2007    Maryelizabeth Kaufmann, SPTA 01/20/2018, 2:42 PM  [facility] [place], [place], [address] Phone: [phone]   Fax:  [phone]  Name: [name] MRN: [id number] Date  of Birth: [birth year]

## 2018-01-21 NOTE — Progress Notes (Signed)
.  Office: [phone]  /  Fax: [phone]   HPI:   Chief Complaint: OBESITY  [name] (MR# [id number]) is a 67 y.o. female who presents on 01/19/2018 for obesity evaluation and treatment. Current BMI is Body mass index is 36.61 kg/m.[name] has struggled with obesity for years and has been unsuccessful in either losing weight or maintaining long term weight loss. [name] attended our information session and states she is currently in the action stage of change and ready to dedicate time achieving and maintaining a healthier weight.   [name] has a significant cardiovascular history. Status post cerebrovascular accident and valve replacement in 2019, on Plavix.  [name] states her family eats meals together she thinks her family will eat healthier with  her she struggles with family and or coworkers weight loss sabotage her desired weight loss is 70 lbs she started gaining weight since hysterectomy her heaviest weight ever was 237 lbs she has significant food cravings issues  she snacks frequently in the evenings she wakes up frquently in the middle of the night to eat she is frequently drinking liquids with calories she frequently eats larger portions than normal    Fatigue [name] feels her energy is lower than it should be. This has worsened with weight gain and has not worsened recently. [name] admits to daytime somnolence and  admits to waking up still tired. Patient has a history of obstructive sleep apnea with the use of CPAP. Patent has a history of symptoms of daytime fatigue. Patient generally gets 9 hours of sleep per night, and states they generally have generally restful sleep. Snoring is not present. Apneic episodes are not present. Epworth Sleepiness Score is 2.  Dyspnea on exertion [name] notes increasing shortness of breath with exercising and seems to be worsening over time with weight gain. She notes getting out of breath sooner  with activity than she used to. This has not gotten worse recently. [name] denies orthopnea.  Diabetes II [name] has a diagnosis of diabetes type II. [name] is on metformin, per patient last A1c was 6.2. She states she is not checking BGs at home. She denies hypoglycemia. She has been working on intensive lifestyle modifications including diet, exercise, and weight loss to help control her blood glucose levels.  Hyperlipidemia (Mixed) [name] has hyperlipidemia and has been trying to improve her cholesterol levels with intensive lifestyle modification including a low saturated fat diet, exercise and weight loss. She is on Lipitor and denies any chest pain, claudication or myalgias. Due for labs.  Hypertension [name] is a 67 y.o. female with hypertension. [name] is on metoprolol, blood pressure well controlled. She denies chest pain or lightheadedness. She is working weight loss to help control her blood pressure with the goal of decreasing her risk of heart attack and stroke.  Vitamin D Deficiency [name] has a diagnosis of vitamin D deficiency. She is not on Vit D and no recent labs. She notes fatigue denies nausea, vomiting or muscle weakness.  Depression Screen [name]'s Food and Mood (modified PHQ-9) score was  Depression screen PHQ 2/9 01/19/2018  Decreased Interest 1  Down, Depressed, Hopeless 1  PHQ - 2 Score 2  Altered sleeping 0  Tired, decreased energy 2  Change in appetite 1  Feeling bad or failure about yourself  1  Trouble concentrating 0  Moving slowly or fidgety/restless 0  Suicidal thoughts 0  PHQ-9 Score 6  Difficult doing work/chores Not difficult at all  Some recent data might  be hidden    ALLERGIES: Allergies  Allergen Reactions  . Augmentin [Amoxicillin-Pot Clavulanate] Rash    rash  . Erythromycin Nausea Only  . Penicillins Itching and Rash    Has patient had a PCN reaction causing immediate rash, facial/tongue/throat  swelling, SOB or lightheadedness with hypotension: No Has patient had a PCN reaction causing severe rash involving mucus membranes or skin necrosis: No Has patient had a PCN reaction that required hospitalization: No Has patient had a PCN reaction occurring within the last 10 years: No  If all of the above answers are "NO", then may proceed with Cephalosporin use.     MEDICATIONS: Current Outpatient Medications on File Prior to Visit  Medication Sig Dispense Refill  . atorvastatin (LIPITOR) 40 MG tablet Take 1 tablet (40 mg total) by mouth daily at 6 PM. 90 tablet 3  . Biotin 5000 MCG TABS Take 5,000 mcg by mouth daily.     . calcium-vitamin D (OSCAL WITH D) 500-200 MG-UNIT per tablet Take 2 tablets by mouth daily.     . clindamycin (CLEOCIN) 300 MG capsule TAKE 2 CAPSULES BY MOUTH 1 HOUR PRIOR TOO DENTAL CLEANING. 2 capsule 0  . clopidogrel (PLAVIX) 75 MG tablet Take 1 tablet (75 mg total) by mouth daily. 90 tablet 3  . fluticasone (FLONASE) 50 MCG/ACT nasal spray Place 2 sprays into both nostrils daily.     . furosemide (LASIX) 40 MG tablet Take 1 tablet (40 mg total) by mouth daily. 60 tablet 11  . glucose blood (ONETOUCH VERIO) test strip Use to test blood sugars daily. Dx: E11.9 100 each 12  . Lancets (ONETOUCH ULTRASOFT) lancets Use to test blood sugars daily. Dx: E11.9 100 each 12  . loratadine (CLARITIN) 10 MG tablet Take 10 mg by mouth at bedtime.     . metFORMIN (GLUCOPHAGE) 500 MG tablet TAKE 1 TABLET WITH BREAKFAST AND 1/2 TO 1 TABLET WITH DINNER. 180 tablet 1  . metoCLOPramide (REGLAN) 10 MG tablet TAKE 1/2 TABLET BY MOUTH 4 TIMES A DAY 60 tablet 5  . metoprolol tartrate (LOPRESSOR) 25 MG tablet Take 0.5 tablets (12.5 mg total) by mouth 2 (two) times daily. 60 tablet 11  . Multiple Vitamin (MULTIVITAMIN WITH MINERALS) TABS tablet Take 1 tablet by mouth daily.    [name] omeprazole (PRILOSEC OTC) 20 MG tablet Take 40 mg by mouth daily with breakfast.    . potassium chloride SA  (K-DUR,KLOR-CON) 20 MEQ tablet Take 1 tablet (20 mEq total) by mouth daily. 60 tablet 11  . Probiotic Product (ALIGN) 4 MG CAPS Take 4 mg by mouth daily.     . psyllium (METAMUCIL SMOOTH TEXTURE) 28 % packet Take 1 packet by mouth daily before breakfast.     . venlafaxine XR (EFFEXOR-XR) 150 MG 24 hr capsule TAKE ONE CAPSULE BY MOUTH DAILY WITH BREAKFAST 90 capsule 1   No current facility-administered medications on file prior to visit.     PAST MEDICAL HISTORY: Past Medical History:  Diagnosis Date  . Allergy   . Anxiety   . Arthritis    back & knee  . Asthma     mild per pt shows up with resp illness  . Chronic diastolic congestive heart failure ([name])   . Colon polyps   . Constipation   . Diabetes mellitus without complication ([place])   . Dyspnea   . Gastric polyps   . Gastroparesis   . GERD (gastroesophageal reflux disease)   . Headache    sinus headaches  and migraines at times  . Heart murmur   . History of migraine headaches   . HTN (hypertension)   . Hyperlipidemia   . IBS (irritable bowel syndrome)   . Joint pain   . Lumbar disc disease   . S/P aortic valve replacement with bioprosthetic valve 08/23/2016   25 mm Edwards Intuity rapid-deployment bovine pericardial tissue valve via partial upper mini sternotomy  . Sleep apnea   . TIA (transient ischemic attack)     PAST SURGICAL HISTORY: Past Surgical History:  Procedure Laterality Date  . ABDOMINAL HYSTERECTOMY    . AORTIC VALVE REPLACEMENT N/A 08/23/2016   Procedure: AORTIC VALVE REPLACEMENT (AVR) - using partial Upper Sternotomy- 75mm Edwards Intuity Aortic Valve used;  Surgeon: Rexene Alberts, MD;  Location: [facility];  Service: Open Heart Surgery;  Laterality: N/A;  . BLADDER SUSPENSION  2007  . BREAST ENHANCEMENT SURGERY  1980  . CARPAL TUNNEL RELEASE Bilateral   . COLONOSCOPY    . DORSAL COMPARTMENT RELEASE Right 09/15/2014   Procedure: RIGHT WRIST DEQUERVAINS RELEASE ;  Surgeon: Kathryne Hitch, MD;  Location:  [facility];  Service: Orthopedics;  Laterality: Right;  . ESOPHAGOGASTRODUODENOSCOPY    . KNEE ARTHROSCOPY  04/15/2012   Procedure: ARTHROSCOPY KNEE;  Surgeon: [name], MD;  Location: [facility];  Service: Orthopedics;  Laterality: Right;  . LAPAROSCOPIC CHOLECYSTECTOMY    . LASIK    . OOPHORECTOMY    . PALATE TO GINGIVA GRAFT  2017  . RIGHT/LEFT HEART CATH AND CORONARY ANGIOGRAPHY N/A 08/02/2016   Procedure: Right/Left Heart Cath and Coronary Angiography;  Surgeon: Sherren Mocha, MD;  Location: [facility];  Service: Cardiovascular;  Laterality: N/A;  . ROTATOR CUFF REPAIR Left   . TEE WITHOUT CARDIOVERSION N/A 08/23/2016   Procedure: TRANSESOPHAGEAL ECHOCARDIOGRAM (TEE);  Surgeon: Rexene Alberts, MD;  Location: [facility];  Service: Open Heart Surgery;  Laterality: N/A;  . TOTAL KNEE ARTHROPLASTY  04/15/2012   Procedure: TOTAL KNEE ARTHROPLASTY;  Surgeon: [name], MD;  Location: [facility];  Service: Orthopedics;  Laterality: Right;  . TRIGGER FINGER RELEASE Right 04/20/2015   Procedure: RIGHT TRIGGER FINGER RELEASE (TENDON SHEALTH INCISION) ;  Surgeon: [name], MD;  Location: [facility];  Service: Orthopedics;  Laterality: Right;    SOCIAL HISTORY: Social History   Tobacco Use  . Smoking status: Never Smoker  . Smokeless tobacco: Never Used  Substance Use Topics  . Alcohol use: No  . Drug use: No    FAMILY HISTORY: Family History  Problem Relation Age of Onset  . COPD Mother   . Colon polyps Mother   . Irritable bowel syndrome Mother   . Anxiety disorder Mother   . Heart disease Father   . Alcohol abuse Father   . Colon polyps Maternal Aunt     ROS: Review of Systems  Constitutional: Positive for malaise/fatigue. Negative for weight loss.  HENT: Positive for tinnitus.        + Decreased hearing + Dry mouth  Eyes:       + Wear glasses or contacts + Floaters  Respiratory: Positive for cough (allergies) and shortness of  breath.   Cardiovascular: Negative for chest pain, orthopnea and claudication.  Gastrointestinal: Positive for constipation, heartburn and nausea (occasionally). Negative for vomiting.  Musculoskeletal: Positive for back pain. Negative for myalgias.       Negative muscle weakness + Muscle or joint pain  Neurological: Positive for headaches.  Negative lightheadedness  Endo/Heme/Allergies:       Negative hypoglycemia    PHYSICAL EXAM: Blood pressure 99/61, pulse 61, temperature 98.4 F (36.9 C), temperature source Oral, height 5\' 5"  (1.651 m), weight 220 lb (99.8 kg), SpO2 99 %. Body mass index is 36.61 kg/m. Physical Exam  Constitutional: She is oriented to person, place, and time. She appears well-developed and well-nourished.  HENT:  Head: Normocephalic and atraumatic.  Nose: Nose normal.  Eyes: EOM are normal. No scleral icterus.  Neck: Normal range of motion. Neck supple. No thyromegaly present.  Cardiovascular: Normal rate and regular rhythm.  Pulmonary/Chest: Effort normal. No respiratory distress.  Abdominal: Soft. There is no tenderness.  + Obesity  Musculoskeletal:  Range of Motion normal in all 4 extremities Trace edema noted in bilateral lower extremities  Neurological: She is alert and oriented to person, place, and time. Coordination normal.  Skin: Skin is warm and dry.  Psychiatric: She has a normal mood and affect. Her behavior is normal.  Vitals reviewed.   RECENT LABS AND TESTS: BMET    Component Value Date/Time   NA 129 (L) 01/19/2018 0959   K 4.2 01/19/2018 0959   CL 89 (L) 01/19/2018 0959   CO2 23 01/19/2018 0959   GLUCOSE 117 (H) 01/19/2018 0959   GLUCOSE 118 (H) 11/25/2017 1156   BUN 16 01/19/2018 0959   CREATININE 0.78 01/19/2018 0959   CALCIUM 9.2 01/19/2018 0959   GFRNONAA 79 01/19/2018 0959   GFRAA 91 01/19/2018 0959   Lab Results  Component Value Date   HGBA1C 6.6 (H) 01/19/2018   Lab Results  Component Value Date   INSULIN  12.1 01/19/2018   CBC    Component Value Date/Time   WBC 9.2 01/19/2018 0959   WBC 8.0 01/02/2017 1205   RBC 4.42 01/19/2018 0959   RBC 4.44 01/02/2017 1205   HGB 11.7 01/19/2018 0959   HCT 35.7 01/19/2018 0959   PLT 358 04/24/2017 1231   MCV 81 01/19/2018 0959   MCH 26.5 (L) 01/19/2018 0959   MCH 27.4 08/26/2016 0248   MCHC 32.8 01/19/2018 0959   MCHC 32.3 01/02/2017 1205   RDW 14.9 01/19/2018 0959   LYMPHSABS 2.9 01/19/2018 0959   MONOABS 0.6 05/11/2015 0836   EOSABS 0.1 01/19/2018 0959   BASOSABS 0.0 01/19/2018 0959   Iron/TIBC/Ferritin/ %Sat No results found for: IRON, TIBC, FERRITIN, IRONPCTSAT Lipid Panel     Component Value Date/Time   CHOL 123 01/19/2018 0959   TRIG 76 01/19/2018 0959   HDL 54 01/19/2018 0959   CHOLHDL 3 05/23/2017 0840   VLDL 19.6 05/23/2017 0840   LDLCALC 54 01/19/2018 0959   LDLDIRECT 58.0 11/25/2017 1156   Hepatic Function Panel     Component Value Date/Time   PROT 6.6 01/19/2018 0959   ALBUMIN 4.6 01/19/2018 0959   AST 31 01/19/2018 0959   ALT 14 01/19/2018 0959   ALKPHOS 164 (H) 01/19/2018 0959   BILITOT 0.5 01/19/2018 0959   BILIDIR 0.1 05/11/2015 0836      Component Value Date/Time   TSH 5.300 (H) 01/19/2018 0959   Vitamin D No recent labs  ECG  shows NSR with a rate of 61 BPM INDIRECT CALORIMETER done today shows a VO2 of 305 and a REE of 2122. Her calculated basal metabolic rate is 4235 thus her basal metabolic rate is better than expected.    ASSESSMENT AND PLAN: Other fatigue - Plan: EKG 12-Lead, CBC With Differential, T3, T4, free, TSH  Shortness  of breath on exertion - Plan: CBC With Differential  Type 2 diabetes mellitus without complication, without long-term current use of insulin (HCC) - Plan: Comprehensive metabolic panel, Hemoglobin A1c, Insulin, random, Microalbumin / creatinine urine ratio  Mixed hyperlipidemia - Plan: Lipid Panel With LDL/HDL Ratio  Essential hypertension  Vitamin D deficiency -  Plan: VITAMIN D 25 Hydroxy (Vit-D Deficiency, Fractures)  Depression screening  Class 2 severe obesity with serious comorbidity and body mass index (BMI) of 36.0 to 36.9 in adult, unspecified obesity type [facility])  PLAN:  Fatigue Henretta was informed that her fatigue may be related to obesity, depression or many other causes. Labs will be ordered, and in the meanwhile Rozanna has agreed to work on diet, exercise and weight loss to help with fatigue. Proper sleep hygiene was discussed including the need for 7-8 hours of quality sleep each night. A sleep study was not ordered based on symptoms and Epworth score.  Dyspnea on exertion Mira's shortness of breath appears to be obesity related and exercise induced. She has agreed to work on weight loss and gradually increase exercise to treat her exercise induced shortness of breath. If Shanel follows our instructions and loses weight without improvement of her shortness of breath, we will plan to refer to pulmonology. We will monitor this condition regularly. Shamica agrees to this plan.  Diabetes II Shauniece has been given extensive diabetes education by myself today including ideal fasting and post-prandial blood glucose readings, individual ideal Hgb A1c goals and hypoglycemia prevention. We discussed the importance of good blood sugar control to decrease the likelihood of diabetic complications such as nephropathy, neuropathy, limb loss, blindness, coronary artery disease, and death. We discussed the importance of intensive lifestyle modification including diet, exercise and weight loss as the first line treatment for diabetes. Lilymae agrees to continue her diabetes medications and she is to check BGs qd fasting and 2 hour post prandials. We will check labs and Roxsana agrees to follow up with our clinic in 2 weeks.  Hyperlipidemia (Mixed) Penda was informed of the American Heart Association Guidelines emphasizing  intensive lifestyle modifications as the first line treatment for hyperlipidemia. We discussed many lifestyle modifications today in depth, and Evolette will continue to work on decreasing saturated fats such as fatty red meat, butter and many fried foods. She will start diet and will also increase vegetables and lean protein in her diet and continue to work on exercise and weight loss efforts. We will check labs and Zasha agrees to follow up with our clinic in 2 weeks.  Hypertension We discussed sodium restriction, working on healthy weight loss, and a regular exercise program as the means to achieve improved blood pressure control. Alixandra agreed with this plan and agreed to follow up as directed. We will continue to monitor her blood pressure as well as her progress with the above lifestyle modifications. She will continue her medications and will monitor for weight loss induced hypotension as she continues her lifestyle modifications. We will check labs and Jaria agrees to follow up with our clinic in 2 weeks.  Vitamin D Deficiency Taquila was informed that low vitamin D levels contributes to fatigue and are associated with obesity, breast, and colon cancer. She will follow up for routine testing of vitamin D, at least 2-3 times per year. She was informed of the risk of over-replacement of vitamin D and agrees to not increase her dose unless she discusses this with [name] first. We will check labs and Mystic agrees to  follow up with our clinic in 2 weeks.  Depression Screen Leveda had a mildly positive depression screening. Depression is commonly associated with obesity and often results in emotional eating behaviors. We will monitor this closely and work on CBT to help improve the non-hunger eating patterns. Referral to Psychology may be required if no improvement is seen as she continues in our clinic.  Obesity [place] is currently in the action stage of change and her goal  is to continue with weight loss efforts She has agreed to follow the Category 3 plan Kyira has been instructed to work up to a goal of 150 minutes of combined cardio and strengthening exercise per week for weight loss and overall health benefits. We discussed the following Behavioral Modification Strategies today: increasing lean protein intake, decreasing simple carbohydrates  and decrease eating out  [place] has agreed to follow up with our clinic in 2 weeks. She was informed of the importance of frequent follow up visits to maximize her success with intensive lifestyle modifications for her multiple health conditions. She was informed we would discuss her lab results at her next visit unless there is a critical issue that needs to be addressed sooner. Myrel agreed to keep her next visit at the agreed upon time to discuss these results.    OBESITY BEHAVIORAL INTERVENTION VISIT  Today's visit was # 1 out of 22.  Starting weight: 220 lbs Starting date: 01/19/18 Today's weight : 220 lbs  Today's date: 01/19/2018 Total lbs lost to date: 0    ASK: We discussed the diagnosis of obesity with Triniti P [place] today and Solei agreed to give [name] permission to discuss obesity behavioral modification therapy today.  ASSESS: Leontine has the diagnosis of obesity and her BMI today is 36.61 Maragret is in the action stage of change   ADVISE: Raley was educated on the multiple health risks of obesity as well as the benefit of weight loss to improve her health. She was advised of the need for long term treatment and the importance of lifestyle modifications.  AGREE: Multiple dietary modification options and treatment options were discussed and  Emelin agreed to the above obesity treatment plan.   I, Trixie Dredge, am acting as transcriptionist for Dennard Nip, MD  I have reviewed the above documentation for accuracy and completeness, and I agree with the above.  -Dennard Nip, MD

## 2018-01-22 ENCOUNTER — Ambulatory Visit: Payer: Medicare Other | Attending: Orthopedic Surgery | Admitting: Physical Therapy

## 2018-01-22 DIAGNOSIS — M25562 Pain in left knee: Secondary | ICD-10-CM

## 2018-01-22 DIAGNOSIS — M25561 Pain in right knee: Secondary | ICD-10-CM

## 2018-01-22 DIAGNOSIS — R262 Difficulty in walking, not elsewhere classified: Secondary | ICD-10-CM | POA: Diagnosis present

## 2018-01-22 NOTE — Therapy (Signed)
[employer] [place], [place], [address] Phone: [phone]   Fax:  [phone]  Physical Therapy Treatment  Patient Details  Name: [name] MRN: [id number] Date of Birth: 1951-01-05 Referring Provider: T. Percell Miller   Encounter Date: 01/22/2018  PT End of Session - 01/22/18 1612    Visit Number  22    Date for PT Re-Evaluation  02/06/18    PT Start Time  1525    PT Stop Time  1630    PT Time Calculation (min)  65 min       Past Medical History:  Diagnosis Date  . Allergy   . Anxiety   . Arthritis    back & knee  . Asthma     mild per pt shows up with resp illness  . Chronic diastolic congestive heart failure ([place])   . Colon polyps   . Constipation   . Diabetes mellitus without complication ([place])   . Dyspnea   . Gastric polyps   . Gastroparesis   . GERD (gastroesophageal reflux disease)   . Headache    sinus headaches and migraines at times  . Heart murmur   . History of migraine headaches   . HTN (hypertension)   . Hyperlipidemia   . IBS (irritable bowel syndrome)   . Joint pain   . Lumbar disc disease   . S/P aortic valve replacement with bioprosthetic valve 08/23/2016   25 mm Edwards Intuity rapid-deployment bovine pericardial tissue valve via partial upper mini sternotomy  . Sleep apnea   . TIA (transient ischemic attack)     Past Surgical History:  Procedure Laterality Date  . ABDOMINAL HYSTERECTOMY    . AORTIC VALVE REPLACEMENT N/A 08/23/2016   Procedure: AORTIC VALVE REPLACEMENT (AVR) - using partial Upper Sternotomy- 59mm Edwards Intuity Aortic Valve used;  Surgeon: Rexene Alberts, MD;  Location: [facility];  Service: Open Heart Surgery;  Laterality: N/A;  . BLADDER SUSPENSION  2007  . BREAST ENHANCEMENT SURGERY  1980  . CARPAL TUNNEL RELEASE Bilateral   . COLONOSCOPY    . DORSAL COMPARTMENT RELEASE Right 09/15/2014   Procedure: RIGHT WRIST DEQUERVAINS RELEASE ;  Surgeon:  Kathryne Hitch, MD;  Location: [facility];  Service: Orthopedics;  Laterality: Right;  . ESOPHAGOGASTRODUODENOSCOPY    . KNEE ARTHROSCOPY  04/15/2012   Procedure: ARTHROSCOPY KNEE;  Surgeon: [name], MD;  Location: [facility];  Service: Orthopedics;  Laterality: Right;  . LAPAROSCOPIC CHOLECYSTECTOMY    . LASIK    . OOPHORECTOMY    . PALATE TO GINGIVA GRAFT  2017  . RIGHT/LEFT HEART CATH AND CORONARY ANGIOGRAPHY N/A 08/02/2016   Procedure: Right/Left Heart Cath and Coronary Angiography;  Surgeon: Sherren Mocha, MD;  Location: [facility];  Service: Cardiovascular;  Laterality: N/A;  . ROTATOR CUFF REPAIR Left   . TEE WITHOUT CARDIOVERSION N/A 08/23/2016   Procedure: TRANSESOPHAGEAL ECHOCARDIOGRAM (TEE);  Surgeon: Rexene Alberts, MD;  Location: [facility];  Service: Open Heart Surgery;  Laterality: N/A;  . TOTAL KNEE ARTHROPLASTY  04/15/2012   Procedure: TOTAL KNEE ARTHROPLASTY;  Surgeon: [name], MD;  Location: [facility];  Service: Orthopedics;  Laterality: Right;  . TRIGGER FINGER RELEASE Right 04/20/2015   Procedure: RIGHT TRIGGER FINGER RELEASE (TENDON SHEALTH INCISION) ;  Surgeon: [name], MD;  Location: [facility];  Service: Orthopedics;  Laterality: Right;    There were no vitals filed  for this visit.  Subjective Assessment - 01/22/18 1535    Subjective  Pt reports                        [facility] Adult PT Treatment/Exercise - 01/22/18 0001      Knee/Hip Exercises: Aerobic   Nustep  Level 5 x 26minutes      Knee/Hip Exercises: Machines for Strengthening   Cybex Knee Extension  15# 3x10    Cybex Knee Flexion  25# 3x15    Cybex Leg Press  40# 3x15      Knee/Hip Exercises: Standing   Hip Abduction  Left;3 sets;10 reps    Lateral Step Up  Both;1 set;10 reps;Hand Hold: 1    Forward Step Up  10 reps;Hand Hold: 1;1 set;Both;Step Height: 6"    Walking with Sports Cord  all directions      Knee/Hip Exercises: Seated   Sit  to Sand  10 reps;3 sets      Knee/Hip Exercises: Supine   Straight Leg Raises  Both;1 set;10 reps red theraband      Knee/Hip Exercises: Sidelying   Hip ABduction  Both;1 set;10 reps red theraband               PT Short Term Goals - 11/20/17 1530      PT SHORT TERM GOAL #1   Title  independent with intial HEP    Status  Achieved        PT Long Term Goals - 01/22/18 1609      PT LONG TERM GOAL #2   Title  returns safely to the gym    Status  Achieved            Plan - 01/22/18 1604    Clinical Impression Statement  Pt tolerated treatment well. PTA educated and demonstrated additional exercises for banded leg raise and hip abduction. Discussed planned return to gym. Pt pleased with goal progress.     Rehab Potential  Good    PT Frequency  2x / week    PT Duration  8 weeks    PT Treatment/Interventions  ADLs/Self Care Home Management;Cryotherapy;Electrical Stimulation;Iontophoresis 4mg /ml Dexamethasone;Moist Heat;Gait training;Neuromuscular re-education;Balance training;Therapeutic exercise;Therapeutic activities;Functional mobility training;Stair training;Patient/family education;Manual techniques    PT Next Visit Plan  Pt to call after seeing MD next week and will discharge after MD appointment       Patient will benefit from skilled therapeutic intervention in order to improve the following deficits and impairments:  Abnormal gait, Decreased range of motion, Difficulty walking, Decreased endurance, Decreased activity tolerance, Pain, Impaired flexibility, Decreased strength, Decreased mobility  Visit Diagnosis: Acute pain of left knee  Acute pain of right knee  Difficulty in walking, not elsewhere classified     Problem List Patient Active Problem List   Diagnosis Date Noted  . History of CVA in adulthood 08/13/2017  . Blurry vision 08/13/2017  . Hypertension 01/02/2017  . S/P minimally invasive aortic valve replacement with bioprosthetic valve  08/23/2016  . Chronic diastolic congestive heart failure ([facility])   . Osteopenia 05/27/2016  . OSA (obstructive sleep apnea) 02/27/2016  . Bruxism 10/23/2015  . Elevated alkaline phosphatase level 05/16/2015  . Atypical chest pain 11/10/2014  . Acne 05/12/2014  . Diabetes mellitus type 2, controlled ([facility]) 05/12/2014  . GERD (gastroesophageal reflux disease) 03/10/2014  . Generalized anxiety disorder 03/10/2014  . Migraines 03/10/2014  . EUSTACHIAN TUBE DYSFUNCTION, CHRONIC 06/14/2010  . Irritable bowel syndrome 12/15/2008  .  OBESITY, MORBID 10/12/2008  . Low back pain 08/11/2008  . Hyperlipidemia 02/09/2008  . Allergic rhinitis 02/12/2007  . ASTHMA 02/12/2007    Maryelizabeth Kaufmann, SPTA 01/22/2018, 4:20 PM  [place], [place], [address] Phone: [phone]   Fax:  [phone]  Name: [name] SPEAS MRN: [id number] Date of Birth: January 28, 1951

## 2018-01-22 NOTE — Therapy (Addendum)
[place], [place], [address] Phone: [phone]   Fax:  [phone]  Patient Details  Name: [name] MRN: [id number] Date of Birth: 1950-12-01 Referring Provider:  [name], MD  Encounter Date: 01/22/2018 PHYSICAL THERAPY DISCHARGE SUMMARY    Plan: Patient agrees to discharge.  Patient goals were partially met. Patient is being discharged due to meeting the stated rehab goals.  ?????       [name],[name] 01/22/2018, 4:28 PM  [place] [address] [place], [place], [address] Phone: [phone]   Fax:  [phone]

## 2018-01-27 ENCOUNTER — Ambulatory Visit: Payer: Medicare Other | Admitting: Family Medicine

## 2018-01-27 ENCOUNTER — Encounter (INDEPENDENT_AMBULATORY_CARE_PROVIDER_SITE_OTHER): Payer: Self-pay | Admitting: Family Medicine

## 2018-01-27 ENCOUNTER — Encounter: Payer: Self-pay | Admitting: Family Medicine

## 2018-01-27 VITALS — BP 132/80 | HR 62 | Temp 98.6°F | Ht 65.0 in | Wt 222.6 lb

## 2018-01-27 DIAGNOSIS — I10 Essential (primary) hypertension: Secondary | ICD-10-CM | POA: Diagnosis not present

## 2018-01-27 DIAGNOSIS — E119 Type 2 diabetes mellitus without complications: Secondary | ICD-10-CM | POA: Diagnosis not present

## 2018-01-27 DIAGNOSIS — F411 Generalized anxiety disorder: Secondary | ICD-10-CM | POA: Diagnosis not present

## 2018-01-27 NOTE — Progress Notes (Signed)
Subjective:  [name] is a 67 y.o. year old very pleasant female patient who presents for/with See problem oriented charting ROS- intentional weight loss. No increased edema. No chest pain.    Past Medical History-  Patient Active Problem List   Diagnosis Date Noted  . History of CVA in adulthood 08/13/2017    Priority: High  . S/P minimally invasive aortic valve replacement with bioprosthetic valve 08/23/2016    Priority: High  . Chronic diastolic congestive heart failure ([facility])     Priority: High  . Diabetes mellitus type 2, controlled ([facility]) 05/12/2014    Priority: High  . Hypertension 01/02/2017    Priority: Medium  . Osteopenia 05/27/2016    Priority: Medium  . OSA (obstructive sleep apnea) 02/27/2016    Priority: Medium  . Elevated alkaline phosphatase level 05/16/2015    Priority: Medium  . GERD (gastroesophageal reflux disease) 03/10/2014    Priority: Medium  . Generalized anxiety disorder 03/10/2014    Priority: Medium  . Migraines 03/10/2014    Priority: Medium  . Irritable bowel syndrome 12/15/2008    Priority: Medium  . OBESITY, MORBID 10/12/2008    Priority: Medium  . Hyperlipidemia 02/09/2008    Priority: Medium  . Bruxism 10/23/2015    Priority: Low  . Atypical chest pain 11/10/2014    Priority: Low  . Acne 05/12/2014    Priority: Low  . EUSTACHIAN TUBE DYSFUNCTION, CHRONIC 06/14/2010    Priority: Low  . Low back pain 08/11/2008    Priority: Low  . Allergic rhinitis 02/12/2007    Priority: Low  . ASTHMA 02/12/2007    Priority: Low  . Blurry vision 08/13/2017    Medications- reviewed and updated Current Outpatient Medications  Medication Sig Dispense Refill  . atorvastatin (LIPITOR) 40 MG tablet Take 1 tablet (40 mg total) by mouth daily at 6 PM. 90 tablet 3  . Biotin 5000 MCG TABS Take 5,000 mcg by mouth daily.     . calcium-vitamin D (OSCAL WITH D) 500-200 MG-UNIT per tablet Take 2 tablets by mouth daily.     . clindamycin (CLEOCIN)  300 MG capsule TAKE 2 CAPSULES BY MOUTH 1 HOUR PRIOR TOO DENTAL CLEANING. 2 capsule 0  . clopidogrel (PLAVIX) 75 MG tablet Take 1 tablet (75 mg total) by mouth daily. 90 tablet 3  . fluticasone (FLONASE) 50 MCG/ACT nasal spray Place 2 sprays into both nostrils daily.     . furosemide (LASIX) 40 MG tablet Take 1 tablet (40 mg total) by mouth daily. 60 tablet 11  . glucose blood (ONETOUCH VERIO) test strip Use to test blood sugars daily. Dx: E11.9 100 each 12  . Lancets (ONETOUCH ULTRASOFT) lancets Use to test blood sugars daily. Dx: E11.9 100 each 12  . loratadine (CLARITIN) 10 MG tablet Take 10 mg by mouth at bedtime.     . metFORMIN (GLUCOPHAGE) 500 MG tablet TAKE 1 TABLET WITH BREAKFAST AND 1/2 TO 1 TABLET WITH DINNER. 180 tablet 1  . metoCLOPramide (REGLAN) 10 MG tablet TAKE 1/2 TABLET BY MOUTH 4 TIMES A DAY 60 tablet 5  . metoprolol tartrate (LOPRESSOR) 25 MG tablet Take 0.5 tablets (12.5 mg total) by mouth 2 (two) times daily. 60 tablet 11  . Multiple Vitamin (MULTIVITAMIN WITH MINERALS) TABS tablet Take 1 tablet by mouth daily.    [name] omeprazole (PRILOSEC OTC) 20 MG tablet Take 40 mg by mouth daily with breakfast.    . potassium chloride SA (K-DUR,KLOR-CON) 20 MEQ tablet Take 1 tablet (  20 mEq total) by mouth daily. 60 tablet 11  . Probiotic Product (ALIGN) 4 MG CAPS Take 4 mg by mouth daily.     . psyllium (METAMUCIL SMOOTH TEXTURE) 28 % packet Take 1 packet by mouth daily before breakfast.     . venlafaxine XR (EFFEXOR-XR) 150 MG 24 hr capsule TAKE ONE CAPSULE BY MOUTH DAILY WITH BREAKFAST 90 capsule 1   No current facility-administered medications for this visit.     Objective: BP 132/80 (BP Location: Left Arm, Patient Position: Sitting, Cuff Size: Normal)   Pulse 62   Temp 98.6 F (37 C) (Oral)   Ht 5\' 5"  (1.651 m)   Wt 222 lb 9.6 oz (101 kg)   SpO2 99%   BMI 37.04 kg/m  Gen: NAD, resting comfortably CV: RRR faint murmur LUSB. no rubs or gallops Lungs: CTAB no crackles, wheeze,  rhonchi Abdomen: soft/nontender/nondistended/normal bowel sounds.obese but improved- has lost weight Ext: no edema Skin: warm, dry  Assessment/Plan:  Hypertension S: controlled on lasix 40mg  and metoprolol 12.5mg  BID BP Readings from Last 3 Encounters:  01/27/18 132/80  01/19/18 99/61  01/16/18 (!) 152/50 at [facility] with [name]  A/P: We discussed blood pressure goal of <140/90. Continue current meds  Diabetes mellitus type 2, controlled ([place]) S: a1c was checked early by healthy wellness team- I told patient she may get a bill fo rthis as may not be covered.  Lab Results  Component Value Date   HGBA1C 6.6 (H) 01/19/2018   HGBA1C 6.2 (A) 11/25/2017   HGBA1C 6.4 05/23/2017   A/P: she is down 9 lbs from last visit by counting cards- hopeful that we may be able to cut back metformin 500 AM with 250-500 in PM if she continues weight loss journey   Generalized anxiety disorder S: remains on venlafaxine 150mg  with reasonable control. She has some depressed mood and phq9 of 6 A/P: anxiety with reasonable control. For possible depression- we will continue to monitor and if symptoms worsen consider modification of therapy- so close to goal 5 or less that we will monitor only for now.     Future Appointments  Date Time Provider [place]  02/03/2018  3:30 PM Starlyn Skeans, MD [facility] None  06/04/2018  9:30 AM [name], MD [facility]  06/30/2018 10:50 AM Pieter Partridge, DO [facility] None  10/02/2018  3:00 PM Parrett, Fonnie Mu, NP [facility] None   Return precautions advised.  Garret Reddish, MD

## 2018-01-27 NOTE — Patient Instructions (Addendum)
Health Maintenance Due  Topic Date Due  . Please check with your pharmacy to see if they have the shingrix vaccine. If they do- please get this immunization and update [name] by phone call or mychart with dates you receive the vaccine   . INFLUENZA VACCINE - Will await until October  Or get at allergist and let us know 01/22/2018   Blood pressure looks great! Weight loss is phenomenal- keep up the good work. Cant wait to see where you are at in December- I am expecting great things

## 2018-01-28 NOTE — Assessment & Plan Note (Signed)
S: a1c was checked early by healthy wellness team- I told patient she may get a bill fo rthis as may not be covered.  Lab Results  Component Value Date   HGBA1C 6.6 (H) 01/19/2018   HGBA1C 6.2 (A) 11/25/2017   HGBA1C 6.4 05/23/2017   A/P: she is down 9 lbs from last visit by counting cards- hopeful that we may be able to cut back metformin 500 AM with 250-500 in PM if she continues weight loss journey

## 2018-01-28 NOTE — Assessment & Plan Note (Signed)
S: controlled on lasix 40mg  and metoprolol 12.5mg  BID BP Readings from Last 3 Encounters:  01/27/18 132/80  01/19/18 99/61  01/16/18 (!) 152/50 at [facility] with [name]  A/P: We discussed blood pressure goal of <140/90. Continue current meds

## 2018-01-28 NOTE — Assessment & Plan Note (Addendum)
S: remains on venlafaxine 150mg  with reasonable control. She has some depressed mood and phq9 of 6 A/P: anxiety with reasonable control. For possible depression- we will continue to monitor and if symptoms worsen consider modification of therapy- so close to goal 5 or less that we will monitor only for now.

## 2018-01-31 ENCOUNTER — Encounter: Payer: Self-pay | Admitting: Family Medicine

## 2018-02-03 ENCOUNTER — Ambulatory Visit (INDEPENDENT_AMBULATORY_CARE_PROVIDER_SITE_OTHER): Payer: Medicare Other | Admitting: Family Medicine

## 2018-02-03 VITALS — BP 139/70 | HR 57 | Temp 98.0°F | Ht 65.0 in | Wt 219.0 lb

## 2018-02-03 DIAGNOSIS — Z6836 Body mass index (BMI) 36.0-36.9, adult: Secondary | ICD-10-CM | POA: Diagnosis not present

## 2018-02-03 DIAGNOSIS — E119 Type 2 diabetes mellitus without complications: Secondary | ICD-10-CM

## 2018-02-03 DIAGNOSIS — E559 Vitamin D deficiency, unspecified: Secondary | ICD-10-CM

## 2018-02-03 MED ORDER — INSULIN PEN NEEDLE 32G X 4 MM MISC: 1.0000 | Freq: Two times a day (BID) | 0 refills | Status: DC

## 2018-02-03 MED ORDER — LIRAGLUTIDE 18 MG/3ML ~~LOC~~ SOPN: 0.6000 mg | PEN_INJECTOR | SUBCUTANEOUS | 0 refills | Status: DC

## 2018-02-03 MED ORDER — VITAMIN D (ERGOCALCIFEROL) 1.25 MG (50000 UNIT) PO CAPS: 50000.0000 [IU] | ORAL_CAPSULE | ORAL | 0 refills | Status: DC

## 2018-02-03 NOTE — Progress Notes (Signed)
Office: [phone]  /  Fax: [phone]   HPI:   Chief Complaint: OBESITY [name] is here to discuss her progress with her obesity treatment plan. She is on the Category 3 plan and is following her eating plan approximately 100 % of the time. She states she is doing physical therapy 60 minutes 2 times per week. [name] was able to lose weight, but not as much as expected. She was unable to eat all her protein in the evening. Hunger was improved. Her weight is 219 lb (99.3 kg) today and has had a weight loss of 1 pound over a period of 2 weeks since her last visit. She has lost 1 lb since starting treatment with [name].  Vitamin D deficiency (new) [name] has a new diagnosis of vitamin D deficiency. She is on calcium with vit D, but her level is still low. [name] admits fatigue and denies nausea, vomiting or muscle weakness.  Diabetes II [name] has a diagnosis of diabetes type II. [name] states patient does not check sugars and denies any hypoglycemic episodes. A1c is elevated from 6.2 to 6.6 in six weeks. [name] forgot her blood sugar log today and she has struggled with polyphagia in the past. She has been working on intensive lifestyle modifications including diet, exercise, and weight loss to help control her blood glucose levels.  ALLERGIES: Allergies  Allergen Reactions  . Augmentin [Amoxicillin-Pot Clavulanate] Rash    rash  . Erythromycin Nausea Only  . Penicillins Itching and Rash    Has patient had a PCN reaction causing immediate rash, facial/tongue/throat swelling, SOB or lightheadedness with hypotension: No Has patient had a PCN reaction causing severe rash involving mucus membranes or skin necrosis: No Has patient had a PCN reaction that required hospitalization: No Has patient had a PCN reaction occurring within the last 10 years: No  If all of the above answers are "NO", then may proceed with Cephalosporin use.     MEDICATIONS: Current  Outpatient Medications on File Prior to Visit  Medication Sig Dispense Refill  . atorvastatin (LIPITOR) 40 MG tablet Take 1 tablet (40 mg total) by mouth daily at 6 PM. 90 tablet 3  . Biotin 5000 MCG TABS Take 5,000 mcg by mouth daily.     . calcium-vitamin D (OSCAL WITH D) 500-200 MG-UNIT per tablet Take 2 tablets by mouth daily.     . clindamycin (CLEOCIN) 300 MG capsule TAKE 2 CAPSULES BY MOUTH 1 HOUR PRIOR TOO DENTAL CLEANING. 2 capsule 0  . clopidogrel (PLAVIX) 75 MG tablet Take 1 tablet (75 mg total) by mouth daily. 90 tablet 3  . fluticasone (FLONASE) 50 MCG/ACT nasal spray Place 2 sprays into both nostrils daily.     . furosemide (LASIX) 40 MG tablet Take 1 tablet (40 mg total) by mouth daily. 60 tablet 11  . glucose blood (ONETOUCH VERIO) test strip Use to test blood sugars daily. Dx: E11.9 100 each 12  . Lancets (ONETOUCH ULTRASOFT) lancets Use to test blood sugars daily. Dx: E11.9 100 each 12  . loratadine (CLARITIN) 10 MG tablet Take 10 mg by mouth at bedtime.     . metFORMIN (GLUCOPHAGE) 500 MG tablet TAKE 1 TABLET WITH BREAKFAST AND 1/2 TO 1 TABLET WITH DINNER. 180 tablet 1  . metoCLOPramide (REGLAN) 10 MG tablet TAKE 1/2 TABLET BY MOUTH 4 TIMES A DAY 60 tablet 5  . metoprolol tartrate (LOPRESSOR) 25 MG tablet Take 0.5 tablets (12.5 mg total) by mouth 2 (two) times daily. 60 tablet  11  . Multiple Vitamin (MULTIVITAMIN WITH MINERALS) TABS tablet Take 1 tablet by mouth daily.    [name] omeprazole (PRILOSEC OTC) 20 MG tablet Take 40 mg by mouth daily with breakfast.    . potassium chloride SA (K-DUR,KLOR-CON) 20 MEQ tablet Take 1 tablet (20 mEq total) by mouth daily. 60 tablet 11  . Probiotic Product (ALIGN) 4 MG CAPS Take 4 mg by mouth daily.     . psyllium (METAMUCIL SMOOTH TEXTURE) 28 % packet Take 1 packet by mouth daily before breakfast.     . venlafaxine XR (EFFEXOR-XR) 150 MG 24 hr capsule TAKE ONE CAPSULE BY MOUTH DAILY WITH BREAKFAST 90 capsule 1   No current  facility-administered medications on file prior to visit.     PAST MEDICAL HISTORY: Past Medical History:  Diagnosis Date  . Allergy   . Anxiety   . Arthritis    back & knee  . Asthma     mild per pt shows up with resp illness  . Chronic diastolic congestive heart failure ([place])   . Colon polyps   . Constipation   . Diabetes mellitus without complication ([place])   . Dyspnea   . Gastric polyps   . Gastroparesis   . GERD (gastroesophageal reflux disease)   . Headache    sinus headaches and migraines at times  . Heart murmur   . History of migraine headaches   . HTN (hypertension)   . Hyperlipidemia   . IBS (irritable bowel syndrome)   . Joint pain   . Lumbar disc disease   . S/P aortic valve replacement with bioprosthetic valve 08/23/2016   25 mm Edwards Intuity rapid-deployment bovine pericardial tissue valve via partial upper mini sternotomy  . Sleep apnea   . TIA (transient ischemic attack)     PAST SURGICAL HISTORY: Past Surgical History:  Procedure Laterality Date  . ABDOMINAL HYSTERECTOMY    . AORTIC VALVE REPLACEMENT N/A 08/23/2016   Procedure: AORTIC VALVE REPLACEMENT (AVR) - using partial Upper Sternotomy- 37mm Edwards Intuity Aortic Valve used;  Surgeon: Rexene Alberts, MD;  Location: [facility];  Service: Open Heart Surgery;  Laterality: N/A;  . BLADDER SUSPENSION  2007  . BREAST ENHANCEMENT SURGERY  1980  . CARPAL TUNNEL RELEASE Bilateral   . COLONOSCOPY    . DORSAL COMPARTMENT RELEASE Right 09/15/2014   Procedure: RIGHT WRIST DEQUERVAINS RELEASE ;  Surgeon: Kathryne Hitch, MD;  Location: [facility];  Service: Orthopedics;  Laterality: Right;  . ESOPHAGOGASTRODUODENOSCOPY    . KNEE ARTHROSCOPY  04/15/2012   Procedure: ARTHROSCOPY KNEE;  Surgeon: [name], MD;  Location: [place];  Service: Orthopedics;  Laterality: Right;  . LAPAROSCOPIC CHOLECYSTECTOMY    . LASIK    . OOPHORECTOMY    . PALATE TO GINGIVA GRAFT  2017  . RIGHT/LEFT HEART CATH AND  CORONARY ANGIOGRAPHY N/A 08/02/2016   Procedure: Right/Left Heart Cath and Coronary Angiography;  Surgeon: Sherren Mocha, MD;  Location: [facility];  Service: Cardiovascular;  Laterality: N/A;  . ROTATOR CUFF REPAIR Left   . TEE WITHOUT CARDIOVERSION N/A 08/23/2016   Procedure: TRANSESOPHAGEAL ECHOCARDIOGRAM (TEE);  Surgeon: Rexene Alberts, MD;  Location: [place];  Service: Open Heart Surgery;  Laterality: N/A;  . TOTAL KNEE ARTHROPLASTY  04/15/2012   Procedure: TOTAL KNEE ARTHROPLASTY;  Surgeon: [name], MD;  Location: [place];  Service: Orthopedics;  Laterality: Right;  . TRIGGER FINGER RELEASE Right 04/20/2015   Procedure: RIGHT TRIGGER FINGER RELEASE (  TENDON SHEALTH INCISION) ;  Surgeon: [name], MD;  Location: [place] [facility];  Service: Orthopedics;  Laterality: Right;    SOCIAL HISTORY: Social History   Tobacco Use  . Smoking status: Never Smoker  . Smokeless tobacco: Never Used  Substance Use Topics  . Alcohol use: No  . Drug use: No    FAMILY HISTORY: Family History  Problem Relation Age of Onset  . COPD Mother   . Colon polyps Mother   . Irritable bowel syndrome Mother   . Anxiety disorder Mother   . Heart disease Father   . Alcohol abuse Father   . Colon polyps Maternal Aunt     ROS: Review of Systems  Constitutional: Positive for malaise/fatigue and weight loss.  Gastrointestinal: Negative for nausea and vomiting.  Musculoskeletal:       Negative for muscle weakness  Endo/Heme/Allergies:       Positive for polyphagia    PHYSICAL EXAM: Blood pressure 139/70, pulse (!) 57, temperature 98 F (36.7 C), temperature source Oral, height 5\' 5"  (1.651 m), weight 219 lb (99.3 kg), SpO2 98 %. Body mass index is 36.44 kg/m. Physical Exam  Constitutional: She is oriented to person, place, and time. She appears well-developed and well-nourished.  Cardiovascular: Normal rate.  Pulmonary/Chest: Effort normal.  Musculoskeletal: Normal range  of motion.  Neurological: She is oriented to person, place, and time.  Skin: Skin is warm and dry.  Psychiatric: She has a normal mood and affect. Her behavior is normal.  Vitals reviewed.   RECENT LABS AND TESTS: BMET    Component Value Date/Time   NA 129 (L) 01/19/2018 0959   K 4.2 01/19/2018 0959   CL 89 (L) 01/19/2018 0959   CO2 23 01/19/2018 0959   GLUCOSE 117 (H) 01/19/2018 0959   GLUCOSE 118 (H) 11/25/2017 1156   BUN 16 01/19/2018 0959   CREATININE 0.78 01/19/2018 0959   CALCIUM 9.2 01/19/2018 0959   GFRNONAA 79 01/19/2018 0959   GFRAA 91 01/19/2018 0959   Lab Results  Component Value Date   HGBA1C 6.6 (H) 01/19/2018   HGBA1C 6.2 (A) 11/25/2017   HGBA1C 6.4 05/23/2017   HGBA1C 6.7 (H) 01/02/2017   HGBA1C 6.6 (H) 08/21/2016   Lab Results  Component Value Date   INSULIN 12.1 01/19/2018   CBC    Component Value Date/Time   WBC 9.2 01/19/2018 0959   WBC 8.0 01/02/2017 1205   RBC 4.42 01/19/2018 0959   RBC 4.44 01/02/2017 1205   HGB 11.7 01/19/2018 0959   HCT 35.7 01/19/2018 0959   PLT 358 04/24/2017 1231   MCV 81 01/19/2018 0959   MCH 26.5 (L) 01/19/2018 0959   MCH 27.4 08/26/2016 0248   MCHC 32.8 01/19/2018 0959   MCHC 32.3 01/02/2017 1205   RDW 14.9 01/19/2018 0959   LYMPHSABS 2.9 01/19/2018 0959   MONOABS 0.6 05/11/2015 0836   EOSABS 0.1 01/19/2018 0959   BASOSABS 0.0 01/19/2018 0959   Iron/TIBC/Ferritin/ %Sat No results found for: IRON, TIBC, FERRITIN, IRONPCTSAT Lipid Panel     Component Value Date/Time   CHOL 123 01/19/2018 0959   TRIG 76 01/19/2018 0959   HDL 54 01/19/2018 0959   CHOLHDL 3 05/23/2017 0840   VLDL 19.6 05/23/2017 0840   LDLCALC 54 01/19/2018 0959   LDLDIRECT 58.0 11/25/2017 1156   Hepatic Function Panel     Component Value Date/Time   PROT 6.6 01/19/2018 0959   ALBUMIN 4.6 01/19/2018 0959   AST 31 01/19/2018  0959   ALT 14 01/19/2018 0959   ALKPHOS 164 (H) 01/19/2018 0959   BILITOT 0.5 01/19/2018 0959   BILIDIR 0.1  05/11/2015 0836      Component Value Date/Time   TSH 5.300 (H) 01/19/2018 0959   TSH 3.390 04/24/2017 1231   TSH 3.340 09/16/2016 1552   Results for DAYSY, SANTINI (MRN [id number]) as of 02/03/2018 17:18  Ref. Range 01/19/2018 09:59  Vitamin D, 25-Hydroxy Latest Ref Range: 30.0 - 100.0 ng/mL 27.9 (L)   ASSESSMENT AND PLAN: Vitamin D deficiency - Plan: Vitamin D, Ergocalciferol, (DRISDOL) 50000 units CAPS capsule  Type 2 diabetes mellitus without complication, without long-term current use of insulin (HCC) - Plan: liraglutide (VICTOZA) 18 MG/3ML SOPN, Insulin Pen Needle (BD PEN NEEDLE NANO 2ND GEN) 32G X 4 MM MISC  Class 2 severe obesity with serious comorbidity and body mass index (BMI) of 36.0 to 36.9 in adult, unspecified obesity type ([place])  PLAN:  Vitamin D Deficiency (new) [name] was informed that low vitamin D levels contributes to fatigue and are associated with obesity, breast, and colon cancer. She agrees to start prescription Vit D @50 ,000 IU every week #4 with no refills. We will recheck labs in 3 months  and will follow up for routine testing of vitamin D, at least 2-3 times per year. She was informed of the risk of over-replacement of vitamin D and agrees to not increase her dose unless she discusses this with [name] first. [name] agrees to follow up as directed.  Diabetes II [name] has been given extensive diabetes education by myself today including ideal fasting and post-prandial blood glucose readings, individual ideal Hgb A1c goals and hypoglycemia prevention. We discussed the importance of good blood sugar control to decrease the likelihood of diabetic complications such as nephropathy, neuropathy, limb loss, blindness, coronary artery disease, and death. We discussed the importance of intensive lifestyle modification including diet, exercise and weight loss as the first line treatment for diabetes. [name] agrees to start victoza 0.6 mg #1 pen with no refills  and nano needles and continue metformin. [name] agrees to follow up at the agreed upon time.  Obesity [name] is currently in the action stage of change. As such, her goal is to continue with weight loss efforts She has agreed to follow the Category 3 plan [name] has been instructed to work up to a goal of 150 minutes of combined cardio and strengthening exercise per week for weight loss and overall health benefits. We discussed the following Behavioral Modification Strategies today: increasing lean protein intake, decreasing simple carbohydrates  and work on meal planning and easy cooking plans  [name] has agreed to follow up with our clinic in 2 weeks. She was informed of the importance of frequent follow up visits to maximize her success with intensive lifestyle modifications for her multiple health conditions.   OBESITY BEHAVIORAL INTERVENTION VISIT  Today's visit was # 2 out of 22.  Starting weight: 220 lbs Starting date: 01/19/18 Today's weight : 219 lbs Today's date: 02/03/2018 Total lbs lost to date: 1    ASK: We discussed the diagnosis of obesity with [name] today and [name] agreed to give [name] permission to discuss obesity behavioral modification therapy today.  ASSESS: [name] has the diagnosis of obesity and her BMI today is 36.44 [name] is in the action stage of change   ADVISE: [name] was educated on the multiple health risks of obesity as well as the benefit of weight loss to improve her health.  She was advised of the need for long term treatment and the importance of lifestyle modifications.  AGREE: Multiple dietary modification options and treatment options were discussed and  [name] agreed to the above obesity treatment plan.  I, Doreene Nest, am acting as transcriptionist for Dennard Nip, MD  I have reviewed the above documentation for accuracy and completeness, and I agree with the above. -Dennard Nip, MD

## 2018-02-04 ENCOUNTER — Encounter (INDEPENDENT_AMBULATORY_CARE_PROVIDER_SITE_OTHER): Payer: Self-pay | Admitting: Family Medicine

## 2018-02-05 ENCOUNTER — Encounter: Payer: Self-pay | Admitting: Family Medicine

## 2018-02-09 ENCOUNTER — Other Ambulatory Visit: Payer: Self-pay

## 2018-02-09 DIAGNOSIS — E871 Hypo-osmolality and hyponatremia: Secondary | ICD-10-CM

## 2018-02-11 ENCOUNTER — Encounter (INDEPENDENT_AMBULATORY_CARE_PROVIDER_SITE_OTHER): Payer: Self-pay | Admitting: Family Medicine

## 2018-02-12 ENCOUNTER — Encounter: Payer: Self-pay | Admitting: Family Medicine

## 2018-02-12 ENCOUNTER — Other Ambulatory Visit (INDEPENDENT_AMBULATORY_CARE_PROVIDER_SITE_OTHER): Payer: Medicare Other

## 2018-02-12 DIAGNOSIS — E871 Hypo-osmolality and hyponatremia: Secondary | ICD-10-CM | POA: Diagnosis not present

## 2018-02-12 LAB — BASIC METABOLIC PANEL
BUN: 16 mg/dL (ref 6–23)
CO2: 25 mEq/L (ref 19–32)
Calcium: 9.6 mg/dL (ref 8.4–10.5)
Chloride: 96 mEq/L (ref 96–112)
Creatinine, Ser: 0.65 mg/dL (ref 0.40–1.20)
GFR: 96.57 mL/min (ref >60.00)
Glucose, Bld: 114 mg/dL — ABNORMAL HIGH (ref 70–99)
Potassium: 4.3 mEq/L (ref 3.5–5.1)
Sodium: 128 mEq/L — ABNORMAL LOW (ref 135–145)

## 2018-02-13 ENCOUNTER — Other Ambulatory Visit: Payer: Self-pay

## 2018-02-13 DIAGNOSIS — E871 Hypo-osmolality and hyponatremia: Secondary | ICD-10-CM

## 2018-02-13 DIAGNOSIS — Z953 Presence of xenogenic heart valve: Secondary | ICD-10-CM

## 2018-02-13 MED ORDER — FUROSEMIDE 20 MG PO TABS: 20.0000 mg | ORAL_TABLET | Freq: Every day | ORAL | 3 refills | Status: DC

## 2018-02-17 ENCOUNTER — Ambulatory Visit (INDEPENDENT_AMBULATORY_CARE_PROVIDER_SITE_OTHER): Payer: Medicare Other | Admitting: Physician Assistant

## 2018-02-17 ENCOUNTER — Ambulatory Visit: Payer: Medicare Other | Admitting: Family Medicine

## 2018-02-17 ENCOUNTER — Encounter (INDEPENDENT_AMBULATORY_CARE_PROVIDER_SITE_OTHER): Payer: Self-pay | Admitting: Physician Assistant

## 2018-02-17 ENCOUNTER — Encounter: Payer: Self-pay | Admitting: Family Medicine

## 2018-02-17 VITALS — BP 117/64 | HR 62 | Temp 98.3°F | Ht 65.0 in | Wt 216.0 lb

## 2018-02-17 VITALS — BP 124/74 | HR 92 | Temp 98.2°F | Ht 65.0 in | Wt 221.0 lb

## 2018-02-17 DIAGNOSIS — E119 Type 2 diabetes mellitus without complications: Secondary | ICD-10-CM

## 2018-02-17 DIAGNOSIS — Z6835 Body mass index (BMI) 35.0-35.9, adult: Secondary | ICD-10-CM

## 2018-02-17 DIAGNOSIS — E871 Hypo-osmolality and hyponatremia: Secondary | ICD-10-CM | POA: Insufficient documentation

## 2018-02-17 DIAGNOSIS — E559 Vitamin D deficiency, unspecified: Secondary | ICD-10-CM | POA: Diagnosis not present

## 2018-02-17 LAB — CBC
HCT: 34.8 % — ABNORMAL LOW (ref 36.0–46.0)
Hemoglobin: 11.4 g/dL — ABNORMAL LOW (ref 12.0–15.0)
MCHC: 32.7 g/dL (ref 30.0–36.0)
MCV: 81.1 fl (ref 78.0–100.0)
Platelets: 384 10*3/uL (ref 150.0–400.0)
RBC: 4.29 Mil/uL (ref 3.87–5.11)
RDW: 14.5 % (ref 11.5–15.5)
WBC: 9.1 10*3/uL (ref 4.0–10.5)

## 2018-02-17 LAB — COMPREHENSIVE METABOLIC PANEL
ALT: 13 U/L (ref 0–35)
AST: 24 U/L (ref 0–37)
Albumin: 4.5 g/dL (ref 3.5–5.2)
Alkaline Phosphatase: 144 U/L — ABNORMAL HIGH (ref 39–117)
BUN: 18 mg/dL (ref 6–23)
CO2: 24 mEq/L (ref 19–32)
Calcium: 9.4 mg/dL (ref 8.4–10.5)
Chloride: 96 mEq/L (ref 96–112)
Creatinine, Ser: 0.66 mg/dL (ref 0.40–1.20)
GFR: 94.88 mL/min (ref >60.00)
Glucose, Bld: 115 mg/dL — ABNORMAL HIGH (ref 70–99)
Potassium: 4.8 mEq/L (ref 3.5–5.1)
Sodium: 128 mEq/L — ABNORMAL LOW (ref 135–145)
Total Bilirubin: 0.4 mg/dL (ref 0.2–1.2)
Total Protein: 6.7 g/dL (ref 6.0–8.3)

## 2018-02-17 LAB — SODIUM: Sodium: 128 mEq/L — ABNORMAL LOW (ref 135–145)

## 2018-02-17 MED ORDER — VITAMIN D (ERGOCALCIFEROL) 1.25 MG (50000 UNIT) PO CAPS: 50000.0000 [IU] | ORAL_CAPSULE | ORAL | 0 refills | Status: DC

## 2018-02-17 NOTE — Assessment & Plan Note (Signed)
S: Patient presents with recurrent hyponatremia. Noted since early 2018 but has worsened under 130 over last few months- at 128 last check. She admits to drinking 64 oz of water a day for her diet. She does say has dry mouth and in general feels thirsty as well  She reports she has been off lasix for some time.   Other medicines she is on that could contribute to this are venlafaxine 150mg  XR.   Her lipids were normal in July so this is not related to high triglycerides  A/P: Hyponatremia that appears to be hypotonic. will get labs as below. Wonder about SIADH from venlafaxine- wonder if cutting dose would be helpful enough as she does need for anxiety. Also wonder if we are going to need to water restrict.

## 2018-02-17 NOTE — Progress Notes (Signed)
Subjective:  [name] is a 67 y.o. year old very pleasant female patient who presents for/with See problem oriented charting ROS- no lightheadedness, dizziness. No lightheadedness with standing. No chest pain or shortness of breath.    Past Medical History-  Patient Active Problem List   Diagnosis Date Noted  . History of CVA in adulthood 08/13/2017    Priority: High  . S/P minimally invasive aortic valve replacement with bioprosthetic valve 08/23/2016    Priority: High  . Chronic diastolic congestive heart failure ([place])     Priority: High  . Diabetes mellitus type 2, controlled ([place]) 05/12/2014    Priority: High  . Hypertension 01/02/2017    Priority: Medium  . Osteopenia 05/27/2016    Priority: Medium  . OSA (obstructive sleep apnea) 02/27/2016    Priority: Medium  . Elevated alkaline phosphatase level 05/16/2015    Priority: Medium  . GERD (gastroesophageal reflux disease) 03/10/2014    Priority: Medium  . Generalized anxiety disorder 03/10/2014    Priority: Medium  . Migraines 03/10/2014    Priority: Medium  . Irritable bowel syndrome 12/15/2008    Priority: Medium  . OBESITY, MORBID 10/12/2008    Priority: Medium  . Hyperlipidemia 02/09/2008    Priority: Medium  . Bruxism 10/23/2015    Priority: Low  . Atypical chest pain 11/10/2014    Priority: Low  . Acne 05/12/2014    Priority: Low  . EUSTACHIAN TUBE DYSFUNCTION, CHRONIC 06/14/2010    Priority: Low  . Low back pain 08/11/2008    Priority: Low  . Allergic rhinitis 02/12/2007    Priority: Low  . ASTHMA 02/12/2007    Priority: Low  . Hyponatremia 02/17/2018  . Blurry vision 08/13/2017    Medications- reviewed and updated Current Outpatient Medications  Medication Sig Dispense Refill  . atorvastatin (LIPITOR) 40 MG tablet Take 1 tablet (40 mg total) by mouth daily at 6 PM. 90 tablet 3  . Biotin 5000 MCG TABS Take 5,000 mcg by mouth daily.     . calcium-vitamin D (OSCAL WITH D) 500-200 MG-UNIT  per tablet Take 2 tablets by mouth daily.     . clindamycin (CLEOCIN) 300 MG capsule TAKE 2 CAPSULES BY MOUTH 1 HOUR PRIOR TOO DENTAL CLEANING. 2 capsule 0  . clopidogrel (PLAVIX) 75 MG tablet Take 1 tablet (75 mg total) by mouth daily. 90 tablet 3  . fluticasone (FLONASE) 50 MCG/ACT nasal spray Place 2 sprays into both nostrils daily.     [name] glucose blood (ONETOUCH VERIO) test strip Use to test blood sugars daily. Dx: E11.9 100 each 12  . Insulin Pen Needle (BD PEN NEEDLE NANO 2ND GEN) 32G X 4 MM MISC 1 Package by Does not apply route 2 (two) times daily. 100 each 0  . Lancets (ONETOUCH ULTRASOFT) lancets Use to test blood sugars daily. Dx: E11.9 100 each 12  . liraglutide (VICTOZA) 18 MG/3ML SOPN Inject 0.1 mLs (0.6 mg total) into the skin every morning. 1 pen 0  . loratadine (CLARITIN) 10 MG tablet Take 10 mg by mouth at bedtime.     . metFORMIN (GLUCOPHAGE) 500 MG tablet TAKE 1 TABLET WITH BREAKFAST AND 1/2 TO 1 TABLET WITH DINNER. 180 tablet 1  . metoCLOPramide (REGLAN) 10 MG tablet TAKE 1/2 TABLET BY MOUTH 4 TIMES A DAY 60 tablet 5  . metoprolol tartrate (LOPRESSOR) 25 MG tablet Take 0.5 tablets (12.5 mg total) by mouth 2 (two) times daily. 60 tablet 11  . Multiple Vitamin (MULTIVITAMIN WITH  MINERALS) TABS tablet Take 1 tablet by mouth daily.    [name] omeprazole (PRILOSEC OTC) 20 MG tablet Take 40 mg by mouth daily with breakfast.    . potassium chloride SA (K-DUR,KLOR-CON) 20 MEQ tablet Take 1 tablet (20 mEq total) by mouth daily. 60 tablet 11  . Probiotic Product (ALIGN) 4 MG CAPS Take 4 mg by mouth daily.     . psyllium (METAMUCIL SMOOTH TEXTURE) 28 % packet Take 1 packet by mouth daily before breakfast.     . venlafaxine XR (EFFEXOR-XR) 150 MG 24 hr capsule TAKE ONE CAPSULE BY MOUTH DAILY WITH BREAKFAST 90 capsule 1  . Vitamin D, Ergocalciferol, (DRISDOL) 50000 units CAPS capsule Take 1 capsule (50,000 Units total) by mouth every 7 (seven) days. 4 capsule 0   Objective: BP 124/74 (BP  Location: Left Arm, Patient Position: Sitting, Cuff Size: Large)   Pulse 92   Temp 98.2 F (36.8 C) (Oral)   Ht 5\' 5"  (1.651 m)   Wt 221 lb (100.2 kg)   LMP  (LMP Unknown)   SpO2 100%   BMI 36.78 kg/m  Gen: NAD, resting comfortably CV: RRR no murmurs rubs or gallops Lungs: CTAB no crackles, wheeze, rhonchi Abdomen: soft/nontender/nondistended/obese Ext: no edema Skin: warm, dry Neuro: speech normal  Assessment/Plan:  Hyponatremia S: Patient presents with recurrent hyponatremia. Noted since early 2018 but has worsened under 130 over last few months- at 128 last check. She admits to drinking 64 oz of water a day for her diet. She does say has dry mouth and in general feels thirsty as well  She reports she has been off lasix for some time.   Other medicines she is on that could contribute to this are venlafaxine 150mg  XR.   Her lipids were normal in July so this is not related to high triglycerides  A/P: Hyponatremia that appears to be hypotonic. will get labs as below. Wonder about SIADH from venlafaxine- wonder if cutting dose would be helpful enough as she does need for anxiety. Also wonder if we are going to need to water restrict.   Likely next steps through labs/phone calls/mycharts- we discussed potential repeat labs or urine tests.   Future Appointments  Date Time Provider [place]  02/17/2018  3:00 PM [name], [place] [facility] None  06/04/2018  9:30 AM [name], MD [facility]  06/12/2018  3:40 PM Dorothy Spark, MD [facility]  06/30/2018 10:50 AM Pieter Partridge, DO [facility] None  10/02/2018  3:00 PM Parrett, Fonnie Mu, NP [facility] None    Lab/Order associations: Hyponatremia - Plan: Osmolality, Osmolality, urine, Sodium, CBC, Comprehensive metabolic panel  Return precautions advised.  Garret Reddish, MD

## 2018-02-17 NOTE — Patient Instructions (Addendum)
Please stop by lab before you go  We will reach out to you about next steps. I am considering cutting down venlafaxine and also possible water restriction after talking to Dr. Leafy Ro.

## 2018-02-18 ENCOUNTER — Other Ambulatory Visit: Payer: Self-pay

## 2018-02-18 MED ORDER — GLUCOSE BLOOD VI STRP: ORAL_STRIP | 12 refills | Status: DC

## 2018-02-18 NOTE — Progress Notes (Signed)
Office: [phone]  /  Fax: [phone]   HPI:   Chief Complaint: OBESITY [name] is here to discuss her progress with her obesity treatment plan. She is on the  follow the Category 3 plan and is following her eating plan approximately 95 % of the time. She states she is exercising 0 minutes 0 times per week. [name] did very well with weight loss. She reports some continued nausea on low dose Victoza, but states it is improving.  Her weight is 216 lb (98 kg) today and has had a weight loss of 3 pounds over a period of 2 weeks since her last visit. She has lost 4 lbs since starting treatment with [name].  Diabetes II [name] has a diagnosis of diabetes type II. [name] states fasting BGs range between 112 and 135 and denies any hypoglycemic episodes, but reports nausea. Last A1c was 6.6.  She has been working on intensive lifestyle modifications including diet, exercise, and weight loss to help control her blood glucose levels.  Vitamin D deficiency [name] has a diagnosis of vitamin D deficiency. She is currently taking vit D and denies vomiting or muscle weakness.    Ref. Range 01/19/2018 09:59  Vitamin D, 25-Hydroxy Latest Ref Range: 30.0 - 100.0 ng/mL 27.9 (L)   ALLERGIES: Allergies  Allergen Reactions  . Augmentin [Amoxicillin-Pot Clavulanate] Rash    rash  . Erythromycin Nausea Only  . Penicillins Itching and Rash    Has patient had a PCN reaction causing immediate rash, facial/tongue/throat swelling, SOB or lightheadedness with hypotension: No Has patient had a PCN reaction causing severe rash involving mucus membranes or skin necrosis: No Has patient had a PCN reaction that required hospitalization: No Has patient had a PCN reaction occurring within the last 10 years: No  If all of the above answers are "NO", then may proceed with Cephalosporin use.     MEDICATIONS: Current Outpatient Medications on File Prior to Visit  Medication Sig Dispense Refill  .  atorvastatin (LIPITOR) 40 MG tablet Take 1 tablet (40 mg total) by mouth daily at 6 PM. 90 tablet 3  . Biotin 5000 MCG TABS Take 5,000 mcg by mouth daily.     . calcium-vitamin D (OSCAL WITH D) 500-200 MG-UNIT per tablet Take 2 tablets by mouth daily.     . clindamycin (CLEOCIN) 300 MG capsule TAKE 2 CAPSULES BY MOUTH 1 HOUR PRIOR TOO DENTAL CLEANING. 2 capsule 0  . clopidogrel (PLAVIX) 75 MG tablet Take 1 tablet (75 mg total) by mouth daily. 90 tablet 3  . fluticasone (FLONASE) 50 MCG/ACT nasal spray Place 2 sprays into both nostrils daily.     . Insulin Pen Needle (BD PEN NEEDLE NANO 2ND GEN) 32G X 4 MM MISC 1 Package by Does not apply route 2 (two) times daily. 100 each 0  . Lancets (ONETOUCH ULTRASOFT) lancets Use to test blood sugars daily. Dx: E11.9 100 each 12  . liraglutide (VICTOZA) 18 MG/3ML SOPN Inject 0.1 mLs (0.6 mg total) into the skin every morning. 1 pen 0  . loratadine (CLARITIN) 10 MG tablet Take 10 mg by mouth at bedtime.     . metFORMIN (GLUCOPHAGE) 500 MG tablet TAKE 1 TABLET WITH BREAKFAST AND 1/2 TO 1 TABLET WITH DINNER. 180 tablet 1  . metoCLOPramide (REGLAN) 10 MG tablet TAKE 1/2 TABLET BY MOUTH 4 TIMES A DAY 60 tablet 5  . metoprolol tartrate (LOPRESSOR) 25 MG tablet Take 0.5 tablets (12.5 mg total) by mouth 2 (two) times daily.  60 tablet 11  . Multiple Vitamin (MULTIVITAMIN WITH MINERALS) TABS tablet Take 1 tablet by mouth daily.    [name] omeprazole (PRILOSEC OTC) 20 MG tablet Take 40 mg by mouth daily with breakfast.    . potassium chloride SA (K-DUR,KLOR-CON) 20 MEQ tablet Take 1 tablet (20 mEq total) by mouth daily. 60 tablet 11  . Probiotic Product (ALIGN) 4 MG CAPS Take 4 mg by mouth daily.     . psyllium (METAMUCIL SMOOTH TEXTURE) 28 % packet Take 1 packet by mouth daily before breakfast.     . venlafaxine XR (EFFEXOR-XR) 150 MG 24 hr capsule TAKE ONE CAPSULE BY MOUTH DAILY WITH BREAKFAST 90 capsule 1   No current facility-administered medications on file prior to  visit.     PAST MEDICAL HISTORY: Past Medical History:  Diagnosis Date  . Allergy   . Anxiety   . Arthritis    back & knee  . Asthma     mild per pt shows up with resp illness  . Chronic diastolic congestive heart failure ([place])   . Colon polyps   . Constipation   . Diabetes mellitus without complication ([place])   . Dyspnea   . Gastric polyps   . Gastroparesis   . GERD (gastroesophageal reflux disease)   . Headache    sinus headaches and migraines at times  . Heart murmur   . History of migraine headaches   . HTN (hypertension)   . Hyperlipidemia   . IBS (irritable bowel syndrome)   . Joint pain   . Lumbar disc disease   . S/P aortic valve replacement with bioprosthetic valve 08/23/2016   25 mm Edwards Intuity rapid-deployment bovine pericardial tissue valve via partial upper mini sternotomy  . Sleep apnea   . TIA (transient ischemic attack)     PAST SURGICAL HISTORY: Past Surgical History:  Procedure Laterality Date  . ABDOMINAL HYSTERECTOMY    . AORTIC VALVE REPLACEMENT N/A 08/23/2016   Procedure: AORTIC VALVE REPLACEMENT (AVR) - using partial Upper Sternotomy- 67mm Edwards Intuity Aortic Valve used;  Surgeon: Rexene Alberts, MD;  Location: [facility];  Service: Open Heart Surgery;  Laterality: N/A;  . BLADDER SUSPENSION  2007  . BREAST ENHANCEMENT SURGERY  1980  . CARPAL TUNNEL RELEASE Bilateral   . COLONOSCOPY    . DORSAL COMPARTMENT RELEASE Right 09/15/2014   Procedure: RIGHT WRIST DEQUERVAINS RELEASE ;  Surgeon: Kathryne Hitch, MD;  Location: [facility];  Service: Orthopedics;  Laterality: Right;  . ESOPHAGOGASTRODUODENOSCOPY    . KNEE ARTHROSCOPY  04/15/2012   Procedure: ARTHROSCOPY KNEE;  Surgeon: [name], MD;  Location: [facility];  Service: Orthopedics;  Laterality: Right;  . LAPAROSCOPIC CHOLECYSTECTOMY    . LASIK    . OOPHORECTOMY    . PALATE TO GINGIVA GRAFT  2017  . RIGHT/LEFT HEART CATH AND CORONARY ANGIOGRAPHY N/A 08/02/2016   Procedure:  Right/Left Heart Cath and Coronary Angiography;  Surgeon: Sherren Mocha, MD;  Location: [facility];  Service: Cardiovascular;  Laterality: N/A;  . ROTATOR CUFF REPAIR Left   . TEE WITHOUT CARDIOVERSION N/A 08/23/2016   Procedure: TRANSESOPHAGEAL ECHOCARDIOGRAM (TEE);  Surgeon: Rexene Alberts, MD;  Location: [facility];  Service: Open Heart Surgery;  Laterality: N/A;  . TOTAL KNEE ARTHROPLASTY  04/15/2012   Procedure: TOTAL KNEE ARTHROPLASTY;  Surgeon: [name], MD;  Location: [facility];  Service: Orthopedics;  Laterality: Right;  . TRIGGER FINGER RELEASE Right 04/20/2015   Procedure: RIGHT TRIGGER  FINGER RELEASE (TENDON SHEALTH INCISION) ;  Surgeon: [name], MD;  Location: [facility];  Service: Orthopedics;  Laterality: Right;    SOCIAL HISTORY: Social History   Tobacco Use  . Smoking status: Never Smoker  . Smokeless tobacco: Never Used  Substance Use Topics  . Alcohol use: No  . Drug use: No    FAMILY HISTORY: Family History  Problem Relation Age of Onset  . COPD Mother   . Colon polyps Mother   . Irritable bowel syndrome Mother   . Anxiety disorder Mother   . Heart disease Father   . Alcohol abuse Father   . Colon polyps Maternal Aunt     ROS: Review of Systems  Constitutional: Positive for weight loss. Negative for malaise/fatigue.  Gastrointestinal: Positive for nausea. Negative for vomiting.  Musculoskeletal:       Negative for muscle weakness  Endo/Heme/Allergies:       Negative for hypoglycemia    PHYSICAL EXAM: Blood pressure 117/64, pulse 62, temperature 98.3 F (36.8 C), temperature source Oral, height 5\' 5"  (1.651 m), weight 216 lb (98 kg), SpO2 99 %. Body mass index is 35.94 kg/m. Physical Exam  Constitutional: She is oriented to person, place, and time. She appears well-developed and well-nourished.  HENT:  Head: Normocephalic.  Eyes: Pupils are equal, round, and reactive to light. EOM are normal.  Neck: Normal range of  motion.  Cardiovascular: Normal rate.  Pulmonary/Chest: Effort normal.  Musculoskeletal: Normal range of motion.  Neurological: She is alert and oriented to person, place, and time.  Skin: Skin is warm and dry.  Psychiatric: She has a normal mood and affect. Her behavior is normal.    RECENT LABS AND TESTS: BMET    Component Value Date/Time   NA 128 (L) 02/17/2018 0934   NA 128 (L) 02/17/2018 0934   NA 129 (L) 01/19/2018 0959   K 4.8 02/17/2018 0934   CL 96 02/17/2018 0934   CO2 24 02/17/2018 0934   GLUCOSE 115 (H) 02/17/2018 0934   BUN 18 02/17/2018 0934   BUN 16 01/19/2018 0959   CREATININE 0.66 02/17/2018 0934   CALCIUM 9.4 02/17/2018 0934   GFRNONAA 79 01/19/2018 0959   GFRAA 91 01/19/2018 0959   Lab Results  Component Value Date   HGBA1C 6.6 (H) 01/19/2018   HGBA1C 6.2 (A) 11/25/2017   HGBA1C 6.4 05/23/2017   HGBA1C 6.7 (H) 01/02/2017   HGBA1C 6.6 (H) 08/21/2016   Lab Results  Component Value Date   INSULIN 12.1 01/19/2018   CBC    Component Value Date/Time   WBC 9.1 02/17/2018 0934   RBC 4.29 02/17/2018 0934   HGB 11.4 (L) 02/17/2018 0934   HGB 11.7 01/19/2018 0959   HCT 34.8 (L) 02/17/2018 0934   HCT 35.7 01/19/2018 0959   PLT 384.0 02/17/2018 0934   PLT 358 04/24/2017 1231   MCV 81.1 02/17/2018 0934   MCV 81 01/19/2018 0959   MCH 26.5 (L) 01/19/2018 0959   MCH 27.4 08/26/2016 0248   MCHC 32.7 02/17/2018 0934   RDW 14.5 02/17/2018 0934   RDW 14.9 01/19/2018 0959   LYMPHSABS 2.9 01/19/2018 0959   MONOABS 0.6 05/11/2015 0836   EOSABS 0.1 01/19/2018 0959   BASOSABS 0.0 01/19/2018 0959   Iron/TIBC/Ferritin/ %Sat No results found for: IRON, TIBC, FERRITIN, IRONPCTSAT Lipid Panel     Component Value Date/Time   CHOL 123 01/19/2018 0959   TRIG 76 01/19/2018 0959   HDL 54 01/19/2018 0959  CHOLHDL 3 05/23/2017 0840   VLDL 19.6 05/23/2017 0840   LDLCALC 54 01/19/2018 0959   LDLDIRECT 58.0 11/25/2017 1156   Hepatic Function Panel     Component  Value Date/Time   PROT 6.7 02/17/2018 0934   PROT 6.6 01/19/2018 0959   ALBUMIN 4.5 02/17/2018 0934   ALBUMIN 4.6 01/19/2018 0959   AST 24 02/17/2018 0934   ALT 13 02/17/2018 0934   ALKPHOS 144 (H) 02/17/2018 0934   BILITOT 0.4 02/17/2018 0934   BILITOT 0.5 01/19/2018 0959   BILIDIR 0.1 05/11/2015 0836      Component Value Date/Time   TSH 5.300 (H) 01/19/2018 0959   TSH 3.390 04/24/2017 1231   TSH 3.340 09/16/2016 1552    Ref. Range 01/19/2018 09:59  Vitamin D, 25-Hydroxy Latest Ref Range: 30.0 - 100.0 ng/mL 27.9 (L)    ASSESSMENT AND PLAN: Type 2 diabetes mellitus without complication, without long-term current use of insulin (HCC)  Vitamin D deficiency - Plan: Vitamin D, Ergocalciferol, (DRISDOL) 50000 units CAPS capsule  Class 2 severe obesity with serious comorbidity and body mass index (BMI) of 35.0 to 35.9 in adult, unspecified obesity type ([place])  PLAN: Diabetes II [name] has been given extensive diabetes education by myself today including ideal fasting and post-prandial blood glucose readings, individual ideal HgA1c goals  and hypoglycemia prevention. We discussed the importance of good blood sugar control to decrease the likelihood of diabetic complications such as nephropathy, neuropathy, limb loss, blindness, coronary artery disease, and death. We discussed the importance of intensive lifestyle modification including diet, exercise and weight loss as the first line treatment for diabetes. [name] agrees to continue her diabetes medications and will follow up at the agreed upon time.  Vitamin D Deficiency [name] was informed that low vitamin D levels contributes to fatigue and are associated with obesity, breast, and colon cancer. She agrees to continue to take prescription Vit D @50 ,000 IU every week #4 with no refills and will follow up for routine testing of vitamin D, at least 2-3 times per year. She was informed of the risk of over-replacement of vitamin D  and agrees to not increase her dose unless she discusses this with [name] first.  Obesity [name] is currently in the action stage of change. As such, her goal is to continue with weight loss efforts She has agreed to follow the Category 3 plan [name] has been instructed to work up to a goal of 150 minutes of combined cardio and strengthening exercise per week for weight loss and overall health benefits. We discussed the following Behavioral Modification Stratagies today: work on meal planning and easy cooking plans and keeping healthy foods in the home.    [name] has agreed to follow up with our clinic in 2 weeks. She was informed of the importance of frequent follow up visits to maximize her success with intensive lifestyle modifications for her multiple health conditions.   OBESITY BEHAVIORAL INTERVENTION VISIT  Today's visit was # 3   Starting weight: 220 lbs Starting date: 01/19/18 Today's weight :216 lbs Today's date: 02/17/18 Total lbs lost to date: 4 lbs At least 15 minutes were spent on discussing the following behavioral intervention visit.   ASK: We discussed the diagnosis of obesity with [name] today and [name] agreed to give [name] permission to discuss obesity behavioral modification therapy today.  ASSESS: Tanajah has the diagnosis of obesity and her BMI today is 35.94 Yaris is in the action stage of change   ADVISE: Tvisha  was educated on the multiple health risks of obesity as well as the benefit of weight loss to improve her health. She was advised of the need for long term treatment and the importance of lifestyle modifications to improve her current health and to decrease her risk of future health problems.  AGREE: Multiple dietary modification options and treatment options were discussed and  Caliah agreed to follow the recommendations documented in the above note.  ARRANGE: [place] was educated on the importance of  frequent visits to treat obesity as outlined per CMS and USPSTF guidelines and agreed to schedule her next follow up appointment today.  Hayley Roca, LPN, am acting as transcriptionist for [name], PA-C I, [name], PA-C have reviewed above note and agree with its content

## 2018-02-19 LAB — OSMOLALITY: Osmolality: 277 mOsm/kg — ABNORMAL LOW (ref 278–305)

## 2018-02-20 LAB — TEST AUTHORIZATION 2

## 2018-02-20 LAB — SODIUM, URINE, RANDOM: Sodium, Ur: <10 mmol/L — ABNORMAL LOW (ref 28–272)

## 2018-02-20 LAB — OSMOLALITY, URINE: Osmolality, Ur: 198 mOsm/kg (ref 50–1200)

## 2018-02-21 ENCOUNTER — Other Ambulatory Visit: Payer: Self-pay | Admitting: Family Medicine

## 2018-02-21 MED ORDER — VENLAFAXINE HCL ER 75 MG PO CP24: 75.0000 mg | ORAL_CAPSULE | Freq: Every day | ORAL | 5 refills | Status: DC

## 2018-02-24 ENCOUNTER — Other Ambulatory Visit (INDEPENDENT_AMBULATORY_CARE_PROVIDER_SITE_OTHER): Payer: Self-pay | Admitting: Family Medicine

## 2018-02-24 DIAGNOSIS — E559 Vitamin D deficiency, unspecified: Secondary | ICD-10-CM

## 2018-02-24 NOTE — Progress Notes (Addendum)
Pt called back. Pt aware too soon for bone density.  I have rescheduled pt for 05/26/18.  Nothing further needed.

## 2018-02-24 NOTE — Progress Notes (Signed)
Received call from Mardene Celeste in [place] at [facility].  Pt scheduled for bone density scan for tomorrow 9/4.  Last dexa was done 05/23/16.  Insurance will not cover if done before 11/30.  Dr. Yong Channel aware and is fine with her bone density scan being r/s after 11/30 but before 12/12 which is her next follow up appt w/Dr. Yong Channel.

## 2018-02-25 ENCOUNTER — Inpatient Hospital Stay: Admission: RE | Admit: 2018-02-25 | Payer: Medicare Other | Source: Ambulatory Visit

## 2018-02-26 ENCOUNTER — Other Ambulatory Visit: Payer: Self-pay

## 2018-02-26 ENCOUNTER — Encounter: Payer: Self-pay | Admitting: Family Medicine

## 2018-02-26 MED ORDER — ONETOUCH ULTRASOFT LANCETS MISC: 12 refills | Status: DC

## 2018-03-03 ENCOUNTER — Ambulatory Visit (INDEPENDENT_AMBULATORY_CARE_PROVIDER_SITE_OTHER): Payer: Medicare Other | Admitting: Physician Assistant

## 2018-03-03 ENCOUNTER — Other Ambulatory Visit: Payer: Self-pay

## 2018-03-03 VITALS — BP 133/49 | HR 59 | Temp 98.2°F | Ht 65.0 in | Wt 213.0 lb

## 2018-03-03 DIAGNOSIS — E119 Type 2 diabetes mellitus without complications: Secondary | ICD-10-CM

## 2018-03-03 DIAGNOSIS — Z6835 Body mass index (BMI) 35.0-35.9, adult: Secondary | ICD-10-CM | POA: Diagnosis not present

## 2018-03-03 DIAGNOSIS — E871 Hypo-osmolality and hyponatremia: Secondary | ICD-10-CM

## 2018-03-04 ENCOUNTER — Encounter (INDEPENDENT_AMBULATORY_CARE_PROVIDER_SITE_OTHER): Payer: Self-pay | Admitting: Physician Assistant

## 2018-03-04 NOTE — Progress Notes (Signed)
Office: [phone]  /  Fax: [phone]   HPI:   Chief Complaint: OBESITY [name] is here to discuss her progress with her obesity treatment plan. She is on the Category 3 plan and is following her eating plan approximately 75-80 % of the time. She states she is exercising 0 minutes 0 times per week. [name] did well with weight loss. She reports that she has been trying to get all the food in, but has a hard time because of nausea associated with Victoza.  Her weight is 213 lb (96.6 kg) today and has had a weight loss of 3 pounds over a period of 2 weeks since her last visit. She has lost 7 lbs since starting treatment with [name].  Diabetes II [name] has a diagnosis of diabetes type II. [name] states fasting BGs range between 102 and 132 and she denies any hypoglycemic episodes. She is on Victoza, which is causing nausea. Last A1c was 6.6. She has been working on intensive lifestyle modifications including diet, exercise, and weight loss to help control her blood glucose levels.  ALLERGIES: Allergies  Allergen Reactions  . Augmentin [Amoxicillin-Pot Clavulanate] Rash    rash  . Erythromycin Nausea Only  . Penicillins Itching and Rash    Has patient had a PCN reaction causing immediate rash, facial/tongue/throat swelling, SOB or lightheadedness with hypotension: No Has patient had a PCN reaction causing severe rash involving mucus membranes or skin necrosis: No Has patient had a PCN reaction that required hospitalization: No Has patient had a PCN reaction occurring within the last 10 years: No  If all of the above answers are "NO", then may proceed with Cephalosporin use.     MEDICATIONS: Current Outpatient Medications on File Prior to Visit  Medication Sig Dispense Refill  . atorvastatin (LIPITOR) 40 MG tablet Take 1 tablet (40 mg total) by mouth daily at 6 PM. 90 tablet 3  . Biotin 5000 MCG TABS Take 5,000 mcg by mouth daily.     . calcium-vitamin D (OSCAL WITH D)  500-200 MG-UNIT per tablet Take 2 tablets by mouth daily.     . clindamycin (CLEOCIN) 300 MG capsule TAKE 2 CAPSULES BY MOUTH 1 HOUR PRIOR TOO DENTAL CLEANING. 2 capsule 0  . clopidogrel (PLAVIX) 75 MG tablet Take 1 tablet (75 mg total) by mouth daily. 90 tablet 3  . fluticasone (FLONASE) 50 MCG/ACT nasal spray Place 2 sprays into both nostrils daily.     [name] glucose blood (ONETOUCH VERIO) test strip Use to test blood sugars daily. Dx: E11.9 100 each 12  . Insulin Pen Needle (BD PEN NEEDLE NANO 2ND GEN) 32G X 4 MM MISC 1 Package by Does not apply route 2 (two) times daily. 100 each 0  . Lancets (ONETOUCH ULTRASOFT) lancets Use to test blood sugars daily. Dx: E11.9 100 each 12  . loratadine (CLARITIN) 10 MG tablet Take 10 mg by mouth at bedtime.     . metFORMIN (GLUCOPHAGE) 500 MG tablet TAKE 1 TABLET WITH BREAKFAST AND 1/2 TO 1 TABLET WITH DINNER. 180 tablet 1  . metoCLOPramide (REGLAN) 10 MG tablet TAKE 1/2 TABLET BY MOUTH 4 TIMES A DAY 60 tablet 5  . metoprolol tartrate (LOPRESSOR) 25 MG tablet Take 0.5 tablets (12.5 mg total) by mouth 2 (two) times daily. 60 tablet 11  . Multiple Vitamin (MULTIVITAMIN WITH MINERALS) TABS tablet Take 1 tablet by mouth daily.    [name] omeprazole (PRILOSEC OTC) 20 MG tablet Take 40 mg by mouth daily with breakfast.    .  potassium chloride SA (K-DUR,KLOR-CON) 20 MEQ tablet Take 1 tablet (20 mEq total) by mouth daily. 60 tablet 11  . Probiotic Product (ALIGN) 4 MG CAPS Take 4 mg by mouth daily.     . psyllium (METAMUCIL SMOOTH TEXTURE) 28 % packet Take 1 packet by mouth daily before breakfast.     . venlafaxine XR (EFFEXOR-XR) 75 MG 24 hr capsule Take 1 capsule (75 mg total) by mouth daily with breakfast. 30 capsule 5  . Vitamin D, Ergocalciferol, (DRISDOL) 50000 units CAPS capsule Take 1 capsule (50,000 Units total) by mouth every 7 (seven) days. 4 capsule 0  . Vitamin D, Ergocalciferol, (DRISDOL) 50000 units CAPS capsule TAKE 1 CAPSULE (50,000 UNITS TOTAL) BY MOUTH EVERY  7 (SEVEN) DAYS. 4 capsule 0   No current facility-administered medications on file prior to visit.     PAST MEDICAL HISTORY: Past Medical History:  Diagnosis Date  . Allergy   . Anxiety   . Arthritis    back & knee  . Asthma     mild per pt shows up with resp illness  . Chronic diastolic congestive heart failure ([place])   . Colon polyps   . Constipation   . Diabetes mellitus without complication ([place])   . Dyspnea   . Gastric polyps   . Gastroparesis   . GERD (gastroesophageal reflux disease)   . Headache    sinus headaches and migraines at times  . Heart murmur   . History of migraine headaches   . HTN (hypertension)   . Hyperlipidemia   . IBS (irritable bowel syndrome)   . Joint pain   . Lumbar disc disease   . S/P aortic valve replacement with bioprosthetic valve 08/23/2016   25 mm Edwards Intuity rapid-deployment bovine pericardial tissue valve via partial upper mini sternotomy  . Sleep apnea   . TIA (transient ischemic attack)     PAST SURGICAL HISTORY: Past Surgical History:  Procedure Laterality Date  . ABDOMINAL HYSTERECTOMY    . AORTIC VALVE REPLACEMENT N/A 08/23/2016   Procedure: AORTIC VALVE REPLACEMENT (AVR) - using partial Upper Sternotomy- 57mm Edwards Intuity Aortic Valve used;  Surgeon: Rexene Alberts, MD;  Location: [facility];  Service: Open Heart Surgery;  Laterality: N/A;  . BLADDER SUSPENSION  2007  . BREAST ENHANCEMENT SURGERY  1980  . CARPAL TUNNEL RELEASE Bilateral   . COLONOSCOPY    . DORSAL COMPARTMENT RELEASE Right 09/15/2014   Procedure: RIGHT WRIST DEQUERVAINS RELEASE ;  Surgeon: Kathryne Hitch, MD;  Location: [facility];  Service: Orthopedics;  Laterality: Right;  . ESOPHAGOGASTRODUODENOSCOPY    . KNEE ARTHROSCOPY  04/15/2012   Procedure: ARTHROSCOPY KNEE;  Surgeon: [name], MD;  Location: [facility];  Service: Orthopedics;  Laterality: Right;  . LAPAROSCOPIC CHOLECYSTECTOMY    . LASIK    . OOPHORECTOMY    . PALATE TO GINGIVA  GRAFT  2017  . RIGHT/LEFT HEART CATH AND CORONARY ANGIOGRAPHY N/A 08/02/2016   Procedure: Right/Left Heart Cath and Coronary Angiography;  Surgeon: Sherren Mocha, MD;  Location: [facility];  Service: Cardiovascular;  Laterality: N/A;  . ROTATOR CUFF REPAIR Left   . TEE WITHOUT CARDIOVERSION N/A 08/23/2016   Procedure: TRANSESOPHAGEAL ECHOCARDIOGRAM (TEE);  Surgeon: Rexene Alberts, MD;  Location: [facility];  Service: Open Heart Surgery;  Laterality: N/A;  . TOTAL KNEE ARTHROPLASTY  04/15/2012   Procedure: TOTAL KNEE ARTHROPLASTY;  Surgeon: [name], MD;  Location: [facility];  Service: Orthopedics;  Laterality:  Right;  [name] TRIGGER FINGER RELEASE Right 04/20/2015   Procedure: RIGHT TRIGGER FINGER RELEASE (TENDON SHEALTH INCISION) ;  Surgeon: [name], MD;  Location: [facility];  Service: Orthopedics;  Laterality: Right;    SOCIAL HISTORY: Social History   Tobacco Use  . Smoking status: Never Smoker  . Smokeless tobacco: Never Used  Substance Use Topics  . Alcohol use: No  . Drug use: No    FAMILY HISTORY: Family History  Problem Relation Age of Onset  . COPD Mother   . Colon polyps Mother   . Irritable bowel syndrome Mother   . Anxiety disorder Mother   . Heart disease Father   . Alcohol abuse Father   . Colon polyps Maternal Aunt     ROS: Review of Systems  Constitutional: Positive for weight loss.  Gastrointestinal: Positive for nausea.  Endo/Heme/Allergies:       Negative hypoglycemia    PHYSICAL EXAM: Blood pressure (!) 133/49, pulse (!) 59, temperature 98.2 F (36.8 C), temperature source Oral, height 5\' 5"  (1.651 m), weight 213 lb (96.6 kg), SpO2 99 %. Body mass index is 35.45 kg/m. Physical Exam  Constitutional: She is oriented to person, place, and time. She appears well-developed and well-nourished.  Cardiovascular: Normal rate.  Pulmonary/Chest: Effort normal.  Musculoskeletal: Normal range of motion.  Neurological: She is oriented  to person, place, and time.  Skin: Skin is warm and dry.  Psychiatric: She has a normal mood and affect. Her behavior is normal.  Vitals reviewed.   RECENT LABS AND TESTS: BMET    Component Value Date/Time   NA 128 (L) 02/17/2018 0934   NA 128 (L) 02/17/2018 0934   NA 129 (L) 01/19/2018 0959   K 4.8 02/17/2018 0934   CL 96 02/17/2018 0934   CO2 24 02/17/2018 0934   GLUCOSE 115 (H) 02/17/2018 0934   BUN 18 02/17/2018 0934   BUN 16 01/19/2018 0959   CREATININE 0.66 02/17/2018 0934   CALCIUM 9.4 02/17/2018 0934   GFRNONAA 79 01/19/2018 0959   GFRAA 91 01/19/2018 0959   Lab Results  Component Value Date   HGBA1C 6.6 (H) 01/19/2018   HGBA1C 6.2 (A) 11/25/2017   HGBA1C 6.4 05/23/2017   HGBA1C 6.7 (H) 01/02/2017   HGBA1C 6.6 (H) 08/21/2016   Lab Results  Component Value Date   INSULIN 12.1 01/19/2018   CBC    Component Value Date/Time   WBC 9.1 02/17/2018 0934   RBC 4.29 02/17/2018 0934   HGB 11.4 (L) 02/17/2018 0934   HGB 11.7 01/19/2018 0959   HCT 34.8 (L) 02/17/2018 0934   HCT 35.7 01/19/2018 0959   PLT 384.0 02/17/2018 0934   PLT 358 04/24/2017 1231   MCV 81.1 02/17/2018 0934   MCV 81 01/19/2018 0959   MCH 26.5 (L) 01/19/2018 0959   MCH 27.4 08/26/2016 0248   MCHC 32.7 02/17/2018 0934   RDW 14.5 02/17/2018 0934   RDW 14.9 01/19/2018 0959   LYMPHSABS 2.9 01/19/2018 0959   MONOABS 0.6 05/11/2015 0836   EOSABS 0.1 01/19/2018 0959   BASOSABS 0.0 01/19/2018 0959   Iron/TIBC/Ferritin/ %Sat No results found for: IRON, TIBC, FERRITIN, IRONPCTSAT Lipid Panel     Component Value Date/Time   CHOL 123 01/19/2018 0959   TRIG 76 01/19/2018 0959   HDL 54 01/19/2018 0959   CHOLHDL 3 05/23/2017 0840   VLDL 19.6 05/23/2017 0840   LDLCALC 54 01/19/2018 0959   LDLDIRECT 58.0 11/25/2017 1156   Hepatic Function  Panel     Component Value Date/Time   PROT 6.7 02/17/2018 0934   PROT 6.6 01/19/2018 0959   ALBUMIN 4.5 02/17/2018 0934   ALBUMIN 4.6 01/19/2018 0959    AST 24 02/17/2018 0934   ALT 13 02/17/2018 0934   ALKPHOS 144 (H) 02/17/2018 0934   BILITOT 0.4 02/17/2018 0934   BILITOT 0.5 01/19/2018 0959   BILIDIR 0.1 05/11/2015 0836      Component Value Date/Time   TSH 5.300 (H) 01/19/2018 0959   TSH 3.390 04/24/2017 1231   TSH 3.340 09/16/2016 1552    ASSESSMENT AND PLAN: Type 2 diabetes mellitus without complication, without long-term current use of insulin (HCC)  Class 2 severe obesity with serious comorbidity and body mass index (BMI) of 35.0 to 35.9 in adult, unspecified obesity type ([place])  PLAN:  Diabetes II [name] has been given extensive diabetes education by myself today including ideal fasting and post-prandial blood glucose readings, individual ideal Hgb A1c goals and hypoglycemia prevention. We discussed the importance of good blood sugar control to decrease the likelihood of diabetic complications such as nephropathy, neuropathy, limb loss, blindness, coronary artery disease, and death. We discussed the importance of intensive lifestyle modification including diet, exercise and weight loss as the first line treatment for diabetes. [name] agrees to discontinue Victoza due to nausea on the lowest dose. [name] agrees to follow up with our clinic in 2 weeks.  Obesity [name] is currently in the action stage of change. As such, her goal is to continue with weight loss efforts She has agreed to follow the Category 3 plan [name] has been instructed to work up to a goal of 150 minutes of combined cardio and strengthening exercise per week for weight loss and overall health benefits. We discussed the following Behavioral Modification Strategies today: work on meal planning and easy cooking plans and ways to avoid boredom eating   [name] has agreed to follow up with our clinic in 2 weeks. She was informed of the importance of frequent follow up visits to maximize her success with intensive lifestyle modifications for  her multiple health conditions.   OBESITY BEHAVIORAL INTERVENTION VISIT  Today's visit was # 4   Starting weight: 220 lbs Starting date: 01/19/18 Today's weight : 213 lbs Today's date: 03/03/2018 Total lbs lost to date: 7 At least 15 minutes were spent on discussing the following behavioral intervention visit.   ASK: We discussed the diagnosis of obesity with [name] today and [name] agreed to give [name] permission to discuss obesity behavioral modification therapy today.  ASSESS: Skiler has the diagnosis of obesity and her BMI today is 35.44 Janeene is in the action stage of change   ADVISE: [name] was educated on the multiple health risks of obesity as well as the benefit of weight loss to improve her health. She was advised of the need for long term treatment and the importance of lifestyle modifications.  AGREE: Multiple dietary modification options and treatment options were discussed and  [name] agreed to the above obesity treatment plan.  Wilhemena Durie, am acting as transcriptionist for [name], PA-C I, [name], PA-C have reviewed above note and agree with its content

## 2018-03-17 ENCOUNTER — Ambulatory Visit (INDEPENDENT_AMBULATORY_CARE_PROVIDER_SITE_OTHER): Payer: Medicare Other | Admitting: Physician Assistant

## 2018-03-17 VITALS — BP 146/67 | HR 53 | Temp 98.3°F | Ht 65.0 in | Wt 211.0 lb

## 2018-03-17 DIAGNOSIS — E119 Type 2 diabetes mellitus without complications: Secondary | ICD-10-CM

## 2018-03-17 DIAGNOSIS — Z6835 Body mass index (BMI) 35.0-35.9, adult: Secondary | ICD-10-CM | POA: Diagnosis not present

## 2018-03-18 NOTE — Progress Notes (Signed)
Office: [phone]  /  Fax: [phone]   HPI:   Chief Complaint: OBESITY [name] is here to discuss her progress with her obesity treatment plan. She is on the Category 3 plan and is following her eating plan approximately 95 % of the time. She states she is cleaning and moving things for 2 hours 2-3 times per week. [name] did well with weight loss. She reports that her house is being painted and they are doing some remodeling, so she is getting in exercise moving items in her house.  Her weight is 211 lb (95.7 kg) today and has had a weight loss of 2 pounds over a period of 2 weeks since her last visit. She has lost 9 lbs since starting treatment with [name].  Diabetes II [name] has a diagnosis of diabetes type II. [name]'s BGs log notes fasting BGs range between 109 and 129. She is on metformin and denies hypoglycemia or polyphagia. Last A1c was 6.6. She has been working on intensive lifestyle modifications including diet, exercise, and weight loss to help control her blood glucose levels.  ALLERGIES: Allergies  Allergen Reactions  . Augmentin [Amoxicillin-Pot Clavulanate] Rash    rash  . Erythromycin Nausea Only  . Penicillins Itching and Rash    Has patient had a PCN reaction causing immediate rash, facial/tongue/throat swelling, SOB or lightheadedness with hypotension: No Has patient had a PCN reaction causing severe rash involving mucus membranes or skin necrosis: No Has patient had a PCN reaction that required hospitalization: No Has patient had a PCN reaction occurring within the last 10 years: No  If all of the above answers are "NO", then may proceed with Cephalosporin use.     MEDICATIONS: Current Outpatient Medications on File Prior to Visit  Medication Sig Dispense Refill  . atorvastatin (LIPITOR) 40 MG tablet Take 1 tablet (40 mg total) by mouth daily at 6 PM. 90 tablet 3  . Biotin 5000 MCG TABS Take 5,000 mcg by mouth daily.     . calcium-vitamin D  (OSCAL WITH D) 500-200 MG-UNIT per tablet Take 2 tablets by mouth daily.     . clindamycin (CLEOCIN) 300 MG capsule TAKE 2 CAPSULES BY MOUTH 1 HOUR PRIOR TOO DENTAL CLEANING. 2 capsule 0  . clopidogrel (PLAVIX) 75 MG tablet Take 1 tablet (75 mg total) by mouth daily. 90 tablet 3  . fluticasone (FLONASE) 50 MCG/ACT nasal spray Place 2 sprays into both nostrils daily.     [name] glucose blood (ONETOUCH VERIO) test strip Use to test blood sugars daily. Dx: E11.9 100 each 12  . Insulin Pen Needle (BD PEN NEEDLE NANO 2ND GEN) 32G X 4 MM MISC 1 Package by Does not apply route 2 (two) times daily. 100 each 0  . Lancets (ONETOUCH ULTRASOFT) lancets Use to test blood sugars daily. Dx: E11.9 100 each 12  . loratadine (CLARITIN) 10 MG tablet Take 10 mg by mouth at bedtime.     . metFORMIN (GLUCOPHAGE) 500 MG tablet TAKE 1 TABLET WITH BREAKFAST AND 1/2 TO 1 TABLET WITH DINNER. 180 tablet 1  . metoCLOPramide (REGLAN) 10 MG tablet TAKE 1/2 TABLET BY MOUTH 4 TIMES A DAY 60 tablet 5  . metoprolol tartrate (LOPRESSOR) 25 MG tablet Take 0.5 tablets (12.5 mg total) by mouth 2 (two) times daily. 60 tablet 11  . Multiple Vitamin (MULTIVITAMIN WITH MINERALS) TABS tablet Take 1 tablet by mouth daily.    [name] omeprazole (PRILOSEC OTC) 20 MG tablet Take 40 mg by mouth daily  with breakfast.    . potassium chloride SA (K-DUR,KLOR-CON) 20 MEQ tablet Take 1 tablet (20 mEq total) by mouth daily. 60 tablet 11  . Probiotic Product (ALIGN) 4 MG CAPS Take 4 mg by mouth daily.     . psyllium (METAMUCIL SMOOTH TEXTURE) 28 % packet Take 1 packet by mouth daily before breakfast.     . venlafaxine XR (EFFEXOR-XR) 75 MG 24 hr capsule Take 1 capsule (75 mg total) by mouth daily with breakfast. 30 capsule 5  . Vitamin D, Ergocalciferol, (DRISDOL) 50000 units CAPS capsule Take 1 capsule (50,000 Units total) by mouth every 7 (seven) days. 4 capsule 0  . Vitamin D, Ergocalciferol, (DRISDOL) 50000 units CAPS capsule TAKE 1 CAPSULE (50,000 UNITS TOTAL)  BY MOUTH EVERY 7 (SEVEN) DAYS. 4 capsule 0   No current facility-administered medications on file prior to visit.     PAST MEDICAL HISTORY: Past Medical History:  Diagnosis Date  . Allergy   . Anxiety   . Arthritis    back & knee  . Asthma     mild per pt shows up with resp illness  . Chronic diastolic congestive heart failure ([place])   . Colon polyps   . Constipation   . Diabetes mellitus without complication ([place])   . Dyspnea   . Gastric polyps   . Gastroparesis   . GERD (gastroesophageal reflux disease)   . Headache    sinus headaches and migraines at times  . Heart murmur   . History of migraine headaches   . HTN (hypertension)   . Hyperlipidemia   . IBS (irritable bowel syndrome)   . Joint pain   . Lumbar disc disease   . S/P aortic valve replacement with bioprosthetic valve 08/23/2016   25 mm Edwards Intuity rapid-deployment bovine pericardial tissue valve via partial upper mini sternotomy  . Sleep apnea   . TIA (transient ischemic attack)     PAST SURGICAL HISTORY: Past Surgical History:  Procedure Laterality Date  . ABDOMINAL HYSTERECTOMY    . AORTIC VALVE REPLACEMENT N/A 08/23/2016   Procedure: AORTIC VALVE REPLACEMENT (AVR) - using partial Upper Sternotomy- 44mm Edwards Intuity Aortic Valve used;  Surgeon: Rexene Alberts, MD;  Location: [facility];  Service: Open Heart Surgery;  Laterality: N/A;  . BLADDER SUSPENSION  2007  . BREAST ENHANCEMENT SURGERY  1980  . CARPAL TUNNEL RELEASE Bilateral   . COLONOSCOPY    . DORSAL COMPARTMENT RELEASE Right 09/15/2014   Procedure: RIGHT WRIST DEQUERVAINS RELEASE ;  Surgeon: Kathryne Hitch, MD;  Location: [facility];  Service: Orthopedics;  Laterality: Right;  . ESOPHAGOGASTRODUODENOSCOPY    . KNEE ARTHROSCOPY  04/15/2012   Procedure: ARTHROSCOPY KNEE;  Surgeon: [name], MD;  Location: [facility];  Service: Orthopedics;  Laterality: Right;  . LAPAROSCOPIC CHOLECYSTECTOMY    . LASIK    . OOPHORECTOMY    .  PALATE TO GINGIVA GRAFT  2017  . RIGHT/LEFT HEART CATH AND CORONARY ANGIOGRAPHY N/A 08/02/2016   Procedure: Right/Left Heart Cath and Coronary Angiography;  Surgeon: Sherren Mocha, MD;  Location: [facility];  Service: Cardiovascular;  Laterality: N/A;  . ROTATOR CUFF REPAIR Left   . TEE WITHOUT CARDIOVERSION N/A 08/23/2016   Procedure: TRANSESOPHAGEAL ECHOCARDIOGRAM (TEE);  Surgeon: Rexene Alberts, MD;  Location: [facility];  Service: Open Heart Surgery;  Laterality: N/A;  . TOTAL KNEE ARTHROPLASTY  04/15/2012   Procedure: TOTAL KNEE ARTHROPLASTY;  Surgeon: [name], MD;  Location: [facility]  OR;  Service: Orthopedics;  Laterality: Right;  . TRIGGER FINGER RELEASE Right 04/20/2015   Procedure: RIGHT TRIGGER FINGER RELEASE (TENDON SHEALTH INCISION) ;  Surgeon: [name], MD;  Location: [facility];  Service: Orthopedics;  Laterality: Right;    SOCIAL HISTORY: Social History   Tobacco Use  . Smoking status: Never Smoker  . Smokeless tobacco: Never Used  Substance Use Topics  . Alcohol use: No  . Drug use: No    FAMILY HISTORY: Family History  Problem Relation Age of Onset  . COPD Mother   . Colon polyps Mother   . Irritable bowel syndrome Mother   . Anxiety disorder Mother   . Heart disease Father   . Alcohol abuse Father   . Colon polyps Maternal Aunt     ROS: Review of Systems  Constitutional: Positive for weight loss.  Endo/Heme/Allergies:       Negative hypoglycemia Negative polyphagia    PHYSICAL EXAM: Blood pressure (!) 146/67, pulse (!) 53, temperature 98.3 F (36.8 C), temperature source Oral, height 5\' 5"  (1.651 m), weight 211 lb (95.7 kg), SpO2 100 %. Body mass index is 35.11 kg/m. Physical Exam  Constitutional: She is oriented to person, place, and time. She appears well-developed and well-nourished.  Cardiovascular: Normal rate.  Pulmonary/Chest: Effort normal.  Musculoskeletal: Normal range of motion.  Neurological: She is oriented  to person, place, and time.  Skin: Skin is warm and dry.  Psychiatric: She has a normal mood and affect. Her behavior is normal.  Vitals reviewed.   RECENT LABS AND TESTS: BMET    Component Value Date/Time   NA 128 (L) 02/17/2018 0934   NA 128 (L) 02/17/2018 0934   NA 129 (L) 01/19/2018 0959   K 4.8 02/17/2018 0934   CL 96 02/17/2018 0934   CO2 24 02/17/2018 0934   GLUCOSE 115 (H) 02/17/2018 0934   BUN 18 02/17/2018 0934   BUN 16 01/19/2018 0959   CREATININE 0.66 02/17/2018 0934   CALCIUM 9.4 02/17/2018 0934   GFRNONAA 79 01/19/2018 0959   GFRAA 91 01/19/2018 0959   Lab Results  Component Value Date   HGBA1C 6.6 (H) 01/19/2018   HGBA1C 6.2 (A) 11/25/2017   HGBA1C 6.4 05/23/2017   HGBA1C 6.7 (H) 01/02/2017   HGBA1C 6.6 (H) 08/21/2016   Lab Results  Component Value Date   INSULIN 12.1 01/19/2018   CBC    Component Value Date/Time   WBC 9.1 02/17/2018 0934   RBC 4.29 02/17/2018 0934   HGB 11.4 (L) 02/17/2018 0934   HGB 11.7 01/19/2018 0959   HCT 34.8 (L) 02/17/2018 0934   HCT 35.7 01/19/2018 0959   PLT 384.0 02/17/2018 0934   PLT 358 04/24/2017 1231   MCV 81.1 02/17/2018 0934   MCV 81 01/19/2018 0959   MCH 26.5 (L) 01/19/2018 0959   MCH 27.4 08/26/2016 0248   MCHC 32.7 02/17/2018 0934   RDW 14.5 02/17/2018 0934   RDW 14.9 01/19/2018 0959   LYMPHSABS 2.9 01/19/2018 0959   MONOABS 0.6 05/11/2015 0836   EOSABS 0.1 01/19/2018 0959   BASOSABS 0.0 01/19/2018 0959   Iron/TIBC/Ferritin/ %Sat No results found for: IRON, TIBC, FERRITIN, IRONPCTSAT Lipid Panel     Component Value Date/Time   CHOL 123 01/19/2018 0959   TRIG 76 01/19/2018 0959   HDL 54 01/19/2018 0959   CHOLHDL 3 05/23/2017 0840   VLDL 19.6 05/23/2017 0840   LDLCALC 54 01/19/2018 0959   LDLDIRECT 58.0 11/25/2017 1156  Hepatic Function Panel     Component Value Date/Time   PROT 6.7 02/17/2018 0934   PROT 6.6 01/19/2018 0959   ALBUMIN 4.5 02/17/2018 0934   ALBUMIN 4.6 01/19/2018 0959    AST 24 02/17/2018 0934   ALT 13 02/17/2018 0934   ALKPHOS 144 (H) 02/17/2018 0934   BILITOT 0.4 02/17/2018 0934   BILITOT 0.5 01/19/2018 0959   BILIDIR 0.1 05/11/2015 0836      Component Value Date/Time   TSH 5.300 (H) 01/19/2018 0959   TSH 3.390 04/24/2017 1231   TSH 3.340 09/16/2016 1552    ASSESSMENT AND PLAN: Type 2 diabetes mellitus without complication, without long-term current use of insulin (HCC)  Class 2 severe obesity with serious comorbidity and body mass index (BMI) of 35.0 to 35.9 in adult, unspecified obesity type ([place])  PLAN:  Diabetes II [name] has been given extensive diabetes education by myself today including ideal fasting and post-prandial blood glucose readings, individual ideal Hgb A1c goals and hypoglycemia prevention. We discussed the importance of good blood sugar control to decrease the likelihood of diabetic complications such as nephropathy, neuropathy, limb loss, blindness, coronary artery disease, and death. We discussed the importance of intensive lifestyle modification including diet, exercise and weight loss as the first line treatment for diabetes. [name] agrees to continue her diabetes medications, diet, exercise, and weight loss. [name] agrees to follow up with our clinic in 2 weeks.  Obesity [name] is currently in the action stage of change. As such, her goal is to continue with weight loss efforts She has agreed to follow the Category 3 plan [name] has been instructed to work up to a goal of 150 minutes of combined cardio and strengthening exercise per week for weight loss and overall health benefits. We discussed the following Behavioral Modification Strategies today: work on meal planning and easy cooking plans and planning for success   [name] has agreed to follow up with our clinic in 2 weeks. She was informed of the importance of frequent follow up visits to maximize her success with intensive lifestyle modifications  for her multiple health conditions.   OBESITY BEHAVIORAL INTERVENTION VISIT  Today's visit was # 5   Starting weight: 220 lbs Starting date: 01/19/18 Today's weight : 211 lbs  Today's date: 03/17/2018 Total lbs lost to date: 9 At least 15 minutes were spent on discussing the following behavioral intervention visit.   ASK: We discussed the diagnosis of obesity with [name] today and [name] agreed to give [name] permission to discuss obesity behavioral modification therapy today.  ASSESS: Akiah has the diagnosis of obesity and her BMI today is 35.11 [name] is in the action stage of change   ADVISE: Petina was educated on the multiple health risks of obesity as well as the benefit of weight loss to improve her health. She was advised of the need for long term treatment and the importance of lifestyle modifications.  AGREE: Multiple dietary modification options and treatment options were discussed and  [name] agreed to the above obesity treatment plan.  Wilhemena Durie, am acting as transcriptionist for [name], PA-C I, [name], PA-C have reviewed above note and agree with its content

## 2018-03-20 ENCOUNTER — Other Ambulatory Visit: Payer: Self-pay | Admitting: Family Medicine

## 2018-03-24 ENCOUNTER — Other Ambulatory Visit (INDEPENDENT_AMBULATORY_CARE_PROVIDER_SITE_OTHER): Payer: Medicare Other

## 2018-03-24 DIAGNOSIS — E871 Hypo-osmolality and hyponatremia: Secondary | ICD-10-CM | POA: Diagnosis not present

## 2018-03-24 LAB — BASIC METABOLIC PANEL
BUN: 19 mg/dL (ref 6–23)
CO2: 26 mEq/L (ref 19–32)
Calcium: 9.3 mg/dL (ref 8.4–10.5)
Chloride: 96 mEq/L (ref 96–112)
Creatinine, Ser: 0.59 mg/dL (ref 0.40–1.20)
GFR: 107.95 mL/min (ref >60.00)
Glucose, Bld: 111 mg/dL — ABNORMAL HIGH (ref 70–99)
Potassium: 4.8 mEq/L (ref 3.5–5.1)
Sodium: 129 mEq/L — ABNORMAL LOW (ref 135–145)

## 2018-03-25 ENCOUNTER — Encounter (INDEPENDENT_AMBULATORY_CARE_PROVIDER_SITE_OTHER): Payer: Self-pay | Admitting: Physician Assistant

## 2018-03-26 ENCOUNTER — Encounter (INDEPENDENT_AMBULATORY_CARE_PROVIDER_SITE_OTHER): Payer: Self-pay | Admitting: Physician Assistant

## 2018-03-26 ENCOUNTER — Other Ambulatory Visit (INDEPENDENT_AMBULATORY_CARE_PROVIDER_SITE_OTHER): Payer: Self-pay | Admitting: Family Medicine

## 2018-03-26 ENCOUNTER — Encounter: Payer: Self-pay | Admitting: Family Medicine

## 2018-03-26 DIAGNOSIS — E119 Type 2 diabetes mellitus without complications: Secondary | ICD-10-CM

## 2018-03-26 LAB — SODIUM, URINE, RANDOM: Sodium, Ur: 18 mmol/L — ABNORMAL LOW (ref 28–272)

## 2018-03-26 LAB — OSMOLALITY: Osmolality: 275 mOsm/kg — ABNORMAL LOW (ref 278–305)

## 2018-03-26 LAB — OSMOLALITY, URINE: Osmolality, Ur: 183 mOsm/kg (ref 50–1200)

## 2018-03-30 ENCOUNTER — Other Ambulatory Visit (INDEPENDENT_AMBULATORY_CARE_PROVIDER_SITE_OTHER): Payer: Self-pay

## 2018-03-30 DIAGNOSIS — E119 Type 2 diabetes mellitus without complications: Secondary | ICD-10-CM

## 2018-03-30 MED ORDER — GLUCOSE BLOOD VI STRP: ORAL_STRIP | 0 refills | Status: DC

## 2018-03-31 ENCOUNTER — Encounter (INDEPENDENT_AMBULATORY_CARE_PROVIDER_SITE_OTHER): Payer: Self-pay | Admitting: Physician Assistant

## 2018-03-31 ENCOUNTER — Ambulatory Visit (INDEPENDENT_AMBULATORY_CARE_PROVIDER_SITE_OTHER): Payer: Medicare Other | Admitting: Physician Assistant

## 2018-03-31 VITALS — BP 146/50 | HR 49 | Temp 97.9°F | Ht 65.0 in | Wt 208.0 lb

## 2018-03-31 DIAGNOSIS — Z6834 Body mass index (BMI) 34.0-34.9, adult: Secondary | ICD-10-CM

## 2018-03-31 DIAGNOSIS — E669 Obesity, unspecified: Secondary | ICD-10-CM | POA: Diagnosis not present

## 2018-03-31 DIAGNOSIS — E119 Type 2 diabetes mellitus without complications: Secondary | ICD-10-CM | POA: Diagnosis not present

## 2018-03-31 DIAGNOSIS — E559 Vitamin D deficiency, unspecified: Secondary | ICD-10-CM

## 2018-03-31 MED ORDER — VITAMIN D (ERGOCALCIFEROL) 1.25 MG (50000 UNIT) PO CAPS: 50000.0000 [IU] | ORAL_CAPSULE | ORAL | 0 refills | Status: DC

## 2018-04-01 NOTE — Progress Notes (Signed)
Office: [phone]  /  Fax: [phone]   HPI:   Chief Complaint: OBESITY [name] is here to discuss her progress with her obesity treatment plan. She is on the Category 3 plan and is following her eating plan approximately 95 % of the time. She states she is exercising 0 minutes 0 times per week. [name] did well with weight loss. She reports being able to eat 10 oz of meat at night and she is following the plan closely.  Her weight is 208 lb (94.3 kg) today and has had a weight loss of 3 pounds over a period of 2 weeks since her last visit. She has lost 12 lbs since starting treatment with [name].  Vitamin D Deficiency [name] has a diagnosis of vitamin D deficiency. She is currently taking prescription Vit D and denies nausea, vomiting or muscle weakness.  Diabetes II [name] has a diagnosis of diabetes type II. [name] states fasting BGs range between 123 and 142 and denies hypoglycemia. She is on metformin and denies nausea, vomiting, or diarrhea. Last A1c was 6.6. She has been working on intensive lifestyle modifications including diet, exercise, and weight loss to help control her blood glucose levels.  ALLERGIES: Allergies  Allergen Reactions  . Augmentin [Amoxicillin-Pot Clavulanate] Rash    rash  . Erythromycin Nausea Only  . Penicillins Itching and Rash    Has patient had a PCN reaction causing immediate rash, facial/tongue/throat swelling, SOB or lightheadedness with hypotension: No Has patient had a PCN reaction causing severe rash involving mucus membranes or skin necrosis: No Has patient had a PCN reaction that required hospitalization: No Has patient had a PCN reaction occurring within the last 10 years: No  If all of the above answers are "NO", then may proceed with Cephalosporin use.     MEDICATIONS: Current Outpatient Medications on File Prior to Visit  Medication Sig Dispense Refill  . atorvastatin (LIPITOR) 40 MG tablet Take 1 tablet (40 mg  total) by mouth daily at 6 PM. 90 tablet 3  . Biotin 5000 MCG TABS Take 5,000 mcg by mouth daily.     . calcium-vitamin D (OSCAL WITH D) 500-200 MG-UNIT per tablet Take 2 tablets by mouth daily.     . clindamycin (CLEOCIN) 300 MG capsule TAKE 2 CAPSULES BY MOUTH 1 HOUR PRIOR TOO DENTAL CLEANING. 2 capsule 0  . clopidogrel (PLAVIX) 75 MG tablet Take 1 tablet (75 mg total) by mouth daily. 90 tablet 3  . fluticasone (FLONASE) 50 MCG/ACT nasal spray Place 2 sprays into both nostrils daily.     [name] glucose blood (ONETOUCH VERIO) test strip Use to test blood sugars daily. Dx: E11.9 100 each 0  . Insulin Pen Needle (BD PEN NEEDLE NANO 2ND GEN) 32G X 4 MM MISC 1 Package by Does not apply route 2 (two) times daily. 100 each 0  . Lancets (ONETOUCH ULTRASOFT) lancets Use to test blood sugars daily. Dx: E11.9 100 each 12  . loratadine (CLARITIN) 10 MG tablet Take 10 mg by mouth at bedtime.     . metFORMIN (GLUCOPHAGE) 500 MG tablet TAKE 1 TABLET WITH BREAKFAST AND 1/2 TO 1 TABLET WITH DINNER. 180 tablet 1  . metoCLOPramide (REGLAN) 10 MG tablet TAKE 1/2 TABLET BY MOUTH 4 TIMES A DAY 60 tablet 5  . metoprolol tartrate (LOPRESSOR) 25 MG tablet Take 0.5 tablets (12.5 mg total) by mouth 2 (two) times daily. 60 tablet 11  . Multiple Vitamin (MULTIVITAMIN WITH MINERALS) TABS tablet Take 1 tablet by  mouth daily.    [name] omeprazole (PRILOSEC OTC) 20 MG tablet Take 40 mg by mouth daily with breakfast.    . potassium chloride SA (K-DUR,KLOR-CON) 20 MEQ tablet Take 1 tablet (20 mEq total) by mouth daily. 60 tablet 11  . Probiotic Product (ALIGN) 4 MG CAPS Take 4 mg by mouth daily.     . psyllium (METAMUCIL SMOOTH TEXTURE) 28 % packet Take 1 packet by mouth daily before breakfast.     . venlafaxine XR (EFFEXOR-XR) 75 MG 24 hr capsule TAKE 1 CAPSULE (75 MG TOTAL) BY MOUTH DAILY WITH BREAKFAST. 90 capsule 2   No current facility-administered medications on file prior to visit.     PAST MEDICAL HISTORY: Past Medical  History:  Diagnosis Date  . Allergy   . Anxiety   . Arthritis    back & knee  . Asthma     mild per pt shows up with resp illness  . Chronic diastolic congestive heart failure ([place])   . Colon polyps   . Constipation   . Diabetes mellitus without complication ([place])   . Dyspnea   . Gastric polyps   . Gastroparesis   . GERD (gastroesophageal reflux disease)   . Headache    sinus headaches and migraines at times  . Heart murmur   . History of migraine headaches   . HTN (hypertension)   . Hyperlipidemia   . IBS (irritable bowel syndrome)   . Joint pain   . Lumbar disc disease   . S/P aortic valve replacement with bioprosthetic valve 08/23/2016   25 mm Edwards Intuity rapid-deployment bovine pericardial tissue valve via partial upper mini sternotomy  . Sleep apnea   . TIA (transient ischemic attack)     PAST SURGICAL HISTORY: Past Surgical History:  Procedure Laterality Date  . ABDOMINAL HYSTERECTOMY    . AORTIC VALVE REPLACEMENT N/A 08/23/2016   Procedure: AORTIC VALVE REPLACEMENT (AVR) - using partial Upper Sternotomy- 67mm Edwards Intuity Aortic Valve used;  Surgeon: Rexene Alberts, MD;  Location: [facility];  Service: Open Heart Surgery;  Laterality: N/A;  . BLADDER SUSPENSION  2007  . BREAST ENHANCEMENT SURGERY  1980  . CARPAL TUNNEL RELEASE Bilateral   . COLONOSCOPY    . DORSAL COMPARTMENT RELEASE Right 09/15/2014   Procedure: RIGHT WRIST DEQUERVAINS RELEASE ;  Surgeon: Kathryne Hitch, MD;  Location: [facility];  Service: Orthopedics;  Laterality: Right;  . ESOPHAGOGASTRODUODENOSCOPY    . KNEE ARTHROSCOPY  04/15/2012   Procedure: ARTHROSCOPY KNEE;  Surgeon: [name], MD;  Location: [place] [facility];  Service: Orthopedics;  Laterality: Right;  . LAPAROSCOPIC CHOLECYSTECTOMY    . LASIK    . OOPHORECTOMY    . PALATE TO GINGIVA GRAFT  2017  . RIGHT/LEFT HEART CATH AND CORONARY ANGIOGRAPHY N/A 08/02/2016   Procedure: Right/Left Heart Cath and Coronary Angiography;   Surgeon: Sherren Mocha, MD;  Location: [facility];  Service: Cardiovascular;  Laterality: N/A;  . ROTATOR CUFF REPAIR Left   . TEE WITHOUT CARDIOVERSION N/A 08/23/2016   Procedure: TRANSESOPHAGEAL ECHOCARDIOGRAM (TEE);  Surgeon: Rexene Alberts, MD;  Location: [facility];  Service: Open Heart Surgery;  Laterality: N/A;  . TOTAL KNEE ARTHROPLASTY  04/15/2012   Procedure: TOTAL KNEE ARTHROPLASTY;  Surgeon: [name], MD;  Location: [facility];  Service: Orthopedics;  Laterality: Right;  . TRIGGER FINGER RELEASE Right 04/20/2015   Procedure: RIGHT TRIGGER FINGER RELEASE (TENDON SHEALTH INCISION) ;  Surgeon: [name], MD;  Location: [facility];  Service: Orthopedics;  Laterality: Right;    SOCIAL HISTORY: Social History   Tobacco Use  . Smoking status: Never Smoker  . Smokeless tobacco: Never Used  Substance Use Topics  . Alcohol use: No  . Drug use: No    FAMILY HISTORY: Family History  Problem Relation Age of Onset  . COPD Mother   . Colon polyps Mother   . Irritable bowel syndrome Mother   . Anxiety disorder Mother   . Heart disease Father   . Alcohol abuse Father   . Colon polyps Maternal Aunt     ROS: Review of Systems  Constitutional: Positive for weight loss.  Gastrointestinal: Negative for diarrhea, nausea and vomiting.  Musculoskeletal:       Negative muscle weakness  Endo/Heme/Allergies:       Negative hypoglycemia    PHYSICAL EXAM: Blood pressure (!) 146/50, pulse (!) 49, temperature 97.9 F (36.6 C), temperature source Oral, height 5\' 5"  (1.651 m), weight 208 lb (94.3 kg), SpO2 98 %. Body mass index is 34.61 kg/m. Physical Exam  Constitutional: She is oriented to person, place, and time. She appears well-developed and well-nourished.  Cardiovascular: Normal rate.  Pulmonary/Chest: Effort normal.  Musculoskeletal: Normal range of motion.  Neurological: She is oriented to person, place, and time.  Skin: Skin is warm and dry.    Psychiatric: She has a normal mood and affect. Her behavior is normal.  Vitals reviewed.   RECENT LABS AND TESTS: BMET    Component Value Date/Time   NA 129 (L) 03/24/2018 1055   NA 129 (L) 01/19/2018 0959   K 4.8 03/24/2018 1055   CL 96 03/24/2018 1055   CO2 26 03/24/2018 1055   GLUCOSE 111 (H) 03/24/2018 1055   BUN 19 03/24/2018 1055   BUN 16 01/19/2018 0959   CREATININE 0.59 03/24/2018 1055   CALCIUM 9.3 03/24/2018 1055   GFRNONAA 79 01/19/2018 0959   GFRAA 91 01/19/2018 0959   Lab Results  Component Value Date   HGBA1C 6.6 (H) 01/19/2018   HGBA1C 6.2 (A) 11/25/2017   HGBA1C 6.4 05/23/2017   HGBA1C 6.7 (H) 01/02/2017   HGBA1C 6.6 (H) 08/21/2016   Lab Results  Component Value Date   INSULIN 12.1 01/19/2018   CBC    Component Value Date/Time   WBC 9.1 02/17/2018 0934   RBC 4.29 02/17/2018 0934   HGB 11.4 (L) 02/17/2018 0934   HGB 11.7 01/19/2018 0959   HCT 34.8 (L) 02/17/2018 0934   HCT 35.7 01/19/2018 0959   PLT 384.0 02/17/2018 0934   PLT 358 04/24/2017 1231   MCV 81.1 02/17/2018 0934   MCV 81 01/19/2018 0959   MCH 26.5 (L) 01/19/2018 0959   MCH 27.4 08/26/2016 0248   MCHC 32.7 02/17/2018 0934   RDW 14.5 02/17/2018 0934   RDW 14.9 01/19/2018 0959   LYMPHSABS 2.9 01/19/2018 0959   MONOABS 0.6 05/11/2015 0836   EOSABS 0.1 01/19/2018 0959   BASOSABS 0.0 01/19/2018 0959   Iron/TIBC/Ferritin/ %Sat No results found for: IRON, TIBC, FERRITIN, IRONPCTSAT Lipid Panel     Component Value Date/Time   CHOL 123 01/19/2018 0959   TRIG 76 01/19/2018 0959   HDL 54 01/19/2018 0959   CHOLHDL 3 05/23/2017 0840   VLDL 19.6 05/23/2017 0840   LDLCALC 54 01/19/2018 0959   LDLDIRECT 58.0 11/25/2017 1156   Hepatic Function Panel     Component Value Date/Time   PROT 6.7 02/17/2018 0934   PROT 6.6  01/19/2018 0959   ALBUMIN 4.5 02/17/2018 0934   ALBUMIN 4.6 01/19/2018 0959   AST 24 02/17/2018 0934   ALT 13 02/17/2018 0934   ALKPHOS 144 (H) 02/17/2018 0934    BILITOT 0.4 02/17/2018 0934   BILITOT 0.5 01/19/2018 0959   BILIDIR 0.1 05/11/2015 0836      Component Value Date/Time   TSH 5.300 (H) 01/19/2018 0959   TSH 3.390 04/24/2017 1231   TSH 3.340 09/16/2016 1552    ASSESSMENT AND PLAN: Vitamin D deficiency - Plan: Vitamin D, Ergocalciferol, (DRISDOL) 50000 units CAPS capsule  Type 2 diabetes mellitus without complication, without long-term current use of insulin (HCC)  Class 1 obesity with serious comorbidity and body mass index (BMI) of 34.0 to 34.9 in adult, unspecified obesity type  PLAN:  Vitamin D Deficiency [name] was informed that low vitamin D levels contributes to fatigue and are associated with obesity, breast, and colon cancer. Mar[place]ite agrees to continue taking prescription Vit D @50 ,000 IU every week #4 and we will refill for 1 month. She will follow up for routine testing of vitamin D, at least 2-3 times per year. She was informed of the risk of over-replacement of vitamin D and agrees to not increase her dose unless she discusses this with [name] first. [name] agrees to follow up with our clinic in 2 weeks.  Diabetes II [name] has been given extensive diabetes education by myself today including ideal fasting and post-prandial blood glucose readings, individual ideal Hgb A1c goals and hypoglycemia prevention. We discussed the importance of good blood sugar control to decrease the likelihood of diabetic complications such as nephropathy, neuropathy, limb loss, blindness, coronary artery disease, and death. We discussed the importance of intensive lifestyle modification including diet, exercise and weight loss as the first line treatment for diabetes. [name] agrees to continue taking metformin and she agrees to follow up with our clinic in 2 weeks.  Obesity [name] is currently in the action stage of change. As such, her goal is to continue with weight loss efforts She has agreed to follow the Category 3  plan [name] has been instructed to work up to a goal of 150 minutes of combined cardio and strengthening exercise per week for weight loss and overall health benefits. We discussed the following Behavioral Modification Strategies today: work on meal planning and easy cooking plans and ways to avoid boredom eating   [name] has agreed to follow up with our clinic in 2 weeks. She was informed of the importance of frequent follow up visits to maximize her success with intensive lifestyle modifications for her multiple health conditions.   OBESITY BEHAVIORAL INTERVENTION VISIT  Today's visit was # 6   Starting weight: 220 lbs Starting date: 01/19/18 Today's weight : 208 lbs  Today's date: 03/31/2018 Total lbs lost to date: 12 At least 15 minutes were spent on discussing the following behavioral intervention visit.   ASK: We discussed the diagnosis of obesity with [name] today and [place] agreed to give [name] permission to discuss obesity behavioral modification therapy today.  ASSESS: [name] has the diagnosis of obesity and her BMI today is 34.61 [name] is in the action stage of change   ADVISE: [name] was educated on the multiple health risks of obesity as well as the benefit of weight loss to improve her health. She was advised of the need for long term treatment and the importance of lifestyle modifications.  AGREE: Multiple dietary modification options and treatment options were discussed and  [name] agreed to the above obesity treatment plan.  [name], am acting as transcriptionist for [name], PA-C I, [name], PA-C have reviewed above note and agree with its content

## 2018-04-02 LAB — HM DIABETES EYE EXAM

## 2018-04-03 ENCOUNTER — Encounter: Payer: Self-pay | Admitting: Family Medicine

## 2018-04-06 ENCOUNTER — Encounter: Payer: Self-pay | Admitting: Family Medicine

## 2018-04-10 ENCOUNTER — Other Ambulatory Visit: Payer: Self-pay | Admitting: Family Medicine

## 2018-04-13 ENCOUNTER — Ambulatory Visit (INDEPENDENT_AMBULATORY_CARE_PROVIDER_SITE_OTHER): Payer: Medicare Other | Admitting: Physician Assistant

## 2018-04-13 VITALS — BP 150/63 | HR 52 | Temp 98.4°F | Ht 65.0 in | Wt 207.0 lb

## 2018-04-13 DIAGNOSIS — E669 Obesity, unspecified: Secondary | ICD-10-CM

## 2018-04-13 DIAGNOSIS — E119 Type 2 diabetes mellitus without complications: Secondary | ICD-10-CM

## 2018-04-13 DIAGNOSIS — Z6834 Body mass index (BMI) 34.0-34.9, adult: Secondary | ICD-10-CM | POA: Diagnosis not present

## 2018-04-15 NOTE — Progress Notes (Signed)
Office: [phone]  /  Fax: [phone]   HPI:   Chief Complaint: OBESITY [name] is here to discuss her progress with her obesity treatment plan. She is on the Category 3 plan and is following her eating plan approximately 95 % of the time. She states she is exercising 0 minutes 0 times per week. [name] did well with weight loss. She reports following the plan. She is ready to get back in the gym.  Her weight is 207 lb (93.9 kg) today and has had a weight loss of 1 pound over a period of 1 to 2 weeks since her last visit. She has lost 13 lbs since starting treatment with [name].  Diabetes II [name] has a diagnosis of diabetes type II. [name] states fasting BGs range between 95 and 132. She is on metformin and denies nausea, vomiting, or diarrhea. She denies any hypoglycemic episodes. Last A1c was 6.6. She has been working on intensive lifestyle modifications including diet, exercise, and weight loss to help control her blood glucose levels.  ALLERGIES: Allergies  Allergen Reactions  . Augmentin [Amoxicillin-Pot Clavulanate] Rash    rash  . Erythromycin Nausea Only  . Penicillins Itching and Rash    Has patient had a PCN reaction causing immediate rash, facial/tongue/throat swelling, SOB or lightheadedness with hypotension: No Has patient had a PCN reaction causing severe rash involving mucus membranes or skin necrosis: No Has patient had a PCN reaction that required hospitalization: No Has patient had a PCN reaction occurring within the last 10 years: No  If all of the above answers are "NO", then may proceed with Cephalosporin use.     MEDICATIONS: Current Outpatient Medications on File Prior to Visit  Medication Sig Dispense Refill  . atorvastatin (LIPITOR) 40 MG tablet Take 1 tablet (40 mg total) by mouth daily at 6 PM. 90 tablet 3  . Biotin 5000 MCG TABS Take 5,000 mcg by mouth daily.     . calcium-vitamin D (OSCAL WITH D) 500-200 MG-UNIT per tablet Take 2  tablets by mouth daily.     . clindamycin (CLEOCIN) 300 MG capsule TAKE 2 CAPSULES BY MOUTH 1 HOUR PRIOR TOO DENTAL CLEANING. 2 capsule 0  . clopidogrel (PLAVIX) 75 MG tablet Take 1 tablet (75 mg total) by mouth daily. 90 tablet 3  . fluticasone (FLONASE) 50 MCG/ACT nasal spray Place 2 sprays into both nostrils daily.     [name] glucose blood (ONETOUCH VERIO) test strip Use to test blood sugars daily. Dx: E11.9 100 each 0  . Insulin Pen Needle (BD PEN NEEDLE NANO 2ND GEN) 32G X 4 MM MISC 1 Package by Does not apply route 2 (two) times daily. 100 each 0  . Lancets (ONETOUCH ULTRASOFT) lancets Use to test blood sugars daily. Dx: E11.9 100 each 12  . loratadine (CLARITIN) 10 MG tablet Take 10 mg by mouth at bedtime.     . metFORMIN (GLUCOPHAGE) 500 MG tablet TAKE 1 TABLET WITH BREAKFAST AND 1/2 TO 1 TABLET WITH DINNER. 180 tablet 1  . metoCLOPramide (REGLAN) 10 MG tablet TAKE 1/2 TABLET BY MOUTH 4 TIMES A DAY 60 tablet 5  . metoprolol tartrate (LOPRESSOR) 25 MG tablet Take 0.5 tablets (12.5 mg total) by mouth 2 (two) times daily. 60 tablet 11  . Multiple Vitamin (MULTIVITAMIN WITH MINERALS) TABS tablet Take 1 tablet by mouth daily.    [name] omeprazole (PRILOSEC OTC) 20 MG tablet Take 40 mg by mouth daily with breakfast.    . potassium chloride SA (  K-DUR,KLOR-CON) 20 MEQ tablet Take 1 tablet (20 mEq total) by mouth daily. 60 tablet 11  . Probiotic Product (ALIGN) 4 MG CAPS Take 4 mg by mouth daily.     . psyllium (METAMUCIL SMOOTH TEXTURE) 28 % packet Take 1 packet by mouth daily before breakfast.     . venlafaxine XR (EFFEXOR-XR) 75 MG 24 hr capsule TAKE 1 CAPSULE (75 MG TOTAL) BY MOUTH DAILY WITH BREAKFAST. 90 capsule 2  . Vitamin D, Ergocalciferol, (DRISDOL) 50000 units CAPS capsule Take 1 capsule (50,000 Units total) by mouth every 7 (seven) days. 4 capsule 0   No current facility-administered medications on file prior to visit.     PAST MEDICAL HISTORY: Past Medical History:  Diagnosis Date  .  Allergy   . Anxiety   . Arthritis    back & knee  . Asthma     mild per pt shows up with resp illness  . Chronic diastolic congestive heart failure ([place])   . Colon polyps   . Constipation   . Diabetes mellitus without complication ([place])   . Dyspnea   . Gastric polyps   . Gastroparesis   . GERD (gastroesophageal reflux disease)   . Headache    sinus headaches and migraines at times  . Heart murmur   . History of migraine headaches   . HTN (hypertension)   . Hyperlipidemia   . IBS (irritable bowel syndrome)   . Joint pain   . Lumbar disc disease   . S/P aortic valve replacement with bioprosthetic valve 08/23/2016   25 mm Edwards Intuity rapid-deployment bovine pericardial tissue valve via partial upper mini sternotomy  . Sleep apnea   . TIA (transient ischemic attack)     PAST SURGICAL HISTORY: Past Surgical History:  Procedure Laterality Date  . ABDOMINAL HYSTERECTOMY    . AORTIC VALVE REPLACEMENT N/A 08/23/2016   Procedure: AORTIC VALVE REPLACEMENT (AVR) - using partial Upper Sternotomy- 43mm Edwards Intuity Aortic Valve used;  Surgeon: Rexene Alberts, MD;  Location: [facility];  Service: Open Heart Surgery;  Laterality: N/A;  . BLADDER SUSPENSION  2007  . BREAST ENHANCEMENT SURGERY  1980  . CARPAL TUNNEL RELEASE Bilateral   . COLONOSCOPY    . DORSAL COMPARTMENT RELEASE Right 09/15/2014   Procedure: RIGHT WRIST DEQUERVAINS RELEASE ;  Surgeon: Kathryne Hitch, MD;  Location: [facility];  Service: Orthopedics;  Laterality: Right;  . ESOPHAGOGASTRODUODENOSCOPY    . KNEE ARTHROSCOPY  04/15/2012   Procedure: ARTHROSCOPY KNEE;  Surgeon: [name], MD;  Location: [facility];  Service: Orthopedics;  Laterality: Right;  . LAPAROSCOPIC CHOLECYSTECTOMY    . LASIK    . OOPHORECTOMY    . PALATE TO GINGIVA GRAFT  2017  . RIGHT/LEFT HEART CATH AND CORONARY ANGIOGRAPHY N/A 08/02/2016   Procedure: Right/Left Heart Cath and Coronary Angiography;  Surgeon: Sherren Mocha, MD;   Location: [facility];  Service: Cardiovascular;  Laterality: N/A;  . ROTATOR CUFF REPAIR Left   . TEE WITHOUT CARDIOVERSION N/A 08/23/2016   Procedure: TRANSESOPHAGEAL ECHOCARDIOGRAM (TEE);  Surgeon: Rexene Alberts, MD;  Location: [facility];  Service: Open Heart Surgery;  Laterality: N/A;  . TOTAL KNEE ARTHROPLASTY  04/15/2012   Procedure: TOTAL KNEE ARTHROPLASTY;  Surgeon: [name], MD;  Location: [facility];  Service: Orthopedics;  Laterality: Right;  . TRIGGER FINGER RELEASE Right 04/20/2015   Procedure: RIGHT TRIGGER FINGER RELEASE (TENDON SHEALTH INCISION) ;  Surgeon: [name], MD;  Location: [facility];  Service: Orthopedics;  Laterality: Right;    SOCIAL HISTORY: Social History   Tobacco Use  . Smoking status: Never Smoker  . Smokeless tobacco: Never Used  Substance Use Topics  . Alcohol use: No  . Drug use: No    FAMILY HISTORY: Family History  Problem Relation Age of Onset  . COPD Mother   . Colon polyps Mother   . Irritable bowel syndrome Mother   . Anxiety disorder Mother   . Heart disease Father   . Alcohol abuse Father   . Colon polyps Maternal Aunt     ROS: Review of Systems  Constitutional: Positive for weight loss.  Gastrointestinal: Negative for diarrhea, nausea and vomiting.  Endo/Heme/Allergies:       Negative hypoglycemia    PHYSICAL EXAM: Blood pressure (!) 150/63, pulse (!) 52, temperature 98.4 F (36.9 C), temperature source Oral, height 5\' 5"  (1.651 m), weight 207 lb (93.9 kg), SpO2 100 %. Body mass index is 34.45 kg/m. Physical Exam  Constitutional: She is oriented to person, place, and time. She appears well-developed and well-nourished.  Cardiovascular: Normal rate.  Pulmonary/Chest: Effort normal.  Musculoskeletal: Normal range of motion.  Neurological: She is oriented to person, place, and time.  Skin: Skin is warm and dry.  Psychiatric: She has a normal mood and affect. Her behavior is normal.  Vitals  reviewed.   RECENT LABS AND TESTS: BMET    Component Value Date/Time   NA 129 (L) 03/24/2018 1055   NA 129 (L) 01/19/2018 0959   K 4.8 03/24/2018 1055   CL 96 03/24/2018 1055   CO2 26 03/24/2018 1055   GLUCOSE 111 (H) 03/24/2018 1055   BUN 19 03/24/2018 1055   BUN 16 01/19/2018 0959   CREATININE 0.59 03/24/2018 1055   CALCIUM 9.3 03/24/2018 1055   GFRNONAA 79 01/19/2018 0959   GFRAA 91 01/19/2018 0959   Lab Results  Component Value Date   HGBA1C 6.6 (H) 01/19/2018   HGBA1C 6.2 (A) 11/25/2017   HGBA1C 6.4 05/23/2017   HGBA1C 6.7 (H) 01/02/2017   HGBA1C 6.6 (H) 08/21/2016   Lab Results  Component Value Date   INSULIN 12.1 01/19/2018   CBC    Component Value Date/Time   WBC 9.1 02/17/2018 0934   RBC 4.29 02/17/2018 0934   HGB 11.4 (L) 02/17/2018 0934   HGB 11.7 01/19/2018 0959   HCT 34.8 (L) 02/17/2018 0934   HCT 35.7 01/19/2018 0959   PLT 384.0 02/17/2018 0934   PLT 358 04/24/2017 1231   MCV 81.1 02/17/2018 0934   MCV 81 01/19/2018 0959   MCH 26.5 (L) 01/19/2018 0959   MCH 27.4 08/26/2016 0248   MCHC 32.7 02/17/2018 0934   RDW 14.5 02/17/2018 0934   RDW 14.9 01/19/2018 0959   LYMPHSABS 2.9 01/19/2018 0959   MONOABS 0.6 05/11/2015 0836   EOSABS 0.1 01/19/2018 0959   BASOSABS 0.0 01/19/2018 0959   Iron/TIBC/Ferritin/ %Sat No results found for: IRON, TIBC, FERRITIN, IRONPCTSAT Lipid Panel     Component Value Date/Time   CHOL 123 01/19/2018 0959   TRIG 76 01/19/2018 0959   HDL 54 01/19/2018 0959   CHOLHDL 3 05/23/2017 0840   VLDL 19.6 05/23/2017 0840   LDLCALC 54 01/19/2018 0959   LDLDIRECT 58.0 11/25/2017 1156   Hepatic Function Panel     Component Value Date/Time   PROT 6.7 02/17/2018 0934   PROT 6.6 01/19/2018 0959   ALBUMIN 4.5 02/17/2018 0934   ALBUMIN 4.6 01/19/2018 0959  AST 24 02/17/2018 0934   ALT 13 02/17/2018 0934   ALKPHOS 144 (H) 02/17/2018 0934   BILITOT 0.4 02/17/2018 0934   BILITOT 0.5 01/19/2018 0959   BILIDIR 0.1 05/11/2015  0836      Component Value Date/Time   TSH 5.300 (H) 01/19/2018 0959   TSH 3.390 04/24/2017 1231   TSH 3.340 09/16/2016 1552    ASSESSMENT AND PLAN: Type 2 diabetes mellitus without complication, without long-term current use of insulin (HCC)  Class 1 obesity with serious comorbidity and body mass index (BMI) of 34.0 to 34.9 in adult, unspecified obesity type  PLAN:  Diabetes II [name] has been given extensive diabetes education by myself today including ideal fasting and post-prandial blood glucose readings, individual ideal Hgb A1c goals and hypoglycemia prevention. We discussed the importance of good blood sugar control to decrease the likelihood of diabetic complications such as nephropathy, neuropathy, limb loss, blindness, coronary artery disease, and death. We discussed the importance of intensive lifestyle modification including diet, exercise and weight loss as the first line treatment for diabetes. [name] agrees to continue her diabetes medications, diet, and weight loss. [name] agrees to follow up with our clinic in 2 weeks.  Obesity [name] is currently in the action stage of change. As such, her goal is to continue with weight loss efforts She has agreed to follow the Category 3 plan [name] has been instructed to work up to a goal of 150 minutes of combined cardio and strengthening exercise per week for weight loss and overall health benefits. We discussed the following Behavioral Modification Strategies today: increasing lean protein intake and no skipping meals   [name] has agreed to follow up with our clinic in 2 weeks. She was informed of the importance of frequent follow up visits to maximize her success with intensive lifestyle modifications for her multiple health conditions.   OBESITY BEHAVIORAL INTERVENTION VISIT  Today's visit was # 7   Starting weight: 220 lbs Starting date: 01/19/18 Today's weight : 207 lbs Today's date:  04/13/2018 Total lbs lost to date: 13 At least 15 minutes were spent on discussing the following behavioral intervention visit.   ASK: We discussed the diagnosis of obesity with [name] P [place] today and [name] agreed to give [name] permission to discuss obesity behavioral modification therapy today.  ASSESS: [name] has the diagnosis of obesity and her BMI today is 34.45 [name] is in the action stage of change   ADVISE: [name] was educated on the multiple health risks of obesity as well as the benefit of weight loss to improve her health. She was advised of the need for long term treatment and the importance of lifestyle modifications.  AGREE: Multiple dietary modification options and treatment options were discussed and  [name] agreed to the above obesity treatment plan.  [name] Durie, am acting as transcriptionist for [name], PA-C I, [name], PA-C have reviewed above note and agree with its content

## 2018-04-28 ENCOUNTER — Ambulatory Visit (INDEPENDENT_AMBULATORY_CARE_PROVIDER_SITE_OTHER): Payer: Medicare Other | Admitting: Physician Assistant

## 2018-04-28 VITALS — BP 129/47 | HR 49 | Temp 98.5°F | Ht 65.0 in | Wt 207.0 lb

## 2018-04-28 DIAGNOSIS — E559 Vitamin D deficiency, unspecified: Secondary | ICD-10-CM

## 2018-04-28 DIAGNOSIS — E119 Type 2 diabetes mellitus without complications: Secondary | ICD-10-CM | POA: Diagnosis not present

## 2018-04-28 DIAGNOSIS — E669 Obesity, unspecified: Secondary | ICD-10-CM

## 2018-04-28 DIAGNOSIS — Z6834 Body mass index (BMI) 34.0-34.9, adult: Secondary | ICD-10-CM

## 2018-04-28 MED ORDER — VITAMIN D (ERGOCALCIFEROL) 1.25 MG (50000 UNIT) PO CAPS: 50000.0000 [IU] | ORAL_CAPSULE | ORAL | 0 refills | Status: DC

## 2018-04-28 NOTE — Progress Notes (Signed)
Office: [phone]  /  Fax: [phone]   HPI:   Chief Complaint: OBESITY [name] is here to discuss her progress with her obesity treatment plan. She is on the Category 3 plan and is following her eating plan approximately 90 % of the time. She states she is exercising 0 minutes 0 times per week. [name] reports that she had a stomach virus for a couple of days and was unable to eat the food on the plan. She is feeling better and ready to get back on track.  Her weight is 207 lb (93.9 kg) today and has not lost weight since her last visit. She has lost 13 lbs since starting treatment with [name].  Vitamin D deficiency [name] has a diagnosis of vitamin D deficiency. She is currently taking prescription vit D and denies nausea, vomiting, or diarrhea.  Diabetes II [name] has a diagnosis of diabetes type II. [name] states that her fasting BG's range between 79 and 129. Her last A1c was 6.6 on 12/2917. She notes a slight increase in her blood sugar after eating 100 calorie popcorn. She is on metformin and denies nausea, vomiting, or diarrhea. She denies any hypoglycemic episodes or polyphagia. She has been working on intensive lifestyle modifications including diet, exercise, and weight loss to help control her blood glucose levels.  ALLERGIES: Allergies  Allergen Reactions  . Augmentin [Amoxicillin-Pot Clavulanate] Rash    rash  . Erythromycin Nausea Only  . Penicillins Itching and Rash    Has patient had a PCN reaction causing immediate rash, facial/tongue/throat swelling, SOB or lightheadedness with hypotension: No Has patient had a PCN reaction causing severe rash involving mucus membranes or skin necrosis: No Has patient had a PCN reaction that required hospitalization: No Has patient had a PCN reaction occurring within the last 10 years: No  If all of the above answers are "NO", then may proceed with Cephalosporin use.     MEDICATIONS: Current Outpatient  Medications on File Prior to Visit  Medication Sig Dispense Refill  . atorvastatin (LIPITOR) 40 MG tablet Take 1 tablet (40 mg total) by mouth daily at 6 PM. 90 tablet 3  . Biotin 5000 MCG TABS Take 5,000 mcg by mouth daily.     . calcium-vitamin D (OSCAL WITH D) 500-200 MG-UNIT per tablet Take 2 tablets by mouth daily.     . clindamycin (CLEOCIN) 300 MG capsule TAKE 2 CAPSULES BY MOUTH 1 HOUR PRIOR TOO DENTAL CLEANING. 2 capsule 0  . clopidogrel (PLAVIX) 75 MG tablet Take 1 tablet (75 mg total) by mouth daily. 90 tablet 3  . fluticasone (FLONASE) 50 MCG/ACT nasal spray Place 2 sprays into both nostrils daily.     [name] glucose blood (ONETOUCH VERIO) test strip Use to test blood sugars daily. Dx: E11.9 100 each 0  . Insulin Pen Needle (BD PEN NEEDLE NANO 2ND GEN) 32G X 4 MM MISC 1 Package by Does not apply route 2 (two) times daily. 100 each 0  . Lancets (ONETOUCH ULTRASOFT) lancets Use to test blood sugars daily. Dx: E11.9 100 each 12  . loratadine (CLARITIN) 10 MG tablet Take 10 mg by mouth at bedtime.     . metFORMIN (GLUCOPHAGE) 500 MG tablet TAKE 1 TABLET WITH BREAKFAST AND 1/2 TO 1 TABLET WITH DINNER. 180 tablet 1  . metoCLOPramide (REGLAN) 10 MG tablet TAKE 1/2 TABLET BY MOUTH 4 TIMES A DAY 60 tablet 5  . metoprolol tartrate (LOPRESSOR) 25 MG tablet Take 0.5 tablets (12.5 mg total) by  mouth 2 (two) times daily. 60 tablet 11  . Multiple Vitamin (MULTIVITAMIN WITH MINERALS) TABS tablet Take 1 tablet by mouth daily.    [name] omeprazole (PRILOSEC OTC) 20 MG tablet Take 40 mg by mouth daily with breakfast.    . potassium chloride SA (K-DUR,KLOR-CON) 20 MEQ tablet Take 1 tablet (20 mEq total) by mouth daily. 60 tablet 11  . Probiotic Product (ALIGN) 4 MG CAPS Take 4 mg by mouth daily.     . psyllium (METAMUCIL SMOOTH TEXTURE) 28 % packet Take 1 packet by mouth daily before breakfast.     . venlafaxine XR (EFFEXOR-XR) 75 MG 24 hr capsule TAKE 1 CAPSULE (75 MG TOTAL) BY MOUTH DAILY WITH BREAKFAST. 90  capsule 2   No current facility-administered medications on file prior to visit.     PAST MEDICAL HISTORY: Past Medical History:  Diagnosis Date  . Allergy   . Anxiety   . Arthritis    back & knee  . Asthma     mild per pt shows up with resp illness  . Chronic diastolic congestive heart failure ([place])   . Colon polyps   . Constipation   . Diabetes mellitus without complication ([place])   . Dyspnea   . Gastric polyps   . Gastroparesis   . GERD (gastroesophageal reflux disease)   . Headache    sinus headaches and migraines at times  . Heart murmur   . History of migraine headaches   . HTN (hypertension)   . Hyperlipidemia   . IBS (irritable bowel syndrome)   . Joint pain   . Lumbar disc disease   . S/P aortic valve replacement with bioprosthetic valve 08/23/2016   25 mm Edwards Intuity rapid-deployment bovine pericardial tissue valve via partial upper mini sternotomy  . Sleep apnea   . TIA (transient ischemic attack)     PAST SURGICAL HISTORY: Past Surgical History:  Procedure Laterality Date  . ABDOMINAL HYSTERECTOMY    . AORTIC VALVE REPLACEMENT N/A 08/23/2016   Procedure: AORTIC VALVE REPLACEMENT (AVR) - using partial Upper Sternotomy- 32mm Edwards Intuity Aortic Valve used;  Surgeon: Rexene Alberts, MD;  Location: [facility];  Service: Open Heart Surgery;  Laterality: N/A;  . BLADDER SUSPENSION  2007  . BREAST ENHANCEMENT SURGERY  1980  . CARPAL TUNNEL RELEASE Bilateral   . COLONOSCOPY    . DORSAL COMPARTMENT RELEASE Right 09/15/2014   Procedure: RIGHT WRIST DEQUERVAINS RELEASE ;  Surgeon: Kathryne Hitch, MD;  Location: [facility];  Service: Orthopedics;  Laterality: Right;  . ESOPHAGOGASTRODUODENOSCOPY    . KNEE ARTHROSCOPY  04/15/2012   Procedure: ARTHROSCOPY KNEE;  Surgeon: [name], MD;  Location: [facility];  Service: Orthopedics;  Laterality: Right;  . LAPAROSCOPIC CHOLECYSTECTOMY    . LASIK    . OOPHORECTOMY    . PALATE TO GINGIVA GRAFT  2017  .  RIGHT/LEFT HEART CATH AND CORONARY ANGIOGRAPHY N/A 08/02/2016   Procedure: Right/Left Heart Cath and Coronary Angiography;  Surgeon: Sherren Mocha, MD;  Location: [facility];  Service: Cardiovascular;  Laterality: N/A;  . ROTATOR CUFF REPAIR Left   . TEE WITHOUT CARDIOVERSION N/A 08/23/2016   Procedure: TRANSESOPHAGEAL ECHOCARDIOGRAM (TEE);  Surgeon: Rexene Alberts, MD;  Location: [facility];  Service: Open Heart Surgery;  Laterality: N/A;  . TOTAL KNEE ARTHROPLASTY  04/15/2012   Procedure: TOTAL KNEE ARTHROPLASTY;  Surgeon: [name], MD;  Location: [facility];  Service: Orthopedics;  Laterality: Right;  . TRIGGER FINGER  RELEASE Right 04/20/2015   Procedure: RIGHT TRIGGER FINGER RELEASE (TENDON SHEALTH INCISION) ;  Surgeon: [name], MD;  Location: [facility];  Service: Orthopedics;  Laterality: Right;    SOCIAL HISTORY: Social History   Tobacco Use  . Smoking status: Never Smoker  . Smokeless tobacco: Never Used  Substance Use Topics  . Alcohol use: No  . Drug use: No    FAMILY HISTORY: Family History  Problem Relation Age of Onset  . COPD Mother   . Colon polyps Mother   . Irritable bowel syndrome Mother   . Anxiety disorder Mother   . Heart disease Father   . Alcohol abuse Father   . Colon polyps Maternal Aunt     ROS: Review of Systems  Constitutional: Negative for weight loss.  Gastrointestinal: Negative for diarrhea, nausea and vomiting.  Endo/Heme/Allergies:       Negative for hypoglycemia. Negative for polyphagia.    PHYSICAL EXAM: Blood pressure (!) 129/47, pulse (!) 49, temperature 98.5 F (36.9 C), temperature source Oral, height 5\' 5"  (1.651 m), weight 207 lb (93.9 kg), SpO2 100 %. Body mass index is 34.45 kg/m. Physical Exam  Constitutional: She is oriented to person, place, and time. She appears well-developed and well-nourished.  Cardiovascular: Normal rate.  Pulmonary/Chest: Effort normal.  Musculoskeletal: Normal range of  motion.  Neurological: She is oriented to person, place, and time.  Skin: Skin is warm and dry.  Psychiatric: She has a normal mood and affect. Her behavior is normal.  Vitals reviewed.   RECENT LABS AND TESTS: BMET    Component Value Date/Time   NA 129 (L) 03/24/2018 1055   NA 129 (L) 01/19/2018 0959   K 4.8 03/24/2018 1055   CL 96 03/24/2018 1055   CO2 26 03/24/2018 1055   GLUCOSE 111 (H) 03/24/2018 1055   BUN 19 03/24/2018 1055   BUN 16 01/19/2018 0959   CREATININE 0.59 03/24/2018 1055   CALCIUM 9.3 03/24/2018 1055   GFRNONAA 79 01/19/2018 0959   GFRAA 91 01/19/2018 0959   Lab Results  Component Value Date   HGBA1C 6.6 (H) 01/19/2018   HGBA1C 6.2 (A) 11/25/2017   HGBA1C 6.4 05/23/2017   HGBA1C 6.7 (H) 01/02/2017   HGBA1C 6.6 (H) 08/21/2016   Lab Results  Component Value Date   INSULIN 12.1 01/19/2018   CBC    Component Value Date/Time   WBC 9.1 02/17/2018 0934   RBC 4.29 02/17/2018 0934   HGB 11.4 (L) 02/17/2018 0934   HGB 11.7 01/19/2018 0959   HCT 34.8 (L) 02/17/2018 0934   HCT 35.7 01/19/2018 0959   PLT 384.0 02/17/2018 0934   PLT 358 04/24/2017 1231   MCV 81.1 02/17/2018 0934   MCV 81 01/19/2018 0959   MCH 26.5 (L) 01/19/2018 0959   MCH 27.4 08/26/2016 0248   MCHC 32.7 02/17/2018 0934   RDW 14.5 02/17/2018 0934   RDW 14.9 01/19/2018 0959   LYMPHSABS 2.9 01/19/2018 0959   MONOABS 0.6 05/11/2015 0836   EOSABS 0.1 01/19/2018 0959   BASOSABS 0.0 01/19/2018 0959   Iron/TIBC/Ferritin/ %Sat No results found for: IRON, TIBC, FERRITIN, IRONPCTSAT Lipid Panel     Component Value Date/Time   CHOL 123 01/19/2018 0959   TRIG 76 01/19/2018 0959   HDL 54 01/19/2018 0959   CHOLHDL 3 05/23/2017 0840   VLDL 19.6 05/23/2017 0840   LDLCALC 54 01/19/2018 0959   LDLDIRECT 58.0 11/25/2017 1156   Hepatic Function Panel  Component Value Date/Time   PROT 6.7 02/17/2018 0934   PROT 6.6 01/19/2018 0959   ALBUMIN 4.5 02/17/2018 0934   ALBUMIN 4.6 01/19/2018  0959   AST 24 02/17/2018 0934   ALT 13 02/17/2018 0934   ALKPHOS 144 (H) 02/17/2018 0934   BILITOT 0.4 02/17/2018 0934   BILITOT 0.5 01/19/2018 0959   BILIDIR 0.1 05/11/2015 0836      Component Value Date/Time   TSH 5.300 (H) 01/19/2018 0959   TSH 3.390 04/24/2017 1231   TSH 3.340 09/16/2016 1552   Results for AMBRIA, MAYFIELD (MRN [id number]) as of 04/28/2018 15:00  Ref. Range 01/19/2018 09:59  Vitamin D, 25-Hydroxy Latest Ref Range: 30.0 - 100.0 ng/mL 27.9 (L)   ASSESSMENT AND PLAN: Vitamin D deficiency - Plan: Vitamin D, Ergocalciferol, (DRISDOL) 50000 units CAPS capsule  Type 2 diabetes mellitus without complication, without long-term current use of insulin (HCC)  Class 1 obesity with serious comorbidity and body mass index (BMI) of 34.0 to 34.9 in adult, unspecified obesity type  PLAN:  Vitamin D Deficiency [name] was informed that low vitamin D levels contributes to fatigue and are associated with obesity, breast, and colon cancer. She agrees to continue to take prescription Vit D @50 ,000 IU every week #4 with no refills and will follow up for routine testing of vitamin D, at least 2-3 times per year. She was informed of the risk of over-replacement of vitamin D and agrees to not increase her dose unless she discusses this with [name] first. [name] agrees to follow up in 2 weeks.  Diabetes II [name] has been given extensive diabetes education by myself today including ideal fasting and post-prandial blood glucose readings, individual ideal Hgb A1c goals, and hypoglycemia prevention. We discussed the importance of good blood sugar control to decrease the likelihood of diabetic complications such as nephropathy, neuropathy, limb loss, blindness, coronary artery disease, and death. We discussed the importance of intensive lifestyle modification including diet, exercise and weight loss as the first line treatment for diabetes. [name] agrees to continue her metformin,  diet, and weight loss. She agrees to follow up at the agreed upon time in 2 weeks.  Obesity [name] is currently in the action stage of change. As such, her goal is to continue with weight loss efforts. She has agreed to follow the Category 3 plan. [name] has been instructed to work up to a goal of 150 minutes of combined cardio and strengthening exercise per week for weight loss and overall health benefits. We discussed the following Behavioral Modification Strategies today: work on meal planning and easy cooking plans and keeping healthy foods in the home.  [name] has agreed to follow up with our clinic in 2 weeks. She was informed of the importance of frequent follow up visits to maximize her success with intensive lifestyle modifications for her multiple health conditions.   OBESITY BEHAVIORAL INTERVENTION VISIT  Today's visit was # 8   Starting weight: 220 lbs Starting date: 01/19/18 Today's weight : Weight: 207 lb (93.9 kg)  Today's date: 04/28/2018 Total lbs lost to date: 13 At least 15 minutes were spent on discussing the following behavioral intervention visit.  ASK: We discussed the diagnosis of obesity with [name] today and [name] agreed to give [name] permission to discuss obesity behavioral modification therapy today.  ASSESS: [name] has the diagnosis of obesity and her BMI today is 34.45. [name] is in the action stage of change.   ADVISE: [name] was educated on the multiple health  risks of obesity as well as the benefit of weight loss to improve her health. She was advised of the need for long term treatment and the importance of lifestyle modifications to improve her current health and to decrease her risk of future health problems.  AGREE: Multiple dietary modification options and treatment options were discussed and [name] agreed to follow the recommendations documented in the above note.  ARRANGE: [name] was educated on  the importance of frequent visits to treat obesity as outlined per CMS and USPSTF guidelines and agreed to schedule her next follow up appointment today.  Lenward Chancellor, am acting as transcriptionist for [name], PA-C I, [name], PA-C have reviewed above note and agree with its content

## 2018-05-12 ENCOUNTER — Ambulatory Visit (INDEPENDENT_AMBULATORY_CARE_PROVIDER_SITE_OTHER): Payer: Medicare Other | Admitting: Physician Assistant

## 2018-05-12 ENCOUNTER — Encounter (INDEPENDENT_AMBULATORY_CARE_PROVIDER_SITE_OTHER): Payer: Self-pay | Admitting: Physician Assistant

## 2018-05-12 VITALS — BP 130/68 | HR 50 | Temp 98.4°F | Ht 65.0 in | Wt 205.0 lb

## 2018-05-12 DIAGNOSIS — E119 Type 2 diabetes mellitus without complications: Secondary | ICD-10-CM | POA: Diagnosis not present

## 2018-05-12 DIAGNOSIS — E669 Obesity, unspecified: Secondary | ICD-10-CM | POA: Diagnosis not present

## 2018-05-12 DIAGNOSIS — E559 Vitamin D deficiency, unspecified: Secondary | ICD-10-CM

## 2018-05-12 DIAGNOSIS — Z6834 Body mass index (BMI) 34.0-34.9, adult: Secondary | ICD-10-CM

## 2018-05-18 NOTE — Progress Notes (Signed)
Office: [phone]  /  Fax: [phone]   HPI:   Chief Complaint: OBESITY [name] is here to discuss her progress with her obesity treatment plan. She is on the Category 3 plan and is following her eating plan approximately 95 % of the time. She states she is walking 60 minutes 1 time per week. [name] did well with weight loss. She reports that she is home for Thanksgiving and has her eating strategies in place. She is asking about how to make dinner more interesting.  Her weight is 205 lb (93 kg) today and has had a weight loss of 2 pounds over a period of 2 weeks since her last visit. She has lost 15 lbs since starting treatment with [name].  Diabetes II [name] has a diagnosis of diabetes type II. [name] states that her fasting BG's range between 108 and 132. She is on metformin 500mg  and denies hypoglycemia or polyphagia. Last A1c was 6.6 on 01/19/18. She has been working on intensive lifestyle modifications including diet, exercise, and weight loss to help control her blood glucose levels.  Vitamin D deficiency [name] has a diagnosis of vitamin D deficiency. She is currently taking vit D and denies nausea, vomiting, or muscle weakness.  ALLERGIES: Allergies  Allergen Reactions  . Augmentin [Amoxicillin-Pot Clavulanate] Rash    rash  . Erythromycin Nausea Only  . Penicillins Itching and Rash    Has patient had a PCN reaction causing immediate rash, facial/tongue/throat swelling, SOB or lightheadedness with hypotension: No Has patient had a PCN reaction causing severe rash involving mucus membranes or skin necrosis: No Has patient had a PCN reaction that required hospitalization: No Has patient had a PCN reaction occurring within the last 10 years: No  If all of the above answers are "NO", then may proceed with Cephalosporin use.     MEDICATIONS: Current Outpatient Medications on File Prior to Visit  Medication Sig Dispense Refill  . atorvastatin (LIPITOR) 40  MG tablet Take 1 tablet (40 mg total) by mouth daily at 6 PM. 90 tablet 3  . Biotin 5000 MCG TABS Take 5,000 mcg by mouth daily.     . calcium-vitamin D (OSCAL WITH D) 500-200 MG-UNIT per tablet Take 2 tablets by mouth daily.     . clindamycin (CLEOCIN) 300 MG capsule TAKE 2 CAPSULES BY MOUTH 1 HOUR PRIOR TOO DENTAL CLEANING. 2 capsule 0  . clopidogrel (PLAVIX) 75 MG tablet Take 1 tablet (75 mg total) by mouth daily. 90 tablet 3  . fluticasone (FLONASE) 50 MCG/ACT nasal spray Place 2 sprays into both nostrils daily.     [name] glucose blood (ONETOUCH VERIO) test strip Use to test blood sugars daily. Dx: E11.9 100 each 0  . Insulin Pen Needle (BD PEN NEEDLE NANO 2ND GEN) 32G X 4 MM MISC 1 Package by Does not apply route 2 (two) times daily. 100 each 0  . Lancets (ONETOUCH ULTRASOFT) lancets Use to test blood sugars daily. Dx: E11.9 100 each 12  . loratadine (CLARITIN) 10 MG tablet Take 10 mg by mouth at bedtime.     . metFORMIN (GLUCOPHAGE) 500 MG tablet TAKE 1 TABLET WITH BREAKFAST AND 1/2 TO 1 TABLET WITH DINNER. 180 tablet 1  . metoCLOPramide (REGLAN) 10 MG tablet TAKE 1/2 TABLET BY MOUTH 4 TIMES A DAY 60 tablet 5  . metoprolol tartrate (LOPRESSOR) 25 MG tablet Take 0.5 tablets (12.5 mg total) by mouth 2 (two) times daily. 60 tablet 11  . Multiple Vitamin (MULTIVITAMIN WITH MINERALS)  TABS tablet Take 1 tablet by mouth daily.    [name] omeprazole (PRILOSEC OTC) 20 MG tablet Take 40 mg by mouth daily with breakfast.    . potassium chloride SA (K-DUR,KLOR-CON) 20 MEQ tablet Take 1 tablet (20 mEq total) by mouth daily. 60 tablet 11  . Probiotic Product (ALIGN) 4 MG CAPS Take 4 mg by mouth daily.     . psyllium (METAMUCIL SMOOTH TEXTURE) 28 % packet Take 1 packet by mouth daily before breakfast.     . venlafaxine XR (EFFEXOR-XR) 75 MG 24 hr capsule TAKE 1 CAPSULE (75 MG TOTAL) BY MOUTH DAILY WITH BREAKFAST. 90 capsule 2  . Vitamin D, Ergocalciferol, (DRISDOL) 50000 units CAPS capsule Take 1 capsule (50,000  Units total) by mouth every 7 (seven) days. 4 capsule 0   No current facility-administered medications on file prior to visit.     PAST MEDICAL HISTORY: Past Medical History:  Diagnosis Date  . Allergy   . Anxiety   . Arthritis    back & knee  . Asthma     mild per pt shows up with resp illness  . Chronic diastolic congestive heart failure ([place])   . Colon polyps   . Constipation   . Diabetes mellitus without complication ([place])   . Dyspnea   . Gastric polyps   . Gastroparesis   . GERD (gastroesophageal reflux disease)   . Headache    sinus headaches and migraines at times  . Heart murmur   . History of migraine headaches   . HTN (hypertension)   . Hyperlipidemia   . IBS (irritable bowel syndrome)   . Joint pain   . Lumbar disc disease   . S/P aortic valve replacement with bioprosthetic valve 08/23/2016   25 mm Edwards Intuity rapid-deployment bovine pericardial tissue valve via partial upper mini sternotomy  . Sleep apnea   . TIA (transient ischemic attack)     PAST SURGICAL HISTORY: Past Surgical History:  Procedure Laterality Date  . ABDOMINAL HYSTERECTOMY    . AORTIC VALVE REPLACEMENT N/A 08/23/2016   Procedure: AORTIC VALVE REPLACEMENT (AVR) - using partial Upper Sternotomy- 16mm Edwards Intuity Aortic Valve used;  Surgeon: Rexene Alberts, MD;  Location: [facility];  Service: Open Heart Surgery;  Laterality: N/A;  . BLADDER SUSPENSION  2007  . BREAST ENHANCEMENT SURGERY  1980  . CARPAL TUNNEL RELEASE Bilateral   . COLONOSCOPY    . DORSAL COMPARTMENT RELEASE Right 09/15/2014   Procedure: RIGHT WRIST DEQUERVAINS RELEASE ;  Surgeon: Kathryne Hitch, MD;  Location: [facility];  Service: Orthopedics;  Laterality: Right;  . ESOPHAGOGASTRODUODENOSCOPY    . KNEE ARTHROSCOPY  04/15/2012   Procedure: ARTHROSCOPY KNEE;  Surgeon: [name], MD;  Location: [facility];  Service: Orthopedics;  Laterality: Right;  . LAPAROSCOPIC CHOLECYSTECTOMY    . LASIK    .  OOPHORECTOMY    . PALATE TO GINGIVA GRAFT  2017  . RIGHT/LEFT HEART CATH AND CORONARY ANGIOGRAPHY N/A 08/02/2016   Procedure: Right/Left Heart Cath and Coronary Angiography;  Surgeon: Sherren Mocha, MD;  Location: [facility];  Service: Cardiovascular;  Laterality: N/A;  . ROTATOR CUFF REPAIR Left   . TEE WITHOUT CARDIOVERSION N/A 08/23/2016   Procedure: TRANSESOPHAGEAL ECHOCARDIOGRAM (TEE);  Surgeon: Rexene Alberts, MD;  Location: [facility];  Service: Open Heart Surgery;  Laterality: N/A;  . TOTAL KNEE ARTHROPLASTY  04/15/2012   Procedure: TOTAL KNEE ARTHROPLASTY;  Surgeon: [name], MD;  Location: [facility];  Service: Orthopedics;  Laterality: Right;  . TRIGGER FINGER RELEASE Right 04/20/2015   Procedure: RIGHT TRIGGER FINGER RELEASE (TENDON SHEALTH INCISION) ;  Surgeon: [name], MD;  Location: [place];  Service: Orthopedics;  Laterality: Right;    SOCIAL HISTORY: Social History   Tobacco Use  . Smoking status: Never Smoker  . Smokeless tobacco: Never Used  Substance Use Topics  . Alcohol use: No  . Drug use: No    FAMILY HISTORY: Family History  Problem Relation Age of Onset  . COPD Mother   . Colon polyps Mother   . Irritable bowel syndrome Mother   . Anxiety disorder Mother   . Heart disease Father   . Alcohol abuse Father   . Colon polyps Maternal Aunt     ROS: Review of Systems  Constitutional: Positive for weight loss.  Gastrointestinal: Negative for nausea and vomiting.  Musculoskeletal:       Negative for muscle weakness.  Endo/Heme/Allergies:       Negative for hypoglycemia. Negative for polyphagia.    PHYSICAL EXAM: Blood pressure 130/68, pulse (!) 50, temperature 98.4 F (36.9 C), temperature source Oral, height 5\' 5"  (1.651 m), weight 205 lb (93 kg), SpO2 100 %. Body mass index is 34.11 kg/m. Physical Exam  Constitutional: She is oriented to person, place, and time. She appears well-developed and well-nourished.    Cardiovascular: Normal rate.  Pulmonary/Chest: Effort normal.  Musculoskeletal: Normal range of motion.  Neurological: She is oriented to person, place, and time.  Skin: Skin is warm and dry.  Psychiatric: She has a normal mood and affect. Her behavior is normal.  Vitals reviewed.   RECENT LABS AND TESTS: BMET    Component Value Date/Time   NA 129 (L) 03/24/2018 1055   NA 129 (L) 01/19/2018 0959   K 4.8 03/24/2018 1055   CL 96 03/24/2018 1055   CO2 26 03/24/2018 1055   GLUCOSE 111 (H) 03/24/2018 1055   BUN 19 03/24/2018 1055   BUN 16 01/19/2018 0959   CREATININE 0.59 03/24/2018 1055   CALCIUM 9.3 03/24/2018 1055   GFRNONAA 79 01/19/2018 0959   GFRAA 91 01/19/2018 0959   Lab Results  Component Value Date   HGBA1C 6.6 (H) 01/19/2018   HGBA1C 6.2 (A) 11/25/2017   HGBA1C 6.4 05/23/2017   HGBA1C 6.7 (H) 01/02/2017   HGBA1C 6.6 (H) 08/21/2016   Lab Results  Component Value Date   INSULIN 12.1 01/19/2018   CBC    Component Value Date/Time   WBC 9.1 02/17/2018 0934   RBC 4.29 02/17/2018 0934   HGB 11.4 (L) 02/17/2018 0934   HGB 11.7 01/19/2018 0959   HCT 34.8 (L) 02/17/2018 0934   HCT 35.7 01/19/2018 0959   PLT 384.0 02/17/2018 0934   PLT 358 04/24/2017 1231   MCV 81.1 02/17/2018 0934   MCV 81 01/19/2018 0959   MCH 26.5 (L) 01/19/2018 0959   MCH 27.4 08/26/2016 0248   MCHC 32.7 02/17/2018 0934   RDW 14.5 02/17/2018 0934   RDW 14.9 01/19/2018 0959   LYMPHSABS 2.9 01/19/2018 0959   MONOABS 0.6 05/11/2015 0836   EOSABS 0.1 01/19/2018 0959   BASOSABS 0.0 01/19/2018 0959   Iron/TIBC/Ferritin/ %Sat No results found for: IRON, TIBC, FERRITIN, IRONPCTSAT Lipid Panel     Component Value Date/Time   CHOL 123 01/19/2018 0959   TRIG 76 01/19/2018 0959   HDL 54 01/19/2018 0959   CHOLHDL 3 05/23/2017 0840   VLDL 19.6 05/23/2017 0840  LDLCALC 54 01/19/2018 0959   LDLDIRECT 58.0 11/25/2017 1156   Hepatic Function Panel     Component Value Date/Time   PROT 6.7  02/17/2018 0934   PROT 6.6 01/19/2018 0959   ALBUMIN 4.5 02/17/2018 0934   ALBUMIN 4.6 01/19/2018 0959   AST 24 02/17/2018 0934   ALT 13 02/17/2018 0934   ALKPHOS 144 (H) 02/17/2018 0934   BILITOT 0.4 02/17/2018 0934   BILITOT 0.5 01/19/2018 0959   BILIDIR 0.1 05/11/2015 0836      Component Value Date/Time   TSH 5.300 (H) 01/19/2018 0959   TSH 3.390 04/24/2017 1231   TSH 3.340 09/16/2016 1552   Results for [name], [name] (MRN [id number]) as of 05/18/2018 12:45  Ref. Range 01/19/2018 09:59  Vitamin D, 25-Hydroxy Latest Ref Range: 30.0 - 100.0 ng/mL 27.9 (L)   ASSESSMENT AND PLAN: Type 2 diabetes mellitus without complication, without long-term current use of insulin (HCC)  Vitamin D deficiency  Class 1 obesity with serious comorbidity and body mass index (BMI) of 34.0 to 34.9 in adult, unspecified obesity type  PLAN:  Diabetes II Dezirae has been given extensive diabetes education by myself today including ideal fasting and post-prandial blood glucose readings, individual ideal Hgb A1c goals, and hypoglycemia prevention. We discussed the importance of good blood sugar control to decrease the likelihood of diabetic complications such as nephropathy, neuropathy, limb loss, blindness, coronary artery disease, and death. We discussed the importance of intensive lifestyle modification including diet, exercise and weight loss as the first line treatment for diabetes. [name] agrees to continue her diabetes medications, diet, weight loss, and she will follow up at the agreed upon time in 2 weeks.  Vitamin D Deficiency [name] was informed that low vitamin D levels contributes to fatigue and are associated with obesity, breast, and colon cancer. She agrees to continue to take prescription Vit D @50 ,000 IU every week and will follow up for routine testing of vitamin D, at least 2-3 times per year. She was informed of the risk of over-replacement of vitamin D and agrees to not  increase her dose unless she discusses this with [name] first. [place] agrees to follow up as directed.  Obesity [name] is currently in the action stage of change. As such, her goal is to continue with weight loss efforts. She has agreed to follow the Category 3 plan. [name] has been instructed to work up to a goal of 150 minutes of combined cardio and strengthening exercise per week for weight loss and overall health benefits. We discussed the following Behavioral Modification Strategies today: work on meal planning and easy cooking plans and ways to avoid boredom eating.  [name] has agreed to follow up with our clinic in 2 weeks. She was informed of the importance of frequent follow up visits to maximize her success with intensive lifestyle modifications for her multiple health conditions.   OBESITY BEHAVIORAL INTERVENTION VISIT  Today's visit was # 9   Starting weight: 220 lbs Starting date: 01/19/18 Today's weight : Weight: 205 lb (93 kg)  Today's date: 05/12/2018 Total lbs lost to date: 15 At least 15 minutes were spent on discussing the following behavioral intervention visit.  ASK: We discussed the diagnosis of obesity with [name] today and [name] agreed to give [name] permission to discuss obesity behavioral modification therapy today.  ASSESS: [name] has the diagnosis of obesity and her BMI today is 34.11. [name] is in the action stage of change.   ADVISE: [name] was educated on  the multiple health risks of obesity as well as the benefit of weight loss to improve her health. She was advised of the need for long term treatment and the importance of lifestyle modifications to improve her current health and to decrease her risk of future health problems.  AGREE: Multiple dietary modification options and treatment options were discussed and [name] agreed to follow the recommendations documented in the above note.  ARRANGE: [name]  was educated on the importance of frequent visits to treat obesity as outlined per CMS and USPSTF guidelines and agreed to schedule her next follow up appointment today.  Lenward Chancellor, am acting as transcriptionist for [name], PA-C I, [name], PA-C have reviewed above note and agree with its content

## 2018-05-20 ENCOUNTER — Other Ambulatory Visit: Payer: Self-pay | Admitting: Family Medicine

## 2018-05-22 ENCOUNTER — Encounter (INDEPENDENT_AMBULATORY_CARE_PROVIDER_SITE_OTHER): Payer: Self-pay | Admitting: Physician Assistant

## 2018-05-25 ENCOUNTER — Other Ambulatory Visit (INDEPENDENT_AMBULATORY_CARE_PROVIDER_SITE_OTHER): Payer: Self-pay

## 2018-05-25 DIAGNOSIS — E559 Vitamin D deficiency, unspecified: Secondary | ICD-10-CM

## 2018-05-25 MED ORDER — VITAMIN D (ERGOCALCIFEROL) 1.25 MG (50000 UNIT) PO CAPS: 50000.0000 [IU] | ORAL_CAPSULE | ORAL | 0 refills | Status: DC

## 2018-05-26 ENCOUNTER — Ambulatory Visit (INDEPENDENT_AMBULATORY_CARE_PROVIDER_SITE_OTHER)
Admission: RE | Admit: 2018-05-26 | Discharge: 2018-05-26 | Disposition: A | Discharge status: Home / Self Care | Payer: Medicare Other | Source: Ambulatory Visit | Attending: Family Medicine | Admitting: Family Medicine

## 2018-05-26 ENCOUNTER — Other Ambulatory Visit: Payer: Self-pay | Admitting: Cardiology

## 2018-05-26 ENCOUNTER — Encounter: Payer: Self-pay | Admitting: Family Medicine

## 2018-05-26 DIAGNOSIS — Z953 Presence of xenogenic heart valve: Secondary | ICD-10-CM

## 2018-05-26 DIAGNOSIS — M8580 Other specified disorders of bone density and structure, unspecified site: Secondary | ICD-10-CM

## 2018-05-26 DIAGNOSIS — M85852 Other specified disorders of bone density and structure, left thigh: Secondary | ICD-10-CM

## 2018-05-27 ENCOUNTER — Other Ambulatory Visit: Payer: Self-pay | Admitting: Family Medicine

## 2018-06-02 ENCOUNTER — Encounter (INDEPENDENT_AMBULATORY_CARE_PROVIDER_SITE_OTHER): Payer: Self-pay | Admitting: Physician Assistant

## 2018-06-02 ENCOUNTER — Ambulatory Visit (INDEPENDENT_AMBULATORY_CARE_PROVIDER_SITE_OTHER): Payer: Medicare Other | Admitting: Physician Assistant

## 2018-06-02 VITALS — BP 155/62 | HR 44 | Temp 98.7°F | Ht 65.0 in | Wt 204.0 lb

## 2018-06-02 DIAGNOSIS — E119 Type 2 diabetes mellitus without complications: Secondary | ICD-10-CM | POA: Diagnosis not present

## 2018-06-02 DIAGNOSIS — E669 Obesity, unspecified: Secondary | ICD-10-CM

## 2018-06-02 DIAGNOSIS — Z6834 Body mass index (BMI) 34.0-34.9, adult: Secondary | ICD-10-CM | POA: Diagnosis not present

## 2018-06-02 NOTE — Progress Notes (Signed)
Office: [phone]  /  Fax: [phone]   HPI:   Chief Complaint: OBESITY [name] is here to discuss her progress with her obesity treatment plan. She is on the  follow the Category 3 plan and is following her eating plan approximately 95 % of the time. She states she is exercising 0 minutes 0 times per week. [name] did well with weight loss. She reports portion controlling over the holiday. She is feeling hungry at night and eating yogurt to help with that.  Her weight is 204 lb (92.5 kg) today and has had a weight loss of 1 pound over a period of 3 weeks since her last visit. She has lost 16 lbs since starting treatment with [name].  Diabetes II [name] has a diagnosis of diabetes type II. [name] states fasting BGs range between 114 and 131 and denies any hypoglycemic episodes or polyphagia. Last A1c was 6.6.  She has been working on intensive lifestyle modifications including diet, exercise, and weight loss to help control her blood glucose levels.  ALLERGIES: Allergies  Allergen Reactions  . Augmentin [Amoxicillin-Pot Clavulanate] Rash    rash  . Erythromycin Nausea Only  . Penicillins Itching and Rash    Has patient had a PCN reaction causing immediate rash, facial/tongue/throat swelling, SOB or lightheadedness with hypotension: No Has patient had a PCN reaction causing severe rash involving mucus membranes or skin necrosis: No Has patient had a PCN reaction that required hospitalization: No Has patient had a PCN reaction occurring within the last 10 years: No  If all of the above answers are "NO", then may proceed with Cephalosporin use.     MEDICATIONS: Current Outpatient Medications on File Prior to Visit  Medication Sig Dispense Refill  . atorvastatin (LIPITOR) 40 MG tablet Take 1 tablet (40 mg total) by mouth daily at 6 PM. 90 tablet 3  . Biotin 5000 MCG TABS Take 5,000 mcg by mouth daily.     . calcium-vitamin D (OSCAL WITH D) 500-200 MG-UNIT per tablet  Take 2 tablets by mouth daily.     . clindamycin (CLEOCIN) 300 MG capsule TAKE 2 CAPSULES BY MOUTH 1 HOUR PRIOR TOO DENTAL CLEANING. 2 capsule 0  . clopidogrel (PLAVIX) 75 MG tablet Take 1 tablet (75 mg total) by mouth daily. 90 tablet 3  . fluticasone (FLONASE) 50 MCG/ACT nasal spray Place 2 sprays into both nostrils daily.     [name] glucose blood (ONETOUCH VERIO) test strip Use to test blood sugars daily. Dx: E11.9 100 each 0  . Insulin Pen Needle (BD PEN NEEDLE NANO 2ND GEN) 32G X 4 MM MISC 1 Package by Does not apply route 2 (two) times daily. 100 each 0  . Lancets (ONETOUCH ULTRASOFT) lancets Use to test blood sugars daily. Dx: E11.9 100 each 12  . loratadine (CLARITIN) 10 MG tablet Take 10 mg by mouth at bedtime.     . metFORMIN (GLUCOPHAGE) 500 MG tablet TAKE 1 TABLET WITH BREAKFAST AND 1/2 TO 1 TABLET WITH DINNER. 180 tablet 1  . metoCLOPramide (REGLAN) 10 MG tablet TAKE 1/2 TABLET BY MOUTH 4 TIMES A DAY 60 tablet 5  . metoprolol tartrate (LOPRESSOR) 25 MG tablet TAKE 1/2 TAB BY MOUTH TWICE A DAY 30 tablet 5  . Multiple Vitamin (MULTIVITAMIN WITH MINERALS) TABS tablet Take 1 tablet by mouth daily.    [name] omeprazole (PRILOSEC OTC) 20 MG tablet Take 40 mg by mouth daily with breakfast.    . potassium chloride SA (K-DUR,KLOR-CON) 20 MEQ tablet  Take 1 tablet (20 mEq total) by mouth daily. 60 tablet 11  . Probiotic Product (ALIGN) 4 MG CAPS Take 4 mg by mouth daily.     . psyllium (METAMUCIL SMOOTH TEXTURE) 28 % packet Take 1 packet by mouth daily before breakfast.     . venlafaxine XR (EFFEXOR-XR) 75 MG 24 hr capsule TAKE 1 CAPSULE (75 MG TOTAL) BY MOUTH DAILY WITH BREAKFAST. 90 capsule 2  . Vitamin D, Ergocalciferol, (DRISDOL) 1.25 MG (50000 UT) CAPS capsule Take 1 capsule (50,000 Units total) by mouth every 7 (seven) days. 4 capsule 0   No current facility-administered medications on file prior to visit.     PAST MEDICAL HISTORY: Past Medical History:  Diagnosis Date  . Allergy   . Anxiety    . Arthritis    back & knee  . Asthma     mild per pt shows up with resp illness  . Chronic diastolic congestive heart failure ([place])   . Colon polyps   . Constipation   . Diabetes mellitus without complication ([place])   . Dyspnea   . Gastric polyps   . Gastroparesis   . GERD (gastroesophageal reflux disease)   . Headache    sinus headaches and migraines at times  . Heart murmur   . History of migraine headaches   . HTN (hypertension)   . Hyperlipidemia   . IBS (irritable bowel syndrome)   . Joint pain   . Lumbar disc disease   . S/P aortic valve replacement with bioprosthetic valve 08/23/2016   25 mm Edwards Intuity rapid-deployment bovine pericardial tissue valve via partial upper mini sternotomy  . Sleep apnea   . TIA (transient ischemic attack)     PAST SURGICAL HISTORY: Past Surgical History:  Procedure Laterality Date  . ABDOMINAL HYSTERECTOMY    . AORTIC VALVE REPLACEMENT N/A 08/23/2016   Procedure: AORTIC VALVE REPLACEMENT (AVR) - using partial Upper Sternotomy- 54mm Edwards Intuity Aortic Valve used;  Surgeon: Rexene Alberts, MD;  Location: [facility];  Service: Open Heart Surgery;  Laterality: N/A;  . BLADDER SUSPENSION  2007  . BREAST ENHANCEMENT SURGERY  1980  . CARPAL TUNNEL RELEASE Bilateral   . COLONOSCOPY    . DORSAL COMPARTMENT RELEASE Right 09/15/2014   Procedure: RIGHT WRIST DEQUERVAINS RELEASE ;  Surgeon: Kathryne Hitch, MD;  Location: [facility];  Service: Orthopedics;  Laterality: Right;  . ESOPHAGOGASTRODUODENOSCOPY    . KNEE ARTHROSCOPY  04/15/2012   Procedure: ARTHROSCOPY KNEE;  Surgeon: [name], MD;  Location: [facility];  Service: Orthopedics;  Laterality: Right;  . LAPAROSCOPIC CHOLECYSTECTOMY    . LASIK    . OOPHORECTOMY    . PALATE TO GINGIVA GRAFT  2017  . RIGHT/LEFT HEART CATH AND CORONARY ANGIOGRAPHY N/A 08/02/2016   Procedure: Right/Left Heart Cath and Coronary Angiography;  Surgeon: Sherren Mocha, MD;  Location: [facility];  Service: Cardiovascular;  Laterality: N/A;  . ROTATOR CUFF REPAIR Left   . TEE WITHOUT CARDIOVERSION N/A 08/23/2016   Procedure: TRANSESOPHAGEAL ECHOCARDIOGRAM (TEE);  Surgeon: Rexene Alberts, MD;  Location: [facility];  Service: Open Heart Surgery;  Laterality: N/A;  . TOTAL KNEE ARTHROPLASTY  04/15/2012   Procedure: TOTAL KNEE ARTHROPLASTY;  Surgeon: [name], MD;  Location: [facility];  Service: Orthopedics;  Laterality: Right;  . TRIGGER FINGER RELEASE Right 04/20/2015   Procedure: RIGHT TRIGGER FINGER RELEASE (TENDON SHEALTH INCISION) ;  Surgeon: [name], MD;  Location: [facility];  Service: Orthopedics;  Laterality: Right;    SOCIAL HISTORY: Social History   Tobacco Use  . Smoking status: Never Smoker  . Smokeless tobacco: Never Used  Substance Use Topics  . Alcohol use: No  . Drug use: No    FAMILY HISTORY: Family History  Problem Relation Age of Onset  . COPD Mother   . Colon polyps Mother   . Irritable bowel syndrome Mother   . Anxiety disorder Mother   . Heart disease Father   . Alcohol abuse Father   . Colon polyps Maternal Aunt     ROS: Review of Systems  Constitutional: Positive for weight loss.  Endo/Heme/Allergies:       Negative for hypoglycemia Negative for polyphagia    PHYSICAL EXAM: Blood pressure (!) 155/62, pulse (!) 44, temperature 98.7 F (37.1 C), temperature source Oral, height 5\' 5"  (1.651 m), weight 204 lb (92.5 kg), SpO2 99 %. Body mass index is 33.95 kg/m. Physical Exam  RECENT LABS AND TESTS: BMET    Component Value Date/Time   NA 129 (L) 03/24/2018 1055   NA 129 (L) 01/19/2018 0959   K 4.8 03/24/2018 1055   CL 96 03/24/2018 1055   CO2 26 03/24/2018 1055   GLUCOSE 111 (H) 03/24/2018 1055   BUN 19 03/24/2018 1055   BUN 16 01/19/2018 0959   CREATININE 0.59 03/24/2018 1055   CALCIUM 9.3 03/24/2018 1055   GFRNONAA 79 01/19/2018 0959   GFRAA 91 01/19/2018 0959   Lab Results  Component Value Date    HGBA1C 6.6 (H) 01/19/2018   HGBA1C 6.2 (A) 11/25/2017   HGBA1C 6.4 05/23/2017   HGBA1C 6.7 (H) 01/02/2017   HGBA1C 6.6 (H) 08/21/2016   Lab Results  Component Value Date   INSULIN 12.1 01/19/2018   CBC    Component Value Date/Time   WBC 9.1 02/17/2018 0934   RBC 4.29 02/17/2018 0934   HGB 11.4 (L) 02/17/2018 0934   HGB 11.7 01/19/2018 0959   HCT 34.8 (L) 02/17/2018 0934   HCT 35.7 01/19/2018 0959   PLT 384.0 02/17/2018 0934   PLT 358 04/24/2017 1231   MCV 81.1 02/17/2018 0934   MCV 81 01/19/2018 0959   MCH 26.5 (L) 01/19/2018 0959   MCH 27.4 08/26/2016 0248   MCHC 32.7 02/17/2018 0934   RDW 14.5 02/17/2018 0934   RDW 14.9 01/19/2018 0959   LYMPHSABS 2.9 01/19/2018 0959   MONOABS 0.6 05/11/2015 0836   EOSABS 0.1 01/19/2018 0959   BASOSABS 0.0 01/19/2018 0959   Iron/TIBC/Ferritin/ %Sat No results found for: IRON, TIBC, FERRITIN, IRONPCTSAT Lipid Panel     Component Value Date/Time   CHOL 123 01/19/2018 0959   TRIG 76 01/19/2018 0959   HDL 54 01/19/2018 0959   CHOLHDL 3 05/23/2017 0840   VLDL 19.6 05/23/2017 0840   LDLCALC 54 01/19/2018 0959   LDLDIRECT 58.0 11/25/2017 1156   Hepatic Function Panel     Component Value Date/Time   PROT 6.7 02/17/2018 0934   PROT 6.6 01/19/2018 0959   ALBUMIN 4.5 02/17/2018 0934   ALBUMIN 4.6 01/19/2018 0959   AST 24 02/17/2018 0934   ALT 13 02/17/2018 0934   ALKPHOS 144 (H) 02/17/2018 0934   BILITOT 0.4 02/17/2018 0934   BILITOT 0.5 01/19/2018 0959   BILIDIR 0.1 05/11/2015 0836      Component Value Date/Time   TSH 5.300 (H) 01/19/2018 0959   TSH 3.390 04/24/2017 1231   TSH 3.340 09/16/2016 1552    ASSESSMENT AND  PLAN: Type 2 diabetes mellitus without complication, without long-term current use of insulin (HCC)  Class 1 obesity with serious comorbidity and body mass index (BMI) of 34.0 to 34.9 in adult, unspecified obesity type  PLAN: Diabetes II Gracia has been given extensive diabetes education by myself  today including ideal fasting and post-prandial blood glucose readings, individual ideal HgA1c goals  and hypoglycemia prevention. We discussed the importance of good blood sugar control to decrease the likelihood of diabetic complications such as nephropathy, neuropathy, limb loss, blindness, coronary artery disease, and death. We discussed the importance of intensive lifestyle modification including diet, exercise and weight loss as the first line treatment for diabetes. Ajna agrees to continue her diabetes medications and will follow up at the agreed upon time.  Obesity Evony is currently in the action stage of change. As such, her goal is to continue with weight loss efforts She has agreed to follow the Category 3 plan Kalanie has been instructed to work up to a goal of 150 minutes of combined cardio and strengthening exercise per week for weight loss and overall health benefits. We discussed the following Behavioral Modification Strategies today: work on meal planning and easy cooking plans and holiday eating strategies   Vesna has agreed to follow up with our clinic in 3 weeks. She was informed of the importance of frequent follow up visits to maximize her success with intensive lifestyle modifications for her multiple health conditions.   OBESITY BEHAVIORAL INTERVENTION VISIT  Today's visit was # 10   Starting weight: 220 lb Starting date: 01/19/18 Today's weight : Weight: 204 lb (92.5 kg)  Today's date: 06/02/2018 Total lbs lost to date: 16 lb At least 15 minutes were spent on discussing the following behavioral intervention visit.   ASK: We discussed the diagnosis of obesity with Reegan P Dunlop today and Glanda agreed to give [name] permission to discuss obesity behavioral modification therapy today.  ASSESS: Nashia has the diagnosis of obesity and her BMI today is 33.95 Charmon is in the action stage of change   ADVISE: Pricella was educated  on the multiple health risks of obesity as well as the benefit of weight loss to improve her health. She was advised of the need for long term treatment and the importance of lifestyle modifications to improve her current health and to decrease her risk of future health problems.  AGREE: Multiple dietary modification options and treatment options were discussed and  Brissa agreed to follow the recommendations documented in the above note.  ARRANGE: Channel was educated on the importance of frequent visits to treat obesity as outlined per CMS and USPSTF guidelines and agreed to schedule her next follow up appointment today.  Leary Roca, am acting as transcriptionist for [name], PA-C I, [name], PA-C have reviewed above note and agree with its content

## 2018-06-04 ENCOUNTER — Ambulatory Visit (INDEPENDENT_AMBULATORY_CARE_PROVIDER_SITE_OTHER): Payer: Medicare Other | Admitting: Family Medicine

## 2018-06-04 ENCOUNTER — Encounter: Payer: Self-pay | Admitting: Family Medicine

## 2018-06-04 VITALS — BP 122/66 | HR 50 | Temp 98.7°F | Ht 65.0 in | Wt 207.2 lb

## 2018-06-04 DIAGNOSIS — I10 Essential (primary) hypertension: Secondary | ICD-10-CM | POA: Diagnosis not present

## 2018-06-04 DIAGNOSIS — E119 Type 2 diabetes mellitus without complications: Secondary | ICD-10-CM | POA: Diagnosis not present

## 2018-06-04 DIAGNOSIS — I5189 Other ill-defined heart diseases: Secondary | ICD-10-CM

## 2018-06-04 DIAGNOSIS — Z8673 Personal history of transient ischemic attack (TIA), and cerebral infarction without residual deficits: Secondary | ICD-10-CM

## 2018-06-04 DIAGNOSIS — Z0000 Encounter for general adult medical examination without abnormal findings: Secondary | ICD-10-CM

## 2018-06-04 DIAGNOSIS — F411 Generalized anxiety disorder: Secondary | ICD-10-CM

## 2018-06-04 DIAGNOSIS — M85852 Other specified disorders of bone density and structure, left thigh: Secondary | ICD-10-CM

## 2018-06-04 DIAGNOSIS — E559 Vitamin D deficiency, unspecified: Secondary | ICD-10-CM | POA: Diagnosis not present

## 2018-06-04 DIAGNOSIS — R7989 Other specified abnormal findings of blood chemistry: Secondary | ICD-10-CM

## 2018-06-04 DIAGNOSIS — E871 Hypo-osmolality and hyponatremia: Secondary | ICD-10-CM

## 2018-06-04 DIAGNOSIS — E782 Mixed hyperlipidemia: Secondary | ICD-10-CM

## 2018-06-04 DIAGNOSIS — M85851 Other specified disorders of bone density and structure, right thigh: Secondary | ICD-10-CM

## 2018-06-04 LAB — CBC
HCT: 36.3 % (ref 36.0–46.0)
Hemoglobin: 11.8 g/dL — ABNORMAL LOW (ref 12.0–15.0)
MCHC: 32.5 g/dL (ref 30.0–36.0)
MCV: 80.5 fl (ref 78.0–100.0)
Platelets: 346 10*3/uL (ref 150.0–400.0)
RBC: 4.51 Mil/uL (ref 3.87–5.11)
RDW: 15.7 % — ABNORMAL HIGH (ref 11.5–15.5)
WBC: 8.8 10*3/uL (ref 4.0–10.5)

## 2018-06-04 LAB — COMPREHENSIVE METABOLIC PANEL
ALT: 15 U/L (ref 0–35)
AST: 22 U/L (ref 0–37)
Albumin: 4.4 g/dL (ref 3.5–5.2)
Alkaline Phosphatase: 151 U/L — ABNORMAL HIGH (ref 39–117)
BUN: 18 mg/dL (ref 6–23)
CO2: 27 mEq/L (ref 19–32)
Calcium: 9.1 mg/dL (ref 8.4–10.5)
Chloride: 97 mEq/L (ref 96–112)
Creatinine, Ser: 0.61 mg/dL (ref 0.40–1.20)
GFR: 103.82 mL/min (ref >60.00)
Glucose, Bld: 109 mg/dL — ABNORMAL HIGH (ref 70–99)
Potassium: 4.6 mEq/L (ref 3.5–5.1)
Sodium: 131 mEq/L — ABNORMAL LOW (ref 135–145)
Total Bilirubin: 0.4 mg/dL (ref 0.2–1.2)
Total Protein: 6.5 g/dL (ref 6.0–8.3)

## 2018-06-04 LAB — LIPID PANEL
Cholesterol: 116 mg/dL (ref 0–200)
HDL: 47.4 mg/dL (ref >39.00)
LDL Cholesterol: 54 mg/dL (ref 0–99)
NonHDL: 68.19
Total CHOL/HDL Ratio: 2
Triglycerides: 70 mg/dL (ref 0.0–149.0)
VLDL: 14 mg/dL (ref 0.0–40.0)

## 2018-06-04 LAB — TSH: TSH: 3.38 u[IU]/mL (ref 0.35–4.50)

## 2018-06-04 LAB — HEMOGLOBIN A1C: Hgb A1c MFr Bld: 6.1 % (ref 4.6–6.5)

## 2018-06-04 LAB — VITAMIN D 25 HYDROXY (VIT D DEFICIENCY, FRACTURES): VITD: 42.08 ng/mL (ref 30.00–100.00)

## 2018-06-04 NOTE — Progress Notes (Signed)
Phone: [phone]  Subjective:  Patient presents today for their annual physical. Chief complaint-noted.   See problem oriented charting- ROS- full  review of systems was completed and negative except for: hearing loss, tinnitus  The following were reviewed and entered/updated in epic: Past Medical History:  Diagnosis Date  . Allergy   . Anxiety   . Arthritis    back & knee  . Asthma     mild per pt shows up with resp illness  . Chronic diastolic congestive heart failure ([place])   . Colon polyps   . Constipation   . Diabetes mellitus without complication ([place])   . Dyspnea   . Gastric polyps   . Gastroparesis   . GERD (gastroesophageal reflux disease)   . Headache    sinus headaches and migraines at times  . Heart murmur   . History of migraine headaches   . HTN (hypertension)   . Hyperlipidemia   . IBS (irritable bowel syndrome)   . Joint pain   . Lumbar disc disease   . S/P aortic valve replacement with bioprosthetic valve 08/23/2016   25 mm Edwards Intuity rapid-deployment bovine pericardial tissue valve via partial upper mini sternotomy  . Sleep apnea   . TIA (transient ischemic attack)    Patient Active Problem List   Diagnosis Date Noted  . History of CVA in adulthood 08/13/2017    Priority: High  . S/P minimally invasive aortic valve replacement with bioprosthetic valve 08/23/2016    Priority: High  . Diastolic dysfunction     Priority: High  . Diabetes mellitus type 2, controlled ([place]) 05/12/2014    Priority: High  . Hypertension 01/02/2017    Priority: Medium  . Osteopenia 05/27/2016    Priority: Medium  . OSA (obstructive sleep apnea) 02/27/2016    Priority: Medium  . Elevated alkaline phosphatase level 05/16/2015    Priority: Medium  . GERD (gastroesophageal reflux disease) 03/10/2014    Priority: Medium  . Generalized anxiety disorder 03/10/2014    Priority: Medium  . Migraines 03/10/2014    Priority: Medium  . Irritable bowel syndrome  12/15/2008    Priority: Medium  . OBESITY, MORBID 10/12/2008    Priority: Medium  . Hyperlipidemia 02/09/2008    Priority: Medium  . Bruxism 10/23/2015    Priority: Low  . Atypical chest pain 11/10/2014    Priority: Low  . Acne 05/12/2014    Priority: Low  . EUSTACHIAN TUBE DYSFUNCTION, CHRONIC 06/14/2010    Priority: Low  . Low back pain 08/11/2008    Priority: Low  . Allergic rhinitis 02/12/2007    Priority: Low  . ASTHMA 02/12/2007    Priority: Low  . Vitamin D deficiency 06/04/2018  . Hyponatremia 02/17/2018  . Blurry vision 08/13/2017   Past Surgical History:  Procedure Laterality Date  . ABDOMINAL HYSTERECTOMY    . AORTIC VALVE REPLACEMENT N/A 08/23/2016   Procedure: AORTIC VALVE REPLACEMENT (AVR) - using partial Upper Sternotomy- 78mm Edwards Intuity Aortic Valve used;  Surgeon: Rexene Alberts, MD;  Location: [facility];  Service: Open Heart Surgery;  Laterality: N/A;  . BLADDER SUSPENSION  2007  . BREAST ENHANCEMENT SURGERY  1980  . CARPAL TUNNEL RELEASE Bilateral   . COLONOSCOPY    . DORSAL COMPARTMENT RELEASE Right 09/15/2014   Procedure: RIGHT WRIST DEQUERVAINS RELEASE ;  Surgeon: Kathryne Hitch, MD;  Location: [facility];  Service: Orthopedics;  Laterality: Right;  . ESOPHAGOGASTRODUODENOSCOPY    . KNEE ARTHROSCOPY  04/15/2012  Procedure: ARTHROSCOPY KNEE;  Surgeon: [name], MD;  Location: [facility];  Service: Orthopedics;  Laterality: Right;  . LAPAROSCOPIC CHOLECYSTECTOMY    . LASIK    . OOPHORECTOMY    . PALATE TO GINGIVA GRAFT  2017  . RIGHT/LEFT HEART CATH AND CORONARY ANGIOGRAPHY N/A 08/02/2016   Procedure: Right/Left Heart Cath and Coronary Angiography;  Surgeon: Sherren Mocha, MD;  Location: [facility];  Service: Cardiovascular;  Laterality: N/A;  . ROTATOR CUFF REPAIR Left   . TEE WITHOUT CARDIOVERSION N/A 08/23/2016   Procedure: TRANSESOPHAGEAL ECHOCARDIOGRAM (TEE);  Surgeon: Rexene Alberts, MD;  Location: [facility];  Service: Open  Heart Surgery;  Laterality: N/A;  . TOTAL KNEE ARTHROPLASTY  04/15/2012   Procedure: TOTAL KNEE ARTHROPLASTY;  Surgeon: [name], MD;  Location: [facility];  Service: Orthopedics;  Laterality: Right;  . TRIGGER FINGER RELEASE Right 04/20/2015   Procedure: RIGHT TRIGGER FINGER RELEASE (TENDON SHEALTH INCISION) ;  Surgeon: [name], MD;  Location: [facility];  Service: Orthopedics;  Laterality: Right;    Family History  Problem Relation Age of Onset  . COPD Mother   . Colon polyps Mother   . Irritable bowel syndrome Mother   . Anxiety disorder Mother   . Heart disease Father   . Alcohol abuse Father   . Colon polyps Maternal Aunt     Medications- reviewed and updated Current Outpatient Medications  Medication Sig Dispense Refill  . atorvastatin (LIPITOR) 40 MG tablet Take 1 tablet (40 mg total) by mouth daily at 6 PM. 90 tablet 3  . Biotin 5000 MCG TABS Take 5,000 mcg by mouth daily.     . calcium-vitamin D (OSCAL WITH D) 500-200 MG-UNIT per tablet Take 2 tablets by mouth daily.     . clindamycin (CLEOCIN) 300 MG capsule TAKE 2 CAPSULES BY MOUTH 1 HOUR PRIOR TOO DENTAL CLEANING. 2 capsule 0  . clopidogrel (PLAVIX) 75 MG tablet Take 1 tablet (75 mg total) by mouth daily. 90 tablet 3  . fluticasone (FLONASE) 50 MCG/ACT nasal spray Place 2 sprays into both nostrils daily.     [name] glucose blood (ONETOUCH VERIO) test strip Use to test blood sugars daily. Dx: E11.9 100 each 0  . Insulin Pen Needle (BD PEN NEEDLE NANO 2ND GEN) 32G X 4 MM MISC 1 Package by Does not apply route 2 (two) times daily. 100 each 0  . Lancets (ONETOUCH ULTRASOFT) lancets Use to test blood sugars daily. Dx: E11.9 100 each 12  . loratadine (CLARITIN) 10 MG tablet Take 10 mg by mouth at bedtime.     . metFORMIN (GLUCOPHAGE) 500 MG tablet TAKE 1 TABLET WITH BREAKFAST AND 1/2 TO 1 TABLET WITH DINNER. 180 tablet 1  . metoCLOPramide (REGLAN) 10 MG tablet TAKE 1/2 TABLET BY MOUTH 4 TIMES A DAY 60 tablet  5  . metoprolol tartrate (LOPRESSOR) 25 MG tablet TAKE 1/2 TAB BY MOUTH TWICE A DAY 30 tablet 5  . Multiple Vitamin (MULTIVITAMIN WITH MINERALS) TABS tablet Take 1 tablet by mouth daily.    [name] omeprazole (PRILOSEC OTC) 20 MG tablet Take 40 mg by mouth daily with breakfast.    . potassium chloride SA (K-DUR,KLOR-CON) 20 MEQ tablet Take 1 tablet (20 mEq total) by mouth daily. 60 tablet 11  . Probiotic Product (ALIGN) 4 MG CAPS Take 4 mg by mouth daily.     . psyllium (METAMUCIL SMOOTH TEXTURE) 28 % packet Take 1 packet by mouth daily before  breakfast.     . venlafaxine XR (EFFEXOR-XR) 75 MG 24 hr capsule TAKE 1 CAPSULE (75 MG TOTAL) BY MOUTH DAILY WITH BREAKFAST. 90 capsule 2  . Vitamin D, Ergocalciferol, (DRISDOL) 1.25 MG (50000 UT) CAPS capsule Take 1 capsule (50,000 Units total) by mouth every 7 (seven) days. 4 capsule 0   No current facility-administered medications for this visit.     Allergies-reviewed and updated Allergies  Allergen Reactions  . Augmentin [Amoxicillin-Pot Clavulanate] Rash    rash  . Erythromycin Nausea Only  . Penicillins Itching and Rash    Has patient had a PCN reaction causing immediate rash, facial/tongue/throat swelling, SOB or lightheadedness with hypotension: No Has patient had a PCN reaction causing severe rash involving mucus membranes or skin necrosis: No Has patient had a PCN reaction that required hospitalization: No Has patient had a PCN reaction occurring within the last 10 years: No  If all of the above answers are "NO", then may proceed with Cephalosporin use.     Social History   Social History Narrative   She lives with husband (1978) and two dogs. Step-son [name] (1971-psychiatrist in [place]). No grandkids. 2 dogs-sheltie/collie mix and border collie mix      Highest level of education:  Master in education   She is retired Animal nutritionist x 32 years.      Hobbies: time with dogs             Objective: BP 122/66 (BP Location:  Left Arm, Patient Position: Sitting, Cuff Size: Large)   Pulse (!) 50   Temp 98.7 F (37.1 C) (Oral)   Ht 5\' 5"  (1.651 m)   Wt 207 lb 3.2 oz (94 kg)   LMP  (LMP Unknown)   SpO2 95%   BMI 34.48 kg/m  Gen: NAD, resting comfortably HEENT: Mucous membranes are moist. Oropharynx normal Neck: no thyromegaly CV: RRR no murmurs rubs or gallops Lungs: CTAB no crackles, wheeze, rhonchi Abdomen: soft/nontender/nondistended/normal bowel sounds. No rebound or guarding.  Ext: no edema Skin: warm, dry Neuro: grossly normal, moves all extremities, PERRLA  Diabetic Foot Exam - Simple   Simple Foot Form Diabetic Foot exam was performed with the following findings:  Yes 06/04/2018  9:33 AM  Visual Inspection No deformities, no ulcerations, no other skin breakdown bilaterally:  Yes Sensation Testing Intact to touch and monofilament testing bilaterally:  Yes Pulse Check Posterior Tibialis and Dorsalis pulse intact bilaterally:  Yes Comments     Assessment/Plan:  67 y.o. female presenting for annual physical.  Health Maintenance counseling: 1. Anticipatory guidance: Patient counseled regarding regular dental exams -q6 months, eye exams - yearly,  avoiding smoking and second hand smoke , limiting alcohol to 1 beverage per day- doesn't drink at all .   2. Risk factor reduction:  Advised patient of need for regular exercise and diet rich and fruits and vegetables to reduce risk of heart attack and stroke. Exercise- this is one area she knows she could improve on- not going at all right now. Diet-down 14 pounds from last visit here in August and 20 pounds from last physical.  No longer qualifies for morbid obesity with BMI under 35!  Wt Readings from Last 3 Encounters:  06/04/18 207 lb 3.2 oz (94 kg)  06/02/18 204 lb (92.5 kg)  05/12/18 205 lb (93 kg)  3. Immunizations/screenings/ancillary studies- already had flu shot.  Discussed Shingrix- just cant find it right now.   Immunization History    Administered Date(s) Administered  .  Influenza Split 04/04/2011, 03/05/2012  . Influenza Whole 04/16/2007, 03/16/2009, 03/13/2010  . Influenza,inj,Quad PF,6+ Mos 03/30/2013, 03/10/2014, 03/23/2015, 02/27/2016  . Influenza-Unspecified 02/27/2017, 02/28/2017, 01/31/2018  . Pneumococcal Conjugate-13 05/21/2016  . Pneumococcal Polysaccharide-23 05/23/2017  . Td 06/24/1996, 08/06/2007, 11/07/2017  . Tdap 11/07/2017  . Zoster 05/12/2014  4. Cervical cancer screening- she has passed age based screening recommendations.  She does still follow with gynecology every 2 years 5. Breast cancer screening-  breast exam with gynecology and mammogram 01/19/2018 6. Colon cancer screening - 07/28/2014 with 10-year follow-up 7. Skin cancer screening-follows with Dr. Mechele Dawley dermatology.advised regular sunscreen use. Denies worrisome, changing, or new skin lesions.  8. Birth control/STD check- monogomous and postmenopausal 9. Osteoporosis screening at 64- recent bone density actually showed improvement in bone density-very encouraging.  Continue calcium and vitamin D and target weightbearing exercise. Currently on 50k units once a week.  10.  Never smoker  Status of chronic or acute concerns   History of stroke-February 2019- on Plavix since this time.  Status post aortic valve replacement with bioprosthetic valve- needs dental prophylaxis.  Has follow up with Dr. Meda Coffee next Friday.   Hyponatremia- has been hypotonic-thought SIADH from venlafaxine-we reduced her dose.  Her anxiety has remained reasonably controlled on lower dose of venlafaxine. I am not confident she would do well with stopping medication so will continue as long as sodium at least close to 130.   Migraines-uses sumatriptan once a week on average In past- has had really good stretch with only 1 in 6 months.   Mild asthma-not recently having to use albuterol - declines refill for now- as long as controls sinus/allergy related  issues-on Claritin, Astelin, Nasonex and saline. Gets allergy shots.   Low back pain/degenerative disc disease-better with weight loss- not taking anything for this- knows bleeding risks of nsaids with plavix  GERD has been told by Dr. [name] to take 40 mg of PPI so she wants to continue this dose of omeprazole  IBS- reglan somewhat helpful but has to use sugar free ginger ale at times  Diabetes mellitus type 2, controlled ([place]) S: Well controlled last visit on metformin 500 mg twice a day  Lab Results  Component Value Date   HGBA1C 6.6 (H) 01/19/2018   HGBA1C 6.2 (A) 11/25/2017   HGBA1C 6.4 05/23/2017   A/P: Hopefully stable-update A1c -Foot exam completed today  - weigh to wellness wants insulin random- will order but told patient not 100% sure what utility this has for her- they can inform her though  Hypertension Hypertension-controlled on  metoprolol 12.5 mg twice daily.  No longer on Lasix.  We also discussed stopping potassium if levels trending up since off las  Hyperlipidemia Hyperlipidemia-controlled on atorvastatin 40 mg daily in past-update lipid panel  to verify controlled today. LDL goal under 70  Diastolic dysfunction Chronic diastolic heart failure- given no need for chronic Lasix-we will change this to diastolic dysfunction   Future Appointments  Date Time Provider [place]  06/12/2018  3:40 PM Dorothy Spark, MD [facility]  06/22/2018 12:30 PM [name], PA-C [facility] None  06/30/2018 10:50 AM Pieter Partridge, DO [facility] None  10/02/2018  3:00 PM Parrett, Fonnie Mu, NP [facility] None   Return in about 4 months (around 10/04/2018) for follow up- or sooner if needed. Or 6 months if a1c 7 or less which suspect it will be  Lab/Order associations: Fasting Preventative health care - Plan: TSH, Hemoglobin A1c, CBC, Comprehensive metabolic panel, VITAMIN  D 25 Hydroxy (Vit-D Deficiency, Fractures), Insulin, random, Lipid panel, CANCELED: LDL  cholesterol, direct  Controlled type 2 diabetes mellitus without complication, without long-term current use of insulin ([facility]) - Plan: Insulin, random, CANCELED: LDL cholesterol, direct  History of CVA in adulthood  Essential hypertension  Osteopenia of both thighs  Generalized anxiety disorder  Mixed hyperlipidemia - Plan: CBC, Comprehensive metabolic panel, Lipid panel, CANCELED: LDL cholesterol, direct  Hyponatremia  Vitamin D deficiency - Plan: VITAMIN D 25 Hydroxy (Vit-D Deficiency, Fractures)  Elevated TSH - Plan: TSH  Diastolic dysfunction  Return precautions advised.  Garret Reddish, MD

## 2018-06-04 NOTE — Assessment & Plan Note (Signed)
Hypertension-controlled on  metoprolol 12.5 mg twice daily.  No longer on Lasix.  We also discussed stopping potassium if levels trending up since off las

## 2018-06-04 NOTE — Patient Instructions (Addendum)
Please stop by lab before you go   Congrats again on all your hard work and the payoff from that- keep up the great work- try to add in exercise.   4 months if a1c 7 or above, I think 6 months is ok otherwise- you are doing so well! Let us know if you need [name] sooner

## 2018-06-04 NOTE — Assessment & Plan Note (Signed)
Hyperlipidemia-controlled on atorvastatin 40 mg daily in past-update lipid panel  to verify controlled today. LDL goal under 70

## 2018-06-04 NOTE — Assessment & Plan Note (Signed)
S: Well controlled last visit on metformin 500 mg twice a day  Lab Results  Component Value Date   HGBA1C 6.6 (H) 01/19/2018   HGBA1C 6.2 (A) 11/25/2017   HGBA1C 6.4 05/23/2017   A/P: Hopefully stable-update A1c -Foot exam completed today  - weigh to wellness wants insulin random- will order but told patient not 100% sure what utility this has for her- they can inform her though

## 2018-06-04 NOTE — Assessment & Plan Note (Signed)
Chronic diastolic heart failure- given no need for chronic Lasix-we will change this to diastolic dysfunction

## 2018-06-05 ENCOUNTER — Encounter: Payer: Self-pay | Admitting: Family Medicine

## 2018-06-05 LAB — INSULIN, RANDOM: Insulin: 4.3 u[IU]/mL (ref 2.0–19.6)

## 2018-06-06 ENCOUNTER — Other Ambulatory Visit: Payer: Self-pay | Admitting: Cardiology

## 2018-06-06 DIAGNOSIS — Z953 Presence of xenogenic heart valve: Secondary | ICD-10-CM

## 2018-06-08 ENCOUNTER — Telehealth: Payer: Self-pay

## 2018-06-12 ENCOUNTER — Encounter

## 2018-06-12 ENCOUNTER — Encounter: Payer: Self-pay | Admitting: Cardiology

## 2018-06-12 ENCOUNTER — Ambulatory Visit: Payer: Medicare Other | Admitting: Cardiology

## 2018-06-12 VITALS — BP 146/56 | HR 54 | Ht 65.0 in | Wt 211.0 lb

## 2018-06-12 DIAGNOSIS — I5032 Chronic diastolic (congestive) heart failure: Secondary | ICD-10-CM

## 2018-06-12 DIAGNOSIS — I10 Essential (primary) hypertension: Secondary | ICD-10-CM | POA: Diagnosis not present

## 2018-06-12 DIAGNOSIS — E782 Mixed hyperlipidemia: Secondary | ICD-10-CM

## 2018-06-12 DIAGNOSIS — G4733 Obstructive sleep apnea (adult) (pediatric): Secondary | ICD-10-CM

## 2018-06-12 DIAGNOSIS — Z953 Presence of xenogenic heart valve: Secondary | ICD-10-CM

## 2018-06-12 MED ORDER — ASPIRIN EC 81 MG PO TBEC: 81.0000 mg | DELAYED_RELEASE_TABLET | Freq: Every day | ORAL | 3 refills | Status: DC

## 2018-06-12 NOTE — Patient Instructions (Addendum)
Medication Instructions:   STOP TAKING LASIX AND POTASSIUM CHLORIDE   START TAKING ASPIRIN 81 MG ONCE DAILY  If you need a refill on your cardiac medications before your next appointment, please call your pharmacy.      Testing/Procedures:  Your physician has requested that you have an echocardiogram. Echocardiography is a painless test that uses sound waves to create images of your heart. It provides your doctor with information about the size and shape of your heart and how well your heart's chambers and valves are working. This procedure takes approximately one hour. There are no restrictions for this procedure.  SCHEDULE ECHO PRIOR TO ONE YEAR FOLLOW-UP APPOINTMENT WITH DR NELSON--CHECK-OUT PLEASE SCHEDULE THIS TEST TODAY    Follow-Up:  Your physician wants you to follow-up in: [place] will receive a reminder letter in the mail two months in advance. If you don't receive a letter, please call our office to schedule the follow-up appointment.  PLEASE HAVE YOUR ECHO SCHEDULED PRIOR TO THIS OFFICE VISIT--CHECKOUT PLEASE SCHEDULE HER ECHO TODAY, FOR ONE YEAR OUT

## 2018-06-12 NOTE — Progress Notes (Signed)
Cardiology Office Note:    Date:  06/12/2018   ID:  [name], DOB Oct 06, 1950, MRN [id number]  PCP:  [name], MD  Cardiologist:  Dr Meda Coffee  Referring MD: [name], MD   Reason for visit: 1 year follow-up  History of Present Illness:    [name] is a 67 y.o. female with a hx of Diabetes, hyperlipidemia, OSA on CPAP, and aortic valve replacement with bioprosthetic bovine pericardial tissue valve 08/23/16. Patient had only minor nonobstructive CAD on heart catheterization 08/02/16 done in preparation for AVR. Also right heart cath showed normal right heart hemodynamics. Echocardiogram showed normal EF of 60-65% and grade 1 diastolic dysfunction. Pt is feeling very well and much better after the valve replacement than before. Completed 8 weeks of cardiac rehab and working out at [employer] (has taken a week off due to having a mole on her leg removed).  June 12, 2018 -the patient is coming after 1 year, she is feeling and looking great, she completed cardiac rehab however is not exercising on a regular basis now.  She joined the wellness clinic at [facility] and has lost 25 pounds so far but still wants to lose another 30.  She denies any chest pain, shortness of breath, no orthopnea or proximal nocturnal dyspnea.  She has been compliant with her meds and has no side effects.  Past Medical History:  Diagnosis Date  . Allergy   . Anxiety   . Arthritis    back & knee  . Asthma     mild per pt shows up with resp illness  . Chronic diastolic congestive heart failure ([place])   . Colon polyps   . Constipation   . Diabetes mellitus without complication ([name])   . Dyspnea   . Gastric polyps   . Gastroparesis   . GERD (gastroesophageal reflux disease)   . Headache    sinus headaches and migraines at times  . Heart murmur   . History of migraine headaches   . HTN (hypertension)   . Hyperlipidemia   . IBS (irritable bowel syndrome)   . Joint pain   .  Lumbar disc disease   . S/P aortic valve replacement with bioprosthetic valve 08/23/2016   25 mm Edwards Intuity rapid-deployment bovine pericardial tissue valve via partial upper mini sternotomy  . Sleep apnea   . TIA (transient ischemic attack)     Past Surgical History:  Procedure Laterality Date  . ABDOMINAL HYSTERECTOMY    . AORTIC VALVE REPLACEMENT N/A 08/23/2016   Procedure: AORTIC VALVE REPLACEMENT (AVR) - using partial Upper Sternotomy- 76mm Edwards Intuity Aortic Valve used;  Surgeon: Rexene Alberts, MD;  Location: [place];  Service: Open Heart Surgery;  Laterality: N/A;  . BLADDER SUSPENSION  2007  . BREAST ENHANCEMENT SURGERY  1980  . CARPAL TUNNEL RELEASE Bilateral   . COLONOSCOPY    . DORSAL COMPARTMENT RELEASE Right 09/15/2014   Procedure: RIGHT WRIST DEQUERVAINS RELEASE ;  Surgeon: Kathryne Hitch, MD;  Location: [place];  Service: Orthopedics;  Laterality: Right;  . ESOPHAGOGASTRODUODENOSCOPY    . KNEE ARTHROSCOPY  04/15/2012   Procedure: ARTHROSCOPY KNEE;  Surgeon: [name], MD;  Location: [place];  Service: Orthopedics;  Laterality: Right;  . LAPAROSCOPIC CHOLECYSTECTOMY    . LASIK    . OOPHORECTOMY    . PALATE TO GINGIVA GRAFT  2017  . RIGHT/LEFT HEART CATH AND CORONARY ANGIOGRAPHY N/A 08/02/2016   Procedure: Right/Left Heart  Cath and Coronary Angiography;  Surgeon: Sherren Mocha, MD;  Location: [facility];  Service: Cardiovascular;  Laterality: N/A;  . ROTATOR CUFF REPAIR Left   . TEE WITHOUT CARDIOVERSION N/A 08/23/2016   Procedure: TRANSESOPHAGEAL ECHOCARDIOGRAM (TEE);  Surgeon: Rexene Alberts, MD;  Location: [place];  Service: Open Heart Surgery;  Laterality: N/A;  . TOTAL KNEE ARTHROPLASTY  04/15/2012   Procedure: TOTAL KNEE ARTHROPLASTY;  Surgeon: [name], MD;  Location: [place];  Service: Orthopedics;  Laterality: Right;  . TRIGGER FINGER RELEASE Right 04/20/2015   Procedure: RIGHT TRIGGER FINGER RELEASE (TENDON SHEALTH INCISION) ;   Surgeon: [name], MD;  Location: [place];  Service: Orthopedics;  Laterality: Right;    Current Medications: Current Meds  Medication Sig  . atorvastatin (LIPITOR) 40 MG tablet Take 1 tablet (40 mg total) by mouth daily at 6 PM.  . Biotin 5000 MCG TABS Take 5,000 mcg by mouth daily.   . calcium-vitamin D (OSCAL WITH D) 500-200 MG-UNIT per tablet Take 2 tablets by mouth daily.   . clindamycin (CLEOCIN) 300 MG capsule TAKE 2 CAPSULES BY MOUTH 1 HOUR PRIOR TOO DENTAL CLEANING.  [name] clopidogrel (PLAVIX) 75 MG tablet Take 1 tablet (75 mg total) by mouth daily.  . fluticasone (FLONASE) 50 MCG/ACT nasal spray Place 2 sprays into both nostrils daily.   [name] glucose blood (ONETOUCH VERIO) test strip Use to test blood sugars daily. Dx: E11.9  . Insulin Pen Needle (BD PEN NEEDLE NANO 2ND GEN) 32G X 4 MM MISC 1 Package by Does not apply route 2 (two) times daily.  . Lancets (ONETOUCH ULTRASOFT) lancets Use to test blood sugars daily. Dx: E11.9  . loratadine (CLARITIN) 10 MG tablet Take 10 mg by mouth at bedtime.   . metFORMIN (GLUCOPHAGE) 500 MG tablet TAKE 1 TABLET WITH BREAKFAST AND 1/2 TO 1 TABLET WITH DINNER.  [name] metoCLOPramide (REGLAN) 10 MG tablet TAKE 1/2 TABLET BY MOUTH 4 TIMES A DAY  . metoprolol tartrate (LOPRESSOR) 25 MG tablet TAKE 1/2 TAB BY MOUTH TWICE A DAY  . Multiple Vitamin (MULTIVITAMIN WITH MINERALS) TABS tablet Take 1 tablet by mouth daily.  [name] omeprazole (PRILOSEC OTC) 20 MG tablet Take 40 mg by mouth daily with breakfast.  . Probiotic Product (ALIGN) 4 MG CAPS Take 4 mg by mouth daily.   . psyllium (METAMUCIL SMOOTH TEXTURE) 28 % packet Take 1 packet by mouth daily before breakfast.   . venlafaxine XR (EFFEXOR-XR) 75 MG 24 hr capsule TAKE 1 CAPSULE (75 MG TOTAL) BY MOUTH DAILY WITH BREAKFAST.  [name] Vitamin D, Ergocalciferol, (DRISDOL) 1.25 MG (50000 UT) CAPS capsule Take 1 capsule (50,000 Units total) by mouth every 7 (seven) days.  . [DISCONTINUED] KLOR-CON M20 20  MEQ tablet TAKE 1 TABLET BY MOUTH EVERY DAY     Allergies:   Augmentin [amoxicillin-pot clavulanate]; Erythromycin; and Penicillins   Social History   Socioeconomic History  . Marital status: Married    Spouse name: [name]  . Number of children: 1  . Years of education: Not on file  . Highest education level: Not on file  Occupational History  . Occupation: Retired, Curator: RETIRED  Social Needs  . Financial resource strain: Not on file  . Food insecurity:    Worry: Not on file    Inability: Not on file  . Transportation needs:    Medical: Not on file    Non-medical: Not on file  Tobacco Use  . Smoking status: Never Smoker  . Smokeless tobacco: Never Used  Substance and Sexual Activity  . Alcohol use: No  . Drug use: No  . Sexual activity: Yes  Lifestyle  . Physical activity:    Days per week: Not on file    Minutes per session: Not on file  . Stress: Not on file  Relationships  . Social connections:    Talks on phone: Not on file    Gets together: Not on file    Attends religious service: Not on file    Active member of club or organization: Not on file    Attends meetings of clubs or organizations: Not on file    Relationship status: Not on file  Other Topics Concern  . Not on file  Social History Narrative   She lives with husband (1978) and two dogs. Step-son [name] (1971-psychiatrist in [place]). No grandkids. 2 dogs-sheltie/collie mix and border collie mix      Highest level of education:  Master in education   She is retired Animal nutritionist x 32 years.      Hobbies: time with dogs              Family History: The patient's family history includes Alcohol abuse in her father; Anxiety disorder in her mother; COPD in her mother; Colon polyps in her maternal aunt and mother; Heart disease in her father; Irritable bowel syndrome in her mother. ROS:   Please see the history of present illness.     All other systems reviewed  and are negative.  EKGs/Labs/Other Studies Reviewed:    The following studies were reviewed today:  Right/Left Heart Cath and Coronary Angiography 08/02/16   1. Minor nonobstructive CAD 2. Normal right heart hemodynamics (including normal PCWP) 3. Known severe aortic stenosis    Intraoperative TEE 08/23/16  Left ventricle: Normal cavity size. Concentric hypertrophy of moderate severity. LV systolic function is normal with an EF of 60-65%. There are no obvious wall motion abnormalities.  Septum: No Patent Foramen Ovale present.  Left atrium: Patent foramen ovale not present.  Aortic valve: The valve is bicuspid. Severe valve thickening present. Severe valve calcification present. Moderately decreased leaflet separation. Severe stenosis. No regurgitation. No AV vegetation. No evidence of papillary fibroelastoma.  Aorta: The ascending aorta is mildly dilated.  Mitral valve: No leaflet thickening and calcification present. Mild mitral annular calcification.  Right ventricle: Normal cavity size, wall thickness and ejection fraction.  Tricuspid valve: Trace regurgitation. The tricuspid valve regurgitation jet is central.   EKG:  EKG is ordered today and personally reviewed, which shows sinus bradycardia 58 bpm, LVH unchanged from prior.    Recent Labs: 06/04/2018: ALT 15; BUN 18; Creatinine, Ser 0.61; Hemoglobin 11.8; Platelets 346.0; Potassium 4.6; Sodium 131; TSH 3.38   Recent Lipid Panel    Component Value Date/Time   CHOL 116 06/04/2018 1012   CHOL 123 01/19/2018 0959   TRIG 70.0 06/04/2018 1012   HDL 47.40 06/04/2018 1012   HDL 54 01/19/2018 0959   CHOLHDL 2 06/04/2018 1012   VLDL 14.0 06/04/2018 1012   LDLCALC 54 06/04/2018 1012   LDLCALC 54 01/19/2018 0959   LDLDIRECT 58.0 11/25/2017 1156    Physical Exam:    VS:  BP (!) 146/56   Pulse (!) 54   Ht 5\' 5"  (1.651 m)   Wt 211 lb (95.7 kg)   LMP  (LMP Unknown)   SpO2 99%   BMI 35.11 kg/m  Wt Readings from  Last 3 Encounters:  06/12/18 211 lb (95.7 kg)  06/04/18 207 lb 3.2 oz (94 kg)  06/02/18 204 lb (92.5 kg)     GEN: Well nourished, well developed in no acute distress HEENT: Normal NECK: No JVD; No carotid bruits LYMPHATICS: No lymphadenopathy CARDIAC: RRR, 2 out of 6 diastolic murmur, rubs, gallops RESPIRATORY:  Clear to auscultation without rales, wheezing or rhonchi  ABDOMEN: Soft, non-tender, non-distended MUSCULOSKELETAL:  No edema; No deformity  SKIN: Warm and dry NEUROLOGIC:  Alert and oriented x 3 PSYCHIATRIC:  Normal affect   ASSESSMENT:    1. S/P minimally invasive aortic valve replacement with bioprosthetic valve   2. Mixed hyperlipidemia   3. Essential hypertension      PLAN:    In order of problems listed above:  S/P AVR with bovine tissue valve 08/23/16 - Has completed 8 weeks of cardiac rehab, she is in a weight loss program and is completely asymptomatic feeling great.  -Her most recent echocardiogram in January 2019 showed mildly elevated transaortic gradients with peak of 34 and a mean of 17 mmHg and mild paravalvular leak.  -Her diastolic murmur appears the same as a year ago, will not repeat echocardiogram now but repeat prior to the next visit in 1 year.  -She is encouraged to start regular exercise.  Chronic diastolic CHF -She is euvolemic of Lasix, will also discontinue potassium.  Her most recent labs showed normal electrolytes and kidney function.  Hyperlipidemia -All lipids at goal on atorvastatin.  OSA -Using CPAP consistently every night.    Medication Adjustments/Labs and Tests Ordered: Current medicines are reviewed at length with the patient today.  Concerns regarding medicines are outlined above. Labs and tests ordered and medication changes are outlined in the patient instructions below:  Patient Instructions  Medication Instructions:   STOP TAKING LASIX AND POTASSIUM CHLORIDE   START TAKING ASPIRIN 81 MG ONCE DAILY  If you need a  refill on your cardiac medications before your next appointment, please call your pharmacy.      Testing/Procedures:  Your physician has requested that you have an echocardiogram. Echocardiography is a painless test that uses sound waves to create images of your heart. It provides your doctor with information about the size and shape of your heart and how well your heart's chambers and valves are working. This procedure takes approximately one hour. There are no restrictions for this procedure.  SCHEDULE ECHO PRIOR TO ONE YEAR FOLLOW-UP APPOINTMENT WITH DR Mardee Clune--CHECK-OUT PLEASE SCHEDULE THIS TEST TODAY    Follow-Up:  Your physician wants you to follow-up in: [place] will receive a reminder letter in the mail two months in advance. If you don't receive a letter, please call our office to schedule the follow-up appointment.  PLEASE HAVE YOUR ECHO SCHEDULED PRIOR TO THIS OFFICE VISIT--CHECKOUT PLEASE SCHEDULE HER ECHO TODAY, FOR ONE YEAR OUT       Signed, Ena Dawley, MD  06/12/2018 4:46 PM    [facility]

## 2018-06-22 ENCOUNTER — Encounter (INDEPENDENT_AMBULATORY_CARE_PROVIDER_SITE_OTHER): Payer: Self-pay | Admitting: Physician Assistant

## 2018-06-22 ENCOUNTER — Ambulatory Visit (INDEPENDENT_AMBULATORY_CARE_PROVIDER_SITE_OTHER): Payer: Medicare Other | Admitting: Physician Assistant

## 2018-06-22 VITALS — BP 129/51 | HR 48 | Temp 98.6°F | Ht 65.0 in | Wt 205.0 lb

## 2018-06-22 DIAGNOSIS — Z6834 Body mass index (BMI) 34.0-34.9, adult: Secondary | ICD-10-CM | POA: Diagnosis not present

## 2018-06-22 DIAGNOSIS — E559 Vitamin D deficiency, unspecified: Secondary | ICD-10-CM

## 2018-06-22 DIAGNOSIS — E119 Type 2 diabetes mellitus without complications: Secondary | ICD-10-CM | POA: Diagnosis not present

## 2018-06-22 DIAGNOSIS — E669 Obesity, unspecified: Secondary | ICD-10-CM | POA: Diagnosis not present

## 2018-06-22 MED ORDER — VITAMIN D (ERGOCALCIFEROL) 1.25 MG (50000 UNIT) PO CAPS: 50000.0000 [IU] | ORAL_CAPSULE | ORAL | 0 refills | Status: DC

## 2018-06-22 NOTE — Progress Notes (Signed)
Office: [phone]  /  Fax: [phone]   HPI:   Chief Complaint: OBESITY [name] is here to discuss her progress with her obesity treatment plan. She is on the Category 3 plan and is following her eating plan approximately 90 % of the time. She states she is exercising 0 minutes 0 times per week. [name] reports that she struggled with chocolate over the holidays. She is ready to get back on track.  Her weight is 205 lb (93 kg) today and has gained 1 pound since her last visit. She has lost 15 lbs since starting treatment with [name].  Diabetes II [name] has a diagnosis of diabetes type II. [name] states fasting BGs range between 115 and 133 and she denies hypoglycemia. She is on metformin and denies nausea, vomiting, or diarrhea. Last A1c was 6.1. She has been working on intensive lifestyle modifications including diet, exercise, and weight loss to help control her blood glucose levels.  Vitamin D Deficiency [name] has a diagnosis of vitamin D deficiency. She is currently taking prescription Vit D and denies nausea, vomiting or muscle weakness.  ALLERGIES: Allergies  Allergen Reactions  . Augmentin [Amoxicillin-Pot Clavulanate] Rash    rash  . Erythromycin Nausea Only  . Penicillins Itching and Rash    Has patient had a PCN reaction causing immediate rash, facial/tongue/throat swelling, SOB or lightheadedness with hypotension: No Has patient had a PCN reaction causing severe rash involving mucus membranes or skin necrosis: No Has patient had a PCN reaction that required hospitalization: No Has patient had a PCN reaction occurring within the last 10 years: No  If all of the above answers are "NO", then may proceed with Cephalosporin use.     MEDICATIONS: Current Outpatient Medications on File Prior to Visit  Medication Sig Dispense Refill  . aspirin EC 81 MG tablet Take 1 tablet (81 mg total) by mouth daily. 90 tablet 3  . atorvastatin (LIPITOR) 40 MG tablet  Take 1 tablet (40 mg total) by mouth daily at 6 PM. 90 tablet 3  . Biotin 5000 MCG TABS Take 5,000 mcg by mouth daily.     . calcium-vitamin D (OSCAL WITH D) 500-200 MG-UNIT per tablet Take 2 tablets by mouth daily.     . clindamycin (CLEOCIN) 300 MG capsule TAKE 2 CAPSULES BY MOUTH 1 HOUR PRIOR TOO DENTAL CLEANING. 2 capsule 0  . clopidogrel (PLAVIX) 75 MG tablet Take 1 tablet (75 mg total) by mouth daily. 90 tablet 3  . fluticasone (FLONASE) 50 MCG/ACT nasal spray Place 2 sprays into both nostrils daily.     [name] glucose blood (ONETOUCH VERIO) test strip Use to test blood sugars daily. Dx: E11.9 100 each 0  . Insulin Pen Needle (BD PEN NEEDLE NANO 2ND GEN) 32G X 4 MM MISC 1 Package by Does not apply route 2 (two) times daily. 100 each 0  . Lancets (ONETOUCH ULTRASOFT) lancets Use to test blood sugars daily. Dx: E11.9 100 each 12  . loratadine (CLARITIN) 10 MG tablet Take 10 mg by mouth at bedtime.     . metFORMIN (GLUCOPHAGE) 500 MG tablet TAKE 1 TABLET WITH BREAKFAST AND 1/2 TO 1 TABLET WITH DINNER. 180 tablet 1  . metoCLOPramide (REGLAN) 10 MG tablet TAKE 1/2 TABLET BY MOUTH 4 TIMES A DAY 60 tablet 5  . metoprolol tartrate (LOPRESSOR) 25 MG tablet TAKE 1/2 TAB BY MOUTH TWICE A DAY 30 tablet 5  . Multiple Vitamin (MULTIVITAMIN WITH MINERALS) TABS tablet Take 1 tablet by mouth  daily.    . omeprazole (PRILOSEC OTC) 20 MG tablet Take 40 mg by mouth daily with breakfast.    . Probiotic Product (ALIGN) 4 MG CAPS Take 4 mg by mouth daily.     . psyllium (METAMUCIL SMOOTH TEXTURE) 28 % packet Take 1 packet by mouth daily before breakfast.     . venlafaxine XR (EFFEXOR-XR) 75 MG 24 hr capsule TAKE 1 CAPSULE (75 MG TOTAL) BY MOUTH DAILY WITH BREAKFAST. 90 capsule 2  . Vitamin D, Ergocalciferol, (DRISDOL) 1.25 MG (50000 UT) CAPS capsule Take 1 capsule (50,000 Units total) by mouth every 7 (seven) days. 4 capsule 0   No current facility-administered medications on file prior to visit.     PAST MEDICAL  HISTORY: Past Medical History:  Diagnosis Date  . Allergy   . Anxiety   . Arthritis    back & knee  . Asthma     mild per pt shows up with resp illness  . Chronic diastolic congestive heart failure ([facility])   . Colon polyps   . Constipation   . Diabetes mellitus without complication ([facility])   . Dyspnea   . Gastric polyps   . Gastroparesis   . GERD (gastroesophageal reflux disease)   . Headache    sinus headaches and migraines at times  . Heart murmur   . History of migraine headaches   . HTN (hypertension)   . Hyperlipidemia   . IBS (irritable bowel syndrome)   . Joint pain   . Lumbar disc disease   . S/P aortic valve replacement with bioprosthetic valve 08/23/2016   25 mm Edwards Intuity rapid-deployment bovine pericardial tissue valve via partial upper mini sternotomy  . Sleep apnea   . TIA (transient ischemic attack)     PAST SURGICAL HISTORY: Past Surgical History:  Procedure Laterality Date  . ABDOMINAL HYSTERECTOMY    . AORTIC VALVE REPLACEMENT N/A 08/23/2016   Procedure: AORTIC VALVE REPLACEMENT (AVR) - using partial Upper Sternotomy- 14mm Edwards Intuity Aortic Valve used;  Surgeon: Rexene Alberts, MD;  Location: [place];  Service: Open Heart Surgery;  Laterality: N/A;  . BLADDER SUSPENSION  2007  . BREAST ENHANCEMENT SURGERY  1980  . CARPAL TUNNEL RELEASE Bilateral   . COLONOSCOPY    . DORSAL COMPARTMENT RELEASE Right 09/15/2014   Procedure: RIGHT WRIST DEQUERVAINS RELEASE ;  Surgeon: Kathryne Hitch, MD;  Location: [place];  Service: Orthopedics;  Laterality: Right;  . ESOPHAGOGASTRODUODENOSCOPY    . KNEE ARTHROSCOPY  04/15/2012   Procedure: ARTHROSCOPY KNEE;  Surgeon: [name], MD;  Location: [place];  Service: Orthopedics;  Laterality: Right;  . LAPAROSCOPIC CHOLECYSTECTOMY    . LASIK    . OOPHORECTOMY    . PALATE TO GINGIVA GRAFT  2017  . RIGHT/LEFT HEART CATH AND CORONARY ANGIOGRAPHY N/A 08/02/2016   Procedure: Right/Left Heart Cath and  Coronary Angiography;  Surgeon: Sherren Mocha, MD;  Location: [facility];  Service: Cardiovascular;  Laterality: N/A;  . ROTATOR CUFF REPAIR Left   . TEE WITHOUT CARDIOVERSION N/A 08/23/2016   Procedure: TRANSESOPHAGEAL ECHOCARDIOGRAM (TEE);  Surgeon: Rexene Alberts, MD;  Location: [place];  Service: Open Heart Surgery;  Laterality: N/A;  . TOTAL KNEE ARTHROPLASTY  04/15/2012   Procedure: TOTAL KNEE ARTHROPLASTY;  Surgeon: [name], MD;  Location: [place];  Service: Orthopedics;  Laterality: Right;  . TRIGGER FINGER RELEASE Right 04/20/2015   Procedure: RIGHT TRIGGER FINGER RELEASE (TENDON SHEALTH INCISION) ;  Surgeon:  [name], MD;  Location: [place];  Service: Orthopedics;  Laterality: Right;    SOCIAL HISTORY: Social History   Tobacco Use  . Smoking status: Never Smoker  . Smokeless tobacco: Never Used  Substance Use Topics  . Alcohol use: No  . Drug use: No    FAMILY HISTORY: Family History  Problem Relation Age of Onset  . COPD Mother   . Colon polyps Mother   . Irritable bowel syndrome Mother   . Anxiety disorder Mother   . Heart disease Father   . Alcohol abuse Father   . Colon polyps Maternal Aunt     ROS: Review of Systems  Constitutional: Negative for weight loss.  Gastrointestinal: Negative for diarrhea, nausea and vomiting.  Musculoskeletal:       Negative muscle weakness    PHYSICAL EXAM: Blood pressure (!) 129/51, pulse (!) 48, temperature 98.6 F (37 C), temperature source Oral, height 5\' 5"  (1.651 m), weight 205 lb (93 kg), SpO2 99 %. Body mass index is 34.11 kg/m. Physical Exam Vitals signs reviewed.  Constitutional:      Appearance: Normal appearance. She is obese.  Cardiovascular:     Rate and Rhythm: Normal rate.     Pulses: Normal pulses.  Pulmonary:     Effort: Pulmonary effort is normal.  Musculoskeletal: Normal range of motion.  Skin:    General: Skin is warm and dry.  Neurological:     Mental  Status: She is alert and oriented to person, place, and time.  Psychiatric:        Mood and Affect: Mood normal.        Behavior: Behavior normal.     RECENT LABS AND TESTS: BMET    Component Value Date/Time   NA 131 (L) 06/04/2018 1012   NA 129 (L) 01/19/2018 0959   K 4.6 06/04/2018 1012   CL 97 06/04/2018 1012   CO2 27 06/04/2018 1012   GLUCOSE 109 (H) 06/04/2018 1012   BUN 18 06/04/2018 1012   BUN 16 01/19/2018 0959   CREATININE 0.61 06/04/2018 1012   CALCIUM 9.1 06/04/2018 1012   GFRNONAA 79 01/19/2018 0959   GFRAA 91 01/19/2018 0959   Lab Results  Component Value Date   HGBA1C 6.1 06/04/2018   HGBA1C 6.6 (H) 01/19/2018   HGBA1C 6.2 (A) 11/25/2017   HGBA1C 6.4 05/23/2017   HGBA1C 6.7 (H) 01/02/2017   Lab Results  Component Value Date   INSULIN 12.1 01/19/2018   CBC    Component Value Date/Time   WBC 8.8 06/04/2018 1012   RBC 4.51 06/04/2018 1012   HGB 11.8 (L) 06/04/2018 1012   HGB 11.7 01/19/2018 0959   HCT 36.3 06/04/2018 1012   HCT 35.7 01/19/2018 0959   PLT 346.0 06/04/2018 1012   PLT 358 04/24/2017 1231   MCV 80.5 06/04/2018 1012   MCV 81 01/19/2018 0959   MCH 26.5 (L) 01/19/2018 0959   MCH 27.4 08/26/2016 0248   MCHC 32.5 06/04/2018 1012   RDW 15.7 (H) 06/04/2018 1012   RDW 14.9 01/19/2018 0959   LYMPHSABS 2.9 01/19/2018 0959   MONOABS 0.6 05/11/2015 0836   EOSABS 0.1 01/19/2018 0959   BASOSABS 0.0 01/19/2018 0959   Iron/TIBC/Ferritin/ %Sat No results found for: IRON, TIBC, FERRITIN, IRONPCTSAT Lipid Panel     Component Value Date/Time   CHOL 116 06/04/2018 1012   CHOL 123 01/19/2018 0959   TRIG 70.0 06/04/2018 1012   HDL 47.40 06/04/2018 1012   HDL  54 01/19/2018 0959   CHOLHDL 2 06/04/2018 1012   VLDL 14.0 06/04/2018 1012   LDLCALC 54 06/04/2018 1012   LDLCALC 54 01/19/2018 0959   LDLDIRECT 58.0 11/25/2017 1156   Hepatic Function Panel     Component Value Date/Time   PROT 6.5 06/04/2018 1012   PROT 6.6 01/19/2018 0959    ALBUMIN 4.4 06/04/2018 1012   ALBUMIN 4.6 01/19/2018 0959   AST 22 06/04/2018 1012   ALT 15 06/04/2018 1012   ALKPHOS 151 (H) 06/04/2018 1012   BILITOT 0.4 06/04/2018 1012   BILITOT 0.5 01/19/2018 0959   BILIDIR 0.1 05/11/2015 0836      Component Value Date/Time   TSH 3.38 06/04/2018 1012   TSH 5.300 (H) 01/19/2018 0959   TSH 3.390 04/24/2017 1231    ASSESSMENT AND PLAN: Type 2 diabetes mellitus without complication, without long-term current use of insulin (HCC)  Vitamin D deficiency - Plan: Vitamin D, Ergocalciferol, (DRISDOL) 1.25 MG (50000 UT) CAPS capsule  Class 1 obesity with serious comorbidity and body mass index (BMI) of 34.0 to 34.9 in adult, unspecified obesity type  PLAN:  Diabetes II Najwa has been given extensive diabetes education by myself today including ideal fasting and post-prandial blood glucose readings, individual ideal Hgb A1c goals and hypoglycemia prevention. We discussed the importance of good blood sugar control to decrease the likelihood of diabetic complications such as nephropathy, neuropathy, limb loss, blindness, coronary artery disease, and death. We discussed the importance of intensive lifestyle modification including diet, exercise and weight loss as the first line treatment for diabetes. Ariatna agrees to continue taking metformin, and she agrees to follow up with our clinic in 2 weeks.  Vitamin D Deficiency Kinsly was informed that low vitamin D levels contributes to fatigue and are associated with obesity, breast, and colon cancer. Kelseigh agrees to continue taking prescription Vit D @50 ,000 IU every week #4 and we will refill for 1 month. She will follow up for routine testing of vitamin D, at least 2-3 times per year. She was informed of the risk of over-replacement of vitamin D and agrees to not increase her dose unless she discusses this with [name] first. Layliana agrees to follow up with our clinic in 2  weeks.  Obesity Latarsha is currently in the action stage of change. As such, her goal is to continue with weight loss efforts She has agreed to follow the Category 3 plan Valencia has been instructed to work up to a goal of 150 minutes of combined cardio and strengthening exercise per week for weight loss and overall health benefits. We discussed the following Behavioral Modification Strategies today: work on meal planning and easy cooking plans and keeping healthy foods in the home   Lezlee has agreed to follow up with our clinic in 2 weeks. She was informed of the importance of frequent follow up visits to maximize her success with intensive lifestyle modifications for her multiple health conditions.   OBESITY BEHAVIORAL INTERVENTION VISIT  Today's visit was # 11   Starting weight: 220 lbs Starting date: 01/19/18 Today's weight : 205 lbs  Today's date: 06/22/2018 Total lbs lost to date: 15 At least 15 minutes were spent on discussing the following behavioral intervention visit.   ASK: We discussed the diagnosis of obesity with Kathaleya P Bazen today and Jamala agreed to give [name] permission to discuss obesity behavioral modification therapy today.  ASSESS: Reign has the diagnosis of obesity and her BMI today is 34.11 Perle is in the  action stage of change   ADVISE: Elisandra was educated on the multiple health risks of obesity as well as the benefit of weight loss to improve her health. She was advised of the need for long term treatment and the importance of lifestyle modifications.  AGREE: Multiple dietary modification options and treatment options were discussed and  Vandella agreed to the above obesity treatment plan.  Wilhemena Durie, am acting as transcriptionist for [name], PA-CI, [name], PA-C have reviewed above note and agree with its content

## 2018-06-30 ENCOUNTER — Ambulatory Visit: Payer: Medicare Other | Admitting: Neurology

## 2018-07-07 ENCOUNTER — Ambulatory Visit (INDEPENDENT_AMBULATORY_CARE_PROVIDER_SITE_OTHER): Payer: Medicare Other | Admitting: Physician Assistant

## 2018-07-07 ENCOUNTER — Encounter (INDEPENDENT_AMBULATORY_CARE_PROVIDER_SITE_OTHER): Payer: Self-pay | Admitting: Physician Assistant

## 2018-07-07 VITALS — BP 147/70 | HR 52 | Temp 98.7°F | Ht 65.0 in | Wt 203.0 lb

## 2018-07-07 DIAGNOSIS — E669 Obesity, unspecified: Secondary | ICD-10-CM

## 2018-07-07 DIAGNOSIS — Z6833 Body mass index (BMI) 33.0-33.9, adult: Secondary | ICD-10-CM

## 2018-07-07 DIAGNOSIS — E119 Type 2 diabetes mellitus without complications: Secondary | ICD-10-CM

## 2018-07-08 NOTE — Progress Notes (Signed)
Office: [phone]  /  Fax: [phone]   HPI:   Chief Complaint: OBESITY [name] is here to discuss her progress with her obesity treatment plan. She is on the Category 3 plan and is following her eating plan approximately 85 % of the time. She states she is exercising 0 minutes 0 times per week. [name] did well with weight loss. She reports that she is working hard to get more water in during the day.  Her weight is 203 lb (92.1 kg) today and has had a weight loss of 2 pounds over a period of 2 weeks since her last visit. She has lost 17 lbs since starting treatment with [name].  Diabetes II [name] has a diagnosis of diabetes type II. Electa states fasting BGs range between 113 and 135. She denies hypoglycemia or polyphagia. Last A1c was 6.1. She has been working on intensive lifestyle modifications including diet, exercise, and weight loss to help control her blood glucose levels.  ALLERGIES: Allergies  Allergen Reactions  . Augmentin [Amoxicillin-Pot Clavulanate] Rash    rash  . Erythromycin Nausea Only  . Penicillins Itching and Rash    Has patient had a PCN reaction causing immediate rash, facial/tongue/throat swelling, SOB or lightheadedness with hypotension: No Has patient had a PCN reaction causing severe rash involving mucus membranes or skin necrosis: No Has patient had a PCN reaction that required hospitalization: No Has patient had a PCN reaction occurring within the last 10 years: No  If all of the above answers are "NO", then may proceed with Cephalosporin use.     MEDICATIONS: Current Outpatient Medications on File Prior to Visit  Medication Sig Dispense Refill  . aspirin EC 81 MG tablet Take 1 tablet (81 mg total) by mouth daily. 90 tablet 3  . atorvastatin (LIPITOR) 40 MG tablet Take 1 tablet (40 mg total) by mouth daily at 6 PM. 90 tablet 3  . Biotin 5000 MCG TABS Take 5,000 mcg by mouth daily.     . calcium-vitamin D (OSCAL WITH D) 500-200  MG-UNIT per tablet Take 2 tablets by mouth daily.     . clindamycin (CLEOCIN) 300 MG capsule TAKE 2 CAPSULES BY MOUTH 1 HOUR PRIOR TOO DENTAL CLEANING. 2 capsule 0  . clopidogrel (PLAVIX) 75 MG tablet Take 1 tablet (75 mg total) by mouth daily. 90 tablet 3  . fluticasone (FLONASE) 50 MCG/ACT nasal spray Place 2 sprays into both nostrils daily.     [name] glucose blood (ONETOUCH VERIO) test strip Use to test blood sugars daily. Dx: E11.9 100 each 0  . Insulin Pen Needle (BD PEN NEEDLE NANO 2ND GEN) 32G X 4 MM MISC 1 Package by Does not apply route 2 (two) times daily. 100 each 0  . Lancets (ONETOUCH ULTRASOFT) lancets Use to test blood sugars daily. Dx: E11.9 100 each 12  . loratadine (CLARITIN) 10 MG tablet Take 10 mg by mouth at bedtime.     . metFORMIN (GLUCOPHAGE) 500 MG tablet TAKE 1 TABLET WITH BREAKFAST AND 1/2 TO 1 TABLET WITH DINNER. 180 tablet 1  . metoCLOPramide (REGLAN) 10 MG tablet TAKE 1/2 TABLET BY MOUTH 4 TIMES A DAY 60 tablet 5  . metoprolol tartrate (LOPRESSOR) 25 MG tablet TAKE 1/2 TAB BY MOUTH TWICE A DAY 30 tablet 5  . Multiple Vitamin (MULTIVITAMIN WITH MINERALS) TABS tablet Take 1 tablet by mouth daily.    [name] omeprazole (PRILOSEC OTC) 20 MG tablet Take 40 mg by mouth daily with breakfast.    .  Probiotic Product (ALIGN) 4 MG CAPS Take 4 mg by mouth daily.     . psyllium (METAMUCIL SMOOTH TEXTURE) 28 % packet Take 1 packet by mouth daily before breakfast.     . venlafaxine XR (EFFEXOR-XR) 75 MG 24 hr capsule TAKE 1 CAPSULE (75 MG TOTAL) BY MOUTH DAILY WITH BREAKFAST. 90 capsule 2  . Vitamin D, Ergocalciferol, (DRISDOL) 1.25 MG (50000 UT) CAPS capsule Take 1 capsule (50,000 Units total) by mouth every 7 (seven) days. 4 capsule 0   No current facility-administered medications on file prior to visit.     PAST MEDICAL HISTORY: Past Medical History:  Diagnosis Date  . Allergy   . Anxiety   . Arthritis    back & knee  . Asthma     mild per pt shows up with resp illness  .  Chronic diastolic congestive heart failure ([place])   . Colon polyps   . Constipation   . Diabetes mellitus without complication ([place])   . Dyspnea   . Gastric polyps   . Gastroparesis   . GERD (gastroesophageal reflux disease)   . Headache    sinus headaches and migraines at times  . Heart murmur   . History of migraine headaches   . HTN (hypertension)   . Hyperlipidemia   . IBS (irritable bowel syndrome)   . Joint pain   . Lumbar disc disease   . S/P aortic valve replacement with bioprosthetic valve 08/23/2016   25 mm Edwards Intuity rapid-deployment bovine pericardial tissue valve via partial upper mini sternotomy  . Sleep apnea   . TIA (transient ischemic attack)     PAST SURGICAL HISTORY: Past Surgical History:  Procedure Laterality Date  . ABDOMINAL HYSTERECTOMY    . AORTIC VALVE REPLACEMENT N/A 08/23/2016   Procedure: AORTIC VALVE REPLACEMENT (AVR) - using partial Upper Sternotomy- 16mm Edwards Intuity Aortic Valve used;  Surgeon: Rexene Alberts, MD;  Location: [facility];  Service: Open Heart Surgery;  Laterality: N/A;  . BLADDER SUSPENSION  2007  . BREAST ENHANCEMENT SURGERY  1980  . CARPAL TUNNEL RELEASE Bilateral   . COLONOSCOPY    . DORSAL COMPARTMENT RELEASE Right 09/15/2014   Procedure: RIGHT WRIST DEQUERVAINS RELEASE ;  Surgeon: Kathryne Hitch, MD;  Location: [facility];  Service: Orthopedics;  Laterality: Right;  . ESOPHAGOGASTRODUODENOSCOPY    . KNEE ARTHROSCOPY  04/15/2012   Procedure: ARTHROSCOPY KNEE;  Surgeon: [name], MD;  Location: [facility];  Service: Orthopedics;  Laterality: Right;  . LAPAROSCOPIC CHOLECYSTECTOMY    . LASIK    . OOPHORECTOMY    . PALATE TO GINGIVA GRAFT  2017  . RIGHT/LEFT HEART CATH AND CORONARY ANGIOGRAPHY N/A 08/02/2016   Procedure: Right/Left Heart Cath and Coronary Angiography;  Surgeon: Sherren Mocha, MD;  Location: [facility];  Service: Cardiovascular;  Laterality: N/A;  . ROTATOR CUFF REPAIR Left   . TEE  WITHOUT CARDIOVERSION N/A 08/23/2016   Procedure: TRANSESOPHAGEAL ECHOCARDIOGRAM (TEE);  Surgeon: Rexene Alberts, MD;  Location: [facility];  Service: Open Heart Surgery;  Laterality: N/A;  . TOTAL KNEE ARTHROPLASTY  04/15/2012   Procedure: TOTAL KNEE ARTHROPLASTY;  Surgeon: [name], MD;  Location: [facility];  Service: Orthopedics;  Laterality: Right;  . TRIGGER FINGER RELEASE Right 04/20/2015   Procedure: RIGHT TRIGGER FINGER RELEASE (TENDON SHEALTH INCISION) ;  Surgeon: [name], MD;  Location: [facility];  Service: Orthopedics;  Laterality: Right;    SOCIAL HISTORY: Social History  Tobacco Use  . Smoking status: Never Smoker  . Smokeless tobacco: Never Used  Substance Use Topics  . Alcohol use: No  . Drug use: No    FAMILY HISTORY: Family History  Problem Relation Age of Onset  . COPD Mother   . Colon polyps Mother   . Irritable bowel syndrome Mother   . Anxiety disorder Mother   . Heart disease Father   . Alcohol abuse Father   . Colon polyps Maternal Aunt     ROS: Review of Systems  Constitutional: Positive for weight loss.  Endo/Heme/Allergies:       Negative hypoglycemia Negative polyphagia    PHYSICAL EXAM: Blood pressure (!) 147/70, pulse (!) 52, temperature 98.7 F (37.1 C), temperature source Oral, height 5\' 5"  (1.651 m), weight 203 lb (92.1 kg), SpO2 98 %. Body mass index is 33.78 kg/m. Physical Exam Vitals signs reviewed.  Constitutional:      Appearance: Normal appearance. She is obese.  Cardiovascular:     Rate and Rhythm: Normal rate.     Pulses: Normal pulses.  Pulmonary:     Effort: Pulmonary effort is normal.     Breath sounds: Normal breath sounds.  Musculoskeletal: Normal range of motion.  Skin:    General: Skin is warm and dry.  Neurological:     Mental Status: She is alert and oriented to person, place, and time.  Psychiatric:        Mood and Affect: Mood normal.        Behavior: Behavior normal.     RECENT  LABS AND TESTS: BMET    Component Value Date/Time   NA 131 (L) 06/04/2018 1012   NA 129 (L) 01/19/2018 0959   K 4.6 06/04/2018 1012   CL 97 06/04/2018 1012   CO2 27 06/04/2018 1012   GLUCOSE 109 (H) 06/04/2018 1012   BUN 18 06/04/2018 1012   BUN 16 01/19/2018 0959   CREATININE 0.61 06/04/2018 1012   CALCIUM 9.1 06/04/2018 1012   GFRNONAA 79 01/19/2018 0959   GFRAA 91 01/19/2018 0959   Lab Results  Component Value Date   HGBA1C 6.1 06/04/2018   HGBA1C 6.6 (H) 01/19/2018   HGBA1C 6.2 (A) 11/25/2017   HGBA1C 6.4 05/23/2017   HGBA1C 6.7 (H) 01/02/2017   Lab Results  Component Value Date   INSULIN 12.1 01/19/2018   CBC    Component Value Date/Time   WBC 8.8 06/04/2018 1012   RBC 4.51 06/04/2018 1012   HGB 11.8 (L) 06/04/2018 1012   HGB 11.7 01/19/2018 0959   HCT 36.3 06/04/2018 1012   HCT 35.7 01/19/2018 0959   PLT 346.0 06/04/2018 1012   PLT 358 04/24/2017 1231   MCV 80.5 06/04/2018 1012   MCV 81 01/19/2018 0959   MCH 26.5 (L) 01/19/2018 0959   MCH 27.4 08/26/2016 0248   MCHC 32.5 06/04/2018 1012   RDW 15.7 (H) 06/04/2018 1012   RDW 14.9 01/19/2018 0959   LYMPHSABS 2.9 01/19/2018 0959   MONOABS 0.6 05/11/2015 0836   EOSABS 0.1 01/19/2018 0959   BASOSABS 0.0 01/19/2018 0959   Iron/TIBC/Ferritin/ %Sat No results found for: IRON, TIBC, FERRITIN, IRONPCTSAT Lipid Panel     Component Value Date/Time   CHOL 116 06/04/2018 1012   CHOL 123 01/19/2018 0959   TRIG 70.0 06/04/2018 1012   HDL 47.40 06/04/2018 1012   HDL 54 01/19/2018 0959   CHOLHDL 2 06/04/2018 1012   VLDL 14.0 06/04/2018 1012   LDLCALC 54 06/04/2018 1012  LDLCALC 54 01/19/2018 0959   LDLDIRECT 58.0 11/25/2017 1156   Hepatic Function Panel     Component Value Date/Time   PROT 6.5 06/04/2018 1012   PROT 6.6 01/19/2018 0959   ALBUMIN 4.4 06/04/2018 1012   ALBUMIN 4.6 01/19/2018 0959   AST 22 06/04/2018 1012   ALT 15 06/04/2018 1012   ALKPHOS 151 (H) 06/04/2018 1012   BILITOT 0.4 06/04/2018  1012   BILITOT 0.5 01/19/2018 0959   BILIDIR 0.1 05/11/2015 0836      Component Value Date/Time   TSH 3.38 06/04/2018 1012   TSH 5.300 (H) 01/19/2018 0959   TSH 3.390 04/24/2017 1231    ASSESSMENT AND PLAN: Type 2 diabetes mellitus without complication, without long-term current use of insulin (HCC)  Class 1 obesity with serious comorbidity and body mass index (BMI) of 33.0 to 33.9 in adult, unspecified obesity type  PLAN:  Diabetes II [name] has been given extensive diabetes education by myself today including ideal fasting and post-prandial blood glucose readings, individual ideal Hgb A1c goals and hypoglycemia prevention. We discussed the importance of good blood sugar control to decrease the likelihood of diabetic complications such as nephropathy, neuropathy, limb loss, blindness, coronary artery disease, and death. We discussed the importance of intensive lifestyle modification including diet, exercise and weight loss as the first line treatment for diabetes. [name] agrees to continue her diabetes medications and weight loss. [name] agrees to follow up with our clinic in 2 weeks.  Obesity [name] is currently in the action stage of change. As such, her goal is to continue with weight loss efforts She has agreed to follow the Category 3 plan [name] has been instructed to work up to a goal of 150 minutes of combined cardio and strengthening exercise per week for weight loss and overall health benefits. We discussed the following Behavioral Modification Strategies today: work on meal planning and easy cooking plans and planning for success   [name] has agreed to follow up with our clinic in 2 weeks. She was informed of the importance of frequent follow up visits to maximize her success with intensive lifestyle modifications for her multiple health conditions.   OBESITY BEHAVIORAL INTERVENTION VISIT  Today's visit was # 12   Starting weight: 220  lbs Starting date: 01/19/18 Today's weight : 203 lbs  Today's date: 07/07/2018 Total lbs lost to date: 17 At least 15 minutes were spent on discussing the following behavioral intervention visit.   ASK: We discussed the diagnosis of obesity with [name] today and [name] agreed to give [name] permission to discuss obesity behavioral modification therapy today.  ASSESS: [name] has the diagnosis of obesity and her BMI today is 33.78 [name] is in the action stage of change   ADVISE: [name] was educated on the multiple health risks of obesity as well as the benefit of weight loss to improve her health. She was advised of the need for long term treatment and the importance of lifestyle modifications.  AGREE: Multiple dietary modification options and treatment options were discussed and  [name] agreed to the above obesity treatment plan.  [name], am acting as transcriptionist for [name], PA-C I, [name], PA-C have reviewed above note and agree with its content

## 2018-07-13 ENCOUNTER — Other Ambulatory Visit (INDEPENDENT_AMBULATORY_CARE_PROVIDER_SITE_OTHER): Payer: Self-pay | Admitting: Physician Assistant

## 2018-07-13 DIAGNOSIS — E559 Vitamin D deficiency, unspecified: Secondary | ICD-10-CM

## 2018-07-15 NOTE — Progress Notes (Signed)
NEUROLOGY FOLLOW UP OFFICE NOTE  [name] [id number]  HISTORY OF PRESENT ILLNESS: [name] is a 68 year old right-handed female with type 2 diabetes, hypertension, hyperlipidemia, status post bovine aortic valve and migraine who follows up for stroke.    UPDATE:  Current medications: Aspirin 81 mg, Plavix, atorvastatin 40 mg, Lopressor, insulin, metformin.  For migraine prophylaxis, she is taking venlafaxine XR 150 mg daily.  She is doing well.  Since last visit, she has had two mild headaches lasting a couple of hours.  Hgb A1c 6.1 and LDL 54 in December She is participating in the weight and wellness program. She has lost 25 lbs since last visit.  However, she is not exercising as much.  HISTORY: Ms. Borgerding has history of migraines since high school.  They are left sided frontal throbbing headaches of moderate to severe intensity.  They typically last 3 hours and occur once a week.  They are preceded by an aura of spots in the vision of both eyes, lasting 2 hours.  They are aggravated by stress and relieved by rest.  She typically takes Imitrex, which is effective.  On 08/07/17, she developed one of her habitual migraines with aura.  However, the headache was more severe and the visual aura did not resolve.  The headache was severe for about 3 days and then tapered off after another 4 days.  The visual aura lasted up to a week.  She did not have any facial droop, unilateral numbness or weakness, ataxia or vertigo.  CT of head from 08/12/17 was personally reviewed and revealed a small patchy hypodensity in the right occipital lobe concerning for subacute infarct.  MRA of head from 08/21/17 was personally reviewed and revealed flow-limiting stenosis versus artifact in the left M2 segement but otherwise intracranial arteries patent, including right PCA.  Carotid doppler from 08/19/17 revealed no hemodynamically significant bilateral ICA stenosis and both vertebral arteries  were patent with antegrade flow.  Echocardiogram from yesterday showed EF 60-65% with no cardiac source of emboli.  She is currently with heart monitor.  Her ASA 81mg  was switched to Plavix 75mg  and she was advised to take atorvastatin daily.  She was advised to stop Imitrex.  Tylenol is ineffective.  She takes venlafaxine XR 150mg  daily for anxiety.  PAST MEDICAL HISTORY: Past Medical History:  Diagnosis Date  . Allergy   . Anxiety   . Arthritis    back & knee  . Asthma     mild per pt shows up with resp illness  . Chronic diastolic congestive heart failure ([place])   . Colon polyps   . Constipation   . Diabetes mellitus without complication ([place])   . Dyspnea   . Gastric polyps   . Gastroparesis   . GERD (gastroesophageal reflux disease)   . Headache    sinus headaches and migraines at times  . Heart murmur   . History of migraine headaches   . HTN (hypertension)   . Hyperlipidemia   . IBS (irritable bowel syndrome)   . Joint pain   . Lumbar disc disease   . S/P aortic valve replacement with bioprosthetic valve 08/23/2016   25 mm Edwards Intuity rapid-deployment bovine pericardial tissue valve via partial upper mini sternotomy  . Sleep apnea   . TIA (transient ischemic attack)    Current Outpatient Medications on File Prior to Visit  Medication Sig Dispense Refill  . aspirin EC 81 MG tablet Take 1 tablet (81 mg total) by  mouth daily. 90 tablet 3  . atorvastatin (LIPITOR) 40 MG tablet Take 1 tablet (40 mg total) by mouth daily at 6 PM. 90 tablet 3  . Biotin 5000 MCG TABS Take 5,000 mcg by mouth daily.     . calcium-vitamin D (OSCAL WITH D) 500-200 MG-UNIT per tablet Take 2 tablets by mouth daily.     . clindamycin (CLEOCIN) 300 MG capsule TAKE 2 CAPSULES BY MOUTH 1 HOUR PRIOR TOO DENTAL CLEANING. 2 capsule 0  . clopidogrel (PLAVIX) 75 MG tablet Take 1 tablet (75 mg total) by mouth daily. 90 tablet 3  . fluticasone (FLONASE) 50 MCG/ACT nasal spray Place 2 sprays into both  nostrils daily.     [name] glucose blood (ONETOUCH VERIO) test strip Use to test blood sugars daily. Dx: E11.9 100 each 0  . Insulin Pen Needle (BD PEN NEEDLE NANO 2ND GEN) 32G X 4 MM MISC 1 Package by Does not apply route 2 (two) times daily. 100 each 0  . Lancets (ONETOUCH ULTRASOFT) lancets Use to test blood sugars daily. Dx: E11.9 100 each 12  . loratadine (CLARITIN) 10 MG tablet Take 10 mg by mouth at bedtime.     . metFORMIN (GLUCOPHAGE) 500 MG tablet TAKE 1 TABLET WITH BREAKFAST AND 1/2 TO 1 TABLET WITH DINNER. 180 tablet 1  . metoCLOPramide (REGLAN) 10 MG tablet TAKE 1/2 TABLET BY MOUTH 4 TIMES A DAY 60 tablet 5  . metoprolol tartrate (LOPRESSOR) 25 MG tablet TAKE 1/2 TAB BY MOUTH TWICE A DAY 30 tablet 5  . Multiple Vitamin (MULTIVITAMIN WITH MINERALS) TABS tablet Take 1 tablet by mouth daily.    [name] omeprazole (PRILOSEC OTC) 20 MG tablet Take 40 mg by mouth daily with breakfast.    . Probiotic Product (ALIGN) 4 MG CAPS Take 4 mg by mouth daily.     . psyllium (METAMUCIL SMOOTH TEXTURE) 28 % packet Take 1 packet by mouth daily before breakfast.     . venlafaxine XR (EFFEXOR-XR) 75 MG 24 hr capsule TAKE 1 CAPSULE (75 MG TOTAL) BY MOUTH DAILY WITH BREAKFAST. 90 capsule 2  . Vitamin D, Ergocalciferol, (DRISDOL) 1.25 MG (50000 UT) CAPS capsule Take 1 capsule (50,000 Units total) by mouth every 7 (seven) days. 4 capsule 0   No current facility-administered medications on file prior to visit.     ALLERGIES: Allergies  Allergen Reactions  . Augmentin [Amoxicillin-Pot Clavulanate] Rash    rash  . Erythromycin Nausea Only  . Penicillins Itching and Rash    Has patient had a PCN reaction causing immediate rash, facial/tongue/throat swelling, SOB or lightheadedness with hypotension: No Has patient had a PCN reaction causing severe rash involving mucus membranes or skin necrosis: No Has patient had a PCN reaction that required hospitalization: No Has patient had a PCN reaction occurring within the  last 10 years: No  If all of the above answers are "NO", then may proceed with Cephalosporin use.     FAMILY HISTORY: Family History  Problem Relation Age of Onset  . COPD Mother   . Colon polyps Mother   . Irritable bowel syndrome Mother   . Anxiety disorder Mother   . Heart disease Father   . Alcohol abuse Father   . Colon polyps Maternal Aunt    SOCIAL HISTORY: Social History   Socioeconomic History  . Marital status: Married    Spouse name: [name]  . Number of children: 1  . Years of education: Not on file  . Highest education level:  Not on file  Occupational History  . Occupation: Retired, Curator: RETIRED  Social Needs  . Financial resource strain: Not on file  . Food insecurity:    Worry: Not on file    Inability: Not on file  . Transportation needs:    Medical: Not on file    Non-medical: Not on file  Tobacco Use  . Smoking status: Never Smoker  . Smokeless tobacco: Never Used  Substance and Sexual Activity  . Alcohol use: No  . Drug use: No  . Sexual activity: Yes  Lifestyle  . Physical activity:    Days per week: Not on file    Minutes per session: Not on file  . Stress: Not on file  Relationships  . Social connections:    Talks on phone: Not on file    Gets together: Not on file    Attends religious service: Not on file    Active member of club or organization: Not on file    Attends meetings of clubs or organizations: Not on file    Relationship status: Not on file  . Intimate partner violence:    Fear of current or ex partner: Not on file    Emotionally abused: Not on file    Physically abused: Not on file    Forced sexual activity: Not on file  Other Topics Concern  . Not on file  Social History Narrative   She lives with husband (1978) and two dogs. Step-son [name] (1971-psychiatrist in [place]). No grandkids. 2 dogs-sheltie/collie mix and border collie mix      Highest level of education:  Master in  education   She is retired Animal nutritionist x 32 years.      Hobbies: time with dogs             REVIEW OF SYSTEMS: Constitutional: No fevers, chills, or sweats, no generalized fatigue, change in appetite Eyes: No visual changes, double vision, eye pain Ear, nose and throat: No hearing loss, ear pain, nasal congestion, sore throat Cardiovascular: No chest pain, palpitations Respiratory:  No shortness of breath at rest or with exertion, wheezes GastrointestinaI: No nausea, vomiting, diarrhea, abdominal pain, fecal incontinence Genitourinary:  No dysuria, urinary retention or frequency Musculoskeletal:  No neck pain, back pain Integumentary: No rash, pruritus, skin lesions Neurological: as above Psychiatric: No depression, insomnia, anxiety Endocrine: No palpitations, fatigue, diaphoresis, mood swings, change in appetite, change in weight, increased thirst Hematologic/Lymphatic:  No purpura, petechiae. Allergic/Immunologic: no itchy/runny eyes, nasal congestion, recent allergic reactions, rashes  PHYSICAL EXAM: Blood pressure 136/60, pulse (!) 51, height 5\' 5"  (1.651 m), weight 205 lb (93 kg), SpO2 98 %. General: No acute distress.  Patient appears well-groomed.  Head:  Normocephalic/atraumatic Eyes:  Fundi examined but not visualized Neck: supple, no paraspinal tenderness, full range of motion Heart:  Regular rate and rhythm Lungs:  Clear to auscultation bilaterally Back: No paraspinal tenderness Neurological Exam: alert and oriented to person, place, and time. Attention span and concentration intact, recent and remote memory intact, fund of knowledge intact.  Speech fluent and not dysarthric, language intact.  CN II-XII intact. Bulk and tone normal, muscle strength 5/5 throughout.  Sensation to light touch, temperature and vibration intact.  Deep tendon reflexes 2+ throughout, toes downgoing.  Finger to nose and heel to shin testing intact.  Gait normal, Romberg  negative.  IMPRESSION: 1.  Migraine with persistent aura, with cerebral infarction 2.  Hypertension 3.  Type  2 diabetes 4.  Hyperlipidemia  PLAN: 1.  Plavix and aspirin 81 mg for secondary stroke prevention 2.  Lipitor 40 mg.  LDL goal less than 70 3.  Medications for blood pressure and glycemic control 4.  Venlafaxine XR 150 mg daily for migraine prophylaxis 5.  For migraine abortive therapy, ibuprofen or Excedrin with Reglan 6.  Follow-up in 6 months  Metta Clines, DO  CC: Garret Reddish, MD

## 2018-07-16 ENCOUNTER — Ambulatory Visit: Payer: Medicare Other | Admitting: Neurology

## 2018-07-16 ENCOUNTER — Encounter: Payer: Self-pay | Admitting: Neurology

## 2018-07-16 VITALS — BP 136/60 | HR 51 | Ht 65.0 in | Wt 205.0 lb

## 2018-07-16 DIAGNOSIS — G43609 Persistent migraine aura with cerebral infarction, not intractable, without status migrainosus: Secondary | ICD-10-CM

## 2018-07-16 DIAGNOSIS — I639 Cerebral infarction, unspecified: Secondary | ICD-10-CM | POA: Diagnosis not present

## 2018-07-16 NOTE — Patient Instructions (Addendum)
1.  Continue venlafaxine XR (Effexor) 150mg  daily 2.  Continue Plavix 75mg  daily and atorvastatin 3.  Continue blood pressure and diabetes medications 4.  Keep up the diet.  Try to increase exercise 5.  Follow up in one year

## 2018-07-23 ENCOUNTER — Ambulatory Visit (INDEPENDENT_AMBULATORY_CARE_PROVIDER_SITE_OTHER): Payer: Medicare Other | Admitting: Physician Assistant

## 2018-07-23 ENCOUNTER — Encounter (INDEPENDENT_AMBULATORY_CARE_PROVIDER_SITE_OTHER): Payer: Self-pay | Admitting: Physician Assistant

## 2018-07-23 VITALS — BP 133/58 | HR 50 | Temp 98.1°F | Ht 65.0 in | Wt 201.0 lb

## 2018-07-23 DIAGNOSIS — Z6833 Body mass index (BMI) 33.0-33.9, adult: Secondary | ICD-10-CM | POA: Diagnosis not present

## 2018-07-23 DIAGNOSIS — E559 Vitamin D deficiency, unspecified: Secondary | ICD-10-CM

## 2018-07-23 DIAGNOSIS — E119 Type 2 diabetes mellitus without complications: Secondary | ICD-10-CM

## 2018-07-23 DIAGNOSIS — E669 Obesity, unspecified: Secondary | ICD-10-CM

## 2018-07-23 MED ORDER — VITAMIN D (ERGOCALCIFEROL) 1.25 MG (50000 UNIT) PO CAPS: 50000.0000 [IU] | ORAL_CAPSULE | ORAL | 0 refills | Status: DC

## 2018-07-23 NOTE — Progress Notes (Signed)
Office: [phone]  /  Fax: [phone]   HPI:   Chief Complaint: OBESITY [name] is here to discuss her progress with her obesity treatment plan. She is on the Category 3 plan and is following her eating plan approximately 85 % of the time. She states she is exercising 0 minutes 0 times per week. [name] did well with weight loss. She reports drinking more water. She has been getting bored with microwave meals and eating more sandwiches.   Her weight is 201 lb (91.2 kg) today and has had a weight loss of 2 pounds over a period of 2 weeks since her last visit. She has lost 19 lbs since starting treatment with [name].  Vitamin D deficiency [name] has a diagnosis of vitamin D deficiency. She is currently taking prescription Vit D and denies nausea, vomiting or muscle weakness.  Diabetes II [name] has a diagnosis of diabetes type II. [name] states BGs range between 111 and 128. [name] is taking metformin and denies any nausea, vomiting or diarrhea. She denies hypoglycemic or polyphagia episodes. Last A1c was Hemoglobin A1C Latest Ref Rng & Units 06/04/2018 01/19/2018 11/25/2017  HGBA1C 4.6 - 6.5 % 6.1 6.6(H) 6.2(A)  Some recent data might be hidden    She has been working on intensive lifestyle modifications including diet, exercise, and weight loss to help control her blood glucose levels.   ASSESSMENT AND PLAN:  Vitamin D deficiency - Plan: Vitamin D, Ergocalciferol, (DRISDOL) 1.25 MG (50000 UT) CAPS capsule  Type 2 diabetes mellitus without complication, without long-term current use of insulin (HCC)  Class 1 obesity with serious comorbidity and body mass index (BMI) of 33.0 to 33.9 in adult, unspecified obesity type  PLAN:  Vitamin D Deficiency [name] was informed that low vitamin D levels contributes to fatigue and are associated with obesity, breast, and colon cancer. She agrees to continue to take prescription Vit D @50 ,000 IU every week #4 with no  refills and will follow up for routine testing of vitamin D, at least 2-3 times per year. She was informed of the risk of over-replacement of vitamin D and agrees to not increase her dose unless she discusses this with [name] first. [name] agrees to follow up with our clinic in 2 weeks.  Diabetes II [name] has been given extensive diabetes education by myself today including ideal fasting and post-prandial blood glucose readings, individual ideal Hgb A1c goals  and hypoglycemia prevention. We discussed the importance of good blood sugar control to decrease the likelihood of diabetic complications such as nephropathy, neuropathy, limb loss, blindness, coronary artery disease, and death. We discussed the importance of intensive lifestyle modification including diet, exercise and weight loss as the first line treatment for diabetes. [name] agrees to continue with weight loss and taking her diabetes medications and will follow up with our clinic in 2 weeks.  Obesity [name] is currently in the action stage of change. As such, her goal is to continue with weight loss efforts She has agreed to follow the Category 3 plan [name] has been instructed to work up to a goal of 150 minutes of combined cardio and strengthening exercise per week for weight loss and overall health benefits. We discussed the following Behavioral Modification Strategies today: work on meal planning and easy cooking plans  [name] has agreed to follow up with our clinic in 2 weeks. She was informed of the importance of frequent follow up visits to maximize her success with intensive lifestyle modifications for her multiple health conditions.  ALLERGIES: Allergies  Allergen Reactions  . Augmentin [Amoxicillin-Pot Clavulanate] Rash    rash  . Erythromycin Nausea Only  . Penicillins Itching and Rash    Has patient had a PCN reaction causing immediate rash, facial/tongue/throat swelling, SOB or lightheadedness with  hypotension: No Has patient had a PCN reaction causing severe rash involving mucus membranes or skin necrosis: No Has patient had a PCN reaction that required hospitalization: No Has patient had a PCN reaction occurring within the last 10 years: No  If all of the above answers are "NO", then may proceed with Cephalosporin use.     MEDICATIONS: Current Outpatient Medications on File Prior to Visit  Medication Sig Dispense Refill  . aspirin EC 81 MG tablet Take 1 tablet (81 mg total) by mouth daily. 90 tablet 3  . atorvastatin (LIPITOR) 40 MG tablet Take 1 tablet (40 mg total) by mouth daily at 6 PM. 90 tablet 3  . Biotin 5000 MCG TABS Take 5,000 mcg by mouth daily.     . calcium-vitamin D (OSCAL WITH D) 500-200 MG-UNIT per tablet Take 2 tablets by mouth daily.     . clindamycin (CLEOCIN) 300 MG capsule TAKE 2 CAPSULES BY MOUTH 1 HOUR PRIOR TOO DENTAL CLEANING. 2 capsule 0  . clopidogrel (PLAVIX) 75 MG tablet Take 1 tablet (75 mg total) by mouth daily. 90 tablet 3  . fluticasone (FLONASE) 50 MCG/ACT nasal spray Place 2 sprays into both nostrils daily.     [name] glucose blood (ONETOUCH VERIO) test strip Use to test blood sugars daily. Dx: E11.9 100 each 0  . Insulin Pen Needle (BD PEN NEEDLE NANO 2ND GEN) 32G X 4 MM MISC 1 Package by Does not apply route 2 (two) times daily. 100 each 0  . Lancets (ONETOUCH ULTRASOFT) lancets Use to test blood sugars daily. Dx: E11.9 100 each 12  . loratadine (CLARITIN) 10 MG tablet Take 10 mg by mouth at bedtime.     . metFORMIN (GLUCOPHAGE) 500 MG tablet TAKE 1 TABLET WITH BREAKFAST AND 1/2 TO 1 TABLET WITH DINNER. 180 tablet 1  . metoCLOPramide (REGLAN) 10 MG tablet TAKE 1/2 TABLET BY MOUTH 4 TIMES A DAY 60 tablet 5  . metoprolol tartrate (LOPRESSOR) 25 MG tablet TAKE 1/2 TAB BY MOUTH TWICE A DAY 30 tablet 5  . Multiple Vitamin (MULTIVITAMIN WITH MINERALS) TABS tablet Take 1 tablet by mouth daily.    [name] omeprazole (PRILOSEC OTC) 20 MG tablet Take 40 mg by mouth  daily with breakfast.    . Probiotic Product (ALIGN) 4 MG CAPS Take 4 mg by mouth daily.     . psyllium (METAMUCIL SMOOTH TEXTURE) 28 % packet Take 1 packet by mouth daily before breakfast.     . venlafaxine XR (EFFEXOR-XR) 75 MG 24 hr capsule TAKE 1 CAPSULE (75 MG TOTAL) BY MOUTH DAILY WITH BREAKFAST. 90 capsule 2  . Vitamin D, Ergocalciferol, (DRISDOL) 1.25 MG (50000 UT) CAPS capsule Take 1 capsule (50,000 Units total) by mouth every 7 (seven) days. 4 capsule 0   No current facility-administered medications on file prior to visit.     PAST MEDICAL HISTORY: Past Medical History:  Diagnosis Date  . Allergy   . Anxiety   . Arthritis    back & knee  . Asthma     mild per pt shows up with resp illness  . Chronic diastolic congestive heart failure ([place])   . Colon polyps   . Constipation   . Diabetes mellitus without complication (  HCC)   . Dyspnea   . Gastric polyps   . Gastroparesis   . GERD (gastroesophageal reflux disease)   . Headache    sinus headaches and migraines at times  . Heart murmur   . History of migraine headaches   . HTN (hypertension)   . Hyperlipidemia   . IBS (irritable bowel syndrome)   . Joint pain   . Lumbar disc disease   . S/P aortic valve replacement with bioprosthetic valve 08/23/2016   25 mm Edwards Intuity rapid-deployment bovine pericardial tissue valve via partial upper mini sternotomy  . Sleep apnea   . TIA (transient ischemic attack)     PAST SURGICAL HISTORY: Past Surgical History:  Procedure Laterality Date  . ABDOMINAL HYSTERECTOMY    . AORTIC VALVE REPLACEMENT N/A 08/23/2016   Procedure: AORTIC VALVE REPLACEMENT (AVR) - using partial Upper Sternotomy- 22mm Edwards Intuity Aortic Valve used;  Surgeon: Rexene Alberts, MD;  Location: [place] [facility];  Service: Open Heart Surgery;  Laterality: N/A;  . BLADDER SUSPENSION  2007  . BREAST ENHANCEMENT SURGERY  1980  . CARPAL TUNNEL RELEASE Bilateral   . COLONOSCOPY    . DORSAL COMPARTMENT RELEASE Right  09/15/2014   Procedure: RIGHT WRIST DEQUERVAINS RELEASE ;  Surgeon: Kathryne Hitch, MD;  Location: [facility];  Service: Orthopedics;  Laterality: Right;  . ESOPHAGOGASTRODUODENOSCOPY    . KNEE ARTHROSCOPY  04/15/2012   Procedure: ARTHROSCOPY KNEE;  Surgeon: [name], MD;  Location: [facility];  Service: Orthopedics;  Laterality: Right;  . LAPAROSCOPIC CHOLECYSTECTOMY    . LASIK    . OOPHORECTOMY    . PALATE TO GINGIVA GRAFT  2017  . RIGHT/LEFT HEART CATH AND CORONARY ANGIOGRAPHY N/A 08/02/2016   Procedure: Right/Left Heart Cath and Coronary Angiography;  Surgeon: Sherren Mocha, MD;  Location: [facility];  Service: Cardiovascular;  Laterality: N/A;  . ROTATOR CUFF REPAIR Left   . TEE WITHOUT CARDIOVERSION N/A 08/23/2016   Procedure: TRANSESOPHAGEAL ECHOCARDIOGRAM (TEE);  Surgeon: Rexene Alberts, MD;  Location: [place];  Service: Open Heart Surgery;  Laterality: N/A;  . TOTAL KNEE ARTHROPLASTY  04/15/2012   Procedure: TOTAL KNEE ARTHROPLASTY;  Surgeon: [name], MD;  Location: [place] [facility];  Service: Orthopedics;  Laterality: Right;  . TRIGGER FINGER RELEASE Right 04/20/2015   Procedure: RIGHT TRIGGER FINGER RELEASE (TENDON SHEALTH INCISION) ;  Surgeon: [name], MD;  Location: [place] [facility];  Service: Orthopedics;  Laterality: Right;    SOCIAL HISTORY: Social History   Tobacco Use  . Smoking status: Never Smoker  . Smokeless tobacco: Never Used  Substance Use Topics  . Alcohol use: No  . Drug use: No    FAMILY HISTORY: Family History  Problem Relation Age of Onset  . COPD Mother   . Colon polyps Mother   . Irritable bowel syndrome Mother   . Anxiety disorder Mother   . Heart disease Father   . Alcohol abuse Father   . Colon polyps Maternal Aunt     ROS: Review of Systems  Constitutional: Positive for weight loss.  Gastrointestinal: Negative for diarrhea, nausea and vomiting.  Musculoskeletal:       Negative for muscle weakness    Endo/Heme/Allergies:       Negative for hypoglycemia     PHYSICAL EXAM: Blood pressure (!) 133/58, pulse (!) 50, temperature 98.1 F (36.7 C), temperature source Oral, height 5\' 5"  (1.651 m), weight 201 lb (91.2 kg), SpO2 99 %. Body  mass index is 33.45 kg/m. Physical Exam Vitals signs reviewed.  Constitutional:      Appearance: Normal appearance. She is obese.  Cardiovascular:     Rate and Rhythm: Normal rate.     Pulses: Normal pulses.  Pulmonary:     Effort: Pulmonary effort is normal.  Musculoskeletal: Normal range of motion.  Skin:    General: Skin is warm and dry.  Neurological:     Mental Status: She is alert and oriented to person, place, and time.  Psychiatric:        Mood and Affect: Mood normal.        Behavior: Behavior normal.     RECENT LABS AND TESTS: BMET    Component Value Date/Time   NA 131 (L) 06/04/2018 1012   NA 129 (L) 01/19/2018 0959   K 4.6 06/04/2018 1012   CL 97 06/04/2018 1012   CO2 27 06/04/2018 1012   GLUCOSE 109 (H) 06/04/2018 1012   BUN 18 06/04/2018 1012   BUN 16 01/19/2018 0959   CREATININE 0.61 06/04/2018 1012   CALCIUM 9.1 06/04/2018 1012   GFRNONAA 79 01/19/2018 0959   GFRAA 91 01/19/2018 0959   Lab Results  Component Value Date   HGBA1C 6.1 06/04/2018   HGBA1C 6.6 (H) 01/19/2018   HGBA1C 6.2 (A) 11/25/2017   HGBA1C 6.4 05/23/2017   HGBA1C 6.7 (H) 01/02/2017   Lab Results  Component Value Date   INSULIN 12.1 01/19/2018   CBC    Component Value Date/Time   WBC 8.8 06/04/2018 1012   RBC 4.51 06/04/2018 1012   HGB 11.8 (L) 06/04/2018 1012   HGB 11.7 01/19/2018 0959   HCT 36.3 06/04/2018 1012   HCT 35.7 01/19/2018 0959   PLT 346.0 06/04/2018 1012   PLT 358 04/24/2017 1231   MCV 80.5 06/04/2018 1012   MCV 81 01/19/2018 0959   MCH 26.5 (L) 01/19/2018 0959   MCH 27.4 08/26/2016 0248   MCHC 32.5 06/04/2018 1012   RDW 15.7 (H) 06/04/2018 1012   RDW 14.9 01/19/2018 0959   LYMPHSABS 2.9 01/19/2018 0959   MONOABS  0.6 05/11/2015 0836   EOSABS 0.1 01/19/2018 0959   BASOSABS 0.0 01/19/2018 0959   Iron/TIBC/Ferritin/ %Sat No results found for: IRON, TIBC, FERRITIN, IRONPCTSAT Lipid Panel     Component Value Date/Time   CHOL 116 06/04/2018 1012   CHOL 123 01/19/2018 0959   TRIG 70.0 06/04/2018 1012   HDL 47.40 06/04/2018 1012   HDL 54 01/19/2018 0959   CHOLHDL 2 06/04/2018 1012   VLDL 14.0 06/04/2018 1012   LDLCALC 54 06/04/2018 1012   LDLCALC 54 01/19/2018 0959   LDLDIRECT 58.0 11/25/2017 1156   Hepatic Function Panel     Component Value Date/Time   PROT 6.5 06/04/2018 1012   PROT 6.6 01/19/2018 0959   ALBUMIN 4.4 06/04/2018 1012   ALBUMIN 4.6 01/19/2018 0959   AST 22 06/04/2018 1012   ALT 15 06/04/2018 1012   ALKPHOS 151 (H) 06/04/2018 1012   BILITOT 0.4 06/04/2018 1012   BILITOT 0.5 01/19/2018 0959   BILIDIR 0.1 05/11/2015 0836      Component Value Date/Time   TSH 3.38 06/04/2018 1012   TSH 5.300 (H) 01/19/2018 0959   TSH 3.390 04/24/2017 1231     Ref. Range 01/19/2018 09:59  Vitamin D, 25-Hydroxy Latest Ref Range: 30.0 - 100.0 ng/mL 27.9 (L)     OBESITY BEHAVIORAL INTERVENTION VISIT  Today's visit was # 13   Starting weight: 220 lbs Starting  date: 01/19/2018 Today's weight :: 201 lbs Today's date: 07/23/2018 Total lbs lost to date: 19 At least 15 minutes were spent on discussing the following behavioral intervention visit.   ASK: We discussed the diagnosis of obesity with [name] today and [name] agreed to give [name] permission to discuss obesity behavioral modification therapy today.  ASSESS: [name] has the diagnosis of obesity and her BMI today is 33.45 [name] is in the action stage of change   ADVISE: [name] was educated on the multiple health risks of obesity as well as the benefit of weight loss to improve her health. She was advised of the need for long term treatment and the importance of lifestyle modifications to improve her  current health and to decrease her risk of future health problems.  AGREE: Multiple dietary modification options and treatment options were discussed and  [name] agreed to follow the recommendations documented in the above note.  ARRANGE: [name] was educated on the importance of frequent visits to treat obesity as outlined per CMS and USPSTF guidelines and agreed to schedule her next follow up appointment today.  I, Tammy Wysor, am acting as Location manager for [employer] I, [name], PA-C have reviewed above note and agree with its content

## 2018-07-26 ENCOUNTER — Other Ambulatory Visit: Payer: Self-pay | Admitting: Family Medicine

## 2018-08-07 ENCOUNTER — Encounter: Payer: Self-pay | Admitting: Family Medicine

## 2018-08-07 NOTE — Telephone Encounter (Signed)
Please advise 

## 2018-08-11 ENCOUNTER — Ambulatory Visit (INDEPENDENT_AMBULATORY_CARE_PROVIDER_SITE_OTHER): Payer: Medicare Other | Admitting: Physician Assistant

## 2018-08-11 ENCOUNTER — Encounter (INDEPENDENT_AMBULATORY_CARE_PROVIDER_SITE_OTHER): Payer: Self-pay | Admitting: Physician Assistant

## 2018-08-11 VITALS — BP 152/72 | HR 56 | Temp 98.5°F | Ht 65.0 in | Wt 204.0 lb

## 2018-08-11 DIAGNOSIS — E559 Vitamin D deficiency, unspecified: Secondary | ICD-10-CM

## 2018-08-11 DIAGNOSIS — Z6834 Body mass index (BMI) 34.0-34.9, adult: Secondary | ICD-10-CM | POA: Diagnosis not present

## 2018-08-11 DIAGNOSIS — E669 Obesity, unspecified: Secondary | ICD-10-CM

## 2018-08-11 DIAGNOSIS — E119 Type 2 diabetes mellitus without complications: Secondary | ICD-10-CM

## 2018-08-11 MED ORDER — VITAMIN D (ERGOCALCIFEROL) 1.25 MG (50000 UNIT) PO CAPS: 50000.0000 [IU] | ORAL_CAPSULE | ORAL | 0 refills | Status: DC

## 2018-08-11 NOTE — Progress Notes (Signed)
Office: [phone]  /  Fax: [phone]   HPI:   Chief Complaint: OBESITY Syretta is here to discuss her progress with her obesity treatment plan. She is on the Category 3 plan and is following her eating plan approximately 90% of the time. She states she is exercising 0 minutes 0 times per week. Tempestt reports that she has been sick with a sinus infection over the last week and her appetite is decreased. Her weight is 204 lb (92.5 kg) today and has had a weight gain of 3 pounds since her last visit. She has lost 16 lbs since starting treatment with [name].  Vitamin D deficiency Reyana has a diagnosis of Vitamin D deficiency. She is currently taking prescription Vit D and denies nausea, vomiting or muscle weakness.  Diabetes II Kiauna has a diagnosis of diabetes type II. Keerstin states fasting blood sugars range between 116 and 131. She denies any polyphagia or hypoglycemic episodes. Last A1c was 6.1 on 06/04/2018. She has been working on intensive lifestyle modifications including diet, exercise, and weight loss to help control her blood glucose levels.   ASSESSMENT AND PLAN:  Vitamin D deficiency - Plan: Vitamin D, Ergocalciferol, (DRISDOL) 1.25 MG (50000 UT) CAPS capsule  Type 2 diabetes mellitus without complication, without long-term current use of insulin (HCC)  Class 1 obesity with serious comorbidity and body mass index (BMI) of 34.0 to 34.9 in adult, unspecified obesity type  PLAN:  Vitamin D Deficiency Elaina was informed that low Vitamin D levels contributes to fatigue and are associated with obesity, breast, and colon cancer. She agrees to continue to take prescription Vit D @ 50,000 IU every week #4 with no refills and will follow-up for routine testing of Vitamin D, at least 2-3 times per year. She was informed of the risk of over-replacement of Vitamin D and agrees to not increase her dose unless she discusses this with [name] first. Suprena agrees to  follow-up with our clinic in 2-3 weeks.  Diabetes II Monik has been given extensive diabetes education by myself today including ideal fasting and post-prandial blood glucose readings, individual ideal HgA1c goals  and hypoglycemia prevention. We discussed the importance of good blood sugar control to decrease the likelihood of diabetic complications such as nephropathy, neuropathy, limb loss, blindness, coronary artery disease, and death. We discussed the importance of intensive lifestyle modification including diet, exercise and weight loss as the first line treatment for diabetes. Asa agrees to continue her diabetes medications and will follow-up at the agreed upon time.  Obesity Iriana is currently in the action stage of change. As such, her goal is to continue with weight loss efforts. She has agreed to follow the Category 3 plan. Krishawna has been instructed to work up to a goal of 150 minutes of combined cardio and strengthening exercise per week for weight loss and overall health benefits. We discussed the following Behavioral Modification Strategies today: increasing lean protein intake and work on meal planning and easy cooking plans.  Bracha has agreed to follow up with our clinic in 2-3 weeks. She was informed of the importance of frequent follow up visits to maximize her success with intensive lifestyle modifications for her multiple health conditions.  ALLERGIES: Allergies  Allergen Reactions  . Augmentin [Amoxicillin-Pot Clavulanate] Rash    rash  . Erythromycin Nausea Only  . Penicillins Itching and Rash    Has patient had a PCN reaction causing immediate rash, facial/tongue/throat swelling, SOB or lightheadedness with hypotension: No Has patient had  a PCN reaction causing severe rash involving mucus membranes or skin necrosis: No Has patient had a PCN reaction that required hospitalization: No Has patient had a PCN reaction occurring within the last 10  years: No  If all of the above answers are "NO", then may proceed with Cephalosporin use.     MEDICATIONS: Current Outpatient Medications on File Prior to Visit  Medication Sig Dispense Refill  . aspirin EC 81 MG tablet Take 1 tablet (81 mg total) by mouth daily. 90 tablet 3  . atorvastatin (LIPITOR) 40 MG tablet TAKE 1 TABLET (40 MG TOTAL) BY MOUTH DAILY AT 6 PM. 90 tablet 3  . Biotin 5000 MCG TABS Take 5,000 mcg by mouth daily.     . calcium-vitamin D (OSCAL WITH D) 500-200 MG-UNIT per tablet Take 2 tablets by mouth daily.     . clindamycin (CLEOCIN) 300 MG capsule TAKE 2 CAPSULES BY MOUTH 1 HOUR PRIOR TOO DENTAL CLEANING. 2 capsule 0  . clopidogrel (PLAVIX) 75 MG tablet TAKE 1 TABLET BY MOUTH EVERY DAY 90 tablet 3  . fluticasone (FLONASE) 50 MCG/ACT nasal spray Place 2 sprays into both nostrils daily.     [name] glucose blood (ONETOUCH VERIO) test strip Use to test blood sugars daily. Dx: E11.9 100 each 0  . Insulin Pen Needle (BD PEN NEEDLE NANO 2ND GEN) 32G X 4 MM MISC 1 Package by Does not apply route 2 (two) times daily. 100 each 0  . Lancets (ONETOUCH ULTRASOFT) lancets Use to test blood sugars daily. Dx: E11.9 100 each 12  . loratadine (CLARITIN) 10 MG tablet Take 10 mg by mouth at bedtime.     . metFORMIN (GLUCOPHAGE) 500 MG tablet TAKE 1 TABLET WITH BREAKFAST AND 1/2 TO 1 TABLET WITH DINNER. 180 tablet 1  . metoCLOPramide (REGLAN) 10 MG tablet TAKE 1/2 TABLET BY MOUTH 4 TIMES A DAY 60 tablet 5  . metoprolol tartrate (LOPRESSOR) 25 MG tablet TAKE 1/2 TAB BY MOUTH TWICE A DAY 30 tablet 5  . Multiple Vitamin (MULTIVITAMIN WITH MINERALS) TABS tablet Take 1 tablet by mouth daily.    [name] omeprazole (PRILOSEC OTC) 20 MG tablet Take 40 mg by mouth daily with breakfast.    . Probiotic Product (ALIGN) 4 MG CAPS Take 4 mg by mouth daily.     . psyllium (METAMUCIL SMOOTH TEXTURE) 28 % packet Take 1 packet by mouth daily before breakfast.     . venlafaxine XR (EFFEXOR-XR) 75 MG 24 hr capsule TAKE 1  CAPSULE (75 MG TOTAL) BY MOUTH DAILY WITH BREAKFAST. 90 capsule 2   No current facility-administered medications on file prior to visit.     PAST MEDICAL HISTORY: Past Medical History:  Diagnosis Date  . Allergy   . Anxiety   . Arthritis    back & knee  . Asthma     mild per pt shows up with resp illness  . Chronic diastolic congestive heart failure ([place])   . Colon polyps   . Constipation   . Diabetes mellitus without complication ([place])   . Dyspnea   . Gastric polyps   . Gastroparesis   . GERD (gastroesophageal reflux disease)   . Headache    sinus headaches and migraines at times  . Heart murmur   . History of migraine headaches   . HTN (hypertension)   . Hyperlipidemia   . IBS (irritable bowel syndrome)   . Joint pain   . Lumbar disc disease   . S/P aortic valve  replacement with bioprosthetic valve 08/23/2016   25 mm Edwards Intuity rapid-deployment bovine pericardial tissue valve via partial upper mini sternotomy  . Sleep apnea   . TIA (transient ischemic attack)     PAST SURGICAL HISTORY: Past Surgical History:  Procedure Laterality Date  . ABDOMINAL HYSTERECTOMY    . AORTIC VALVE REPLACEMENT N/A 08/23/2016   Procedure: AORTIC VALVE REPLACEMENT (AVR) - using partial Upper Sternotomy- 34mm Edwards Intuity Aortic Valve used;  Surgeon: Rexene Alberts, MD;  Location: [facility];  Service: Open Heart Surgery;  Laterality: N/A;  . BLADDER SUSPENSION  2007  . BREAST ENHANCEMENT SURGERY  1980  . CARPAL TUNNEL RELEASE Bilateral   . COLONOSCOPY    . DORSAL COMPARTMENT RELEASE Right 09/15/2014   Procedure: RIGHT WRIST DEQUERVAINS RELEASE ;  Surgeon: Kathryne Hitch, MD;  Location: [facility];  Service: Orthopedics;  Laterality: Right;  . ESOPHAGOGASTRODUODENOSCOPY    . KNEE ARTHROSCOPY  04/15/2012   Procedure: ARTHROSCOPY KNEE;  Surgeon: [name], MD;  Location: [facility];  Service: Orthopedics;  Laterality: Right;  . LAPAROSCOPIC CHOLECYSTECTOMY    . LASIK    .  OOPHORECTOMY    . PALATE TO GINGIVA GRAFT  2017  . RIGHT/LEFT HEART CATH AND CORONARY ANGIOGRAPHY N/A 08/02/2016   Procedure: Right/Left Heart Cath and Coronary Angiography;  Surgeon: Sherren Mocha, MD;  Location: [facility];  Service: Cardiovascular;  Laterality: N/A;  . ROTATOR CUFF REPAIR Left   . TEE WITHOUT CARDIOVERSION N/A 08/23/2016   Procedure: TRANSESOPHAGEAL ECHOCARDIOGRAM (TEE);  Surgeon: Rexene Alberts, MD;  Location: [facility];  Service: Open Heart Surgery;  Laterality: N/A;  . TOTAL KNEE ARTHROPLASTY  04/15/2012   Procedure: TOTAL KNEE ARTHROPLASTY;  Surgeon: [name], MD;  Location: [facility];  Service: Orthopedics;  Laterality: Right;  . TRIGGER FINGER RELEASE Right 04/20/2015   Procedure: RIGHT TRIGGER FINGER RELEASE (TENDON SHEALTH INCISION) ;  Surgeon: [name], MD;  Location: [facility];  Service: Orthopedics;  Laterality: Right;    SOCIAL HISTORY: Social History   Tobacco Use  . Smoking status: Never Smoker  . Smokeless tobacco: Never Used  Substance Use Topics  . Alcohol use: No  . Drug use: No    FAMILY HISTORY: Family History  Problem Relation Age of Onset  . COPD Mother   . Colon polyps Mother   . Irritable bowel syndrome Mother   . Anxiety disorder Mother   . Heart disease Father   . Alcohol abuse Father   . Colon polyps Maternal Aunt    ROS: Review of Systems  Constitutional: Negative for weight loss.  Gastrointestinal: Negative for nausea and vomiting.  Musculoskeletal:       Negative for muscle weakness.  Endo/Heme/Allergies:       Negative for polyphagia. Negative for hypoglycemia.   PHYSICAL EXAM: Blood pressure (!) 152/72, pulse (!) 56, temperature 98.5 F (36.9 C), temperature source Oral, height 5\' 5"  (1.651 m), weight 204 lb (92.5 kg), SpO2 100 %. Body mass index is 33.95 kg/m. Physical Exam Vitals signs reviewed.  Constitutional:      Appearance: Normal appearance. She is obese.  Cardiovascular:      Rate and Rhythm: Normal rate.     Pulses: Normal pulses.  Pulmonary:     Effort: Pulmonary effort is normal.     Breath sounds: Normal breath sounds.  Musculoskeletal: Normal range of motion.  Skin:    General: Skin is warm and dry.  Neurological:  Mental Status: She is alert and oriented to person, place, and time.  Psychiatric:        Behavior: Behavior normal.   RECENT LABS AND TESTS: BMET    Component Value Date/Time   NA 131 (L) 06/04/2018 1012   NA 129 (L) 01/19/2018 0959   K 4.6 06/04/2018 1012   CL 97 06/04/2018 1012   CO2 27 06/04/2018 1012   GLUCOSE 109 (H) 06/04/2018 1012   BUN 18 06/04/2018 1012   BUN 16 01/19/2018 0959   CREATININE 0.61 06/04/2018 1012   CALCIUM 9.1 06/04/2018 1012   GFRNONAA 79 01/19/2018 0959   GFRAA 91 01/19/2018 0959   Lab Results  Component Value Date   HGBA1C 6.1 06/04/2018   HGBA1C 6.6 (H) 01/19/2018   HGBA1C 6.2 (A) 11/25/2017   HGBA1C 6.4 05/23/2017   HGBA1C 6.7 (H) 01/02/2017   Lab Results  Component Value Date   INSULIN 12.1 01/19/2018   CBC    Component Value Date/Time   WBC 8.8 06/04/2018 1012   RBC 4.51 06/04/2018 1012   HGB 11.8 (L) 06/04/2018 1012   HGB 11.7 01/19/2018 0959   HCT 36.3 06/04/2018 1012   HCT 35.7 01/19/2018 0959   PLT 346.0 06/04/2018 1012   PLT 358 04/24/2017 1231   MCV 80.5 06/04/2018 1012   MCV 81 01/19/2018 0959   MCH 26.5 (L) 01/19/2018 0959   MCH 27.4 08/26/2016 0248   MCHC 32.5 06/04/2018 1012   RDW 15.7 (H) 06/04/2018 1012   RDW 14.9 01/19/2018 0959   LYMPHSABS 2.9 01/19/2018 0959   MONOABS 0.6 05/11/2015 0836   EOSABS 0.1 01/19/2018 0959   BASOSABS 0.0 01/19/2018 0959   Iron/TIBC/Ferritin/ %Sat No results found for: IRON, TIBC, FERRITIN, IRONPCTSAT Lipid Panel     Component Value Date/Time   CHOL 116 06/04/2018 1012   CHOL 123 01/19/2018 0959   TRIG 70.0 06/04/2018 1012   HDL 47.40 06/04/2018 1012   HDL 54 01/19/2018 0959   CHOLHDL 2 06/04/2018 1012   VLDL 14.0  06/04/2018 1012   LDLCALC 54 06/04/2018 1012   LDLCALC 54 01/19/2018 0959   LDLDIRECT 58.0 11/25/2017 1156   Hepatic Function Panel     Component Value Date/Time   PROT 6.5 06/04/2018 1012   PROT 6.6 01/19/2018 0959   ALBUMIN 4.4 06/04/2018 1012   ALBUMIN 4.6 01/19/2018 0959   AST 22 06/04/2018 1012   ALT 15 06/04/2018 1012   ALKPHOS 151 (H) 06/04/2018 1012   BILITOT 0.4 06/04/2018 1012   BILITOT 0.5 01/19/2018 0959   BILIDIR 0.1 05/11/2015 0836      Component Value Date/Time   TSH 3.38 06/04/2018 1012   TSH 5.300 (H) 01/19/2018 0959   TSH 3.390 04/24/2017 1231     Ref. Range 01/19/2018 09:59  Vitamin D, 25-Hydroxy Latest Ref Range: 30.0 - 100.0 ng/mL 27.9 (L)   OBESITY BEHAVIORAL INTERVENTION VISIT  Today's visit was #14  Starting weight: 220 lbs Starting date: 01/19/2018 Today's weight: 204 lbs Today's date: 08/11/2018 Total lbs lost to date: 16 At least 15 minutes were spent on discussing the following behavioral intervention visit.  ASK: We discussed the diagnosis of obesity with Suheily P Lippert today and Shaniya agreed to give [name] permission to discuss obesity behavioral modification therapy today.  ASSESS: Kerina has the diagnosis of obesity and her BMI today is 33.95. Chayil is in the action stage of change.   ADVISE: Evaleigh was educated on the multiple health risks of obesity as well as  the benefit of weight loss to improve her health. She was advised of the need for long term treatment and the importance of lifestyle modifications to improve her current health and to decrease her risk of future health problems.  AGREE: Multiple dietary modification options and treatment options were discussed and  Giovannina agreed to follow the recommendations documented in the above note.  ARRANGE: Anjani was educated on the importance of frequent visits to treat obesity as outlined per CMS and USPSTF guidelines and agreed to schedule her next follow  up appointment today.  Migdalia Dk, am acting as transcriptionist for [name], PA-C I, [name], PA-C have reviewed above note and agree with its content

## 2018-08-12 ENCOUNTER — Encounter: Payer: Self-pay | Admitting: Family Medicine

## 2018-08-12 ENCOUNTER — Ambulatory Visit: Payer: Medicare Other | Admitting: Family Medicine

## 2018-08-12 VITALS — BP 138/62 | HR 51 | Temp 97.5°F | Ht 65.0 in | Wt 209.8 lb

## 2018-08-12 DIAGNOSIS — J0100 Acute maxillary sinusitis, unspecified: Secondary | ICD-10-CM

## 2018-08-12 MED ORDER — CEFDINIR 300 MG PO CAPS: 300.0000 mg | ORAL_CAPSULE | Freq: Two times a day (BID) | ORAL | 0 refills | Status: DC

## 2018-08-12 NOTE — Patient Instructions (Signed)
10 day course of antibiotic. Using cefdinir. Take twice a day. Let me know if any itching.  Continue flonase Cool mist humidifier. = Have fun in [place]!   Sinusitis, Adult Sinusitis is soreness and swelling (inflammation) of your sinuses. Sinuses are hollow spaces in the bones around your face. They are located:  Around your eyes.  In the middle of your forehead.  Behind your nose.  In your cheekbones. Your sinuses and nasal passages are lined with a fluid called mucus. Mucus drains out of your sinuses. Swelling can trap mucus in your sinuses. This lets germs (bacteria, virus, or fungus) grow, which leads to infection. Most of the time, this condition is caused by a virus. What are the causes? This condition is caused by:  Allergies.  Asthma.  Germs.  Things that block your nose or sinuses.  Growths in the nose (nasal polyps).  Chemicals or irritants in the air.  Fungus (rare). What increases the risk? You are more likely to develop this condition if:  You have a weak body defense system (immune system).  You do a lot of swimming or diving.  You use nasal sprays too much.  You smoke. What are the signs or symptoms? The main symptoms of this condition are pain and a feeling of pressure around the sinuses. Other symptoms include:  Stuffy nose (congestion).  Runny nose (drainage).  Swelling and warmth in the sinuses.  Headache.  Toothache.  A cough that may get worse at night.  Mucus that collects in the throat or the back of the nose (postnasal drip).  Being unable to smell and taste.  Being very tired (fatigue).  A fever.  Sore throat.  Bad breath. How is this diagnosed? This condition is diagnosed based on:  Your symptoms.  Your medical history.  A physical exam.  Tests to find out if your condition is short-term (acute) or long-term (chronic). Your doctor may: ? Check your nose for growths (polyps). ? Check your sinuses using a tool  that has a light (endoscope). ? Check for allergies or germs. ? Do imaging tests, such as an MRI or CT scan. How is this treated? Treatment for this condition depends on the cause and whether it is short-term or long-term.  If caused by a virus, your symptoms should go away on their own within 10 days. You may be given medicines to relieve symptoms. They include: ? Medicines that shrink swollen tissue in the nose. ? Medicines that treat allergies (antihistamines). ? A spray that treats swelling of the nostrils. ? Rinses that help get rid of thick mucus in your nose (nasal saline washes).  If caused by bacteria, your doctor may wait to see if you will get better without treatment. You may be given antibiotic medicine if you have: ? A very bad infection. ? A weak body defense system.  If caused by growths in the nose, you may need to have surgery. Follow these instructions at home: Medicines  Take, use, or apply over-the-counter and prescription medicines only as told by your doctor. These may include nasal sprays.  If you were prescribed an antibiotic medicine, take it as told by your doctor. Do not stop taking the antibiotic even if you start to feel better. Hydrate and humidify   Drink enough water to keep your pee (urine) pale yellow.  Use a cool mist humidifier to keep the humidity level in your home above 50%.  Breathe in steam for 10-15 minutes, 3-4 times a day,  or as told by your doctor. You can do this in the bathroom while a hot shower is running.  Try not to spend time in cool or dry air. Rest  Rest as much as you can.  Sleep with your head raised (elevated).  Make sure you get enough sleep each night. General instructions   Put a warm, moist washcloth on your face 3-4 times a day, or as often as told by your doctor. This will help with discomfort.  Wash your hands often with soap and water. If there is no soap and water, use hand sanitizer.  Do not smoke.  Avoid being around people who are smoking (secondhand smoke).  Keep all follow-up visits as told by your doctor. This is important. Contact a doctor if:  You have a fever.  Your symptoms get worse.  Your symptoms do not get better within 10 days. Get help right away if:  You have a very bad headache.  You cannot stop throwing up (vomiting).  You have very bad pain or swelling around your face or eyes.  You have trouble seeing.  You feel confused.  Your neck is stiff.  You have trouble breathing. Summary  Sinusitis is swelling of your sinuses. Sinuses are hollow spaces in the bones around your face.  This condition is caused by tissues in your nose that become inflamed or swollen. This traps germs. These can lead to infection.  If you were prescribed an antibiotic medicine, take it as told by your doctor. Do not stop taking it even if you start to feel better.  Keep all follow-up visits as told by your doctor. This is important. This information is not intended to replace advice given to you by your health care provider. Make sure you discuss any questions you have with your health care provider. Document Released: 11/27/2007 Document Revised: 11/10/2017 Document Reviewed: 11/10/2017 Elsevier Interactive Patient Education  2019 [employer].

## 2018-08-12 NOTE — Progress Notes (Signed)
Patient: [name] MRN: [id number] DOB: 12-13-1950 PCP: [name], MD     Subjective:  Chief Complaint  Patient presents with  . Sinusitis    HPI: The patient is a 68 y.o. female who presents today for sinus complaints. She states she had symptoms x 10 days. She has a lot of sinus pain and pressure and her head is swollen. She has headaches that are relieved only at night when she is sleeping. She is going to [place] and wants to make sure she is okay. She has been using flonase, saline and another px. She also gets allergy shots. She does get sinus infections and this feels like one. Her headache feels like a sinus headache. She has also blown blood. No fever/chills.   Review of Systems  Constitutional: Positive for fatigue. Negative for chills and fever.  HENT: Positive for congestion, postnasal drip, rhinorrhea, sinus pressure and sinus pain. Negative for ear pain and sore throat.   Respiratory: Positive for cough. Negative for shortness of breath.   Cardiovascular: Negative for chest pain.  Musculoskeletal: Negative for neck pain and neck stiffness.  Neurological: Positive for headaches. Negative for dizziness.    Allergies Patient is allergic to augmentin [amoxicillin-pot clavulanate]; erythromycin; and penicillins.  Past Medical History Patient  has a past medical history of Allergy, Anxiety, Arthritis, Asthma, Chronic diastolic congestive heart failure ([place]), Colon polyps, Constipation, Diabetes mellitus without complication ([place]), Dyspnea, Gastric polyps, Gastroparesis, GERD (gastroesophageal reflux disease), Headache, Heart murmur, History of migraine headaches, HTN (hypertension), Hyperlipidemia, IBS (irritable bowel syndrome), Joint pain, Lumbar disc disease, S/P aortic valve replacement with bioprosthetic valve (08/23/2016), Sleep apnea, and TIA (transient ischemic attack).  Surgical History Patient  has a past surgical history that includes Abdominal  hysterectomy; Oophorectomy; Carpal tunnel release (Bilateral); Esophagogastroduodenoscopy; Rotator cuff repair (Left); LASIK; Breast enhancement surgery (1980); Laparoscopic cholecystectomy; Bladder suspension (2007); Knee arthroscopy (04/15/2012); Total knee arthroplasty (04/15/2012); Dorsal compartment release (Right, 09/15/2014); Trigger finger release (Right, 04/20/2015); Palate to gingiva graft (2017); RIGHT/LEFT HEART CATH AND CORONARY ANGIOGRAPHY (N/A, 08/02/2016); Colonoscopy; TEE without cardioversion (N/A, 08/23/2016); and Aortic valve replacement (N/A, 08/23/2016).  Family History Pateint's family history includes Alcohol abuse in her father; Anxiety disorder in her mother; COPD in her mother; Colon polyps in her maternal aunt and mother; Heart disease in her father; Irritable bowel syndrome in her mother.  Social History Patient  reports that she has never smoked. She has never used smokeless tobacco. She reports that she does not drink alcohol or use drugs.    Objective: Vitals:   08/12/18 0834  BP: 138/62  Pulse: (!) 51  Temp: (!) 97.5 F (36.4 C)  TempSrc: Oral  SpO2: 100%  Weight: 209 lb 12.8 oz (95.2 kg)  Height: 5\' 5"  (1.651 m)    Body mass index is 34.91 kg/m.  Physical Exam Vitals signs reviewed.  Constitutional:      Appearance: She is obese.  HENT:     Head: Normocephalic and atraumatic.     Right Ear: Tympanic membrane, ear canal and external ear normal.     Left Ear: Tympanic membrane, ear canal and external ear normal.     Nose: Nose normal.     Comments: Quite TTP over frontal sinuses. Mildly over maxillary sinuses     Mouth/Throat:     Mouth: Mucous membranes are moist.     Pharynx: No oropharyngeal exudate or posterior oropharyngeal erythema.  Neck:     Musculoskeletal: Normal range of motion and neck supple.  Cardiovascular:     Rate and Rhythm: Normal rate and regular rhythm.     Heart sounds: Normal heart sounds.  Pulmonary:     Effort: Pulmonary  effort is normal. No respiratory distress.     Breath sounds: Normal breath sounds. No wheezing or rales.  Abdominal:     General: Abdomen is flat. Bowel sounds are normal.     Palpations: Abdomen is soft.  Lymphadenopathy:     Cervical: No cervical adenopathy.  Skin:    Capillary Refill: Capillary refill takes less than 2 seconds.  Neurological:     General: No focal deficit present.     Mental Status: She is alert and oriented to person, place, and time.  Psychiatric:        Mood and Affect: Mood normal.        Behavior: Behavior normal.        Assessment/plan: 1. Acute non-recurrent maxillary sinusitis >10 day history of symptoms. Will treat with omnicef. Allergy to pcn is rash, no anaphylaxis. she is refusing doxycycline, but did discuss this is first line in pcn allergy patients. Continue flonase. Also recommended cool mist humidifier. Let us know if not getting better. F/u as needed.     Return if symptoms worsen or fail to improve.    Orma Flaming, MD [place]   08/12/2018

## 2018-08-18 ENCOUNTER — Encounter: Payer: Self-pay | Admitting: Family Medicine

## 2018-08-18 DIAGNOSIS — E119 Type 2 diabetes mellitus without complications: Secondary | ICD-10-CM

## 2018-08-19 MED ORDER — GLUCOSE BLOOD VI STRP: ORAL_STRIP | 0 refills | Status: DC

## 2018-08-25 ENCOUNTER — Ambulatory Visit (INDEPENDENT_AMBULATORY_CARE_PROVIDER_SITE_OTHER): Payer: Medicare Other | Admitting: Physician Assistant

## 2018-09-07 ENCOUNTER — Encounter (INDEPENDENT_AMBULATORY_CARE_PROVIDER_SITE_OTHER): Payer: Self-pay

## 2018-09-10 ENCOUNTER — Other Ambulatory Visit: Payer: Self-pay

## 2018-09-10 ENCOUNTER — Encounter (INDEPENDENT_AMBULATORY_CARE_PROVIDER_SITE_OTHER): Payer: Self-pay | Admitting: Physician Assistant

## 2018-09-10 ENCOUNTER — Ambulatory Visit (INDEPENDENT_AMBULATORY_CARE_PROVIDER_SITE_OTHER): Payer: Medicare Other | Admitting: Physician Assistant

## 2018-09-10 VITALS — BP 154/67 | HR 51 | Temp 98.1°F | Ht 65.0 in | Wt 202.0 lb

## 2018-09-10 DIAGNOSIS — E119 Type 2 diabetes mellitus without complications: Secondary | ICD-10-CM

## 2018-09-10 DIAGNOSIS — Z6833 Body mass index (BMI) 33.0-33.9, adult: Secondary | ICD-10-CM | POA: Diagnosis not present

## 2018-09-10 DIAGNOSIS — E559 Vitamin D deficiency, unspecified: Secondary | ICD-10-CM

## 2018-09-10 DIAGNOSIS — Z9189 Other specified personal risk factors, not elsewhere classified: Secondary | ICD-10-CM | POA: Diagnosis not present

## 2018-09-10 DIAGNOSIS — E669 Obesity, unspecified: Secondary | ICD-10-CM | POA: Diagnosis not present

## 2018-09-10 MED ORDER — VITAMIN D (ERGOCALCIFEROL) 1.25 MG (50000 UNIT) PO CAPS: 50000.0000 [IU] | ORAL_CAPSULE | ORAL | 0 refills | Status: DC

## 2018-09-10 NOTE — Progress Notes (Signed)
Office: [phone]  /  Fax: [phone]   HPI:   Chief Complaint: OBESITY [name] is here to discuss her progress with her obesity treatment plan. She is on the Category 3 plan and is following her eating plan approximately 95% of the time. She states she is walking 9 miles 7 times per week. [name] did well with weight loss. She reports eating close to the plan on most days.  Her weight is 202 lb (91.6 kg) today and has had a weight loss of 2 pounds over a period of 4 weeks since her last visit. She has lost 18 lbs since starting treatment with [name].  Vitamin D deficiency [name] has a diagnosis of Vitamin D deficiency. She is currently taking prescription Vit D and denies nausea, vomiting or muscle weakness.  At risk for osteopenia and osteoporosis [name] is at higher risk of osteopenia and osteoporosis due to Vitamin D deficiency.   Diabetes II [name] has a diagnosis of diabetes type II and is on metformin. [name] states fasting blood sugars range between 101 and 138 and denies any hypoglycemic episodes. Last A1c was 6.1 on 06/04/2018. She has been working on intensive lifestyle modifications including diet, exercise, and weight loss to help control her blood glucose levels. She denies nausea, vomiting, or diarrhea.  ASSESSMENT AND PLAN:  Vitamin D deficiency - Plan: Vitamin D, Ergocalciferol, (DRISDOL) 1.25 MG (50000 UT) CAPS capsule  Type 2 diabetes mellitus without complication, without long-term current use of insulin (HCC)  At risk for osteoporosis  Class 1 obesity with serious comorbidity and body mass index (BMI) of 33.0 to 33.9 in adult, unspecified obesity type  PLAN:  Vitamin D Deficiency [name] was informed that low Vitamin D levels contributes to fatigue and are associated with obesity, breast, and colon cancer. She agrees to continue to take prescription Vit D @ 50,000 IU every week #4 with 0 refills and will follow-up for routine testing  of Vitamin D, at least 2-3 times per year. She was informed of the risk of over-replacement of Vitamin D and agrees to not increase her dose unless she discusses this with [name] first. [name] agrees to follow-up with our clinic in 2-3 weeks.  At risk for osteopenia and osteoporosis [name] was given extended  (15 minutes) osteoporosis prevention counseling today. [name] is at risk for osteopenia and osteoporsis due to her Vitamin D deficiency. She was encouraged to take her Vitamin D and follow her higher calcium diet and increase strengthening exercise to help strengthen her bones and decrease her risk of osteopenia and osteoporosis.  Diabetes II [name] has been given extensive diabetes education by myself today including ideal fasting and post-prandial blood glucose readings, individual ideal Hgb A1c goals  and hypoglycemia prevention. We discussed the importance of good blood sugar control to decrease the likelihood of diabetic complications such as nephropathy, neuropathy, limb loss, blindness, coronary artery disease, and death. We discussed the importance of intensive lifestyle modification including diet, exercise and weight loss as the first line treatment for diabetes. Emmanuel agrees to continue her metformin and will follow-up at the agreed upon time.  Obesity [name] is currently in the action stage of change. As such, her goal is to continue with weight loss efforts. She has agreed to follow the Category 3 plan. [name] has been instructed to work up to a goal of 150 minutes of combined cardio and strengthening exercise per week for weight loss and overall health benefits. We discussed the following Behavioral Modification Strategies today:  work on [employer], easy cooking plans, and planning for success.  [name] has agreed to follow-up with our clinic in 2-3 weeks. She was informed of the importance of frequent follow-up visits to maximize her success with  intensive lifestyle modifications for her multiple health conditions.  ALLERGIES: Allergies  Allergen Reactions  . Augmentin [Amoxicillin-Pot Clavulanate] Rash    rash  . Erythromycin Nausea Only  . Penicillins Itching and Rash    Has patient had a PCN reaction causing immediate rash, facial/tongue/throat swelling, SOB or lightheadedness with hypotension: No Has patient had a PCN reaction causing severe rash involving mucus membranes or skin necrosis: No Has patient had a PCN reaction that required hospitalization: No Has patient had a PCN reaction occurring within the last 10 years: No  If all of the above answers are "NO", then may proceed with Cephalosporin use.     MEDICATIONS: Current Outpatient Medications on File Prior to Visit  Medication Sig Dispense Refill  . aspirin EC 81 MG tablet Take 1 tablet (81 mg total) by mouth daily. 90 tablet 3  . atorvastatin (LIPITOR) 40 MG tablet TAKE 1 TABLET (40 MG TOTAL) BY MOUTH DAILY AT 6 PM. 90 tablet 3  . Biotin 5000 MCG TABS Take 5,000 mcg by mouth daily.     . calcium-vitamin D (OSCAL WITH D) 500-200 MG-UNIT per tablet Take 2 tablets by mouth daily.     . cefdinir (OMNICEF) 300 MG capsule Take 1 capsule (300 mg total) by mouth 2 (two) times daily. 20 capsule 0  . clindamycin (CLEOCIN) 300 MG capsule TAKE 2 CAPSULES BY MOUTH 1 HOUR PRIOR TOO DENTAL CLEANING. 2 capsule 0  . clopidogrel (PLAVIX) 75 MG tablet TAKE 1 TABLET BY MOUTH EVERY DAY 90 tablet 3  . fluticasone (FLONASE) 50 MCG/ACT nasal spray Place 2 sprays into both nostrils daily.     [name] glucose blood (ONETOUCH VERIO) test strip Use to test blood sugars daily. Dx: E11.9 100 each 0  . Insulin Pen Needle (BD PEN NEEDLE NANO 2ND GEN) 32G X 4 MM MISC 1 Package by Does not apply route 2 (two) times daily. 100 each 0  . Lancets (ONETOUCH ULTRASOFT) lancets Use to test blood sugars daily. Dx: E11.9 100 each 12  . loratadine (CLARITIN) 10 MG tablet Take 10 mg by mouth at bedtime.     .  metFORMIN (GLUCOPHAGE) 500 MG tablet TAKE 1 TABLET WITH BREAKFAST AND 1/2 TO 1 TABLET WITH DINNER. 180 tablet 1  . metoCLOPramide (REGLAN) 10 MG tablet TAKE 1/2 TABLET BY MOUTH 4 TIMES A DAY 60 tablet 5  . metoprolol tartrate (LOPRESSOR) 25 MG tablet TAKE 1/2 TAB BY MOUTH TWICE A DAY 30 tablet 5  . Multiple Vitamin (MULTIVITAMIN WITH MINERALS) TABS tablet Take 1 tablet by mouth daily.    . naproxen sodium (ALEVE) 220 MG tablet Take 220 mg by mouth.    [name] omeprazole (PRILOSEC OTC) 20 MG tablet Take 40 mg by mouth daily with breakfast.    . Probiotic Product (ALIGN) 4 MG CAPS Take 4 mg by mouth daily.     . psyllium (METAMUCIL SMOOTH TEXTURE) 28 % packet Take 1 packet by mouth daily before breakfast.     . venlafaxine XR (EFFEXOR-XR) 75 MG 24 hr capsule TAKE 1 CAPSULE (75 MG TOTAL) BY MOUTH DAILY WITH BREAKFAST. 90 capsule 2   No current facility-administered medications on file prior to visit.     PAST MEDICAL HISTORY: Past Medical History:  Diagnosis Date  .  Allergy   . Anxiety   . Arthritis    back & knee  . Asthma     mild per pt shows up with resp illness  . Chronic diastolic congestive heart failure ([place])   . Colon polyps   . Constipation   . Diabetes mellitus without complication ([place])   . Dyspnea   . Gastric polyps   . Gastroparesis   . GERD (gastroesophageal reflux disease)   . Headache    sinus headaches and migraines at times  . Heart murmur   . History of migraine headaches   . HTN (hypertension)   . Hyperlipidemia   . IBS (irritable bowel syndrome)   . Joint pain   . Lumbar disc disease   . S/P aortic valve replacement with bioprosthetic valve 08/23/2016   25 mm Edwards Intuity rapid-deployment bovine pericardial tissue valve via partial upper mini sternotomy  . Sleep apnea   . TIA (transient ischemic attack)     PAST SURGICAL HISTORY: Past Surgical History:  Procedure Laterality Date  . ABDOMINAL HYSTERECTOMY    . AORTIC VALVE REPLACEMENT N/A 08/23/2016    Procedure: AORTIC VALVE REPLACEMENT (AVR) - using partial Upper Sternotomy- 54mm Edwards Intuity Aortic Valve used;  Surgeon: Rexene Alberts, MD;  Location: [place];  Service: Open Heart Surgery;  Laterality: N/A;  . BLADDER SUSPENSION  2007  . BREAST ENHANCEMENT SURGERY  1980  . CARPAL TUNNEL RELEASE Bilateral   . COLONOSCOPY    . DORSAL COMPARTMENT RELEASE Right 09/15/2014   Procedure: RIGHT WRIST DEQUERVAINS RELEASE ;  Surgeon: Kathryne Hitch, MD;  Location: [place];  Service: Orthopedics;  Laterality: Right;  . ESOPHAGOGASTRODUODENOSCOPY    . KNEE ARTHROSCOPY  04/15/2012   Procedure: ARTHROSCOPY KNEE;  Surgeon: [name], MD;  Location: [place];  Service: Orthopedics;  Laterality: Right;  . LAPAROSCOPIC CHOLECYSTECTOMY    . LASIK    . OOPHORECTOMY    . PALATE TO GINGIVA GRAFT  2017  . RIGHT/LEFT HEART CATH AND CORONARY ANGIOGRAPHY N/A 08/02/2016   Procedure: Right/Left Heart Cath and Coronary Angiography;  Surgeon: Sherren Mocha, MD;  Location: [facility];  Service: Cardiovascular;  Laterality: N/A;  . ROTATOR CUFF REPAIR Left   . TEE WITHOUT CARDIOVERSION N/A 08/23/2016   Procedure: TRANSESOPHAGEAL ECHOCARDIOGRAM (TEE);  Surgeon: Rexene Alberts, MD;  Location: [facility];  Service: Open Heart Surgery;  Laterality: N/A;  . TOTAL KNEE ARTHROPLASTY  04/15/2012   Procedure: TOTAL KNEE ARTHROPLASTY;  Surgeon: [name], MD;  Location: [facility];  Service: Orthopedics;  Laterality: Right;  . TRIGGER FINGER RELEASE Right 04/20/2015   Procedure: RIGHT TRIGGER FINGER RELEASE (TENDON SHEALTH INCISION) ;  Surgeon: [name], MD;  Location: [place];  Service: Orthopedics;  Laterality: Right;    SOCIAL HISTORY: Social History   Tobacco Use  . Smoking status: Never Smoker  . Smokeless tobacco: Never Used  Substance Use Topics  . Alcohol use: No  . Drug use: No    FAMILY HISTORY: Family History  Problem Relation Age of Onset  . COPD Mother    . Colon polyps Mother   . Irritable bowel syndrome Mother   . Anxiety disorder Mother   . Heart disease Father   . Alcohol abuse Father   . Colon polyps Maternal Aunt    ROS: Review of Systems  Constitutional: Positive for weight loss.  Gastrointestinal: Negative for diarrhea, nausea and vomiting.  Musculoskeletal:  Negative for muscle weakness.  Endo/Heme/Allergies:       Negative for hypoglycemia.   PHYSICAL EXAM: Blood pressure (!) 154/67, pulse (!) 51, temperature 98.1 F (36.7 C), height 5\' 5"  (1.651 m), weight 202 lb (91.6 kg), SpO2 (!) 51 %. Body mass index is 33.61 kg/m. Physical Exam Vitals signs reviewed.  Constitutional:      Appearance: Normal appearance. She is obese.  Cardiovascular:     Rate and Rhythm: Normal rate.     Pulses: Normal pulses.  Pulmonary:     Effort: Pulmonary effort is normal.     Breath sounds: Normal breath sounds.  Musculoskeletal: Normal range of motion.  Skin:    General: Skin is warm and dry.  Neurological:     Mental Status: She is alert and oriented to person, place, and time.  Psychiatric:        Behavior: Behavior normal.   RECENT LABS AND TESTS: BMET    Component Value Date/Time   NA 131 (L) 06/04/2018 1012   NA 129 (L) 01/19/2018 0959   K 4.6 06/04/2018 1012   CL 97 06/04/2018 1012   CO2 27 06/04/2018 1012   GLUCOSE 109 (H) 06/04/2018 1012   BUN 18 06/04/2018 1012   BUN 16 01/19/2018 0959   CREATININE 0.61 06/04/2018 1012   CALCIUM 9.1 06/04/2018 1012   GFRNONAA 79 01/19/2018 0959   GFRAA 91 01/19/2018 0959   Lab Results  Component Value Date   HGBA1C 6.1 06/04/2018   HGBA1C 6.6 (H) 01/19/2018   HGBA1C 6.2 (A) 11/25/2017   HGBA1C 6.4 05/23/2017   HGBA1C 6.7 (H) 01/02/2017   Lab Results  Component Value Date   INSULIN 12.1 01/19/2018   CBC    Component Value Date/Time   WBC 8.8 06/04/2018 1012   RBC 4.51 06/04/2018 1012   HGB 11.8 (L) 06/04/2018 1012   HGB 11.7 01/19/2018 0959   HCT 36.3  06/04/2018 1012   HCT 35.7 01/19/2018 0959   PLT 346.0 06/04/2018 1012   PLT 358 04/24/2017 1231   MCV 80.5 06/04/2018 1012   MCV 81 01/19/2018 0959   MCH 26.5 (L) 01/19/2018 0959   MCH 27.4 08/26/2016 0248   MCHC 32.5 06/04/2018 1012   RDW 15.7 (H) 06/04/2018 1012   RDW 14.9 01/19/2018 0959   LYMPHSABS 2.9 01/19/2018 0959   MONOABS 0.6 05/11/2015 0836   EOSABS 0.1 01/19/2018 0959   BASOSABS 0.0 01/19/2018 0959   Iron/TIBC/Ferritin/ %Sat No results found for: IRON, TIBC, FERRITIN, IRONPCTSAT Lipid Panel     Component Value Date/Time   CHOL 116 06/04/2018 1012   CHOL 123 01/19/2018 0959   TRIG 70.0 06/04/2018 1012   HDL 47.40 06/04/2018 1012   HDL 54 01/19/2018 0959   CHOLHDL 2 06/04/2018 1012   VLDL 14.0 06/04/2018 1012   LDLCALC 54 06/04/2018 1012   LDLCALC 54 01/19/2018 0959   LDLDIRECT 58.0 11/25/2017 1156   Hepatic Function Panel     Component Value Date/Time   PROT 6.5 06/04/2018 1012   PROT 6.6 01/19/2018 0959   ALBUMIN 4.4 06/04/2018 1012   ALBUMIN 4.6 01/19/2018 0959   AST 22 06/04/2018 1012   ALT 15 06/04/2018 1012   ALKPHOS 151 (H) 06/04/2018 1012   BILITOT 0.4 06/04/2018 1012   BILITOT 0.5 01/19/2018 0959   BILIDIR 0.1 05/11/2015 0836      Component Value Date/Time   TSH 3.38 06/04/2018 1012   TSH 5.300 (H) 01/19/2018 0959   TSH 3.390 04/24/2017 1231  Results for MAUDY, YONAN (MRN [id number]) as of 09/10/2018 14:42  Ref. Range 01/19/2018 09:59  Vitamin D, 25-Hydroxy Latest Ref Range: 30.0 - 100.0 ng/mL 27.9 (L)   OBESITY BEHAVIORAL INTERVENTION VISIT  Today's visit was #15   Starting weight: 220 lbs Starting date: 01/19/2018 Today's weight: 202 lbs  Today's date: 09/10/2018 Total lbs lost to date: 18    09/10/2018  Height 5\' 5"  (1.651 m)  Weight 202 lb (91.6 kg)  BMI (Calculated) 33.61  BLOOD PRESSURE - SYSTOLIC 076  BLOOD PRESSURE - DIASTOLIC 67   Body Fat % 80.8 %  Total Body Water (lbs) 74.2 lbs   ASK: We discussed the  diagnosis of obesity with Wessie P Joerger today and [name] agreed to give [name] permission to discuss obesity behavioral modification therapy today.  ASSESS: Jericka has the diagnosis of obesity and her BMI today is 33.61. Arlayne is in the action stage of change.   ADVISE: Aliyha was educated on the multiple health risks of obesity as well as the benefit of weight loss to improve her health. She was advised of the need for long term treatment and the importance of lifestyle modifications to improve her current health and to decrease her risk of future health problems.  AGREE: Multiple dietary modification options and treatment options were discussed and  Radiah agreed to follow the recommendations documented in the above note.  ARRANGE: Sojourner was educated on the importance of frequent visits to treat obesity as outlined per CMS and USPSTF guidelines and agreed to schedule her next follow up appointment today.  Migdalia Dk, am acting as transcriptionist for [name], PA-C I, [name], PA-C have reviewed above note and agree with its content

## 2018-09-15 ENCOUNTER — Encounter (INDEPENDENT_AMBULATORY_CARE_PROVIDER_SITE_OTHER): Payer: Self-pay

## 2018-09-20 ENCOUNTER — Encounter: Payer: Self-pay | Admitting: Family Medicine

## 2018-09-21 ENCOUNTER — Ambulatory Visit (INDEPENDENT_AMBULATORY_CARE_PROVIDER_SITE_OTHER): Payer: Medicare Other | Admitting: Family Medicine

## 2018-09-21 ENCOUNTER — Encounter: Payer: Self-pay | Admitting: Family Medicine

## 2018-09-21 VITALS — Temp 98.7°F | Ht 65.0 in

## 2018-09-21 DIAGNOSIS — J0111 Acute recurrent frontal sinusitis: Secondary | ICD-10-CM

## 2018-09-21 MED ORDER — CEFDINIR 300 MG PO CAPS: 300.0000 mg | ORAL_CAPSULE | Freq: Two times a day (BID) | ORAL | 0 refills | Status: DC

## 2018-09-21 NOTE — Progress Notes (Signed)
Phone [phone]   Subjective:  Virtual visit via Video note  Our team/I connected with [place] on 09/21/18 at  3:00 PM EDT by a video enabled telemedicine application (webex) and verified that I am speaking with the correct person using two identifiers.  Location patient: [facility] Location provider: [facility], office Persons participating in the virtual visit: patient and husband  Our team/I discussed the limitations of evaluation and management by telemedicine and the availability of in person appointments. In light of current covid-19 pandemic, patient also understands that we are trying to protect them by minimizing in office contact if at all possible.  The patient expressed consent for telemedicine visit and agreed to proceed.   ROS- no fever. Rare cough. Some sneezing. No chest pain or shortness of breath.    Past Medical History-  Patient Active Problem List   Diagnosis Date Noted  . History of CVA in adulthood 08/13/2017    Priority: High  . S/P minimally invasive aortic valve replacement with bioprosthetic valve 08/23/2016    Priority: High  . Diastolic dysfunction     Priority: High  . Diabetes mellitus type 2, controlled ([place]) 05/12/2014    Priority: High  . Hypertension 01/02/2017    Priority: Medium  . Osteopenia 05/27/2016    Priority: Medium  . OSA (obstructive sleep apnea) 02/27/2016    Priority: Medium  . Elevated alkaline phosphatase level 05/16/2015    Priority: Medium  . GERD (gastroesophageal reflux disease) 03/10/2014    Priority: Medium  . Generalized anxiety disorder 03/10/2014    Priority: Medium  . Migraines 03/10/2014    Priority: Medium  . Irritable bowel syndrome 12/15/2008    Priority: Medium  . OBESITY, MORBID 10/12/2008    Priority: Medium  . Hyperlipidemia 02/09/2008    Priority: Medium  . Bruxism 10/23/2015    Priority: Low  . Atypical chest pain 11/10/2014    Priority: Low  . Acne 05/12/2014    Priority: Low  .  EUSTACHIAN TUBE DYSFUNCTION, CHRONIC 06/14/2010    Priority: Low  . Low back pain 08/11/2008    Priority: Low  . Allergic rhinitis 02/12/2007    Priority: Low  . ASTHMA 02/12/2007    Priority: Low  . Vitamin D deficiency 06/04/2018  . Hyponatremia 02/17/2018  . Blurry vision 08/13/2017    Medications- reviewed and updated Current Outpatient Medications  Medication Sig Dispense Refill  . aspirin EC 81 MG tablet Take 1 tablet (81 mg total) by mouth daily. 90 tablet 3  . atorvastatin (LIPITOR) 40 MG tablet TAKE 1 TABLET (40 MG TOTAL) BY MOUTH DAILY AT 6 PM. 90 tablet 3  . Biotin 5000 MCG TABS Take 5,000 mcg by mouth daily.     . calcium-vitamin D (OSCAL WITH D) 500-200 MG-UNIT per tablet Take 2 tablets by mouth daily.     . cefdinir (OMNICEF) 300 MG capsule Take 1 capsule (300 mg total) by mouth 2 (two) times daily. 28 capsule 0  . clopidogrel (PLAVIX) 75 MG tablet TAKE 1 TABLET BY MOUTH EVERY DAY 90 tablet 3  . fluticasone (FLONASE) 50 MCG/ACT nasal spray Place 2 sprays into both nostrils daily.     [name] glucose blood (ONETOUCH VERIO) test strip Use to test blood sugars daily. Dx: E11.9 100 each 0  . Insulin Pen Needle (BD PEN NEEDLE NANO 2ND GEN) 32G X 4 MM MISC 1 Package by Does not apply route 2 (two) times daily. 100 each 0  . Lancets (ONETOUCH ULTRASOFT) lancets  Use to test blood sugars daily. Dx: E11.9 100 each 12  . loratadine (CLARITIN) 10 MG tablet Take 10 mg by mouth at bedtime.     . metFORMIN (GLUCOPHAGE) 500 MG tablet TAKE 1 TABLET WITH BREAKFAST AND 1/2 TO 1 TABLET WITH DINNER. 180 tablet 1  . metoCLOPramide (REGLAN) 10 MG tablet TAKE 1/2 TABLET BY MOUTH 4 TIMES A DAY 60 tablet 5  . metoprolol tartrate (LOPRESSOR) 25 MG tablet TAKE 1/2 TAB BY MOUTH TWICE A DAY 30 tablet 5  . Multiple Vitamin (MULTIVITAMIN WITH MINERALS) TABS tablet Take 1 tablet by mouth daily.    . naproxen sodium (ALEVE) 220 MG tablet Take 220 mg by mouth.    [name] omeprazole (PRILOSEC OTC) 20 MG tablet Take 40  mg by mouth daily with breakfast.    . Probiotic Product (ALIGN) 4 MG CAPS Take 4 mg by mouth daily.     . psyllium (METAMUCIL SMOOTH TEXTURE) 28 % packet Take 1 packet by mouth daily before breakfast.     . venlafaxine XR (EFFEXOR-XR) 75 MG 24 hr capsule TAKE 1 CAPSULE (75 MG TOTAL) BY MOUTH DAILY WITH BREAKFAST. 90 capsule 2  . Vitamin D, Ergocalciferol, (DRISDOL) 1.25 MG (50000 UT) CAPS capsule Take 1 capsule (50,000 Units total) by mouth every 7 (seven) days. 4 capsule 0  . clindamycin (CLEOCIN) 300 MG capsule TAKE 2 CAPSULES BY MOUTH 1 HOUR PRIOR TOO DENTAL CLEANING. (Patient not taking: Reported on 09/21/2018) 2 capsule 0   No current facility-administered medications for this visit.      Objective:  Temp 98.7 F (37.1 C) (Oral)   Ht 5\' 5"  (1.651 m)   LMP  (LMP Unknown)   BMI 33.61 kg/m  Gen: NAD, resting comfortably Points to frontal sinuses as source of pain. Tender when she taps those areas Lungs: nonlabored, normal respiratory rate  Skin: warm, dry, no obvious rash Normal speech     Assessment and Plan   #frontal sinusitis S: went to visit relatives last week of February. Saw Dr. Rogers Blocker and diagnosed with sinusitis- was given cefdinir and felt significantly better- but by the time she got back home symptoms recurred again.   Saline spray has been tried but this seems to burn some. Aleve has been tried for 2 weeks and that seems to help. Symptoms before antibiotics were identical.   Doxycycline has not been effective in the past for sinus infections A/P: Given prior resolution of almost identical symptoms on 10 days of cefdinir-I think this increases the likelihood that this could be a lingering sinus infection.  We will extend cefdinir to 14 days-originally consider doxycycline but patient has low confidence in this due to prior lack of success on doxycycline.  Depending on trajectory-could consider neuroimaging or CT imaging of the sinuses versus 21-day trial of cefdinir if  she has complete relief but recurrence again.  Future Appointments  Date Time Provider [place]  09/29/2018 11:30 AM [name], PA-C [facility] None  10/06/2018 11:00 AM [name], MD [facility]  12/31/2018 11:30 AM Parrett, Fonnie Mu, NP [facility] None  06/14/2019  3:00 PM [id number]  07/20/2019  1:30 PM Pieter Partridge, DO [facility] None   Meds ordered this encounter  Medications  . cefdinir (OMNICEF) 300 MG capsule    Sig: Take 1 capsule (300 mg total) by mouth 2 (two) times daily.    Dispense:  28 capsule    Refill:  0   Return precautions  advised.  Garret Reddish, MD

## 2018-09-21 NOTE — Telephone Encounter (Signed)
Please call pt and schedule Webex.

## 2018-09-21 NOTE — Patient Instructions (Addendum)
Video visit-patient to return for worsening symptoms or symptoms that fail to resolve with treatment

## 2018-09-29 ENCOUNTER — Other Ambulatory Visit: Payer: Self-pay

## 2018-09-29 ENCOUNTER — Ambulatory Visit (INDEPENDENT_AMBULATORY_CARE_PROVIDER_SITE_OTHER): Payer: Medicare Other | Admitting: Physician Assistant

## 2018-09-29 ENCOUNTER — Encounter (INDEPENDENT_AMBULATORY_CARE_PROVIDER_SITE_OTHER): Payer: Self-pay | Admitting: Physician Assistant

## 2018-09-29 DIAGNOSIS — Z6833 Body mass index (BMI) 33.0-33.9, adult: Secondary | ICD-10-CM

## 2018-09-29 DIAGNOSIS — E119 Type 2 diabetes mellitus without complications: Secondary | ICD-10-CM | POA: Diagnosis not present

## 2018-09-29 DIAGNOSIS — E669 Obesity, unspecified: Secondary | ICD-10-CM

## 2018-09-29 DIAGNOSIS — E559 Vitamin D deficiency, unspecified: Secondary | ICD-10-CM

## 2018-09-29 MED ORDER — VITAMIN D (ERGOCALCIFEROL) 1.25 MG (50000 UNIT) PO CAPS: 50000.0000 [IU] | ORAL_CAPSULE | ORAL | 0 refills | Status: DC

## 2018-09-29 NOTE — Progress Notes (Signed)
Office: [phone]  /  Fax: [phone] TeleHealth Visit:  [name] has verbally consented to this TeleHealth visit today. The patient is located at home, the provider is located at the [facility] office. The participants in this visit include the listed provider and patient. The visit was conducted today via face time.  HPI:   Chief Complaint: OBESITY [name] is here to discuss her progress with her obesity treatment plan. She is on the Category 3 plan and is following her eating plan approximately 85-90 % of the time. She states she is exercising 0 minutes 0 times per week. Celsa reports that she has been sick with a sinus infection, which has decreased her appetite. She is feeling better and is ready to get back on track.  We were unable to weigh the patient today for this TeleHealth visit. She feels as if she has maintained her weight since her last visit. She has lost 18 lbs since starting treatment with [name].  Vitamin D Deficiency [name] has a diagnosis of vitamin D deficiency. She is currently taking prescription Vit D and denies nausea, vomiting or muscle weakness.  Diabetes II [name] has a diagnosis of diabetes type II. [name] states fasting BGs range in 120's. She is on metformin and denies nausea, vomiting, or diarrhea. Last A1c was 6.1. She denies polyphagia or hypoglycemia. She has been working on intensive lifestyle modifications including diet, exercise, and weight loss to help control her blood glucose levels.  ASSESSMENT AND PLAN:  Vitamin D deficiency - Plan: Vitamin D, Ergocalciferol, (DRISDOL) 1.25 MG (50000 UT) CAPS capsule  Type 2 diabetes mellitus without complication, without long-term current use of insulin (HCC)  Class 1 obesity with serious comorbidity and body mass index (BMI) of 33.0 to 33.9 in adult, unspecified obesity type  PLAN:  Vitamin D Deficiency [name] was informed that low vitamin D levels  contributes to fatigue and are associated with obesity, breast, and colon cancer. Briane agrees to continue taking prescription Vit D @50 ,000 IU every week #4 and we will refill for 1 month. She will follow up for routine testing of vitamin D, at least 2-3 times per year. She was informed of the risk of over-replacement of vitamin D and agrees to not increase her dose unless she discusses this with [name] first. [name] agrees to follow up with our clinic in 2 weeks.  Diabetes II [name] has been given extensive diabetes education by myself today including ideal fasting and post-prandial blood glucose readings, individual ideal Hgb A1c goals and hypoglycemia prevention. We discussed the importance of good blood sugar control to decrease the likelihood of diabetic complications such as nephropathy, neuropathy, limb loss, blindness, coronary artery disease, and death. We discussed the importance of intensive lifestyle modification including diet, exercise and weight loss as the first line treatment for diabetes. [name] agrees to continue taking metformin, and will continue with weight loss. [name] agrees to follow up with our clinic in 2 weeks.  Obesity [name] is currently in the action stage of change. As such, her goal is to continue with weight loss efforts She has agreed to follow the Category 3 plan [name] has been instructed to work up to a goal of 150 minutes of combined cardio and strengthening exercise per week for weight loss and overall health benefits. We discussed the following Behavioral Modification Strategies today: increasing lean protein intake and work on meal planning and easy cooking plans   [name] has agreed to follow up with our  clinic in 2 weeks. She was informed of the importance of frequent follow up visits to maximize her success with intensive lifestyle modifications for her multiple health conditions.  ALLERGIES: Allergies  Allergen Reactions  .  Augmentin [Amoxicillin-Pot Clavulanate] Rash    rash  . Erythromycin Nausea Only  . Penicillins Itching and Rash    Has patient had a PCN reaction causing immediate rash, facial/tongue/throat swelling, SOB or lightheadedness with hypotension: No Has patient had a PCN reaction causing severe rash involving mucus membranes or skin necrosis: No Has patient had a PCN reaction that required hospitalization: No Has patient had a PCN reaction occurring within the last 10 years: No  If all of the above answers are "NO", then may proceed with Cephalosporin use.     MEDICATIONS: Current Outpatient Medications on File Prior to Visit  Medication Sig Dispense Refill  . aspirin EC 81 MG tablet Take 1 tablet (81 mg total) by mouth daily. 90 tablet 3  . atorvastatin (LIPITOR) 40 MG tablet TAKE 1 TABLET (40 MG TOTAL) BY MOUTH DAILY AT 6 PM. 90 tablet 3  . Biotin 5000 MCG TABS Take 5,000 mcg by mouth daily.     . calcium-vitamin D (OSCAL WITH D) 500-200 MG-UNIT per tablet Take 2 tablets by mouth daily.     . cefdinir (OMNICEF) 300 MG capsule Take 1 capsule (300 mg total) by mouth 2 (two) times daily. 28 capsule 0  . clindamycin (CLEOCIN) 300 MG capsule TAKE 2 CAPSULES BY MOUTH 1 HOUR PRIOR TOO DENTAL CLEANING. (Patient not taking: Reported on 09/21/2018) 2 capsule 0  . clopidogrel (PLAVIX) 75 MG tablet TAKE 1 TABLET BY MOUTH EVERY DAY 90 tablet 3  . fluticasone (FLONASE) 50 MCG/ACT nasal spray Place 2 sprays into both nostrils daily.     [name] glucose blood (ONETOUCH VERIO) test strip Use to test blood sugars daily. Dx: E11.9 100 each 0  . Insulin Pen Needle (BD PEN NEEDLE NANO 2ND GEN) 32G X 4 MM MISC 1 Package by Does not apply route 2 (two) times daily. 100 each 0  . Lancets (ONETOUCH ULTRASOFT) lancets Use to test blood sugars daily. Dx: E11.9 100 each 12  . loratadine (CLARITIN) 10 MG tablet Take 10 mg by mouth at bedtime.     . metFORMIN (GLUCOPHAGE) 500 MG tablet TAKE 1 TABLET WITH BREAKFAST AND 1/2 TO 1  TABLET WITH DINNER. 180 tablet 1  . metoCLOPramide (REGLAN) 10 MG tablet TAKE 1/2 TABLET BY MOUTH 4 TIMES A DAY 60 tablet 5  . metoprolol tartrate (LOPRESSOR) 25 MG tablet TAKE 1/2 TAB BY MOUTH TWICE A DAY 30 tablet 5  . Multiple Vitamin (MULTIVITAMIN WITH MINERALS) TABS tablet Take 1 tablet by mouth daily.    . naproxen sodium (ALEVE) 220 MG tablet Take 220 mg by mouth.    [name] omeprazole (PRILOSEC OTC) 20 MG tablet Take 40 mg by mouth daily with breakfast.    . Probiotic Product (ALIGN) 4 MG CAPS Take 4 mg by mouth daily.     . psyllium (METAMUCIL SMOOTH TEXTURE) 28 % packet Take 1 packet by mouth daily before breakfast.     . venlafaxine XR (EFFEXOR-XR) 75 MG 24 hr capsule TAKE 1 CAPSULE (75 MG TOTAL) BY MOUTH DAILY WITH BREAKFAST. 90 capsule 2   No current facility-administered medications on file prior to visit.     PAST MEDICAL HISTORY: Past Medical History:  Diagnosis Date  . Allergy   . Anxiety   . Arthritis  back & knee  . Asthma     mild per pt shows up with resp illness  . Chronic diastolic congestive heart failure ([place])   . Colon polyps   . Constipation   . Diabetes mellitus without complication ([place])   . Dyspnea   . Gastric polyps   . Gastroparesis   . GERD (gastroesophageal reflux disease)   . Headache    sinus headaches and migraines at times  . Heart murmur   . History of migraine headaches   . HTN (hypertension)   . Hyperlipidemia   . IBS (irritable bowel syndrome)   . Joint pain   . Lumbar disc disease   . S/P aortic valve replacement with bioprosthetic valve 08/23/2016   25 mm Edwards Intuity rapid-deployment bovine pericardial tissue valve via partial upper mini sternotomy  . Sleep apnea   . TIA (transient ischemic attack)     PAST SURGICAL HISTORY: Past Surgical History:  Procedure Laterality Date  . ABDOMINAL HYSTERECTOMY    . AORTIC VALVE REPLACEMENT N/A 08/23/2016   Procedure: AORTIC VALVE REPLACEMENT (AVR) - using partial Upper Sternotomy- 50mm  Edwards Intuity Aortic Valve used;  Surgeon: Rexene Alberts, MD;  Location: [facility];  Service: Open Heart Surgery;  Laterality: N/A;  . BLADDER SUSPENSION  2007  . BREAST ENHANCEMENT SURGERY  1980  . CARPAL TUNNEL RELEASE Bilateral   . COLONOSCOPY    . DORSAL COMPARTMENT RELEASE Right 09/15/2014   Procedure: RIGHT WRIST DEQUERVAINS RELEASE ;  Surgeon: Kathryne Hitch, MD;  Location: [facility];  Service: Orthopedics;  Laterality: Right;  . ESOPHAGOGASTRODUODENOSCOPY    . KNEE ARTHROSCOPY  04/15/2012   Procedure: ARTHROSCOPY KNEE;  Surgeon: [name], MD;  Location: [facility];  Service: Orthopedics;  Laterality: Right;  . LAPAROSCOPIC CHOLECYSTECTOMY    . LASIK    . OOPHORECTOMY    . PALATE TO GINGIVA GRAFT  2017  . RIGHT/LEFT HEART CATH AND CORONARY ANGIOGRAPHY N/A 08/02/2016   Procedure: Right/Left Heart Cath and Coronary Angiography;  Surgeon: Sherren Mocha, MD;  Location: [facility];  Service: Cardiovascular;  Laterality: N/A;  . ROTATOR CUFF REPAIR Left   . TEE WITHOUT CARDIOVERSION N/A 08/23/2016   Procedure: TRANSESOPHAGEAL ECHOCARDIOGRAM (TEE);  Surgeon: Rexene Alberts, MD;  Location: [facility];  Service: Open Heart Surgery;  Laterality: N/A;  . TOTAL KNEE ARTHROPLASTY  04/15/2012   Procedure: TOTAL KNEE ARTHROPLASTY;  Surgeon: [name], MD;  Location: [facility];  Service: Orthopedics;  Laterality: Right;  . TRIGGER FINGER RELEASE Right 04/20/2015   Procedure: RIGHT TRIGGER FINGER RELEASE (TENDON SHEALTH INCISION) ;  Surgeon: [name], MD;  Location: [facility];  Service: Orthopedics;  Laterality: Right;    SOCIAL HISTORY: Social History   Tobacco Use  . Smoking status: Never Smoker  . Smokeless tobacco: Never Used  Substance Use Topics  . Alcohol use: No  . Drug use: No    FAMILY HISTORY: Family History  Problem Relation Age of Onset  . COPD Mother   . Colon polyps Mother   . Irritable bowel syndrome Mother   . Anxiety disorder  Mother   . Heart disease Father   . Alcohol abuse Father   . Colon polyps Maternal Aunt     ROS: Review of Systems  Constitutional: Negative for weight loss.  Gastrointestinal: Negative for diarrhea, nausea and vomiting.  Musculoskeletal:       Negative muscle weakness  Endo/Heme/Allergies:  Negative hypoglycemia Negative polyphagia    PHYSICAL EXAM: Pt in no acute distress  RECENT LABS AND TESTS: BMET    Component Value Date/Time   NA 131 (L) 06/04/2018 1012   NA 129 (L) 01/19/2018 0959   K 4.6 06/04/2018 1012   CL 97 06/04/2018 1012   CO2 27 06/04/2018 1012   GLUCOSE 109 (H) 06/04/2018 1012   BUN 18 06/04/2018 1012   BUN 16 01/19/2018 0959   CREATININE 0.61 06/04/2018 1012   CALCIUM 9.1 06/04/2018 1012   GFRNONAA 79 01/19/2018 0959   GFRAA 91 01/19/2018 0959   Lab Results  Component Value Date   HGBA1C 6.1 06/04/2018   HGBA1C 6.6 (H) 01/19/2018   HGBA1C 6.2 (A) 11/25/2017   HGBA1C 6.4 05/23/2017   HGBA1C 6.7 (H) 01/02/2017   Lab Results  Component Value Date   INSULIN 12.1 01/19/2018   CBC    Component Value Date/Time   WBC 8.8 06/04/2018 1012   RBC 4.51 06/04/2018 1012   HGB 11.8 (L) 06/04/2018 1012   HGB 11.7 01/19/2018 0959   HCT 36.3 06/04/2018 1012   HCT 35.7 01/19/2018 0959   PLT 346.0 06/04/2018 1012   PLT 358 04/24/2017 1231   MCV 80.5 06/04/2018 1012   MCV 81 01/19/2018 0959   MCH 26.5 (L) 01/19/2018 0959   MCH 27.4 08/26/2016 0248   MCHC 32.5 06/04/2018 1012   RDW 15.7 (H) 06/04/2018 1012   RDW 14.9 01/19/2018 0959   LYMPHSABS 2.9 01/19/2018 0959   MONOABS 0.6 05/11/2015 0836   EOSABS 0.1 01/19/2018 0959   BASOSABS 0.0 01/19/2018 0959   Iron/TIBC/Ferritin/ %Sat No results found for: IRON, TIBC, FERRITIN, IRONPCTSAT Lipid Panel     Component Value Date/Time   CHOL 116 06/04/2018 1012   CHOL 123 01/19/2018 0959   TRIG 70.0 06/04/2018 1012   HDL 47.40 06/04/2018 1012   HDL 54 01/19/2018 0959   CHOLHDL 2 06/04/2018 1012    VLDL 14.0 06/04/2018 1012   LDLCALC 54 06/04/2018 1012   LDLCALC 54 01/19/2018 0959   LDLDIRECT 58.0 11/25/2017 1156   Hepatic Function Panel     Component Value Date/Time   PROT 6.5 06/04/2018 1012   PROT 6.6 01/19/2018 0959   ALBUMIN 4.4 06/04/2018 1012   ALBUMIN 4.6 01/19/2018 0959   AST 22 06/04/2018 1012   ALT 15 06/04/2018 1012   ALKPHOS 151 (H) 06/04/2018 1012   BILITOT 0.4 06/04/2018 1012   BILITOT 0.5 01/19/2018 0959   BILIDIR 0.1 05/11/2015 0836      Component Value Date/Time   TSH 3.38 06/04/2018 1012   TSH 5.300 (H) 01/19/2018 0959   TSH 3.390 04/24/2017 1231      I, Trixie Dredge, am acting as transcriptionist for [name], PA-C I, [name], PA-C have reviewed above note and agree with its content

## 2018-10-01 ENCOUNTER — Other Ambulatory Visit (INDEPENDENT_AMBULATORY_CARE_PROVIDER_SITE_OTHER): Payer: Self-pay | Admitting: Physician Assistant

## 2018-10-01 DIAGNOSIS — E559 Vitamin D deficiency, unspecified: Secondary | ICD-10-CM

## 2018-10-02 ENCOUNTER — Ambulatory Visit: Payer: Medicare Other | Admitting: Adult Health

## 2018-10-06 ENCOUNTER — Ambulatory Visit: Payer: Medicare Other | Admitting: Family Medicine

## 2018-10-06 ENCOUNTER — Ambulatory Visit: Payer: Medicare Other | Admitting: Adult Health

## 2018-10-08 ENCOUNTER — Encounter: Payer: Self-pay | Admitting: Family Medicine

## 2018-10-08 ENCOUNTER — Ambulatory Visit (INDEPENDENT_AMBULATORY_CARE_PROVIDER_SITE_OTHER): Payer: Medicare Other | Admitting: Family Medicine

## 2018-10-08 VITALS — Temp 98.0°F | Ht 65.0 in | Wt 201.0 lb

## 2018-10-08 DIAGNOSIS — R519 Headache, unspecified: Secondary | ICD-10-CM

## 2018-10-08 DIAGNOSIS — R51 Headache: Secondary | ICD-10-CM

## 2018-10-08 MED ORDER — PREDNISONE 20 MG PO TABS: ORAL_TABLET | ORAL | 0 refills | Status: DC

## 2018-10-08 NOTE — Patient Instructions (Signed)
Video visit

## 2018-10-08 NOTE — Progress Notes (Signed)
Phone [phone]   Subjective:  Virtual visit via Video note. Chief complaint: Chief Complaint  Patient presents with  . Headache    Sinus    This visit type was conducted due to national recommendations for restrictions regarding the COVID-19 Pandemic (e.g. social distancing).  This format is felt to be most appropriate for this patient at this time balancing risks to patient and risks to population by having him in for in person visit.  No physical exam was performed (except for noted visual exam or audio findings with Telehealth visits).    Our team/I connected with [name] on 10/08/18 at  1:40 PM EDT by a video enabled telemedicine application ([email or web address]) and verified that I am speaking with the correct person using two identifiers.  Location patient: Home[facility] Location provider: [facility], office Persons participating in the virtual visit:  patient  Our team/I discussed the limitations of evaluation and management by telemedicine and the availability of in person appointments. In light of current covid-19 pandemic, patient also understands that we are trying to protect them by minimizing in office contact if at all possible.  The patient expressed consent for telemedicine visit and agreed to proceed. Patient understands insurance will be billed.   ROS- no fever/chills/nausea/vomiting. Minimal cough- only with nasal spray   Past Medical History-  Patient Active Problem List   Diagnosis Date Noted  . History of CVA in adulthood 08/13/2017    Priority: High  . S/P minimally invasive aortic valve replacement with bioprosthetic valve 08/23/2016    Priority: High  . Diastolic dysfunction     Priority: High  . Diabetes mellitus type 2, controlled ([place]) 05/12/2014    Priority: High  . Hypertension 01/02/2017    Priority: Medium  . Osteopenia 05/27/2016    Priority: Medium  . OSA (obstructive sleep apnea) 02/27/2016    Priority: Medium  . Elevated alkaline phosphatase  level 05/16/2015    Priority: Medium  . GERD (gastroesophageal reflux disease) 03/10/2014    Priority: Medium  . Generalized anxiety disorder 03/10/2014    Priority: Medium  . Migraines 03/10/2014    Priority: Medium  . Irritable bowel syndrome 12/15/2008    Priority: Medium  . OBESITY, MORBID 10/12/2008    Priority: Medium  . Hyperlipidemia 02/09/2008    Priority: Medium  . Bruxism 10/23/2015    Priority: Low  . Atypical chest pain 11/10/2014    Priority: Low  . Acne 05/12/2014    Priority: Low  . EUSTACHIAN TUBE DYSFUNCTION, CHRONIC 06/14/2010    Priority: Low  . Low back pain 08/11/2008    Priority: Low  . Allergic rhinitis 02/12/2007    Priority: Low  . ASTHMA 02/12/2007    Priority: Low  . Vitamin D deficiency 06/04/2018  . Hyponatremia 02/17/2018  . Blurry vision 08/13/2017    Medications- reviewed and updated Current Outpatient Medications  Medication Sig Dispense Refill  . aspirin EC 81 MG tablet Take 1 tablet (81 mg total) by mouth daily. 90 tablet 3  . atorvastatin (LIPITOR) 40 MG tablet TAKE 1 TABLET (40 MG TOTAL) BY MOUTH DAILY AT 6 PM. 90 tablet 3  . Biotin 5000 MCG TABS Take 5,000 mcg by mouth daily.     . calcium-vitamin D (OSCAL WITH D) 500-200 MG-UNIT per tablet Take 2 tablets by mouth daily.     . clopidogrel (PLAVIX) 75 MG tablet TAKE 1 TABLET BY MOUTH EVERY DAY 90 tablet 3  . fluticasone (FLONASE) 50 MCG/ACT nasal spray Place 2  sprays into both nostrils daily.     [name] glucose blood (ONETOUCH VERIO) test strip Use to test blood sugars daily. Dx: E11.9 100 each 0  . Insulin Pen Needle (BD PEN NEEDLE NANO 2ND GEN) 32G X 4 MM MISC 1 Package by Does not apply route 2 (two) times daily. 100 each 0  . ipratropium (ATROVENT) 0.03 % nasal spray Place 2 sprays into both nostrils every 12 (twelve) hours.    . Lancets (ONETOUCH ULTRASOFT) lancets Use to test blood sugars daily. Dx: E11.9 100 each 12  . loratadine (CLARITIN) 10 MG tablet Take 10 mg by mouth at  bedtime.     . metFORMIN (GLUCOPHAGE) 500 MG tablet TAKE 1 TABLET WITH BREAKFAST AND 1/2 TO 1 TABLET WITH DINNER. 180 tablet 1  . metoCLOPramide (REGLAN) 10 MG tablet TAKE 1/2 TABLET BY MOUTH 4 TIMES A DAY 60 tablet 5  . metoprolol tartrate (LOPRESSOR) 25 MG tablet TAKE 1/2 TAB BY MOUTH TWICE A DAY 30 tablet 5  . Multiple Vitamin (MULTIVITAMIN WITH MINERALS) TABS tablet Take 1 tablet by mouth daily.    [name] omeprazole (PRILOSEC OTC) 20 MG tablet Take 40 mg by mouth daily with breakfast.    . Probiotic Product (ALIGN) 4 MG CAPS Take 4 mg by mouth daily.     . psyllium (METAMUCIL SMOOTH TEXTURE) 28 % packet Take 1 packet by mouth daily before breakfast.     . venlafaxine XR (EFFEXOR-XR) 75 MG 24 hr capsule TAKE 1 CAPSULE (75 MG TOTAL) BY MOUTH DAILY WITH BREAKFAST. 90 capsule 2  . Vitamin D, Ergocalciferol, (DRISDOL) 1.25 MG (50000 UT) CAPS capsule Take 1 capsule (50,000 Units total) by mouth every 7 (seven) days. 4 capsule 0  . clindamycin (CLEOCIN) 300 MG capsule TAKE 2 CAPSULES BY MOUTH 1 HOUR PRIOR TOO DENTAL CLEANING. (Patient not taking: Reported on 09/21/2018) 2 capsule 0  . naproxen sodium (ALEVE) 220 MG tablet Take 220 mg by mouth.    . predniSONE (DELTASONE) 20 MG tablet Take 2 pills for 3 days, 1 pill for 4 days 10 tablet 0   No current facility-administered medications for this visit.      Objective:  Temp 98 F (36.7 C) (Oral)   Ht 5\' 5"  (1.651 m)   Wt 201 lb (91.2 kg)   LMP  (LMP Unknown)   BMI 33.45 kg/m  Gen: NAD, resting comfortably Points to her forehead as source of discomfort Lungs: nonlabored, normal respiratory rate  Skin: appears dry, no obvious rash    Assessment and Plan   # Frontal headache S: finished with antibiotics (10 days of cefdinir) on Monday but still waking up with sinus pressure and pain. 60% improvement. Headache persisted.Forehead is the most irritated area and is tender to touch even. No sinus discharge at present. Only coughs with nose spray. No  reported new vision issues.   She reports has had similar symptoms with sinus infections in the past- she has been treated with both prednisone and Depo-Medrol injections ( Palpitations on 60mg  prednisone in the past.) A/P: Frontal headache persist after treatment with Ceftin ear but is much improved-patient inquires about possibly using prednisone-I think this is reasonable but we discussed if she fails to have complete resolution to consider neuroimaging or CT of the sinuses depending on if she has improvement or not -Likely CT of the sinuses if improves but not full resolution - Likely MRI of the brain if no further improvement-given new headache pattern and patient over 50 -She  also would like to consider Depo-Medrol injection as potential treatment  Future Appointments  Date Time Provider [place]  10/14/2018 11:30 AM [name], PA-C [facility] None  12/31/2018 11:30 AM Parrett, Fonnie Mu, NP [facility] None  06/14/2019  3:00 PM [id number]  07/20/2019  1:30 PM Pieter Partridge, DO [facility] None   She will update me in a week if not improved-sooner if worsening  Lab/Order associations: Frontal headache  Meds ordered this encounter  Medications  . predniSONE (DELTASONE) 20 MG tablet    Sig: Take 2 pills for 3 days, 1 pill for 4 days    Dispense:  10 tablet    Refill:  0    Return precautions advised.  Garret Reddish, MD

## 2018-10-14 ENCOUNTER — Ambulatory Visit (INDEPENDENT_AMBULATORY_CARE_PROVIDER_SITE_OTHER): Payer: Medicare Other | Admitting: Physician Assistant

## 2018-10-14 ENCOUNTER — Other Ambulatory Visit: Payer: Self-pay

## 2018-10-14 ENCOUNTER — Encounter (INDEPENDENT_AMBULATORY_CARE_PROVIDER_SITE_OTHER): Payer: Self-pay | Admitting: Physician Assistant

## 2018-10-14 DIAGNOSIS — E669 Obesity, unspecified: Secondary | ICD-10-CM

## 2018-10-14 DIAGNOSIS — E559 Vitamin D deficiency, unspecified: Secondary | ICD-10-CM | POA: Diagnosis not present

## 2018-10-14 DIAGNOSIS — Z6833 Body mass index (BMI) 33.0-33.9, adult: Secondary | ICD-10-CM

## 2018-10-14 NOTE — Progress Notes (Signed)
Office: [phone]  /  Fax: [phone] TeleHealth Visit:  [name] has verbally consented to this TeleHealth visit today. The patient is located at home, the provider is located at the [facility] office. The participants in this visit include the listed provider and patient. The visit was conducted today via Webex.  HPI:   Chief Complaint: OBESITY [name] is here to discuss her progress with her obesity treatment plan. She is on the Category 3 plan and is following her eating plan approximately 90 % of the time. She states she is exercising 0 minutes 0 times per week. [name] reports that she continues to take prednisone due to a headache from a sinus infection. She has seen an increase in her appetite due to taking prednisone and is overeating her snack calories.  We were unable to weigh the patient today for this TeleHealth visit. She feels as if she has gained weight since her last visit. She has lost 18 lbs since starting treatment with [name].  Vitamin D deficiency [name] has a diagnosis of vitamin D deficiency. She is currently taking vit D and denies nausea, vomiting or muscle weakness.  ASSESSMENT AND PLAN:  Vitamin D deficiency  Class 1 obesity with serious comorbidity and body mass index (BMI) of 33.0 to 33.9 in adult, unspecified obesity type  PLAN:  Vitamin D Deficiency [name] was informed that low vitamin D levels contribute to fatigue and are associated with obesity, breast, and colon cancer. [name] agrees to continue to take prescription Vit D @50 ,000 IU every week and will follow up for routine testing of vitamin D, at least 2-3 times per year. She was informed of the risk of over-replacement of vitamin D and agrees to not increase her dose unless she discusses this with [name] first. [name] agrees to follow up in 3 weeks as directed.  Obesity [name] is currently in the action stage of change. As such, her goal is to  continue with weight loss efforts. She has agreed to follow the Category 3 plan. [name] has been instructed to work up to a goal of 150 minutes of combined cardio and strengthening exercise per week for weight loss and overall health benefits. We discussed the following Behavioral Modification Strategies today: work on meal planning and easy cooking plans and better snacking choices.  [name] has agreed to follow up with our clinic in 3 weeks. She was informed of the importance of frequent follow up visits to maximize her success with intensive lifestyle modifications for her multiple health conditions.  ALLERGIES: Allergies  Allergen Reactions  . Augmentin [Amoxicillin-Pot Clavulanate] Rash    rash  . Erythromycin Nausea Only  . Penicillins Itching and Rash    Has patient had a PCN reaction causing immediate rash, facial/tongue/throat swelling, SOB or lightheadedness with hypotension: No Has patient had a PCN reaction causing severe rash involving mucus membranes or skin necrosis: No Has patient had a PCN reaction that required hospitalization: No Has patient had a PCN reaction occurring within the last 10 years: No  If all of the above answers are "NO", then may proceed with Cephalosporin use.     MEDICATIONS: Current Outpatient Medications on File Prior to Visit  Medication Sig Dispense Refill  . aspirin EC 81 MG tablet Take 1 tablet (81 mg total) by mouth daily. 90 tablet 3  . atorvastatin (LIPITOR) 40 MG tablet TAKE 1 TABLET (40 MG TOTAL) BY MOUTH DAILY AT 6 PM. 90 tablet 3  . Biotin 5000  MCG TABS Take 5,000 mcg by mouth daily.     . calcium-vitamin D (OSCAL WITH D) 500-200 MG-UNIT per tablet Take 2 tablets by mouth daily.     . clindamycin (CLEOCIN) 300 MG capsule TAKE 2 CAPSULES BY MOUTH 1 HOUR PRIOR TOO DENTAL CLEANING. (Patient not taking: Reported on 09/21/2018) 2 capsule 0  . clopidogrel (PLAVIX) 75 MG tablet TAKE 1 TABLET BY MOUTH EVERY DAY 90 tablet 3  . fluticasone  (FLONASE) 50 MCG/ACT nasal spray Place 2 sprays into both nostrils daily.     [name] glucose blood (ONETOUCH VERIO) test strip Use to test blood sugars daily. Dx: E11.9 100 each 0  . Insulin Pen Needle (BD PEN NEEDLE NANO 2ND GEN) 32G X 4 MM MISC 1 Package by Does not apply route 2 (two) times daily. 100 each 0  . ipratropium (ATROVENT) 0.03 % nasal spray Place 2 sprays into both nostrils every 12 (twelve) hours.    . Lancets (ONETOUCH ULTRASOFT) lancets Use to test blood sugars daily. Dx: E11.9 100 each 12  . loratadine (CLARITIN) 10 MG tablet Take 10 mg by mouth at bedtime.     . metFORMIN (GLUCOPHAGE) 500 MG tablet TAKE 1 TABLET WITH BREAKFAST AND 1/2 TO 1 TABLET WITH DINNER. 180 tablet 1  . metoCLOPramide (REGLAN) 10 MG tablet TAKE 1/2 TABLET BY MOUTH 4 TIMES A DAY 60 tablet 5  . metoprolol tartrate (LOPRESSOR) 25 MG tablet TAKE 1/2 TAB BY MOUTH TWICE A DAY 30 tablet 5  . Multiple Vitamin (MULTIVITAMIN WITH MINERALS) TABS tablet Take 1 tablet by mouth daily.    . naproxen sodium (ALEVE) 220 MG tablet Take 220 mg by mouth.    [name] omeprazole (PRILOSEC OTC) 20 MG tablet Take 40 mg by mouth daily with breakfast.    . predniSONE (DELTASONE) 20 MG tablet Take 2 pills for 3 days, 1 pill for 4 days 10 tablet 0  . Probiotic Product (ALIGN) 4 MG CAPS Take 4 mg by mouth daily.     . psyllium (METAMUCIL SMOOTH TEXTURE) 28 % packet Take 1 packet by mouth daily before breakfast.     . venlafaxine XR (EFFEXOR-XR) 75 MG 24 hr capsule TAKE 1 CAPSULE (75 MG TOTAL) BY MOUTH DAILY WITH BREAKFAST. 90 capsule 2  . Vitamin D, Ergocalciferol, (DRISDOL) 1.25 MG (50000 UT) CAPS capsule Take 1 capsule (50,000 Units total) by mouth every 7 (seven) days. 4 capsule 0   No current facility-administered medications on file prior to visit.     PAST MEDICAL HISTORY: Past Medical History:  Diagnosis Date  . Allergy   . Anxiety   . Arthritis    back & knee  . Asthma     mild per pt shows up with resp illness  . Chronic  diastolic congestive heart failure ([place])   . Colon polyps   . Constipation   . Diabetes mellitus without complication ([place])   . Dyspnea   . Gastric polyps   . Gastroparesis   . GERD (gastroesophageal reflux disease)   . Headache    sinus headaches and migraines at times  . Heart murmur   . History of migraine headaches   . HTN (hypertension)   . Hyperlipidemia   . IBS (irritable bowel syndrome)   . Joint pain   . Lumbar disc disease   . S/P aortic valve replacement with bioprosthetic valve 08/23/2016   25 mm Edwards Intuity rapid-deployment bovine pericardial tissue valve via partial upper mini sternotomy  . Sleep apnea   .  TIA (transient ischemic attack)     PAST SURGICAL HISTORY: Past Surgical History:  Procedure Laterality Date  . ABDOMINAL HYSTERECTOMY    . AORTIC VALVE REPLACEMENT N/A 08/23/2016   Procedure: AORTIC VALVE REPLACEMENT (AVR) - using partial Upper Sternotomy- 55mm Edwards Intuity Aortic Valve used;  Surgeon: Rexene Alberts, MD;  Location: [place];  Service: Open Heart Surgery;  Laterality: N/A;  . BLADDER SUSPENSION  2007  . BREAST ENHANCEMENT SURGERY  1980  . CARPAL TUNNEL RELEASE Bilateral   . COLONOSCOPY    . DORSAL COMPARTMENT RELEASE Right 09/15/2014   Procedure: RIGHT WRIST DEQUERVAINS RELEASE ;  Surgeon: Kathryne Hitch, MD;  Location: [place];  Service: Orthopedics;  Laterality: Right;  . ESOPHAGOGASTRODUODENOSCOPY    . KNEE ARTHROSCOPY  04/15/2012   Procedure: ARTHROSCOPY KNEE;  Surgeon: [name], MD;  Location: [place];  Service: Orthopedics;  Laterality: Right;  . LAPAROSCOPIC CHOLECYSTECTOMY    . LASIK    . OOPHORECTOMY    . PALATE TO GINGIVA GRAFT  2017  . RIGHT/LEFT HEART CATH AND CORONARY ANGIOGRAPHY N/A 08/02/2016   Procedure: Right/Left Heart Cath and Coronary Angiography;  Surgeon: Sherren Mocha, MD;  Location: [facility];  Service: Cardiovascular;  Laterality: N/A;  . ROTATOR CUFF REPAIR Left   . TEE WITHOUT  CARDIOVERSION N/A 08/23/2016   Procedure: TRANSESOPHAGEAL ECHOCARDIOGRAM (TEE);  Surgeon: Rexene Alberts, MD;  Location: [facility];  Service: Open Heart Surgery;  Laterality: N/A;  . TOTAL KNEE ARTHROPLASTY  04/15/2012   Procedure: TOTAL KNEE ARTHROPLASTY;  Surgeon: [name], MD;  Location: [facility];  Service: Orthopedics;  Laterality: Right;  . TRIGGER FINGER RELEASE Right 04/20/2015   Procedure: RIGHT TRIGGER FINGER RELEASE (TENDON SHEALTH INCISION) ;  Surgeon: [name], MD;  Location: [facility];  Service: Orthopedics;  Laterality: Right;    SOCIAL HISTORY: Social History   Tobacco Use  . Smoking status: Never Smoker  . Smokeless tobacco: Never Used  Substance Use Topics  . Alcohol use: No  . Drug use: No    FAMILY HISTORY: Family History  Problem Relation Age of Onset  . COPD Mother   . Colon polyps Mother   . Irritable bowel syndrome Mother   . Anxiety disorder Mother   . Heart disease Father   . Alcohol abuse Father   . Colon polyps Maternal Aunt     ROS: Review of Systems  Gastrointestinal: Negative for nausea and vomiting.  Musculoskeletal:       Negative for muscle weakness.    PHYSICAL EXAM: Pt in no acute distress  RECENT LABS AND TESTS: BMET    Component Value Date/Time   NA 131 (L) 06/04/2018 1012   NA 129 (L) 01/19/2018 0959   K 4.6 06/04/2018 1012   CL 97 06/04/2018 1012   CO2 27 06/04/2018 1012   GLUCOSE 109 (H) 06/04/2018 1012   BUN 18 06/04/2018 1012   BUN 16 01/19/2018 0959   CREATININE 0.61 06/04/2018 1012   CALCIUM 9.1 06/04/2018 1012   GFRNONAA 79 01/19/2018 0959   GFRAA 91 01/19/2018 0959   Lab Results  Component Value Date   HGBA1C 6.1 06/04/2018   HGBA1C 6.6 (H) 01/19/2018   HGBA1C 6.2 (A) 11/25/2017   HGBA1C 6.4 05/23/2017   HGBA1C 6.7 (H) 01/02/2017   Lab Results  Component Value Date   INSULIN 12.1 01/19/2018   CBC    Component Value Date/Time   WBC 8.8 06/04/2018 1012  RBC 4.51 06/04/2018 1012    HGB 11.8 (L) 06/04/2018 1012   HGB 11.7 01/19/2018 0959   HCT 36.3 06/04/2018 1012   HCT 35.7 01/19/2018 0959   PLT 346.0 06/04/2018 1012   PLT 358 04/24/2017 1231   MCV 80.5 06/04/2018 1012   MCV 81 01/19/2018 0959   MCH 26.5 (L) 01/19/2018 0959   MCH 27.4 08/26/2016 0248   MCHC 32.5 06/04/2018 1012   RDW 15.7 (H) 06/04/2018 1012   RDW 14.9 01/19/2018 0959   LYMPHSABS 2.9 01/19/2018 0959   MONOABS 0.6 05/11/2015 0836   EOSABS 0.1 01/19/2018 0959   BASOSABS 0.0 01/19/2018 0959   Iron/TIBC/Ferritin/ %Sat No results found for: IRON, TIBC, FERRITIN, IRONPCTSAT Lipid Panel     Component Value Date/Time   CHOL 116 06/04/2018 1012   CHOL 123 01/19/2018 0959   TRIG 70.0 06/04/2018 1012   HDL 47.40 06/04/2018 1012   HDL 54 01/19/2018 0959   CHOLHDL 2 06/04/2018 1012   VLDL 14.0 06/04/2018 1012   LDLCALC 54 06/04/2018 1012   LDLCALC 54 01/19/2018 0959   LDLDIRECT 58.0 11/25/2017 1156   Hepatic Function Panel     Component Value Date/Time   PROT 6.5 06/04/2018 1012   PROT 6.6 01/19/2018 0959   ALBUMIN 4.4 06/04/2018 1012   ALBUMIN 4.6 01/19/2018 0959   AST 22 06/04/2018 1012   ALT 15 06/04/2018 1012   ALKPHOS 151 (H) 06/04/2018 1012   BILITOT 0.4 06/04/2018 1012   BILITOT 0.5 01/19/2018 0959   BILIDIR 0.1 05/11/2015 0836      Component Value Date/Time   TSH 3.38 06/04/2018 1012   TSH 5.300 (H) 01/19/2018 0959   TSH 3.390 04/24/2017 1231   Results for AMILAH, GREENSPAN (MRN [id number]) as of 10/14/2018 12:56  Ref. Range 06/04/2018 10:12  VITD Latest Ref Range: 30.00 - 100.00 ng/mL 42.08    I, Marcille Blanco, CMA, am acting as transcriptionist for [name], PA-C I, [name], PA-C have reviewed above note and agree with its content

## 2018-11-02 ENCOUNTER — Encounter (INDEPENDENT_AMBULATORY_CARE_PROVIDER_SITE_OTHER): Payer: Self-pay | Admitting: Physician Assistant

## 2018-11-02 ENCOUNTER — Ambulatory Visit (INDEPENDENT_AMBULATORY_CARE_PROVIDER_SITE_OTHER): Payer: Medicare Other | Admitting: Physician Assistant

## 2018-11-02 ENCOUNTER — Other Ambulatory Visit: Payer: Self-pay

## 2018-11-02 DIAGNOSIS — Z6833 Body mass index (BMI) 33.0-33.9, adult: Secondary | ICD-10-CM | POA: Diagnosis not present

## 2018-11-02 DIAGNOSIS — E7849 Other hyperlipidemia: Secondary | ICD-10-CM | POA: Diagnosis not present

## 2018-11-02 DIAGNOSIS — E559 Vitamin D deficiency, unspecified: Secondary | ICD-10-CM | POA: Diagnosis not present

## 2018-11-02 DIAGNOSIS — E669 Obesity, unspecified: Secondary | ICD-10-CM | POA: Diagnosis not present

## 2018-11-02 MED ORDER — VITAMIN D (ERGOCALCIFEROL) 1.25 MG (50000 UNIT) PO CAPS: 50000.0000 [IU] | ORAL_CAPSULE | ORAL | 0 refills | Status: DC

## 2018-11-02 NOTE — Progress Notes (Signed)
Office: [phone]  /  Fax: [phone] TeleHealth Visit:  [name] has verbally consented to this TeleHealth visit today. The patient is located at home, the provider is located at the [facility] office. The participants in this visit include the listed provider and patient. The visit was conducted today via FaceTime.  HPI:   Chief Complaint: OBESITY [name] is here to discuss her progress with her obesity treatment plan. She is on the Category 3 plan and is following her eating plan approximately 90% of the time. She states she is walking 1 mile 4 times per week. [name] reports that she has been hungry around 3:30-4:00 p.m. and has been snacking on crackers and peanut butter. She states that she is overeating her snack calories.  We were unable to weigh the patient today for this TeleHealth visit. She feels as if she has gained 2 lbs since her last visit. She has lost 18 lbs since starting treatment with [name].  Vitamin D deficiency [name] has a diagnosis of Vitamin D deficiency. She is currently taking prescription Vit D and denies nausea, vomiting or muscle weakness.  Hyperlipidemia [name] has hyperlipidemia and has been trying to improve her cholesterol levels with intensive lifestyle modification including a low saturated fat diet, exercise and weight loss. She is on atorvastatin and denies any chest pain.  ASSESSMENT AND PLAN:  Vitamin D deficiency - Plan: Vitamin D, Ergocalciferol, (DRISDOL) 1.25 MG (50000 UT) CAPS capsule  Other hyperlipidemia  Class 1 obesity with serious comorbidity and body mass index (BMI) of 33.0 to 33.9 in adult, unspecified obesity type  PLAN:  Vitamin D Deficiency [name] was informed that low Vitamin D levels contributes to fatigue and are associated with obesity, breast, and colon cancer. She agrees to continue to take prescription Vit D @ 50,000 IU every week #4 with 0 refills and will follow-up for  routine testing of Vitamin D, at least 2-3 times per year. She was informed of the risk of over-replacement of Vitamin D and agrees to not increase her dose unless she discusses this with [name] first. [name] agrees to follow-up with our clinic in 2 weeks.  Hyperlipidemia [name] was informed of the American Heart Association Guidelines emphasizing intensive lifestyle modifications as the first line treatment for hyperlipidemia. We discussed many lifestyle modifications today in depth, and Arneta will continue to work on decreasing saturated fats such as fatty red meat, butter and many fried foods. She will continue atorvastatin, increase vegetables and lean protein in her diet, and continue to work on exercise and weight loss efforts.  Obesity [name] is currently in the action stage of change. As such, her goal is to continue with weight loss efforts. She has agreed to follow the Category 3 plan. [name] has been instructed to work up to a goal of 150 minutes of combined cardio and strengthening exercise per week for weight loss and overall health benefits. We discussed the following Behavioral Modification Strategies today: increasing lean protein intake, work on meal planning and easy cooking plan, and better snacking choices.  [name] has agreed to follow-up with our clinic in 2 weeks. She was informed of the importance of frequent follow-up visits to maximize her success with intensive lifestyle modifications for her multiple health conditions.  ALLERGIES: Allergies  Allergen Reactions  . Augmentin [Amoxicillin-Pot Clavulanate] Rash    rash  . Erythromycin Nausea Only  . Penicillins Itching and Rash    Has patient had a PCN reaction causing immediate rash,  facial/tongue/throat swelling, SOB or lightheadedness with hypotension: No Has patient had a PCN reaction causing severe rash involving mucus membranes or skin necrosis: No Has patient had a PCN reaction that required  hospitalization: No Has patient had a PCN reaction occurring within the last 10 years: No  If all of the above answers are "NO", then may proceed with Cephalosporin use.     MEDICATIONS: Current Outpatient Medications on File Prior to Visit  Medication Sig Dispense Refill  . aspirin EC 81 MG tablet Take 1 tablet (81 mg total) by mouth daily. 90 tablet 3  . atorvastatin (LIPITOR) 40 MG tablet TAKE 1 TABLET (40 MG TOTAL) BY MOUTH DAILY AT 6 PM. 90 tablet 3  . Biotin 5000 MCG TABS Take 5,000 mcg by mouth daily.     . calcium-vitamin D (OSCAL WITH D) 500-200 MG-UNIT per tablet Take 2 tablets by mouth daily.     . clindamycin (CLEOCIN) 300 MG capsule TAKE 2 CAPSULES BY MOUTH 1 HOUR PRIOR TOO DENTAL CLEANING. (Patient not taking: Reported on 09/21/2018) 2 capsule 0  . clopidogrel (PLAVIX) 75 MG tablet TAKE 1 TABLET BY MOUTH EVERY DAY 90 tablet 3  . fluticasone (FLONASE) 50 MCG/ACT nasal spray Place 2 sprays into both nostrils daily.     [name] glucose blood (ONETOUCH VERIO) test strip Use to test blood sugars daily. Dx: E11.9 100 each 0  . Insulin Pen Needle (BD PEN NEEDLE NANO 2ND GEN) 32G X 4 MM MISC 1 Package by Does not apply route 2 (two) times daily. 100 each 0  . ipratropium (ATROVENT) 0.03 % nasal spray Place 2 sprays into both nostrils every 12 (twelve) hours.    . Lancets (ONETOUCH ULTRASOFT) lancets Use to test blood sugars daily. Dx: E11.9 100 each 12  . loratadine (CLARITIN) 10 MG tablet Take 10 mg by mouth at bedtime.     . metFORMIN (GLUCOPHAGE) 500 MG tablet TAKE 1 TABLET WITH BREAKFAST AND 1/2 TO 1 TABLET WITH DINNER. 180 tablet 1  . metoCLOPramide (REGLAN) 10 MG tablet TAKE 1/2 TABLET BY MOUTH 4 TIMES A DAY 60 tablet 5  . metoprolol tartrate (LOPRESSOR) 25 MG tablet TAKE 1/2 TAB BY MOUTH TWICE A DAY 30 tablet 5  . Multiple Vitamin (MULTIVITAMIN WITH MINERALS) TABS tablet Take 1 tablet by mouth daily.    . naproxen sodium (ALEVE) 220 MG tablet Take 220 mg by mouth.    [name] omeprazole  (PRILOSEC OTC) 20 MG tablet Take 40 mg by mouth daily with breakfast.    . predniSONE (DELTASONE) 20 MG tablet Take 2 pills for 3 days, 1 pill for 4 days 10 tablet 0  . Probiotic Product (ALIGN) 4 MG CAPS Take 4 mg by mouth daily.     . psyllium (METAMUCIL SMOOTH TEXTURE) 28 % packet Take 1 packet by mouth daily before breakfast.     . venlafaxine XR (EFFEXOR-XR) 75 MG 24 hr capsule TAKE 1 CAPSULE (75 MG TOTAL) BY MOUTH DAILY WITH BREAKFAST. 90 capsule 2   No current facility-administered medications on file prior to visit.     PAST MEDICAL HISTORY: Past Medical History:  Diagnosis Date  . Allergy   . Anxiety   . Arthritis    back & knee  . Asthma     mild per pt shows up with resp illness  . Chronic diastolic congestive heart failure ([place])   . Colon polyps   . Constipation   . Diabetes mellitus without complication ([place])   . Dyspnea   .  Gastric polyps   . Gastroparesis   . GERD (gastroesophageal reflux disease)   . Headache    sinus headaches and migraines at times  . Heart murmur   . History of migraine headaches   . HTN (hypertension)   . Hyperlipidemia   . IBS (irritable bowel syndrome)   . Joint pain   . Lumbar disc disease   . S/P aortic valve replacement with bioprosthetic valve 08/23/2016   25 mm Edwards Intuity rapid-deployment bovine pericardial tissue valve via partial upper mini sternotomy  . Sleep apnea   . TIA (transient ischemic attack)     PAST SURGICAL HISTORY: Past Surgical History:  Procedure Laterality Date  . ABDOMINAL HYSTERECTOMY    . AORTIC VALVE REPLACEMENT N/A 08/23/2016   Procedure: AORTIC VALVE REPLACEMENT (AVR) - using partial Upper Sternotomy- 65mm Edwards Intuity Aortic Valve used;  Surgeon: Rexene Alberts, MD;  Location: [facility];  Service: Open Heart Surgery;  Laterality: N/A;  . BLADDER SUSPENSION  2007  . BREAST ENHANCEMENT SURGERY  1980  . CARPAL TUNNEL RELEASE Bilateral   . COLONOSCOPY    . DORSAL COMPARTMENT RELEASE Right 09/15/2014    Procedure: RIGHT WRIST DEQUERVAINS RELEASE ;  Surgeon: Kathryne Hitch, MD;  Location: [facility];  Service: Orthopedics;  Laterality: Right;  . ESOPHAGOGASTRODUODENOSCOPY    . KNEE ARTHROSCOPY  04/15/2012   Procedure: ARTHROSCOPY KNEE;  Surgeon: [name], MD;  Location: [facility];  Service: Orthopedics;  Laterality: Right;  . LAPAROSCOPIC CHOLECYSTECTOMY    . LASIK    . OOPHORECTOMY    . PALATE TO GINGIVA GRAFT  2017  . RIGHT/LEFT HEART CATH AND CORONARY ANGIOGRAPHY N/A 08/02/2016   Procedure: Right/Left Heart Cath and Coronary Angiography;  Surgeon: Sherren Mocha, MD;  Location: [facility];  Service: Cardiovascular;  Laterality: N/A;  . ROTATOR CUFF REPAIR Left   . TEE WITHOUT CARDIOVERSION N/A 08/23/2016   Procedure: TRANSESOPHAGEAL ECHOCARDIOGRAM (TEE);  Surgeon: Rexene Alberts, MD;  Location: [facility];  Service: Open Heart Surgery;  Laterality: N/A;  . TOTAL KNEE ARTHROPLASTY  04/15/2012   Procedure: TOTAL KNEE ARTHROPLASTY;  Surgeon: [name], MD;  Location: [facility];  Service: Orthopedics;  Laterality: Right;  . TRIGGER FINGER RELEASE Right 04/20/2015   Procedure: RIGHT TRIGGER FINGER RELEASE (TENDON SHEALTH INCISION) ;  Surgeon: [name], MD;  Location: [facility];  Service: Orthopedics;  Laterality: Right;    SOCIAL HISTORY: Social History   Tobacco Use  . Smoking status: Never Smoker  . Smokeless tobacco: Never Used  Substance Use Topics  . Alcohol use: No  . Drug use: No    FAMILY HISTORY: Family History  Problem Relation Age of Onset  . COPD Mother   . Colon polyps Mother   . Irritable bowel syndrome Mother   . Anxiety disorder Mother   . Heart disease Father   . Alcohol abuse Father   . Colon polyps Maternal Aunt    ROS: Review of Systems  Cardiovascular: Negative for chest pain.  Gastrointestinal: Negative for nausea and vomiting.  Musculoskeletal:       Negative for muscle weakness.   PHYSICAL EXAM: Pt in no  acute distress  RECENT LABS AND TESTS: BMET    Component Value Date/Time   NA 131 (L) 06/04/2018 1012   NA 129 (L) 01/19/2018 0959   K 4.6 06/04/2018 1012   CL 97 06/04/2018 1012   CO2 27 06/04/2018 1012   GLUCOSE 109 (  H) 06/04/2018 1012   BUN 18 06/04/2018 1012   BUN 16 01/19/2018 0959   CREATININE 0.61 06/04/2018 1012   CALCIUM 9.1 06/04/2018 1012   GFRNONAA 79 01/19/2018 0959   GFRAA 91 01/19/2018 0959   Lab Results  Component Value Date   HGBA1C 6.1 06/04/2018   HGBA1C 6.6 (H) 01/19/2018   HGBA1C 6.2 (A) 11/25/2017   HGBA1C 6.4 05/23/2017   HGBA1C 6.7 (H) 01/02/2017   Lab Results  Component Value Date   INSULIN 12.1 01/19/2018   CBC    Component Value Date/Time   WBC 8.8 06/04/2018 1012   RBC 4.51 06/04/2018 1012   HGB 11.8 (L) 06/04/2018 1012   HGB 11.7 01/19/2018 0959   HCT 36.3 06/04/2018 1012   HCT 35.7 01/19/2018 0959   PLT 346.0 06/04/2018 1012   PLT 358 04/24/2017 1231   MCV 80.5 06/04/2018 1012   MCV 81 01/19/2018 0959   MCH 26.5 (L) 01/19/2018 0959   MCH 27.4 08/26/2016 0248   MCHC 32.5 06/04/2018 1012   RDW 15.7 (H) 06/04/2018 1012   RDW 14.9 01/19/2018 0959   LYMPHSABS 2.9 01/19/2018 0959   MONOABS 0.6 05/11/2015 0836   EOSABS 0.1 01/19/2018 0959   BASOSABS 0.0 01/19/2018 0959   Iron/TIBC/Ferritin/ %Sat No results found for: IRON, TIBC, FERRITIN, IRONPCTSAT Lipid Panel     Component Value Date/Time   CHOL 116 06/04/2018 1012   CHOL 123 01/19/2018 0959   TRIG 70.0 06/04/2018 1012   HDL 47.40 06/04/2018 1012   HDL 54 01/19/2018 0959   CHOLHDL 2 06/04/2018 1012   VLDL 14.0 06/04/2018 1012   LDLCALC 54 06/04/2018 1012   LDLCALC 54 01/19/2018 0959   LDLDIRECT 58.0 11/25/2017 1156   Hepatic Function Panel     Component Value Date/Time   PROT 6.5 06/04/2018 1012   PROT 6.6 01/19/2018 0959   ALBUMIN 4.4 06/04/2018 1012   ALBUMIN 4.6 01/19/2018 0959   AST 22 06/04/2018 1012   ALT 15 06/04/2018 1012   ALKPHOS 151 (H) 06/04/2018 1012    BILITOT 0.4 06/04/2018 1012   BILITOT 0.5 01/19/2018 0959   BILIDIR 0.1 05/11/2015 0836      Component Value Date/Time   TSH 3.38 06/04/2018 1012   TSH 5.300 (H) 01/19/2018 0959   TSH 3.390 04/24/2017 1231   Results for ELVI, LEVENTHAL (MRN [id number]) as of 11/02/2018 15:04  Ref. Range 01/19/2018 09:59  Vitamin D, 25-Hydroxy Latest Ref Range: 30.0 - 100.0 ng/mL 27.9 (L)   I, [name], am acting as Location manager for [employer], PA-C I, [name], PA-C have reviewed above note and agree with its content

## 2018-11-09 ENCOUNTER — Encounter (INDEPENDENT_AMBULATORY_CARE_PROVIDER_SITE_OTHER): Payer: Self-pay | Admitting: Physician Assistant

## 2018-11-18 ENCOUNTER — Other Ambulatory Visit: Payer: Self-pay

## 2018-11-18 ENCOUNTER — Ambulatory Visit (INDEPENDENT_AMBULATORY_CARE_PROVIDER_SITE_OTHER): Payer: Medicare Other | Admitting: Physician Assistant

## 2018-11-18 DIAGNOSIS — E669 Obesity, unspecified: Secondary | ICD-10-CM | POA: Diagnosis not present

## 2018-11-18 DIAGNOSIS — Z6833 Body mass index (BMI) 33.0-33.9, adult: Secondary | ICD-10-CM

## 2018-11-18 DIAGNOSIS — E119 Type 2 diabetes mellitus without complications: Secondary | ICD-10-CM

## 2018-11-18 NOTE — Progress Notes (Signed)
Office: [phone]  /  Fax: [phone] TeleHealth Visit:  [name] has verbally consented to this TeleHealth visit today. The patient is located at home, the provider is located at the [facility] office. The participants in this visit include the listed provider and patient. The visit was conducted today via webex.  HPI:   Chief Complaint: OBESITY [name] is here to discuss her progress with her obesity treatment plan. She is on the Category 3 plan and is following her eating plan approximately 90 % of the time. She states she is exercising 0 minutes 0 times per week. Kagan reports her weight today to be 205 lbs. She continues to be hungry around 4-5 pm and is eating peanut butter crackers or ice cream bar. She eats her dinner portions for lunch.  We were unable to weigh the patient today for this TeleHealth visit. She is unsure if she has lost or gained weight since her last visit. She has lost 18 lbs since starting treatment with [name].  Diabetes II [name] has a diagnosis of diabetes type II. [name] states her average fasting BGs range between 113 and 120. She is on metformin and denies nausea, vomiting, or diarrhea. Last A1c was 6.1. She denies polyphagia or hypoglycemia. She has been working on intensive lifestyle modifications including diet, exercise, and weight loss to help control her blood glucose levels.  ASSESSMENT AND PLAN:  Type 2 diabetes mellitus without complication, without long-term current use of insulin (HCC)  Class 1 obesity with serious comorbidity and body mass index (BMI) of 33.0 to 33.9 in adult, unspecified obesity type  PLAN:  Diabetes II [name] has been given extensive diabetes education by myself today including ideal fasting and post-prandial blood glucose readings, individual ideal Hgb A1c goals and hypoglycemia prevention. We discussed the importance of good blood sugar control to decrease the likelihood of  diabetic complications such as nephropathy, neuropathy, limb loss, blindness, coronary artery disease, and death. We discussed the importance of intensive lifestyle modification including diet, exercise and weight loss as the first line treatment for diabetes. [name] agrees to continue taking metformin and weight loss. [name] agrees to follow up with our clinic in 2 weeks.  Obesity [name] is currently in the action stage of change. As such, her goal is to continue with weight loss efforts She has agreed to follow the Category 3 plan [name] has been instructed to work up to a goal of 150 minutes of combined cardio and strengthening exercise per week for weight loss and overall health benefits. We discussed the following Behavioral Modification Strategies today: work on meal planning and easy cooking plans and keeping healthy foods in the home   [name] has agreed to follow up with our clinic in 2 weeks. She was informed of the importance of frequent follow up visits to maximize her success with intensive lifestyle modifications for her multiple health conditions.  I spent > than 50% of the 25 minute visit on counseling as documented in the note.   ALLERGIES: Allergies  Allergen Reactions  . Augmentin [Amoxicillin-Pot Clavulanate] Rash    rash  . Erythromycin Nausea Only  . Penicillins Itching and Rash    Has patient had a PCN reaction causing immediate rash, facial/tongue/throat swelling, SOB or lightheadedness with hypotension: No Has patient had a PCN reaction causing severe rash involving mucus membranes or skin necrosis: No Has patient had a PCN reaction that required hospitalization: No Has patient had a PCN reaction occurring within the  last 10 years: No  If all of the above answers are "NO", then may proceed with Cephalosporin use.     MEDICATIONS: Current Outpatient Medications on File Prior to Visit  Medication Sig Dispense Refill  . aspirin EC 81 MG tablet  Take 1 tablet (81 mg total) by mouth daily. 90 tablet 3  . atorvastatin (LIPITOR) 40 MG tablet TAKE 1 TABLET (40 MG TOTAL) BY MOUTH DAILY AT 6 PM. 90 tablet 3  . Biotin 5000 MCG TABS Take 5,000 mcg by mouth daily.     . calcium-vitamin D (OSCAL WITH D) 500-200 MG-UNIT per tablet Take 2 tablets by mouth daily.     . clindamycin (CLEOCIN) 300 MG capsule TAKE 2 CAPSULES BY MOUTH 1 HOUR PRIOR TOO DENTAL CLEANING. (Patient not taking: Reported on 09/21/2018) 2 capsule 0  . clopidogrel (PLAVIX) 75 MG tablet TAKE 1 TABLET BY MOUTH EVERY DAY 90 tablet 3  . fluticasone (FLONASE) 50 MCG/ACT nasal spray Place 2 sprays into both nostrils daily.     [name] glucose blood (ONETOUCH VERIO) test strip Use to test blood sugars daily. Dx: E11.9 100 each 0  . Insulin Pen Needle (BD PEN NEEDLE NANO 2ND GEN) 32G X 4 MM MISC 1 Package by Does not apply route 2 (two) times daily. 100 each 0  . ipratropium (ATROVENT) 0.03 % nasal spray Place 2 sprays into both nostrils every 12 (twelve) hours.    . Lancets (ONETOUCH ULTRASOFT) lancets Use to test blood sugars daily. Dx: E11.9 100 each 12  . loratadine (CLARITIN) 10 MG tablet Take 10 mg by mouth at bedtime.     . metFORMIN (GLUCOPHAGE) 500 MG tablet TAKE 1 TABLET WITH BREAKFAST AND 1/2 TO 1 TABLET WITH DINNER. 180 tablet 1  . metoCLOPramide (REGLAN) 10 MG tablet TAKE 1/2 TABLET BY MOUTH 4 TIMES A DAY 60 tablet 5  . metoprolol tartrate (LOPRESSOR) 25 MG tablet TAKE 1/2 TAB BY MOUTH TWICE A DAY 30 tablet 5  . Multiple Vitamin (MULTIVITAMIN WITH MINERALS) TABS tablet Take 1 tablet by mouth daily.    . naproxen sodium (ALEVE) 220 MG tablet Take 220 mg by mouth.    [name] omeprazole (PRILOSEC OTC) 20 MG tablet Take 40 mg by mouth daily with breakfast.    . Probiotic Product (ALIGN) 4 MG CAPS Take 4 mg by mouth daily.     . psyllium (METAMUCIL SMOOTH TEXTURE) 28 % packet Take 1 packet by mouth daily before breakfast.     . venlafaxine XR (EFFEXOR-XR) 75 MG 24 hr capsule TAKE 1 CAPSULE (75  MG TOTAL) BY MOUTH DAILY WITH BREAKFAST. 90 capsule 2  . Vitamin D, Ergocalciferol, (DRISDOL) 1.25 MG (50000 UT) CAPS capsule Take 1 capsule (50,000 Units total) by mouth every 7 (seven) days. 4 capsule 0   No current facility-administered medications on file prior to visit.     PAST MEDICAL HISTORY: Past Medical History:  Diagnosis Date  . Allergy   . Anxiety   . Arthritis    back & knee  . Asthma     mild per pt shows up with resp illness  . Chronic diastolic congestive heart failure ([place])   . Colon polyps   . Constipation   . Diabetes mellitus without complication ([place])   . Dyspnea   . Gastric polyps   . Gastroparesis   . GERD (gastroesophageal reflux disease)   . Headache    sinus headaches and migraines at times  . Heart murmur   . History  of migraine headaches   . HTN (hypertension)   . Hyperlipidemia   . IBS (irritable bowel syndrome)   . Joint pain   . Lumbar disc disease   . S/P aortic valve replacement with bioprosthetic valve 08/23/2016   25 mm Edwards Intuity rapid-deployment bovine pericardial tissue valve via partial upper mini sternotomy  . Sleep apnea   . TIA (transient ischemic attack)     PAST SURGICAL HISTORY: Past Surgical History:  Procedure Laterality Date  . ABDOMINAL HYSTERECTOMY    . AORTIC VALVE REPLACEMENT N/A 08/23/2016   Procedure: AORTIC VALVE REPLACEMENT (AVR) - using partial Upper Sternotomy- 72mm Edwards Intuity Aortic Valve used;  Surgeon: Rexene Alberts, MD;  Location: [place];  Service: Open Heart Surgery;  Laterality: N/A;  . BLADDER SUSPENSION  2007  . BREAST ENHANCEMENT SURGERY  1980  . CARPAL TUNNEL RELEASE Bilateral   . COLONOSCOPY    . DORSAL COMPARTMENT RELEASE Right 09/15/2014   Procedure: RIGHT WRIST DEQUERVAINS RELEASE ;  Surgeon: Kathryne Hitch, MD;  Location: [place];  Service: Orthopedics;  Laterality: Right;  . ESOPHAGOGASTRODUODENOSCOPY    . KNEE ARTHROSCOPY  04/15/2012   Procedure: ARTHROSCOPY KNEE;   Surgeon: [name], MD;  Location: [facility];  Service: Orthopedics;  Laterality: Right;  . LAPAROSCOPIC CHOLECYSTECTOMY    . LASIK    . OOPHORECTOMY    . PALATE TO GINGIVA GRAFT  2017  . RIGHT/LEFT HEART CATH AND CORONARY ANGIOGRAPHY N/A 08/02/2016   Procedure: Right/Left Heart Cath and Coronary Angiography;  Surgeon: Sherren Mocha, MD;  Location: [place];  Service: Cardiovascular;  Laterality: N/A;  . ROTATOR CUFF REPAIR Left   . TEE WITHOUT CARDIOVERSION N/A 08/23/2016   Procedure: TRANSESOPHAGEAL ECHOCARDIOGRAM (TEE);  Surgeon: Rexene Alberts, MD;  Location: [facility];  Service: Open Heart Surgery;  Laterality: N/A;  . TOTAL KNEE ARTHROPLASTY  04/15/2012   Procedure: TOTAL KNEE ARTHROPLASTY;  Surgeon: [name], MD;  Location: [facility];  Service: Orthopedics;  Laterality: Right;  . TRIGGER FINGER RELEASE Right 04/20/2015   Procedure: RIGHT TRIGGER FINGER RELEASE (TENDON SHEALTH INCISION) ;  Surgeon: [name], MD;  Location: [facility];  Service: Orthopedics;  Laterality: Right;    SOCIAL HISTORY: Social History   Tobacco Use  . Smoking status: Never Smoker  . Smokeless tobacco: Never Used  Substance Use Topics  . Alcohol use: No  . Drug use: No    FAMILY HISTORY: Family History  Problem Relation Age of Onset  . COPD Mother   . Colon polyps Mother   . Irritable bowel syndrome Mother   . Anxiety disorder Mother   . Heart disease Father   . Alcohol abuse Father   . Colon polyps Maternal Aunt     ROS: Review of Systems  Constitutional: Negative for weight loss.  Gastrointestinal: Negative for diarrhea, nausea and vomiting.  Endo/Heme/Allergies:       Negative polyphagia Negative hypoglycemia    PHYSICAL EXAM: Pt in no acute distress  RECENT LABS AND TESTS: BMET    Component Value Date/Time   NA 131 (L) 06/04/2018 1012   NA 129 (L) 01/19/2018 0959   K 4.6 06/04/2018 1012   CL 97 06/04/2018 1012   CO2 27 06/04/2018 1012    GLUCOSE 109 (H) 06/04/2018 1012   BUN 18 06/04/2018 1012   BUN 16 01/19/2018 0959   CREATININE 0.61 06/04/2018 1012   CALCIUM 9.1 06/04/2018 1012   GFRNONAA 79  01/19/2018 0959   GFRAA 91 01/19/2018 0959   Lab Results  Component Value Date   HGBA1C 6.1 06/04/2018   HGBA1C 6.6 (H) 01/19/2018   HGBA1C 6.2 (A) 11/25/2017   HGBA1C 6.4 05/23/2017   HGBA1C 6.7 (H) 01/02/2017   Lab Results  Component Value Date   INSULIN 12.1 01/19/2018   CBC    Component Value Date/Time   WBC 8.8 06/04/2018 1012   RBC 4.51 06/04/2018 1012   HGB 11.8 (L) 06/04/2018 1012   HGB 11.7 01/19/2018 0959   HCT 36.3 06/04/2018 1012   HCT 35.7 01/19/2018 0959   PLT 346.0 06/04/2018 1012   PLT 358 04/24/2017 1231   MCV 80.5 06/04/2018 1012   MCV 81 01/19/2018 0959   MCH 26.5 (L) 01/19/2018 0959   MCH 27.4 08/26/2016 0248   MCHC 32.5 06/04/2018 1012   RDW 15.7 (H) 06/04/2018 1012   RDW 14.9 01/19/2018 0959   LYMPHSABS 2.9 01/19/2018 0959   MONOABS 0.6 05/11/2015 0836   EOSABS 0.1 01/19/2018 0959   BASOSABS 0.0 01/19/2018 0959   Iron/TIBC/Ferritin/ %Sat No results found for: IRON, TIBC, FERRITIN, IRONPCTSAT Lipid Panel     Component Value Date/Time   CHOL 116 06/04/2018 1012   CHOL 123 01/19/2018 0959   TRIG 70.0 06/04/2018 1012   HDL 47.40 06/04/2018 1012   HDL 54 01/19/2018 0959   CHOLHDL 2 06/04/2018 1012   VLDL 14.0 06/04/2018 1012   LDLCALC 54 06/04/2018 1012   LDLCALC 54 01/19/2018 0959   LDLDIRECT 58.0 11/25/2017 1156   Hepatic Function Panel     Component Value Date/Time   PROT 6.5 06/04/2018 1012   PROT 6.6 01/19/2018 0959   ALBUMIN 4.4 06/04/2018 1012   ALBUMIN 4.6 01/19/2018 0959   AST 22 06/04/2018 1012   ALT 15 06/04/2018 1012   ALKPHOS 151 (H) 06/04/2018 1012   BILITOT 0.4 06/04/2018 1012   BILITOT 0.5 01/19/2018 0959   BILIDIR 0.1 05/11/2015 0836      Component Value Date/Time   TSH 3.38 06/04/2018 1012   TSH 5.300 (H) 01/19/2018 0959   TSH 3.390 04/24/2017 1231       I, Trixie Dredge, am acting as transcriptionist for [name], PA-C0 I, [name], PA-C have reviewed above note and agree with its content

## 2018-11-19 ENCOUNTER — Other Ambulatory Visit: Payer: Self-pay | Admitting: Cardiology

## 2018-11-19 DIAGNOSIS — Z953 Presence of xenogenic heart valve: Secondary | ICD-10-CM

## 2018-11-23 ENCOUNTER — Other Ambulatory Visit (INDEPENDENT_AMBULATORY_CARE_PROVIDER_SITE_OTHER): Payer: Self-pay | Admitting: Physician Assistant

## 2018-11-23 DIAGNOSIS — E559 Vitamin D deficiency, unspecified: Secondary | ICD-10-CM

## 2018-11-25 ENCOUNTER — Other Ambulatory Visit: Payer: Self-pay

## 2018-11-25 MED ORDER — METFORMIN HCL 500 MG PO TABS: ORAL_TABLET | ORAL | 1 refills | Status: DC

## 2018-12-01 ENCOUNTER — Encounter (INDEPENDENT_AMBULATORY_CARE_PROVIDER_SITE_OTHER): Payer: Self-pay | Admitting: Physician Assistant

## 2018-12-01 ENCOUNTER — Other Ambulatory Visit: Payer: Self-pay

## 2018-12-01 ENCOUNTER — Ambulatory Visit (INDEPENDENT_AMBULATORY_CARE_PROVIDER_SITE_OTHER): Payer: Medicare Other | Admitting: Physician Assistant

## 2018-12-01 DIAGNOSIS — E559 Vitamin D deficiency, unspecified: Secondary | ICD-10-CM | POA: Diagnosis not present

## 2018-12-01 DIAGNOSIS — Z6833 Body mass index (BMI) 33.0-33.9, adult: Secondary | ICD-10-CM

## 2018-12-01 DIAGNOSIS — E669 Obesity, unspecified: Secondary | ICD-10-CM

## 2018-12-01 DIAGNOSIS — E119 Type 2 diabetes mellitus without complications: Secondary | ICD-10-CM | POA: Diagnosis not present

## 2018-12-01 MED ORDER — VITAMIN D (ERGOCALCIFEROL) 1.25 MG (50000 UNIT) PO CAPS: 50000.0000 [IU] | ORAL_CAPSULE | ORAL | 0 refills | Status: DC

## 2018-12-02 NOTE — Progress Notes (Signed)
Office: [phone]  /  Fax: [phone] TeleHealth Visit:  [name] has verbally consented to this TeleHealth visit today. The patient is located at home, the provider is located at the [facility] office. The participants in this visit include the listed provider and patient. The visit was conducted today via Webex.  HPI:   Chief Complaint: OBESITY [name] is here to discuss her progress with her obesity treatment plan. She is on the  follow the Category 3 plan and is following her eating plan approximately 90 % of the time. She states she is exercising 0 minutes 0 times per week. [name] reports her weight at 203 lbs. She is eating more protein snacks around 4-5 pm when she is hungry. She is tired of sandwiches.  We were unable to weigh the patient today for this TeleHealth visit. She feels as if she has lost weight since her last visit. She has lost 18 lbs since starting treatment with [name].  Vitamin D deficiency [name] has a diagnosis of vitamin D deficiency. She is currently taking vit D and denies nausea, vomiting or muscle weakness.  Diabetes II [name] has a diagnosis of diabetes type II. [name] states fasting BGs range between 102 and 132 and denies any hypoglycemic episodes.nausea, vomiting, diarrhea, or polyphagia. Last A1c was 6.1. She has been working on intensive lifestyle modifications including diet, exercise, and weight loss to help control her blood glucose levels.  ASSESSMENT AND PLAN:  Vitamin D deficiency - Plan: Vitamin D, Ergocalciferol, (DRISDOL) 1.25 MG (50000 UT) CAPS capsule  Type 2 diabetes mellitus without complication, without long-term current use of insulin (HCC)  Class 1 obesity with serious comorbidity and body mass index (BMI) of 33.0 to 33.9 in adult, unspecified obesity type  PLAN: Vitamin D Deficiency [name] was informed that low vitamin D levels contributes to fatigue and are associated with obesity,  breast, and colon cancer. She agrees to continue to take prescription Vit D @50 ,000 IU every week #4 with no refills  and will follow up for routine testing of vitamin D, at least 2-3 times per year. She was informed of the risk of over-replacement of vitamin D and agrees to not increase her dose unless she discusses this with [name] first. Agrees to follow up with our clinic as directed.    Diabetes II [name] has been given extensive diabetes education by myself today including ideal fasting and post-prandial blood glucose readings, individual ideal HgA1c goals  and hypoglycemia prevention. We discussed the importance of good blood sugar control to decrease the likelihood of diabetic complications such as nephropathy, neuropathy, limb loss, blindness, coronary artery disease, and death. We discussed the importance of intensive lifestyle modification including diet, exercise and weight loss as the first line treatment for diabetes. [name] agrees to continue her diabetes medications and will follow up at the agreed upon time.   Obesity [name] is currently in the action stage of change. As such, her goal is to continue with weight loss efforts She has agreed to follow the Category 3 plan [name] has been instructed to work up to a goal of 150 minutes of combined cardio and strengthening exercise per week for weight loss and overall health benefits. We discussed the following Behavioral Modification Stratagies today: work on meal planning and easy cooking plans and keeping healthy foods in the home.    [name] has agreed to follow up with our clinic in 2 weeks. She was informed of the importance of frequent follow  up visits to maximize her success with intensive lifestyle modifications for her multiple health conditions.  ALLERGIES: Allergies  Allergen Reactions  . Augmentin [Amoxicillin-Pot Clavulanate] Rash    rash  . Erythromycin Nausea Only  . Penicillins Itching and Rash     Has patient had a PCN reaction causing immediate rash, facial/tongue/throat swelling, SOB or lightheadedness with hypotension: No Has patient had a PCN reaction causing severe rash involving mucus membranes or skin necrosis: No Has patient had a PCN reaction that required hospitalization: No Has patient had a PCN reaction occurring within the last 10 years: No  If all of the above answers are "NO", then may proceed with Cephalosporin use.     MEDICATIONS: Current Outpatient Medications on File Prior to Visit  Medication Sig Dispense Refill  . aspirin EC 81 MG tablet Take 1 tablet (81 mg total) by mouth daily. 90 tablet 3  . atorvastatin (LIPITOR) 40 MG tablet TAKE 1 TABLET (40 MG TOTAL) BY MOUTH DAILY AT 6 PM. 90 tablet 3  . Biotin 5000 MCG TABS Take 5,000 mcg by mouth daily.     . calcium-vitamin D (OSCAL WITH D) 500-200 MG-UNIT per tablet Take 2 tablets by mouth daily.     . clindamycin (CLEOCIN) 300 MG capsule TAKE 2 CAPSULES BY MOUTH 1 HOUR PRIOR TOO DENTAL CLEANING. (Patient not taking: Reported on 09/21/2018) 2 capsule 0  . clopidogrel (PLAVIX) 75 MG tablet TAKE 1 TABLET BY MOUTH EVERY DAY 90 tablet 3  . fluticasone (FLONASE) 50 MCG/ACT nasal spray Place 2 sprays into both nostrils daily.     [name] glucose blood (ONETOUCH VERIO) test strip Use to test blood sugars daily. Dx: E11.9 100 each 0  . Insulin Pen Needle (BD PEN NEEDLE NANO 2ND GEN) 32G X 4 MM MISC 1 Package by Does not apply route 2 (two) times daily. 100 each 0  . ipratropium (ATROVENT) 0.03 % nasal spray Place 2 sprays into both nostrils every 12 (twelve) hours.    . Lancets (ONETOUCH ULTRASOFT) lancets Use to test blood sugars daily. Dx: E11.9 100 each 12  . loratadine (CLARITIN) 10 MG tablet Take 10 mg by mouth at bedtime.     . metFORMIN (GLUCOPHAGE) 500 MG tablet Take one tablet with breakfast and 1/2-1 with dinner 180 tablet 1  . metoCLOPramide (REGLAN) 10 MG tablet TAKE 1/2 TABLET BY MOUTH 4 TIMES A DAY 60 tablet 5  .  metoprolol tartrate (LOPRESSOR) 25 MG tablet TAKE 1/2 TABLET BY MOUTH TWICE A DAY 90 tablet 2  . Multiple Vitamin (MULTIVITAMIN WITH MINERALS) TABS tablet Take 1 tablet by mouth daily.    . naproxen sodium (ALEVE) 220 MG tablet Take 220 mg by mouth.    [name] omeprazole (PRILOSEC OTC) 20 MG tablet Take 40 mg by mouth daily with breakfast.    . Probiotic Product (ALIGN) 4 MG CAPS Take 4 mg by mouth daily.     . psyllium (METAMUCIL SMOOTH TEXTURE) 28 % packet Take 1 packet by mouth daily before breakfast.     . venlafaxine XR (EFFEXOR-XR) 75 MG 24 hr capsule TAKE 1 CAPSULE (75 MG TOTAL) BY MOUTH DAILY WITH BREAKFAST. 90 capsule 2   No current facility-administered medications on file prior to visit.     PAST MEDICAL HISTORY: Past Medical History:  Diagnosis Date  . Allergy   . Anxiety   . Arthritis    back & knee  . Asthma     mild per pt shows up with resp  illness  . Chronic diastolic congestive heart failure ([place])   . Colon polyps   . Constipation   . Diabetes mellitus without complication ([place])   . Dyspnea   . Gastric polyps   . Gastroparesis   . GERD (gastroesophageal reflux disease)   . Headache    sinus headaches and migraines at times  . Heart murmur   . History of migraine headaches   . HTN (hypertension)   . Hyperlipidemia   . IBS (irritable bowel syndrome)   . Joint pain   . Lumbar disc disease   . S/P aortic valve replacement with bioprosthetic valve 08/23/2016   25 mm Edwards Intuity rapid-deployment bovine pericardial tissue valve via partial upper mini sternotomy  . Sleep apnea   . TIA (transient ischemic attack)     PAST SURGICAL HISTORY: Past Surgical History:  Procedure Laterality Date  . ABDOMINAL HYSTERECTOMY    . AORTIC VALVE REPLACEMENT N/A 08/23/2016   Procedure: AORTIC VALVE REPLACEMENT (AVR) - using partial Upper Sternotomy- 28mm Edwards Intuity Aortic Valve used;  Surgeon: Rexene Alberts, MD;  Location: [facility];  Service: Open Heart Surgery;  Laterality:  N/A;  . BLADDER SUSPENSION  2007  . BREAST ENHANCEMENT SURGERY  1980  . CARPAL TUNNEL RELEASE Bilateral   . COLONOSCOPY    . DORSAL COMPARTMENT RELEASE Right 09/15/2014   Procedure: RIGHT WRIST DEQUERVAINS RELEASE ;  Surgeon: Kathryne Hitch, MD;  Location: [facility] [place];  Service: Orthopedics;  Laterality: Right;  . ESOPHAGOGASTRODUODENOSCOPY    . KNEE ARTHROSCOPY  04/15/2012   Procedure: ARTHROSCOPY KNEE;  Surgeon: [name], MD;  Location: [facility];  Service: Orthopedics;  Laterality: Right;  . LAPAROSCOPIC CHOLECYSTECTOMY    . LASIK    . OOPHORECTOMY    . PALATE TO GINGIVA GRAFT  2017  . RIGHT/LEFT HEART CATH AND CORONARY ANGIOGRAPHY N/A 08/02/2016   Procedure: Right/Left Heart Cath and Coronary Angiography;  Surgeon: Sherren Mocha, MD;  Location: [facility];  Service: Cardiovascular;  Laterality: N/A;  . ROTATOR CUFF REPAIR Left   . TEE WITHOUT CARDIOVERSION N/A 08/23/2016   Procedure: TRANSESOPHAGEAL ECHOCARDIOGRAM (TEE);  Surgeon: Rexene Alberts, MD;  Location: [facility];  Service: Open Heart Surgery;  Laterality: N/A;  . TOTAL KNEE ARTHROPLASTY  04/15/2012   Procedure: TOTAL KNEE ARTHROPLASTY;  Surgeon: [name], MD;  Location: [facility];  Service: Orthopedics;  Laterality: Right;  . TRIGGER FINGER RELEASE Right 04/20/2015   Procedure: RIGHT TRIGGER FINGER RELEASE (TENDON SHEALTH INCISION) ;  Surgeon: [name], MD;  Location: [facility];  Service: Orthopedics;  Laterality: Right;    SOCIAL HISTORY: Social History   Tobacco Use  . Smoking status: Never Smoker  . Smokeless tobacco: Never Used  Substance Use Topics  . Alcohol use: No  . Drug use: No    FAMILY HISTORY: Family History  Problem Relation Age of Onset  . COPD Mother   . Colon polyps Mother   . Irritable bowel syndrome Mother   . Anxiety disorder Mother   . Heart disease Father   . Alcohol abuse Father   . Colon polyps Maternal Aunt     ROS: Review of Systems   Gastrointestinal: Negative for diarrhea, nausea and vomiting.  Musculoskeletal:       Negative for muscle weakness  Endo/Heme/Allergies:       Negative for hypoglycemia Negative for polyphagia     PHYSICAL EXAM: Pt in no acute distress  RECENT LABS  AND TESTS: BMET    Component Value Date/Time   NA 131 (L) 06/04/2018 1012   NA 129 (L) 01/19/2018 0959   K 4.6 06/04/2018 1012   CL 97 06/04/2018 1012   CO2 27 06/04/2018 1012   GLUCOSE 109 (H) 06/04/2018 1012   BUN 18 06/04/2018 1012   BUN 16 01/19/2018 0959   CREATININE 0.61 06/04/2018 1012   CALCIUM 9.1 06/04/2018 1012   GFRNONAA 79 01/19/2018 0959   GFRAA 91 01/19/2018 0959   Lab Results  Component Value Date   HGBA1C 6.1 06/04/2018   HGBA1C 6.6 (H) 01/19/2018   HGBA1C 6.2 (A) 11/25/2017   HGBA1C 6.4 05/23/2017   HGBA1C 6.7 (H) 01/02/2017   Lab Results  Component Value Date   INSULIN 12.1 01/19/2018   CBC    Component Value Date/Time   WBC 8.8 06/04/2018 1012   RBC 4.51 06/04/2018 1012   HGB 11.8 (L) 06/04/2018 1012   HGB 11.7 01/19/2018 0959   HCT 36.3 06/04/2018 1012   HCT 35.7 01/19/2018 0959   PLT 346.0 06/04/2018 1012   PLT 358 04/24/2017 1231   MCV 80.5 06/04/2018 1012   MCV 81 01/19/2018 0959   MCH 26.5 (L) 01/19/2018 0959   MCH 27.4 08/26/2016 0248   MCHC 32.5 06/04/2018 1012   RDW 15.7 (H) 06/04/2018 1012   RDW 14.9 01/19/2018 0959   LYMPHSABS 2.9 01/19/2018 0959   MONOABS 0.6 05/11/2015 0836   EOSABS 0.1 01/19/2018 0959   BASOSABS 0.0 01/19/2018 0959   Iron/TIBC/Ferritin/ %Sat No results found for: IRON, TIBC, FERRITIN, IRONPCTSAT Lipid Panel     Component Value Date/Time   CHOL 116 06/04/2018 1012   CHOL 123 01/19/2018 0959   TRIG 70.0 06/04/2018 1012   HDL 47.40 06/04/2018 1012   HDL 54 01/19/2018 0959   CHOLHDL 2 06/04/2018 1012   VLDL 14.0 06/04/2018 1012   LDLCALC 54 06/04/2018 1012   LDLCALC 54 01/19/2018 0959   LDLDIRECT 58.0 11/25/2017 1156   Hepatic Function Panel      Component Value Date/Time   PROT 6.5 06/04/2018 1012   PROT 6.6 01/19/2018 0959   ALBUMIN 4.4 06/04/2018 1012   ALBUMIN 4.6 01/19/2018 0959   AST 22 06/04/2018 1012   ALT 15 06/04/2018 1012   ALKPHOS 151 (H) 06/04/2018 1012   BILITOT 0.4 06/04/2018 1012   BILITOT 0.5 01/19/2018 0959   BILIDIR 0.1 05/11/2015 0836      Component Value Date/Time   TSH 3.38 06/04/2018 1012   TSH 5.300 (H) 01/19/2018 0959   TSH 3.390 04/24/2017 1231     Ref. Range 06/04/2018 10:12  VITD Latest Ref Range: 30.00 - 100.00 ng/mL 42.08     I, Renee Ramus, am acting as transcriptionist for [name], PA-C I, [name], PA-C have reviewed above note and agree with its content

## 2018-12-15 ENCOUNTER — Telehealth (INDEPENDENT_AMBULATORY_CARE_PROVIDER_SITE_OTHER): Payer: Medicare Other | Admitting: Physician Assistant

## 2018-12-16 ENCOUNTER — Ambulatory Visit (INDEPENDENT_AMBULATORY_CARE_PROVIDER_SITE_OTHER): Payer: Medicare Other | Admitting: Physician Assistant

## 2018-12-16 ENCOUNTER — Encounter (INDEPENDENT_AMBULATORY_CARE_PROVIDER_SITE_OTHER): Payer: Self-pay | Admitting: Physician Assistant

## 2018-12-16 ENCOUNTER — Other Ambulatory Visit: Payer: Self-pay

## 2018-12-16 VITALS — BP 138/49 | HR 51 | Temp 98.3°F | Ht 65.0 in | Wt 206.0 lb

## 2018-12-16 DIAGNOSIS — E669 Obesity, unspecified: Secondary | ICD-10-CM

## 2018-12-16 DIAGNOSIS — Z6834 Body mass index (BMI) 34.0-34.9, adult: Secondary | ICD-10-CM | POA: Diagnosis not present

## 2018-12-16 DIAGNOSIS — E119 Type 2 diabetes mellitus without complications: Secondary | ICD-10-CM | POA: Diagnosis not present

## 2018-12-16 DIAGNOSIS — E559 Vitamin D deficiency, unspecified: Secondary | ICD-10-CM

## 2018-12-16 NOTE — Progress Notes (Signed)
Office: [phone]  /  Fax: [phone]   HPI:   Chief Complaint: OBESITY [name] is here to discuss her progress with her obesity treatment plan. She is on the Category 3 plan and is following her eating plan approximately 95 % of the time. She states she is walking for 15-20 minutes 5 times per week. [name] reports that she has gotten back on track with her plan. She has her snack calories under control.  Her weight is 206 lb (93.4 kg) today and has gained 4 lbs since her last visit. She has lost 14 lbs since starting treatment with [name].  Diabetes II [name] has a diagnosis of diabetes type II. [name] states fasting BGs range between 100 and 138. She is on metformin and denies nausea, vomiting, or diarrhea. Last A1c was 6.1. She denies polyphagia or hypoglycemia. She has been working on intensive lifestyle modifications including diet, exercise, and weight loss to help control her blood glucose levels.  Vitamin D Deficiency [name] has a diagnosis of vitamin D deficiency. She is currently taking prescription Vit D and denies nausea, vomiting or muscle weakness.  ASSESSMENT AND PLAN:  Type 2 diabetes mellitus without complication, without long-term current use of insulin (HCC)  Vitamin D deficiency  Class 1 obesity with serious comorbidity and body mass index (BMI) of 34.0 to 34.9 in adult, unspecified obesity type  PLAN:  Diabetes II [name] has been given extensive diabetes education by myself today including ideal fasting and post-prandial blood glucose readings, individual ideal Hgb A1c goals and hypoglycemia prevention. We discussed the importance of good blood sugar control to decrease the likelihood of diabetic complications such as nephropathy, neuropathy, limb loss, blindness, coronary artery disease, and death. We discussed the importance of intensive lifestyle modification including diet, exercise and weight loss as the first line treatment for  diabetes. [name] agrees to continue her diabetes medications and weight loss. [name] agrees to follow up with our clinic in 2 weeks.  Vitamin D Deficiency [name] was informed that low vitamin D levels contributes to fatigue and are associated with obesity, breast, and colon cancer. [name] agrees to continue taking prescription Vit D 50,000 IU every week and will follow up for routine testing of vitamin D, at least 2-3 times per year. She was informed of the risk of over-replacement of vitamin D and agrees to not increase her dose unless she discusses this with [name] first. [name] agrees to follow up with our clinic in 2 weeks.  I spent > than 50% of the 25 minute visit on counseling as documented in the note.  Obesity [name] is currently in the action stage of change. As such, her goal is to continue with weight loss efforts She has agreed to follow the Category 3 plan [name] has been instructed to work up to a goal of 150 minutes of combined cardio and strengthening exercise per week for weight loss and overall health benefits. We discussed the following Behavioral Modification Strategies today: work on meal planning and easy cooking plans and keeping healthy foods in the home   [name] has agreed to follow up with our clinic in 2 weeks. She was informed of the importance of frequent follow up visits to maximize her success with intensive lifestyle modifications for her multiple health conditions.  ALLERGIES: Allergies  Allergen Reactions  . Augmentin [Amoxicillin-Pot Clavulanate] Rash    rash  . Erythromycin Nausea Only  . Penicillins Itching and Rash    Has patient had a PCN reaction causing  immediate rash, facial/tongue/throat swelling, SOB or lightheadedness with hypotension: No Has patient had a PCN reaction causing severe rash involving mucus membranes or skin necrosis: No Has patient had a PCN reaction that required hospitalization: No Has patient had a  PCN reaction occurring within the last 10 years: No  If all of the above answers are "NO", then may proceed with Cephalosporin use.     MEDICATIONS: Current Outpatient Medications on File Prior to Visit  Medication Sig Dispense Refill  . aspirin EC 81 MG tablet Take 1 tablet (81 mg total) by mouth daily. 90 tablet 3  . atorvastatin (LIPITOR) 40 MG tablet TAKE 1 TABLET (40 MG TOTAL) BY MOUTH DAILY AT 6 PM. 90 tablet 3  . Biotin 5000 MCG TABS Take 5,000 mcg by mouth daily.     . calcium-vitamin D (OSCAL WITH D) 500-200 MG-UNIT per tablet Take 2 tablets by mouth daily.     . clopidogrel (PLAVIX) 75 MG tablet TAKE 1 TABLET BY MOUTH EVERY DAY 90 tablet 3  . fluticasone (FLONASE) 50 MCG/ACT nasal spray Place 2 sprays into both nostrils daily.     [name] glucose blood (ONETOUCH VERIO) test strip Use to test blood sugars daily. Dx: E11.9 100 each 0  . Insulin Pen Needle (BD PEN NEEDLE NANO 2ND GEN) 32G X 4 MM MISC 1 Package by Does not apply route 2 (two) times daily. 100 each 0  . ipratropium (ATROVENT) 0.03 % nasal spray Place 2 sprays into both nostrils every 12 (twelve) hours.    . Lancets (ONETOUCH ULTRASOFT) lancets Use to test blood sugars daily. Dx: E11.9 100 each 12  . loratadine (CLARITIN) 10 MG tablet Take 10 mg by mouth at bedtime.     . metFORMIN (GLUCOPHAGE) 500 MG tablet Take one tablet with breakfast and 1/2-1 with dinner 180 tablet 1  . metoCLOPramide (REGLAN) 10 MG tablet TAKE 1/2 TABLET BY MOUTH 4 TIMES A DAY 60 tablet 5  . metoprolol tartrate (LOPRESSOR) 25 MG tablet TAKE 1/2 TABLET BY MOUTH TWICE A DAY 90 tablet 2  . Multiple Vitamin (MULTIVITAMIN WITH MINERALS) TABS tablet Take 1 tablet by mouth daily.    . naproxen sodium (ALEVE) 220 MG tablet Take 220 mg by mouth.    [name] omeprazole (PRILOSEC OTC) 20 MG tablet Take 40 mg by mouth daily with breakfast.    . Probiotic Product (ALIGN) 4 MG CAPS Take 4 mg by mouth daily.     . psyllium (METAMUCIL SMOOTH TEXTURE) 28 % packet Take 1  packet by mouth daily before breakfast.     . venlafaxine XR (EFFEXOR-XR) 75 MG 24 hr capsule TAKE 1 CAPSULE (75 MG TOTAL) BY MOUTH DAILY WITH BREAKFAST. 90 capsule 2  . Vitamin D, Ergocalciferol, (DRISDOL) 1.25 MG (50000 UT) CAPS capsule Take 1 capsule (50,000 Units total) by mouth every 7 (seven) days. 4 capsule 0  . clindamycin (CLEOCIN) 300 MG capsule TAKE 2 CAPSULES BY MOUTH 1 HOUR PRIOR TOO DENTAL CLEANING. (Patient not taking: Reported on 12/16/2018) 2 capsule 0   No current facility-administered medications on file prior to visit.     PAST MEDICAL HISTORY: Past Medical History:  Diagnosis Date  . Allergy   . Anxiety   . Arthritis    back & knee  . Asthma     mild per pt shows up with resp illness  . Chronic diastolic congestive heart failure ([place])   . Colon polyps   . Constipation   . Diabetes mellitus without complication (  HCC)   . Dyspnea   . Gastric polyps   . Gastroparesis   . GERD (gastroesophageal reflux disease)   . Headache    sinus headaches and migraines at times  . Heart murmur   . History of migraine headaches   . HTN (hypertension)   . Hyperlipidemia   . IBS (irritable bowel syndrome)   . Joint pain   . Lumbar disc disease   . S/P aortic valve replacement with bioprosthetic valve 08/23/2016   25 mm Edwards Intuity rapid-deployment bovine pericardial tissue valve via partial upper mini sternotomy  . Sleep apnea   . TIA (transient ischemic attack)     PAST SURGICAL HISTORY: Past Surgical History:  Procedure Laterality Date  . ABDOMINAL HYSTERECTOMY    . AORTIC VALVE REPLACEMENT N/A 08/23/2016   Procedure: AORTIC VALVE REPLACEMENT (AVR) - using partial Upper Sternotomy- 38mm Edwards Intuity Aortic Valve used;  Surgeon: Rexene Alberts, MD;  Location: [place];  Service: Open Heart Surgery;  Laterality: N/A;  . BLADDER SUSPENSION  2007  . BREAST ENHANCEMENT SURGERY  1980  . CARPAL TUNNEL RELEASE Bilateral   . COLONOSCOPY    . DORSAL COMPARTMENT RELEASE Right  09/15/2014   Procedure: RIGHT WRIST DEQUERVAINS RELEASE ;  Surgeon: Kathryne Hitch, MD;  Location: [place];  Service: Orthopedics;  Laterality: Right;  . ESOPHAGOGASTRODUODENOSCOPY    . KNEE ARTHROSCOPY  04/15/2012   Procedure: ARTHROSCOPY KNEE;  Surgeon: [name], MD;  Location: [place];  Service: Orthopedics;  Laterality: Right;  . LAPAROSCOPIC CHOLECYSTECTOMY    . LASIK    . OOPHORECTOMY    . PALATE TO GINGIVA GRAFT  2017  . RIGHT/LEFT HEART CATH AND CORONARY ANGIOGRAPHY N/A 08/02/2016   Procedure: Right/Left Heart Cath and Coronary Angiography;  Surgeon: Sherren Mocha, MD;  Location: [facility];  Service: Cardiovascular;  Laterality: N/A;  . ROTATOR CUFF REPAIR Left   . TEE WITHOUT CARDIOVERSION N/A 08/23/2016   Procedure: TRANSESOPHAGEAL ECHOCARDIOGRAM (TEE);  Surgeon: Rexene Alberts, MD;  Location: [place];  Service: Open Heart Surgery;  Laterality: N/A;  . TOTAL KNEE ARTHROPLASTY  04/15/2012   Procedure: TOTAL KNEE ARTHROPLASTY;  Surgeon: [name], MD;  Location: [place];  Service: Orthopedics;  Laterality: Right;  . TRIGGER FINGER RELEASE Right 04/20/2015   Procedure: RIGHT TRIGGER FINGER RELEASE (TENDON SHEALTH INCISION) ;  Surgeon: [name], MD;  Location: [place];  Service: Orthopedics;  Laterality: Right;    SOCIAL HISTORY: Social History   Tobacco Use  . Smoking status: Never Smoker  . Smokeless tobacco: Never Used  Substance Use Topics  . Alcohol use: No  . Drug use: No    FAMILY HISTORY: Family History  Problem Relation Age of Onset  . COPD Mother   . Colon polyps Mother   . Irritable bowel syndrome Mother   . Anxiety disorder Mother   . Heart disease Father   . Alcohol abuse Father   . Colon polyps Maternal Aunt     ROS: Review of Systems  Constitutional: Negative for weight loss.  Gastrointestinal: Negative for nausea and vomiting.  Musculoskeletal:       Negative muscle weakness   Endo/Heme/Allergies:       Negative hypoglycemia Negative polyphagia    PHYSICAL EXAM: Blood pressure (!) 138/49, pulse (!) 51, temperature 98.3 F (36.8 C), temperature source Oral, height 5\' 5"  (1.651 m), weight 206 lb (93.4 kg), SpO2 100 %. Body mass index is  34.28 kg/m. Physical Exam Vitals signs reviewed.  Constitutional:      Appearance: Normal appearance. She is obese.  Cardiovascular:     Rate and Rhythm: Normal rate.     Pulses: Normal pulses.  Pulmonary:     Effort: Pulmonary effort is normal.     Breath sounds: Normal breath sounds.  Musculoskeletal: Normal range of motion.  Skin:    General: Skin is warm and dry.  Neurological:     Mental Status: She is alert and oriented to person, place, and time.  Psychiatric:        Mood and Affect: Mood normal.        Behavior: Behavior normal.     RECENT LABS AND TESTS: BMET    Component Value Date/Time   NA 131 (L) 06/04/2018 1012   NA 129 (L) 01/19/2018 0959   K 4.6 06/04/2018 1012   CL 97 06/04/2018 1012   CO2 27 06/04/2018 1012   GLUCOSE 109 (H) 06/04/2018 1012   BUN 18 06/04/2018 1012   BUN 16 01/19/2018 0959   CREATININE 0.61 06/04/2018 1012   CALCIUM 9.1 06/04/2018 1012   GFRNONAA 79 01/19/2018 0959   GFRAA 91 01/19/2018 0959   Lab Results  Component Value Date   HGBA1C 6.1 06/04/2018   HGBA1C 6.6 (H) 01/19/2018   HGBA1C 6.2 (A) 11/25/2017   HGBA1C 6.4 05/23/2017   HGBA1C 6.7 (H) 01/02/2017   Lab Results  Component Value Date   INSULIN 12.1 01/19/2018   CBC    Component Value Date/Time   WBC 8.8 06/04/2018 1012   RBC 4.51 06/04/2018 1012   HGB 11.8 (L) 06/04/2018 1012   HGB 11.7 01/19/2018 0959   HCT 36.3 06/04/2018 1012   HCT 35.7 01/19/2018 0959   PLT 346.0 06/04/2018 1012   PLT 358 04/24/2017 1231   MCV 80.5 06/04/2018 1012   MCV 81 01/19/2018 0959   MCH 26.5 (L) 01/19/2018 0959   MCH 27.4 08/26/2016 0248   MCHC 32.5 06/04/2018 1012   RDW 15.7 (H) 06/04/2018 1012   RDW 14.9  01/19/2018 0959   LYMPHSABS 2.9 01/19/2018 0959   MONOABS 0.6 05/11/2015 0836   EOSABS 0.1 01/19/2018 0959   BASOSABS 0.0 01/19/2018 0959   Iron/TIBC/Ferritin/ %Sat No results found for: IRON, TIBC, FERRITIN, IRONPCTSAT Lipid Panel     Component Value Date/Time   CHOL 116 06/04/2018 1012   CHOL 123 01/19/2018 0959   TRIG 70.0 06/04/2018 1012   HDL 47.40 06/04/2018 1012   HDL 54 01/19/2018 0959   CHOLHDL 2 06/04/2018 1012   VLDL 14.0 06/04/2018 1012   LDLCALC 54 06/04/2018 1012   LDLCALC 54 01/19/2018 0959   LDLDIRECT 58.0 11/25/2017 1156   Hepatic Function Panel     Component Value Date/Time   PROT 6.5 06/04/2018 1012   PROT 6.6 01/19/2018 0959   ALBUMIN 4.4 06/04/2018 1012   ALBUMIN 4.6 01/19/2018 0959   AST 22 06/04/2018 1012   ALT 15 06/04/2018 1012   ALKPHOS 151 (H) 06/04/2018 1012   BILITOT 0.4 06/04/2018 1012   BILITOT 0.5 01/19/2018 0959   BILIDIR 0.1 05/11/2015 0836      Component Value Date/Time   TSH 3.38 06/04/2018 1012   TSH 5.300 (H) 01/19/2018 0959   TSH 3.390 04/24/2017 1231      OBESITY BEHAVIORAL INTERVENTION VISIT  Today's visit was # 21   Starting weight: 220 lbs Starting date: 01/19/18 Today's weight : 206 lbs Today's date: 12/16/2018 Total lbs lost to  date: 27    ASK: We discussed the diagnosis of obesity with [place] today and Liisa agreed to give [name] permission to discuss obesity behavioral modification therapy today.  ASSESS: [name] has the diagnosis of obesity and her BMI today is 34.28 [name] is in the action stage of change   ADVISE: [name] was educated on the multiple health risks of obesity as well as the benefit of weight loss to improve her health. She was advised of the need for long term treatment and the importance of lifestyle modifications to improve her current health and to decrease her risk of future health problems.  AGREE: Multiple dietary modification options and treatment options  were discussed and  [name] agreed to follow the recommendations documented in the above note.  ARRANGE: [name] was educated on the importance of frequent visits to treat obesity as outlined per CMS and USPSTF guidelines and agreed to schedule her next follow up appointment today.  [name], am acting as transcriptionist for [name], PA-C I, [name], PA-C have reviewed above note and agree with its content

## 2018-12-20 ENCOUNTER — Encounter: Payer: Self-pay | Admitting: Family Medicine

## 2018-12-21 ENCOUNTER — Other Ambulatory Visit (INDEPENDENT_AMBULATORY_CARE_PROVIDER_SITE_OTHER): Payer: Self-pay | Admitting: Physician Assistant

## 2018-12-21 ENCOUNTER — Encounter: Payer: Self-pay | Admitting: Family Medicine

## 2018-12-21 ENCOUNTER — Other Ambulatory Visit: Payer: Self-pay

## 2018-12-21 DIAGNOSIS — E559 Vitamin D deficiency, unspecified: Secondary | ICD-10-CM

## 2018-12-21 MED ORDER — VENLAFAXINE HCL ER 75 MG PO CP24: 75.0000 mg | ORAL_CAPSULE | Freq: Every day | ORAL | 2 refills | Status: DC

## 2018-12-25 ENCOUNTER — Encounter: Payer: Self-pay | Admitting: Family Medicine

## 2018-12-27 ENCOUNTER — Other Ambulatory Visit: Payer: Self-pay | Admitting: Family Medicine

## 2018-12-28 ENCOUNTER — Encounter: Payer: Self-pay | Admitting: Family Medicine

## 2018-12-28 ENCOUNTER — Encounter (INDEPENDENT_AMBULATORY_CARE_PROVIDER_SITE_OTHER): Payer: Self-pay | Admitting: Physician Assistant

## 2018-12-28 NOTE — Telephone Encounter (Signed)
Please review

## 2018-12-29 ENCOUNTER — Encounter (INDEPENDENT_AMBULATORY_CARE_PROVIDER_SITE_OTHER): Payer: Self-pay | Admitting: Physician Assistant

## 2018-12-29 NOTE — Telephone Encounter (Signed)
fyi

## 2018-12-31 ENCOUNTER — Other Ambulatory Visit: Payer: Self-pay

## 2018-12-31 ENCOUNTER — Ambulatory Visit (INDEPENDENT_AMBULATORY_CARE_PROVIDER_SITE_OTHER): Payer: Medicare Other | Admitting: Adult Health

## 2018-12-31 ENCOUNTER — Encounter: Payer: Self-pay | Admitting: Adult Health

## 2018-12-31 DIAGNOSIS — G4733 Obstructive sleep apnea (adult) (pediatric): Secondary | ICD-10-CM | POA: Diagnosis not present

## 2018-12-31 NOTE — Assessment & Plan Note (Signed)
Wt loss  

## 2018-12-31 NOTE — Progress Notes (Signed)
@Patient  ID: [name], female    DOB: September 05, 1950, 68 y.o.   MRN: [id number]  Chief Complaint  Patient presents with  . Follow-up    OSA     Referring provider: [name], MD  HPI: 68 year old female followed for obstructive sleep apnea  TEST/EVENTS :  Home sleep study July 2017 AHI 19/hr   12/31/2018 Follow up : OSA  Patient returns for a follow-up for sleep apnea.  Patient has moderate sleep apnea and is on CPAP at bedtime.  Patient says she is doing well on CPAP.  She denies any significant daytime sleepiness.  Feels that she benefits from CPAP.  She says she wears her CPAP every night.  She never misses a night.  CPAP download shows excellent compliance with daily average usage at 9 hours.  Patient is on auto CPAP 5 to 10 cm H2O.  AHI 4.0.   Allergies  Allergen Reactions  . Augmentin [Amoxicillin-Pot Clavulanate] Rash    rash  . Erythromycin Nausea Only  . Penicillins Itching and Rash    Has patient had a PCN reaction causing immediate rash, facial/tongue/throat swelling, SOB or lightheadedness with hypotension: No Has patient had a PCN reaction causing severe rash involving mucus membranes or skin necrosis: No Has patient had a PCN reaction that required hospitalization: No Has patient had a PCN reaction occurring within the last 10 years: No  If all of the above answers are "NO", then may proceed with Cephalosporin use.     Immunization History  Administered Date(s) Administered  . Influenza Split 04/04/2011, 03/05/2012  . Influenza Whole 04/16/2007, 03/16/2009, 03/13/2010  . Influenza,inj,Quad PF,6+ Mos 03/30/2013, 03/10/2014, 03/23/2015, 02/27/2016  . Influenza-Unspecified 02/27/2017, 02/28/2017, 01/31/2018  . Pneumococcal Conjugate-13 05/21/2016  . Pneumococcal Polysaccharide-23 05/23/2017  . Td 06/24/1996, 08/06/2007, 11/07/2017  . Tdap 11/07/2017  . Zoster 05/12/2014    Past Medical History:  Diagnosis Date  . Allergy   . Anxiety   .  Arthritis    back & knee  . Asthma     mild per pt shows up with resp illness  . Chronic diastolic congestive heart failure ([place])   . Colon polyps   . Constipation   . Diabetes mellitus without complication ([place])   . Dyspnea   . Gastric polyps   . Gastroparesis   . GERD (gastroesophageal reflux disease)   . Headache    sinus headaches and migraines at times  . Heart murmur   . History of migraine headaches   . HTN (hypertension)   . Hyperlipidemia   . IBS (irritable bowel syndrome)   . Joint pain   . Lumbar disc disease   . S/P aortic valve replacement with bioprosthetic valve 08/23/2016   25 mm Edwards Intuity rapid-deployment bovine pericardial tissue valve via partial upper mini sternotomy  . Sleep apnea   . TIA (transient ischemic attack)     Tobacco History: Social History   Tobacco Use  Smoking Status Never Smoker  Smokeless Tobacco Never Used   Counseling given: Not Answered   Outpatient Medications Prior to Visit  Medication Sig Dispense Refill  . aspirin EC 81 MG tablet Take 1 tablet (81 mg total) by mouth daily. 90 tablet 3  . atorvastatin (LIPITOR) 40 MG tablet TAKE 1 TABLET (40 MG TOTAL) BY MOUTH DAILY AT 6 PM. 90 tablet 3  . Biotin 5000 MCG TABS Take 5,000 mcg by mouth daily.     . calcium-vitamin D (OSCAL WITH D) 500-200 MG-UNIT per  tablet Take 2 tablets by mouth daily.     . clindamycin (CLEOCIN) 300 MG capsule TAKE 2 CAPSULES BY MOUTH 1 HOUR PRIOR TOO DENTAL CLEANING. 2 capsule 0  . clopidogrel (PLAVIX) 75 MG tablet TAKE 1 TABLET BY MOUTH EVERY DAY 90 tablet 3  . fluticasone (FLONASE) 50 MCG/ACT nasal spray Place 2 sprays into both nostrils daily.     [name] glucose blood (ONETOUCH VERIO) test strip Use to test blood sugars daily. Dx: E11.9 100 each 0  . Insulin Pen Needle (BD PEN NEEDLE NANO 2ND GEN) 32G X 4 MM MISC 1 Package by Does not apply route 2 (two) times daily. 100 each 0  . ipratropium (ATROVENT) 0.03 % nasal spray Place 2 sprays into both nostrils  every 12 (twelve) hours.    . Lancets (ONETOUCH ULTRASOFT) lancets Use to test blood sugars daily. Dx: E11.9 100 each 12  . loratadine (CLARITIN) 10 MG tablet Take 10 mg by mouth at bedtime.     . metFORMIN (GLUCOPHAGE) 500 MG tablet Take one tablet with breakfast and 1/2-1 with dinner 180 tablet 1  . metoCLOPramide (REGLAN) 10 MG tablet TAKE 1/2 TABLET BY MOUTH 4 TIMES A DAY 60 tablet 5  . metoprolol tartrate (LOPRESSOR) 25 MG tablet TAKE 1/2 TABLET BY MOUTH TWICE A DAY 90 tablet 2  . Multiple Vitamin (MULTIVITAMIN WITH MINERALS) TABS tablet Take 1 tablet by mouth daily.    . naproxen sodium (ALEVE) 220 MG tablet Take 220 mg by mouth.    [name] omeprazole (PRILOSEC OTC) 20 MG tablet Take 40 mg by mouth daily with breakfast.    . Probiotic Product (ALIGN) 4 MG CAPS Take 4 mg by mouth daily.     . psyllium (METAMUCIL SMOOTH TEXTURE) 28 % packet Take 1 packet by mouth daily before breakfast.     . venlafaxine XR (EFFEXOR-XR) 75 MG 24 hr capsule Take 1 capsule (75 mg total) by mouth daily with breakfast. 90 capsule 2  . Vitamin D, Ergocalciferol, (DRISDOL) 1.25 MG (50000 UT) CAPS capsule Take 1 capsule (50,000 Units total) by mouth every 7 (seven) days. 4 capsule 0   No facility-administered medications prior to visit.      Review of Systems:   Constitutional:   No  weight loss, night sweats,  Fevers, chills, fatigue, or  lassitude.  HEENT:   No headaches,  Difficulty swallowing,  Tooth/dental problems, or  Sore throat,                No sneezing, itching, ear ache, nasal congestion, post nasal drip,   CV:  No chest pain,  Orthopnea, PND, swelling in lower extremities, anasarca, dizziness, palpitations, syncope.   GI  No heartburn, indigestion, abdominal pain, nausea, vomiting, diarrhea, change in bowel habits, loss of appetite, bloody stools.   Resp: No shortness of breath with exertion or at rest.  No excess mucus, no productive cough,  No non-productive cough,  No coughing up of blood.  No  change in color of mucus.  No wheezing.  No chest wall deformity  Skin: no rash or lesions.  GU: no dysuria, change in color of urine, no urgency or frequency.  No flank pain, no hematuria   MS:  No joint pain or swelling.  No decreased range of motion.  No back pain.    Physical Exam  BP 110/70 (BP Location: Left Arm, Cuff Size: Normal)   Pulse (!) 50   Temp 98.8 F (37.1 C) (Oral)   Ht 5'  6" (1.676 m)   Wt 210 lb (95.3 kg)   LMP  (LMP Unknown)   SpO2 100%   BMI 33.89 kg/m   GEN: A/Ox3; pleasant , NAD, obese    HEENT:  [place]/AT,    NOSE-clear, THROAT-clear, no lesions, no postnasal drip or exudate noted. Class 3 MP airway   NECK:  Supple w/ fair ROM; no JVD; normal carotid impulses w/o bruits; no thyromegaly or nodules palpated; no lymphadenopathy.    RESP  Clear  P & A; w/o, wheezes/ rales/ or rhonchi. no accessory muscle use, no dullness to percussion  CARD:  RRR, no m/r/g, no peripheral edema, pulses intact, no cyanosis or clubbing.  GI:   Soft & nt; nml bowel sounds; no organomegaly or masses detected.   Musco: Warm bil, no deformities or joint swelling noted.   Neuro: alert, no focal deficits noted.    Skin: Warm, no lesions or rashes    Lab Results:  CBC  BMET  BNP No results found for: BNP  Imaging: No results found.    PFT Results Latest Ref Rng & Units 08/21/2016  FVC-Pre L 3.07  FVC-Predicted Pre % 94  FVC-Post L 3.00  FVC-Predicted Post % 91  Pre FEV1/FVC % % 84  Post FEV1/FCV % % 85  FEV1-Pre L 2.59  FEV1-Predicted Pre % 104  FEV1-Post L 2.55  DLCO UNC% % 72  DLCO COR %Predicted % 93  TLC L 5.36  TLC % Predicted % 103  RV % Predicted % 115    No results found for: NITRICOXIDE      Assessment & Plan:   OSA (obstructive sleep apnea) Excellent control and compliance   Plan  Patient Instructions  Continue on CPAP At bedtime.  Keep up good work.  Do not drive if sleepy .  Work on healthy weight  Follow up with Dr. Halford Chessman  Or  Haydan Wedig in 1 year and As needed        OBESITY, MORBID Wt loss      Rexene Edison, NP 12/31/2018

## 2018-12-31 NOTE — Patient Instructions (Signed)
Continue on CPAP At bedtime.  Keep up good work.  Do not drive if sleepy .  Work on healthy weight  Follow up with Dr. Sood  Or Thera Basden in 1 year and As needed    

## 2018-12-31 NOTE — Assessment & Plan Note (Signed)
Excellent control and compliance   Plan  Patient Instructions  Continue on CPAP At bedtime.  Keep up good work.  Do not drive if sleepy .  Work on healthy weight  Follow up with Dr. Halford Chessman  Or Susumu Hackler in 1 year and As needed

## 2019-01-03 ENCOUNTER — Encounter (INDEPENDENT_AMBULATORY_CARE_PROVIDER_SITE_OTHER): Payer: Self-pay | Admitting: Physician Assistant

## 2019-01-03 ENCOUNTER — Other Ambulatory Visit: Payer: Self-pay | Admitting: Family Medicine

## 2019-01-03 DIAGNOSIS — E119 Type 2 diabetes mellitus without complications: Secondary | ICD-10-CM

## 2019-01-04 ENCOUNTER — Encounter (INDEPENDENT_AMBULATORY_CARE_PROVIDER_SITE_OTHER): Payer: Self-pay | Admitting: Physician Assistant

## 2019-01-04 ENCOUNTER — Other Ambulatory Visit (INDEPENDENT_AMBULATORY_CARE_PROVIDER_SITE_OTHER): Payer: Self-pay

## 2019-01-04 DIAGNOSIS — E559 Vitamin D deficiency, unspecified: Secondary | ICD-10-CM

## 2019-01-04 MED ORDER — VITAMIN D (ERGOCALCIFEROL) 1.25 MG (50000 UNIT) PO CAPS: 50000.0000 [IU] | ORAL_CAPSULE | ORAL | 0 refills | Status: DC

## 2019-01-04 NOTE — Telephone Encounter (Signed)
Please review and advise.

## 2019-01-04 NOTE — Progress Notes (Signed)
Phone [phone]   Subjective:  [name] is a 68 y.o. year old very pleasant female patient who presents for/with See problem oriented charting Chief Complaint  Patient presents with   Hypertension   Diabetes    Discuss lowering Metformin   Irritable Bowel Syndrome   Sleep Apnea   Gastroesophageal Reflux   Hyperlipidemia   ROS-  Denies HA, CP, SOB, dizziness, visual changes.    Past Medical History-  Patient Active Problem List   Diagnosis Date Noted   History of CVA in adulthood 08/13/2017    Priority: High   S/P minimally invasive aortic valve replacement with bioprosthetic valve 08/23/2016    Priority: High   Diastolic dysfunction     Priority: High   Diabetes mellitus type 2, controlled ([place]) 05/12/2014    Priority: High   Vitamin D deficiency 06/04/2018    Priority: Medium   Hyponatremia 02/17/2018    Priority: Medium   Hypertension associated with diabetes ([place]) 01/02/2017    Priority: Medium   Osteopenia 05/27/2016    Priority: Medium   OSA (obstructive sleep apnea) 02/27/2016    Priority: Medium   Elevated alkaline phosphatase level 05/16/2015    Priority: Medium   GERD (gastroesophageal reflux disease) 03/10/2014    Priority: Medium   Generalized anxiety disorder 03/10/2014    Priority: Medium   Migraines 03/10/2014    Priority: Medium   Irritable bowel syndrome 12/15/2008    Priority: Medium   OBESITY, MORBID 10/12/2008    Priority: Medium   Hyperlipidemia associated with type 2 diabetes mellitus ([place]) 02/09/2008    Priority: Medium   Blurry vision 08/13/2017    Priority: Low   Bruxism 10/23/2015    Priority: Low   Atypical chest pain 11/10/2014    Priority: Low   Acne 05/12/2014    Priority: Low   EUSTACHIAN TUBE DYSFUNCTION, CHRONIC 06/14/2010    Priority: Low   Low back pain 08/11/2008    Priority: Low   Allergic rhinitis 02/12/2007    Priority: Low   ASTHMA 02/12/2007    Priority: Low     Medications- reviewed and updated Current Outpatient Medications  Medication Sig Dispense Refill   aspirin EC 81 MG tablet Take 1 tablet (81 mg total) by mouth daily. 90 tablet 3   atorvastatin (LIPITOR) 40 MG tablet TAKE 1 TABLET (40 MG TOTAL) BY MOUTH DAILY AT 6 PM. 90 tablet 3   Biotin 5000 MCG TABS Take 5,000 mcg by mouth daily.      calcium-vitamin D (OSCAL WITH D) 500-200 MG-UNIT per tablet Take 2 tablets by mouth daily.      clindamycin (CLEOCIN) 300 MG capsule TAKE 2 CAPSULES BY MOUTH 1 HOUR PRIOR TOO DENTAL CLEANING. 2 capsule 0   clopidogrel (PLAVIX) 75 MG tablet TAKE 1 TABLET BY MOUTH EVERY DAY 90 tablet 3   fluticasone (FLONASE) 50 MCG/ACT nasal spray Place 2 sprays into both nostrils daily.      Insulin Pen Needle (BD PEN NEEDLE NANO 2ND GEN) 32G X 4 MM MISC 1 Package by Does not apply route 2 (two) times daily. 100 each 0   ipratropium (ATROVENT) 0.03 % nasal spray Place 2 sprays into both nostrils every 12 (twelve) hours.     Lancets (ONETOUCH ULTRASOFT) lancets Use to test blood sugars daily. Dx: E11.9 100 each 12   loratadine (CLARITIN) 10 MG tablet Take 10 mg by mouth at bedtime.      Melatonin 5 MG TABS Take by mouth.  metFORMIN (GLUCOPHAGE) 500 MG tablet Take one tablet with breakfast and 1/2-1 with dinner 180 tablet 1   metoCLOPramide (REGLAN) 10 MG tablet TAKE 1/2 TABLET BY MOUTH 4 TIMES A DAY 60 tablet 5   metoprolol tartrate (LOPRESSOR) 25 MG tablet TAKE 1/2 TABLET BY MOUTH TWICE A DAY 90 tablet 2   Multiple Vitamin (MULTIVITAMIN WITH MINERALS) TABS tablet Take 1 tablet by mouth daily.     naproxen sodium (ALEVE) 220 MG tablet Take 220 mg by mouth.     omeprazole (PRILOSEC OTC) 20 MG tablet Take 40 mg by mouth daily with breakfast.     ONETOUCH VERIO test strip USE TO TEST BLOOD SUGARS DAILY. 100 strip 4   Probiotic Product (ALIGN) 4 MG CAPS Take 4 mg by mouth daily.      psyllium (METAMUCIL SMOOTH TEXTURE) 28 % packet Take 1 packet by  mouth daily before breakfast.      venlafaxine XR (EFFEXOR-XR) 75 MG 24 hr capsule Take 1 capsule (75 mg total) by mouth daily with breakfast. 90 capsule 2   Vitamin D, Ergocalciferol, (DRISDOL) 1.25 MG (50000 UT) CAPS capsule Take 1 capsule (50,000 Units total) by mouth every 7 (seven) days. 4 capsule 0   No current facility-administered medications for this visit.      Objective:  BP 140/80 (BP Location: Left Arm, Patient Position: Sitting, Cuff Size: Normal)    Pulse (!) 52    Temp 99 F (37.2 C) (Oral)    Ht 5\' 6"  (1.676 m)    Wt 210 lb 9.6 oz (95.5 kg)    LMP  (LMP Unknown)    SpO2 99%    BMI 33.99 kg/m  Gen: NAD, resting comfortably CV: RRR no murmurs rubs or gallops Lungs: CTAB no crackles, wheeze, rhonchi Abdomen: soft/nontender/nondistended/normal bowel sounds. No rebound or guarding.  Ext: no edema Skin: warm, dry Neuro: normal speech    Assessment and Plan   # frontal sinusitis- finally resolved on prednisone after omnicef   #RUQ pain - does not have gallbladder but having pain in RUQ about twice in last 3 months and lasts about 3 minutes. Could be gas related- advised to monitor and if worsening symptoms please let us know or more frequent symptoms  %s/p aortic valve replacement with bioprosthetic valve- requires dental prophylaxis. Follows with Dr. Meda Coffee. Catheterization 08/02/16 with minor nonobstructive CAD. She is at yearly follow up now.   % OSA- remains on CPAP, previously with Dr. Halford Chessman, now seeing PA Tammy Parrett.   %obesity- working hard with weigh to wellness program- has been a hard time during pandemic and feels like has set her back some.   # Diabetes S: compliant with metformin 500mg  BID- now down to 500 AM, 500 PM.  CBGs- Checking at home daily. Today fasting BG was 116. No hypoglycemic episodes. No missed doses, no side effects.   Exercise and diet- Mindful or carb and sugar intake. Doing [facility] through [facility]. Not getting  regular structured exercise. Hoping to get back to the gym soon.  Lab Results  Component Value Date   HGBA1C 5.7 (A) 01/05/2019   HGBA1C 6.1 06/04/2018   HGBA1C 6.6 (H) 01/19/2018   A/P: doing well- she is going to try 500 in AM and 250 mg in PM of metformin and follow up in 4-6 months  #hypertension/diastolic dysfunction (prior listed as CHF but no chronic lasix need and no hospitalization for HF) S: compliant with metoprolol 12.5mg  BID. In past on  lasix. No missed doses, no side effects. Not checking BP at home. Not adding salt to food. BP Readings from Last 3 Encounters:  01/05/19 140/80  12/31/18 110/70  12/16/18 (!) 138/49  A/P: mild poor control today but has had good #s at weigh to wellness- we opted to recheck at follow up (has not had good success with home monitoring) . Continue current medications. Has recheck next week with weigh to wellness so hopeful has improved at that time- can make adjustments if needed if not  #hyperlipidemia/history of stroke S: for lipids compliant with atorvastatin 40mg . No missed doses, no side effects.   Patient also compliant with stroke history- plavix and aspirin (dr. Tomi Likens recommends both) Lab Results  Component Value Date   CHOL 116 06/04/2018   HDL 47.40 06/04/2018   LDLCALC 54 06/04/2018   LDLDIRECT 58.0 11/25/2017   TRIG 70.0 06/04/2018   CHOLHDL 2 06/04/2018   A/P:  Stable. Continue current medications.   # generalized anxiety S: patient is compliant with venlafaxine 75mg .  We are using lower dose as had more hyponatremia in the past- possible SIADH. Symptoms have been moderately controlled but "not great".  A/P: reasonable control despite lower dose venlafaxine- will keep lower dose to help avoid worsening hyponatremia   # Migraines - follows with Dr. Tomi Likens S:patient with history of stroke- sumatriptan was stopped due to stroke history. Venlafaxine mentioned for migraine prophylaxis. PRN treatment with ibuprofen or exedrin with  reglan per Dr. Tomi Likens- thought lower risk than sumatriptan . She reports 1 migraine since her last visit.  A/P: doing well recently- continue current treatment   # Asthma/allergic rhinitis S: doing well  recently with prn albuterol only. Compliant with claritin, astenlin, nasonex, saline- also gets allergy shots. Does not have rescue inhaler but feels that asthma sx are well controlled.  A/P: Stable x2. Continue current medications.   #RUQ pain  Sx x 2 months. Denies nausea. Pain is sharp and only lasts about 5 minutes. She has IBS so bowel movements are not regular. She takes Metamucil every morning. She is drinking 32 oz of water in the mornings. Remains on PPI for reflux  #IBS- drinking 60 oz in AM to get bowels moving and takes metamucil. Also on reglan. 98% of time has BM- discussed could add miralax 1/2 capful every other day  Advised labs today Recommended follow up: 6 months physical advised Future Appointments  Date Time Provider [place]  01/12/2019 11:00 AM [name], PA-C [facility] None  06/14/2019  3:05 PM [facility] 1 [id number]  07/20/2019  1:30 PM Metta Clines R, DO [facility] None  12/31/2019 11:00 AM Parrett, Fonnie Mu, NP [facility] None    Lab/Order associations:   ICD-10-CM   1. Controlled type 2 diabetes mellitus without complication, without long-term current use of insulin (HCC)  E11.9 Microalbumin / creatinine urine ratio    POCT glycosylated hemoglobin (Hb A1C)    CANCELED: Hemoglobin A1c  2. Hypertension associated with diabetes ([place])  E11.59 CBC   I10 Comprehensive metabolic panel  3. Hyperlipidemia associated with type 2 diabetes mellitus (HCC)  E11.69 CBC   E78.5 Comprehensive metabolic panel  4. Generalized anxiety disorder  F41.1   5. Vitamin D deficiency  E55.9 VITAMIN D 25 Hydroxy (Vit-D Deficiency, Fractures)   No orders of the defined types were placed in this encounter.  Return precautions advised.  Garret Reddish,  MD

## 2019-01-04 NOTE — Telephone Encounter (Signed)
Ok to refill her vit D. Thanks

## 2019-01-04 NOTE — Patient Instructions (Addendum)
Health Maintenance Due  Topic Date Due  . HEMOGLOBIN A1C - today with labs (blood draw) 12/04/2018  . URINE MICROALBUMIN - today with labs 01/20/2019   Please stop by lab before you go If you do not have mychart- we will call you about results within 5 business days of [name] receiving them.  If you have mychart- we will send your results within 3 business days of [name] receiving them.  If abnormal or we want to clarify a result, we will call or mychart you to make sure you receive the message.  If you have questions or concerns or don't hear within 5-7 days, please send [name] a message or call us.   6 month physical advised  discussed could add miralax 1/2 capful every other day- 1-2 week trial  No other changes today- let us know if pain in abdomen becomes more frequent but sounds like gas like pain

## 2019-01-04 NOTE — Telephone Encounter (Signed)
Please review

## 2019-01-05 ENCOUNTER — Other Ambulatory Visit: Payer: Self-pay

## 2019-01-05 ENCOUNTER — Encounter: Payer: Self-pay | Admitting: Family Medicine

## 2019-01-05 ENCOUNTER — Ambulatory Visit (INDEPENDENT_AMBULATORY_CARE_PROVIDER_SITE_OTHER): Payer: Medicare Other | Admitting: Family Medicine

## 2019-01-05 VITALS — BP 140/80 | HR 52 | Temp 99.0°F | Ht 66.0 in | Wt 210.6 lb

## 2019-01-05 DIAGNOSIS — F411 Generalized anxiety disorder: Secondary | ICD-10-CM

## 2019-01-05 DIAGNOSIS — E119 Type 2 diabetes mellitus without complications: Secondary | ICD-10-CM

## 2019-01-05 DIAGNOSIS — E1169 Type 2 diabetes mellitus with other specified complication: Secondary | ICD-10-CM | POA: Diagnosis not present

## 2019-01-05 DIAGNOSIS — E559 Vitamin D deficiency, unspecified: Secondary | ICD-10-CM

## 2019-01-05 DIAGNOSIS — E785 Hyperlipidemia, unspecified: Secondary | ICD-10-CM

## 2019-01-05 DIAGNOSIS — I10 Essential (primary) hypertension: Secondary | ICD-10-CM | POA: Diagnosis not present

## 2019-01-05 DIAGNOSIS — I152 Hypertension secondary to endocrine disorders: Secondary | ICD-10-CM

## 2019-01-05 DIAGNOSIS — Z953 Presence of xenogenic heart valve: Secondary | ICD-10-CM

## 2019-01-05 DIAGNOSIS — E1159 Type 2 diabetes mellitus with other circulatory complications: Secondary | ICD-10-CM

## 2019-01-05 LAB — COMPREHENSIVE METABOLIC PANEL
ALT: 14 U/L (ref 0–35)
AST: 22 U/L (ref 0–37)
Albumin: 4.4 g/dL (ref 3.5–5.2)
Alkaline Phosphatase: 128 U/L — ABNORMAL HIGH (ref 39–117)
BUN: 23 mg/dL (ref 6–23)
CO2: 25 mEq/L (ref 19–32)
Calcium: 8.9 mg/dL (ref 8.4–10.5)
Chloride: 96 mEq/L (ref 96–112)
Creatinine, Ser: 0.55 mg/dL (ref 0.40–1.20)
GFR: 109.88 mL/min (ref >60.00)
Glucose, Bld: 99 mg/dL (ref 70–99)
Potassium: 4.6 mEq/L (ref 3.5–5.1)
Sodium: 128 mEq/L — ABNORMAL LOW (ref 135–145)
Total Bilirubin: 0.4 mg/dL (ref 0.2–1.2)
Total Protein: 6.3 g/dL (ref 6.0–8.3)

## 2019-01-05 LAB — CBC
HCT: 35.7 % — ABNORMAL LOW (ref 36.0–46.0)
Hemoglobin: 11.6 g/dL — ABNORMAL LOW (ref 12.0–15.0)
MCHC: 32.4 g/dL (ref 30.0–36.0)
MCV: 80.5 fl (ref 78.0–100.0)
Platelets: 292 10*3/uL (ref 150.0–400.0)
RBC: 4.44 Mil/uL (ref 3.87–5.11)
RDW: 15.3 % (ref 11.5–15.5)
WBC: 9.2 10*3/uL (ref 4.0–10.5)

## 2019-01-05 LAB — MICROALBUMIN / CREATININE URINE RATIO
Creatinine,U: 15.7 mg/dL
Microalb Creat Ratio: 4.5 mg/g (ref 0.0–30.0)
Microalb, Ur: <0.7 mg/dL (ref 0.0–1.9)

## 2019-01-05 LAB — POCT GLYCOSYLATED HEMOGLOBIN (HGB A1C): Hemoglobin A1C: 5.7 % — AB (ref 4.0–5.6)

## 2019-01-05 LAB — VITAMIN D 25 HYDROXY (VIT D DEFICIENCY, FRACTURES): VITD: 56.9 ng/mL (ref 30.00–100.00)

## 2019-01-05 MED ORDER — METOPROLOL TARTRATE 25 MG PO TABS: 12.5000 mg | ORAL_TABLET | Freq: Two times a day (BID) | ORAL | 3 refills | Status: DC

## 2019-01-05 NOTE — Telephone Encounter (Signed)
Please advise 

## 2019-01-06 ENCOUNTER — Other Ambulatory Visit: Payer: Self-pay

## 2019-01-06 ENCOUNTER — Encounter: Payer: Self-pay | Admitting: Family Medicine

## 2019-01-06 ENCOUNTER — Telehealth: Payer: Self-pay

## 2019-01-06 DIAGNOSIS — E222 Syndrome of inappropriate secretion of antidiuretic hormone: Secondary | ICD-10-CM

## 2019-01-06 NOTE — Telephone Encounter (Signed)
Copied from [place] [phone]. Topic: General - Other >> Jan 06, 2019 12:51 PM Keene Breath wrote: Reason for CRM: Patient is returning a call to the office.  She believes it is about blood test results.  CB# [phone].  Please call after 2pm

## 2019-01-07 ENCOUNTER — Encounter: Payer: Self-pay | Admitting: Family Medicine

## 2019-01-12 ENCOUNTER — Encounter (INDEPENDENT_AMBULATORY_CARE_PROVIDER_SITE_OTHER): Payer: Self-pay | Admitting: Physician Assistant

## 2019-01-12 ENCOUNTER — Encounter: Payer: Self-pay | Admitting: Family Medicine

## 2019-01-12 ENCOUNTER — Other Ambulatory Visit: Payer: Self-pay

## 2019-01-12 ENCOUNTER — Ambulatory Visit (INDEPENDENT_AMBULATORY_CARE_PROVIDER_SITE_OTHER): Payer: Medicare Other | Admitting: Physician Assistant

## 2019-01-12 VITALS — BP 109/78 | HR 48 | Temp 99.1°F | Ht 66.0 in | Wt 204.0 lb

## 2019-01-12 DIAGNOSIS — E119 Type 2 diabetes mellitus without complications: Secondary | ICD-10-CM

## 2019-01-12 DIAGNOSIS — E559 Vitamin D deficiency, unspecified: Secondary | ICD-10-CM

## 2019-01-12 DIAGNOSIS — E669 Obesity, unspecified: Secondary | ICD-10-CM | POA: Diagnosis not present

## 2019-01-12 DIAGNOSIS — Z6834 Body mass index (BMI) 34.0-34.9, adult: Secondary | ICD-10-CM | POA: Diagnosis not present

## 2019-01-13 NOTE — Progress Notes (Signed)
Office: [phone]  /  Fax: [phone]   HPI:   Chief Complaint: OBESITY [name] is here to discuss her progress with her obesity treatment plan. She is on the Category 3 plan and is following her eating plan approximately 95-98% of the time. She states she is walking at the grocery store. [name] is doing well overall on the plan. She states she has not struggled at all and her hunger is controlled.  Her weight is 204 lb (92.5 kg) today and has had a weight loss of 2 pounds over a period of 4 weeks since her last visit. She has lost 16 lbs since starting treatment with [name].  Diabetes II [name] has a diagnosis of diabetes type II and is on metformin. Zissy states fasting blood sugars range between 114 and 133. She denies any hypoglycemic episodes. Last A1c was 5.7 on 01/05/2019. She has been working on intensive lifestyle modifications including diet, exercise, and weight loss to help control her blood glucose levels. No nausea, vomiting, or diarrhea.  Vitamin D deficiency [name] has a diagnosis of Vitamin D deficiency. Her last level was at goal She is currently taking prescription Vit D and denies nausea, vomiting or muscle weakness.  At risk for osteopenia and osteoporosis [name] is at higher risk of osteopenia and osteoporosis due to Vitamin D deficiency.   ASSESSMENT AND PLAN:  Type 2 diabetes mellitus without complication, without long-term current use of insulin (HCC)  Vitamin D deficiency  Class 1 obesity with serious comorbidity and body mass index (BMI) of 34.0 to 34.9 in adult, unspecified obesity type  PLAN:  Diabetes II [name] has been given extensive diabetes education by myself today including ideal fasting and post-prandial blood glucose readings, individual ideal HgA1c goals  and hypoglycemia prevention. We discussed the importance of good blood sugar control to decrease the likelihood of diabetic complications such as nephropathy,  neuropathy, limb loss, blindness, coronary artery disease, and death. We discussed the importance of intensive lifestyle modification including diet, exercise and weight loss as the first line treatment for diabetes. Recie agrees to continue her diabetes medications and will follow-up at the agreed upon time.  Vitamin D Deficiency [name] was informed that low Vitamin D levels contributes to fatigue and are associated with obesity, breast, and colon cancer. She will change to OTC Vitamin D 5,000 units daily and discontinue weekly Vitamin D.  She will follow-up for routine testing of Vitamin D, at least 2-3 times per year. She was informed of the risk of over-replacement of Vitamin D and agrees to not increase her dose unless she discusses this with [name] first. [name] agrees to follow-up with our clinic in 2 weeks.  At risk for osteopenia and osteoporosis [name] was given extended  (15 minutes) osteoporosis prevention counseling today. [name] is at risk for osteopenia and osteoporsis due to her Vitamin D deficiency. She was encouraged to take her Vitamin D and follow her higher calcium diet and increase strengthening exercise to help strengthen her bones and decrease her risk of osteopenia and osteoporosis.  Obesity [name] is currently in the action stage of change. As such, her goal is to continue with weight loss efforts. She has agreed to follow the Category 3 plan. [name] has been instructed to work up to a goal of 150 minutes of combined cardio and strengthening exercise per week for weight loss and overall health benefits. We discussed the following Behavioral Modification Stratagies today: work on meal planning and easy cooking plans, and keeping healthy  foods in the home.  [name] has agreed to follow-up with our clinic in 2 weeks. She was informed of the importance of frequent follow-up visits to maximize her success with intensive lifestyle modifications for her  multiple health conditions.  ALLERGIES: Allergies  Allergen Reactions   Augmentin [Amoxicillin-Pot Clavulanate] Rash    rash   Erythromycin Nausea Only   Penicillins Itching and Rash    Has patient had a PCN reaction causing immediate rash, facial/tongue/throat swelling, SOB or lightheadedness with hypotension: No Has patient had a PCN reaction causing severe rash involving mucus membranes or skin necrosis: No Has patient had a PCN reaction that required hospitalization: No Has patient had a PCN reaction occurring within the last 10 years: No  If all of the above answers are "NO", then may proceed with Cephalosporin use.     MEDICATIONS: Current Outpatient Medications on File Prior to Visit  Medication Sig Dispense Refill   aspirin EC 81 MG tablet Take 1 tablet (81 mg total) by mouth daily. 90 tablet 3   atorvastatin (LIPITOR) 40 MG tablet TAKE 1 TABLET (40 MG TOTAL) BY MOUTH DAILY AT 6 PM. 90 tablet 3   Biotin 5000 MCG TABS Take 5,000 mcg by mouth daily.      calcium-vitamin D (OSCAL WITH D) 500-200 MG-UNIT per tablet Take 2 tablets by mouth daily.      clindamycin (CLEOCIN) 300 MG capsule TAKE 2 CAPSULES BY MOUTH 1 HOUR PRIOR TOO DENTAL CLEANING. 2 capsule 0   clopidogrel (PLAVIX) 75 MG tablet TAKE 1 TABLET BY MOUTH EVERY DAY 90 tablet 3   fluticasone (FLONASE) 50 MCG/ACT nasal spray Place 2 sprays into both nostrils daily.      Insulin Pen Needle (BD PEN NEEDLE NANO 2ND GEN) 32G X 4 MM MISC 1 Package by Does not apply route 2 (two) times daily. 100 each 0   ipratropium (ATROVENT) 0.03 % nasal spray Place 2 sprays into both nostrils every 12 (twelve) hours.     Lancets (ONETOUCH ULTRASOFT) lancets Use to test blood sugars daily. Dx: E11.9 100 each 12   loratadine (CLARITIN) 10 MG tablet Take 10 mg by mouth at bedtime.      Melatonin 5 MG TABS Take by mouth.     metFORMIN (GLUCOPHAGE) 500 MG tablet Take one tablet with breakfast and 1/2-1 with dinner 180 tablet 1    metoCLOPramide (REGLAN) 10 MG tablet TAKE 1/2 TABLET BY MOUTH 4 TIMES A DAY 60 tablet 5   metoprolol tartrate (LOPRESSOR) 25 MG tablet Take 0.5 tablets (12.5 mg total) by mouth 2 (two) times daily. 45 tablet 3   Multiple Vitamin (MULTIVITAMIN WITH MINERALS) TABS tablet Take 1 tablet by mouth daily.     naproxen sodium (ALEVE) 220 MG tablet Take 220 mg by mouth.     omeprazole (PRILOSEC OTC) 20 MG tablet Take 40 mg by mouth daily with breakfast.     ONETOUCH VERIO test strip USE TO TEST BLOOD SUGARS DAILY. 100 strip 4   Probiotic Product (ALIGN) 4 MG CAPS Take 4 mg by mouth daily.      psyllium (METAMUCIL SMOOTH TEXTURE) 28 % packet Take 1 packet by mouth daily before breakfast.      venlafaxine XR (EFFEXOR-XR) 75 MG 24 hr capsule Take 1 capsule (75 mg total) by mouth daily with breakfast. 90 capsule 2   No current facility-administered medications on file prior to visit.     PAST MEDICAL HISTORY: Past Medical History:  Diagnosis Date  Allergy    Anxiety    Arthritis    back & knee   Asthma     mild per pt shows up with resp illness   Chronic diastolic congestive heart failure (HCC)    Colon polyps    Constipation    Diabetes mellitus without complication (HCC)    Dyspnea    Gastric polyps    Gastroparesis    GERD (gastroesophageal reflux disease)    Headache    sinus headaches and migraines at times   Heart murmur    History of migraine headaches    HTN (hypertension)    Hyperlipidemia    IBS (irritable bowel syndrome)    Joint pain    Lumbar disc disease    S/P aortic valve replacement with bioprosthetic valve 08/23/2016   25 mm Edwards Intuity rapid-deployment bovine pericardial tissue valve via partial upper mini sternotomy   Sleep apnea    TIA (transient ischemic attack)     PAST SURGICAL HISTORY: Past Surgical History:  Procedure Laterality Date   ABDOMINAL HYSTERECTOMY     AORTIC VALVE REPLACEMENT N/A 08/23/2016   Procedure: AORTIC  VALVE REPLACEMENT (AVR) - using partial Upper Sternotomy- 40mm Edwards Intuity Aortic Valve used;  Surgeon: Rexene Alberts, MD;  Location: [facility];  Service: Open Heart Surgery;  Laterality: N/A;   BLADDER SUSPENSION  2007   BREAST ENHANCEMENT SURGERY  1980   CARPAL TUNNEL RELEASE Bilateral    COLONOSCOPY     DORSAL COMPARTMENT RELEASE Right 09/15/2014   Procedure: RIGHT WRIST DEQUERVAINS RELEASE ;  Surgeon: Kathryne Hitch, MD;  Location: [facility];  Service: Orthopedics;  Laterality: Right;   ESOPHAGOGASTRODUODENOSCOPY     KNEE ARTHROSCOPY  04/15/2012   Procedure: ARTHROSCOPY KNEE;  Surgeon: [name], MD;  Location: [facility];  Service: Orthopedics;  Laterality: Right;   LAPAROSCOPIC CHOLECYSTECTOMY     LASIK     OOPHORECTOMY     PALATE TO GINGIVA GRAFT  2017   RIGHT/LEFT HEART CATH AND CORONARY ANGIOGRAPHY N/A 08/02/2016   Procedure: Right/Left Heart Cath and Coronary Angiography;  Surgeon: Sherren Mocha, MD;  Location: [facility];  Service: Cardiovascular;  Laterality: N/A;   ROTATOR CUFF REPAIR Left    TEE WITHOUT CARDIOVERSION N/A 08/23/2016   Procedure: TRANSESOPHAGEAL ECHOCARDIOGRAM (TEE);  Surgeon: Rexene Alberts, MD;  Location: [facility];  Service: Open Heart Surgery;  Laterality: N/A;   TOTAL KNEE ARTHROPLASTY  04/15/2012   Procedure: TOTAL KNEE ARTHROPLASTY;  Surgeon: [name], MD;  Location: [facility];  Service: Orthopedics;  Laterality: Right;   TRIGGER FINGER RELEASE Right 04/20/2015   Procedure: RIGHT TRIGGER FINGER RELEASE (TENDON SHEALTH INCISION) ;  Surgeon: [name], MD;  Location: [facility];  Service: Orthopedics;  Laterality: Right;    SOCIAL HISTORY: Social History   Tobacco Use   Smoking status: Never Smoker   Smokeless tobacco: Never Used  Substance Use Topics   Alcohol use: No   Drug use: No    FAMILY HISTORY: Family History  Problem Relation Age of Onset   COPD Mother    Colon polyps  Mother    Irritable bowel syndrome Mother    Anxiety disorder Mother    Heart disease Father    Alcohol abuse Father    Colon polyps Maternal Aunt    ROS: Review of Systems  Gastrointestinal: Negative for diarrhea, nausea and vomiting.  Musculoskeletal:       Negative for muscle weakness.  Endo/Heme/Allergies:       Negative for hypoglycemia.   PHYSICAL EXAM: Blood pressure 109/78, pulse (!) 48, temperature 99.1 F (37.3 C), height 5\' 6"  (1.676 m), weight 204 lb (92.5 kg), SpO2 100 %. Body mass index is 32.93 kg/m. Physical Exam Vitals signs reviewed.  Constitutional:      Appearance: Normal appearance. She is obese.  Cardiovascular:     Rate and Rhythm: Normal rate.     Pulses: Normal pulses.  Pulmonary:     Effort: Pulmonary effort is normal.     Breath sounds: Normal breath sounds.  Musculoskeletal: Normal range of motion.  Skin:    General: Skin is warm and dry.  Neurological:     Mental Status: She is alert and oriented to person, place, and time.  Psychiatric:        Behavior: Behavior normal.   RECENT LABS AND TESTS: BMET    Component Value Date/Time   NA 128 (L) 01/05/2019 1028   NA 129 (L) 01/19/2018 0959   K 4.6 01/05/2019 1028   CL 96 01/05/2019 1028   CO2 25 01/05/2019 1028   GLUCOSE 99 01/05/2019 1028   BUN 23 01/05/2019 1028   BUN 16 01/19/2018 0959   CREATININE 0.55 01/05/2019 1028   CALCIUM 8.9 01/05/2019 1028   GFRNONAA 79 01/19/2018 0959   GFRAA 91 01/19/2018 0959   Lab Results  Component Value Date   HGBA1C 5.7 (A) 01/05/2019   HGBA1C 6.1 06/04/2018   HGBA1C 6.6 (H) 01/19/2018   HGBA1C 6.2 (A) 11/25/2017   HGBA1C 6.4 05/23/2017   Lab Results  Component Value Date   INSULIN 12.1 01/19/2018   CBC    Component Value Date/Time   WBC 9.2 01/05/2019 1028   RBC 4.44 01/05/2019 1028   HGB 11.6 (L) 01/05/2019 1028   HGB 11.7 01/19/2018 0959   HCT 35.7 (L) 01/05/2019 1028   HCT 35.7 01/19/2018 0959   PLT 292.0 01/05/2019 1028     PLT 358 04/24/2017 1231   MCV 80.5 01/05/2019 1028   MCV 81 01/19/2018 0959   MCH 26.5 (L) 01/19/2018 0959   MCH 27.4 08/26/2016 0248   MCHC 32.4 01/05/2019 1028   RDW 15.3 01/05/2019 1028   RDW 14.9 01/19/2018 0959   LYMPHSABS 2.9 01/19/2018 0959   MONOABS 0.6 05/11/2015 0836   EOSABS 0.1 01/19/2018 0959   BASOSABS 0.0 01/19/2018 0959   Iron/TIBC/Ferritin/ %Sat No results found for: IRON, TIBC, FERRITIN, IRONPCTSAT Lipid Panel     Component Value Date/Time   CHOL 116 06/04/2018 1012   CHOL 123 01/19/2018 0959   TRIG 70.0 06/04/2018 1012   HDL 47.40 06/04/2018 1012   HDL 54 01/19/2018 0959   CHOLHDL 2 06/04/2018 1012   VLDL 14.0 06/04/2018 1012   LDLCALC 54 06/04/2018 1012   LDLCALC 54 01/19/2018 0959   LDLDIRECT 58.0 11/25/2017 1156   Hepatic Function Panel     Component Value Date/Time   PROT 6.3 01/05/2019 1028   PROT 6.6 01/19/2018 0959   ALBUMIN 4.4 01/05/2019 1028   ALBUMIN 4.6 01/19/2018 0959   AST 22 01/05/2019 1028   ALT 14 01/05/2019 1028   ALKPHOS 128 (H) 01/05/2019 1028   BILITOT 0.4 01/05/2019 1028   BILITOT 0.5 01/19/2018 0959   BILIDIR 0.1 05/11/2015 0836      Component Value Date/Time   TSH 3.38 06/04/2018 1012   TSH 5.300 (H) 01/19/2018 0959   TSH 3.390 04/24/2017 1231   Results for Sciara, Danea P (MRN  [id number]) as of 01/13/2019 09:27  Ref. Range 01/19/2018 09:59  Vitamin D, 25-Hydroxy Latest Ref Range: 30.0 - 100.0 ng/mL 27.9 (L)   OBESITY BEHAVIORAL INTERVENTION VISIT  Today's visit was #22  Starting weight: 220 lbs Starting date: 01/19/2018 Today's weight: 204 lbs  Today's date: 01/12/2019 Total lbs lost to date: 16   01/12/2019  Height 5\' 6"  (1.676 m)  Weight 204 lb (92.5 kg)  BMI (Calculated) 32.94  BLOOD PRESSURE - SYSTOLIC 631  BLOOD PRESSURE - DIASTOLIC 78   Body Fat % 47 %  Total Body Water (lbs) 75.61 lbs   ASK: We discussed the diagnosis of obesity with Ica P Hufford today and Legend agreed to give [name]  permission to discuss obesity behavioral modification therapy today.  ASSESS: Vadis has the diagnosis of obesity and her BMI today is 34.1. Tamanna is in the action stage of change.   ADVISE: Jacquel was educated on the multiple health risks of obesity as well as the benefit of weight loss to improve her health. She was advised of the need for long term treatment and the importance of lifestyle modifications to improve her current health and to decrease her risk of future health problems.  AGREE: Multiple dietary modification options and treatment options were discussed and  Zali agreed to follow the recommendations documented in the above note.  ARRANGE: Francia was educated on the importance of frequent visits to treat obesity as outlined per CMS and USPSTF guidelines and agreed to schedule her next follow up appointment today.  Migdalia Dk, am acting as transcriptionist for [name], PA-C I, [name], PA-C have reviewed above note and agree with its content

## 2019-01-18 ENCOUNTER — Other Ambulatory Visit: Payer: Self-pay

## 2019-01-20 ENCOUNTER — Encounter: Payer: Self-pay | Admitting: Endocrinology

## 2019-01-20 ENCOUNTER — Other Ambulatory Visit: Payer: Self-pay

## 2019-01-20 ENCOUNTER — Ambulatory Visit (INDEPENDENT_AMBULATORY_CARE_PROVIDER_SITE_OTHER): Payer: Medicare Other | Admitting: Endocrinology

## 2019-01-20 VITALS — BP 132/62 | HR 53 | Ht 66.0 in | Wt 210.2 lb

## 2019-01-20 DIAGNOSIS — E871 Hypo-osmolality and hyponatremia: Secondary | ICD-10-CM

## 2019-01-20 MED ORDER — COSYNTROPIN 0.25 MG IJ SOLR
0.2500 mg | Freq: Once | INTRAMUSCULAR | Status: AC
  Administered 2019-01-20: 0.25 mg via INTRAMUSCULAR

## 2019-01-20 MED ORDER — DEMECLOCYCLINE HCL 300 MG PO TABS: 300.0000 mg | ORAL_TABLET | Freq: Every day | ORAL | 11 refills | Status: DC

## 2019-01-20 NOTE — Patient Instructions (Addendum)
Please try to reduce your fluid intake to 2 quarts per day.  The easiest way to do [name] is to not drink during meals.   I have sent a prescription to your pharmacy, to add "demeclocycline."  If this is too expensive, we'll change to salt tablets, to encourage your body to eliminate more water.  Please come back for a follow-up appointment in 1 month.

## 2019-01-20 NOTE — Progress Notes (Signed)
Subjective:    Patient ID: [name], female    DOB: 09/26/1950, 68 y.o.   MRN: [id number]  HPI Pt is referred by Dr Yong Channel, for hyponatremia.  Pt was first noted to have hyponatremia in 2013.  She denies ACEI, duiretic, adrenal insuff, cirrhosis, CHF, hypothyroidism, nephrotic synd, chronic diarrhea, chronic severe pain, brain injury, hypertriglyceridemia, psychogenic polydipsia, pulm dz, chemotx, narcotics, or GBS.  She reports slight weight loss, due to her efforts, and assoc polyiria.  She is on a new diet, which involves drinking lots of fluids.   Past Medical History:  Diagnosis Date  . Allergy   . Anxiety   . Arthritis    back & knee  . Asthma     mild per pt shows up with resp illness  . Chronic diastolic congestive heart failure ([place])   . Colon polyps   . Constipation   . Diabetes mellitus without complication ([place])   . Dyspnea   . Gastric polyps   . Gastroparesis   . GERD (gastroesophageal reflux disease)   . Headache    sinus headaches and migraines at times  . Heart murmur   . History of migraine headaches   . HTN (hypertension)   . Hyperlipidemia   . IBS (irritable bowel syndrome)   . Joint pain   . Lumbar disc disease   . S/P aortic valve replacement with bioprosthetic valve 08/23/2016   25 mm Edwards Intuity rapid-deployment bovine pericardial tissue valve via partial upper mini sternotomy  . Sleep apnea   . TIA (transient ischemic attack)     Past Surgical History:  Procedure Laterality Date  . ABDOMINAL HYSTERECTOMY    . AORTIC VALVE REPLACEMENT N/A 08/23/2016   Procedure: AORTIC VALVE REPLACEMENT (AVR) - using partial Upper Sternotomy- 19mm Edwards Intuity Aortic Valve used;  Surgeon: Rexene Alberts, MD;  Location: [facility];  Service: Open Heart Surgery;  Laterality: N/A;  . BLADDER SUSPENSION  2007  . BREAST ENHANCEMENT SURGERY  1980  . CARPAL TUNNEL RELEASE Bilateral   . COLONOSCOPY    . DORSAL COMPARTMENT RELEASE Right 09/15/2014   Procedure:  RIGHT WRIST DEQUERVAINS RELEASE ;  Surgeon: Kathryne Hitch, MD;  Location: [facility];  Service: Orthopedics;  Laterality: Right;  . ESOPHAGOGASTRODUODENOSCOPY    . KNEE ARTHROSCOPY  04/15/2012   Procedure: ARTHROSCOPY KNEE;  Surgeon: [name], MD;  Location: [facility];  Service: Orthopedics;  Laterality: Right;  . LAPAROSCOPIC CHOLECYSTECTOMY    . LASIK    . OOPHORECTOMY    . PALATE TO GINGIVA GRAFT  2017  . RIGHT/LEFT HEART CATH AND CORONARY ANGIOGRAPHY N/A 08/02/2016   Procedure: Right/Left Heart Cath and Coronary Angiography;  Surgeon: Sherren Mocha, MD;  Location: [facility];  Service: Cardiovascular;  Laterality: N/A;  . ROTATOR CUFF REPAIR Left   . TEE WITHOUT CARDIOVERSION N/A 08/23/2016   Procedure: TRANSESOPHAGEAL ECHOCARDIOGRAM (TEE);  Surgeon: Rexene Alberts, MD;  Location: [facility];  Service: Open Heart Surgery;  Laterality: N/A;  . TOTAL KNEE ARTHROPLASTY  04/15/2012   Procedure: TOTAL KNEE ARTHROPLASTY;  Surgeon: [name], MD;  Location: [facility];  Service: Orthopedics;  Laterality: Right;  . TRIGGER FINGER RELEASE Right 04/20/2015   Procedure: RIGHT TRIGGER FINGER RELEASE (TENDON SHEALTH INCISION) ;  Surgeon: [name], MD;  Location: [facility];  Service: Orthopedics;  Laterality: Right;    Social History   Socioeconomic History  . Marital status: Married  Spouse name: [name]  . Number of children: 1  . Years of education: Not on file  . Highest education level: Not on file  Occupational History  . Occupation: Retired, Curator: RETIRED  Social Needs  . Financial resource strain: Not on file  . Food insecurity    Worry: Not on file    Inability: Not on file  . Transportation needs    Medical: Not on file    Non-medical: Not on file  Tobacco Use  . Smoking status: Never Smoker  . Smokeless tobacco: Never Used  Substance and Sexual Activity  . Alcohol use: No  . Drug use: No  . Sexual  activity: Yes  Lifestyle  . Physical activity    Days per week: Not on file    Minutes per session: Not on file  . Stress: Not on file  Relationships  . Social Herbalist on phone: Not on file    Gets together: Not on file    Attends religious service: Not on file    Active member of club or organization: Not on file    Attends meetings of clubs or organizations: Not on file    Relationship status: Not on file  . Intimate partner violence    Fear of current or ex partner: Not on file    Emotionally abused: Not on file    Physically abused: Not on file    Forced sexual activity: Not on file  Other Topics Concern  . Not on file  Social History Narrative   She lives with husband (1978) and two dogs. Step-son [name] (1971-psychiatrist in [place]). No grandkids. 2 dogs-sheltie/collie mix and border collie mix      Highest level of education:  Master in education   She is retired Animal nutritionist x 32 years.      Hobbies: time with dogs             Current Outpatient Medications on File Prior to Visit  Medication Sig Dispense Refill  . aspirin EC 81 MG tablet Take 1 tablet (81 mg total) by mouth daily. 90 tablet 3  . atorvastatin (LIPITOR) 40 MG tablet TAKE 1 TABLET (40 MG TOTAL) BY MOUTH DAILY AT 6 PM. 90 tablet 3  . Biotin 5000 MCG TABS Take 5,000 mcg by mouth daily.     . calcium-vitamin D (OSCAL WITH D) 500-200 MG-UNIT per tablet Take 2 tablets by mouth daily.     . clindamycin (CLEOCIN) 300 MG capsule TAKE 2 CAPSULES BY MOUTH 1 HOUR PRIOR TOO DENTAL CLEANING. 2 capsule 0  . clopidogrel (PLAVIX) 75 MG tablet TAKE 1 TABLET BY MOUTH EVERY DAY 90 tablet 3  . fluticasone (FLONASE) 50 MCG/ACT nasal spray Place 2 sprays into both nostrils daily.     . Insulin Pen Needle (BD PEN NEEDLE NANO 2ND GEN) 32G X 4 MM MISC 1 Package by Does not apply route 2 (two) times daily. 100 each 0  . ipratropium (ATROVENT) 0.03 % nasal spray Place 2 sprays into both nostrils every 12  (twelve) hours.    . Lancets (ONETOUCH ULTRASOFT) lancets Use to test blood sugars daily. Dx: E11.9 100 each 12  . loratadine (CLARITIN) 10 MG tablet Take 10 mg by mouth at bedtime.     . Melatonin 5 MG TABS Take by mouth.    . metFORMIN (GLUCOPHAGE) 500 MG tablet Take one tablet with breakfast and 1/2-1 with dinner (Patient taking  differently: Take one tablet with breakfast and 1 tablet with dinner) 180 tablet 1  . metoCLOPramide (REGLAN) 10 MG tablet TAKE 1/2 TABLET BY MOUTH 4 TIMES A DAY 60 tablet 5  . metoprolol tartrate (LOPRESSOR) 25 MG tablet Take 0.5 tablets (12.5 mg total) by mouth 2 (two) times daily. 45 tablet 3  . Multiple Vitamin (MULTIVITAMIN WITH MINERALS) TABS tablet Take 1 tablet by mouth daily.    . naproxen sodium (ALEVE) 220 MG tablet Take 220 mg by mouth.    [name] omeprazole (PRILOSEC OTC) 20 MG tablet Take 40 mg by mouth daily with breakfast.    . ONETOUCH VERIO test strip USE TO TEST BLOOD SUGARS DAILY. 100 strip 4  . Probiotic Product (ALIGN) 4 MG CAPS Take 4 mg by mouth daily.     . psyllium (METAMUCIL SMOOTH TEXTURE) 28 % packet Take 1 packet by mouth daily before breakfast.     . venlafaxine XR (EFFEXOR-XR) 75 MG 24 hr capsule Take 1 capsule (75 mg total) by mouth daily with breakfast. 90 capsule 2   No current facility-administered medications on file prior to visit.     Allergies  Allergen Reactions  . Augmentin [Amoxicillin-Pot Clavulanate] Rash    rash  . Erythromycin Nausea Only  . Penicillins Itching and Rash    Has patient had a PCN reaction causing immediate rash, facial/tongue/throat swelling, SOB or lightheadedness with hypotension: No Has patient had a PCN reaction causing severe rash involving mucus membranes or skin necrosis: No Has patient had a PCN reaction that required hospitalization: No Has patient had a PCN reaction occurring within the last 10 years: No  If all of the above answers are "NO", then may proceed with Cephalosporin use.      Family History  Problem Relation Age of Onset  . COPD Mother   . Colon polyps Mother   . Irritable bowel syndrome Mother   . Anxiety disorder Mother   . Heart disease Father   . Alcohol abuse Father   . Colon polyps Maternal Aunt   . Other Neg Hx        hyponatremia    BP 132/62 (BP Location: Left Arm, Patient Position: Sitting, Cuff Size: Large)   Pulse (!) 53   Ht 5\' 6"  (1.676 m)   Wt 210 lb 3.2 oz (95.3 kg)   LMP  (LMP Unknown)   SpO2 97%   BMI 33.93 kg/m    Review of Systems Denies fever, numbness, visual loss, sob, palpitations, n/v, nocturia, open wound, memory loss, back pain, rhinorrhea, cold intolerance, and headache.  She has chronic hearing loss.       Objective:   Physical Exam VS: see vs page GEN: no distress HEAD: head: no deformity eyes: no periorbital swelling, no proptosis external nose and ears are normal mouth: no lesion seen NECK: supple, thyroid is not enlarged CHEST WALL: no deformity.  Old healed surgical scar LUNGS: clear to auscultation CV: reg rate and rhythm, no murmur ABD: abdomen is soft, nontender.  no hepatosplenomegaly.  not distended.  no hernia MUSCULOSKELETAL: muscle bulk and strength are grossly normal.  no obvious joint swelling.  gait is normal and steady EXTEMITIES: no deformity.  no ulcer on the feet.  feet are of normal color and temp.  no edema PULSES: dorsalis pedis intact bilat.  no carotid bruit NEURO:  cn 2-12 grossly intact.   readily moves all 4's.  sensation is intact to touch on the feet SKIN:  Normal texture and  temperature.  No rash or suspicious lesion is visible.   NODES:  None palpable at the neck PSYCH: alert, well-oriented.  Does not appear anxious nor depressed.    Lab Results  Component Value Date   CREATININE 0.55 01/05/2019   BUN 23 01/05/2019   NA 128 (L) 01/05/2019   K 4.6 01/05/2019   CL 96 01/05/2019   CO2 25 01/05/2019    Lab Results  Component Value Date   TSH 3.38 06/04/2018   T3TOTAL 171  01/19/2018   Lab Results  Component Value Date   CHOL 116 06/04/2018   HDL 47.40 06/04/2018   LDLCALC 54 06/04/2018   LDLDIRECT 58.0 11/25/2017   TRIG 70.0 06/04/2018   CHOLHDL 2 06/04/2018    Urine Na+ is low.  I have reviewed outside records, and summarized: Pt was noted to have hyponatremia, and referred here. Venlafaxine was reduced, but this did not help hyponatremia.  However, it still works well for anxiety.       Assessment & Plan:  Hyponatremia, new to me.  Anxiety: venlafaxine is helping, so I would not d/c this.   Patient Instructions  Please try to reduce your fluid intake to 2 quarts per day.  The easiest way to do Amelia Jo is to not drink during meals.   I have sent a prescription to your pharmacy, to add "demeclocycline."  If this is too expensive, we'll change to salt tablets, to encourage your body to eliminate more water.  Please come back for a follow-up appointment in 1 month.

## 2019-01-21 ENCOUNTER — Encounter: Payer: Self-pay | Admitting: Family Medicine

## 2019-01-21 LAB — URINALYSIS, ROUTINE W REFLEX MICROSCOPIC
Bilirubin Urine: NEGATIVE
Hgb urine dipstick: NEGATIVE
Ketones, ur: NEGATIVE
Leukocytes,Ua: NEGATIVE
Nitrite: NEGATIVE
RBC / HPF: NONE SEEN (ref 0–?)
Specific Gravity, Urine: <=1.005 — AB (ref 1.000–1.030)
Total Protein, Urine: NEGATIVE
Urine Glucose: NEGATIVE
Urobilinogen, UA: 0.2 (ref 0.0–1.0)
pH: 6 (ref 5.0–8.0)

## 2019-01-21 LAB — BASIC METABOLIC PANEL
BUN: 20 mg/dL (ref 6–23)
CO2: 25 mEq/L (ref 19–32)
Calcium: 9.6 mg/dL (ref 8.4–10.5)
Chloride: 97 mEq/L (ref 96–112)
Creatinine, Ser: 0.69 mg/dL (ref 0.40–1.20)
GFR: 84.57 mL/min (ref >60.00)
Glucose, Bld: 86 mg/dL (ref 70–99)
Potassium: 4.6 mEq/L (ref 3.5–5.1)
Sodium: 130 mEq/L — ABNORMAL LOW (ref 135–145)

## 2019-01-21 LAB — CORTISOL
Cortisol, Plasma: 5.7 ug/dL
Cortisol, Plasma: 22.5 ug/dL

## 2019-01-22 LAB — PROTEIN ELECTROPHORESIS, SERUM
Albumin ELP: 4 g/dL (ref 3.8–4.8)
Alpha 1: 0.3 g/dL (ref 0.2–0.3)
Alpha 2: 0.5 g/dL (ref 0.5–0.9)
Beta 2: 0.3 g/dL (ref 0.2–0.5)
Beta Globulin: 0.5 g/dL (ref 0.4–0.6)
Gamma Globulin: 0.7 g/dL — ABNORMAL LOW (ref 0.8–1.7)
Total Protein: 6.3 g/dL (ref 6.1–8.1)

## 2019-01-24 ENCOUNTER — Other Ambulatory Visit (INDEPENDENT_AMBULATORY_CARE_PROVIDER_SITE_OTHER): Payer: Self-pay | Admitting: Physician Assistant

## 2019-01-24 DIAGNOSIS — E559 Vitamin D deficiency, unspecified: Secondary | ICD-10-CM

## 2019-01-28 ENCOUNTER — Ambulatory Visit (INDEPENDENT_AMBULATORY_CARE_PROVIDER_SITE_OTHER): Payer: Medicare Other | Admitting: Physician Assistant

## 2019-01-28 ENCOUNTER — Encounter (INDEPENDENT_AMBULATORY_CARE_PROVIDER_SITE_OTHER): Payer: Self-pay | Admitting: Physician Assistant

## 2019-01-28 ENCOUNTER — Other Ambulatory Visit: Payer: Self-pay

## 2019-01-28 VITALS — BP 152/54 | HR 58 | Temp 98.7°F | Ht 66.0 in | Wt 204.0 lb

## 2019-01-28 DIAGNOSIS — E669 Obesity, unspecified: Secondary | ICD-10-CM

## 2019-01-28 DIAGNOSIS — Z6833 Body mass index (BMI) 33.0-33.9, adult: Secondary | ICD-10-CM

## 2019-01-28 DIAGNOSIS — E119 Type 2 diabetes mellitus without complications: Secondary | ICD-10-CM | POA: Diagnosis not present

## 2019-01-28 LAB — ARGININE VASOPRESSIN HORMONE
ADH: <0.8 pg/mL (ref 0.0–4.7)
Osmolality Meas: 268 mOsmol/kg — ABNORMAL LOW (ref 280–301)

## 2019-01-28 NOTE — Progress Notes (Signed)
Office: [phone]  /  Fax: [phone]   HPI:   Chief Complaint: OBESITY [name] is here to discuss her progress with her obesity treatment plan. She is on the Category 3 plan and is following her eating plan approximately 95 % of the time. She states she is exercising 0 minutes 0 times per week. [name] reports that she is doing well with the plan. She was told by her endocrinologist to decrease her water intake to two quarts daily, due to having a decreased sodium level and she was started on a medication for SIADH. Her weight is 204 lb (92.5 kg) today and she has maintained weight since her last visit. She has lost 16 lbs since starting treatment with [name].  Diabetes II [name] has a diagnosis of diabetes type II. [name] states fasting BGs range between 121 and 137. [name] is on metformin and she denies nausea, vomiting or diarrhea. She has been working on intensive lifestyle modifications including diet, exercise, and weight loss to help control her blood glucose levels.  ASSESSMENT AND PLAN:  Type 2 diabetes mellitus without complication, without long-term current use of insulin (HCC)  Class 1 obesity with serious comorbidity and body mass index (BMI) of 33.0 to 33.9 in adult, unspecified obesity type  PLAN:  Diabetes II [name] has been given extensive diabetes education by myself today including ideal fasting and post-prandial blood glucose readings, individual ideal Hgb A1c goals and hypoglycemia prevention. We discussed the importance of good blood sugar control to decrease the likelihood of diabetic complications such as nephropathy, neuropathy, limb loss, blindness, coronary artery disease, and death. We discussed the importance of intensive lifestyle modification including diet, exercise and weight loss as the first line treatment for diabetes. [name] agrees to continue with medications and weight loss. [name] will follow up at the agreed upon  time.  Obesity [name] is currently in the action stage of change. As such, her goal is to continue with weight loss efforts She has agreed to follow the Category 3 plan [name] has been instructed to work up to a goal of 150 minutes of combined cardio and strengthening exercise per week for weight loss and overall health benefits. We discussed the following Behavioral Modification Strategies today: keeping healthy foods in the home and work on meal planning and easy cooking plans  We will check indirect calorimetry at the next visit.  [name] has agreed to follow up with our clinic in 2 to 3 weeks. She was informed of the importance of frequent follow up visits to maximize her success with intensive lifestyle modifications for her multiple health conditions.  ALLERGIES: Allergies  Allergen Reactions   Augmentin [Amoxicillin-Pot Clavulanate] Rash    rash   Erythromycin Nausea Only   Penicillins Itching and Rash    Has patient had a PCN reaction causing immediate rash, facial/tongue/throat swelling, SOB or lightheadedness with hypotension: No Has patient had a PCN reaction causing severe rash involving mucus membranes or skin necrosis: No Has patient had a PCN reaction that required hospitalization: No Has patient had a PCN reaction occurring within the last 10 years: No  If all of the above answers are "NO", then may proceed with Cephalosporin use.     MEDICATIONS: Current Outpatient Medications on File Prior to Visit  Medication Sig Dispense Refill   aspirin EC 81 MG tablet Take 1 tablet (81 mg total) by mouth daily. 90 tablet 3   atorvastatin (LIPITOR) 40 MG tablet TAKE 1 TABLET (40 MG TOTAL) BY MOUTH  DAILY AT 6 PM. 90 tablet 3   Biotin 5000 MCG TABS Take 5,000 mcg by mouth daily.      calcium-vitamin D (OSCAL WITH D) 500-200 MG-UNIT per tablet Take 2 tablets by mouth daily.      clindamycin (CLEOCIN) 300 MG capsule TAKE 2 CAPSULES BY MOUTH 1 HOUR PRIOR TOO DENTAL  CLEANING. 2 capsule 0   clopidogrel (PLAVIX) 75 MG tablet TAKE 1 TABLET BY MOUTH EVERY DAY 90 tablet 3   demeclocycline (DECLOMYCIN) 300 MG tablet Take 1 tablet (300 mg total) by mouth daily. 30 tablet 11   fluticasone (FLONASE) 50 MCG/ACT nasal spray Place 2 sprays into both nostrils daily.      Insulin Pen Needle (BD PEN NEEDLE NANO 2ND GEN) 32G X 4 MM MISC 1 Package by Does not apply route 2 (two) times daily. 100 each 0   ipratropium (ATROVENT) 0.03 % nasal spray Place 2 sprays into both nostrils every 12 (twelve) hours.     Lancets (ONETOUCH ULTRASOFT) lancets Use to test blood sugars daily. Dx: E11.9 100 each 12   loratadine (CLARITIN) 10 MG tablet Take 10 mg by mouth at bedtime.      Melatonin 5 MG TABS Take by mouth.     metFORMIN (GLUCOPHAGE) 500 MG tablet Take one tablet with breakfast and 1/2-1 with dinner (Patient taking differently: Take one tablet with breakfast and 1 tablet with dinner) 180 tablet 1   metoCLOPramide (REGLAN) 10 MG tablet TAKE 1/2 TABLET BY MOUTH 4 TIMES A DAY 60 tablet 5   metoprolol tartrate (LOPRESSOR) 25 MG tablet Take 0.5 tablets (12.5 mg total) by mouth 2 (two) times daily. 45 tablet 3   Multiple Vitamin (MULTIVITAMIN WITH MINERALS) TABS tablet Take 1 tablet by mouth daily.     naproxen sodium (ALEVE) 220 MG tablet Take 220 mg by mouth.     omeprazole (PRILOSEC OTC) 20 MG tablet Take 40 mg by mouth daily with breakfast.     ONETOUCH VERIO test strip USE TO TEST BLOOD SUGARS DAILY. 100 strip 4   Probiotic Product (ALIGN) 4 MG CAPS Take 4 mg by mouth daily.      psyllium (METAMUCIL SMOOTH TEXTURE) 28 % packet Take 1 packet by mouth daily before breakfast.      venlafaxine XR (EFFEXOR-XR) 75 MG 24 hr capsule Take 1 capsule (75 mg total) by mouth daily with breakfast. 90 capsule 2   No current facility-administered medications on file prior to visit.     PAST MEDICAL HISTORY: Past Medical History:  Diagnosis Date   Allergy    Anxiety      Arthritis    back & knee   Asthma     mild per pt shows up with resp illness   Chronic diastolic congestive heart failure (HCC)    Colon polyps    Constipation    Diabetes mellitus without complication (HCC)    Dyspnea    Gastric polyps    Gastroparesis    GERD (gastroesophageal reflux disease)    Headache    sinus headaches and migraines at times   Heart murmur    History of migraine headaches    HTN (hypertension)    Hyperlipidemia    IBS (irritable bowel syndrome)    Joint pain    Lumbar disc disease    S/P aortic valve replacement with bioprosthetic valve 08/23/2016   25 mm Edwards Intuity rapid-deployment bovine pericardial tissue valve via partial upper mini sternotomy   Sleep apnea  TIA (transient ischemic attack)     PAST SURGICAL HISTORY: Past Surgical History:  Procedure Laterality Date   ABDOMINAL HYSTERECTOMY     AORTIC VALVE REPLACEMENT N/A 08/23/2016   Procedure: AORTIC VALVE REPLACEMENT (AVR) - using partial Upper Sternotomy- 54mm Edwards Intuity Aortic Valve used;  Surgeon: Rexene Alberts, MD;  Location: [facility];  Service: Open Heart Surgery;  Laterality: N/A;   BLADDER SUSPENSION  2007   BREAST ENHANCEMENT SURGERY  1980   CARPAL TUNNEL RELEASE Bilateral    COLONOSCOPY     DORSAL COMPARTMENT RELEASE Right 09/15/2014   Procedure: RIGHT WRIST DEQUERVAINS RELEASE ;  Surgeon: Kathryne Hitch, MD;  Location: [facility];  Service: Orthopedics;  Laterality: Right;   ESOPHAGOGASTRODUODENOSCOPY     KNEE ARTHROSCOPY  04/15/2012   Procedure: ARTHROSCOPY KNEE;  Surgeon: [name], MD;  Location: [facility];  Service: Orthopedics;  Laterality: Right;   LAPAROSCOPIC CHOLECYSTECTOMY     LASIK     OOPHORECTOMY     PALATE TO GINGIVA GRAFT  2017   RIGHT/LEFT HEART CATH AND CORONARY ANGIOGRAPHY N/A 08/02/2016   Procedure: Right/Left Heart Cath and Coronary Angiography;  Surgeon: Sherren Mocha, MD;  Location: [facility];  Service: Cardiovascular;  Laterality: N/A;   ROTATOR CUFF REPAIR Left    TEE WITHOUT CARDIOVERSION N/A 08/23/2016   Procedure: TRANSESOPHAGEAL ECHOCARDIOGRAM (TEE);  Surgeon: Rexene Alberts, MD;  Location: [name];  Service: Open Heart Surgery;  Laterality: N/A;   TOTAL KNEE ARTHROPLASTY  04/15/2012   Procedure: TOTAL KNEE ARTHROPLASTY;  Surgeon: [name], MD;  Location: [facility];  Service: Orthopedics;  Laterality: Right;   TRIGGER FINGER RELEASE Right 04/20/2015   Procedure: RIGHT TRIGGER FINGER RELEASE (TENDON SHEALTH INCISION) ;  Surgeon: [name], MD;  Location: [facility];  Service: Orthopedics;  Laterality: Right;    SOCIAL HISTORY: Social History   Tobacco Use   Smoking status: Never Smoker   Smokeless tobacco: Never Used  Substance Use Topics   Alcohol use: No   Drug use: No    FAMILY HISTORY: Family History  Problem Relation Age of Onset   COPD Mother    Colon polyps Mother    Irritable bowel syndrome Mother    Anxiety disorder Mother    Heart disease Father    Alcohol abuse Father    Colon polyps Maternal Aunt    Other Neg Hx        hyponatremia    ROS: Review of Systems  Constitutional: Negative for weight loss.  Gastrointestinal: Negative for diarrhea, nausea and vomiting.    PHYSICAL EXAM: Blood pressure (!) 152/54, pulse (!) 58, temperature 98.7 F (37.1 C), temperature source Oral, height 5\' 6"  (1.676 m), weight 204 lb (92.5 kg), SpO2 97 %. Body mass index is 32.93 kg/m. Physical Exam Vitals signs reviewed.  Constitutional:      Appearance: Normal appearance. She is well-developed. She is obese.  Cardiovascular:     Rate and Rhythm: Normal rate.  Pulmonary:     Effort: Pulmonary effort is normal.  Musculoskeletal: Normal range of motion.  Skin:    General: Skin is warm and dry.  Neurological:     Mental Status: She is alert and oriented to person, place, and time.  Psychiatric:        Mood and  Affect: Mood normal.        Behavior: Behavior normal.     RECENT LABS AND TESTS: BMET  Component Value Date/Time   NA 130 (L) 01/20/2019 1543   NA 129 (L) 01/19/2018 0959   K 4.6 01/20/2019 1543   CL 97 01/20/2019 1543   CO2 25 01/20/2019 1543   GLUCOSE 86 01/20/2019 1543   BUN 20 01/20/2019 1543   BUN 16 01/19/2018 0959   CREATININE 0.69 01/20/2019 1543   CALCIUM 9.6 01/20/2019 1543   GFRNONAA 79 01/19/2018 0959   GFRAA 91 01/19/2018 0959   Lab Results  Component Value Date   HGBA1C 5.7 (A) 01/05/2019   HGBA1C 6.1 06/04/2018   HGBA1C 6.6 (H) 01/19/2018   HGBA1C 6.2 (A) 11/25/2017   HGBA1C 6.4 05/23/2017   Lab Results  Component Value Date   INSULIN 12.1 01/19/2018   CBC    Component Value Date/Time   WBC 9.2 01/05/2019 1028   RBC 4.44 01/05/2019 1028   HGB 11.6 (L) 01/05/2019 1028   HGB 11.7 01/19/2018 0959   HCT 35.7 (L) 01/05/2019 1028   HCT 35.7 01/19/2018 0959   PLT 292.0 01/05/2019 1028   PLT 358 04/24/2017 1231   MCV 80.5 01/05/2019 1028   MCV 81 01/19/2018 0959   MCH 26.5 (L) 01/19/2018 0959   MCH 27.4 08/26/2016 0248   MCHC 32.4 01/05/2019 1028   RDW 15.3 01/05/2019 1028   RDW 14.9 01/19/2018 0959   LYMPHSABS 2.9 01/19/2018 0959   MONOABS 0.6 05/11/2015 0836   EOSABS 0.1 01/19/2018 0959   BASOSABS 0.0 01/19/2018 0959   Iron/TIBC/Ferritin/ %Sat No results found for: IRON, TIBC, FERRITIN, IRONPCTSAT Lipid Panel     Component Value Date/Time   CHOL 116 06/04/2018 1012   CHOL 123 01/19/2018 0959   TRIG 70.0 06/04/2018 1012   HDL 47.40 06/04/2018 1012   HDL 54 01/19/2018 0959   CHOLHDL 2 06/04/2018 1012   VLDL 14.0 06/04/2018 1012   LDLCALC 54 06/04/2018 1012   LDLCALC 54 01/19/2018 0959   LDLDIRECT 58.0 11/25/2017 1156   Hepatic Function Panel     Component Value Date/Time   PROT 6.3 01/20/2019 1543   PROT 6.6 01/19/2018 0959   ALBUMIN 4.4 01/05/2019 1028   ALBUMIN 4.6 01/19/2018 0959   AST 22 01/05/2019 1028   ALT 14 01/05/2019  1028   ALKPHOS 128 (H) 01/05/2019 1028   BILITOT 0.4 01/05/2019 1028   BILITOT 0.5 01/19/2018 0959   BILIDIR 0.1 05/11/2015 0836      Component Value Date/Time   TSH 3.38 06/04/2018 1012   TSH 5.300 (H) 01/19/2018 0959   TSH 3.390 04/24/2017 1231    Results for RIELLE, SCHLAUCH (MRN [id number]) as of 01/28/2019 15:32  Ref. Range 01/05/2019 10:28  VITD Latest Ref Range: 30.00 - 100.00 ng/mL 56.90    OBESITY BEHAVIORAL INTERVENTION VISIT  Today's visit was # 23  Starting weight: 220 lbs Starting date: 01/19/2018 Today's weight : 204 lbs  Today's date: 01/28/2019 Total lbs lost to date: 16    01/28/2019  Height 5\' 6"  (1.676 m)  Weight 204 lb (92.5 kg)  BMI (Calculated) 32.94  BLOOD PRESSURE - SYSTOLIC 229  BLOOD PRESSURE - DIASTOLIC 54   Body Fat % 79.8 %  Total Body Water (lbs) 75.2 lbs    ASK: We discussed the diagnosis of obesity with [name] today and [name] agreed to give [name] permission to discuss obesity behavioral modification therapy today.  ASSESS: Keller has the diagnosis of obesity and her BMI today is 32.94 [name] is in the action stage of change   ADVISE: [name]  was educated on the multiple health risks of obesity as well as the benefit of weight loss to improve her health. She was advised of the need for long term treatment and the importance of lifestyle modifications to improve her current health and to decrease her risk of future health problems.  AGREE: Multiple dietary modification options and treatment options were discussed and  [name] agreed to follow the recommendations documented in the above note.  ARRANGE: [name] was educated on the importance of frequent visits to treat obesity as outlined per CMS and USPSTF guidelines and agreed to schedule her next follow up appointment today.  Corey Skains, am acting as transcriptionist for [name], PA-C I, [name], PA-C have reviewed above note and agree  with its content

## 2019-01-31 ENCOUNTER — Encounter: Payer: Self-pay | Admitting: Family Medicine

## 2019-02-11 ENCOUNTER — Ambulatory Visit (INDEPENDENT_AMBULATORY_CARE_PROVIDER_SITE_OTHER): Payer: Medicare Other | Admitting: Physician Assistant

## 2019-02-11 ENCOUNTER — Encounter (INDEPENDENT_AMBULATORY_CARE_PROVIDER_SITE_OTHER): Payer: Self-pay | Admitting: Physician Assistant

## 2019-02-11 ENCOUNTER — Other Ambulatory Visit: Payer: Self-pay

## 2019-02-11 VITALS — BP 133/49 | HR 56 | Temp 98.5°F | Ht 66.0 in | Wt 198.0 lb

## 2019-02-11 DIAGNOSIS — Z6832 Body mass index (BMI) 32.0-32.9, adult: Secondary | ICD-10-CM | POA: Diagnosis not present

## 2019-02-11 DIAGNOSIS — E7849 Other hyperlipidemia: Secondary | ICD-10-CM | POA: Diagnosis not present

## 2019-02-11 DIAGNOSIS — E669 Obesity, unspecified: Secondary | ICD-10-CM

## 2019-02-11 DIAGNOSIS — E119 Type 2 diabetes mellitus without complications: Secondary | ICD-10-CM

## 2019-02-11 DIAGNOSIS — E559 Vitamin D deficiency, unspecified: Secondary | ICD-10-CM | POA: Diagnosis not present

## 2019-02-11 NOTE — Progress Notes (Signed)
Office: [phone]  /  Fax: [phone]   HPI:   Chief Complaint: OBESITY Zona is here to discuss her progress with her obesity treatment plan. She is on the  follow the Category 3 plan and is following her eating plan approximately 95 % of the time. She states she is exercising 0 minutes 0 times per week. Oleta reports that she has not had any issue following the plan recently. She is making sure to get all of her protein in.  Her weight is 198 lb (89.8 kg) today and has had a weight loss of 6 pounds over a period of 2 weeks since her last visit. She has lost 22 lbs since starting treatment with [name].  Diabetes II Lunette has a diagnosis of diabetes type II. Eloise states fasting BGs range between 115 and 134 and denies any hypoglycemic episodes, nausea, vomiting, and diarrhea. Last A1c was 5.7. She has been working on intensive lifestyle modifications including diet, exercise, and weight loss to help control her blood glucose levels.  Hyperlipidemia Cornie has hyperlipidemia and has been trying to improve her cholesterol levels with intensive lifestyle modification including a low saturated fat diet, exercise and weight loss. She denies any chest pain, claudication or myalgias. She is currently taking atorvastatin.   Vitamin D deficiency Kalinda has a diagnosis of vitamin D deficiency. She is currently taking vit D OTC 5,000 IU daily and denies nausea, vomiting or muscle weakness.  ASSESSMENT AND PLAN:  Type 2 diabetes mellitus without complication, without long-term current use of insulin (HCC) - Plan: Comprehensive metabolic panel  Other hyperlipidemia - Plan: Lipid Panel With LDL/HDL Ratio  Vitamin D deficiency  Class 1 obesity with serious comorbidity and body mass index (BMI) of 32.0 to 32.9 in adult, unspecified obesity type  PLAN: Diabetes II Kiyona has been given extensive diabetes education by myself today including ideal fasting and  post-prandial blood glucose readings, individual ideal HgA1c goals  and hypoglycemia prevention. We discussed the importance of good blood sugar control to decrease the likelihood of diabetic complications such as nephropathy, neuropathy, limb loss, blindness, coronary artery disease, and death. We discussed the importance of intensive lifestyle modification including diet, exercise and weight loss as the first line treatment for diabetes. Bhavana agrees to continue her diabetes medications and will follow up at the agreed upon time. She need for hgA1c until 10/20.   Hyperlipidemia Margerite was informed of the American Heart Association Guidelines emphasizing intensive lifestyle modifications as the first line treatment for hyperlipidemia. We discussed many lifestyle modifications today in depth, and Tanasia will continue to work on decreasing saturated fats such as fatty red meat, butter and many fried foods. She will also increase vegetables and lean protein in her diet and continue to work on exercise and weight loss efforts.  Vitamin D Deficiency Anysia was informed that low vitamin D levels contributes to fatigue and are associated with obesity, breast, and colon cancer. She agrees to continue to take OTC vit D 5,000 IU daily and will follow up for routine testing of vitamin D, at least 2-3 times per year. She was informed of the risk of over-replacement of vitamin D and agrees to not increase her dose unless she discusses this with [name] first. Agrees to follow up with our clinic as directed.   Obesity Yazleen is currently in the action stage of change. As such, her goal is to continue with weight loss efforts She has agreed to follow the Category 3 plan Marquetta has  been instructed to work up to a goal of 150 minutes of combined cardio and strengthening exercise per week for weight loss and overall health benefits. We discussed the following Behavioral Modification Strategies today:  work on meal planning and easy cooking plans and keeping healthy foods in the home.    Soha has agreed to follow up with our clinic in 2 weeks. She was informed of the importance of frequent follow up visits to maximize her success with intensive lifestyle modifications for her multiple health conditions.  ALLERGIES: Allergies  Allergen Reactions   Augmentin [Amoxicillin-Pot Clavulanate] Rash    rash   Erythromycin Nausea Only   Penicillins Itching and Rash    Has patient had a PCN reaction causing immediate rash, facial/tongue/throat swelling, SOB or lightheadedness with hypotension: No Has patient had a PCN reaction causing severe rash involving mucus membranes or skin necrosis: No Has patient had a PCN reaction that required hospitalization: No Has patient had a PCN reaction occurring within the last 10 years: No  If all of the above answers are "NO", then may proceed with Cephalosporin use.     MEDICATIONS: Current Outpatient Medications on File Prior to Visit  Medication Sig Dispense Refill   Cholecalciferol (VITAMIN D) 125 MCG (5000 UT) CAPS Take 5,000 Units by mouth daily.     aspirin EC 81 MG tablet Take 1 tablet (81 mg total) by mouth daily. 90 tablet 3   atorvastatin (LIPITOR) 40 MG tablet TAKE 1 TABLET (40 MG TOTAL) BY MOUTH DAILY AT 6 PM. 90 tablet 3   Biotin 5000 MCG TABS Take 5,000 mcg by mouth daily.      calcium-vitamin D (OSCAL WITH D) 500-200 MG-UNIT per tablet Take 2 tablets by mouth daily.      clindamycin (CLEOCIN) 300 MG capsule TAKE 2 CAPSULES BY MOUTH 1 HOUR PRIOR TOO DENTAL CLEANING. 2 capsule 0   clopidogrel (PLAVIX) 75 MG tablet TAKE 1 TABLET BY MOUTH EVERY DAY 90 tablet 3   demeclocycline (DECLOMYCIN) 300 MG tablet Take 1 tablet (300 mg total) by mouth daily. 30 tablet 11   fluticasone (FLONASE) 50 MCG/ACT nasal spray Place 2 sprays into both nostrils daily.      Insulin Pen Needle (BD PEN NEEDLE NANO 2ND GEN) 32G X 4 MM MISC 1 Package by  Does not apply route 2 (two) times daily. 100 each 0   ipratropium (ATROVENT) 0.03 % nasal spray Place 2 sprays into both nostrils every 12 (twelve) hours.     Lancets (ONETOUCH ULTRASOFT) lancets Use to test blood sugars daily. Dx: E11.9 100 each 12   loratadine (CLARITIN) 10 MG tablet Take 10 mg by mouth at bedtime.      Melatonin 5 MG TABS Take by mouth.     metFORMIN (GLUCOPHAGE) 500 MG tablet Take one tablet with breakfast and 1/2-1 with dinner (Patient taking differently: Take one tablet with breakfast and 1 tablet with dinner) 180 tablet 1   metoCLOPramide (REGLAN) 10 MG tablet TAKE 1/2 TABLET BY MOUTH 4 TIMES A DAY 60 tablet 5   metoprolol tartrate (LOPRESSOR) 25 MG tablet Take 0.5 tablets (12.5 mg total) by mouth 2 (two) times daily. 45 tablet 3   Multiple Vitamin (MULTIVITAMIN WITH MINERALS) TABS tablet Take 1 tablet by mouth daily.     naproxen sodium (ALEVE) 220 MG tablet Take 220 mg by mouth.     omeprazole (PRILOSEC OTC) 20 MG tablet Take 40 mg by mouth daily with breakfast.     ONETOUCH  VERIO test strip USE TO TEST BLOOD SUGARS DAILY. 100 strip 4   Probiotic Product (ALIGN) 4 MG CAPS Take 4 mg by mouth daily.      psyllium (METAMUCIL SMOOTH TEXTURE) 28 % packet Take 1 packet by mouth daily before breakfast.      venlafaxine XR (EFFEXOR-XR) 75 MG 24 hr capsule Take 1 capsule (75 mg total) by mouth daily with breakfast. 90 capsule 2   No current facility-administered medications on file prior to visit.     PAST MEDICAL HISTORY: Past Medical History:  Diagnosis Date   Allergy    Anxiety    Arthritis    back & knee   Asthma     mild per pt shows up with resp illness   Chronic diastolic congestive heart failure (HCC)    Colon polyps    Constipation    Diabetes mellitus without complication (HCC)    Dyspnea    Gastric polyps    Gastroparesis    GERD (gastroesophageal reflux disease)    Headache    sinus headaches and migraines at times    Heart murmur    History of migraine headaches    HTN (hypertension)    Hyperlipidemia    IBS (irritable bowel syndrome)    Joint pain    Lumbar disc disease    S/P aortic valve replacement with bioprosthetic valve 08/23/2016   25 mm Edwards Intuity rapid-deployment bovine pericardial tissue valve via partial upper mini sternotomy   Sleep apnea    TIA (transient ischemic attack)     PAST SURGICAL HISTORY: Past Surgical History:  Procedure Laterality Date   ABDOMINAL HYSTERECTOMY     AORTIC VALVE REPLACEMENT N/A 08/23/2016   Procedure: AORTIC VALVE REPLACEMENT (AVR) - using partial Upper Sternotomy- 52mm Edwards Intuity Aortic Valve used;  Surgeon: Rexene Alberts, MD;  Location: [facility];  Service: Open Heart Surgery;  Laterality: N/A;   BLADDER SUSPENSION  2007   BREAST ENHANCEMENT SURGERY  1980   CARPAL TUNNEL RELEASE Bilateral    COLONOSCOPY     DORSAL COMPARTMENT RELEASE Right 09/15/2014   Procedure: RIGHT WRIST DEQUERVAINS RELEASE ;  Surgeon: Kathryne Hitch, MD;  Location: [facility];  Service: Orthopedics;  Laterality: Right;   ESOPHAGOGASTRODUODENOSCOPY     KNEE ARTHROSCOPY  04/15/2012   Procedure: ARTHROSCOPY KNEE;  Surgeon: [name], MD;  Location: [facility];  Service: Orthopedics;  Laterality: Right;   LAPAROSCOPIC CHOLECYSTECTOMY     LASIK     OOPHORECTOMY     PALATE TO GINGIVA GRAFT  2017   RIGHT/LEFT HEART CATH AND CORONARY ANGIOGRAPHY N/A 08/02/2016   Procedure: Right/Left Heart Cath and Coronary Angiography;  Surgeon: Sherren Mocha, MD;  Location: [facility];  Service: Cardiovascular;  Laterality: N/A;   ROTATOR CUFF REPAIR Left    TEE WITHOUT CARDIOVERSION N/A 08/23/2016   Procedure: TRANSESOPHAGEAL ECHOCARDIOGRAM (TEE);  Surgeon: Rexene Alberts, MD;  Location: [facility];  Service: Open Heart Surgery;  Laterality: N/A;   TOTAL KNEE ARTHROPLASTY  04/15/2012   Procedure: TOTAL KNEE ARTHROPLASTY;  Surgeon: [name], MD;   Location: [facility];  Service: Orthopedics;  Laterality: Right;   TRIGGER FINGER RELEASE Right 04/20/2015   Procedure: RIGHT TRIGGER FINGER RELEASE (TENDON SHEALTH INCISION) ;  Surgeon: [name], MD;  Location: [facility];  Service: Orthopedics;  Laterality: Right;    SOCIAL HISTORY: Social History   Tobacco Use   Smoking status: Never Smoker   Smokeless  tobacco: Never Used  Substance Use Topics   Alcohol use: No   Drug use: No    FAMILY HISTORY: Family History  Problem Relation Age of Onset   COPD Mother    Colon polyps Mother    Irritable bowel syndrome Mother    Anxiety disorder Mother    Heart disease Father    Alcohol abuse Father    Colon polyps Maternal Aunt    Other Neg Hx        hyponatremia    ROS: Review of Systems  Constitutional: Positive for weight loss.  Cardiovascular: Negative for chest pain and claudication.  Gastrointestinal: Negative for diarrhea, nausea and vomiting.  Musculoskeletal:       Negative for muscle weakness  Endo/Heme/Allergies:       Negative for hypoglycemia     PHYSICAL EXAM: Blood pressure (!) 133/49, pulse (!) 56, temperature 98.5 F (36.9 C), temperature source Oral, height 5\' 6"  (1.676 m), weight 198 lb (89.8 kg), SpO2 99 %. Body mass index is 31.96 kg/m. Physical Exam Vitals signs reviewed.  Constitutional:      Appearance: Normal appearance. She is obese.  HENT:     Head: Normocephalic.     Nose: Nose normal.  Neck:     Musculoskeletal: Normal range of motion.  Cardiovascular:     Rate and Rhythm: Normal rate.  Pulmonary:     Effort: Pulmonary effort is normal.  Musculoskeletal: Normal range of motion.  Skin:    General: Skin is warm and dry.  Neurological:     Mental Status: She is alert and oriented to person, place, and time.  Psychiatric:        Mood and Affect: Mood normal.        Behavior: Behavior normal.     RECENT LABS AND TESTS: BMET    Component Value Date/Time     NA 130 (L) 01/20/2019 1543   NA 129 (L) 01/19/2018 0959   K 4.6 01/20/2019 1543   CL 97 01/20/2019 1543   CO2 25 01/20/2019 1543   GLUCOSE 86 01/20/2019 1543   BUN 20 01/20/2019 1543   BUN 16 01/19/2018 0959   CREATININE 0.69 01/20/2019 1543   CALCIUM 9.6 01/20/2019 1543   GFRNONAA 79 01/19/2018 0959   GFRAA 91 01/19/2018 0959   Lab Results  Component Value Date   HGBA1C 5.7 (A) 01/05/2019   HGBA1C 6.1 06/04/2018   HGBA1C 6.6 (H) 01/19/2018   HGBA1C 6.2 (A) 11/25/2017   HGBA1C 6.4 05/23/2017   Lab Results  Component Value Date   INSULIN 12.1 01/19/2018   CBC    Component Value Date/Time   WBC 9.2 01/05/2019 1028   RBC 4.44 01/05/2019 1028   HGB 11.6 (L) 01/05/2019 1028   HGB 11.7 01/19/2018 0959   HCT 35.7 (L) 01/05/2019 1028   HCT 35.7 01/19/2018 0959   PLT 292.0 01/05/2019 1028   PLT 358 04/24/2017 1231   MCV 80.5 01/05/2019 1028   MCV 81 01/19/2018 0959   MCH 26.5 (L) 01/19/2018 0959   MCH 27.4 08/26/2016 0248   MCHC 32.4 01/05/2019 1028   RDW 15.3 01/05/2019 1028   RDW 14.9 01/19/2018 0959   LYMPHSABS 2.9 01/19/2018 0959   MONOABS 0.6 05/11/2015 0836   EOSABS 0.1 01/19/2018 0959   BASOSABS 0.0 01/19/2018 0959   Iron/TIBC/Ferritin/ %Sat No results found for: IRON, TIBC, FERRITIN, IRONPCTSAT Lipid Panel     Component Value Date/Time   CHOL 116 06/04/2018 1012   CHOL 123  01/19/2018 0959   TRIG 70.0 06/04/2018 1012   HDL 47.40 06/04/2018 1012   HDL 54 01/19/2018 0959   CHOLHDL 2 06/04/2018 1012   VLDL 14.0 06/04/2018 1012   LDLCALC 54 06/04/2018 1012   LDLCALC 54 01/19/2018 0959   LDLDIRECT 58.0 11/25/2017 1156   Hepatic Function Panel     Component Value Date/Time   PROT 6.3 01/20/2019 1543   PROT 6.6 01/19/2018 0959   ALBUMIN 4.4 01/05/2019 1028   ALBUMIN 4.6 01/19/2018 0959   AST 22 01/05/2019 1028   ALT 14 01/05/2019 1028   ALKPHOS 128 (H) 01/05/2019 1028   BILITOT 0.4 01/05/2019 1028   BILITOT 0.5 01/19/2018 0959   BILIDIR 0.1  05/11/2015 0836      Component Value Date/Time   TSH 3.38 06/04/2018 1012   TSH 5.300 (H) 01/19/2018 0959   TSH 3.390 04/24/2017 1231     Ref. Range 01/05/2019 10:28  VITD Latest Ref Range: 30.00 - 100.00 ng/mL 56.90     OBESITY BEHAVIORAL INTERVENTION VISIT  Today's visit was # 24  Starting weight: 220 lbs Starting date: 01/19/18 Today's weight : Weight: 198 lb (89.8 kg)  Today's date: 02/11/2019 Total lbs lost to date: 22 lbs At least 15 minutes were spent on discussing the following behavioral intervention visit.   ASK: We discussed the diagnosis of obesity with Mertice P Suttles today and Emerita agreed to give [name] permission to discuss obesity behavioral modification therapy today.  ASSESS: Yolette has the diagnosis of obesity and her BMI today is 31.97 Bell is in the action stage of change   ADVISE: Djeneba was educated on the multiple health risks of obesity as well as the benefit of weight loss to improve her health. She was advised of the need for long term treatment and the importance of lifestyle modifications to improve her current health and to decrease her risk of future health problems.  AGREE: Multiple dietary modification options and treatment options were discussed and  Enzley agreed to follow the recommendations documented in the above note.  ARRANGE: Ryli was educated on the importance of frequent visits to treat obesity as outlined per CMS and USPSTF guidelines and agreed to schedule her next follow up appointment today.  Leary Roca, am acting as transcriptionist for [name], PA-C  I, [name], PA-C have reviewed above note and agree with its content

## 2019-02-12 LAB — COMPREHENSIVE METABOLIC PANEL
ALT: 16 IU/L (ref 0–32)
AST: 26 IU/L (ref 0–40)
Albumin/Globulin Ratio: 2.2 (ref 1.2–2.2)
Albumin: 4.6 g/dL (ref 3.8–4.8)
Alkaline Phosphatase: 151 IU/L — ABNORMAL HIGH (ref 39–117)
BUN/Creatinine Ratio: 26 (ref 12–28)
BUN: 18 mg/dL (ref 8–27)
Bilirubin Total: 0.3 mg/dL (ref 0.0–1.2)
CO2: 23 mmol/L (ref 20–29)
Calcium: 9.3 mg/dL (ref 8.7–10.3)
Chloride: 99 mmol/L (ref 96–106)
Creatinine, Ser: 0.69 mg/dL (ref 0.57–1.00)
GFR calc Af Amer: 103 mL/min/{1.73_m2} (ref >59)
GFR calc non Af Amer: 90 mL/min/{1.73_m2} (ref >59)
Globulin, Total: 2.1 g/dL (ref 1.5–4.5)
Glucose: 124 mg/dL — ABNORMAL HIGH (ref 65–99)
Potassium: 5.4 mmol/L — ABNORMAL HIGH (ref 3.5–5.2)
Sodium: 137 mmol/L (ref 134–144)
Total Protein: 6.7 g/dL (ref 6.0–8.5)

## 2019-02-12 LAB — LIPID PANEL WITH LDL/HDL RATIO
Cholesterol, Total: 122 mg/dL (ref 100–199)
HDL: 55 mg/dL (ref >39)
LDL Calculated: 55 mg/dL (ref 0–99)
LDl/HDL Ratio: 1 ratio (ref 0.0–3.2)
Triglycerides: 58 mg/dL (ref 0–149)
VLDL Cholesterol Cal: 12 mg/dL (ref 5–40)

## 2019-02-13 NOTE — Progress Notes (Signed)
Reviewed and agree with assessment/plan.   Sharlynn Seckinger, MD [facility] 06/19/2016, 12:24 PM Pager:  [phone]  

## 2019-02-16 ENCOUNTER — Encounter: Payer: Self-pay | Admitting: Family Medicine

## 2019-02-17 ENCOUNTER — Other Ambulatory Visit: Payer: Self-pay

## 2019-02-17 ENCOUNTER — Encounter: Payer: Self-pay | Admitting: Family Medicine

## 2019-02-19 ENCOUNTER — Encounter (INDEPENDENT_AMBULATORY_CARE_PROVIDER_SITE_OTHER): Payer: Self-pay | Admitting: Physician Assistant

## 2019-02-22 ENCOUNTER — Other Ambulatory Visit: Payer: Self-pay

## 2019-02-22 ENCOUNTER — Ambulatory Visit (INDEPENDENT_AMBULATORY_CARE_PROVIDER_SITE_OTHER): Payer: Medicare Other | Admitting: Endocrinology

## 2019-02-22 ENCOUNTER — Encounter: Payer: Self-pay | Admitting: Endocrinology

## 2019-02-22 VITALS — BP 142/80 | HR 55 | Ht 66.0 in | Wt 206.4 lb

## 2019-02-22 DIAGNOSIS — E871 Hypo-osmolality and hyponatremia: Secondary | ICD-10-CM

## 2019-02-22 NOTE — Telephone Encounter (Signed)
Please advise 

## 2019-02-22 NOTE — Progress Notes (Signed)
Subjective:    Patient ID: [name], female    DOB: 03/01/1951, 68 y.o.   MRN: [id number]  HPI Pt returns for f/u of hyponatremia (dx'ed 2013; only cause found was reglan, which she cannot safely stop; she takes demeclocycline).  pt states she feels well in general.  She takes meds as rx'ed Past Medical History:  Diagnosis Date  . Allergy   . Anxiety   . Arthritis    back & knee  . Asthma     mild per pt shows up with resp illness  . Chronic diastolic congestive heart failure ([place])   . Colon polyps   . Constipation   . Diabetes mellitus without complication ([place])   . Dyspnea   . Gastric polyps   . Gastroparesis   . GERD (gastroesophageal reflux disease)   . Headache    sinus headaches and migraines at times  . Heart murmur   . History of migraine headaches   . HTN (hypertension)   . Hyperlipidemia   . IBS (irritable bowel syndrome)   . Joint pain   . Lumbar disc disease   . S/P aortic valve replacement with bioprosthetic valve 08/23/2016   25 mm Edwards Intuity rapid-deployment bovine pericardial tissue valve via partial upper mini sternotomy  . Sleep apnea   . TIA (transient ischemic attack)     Past Surgical History:  Procedure Laterality Date  . ABDOMINAL HYSTERECTOMY    . AORTIC VALVE REPLACEMENT N/A 08/23/2016   Procedure: AORTIC VALVE REPLACEMENT (AVR) - using partial Upper Sternotomy- 13mm Edwards Intuity Aortic Valve used;  Surgeon: Rexene Alberts, MD;  Location: [facility];  Service: Open Heart Surgery;  Laterality: N/A;  . BLADDER SUSPENSION  2007  . BREAST ENHANCEMENT SURGERY  1980  . CARPAL TUNNEL RELEASE Bilateral   . COLONOSCOPY    . DORSAL COMPARTMENT RELEASE Right 09/15/2014   Procedure: RIGHT WRIST DEQUERVAINS RELEASE ;  Surgeon: Kathryne Hitch, MD;  Location: [facility];  Service: Orthopedics;  Laterality: Right;  . ESOPHAGOGASTRODUODENOSCOPY    . KNEE ARTHROSCOPY  04/15/2012   Procedure: ARTHROSCOPY KNEE;  Surgeon: [name],  MD;  Location: [facility];  Service: Orthopedics;  Laterality: Right;  . LAPAROSCOPIC CHOLECYSTECTOMY    . LASIK    . OOPHORECTOMY    . PALATE TO GINGIVA GRAFT  2017  . RIGHT/LEFT HEART CATH AND CORONARY ANGIOGRAPHY N/A 08/02/2016   Procedure: Right/Left Heart Cath and Coronary Angiography;  Surgeon: Sherren Mocha, MD;  Location: [facility];  Service: Cardiovascular;  Laterality: N/A;  . ROTATOR CUFF REPAIR Left   . TEE WITHOUT CARDIOVERSION N/A 08/23/2016   Procedure: TRANSESOPHAGEAL ECHOCARDIOGRAM (TEE);  Surgeon: Rexene Alberts, MD;  Location: [facility];  Service: Open Heart Surgery;  Laterality: N/A;  . TOTAL KNEE ARTHROPLASTY  04/15/2012   Procedure: TOTAL KNEE ARTHROPLASTY;  Surgeon: [name], MD;  Location: [facility];  Service: Orthopedics;  Laterality: Right;  . TRIGGER FINGER RELEASE Right 04/20/2015   Procedure: RIGHT TRIGGER FINGER RELEASE (TENDON SHEALTH INCISION) ;  Surgeon: [name], MD;  Location: [facility];  Service: Orthopedics;  Laterality: Right;    Social History   Socioeconomic History  . Marital status: Married    Spouse name: [name]  . Number of children: 1  . Years of education: Not on file  . Highest education level: Not on file  Occupational History  . Occupation: Retired, Curator:  RETIRED  Social Needs  . Financial resource strain: Not on file  . Food insecurity    Worry: Not on file    Inability: Not on file  . Transportation needs    Medical: Not on file    Non-medical: Not on file  Tobacco Use  . Smoking status: Never Smoker  . Smokeless tobacco: Never Used  Substance and Sexual Activity  . Alcohol use: No  . Drug use: No  . Sexual activity: Yes  Lifestyle  . Physical activity    Days per week: Not on file    Minutes per session: Not on file  . Stress: Not on file  Relationships  . Social Herbalist on phone: Not on file    Gets together: Not on file    Attends religious service:  Not on file    Active member of club or organization: Not on file    Attends meetings of clubs or organizations: Not on file    Relationship status: Not on file  . Intimate partner violence    Fear of current or ex partner: Not on file    Emotionally abused: Not on file    Physically abused: Not on file    Forced sexual activity: Not on file  Other Topics Concern  . Not on file  Social History Narrative   She lives with husband (1978) and two dogs. Step-son [name] (1971-psychiatrist in [place]). No grandkids. 2 dogs-sheltie/collie mix and border collie mix      Highest level of education:  Master in education   She is retired Animal nutritionist x 32 years.      Hobbies: time with dogs             Current Outpatient Medications on File Prior to Visit  Medication Sig Dispense Refill  . aspirin EC 81 MG tablet Take 1 tablet (81 mg total) by mouth daily. 90 tablet 3  . atorvastatin (LIPITOR) 40 MG tablet TAKE 1 TABLET (40 MG TOTAL) BY MOUTH DAILY AT 6 PM. 90 tablet 3  . Biotin 5000 MCG TABS Take 5,000 mcg by mouth daily.     . calcium-vitamin D (OSCAL WITH D) 500-200 MG-UNIT per tablet Take 2 tablets by mouth daily.     . Cholecalciferol (VITAMIN D) 125 MCG (5000 UT) CAPS Take 5,000 Units by mouth daily.    . clindamycin (CLEOCIN) 300 MG capsule TAKE 2 CAPSULES BY MOUTH 1 HOUR PRIOR TOO DENTAL CLEANING. 2 capsule 0  . clopidogrel (PLAVIX) 75 MG tablet TAKE 1 TABLET BY MOUTH EVERY DAY 90 tablet 3  . demeclocycline (DECLOMYCIN) 300 MG tablet Take 1 tablet (300 mg total) by mouth daily. 30 tablet 11  . fluticasone (FLONASE) 50 MCG/ACT nasal spray Place 2 sprays into both nostrils daily.     [name] ipratropium (ATROVENT) 0.03 % nasal spray Place 2 sprays into both nostrils every 12 (twelve) hours.    . Lancets (ONETOUCH ULTRASOFT) lancets Use to test blood sugars daily. Dx: E11.9 100 each 12  . loratadine (CLARITIN) 10 MG tablet Take 10 mg by mouth at bedtime.     . Melatonin 5 MG TABS  Take by mouth.    . metFORMIN (GLUCOPHAGE) 500 MG tablet Take one tablet with breakfast and 1/2-1 with dinner (Patient taking differently: Take one tablet with breakfast and 1 tablet with dinner) 180 tablet 1  . metoCLOPramide (REGLAN) 10 MG tablet TAKE 1/2 TABLET BY MOUTH 4 TIMES A DAY 60 tablet  5  . metoprolol tartrate (LOPRESSOR) 25 MG tablet Take 0.5 tablets (12.5 mg total) by mouth 2 (two) times daily. 45 tablet 3  . Multiple Vitamin (MULTIVITAMIN WITH MINERALS) TABS tablet Take 1 tablet by mouth daily.    . naproxen sodium (ALEVE) 220 MG tablet Take 220 mg by mouth.    [name] omeprazole (PRILOSEC OTC) 20 MG tablet Take 40 mg by mouth daily with breakfast.    . ONETOUCH VERIO test strip USE TO TEST BLOOD SUGARS DAILY. 100 strip 4  . Probiotic Product (ALIGN) 4 MG CAPS Take 4 mg by mouth daily.     . psyllium (METAMUCIL SMOOTH TEXTURE) 28 % packet Take 1 packet by mouth daily before breakfast.     . venlafaxine XR (EFFEXOR-XR) 75 MG 24 hr capsule Take 1 capsule (75 mg total) by mouth daily with breakfast. 90 capsule 2   No current facility-administered medications on file prior to visit.     Allergies  Allergen Reactions  . Augmentin [Amoxicillin-Pot Clavulanate] Rash    rash  . Erythromycin Nausea Only  . Penicillins Itching and Rash    Has patient had a PCN reaction causing immediate rash, facial/tongue/throat swelling, SOB or lightheadedness with hypotension: No Has patient had a PCN reaction causing severe rash involving mucus membranes or skin necrosis: No Has patient had a PCN reaction that required hospitalization: No Has patient had a PCN reaction occurring within the last 10 years: No  If all of the above answers are "NO", then may proceed with Cephalosporin use.     Family History  Problem Relation Age of Onset  . COPD Mother   . Colon polyps Mother   . Irritable bowel syndrome Mother   . Anxiety disorder Mother   . Heart disease Father   . Alcohol abuse Father   . Colon  polyps Maternal Aunt   . Other Neg Hx        hyponatremia    BP (!) 142/80 (BP Location: Left Arm, Patient Position: Sitting, Cuff Size: Large)   Pulse (!) 55   Ht 5\' 6"  (1.676 m)   Wt 206 lb 6.4 oz (93.6 kg)   LMP  (LMP Unknown)   SpO2 98%   BMI 33.31 kg/m   Review of Systems Denies sob.     Objective:   Physical Exam VITAL SIGNS:  See vs page GENERAL: no distress Ext: trace bilat leg edema.     Lab Results  Component Value Date   CREATININE 0.69 02/11/2019   BUN 18 02/11/2019   NA 137 02/11/2019   K 5.4 (H) 02/11/2019   CL 99 02/11/2019   CO2 23 02/11/2019       Assessment & Plan:  Hyponatremia: well-controlled.   HTN: is noted today.     Patient Instructions  Please try to reduce your fluid intake to 2 quarts per day.  The easiest way to do Amelia Jo is to not drink during meals.   Please continue the same demeclocycline. Your blood pressure is high today.  Please see another provider soon, to have it rechecked.  Please come back for a follow-up appointment in 3 months.

## 2019-02-22 NOTE — Patient Instructions (Addendum)
Please try to reduce your fluid intake to 2 quarts per day.  The easiest way to do [name] is to not drink during meals.   Please continue the same demeclocycline. Your blood pressure is high today.  Please see another provider soon, to have it rechecked.  Please come back for a follow-up appointment in 3 months.

## 2019-02-25 ENCOUNTER — Ambulatory Visit (INDEPENDENT_AMBULATORY_CARE_PROVIDER_SITE_OTHER): Payer: Medicare Other | Admitting: Physician Assistant

## 2019-02-25 ENCOUNTER — Encounter (INDEPENDENT_AMBULATORY_CARE_PROVIDER_SITE_OTHER): Payer: Self-pay | Admitting: Physician Assistant

## 2019-02-25 ENCOUNTER — Other Ambulatory Visit: Payer: Self-pay

## 2019-02-25 VITALS — BP 137/50 | HR 59 | Temp 98.3°F | Ht 66.0 in | Wt 201.0 lb

## 2019-02-25 DIAGNOSIS — E669 Obesity, unspecified: Secondary | ICD-10-CM | POA: Diagnosis not present

## 2019-02-25 DIAGNOSIS — E119 Type 2 diabetes mellitus without complications: Secondary | ICD-10-CM

## 2019-02-25 DIAGNOSIS — Z6832 Body mass index (BMI) 32.0-32.9, adult: Secondary | ICD-10-CM

## 2019-02-25 NOTE — Telephone Encounter (Signed)
Pt is scheduled for 9/17 at 3:20 pm to see Dr. Meda Coffee and her echo will be as scheduled for 06/14/19, due to insurance.  Pt made aware of appt date and time, and agrees with this plan.

## 2019-03-02 NOTE — Progress Notes (Signed)
Office: [phone]  /  Fax: [phone]   HPI:   Chief Complaint: OBESITY [name] is here to discuss her progress with her obesity treatment plan. She is on the Category 3 plan and is following her eating plan approximately 95 % of the time. She states she is exercising 0 minutes 0 times per week. November reports that she is ordering seven or eight ounces of meat when she is eating out at lunch and eating her lunch meal for dinner. Her weight is 201 lb (91.2 kg) today and has had a weight gain of 3 pounds over a period of 2 weeks since her last visit. She has lost 19 lbs since starting treatment with [name].  Diabetes II [name] has a diagnosis of diabetes type II. [name] states fasting BGs range between 115 and 134 and she denies any hypoglycemic episodes. [name] denies any nausea, vomiting, diarrhea or polyphagia on metformin. She has been working on intensive lifestyle modifications including diet, exercise, and weight loss to help control her blood glucose levels.  ASSESSMENT AND PLAN:  Type 2 diabetes mellitus without complication, without long-term current use of insulin (HCC)  Class 1 obesity with serious comorbidity and body mass index (BMI) of 32.0 to 32.9 in adult, unspecified obesity type  PLAN:  Diabetes II [name] has been given extensive diabetes education by myself today including ideal fasting and post-prandial blood glucose readings, individual ideal Hgb A1c goals and hypoglycemia prevention. We discussed the importance of good blood sugar control to decrease the likelihood of diabetic complications such as nephropathy, neuropathy, limb loss, blindness, coronary artery disease, and death. We discussed the importance of intensive lifestyle modification including diet, exercise and weight loss as the first line treatment for diabetes. Berit agrees to continue with medications and weight loss and she will follow up at the agreed upon  time.  Obesity [name] is currently in the action stage of change. As such, her goal is to continue with weight loss efforts She has agreed to follow the Category 3 plan [name] has been instructed to work up to a goal of 150 minutes of combined cardio and strengthening exercise per week for weight loss and overall health benefits. We discussed the following Behavioral Modification Strategies today: increasing lean protein intake and work on meal planning and easy cooking plans  [name] has agreed to follow up with our clinic in 2 weeks. She was informed of the importance of frequent follow up visits to maximize her success with intensive lifestyle modifications for her multiple health conditions.  ALLERGIES: Allergies  Allergen Reactions   Augmentin [Amoxicillin-Pot Clavulanate] Rash    rash   Erythromycin Nausea Only   Penicillins Itching and Rash    Has patient had a PCN reaction causing immediate rash, facial/tongue/throat swelling, SOB or lightheadedness with hypotension: No Has patient had a PCN reaction causing severe rash involving mucus membranes or skin necrosis: No Has patient had a PCN reaction that required hospitalization: No Has patient had a PCN reaction occurring within the last 10 years: No  If all of the above answers are "NO", then may proceed with Cephalosporin use.     MEDICATIONS: Current Outpatient Medications on File Prior to Visit  Medication Sig Dispense Refill   aspirin EC 81 MG tablet Take 1 tablet (81 mg total) by mouth daily. 90 tablet 3   atorvastatin (LIPITOR) 40 MG tablet TAKE 1 TABLET (40 MG TOTAL) BY MOUTH DAILY AT 6 PM. 90 tablet 3   Biotin 5000 MCG TABS  Take 5,000 mcg by mouth daily.      calcium-vitamin D (OSCAL WITH D) 500-200 MG-UNIT per tablet Take 2 tablets by mouth daily.      Cholecalciferol (VITAMIN D) 125 MCG (5000 UT) CAPS Take 5,000 Units by mouth daily.     clindamycin (CLEOCIN) 300 MG capsule TAKE 2 CAPSULES BY  MOUTH 1 HOUR PRIOR TOO DENTAL CLEANING. 2 capsule 0   clopidogrel (PLAVIX) 75 MG tablet TAKE 1 TABLET BY MOUTH EVERY DAY 90 tablet 3   demeclocycline (DECLOMYCIN) 300 MG tablet Take 1 tablet (300 mg total) by mouth daily. 30 tablet 11   fluticasone (FLONASE) 50 MCG/ACT nasal spray Place 2 sprays into both nostrils daily.      ipratropium (ATROVENT) 0.03 % nasal spray Place 2 sprays into both nostrils every 12 (twelve) hours.     Lancets (ONETOUCH ULTRASOFT) lancets Use to test blood sugars daily. Dx: E11.9 100 each 12   loratadine (CLARITIN) 10 MG tablet Take 10 mg by mouth at bedtime.      Melatonin 5 MG TABS Take by mouth.     metFORMIN (GLUCOPHAGE) 500 MG tablet Take one tablet with breakfast and 1/2-1 with dinner (Patient taking differently: Take one tablet with breakfast and 1 tablet with dinner) 180 tablet 1   metoCLOPramide (REGLAN) 10 MG tablet TAKE 1/2 TABLET BY MOUTH 4 TIMES A DAY 60 tablet 5   metoprolol tartrate (LOPRESSOR) 25 MG tablet Take 0.5 tablets (12.5 mg total) by mouth 2 (two) times daily. 45 tablet 3   Multiple Vitamin (MULTIVITAMIN WITH MINERALS) TABS tablet Take 1 tablet by mouth daily.     naproxen sodium (ALEVE) 220 MG tablet Take 220 mg by mouth.     omeprazole (PRILOSEC OTC) 20 MG tablet Take 40 mg by mouth daily with breakfast.     ONETOUCH VERIO test strip USE TO TEST BLOOD SUGARS DAILY. 100 strip 4   Probiotic Product (ALIGN) 4 MG CAPS Take 4 mg by mouth daily.      psyllium (METAMUCIL SMOOTH TEXTURE) 28 % packet Take 1 packet by mouth daily before breakfast.      venlafaxine XR (EFFEXOR-XR) 75 MG 24 hr capsule Take 1 capsule (75 mg total) by mouth daily with breakfast. 90 capsule 2   No current facility-administered medications on file prior to visit.     PAST MEDICAL HISTORY: Past Medical History:  Diagnosis Date   Allergy    Anxiety    Arthritis    back & knee   Asthma     mild per pt shows up with resp illness   Chronic diastolic  congestive heart failure (HCC)    Colon polyps    Constipation    Diabetes mellitus without complication (HCC)    Dyspnea    Gastric polyps    Gastroparesis    GERD (gastroesophageal reflux disease)    Headache    sinus headaches and migraines at times   Heart murmur    History of migraine headaches    HTN (hypertension)    Hyperlipidemia    IBS (irritable bowel syndrome)    Joint pain    Lumbar disc disease    S/P aortic valve replacement with bioprosthetic valve 08/23/2016   25 mm Edwards Intuity rapid-deployment bovine pericardial tissue valve via partial upper mini sternotomy   Sleep apnea    TIA (transient ischemic attack)     PAST SURGICAL HISTORY: Past Surgical History:  Procedure Laterality Date   ABDOMINAL HYSTERECTOMY  AORTIC VALVE REPLACEMENT N/A 08/23/2016   Procedure: AORTIC VALVE REPLACEMENT (AVR) - using partial Upper Sternotomy- 58mm Edwards Intuity Aortic Valve used;  Surgeon: Rexene Alberts, MD;  Location: [place];  Service: Open Heart Surgery;  Laterality: N/A;   BLADDER SUSPENSION  2007   BREAST ENHANCEMENT SURGERY  1980   CARPAL TUNNEL RELEASE Bilateral    COLONOSCOPY     DORSAL COMPARTMENT RELEASE Right 09/15/2014   Procedure: RIGHT WRIST DEQUERVAINS RELEASE ;  Surgeon: Kathryne Hitch, MD;  Location: [place];  Service: Orthopedics;  Laterality: Right;   ESOPHAGOGASTRODUODENOSCOPY     KNEE ARTHROSCOPY  04/15/2012   Procedure: ARTHROSCOPY KNEE;  Surgeon: [name], MD;  Location: [place];  Service: Orthopedics;  Laterality: Right;   LAPAROSCOPIC CHOLECYSTECTOMY     LASIK     OOPHORECTOMY     PALATE TO GINGIVA GRAFT  2017   RIGHT/LEFT HEART CATH AND CORONARY ANGIOGRAPHY N/A 08/02/2016   Procedure: Right/Left Heart Cath and Coronary Angiography;  Surgeon: Sherren Mocha, MD;  Location: [facility];  Service: Cardiovascular;  Laterality: N/A;   ROTATOR CUFF REPAIR Left    TEE WITHOUT CARDIOVERSION  N/A 08/23/2016   Procedure: TRANSESOPHAGEAL ECHOCARDIOGRAM (TEE);  Surgeon: Rexene Alberts, MD;  Location: [place];  Service: Open Heart Surgery;  Laterality: N/A;   TOTAL KNEE ARTHROPLASTY  04/15/2012   Procedure: TOTAL KNEE ARTHROPLASTY;  Surgeon: [name], MD;  Location: [place];  Service: Orthopedics;  Laterality: Right;   TRIGGER FINGER RELEASE Right 04/20/2015   Procedure: RIGHT TRIGGER FINGER RELEASE (TENDON SHEALTH INCISION) ;  Surgeon: [name], MD;  Location: [place];  Service: Orthopedics;  Laterality: Right;    SOCIAL HISTORY: Social History   Tobacco Use   Smoking status: Never Smoker   Smokeless tobacco: Never Used  Substance Use Topics   Alcohol use: No   Drug use: No    FAMILY HISTORY: Family History  Problem Relation Age of Onset   COPD Mother    Colon polyps Mother    Irritable bowel syndrome Mother    Anxiety disorder Mother    Heart disease Father    Alcohol abuse Father    Colon polyps Maternal Aunt    Other Neg Hx        hyponatremia    ROS: Review of Systems  Constitutional: Negative for weight loss.  Gastrointestinal: Negative for diarrhea, nausea and vomiting.  Endo/Heme/Allergies:       Negative for polyphagia Negative for hypoglycemia    PHYSICAL EXAM: Blood pressure (!) 137/50, pulse (!) 59, temperature 98.3 F (36.8 C), temperature source Oral, height 5\' 6"  (1.676 m), weight 201 lb (91.2 kg), SpO2 97 %. Body mass index is 32.44 kg/m. Physical Exam Vitals signs reviewed.  Constitutional:      Appearance: Normal appearance. She is well-developed. She is obese.  Cardiovascular:     Rate and Rhythm: Normal rate.  Pulmonary:     Effort: Pulmonary effort is normal.  Musculoskeletal: Normal range of motion.  Skin:    General: Skin is warm and dry.  Neurological:     Mental Status: She is alert and oriented to person, place, and time.  Psychiatric:        Mood and Affect: Mood normal.         Behavior: Behavior normal.     RECENT LABS AND TESTS: BMET    Component Value Date/Time   NA 137 02/11/2019 1103  K 5.4 (H) 02/11/2019 1103   CL 99 02/11/2019 1103   CO2 23 02/11/2019 1103   GLUCOSE 124 (H) 02/11/2019 1103   GLUCOSE 86 01/20/2019 1543   BUN 18 02/11/2019 1103   CREATININE 0.69 02/11/2019 1103   CALCIUM 9.3 02/11/2019 1103   GFRNONAA 90 02/11/2019 1103   GFRAA 103 02/11/2019 1103   Lab Results  Component Value Date   HGBA1C 5.7 (A) 01/05/2019   HGBA1C 6.1 06/04/2018   HGBA1C 6.6 (H) 01/19/2018   HGBA1C 6.2 (A) 11/25/2017   HGBA1C 6.4 05/23/2017   Lab Results  Component Value Date   INSULIN 12.1 01/19/2018   CBC    Component Value Date/Time   WBC 9.2 01/05/2019 1028   RBC 4.44 01/05/2019 1028   HGB 11.6 (L) 01/05/2019 1028   HGB 11.7 01/19/2018 0959   HCT 35.7 (L) 01/05/2019 1028   HCT 35.7 01/19/2018 0959   PLT 292.0 01/05/2019 1028   PLT 358 04/24/2017 1231   MCV 80.5 01/05/2019 1028   MCV 81 01/19/2018 0959   MCH 26.5 (L) 01/19/2018 0959   MCH 27.4 08/26/2016 0248   MCHC 32.4 01/05/2019 1028   RDW 15.3 01/05/2019 1028   RDW 14.9 01/19/2018 0959   LYMPHSABS 2.9 01/19/2018 0959   MONOABS 0.6 05/11/2015 0836   EOSABS 0.1 01/19/2018 0959   BASOSABS 0.0 01/19/2018 0959   Iron/TIBC/Ferritin/ %Sat No results found for: IRON, TIBC, FERRITIN, IRONPCTSAT Lipid Panel     Component Value Date/Time   CHOL 122 02/11/2019 1103   TRIG 58 02/11/2019 1103   HDL 55 02/11/2019 1103   CHOLHDL 2 06/04/2018 1012   VLDL 14.0 06/04/2018 1012   LDLCALC 55 02/11/2019 1103   LDLDIRECT 58.0 11/25/2017 1156   Hepatic Function Panel     Component Value Date/Time   PROT 6.7 02/11/2019 1103   ALBUMIN 4.6 02/11/2019 1103   AST 26 02/11/2019 1103   ALT 16 02/11/2019 1103   ALKPHOS 151 (H) 02/11/2019 1103   BILITOT 0.3 02/11/2019 1103   BILIDIR 0.1 05/11/2015 0836      Component Value Date/Time   TSH 3.38 06/04/2018 1012   TSH 5.300 (H) 01/19/2018 0959    TSH 3.390 04/24/2017 1231     Ref. Range 01/05/2019 10:28  VITD Latest Ref Range: 30.00 - 100.00 ng/mL 56.90    OBESITY BEHAVIORAL INTERVENTION VISIT  Today's visit was # 25   Starting weight: 220 lbs Starting date: 01/19/2018 Today's weight : 201 lbs Today's date: 02/25/2019 Total lbs lost to date: 19    02/25/2019  Height 5\' 6"  (1.676 m)  Weight 201 lb (91.2 kg)  BMI (Calculated) 32.46  BLOOD PRESSURE - SYSTOLIC 0000000  BLOOD PRESSURE - DIASTOLIC 50   Body Fat % 123456 %  Total Body Water (lbs) 75 lbs    ASK: We discussed the diagnosis of obesity with Sharlon P Claude today and Audrinna agreed to give [name] permission to discuss obesity behavioral modification therapy today.  ASSESS: Sarit has the diagnosis of obesity and her BMI today is 32.46 Lemma is in the action stage of change   ADVISE: Azyria was educated on the multiple health risks of obesity as well as the benefit of weight loss to improve her health. She was advised of the need for long term treatment and the importance of lifestyle modifications to improve her current health and to decrease her risk of future health problems.  AGREE: Multiple dietary modification options and treatment options were discussed and  Nekeisha agreed to follow the recommendations documented in the above note.  ARRANGE: Jamia was educated on the importance of frequent visits to treat obesity as outlined per CMS and USPSTF guidelines and agreed to schedule her next follow up appointment today.  Corey Skains, am acting as transcriptionist for [name], PA-C I, [name], PA-C have reviewed above note and agree with its content

## 2019-03-04 ENCOUNTER — Encounter: Payer: Self-pay | Admitting: Family Medicine

## 2019-03-09 ENCOUNTER — Ambulatory Visit (INDEPENDENT_AMBULATORY_CARE_PROVIDER_SITE_OTHER): Payer: Medicare Other

## 2019-03-09 ENCOUNTER — Other Ambulatory Visit: Payer: Self-pay

## 2019-03-09 VITALS — BP 122/68 | Temp 97.2°F | Ht 66.0 in | Wt 205.0 lb

## 2019-03-09 DIAGNOSIS — Z0000 Encounter for general adult medical examination without abnormal findings: Secondary | ICD-10-CM

## 2019-03-09 NOTE — Progress Notes (Signed)
Subjective:   [name] is a 68 y.o. female who presents for Medicare Annual (Subsequent) preventive examination.  Review of Systems:   Cardiac Risk Factors include: advanced age (>88men, >89 women);diabetes mellitus;dyslipidemia     Objective:     Vitals: BP 122/68 (BP Location: Left Arm, Patient Position: Sitting, Cuff Size: Large)    Temp (!) 97.2 F (36.2 C) (Temporal)    Ht 5\' 6"  (1.676 m)    Wt 205 lb (93 kg)    LMP  (LMP Unknown)    BMI 33.09 kg/m   Body mass index is 33.09 kg/m.  Advanced Directives 03/09/2019 11/05/2017 08/23/2016 08/21/2016 08/02/2016 04/20/2015 04/19/2015  Does Patient Have a Medical Advance Directive? Yes No Yes Yes Yes Yes Yes  Type of Advance Directive Living will - Living will - - Healthcare Power of [place]  Does patient want to make changes to medical advance directive? No - Patient declined - No - Patient declined No - Patient declined - No - Patient declined -  Copy of [place] in Chart? - - - - - No - copy requested -  Would patient like information on creating a medical advance directive? - No - Patient declined - - - - -  Pre-existing out of facility DNR order (yellow form or pink MOST form) - - - - - - -    Tobacco Social History   Tobacco Use  Smoking Status Never Smoker  Smokeless Tobacco Never Used     Counseling given: Not Answered   Clinical Intake:  Pre-visit preparation completed: Yes  Pain : No/denies pain     Diabetes: Yes CBG done?: No Did pt. bring in CBG monitor from home?: No  How often do you need to have someone help you when you read instructions, pamphlets, or other written materials from your doctor or pharmacy?: 1 - Never  Interpreter Needed?: No  Information entered by :: Denman George LPN  Past Medical History:  Diagnosis Date   Allergy    Anxiety    Arthritis    back & knee   Asthma     mild per pt shows up with resp illness    Chronic diastolic congestive heart failure ([place])    Colon polyps    Constipation    Diabetes mellitus without complication (HCC)    Dyspnea    Gastric polyps    Gastroparesis    GERD (gastroesophageal reflux disease)    Headache    sinus headaches and migraines at times   Heart murmur    History of migraine headaches    HTN (hypertension)    Hyperlipidemia    IBS (irritable bowel syndrome)    Joint pain    Lumbar disc disease    S/P aortic valve replacement with bioprosthetic valve 08/23/2016   25 mm Edwards Intuity rapid-deployment bovine pericardial tissue valve via partial upper mini sternotomy   Sleep apnea    TIA (transient ischemic attack)    Past Surgical History:  Procedure Laterality Date   ABDOMINAL HYSTERECTOMY     AORTIC VALVE REPLACEMENT N/A 08/23/2016   Procedure: AORTIC VALVE REPLACEMENT (AVR) - using partial Upper Sternotomy- 19mm Edwards Intuity Aortic Valve used;  Surgeon: Rexene Alberts, MD;  Location: [facility];  Service: Open Heart Surgery;  Laterality: N/A;   BLADDER SUSPENSION  2007   BREAST ENHANCEMENT SURGERY  1980   CARPAL TUNNEL RELEASE Bilateral    COLONOSCOPY  DORSAL COMPARTMENT RELEASE Right 09/15/2014   Procedure: RIGHT WRIST DEQUERVAINS RELEASE ;  Surgeon: Kathryne Hitch, MD;  Location: [facility];  Service: Orthopedics;  Laterality: Right;   ESOPHAGOGASTRODUODENOSCOPY     KNEE ARTHROSCOPY  04/15/2012   Procedure: ARTHROSCOPY KNEE;  Surgeon: [name], MD;  Location: [facility];  Service: Orthopedics;  Laterality: Right;   LAPAROSCOPIC CHOLECYSTECTOMY     LASIK     OOPHORECTOMY     PALATE TO GINGIVA GRAFT  2017   RIGHT/LEFT HEART CATH AND CORONARY ANGIOGRAPHY N/A 08/02/2016   Procedure: Right/Left Heart Cath and Coronary Angiography;  Surgeon: Sherren Mocha, MD;  Location: [facility];  Service: Cardiovascular;  Laterality: N/A;   ROTATOR CUFF REPAIR Left    TEE WITHOUT CARDIOVERSION N/A  08/23/2016   Procedure: TRANSESOPHAGEAL ECHOCARDIOGRAM (TEE);  Surgeon: Rexene Alberts, MD;  Location: [facility];  Service: Open Heart Surgery;  Laterality: N/A;   TOTAL KNEE ARTHROPLASTY  04/15/2012   Procedure: TOTAL KNEE ARTHROPLASTY;  Surgeon: [name], MD;  Location: [facility];  Service: Orthopedics;  Laterality: Right;   TRIGGER FINGER RELEASE Right 04/20/2015   Procedure: RIGHT TRIGGER FINGER RELEASE (TENDON SHEALTH INCISION) ;  Surgeon: [name], MD;  Location: [facility];  Service: Orthopedics;  Laterality: Right;   Family History  Problem Relation Age of Onset   COPD Mother    Colon polyps Mother    Irritable bowel syndrome Mother    Anxiety disorder Mother    Heart disease Father    Alcohol abuse Father    Colon polyps Maternal Aunt    Other Neg Hx        hyponatremia   Social History   Socioeconomic History   Marital status: Married    Spouse name: Engineer, materials   Number of children: 1   Years of education: Not on file   Highest education level: Not on file  Occupational History   Occupation: Retired, Curator: RETIRED  Social Designer, fashion/clothing strain: Not on file   Food insecurity    Worry: Not on file    Inability: Not on Lexicographer needs    Medical: Not on file    Non-medical: Not on file  Tobacco Use   Smoking status: Never Smoker   Smokeless tobacco: Never Used  Substance and Sexual Activity   Alcohol use: No   Drug use: No   Sexual activity: Yes  Lifestyle   Physical activity    Days per week: Not on file    Minutes per session: Not on file   Stress: Not on file  Relationships   Social connections    Talks on phone: Not on file    Gets together: Not on file    Attends religious service: Not on file    Active member of club or organization: Not on file    Attends meetings of clubs or organizations: Not on file    Relationship status: Not on file  Other Topics  Concern   Not on file  Social History Narrative   She lives with husband (1978) and two dogs. Step-son [name] (1971-psychiatrist in [place]). No grandkids. 2 dogs-sheltie/collie mix and border collie mix      Highest level of education:  Master in education   She is retired Animal nutritionist x 32 years.      Hobbies: time with dogs  Outpatient Encounter Medications as of 03/09/2019  Medication Sig   aspirin EC 81 MG tablet Take 1 tablet (81 mg total) by mouth daily.   atorvastatin (LIPITOR) 40 MG tablet TAKE 1 TABLET (40 MG TOTAL) BY MOUTH DAILY AT 6 PM.   Biotin 5000 MCG TABS Take 5,000 mcg by mouth daily.    calcium-vitamin D (OSCAL WITH D) 500-200 MG-UNIT per tablet Take 2 tablets by mouth daily.    Cholecalciferol (VITAMIN D) 125 MCG (5000 UT) CAPS Take 5,000 Units by mouth daily.   clindamycin (CLEOCIN) 300 MG capsule TAKE 2 CAPSULES BY MOUTH 1 HOUR PRIOR TOO DENTAL CLEANING.   clopidogrel (PLAVIX) 75 MG tablet TAKE 1 TABLET BY MOUTH EVERY DAY   demeclocycline (DECLOMYCIN) 300 MG tablet Take 1 tablet (300 mg total) by mouth daily.   fluticasone (FLONASE) 50 MCG/ACT nasal spray Place 2 sprays into both nostrils daily.    ipratropium (ATROVENT) 0.03 % nasal spray Place 2 sprays into both nostrils every 12 (twelve) hours.   Lancets (ONETOUCH ULTRASOFT) lancets Use to test blood sugars daily. Dx: E11.9   loratadine (CLARITIN) 10 MG tablet Take 10 mg by mouth at bedtime.    Melatonin 5 MG TABS Take by mouth.   metFORMIN (GLUCOPHAGE) 500 MG tablet Take one tablet with breakfast and 1/2-1 with dinner (Patient taking differently: Take one tablet with breakfast and 1 tablet with dinner)   metoCLOPramide (REGLAN) 10 MG tablet TAKE 1/2 TABLET BY MOUTH 4 TIMES A DAY   metoprolol tartrate (LOPRESSOR) 25 MG tablet Take 0.5 tablets (12.5 mg total) by mouth 2 (two) times daily.   Multiple Vitamin (MULTIVITAMIN WITH MINERALS) TABS tablet Take 1 tablet by mouth  daily.   naproxen sodium (ALEVE) 220 MG tablet Take 220 mg by mouth.   omeprazole (PRILOSEC OTC) 20 MG tablet Take 40 mg by mouth daily with breakfast.   ONETOUCH VERIO test strip USE TO TEST BLOOD SUGARS DAILY.   Probiotic Product (ALIGN) 4 MG CAPS Take 4 mg by mouth daily.    psyllium (METAMUCIL SMOOTH TEXTURE) 28 % packet Take 1 packet by mouth daily before breakfast.    venlafaxine XR (EFFEXOR-XR) 75 MG 24 hr capsule Take 1 capsule (75 mg total) by mouth daily with breakfast.   No facility-administered encounter medications on file as of 03/09/2019.     Activities of Daily Living In your present state of health, do you have any difficulty performing the following activities: 03/09/2019  Hearing? N  Vision? N  Difficulty concentrating or making decisions? N  Walking or climbing stairs? N  Dressing or bathing? N  Doing errands, shopping? N  Preparing Food and eating ? N  Using the Toilet? N  In the past six months, have you accidently leaked urine? N  Do you have problems with loss of bowel control? N  Managing your Medications? N  Managing your Finances? N  Housekeeping or managing your Housekeeping? N  Some recent data might be hidden    Patient Care Team: [name], MD as PCP - General (Family Medicine) Dorothy Spark, MD as PCP - Cardiology (Cardiology) Olga Millers, MD as Consulting Physician (Obstetrics and Gynecology) Pieter Partridge, DO as Consulting Physician (Neurology) [name], PA-C as Physician Assistant (Specialist) Rutherford Guys, MD as Consulting Physician (Ophthalmology) Renato Shin, MD as Consulting Physician (Endocrinology) Harriett Sine, MD as Consulting Physician (Dermatology) Harold Hedge, Darrick Grinder, MD as Consulting Physician (Allergy and Immunology) Irene Shipper, MD as Consulting Physician (Gastroenterology)  Assessment:   This is a routine wellness examination for Byrd.  Exercise Activities and Dietary  recommendations Current Exercise Habits: Home exercise routine, Type of exercise: calisthenics, Time (Minutes): 30, Frequency (Times/Week): 5, Weekly Exercise (Minutes/Week): 150, Intensity: Mild  Goals     exercise     Hope to get back to the gym when able  [place] gym routine  Seeing Dr. Leafy Ro      Weight (lb) < 180 lb (81.6 kg)     Continue to work with weight loss center to obtain weight goals        Fall Risk Fall Risk  03/09/2019 07/16/2018 01/16/2018 12/24/2017 11/25/2017  Falls in the past year? 0 0 Yes No Yes  Comment - - - - tripped on a rug  Number falls in past yr: 0 - - - 1  Injury with Fall? 0 - Yes - No  Risk for fall due to : - - Impaired balance/gait;Impaired mobility - -  Risk for fall due to: Comment - - hurt her knee - -  Follow up Falls evaluation completed;Education provided Falls evaluation completed - - -   Is the patient's home free of loose throw rugs in walkways, pet beds, electrical cords, etc?   yes      Grab bars in the bathroom? yes      Handrails on the stairs?   yes      Adequate lighting?   yes  Timed Get Up and Go performed: completed and within normal timeframe; no gait abnormalities noted    Depression Screen PHQ 2/9 Scores 03/09/2019 01/05/2019 06/04/2018 01/19/2018  PHQ - 2 Score 0 0 0 2  PHQ- 9 Score - 1 - 6     Cognitive Function-no cognitive concerns at this time  MMSE - Mini Mental State Exam 01/16/2018  Not completed: (No Data)        Immunization History  Administered Date(s) Administered   Influenza Split 04/04/2011, 03/05/2012   Influenza Whole 04/16/2007, 03/16/2009, 03/13/2010   Influenza, High Dose Seasonal PF 01/31/2019   Influenza,inj,Quad PF,6+ Mos 03/30/2013, 03/10/2014, 03/23/2015, 02/27/2016   Influenza-Unspecified 02/27/2017, 02/28/2017, 01/31/2018   Pneumococcal Conjugate-13 05/21/2016   Pneumococcal Polysaccharide-23 05/23/2017   Td 06/24/1996, 08/06/2007, 11/07/2017   Tdap 11/07/2017   Zoster  05/12/2014   Zoster Recombinat (Shingrix) 01/21/2019    Qualifies for Shingles Vaccine?Discussed and patient will check with pharmacy for coverage.  Patient education handout provided.  Patient has received 1st dose and plans to return for 2nd at [facility]  Topic Date Due   MAMMOGRAM  03/20/2019   OPHTHALMOLOGY EXAM  04/03/2019   HEMOGLOBIN A1C  07/08/2019   FOOT EXAM  01/05/2020   URINE MICROALBUMIN  01/05/2020   COLONOSCOPY  07/28/2024   TETANUS/TDAP  11/08/2027   INFLUENZA VACCINE  Completed   DEXA SCAN  Completed   Hepatitis C Screening  Completed   PNA vac Low Risk Adult  Completed    Cancer Screenings: Lung: Low Dose CT Chest recommended if Age 72-80 years, 30 pack-year currently smoking OR have quit w/in 15years. Patient does not qualify. Breast:  Up to date on Mammogram? Yes   Up to date of Bone Density/Dexa? Yes Colorectal: completed 07/28/14 with Dr. [name]    Plan:    I have personally reviewed and addressed the Medicare Annual Wellness questionnaire and have noted the following in the patients chart:  A. Medical and social history B. Use of alcohol, tobacco or illicit  drugs  C. Current medications and supplements D. Functional ability and status E.  Nutritional status F.  Physical activity G. Advance directives H. List of other physicians I.  Hospitalizations, surgeries, and ER visits in previous 12 months J.  [place] such as hearing and vision if needed, cognitive and depression L. Referrals, records requested, and appointments- none   In addition, I have reviewed and discussed with patient certain preventive protocols, quality metrics, and best practice recommendations. A written personalized care plan for preventive services as well as general preventive health recommendations were provided to patient.   Signed,  Denman George, LPN  Nurse Health Advisor   Nurse Notes: no additional

## 2019-03-09 NOTE — Progress Notes (Signed)
I have reviewed and agree with note, evaluation, plan.   Eowyn Tabone, MD  

## 2019-03-09 NOTE — Patient Instructions (Addendum)
Ms. [name] , Thank you for taking time to come for your Medicare Wellness Visit. I appreciate your ongoing commitment to your health goals. Please review the following plan we discussed and let me know if I can assist you in the future.   Screening recommendations/referrals: Colorectal Screening: completed; last 07/28/14 Mammogram: up to date; last 04/30/18 Bone Density: up to date; last 05/26/18  Vision and Dental Exams: Recommended annual ophthalmology exams for early detection of glaucoma and other disorders of the eye Recommended annual dental exams for proper oral hygiene  Diabetic Exams: Diabetic Eye Exam: yearly  Diabetic Foot Exam: yearly   Vaccinations: Influenza vaccine: completed  Pneumococcal vaccine: up to date; last 05/23/17 Tdap vaccine: up to date; last 11/07/17 Shingles vaccine: Please call your insurance company to determine your out of pocket expense for the Shingrix vaccine. You may receive this vaccine at your local pharmacy.  Advanced directives: Please bring a copy of your POA (Power of Attorney) and/or Living Will to your next appointment.  Goals: Recommend to drink at least 6-8 8oz glasses of water per day.  Next appointment: Please schedule your Annual Wellness Visit with your Nurse Health Advisor in one year.  Preventive Care 41 Years and Older, Female Preventive care refers to lifestyle choices and visits with your health care provider that can promote health and wellness. What does preventive care include?  A yearly physical exam. This is also called an annual well check.  Dental exams once or twice a year.  Routine eye exams. Ask your health care provider how often you should have your eyes checked.  Personal lifestyle choices, including:  Daily care of your teeth and gums.  Regular physical activity.  Eating a healthy diet.  Avoiding tobacco and drug use.  Limiting alcohol use.  Practicing safe sex.  Taking low-dose aspirin every day if  recommended by your health care provider.  Taking vitamin and mineral supplements as recommended by your health care provider. What happens during an annual well check? The services and screenings done by your health care provider during your annual well check will depend on your age, overall health, lifestyle risk factors, and family history of disease. Counseling  Your health care provider may ask you questions about your:  Alcohol use.  Tobacco use.  Drug use.  Emotional well-being.  Home and relationship well-being.  Sexual activity.  Eating habits.  History of falls.  Memory and ability to understand (cognition).  Work and work Statistician.  Reproductive health. Screening  You may have the following tests or measurements:  Height, weight, and BMI.  Blood pressure.  Lipid and cholesterol levels. These may be checked every 5 years, or more frequently if you are over 24 years old.  Skin check.  Lung cancer screening. You may have this screening every year starting at age 11 if you have a 30-pack-year history of smoking and currently smoke or have quit within the past 15 years.  Fecal occult blood test (FOBT) of the stool. You may have this test every year starting at age 10.  Flexible sigmoidoscopy or colonoscopy. You may have a sigmoidoscopy every 5 years or a colonoscopy every 10 years starting at age 7.  Hepatitis C blood test.  Hepatitis B blood test.  Sexually transmitted disease (STD) testing.  Diabetes screening. This is done by checking your blood sugar (glucose) after you have not eaten for a while (fasting). You may have this done every 1-3 years.  Bone density scan. This is done  to screen for osteoporosis. You may have this done starting at age 52.  Mammogram. This may be done every 1-2 years. Talk to your health care provider about how often you should have regular mammograms. Talk with your health care provider about your test results,  treatment options, and if necessary, the need for more tests. Vaccines  Your health care provider may recommend certain vaccines, such as:  Influenza vaccine. This is recommended every year.  Tetanus, diphtheria, and acellular pertussis (Tdap, Td) vaccine. You may need a Td booster every 10 years.  Zoster vaccine. You may need this after age 60.  Pneumococcal 13-valent conjugate (PCV13) vaccine. One dose is recommended after age 44.  Pneumococcal polysaccharide (PPSV23) vaccine. One dose is recommended after age 53. Talk to your health care provider about which screenings and vaccines you need and how often you need them. This information is not intended to replace advice given to you by your health care provider. Make sure you discuss any questions you have with your health care provider. Document Released: 07/07/2015 Document Revised: 02/28/2016 Document Reviewed: 04/11/2015 Elsevier Interactive Patient Education  2017 [employer] Prevention in the Home Falls can cause injuries. They can happen to people of all ages. There are many things you can do to make your home safe and to help prevent falls. What can I do on the outside of my home?  Regularly fix the edges of walkways and driveways and fix any cracks.  Remove anything that might make you trip as you walk through a door, such as a raised step or threshold.  Trim any bushes or trees on the path to your home.  Use bright outdoor lighting.  Clear any walking paths of anything that might make someone trip, such as rocks or tools.  Regularly check to see if handrails are loose or broken. Make sure that both sides of any steps have handrails.  Any raised decks and porches should have guardrails on the edges.  Have any leaves, snow, or ice cleared regularly.  Use sand or salt on walking paths during winter.  Clean up any spills in your garage right away. This includes oil or grease spills. What can I do in the  bathroom?  Use night lights.  Install grab bars by the toilet and in the tub and shower. Do not use towel bars as grab bars.  Use non-skid mats or decals in the tub or shower.  If you need to sit down in the shower, use a plastic, non-slip stool.  Keep the floor dry. Clean up any water that spills on the floor as soon as it happens.  Remove soap buildup in the tub or shower regularly.  Attach bath mats securely with double-sided non-slip rug tape.  Do not have throw rugs and other things on the floor that can make you trip. What can I do in the bedroom?  Use night lights.  Make sure that you have a light by your bed that is easy to reach.  Do not use any sheets or blankets that are too big for your bed. They should not hang down onto the floor.  Have a firm chair that has side arms. You can use this for support while you get dressed.  Do not have throw rugs and other things on the floor that can make you trip. What can I do in the kitchen?  Clean up any spills right away.  Avoid walking on wet floors.  Keep items  that you use a lot in easy-to-reach places.  If you need to reach something above you, use a strong step stool that has a grab bar.  Keep electrical cords out of the way.  Do not use floor polish or wax that makes floors slippery. If you must use wax, use non-skid floor wax.  Do not have throw rugs and other things on the floor that can make you trip. What can I do with my stairs?  Do not leave any items on the stairs.  Make sure that there are handrails on both sides of the stairs and use them. Fix handrails that are broken or loose. Make sure that handrails are as long as the stairways.  Check any carpeting to make sure that it is firmly attached to the stairs. Fix any carpet that is loose or worn.  Avoid having throw rugs at the top or bottom of the stairs. If you do have throw rugs, attach them to the floor with carpet tape.  Make sure that you have a  light switch at the top of the stairs and the bottom of the stairs. If you do not have them, ask someone to add them for you. What else can I do to help prevent falls?  Wear shoes that:  Do not have high heels.  Have rubber bottoms.  Are comfortable and fit you well.  Are closed at the toe. Do not wear sandals.  If you use a stepladder:  Make sure that it is fully opened. Do not climb a closed stepladder.  Make sure that both sides of the stepladder are locked into place.  Ask someone to hold it for you, if possible.  Clearly mark and make sure that you can see:  Any grab bars or handrails.  First and last steps.  Where the edge of each step is.  Use tools that help you move around (mobility aids) if they are needed. These include:  Canes.  Walkers.  Scooters.  Crutches.  Turn on the lights when you go into a dark area. Replace any light bulbs as soon as they burn out.  Set up your furniture so you have a clear path. Avoid moving your furniture around.  If any of your floors are uneven, fix them.  If there are any pets around you, be aware of where they are.  Review your medicines with your doctor. Some medicines can make you feel dizzy. This can increase your chance of falling. Ask your doctor what other things that you can do to help prevent falls. This information is not intended to replace advice given to you by your health care provider. Make sure you discuss any questions you have with your health care provider. Document Released: 04/06/2009 Document Revised: 11/16/2015 Document Reviewed: 07/15/2014 Elsevier Interactive Patient Education  2017 [employer].

## 2019-03-11 ENCOUNTER — Ambulatory Visit (INDEPENDENT_AMBULATORY_CARE_PROVIDER_SITE_OTHER): Payer: Medicare Other | Admitting: Cardiology

## 2019-03-11 ENCOUNTER — Encounter (INDEPENDENT_AMBULATORY_CARE_PROVIDER_SITE_OTHER): Payer: Self-pay | Admitting: Physician Assistant

## 2019-03-11 ENCOUNTER — Encounter: Payer: Self-pay | Admitting: Cardiology

## 2019-03-11 ENCOUNTER — Other Ambulatory Visit: Payer: Self-pay

## 2019-03-11 ENCOUNTER — Ambulatory Visit (INDEPENDENT_AMBULATORY_CARE_PROVIDER_SITE_OTHER): Payer: Medicare Other | Admitting: Physician Assistant

## 2019-03-11 VITALS — BP 135/68 | HR 56 | Temp 98.8°F | Ht 66.0 in | Wt 202.0 lb

## 2019-03-11 VITALS — BP 132/60 | HR 58 | Ht 66.0 in | Wt 208.2 lb

## 2019-03-11 DIAGNOSIS — I10 Essential (primary) hypertension: Secondary | ICD-10-CM | POA: Diagnosis not present

## 2019-03-11 DIAGNOSIS — Z6832 Body mass index (BMI) 32.0-32.9, adult: Secondary | ICD-10-CM

## 2019-03-11 DIAGNOSIS — E669 Obesity, unspecified: Secondary | ICD-10-CM

## 2019-03-11 DIAGNOSIS — E119 Type 2 diabetes mellitus without complications: Secondary | ICD-10-CM | POA: Diagnosis not present

## 2019-03-11 DIAGNOSIS — Z953 Presence of xenogenic heart valve: Secondary | ICD-10-CM

## 2019-03-11 DIAGNOSIS — E782 Mixed hyperlipidemia: Secondary | ICD-10-CM

## 2019-03-11 NOTE — Progress Notes (Signed)
Cardiology Office Note:    Date:  03/11/2019   ID:  [name], DOB 07-Mar-1951, MRN [id number]  PCP:  [name], MD  Cardiologist:  Dr Meda Coffee  Referring MD: [name], MD   Reason for visit: 1 year follow-up  History of Present Illness:    [name] is a 68 y.o. female with a hx of Diabetes, hyperlipidemia, OSA on CPAP, and aortic valve replacement with bioprosthetic bovine pericardial tissue valve 08/23/16. Patient had only minor nonobstructive CAD on heart catheterization 08/02/16 done in preparation for AVR. Also right heart cath showed normal right heart hemodynamics. Echocardiogram showed normal EF of 60-65% and grade 1 diastolic dysfunction. Pt is feeling very well and much better after the valve replacement than before. Completed 8 weeks of cardiac rehab and working out at [employer] (has taken a week off due to having a mole on her leg removed).  June 12, 2018 -the patient is coming after 1 year, she is feeling and looking great, she completed cardiac rehab however is not exercising on a regular basis now.  She joined the wellness clinic at [facility] and has lost 25 pounds so far but still wants to lose another 30.  She denies any chest pain, shortness of breath, no orthopnea or proximal nocturnal dyspnea.  She has been compliant with her meds and has no side effects.  03/11/2019 -this is 9 months follow-up, the patient is doing great, she started house diet program: Where she is following a diet and exercise regimen and was able to lose 30 pounds in the last 6 months.  She denies any chest pain or shortness of breath.  She denies any lower extremity edema orthopnea proximal nocturnal dyspnea.  She has been compliant with her meds and has no side effects.  Past Medical History:  Diagnosis Date  . Allergy   . Anxiety   . Arthritis    back & knee  . Asthma     mild per pt shows up with resp illness  . Chronic diastolic congestive heart failure ([place])    . Colon polyps   . Constipation   . Diabetes mellitus without complication ([place])   . Dyspnea   . Gastric polyps   . Gastroparesis   . GERD (gastroesophageal reflux disease)   . Headache    sinus headaches and migraines at times  . Heart murmur   . History of migraine headaches   . HTN (hypertension)   . Hyperlipidemia   . IBS (irritable bowel syndrome)   . Joint pain   . Lumbar disc disease   . S/P aortic valve replacement with bioprosthetic valve 08/23/2016   25 mm Edwards Intuity rapid-deployment bovine pericardial tissue valve via partial upper mini sternotomy  . Sleep apnea   . TIA (transient ischemic attack)     Past Surgical History:  Procedure Laterality Date  . ABDOMINAL HYSTERECTOMY    . AORTIC VALVE REPLACEMENT N/A 08/23/2016   Procedure: AORTIC VALVE REPLACEMENT (AVR) - using partial Upper Sternotomy- 35mm Edwards Intuity Aortic Valve used;  Surgeon: Rexene Alberts, MD;  Location: [place];  Service: Open Heart Surgery;  Laterality: N/A;  . BLADDER SUSPENSION  2007  . BREAST ENHANCEMENT SURGERY  1980  . CARPAL TUNNEL RELEASE Bilateral   . COLONOSCOPY    . DORSAL COMPARTMENT RELEASE Right 09/15/2014   Procedure: RIGHT WRIST DEQUERVAINS RELEASE ;  Surgeon: Kathryne Hitch, MD;  Location: [place];  Service: Orthopedics;  Laterality: Right;  . ESOPHAGOGASTRODUODENOSCOPY    . KNEE ARTHROSCOPY  04/15/2012   Procedure: ARTHROSCOPY KNEE;  Surgeon: [name], MD;  Location: [place];  Service: Orthopedics;  Laterality: Right;  . LAPAROSCOPIC CHOLECYSTECTOMY    . LASIK    . OOPHORECTOMY    . PALATE TO GINGIVA GRAFT  2017  . RIGHT/LEFT HEART CATH AND CORONARY ANGIOGRAPHY N/A 08/02/2016   Procedure: Right/Left Heart Cath and Coronary Angiography;  Surgeon: Sherren Mocha, MD;  Location: [place] [facility];  Service: Cardiovascular;  Laterality: N/A;  . ROTATOR CUFF REPAIR Left   . TEE WITHOUT CARDIOVERSION N/A 08/23/2016   Procedure: TRANSESOPHAGEAL  ECHOCARDIOGRAM (TEE);  Surgeon: Rexene Alberts, MD;  Location: [place];  Service: Open Heart Surgery;  Laterality: N/A;  . TOTAL KNEE ARTHROPLASTY  04/15/2012   Procedure: TOTAL KNEE ARTHROPLASTY;  Surgeon: [name], MD;  Location: [place];  Service: Orthopedics;  Laterality: Right;  . TRIGGER FINGER RELEASE Right 04/20/2015   Procedure: RIGHT TRIGGER FINGER RELEASE (TENDON SHEALTH INCISION) ;  Surgeon: [name], MD;  Location: [place];  Service: Orthopedics;  Laterality: Right;    Current Medications: Current Meds  Medication Sig  . aspirin EC 81 MG tablet Take 1 tablet (81 mg total) by mouth daily.  [name] atorvastatin (LIPITOR) 40 MG tablet TAKE 1 TABLET (40 MG TOTAL) BY MOUTH DAILY AT 6 PM.  . Biotin 5000 MCG TABS Take 5,000 mcg by mouth daily.   . calcium-vitamin D (OSCAL WITH D) 500-200 MG-UNIT per tablet Take 2 tablets by mouth daily.   . Cholecalciferol (VITAMIN D) 125 MCG (5000 UT) CAPS Take 5,000 Units by mouth daily.  . clindamycin (CLEOCIN) 300 MG capsule TAKE 2 CAPSULES BY MOUTH 1 HOUR PRIOR TOO DENTAL CLEANING.  [name] clopidogrel (PLAVIX) 75 MG tablet TAKE 1 TABLET BY MOUTH EVERY DAY  . demeclocycline (DECLOMYCIN) 300 MG tablet Take 1 tablet (300 mg total) by mouth daily.  . fluticasone (FLONASE) 50 MCG/ACT nasal spray Place 2 sprays into both nostrils daily.   [name] ipratropium (ATROVENT) 0.03 % nasal spray Place 2 sprays into both nostrils every 12 (twelve) hours.  . Lancets (ONETOUCH ULTRASOFT) lancets Use to test blood sugars daily. Dx: E11.9  . loratadine (CLARITIN) 10 MG tablet Take 10 mg by mouth at bedtime.   . Melatonin 5 MG TABS Take by mouth.  . metFORMIN (GLUCOPHAGE) 500 MG tablet Take 500 mg by mouth 2 (two) times daily with a meal. 1tab at breakfast and1/2 at dinner  . metoCLOPramide (REGLAN) 10 MG tablet TAKE 1/2 TABLET BY MOUTH 4 TIMES A DAY  . metoprolol tartrate (LOPRESSOR) 25 MG tablet Take 0.5 tablets (12.5 mg total) by mouth 2 (two) times  daily.  . Multiple Vitamin (MULTIVITAMIN WITH MINERALS) TABS tablet Take 1 tablet by mouth daily.  . naproxen sodium (ALEVE) 220 MG tablet Take 220 mg by mouth.  [name] omeprazole (PRILOSEC OTC) 20 MG tablet Take 40 mg by mouth daily with breakfast.  . ONETOUCH VERIO test strip USE TO TEST BLOOD SUGARS DAILY.  . Probiotic Product (ALIGN) 4 MG CAPS Take 4 mg by mouth daily.   . psyllium (METAMUCIL SMOOTH TEXTURE) 28 % packet Take 1 packet by mouth daily before breakfast.   . venlafaxine XR (EFFEXOR-XR) 75 MG 24 hr capsule Take 1 capsule (75 mg total) by mouth daily with breakfast.     Allergies:   Augmentin [amoxicillin-pot clavulanate], Erythromycin, and Penicillins   Social History  Socioeconomic History  . Marital status: Married    Spouse name: [name]  . Number of children: 1  . Years of education: Not on file  . Highest education level: Not on file  Occupational History  . Occupation: Retired, Curator: RETIRED  Social Needs  . Financial resource strain: Not on file  . Food insecurity    Worry: Not on file    Inability: Not on file  . Transportation needs    Medical: Not on file    Non-medical: Not on file  Tobacco Use  . Smoking status: Never Smoker  . Smokeless tobacco: Never Used  Substance and Sexual Activity  . Alcohol use: No  . Drug use: No  . Sexual activity: Yes  Lifestyle  . Physical activity    Days per week: Not on file    Minutes per session: Not on file  . Stress: Not on file  Relationships  . Social Herbalist on phone: Not on file    Gets together: Not on file    Attends religious service: Not on file    Active member of club or organization: Not on file    Attends meetings of clubs or organizations: Not on file    Relationship status: Not on file  Other Topics Concern  . Not on file  Social History Narrative   She lives with husband (1978) and two dogs. Step-son [name] (1971-psychiatrist in [place]). No  grandkids. 2 dogs-sheltie/collie mix and border collie mix      Highest level of education:  Master in education   She is retired Animal nutritionist x 32 years.      Hobbies: time with dogs              Family History: The patient's family history includes Alcohol abuse in her father; Anxiety disorder in her mother; COPD in her mother; Colon polyps in her maternal aunt and mother; Heart disease in her father; Irritable bowel syndrome in her mother. There is no history of Other. ROS:   Please see the history of present illness.     All other systems reviewed and are negative.  EKGs/Labs/Other Studies Reviewed:    The following studies were reviewed today:  Right/Left Heart Cath and Coronary Angiography 08/02/16   1. Minor nonobstructive CAD 2. Normal right heart hemodynamics (including normal PCWP) 3. Known severe aortic stenosis    Intraoperative TEE 08/23/16  Left ventricle: Normal cavity size. Concentric hypertrophy of moderate severity. LV systolic function is normal with an EF of 60-65%. There are no obvious wall motion abnormalities.  Septum: No Patent Foramen Ovale present.  Left atrium: Patent foramen ovale not present.  Aortic valve: The valve is bicuspid. Severe valve thickening present. Severe valve calcification present. Moderately decreased leaflet separation. Severe stenosis. No regurgitation. No AV vegetation. No evidence of papillary fibroelastoma.  Aorta: The ascending aorta is mildly dilated.  Mitral valve: No leaflet thickening and calcification present. Mild mitral annular calcification.  Right ventricle: Normal cavity size, wall thickness and ejection fraction. Tricuspid valve: Trace regurgitation. The tricuspid valve regurgitation jet is central.   TTE: 08/26/2017   Left ventricle: The cavity size was mildly dilated. Wall   thickness was increased in a pattern of mild LVH. Systolic   function was normal. The estimated ejection fraction was in the    range of 60% to 65%. Left ventricular diastolic function   parameters were normal. - Aortic valve: patient  had a Edwards 25 mm Intuity Elite rapid   deployment bovine pericardial valve placed 09/12/16. 05/01/17 mean   gradient was 6 mmHg peak gradient 12 mmHg. Gradients have   increased now 17 mmHg and 34 mmHg peak. There is a mild leak seen   through the basal stented portion of the valve appears at around   10:00 on BSA views - Mitral valve: There was mild regurgitation. - Atrial septum: No defect or patent foramen ovale was identified.  EKG:  EKG is ordered today and personally reviewed, which shows sinus bradycardia 58 bpm, LVH unchanged from prior.    Recent Labs: 06/04/2018: TSH 3.38 01/05/2019: Hemoglobin 11.6; Platelets 292.0 02/11/2019: ALT 16; BUN 18; Creatinine, Ser 0.69; Potassium 5.4; Sodium 137   Recent Lipid Panel    Component Value Date/Time   CHOL 122 02/11/2019 1103   TRIG 58 02/11/2019 1103   HDL 55 02/11/2019 1103   CHOLHDL 2 06/04/2018 1012   VLDL 14.0 06/04/2018 1012   LDLCALC 55 02/11/2019 1103   LDLDIRECT 58.0 11/25/2017 1156   Physical Exam:    VS:  BP 132/60   Pulse (!) 58   Ht 5\' 6"  (1.676 m)   Wt 208 lb 3.2 oz (94.4 kg)   LMP  (LMP Unknown)   SpO2 98%   BMI 33.60 kg/m     Wt Readings from Last 3 Encounters:  03/11/19 208 lb 3.2 oz (94.4 kg)  03/11/19 202 lb (91.6 kg)  03/09/19 205 lb (93 kg)    GEN: Well nourished, well developed in no acute distress HEENT: Normal NECK: No JVD; No carotid bruits LYMPHATICS: No lymphadenopathy CARDIAC: RRR, 3 out of 6 systolic and 2 out of 6 diastolic murmur, rubs, gallops RESPIRATORY:  Clear to auscultation without rales, wheezing or rhonchi  ABDOMEN: Soft, non-tender, non-distended MUSCULOSKELETAL:  No edema; No deformity  SKIN: Warm and dry NEUROLOGIC:  Alert and oriented x 3 PSYCHIATRIC:  Normal affect   ASSESSMENT:    1. Mixed hyperlipidemia   2. Essential hypertension   3. S/P minimally  invasive aortic valve replacement with bioprosthetic valve      PLAN:    In order of problems listed above:  S/P AVR with bovine tissue valve 08/23/16 -The patient continues to exercise and has lost 30 pounds since surgery.  She feels great, has no symptoms of CHF or angina.   -Her most recent echocardiogram in January 2019 showed mildly elevated transaortic gradients with peak of 34 and a mean of 17 mmHg and mild paravalvular leak.  -Repeat echocardiogram is scheduled for December 2020.  Chronic diastolic CHF -She is euvolemic off Lasix, functional status much improved with exercise and weight loss.  Hyperlipidemia -All lipids at goal on atorvastatin.  OSA -Using CPAP consistently every night.   Hypertension -Well-controlled on metoprolol  Medication Adjustments/Labs and Tests Ordered: Current medicines are reviewed at length with the patient today.  Concerns regarding medicines are outlined above. Labs and tests ordered and medication changes are outlined in the patient instructions below:  Patient Instructions  Medication Instructions:   Your physician recommends that you continue on your current medications as directed. Please refer to the Current Medication list given to you today.  If you need a refill on your cardiac medications before your next appointment, please call your pharmacy.     Follow-Up: At [facility], you and your health needs are our priority.  As part of our continuing mission to provide you with exceptional heart care, we have  created designated Provider Care Teams.  These Care Teams include your primary Cardiologist (physician) and Advanced Practice Providers (APPs -  Physician Assistants and Nurse Practitioners) who all work together to provide you with the care you need, when you need it. You will need a follow up appointment in 6 months.  Please call our office 2 months in advance to schedule this appointment.  You may see Ena Dawley, MD or one  of the following Advanced Practice Providers on your designated Care Team:   [place], PA-C Melina Copa, PA-C . Ermalinda Barrios, PA-C       Signed, Ena Dawley, MD  03/11/2019 5:03 PM    [place]

## 2019-03-11 NOTE — Patient Instructions (Signed)
Medication Instructions:   Your physician recommends that you continue on your current medications as directed. Please refer to the Current Medication list given to you today.  If you need a refill on your cardiac medications before your next appointment, please call your pharmacy.       Follow-Up: At [facility], you and your health needs are our priority.  As part of our continuing mission to provide you with exceptional heart care, we have created designated Provider Care Teams.  These Care Teams include your primary Cardiologist (physician) and Advanced Practice Providers (APPs -  Physician Assistants and Nurse Practitioners) who all work together to provide you with the care you need, when you need it. You will need a follow up appointment in 6 months.  Please call our office 2 months in advance to schedule this appointment.  You may see Katarina Nelson, MD or one of the following Advanced Practice Providers on your designated Care Team:   Brittainy Simmons, PA-C Dayna Dunn, PA-C . Michele Lenze, PA-C     

## 2019-03-16 ENCOUNTER — Encounter (INDEPENDENT_AMBULATORY_CARE_PROVIDER_SITE_OTHER): Payer: Self-pay | Admitting: Physician Assistant

## 2019-03-16 NOTE — Progress Notes (Signed)
Office: [phone]  /  Fax: [phone]   HPI:   Chief Complaint: OBESITY [name] is here to discuss her progress with her obesity treatment plan. She is on the  follow the Category 3 plan and is following her eating plan approximately 95 % of the time. She states she is doing cardio and strength exercise 60 minutes 3 times per week. [name] reports that she has increased her protein during her lunch. She has started working out again. Her weight is 202 lb (91.6 kg) today and has had a weight gain of 1 pound over a period of 2 weeks since her last visit. She has lost 18 lbs since starting treatment with [name].  Diabetes II [name] has a diagnosis of diabetes type II. She is on metformin. [name] states fasting BGs range between 119 and 132 and she denies nausea, vomiting, diarrhea or hypoglycemia. She has been working on intensive lifestyle modifications including diet, exercise, and weight loss to help control her blood glucose levels.  ASSESSMENT AND PLAN:  Type 2 diabetes mellitus without complication, without long-term current use of insulin (HCC)  Class 1 obesity with serious comorbidity and body mass index (BMI) of 32.0 to 32.9 in adult, unspecified obesity type  PLAN:  Diabetes II [name] has been given extensive diabetes education by myself today including ideal fasting and post-prandial blood glucose readings, individual ideal Hgb A1c goals and hypoglycemia prevention. We discussed the importance of good blood sugar control to decrease the likelihood of diabetic complications such as nephropathy, neuropathy, limb loss, blindness, coronary artery disease, and death. We discussed the importance of intensive lifestyle modification including diet, exercise and weight loss as the first line treatment for diabetes. [name] will continue with medications and she will follow up at the agreed upon time.  Obesity [name] is currently in the action stage of change. As  such, her goal is to continue with weight loss efforts She has agreed to follow the Category 3 plan [name] has been instructed to work up to a goal of 150 minutes of combined cardio and strengthening exercise per week for weight loss and overall health benefits. We discussed the following Behavioral Modification Strategies today: keeping healthy foods in the home and work on meal planning and easy cooking plans  We will check indirect calorimetry at the next visit.  [name] has agreed to follow up with our clinic in 2 weeks. She was informed of the importance of frequent follow up visits to maximize her success with intensive lifestyle modifications for her multiple health conditions.  ALLERGIES: Allergies  Allergen Reactions   Augmentin [Amoxicillin-Pot Clavulanate] Rash    rash   Erythromycin Nausea Only   Penicillins Itching and Rash    Has patient had a PCN reaction causing immediate rash, facial/tongue/throat swelling, SOB or lightheadedness with hypotension: No Has patient had a PCN reaction causing severe rash involving mucus membranes or skin necrosis: No Has patient had a PCN reaction that required hospitalization: No Has patient had a PCN reaction occurring within the last 10 years: No  If all of the above answers are "NO", then may proceed with Cephalosporin use.     MEDICATIONS: Current Outpatient Medications on File Prior to Visit  Medication Sig Dispense Refill   aspirin EC 81 MG tablet Take 1 tablet (81 mg total) by mouth daily. 90 tablet 3   atorvastatin (LIPITOR) 40 MG tablet TAKE 1 TABLET (40 MG TOTAL) BY MOUTH DAILY AT 6 PM. 90 tablet 3   Biotin 5000  MCG TABS Take 5,000 mcg by mouth daily.      calcium-vitamin D (OSCAL WITH D) 500-200 MG-UNIT per tablet Take 2 tablets by mouth daily.      Cholecalciferol (VITAMIN D) 125 MCG (5000 UT) CAPS Take 5,000 Units by mouth daily.     clindamycin (CLEOCIN) 300 MG capsule TAKE 2 CAPSULES BY MOUTH 1 HOUR PRIOR  TOO DENTAL CLEANING. 2 capsule 0   clopidogrel (PLAVIX) 75 MG tablet TAKE 1 TABLET BY MOUTH EVERY DAY 90 tablet 3   demeclocycline (DECLOMYCIN) 300 MG tablet Take 1 tablet (300 mg total) by mouth daily. 30 tablet 11   fluticasone (FLONASE) 50 MCG/ACT nasal spray Place 2 sprays into both nostrils daily.      ipratropium (ATROVENT) 0.03 % nasal spray Place 2 sprays into both nostrils every 12 (twelve) hours.     Lancets (ONETOUCH ULTRASOFT) lancets Use to test blood sugars daily. Dx: E11.9 100 each 12   loratadine (CLARITIN) 10 MG tablet Take 10 mg by mouth at bedtime.      Melatonin 5 MG TABS Take by mouth.     metoCLOPramide (REGLAN) 10 MG tablet TAKE 1/2 TABLET BY MOUTH 4 TIMES A DAY 60 tablet 5   metoprolol tartrate (LOPRESSOR) 25 MG tablet Take 0.5 tablets (12.5 mg total) by mouth 2 (two) times daily. 45 tablet 3   Multiple Vitamin (MULTIVITAMIN WITH MINERALS) TABS tablet Take 1 tablet by mouth daily.     naproxen sodium (ALEVE) 220 MG tablet Take 220 mg by mouth.     omeprazole (PRILOSEC OTC) 20 MG tablet Take 40 mg by mouth daily with breakfast.     ONETOUCH VERIO test strip USE TO TEST BLOOD SUGARS DAILY. 100 strip 4   Probiotic Product (ALIGN) 4 MG CAPS Take 4 mg by mouth daily.      psyllium (METAMUCIL SMOOTH TEXTURE) 28 % packet Take 1 packet by mouth daily before breakfast.      venlafaxine XR (EFFEXOR-XR) 75 MG 24 hr capsule Take 1 capsule (75 mg total) by mouth daily with breakfast. 90 capsule 2   No current facility-administered medications on file prior to visit.     PAST MEDICAL HISTORY: Past Medical History:  Diagnosis Date   Allergy    Anxiety    Arthritis    back & knee   Asthma     mild per pt shows up with resp illness   Chronic diastolic congestive heart failure (HCC)    Colon polyps    Constipation    Diabetes mellitus without complication (HCC)    Dyspnea    Gastric polyps    Gastroparesis    GERD (gastroesophageal reflux  disease)    Headache    sinus headaches and migraines at times   Heart murmur    History of migraine headaches    HTN (hypertension)    Hyperlipidemia    IBS (irritable bowel syndrome)    Joint pain    Lumbar disc disease    S/P aortic valve replacement with bioprosthetic valve 08/23/2016   25 mm Edwards Intuity rapid-deployment bovine pericardial tissue valve via partial upper mini sternotomy   Sleep apnea    TIA (transient ischemic attack)     PAST SURGICAL HISTORY: Past Surgical History:  Procedure Laterality Date   ABDOMINAL HYSTERECTOMY     AORTIC VALVE REPLACEMENT N/A 08/23/2016   Procedure: AORTIC VALVE REPLACEMENT (AVR) - using partial Upper Sternotomy- 70mm Edwards Intuity Aortic Valve used;  Surgeon: Rexene Alberts, MD;  Location: [facility];  Service: Open Heart Surgery;  Laterality: N/A;   BLADDER SUSPENSION  2007   BREAST ENHANCEMENT SURGERY  1980   CARPAL TUNNEL RELEASE Bilateral    COLONOSCOPY     DORSAL COMPARTMENT RELEASE Right 09/15/2014   Procedure: RIGHT WRIST DEQUERVAINS RELEASE ;  Surgeon: Kathryne Hitch, MD;  Location: [place];  Service: Orthopedics;  Laterality: Right;   ESOPHAGOGASTRODUODENOSCOPY     KNEE ARTHROSCOPY  04/15/2012   Procedure: ARTHROSCOPY KNEE;  Surgeon: [name], MD;  Location: [place];  Service: Orthopedics;  Laterality: Right;   LAPAROSCOPIC CHOLECYSTECTOMY     LASIK     OOPHORECTOMY     PALATE TO GINGIVA GRAFT  2017   RIGHT/LEFT HEART CATH AND CORONARY ANGIOGRAPHY N/A 08/02/2016   Procedure: Right/Left Heart Cath and Coronary Angiography;  Surgeon: Sherren Mocha, MD;  Location: [facility];  Service: Cardiovascular;  Laterality: N/A;   ROTATOR CUFF REPAIR Left    TEE WITHOUT CARDIOVERSION N/A 08/23/2016   Procedure: TRANSESOPHAGEAL ECHOCARDIOGRAM (TEE);  Surgeon: Rexene Alberts, MD;  Location: [place];  Service: Open Heart Surgery;  Laterality: N/A;   TOTAL KNEE ARTHROPLASTY  04/15/2012    Procedure: TOTAL KNEE ARTHROPLASTY;  Surgeon: [name], MD;  Location: [place];  Service: Orthopedics;  Laterality: Right;   TRIGGER FINGER RELEASE Right 04/20/2015   Procedure: RIGHT TRIGGER FINGER RELEASE (TENDON SHEALTH INCISION) ;  Surgeon: [name], MD;  Location: [place];  Service: Orthopedics;  Laterality: Right;    SOCIAL HISTORY: Social History   Tobacco Use   Smoking status: Never Smoker   Smokeless tobacco: Never Used  Substance Use Topics   Alcohol use: No   Drug use: No    FAMILY HISTORY: Family History  Problem Relation Age of Onset   COPD Mother    Colon polyps Mother    Irritable bowel syndrome Mother    Anxiety disorder Mother    Heart disease Father    Alcohol abuse Father    Colon polyps Maternal Aunt    Other Neg Hx        hyponatremia    ROS: Review of Systems  Constitutional: Negative for weight loss.  Gastrointestinal: Negative for diarrhea, nausea and vomiting.  Endo/Heme/Allergies:       Negative for hypoglycemia    PHYSICAL EXAM: Blood pressure 135/68, pulse (!) 56, temperature 98.8 F (37.1 C), temperature source Oral, height 5\' 6"  (1.676 m), weight 202 lb (91.6 kg), SpO2 99 %. Body mass index is 32.6 kg/m. Physical Exam Vitals signs reviewed.  Constitutional:      Appearance: Normal appearance. She is well-developed. She is obese.  Cardiovascular:     Rate and Rhythm: Normal rate.  Pulmonary:     Effort: Pulmonary effort is normal.  Musculoskeletal: Normal range of motion.  Skin:    General: Skin is warm and dry.  Neurological:     Mental Status: She is alert and oriented to person, place, and time.  Psychiatric:        Mood and Affect: Mood normal.        Behavior: Behavior normal.     RECENT LABS AND TESTS: BMET    Component Value Date/Time   NA 137 02/11/2019 1103   K 5.4 (H) 02/11/2019 1103   CL 99 02/11/2019 1103   CO2 23 02/11/2019 1103   GLUCOSE 124 (H) 02/11/2019 1103     GLUCOSE 86 01/20/2019 1543   BUN  18 02/11/2019 1103   CREATININE 0.69 02/11/2019 1103   CALCIUM 9.3 02/11/2019 1103   GFRNONAA 90 02/11/2019 1103   GFRAA 103 02/11/2019 1103   Lab Results  Component Value Date   HGBA1C 5.7 (A) 01/05/2019   HGBA1C 6.1 06/04/2018   HGBA1C 6.6 (H) 01/19/2018   HGBA1C 6.2 (A) 11/25/2017   HGBA1C 6.4 05/23/2017   Lab Results  Component Value Date   INSULIN 12.1 01/19/2018   CBC    Component Value Date/Time   WBC 9.2 01/05/2019 1028   RBC 4.44 01/05/2019 1028   HGB 11.6 (L) 01/05/2019 1028   HGB 11.7 01/19/2018 0959   HCT 35.7 (L) 01/05/2019 1028   HCT 35.7 01/19/2018 0959   PLT 292.0 01/05/2019 1028   PLT 358 04/24/2017 1231   MCV 80.5 01/05/2019 1028   MCV 81 01/19/2018 0959   MCH 26.5 (L) 01/19/2018 0959   MCH 27.4 08/26/2016 0248   MCHC 32.4 01/05/2019 1028   RDW 15.3 01/05/2019 1028   RDW 14.9 01/19/2018 0959   LYMPHSABS 2.9 01/19/2018 0959   MONOABS 0.6 05/11/2015 0836   EOSABS 0.1 01/19/2018 0959   BASOSABS 0.0 01/19/2018 0959   Iron/TIBC/Ferritin/ %Sat No results found for: IRON, TIBC, FERRITIN, IRONPCTSAT Lipid Panel     Component Value Date/Time   CHOL 122 02/11/2019 1103   TRIG 58 02/11/2019 1103   HDL 55 02/11/2019 1103   CHOLHDL 2 06/04/2018 1012   VLDL 14.0 06/04/2018 1012   LDLCALC 55 02/11/2019 1103   LDLDIRECT 58.0 11/25/2017 1156   Hepatic Function Panel     Component Value Date/Time   PROT 6.7 02/11/2019 1103   ALBUMIN 4.6 02/11/2019 1103   AST 26 02/11/2019 1103   ALT 16 02/11/2019 1103   ALKPHOS 151 (H) 02/11/2019 1103   BILITOT 0.3 02/11/2019 1103   BILIDIR 0.1 05/11/2015 0836      Component Value Date/Time   TSH 3.38 06/04/2018 1012   TSH 5.300 (H) 01/19/2018 0959   TSH 3.390 04/24/2017 1231     Ref. Range 01/05/2019 10:28  VITD Latest Ref Range: 30.00 - 100.00 ng/mL 56.90    OBESITY BEHAVIORAL INTERVENTION VISIT  Today's visit was # 26  Starting weight: 220 lbs Starting date:  01/19/2018 Today's weight : 202 lbs Today's date: 03/11/2019 Total lbs lost to date: 18    03/11/2019 1000  Height 5\' 6"  (1.676 m)  Weight 202 lb (91.6 kg)  BMI (Calculated) 32.62   Body Fat % 47.5 %  Total Body Water (lbs) 75.6 lbs    ASK: We discussed the diagnosis of obesity with [name] today and [name] agreed to give [name] permission to discuss obesity behavioral modification therapy today.  ASSESS: [name] has the diagnosis of obesity and her BMI today is 32.62 [name] is in the action stage of change   ADVISE: [name] was educated on the multiple health risks of obesity as well as the benefit of weight loss to improve her health. She was advised of the need for long term treatment and the importance of lifestyle modifications to improve her current health and to decrease her risk of future health problems.  AGREE: Multiple dietary modification options and treatment options were discussed and  [name] agreed to follow the recommendations documented in the above note.  ARRANGE: [name] was educated on the importance of frequent visits to treat obesity as outlined per CMS and USPSTF guidelines and agreed to schedule her next follow up appointment today.  Burnell Blanks  Valere Dross, am acting as Location manager for [name], PA-C\ I, [name], PA-C have reviewed above note and agree with its content

## 2019-03-17 ENCOUNTER — Encounter (INDEPENDENT_AMBULATORY_CARE_PROVIDER_SITE_OTHER): Payer: Self-pay | Admitting: Physician Assistant

## 2019-03-17 NOTE — Telephone Encounter (Signed)
Please review

## 2019-03-17 NOTE — Telephone Encounter (Signed)
Please advise 

## 2019-03-27 LAB — HM DIABETES EYE EXAM

## 2019-03-30 ENCOUNTER — Ambulatory Visit (INDEPENDENT_AMBULATORY_CARE_PROVIDER_SITE_OTHER): Payer: Medicare Other | Admitting: Physician Assistant

## 2019-04-01 ENCOUNTER — Ambulatory Visit (INDEPENDENT_AMBULATORY_CARE_PROVIDER_SITE_OTHER): Payer: Medicare Other | Admitting: Physician Assistant

## 2019-04-01 ENCOUNTER — Other Ambulatory Visit: Payer: Self-pay

## 2019-04-01 ENCOUNTER — Encounter (INDEPENDENT_AMBULATORY_CARE_PROVIDER_SITE_OTHER): Payer: Self-pay | Admitting: Physician Assistant

## 2019-04-01 ENCOUNTER — Encounter: Payer: Self-pay | Admitting: Physician Assistant

## 2019-04-01 VITALS — BP 117/58 | HR 67 | Temp 98.8°F | Ht 66.0 in | Wt 196.0 lb

## 2019-04-01 DIAGNOSIS — E66811 Obesity, class 1: Secondary | ICD-10-CM

## 2019-04-01 DIAGNOSIS — E669 Obesity, unspecified: Secondary | ICD-10-CM

## 2019-04-01 DIAGNOSIS — R0602 Shortness of breath: Secondary | ICD-10-CM | POA: Diagnosis not present

## 2019-04-01 DIAGNOSIS — Z6831 Body mass index (BMI) 31.0-31.9, adult: Secondary | ICD-10-CM | POA: Diagnosis not present

## 2019-04-01 DIAGNOSIS — E119 Type 2 diabetes mellitus without complications: Secondary | ICD-10-CM | POA: Diagnosis not present

## 2019-04-05 NOTE — Progress Notes (Signed)
Office: [phone]  /  Fax: [phone]   HPI:   Chief Complaint: OBESITY [name] is here to discuss her progress with her obesity treatment plan. She is on the Category 3 plan and is following her eating plan approximately 95 % of the time. She states she is doing cardio exercise and weight training 60 minutes 3 times per week. [name] reports that she is working out consistently. She is slightly hungrier than normal most days. Her weight is 196 lb (88.9 kg) today and has had a weight loss of 6 pounds over a period of 3 weeks since her last visit. She has lost 24 lbs since starting treatment with [name].  Diabetes II [name] has a diagnosis of diabetes type II. Shuntell states fasting BGs range between 106 and 134 and she denies nausea, vomiting, diarrhea or hypoglycemia. Last A1c was at 5.7 (01/05/19). She has been working on intensive lifestyle modifications including diet, exercise, and weight loss to help control her blood glucose levels.  Dyspnea with Exercise [name] notes increasing shortness of breath on exertion. [name] denies dizziness. Indirect Calorimetry will be ordered today.  ASSESSMENT AND PLAN:  Type 2 diabetes mellitus without complication, without long-term current use of insulin (HCC)  Shortness of breath on exertion  Class 1 obesity with serious comorbidity and body mass index (BMI) of 31.0 to 31.9 in adult, unspecified obesity type  PLAN:  Diabetes II [name] has been given extensive diabetes education by myself today including ideal fasting and post-prandial blood glucose readings, individual ideal Hgb A1c goals and hypoglycemia prevention. We discussed the importance of good blood sugar control to decrease the likelihood of diabetic complications such as nephropathy, neuropathy, limb loss, blindness, coronary artery disease, and death. We discussed the importance of intensive lifestyle modification including diet, exercise and weight loss as  the first line treatment for diabetes. [name] agrees to continue with medications and weight loss. [name] agrees to follow up at the agreed upon time.  Dyspnea with exercise [name]'s indirect calorimeter results showed VO2 of 256 and a REE of 1782. She will continue to work on weight loss and exercise to treat her exercise induced shortness of breath.   Obesity [name] is currently in the action stage of change. As such, her goal is to continue with weight loss efforts She has agreed to follow the Category 3 plan [name] has been instructed to work up to a goal of 150 minutes of combined cardio and strengthening exercise per week for weight loss and overall health benefits. We discussed the following Behavioral Modification Strategies today: keeping healthy foods in the home and work on meal planning and easy cooking plans  [name] has agreed to follow up with our clinic in 2 weeks. She was informed of the importance of frequent follow up visits to maximize her success with intensive lifestyle modifications for her multiple health conditions.  ALLERGIES: Allergies  Allergen Reactions   Augmentin [Amoxicillin-Pot Clavulanate] Rash    rash   Erythromycin Nausea Only   Penicillins Itching and Rash    Has patient had a PCN reaction causing immediate rash, facial/tongue/throat swelling, SOB or lightheadedness with hypotension: No Has patient had a PCN reaction causing severe rash involving mucus membranes or skin necrosis: No Has patient had a PCN reaction that required hospitalization: No Has patient had a PCN reaction occurring within the last 10 years: No  If all of the above answers are "NO", then may proceed with Cephalosporin use.     MEDICATIONS: Current Outpatient  Medications on File Prior to Visit  Medication Sig Dispense Refill   aspirin EC 81 MG tablet Take 1 tablet (81 mg total) by mouth daily. 90 tablet 3   atorvastatin (LIPITOR) 40 MG tablet TAKE 1  TABLET (40 MG TOTAL) BY MOUTH DAILY AT 6 PM. 90 tablet 3   Biotin 5000 MCG TABS Take 5,000 mcg by mouth daily.      calcium-vitamin D (OSCAL WITH D) 500-200 MG-UNIT per tablet Take 2 tablets by mouth daily.      Cholecalciferol (VITAMIN D) 125 MCG (5000 UT) CAPS Take 5,000 Units by mouth daily.     clindamycin (CLEOCIN) 300 MG capsule TAKE 2 CAPSULES BY MOUTH 1 HOUR PRIOR TOO DENTAL CLEANING. 2 capsule 0   clopidogrel (PLAVIX) 75 MG tablet TAKE 1 TABLET BY MOUTH EVERY DAY 90 tablet 3   demeclocycline (DECLOMYCIN) 300 MG tablet Take 1 tablet (300 mg total) by mouth daily. 30 tablet 11   fluticasone (FLONASE) 50 MCG/ACT nasal spray Place 2 sprays into both nostrils daily.      ipratropium (ATROVENT) 0.03 % nasal spray Place 2 sprays into both nostrils every 12 (twelve) hours.     Lancets (ONETOUCH ULTRASOFT) lancets Use to test blood sugars daily. Dx: E11.9 100 each 12   loratadine (CLARITIN) 10 MG tablet Take 10 mg by mouth at bedtime.      Melatonin 5 MG TABS Take by mouth.     metFORMIN (GLUCOPHAGE) 500 MG tablet Take 500 mg by mouth 2 (two) times daily with a meal. 1tab at breakfast and1/2 at dinner     metoCLOPramide (REGLAN) 10 MG tablet TAKE 1/2 TABLET BY MOUTH 4 TIMES A DAY 60 tablet 5   metoprolol tartrate (LOPRESSOR) 25 MG tablet Take 0.5 tablets (12.5 mg total) by mouth 2 (two) times daily. 45 tablet 3   Multiple Vitamin (MULTIVITAMIN WITH MINERALS) TABS tablet Take 1 tablet by mouth daily.     naproxen sodium (ALEVE) 220 MG tablet Take 220 mg by mouth.     omeprazole (PRILOSEC OTC) 20 MG tablet Take 40 mg by mouth daily with breakfast.     ONETOUCH VERIO test strip USE TO TEST BLOOD SUGARS DAILY. 100 strip 4   Probiotic Product (ALIGN) 4 MG CAPS Take 4 mg by mouth daily.      psyllium (METAMUCIL SMOOTH TEXTURE) 28 % packet Take 1 packet by mouth daily before breakfast.      venlafaxine XR (EFFEXOR-XR) 75 MG 24 hr capsule Take 1 capsule (75 mg total) by mouth daily  with breakfast. 90 capsule 2   No current facility-administered medications on file prior to visit.     PAST MEDICAL HISTORY: Past Medical History:  Diagnosis Date   Allergy    Anxiety    Arthritis    back & knee   Asthma     mild per pt shows up with resp illness   Chronic diastolic congestive heart failure (HCC)    Colon polyps    Constipation    Diabetes mellitus without complication (HCC)    Dyspnea    Gastric polyps    Gastroparesis    GERD (gastroesophageal reflux disease)    Headache    sinus headaches and migraines at times   Heart murmur    History of migraine headaches    HTN (hypertension)    Hyperlipidemia    IBS (irritable bowel syndrome)    Joint pain    Lumbar disc disease    S/P aortic valve  replacement with bioprosthetic valve 08/23/2016   25 mm Edwards Intuity rapid-deployment bovine pericardial tissue valve via partial upper mini sternotomy   Sleep apnea    TIA (transient ischemic attack)     PAST SURGICAL HISTORY: Past Surgical History:  Procedure Laterality Date   ABDOMINAL HYSTERECTOMY     AORTIC VALVE REPLACEMENT N/A 08/23/2016   Procedure: AORTIC VALVE REPLACEMENT (AVR) - using partial Upper Sternotomy- 39mm Edwards Intuity Aortic Valve used;  Surgeon: Rexene Alberts, MD;  Location: [facility];  Service: Open Heart Surgery;  Laterality: N/A;   BLADDER SUSPENSION  2007   BREAST ENHANCEMENT SURGERY  1980   CARPAL TUNNEL RELEASE Bilateral    COLONOSCOPY     DORSAL COMPARTMENT RELEASE Right 09/15/2014   Procedure: RIGHT WRIST DEQUERVAINS RELEASE ;  Surgeon: Kathryne Hitch, MD;  Location: [facility];  Service: Orthopedics;  Laterality: Right;   ESOPHAGOGASTRODUODENOSCOPY     KNEE ARTHROSCOPY  04/15/2012   Procedure: ARTHROSCOPY KNEE;  Surgeon: [name], MD;  Location: [place];  Service: Orthopedics;  Laterality: Right;   LAPAROSCOPIC CHOLECYSTECTOMY     LASIK     OOPHORECTOMY     PALATE TO GINGIVA  GRAFT  2017   RIGHT/LEFT HEART CATH AND CORONARY ANGIOGRAPHY N/A 08/02/2016   Procedure: Right/Left Heart Cath and Coronary Angiography;  Surgeon: Sherren Mocha, MD;  Location: [facility];  Service: Cardiovascular;  Laterality: N/A;   ROTATOR CUFF REPAIR Left    TEE WITHOUT CARDIOVERSION N/A 08/23/2016   Procedure: TRANSESOPHAGEAL ECHOCARDIOGRAM (TEE);  Surgeon: Rexene Alberts, MD;  Location: [place];  Service: Open Heart Surgery;  Laterality: N/A;   TOTAL KNEE ARTHROPLASTY  04/15/2012   Procedure: TOTAL KNEE ARTHROPLASTY;  Surgeon: [name], MD;  Location: [facility];  Service: Orthopedics;  Laterality: Right;   TRIGGER FINGER RELEASE Right 04/20/2015   Procedure: RIGHT TRIGGER FINGER RELEASE (TENDON SHEALTH INCISION) ;  Surgeon: [name], MD;  Location: [place];  Service: Orthopedics;  Laterality: Right;    SOCIAL HISTORY: Social History   Tobacco Use   Smoking status: Never Smoker   Smokeless tobacco: Never Used  Substance Use Topics   Alcohol use: No   Drug use: No    FAMILY HISTORY: Family History  Problem Relation Age of Onset   COPD Mother    Colon polyps Mother    Irritable bowel syndrome Mother    Anxiety disorder Mother    Heart disease Father    Alcohol abuse Father    Colon polyps Maternal Aunt    Other Neg Hx        hyponatremia    ROS: Review of Systems  Constitutional: Positive for weight loss.  Respiratory: Positive for shortness of breath (on exertion).   Gastrointestinal: Negative for diarrhea, nausea and vomiting.  Neurological: Negative for dizziness.  Endo/Heme/Allergies:       Negative for hypoglycemia Positive for polyphagia    PHYSICAL EXAM: Blood pressure (!) 117/58, pulse 67, temperature 98.8 F (37.1 C), temperature source Oral, height 5\' 6"  (1.676 m), weight 196 lb (88.9 kg), SpO2 98 %. Body mass index is 31.64 kg/m. Physical Exam Vitals signs reviewed.  Constitutional:      Appearance:  Normal appearance. She is well-developed. She is obese.  Cardiovascular:     Rate and Rhythm: Normal rate.  Pulmonary:     Effort: Pulmonary effort is normal.  Musculoskeletal: Normal range of motion.  Skin:    General: Skin is  warm and dry.  Neurological:     Mental Status: She is alert and oriented to person, place, and time.  Psychiatric:        Mood and Affect: Mood normal.        Behavior: Behavior normal.     RECENT LABS AND TESTS: BMET    Component Value Date/Time   NA 137 02/11/2019 1103   K 5.4 (H) 02/11/2019 1103   CL 99 02/11/2019 1103   CO2 23 02/11/2019 1103   GLUCOSE 124 (H) 02/11/2019 1103   GLUCOSE 86 01/20/2019 1543   BUN 18 02/11/2019 1103   CREATININE 0.69 02/11/2019 1103   CALCIUM 9.3 02/11/2019 1103   GFRNONAA 90 02/11/2019 1103   GFRAA 103 02/11/2019 1103   Lab Results  Component Value Date   HGBA1C 5.7 (A) 01/05/2019   HGBA1C 6.1 06/04/2018   HGBA1C 6.6 (H) 01/19/2018   HGBA1C 6.2 (A) 11/25/2017   HGBA1C 6.4 05/23/2017   Lab Results  Component Value Date   INSULIN 12.1 01/19/2018   CBC    Component Value Date/Time   WBC 9.2 01/05/2019 1028   RBC 4.44 01/05/2019 1028   HGB 11.6 (L) 01/05/2019 1028   HGB 11.7 01/19/2018 0959   HCT 35.7 (L) 01/05/2019 1028   HCT 35.7 01/19/2018 0959   PLT 292.0 01/05/2019 1028   PLT 358 04/24/2017 1231   MCV 80.5 01/05/2019 1028   MCV 81 01/19/2018 0959   MCH 26.5 (L) 01/19/2018 0959   MCH 27.4 08/26/2016 0248   MCHC 32.4 01/05/2019 1028   RDW 15.3 01/05/2019 1028   RDW 14.9 01/19/2018 0959   LYMPHSABS 2.9 01/19/2018 0959   MONOABS 0.6 05/11/2015 0836   EOSABS 0.1 01/19/2018 0959   BASOSABS 0.0 01/19/2018 0959   Iron/TIBC/Ferritin/ %Sat No results found for: IRON, TIBC, FERRITIN, IRONPCTSAT Lipid Panel     Component Value Date/Time   CHOL 122 02/11/2019 1103   TRIG 58 02/11/2019 1103   HDL 55 02/11/2019 1103   CHOLHDL 2 06/04/2018 1012   VLDL 14.0 06/04/2018 1012   LDLCALC 55 02/11/2019  1103   LDLDIRECT 58.0 11/25/2017 1156   Hepatic Function Panel     Component Value Date/Time   PROT 6.7 02/11/2019 1103   ALBUMIN 4.6 02/11/2019 1103   AST 26 02/11/2019 1103   ALT 16 02/11/2019 1103   ALKPHOS 151 (H) 02/11/2019 1103   BILITOT 0.3 02/11/2019 1103   BILIDIR 0.1 05/11/2015 0836      Component Value Date/Time   TSH 3.38 06/04/2018 1012   TSH 5.300 (H) 01/19/2018 0959   TSH 3.390 04/24/2017 1231     Ref. Range 01/05/2019 10:28  VITD Latest Ref Range: 30.00 - 100.00 ng/mL 56.90    OBESITY BEHAVIORAL INTERVENTION VISIT  Today's visit was # 27  Starting weight: 220 lbs Starting date: 01/19/2018 Today's weight : 196 lbs Today's date: 04/01/2019 Total lbs lost to date: 24    04/01/2019  Height 5\' 6"  (1.676 m)  Weight 196 lb (88.9 kg)  BMI (Calculated) 31.65  BLOOD PRESSURE - SYSTOLIC [id number]  BLOOD PRESSURE - DIASTOLIC 58   Body Fat % 123XX123 %  Total Body Water (lbs) 74.2 lbs  RMR 1782    ASK: We discussed the diagnosis of obesity with [name] today and [name] agreed to give [name] permission to discuss obesity behavioral modification therapy today.  ASSESS: [name] has the diagnosis of obesity and her BMI today is 31.65 [name] is in the action stage  of change   ADVISE: [name] was educated on the multiple health risks of obesity as well as the benefit of weight loss to improve her health. She was advised of the need for long term treatment and the importance of lifestyle modifications to improve her current health and to decrease her risk of future health problems.  AGREE: Multiple dietary modification options and treatment options were discussed and  [name] agreed to follow the recommendations documented in the above note.  ARRANGE: [name] was educated on the importance of frequent visits to treat obesity as outlined per CMS and USPSTF guidelines and agreed to schedule her next follow up appointment today.  Corey Skains,  am acting as transcriptionist for [name], PA-C I, [name], PA-C have reviewed above note and agree with its content

## 2019-04-20 ENCOUNTER — Emergency Department (HOSPITAL_COMMUNITY)
Admission: EM | Admit: 2019-04-20 | Discharge: 2019-04-21 | Disposition: A | Discharge status: Home / Self Care | Payer: Medicare Other | Attending: Emergency Medicine | Admitting: Emergency Medicine

## 2019-04-20 ENCOUNTER — Encounter (INDEPENDENT_AMBULATORY_CARE_PROVIDER_SITE_OTHER): Payer: Self-pay | Admitting: Physician Assistant

## 2019-04-20 ENCOUNTER — Ambulatory Visit (INDEPENDENT_AMBULATORY_CARE_PROVIDER_SITE_OTHER): Payer: Medicare Other | Admitting: Physician Assistant

## 2019-04-20 ENCOUNTER — Encounter (HOSPITAL_COMMUNITY): Payer: Self-pay | Admitting: Emergency Medicine

## 2019-04-20 ENCOUNTER — Other Ambulatory Visit: Payer: Self-pay

## 2019-04-20 VITALS — BP 112/65 | HR 52 | Temp 99.3°F | Ht 66.0 in | Wt 197.0 lb

## 2019-04-20 DIAGNOSIS — I110 Hypertensive heart disease with heart failure: Secondary | ICD-10-CM | POA: Diagnosis not present

## 2019-04-20 DIAGNOSIS — Z952 Presence of prosthetic heart valve: Secondary | ICD-10-CM | POA: Insufficient documentation

## 2019-04-20 DIAGNOSIS — N390 Urinary tract infection, site not specified: Secondary | ICD-10-CM | POA: Diagnosis not present

## 2019-04-20 DIAGNOSIS — E119 Type 2 diabetes mellitus without complications: Secondary | ICD-10-CM | POA: Diagnosis not present

## 2019-04-20 DIAGNOSIS — E669 Obesity, unspecified: Secondary | ICD-10-CM

## 2019-04-20 DIAGNOSIS — J45909 Unspecified asthma, uncomplicated: Secondary | ICD-10-CM | POA: Insufficient documentation

## 2019-04-20 DIAGNOSIS — Z79899 Other long term (current) drug therapy: Secondary | ICD-10-CM | POA: Insufficient documentation

## 2019-04-20 DIAGNOSIS — Z880 Allergy status to penicillin: Secondary | ICD-10-CM | POA: Insufficient documentation

## 2019-04-20 DIAGNOSIS — Z881 Allergy status to other antibiotic agents status: Secondary | ICD-10-CM | POA: Insufficient documentation

## 2019-04-20 DIAGNOSIS — Z6831 Body mass index (BMI) 31.0-31.9, adult: Secondary | ICD-10-CM | POA: Diagnosis not present

## 2019-04-20 DIAGNOSIS — E782 Mixed hyperlipidemia: Secondary | ICD-10-CM | POA: Insufficient documentation

## 2019-04-20 DIAGNOSIS — E1169 Type 2 diabetes mellitus with other specified complication: Secondary | ICD-10-CM | POA: Diagnosis not present

## 2019-04-20 DIAGNOSIS — I5032 Chronic diastolic (congestive) heart failure: Secondary | ICD-10-CM | POA: Insufficient documentation

## 2019-04-20 DIAGNOSIS — R300 Dysuria: Secondary | ICD-10-CM | POA: Diagnosis present

## 2019-04-20 LAB — URINALYSIS, ROUTINE W REFLEX MICROSCOPIC
Bilirubin Urine: NEGATIVE
Glucose, UA: NEGATIVE mg/dL
Ketones, ur: NEGATIVE mg/dL
Nitrite: NEGATIVE
Protein, ur: NEGATIVE mg/dL
Specific Gravity, Urine: 1.003 — ABNORMAL LOW (ref 1.005–1.030)
pH: 6 (ref 5.0–8.0)

## 2019-04-20 NOTE — ED Triage Notes (Signed)
Patient here from home with complaints of painful urination and urinary frequency that started today. Reports hx of UTI.

## 2019-04-21 ENCOUNTER — Encounter (HOSPITAL_BASED_OUTPATIENT_CLINIC_OR_DEPARTMENT_OTHER): Payer: Self-pay | Admitting: Emergency Medicine

## 2019-04-21 ENCOUNTER — Emergency Department (HOSPITAL_BASED_OUTPATIENT_CLINIC_OR_DEPARTMENT_OTHER)
Admission: EM | Admit: 2019-04-21 | Discharge: 2019-04-21 | Disposition: A | Discharge status: Home / Self Care | Payer: Medicare Other | Source: Home / Self Care | Attending: Emergency Medicine | Admitting: Emergency Medicine

## 2019-04-21 ENCOUNTER — Other Ambulatory Visit: Payer: Self-pay

## 2019-04-21 DIAGNOSIS — I5032 Chronic diastolic (congestive) heart failure: Secondary | ICD-10-CM | POA: Insufficient documentation

## 2019-04-21 DIAGNOSIS — I110 Hypertensive heart disease with heart failure: Secondary | ICD-10-CM | POA: Insufficient documentation

## 2019-04-21 DIAGNOSIS — J45909 Unspecified asthma, uncomplicated: Secondary | ICD-10-CM | POA: Insufficient documentation

## 2019-04-21 DIAGNOSIS — N390 Urinary tract infection, site not specified: Secondary | ICD-10-CM | POA: Insufficient documentation

## 2019-04-21 DIAGNOSIS — E119 Type 2 diabetes mellitus without complications: Secondary | ICD-10-CM | POA: Insufficient documentation

## 2019-04-21 LAB — URINALYSIS, MICROSCOPIC (REFLEX)
RBC / HPF: >50 RBC/hpf (ref 0–5)
WBC, UA: >50 WBC/hpf (ref 0–5)

## 2019-04-21 LAB — URINALYSIS, ROUTINE W REFLEX MICROSCOPIC

## 2019-04-21 MED ORDER — PHENAZOPYRIDINE HCL 200 MG PO TABS: 200.0000 mg | ORAL_TABLET | Freq: Three times a day (TID) | ORAL | 0 refills | Status: DC | PRN

## 2019-04-21 MED ORDER — CEPHALEXIN 250 MG PO CAPS
1000.0000 mg | ORAL_CAPSULE | Freq: Once | ORAL | Status: AC
  Administered 2019-04-21: 1000 mg via ORAL
  Filled 2019-04-21: qty 4

## 2019-04-21 MED ORDER — PHENAZOPYRIDINE HCL 100 MG PO TABS
95.0000 mg | ORAL_TABLET | Freq: Once | ORAL | Status: AC
  Administered 2019-04-21: 100 mg via ORAL
  Filled 2019-04-21: qty 1

## 2019-04-21 MED ORDER — ACETAMINOPHEN 500 MG PO TABS
1000.0000 mg | ORAL_TABLET | Freq: Once | ORAL | Status: AC
  Administered 2019-04-21: 1000 mg via ORAL
  Filled 2019-04-21: qty 2

## 2019-04-21 MED ORDER — CEPHALEXIN 500 MG PO CAPS: 500.0000 mg | ORAL_CAPSULE | Freq: Four times a day (QID) | ORAL | 0 refills | Status: DC

## 2019-04-21 NOTE — ED Provider Notes (Signed)
Emergency Department Provider Note   I have reviewed the triage vital signs and the nursing notes.   HISTORY  Chief Complaint Dysuria   HPI [name] is a 68 y.o. female who presents the emergency department today with urinary symptoms.  Patient states around 1800 she started having suprapubic pain and subsequently has dysuria as well.  She went to [facility] where her urine test showed that she had UTI but never seen by provider she left without being seen again here for further evaluation. no back pain, vomiting, fever or systemic symptoms.   No other associated or modifying symptoms.    Past Medical History:  Diagnosis Date  . Allergy   . Anxiety   . Arthritis    back & knee  . Asthma     mild per pt shows up with resp illness  . Chronic diastolic congestive heart failure ([place])   . Colon polyps   . Constipation   . Diabetes mellitus without complication ([place])   . Dyspnea   . Gastric polyps   . Gastroparesis   . GERD (gastroesophageal reflux disease)   . Headache    sinus headaches and migraines at times  . Heart murmur   . History of migraine headaches   . HTN (hypertension)   . Hyperlipidemia   . IBS (irritable bowel syndrome)   . Joint pain   . Lumbar disc disease   . S/P aortic valve replacement with bioprosthetic valve 08/23/2016   25 mm Edwards Intuity rapid-deployment bovine pericardial tissue valve via partial upper mini sternotomy  . Sleep apnea   . TIA (transient ischemic attack)     Patient Active Problem List   Diagnosis Date Noted  . Vitamin D deficiency 06/04/2018  . Hyponatremia 02/17/2018  . History of CVA in adulthood 08/13/2017  . Blurry vision 08/13/2017  . Hypertension associated with diabetes ([place]) 01/02/2017  . S/P minimally invasive aortic valve replacement with bioprosthetic valve 08/23/2016  . Diastolic dysfunction   . Osteopenia 05/27/2016  . OSA (obstructive sleep apnea) 02/27/2016  . Bruxism 10/23/2015  . Elevated  alkaline phosphatase level 05/16/2015  . Atypical chest pain 11/10/2014  . Acne 05/12/2014  . Diabetes mellitus type 2, controlled ([place]) 05/12/2014  . GERD (gastroesophageal reflux disease) 03/10/2014  . Generalized anxiety disorder 03/10/2014  . Migraines 03/10/2014  . EUSTACHIAN TUBE DYSFUNCTION, CHRONIC 06/14/2010  . Irritable bowel syndrome 12/15/2008  . OBESITY, MORBID 10/12/2008  . Low back pain 08/11/2008  . Hyperlipidemia associated with type 2 diabetes mellitus ([place]) 02/09/2008  . Allergic rhinitis 02/12/2007  . ASTHMA 02/12/2007    Past Surgical History:  Procedure Laterality Date  . ABDOMINAL HYSTERECTOMY    . AORTIC VALVE REPLACEMENT N/A 08/23/2016   Procedure: AORTIC VALVE REPLACEMENT (AVR) - using partial Upper Sternotomy- 75mm Edwards Intuity Aortic Valve used;  Surgeon: Rexene Alberts, MD;  Location: [place];  Service: Open Heart Surgery;  Laterality: N/A;  . BLADDER SUSPENSION  2007  . BREAST ENHANCEMENT SURGERY  1980  . CARPAL TUNNEL RELEASE Bilateral   . COLONOSCOPY    . DORSAL COMPARTMENT RELEASE Right 09/15/2014   Procedure: RIGHT WRIST DEQUERVAINS RELEASE ;  Surgeon: Kathryne Hitch, MD;  Location: [place];  Service: Orthopedics;  Laterality: Right;  . ESOPHAGOGASTRODUODENOSCOPY    . KNEE ARTHROSCOPY  04/15/2012   Procedure: ARTHROSCOPY KNEE;  Surgeon: [name], MD;  Location: [place];  Service: Orthopedics;  Laterality: Right;  . LAPAROSCOPIC CHOLECYSTECTOMY    .  LASIK    . OOPHORECTOMY    . PALATE TO GINGIVA GRAFT  2017  . RIGHT/LEFT HEART CATH AND CORONARY ANGIOGRAPHY N/A 08/02/2016   Procedure: Right/Left Heart Cath and Coronary Angiography;  Surgeon: Sherren Mocha, MD;  Location: [place] [facility];  Service: Cardiovascular;  Laterality: N/A;  . ROTATOR CUFF REPAIR Left   . TEE WITHOUT CARDIOVERSION N/A 08/23/2016   Procedure: TRANSESOPHAGEAL ECHOCARDIOGRAM (TEE);  Surgeon: Rexene Alberts, MD;  Location: [place];  Service: Open Heart  Surgery;  Laterality: N/A;  . TOTAL KNEE ARTHROPLASTY  04/15/2012   Procedure: TOTAL KNEE ARTHROPLASTY;  Surgeon: [name], MD;  Location: [place];  Service: Orthopedics;  Laterality: Right;  . TRIGGER FINGER RELEASE Right 04/20/2015   Procedure: RIGHT TRIGGER FINGER RELEASE (TENDON SHEALTH INCISION) ;  Surgeon: [name], MD;  Location: [place] [facility];  Service: Orthopedics;  Laterality: Right;    Current Outpatient Rx  . Order #: [id number] Class: Historical Med  . Order #: [id number] Class: Historical Med  . Order #: [id number] Class: Historical Med  . Order #: [id number] Class: No Print  . Order #: [id number] Class: Normal  . Order #: [id number] Class: Historical Med  . Order #: [id number] Class: Historical Med  . Order #: [id number] Class: Normal  . Order #: [id number] Class: Historical Med  . Order #: [id number] Class: Normal  . Order #: [id number] Class: Normal  . Order #: [id number] Class: Normal  . Order #: [id number] Class: Historical Med  . Order #: [id number] Class: Historical Med  . Order #: [id number] Class: Normal  . Order #: [id number] Class: Historical Med  . Order #: [id number] Class: Historical Med  . Order #: [id number] Class: Historical Med  . Order #: [id number] Class: Normal  . Order #: [id number] Class: Normal  . Order #: [id number] Class: Historical Med  . Order #: [id number] Class: Historical Med  . Order #: [id number] Class: Historical Med  . Order #: [id number] Class: Normal  . Order #: [id number] Class: Normal  . Order #: [id number] Class: Historical Med  . Order #: [id number] Class: Historical Med  . Order #: [id number] Class: Normal    Allergies Augmentin [amoxicillin-pot clavulanate], Erythromycin, and Penicillins  Family History  Problem Relation Age of Onset  . COPD Mother   . Colon polyps Mother   . Irritable bowel syndrome Mother   . Anxiety disorder Mother   . Heart disease Father   . Alcohol abuse Father   . Colon polyps Maternal Aunt   . Other Neg Hx         hyponatremia    Social History Social History   Tobacco Use  . Smoking status: Never Smoker  . Smokeless tobacco: Never Used  Substance Use Topics  . Alcohol use: No  . Drug use: No    Review of Systems  All other systems negative except as documented in the HPI. All pertinent positives and negatives as reviewed in the HPI. ____________________________________________   PHYSICAL EXAM:  VITAL SIGNS: ED Triage Vitals  Enc Vitals Group     BP 04/21/19 0237 (!) 225/68     Pulse Rate 04/21/19 0236 88     Resp 04/21/19 0236 20     Temp 04/21/19 0236 99 F (37.2 C)     Temp Source 04/21/19 0236 Oral     SpO2 04/21/19 0236 99 %     Weight 04/21/19 0233 197 lb (89.4 kg)     Height 04/21/19 0233 5\' 6"  (1.676 m)     Head Circumference --      Peak Flow --      Pain  Score 04/21/19 0232 10     Pain Loc --      Pain Edu? --      Excl. in [place]? --     Constitutional: Alert and oriented. Well appearing and in no acute distress. Eyes: Conjunctivae are normal. PERRL. EOMI. Head: Atraumatic. Nose: No congestion/rhinnorhea. Mouth/Throat: Mucous membranes are moist.  Oropharynx non-erythematous. Neck: No stridor.  No meningeal signs.   Cardiovascular: Normal rate, regular rhythm. Good peripheral circulation. Grossly normal heart sounds.   Respiratory: Normal respiratory effort.  No retractions. Lungs CTAB. Gastrointestinal: Soft and nontender. No distention.  Musculoskeletal: No lower extremity tenderness nor edema. No gross deformities of extremities. Neurologic:  Normal speech and language. No gross focal neurologic deficits are appreciated.  Skin:  Skin is warm, dry and intact. No rash noted.   ____________________________________________   LABS (all labs ordered are listed, but only abnormal results are displayed)  Labs Reviewed  URINALYSIS, ROUTINE W REFLEX MICROSCOPIC - Abnormal; Notable for the following components:      Result Value   Color, Urine RED (*)     APPearance TURBID (*)    Glucose, UA   (*)    Value: TEST NOT REPORTED DUE TO COLOR INTERFERENCE OF URINE PIGMENT   Hgb urine dipstick   (*)    Value: TEST NOT REPORTED DUE TO COLOR INTERFERENCE OF URINE PIGMENT   Bilirubin Urine   (*)    Value: TEST NOT REPORTED DUE TO COLOR INTERFERENCE OF URINE PIGMENT   Ketones, ur   (*)    Value: TEST NOT REPORTED DUE TO COLOR INTERFERENCE OF URINE PIGMENT   Protein, ur   (*)    Value: TEST NOT REPORTED DUE TO COLOR INTERFERENCE OF URINE PIGMENT   Nitrite   (*)    Value: TEST NOT REPORTED DUE TO COLOR INTERFERENCE OF URINE PIGMENT   Leukocytes,Ua   (*)    Value: TEST NOT REPORTED DUE TO COLOR INTERFERENCE OF URINE PIGMENT   All other components within normal limits  URINALYSIS, MICROSCOPIC (REFLEX) - Abnormal; Notable for the following components:   Bacteria, UA MANY (*)    Non Squamous Epithelial PRESENT (*)    All other components within normal limits   ____________________________________________  INITIAL IMPRESSION / ASSESSMENT AND PLAN / ED COURSE  Uncomplicated UTI. Abx/symptomatic treatment at home.      Pertinent labs & imaging results that were available during my care of the patient were reviewed by me and considered in my medical decision making (see chart for details).  A medical screening exam was performed and I feel the patient has had an appropriate workup for their chief complaint at this time and likelihood of emergent condition existing is low. They have been counseled on decision, discharge, follow up and which symptoms necessitate immediate return to the emergency department. They or their family verbally stated understanding and agreement with plan and discharged in stable condition.   ____________________________________________  FINAL CLINICAL IMPRESSION(S) / ED DIAGNOSES  Final diagnoses:  Lower urinary tract infectious disease     MEDICATIONS GIVEN DURING THIS VISIT:  Medications  cephALEXin (KEFLEX) capsule  1,000 mg (1,000 mg Oral Given 04/21/19 0255)  phenazopyridine (PYRIDIUM) tablet 100 mg (100 mg Oral Given 04/21/19 0255)  acetaminophen (TYLENOL) tablet 1,000 mg (1,000 mg Oral Given 04/21/19 0255)     NEW OUTPATIENT MEDICATIONS STARTED DURING THIS VISIT:  Discharge Medication List as of 04/21/2019  3:05 AM    START taking these medications   Details  cephALEXin (KEFLEX) 500 MG capsule Take 1 capsule (500 mg total) by mouth 4 (four) times daily., Starting Wed 04/21/2019, Normal    phenazopyridine (PYRIDIUM) 200 MG tablet Take 1 tablet (200 mg total) by mouth 3 (three) times daily as needed for pain., Starting Wed 04/21/2019, Print        Note:  This note was prepared with assistance of Dragon voice recognition software. Occasional wrong-word or sound-a-like substitutions may have occurred due to the inherent limitations of voice recognition software.   Paulina Muchmore, Corene Cornea, MD 04/21/19 [phone]

## 2019-04-21 NOTE — ED Triage Notes (Signed)
Came from [facility] without being seen. C/o UTI sx for 1 days.

## 2019-04-21 NOTE — Progress Notes (Signed)
Office: [phone]  /  Fax: [phone]   HPI:   Chief Complaint: OBESITY [name] is here to discuss her progress with her obesity treatment plan. She is on the Category 3 plan and is following her eating plan approximately 95 % of the time. She states she is doing cardio exercise and weight machine 60 minutes 3 times per week. [name] reports that her hunger is controlled. She is in a good routine and she is getting in all of her protein daily. Her weight is 197 lb (89.4 kg) today and has had a weight gain of 1 pound over a period of 3 weeks since her last visit. She has lost 23 lbs since starting treatment with [name].  Diabetes II [name] has a diagnosis of diabetes type II. [name] states fasting BGs range between 108 and 131 and she denies nausea, vomiting, diarrhea or polyphagia. She has been working on intensive lifestyle modifications including diet, exercise, and weight loss to help control her blood glucose levels.  ASSESSMENT AND PLAN:  Type 2 diabetes mellitus without complication, without long-term current use of insulin (HCC)  Class 1 obesity with serious comorbidity and body mass index (BMI) of 31.0 to 31.9 in adult, unspecified obesity type  PLAN:  Diabetes II [name] has been given extensive diabetes education by myself today including ideal fasting and post-prandial blood glucose readings, individual ideal Hgb A1c goals and hypoglycemia prevention. We discussed the importance of good blood sugar control to decrease the likelihood of diabetic complications such as nephropathy, neuropathy, limb loss, blindness, coronary artery disease, and death. We discussed the importance of intensive lifestyle modification including diet, exercise and weight loss as the first line treatment for diabetes. [name] will continue with medications and weight loss and she will follow up at the agreed upon time.  Obesity [name] is currently in the action stage of  change. As such, her goal is to continue with weight loss efforts She has agreed to follow the Category 3 plan modified for 200 snack calories on days she does not work out [name] has been instructed to work up to a goal of 150 minutes of combined cardio and strengthening exercise per week for weight loss and overall health benefits. We discussed the following Behavioral Modification Strategies today: keeping healthy foods in the home and work on meal planning and easy cooking plans  [name] has agreed to follow up with our clinic in 2 to 3 weeks. She was informed of the importance of frequent follow up visits to maximize her success with intensive lifestyle modifications for her multiple health conditions.  ALLERGIES: Allergies  Allergen Reactions   Augmentin [Amoxicillin-Pot Clavulanate] Rash    rash   Erythromycin Nausea Only   Penicillins Itching and Rash    Has patient had a PCN reaction causing immediate rash, facial/tongue/throat swelling, SOB or lightheadedness with hypotension: No Has patient had a PCN reaction causing severe rash involving mucus membranes or skin necrosis: No Has patient had a PCN reaction that required hospitalization: No Has patient had a PCN reaction occurring within the last 10 years: No  If all of the above answers are "NO", then may proceed with Cephalosporin use.     MEDICATIONS: Current Outpatient Medications on File Prior to Visit  Medication Sig Dispense Refill   aspirin EC 81 MG tablet Take 1 tablet (81 mg total) by mouth daily. 90 tablet 3   atorvastatin (LIPITOR) 40 MG tablet TAKE 1 TABLET (40 MG TOTAL) BY MOUTH DAILY AT 6 PM.  90 tablet 3   Biotin 5000 MCG TABS Take 5,000 mcg by mouth daily.      calcium-vitamin D (OSCAL WITH D) 500-200 MG-UNIT per tablet Take 2 tablets by mouth daily.      Cholecalciferol (VITAMIN D) 125 MCG (5000 UT) CAPS Take 5,000 Units by mouth daily.     clindamycin (CLEOCIN) 300 MG capsule TAKE 2 CAPSULES  BY MOUTH 1 HOUR PRIOR TOO DENTAL CLEANING. 2 capsule 0   clopidogrel (PLAVIX) 75 MG tablet TAKE 1 TABLET BY MOUTH EVERY DAY 90 tablet 3   demeclocycline (DECLOMYCIN) 300 MG tablet Take 1 tablet (300 mg total) by mouth daily. 30 tablet 11   fluticasone (FLONASE) 50 MCG/ACT nasal spray Place 2 sprays into both nostrils daily.      ipratropium (ATROVENT) 0.03 % nasal spray Place 2 sprays into both nostrils every 12 (twelve) hours.     Lancets (ONETOUCH ULTRASOFT) lancets Use to test blood sugars daily. Dx: E11.9 100 each 12   loratadine (CLARITIN) 10 MG tablet Take 10 mg by mouth at bedtime.      Melatonin 5 MG TABS Take by mouth.     metFORMIN (GLUCOPHAGE) 500 MG tablet Take 500 mg by mouth 2 (two) times daily with a meal. 1tab at breakfast and1/2 at dinner     metoCLOPramide (REGLAN) 10 MG tablet TAKE 1/2 TABLET BY MOUTH 4 TIMES A DAY 60 tablet 5   metoprolol tartrate (LOPRESSOR) 25 MG tablet Take 0.5 tablets (12.5 mg total) by mouth 2 (two) times daily. 45 tablet 3   Multiple Vitamin (MULTIVITAMIN WITH MINERALS) TABS tablet Take 1 tablet by mouth daily.     naproxen sodium (ALEVE) 220 MG tablet Take 220 mg by mouth.     omeprazole (PRILOSEC OTC) 20 MG tablet Take 40 mg by mouth daily with breakfast.     ONETOUCH VERIO test strip USE TO TEST BLOOD SUGARS DAILY. 100 strip 4   Probiotic Product (ALIGN) 4 MG CAPS Take 4 mg by mouth daily.      psyllium (METAMUCIL SMOOTH TEXTURE) 28 % packet Take 1 packet by mouth daily before breakfast.      venlafaxine XR (EFFEXOR-XR) 75 MG 24 hr capsule Take 1 capsule (75 mg total) by mouth daily with breakfast. 90 capsule 2   No current facility-administered medications on file prior to visit.     PAST MEDICAL HISTORY: Past Medical History:  Diagnosis Date   Allergy    Anxiety    Arthritis    back & knee   Asthma     mild per pt shows up with resp illness   Chronic diastolic congestive heart failure (HCC)    Colon polyps     Constipation    Diabetes mellitus without complication (HCC)    Dyspnea    Gastric polyps    Gastroparesis    GERD (gastroesophageal reflux disease)    Headache    sinus headaches and migraines at times   Heart murmur    History of migraine headaches    HTN (hypertension)    Hyperlipidemia    IBS (irritable bowel syndrome)    Joint pain    Lumbar disc disease    S/P aortic valve replacement with bioprosthetic valve 08/23/2016   25 mm Edwards Intuity rapid-deployment bovine pericardial tissue valve via partial upper mini sternotomy   Sleep apnea    TIA (transient ischemic attack)     PAST SURGICAL HISTORY: Past Surgical History:  Procedure Laterality Date   ABDOMINAL  HYSTERECTOMY     AORTIC VALVE REPLACEMENT N/A 08/23/2016   Procedure: AORTIC VALVE REPLACEMENT (AVR) - using partial Upper Sternotomy- 51mm Edwards Intuity Aortic Valve used;  Surgeon: Rexene Alberts, MD;  Location: [place];  Service: Open Heart Surgery;  Laterality: N/A;   BLADDER SUSPENSION  2007   BREAST ENHANCEMENT SURGERY  1980   CARPAL TUNNEL RELEASE Bilateral    COLONOSCOPY     DORSAL COMPARTMENT RELEASE Right 09/15/2014   Procedure: RIGHT WRIST DEQUERVAINS RELEASE ;  Surgeon: Kathryne Hitch, MD;  Location: [place];  Service: Orthopedics;  Laterality: Right;   ESOPHAGOGASTRODUODENOSCOPY     KNEE ARTHROSCOPY  04/15/2012   Procedure: ARTHROSCOPY KNEE;  Surgeon: [name], MD;  Location: [place];  Service: Orthopedics;  Laterality: Right;   LAPAROSCOPIC CHOLECYSTECTOMY     LASIK     OOPHORECTOMY     PALATE TO GINGIVA GRAFT  2017   RIGHT/LEFT HEART CATH AND CORONARY ANGIOGRAPHY N/A 08/02/2016   Procedure: Right/Left Heart Cath and Coronary Angiography;  Surgeon: Sherren Mocha, MD;  Location: [place] [facility];  Service: Cardiovascular;  Laterality: N/A;   ROTATOR CUFF REPAIR Left    TEE WITHOUT CARDIOVERSION N/A 08/23/2016   Procedure: TRANSESOPHAGEAL  ECHOCARDIOGRAM (TEE);  Surgeon: Rexene Alberts, MD;  Location: [place];  Service: Open Heart Surgery;  Laterality: N/A;   TOTAL KNEE ARTHROPLASTY  04/15/2012   Procedure: TOTAL KNEE ARTHROPLASTY;  Surgeon: [name], MD;  Location: [place];  Service: Orthopedics;  Laterality: Right;   TRIGGER FINGER RELEASE Right 04/20/2015   Procedure: RIGHT TRIGGER FINGER RELEASE (TENDON SHEALTH INCISION) ;  Surgeon: [name], MD;  Location: [place];  Service: Orthopedics;  Laterality: Right;    SOCIAL HISTORY: Social History   Tobacco Use   Smoking status: Never Smoker   Smokeless tobacco: Never Used  Substance Use Topics   Alcohol use: No   Drug use: No    FAMILY HISTORY: Family History  Problem Relation Age of Onset   COPD Mother    Colon polyps Mother    Irritable bowel syndrome Mother    Anxiety disorder Mother    Heart disease Father    Alcohol abuse Father    Colon polyps Maternal Aunt    Other Neg Hx        hyponatremia    ROS: Review of Systems  Constitutional: Negative for weight loss.  Gastrointestinal: Negative for diarrhea, nausea and vomiting.  Endo/Heme/Allergies:       Negative for polyphagia    PHYSICAL EXAM: Blood pressure 112/65, pulse (!) 52, temperature 99.3 F (37.4 C), temperature source Oral, height 5\' 6"  (1.676 m), weight 197 lb (89.4 kg), SpO2 97 %. Body mass index is 31.8 kg/m. Physical Exam Vitals signs reviewed.  Constitutional:      Appearance: Normal appearance. She is well-developed. She is obese.  Cardiovascular:     Rate and Rhythm: Normal rate.  Pulmonary:     Effort: Pulmonary effort is normal.  Musculoskeletal: Normal range of motion.  Skin:    General: Skin is warm and dry.  Neurological:     Mental Status: She is alert and oriented to person, place, and time.  Psychiatric:        Mood and Affect: Mood normal.        Behavior: Behavior normal.     RECENT LABS AND TESTS: BMET     Component Value Date/Time   NA 137 02/11/2019 1103  K 5.4 (H) 02/11/2019 1103   CL 99 02/11/2019 1103   CO2 23 02/11/2019 1103   GLUCOSE 124 (H) 02/11/2019 1103   GLUCOSE 86 01/20/2019 1543   BUN 18 02/11/2019 1103   CREATININE 0.69 02/11/2019 1103   CALCIUM 9.3 02/11/2019 1103   GFRNONAA 90 02/11/2019 1103   GFRAA 103 02/11/2019 1103   Lab Results  Component Value Date   HGBA1C 5.7 (A) 01/05/2019   HGBA1C 6.1 06/04/2018   HGBA1C 6.6 (H) 01/19/2018   HGBA1C 6.2 (A) 11/25/2017   HGBA1C 6.4 05/23/2017   Lab Results  Component Value Date   INSULIN 12.1 01/19/2018   CBC    Component Value Date/Time   WBC 9.2 01/05/2019 1028   RBC 4.44 01/05/2019 1028   HGB 11.6 (L) 01/05/2019 1028   HGB 11.7 01/19/2018 0959   HCT 35.7 (L) 01/05/2019 1028   HCT 35.7 01/19/2018 0959   PLT 292.0 01/05/2019 1028   PLT 358 04/24/2017 1231   MCV 80.5 01/05/2019 1028   MCV 81 01/19/2018 0959   MCH 26.5 (L) 01/19/2018 0959   MCH 27.4 08/26/2016 0248   MCHC 32.4 01/05/2019 1028   RDW 15.3 01/05/2019 1028   RDW 14.9 01/19/2018 0959   LYMPHSABS 2.9 01/19/2018 0959   MONOABS 0.6 05/11/2015 0836   EOSABS 0.1 01/19/2018 0959   BASOSABS 0.0 01/19/2018 0959   Iron/TIBC/Ferritin/ %Sat No results found for: IRON, TIBC, FERRITIN, IRONPCTSAT Lipid Panel     Component Value Date/Time   CHOL 122 02/11/2019 1103   TRIG 58 02/11/2019 1103   HDL 55 02/11/2019 1103   CHOLHDL 2 06/04/2018 1012   VLDL 14.0 06/04/2018 1012   LDLCALC 55 02/11/2019 1103   LDLDIRECT 58.0 11/25/2017 1156   Hepatic Function Panel     Component Value Date/Time   PROT 6.7 02/11/2019 1103   ALBUMIN 4.6 02/11/2019 1103   AST 26 02/11/2019 1103   ALT 16 02/11/2019 1103   ALKPHOS 151 (H) 02/11/2019 1103   BILITOT 0.3 02/11/2019 1103   BILIDIR 0.1 05/11/2015 0836      Component Value Date/Time   TSH 3.38 06/04/2018 1012   TSH 5.300 (H) 01/19/2018 0959   TSH 3.390 04/24/2017 1231     Ref. Range 01/05/2019 10:28   VITD Latest Ref Range: 30.00 - 100.00 ng/mL 56.90    OBESITY BEHAVIORAL INTERVENTION VISIT  Today's visit was # 28  Starting weight: 220 lbs Starting date: 01/19/2018 Today's weight :  197 lbs Today's date: 04/20/2019 Total lbs lost to date: 23    04/20/2019 1100  Height 5\' 6"  (1.676 m)  Weight 197 lb (89.4 kg)  BMI (Calculated) 31.81   Body Fat % 46.5 %  Total Body Water (lbs) 73.2 lbs      04/20/2019  BLOOD PRESSURE - SYSTOLIC [id number]  BLOOD PRESSURE - DIASTOLIC 65   ASK: We discussed the diagnosis of obesity with Kristyn P Apodaca today and Antrice agreed to give [name] permission to discuss obesity behavioral modification therapy today.  ASSESS: Lanijah has the diagnosis of obesity and her BMI today is 31.81 Deidra is in the action stage of change   ADVISE: Daania was educated on the multiple health risks of obesity as well as the benefit of weight loss to improve her health. She was advised of the need for long term treatment and the importance of lifestyle modifications to improve her current health and to decrease her risk of future health problems.  AGREE: Multiple dietary modification options and  treatment options were discussed and  Ahtziry agreed to follow the recommendations documented in the above note.  ARRANGE: Daeonna was educated on the importance of frequent visits to treat obesity as outlined per CMS and USPSTF guidelines and agreed to schedule her next follow up appointment today.  Corey Skains, am acting as transcriptionist for [name], PA-C I, [name], PA-C have reviewed above note and agree with its content

## 2019-04-21 NOTE — ED Notes (Signed)
ED Provider at bedside. 

## 2019-04-22 ENCOUNTER — Telehealth: Payer: Self-pay | Admitting: Family Medicine

## 2019-04-22 LAB — URINE CULTURE: Culture: 90000 — AB

## 2019-04-22 NOTE — Telephone Encounter (Signed)
Do you need me to work in some where

## 2019-04-22 NOTE — Telephone Encounter (Signed)
Copied from [place] [phone]. Topic: Appointment Scheduling - Scheduling Inquiry for Clinic >> Apr 22, 2019  4:07 PM Alease Frame wrote: Reason for CRM: Patient was seen at the ER yesterday [id number] and she would like to do a hospital follow up .  Please advise if pt can be worked in

## 2019-04-22 NOTE — Telephone Encounter (Signed)
Can use same-day visit next week

## 2019-04-23 ENCOUNTER — Telehealth: Payer: Self-pay | Admitting: Family Medicine

## 2019-04-23 NOTE — Telephone Encounter (Signed)
Duplicate

## 2019-04-23 NOTE — Telephone Encounter (Signed)
Please advise where patient can be worked in?  Copied from [place] [phone]. Topic: Appointment Scheduling - Scheduling Inquiry for Clinic >> Apr 22, 2019  4:07 PM Alease Frame wrote: Reason for CRM: Patient was seen at the ER yesterday [id number] and she would like to do a hospital follow up . Please advise >> Apr 23, 2019 11:17 AM Sheran Luz wrote: Patient calling back about this request. Unable to schedule same day right now.

## 2019-04-23 NOTE — Telephone Encounter (Signed)
Called patient app made. Reviewed red words for need to go to ed or urgent care.

## 2019-04-23 NOTE — Telephone Encounter (Signed)
Please advise on when patient can be worked into Dr. Ansel Bong schedule for a hospital follow up  Copied from [place] [phone]. Topic: Appointment Scheduling - Scheduling Inquiry for Clinic >> Apr 22, 2019  4:07 PM Alease Frame wrote: Reason for CRM: Patient was seen at the ER yesterday [id number] and she would like to do a hospital follow up . Please advise >> Apr 23, 2019 11:17 AM Sheran Luz wrote: Patient calling back about this request. Unable to schedule same day right now.

## 2019-04-27 ENCOUNTER — Encounter: Payer: Self-pay | Admitting: Family Medicine

## 2019-04-27 ENCOUNTER — Other Ambulatory Visit: Payer: Self-pay

## 2019-04-27 ENCOUNTER — Ambulatory Visit (INDEPENDENT_AMBULATORY_CARE_PROVIDER_SITE_OTHER): Payer: Medicare Other | Admitting: Family Medicine

## 2019-04-27 VITALS — BP 114/60 | HR 61 | Temp 97.3°F | Ht 66.0 in | Wt 203.2 lb

## 2019-04-27 DIAGNOSIS — R300 Dysuria: Secondary | ICD-10-CM

## 2019-04-27 LAB — POC URINALSYSI DIPSTICK (AUTOMATED)
Bilirubin, UA: NEGATIVE
Blood, UA: NEGATIVE
Glucose, UA: NEGATIVE
Ketones, UA: NEGATIVE
Leukocytes, UA: NEGATIVE
Nitrite, UA: NEGATIVE
Protein, UA: NEGATIVE
Spec Grav, UA: 1.01 (ref 1.010–1.025)
Urobilinogen, UA: 0.2 E.U./dL
pH, UA: 6 (ref 5.0–8.0)

## 2019-04-27 NOTE — Addendum Note (Signed)
Addended by: Francis Dowse T on: 04/27/2019 02:21 PM   Modules accepted: Orders

## 2019-04-27 NOTE — Patient Instructions (Addendum)
Health Maintenance Due  Topic Date Due  . MAMMOGRAM - scheduled for November 12th 03/20/2019  . OPHTHALMOLOGY EXAM - we will update this in mychart 04/03/2019   Please stop by lab before you go- starting with just basic urine test and may do microscopic exam depending on findings. Even if there is still blood may simply repeat next month.  If you do not have mychart- we will call you about results within 5 business days of [name] receiving them.  If you have mychart- we will send your results within 3 business days of [name] receiving them.  If abnormal or we want to clarify a result, we will call or mychart you to make sure you receive the message.  If you have questions or concerns or don't hear within 5-7 days, please send [name] a message or call us.

## 2019-04-27 NOTE — Progress Notes (Signed)
Phone [phone]  Subjective:  In person visit [name] is a 68 y.o. year old very pleasant female patient who presents for/with See problem oriented charting Chief Complaint  Patient presents with  . Follow-up UTI ER trip     ROS- very mild dysuria but improving.  No fever/chills. Some abdominal discomfort including mild suprapubic pain.   Past Medical History-  Patient Active Problem List   Diagnosis Date Noted  . History of CVA in adulthood 08/13/2017    Priority: High  . S/P minimally invasive aortic valve replacement with bioprosthetic valve 08/23/2016    Priority: High  . Diastolic dysfunction     Priority: High  . Diabetes mellitus type 2, controlled ([place]) 05/12/2014    Priority: High  . Vitamin D deficiency 06/04/2018    Priority: Medium  . Hyponatremia 02/17/2018    Priority: Medium  . Hypertension associated with diabetes ([place]) 01/02/2017    Priority: Medium  . Osteopenia 05/27/2016    Priority: Medium  . OSA (obstructive sleep apnea) 02/27/2016    Priority: Medium  . Elevated alkaline phosphatase level 05/16/2015    Priority: Medium  . GERD (gastroesophageal reflux disease) 03/10/2014    Priority: Medium  . Generalized anxiety disorder 03/10/2014    Priority: Medium  . Migraines 03/10/2014    Priority: Medium  . Irritable bowel syndrome 12/15/2008    Priority: Medium  . OBESITY, MORBID 10/12/2008    Priority: Medium  . Hyperlipidemia associated with type 2 diabetes mellitus ([place]) 02/09/2008    Priority: Medium  . Blurry vision 08/13/2017    Priority: Low  . Bruxism 10/23/2015    Priority: Low  . Atypical chest pain 11/10/2014    Priority: Low  . Acne 05/12/2014    Priority: Low  . EUSTACHIAN TUBE DYSFUNCTION, CHRONIC 06/14/2010    Priority: Low  . Low back pain 08/11/2008    Priority: Low  . Allergic rhinitis 02/12/2007    Priority: Low  . ASTHMA 02/12/2007    Priority: Low    Medications- reviewed and updated Current  Outpatient Medications  Medication Sig Dispense Refill  . aspirin EC 81 MG tablet Take 1 tablet (81 mg total) by mouth daily. 90 tablet 3  . atorvastatin (LIPITOR) 40 MG tablet TAKE 1 TABLET (40 MG TOTAL) BY MOUTH DAILY AT 6 PM. 90 tablet 3  . Biotin 5000 MCG TABS Take 5,000 mcg by mouth daily.     . calcium-vitamin D (OSCAL WITH D) 500-200 MG-UNIT per tablet Take 2 tablets by mouth daily.     . cephALEXin (KEFLEX) 500 MG capsule Take 1 capsule (500 mg total) by mouth 4 (four) times daily. 28 capsule 0  . Cholecalciferol (VITAMIN D) 125 MCG (5000 UT) CAPS Take 5,000 Units by mouth daily.    . clindamycin (CLEOCIN) 300 MG capsule TAKE 2 CAPSULES BY MOUTH 1 HOUR PRIOR TOO DENTAL CLEANING. 2 capsule 0  . clopidogrel (PLAVIX) 75 MG tablet clopidogrel 75 mg tablet    . fluticasone (FLONASE) 50 MCG/ACT nasal spray Place 2 sprays into both nostrils daily.     [name] ipratropium (ATROVENT) 0.03 % nasal spray 2 SPRAYS IN EACH NOSTRIL AS NEEDED THREE TIMES AS NEEDED    . Lancets (ONETOUCH ULTRASOFT) lancets Use to test blood sugars daily. Dx: E11.9 100 each 12  . loratadine (CLARITIN) 10 MG tablet Take 10 mg by mouth at bedtime.     . Melatonin 5 MG TABS Take by mouth.    . metFORMIN (GLUCOPHAGE) 500  MG tablet Take 500 mg by mouth 2 (two) times daily with a meal. 1tab at breakfast and1/2 at dinner    . metoCLOPramide (REGLAN) 10 MG tablet TAKE 1/2 TABLET BY MOUTH 4 TIMES A DAY 60 tablet 5  . metoprolol tartrate (LOPRESSOR) 25 MG tablet Take 0.5 tablets (12.5 mg total) by mouth 2 (two) times daily. 45 tablet 3  . Multiple Vitamin (MULTIVITAMIN WITH MINERALS) TABS tablet Take 1 tablet by mouth daily.    . naproxen sodium (ALEVE) 220 MG tablet Take 220 mg by mouth.    [name] omeprazole (PRILOSEC OTC) 20 MG tablet Take 40 mg by mouth daily with breakfast.    . ONETOUCH VERIO test strip USE TO TEST BLOOD SUGARS DAILY. 100 strip 4  . Probiotic Product (ALIGN) 4 MG CAPS Take 4 mg by mouth daily.     . psyllium  (METAMUCIL SMOOTH TEXTURE) 28 % packet Take 1 packet by mouth daily before breakfast.     . venlafaxine XR (EFFEXOR-XR) 75 MG 24 hr capsule Take 1 capsule (75 mg total) by mouth daily with breakfast. 90 capsule 2   No current facility-administered medications for this visit.      Objective:  BP 114/60   Pulse 61   Temp (!) 97.3 F (36.3 C)   Ht 5\' 6"  (1.676 m)   Wt 203 lb 3.2 oz (92.2 kg)   LMP  (LMP Unknown)   SpO2 97%   BMI 32.80 kg/m  Gen: NAD, resting comfortably CV: RRR no  rubs or gallops Lungs: CTAB no crackles, wheeze, rhonchi Abdomen: soft/nontender except for mild tenderness suprapubic area/nondistended/normal bowel sounds. No rebound or guarding.  Skin: warm, dry    Assessment and Plan  UTI f/u- with hematuria S:Pt states she is still having stomach issues and she is not sure if it is from the antibiotic or from the UTI. She is still having occasional burning with urination but not as bad as when she went to the ER- much improved. Symptoms started out rather severe but she feels she has made significant improvement at this time.   Only 2 doses left of keflex- some suprapubic pain with this. Just tolerating pain- not taking anything for this.  A/P: UTI appears to be improving.  Did have hematuria with this- will plan on getting UA after finishing antibiotics. Encouraged to complete course.   Does still have decent tenderness on exam. Does have history of IBS which can make her tender as well. We opted if UA still with any concerning findings to extend antibiotics another 3 days. Otherwise monitor off antibiotics- I doubt she has both UTI and diverticulitis.   Recommended follow up:  Already scheduled for December regular follow up Future Appointments  Date Time Provider [place]  05/13/2019 12:00 PM [name], PA-C [facility] None  05/24/2019  3:15 PM Renato Shin, MD [facility] None  06/08/2019  9:20 AM [name], MD [facility]  06/14/2019   3:05 PM [facility] 1 [facility]  07/20/2019  1:30 PM Metta Clines R, DO [facility] None  12/31/2019 11:00 AM Parrett, Fonnie Mu, NP [facility] None   Lab/Order associations: E. Coli UTI   ICD-10-CM   1. Burning with urination  R30.0 POCT Urinalysis Dipstick (Automated)    CANCELED: Urine Culture    No orders of the defined types were placed in this encounter.   Return precautions advised.  Garret Reddish, MD

## 2019-04-28 ENCOUNTER — Encounter: Payer: Self-pay | Admitting: Family Medicine

## 2019-05-02 ENCOUNTER — Encounter: Payer: Self-pay | Admitting: Family Medicine

## 2019-05-03 ENCOUNTER — Encounter: Payer: Self-pay | Admitting: Family Medicine

## 2019-05-03 ENCOUNTER — Other Ambulatory Visit: Payer: Self-pay

## 2019-05-03 DIAGNOSIS — Z20822 Contact with and (suspected) exposure to covid-19: Secondary | ICD-10-CM

## 2019-05-03 DIAGNOSIS — Z20828 Contact with and (suspected) exposure to other viral communicable diseases: Secondary | ICD-10-CM

## 2019-05-04 ENCOUNTER — Encounter: Payer: Self-pay | Admitting: Family Medicine

## 2019-05-05 ENCOUNTER — Other Ambulatory Visit: Payer: Self-pay

## 2019-05-05 LAB — NOVEL CORONAVIRUS, NAA: SARS-CoV-2, NAA: NOT DETECTED

## 2019-05-06 ENCOUNTER — Encounter: Payer: Self-pay | Admitting: Family Medicine

## 2019-05-06 ENCOUNTER — Other Ambulatory Visit: Payer: Self-pay | Admitting: Family Medicine

## 2019-05-13 ENCOUNTER — Ambulatory Visit (INDEPENDENT_AMBULATORY_CARE_PROVIDER_SITE_OTHER): Payer: Medicare Other | Admitting: Family Medicine

## 2019-05-13 ENCOUNTER — Ambulatory Visit (INDEPENDENT_AMBULATORY_CARE_PROVIDER_SITE_OTHER): Payer: Medicare Other | Admitting: Physician Assistant

## 2019-05-13 ENCOUNTER — Encounter: Payer: Self-pay | Admitting: Family Medicine

## 2019-05-13 VITALS — Temp 98.4°F | Ht 66.0 in | Wt 197.4 lb

## 2019-05-13 DIAGNOSIS — B9689 Other specified bacterial agents as the cause of diseases classified elsewhere: Secondary | ICD-10-CM

## 2019-05-13 DIAGNOSIS — J329 Chronic sinusitis, unspecified: Secondary | ICD-10-CM | POA: Diagnosis not present

## 2019-05-13 MED ORDER — AZITHROMYCIN 250 MG PO TABS: ORAL_TABLET | ORAL | 0 refills | Status: DC

## 2019-05-13 MED ORDER — BENZONATATE 100 MG PO CAPS: 100.0000 mg | ORAL_CAPSULE | Freq: Two times a day (BID) | ORAL | 0 refills | Status: DC | PRN

## 2019-05-13 NOTE — Patient Instructions (Signed)
Health Maintenance Due  Topic Date Due  . MAMMOGRAM  03/20/2019   Depression screen [facility] 2/9 04/27/2019 03/09/2019 01/05/2019  Decreased Interest 0 0 0  Down, Depressed, Hopeless 0 0 0  PHQ - 2 Score 0 0 0  Altered sleeping 0 - 1  Tired, decreased energy 0 - 0  Change in appetite 0 - 0  Feeling bad or failure about yourself  0 - 0  Trouble concentrating 0 - 0  Moving slowly or fidgety/restless 0 - 0  Suicidal thoughts 0 - 0  PHQ-9 Score 0 - 1  Difficult doing work/chores Not difficult at all - Not difficult at all  Some recent data might be hidden

## 2019-05-13 NOTE — Progress Notes (Signed)
Phone [phone] Virtual visit via Video note   Subjective:  Chief complaint: Chief Complaint  Patient presents with  . virtual  . Cough  . Sore Throat    This visit type was conducted due to national recommendations for restrictions regarding the COVID-19 Pandemic (e.g. social distancing).  This format is felt to be most appropriate for this patient at this time balancing risks to patient and risks to population by having him in for in person visit.  No physical exam was performed (except for noted visual exam or audio findings with Telehealth visits).    Our team/I connected with Denym P Lemming at  1:00 PM EST by a video enabled telemedicine application ([email or web address] or caregility through epic) and verified that I am speaking with the correct person using two identifiers.  Location patient: [place] Location provider: [facility], office Persons participating in the virtual visit:  patient  Our team/I discussed the limitations of evaluation and management by telemedicine and the availability of in person appointments. In light of current covid-19 pandemic, patient also understands that we are trying to protect them by minimizing in office contact if at all possible.  The patient expressed consent for telemedicine visit and agreed to proceed. Patient understands insurance will be billed.   ROS-no fever/chills/nausea/vomiting.  Ongoing cough, postnasal drip, sinus drainage.  No shortness of breath  Past Medical History-  Patient Active Problem List   Diagnosis Date Noted  . History of CVA in adulthood 08/13/2017    Priority: High  . S/P minimally invasive aortic valve replacement with bioprosthetic valve 08/23/2016    Priority: High  . Diastolic dysfunction     Priority: High  . Diabetes mellitus type 2, controlled ([place]) 05/12/2014    Priority: High  . Vitamin D deficiency 06/04/2018    Priority: Medium  . Hyponatremia 02/17/2018    Priority: Medium  . Hypertension associated  with diabetes ([place]) 01/02/2017    Priority: Medium  . Osteopenia 05/27/2016    Priority: Medium  . OSA (obstructive sleep apnea) 02/27/2016    Priority: Medium  . Elevated alkaline phosphatase level 05/16/2015    Priority: Medium  . GERD (gastroesophageal reflux disease) 03/10/2014    Priority: Medium  . Generalized anxiety disorder 03/10/2014    Priority: Medium  . Migraines 03/10/2014    Priority: Medium  . Irritable bowel syndrome 12/15/2008    Priority: Medium  . OBESITY, MORBID 10/12/2008    Priority: Medium  . Hyperlipidemia associated with type 2 diabetes mellitus ([place]) 02/09/2008    Priority: Medium  . Blurry vision 08/13/2017    Priority: Low  . Bruxism 10/23/2015    Priority: Low  . Atypical chest pain 11/10/2014    Priority: Low  . Acne 05/12/2014    Priority: Low  . EUSTACHIAN TUBE DYSFUNCTION, CHRONIC 06/14/2010    Priority: Low  . Low back pain 08/11/2008    Priority: Low  . Allergic rhinitis 02/12/2007    Priority: Low  . ASTHMA 02/12/2007    Priority: Low    Medications- reviewed and updated Current Outpatient Medications  Medication Sig Dispense Refill  . aspirin EC 81 MG tablet Take 1 tablet (81 mg total) by mouth daily. 90 tablet 3  . atorvastatin (LIPITOR) 40 MG tablet TAKE 1 TABLET (40 MG TOTAL) BY MOUTH DAILY AT 6 PM. 90 tablet 3  . Biotin 5000 MCG TABS Take 5,000 mcg by mouth daily.     . calcium-vitamin D (OSCAL WITH D) 500-200 MG-UNIT per tablet Take  2 tablets by mouth daily.     . Cholecalciferol (VITAMIN D) 125 MCG (5000 UT) CAPS Take 5,000 Units by mouth daily.    . clindamycin (CLEOCIN) 300 MG capsule TAKE 2 CAPSULES BY MOUTH 1 HOUR PRIOR TOO DENTAL CLEANING. 2 capsule 0  . clopidogrel (PLAVIX) 75 MG tablet clopidogrel 75 mg tablet    . fluticasone (FLONASE) 50 MCG/ACT nasal spray Place 2 sprays into both nostrils daily.     [name] ipratropium (ATROVENT) 0.03 % nasal spray 2 SPRAYS IN EACH NOSTRIL AS NEEDED THREE TIMES AS NEEDED    . Lancets  (ONETOUCH DELICA PLUS 123XX123) MISC USE TO TEST BLOOD SUGARS DAILY. DX: E11.9 100 each 12  . loratadine (CLARITIN) 10 MG tablet Take 10 mg by mouth at bedtime.     . Melatonin 5 MG TABS Take by mouth.    . metFORMIN (GLUCOPHAGE) 500 MG tablet Take 500 mg by mouth 2 (two) times daily with a meal. 1tab at breakfast and1/2 at dinner    . metoCLOPramide (REGLAN) 10 MG tablet TAKE 1/2 TABLET BY MOUTH 4 TIMES A DAY 60 tablet 5  . metoprolol tartrate (LOPRESSOR) 25 MG tablet Take 0.5 tablets (12.5 mg total) by mouth 2 (two) times daily. 45 tablet 3  . Multiple Vitamin (MULTIVITAMIN WITH MINERALS) TABS tablet Take 1 tablet by mouth daily.    . naproxen sodium (ALEVE) 220 MG tablet Take 220 mg by mouth.    [name] omeprazole (PRILOSEC OTC) 20 MG tablet Take 40 mg by mouth daily with breakfast.    . ONETOUCH VERIO test strip USE TO TEST BLOOD SUGARS DAILY. 100 strip 4  . Probiotic Product (ALIGN) 4 MG CAPS Take 4 mg by mouth daily.     . psyllium (METAMUCIL SMOOTH TEXTURE) 28 % packet Take 1 packet by mouth daily before breakfast.     . venlafaxine XR (EFFEXOR-XR) 75 MG 24 hr capsule Take 1 capsule (75 mg total) by mouth daily with breakfast. 90 capsule 2  . azithromycin (ZITHROMAX) 250 MG tablet Take 2 tabs on day 1, then 1 tab daily until finished 6 tablet 0  . benzonatate (TESSALON) 100 MG capsule Take 1 capsule (100 mg total) by mouth 2 (two) times daily as needed for cough. 20 capsule 0   No current facility-administered medications for this visit.      Objective:  Temp 98.4 F (36.9 C)   Ht 5\' 6"  (1.676 m)   Wt 197 lb 6.4 oz (89.5 kg)   LMP  (LMP Unknown)   BMI 31.86 kg/m  self reported vitals Gen: NAD, resting comfortably Coughs intermittently through visit Lungs: nonlabored, normal respiratory rate  Skin: appears dry, no obvious rash     Assessment and Plan   Cough/Sore Throat S:Pt states she as a cough that is productive with yellow phlegm that has been going on for 3 weeks. The sore  throat has gotten better and she thinks that is coming from the coughing. Pt tested neg for covid on 05/03/2019 which was over 7 days into illness. Getting a lot of post nasal drip. Feels like coughing from chest. Discharge has gotten progressively yellow and cream color initially.  Mild sore throat but likely related to postnasal drip  Has been taking mucinex DM for 3 weeks- seems to at least slow the coughing down.  A/P: With 3 weeks of cough and nasal congestion and postnasal drip-there is a reasonable chance this could represent bacterial sinusitis.  She has penicillin and Augmentin allergy.  She does tolerate azithromycin very well-we discussed for sinusitis there is about a 20% chance of resistance but we opted to give this a try first.  This would also provide most basic coverage for community-acquired pneumonia.  We discussed there is also possibility of bronchitis with deeper cough which has a 95% chance of being viral-but I still think it is reasonable to try to cover for potential bacterial sinusitis and if there is atypical bacteria involved in a potential bronchitis-she would have some coverage -He also request prescription cough suppressant-Tessalon was prescribed -She knows if she develops new or worsening symptoms such as shortness of breath that she needs to contact us immediately -Already tested negative for COVID-19 as above and I do not strongly suspect false negative result  Recommended follow up: December physical planned Future Appointments  Date Time Provider [place]  05/24/2019  3:15 PM Renato Shin, MD [facility] None  06/01/2019 12:30 PM [name], PA-C [facility] None  06/08/2019  9:20 AM [name], MD [facility]  06/14/2019  3:05 PM [facility] 1 [facility]  07/20/2019  1:30 PM Metta Clines R, DO [facility] None  12/31/2019 11:00 AM Parrett, Fonnie Mu, NP [facility] None   Lab/Order associations:   ICD-10-CM   1. Bacterial sinusitis   J32.9    B96.89   New acute condition requiring medication management  Meds ordered this encounter  Medications  . azithromycin (ZITHROMAX) 250 MG tablet    Sig: Take 2 tabs on day 1, then 1 tab daily until finished    Dispense:  6 tablet    Refill:  0  . benzonatate (TESSALON) 100 MG capsule    Sig: Take 1 capsule (100 mg total) by mouth 2 (two) times daily as needed for cough.    Dispense:  20 capsule    Refill:  0    Return precautions advised.  Garret Reddish, MD

## 2019-05-24 ENCOUNTER — Other Ambulatory Visit: Payer: Self-pay

## 2019-05-24 ENCOUNTER — Ambulatory Visit (INDEPENDENT_AMBULATORY_CARE_PROVIDER_SITE_OTHER): Payer: Medicare Other | Admitting: Endocrinology

## 2019-05-24 ENCOUNTER — Encounter: Payer: Self-pay | Admitting: Endocrinology

## 2019-05-24 VITALS — BP 138/70 | HR 64 | Ht 66.0 in | Wt 207.0 lb

## 2019-05-24 DIAGNOSIS — E119 Type 2 diabetes mellitus without complications: Secondary | ICD-10-CM

## 2019-05-24 LAB — POCT GLYCOSYLATED HEMOGLOBIN (HGB A1C): Hemoglobin A1C: 5.7 % — AB (ref 4.0–5.6)

## 2019-05-24 MED ORDER — DEMECLOCYCLINE HCL 300 MG PO TABS: 300.0000 mg | ORAL_TABLET | Freq: Every day | ORAL | 11 refills | Status: DC

## 2019-05-24 NOTE — Progress Notes (Signed)
Subjective:    Patient ID: [name], female    DOB: 1950-11-15, 68 y.o.   MRN: [id number]  HPI Pt returns for f/u of hyponatremia (dx'ed 2013; only cause found was reglan, which she cannot safely stop; she takes demeclocycline; ACTH stim test, creat, and TFT were normal; MRI showed old CVA's).  pt states she feels well in general.  She takes meds as rx'ed Past Medical History:  Diagnosis Date  . Allergy   . Anxiety   . Arthritis    back & knee  . Asthma     mild per pt shows up with resp illness  . Chronic diastolic congestive heart failure ([place])   . Colon polyps   . Constipation   . Diabetes mellitus without complication ([place])   . Dyspnea   . Gastric polyps   . Gastroparesis   . GERD (gastroesophageal reflux disease)   . Headache    sinus headaches and migraines at times  . Heart murmur   . History of migraine headaches   . HTN (hypertension)   . Hyperlipidemia   . IBS (irritable bowel syndrome)   . Joint pain   . Lumbar disc disease   . S/P aortic valve replacement with bioprosthetic valve 08/23/2016   25 mm Edwards Intuity rapid-deployment bovine pericardial tissue valve via partial upper mini sternotomy  . Sleep apnea   . TIA (transient ischemic attack)     Past Surgical History:  Procedure Laterality Date  . ABDOMINAL HYSTERECTOMY    . AORTIC VALVE REPLACEMENT N/A 08/23/2016   Procedure: AORTIC VALVE REPLACEMENT (AVR) - using partial Upper Sternotomy- 37mm Edwards Intuity Aortic Valve used;  Surgeon: Rexene Alberts, MD;  Location: [facility];  Service: Open Heart Surgery;  Laterality: N/A;  . BLADDER SUSPENSION  2007  . BREAST ENHANCEMENT SURGERY  1980  . CARPAL TUNNEL RELEASE Bilateral   . COLONOSCOPY    . DORSAL COMPARTMENT RELEASE Right 09/15/2014   Procedure: RIGHT WRIST DEQUERVAINS RELEASE ;  Surgeon: Kathryne Hitch, MD;  Location: [facility];  Service: Orthopedics;  Laterality: Right;  . ESOPHAGOGASTRODUODENOSCOPY    . KNEE ARTHROSCOPY   04/15/2012   Procedure: ARTHROSCOPY KNEE;  Surgeon: [name], MD;  Location: [facility];  Service: Orthopedics;  Laterality: Right;  . LAPAROSCOPIC CHOLECYSTECTOMY    . LASIK    . OOPHORECTOMY    . PALATE TO GINGIVA GRAFT  2017  . RIGHT/LEFT HEART CATH AND CORONARY ANGIOGRAPHY N/A 08/02/2016   Procedure: Right/Left Heart Cath and Coronary Angiography;  Surgeon: Sherren Mocha, MD;  Location: [facility];  Service: Cardiovascular;  Laterality: N/A;  . ROTATOR CUFF REPAIR Left   . TEE WITHOUT CARDIOVERSION N/A 08/23/2016   Procedure: TRANSESOPHAGEAL ECHOCARDIOGRAM (TEE);  Surgeon: Rexene Alberts, MD;  Location: [facility];  Service: Open Heart Surgery;  Laterality: N/A;  . TOTAL KNEE ARTHROPLASTY  04/15/2012   Procedure: TOTAL KNEE ARTHROPLASTY;  Surgeon: [name], MD;  Location: [facility];  Service: Orthopedics;  Laterality: Right;  . TRIGGER FINGER RELEASE Right 04/20/2015   Procedure: RIGHT TRIGGER FINGER RELEASE (TENDON SHEALTH INCISION) ;  Surgeon: [name], MD;  Location: [facility];  Service: Orthopedics;  Laterality: Right;    Social History   Socioeconomic History  . Marital status: Married    Spouse name: [name]  . Number of children: 1  . Years of education: Not on file  . Highest education level: Not on file  Occupational  History  . Occupation: Retired, Curator: RETIRED  Social Needs  . Financial resource strain: Not on file  . Food insecurity    Worry: Not on file    Inability: Not on file  . Transportation needs    Medical: Not on file    Non-medical: Not on file  Tobacco Use  . Smoking status: Never Smoker  . Smokeless tobacco: Never Used  Substance and Sexual Activity  . Alcohol use: No  . Drug use: No  . Sexual activity: Yes  Lifestyle  . Physical activity    Days per week: Not on file    Minutes per session: Not on file  . Stress: Not on file  Relationships  . Social Herbalist on phone: Not  on file    Gets together: Not on file    Attends religious service: Not on file    Active member of club or organization: Not on file    Attends meetings of clubs or organizations: Not on file    Relationship status: Not on file  . Intimate partner violence    Fear of current or ex partner: Not on file    Emotionally abused: Not on file    Physically abused: Not on file    Forced sexual activity: Not on file  Other Topics Concern  . Not on file  Social History Narrative   She lives with husband (1978) and two dogs. Step-son [name] (1971-psychiatrist in [place]). No grandkids. 2 dogs-sheltie/collie mix and border collie mix      Highest level of education:  Master in education   She is retired Animal nutritionist x 32 years.      Hobbies: time with dogs             Current Outpatient Medications on File Prior to Visit  Medication Sig Dispense Refill  . aspirin EC 81 MG tablet Take 1 tablet (81 mg total) by mouth daily. 90 tablet 3  . atorvastatin (LIPITOR) 40 MG tablet TAKE 1 TABLET (40 MG TOTAL) BY MOUTH DAILY AT 6 PM. 90 tablet 3  . benzonatate (TESSALON) 100 MG capsule Take 1 capsule (100 mg total) by mouth 2 (two) times daily as needed for cough. 20 capsule 0  . Biotin 5000 MCG TABS Take 5,000 mcg by mouth daily.     . calcium-vitamin D (OSCAL WITH D) 500-200 MG-UNIT per tablet Take 2 tablets by mouth daily.     . Cholecalciferol (VITAMIN D) 125 MCG (5000 UT) CAPS Take 5,000 Units by mouth daily.    . clindamycin (CLEOCIN) 300 MG capsule TAKE 2 CAPSULES BY MOUTH 1 HOUR PRIOR TOO DENTAL CLEANING. 2 capsule 0  . clopidogrel (PLAVIX) 75 MG tablet clopidogrel 75 mg tablet    . fluticasone (FLONASE) 50 MCG/ACT nasal spray Place 2 sprays into both nostrils daily.     [name] ipratropium (ATROVENT) 0.03 % nasal spray 2 SPRAYS IN EACH NOSTRIL AS NEEDED THREE TIMES AS NEEDED    . Lancets (ONETOUCH DELICA PLUS 123XX123) MISC USE TO TEST BLOOD SUGARS DAILY. DX: E11.9 100 each 12  .  loratadine (CLARITIN) 10 MG tablet Take 10 mg by mouth at bedtime.     . Melatonin 5 MG TABS Take by mouth.    . metFORMIN (GLUCOPHAGE) 500 MG tablet Take 500 mg by mouth 2 (two) times daily with a meal. 1tab at breakfast and1/2 at dinner    . metoCLOPramide (REGLAN) 10  MG tablet TAKE 1/2 TABLET BY MOUTH 4 TIMES A DAY 60 tablet 5  . metoprolol tartrate (LOPRESSOR) 25 MG tablet Take 0.5 tablets (12.5 mg total) by mouth 2 (two) times daily. 45 tablet 3  . Multiple Vitamin (MULTIVITAMIN WITH MINERALS) TABS tablet Take 1 tablet by mouth daily.    . naproxen sodium (ALEVE) 220 MG tablet Take 220 mg by mouth.    [name] omeprazole (PRILOSEC OTC) 20 MG tablet Take 40 mg by mouth daily with breakfast.    . ONETOUCH VERIO test strip USE TO TEST BLOOD SUGARS DAILY. 100 strip 4  . Probiotic Product (ALIGN) 4 MG CAPS Take 4 mg by mouth daily.     . psyllium (METAMUCIL SMOOTH TEXTURE) 28 % packet Take 1 packet by mouth daily before breakfast.     . venlafaxine XR (EFFEXOR-XR) 75 MG 24 hr capsule Take 1 capsule (75 mg total) by mouth daily with breakfast. 90 capsule 2   No current facility-administered medications on file prior to visit.     Allergies  Allergen Reactions  . Augmentin [Amoxicillin-Pot Clavulanate] Rash    rash  . Erythromycin Nausea Only  . Penicillins Itching and Rash    Has patient had a PCN reaction causing immediate rash, facial/tongue/throat swelling, SOB or lightheadedness with hypotension: No Has patient had a PCN reaction causing severe rash involving mucus membranes or skin necrosis: No Has patient had a PCN reaction that required hospitalization: No Has patient had a PCN reaction occurring within the last 10 years: No  If all of the above answers are "NO", then may proceed with Cephalosporin use.     Family History  Problem Relation Age of Onset  . COPD Mother   . Colon polyps Mother   . Irritable bowel syndrome Mother   . Anxiety disorder Mother   . Heart disease Father    . Alcohol abuse Father   . Colon polyps Maternal Aunt   . Other Neg Hx        hyponatremia    BP 138/70   Pulse 64   Ht 5\' 6"  (1.676 m)   Wt 207 lb (93.9 kg)   LMP  (LMP Unknown)   SpO2 98%   BMI 33.41 kg/m    Review of Systems Denies sob    Objective:   Physical Exam VITAL SIGNS:  See vs page GENERAL: no distress Ext: trace bilat leg edema  Lab Results  Component Value Date   CREATININE 0.69 02/11/2019   BUN 18 02/11/2019   NA 137 02/11/2019   K 5.4 (H) 02/11/2019   CL 99 02/11/2019   CO2 23 02/11/2019       Assessment & Plan:  Hyponatremia, due for recheck.  Edema: she has a tendency to volume overload, so she may need lasix also.  Patient Instructions  Please try to reduce your fluid intake to 2 quarts per day, although this is difficult.  The easiest way to do this is to not drink during meals.   Please check the salt level when you see Dr Yong Channel.   Please continue the same demeclocycline.  Please come back for a follow-up appointment in 4 months.  You should have the sodium checked sooner if you get a stomach flu (vomiting or diarrhea).

## 2019-05-24 NOTE — Patient Instructions (Addendum)
Please try to reduce your fluid intake to 2 quarts per day, although this is difficult.  The easiest way to do this is to not drink during meals.   Please check the salt level when you see Dr Yong Channel.   Please continue the same demeclocycline.  Please come back for a follow-up appointment in 4 months.  You should have the sodium checked sooner if you get a stomach flu (vomiting or diarrhea).

## 2019-05-25 ENCOUNTER — Encounter: Payer: Self-pay | Admitting: Family Medicine

## 2019-05-26 ENCOUNTER — Other Ambulatory Visit: Payer: Self-pay

## 2019-05-28 ENCOUNTER — Other Ambulatory Visit: Payer: Medicare Other

## 2019-05-28 ENCOUNTER — Encounter: Payer: Self-pay | Admitting: Family Medicine

## 2019-05-28 ENCOUNTER — Ambulatory Visit (INDEPENDENT_AMBULATORY_CARE_PROVIDER_SITE_OTHER): Payer: Medicare Other | Admitting: Family Medicine

## 2019-05-28 ENCOUNTER — Ambulatory Visit (INDEPENDENT_AMBULATORY_CARE_PROVIDER_SITE_OTHER): Payer: Medicare Other

## 2019-05-28 ENCOUNTER — Other Ambulatory Visit: Payer: Self-pay

## 2019-05-28 VITALS — BP 122/60 | HR 51 | Temp 98.0°F | Ht 66.0 in | Wt 203.8 lb

## 2019-05-28 DIAGNOSIS — M79671 Pain in right foot: Secondary | ICD-10-CM

## 2019-05-28 NOTE — Patient Instructions (Addendum)
Health Maintenance Due  Topic Date Due  . MAMMOGRAM - please have them send [name] a copy when you get this updated.  03/20/2019   Schedule x-ray slot at [facility] at the desk before you go  When you arrive at [facility]- please call [phone] TO CHECK IN BY PHONE.  I think the first step is to rule out a fracture. If no fracture its possible the capsule around the joint is irritated- I think seeing Dr. Percell Miller would be reasonable in that case.   I do not think this is gout but I would certainly be open to Dr. Debroah Loop opinion and I would be happy to check uric acid for you next visit for your physical on the 15th.

## 2019-05-28 NOTE — Progress Notes (Signed)
Phone [phone] In person visit   Subjective:   [name] is a 67 y.o. year old very pleasant female patient who presents for/with See problem oriented charting Chief Complaint  Patient presents with  . Follow-up  . Gout   ROS-no fever/chills.  No warmth or erythema on the foot-does have significant pain at base of second toe  This visit occurred during the SARS-CoV-2 public health emergency.  Safety protocols were in place, including screening questions prior to the visit, additional usage of staff PPE, and extensive cleaning of exam room while observing appropriate contact time as indicated for disinfecting solutions.   Past Medical History-  Patient Active Problem List   Diagnosis Date Noted  . History of CVA in adulthood 08/13/2017    Priority: High  . S/P minimally invasive aortic valve replacement with bioprosthetic valve 08/23/2016    Priority: High  . Diastolic dysfunction     Priority: High  . Diabetes mellitus type 2, controlled ([place]) 05/12/2014    Priority: High  . Vitamin D deficiency 06/04/2018    Priority: Medium  . Hyponatremia 02/17/2018    Priority: Medium  . Hypertension associated with diabetes ([place]) 01/02/2017    Priority: Medium  . Osteopenia 05/27/2016    Priority: Medium  . OSA (obstructive sleep apnea) 02/27/2016    Priority: Medium  . Elevated alkaline phosphatase level 05/16/2015    Priority: Medium  . GERD (gastroesophageal reflux disease) 03/10/2014    Priority: Medium  . Generalized anxiety disorder 03/10/2014    Priority: Medium  . Migraines 03/10/2014    Priority: Medium  . Irritable bowel syndrome 12/15/2008    Priority: Medium  . OBESITY, MORBID 10/12/2008    Priority: Medium  . Hyperlipidemia associated with type 2 diabetes mellitus ([place]) 02/09/2008    Priority: Medium  . Blurry vision 08/13/2017    Priority: Low  . Bruxism 10/23/2015    Priority: Low  . Atypical chest pain 11/10/2014    Priority: Low  . Acne  05/12/2014    Priority: Low  . EUSTACHIAN TUBE DYSFUNCTION, CHRONIC 06/14/2010    Priority: Low  . Low back pain 08/11/2008    Priority: Low  . Allergic rhinitis 02/12/2007    Priority: Low  . ASTHMA 02/12/2007    Priority: Low    Medications- reviewed and updated Current Outpatient Medications  Medication Sig Dispense Refill  . aspirin EC 81 MG tablet Take 1 tablet (81 mg total) by mouth daily. 90 tablet 3  . atorvastatin (LIPITOR) 40 MG tablet TAKE 1 TABLET (40 MG TOTAL) BY MOUTH DAILY AT 6 PM. 90 tablet 3  . Biotin 5000 MCG TABS Take 5,000 mcg by mouth daily.     . calcium-vitamin D (OSCAL WITH D) 500-200 MG-UNIT per tablet Take 2 tablets by mouth daily.     . Cholecalciferol (VITAMIN D) 125 MCG (5000 UT) CAPS Take 5,000 Units by mouth daily.    . clopidogrel (PLAVIX) 75 MG tablet clopidogrel 75 mg tablet    . fluticasone (FLONASE) 50 MCG/ACT nasal spray Place 2 sprays into both nostrils daily.     [name] ipratropium (ATROVENT) 0.03 % nasal spray 2 SPRAYS IN EACH NOSTRIL AS NEEDED THREE TIMES AS NEEDED    . Lancets (ONETOUCH DELICA PLUS 123XX123) MISC USE TO TEST BLOOD SUGARS DAILY. DX: E11.9 100 each 12  . loratadine (CLARITIN) 10 MG tablet Take 10 mg by mouth at bedtime.     . Melatonin 5 MG TABS Take by mouth.    [name]  metFORMIN (GLUCOPHAGE) 500 MG tablet Take 500 mg by mouth 2 (two) times daily with a meal. 1tab at breakfast and1/2 at dinner    . metoCLOPramide (REGLAN) 10 MG tablet TAKE 1/2 TABLET BY MOUTH 4 TIMES A DAY 60 tablet 5  . metoprolol tartrate (LOPRESSOR) 25 MG tablet Take 0.5 tablets (12.5 mg total) by mouth 2 (two) times daily. 45 tablet 3  . Multiple Vitamin (MULTIVITAMIN WITH MINERALS) TABS tablet Take 1 tablet by mouth daily.    . naproxen sodium (ALEVE) 220 MG tablet Take 220 mg by mouth.    [name] omeprazole (PRILOSEC OTC) 20 MG tablet Take 40 mg by mouth daily with breakfast.    . ONETOUCH VERIO test strip USE TO TEST BLOOD SUGARS DAILY. 100 strip 4  . Probiotic Product  (ALIGN) 4 MG CAPS Take 4 mg by mouth daily.     . psyllium (METAMUCIL SMOOTH TEXTURE) 28 % packet Take 1 packet by mouth daily before breakfast.     . venlafaxine XR (EFFEXOR-XR) 75 MG 24 hr capsule Take 1 capsule (75 mg total) by mouth daily with breakfast. 90 capsule 2   No current facility-administered medications for this visit.      Objective:  BP 122/60   Pulse (!) 51   Temp 98 F (36.7 C)   Ht 5\' 6"  (1.676 m)   Wt 203 lb 12.8 oz (92.4 kg)   LMP  (LMP Unknown)   SpO2 99%   BMI 32.89 kg/m  Gen: NAD, resting comfortably  CV: RRR  Lungs: nonlabored, normal respiratory rate Abdomen: soft/nondistended  MSK: Right foot with normal mobility.  She does have significant tenderness at right MTP joint and proximal to MTP joint but not distal to it.  She also has slight tenderness with palpation at third MTP.  No warmth or erythema noted on the foot.  Good range of motion of toes without significant worsening of pain      Assessment and Plan   right 2nd toe pain.  S: Pt states her feet have been bothering her for about a month. It is in her right second toe and it bothers her on the bottom of that toe, her other toes are fine.  Had been more sedentary when ill in November and as she has become more active the pain has worsened- she was hoping would get better with time.   Her pain level with walking is 7-8/10. At rest pain level is is 4-5/10. Rest and elevation help. Has never gotten red/hot. She was worried it could be gout. Denies trauma or anything falling on the foot. Has not taken any medicine like tylenol for this.  A/P: Right second toe pain of unknown etiology.  Given severity of pain and duration we opted to get x-rays to rule out fracture.  We discussed potential diagnoses like capsulitis and possibility of steroids.  She would prefer injection over oral therapy-she has an orthopedist Dr. Percell Miller and she agrees to follow-up with him if she fails to improve -I told her I did not  think this was likely gout without significant warmth or erythema at any point.  I did tell her if Dr. Percell Miller thought gout was a possibility I would be happy to draw uric acid at her physical on the 15th -Her last A1c was excellent at 5.7 and I believe she could tolerate steroid injection if needed. -Consider course of NSAIDs but is on Plavix so less than ideal.  Fortunately kidney function is pretty good  -  Had to send to [facility]  Recommended follow up: Scheduled on December 15 Future Appointments  Date Time Provider [place]  05/28/2019  3:40 PM [facility]  06/01/2019 12:30 PM [name], PA-C [facility] None  06/08/2019  9:20 AM [name], MD [facility] PEC  06/14/2019  3:05 PM [facility] E[facility] 1 [id number]  07/20/2019  1:30 PM Pieter Partridge, DO [facility] None  09/28/2019  3:00 PM Renato Shin, MD [facility] None  12/31/2019 11:00 AM Parrett, Fonnie Mu, NP [facility] None   Lab/Order associations:   ICD-10-CM   1. Right foot pain  M79.671 DG Foot Complete Right    Return precautions advised.  Garret Reddish, MD

## 2019-06-01 ENCOUNTER — Ambulatory Visit (INDEPENDENT_AMBULATORY_CARE_PROVIDER_SITE_OTHER): Payer: Medicare Other | Admitting: Family Medicine

## 2019-06-01 ENCOUNTER — Encounter (INDEPENDENT_AMBULATORY_CARE_PROVIDER_SITE_OTHER): Payer: Self-pay | Admitting: Family Medicine

## 2019-06-01 ENCOUNTER — Other Ambulatory Visit: Payer: Self-pay

## 2019-06-01 VITALS — BP 130/52 | HR 46 | Temp 98.8°F | Ht 66.0 in | Wt 198.0 lb

## 2019-06-01 DIAGNOSIS — E1159 Type 2 diabetes mellitus with other circulatory complications: Secondary | ICD-10-CM | POA: Diagnosis not present

## 2019-06-01 DIAGNOSIS — E785 Hyperlipidemia, unspecified: Secondary | ICD-10-CM

## 2019-06-01 DIAGNOSIS — I10 Essential (primary) hypertension: Secondary | ICD-10-CM

## 2019-06-01 DIAGNOSIS — E119 Type 2 diabetes mellitus without complications: Secondary | ICD-10-CM

## 2019-06-01 DIAGNOSIS — I152 Hypertension secondary to endocrine disorders: Secondary | ICD-10-CM

## 2019-06-01 DIAGNOSIS — E669 Obesity, unspecified: Secondary | ICD-10-CM

## 2019-06-01 DIAGNOSIS — E1169 Type 2 diabetes mellitus with other specified complication: Secondary | ICD-10-CM | POA: Diagnosis not present

## 2019-06-01 DIAGNOSIS — Z6832 Body mass index (BMI) 32.0-32.9, adult: Secondary | ICD-10-CM | POA: Diagnosis not present

## 2019-06-02 NOTE — Progress Notes (Signed)
Office: [phone]  /  Fax: [phone]   HPI:   Chief Complaint: OBESITY [name] is here to discuss her progress with her obesity treatment plan. She is on the Category 3 plan + 200 snack calories and is following her eating plan approximately 70 % of the time. She states she is exercising 0 minutes 0 times per week.  [name] is feeling better (recent UTI, URI, and foot injury). She will see her primary care physician next week for labs and CPE. She is happy to get back to diet and exercise plan. She is hoping to lose another 20-30 lbs. Her goal is to be able to stop her medications.   Her weight is 198 lb (89.8 kg) today and has gained 1 lb since her last visit. She has lost 22 lbs since starting treatment with [name].  Diabetes II [name] has a diagnosis of diabetes type II. [name] is taking metformin and levels are at goal. She has been working on intensive lifestyle modifications including diet, exercise, and weight loss to help control her blood glucose levels.  Hypertension [name] is a 68 y.o. female with hypertension, with diabetes mellitus. [name]'s blood pressure is at goal on Lopressor. She is working on weight loss to help control her blood pressure with the goal of decreasing her risk of heart attack and stroke.    Hyperlipidemia [name] has a diagnosis of hyperlipidemia with diabetes mellitus. Her levels are at goal. She has been trying to improve her cholesterol levels with intensive lifestyle modification including a low saturated fat diet, exercise and weight loss. She denies any chest pain, claudication or myalgias.  ASSESSMENT AND PLAN:  Type 2 diabetes mellitus without complication, without long-term current use of insulin (HCC)  Hypertension associated with diabetes ([place])  Hyperlipidemia associated with type 2 diabetes mellitus ([place])  Class 1 obesity with serious comorbidity and body mass index (BMI) of 32.0 to 32.9 in adult,  unspecified obesity type  PLAN:  Diabetes II [name] has been given diabetes education by myself today. Good blood sugar control is important to decrease the likelihood of diabetic complications such as nephropathy, neuropathy, limb loss, blindness, coronary artery disease, and death. Intensive lifestyle modification including diet, exercise and weight loss were discussed as the first line treatment for diabetes. [name] agrees to continue her medications and she agrees to follow up with our clinic in 2 weeks.   Hypertension [name] is working on healthy weight loss and exercise to improve blood pressure control. We will watch for signs of hypotension as she continues her lifestyle modifications. [name] agrees to continue her medications and she agrees to follow up with our clinic in 2 weeks.  Hyperlipidemia Intensive lifestyle modifications as the first line treatment for hyperlipidemia. We discussed many lifestyle modifications today and [name] will continue to work on diet, exercise and weight loss efforts. [name] agrees to continue her medications and she agrees to follow up with our clinic in 2 weeks.  Obesity [name] is currently in the action stage of change. As such, her goal is to continue with weight loss efforts. She has agreed to follow the Category 3 plan.  [name] has been instructed to work up to a goal of 150 minutes of combined cardio and strengthening exercise per week for weight loss and overall health benefits.  We discussed the following Behavioral Modification Strategies today: increasing lean protein intake, decreasing simple carbohydrates, increasing vegetables, increase H20 intake, work on meal planning and easy cooking plans, holiday eating strategies  and decrease liquid calories  [name] has agreed to follow up with our clinic in 2 weeks. She was informed of the importance of frequent follow up visits to maximize her success with  intensive lifestyle modifications for her multiple health conditions.  ALLERGIES: Allergies  Allergen Reactions  . Augmentin [Amoxicillin-Pot Clavulanate] Rash    rash  . Erythromycin Nausea Only  . Penicillins Itching and Rash    Has patient had a PCN reaction causing immediate rash, facial/tongue/throat swelling, SOB or lightheadedness with hypotension: No Has patient had a PCN reaction causing severe rash involving mucus membranes or skin necrosis: No Has patient had a PCN reaction that required hospitalization: No Has patient had a PCN reaction occurring within the last 10 years: No  If all of the above answers are "NO", then may proceed with Cephalosporin use.    MEDICATIONS: Current Outpatient Medications on File Prior to Visit  Medication Sig Dispense Refill  . aspirin EC 81 MG tablet Take 1 tablet (81 mg total) by mouth daily. 90 tablet 3  . atorvastatin (LIPITOR) 40 MG tablet TAKE 1 TABLET (40 MG TOTAL) BY MOUTH DAILY AT 6 PM. 90 tablet 3  . Biotin 5000 MCG TABS Take 5,000 mcg by mouth daily.     . calcium-vitamin D (OSCAL WITH D) 500-200 MG-UNIT per tablet Take 2 tablets by mouth daily.     . Cholecalciferol (VITAMIN D) 125 MCG (5000 UT) CAPS Take 5,000 Units by mouth daily.    . clopidogrel (PLAVIX) 75 MG tablet clopidogrel 75 mg tablet    . fluticasone (FLONASE) 50 MCG/ACT nasal spray Place 2 sprays into both nostrils daily.     [name] ipratropium (ATROVENT) 0.03 % nasal spray 2 SPRAYS IN EACH NOSTRIL AS NEEDED THREE TIMES AS NEEDED    . Lancets (ONETOUCH DELICA PLUS 123XX123) MISC USE TO TEST BLOOD SUGARS DAILY. DX: E11.9 100 each 12  . loratadine (CLARITIN) 10 MG tablet Take 10 mg by mouth at bedtime.     . Melatonin 5 MG TABS Take by mouth.    . metFORMIN (GLUCOPHAGE) 500 MG tablet Take 500 mg by mouth 2 (two) times daily with a meal. 1tab at breakfast and1/2 at dinner    . metoCLOPramide (REGLAN) 10 MG tablet TAKE 1/2 TABLET BY MOUTH 4 TIMES A DAY 60 tablet 5  . metoprolol  tartrate (LOPRESSOR) 25 MG tablet Take 0.5 tablets (12.5 mg total) by mouth 2 (two) times daily. 45 tablet 3  . Multiple Vitamin (MULTIVITAMIN WITH MINERALS) TABS tablet Take 1 tablet by mouth daily.    . naproxen sodium (ALEVE) 220 MG tablet Take 220 mg by mouth.    [name] omeprazole (PRILOSEC OTC) 20 MG tablet Take 40 mg by mouth daily with breakfast.    . ONETOUCH VERIO test strip USE TO TEST BLOOD SUGARS DAILY. 100 strip 4  . Probiotic Product (ALIGN) 4 MG CAPS Take 4 mg by mouth daily.     . psyllium (METAMUCIL SMOOTH TEXTURE) 28 % packet Take 1 packet by mouth daily before breakfast.     . venlafaxine XR (EFFEXOR-XR) 75 MG 24 hr capsule Take 1 capsule (75 mg total) by mouth daily with breakfast. 90 capsule 2   No current facility-administered medications on file prior to visit.     PAST MEDICAL HISTORY: Past Medical History:  Diagnosis Date  . Allergy   . Anxiety   . Arthritis    back & knee  . Asthma     mild per pt  shows up with resp illness  . Chronic diastolic congestive heart failure ([place])   . Colon polyps   . Constipation   . Diabetes mellitus without complication ([place])   . Dyspnea   . Gastric polyps   . Gastroparesis   . GERD (gastroesophageal reflux disease)   . Headache    sinus headaches and migraines at times  . Heart murmur   . History of migraine headaches   . HTN (hypertension)   . Hyperlipidemia   . IBS (irritable bowel syndrome)   . Joint pain   . Lumbar disc disease   . S/P aortic valve replacement with bioprosthetic valve 08/23/2016   25 mm Edwards Intuity rapid-deployment bovine pericardial tissue valve via partial upper mini sternotomy  . Sleep apnea   . TIA (transient ischemic attack)     PAST SURGICAL HISTORY: Past Surgical History:  Procedure Laterality Date  . ABDOMINAL HYSTERECTOMY    . AORTIC VALVE REPLACEMENT N/A 08/23/2016   Procedure: AORTIC VALVE REPLACEMENT (AVR) - using partial Upper Sternotomy- 26mm Edwards Intuity Aortic Valve used;   Surgeon: Rexene Alberts, MD;  Location: [facility];  Service: Open Heart Surgery;  Laterality: N/A;  . BLADDER SUSPENSION  2007  . BREAST ENHANCEMENT SURGERY  1980  . CARPAL TUNNEL RELEASE Bilateral   . COLONOSCOPY    . DORSAL COMPARTMENT RELEASE Right 09/15/2014   Procedure: RIGHT WRIST DEQUERVAINS RELEASE ;  Surgeon: Kathryne Hitch, MD;  Location: [facility];  Service: Orthopedics;  Laterality: Right;  . ESOPHAGOGASTRODUODENOSCOPY    . KNEE ARTHROSCOPY  04/15/2012   Procedure: ARTHROSCOPY KNEE;  Surgeon: [name], MD;  Location: [facility];  Service: Orthopedics;  Laterality: Right;  . LAPAROSCOPIC CHOLECYSTECTOMY    . LASIK    . OOPHORECTOMY    . PALATE TO GINGIVA GRAFT  2017  . RIGHT/LEFT HEART CATH AND CORONARY ANGIOGRAPHY N/A 08/02/2016   Procedure: Right/Left Heart Cath and Coronary Angiography;  Surgeon: Sherren Mocha, MD;  Location: [facility];  Service: Cardiovascular;  Laterality: N/A;  . ROTATOR CUFF REPAIR Left   . TEE WITHOUT CARDIOVERSION N/A 08/23/2016   Procedure: TRANSESOPHAGEAL ECHOCARDIOGRAM (TEE);  Surgeon: Rexene Alberts, MD;  Location: [facility];  Service: Open Heart Surgery;  Laterality: N/A;  . TOTAL KNEE ARTHROPLASTY  04/15/2012   Procedure: TOTAL KNEE ARTHROPLASTY;  Surgeon: [name], MD;  Location: [facility];  Service: Orthopedics;  Laterality: Right;  . TRIGGER FINGER RELEASE Right 04/20/2015   Procedure: RIGHT TRIGGER FINGER RELEASE (TENDON SHEALTH INCISION) ;  Surgeon: [name], MD;  Location: [facility];  Service: Orthopedics;  Laterality: Right;    SOCIAL HISTORY: Social History   Tobacco Use  . Smoking status: Never Smoker  . Smokeless tobacco: Never Used  Substance Use Topics  . Alcohol use: No  . Drug use: No    FAMILY HISTORY: Family History  Problem Relation Age of Onset  . COPD Mother   . Colon polyps Mother   . Irritable bowel syndrome Mother   . Anxiety disorder Mother   . Heart disease Father     . Alcohol abuse Father   . Colon polyps Maternal Aunt   . Other Neg Hx        hyponatremia    ROS: Review of Systems  Constitutional: Negative for weight loss.  Cardiovascular: Negative for chest pain and claudication.  Musculoskeletal: Negative for myalgias.    PHYSICAL EXAM: Blood pressure (!) 130/52, pulse [name])  46, temperature 98.8 F (37.1 C), temperature source Oral, height 5\' 6"  (1.676 m), weight 198 lb (89.8 kg), SpO2 100 %. Body mass index is 31.96 kg/m. Physical Exam Vitals signs reviewed.  Constitutional:      Appearance: Normal appearance. She is obese.  Cardiovascular:     Rate and Rhythm: Normal rate.     Pulses: Normal pulses.  Pulmonary:     Effort: Pulmonary effort is normal.     Breath sounds: Normal breath sounds.  Musculoskeletal: Normal range of motion.  Skin:    General: Skin is warm and dry.  Neurological:     Mental Status: She is alert and oriented to person, place, and time.  Psychiatric:        Mood and Affect: Mood normal.        Behavior: Behavior normal.     RECENT LABS AND TESTS: BMET    Component Value Date/Time   NA 137 02/11/2019 1103   K 5.4 (H) 02/11/2019 1103   CL 99 02/11/2019 1103   CO2 23 02/11/2019 1103   GLUCOSE 124 (H) 02/11/2019 1103   GLUCOSE 86 01/20/2019 1543   BUN 18 02/11/2019 1103   CREATININE 0.69 02/11/2019 1103   CALCIUM 9.3 02/11/2019 1103   GFRNONAA 90 02/11/2019 1103   GFRAA 103 02/11/2019 1103   Lab Results  Component Value Date   HGBA1C 5.7 (A) 05/24/2019   HGBA1C 5.7 (A) 01/05/2019   HGBA1C 6.1 06/04/2018   HGBA1C 6.6 (H) 01/19/2018   HGBA1C 6.2 (A) 11/25/2017   Lab Results  Component Value Date   INSULIN 12.1 01/19/2018   CBC    Component Value Date/Time   WBC 9.2 01/05/2019 1028   RBC 4.44 01/05/2019 1028   HGB 11.6 (L) 01/05/2019 1028   HGB 11.7 01/19/2018 0959   HCT 35.7 (L) 01/05/2019 1028   HCT 35.7 01/19/2018 0959   PLT 292.0 01/05/2019 1028   PLT 358 04/24/2017 1231   MCV  80.5 01/05/2019 1028   MCV 81 01/19/2018 0959   MCH 26.5 (L) 01/19/2018 0959   MCH 27.4 08/26/2016 0248   MCHC 32.4 01/05/2019 1028   RDW 15.3 01/05/2019 1028   RDW 14.9 01/19/2018 0959   LYMPHSABS 2.9 01/19/2018 0959   MONOABS 0.6 05/11/2015 0836   EOSABS 0.1 01/19/2018 0959   BASOSABS 0.0 01/19/2018 0959   Iron/TIBC/Ferritin/ %Sat No results found for: IRON, TIBC, FERRITIN, IRONPCTSAT Lipid Panel     Component Value Date/Time   CHOL 122 02/11/2019 1103   TRIG 58 02/11/2019 1103   HDL 55 02/11/2019 1103   CHOLHDL 2 06/04/2018 1012   VLDL 14.0 06/04/2018 1012   LDLCALC 55 02/11/2019 1103   LDLDIRECT 58.0 11/25/2017 1156   Hepatic Function Panel     Component Value Date/Time   PROT 6.7 02/11/2019 1103   ALBUMIN 4.6 02/11/2019 1103   AST 26 02/11/2019 1103   ALT 16 02/11/2019 1103   ALKPHOS 151 (H) 02/11/2019 1103   BILITOT 0.3 02/11/2019 1103   BILIDIR 0.1 05/11/2015 0836      Component Value Date/Time   TSH 3.38 06/04/2018 1012   TSH 5.300 (H) 01/19/2018 0959   TSH 3.390 04/24/2017 1231      OBESITY BEHAVIORAL INTERVENTION VISIT  Today's visit was # 29   Starting weight: 220 lbs Starting date: 01/19/18 Today's weight : 198 lbs Today's date: 06/01/2019 Total lbs lost to date: 22 At least 15 minutes were spent on discussing the following behavioral intervention visit.  ASK: We discussed the diagnosis of obesity with [place]rell P Icenhour today and Trenise agreed to give [name] permission to discuss obesity behavioral modification therapy today.  ASSESS: Charliee has the diagnosis of obesity and her BMI today is 31.97 Doreatha is in the action stage of change   ADVISE: Sherronda was educated on the multiple health risks of obesity as well as the benefit of weight loss to improve her health. She was advised of the need for long term treatment and the importance of lifestyle modifications to improve her current health and to decrease her risk of future  health problems.  AGREE: Multiple dietary modification options and treatment options were discussed and  Jentry agreed to follow the recommendations documented in the above note.  ARRANGE: Lauraine was educated on the importance of frequent visits to treat obesity as outlined per CMS and USPSTF guidelines and agreed to schedule her next follow up appointment today.  Wilhemena Durie, am acting as transcriptionist for Briscoe Deutscher, DO  I have reviewed the above documentation for accuracy and completeness, and I agree with the above. Briscoe Deutscher, DO

## 2019-06-03 ENCOUNTER — Encounter (INDEPENDENT_AMBULATORY_CARE_PROVIDER_SITE_OTHER): Payer: Self-pay | Admitting: Family Medicine

## 2019-06-08 ENCOUNTER — Encounter: Payer: Self-pay | Admitting: Family Medicine

## 2019-06-08 ENCOUNTER — Other Ambulatory Visit: Payer: Self-pay

## 2019-06-08 ENCOUNTER — Ambulatory Visit (INDEPENDENT_AMBULATORY_CARE_PROVIDER_SITE_OTHER): Payer: Medicare Other | Admitting: Family Medicine

## 2019-06-08 VITALS — BP 134/60 | HR 95 | Temp 97.6°F | Ht 66.0 in | Wt 203.0 lb

## 2019-06-08 DIAGNOSIS — M85851 Other specified disorders of bone density and structure, right thigh: Secondary | ICD-10-CM

## 2019-06-08 DIAGNOSIS — M85852 Other specified disorders of bone density and structure, left thigh: Secondary | ICD-10-CM

## 2019-06-08 DIAGNOSIS — Z0000 Encounter for general adult medical examination without abnormal findings: Secondary | ICD-10-CM | POA: Diagnosis not present

## 2019-06-08 DIAGNOSIS — E1159 Type 2 diabetes mellitus with other circulatory complications: Secondary | ICD-10-CM

## 2019-06-08 DIAGNOSIS — E871 Hypo-osmolality and hyponatremia: Secondary | ICD-10-CM

## 2019-06-08 DIAGNOSIS — M5442 Lumbago with sciatica, left side: Secondary | ICD-10-CM

## 2019-06-08 DIAGNOSIS — E785 Hyperlipidemia, unspecified: Secondary | ICD-10-CM

## 2019-06-08 DIAGNOSIS — J301 Allergic rhinitis due to pollen: Secondary | ICD-10-CM

## 2019-06-08 DIAGNOSIS — E559 Vitamin D deficiency, unspecified: Secondary | ICD-10-CM | POA: Diagnosis not present

## 2019-06-08 DIAGNOSIS — I10 Essential (primary) hypertension: Secondary | ICD-10-CM

## 2019-06-08 DIAGNOSIS — K219 Gastro-esophageal reflux disease without esophagitis: Secondary | ICD-10-CM

## 2019-06-08 DIAGNOSIS — E119 Type 2 diabetes mellitus without complications: Secondary | ICD-10-CM

## 2019-06-08 DIAGNOSIS — G8929 Other chronic pain: Secondary | ICD-10-CM

## 2019-06-08 DIAGNOSIS — R748 Abnormal levels of other serum enzymes: Secondary | ICD-10-CM | POA: Diagnosis not present

## 2019-06-08 DIAGNOSIS — M79671 Pain in right foot: Secondary | ICD-10-CM | POA: Diagnosis not present

## 2019-06-08 DIAGNOSIS — F411 Generalized anxiety disorder: Secondary | ICD-10-CM

## 2019-06-08 DIAGNOSIS — I152 Hypertension secondary to endocrine disorders: Secondary | ICD-10-CM

## 2019-06-08 DIAGNOSIS — Z79899 Other long term (current) drug therapy: Secondary | ICD-10-CM

## 2019-06-08 DIAGNOSIS — E1169 Type 2 diabetes mellitus with other specified complication: Secondary | ICD-10-CM

## 2019-06-08 DIAGNOSIS — J4520 Mild intermittent asthma, uncomplicated: Secondary | ICD-10-CM

## 2019-06-08 LAB — CBC WITH DIFFERENTIAL/PLATELET
Basophils Absolute: 0.1 10*3/uL (ref 0.0–0.1)
Basophils Relative: 0.8 % (ref 0.0–3.0)
Eosinophils Absolute: 0.1 10*3/uL (ref 0.0–0.7)
Eosinophils Relative: 1.3 % (ref 0.0–5.0)
HCT: 37.9 % (ref 36.0–46.0)
Hemoglobin: 12.4 g/dL (ref 12.0–15.0)
Lymphocytes Relative: 35.2 % (ref 12.0–46.0)
Lymphs Abs: 2.8 10*3/uL (ref 0.7–4.0)
MCHC: 32.7 g/dL (ref 30.0–36.0)
MCV: 85.3 fl (ref 78.0–100.0)
Monocytes Absolute: 0.8 10*3/uL (ref 0.1–1.0)
Monocytes Relative: 9.6 % (ref 3.0–12.0)
Neutro Abs: 4.2 10*3/uL (ref 1.4–7.7)
Neutrophils Relative %: 53.1 % (ref 43.0–77.0)
Platelets: 270 10*3/uL (ref 150.0–400.0)
RBC: 4.44 Mil/uL (ref 3.87–5.11)
RDW: 14.8 % (ref 11.5–15.5)
WBC: 8 10*3/uL (ref 4.0–10.5)

## 2019-06-08 LAB — LIPID PANEL
Cholesterol: 131 mg/dL (ref 0–200)
HDL: 53.9 mg/dL (ref >39.00)
LDL Cholesterol: 64 mg/dL (ref 0–99)
NonHDL: 76.98
Total CHOL/HDL Ratio: 2
Triglycerides: 65 mg/dL (ref 0.0–149.0)
VLDL: 13 mg/dL (ref 0.0–40.0)

## 2019-06-08 LAB — COMPREHENSIVE METABOLIC PANEL
ALT: 14 U/L (ref 0–35)
AST: 19 U/L (ref 0–37)
Albumin: 4.2 g/dL (ref 3.5–5.2)
Alkaline Phosphatase: 155 U/L — ABNORMAL HIGH (ref 39–117)
BUN: 22 mg/dL (ref 6–23)
CO2: 27 mEq/L (ref 19–32)
Calcium: 9.1 mg/dL (ref 8.4–10.5)
Chloride: 99 mEq/L (ref 96–112)
Creatinine, Ser: 0.63 mg/dL (ref 0.40–1.20)
GFR: 93.82 mL/min (ref >60.00)
Glucose, Bld: 123 mg/dL — ABNORMAL HIGH (ref 70–99)
Potassium: 4.6 mEq/L (ref 3.5–5.1)
Sodium: 133 mEq/L — ABNORMAL LOW (ref 135–145)
Total Bilirubin: 0.4 mg/dL (ref 0.2–1.2)
Total Protein: 6.2 g/dL (ref 6.0–8.3)

## 2019-06-08 LAB — VITAMIN B12: Vitamin B-12: 959 pg/mL — ABNORMAL HIGH (ref 211–911)

## 2019-06-08 LAB — VITAMIN D 25 HYDROXY (VIT D DEFICIENCY, FRACTURES): VITD: 52.21 ng/mL (ref 30.00–100.00)

## 2019-06-08 LAB — GAMMA GT: GGT: 14 U/L (ref 7–51)

## 2019-06-08 LAB — HEMOGLOBIN A1C: Hgb A1c MFr Bld: 6.1 % (ref 4.6–6.5)

## 2019-06-08 LAB — URIC ACID: Uric Acid, Serum: 4.5 mg/dL (ref 2.4–7.0)

## 2019-06-08 MED ORDER — CLOPIDOGREL BISULFATE 75 MG PO TABS: 75.0000 mg | ORAL_TABLET | Freq: Every day | ORAL | 3 refills | Status: DC

## 2019-06-08 NOTE — Progress Notes (Addendum)
Phone: [phone]   Subjective:  Patient presents today for their annual physical. Chief complaint-noted.   See problem oriented charting- Review of Systems  Constitutional: Negative.   HENT: Positive for hearing loss.        Has not been evaluated not bad at this time   Eyes: Negative.   Respiratory: Negative.   Cardiovascular: Negative.   Gastrointestinal: Positive for heartburn.       Current medications working   Genitourinary: Negative.   Musculoskeletal: Positive for joint pain.       Right toe pain followed by ortho   Skin: Negative.   Neurological: Negative.   Endo/Heme/Allergies: Negative.   Psychiatric/Behavioral: Negative.    The following were reviewed and entered/updated in epic: Past Medical History:  Diagnosis Date  . Allergy   . Anxiety   . Arthritis    back & knee  . Asthma     mild per pt shows up with resp illness  . Chronic diastolic congestive heart failure ([place])   . Colon polyps   . Constipation   . Diabetes mellitus without complication ([place])   . Dyspnea   . Gastric polyps   . Gastroparesis   . GERD (gastroesophageal reflux disease)   . Headache    sinus headaches and migraines at times  . Heart murmur   . History of migraine headaches   . HTN (hypertension)   . Hyperlipidemia   . IBS (irritable bowel syndrome)   . Joint pain   . Lumbar disc disease   . S/P aortic valve replacement with bioprosthetic valve 08/23/2016   25 mm Edwards Intuity rapid-deployment bovine pericardial tissue valve via partial upper mini sternotomy  . Sleep apnea   . TIA (transient ischemic attack)    Patient Active Problem List   Diagnosis Date Noted  . History of CVA in adulthood 08/13/2017    Priority: High  . S/P minimally invasive aortic valve replacement with bioprosthetic valve 08/23/2016    Priority: High  . Diastolic dysfunction     Priority: High  . Diabetes mellitus type 2, controlled ([place]) 05/12/2014    Priority: High  . Vitamin D deficiency  06/04/2018    Priority: Medium  . Hyponatremia 02/17/2018    Priority: Medium  . Hypertension associated with diabetes ([place]) 01/02/2017    Priority: Medium  . Osteopenia 05/27/2016    Priority: Medium  . OSA (obstructive sleep apnea) 02/27/2016    Priority: Medium  . Elevated alkaline phosphatase level 05/16/2015    Priority: Medium  . GERD (gastroesophageal reflux disease) 03/10/2014    Priority: Medium  . Generalized anxiety disorder 03/10/2014    Priority: Medium  . Migraines 03/10/2014    Priority: Medium  . Irritable bowel syndrome 12/15/2008    Priority: Medium  . Hyperlipidemia associated with type 2 diabetes mellitus ([place]) 02/09/2008    Priority: Medium  . Blurry vision 08/13/2017    Priority: Low  . Bruxism 10/23/2015    Priority: Low  . Atypical chest pain 11/10/2014    Priority: Low  . Acne 05/12/2014    Priority: Low  . EUSTACHIAN TUBE DYSFUNCTION, CHRONIC 06/14/2010    Priority: Low  . Low back pain 08/11/2008    Priority: Low  . Allergic rhinitis 02/12/2007    Priority: Low  . Asthma 02/12/2007    Priority: Low   Past Surgical History:  Procedure Laterality Date  . ABDOMINAL HYSTERECTOMY    . AORTIC VALVE REPLACEMENT N/A 08/23/2016   Procedure: AORTIC VALVE REPLACEMENT (  AVR) - using partial Upper Sternotomy- 84m Edwards Intuity Aortic Valve used;  Surgeon: CRexene Alberts MD;  [facility]  Service: Open Heart Surgery;  Laterality: N/A;  . BLADDER SUSPENSION  2007  . BREAST ENHANCEMENT SURGERY  1980  . CARPAL TUNNEL RELEASE Bilateral   . COLONOSCOPY    . DORSAL COMPARTMENT RELEASE Right 09/15/2014   Procedure: RIGHT WRIST DEQUERVAINS RELEASE ;  Surgeon: DKathryne Hitch MD;  [facility]  Service: Orthopedics;  Laterality: Right;  . ESOPHAGOGASTRODUODENOSCOPY    . KNEE ARTHROSCOPY  04/15/2012   Procedure: ARTHROSCOPY KNEE;  Surgeon: DNinetta Lights MD;  [place] [facility]  Service: Orthopedics;  Laterality: Right;  .  LAPAROSCOPIC CHOLECYSTECTOMY    . LASIK    . OOPHORECTOMY    . PALATE TO GINGIVA GRAFT  2017  . RIGHT/LEFT HEART CATH AND CORONARY ANGIOGRAPHY N/A 08/02/2016   Procedure: Right/Left Heart Cath and Coronary Angiography;  Surgeon: MSherren Mocha MD;  [facility];  Service: Cardiovascular;  Laterality: N/A;  . ROTATOR CUFF REPAIR Left   . TEE WITHOUT CARDIOVERSION N/A 08/23/2016   Procedure: TRANSESOPHAGEAL ECHOCARDIOGRAM (TEE);  Surgeon: CRexene Alberts MD;  [place] [facility]  Service: Open Heart Surgery;  Laterality: N/A;  . TOTAL KNEE ARTHROPLASTY  04/15/2012   Procedure: TOTAL KNEE ARTHROPLASTY;  Surgeon: DNinetta Lights MD;  [place] [facility]  Service: Orthopedics;  Laterality: Right;  . TRIGGER FINGER RELEASE Right 04/20/2015   Procedure: RIGHT TRIGGER FINGER RELEASE (TENDON SHEALTH INCISION) ;  Surgeon: DNinetta Lights MD;  [place] [facility]  Service: Orthopedics;  Laterality: Right;    Family History  Problem Relation Age of Onset  . COPD Mother   . Colon polyps Mother   . Irritable bowel syndrome Mother   . Anxiety disorder Mother   . Heart disease Father   . Alcohol abuse Father   . Colon polyps Maternal Aunt   . Other Neg Hx        hyponatremia    Medications- reviewed and updated Current Outpatient Medications  Medication Sig Dispense Refill  . aspirin EC 81 MG tablet Take 1 tablet (81 mg total) by mouth daily. 90 tablet 3  . atorvastatin (LIPITOR) 40 MG tablet TAKE 1 TABLET (40 MG TOTAL) BY MOUTH DAILY AT 6 PM. 90 tablet 3  . Biotin 5000 MCG TABS Take 5,000 mcg by mouth daily.     . calcium-vitamin D (OSCAL WITH D) 500-200 MG-UNIT per tablet Take 2 tablets by mouth daily.     . Cholecalciferol (VITAMIN D) 125 MCG (5000 UT) CAPS Take 5,000 Units by mouth daily.    . clopidogrel (PLAVIX) 75 MG tablet clopidogrel 75 mg tablet    . demeclocycline (DECLOMYCIN) 300 MG tablet     . ergocalciferol (VITAMIN D2) 1.25 MG (50000 UT) capsule  ergocalciferol (vitamin D2) 1,250 mcg (50,000 unit) capsule    . fluticasone (FLONASE) 50 MCG/ACT nasal spray Place 2 sprays into both nostrils daily.     .Marland Kitchenipratropium (ATROVENT) 0.03 % nasal spray 2 SPRAYS IN EACH NOSTRIL AS NEEDED THREE TIMES AS NEEDED    . Lancets (ONETOUCH DELICA PLUS [id number] MISC USE TO TEST BLOOD SUGARS DAILY. DX: E11.9 100 each 12  . loratadine (CLARITIN) 10 MG tablet Take 10 mg by mouth at bedtime.     . Melatonin 5 MG TABS Take by mouth.    . metFORMIN (GLUCOPHAGE) 500 MG tablet Take 500 mg by  mouth 2 (two) times daily with a meal. 1tab at breakfast and1/2 at dinner    . metoCLOPramide (REGLAN) 10 MG tablet TAKE 1/2 TABLET BY MOUTH 4 TIMES A DAY 60 tablet 5  . metoprolol tartrate (LOPRESSOR) 25 MG tablet Take 0.5 tablets (12.5 mg total) by mouth 2 (two) times daily. 45 tablet 3  . Multiple Vitamin (MULTIVITAMIN WITH MINERALS) TABS tablet Take 1 tablet by mouth daily.    . naproxen sodium (ALEVE) 220 MG tablet Take 220 mg by mouth.    [name] omeprazole (PRILOSEC OTC) 20 MG tablet Take 40 mg by mouth daily with breakfast.    . omeprazole (PRILOSEC) 20 MG capsule omeprazole magnesium 20 mg capsule,delayed release    . ONETOUCH VERIO test strip USE TO TEST BLOOD SUGARS DAILY. 100 strip 4  . Probiotic Product (ALIGN) 4 MG CAPS Take 4 mg by mouth daily.     . psyllium (METAMUCIL SMOOTH TEXTURE) 28 % packet Take 1 packet by mouth daily before breakfast.     . venlafaxine XR (EFFEXOR-XR) 75 MG 24 hr capsule Take 1 capsule (75 mg total) by mouth daily with breakfast. 90 capsule 2   No current facility-administered medications for this visit.    Allergies-reviewed and updated Allergies  Allergen Reactions  . Augmentin [Amoxicillin-Pot Clavulanate] Rash    rash  . Erythromycin Nausea Only  . Penicillins Itching and Rash    Has patient had a PCN reaction causing immediate rash, facial/tongue/throat swelling, SOB or lightheadedness with hypotension: No Has patient had a PCN  reaction causing severe rash involving mucus membranes or skin necrosis: No Has patient had a PCN reaction that required hospitalization: No Has patient had a PCN reaction occurring within the last 10 years: No  If all of the above answers are "NO", then may proceed with Cephalosporin use.     Social History   Social History Narrative   She lives with husband (1978) and two dogs. Step-son [name] (1971-psychiatrist in [place]). No grandkids. 2 dogs-sheltie/collie mix and border collie mix      Highest level of education:  Master in education   She is retired Animal nutritionist x 32 years.      Hobbies: time with dogs            Objective  Objective:  BP 134/60   Pulse 95   Temp 97.6 F (36.4 C) (Temporal)   Ht 5' 6"  (1.676 m)   Wt 203 lb (92.1 kg)   LMP  (LMP Unknown)   SpO2 95%   BMI 32.77 kg/m  Gen: NAD, resting comfortably HEENT: Mucous membranes are moist. Oropharynx normal Neck: no thyromegaly CV: RRR no murmurs rubs or gallops Lungs: CTAB no crackles, wheeze, rhonchi Abdomen: soft/nontender/nondistended/normal bowel sounds. No rebound or guarding.  Ext: no edema Skin: warm, dry Neuro: grossly normal, moves all extremities, PERRLA   Assessment and Plan   68 y.o. female presenting for annual physical.  Health Maintenance counseling: 1. Anticipatory guidance: Patient counseled regarding regular dental exams q6 months, eye exams Yearly,  avoiding smoking and second hand smoke , limiting alcohol to 1 beverage per day . Does not drink often.    2. Risk factor reduction:  Advised patient of need for regular exercise and diet rich and fruits and vegetables to reduce risk of heart attack and stroke. Exercise- due to ongoing acute medical conditions she has not been able to exercise recently. But is member of gyn and exercising there three days a week for  60 minutes or more normally.  . Diet- is followed by [facility]. She has been following their diet.  She has lost 30 lbs in the last year and goal of going down between 26-30 more to be at goal weight. .  Wt Readings from Last 3 Encounters:  06/08/19 203 lb (92.1 kg)  06/01/19 198 lb (89.8 kg)  05/28/19 203 lb 12.8 oz (92.4 kg)  3. Immunizations/screenings/ancillary studies- would be interested in covid 19 vaccine Immunization History  Administered Date(s) Administered  . Influenza Split 04/04/2011, 03/05/2012  . Influenza Whole 04/16/2007, 03/16/2009, 03/13/2010  . Influenza, High Dose Seasonal PF 01/31/2019  . Influenza,inj,Quad PF,6+ Mos 03/30/2013, 03/10/2014, 03/23/2015, 02/27/2016  . Influenza-Unspecified 02/27/2017, 02/28/2017, 01/31/2018  . Pneumococcal Conjugate-13 05/21/2016  . Pneumococcal Polysaccharide-23 05/23/2017  . Td 06/24/1996, 08/06/2007, 11/07/2017  . Tdap 11/07/2017  . Zoster 05/12/2014  . Zoster Recombinat (Shingrix) 01/21/2019, 03/24/2019  4. Cervical cancer screening- Followed by GYN Dr. Ouida Sills - no abnormal pap history. Past age based required screening.  5. Breast cancer screening-  breast exam monthly  and mammogram had to cancel due to illness but has plans to reschedule soon. Number provided on AVS   6. Colon cancer screening - up to date due 2026 Dr. [name]- last 2016. Polyps in past but not most recently.  7. Skin cancer screening- Followed by dermatology (Dr. Elvera Lennox) advised regular sunscreen use. Denies worrisome, changing, or new skin lesions.  8. Birth control/STD check- Not needed at this time . Not needed at this time.  9. Osteoporosis screening at 78-  Last done 05/2018-osteopenia noted- had actually improved so 3 year repeat would be reasonable so possible 2022  - Never smoker  Status of chronic or acute concerns   #Right second toe pain December 2020-recommended visit with Dr. Percell Miller- was told inflamed metatarsal- we will check a uric acid just to make sure not gout but suspicion not high   #History of stroke-remains on Plavix long-term as  well as aspirin 81 mg. Discussed with Dr. Meda Coffee and Dr. Tomi Likens and they are ok stopping aspirin /asa while continuing plavix- will have staff inform patient.    %s/p aortic valve replacement with bioprosthetic valve- requires dental prophylaxis. Follows with Dr. Meda Coffee. Catheterization 08/02/16 with minor nonobstructive CAD.    % OSA- remains on CPAP with Dr. Halford Chessman   %obesity- working hard with weigh to wellness program . Slight setback with bronchitis  # Diabetes S: compliant with metformin 543m BID in the past- now down to 500 AM, 250 PM.   CBGs- 127 this AM, usually 120s.  Exercise and diet- due to ongoing acute medical conditions she has not been able to exercise recently. But is member of gym and exercising there three days a week for 60 minutes or more normally  Lab Results  Component Value Date   HGBA1C 5.7 (A) 05/24/2019   HGBA1C 5.7 (A) 01/05/2019   HGBA1C 6.1 06/04/2018  A/P: hopefully remains controlled- update a1c with labs - she would like to reduce metformin- we discussed if a1c is under 6 still- reducing dose by 253mper day whether she wants to cut out half pill in evening or take half twice a day  #hypertension/diastolic dysfunction (prior listed as CHF but no chronic lasix need and no hospitalization for HF) S: compliant with metoprolol 12.22m23mID. In past on lasix  BP Readings from Last 3 Encounters:  06/08/19 134/60  06/01/19 (!) 130/52  05/28/19 122/60   A/P:  Stable. Continue  current medications.    #hyperlipidemia/history of stroke S: for lipids compliant with  atorvastatin 15m.   A/P: Hopefully stable-update lipid panel today. Ideally LDL would be under 70  # generalized anxiety S: patient is compliant with venlafaxine 738mXR.  We are using lower dose as had more hyponatremia in the past- possible SIADH.  A/P: Reasonable control-continue current medication   # Migraines - follows with Dr. JaTomi Likens:patient with history of stroke- sumatriptan was stopped  due to stroke history. Venlafaxine mentioned for migraine prophylaxis. PRN treatment with ibuprofen or execrin with reglan per Dr. JaTomi Likensthought lower risk than sumatriptan.  Recently,doing very well A/P: glad she has been doing well- has follow up in January   # Asthma/allergic rhinitis S: doing well  recently with prn albuterol only. Compliant with claritin, astelin, nasonex, saline. 2020 stopped allergy shots A/P: doing well- continue allergy follow up    # Low back pain/degenerative disc disease S: intermittent issues- has been better with weight loss. Should avoid nsaids with plavix if possible- better lately with her exercises  A/P: doing well- continue current meds   # GERD S:Dr. PeHenrene Pastoras advised higher dose omeprazole at 4031mer patient so she wants to continue this dose- compliant    No results found for: VITAMINB12  A/P: doing reasonably well- continue curent meds   #History of low vitamin D-currently taking 5000 units per day.  We will update vitamin D with labs today.  #Elevated alkaline phosphatase-we will check GGT level today Hepatic Function Latest Ref Rng & Units 02/11/2019 01/20/2019 01/05/2019  Total Protein 6.0 - 8.5 g/dL 6.7 6.3 6.3  Albumin 3.8 - 4.8 g/dL 4.6 - 4.4  AST 0 - 40 IU/L 26 - 22  ALT 0 - 32 IU/L 16 - 14  Alk Phosphatase 39 - 117 IU/L 151(H) - 128(H)  Total Bilirubin 0.0 - 1.2 mg/dL 0.3 - 0.4  Bilirubin, Direct 0.0 - 0.3 mg/dL - - -    #Morbid obesity-removed from list as no longer valid-BMI now 32   Recommended follow up: Return in about 6 months (around 12/07/2019) for follow up- or sooner if needed. Future Appointments  Date Time Provider DepCalhoun2/21/2020  3:05 PM [facility]  06/15/2019 10:40 AM WalBriscoe DeutscherO MWM-MWM None  07/20/2019  1:30 PM JafPieter PartridgeO LBN-LBNG None  09/28/2019  3:00 PM EllRenato ShinD LBPC-LBENDO None  12/09/2019  8:40 AM HunMarin OlpD LBPC-HPC PEC  12/31/2019 11:00  AM Parrett, TamFonnie MuP LBPU-PULCARE None   Lab/Order associations: fasting   ICD-10-CM   1. Preventative health care  Z00.00 CBC with Differential/Platelet    Comprehensive metabolic panel    Lipid panel    Hemoglobin A1c    Vitamin B12    Vitamin D  (25 hydroxy )    Gamma GT    Uric Acid  2. Controlled type 2 diabetes mellitus without complication, without long-term current use of insulin (HCC)  E11.9 CBC with Differential/Platelet    Comprehensive metabolic panel    Lipid panel    Hemoglobin A1c  3. Vitamin D deficiency  E55.9 Vitamin D  (25 hydroxy )  4. Osteopenia of both thighs  M85.851    M85.852   5. Hyponatremia  E87.1   6. Hypertension associated with diabetes (HCCDownsE11.59    I10   7. Hyperlipidemia associated with type 2 diabetes mellitus (HCC)  E11.69    E78.5   8.  Gastroesophageal reflux disease without esophagitis  K21.9   9. Generalized anxiety disorder  F41.1   10. Mild intermittent asthma without complication  P97.87   11. Allergic rhinitis due to pollen, unspecified seasonality  J30.1   12. Chronic low back pain with left-sided sciatica, unspecified back pain laterality  M54.42    G89.29   13. Right foot pain  M79.671 Uric Acid  14. Elevated alkaline phosphatase level  R74.8 Gamma GT  15. High risk medication use  Z79.899 Vitamin B12    No orders of the defined types were placed in this encounter.   Return precautions advised.  Garret Reddish, MD

## 2019-06-08 NOTE — Addendum Note (Signed)
Addended by: [name] on: 06/08/2019 10:27 AM   Modules accepted: Orders

## 2019-06-08 NOTE — Progress Notes (Signed)
Pt called and notified

## 2019-06-08 NOTE — Patient Instructions (Addendum)
Health Maintenance Due  Topic Date Due  . MAMMOGRAM Patient will call number provided had to change due to cough.   [facility]    [address] [place], [place] 03/20/2019   - she would like to reduce metformin- we discussed if a1c is under 6 still- reducing dose by 250mg  per day whether she wants to cut out half pill in evening or take half twice a day  Please stop by lab before you go If you do not have mychart- we will call you about results within 5 business days of [name] receiving them.  If you have mychart- we will send your results within 3 business days of [name] receiving them.  If abnormal or we want to clarify a result, we will call or mychart you to make sure you receive the message.  If you have questions or concerns or don't hear within 5-7 days, please send [name] a message or call us.   Recommended follow up: Return in about 6 months (around 12/07/2019) for follow up- or sooner if needed.

## 2019-06-09 ENCOUNTER — Encounter: Payer: Self-pay | Admitting: Family Medicine

## 2019-06-09 DIAGNOSIS — R748 Abnormal levels of other serum enzymes: Secondary | ICD-10-CM

## 2019-06-14 ENCOUNTER — Other Ambulatory Visit: Payer: Self-pay

## 2019-06-14 ENCOUNTER — Ambulatory Visit (HOSPITAL_COMMUNITY): Payer: Medicare Other | Attending: Cardiovascular Disease

## 2019-06-14 DIAGNOSIS — Z953 Presence of xenogenic heart valve: Secondary | ICD-10-CM | POA: Insufficient documentation

## 2019-06-14 DIAGNOSIS — E782 Mixed hyperlipidemia: Secondary | ICD-10-CM | POA: Diagnosis present

## 2019-06-14 DIAGNOSIS — I10 Essential (primary) hypertension: Secondary | ICD-10-CM | POA: Diagnosis present

## 2019-06-15 ENCOUNTER — Encounter (INDEPENDENT_AMBULATORY_CARE_PROVIDER_SITE_OTHER): Payer: Self-pay | Admitting: Family Medicine

## 2019-06-15 ENCOUNTER — Other Ambulatory Visit: Payer: Self-pay | Admitting: Family Medicine

## 2019-06-15 ENCOUNTER — Encounter: Payer: Self-pay | Admitting: Endocrinology

## 2019-06-15 ENCOUNTER — Ambulatory Visit (INDEPENDENT_AMBULATORY_CARE_PROVIDER_SITE_OTHER): Payer: Medicare Other | Admitting: Family Medicine

## 2019-06-15 VITALS — BP 155/63 | HR 48 | Temp 98.3°F | Ht 66.0 in | Wt 198.0 lb

## 2019-06-15 DIAGNOSIS — E7849 Other hyperlipidemia: Secondary | ICD-10-CM

## 2019-06-15 DIAGNOSIS — E119 Type 2 diabetes mellitus without complications: Secondary | ICD-10-CM | POA: Diagnosis not present

## 2019-06-15 DIAGNOSIS — Z8673 Personal history of transient ischemic attack (TIA), and cerebral infarction without residual deficits: Secondary | ICD-10-CM

## 2019-06-15 DIAGNOSIS — I10 Essential (primary) hypertension: Secondary | ICD-10-CM

## 2019-06-15 DIAGNOSIS — E669 Obesity, unspecified: Secondary | ICD-10-CM

## 2019-06-15 DIAGNOSIS — G4733 Obstructive sleep apnea (adult) (pediatric): Secondary | ICD-10-CM | POA: Diagnosis not present

## 2019-06-15 DIAGNOSIS — Z953 Presence of xenogenic heart valve: Secondary | ICD-10-CM

## 2019-06-15 DIAGNOSIS — Z6832 Body mass index (BMI) 32.0-32.9, adult: Secondary | ICD-10-CM

## 2019-06-15 DIAGNOSIS — Z9989 Dependence on other enabling machines and devices: Secondary | ICD-10-CM

## 2019-06-16 ENCOUNTER — Encounter: Payer: Self-pay | Admitting: Family Medicine

## 2019-06-21 ENCOUNTER — Telehealth: Payer: Self-pay | Admitting: Cardiology

## 2019-06-21 NOTE — Telephone Encounter (Signed)
Pt has been notified of echo results by phone with verbal understanding. Pt thanked me for the good news and the call today. The patient has been notified of the result and verbalized understanding. All questions (if any) were answered. [name], [facility] 06/21/2019 11:36 AM

## 2019-06-21 NOTE — Telephone Encounter (Signed)
    Please return call with echo results

## 2019-06-21 NOTE — Telephone Encounter (Signed)
Pt has been notified of echo results by phone with verbal understanding. Pt thanked me for the good news and the call today. The patient has been notified of the result and verbalized understanding.  All questions (if any) were answered. Julaine Hua, [facility] 06/21/2019 11:36 AM

## 2019-06-22 ENCOUNTER — Encounter (INDEPENDENT_AMBULATORY_CARE_PROVIDER_SITE_OTHER): Payer: Self-pay | Admitting: Family Medicine

## 2019-06-22 NOTE — Progress Notes (Signed)
Chief Complaint: OBESITY [name] is here to discuss her progress with her obesity treatment plan. [name] is on the Category 3 plan and states she is following her eating plan approximately 90 % of the time. [name] states she is exercising 0 minutes 0 times per week.  Today's visit was # 30  Starting weight: 220 lbs Starting date: 01/19/18 Today's weight : 198 lbs Today's date: 06/15/2019 Total lbs lost to date: 2 Total lbs lost since last in-office visit: 0  Subjective:   Interim History: Ethelean enjoyed her celebration and holiday eating. She is not exercising due to right metatarsalgia. She is followed by Dr. Percell Miller. She now has inserts and is doing at home physical therapy.  1. Type 2 diabetes mellitus without complication, without long-term current use of insulin ([place]) [name] is stable on metformin. She states her morning BGs range between 119 and 133.  Lab Results  Component Value Date   HGBA1C 6.1 06/08/2019   2. Essential hypertension [name] has hypertension associated with diabetes mellitus II. Her blood pressure is elevated, but she felt that it was a cuff issue. She denies chest pain, shortness of breath, or edema.  BP Readings from Last 3 Encounters:  06/15/19 (!) 155/63  06/08/19 134/60  06/01/19 (!) 130/52   3. Other hyperlipidemia [name] has hyperlipidemia associated with diabetes mellitus II.   Lab Results  Component Value Date   CHOL 131 06/08/2019   HDL 53.90 06/08/2019   LDLCALC 64 06/08/2019   LDLDIRECT 58.0 11/25/2017   TRIG 65.0 06/08/2019   CHOLHDL 2 06/08/2019   Lab Results  Component Value Date   ALT 14 06/08/2019   AST 19 06/08/2019   ALKPHOS 155 (H) 06/08/2019   BILITOT 0.4 06/08/2019   The ASCVD Risk score Hayley Bussing DC Jr., et al., 2013) failed to calculate for the following reasons:   The patient has a prior MI or stroke diagnosis.  4. OSA on CPAP [name] has obstructive sleep  apnea, and she is compliant with her CPAP.  5. S/P minimally invasive aortic valve replacement with bioprosthetic valve [name] has had a replacement valve.  6. History of CVA in adulthood [name] was on Plavix recently, but stopped.  Reviewed and updated this visit by clinician and staff: allergies, medications, problem list, medical history, surgical history, family history, social history and previous encounter notes.  General review of systems is unchanged or negative.   Assessment:   1. Type 2 diabetes mellitus without complication, without long-term current use of insulin ([place])   2. Essential hypertension   3. Other hyperlipidemia   4. OSA on CPAP   5. S/P minimally invasive aortic valve replacement with bioprosthetic valve   6. History of CVA in adulthood   7. Class 1 obesity with serious comorbidity and body mass index (BMI) of 32.0 to 32.9 in adult, unspecified obesity type    Plan:   1. Type 2 diabetes mellitus without complication, without long-term current use of insulin ([place]) [name] has been given diabetes education by myself today. Good blood sugar control is important to decrease the likelihood of diabetic complications such as nephropathy, neuropathy, limb loss, blindness, coronary artery disease, and death. Intensive lifestyle modification including diet, exercise and weight loss were discussed as the first line treatment for diabetes. We will continue to monitor.  2. Essential hypertension [name] is working on healthy weight loss and exercise to improve blood pressure control. We will watch for signs  of hypotension as she continues her lifestyle modifications. We will continue to monitor.  3. Other hyperlipidemia Intensive lifestyle modifications as the first line treatment for hyperlipidemia. We discussed many lifestyle modifications today and [name] will continue to work on diet, exercise and weight loss efforts. We will continue to  monitor.  4. OSA on CPAP We will continue to monitor.  5. S/P minimally invasive aortic valve replacement with bioprosthetic valve Echocardiogram from yesterday was reviewed with the patient. Unchanged from previous.  6. History of CVA in adulthood We will continue to monitor.  7. Class 1 obesity with serious comorbidity and body mass index (BMI) of 32.0 to 32.9 in adult, unspecified obesity type [name] is currently in the action stage of change. As such, her goal is to continue with weight loss efforts. She has agreed to follow the Category 3 plan. The exercise goal is to eventually work up to 150 minutes of combined cardio and strengthening exercise per week for weight loss and overall health benefits. We discussed the following Behavioral Modification Strategies today: decreasing simple carbohydrates , increasing water intake and holiday eating strategies .  [name] has agreed to follow-up with our clinic in 2 to 3 weeks. She was informed of the importance of frequent follow-up visits to maximize her success with intensive lifestyle modifications for her multiple health conditions.  Objective:   Blood pressure (!) 155/63, pulse (!) 48, temperature 98.3 F (36.8 C), temperature source Oral, height 5\' 6"  (1.676 m), weight 198 lb (89.8 kg), SpO2 98 %. Body mass index is 31.96 kg/m.  General: Cooperative, alert, well developed, in no acute distress. HEENT: Conjunctivae and lids unremarkable. Neck: No thyromegaly.  Cardiovascular: Regular rhythm.  Lungs: Normal work of breathing. Extremities: No edema.  Neurologic: No focal deficits.      Component Value Date/Time   WBC 8.0 06/08/2019 0949   RBC 4.44 06/08/2019 0949   HGB 12.4 06/08/2019 0949   HGB 11.7 01/19/2018 0959   HCT 37.9 06/08/2019 0949   HCT 35.7 01/19/2018 0959   PLT 270.0 06/08/2019 0949   PLT 358 04/24/2017 1231   MCV 85.3 06/08/2019 0949   MCV 81 01/19/2018 0959   MCH 26.5 (L) 01/19/2018 0959    MCH 27.4 08/26/2016 0248   MCHC 32.7 06/08/2019 0949   RDW 14.8 06/08/2019 0949   RDW 14.9 01/19/2018 0959   LYMPHSABS 2.8 06/08/2019 0949   LYMPHSABS 2.9 01/19/2018 0959   MONOABS 0.8 06/08/2019 0949   EOSABS 0.1 06/08/2019 0949   EOSABS 0.1 01/19/2018 0959   BASOSABS 0.1 06/08/2019 0949   BASOSABS 0.0 01/19/2018 0959   Lab Results  Component Value Date   ALT 14 06/08/2019   AST 19 06/08/2019   ALKPHOS 155 (H) 06/08/2019   BILITOT 0.4 06/08/2019   Lab Results  Component Value Date   CREATININE 0.63 06/08/2019   BUN 22 06/08/2019   NA 133 (L) 06/08/2019   K 4.6 06/08/2019   CL 99 06/08/2019   CO2 27 06/08/2019   Lab Results  Component Value Date   HGBA1C 6.1 06/08/2019   HGBA1C 5.7 (A) 05/24/2019   HGBA1C 5.7 (A) 01/05/2019   HGBA1C 6.1 06/04/2018   HGBA1C 6.6 (H) 01/19/2018   Lab Results  Component Value Date   INSULIN 12.1 01/19/2018   Lab Results  Component Value Date   HGBA1C 6.1 06/08/2019   Lab Results  Component Value Date   TSH 3.38 06/04/2018   Lab Results  Component Value Date  CHOL 131 06/08/2019   HDL 53.90 06/08/2019   LDLCALC 64 06/08/2019   LDLDIRECT 58.0 11/25/2017   TRIG 65.0 06/08/2019   CHOLHDL 2 06/08/2019   Attestation Statements:   Wilhemena Durie, am acting as transcriptionist for Briscoe Deutscher, DO.  I have reviewed the above documentation for accuracy and completeness, and I agree with the above. Briscoe Deutscher, DO

## 2019-06-30 DIAGNOSIS — M25571 Pain in right ankle and joints of right foot: Secondary | ICD-10-CM | POA: Diagnosis not present

## 2019-07-06 DIAGNOSIS — R921 Mammographic calcification found on diagnostic imaging of breast: Secondary | ICD-10-CM | POA: Diagnosis not present

## 2019-07-06 LAB — HM MAMMOGRAPHY

## 2019-07-07 DIAGNOSIS — M659 Synovitis and tenosynovitis, unspecified: Secondary | ICD-10-CM | POA: Diagnosis not present

## 2019-07-08 ENCOUNTER — Ambulatory Visit (INDEPENDENT_AMBULATORY_CARE_PROVIDER_SITE_OTHER): Payer: Medicare Other | Admitting: Physician Assistant

## 2019-07-08 ENCOUNTER — Other Ambulatory Visit: Payer: Self-pay

## 2019-07-08 ENCOUNTER — Encounter (INDEPENDENT_AMBULATORY_CARE_PROVIDER_SITE_OTHER): Payer: Self-pay | Admitting: Physician Assistant

## 2019-07-08 VITALS — BP 162/64 | HR 57 | Temp 98.1°F | Ht 66.0 in | Wt 199.0 lb

## 2019-07-08 DIAGNOSIS — Z6832 Body mass index (BMI) 32.0-32.9, adult: Secondary | ICD-10-CM

## 2019-07-08 DIAGNOSIS — E559 Vitamin D deficiency, unspecified: Secondary | ICD-10-CM | POA: Diagnosis not present

## 2019-07-08 DIAGNOSIS — E669 Obesity, unspecified: Secondary | ICD-10-CM

## 2019-07-08 DIAGNOSIS — E119 Type 2 diabetes mellitus without complications: Secondary | ICD-10-CM | POA: Diagnosis not present

## 2019-07-11 ENCOUNTER — Other Ambulatory Visit: Payer: Self-pay | Admitting: Family Medicine

## 2019-07-12 NOTE — Progress Notes (Signed)
Chief Complaint:   OBESITY [name] is here to discuss her progress with her obesity treatment plan along with follow-up of her obesity related diagnoses. [name] is the Category 3 Plan and states she is following her eating plan approximately 90% of the time. [name] states she is exercising 0 minutes 0 times per week.  Today's visit was #: 64 Starting weight: 220 lbs Starting date: 01/19/2018 Today's weight: 199 lbs Today's date: 07/08/2019 Total lbs lost to date: 1 Total lbs lost since last in-office visit: 0  Interim History: [name] reports overindulging on cookies and peanut brittle over the holidays. She has not been exercising, due to toe pain, but she had a steroid injection yesterday and she hopes to be back to working out soon.  Subjective:   Type 2 diabetes mellitus without complication, without long-term current use of insulin ([facility]) Ronasia states fasting BGs range between 117 and 130. She is on metformin. [name] has no nausea, vomiting or diarrhea. She has no hypoglycemia or polyphagia. Her last A1c was 6.1 (06/08/19). Labs were discussed with patient today.  Lab Results  Component Value Date   HGBA1C 6.1 06/08/2019   HGBA1C 5.7 (A) 05/24/2019   HGBA1C 5.7 (A) 01/05/2019   Lab Results  Component Value Date   MICROALBUR <0.7 01/05/2019   [place] 64 06/08/2019   CREATININE 0.63 06/08/2019   Vitamin D deficiency [name]'s Vitamin D level was 52.21 on 06/08/19. She is currently taking OTC vit D 5,000 units. She denies nausea, vomiting or muscle weakness. Labs were discussed with patient today.  Assessment/Plan:   Type 2 diabetes mellitus without complication, without long-term current use of insulin ([facility]) Good blood sugar control is important to decrease the likelihood of diabetic complications such as nephropathy, neuropathy, limb loss, blindness, coronary artery disease, and death. Intensive lifestyle modification including diet,  exercise and weight loss are the first line of treatment for diabetes. [name] will continue with medications and weight loss.  Vitamin D deficiency Low Vitamin D level contributes to fatigue and are associated with obesity, breast, and colon cancer. [name] will continue to take OTC Vitamin D @5 ,000 IU daily and she will follow-up for routine testing of Vitamin D, at least 2-3 times per year to avoid over-replacement.  Obesity [name] is currently in the action stage of change. As such, her goal is to continue with weight loss efforts. She has agreed to follow the modified Category 3 Plan.   Exercise goals: Older adults should follow the adult guidelines. When older adults cannot meet the adult guidelines, they should be as physically active as their abilities and conditions will allow.  Older adults should do exercises that maintain or improve balance if they are at risk of falling.   Behavioral modification strategies: meal planning and cooking strategies and keeping healthy foods in the home.  [name] has agreed to follow-up with our clinic in 2 weeks. She was informed of the importance of frequent follow-up visits to maximize her success with intensive lifestyle modifications for her multiple health conditions.   Objective:   Blood pressure (!) 162/64, pulse (!) 57, temperature 98.1 F (36.7 C), temperature source Oral, height 5\' 6"  (1.676 m), weight 199 lb (90.3 kg), SpO2 98 %. Body mass index is 32.12 kg/m.  General: Cooperative, alert, well developed, in no acute distress. HEENT: Conjunctivae and lids unremarkable. Cardiovascular: Regular rhythm.  Lungs: Normal work of breathing. Neurologic: No focal deficits.   Lab Results  Component Value Date  CREATININE 0.63 06/08/2019   BUN 22 06/08/2019   NA 133 (L) 06/08/2019   K 4.6 06/08/2019   CL 99 06/08/2019   CO2 27 06/08/2019   Lab Results  Component Value Date   ALT 14 06/08/2019   AST 19 06/08/2019    ALKPHOS 155 (H) 06/08/2019   BILITOT 0.4 06/08/2019   Lab Results  Component Value Date   HGBA1C 6.1 06/08/2019   HGBA1C 5.7 (A) 05/24/2019   HGBA1C 5.7 (A) 01/05/2019   HGBA1C 6.1 06/04/2018   HGBA1C 6.6 (H) 01/19/2018   Lab Results  Component Value Date   INSULIN 12.1 01/19/2018   Lab Results  Component Value Date   TSH 3.38 06/04/2018   Lab Results  Component Value Date   CHOL 131 06/08/2019   HDL 53.90 06/08/2019   LDLCALC 64 06/08/2019   LDLDIRECT 58.0 11/25/2017   TRIG 65.0 06/08/2019   CHOLHDL 2 06/08/2019   Lab Results  Component Value Date   WBC 8.0 06/08/2019   HGB 12.4 06/08/2019   HCT 37.9 06/08/2019   MCV 85.3 06/08/2019   PLT 270.0 06/08/2019   No results found for: IRON, TIBC, FERRITIN   Ref. Range 06/08/2019 09:49  VITD Latest Ref Range: 30.00 - 100.00 ng/mL 52.21   Obesity Behavioral Intervention Documentation for Insurance:   Approximately 15 minutes were spent on the discussion below.  ASK: We discussed the diagnosis of obesity with [name] today and [name] agreed to give [name] permission to discuss obesity behavioral modification therapy today.  ASSESS: [name] has the diagnosis of obesity and her BMI today is 32.13. [name] is in the action stage of change.   ADVISE: [name] was educated on the multiple health risks of obesity as well as the benefit of weight loss to improve her health. She was advised of the need for long term treatment and the importance of lifestyle modifications to improve her current health and to decrease her risk of future health problems.  AGREE: Multiple dietary modification options and treatment options were discussed and [name] agreed to follow the recommendations documented in the above note.  ARRANGE: [name] was educated on the importance of frequent visits to treat obesity as outlined per CMS and USPSTF guidelines and agreed to schedule her next follow up appointment  today.  Attestation Statements:   Reviewed by clinician on day of visit: allergies, medications, problem list, medical history, surgical history, family history, social history, and previous encounter notes.  Corey Skains, am acting as Location manager for [employer], PA-C.  I have reviewed the above documentation for accuracy and completeness, and I agree with the above. [name], PA-C

## 2019-07-14 ENCOUNTER — Other Ambulatory Visit: Payer: Self-pay | Admitting: Radiology

## 2019-07-14 DIAGNOSIS — R921 Mammographic calcification found on diagnostic imaging of breast: Secondary | ICD-10-CM | POA: Diagnosis not present

## 2019-07-14 DIAGNOSIS — N6031 Fibrosclerosis of right breast: Secondary | ICD-10-CM | POA: Diagnosis not present

## 2019-07-19 NOTE — Progress Notes (Signed)
Virtual Visit via Video Note The purpose of this virtual visit is to provide medical care while limiting exposure to the novel coronavirus.    Consent was obtained for video visit:  Yes.   Answered questions that patient had about telehealth interaction:  Yes.   I discussed the limitations, risks, security and privacy concerns of performing an evaluation and management service by telemedicine. I also discussed with the patient that there may be a patient responsible charge related to this service. The patient expressed understanding and agreed to proceed.  Pt location: Home Physician Location: office Name of referring provider:  [name], MD I connected with [place] at patients initiation/request on 07/20/2019 at  1:30 PM EST by video enabled telemedicine application and verified that I am speaking with the correct person using two identifiers. Pt MRN:  [id number] Pt DOB:  1951-01-28 Video Participants:  Cristopher Peru Fanfan   History of Present Illness:  [name] is a 69 year old right-handed female with type 2 diabetes, hypertension, hyperlipidemia, status post bovine aortic valve and migraine who follows up for stroke.    UPDATE:  Current medications: Plavix, atorvastatin 40 mg, Lopressor, insulin, metformin.  For migraine prophylaxis, she is taking venlafaxine XR 75 mg daily.  Family medicine notes reviewed.  Maybe 2 or 3 migraines over the past year.   06/08/2019 LABS:  Hgb A1c 6.1; LDL 64; B12 959; Vitamin D 52.21  HISTORY: Ms. Hartner has history of migraines since high school. They are left sided frontal throbbing headaches of moderate to severe intensity. They typically last 3 hours and occur once a week. They are preceded by an aura of spots in the vision of both eyes, lasting 2 hours. They are aggravated by stress and relieved by rest. She typically takes Imitrex, which is effective.  On 08/07/17, she developed one of her habitual  migraines with aura. However, the headache was more severe and the visual aura did not resolve. The headache was severe for about 3 days and then tapered off after another 4 days. The visual aura lasted up to a week. She did not have any facial droop, unilateral numbness or weakness, ataxia or vertigo. CT of head from 08/12/17 was personally reviewed and revealed a small patchy hypodensity in the right occipital lobe concerning for subacute infarct. MRA of head from 08/21/17 was personally reviewed and revealed flow-limiting stenosis versus artifact in the left M2 segement but otherwise intracranial arteries patent, including right PCA. Carotid doppler from 08/19/17 revealed no hemodynamically significant bilateral ICA stenosis and both vertebral arteries were patent with antegrade flow. Echocardiogram from yesterday showed EF 60-65% with no cardiac source of emboli. She is currently with heart monitor.  Her ASA 81mg  was switched to Plavix 75mg  and she was advised to take atorvastatin daily. She was advised to stop Imitrex. Tylenol is ineffective. She takes venlafaxine XR 150mg  daily for anxiety.   Past Medical History: Past Medical History:  Diagnosis Date  . Allergy   . Anxiety   . Arthritis    back & knee  . Asthma     mild per pt shows up with resp illness  . Chronic diastolic congestive heart failure ([place])   . Colon polyps   . Constipation   . Diabetes mellitus without complication ([place])   . Dyspnea   . Gastric polyps   . Gastroparesis   . GERD (gastroesophageal reflux disease)   . Headache    sinus headaches and migraines at times  .  Heart murmur   . History of migraine headaches   . HTN (hypertension)   . Hyperlipidemia   . IBS (irritable bowel syndrome)   . Joint pain   . Lumbar disc disease   . S/P aortic valve replacement with bioprosthetic valve 08/23/2016   25 mm Edwards Intuity rapid-deployment bovine pericardial tissue valve via partial upper mini sternotomy  .  Sleep apnea   . TIA (transient ischemic attack)     Medications: Outpatient Encounter Medications as of 07/20/2019  Medication Sig  . atorvastatin (LIPITOR) 40 MG tablet TAKE 1 TABLET (40 MG TOTAL) BY MOUTH DAILY AT 6 PM.  . Biotin 5000 MCG TABS Take 5,000 mcg by mouth daily.   . calcium-vitamin D (OSCAL WITH D) 500-200 MG-UNIT per tablet Take 2 tablets by mouth daily.   . Cholecalciferol (VITAMIN D) 125 MCG (5000 UT) CAPS Take 5,000 Units by mouth daily.  . clopidogrel (PLAVIX) 75 MG tablet Take 1 tablet (75 mg total) by mouth daily.  [name] demeclocycline (DECLOMYCIN) 300 MG tablet   . ergocalciferol (VITAMIN D2) 1.25 MG (50000 UT) capsule ergocalciferol (vitamin D2) 1,250 mcg (50,000 unit) capsule  . fluticasone (FLONASE) 50 MCG/ACT nasal spray Place 2 sprays into both nostrils daily.   [name] ipratropium (ATROVENT) 0.03 % nasal spray 2 SPRAYS IN EACH NOSTRIL AS NEEDED THREE TIMES AS NEEDED  . Lancets (ONETOUCH DELICA PLUS 123XX123) MISC USE TO TEST BLOOD SUGARS DAILY. DX: E11.9  . loratadine (CLARITIN) 10 MG tablet Take 10 mg by mouth at bedtime.   . Melatonin 5 MG TABS Take by mouth.  . metFORMIN (GLUCOPHAGE) 500 MG tablet TAKE ONE TABLET BY MOUTH WITH BREAKFAST AND 1/2-1 WITH DINNER  . metoCLOPramide (REGLAN) 10 MG tablet TAKE 1/2 TABLET BY MOUTH 4 TIMES A DAY  . metoprolol tartrate (LOPRESSOR) 25 MG tablet Take 0.5 tablets (12.5 mg total) by mouth 2 (two) times daily.  . Multiple Vitamin (MULTIVITAMIN WITH MINERALS) TABS tablet Take 1 tablet by mouth daily.  . naproxen sodium (ALEVE) 220 MG tablet Take 220 mg by mouth.  . OMEPRAZOLE MAGNESIUM PO Take 40 mg by mouth daily with breakfast.   . ONETOUCH VERIO test strip USE TO TEST BLOOD SUGARS DAILY.  . Probiotic Product (ALIGN) 4 MG CAPS Take 4 mg by mouth daily.   . psyllium (METAMUCIL SMOOTH TEXTURE) 28 % packet Take 1 packet by mouth daily before breakfast.   . venlafaxine XR (EFFEXOR-XR) 75 MG 24 hr capsule Take 1 capsule (75 mg total) by  mouth daily with breakfast.  . [DISCONTINUED] aspirin EC 81 MG tablet Take 1 tablet (81 mg total) by mouth daily.  . [DISCONTINUED] atorvastatin (LIPITOR) 40 MG tablet TAKE 1 TABLET (40 MG TOTAL) BY MOUTH DAILY AT 6 PM.  . [DISCONTINUED] clindamycin (CLEOCIN) 300 MG capsule TAKE 2 CAPSULES BY MOUTH 1 HOUR PRIOR TOO DENTAL CLEANING.  . [DISCONTINUED] clopidogrel (PLAVIX) 75 MG tablet TAKE 1 TABLET BY MOUTH EVERY DAY  . [DISCONTINUED] glucose blood (ONETOUCH VERIO) test strip Use to test blood sugars daily. Dx: E11.9  . [DISCONTINUED] Insulin Pen Needle (BD PEN NEEDLE NANO 2ND GEN) 32G X 4 MM MISC 1 Package by Does not apply route 2 (two) times daily.  . [DISCONTINUED] Lancets (ONETOUCH ULTRASOFT) lancets Use to test blood sugars daily. Dx: E11.9  . [DISCONTINUED] metFORMIN (GLUCOPHAGE) 500 MG tablet TAKE 1 TABLET WITH BREAKFAST AND 1/2 TO 1 TABLET WITH DINNER.  . [DISCONTINUED] metoCLOPramide (REGLAN) 10 MG tablet TAKE 1/2 TABLET BY MOUTH 4  TIMES A DAY  . [DISCONTINUED] metoprolol tartrate (LOPRESSOR) 25 MG tablet TAKE 1/2 TAB BY MOUTH TWICE A DAY  . [DISCONTINUED] venlafaxine XR (EFFEXOR-XR) 75 MG 24 hr capsule TAKE 1 CAPSULE (75 MG TOTAL) BY MOUTH DAILY WITH BREAKFAST.  . [DISCONTINUED] Vitamin D, Ergocalciferol, (DRISDOL) 1.25 MG (50000 UT) CAPS capsule Take 1 capsule (50,000 Units total) by mouth every 7 (seven) days.   No facility-administered encounter medications on file as of 07/20/2019.    Allergies: Allergies  Allergen Reactions  . Augmentin [Amoxicillin-Pot Clavulanate] Rash    rash  . Erythromycin Nausea Only  . Penicillins Itching and Rash    Has patient had a PCN reaction causing immediate rash, facial/tongue/throat swelling, SOB or lightheadedness with hypotension: No Has patient had a PCN reaction causing severe rash involving mucus membranes or skin necrosis: No Has patient had a PCN reaction that required hospitalization: No Has patient had a PCN reaction occurring within  the last 10 years: No  If all of the above answers are "NO", then may proceed with Cephalosporin use.     Family History: Family History  Problem Relation Age of Onset  . COPD Mother   . Colon polyps Mother   . Irritable bowel syndrome Mother   . Anxiety disorder Mother   . Heart disease Father   . Alcohol abuse Father   . Colon polyps Maternal Aunt   . Other Neg Hx        hyponatremia    Social History: Social History   Socioeconomic History  . Marital status: Married    Spouse name: [name]  . Number of children: 1  . Years of education: Not on file  . Highest education level: Not on file  Occupational History  . Occupation: Retired, Curator: RETIRED  Tobacco Use  . Smoking status: Never Smoker  . Smokeless tobacco: Never Used  Substance and Sexual Activity  . Alcohol use: No  . Drug use: No  . Sexual activity: Yes  Other Topics Concern  . Not on file  Social History Narrative   She lives with husband (1978) and two dogs. Step-son [name] (1971-psychiatrist in [place]). No grandkids. 2 dogs-sheltie/collie mix and border collie mix      Highest level of education:  Master in education   She is retired Animal nutritionist x 32 years.      Hobbies: time with dogs            Social Determinants of Health   Financial Resource Strain:   . Difficulty of Paying Living Expenses: Not on file  Food Insecurity:   . Worried About Charity fundraiser in the Last Year: Not on file  . Ran Out of Food in the Last Year: Not on file  Transportation Needs:   . Lack of Transportation (Medical): Not on file  . Lack of Transportation (Non-Medical): Not on file  Physical Activity:   . Days of Exercise per Week: Not on file  . Minutes of Exercise per Session: Not on file  Stress:   . Feeling of Stress : Not on file  Social Connections:   . Frequency of Communication with Friends and Family: Not on file  . Frequency of Social Gatherings with Friends  and Family: Not on file  . Attends Religious Services: Not on file  . Active Member of Clubs or Organizations: Not on file  . Attends Archivist Meetings: Not on file  . Marital Status:  Not on file  Intimate Partner Violence:   . Fear of Current or Ex-Partner: Not on file  . Emotionally Abused: Not on file  . Physically Abused: Not on file  . Sexually Abused: Not on file    Observations/Objective:   Height 5\' 6"  (1.676 m), weight 198 lb (89.8 kg). No acute distress.  Alert and oriented.  Speech fluent and not dysarthric.  Language intact.  Eyes orthophoric on primary gaze.  Face symmetric.  Assessment and Plan:   1.  Migraine with persistent aura, with cerebral infarction 2.  Hypertension 3.  Type 2 diabetes mellitus 4.  Hyperlipidemia  1.  Venlafaxine XR 75mg  daily for migraine prophylaxis 2.  Plavix 75mg  daily for secondary stroke prevention 3.  Lipitor (LDL goal less than 70) 4.  Blood pressure and glycemic control 5.  Follow up in one year.  Follow Up Instructions:    -I discussed the assessment and treatment plan with the patient. The patient was provided an opportunity to ask questions and all were answered. The patient agreed with the plan and demonstrated an understanding of the instructions.   The patient was advised to call back or seek an in-person evaluation if the symptoms worsen or if the condition fails to improve as anticipated.   Dudley Major, DO

## 2019-07-20 ENCOUNTER — Other Ambulatory Visit: Payer: Self-pay

## 2019-07-20 ENCOUNTER — Encounter: Payer: Self-pay | Admitting: Neurology

## 2019-07-20 ENCOUNTER — Telehealth (INDEPENDENT_AMBULATORY_CARE_PROVIDER_SITE_OTHER): Payer: Medicare PPO | Admitting: Neurology

## 2019-07-20 VITALS — Ht 66.0 in | Wt 198.0 lb

## 2019-07-20 DIAGNOSIS — G43609 Persistent migraine aura with cerebral infarction, not intractable, without status migrainosus: Secondary | ICD-10-CM | POA: Diagnosis not present

## 2019-07-20 DIAGNOSIS — I639 Cerebral infarction, unspecified: Secondary | ICD-10-CM

## 2019-07-27 ENCOUNTER — Other Ambulatory Visit: Payer: Self-pay

## 2019-07-27 ENCOUNTER — Encounter (INDEPENDENT_AMBULATORY_CARE_PROVIDER_SITE_OTHER): Payer: Self-pay | Admitting: Physician Assistant

## 2019-07-27 ENCOUNTER — Ambulatory Visit (INDEPENDENT_AMBULATORY_CARE_PROVIDER_SITE_OTHER): Payer: Medicare PPO | Admitting: Physician Assistant

## 2019-07-27 VITALS — BP 147/69 | HR 47 | Temp 98.7°F | Ht 66.0 in | Wt 198.0 lb

## 2019-07-27 DIAGNOSIS — E119 Type 2 diabetes mellitus without complications: Secondary | ICD-10-CM

## 2019-07-27 DIAGNOSIS — Z6832 Body mass index (BMI) 32.0-32.9, adult: Secondary | ICD-10-CM

## 2019-07-27 DIAGNOSIS — E669 Obesity, unspecified: Secondary | ICD-10-CM

## 2019-07-27 DIAGNOSIS — E66811 Obesity, class 1: Secondary | ICD-10-CM

## 2019-07-27 NOTE — Progress Notes (Signed)
Chief Complaint:   OBESITY Rielle is here to discuss her progress with her obesity treatment plan along with follow-up of her obesity related diagnoses. Heleyna is on the Category 3 Plan and states she is following her eating plan approximately 90% of the time. Deairah states she is doing 0 minutes 0 times per week.  Today's visit was #: 50 Starting weight: 220 lbs Starting date: 01/19/18 Today's weight: 198 lbs Today's date: 07/27/2019 Total lbs lost to date: 22 Total lbs lost since last in-office visit: 1  Interim History: Mekayla states that she has an emotional 2 weeks after awaiting breast biopsy results, and she thinks she may have done some extra snacking. She is not yet exercising due to foot pain.  Subjective:   1. Type 2 diabetes mellitus without complication, without long-term current use of insulin (HCC) Soleia's last A1c was 6.1, and her fasting BGs ranges between 106 and 128. She is on metformin and denies nausea, vomiting, diarrhea, or hypoglycemia.  Assessment/Plan:   1. Type 2 diabetes mellitus without complication, without long-term current use of insulin (HCC) Good blood sugar control is important to decrease the likelihood of diabetic complications such as nephropathy, neuropathy, limb loss, blindness, coronary artery disease, and death. Intensive lifestyle modification including diet, exercise and weight loss are the first line of treatment for diabetes.   2. Class 1 obesity with serious comorbidity and body mass index (BMI) of 32.0 to 32.9 in adult, unspecified obesity type Lura is currently in the action stage of change. As such, her goal is to continue with weight loss efforts. She has agreed to the Category 3 Plan.   Exercise goals: No exercise has been prescribed at this time.  Behavioral modification strategies: meal planning and cooking strategies and better snacking choices.  Kensie has agreed to follow-up with our clinic in  2 weeks. She was informed of the importance of frequent follow-up visits to maximize her success with intensive lifestyle modifications for her multiple health conditions.   Objective:   Blood pressure (!) 147/69, pulse (!) 47, temperature 98.7 F (37.1 C), temperature source Oral, height 5\' 6"  (1.676 m), weight 198 lb (89.8 kg), SpO2 98 %. Body mass index is 31.96 kg/m.  General: Cooperative, alert, well developed, in no acute distress. HEENT: Conjunctivae and lids unremarkable. Cardiovascular: Regular rhythm.  Lungs: Normal work of breathing. Neurologic: No focal deficits.   Lab Results  Component Value Date   CREATININE 0.63 06/08/2019   BUN 22 06/08/2019   NA 133 (L) 06/08/2019   K 4.6 06/08/2019   CL 99 06/08/2019   CO2 27 06/08/2019   Lab Results  Component Value Date   ALT 14 06/08/2019   AST 19 06/08/2019   ALKPHOS 155 (H) 06/08/2019   BILITOT 0.4 06/08/2019   Lab Results  Component Value Date   HGBA1C 6.1 06/08/2019   HGBA1C 5.7 (A) 05/24/2019   HGBA1C 5.7 (A) 01/05/2019   HGBA1C 6.1 06/04/2018   HGBA1C 6.6 (H) 01/19/2018   Lab Results  Component Value Date   INSULIN 12.1 01/19/2018   Lab Results  Component Value Date   TSH 3.38 06/04/2018   Lab Results  Component Value Date   CHOL 131 06/08/2019   HDL 53.90 06/08/2019   LDLCALC 64 06/08/2019   LDLDIRECT 58.0 11/25/2017   TRIG 65.0 06/08/2019   CHOLHDL 2 06/08/2019   Lab Results  Component Value Date   WBC 8.0 06/08/2019   HGB 12.4 06/08/2019   HCT  37.9 06/08/2019   MCV 85.3 06/08/2019   PLT 270.0 06/08/2019   No results found for: IRON, TIBC, FERRITIN  Obesity Behavioral Intervention Documentation for Insurance:   Approximately 15 minutes were spent on the discussion below.  ASK: We discussed the diagnosis of obesity with [name] today and Taji agreed to give [name] permission to discuss obesity behavioral modification therapy today.  ASSESS: Melana has the diagnosis of  obesity and her BMI today is 31.97. Pauline is in the action stage of change.   ADVISE: Vanassa was educated on the multiple health risks of obesity as well as the benefit of weight loss to improve her health. She was advised of the need for long term treatment and the importance of lifestyle modifications to improve her current health and to decrease her risk of future health problems.  AGREE: Multiple dietary modification options and treatment options were discussed and Braniyah agreed to follow the recommendations documented in the above note.  ARRANGE: Amri was educated on the importance of frequent visits to treat obesity as outlined per CMS and USPSTF guidelines and agreed to schedule her next follow up appointment today.  Attestation Statements:   Reviewed by clinician on day of visit: allergies, medications, problem list, medical history, surgical history, family history, social history, and previous encounter notes.   Wilhemena Durie, am acting as transcriptionist for [employer], PA-C.  I have reviewed the above documentation for accuracy and completeness, and I agree with the above. [name], PA-C

## 2019-07-30 ENCOUNTER — Other Ambulatory Visit: Payer: Self-pay

## 2019-08-03 ENCOUNTER — Encounter: Payer: Self-pay | Admitting: Endocrinology

## 2019-08-03 ENCOUNTER — Other Ambulatory Visit: Payer: Self-pay

## 2019-08-03 ENCOUNTER — Ambulatory Visit: Payer: Medicare PPO | Admitting: Endocrinology

## 2019-08-03 VITALS — BP 120/76 | HR 60 | Ht 66.0 in | Wt 206.2 lb

## 2019-08-03 DIAGNOSIS — E119 Type 2 diabetes mellitus without complications: Secondary | ICD-10-CM | POA: Diagnosis not present

## 2019-08-03 DIAGNOSIS — E871 Hypo-osmolality and hyponatremia: Secondary | ICD-10-CM | POA: Diagnosis not present

## 2019-08-03 LAB — BASIC METABOLIC PANEL
BUN: 22 mg/dL (ref 6–23)
CO2: 27 mEq/L (ref 19–32)
Calcium: 9 mg/dL (ref 8.4–10.5)
Chloride: 95 mEq/L — ABNORMAL LOW (ref 96–112)
Creatinine, Ser: 0.65 mg/dL (ref 0.40–1.20)
GFR: 90.46 mL/min (ref >60.00)
Glucose, Bld: 100 mg/dL — ABNORMAL HIGH (ref 70–99)
Potassium: 4.4 mEq/L (ref 3.5–5.1)
Sodium: 127 mEq/L — ABNORMAL LOW (ref 135–145)

## 2019-08-03 MED ORDER — DEMECLOCYCLINE HCL 300 MG PO TABS: 300.0000 mg | ORAL_TABLET | Freq: Two times a day (BID) | ORAL | 3 refills | Status: DC

## 2019-08-03 MED ORDER — DEMECLOCYCLINE HCL 300 MG PO TABS: 150.0000 mg | ORAL_TABLET | Freq: Every day | ORAL | 3 refills | Status: DC

## 2019-08-03 NOTE — Progress Notes (Signed)
Subjective:    Patient ID: [name], female    DOB: [birth year], 69 y.o.   MRN: [id number]  HPI Pt returns for f/u of hyponatremia (dx'ed 2013; only cause found was metaclopramide, which she cannot safely stop; she takes demeclocycline; ACTH stim test, creat, and TFT were normal; MRI showed old CVA's).  pt states she feels well in general.  She takes meds as rx'ed.   Pt was noted to have elev AP.  etiol is uncertain.  No h/o liver, bone, or intestinal probs (except for IBS).   Past Medical History:  Diagnosis Date  . Allergy   . Anxiety   . Arthritis    back & knee  . Asthma     mild per pt shows up with resp illness  . Chronic diastolic congestive heart failure ([place])   . Colon polyps   . Constipation   . Diabetes mellitus without complication ([place])   . Dyspnea   . Gastric polyps   . Gastroparesis   . GERD (gastroesophageal reflux disease)   . Headache    sinus headaches and migraines at times  . Heart murmur   . History of migraine headaches   . HTN (hypertension)   . Hyperlipidemia   . IBS (irritable bowel syndrome)   . Joint pain   . Lumbar disc disease   . S/P aortic valve replacement with bioprosthetic valve 08/23/2016   25 mm Edwards Intuity rapid-deployment bovine pericardial tissue valve via partial upper mini sternotomy  . Sleep apnea   . TIA (transient ischemic attack)     Past Surgical History:  Procedure Laterality Date  . ABDOMINAL HYSTERECTOMY    . AORTIC VALVE REPLACEMENT N/A 08/23/2016   Procedure: AORTIC VALVE REPLACEMENT (AVR) - using partial Upper Sternotomy- 35mm Edwards Intuity Aortic Valve used;  Surgeon: Rexene Alberts, MD;  Location: [facility];  Service: Open Heart Surgery;  Laterality: N/A;  . BLADDER SUSPENSION  2007  . BREAST ENHANCEMENT SURGERY  1980  . CARPAL TUNNEL RELEASE Bilateral   . COLONOSCOPY    . DORSAL COMPARTMENT RELEASE Right 09/15/2014   Procedure: RIGHT WRIST DEQUERVAINS RELEASE ;  Surgeon: Kathryne Hitch, MD;  Location:  [facility];  Service: Orthopedics;  Laterality: Right;  . ESOPHAGOGASTRODUODENOSCOPY    . KNEE ARTHROSCOPY  04/15/2012   Procedure: ARTHROSCOPY KNEE;  Surgeon: [name], MD;  Location: [facility];  Service: Orthopedics;  Laterality: Right;  . LAPAROSCOPIC CHOLECYSTECTOMY    . LASIK    . OOPHORECTOMY    . PALATE TO GINGIVA GRAFT  2017  . RIGHT/LEFT HEART CATH AND CORONARY ANGIOGRAPHY N/A 08/02/2016   Procedure: Right/Left Heart Cath and Coronary Angiography;  Surgeon: Sherren Mocha, MD;  Location: [facility];  Service: Cardiovascular;  Laterality: N/A;  . ROTATOR CUFF REPAIR Left   . TEE WITHOUT CARDIOVERSION N/A 08/23/2016   Procedure: TRANSESOPHAGEAL ECHOCARDIOGRAM (TEE);  Surgeon: Rexene Alberts, MD;  Location: [facility];  Service: Open Heart Surgery;  Laterality: N/A;  . TOTAL KNEE ARTHROPLASTY  04/15/2012   Procedure: TOTAL KNEE ARTHROPLASTY;  Surgeon: [name], MD;  Location: [facility];  Service: Orthopedics;  Laterality: Right;  . TRIGGER FINGER RELEASE Right 04/20/2015   Procedure: RIGHT TRIGGER FINGER RELEASE (TENDON SHEALTH INCISION) ;  Surgeon: [name], MD;  Location: [facility];  Service: Orthopedics;  Laterality: Right;    Social History   Socioeconomic History  . Marital status: Married    Spouse  name: [name]  . Number of children: 1  . Years of education: Not on file  . Highest education level: Not on file  Occupational History  . Occupation: Retired, Curator: RETIRED  Tobacco Use  . Smoking status: Never Smoker  . Smokeless tobacco: Never Used  Substance and Sexual Activity  . Alcohol use: No  . Drug use: No  . Sexual activity: Yes  Other Topics Concern  . Not on file  Social History Narrative   She lives with husband (1978) and two dogs. Step-son [name] (1971-psychiatrist in [place]). No grandkids. 2 dogs-sheltie/collie mix and border collie mix      Highest level of education:  Master  in education   She is retired Animal nutritionist x 32 years.      Hobbies: time with dogs   Right handed   One story home         Social Determinants of Health   Financial Resource Strain:   . Difficulty of Paying Living Expenses: Not on file  Food Insecurity:   . Worried About Charity fundraiser in the Last Year: Not on file  . Ran Out of Food in the Last Year: Not on file  Transportation Needs:   . Lack of Transportation (Medical): Not on file  . Lack of Transportation (Non-Medical): Not on file  Physical Activity:   . Days of Exercise per Week: Not on file  . Minutes of Exercise per Session: Not on file  Stress:   . Feeling of Stress : Not on file  Social Connections:   . Frequency of Communication with Friends and Family: Not on file  . Frequency of Social Gatherings with Friends and Family: Not on file  . Attends Religious Services: Not on file  . Active Member of Clubs or Organizations: Not on file  . Attends Archivist Meetings: Not on file  . Marital Status: Not on file  Intimate Partner Violence:   . Fear of Current or Ex-Partner: Not on file  . Emotionally Abused: Not on file  . Physically Abused: Not on file  . Sexually Abused: Not on file    Current Outpatient Medications on File Prior to Visit  Medication Sig Dispense Refill  . atorvastatin (LIPITOR) 40 MG tablet TAKE 1 TABLET (40 MG TOTAL) BY MOUTH DAILY AT 6 PM. 90 tablet 3  . Biotin 5000 MCG TABS Take 5,000 mcg by mouth daily.     . calcium-vitamin D (OSCAL WITH D) 500-200 MG-UNIT per tablet Take 2 tablets by mouth daily.     . Cholecalciferol (VITAMIN D) 125 MCG (5000 UT) CAPS Take 5,000 Units by mouth daily.    . clopidogrel (PLAVIX) 75 MG tablet Take 1 tablet (75 mg total) by mouth daily. 90 tablet 3  . ergocalciferol (VITAMIN D2) 1.25 MG (50000 UT) capsule ergocalciferol (vitamin D2) 1,250 mcg (50,000 unit) capsule    . fluticasone (FLONASE) 50 MCG/ACT nasal spray Place 2 sprays into both  nostrils daily.     [name] ipratropium (ATROVENT) 0.03 % nasal spray 2 SPRAYS IN EACH NOSTRIL AS NEEDED THREE TIMES AS NEEDED    . Lancets (ONETOUCH DELICA PLUS 123XX123) MISC USE TO TEST BLOOD SUGARS DAILY. DX: E11.9 100 each 12  . loratadine (CLARITIN) 10 MG tablet Take 10 mg by mouth at bedtime.     . Melatonin 5 MG TABS Take by mouth.    . metFORMIN (GLUCOPHAGE) 500 MG tablet TAKE ONE TABLET  BY MOUTH WITH BREAKFAST AND 1/2-1 WITH DINNER 180 tablet 1  . metoCLOPramide (REGLAN) 10 MG tablet TAKE 1/2 TABLET BY MOUTH 4 TIMES A DAY 60 tablet 5  . metoprolol tartrate (LOPRESSOR) 25 MG tablet Take 0.5 tablets (12.5 mg total) by mouth 2 (two) times daily. 45 tablet 3  . Multiple Vitamin (MULTIVITAMIN WITH MINERALS) TABS tablet Take 1 tablet by mouth daily.    . naproxen sodium (ALEVE) 220 MG tablet Take 220 mg by mouth.    . OMEPRAZOLE MAGNESIUM PO Take 40 mg by mouth daily with breakfast.     . ONETOUCH VERIO test strip USE TO TEST BLOOD SUGARS DAILY. 100 strip 4  . Probiotic Product (ALIGN) 4 MG CAPS Take 4 mg by mouth daily.     . psyllium (METAMUCIL SMOOTH TEXTURE) 28 % packet Take 1 packet by mouth daily before breakfast.     . venlafaxine XR (EFFEXOR-XR) 75 MG 24 hr capsule Take 1 capsule (75 mg total) by mouth daily with breakfast. 90 capsule 2   No current facility-administered medications on file prior to visit.     Family History  Problem Relation Age of Onset  . COPD Mother   . Colon polyps Mother   . Irritable bowel syndrome Mother   . Anxiety disorder Mother   . Heart disease Father   . Alcohol abuse Father   . Colon polyps Maternal Aunt   . Other Neg Hx        hyponatremia    BP 120/76 (BP Location: Left Arm, Patient Position: Sitting, Cuff Size: Large)   Pulse 60   Ht 5\' 6"  (1.676 m)   Wt 206 lb 3.2 oz (93.5 kg)   LMP  (LMP Unknown)   SpO2 98%   BMI 33.28 kg/m    Review of Systems Denies nausea and dizziness.      Objective:   Physical Exam VITAL SIGNS:  See vs  page GENERAL: no distress EXT: no leg edema.    Lab Results  Component Value Date   CREATININE 0.65 08/03/2019   BUN 22 08/03/2019   NA 127 (L) 08/03/2019   K 4.4 08/03/2019   CL 95 (L) 08/03/2019   CO2 27 08/03/2019        Assessment & Plan:  elev AP, new to me.  Check isoenzymes Hyponatremia, worse.  Increase demeclomycin

## 2019-08-03 NOTE — Patient Instructions (Addendum)
Blood tests are requested for you today.  We'll let you know about the results.  If the sodium is low, we'll increase the demeclocycline.  We'll also check the breakdown of the alkaline phosphatase.  Please come back for a follow-up appointment in 3 months.

## 2019-08-04 ENCOUNTER — Encounter: Payer: Self-pay | Admitting: Endocrinology

## 2019-08-04 ENCOUNTER — Other Ambulatory Visit: Payer: Self-pay | Admitting: Family Medicine

## 2019-08-04 ENCOUNTER — Other Ambulatory Visit: Payer: Self-pay

## 2019-08-04 DIAGNOSIS — E871 Hypo-osmolality and hyponatremia: Secondary | ICD-10-CM

## 2019-08-04 MED ORDER — DEMECLOCYCLINE HCL 300 MG PO TABS: 300.0000 mg | ORAL_TABLET | Freq: Two times a day (BID) | ORAL | 3 refills | Status: DC

## 2019-08-05 ENCOUNTER — Other Ambulatory Visit: Payer: Self-pay | Admitting: Endocrinology

## 2019-08-05 DIAGNOSIS — R748 Abnormal levels of other serum enzymes: Secondary | ICD-10-CM

## 2019-08-05 LAB — ALKALINE PHOSPHATASE, ISOENZYMES
Alkaline Phosphatase: 192 IU/L — ABNORMAL HIGH (ref 39–117)
BONE FRACTION: 57 % (ref 14–68)
INTESTINAL FRAC.: 6 % (ref 0–18)
LIVER FRACTION: 37 % (ref 18–85)

## 2019-08-06 ENCOUNTER — Other Ambulatory Visit: Payer: Self-pay | Admitting: Family Medicine

## 2019-08-06 ENCOUNTER — Encounter: Payer: Self-pay | Admitting: Endocrinology

## 2019-08-06 ENCOUNTER — Encounter: Payer: Self-pay | Admitting: Family Medicine

## 2019-08-06 DIAGNOSIS — Z953 Presence of xenogenic heart valve: Secondary | ICD-10-CM

## 2019-08-06 NOTE — Telephone Encounter (Signed)
Please review and advise.

## 2019-08-10 ENCOUNTER — Encounter (INDEPENDENT_AMBULATORY_CARE_PROVIDER_SITE_OTHER): Payer: Self-pay | Admitting: Physician Assistant

## 2019-08-10 ENCOUNTER — Other Ambulatory Visit: Payer: Self-pay

## 2019-08-10 ENCOUNTER — Ambulatory Visit (INDEPENDENT_AMBULATORY_CARE_PROVIDER_SITE_OTHER): Payer: Medicare PPO | Admitting: Physician Assistant

## 2019-08-10 VITALS — BP 152/54 | HR 48 | Temp 99.0°F | Ht 66.0 in | Wt 198.0 lb

## 2019-08-10 DIAGNOSIS — E119 Type 2 diabetes mellitus without complications: Secondary | ICD-10-CM

## 2019-08-10 DIAGNOSIS — E669 Obesity, unspecified: Secondary | ICD-10-CM | POA: Diagnosis not present

## 2019-08-10 DIAGNOSIS — Z6832 Body mass index (BMI) 32.0-32.9, adult: Secondary | ICD-10-CM | POA: Diagnosis not present

## 2019-08-10 NOTE — Progress Notes (Signed)
Chief Complaint:   OBESITY [name] is here to discuss her progress with her obesity treatment plan along with follow-up of her obesity related diagnoses. [name] is on the Category 3 Plan and states she is following her eating plan approximately 95% of the time. [name] states she is exercising 0 minutes 0 times per week.  Today's visit was #: 62 Starting weight: 220 lbs Starting date: 01/19/2018 Today's weight: 198 lbs Today's date: 08/10/2019 Total lbs lost to date: 22 Total lbs lost since last in-office visit: 0  Interim History: [name] reports that she has been following the plan closely. Her hunger is controlled. She is eating out daily for her dinner portion of the plan. She eats yogurt at night when she takes her medications.   Subjective:   Type 2 diabetes mellitus without complication, without long-term current use of insulin ([place]). Fasting blood sugars range between 105 and 124. [name] is on metformin. No nausea, vomiting, or diarrhea. No polyphagia.  Lab Results  Component Value Date   HGBA1C 6.1 06/08/2019   HGBA1C 5.7 (A) 05/24/2019   HGBA1C 5.7 (A) 01/05/2019   Lab Results  Component Value Date   MICROALBUR <0.7 01/05/2019   [place] 64 06/08/2019   CREATININE 0.65 08/03/2019   Lab Results  Component Value Date   INSULIN 12.1 01/19/2018    Assessment/Plan:   Type 2 diabetes mellitus without complication, without long-term current use of insulin ([place]). Good blood sugar control is important to decrease the likelihood of diabetic complications such as nephropathy, neuropathy, limb loss, blindness, coronary artery disease, and death. Intensive lifestyle modification including diet, exercise and weight loss are the first line of treatment for diabetes. She will continue her medications and will restart exercise when able.  Class 1 obesity with serious comorbidity and body mass index (BMI) of 32.0 to 32.9 in adult, unspecified obesity  type.  [name] is currently in the action stage of change. As such, her goal is to continue with weight loss efforts. She has agreed to the Category 3 Plan.   Exercise goals: Older adults should follow the adult guidelines. When older adults cannot meet the adult guidelines, they should be as physically active as their abilities and conditions will allow.   Behavioral modification strategies: meal planning and cooking strategies and keeping healthy foods in the home.  [name] has agreed to follow-up with our clinic in 2 weeks. She was informed of the importance of frequent follow-up visits to maximize her success with intensive lifestyle modifications for her multiple health conditions.   Objective:   Blood pressure (!) 152/54, pulse (!) 48, temperature 99 F (37.2 C), temperature source Oral, height 5\' 6"  (1.676 m), weight 198 lb (89.8 kg), SpO2 98 %. Body mass index is 31.96 kg/m.  General: Cooperative, alert, well developed, in no acute distress. HEENT: Conjunctivae and lids unremarkable. Cardiovascular: Regular rhythm.  Lungs: Normal work of breathing. Neurologic: No focal deficits.   Lab Results  Component Value Date   CREATININE 0.65 08/03/2019   BUN 22 08/03/2019   NA 127 (L) 08/03/2019   K 4.4 08/03/2019   CL 95 (L) 08/03/2019   CO2 27 08/03/2019   Lab Results  Component Value Date   ALT 14 06/08/2019   AST 19 06/08/2019   ALKPHOS 192 (H) 08/03/2019   BILITOT 0.4 06/08/2019   Lab Results  Component Value Date   HGBA1C 6.1 06/08/2019   HGBA1C 5.7 (A) 05/24/2019   HGBA1C 5.7 (A) 01/05/2019  HGBA1C 6.1 06/04/2018   HGBA1C 6.6 (H) 01/19/2018   Lab Results  Component Value Date   INSULIN 12.1 01/19/2018   Lab Results  Component Value Date   TSH 3.38 06/04/2018   Lab Results  Component Value Date   CHOL 131 06/08/2019   HDL 53.90 06/08/2019   LDLCALC 64 06/08/2019   LDLDIRECT 58.0 11/25/2017   TRIG 65.0 06/08/2019   CHOLHDL 2 06/08/2019   Lab  Results  Component Value Date   WBC 8.0 06/08/2019   HGB 12.4 06/08/2019   HCT 37.9 06/08/2019   MCV 85.3 06/08/2019   PLT 270.0 06/08/2019   No results found for: IRON, TIBC, FERRITIN  Obesity Behavioral Intervention Documentation for Insurance:   Approximately 15 minutes were spent on the discussion below.  ASK: We discussed the diagnosis of obesity with [name] today and [name] agreed to give [name] permission to discuss obesity behavioral modification therapy today.  ASSESS: [name] has the diagnosis of obesity and her BMI today is 32.0. [name] is in the action stage of change.   ADVISE: [name] was educated on the multiple health risks of obesity as well as the benefit of weight loss to improve her health. She was advised of the need for long term treatment and the importance of lifestyle modifications to improve her current health and to decrease her risk of future health problems.  AGREE: Multiple dietary modification options and treatment options were discussed and [name] agreed to follow the recommendations documented in the above note.  ARRANGE: [name] was educated on the importance of frequent visits to treat obesity as outlined per CMS and USPSTF guidelines and agreed to schedule her next follow up appointment today.  Attestation Statements:   Reviewed by clinician on day of visit: allergies, medications, problem list, medical history, surgical history, family history, social history, and previous encounter notes.  IMichaelene Song, am acting as transcriptionist for [name], PA-C   I have reviewed the above documentation for accuracy and completeness, and I agree with the above. [name], PA-C

## 2019-08-15 ENCOUNTER — Ambulatory Visit: Payer: Medicare PPO | Attending: Internal Medicine

## 2019-08-15 DIAGNOSIS — Z23 Encounter for immunization: Secondary | ICD-10-CM | POA: Insufficient documentation

## 2019-08-15 NOTE — Progress Notes (Signed)
   Covid-19 Vaccination Clinic  Name:  [name]    MRN: [id number] DOB: 1950/11/27  08/15/2019  [name] was observed post Covid-19 immunization for 15 minutes without incidence. She was provided with Vaccine Information Sheet and instruction to access the V-Safe system.   [name] was instructed to call 911 with any severe reactions post vaccine: [name] Difficulty breathing  . Swelling of your face and throat  . A fast heartbeat  . A bad rash all over your body  . Dizziness and weakness    Immunizations Administered    Name Date Dose VIS Date Route   Pfizer COVID-19 Vaccine 08/15/2019  2:55 PM 0.3 mL 06/04/2019 Intramuscular   Manufacturer: [place]   Lot: [id number]   [place]: [id number]

## 2019-08-20 ENCOUNTER — Ambulatory Visit: Payer: Medicare PPO | Admitting: Endocrinology

## 2019-08-24 ENCOUNTER — Ambulatory Visit: Payer: Medicare PPO | Admitting: Endocrinology

## 2019-08-26 ENCOUNTER — Encounter (INDEPENDENT_AMBULATORY_CARE_PROVIDER_SITE_OTHER): Payer: Self-pay | Admitting: Physician Assistant

## 2019-08-26 ENCOUNTER — Other Ambulatory Visit: Payer: Self-pay

## 2019-08-26 ENCOUNTER — Ambulatory Visit (INDEPENDENT_AMBULATORY_CARE_PROVIDER_SITE_OTHER): Payer: Medicare PPO | Admitting: Physician Assistant

## 2019-08-26 VITALS — BP 147/65 | HR 45 | Temp 99.1°F | Ht 66.0 in | Wt 196.0 lb

## 2019-08-26 DIAGNOSIS — Z6831 Body mass index (BMI) 31.0-31.9, adult: Secondary | ICD-10-CM | POA: Diagnosis not present

## 2019-08-26 DIAGNOSIS — E119 Type 2 diabetes mellitus without complications: Secondary | ICD-10-CM | POA: Diagnosis not present

## 2019-08-26 DIAGNOSIS — E669 Obesity, unspecified: Secondary | ICD-10-CM | POA: Diagnosis not present

## 2019-08-26 NOTE — Telephone Encounter (Signed)
Please advise 

## 2019-08-26 NOTE — Progress Notes (Signed)
Chief Complaint:   OBESITY [name] is here to discuss her progress with her obesity treatment plan along with follow-up of her obesity related diagnoses. [name] is on the Category 3 Plan and states she is following her eating plan approximately 95% of the time. [name] states she is doing 0 minutes 0 times per week.  Today's visit was #: 22 Starting weight: 220 lbs Starting date: 01/19/18 Today's weight: 196 lbs Today's date: 08/26/2019 Total lbs lost to date: 24 Total lbs lost since last in-office visit: 2  Interim History: [name] reports that she is not back to working out as she has lost some motivation. She is following the plan well.  Subjective:   1. Type 2 diabetes mellitus without complication, without long-term current use of insulin ([facility]) [name]'s fasting blood sugars range between 107 and 122. Last A1c was 6.1, and she is on metformin.  Assessment/Plan:   1. Type 2 diabetes mellitus without complication, without long-term current use of insulin ([facility]) Good blood sugar control is important to decrease the likelihood of diabetic complications such as nephropathy, neuropathy, limb loss, blindness, coronary artery disease, and death. Intensive lifestyle modification including diet, exercise and weight loss are the first line of treatment for diabetes. [name] will continue her medications and weight loss, and will continue to monitor.  2. Class 1 obesity with serious comorbidity and body mass index (BMI) of 31.0 to 31.9 in adult, unspecified obesity type [name] is currently in the action stage of change. As such, her goal is to continue with weight loss efforts. She has agreed to the Category 3 Plan.   Exercise goals: No exercise has been prescribed at this time.  Behavioral modification strategies: meal planning and cooking strategies and planning for success.  [name] has agreed to follow-up with our clinic in 2 weeks. She was informed of the  importance of frequent follow-up visits to maximize her success with intensive lifestyle modifications for her multiple health conditions.   Objective:   Blood pressure (!) 147/65, pulse (!) 45, temperature 99.1 F (37.3 C), temperature source Oral, height 5\' 6"  (1.676 m), weight 196 lb (88.9 kg), SpO2 100 %. Body mass index is 31.64 kg/m.  General: Cooperative, alert, well developed, in no acute distress. HEENT: Conjunctivae and lids unremarkable. Cardiovascular: Regular rhythm.  Lungs: Normal work of breathing. Neurologic: No focal deficits.   Lab Results  Component Value Date   CREATININE 0.65 08/03/2019   BUN 22 08/03/2019   NA 127 (L) 08/03/2019   K 4.4 08/03/2019   CL 95 (L) 08/03/2019   CO2 27 08/03/2019   Lab Results  Component Value Date   ALT 14 06/08/2019   AST 19 06/08/2019   ALKPHOS 192 (H) 08/03/2019   BILITOT 0.4 06/08/2019   Lab Results  Component Value Date   HGBA1C 6.1 06/08/2019   HGBA1C 5.7 (A) 05/24/2019   HGBA1C 5.7 (A) 01/05/2019   HGBA1C 6.1 06/04/2018   HGBA1C 6.6 (H) 01/19/2018   Lab Results  Component Value Date   INSULIN 12.1 01/19/2018   Lab Results  Component Value Date   TSH 3.38 06/04/2018   Lab Results  Component Value Date   CHOL 131 06/08/2019   HDL 53.90 06/08/2019   LDLCALC 64 06/08/2019   LDLDIRECT 58.0 11/25/2017   TRIG 65.0 06/08/2019   CHOLHDL 2 06/08/2019   Lab Results  Component Value Date   WBC 8.0 06/08/2019   HGB 12.4 06/08/2019   HCT 37.9 06/08/2019   MCV  85.3 06/08/2019   PLT 270.0 06/08/2019   No results found for: IRON, TIBC, FERRITIN  Obesity Behavioral Intervention Documentation for Insurance:   Approximately 15 minutes were spent on the discussion below.  ASK: We discussed the diagnosis of obesity with [name] today and [name] agreed to give [name] permission to discuss obesity behavioral modification therapy today.  ASSESS: [name] has the diagnosis of obesity and her BMI today is  31.65. [name] is in the action stage of change.   ADVISE: [name] was educated on the multiple health risks of obesity as well as the benefit of weight loss to improve her health. She was advised of the need for long term treatment and the importance of lifestyle modifications to improve her current health and to decrease her risk of future health problems.  AGREE: Multiple dietary modification options and treatment options were discussed and [name] agreed to follow the recommendations documented in the above note.  ARRANGE: [name] was educated on the importance of frequent visits to treat obesity as outlined per CMS and USPSTF guidelines and agreed to schedule her next follow up appointment today.  Attestation Statements:   Reviewed by clinician on day of visit: allergies, medications, problem list, medical history, surgical history, family history, social history, and previous encounter notes.   Wilhemena Durie, am acting as transcriptionist for [employer], PA-C.  I have reviewed the above documentation for accuracy and completeness, and I agree with the above. Hayley Potash, PA-C

## 2019-08-27 ENCOUNTER — Encounter (INDEPENDENT_AMBULATORY_CARE_PROVIDER_SITE_OTHER): Payer: Self-pay | Admitting: Physician Assistant

## 2019-08-30 ENCOUNTER — Encounter (HOSPITAL_COMMUNITY)
Admission: RE | Admit: 2019-08-30 | Discharge: 2019-08-30 | Disposition: A | Discharge status: Home / Self Care | Payer: Medicare PPO | Source: Ambulatory Visit | Attending: Endocrinology | Admitting: Endocrinology

## 2019-08-30 ENCOUNTER — Other Ambulatory Visit: Payer: Self-pay

## 2019-08-30 DIAGNOSIS — R748 Abnormal levels of other serum enzymes: Secondary | ICD-10-CM | POA: Insufficient documentation

## 2019-08-30 MED ORDER — TECHNETIUM TC 99M MEDRONATE IV KIT
20.0000 | PACK | Freq: Once | INTRAVENOUS | Status: AC
  Administered 2019-08-30: 20 via INTRAVENOUS

## 2019-08-31 ENCOUNTER — Other Ambulatory Visit: Payer: Self-pay | Admitting: Endocrinology

## 2019-08-31 DIAGNOSIS — R748 Abnormal levels of other serum enzymes: Secondary | ICD-10-CM

## 2019-09-01 ENCOUNTER — Encounter: Payer: Self-pay | Admitting: Endocrinology

## 2019-09-02 ENCOUNTER — Ambulatory Visit
Admission: RE | Admit: 2019-09-02 | Discharge: 2019-09-02 | Disposition: A | Discharge status: Home / Self Care | Payer: Medicare PPO | Source: Ambulatory Visit | Attending: Endocrinology | Admitting: Endocrinology

## 2019-09-02 ENCOUNTER — Other Ambulatory Visit: Payer: Self-pay

## 2019-09-02 DIAGNOSIS — R748 Abnormal levels of other serum enzymes: Secondary | ICD-10-CM

## 2019-09-04 ENCOUNTER — Other Ambulatory Visit: Payer: Self-pay | Admitting: Family Medicine

## 2019-09-04 ENCOUNTER — Encounter: Payer: Self-pay | Admitting: Family Medicine

## 2019-09-05 ENCOUNTER — Encounter: Payer: Self-pay | Admitting: Endocrinology

## 2019-09-05 ENCOUNTER — Encounter (INDEPENDENT_AMBULATORY_CARE_PROVIDER_SITE_OTHER): Payer: Self-pay | Admitting: Physician Assistant

## 2019-09-06 ENCOUNTER — Other Ambulatory Visit: Payer: Self-pay

## 2019-09-06 MED ORDER — GLUCOSE BLOOD VI STRP: ORAL_STRIP | 12 refills | Status: DC

## 2019-09-06 MED ORDER — ACCU-CHEK SOFTCLIX LANCETS MISC: 12 refills | Status: DC

## 2019-09-07 NOTE — Telephone Encounter (Signed)
Please review

## 2019-09-08 ENCOUNTER — Ambulatory Visit: Payer: Medicare PPO | Attending: Internal Medicine

## 2019-09-08 DIAGNOSIS — Z23 Encounter for immunization: Secondary | ICD-10-CM

## 2019-09-08 NOTE — Progress Notes (Signed)
   Covid-19 Vaccination Clinic  Name:  [name]    MRN: [id number] DOB: 1950/11/09  09/08/2019  [name] was observed post Covid-19 immunization for 15 minutes without incident. She was provided with Vaccine Information Sheet and instruction to access the V-Safe system.   [name] was instructed to call 911 with any severe reactions post vaccine: [name] Difficulty breathing  . Swelling of face and throat  . A fast heartbeat  . A bad rash all over body  . Dizziness and weakness   Immunizations Administered    Name Date Dose VIS Date Route   Pfizer COVID-19 Vaccine 09/08/2019  2:33 PM 0.3 mL 06/04/2019 Intramuscular   Manufacturer: [place]   Lot: [id number]   [place]: [id number]

## 2019-09-11 ENCOUNTER — Other Ambulatory Visit: Payer: Self-pay | Admitting: Family Medicine

## 2019-09-11 ENCOUNTER — Encounter (INDEPENDENT_AMBULATORY_CARE_PROVIDER_SITE_OTHER): Payer: Self-pay | Admitting: Physician Assistant

## 2019-09-14 ENCOUNTER — Ambulatory Visit (INDEPENDENT_AMBULATORY_CARE_PROVIDER_SITE_OTHER): Payer: Medicare PPO | Admitting: Physician Assistant

## 2019-09-15 DIAGNOSIS — G4733 Obstructive sleep apnea (adult) (pediatric): Secondary | ICD-10-CM | POA: Diagnosis not present

## 2019-09-15 DIAGNOSIS — M25552 Pain in left hip: Secondary | ICD-10-CM | POA: Diagnosis not present

## 2019-09-16 ENCOUNTER — Encounter (INDEPENDENT_AMBULATORY_CARE_PROVIDER_SITE_OTHER): Payer: Self-pay | Admitting: Physician Assistant

## 2019-09-20 NOTE — Telephone Encounter (Signed)
Please review

## 2019-09-23 ENCOUNTER — Ambulatory Visit (INDEPENDENT_AMBULATORY_CARE_PROVIDER_SITE_OTHER): Payer: Medicare PPO | Admitting: Physician Assistant

## 2019-09-23 ENCOUNTER — Encounter (INDEPENDENT_AMBULATORY_CARE_PROVIDER_SITE_OTHER): Payer: Self-pay | Admitting: Physician Assistant

## 2019-09-23 ENCOUNTER — Other Ambulatory Visit: Payer: Self-pay

## 2019-09-23 VITALS — BP 160/64 | HR 50 | Temp 99.0°F | Ht 66.0 in | Wt 197.0 lb

## 2019-09-23 DIAGNOSIS — Z6831 Body mass index (BMI) 31.0-31.9, adult: Secondary | ICD-10-CM

## 2019-09-23 DIAGNOSIS — E119 Type 2 diabetes mellitus without complications: Secondary | ICD-10-CM | POA: Diagnosis not present

## 2019-09-23 DIAGNOSIS — E669 Obesity, unspecified: Secondary | ICD-10-CM | POA: Diagnosis not present

## 2019-09-23 DIAGNOSIS — Z9989 Dependence on other enabling machines and devices: Secondary | ICD-10-CM | POA: Diagnosis not present

## 2019-09-23 DIAGNOSIS — G4733 Obstructive sleep apnea (adult) (pediatric): Secondary | ICD-10-CM | POA: Diagnosis not present

## 2019-09-23 NOTE — Progress Notes (Signed)
Chief Complaint:   OBESITY [name] is here to discuss her progress with her obesity treatment plan along with follow-up of her obesity related diagnoses. [name] is on the Category 3 Plan and states she is following her eating plan approximately 90% of the time. [name] states she is exercising 0 minutes 0 times per week.  Today's visit was #: 95 Starting weight: 220 lbs Starting date: 01/19/2018 Today's weight: 197 lbs Today's date: 09/23/2019 Total lbs lost to date: 23 Total lbs lost since last in-office visit: 0  Interim History: [name] states that she believes she may be under eating her protein. She is not yet walking for exercise due to issues with her back and bursitis of her hip.  Subjective:   Type 2 diabetes mellitus without complication, without long-term current use of insulin ([place]). Fasting blood sugars 109-137. [name] is on metformin. No nausea, vomiting, or diarrhea. No polyphagia. No hypoglycemia.   Lab Results  Component Value Date   HGBA1C 6.1 06/08/2019   HGBA1C 5.7 (A) 05/24/2019   HGBA1C 5.7 (A) 01/05/2019   Lab Results  Component Value Date   MICROALBUR <0.7 01/05/2019   LDLCALC 64 06/08/2019   CREATININE 0.65 08/03/2019   Lab Results  Component Value Date   INSULIN 12.1 01/19/2018   OSA on CPAP. [name] is using CPAP nightly. She has not had a refitting or recheck of her settings in "years."  Assessment/Plan:   Type 2 diabetes mellitus without complication, without long-term current use of insulin ([name]). Good blood sugar control is important to decrease the likelihood of diabetic complications such as nephropathy, neuropathy, limb loss, blindness, coronary artery disease, and death. Intensive lifestyle modification including diet, exercise and weight loss are the first line of treatment for diabetes. [name] will continue her medications as directed.  OSA on CPAP. Intensive lifestyle modifications are the first line  treatment for this issue. We discussed several lifestyle modifications today and she will continue to work on diet, exercise and weight loss efforts. We will continue to monitor. Orders and follow up as documented in patient record. Zani will call Pulmonology for follow-up.  Counseling  Sleep apnea is a condition in which breathing pauses or becomes shallow during sleep. This happens over and over during the night. This disrupts your sleep and keeps your body from getting the rest that it needs, which can cause tiredness and lack of energy (fatigue) during the day.  Sleep apnea treatment: If you were given a device to open your airway while you sleep, USE IT!  Sleep hygiene:   Limit or avoid alcohol, caffeinated beverages, and cigarettes, especially close to bedtime.   Do not eat a large meal or eat spicy foods right before bedtime. This can lead to digestive discomfort that can make it hard for you to sleep.  Keep a sleep diary to help you and your health care provider figure out what could be causing your insomnia.  . Make your bedroom a dark, comfortable place where it is easy to fall asleep. ? Put up shades or blackout curtains to block light from outside. ? Use a white noise machine to block noise. ? Keep the temperature cool. . Limit screen use before bedtime. This includes: ? Watching TV. ? Using your smartphone, tablet, or computer. . Stick to a routine that includes going to bed and waking up at the same times every day and night. This can help you fall asleep faster. Consider making a quiet activity, such  as reading, part of your nighttime routine. . Try to avoid taking naps during the day so that you sleep better at night. . Get out of bed if you are still awake after 15 minutes of trying to sleep. Keep the lights down, but try reading or doing a quiet activity. When you feel sleepy, go back to bed.  Class 1 obesity with serious comorbidity and body mass index (BMI) of 31.0  to 31.9 in adult, unspecified obesity type.  [name] is currently in the action stage of change. As such, her goal is to continue with weight loss efforts. She has agreed to the Category 3 Plan.   Exercise goals: Older adults should follow the adult guidelines. When older adults cannot meet the adult guidelines, they should be as physically active as their abilities and conditions will allow.   Behavioral modification strategies: meal planning and cooking strategies and keeping healthy foods in the home.  [name] has agreed to follow-up with our clinic in 3 weeks. She was informed of the importance of frequent follow-up visits to maximize her success with intensive lifestyle modifications for her multiple health conditions.   Objective:   Blood pressure (!) 160/64, pulse (!) 50, temperature 99 F (37.2 C), temperature source Oral, height 5\' 6"  (1.676 m), weight 197 lb (89.4 kg), SpO2 98 %. Body mass index is 31.8 kg/m.  General: Cooperative, alert, well developed, in no acute distress. HEENT: Conjunctivae and lids unremarkable. Cardiovascular: Regular rhythm.  Lungs: Normal work of breathing. Neurologic: No focal deficits.   Lab Results  Component Value Date   CREATININE 0.65 08/03/2019   BUN 22 08/03/2019   NA 127 (L) 08/03/2019   K 4.4 08/03/2019   CL 95 (L) 08/03/2019   CO2 27 08/03/2019   Lab Results  Component Value Date   ALT 14 06/08/2019   AST 19 06/08/2019   ALKPHOS 192 (H) 08/03/2019   BILITOT 0.4 06/08/2019   Lab Results  Component Value Date   HGBA1C 6.1 06/08/2019   HGBA1C 5.7 (A) 05/24/2019   HGBA1C 5.7 (A) 01/05/2019   HGBA1C 6.1 06/04/2018   HGBA1C 6.6 (H) 01/19/2018   Lab Results  Component Value Date   INSULIN 12.1 01/19/2018   Lab Results  Component Value Date   TSH 3.38 06/04/2018   Lab Results  Component Value Date   CHOL 131 06/08/2019   HDL 53.90 06/08/2019   LDLCALC 64 06/08/2019   LDLDIRECT 58.0 11/25/2017   TRIG 65.0  06/08/2019   CHOLHDL 2 06/08/2019   Lab Results  Component Value Date   WBC 8.0 06/08/2019   HGB 12.4 06/08/2019   HCT 37.9 06/08/2019   MCV 85.3 06/08/2019   PLT 270.0 06/08/2019   No results found for: IRON, TIBC, FERRITIN  Obesity Behavioral Intervention Documentation for Insurance:   Approximately 15 minutes were spent on the discussion below.  ASK: We discussed the diagnosis of obesity with [name] today and Laloni agreed to give [name] permission to discuss obesity behavioral modification therapy today.  ASSESS: Markeda has the diagnosis of obesity and her BMI today is 31.9. Noa is in the action stage of change.   ADVISE: Ahnesty was educated on the multiple health risks of obesity as well as the benefit of weight loss to improve her health. She was advised of the need for long term treatment and the importance of lifestyle modifications to improve her current health and to decrease her risk of future health problems.  AGREE: Multiple dietary modification options  and treatment options were discussed and Vincentina agreed to follow the recommendations documented in the above note.  ARRANGE: [place] was educated on the importance of frequent visits to treat obesity as outlined per CMS and USPSTF guidelines and agreed to schedule her next follow up appointment today.  Attestation Statements:   Reviewed by clinician on day of visit: allergies, medications, problem list, medical history, surgical history, family history, social history, and previous encounter notes.  IMichaelene Song, am acting as transcriptionist for [name], PA-C   I have reviewed the above documentation for accuracy and completeness, and I agree with the above. [name], PA-C

## 2019-09-28 ENCOUNTER — Other Ambulatory Visit: Payer: Self-pay

## 2019-09-28 ENCOUNTER — Ambulatory Visit: Payer: Medicare PPO | Admitting: Endocrinology

## 2019-09-28 ENCOUNTER — Encounter: Payer: Self-pay | Admitting: Endocrinology

## 2019-09-28 VITALS — BP 104/72 | HR 47 | Ht 66.0 in | Wt 203.0 lb

## 2019-09-28 DIAGNOSIS — E119 Type 2 diabetes mellitus without complications: Secondary | ICD-10-CM

## 2019-09-28 DIAGNOSIS — E871 Hypo-osmolality and hyponatremia: Secondary | ICD-10-CM | POA: Diagnosis not present

## 2019-09-28 LAB — HEPATIC FUNCTION PANEL
ALT: 16 U/L (ref 0–35)
AST: 21 U/L (ref 0–37)
Albumin: 4.4 g/dL (ref 3.5–5.2)
Alkaline Phosphatase: 160 U/L — ABNORMAL HIGH (ref 39–117)
Bilirubin, Direct: 0.1 mg/dL (ref 0.0–0.3)
Total Bilirubin: 0.5 mg/dL (ref 0.2–1.2)
Total Protein: 6.7 g/dL (ref 6.0–8.3)

## 2019-09-28 NOTE — Patient Instructions (Addendum)
Blood tests are requested for you today.  We'll let you know about the results.  If the sodium is low again, we'll add salt tablets.  Your heart is slow today.  Please see your primary care provider soon, to have it rechecked.   Please come back for a follow-up appointment in 3 months.

## 2019-09-28 NOTE — Progress Notes (Signed)
Subjective:    Patient ID: [name], female    DOB: 1950/08/12, 69 y.o.   MRN: [id number]  HPI Pt returns for f/u of hyponatremia (dx'ed 2013; only cause found was metaclopramide, which she cannot safely stop; she takes demeclocycline; ACTH stim test, creat, and TFT were normal; MRI showed old CVA's).  Since declomycin was increased, pt states she feels no different, and well in general.   Pt also has h/o elev AP (etiol is uncertain; isoenzymes suggested bone cause; bone scan showed multifocal degenerative changes. No evidence of acute or destructive process). Past Medical History:  Diagnosis Date  . Allergy   . Anxiety   . Arthritis    back & knee  . Asthma     mild per pt shows up with resp illness  . Chronic diastolic congestive heart failure ([place])   . Colon polyps   . Constipation   . Diabetes mellitus without complication ([place])   . Dyspnea   . Gastric polyps   . Gastroparesis   . GERD (gastroesophageal reflux disease)   . Headache    sinus headaches and migraines at times  . Heart murmur   . History of migraine headaches   . HTN (hypertension)   . Hyperlipidemia   . IBS (irritable bowel syndrome)   . Joint pain   . Lumbar disc disease   . S/P aortic valve replacement with bioprosthetic valve 08/23/2016   25 mm Edwards Intuity rapid-deployment bovine pericardial tissue valve via partial upper mini sternotomy  . Sleep apnea   . TIA (transient ischemic attack)     Past Surgical History:  Procedure Laterality Date  . ABDOMINAL HYSTERECTOMY    . AORTIC VALVE REPLACEMENT N/A 08/23/2016   Procedure: AORTIC VALVE REPLACEMENT (AVR) - using partial Upper Sternotomy- 73mm Edwards Intuity Aortic Valve used;  Surgeon: Rexene Alberts, MD;  Location: [place];  Service: Open Heart Surgery;  Laterality: N/A;  . BLADDER SUSPENSION  2007  . BREAST ENHANCEMENT SURGERY  1980  . CARPAL TUNNEL RELEASE Bilateral   . COLONOSCOPY    . DORSAL COMPARTMENT RELEASE Right 09/15/2014    Procedure: RIGHT WRIST DEQUERVAINS RELEASE ;  Surgeon: Kathryne Hitch, MD;  Location: [place];  Service: Orthopedics;  Laterality: Right;  . ESOPHAGOGASTRODUODENOSCOPY    . KNEE ARTHROSCOPY  04/15/2012   Procedure: ARTHROSCOPY KNEE;  Surgeon: [name], MD;  Location: [place] [facility];  Service: Orthopedics;  Laterality: Right;  . LAPAROSCOPIC CHOLECYSTECTOMY    . LASIK    . OOPHORECTOMY    . PALATE TO GINGIVA GRAFT  2017  . RIGHT/LEFT HEART CATH AND CORONARY ANGIOGRAPHY N/A 08/02/2016   Procedure: Right/Left Heart Cath and Coronary Angiography;  Surgeon: Sherren Mocha, MD;  Location: [facility];  Service: Cardiovascular;  Laterality: N/A;  . ROTATOR CUFF REPAIR Left   . TEE WITHOUT CARDIOVERSION N/A 08/23/2016   Procedure: TRANSESOPHAGEAL ECHOCARDIOGRAM (TEE);  Surgeon: Rexene Alberts, MD;  Location: [place];  Service: Open Heart Surgery;  Laterality: N/A;  . TOTAL KNEE ARTHROPLASTY  04/15/2012   Procedure: TOTAL KNEE ARTHROPLASTY;  Surgeon: [name], MD;  Location: [facility];  Service: Orthopedics;  Laterality: Right;  . TRIGGER FINGER RELEASE Right 04/20/2015   Procedure: RIGHT TRIGGER FINGER RELEASE (TENDON SHEALTH INCISION) ;  Surgeon: [name], MD;  Location: [place];  Service: Orthopedics;  Laterality: Right;    Social History   Socioeconomic History  . Marital status: Married  Spouse name: [name]  . Number of children: 1  . Years of education: Not on file  . Highest education level: Not on file  Occupational History  . Occupation: Retired, Curator: RETIRED  Tobacco Use  . Smoking status: Never Smoker  . Smokeless tobacco: Never Used  Substance and Sexual Activity  . Alcohol use: No  . Drug use: No  . Sexual activity: Yes  Other Topics Concern  . Not on file  Social History Narrative   She lives with husband (1978) and two dogs. Step-son [name] (1971-psychiatrist in [place]). No grandkids. 2  dogs-sheltie/collie mix and border collie mix      Highest level of education:  Master in education   She is retired Animal nutritionist x 32 years.      Hobbies: time with dogs   Right handed   One story home         Social Determinants of Health   Financial Resource Strain:   . Difficulty of Paying Living Expenses:   Food Insecurity:   . Worried About Charity fundraiser in the Last Year:   . Arboriculturist in the Last Year:   Transportation Needs:   . Film/video editor (Medical):   [name] Lack of Transportation (Non-Medical):   Physical Activity:   . Days of Exercise per Week:   . Minutes of Exercise per Session:   Stress:   . Feeling of Stress :   Social Connections:   . Frequency of Communication with Friends and Family:   . Frequency of Social Gatherings with Friends and Family:   . Attends Religious Services:   . Active Member of Clubs or Organizations:   . Attends Archivist Meetings:   [name] Marital Status:   Intimate Partner Violence:   . Fear of Current or Ex-Partner:   . Emotionally Abused:   [name] Physically Abused:   . Sexually Abused:     Current Outpatient Medications on File Prior to Visit  Medication Sig Dispense Refill  . Accu-Chek Softclix Lancets lancets Use as instructed 100 each 12  . atorvastatin (LIPITOR) 40 MG tablet TAKE 1 TABLET (40 MG TOTAL) BY MOUTH DAILY AT 6 PM. 90 tablet 3  . Biotin 5000 MCG TABS Take 5,000 mcg by mouth daily.     . calcium-vitamin D (OSCAL WITH D) 500-200 MG-UNIT per tablet Take 2 tablets by mouth daily.     . Cholecalciferol (VITAMIN D) 125 MCG (5000 UT) CAPS Take 5,000 Units by mouth daily.    . clopidogrel (PLAVIX) 75 MG tablet Take 1 tablet (75 mg total) by mouth daily. 90 tablet 3  . demeclocycline (DECLOMYCIN) 300 MG tablet Take 1 tablet (300 mg total) by mouth 2 (two) times daily. 180 tablet 3  . fluticasone (FLONASE) 50 MCG/ACT nasal spray Place 2 sprays into both nostrils daily.     [name] glucose blood test  strip Use to test blood sugars daily. Dx: E11.9 100 each 12  . ipratropium (ATROVENT) 0.03 % nasal spray 2 SPRAYS IN EACH NOSTRIL AS NEEDED THREE TIMES AS NEEDED    . loratadine (CLARITIN) 10 MG tablet Take 10 mg by mouth at bedtime.     . Melatonin 5 MG TABS Take by mouth.    . metFORMIN (GLUCOPHAGE) 500 MG tablet TAKE ONE TABLET BY MOUTH WITH BREAKFAST AND 1/2-1 WITH DINNER 180 tablet 1  . metoCLOPramide (REGLAN) 10 MG tablet TAKE 1/2 TABLET BY MOUTH  4 TIMES A DAY 60 tablet 3  . metoprolol tartrate (LOPRESSOR) 25 MG tablet TAKE 0.5 TABLETS (12.5 MG TOTAL) BY MOUTH 2 (TWO) TIMES DAILY. 90 tablet 1  . Multiple Vitamin (MULTIVITAMIN WITH MINERALS) TABS tablet Take 1 tablet by mouth daily.    . naproxen sodium (ALEVE) 220 MG tablet Take 220 mg by mouth.    . OMEPRAZOLE MAGNESIUM PO Take 40 mg by mouth daily with breakfast.     . Probiotic Product (ALIGN) 4 MG CAPS Take 4 mg by mouth daily.     . psyllium (METAMUCIL SMOOTH TEXTURE) 28 % packet Take 1 packet by mouth daily before breakfast.     . venlafaxine XR (EFFEXOR-XR) 75 MG 24 hr capsule TAKE 1 CAPSULE (75 MG TOTAL) BY MOUTH DAILY WITH BREAKFAST. 90 capsule 2   No current facility-administered medications on file prior to visit.    Allergies  Allergen Reactions  . Augmentin [Amoxicillin-Pot Clavulanate] Rash    rash  . Erythromycin Nausea Only  . Penicillins Itching and Rash    Has patient had a PCN reaction causing immediate rash, facial/tongue/throat swelling, SOB or lightheadedness with hypotension: No Has patient had a PCN reaction causing severe rash involving mucus membranes or skin necrosis: No Has patient had a PCN reaction that required hospitalization: No Has patient had a PCN reaction occurring within the last 10 years: No  If all of the above answers are "NO", then may proceed with Cephalosporin use.     Family History  Problem Relation Age of Onset  . COPD Mother   . Colon polyps Mother   . Irritable bowel syndrome  Mother   . Anxiety disorder Mother   . Heart disease Father   . Alcohol abuse Father   . Colon polyps Maternal Aunt   . Other Neg Hx        hyponatremia    BP 104/72   Pulse (!) 47   Ht 5\' 6"  (1.676 m)   Wt 203 lb (92.1 kg)   LMP  (LMP Unknown)   SpO2 99%   BMI 32.77 kg/m    Review of Systems Denies nausea.      Objective:   Physical Exam VITAL SIGNS:  See vs page GENERAL: no distress Ext: no leg edema.  Lab Results  Component Value Date   ALT 16 09/28/2019   AST 21 09/28/2019   ALKPHOS 160 (H) 09/28/2019   BILITOT 0.5 09/28/2019       Assessment & Plan:  Hyperphosphatasia: improved.  We'll follow.  Hyponatremia: recheck today.  Bradycardia: is noted today.   Patient Instructions  Blood tests are requested for you today.  We'll let you know about the results.  If the sodium is low again, we'll add salt tablets.  Your heart is slow today.  Please see your primary care provider soon, to have it rechecked.   Please come back for a follow-up appointment in 3 months.

## 2019-09-29 ENCOUNTER — Ambulatory Visit: Payer: Self-pay | Admitting: Cardiology

## 2019-09-30 ENCOUNTER — Encounter: Payer: Self-pay | Admitting: Endocrinology

## 2019-10-01 ENCOUNTER — Other Ambulatory Visit: Payer: Self-pay

## 2019-10-01 DIAGNOSIS — E871 Hypo-osmolality and hyponatremia: Secondary | ICD-10-CM

## 2019-10-04 DIAGNOSIS — H2513 Age-related nuclear cataract, bilateral: Secondary | ICD-10-CM | POA: Diagnosis not present

## 2019-10-04 DIAGNOSIS — H25043 Posterior subcapsular polar age-related cataract, bilateral: Secondary | ICD-10-CM | POA: Diagnosis not present

## 2019-10-05 ENCOUNTER — Other Ambulatory Visit: Payer: Self-pay

## 2019-10-05 ENCOUNTER — Encounter (INDEPENDENT_AMBULATORY_CARE_PROVIDER_SITE_OTHER): Payer: Self-pay | Admitting: Physician Assistant

## 2019-10-05 ENCOUNTER — Encounter: Payer: Self-pay | Admitting: Cardiology

## 2019-10-05 ENCOUNTER — Ambulatory Visit: Payer: Medicare PPO | Admitting: Cardiology

## 2019-10-05 VITALS — BP 132/78 | HR 50 | Ht 66.0 in | Wt 204.8 lb

## 2019-10-05 DIAGNOSIS — E782 Mixed hyperlipidemia: Secondary | ICD-10-CM

## 2019-10-05 DIAGNOSIS — Z953 Presence of xenogenic heart valve: Secondary | ICD-10-CM | POA: Diagnosis not present

## 2019-10-05 DIAGNOSIS — R001 Bradycardia, unspecified: Secondary | ICD-10-CM

## 2019-10-05 DIAGNOSIS — I10 Essential (primary) hypertension: Secondary | ICD-10-CM | POA: Diagnosis not present

## 2019-10-05 DIAGNOSIS — I152 Hypertension secondary to endocrine disorders: Secondary | ICD-10-CM

## 2019-10-05 DIAGNOSIS — E1159 Type 2 diabetes mellitus with other circulatory complications: Secondary | ICD-10-CM | POA: Diagnosis not present

## 2019-10-05 MED ORDER — LOSARTAN POTASSIUM 25 MG PO TABS: 25.0000 mg | ORAL_TABLET | Freq: Every day | ORAL | 2 refills | Status: DC

## 2019-10-05 NOTE — Patient Instructions (Signed)
Medication Instructions:   STOP TAKING METOPROLOL NOW  START TAKING LOSARTAN 25 MG BY MOUTH DAILY  *If you need a refill on your cardiac medications before your next appointment, please call your pharmacy*    Follow-Up: At [facility], you and your health needs are our priority.  As part of our continuing mission to provide you with exceptional heart care, we have created designated Provider Care Teams.  These Care Teams include your primary Cardiologist (physician) and Advanced Practice Providers (APPs -  Physician Assistants and Nurse Practitioners) who all work together to provide you with the care you need, when you need it.  We recommend signing up for the patient portal called "MyChart".  Sign up information is provided on this After Visit Summary.  MyChart is used to connect with patients for Virtual Visits (Telemedicine).  Patients are able to view lab/test results, encounter notes, upcoming appointments, etc.  Non-urgent messages can be sent to your provider as well.   To learn more about what you can do with MyChart, go to [email or web address].    Your next appointment:   6 month(s)  The format for your next appointment:   In Person  Provider:   Ena Dawley, MD

## 2019-10-05 NOTE — Progress Notes (Signed)
Cardiology Office Note:    Date:  10/05/2019   ID:  CRYSTRAL [name], DOB [birth year], MRN [id number]  PCP:  Hayley Olp, MD  Cardiologist:  Dr [name]  Referring MD: Hayley Olp, MD   Reason for visit: 6 months follow-up History of Present Illness:    [name] is a 69 y.o. female with a hx of Diabetes, hyperlipidemia, OSA on CPAP, and aortic valve replacement with bioprosthetic bovine pericardial tissue valve 08/23/16. Patient had only minor nonobstructive CAD on heart catheterization 08/02/16 done in preparation for AVR. Also right heart cath showed normal right heart hemodynamics. Echocardiogram showed normal EF of 60-65% and grade 1 diastolic dysfunction. Pt is feeling very well and much better after the valve replacement than before. Completed 8 weeks of cardiac rehab and working out at [employer] (has taken a week off due to having a mole on her leg removed).  June 12, 2018 -the patient is coming after 1 year, she is feeling and looking great, she completed cardiac rehab however is not exercising on a regular basis now.  She joined the wellness clinic at [facility] and has lost 25 pounds so far but still wants to lose another 30.  She denies any chest pain, shortness of breath, no orthopnea or proximal nocturnal dyspnea.  She has been compliant with her meds and has no side effects.  03/11/2019 -this is 9 months follow-up, the patient is doing great, she started house diet program: Where she is following a diet and exercise regimen and was able to lose 30 pounds in the last 6 months.  She denies any chest pain or shortness of breath.  She denies any lower extremity edema orthopnea proximal nocturnal dyspnea.  She has been compliant with her meds and has no side effects.  10/05/2019 -the patient is coming after 6 months, she has been doing great, she denies any chest pain or shortness of breath.  She has been compliant with her medications and has no side effects.  Her  only complaint ischiatic pain and bursitis in her left hip for which she gets steroid injections.  She denies any dizziness presyncope or syncope.  Past Medical History:  Diagnosis Date  . Allergy   . Anxiety   . Arthritis    back & knee  . Asthma     mild per pt shows up with resp illness  . Chronic diastolic congestive heart failure ([place])   . Colon polyps   . Constipation   . Diabetes mellitus without complication ([name])   . Dyspnea   . Gastric polyps   . Gastroparesis   . GERD (gastroesophageal reflux disease)   . Headache    sinus headaches and migraines at times  . Heart murmur   . History of migraine headaches   . HTN (hypertension)   . Hyperlipidemia   . IBS (irritable bowel syndrome)   . Joint pain   . Lumbar disc disease   . S/P aortic valve replacement with bioprosthetic valve 08/23/2016   25 mm Edwards Intuity rapid-deployment bovine pericardial tissue valve via partial upper mini sternotomy  . Sleep apnea   . TIA (transient ischemic attack)     Past Surgical History:  Procedure Laterality Date  . ABDOMINAL HYSTERECTOMY    . AORTIC VALVE REPLACEMENT N/A 08/23/2016   Procedure: AORTIC VALVE REPLACEMENT (AVR) - using partial Upper Sternotomy- 1mm Edwards Intuity Aortic Valve used;  Surgeon: Hayley Alberts, MD;  Location: [place];  Service:  Open Heart Surgery;  Laterality: N/A;  . BLADDER SUSPENSION  2007  . BREAST ENHANCEMENT SURGERY  1980  . CARPAL TUNNEL RELEASE Bilateral   . COLONOSCOPY    . DORSAL COMPARTMENT RELEASE Right 09/15/2014   Procedure: RIGHT WRIST DEQUERVAINS RELEASE ;  Surgeon: Hayley Hitch, MD;  Location: [place];  Service: Orthopedics;  Laterality: Right;  . ESOPHAGOGASTRODUODENOSCOPY    . KNEE ARTHROSCOPY  04/15/2012   Procedure: ARTHROSCOPY KNEE;  Surgeon: Hayley Lights, MD;  Location: [place];  Service: Orthopedics;  Laterality: Right;  . LAPAROSCOPIC CHOLECYSTECTOMY    . LASIK    . OOPHORECTOMY    . PALATE TO GINGIVA GRAFT   2017  . RIGHT/LEFT HEART CATH AND CORONARY ANGIOGRAPHY N/A 08/02/2016   Procedure: Right/Left Heart Cath and Coronary Angiography;  Surgeon: Hayley Mocha, MD;  Location: [place] [facility];  Service: Cardiovascular;  Laterality: N/A;  . ROTATOR CUFF REPAIR Left   . TEE WITHOUT CARDIOVERSION N/A 08/23/2016   Procedure: TRANSESOPHAGEAL ECHOCARDIOGRAM (TEE);  Surgeon: Hayley Alberts, MD;  Location: [place];  Service: Open Heart Surgery;  Laterality: N/A;  . TOTAL KNEE ARTHROPLASTY  04/15/2012   Procedure: TOTAL KNEE ARTHROPLASTY;  Surgeon: Hayley Lights, MD;  Location: [place];  Service: Orthopedics;  Laterality: Right;  . TRIGGER FINGER RELEASE Right 04/20/2015   Procedure: RIGHT TRIGGER FINGER RELEASE (TENDON SHEALTH INCISION) ;  Surgeon: Hayley Lights, MD;  Location: [place];  Service: Orthopedics;  Laterality: Right;    Current Medications: Current Meds  Medication Sig  . Accu-Chek Softclix Lancets lancets Use as instructed  . atorvastatin (LIPITOR) 40 MG tablet TAKE 1 TABLET (40 MG TOTAL) BY MOUTH DAILY AT 6 PM.  . Biotin 5000 MCG TABS Take 5,000 mcg by mouth daily.   . calcium-vitamin D (OSCAL WITH D) 500-200 MG-UNIT per tablet Take 2 tablets by mouth daily.   . Cholecalciferol (VITAMIN D) 125 MCG (5000 UT) CAPS Take 5,000 Units by mouth daily.  . clopidogrel (PLAVIX) 75 MG tablet Take 1 tablet (75 mg total) by mouth daily.  [name] demeclocycline (DECLOMYCIN) 300 MG tablet Take 1 tablet (300 mg total) by mouth 2 (two) times daily.  . fluticasone (FLONASE) 50 MCG/ACT nasal spray Place 2 sprays into both nostrils daily.   [name] glucose blood test strip Use to test blood sugars daily. Dx: E11.9  . ipratropium (ATROVENT) 0.03 % nasal spray 2 SPRAYS IN EACH NOSTRIL AS NEEDED THREE TIMES AS NEEDED  . loratadine (CLARITIN) 10 MG tablet Take 10 mg by mouth at bedtime.   . Melatonin 5 MG TABS Take by mouth.  . metFORMIN (GLUCOPHAGE) 500 MG tablet TAKE ONE TABLET BY MOUTH WITH BREAKFAST  AND 1/2-1 WITH DINNER  . metoCLOPramide (REGLAN) 10 MG tablet TAKE 1/2 TABLET BY MOUTH 4 TIMES A DAY  . Multiple Vitamin (MULTIVITAMIN WITH MINERALS) TABS tablet Take 1 tablet by mouth daily.  . naproxen sodium (ALEVE) 220 MG tablet Take 220 mg by mouth.  . OMEPRAZOLE MAGNESIUM PO Take 40 mg by mouth daily with breakfast.   . Probiotic Product (ALIGN) 4 MG CAPS Take 4 mg by mouth daily.   . psyllium (METAMUCIL SMOOTH TEXTURE) 28 % packet Take 1 packet by mouth daily before breakfast.   . venlafaxine XR (EFFEXOR-XR) 75 MG 24 hr capsule TAKE 1 CAPSULE (75 MG TOTAL) BY MOUTH DAILY WITH BREAKFAST.  . [DISCONTINUED] metoprolol tartrate (LOPRESSOR) 25 MG tablet TAKE 0.5 TABLETS (12.5 MG TOTAL) BY MOUTH  2 (TWO) TIMES DAILY.     Allergies:   Augmentin [amoxicillin-pot clavulanate], Erythromycin, and Penicillins   Social History   Socioeconomic History  . Marital status: Married    Spouse name: [name]  . Number of children: 1  . Years of education: Not on file  . Highest education level: Not on file  Occupational History  . Occupation: Retired, Curator: RETIRED  Tobacco Use  . Smoking status: Never Smoker  . Smokeless tobacco: Never Used  Substance and Sexual Activity  . Alcohol use: No  . Drug use: No  . Sexual activity: Yes  Other Topics Concern  . Not on file  Social History Narrative   She lives with husband (1978) and two dogs. Step-son [name] (1971-psychiatrist in [place]). No grandkids. 2 dogs-sheltie/collie mix and border collie mix      Highest level of education:  Master in education   She is retired Animal nutritionist x 32 years.      Hobbies: time with dogs   Right handed   One story home         Social Determinants of Health   Financial Resource Strain:   . Difficulty of Paying Living Expenses:   Food Insecurity:   . Worried About Charity fundraiser in the Last Year:   . Arboriculturist in the Last Year:   Transportation Needs:   .  Film/video editor (Medical):   [name] Lack of Transportation (Non-Medical):   Physical Activity:   . Days of Exercise per Week:   . Minutes of Exercise per Session:   Stress:   . Feeling of Stress :   Social Connections:   . Frequency of Communication with Friends and Family:   . Frequency of Social Gatherings with Friends and Family:   . Attends Religious Services:   . Active Member of Clubs or Organizations:   . Attends Archivist Meetings:   [name] Marital Status:      Family History: The patient's family history includes Alcohol abuse in her father; Anxiety disorder in her mother; COPD in her mother; Colon polyps in her maternal aunt and mother; Heart disease in her father; Irritable bowel syndrome in her mother. There is no history of Other. ROS:   Please see the history of present illness.     All other systems reviewed and are negative.  EKGs/Labs/Other Studies Reviewed:    The following studies were reviewed today:  Right/Left Heart Cath and Coronary Angiography 08/02/16   1. Minor nonobstructive CAD 2. Normal right heart hemodynamics (including normal PCWP) 3. Known severe aortic stenosis    Intraoperative TEE 08/23/16  Left ventricle: Normal cavity size. Concentric hypertrophy of moderate severity. LV systolic function is normal with an EF of 60-65%. There are no obvious wall motion abnormalities.  Septum: No Patent Foramen Ovale present.  Left atrium: Patent foramen ovale not present.  Aortic valve: The valve is bicuspid. Severe valve thickening present. Severe valve calcification present. Moderately decreased leaflet separation. Severe stenosis. No regurgitation. No AV vegetation. No evidence of papillary fibroelastoma.  Aorta: The ascending aorta is mildly dilated.  Mitral valve: No leaflet thickening and calcification present. Mild mitral annular calcification.  Right ventricle: Normal cavity size, wall thickness and ejection fraction. Tricuspid valve:  Trace regurgitation. The tricuspid valve regurgitation jet is central.   TTE: 08/26/2017   Left ventricle: The cavity size was mildly dilated. Wall   thickness was increased in a  pattern of mild LVH. Systolic   function was normal. The estimated ejection fraction was in the   range of 60% to 65%. Left ventricular diastolic function   parameters were normal. - Aortic valve: patient had a Edwards 25 mm Intuity Elite rapid   deployment bovine pericardial valve placed 09/12/16. 05/01/17 mean   gradient was 6 mmHg peak gradient 12 mmHg. Gradients have   increased now 17 mmHg and 34 mmHg peak. There is a mild leak seen   through the basal stented portion of the valve appears at around   10:00 on BSA views - Mitral valve: There was mild regurgitation. - Atrial septum: No defect or patent foramen ovale was identified.  TTE12/21/2020   1. Left ventricular ejection fraction, by visual estimation, is 60 to  65%. The left ventricle has normal function. There is no left ventricular  hypertrophy.  2. Abnormal septal motion consistent with post-operative status.  3. The left ventricle has no regional wall motion abnormalities.  4. Global right ventricle has normal systolic function.The right  ventricular size is normal. No increase in right ventricular wall  thickness.  5. Left atrial size was mildly dilated.  6. Right atrial size was normal.  7. Bioprosthetic aortic valve with previously described mild perivalvular  leak. Normal prosthetic gradients.  8. The mitral valve is normal in structure. Trivial mitral valve  regurgitation. No evidence of mitral stenosis.  9. The tricuspid valve is normal in structure. Tricuspid valve  regurgitation is not demonstrated.  10. The pulmonic valve was normal in structure. Pulmonic valve  regurgitation is not visualized.  11. The inferior vena cava is normal in size with greater than 50%  respiratory variability, suggesting right atrial pressure of 3  mmHg.  12. Aortic prosthesis gradients are lower, otherwise no change since  08/26/2017.  13. Prior images reviewed side by side.   EKG:  EKG is ordered today and personally reviewed, which shows sinus bradycardia 58 bpm, LVH unchanged from prior.    Recent Labs: 06/08/2019: Hemoglobin 12.4; Platelets 270.0 08/03/2019: BUN 22; Creatinine, Ser 0.65; Potassium 4.4; Sodium 127 09/28/2019: ALT 16   Recent Lipid Panel    Component Value Date/Time   CHOL 131 06/08/2019 0949   CHOL 122 02/11/2019 1103   TRIG 65.0 06/08/2019 0949   HDL 53.90 06/08/2019 0949   HDL 55 02/11/2019 1103   CHOLHDL 2 06/08/2019 0949   VLDL 13.0 06/08/2019 0949   LDLCALC 64 06/08/2019 0949   LDLCALC 55 02/11/2019 1103   LDLDIRECT 58.0 11/25/2017 1156   Physical Exam:    VS:  BP 132/78   Pulse (!) 50   Ht 5\' 6"  (1.676 m)   Wt 204 lb 12.8 oz (92.9 kg)   LMP  (LMP Unknown)   SpO2 99%   BMI 33.06 kg/m     Wt Readings from Last 3 Encounters:  10/05/19 204 lb 12.8 oz (92.9 kg)  09/28/19 203 lb (92.1 kg)  09/23/19 197 lb (89.4 kg)    GEN: Well nourished, well developed in no acute distress HEENT: Normal NECK: No JVD; No carotid bruits LYMPHATICS: No lymphadenopathy CARDIAC: RRR, 3 out of 6 systolic and 2 out of 6 diastolic murmur, rubs, gallops RESPIRATORY:  Clear to auscultation without rales, wheezing or rhonchi  ABDOMEN: Soft, non-tender, non-distended MUSCULOSKELETAL:  No edema; No deformity  SKIN: Warm and dry NEUROLOGIC:  Alert and oriented x 3 PSYCHIATRIC:  Normal affect    ASSESSMENT:    1. Hypertension associated with diabetes (  [place])   2. S/P aortic valve replacement with bioprosthetic valve   3. Mixed hyperlipidemia   4. Essential hypertension      PLAN:    In order of problems listed above:  S/P AVR with bovine tissue valve 08/23/16 -The patient continues to exercise and has lost 30 pounds since surgery.   -Her most recent echocardiogram in December 2020 showed normal transaortic  gradients with mean 8 mmHg and still have them help for her heart remotely.  Sinus bradycardia - heart rate 46 today, I will discontinue metoprolol and start losartan 25 mg daily.  Chronic diastolic CHF -She is euvolemic off Lasix, functional status much improved with exercise and weight loss.  Hyperlipidemia -All lipids at goal on atorvastatin.  OSA -Using CPAP consistently every night.   Hypertension -Well-controlled on metoprolol  Medication Adjustments/Labs and Tests Ordered: Current medicines are reviewed at length with the patient today.  Concerns regarding medicines are outlined above. Labs and tests ordered and medication changes are outlined in the patient instructions below:  Patient Instructions  Medication Instructions:   STOP TAKING METOPROLOL NOW  START TAKING LOSARTAN 25 MG BY MOUTH DAILY  *If you need a refill on your cardiac medications before your next appointment, please call your pharmacy*    Follow-Up: At [facility], you and your health needs are our priority.  As part of our continuing mission to provide you with exceptional heart care, we have created designated Provider Care Teams.  These Care Teams include your primary Cardiologist (physician) and Advanced Practice Providers (APPs -  Physician Assistants and Nurse Practitioners) who all work together to provide you with the care you need, when you need it.  We recommend signing up for the patient portal called "MyChart".  Sign up information is provided on this After Visit Summary.  MyChart is used to connect with patients for Virtual Visits (Telemedicine).  Patients are able to view lab/test results, encounter notes, upcoming appointments, etc.  Non-urgent messages can be sent to your provider as well.   To learn more about what you can do with MyChart, go to [email or web address].    Your next appointment:   6 month(s)  The format for your next appointment:   In Person  Provider:   Ena Dawley, MD        Signed, Ena Dawley, MD  10/05/2019 3:19 PM    [place]

## 2019-10-07 DIAGNOSIS — D2261 Melanocytic nevi of right upper limb, including shoulder: Secondary | ICD-10-CM | POA: Diagnosis not present

## 2019-10-07 DIAGNOSIS — D225 Melanocytic nevi of trunk: Secondary | ICD-10-CM | POA: Diagnosis not present

## 2019-10-07 DIAGNOSIS — D485 Neoplasm of uncertain behavior of skin: Secondary | ICD-10-CM | POA: Diagnosis not present

## 2019-10-07 DIAGNOSIS — D1801 Hemangioma of skin and subcutaneous tissue: Secondary | ICD-10-CM | POA: Diagnosis not present

## 2019-10-07 DIAGNOSIS — L814 Other melanin hyperpigmentation: Secondary | ICD-10-CM | POA: Diagnosis not present

## 2019-10-07 DIAGNOSIS — L821 Other seborrheic keratosis: Secondary | ICD-10-CM | POA: Diagnosis not present

## 2019-10-07 DIAGNOSIS — L570 Actinic keratosis: Secondary | ICD-10-CM | POA: Diagnosis not present

## 2019-10-07 DIAGNOSIS — D2272 Melanocytic nevi of left lower limb, including hip: Secondary | ICD-10-CM | POA: Diagnosis not present

## 2019-10-07 DIAGNOSIS — D2271 Melanocytic nevi of right lower limb, including hip: Secondary | ICD-10-CM | POA: Diagnosis not present

## 2019-10-07 DIAGNOSIS — D2262 Melanocytic nevi of left upper limb, including shoulder: Secondary | ICD-10-CM | POA: Diagnosis not present

## 2019-10-12 ENCOUNTER — Ambulatory Visit (INDEPENDENT_AMBULATORY_CARE_PROVIDER_SITE_OTHER): Payer: Medicare PPO | Admitting: Physician Assistant

## 2019-10-14 DIAGNOSIS — M5416 Radiculopathy, lumbar region: Secondary | ICD-10-CM | POA: Diagnosis not present

## 2019-10-14 DIAGNOSIS — M545 Low back pain: Secondary | ICD-10-CM | POA: Diagnosis not present

## 2019-10-18 ENCOUNTER — Telehealth: Payer: Self-pay | Admitting: Cardiology

## 2019-10-18 NOTE — Telephone Encounter (Signed)
   [facility] Pre-operative Risk Assessment    Request for surgical clearance:  1. What type of surgery is being performed? Cataract Surgery on Left Eye   2. When is this surgery scheduled? 10/20/19  3. What type of clearance is required (medical clearance vs. Pharmacy clearance to hold med vs. Both)? Medical   4. Are there any medications that need to be held prior to surgery and how long? No   5. Practice name and name of physician performing surgery? [facility], Dr. Rutherford Guys  6. What is your office phone number [phone]   7.   What is your office fax number [phone]  8.   Anesthesia type (None, local, MAC, general) ? [place] 10/18/2019, 1:17 PM  _________________________________________________________________   (provider comments below)

## 2019-10-19 NOTE — Telephone Encounter (Signed)
   Primary Cardiologist: Ena Dawley, MD  Chart reviewed as part of pre-operative protocol coverage. Cataract extractions are recognized in guidelines as low risk surgeries that do not typically require specific preoperative testing or holding of blood thinner therapy. Therefore, given past medical history and time since last visit, based on ACC/AHA guidelines, [name] would be at acceptable risk for the planned procedure without further cardiovascular testing.   I will route this recommendation to the requesting party via Epic fax function and remove from pre-op pool.  Please call with questions.  [place], [place] 10/19/2019, 8:27 AM

## 2019-10-20 DIAGNOSIS — H2511 Age-related nuclear cataract, right eye: Secondary | ICD-10-CM | POA: Diagnosis not present

## 2019-10-20 DIAGNOSIS — H2512 Age-related nuclear cataract, left eye: Secondary | ICD-10-CM | POA: Diagnosis not present

## 2019-10-20 DIAGNOSIS — H25042 Posterior subcapsular polar age-related cataract, left eye: Secondary | ICD-10-CM | POA: Diagnosis not present

## 2019-10-20 DIAGNOSIS — H25041 Posterior subcapsular polar age-related cataract, right eye: Secondary | ICD-10-CM | POA: Diagnosis not present

## 2019-10-22 DIAGNOSIS — M5416 Radiculopathy, lumbar region: Secondary | ICD-10-CM | POA: Diagnosis not present

## 2019-10-22 DIAGNOSIS — M545 Low back pain: Secondary | ICD-10-CM | POA: Diagnosis not present

## 2019-11-17 DIAGNOSIS — H25041 Posterior subcapsular polar age-related cataract, right eye: Secondary | ICD-10-CM | POA: Diagnosis not present

## 2019-11-17 DIAGNOSIS — H2511 Age-related nuclear cataract, right eye: Secondary | ICD-10-CM | POA: Diagnosis not present

## 2019-11-18 ENCOUNTER — Encounter (HOSPITAL_BASED_OUTPATIENT_CLINIC_OR_DEPARTMENT_OTHER): Payer: Self-pay | Admitting: *Deleted

## 2019-11-18 ENCOUNTER — Emergency Department (HOSPITAL_BASED_OUTPATIENT_CLINIC_OR_DEPARTMENT_OTHER): Payer: Medicare PPO

## 2019-11-18 ENCOUNTER — Other Ambulatory Visit: Payer: Self-pay

## 2019-11-18 ENCOUNTER — Emergency Department (HOSPITAL_BASED_OUTPATIENT_CLINIC_OR_DEPARTMENT_OTHER)
Admission: EM | Admit: 2019-11-18 | Discharge: 2019-11-18 | Disposition: A | Discharge status: Home / Self Care | Payer: Medicare PPO | Attending: Emergency Medicine | Admitting: Emergency Medicine

## 2019-11-18 DIAGNOSIS — I5032 Chronic diastolic (congestive) heart failure: Secondary | ICD-10-CM | POA: Diagnosis not present

## 2019-11-18 DIAGNOSIS — Z7902 Long term (current) use of antithrombotics/antiplatelets: Secondary | ICD-10-CM | POA: Diagnosis not present

## 2019-11-18 DIAGNOSIS — R519 Headache, unspecified: Secondary | ICD-10-CM | POA: Diagnosis not present

## 2019-11-18 DIAGNOSIS — R112 Nausea with vomiting, unspecified: Secondary | ICD-10-CM | POA: Diagnosis not present

## 2019-11-18 DIAGNOSIS — I110 Hypertensive heart disease with heart failure: Secondary | ICD-10-CM | POA: Insufficient documentation

## 2019-11-18 DIAGNOSIS — R0789 Other chest pain: Secondary | ICD-10-CM | POA: Diagnosis not present

## 2019-11-18 DIAGNOSIS — Z7984 Long term (current) use of oral hypoglycemic drugs: Secondary | ICD-10-CM | POA: Diagnosis not present

## 2019-11-18 DIAGNOSIS — J45909 Unspecified asthma, uncomplicated: Secondary | ICD-10-CM | POA: Insufficient documentation

## 2019-11-18 DIAGNOSIS — E860 Dehydration: Secondary | ICD-10-CM | POA: Diagnosis not present

## 2019-11-18 DIAGNOSIS — Z79899 Other long term (current) drug therapy: Secondary | ICD-10-CM | POA: Diagnosis not present

## 2019-11-18 DIAGNOSIS — E119 Type 2 diabetes mellitus without complications: Secondary | ICD-10-CM | POA: Insufficient documentation

## 2019-11-18 DIAGNOSIS — R079 Chest pain, unspecified: Secondary | ICD-10-CM | POA: Diagnosis not present

## 2019-11-18 LAB — URINALYSIS, ROUTINE W REFLEX MICROSCOPIC
Bilirubin Urine: NEGATIVE
Glucose, UA: NEGATIVE mg/dL
Ketones, ur: NEGATIVE mg/dL
Leukocytes,Ua: NEGATIVE
Nitrite: NEGATIVE
Protein, ur: NEGATIVE mg/dL
Specific Gravity, Urine: 1.01 (ref 1.005–1.030)
pH: 7 (ref 5.0–8.0)

## 2019-11-18 LAB — CBC WITH DIFFERENTIAL/PLATELET
Abs Immature Granulocytes: 0.03 10*3/uL (ref 0.00–0.07)
Basophils Absolute: 0 10*3/uL (ref 0.0–0.1)
Basophils Relative: 0 %
Eosinophils Absolute: 0 10*3/uL (ref 0.0–0.5)
Eosinophils Relative: 0 %
HCT: 41.2 % (ref 36.0–46.0)
Hemoglobin: 14 g/dL (ref 12.0–15.0)
Immature Granulocytes: 0 %
Lymphocytes Relative: 15 %
Lymphs Abs: 1.4 10*3/uL (ref 0.7–4.0)
MCH: 29.9 pg (ref 26.0–34.0)
MCHC: 34 g/dL (ref 30.0–36.0)
MCV: 87.8 fL (ref 80.0–100.0)
Monocytes Absolute: 0.4 10*3/uL (ref 0.1–1.0)
Monocytes Relative: 4 %
Neutro Abs: 7.9 10*3/uL — ABNORMAL HIGH (ref 1.7–7.7)
Neutrophils Relative %: 81 %
Platelets: 281 10*3/uL (ref 150–400)
RBC: 4.69 MIL/uL (ref 3.87–5.11)
RDW: 12.6 % (ref 11.5–15.5)
WBC: 9.8 10*3/uL (ref 4.0–10.5)
nRBC: 0 % (ref 0.0–0.2)

## 2019-11-18 LAB — COMPREHENSIVE METABOLIC PANEL
ALT: 22 U/L (ref 0–44)
AST: 34 U/L (ref 15–41)
Albumin: 4.6 g/dL (ref 3.5–5.0)
Alkaline Phosphatase: 151 U/L — ABNORMAL HIGH (ref 38–126)
Anion gap: 12 (ref 5–15)
BUN: 18 mg/dL (ref 8–23)
CO2: 24 mmol/L (ref 22–32)
Calcium: 9 mg/dL (ref 8.9–10.3)
Chloride: 94 mmol/L — ABNORMAL LOW (ref 98–111)
Creatinine, Ser: 0.46 mg/dL (ref 0.44–1.00)
GFR calc Af Amer: >60 mL/min (ref >60)
GFR calc non Af Amer: >60 mL/min (ref >60)
Glucose, Bld: 149 mg/dL — ABNORMAL HIGH (ref 70–99)
Potassium: 3.7 mmol/L (ref 3.5–5.1)
Sodium: 130 mmol/L — ABNORMAL LOW (ref 135–145)
Total Bilirubin: 1 mg/dL (ref 0.3–1.2)
Total Protein: 7.2 g/dL (ref 6.5–8.1)

## 2019-11-18 LAB — CBG MONITORING, ED: Glucose-Capillary: 136 mg/dL — ABNORMAL HIGH (ref 70–99)

## 2019-11-18 LAB — URINALYSIS, MICROSCOPIC (REFLEX): WBC, UA: NONE SEEN WBC/hpf (ref 0–5)

## 2019-11-18 LAB — LIPASE, BLOOD: Lipase: 21 U/L (ref 11–51)

## 2019-11-18 LAB — OCCULT BLOOD X 1 CARD TO LAB, STOOL: Fecal Occult Bld: NEGATIVE

## 2019-11-18 MED ORDER — FAMOTIDINE IN NACL 20-0.9 MG/50ML-% IV SOLN
20.0000 mg | Freq: Once | INTRAVENOUS | Status: AC
  Administered 2019-11-18: 20 mg via INTRAVENOUS
  Filled 2019-11-18: qty 50

## 2019-11-18 MED ORDER — ONDANSETRON 4 MG PO TBDP: 4.0000 mg | ORAL_TABLET | Freq: Three times a day (TID) | ORAL | 0 refills | Status: DC | PRN

## 2019-11-18 MED ORDER — LIDOCAINE VISCOUS HCL 2 % MT SOLN
15.0000 mL | Freq: Once | OROMUCOSAL | Status: AC
  Administered 2019-11-18: 15 mL via ORAL
  Filled 2019-11-18: qty 15

## 2019-11-18 MED ORDER — SODIUM CHLORIDE 0.9 % IV SOLN
INTRAVENOUS | Status: DC | PRN
  Administered 2019-11-18: 250 mL via INTRAVENOUS

## 2019-11-18 MED ORDER — ALUM & MAG HYDROXIDE-SIMETH 200-200-20 MG/5ML PO SUSP
30.0000 mL | Freq: Once | ORAL | Status: AC
  Administered 2019-11-18: 30 mL via ORAL
  Filled 2019-11-18: qty 30

## 2019-11-18 MED ORDER — MORPHINE SULFATE (PF) 4 MG/ML IV SOLN
4.0000 mg | Freq: Once | INTRAVENOUS | Status: AC
  Administered 2019-11-18: 4 mg via INTRAVENOUS
  Filled 2019-11-18: qty 1

## 2019-11-18 MED ORDER — ONDANSETRON HCL 4 MG/2ML IJ SOLN
4.0000 mg | Freq: Once | INTRAMUSCULAR | Status: AC
  Administered 2019-11-18: 4 mg via INTRAVENOUS
  Filled 2019-11-18: qty 2

## 2019-11-18 MED ORDER — SODIUM CHLORIDE 0.9 % IV BOLUS
1000.0000 mL | Freq: Once | INTRAVENOUS | Status: AC
  Administered 2019-11-18: 1000 mL via INTRAVENOUS

## 2019-11-18 NOTE — ED Triage Notes (Signed)
She woke with a headache this am. Cateract surgery yesterday. Vomiting this am. Chest pain with radiation into her back. She had valve replacement in 2018.

## 2019-11-18 NOTE — ED Provider Notes (Signed)
[facility]ER[facility] DEPARTMENT Provider Note   CSN: [id number] Arrival date & time: 11/18/19  1449     History Chief Complaint  Patient presents with  . Chest Pain  . Headache    [name] is a 69 y.o. female.  Pt presents to the ED today with a headache, n/v, and chest pain.  The pt had cataract surgery yesterday with Dr. Gershon Crane.  She saw him that afternoon and eye was doing well.  The pt said she woke up with a generalized headache.  The pt then started vomiting.  She then developed cp with pain radiating to her back.  She said her eye feels fine.        Past Medical History:  Diagnosis Date  . Allergy   . Anxiety   . Arthritis    back & knee  . Asthma     mild per pt shows up with resp illness  . Chronic diastolic congestive heart failure ([place])   . Colon polyps   . Constipation   . Diabetes mellitus without complication ([place])   . Dyspnea   . Gastric polyps   . Gastroparesis   . GERD (gastroesophageal reflux disease)   . Headache    sinus headaches and migraines at times  . Heart murmur   . History of migraine headaches   . HTN (hypertension)   . Hyperlipidemia   . IBS (irritable bowel syndrome)   . Joint pain   . Lumbar disc disease   . S/P aortic valve replacement with bioprosthetic valve 08/23/2016   25 mm Edwards Intuity rapid-deployment bovine pericardial tissue valve via partial upper mini sternotomy  . Sleep apnea   . TIA (transient ischemic attack)     Patient Active Problem List   Diagnosis Date Noted  . Idiopathic hyperphosphatasia 08/03/2019  . Vitamin D deficiency 06/04/2018  . Hyponatremia 02/17/2018  . History of CVA in adulthood 08/13/2017  . Blurry vision 08/13/2017  . Hypertension associated with diabetes ([place]) 01/02/2017  . S/P minimally invasive aortic valve replacement with bioprosthetic valve 08/23/2016  . Diastolic dysfunction   . Osteopenia 05/27/2016  . OSA (obstructive sleep apnea) 02/27/2016  . Bruxism  10/23/2015  . Elevated alkaline phosphatase level 05/16/2015  . Atypical chest pain 11/10/2014  . Acne 05/12/2014  . Diabetes mellitus type 2, controlled ([place]) 05/12/2014  . GERD (gastroesophageal reflux disease) 03/10/2014  . Generalized anxiety disorder 03/10/2014  . Migraines 03/10/2014  . EUSTACHIAN TUBE DYSFUNCTION, CHRONIC 06/14/2010  . Irritable bowel syndrome 12/15/2008  . Low back pain 08/11/2008  . Hyperlipidemia associated with type 2 diabetes mellitus ([place]) 02/09/2008  . Allergic rhinitis 02/12/2007  . Asthma 02/12/2007    Past Surgical History:  Procedure Laterality Date  . ABDOMINAL HYSTERECTOMY    . AORTIC VALVE REPLACEMENT N/A 08/23/2016   Procedure: AORTIC VALVE REPLACEMENT (AVR) - using partial Upper Sternotomy- 32mm Edwards Intuity Aortic Valve used;  Surgeon: Rexene Alberts, MD;  Location: [facility];  Service: Open Heart Surgery;  Laterality: N/A;  . BLADDER SUSPENSION  2007  . BREAST ENHANCEMENT SURGERY  1980  . CARPAL TUNNEL RELEASE Bilateral   . COLONOSCOPY    . DORSAL COMPARTMENT RELEASE Right 09/15/2014   Procedure: RIGHT WRIST DEQUERVAINS RELEASE ;  Surgeon: Kathryne Hitch, MD;  Location: [facility];  Service: Orthopedics;  Laterality: Right;  . ESOPHAGOGASTRODUODENOSCOPY    . KNEE ARTHROSCOPY  04/15/2012   Procedure: ARTHROSCOPY KNEE;  Surgeon: [name], MD;  Location:  [facility];  Service: Orthopedics;  Laterality: Right;  . LAPAROSCOPIC CHOLECYSTECTOMY    . LASIK    . OOPHORECTOMY    . PALATE TO GINGIVA GRAFT  2017  . RIGHT/LEFT HEART CATH AND CORONARY ANGIOGRAPHY N/A 08/02/2016   Procedure: Right/Left Heart Cath and Coronary Angiography;  Surgeon: Sherren Mocha, MD;  Location: [facility];  Service: Cardiovascular;  Laterality: N/A;  . ROTATOR CUFF REPAIR Left   . TEE WITHOUT CARDIOVERSION N/A 08/23/2016   Procedure: TRANSESOPHAGEAL ECHOCARDIOGRAM (TEE);  Surgeon: Rexene Alberts, MD;  Location: [facility];  Service: Open Heart Surgery;   Laterality: N/A;  . TOTAL KNEE ARTHROPLASTY  04/15/2012   Procedure: TOTAL KNEE ARTHROPLASTY;  Surgeon: [name], MD;  Location: [facility];  Service: Orthopedics;  Laterality: Right;  . TRIGGER FINGER RELEASE Right 04/20/2015   Procedure: RIGHT TRIGGER FINGER RELEASE (TENDON SHEALTH INCISION) ;  Surgeon: [name], MD;  Location: [facility];  Service: Orthopedics;  Laterality: Right;     OB History    Gravida  0   Para  0   Term  0   Preterm  0   AB  0   Living  0     SAB  0   TAB  0   Ectopic  0   Multiple  0   Live Births  0           Family History  Problem Relation Age of Onset  . COPD Mother   . Colon polyps Mother   . Irritable bowel syndrome Mother   . Anxiety disorder Mother   . Heart disease Father   . Alcohol abuse Father   . Colon polyps Maternal Aunt   . Other Neg Hx        hyponatremia    Social History   Tobacco Use  . Smoking status: Never Smoker  . Smokeless tobacco: Never Used  Substance Use Topics  . Alcohol use: No  . Drug use: No    Home Medications Prior to Admission medications   Medication Sig Start Date End Date Taking? Authorizing Provider  Accu-Chek Softclix Lancets lancets Use as instructed 09/06/19   [name], MD  atorvastatin (LIPITOR) 40 MG tablet TAKE 1 TABLET (40 MG TOTAL) BY MOUTH DAILY AT 6 PM. 07/12/19   [name], MD  Biotin 5000 MCG TABS Take 5,000 mcg by mouth daily.     [provider]  calcium-vitamin D (OSCAL WITH D) 500-200 MG-UNIT per tablet Take 2 tablets by mouth daily.     [provider]  Cholecalciferol (VITAMIN D) 125 MCG (5000 UT) CAPS Take 5,000 Units by mouth daily.    [provider]  clopidogrel (PLAVIX) 75 MG tablet Take 1 tablet (75 mg total) by mouth daily. 06/08/19   [name], MD  demeclocycline (DECLOMYCIN) 300 MG tablet Take 1 tablet (300 mg total) by mouth 2 (two) times daily. 08/04/19   Renato Shin, MD   fluticasone [facility]) 50 MCG/ACT nasal spray Place 2 sprays into both nostrils daily.  04/24/14   [provider]  glucose blood test strip Use to test blood sugars daily. Dx: E11.9 09/06/19   [name], MD  ipratropium (ATROVENT) 0.03 % nasal spray 2 SPRAYS IN EACH NOSTRIL AS NEEDED THREE TIMES AS NEEDED 03/29/19   [provider]  ketorolac (ACULAR) 0.5 % ophthalmic solution  10/11/19   [provider]  loratadine (CLARITIN) 10 MG tablet  Take 10 mg by mouth at bedtime.     [provider]  losartan (COZAAR) 25 MG tablet Take 1 tablet (25 mg total) by mouth daily. 10/05/19   Dorothy Spark, MD  Melatonin 5 MG TABS Take by mouth.    [provider]  metFORMIN (GLUCOPHAGE) 500 MG tablet TAKE ONE TABLET BY MOUTH WITH BREAKFAST AND 1/2-1 WITH DINNER 06/16/19   [name], MD  metoCLOPramide (REGLAN) 10 MG tablet TAKE 1/2 TABLET BY MOUTH 4 TIMES A DAY 09/13/19   [name], MD  Multiple Vitamin (MULTIVITAMIN WITH MINERALS) TABS tablet Take 1 tablet by mouth daily.    [provider]  naproxen sodium (ALEVE) 220 MG tablet Take 220 mg by mouth.    [provider]  OMEPRAZOLE MAGNESIUM PO Take 40 mg by mouth daily with breakfast.     [provider]  ondansetron (ZOFRAN ODT) 4 MG disintegrating tablet Take 1 tablet (4 mg total) by mouth every 8 (eight) hours as needed. 11/18/19   Isla Pence, MD  Probiotic Product (ALIGN) 4 MG CAPS Take 4 mg by mouth daily.     [provider]  psyllium (METAMUCIL SMOOTH TEXTURE) 28 % packet Take 1 packet by mouth daily before breakfast.     [provider]  venlafaxine XR (EFFEXOR-XR) 75 MG 24 hr capsule TAKE 1 CAPSULE (75 MG TOTAL) BY MOUTH DAILY WITH BREAKFAST. 09/04/19   [name], MD    Allergies    Augmentin [amoxicillin-pot clavulanate], Erythromycin, and Penicillins  Review of Systems   Review of Systems  Gastrointestinal: Positive for  abdominal pain, nausea and vomiting.  Neurological: Positive for headaches.  All other systems reviewed and are negative.   Physical Exam Updated Vital Signs BP (!) 164/68 (BP Location: Right Arm)   Pulse 64   Temp 99 F (37.2 C) (Oral)   Resp 13   Ht 5\' 6"  (1.676 m)   Wt 92.9 kg   LMP  (LMP Unknown)   SpO2 99%   BMI 33.06 kg/m   Physical Exam Vitals and nursing note reviewed.  Constitutional:      Appearance: She is well-developed.  HENT:     Head: Normocephalic and atraumatic.  Eyes:     Extraocular Movements: Extraocular movements intact.     Pupils: Pupils are equal, round, and reactive to light.  Cardiovascular:     Rate and Rhythm: Normal rate and regular rhythm.     Heart sounds: Normal heart sounds.  Pulmonary:     Effort: Pulmonary effort is normal.     Breath sounds: Normal breath sounds.  Abdominal:     General: Bowel sounds are normal.     Palpations: Abdomen is soft.     Tenderness: There is abdominal tenderness in the epigastric area.    Musculoskeletal:        General: Normal range of motion.     Cervical back: Normal range of motion and neck supple.  Skin:    General: Skin is warm and dry.     Capillary Refill: Capillary refill takes less than 2 seconds.  Neurological:     General: No focal deficit present.     Mental Status: She is alert and oriented to person, place, and time.  Psychiatric:        Mood and Affect: Mood normal.        Behavior: Behavior normal.     ED Results / Procedures / Treatments   Labs (all  labs ordered are listed, but only abnormal results are displayed) Labs Reviewed  CBC WITH DIFFERENTIAL/PLATELET - Abnormal; Notable for the following components:      Result Value   Neutro Abs 7.9 (*)    All other components within normal limits  COMPREHENSIVE METABOLIC PANEL - Abnormal; Notable for the following components:   Sodium 130 (*)    Chloride 94 (*)    Glucose, Bld 149 (*)    Alkaline Phosphatase 151 (*)    All  other components within normal limits  URINALYSIS, ROUTINE W REFLEX MICROSCOPIC - Abnormal; Notable for the following components:   Hgb urine dipstick SMALL (*)    All other components within normal limits  URINALYSIS, MICROSCOPIC (REFLEX) - Abnormal; Notable for the following components:   Bacteria, UA RARE (*)    All other components within normal limits  CBG MONITORING, ED - Abnormal; Notable for the following components:   Glucose-Capillary 136 (*)    All other components within normal limits  LIPASE, BLOOD  OCCULT BLOOD X 1 CARD TO LAB, STOOL  POC OCCULT BLOOD, ED    EKG EKG Interpretation  Date/Time:  Thursday Nov 18 2019 15:12:29 EDT Ventricular Rate:  77 PR Interval:    QRS Duration: 96 QT Interval:  398 QTC Calculation: 451 R Axis:   62 Text Interpretation: Sinus rhythm Probable left atrial enlargement Left ventricular hypertrophy No significant change since last tracing Confirmed by Isla Pence [phone]) on 11/18/2019 3:31:54 PM   Radiology DG Chest 2 View  Result Date: 11/18/2019 CLINICAL DATA:  Headache and vomiting this morning, cataract surgery yesterday, chest pain radiating into back EXAM: CHEST - 2 VIEW COMPARISON:  09/16/2016 FINDINGS: Frontal and lateral views of the chest demonstrate postsurgical changes from median sternotomy and aortic valve replacement. The cardiac silhouette is unremarkable. No airspace disease, effusion, or pneumothorax. IMPRESSION: 1. Stable postsurgical changes, no acute process. Electronically Signed   By: Randa Ngo M.D.   On: 11/18/2019 16:35    Procedures Procedures (including critical care time)  Medications Ordered in ED Medications  0.9 %  sodium chloride infusion ( Intravenous Stopped 11/18/19 1718)  sodium chloride 0.9 % bolus 1,000 mL ( Intravenous Stopped 11/18/19 1718)  ondansetron (ZOFRAN) injection 4 mg (4 mg Intravenous Given 11/18/19 1601)  morphine 4 MG/ML injection 4 mg (4 mg Intravenous Given 11/18/19 1602)   famotidine (PEPCID) IVPB 20 mg premix ( Intravenous Stopped 11/18/19 1712)  alum & mag hydroxide-simeth (MAALOX/MYLANTA) 200-200-20 MG/5ML suspension 30 mL (30 mLs Oral Given 11/18/19 1715)    And  lidocaine (XYLOCAINE) 2 % viscous mouth solution 15 mL (15 mLs Oral Given 11/18/19 1715)    ED Course  I have reviewed the triage vital signs and the nursing notes.  Pertinent labs & imaging results that were available during my care of the patient were reviewed by me and considered in my medical decision making (see chart for details).    MDM Rules/Calculators/A&P                       Pt is feeling much better.  She is tolerating po fluids.  She knows to return if worse.  F/u with pcp. Final Clinical Impression(s) / ED Diagnoses Final diagnoses:  Atypical chest pain  Non-intractable vomiting with nausea, unspecified vomiting type  Dehydration    Rx / DC Orders ED Discharge Orders         Ordered    ondansetron (ZOFRAN ODT) 4 MG disintegrating  tablet  Every 8 hours PRN     11/18/19 1845           Isla Pence, MD 11/18/19 1846

## 2019-11-19 ENCOUNTER — Telehealth: Payer: Self-pay | Admitting: Family Medicine

## 2019-11-19 NOTE — Telephone Encounter (Signed)
Did she give any idea of when she wanted to come in if not able to take one of the open appointments?

## 2019-11-19 NOTE — Telephone Encounter (Signed)
I am okay with a 1 PM same-day slot

## 2019-11-19 NOTE — Telephone Encounter (Signed)
Patient was given options Friday afternoon and Tuesday morning but patient is wanting an appointment about lunch time, could we schedule for same day?

## 2019-11-19 NOTE — Telephone Encounter (Signed)
Please advise 

## 2019-11-19 NOTE — Telephone Encounter (Signed)
Patient is requesting to be worked in with Dr. Yong Channel next week for an ED follow up.  There are two openings for next week but patient is looking for something else.   Patient was in the ED for vomiting and headache.  States she is feeling better.  States ED thought she had a possible allergic reaction.

## 2019-11-25 NOTE — Telephone Encounter (Signed)
LVM for patient to call office back to get scheduled with an appointment.

## 2019-11-28 ENCOUNTER — Encounter: Payer: Self-pay | Admitting: Endocrinology

## 2019-11-29 ENCOUNTER — Other Ambulatory Visit: Payer: Self-pay | Admitting: Endocrinology

## 2019-11-29 MED ORDER — SODIUM CHLORIDE 1 G PO TABS: 2.0000 g | ORAL_TABLET | Freq: Every day | ORAL | 5 refills | Status: DC

## 2019-11-30 DIAGNOSIS — M545 Low back pain: Secondary | ICD-10-CM | POA: Diagnosis not present

## 2019-11-30 DIAGNOSIS — M5416 Radiculopathy, lumbar region: Secondary | ICD-10-CM | POA: Diagnosis not present

## 2019-12-08 DIAGNOSIS — M545 Low back pain: Secondary | ICD-10-CM | POA: Diagnosis not present

## 2019-12-08 DIAGNOSIS — M5416 Radiculopathy, lumbar region: Secondary | ICD-10-CM | POA: Diagnosis not present

## 2019-12-09 ENCOUNTER — Encounter: Payer: Self-pay | Admitting: Family Medicine

## 2019-12-09 ENCOUNTER — Ambulatory Visit: Payer: Medicare Other | Admitting: Family Medicine

## 2019-12-09 ENCOUNTER — Other Ambulatory Visit: Payer: Self-pay

## 2019-12-09 VITALS — BP 112/70 | HR 78 | Temp 97.2°F | Ht 66.0 in | Wt 208.0 lb

## 2019-12-09 DIAGNOSIS — F411 Generalized anxiety disorder: Secondary | ICD-10-CM

## 2019-12-09 DIAGNOSIS — K219 Gastro-esophageal reflux disease without esophagitis: Secondary | ICD-10-CM

## 2019-12-09 DIAGNOSIS — I10 Essential (primary) hypertension: Secondary | ICD-10-CM | POA: Diagnosis not present

## 2019-12-09 DIAGNOSIS — I152 Hypertension secondary to endocrine disorders: Secondary | ICD-10-CM

## 2019-12-09 DIAGNOSIS — E1169 Type 2 diabetes mellitus with other specified complication: Secondary | ICD-10-CM | POA: Diagnosis not present

## 2019-12-09 DIAGNOSIS — E119 Type 2 diabetes mellitus without complications: Secondary | ICD-10-CM | POA: Diagnosis not present

## 2019-12-09 DIAGNOSIS — J4520 Mild intermittent asthma, uncomplicated: Secondary | ICD-10-CM | POA: Diagnosis not present

## 2019-12-09 DIAGNOSIS — E785 Hyperlipidemia, unspecified: Secondary | ICD-10-CM | POA: Diagnosis not present

## 2019-12-09 DIAGNOSIS — E1159 Type 2 diabetes mellitus with other circulatory complications: Secondary | ICD-10-CM

## 2019-12-09 DIAGNOSIS — E559 Vitamin D deficiency, unspecified: Secondary | ICD-10-CM | POA: Diagnosis not present

## 2019-12-09 LAB — POCT GLYCOSYLATED HEMOGLOBIN (HGB A1C): Hemoglobin A1C: 5.9 % — AB (ref 4.0–5.6)

## 2019-12-09 NOTE — Progress Notes (Signed)
Phone [phone] In person visit   Subjective:   [name] is a 69 y.o. year old very pleasant female patient who presents for/with See problem oriented charting Chief Complaint  Patient presents with  . Follow-up  . Diabetes  . Hypertension    This visit occurred during the SARS-CoV-2 public health emergency.  Safety protocols were in place, including screening questions prior to the visit, additional usage of staff PPE, and extensive cleaning of exam room while observing appropriate contact time as indicated for disinfecting solutions.   Past Medical History-  Patient Active Problem List   Diagnosis Date Noted  . History of CVA in adulthood 08/13/2017    Priority: High  . S/P minimally invasive aortic valve replacement with bioprosthetic valve 08/23/2016    Priority: High  . Diastolic dysfunction     Priority: High  . Diabetes mellitus type 2, controlled ([place]) 05/12/2014    Priority: High  . Vitamin D deficiency 06/04/2018    Priority: Medium  . Hyponatremia 02/17/2018    Priority: Medium  . Hypertension associated with diabetes ([place]) 01/02/2017    Priority: Medium  . Osteopenia 05/27/2016    Priority: Medium  . OSA (obstructive sleep apnea) 02/27/2016    Priority: Medium  . Elevated alkaline phosphatase level 05/16/2015    Priority: Medium  . GERD (gastroesophageal reflux disease) 03/10/2014    Priority: Medium  . Generalized anxiety disorder 03/10/2014    Priority: Medium  . Migraines 03/10/2014    Priority: Medium  . Irritable bowel syndrome 12/15/2008    Priority: Medium  . Hyperlipidemia associated with type 2 diabetes mellitus ([place]) 02/09/2008    Priority: Medium  . Blurry vision 08/13/2017    Priority: Low  . Bruxism 10/23/2015    Priority: Low  . Atypical chest pain 11/10/2014    Priority: Low  . Acne 05/12/2014    Priority: Low  . EUSTACHIAN TUBE DYSFUNCTION, CHRONIC 06/14/2010    Priority: Low  . Low back pain 08/11/2008     Priority: Low  . Allergic rhinitis 02/12/2007    Priority: Low  . Asthma 02/12/2007    Priority: Low  . Idiopathic hyperphosphatasia 08/03/2019    Medications- reviewed and updated Current Outpatient Medications  Medication Sig Dispense Refill  . Accu-Chek Softclix Lancets lancets Use as instructed 100 each 12  . atorvastatin (LIPITOR) 40 MG tablet TAKE 1 TABLET (40 MG TOTAL) BY MOUTH DAILY AT 6 PM. 90 tablet 3  . Biotin 5000 MCG TABS Take 5,000 mcg by mouth daily.     . calcium-vitamin D (OSCAL WITH D) 500-200 MG-UNIT per tablet Take 2 tablets by mouth daily.     . Cholecalciferol (VITAMIN D) 125 MCG (5000 UT) CAPS Take 5,000 Units by mouth daily.    . clopidogrel (PLAVIX) 75 MG tablet Take 1 tablet (75 mg total) by mouth daily. 90 tablet 3  . demeclocycline (DECLOMYCIN) 300 MG tablet Take 1 tablet (300 mg total) by mouth 2 (two) times daily. 180 tablet 3  . fluticasone (FLONASE) 50 MCG/ACT nasal spray Place 2 sprays into both nostrils daily.     [name] glucose blood test strip Use to test blood sugars daily. Dx: E11.9 100 each 12  . ipratropium (ATROVENT) 0.03 % nasal spray 2 SPRAYS IN EACH NOSTRIL AS NEEDED THREE TIMES AS NEEDED    . ketorolac (ACULAR) 0.5 % ophthalmic solution     . loratadine (CLARITIN) 10 MG tablet Take 10 mg by mouth at bedtime.     [name]  losartan (COZAAR) 25 MG tablet Take 1 tablet (25 mg total) by mouth daily. 90 tablet 2  . Melatonin 5 MG TABS Take by mouth.    . metFORMIN (GLUCOPHAGE) 500 MG tablet TAKE ONE TABLET BY MOUTH WITH BREAKFAST AND 1/2-1 WITH DINNER 180 tablet 1  . metoCLOPramide (REGLAN) 10 MG tablet TAKE 1/2 TABLET BY MOUTH 4 TIMES A DAY 60 tablet 3  . Multiple Vitamin (MULTIVITAMIN WITH MINERALS) TABS tablet Take 1 tablet by mouth daily.    . naproxen sodium (ALEVE) 220 MG tablet Take 220 mg by mouth.    . OMEPRAZOLE MAGNESIUM PO Take 40 mg by mouth daily with breakfast.     . ondansetron (ZOFRAN ODT) 4 MG disintegrating tablet Take 1 tablet (4 mg total)  by mouth every 8 (eight) hours as needed. 10 tablet 0  . Probiotic Product (ALIGN) 4 MG CAPS Take 4 mg by mouth daily.     . psyllium (METAMUCIL SMOOTH TEXTURE) 28 % packet Take 1 packet by mouth daily before breakfast.     . sodium chloride 1 g tablet Take 2 tablets (2 g total) by mouth daily. 60 tablet 5  . venlafaxine XR (EFFEXOR-XR) 75 MG 24 hr capsule TAKE 1 CAPSULE (75 MG TOTAL) BY MOUTH DAILY WITH BREAKFAST. 90 capsule 2   No current facility-administered medications for this visit.     Objective:  BP 112/70   Pulse 78   Temp (!) 97.2 F (36.2 C)   Ht 5\' 6"  (1.676 m)   Wt 208 lb (94.3 kg)   LMP  (LMP Unknown)   SpO2 100%   BMI 33.57 kg/m  Gen: NAD, resting comfortably CV: RRR no murmurs rubs or gallops Lungs: CTAB no crackles, wheeze, rhonchi Abdomen: soft/nontender/nondistended/normal bowel sounds. Ext: no edema Skin: warm, dry    Assessment and Plan   #History of stroke-remains on Plavix long-term   %s/p aortic valve replacement with bioprosthetic valve- requires dental prophylaxis. Follows with Dr. Meda Coffee. Catheterization 08/02/16 with minor nonobstructive CAD.   % OSA- remains on CPAP with Dr. Halford Chessman. Compliant  %obesity- working hard with weigh to wellness program. Has been 2 months since being in- agrees to follow up  # Diabetes S: compliant with metformin 500mg  BID- now down to 500 AM, 250 PM.  CBGs- this AM higher after injection at 207 Exercise and diet-some setbacks with recent back issues and cataract surgery-hoping to improve and is planning to get back to the healthy weight loss program Lab Results  Component Value Date   HGBA1C. POC 5.9 (A) 12/09/2019   HGBA1C 6.1 06/08/2019   HGBA1C 5.7 (A) 05/24/2019   A/P: stable-continue current medications.  Recheck at next visit in 6 months  #hypertension/diastolic dysfunction (prior listed as CHF but no chronic lasix need and no hospitalization for HF) S: compliant with losartan 25 mg. Prior on metoprolol  12.5mg  BID and lasix BP Readings from Last 3 Encounters:  12/09/19 112/70  11/18/19 (!) 129/56  10/05/19 132/78  A/P: Stable. Continue current medications.   #hyperlipidemia/history of stroke S: for lipids compliant with  atorvastatin 40mg .   Patient also compliant with stroke history- plavix   A/P: Excellent control with LDL under 70-continue current medications  # generalized anxiety  S: patient is compliant with venlafaxine 75mg .  We are using lower dose as had more hyponatremia in the past- possible SIADH.  A/P: doing well- now on salt tablets through Dr. Loanne Drilling   # Migraines - follows with Dr. Tomi Likens S:patient with history  of stroke- sumatriptan was stopped due to stroke history. Venlafaxine mentioned for migraine prophylaxis. PRN treatment with ibuprofen or execrin with reglan per Dr. Tomi Likens- thought lower risk than sumatriptan .  -Thankfully no recent migraines A/P: Throat she is doing well-continue current medications  # Asthma/allergic rhinitis- Dr. Harold Hedge S: doing well  recently with prn albuterol only. Compliant with claritin, astelin, nasonex, saline. 2020 stopped allergy shots A/P: doing well- continue current meds/follow up once yearly   # Low back pain/degenerative disc disease S: intermittent issues- has been better with weight loss. Should avoid nsaids with plavix . Had to hve 2 injections recently including one recently- was also having sciatica. Raliegh Ip Dr. Ron Agee  -toe pain is better after injection with ortho as well plus expensive orthotics A/P: hopefully will be improving- will monitor- glad she has ortho follow up    # GERD S:Dr. [name] has advised higher dose omeprazole at 40mg  per patient so she wants to continue this dose- compliant   A/P: doing well- continue current meds    #also had cataract surgery and with 2nd one ended up in ER- improved while in ER - unclear why reacted to same time to same medications. Fentanyl, versed, zofran.    #Alkaline phosphatase-remains slightly elevated.  Dr. Loanne Drilling has been okay with current levels and wonders if it is related to her arthritis per patient   Recommended follow up: 6 months physical Future Appointments  Date Time Provider [place]  12/28/2019  2:30 PM Renato Shin, MD [facility] None  01/07/2020  3:00 PM Parrett, Fonnie Mu, NP [facility] None  07/20/2020  2:30 PM Jaffe, Adam R, DO [facility] None    Lab/Order associations:   ICD-10-CM   1. Hypertension associated with diabetes ([place])  E11.59    I10   2. Mild intermittent asthma without complication  X91.47   3. Gastroesophageal reflux disease without esophagitis  K21.9   4. Hyperlipidemia associated with type 2 diabetes mellitus ([place])  E11.69    E78.5   5. Controlled type 2 diabetes mellitus without complication, without long-term current use of insulin ([facility])  E11.9 POCT HgB A1C    CANCELED: Hemoglobin A1c  6. Generalized anxiety disorder  F41.1   7. Vitamin D deficiency  E55.9     No orders of the defined types were placed in this encounter.    Return precautions advised.  Garret Reddish, MD

## 2019-12-09 NOTE — Patient Instructions (Addendum)
Hoping you are at the end of your tough stretch! I think getting back into the healthy weight wellness program would be great

## 2019-12-10 ENCOUNTER — Other Ambulatory Visit: Payer: Self-pay

## 2019-12-10 DIAGNOSIS — E119 Type 2 diabetes mellitus without complications: Secondary | ICD-10-CM

## 2019-12-10 MED ORDER — BLOOD GLUCOSE MONITOR KIT: PACK | 0 refills | Status: AC

## 2019-12-14 DIAGNOSIS — G4733 Obstructive sleep apnea (adult) (pediatric): Secondary | ICD-10-CM | POA: Diagnosis not present

## 2019-12-21 ENCOUNTER — Encounter: Payer: Self-pay | Admitting: Family Medicine

## 2019-12-23 ENCOUNTER — Ambulatory Visit (INDEPENDENT_AMBULATORY_CARE_PROVIDER_SITE_OTHER): Payer: Medicare PPO | Admitting: Physician Assistant

## 2019-12-23 ENCOUNTER — Encounter: Payer: Self-pay | Admitting: Family Medicine

## 2019-12-23 ENCOUNTER — Other Ambulatory Visit: Payer: Self-pay

## 2019-12-23 ENCOUNTER — Encounter (INDEPENDENT_AMBULATORY_CARE_PROVIDER_SITE_OTHER): Payer: Self-pay | Admitting: Physician Assistant

## 2019-12-23 VITALS — BP 140/70 | HR 64 | Temp 98.8°F | Ht 66.0 in | Wt 205.0 lb

## 2019-12-23 DIAGNOSIS — E7849 Other hyperlipidemia: Secondary | ICD-10-CM

## 2019-12-23 DIAGNOSIS — Z6833 Body mass index (BMI) 33.0-33.9, adult: Secondary | ICD-10-CM

## 2019-12-23 DIAGNOSIS — E119 Type 2 diabetes mellitus without complications: Secondary | ICD-10-CM

## 2019-12-23 DIAGNOSIS — E669 Obesity, unspecified: Secondary | ICD-10-CM

## 2019-12-23 NOTE — Telephone Encounter (Signed)
Please advise in this matter, I was told that [facility] scheduled this patient on a day that I was not there. It was suppose to be on [place] schedule before she left.  I was under the impression the matter was resolved by Juliann Pulse. However patient is saying different. I will call the patient today just tell me if that's going to be ok?

## 2019-12-23 NOTE — Telephone Encounter (Signed)
It looks like the patient's husband, [name], was schedule for AWV on 12/21/19.  Ebony Hail called both numbers on 12/13/19 to try and reschedule, but there was no answer and no voicemail for her to leave a message.  AWV can still be completed.

## 2019-12-28 ENCOUNTER — Encounter: Payer: Self-pay | Admitting: Endocrinology

## 2019-12-28 ENCOUNTER — Other Ambulatory Visit: Payer: Self-pay

## 2019-12-28 ENCOUNTER — Encounter: Payer: Self-pay | Admitting: Internal Medicine

## 2019-12-28 ENCOUNTER — Ambulatory Visit: Payer: Medicare PPO | Admitting: Endocrinology

## 2019-12-28 VITALS — BP 122/72 | HR 59 | Ht 66.0 in | Wt 210.2 lb

## 2019-12-28 DIAGNOSIS — R748 Abnormal levels of other serum enzymes: Secondary | ICD-10-CM

## 2019-12-28 DIAGNOSIS — E871 Hypo-osmolality and hyponatremia: Secondary | ICD-10-CM

## 2019-12-28 LAB — BASIC METABOLIC PANEL
BUN: 18 mg/dL (ref 6–23)
CO2: 28 mEq/L (ref 19–32)
Calcium: 9.4 mg/dL (ref 8.4–10.5)
Chloride: 97 mEq/L (ref 96–112)
Creatinine, Ser: 0.62 mg/dL (ref 0.40–1.20)
GFR: 95.42 mL/min (ref >60.00)
Glucose, Bld: 103 mg/dL — ABNORMAL HIGH (ref 70–99)
Potassium: 4.2 mEq/L (ref 3.5–5.1)
Sodium: 131 mEq/L — ABNORMAL LOW (ref 135–145)

## 2019-12-28 LAB — BRAIN NATRIURETIC PEPTIDE: Pro B Natriuretic peptide (BNP): 54 pg/mL (ref 0.0–100.0)

## 2019-12-28 MED ORDER — SODIUM CHLORIDE 1 G PO TABS
4.0000 g | ORAL_TABLET | Freq: Every day | ORAL | 5 refills | Status: DC
Start: 2019-12-28 — End: 2020-05-23

## 2019-12-28 NOTE — Progress Notes (Signed)
Subjective:    Patient ID: [name], female    DOB: [birth year], 69 y.o.   MRN: [id number]  HPI Pt returns for f/u of hyponatremia (dx'ed 2013; only cause found was metaclopramide, which she cannot safely stop; she takes demeclocycline; ACTH stim test, creat, and TFT were normal; MRI showed old CVA's; NaCl was added 6/21).  Pt says IBS-C is requiring her to drink more fluids.   Pt also has h/o elev AP (etiol is uncertain; isoenzymes suggested bone cause; bone scan showed multifocal degenerative changes. No evidence of acute or destructive process; plan is to follow).   Past Medical History:  Diagnosis Date  . Allergy   . Anxiety   . Arthritis    back & knee  . Asthma     mild per pt shows up with resp illness  . Chronic diastolic congestive heart failure ([place])   . Colon polyps   . Constipation   . Diabetes mellitus without complication ([place])   . Dyspnea   . Gastric polyps   . Gastroparesis   . GERD (gastroesophageal reflux disease)   . Headache    sinus headaches and migraines at times  . Heart murmur   . History of migraine headaches   . HTN (hypertension)   . Hyperlipidemia   . IBS (irritable bowel syndrome)   . Joint pain   . Lumbar disc disease   . S/P aortic valve replacement with bioprosthetic valve 08/23/2016   25 mm Edwards Intuity rapid-deployment bovine pericardial tissue valve via partial upper mini sternotomy  . Sleep apnea   . TIA (transient ischemic attack)     Past Surgical History:  Procedure Laterality Date  . ABDOMINAL HYSTERECTOMY    . AORTIC VALVE REPLACEMENT N/A 08/23/2016   Procedure: AORTIC VALVE REPLACEMENT (AVR) - using partial Upper Sternotomy- 48m Edwards Intuity Aortic Valve used;  Surgeon: CRexene Alberts MD;  [facility]  Service: Open Heart Surgery;  Laterality: N/A;  . BLADDER SUSPENSION  2007  . BREAST ENHANCEMENT SURGERY  1980  . CARPAL TUNNEL RELEASE Bilateral   . COLONOSCOPY    . DORSAL COMPARTMENT RELEASE Right  09/15/2014   Procedure: RIGHT WRIST DEQUERVAINS RELEASE ;  Surgeon: DKathryne Hitch MD;  [facility]  Service: Orthopedics;  Laterality: Right;  . ESOPHAGOGASTRODUODENOSCOPY    . KNEE ARTHROSCOPY  04/15/2012   Procedure: ARTHROSCOPY KNEE;  Surgeon: DNinetta Lights MD;  [facility]  Service: Orthopedics;  Laterality: Right;  . LAPAROSCOPIC CHOLECYSTECTOMY    . LASIK    . OOPHORECTOMY    . PALATE TO GINGIVA GRAFT  2017  . RIGHT/LEFT HEART CATH AND CORONARY ANGIOGRAPHY N/A 08/02/2016   Procedure: Right/Left Heart Cath and Coronary Angiography;  Surgeon: MSherren Mocha MD;  [facility];  Service: Cardiovascular;  Laterality: N/A;  . ROTATOR CUFF REPAIR Left   . TEE WITHOUT CARDIOVERSION N/A 08/23/2016   Procedure: TRANSESOPHAGEAL ECHOCARDIOGRAM (TEE);  Surgeon: CRexene Alberts MD;  [facility]  Service: Open Heart Surgery;  Laterality: N/A;  . TOTAL KNEE ARTHROPLASTY  04/15/2012   Procedure: TOTAL KNEE ARTHROPLASTY;  Surgeon: DNinetta Lights MD;  [facility]  Service: Orthopedics;  Laterality: Right;  . TRIGGER FINGER RELEASE Right 04/20/2015   Procedure: RIGHT TRIGGER FINGER RELEASE (TENDON SHEALTH INCISION) ;  Surgeon: DNinetta Lights MD;  [facility]  Service: Orthopedics;  Laterality: Right;    Social History   Socioeconomic History  .  Marital status: Married    Spouse name: [name]  . Number of children: 1  . Years of education: Not on file  . Highest education level: Not on file  Occupational History  . Occupation: Retired, Curator: RETIRED  Tobacco Use  . Smoking status: Never Smoker  . Smokeless tobacco: Never Used  Vaping Use  . Vaping Use: Never used  Substance and Sexual Activity  . Alcohol use: No  . Drug use: No  . Sexual activity: Yes  Other Topics Concern  . Not on file  Social History Narrative   She lives with husband (1978) and two dogs. Step-son [name]  (1971-psychiatrist in [place]). No grandkids. 2 dogs-sheltie/collie mix and border collie mix      Highest level of education:  Master in education   She is retired Animal nutritionist x 32 years.      Hobbies: time with dogs   Right handed   One story home         Social Determinants of Health   Financial Resource Strain:   . Difficulty of Paying Living Expenses:   Food Insecurity:   . Worried About Charity fundraiser in the Last Year:   . Arboriculturist in the Last Year:   Transportation Needs:   . Film/video editor (Medical):   [name] Lack of Transportation (Non-Medical):   Physical Activity:   . Days of Exercise per Week:   . Minutes of Exercise per Session:   Stress:   . Feeling of Stress :   Social Connections:   . Frequency of Communication with Friends and Family:   . Frequency of Social Gatherings with Friends and Family:   . Attends Religious Services:   . Active Member of Clubs or Organizations:   . Attends Archivist Meetings:   [name] Marital Status:   Intimate Partner Violence:   . Fear of Current or Ex-Partner:   . Emotionally Abused:   [name] Physically Abused:   . Sexually Abused:     Current Outpatient Medications on File Prior to Visit  Medication Sig Dispense Refill  . atorvastatin (LIPITOR) 40 MG tablet TAKE 1 TABLET (40 MG TOTAL) BY MOUTH DAILY AT 6 PM. 90 tablet 3  . Biotin 5000 MCG TABS Take 5,000 mcg by mouth daily.     . blood glucose meter kit and supplies KIT Dispense based on patient and insurance preference. Use up to four times daily as directed. Dx: E11.9 1 each 0  . calcium-vitamin D (OSCAL WITH D) 500-200 MG-UNIT per tablet Take 2 tablets by mouth daily.     . Cholecalciferol (VITAMIN D) 125 MCG (5000 UT) CAPS Take 5,000 Units by mouth daily.    . clopidogrel (PLAVIX) 75 MG tablet Take 1 tablet (75 mg total) by mouth daily. 90 tablet 3  . demeclocycline (DECLOMYCIN) 300 MG tablet Take 1 tablet (300 mg total) by mouth 2 (two) times  daily. 180 tablet 3  . fluticasone (FLONASE) 50 MCG/ACT nasal spray Place 2 sprays into both nostrils daily.     [name] glucose blood test strip Use to test blood sugars daily. Dx: E11.9 100 each 12  . ipratropium (ATROVENT) 0.03 % nasal spray 2 SPRAYS IN EACH NOSTRIL AS NEEDED THREE TIMES AS NEEDED    . loratadine (CLARITIN) 10 MG tablet Take 10 mg by mouth at bedtime.     [name] losartan (COZAAR) 25 MG tablet Take 1 tablet (25 mg  total) by mouth daily. 90 tablet 2  . Melatonin 5 MG TABS Take by mouth.    . metFORMIN (GLUCOPHAGE) 500 MG tablet TAKE ONE TABLET BY MOUTH WITH BREAKFAST AND 1/2-1 WITH DINNER 180 tablet 1  . metoCLOPramide (REGLAN) 10 MG tablet TAKE 1/2 TABLET BY MOUTH 4 TIMES A DAY 60 tablet 3  . Multiple Vitamin (MULTIVITAMIN WITH MINERALS) TABS tablet Take 1 tablet by mouth daily.    . naproxen sodium (ALEVE) 220 MG tablet Take 220 mg by mouth.    . OMEPRAZOLE MAGNESIUM PO Take 40 mg by mouth daily with breakfast.     . ondansetron (ZOFRAN ODT) 4 MG disintegrating tablet Take 1 tablet (4 mg total) by mouth every 8 (eight) hours as needed. 10 tablet 0  . Probiotic Product (ALIGN) 4 MG CAPS Take 4 mg by mouth daily.     . psyllium (METAMUCIL SMOOTH TEXTURE) 28 % packet Take 1 packet by mouth daily before breakfast.     . venlafaxine XR (EFFEXOR-XR) 75 MG 24 hr capsule TAKE 1 CAPSULE (75 MG TOTAL) BY MOUTH DAILY WITH BREAKFAST. 90 capsule 2   No current facility-administered medications on file prior to visit.    Allergies  Allergen Reactions  . Augmentin [Amoxicillin-Pot Clavulanate] Rash    rash  . Erythromycin Nausea Only  . Penicillins Itching and Rash    Has patient had a PCN reaction causing immediate rash, facial/tongue/throat swelling, SOB or lightheadedness with hypotension: No Has patient had a PCN reaction causing severe rash involving mucus membranes or skin necrosis: No Has patient had a PCN reaction that required hospitalization: No Has patient had a PCN reaction  occurring within the last 10 years: No  If all of the above answers are "NO", then may proceed with Cephalosporin use.     Family History  Problem Relation Age of Onset  . COPD Mother   . Colon polyps Mother   . Irritable bowel syndrome Mother   . Anxiety disorder Mother   . Heart disease Father   . Alcohol abuse Father   . Colon polyps Maternal Aunt   . Other Neg Hx        hyponatremia    BP 122/72   Pulse (!) 59   Ht _0  (1.676 m)   Wt 210 lb 3.2 oz (95.3 kg)   LMP  (LMP Unknown)   SpO2 98%   BMI 33.93 kg/m    Review of Systems She has intermitt headache.       Objective:   Physical Exam VITAL SIGNS:  See vs page.   GENERAL: no distress.   CV: no leg edema.   Lab Results  Component Value Date   ALT 22 11/18/2019   AST 34 11/18/2019   ALKPHOS 151 (H) 11/18/2019   BILITOT 1.0 11/18/2019   Lab Results  Component Value Date   CREATININE 0.62 12/28/2019   BUN 18 12/28/2019   NA 131 (L) 12/28/2019   K 4.2 12/28/2019   CL 97 12/28/2019   CO2 28 12/28/2019   BNP=54.     Assessment & Plan:  elev AP: we'll follow.   Hyponatremia, persistent.  Increase NaCl.  Please come back for a follow-up appointment in 3 months

## 2019-12-28 NOTE — Patient Instructions (Addendum)
Blood tests are requested for you today.  We'll let you know about the results.  If the sodium is low again, we'll add salt tablets.   Please come back for a follow-up appointment in 3 months.

## 2019-12-28 NOTE — Progress Notes (Signed)
Chief Complaint:   OBESITY [name] is here to discuss her progress with her obesity treatment plan along with follow-up of her obesity related diagnoses. Marvel is on the Category 3 Plan and states she is following her eating plan approximately 85-90% of the time. [name] states she is exercising 0 minutes 0 times per week.  Today's visit was #: 66 Starting weight: 220 lbs Starting date: 01/19/2018 Today's weight: 205 lbs Today's date: 12/23/2019 Total lbs lost to date: 15 Total lbs lost since last in-office visit: 0  Interim History: [name] reports that she has had numerous health issues and has not been able to focus fully on her plan. She has been back on plan the last 6 days and is following it closely.  Subjective:   Type 2 diabetes mellitus without complication, without long-term current use of insulin ([name]). Fasting blood sugars range between 102 and 150. Last A1c 5.9 on 12/09/2019. [name] is on metformin and denies nausea, vomiting, or diarrhea.   Lab Results  Component Value Date   HGBA1C 5.9 (A) 12/09/2019   HGBA1C 6.1 06/08/2019   HGBA1C 5.7 (A) 05/24/2019   Lab Results  Component Value Date   MICROALBUR <0.7 01/05/2019   LDLCALC 64 06/08/2019   CREATININE 0.46 11/18/2019   Lab Results  Component Value Date   INSULIN 12.1 01/19/2018   Other hyperlipidemia. [name] is on losartan. No chest pain or myalgias.    Lab Results  Component Value Date   CHOL 131 06/08/2019   HDL 53.90 06/08/2019   LDLCALC 64 06/08/2019   LDLDIRECT 58.0 11/25/2017   TRIG 65.0 06/08/2019   CHOLHDL 2 06/08/2019   Lab Results  Component Value Date   ALT 22 11/18/2019   AST 34 11/18/2019   ALKPHOS 151 (H) 11/18/2019   BILITOT 1.0 11/18/2019   The ASCVD Risk score Mikey Bussing DC Jr., et al., 2013) failed to calculate for the following reasons:   The patient has a prior MI or stroke diagnosis  Assessment/Plan:   Type 2 diabetes mellitus without  complication, without long-term current use of insulin ([name]). Good blood sugar control is important to decrease the likelihood of diabetic complications such as nephropathy, neuropathy, limb loss, blindness, coronary artery disease, and death. Intensive lifestyle modification including diet, exercise and weight loss are the first line of treatment for diabetes. [name] will continue her medication as directed.   Other hyperlipidemia. Cardiovascular risk and specific lipid/LDL goals reviewed.  We discussed several lifestyle modifications today and [name] will continue to work on diet, exercise and weight loss efforts. Orders and follow up as documented in patient record. She will continue her medication as directed.   Counseling Intensive lifestyle modifications are the first line treatment for this issue. . Dietary changes: Increase soluble fiber. Decrease simple carbohydrates. . Exercise changes: Moderate to vigorous-intensity aerobic activity 150 minutes per week if tolerated. . Lipid-lowering medications: see documented in medical record.  Class 1 obesity with serious comorbidity and body mass index (BMI) of 33.0 to 33.9 in adult, unspecified obesity type.  [name] is currently in the action stage of change. As such, her goal is to continue with weight loss efforts. She has agreed to the Category 3 Plan.   Exercise goals: Older adults should follow the adult guidelines. When older adults cannot meet the adult guidelines, they should be as physically active as their abilities and conditions will allow.   Behavioral modification strategies: meal planning and cooking strategies and planning for success.  [name] has agreed to follow-up with our clinic in 2 weeks. She was informed of the importance of frequent follow-up visits to maximize her success with intensive lifestyle modifications for her multiple health conditions.   Objective:   Blood pressure 140/70, pulse 64, temperature  98.8 F (37.1 C), temperature source Oral, height 5\' 6"  (1.676 m), weight 205 lb (93 kg), SpO2 99 %. Body mass index is 33.09 kg/m.  General: Cooperative, alert, well developed, in no acute distress. HEENT: Conjunctivae and lids unremarkable. Cardiovascular: Regular rhythm.  Lungs: Normal work of breathing. Neurologic: No focal deficits.   Lab Results  Component Value Date   CREATININE 0.46 11/18/2019   BUN 18 11/18/2019   NA 130 (L) 11/18/2019   K 3.7 11/18/2019   CL 94 (L) 11/18/2019   CO2 24 11/18/2019   Lab Results  Component Value Date   ALT 22 11/18/2019   AST 34 11/18/2019   ALKPHOS 151 (H) 11/18/2019   BILITOT 1.0 11/18/2019   Lab Results  Component Value Date   HGBA1C 5.9 (A) 12/09/2019   HGBA1C 6.1 06/08/2019   HGBA1C 5.7 (A) 05/24/2019   HGBA1C 5.7 (A) 01/05/2019   HGBA1C 6.1 06/04/2018   Lab Results  Component Value Date   INSULIN 12.1 01/19/2018   Lab Results  Component Value Date   TSH 3.38 06/04/2018   Lab Results  Component Value Date   CHOL 131 06/08/2019   HDL 53.90 06/08/2019   LDLCALC 64 06/08/2019   LDLDIRECT 58.0 11/25/2017   TRIG 65.0 06/08/2019   CHOLHDL 2 06/08/2019   Lab Results  Component Value Date   WBC 9.8 11/18/2019   HGB 14.0 11/18/2019   HCT 41.2 11/18/2019   MCV 87.8 11/18/2019   PLT 281 11/18/2019   No results found for: IRON, TIBC, FERRITIN  Obesity Behavioral Intervention Documentation for Insurance:   Approximately 15 minutes were spent on the discussion below.  ASK: We discussed the diagnosis of obesity with [name] today and [name] agreed to give [name] permission to discuss obesity behavioral modification therapy today.  ASSESS: [name] has the diagnosis of obesity and her BMI today is 33.1. [name] is in the action stage of change.   ADVISE: [name] was educated on the multiple health risks of obesity as well as the benefit of weight loss to improve her health. She was advised of the need  for long term treatment and the importance of lifestyle modifications to improve her current health and to decrease her risk of future health problems.  AGREE: Multiple dietary modification options and treatment options were discussed and [name] agreed to follow the recommendations documented in the above note.  ARRANGE: [name] was educated on the importance of frequent visits to treat obesity as outlined per CMS and USPSTF guidelines and agreed to schedule her next follow up appointment today.  Attestation Statements:   Reviewed by clinician on day of visit: allergies, medications, problem list, medical history, surgical history, family history, social history, and previous encounter notes.  IMichaelene Song, am acting as transcriptionist for [name], PA-C   I have reviewed the above documentation for accuracy and completeness, and I agree with the above. [name], PA-C

## 2019-12-31 ENCOUNTER — Ambulatory Visit: Payer: Self-pay | Admitting: Adult Health

## 2019-12-31 ENCOUNTER — Ambulatory Visit: Payer: Medicare Other | Admitting: Adult Health

## 2020-01-03 DIAGNOSIS — M7062 Trochanteric bursitis, left hip: Secondary | ICD-10-CM | POA: Diagnosis not present

## 2020-01-03 DIAGNOSIS — M545 Low back pain: Secondary | ICD-10-CM | POA: Diagnosis not present

## 2020-01-07 ENCOUNTER — Ambulatory Visit: Payer: Medicare PPO | Admitting: Adult Health

## 2020-01-07 ENCOUNTER — Encounter: Payer: Self-pay | Admitting: Adult Health

## 2020-01-07 ENCOUNTER — Other Ambulatory Visit: Payer: Self-pay

## 2020-01-07 DIAGNOSIS — E669 Obesity, unspecified: Secondary | ICD-10-CM

## 2020-01-07 DIAGNOSIS — G4733 Obstructive sleep apnea (adult) (pediatric): Secondary | ICD-10-CM | POA: Diagnosis not present

## 2020-01-07 NOTE — Assessment & Plan Note (Signed)
Work on healthy weight 

## 2020-01-07 NOTE — Patient Instructions (Signed)
Continue on CPAP At bedtime.  Keep up good work.  Do not drive if sleepy .  Work on healthy weight  Follow up with Dr. Halford Chessman  Or Cherith Tewell in 1 year and As needed

## 2020-01-07 NOTE — Progress Notes (Signed)
_0  ID: [name], female    DOB: 09-09-1950, 69 y.o.   MRN: [id number]  Chief Complaint  Patient presents with  . Follow-up    OSA     Referring provider: [name], MD  HPI: 69 yo female followed for OSA   TEST/EVENTS :  Home sleep study July 2017AHI 19/hr  01/07/2020 Follow up : OSA  Patient returns for a 1 year follow-up.  Says she is doing well on CPAP.  She wears it every night never misses any nights.  She feels rested with no significant daytime sleepiness.  CPAP download shows excellent compliance with 100% usage.  Daily average usage at 7.5 hours.  Patient is on auto CPAP 5 to 10 cm H2O.  Daily average pressure at 10 cm H2O.  AHI is 1.2.  Minimal leaks. Does complain of nasal dryness.  Discussed saline and saline nasal gel.  Allergies  Allergen Reactions  . Augmentin [Amoxicillin-Pot Clavulanate] Rash    rash  . Erythromycin Nausea Only  . Penicillins Itching and Rash    Has patient had a PCN reaction causing immediate rash, facial/tongue/throat swelling, SOB or lightheadedness with hypotension: No Has patient had a PCN reaction causing severe rash involving mucus membranes or skin necrosis: No Has patient had a PCN reaction that required hospitalization: No Has patient had a PCN reaction occurring within the last 10 years: No  If all of the above answers are "NO", then may proceed with Cephalosporin use.     Immunization History  Administered Date(s) Administered  . Influenza Split 04/04/2011, 03/05/2012  . Influenza Whole 04/16/2007, 03/16/2009, 03/13/2010  . Influenza, High Dose Seasonal PF 01/31/2019  . Influenza,inj,Quad PF,6+ Mos 03/30/2013, 03/10/2014, 03/23/2015, 02/27/2016  . Influenza-Unspecified 02/27/2017, 02/28/2017, 01/31/2018  . PFIZER SARS-COV-2 Vaccination 08/15/2019, 09/08/2019  . Pneumococcal Conjugate-13 05/21/2016  . Pneumococcal Polysaccharide-23 05/23/2017  . Td 06/24/1996, 08/06/2007, 11/07/2017  . Tdap  11/07/2017  . Zoster 05/12/2014  . Zoster Recombinat (Shingrix) 01/21/2019, 03/24/2019    Past Medical History:  Diagnosis Date  . Allergy   . Anxiety   . Arthritis    back & knee  . Asthma     mild per pt shows up with resp illness  . Chronic diastolic congestive heart failure ([place])   . Colon polyps   . Constipation   . Diabetes mellitus without complication ([place])   . Dyspnea   . Gastric polyps   . Gastroparesis   . GERD (gastroesophageal reflux disease)   . Headache    sinus headaches and migraines at times  . Heart murmur   . History of migraine headaches   . HTN (hypertension)   . Hyperlipidemia   . IBS (irritable bowel syndrome)   . Joint pain   . Lumbar disc disease   . S/P aortic valve replacement with bioprosthetic valve 08/23/2016   25 mm [employer] Intuity rapid-deployment bovine pericardial tissue valve via partial upper mini sternotomy  . Sleep apnea   . TIA (transient ischemic attack)     Tobacco History: Social History   Tobacco Use  Smoking Status Never Smoker  Smokeless Tobacco Never Used   Counseling given: Not Answered   Outpatient Medications Prior to Visit  Medication Sig Dispense Refill  . atorvastatin (LIPITOR) 40 MG tablet TAKE 1 TABLET (40 MG TOTAL) BY MOUTH DAILY AT 6 PM. 90 tablet 3  . Biotin 5000 MCG TABS Take 5,000 mcg by mouth daily.     . blood glucose meter kit and  supplies KIT Dispense based on patient and insurance preference. Use up to four times daily as directed. Dx: E11.9 1 each 0  . calcium-vitamin D (OSCAL WITH D) 500-200 MG-UNIT per tablet Take 2 tablets by mouth daily.     . Cholecalciferol (VITAMIN D) 125 MCG (5000 UT) CAPS Take 5,000 Units by mouth daily.    . clopidogrel (PLAVIX) 75 MG tablet Take 1 tablet (75 mg total) by mouth daily. 90 tablet 3  . demeclocycline (DECLOMYCIN) 300 MG tablet Take 1 tablet (300 mg total) by mouth 2 (two) times daily. 180 tablet 3  . fluticasone (FLONASE) 50 MCG/ACT nasal spray Place 2 sprays  into both nostrils daily.     [name] glucose blood test strip Use to test blood sugars daily. Dx: E11.9 100 each 12  . ipratropium (ATROVENT) 0.03 % nasal spray 2 SPRAYS IN EACH NOSTRIL AS NEEDED THREE TIMES AS NEEDED    . loratadine (CLARITIN) 10 MG tablet Take 10 mg by mouth at bedtime.     [name] losartan (COZAAR) 25 MG tablet Take 1 tablet (25 mg total) by mouth daily. 90 tablet 2  . Melatonin 5 MG TABS Take by mouth.    . metFORMIN (GLUCOPHAGE) 500 MG tablet TAKE ONE TABLET BY MOUTH WITH BREAKFAST AND 1/2-1 WITH DINNER 180 tablet 1  . metoCLOPramide (REGLAN) 10 MG tablet TAKE 1/2 TABLET BY MOUTH 4 TIMES A DAY 60 tablet 3  . Multiple Vitamin (MULTIVITAMIN WITH MINERALS) TABS tablet Take 1 tablet by mouth daily.    . naproxen sodium (ALEVE) 220 MG tablet Take 220 mg by mouth.    . OMEPRAZOLE MAGNESIUM PO Take 40 mg by mouth daily with breakfast.     . ondansetron (ZOFRAN ODT) 4 MG disintegrating tablet Take 1 tablet (4 mg total) by mouth every 8 (eight) hours as needed. 10 tablet 0  . Probiotic Product (ALIGN) 4 MG CAPS Take 4 mg by mouth daily.     . psyllium (METAMUCIL SMOOTH TEXTURE) 28 % packet Take 1 packet by mouth daily before breakfast.     . sodium chloride 1 g tablet Take 4 tablets (4 g total) by mouth daily. 120 tablet 5  . venlafaxine XR (EFFEXOR-XR) 75 MG 24 hr capsule TAKE 1 CAPSULE (75 MG TOTAL) BY MOUTH DAILY WITH BREAKFAST. 90 capsule 2   No facility-administered medications prior to visit.     Review of Systems:   Constitutional:   No  weight loss, night sweats,  Fevers, chills, fatigue, or  lassitude.  HEENT:   No headaches,  Difficulty swallowing,  Tooth/dental problems, or  Sore throat,                No sneezing, itching, ear ache, nasal congestion, post nasal drip,   CV:  No chest pain,  Orthopnea, PND, swelling in lower extremities, anasarca, dizziness, palpitations, syncope.   GI  No heartburn, indigestion, abdominal pain, nausea, vomiting, diarrhea, change in bowel  habits, loss of appetite, bloody stools.   Resp: No shortness of breath with exertion or at rest.  No excess mucus, no productive cough,  No non-productive cough,  No coughing up of blood.  No change in color of mucus.  No wheezing.  No chest wall deformity  Skin: no rash or lesions.  GU: no dysuria, change in color of urine, no urgency or frequency.  No flank pain, no hematuria   MS:  No joint pain or swelling.  No decreased range of motion.  No back  pain.    Physical Exam  BP 126/84 (BP Location: Left Arm, Cuff Size: Normal)   Pulse 62   Ht _0  (1.676 m)   Wt 211 lb (95.7 kg)   LMP  (LMP Unknown)   SpO2 100%   BMI 34.06 kg/m   GEN: A/Ox3; pleasant , NAD, well nourished    HEENT:  [place]/AT,    NOSE-clear, THROAT-clear, no lesions, no postnasal drip or exudate noted.  Class II MP airway  NECK:  Supple w/ fair ROM; no JVD; normal carotid impulses w/o bruits; no thyromegaly or nodules palpated; no lymphadenopathy.    RESP  Clear  P & A; w/o, wheezes/ rales/ or rhonchi. no accessory muscle use, no dullness to percussion  CARD:  RRR, no m/r/g, no peripheral edema, pulses intact, no cyanosis or clubbing.  GI:   Soft & nt; nml bowel sounds; no organomegaly or masses detected.   Musco: Warm bil, no deformities or joint swelling noted.   Neuro: alert, no focal deficits noted.    Skin: Warm, no lesions or rashes    Lab Results:  CBC  BMET   BNP No results found for: BNP  ProBNP Imaging: No results found.    PFT Results Latest Ref Rng & Units 08/21/2016  FVC-Pre L 3.07  FVC-Predicted Pre % 94  FVC-Post L 3.00  FVC-Predicted Post % 91  Pre FEV1/FVC % % 84  Post FEV1/FCV % % 85  FEV1-Pre L 2.59  FEV1-Predicted Pre % 104  FEV1-Post L 2.55  DLCO UNC% % 72  DLCO COR %Predicted % 93  TLC L 5.36  TLC % Predicted % 103  RV % Predicted % 115    No results found for: NITRICOXIDE      Assessment & Plan:   OSA (obstructive sleep apnea) Excellent control  and compliance on CPAP  Plan  Patient Instructions  Continue on CPAP At bedtime.  Keep up good work.  Do not drive if sleepy .  Work on healthy weight  Follow up with Dr. Halford Chessman  Or Germaine Ripp in 1 year and As needed        Obesity (BMI 30.0-34.9) Work on healthy [employer], NP 01/07/2020

## 2020-01-07 NOTE — Assessment & Plan Note (Signed)
Excellent control and compliance on CPAP  Plan  Patient Instructions  Continue on CPAP At bedtime.  Keep up good work.  Do not drive if sleepy .  Work on healthy weight  Follow up with Dr. Halford Chessman  Or Stephana Morell in 1 year and As needed

## 2020-01-08 NOTE — Progress Notes (Signed)
Reviewed and agree with assessment/plan.   Chesley Mires, MD [facility] Pulmonary/Critical Care 01/08/2020, 8:05 AM Pager:  [phone]

## 2020-01-12 DIAGNOSIS — M7062 Trochanteric bursitis, left hip: Secondary | ICD-10-CM | POA: Diagnosis not present

## 2020-01-13 ENCOUNTER — Other Ambulatory Visit: Payer: Self-pay

## 2020-01-13 ENCOUNTER — Ambulatory Visit (INDEPENDENT_AMBULATORY_CARE_PROVIDER_SITE_OTHER): Payer: Medicare PPO | Admitting: Family Medicine

## 2020-01-13 ENCOUNTER — Encounter (INDEPENDENT_AMBULATORY_CARE_PROVIDER_SITE_OTHER): Payer: Self-pay | Admitting: Family Medicine

## 2020-01-13 VITALS — BP 171/64 | HR 63 | Temp 97.9°F | Ht 66.0 in | Wt 203.0 lb

## 2020-01-13 DIAGNOSIS — Z6832 Body mass index (BMI) 32.0-32.9, adult: Secondary | ICD-10-CM | POA: Diagnosis not present

## 2020-01-13 DIAGNOSIS — E1169 Type 2 diabetes mellitus with other specified complication: Secondary | ICD-10-CM | POA: Diagnosis not present

## 2020-01-13 DIAGNOSIS — G4733 Obstructive sleep apnea (adult) (pediatric): Secondary | ICD-10-CM | POA: Diagnosis not present

## 2020-01-13 DIAGNOSIS — E1159 Type 2 diabetes mellitus with other circulatory complications: Secondary | ICD-10-CM | POA: Diagnosis not present

## 2020-01-13 DIAGNOSIS — E669 Obesity, unspecified: Secondary | ICD-10-CM

## 2020-01-13 DIAGNOSIS — Z9989 Dependence on other enabling machines and devices: Secondary | ICD-10-CM

## 2020-01-13 DIAGNOSIS — I10 Essential (primary) hypertension: Secondary | ICD-10-CM

## 2020-01-13 DIAGNOSIS — I152 Hypertension secondary to endocrine disorders: Secondary | ICD-10-CM

## 2020-01-13 NOTE — Progress Notes (Signed)
Chief Complaint:   OBESITY [name] is here to discuss her progress with her obesity treatment plan along with follow-up of her obesity related diagnoses. [name] is on the Category 3 Plan and states she is following her eating plan approximately 90% of the time. [name] states she is exercising for 0 minutes 0 times per week.  Today's visit was #: 49 Starting weight: 220 lbs Starting date: 01/19/2018 Today's weight: 203 lbs Today's date: 01/17/2020 Total lbs lost to date: 17 lbs Total lbs lost since last in-office visit: 2 lbs  Interim History: [name] says she has had several injections for her back lately and feels the steroids inhibit her weight loss.  They eat out everyday (chicken skewers and salad).  She says the diet is going well for her.  She denies concerns.  Her hunger is well-controlled.  Cravings are also well-controlled.  She is using her snack calories with [place] bars and 100 calorie bags of popcorn.  Subjective:   1. Type 2 diabetes mellitus with other specified complication, without long-term current use of insulin (HCC) Medications reviewed. Diabetic ROS: no polyuria or polydipsia, no chest pain, dyspnea or TIA's, no numbness, tingling or pain in extremities.  Fasting blood sugars have ranged from the 120s-140s at home over the last 2-3 weeks.  No side effects or complaints today.  She is taking metformin per her PCP, Dr. Yong Channel.  Tolerating well.  Lab Results  Component Value Date   HGBA1C 5.9 (A) 12/09/2019   HGBA1C 6.1 06/08/2019   HGBA1C 5.7 (A) 05/24/2019   Lab Results  Component Value Date   MICROALBUR <0.7 01/05/2019   LDLCALC 64 06/08/2019   CREATININE 0.62 12/28/2019   Lab Results  Component Value Date   INSULIN 12.1 01/19/2018   2. Hypertension associated with diabetes ([place]) Review: taking medications as instructed, no medication side effects noted, no chest pain on exertion, no dyspnea on exertion, no swelling of ankles.  Treated by  Dr. Meda Coffee of Cardiology.  She says she does not check her blood pressure at home.  No symptoms or concerns.  She is on losartan and says she is tolerating it well, without concerns.   BP Readings from Last 3 Encounters:  01/13/20 (!) 171/64  01/07/20 126/84  12/28/19 122/72   3. OSA on CPAP [name] has a diagnosis of sleep apnea. She reports that she is using a CPAP regularly.  She saw her sleep medicine doctor 2-3 weeks ago.  Her blood pressure was normal in their office, she says.  Denies concerns with her mask.   Assessment/Plan:   1. Type 2 diabetes mellitus with other specified complication, without long-term current use of insulin (HCC) Good blood sugar control is important to decrease the likelihood of diabetic complications such as nephropathy, neuropathy, limb loss, blindness, coronary artery disease, and death. Intensive lifestyle modification including diet, exercise and weight loss are the first line of treatment for diabetes.  Continue medications per PCP.  Continue prudent nutritional plan, weight loss.  Continue home blood pressure monitoring.  Check A1c every 3 months or per PCP.  2. Hypertension associated with diabetes ([place]) Mattalyn is working on healthy weight loss and exercise to improve blood pressure control. We will watch for signs of hypotension as she continues her lifestyle modifications.  Strongly encouraged to get home blood pressure monitor for upper arm and check 2 days per week and keep a log.  Continue prudent nutritional plan, weight loss.  3. OSA on CPAP Intensive  lifestyle modifications are the first line treatment for this issue. We discussed several lifestyle modifications today and she will continue to work on diet, exercise and weight loss efforts. We will continue to monitor. Orders and follow up as documented in patient record.  Continue treatment per Sleep Medicine.  Importance of controlling sleep apnea as pertaining to weight loss was discussed  with [name] today.  Counseling  Sleep apnea is a condition in which breathing pauses or becomes shallow during sleep. This happens over and over during the night. This disrupts your sleep and keeps your body from getting the rest that it needs, which can cause tiredness and lack of energy (fatigue) during the day.  Sleep apnea treatment: If you were given a device to open your airway while you sleep, USE IT!  Sleep hygiene:   Limit or avoid alcohol, caffeinated beverages, and cigarettes, especially close to bedtime.   Do not eat a large meal or eat spicy foods right before bedtime. This can lead to digestive discomfort that can make it hard for you to sleep.  Keep a sleep diary to help you and your health care provider figure out what could be causing your insomnia.  . Make your bedroom a dark, comfortable place where it is easy to fall asleep. ? Put up shades or blackout curtains to block light from outside. ? Use a white noise machine to block noise. ? Keep the temperature cool. . Limit screen use before bedtime. This includes: ? Watching TV. ? Using your smartphone, tablet, or computer. . Stick to a routine that includes going to bed and waking up at the same times every day and night. This can help you fall asleep faster. Consider making a quiet activity, such as reading, part of your nighttime routine. . Try to avoid taking naps during the day so that you sleep better at night. . Get out of bed if you are still awake after 15 minutes of trying to sleep. Keep the lights down, but try reading or doing a quiet activity. When you feel sleepy, go back to bed.  4. Class 1 obesity with serious comorbidity and body mass index (BMI) of 32.0 to 32.9 in adult, unspecified obesity type [name] is currently in the action stage of change. As such, her goal is to continue with weight loss efforts. She has agreed to the Category 3 Plan.   Exercise goals: As is.  Behavioral modification  strategies: increasing lean protein intake, decreasing eating out, better snacking choices, travel eating strategies, celebration eating strategies and planning for success.  [name] has agreed to follow-up with our clinic in 2 weeks. She was informed of the importance of frequent follow-up visits to maximize her success with intensive lifestyle modifications for her multiple health conditions.   Objective:   Blood pressure (!) 171/64, pulse 63, temperature 97.9 F (36.6 C), height 5\' 6"  (1.676 m), weight (!) 203 lb (92.1 kg), SpO2 98 %. Body mass index is 32.77 kg/m.  General: Cooperative, alert, well developed, in no acute distress. HEENT: Conjunctivae and lids unremarkable. Cardiovascular: Regular rhythm.  Lungs: Normal work of breathing. Neurologic: No focal deficits.   Lab Results  Component Value Date   CREATININE 0.62 12/28/2019   BUN 18 12/28/2019   NA 131 (L) 12/28/2019   K 4.2 12/28/2019   CL 97 12/28/2019   CO2 28 12/28/2019   Lab Results  Component Value Date   ALT 22 11/18/2019   AST 34 11/18/2019   ALKPHOS 151 (  H) 11/18/2019   BILITOT 1.0 11/18/2019   Lab Results  Component Value Date   HGBA1C 5.9 (A) 12/09/2019   HGBA1C 6.1 06/08/2019   HGBA1C 5.7 (A) 05/24/2019   HGBA1C 5.7 (A) 01/05/2019   HGBA1C 6.1 06/04/2018   Lab Results  Component Value Date   INSULIN 12.1 01/19/2018   Lab Results  Component Value Date   TSH 3.38 06/04/2018   Lab Results  Component Value Date   CHOL 131 06/08/2019   HDL 53.90 06/08/2019   LDLCALC 64 06/08/2019   LDLDIRECT 58.0 11/25/2017   TRIG 65.0 06/08/2019   CHOLHDL 2 06/08/2019   Lab Results  Component Value Date   WBC 9.8 11/18/2019   HGB 14.0 11/18/2019   HCT 41.2 11/18/2019   MCV 87.8 11/18/2019   PLT 281 11/18/2019   Obesity Behavioral Intervention Documentation for Insurance:   Approximately 15 minutes were spent on the discussion below.  ASK: We discussed the diagnosis of obesity with  [name] today and Vesna agreed to give [name] permission to discuss obesity behavioral modification therapy today.  ASSESS: Samirah has the diagnosis of obesity and her BMI today is 32.9. Artesha is in the action stage of change.   ADVISE: Gwen was educated on the multiple health risks of obesity as well as the benefit of weight loss to improve her health. She was advised of the need for long term treatment and the importance of lifestyle modifications to improve her current health and to decrease her risk of future health problems.  AGREE: Multiple dietary modification options and treatment options were discussed and Helane agreed to follow the recommendations documented in the above note.  ARRANGE: Anella was educated on the importance of frequent visits to treat obesity as outlined per CMS and USPSTF guidelines and agreed to schedule her next follow up appointment today.  Attestation Statements:   Reviewed by clinician on day of visit: allergies, medications, problem list, medical history, surgical history, family history, social history, and previous encounter notes.  I, Water quality scientist, CMA, am acting as Location manager for [employer], DO.  I have reviewed the above documentation for accuracy and completeness, and I agree with the above. Mellody Dance, DO

## 2020-01-17 ENCOUNTER — Encounter: Payer: Self-pay | Admitting: Family Medicine

## 2020-02-03 ENCOUNTER — Encounter (INDEPENDENT_AMBULATORY_CARE_PROVIDER_SITE_OTHER): Payer: Self-pay | Admitting: Physician Assistant

## 2020-02-03 ENCOUNTER — Other Ambulatory Visit: Payer: Self-pay

## 2020-02-03 ENCOUNTER — Ambulatory Visit (INDEPENDENT_AMBULATORY_CARE_PROVIDER_SITE_OTHER): Payer: Medicare PPO | Admitting: Physician Assistant

## 2020-02-03 VITALS — BP 124/70 | HR 73 | Temp 98.7°F | Ht 66.0 in | Wt 203.0 lb

## 2020-02-03 DIAGNOSIS — E669 Obesity, unspecified: Secondary | ICD-10-CM | POA: Diagnosis not present

## 2020-02-03 DIAGNOSIS — E1169 Type 2 diabetes mellitus with other specified complication: Secondary | ICD-10-CM | POA: Diagnosis not present

## 2020-02-03 DIAGNOSIS — E785 Hyperlipidemia, unspecified: Secondary | ICD-10-CM | POA: Diagnosis not present

## 2020-02-03 DIAGNOSIS — Z6832 Body mass index (BMI) 32.0-32.9, adult: Secondary | ICD-10-CM | POA: Diagnosis not present

## 2020-02-03 DIAGNOSIS — I10 Essential (primary) hypertension: Secondary | ICD-10-CM | POA: Diagnosis not present

## 2020-02-07 NOTE — Progress Notes (Signed)
Chief Complaint:   OBESITY [name] is here to discuss her progress with her obesity treatment plan along with follow-up of her obesity related diagnoses. [name] is on the Category 3 Plan and states she is following her eating plan approximately 90% of the time. [name] states she is walking 15-20 minutes 4 times per week.  Today's visit was #: 70 Starting weight: 220 lbs Starting date: 01/19/2018 Today's weight: 203 lbs Today's date: 02/03/2020 Total lbs lost to date: 17 Total lbs lost since last in-office visit: 0  Interim History: [name] reports that she has been following the plan pretty well. She has had to make protein substitutions when unable to eat all of the protein on the plan.  Subjective:   Essential hypertension. [name] is on Cozaar. No chest pain, headache, or dizziness. She has seen her PCP and cardiologist since her last visit and blood pressures were normal at that time. Recheck of blood pressure here was 124/70.  BP Readings from Last 3 Encounters:  02/03/20 124/70  01/13/20 (!) 171/64  01/07/20 126/84   Lab Results  Component Value Date   CREATININE 0.62 12/28/2019   CREATININE 0.46 11/18/2019   CREATININE 0.65 08/03/2019   Type 2 diabetes mellitus with hyperlipidemia ([place]). Fasting blood sugars are in the range of 120 and 139.  Lab Results  Component Value Date   HGBA1C 5.9 (A) 12/09/2019   HGBA1C 6.1 06/08/2019   HGBA1C 5.7 (A) 05/24/2019   Lab Results  Component Value Date   MICROALBUR <0.7 01/05/2019   LDLCALC 64 06/08/2019   CREATININE 0.62 12/28/2019   Lab Results  Component Value Date   INSULIN 12.1 01/19/2018   Assessment/Plan:   Essential hypertension. [name] is working on healthy weight loss and exercise to improve blood pressure control. We will watch for signs of hypotension as she continues her lifestyle modifications. She will continue her medication as directed and will follow-up with her PCP as  scheduled.  Type 2 diabetes mellitus with hyperlipidemia ([name]). Good blood sugar control is important to decrease the likelihood of diabetic complications such as nephropathy, neuropathy, limb loss, blindness, coronary artery disease, and death. Intensive lifestyle modification including diet, exercise and weight loss are the first line of treatment for diabetes. [name] will continue metformin as directed.   Class 1 obesity with serious comorbidity and body mass index (BMI) of 32.0 to 32.9 in adult, unspecified obesity type.  [name] is currently in the action stage of change. As such, her goal is to continue with weight loss efforts. She has agreed to keeping a food journal and adhering to recommended goals of 1400-1500 calories and 95 grams of protein daily.   Exercise goals: Older adults should follow the adult guidelines. When older adults cannot meet the adult guidelines, they should be as physically active as their abilities and conditions will allow.   Behavioral modification strategies: meal planning and cooking strategies and keeping healthy foods in the home.  [name] has agreed to follow-up with our clinic in 2 weeks. She was informed of the importance of frequent follow-up visits to maximize her success with intensive lifestyle modifications for her multiple health conditions.   Objective:   Blood pressure 124/70, pulse 73, temperature 98.7 F (37.1 C), temperature source Oral, height 5\' 6"  (1.676 m), weight 203 lb (92.1 kg), SpO2 98 %. Body mass index is 32.77 kg/m.  General: Cooperative, alert, well developed, in no acute distress. HEENT: Conjunctivae and lids unremarkable. Cardiovascular: Regular rhythm.  Lungs: Normal work of breathing. Neurologic: No focal deficits.   Lab Results  Component Value Date   CREATININE 0.62 12/28/2019   BUN 18 12/28/2019   NA 131 (L) 12/28/2019   K 4.2 12/28/2019   CL 97 12/28/2019   CO2 28 12/28/2019   Lab Results   Component Value Date   ALT 22 11/18/2019   AST 34 11/18/2019   ALKPHOS 151 (H) 11/18/2019   BILITOT 1.0 11/18/2019   Lab Results  Component Value Date   HGBA1C 5.9 (A) 12/09/2019   HGBA1C 6.1 06/08/2019   HGBA1C 5.7 (A) 05/24/2019   HGBA1C 5.7 (A) 01/05/2019   HGBA1C 6.1 06/04/2018   Lab Results  Component Value Date   INSULIN 12.1 01/19/2018   Lab Results  Component Value Date   TSH 3.38 06/04/2018   Lab Results  Component Value Date   CHOL 131 06/08/2019   HDL 53.90 06/08/2019   LDLCALC 64 06/08/2019   LDLDIRECT 58.0 11/25/2017   TRIG 65.0 06/08/2019   CHOLHDL 2 06/08/2019   Lab Results  Component Value Date   WBC 9.8 11/18/2019   HGB 14.0 11/18/2019   HCT 41.2 11/18/2019   MCV 87.8 11/18/2019   PLT 281 11/18/2019   No results found for: IRON, TIBC, FERRITIN  Obesity Behavioral Intervention Documentation for Insurance:   Approximately 15 minutes were spent on the discussion below.  ASK: We discussed the diagnosis of obesity with [name] today and [name] agreed to give [name] permission to discuss obesity behavioral modification therapy today.  ASSESS: [name] has the diagnosis of obesity and her BMI today is 32.8. [name] is in the action stage of change.   ADVISE: [name] was educated on the multiple health risks of obesity as well as the benefit of weight loss to improve her health. She was advised of the need for long term treatment and the importance of lifestyle modifications to improve her current health and to decrease her risk of future health problems.  AGREE: Multiple dietary modification options and treatment options were discussed and [name] agreed to follow the recommendations documented in the above note.  ARRANGE: [name] was educated on the importance of frequent visits to treat obesity as outlined per CMS and USPSTF guidelines and agreed to schedule her next follow up appointment today.  Attestation Statements:    Reviewed by clinician on day of visit: allergies, medications, problem list, medical history, surgical history, family history, social history, and previous encounter notes.  IMichaelene Song, am acting as transcriptionist for [name], PA-C   I have reviewed the above documentation for accuracy and completeness, and I agree with the above. [name], PA-C

## 2020-02-13 ENCOUNTER — Other Ambulatory Visit: Payer: Self-pay | Admitting: Family Medicine

## 2020-02-17 ENCOUNTER — Ambulatory Visit (INDEPENDENT_AMBULATORY_CARE_PROVIDER_SITE_OTHER): Payer: Medicare PPO | Admitting: Physician Assistant

## 2020-02-17 ENCOUNTER — Encounter (INDEPENDENT_AMBULATORY_CARE_PROVIDER_SITE_OTHER): Payer: Self-pay | Admitting: Physician Assistant

## 2020-02-17 ENCOUNTER — Other Ambulatory Visit: Payer: Self-pay

## 2020-02-17 VITALS — BP 151/67 | HR 59 | Temp 99.0°F | Ht 66.0 in | Wt 200.0 lb

## 2020-02-17 DIAGNOSIS — E1169 Type 2 diabetes mellitus with other specified complication: Secondary | ICD-10-CM | POA: Diagnosis not present

## 2020-02-17 DIAGNOSIS — Z6832 Body mass index (BMI) 32.0-32.9, adult: Secondary | ICD-10-CM

## 2020-02-17 DIAGNOSIS — E669 Obesity, unspecified: Secondary | ICD-10-CM | POA: Diagnosis not present

## 2020-02-17 DIAGNOSIS — E559 Vitamin D deficiency, unspecified: Secondary | ICD-10-CM

## 2020-02-17 DIAGNOSIS — E785 Hyperlipidemia, unspecified: Secondary | ICD-10-CM

## 2020-02-17 NOTE — Progress Notes (Signed)
Chief Complaint:   OBESITY Inice P Krammes is here to discuss her progress with her obesity treatment plan along with follow-up of her obesity related diagnoses. 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 Mattison states she is walking 15 minutes 3 times per week.  Today's visit was #: 55 Starting weight: 220 lbs Starting date: 01/19/2018 Today's weight: 200 lbs Today's date: 02/17/2020 Total lbs lost to date: 20 Total lbs lost since last in-office visit: 3  Interim History: Laticia states that journaling really helped keep her on track. She is staying around 1400 calories daily. She is not tracking her protein yet, but eating a protein bar at night if she feels like she needs it.  Subjective:   Type 2 diabetes mellitus with hyperlipidemia ([place]). Margueriter is on metformin and denies polyphagia. No nausea, vomiting, or diarrhea.    Lab Results  Component Value Date   HGBA1C 5.9 (A) 12/09/2019   HGBA1C 6.1 06/08/2019   HGBA1C 5.7 (A) 05/24/2019   Lab Results  Component Value Date   MICROALBUR <0.7 01/05/2019   LDLCALC 64 06/08/2019   CREATININE 0.62 12/28/2019   Lab Results  Component Value Date   INSULIN 12.1 01/19/2018   Vitamin D deficiency. Markiah is on Vitamin D daily. No nausea, vomiting, or muscle weakness.    Ref. Range 01/19/2018 09:59  Vitamin D, 25-Hydroxy Latest Ref Range: 30.0 - 100.0 ng/mL 27.9 (L)   Assessment/Plan:   Type 2 diabetes mellitus with hyperlipidemia ([place]). Good blood sugar control is important to decrease the likelihood of diabetic complications such as nephropathy, neuropathy, limb loss, blindness, coronary artery disease, and death. Intensive lifestyle modification including diet, exercise and weight loss are the first line of treatment for diabetes. Maxcine will continue her  medication as directed.   Vitamin D deficiency. Low Vitamin D level contributes to fatigue and are associated with obesity, breast, and colon cancer. She agrees to continue to take Vitamin D as directed and will follow-up for routine testing of Vitamin D, at least 2-3 times per year to avoid over-replacement.  Class 1 obesity with serious comorbidity and body mass index (BMI) of 32.0 to 32.9 in adult, unspecified obesity type.  Jessah is currently in the action stage of change. As such, her goal is to continue with weight loss efforts. She has agreed to keeping a food journal and adhering to recommended goals of 1400-1500 calories and 95 grams of protein daily.   Exercise goals: Older adults should follow the adult guidelines. When older adults cannot meet the adult guidelines, they should be as physically active as their abilities and conditions will allow.   Behavioral modification strategies: meal planning and cooking strategies and keeping healthy foods in the home.  Toya has agreed to follow-up with our clinic in 2-3 weeks. She was informed of the importance of frequent follow-up visits to maximize her success with intensive lifestyle modifications for her multiple health conditions.   Objective:   Blood pressure (!) 151/67, pulse (!) 59, temperature 99 F (37.2 C), temperature source Oral, height 5\' 6"  (1.676 m), weight 200 lb (90.7 kg), SpO2 99 %. Body mass index is 32.28 kg/m.  General: Cooperative, alert, well developed, in no acute distress. HEENT: Conjunctivae and lids unremarkable. Cardiovascular: Regular rhythm.  Lungs: Normal work of breathing. Neurologic: No focal deficits.   Lab Results  Component Value Date   CREATININE 0.62 12/28/2019   BUN 18 12/28/2019   NA 131 (L) 12/28/2019   K 4.2 12/28/2019   CL 97 12/28/2019   CO2 28 12/28/2019   Lab Results  Component Value Date   ALT 22 11/18/2019   AST 34 11/18/2019   ALKPHOS 151 (H) 11/18/2019   BILITOT  1.0 11/18/2019   Lab Results  Component Value Date   HGBA1C 5.9 (A) 12/09/2019   HGBA1C 6.1 06/08/2019   HGBA1C 5.7 (A) 05/24/2019   HGBA1C 5.7 (A) 01/05/2019   HGBA1C 6.1 06/04/2018   Lab Results  Component Value Date   INSULIN 12.1 01/19/2018   Lab Results  Component Value Date   TSH 3.38 06/04/2018   Lab Results  Component Value Date   CHOL 131 06/08/2019   HDL 53.90 06/08/2019   LDLCALC 64 06/08/2019   LDLDIRECT 58.0 11/25/2017   TRIG 65.0 06/08/2019   CHOLHDL 2 06/08/2019   Lab Results  Component Value Date   WBC 9.8 11/18/2019   HGB 14.0 11/18/2019   HCT 41.2 11/18/2019   MCV 87.8 11/18/2019   PLT 281 11/18/2019   No results found for: IRON, TIBC, FERRITIN  Obesity Behavioral Intervention Documentation for Insurance:   Approximately 15 minutes were spent on the discussion below.  ASK: We discussed the diagnosis of obesity with [name] today and Stevee agreed to give [name] permission to discuss obesity behavioral modification therapy today.  ASSESS: Arleen has the diagnosis of obesity and her BMI today is 32.4. Tayah is in the action stage of change.   ADVISE: Elyana was educated on the multiple health risks of obesity as well as the benefit of weight loss to improve her health. She was advised of the need for long term treatment and the importance of lifestyle modifications to improve her current health and to decrease her risk of future health problems.  AGREE: Multiple dietary modification options and treatment options were discussed and Mylena agreed to follow the recommendations documented in the above note.  ARRANGE: Leathia was educated on the importance of frequent visits to treat obesity as outlined per CMS and USPSTF guidelines and agreed to schedule her next follow up appointment today.  Attestation Statements:   Reviewed by clinician on day of visit: allergies, medications, problem list, medical history, surgical  history, family history, social history, and previous encounter notes.  IMichaelene Song, am acting as transcriptionist for [name], PA-C   I have reviewed the above documentation for accuracy and completeness, and I agree with the above. [name], PA-C

## 2020-02-22 ENCOUNTER — Ambulatory Visit: Payer: Medicare PPO | Admitting: Internal Medicine

## 2020-02-22 ENCOUNTER — Telehealth: Payer: Self-pay

## 2020-02-22 VITALS — BP 112/70 | HR 60 | Ht 64.75 in | Wt 206.2 lb

## 2020-02-22 DIAGNOSIS — R1012 Left upper quadrant pain: Secondary | ICD-10-CM | POA: Diagnosis not present

## 2020-02-22 DIAGNOSIS — R1310 Dysphagia, unspecified: Secondary | ICD-10-CM | POA: Diagnosis not present

## 2020-02-22 DIAGNOSIS — K5900 Constipation, unspecified: Secondary | ICD-10-CM | POA: Diagnosis not present

## 2020-02-22 DIAGNOSIS — K219 Gastro-esophageal reflux disease without esophagitis: Secondary | ICD-10-CM

## 2020-02-22 NOTE — Telephone Encounter (Signed)
  02/22/2020   RE: WHISPER KURKA DOB: 30-Jul-1950 MRN: [id number]   Dear Dr. Yong Channel,    We have scheduled the above patient for an endoscopic procedure. Our records show that she is on anticoagulation therapy.   Please advise as to how long the patient may come off her therapy of Plavix prior to the procedure, which is scheduled for 03/09/2020.  Please fax back/ or route the completed form to [facility] at [phone].   Sincerely,    Phillis Haggis

## 2020-02-22 NOTE — Patient Instructions (Signed)
You will be contacted by our office prior to your procedure for directions on holding your Plavix.  If you do not hear from our office 1 week prior to your scheduled procedure, please call [phone] to discuss.   Take baby aspirin while you are off the Plavix.  Take one dose of Miralax daily

## 2020-02-22 NOTE — Telephone Encounter (Signed)
May hold 5 days prior to surgery. Does temporarily increase stroke risk- patient soul be informed but I believe benefits outweigh risks

## 2020-02-23 ENCOUNTER — Encounter: Payer: Self-pay | Admitting: Internal Medicine

## 2020-02-23 NOTE — Progress Notes (Signed)
HISTORY OF PRESENT ILLNESS:  [name] is a 69 y.o. female with multiple significant medical problems as listed below including aortic valve replacement and history of TIA for which she is on Plavix.  She is also diabetic.  She has been followed in this office for GERD and colon cancer screening.  She presents today with a chief complaint of intermittent solid food dysphagia and constipation.  Her last complete colonoscopy was performed February 2016.  This was normal her last upper endoscopy was performed February 2016.  She was found to have distal esophageal stricture which was dilated with 54 French Maloney dilator for chief complaint of dysphagia.  Patient states that she was doing well until the past year when she has developed recurrent intermittent solid food dysphagia.  At times severe and associated with pain.  She does take omeprazole 40 mg daily for her acid reflux.  This generally controls symptoms.  No change in weight.  For constipation she takes fiber in the form of psyllium.  She states she has been having some difficulty with constipation.  No bleeding.  Review of previous x-ray file shows ultrasound with normal gallbladder and gastric emptying scan with delay.  She is now status post cholecystectomy.  Review of laboratories from July 2021 shows sodium of 131.  Normal renal function.  CBC from May 2021 shows normal CBC with hemoglobin 14.0.  REVIEW OF SYSTEMS:  All non-GI ROS negative unless otherwise stated in the HPI except for sinus allergies, anxiety, arthritis, back pain, headaches, hearing problems  Past Medical History:  Diagnosis Date  . Allergy   . Anxiety   . Arthritis    back & knee  . Asthma     mild per pt shows up with resp illness  . Chronic diastolic congestive heart failure ([place])   . Colon polyps   . Constipation   . Diabetes mellitus without complication ([place])   . Dyspnea   . Gallstones   . Gastric polyps   . Gastroparesis   . GERD  (gastroesophageal reflux disease)   . Headache    sinus headaches and migraines at times  . Heart murmur   . History of migraine headaches   . HTN (hypertension)   . Hyperlipidemia   . IBS (irritable bowel syndrome)   . Joint pain   . Lumbar disc disease   . S/P aortic valve replacement with bioprosthetic valve 08/23/2016   25 mm Edwards Intuity rapid-deployment bovine pericardial tissue valve via partial upper mini sternotomy  . Sleep apnea    CPAP  . TIA (transient ischemic attack)     Past Surgical History:  Procedure Laterality Date  . ABDOMINAL HYSTERECTOMY    . AORTIC VALVE REPLACEMENT N/A 08/23/2016   Procedure: AORTIC VALVE REPLACEMENT (AVR) - using partial Upper Sternotomy- 73mm Edwards Intuity Aortic Valve used;  Surgeon: Rexene Alberts, MD;  Location: [facility];  Service: Open Heart Surgery;  Laterality: N/A;  . BLADDER SUSPENSION  2007  . BREAST ENHANCEMENT SURGERY  1980  . CARPAL TUNNEL RELEASE Bilateral   . COLONOSCOPY    . DORSAL COMPARTMENT RELEASE Right 09/15/2014   Procedure: RIGHT WRIST DEQUERVAINS RELEASE ;  Surgeon: Kathryne Hitch, MD;  Location: [facility];  Service: Orthopedics;  Laterality: Right;  . ESOPHAGOGASTRODUODENOSCOPY    . KNEE ARTHROSCOPY  04/15/2012   Procedure: ARTHROSCOPY KNEE;  Surgeon: [name], MD;  Location: [facility];  Service: Orthopedics;  Laterality: Right;  . LAPAROSCOPIC CHOLECYSTECTOMY    .  LASIK    . OOPHORECTOMY    . PALATE TO GINGIVA GRAFT  2017  . RIGHT/LEFT HEART CATH AND CORONARY ANGIOGRAPHY N/A 08/02/2016   Procedure: Right/Left Heart Cath and Coronary Angiography;  Surgeon: Sherren Mocha, MD;  Location: [facility];  Service: Cardiovascular;  Laterality: N/A;  . ROTATOR CUFF REPAIR Left   . TEE WITHOUT CARDIOVERSION N/A 08/23/2016   Procedure: TRANSESOPHAGEAL ECHOCARDIOGRAM (TEE);  Surgeon: Rexene Alberts, MD;  Location: [facility];  Service: Open Heart Surgery;  Laterality: N/A;  . TOTAL KNEE ARTHROPLASTY   04/15/2012   Procedure: TOTAL KNEE ARTHROPLASTY;  Surgeon: [name], MD;  Location: [facility];  Service: Orthopedics;  Laterality: Right;  . TRIGGER FINGER RELEASE Right 04/20/2015   Procedure: RIGHT TRIGGER FINGER RELEASE (TENDON SHEALTH INCISION) ;  Surgeon: [name], MD;  Location: [facility];  Service: Orthopedics;  Laterality: Right;    Social History [name]  reports that she has never smoked. She has never used smokeless tobacco. She reports that she does not drink alcohol and does not use drugs.  family history includes Alcohol abuse in her father; Anxiety disorder in her mother; Breast cancer in her maternal grandmother; COPD in her mother; Colon polyps in her maternal aunt and mother; Diabetes in her paternal uncle; Gallbladder disease in her maternal grandmother; Heart disease in her father; Irritable bowel syndrome in her mother.  Allergies  Allergen Reactions  . Augmentin [Amoxicillin-Pot Clavulanate] Rash    rash  . Erythromycin Nausea Only  . Penicillins Itching and Rash    Has patient had a PCN reaction causing immediate rash, facial/tongue/throat swelling, SOB or lightheadedness with hypotension: No Has patient had a PCN reaction causing severe rash involving mucus membranes or skin necrosis: No Has patient had a PCN reaction that required hospitalization: No Has patient had a PCN reaction occurring within the last 10 years: No  If all of the above answers are "NO", then may proceed with Cephalosporin use.        PHYSICAL EXAMINATION: Vital signs: BP 112/70 (BP Location: Left Arm, Patient Position: Sitting, Cuff Size: Normal)   Pulse 60   Ht 5' 4.75" (1.645 m) Comment: height measured without shoes  Wt 206 lb 4 oz (93.6 kg)   LMP  (LMP Unknown)   BMI 34.59 kg/m   Constitutional: generally well-appearing, no acute distress Psychiatric: alert and oriented x3, cooperative Eyes: extraocular movements intact, anicteric, conjunctiva  pink Mouth: oral pharynx moist, no lesions Neck: supple no lymphadenopathy Cardiovascular: heart regular rate and rhythm. Lungs: clear to auscultation bilaterally Abdomen: soft, obese, nontender, nondistended, no obvious ascites, no peritoneal signs, normal bowel sounds, no organomegaly Rectal: Omitted Extremities: no clubbing, cyanosis, or lower extremity edema bilaterally Skin: no lesions on visible extremities Neuro: No focal deficits.  Cranial nerves intact  ASSESSMENT:  1.  GERD.  Symptoms controlled with omeprazole 40 mg daily 2.  Recurrent intermittent solid food dysphagia.  Severe at times.  Likely secondary to recurrent peptic stricture 3.  Constipation predominant irritable bowel syndrome.  Ongoing 4.  Multiple significant medical problems including diabetes, history of TIA on Plavix, obesity and prior heart valve replacement 5.  Normal colonoscopy 2016  PLAN:  1.  Reflux precautions with attention to weight loss 2.  Continue omeprazole 40 mg daily 3.  Schedule upper endoscopy with esophageal dilation.  The patient is HIGH RISK given her comorbidities and the need to interrupt Plavix therapy, if medically reasonable, and adjust diabetic  medications to avoid unwanted hypoglycemia.The nature of the procedure, as well as the risks, benefits, and alternatives were carefully and thoroughly reviewed with the patient. Ample time for discussion and questions allowed. The patient understood, was satisfied, and agreed to proceed. 4.  We will check with her primary care provider, Dr. Yong Channel, to see if it is okay to hold her Plavix for 5 to 7 days prior to her therapeutic endoscopic procedure.  During that time I recommend that she initiate baby aspirin 81 mg to have some cerebrovascular protection off Plavix.  She agrees 5.  Follow-up screening colonoscopy 2026 6.  Recommend that she initiate MiraLAX for constipation.  Discussed how to titrate to achieve desired effect.  She understands

## 2020-02-24 NOTE — Telephone Encounter (Signed)
Spoke with patient and told her that per Dr. Yong Channel she could hold her Plavix for 5 days prior to her appointment.  She will take baby aspirin during that time.  Patient agreed.

## 2020-02-29 ENCOUNTER — Encounter: Payer: Self-pay | Admitting: Internal Medicine

## 2020-03-05 ENCOUNTER — Encounter: Payer: Self-pay | Admitting: Family Medicine

## 2020-03-09 ENCOUNTER — Encounter: Payer: Self-pay | Admitting: Internal Medicine

## 2020-03-09 ENCOUNTER — Ambulatory Visit (AMBULATORY_SURGERY_CENTER): Payer: Medicare PPO | Admitting: Internal Medicine

## 2020-03-09 ENCOUNTER — Other Ambulatory Visit: Payer: Self-pay

## 2020-03-09 ENCOUNTER — Ambulatory Visit (INDEPENDENT_AMBULATORY_CARE_PROVIDER_SITE_OTHER): Payer: Medicare PPO | Admitting: Physician Assistant

## 2020-03-09 VITALS — BP 145/77 | HR 60 | Temp 96.2°F | Resp 14 | Ht 64.0 in | Wt 206.0 lb

## 2020-03-09 DIAGNOSIS — K449 Diaphragmatic hernia without obstruction or gangrene: Secondary | ICD-10-CM | POA: Diagnosis not present

## 2020-03-09 DIAGNOSIS — K219 Gastro-esophageal reflux disease without esophagitis: Secondary | ICD-10-CM | POA: Diagnosis not present

## 2020-03-09 DIAGNOSIS — K222 Esophageal obstruction: Secondary | ICD-10-CM

## 2020-03-09 DIAGNOSIS — R1310 Dysphagia, unspecified: Secondary | ICD-10-CM | POA: Diagnosis not present

## 2020-03-09 MED ORDER — SODIUM CHLORIDE 0.9 % IV SOLN: 500.0000 mL | Freq: Once | INTRAVENOUS | Status: DC

## 2020-03-09 NOTE — Progress Notes (Signed)
Vital signs checked by:SH  The medical and surgical history was reviewed and verified with the patient.

## 2020-03-09 NOTE — Op Note (Signed)
[facility] Patient Name: [name] Procedure Date: 03/09/2020 10:17 AM MRN: [id number] Endoscopist: Docia Chuck. [name] , MD Age: 69 Referring MD:  Date of Birth: July 05, 1950 Gender: Female Account #: [id number] Procedure:                Upper GI endoscopy with biopsy; Maloney dilation of                            the esophagus?"13 [place] Indications:              Dysphagia, Therapeutic procedure Medicines:                Monitored Anesthesia Care Procedure:                Pre-Anesthesia Assessment:                           - Prior to the procedure, a History and Physical                            was performed, and patient medications and                            allergies were reviewed. The patient's tolerance of                            previous anesthesia was also reviewed. The risks                            and benefits of the procedure and the sedation                            options and risks were discussed with the patient.                            All questions were answered, and informed consent                            was obtained. Prior Anticoagulants: The patient has                            taken Plavix (clopidogrel), last dose was 5 days                            prior to procedure. ASA Grade Assessment: III - A                            patient with severe systemic disease. After                            reviewing the risks and benefits, the patient was                            deemed in satisfactory condition to undergo the  procedure.                           After obtaining informed consent, the endoscope was                            passed under direct vision. Throughout the                            procedure, the patient's blood pressure, pulse, and                            oxygen saturations were monitored continuously. The                            Endoscope was introduced through the mouth,  and                            advanced to the second part of duodenum. The upper                            GI endoscopy was accomplished without difficulty.                            The patient tolerated the procedure well. Scope In: Scope Out: Findings:                 One benign-appearing, intrinsic moderate stenosis                            was found 37 cm from the incisors. This stenosis                            measured 1.5 cm (inner diameter). After completing                            the endoscopic survey, the scope was withdrawn.                            Dilation was performed with a Maloney dilator with                            no resistance at 4 Fr.                           The exam of the esophagus was otherwise normal.                           The stomach revealed a small hiatal hernia and                            several diminutive antral erosions. Biopsies were                            taken with a cold  forceps for Helicobacter pylori                            testing using CLOtest.                           The examined duodenum was normal.                           The cardia and gastric fundus were normal on                            retroflexion. Complications:            No immediate complications. Estimated Blood Loss:     Estimated blood loss: none. Impression:               - Benign-appearing esophageal stenosis. Dilated.                           - Few antral erosions. Biopsied.                           - Normal examined duodenum. Recommendation:           - Patient has a contact number available for                            emergencies. The signs and symptoms of potential                            delayed complications were discussed with the                            patient. Return to normal activities tomorrow.                            Written discharge instructions were provided to the                            patient.                            - Post dilation diet.                           - Continue present medications.                           - Await pathology results.                           - Resume Plavix (clopidogrel) at prior dose today. Docia Chuck. [name], MD 03/09/2020 10:47:02 AM This report has been signed electronically.

## 2020-03-09 NOTE — Progress Notes (Signed)
PT taken to PACU. Monitors in place. VSS. Report given to RN. 

## 2020-03-09 NOTE — Progress Notes (Signed)
Called to room to assist during endoscopic procedure.  Patient ID and intended procedure confirmed with present staff. Received instructions for my participation in the procedure from the performing physician.  

## 2020-03-09 NOTE — Patient Instructions (Signed)
Please read handouts provided. Continue present medications. Resume Plavix ( clopidogrel ) at prior dose today. Await pathology results. Post Dilation Diet.      YOU HAD AN ENDOSCOPIC PROCEDURE TODAY AT [facility]:   Refer to the procedure report that was given to you for any specific questions about what was found during the examination.  If the procedure report does not answer your questions, please call your gastroenterologist to clarify.  If you requested that your care partner not be given the details of your procedure findings, then the procedure report has been included in a sealed envelope for you to review at your convenience later.  YOU SHOULD EXPECT: Some feelings of bloating in the abdomen. Passage of more gas than usual.  Walking can help get rid of the air that was put into your GI tract during the procedure and reduce the bloating. If you had a lower endoscopy (such as a colonoscopy or flexible sigmoidoscopy) you may notice spotting of blood in your stool or on the toilet paper. If you underwent a bowel prep for your procedure, you may not have a normal bowel movement for a few days.  Please Note:  You might notice some irritation and congestion in your nose or some drainage.  This is from the oxygen used during your procedure.  There is no need for concern and it should clear up in a day or so.  SYMPTOMS TO REPORT IMMEDIATELY:    Following upper endoscopy (EGD)  Vomiting of blood or coffee ground material  New chest pain or pain under the shoulder blades  Painful or persistently difficult swallowing  New shortness of breath  Fever of 100F or higher  Black, tarry-looking stools  For urgent or emergent issues, a gastroenterologist can be reached at any hour by calling [phone]. Do not use MyChart messaging for urgent concerns.    DIET:  Drink plenty of fluids but you should avoid alcoholic beverages for 24 hours.  ACTIVITY:  You should plan to  take it easy for the rest of today and you should NOT DRIVE or use heavy machinery until tomorrow (because of the sedation medicines used during the test).    FOLLOW UP: Our staff will call the number listed on your records 48-72 hours following your procedure to check on you and address any questions or concerns that you may have regarding the information given to you following your procedure. If we do not reach you, we will leave a message.  We will attempt to reach you two times.  During this call, we will ask if you have developed any symptoms of COVID 19. If you develop any symptoms (ie: fever, flu-like symptoms, shortness of breath, cough etc.) before then, please call [phone].  If you test positive for Covid 19 in the 2 weeks post procedure, please call and report this information to [name].    If any biopsies were taken you will be contacted by phone or by letter within the next 1-3 weeks.  Please call us at [phone] if you have not heard about the biopsies in 3 weeks.    SIGNATURES/CONFIDENTIALITY: You and/or your care partner have signed paperwork which will be entered into your electronic medical record.  These signatures attest to the fact that that the information above on your After Visit Summary has been reviewed and is understood.  Full responsibility of the confidentiality of this discharge information lies with you and/or your care-partner.

## 2020-03-10 LAB — HELICOBACTER PYLORI SCREEN-BIOPSY: UREASE: NEGATIVE

## 2020-03-13 ENCOUNTER — Telehealth: Payer: Self-pay | Admitting: *Deleted

## 2020-03-13 DIAGNOSIS — G4733 Obstructive sleep apnea (adult) (pediatric): Secondary | ICD-10-CM | POA: Diagnosis not present

## 2020-03-13 NOTE — Telephone Encounter (Signed)
Attempted 2nd f/u phone call. No answer. Left message.  °

## 2020-03-13 NOTE — Telephone Encounter (Signed)
  Follow up Call-  Call back number 03/09/2020  Post procedure Call Back phone  # [phone]  Permission to leave phone message Yes  Some recent data might be hidden     Patient questions:  Message left to call us if necessary.

## 2020-03-13 NOTE — Telephone Encounter (Signed)
Patient returned the call and states she is doing well no questions at this time. Patient is highly appreciative of the staff is happy with patient care.

## 2020-03-16 ENCOUNTER — Other Ambulatory Visit: Payer: Self-pay

## 2020-03-16 ENCOUNTER — Encounter (INDEPENDENT_AMBULATORY_CARE_PROVIDER_SITE_OTHER): Payer: Self-pay | Admitting: Physician Assistant

## 2020-03-16 ENCOUNTER — Encounter: Payer: Self-pay | Admitting: Family Medicine

## 2020-03-16 ENCOUNTER — Ambulatory Visit (INDEPENDENT_AMBULATORY_CARE_PROVIDER_SITE_OTHER): Payer: Medicare PPO | Admitting: Physician Assistant

## 2020-03-16 VITALS — BP 161/68 | HR 59 | Temp 98.5°F | Ht 66.0 in | Wt 199.0 lb

## 2020-03-16 DIAGNOSIS — E669 Obesity, unspecified: Secondary | ICD-10-CM

## 2020-03-16 DIAGNOSIS — Z6832 Body mass index (BMI) 32.0-32.9, adult: Secondary | ICD-10-CM

## 2020-03-16 DIAGNOSIS — G4733 Obstructive sleep apnea (adult) (pediatric): Secondary | ICD-10-CM

## 2020-03-16 DIAGNOSIS — E1169 Type 2 diabetes mellitus with other specified complication: Secondary | ICD-10-CM | POA: Diagnosis not present

## 2020-03-16 DIAGNOSIS — E785 Hyperlipidemia, unspecified: Secondary | ICD-10-CM

## 2020-03-16 DIAGNOSIS — Z9989 Dependence on other enabling machines and devices: Secondary | ICD-10-CM

## 2020-03-20 ENCOUNTER — Telehealth: Payer: Self-pay | Admitting: Internal Medicine

## 2020-03-20 NOTE — Telephone Encounter (Signed)
Helicobacter pylori testing was negative or normal.  This information also sent via patient messaging earlier today.

## 2020-03-20 NOTE — Telephone Encounter (Signed)
Pt calling for procedure results, please advise.

## 2020-03-20 NOTE — Progress Notes (Signed)
Chief Complaint:   OBESITY [name] is here to discuss her progress with her obesity treatment plan along with follow-up of her obesity related diagnoses. [name] is on keeping a food journal and adhering to recommended goals of 1400-1500 calories and 95 grams of protein and states she is following her eating plan approximately 95% of the time. [name] states she is exercising for 0 minutes 0 times per week.  Today's visit was #: 68 Starting weight: 220 lbs Starting date: 01/19/2018 Today's weight: 199 lbs Today's date: 03/16/2020 Total lbs lost to date: 21 lbs Total lbs lost since last in-office visit: 1 lb  Interim History: [name] had an endoscopy last week to dilate her esophagus, and a hiatal hernia was found.  She is staying in the low 1400s for calories and getting over 100 grams of protein.  Subjective:   1. Type 2 diabetes mellitus with hyperlipidemia (HCC) Medications reviewed. Diabetic ROS: no polyuria or polydipsia, no chest pain, dyspnea or TIA's, no numbness, tingling or pain in extremities.  Last A1c was 5.9.  Fasting blood sugar 116-131.  She is on metformin.  Hunger is controlled.  Lab Results  Component Value Date   HGBA1C 5.9 (A) 12/09/2019   HGBA1C 6.1 06/08/2019   HGBA1C 5.7 (A) 05/24/2019   Lab Results  Component Value Date   MICROALBUR <0.7 01/05/2019   LDLCALC 64 06/08/2019   CREATININE 0.62 12/28/2019   Lab Results  Component Value Date   INSULIN 12.1 01/19/2018   2. OSA on CPAP Jaylyne has a diagnosis of sleep apnea. She reports that she is using a CPAP regularly.  She is sleeping well.  Assessment/Plan:   1. Type 2 diabetes mellitus with hyperlipidemia (HCC) Good blood sugar control is important to decrease the likelihood of diabetic complications such as nephropathy, neuropathy, limb loss, blindness, coronary artery disease, and death. Intensive lifestyle modification including diet, exercise and weight loss are the first line  of treatment for diabetes.  Continue with medication and weight loss.  2. OSA on CPAP Intensive lifestyle modifications are the first line treatment for this issue. We discussed several lifestyle modifications today and she will continue to work on diet, exercise and weight loss efforts. We will continue to monitor. Orders and follow up as documented in patient record.  Continue wearing CPAP.  3. Class 1 obesity with serious comorbidity and body mass index (BMI) of 32.0 to 32.9 in adult, unspecified obesity type [name] is currently in the action stage of change. As such, her goal is to continue with weight loss efforts. She has agreed to keeping a food journal and adhering to recommended goals of 1400-1500 calories and 95 grams of protein.   Exercise goals: Older adults should follow the adult guidelines. When older adults cannot meet the adult guidelines, they should be as physically active as their abilities and conditions will allow.  Older adults should do exercises that maintain or improve balance if they are at risk of falling.   Behavioral modification strategies: meal planning and cooking strategies and keeping healthy foods in the home.  [name] has agreed to follow-up with our clinic in 3 weeks. She was informed of the importance of frequent follow-up visits to maximize her success with intensive lifestyle modifications for her multiple health conditions.   Objective:   Blood pressure (!) 161/68, pulse (!) 59, temperature 98.5 F (36.9 C), height 5\' 6"  (1.676 m), weight 199 lb (90.3 kg), SpO2 98 %. Body mass index is 32.12 kg/m.  General: Cooperative, alert, well developed, in no acute distress. HEENT: Conjunctivae and lids unremarkable. Cardiovascular: Regular rhythm.  Lungs: Normal work of breathing. Neurologic: No focal deficits.   Lab Results  Component Value Date   CREATININE 0.62 12/28/2019   BUN 18 12/28/2019   NA 131 (L) 12/28/2019   K 4.2 12/28/2019   CL 97  12/28/2019   CO2 28 12/28/2019   Lab Results  Component Value Date   ALT 22 11/18/2019   AST 34 11/18/2019   ALKPHOS 151 (H) 11/18/2019   BILITOT 1.0 11/18/2019   Lab Results  Component Value Date   HGBA1C 5.9 (A) 12/09/2019   HGBA1C 6.1 06/08/2019   HGBA1C 5.7 (A) 05/24/2019   HGBA1C 5.7 (A) 01/05/2019   HGBA1C 6.1 06/04/2018   Lab Results  Component Value Date   INSULIN 12.1 01/19/2018   Lab Results  Component Value Date   TSH 3.38 06/04/2018   Lab Results  Component Value Date   CHOL 131 06/08/2019   HDL 53.90 06/08/2019   LDLCALC 64 06/08/2019   LDLDIRECT 58.0 11/25/2017   TRIG 65.0 06/08/2019   CHOLHDL 2 06/08/2019   Lab Results  Component Value Date   WBC 9.8 11/18/2019   HGB 14.0 11/18/2019   HCT 41.2 11/18/2019   MCV 87.8 11/18/2019   PLT 281 11/18/2019   Obesity Behavioral Intervention:   Approximately 15 minutes were spent on the discussion below.  ASK: We discussed the diagnosis of obesity with [name] today and Jacyln agreed to give [name] permission to discuss obesity behavioral modification therapy today.  ASSESS: Amyrie has the diagnosis of obesity and her BMI today is 32.2. Leen is in the action stage of change.   ADVISE: Lissett was educated on the multiple health risks of obesity as well as the benefit of weight loss to improve her health. She was advised of the need for long term treatment and the importance of lifestyle modifications to improve her current health and to decrease her risk of future health problems.  AGREE: Multiple dietary modification options and treatment options were discussed and Kristianne agreed to follow the recommendations documented in the above note.  ARRANGE: Mihaela was educated on the importance of frequent visits to treat obesity as outlined per CMS and USPSTF guidelines and agreed to schedule her next follow up appointment today.  Attestation Statements:   Reviewed by clinician on day  of visit: allergies, medications, problem list, medical history, surgical history, family history, social history, and previous encounter notes.  I, Water quality scientist, CMA, am acting as transcriptionist for [name], PA-C  I have reviewed the above documentation for accuracy and completeness, and I agree with the above. [name], PA-C

## 2020-03-28 DIAGNOSIS — J3089 Other allergic rhinitis: Secondary | ICD-10-CM | POA: Diagnosis not present

## 2020-03-28 DIAGNOSIS — J300 Vasomotor rhinitis: Secondary | ICD-10-CM | POA: Diagnosis not present

## 2020-03-28 DIAGNOSIS — K219 Gastro-esophageal reflux disease without esophagitis: Secondary | ICD-10-CM | POA: Diagnosis not present

## 2020-03-28 DIAGNOSIS — R519 Headache, unspecified: Secondary | ICD-10-CM | POA: Diagnosis not present

## 2020-03-31 ENCOUNTER — Encounter: Payer: Self-pay | Admitting: Family Medicine

## 2020-04-05 ENCOUNTER — Telehealth: Payer: Self-pay | Admitting: Family Medicine

## 2020-04-05 NOTE — Telephone Encounter (Signed)
Left message for patient to call back and schedule Medicare Annual Wellness Visit (AWV) either virtually/audio only OR in office. Whatever the patients preference is.  Last AWV 03/09/19; please schedule at anytime with [facility] Ad[facility] at [facility].  This should be a 45 minute visit.

## 2020-04-06 ENCOUNTER — Encounter (INDEPENDENT_AMBULATORY_CARE_PROVIDER_SITE_OTHER): Payer: Self-pay | Admitting: Physician Assistant

## 2020-04-06 ENCOUNTER — Ambulatory Visit (INDEPENDENT_AMBULATORY_CARE_PROVIDER_SITE_OTHER): Payer: Medicare PPO | Admitting: Physician Assistant

## 2020-04-06 ENCOUNTER — Ambulatory Visit: Payer: Medicare PPO | Admitting: Endocrinology

## 2020-04-06 ENCOUNTER — Other Ambulatory Visit: Payer: Self-pay

## 2020-04-06 VITALS — BP 146/81 | HR 62 | Temp 98.4°F | Ht 66.0 in | Wt 199.0 lb

## 2020-04-06 DIAGNOSIS — E559 Vitamin D deficiency, unspecified: Secondary | ICD-10-CM | POA: Diagnosis not present

## 2020-04-06 DIAGNOSIS — E1169 Type 2 diabetes mellitus with other specified complication: Secondary | ICD-10-CM | POA: Diagnosis not present

## 2020-04-06 DIAGNOSIS — E669 Obesity, unspecified: Secondary | ICD-10-CM | POA: Diagnosis not present

## 2020-04-06 DIAGNOSIS — Z6832 Body mass index (BMI) 32.0-32.9, adult: Secondary | ICD-10-CM | POA: Diagnosis not present

## 2020-04-06 DIAGNOSIS — E785 Hyperlipidemia, unspecified: Secondary | ICD-10-CM

## 2020-04-06 NOTE — Progress Notes (Signed)
Chief Complaint:   OBESITY [name] is here to discuss her progress with her obesity treatment plan along with follow-up of her obesity related diagnoses. 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 [name] states she is walking at stores for exercise.   Today's visit was #: 22 Starting weight: 220 lbs Starting date: 01/19/2018 Today's weight: 199 lbs Today's date: 04/06/2020 Total lbs lost to date: 21 Total lbs lost since last in-office visit: 0  Interim History: [name] states she is averaging 1400 calories and over 100 grams of protein daily. She reports drinking plenty of water. She states that at 1400 calories her hunger is controlled. She is considering going back to the gym.   Subjective:   Type 2 diabetes mellitus with hyperlipidemia ([place]). [name] is on metformin and she reports hunger is controlled. Fasting blood sugars are in the range of 115 and 136. She is due for labs.  Lab Results  Component Value Date   HGBA1C 5.9 (A) 12/09/2019   HGBA1C 6.1 06/08/2019   HGBA1C 5.7 (A) 05/24/2019   Lab Results  Component Value Date   MICROALBUR <0.7 01/05/2019   LDLCALC 64 06/08/2019   CREATININE 0.62 12/28/2019   Lab Results  Component Value Date   INSULIN 12.1 01/19/2018   Vitamin D deficiency. [name] is on 5,000 units daily. She reports energy is good throughout the day.   Ref. Range 06/08/2019 09:49  VITD Latest Ref Range: 30.00 - 100.00 ng/mL 52.21   Assessment/Plan:   Type 2 diabetes mellitus with hyperlipidemia ([place]). Good blood sugar control is important to decrease the likelihood of diabetic complications such as nephropathy, neuropathy, limb loss, blindness, coronary artery disease, and death. Intensive lifestyle modification including diet, exercise and weight loss are the first line of  treatment for diabetes. Berlene will continue her medication as directed.   Vitamin D deficiency. Low Vitamin D level contributes to fatigue and are associated with obesity, breast, and colon cancer. She agrees to continue to take Vitamin D as directed and will follow-up for routine testing of Vitamin D, at least 2-3 times per year to avoid over-replacement.  Class 1 obesity with serious comorbidity and body mass index (BMI) of 32.0 to 32.9 in adult, unspecified obesity type.  [name] is currently in the action stage of change. As such, her goal is to continue with weight loss efforts. She has agreed to keeping a food journal and adhering to recommended goals of 1400 calories and 95 grams of protein daily.   Labs and IC will be checked at her next visit.  Exercise goals: Older adults should follow the adult guidelines. When older adults cannot meet the adult guidelines, they should be as physically active as their abilities and conditions will allow.   Behavioral modification strategies: meal planning and cooking strategies and keeping a strict food journal.  [name] has agreed to follow-up with our clinic in 2-3 weeks. She was informed of the importance of frequent follow-up visits to maximize her success with intensive lifestyle modifications for her multiple health conditions.   Objective:   Blood pressure (!) 146/81, pulse 62, temperature 98.4 F (36.9 C), height 5\' 6"  (1.676 m), weight 199 lb (90.3 kg), SpO2 98 %. Body mass index is 32.12 kg/m.  General: Cooperative, alert, well developed, in no acute distress. HEENT: Conjunctivae and lids unremarkable. Cardiovascular:  Regular rhythm.  Lungs: Normal work of breathing. Neurologic: No focal deficits.   Lab Results  Component Value Date   CREATININE 0.62 12/28/2019   BUN 18 12/28/2019   NA 131 (L) 12/28/2019   K 4.2 12/28/2019   CL 97 12/28/2019   CO2 28 12/28/2019   Lab Results  Component Value Date   ALT 22  11/18/2019   AST 34 11/18/2019   ALKPHOS 151 (H) 11/18/2019   BILITOT 1.0 11/18/2019   Lab Results  Component Value Date   HGBA1C 5.9 (A) 12/09/2019   HGBA1C 6.1 06/08/2019   HGBA1C 5.7 (A) 05/24/2019   HGBA1C 5.7 (A) 01/05/2019   HGBA1C 6.1 06/04/2018   Lab Results  Component Value Date   INSULIN 12.1 01/19/2018   Lab Results  Component Value Date   TSH 3.38 06/04/2018   Lab Results  Component Value Date   CHOL 131 06/08/2019   HDL 53.90 06/08/2019   LDLCALC 64 06/08/2019   LDLDIRECT 58.0 11/25/2017   TRIG 65.0 06/08/2019   CHOLHDL 2 06/08/2019   Lab Results  Component Value Date   WBC 9.8 11/18/2019   HGB 14.0 11/18/2019   HCT 41.2 11/18/2019   MCV 87.8 11/18/2019   PLT 281 11/18/2019   No results found for: IRON, TIBC, FERRITIN  Obesity Behavioral Intervention:   Approximately 15 minutes were spent on the discussion below.  ASK: We discussed the diagnosis of obesity with [name] today and [name] agreed to give [name] permission to discuss obesity behavioral modification therapy today.  ASSESS: [name] has the diagnosis of obesity and her BMI today is 32.2. [name] is in the action stage of change.   ADVISE: [name] was educated on the multiple health risks of obesity as well as the benefit of weight loss to improve her health. She was advised of the need for long term treatment and the importance of lifestyle modifications to improve her current health and to decrease her risk of future health problems.  AGREE: Multiple dietary modification options and treatment options were discussed and [name] agreed to follow the recommendations documented in the above note.  ARRANGE: [name] was educated on the importance of frequent visits to treat obesity as outlined per CMS and USPSTF guidelines and agreed to schedule her next follow up appointment today.  Attestation Statements:   Reviewed by clinician on day of visit: allergies, medications,  problem list, medical history, surgical history, family history, social history, and previous encounter notes.  IMichaelene Song, am acting as transcriptionist for [name], PA-C   I have reviewed the above documentation for accuracy and completeness, and I agree with the above. [name], PA-C

## 2020-04-10 NOTE — Telephone Encounter (Signed)
Please advise 

## 2020-04-11 DIAGNOSIS — G4733 Obstructive sleep apnea (adult) (pediatric): Secondary | ICD-10-CM | POA: Diagnosis not present

## 2020-04-18 DIAGNOSIS — Z961 Presence of intraocular lens: Secondary | ICD-10-CM | POA: Diagnosis not present

## 2020-04-20 ENCOUNTER — Other Ambulatory Visit: Payer: Self-pay

## 2020-04-20 ENCOUNTER — Encounter (INDEPENDENT_AMBULATORY_CARE_PROVIDER_SITE_OTHER): Payer: Self-pay | Admitting: Physician Assistant

## 2020-04-20 ENCOUNTER — Ambulatory Visit (INDEPENDENT_AMBULATORY_CARE_PROVIDER_SITE_OTHER): Payer: Medicare PPO | Admitting: Physician Assistant

## 2020-04-20 ENCOUNTER — Ambulatory Visit: Payer: Medicare PPO

## 2020-04-20 VITALS — BP 134/66 | HR 72 | Temp 98.5°F | Ht 66.0 in | Wt 196.0 lb

## 2020-04-20 DIAGNOSIS — E669 Obesity, unspecified: Secondary | ICD-10-CM | POA: Diagnosis not present

## 2020-04-20 DIAGNOSIS — Z6831 Body mass index (BMI) 31.0-31.9, adult: Secondary | ICD-10-CM | POA: Diagnosis not present

## 2020-04-20 DIAGNOSIS — E1169 Type 2 diabetes mellitus with other specified complication: Secondary | ICD-10-CM

## 2020-04-20 DIAGNOSIS — E559 Vitamin D deficiency, unspecified: Secondary | ICD-10-CM

## 2020-04-20 DIAGNOSIS — R0602 Shortness of breath: Secondary | ICD-10-CM | POA: Diagnosis not present

## 2020-04-20 DIAGNOSIS — E7849 Other hyperlipidemia: Secondary | ICD-10-CM

## 2020-04-20 DIAGNOSIS — E785 Hyperlipidemia, unspecified: Secondary | ICD-10-CM | POA: Diagnosis not present

## 2020-04-20 NOTE — Progress Notes (Signed)
Chief Complaint:   OBESITY [name] is here to discuss her progress with her obesity treatment plan along with follow-up of her obesity related diagnoses. 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 [name] states she is walking 20 minutes 3-4 times per week.  Today's visit was #: 72 Starting weight: 220 lbs Starting date: 01/19/2018 Today's weight: 196 lbs Today's date: 04/20/2020 Total lbs lost to date: 24 Total lbs lost since last in-office visit: 3  Interim History: [name] reports that she is sometimes hungry during the day. She is averaging between 1400-1500 calories and 120 grams of protein daily. Check of her IC today reveals an increase in her metabolism to 2428.  Subjective:   Type 2 diabetes mellitus with other specified complication, without long-term current use of insulin ([place]).  Fasting blood sugars 117-135. [name] is on metformin. No nausea, vomiting, or diarrhea. She sees Dr. Renato Shin.   Lab Results  Component Value Date   HGBA1C 5.9 (A) 12/09/2019   HGBA1C 6.1 06/08/2019   HGBA1C 5.7 (A) 05/24/2019   Lab Results  Component Value Date   MICROALBUR <0.7 01/05/2019   LDLCALC 64 06/08/2019   CREATININE 0.62 12/28/2019   Lab Results  Component Value Date   INSULIN 12.1 01/19/2018   SOB (shortness of breath). [name] denies dizziness or lightheadedness.  Other hyperlipidemia. [name] is on Lipitor. No chest pain or myalgias. She is due for labs.   Lab Results  Component Value Date   CHOL 131 06/08/2019   HDL 53.90 06/08/2019   LDLCALC 64 06/08/2019   LDLDIRECT 58.0 11/25/2017   TRIG 65.0 06/08/2019   CHOLHDL 2 06/08/2019   Lab Results  Component Value Date   ALT 22 11/18/2019   AST 34 11/18/2019   ALKPHOS 151 (H) 11/18/2019   BILITOT 1.0 11/18/2019   The ASCVD Risk  score [name]., et al., 2013) failed to calculate for the following reasons:   The patient has a prior MI or stroke diagnosis  Vitamin D deficiency.  [name] is on Vitamin D 5,000 units daily.   Ref. Range 06/08/2019 09:49  VITD Latest Ref Range: 30.00 - 100.00 ng/mL 52.21   Assessment/Plan:   Type 2 diabetes mellitus with other specified complication, without long-term current use of insulin ([place]).  Good blood sugar control is important to decrease the likelihood of diabetic complications such as nephropathy, neuropathy, limb loss, blindness, coronary artery disease, and death. Intensive lifestyle modification including diet, exercise and weight loss are the first line of treatment for diabetes. [name] will continue her medications as directed. Labs will be checked today.   SOB (shortness of breath). [name]'s shortness of breath appears to be obesity related and exercise induced. She has agreed to work on weight loss and gradually increase exercise to treat her exercise induced shortness of breath. Will continue to monitor closely. IC was checked today.  Other hyperlipidemia. Cardiovascular risk and specific lipid/LDL goals reviewed.  We discussed several lifestyle modifications today and [name] will continue to work on diet, exercise and weight loss efforts. Orders and follow up as documented in patient record. She will continue her medication as directed. Labs will be checked today.   Counseling Intensive lifestyle modifications are the first line treatment for this issue. . Dietary changes: Increase soluble fiber. Decrease simple carbohydrates. . Exercise changes: Moderate to  vigorous-intensity aerobic activity 150 minutes per week if tolerated. . Lipid-lowering medications: see documented in medical record.  Vitamin D deficiency. Low Vitamin D level contributes to fatigue and are associated with obesity, breast, and colon cancer. She agrees to continue to take Vitamin D  as directed and VITAMIN D 25 Hydroxy (Vit-D Deficiency, Fractures) level will be checked today.   Class 1 obesity with serious comorbidity and body mass index (BMI) of 31.0 to 31.9 in adult, unspecified obesity type.  [name] is currently in the action stage of change. As such, her goal is to continue with weight loss efforts. She has agreed to keeping a food journal and adhering to recommended goals of 1500-1800 calories and 115 grams of protein daily.   Exercise goals: Older adults should follow the adult guidelines. When older adults cannot meet the adult guidelines, they should be as physically active as their abilities and conditions will allow.   Behavioral modification strategies: planning for success and keeping a strict food journal.  [name] has agreed to follow-up with our clinic in 2 weeks. She was informed of the importance of frequent follow-up visits to maximize her success with intensive lifestyle modifications for her multiple health conditions.   [name] was informed we would discuss her lab results at her next visit unless there is a critical issue that needs to be addressed sooner. [name] agreed to keep her next visit at the agreed upon time to discuss these results.  Objective:   Blood pressure 134/66, pulse 72, temperature 98.5 F (36.9 C), temperature source Oral, height 5\' 6"  (1.676 m), weight 196 lb (88.9 kg), SpO2 99 %. Body mass index is 31.64 kg/m.  General: Cooperative, alert, well developed, in no acute distress. HEENT: Conjunctivae and lids unremarkable. Cardiovascular: Regular rhythm.  Lungs: Normal work of breathing. Neurologic: No focal deficits.   Lab Results  Component Value Date   CREATININE 0.62 12/28/2019   BUN 18 12/28/2019   NA 131 (L) 12/28/2019   K 4.2 12/28/2019   CL 97 12/28/2019   CO2 28 12/28/2019   Lab Results  Component Value Date   ALT 22 11/18/2019   AST 34 11/18/2019   ALKPHOS 151 (H) 11/18/2019   BILITOT 1.0  11/18/2019   Lab Results  Component Value Date   HGBA1C 5.9 (A) 12/09/2019   HGBA1C 6.1 06/08/2019   HGBA1C 5.7 (A) 05/24/2019   HGBA1C 5.7 (A) 01/05/2019   HGBA1C 6.1 06/04/2018   Lab Results  Component Value Date   INSULIN 12.1 01/19/2018   Lab Results  Component Value Date   TSH 3.38 06/04/2018   Lab Results  Component Value Date   CHOL 131 06/08/2019   HDL 53.90 06/08/2019   LDLCALC 64 06/08/2019   LDLDIRECT 58.0 11/25/2017   TRIG 65.0 06/08/2019   CHOLHDL 2 06/08/2019   Lab Results  Component Value Date   WBC 9.8 11/18/2019   HGB 14.0 11/18/2019   HCT 41.2 11/18/2019   MCV 87.8 11/18/2019   PLT 281 11/18/2019   No results found for: IRON, TIBC, FERRITIN  Obesity Behavioral Intervention:   Approximately 15 minutes were spent on the discussion below.  ASK: We discussed the diagnosis of obesity with [name] today and Hani agreed to give [name] permission to discuss obesity behavioral modification therapy today.  ASSESS: Cali has the diagnosis of obesity and her BMI today is 31.6. Erikah is in the action stage of change.   ADVISE: Merle was educated on the multiple health risks of obesity  as well as the benefit of weight loss to improve her health. She was advised of the need for long term treatment and the importance of lifestyle modifications to improve her current health and to decrease her risk of future health problems.  AGREE: Multiple dietary modification options and treatment options were discussed and Eulamae agreed to follow the recommendations documented in the above note.  ARRANGE: Briane was educated on the importance of frequent visits to treat obesity as outlined per CMS and USPSTF guidelines and agreed to schedule her next follow up appointment today.  Attestation Statements:   Reviewed by clinician on day of visit: allergies, medications, problem list, medical history, surgical history, family history, social  history, and previous encounter notes.  IMichaelene Song, am acting as transcriptionist for [name], PA-C   I have reviewed the above documentation for accuracy and completeness, and I agree with the above. [name], PA-C

## 2020-04-21 ENCOUNTER — Ambulatory Visit: Payer: Medicare PPO | Admitting: Endocrinology

## 2020-04-21 ENCOUNTER — Ambulatory Visit (INDEPENDENT_AMBULATORY_CARE_PROVIDER_SITE_OTHER): Payer: Medicare PPO

## 2020-04-21 ENCOUNTER — Encounter: Payer: Self-pay | Admitting: Endocrinology

## 2020-04-21 DIAGNOSIS — Z0000 Encounter for general adult medical examination without abnormal findings: Secondary | ICD-10-CM | POA: Diagnosis not present

## 2020-04-21 DIAGNOSIS — E871 Hypo-osmolality and hyponatremia: Secondary | ICD-10-CM | POA: Diagnosis not present

## 2020-04-21 LAB — BRAIN NATRIURETIC PEPTIDE: Pro B Natriuretic peptide (BNP): 42 pg/mL (ref 0.0–100.0)

## 2020-04-21 LAB — LIPID PANEL
Chol/HDL Ratio: 2.2 ratio (ref 0.0–4.4)
Cholesterol, Total: 141 mg/dL (ref 100–199)
HDL: 65 mg/dL (ref >39)
LDL Chol Calc (NIH): 63 mg/dL (ref 0–99)
Triglycerides: 66 mg/dL (ref 0–149)
VLDL Cholesterol Cal: 13 mg/dL (ref 5–40)

## 2020-04-21 LAB — COMPREHENSIVE METABOLIC PANEL
ALT: 18 IU/L (ref 0–32)
AST: 32 IU/L (ref 0–40)
Albumin/Globulin Ratio: 2.1 (ref 1.2–2.2)
Albumin: 4.6 g/dL (ref 3.8–4.8)
Alkaline Phosphatase: 171 IU/L — ABNORMAL HIGH (ref 44–121)
BUN/Creatinine Ratio: 35 — ABNORMAL HIGH (ref 12–28)
BUN: 19 mg/dL (ref 8–27)
Bilirubin Total: 0.5 mg/dL (ref 0.0–1.2)
CO2: 23 mmol/L (ref 20–29)
Calcium: 9.4 mg/dL (ref 8.7–10.3)
Chloride: 95 mmol/L — ABNORMAL LOW (ref 96–106)
Creatinine, Ser: 0.54 mg/dL — ABNORMAL LOW (ref 0.57–1.00)
GFR calc Af Amer: 111 mL/min/{1.73_m2} (ref >59)
GFR calc non Af Amer: 97 mL/min/{1.73_m2} (ref >59)
Globulin, Total: 2.2 g/dL (ref 1.5–4.5)
Glucose: 111 mg/dL — ABNORMAL HIGH (ref 65–99)
Potassium: 4.7 mmol/L (ref 3.5–5.2)
Sodium: 133 mmol/L — ABNORMAL LOW (ref 134–144)
Total Protein: 6.8 g/dL (ref 6.0–8.5)

## 2020-04-21 LAB — HEMOGLOBIN A1C
Est. average glucose Bld gHb Est-mCnc: 117 mg/dL
Hgb A1c MFr Bld: 5.7 % — ABNORMAL HIGH (ref 4.8–5.6)

## 2020-04-21 LAB — INSULIN, RANDOM: INSULIN: 13.1 u[IU]/mL (ref 2.6–24.9)

## 2020-04-21 LAB — VITAMIN D 25 HYDROXY (VIT D DEFICIENCY, FRACTURES): Vit D, 25-Hydroxy: 66.5 ng/mL (ref 30.0–100.0)

## 2020-04-21 NOTE — Progress Notes (Signed)
Subjective:    Patient ID: [name], female    DOB: 1950/06/27, 69 y.o.   MRN: [id number]  HPI Pt returns for f/u of hyponatremia (dx'ed 2013; only cause found was metaclopramide, which she cannot safely stop; she takes demeclocycline; ACTH stim test, creat, and TFT were normal; MRI showed old CVA's; NaCl 4 g QD was added 6/21; pt says IBS-C requires her to drink fluids).   Pt also has h/o elev AP (etiol is uncertain; isoenzymes suggested bone cause; bone scan showed multifocal degenerative changes. No evidence of acute or destructive process; plan is to follow).   Past Medical History:  Diagnosis Date  . Allergy   . Anxiety   . Arthritis    back & knee  . Asthma     mild per pt shows up with resp illness  . Colon polyps   . Constipation   . Diabetes mellitus without complication ([place])   . Dyspnea   . Gallstones   . Gastric polyps   . Gastroparesis   . GERD (gastroesophageal reflux disease)   . Headache    sinus headaches and migraines at times  . Heart murmur   . History of migraine headaches   . HTN (hypertension)   . Hyperlipidemia   . IBS (irritable bowel syndrome)   . Joint pain   . Lumbar disc disease   . S/P aortic valve replacement with bioprosthetic valve 08/23/2016   25 mm Edwards Intuity rapid-deployment bovine pericardial tissue valve via partial upper mini sternotomy  . Sleep apnea    CPAP  . TIA (transient ischemic attack)     Past Surgical History:  Procedure Laterality Date  . ABDOMINAL HYSTERECTOMY    . AORTIC VALVE REPLACEMENT N/A 08/23/2016   Procedure: AORTIC VALVE REPLACEMENT (AVR) - using partial Upper Sternotomy- 62m Edwards Intuity Aortic Valve used;  Surgeon: CRexene Alberts MD;  [facility] [facility]  Service: Open Heart Surgery;  Laterality: N/A;  . BLADDER SUSPENSION  2007  . BREAST ENHANCEMENT SURGERY  1980  . CARPAL TUNNEL RELEASE Bilateral   . COLONOSCOPY    . DORSAL COMPARTMENT RELEASE Right 09/15/2014   Procedure: RIGHT WRIST  DEQUERVAINS RELEASE ;  Surgeon: DKathryne Hitch MD;  [place] [facility]  Service: Orthopedics;  Laterality: Right;  . ESOPHAGOGASTRODUODENOSCOPY    . EYE SURGERY     cataract surgery  . KNEE ARTHROSCOPY  04/15/2012   Procedure: ARTHROSCOPY KNEE;  Surgeon: DNinetta Lights MD;  [place] [facility]  Service: Orthopedics;  Laterality: Right;  . LAPAROSCOPIC CHOLECYSTECTOMY    . LASIK    . OOPHORECTOMY    . PALATE TO GINGIVA GRAFT  2017  . RIGHT/LEFT HEART CATH AND CORONARY ANGIOGRAPHY N/A 08/02/2016   Procedure: Right/Left Heart Cath and Coronary Angiography;  Surgeon: MSherren Mocha MD;  [facility];  Service: Cardiovascular;  Laterality: N/A;  . ROTATOR CUFF REPAIR Left   . TEE WITHOUT CARDIOVERSION N/A 08/23/2016   Procedure: TRANSESOPHAGEAL ECHOCARDIOGRAM (TEE);  Surgeon: CRexene Alberts MD;  [place] [facility]  Service: Open Heart Surgery;  Laterality: N/A;  . TOTAL KNEE ARTHROPLASTY  04/15/2012   Procedure: TOTAL KNEE ARTHROPLASTY;  Surgeon: DNinetta Lights MD;  [place] [facility]  Service: Orthopedics;  Laterality: Right;  . TRIGGER FINGER RELEASE Right 04/20/2015   Procedure: RIGHT TRIGGER FINGER RELEASE (TENDON SHEALTH INCISION) ;  Surgeon: DNinetta Lights MD;  [place] [facility]  Service: Orthopedics;  Laterality: Right;  Social History   Socioeconomic History  . Marital status: Married    Spouse name: [name] Resides  . Number of children: 1  . Years of education: Not on file  . Highest education level: Not on file  Occupational History  . Occupation: Retired, Curator: RETIRED  Tobacco Use  . Smoking status: Never Smoker  . Smokeless tobacco: Never Used  Vaping Use  . Vaping Use: Never used  Substance and Sexual Activity  . Alcohol use: No  . Drug use: No  . Sexual activity: Yes  Other Topics Concern  . Not on file  Social History Narrative   She lives with husband (1978) and two dogs. Step-son [name] (1971-psychiatrist in [place]). No grandkids. 2 dogs-sheltie/collie mix and border collie mix      Highest level of education:  Master in education   She is retired Animal nutritionist x 32 years.      Hobbies: time with dogs   Right handed   One story home         Social Determinants of Health   Financial Resource Strain: Low Risk   . Difficulty of Paying Living Expenses: Not hard at all  Food Insecurity: No Food Insecurity  . Worried About Charity fundraiser in the Last Year: Never true  . Ran Out of Food in the Last Year: Never true  Transportation Needs: No Transportation Needs  . Lack of Transportation (Medical): No  . Lack of Transportation (Non-Medical): No  Physical Activity: Insufficiently Active  . Days of Exercise per Week: 3 days  . Minutes of Exercise per Session: 20 min  Stress: No Stress Concern Present  . Feeling of Stress : Not at all  Social Connections: Moderately Isolated  . Frequency of Communication with Friends and Family: More than three times a week  . Frequency of Social Gatherings with Friends and Family: Once a week  . Attends Religious Services: Never  . Active Member of Clubs or Organizations: No  . Attends Archivist Meetings: Never  . Marital Status: Married  Human resources officer Violence: Not At Risk  . Fear of Current or Ex-Partner: No  . Emotionally Abused: No  . Physically Abused: No  . Sexually Abused: No    Current Outpatient Medications on File Prior to Visit  Medication Sig Dispense Refill  . atorvastatin (LIPITOR) 40 MG tablet TAKE 1 TABLET (40 MG TOTAL) BY MOUTH DAILY AT 6 PM. 90 tablet 3  . Biotin 5000 MCG TABS Take 5,000 mcg by mouth daily.     . blood glucose meter kit and supplies KIT Dispense based on patient and insurance preference. Use up to four times daily as directed. Dx: E11.9 1 each 0  . calcium-vitamin D (OSCAL WITH D) 500-200 MG-UNIT per tablet Take 2 tablets by mouth daily.     . Cholecalciferol (VITAMIN  D) 125 MCG (5000 UT) CAPS Take 5,000 Units by mouth daily.    . clopidogrel (PLAVIX) 75 MG tablet Take 1 tablet (75 mg total) by mouth daily. 90 tablet 3  . demeclocycline (DECLOMYCIN) 300 MG tablet Take 1 tablet (300 mg total) by mouth 2 (two) times daily. 180 tablet 3  . fluticasone (FLONASE) 50 MCG/ACT nasal spray Place 2 sprays into both nostrils daily.     [name] glucose blood test strip Use to test blood sugars daily. Dx: E11.9 100 each 12  . ipratropium (ATROVENT) 0.03 % nasal spray 2 SPRAYS IN EACH NOSTRIL  AS NEEDED THREE TIMES AS NEEDED    . loratadine (CLARITIN) 10 MG tablet Take 10 mg by mouth at bedtime.     [name] losartan (COZAAR) 25 MG tablet Take 1 tablet (25 mg total) by mouth daily. 90 tablet 2  . Melatonin 5 MG TABS Take by mouth.     . metFORMIN (GLUCOPHAGE) 500 MG tablet TAKE 1 TABLET BY MOUTH WITH BREAKFAST AND 1/2-1 WITH DINNER 180 tablet 1  . metoCLOPramide (REGLAN) 10 MG tablet TAKE 1/2 TABLET BY MOUTH 4 TIMES A DAY 60 tablet 3  . Multiple Vitamin (MULTIVITAMIN WITH MINERALS) TABS tablet Take 1 tablet by mouth daily.    . naproxen sodium (ALEVE) 220 MG tablet Take 220 mg by mouth.    . OMEPRAZOLE MAGNESIUM PO Take 40 mg by mouth daily with breakfast.     . ondansetron (ZOFRAN ODT) 4 MG disintegrating tablet Take 1 tablet (4 mg total) by mouth every 8 (eight) hours as needed. 10 tablet 0  . Probiotic Product (ALIGN) 4 MG CAPS Take 4 mg by mouth daily.     . psyllium (METAMUCIL SMOOTH TEXTURE) 28 % packet Take 1 packet by mouth daily before breakfast.     . sodium chloride 1 g tablet Take 4 tablets (4 g total) by mouth daily. 120 tablet 5  . venlafaxine XR (EFFEXOR-XR) 75 MG 24 hr capsule TAKE 1 CAPSULE (75 MG TOTAL) BY MOUTH DAILY WITH BREAKFAST. 90 capsule 2   No current facility-administered medications on file prior to visit.    Allergies  Allergen Reactions  . Augmentin [Amoxicillin-Pot Clavulanate] Rash    rash  . Erythromycin Nausea Only  . Penicillins Itching and Rash     Has patient had a PCN reaction causing immediate rash, facial/tongue/throat swelling, SOB or lightheadedness with hypotension: No Has patient had a PCN reaction causing severe rash involving mucus membranes or skin necrosis: No Has patient had a PCN reaction that required hospitalization: No Has patient had a PCN reaction occurring within the last 10 years: No  If all of the above answers are "NO", then may proceed with Cephalosporin use.     Family History  Problem Relation Age of Onset  . COPD Mother   . Colon polyps Mother   . Irritable bowel syndrome Mother   . Anxiety disorder Mother   . Heart disease Father   . Alcohol abuse Father   . Breast cancer Maternal Grandmother   . Gallbladder disease Maternal Grandmother   . Colon polyps Maternal Aunt   . Diabetes Paternal Uncle        several uncles  . Other Neg Hx        hyponatremia  . Colon cancer Neg Hx   . Esophageal cancer Neg Hx   . Rectal cancer Neg Hx   . Stomach cancer Neg Hx     BP 132/68   Pulse 62   Ht 5' 6"  (1.676 m)   Wt 206 lb (93.4 kg)   LMP  (LMP Unknown)   SpO2 98%   BMI 33.25 kg/m    Review of Systems Denies sob.      Objective:   Physical Exam VITAL SIGNS:  See vs page GENERAL: no distress.  CV: no leg edema     Lab Results  Component Value Date   ALT 18 04/20/2020   AST 32 04/20/2020   ALKPHOS 171 (H) 04/20/2020   BILITOT 0.5 04/20/2020   BNP=42    Assessment & Plan:  Hyponatremia:  uncontrolled: increase NaCl to 6g QD elev AP, stable: we'll follow.  Please come back for a follow-up appointment in 3 months

## 2020-04-21 NOTE — Progress Notes (Signed)
Virtual Visit via Telephone Note  I connected with  [name] on 04/21/20 at 11:00 AM EDT by telephone and verified that I am speaking with the correct person using two identifiers.  Medicare Annual Wellness visit completed telephonically due to Covid-19 pandemic.   Persons participating in this call: This Health Coach and this patient.   Location: Patient: Home Provider: Office   I discussed the limitations, risks, security and privacy concerns of performing an evaluation and management service by telephone and the availability of in person appointments. The patient expressed understanding and agreed to proceed.  Unable to perform video visit due to video visit attempted and failed and/or patient does not have video capability.   Some vital signs may be absent or patient reported.   Hayley Brace, LPN    Subjective:   [name] is a 69 y.o. female who presents for Medicare Annual (Subsequent) preventive examination.  Review of Systems     Cardiac Risk Factors include: advanced age (>86mn, >>24women);diabetes mellitus;obesity (BMI >30kg/m2);hypertension;dyslipidemia     Objective:    There were no vitals filed for this visit. There is no height or weight on file to calculate BMI.  Advanced Directives 04/21/2020 11/18/2019 07/20/2019 04/20/2019 03/09/2019 11/05/2017 08/23/2016  Does Patient Have a Medical Advance Directive? Yes No No No Yes No Yes  Type of AParamedicof ARienziLiving will - - - Living will - Living will  Does patient want to make changes to medical advance directive? - - - - No - Patient declined - No - Patient declined  Copy of [place]? No - copy requested - - - - - -  Would patient like information on creating a medical advance directive? - - - - - No - Patient declined -  Pre-existing out of facility DNR order (yellow form or pink MOST form) - - - - - - -    Current Medications  (verified) Outpatient Encounter Medications as of 04/21/2020  Medication Sig  . atorvastatin (LIPITOR) 40 MG tablet TAKE 1 TABLET (40 MG TOTAL) BY MOUTH DAILY AT 6 PM.  . Biotin 5000 MCG TABS Take 5,000 mcg by mouth daily.   . blood glucose meter kit and supplies KIT Dispense based on patient and insurance preference. Use up to four times daily as directed. Dx: E11.9  . calcium-vitamin D (OSCAL WITH D) 500-200 MG-UNIT per tablet Take 2 tablets by mouth daily.   . Cholecalciferol (VITAMIN D) 125 MCG (5000 UT) CAPS Take 5,000 Units by mouth daily.  . clopidogrel (PLAVIX) 75 MG tablet Take 1 tablet (75 mg total) by mouth daily.  .Marland Kitchendemeclocycline (DECLOMYCIN) 300 MG tablet Take 1 tablet (300 mg total) by mouth 2 (two) times daily.  . fluticasone (FLONASE) 50 MCG/ACT nasal spray Place 2 sprays into both nostrils daily.   .Marland Kitchenglucose blood test strip Use to test blood sugars daily. Dx: E11.9  . ipratropium (ATROVENT) 0.03 % nasal spray 2 SPRAYS IN EACH NOSTRIL AS NEEDED THREE TIMES AS NEEDED  . loratadine (CLARITIN) 10 MG tablet Take 10 mg by mouth at bedtime.   .Marland Kitchenlosartan (COZAAR) 25 MG tablet Take 1 tablet (25 mg total) by mouth daily.  . Melatonin 5 MG TABS Take by mouth.   . metFORMIN (GLUCOPHAGE) 500 MG tablet TAKE 1 TABLET BY MOUTH WITH BREAKFAST AND 1/2-1 WITH DINNER  . metoCLOPramide (REGLAN) 10 MG tablet TAKE 1/2 TABLET BY MOUTH 4 TIMES A DAY  .  Multiple Vitamin (MULTIVITAMIN WITH MINERALS) TABS tablet Take 1 tablet by mouth daily.  . naproxen sodium (ALEVE) 220 MG tablet Take 220 mg by mouth.  . OMEPRAZOLE MAGNESIUM PO Take 40 mg by mouth daily with breakfast.   . ondansetron (ZOFRAN ODT) 4 MG disintegrating tablet Take 1 tablet (4 mg total) by mouth every 8 (eight) hours as needed.  . Probiotic Product (ALIGN) 4 MG CAPS Take 4 mg by mouth daily.   . psyllium (METAMUCIL SMOOTH TEXTURE) 28 % packet Take 1 packet by mouth daily before breakfast.   . sodium chloride 1 g tablet Take 4 tablets  (4 g total) by mouth daily.  [name] venlafaxine XR (EFFEXOR-XR) 75 MG 24 hr capsule TAKE 1 CAPSULE (75 MG TOTAL) BY MOUTH DAILY WITH BREAKFAST.   No facility-administered encounter medications on file as of 04/21/2020.    Allergies (verified) Augmentin [amoxicillin-pot clavulanate], Erythromycin, and Penicillins   History: Past Medical History:  Diagnosis Date  . Allergy   . Anxiety   . Arthritis    back & knee  . Asthma     mild per pt shows up with resp illness  . Colon polyps   . Constipation   . Diabetes mellitus without complication ([place])   . Dyspnea   . Gallstones   . Gastric polyps   . Gastroparesis   . GERD (gastroesophageal reflux disease)   . Headache    sinus headaches and migraines at times  . Heart murmur   . History of migraine headaches   . HTN (hypertension)   . Hyperlipidemia   . IBS (irritable bowel syndrome)   . Joint pain   . Lumbar disc disease   . S/P aortic valve replacement with bioprosthetic valve 08/23/2016   25 mm Edwards Intuity rapid-deployment bovine pericardial tissue valve via partial upper mini sternotomy  . Sleep apnea    CPAP  . TIA (transient ischemic attack)    Past Surgical History:  Procedure Laterality Date  . ABDOMINAL HYSTERECTOMY    . AORTIC VALVE REPLACEMENT N/A 08/23/2016   Procedure: AORTIC VALVE REPLACEMENT (AVR) - using partial Upper Sternotomy- 67m Edwards Intuity Aortic Valve used;  Surgeon: CRexene Alberts MD;  [facility]  Service: Open Heart Surgery;  Laterality: N/A;  . BLADDER SUSPENSION  2007  . BREAST ENHANCEMENT SURGERY  1980  . CARPAL TUNNEL RELEASE Bilateral   . COLONOSCOPY    . DORSAL COMPARTMENT RELEASE Right 09/15/2014   Procedure: RIGHT WRIST DEQUERVAINS RELEASE ;  Surgeon: DKathryne Hitch MD;  [facility]  Service: Orthopedics;  Laterality: Right;  . ESOPHAGOGASTRODUODENOSCOPY    . EYE SURGERY     cataract surgery  . KNEE ARTHROSCOPY  04/15/2012   Procedure: ARTHROSCOPY KNEE;   Surgeon: DNinetta Lights MD;  [facility]  Service: Orthopedics;  Laterality: Right;  . LAPAROSCOPIC CHOLECYSTECTOMY    . LASIK    . OOPHORECTOMY    . PALATE TO GINGIVA GRAFT  2017  . RIGHT/LEFT HEART CATH AND CORONARY ANGIOGRAPHY N/A 08/02/2016   Procedure: Right/Left Heart Cath and Coronary Angiography;  Surgeon: MSherren Mocha MD;  [facility];  Service: Cardiovascular;  Laterality: N/A;  . ROTATOR CUFF REPAIR Left   . TEE WITHOUT CARDIOVERSION N/A 08/23/2016   Procedure: TRANSESOPHAGEAL ECHOCARDIOGRAM (TEE);  Surgeon: CRexene Alberts MD;  [facility] [facility]  Service: Open Heart Surgery;  Laterality: N/A;  . TOTAL KNEE ARTHROPLASTY  04/15/2012   Procedure: TOTAL KNEE ARTHROPLASTY;  Surgeon: [name], MD;  Location: [facility];  Service: Orthopedics;  Laterality: Right;  . TRIGGER FINGER RELEASE Right 04/20/2015   Procedure: RIGHT TRIGGER FINGER RELEASE (TENDON SHEALTH INCISION) ;  Surgeon: [name], MD;  Location: [facility];  Service: Orthopedics;  Laterality: Right;   Family History  Problem Relation Age of Onset  . COPD Mother   . Colon polyps Mother   . Irritable bowel syndrome Mother   . Anxiety disorder Mother   . Heart disease Father   . Alcohol abuse Father   . Breast cancer Maternal Grandmother   . Gallbladder disease Maternal Grandmother   . Colon polyps Maternal Aunt   . Diabetes Paternal Uncle        several uncles  . Other Neg Hx        hyponatremia  . Colon cancer Neg Hx   . Esophageal cancer Neg Hx   . Rectal cancer Neg Hx   . Stomach cancer Neg Hx    Social History   Socioeconomic History  . Marital status: Married    Spouse name: [name]  . Number of children: 1  . Years of education: Not on file  . Highest education level: Not on file  Occupational History  . Occupation: Retired, Curator: RETIRED  Tobacco Use  . Smoking status: Never Smoker  . Smokeless tobacco: Never Used  Vaping  Use  . Vaping Use: Never used  Substance and Sexual Activity  . Alcohol use: No  . Drug use: No  . Sexual activity: Yes  Other Topics Concern  . Not on file  Social History Narrative   She lives with husband (1978) and two dogs. Step-son [name] (1971-psychiatrist in [place]). No grandkids. 2 dogs-sheltie/collie mix and border collie mix      Highest level of education:  Master in education   She is retired Animal nutritionist x 32 years.      Hobbies: time with dogs   Right handed   One story home         Social Determinants of Health   Financial Resource Strain: Low Risk   . Difficulty of Paying Living Expenses: Not hard at all  Food Insecurity: No Food Insecurity  . Worried About Charity fundraiser in the Last Year: Never true  . Ran Out of Food in the Last Year: Never true  Transportation Needs: No Transportation Needs  . Lack of Transportation (Medical): No  . Lack of Transportation (Non-Medical): No  Physical Activity: Insufficiently Active  . Days of Exercise per Week: 3 days  . Minutes of Exercise per Session: 20 min  Stress: No Stress Concern Present  . Feeling of Stress : Not at all  Social Connections: Moderately Isolated  . Frequency of Communication with Friends and Family: More than three times a week  . Frequency of Social Gatherings with Friends and Family: Once a week  . Attends Religious Services: Never  . Active Member of Clubs or Organizations: No  . Attends Archivist Meetings: Never  . Marital Status: Married    Tobacco Counseling Counseling given: Not Answered   Clinical Intake:  Pre-visit preparation completed: Yes  Pain : No/denies pain     BMI - recorded: 31.64 Nutritional Status: BMI > 30  Obese Nutritional Risks: None Diabetes: Yes CBG done?: Yes (124) CBG resulted in Enter/ Edit results?: No Did pt. bring in CBG monitor from home?: No  How  often do you need to have someone help you when you read instructions,  pamphlets, or other written materials from your doctor or pharmacy?: 1 - Never  Diabetic?Nutrition Risk Assessment:  Has the patient had any N/V/D within the last 2 months?  No  Does the patient have any non-healing wounds?  No  Has the patient had any unintentional weight loss or weight gain?  No   Diabetes:  Is the patient diabetic?  Yes  If diabetic, was a CBG obtained today?  Yes  Did the patient bring in their glucometer from home?  No  How often do you monitor your CBG's? Daily.   Financial Strains and Diabetes Management:  Are you having any financial strains with the device, your supplies or your medication? No .  Does the patient want to be seen by Chronic Care Management for management of their diabetes?  No  Would the patient like to be referred to a Nutritionist or for Diabetic Management?  No   Diabetic Exams:  Diabetic Eye Exam: Overdue for diabetic eye exam. Pt has been advised about the importance in completing this exam. Patient advised to call and schedule an eye exam.   Diabetic Foot Exam: Overdue, Pt has been advised about the importance in completing this exam. Pt is scheduled for diabetic foot exam on 06/09/20.   Interpreter Needed?: No  Information entered by :: Charlott Rakes ,LPN   Activities of Daily Living In your present state of health, do you have any difficulty performing the following activities: 04/21/2020  Hearing? Y  Comment mild loss in crowds  Vision? N  Difficulty concentrating or making decisions? N  Walking or climbing stairs? N  Dressing or bathing? N  Doing errands, shopping? N  Preparing Food and eating ? N  Using the Toilet? N  In the past six months, have you accidently leaked urine? N  Do you have problems with loss of bowel control? N  Managing your Medications? N  Managing your Finances? N  Housekeeping or managing your Housekeeping? N  Some recent data might be hidden    Patient Care Team: [name], MD as  PCP - General (Family Medicine) Dorothy Spark, MD as PCP - Cardiology (Cardiology) Olga Millers, MD as Consulting Physician (Obstetrics and Gynecology) Pieter Partridge, DO as Consulting Physician (Neurology) [name], PA-C as Physician Assistant (Specialist) Rutherford Guys, MD as Consulting Physician (Ophthalmology) Renato Shin, MD as Consulting Physician (Endocrinology) Harriett Sine, MD as Consulting Physician (Dermatology) Harold Hedge, Darrick Grinder, MD as Consulting Physician (Allergy and Immunology) Irene Shipper, MD as Consulting Physician (Gastroenterology) Renette Butters, MD as Attending Physician (Orthopedic Surgery) Renette Butters, MD as Attending Physician (Orthopedic Surgery)  Indicate any recent Medical Services you may have received from other than [facility] providers in the past year (date may be approximate).     Assessment:   This is a routine wellness examination for Loa.  Hearing/Vision screen  Hearing Screening   125Hz  250Hz  500Hz  1000Hz  2000Hz  3000Hz  4000Hz  6000Hz  8000Hz   Right ear:           Left ear:           Comments: Pt states mild loss at time in crowds  Vision Screening Comments: Pt follows up with Dr Gershon Crane for annual eye exams   Dietary issues and exercise activities discussed: Current Exercise Habits: Home exercise routine, Type of exercise: walking, Time (Minutes): 25, Frequency (Times/Week): 3, Weekly Exercise (Minutes/Week): 75  Goals    .  exercise     Hope to get back to the gym when able  [place] gym routine  Seeing Dr. Leafy Ro     . Patient Stated     Lose weight     . Weight (lb) < 180 lb (81.6 kg)     Continue to work with weight loss center to obtain weight goals       Depression Screen PHQ 2/9 Scores 04/21/2020 12/09/2019 06/08/2019 05/28/2019 05/13/2019 04/27/2019 03/09/2019  PHQ - 2 Score 0 0 0 0 0 0 0  PHQ- 9 Score - 0 - 0 1 0 -    Fall Risk Fall Risk  04/21/2020 07/20/2019 06/08/2019 03/09/2019 07/16/2018    Falls in the past year? 0 0 0 0 0  Comment - - - - -  Number falls in past yr: 0 0 0 0 -  Injury with Fall? 0 0 0 0 -  Risk for fall due to : Impaired vision - - - -  Risk for fall due to: Comment - - - - -  Follow up Falls prevention discussed - - Falls evaluation completed;Education provided Falls evaluation completed    Any stairs in or around the home? Yes  If so, are there any without handrails? No  Home free of loose throw rugs in walkways, pet beds, electrical cords, etc? Yes  Adequate lighting in your home to reduce risk of falls? Yes   ASSISTIVE DEVICES UTILIZED TO PREVENT FALLS:  Life alert? No  Use of a cane, walker or w/c? No  Grab bars in the bathroom? No  Shower chair or bench in shower? No  Elevated toilet seat or a handicapped toilet? No   TIMED UP AND GO:  Was the test performed? No .      Cognitive Function: MMSE - Mini Mental State Exam 01/16/2018  Not completed: (No Data)     6CIT Screen 04/21/2020  What Year? 0 points  What month? 0 points  Count back from 20 0 points  Months in reverse 0 points  Repeat phrase 0 points    Immunizations Immunization History  Administered Date(s) Administered  . Influenza Split 04/04/2011, 03/05/2012  . Influenza Whole 04/16/2007, 03/16/2009, 03/13/2010  . Influenza, High Dose Seasonal PF 01/31/2019, 02/27/2020, 02/28/2020  . Influenza,inj,Quad PF,6+ Mos 03/30/2013, 03/10/2014, 03/23/2015, 02/27/2016  . Influenza-Unspecified 02/27/2017, 02/28/2017, 01/31/2018  . PFIZER SARS-COV-2 Vaccination 08/15/2019, 09/08/2019, 03/31/2020  . Pneumococcal Conjugate-13 05/21/2016  . Pneumococcal Polysaccharide-23 05/23/2017  . Td 06/24/1996, 08/06/2007, 11/07/2017  . Tdap 11/07/2017  . Zoster 05/12/2014  . Zoster Recombinat (Shingrix) 01/21/2019, 03/24/2019    TDAP status: Up to date Flu Vaccine status: Up to date Pneumococcal vaccine status: Up to date Covid-19 vaccine status: Completed vaccines  Qualifies for  Shingles Vaccine? Yes   Zostavax completed Yes   Shingrix Completed?: Yes  Screening Tests Health Maintenance  Topic Date Due  . FOOT EXAM  06/09/2020 (Originally 01/05/2020)  . HEMOGLOBIN A1C  10/19/2020  . OPHTHALMOLOGY EXAM  04/20/2021  . MAMMOGRAM  07/05/2021  . COLONOSCOPY  07/28/2024  . TETANUS/TDAP  11/08/2027  . INFLUENZA VACCINE  Completed  . DEXA SCAN  Completed  . COVID-19 Vaccine  Completed  . Hepatitis C Screening  Completed  . PNA vac Low Risk Adult  Completed    Health Maintenance  There are no preventive care reminders to display for this patient.  Colorectal cancer screening: Completed 07/28/14. Repeat every 10 years Mammogram status: Completed 07/06/19. Repeat every year Bone Density status: Completed  05/26/18. Results reflect: Bone density results: OSTEOPENIA. Repeat every 2 years.    Additional Screening:  Hepatitis C Screening: Completed 04/1715  Vision Screening: Recommended annual ophthalmology exams for early detection of glaucoma and other disorders of the eye. Is the patient up to date with their annual eye exam?  Yes  Who is the provider or what is the name of the office in which the patient attends annual eye exams? Dr Gershon Crane   Dental Screening: Recommended annual dental exams for proper oral hygiene  Community Resource Referral / Chronic Care Management: CRR required this visit?  No   CCM required this visit?  No      Plan:     I have personally reviewed and noted the following in the patient's chart:   . Medical and social history . Use of alcohol, tobacco or illicit drugs  . Current medications and supplements . Functional ability and status . Nutritional status . Physical activity . Advanced directives . List of other physicians . Hospitalizations, surgeries, and ER visits in previous 12 months . Vitals . Screenings to include cognitive, depression, and falls . Referrals and appointments  In addition, I have reviewed and  discussed with patient certain preventive protocols, quality metrics, and best practice recommendations. A written personalized care plan for preventive services as well as general preventive health recommendations were provided to patient.     Hayley Brace, LPN   25/75/0518   Nurse Notes: None

## 2020-04-21 NOTE — Patient Instructions (Addendum)
We'll continue to follow the alkaline phosphatase. Blood tests are requested for you today.  We'll let you know about the results.  If this is low, we'll increase the salt pills.  If this is high, we'll add furosemide.

## 2020-04-21 NOTE — Patient Instructions (Addendum)
[name] , Thank you for taking time to come for your Medicare Wellness Visit. I appreciate your ongoing commitment to your health goals. Please review the following plan we discussed and let me know if I can assist you in the future.   Screening recommendations/referrals: Colonoscopy: Done 07/28/14 Mammogram: Done 07/06/19 Bone Density: Done 05/26/18 Recommended yearly ophthalmology/optometry visit for glaucoma screening and checkup Recommended yearly dental visit for hygiene and checkup  Vaccinations: Influenza vaccine: Up to date Done 02/28/20 Pneumococcal vaccine: Up to date Tdap vaccine: Up to date Shingles vaccine: Completed 7/30 & 03/24/19   Covid-19:Completed 2/21, 3/17, 03/31/20  Advanced directives: Please bring a copy of your health care power of attorney and living will to the office at your convenience.  Conditions/risks identified: Lose weight  Next appointment: Follow up in one year for your annual wellness visit    Preventive Care 65 Years and Older, Female Preventive care refers to lifestyle choices and visits with your health care provider that can promote health and wellness. What does preventive care include?  A yearly physical exam. This is also called an annual well check.  Dental exams once or twice a year.  Routine eye exams. Ask your health care provider how often you should have your eyes checked.  Personal lifestyle choices, including:  Daily care of your teeth and gums.  Regular physical activity.  Eating a healthy diet.  Avoiding tobacco and drug use.  Limiting alcohol use.  Practicing safe sex.  Taking low-dose aspirin every day.  Taking vitamin and mineral supplements as recommended by your health care provider. What happens during an annual well check? The services and screenings done by your health care provider during your annual well check will depend on your age, overall health, lifestyle risk factors, and family history of disease.  Counseling  Your health care provider may ask you questions about your:  Alcohol use.  Tobacco use.  Drug use.  Emotional well-being.  Home and relationship well-being.  Sexual activity.  Eating habits.  History of falls.  Memory and ability to understand (cognition).  Work and work Statistician.  Reproductive health. Screening  You may have the following tests or measurements:  Height, weight, and BMI.  Blood pressure.  Lipid and cholesterol levels. These may be checked every 5 years, or more frequently if you are over 32 years old.  Skin check.  Lung cancer screening. You may have this screening every year starting at age 20 if you have a 30-pack-year history of smoking and currently smoke or have quit within the past 15 years.  Fecal occult blood test (FOBT) of the stool. You may have this test every year starting at age 44.  Flexible sigmoidoscopy or colonoscopy. You may have a sigmoidoscopy every 5 years or a colonoscopy every 10 years starting at age 94.  Hepatitis C blood test.  Hepatitis B blood test.  Sexually transmitted disease (STD) testing.  Diabetes screening. This is done by checking your blood sugar (glucose) after you have not eaten for a while (fasting). You may have this done every 1-3 years.  Bone density scan. This is done to screen for osteoporosis. You may have this done starting at age 41.  Mammogram. This may be done every 1-2 years. Talk to your health care provider about how often you should have regular mammograms. Talk with your health care provider about your test results, treatment options, and if necessary, the need for more tests. Vaccines  Your health care provider may  recommend certain vaccines, such as:  Influenza vaccine. This is recommended every year.  Tetanus, diphtheria, and acellular pertussis (Tdap, Td) vaccine. You may need a Td booster every 10 years.  Zoster vaccine. You may need this after age 49.   Pneumococcal 13-valent conjugate (PCV13) vaccine. One dose is recommended after age 42.  Pneumococcal polysaccharide (PPSV23) vaccine. One dose is recommended after age 66. Talk to your health care provider about which screenings and vaccines you need and how often you need them. This information is not intended to replace advice given to you by your health care provider. Make sure you discuss any questions you have with your health care provider. Document Released: 07/07/2015 Document Revised: 02/28/2016 Document Reviewed: 04/11/2015 Elsevier Interactive Patient Education  2017 [place] Prevention in the Home Falls can cause injuries. They can happen to people of all ages. There are many things you can do to make your home safe and to help prevent falls. What can I do on the outside of my home?  Regularly fix the edges of walkways and driveways and fix any cracks.  Remove anything that might make you trip as you walk through a door, such as a raised step or threshold.  Trim any bushes or trees on the path to your home.  Use bright outdoor lighting.  Clear any walking paths of anything that might make someone trip, such as rocks or tools.  Regularly check to see if handrails are loose or broken. Make sure that both sides of any steps have handrails.  Any raised decks and porches should have guardrails on the edges.  Have any leaves, snow, or ice cleared regularly.  Use sand or salt on walking paths during winter.  Clean up any spills in your garage right away. This includes oil or grease spills. What can I do in the bathroom?  Use night lights.  Install grab bars by the toilet and in the tub and shower. Do not use towel bars as grab bars.  Use non-skid mats or decals in the tub or shower.  If you need to sit down in the shower, use a plastic, non-slip stool.  Keep the floor dry. Clean up any water that spills on the floor as soon as it happens.  Remove soap  buildup in the tub or shower regularly.  Attach bath mats securely with double-sided non-slip rug tape.  Do not have throw rugs and other things on the floor that can make you trip. What can I do in the bedroom?  Use night lights.  Make sure that you have a light by your bed that is easy to reach.  Do not use any sheets or blankets that are too big for your bed. They should not hang down onto the floor.  Have a firm chair that has side arms. You can use this for support while you get dressed.  Do not have throw rugs and other things on the floor that can make you trip. What can I do in the kitchen?  Clean up any spills right away.  Avoid walking on wet floors.  Keep items that you use a lot in easy-to-reach places.  If you need to reach something above you, use a strong step stool that has a grab bar.  Keep electrical cords out of the way.  Do not use floor polish or wax that makes floors slippery. If you must use wax, use non-skid floor wax.  Do not have throw rugs and  other things on the floor that can make you trip. What can I do with my stairs?  Do not leave any items on the stairs.  Make sure that there are handrails on both sides of the stairs and use them. Fix handrails that are broken or loose. Make sure that handrails are as long as the stairways.  Check any carpeting to make sure that it is firmly attached to the stairs. Fix any carpet that is loose or worn.  Avoid having throw rugs at the top or bottom of the stairs. If you do have throw rugs, attach them to the floor with carpet tape.  Make sure that you have a light switch at the top of the stairs and the bottom of the stairs. If you do not have them, ask someone to add them for you. What else can I do to help prevent falls?  Wear shoes that:  Do not have high heels.  Have rubber bottoms.  Are comfortable and fit you well.  Are closed at the toe. Do not wear sandals.  If you use a stepladder:  Make  sure that it is fully opened. Do not climb a closed stepladder.  Make sure that both sides of the stepladder are locked into place.  Ask someone to hold it for you, if possible.  Clearly mark and make sure that you can see:  Any grab bars or handrails.  First and last steps.  Where the edge of each step is.  Use tools that help you move around (mobility aids) if they are needed. These include:  Canes.  Walkers.  Scooters.  Crutches.  Turn on the lights when you go into a dark area. Replace any light bulbs as soon as they burn out.  Set up your furniture so you have a clear path. Avoid moving your furniture around.  If any of your floors are uneven, fix them.  If there are any pets around you, be aware of where they are.  Review your medicines with your doctor. Some medicines can make you feel dizzy. This can increase your chance of falling. Ask your doctor what other things that you can do to help prevent falls. This information is not intended to replace advice given to you by your health care provider. Make sure you discuss any questions you have with your health care provider. Document Released: 04/06/2009 Document Revised: 11/16/2015 Document Reviewed: 07/15/2014 Elsevier Interactive Patient Education  2017 [employer].

## 2020-04-25 DIAGNOSIS — Z01419 Encounter for gynecological examination (general) (routine) without abnormal findings: Secondary | ICD-10-CM | POA: Diagnosis not present

## 2020-05-04 ENCOUNTER — Ambulatory Visit (INDEPENDENT_AMBULATORY_CARE_PROVIDER_SITE_OTHER): Payer: Medicare PPO | Admitting: Physician Assistant

## 2020-05-16 ENCOUNTER — Ambulatory Visit (INDEPENDENT_AMBULATORY_CARE_PROVIDER_SITE_OTHER): Payer: Medicare PPO | Admitting: Physician Assistant

## 2020-05-16 ENCOUNTER — Encounter (INDEPENDENT_AMBULATORY_CARE_PROVIDER_SITE_OTHER): Payer: Self-pay | Admitting: Physician Assistant

## 2020-05-16 ENCOUNTER — Other Ambulatory Visit: Payer: Self-pay

## 2020-05-16 VITALS — BP 138/67 | HR 60 | Temp 98.4°F | Ht 66.0 in | Wt 197.0 lb

## 2020-05-16 DIAGNOSIS — E119 Type 2 diabetes mellitus without complications: Secondary | ICD-10-CM

## 2020-05-16 DIAGNOSIS — E669 Obesity, unspecified: Secondary | ICD-10-CM

## 2020-05-16 DIAGNOSIS — I10 Essential (primary) hypertension: Secondary | ICD-10-CM

## 2020-05-16 DIAGNOSIS — Z6831 Body mass index (BMI) 31.0-31.9, adult: Secondary | ICD-10-CM | POA: Diagnosis not present

## 2020-05-16 DIAGNOSIS — E1169 Type 2 diabetes mellitus with other specified complication: Secondary | ICD-10-CM

## 2020-05-17 NOTE — Progress Notes (Signed)
Chief Complaint:   OBESITY [name] is here to discuss her progress with her obesity treatment plan along with follow-up of her obesity related diagnoses. [name] is on the Category 4 Plan and states she is following her eating plan approximately 90% of the time. [name] states she is walking 15-20 minutes 5 times per week.  Today's visit was #: 5 Starting weight: 220 lbs Starting date: 01/19/2018 Today's weight: 197 lbs Today's date: 05/16/2020 Total lbs lost to date: 23 Total lbs lost since last in-office visit: 0  Interim History: [name] reports that her dog was just diagnosed with lymphoma and she did some comfort eating. She thinks that she has not been eating all of the food on the plan. She has not been journaling.   Subjective:   Diabetes mellitus with coincident hypertension ([name]). Fasting blood sugars 120-144. [name] is on metformin and denies nausea, vomiting, or diarrhea. Last A1c 5.7.   Lab Results  Component Value Date   HGBA1C 5.7 (H) 04/20/2020   HGBA1C 5.9 (A) 12/09/2019   HGBA1C 6.1 06/08/2019   Lab Results  Component Value Date   MICROALBUR <0.7 01/05/2019   LDLCALC 63 04/20/2020   CREATININE 0.54 (L) 04/20/2020   Lab Results  Component Value Date   INSULIN 13.1 04/20/2020   INSULIN 12.1 01/19/2018   Essential hypertension. Blood pressure is controlled. [name] is on Cozaar. No  chest pain or headache.   BP Readings from Last 3 Encounters:  05/16/20 138/67  04/21/20 132/68  04/20/20 134/66   Lab Results  Component Value Date   CREATININE 0.54 (L) 04/20/2020   CREATININE 0.62 12/28/2019   CREATININE 0.46 11/18/2019   Assessment/Plan:   Diabetes mellitus with coincident hypertension ([place]). Good blood sugar control is important to decrease the likelihood of diabetic complications such as nephropathy, neuropathy, limb loss, blindness, coronary artery disease, and death. Intensive lifestyle modification including  diet, exercise and weight loss are the first line of treatment for diabetes. [name] will continue metformin as directed. A1c will be checked every 3 months.   Essential hypertension. [name] is working on healthy weight loss and exercise to improve blood pressure control. We will watch for signs of hypotension as she continues her lifestyle modifications. She will continue her medication as directed. Blood pressure will be monitored at each visit.  Class 1 obesity with serious comorbidity and body mass index (BMI) of 31.0 to 31.9 in adult, unspecified obesity type.  [name] is currently in the action stage of change. As such, her goal is to continue with weight loss efforts. She has agreed to keeping a food journal and adhering to recommended goals of 1600-1800 calories and 115 grams of protein daily.   Exercise goals: Older adults should follow the adult guidelines. When older adults cannot meet the adult guidelines, they should be as physically active as their abilities and conditions will allow.   Behavioral modification strategies: meal planning and cooking strategies and keeping healthy foods in the home.  [name] has agreed to follow-up with our clinic in 2-3 weeks. She was informed of the importance of frequent follow-up visits to maximize her success with intensive lifestyle modifications for her multiple health conditions.   Objective:   Blood pressure 138/67, pulse 60, temperature 98.4 F (36.9 C), height 5\' 6"  (1.676 m), weight 197 lb (89.4 kg), SpO2 98 %. Body mass index is 31.8 kg/m.  General: Cooperative, alert, well developed, in no acute distress. HEENT: Conjunctivae and lids unremarkable. Cardiovascular: Regular  rhythm.  Lungs: Normal work of breathing. Neurologic: No focal deficits.   Lab Results  Component Value Date   CREATININE 0.54 (L) 04/20/2020   BUN 19 04/20/2020   NA 133 (L) 04/20/2020   K 4.7 04/20/2020   CL 95 (L) 04/20/2020   CO2 23  04/20/2020   Lab Results  Component Value Date   ALT 18 04/20/2020   AST 32 04/20/2020   ALKPHOS 171 (H) 04/20/2020   BILITOT 0.5 04/20/2020   Lab Results  Component Value Date   HGBA1C 5.7 (H) 04/20/2020   HGBA1C 5.9 (A) 12/09/2019   HGBA1C 6.1 06/08/2019   HGBA1C 5.7 (A) 05/24/2019   HGBA1C 5.7 (A) 01/05/2019   Lab Results  Component Value Date   INSULIN 13.1 04/20/2020   INSULIN 12.1 01/19/2018   Lab Results  Component Value Date   TSH 3.38 06/04/2018   Lab Results  Component Value Date   CHOL 141 04/20/2020   HDL 65 04/20/2020   LDLCALC 63 04/20/2020   LDLDIRECT 58.0 11/25/2017   TRIG 66 04/20/2020   CHOLHDL 2.2 04/20/2020   Lab Results  Component Value Date   WBC 9.8 11/18/2019   HGB 14.0 11/18/2019   HCT 41.2 11/18/2019   MCV 87.8 11/18/2019   PLT 281 11/18/2019   No results found for: IRON, TIBC, FERRITIN  Obesity Behavioral Intervention:   Approximately 15 minutes were spent on the discussion below.  ASK: We discussed the diagnosis of obesity with [name] today and [name] agreed to give [name] permission to discuss obesity behavioral modification therapy today.  ASSESS: [name] has the diagnosis of obesity and her BMI today is 31.9. [name] is in the action stage of change.   ADVISE: [name] was educated on the multiple health risks of obesity as well as the benefit of weight loss to improve her health. She was advised of the need for long term treatment and the importance of lifestyle modifications to improve her current health and to decrease her risk of future health problems.  AGREE: Multiple dietary modification options and treatment options were discussed and [name] agreed to follow the recommendations documented in the above note.  ARRANGE: [name] was educated on the importance of frequent visits to treat obesity as outlined per CMS and USPSTF guidelines and agreed to schedule her next follow up appointment  today.  Attestation Statements:   Reviewed by clinician on day of visit: allergies, medications, problem list, medical history, surgical history, family history, social history, and previous encounter notes.  IMichaelene Song, am acting as transcriptionist for [name], PA-C   I have reviewed the above documentation for accuracy and completeness, and I agree with the above. [name], PA-C

## 2020-05-23 ENCOUNTER — Encounter: Payer: Self-pay | Admitting: Cardiology

## 2020-05-23 ENCOUNTER — Ambulatory Visit: Payer: Medicare PPO | Admitting: Cardiology

## 2020-05-23 ENCOUNTER — Other Ambulatory Visit: Payer: Self-pay | Admitting: Family Medicine

## 2020-05-23 ENCOUNTER — Other Ambulatory Visit: Payer: Self-pay

## 2020-05-23 VITALS — BP 126/72 | HR 70 | Ht 66.0 in | Wt 206.0 lb

## 2020-05-23 DIAGNOSIS — E782 Mixed hyperlipidemia: Secondary | ICD-10-CM

## 2020-05-23 DIAGNOSIS — I5032 Chronic diastolic (congestive) heart failure: Secondary | ICD-10-CM

## 2020-05-23 DIAGNOSIS — Z953 Presence of xenogenic heart valve: Secondary | ICD-10-CM | POA: Diagnosis not present

## 2020-05-23 DIAGNOSIS — I10 Essential (primary) hypertension: Secondary | ICD-10-CM

## 2020-05-23 DIAGNOSIS — I351 Nonrheumatic aortic (valve) insufficiency: Secondary | ICD-10-CM

## 2020-05-23 MED ORDER — CLONIDINE HCL 0.1 MG PO TABS: 0.1000 mg | ORAL_TABLET | Freq: Every day | ORAL | 2 refills | Status: DC

## 2020-05-23 NOTE — Patient Instructions (Addendum)
Medication Instructions:   STOP TAKING LOSARTAN (COZAAR) NOW  START TAKING CLONIDINE 0.1 MG BY MOUTH DAILY AT BEDTIME  *If you need a refill on your cardiac medications before your next appointment, please call your pharmacy*   Testing/Procedures:  Your physician has requested that you have an echocardiogram. Echocardiography is a painless test that uses sound waves to create images of your heart. It provides your doctor with information about the size and shape of your heart and how well your heart's chambers and valves are working. This procedure takes approximately one hour. There are no restrictions for this procedure.  PLEASE SCHEDULE ECHO A WEEK PRIOR TO HER NEXT FOLLOW-UP APPOINTMENT WITH DR. Meda Coffee IN 3-4 MONTHS   Follow-Up:  3-4 MONTHS WITH DR. Meda Coffee IN THE OFFICE WITH AN ECHO A WEEK PRIOR TO THAT VISIT--SCHEDULING YOU MAY ADD PATIENT TO DR. Francesca Oman HELD SCHEDULE FOR FOLLOW-UP ON 08/30/20

## 2020-05-23 NOTE — Progress Notes (Signed)
Cardiology Office Note:    Date:  05/24/2020   ID:  KELLYANN ORDWAY, DOB 21-Sep-1950, MRN [id number]  PCP:  [name], MD  Cardiologist:  Dr Meda Coffee  Referring MD: [name], MD   Reason for visit: 6 months follow-up  History of Present Illness:    KHALEAH DUER is a 69 y.o. female with a hx of Diabetes, hyperlipidemia, OSA on CPAP, and aortic valve replacement with bioprosthetic bovine pericardial tissue valve 08/23/16. Patient had only minor nonobstructive CAD on heart catheterization 08/02/16 done in preparation for AVR. Also right heart cath showed normal right heart hemodynamics. Echocardiogram showed normal EF of 60-65% and grade 1 diastolic dysfunction.  The patient was very motivated post surgery, underwent complete rehab program and dietary program and has lost 30 pounds.   The patient continues to follow with healthy weight and wellness clinic, she eats healthy, takes Metformin for diabetes, she tolerates atorvastatin well, her blood pressure has been well controlled.  Denies any lower extremity edema orthopnea proximal nocturnal dyspnea no dyspnea on exertion and no chest pain.  Past Medical History:  Diagnosis Date  . Allergy   . Anxiety   . Arthritis    back & knee  . Asthma     mild per pt shows up with resp illness  . Colon polyps   . Constipation   . Diabetes mellitus without complication ([place])   . Dyspnea   . Gallstones   . Gastric polyps   . Gastroparesis   . GERD (gastroesophageal reflux disease)   . Headache    sinus headaches and migraines at times  . Heart murmur   . History of migraine headaches   . HTN (hypertension)   . Hyperlipidemia   . IBS (irritable bowel syndrome)   . Joint pain   . Lumbar disc disease   . S/P aortic valve replacement with bioprosthetic valve 08/23/2016   25 mm Edwards Intuity rapid-deployment bovine pericardial tissue valve via partial upper mini sternotomy  . Sleep apnea    CPAP  . TIA (transient  ischemic attack)     Past Surgical History:  Procedure Laterality Date  . ABDOMINAL HYSTERECTOMY    . AORTIC VALVE REPLACEMENT N/A 08/23/2016   Procedure: AORTIC VALVE REPLACEMENT (AVR) - using partial Upper Sternotomy- 37m Edwards Intuity Aortic Valve used;  Surgeon: CRexene Alberts MD;  [place] [facility]  Service: Open Heart Surgery;  Laterality: N/A;  . BLADDER SUSPENSION  2007  . BREAST ENHANCEMENT SURGERY  1980  . CARPAL TUNNEL RELEASE Bilateral   . COLONOSCOPY    . DORSAL COMPARTMENT RELEASE Right 09/15/2014   Procedure: RIGHT WRIST DEQUERVAINS RELEASE ;  Surgeon: DKathryne Hitch MD;  [place] [facility]  Service: Orthopedics;  Laterality: Right;  . ESOPHAGOGASTRODUODENOSCOPY    . EYE SURGERY     cataract surgery  . KNEE ARTHROSCOPY  04/15/2012   Procedure: ARTHROSCOPY KNEE;  Surgeon: DNinetta Lights MD;  [place] [facility]  Service: Orthopedics;  Laterality: Right;  . LAPAROSCOPIC CHOLECYSTECTOMY    . LASIK    . OOPHORECTOMY    . PALATE TO GINGIVA GRAFT  2017  . RIGHT/LEFT HEART CATH AND CORONARY ANGIOGRAPHY N/A 08/02/2016   Procedure: Right/Left Heart Cath and Coronary Angiography;  Surgeon: MSherren Mocha MD;  [facility];  Service: Cardiovascular;  Laterality: N/A;  . ROTATOR CUFF REPAIR Left   . TEE WITHOUT CARDIOVERSION N/A 08/23/2016   Procedure: TRANSESOPHAGEAL ECHOCARDIOGRAM (TEE);  Surgeon: Rexene Alberts, MD;  Location: [facility];  Service: Open Heart Surgery;  Laterality: N/A;  . TOTAL KNEE ARTHROPLASTY  04/15/2012   Procedure: TOTAL KNEE ARTHROPLASTY;  Surgeon: [name], MD;  Location: [facility];  Service: Orthopedics;  Laterality: Right;  . TRIGGER FINGER RELEASE Right 04/20/2015   Procedure: RIGHT TRIGGER FINGER RELEASE (TENDON SHEALTH INCISION) ;  Surgeon: [name], MD;  Location: [facility];  Service: Orthopedics;  Laterality: Right;    Current Medications: Current Meds  Medication Sig  . atorvastatin  (LIPITOR) 40 MG tablet TAKE 1 TABLET (40 MG TOTAL) BY MOUTH DAILY AT 6 PM.  . Biotin 5000 MCG TABS Take 5,000 mcg by mouth daily.   . blood glucose meter kit and supplies KIT Dispense based on patient and insurance preference. Use up to four times daily as directed. Dx: E11.9  . calcium-vitamin D (OSCAL WITH D) 500-200 MG-UNIT per tablet Take 2 tablets by mouth daily.   . Cholecalciferol (VITAMIN D) 125 MCG (5000 UT) CAPS Take 5,000 Units by mouth daily.  . clopidogrel (PLAVIX) 75 MG tablet Take 1 tablet (75 mg total) by mouth daily.  [name] demeclocycline (DECLOMYCIN) 300 MG tablet Take 1 tablet (300 mg total) by mouth 2 (two) times daily.  . fluticasone (FLONASE) 50 MCG/ACT nasal spray Place 2 sprays into both nostrils daily.   [name] glucose blood test strip Use to test blood sugars daily. Dx: E11.9  . ipratropium (ATROVENT) 0.03 % nasal spray 2 SPRAYS IN EACH NOSTRIL AS NEEDED THREE TIMES AS NEEDED  . loratadine (CLARITIN) 10 MG tablet Take 10 mg by mouth at bedtime.   . Melatonin 5 MG TABS Take by mouth.   . metFORMIN (GLUCOPHAGE) 500 MG tablet TAKE 1 TABLET BY MOUTH WITH BREAKFAST AND 1/2-1 WITH DINNER  . metoCLOPramide (REGLAN) 10 MG tablet TAKE 1/2 TABLET BY MOUTH 4 TIMES A DAY  . Multiple Vitamin (MULTIVITAMIN WITH MINERALS) TABS tablet Take 1 tablet by mouth daily.  . naproxen sodium (ALEVE) 220 MG tablet Take 220 mg by mouth.  . OMEPRAZOLE MAGNESIUM PO Take 40 mg by mouth daily with breakfast.   . ondansetron (ZOFRAN ODT) 4 MG disintegrating tablet Take 1 tablet (4 mg total) by mouth every 8 (eight) hours as needed.  . Probiotic Product (ALIGN) 4 MG CAPS Take 4 mg by mouth daily.   . psyllium (METAMUCIL SMOOTH TEXTURE) 28 % packet Take 1 packet by mouth daily before breakfast.   . sodium chloride 1 g tablet Take 6 g by mouth daily.  . [DISCONTINUED] losartan (COZAAR) 25 MG tablet Take 1 tablet (25 mg total) by mouth daily.  . [DISCONTINUED] venlafaxine XR (EFFEXOR-XR) 75 MG 24 hr capsule TAKE  1 CAPSULE (75 MG TOTAL) BY MOUTH DAILY WITH BREAKFAST.     Allergies:   Augmentin [amoxicillin-pot clavulanate], Erythromycin, and Penicillins   Social History   Socioeconomic History  . Marital status: Married    Spouse name: [name]  . Number of children: 1  . Years of education: Not on file  . Highest education level: Not on file  Occupational History  . Occupation: Retired, Curator: RETIRED  Tobacco Use  . Smoking status: Never Smoker  . Smokeless tobacco: Never Used  Vaping Use  . Vaping Use: Never used  Substance and Sexual Activity  . Alcohol use: No  . Drug use: No  . Sexual activity: Yes  Other Topics Concern  . Not on  file  Social History Narrative   She lives with husband (1978) and two dogs. Step-son [name] (1971-psychiatrist in [place]). No grandkids. 2 dogs-sheltie/collie mix and border collie mix      Highest level of education:  Master in education   She is retired Animal nutritionist x 32 years.      Hobbies: time with dogs   Right handed   One story home         Social Determinants of Health   Financial Resource Strain: Low Risk   . Difficulty of Paying Living Expenses: Not hard at all  Food Insecurity: No Food Insecurity  . Worried About Charity fundraiser in the Last Year: Never true  . Ran Out of Food in the Last Year: Never true  Transportation Needs: No Transportation Needs  . Lack of Transportation (Medical): No  . Lack of Transportation (Non-Medical): No  Physical Activity: Insufficiently Active  . Days of Exercise per Week: 3 days  . Minutes of Exercise per Session: 20 min  Stress: No Stress Concern Present  . Feeling of Stress : Not at all  Social Connections: Moderately Isolated  . Frequency of Communication with Friends and Family: More than three times a week  . Frequency of Social Gatherings with Friends and Family: Once a week  . Attends Religious Services: Never  . Active Member of Clubs or  Organizations: No  . Attends Archivist Meetings: Never  . Marital Status: Married     Family History: The patient's family history includes Alcohol abuse in her father; Anxiety disorder in her mother; Breast cancer in her maternal grandmother; COPD in her mother; Colon polyps in her maternal aunt and mother; Diabetes in her paternal uncle; Gallbladder disease in her maternal grandmother; Heart disease in her father; Irritable bowel syndrome in her mother. There is no history of Other, Colon cancer, Esophageal cancer, Rectal cancer, or Stomach cancer. ROS:   Please see the history of present illness.     All other systems reviewed and are negative.  EKGs/Labs/Other Studies Reviewed:    The following studies were reviewed today:  Right/Left Heart Cath and Coronary Angiography 08/02/16   1. Minor nonobstructive CAD 2. Normal right heart hemodynamics (including normal PCWP) 3. Known severe aortic stenosis    Intraoperative TEE 08/23/16  Left ventricle: Normal cavity size. Concentric hypertrophy of moderate severity. LV systolic function is normal with an EF of 60-65%. There are no obvious wall motion abnormalities.  Septum: No Patent Foramen Ovale present.  Left atrium: Patent foramen ovale not present.  Aortic valve: The valve is bicuspid. Severe valve thickening present. Severe valve calcification present. Moderately decreased leaflet separation. Severe stenosis. No regurgitation. No AV vegetation. No evidence of papillary fibroelastoma.  Aorta: The ascending aorta is mildly dilated.  Mitral valve: No leaflet thickening and calcification present. Mild mitral annular calcification.  Right ventricle: Normal cavity size, wall thickness and ejection fraction. Tricuspid valve: Trace regurgitation. The tricuspid valve regurgitation jet is central.   TTE: 08/26/2017   Left ventricle: The cavity size was mildly dilated. Wall   thickness was increased in a pattern of mild LVH.  Systolic   function was normal. The estimated ejection fraction was in the   range of 60% to 65%. Left ventricular diastolic function   parameters were normal. - Aortic valve: patient had a Edwards 25 mm Intuity Elite rapid   deployment bovine pericardial valve placed 09/12/16. 05/01/17 mean   gradient was 6 mmHg peak gradient  12 mmHg. Gradients have   increased now 17 mmHg and 34 mmHg peak. There is a mild leak seen   through the basal stented portion of the valve appears at around   10:00 on BSA views - Mitral valve: There was mild regurgitation. - Atrial septum: No defect or patent foramen ovale was identified.  TTE12/21/2020   1. Left ventricular ejection fraction, by visual estimation, is 60 to  65%. The left ventricle has normal function. There is no left ventricular  hypertrophy.  2. Abnormal septal motion consistent with post-operative status.  3. The left ventricle has no regional wall motion abnormalities.  4. Global right ventricle has normal systolic function.The right  ventricular size is normal. No increase in right ventricular wall  thickness.  5. Left atrial size was mildly dilated.  6. Right atrial size was normal.  7. Bioprosthetic aortic valve with previously described mild perivalvular  leak. Normal prosthetic gradients.  8. The mitral valve is normal in structure. Trivial mitral valve  regurgitation. No evidence of mitral stenosis.  9. The tricuspid valve is normal in structure. Tricuspid valve  regurgitation is not demonstrated.  10. The pulmonic valve was normal in structure. Pulmonic valve  regurgitation is not visualized.  11. The inferior vena cava is normal in size with greater than 50%  respiratory variability, suggesting right atrial pressure of 3 mmHg.  12. Aortic prosthesis gradients are lower, otherwise no change since  08/26/2017.  13. Prior images reviewed side by side.   EKG:  EKG is ordered today and personally reviewed, which shows  sinus bradycardia 58 bpm, LVH unchanged from prior.    Recent Labs: 11/18/2019: Hemoglobin 14.0; Platelets 281 04/20/2020: ALT 18; BUN 19; Creatinine, Ser 0.54; Potassium 4.7; Sodium 133 04/21/2020: Pro B Natriuretic peptide (BNP) 42.0   Recent Lipid Panel    Component Value Date/Time   CHOL 141 04/20/2020 1036   TRIG 66 04/20/2020 1036   HDL 65 04/20/2020 1036   CHOLHDL 2.2 04/20/2020 1036   CHOLHDL 2 06/08/2019 0949   VLDL 13.0 06/08/2019 0949   LDLCALC 63 04/20/2020 1036   LDLDIRECT 58.0 11/25/2017 1156   Physical Exam:    VS:  BP 126/72   Pulse 70   Ht 5' 6"  (1.676 m)   Wt 206 lb (93.4 kg)   LMP  (LMP Unknown)   SpO2 99%   BMI 33.25 kg/m     Wt Readings from Last 3 Encounters:  05/23/20 206 lb (93.4 kg)  05/16/20 197 lb (89.4 kg)  04/21/20 206 lb (93.4 kg)    GEN: Well nourished, well developed in no acute distress HEENT: Normal NECK: No JVD; No carotid bruits LYMPHATICS: No lymphadenopathy CARDIAC: RRR, 3 out of 6 systolic and 2 out of 6 diastolic murmur, rubs, gallops RESPIRATORY:  Clear to auscultation without rales, wheezing or rhonchi  ABDOMEN: Soft, non-tender, non-distended MUSCULOSKELETAL:  No edema; No deformity  SKIN: Warm and dry NEUROLOGIC:  Alert and oriented x 3 PSYCHIATRIC:  Normal affect    ASSESSMENT:    1. S/P aortic valve replacement with bioprosthetic valve   2. Aortic valve insufficiency, etiology of cardiac valve disease unspecified   3. Mixed hyperlipidemia   4. Essential hypertension   5. Chronic diastolic congestive heart failure (HCC)      PLAN:    In order of problems listed above:  S/P AVR with bovine tissue valve 08/23/16 -The patient continues to exercise and has lost 30 pounds since surgery.   - Her most  recent echocardiogram in December 2020 showed normal transaortic gradients with mean 8 mmHg, mild paravalvular leak, she has 3 out of 6 diastolic murmur, I will repeat echocardiogram now.   Chronic diastolic CHF -She is  euvolemic, doing great with exercise and diet regimen.  Hyperlipidemia -All lipids at goal on atorvastatin.  Most recent HDL 65, LDL 63 and triglycerides 66.  Hemoglobin A1c 5.7%.  OSA -Using CPAP consistently every night.   Hypertension -Well-controlled, she needs to be started on clonidine for another medical condition, will start 0.1 mg to be taken at night, we will discontinue losartan.  Medication Adjustments/Labs and Tests Ordered: Current medicines are reviewed at length with the patient today.  Concerns regarding medicines are outlined above. Labs and tests ordered and medication changes are outlined in the patient instructions below:  Patient Instructions  Medication Instructions:   STOP TAKING LOSARTAN (COZAAR) NOW  START TAKING CLONIDINE 0.1 MG BY MOUTH DAILY AT BEDTIME  *If you need a refill on your cardiac medications before your next appointment, please call your pharmacy*   Testing/Procedures:  Your physician has requested that you have an echocardiogram. Echocardiography is a painless test that uses sound waves to create images of your heart. It provides your doctor with information about the size and shape of your heart and how well your heart's chambers and valves are working. This procedure takes approximately one hour. There are no restrictions for this procedure.  PLEASE SCHEDULE ECHO A WEEK PRIOR TO HER NEXT FOLLOW-UP APPOINTMENT WITH DR. Meda Coffee IN 3-4 MONTHS   Follow-Up:  3-4 MONTHS WITH DR. Meda Coffee IN THE OFFICE WITH AN ECHO A WEEK PRIOR TO THAT VISIT--SCHEDULING YOU MAY ADD PATIENT TO DR. Francesca Oman HELD SCHEDULE FOR FOLLOW-UP ON 08/30/20    Signed, Ena Dawley, MD  05/24/2020 9:44 AM    [place]

## 2020-05-24 DIAGNOSIS — M25571 Pain in right ankle and joints of right foot: Secondary | ICD-10-CM | POA: Diagnosis not present

## 2020-05-31 DIAGNOSIS — M25571 Pain in right ankle and joints of right foot: Secondary | ICD-10-CM | POA: Diagnosis not present

## 2020-06-07 ENCOUNTER — Other Ambulatory Visit: Payer: Self-pay

## 2020-06-07 ENCOUNTER — Encounter (INDEPENDENT_AMBULATORY_CARE_PROVIDER_SITE_OTHER): Payer: Self-pay | Admitting: Physician Assistant

## 2020-06-07 ENCOUNTER — Ambulatory Visit (INDEPENDENT_AMBULATORY_CARE_PROVIDER_SITE_OTHER): Payer: Medicare PPO | Admitting: Physician Assistant

## 2020-06-07 VITALS — BP 150/77 | HR 60 | Temp 98.3°F | Ht 66.0 in | Wt 202.0 lb

## 2020-06-07 DIAGNOSIS — Z6832 Body mass index (BMI) 32.0-32.9, adult: Secondary | ICD-10-CM

## 2020-06-07 DIAGNOSIS — I10 Essential (primary) hypertension: Secondary | ICD-10-CM

## 2020-06-07 DIAGNOSIS — E785 Hyperlipidemia, unspecified: Secondary | ICD-10-CM

## 2020-06-07 DIAGNOSIS — E669 Obesity, unspecified: Secondary | ICD-10-CM | POA: Diagnosis not present

## 2020-06-07 DIAGNOSIS — E1169 Type 2 diabetes mellitus with other specified complication: Secondary | ICD-10-CM | POA: Diagnosis not present

## 2020-06-07 DIAGNOSIS — E119 Type 2 diabetes mellitus without complications: Secondary | ICD-10-CM | POA: Diagnosis not present

## 2020-06-08 NOTE — Progress Notes (Addendum)
Phone [phone]   Subjective:  Patient presents today for their annual physical. Chief complaint-noted.   See problem oriented charting- ROS- full  review of systems was completed and negative except for: hot flashes, hearing loss  The following were reviewed and entered/updated in epic: Past Medical History:  Diagnosis Date  . Allergy   . Anxiety   . Arthritis    back & knee  . Asthma     mild per pt shows up with resp illness  . Colon polyps   . Constipation   . Diabetes mellitus without complication ([place])   . Dyspnea   . Gallstones   . Gastric polyps   . Gastroparesis   . GERD (gastroesophageal reflux disease)   . Headache    sinus headaches and migraines at times  . Heart murmur   . History of migraine headaches   . HTN (hypertension)   . Hyperlipidemia   . IBS (irritable bowel syndrome)   . Joint pain   . Lumbar disc disease   . S/P aortic valve replacement with bioprosthetic valve 08/23/2016   25 mm Edwards Intuity rapid-deployment bovine pericardial tissue valve via partial upper mini sternotomy  . Sleep apnea    CPAP  . TIA (transient ischemic attack)    Patient Active Problem List   Diagnosis Date Noted  . History of CVA in adulthood 08/13/2017    Priority: High  . S/P minimally invasive aortic valve replacement with bioprosthetic valve 08/23/2016    Priority: High  . Diastolic dysfunction     Priority: High  . Diabetes mellitus type 2, controlled ([place]) 05/12/2014    Priority: High  . Vitamin D deficiency 06/04/2018    Priority: Medium  . Hyponatremia 02/17/2018    Priority: Medium  . Hypertension associated with diabetes ([place]) 01/02/2017    Priority: Medium  . Osteopenia 05/27/2016    Priority: Medium  . OSA (obstructive sleep apnea) 02/27/2016    Priority: Medium  . Elevated alkaline phosphatase level 05/16/2015    Priority: Medium  . GERD (gastroesophageal reflux disease) 03/10/2014    Priority: Medium  . Generalized anxiety disorder  03/10/2014    Priority: Medium  . Migraines 03/10/2014    Priority: Medium  . Irritable bowel syndrome 12/15/2008    Priority: Medium  . Hyperlipidemia associated with type 2 diabetes mellitus ([place]) 02/09/2008    Priority: Medium  . Blurry vision 08/13/2017    Priority: Low  . Bruxism 10/23/2015    Priority: Low  . Atypical chest pain 11/10/2014    Priority: Low  . Acne 05/12/2014    Priority: Low  . EUSTACHIAN TUBE DYSFUNCTION, CHRONIC 06/14/2010    Priority: Low  . Low back pain 08/11/2008    Priority: Low  . Allergic rhinitis 02/12/2007    Priority: Low  . Asthma 02/12/2007    Priority: Low  . Idiopathic hyperphosphatasia 08/03/2019  . Obesity (BMI 30.0-34.9) 10/23/2015   Past Surgical History:  Procedure Laterality Date  . ABDOMINAL HYSTERECTOMY    . AORTIC VALVE REPLACEMENT N/A 08/23/2016   Procedure: AORTIC VALVE REPLACEMENT (AVR) - using partial Upper Sternotomy- 63m Edwards Intuity Aortic Valve used;  Surgeon: CRexene Alberts MD;  [facility]  Service: Open Heart Surgery;  Laterality: N/A;  . BLADDER SUSPENSION  2007  . BREAST ENHANCEMENT SURGERY  1980  . CARPAL TUNNEL RELEASE Bilateral   . COLONOSCOPY    . DORSAL COMPARTMENT RELEASE Right 09/15/2014   Procedure: RIGHT WRIST DEQUERVAINS RELEASE ;  Surgeon:  Kathryne Hitch, MD;  Location: [facility];  Service: Orthopedics;  Laterality: Right;  . ESOPHAGOGASTRODUODENOSCOPY    . EYE SURGERY     cataract surgery  . KNEE ARTHROSCOPY  04/15/2012   Procedure: ARTHROSCOPY KNEE;  Surgeon: [name], MD;  Location: [facility];  Service: Orthopedics;  Laterality: Right;  . LAPAROSCOPIC CHOLECYSTECTOMY    . LASIK    . OOPHORECTOMY    . PALATE TO GINGIVA GRAFT  2017  . RIGHT/LEFT HEART CATH AND CORONARY ANGIOGRAPHY N/A 08/02/2016   Procedure: Right/Left Heart Cath and Coronary Angiography;  Surgeon: Sherren Mocha, MD;  Location: [facility];  Service: Cardiovascular;  Laterality: N/A;  . ROTATOR  CUFF REPAIR Left   . TEE WITHOUT CARDIOVERSION N/A 08/23/2016   Procedure: TRANSESOPHAGEAL ECHOCARDIOGRAM (TEE);  Surgeon: Rexene Alberts, MD;  Location: [facility];  Service: Open Heart Surgery;  Laterality: N/A;  . TOTAL KNEE ARTHROPLASTY  04/15/2012   Procedure: TOTAL KNEE ARTHROPLASTY;  Surgeon: [name], MD;  Location: [facility];  Service: Orthopedics;  Laterality: Right;  . TRIGGER FINGER RELEASE Right 04/20/2015   Procedure: RIGHT TRIGGER FINGER RELEASE (TENDON SHEALTH INCISION) ;  Surgeon: [name], MD;  Location: [facility];  Service: Orthopedics;  Laterality: Right;    Family History  Problem Relation Age of Onset  . COPD Mother   . Colon polyps Mother   . Irritable bowel syndrome Mother   . Anxiety disorder Mother   . Heart disease Father   . Alcohol abuse Father   . Breast cancer Maternal Grandmother   . Gallbladder disease Maternal Grandmother   . Colon polyps Maternal Aunt   . Diabetes Paternal Uncle        several uncles  . Other Neg Hx        hyponatremia  . Colon cancer Neg Hx   . Esophageal cancer Neg Hx   . Rectal cancer Neg Hx   . Stomach cancer Neg Hx     Medications- reviewed and updated Current Outpatient Medications  Medication Sig Dispense Refill  . atorvastatin (LIPITOR) 40 MG tablet TAKE 1 TABLET (40 MG TOTAL) BY MOUTH DAILY AT 6 PM. 90 tablet 3  . Biotin 5000 MCG TABS Take 5,000 mcg by mouth daily.     . blood glucose meter kit and supplies KIT Dispense based on patient and insurance preference. Use up to four times daily as directed. Dx: E11.9 1 each 0  . calcium-vitamin D (OSCAL WITH D) 500-200 MG-UNIT per tablet Take 2 tablets by mouth daily.     . Cholecalciferol (VITAMIN D) 125 MCG (5000 UT) CAPS Take 5,000 Units by mouth daily.    . cloNIDine (CATAPRES) 0.1 MG tablet Take 1 tablet (0.1 mg total) by mouth at bedtime. 90 tablet 2  . clopidogrel (PLAVIX) 75 MG tablet Take 1 tablet (75 mg total) by mouth daily. 90 tablet 3  .  demeclocycline (DECLOMYCIN) 300 MG tablet Take 1 tablet (300 mg total) by mouth 2 (two) times daily. 180 tablet 3  . fluticasone (FLONASE) 50 MCG/ACT nasal spray Place 2 sprays into both nostrils daily.     [name] glucose blood test strip Use to test blood sugars daily. Dx: E11.9 100 each 12  . ipratropium (ATROVENT) 0.03 % nasal spray 2 SPRAYS IN EACH NOSTRIL AS NEEDED THREE TIMES AS NEEDED    . loratadine (CLARITIN) 10 MG tablet Take 10 mg by mouth at bedtime.     [name]  Melatonin 5 MG TABS Take by mouth.     . metFORMIN (GLUCOPHAGE) 500 MG tablet TAKE 1 TABLET BY MOUTH WITH BREAKFAST AND 1/2-1 WITH DINNER 180 tablet 1  . metoCLOPramide (REGLAN) 10 MG tablet TAKE 1/2 TABLET BY MOUTH 4 TIMES A DAY 60 tablet 3  . Multiple Vitamin (MULTIVITAMIN WITH MINERALS) TABS tablet Take 1 tablet by mouth daily.    . naproxen sodium (ALEVE) 220 MG tablet Take 220 mg by mouth.    . OMEPRAZOLE MAGNESIUM PO Take 40 mg by mouth daily with breakfast.     . ondansetron (ZOFRAN ODT) 4 MG disintegrating tablet Take 1 tablet (4 mg total) by mouth every 8 (eight) hours as needed. 10 tablet 0  . Probiotic Product (ALIGN) 4 MG CAPS Take 4 mg by mouth daily.     . psyllium (METAMUCIL SMOOTH TEXTURE) 28 % packet Take 1 packet by mouth daily before breakfast.    . sodium chloride 1 g tablet Take 6 g by mouth daily.    [name] venlafaxine XR (EFFEXOR-XR) 75 MG 24 hr capsule TAKE 1 CAPSULE (75 MG TOTAL) BY MOUTH DAILY WITH BREAKFAST. 90 capsule 2   No current facility-administered medications for this visit.    Allergies-reviewed and updated Allergies  Allergen Reactions  . Augmentin [Amoxicillin-Pot Clavulanate] Rash    rash  . Erythromycin Nausea Only  . Penicillins Itching and Rash    Has patient had a PCN reaction causing immediate rash, facial/tongue/throat swelling, SOB or lightheadedness with hypotension: No Has patient had a PCN reaction causing severe rash involving mucus membranes or skin necrosis: No Has patient had a PCN  reaction that required hospitalization: No Has patient had a PCN reaction occurring within the last 10 years: No  If all of the above answers are "NO", then may proceed with Cephalosporin use.     Social History   Social History Narrative   She lives with husband (1978) and two dogs. Step-son [name] (1971-psychiatrist in [place]). No grandkids. 2 dogs-sheltie/collie mix and border collie mix      Highest level of education:  Master in education   She is retired Animal nutritionist x 32 years.      Hobbies: time with dogs   Right handed   One story home         Objective  Objective:  BP 134/78   Pulse 60   Temp 97.6 F (36.4 C) (Temporal)   Resp 18   Ht 5' 6"  (1.676 m)   Wt 202 lb 6.4 oz (91.8 kg)   LMP  (LMP Unknown)   SpO2 98%   BMI 32.67 kg/m  Gen: NAD, resting comfortably HEENT: Mucous membranes are moist. Oropharynx normal Neck: no thyromegaly CV: RRR stable systolic murmur Lungs: CTAB no crackles, wheeze, rhonchi Abdomen: soft/nontender/nondistended/normal bowel sounds. No rebound or guarding.  Ext: no edema Skin: warm, dry Neuro: grossly normal, moves all extremities, PERRLA, slight baseline twitch in facial muscles, slight tremor at times in right arm   Assessment and Plan   69 y.o. female presenting for annual physical.  Health Maintenance counseling: 1. Anticipatory guidance: Patient counseled regarding regular dental exams q6 months, eye exams- yearly follow up after cataracts,  avoiding smoking and second hand smoke, limiting alcohol to 1 beverage per day.   2. Risk factor reduction:  Advised patient of need for regular exercise and diet rich and fruits and vegetables to reduce risk of heart attack and stroke. Exercise-  3-4 days a week walking. Diet-has  done a phenomenal job over last few years- reports metabolism retested and has improved- able to do more calories now as a result and still down 4 lbs from november.  Wt Readings from Last 3 Encounters:   06/09/20 202 lb 6.4 oz (91.8 kg)  06/07/20 202 lb (91.6 kg)  05/23/20 206 lb (93.4 kg)  3. Immunizations/screenings/ancillary studies- fully up to date  Immunization History  Administered Date(s) Administered  . Influenza Split 04/04/2011, 03/05/2012  . Influenza Whole 04/16/2007, 03/16/2009, 03/13/2010  . Influenza, High Dose Seasonal PF 01/31/2019, 02/27/2020, 02/28/2020  . Influenza,inj,Quad PF,6+ Mos 03/30/2013, 03/10/2014, 03/23/2015, 02/27/2016  . Influenza-Unspecified 02/27/2017, 02/28/2017, 01/31/2018  . PFIZER SARS-COV-2 Vaccination 08/15/2019, 09/08/2019, 03/31/2020  . Pneumococcal Conjugate-13 05/21/2016  . Pneumococcal Polysaccharide-23 05/23/2017  . Td 06/24/1996, 08/06/2007, 11/07/2017  . Tdap 11/07/2017  . Zoster 05/12/2014  . Zoster Recombinat (Shingrix) 01/21/2019, 03/24/2019  4. Cervical cancer screening- followed by Dr. Ouida Sills gyn Jake Shark this visit)- no abnormal pap history. Past age based screening requirements 5. Breast cancer screening-  breast exam  With GYN and mammogram 07/06/19 6. Colon cancer screening - due 2026 with last 2016 with Dr. [name]  7. Skin cancer screening- Dr. Elvera Lennox yearly in march. advised regular sunscreen use. 8. Birth control/STD check-postmenopausal/monogamous 9. Osteoporosis screening at 85-  osteopenia 05/2018 actually improved - we are planning on repeat next year -Never smoker  Status of chronic or acute concerns   #History of stroke-remains on Plavix long-term   %s/p aortic valve replacement with bioprosthetic valve- requires dental prophylaxis. Follows with Dr. Meda Coffee. Catheterization 08/02/16 with minor nonobstructive CAD. Echo planned February 2022  % OSA- remains on CPAP with Dr. Halford Chessman  %obesity- working hard with weigh to wellness program  # Diabetes S: compliant with metformin 576m BID- now down to 500 AM, 250 PM.  Lab Results  Component Value Date   HGBA1C 5.7 (H) 04/20/2020   HGBA1C 5.9 (A) 12/09/2019    HGBA1C 6.1 06/08/2019   A/P:  Well controlled- will reduce metformin to just 5043min the AM and recheck next visit  #hypertension/diastolic dysfunction (prior listed as CHF but no chronic lasix need and no hospitalization for HF) S: now on clonidine 0.27m7mightly to see if helps with sweating issues In past losartan 25 mg but stopped to see if clonidine would help sweating issues. Prior on metoprolol 12.5mg12mD and lasix BP Readings from Last 3 Encounters:  06/09/20 134/78  06/07/20 (!) 150/77  05/23/20 126/72  A/P: well controlled today- continue current meds recently changed by Dr. NelsMeda Coffeeyperlipidemia/history of stroke S: for lipids compliant with  atorvastatin 40mg127mPatient also compliant with stroke history- plavix  Lab Results  Component Value Date   CHOL 141 04/20/2020   HDL 65 04/20/2020   LDLCALC 63 04/20/2020   LDLDIRECT 58.0 11/25/2017   TRIG 66 04/20/2020   CHOLHDL 2.2 04/20/2020   A/P: Excellent control-continue current medication  # generalized anxiety/SIADH S: patient is compliant with venlafaxine 75mg.57m are using lower dose as had more hyponatremia in the past- possible SIADH.  A/P: reasonable control- continue current meds  -for SIADH continue Dr. EllisoLoanne Drillingw up- patient still on salt tablets and demecloycline   # Migraines - follows with Dr. Jaffe Tomi Likensient with history of stroke- sumatriptan was stopped due to stroke history. Venlafaxine mentioned for migraine prophylaxis. PRN treatment with ibuprofen or exedrin with reglan per Dr. Jaffe Tomi Likensideal with plavix)- thought lower risk than sumatriptan  A/P: reasonable control  lately perhaps 5 in last year- continue current meds   # Asthma/allergic rhinitis- Dr. Harold Hedge S: doing well  recently with prn albuterol only- she is using each AM and she reports he is ok with it. Compliant with claritin, astelin, nasonex, saline. 2020 stopped allergy shots A/P: albuterol sounds poorly controlled to me but she  states Dr. Harold Hedge is ok with regimen- will defer to his expertise   # Low back pain/degenerative disc disease S: intermittent issues- has been better with weight loss. Should avoid nsaids with plavix   A/P: reasonable control lately- did have to get injection within last 6 months   # GERD S:Dr. [name] has advised higher dose omeprazole at 20m per patient so she wants to continue this dose- compliant   A/P: reasonable control and this should reduce GI bleeding risk   #alk phos hig risk- see mychart 06/09/2019- Dr. ELoanne Drillingthinks could be from her back- no further workup right now   Recommended follow up: Return in about 6 months (around 12/08/2020) for follow up- or sooner if needed. Future Appointments  Date Time Provider DAshaway 07/04/2020 11:00 AM AAbby Potash PA-C [facility] None  07/20/2020  2:30 PM JPieter Partridge DO LBN[facility] None  08/15/2020  3:15 PM ERenato Shin MD [facility]ENDO None  08/21/2020  3:05 PM [facility]  08/30/2020  9:40 AM NDorothy Spark MD [facility]  01/11/2021 11:00 AM [place], TFonnie Mu NP LBPU[facility]PULCARE None  04/27/2021 11:00 AM [facility] HEALTH COACH [facility] PEC    Chief Complaint  Patient presents with  . Annual Exam   Lab/Order associations:non fasting( patient had recent labs done at [facility] wellness)   ICD-10-CM   1. Preventative health care  Z00.00   2. Hypertension associated with diabetes ([place]  E11.59    I15.2   3. Mild intermittent asthma without complication  JB02.11  4. Gastroesophageal reflux disease without esophagitis  K21.9   5. Controlled type 2 diabetes mellitus without complication, without long-term current use of insulin ([facility])  E11.9   6. Hyperlipidemia associated with type 2 diabetes mellitus ([facility])  E11.69    E78.5   7. Generalized anxiety disorder  F41.1   8. Vitamin D deficiency  E55.9     No orders of the defined types were placed in this encounter.   Return  precautions advised.  SGarret Reddish MD

## 2020-06-08 NOTE — Patient Instructions (Addendum)
Please stop by lab before you go If you have mychart- we will send your results within 3 business days of [name] receiving them.  If you do not have mychart- we will call you about results within 5 business days of [name] receiving them.  *please note we are currently using [facility] which has a longer processing time than [place] typically so labs may not come back as quickly as in the past *please also note that you will see labs on mychart as soon as they post. I will later go in and write notes on them- will say "notes from Dr. Yong Channel"  Glad you are doing well. Happy Holidays to you!

## 2020-06-09 ENCOUNTER — Other Ambulatory Visit: Payer: Self-pay | Admitting: *Deleted

## 2020-06-09 ENCOUNTER — Ambulatory Visit (INDEPENDENT_AMBULATORY_CARE_PROVIDER_SITE_OTHER): Payer: Medicare PPO | Admitting: Family Medicine

## 2020-06-09 ENCOUNTER — Other Ambulatory Visit: Payer: Self-pay

## 2020-06-09 ENCOUNTER — Encounter: Payer: Self-pay | Admitting: Family Medicine

## 2020-06-09 VITALS — BP 134/78 | HR 60 | Temp 97.6°F | Resp 18 | Ht 66.0 in | Wt 202.4 lb

## 2020-06-09 DIAGNOSIS — E119 Type 2 diabetes mellitus without complications: Secondary | ICD-10-CM

## 2020-06-09 DIAGNOSIS — E1169 Type 2 diabetes mellitus with other specified complication: Secondary | ICD-10-CM

## 2020-06-09 DIAGNOSIS — E1159 Type 2 diabetes mellitus with other circulatory complications: Secondary | ICD-10-CM

## 2020-06-09 DIAGNOSIS — I152 Hypertension secondary to endocrine disorders: Secondary | ICD-10-CM

## 2020-06-09 DIAGNOSIS — K219 Gastro-esophageal reflux disease without esophagitis: Secondary | ICD-10-CM

## 2020-06-09 DIAGNOSIS — Z0000 Encounter for general adult medical examination without abnormal findings: Secondary | ICD-10-CM | POA: Diagnosis not present

## 2020-06-09 DIAGNOSIS — J4520 Mild intermittent asthma, uncomplicated: Secondary | ICD-10-CM | POA: Diagnosis not present

## 2020-06-09 DIAGNOSIS — F411 Generalized anxiety disorder: Secondary | ICD-10-CM

## 2020-06-09 DIAGNOSIS — E785 Hyperlipidemia, unspecified: Secondary | ICD-10-CM

## 2020-06-09 DIAGNOSIS — E559 Vitamin D deficiency, unspecified: Secondary | ICD-10-CM

## 2020-06-09 MED ORDER — METFORMIN HCL 500 MG PO TABS
ORAL_TABLET | ORAL | 3 refills | Status: DC
Start: 2020-06-09 — End: 2020-08-09

## 2020-06-09 NOTE — Assessment & Plan Note (Signed)
S: compliant with metformin 500mg  BID- now down to 500 AM, 250 PM.  Lab Results  Component Value Date   HGBA1C 5.7 (H) 04/20/2020   HGBA1C 5.9 (A) 12/09/2019   HGBA1C 6.1 06/08/2019   A/P:  Well controlled- will reduce metformin to just 500mg  in the AM and recheck next visit

## 2020-06-09 NOTE — Assessment & Plan Note (Signed)
S: now on clonidine 0.1mg  nightly to see if helps with sweating issues In past losartan 25 mg but stopped to see if clonidine would help sweating issues. Prior on metoprolol 12.5mg  BID and lasix BP Readings from Last 3 Encounters:  06/09/20 134/78  06/07/20 (!) 150/77  05/23/20 126/72  A/P: well controlled today- continue current meds recently changed by Dr. Meda Coffee

## 2020-06-10 LAB — COMPLETE METABOLIC PANEL WITH GFR
AG Ratio: 1.9 (calc) (ref 1.0–2.5)
ALT: 17 U/L (ref 6–29)
AST: 32 U/L (ref 10–35)
Albumin: 4.4 g/dL (ref 3.6–5.1)
Alkaline phosphatase (APISO): 156 U/L — ABNORMAL HIGH (ref 37–153)
BUN: 24 mg/dL (ref 7–25)
CO2: 23 mmol/L (ref 20–32)
Calcium: 9.6 mg/dL (ref 8.6–10.4)
Chloride: 101 mmol/L (ref 98–110)
Creat: 0.58 mg/dL (ref 0.50–0.99)
GFR, Est African American: 109 mL/min/{1.73_m2} (ref >=60)
GFR, Est Non African American: 94 mL/min/{1.73_m2} (ref >=60)
Globulin: 2.3 g/dL (calc) (ref 1.9–3.7)
Glucose, Bld: 86 mg/dL (ref 65–99)
Potassium: 4.9 mmol/L (ref 3.5–5.3)
Sodium: 136 mmol/L (ref 135–146)
Total Bilirubin: 0.5 mg/dL (ref 0.2–1.2)
Total Protein: 6.7 g/dL (ref 6.1–8.1)

## 2020-06-10 LAB — CBC WITH DIFFERENTIAL/PLATELET
Absolute Monocytes: 807 cells/uL (ref 200–950)
Basophils Absolute: 76 cells/uL (ref 0–200)
Basophils Relative: 0.7 %
Eosinophils Absolute: 76 cells/uL (ref 15–500)
Eosinophils Relative: 0.7 %
HCT: 39 % (ref 35.0–45.0)
Hemoglobin: 12.9 g/dL (ref 11.7–15.5)
Lymphs Abs: 3172 cells/uL (ref 850–3900)
MCH: 29 pg (ref 27.0–33.0)
MCHC: 33.1 g/dL (ref 32.0–36.0)
MCV: 87.6 fL (ref 80.0–100.0)
MPV: 11.5 fL (ref 7.5–12.5)
Monocytes Relative: 7.4 %
Neutro Abs: 6769 cells/uL (ref 1500–7800)
Neutrophils Relative %: 62.1 %
Platelets: 338 10*3/uL (ref 140–400)
RBC: 4.45 10*6/uL (ref 3.80–5.10)
RDW: 12.1 % (ref 11.0–15.0)
Total Lymphocyte: 29.1 %
WBC: 10.9 10*3/uL — ABNORMAL HIGH (ref 3.8–10.8)

## 2020-06-12 NOTE — Progress Notes (Signed)
Chief Complaint:   OBESITY [name] is here to discuss her progress with her obesity treatment plan along with follow-up of her obesity related diagnoses. 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 [name] states she is walking 15-20 minutes 3 times per week.  Today's visit was #: 39 Starting weight: 220 lbs Starting date: 01/19/2018 Today's weight: 202 lbs Today's date: 06/07/2020 Total lbs lost to date: 18 Total lbs lost since last in-office visit: 0  Interim History: [name] reports that she has been emotionally eating since she had to put her dog down. She believes she has been doing some excessive snacking. She has not been journaling consistently.   Subjective:   Diabetes mellitus with coincident hypertension ([place]). Fasting blood sugars 109-145. [name] is on metformin and denies polyphagia or hypoglycemia. Last A1c 5.7.  Lab Results  Component Value Date   HGBA1C 5.7 (H) 04/20/2020   HGBA1C 5.9 (A) 12/09/2019   HGBA1C 6.1 06/08/2019   Lab Results  Component Value Date   MICROALBUR <0.7 01/05/2019   [name] 63 04/20/2020   CREATININE 0.58 06/09/2020   Lab Results  Component Value Date   INSULIN 13.1 04/20/2020   INSULIN 12.1 01/19/2018   Hyperlipidemia associated with type 2 diabetes mellitus ([place]). [name] is on Lipitor. No chest pain or myalgias. Last lipids were at goal.  Lab Results  Component Value Date   CHOL 141 04/20/2020   HDL 65 04/20/2020   LDLCALC 63 04/20/2020   LDLDIRECT 58.0 11/25/2017   TRIG 66 04/20/2020   CHOLHDL 2.2 04/20/2020   Lab Results  Component Value Date   ALT 17 06/09/2020   AST 32 06/09/2020   ALKPHOS 171 (H) 04/20/2020   BILITOT 0.5 06/09/2020   The ASCVD Risk score Mikey Bussing DC Jr., et al., 2013) failed to calculate for the following reasons:   The  patient has a prior MI or stroke diagnosis  Assessment/Plan:   Diabetes mellitus with coincident hypertension ([place]). Good blood sugar control is important to decrease the likelihood of diabetic complications such as nephropathy, neuropathy, limb loss, blindness, coronary artery disease, and death. Intensive lifestyle modification including diet, exercise and weight loss are the first line of treatment for diabetes. [name] will continue her medication as directed. Will monitor A1c every 3 months.   Hyperlipidemia associated with type 2 diabetes mellitus ([place]). Cardiovascular risk and specific lipid/LDL goals reviewed.  We discussed several lifestyle modifications today and [name] will continue to work on diet, exercise and weight loss efforts. Orders and follow up as documented in patient record. She will continue her medication as directed. Will monitor lipids periodically.  Counseling Intensive lifestyle modifications are the first line treatment for this issue. . Dietary changes: Increase soluble fiber. Decrease simple carbohydrates. . Exercise changes: Moderate to vigorous-intensity aerobic activity 150 minutes per week if tolerated. . Lipid-lowering medications: see documented in medical record.  Class 1 obesity with serious comorbidity and body mass index (BMI) of 32.0 to 32.9 in adult, unspecified obesity type.  [name] is currently in the action stage of change. As such, her goal is to continue with weight loss efforts. She has agreed to keeping a food journal and adhering to recommended goals of 1600-1800 calories and 115 grams of protein daily.   Exercise goals: Older adults should follow the adult guidelines. When older adults cannot meet  the adult guidelines, they should be as physically active as their abilities and conditions will allow.   Behavioral modification strategies: holiday eating strategies  and keeping a strict food journal.  [name] has agreed to follow-up  with our clinic in 3 weeks. She was informed of the importance of frequent follow-up visits to maximize her success with intensive lifestyle modifications for her multiple health conditions.   Objective:   Blood pressure (!) 150/77, pulse 60, temperature 98.3 F (36.8 C), height 5\' 6"  (1.676 m), weight 202 lb (91.6 kg), SpO2 98 %. Body mass index is 32.6 kg/m.  General: Cooperative, alert, well developed, in no acute distress. HEENT: Conjunctivae and lids unremarkable. Cardiovascular: Regular rhythm.  Lungs: Normal work of breathing. Neurologic: No focal deficits.   Lab Results  Component Value Date   CREATININE 0.58 06/09/2020   BUN 24 06/09/2020   NA 136 06/09/2020   K 4.9 06/09/2020   CL 101 06/09/2020   CO2 23 06/09/2020   Lab Results  Component Value Date   ALT 17 06/09/2020   AST 32 06/09/2020   ALKPHOS 171 (H) 04/20/2020   BILITOT 0.5 06/09/2020   Lab Results  Component Value Date   HGBA1C 5.7 (H) 04/20/2020   HGBA1C 5.9 (A) 12/09/2019   HGBA1C 6.1 06/08/2019   HGBA1C 5.7 (A) 05/24/2019   HGBA1C 5.7 (A) 01/05/2019   Lab Results  Component Value Date   INSULIN 13.1 04/20/2020   INSULIN 12.1 01/19/2018   Lab Results  Component Value Date   TSH 3.38 06/04/2018   Lab Results  Component Value Date   CHOL 141 04/20/2020   HDL 65 04/20/2020   LDLCALC 63 04/20/2020   LDLDIRECT 58.0 11/25/2017   TRIG 66 04/20/2020   CHOLHDL 2.2 04/20/2020   Lab Results  Component Value Date   WBC 10.9 (H) 06/09/2020   HGB 12.9 06/09/2020   HCT 39.0 06/09/2020   MCV 87.6 06/09/2020   PLT 338 06/09/2020   No results found for: IRON, TIBC, FERRITIN  Obesity Behavioral Intervention:   Approximately 15 minutes were spent on the discussion below.  ASK: We discussed the diagnosis of obesity with [name] today and [name] agreed to give [name] permission to discuss obesity behavioral modification therapy today.  ASSESS: [name] has the diagnosis of obesity and her  BMI today is 32.6. [name] is in the action stage of change.   ADVISE: [name] was educated on the multiple health risks of obesity as well as the benefit of weight loss to improve her health. She was advised of the need for long term treatment and the importance of lifestyle modifications to improve her current health and to decrease her risk of future health problems.  AGREE: Multiple dietary modification options and treatment options were discussed and [name] agreed to follow the recommendations documented in the above note.  ARRANGE: [name] was educated on the importance of frequent visits to treat obesity as outlined per CMS and USPSTF guidelines and agreed to schedule her next follow up appointment today.  Attestation Statements:   Reviewed by clinician on day of visit: allergies, medications, problem list, medical history, surgical history, family history, social history, and previous encounter notes.  IMichaelene Song, am acting as transcriptionist for [name], PA-C   I have reviewed the above documentation for accuracy and completeness, and I agree with the above. [name], PA-C

## 2020-06-18 ENCOUNTER — Other Ambulatory Visit: Payer: Self-pay | Admitting: Family Medicine

## 2020-06-23 ENCOUNTER — Other Ambulatory Visit: Payer: Self-pay | Admitting: Cardiology

## 2020-06-23 DIAGNOSIS — I152 Hypertension secondary to endocrine disorders: Secondary | ICD-10-CM

## 2020-06-23 DIAGNOSIS — E782 Mixed hyperlipidemia: Secondary | ICD-10-CM

## 2020-06-23 DIAGNOSIS — Z953 Presence of xenogenic heart valve: Secondary | ICD-10-CM

## 2020-06-23 DIAGNOSIS — E1159 Type 2 diabetes mellitus with other circulatory complications: Secondary | ICD-10-CM

## 2020-06-23 DIAGNOSIS — I10 Essential (primary) hypertension: Secondary | ICD-10-CM

## 2020-07-03 ENCOUNTER — Ambulatory Visit (INDEPENDENT_AMBULATORY_CARE_PROVIDER_SITE_OTHER): Payer: Medicare PPO | Admitting: Physician Assistant

## 2020-07-04 ENCOUNTER — Other Ambulatory Visit: Payer: Self-pay

## 2020-07-04 ENCOUNTER — Ambulatory Visit (INDEPENDENT_AMBULATORY_CARE_PROVIDER_SITE_OTHER): Payer: Medicare PPO | Admitting: Physician Assistant

## 2020-07-04 ENCOUNTER — Encounter (INDEPENDENT_AMBULATORY_CARE_PROVIDER_SITE_OTHER): Payer: Self-pay | Admitting: Physician Assistant

## 2020-07-04 VITALS — BP 115/63 | HR 67 | Temp 98.9°F | Ht 66.0 in | Wt 201.0 lb

## 2020-07-04 DIAGNOSIS — E785 Hyperlipidemia, unspecified: Secondary | ICD-10-CM | POA: Diagnosis not present

## 2020-07-04 DIAGNOSIS — Z6832 Body mass index (BMI) 32.0-32.9, adult: Secondary | ICD-10-CM | POA: Diagnosis not present

## 2020-07-04 DIAGNOSIS — E669 Obesity, unspecified: Secondary | ICD-10-CM | POA: Diagnosis not present

## 2020-07-04 DIAGNOSIS — E559 Vitamin D deficiency, unspecified: Secondary | ICD-10-CM

## 2020-07-04 DIAGNOSIS — E1169 Type 2 diabetes mellitus with other specified complication: Secondary | ICD-10-CM

## 2020-07-05 NOTE — Progress Notes (Signed)
Chief Complaint:   OBESITY [name] is here to discuss her progress with her obesity treatment plan along with follow-up of her obesity related diagnoses. [name] is on keeping a food journal and adhering to recommended goals of 1600-1800 calories and 115 grams of protein daily and states she is following her eating plan approximately 80% of the time. [name] states she is walking for 20 minutes 3 times per week.  Today's visit was #: 64 Starting weight: 220 lbs Starting date: 01/19/2018 Today's weight: 201 lbs Today's date: 07/04/2020 Total lbs lost to date: 19 Total lbs lost since last in-office visit: 1  Interim History: [name] reports that she hasn't been journaling like she should. She continues to eat out for her dinner portion at lunch time daily.  Subjective:   1. Type 2 diabetes mellitus associated with hyperlipidemia [name]) [name]'s primary care physician decreased metformin to once daily. Last A1c was 5.7, and her fasting BGs range between 112 and 145.  2. Vitamin D deficiency [name] is on 5,000 units Vit D daily. Last Vit D level was at goal.  Assessment/Plan:   1. Type 2 diabetes mellitus associated with hyperlipidemia (HCC) Good blood sugar control is important to decrease the likelihood of diabetic complications such as nephropathy, neuropathy, limb loss, blindness, coronary artery disease, and death. Intensive lifestyle modification including diet, exercise and weight loss are the first line of treatment for diabetes. [name] will continue her medications and follow up with her primary care physician. Her next A1c is due in February.  2. Vitamin D deficiency Low Vitamin D level contributes to fatigue and are associated with obesity, breast, and colon cancer. [name] agreed to continue taking OTC Vitamin D 5,000 IU daily and will follow-up for routine testing of Vitamin D, at least 2-3 times per year to avoid over-replacement.  3. Class 1  obesity with serious comorbidity and body mass index (BMI) of 32.0 to 32.9 in adult, unspecified obesity type [name] is currently in the action stage of change. As such, her goal is to continue with weight loss efforts. She has agreed to keeping a food journal and adhering to recommended goals of 1600-1800 calories and 115 grams of protein daily.   Exercise goals: As is.  Behavioral modification strategies: planning for success and keeping a strict food journal.  [name] has agreed to follow-up with our clinic in 2 weeks. She was informed of the importance of frequent follow-up visits to maximize her success with intensive lifestyle modifications for her multiple health conditions.   Objective:   Blood pressure 115/63, pulse 67, temperature 98.9 F (37.2 C), height 5\' 6"  (1.676 m), weight 201 lb (91.2 kg), SpO2 99 %. Body mass index is 32.44 kg/m.  General: Cooperative, alert, well developed, in no acute distress. HEENT: Conjunctivae and lids unremarkable. Cardiovascular: Regular rhythm.  Lungs: Normal work of breathing. Neurologic: No focal deficits.   Lab Results  Component Value Date   CREATININE 0.58 06/09/2020   BUN 24 06/09/2020   NA 136 06/09/2020   K 4.9 06/09/2020   CL 101 06/09/2020   CO2 23 06/09/2020   Lab Results  Component Value Date   ALT 17 06/09/2020   AST 32 06/09/2020   ALKPHOS 171 (H) 04/20/2020   BILITOT 0.5 06/09/2020   Lab Results  Component Value Date   HGBA1C 5.7 (H) 04/20/2020   HGBA1C 5.9 (A) 12/09/2019   HGBA1C 6.1 06/08/2019   HGBA1C 5.7 (A) 05/24/2019   HGBA1C 5.7 (A) 01/05/2019  Lab Results  Component Value Date   INSULIN 13.1 04/20/2020   INSULIN 12.1 01/19/2018   Lab Results  Component Value Date   TSH 3.38 06/04/2018   Lab Results  Component Value Date   CHOL 141 04/20/2020   HDL 65 04/20/2020   LDLCALC 63 04/20/2020   LDLDIRECT 58.0 11/25/2017   TRIG 66 04/20/2020   CHOLHDL 2.2 04/20/2020   Lab Results   Component Value Date   WBC 10.9 (H) 06/09/2020   HGB 12.9 06/09/2020   HCT 39.0 06/09/2020   MCV 87.6 06/09/2020   PLT 338 06/09/2020   No results found for: IRON, TIBC, FERRITIN  Attestation Statements:   Reviewed by clinician on day of visit: allergies, medications, problem list, medical history, surgical history, family history, social history, and previous encounter notes.   Wilhemena Durie, am acting as transcriptionist for [employer], PA-C.  I have reviewed the above documentation for accuracy and completeness, and I agree with the above. [name], PA-C

## 2020-07-10 DIAGNOSIS — G4733 Obstructive sleep apnea (adult) (pediatric): Secondary | ICD-10-CM | POA: Diagnosis not present

## 2020-07-18 ENCOUNTER — Encounter (INDEPENDENT_AMBULATORY_CARE_PROVIDER_SITE_OTHER): Payer: Self-pay | Admitting: Physician Assistant

## 2020-07-18 ENCOUNTER — Ambulatory Visit (INDEPENDENT_AMBULATORY_CARE_PROVIDER_SITE_OTHER): Payer: Medicare PPO | Admitting: Physician Assistant

## 2020-07-18 ENCOUNTER — Other Ambulatory Visit: Payer: Self-pay

## 2020-07-18 VITALS — BP 155/63 | HR 67 | Temp 99.3°F | Ht 66.0 in | Wt 201.0 lb

## 2020-07-18 DIAGNOSIS — E785 Hyperlipidemia, unspecified: Secondary | ICD-10-CM

## 2020-07-18 DIAGNOSIS — G4733 Obstructive sleep apnea (adult) (pediatric): Secondary | ICD-10-CM

## 2020-07-18 DIAGNOSIS — E1169 Type 2 diabetes mellitus with other specified complication: Secondary | ICD-10-CM

## 2020-07-18 DIAGNOSIS — Z6832 Body mass index (BMI) 32.0-32.9, adult: Secondary | ICD-10-CM

## 2020-07-18 DIAGNOSIS — Z9989 Dependence on other enabling machines and devices: Secondary | ICD-10-CM | POA: Diagnosis not present

## 2020-07-18 DIAGNOSIS — E669 Obesity, unspecified: Secondary | ICD-10-CM | POA: Diagnosis not present

## 2020-07-18 NOTE — Progress Notes (Signed)
Chief Complaint:   OBESITY Lindzey is here to discuss her progress with her obesity treatment plan along with follow-up of her obesity related diagnoses. Katasha is on keeping a food journal and adhering to recommended goals of 1600-1800 calories and 115 grams of protein daily and states she is following her eating plan approximately 60% of the time. Jakita states she is doing 0 minutes 0 times per week.  Today's visit was #: 47 Starting weight: 220 lbs Starting date: 01/19/2018 Today's weight: 201 lbs Today's date: 07/18/2020 Total lbs lost to date: 19 Total lbs lost since last in-office visit: 0  Interim History: Rekita is not journaling daily. She was snacking more often during the snow days. Her hunger is controlled when she is on the plan. She continues to eat out for lunch but she eats her dinner portion. She is not exercising regularly.  Subjective:   1. Type 2 diabetes mellitus associated with hyperlipidemia (HCC) Arneisha's fasting BGs range between 124 and 140. She is on metformin, and last A1c was 5.7. She is followed by her primary care physician.  2. OSA on CPAP Katryn uses CPAP as prescribed, and she is sleeping well. She denies excessive daytime fatigue or morning headache.  Assessment/Plan:   1. Type 2 diabetes mellitus associated with hyperlipidemia (HCC) Good blood sugar control is important to decrease the likelihood of diabetic complications such as nephropathy, neuropathy, limb loss, blindness, coronary artery disease, and death. Intensive lifestyle modification including diet, exercise and weight loss are the first line of treatment for diabetes. We will continue to monitor her A1c, and Ilanna will continue her medications and increase exercise.  2. OSA on CPAP Intensive lifestyle modifications are the first line treatment for this issue. We discussed several lifestyle modifications today. Rakayla will continue her use of CPAP  nightly, and will continue to work on diet, exercise and weight loss efforts. We will continue to monitor. Orders and follow up as documented in patient record.   3. Class 1 obesity with serious comorbidity and body mass index (BMI) of 32.0 to 32.9 in adult, unspecified obesity type Candace is currently in the action stage of change. As such, her goal is to continue with weight loss efforts. She has agreed to keeping a food journal and adhering to recommended goals of 1600-1700 calories and 115 grams of protein daily.   Exercise goals: Exercise 1 time per week. Walking at home information was given.  Behavioral modification strategies: planning for success and keeping a strict food journal.  Mayar has agreed to follow-up with our clinic in 2 weeks. She was informed of the importance of frequent follow-up visits to maximize her success with intensive lifestyle modifications for her multiple health conditions.   Objective:   Blood pressure (!) 155/63, pulse 67, temperature 99.3 F (37.4 C), height 5\' 6"  (1.676 m), weight 201 lb (91.2 kg), SpO2 99 %. Body mass index is 32.44 kg/m.  General: Cooperative, alert, well developed, in no acute distress. HEENT: Conjunctivae and lids unremarkable. Cardiovascular: Regular rhythm.  Lungs: Normal work of breathing. Neurologic: No focal deficits.   Lab Results  Component Value Date   CREATININE 0.58 06/09/2020   BUN 24 06/09/2020   NA 136 06/09/2020   K 4.9 06/09/2020   CL 101 06/09/2020   CO2 23 06/09/2020   Lab Results  Component Value Date   ALT 17 06/09/2020   AST 32 06/09/2020   ALKPHOS 171 (H) 04/20/2020   BILITOT 0.5 06/09/2020  Lab Results  Component Value Date   HGBA1C 5.7 (H) 04/20/2020   HGBA1C 5.9 (A) 12/09/2019   HGBA1C 6.1 06/08/2019   HGBA1C 5.7 (A) 05/24/2019   HGBA1C 5.7 (A) 01/05/2019   Lab Results  Component Value Date   INSULIN 13.1 04/20/2020   INSULIN 12.1 01/19/2018   Lab Results  Component Value  Date   TSH 3.38 06/04/2018   Lab Results  Component Value Date   CHOL 141 04/20/2020   HDL 65 04/20/2020   LDLCALC 63 04/20/2020   LDLDIRECT 58.0 11/25/2017   TRIG 66 04/20/2020   CHOLHDL 2.2 04/20/2020   Lab Results  Component Value Date   WBC 10.9 (H) 06/09/2020   HGB 12.9 06/09/2020   HCT 39.0 06/09/2020   MCV 87.6 06/09/2020   PLT 338 06/09/2020   No results found for: IRON, TIBC, FERRITIN  Obesity Behavioral Intervention:   Approximately 15 minutes were spent on the discussion below.  ASK: We discussed the diagnosis of obesity with [name] today and Sander agreed to give [name] permission to discuss obesity behavioral modification therapy today.  ASSESS: Ericca has the diagnosis of obesity and her BMI today is 32.46. Donnalee is in the action stage of change.   ADVISE: Diona was educated on the multiple health risks of obesity as well as the benefit of weight loss to improve her health. She was advised of the need for long term treatment and the importance of lifestyle modifications to improve her current health and to decrease her risk of future health problems.  AGREE: Multiple dietary modification options and treatment options were discussed and Deaun agreed to follow the recommendations documented in the above note.  ARRANGE: Krissie was educated on the importance of frequent visits to treat obesity as outlined per CMS and USPSTF guidelines and agreed to schedule her next follow up appointment today.  Attestation Statements:   Reviewed by clinician on day of visit: allergies, medications, problem list, medical history, surgical history, family history, social history, and previous encounter notes.   Wilhemena Durie, am acting as transcriptionist for [employer], PA-C.  I have reviewed the above documentation for accuracy and completeness, and I agree with the above. [name], PA-C

## 2020-07-19 NOTE — Progress Notes (Signed)
NEUROLOGY FOLLOW UP OFFICE NOTE  [name] [id number]   Subjective:  [name] a 70 year old right-handed female with type 2 diabetes, hypertension, hyperlipidemia, status post bovine aortic valve and migraine who follows up for stroke and migraine.  UPDATE: Current medications: Plavix, atorvastatin 40 mg, Lopressor, insulin, metformin. For migraine prophylaxis, [name] is taking venlafaxine XR 44m daily.  Family medicine notes reviewed.  Maybe 2 or 3 migraines over the past year.   04/20/2020 LABS:  LDL 63, Hgb A1c 5.7  HISTORY: [name] history of migraines since high school. They are left sided frontal throbbing headaches of moderate to severe intensity. They typically last 3 hours and occur once a week. They are preceded by an aura of spots in the vision of both eyes, lasting 2 hours. They are aggravated by stress and relieved by rest. [name] typically takes Imitrex, which is effective.  On 08/07/17, [name] developed one of her habitual migraines with aura. However, the headache was more severe and the visual aura did not resolve. The headache was severe for about 3 days and then tapered off after another 4 days. The visual aura lasted up to a week. [name] did not have any facial droop, unilateral numbness or weakness, ataxia or vertigo. CT of head from 08/12/17 was personally reviewed and revealed a small patchy hypodensity in the right occipital lobe concerning for subacute infarct. MRA of head from 08/21/17 was personally reviewed and revealed flow-limiting stenosis versus artifact in the left M2 segement but otherwise intracranial arteries patent, including right PCA. Carotid doppler from 08/19/17 revealed no hemodynamically significant bilateral ICA stenosis and both vertebral arteries were patent with antegrade flow. Echocardiogram from yesterday showed EF 60-65% with no cardiac source of emboli. [name] is currently with heart monitor.  Her ASA 844m was switched to Plavix 7554mnd [name] was advised to take atorvastatin daily. [name] was advised to stop Imitrex. Tylenol is ineffective. [name] takes venlafaxine XR 150m89mily for anxiety.  PAST MEDICAL HISTORY: Past Medical History:  Diagnosis Date  . Allergy   . Anxiety   . Arthritis    back & knee  . Asthma     mild per pt shows up with resp illness  . Colon polyps   . Constipation   . Diabetes mellitus without complication (HCC)[place]. Dyspnea   . Gallstones   . Gastric polyps   . Gastroparesis   . GERD (gastroesophageal reflux disease)   . Headache    sinus headaches and migraines at times  . Heart murmur   . History of migraine headaches   . HTN (hypertension)   . Hyperlipidemia   . IBS (irritable bowel syndrome)   . Joint pain   . Lumbar disc disease   . S/P aortic valve replacement with bioprosthetic valve 08/23/2016   25 mm Edwards Intuity rapid-deployment bovine pericardial tissue valve via partial upper mini sternotomy  . Sleep apnea    CPAP  . TIA (transient ischemic attack)     MEDICATIONS: Current Outpatient Medications on File Prior to Visit  Medication Sig Dispense Refill  . atorvastatin (LIPITOR) 40 MG tablet TAKE 1 TABLET (40 MG TOTAL) BY MOUTH DAILY AT 6 PM. 90 tablet 3  . Biotin 5000 MCG TABS Take 5,000 mcg by mouth daily.     . blood glucose meter kit and supplies KIT Dispense based on patient and insurance preference. Use up to four times daily as directed. Dx: E11.9 1 each 0  .  calcium-vitamin D (OSCAL WITH D) 500-200 MG-UNIT per tablet Take 2 tablets by mouth daily.     . Cholecalciferol (VITAMIN D) 125 MCG (5000 UT) CAPS Take 5,000 Units by mouth daily.    . cloNIDine (CATAPRES) 0.1 MG tablet Take 1 tablet (0.1 mg total) by mouth at bedtime. 90 tablet 2  . clopidogrel (PLAVIX) 75 MG tablet Take 1 tablet (75 mg total) by mouth daily. 90 tablet 3  . demeclocycline (DECLOMYCIN) 300 MG tablet Take 1 tablet (300 mg total) by mouth 2 (two) times daily. 180  tablet 3  . fluticasone (FLONASE) 50 MCG/ACT nasal spray Place 2 sprays into both nostrils daily.     [name] glucose blood test strip Use to test blood sugars daily. Dx: E11.9 100 each 12  . ipratropium (ATROVENT) 0.03 % nasal spray 2 SPRAYS IN EACH NOSTRIL AS NEEDED THREE TIMES AS NEEDED    . loratadine (CLARITIN) 10 MG tablet Take 10 mg by mouth at bedtime.     . Melatonin 5 MG TABS Take by mouth.     . metFORMIN (GLUCOPHAGE) 500 MG tablet TAKE 1 TABLET BY MOUTH WITH BREAKFAST 90 tablet 3  . metoCLOPramide (REGLAN) 10 MG tablet TAKE 1/2 TABLET BY MOUTH 4 TIMES A DAY 60 tablet 3  . Multiple Vitamin (MULTIVITAMIN WITH MINERALS) TABS tablet Take 1 tablet by mouth daily.    . naproxen sodium (ALEVE) 220 MG tablet Take 220 mg by mouth.    . OMEPRAZOLE MAGNESIUM PO Take 40 mg by mouth daily with breakfast.     . ondansetron (ZOFRAN ODT) 4 MG disintegrating tablet Take 1 tablet (4 mg total) by mouth every 8 (eight) hours as needed. 10 tablet 0  . Probiotic Product (ALIGN) 4 MG CAPS Take 4 mg by mouth daily.     . psyllium (METAMUCIL SMOOTH TEXTURE) 28 % packet Take 1 packet by mouth daily before breakfast.    . sodium chloride 1 g tablet Take 6 g by mouth daily.    [name] venlafaxine XR (EFFEXOR-XR) 75 MG 24 hr capsule TAKE 1 CAPSULE (75 MG TOTAL) BY MOUTH DAILY WITH BREAKFAST. 90 capsule 2   No current facility-administered medications on file prior to visit.    ALLERGIES: Allergies  Allergen Reactions  . Augmentin [Amoxicillin-Pot Clavulanate] Rash    rash  . Erythromycin Nausea Only  . Penicillins Itching and Rash    Has patient had a PCN reaction causing immediate rash, facial/tongue/throat swelling, SOB or lightheadedness with hypotension: No Has patient had a PCN reaction causing severe rash involving mucus membranes or skin necrosis: No Has patient had a PCN reaction that required hospitalization: No Has patient had a PCN reaction occurring within the last 10 years: No  If all of the above  answers are "NO", then may proceed with Cephalosporin use.     FAMILY HISTORY: Family History  Problem Relation Age of Onset  . COPD Mother   . Colon polyps Mother   . Irritable bowel syndrome Mother   . Anxiety disorder Mother   . Heart disease Father   . Alcohol abuse Father   . Breast cancer Maternal Grandmother   . Gallbladder disease Maternal Grandmother   . Colon polyps Maternal Aunt   . Diabetes Paternal Uncle        several uncles  . Other Neg Hx        hyponatremia  . Colon cancer Neg Hx   . Esophageal cancer Neg Hx   . Rectal cancer Neg  Hx   . Stomach cancer Neg Hx     SOCIAL HISTORY: Social History   Socioeconomic History  . Marital status: Married    Spouse name: [name]  . Number of children: 1  . Years of education: Not on file  . Highest education level: Not on file  Occupational History  . Occupation: Retired, Curator: RETIRED  Tobacco Use  . Smoking status: Never Smoker  . Smokeless tobacco: Never Used  Vaping Use  . Vaping Use: Never used  Substance and Sexual Activity  . Alcohol use: No  . Drug use: No  . Sexual activity: Yes  Other Topics Concern  . Not on file  Social History Narrative   [name] lives with husband (1978) and two dogs. Step-son [name] (1971-psychiatrist in [place]). No grandkids. 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      Hobbies: time with dogs   Right handed   One story home         Social Determinants of Health   Financial Resource Strain: Low Risk   . Difficulty of Paying Living Expenses: Not hard at all  Food Insecurity: No Food Insecurity  . Worried About Charity fundraiser in the Last Year: Never true  . Ran Out of Food in the Last Year: Never true  Transportation Needs: No Transportation Needs  . Lack of Transportation (Medical): No  . Lack of Transportation  (Non-Medical): No  Physical Activity: Insufficiently Active  . Days of Exercise per Week: 3 days  . Minutes of Exercise per Session: 20 min  Stress: No Stress Concern Present  . Feeling of Stress : Not at all  Social Connections: Moderately Isolated  . Frequency of Communication with Friends and Family: More than three times a week  . Frequency of Social Gatherings with Friends and Family: Once a week  . Attends Religious Services: Never  . Active Member of Clubs or Organizations: No  . Attends Archivist Meetings: Never  . Marital Status: Married  Human resources officer Violence: Not At Risk  . Fear of Current or Ex-Partner: No  . Emotionally Abused: No  . Physically Abused: No  . Sexually Abused: No     Objective:  Blood pressure (!) 194/83, pulse (!) 59, height _0  (1.676 m), weight 208 lb (94.3 kg), SpO2 99 %. General: No acute distress.  Patient appears well-groomed.   Head:  Normocephalic/atraumatic Eyes:  Fundi examined but not visualized Neck: supple, no paraspinal tenderness, full range of motion Heart:  Regular rate and rhythm Lungs:  Clear to auscultation bilaterally Back: No paraspinal tenderness Neurological Exam: alert and oriented to person, place, and time. Attention span and concentration intact, recent and remote memory intact, fund of knowledge intact.  Speech fluent and not dysarthric, language intact.  CN II-XII intact. Bulk and tone normal, muscle strength 5/5 throughout.  Sensation to light touch intact.  Deep tendon reflexes 2+ throughout, toes downgoing.  Finger to nose testing intact.  Gait normal, Romberg negative.   Assessment/Plan:   1.  Migraine with persistent aura, with cerebral infarction 2.  Migraine with aura 3.  HTN 4.  Type 2 diabetes mellitus 5.  Hyperlipidemia  1.  Migraine prevention:  Venlafaxine XR 84m daily 2.  Limit use of pain relievers to no more than 2 days out of  week to prevent risk of rebound or medication-overuse  headache. 3.  Secondary stroke prevention as managed by PCP: -  Plavix 62m daily -  Statin therapy.  LDL goal less than 70 -  Blood pressure control - follow up with PCP -  Glycemic control.  Hgb A1c goal less than 7 4.  Follow up one year  AMetta Clines DO  CC: SBrayton Mars HYong Channel MD

## 2020-07-20 ENCOUNTER — Ambulatory Visit: Payer: Self-pay | Admitting: Neurology

## 2020-07-20 ENCOUNTER — Encounter: Payer: Self-pay | Admitting: Neurology

## 2020-07-20 ENCOUNTER — Ambulatory Visit: Payer: Medicare PPO | Admitting: Neurology

## 2020-07-20 ENCOUNTER — Other Ambulatory Visit: Payer: Self-pay

## 2020-07-20 VITALS — BP 194/83 | HR 59 | Ht 66.0 in | Wt 208.0 lb

## 2020-07-20 DIAGNOSIS — I152 Hypertension secondary to endocrine disorders: Secondary | ICD-10-CM

## 2020-07-20 DIAGNOSIS — E119 Type 2 diabetes mellitus without complications: Secondary | ICD-10-CM | POA: Diagnosis not present

## 2020-07-20 DIAGNOSIS — E785 Hyperlipidemia, unspecified: Secondary | ICD-10-CM

## 2020-07-20 DIAGNOSIS — I639 Cerebral infarction, unspecified: Secondary | ICD-10-CM

## 2020-07-20 DIAGNOSIS — G43609 Persistent migraine aura with cerebral infarction, not intractable, without status migrainosus: Secondary | ICD-10-CM | POA: Diagnosis not present

## 2020-07-20 DIAGNOSIS — E1159 Type 2 diabetes mellitus with other circulatory complications: Secondary | ICD-10-CM

## 2020-07-20 NOTE — Patient Instructions (Signed)
1.  Continue venlafaxine XR 75mg  daily 2.  Follow up in one year

## 2020-07-31 ENCOUNTER — Encounter: Payer: Self-pay | Admitting: Family Medicine

## 2020-08-01 ENCOUNTER — Telehealth (INDEPENDENT_AMBULATORY_CARE_PROVIDER_SITE_OTHER): Payer: Medicare PPO | Admitting: Family Medicine

## 2020-08-01 ENCOUNTER — Encounter (INDEPENDENT_AMBULATORY_CARE_PROVIDER_SITE_OTHER): Payer: Self-pay | Admitting: Family Medicine

## 2020-08-01 DIAGNOSIS — E119 Type 2 diabetes mellitus without complications: Secondary | ICD-10-CM | POA: Diagnosis not present

## 2020-08-01 DIAGNOSIS — Z6832 Body mass index (BMI) 32.0-32.9, adult: Secondary | ICD-10-CM | POA: Diagnosis not present

## 2020-08-01 DIAGNOSIS — E669 Obesity, unspecified: Secondary | ICD-10-CM

## 2020-08-01 DIAGNOSIS — E1169 Type 2 diabetes mellitus with other specified complication: Secondary | ICD-10-CM

## 2020-08-02 ENCOUNTER — Encounter (INDEPENDENT_AMBULATORY_CARE_PROVIDER_SITE_OTHER): Payer: Self-pay | Admitting: Family Medicine

## 2020-08-02 NOTE — Progress Notes (Signed)
TeleHealth Visit:  Due to the COVID-19 pandemic, this visit was completed with telemedicine (audio/video) technology to reduce patient and provider exposure as well as to preserve personal protective equipment.   [name] has verbally consented to this TeleHealth visit. The patient is located at home, the provider is located at the [facility] office. The participants in this visit include the listed provider and patient. The visit was conducted today via MyChart video.   Chief Complaint: OBESITY [name] is here to discuss her progress with her obesity treatment plan along with follow-up of her obesity related diagnoses. [name] is on keeping a food journal and adhering to recommended goals of 1600-1700 calories and 115 grams of protein daily and states she is following her eating plan approximately 75% of the time. [name] states she is doing 0 minutes 0 times per week.  Today's visit was #: 4 Starting weight: 220 lbs Starting date: 01/19/2018  Interim History: [name] started our program 01/19/18 at 220 lbs. Last in office weight was 201 on 07/18/20. Her RMR was 2428 on 04/20/2020, a 306 kcal increase from IC on 01/19/18 [name] has maintained her weight since last OV. She has not felt well and is not eating all of the food. She eats out for lunch but eats the cat 4 dinner food.  She has peanut better sandwich, yogurt, and milk for breakfast. She does have some hunger around 4 PM. She has moved food to have a snack at 4 PM.  Her goal weight is 170 lbs (27 BMI).  Subjective:   1. Type 2 diabetes mellitus with other specified complication, without long-term current use of insulin (HCC) [name] is on metformin. Last A1c was 5.7, and she denies hypoglycemia. She reports dose of metformin has been reduced by her PCP with weight loss. Her CBGs range between 120's and 130's mostly.  Lab Results  Component Value Date   HGBA1C 5.7 (H) 04/20/2020   HGBA1C 5.9 (A)  12/09/2019   HGBA1C 6.1 06/08/2019   Lab Results  Component Value Date   MICROALBUR <0.7 01/05/2019   LDLCALC 63 04/20/2020   CREATININE 0.58 06/09/2020   Lab Results  Component Value Date   INSULIN 13.1 04/20/2020   INSULIN 12.1 01/19/2018   Assessment/Plan:   1. Type 2 diabetes mellitus with other specified complication, without long-term current use of insulin (HCC)  [name] will continue metformin and will continue to follow up as directed.  2. Class 1 obesity with serious comorbidity and body mass index (BMI) of 32.0 to 32.9 in adult, unspecified obesity type [name] is currently in the action stage of change. As such, her goal is to continue with weight loss efforts. She has agreed to the Category 4 Plan or keeping a food journal and adhering to recommended goals of 1500-1600 calories and 115 grams of protein daily.   Exercise goals: She will start back walking or going to the gym.  Behavioral modification strategies: planning for success.  [name] has agreed to follow-up with our clinic in 2 to 3 weeks.   Objective:   VITALS: Per patient if applicable, see vitals. GENERAL: Alert and in no acute distress. CARDIOPULMONARY: No increased WOB. Speaking in clear sentences.  PSYCH: Pleasant and cooperative. Speech normal rate and rhythm. Affect is appropriate. Insight and judgement are appropriate. Attention is focused, linear, and appropriate.  NEURO: Oriented as arrived to appointment on time with no prompting.   Lab Results  Component Value Date   CREATININE 0.58 06/09/2020  BUN 24 06/09/2020   NA 136 06/09/2020   K 4.9 06/09/2020   CL 101 06/09/2020   CO2 23 06/09/2020   Lab Results  Component Value Date   ALT 17 06/09/2020   AST 32 06/09/2020   ALKPHOS 171 (H) 04/20/2020   BILITOT 0.5 06/09/2020   Lab Results  Component Value Date   HGBA1C 5.7 (H) 04/20/2020   HGBA1C 5.9 (A) 12/09/2019   HGBA1C 6.1 06/08/2019   HGBA1C 5.7 (A) 05/24/2019    HGBA1C 5.7 (A) 01/05/2019   Lab Results  Component Value Date   INSULIN 13.1 04/20/2020   INSULIN 12.1 01/19/2018   Lab Results  Component Value Date   TSH 3.38 06/04/2018   Lab Results  Component Value Date   CHOL 141 04/20/2020   HDL 65 04/20/2020   LDLCALC 63 04/20/2020   LDLDIRECT 58.0 11/25/2017   TRIG 66 04/20/2020   CHOLHDL 2.2 04/20/2020   Lab Results  Component Value Date   WBC 10.9 (H) 06/09/2020   HGB 12.9 06/09/2020   HCT 39.0 06/09/2020   MCV 87.6 06/09/2020   PLT 338 06/09/2020   No results found for: IRON, TIBC, FERRITIN  Attestation Statements:   Reviewed by clinician on day of visit: allergies, medications, problem list, medical history, surgical history, family history, social history, and previous encounter notes.   Wilhemena Durie, am acting as Location manager for [employer], FNP-C.  I have reviewed the above documentation for accuracy and completeness, and I agree with the above. - Georgianne Fick, FNP

## 2020-08-06 ENCOUNTER — Other Ambulatory Visit: Payer: Self-pay | Admitting: Family Medicine

## 2020-08-09 ENCOUNTER — Other Ambulatory Visit: Payer: Self-pay | Admitting: Family Medicine

## 2020-08-11 ENCOUNTER — Other Ambulatory Visit: Payer: Self-pay

## 2020-08-15 ENCOUNTER — Ambulatory Visit: Payer: Medicare PPO | Admitting: Endocrinology

## 2020-08-15 ENCOUNTER — Other Ambulatory Visit: Payer: Self-pay

## 2020-08-15 DIAGNOSIS — E871 Hypo-osmolality and hyponatremia: Secondary | ICD-10-CM

## 2020-08-15 LAB — BRAIN NATRIURETIC PEPTIDE: Pro B Natriuretic peptide (BNP): 58 pg/mL (ref 0.0–100.0)

## 2020-08-15 LAB — BASIC METABOLIC PANEL
BUN: 23 mg/dL (ref 6–23)
CO2: 28 mEq/L (ref 19–32)
Calcium: 9.4 mg/dL (ref 8.4–10.5)
Chloride: 97 mEq/L (ref 96–112)
Creatinine, Ser: 0.65 mg/dL (ref 0.40–1.20)
GFR: 89.64 mL/min (ref >60.00)
Glucose, Bld: 86 mg/dL (ref 70–99)
Potassium: 4.6 mEq/L (ref 3.5–5.1)
Sodium: 132 mEq/L — ABNORMAL LOW (ref 135–145)

## 2020-08-15 LAB — HEPATIC FUNCTION PANEL
ALT: 19 U/L (ref 0–35)
AST: 33 U/L (ref 0–37)
Albumin: 4.5 g/dL (ref 3.5–5.2)
Alkaline Phosphatase: 149 U/L — ABNORMAL HIGH (ref 39–117)
Bilirubin, Direct: 0.1 mg/dL (ref 0.0–0.3)
Total Bilirubin: 0.5 mg/dL (ref 0.2–1.2)
Total Protein: 7.1 g/dL (ref 6.0–8.3)

## 2020-08-15 NOTE — Patient Instructions (Addendum)
Your blood pressure is high today.  Please see your primary care provider soon, to have it rechecked.   ?We'll continue to follow the alkaline phosphatase.   ?Blood tests are requested for you today.  We'll let you know about the results.   ?Please come back for a follow-up appointment in 6 months.   ?

## 2020-08-15 NOTE — Progress Notes (Signed)
Subjective:    Patient ID: [name], female    DOB: Jul 23, 1950, 70 y.o.   MRN: [id number]  HPI Pt returns for f/u of hyponatremia (dx'ed 2013; only cause found was metaclopramide, which she cannot safely stop; she takes demeclocycline and NaCl; ACTH stim test, creat, and TFT were normal; MRI showed old CVA's; pt says IBS-C requires her to drink fluids).  pt states she feels well in general.  She takes meds as rx'ed Pt also has h/o elev AP (etiol is uncertain; isoenzymes suggested bone cause; bone scan showed multifocal degenerative changes. No evidence of acute or destructive process; plan is to follow).   Past Medical History:  Diagnosis Date  . Allergy   . Anxiety   . Arthritis    back & knee  . Asthma     mild per pt shows up with resp illness  . Colon polyps   . Constipation   . Diabetes mellitus without complication ([place])   . Dyspnea   . Gallstones   . Gastric polyps   . Gastroparesis   . GERD (gastroesophageal reflux disease)   . Headache    sinus headaches and migraines at times  . Heart murmur   . History of migraine headaches   . HTN (hypertension)   . Hyperlipidemia   . IBS (irritable bowel syndrome)   . Joint pain   . Lumbar disc disease   . S/P aortic valve replacement with bioprosthetic valve 08/23/2016   25 mm Edwards Intuity rapid-deployment bovine pericardial tissue valve via partial upper mini sternotomy  . Sleep apnea    CPAP  . TIA (transient ischemic attack)     Past Surgical History:  Procedure Laterality Date  . ABDOMINAL HYSTERECTOMY    . AORTIC VALVE REPLACEMENT N/A 08/23/2016   Procedure: AORTIC VALVE REPLACEMENT (AVR) - using partial Upper Sternotomy- 15m Edwards Intuity Aortic Valve used;  Surgeon: CRexene Alberts MD;  [facility]  Service: Open Heart Surgery;  Laterality: N/A;  . BLADDER SUSPENSION  2007  . BREAST ENHANCEMENT SURGERY  1980  . CARPAL TUNNEL RELEASE Bilateral   . COLONOSCOPY    . DORSAL COMPARTMENT RELEASE Right  09/15/2014   Procedure: RIGHT WRIST DEQUERVAINS RELEASE ;  Surgeon: DKathryne Hitch MD;  [place] [facility]  Service: Orthopedics;  Laterality: Right;  . ESOPHAGOGASTRODUODENOSCOPY    . EYE SURGERY     cataract surgery  . KNEE ARTHROSCOPY  04/15/2012   Procedure: ARTHROSCOPY KNEE;  Surgeon: DNinetta Lights MD;  [place] [facility]  Service: Orthopedics;  Laterality: Right;  . LAPAROSCOPIC CHOLECYSTECTOMY    . LASIK    . OOPHORECTOMY    . PALATE TO GINGIVA GRAFT  2017  . RIGHT/LEFT HEART CATH AND CORONARY ANGIOGRAPHY N/A 08/02/2016   Procedure: Right/Left Heart Cath and Coronary Angiography;  Surgeon: MSherren Mocha MD;  [facility];  Service: Cardiovascular;  Laterality: N/A;  . ROTATOR CUFF REPAIR Left   . TEE WITHOUT CARDIOVERSION N/A 08/23/2016   Procedure: TRANSESOPHAGEAL ECHOCARDIOGRAM (TEE);  Surgeon: CRexene Alberts MD;  [place] [facility]  Service: Open Heart Surgery;  Laterality: N/A;  . TOTAL KNEE ARTHROPLASTY  04/15/2012   Procedure: TOTAL KNEE ARTHROPLASTY;  Surgeon: DNinetta Lights MD;  [place] [facility]  Service: Orthopedics;  Laterality: Right;  . TRIGGER FINGER RELEASE Right 04/20/2015   Procedure: RIGHT TRIGGER FINGER RELEASE (TENDON SHEALTH INCISION) ;  Surgeon: DNinetta Lights MD;  [facility]  Service: Orthopedics;  Laterality: Right;    Social History   Socioeconomic History  . Marital status: Married    Spouse name: [name]  . Number of children: 1  . Years of education: Not on file  . Highest education level: Not on file  Occupational History  . Occupation: Retired, Curator: RETIRED  Tobacco Use  . Smoking status: Never Smoker  . Smokeless tobacco: Never Used  Vaping Use  . Vaping Use: Never used  Substance and Sexual Activity  . Alcohol use: No  . Drug use: No  . Sexual activity: Yes  Other Topics Concern  . Not on file  Social History Narrative   She lives with husband  (1978) and two dogs. Step-son [name] (1971-psychiatrist in [place]). No grandkids. 2 dogs-sheltie/collie mix and border collie mix      Highest level of education:  Master in education   She is retired Animal nutritionist x 32 years.      Hobbies: time with dogs   Right handed   One story home         Social Determinants of Health   Financial Resource Strain: Low Risk   . Difficulty of Paying Living Expenses: Not hard at all  Food Insecurity: No Food Insecurity  . Worried About Charity fundraiser in the Last Year: Never true  . Ran Out of Food in the Last Year: Never true  Transportation Needs: No Transportation Needs  . Lack of Transportation (Medical): No  . Lack of Transportation (Non-Medical): No  Physical Activity: Insufficiently Active  . Days of Exercise per Week: 3 days  . Minutes of Exercise per Session: 20 min  Stress: No Stress Concern Present  . Feeling of Stress : Not at all  Social Connections: Moderately Isolated  . Frequency of Communication with Friends and Family: More than three times a week  . Frequency of Social Gatherings with Friends and Family: Once a week  . Attends Religious Services: Never  . Active Member of Clubs or Organizations: No  . Attends Archivist Meetings: Never  . Marital Status: Married  Human resources officer Violence: Not At Risk  . Fear of Current or Ex-Partner: No  . Emotionally Abused: No  . Physically Abused: No  . Sexually Abused: No    Current Outpatient Medications on File Prior to Visit  Medication Sig Dispense Refill  . atorvastatin (LIPITOR) 40 MG tablet TAKE 1 TABLET (40 MG TOTAL) BY MOUTH DAILY AT 6 PM. 90 tablet 3  . Biotin 5000 MCG TABS Take 5,000 mcg by mouth daily.     . blood glucose meter kit and supplies KIT Dispense based on patient and insurance preference. Use up to four times daily as directed. Dx: E11.9 1 each 0  . calcium-vitamin D (OSCAL WITH D) 500-200 MG-UNIT per tablet Take 2 tablets by mouth  daily.     . Cholecalciferol (VITAMIN D) 125 MCG (5000 UT) CAPS Take 5,000 Units by mouth daily.    . cloNIDine (CATAPRES) 0.1 MG tablet Take 1 tablet (0.1 mg total) by mouth at bedtime. 90 tablet 2  . clopidogrel (PLAVIX) 75 MG tablet TAKE 1 TABLET BY MOUTH EVERY DAY 90 tablet 3  . demeclocycline (DECLOMYCIN) 300 MG tablet Take 1 tablet (300 mg total) by mouth 2 (two) times daily. 180 tablet 3  . fluticasone (FLONASE) 50 MCG/ACT nasal spray Place 2 sprays into both nostrils daily.     [name] glucose blood  test strip Use to test blood sugars daily. Dx: E11.9 100 each 12  . ipratropium (ATROVENT) 0.03 % nasal spray 2 SPRAYS IN EACH NOSTRIL AS NEEDED THREE TIMES AS NEEDED    . loratadine (CLARITIN) 10 MG tablet Take 10 mg by mouth at bedtime.     . Melatonin 5 MG TABS Take by mouth.     . metFORMIN (GLUCOPHAGE) 500 MG tablet TAKE 1 TABLET BY MOUTH WITH BREAKFAST AND 1/2 TO 1 TABLET WITH DINNER 180 tablet 1  . metoCLOPramide (REGLAN) 10 MG tablet TAKE 1/2 TABLET BY MOUTH 4 TIMES A DAY 60 tablet 3  . Multiple Vitamin (MULTIVITAMIN WITH MINERALS) TABS tablet Take 1 tablet by mouth daily.    . naproxen sodium (ALEVE) 220 MG tablet Take 220 mg by mouth.    . OMEPRAZOLE MAGNESIUM PO Take 40 mg by mouth daily with breakfast.     . ondansetron (ZOFRAN ODT) 4 MG disintegrating tablet Take 1 tablet (4 mg total) by mouth every 8 (eight) hours as needed. 10 tablet 0  . Probiotic Product (ALIGN) 4 MG CAPS Take 4 mg by mouth daily.     . psyllium (METAMUCIL SMOOTH TEXTURE) 28 % packet Take 1 packet by mouth daily before breakfast.    . sodium chloride 1 g tablet Take 6 g by mouth daily.    [name] venlafaxine XR (EFFEXOR-XR) 75 MG 24 hr capsule TAKE 1 CAPSULE (75 MG TOTAL) BY MOUTH DAILY WITH BREAKFAST. 90 capsule 2   No current facility-administered medications on file prior to visit.    Allergies  Allergen Reactions  . Augmentin [Amoxicillin-Pot Clavulanate] Rash    rash  . Erythromycin Nausea Only  .  Penicillins Itching and Rash    Has patient had a PCN reaction causing immediate rash, facial/tongue/throat swelling, SOB or lightheadedness with hypotension: No Has patient had a PCN reaction causing severe rash involving mucus membranes or skin necrosis: No Has patient had a PCN reaction that required hospitalization: No Has patient had a PCN reaction occurring within the last 10 years: No  If all of the above answers are "NO", then may proceed with Cephalosporin use.     Family History  Problem Relation Age of Onset  . COPD Mother   . Colon polyps Mother   . Irritable bowel syndrome Mother   . Anxiety disorder Mother   . Heart disease Father   . Alcohol abuse Father   . Breast cancer Maternal Grandmother   . Gallbladder disease Maternal Grandmother   . Colon polyps Maternal Aunt   . Diabetes Paternal Uncle        several uncles  . Other Neg Hx        hyponatremia  . Colon cancer Neg Hx   . Esophageal cancer Neg Hx   . Rectal cancer Neg Hx   . Stomach cancer Neg Hx     BP (!) 180/70 (BP Location: Right Arm, Patient Position: Sitting, Cuff Size: Large)   Pulse 78   Ht 5' 6"  (1.676 m)   Wt 209 lb 3.2 oz (94.9 kg)   LMP  (LMP Unknown)   SpO2 98%   BMI 33.77 kg/m    Review of Systems Denies n/v.     Objective:   Physical Exam VITAL SIGNS:  See vs page GENERAL: no distress EXT: trace bilat leg edema.     Lab Results  Component Value Date   ALT 19 08/15/2020   AST 33 08/15/2020   ALKPHOS 149 (H)  08/15/2020   BILITOT 0.5 08/15/2020   Lab Results  Component Value Date   CREATININE 0.65 08/15/2020   BUN 23 08/15/2020   NA 132 (L) 08/15/2020   K 4.6 08/15/2020   CL 97 08/15/2020   CO2 28 08/15/2020       Assessment & Plan:  Hyperphosphatasia, stable, uncertain etiology and prognosis Hyponatremia, persistent HTN: we can't increase the NaCl, at least for now.  Please continue the same for now.     Patient Instructions  Your blood pressure is high  today.  Please see your primary care provider soon, to have it rechecked We'll continue to follow the alkaline phosphatase. Blood tests are requested for you today.  We'll let you know about the results.  Please come back for a follow-up appointment in 6 months.

## 2020-08-16 LAB — PTH, INTACT AND CALCIUM
Calcium: 9.7 mg/dL (ref 8.6–10.4)
PTH: 22 pg/mL (ref 14–64)

## 2020-08-17 ENCOUNTER — Ambulatory Visit (INDEPENDENT_AMBULATORY_CARE_PROVIDER_SITE_OTHER): Payer: Self-pay | Admitting: Family Medicine

## 2020-08-17 DIAGNOSIS — M545 Low back pain, unspecified: Secondary | ICD-10-CM | POA: Diagnosis not present

## 2020-08-17 DIAGNOSIS — M7062 Trochanteric bursitis, left hip: Secondary | ICD-10-CM | POA: Diagnosis not present

## 2020-08-18 DIAGNOSIS — M7062 Trochanteric bursitis, left hip: Secondary | ICD-10-CM | POA: Diagnosis not present

## 2020-08-21 ENCOUNTER — Other Ambulatory Visit: Payer: Self-pay

## 2020-08-21 ENCOUNTER — Ambulatory Visit (HOSPITAL_COMMUNITY): Payer: Medicare PPO | Attending: Cardiology

## 2020-08-21 DIAGNOSIS — Z953 Presence of xenogenic heart valve: Secondary | ICD-10-CM

## 2020-08-21 DIAGNOSIS — I351 Nonrheumatic aortic (valve) insufficiency: Secondary | ICD-10-CM | POA: Diagnosis not present

## 2020-08-21 LAB — ECHOCARDIOGRAM COMPLETE
AR max vel: 2.03 cm2
AV Area VTI: 1.93 cm2
AV Area mean vel: 1.77 cm2
AV Mean grad: 14 mmHg
AV Peak grad: 25.8 mmHg
Ao pk vel: 2.54 m/s
Area-P 1/2: 2.03 cm2
P 1/2 time: 406 msec
S' Lateral: 2.6 cm

## 2020-08-23 ENCOUNTER — Telehealth: Payer: Self-pay | Admitting: Cardiology

## 2020-08-23 NOTE — Telephone Encounter (Signed)
Informed the pt that her echo has not been resulted by Dr. Meda Coffee yet, but is in her basket to review and advise on.  Informed the pt that she has a preliminary read on her echo, but Dr. Meda Coffee still has to put her final interpretation on the study.  Informed the pt that once Dr. Meda Coffee reviews her echo, I will call her shortly thereafter to endorse these results.  Informed the pt that we will see her as planned in clinic for next Wednesday 3/9.  Pt verbalized understanding and agrees with this plan. Pt appreciative for all the assistance provided.  Will send this message to Dr. Meda Coffee as an Juluis Rainier.

## 2020-08-23 NOTE — Telephone Encounter (Signed)
Patient calling to go over Echo results from 08/21/20. The patient has not heard anything yet and is starting to get nervous. Please call

## 2020-08-24 ENCOUNTER — Ambulatory Visit (INDEPENDENT_AMBULATORY_CARE_PROVIDER_SITE_OTHER): Payer: Medicare PPO | Admitting: Family Medicine

## 2020-08-24 ENCOUNTER — Encounter (INDEPENDENT_AMBULATORY_CARE_PROVIDER_SITE_OTHER): Payer: Self-pay | Admitting: Family Medicine

## 2020-08-24 ENCOUNTER — Other Ambulatory Visit: Payer: Self-pay

## 2020-08-24 VITALS — BP 166/66 | HR 59 | Temp 98.6°F | Ht 66.0 in | Wt 204.0 lb

## 2020-08-24 DIAGNOSIS — Z6832 Body mass index (BMI) 32.0-32.9, adult: Secondary | ICD-10-CM | POA: Diagnosis not present

## 2020-08-24 DIAGNOSIS — E1159 Type 2 diabetes mellitus with other circulatory complications: Secondary | ICD-10-CM

## 2020-08-24 DIAGNOSIS — E669 Obesity, unspecified: Secondary | ICD-10-CM

## 2020-08-24 DIAGNOSIS — I152 Hypertension secondary to endocrine disorders: Secondary | ICD-10-CM | POA: Diagnosis not present

## 2020-08-28 ENCOUNTER — Encounter (INDEPENDENT_AMBULATORY_CARE_PROVIDER_SITE_OTHER): Payer: Self-pay | Admitting: Family Medicine

## 2020-08-28 NOTE — Progress Notes (Signed)
Chief Complaint:   OBESITY [name] is here to discuss her progress with her obesity treatment plan along with follow-up of her obesity related diagnoses. [name] is on the Category 4 Plan and keeping a food journal and adhering to recommended goals of 1500-1600 calories and 115 g protein and states she is following her eating plan approximately 70% of the time. [name] states she is walking 15 minutes 3 times per week.  Today's visit was #: 46 Starting weight: 220 lbs Starting date: 01/19/2018 Today's weight: 204 lbs Today's date: 08/24/2020 Total lbs lost to date: 16 lbs Total lbs lost since last in-office visit: 0  Interim History: [name] is not journaling as she should do due to left hip pain. She had a recent steroid injection, which has made her hungrier. She is eating protein at all meals. She admits to snacking on simple carbs at times.  Subjective:   1. Hypertension associated with diabetes ([place]) [name]'s BP medication was changed to Catapres by cardiology a few months ago. Her BP's have been elevated. She denies chest pain or SOB. She is only on catapres and  has a follow up with cardiology March 9. She is S/P aortic valve replacement in 2018.  Assessment/Plan:   1. Hypertension associated with diabetes ([place]) [name] is working on healthy weight loss and exercise to improve blood pressure control.  Follow up with cardiology for medication management. .  2. Class 1 obesity with serious comorbidity and body mass index (BMI) of 32.0 to 32.9 in adult, unspecified obesity type [name] is currently in the action stage of change. As such, her goal is to continue with weight loss efforts. She has agreed to the Category 4 Plan and keeping a food journal and adhering to recommended goals of 1500-1600 calories and 115 g protein.   Exercise goals: As is  Behavioral modification strategies: decreasing simple carbohydrates.  [name] has agreed to follow-up  with our clinic in 2-3 weeks.  Objective:   Blood pressure (!) 166/66, pulse (!) 59, temperature 98.6 F (37 C), height 5\' 6"  (1.676 m), weight 204 lb (92.5 kg), SpO2 99 %. Body mass index is 32.93 kg/m.  General: Cooperative, alert, well developed, in no acute distress. HEENT: Conjunctivae and lids unremarkable. Cardiovascular: Regular rhythm.  Lungs: Normal work of breathing. Neurologic: No focal deficits.   Lab Results  Component Value Date   CREATININE 0.65 08/15/2020   BUN 23 08/15/2020   NA 132 (L) 08/15/2020   K 4.6 08/15/2020   CL 97 08/15/2020   CO2 28 08/15/2020   Lab Results  Component Value Date   ALT 19 08/15/2020   AST 33 08/15/2020   ALKPHOS 149 (H) 08/15/2020   BILITOT 0.5 08/15/2020   Lab Results  Component Value Date   HGBA1C 5.7 (H) 04/20/2020   HGBA1C 5.9 (A) 12/09/2019   HGBA1C 6.1 06/08/2019   HGBA1C 5.7 (A) 05/24/2019   HGBA1C 5.7 (A) 01/05/2019   Lab Results  Component Value Date   INSULIN 13.1 04/20/2020   INSULIN 12.1 01/19/2018   Lab Results  Component Value Date   TSH 3.38 06/04/2018   Lab Results  Component Value Date   CHOL 141 04/20/2020   HDL 65 04/20/2020   LDLCALC 63 04/20/2020   LDLDIRECT 58.0 11/25/2017   TRIG 66 04/20/2020   CHOLHDL 2.2 04/20/2020   Lab Results  Component Value Date   WBC 10.9 (H) 06/09/2020   HGB 12.9 06/09/2020   HCT 39.0 06/09/2020  MCV 87.6 06/09/2020   PLT 338 06/09/2020   No results found for: IRON, TIBC, FERRITIN  Obesity Behavioral Intervention:   Approximately 15 minutes were spent on the discussion below.  ASK: We discussed the diagnosis of obesity with [name] today and [name] agreed to give [name] permission to discuss obesity behavioral modification therapy today.  ASSESS: Xia has the diagnosis of obesity and her BMI today is 33.0. Derriona is in the action stage of change.   ADVISE: [name] was educated on the multiple health risks of obesity as well as the  benefit of weight loss to improve her health. She was advised of the need for long term treatment and the importance of lifestyle modifications to improve her current health and to decrease her risk of future health problems.  AGREE: Multiple dietary modification options and treatment options were discussed and [name] agreed to follow the recommendations documented in the above note.  ARRANGE: [name] was educated on the importance of frequent visits to treat obesity as outlined per CMS and USPSTF guidelines and agreed to schedule her next follow up appointment today.  Attestation Statements:   Reviewed by clinician on day of visit: allergies, medications, problem list, medical history, surgical history, family history, social history, and previous encounter notes.  Coral Ceo, am acting as Location manager for [employer], [place].  I have reviewed the above documentation for accuracy and completeness, and I agree with the above. -  Georgianne Fick, FNP

## 2020-08-29 NOTE — Telephone Encounter (Signed)
-----   Message from Dorothy Spark, MD sent at 08/29/2020  8:20 AM EST ----- Stable normal LVEF and mild to moderate paravalvular leak

## 2020-08-29 NOTE — Telephone Encounter (Signed)
The patient has been notified of the result and verbalized understanding.  All questions (if any) were answered. Nuala Alpha, LPN 12/26/7320 56:72 PM   Informed the pt that we will see her as planned in the office tomorrow 3/9 with Dr. Meda Coffee. Pt verbalized understanding and agrees with this plan.

## 2020-08-29 NOTE — Telephone Encounter (Signed)
Left the pt a message to call the office back to endorse echo results per Dr. Meda Coffee.

## 2020-08-29 NOTE — Telephone Encounter (Signed)
   Pt is returning call, she said before 1 pm to call her on her home# and after 1 pm to call her on her cell phone #

## 2020-08-30 ENCOUNTER — Other Ambulatory Visit: Payer: Self-pay

## 2020-08-30 ENCOUNTER — Ambulatory Visit: Payer: Medicare PPO | Admitting: Cardiology

## 2020-08-30 ENCOUNTER — Encounter: Payer: Self-pay | Admitting: Cardiology

## 2020-08-30 VITALS — BP 148/76 | HR 65 | Ht 66.0 in | Wt 206.6 lb

## 2020-08-30 DIAGNOSIS — E782 Mixed hyperlipidemia: Secondary | ICD-10-CM | POA: Diagnosis not present

## 2020-08-30 DIAGNOSIS — Z953 Presence of xenogenic heart valve: Secondary | ICD-10-CM

## 2020-08-30 DIAGNOSIS — I10 Essential (primary) hypertension: Secondary | ICD-10-CM

## 2020-08-30 DIAGNOSIS — T8203XA Leakage of heart valve prosthesis, initial encounter: Secondary | ICD-10-CM

## 2020-08-30 MED ORDER — LOSARTAN POTASSIUM 50 MG PO TABS: 50.0000 mg | ORAL_TABLET | Freq: Every day | ORAL | 1 refills | Status: DC

## 2020-08-30 NOTE — Patient Instructions (Signed)
Medication Instructions:   START TAKING LOSARTAN 50 MG BY MOUTH DAILY  *If you need a refill on your cardiac medications before your next appointment, please call your pharmacy*   Testing/Procedures:  Your physician has requested that you have an echocardiogram. Echocardiography is a painless test that uses sound waves to create images of your heart. It provides your doctor with information about the size and shape of your heart and how well your heart's chambers and valves are working. This procedure takes approximately one hour. There are no restrictions for this procedure. ECHO NEEDS TO BE SCHEDULED FOR ONE YEAR OUT PER DR. Vance Gather WILL BE PLACED UNDER DR. Johney Frame, BEING PATIENT WILL BE ESTABLISHING CARE WITH HER.    Follow-Up: At [facility], you and your health needs are our priority.  As part of our continuing mission to provide you with exceptional heart care, we have created designated Provider Care Teams.  These Care Teams include your primary Cardiologist (physician) and Advanced Practice Providers (APPs -  Physician Assistants and Nurse Practitioners) who all work together to provide you with the care you need, when you need it.  We recommend signing up for the patient portal called "MyChart".  Sign up information is provided on this After Visit Summary.  MyChart is used to connect with patients for Virtual Visits (Telemedicine).  Patients are able to view lab/test results, encounter notes, upcoming appointments, etc.  Non-urgent messages can be sent to your provider as well.   To learn more about what you can do with MyChart, go to [email or web address].    Your next appointment:   6 month(s)  The format for your next appointment:   In Person  Provider:   Gwyndolyn Kaufman, MD

## 2020-08-30 NOTE — Progress Notes (Signed)
Cardiology Office Note:    Date:  08/30/2020   ID:  [name], DOB [birth year], MRN [id number]  PCP:  Hayley Olp, MD  Cardiologist:  Dr [name]  Referring MD: Hayley Olp, MD   Reason for visit: 6 months follow-up  History of Present Illness:    [name] is a 70 y.o. female with a hx of Diabetes, hyperlipidemia, OSA on CPAP, and aortic valve replacement with bioprosthetic bovine pericardial tissue valve 08/23/16. Patient had only minor nonobstructive CAD on heart catheterization 08/02/16 done in preparation for AVR. Also right heart cath showed normal right heart hemodynamics. Echocardiogram showed normal EF of 60-65% and grade 1 diastolic dysfunction.  The patient was very motivated post surgery, underwent complete rehab program and dietary program and has lost 30 pounds.   The patient is coming after 6 months and states that she has been doing great, she denies any lower extremity edema orthopnea proximal nocturnal dyspnea.  She is not limited in activities of daily living other than secondary to her hip bursitis for which she received steroid injections.  On multiple blood pressure measurements she has noticed that her blood pressure has been elevated in the 140s.  She is compliant with her meds and low-sodium diet.  He denies any palpitations and no syncope.  Occasional balance problem but no falls.  Past Medical History:  Diagnosis Date  . Allergy   . Anxiety   . Arthritis    back & knee  . Asthma     mild per pt shows up with resp illness  . Colon polyps   . Constipation   . Diabetes mellitus without complication ([place])   . Dyspnea   . Gallstones   . Gastric polyps   . Gastroparesis   . GERD (gastroesophageal reflux disease)   . Headache    sinus headaches and migraines at times  . Heart murmur   . History of migraine headaches   . HTN (hypertension)   . Hyperlipidemia   . IBS (irritable bowel syndrome)   . Joint pain   . Lumbar disc disease    . S/P aortic valve replacement with bioprosthetic valve 08/23/2016   25 mm Edwards Intuity rapid-deployment bovine pericardial tissue valve via partial upper mini sternotomy  . Sleep apnea    CPAP  . TIA (transient ischemic attack)     Past Surgical History:  Procedure Laterality Date  . ABDOMINAL HYSTERECTOMY    . AORTIC VALVE REPLACEMENT N/A 08/23/2016   Procedure: AORTIC VALVE REPLACEMENT (AVR) - using partial Upper Sternotomy- 65m Edwards Intuity Aortic Valve used;  Surgeon: CRexene Alberts MD;  [place] [facility]  Service: Open Heart Surgery;  Laterality: N/A;  . BLADDER SUSPENSION  2007  . BREAST ENHANCEMENT SURGERY  1980  . CARPAL TUNNEL RELEASE Bilateral   . COLONOSCOPY    . DORSAL COMPARTMENT RELEASE Right 09/15/2014   Procedure: RIGHT WRIST DEQUERVAINS RELEASE ;  Surgeon: DKathryne Hitch MD;  [place] [facility]  Service: Orthopedics;  Laterality: Right;  . ESOPHAGOGASTRODUODENOSCOPY    . EYE SURGERY     cataract surgery  . KNEE ARTHROSCOPY  04/15/2012   Procedure: ARTHROSCOPY KNEE;  Surgeon: DNinetta Lights MD;  [place] [facility]  Service: Orthopedics;  Laterality: Right;  . LAPAROSCOPIC CHOLECYSTECTOMY    . LASIK    . OOPHORECTOMY    . PALATE TO GINGIVA GRAFT  2017  . RIGHT/LEFT HEART CATH AND CORONARY ANGIOGRAPHY N/A 08/02/2016  Procedure: Right/Left Heart Cath and Coronary Angiography;  Surgeon: Sherren Mocha, MD;  Location: [name];  Service: Cardiovascular;  Laterality: N/A;  . ROTATOR CUFF REPAIR Left   . TEE WITHOUT CARDIOVERSION N/A 08/23/2016   Procedure: TRANSESOPHAGEAL ECHOCARDIOGRAM (TEE);  Surgeon: Rexene Alberts, MD;  Location: [facility];  Service: Open Heart Surgery;  Laterality: N/A;  . TOTAL KNEE ARTHROPLASTY  04/15/2012   Procedure: TOTAL KNEE ARTHROPLASTY;  Surgeon: [name], MD;  Location: [facility];  Service: Orthopedics;  Laterality: Right;  . TRIGGER FINGER RELEASE Right 04/20/2015   Procedure: RIGHT TRIGGER FINGER RELEASE  (TENDON SHEALTH INCISION) ;  Surgeon: [name], MD;  Location: [place] [facility];  Service: Orthopedics;  Laterality: Right;    Current Medications: Current Meds  Medication Sig  . atorvastatin (LIPITOR) 40 MG tablet TAKE 1 TABLET (40 MG TOTAL) BY MOUTH DAILY AT 6 PM.  . Biotin 5000 MCG TABS Take 5,000 mcg by mouth daily.   . blood glucose meter kit and supplies KIT Dispense based on patient and insurance preference. Use up to four times daily as directed. Dx: E11.9  . calcium-vitamin D (OSCAL WITH D) 500-200 MG-UNIT per tablet Take 2 tablets by mouth daily.   . Cholecalciferol (VITAMIN D) 125 MCG (5000 UT) CAPS Take 5,000 Units by mouth daily.  . cloNIDine (CATAPRES) 0.1 MG tablet Take 1 tablet (0.1 mg total) by mouth at bedtime.  . clopidogrel (PLAVIX) 75 MG tablet TAKE 1 TABLET BY MOUTH EVERY DAY  . demeclocycline (DECLOMYCIN) 300 MG tablet Take 1 tablet (300 mg total) by mouth 2 (two) times daily.  . fluticasone (FLONASE) 50 MCG/ACT nasal spray Place 2 sprays into both nostrils daily.   [name] glucose blood test strip Use to test blood sugars daily. Dx: E11.9  . ipratropium (ATROVENT) 0.03 % nasal spray 2 SPRAYS IN EACH NOSTRIL AS NEEDED THREE TIMES AS NEEDED  . loratadine (CLARITIN) 10 MG tablet Take 10 mg by mouth at bedtime.   [name] losartan (COZAAR) 50 MG tablet Take 1 tablet (50 mg total) by mouth daily.  . Melatonin 5 MG TABS Take by mouth.   . metFORMIN (GLUCOPHAGE) 500 MG tablet Take 500 mg by mouth daily with breakfast.  . metoCLOPramide (REGLAN) 10 MG tablet TAKE 1/2 TABLET BY MOUTH 4 TIMES A DAY  . Multiple Vitamin (MULTIVITAMIN WITH MINERALS) TABS tablet Take 1 tablet by mouth daily.  . naproxen sodium (ALEVE) 220 MG tablet Take 220 mg by mouth.  . OMEPRAZOLE MAGNESIUM PO Take 40 mg by mouth daily with breakfast.   . ondansetron (ZOFRAN ODT) 4 MG disintegrating tablet Take 1 tablet (4 mg total) by mouth every 8 (eight) hours as needed.  . Probiotic Product (ALIGN) 4 MG  CAPS Take 4 mg by mouth daily.   . psyllium (METAMUCIL SMOOTH TEXTURE) 28 % packet Take 1 packet by mouth daily before breakfast.  . sodium chloride 1 g tablet Take 6 g by mouth daily.  [name] venlafaxine XR (EFFEXOR-XR) 75 MG 24 hr capsule TAKE 1 CAPSULE (75 MG TOTAL) BY MOUTH DAILY WITH BREAKFAST.     Allergies:   Augmentin [amoxicillin-pot clavulanate], Erythromycin, and Penicillins   Social History   Socioeconomic History  . Marital status: Married    Spouse name: [name]  . Number of children: 1  . Years of education: Not on file  . Highest education level: Not on file  Occupational History  . Occupation: Retired, Advice worker  Employer: RETIRED  Tobacco Use  . Smoking status: Never Smoker  . Smokeless tobacco: Never Used  Vaping Use  . Vaping Use: Never used  Substance and Sexual Activity  . Alcohol use: No  . Drug use: No  . Sexual activity: Yes  Other Topics Concern  . Not on file  Social History Narrative   She lives with husband (1978) and two dogs. Step-son [name] (1971-psychiatrist in [place]). No grandkids. 2 dogs-sheltie/collie mix and border collie mix      Highest level of education:  Master in education   She is retired Animal nutritionist x 32 years.      Hobbies: time with dogs   Right handed   One story home         Social Determinants of Health   Financial Resource Strain: Low Risk   . Difficulty of Paying Living Expenses: Not hard at all  Food Insecurity: No Food Insecurity  . Worried About Charity fundraiser in the Last Year: Never true  . Ran Out of Food in the Last Year: Never true  Transportation Needs: No Transportation Needs  . Lack of Transportation (Medical): No  . Lack of Transportation (Non-Medical): No  Physical Activity: Insufficiently Active  . Days of Exercise per Week: 3 days  . Minutes of Exercise per Session: 20 min  Stress: No Stress Concern Present  . Feeling of Stress : Not at all  Social Connections: Moderately  Isolated  . Frequency of Communication with Friends and Family: More than three times a week  . Frequency of Social Gatherings with Friends and Family: Once a week  . Attends Religious Services: Never  . Active Member of Clubs or Organizations: No  . Attends Archivist Meetings: Never  . Marital Status: Married     Family History: The patient's family history includes Alcohol abuse in her father; Anxiety disorder in her mother; Breast cancer in her maternal grandmother; COPD in her mother; Colon polyps in her maternal aunt and mother; Diabetes in her paternal uncle; Gallbladder disease in her maternal grandmother; Heart disease in her father; Irritable bowel syndrome in her mother. There is no history of Other, Colon cancer, Esophageal cancer, Rectal cancer, or Stomach cancer. ROS:   Please see the history of present illness.     All other systems reviewed and are negative.  EKGs/Labs/Other Studies Reviewed:    The following studies were reviewed today:  Right/Left Heart Cath and Coronary Angiography 08/02/16   1. Minor nonobstructive CAD 2. Normal right heart hemodynamics (including normal PCWP) 3. Known severe aortic stenosis    Intraoperative TEE 08/23/16  Left ventricle: Normal cavity size. Concentric hypertrophy of moderate severity. LV systolic function is normal with an EF of 60-65%. There are no obvious wall motion abnormalities.  Septum: No Patent Foramen Ovale present.  Left atrium: Patent foramen ovale not present.  Aortic valve: The valve is bicuspid. Severe valve thickening present. Severe valve calcification present. Moderately decreased leaflet separation. Severe stenosis. No regurgitation. No AV vegetation. No evidence of papillary fibroelastoma.  Aorta: The ascending aorta is mildly dilated.  Mitral valve: No leaflet thickening and calcification present. Mild mitral annular calcification.  Right ventricle: Normal cavity size, wall thickness and ejection  fraction. Tricuspid valve: Trace regurgitation. The tricuspid valve regurgitation jet is central.   TTE: 08/26/2017   Left ventricle: The cavity size was mildly dilated. Wall   thickness was increased in a pattern of mild LVH. Systolic   function was  normal. The estimated ejection fraction was in the   range of 60% to 65%. Left ventricular diastolic function   parameters were normal. - Aortic valve: patient had a Edwards 25 mm Intuity Elite rapid   deployment bovine pericardial valve placed 09/12/16. 05/01/17 mean   gradient was 6 mmHg peak gradient 12 mmHg. Gradients have   increased now 17 mmHg and 34 mmHg peak. There is a mild leak seen   through the basal stented portion of the valve appears at around   10:00 on BSA views - Mitral valve: There was mild regurgitation. - Atrial septum: No defect or patent foramen ovale was identified.  TTE12/21/2020   1. Left ventricular ejection fraction, by visual estimation, is 60 to  65%. The left ventricle has normal function. There is no left ventricular  hypertrophy.  2. Abnormal septal motion consistent with post-operative status.  3. The left ventricle has no regional wall motion abnormalities.  4. Global right ventricle has normal systolic function.The right  ventricular size is normal. No increase in right ventricular wall  thickness.  5. Left atrial size was mildly dilated.  6. Right atrial size was normal.  7. Bioprosthetic aortic valve with previously described mild perivalvular  leak. Normal prosthetic gradients.  8. The mitral valve is normal in structure. Trivial mitral valve  regurgitation. No evidence of mitral stenosis.  9. The tricuspid valve is normal in structure. Tricuspid valve  regurgitation is not demonstrated.  10. The pulmonic valve was normal in structure. Pulmonic valve  regurgitation is not visualized.  11. The inferior vena cava is normal in size with greater than 50%  respiratory variability, suggesting  right atrial pressure of 3 mmHg.  12. Aortic prosthesis gradients are lower, otherwise no change since  08/26/2017.  13. Prior images reviewed side by side.  EKG:  EKG is ordered today and personally reviewed, which shows sinus bradycardia 65bpm, LVH unchanged from prior.    Recent Labs: 06/09/2020: Hemoglobin 12.9; Platelets 338 08/15/2020: ALT 19; BUN 23; Creatinine, Ser 0.65; Potassium 4.6; Pro B Natriuretic peptide (BNP) 58.0; Sodium 132   Recent Lipid Panel    Component Value Date/Time   CHOL 141 04/20/2020 1036   TRIG 66 04/20/2020 1036   HDL 65 04/20/2020 1036   CHOLHDL 2.2 04/20/2020 1036   CHOLHDL 2 06/08/2019 0949   VLDL 13.0 06/08/2019 0949   LDLCALC 63 04/20/2020 1036   LDLDIRECT 58.0 11/25/2017 1156   Physical Exam:    VS:  BP (!) 148/76   Pulse 65   Ht _0  (1.676 m)   Wt 206 lb 9.6 oz (93.7 kg)   LMP  (LMP Unknown)   SpO2 99%   BMI 33.35 kg/m     Wt Readings from Last 3 Encounters:  08/30/20 206 lb 9.6 oz (93.7 kg)  08/24/20 204 lb (92.5 kg)  08/15/20 209 lb 3.2 oz (94.9 kg)    GEN: Well nourished, well developed in no acute distress HEENT: Normal NECK: No JVD; No carotid bruits LYMPHATICS: No lymphadenopathy CARDIAC: RRR, 3 out of 6 systolic and 2 out of 6 diastolic murmur, rubs, gallops RESPIRATORY:  Clear to auscultation without rales, wheezing or rhonchi  ABDOMEN: Soft, non-tender, non-distended MUSCULOSKELETAL:  No edema; No deformity  SKIN: Warm and dry NEUROLOGIC:  Alert and oriented x 3 PSYCHIATRIC:  Normal affect    ASSESSMENT:    1. S/P aortic valve replacement with bioprosthetic valve   2. Mixed hyperlipidemia   3. Essential hypertension   4.  Paravalvular leak of prosthetic heart valve, initial encounter      PLAN:    In order of problems listed above:  S/P AVR with bovine tissue valve 08/23/16 -The patient continues to exercise and has lost 30 pounds since surgery.   - Her most recent echocardiogram in March 2022 showed normal  transaortic gradients with mean 14 mmHg, stable mild to moderate paravalvular leak, she has 3 out of 6 diastolic murmur, I have personally reviewed her echocardiogram.  I would recommend that we check her echo annually and follow her symptoms closely, she is currently asymptomatic.  Chronic diastolic CHF -She is euvolemic, doing great with exercise and diet regimen.  Hyperlipidemia -All lipids at goal on atorvastatin.  Most recent HDL 65, LDL 63 and triglycerides 66.  Hemoglobin A1c 5.7%.  OSA -Using CPAP consistently every night.   Hypertension -Elevated, she is on clonidine 0.1 mg daily that helps her with her hot flashes, I will restart losartan 50 mg daily.  Medication Adjustments/Labs and Tests Ordered: Current medicines are reviewed at length with the patient today.  Concerns regarding medicines are outlined above. Labs and tests ordered and medication changes are outlined in the patient instructions below:  Patient Instructions  Medication Instructions:   START TAKING LOSARTAN 50 MG BY MOUTH DAILY  *If you need a refill on your cardiac medications before your next appointment, please call your pharmacy*   Testing/Procedures:  Your physician has requested that you have an echocardiogram. Echocardiography is a painless test that uses sound waves to create images of your heart. It provides your doctor with information about the size and shape of your heart and how well your heart's chambers and valves are working. This procedure takes approximately one hour. There are no restrictions for this procedure. ECHO NEEDS TO BE SCHEDULED FOR ONE YEAR OUT PER DR. Vance Gather WILL BE PLACED UNDER DR. Johney Frame, BEING PATIENT WILL BE ESTABLISHING CARE WITH HER.    Follow-Up: At [facility], you and your health needs are our priority.  As part of our continuing mission to provide you with exceptional heart care, we have created designated Provider Care Teams.  These Care Teams include your  primary Cardiologist (physician) and Advanced Practice Providers (APPs -  Physician Assistants and Nurse Practitioners) who all work together to provide you with the care you need, when you need it.  We recommend signing up for the patient portal called "MyChart".  Sign up information is provided on this After Visit Summary.  MyChart is used to connect with patients for Virtual Visits (Telemedicine).  Patients are able to view lab/test results, encounter notes, upcoming appointments, etc.  Non-urgent messages can be sent to your provider as well.   To learn more about what you can do with MyChart, go to [email or web address].    Your next appointment:   6 month(s)  The format for your next appointment:   In Person  Provider:   Gwyndolyn Kaufman, MD        Signed, Ena Dawley, MD  08/30/2020 10:37 AM    [place]

## 2020-09-05 ENCOUNTER — Other Ambulatory Visit: Payer: Self-pay | Admitting: Endocrinology

## 2020-09-05 DIAGNOSIS — E871 Hypo-osmolality and hyponatremia: Secondary | ICD-10-CM

## 2020-09-12 ENCOUNTER — Other Ambulatory Visit: Payer: Self-pay

## 2020-09-12 ENCOUNTER — Encounter (INDEPENDENT_AMBULATORY_CARE_PROVIDER_SITE_OTHER): Payer: Self-pay | Admitting: Physician Assistant

## 2020-09-12 ENCOUNTER — Ambulatory Visit (INDEPENDENT_AMBULATORY_CARE_PROVIDER_SITE_OTHER): Payer: Medicare PPO | Admitting: Physician Assistant

## 2020-09-12 VITALS — BP 190/62 | HR 66 | Temp 97.9°F | Ht 66.0 in | Wt 202.0 lb

## 2020-09-12 DIAGNOSIS — E119 Type 2 diabetes mellitus without complications: Secondary | ICD-10-CM

## 2020-09-12 DIAGNOSIS — I152 Hypertension secondary to endocrine disorders: Secondary | ICD-10-CM | POA: Diagnosis not present

## 2020-09-12 DIAGNOSIS — Z6833 Body mass index (BMI) 33.0-33.9, adult: Secondary | ICD-10-CM

## 2020-09-12 DIAGNOSIS — E669 Obesity, unspecified: Secondary | ICD-10-CM | POA: Diagnosis not present

## 2020-09-12 DIAGNOSIS — E1159 Type 2 diabetes mellitus with other circulatory complications: Secondary | ICD-10-CM | POA: Diagnosis not present

## 2020-09-14 ENCOUNTER — Telehealth: Payer: Self-pay | Admitting: Cardiology

## 2020-09-14 NOTE — Telephone Encounter (Signed)
Please increase losartan to 100 mg po daily, add HCTZ 25 mg po daily, have her do BP diary and send it to [name]. Follow low sodium diet

## 2020-09-14 NOTE — Telephone Encounter (Signed)
RN returned call to patient. She was wondering if Dr.Nelson had any recommendations regarding her BP when she went to [facility]. Patient does not have a recent blood pressure. Patient continues to take Cozaar 50mg  as prescribed. Patient denies headache, fatigue or blurry vision. RN advised I would sent a message to Dr. Meda Coffee to make her aware. Patient appreciative of phone call.

## 2020-09-14 NOTE — Telephone Encounter (Signed)
    Pt is calling to follow up hey mychart message she sent yesterday about her BP. She wanted to make sure Karlene Einstein got it and waiting for her to call her back

## 2020-09-14 NOTE — Progress Notes (Signed)
Chief Complaint:   OBESITY [name] is here to discuss her progress with her obesity treatment plan along with follow-up of her obesity related diagnoses. [name] is on the Category 4 Plan or keeping a food journal and adhering to recommended goals of 1500-1600 calories and 115 grams of protein daily and states she is following her eating plan approximately 85% of the time. [name] states she is walking and exercising for 15-20 minutes 3 times per week.  Today's visit was #: 18 Starting weight: 220 lbs Starting date: 01/19/2018 Today's weight: 202 lbs Today's date: 09/12/2020 Total lbs lost to date: 18 Total lbs lost since last in-office visit: 2  Interim History: Dezeray did very well with weight loss. She stopped eating cheese crackers, and she is asking her husband not to bring them in the house. She has not been journaling consistently.  Subjective:   1. Hypertension associated with diabetes ([place]) [name] started a new blood pressure medications prescribed by her Cardiologist 2 weeks ago. Her blood pressure is elevated today. She reports recent stress, and she denies headache or chest pain.   2. Controlled type 2 diabetes mellitus without complication, without long-term current use of insulin ([facility]) [name]'s blood sugars fasting range between 126 and 145. She is on metformin, and last A1c was 5.7.  Assessment/Plan:   1. Hypertension associated with diabetes [facility]) [name] will follow up with Cardiology. She is to check her blood pressure at home, and will contact her Cardiologist when she leaves here. She will continue working on healthy weight loss and exercise to improve blood pressure control. We will watch for signs of hypotension as she continues her lifestyle modifications.  2. Controlled type 2 diabetes mellitus without complication, without long-term current use of insulin ([facility]CC) Good blood sugar control is important to decrease the likelihood of  diabetic complications such as nephropathy, neuropathy, limb loss, blindness, coronary artery disease, and death. Intensive lifestyle modification including diet, exercise and weight loss are the first line of treatment for diabetes. [name] will continue her medications, meal plan, and exercise.  3. Class 1 obesity with serious comorbidity and body mass index (BMI) of 33.0 to 33.9 in adult, unspecified obesity type [name] is currently in the action stage of change. As such, her goal is to continue with weight loss efforts. She has agreed to keeping a food journal and adhering to recommended goals of 1600 calories and 100 grams of protein daily.   Exercise goals: As is.  Behavioral modification strategies: no skipping meals and meal planning and cooking strategies.  [name] has agreed to follow-up with our clinic in 2 weeks. She was informed of the importance of frequent follow-up visits to maximize her success with intensive lifestyle modifications for her multiple health conditions.   Objective:   Blood pressure (!) 190/62, pulse 66, temperature 97.9 F (36.6 C), height 5\' 6"  (1.676 m), weight 202 lb (91.6 kg), SpO2 99 %. Body mass index is 32.6 kg/m.  General: Cooperative, alert, well developed, in no acute distress. HEENT: Conjunctivae and lids unremarkable. Cardiovascular: Regular rhythm.  Lungs: Normal work of breathing. Neurologic: No focal deficits.   Lab Results  Component Value Date   CREATININE 0.65 08/15/2020   BUN 23 08/15/2020   NA 132 (L) 08/15/2020   K 4.6 08/15/2020   CL 97 08/15/2020   CO2 28 08/15/2020   Lab Results  Component Value Date   ALT 19 08/15/2020   AST 33 08/15/2020   ALKPHOS 149 (H) 08/15/2020  BILITOT 0.5 08/15/2020   Lab Results  Component Value Date   HGBA1C 5.7 (H) 04/20/2020   HGBA1C 5.9 (A) 12/09/2019   HGBA1C 6.1 06/08/2019   HGBA1C 5.7 (A) 05/24/2019   HGBA1C 5.7 (A) 01/05/2019   Lab Results  Component Value Date    INSULIN 13.1 04/20/2020   INSULIN 12.1 01/19/2018   Lab Results  Component Value Date   TSH 3.38 06/04/2018   Lab Results  Component Value Date   CHOL 141 04/20/2020   HDL 65 04/20/2020   LDLCALC 63 04/20/2020   LDLDIRECT 58.0 11/25/2017   TRIG 66 04/20/2020   CHOLHDL 2.2 04/20/2020   Lab Results  Component Value Date   WBC 10.9 (H) 06/09/2020   HGB 12.9 06/09/2020   HCT 39.0 06/09/2020   MCV 87.6 06/09/2020   PLT 338 06/09/2020   No results found for: IRON, TIBC, FERRITIN  Obesity Behavioral Intervention:   Approximately 15 minutes were spent on the discussion below.  ASK: We discussed the diagnosis of obesity with [name] today and [name] agreed to give [name] permission to discuss obesity behavioral modification therapy today.  ASSESS: [name] has the diagnosis of obesity and her BMI today is 32.62. [name] is in the action stage of change.   ADVISE: [name] was educated on the multiple health risks of obesity as well as the benefit of weight loss to improve her health. She was advised of the need for long term treatment and the importance of lifestyle modifications to improve her current health and to decrease her risk of future health problems.  AGREE: Multiple dietary modification options and treatment options were discussed and [name] agreed to follow the recommendations documented in the above note.  ARRANGE: [name] was educated on the importance of frequent visits to treat obesity as outlined per CMS and USPSTF guidelines and agreed to schedule her next follow up appointment today.  Attestation Statements:   Reviewed by clinician on day of visit: allergies, medications, problem list, medical history, surgical history, family history, social history, and previous encounter notes.   Wilhemena Durie, am acting as transcriptionist for [employer], PA-C.  I have reviewed the above documentation for accuracy and completeness, and I agree  with the above. [name], PA-C

## 2020-09-18 MED ORDER — HYDROCHLOROTHIAZIDE 25 MG PO TABS: 25.0000 mg | ORAL_TABLET | Freq: Every day | ORAL | 1 refills | Status: DC

## 2020-09-18 MED ORDER — LOSARTAN POTASSIUM 100 MG PO TABS: 100.0000 mg | ORAL_TABLET | Freq: Every day | ORAL | 1 refills | Status: DC

## 2020-09-18 NOTE — Telephone Encounter (Signed)
Spoke with the pt and informed her of med recommendations per Dr. Meda Coffee.  Advised her to increase her losartan to 100 mg po daily and start taking HCTZ 25 mg po daily, continue to monitor and log her BP/HR for the next week and log this, then send this to [name] via mychart, to further review and advise with med changes above.  Also advised the pt to maintain a low sodium diet.  Confirmed the pharmacy of choice with the pt.  Pt verbalized understanding and agrees with this plan.

## 2020-09-25 DIAGNOSIS — I10 Essential (primary) hypertension: Secondary | ICD-10-CM

## 2020-09-25 DIAGNOSIS — I152 Hypertension secondary to endocrine disorders: Secondary | ICD-10-CM

## 2020-09-25 DIAGNOSIS — Z953 Presence of xenogenic heart valve: Secondary | ICD-10-CM

## 2020-09-25 DIAGNOSIS — E1159 Type 2 diabetes mellitus with other circulatory complications: Secondary | ICD-10-CM

## 2020-09-25 DIAGNOSIS — Z79899 Other long term (current) drug therapy: Secondary | ICD-10-CM

## 2020-09-26 ENCOUNTER — Telehealth: Payer: Self-pay

## 2020-09-26 DIAGNOSIS — R921 Mammographic calcification found on diagnostic imaging of breast: Secondary | ICD-10-CM | POA: Diagnosis not present

## 2020-09-26 DIAGNOSIS — N6081 Other benign mammary dysplasias of right breast: Secondary | ICD-10-CM | POA: Diagnosis not present

## 2020-09-26 MED ORDER — AMLODIPINE BESYLATE 10 MG PO TABS: 10.0000 mg | ORAL_TABLET | Freq: Every day | ORAL | 1 refills | Status: DC

## 2020-09-26 NOTE — Telephone Encounter (Signed)
Hayley Bergeron, MD  You 3 minutes ago (1:27 PM)   HP  Let's start her on amlodipine 10mg  daily and refer to HTN clinic. Love your plan!   Message text      Endorsed to the pt via mychart message with recommendations as mentioned above by Dr. Johney Frame. Informed the pt that I will send the amlodipine 10 mg po daily to her pharmacy on file.  Also informed her that I will go ahead and place the referral to our BP clinic in the system and have our [facility] schedulers call her back and arrange this appt.  Advised the pt to message back or call with any additional questions concerning this.

## 2020-09-26 NOTE — Telephone Encounter (Signed)
September 14, 2020  Dorothy Spark, MD to Me     4:41 PM Note Please increase losartan to 100 mg po daily, add HCTZ 25 mg po daily, have her do BP diary and send it to [name]. Follow low sodium diet       Dr. Johney Frame, as mentioned above, Dr. Meda Coffee increased this pts losartan to 100 mg po daily and added HCTZ 25 mg po daily to her regimen for HTN.  We advised her to maintain low sodium diet and continue monitoring and logging her BP and send you some readings in a week.  In this mychart the pt sent her current readings on her med regimen. Please advise on what we should do.  We have been trying to work on getting her numbers down for quite sometime.  Maybe refer to HTN clinic? Please advise! Thanks!

## 2020-09-26 NOTE — Telephone Encounter (Signed)
lmom pt to cal back to get scheduled at the chst office of med management of bp

## 2020-09-26 NOTE — Telephone Encounter (Signed)
-----   Message from Ramond Dial, [place] sent at 09/26/2020  2:16 PM EDT ----- Regarding: FW: refer to BP Clinic per Johney Frame for med management Please schedule in 2 weeks (or our next available) thanks ----- Message ----- From: Nuala Alpha, LPN Sent: 06/29/1094   1:41 PM EDT To: Leeroy Bock, RPH-CPP, # Subject: refer to BP Clinic per Johney Frame for med man#  Dr. Johney Frame would like for this pt to be referred to BP clinic with you guys for further med management for her ongoing HTN, despite several med changes that have been made.  Referral is in the system and the pt is aware that you will call to arrange. Can you please call and schedule and shoot me the date?  Thanks for all you do, Karlene Einstein

## 2020-09-28 ENCOUNTER — Ambulatory Visit (INDEPENDENT_AMBULATORY_CARE_PROVIDER_SITE_OTHER): Payer: Medicare PPO | Admitting: Physician Assistant

## 2020-09-28 ENCOUNTER — Other Ambulatory Visit: Payer: Self-pay

## 2020-09-28 ENCOUNTER — Encounter (INDEPENDENT_AMBULATORY_CARE_PROVIDER_SITE_OTHER): Payer: Self-pay | Admitting: Physician Assistant

## 2020-09-28 VITALS — BP 158/65 | HR 63 | Temp 98.7°F | Ht 66.0 in | Wt 204.0 lb

## 2020-09-28 DIAGNOSIS — I10 Essential (primary) hypertension: Secondary | ICD-10-CM

## 2020-09-28 DIAGNOSIS — E669 Obesity, unspecified: Secondary | ICD-10-CM

## 2020-09-28 DIAGNOSIS — E1169 Type 2 diabetes mellitus with other specified complication: Secondary | ICD-10-CM

## 2020-09-28 DIAGNOSIS — E119 Type 2 diabetes mellitus without complications: Secondary | ICD-10-CM

## 2020-09-28 DIAGNOSIS — Z6836 Body mass index (BMI) 36.0-36.9, adult: Secondary | ICD-10-CM

## 2020-09-28 DIAGNOSIS — E1159 Type 2 diabetes mellitus with other circulatory complications: Secondary | ICD-10-CM

## 2020-09-28 NOTE — Telephone Encounter (Signed)
RE: refer to BP Clinic per [facility] for med management Received: Today Jerlyn Ly, LPN [birth year]

## 2020-09-28 NOTE — Telephone Encounter (Signed)
Called and spoke w/pt and got her scheduled for bp pharmd appt

## 2020-10-02 ENCOUNTER — Other Ambulatory Visit: Payer: Self-pay

## 2020-10-02 ENCOUNTER — Ambulatory Visit (INDEPENDENT_AMBULATORY_CARE_PROVIDER_SITE_OTHER): Payer: Medicare PPO | Admitting: Pharmacist

## 2020-10-02 DIAGNOSIS — I152 Hypertension secondary to endocrine disorders: Secondary | ICD-10-CM | POA: Diagnosis not present

## 2020-10-02 DIAGNOSIS — E1159 Type 2 diabetes mellitus with other circulatory complications: Secondary | ICD-10-CM | POA: Diagnosis not present

## 2020-10-02 DIAGNOSIS — Z79899 Other long term (current) drug therapy: Secondary | ICD-10-CM | POA: Diagnosis not present

## 2020-10-02 DIAGNOSIS — Z953 Presence of xenogenic heart valve: Secondary | ICD-10-CM

## 2020-10-02 DIAGNOSIS — I10 Essential (primary) hypertension: Secondary | ICD-10-CM | POA: Diagnosis not present

## 2020-10-02 NOTE — Patient Instructions (Addendum)
It was a please to meet you  Your blood pressure goal is <130/80  STOP taking hydrochlorothiazide  Start taking amlodipine 5mg  (1/2 tablet) once a day  Keep checking blood pressure at home daily  Try to increase walking as much as tolerated  Call me with any questions at [phone]  Resources: American Heart Association - InstantFinish.fi Go to the Healthy Living tab to get more information American Diabetes Association - www.diabetes.org You don't have to be diabetic - check out the Food and Fitness tab  DASH diet - https://wilson-eaton.com/ Health topics - or just search on their home page for DASH Quit for Life - www.cancer.org Follow the Stay Healthy tab to learn more about smoking cessation

## 2020-10-02 NOTE — Progress Notes (Signed)
Patient ID: [name]                 DOB: 1951-06-06                      MRN: [id number]     HPI: [name] is a 70 y.o. female referred by Dr. Johney Frame to HTN clinic. PMH is significant for diabetes, hyperlipidemia, OSA on CPAP, aortic valve replacement with bioprosthetic bovine pericardial tissue valve 08/23/16, HTN, obesity, non-obstructive CAD, diastolic CHF and TIA. She is currently following in the health weight and wellness clinic. On 09/12/20 her blood pressure was 190/62. Losartan was increased from 30m to 1063mand HCTZ 2526maily was added on 09/14/20. Blood pressures were still elevated after this change, therefore amlodipine 75m78mily was added on 09/26/20.  Patient presents to clinic today for initial HTN clinic visit. She states that she never picked up the amlodipine. Pharmacy never called her to tell her it was ready. Brings in home BP readings, mostly in the 150's. She states she has lost 34lb. She hasn't been able to exercise much lately due to bursitis. She walked about 15 min yesterday and she said the pain was less than the day before. Her goal for now in 15 min three days a week and to work up to 30 min 5 days a week. She does not watch her sodium intake because she has low sodium and is on salt tabs and demeclocycline by her endocrinologist.   States she has dizziness/lightheadedness only if she stands up too fast. Denies headaches, blurred vision, SOB or swelling. She brought in her home cuff (wrist cuff). Wrist cuff read 152/71, clinic manual cuff 160/68, clinic automatic 156/70.  Current HTN meds: clonidine 0.1mg 88mly at bedtime (for hot flashes), losartan 100mg 32my, HCTZ 25mg d24m Previously tried:  BP goal: <130/80  Family History: The patient's family history includes Alcohol abuse in her father; Anxiety disorder in her mother; Breast cancer in her maternal grandmother; COPD in her mother; Colon polyps in her maternal aunt and mother; Diabetes in her  paternal uncle; Gallbladder disease in her maternal grandmother; Heart disease in her father; Irritable bowel syndrome in her mother. There is no history of Other, Colon cancer, Esophageal cancer, Rectal cancer, or Stomach cancer.  Social History: No ETOH use, no tobacco use, no illicit drugs  Diet: takes salt tabs 16oz bottle of coke zero unsweet tea at lunch if she is out to lunch  Exercise: trying to walk 3 times a week (currently at 15 min)  Home BP readings: 155/75,155/91,148/92,156/105, 153/55,147/72,171/78,156/94,166/79  Wt Readings from Last 3 Encounters:  09/28/20 204 lb (92.5 kg)  09/12/20 202 lb (91.6 kg)  08/30/20 206 lb 9.6 oz (93.7 kg)   BP Readings from Last 3 Encounters:  09/28/20 (!) 158/65  09/12/20 (!) 190/62  08/30/20 (!) 148/76   Pulse Readings from Last 3 Encounters:  09/28/20 63  09/12/20 66  08/30/20 65    Renal function: CrCl cannot be calculated (Patient's most recent lab result is older than the maximum 21 days allowed.).  Past Medical History:  Diagnosis Date  . Allergy   . Anxiety   . Arthritis    back & knee  . Asthma     mild per pt shows up with resp illness  . Colon polyps   . Constipation   . Diabetes mellitus without complication (HCC)   Montmorencyyspnea   . Gallstones   . Gastric  polyps   . Gastroparesis   . GERD (gastroesophageal reflux disease)   . Headache    sinus headaches and migraines at times  . Heart murmur   . History of migraine headaches   . HTN (hypertension)   . Hyperlipidemia   . IBS (irritable bowel syndrome)   . Joint pain   . Lumbar disc disease   . S/P aortic valve replacement with bioprosthetic valve 08/23/2016   25 mm [employer] rapid-deployment bovine pericardial tissue valve via partial upper mini sternotomy  . Sleep apnea    CPAP  . TIA (transient ischemic attack)     Current Outpatient Medications on File Prior to Visit  Medication Sig Dispense Refill  . amLODipine (NORVASC) 10 MG tablet Take 1  tablet (10 mg total) by mouth daily. 90 tablet 1  . atorvastatin (LIPITOR) 40 MG tablet TAKE 1 TABLET (40 MG TOTAL) BY MOUTH DAILY AT 6 PM. 90 tablet 3  . Biotin 5000 MCG TABS Take 5,000 mcg by mouth daily.     . blood glucose meter kit and supplies KIT Dispense based on patient and insurance preference. Use up to four times daily as directed. Dx: E11.9 1 each 0  . calcium-vitamin D (OSCAL WITH D) 500-200 MG-UNIT per tablet Take 2 tablets by mouth daily.     . Cholecalciferol (VITAMIN D) 125 MCG (5000 UT) CAPS Take 5,000 Units by mouth daily.    . cloNIDine (CATAPRES) 0.1 MG tablet Take 1 tablet (0.1 mg total) by mouth at bedtime. 90 tablet 2  . clopidogrel (PLAVIX) 75 MG tablet TAKE 1 TABLET BY MOUTH EVERY DAY 90 tablet 3  . demeclocycline (DECLOMYCIN) 300 MG tablet TAKE 1 TABLET (300 MG TOTAL) BY MOUTH 2 (TWO) TIMES DAILY. 180 tablet 3  . fluticasone (FLONASE) 50 MCG/ACT nasal spray Place 2 sprays into both nostrils daily.     [name] glucose blood test strip Use to test blood sugars daily. Dx: E11.9 100 each 12  . hydrochlorothiazide (HYDRODIURIL) 25 MG tablet Take 1 tablet (25 mg total) by mouth daily. 90 tablet 1  . ipratropium (ATROVENT) 0.03 % nasal spray 2 SPRAYS IN EACH NOSTRIL AS NEEDED THREE TIMES AS NEEDED    . loratadine (CLARITIN) 10 MG tablet Take 10 mg by mouth at bedtime.     [name] losartan (COZAAR) 100 MG tablet Take 1 tablet (100 mg total) by mouth daily. 90 tablet 1  . Melatonin 5 MG TABS Take by mouth.     . metFORMIN (GLUCOPHAGE) 500 MG tablet Take 500 mg by mouth daily with breakfast.    . metoCLOPramide (REGLAN) 10 MG tablet TAKE 1/2 TABLET BY MOUTH 4 TIMES A DAY 60 tablet 3  . Multiple Vitamin (MULTIVITAMIN WITH MINERALS) TABS tablet Take 1 tablet by mouth daily.    . naproxen sodium (ALEVE) 220 MG tablet Take 220 mg by mouth.    . OMEPRAZOLE MAGNESIUM PO Take 40 mg by mouth daily with breakfast.     . ondansetron (ZOFRAN ODT) 4 MG disintegrating tablet Take 1 tablet (4 mg total)  by mouth every 8 (eight) hours as needed. 10 tablet 0  . Probiotic Product (ALIGN) 4 MG CAPS Take 4 mg by mouth daily.     . psyllium (METAMUCIL SMOOTH TEXTURE) 28 % packet Take 1 packet by mouth daily before breakfast.    . sodium chloride 1 g tablet Take 6 g by mouth daily.    [name] venlafaxine XR (EFFEXOR-XR) 75 MG 24 hr capsule TAKE  1 CAPSULE (75 MG TOTAL) BY MOUTH DAILY WITH BREAKFAST. 90 capsule 2   No current facility-administered medications on file prior to visit.    Allergies  Allergen Reactions  . Augmentin [Amoxicillin-Pot Clavulanate] Rash    rash  . Erythromycin Nausea Only  . Penicillins Itching and Rash    Has patient had a PCN reaction causing immediate rash, facial/tongue/throat swelling, SOB or lightheadedness with hypotension: No Has patient had a PCN reaction causing severe rash involving mucus membranes or skin necrosis: No Has patient had a PCN reaction that required hospitalization: No Has patient had a PCN reaction occurring within the last 10 years: No  If all of the above answers are "NO", then may proceed with Cephalosporin use.     There were no vitals taken for this visit.   Assessment/Plan:  1. Hypertension - Blood pressure is above goal of <130/80. She should not be on a thiazide diuretic due to her hx of hyponatremia because this can worsen it. Therefore will STOP HCTZ. Will start amlodipine, but at 48m instead of 159mto lessen the chance for LEE. I confirmed with the pharmacy that they have the prescription for amlodipine 1030meady for patient. Patient was educated to only take 1/2 tablet (5mg24maily and this would be different than directions on the bottle. Continue losartan 100mg62mly and clonidine 0.1mg d70my at bedtime for hot flashes. Follow up in clinic in 3 weeks. Patient to call with any issues in the interim. We also discussed the risks of artifical sweeteners and how they can increase blood and cause weight gain.   Thank you  MelissRamond Dialm.D, BCPS, CPP [facility] [id number]: [phone]

## 2020-10-03 ENCOUNTER — Other Ambulatory Visit: Payer: Self-pay | Admitting: Family Medicine

## 2020-10-05 DIAGNOSIS — D225 Melanocytic nevi of trunk: Secondary | ICD-10-CM | POA: Diagnosis not present

## 2020-10-05 DIAGNOSIS — L821 Other seborrheic keratosis: Secondary | ICD-10-CM | POA: Diagnosis not present

## 2020-10-05 DIAGNOSIS — L910 Hypertrophic scar: Secondary | ICD-10-CM | POA: Diagnosis not present

## 2020-10-05 DIAGNOSIS — D485 Neoplasm of uncertain behavior of skin: Secondary | ICD-10-CM | POA: Diagnosis not present

## 2020-10-05 DIAGNOSIS — D2261 Melanocytic nevi of right upper limb, including shoulder: Secondary | ICD-10-CM | POA: Diagnosis not present

## 2020-10-05 DIAGNOSIS — D2371 Other benign neoplasm of skin of right lower limb, including hip: Secondary | ICD-10-CM | POA: Diagnosis not present

## 2020-10-05 DIAGNOSIS — D2271 Melanocytic nevi of right lower limb, including hip: Secondary | ICD-10-CM | POA: Diagnosis not present

## 2020-10-05 DIAGNOSIS — D2272 Melanocytic nevi of left lower limb, including hip: Secondary | ICD-10-CM | POA: Diagnosis not present

## 2020-10-05 DIAGNOSIS — D2262 Melanocytic nevi of left upper limb, including shoulder: Secondary | ICD-10-CM | POA: Diagnosis not present

## 2020-10-05 DIAGNOSIS — D692 Other nonthrombocytopenic purpura: Secondary | ICD-10-CM | POA: Diagnosis not present

## 2020-10-05 DIAGNOSIS — L905 Scar conditions and fibrosis of skin: Secondary | ICD-10-CM | POA: Diagnosis not present

## 2020-10-05 NOTE — Progress Notes (Signed)
Chief Complaint:   OBESITY Corri is here to discuss her progress with her obesity treatment plan along with follow-up of her obesity related diagnoses. Reeya is on keeping a food journal and adhering to recommended goals of 1600 calories and 100 grams of protein and states she is following her eating plan approximately 90% of the time. Davon states she is doin 0 minutes 0 times per week.  Today's visit was #: 60 Starting weight: 220 lbs Starting date: 01/19/2018 Today's weight: 204 lbs Today's date: 09/28/2020 Total lbs lost to date: 16 lbs Total lbs lost since last in-office visit: 0  Interim History: Earnstine reports that her husband bought cheese cracker and she has been over indulging in them. She is averaging 1500-1600 calories and 130-150 grams of protein daily. She is not exercising due to back pain.  Subjective:   1. Type 2 diabetes mellitus with other specified complication, without long-term current use of insulin (HCC) Medication: Metformin. She denies any nausea, vomiting, or diarrhea. Fasting blood sugars ranging between 121-141.   Lab Results  Component Value Date   HGBA1C 5.7 (H) 04/20/2020   HGBA1C 5.9 (A) 12/09/2019   HGBA1C 6.1 06/08/2019   Lab Results  Component Value Date   MICROALBUR <0.7 01/05/2019   LDLCALC 63 04/20/2020   CREATININE 0.65 08/15/2020   Lab Results  Component Value Date   INSULIN 13.1 04/20/2020   INSULIN 12.1 01/19/2018   2. Essential hypertension Blood pressure improved but not controlled. Seeing Cardiology and will see the blood pressure clinic in 5 days.   BP Readings from Last 3 Encounters:  10/02/20 (!) 156/70  09/28/20 (!) 158/65  09/12/20 (!) 190/62   Assessment/Plan:   1. Type 2 diabetes mellitus with other specified complication, without long-term current use of insulin (HCC) Good blood sugar control is important to decrease the likelihood of diabetic complications such as nephropathy, neuropathy,  limb loss, blindness, coronary artery disease, and death. Intensive lifestyle modification including diet, exercise and weight loss are the first line of treatment for diabetes. Continue medications.  2. Essential hypertension Delonda is working on healthy weight loss and exercise to improve blood pressure control. We will watch for signs of hypotension as she continues her lifestyle modifications. Follow up with Cardiology and blood pressure clinic. Continue medications.  3. Obesity, current BMI 33 Damoni is currently in the action stage of change. As such, her goal is to continue with weight loss efforts. She has agreed to keeping a food journal and adhering to recommended goals of 1600 calories and 100 grams of protein.   Exercise goals: As is.  Behavioral modification strategies: meal planning and cooking strategies and keeping healthy foods in the home.  Zorah has agreed to follow-up with our clinic in 3 weeks. She was informed of the importance of frequent follow-up visits to maximize her success with intensive lifestyle modifications for her multiple health conditions.   Objective:   Blood pressure (!) 158/65, pulse 63, temperature 98.7 F (37.1 C), height 5\' 6"  (1.676 m), weight 204 lb (92.5 kg), SpO2 99 %. Body mass index is 32.93 kg/m.  General: Cooperative, alert, well developed, in no acute distress. HEENT: Conjunctivae and lids unremarkable. Cardiovascular: Regular rhythm.  Lungs: Normal work of breathing. Neurologic: No focal deficits.   Lab Results  Component Value Date   CREATININE 0.65 08/15/2020   BUN 23 08/15/2020   NA 132 (L) 08/15/2020   K 4.6 08/15/2020   CL 97 08/15/2020   CO2  28 08/15/2020   Lab Results  Component Value Date   ALT 19 08/15/2020   AST 33 08/15/2020   ALKPHOS 149 (H) 08/15/2020   BILITOT 0.5 08/15/2020   Lab Results  Component Value Date   HGBA1C 5.7 (H) 04/20/2020   HGBA1C 5.9 (A) 12/09/2019   HGBA1C 6.1 06/08/2019    HGBA1C 5.7 (A) 05/24/2019   HGBA1C 5.7 (A) 01/05/2019   Lab Results  Component Value Date   INSULIN 13.1 04/20/2020   INSULIN 12.1 01/19/2018   Lab Results  Component Value Date   TSH 3.38 06/04/2018   Lab Results  Component Value Date   CHOL 141 04/20/2020   HDL 65 04/20/2020   LDLCALC 63 04/20/2020   LDLDIRECT 58.0 11/25/2017   TRIG 66 04/20/2020   CHOLHDL 2.2 04/20/2020   Lab Results  Component Value Date   WBC 10.9 (H) 06/09/2020   HGB 12.9 06/09/2020   HCT 39.0 06/09/2020   MCV 87.6 06/09/2020   PLT 338 06/09/2020   No results found for: IRON, TIBC, FERRITIN  Obesity Behavioral Intervention:   Approximately 15 minutes were spent on the discussion below.  ASK: We discussed the diagnosis of obesity with [name] today and Rosalea agreed to give [name] permission to discuss obesity behavioral modification therapy today.  ASSESS: Cyanna has the diagnosis of obesity and her BMI today is 33. Kiaraliz is in the action stage of change.   ADVISE: Haiven was educated on the multiple health risks of obesity as well as the benefit of weight loss to improve her health. She was advised of the need for long term treatment and the importance of lifestyle modifications to improve her current health and to decrease her risk of future health problems.  AGREE: Multiple dietary modification options and treatment options were discussed and Clary agreed to follow the recommendations documented in the above note.  ARRANGE: Daniela was educated on the importance of frequent visits to treat obesity as outlined per CMS and USPSTF guidelines and agreed to schedule her next follow up appointment today.  Attestation Statements:   Reviewed by clinician on day of visit: allergies, medications, problem list, medical history, surgical history, family history, social history, and previous encounter notes.  Leodis Binet Friedenbach, CMA, am acting as Location manager for [employer], PA-C.  I have reviewed the above documentation for accuracy and completeness, and I agree with the above. [name], PA-C

## 2020-10-10 DIAGNOSIS — M7062 Trochanteric bursitis, left hip: Secondary | ICD-10-CM | POA: Diagnosis not present

## 2020-10-10 DIAGNOSIS — M47816 Spondylosis without myelopathy or radiculopathy, lumbar region: Secondary | ICD-10-CM | POA: Diagnosis not present

## 2020-10-11 ENCOUNTER — Other Ambulatory Visit: Payer: Self-pay | Admitting: Physical Medicine and Rehabilitation

## 2020-10-11 DIAGNOSIS — M545 Low back pain, unspecified: Secondary | ICD-10-CM

## 2020-10-24 ENCOUNTER — Ambulatory Visit (INDEPENDENT_AMBULATORY_CARE_PROVIDER_SITE_OTHER): Payer: Medicare PPO | Admitting: Pharmacist

## 2020-10-24 ENCOUNTER — Encounter (INDEPENDENT_AMBULATORY_CARE_PROVIDER_SITE_OTHER): Payer: Self-pay | Admitting: Physician Assistant

## 2020-10-24 ENCOUNTER — Ambulatory Visit (INDEPENDENT_AMBULATORY_CARE_PROVIDER_SITE_OTHER): Payer: Medicare PPO | Admitting: Physician Assistant

## 2020-10-24 ENCOUNTER — Other Ambulatory Visit: Payer: Self-pay

## 2020-10-24 VITALS — BP 148/50 | HR 68

## 2020-10-24 VITALS — BP 144/56 | HR 64 | Temp 98.4°F | Ht 66.0 in | Wt 203.0 lb

## 2020-10-24 DIAGNOSIS — E669 Obesity, unspecified: Secondary | ICD-10-CM | POA: Diagnosis not present

## 2020-10-24 DIAGNOSIS — E1169 Type 2 diabetes mellitus with other specified complication: Secondary | ICD-10-CM | POA: Diagnosis not present

## 2020-10-24 DIAGNOSIS — Z6833 Body mass index (BMI) 33.0-33.9, adult: Secondary | ICD-10-CM | POA: Diagnosis not present

## 2020-10-24 DIAGNOSIS — I10 Essential (primary) hypertension: Secondary | ICD-10-CM | POA: Diagnosis not present

## 2020-10-24 DIAGNOSIS — E66811 Obesity, class 1: Secondary | ICD-10-CM

## 2020-10-24 MED ORDER — OZEMPIC (0.25 OR 0.5 MG/DOSE) 2 MG/1.5ML ~~LOC~~ SOPN: 0.2500 mg | PEN_INJECTOR | SUBCUTANEOUS | 0 refills | Status: DC

## 2020-10-24 NOTE — Progress Notes (Signed)
Patient ID: [name]                 DOB: 1951-05-15                      MRN: [id number]     HPI: [name] is a 70 y.o. female referred by Dr. Johney Frame to HTN clinic. PMH is significant for diabetes, hyperlipidemia, OSA on CPAP, aortic valve replacement with bioprosthetic bovine pericardial tissue valve 08/23/16, HTN, obesity, non-obstructive CAD, diastolic CHF and TIA. She is currently following in the [facility] clinic. On 09/12/20 her blood pressure was 190/62. Losartan was increased from 39m to 1068mand HCTZ 2582maily was added on 09/14/20. Blood pressures were still elevated after this change, therefore amlodipine 32m65mily was added on 09/26/20.  At last HTN clinic visit, HCTZ was stopped due to patient having a history of hyponatremia (on salt tabs and demeclocycline by her endocrinologist) and amlodipine 5mg 32mly was started. Her home BP's were in the 150's. [facility] Home cuff compared to clinic cuff: Home wrist cuff read 152/71, clinic manual cuff 160/68, clinic automatic 156/70.  Patient presents today for follow up. States she has been checking her blood pressure 2 hours after taking her medications at night. She denies dizziness or lightheadedness unless she stands up too fast. Denies headache, blurred vision or swelling. She has not been walking except for her errands at the store. Blood pressure varies greatly at home. High readings do improve with recheck.  Current HTN meds: clonidine 0.1mg d49my at bedtime (for hot flashes), losartan 100mg d74m, amlodipine 5mg dai55mPreviously tried:HCTZ (should avoid due to hyponatremia) BP goal: <130/80  Family History: The patient's family history includes Alcohol abuse in her father; Anxiety disorder in her mother; Breast cancer in her maternal grandmother; COPD in her mother; Colon polyps in her maternal aunt and mother; Diabetes in her paternal uncle;  Gallbladder disease in her maternaTl grandmother; Heart disease in her father; Irritable bowel syndrome in her mother. There is no history of Other, Colon cancer, Esophageal cancer, Rectal cancer, or Stomach cancer.  Social History: No ETOH use, no tobacco use, no illicit drugs  Diet: takes salt tabs 16oz bottle of coke zero unsweet tea at lunch if she is out to lunch  Exercise: trying to walk 3 times a week (currently at 15 min)  Home BP readings: 161/87, 122/56, 151/78, 110/69, 130/77, 148/74, 182/82, 120/68, 205/56, 151/55, 172/87, 164/84, 136/99, 192/93, 183/85, 153/62, 152/59, 144/77, 126/77, 146/77, 151/61, 148/74, 111/56, 142/71, 131/73, 113/80  Wt Readings from Last 3 Encounters:  09/28/20 204 lb (92.5 kg)  09/12/20 202 lb (91.6 kg)  08/30/20 206 lb 9.6 oz (93.7 kg)   BP Readings from Last 3 Encounters:  10/02/20 (!) 156/70  09/28/20 (!) 158/65  09/12/20 (!) 190/62   Pulse Readings from Last 3 Encounters:  10/02/20 68  09/28/20 63  09/12/20 66    Renal function: CrCl cannot be calculated (Patient's most recent lab result is older than the maximum 21 days allowed.).  Past Medical History:  Diagnosis Date  . Allergy   . Anxiety   . Arthritis    back & knee  . Asthma     mild per pt shows up with resp illness  . Colon polyps   . Constipation   . Diabetes mellitus without complication (HCC)   .[place]   . Gallstones   .  Gastric polyps   . Gastroparesis   . GERD (gastroesophageal reflux disease)   . Headache    sinus headaches and migraines at times  . Heart murmur   . History of migraine headaches   . HTN (hypertension)   . Hyperlipidemia   . IBS (irritable bowel syndrome)   . Joint pain   . Lumbar disc disease   . S/P aortic valve replacement with bioprosthetic valve 08/23/2016   25 mm [employer] rapid-deployment bovine pericardial tissue valve via partial upper mini sternotomy  . Sleep apnea    CPAP  . TIA (transient ischemic attack)      Current Outpatient Medications on File Prior to Visit  Medication Sig Dispense Refill  . ACCU-CHEK GUIDE test strip USE TO TEST BLOOD SUGARS DAILY. DX: E11.9 100 strip 12  . Accu-Chek Softclix Lancets lancets USE AS INSTRUCTED 100 each 12  . amLODipine (NORVASC) 10 MG tablet Take 0.5 tablets (5 mg total) by mouth daily. 90 tablet 1  . atorvastatin (LIPITOR) 40 MG tablet TAKE 1 TABLET (40 MG TOTAL) BY MOUTH DAILY AT 6 PM. 90 tablet 3  . Biotin 5000 MCG TABS Take 5,000 mcg by mouth daily.     . blood glucose meter kit and supplies KIT Dispense based on patient and insurance preference. Use up to four times daily as directed. Dx: E11.9 1 each 0  . calcium-vitamin D (OSCAL WITH D) 500-200 MG-UNIT per tablet Take 2 tablets by mouth daily.     . Cholecalciferol (VITAMIN D) 125 MCG (5000 UT) CAPS Take 5,000 Units by mouth daily.    . cloNIDine (CATAPRES) 0.1 MG tablet Take 1 tablet (0.1 mg total) by mouth at bedtime. 90 tablet 2  . clopidogrel (PLAVIX) 75 MG tablet TAKE 1 TABLET BY MOUTH EVERY DAY 90 tablet 3  . demeclocycline (DECLOMYCIN) 300 MG tablet TAKE 1 TABLET (300 MG TOTAL) BY MOUTH 2 (TWO) TIMES DAILY. 180 tablet 3  . fluticasone (FLONASE) 50 MCG/ACT nasal spray Place 2 sprays into both nostrils daily.     [name] ipratropium (ATROVENT) 0.03 % nasal spray 2 SPRAYS IN EACH NOSTRIL AS NEEDED THREE TIMES AS NEEDED    . loratadine (CLARITIN) 10 MG tablet Take 10 mg by mouth at bedtime.     [name] losartan (COZAAR) 100 MG tablet Take 1 tablet (100 mg total) by mouth daily. 90 tablet 1  . Melatonin 5 MG TABS Take by mouth.     . metFORMIN (GLUCOPHAGE) 500 MG tablet Take 500 mg by mouth daily with breakfast.    . metoCLOPramide (REGLAN) 10 MG tablet TAKE 1/2 TABLET BY MOUTH 4 TIMES A DAY 60 tablet 3  . Multiple Vitamin (MULTIVITAMIN WITH MINERALS) TABS tablet Take 1 tablet by mouth daily.    . naproxen sodium (ALEVE) 220 MG tablet Take 220 mg by mouth.    . OMEPRAZOLE MAGNESIUM PO Take 40 mg by mouth  daily with breakfast.     . ondansetron (ZOFRAN ODT) 4 MG disintegrating tablet Take 1 tablet (4 mg total) by mouth every 8 (eight) hours as needed. 10 tablet 0  . Probiotic Product (ALIGN) 4 MG CAPS Take 4 mg by mouth daily.     . psyllium (METAMUCIL SMOOTH TEXTURE) 28 % packet Take 1 packet by mouth daily before breakfast.    . sodium chloride 1 g tablet Take 6 g by mouth daily.    [name] venlafaxine XR (EFFEXOR-XR) 75 MG 24 hr capsule TAKE 1 CAPSULE (75 MG TOTAL) BY  MOUTH DAILY WITH BREAKFAST. 90 capsule 2   No current facility-administered medications on file prior to visit.    Allergies  Allergen Reactions  . Augmentin [Amoxicillin-Pot Clavulanate] Rash    rash  . Erythromycin Nausea Only  . Penicillins Itching and Rash    Has patient had a PCN reaction causing immediate rash, facial/tongue/throat swelling, SOB or lightheadedness with hypotension: No Has patient had a PCN reaction causing severe rash involving mucus membranes or skin necrosis: No Has patient had a PCN reaction that required hospitalization: No Has patient had a PCN reaction occurring within the last 10 years: No  If all of the above answers are "NO", then may proceed with Cephalosporin use.     There were no vitals taken for this visit.   Assessment/Plan:  1. Hypertension - Blood pressure is above goal of <130/80 in clinic today. Will increase amlodipine to 68m daily. Continue losartan 1081mdaily. I have encouraged her to go for walks where she increases her heart rate and breathing. Will check BMP today since it has not been checked since increasing losartan. Follow up in clinic in ~2 weeks.   Thank you  MeRamond DialPharm.D, BCPS, CPP CoNew [id number]. [id number] Griffin StreetGrTrinityNC [address]Phone: [phone]: [phone]

## 2020-10-24 NOTE — Patient Instructions (Addendum)
Please increase your amlodipine to 10mg  (1 tablet) daily. Continue losartan 100mg  daily  Try to go out for a walk. The goal is to increase your heart rate and breathing.  Call me at [phone] with any questions

## 2020-10-25 LAB — BASIC METABOLIC PANEL
BUN/Creatinine Ratio: 28 (ref 12–28)
BUN: 19 mg/dL (ref 8–27)
CO2: 23 mmol/L (ref 20–29)
Calcium: 9.4 mg/dL (ref 8.7–10.3)
Chloride: 95 mmol/L — ABNORMAL LOW (ref 96–106)
Creatinine, Ser: 0.69 mg/dL (ref 0.57–1.00)
Glucose: 115 mg/dL — ABNORMAL HIGH (ref 65–99)
Potassium: 4.7 mmol/L (ref 3.5–5.2)
Sodium: 132 mmol/L — ABNORMAL LOW (ref 134–144)
eGFR: 94 mL/min/{1.73_m2} (ref >59)

## 2020-10-25 NOTE — Progress Notes (Signed)
Chief Complaint:   OBESITY [name] is here to discuss her progress with her obesity treatment plan along with follow-up of her obesity related diagnoses. [name] is on keeping a food journal and adhering to recommended goals of 1600 calories and 100 grams of protein daily and states she is following her eating plan approximately 80% of the time. [name] states she is doing 0 minutes 0 times per week.  Today's visit was #: 36 Starting weight: 220 lbs Starting date: 01/19/2018 Today's weight: 203 lbs Today's date: 10/24/2020 Total lbs lost to date: 17 Total lbs lost since last in-office visit: 1  Interim History: [name] did well with weight loss. She reports that she eats fried chicken tenders once a month. She is averaging 1500-1600 calories and getting 120 grams of protein daily.  Subjective:   1. Type 2 diabetes mellitus with other specified complication, without long-term current use of insulin (HCC) [name]'s fasting BGs range between 114-137. She is on metformin and her last A1c 5.7.  Assessment/Plan:   1. Type 2 diabetes mellitus with other specified complication, without long-term current use of insulin ([place]) Elisa agreed to start Ozempic 0.25 mg with no refills, and stop metformin when starting Ozempic. Good blood sugar control is important to decrease the likelihood of diabetic complications such as nephropathy, neuropathy, limb loss, blindness, coronary artery disease, and death. Intensive lifestyle modification including diet, exercise and weight loss are the first line of treatment for diabetes.   - Semaglutide,0.25 or 0.5MG /DOS, (OZEMPIC, 0.25 OR 0.5 MG/DOSE,) 2 MG/1.5ML SOPN; Inject 0.25 mg into the skin once a week.  Dispense: 1.5 mL; Refill: 0  2. Class 1 obesity with serious comorbidity and body mass index (BMI) of 33.0 to 33.9 in adult, unspecified obesity type [name] is currently in the action stage of change. As such, her goal is to  continue with weight loss efforts. She has agreed to keeping a food journal and adhering to recommended goals of 1300-1500 calories and 95 grams of protein daily.   Exercise goals: No exercise has been prescribed at this time.  Behavioral modification strategies: decreasing eating out and meal planning and cooking strategies.  [name] has agreed to follow-up with our clinic in 2 weeks. She was informed of the importance of frequent follow-up visits to maximize her success with intensive lifestyle modifications for her multiple health conditions.   Objective:   Blood pressure (!) 144/56, pulse 64, temperature 98.4 F (36.9 C), height 5\' 6"  (1.676 m), weight 203 lb (92.1 kg), SpO2 97 %. Body mass index is 32.77 kg/m.  General: Cooperative, alert, well developed, in no acute distress. HEENT: Conjunctivae and lids unremarkable. Cardiovascular: Regular rhythm.  Lungs: Normal work of breathing. Neurologic: No focal deficits.   Lab Results  Component Value Date   CREATININE 0.69 10/24/2020   BUN 19 10/24/2020   NA 132 (L) 10/24/2020   K 4.7 10/24/2020   CL 95 (L) 10/24/2020   CO2 23 10/24/2020   Lab Results  Component Value Date   ALT 19 08/15/2020   AST 33 08/15/2020   ALKPHOS 149 (H) 08/15/2020   BILITOT 0.5 08/15/2020   Lab Results  Component Value Date   HGBA1C 5.7 (H) 04/20/2020   HGBA1C 5.9 (A) 12/09/2019   HGBA1C 6.1 06/08/2019   HGBA1C 5.7 (A) 05/24/2019   HGBA1C 5.7 (A) 01/05/2019   Lab Results  Component Value Date   INSULIN 13.1 04/20/2020   INSULIN 12.1 01/19/2018   Lab Results  Component Value  Date   TSH 3.38 06/04/2018   Lab Results  Component Value Date   CHOL 141 04/20/2020   HDL 65 04/20/2020   LDLCALC 63 04/20/2020   LDLDIRECT 58.0 11/25/2017   TRIG 66 04/20/2020   CHOLHDL 2.2 04/20/2020   Lab Results  Component Value Date   WBC 10.9 (H) 06/09/2020   HGB 12.9 06/09/2020   HCT 39.0 06/09/2020   MCV 87.6 06/09/2020   PLT 338 06/09/2020    No results found for: IRON, TIBC, FERRITIN  Obesity Behavioral Intervention:   Approximately 15 minutes were spent on the discussion below.  ASK: We discussed the diagnosis of obesity with [name] today and [name] agreed to give [name] permission to discuss obesity behavioral modification therapy today.  ASSESS: [name] has the diagnosis of obesity and her BMI today is 32.78. [name] is in the action stage of change.   ADVISE: [name] was educated on the multiple health risks of obesity as well as the benefit of weight loss to improve her health. She was advised of the need for long term treatment and the importance of lifestyle modifications to improve her current health and to decrease her risk of future health problems.  AGREE: Multiple dietary modification options and treatment options were discussed and [name] agreed to follow the recommendations documented in the above note.  ARRANGE: [name] was educated on the importance of frequent visits to treat obesity as outlined per CMS and USPSTF guidelines and agreed to schedule her next follow up appointment today.  Attestation Statements:   Reviewed by clinician on day of visit: allergies, medications, problem list, medical history, surgical history, family history, social history, and previous encounter notes.   [name], am acting as transcriptionist for [employer], PA-C.  I have reviewed the above documentation for accuracy and completeness, and I agree with the above. [name], PA-C

## 2020-10-30 ENCOUNTER — Ambulatory Visit
Admission: RE | Admit: 2020-10-30 | Discharge: 2020-10-30 | Disposition: A | Discharge status: Home / Self Care | Payer: Medicare PPO | Source: Ambulatory Visit | Attending: Physical Medicine and Rehabilitation | Admitting: Physical Medicine and Rehabilitation

## 2020-10-30 DIAGNOSIS — M48061 Spinal stenosis, lumbar region without neurogenic claudication: Secondary | ICD-10-CM | POA: Diagnosis not present

## 2020-10-30 DIAGNOSIS — M545 Low back pain, unspecified: Secondary | ICD-10-CM

## 2020-11-06 DIAGNOSIS — G4733 Obstructive sleep apnea (adult) (pediatric): Secondary | ICD-10-CM | POA: Diagnosis not present

## 2020-11-06 DIAGNOSIS — M7062 Trochanteric bursitis, left hip: Secondary | ICD-10-CM | POA: Diagnosis not present

## 2020-11-06 DIAGNOSIS — M48061 Spinal stenosis, lumbar region without neurogenic claudication: Secondary | ICD-10-CM | POA: Diagnosis not present

## 2020-11-06 DIAGNOSIS — M47816 Spondylosis without myelopathy or radiculopathy, lumbar region: Secondary | ICD-10-CM | POA: Diagnosis not present

## 2020-11-08 ENCOUNTER — Ambulatory Visit (INDEPENDENT_AMBULATORY_CARE_PROVIDER_SITE_OTHER): Payer: Medicare PPO | Admitting: Physician Assistant

## 2020-11-08 ENCOUNTER — Encounter (INDEPENDENT_AMBULATORY_CARE_PROVIDER_SITE_OTHER): Payer: Self-pay | Admitting: Physician Assistant

## 2020-11-08 ENCOUNTER — Other Ambulatory Visit: Payer: Self-pay

## 2020-11-08 VITALS — BP 161/71 | HR 67 | Temp 98.6°F | Ht 66.0 in | Wt 204.0 lb

## 2020-11-08 DIAGNOSIS — K5900 Constipation, unspecified: Secondary | ICD-10-CM

## 2020-11-08 DIAGNOSIS — I10 Essential (primary) hypertension: Secondary | ICD-10-CM | POA: Diagnosis not present

## 2020-11-08 DIAGNOSIS — Z6833 Body mass index (BMI) 33.0-33.9, adult: Secondary | ICD-10-CM

## 2020-11-08 DIAGNOSIS — E669 Obesity, unspecified: Secondary | ICD-10-CM

## 2020-11-08 NOTE — Progress Notes (Signed)
Chief Complaint:   OBESITY Kahliya is here to discuss her progress with her obesity treatment plan along with follow-up of her obesity related diagnoses. Katharyn is on keeping a food journal and adhering to recommended goals of 1300-1500 calories and 95 grams of protein daily and states she is following her eating plan approximately 90-95% of the time. Irina states she is doing 0 minutes 0 times per week.  Today's visit was #: 18 Starting weight: 220 lbs Starting date: 01/19/2018 Today's weight: 204 lbs Today's date: 11/08/2020 Total lbs lost to date: 16 Total lbs lost since last in-office visit: 0  Interim History: Andera reports that Ozempic made her very constipated and she would like to discuss possible discontinuation of it. Her appetite has been decreased as well. She is very uncomfortable and bloated today.  Subjective:   1. Essential hypertension Tranesha is seeing the blood pressure clinic, and her blood pressure is elevated today but overall improving after reviewing her blood pressure diary.   2. Constipation, unspecified constipation type Fae notes her constipation is worse since starting Ozempic. She denies blood in her stool.  Assessment/Plan:   1. Essential hypertension Karolee will continue to follow up with the blood pressure clinic, and will continue working on healthy weight loss and exercise to improve blood pressure control. We will watch for signs of hypotension as she continues her lifestyle modifications.  2. Constipation, unspecified constipation type Deaira was informed that a decrease in bowel movement frequency is normal while losing weight, but stools should not be hard or painful. Detra is to add colace 100 mg BID #60 with no refills., and change metamucil to twice daily. Orders and follow up as documented in patient record.   Counseling Getting to Good Bowel Health: Your goal is to have one soft bowel movement  each day. Drink at least 8 glasses of water each day. Eat plenty of fiber (goal is over 25 grams each day). It is best to get most of your fiber from dietary sources which includes leafy green vegetables, fresh fruit, and whole grains. You may need to add fiber with the help of OTC fiber supplements. These include Metamucil, Citrucel, and Flaxseed. If you are still having trouble, try adding Miralax or Magnesium Citrate. If all of these changes do not work, Cabin crew.  3. Class 1 obesity with serious comorbidity and body mass index (BMI) of 33.0 to 33.9 in adult, unspecified obesity type Kasmira is currently in the action stage of change. As such, her goal is to continue with weight loss efforts. She has agreed to keeping a food journal and adhering to recommended goals of 1300-1500 calories and 95 grams of protein daily.   Exercise goals: No exercise has been prescribed at this time.  Behavioral modification strategies: meal planning and cooking strategies and planning for success.  Abbiegail has agreed to follow-up with our clinic in 2 weeks. She was informed of the importance of frequent follow-up visits to maximize her success with intensive lifestyle modifications for her multiple health conditions.   Objective:   Blood pressure (!) 161/71, pulse 67, temperature 98.6 F (37 C), height 5\' 6"  (1.676 m), weight 204 lb (92.5 kg), SpO2 99 %. Body mass index is 32.93 kg/m.  General: Cooperative, alert, well developed, in no acute distress. HEENT: Conjunctivae and lids unremarkable. Cardiovascular: Regular rhythm.  Lungs: Normal work of breathing. Neurologic: No focal deficits.   Lab Results  Component Value Date   CREATININE 0.69 10/24/2020  BUN 19 10/24/2020   NA 132 (L) 10/24/2020   K 4.7 10/24/2020   CL 95 (L) 10/24/2020   CO2 23 10/24/2020   Lab Results  Component Value Date   ALT 19 08/15/2020   AST 33 08/15/2020   ALKPHOS 149 (H) 08/15/2020   BILITOT 0.5  08/15/2020   Lab Results  Component Value Date   HGBA1C 5.7 (H) 04/20/2020   HGBA1C 5.9 (A) 12/09/2019   HGBA1C 6.1 06/08/2019   HGBA1C 5.7 (A) 05/24/2019   HGBA1C 5.7 (A) 01/05/2019   Lab Results  Component Value Date   INSULIN 13.1 04/20/2020   INSULIN 12.1 01/19/2018   Lab Results  Component Value Date   TSH 3.38 06/04/2018   Lab Results  Component Value Date   CHOL 141 04/20/2020   HDL 65 04/20/2020   LDLCALC 63 04/20/2020   LDLDIRECT 58.0 11/25/2017   TRIG 66 04/20/2020   CHOLHDL 2.2 04/20/2020   Lab Results  Component Value Date   WBC 10.9 (H) 06/09/2020   HGB 12.9 06/09/2020   HCT 39.0 06/09/2020   MCV 87.6 06/09/2020   PLT 338 06/09/2020   No results found for: IRON, TIBC, FERRITIN  Obesity Behavioral Intervention:   Approximately 15 minutes were spent on the discussion below.  ASK: We discussed the diagnosis of obesity with [name] today and Makinzie agreed to give [name] permission to discuss obesity behavioral modification therapy today.  ASSESS: Ma has the diagnosis of obesity and her BMI today is 32.94. [name] is in the action stage of change.   ADVISE: Timothea was educated on the multiple health risks of obesity as well as the benefit of weight loss to improve her health. She was advised of the need for long term treatment and the importance of lifestyle modifications to improve her current health and to decrease her risk of future health problems.  AGREE: Multiple dietary modification options and treatment options were discussed and Elyza agreed to follow the recommendations documented in the above note.  ARRANGE: Janelis was educated on the importance of frequent visits to treat obesity as outlined per CMS and USPSTF guidelines and agreed to schedule her next follow up appointment today.  Attestation Statements:   Reviewed by clinician on day of visit: allergies, medications, problem list, medical history, surgical  history, family history, social history, and previous encounter notes.   Wilhemena Durie, am acting as transcriptionist for [employer], PA-C.  I have reviewed the above documentation for accuracy and completeness, and I agree with the above. [name], PA-C

## 2020-11-09 ENCOUNTER — Ambulatory Visit (INDEPENDENT_AMBULATORY_CARE_PROVIDER_SITE_OTHER): Payer: Medicare PPO | Admitting: Pharmacist

## 2020-11-09 VITALS — BP 140/58 | HR 70

## 2020-11-09 DIAGNOSIS — E1159 Type 2 diabetes mellitus with other circulatory complications: Secondary | ICD-10-CM

## 2020-11-09 DIAGNOSIS — I152 Hypertension secondary to endocrine disorders: Secondary | ICD-10-CM | POA: Diagnosis not present

## 2020-11-09 MED ORDER — DOCUSATE SODIUM 100 MG PO CAPS: 100.0000 mg | ORAL_CAPSULE | Freq: Two times a day (BID) | ORAL | 0 refills | Status: DC

## 2020-11-09 MED ORDER — IRBESARTAN 300 MG PO TABS: ORAL_TABLET | ORAL | 3 refills | Status: DC

## 2020-11-09 NOTE — Progress Notes (Signed)
Patient ID: [name]                 DOB: Jan 02, 1951                      MRN: [id number]     HPI: [name] is a 70 y.o. female referred by Dr. Johney Frame to HTN clinic. PMH is significant for diabetes, hyperlipidemia, OSA on CPAP, aortic valve replacement with bioprosthetic bovine pericardial tissue valve 08/23/16, HTN, obesity, non-obstructive CAD, diastolic CHF and TIA. She is currently following in the [facility]. On 09/12/20 her blood pressure was 190/62. Losartan was increased from 24m to 1041mand HCTZ 2569maily was added on 09/14/20. Blood pressures were still elevated after this change, therefore amlodipine 60m53mily was added on 09/26/20.  At last HTN clinic visit amlodipine was increased to 60mg41mly. Her home BP's were variable. [facility]. Home cuff compared to clinic cuff: Home wrist cuff read 152/71, clinic manual cuff 160/68, clinic automatic 156/70.  Patient presents today for follow up. States she has been checking her blood pressure 2-3 hours after taking her medications at night. She denies dizziness or lightheadedness unless she stands up too fast. Denies headache, blurred vision or swelling. She was started on Ozempic by [facility]. Started on Saturday. Is having a little nausea and some constipation.  Current HTN meds: clonidine 0.1mg d35my at bedtime (for hot flashes), losartan 100mg d26m, amlodipine 60mg da21mPreviously tried:HCTZ (should avoid due to hyponatremia) BP goal: <130/80  Family History: The patient's family history includes Alcohol abuse in her father; Anxiety disorder in her mother; Breast cancer in her maternal grandmother; COPD in her mother; Colon polyps in her maternal aunt and mother; Diabetes in her paternal uncle; Gallbladder disease in her maternaTl grandmother; Heart disease in her father; Irritable bowel syndrome in her mother. There is no  history of Other, Colon cancer, Esophageal cancer, Rectal cancer, or Stomach cancer.  Social History: No ETOH use, no tobacco use, no illicit drugs  Diet: takes salt tabs 16oz bottle of coke zero unsweet tea at lunch if she is out to lunch  Exercise: trying to walk 3 times a week (currently at 15 min)  Home BP readings: 107/55, 139/79, 121/62, 124/51, 132/65, 136/62, 107/59, 125/63, 144/69, 133/77, 146/72, 137/64, 142/69, 139/72, 142/70, 139/64, 132/69, 122/69  Wt Readings from Last 3 Encounters:  11/08/20 204 lb (92.5 kg)  10/24/20 203 lb (92.1 kg)  09/28/20 204 lb (92.5 kg)   BP Readings from Last 3 Encounters:  11/08/20 (!) 161/71  10/24/20 (!) 148/50  10/24/20 (!) 144/56   Pulse Readings from Last 3 Encounters:  11/08/20 67  10/24/20 68  10/24/20 64    Renal function: Estimated Creatinine Clearance: 76.1 mL/min (by C-G formula based on SCr of 0.69 mg/dL).  Past Medical History:  Diagnosis Date  . Allergy   . Anxiety   . Arthritis    back & knee  . Asthma     mild per pt shows up with resp illness  . Colon polyps   . Constipation   . Diabetes mellitus without complication (HCC)   .Chesterspnea   . Gallstones   . Gastric polyps   . Gastroparesis   . GERD (gastroesophageal reflux disease)   . Headache    sinus headaches and migraines at times  . Heart murmur   . History of  migraine headaches   . HTN (hypertension)   . Hyperlipidemia   . IBS (irritable bowel syndrome)   . Joint pain   . Lumbar disc disease   . S/P aortic valve replacement with bioprosthetic valve 08/23/2016   25 mm Edwards Intuity rapid-deployment bovine pericardial tissue valve via partial upper mini sternotomy  . Sleep apnea    CPAP  . TIA (transient ischemic attack)     Current Outpatient Medications on File Prior to Visit  Medication Sig Dispense Refill  . ACCU-CHEK GUIDE test strip USE TO TEST BLOOD SUGARS DAILY. DX: E11.9 100 strip 12  . Accu-Chek Softclix Lancets lancets USE AS  INSTRUCTED 100 each 12  . amLODipine (NORVASC) 10 MG tablet Take 10 mg by mouth daily. 90 tablet 1  . atorvastatin (LIPITOR) 40 MG tablet TAKE 1 TABLET (40 MG TOTAL) BY MOUTH DAILY AT 6 PM. 90 tablet 3  . Biotin 5000 MCG TABS Take 5,000 mcg by mouth daily.     . blood glucose meter kit and supplies KIT Dispense based on patient and insurance preference. Use up to four times daily as directed. Dx: E11.9 1 each 0  . calcium-vitamin D (OSCAL WITH D) 500-200 MG-UNIT per tablet Take 2 tablets by mouth daily.     . Cholecalciferol (VITAMIN D) 125 MCG (5000 UT) CAPS Take 5,000 Units by mouth daily.    . cloNIDine (CATAPRES) 0.1 MG tablet Take 1 tablet (0.1 mg total) by mouth at bedtime. 90 tablet 2  . clopidogrel (PLAVIX) 75 MG tablet TAKE 1 TABLET BY MOUTH EVERY DAY 90 tablet 3  . demeclocycline (DECLOMYCIN) 300 MG tablet TAKE 1 TABLET (300 MG TOTAL) BY MOUTH 2 (TWO) TIMES DAILY. 180 tablet 3  . docusate sodium (COLACE) 100 MG capsule Take 1 capsule (100 mg total) by mouth 2 (two) times daily. 60 capsule 0  . fluticasone (FLONASE) 50 MCG/ACT nasal spray Place 2 sprays into both nostrils daily.     [name] ipratropium (ATROVENT) 0.03 % nasal spray 2 SPRAYS IN EACH NOSTRIL AS NEEDED THREE TIMES AS NEEDED    . loratadine (CLARITIN) 10 MG tablet Take 10 mg by mouth at bedtime.     [name] losartan (COZAAR) 100 MG tablet Take 1 tablet (100 mg total) by mouth daily. 90 tablet 1  . Melatonin 5 MG TABS Take by mouth.     . metFORMIN (GLUCOPHAGE) 500 MG tablet Take 500 mg by mouth daily with breakfast.    . metoCLOPramide (REGLAN) 10 MG tablet TAKE 1/2 TABLET BY MOUTH 4 TIMES A DAY 60 tablet 3  . Multiple Vitamin (MULTIVITAMIN WITH MINERALS) TABS tablet Take 1 tablet by mouth daily.    . naproxen sodium (ALEVE) 220 MG tablet Take 220 mg by mouth.    . OMEPRAZOLE MAGNESIUM PO Take 40 mg by mouth daily with breakfast.     . ondansetron (ZOFRAN ODT) 4 MG disintegrating tablet Take 1 tablet (4 mg total) by mouth every 8  (eight) hours as needed. 10 tablet 0  . Probiotic Product (ALIGN) 4 MG CAPS Take 4 mg by mouth daily.     . psyllium (METAMUCIL SMOOTH TEXTURE) 28 % packet Take 1 packet by mouth daily before breakfast.    . Semaglutide,0.25 or 0.5MG/DOS, (OZEMPIC, 0.25 OR 0.5 MG/DOSE,) 2 MG/1.5ML SOPN Inject 0.25 mg into the skin once a week. 1.5 mL 0  . sodium chloride 1 g tablet Take 6 g by mouth daily.    [name] venlafaxine XR (EFFEXOR-XR) 75 MG  24 hr capsule TAKE 1 CAPSULE (75 MG TOTAL) BY MOUTH DAILY WITH BREAKFAST. 90 capsule 2   No current facility-administered medications on file prior to visit.    Allergies  Allergen Reactions  . Augmentin [Amoxicillin-Pot Clavulanate] Rash    rash  . Erythromycin Nausea Only  . Penicillins Itching and Rash    Has patient had a PCN reaction causing immediate rash, facial/tongue/throat swelling, SOB or lightheadedness with hypotension: No Has patient had a PCN reaction causing severe rash involving mucus membranes or skin necrosis: No Has patient had a PCN reaction that required hospitalization: No Has patient had a PCN reaction occurring within the last 10 years: No  If all of the above answers are "NO", then may proceed with Cephalosporin use.     There were no vitals taken for this visit.   Assessment/Plan:  1. Hypertension - Blood pressure is above goal of <130/80 in clinic today. Blood pressures range mainly from 120-140's. Will try switching losartan to irbesartan for better blood pressure lowering. STOP losartan, start irbesartan 361m daily. Continue clonidine 0.170mat bedtime and amlodipine 1054maily. Follow up in 3 weeks.  Thank you  MelRamond Dialharm.D, BCPS, CPP [id number] [id number]: [phone]: [phone]

## 2020-11-09 NOTE — Patient Instructions (Addendum)
It was nice seeing you today!  Please STOP taking losartan  START taking irbesartan 300mg  daily  Continue clonidine 0.1mg  daily at bedtime (for hot flashes) and amlodipine 10mg  daily  Continue checking blood pressure at home once a day  Call me at [phone] with any questions

## 2020-11-13 DIAGNOSIS — M5416 Radiculopathy, lumbar region: Secondary | ICD-10-CM | POA: Diagnosis not present

## 2020-11-13 DIAGNOSIS — M48061 Spinal stenosis, lumbar region without neurogenic claudication: Secondary | ICD-10-CM | POA: Diagnosis not present

## 2020-11-22 ENCOUNTER — Ambulatory Visit (INDEPENDENT_AMBULATORY_CARE_PROVIDER_SITE_OTHER): Payer: Medicare PPO | Admitting: Family Medicine

## 2020-11-22 ENCOUNTER — Other Ambulatory Visit: Payer: Self-pay

## 2020-11-22 ENCOUNTER — Encounter (INDEPENDENT_AMBULATORY_CARE_PROVIDER_SITE_OTHER): Payer: Self-pay | Admitting: Family Medicine

## 2020-11-22 VITALS — BP 147/54 | HR 64 | Temp 98.1°F | Ht 66.0 in | Wt 203.0 lb

## 2020-11-22 DIAGNOSIS — R030 Elevated blood-pressure reading, without diagnosis of hypertension: Secondary | ICD-10-CM

## 2020-11-22 DIAGNOSIS — Z6836 Body mass index (BMI) 36.0-36.9, adult: Secondary | ICD-10-CM

## 2020-11-22 DIAGNOSIS — K5900 Constipation, unspecified: Secondary | ICD-10-CM | POA: Diagnosis not present

## 2020-11-22 DIAGNOSIS — E1169 Type 2 diabetes mellitus with other specified complication: Secondary | ICD-10-CM | POA: Diagnosis not present

## 2020-11-28 DIAGNOSIS — M48061 Spinal stenosis, lumbar region without neurogenic claudication: Secondary | ICD-10-CM | POA: Diagnosis not present

## 2020-11-28 DIAGNOSIS — M5416 Radiculopathy, lumbar region: Secondary | ICD-10-CM | POA: Diagnosis not present

## 2020-11-28 DIAGNOSIS — M47816 Spondylosis without myelopathy or radiculopathy, lumbar region: Secondary | ICD-10-CM | POA: Diagnosis not present

## 2020-11-28 NOTE — Progress Notes (Signed)
Chief Complaint:   OBESITY [name] is here to discuss her progress with her obesity treatment plan along with follow-up of her obesity related diagnoses. [name] is on keeping a food journal and adhering to recommended goals of 1300-1500 calories and 95 grams of protein daily and states she is following her eating plan approximately 90% of the time. [name] states she is doing 0 minutes 0 times per week.  Today's visit was #: 34 Starting weight: 220 lbs Starting date: 01/19/2018 Today's weight: 203 lbs Today's date: 11/22/2020 Total lbs lost to date: 17 Total lbs lost since last in-office visit: 1  Interim History: [name] continues to do well with weight loss. She has a birthday coming up and she has already made some damage control strategies. Her hunger is mostly control and she is doing well with increasing lean protein and vegetables.  Subjective:   1. Constipation, unspecified constipation type [name] had issues with constipation on Ozempic and she stopped the medicine. She feels better now.  2. Type 2 diabetes mellitus with other specified complication, without long-term current use of insulin ([place]) [name] tried Ozempic but she stopped due to side effects. She continues to work on diet and exercise, and she shows no signs of hypoglycemia.  3. Blood pressure elevated without history of HTN [name]'s blood pressure is above goal today, and may be due to rushing to get to her appointment on time.  Assessment/Plan:   1. Constipation, unspecified constipation type [name] was informed that a decrease in bowel movement frequency is normal while losing weight, but stools should not be hard or painful. Djeneba will contin to increase her water and fiber foods in her diet. Orders and follow up as documented in patient record.   2. Type 2 diabetes mellitus with other specified complication, without long-term current use of insulin ([place]) [name] will  continue diet, exercise, and weight loss, and will continue to follow up as directed. Good blood sugar control is important to decrease the likelihood of diabetic complications such as nephropathy, neuropathy, limb loss, blindness, coronary artery disease, and death. Intensive lifestyle modification including diet, exercise and weight loss are the first line of treatment for diabetes.   3. Blood pressure elevated without history of HTN [name] will continue diet and exercise, and we will recheck her blood pressure in 3 weeks. If her blood pressure remains elevated then she may need to have her medications adjusted.  4. Obesity with current BMI 32.9 [name] is currently in the action stage of change. As such, her goal is to continue with weight loss efforts. She has agreed to keeping a food journal and adhering to recommended goals of 1300-1500 calories and 95+ grams of protein daily.   Behavioral modification strategies: increasing lean protein intake.  [name] has agreed to follow-up with our clinic in 3 weeks. She was informed of the importance of frequent follow-up visits to maximize her success with intensive lifestyle modifications for her multiple health conditions.   Objective:   Blood pressure (!) 147/54, pulse 64, temperature 98.1 F (36.7 C), height 5\' 6"  (1.676 m), weight 203 lb (92.1 kg), SpO2 98 %. Body mass index is 32.77 kg/m.  General: Cooperative, alert, well developed, in no acute distress. HEENT: Conjunctivae and lids unremarkable. Cardiovascular: Regular rhythm.  Lungs: Normal work of breathing. Neurologic: No focal deficits.   Lab Results  Component Value Date   CREATININE 0.69 10/24/2020   BUN 19 10/24/2020   NA 132 (L) 10/24/2020   K  4.7 10/24/2020   CL 95 (L) 10/24/2020   CO2 23 10/24/2020   Lab Results  Component Value Date   ALT 19 08/15/2020   AST 33 08/15/2020   ALKPHOS 149 (H) 08/15/2020   BILITOT 0.5 08/15/2020   Lab Results  Component  Value Date   HGBA1C 5.7 (H) 04/20/2020   HGBA1C 5.9 (A) 12/09/2019   HGBA1C 6.1 06/08/2019   HGBA1C 5.7 (A) 05/24/2019   HGBA1C 5.7 (A) 01/05/2019   Lab Results  Component Value Date   INSULIN 13.1 04/20/2020   INSULIN 12.1 01/19/2018   Lab Results  Component Value Date   TSH 3.38 06/04/2018   Lab Results  Component Value Date   CHOL 141 04/20/2020   HDL 65 04/20/2020   LDLCALC 63 04/20/2020   LDLDIRECT 58.0 11/25/2017   TRIG 66 04/20/2020   CHOLHDL 2.2 04/20/2020   Lab Results  Component Value Date   WBC 10.9 (H) 06/09/2020   HGB 12.9 06/09/2020   HCT 39.0 06/09/2020   MCV 87.6 06/09/2020   PLT 338 06/09/2020   No results found for: IRON, TIBC, FERRITIN  Attestation Statements:   Reviewed by clinician on day of visit: allergies, medications, problem list, medical history, surgical history, family history, social history, and previous encounter notes.  Time spent on visit including pre-visit chart review and post-visit care and charting was 30 minutes.    I, Trixie Dredge, am acting as transcriptionist for Dennard Nip, MD.  I have reviewed the above documentation for accuracy and completeness, and I agree with the above. -  Dennard Nip, MD

## 2020-11-30 ENCOUNTER — Ambulatory Visit (INDEPENDENT_AMBULATORY_CARE_PROVIDER_SITE_OTHER): Payer: Medicare PPO | Admitting: Pharmacist

## 2020-11-30 ENCOUNTER — Other Ambulatory Visit: Payer: Self-pay

## 2020-11-30 VITALS — BP 140/62 | HR 70

## 2020-11-30 DIAGNOSIS — E1159 Type 2 diabetes mellitus with other circulatory complications: Secondary | ICD-10-CM | POA: Diagnosis not present

## 2020-11-30 DIAGNOSIS — I152 Hypertension secondary to endocrine disorders: Secondary | ICD-10-CM

## 2020-11-30 NOTE — Patient Instructions (Signed)
Please continue clonidine 0.1mg  daily at bedtime (for hot flashes), irbesartan 300mg  daily and amlodipine 10mg  daily.  Try to start exercising as tolerated.  Call me at [phone] with any questions

## 2020-11-30 NOTE — Progress Notes (Signed)
Patient ID: LACORA FOLMER                 DOB: 08/26/1950                      MRN: [id number]     HPI: [name] is a 70 y.o. female referred by Dr. Johney Frame to HTN clinic. PMH is significant for diabetes, hyperlipidemia, OSA on CPAP, aortic valve replacement with bioprosthetic bovine pericardial tissue valve 08/23/16, HTN, obesity, non-obstructive CAD, diastolic CHF and TIA. She is currently following in the [facility]. On 09/12/20 her blood pressure was 190/62. Losartan was increased from 45m to 1056mand HCTZ 2561maily was added on 09/14/20. Blood pressures were still elevated after this change, therefore amlodipine 31m45mily was added on 09/26/20.  At last HTN clinic visit losartan was changed to irbesartan 300mg59mly. Her home BP's ranged between 120-1655-374'Molic. [facility]. Home cuff compared to clinic cuff: Home wrist cuff read 152/71, clinic manual cuff 160/68, clinic automatic 156/70.  Patient presents today for follow up. She denies dizziness, lightheadedness, headache, blurred vision, SOB and swelling. She has been compliant with her medications. She stopped Ozempic due to constipation. She brings a list of home readings that are almost all at goal. Rechecked her home cuff today. Home (wrist) cuff read 131/72 clinic upper arm cuff 140/62. It appears home cuff is about 8-9 points systolic lower at home.   She got a back injection a few weeks ago and her pain in a lot better. Wants to start going to the gym again.  Current HTN meds: clonidine 0.1mg d46my at bedtime (for hot flashes), irbesartan 300mg d64m, amlodipine 31mg da41mPreviously tried:HCTZ (should avoid due to hyponatremia) BP goal: <130/80  Family History: The patient's family history includes Alcohol abuse in her father; Anxiety disorder in her mother; Breast cancer in her maternal grandmother; COPD in her mother; Colon polyps in her  maternal aunt and mother; Diabetes in her paternal uncle; Gallbladder disease in her maternaTl grandmother; Heart disease in her father; Irritable bowel syndrome in her mother. There is no history of Other, Colon cancer, Esophageal cancer, Rectal cancer, or Stomach cancer.  Social History: No ETOH use, no tobacco use, no illicit drugs  Diet: takes salt tabs 16oz bottle of coke zero unsweet tea at lunch if she is out to lunch  Exercise: just got 2 back injections- has helped pain. Wants to start going to the gym and using the new step  Home BP readings: 113/70, 122/65, 121/66, 136/72, 117/73, 131/62, 116/71, 99/60, 125/65, 108/56, 113/63, 123/70, 116/58  Wt Readings from Last 3 Encounters:  11/22/20 203 lb (92.1 kg)  11/08/20 204 lb (92.5 kg)  10/24/20 203 lb (92.1 kg)   BP Readings from Last 3 Encounters:  11/22/20 (!) 147/54  11/09/20 (!) 140/58  11/08/20 (!) 161/71   Pulse Readings from Last 3 Encounters:  11/22/20 64  11/09/20 70  11/08/20 67    Renal function: CrCl cannot be calculated (Patient's most recent lab result is older than the maximum 21 days allowed.).  Past Medical History:  Diagnosis Date   Allergy    Anxiety    Arthritis    back & knee   Asthma     mild per pt shows up with resp illness   Colon polyps    Constipation    Diabetes mellitus without complication (HCC)[place]  Dyspnea    Gallstones    Gastric polyps    Gastroparesis    GERD (gastroesophageal reflux disease)    Headache    sinus headaches and migraines at times   Heart murmur    History of migraine headaches    HTN (hypertension)    Hyperlipidemia    IBS (irritable bowel syndrome)    Joint pain    Lumbar disc disease    S/P aortic valve replacement with bioprosthetic valve 08/23/2016   25 mm Edwards Intuity rapid-deployment bovine pericardial tissue valve via partial upper mini sternotomy   Sleep apnea    CPAP   TIA (transient ischemic attack)     Current Outpatient Medications on  File Prior to Visit  Medication Sig Dispense Refill   ACCU-CHEK GUIDE test strip USE TO TEST BLOOD SUGARS DAILY. DX: E11.9 100 strip 12   Accu-Chek Softclix Lancets lancets USE AS INSTRUCTED 100 each 12   amLODipine (NORVASC) 10 MG tablet Take 10 mg by mouth daily. 90 tablet 1   atorvastatin (LIPITOR) 40 MG tablet TAKE 1 TABLET (40 MG TOTAL) BY MOUTH DAILY AT 6 PM. 90 tablet 3   Biotin 5000 MCG TABS Take 5,000 mcg by mouth daily.      blood glucose meter kit and supplies KIT Dispense based on patient and insurance preference. Use up to four times daily as directed. Dx: E11.9 1 each 0   calcium-vitamin D (OSCAL WITH D) 500-200 MG-UNIT per tablet Take 2 tablets by mouth daily.      Cholecalciferol (VITAMIN D) 125 MCG (5000 UT) CAPS Take 5,000 Units by mouth daily.     cloNIDine (CATAPRES) 0.1 MG tablet Take 1 tablet (0.1 mg total) by mouth at bedtime. 90 tablet 2   clopidogrel (PLAVIX) 75 MG tablet TAKE 1 TABLET BY MOUTH EVERY DAY 90 tablet 3   demeclocycline (DECLOMYCIN) 300 MG tablet TAKE 1 TABLET (300 MG TOTAL) BY MOUTH 2 (TWO) TIMES DAILY. 180 tablet 3   docusate sodium (COLACE) 100 MG capsule Take 1 capsule (100 mg total) by mouth 2 (two) times daily. 60 capsule 0   fluticasone (FLONASE) 50 MCG/ACT nasal spray Place 2 sprays into both nostrils daily.      ipratropium (ATROVENT) 0.03 % nasal spray 2 SPRAYS IN EACH NOSTRIL AS NEEDED THREE TIMES AS NEEDED     irbesartan (AVAPRO) 300 MG tablet Take one tablet by mouth daily. STOP LOSARTAN 90 tablet 3   loratadine (CLARITIN) 10 MG tablet Take 10 mg by mouth at bedtime.      Melatonin 5 MG TABS Take by mouth.      metFORMIN (GLUCOPHAGE) 500 MG tablet Take 500 mg by mouth daily with breakfast.     metoCLOPramide (REGLAN) 10 MG tablet TAKE 1/2 TABLET BY MOUTH 4 TIMES A DAY 60 tablet 3   Multiple Vitamin (MULTIVITAMIN WITH MINERALS) TABS tablet Take 1 tablet by mouth daily.     naproxen sodium (ALEVE) 220 MG tablet Take 220 mg by mouth.      OMEPRAZOLE MAGNESIUM PO Take 40 mg by mouth daily with breakfast.      ondansetron (ZOFRAN ODT) 4 MG disintegrating tablet Take 1 tablet (4 mg total) by mouth every 8 (eight) hours as needed. 10 tablet 0   Probiotic Product (ALIGN) 4 MG CAPS Take 4 mg by mouth daily.      psyllium (METAMUCIL SMOOTH TEXTURE) 28 % packet Take 1 packet by mouth daily before breakfast.     Semaglutide,0.25 or 0.5MG/DOS, (  OZEMPIC, 0.25 OR 0.5 MG/DOSE,) 2 MG/1.5ML SOPN Inject 0.25 mg into the skin once a week. 1.5 mL 0   sodium chloride 1 g tablet Take 6 g by mouth daily.     venlafaxine XR (EFFEXOR-XR) 75 MG 24 hr capsule TAKE 1 CAPSULE (75 MG TOTAL) BY MOUTH DAILY WITH BREAKFAST. 90 capsule 2   No current facility-administered medications on file prior to visit.    Allergies  Allergen Reactions   Augmentin [Amoxicillin-Pot Clavulanate] Rash    rash   Erythromycin Nausea Only   Penicillins Itching and Rash    Has patient had a PCN reaction causing immediate rash, facial/tongue/throat swelling, SOB or lightheadedness with hypotension: No Has patient had a PCN reaction causing severe rash involving mucus membranes or skin necrosis: No Has patient had a PCN reaction that required hospitalization: No Has patient had a PCN reaction occurring within the last 10 years: No  If all of the above answers are "NO", then may proceed with Cephalosporin use.     There were no vitals taken for this visit.   Assessment/Plan:  1. Hypertension - Blood pressure is above goal of <130/80 in clinic today. Her home readings, after adjusting for the 8-9lb adjustment for home readings, she still had a majority of readings at home at goal. Will continue amlodipine 39m daily and irbesartan 3087mdaily for now. Patient to start exercising. Follow up in 4 weeks.    Thank you  MeRamond DialPharm.D, BCPS, CPP [id number]. Ch598 Franklin StreetGrGustineNC [address]Phone: [phone]: [phone]

## 2020-12-01 ENCOUNTER — Other Ambulatory Visit (INDEPENDENT_AMBULATORY_CARE_PROVIDER_SITE_OTHER): Payer: Self-pay | Admitting: Physician Assistant

## 2020-12-01 DIAGNOSIS — E1169 Type 2 diabetes mellitus with other specified complication: Secondary | ICD-10-CM

## 2020-12-04 NOTE — Telephone Encounter (Signed)
Dr.Beasley 

## 2020-12-05 NOTE — Telephone Encounter (Signed)
Patient is requesting a refill of the following medications: Requested Prescriptions   Pending Prescriptions Disp Refills   OZEMPIC, 0.25 OR 0.5 MG/DOSE, 2 MG/1.5ML SOPN [Pharmacy Med Name: OZEMPIC 0.25-0.5 MG/DOSE PEN]      Sig: INJECT 0.25MG  INTO THE SKIN ONE TIME PER WEEK    Last office visit: 11/22/20 Date of last refill: 10/24/20 Last refill amount: 1.5 Follow up time period per chart: 3 week Next scheduled appt:12/19/20

## 2020-12-09 DIAGNOSIS — G4733 Obstructive sleep apnea (adult) (pediatric): Secondary | ICD-10-CM | POA: Diagnosis not present

## 2020-12-14 ENCOUNTER — Ambulatory Visit: Payer: Medicare PPO | Admitting: Family Medicine

## 2020-12-19 ENCOUNTER — Ambulatory Visit (INDEPENDENT_AMBULATORY_CARE_PROVIDER_SITE_OTHER): Payer: Medicare PPO | Admitting: Physician Assistant

## 2020-12-19 ENCOUNTER — Encounter (INDEPENDENT_AMBULATORY_CARE_PROVIDER_SITE_OTHER): Payer: Self-pay | Admitting: Physician Assistant

## 2020-12-19 ENCOUNTER — Other Ambulatory Visit: Payer: Self-pay

## 2020-12-19 VITALS — BP 133/69 | HR 73 | Temp 98.5°F | Ht 66.0 in | Wt 206.0 lb

## 2020-12-19 DIAGNOSIS — E669 Obesity, unspecified: Secondary | ICD-10-CM | POA: Diagnosis not present

## 2020-12-19 DIAGNOSIS — E1169 Type 2 diabetes mellitus with other specified complication: Secondary | ICD-10-CM

## 2020-12-19 DIAGNOSIS — I10 Essential (primary) hypertension: Secondary | ICD-10-CM | POA: Diagnosis not present

## 2020-12-19 DIAGNOSIS — Z6833 Body mass index (BMI) 33.0-33.9, adult: Secondary | ICD-10-CM | POA: Diagnosis not present

## 2020-12-20 DIAGNOSIS — M5416 Radiculopathy, lumbar region: Secondary | ICD-10-CM | POA: Diagnosis not present

## 2020-12-20 DIAGNOSIS — M48061 Spinal stenosis, lumbar region without neurogenic claudication: Secondary | ICD-10-CM | POA: Diagnosis not present

## 2020-12-21 NOTE — Progress Notes (Signed)
Chief Complaint:   OBESITY [name] is here to discuss her progress with her obesity treatment plan along with follow-up of her obesity related diagnoses. [name] is on keeping a food journal and adhering to recommended goals of 1300-1500 calories and 95+ grams of protein daily and states she is following her eating plan approximately 80% of the time. [name] states she is doing exercises for 15-20 minutes 7 times per week.  Today's visit was #: 58 Starting weight: 220 lbs Starting date: 01/19/2018 Today's weight: 206 lbs Today's date: 12/19/2020 Total lbs lost to date: 14 Total lbs lost since last in-office visit: 0  Interim History: [name] is not journaling daily. She had a birthday recently and she feels she may have overindulged. She has an appointment tomorrow for an epidural injection.  Subjective:   1. Type 2 diabetes mellitus with other specified complication, without long-term current use of insulin (HCC) [name]'s fasting BGs ranges between 133-154. She is on metformin, and she could not tolerate Ozempic.  2. Essential hypertension [name] is seeing the Hypertension clinic. Her blood pressure is much improved today.  Assessment/Plan:   1. Type 2 diabetes mellitus with other specified complication, without long-term current use of insulin (HCC) [name] will continue her medications and weight loss. Good blood sugar control is important to decrease the likelihood of diabetic complications such as nephropathy, neuropathy, limb loss, blindness, coronary artery disease, and death. Intensive lifestyle modification including diet, exercise and weight loss are the first line of treatment for diabetes.   2. Essential hypertension [name] will working on healthy weight loss and exercise to improve blood pressure control. We will watch for signs of hypotension as she continues her lifestyle modifications. She will continue to follow up with the hypertension  clinic.  3. Class 1 obesity with serious comorbidity and body mass index (BMI) of 33.0 to 33.9 in adult, unspecified obesity type [name] is currently in the action stage of change. As such, her goal is to continue with weight loss efforts. She has agreed to keeping a food journal and adhering to recommended goals of 1300-1400 calories and 100 grams of protein daily.   Exercise goals: As is, and start water aerobics.  Behavioral modification strategies: planning for success and keeping a strict food journal.  [name] has agreed to follow-up with our clinic in 2 weeks. She was informed of the importance of frequent follow-up visits to maximize her success with intensive lifestyle modifications for her multiple health conditions.   Objective:   Blood pressure 133/69, pulse 73, temperature 98.5 F (36.9 C), height 5\' 6"  (1.676 m), weight 206 lb (93.4 kg), SpO2 99 %. Body mass index is 33.25 kg/m.  General: Cooperative, alert, well developed, in no acute distress. HEENT: Conjunctivae and lids unremarkable. Cardiovascular: Regular rhythm.  Lungs: Normal work of breathing. Neurologic: No focal deficits.   Lab Results  Component Value Date   CREATININE 0.69 10/24/2020   BUN 19 10/24/2020   NA 132 (L) 10/24/2020   K 4.7 10/24/2020   CL 95 (L) 10/24/2020   CO2 23 10/24/2020   Lab Results  Component Value Date   ALT 19 08/15/2020   AST 33 08/15/2020   ALKPHOS 149 (H) 08/15/2020   BILITOT 0.5 08/15/2020   Lab Results  Component Value Date   HGBA1C 5.7 (H) 04/20/2020   HGBA1C 5.9 (A) 12/09/2019   HGBA1C 6.1 06/08/2019   HGBA1C 5.7 (A) 05/24/2019   HGBA1C 5.7 (A) 01/05/2019   Lab Results  Component  Value Date   INSULIN 13.1 04/20/2020   INSULIN 12.1 01/19/2018   Lab Results  Component Value Date   TSH 3.38 06/04/2018   Lab Results  Component Value Date   CHOL 141 04/20/2020   HDL 65 04/20/2020   LDLCALC 63 04/20/2020   LDLDIRECT 58.0 11/25/2017   TRIG 66  04/20/2020   CHOLHDL 2.2 04/20/2020   Lab Results  Component Value Date   VD25OH 66.5 04/20/2020   VD25OH 52.21 06/08/2019   VD25OH 56.90 01/05/2019   Lab Results  Component Value Date   WBC 10.9 (H) 06/09/2020   HGB 12.9 06/09/2020   HCT 39.0 06/09/2020   MCV 87.6 06/09/2020   PLT 338 06/09/2020   No results found for: IRON, TIBC, FERRITIN  Obesity Behavioral Intervention:   Approximately 15 minutes were spent on the discussion below.  ASK: We discussed the diagnosis of obesity with [name] today and [name] agreed to give [name] permission to discuss obesity behavioral modification therapy today.  ASSESS: [name] has the diagnosis of obesity and her BMI today is 33.27. [name] is in the action stage of change.   ADVISE: [name] was educated on the multiple health risks of obesity as well as the benefit of weight loss to improve her health. She was advised of the need for long term treatment and the importance of lifestyle modifications to improve her current health and to decrease her risk of future health problems.  AGREE: Multiple dietary modification options and treatment options were discussed and [name] agreed to follow the recommendations documented in the above note.  ARRANGE: [name] was educated on the importance of frequent visits to treat obesity as outlined per CMS and USPSTF guidelines and agreed to schedule her next follow up appointment today.  Attestation Statements:   Reviewed by clinician on day of visit: allergies, medications, problem list, medical history, surgical history, family history, social history, and previous encounter notes.   [name], am acting as transcriptionist for [employer], PA-C.  I have reviewed the above documentation for accuracy and completeness, and I agree with the above. [name], PA-C

## 2020-12-28 ENCOUNTER — Ambulatory Visit (INDEPENDENT_AMBULATORY_CARE_PROVIDER_SITE_OTHER): Payer: Medicare PPO | Admitting: Pharmacist

## 2020-12-28 ENCOUNTER — Other Ambulatory Visit: Payer: Self-pay

## 2020-12-28 VITALS — BP 124/60 | HR 71

## 2020-12-28 DIAGNOSIS — E1159 Type 2 diabetes mellitus with other circulatory complications: Secondary | ICD-10-CM

## 2020-12-28 DIAGNOSIS — I152 Hypertension secondary to endocrine disorders: Secondary | ICD-10-CM

## 2020-12-28 NOTE — Patient Instructions (Addendum)
Keep working on exercise! Increase as able  Continue irbesartan 300mg  daily and amlodipine 10mg  daily  Call me at [phone] with any issues

## 2020-12-28 NOTE — Progress Notes (Signed)
Patient ID: [name]                 DOB: 28-Jan-1951                      MRN: [id number]     HPI: [name] is a 70 y.o. female referred by Dr. Johney Frame to HTN clinic. PMH is significant for diabetes, hyperlipidemia, OSA on CPAP, aortic valve replacement with bioprosthetic bovine pericardial tissue valve 08/23/16, HTN, obesity, non-obstructive CAD, diastolic CHF and TIA. She is currently following in the health weight and wellness clinic. On 09/12/20 her blood pressure was 190/62. Losartan was increased from 69m to 1015mand HCTZ 2583maily was added on 09/14/20. Blood pressures were still elevated after this change, therefore amlodipine 50m44mily was added on 09/26/20.  At last HTN clinic visit patients blood pressure was mostly at goal. No medication changes were made. Patient was to start exercising.   Home wrist cuff read 152/71, clinic manual cuff 160/68, clinic automatic 156/70.  Patient presents today for follow up. She denies dizziness, lightheadedness (unless she stands up too fast), headache, blurred vision, SOB and swelling. She has been compliant with her medications. She brings a list of home readings that are almost all at goal even when adjusting for home cuff being 9 pt lower.   She has started walking indoors 3 days a week. Wants to start doing water aerobics.  Current HTN meds: clonidine 0.1mg 17mly at bedtime (for hot flashes), irbesartan 300mg 33my, amlodipine 50mg d38m Previously tried:HCTZ (should avoid due to hyponatremia) BP goal: <130/80  Family History: The patient's family history includes Alcohol abuse in her father; Anxiety disorder in her mother; Breast cancer in her maternal grandmother; COPD in her mother; Colon polyps in her maternal aunt and mother; Diabetes in her paternal uncle; Gallbladder disease in her maternaTl grandmother; Heart disease in her father; Irritable bowel syndrome in her mother. There is no history of Other, Colon cancer,  Esophageal cancer, Rectal cancer, or Stomach cancer.  Social History: No ETOH use, no tobacco use, no illicit drugs  Diet: takes salt tabs 16oz bottle of coke zero unsweet tea at lunch if she is out to lunch  Exercise: just got 2 back injections- has helped pain. Walking indoors 3 times a week Home BP readings: 131/62, 116/71, 99/60, 125/65, 108/56, 113/63, 123/70, 116/58, 135/77, 121/68, 120/71, 105/61, 112/59, 124/70, 124/72, 129/67, 103/60, 123/70, 131/63, 120/66, 122/66, 110/62, 152/74, 144/72 Wt Readings from Last 3 Encounters:  12/19/20 206 lb (93.4 kg)  11/22/20 203 lb (92.1 kg)  11/08/20 204 lb (92.5 kg)   BP Readings from Last 3 Encounters:  12/19/20 133/69  11/30/20 140/62  11/22/20 (!) 147/54   Pulse Readings from Last 3 Encounters:  12/19/20 73  11/30/20 70  11/22/20 64    Renal function: CrCl cannot be calculated (Patient's most recent lab result is older than the maximum 21 days allowed.).  Past Medical History:  Diagnosis Date   Allergy    Anxiety    Arthritis    back & knee   Asthma     mild per pt shows up with resp illness   Colon polyps    Constipation    Diabetes mellitus without complication (HCC)    Dyspnea    Gallstones    Gastric polyps    Gastroparesis    GERD (gastroesophageal reflux disease)    Headache    sinus headaches and migraines at times  Heart murmur    History of migraine headaches    HTN (hypertension)    Hyperlipidemia    IBS (irritable bowel syndrome)    Joint pain    Lumbar disc disease    S/P aortic valve replacement with bioprosthetic valve 08/23/2016   25 mm [employer] rapid-deployment bovine pericardial tissue valve via partial upper mini sternotomy   Sleep apnea    CPAP   TIA (transient ischemic attack)     Current Outpatient Medications on File Prior to Visit  Medication Sig Dispense Refill   ACCU-CHEK GUIDE test strip USE TO TEST BLOOD SUGARS DAILY. DX: E11.9 100 strip 12   Accu-Chek Softclix Lancets  lancets USE AS INSTRUCTED 100 each 12   amLODipine (NORVASC) 10 MG tablet Take 10 mg by mouth daily. 90 tablet 1   atorvastatin (LIPITOR) 40 MG tablet TAKE 1 TABLET (40 MG TOTAL) BY MOUTH DAILY AT 6 PM. 90 tablet 3   Biotin 5000 MCG TABS Take 5,000 mcg by mouth daily.      blood glucose meter kit and supplies KIT Dispense based on patient and insurance preference. Use up to four times daily as directed. Dx: E11.9 1 each 0   calcium-vitamin D (OSCAL WITH D) 500-200 MG-UNIT per tablet Take 2 tablets by mouth daily.      Cholecalciferol (VITAMIN D) 125 MCG (5000 UT) CAPS Take 5,000 Units by mouth daily.     cloNIDine (CATAPRES) 0.1 MG tablet Take 1 tablet (0.1 mg total) by mouth at bedtime. 90 tablet 2   clopidogrel (PLAVIX) 75 MG tablet TAKE 1 TABLET BY MOUTH EVERY DAY 90 tablet 3   demeclocycline (DECLOMYCIN) 300 MG tablet TAKE 1 TABLET (300 MG TOTAL) BY MOUTH 2 (TWO) TIMES DAILY. 180 tablet 3   docusate sodium (COLACE) 100 MG capsule Take 1 capsule (100 mg total) by mouth 2 (two) times daily. 60 capsule 0   fluticasone (FLONASE) 50 MCG/ACT nasal spray Place 2 sprays into both nostrils daily.      ipratropium (ATROVENT) 0.03 % nasal spray 2 SPRAYS IN EACH NOSTRIL AS NEEDED THREE TIMES AS NEEDED     irbesartan (AVAPRO) 300 MG tablet Take one tablet by mouth daily. STOP LOSARTAN 90 tablet 3   loratadine (CLARITIN) 10 MG tablet Take 10 mg by mouth at bedtime.      Melatonin 5 MG TABS Take by mouth.      metFORMIN (GLUCOPHAGE) 500 MG tablet Take 500 mg by mouth daily with breakfast.     metoCLOPramide (REGLAN) 10 MG tablet TAKE 1/2 TABLET BY MOUTH 4 TIMES A DAY 60 tablet 3   Multiple Vitamin (MULTIVITAMIN WITH MINERALS) TABS tablet Take 1 tablet by mouth daily.     naproxen sodium (ALEVE) 220 MG tablet Take 220 mg by mouth.     OMEPRAZOLE MAGNESIUM PO Take 40 mg by mouth daily with breakfast.      ondansetron (ZOFRAN ODT) 4 MG disintegrating tablet Take 1 tablet (4 mg total) by mouth every 8 (eight)  hours as needed. 10 tablet 0   OZEMPIC, 0.25 OR 0.5 MG/DOSE, 2 MG/1.5ML SOPN INJECT 0.25MG INTO THE SKIN ONE TIME PER WEEK (Patient not taking: Reported on 12/19/2020) 1.5 mL 0   Probiotic Product (ALIGN) 4 MG CAPS Take 4 mg by mouth daily.      psyllium (METAMUCIL SMOOTH TEXTURE) 28 % packet Take 1 packet by mouth daily before breakfast.     sodium chloride 1 g tablet Take 6 g by mouth daily.  venlafaxine XR (EFFEXOR-XR) 75 MG 24 hr capsule TAKE 1 CAPSULE (75 MG TOTAL) BY MOUTH DAILY WITH BREAKFAST. 90 capsule 2   No current facility-administered medications on file prior to visit.    Allergies  Allergen Reactions   Augmentin [Amoxicillin-Pot Clavulanate] Rash    rash   Erythromycin Nausea Only   Penicillins Itching and Rash    Has patient had a PCN reaction causing immediate rash, facial/tongue/throat swelling, SOB or lightheadedness with hypotension: No Has patient had a PCN reaction causing severe rash involving mucus membranes or skin necrosis: No Has patient had a PCN reaction that required hospitalization: No Has patient had a PCN reaction occurring within the last 10 years: No  If all of the above answers are "NO", then may proceed with Cephalosporin use.     There were no vitals taken for this visit.   Assessment/Plan: Blood pressure is at goal of <130/80 in clinic today. Her home readings, after adjusting for the 8-9lb adjustment for home monitor, are still at goal. Will continue amlodipine 25m daily and irbesartan 3028mdaily. Patient to continue exercising and increase as able. Follow up as needed.    Thank you  MeRamond DialPharm.D, BCPS, CPP [id number]. [id number] Catherine LaneGrLiberty LakeNC [address]Phone: [phone]: [phone]

## 2021-01-02 ENCOUNTER — Other Ambulatory Visit: Payer: Self-pay

## 2021-01-02 ENCOUNTER — Ambulatory Visit (INDEPENDENT_AMBULATORY_CARE_PROVIDER_SITE_OTHER): Payer: Medicare PPO | Admitting: Physician Assistant

## 2021-01-02 ENCOUNTER — Encounter (INDEPENDENT_AMBULATORY_CARE_PROVIDER_SITE_OTHER): Payer: Self-pay | Admitting: Physician Assistant

## 2021-01-02 VITALS — BP 123/65 | HR 71 | Temp 98.6°F | Ht 66.0 in | Wt 203.0 lb

## 2021-01-02 DIAGNOSIS — Z6833 Body mass index (BMI) 33.0-33.9, adult: Secondary | ICD-10-CM | POA: Diagnosis not present

## 2021-01-02 DIAGNOSIS — E1169 Type 2 diabetes mellitus with other specified complication: Secondary | ICD-10-CM | POA: Diagnosis not present

## 2021-01-02 DIAGNOSIS — E669 Obesity, unspecified: Secondary | ICD-10-CM

## 2021-01-09 NOTE — Progress Notes (Signed)
Chief Complaint:   OBESITY [name] is here to discuss her progress with her obesity treatment plan along with follow-up of her obesity related diagnoses. [name] is on keeping a food journal and adhering to recommended goals of 1300-1400 calories and 100 grams of protein daily and states she is following her eating plan approximately 95% of the time. [name] states she is walking for 15-20 minutes 3 times per week.  Today's visit was #: 73 Starting weight: 220 lbs Starting date: 01/19/2018 Today's weight: 203 lbs Today's date: 01/02/2021 Total lbs lost to date: 17 Total lbs lost since last in-office visit: 3  Interim History: [name] did well with weight loss. She is doing a good job keeping her calories between 1300-1400 daily. She changed to grilled shrimp at [facility], which is keeping her full.  Subjective:   1. Type 2 diabetes mellitus with other specified complication, without long-term current use of insulin (HCC) [name]'s fasting BGs range between 132 and 152. She is on metformin, and she denies hypoglycemia. She did not tolerate Ozempic due to side effects.  Assessment/Plan:   1. Type 2 diabetes mellitus with other specified complication, without long-term current use of insulin (HCC) [name] will continue metformin. Good blood sugar control is important to decrease the likelihood of diabetic complications such as nephropathy, neuropathy, limb loss, blindness, coronary artery disease, and death. Intensive lifestyle modification including diet, exercise and weight loss are the first line of treatment for diabetes.   2. Class 1 obesity with serious comorbidity and body mass index (BMI) of 33.0 to 33.9 in adult, unspecified obesity type [name] is currently in the action stage of change. As such, her goal is to continue with weight loss efforts. She has agreed to keeping a food journal and adhering to recommended goals of 1300-1400 calories and 100 grams of  protein daily.   We will recheck fasting labs at her next visit.  Exercise goals: As is.  Behavioral modification strategies: no skipping meals and meal planning and cooking strategies.  [name] has agreed to follow-up with our clinic in 3 weeks. She was informed of the importance of frequent follow-up visits to maximize her success with intensive lifestyle modifications for her multiple health conditions.   Objective:   Blood pressure 123/65, pulse 71, temperature 98.6 F (37 C), height 5\' 6"  (1.676 m), weight 203 lb (92.1 kg), SpO2 98 %. Body mass index is 32.77 kg/m.  General: Cooperative, alert, well developed, in no acute distress. HEENT: Conjunctivae and lids unremarkable. Cardiovascular: Regular rhythm.  Lungs: Normal work of breathing. Neurologic: No focal deficits.   Lab Results  Component Value Date   CREATININE 0.69 10/24/2020   BUN 19 10/24/2020   NA 132 (L) 10/24/2020   K 4.7 10/24/2020   CL 95 (L) 10/24/2020   CO2 23 10/24/2020   Lab Results  Component Value Date   ALT 19 08/15/2020   AST 33 08/15/2020   ALKPHOS 149 (H) 08/15/2020   BILITOT 0.5 08/15/2020   Lab Results  Component Value Date   HGBA1C 5.7 (H) 04/20/2020   HGBA1C 5.9 (A) 12/09/2019   HGBA1C 6.1 06/08/2019   HGBA1C 5.7 (A) 05/24/2019   HGBA1C 5.7 (A) 01/05/2019   Lab Results  Component Value Date   INSULIN 13.1 04/20/2020   INSULIN 12.1 01/19/2018   Lab Results  Component Value Date   TSH 3.38 06/04/2018   Lab Results  Component Value Date   CHOL 141 04/20/2020   HDL 65 04/20/2020  LDLCALC 63 04/20/2020   LDLDIRECT 58.0 11/25/2017   TRIG 66 04/20/2020   CHOLHDL 2.2 04/20/2020   Lab Results  Component Value Date   VD25OH 66.5 04/20/2020   VD25OH 52.21 06/08/2019   VD25OH 56.90 01/05/2019   Lab Results  Component Value Date   WBC 10.9 (H) 06/09/2020   HGB 12.9 06/09/2020   HCT 39.0 06/09/2020   MCV 87.6 06/09/2020   PLT 338 06/09/2020   No results found for:  IRON, TIBC, FERRITIN  Obesity Behavioral Intervention:   Approximately 15 minutes were spent on the discussion below.  ASK: We discussed the diagnosis of obesity with [name] today and [name] agreed to give [name] permission to discuss obesity behavioral modification therapy today.  ASSESS: [name] has the diagnosis of obesity and her BMI today is 32.78. [name] is in the action stage of change.   ADVISE: [name] was educated on the multiple health risks of obesity as well as the benefit of weight loss to improve her health. She was advised of the need for long term treatment and the importance of lifestyle modifications to improve her current health and to decrease her risk of future health problems.  AGREE: Multiple dietary modification options and treatment options were discussed and [name] agreed to follow the recommendations documented in the above note.  ARRANGE: [name] was educated on the importance of frequent visits to treat obesity as outlined per CMS and USPSTF guidelines and agreed to schedule her next follow up appointment today.  Attestation Statements:   Reviewed by clinician on day of visit: allergies, medications, problem list, medical history, surgical history, family history, social history, and previous encounter notes.   Wilhemena Durie, am acting as transcriptionist for [employer], PA-C.  I have reviewed the above documentation for accuracy and completeness, and I agree with the above. [name], PA-C

## 2021-01-11 ENCOUNTER — Encounter: Payer: Self-pay | Admitting: Family Medicine

## 2021-01-11 ENCOUNTER — Ambulatory Visit: Payer: Medicare PPO | Admitting: Adult Health

## 2021-01-23 DIAGNOSIS — M48061 Spinal stenosis, lumbar region without neurogenic claudication: Secondary | ICD-10-CM | POA: Diagnosis not present

## 2021-01-25 ENCOUNTER — Encounter (INDEPENDENT_AMBULATORY_CARE_PROVIDER_SITE_OTHER): Payer: Self-pay | Admitting: Family Medicine

## 2021-01-25 ENCOUNTER — Other Ambulatory Visit: Payer: Self-pay

## 2021-01-25 ENCOUNTER — Ambulatory Visit (INDEPENDENT_AMBULATORY_CARE_PROVIDER_SITE_OTHER): Payer: Medicare PPO | Admitting: Family Medicine

## 2021-01-25 VITALS — BP 148/64 | HR 73 | Temp 98.8°F | Ht 66.0 in | Wt 204.0 lb

## 2021-01-25 DIAGNOSIS — E1159 Type 2 diabetes mellitus with other circulatory complications: Secondary | ICD-10-CM

## 2021-01-25 DIAGNOSIS — E1169 Type 2 diabetes mellitus with other specified complication: Secondary | ICD-10-CM

## 2021-01-25 DIAGNOSIS — Z6836 Body mass index (BMI) 36.0-36.9, adult: Secondary | ICD-10-CM

## 2021-01-25 DIAGNOSIS — E559 Vitamin D deficiency, unspecified: Secondary | ICD-10-CM | POA: Diagnosis not present

## 2021-01-25 DIAGNOSIS — I152 Hypertension secondary to endocrine disorders: Secondary | ICD-10-CM

## 2021-01-26 ENCOUNTER — Ambulatory Visit: Payer: Medicare PPO | Admitting: Adult Health

## 2021-01-26 ENCOUNTER — Encounter: Payer: Self-pay | Admitting: Adult Health

## 2021-01-26 VITALS — BP 116/70 | HR 64 | Temp 98.4°F | Ht 66.0 in | Wt 211.4 lb

## 2021-01-26 DIAGNOSIS — E669 Obesity, unspecified: Secondary | ICD-10-CM | POA: Diagnosis not present

## 2021-01-26 DIAGNOSIS — G4733 Obstructive sleep apnea (adult) (pediatric): Secondary | ICD-10-CM | POA: Diagnosis not present

## 2021-01-26 DIAGNOSIS — Z6832 Body mass index (BMI) 32.0-32.9, adult: Secondary | ICD-10-CM

## 2021-01-26 LAB — MAGNESIUM: Magnesium: 1.9 mg/dL (ref 1.6–2.3)

## 2021-01-26 LAB — VITAMIN D 25 HYDROXY (VIT D DEFICIENCY, FRACTURES): Vit D, 25-Hydroxy: 74.5 ng/mL (ref 30.0–100.0)

## 2021-01-26 LAB — INSULIN, RANDOM: INSULIN: 7.7 u[IU]/mL (ref 2.6–24.9)

## 2021-01-26 LAB — HEMOGLOBIN A1C
Est. average glucose Bld gHb Est-mCnc: 134 mg/dL
Hgb A1c MFr Bld: 6.3 % — ABNORMAL HIGH (ref 4.8–5.6)

## 2021-01-26 LAB — VITAMIN B12: Vitamin B-12: 1232 pg/mL (ref 232–1245)

## 2021-01-26 NOTE — Progress Notes (Signed)
@Patient  ID: [name], female    DOB: 1950-07-18, 70 y.o.   MRN: [id number]  Chief Complaint  Patient presents with   Follow-up    Referring provider: [name], MD  HPI: 70 year old female followed for obstructive sleep apnea Medical history significant for hypertension, diabetes, stroke, diastolic dysfunction, aortic valve replacement  TEST/EVENTS :  Home sleep study July 2017 AHI 19/hr   01/26/2021 Follow up : OSA  Patient presents for a follow-up visit.  She has underlying moderate obstructive sleep apnea is on nocturnal CPAP.  Patient says that she continues to wear her CPAP each night.  Getting around 7 to 8 hours.  Patient says she does feel rested however she absolutely hates the CPAP.  She says it is uncomfortable wakes her up intermittently and does not like wearing a mask.  She is very interested in the inspire device.  She would like to do anything to get off of the CPAP. CPAP download shows excellent compliance with 100% usage.  Daily average usage at 8 hours.  Patient is on auto CPAP 5 to 10 cm H2O.  AHI is 2.0. Patient says she is working on weight loss.  She is participating in the healthy weight and wellness program. Long discussion regarding the inspire device with patient education.  Also went over sleep apnea.  Patient has multiple comorbidities.  Says that she only wears her CPAP because she does not want to have more complications.   Allergies  Allergen Reactions   Augmentin [Amoxicillin-Pot Clavulanate] Rash    rash   Erythromycin Nausea Only   Penicillins Itching and Rash    Has patient had a PCN reaction causing immediate rash, facial/tongue/throat swelling, SOB or lightheadedness with hypotension: No Has patient had a PCN reaction causing severe rash involving mucus membranes or skin necrosis: No Has patient had a PCN reaction that required hospitalization: No Has patient had a PCN reaction occurring within the last 10 years: No  If all of  the above answers are "NO", then may proceed with Cephalosporin use.     Immunization History  Administered Date(s) Administered   Influenza Split 04/04/2011, 03/05/2012   Influenza Whole 04/16/2007, 03/16/2009, 03/13/2010   Influenza, High Dose Seasonal PF 01/31/2019, 02/27/2020, 02/28/2020   Influenza,inj,Quad PF,6+ Mos 03/30/2013, 03/10/2014, 03/23/2015, 02/27/2016   Influenza-Unspecified 02/27/2017, 02/28/2017, 01/31/2018   PFIZER Comirnaty(Gray Top)Covid-19 Tri-Sucrose Vaccine 11/17/2020   PFIZER(Purple Top)SARS-COV-2 Vaccination 08/15/2019, 09/08/2019, 03/31/2020   Pneumococcal Conjugate-13 05/21/2016   Pneumococcal Polysaccharide-23 05/23/2017   Td 06/24/1996, 08/06/2007, 11/07/2017   Tdap 11/07/2017   Zoster Recombinat (Shingrix) 01/21/2019, 03/24/2019   Zoster, Live 05/12/2014    Past Medical History:  Diagnosis Date   Allergy    Anxiety    Arthritis    back & knee   Asthma     mild per pt shows up with resp illness   Colon polyps    Constipation    Diabetes mellitus without complication (HCC)    Dyspnea    Gallstones    Gastric polyps    Gastroparesis    GERD (gastroesophageal reflux disease)    Headache    sinus headaches and migraines at times   Heart murmur    History of migraine headaches    HTN (hypertension)    Hyperlipidemia    IBS (irritable bowel syndrome)    Joint pain    Lumbar disc disease    S/P aortic valve replacement with bioprosthetic valve 08/23/2016   25 mm Edwards Intuity rapid-deployment  bovine pericardial tissue valve via partial upper mini sternotomy   Sleep apnea    CPAP   TIA (transient ischemic attack)     Tobacco History: Social History   Tobacco Use  Smoking Status Never  Smokeless Tobacco Never   Counseling given: Not Answered   Outpatient Medications Prior to Visit  Medication Sig Dispense Refill   ACCU-CHEK GUIDE test strip USE TO TEST BLOOD SUGARS DAILY. DX: E11.9 100 strip 12   Accu-Chek Softclix Lancets  lancets USE AS INSTRUCTED 100 each 12   amLODipine (NORVASC) 10 MG tablet Take 10 mg by mouth daily. 90 tablet 1   atorvastatin (LIPITOR) 40 MG tablet TAKE 1 TABLET (40 MG TOTAL) BY MOUTH DAILY AT 6 PM. 90 tablet 3   Biotin 5000 MCG TABS Take 5,000 mcg by mouth daily.      blood glucose meter kit and supplies KIT Dispense based on patient and insurance preference. Use up to four times daily as directed. Dx: E11.9 1 each 0   calcium-vitamin D (OSCAL WITH D) 500-200 MG-UNIT per tablet Take 2 tablets by mouth daily.      Cholecalciferol (VITAMIN D) 125 MCG (5000 UT) CAPS Take 5,000 Units by mouth daily.     cloNIDine (CATAPRES) 0.1 MG tablet Take 1 tablet (0.1 mg total) by mouth at bedtime. 90 tablet 2   clopidogrel (PLAVIX) 75 MG tablet TAKE 1 TABLET BY MOUTH EVERY DAY 90 tablet 3   demeclocycline (DECLOMYCIN) 300 MG tablet TAKE 1 TABLET (300 MG TOTAL) BY MOUTH 2 (TWO) TIMES DAILY. 180 tablet 3   docusate sodium (COLACE) 100 MG capsule Take 1 capsule (100 mg total) by mouth 2 (two) times daily. 60 capsule 0   fluticasone (FLONASE) 50 MCG/ACT nasal spray Place 2 sprays into both nostrils daily.      ipratropium (ATROVENT) 0.03 % nasal spray 2 SPRAYS IN EACH NOSTRIL AS NEEDED THREE TIMES AS NEEDED     irbesartan (AVAPRO) 300 MG tablet Take one tablet by mouth daily. STOP LOSARTAN 90 tablet 3   loratadine (CLARITIN) 10 MG tablet Take 10 mg by mouth at bedtime.      Melatonin 5 MG TABS Take by mouth.      metFORMIN (GLUCOPHAGE) 500 MG tablet Take 500 mg by mouth daily with breakfast.     metoCLOPramide (REGLAN) 10 MG tablet TAKE 1/2 TABLET BY MOUTH 4 TIMES A DAY 60 tablet 3   Multiple Vitamin (MULTIVITAMIN WITH MINERALS) TABS tablet Take 1 tablet by mouth daily.     naproxen sodium (ALEVE) 220 MG tablet Take 220 mg by mouth.     OMEPRAZOLE MAGNESIUM PO Take 40 mg by mouth daily with breakfast.      ondansetron (ZOFRAN ODT) 4 MG disintegrating tablet Take 1 tablet (4 mg total) by mouth every 8 (eight)  hours as needed. 10 tablet 0   Probiotic Product (ALIGN) 4 MG CAPS Take 4 mg by mouth daily.      psyllium (METAMUCIL SMOOTH TEXTURE) 28 % packet Take 1 packet by mouth daily before breakfast.     sodium chloride 1 g tablet Take 6 g by mouth daily.     venlafaxine XR (EFFEXOR-XR) 75 MG 24 hr capsule TAKE 1 CAPSULE (75 MG TOTAL) BY MOUTH DAILY WITH BREAKFAST. 90 capsule 2   No facility-administered medications prior to visit.     Review of Systems:   Constitutional:   No  weight loss, night sweats,  Fevers, chills,  +fatigue, or  lassitude.  HEENT:   No headaches,  Difficulty swallowing,  Tooth/dental problems, or  Sore throat,                No sneezing, itching, ear ache, nasal congestion, post nasal drip,   CV:  No chest pain,  Orthopnea, PND, swelling in lower extremities, anasarca, dizziness, palpitations, syncope.   GI  No heartburn, indigestion, abdominal pain, nausea, vomiting, diarrhea, change in bowel habits, loss of appetite, bloody stools.   Resp: No shortness of breath with exertion or at rest.  No excess mucus, no productive cough,  No non-productive cough,  No coughing up of blood.  No change in color of mucus.  No wheezing.  No chest wall deformity  Skin: no rash or lesions.  GU: no dysuria, change in color of urine, no urgency or frequency.  No flank pain, no hematuria   MS:  No joint pain or swelling.  No decreased range of motion.  No back pain.    Physical Exam  BP 116/70 (BP Location: Left Arm, Patient Position: Sitting, Cuff Size: Normal)   Pulse 64   Temp 98.4 F (36.9 C) (Oral)   Ht 5' 6"  (1.676 m)   Wt 211 lb 6.4 oz (95.9 kg)   LMP  (LMP Unknown)   SpO2 100%   BMI 34.12 kg/m   GEN: A/Ox3; pleasant , NAD, well nourished    HEENT:  [place]/AT,  NOSE-clear, THROAT-clear, no lesions, no postnasal drip or exudate noted.   NECK:  Supple w/ fair ROM; no JVD; normal carotid impulses w/o bruits; no thyromegaly or nodules palpated; no lymphadenopathy.     RESP  Clear  P & A; w/o, wheezes/ rales/ or rhonchi. no accessory muscle use, no dullness to percussion  CARD:  RRR, no m/r/g, no peripheral edema, pulses intact, no cyanosis or clubbing.  GI:   Soft & nt; nml bowel sounds; no organomegaly or masses detected.   Musco: Warm bil, no deformities or joint swelling noted.   Neuro: alert, no focal deficits noted.    Skin: Warm, no lesions or rashes    Lab Results:  CBC    BNP No results found for: BNP   Imaging: No results found.    PFT Results Latest Ref Rng & Units 08/21/2016  FVC-Pre L 3.07  FVC-Predicted Pre % 94  FVC-Post L 3.00  FVC-Predicted Post % 91  Pre FEV1/FVC % % 84  Post FEV1/FCV % % 85  FEV1-Pre L 2.59  FEV1-Predicted Pre % 104  FEV1-Post L 2.55  DLCO uncorrected ml/min/mmHg 18.57  DLCO UNC% % 72  DLCO corrected ml/min/mmHg 19.46  DLCO COR %Predicted % 76  DLVA Predicted % 93  TLC L 5.36  TLC % Predicted % 103  RV % Predicted % 115    No results found for: NITRICOXIDE      Assessment & Plan:   OSA (obstructive sleep apnea) Moderate obstructive sleep apnea-patient has excellent control and compliance on CPAP. Unfortunately she does not like her CPAP and would love an alternative for it.  We did discuss the inspire device.  Patient education was given.  She like a referral to ENT for further discussion to see if she qualifies for the inspire device  Plan  Patient Instructions  Refer to ENT for INSPIRE Device evaluation . (Dr. Wilburn Cornelia)  Continue on CPAP At bedtime.  Keep up good work.  Do not drive if sleepy .  Work on healthy weight  Follow up with Dr. Halford Chessman  Or Tadao Emig in 1 year and As needed        Class 1 obesity with serious comorbidity and body mass index (BMI) of 32.0 to 32.9 in adult Continue with healthy weight loss    I spent  30  minutes dedicated to the care of this patient on the date of this encounter to include pre-visit review of records, face-to-face time with  the patient discussing conditions above, post visit ordering of testing, clinical documentation with the electronic health record, making appropriate referrals as documented, and communicating necessary findings to members of the patients care team.   Rexene Edison, NP 01/26/2021

## 2021-01-26 NOTE — Patient Instructions (Addendum)
Refer to ENT for INSPIRE Device evaluation . (Dr. Wilburn Cornelia)  Continue on CPAP At bedtime.  Keep up good work.  Do not drive if sleepy .  Work on healthy weight  Follow up with Dr. Halford Chessman  Or Makaylynn Bonillas in 1 year and As needed

## 2021-01-26 NOTE — Assessment & Plan Note (Signed)
Moderate obstructive sleep apnea-patient has excellent control and compliance on CPAP. Unfortunately she does not like her CPAP and would love an alternative for it.  We did discuss the inspire device.  Patient education was given.  She like a referral to ENT for further discussion to see if she qualifies for the inspire device  Plan  Patient Instructions  Refer to ENT for INSPIRE Device evaluation . (Dr. Wilburn Cornelia)  Continue on CPAP At bedtime.  Keep up good work.  Do not drive if sleepy .  Work on healthy weight  Follow up with Dr. Halford Chessman  Or Domnique Vanegas in 1 year and As needed

## 2021-01-26 NOTE — Assessment & Plan Note (Signed)
Continue with healthy weight loss 

## 2021-01-29 NOTE — Progress Notes (Signed)
Chief Complaint:   OBESITY [name] is here to discuss her progress with her obesity treatment plan along with follow-up of her obesity related diagnoses. [name] is on keeping a food journal and adhering to recommended goals of 1300-1400 calories and 100 grams of protein daily and states she is following her eating plan approximately 90% of the time. [name] states she is at the gym for 60 minutes 3 times per week.  Today's visit was #: 23 Starting weight: 220 lbs Starting date: 01/19/2018 Today's weight: 204 lbs Today's date: 01/25/2021 Total lbs lost to date: 16 Total lbs lost since last in-office visit: 0  Interim History: [name] recently started back at the gym this past week and she is happy with that. This is her first office visit with me. She has no issues with the meal plan and she states she could follow it better. She is here for fasting blood work today, but she has an appointment with her primary care physician in the near future for fasting blood work; and they will check her cholesterol, CMP, CBC, etc per the patient. She will bring those  results with her to her next office visit.   Subjective:   1. Hypertension associated with diabetes [facility]) [name] recently modified her blood pressure medications, and it was done at the hypertension clinic by the pharmacy. Her blood pressure has been under great control. Her blood pressure at home ranges between 130's/70's or 120's/60's in general.   2. Type 2 diabetes mellitus with other specified complication, without long-term current use of insulin (HCC) [name]'s last A1c was 5.7 in 03/2020. She is seeing Dr. Yong Channel next week for fasting blood work, also sees Dr. Loanne Drilling of Endocrinology for management of her diabetes mellitus.  3. Vitamin D deficiency [name] is currently taking multivitamins plus Vit D 5,000 IU q daily.   Assessment/Plan:   Orders Placed This Encounter  Procedures   VITAMIN D 25  Hydroxy (Vit-D Deficiency, Fractures)   Insulin, random   Hemoglobin A1c   Magnesium   Vitamin B12    1. Hypertension associated with diabetes [facility]) Caneisha will continue with Cardiology for management of hypertension. She is to decrease salt, increase exercise, and prudent nutritional plan with weight loss. We will check labs today, and will watch for signs of hypotension as she continues her lifestyle modifications.  - Magnesium - Vitamin B12  2. Type 2 diabetes mellitus with other specified complication, without long-term current use of insulin (HCC) We will check labs today. Laverne is on metformin, so we will check B12 and magnesium since she is at risk of deficiency. Good blood sugar control is important to decrease the likelihood of diabetic complications such as nephropathy, neuropathy, limb loss, blindness, coronary artery disease, and death. Intensive lifestyle modification including diet, exercise and weight loss are the first line of treatment for diabetes.   - Insulin, random - Hemoglobin A1c - Magnesium - Vitamin B12  3. Vitamin D deficiency Low Vitamin D level contributes to fatigue and are associated with obesity, breast, and colon cancer. We will check labs today. [name] will follow-up for routine testing of Vitamin D, at least 2-3 times per year to avoid over-replacement.  - VITAMIN D 25 Hydroxy (Vit-D Deficiency, Fractures)  4. Obesity with current BMI of 33.0 [name] is currently in the action stage of change. As such, her goal is to continue with weight loss efforts. She has agreed to keeping a food journal and adhering to recommended goals of  1300-1400 calories and 100 grams of protein daily.   Exercise goals: As is.  Behavioral modification strategies: keeping healthy foods in the home, ways to avoid boredom eating, and avoiding temptations.  [name] has agreed to follow-up with our clinic in 2 to 4 weeks. She was informed of the importance of  frequent follow-up visits to maximize her success with intensive lifestyle modifications for her multiple health conditions.   [name] was informed we would discuss her lab results at her next visit unless there is a critical issue that needs to be addressed sooner. [name] agreed to keep her next visit at the agreed upon time to discuss these results.  Objective:   Blood pressure (!) 148/64, pulse 73, temperature 98.8 F (37.1 C), height '5\' 6"'$  (1.676 m), weight 204 lb (92.5 kg), SpO2 100 %. Body mass index is 32.93 kg/m.  General: Cooperative, alert, well developed, in no acute distress. HEENT: Conjunctivae and lids unremarkable. Cardiovascular: Regular rhythm.  Lungs: Normal work of breathing. Neurologic: No focal deficits.   Lab Results  Component Value Date   CREATININE 0.69 10/24/2020   BUN 19 10/24/2020   NA 132 (L) 10/24/2020   K 4.7 10/24/2020   CL 95 (L) 10/24/2020   CO2 23 10/24/2020   Lab Results  Component Value Date   ALT 19 08/15/2020   AST 33 08/15/2020   ALKPHOS 149 (H) 08/15/2020   BILITOT 0.5 08/15/2020   Lab Results  Component Value Date   HGBA1C 6.3 (H) 01/25/2021   HGBA1C 5.7 (H) 04/20/2020   HGBA1C 5.9 (A) 12/09/2019   HGBA1C 6.1 06/08/2019   HGBA1C 5.7 (A) 05/24/2019   Lab Results  Component Value Date   INSULIN 7.7 01/25/2021   INSULIN 13.1 04/20/2020   INSULIN 12.1 01/19/2018   Lab Results  Component Value Date   TSH 3.38 06/04/2018   Lab Results  Component Value Date   CHOL 141 04/20/2020   HDL 65 04/20/2020   LDLCALC 63 04/20/2020   LDLDIRECT 58.0 11/25/2017   TRIG 66 04/20/2020   CHOLHDL 2.2 04/20/2020   Lab Results  Component Value Date   VD25OH 74.5 01/25/2021   VD25OH 66.5 04/20/2020   VD25OH 52.21 06/08/2019   Lab Results  Component Value Date   WBC 10.9 (H) 06/09/2020   HGB 12.9 06/09/2020   HCT 39.0 06/09/2020   MCV 87.6 06/09/2020   PLT 338 06/09/2020   No results found for: IRON, TIBC,  FERRITIN  Obesity Behavioral Intervention:   Approximately 15 minutes were spent on the discussion below.  ASK: We discussed the diagnosis of obesity with [name] today and [name] agreed to give [name] permission to discuss obesity behavioral modification therapy today.  ASSESS: Anah has the diagnosis of obesity and her BMI today is 32.94. Mykell is in the action stage of change.   ADVISE: Shontrice was educated on the multiple health risks of obesity as well as the benefit of weight loss to improve her health. She was advised of the need for long term treatment and the importance of lifestyle modifications to improve her current health and to decrease her risk of future health problems.  AGREE: Multiple dietary modification options and treatment options were discussed and Mistina agreed to follow the recommendations documented in the above note.  ARRANGE: Mahima was educated on the importance of frequent visits to treat obesity as outlined per CMS and USPSTF guidelines and agreed to schedule her next follow up appointment today.  Attestation Statements:   Reviewed by  clinician on day of visit: allergies, medications, problem list, medical history, surgical history, family history, social history, and previous encounter notes.   Wilhemena Durie, am acting as transcriptionist for [employer], DO.  I have reviewed the above documentation for accuracy and completeness, and I agree with the above. Marjory Sneddon, D.O.  The [place] was signed into law in 2016 which includes the topic of electronic health records.  This provides immediate access to information in MyChart.  This includes consultation notes, operative notes, office notes, lab results and pathology reports.  If you have any questions about what you read please let us know at your next visit so we can discuss your concerns and take corrective action if need be.  We are right here  with you.

## 2021-01-29 NOTE — Progress Notes (Signed)
Reviewed and agree with assessment/plan.   Chesley Mires, MD [facility] Pulmonary/Critical Care 01/29/2021, 8:50 AM Pager:  [phone]

## 2021-01-30 ENCOUNTER — Other Ambulatory Visit: Payer: Self-pay | Admitting: Family Medicine

## 2021-01-31 ENCOUNTER — Encounter (INDEPENDENT_AMBULATORY_CARE_PROVIDER_SITE_OTHER): Payer: Self-pay

## 2021-02-01 ENCOUNTER — Encounter (HOSPITAL_BASED_OUTPATIENT_CLINIC_OR_DEPARTMENT_OTHER): Payer: Self-pay

## 2021-02-01 ENCOUNTER — Encounter: Payer: Self-pay | Admitting: Family Medicine

## 2021-02-01 ENCOUNTER — Other Ambulatory Visit: Payer: Self-pay

## 2021-02-01 ENCOUNTER — Emergency Department (HOSPITAL_BASED_OUTPATIENT_CLINIC_OR_DEPARTMENT_OTHER)
Admission: EM | Admit: 2021-02-01 | Discharge: 2021-02-01 | Disposition: A | Discharge status: Home / Self Care | Payer: Medicare PPO | Attending: Emergency Medicine | Admitting: Emergency Medicine

## 2021-02-01 ENCOUNTER — Ambulatory Visit (INDEPENDENT_AMBULATORY_CARE_PROVIDER_SITE_OTHER): Payer: Medicare PPO | Admitting: Family Medicine

## 2021-02-01 ENCOUNTER — Emergency Department (HOSPITAL_BASED_OUTPATIENT_CLINIC_OR_DEPARTMENT_OTHER): Payer: Medicare PPO

## 2021-02-01 VITALS — BP 110/64 | HR 61 | Temp 98.1°F

## 2021-02-01 DIAGNOSIS — S42295A Other nondisplaced fracture of upper end of left humerus, initial encounter for closed fracture: Secondary | ICD-10-CM | POA: Insufficient documentation

## 2021-02-01 DIAGNOSIS — Z79899 Other long term (current) drug therapy: Secondary | ICD-10-CM | POA: Diagnosis not present

## 2021-02-01 DIAGNOSIS — R42 Dizziness and giddiness: Secondary | ICD-10-CM | POA: Diagnosis not present

## 2021-02-01 DIAGNOSIS — I152 Hypertension secondary to endocrine disorders: Secondary | ICD-10-CM

## 2021-02-01 DIAGNOSIS — I5030 Unspecified diastolic (congestive) heart failure: Secondary | ICD-10-CM | POA: Diagnosis not present

## 2021-02-01 DIAGNOSIS — E119 Type 2 diabetes mellitus without complications: Secondary | ICD-10-CM | POA: Insufficient documentation

## 2021-02-01 DIAGNOSIS — W19XXXA Unspecified fall, initial encounter: Secondary | ICD-10-CM | POA: Diagnosis not present

## 2021-02-01 DIAGNOSIS — S4991XA Unspecified injury of right shoulder and upper arm, initial encounter: Secondary | ICD-10-CM | POA: Diagnosis present

## 2021-02-01 DIAGNOSIS — M25412 Effusion, left shoulder: Secondary | ICD-10-CM | POA: Diagnosis not present

## 2021-02-01 DIAGNOSIS — Z7902 Long term (current) use of antithrombotics/antiplatelets: Secondary | ICD-10-CM | POA: Insufficient documentation

## 2021-02-01 DIAGNOSIS — E1159 Type 2 diabetes mellitus with other circulatory complications: Secondary | ICD-10-CM

## 2021-02-01 DIAGNOSIS — M25519 Pain in unspecified shoulder: Secondary | ICD-10-CM | POA: Diagnosis not present

## 2021-02-01 DIAGNOSIS — J45909 Unspecified asthma, uncomplicated: Secondary | ICD-10-CM | POA: Insufficient documentation

## 2021-02-01 DIAGNOSIS — I10 Essential (primary) hypertension: Secondary | ICD-10-CM | POA: Diagnosis not present

## 2021-02-01 DIAGNOSIS — Z7952 Long term (current) use of systemic steroids: Secondary | ICD-10-CM | POA: Insufficient documentation

## 2021-02-01 DIAGNOSIS — Z96651 Presence of right artificial knee joint: Secondary | ICD-10-CM | POA: Insufficient documentation

## 2021-02-01 DIAGNOSIS — Z8673 Personal history of transient ischemic attack (TIA), and cerebral infarction without residual deficits: Secondary | ICD-10-CM

## 2021-02-01 DIAGNOSIS — S42212A Unspecified displaced fracture of surgical neck of left humerus, initial encounter for closed fracture: Secondary | ICD-10-CM | POA: Diagnosis not present

## 2021-02-01 DIAGNOSIS — S42292A Other displaced fracture of upper end of left humerus, initial encounter for closed fracture: Secondary | ICD-10-CM | POA: Diagnosis not present

## 2021-02-01 DIAGNOSIS — S0003XA Contusion of scalp, initial encounter: Secondary | ICD-10-CM | POA: Diagnosis not present

## 2021-02-01 LAB — CBG MONITORING, ED
Glucose-Capillary: 128 mg/dL — ABNORMAL HIGH (ref 70–99)
Glucose-Capillary: 165 mg/dL — ABNORMAL HIGH (ref 70–99)

## 2021-02-01 MED ORDER — SENNOSIDES-DOCUSATE SODIUM 8.6-50 MG PO TABS: 1.0000 | ORAL_TABLET | Freq: Every day | ORAL | 0 refills | Status: AC | PRN

## 2021-02-01 MED ORDER — IBUPROFEN 600 MG PO TABS: 600.0000 mg | ORAL_TABLET | Freq: Four times a day (QID) | ORAL | 0 refills | Status: DC | PRN

## 2021-02-01 MED ORDER — OXYCODONE HCL 5 MG PO TABS: 5.0000 mg | ORAL_TABLET | Freq: Four times a day (QID) | ORAL | 0 refills | Status: AC | PRN

## 2021-02-01 MED ORDER — OXYCODONE-ACETAMINOPHEN 5-325 MG PO TABS
1.0000 | ORAL_TABLET | Freq: Once | ORAL | Status: AC
  Administered 2021-02-01: 1 via ORAL
  Filled 2021-02-01: qty 1

## 2021-02-01 MED ORDER — ACETAMINOPHEN 325 MG PO TABS: 650.0000 mg | ORAL_TABLET | Freq: Four times a day (QID) | ORAL | 0 refills | Status: DC | PRN

## 2021-02-01 NOTE — ED Provider Notes (Signed)
[facility] EMERGENCY DEPARTMENT Provider Note   CSN: [id number] Arrival date & time: 02/01/21  1042     History Chief Complaint  Patient presents with   Fall    [name] is a 70 y.o. female on Plavix presenting emergency department with a fall and injury to her left shoulder.  The patient was going to her doctor's office today for routine blood work, and reports that she tripped on a step going to the office.  She reports he landed on her left shoulder.  She had immediate pain in her left shoulder.  She denies striking her head.  She denies headache.  She denies loss of consciousness.  She denies history of syncope or near syncope.  She follows [facility].  Hx of rotator cuff shoulder surgery left side.  She denies hip pain, injuries to the other extremities.  She denies numbness in her hand.  She is here with her husband at bedside.  HPI     Past Medical History:  Diagnosis Date   Allergy    Anxiety    Arthritis    back & knee   Asthma     mild per pt shows up with resp illness   Colon polyps    Constipation    Diabetes mellitus without complication (HCC)    Dyspnea    Gallstones    Gastric polyps    Gastroparesis    GERD (gastroesophageal reflux disease)    Headache    sinus headaches and migraines at times   Heart murmur    History of migraine headaches    HTN (hypertension)    Hyperlipidemia    IBS (irritable bowel syndrome)    Joint pain    Lumbar disc disease    S/P aortic valve replacement with bioprosthetic valve 08/23/2016   25 mm Edwards Intuity rapid-deployment bovine pericardial tissue valve via partial upper mini sternotomy   Sleep apnea    CPAP   TIA (transient ischemic attack)     Patient Active Problem List   Diagnosis Date Noted   Idiopathic hyperphosphatasia 08/03/2019   Vitamin D deficiency 06/04/2018   Hyponatremia 02/17/2018   History of CVA in adulthood 08/13/2017   Blurry vision 08/13/2017    Hypertension associated with diabetes ([place]) 01/02/2017   S/P minimally invasive aortic valve replacement with bioprosthetic valve 16/60/6301   Diastolic dysfunction    Osteopenia 05/27/2016   OSA (obstructive sleep apnea) 02/27/2016   Class 1 obesity with serious comorbidity and body mass index (BMI) of 32.0 to 32.9 in adult 10/23/2015   Bruxism 10/23/2015   Elevated alkaline phosphatase level 05/16/2015   Atypical chest pain 11/10/2014   Acne 05/12/2014   Diabetes mellitus ([place]) 05/12/2014   GERD (gastroesophageal reflux disease) 03/10/2014   Generalized anxiety disorder 03/10/2014   Migraines 03/10/2014   EUSTACHIAN TUBE DYSFUNCTION, CHRONIC 06/14/2010   Irritable bowel syndrome 12/15/2008   Low back pain 08/11/2008   Hyperlipidemia associated with type 2 diabetes mellitus ([place]) 02/09/2008   Allergic rhinitis 02/12/2007   Asthma 02/12/2007    Past Surgical History:  Procedure Laterality Date   ABDOMINAL HYSTERECTOMY     AORTIC VALVE REPLACEMENT N/A 08/23/2016   Procedure: AORTIC VALVE REPLACEMENT (AVR) - using partial Upper Sternotomy- 26m Edwards Intuity Aortic Valve used;  Surgeon: CRexene Alberts MD;  [place] [facility]  Service: Open Heart Surgery;  Laterality: N/A;   BLADDER SUSPENSION  2007   BREAST ENHANCEMENT SURGERY  1980   CARPAL TUNNEL  RELEASE Bilateral    COLONOSCOPY     DORSAL COMPARTMENT RELEASE Right 09/15/2014   Procedure: RIGHT WRIST DEQUERVAINS RELEASE ;  Surgeon: Kathryne Hitch, MD;  Location: [facility];  Service: Orthopedics;  Laterality: Right;   ESOPHAGOGASTRODUODENOSCOPY     EYE SURGERY     cataract surgery   KNEE ARTHROSCOPY  04/15/2012   Procedure: ARTHROSCOPY KNEE;  Surgeon: [name], MD;  Location: [facility];  Service: Orthopedics;  Laterality: Right;   LAPAROSCOPIC CHOLECYSTECTOMY     LASIK     OOPHORECTOMY     PALATE TO GINGIVA GRAFT  2017   RIGHT/LEFT HEART CATH AND CORONARY ANGIOGRAPHY N/A 08/02/2016   Procedure: Right/Left  Heart Cath and Coronary Angiography;  Surgeon: Sherren Mocha, MD;  Location: [facility];  Service: Cardiovascular;  Laterality: N/A;   ROTATOR CUFF REPAIR Left    TEE WITHOUT CARDIOVERSION N/A 08/23/2016   Procedure: TRANSESOPHAGEAL ECHOCARDIOGRAM (TEE);  Surgeon: Rexene Alberts, MD;  Location: [facility];  Service: Open Heart Surgery;  Laterality: N/A;   TOTAL KNEE ARTHROPLASTY  04/15/2012   Procedure: TOTAL KNEE ARTHROPLASTY;  Surgeon: [name], MD;  Location: [facility];  Service: Orthopedics;  Laterality: Right;   TRIGGER FINGER RELEASE Right 04/20/2015   Procedure: RIGHT TRIGGER FINGER RELEASE (TENDON SHEALTH INCISION) ;  Surgeon: [name], MD;  Location: [facility];  Service: Orthopedics;  Laterality: Right;     OB History     Gravida  0   Para  0   Term  0   Preterm  0   AB  0   Living  0      SAB  0   IAB  0   Ectopic  0   Multiple  0   Live Births  0           Family History  Problem Relation Age of Onset   COPD Mother    Colon polyps Mother    Irritable bowel syndrome Mother    Anxiety disorder Mother    Heart disease Father    Alcohol abuse Father    Breast cancer Maternal Grandmother    Gallbladder disease Maternal Grandmother    Colon polyps Maternal Aunt    Diabetes Paternal Uncle        several uncles   Other Neg Hx        hyponatremia   Colon cancer Neg Hx    Esophageal cancer Neg Hx    Rectal cancer Neg Hx    Stomach cancer Neg Hx     Social History   Tobacco Use   Smoking status: Never   Smokeless tobacco: Never  Vaping Use   Vaping Use: Never used  Substance Use Topics   Alcohol use: No   Drug use: No    Home Medications Prior to Admission medications   Medication Sig Start Date End Date Taking? Authorizing Provider  acetaminophen (TYLENOL) 325 MG tablet Take 2 tablets (650 mg total) by mouth every 6 (six) hours as needed for up to 30 doses. 02/01/21  Yes Lira Stephen, Carola Rhine, MD  ibuprofen (ADVIL)  600 MG tablet Take 1 tablet (600 mg total) by mouth every 6 (six) hours as needed for up to 30 doses. 02/01/21  Yes Bernece Gall, Carola Rhine, MD  oxyCODONE (ROXICODONE) 5 MG immediate release tablet Take 1 tablet (5 mg total) by mouth every 6 (six) hours as needed for up to 20 days for severe pain.  02/01/21 02/21/21 Yes Jaykwon Morones, Carola Rhine, MD  senna-docusate (SENOKOT-S) 8.6-50 MG tablet Take 1 tablet by mouth daily as needed for mild constipation. 02/01/21 03/03/21 Yes Dallie Patton, Carola Rhine, MD  ACCU-CHEK GUIDE test strip USE TO TEST BLOOD SUGARS DAILY. DX: E11.9 10/04/20   [name], MD  Accu-Chek Softclix Lancets lancets USE AS INSTRUCTED 10/04/20   [name], MD  amLODipine (NORVASC) 10 MG tablet Take 10 mg by mouth daily. 10/02/20   Freada Bergeron, MD  atorvastatin (LIPITOR) 40 MG tablet TAKE 1 TABLET (40 MG TOTAL) BY MOUTH DAILY AT 6 PM. 08/07/20   [name], MD  Biotin 5000 MCG TABS Take 5,000 mcg by mouth daily.     [provider]  blood glucose meter kit and supplies KIT Dispense based on patient and insurance preference. Use up to four times daily as directed. Dx: E11.9 12/10/19   [name], MD  calcium-vitamin D (OSCAL WITH D) 500-200 MG-UNIT per tablet Take 2 tablets by mouth daily.     [provider]  Cholecalciferol (VITAMIN D) 125 MCG (5000 UT) CAPS Take 5,000 Units by mouth daily.    [provider]  cloNIDine (CATAPRES) 0.1 MG tablet Take 1 tablet (0.1 mg total) by mouth at bedtime. 05/23/20   Dorothy Spark, MD  clopidogrel (PLAVIX) 75 MG tablet TAKE 1 TABLET BY MOUTH EVERY DAY 08/07/20   [name], MD  demeclocycline (DECLOMYCIN) 300 MG tablet TAKE 1 TABLET (300 MG TOTAL) BY MOUTH 2 (TWO) TIMES DAILY. 09/06/20   Renato Shin, MD  docusate sodium (COLACE) 100 MG capsule Take 1 capsule (100 mg total) by mouth 2 (two) times daily. 11/09/20   [name], PA-C  fluticasone (FLONASE) 50 MCG/ACT nasal spray Place 2 sprays into both  nostrils daily.  04/24/14   [provider]  ipratropium (ATROVENT) 0.03 % nasal spray 2 SPRAYS IN EACH NOSTRIL AS NEEDED THREE TIMES AS NEEDED 03/29/19   [provider]  irbesartan (AVAPRO) 300 MG tablet Take one tablet by mouth daily. STOP LOSARTAN 11/09/20   Freada Bergeron, MD  loratadine (CLARITIN) 10 MG tablet Take 10 mg by mouth at bedtime.     [provider]  Melatonin 5 MG TABS Take by mouth.     [provider]  metFORMIN (GLUCOPHAGE) 500 MG tablet Take 500 mg by mouth daily with breakfast.    [provider]  metoCLOPramide (REGLAN) 10 MG tablet TAKE 1/2 TABLET BY MOUTH 4 TIMES A DAY 01/30/21   [name], MD  Multiple Vitamin (MULTIVITAMIN WITH MINERALS) TABS tablet Take 1 tablet by mouth daily.    [provider]  naproxen sodium (ALEVE) 220 MG tablet Take 220 mg by mouth.    [provider]  OMEPRAZOLE MAGNESIUM PO Take 40 mg by mouth daily with breakfast.     [provider]  ondansetron (ZOFRAN ODT) 4 MG disintegrating tablet Take 1 tablet (4 mg total) by mouth every 8 (eight) hours as needed. 11/18/19   Isla Pence, MD  Probiotic Product (ALIGN) 4 MG CAPS Take 4 mg by mouth daily.     [provider]  psyllium (METAMUCIL SMOOTH TEXTURE) 28 % packet Take 1 packet by mouth daily before breakfast.    [provider]  sodium chloride 1 g tablet Take 6 g by mouth daily.    [provider]  venlafaxine XR (EFFEXOR-XR) 75 MG 24 hr capsule TAKE 1 CAPSULE (75 MG TOTAL)  BY MOUTH DAILY WITH BREAKFAST. 05/23/20   [name], MD    Allergies    Augmentin [amoxicillin-pot clavulanate], Erythromycin, and Penicillins  Review of Systems   Review of Systems  Constitutional:  Negative for chills and fever.  Respiratory:  Negative for cough and shortness of breath.   Cardiovascular:  Negative for chest pain and palpitations.  Gastrointestinal:  Negative for abdominal pain and  vomiting.  Genitourinary:  Negative for dysuria and hematuria.  Musculoskeletal:  Positive for arthralgias and myalgias.  Skin:  Negative for color change and rash.  Neurological:  Negative for syncope, weakness and numbness.  All other systems reviewed and are negative.  Physical Exam Updated Vital Signs BP (!) 166/55   Pulse 78   Temp 98.6 F (37 C) (Oral)   Resp 16   Ht _0  (1.676 m)   Wt 92.5 kg   LMP  (LMP Unknown)   SpO2 94%   BMI 32.93 kg/m   Physical Exam Constitutional:      General: She is not in acute distress. HENT:     Head: Normocephalic and atraumatic.  Eyes:     Conjunctiva/sclera: Conjunctivae normal.     Pupils: Pupils are equal, round, and reactive to light.  Cardiovascular:     Rate and Rhythm: Normal rate and regular rhythm.     Pulses: Normal pulses.  Pulmonary:     Effort: Pulmonary effort is normal. No respiratory distress.  Abdominal:     General: There is no distension.     Tenderness: There is no abdominal tenderness.  Musculoskeletal:     Comments: Left humerus tenderness  Skin:    General: Skin is warm and dry.  Neurological:     General: No focal deficit present.     Mental Status: She is alert and oriented to person, place, and time. Mental status is at baseline.     Sensory: No sensory deficit.     Motor: No weakness.  Psychiatric:        Mood and Affect: Mood normal.        Behavior: Behavior normal.    ED Results / Procedures / Treatments   Labs (all labs ordered are listed, but only abnormal results are displayed) Labs Reviewed  CBG MONITORING, ED - Abnormal; Notable for the following components:      Result Value   Glucose-Capillary 128 (*)    All other components within normal limits  CBG MONITORING, ED - Abnormal; Notable for the following components:   Glucose-Capillary 165 (*)    All other components within normal limits    EKG None  Radiology CT Shoulder Left Wo Contrast  Result Date: 02/01/2021 CLINICAL  DATA:  Shoulder trauma, instability or dislocation suspected, xray done EXAM: CT OF THE UPPER LEFT EXTREMITY WITHOUT CONTRAST TECHNIQUE: Multidetector CT imaging of the upper left extremity was performed according to the standard protocol. COMPARISON:  Same day radiograph FINDINGS: Bones/Joint/Cartilage There is an impacted proximal humerus fracture of the surgical neck with involvement of the greater tuberosity. There is anterior displacement and angulation. No articular surface splitting. Small joint effusion. There is mild anterior inferior subluxation. Ligaments Suboptimally assessed by CT. Muscles and Tendons No muscle atrophy. Soft tissues Adjacent mild edema. IMPRESSION: Impacted and angulated proximal humerus fracture of the surgical neck, extending through the greater tuberosity. Small joint effusion. No articular surface splitting. Mild anterior inferior glenohumeral subluxation. Electronically Signed   By: Maurine Simmering M.D.   On: 02/01/2021 14:02  DG Humerus Left  Result Date: 02/01/2021 CLINICAL DATA:  Fall EXAM: LEFT HUMERUS - 2+ VIEW COMPARISON:  None. FINDINGS: Fracture proximal left humerus. Impacted and comminuted fracture. Possible subluxation of the humeral head. No other fracture of the humerus. IMPRESSION: Impacted fracture proximal left humerus. Electronically Signed   By: Franchot Gallo M.D.   On: 02/01/2021 13:02    Procedures Procedures   Medications Ordered in ED Medications  oxyCODONE-acetaminophen (PERCOCET/ROXICET) 5-325 MG per tablet 1 tablet (1 tablet Oral Given 02/01/21 1123)    ED Course  I have reviewed the triage vital signs and the nursing notes.  Pertinent labs & imaging results that were available during my care of the patient were reviewed by me and considered in my medical decision making (see chart for details).  This patient is here with a mechanical fall onto her left shoulder today.  Her x-rays show an impacted proximal left humeral fracture.  She is  neurovascularly intact on exam.  We have placed her in a sling.  I spoke to Dr Percell Miller who recommended a CT of the shoulder for preoperative planning while the patient is here, in addition I will obtain a CT scan of the head given the extent of her impact on the left shoulder, to evaluate for brain bleed.  Otherwise for trauma evaluation is normal.  I doubt hip fracture or other significant injury.  She was given p.o. pain medication.    CT reviewed, no acute ICH.  Patient comfortable in sling, will discharge home with husband and prescriptions.     Final Clinical Impression(s) / ED Diagnoses Final diagnoses:  Other closed nondisplaced fracture of proximal end of left humerus, initial encounter    Rx / DC Orders ED Discharge Orders          Ordered    acetaminophen (TYLENOL) 325 MG tablet  Every 6 hours PRN        02/01/21 1435    ibuprofen (ADVIL) 600 MG tablet  Every 6 hours PRN        02/01/21 1435    oxyCODONE (ROXICODONE) 5 MG immediate release tablet  Every 6 hours PRN        02/01/21 1435    senna-docusate (SENOKOT-S) 8.6-50 MG tablet  Daily PRN        02/01/21 1435             Wyvonnia Dusky, MD 02/01/21 1636

## 2021-02-01 NOTE — ED Notes (Signed)
Notified XR that pt had been medicated and could go to XR

## 2021-02-01 NOTE — ED Notes (Signed)
Pt given crackers and juice per Langston Masker MD

## 2021-02-01 NOTE — ED Triage Notes (Signed)
Pt arrives via [facility] from PCP where she was going in to have fasting blood work done today pt reports she thinks she tripped over a curb and c/o pain in left shoulder, pt denies hitting head, is on a blood thinner, denies LOC. CBG with [facility]. Pt reports she felt dizzy when she tried to get up from the fall. 160/80 98% RA, HR 62.

## 2021-02-01 NOTE — Discharge Instructions (Addendum)
You will need to wear the arm sling at all times at home.  You will need to follow-up with orthopedic surgeon in the next week in the office.  You can take Tylenol and ibuprofen regularly for pain.  If you have severe pain I prescribed you oxycodone.  I also prescribed Peri-Colace laxative to take the oxycodone to prevent constipation.

## 2021-02-01 NOTE — Progress Notes (Signed)
Phone [phone] In person visit   Subjective:   [name] is a 70 y.o. year old very pleasant female patient who presents for/with See problem oriented charting Chief Complaint  Patient presents with   Fall   This visit occurred during the SARS-CoV-2 public health emergency.  Safety protocols were in place, including screening questions prior to the visit, additional usage of staff PPE, and extensive cleaning of exam room while observing appropriate contact time as indicated for disinfecting solutions.   Past Medical History-  Patient Active Problem List   Diagnosis Date Noted   History of CVA in adulthood 08/13/2017    Priority: High   S/P minimally invasive aortic valve replacement with bioprosthetic valve 08/23/2016    Priority: High   Diastolic dysfunction     Priority: High   Diabetes mellitus ([place]) 05/12/2014    Priority: High   Vitamin D deficiency 06/04/2018    Priority: Medium   Hyponatremia 02/17/2018    Priority: Medium   Hypertension associated with diabetes ([place]) 01/02/2017    Priority: Medium   Osteopenia 05/27/2016    Priority: Medium   OSA (obstructive sleep apnea) 02/27/2016    Priority: Medium   Elevated alkaline phosphatase level 05/16/2015    Priority: Medium   GERD (gastroesophageal reflux disease) 03/10/2014    Priority: Medium   Generalized anxiety disorder 03/10/2014    Priority: Medium   Migraines 03/10/2014    Priority: Medium   Irritable bowel syndrome 12/15/2008    Priority: Medium   Hyperlipidemia associated with type 2 diabetes mellitus ([place]) 02/09/2008    Priority: Medium   Blurry vision 08/13/2017    Priority: Low   Bruxism 10/23/2015    Priority: Low   Atypical chest pain 11/10/2014    Priority: Low   Acne 05/12/2014    Priority: Low   EUSTACHIAN TUBE DYSFUNCTION, CHRONIC 06/14/2010    Priority: Low   Low back pain 08/11/2008    Priority: Low   Allergic rhinitis 02/12/2007    Priority: Low   Asthma 02/12/2007     Priority: Low   Idiopathic hyperphosphatasia 08/03/2019   Class 1 obesity with serious comorbidity and body mass index (BMI) of 32.0 to 32.9 in adult 10/23/2015    Medications- reviewed and updated Current Outpatient Medications  Medication Sig Dispense Refill   ACCU-CHEK GUIDE test strip USE TO TEST BLOOD SUGARS DAILY. DX: E11.9 100 strip 12   Accu-Chek Softclix Lancets lancets USE AS INSTRUCTED 100 each 12   amLODipine (NORVASC) 10 MG tablet Take 10 mg by mouth daily. 90 tablet 1   atorvastatin (LIPITOR) 40 MG tablet TAKE 1 TABLET (40 MG TOTAL) BY MOUTH DAILY AT 6 PM. 90 tablet 3   Biotin 5000 MCG TABS Take 5,000 mcg by mouth daily.      blood glucose meter kit and supplies KIT Dispense based on patient and insurance preference. Use up to four times daily as directed. Dx: E11.9 1 each 0   calcium-vitamin D (OSCAL WITH D) 500-200 MG-UNIT per tablet Take 2 tablets by mouth daily.      Cholecalciferol (VITAMIN D) 125 MCG (5000 UT) CAPS Take 5,000 Units by mouth daily.     cloNIDine (CATAPRES) 0.1 MG tablet Take 1 tablet (0.1 mg total) by mouth at bedtime. 90 tablet 2   clopidogrel (PLAVIX) 75 MG tablet TAKE 1 TABLET BY MOUTH EVERY DAY 90 tablet 3   demeclocycline (DECLOMYCIN) 300 MG tablet TAKE 1 TABLET (300 MG TOTAL) BY MOUTH 2 (TWO)  TIMES DAILY. 180 tablet 3   docusate sodium (COLACE) 100 MG capsule Take 1 capsule (100 mg total) by mouth 2 (two) times daily. 60 capsule 0   fluticasone (FLONASE) 50 MCG/ACT nasal spray Place 2 sprays into both nostrils daily.      ipratropium (ATROVENT) 0.03 % nasal spray 2 SPRAYS IN EACH NOSTRIL AS NEEDED THREE TIMES AS NEEDED     irbesartan (AVAPRO) 300 MG tablet Take one tablet by mouth daily. STOP LOSARTAN 90 tablet 3   loratadine (CLARITIN) 10 MG tablet Take 10 mg by mouth at bedtime.      Melatonin 5 MG TABS Take by mouth.      metFORMIN (GLUCOPHAGE) 500 MG tablet Take 500 mg by mouth daily with breakfast.     metoCLOPramide (REGLAN) 10 MG tablet  TAKE 1/2 TABLET BY MOUTH 4 TIMES A DAY 60 tablet 3   Multiple Vitamin (MULTIVITAMIN WITH MINERALS) TABS tablet Take 1 tablet by mouth daily.     naproxen sodium (ALEVE) 220 MG tablet Take 220 mg by mouth.     OMEPRAZOLE MAGNESIUM PO Take 40 mg by mouth daily with breakfast.      ondansetron (ZOFRAN ODT) 4 MG disintegrating tablet Take 1 tablet (4 mg total) by mouth every 8 (eight) hours as needed. 10 tablet 0   Probiotic Product (ALIGN) 4 MG CAPS Take 4 mg by mouth daily.      psyllium (METAMUCIL SMOOTH TEXTURE) 28 % packet Take 1 packet by mouth daily before breakfast.     sodium chloride 1 g tablet Take 6 g by mouth daily.     venlafaxine XR (EFFEXOR-XR) 75 MG 24 hr capsule TAKE 1 CAPSULE (75 MG TOTAL) BY MOUTH DAILY WITH BREAKFAST. 90 capsule 2   No current facility-administered medications for this visit.     Objective:  BP 110/64   Pulse 61   Temp 98.1 F (36.7 C) (Temporal)   LMP  (LMP Unknown)   SpO2 98%  Gen: NAD, resting comfortably on concrete in parking lot. After VS determined to be stable and history obtained as above- cervical spine exam completed without pain or step offs. Patient lifted to wheelchair and brought inside for further evaluation.  CV: RRR no murmurs rubs or gallops Lungs: CTAB no crackles, wheeze, rhonchi Neuro: baselien twitching in face and hand noted, was able to assist getting into chair  in parking lot though with significant assist    Assessment and Plan    #Falls/lightheadedness S: Patient presented for her regular office visit fasting this morning.  Before she was able to make it into the building she tripped on the curb and fell onto her left side-taking the majority of the force onto the left shoulder-she is not sure if she hit her head.  Denies loss of consciousness.  She did not feel lightheaded before her fall.  Patient attempted to stand up with help of her husband but apparently felt dizzy and fell backwards against a car.  At this point our  staff was alerted and we went to evaluate patient  Patient denied chest pain, shortness of breath, headache.  She was alert and oriented x4.  No neck pain. No pain with palpation over cervical spine.  Vital signs were stable-at this point patient was raised into a wheelchair with significant assist after making sure she stabilized sitting up.   Her chief complaint was left shoulder pain up to 7 out of 10 when being assisted into her chair otherwise about a  5 out of 10.    Blood sugar was checked and was greater than 80 A/P: 70 year old female with history of stroke on long-term Plavix with 2 falls this morning without obvious significant head trauma and no LOC. Her initial fall certainly sounds mechanical-after the fall in the heat and with left shoulder pain she had a recurrent fall associated with dizziness (likely orthostatic)    Blood sugar was checked and not hypoglycemia.  Of note patient has been fasting for visit this morning though she had been drinking water.  We discussed option of having her regular visit with me today and then sending over to [facility] x-[facility] for left shoulder films--she is feeling she did not feel confident in her ability to get into even without significant assist.  EMS had already been called and was in route-after discussion patient wanted to proceed forward with EMS and emergency room evaluation  Patient does have history of stroke with no obvious weakness facial or extremity on exam-did not complete full neuro evaluation before she left with EMS-she will have further evaluation in emergency room. Would defer decision on neuroimaging to EDP.    # Diabetes S: compliant with metformin 556m BID in past- now down to 500 AM only CBGs-142 at visit today Exercise and diet-Per review of chart patient has been doing an excellent job with healthy weight and wellness program Lab Results  Component Value Date   HGBA1C 6.3 (H) 01/25/2021   HGBA1C 5.7 (H) 04/20/2020   HGBA1C  5.9 (A) 12/09/2019  A/P: A1c recently checked and remains reasonably well controlled though trending up-we will continue metformin for now  #hypertension/diastolic dysfunction (prior listed as CHF but no chronic lasix need and no hospitalization for HF) S: now on clonidine 0.137mnightly to see if helps with sweating issues, irbesartan 30043mior on metoprolol 12.5mg56mD and lasix BP Readings from Last 3 Encounters:  02/01/21 110/64  01/26/21 116/70  01/25/21 (!) 148/64  A/P: Blood pressure low normal today-may have had some orthostatic symptoms when she attempted to stand back up in the heat after initial fall-for now continue with current blood pressure medicine-clonidine primarily for sweating issues  Recommended follow up: will needt o reschedule regular evaluation Future Appointments  Date Time Provider [place]23/2022 11:40 AM OpalMellody Dance M[facility]-MWM None  02/14/2021  3:00 PM ElliRenato Shin [facility]ENDO None  03/20/2021  2:45 PM WeavRichardson DoppPA-C [facility]  04/27/2021 11:00 AM [facility] HEALTH COACH [facility] PEC  08/02/2021 10:50 AM JaffPieter Partridge LBN-LBNG None  08/30/2021  3:00 PM MC-CV CH ECHO 4 MC[facility]SITE3ECHO [facility]   Lab/Order associations:   ICD-10-CM   1. Hypertension associated with diabetes (HCC)[place]11.59    I15.2     2. Type 2 diabetes mellitus without complication, unspecified whether long term insulin use (HCC)  E11.9     3. History of CVA in adulthood  Z86.Z59.73  I,Harris Phan,acting as a scriEducation administrator StepGarret Reddish.,have documented all relevant documentation on the behalf of StepGarret Reddish,as directed by  StepGarret Reddish while in the presence of StepGarret Reddish.   I, StepGarret Reddish, have reviewed all documentation for this visit. The documentation on 02/01/21 for the exam, diagnosis, procedures, and orders are all accurate and complete.   Return precautions advised.  StepGarret Reddish

## 2021-02-02 ENCOUNTER — Telehealth: Payer: Self-pay

## 2021-02-02 NOTE — Telephone Encounter (Signed)
Lm for pt tcb. 

## 2021-02-02 NOTE — Telephone Encounter (Signed)
Patient is calling in to update Dr.Hunter that she broke the top of her arm, seeing an orthopedic and has to have surgery.

## 2021-02-02 NOTE — Telephone Encounter (Signed)
Tell patient thank you for the update.  I actually was following along and sent her message last night-tell her I am so sorry about the fracture and need for surgery

## 2021-02-02 NOTE — Telephone Encounter (Signed)
FYI

## 2021-02-05 DIAGNOSIS — S42291A Other displaced fracture of upper end of right humerus, initial encounter for closed fracture: Secondary | ICD-10-CM | POA: Diagnosis not present

## 2021-02-06 ENCOUNTER — Telehealth: Payer: Self-pay | Admitting: *Deleted

## 2021-02-06 NOTE — Telephone Encounter (Signed)
   [facility] Pre-operative Risk Assessment    Patient Name: [name]  DOB: 1950-09-23 MRN: [id number]  HEARTCARE STAFF:  - IMPORTANT!!!!!! Under Visit Info/Reason for Call, type in Other and utilize the format Clearance MM/DD/YY or Clearance TBD. Do not use dashes or single digits. - Please review there is not already an duplicate clearance open for this procedure. - If request is for dental extraction, please clarify the # of teeth to be extracted. - If the patient is currently at the dentist's office, call Pre-Op Callback Staff (MA/nurse) to input urgent request.  - If the patient is not currently in the dentist office, please route to the Pre-Op pool.  Request for surgical clearance:  What type of surgery is being performed? Left total shoulder replacement  When is this surgery scheduled? 02/13/21  What type of clearance is required (medical clearance vs. Pharmacy clearance to hold med vs. Both)? Both  Are there any medications that need to be held prior to surgery and how long? Clopidogrel   Practice name and name of physician performing surgery? [facility], Marchia Bond, MD  What is the office phone number? [phone]   7.   What is the office fax number? [phone] Attn:Sherri  8.   Anesthesia type (None, local, MAC, general) ? General   Michale Emmerich L 02/06/2021, 5:25 PM  _________________________________________________________________   (provider comments below)

## 2021-02-06 NOTE — Progress Notes (Signed)
Sent message, via epic in basket, requesting orders in epic from surgeon.  

## 2021-02-07 ENCOUNTER — Telehealth: Payer: Self-pay

## 2021-02-07 NOTE — Telephone Encounter (Signed)
    Name: [name]  DOB: 1950-10-15  MRN: [id number]   Primary Cardiologist: Freada Bergeron, MD  Chart reviewed as part of pre-operative protocol coverage. Patient was contacted 02/07/2021 in reference to pre-operative risk assessment for pending surgery as outlined below.  Anisten P Mccarver was last seen on 08/30/20 by Dr. Meda Coffee.  Since that day, COURTNEY SHOEN has done well. Just prior to her shoulder injury she was able to complete more than 4.0 METS exercising at the gym. Pt had a SAVR in 2018 and heart cath prior to surgery showed no obstructive CAD.   Therefore, based on ACC/AHA guidelines, the patient would be at acceptable risk for the planned procedure without further cardiovascular testing.   The patient was advised that if she develops new symptoms prior to surgery to contact our office to arrange for a follow-up visit, and she verbalized understanding.  She takes plavix for history of CVA, managed by PCP. Please reach out to Dr. Yong Channel for plavix hold.    I will route this recommendation to the requesting party via Epic fax function and remove from pre-op pool. Please call with questions.  Tami Lin Arend Bahl, PA 02/07/2021, 10:00 AM

## 2021-02-07 NOTE — Telephone Encounter (Signed)
Patient is calling in stating that she has to have surgery next Tuesday and is needing approval to stop Plavix today.

## 2021-02-07 NOTE — Telephone Encounter (Signed)
I filled this out yesterday-should have been faxed today to [facility]

## 2021-02-07 NOTE — Telephone Encounter (Signed)
See below

## 2021-02-07 NOTE — Telephone Encounter (Signed)
I filled this form yesterday and submitted back to [facility] and stated she could hold for 5 days.  I know sometimes this is held for 7 days but I was going to do 5 days also based on timeframe before surgery and not sure when she would get information

## 2021-02-08 ENCOUNTER — Other Ambulatory Visit: Payer: Self-pay | Admitting: Family Medicine

## 2021-02-08 ENCOUNTER — Ambulatory Visit (INDEPENDENT_AMBULATORY_CARE_PROVIDER_SITE_OTHER): Payer: Medicare PPO | Admitting: Physician Assistant

## 2021-02-08 NOTE — Telephone Encounter (Signed)
Called and spoke with pt and pt made aware.

## 2021-02-08 NOTE — Progress Notes (Signed)
DUE TO COVID-19 ONLY ONE VISITOR IS ALLOWED TO COME WITH YOU AND STAY IN THE WAITING ROOM ONLY DURING PRE OP AND PROCEDURE DAY OF SURGERY.  2 VISITOR  MAY VISIT WITH YOU AFTER SURGERY IN YOUR PRIVATE ROOM DURING VISITING HOURS ONLY!  YOU NEED TO HAVE A COVID 19 TEST ON______AME'@_'$  '@_from'$  8am-3pm _____, THIS TEST MUST BE DONE BEFORE SURGERY,  Covid test is done at [place], [place] [address].  This is a drive thru.  No appt required. Please see map.                 Your procedure is scheduled on:  02/13/21  Report to [facility] Main  Entrance   Report to admitting at    [phone]     Call this number if you have problems the morning of surgery [phone]    REMEMBER: NO  SOLID FOOD CANDY OR GUM AFTER MIDNIGHT. CLEAR LIQUIDS UNTIL    0800am         . NOTHING BY MOUTH EXCEPT CLEAR LIQUIDS UNTIL  0800am   . PLEASE FINISH ENSURE DRINK PER SURGEON ORDER  WHICH NEEDS TO BE COMPLETED AT    0800am   .      CLEAR LIQUID DIET   Foods Allowed                                                                    Coffee and tea, regular and decaf                            Fruit ices (not with fruit pulp)                                      Iced Popsicles                                    Carbonated beverages, regular and diet                                    Cranberry, grape and apple juices Sports drinks like Gatorade Lightly seasoned clear broth or consume(fat free) Sugar, honey syrup ___________________________________________________________________      BRUSH YOUR TEETH MORNING OF SURGERY AND RINSE YOUR MOUTH OUT, NO CHEWING GUM CANDY OR MINTS.     Take these medicines the morning of surgery with A SIP OF WATER:  amlodipine, prilosec, effexor   DO NOT TAKE ANY DIABETIC MEDICATIONS DAY OF YOUR SURGERY                               You may not have any metal on your body including hair pins and              piercings  Do not wear jewelry, make-up, lotions,  powders or perfumes, deodorant             Do  not wear nail polish on your fingernails.  Do not shave  48 hours prior to surgery.              Men may shave face and neck.   Do not bring valuables to the hospital. [place].  Contacts, dentures or bridgework may not be worn into surgery.  Leave suitcase in the car. After surgery it may be brought to your room.     Patients discharged the day of surgery will not be allowed to drive home. IF YOU ARE HAVING SURGERY AND GOING HOME THE SAME DAY, YOU MUST HAVE AN ADULT TO DRIVE YOU HOME AND BE WITH YOU FOR 24 HOURS. YOU MAY GO HOME BY TAXI OR UBER OR ORTHERWISE, BUT AN ADULT MUST ACCOMPANY YOU HOME AND STAY WITH YOU FOR 24 HOURS.  Name and phone number of your driver:  Special Instructions: N/A              Please read over the following fact sheets you were given: _____________________________________________________________________  [facility] - Preparing for Surgery Before surgery, you can play an important role.  Because skin is not sterile, your skin needs to be as free of germs as possible.  You can reduce the number of germs on your skin by washing with CHG (chlorahexidine gluconate) soap before surgery.  CHG is an antiseptic cleaner which kills germs and bonds with the skin to continue killing germs even after washing. Please DO NOT use if you have an allergy to CHG or antibacterial soaps.  If your skin becomes reddened/irritated stop using the CHG and inform your nurse when you arrive at Short Stay. Do not shave (including legs and underarms) for at least 48 hours prior to the first CHG shower.  You may shave your face/neck. Please follow these instructions carefully:  1.  Shower with CHG Soap the night before surgery and the  morning of Surgery.  2.  If you choose to wash your hair, wash your hair first as usual with your  normal  shampoo.  3.  After you shampoo, rinse your hair and body  thoroughly to remove the  shampoo.                           4.  Use CHG as you would any other liquid soap.  You can apply chg directly  to the skin and wash                       Gently with a scrungie or clean washcloth.  5.  Apply the CHG Soap to your body ONLY FROM THE NECK DOWN.   Do not use on face/ open                           Wound or open sores. Avoid contact with eyes, ears mouth and genitals (private parts).                       Wash face,  Genitals (private parts) with your normal soap.             6.  Wash thoroughly, paying special attention to the area where your surgery  will be performed.  7.  Thoroughly rinse your body with warm  water from the neck down.  8.  DO NOT shower/wash with your normal soap after using and rinsing off  the CHG Soap.                9.  Pat yourself dry with a clean towel.            10.  Wear clean pajamas.            11.  Place clean sheets on your bed the night of your first shower and do not  sleep with pets. Day of Surgery : Do not apply any lotions/deodorants the morning of surgery.  Please wear clean clothes to the hospital/surgery center.  FAILURE TO FOLLOW THESE INSTRUCTIONS MAY RESULT IN THE CANCELLATION OF YOUR SURGERY PATIENT SIGNATURE_________________________________  NURSE SIGNATURE__________________________________  ________________________________________________________________________

## 2021-02-09 ENCOUNTER — Other Ambulatory Visit: Payer: Self-pay

## 2021-02-09 ENCOUNTER — Encounter (HOSPITAL_COMMUNITY): Payer: Self-pay

## 2021-02-09 ENCOUNTER — Encounter (HOSPITAL_COMMUNITY)
Admission: RE | Admit: 2021-02-09 | Discharge: 2021-02-09 | Disposition: A | Discharge status: Home / Self Care | Payer: Medicare PPO | Source: Ambulatory Visit | Attending: Orthopedic Surgery | Admitting: Orthopedic Surgery

## 2021-02-09 DIAGNOSIS — Y939 Activity, unspecified: Secondary | ICD-10-CM | POA: Diagnosis not present

## 2021-02-09 DIAGNOSIS — Z01812 Encounter for preprocedural laboratory examination: Secondary | ICD-10-CM | POA: Diagnosis not present

## 2021-02-09 DIAGNOSIS — Z7902 Long term (current) use of antithrombotics/antiplatelets: Secondary | ICD-10-CM | POA: Insufficient documentation

## 2021-02-09 DIAGNOSIS — I10 Essential (primary) hypertension: Secondary | ICD-10-CM | POA: Diagnosis not present

## 2021-02-09 DIAGNOSIS — K219 Gastro-esophageal reflux disease without esophagitis: Secondary | ICD-10-CM | POA: Insufficient documentation

## 2021-02-09 DIAGNOSIS — S42202A Unspecified fracture of upper end of left humerus, initial encounter for closed fracture: Secondary | ICD-10-CM | POA: Diagnosis not present

## 2021-02-09 DIAGNOSIS — Z7984 Long term (current) use of oral hypoglycemic drugs: Secondary | ICD-10-CM | POA: Diagnosis not present

## 2021-02-09 DIAGNOSIS — Y929 Unspecified place or not applicable: Secondary | ICD-10-CM | POA: Diagnosis not present

## 2021-02-09 DIAGNOSIS — E119 Type 2 diabetes mellitus without complications: Secondary | ICD-10-CM | POA: Insufficient documentation

## 2021-02-09 DIAGNOSIS — Z79899 Other long term (current) drug therapy: Secondary | ICD-10-CM | POA: Diagnosis not present

## 2021-02-09 DIAGNOSIS — Z7901 Long term (current) use of anticoagulants: Secondary | ICD-10-CM | POA: Diagnosis not present

## 2021-02-09 DIAGNOSIS — G4730 Sleep apnea, unspecified: Secondary | ICD-10-CM | POA: Insufficient documentation

## 2021-02-09 HISTORY — DX: Nausea with vomiting, unspecified: R11.2

## 2021-02-09 HISTORY — DX: Other specified postprocedural states: Z98.890

## 2021-02-09 LAB — SURGICAL PCR SCREEN
MRSA, PCR: NEGATIVE
Staphylococcus aureus: NEGATIVE

## 2021-02-09 LAB — BASIC METABOLIC PANEL
Anion gap: 6 (ref 5–15)
BUN: 22 mg/dL (ref 8–23)
CO2: 26 mmol/L (ref 22–32)
Calcium: 9.3 mg/dL (ref 8.9–10.3)
Chloride: 104 mmol/L (ref 98–111)
Creatinine, Ser: 0.52 mg/dL (ref 0.44–1.00)
GFR, Estimated: >60 mL/min (ref >60)
Glucose, Bld: 160 mg/dL — ABNORMAL HIGH (ref 70–99)
Potassium: 4.9 mmol/L (ref 3.5–5.1)
Sodium: 136 mmol/L (ref 135–145)

## 2021-02-09 LAB — CBC
HCT: 41.1 % (ref 36.0–46.0)
Hemoglobin: 12.9 g/dL (ref 12.0–15.0)
MCH: 27.3 pg (ref 26.0–34.0)
MCHC: 31.4 g/dL (ref 30.0–36.0)
MCV: 87.1 fL (ref 80.0–100.0)
Platelets: 401 10*3/uL — ABNORMAL HIGH (ref 150–400)
RBC: 4.72 MIL/uL (ref 3.87–5.11)
RDW: 13.8 % (ref 11.5–15.5)
WBC: 11.5 10*3/uL — ABNORMAL HIGH (ref 4.0–10.5)
nRBC: 0 % (ref 0.0–0.2)

## 2021-02-09 LAB — HEMOGLOBIN A1C
Hgb A1c MFr Bld: 6.5 % — ABNORMAL HIGH (ref 4.8–5.6)
Mean Plasma Glucose: 139.85 mg/dL

## 2021-02-09 LAB — GLUCOSE, CAPILLARY: Glucose-Capillary: 153 mg/dL — ABNORMAL HIGH (ref 70–99)

## 2021-02-09 NOTE — Progress Notes (Addendum)
Anesthesia Review:  PCP:  DR Garret Reddish- 02/01/21-LOV  Cardiologist : Dr Gwyndolyn Kaufman  LOV 08/30/20- Dr Wilhemina Bonito Duke ,PA- Clearance in telepohone encounter  dated 02/07/21.   Pulm-LOV 01/26/21- Tammy Parrett,NP  Neuro- DR Tomi Likens LOV 07/20/20.   Chest x-ray : EKG : 02/05/21  Echo : 08/21/20  Stress test: Cardiac Cath :  Activity level: can do a flight of stairs without difficulty  Sleep Study/ CPAP : has cpap  Fasting Blood Sugar :      / Checks Blood Sugar -- times a day:   Blood Thinner/ Instructions /Last Dose: ASA / Instructions/ Last Dose :   No covid  test due to amb surgery  Hgba1c-01/25/21-6.3 02/09/21-6.5 DM- type 2  Checks glucose once dialy per pt  Plavix- stop 5 days prior to surgery per pt  HX o fTIA- followed by DR Tomi Likens.  Temp-99.5 at preop appt.  Pt denies any fevers at home.  PT denies any covid symptoms.  PT reports having allergies and stuffy nose which she uses nasal spray for.   PT reports " The masks make me have a runny nose and stuffy.".   CBC faxed to DR Mardelle Matte done  02/09/21.

## 2021-02-09 NOTE — Progress Notes (Signed)
[facility]- Preparing for Total Shoulder Arthroplasty  °  °Before surgery, you can play an important role. Because skin is not sterile, your skin needs to be as free of germs as possible. You can reduce the number of germs on your skin by using the following products. °Benzoyl Peroxide Gel °Reduces the number of germs present on the skin °Applied twice a day to shoulder area starting two days before surgery   ° °================================================================== ° °Please follow these instructions carefully: ° °BENZOYL PEROXIDE 5% GEL ° °Please do not use if you have an allergy to benzoyl peroxide.   If your skin becomes reddened/irritated stop using the benzoyl peroxide. ° °Starting two days before surgery, apply as follows: °Apply benzoyl peroxide in the morning and at night. Apply after taking a shower. If you are not taking a shower clean entire shoulder front, back, and side along with the armpit with a clean wet washcloth. ° °Place a quarter-sized dollop on your shoulder and rub in thoroughly, making sure to cover the front, back, and side of your shoulder, along with the armpit.  ° °2 days before ____ AM   ____ PM              1 day before ____ AM   ____ PM °                        °Do this twice a day for two days.  (Last application is the night before surgery, AFTER using the CHG soap as described below). ° °Do NOT apply benzoyl peroxide gel on the day of surgery.  °

## 2021-02-12 ENCOUNTER — Telehealth: Payer: Self-pay

## 2021-02-12 DIAGNOSIS — G4733 Obstructive sleep apnea (adult) (pediatric): Secondary | ICD-10-CM

## 2021-02-12 NOTE — Progress Notes (Signed)
Anesthesia Chart Review   Case: [id number] Date/Time: 02/13/21 1035   Procedure: REVERSE SHOULDER ARTHROPLASTY (Left: Shoulder)   Anesthesia type: Choice   Pre-op diagnosis: left proximal humerus fracture   Location: [facility]   Surgeons: Marchia Bond, MD       DISCUSSION:70 y.o. never smoker with h/o PONV, HTN, GERD, DM II (A1C 6.5 02/09/2021), sleep apnea with CPAP use, s/p AV replacement 08/23/2016, left proximal humerus fracture scheduled for above procedure 02/13/2021 with Dr. Marchia Bond.   Echo 08/21/2020 with stable normal LVEF and mild to moderate paravalvular leak.   Per cardiology preoperative evaluation 02/07/2021, "Chart reviewed as part of pre-operative protocol coverage. Patient was contacted 02/07/2021 in reference to pre-operative risk assessment for pending surgery as outlined below.  Shellby P Kuchera was last seen on 08/30/20 by Dr. Meda Coffee.  Since that day, WINNELL BENTO has done well. Just prior to her shoulder injury she was able to complete more than 4.0 METS exercising at the gym. Pt had a SAVR in 2018 and heart cath prior to surgery showed no obstructive CAD.    Therefore, based on ACC/AHA guidelines, the patient would be at acceptable risk for the planned procedure without further cardiovascular testing. "  Pt was advised to hold Plavix 5 days prior to procedure.  VS: BP (!) 147/88   Pulse 86   Temp 37.5 C (Oral)   Resp 16   Ht $R'5\' 6"'dR$  (1.676 m)   LMP  (LMP Unknown)   SpO2 97%   BMI 32.93 kg/m   PROVIDERS: [name], MD is PCP   Primary Cardiologist: Freada Bergeron, MD  LABS: Labs reviewed: Acceptable for surgery. (all labs ordered are listed, but only abnormal results are displayed)  Labs Reviewed  HEMOGLOBIN A1C - Abnormal; Notable for the following components:      Result Value   Hgb A1c MFr Bld 6.5 (*)    All other components within normal limits  BASIC METABOLIC PANEL - Abnormal; Notable for the following components:    Glucose, Bld 160 (*)    All other components within normal limits  CBC - Abnormal; Notable for the following components:   WBC 11.5 (*)    Platelets 401 (*)    All other components within normal limits  GLUCOSE, CAPILLARY - Abnormal; Notable for the following components:   Glucose-Capillary 153 (*)    All other components within normal limits  SURGICAL PCR SCREEN     IMAGES:   EKG: 02/05/2021 Rate 60 bpm  NSR Nonspecific ST and T wave abnormality   CV: Echo 08/21/2020  1. Left ventricular ejection fraction, by estimation, is 60 to 65%. Left  ventricular ejection fraction by 3D volume is 59 %. The left ventricle has  normal function. The left ventricle has no regional wall motion  abnormalities. Left ventricular diastolic   parameters are consistent with Grade II diastolic dysfunction  (pseudonormalization).   2. Right ventricular systolic function is normal. The right ventricular  size is normal. There is normal pulmonary artery systolic pressure. The  estimated right ventricular systolic pressure is 96.7 mmHg.   3. The mitral valve is normal in structure. Mild mitral valve  regurgitation. No evidence of mitral stenosis.   4. The aortic valve has been repaired/replaced. There is a 25 mm Edwards  Intuit valve present in the aortic position. Aortic valve regurgitation is  not visualized. No aortic stenosis is present. There is moderate  perivalvular aortic insufficiency.  Aortic  regurgitation PHT measures 406 msec. Aortic valve mean gradient  measures 14.0 mmHg. Aortic valve peak gradient measures 25.8 mmHg. Aortic  valve area, by VTI measures 1.93 cm.   5. Aortic dilatation noted. There is borderline dilatation of the  ascending aorta, measuring 37 mm.   6. The inferior vena cava is normal in size with greater than 50%  respiratory variability, suggesting right atrial pressure of 3 mmHg.   7. Left atrial size was severely dilated.  Stress Test 01/05/2015 Nuclear stress  EF: 60%. The study is normal. This is a low risk study. The left ventricular ejection fraction is normal (55-65%).   Normal resting and stress perfusion EF 60%    Past Medical History:  Diagnosis Date   Allergy    Anxiety    Arthritis    back & knee   Asthma     mild per pt shows up with resp illness   Colon polyps    Constipation    Diabetes mellitus without complication (HCC)    Gallstones    Gastric polyps    Gastroparesis    GERD (gastroesophageal reflux disease)    Headache    sinus headaches and migraines at times   Heart murmur    History of migraine headaches    HTN (hypertension)    Hyperlipidemia    IBS (irritable bowel syndrome)    Joint pain    Lumbar disc disease    PONV (postoperative nausea and vomiting)    S/P aortic valve replacement with bioprosthetic valve 08/23/2016   25 mm Edwards Intuity rapid-deployment bovine pericardial tissue valve via partial upper mini sternotomy   Sleep apnea    CPAP   TIA (transient ischemic attack)    TIA (transient ischemic attack)    hx of per pt    Past Surgical History:  Procedure Laterality Date   ABDOMINAL HYSTERECTOMY     AORTIC VALVE REPLACEMENT N/A 08/23/2016   Procedure: AORTIC VALVE REPLACEMENT (AVR) - using partial Upper Sternotomy- 73m Edwards Intuity Aortic Valve used;  Surgeon: CRexene Alberts MD;  [place]  Service: Open Heart Surgery;  Laterality: N/A;   BLADDER SUSPENSION  2007   BREAST ENHANCEMENT SURGERY  1980   CARPAL TUNNEL RELEASE Bilateral    COLONOSCOPY     DORSAL COMPARTMENT RELEASE Right 09/15/2014   Procedure: RIGHT WRIST DEQUERVAINS RELEASE ;  Surgeon: DKathryne Hitch MD;  [place] [facility]  Service: Orthopedics;  Laterality: Right;   ESOPHAGOGASTRODUODENOSCOPY     EYE SURGERY     cataract surgery   KNEE ARTHROSCOPY  04/15/2012   Procedure: ARTHROSCOPY KNEE;  Surgeon: DNinetta Lights MD;  [place] [facility]  Service: Orthopedics;  Laterality: Right;    LAPAROSCOPIC CHOLECYSTECTOMY     LASIK     OOPHORECTOMY     PALATE TO GINGIVA GRAFT  2017   RIGHT/LEFT HEART CATH AND CORONARY ANGIOGRAPHY N/A 08/02/2016   Procedure: Right/Left Heart Cath and Coronary Angiography;  Surgeon: MSherren Mocha MD;  [facility];  Service: Cardiovascular;  Laterality: N/A;   ROTATOR CUFF REPAIR Left    TEE WITHOUT CARDIOVERSION N/A 08/23/2016   Procedure: TRANSESOPHAGEAL ECHOCARDIOGRAM (TEE);  Surgeon: CRexene Alberts MD;  [place] [facility]  Service: Open Heart Surgery;  Laterality: N/A;   TOTAL KNEE ARTHROPLASTY  04/15/2012   Procedure: TOTAL KNEE ARTHROPLASTY;  Surgeon: DNinetta Lights MD;  [place]  Service: Orthopedics;  Laterality: Right;   TRIGGER FINGER RELEASE Right  04/20/2015   Procedure: RIGHT TRIGGER FINGER RELEASE (TENDON SHEALTH INCISION) ;  Surgeon: [name], MD;  Location: [facility];  Service: Orthopedics;  Laterality: Right;    MEDICATIONS:  ACCU-CHEK GUIDE test strip   Accu-Chek Softclix Lancets lancets   acetaminophen (TYLENOL) 325 MG tablet   amLODipine (NORVASC) 10 MG tablet   atorvastatin (LIPITOR) 40 MG tablet   Biotin 5000 MCG TABS   blood glucose meter kit and supplies KIT   calcium-vitamin D (OSCAL WITH D) 500-200 MG-UNIT per tablet   Cholecalciferol (VITAMIN D) 125 MCG (5000 UT) CAPS   cloNIDine (CATAPRES) 0.1 MG tablet   clopidogrel (PLAVIX) 75 MG tablet   demeclocycline (DECLOMYCIN) 300 MG tablet   docusate sodium (COLACE) 100 MG capsule   fluticasone (FLONASE) 50 MCG/ACT nasal spray   ibuprofen (ADVIL) 600 MG tablet   ipratropium (ATROVENT) 0.03 % nasal spray   irbesartan (AVAPRO) 300 MG tablet   loratadine (CLARITIN) 10 MG tablet   losartan (COZAAR) 100 MG tablet   Melatonin 5 MG TABS   metFORMIN (GLUCOPHAGE) 500 MG tablet   metoCLOPramide (REGLAN) 10 MG tablet   Multiple Vitamin (MULTIVITAMIN WITH MINERALS) TABS tablet   naproxen sodium (ALEVE) 220 MG tablet   omeprazole  (PRILOSEC) 40 MG capsule   ondansetron (ZOFRAN ODT) 4 MG disintegrating tablet   oxyCODONE (ROXICODONE) 5 MG immediate release tablet   Probiotic Product (ALIGN) 4 MG CAPS   psyllium (METAMUCIL SMOOTH TEXTURE) 28 % packet   senna-docusate (SENOKOT-S) 8.6-50 MG tablet   sodium chloride 1 g tablet   venlafaxine XR (EFFEXOR-XR) 75 MG 24 hr capsule   No current facility-administered medications for this encounter.     Konrad Felix Ward, PA-C [facility] [phone]

## 2021-02-12 NOTE — Anesthesia Preprocedure Evaluation (Addendum)
Anesthesia Evaluation  Patient identified by MRN, date of birth, ID band Patient awake    Reviewed: Allergy & Precautions, NPO status , Patient's Chart, lab work & pertinent test results  History of Anesthesia Complications (+) PONV and history of anesthetic complications  Airway Mallampati: II  TM Distance: >3 FB Neck ROM: Full    Dental  (+) Dental Advisory Given, Teeth Intact   Pulmonary asthma , sleep apnea and Continuous Positive Airway Pressure Ventilation ,    Pulmonary exam normal        Cardiovascular hypertension, Pt. on medications Normal cardiovascular exam+ Valvular Problems/Murmurs (s/p AVR) AI    '22 TTE - EF 60 to 65%. Grade II diastolic dysfunction  (pseudonormalization). Mild MR. The aortic valve has been repaired/replaced. There is a 25 mm Edwards Intuit valve present in the aortic position. Moderate perivalvular aortic insufficiency. Aortic regurgitation PHT measures 406 msec. Aortic valve mean gradient measures 14.0 mmHg. Aortic valve peak gradient measures 25.8 mmHg. Aortic valve area, by VTI measures 1.93 cm. There is borderline dilatation of the ascending aorta, measuring 37 mm. Left atrial size was severely dilated.     Neuro/Psych  Headaches, PSYCHIATRIC DISORDERS Anxiety TIA   GI/Hepatic Neg liver ROS, GERD  Medicated and Controlled, IBS    Endo/Other  diabetes, Type 2, Oral Hypoglycemic Agents Obesity   Renal/GU negative Renal ROS     Musculoskeletal  (+) Arthritis ,   Abdominal   Peds  Hematology  On plavix, last dose 5 days ago     Anesthesia Other Findings   Reproductive/Obstetrics                           Anesthesia Physical Anesthesia Plan  ASA: 3  Anesthesia Plan: General   Post-op Pain Management:  Regional for Post-op pain   Induction: Intravenous  PONV Risk Score and Plan: 4 or greater and Treatment may vary due to age or medical condition,  Ondansetron and Aprepitant  Airway Management Planned: Oral ETT  Additional Equipment: None  Intra-op Plan:   Post-operative Plan: Extubation in OR  Informed Consent: I have reviewed the patients History and Physical, chart, labs and discussed the procedure including the risks, benefits and alternatives for the proposed anesthesia with the patient or authorized representative who has indicated his/her understanding and acceptance.     Dental advisory given  Plan Discussed with: CRNA and Anesthesiologist  Anesthesia Plan Comments:       Anesthesia Quick Evaluation

## 2021-02-12 NOTE — Telephone Encounter (Signed)
FYI: Called and spoke to patient about scheduling her for a home sleep study , patient states she is okay with having a home sleep study but requested it be after her shoulder has time to heal, since she will be having surgery on 8/23. I placed the home sleep study, nothing further needed.

## 2021-02-13 ENCOUNTER — Ambulatory Visit (HOSPITAL_COMMUNITY): Payer: Medicare PPO

## 2021-02-13 ENCOUNTER — Encounter (HOSPITAL_COMMUNITY): Payer: Self-pay | Admitting: Orthopedic Surgery

## 2021-02-13 ENCOUNTER — Encounter (HOSPITAL_COMMUNITY)
Admission: RE | Disposition: A | Discharge status: Home / Self Care | Payer: Self-pay | Source: Home / Self Care | Attending: Orthopedic Surgery

## 2021-02-13 ENCOUNTER — Ambulatory Visit (HOSPITAL_COMMUNITY): Payer: Medicare PPO | Admitting: Certified Registered"

## 2021-02-13 ENCOUNTER — Ambulatory Visit (HOSPITAL_COMMUNITY)
Admission: RE | Admit: 2021-02-13 | Discharge: 2021-02-13 | Disposition: A | Discharge status: Home / Self Care | Payer: Medicare PPO | Attending: Orthopedic Surgery | Admitting: Orthopedic Surgery

## 2021-02-13 ENCOUNTER — Ambulatory Visit (INDEPENDENT_AMBULATORY_CARE_PROVIDER_SITE_OTHER): Payer: Medicare PPO | Admitting: Family Medicine

## 2021-02-13 ENCOUNTER — Ambulatory Visit (HOSPITAL_COMMUNITY): Payer: Medicare PPO | Admitting: Emergency Medicine

## 2021-02-13 DIAGNOSIS — Z953 Presence of xenogenic heart valve: Secondary | ICD-10-CM | POA: Insufficient documentation

## 2021-02-13 DIAGNOSIS — E1143 Type 2 diabetes mellitus with diabetic autonomic (poly)neuropathy: Secondary | ICD-10-CM | POA: Diagnosis not present

## 2021-02-13 DIAGNOSIS — G8918 Other acute postprocedural pain: Secondary | ICD-10-CM | POA: Diagnosis not present

## 2021-02-13 DIAGNOSIS — Z7984 Long term (current) use of oral hypoglycemic drugs: Secondary | ICD-10-CM | POA: Insufficient documentation

## 2021-02-13 DIAGNOSIS — Z881 Allergy status to other antibiotic agents status: Secondary | ICD-10-CM | POA: Diagnosis not present

## 2021-02-13 DIAGNOSIS — Z880 Allergy status to penicillin: Secondary | ICD-10-CM | POA: Insufficient documentation

## 2021-02-13 DIAGNOSIS — Z8673 Personal history of transient ischemic attack (TIA), and cerebral infarction without residual deficits: Secondary | ICD-10-CM | POA: Insufficient documentation

## 2021-02-13 DIAGNOSIS — Z01818 Encounter for other preprocedural examination: Secondary | ICD-10-CM | POA: Diagnosis not present

## 2021-02-13 DIAGNOSIS — F419 Anxiety disorder, unspecified: Secondary | ICD-10-CM | POA: Diagnosis not present

## 2021-02-13 DIAGNOSIS — E785 Hyperlipidemia, unspecified: Secondary | ICD-10-CM | POA: Insufficient documentation

## 2021-02-13 DIAGNOSIS — K219 Gastro-esophageal reflux disease without esophagitis: Secondary | ICD-10-CM | POA: Diagnosis not present

## 2021-02-13 DIAGNOSIS — Z471 Aftercare following joint replacement surgery: Secondary | ICD-10-CM | POA: Diagnosis not present

## 2021-02-13 DIAGNOSIS — S42202A Unspecified fracture of upper end of left humerus, initial encounter for closed fracture: Secondary | ICD-10-CM | POA: Insufficient documentation

## 2021-02-13 DIAGNOSIS — G4730 Sleep apnea, unspecified: Secondary | ICD-10-CM | POA: Diagnosis not present

## 2021-02-13 DIAGNOSIS — Z96612 Presence of left artificial shoulder joint: Secondary | ICD-10-CM

## 2021-02-13 DIAGNOSIS — Z7902 Long term (current) use of antithrombotics/antiplatelets: Secondary | ICD-10-CM | POA: Insufficient documentation

## 2021-02-13 DIAGNOSIS — E559 Vitamin D deficiency, unspecified: Secondary | ICD-10-CM | POA: Diagnosis not present

## 2021-02-13 DIAGNOSIS — Z833 Family history of diabetes mellitus: Secondary | ICD-10-CM | POA: Insufficient documentation

## 2021-02-13 DIAGNOSIS — Z9889 Other specified postprocedural states: Secondary | ICD-10-CM | POA: Diagnosis not present

## 2021-02-13 DIAGNOSIS — Z79899 Other long term (current) drug therapy: Secondary | ICD-10-CM | POA: Insufficient documentation

## 2021-02-13 HISTORY — PX: REVERSE SHOULDER ARTHROPLASTY: SHX5054

## 2021-02-13 LAB — GLUCOSE, CAPILLARY
Glucose-Capillary: 193 mg/dL — ABNORMAL HIGH (ref 70–99)
Glucose-Capillary: 197 mg/dL — ABNORMAL HIGH (ref 70–99)

## 2021-02-13 SURGERY — ARTHROPLASTY, SHOULDER, TOTAL, REVERSE
Anesthesia: General | Site: Shoulder | Laterality: Left

## 2021-02-13 MED ORDER — FENTANYL CITRATE (PF) 100 MCG/2ML IJ SOLN
INTRAMUSCULAR | Status: DC | PRN
  Administered 2021-02-13: 100 ug via INTRAVENOUS

## 2021-02-13 MED ORDER — OXYCODONE HCL 5 MG/5ML PO SOLN: 5.0000 mg | Freq: Once | ORAL | Status: DC | PRN

## 2021-02-13 MED ORDER — BUPIVACAINE HCL (PF) 0.5 % IJ SOLN
INTRAMUSCULAR | Status: DC | PRN
  Administered 2021-02-13: 15 mL via PERINEURAL

## 2021-02-13 MED ORDER — CHLORHEXIDINE GLUCONATE 0.12 % MT SOLN
15.0000 mL | Freq: Once | OROMUCOSAL | Status: AC
  Administered 2021-02-13: 15 mL via OROMUCOSAL

## 2021-02-13 MED ORDER — CEFAZOLIN SODIUM-DEXTROSE 2-4 GM/100ML-% IV SOLN: 2.0000 g | Freq: Four times a day (QID) | INTRAVENOUS | Status: DC

## 2021-02-13 MED ORDER — ONDANSETRON HCL 4 MG PO TABS: 4.0000 mg | ORAL_TABLET | Freq: Three times a day (TID) | ORAL | 0 refills | Status: DC | PRN

## 2021-02-13 MED ORDER — CEFAZOLIN SODIUM-DEXTROSE 2-4 GM/100ML-% IV SOLN
2.0000 g | INTRAVENOUS | Status: AC
  Administered 2021-02-13: 2 g via INTRAVENOUS
  Filled 2021-02-13: qty 100

## 2021-02-13 MED ORDER — ROCURONIUM BROMIDE 10 MG/ML (PF) SYRINGE
PREFILLED_SYRINGE | INTRAVENOUS | Status: DC | PRN
  Administered 2021-02-13: 60 mg via INTRAVENOUS

## 2021-02-13 MED ORDER — LACTATED RINGERS IV BOLUS
500.0000 mL | Freq: Once | INTRAVENOUS | Status: AC
  Administered 2021-02-13: 500 mL via INTRAVENOUS

## 2021-02-13 MED ORDER — ACETAMINOPHEN 500 MG PO TABS
1000.0000 mg | ORAL_TABLET | Freq: Once | ORAL | Status: AC
  Administered 2021-02-13: 1000 mg via ORAL
  Filled 2021-02-13: qty 2

## 2021-02-13 MED ORDER — PHENYLEPHRINE HCL-NACL 20-0.9 MG/250ML-% IV SOLN
INTRAVENOUS | Status: DC | PRN
  Administered 2021-02-13: 85 ug/min via INTRAVENOUS

## 2021-02-13 MED ORDER — ONDANSETRON HCL 4 MG/2ML IJ SOLN: 4.0000 mg | Freq: Once | INTRAMUSCULAR | Status: DC | PRN

## 2021-02-13 MED ORDER — SUGAMMADEX SODIUM 200 MG/2ML IV SOLN
INTRAVENOUS | Status: DC | PRN
  Administered 2021-02-13: 200 mg via INTRAVENOUS

## 2021-02-13 MED ORDER — BUPIVACAINE LIPOSOME 1.3 % IJ SUSP
INTRAMUSCULAR | Status: DC | PRN
  Administered 2021-02-13: 10 mL via PERINEURAL

## 2021-02-13 MED ORDER — ORAL CARE MOUTH RINSE: 15.0000 mL | Freq: Once | OROMUCOSAL | Status: AC

## 2021-02-13 MED ORDER — 0.9 % SODIUM CHLORIDE (POUR BTL) OPTIME
TOPICAL | Status: DC | PRN
  Administered 2021-02-13: 1000 mL

## 2021-02-13 MED ORDER — LACTATED RINGERS IV BOLUS
250.0000 mL | Freq: Once | INTRAVENOUS | Status: AC
  Administered 2021-02-13: 250 mL via INTRAVENOUS

## 2021-02-13 MED ORDER — LACTATED RINGERS IV SOLN: INTRAVENOUS | Status: DC

## 2021-02-13 MED ORDER — LIDOCAINE 2% (20 MG/ML) 5 ML SYRINGE
INTRAMUSCULAR | Status: DC | PRN
  Administered 2021-02-13: 40 mg via INTRAVENOUS

## 2021-02-13 MED ORDER — PROPOFOL 10 MG/ML IV BOLUS
INTRAVENOUS | Status: DC | PRN
  Administered 2021-02-13: 30 mg via INTRAVENOUS
  Administered 2021-02-13: 120 mg via INTRAVENOUS

## 2021-02-13 MED ORDER — ONDANSETRON HCL 4 MG/2ML IJ SOLN
INTRAMUSCULAR | Status: DC | PRN
  Administered 2021-02-13: 4 mg via INTRAVENOUS

## 2021-02-13 MED ORDER — FENTANYL CITRATE (PF) 100 MCG/2ML IJ SOLN
50.0000 ug | Freq: Once | INTRAMUSCULAR | Status: AC
  Administered 2021-02-13: 100 ug via INTRAVENOUS
  Filled 2021-02-13: qty 2

## 2021-02-13 MED ORDER — GLYCOPYRROLATE 0.2 MG/ML IJ SOLN
INTRAMUSCULAR | Status: DC | PRN
  Administered 2021-02-13: .2 mg via INTRAVENOUS

## 2021-02-13 MED ORDER — FENTANYL CITRATE (PF) 100 MCG/2ML IJ SOLN: 25.0000 ug | INTRAMUSCULAR | Status: DC | PRN

## 2021-02-13 MED ORDER — PHENYLEPHRINE HCL (PRESSORS) 10 MG/ML IV SOLN
INTRAVENOUS | Status: AC
  Filled 2021-02-13: qty 2

## 2021-02-13 MED ORDER — OXYCODONE HCL 5 MG PO TABS: 5.0000 mg | ORAL_TABLET | Freq: Four times a day (QID) | ORAL | 0 refills | Status: DC | PRN

## 2021-02-13 MED ORDER — EPHEDRINE SULFATE-NACL 50-0.9 MG/10ML-% IV SOSY
PREFILLED_SYRINGE | INTRAVENOUS | Status: DC | PRN
  Administered 2021-02-13: 10 mg via INTRAVENOUS
  Administered 2021-02-13 (×2): 5 mg via INTRAVENOUS

## 2021-02-13 MED ORDER — OXYCODONE HCL 5 MG PO TABS: 5.0000 mg | ORAL_TABLET | Freq: Once | ORAL | Status: DC | PRN

## 2021-02-13 MED ORDER — STERILE WATER FOR IRRIGATION IR SOLN
Status: DC | PRN
  Administered 2021-02-13: 2000 mL

## 2021-02-13 MED ORDER — MIDAZOLAM HCL 2 MG/2ML IJ SOLN
1.0000 mg | Freq: Once | INTRAMUSCULAR | Status: AC
  Administered 2021-02-13: 2 mg via INTRAVENOUS
  Filled 2021-02-13: qty 2

## 2021-02-13 MED ORDER — FENTANYL CITRATE (PF) 100 MCG/2ML IJ SOLN
INTRAMUSCULAR | Status: AC
  Filled 2021-02-13: qty 2

## 2021-02-13 SURGICAL SUPPLY — 69 items
AID PSTN UNV HD RSTRNT DISP (MISCELLANEOUS) ×1
BAG COUNTER SPONGE SURGICOUNT (BAG) IMPLANT
BAG SPEC THK2 15X12 ZIP CLS (MISCELLANEOUS) ×1
BAG SPNG CNTER NS LX DISP (BAG)
BAG SURGICOUNT SPONGE COUNTING (BAG)
BAG ZIPLOCK 12X15 (MISCELLANEOUS) ×3 IMPLANT
BASEPLATE GLENOSPHERE 25 (Plate) ×1 IMPLANT
BASEPLATE GLENOSPHERE 25MM (Plate) ×1 IMPLANT
BEARING HUMERAL SHLDER 36M STD (Shoulder) IMPLANT
BIT DRILL TWIST 2.7 (BIT) ×1 IMPLANT
BIT DRILL TWIST 2.7MM (BIT) ×1
BLADE SAW SAG 73X25 THK (BLADE) ×2
BLADE SAW SGTL 73X25 THK (BLADE) ×1 IMPLANT
BOOTIES KNEE HIGH SLOAN (MISCELLANEOUS) ×3 IMPLANT
BOWL SMART MIX CTS (DISPOSABLE) IMPLANT
BRNG HUM STD 36 RVRS SHLDR (Shoulder) ×1 IMPLANT
CLOSURE STERI-STRIP 1/2X4 (GAUZE/BANDAGES/DRESSINGS) ×1
CLOSURE WOUND 1/2 X4 (GAUZE/BANDAGES/DRESSINGS) ×1
CLSR STERI-STRIP ANTIMIC 1/2X4 (GAUZE/BANDAGES/DRESSINGS) ×2 IMPLANT
COVER BACK TABLE 60X90IN (DRAPES) ×3 IMPLANT
COVER MAYO STAND STRL (DRAPES) ×3 IMPLANT
COVER SURGICAL LIGHT HANDLE (MISCELLANEOUS) ×3 IMPLANT
DECANTER SPIKE VIAL GLASS SM (MISCELLANEOUS) IMPLANT
DRAPE ORTHO SPLIT 77X108 STRL (DRAPES)
DRAPE SHEET LG 3/4 BI-LAMINATE (DRAPES) ×6 IMPLANT
DRAPE SURG 17X11 SM STRL (DRAPES) ×3 IMPLANT
DRAPE SURG ORHT 6 SPLT 77X108 (DRAPES) IMPLANT
DRAPE U-SHAPE 47X51 STRL (DRAPES) ×3 IMPLANT
DRSG MEPILEX BORDER 4X8 (GAUZE/BANDAGES/DRESSINGS) ×3 IMPLANT
DRSG MEPITEL 8X12 (GAUZE/BANDAGES/DRESSINGS) ×1 IMPLANT
DURAPREP 26ML APPLICATOR (WOUND CARE) ×6 IMPLANT
ELECT REM PT RETURN 15FT ADLT (MISCELLANEOUS) ×3 IMPLANT
GLENOID SPHERE STD STRL 36MM (Orthopedic Implant) ×2 IMPLANT
GLOVE SRG 8 PF TXTR STRL LF DI (GLOVE) ×1 IMPLANT
GLOVE SURG ENC MOIS LTX SZ7 (GLOVE) ×3 IMPLANT
GLOVE SURG ORTHO LTX SZ7.5 (GLOVE) ×3 IMPLANT
GLOVE SURG UNDER POLY LF SZ7 (GLOVE) ×3 IMPLANT
GLOVE SURG UNDER POLY LF SZ8 (GLOVE) ×3
GOWN STRL REUS W/TWL 2XL LVL3 (GOWN DISPOSABLE) ×3 IMPLANT
GOWN STRL REUS W/TWL LRG LVL3 (GOWN DISPOSABLE) ×3 IMPLANT
HOOD PEEL AWAY FLYTE STAYCOOL (MISCELLANEOUS) ×6 IMPLANT
KIT BASIN OR (CUSTOM PROCEDURE TRAY) ×3 IMPLANT
KIT TURNOVER KIT A (KITS) ×3 IMPLANT
MEPITEL 8X12 (GAUZE/BANDAGES/DRESSINGS) ×1
PACK SHOULDER (CUSTOM PROCEDURE TRAY) ×3 IMPLANT
PIN THREADED REVERSE (PIN) ×2 IMPLANT
PROTECTOR NERVE ULNAR (MISCELLANEOUS) ×3 IMPLANT
RESTRAINT HEAD UNIVERSAL NS (MISCELLANEOUS) ×3 IMPLANT
SCREW BONE LOCKING 4.75X30X3.5 (Screw) ×2 IMPLANT
SCREW BONE STRL 6.5MMX25MM (Screw) ×2 IMPLANT
SCREW LOCKING 4.75MMX15MM (Screw) ×4 IMPLANT
SCREW LOCKING STRL 4.75X25X3.5 (Screw) ×2 IMPLANT
SHOULDER HUMERAL BEAR 36M STD (Shoulder) ×3 IMPLANT
SLING ARM IMMOBILIZER LRG (SOFTGOODS) ×3 IMPLANT
STEM HUMERAL STRL 9MMX83MM (Stem) ×2 IMPLANT
STRIP CLOSURE SKIN 1/2X4 (GAUZE/BANDAGES/DRESSINGS) ×1 IMPLANT
SUCTION FRAZIER HANDLE 12FR (TUBING) ×3
SUCTION TUBE FRAZIER 12FR DISP (TUBING) ×1 IMPLANT
SUPPORT WRAP ARM LG (MISCELLANEOUS) ×3 IMPLANT
SUT MAXBRAID #2 CVD NDL (SUTURE) ×2 IMPLANT
SUT MAXBRAID #5 CCS-NDL 2PK (SUTURE) ×6 IMPLANT
SUT VIC AB 1 CT1 36 (SUTURE) ×3 IMPLANT
SUT VIC AB 2-0 CT1 27 (SUTURE) ×3
SUT VIC AB 2-0 CT1 TAPERPNT 27 (SUTURE) ×1 IMPLANT
SUT VIC AB 3-0 SH 8-18 (SUTURE) ×3 IMPLANT
TOWEL OR 17X26 10 PK STRL BLUE (TOWEL DISPOSABLE) ×3 IMPLANT
TOWEL OR NON WOVEN STRL DISP B (DISPOSABLE) ×3 IMPLANT
TOWER CARTRIDGE SMART MIX (DISPOSABLE) IMPLANT
TRAY HUM MINI SHOULDER +3 40 (Joint) ×2 IMPLANT

## 2021-02-13 NOTE — Op Note (Signed)
02/13/2021  4:34 PM  PATIENT:  [name]    PRE-OPERATIVE DIAGNOSIS: Left proximal humerus fracture, with significant impaction and displacement  POST-OPERATIVE DIAGNOSIS:  Same  PROCEDURE: LEFT reverse Total Shoulder Arthroplasty  SURGEON:  Johnny Bridge, MD  PHYSICIAN ASSISTANT: Merlene Pulling, PA-C, present and scrubbed throughout the case, critical for completion in a timely fashion, and for retraction, instrumentation, and closure.  ANESTHESIA:   General with interscalene block using Exparel  ESTIMATED BLOOD LOSS: 150 mL  UNIQUE ASPECTS OF THE CASE: The head was significantly impaled on the shaft.  I did perform an osteotomy of the lesser and greater tuberosities.  I had excellent repair of the soft tissues at the completion of the case.  PREOPERATIVE INDICATIONS:  [name] is a  70 y.o. female with a diagnosis of left proximal humerus fracture who failed conservative measures and elected for surgical management.    The risks benefits and alternatives were discussed with the patient preoperatively including but not limited to the risks of infection, bleeding, nerve injury, cardiopulmonary complications, the need for revision surgery, dislocation, brachial plexus palsy, incomplete relief of pain, among others, and the patient was willing to proceed.  OPERATIVE IMPLANTS: Biomet size 10 mini humeral stem press-fit with a 40 + 0 mm offset reverse shoulder arthroplasty tray with a 36 mm Vivacit-E standardy polyethylene liner and a 36 mm glenosphere set on B placed with inferior offset, with a mini baseplate and 4 locking screws and one central nonlocking screw.  OPERATIVE FINDINGS: Displaced proximal humerus fracture.  OPERATIVE PROCEDURE: The patient was brought to the operating room and placed in the supine position. General anesthesia was administered. IV antibiotics were given.  Time out was performed. The upper extremity was prepped and draped in usual sterile  fashion. The patient was in a beachchair position. Deltopectoral approach was carried out. The biceps was tenodesed to the pectoralis tendon with #2 max braid.   I used an osteotome to perform a lesser tuberosity osteotomy as well as a greater tuberosity osteotomy and then remove the remaining humeral head.  I turned my attention to the glenoid.  Deep retractors were placed, and I resected the labrum, and then placed a guidepin into the center position on the glenoid, with slight inferior inclination. I then reamed over the guidepin, and this created a small metaphyseal cancellus blush inferiorly, removing just the cartilage to the subchondral bone superiorly. The base plate was selected and impacted place, and then I secured it centrally with a nonlocking screw, and I had excellent purchase both inferiorly and superiorly. I placed a short locking screws on anterior and posterior aspects.  I then turned my attention to the glenosphere, and impacted this into place, placing slight inferior offset (set on B).   The glenosphere was completely seated, and had engagement of the [facility] taper. I then turned my attention back to the humerus.  I sequentially broached, and then trialed, and was found to restore soft tissue tension, and it had 2 finger tightness. Therefore the above named components were selected. The shoulder felt stable throughout functional motion.  I had good rotational stability, the Sat fully down, but the tendon restored some degree of length.  Before I placed the real prosthesis I had also placed a total of 2 #5 max braid e through drill holes in the humerus for around the world repair.  I then impacted the real prosthesis into place, as well as the real humeral tray, and reduced the  shoulder. The shoulder had appropriate motion, and was stable, and I irrigated the wounds copiously.  I used a total of 2 #5 max braid through the supraspinatus, and another 2 through the subscapularis, and  then repaired that interval with a total of 4 crossing strands, and then used the additional 2 around the world sutures to bring the tissue down to the bone.  I also placed bone graft deep to the rotator cuff.  I then irrigated the shoulder copiously once more, repaired the deltopectoral interval with Vicryl followed by subcutaneous Vicryl with Steri-Strips and sterile gauze for the skin. The patient was awakened and returned back in stable and satisfactory condition. There were no complications and She tolerated the procedure well.

## 2021-02-13 NOTE — H&P (Addendum)
PREOPERATIVE H&P  Chief Complaint: Left shoulder pain  HPI: [name] is a 70 y.o. female who presents for preoperative history and physical with a diagnosis of L humerus fracture. She fell on 02/01/21 when going to her PCP's office. She is on Plavix. She has a history of a rotator cuff repair on her left shoulder. She rates her pain to be moderate to severe, and have been worsening.  This is significantly impairing activities of daily living.  She has elected for surgical management.   Past Medical History:  Diagnosis Date   Allergy    Anxiety    Arthritis    back & knee   Asthma     mild per pt shows up with resp illness   Colon polyps    Constipation    Diabetes mellitus without complication (HCC)    Gallstones    Gastric polyps    Gastroparesis    GERD (gastroesophageal reflux disease)    Headache    sinus headaches and migraines at times   Heart murmur    History of migraine headaches    HTN (hypertension)    Hyperlipidemia    IBS (irritable bowel syndrome)    Joint pain    Lumbar disc disease    PONV (postoperative nausea and vomiting)    S/P aortic valve replacement with bioprosthetic valve 08/23/2016   25 mm Edwards Intuity rapid-deployment bovine pericardial tissue valve via partial upper mini sternotomy   Sleep apnea    CPAP   TIA (transient ischemic attack)    TIA (transient ischemic attack)    hx of per pt   Past Surgical History:  Procedure Laterality Date   ABDOMINAL HYSTERECTOMY     AORTIC VALVE REPLACEMENT N/A 08/23/2016   Procedure: AORTIC VALVE REPLACEMENT (AVR) - using partial Upper Sternotomy- 60m Edwards Intuity Aortic Valve used;  Surgeon: CRexene Alberts MD;  Location[facility]  Service: Open Heart Surgery;  Laterality: N/A;   BLADDER SUSPENSION  2007   BREAST ENHANCEMENT SURGERY  1980   CARPAL TUNNEL RELEASE Bilateral    COLONOSCOPY     DORSAL COMPARTMENT RELEASE Right 09/15/2014   Procedure: RIGHT WRIST DEQUERVAINS RELEASE ;  Surgeon:  DKathryne Hitch MD;  Location[facility]  Service: Orthopedics;  Laterality: Right;   ESOPHAGOGASTRODUODENOSCOPY     EYE SURGERY     cataract surgery   KNEE ARTHROSCOPY  04/15/2012   Procedure: ARTHROSCOPY KNEE;  Surgeon: DNinetta Lights MD;  Location: [facility]  Service: Orthopedics;  Laterality: Right;   LAPAROSCOPIC CHOLECYSTECTOMY     LASIK     OOPHORECTOMY     PALATE TO GINGIVA GRAFT  2017   RIGHT/LEFT HEART CATH AND CORONARY ANGIOGRAPHY N/A 08/02/2016   Procedure: Right/Left Heart Cath and Coronary Angiography;  Surgeon: MSherren Mocha MD;  [facility];  Service: Cardiovascular;  Laterality: N/A;   ROTATOR CUFF REPAIR Left    TEE WITHOUT CARDIOVERSION N/A 08/23/2016   Procedure: TRANSESOPHAGEAL ECHOCARDIOGRAM (TEE);  Surgeon: CRexene Alberts MD;  [facility]  Service: Open Heart Surgery;  Laterality: N/A;   TOTAL KNEE ARTHROPLASTY  04/15/2012   Procedure: TOTAL KNEE ARTHROPLASTY;  Surgeon: DNinetta Lights MD;  Location[facility]  Service: Orthopedics;  Laterality: Right;   TRIGGER FINGER RELEASE Right 04/20/2015   Procedure: RIGHT TRIGGER FINGER RELEASE (TENDON SHEALTH INCISION) ;  Surgeon: DNinetta Lights MD;  [facility]  Service: Orthopedics;  Laterality: Right;  Social History   Socioeconomic History   Marital status: Married    Spouse name: [name]   Number of children: 1   Years of education: Not on file   Highest education level: Not on file  Occupational History   Occupation: Retired, Curator: RETIRED  Tobacco Use   Smoking status: Never   Smokeless tobacco: Never  Vaping Use   Vaping Use: Never used  Substance and Sexual Activity   Alcohol use: No   Drug use: No   Sexual activity: Yes  Other Topics Concern   Not on file  Social History Narrative   She lives with husband (1978) and two dogs. Step-son [name] (1971-psychiatrist in [place]). No grandkids. 2 dogs-sheltie/collie  mix and border collie mix      Highest level of education:  Master in education   She is retired Animal nutritionist x 32 years.      Hobbies: time with dogs   Right handed   One story home         Social Determinants of Health   Financial Resource Strain: Low Risk    Difficulty of Paying Living Expenses: Not hard at all  Food Insecurity: No Food Insecurity   Worried About Charity fundraiser in the Last Year: Never true   Arboriculturist in the Last Year: Never true  Transportation Needs: No Transportation Needs   Lack of Transportation (Medical): No   Lack of Transportation (Non-Medical): No  Physical Activity: Insufficiently Active   Days of Exercise per Week: 3 days   Minutes of Exercise per Session: 20 min  Stress: No Stress Concern Present   Feeling of Stress : Not at all  Social Connections: Moderately Isolated   Frequency of Communication with Friends and Family: More than three times a week   Frequency of Social Gatherings with Friends and Family: Once a week   Attends Religious Services: Never   Marine scientist or Organizations: No   Attends Music therapist: Never   Marital Status: Married   Family History  Problem Relation Age of Onset   COPD Mother    Colon polyps Mother    Irritable bowel syndrome Mother    Anxiety disorder Mother    Heart disease Father    Alcohol abuse Father    Breast cancer Maternal Grandmother    Gallbladder disease Maternal Grandmother    Colon polyps Maternal Aunt    Diabetes Paternal Uncle        several uncles   Other Neg Hx        hyponatremia   Colon cancer Neg Hx    Esophageal cancer Neg Hx    Rectal cancer Neg Hx    Stomach cancer Neg Hx    Allergies  Allergen Reactions   Augmentin [Amoxicillin-Pot Clavulanate] Rash   Erythromycin Nausea Only   Penicillins Itching and Rash    Has patient had a PCN reaction causing immediate rash, facial/tongue/throat swelling, SOB or lightheadedness with  hypotension: No Has patient had a PCN reaction causing severe rash involving mucus membranes or skin necrosis: No Has patient had a PCN reaction that required hospitalization: No Has patient had a PCN reaction occurring within the last 10 years: No  If all of the above answers are "NO", then may proceed with Cephalosporin use.    Prior to Admission medications   Medication Sig Start Date End Date Taking? Authorizing Provider  amLODipine (NORVASC) 10  MG tablet Take 10 mg by mouth daily. Pt takes in the pm per pt 10/02/20  Yes Pemberton, Greer Ee, MD  atorvastatin (LIPITOR) 40 MG tablet TAKE 1 TABLET (40 MG TOTAL) BY MOUTH DAILY AT 6 PM. 08/07/20  Yes [name], MD  Biotin 5000 MCG TABS Take 5,000 mcg by mouth daily.    Yes [provider]  calcium-vitamin D (OSCAL WITH D) 500-200 MG-UNIT per tablet Take 2 tablets by mouth daily.    Yes [provider]  Cholecalciferol (VITAMIN D) 125 MCG (5000 UT) CAPS Take 5,000 Units by mouth in the morning.   Yes [provider]  cloNIDine (CATAPRES) 0.1 MG tablet Take 1 tablet (0.1 mg total) by mouth at bedtime. 05/23/20  Yes Dorothy Spark, MD  clopidogrel (PLAVIX) 75 MG tablet TAKE 1 TABLET BY MOUTH EVERY DAY 08/07/20  Yes [name], MD  demeclocycline (DECLOMYCIN) 300 MG tablet TAKE 1 TABLET (300 MG TOTAL) BY MOUTH 2 (TWO) TIMES DAILY. 09/06/20  Yes Renato Shin, MD  fluticasone [facility]) 50 MCG/ACT nasal spray Place 2 sprays into both nostrils in the morning. 04/24/14  Yes [provider]  ibuprofen (ADVIL) 600 MG tablet Take 1 tablet (600 mg total) by mouth every 6 (six) hours as needed for up to 30 doses. 02/01/21  Yes Trifan, Carola Rhine, MD  ipratropium (ATROVENT) 0.03 % nasal spray 2 SPRAYS IN EACH NOSTRIL AS NEEDED THREE TIMES AS NEEDED 03/29/19  Yes [provider]  loratadine (CLARITIN) 10 MG tablet Take 10 mg by mouth at bedtime.    Yes [provider]  losartan (COZAAR) 100 MG  tablet Take 100 mg by mouth daily. Pt takes in the pm per pt   Yes [provider]  Melatonin 5 MG TABS Take 5 mg by mouth at bedtime as needed (sleep).   Yes [provider]  metFORMIN (GLUCOPHAGE) 500 MG tablet Take 500 mg by mouth daily with breakfast.   Yes [provider]  metoCLOPramide (REGLAN) 10 MG tablet TAKE 1/2 TABLET BY MOUTH 4 TIMES A DAY 01/30/21  Yes [name], MD  Multiple Vitamin (MULTIVITAMIN WITH MINERALS) TABS tablet Take 1 tablet by mouth in the morning.   Yes [provider]  omeprazole (PRILOSEC) 40 MG capsule Take 40 mg by mouth daily.   Yes [provider]  ondansetron (ZOFRAN ODT) 4 MG disintegrating tablet Take 1 tablet (4 mg total) by mouth every 8 (eight) hours as needed. 11/18/19  Yes Isla Pence, MD  oxyCODONE (ROXICODONE) 5 MG immediate release tablet Take 1 tablet (5 mg total) by mouth every 6 (six) hours as needed for up to 20 days for severe pain. 02/01/21 02/21/21 Yes Trifan, Carola Rhine, MD  Probiotic Product (ALIGN) 4 MG CAPS Take 4 mg by mouth daily.    Yes [provider]  psyllium (METAMUCIL SMOOTH TEXTURE) 28 % packet Take 1 packet by mouth 2 (two) times daily.   Yes [provider]  senna-docusate (SENOKOT-S) 8.6-50 MG tablet Take 1 tablet by mouth daily as needed for mild constipation. Patient taking differently: Take 1 tablet by mouth 2 (two) times daily. 02/01/21 03/03/21 Yes Trifan, Carola Rhine, MD  sodium chloride 1 g tablet Take 3 g by mouth 2 (two) times daily with a meal.   Yes [provider]  ACCU-CHEK GUIDE test strip USE TO TEST BLOOD SUGARS DAILY. DX: E11.9 10/04/20   [name], MD  Accu-Chek Softclix Lancets lancets USE AS  INSTRUCTED 10/04/20   [name], MD  acetaminophen (TYLENOL) 325 MG tablet Take 2 tablets (650 mg total) by mouth every 6 (six) hours as needed for up to 30 doses. Patient not taking: Reported on 02/06/2021 02/01/21   Wyvonnia Dusky, MD   blood glucose meter kit and supplies KIT Dispense based on patient and insurance preference. Use up to four times daily as directed. Dx: E11.9 12/10/19   [name], MD  docusate sodium (COLACE) 100 MG capsule Take 1 capsule (100 mg total) by mouth 2 (two) times daily. Patient not taking: No sig reported 11/09/20   [name], PA-C  irbesartan (AVAPRO) 300 MG tablet Take one tablet by mouth daily. STOP LOSARTAN Patient not taking: Reported on 02/06/2021 11/09/20   Freada Bergeron, MD  naproxen sodium (ALEVE) 220 MG tablet Take 220 mg by mouth. Patient not taking: Reported on 02/06/2021    [provider]  venlafaxine XR (EFFEXOR-XR) 75 MG 24 hr capsule TAKE 1 CAPSULE BY MOUTH DAILY WITH BREAKFAST. 02/08/21   [name], MD     Positive ROS: All other systems have been reviewed and were otherwise negative with the exception of those mentioned in the HPI and as above.  Physical Exam: General: Alert, no acute distress Cardiovascular: No pedal edema Respiratory: No cyanosis, no use of accessory musculature GI: No organomegaly, abdomen is soft and non-tender Skin: No lesions in the area of chief complaint Neurologic: Sensation intact distally Psychiatric: Patient is competent for consent with normal mood and affect Lymphatic: No axillary or cervical lymphadenopathy  MUSCULOSKELETAL: Pain with any AROM of left shoulder. Full ROM left elbow, wrist, and all fingers of left hand. Distal sensation intact. Capillary refill intact.   CT left shoulder 02/01/21 - Impacted and angulated proximal humerus fracture of the surgical neck, extending through the greater tuberosity. Small joint effusion. No articular surface splitting. Mild anterior inferior glenohumeral subluxation.    Assessment: Left proximal humerus fracture with history of previous rotator cuff repair   Plan: Plan for Procedure(s): REVERSE SHOULDER ARTHROPLASTY  The risks benefits and alternatives  were discussed with the patient including but not limited to the risks of nonoperative treatment, versus surgical intervention including infection, bleeding, nerve injury,  blood clots, cardiopulmonary complications, morbidity, mortality, among others, and they were willing to proceed.   Ventura Bruns, PA-C    02/13/2021 7:36 AM

## 2021-02-13 NOTE — Transfer of Care (Signed)
Immediate Anesthesia Transfer of Care Note  Patient: [name]  Procedure(s) Performed: REVERSE SHOULDER ARTHROPLASTY (Left: Shoulder)  Patient Location: PACU  Anesthesia Type:GA combined with regional for post-op pain  Level of Consciousness: awake, alert , oriented and patient cooperative  Airway & Oxygen Therapy: Patient Spontanous Breathing and Patient connected to face mask oxygen  Post-op Assessment: Report given to RN and Post -op Vital signs reviewed and stable  Post vital signs: Reviewed and stable  Last Vitals:  Vitals Value Taken Time  BP 121/49 02/13/21 1337  Temp    Pulse 83 02/13/21 1340  Resp 18 02/13/21 1340  SpO2 98 % 02/13/21 1340  Vitals shown include unvalidated device data.  Last Pain:  Vitals:   02/13/21 0843  TempSrc:   PainSc: 6          Complications: No notable events documented.

## 2021-02-13 NOTE — Anesthesia Procedure Notes (Signed)
Anesthesia Regional Block: Interscalene brachial plexus block   Pre-Anesthetic Checklist: , timeout performed,  Correct Patient, Correct Site, Correct Laterality,  Correct Procedure, Correct Position, site marked,  Risks and benefits discussed,  Surgical consent,  Pre-op evaluation,  At surgeon's request and post-op pain management  Laterality: Left  Prep: chloraprep       Needles:  Injection technique: Single-shot  Needle Type: Echogenic Needle     Needle Length: 5cm  Needle Gauge: 21     Additional Needles:   Narrative:  Start time: 02/13/2021 9:49 AM End time: 02/13/2021 9:52 AM Injection made incrementally with aspirations every 5 mL.  Performed by: Personally  Anesthesiologist: Audry Pili, MD  Additional Notes: No pain on injection. No increased resistance to injection. Injection made in 5cc increments. Good needle visualization. Patient tolerated the procedure well.

## 2021-02-13 NOTE — Progress Notes (Signed)
Assisted Dr.Thomas Fransisco Beau with Left Shoulder block. Side rails up, monitors on throughout procedure. See vital signs in flow sheet. Tolerated Procedure well.

## 2021-02-13 NOTE — Anesthesia Procedure Notes (Signed)
Procedure Name: Intubation Date/Time: 02/13/2021 10:47 AM Performed by: Cleda Daub, CRNA Pre-anesthesia Checklist: Patient identified, Emergency Drugs available, Suction available and Patient being monitored Patient Re-evaluated:Patient Re-evaluated prior to induction Oxygen Delivery Method: Circle system utilized Preoxygenation: Pre-oxygenation with 100% oxygen Induction Type: IV induction Ventilation: Mask ventilation without difficulty Laryngoscope Size: Mac and 3 Grade View: Grade I Tube type: Oral Tube size: 7.0 mm Number of attempts: 1 Airway Equipment and Method: Stylet and Oral airway Placement Confirmation: ETT inserted through vocal cords under direct vision, positive ETCO2 and breath sounds checked- equal and bilateral Secured at: 21 cm Tube secured with: Tape Dental Injury: Teeth and Oropharynx as per pre-operative assessment

## 2021-02-13 NOTE — Interval H&P Note (Signed)
History and Physical Interval Note:  02/13/2021 9:50 AM  Ceylin P Tufte  has presented today for surgery, with the diagnosis of left proximal humerus fracture.  The various methods of treatment have been discussed with the patient and family. After consideration of risks, benefits and other options for treatment, the patient has consented to  Procedure(s): REVERSE SHOULDER ARTHROPLASTY (Left) as a surgical intervention.  The patient's history has been reviewed, patient examined, no change in status, stable for surgery.  I have reviewed the patient's chart and labs.  Questions were answered to the patient's satisfaction.     Johnny Bridge

## 2021-02-13 NOTE — Discharge Instructions (Signed)
Plavix: ok to restart Plavix 24 hours after surgery  Diet: As you were doing prior to hospitalization   Shower:  May shower but keep the wounds dry, use an occlusive plastic wrap, NO SOAKING IN TUB.  If the bandage gets wet, change with a clean dry gauze.  If you have a splint on, leave the splint in place and keep the splint dry with a plastic bag.  Dressing:  You may change your dressing 3-5 days after surgery, unless you have a splint.  If you have a splint, then just leave the splint in place and we will change your bandages during your first follow-up appointment.    If you had hand or foot surgery, we will plan to remove your stitches in about 2 weeks in the office.  For all other surgeries, there are sticky tapes (steri-strips) on your wounds and all the stitches are absorbable.  Leave the steri-strips in place when changing your dressings, they will peel off with time, usually 2-3 weeks.  Activity:  Increase activity slowly as tolerated, but follow the weight bearing instructions below.  The rules on driving is that you can not be taking narcotics while you drive, and you must feel in control of the vehicle.    Weight Bearing:   Non weight bearing with left arm, keep in sling.   To prevent constipation: you may use a stool softener such as -  Colace (over the counter) 100 mg by mouth twice a day  Drink plenty of fluids (prune juice may be helpful) and high fiber foods Miralax (over the counter) for constipation as needed.    Itching:  If you experience itching with your medications, try taking only a single pain pill, or even half a pain pill at a time.  You may take up to 10 pain pills per day, and you can also use benadryl over the counter for itching or also to help with sleep.   Precautions:  If you experience chest pain or shortness of breath - call 911 immediately for transfer to the hospital emergency department!!  If you develop a fever greater that 101 F, purulent drainage  from wound, increased redness or drainage from wound, or calf pain -- Call the office at [phone]                                                Follow- Up Appointment:  Please call for an appointment to be seen in 2 weeks [place] - [phone]

## 2021-02-13 NOTE — Evaluation (Signed)
Occupational Therapy Evaluation Patient Details Name: [name] MRN: [id number] DOB: 1951/04/29 Today's Date: 02/13/2021    History of Present Illness patient is a 70 year old female who underwent a left total reverse shoulder arthroplasty. [id number], ashmta, DM type 2, HTN, TIA   Clinical Impression   Patient is 70 year old female who s/p shoulder replacement without functional use of left nondominant upper extremity secondary to effects of surgery and interscalene block and shoulder precautions. Therapist provided education and instruction to patient and spouse in regards to exercises, precautions, positioning, donning upper extremity clothing and bathing while maintaining shoulder precautions, ice and edema management and donning/doffing sling. Patient and spouse verbalized understanding and demonstrated as needed. Patient needed assistance to donn shirt, underwear, pants, socks and shoes and provided with instruction on compensatory strategies to perform ADLs. Patient to follow up with MD for further therapy needs.      Follow Up Recommendations  Follow surgeon's recommendation for DC plan and follow-up therapies    Equipment Recommendations  None recommended by OT    Recommendations for Other Services       Precautions / Restrictions Precautions Precautions: Shoulder Type of Shoulder Precautions: no ROM of shoulder. AROM of elbow, wrist and hand ok Shoulder Interventions: Shoulder sling/immobilizer;At all times;Off for dressing/bathing/exercises Precaution Booklet Issued: Yes (comment) (hand out) Restrictions Weight Bearing Restrictions: Yes LUE Weight Bearing: Non weight bearing      Mobility Bed Mobility                    Transfers                      Balance                                           ADL either performed or assessed with clinical judgement   ADL Overall ADL's : At baseline                                        General ADL Comments: patient has had sling in place for over 2 weeks at this point per spouse report. patient was max A for UB dressing and mod a for LB dressing. spouse reported that he has had to assist with all these tasks for past two weeks.     Vision Patient Visual Report: No change from baseline       Perception     Praxis      Pertinent Vitals/Pain Pain Assessment: No/denies pain (nerve block in place)     Hand Dominance Right   Extremity/Trunk Assessment Upper Extremity Assessment Upper Extremity Assessment: LUE deficits/detail LUE Deficits / Details: total reverse shoulder surgery. nerve block in place LUE: Unable to fully assess due to immobilization       Cervical / Trunk Assessment Cervical / Trunk Assessment: Normal   Communication Communication Communication: No difficulties   Cognition Arousal/Alertness: Awake/alert Behavior During Therapy: WFL for tasks assessed/performed Overall Cognitive Status: Within Functional Limits for tasks assessed                                     General Comments  Exercises     Shoulder Instructions Shoulder Instructions Donning/doffing shirt without moving shoulder: Caregiver independent with task Method for sponge bathing under operated UE: Caregiver independent with task Donning/doffing sling/immobilizer: Caregiver independent with task Correct positioning of sling/immobilizer: Caregiver independent with task ROM for elbow, wrist and digits of operated UE: Caregiver independent with task Sling wearing schedule (on at all times/off for ADL's): Caregiver independent with task Proper positioning of operated UE when showering: Caregiver independent with task Positioning of UE while sleeping: Caregiver independent with task    Home Living Family/patient expects to be discharged to:: Private residence Living Arrangements: Spouse/significant other Available Help at  Discharge: Family;Available 24 hours/day                                    Prior Functioning/Environment Level of Independence: Independent                 OT Problem List: Decreased strength;Decreased knowledge of precautions;Decreased knowledge of use of DME or AE      OT Treatment/Interventions:      OT Goals(Current goals can be found in the care plan section) Acute Rehab OT Goals OT Goal Formulation: All assessment and education complete, DC therapy  OT Frequency:     Barriers to D/C:            Co-evaluation              AM-PAC OT "6 Clicks" Daily Activity     Outcome Measure Help from another person eating meals?: A Little Help from another person taking care of personal grooming?: A Little Help from another person toileting, which includes using toliet, bedpan, or urinal?: A Lot Help from another person bathing (including washing, rinsing, drying)?: A Lot Help from another person to put on and taking off regular upper body clothing?: A Lot Help from another person to put on and taking off regular lower body clothing?: A Lot 6 Click Score: 14   End of Session Nurse Communication: Mobility status  Activity Tolerance: Patient tolerated treatment well Patient left: in chair;with family/visitor present                   Time: [id number] OT Time Calculation (min): 25 min Charges:  OT General Charges $OT Visit: 1 Visit OT Evaluation $OT Eval Low Complexity: 1 Low OT Treatments $Self Care/Home Management : 8-22 mins  Jackelyn Poling OTR/L, MS Acute Rehabilitation Department Office# [phone] Pager# [place] 02/13/2021, 3:15 PM

## 2021-02-13 NOTE — Anesthesia Postprocedure Evaluation (Signed)
Anesthesia Post Note  Patient: [name]  Procedure(s) Performed: REVERSE SHOULDER ARTHROPLASTY (Left: Shoulder)     Patient location during evaluation: PACU Anesthesia Type: General Level of consciousness: awake and alert Pain management: pain level controlled Vital Signs Assessment: post-procedure vital signs reviewed and stable Respiratory status: spontaneous breathing, nonlabored ventilation and respiratory function stable Cardiovascular status: stable and blood pressure returned to baseline Anesthetic complications: no   No notable events documented.  Last Vitals:  Vitals:   02/13/21 1430 02/13/21 1443  BP: (!) 124/59 (!) 130/57  Pulse: 91 (!) 102  Resp: 19 16  Temp: 36.7 C   SpO2: 93% 92%    Last Pain:  Vitals:   02/13/21 1443  TempSrc:   PainSc: 0-No pain                 Audry Pili

## 2021-02-14 ENCOUNTER — Ambulatory Visit: Payer: Medicare PPO | Admitting: Endocrinology

## 2021-02-15 ENCOUNTER — Encounter (HOSPITAL_COMMUNITY): Payer: Self-pay | Admitting: Orthopedic Surgery

## 2021-02-21 ENCOUNTER — Telehealth: Payer: Self-pay | Admitting: Cardiology

## 2021-02-21 DIAGNOSIS — Z953 Presence of xenogenic heart valve: Secondary | ICD-10-CM

## 2021-02-21 DIAGNOSIS — I351 Nonrheumatic aortic (valve) insufficiency: Secondary | ICD-10-CM

## 2021-02-21 MED ORDER — CLONIDINE HCL 0.1 MG PO TABS: 0.1000 mg | ORAL_TABLET | Freq: Every day | ORAL | 0 refills | Status: DC

## 2021-02-21 NOTE — Telephone Encounter (Signed)
Pt's medication was sent to pt's pharmacy as requested. Confirmation received.  °

## 2021-02-21 NOTE — Telephone Encounter (Signed)
*  STAT* If patient is at the pharmacy, call can be transferred to refill team.   1. Which medications need to be refilled? (please list name of each medication and dose if known)  cloNIDine (CATAPRES) 0.1 MG tablet  2. Which pharmacy/location (including street and city if local pharmacy) is medication to be sent to? cloNIDine (CATAPRES) 0.1 MG tablet  3. Do they need a 30 day or 90 day supply? 90 day supply

## 2021-02-23 DIAGNOSIS — S42202D Unspecified fracture of upper end of left humerus, subsequent encounter for fracture with routine healing: Secondary | ICD-10-CM | POA: Diagnosis not present

## 2021-03-15 ENCOUNTER — Other Ambulatory Visit: Payer: Self-pay

## 2021-03-15 DIAGNOSIS — I152 Hypertension secondary to endocrine disorders: Secondary | ICD-10-CM

## 2021-03-15 DIAGNOSIS — I10 Essential (primary) hypertension: Secondary | ICD-10-CM

## 2021-03-15 DIAGNOSIS — E1159 Type 2 diabetes mellitus with other circulatory complications: Secondary | ICD-10-CM

## 2021-03-15 DIAGNOSIS — Z79899 Other long term (current) drug therapy: Secondary | ICD-10-CM

## 2021-03-15 DIAGNOSIS — Z953 Presence of xenogenic heart valve: Secondary | ICD-10-CM

## 2021-03-15 MED ORDER — AMLODIPINE BESYLATE 10 MG PO TABS: 10.0000 mg | ORAL_TABLET | Freq: Every day | ORAL | 0 refills | Status: DC

## 2021-03-20 ENCOUNTER — Ambulatory Visit: Payer: Medicare PPO | Admitting: Physician Assistant

## 2021-03-27 DIAGNOSIS — J3089 Other allergic rhinitis: Secondary | ICD-10-CM | POA: Diagnosis not present

## 2021-03-27 DIAGNOSIS — J300 Vasomotor rhinitis: Secondary | ICD-10-CM | POA: Diagnosis not present

## 2021-03-27 DIAGNOSIS — Z23 Encounter for immunization: Secondary | ICD-10-CM | POA: Diagnosis not present

## 2021-03-27 DIAGNOSIS — K219 Gastro-esophageal reflux disease without esophagitis: Secondary | ICD-10-CM | POA: Diagnosis not present

## 2021-04-01 ENCOUNTER — Encounter: Payer: Self-pay | Admitting: Family Medicine

## 2021-04-02 ENCOUNTER — Ambulatory Visit: Payer: Medicare PPO | Attending: Orthopedic Surgery | Admitting: Physical Therapy

## 2021-04-02 ENCOUNTER — Encounter: Payer: Self-pay | Admitting: Physical Therapy

## 2021-04-02 ENCOUNTER — Other Ambulatory Visit: Payer: Self-pay

## 2021-04-02 DIAGNOSIS — Z96612 Presence of left artificial shoulder joint: Secondary | ICD-10-CM | POA: Diagnosis not present

## 2021-04-02 DIAGNOSIS — M25512 Pain in left shoulder: Secondary | ICD-10-CM

## 2021-04-02 DIAGNOSIS — R600 Localized edema: Secondary | ICD-10-CM

## 2021-04-02 DIAGNOSIS — M25612 Stiffness of left shoulder, not elsewhere classified: Secondary | ICD-10-CM

## 2021-04-02 NOTE — Patient Instructions (Signed)
Access Code: [email or web address] URL: [email or web address] Date: 04/02/2021 Prepared by: Lum Babe  Exercises Seated Shoulder External Rotation AAROM with Cane and Hand in Neutral - 3 x daily - 7 x weekly - 3 sets - 10 reps - 3 hold Seated Shoulder Flexion Towel Slide at Table Top - 3 x daily - 7 x weekly - 3 sets - 10 reps - 10 hold Seated Shoulder Shrug Circles AROM Backward - 3 x daily - 7 x weekly - 3 sets - 10 reps - 3 hold Seated Scapular Retraction - 3 x daily - 7 x weekly - 3 sets - 10 reps - 3 hold

## 2021-04-02 NOTE — Therapy (Signed)
[facility] [place], [place], [address] Phone: [phone]   Fax:  [phone]  Physical Therapy Evaluation  Patient Details  Name: [name] MRN: [id number] Date of Birth: [birth year] Referring Provider (PT): Mardelle Matte   Encounter Date: 04/02/2021   PT End of Session - 04/02/21 1517     Visit Number 1    Date for PT Re-Evaluation 07/03/21    Authorization Type [employer]    PT Start Time 6599    PT Stop Time 3570    PT Time Calculation (min) 55 min    Activity Tolerance Patient tolerated treatment well;Patient limited by pain    Behavior During Therapy WFL for tasks assessed/performed             Past Medical History:  Diagnosis Date   Allergy    Anxiety    Arthritis    back & knee   Asthma     mild per pt shows up with resp illness   Colon polyps    Constipation    Diabetes mellitus without complication (HCC)    Gallstones    Gastric polyps    Gastroparesis    GERD (gastroesophageal reflux disease)    Headache    sinus headaches and migraines at times   Heart murmur    History of migraine headaches    HTN (hypertension)    Hyperlipidemia    IBS (irritable bowel syndrome)    Joint pain    Lumbar disc disease    PONV (postoperative nausea and vomiting)    S/P aortic valve replacement with bioprosthetic valve 08/23/2016   25 mm Edwards Intuity rapid-deployment bovine pericardial tissue valve via partial upper mini sternotomy   Sleep apnea    CPAP   TIA (transient ischemic attack)    TIA (transient ischemic attack)    hx of per pt    Past Surgical History:  Procedure Laterality Date   ABDOMINAL HYSTERECTOMY     AORTIC VALVE REPLACEMENT N/A 08/23/2016   Procedure: AORTIC VALVE REPLACEMENT (AVR) - using partial Upper Sternotomy- 59mm Edwards Intuity Aortic Valve used;  Surgeon: Rexene Alberts, MD;  Location: [facility];  Service: Open Heart Surgery;  Laterality: N/A;   BLADDER SUSPENSION   2007   BREAST ENHANCEMENT SURGERY  1980   CARPAL TUNNEL RELEASE Bilateral    COLONOSCOPY     DORSAL COMPARTMENT RELEASE Right 09/15/2014   Procedure: RIGHT WRIST DEQUERVAINS RELEASE ;  Surgeon: Kathryne Hitch, MD;  Location: [facility];  Service: Orthopedics;  Laterality: Right;   ESOPHAGOGASTRODUODENOSCOPY     EYE SURGERY     cataract surgery   KNEE ARTHROSCOPY  04/15/2012   Procedure: ARTHROSCOPY KNEE;  Surgeon: [name], MD;  Location: [facility];  Service: Orthopedics;  Laterality: Right;   LAPAROSCOPIC CHOLECYSTECTOMY     LASIK     OOPHORECTOMY     PALATE TO GINGIVA GRAFT  2017   REVERSE SHOULDER ARTHROPLASTY Left 02/13/2021   Procedure: REVERSE SHOULDER ARTHROPLASTY;  Surgeon: Marchia Bond, MD;  Location: [facility];  Service: Orthopedics;  Laterality: Left;   RIGHT/LEFT HEART CATH AND CORONARY ANGIOGRAPHY N/A 08/02/2016   Procedure: Right/Left Heart Cath and Coronary Angiography;  Surgeon: Sherren Mocha, MD;  Location: [facility];  Service: Cardiovascular;  Laterality: N/A;   ROTATOR CUFF REPAIR Left    TEE WITHOUT CARDIOVERSION N/A 08/23/2016   Procedure: TRANSESOPHAGEAL ECHOCARDIOGRAM (TEE);  Surgeon: Rexene Alberts, MD;  Location: [facility];  Service: Open Heart Surgery;  Laterality: N/A;   TOTAL KNEE ARTHROPLASTY  04/15/2012   Procedure: TOTAL KNEE ARTHROPLASTY;  Surgeon: [name], MD;  Location: [facility];  Service: Orthopedics;  Laterality: Right;   TRIGGER FINGER RELEASE Right 04/20/2015   Procedure: RIGHT TRIGGER FINGER RELEASE (TENDON SHEALTH INCISION) ;  Surgeon: [name], MD;  Location: [facility];  Service: Orthopedics;  Laterality: Right;    There were no vitals filed for this visit.    Subjective Assessment - 04/02/21 1447     Subjective Patient underwent a left reverse total shoulder replacement 02/13/21.    She had a fall on August 11 and sustained a proximal humeral fracture.  She was in a sling for the first 6 weeks.  She  reports so far she is doing well but c/o soreness from the shoulder to the elbow.    Limitations Lifting;House hold activities    Patient Stated Goals get back to normal, good ROM, no pain    Currently in Pain? Yes    Pain Score 2     Pain Location Shoulder    Pain Orientation Left    Pain Descriptors / Indicators Sore;Tightness    Pain Radiating Towards denies    Pain Onset More than a month ago    Pain Frequency Constant    Aggravating Factors  pushing the ROM pain up to 6-7/10, doing too much, lifting    Pain Relieving Factors pain meds, ice pain can be 1/10    Effect of Pain on Daily Activities difficulty with all ADL's                [facility] PT Assessment - 04/02/21 0001       Assessment   Medical Diagnosis s/p left revers total shoulder arthroplasty    Referring Provider (PT) Mardelle Matte    Onset Date/Surgical Date 02/13/21    Hand Dominance Right    Prior Therapy no      Precautions   Precautions None      Balance Screen   Has the patient fallen in the past 6 months Yes    How many times? 1    Has the patient had a decrease in activity level because of a fear of falling?  No    Is the patient reluctant to leave their home because of a fear of falling?  No      Home Environment   Additional Comments normal housework,      Prior Function   Level of Independence Independent    Vocation Retired    Leisure had just returned to the gym      Posture/Postural Control   Posture Comments fwd head, rounded shoulders      ROM / Strength   AROM / PROM / Strength AROM;PROM      AROM   Overall AROM Comments 20 degrees lack of left elbow extension, lacking some supinaiton, all AROM and PROM of the left shoulder was signifiacnt  shoulder elevation    AROM Assessment Site Shoulder    Right/Left Shoulder Left    Left Shoulder Flexion 20 Degrees    Left Shoulder ABduction 20 Degrees    Left Shoulder Internal Rotation 30 Degrees    Left Shoulder External Rotation 0 Degrees       PROM   PROM Assessment Site Shoulder    Right/Left Shoulder Left    Left Shoulder Flexion 80 Degrees    Left Shoulder ABduction 60  Degrees    Left Shoulder Internal Rotation 35 Degrees    Left Shoulder External Rotation 0 Degrees      Palpation   Palpation comment very guarded, tender and tight in the pectoral and the upper trap, tender in the left upper arm                        Objective measurements completed on examination: See above findings.       [facility] PT Treatment/Exercise - 04/02/21 0001       Modalities   Modalities Vasopneumatic      Vasopneumatic   Number Minutes Vasopneumatic  10 minutes    Vasopnuematic Location  Shoulder    Vasopneumatic Pressure Low    Vasopneumatic Temperature  37                       PT Short Term Goals - 04/02/21 1530       PT SHORT TERM GOAL #1   Title independent with intial HEP    Time 2    Period Weeks    Status New               PT Long Term Goals - 04/02/21 1531       PT LONG TERM GOAL #1   Title decrease pain 50%    Time 12    Period Weeks    Status New      PT LONG TERM GOAL #2   Title returns safely to the gym    Time 12    Period Weeks    Status New      PT LONG TERM GOAL #3   Title increase AROM of the left shoulder to 120 degrees flexion    Time 12    Period Weeks    Status New      PT LONG TERM GOAL #4   Title report no difficulty dressing or doing hair    Time 12    Period Weeks    Status New                    Plan - 04/02/21 1518     Clinical Impression Statement Patient had a fall and sustained a proximal humerus fracture, she underwent a left total shoulder replacement on 02/13/21.  She reports that she was in a sling for the first 6 weeks.  She is very tight in the elbow and the pectoral, upper trap and upperarm, she is tender here as well.  She has very limited AROM/PROM, any motions with the shoulder has significant compensation  using the upper trap.    Stability/Clinical Decision Making Evolving/Moderate complexity    Clinical Decision Making Low    Rehab Potential Good    PT Frequency 2x / week    PT Duration 12 weeks    PT Treatment/Interventions ADLs/Self Care Home Management;Cryotherapy;Electrical Stimulation;Therapeutic activities;Therapeutic exercise;Neuromuscular re-education;Manual techniques;Patient/family education;Cognitive remediation;Vasopneumatic Device    PT Next Visit Plan slowly start PROM/AAROM    Consulted and Agree with Plan of Care Patient             Patient will benefit from skilled therapeutic intervention in order to improve the following deficits and impairments:  Decreased range of motion, Impaired UE functional use, Increased muscle spasms, Pain, Improper body mechanics, Postural dysfunction, Increased edema, Decreased strength, Decreased mobility  Visit Diagnosis: S/P reverse total shoulder arthroplasty, left - Plan: PT plan of  care cert/re-cert  Acute pain of left shoulder - Plan: PT plan of care cert/re-cert  Stiffness of left shoulder, not elsewhere classified - Plan: PT plan of care cert/re-cert  Localized edema - Plan: PT plan of care cert/re-cert     Problem List Patient Active Problem List   Diagnosis Date Noted   S/P reverse total shoulder arthroplasty, left 02/13/2021   Idiopathic hyperphosphatasia 08/03/2019   Vitamin D deficiency 06/04/2018   Hyponatremia 02/17/2018   History of CVA in adulthood 08/13/2017   Blurry vision 08/13/2017   Hypertension associated with diabetes ([place]) 01/02/2017   S/P minimally invasive aortic valve replacement with bioprosthetic valve 09/47/0962   Diastolic dysfunction    Osteopenia 05/27/2016   OSA (obstructive sleep apnea) 02/27/2016   Class 1 obesity with serious comorbidity and body mass index (BMI) of 32.0 to 32.9 in adult 10/23/2015   Bruxism 10/23/2015   Elevated alkaline phosphatase level 05/16/2015   Atypical chest  pain 11/10/2014   Acne 05/12/2014   Diabetes mellitus ([place]) 05/12/2014   GERD (gastroesophageal reflux disease) 03/10/2014   Generalized anxiety disorder 03/10/2014   Migraines 03/10/2014   EUSTACHIAN TUBE DYSFUNCTION, CHRONIC 06/14/2010   Irritable bowel syndrome 12/15/2008   Low back pain 08/11/2008   Hyperlipidemia associated with type 2 diabetes mellitus ([place]) 02/09/2008   Allergic rhinitis 02/12/2007   Asthma 02/12/2007    Sumner Boast, PT 04/02/2021, 4:14 PM  [facility]. [place], [place], [address] Phone: [phone]   Fax:  [phone]  Name: [name] MRN: [id number] Date of Birth: 03/26/1951

## 2021-04-04 ENCOUNTER — Ambulatory Visit (INDEPENDENT_AMBULATORY_CARE_PROVIDER_SITE_OTHER): Payer: Medicare PPO | Admitting: Physician Assistant

## 2021-04-04 ENCOUNTER — Other Ambulatory Visit: Payer: Self-pay

## 2021-04-04 ENCOUNTER — Encounter (INDEPENDENT_AMBULATORY_CARE_PROVIDER_SITE_OTHER): Payer: Self-pay | Admitting: Physician Assistant

## 2021-04-04 VITALS — HR 85 | Temp 99.3°F | Wt 194.0 lb

## 2021-04-04 DIAGNOSIS — E1159 Type 2 diabetes mellitus with other circulatory complications: Secondary | ICD-10-CM

## 2021-04-04 DIAGNOSIS — I152 Hypertension secondary to endocrine disorders: Secondary | ICD-10-CM

## 2021-04-04 DIAGNOSIS — E119 Type 2 diabetes mellitus without complications: Secondary | ICD-10-CM

## 2021-04-04 DIAGNOSIS — Z6836 Body mass index (BMI) 36.0-36.9, adult: Secondary | ICD-10-CM | POA: Diagnosis not present

## 2021-04-04 NOTE — Progress Notes (Signed)
Chief Complaint:   OBESITY Makara is here to discuss her progress with her obesity treatment plan along with follow-up of her obesity related diagnoses. Brandye is on keeping a food journal and adhering to recommended goals of 1300-1400 calories and 100 grams protein and states she is following her eating plan approximately 90% of the time. Nika states she is doing PT/exercises 20 minutes 7 times per week.  Today's visit was #: 31 Starting weight: 220 lbs Starting date: 01/19/2018 Today's weight: 194 lbs Today's date: 04/04/2021 Total lbs lost to date: 26 Total lbs lost since last in-office visit: 10  Interim History: Theora did very well with weight loss. She recently had a shoulder replacement. She has done a good job with eating on plan, as she eats a lot of the same things daily. She hasn't journaled consistently.  Subjective:   1. Type 2 diabetes mellitus without complication, unspecified whether long term insulin use (HCC) Pt's last A1c was 6.5. She is on Metformin and tolerating it well.  Assessment/Plan:   1. Type 2 diabetes mellitus without complication, unspecified whether long term insulin use (HCC) Good blood sugar control is important to decrease the likelihood of diabetic complications such as nephropathy, neuropathy, limb loss, blindness, coronary artery disease, and death. Intensive lifestyle modification including diet, exercise and weight loss are the first line of treatment for diabetes. Continue meds and weight loss.  2. Obesity with current BMI of 31.4  Duane is currently in the action stage of change. As such, her goal is to continue with weight loss efforts. She has agreed to keeping a food journal and adhering to recommended goals of 1300-1400 calories and 100 grams protein.   Exercise goals:  As is  Behavioral modification strategies: meal planning and cooking strategies and keeping healthy foods in the home.  Ayonna has  agreed to follow-up with our clinic in 2-3 weeks. She was informed of the importance of frequent follow-up visits to maximize her success with intensive lifestyle modifications for her multiple health conditions.   Objective:   Pulse 85, temperature 99.3 F (37.4 C), weight 194 lb (88 kg), SpO2 98 %. Body mass index is 31.31 kg/m.  General: Cooperative, alert, well developed, in no acute distress. HEENT: Conjunctivae and lids unremarkable. Cardiovascular: Regular rhythm.  Lungs: Normal work of breathing. Neurologic: No focal deficits.   Lab Results  Component Value Date   CREATININE 0.52 02/09/2021   BUN 22 02/09/2021   NA 136 02/09/2021   K 4.9 02/09/2021   CL 104 02/09/2021   CO2 26 02/09/2021   Lab Results  Component Value Date   ALT 19 08/15/2020   AST 33 08/15/2020   ALKPHOS 149 (H) 08/15/2020   BILITOT 0.5 08/15/2020   Lab Results  Component Value Date   HGBA1C 6.5 (H) 02/09/2021   HGBA1C 6.3 (H) 01/25/2021   HGBA1C 5.7 (H) 04/20/2020   HGBA1C 5.9 (A) 12/09/2019   HGBA1C 6.1 06/08/2019   Lab Results  Component Value Date   INSULIN 7.7 01/25/2021   INSULIN 13.1 04/20/2020   INSULIN 12.1 01/19/2018   Lab Results  Component Value Date   TSH 3.38 06/04/2018   Lab Results  Component Value Date   CHOL 141 04/20/2020   HDL 65 04/20/2020   LDLCALC 63 04/20/2020   LDLDIRECT 58.0 11/25/2017   TRIG 66 04/20/2020   CHOLHDL 2.2 04/20/2020   Lab Results  Component Value Date   VD25OH 74.5 01/25/2021   VD25OH 66.5 04/20/2020  VD25OH 52.21 06/08/2019   Lab Results  Component Value Date   WBC 11.5 (H) 02/09/2021   HGB 12.9 02/09/2021   HCT 41.1 02/09/2021   MCV 87.1 02/09/2021   PLT 401 (H) 02/09/2021   No results found for: IRON, TIBC, FERRITIN  Obesity Behavioral Intervention:   Approximately 15 minutes were spent on the discussion below.  ASK: We discussed the diagnosis of obesity with [name] today and Quetzaly agreed to give [name] permission  to discuss obesity behavioral modification therapy today.  ASSESS: Karmina has the diagnosis of obesity and her BMI today is 31.4. Schyler is in the action stage of change.   ADVISE: Sibbie was educated on the multiple health risks of obesity as well as the benefit of weight loss to improve her health. She was advised of the need for long term treatment and the importance of lifestyle modifications to improve her current health and to decrease her risk of future health problems.  AGREE: Multiple dietary modification options and treatment options were discussed and Maicey agreed to follow the recommendations documented in the above note.  ARRANGE: Nadezhda was educated on the importance of frequent visits to treat obesity as outlined per CMS and USPSTF guidelines and agreed to schedule her next follow up appointment today.  Attestation Statements:   Reviewed by clinician on day of visit: allergies, medications, problem list, medical history, surgical history, family history, social history, and previous encounter notes.  Coral Ceo, CMA, am acting as transcriptionist for [employer], PA-C.  I have reviewed the above documentation for accuracy and completeness, and I agree with the above. [name], PA-C

## 2021-04-11 ENCOUNTER — Encounter: Payer: Self-pay | Admitting: Physical Therapy

## 2021-04-11 ENCOUNTER — Other Ambulatory Visit: Payer: Self-pay

## 2021-04-11 ENCOUNTER — Ambulatory Visit: Payer: Medicare PPO | Admitting: Physical Therapy

## 2021-04-11 DIAGNOSIS — Z96612 Presence of left artificial shoulder joint: Secondary | ICD-10-CM

## 2021-04-11 DIAGNOSIS — M25512 Pain in left shoulder: Secondary | ICD-10-CM

## 2021-04-11 DIAGNOSIS — M25612 Stiffness of left shoulder, not elsewhere classified: Secondary | ICD-10-CM | POA: Diagnosis not present

## 2021-04-11 DIAGNOSIS — R600 Localized edema: Secondary | ICD-10-CM | POA: Diagnosis not present

## 2021-04-11 NOTE — Therapy (Signed)
[place]. [place], [place], [address] Phone: [phone]   Fax:  [phone]  Physical Therapy Treatment  Patient Details  Name: [name] MRN: [id number] Date of Birth: 10-Dec-1950 Referring Provider (PT): Mardelle Matte   Encounter Date: 04/11/2021   PT End of Session - 04/11/21 1552     Visit Number 2    Date for PT Re-Evaluation 07/03/21    Authorization Type [employer]    PT Start Time 3295    PT Stop Time 1600    PT Time Calculation (min) 45 min    Activity Tolerance Patient tolerated treatment well;Patient limited by pain    Behavior During Therapy [facility] for tasks assessed/performed             Past Medical History:  Diagnosis Date   Allergy    Anxiety    Arthritis    back & knee   Asthma     mild per pt shows up with resp illness   Colon polyps    Constipation    Diabetes mellitus without complication (HCC)    Gallstones    Gastric polyps    Gastroparesis    GERD (gastroesophageal reflux disease)    Headache    sinus headaches and migraines at times   Heart murmur    History of migraine headaches    HTN (hypertension)    Hyperlipidemia    IBS (irritable bowel syndrome)    Joint pain    Lumbar disc disease    PONV (postoperative nausea and vomiting)    S/P aortic valve replacement with bioprosthetic valve 08/23/2016   25 mm Edwards Intuity rapid-deployment bovine pericardial tissue valve via partial upper mini sternotomy   Sleep apnea    CPAP   TIA (transient ischemic attack)    TIA (transient ischemic attack)    hx of per pt    Past Surgical History:  Procedure Laterality Date   ABDOMINAL HYSTERECTOMY     AORTIC VALVE REPLACEMENT N/A 08/23/2016   Procedure: AORTIC VALVE REPLACEMENT (AVR) - using partial Upper Sternotomy- 63m Edwards Intuity Aortic Valve used;  Surgeon: CRexene Alberts MD;  [place] [facility]  Service: Open Heart Surgery;  Laterality: N/A;   BLADDER SUSPENSION   2007   BREAST ENHANCEMENT SURGERY  1980   CARPAL TUNNEL RELEASE Bilateral    COLONOSCOPY     DORSAL COMPARTMENT RELEASE Right 09/15/2014   Procedure: RIGHT WRIST DEQUERVAINS RELEASE ;  Surgeon: DKathryne Hitch MD;  [place] [facility]  Service: Orthopedics;  Laterality: Right;   ESOPHAGOGASTRODUODENOSCOPY     EYE SURGERY     cataract surgery   KNEE ARTHROSCOPY  04/15/2012   Procedure: ARTHROSCOPY KNEE;  Surgeon: DNinetta Lights MD;  [place] [facility]  Service: Orthopedics;  Laterality: Right;   LAPAROSCOPIC CHOLECYSTECTOMY     LASIK     OOPHORECTOMY     PALATE TO GINGIVA GRAFT  2017   REVERSE SHOULDER ARTHROPLASTY Left 02/13/2021   Procedure: REVERSE SHOULDER ARTHROPLASTY;  Surgeon: LMarchia Bond MD;  [facility];  Service: Orthopedics;  Laterality: Left;   RIGHT/LEFT HEART CATH AND CORONARY ANGIOGRAPHY N/A 08/02/2016   Procedure: Right/Left Heart Cath and Coronary Angiography;  Surgeon: MSherren Mocha MD;  [facility];  Service: Cardiovascular;  Laterality: N/A;   ROTATOR CUFF REPAIR Left    TEE WITHOUT CARDIOVERSION N/A 08/23/2016   Procedure: TRANSESOPHAGEAL ECHOCARDIOGRAM (TEE);  Surgeon: CRexene Alberts MD;  [facility];  Service: Open Heart Surgery;  Laterality: N/A;   TOTAL KNEE ARTHROPLASTY  04/15/2012   Procedure: TOTAL KNEE ARTHROPLASTY;  Surgeon: [name], MD;  Location: [place];  Service: Orthopedics;  Laterality: Right;   TRIGGER FINGER RELEASE Right 04/20/2015   Procedure: RIGHT TRIGGER FINGER RELEASE (TENDON SHEALTH INCISION) ;  Surgeon: [name], MD;  Location: [place];  Service: Orthopedics;  Laterality: Right;    There were no vitals filed for this visit.   Subjective Assessment - 04/11/21 1520     Subjective Making baby steps, able to put lotion on he R arm with the L.    Currently in Pain? Yes    Pain Score 4     Pain Location Shoulder    Pain Orientation Left    Pain Descriptors /  Indicators Discomfort                               [facility] Adult PT Treatment/Exercise - 04/11/21 0001       Exercises   Exercises Shoulder      Shoulder Exercises: Standing   Other Standing Exercises ball on table flex CW/CCW x10 each, Biceps curls 2lb 2x10    Other Standing Exercises AAROM Flex, Ext, IR up back x10      Shoulder Exercises: ROM/Strengthening   UBE (Upper Arm Bike) L1 x 2 min    Nustep L3 x 4 min      Modalities   Modalities Vasopneumatic      Vasopneumatic   Number Minutes Vasopneumatic  10 minutes    Vasopnuematic Location  Shoulder    Vasopneumatic Pressure Low    Vasopneumatic Temperature  37      Manual Therapy   Manual Therapy Passive ROM;Manual Traction;Joint mobilization    Joint Mobilization L GH grades 1    Passive ROM Gentel motions of L shoulder avoiding pain    Manual Traction gentel distraction                       PT Short Term Goals - 04/11/21 1555       PT SHORT TERM GOAL #1   Title independent with intial HEP    Status Partially Met               PT Long Term Goals - 04/02/21 1531       PT LONG TERM GOAL #1   Title decrease pain 50%    Time 12    Period Weeks    Status New      PT LONG TERM GOAL #2   Title returns safely to the gym    Time 12    Period Weeks    Status New      PT LONG TERM GOAL #3   Title increase AROM of the left shoulder to 120 degrees flexion    Time 12    Period Weeks    Status New      PT LONG TERM GOAL #4   Title report no difficulty dressing or doing hair    Time 12    Period Weeks    Status New                   Plan - 04/11/21 1552     Clinical Impression Statement Pt tolerated an initial progression to [facility] /PROM interventions well. Pt stated tats he had  a good stretch with ball on table interventions, some L wrist stiffness reported with circles. Some postural compensation noted with AAROM using the cane. Some tightness noted at the  end range of PROM. PROM performed in the pain free range.    Stability/Clinical Decision Making Evolving/Moderate complexity    Rehab Potential Good    PT Frequency 2x / week    PT Duration 12 weeks    PT Treatment/Interventions ADLs/Self Care Home Management;Cryotherapy;Electrical Stimulation;Therapeutic activities;Therapeutic exercise;Neuromuscular re-education;Manual techniques;Patient/family education;Cognitive remediation;Vasopneumatic Device    PT Next Visit Plan slowly start PROM/AAROM             Patient will benefit from skilled therapeutic intervention in order to improve the following deficits and impairments:  Decreased range of motion, Impaired UE functional use, Increased muscle spasms, Pain, Improper body mechanics, Postural dysfunction, Increased edema, Decreased strength, Decreased mobility  Visit Diagnosis: Stiffness of left shoulder, not elsewhere classified  S/P reverse total shoulder arthroplasty, left  Localized edema  Acute pain of left shoulder     Problem List Patient Active Problem List   Diagnosis Date Noted   S/P reverse total shoulder arthroplasty, left 02/13/2021   Idiopathic hyperphosphatasia 08/03/2019   Vitamin D deficiency 06/04/2018   Hyponatremia 02/17/2018   History of CVA in adulthood 08/13/2017   Blurry vision 08/13/2017   Hypertension associated with diabetes ([place]) 01/02/2017   S/P minimally invasive aortic valve replacement with bioprosthetic valve 65/68/1275   Diastolic dysfunction    Osteopenia 05/27/2016   OSA (obstructive sleep apnea) 02/27/2016   Class 1 obesity with serious comorbidity and body mass index (BMI) of 32.0 to 32.9 in adult 10/23/2015   Bruxism 10/23/2015   Elevated alkaline phosphatase level 05/16/2015   Atypical chest pain 11/10/2014   Acne 05/12/2014   Diabetes mellitus ([place]) 05/12/2014   GERD (gastroesophageal reflux disease) 03/10/2014   Generalized anxiety disorder 03/10/2014   Migraines 03/10/2014    EUSTACHIAN TUBE DYSFUNCTION, CHRONIC 06/14/2010   Irritable bowel syndrome 12/15/2008   Low back pain 08/11/2008   Hyperlipidemia associated with type 2 diabetes mellitus ([place]) 02/09/2008   Allergic rhinitis 02/12/2007   Asthma 02/12/2007    Scot Jun, PTA 04/11/2021, 3:56 PM  [place]. [place], [place], [address] Phone: [phone]   Fax:  [phone]  Name: [name] MRN: [id number] Date of Birth: 02-17-1951

## 2021-04-16 ENCOUNTER — Other Ambulatory Visit: Payer: Self-pay

## 2021-04-16 ENCOUNTER — Ambulatory Visit: Payer: Medicare PPO | Admitting: Physical Therapy

## 2021-04-16 ENCOUNTER — Encounter: Payer: Self-pay | Admitting: Physical Therapy

## 2021-04-16 DIAGNOSIS — Z96612 Presence of left artificial shoulder joint: Secondary | ICD-10-CM | POA: Diagnosis not present

## 2021-04-16 DIAGNOSIS — R600 Localized edema: Secondary | ICD-10-CM

## 2021-04-16 DIAGNOSIS — M25512 Pain in left shoulder: Secondary | ICD-10-CM

## 2021-04-16 DIAGNOSIS — M25612 Stiffness of left shoulder, not elsewhere classified: Secondary | ICD-10-CM | POA: Diagnosis not present

## 2021-04-16 NOTE — Therapy (Signed)
[place]. [place], [place], [address] Phone: [phone]   Fax:  [phone]  Physical Therapy Treatment  Patient Details  Name: [name] MRN: [id number] Date of Birth: 12-Feb-1951 Referring Provider (PT): Mardelle Matte   Encounter Date: 04/16/2021   PT End of Session - 04/16/21 1509     Visit Number 3    Date for PT Re-Evaluation 07/03/21    Authorization Type [employer]    PT Start Time 1430    PT Stop Time 6294    PT Time Calculation (min) 45 min    Activity Tolerance Patient tolerated treatment well;Patient limited by pain    Behavior During Therapy [facility] for tasks assessed/performed             Past Medical History:  Diagnosis Date   Allergy    Anxiety    Arthritis    back & knee   Asthma     mild per pt shows up with resp illness   Colon polyps    Constipation    Diabetes mellitus without complication (HCC)    Gallstones    Gastric polyps    Gastroparesis    GERD (gastroesophageal reflux disease)    Headache    sinus headaches and migraines at times   Heart murmur    History of migraine headaches    HTN (hypertension)    Hyperlipidemia    IBS (irritable bowel syndrome)    Joint pain    Lumbar disc disease    PONV (postoperative nausea and vomiting)    S/P aortic valve replacement with bioprosthetic valve 08/23/2016   25 mm Edwards Intuity rapid-deployment bovine pericardial tissue valve via partial upper mini sternotomy   Sleep apnea    CPAP   TIA (transient ischemic attack)    TIA (transient ischemic attack)    hx of per pt    Past Surgical History:  Procedure Laterality Date   ABDOMINAL HYSTERECTOMY     AORTIC VALVE REPLACEMENT N/A 08/23/2016   Procedure: AORTIC VALVE REPLACEMENT (AVR) - using partial Upper Sternotomy- 70mm Edwards Intuity Aortic Valve used;  Surgeon: Rexene Alberts, MD;  Location: [place];  Service: Open Heart Surgery;  Laterality: N/A;   BLADDER SUSPENSION   2007   BREAST ENHANCEMENT SURGERY  1980   CARPAL TUNNEL RELEASE Bilateral    COLONOSCOPY     DORSAL COMPARTMENT RELEASE Right 09/15/2014   Procedure: RIGHT WRIST DEQUERVAINS RELEASE ;  Surgeon: Kathryne Hitch, MD;  Location: [place];  Service: Orthopedics;  Laterality: Right;   ESOPHAGOGASTRODUODENOSCOPY     EYE SURGERY     cataract surgery   KNEE ARTHROSCOPY  04/15/2012   Procedure: ARTHROSCOPY KNEE;  Surgeon: [name], MD;  Location: [place];  Service: Orthopedics;  Laterality: Right;   LAPAROSCOPIC CHOLECYSTECTOMY     LASIK     OOPHORECTOMY     PALATE TO GINGIVA GRAFT  2017   REVERSE SHOULDER ARTHROPLASTY Left 02/13/2021   Procedure: REVERSE SHOULDER ARTHROPLASTY;  Surgeon: Marchia Bond, MD;  [facility];  Service: Orthopedics;  Laterality: Left;   RIGHT/LEFT HEART CATH AND CORONARY ANGIOGRAPHY N/A 08/02/2016   Procedure: Right/Left Heart Cath and Coronary Angiography;  Surgeon: Sherren Mocha, MD;  Location: [place] [facility];  Service: Cardiovascular;  Laterality: N/A;   ROTATOR CUFF REPAIR Left    TEE WITHOUT CARDIOVERSION N/A 08/23/2016   Procedure: TRANSESOPHAGEAL ECHOCARDIOGRAM (TEE);  Surgeon: Rexene Alberts, MD;  Location: [facility];  Service: Open Heart Surgery;  Laterality: N/A;   TOTAL KNEE ARTHROPLASTY  04/15/2012   Procedure: TOTAL KNEE ARTHROPLASTY;  Surgeon: [name], MD;  Location: [place];  Service: Orthopedics;  Laterality: Right;   TRIGGER FINGER RELEASE Right 04/20/2015   Procedure: RIGHT TRIGGER FINGER RELEASE (TENDON SHEALTH INCISION) ;  Surgeon: [name], MD;  Location: [place];  Service: Orthopedics;  Laterality: Right;    There were no vitals filed for this visit.   Subjective Assessment - 04/16/21 1433     Subjective "I feel good"    Currently in Pain? Yes    Pain Score 6     Pain Location Shoulder                               [facility] Adult PT Treatment/Exercise - 04/16/21 0001        Shoulder Exercises: Standing   Other Standing Exercises ball on table CW/CCW x10 each, Biceps curls 2lb 2x10    Other Standing Exercises AAROM Flex, Ext, IR up back x10      Shoulder Exercises: Pulleys   Flexion 2 minutes    Scaption 2 minutes      Shoulder Exercises: ROM/Strengthening   Nustep L3 x 6 min      Modalities   Modalities Vasopneumatic      Vasopneumatic   Number Minutes Vasopneumatic  10 minutes    Vasopnuematic Location  Shoulder    Vasopneumatic Pressure Low    Vasopneumatic Temperature  37      Manual Therapy   Manual Therapy Passive ROM;Manual Traction;Joint mobilization    Joint Mobilization L GH grades 1    Passive ROM Gentel motions of L shoulder avoiding pain    Manual Traction gentel distraction                       PT Short Term Goals - 04/16/21 1510       PT SHORT TERM GOAL #1   Title independent with intial HEP    Status Achieved               PT Long Term Goals - 04/16/21 1510       PT LONG TERM GOAL #1   Title decrease pain 50%    Status On-going      PT LONG TERM GOAL #2   Title returns safely to the gym    Status On-going                   Plan - 04/16/21 1511     Clinical Impression Statement Pt did well with a continuation of AAROM/PROM interventions. Cue to be cautious of elbow positioning needed with pulley interventions. Postural compensation noted with standing AAROM interventions due to limited L shoulder ROM. End range tightness with reports of pain noted with PROM.    Stability/Clinical Decision Making Evolving/Moderate complexity    Rehab Potential Good    PT Frequency 2x / week    PT Duration 12 weeks    PT Treatment/Interventions ADLs/Self Care Home Management;Cryotherapy;Electrical Stimulation;Therapeutic activities;Therapeutic exercise;Neuromuscular re-education;Manual techniques;Patient/family education;Cognitive remediation;Vasopneumatic Device    PT Next Visit Plan slowly start  PROM/AAROM MD note             Patient will benefit from skilled therapeutic intervention in order to improve the following deficits and impairments:  Decreased range of motion, Impaired UE  functional use, Increased muscle spasms, Pain, Improper body mechanics, Postural dysfunction, Increased edema, Decreased strength, Decreased mobility  Visit Diagnosis: Stiffness of left shoulder, not elsewhere classified  Acute pain of left shoulder  S/P reverse total shoulder arthroplasty, left  Localized edema     Problem List Patient Active Problem List   Diagnosis Date Noted   S/P reverse total shoulder arthroplasty, left 02/13/2021   Idiopathic hyperphosphatasia 08/03/2019   Vitamin D deficiency 06/04/2018   Hyponatremia 02/17/2018   History of CVA in adulthood 08/13/2017   Blurry vision 08/13/2017   Hypertension associated with diabetes ([place]) 01/02/2017   S/P minimally invasive aortic valve replacement with bioprosthetic valve 65/99/3570   Diastolic dysfunction    Osteopenia 05/27/2016   OSA (obstructive sleep apnea) 02/27/2016   Class 1 obesity with serious comorbidity and body mass index (BMI) of 32.0 to 32.9 in adult 10/23/2015   Bruxism 10/23/2015   Elevated alkaline phosphatase level 05/16/2015   Atypical chest pain 11/10/2014   Acne 05/12/2014   Diabetes mellitus ([place]) 05/12/2014   GERD (gastroesophageal reflux disease) 03/10/2014   Generalized anxiety disorder 03/10/2014   Migraines 03/10/2014   EUSTACHIAN TUBE DYSFUNCTION, CHRONIC 06/14/2010   Irritable bowel syndrome 12/15/2008   Low back pain 08/11/2008   Hyperlipidemia associated with type 2 diabetes mellitus ([place]) 02/09/2008   Allergic rhinitis 02/12/2007   Asthma 02/12/2007    Scot Jun, PTA 04/16/2021, 3:13 PM  [place]. [place], [place], [address] Phone: [phone]   Fax:  [phone]  Name: [name] MRN:  [id number] Date of Birth: [birth year]

## 2021-04-19 ENCOUNTER — Ambulatory Visit: Payer: Medicare PPO | Admitting: Physical Therapy

## 2021-04-19 ENCOUNTER — Other Ambulatory Visit: Payer: Self-pay

## 2021-04-19 ENCOUNTER — Encounter: Payer: Self-pay | Admitting: Physical Therapy

## 2021-04-19 DIAGNOSIS — M25612 Stiffness of left shoulder, not elsewhere classified: Secondary | ICD-10-CM

## 2021-04-19 DIAGNOSIS — R600 Localized edema: Secondary | ICD-10-CM

## 2021-04-19 DIAGNOSIS — Z96612 Presence of left artificial shoulder joint: Secondary | ICD-10-CM | POA: Diagnosis not present

## 2021-04-19 DIAGNOSIS — M25512 Pain in left shoulder: Secondary | ICD-10-CM

## 2021-04-19 NOTE — Patient Instructions (Signed)
Access Code: [email or web address] URL: [email or web address] Date: 04/19/2021 Prepared by: Almyra Free  Exercises Seated Shoulder Flexion Extension AAROM with Dowel into Wall - 2 x daily - 7 x weekly - 3 sets - 10 reps Supine Shoulder External Rotation with Dowel - 2-3 x daily - 7 x weekly - 1-3 sets - 10 reps Circular Shoulder Pendulum with Table Support - 1 x daily - 7 x weekly - 1 sets - 10 reps Shoulder External Rotation and Scapular Retraction - 2 x daily - 7 x weekly - 2 sets - 10 reps

## 2021-04-19 NOTE — Therapy (Signed)
[place]. [place], [place], [address] Phone: [phone]   Fax:  [phone]  Physical Therapy Treatment  Patient Details  Name: [name] MRN: [id number] Date of Birth: Aug 25, 1950 Referring Provider (PT): Mardelle Matte   Encounter Date: 04/19/2021   PT End of Session - 04/19/21 1423     Visit Number 4    Date for PT Re-Evaluation 07/03/21    Authorization Type [employer]    PT Start Time 3295    PT Stop Time 1525    PT Time Calculation (min) 61 min    Activity Tolerance Patient tolerated treatment well;Patient limited by pain    Behavior During Therapy [facility] for tasks assessed/performed             Past Medical History:  Diagnosis Date   Allergy    Anxiety    Arthritis    back & knee   Asthma     mild per pt shows up with resp illness   Colon polyps    Constipation    Diabetes mellitus without complication (HCC)    Gallstones    Gastric polyps    Gastroparesis    GERD (gastroesophageal reflux disease)    Headache    sinus headaches and migraines at times   Heart murmur    History of migraine headaches    HTN (hypertension)    Hyperlipidemia    IBS (irritable bowel syndrome)    Joint pain    Lumbar disc disease    PONV (postoperative nausea and vomiting)    S/P aortic valve replacement with bioprosthetic valve 08/23/2016   25 mm Edwards Intuity rapid-deployment bovine pericardial tissue valve via partial upper mini sternotomy   Sleep apnea    CPAP   TIA (transient ischemic attack)    TIA (transient ischemic attack)    hx of per pt    Past Surgical History:  Procedure Laterality Date   ABDOMINAL HYSTERECTOMY     AORTIC VALVE REPLACEMENT N/A 08/23/2016   Procedure: AORTIC VALVE REPLACEMENT (AVR) - using partial Upper Sternotomy- 25mm Edwards Intuity Aortic Valve used;  Surgeon: Rexene Alberts, MD;  Location: [facility];  Service: Open Heart Surgery;  Laterality: N/A;   BLADDER SUSPENSION   2007   BREAST ENHANCEMENT SURGERY  1980   CARPAL TUNNEL RELEASE Bilateral    COLONOSCOPY     DORSAL COMPARTMENT RELEASE Right 09/15/2014   Procedure: RIGHT WRIST DEQUERVAINS RELEASE ;  Surgeon: Kathryne Hitch, MD;  Location: [place];  Service: Orthopedics;  Laterality: Right;   ESOPHAGOGASTRODUODENOSCOPY     EYE SURGERY     cataract surgery   KNEE ARTHROSCOPY  04/15/2012   Procedure: ARTHROSCOPY KNEE;  Surgeon: [name], MD;  Location: [facility];  Service: Orthopedics;  Laterality: Right;   LAPAROSCOPIC CHOLECYSTECTOMY     LASIK     OOPHORECTOMY     PALATE TO GINGIVA GRAFT  2017   REVERSE SHOULDER ARTHROPLASTY Left 02/13/2021   Procedure: REVERSE SHOULDER ARTHROPLASTY;  Surgeon: Marchia Bond, MD;  [facility];  Service: Orthopedics;  Laterality: Left;   RIGHT/LEFT HEART CATH AND CORONARY ANGIOGRAPHY N/A 08/02/2016   Procedure: Right/Left Heart Cath and Coronary Angiography;  Surgeon: Sherren Mocha, MD;  Location: [place] [facility];  Service: Cardiovascular;  Laterality: N/A;   ROTATOR CUFF REPAIR Left    TEE WITHOUT CARDIOVERSION N/A 08/23/2016   Procedure: TRANSESOPHAGEAL ECHOCARDIOGRAM (TEE);  Surgeon: Rexene Alberts, MD;  Location: [facility];  Service: Open Heart Surgery;  Laterality: N/A;   TOTAL KNEE ARTHROPLASTY  04/15/2012   Procedure: TOTAL KNEE ARTHROPLASTY;  Surgeon: [name], MD;  Location: [facility];  Service: Orthopedics;  Laterality: Right;   TRIGGER FINGER RELEASE Right 04/20/2015   Procedure: RIGHT TRIGGER FINGER RELEASE (TENDON SHEALTH INCISION) ;  Surgeon: [name], MD;  Location: [facility];  Service: Orthopedics;  Laterality: Right;    There were no vitals filed for this visit.   Subjective Assessment - 04/19/21 1424     Subjective It's there.    Patient Stated Goals get back to normal, good ROM, no pain    Currently in Pain? Yes    Pain Score 2     Pain Location Shoulder    Pain Orientation Left    Pain  Descriptors / Indicators Sore                [facility] PT Assessment - 04/19/21 0001       AROM   Left Shoulder Flexion 125 Degrees   supine   Left Shoulder ABduction 78 Degrees    Left Shoulder Internal Rotation 50 Degrees   at 50 deg ABD     PROM   PROM Assessment Site Shoulder    Right/Left Shoulder Left    Left Shoulder Flexion 145 Degrees    Left Shoulder ABduction 86 Degrees    Left Shoulder External Rotation 20 Degrees                           [facility] Adult PT Treatment/Exercise - 04/19/21 0001       Shoulder Exercises: Supine   External Rotation AAROM;Left;20 reps    External Rotation Limitations using towel under arm VCs for elbow flex    Other Supine Exercises retraction with depression 5sec hold x 10      Shoulder Exercises: Seated   Other Seated Exercises AAROM: flex in chair with dowel at floor/wall joint x 15, ER with towel and TCs for correct form x 20      Shoulder Exercises: Standing   Other Standing Exercises scapular retraction x 10    Other Standing Exercises AAROM; ER x 15; ext x 10, IR x 10 with dowel   cues for depressing scapula, keeping elbow at side and not extending elbow     Shoulder Exercises: Pulleys   Flexion 2 minutes    Scaption 2 minutes      Vasopneumatic   Number Minutes Vasopneumatic  10 minutes    Vasopnuematic Location  Shoulder    Vasopneumatic Pressure Low    Vasopneumatic Temperature  34      Manual Therapy   Manual Therapy Passive ROM    Passive ROM into flex and ER                     PT Education - 04/19/21 1522     Education Details HEP    Person(s) Educated Patient    Methods Explanation;Demonstration;Handout    Comprehension Verbalized understanding;Returned demonstration              PT Short Term Goals - 04/16/21 1510       PT SHORT TERM GOAL #1   Title independent with intial HEP    Status Achieved               PT Long Term Goals - 04/19/21 1459  PT  LONG TERM GOAL #1   Title decrease pain 50%    Baseline no change in pain    Status On-going      PT LONG TERM GOAL #3   Title increase AROM of the left shoulder to 120 degrees flexion in standing    Baseline 125 in supine actively    Status Revised      PT LONG TERM GOAL #4   Title report no difficulty dressing or doing hair    Status On-going                   Plan - 04/19/21 1522     Clinical Impression Statement Patient is progressing with active and passive ROM. She has difficulty keeping left shoulder depressed with standing active motion and often compensates with exercises. We modified her HEP to help eliminate this as much as possible. Pain still gets to 6/10 with active motion and therex, but decreases at rest. She reports some tingling into digits 2 and 3 with standing scapular retraction. This improved with retraction/ER combination.  Her LTGs are ongoing.    PT Frequency 2x / week    PT Duration 12 weeks    PT Treatment/Interventions ADLs/Self Care Home Management;Cryotherapy;Electrical Stimulation;Therapeutic activities;Therapeutic exercise;Neuromuscular re-education;Manual techniques;Patient/family education;Cognitive remediation;Vasopneumatic Device    PT Next Visit Plan progress per protocol    PT Home Exercise Plan [id number]  Seated Shoulder Flexion Extension AAROM with Dowel into Wall - 2 x daily - 7 x weekly - 3 sets - 10 reps  Supine Shoulder External Rotation with Dowel - 2-3 x daily - 7 x weekly - 1-3 sets - 10 reps  Circular Shoulder Pendulum with Table Support - 1 x daily - 7 x weekly - 1 sets - 10 reps  Shoulder External Rotation and Scapular Retraction - 2 x daily - 7 x weekly - 2 sets - 10 reps    Consulted and Agree with Plan of Care Patient             Patient will benefit from skilled therapeutic intervention in order to improve the following deficits and impairments:  Decreased range of motion, Impaired UE functional use, Increased muscle  spasms, Pain, Improper body mechanics, Postural dysfunction, Increased edema, Decreased strength, Decreased mobility  Visit Diagnosis: Stiffness of left shoulder, not elsewhere classified  Acute pain of left shoulder  Localized edema     Problem List Patient Active Problem List   Diagnosis Date Noted   S/P reverse total shoulder arthroplasty, left 02/13/2021   Idiopathic hyperphosphatasia 08/03/2019   Vitamin D deficiency 06/04/2018   Hyponatremia 02/17/2018   History of CVA in adulthood 08/13/2017   Blurry vision 08/13/2017   Hypertension associated with diabetes ([place]) 01/02/2017   S/P minimally invasive aortic valve replacement with bioprosthetic valve 33/29/5188   Diastolic dysfunction    Osteopenia 05/27/2016   OSA (obstructive sleep apnea) 02/27/2016   Class 1 obesity with serious comorbidity and body mass index (BMI) of 32.0 to 32.9 in adult 10/23/2015   Bruxism 10/23/2015   Elevated alkaline phosphatase level 05/16/2015   Atypical chest pain 11/10/2014   Acne 05/12/2014   Diabetes mellitus ([place]) 05/12/2014   GERD (gastroesophageal reflux disease) 03/10/2014   Generalized anxiety disorder 03/10/2014   Migraines 03/10/2014   EUSTACHIAN TUBE DYSFUNCTION, CHRONIC 06/14/2010   Irritable bowel syndrome 12/15/2008   Low back pain 08/11/2008   Hyperlipidemia associated with type 2 diabetes mellitus ([place]) 02/09/2008   Allergic rhinitis 02/12/2007   Asthma  02/12/2007    Madelyn Flavors PT 04/19/2021, 3:45 PM  [place]. [place], [place], [address] Phone: [phone]   Fax:  [phone]  Name: [name] MRN: [id number] Date of Birth: Dec 06, 1950

## 2021-04-23 ENCOUNTER — Ambulatory Visit: Payer: Medicare PPO | Admitting: Physical Therapy

## 2021-04-23 ENCOUNTER — Encounter: Payer: Self-pay | Admitting: Physical Therapy

## 2021-04-23 ENCOUNTER — Other Ambulatory Visit: Payer: Self-pay

## 2021-04-23 DIAGNOSIS — M25512 Pain in left shoulder: Secondary | ICD-10-CM

## 2021-04-23 DIAGNOSIS — S42202D Unspecified fracture of upper end of left humerus, subsequent encounter for fracture with routine healing: Secondary | ICD-10-CM | POA: Diagnosis not present

## 2021-04-23 DIAGNOSIS — R600 Localized edema: Secondary | ICD-10-CM | POA: Diagnosis not present

## 2021-04-23 DIAGNOSIS — Z96612 Presence of left artificial shoulder joint: Secondary | ICD-10-CM | POA: Diagnosis not present

## 2021-04-23 DIAGNOSIS — M25612 Stiffness of left shoulder, not elsewhere classified: Secondary | ICD-10-CM

## 2021-04-23 NOTE — Therapy (Signed)
[place]. [place], [place], [address] Phone: [phone]   Fax:  [phone]  Physical Therapy Treatment  Patient Details  Name: [name] MRN: [id number] Date of Birth: 1951/05/06 Referring Provider (PT): Mardelle Matte   Encounter Date: 04/23/2021   PT End of Session - 04/23/21 2878     Visit Number 5    Date for PT Re-Evaluation 07/03/21    Authorization Type [employer]    PT Start Time 6767    PT Stop Time 1605    PT Time Calculation (min) 50 min    Activity Tolerance Patient tolerated treatment well    Behavior During Therapy WFL for tasks assessed/performed             Past Medical History:  Diagnosis Date   Allergy    Anxiety    Arthritis    back & knee   Asthma     mild per pt shows up with resp illness   Colon polyps    Constipation    Diabetes mellitus without complication (HCC)    Gallstones    Gastric polyps    Gastroparesis    GERD (gastroesophageal reflux disease)    Headache    sinus headaches and migraines at times   Heart murmur    History of migraine headaches    HTN (hypertension)    Hyperlipidemia    IBS (irritable bowel syndrome)    Joint pain    Lumbar disc disease    PONV (postoperative nausea and vomiting)    S/P aortic valve replacement with bioprosthetic valve 08/23/2016   25 mm Edwards Intuity rapid-deployment bovine pericardial tissue valve via partial upper mini sternotomy   Sleep apnea    CPAP   TIA (transient ischemic attack)    TIA (transient ischemic attack)    hx of per pt    Past Surgical History:  Procedure Laterality Date   ABDOMINAL HYSTERECTOMY     AORTIC VALVE REPLACEMENT N/A 08/23/2016   Procedure: AORTIC VALVE REPLACEMENT (AVR) - using partial Upper Sternotomy- 43mm Edwards Intuity Aortic Valve used;  Surgeon: Rexene Alberts, MD;  Location: [facility];  Service: Open Heart Surgery;  Laterality: N/A;   BLADDER SUSPENSION  2007   BREAST ENHANCEMENT  SURGERY  1980   CARPAL TUNNEL RELEASE Bilateral    COLONOSCOPY     DORSAL COMPARTMENT RELEASE Right 09/15/2014   Procedure: RIGHT WRIST DEQUERVAINS RELEASE ;  Surgeon: Kathryne Hitch, MD;  Location: [facility];  Service: Orthopedics;  Laterality: Right;   ESOPHAGOGASTRODUODENOSCOPY     EYE SURGERY     cataract surgery   KNEE ARTHROSCOPY  04/15/2012   Procedure: ARTHROSCOPY KNEE;  Surgeon: [name], MD;  Location: [facility];  Service: Orthopedics;  Laterality: Right;   LAPAROSCOPIC CHOLECYSTECTOMY     LASIK     OOPHORECTOMY     PALATE TO GINGIVA GRAFT  2017   REVERSE SHOULDER ARTHROPLASTY Left 02/13/2021   Procedure: REVERSE SHOULDER ARTHROPLASTY;  Surgeon: Marchia Bond, MD;  [facility];  Service: Orthopedics;  Laterality: Left;   RIGHT/LEFT HEART CATH AND CORONARY ANGIOGRAPHY N/A 08/02/2016   Procedure: Right/Left Heart Cath and Coronary Angiography;  Surgeon: Sherren Mocha, MD;  Location: [facility];  Service: Cardiovascular;  Laterality: N/A;   ROTATOR CUFF REPAIR Left    TEE WITHOUT CARDIOVERSION N/A 08/23/2016   Procedure: TRANSESOPHAGEAL ECHOCARDIOGRAM (TEE);  Surgeon: Rexene Alberts, MD;  Location: [facility];  Service:  Open Heart Surgery;  Laterality: N/A;   TOTAL KNEE ARTHROPLASTY  04/15/2012   Procedure: TOTAL KNEE ARTHROPLASTY;  Surgeon: [name], MD;  Location: [facility];  Service: Orthopedics;  Laterality: Right;   TRIGGER FINGER RELEASE Right 04/20/2015   Procedure: RIGHT TRIGGER FINGER RELEASE (TENDON SHEALTH INCISION) ;  Surgeon: [name], MD;  Location: [facility];  Service: Orthopedics;  Laterality: Right;    There were no vitals filed for this visit.   Subjective Assessment - 04/23/21 1517     Subjective "Im sore, which is normal"    Currently in Pain? Yes    Pain Score 5     Pain Location Shoulder    Pain Orientation Left                               [facility] Adult PT Treatment/Exercise -  04/23/21 0001       Shoulder Exercises: Standing   External Rotation Strengthening;Left;10 reps;Theraband    Theraband Level (Shoulder External Rotation) Level 1 (Yellow)    Internal Rotation Strengthening;Left;10 reps;Theraband    Theraband Level (Shoulder Internal Rotation) Level 1 (Yellow)    Flexion Strengthening;AROM;20 reps    ABduction Strengthening;Both;20 reps;AROM    Extension Strengthening;Both;20 reps;Theraband    Theraband Level (Shoulder Extension) Level 2 (Red)    Row Strengthening;Both;Theraband;20 reps    Theraband Level (Shoulder Row) Level 2 (Red)    Other Standing Exercises AAROM Flex, Abd, Ext, IR up back x10 1lb WaTE bar      Shoulder Exercises: Pulleys   Flexion 1 minute    Scaption 1 minute      Shoulder Exercises: ROM/Strengthening   UBE (Upper Arm Bike) L1 x2 min each      Vasopneumatic   Number Minutes Vasopneumatic  10 minutes    Vasopnuematic Location  Shoulder    Vasopneumatic Pressure Low    Vasopneumatic Temperature  34      Manual Therapy   Manual Therapy Passive ROM    Passive ROM Gentel motions of L shoulder avoiding pain                       PT Short Term Goals - 04/16/21 1510       PT SHORT TERM GOAL #1   Title independent with intial HEP    Status Achieved               PT Long Term Goals - 04/23/21 1557       PT LONG TERM GOAL #1   Title decrease pain 50%    Status On-going                   Plan - 04/23/21 1554     Clinical Impression Statement Pt reports that her MD was pleased and discontinued sling. Pt did well with an introduction to light resisted postural strengthening. Postural compensation noted with standing AAROM interventions due to decrease UE ROM. Cue for core engagement needed with standing shoulder Ext. Some end range tightness with MT, ER being most limited.    Stability/Clinical Decision Making Evolving/Moderate complexity    Rehab Potential Good    PT Frequency 2x / week     PT Duration 12 weeks    PT Treatment/Interventions ADLs/Self Care Home Management;Cryotherapy;Electrical Stimulation;Therapeutic activities;Therapeutic exercise;Neuromuscular re-education;Manual techniques;Patient/family education;Cognitive remediation;Vasopneumatic Device    PT Next Visit Plan progress per protocol  Patient will benefit from skilled therapeutic intervention in order to improve the following deficits and impairments:  Decreased range of motion, Impaired UE functional use, Increased muscle spasms, Pain, Improper body mechanics, Postural dysfunction, Increased edema, Decreased strength, Decreased mobility  Visit Diagnosis: Stiffness of left shoulder, not elsewhere classified  Localized edema  Acute pain of left shoulder     Problem List Patient Active Problem List   Diagnosis Date Noted   S/P reverse total shoulder arthroplasty, left 02/13/2021   Idiopathic hyperphosphatasia 08/03/2019   Vitamin D deficiency 06/04/2018   Hyponatremia 02/17/2018   History of CVA in adulthood 08/13/2017   Blurry vision 08/13/2017   Hypertension associated with diabetes ([place]) 01/02/2017   S/P minimally invasive aortic valve replacement with bioprosthetic valve 68/01/8109   Diastolic dysfunction    Osteopenia 05/27/2016   OSA (obstructive sleep apnea) 02/27/2016   Class 1 obesity with serious comorbidity and body mass index (BMI) of 32.0 to 32.9 in adult 10/23/2015   Bruxism 10/23/2015   Elevated alkaline phosphatase level 05/16/2015   Atypical chest pain 11/10/2014   Acne 05/12/2014   Diabetes mellitus ([place]) 05/12/2014   GERD (gastroesophageal reflux disease) 03/10/2014   Generalized anxiety disorder 03/10/2014   Migraines 03/10/2014   EUSTACHIAN TUBE DYSFUNCTION, CHRONIC 06/14/2010   Irritable bowel syndrome 12/15/2008   Low back pain 08/11/2008   Hyperlipidemia associated with type 2 diabetes mellitus ([place]) 02/09/2008   Allergic rhinitis 02/12/2007   Asthma  02/12/2007    Scot Jun, PTA 04/23/2021, 3:57 PM  [place]. [place], [place], [address] Phone: [phone]   Fax:  [phone]  Name: JENET DURIO MRN: [id number] Date of Birth: 06-27-1950

## 2021-04-24 ENCOUNTER — Ambulatory Visit (INDEPENDENT_AMBULATORY_CARE_PROVIDER_SITE_OTHER): Payer: Medicare PPO | Admitting: Physician Assistant

## 2021-04-24 ENCOUNTER — Encounter (INDEPENDENT_AMBULATORY_CARE_PROVIDER_SITE_OTHER): Payer: Self-pay | Admitting: Physician Assistant

## 2021-04-24 VITALS — BP 114/65 | HR 72 | Temp 98.8°F | Ht 66.0 in | Wt 193.0 lb

## 2021-04-24 DIAGNOSIS — Z6836 Body mass index (BMI) 36.0-36.9, adult: Secondary | ICD-10-CM | POA: Diagnosis not present

## 2021-04-24 DIAGNOSIS — E119 Type 2 diabetes mellitus without complications: Secondary | ICD-10-CM

## 2021-04-24 NOTE — Progress Notes (Signed)
Chief Complaint:   OBESITY [name] is here to discuss her progress with her obesity treatment plan along with follow-up of her obesity related diagnoses. [name] is on keeping a food journal and adhering to recommended goals of 1300-1400 calories and 100 grams of protein daily and states she is following her eating plan approximately 90% of the time. [name] states she is doing physical therapy for 60 minutes 2 times per week.  Today's visit was #: 28 Starting weight: 220 lbs Starting date: 01/19/2018 Today's weight: 193 lbs Today's date: 04/24/2021 Total lbs lost to date: 27 Total lbs lost since last in-office visit: 1  Interim History: [name] did well with weight loss. She has been doing some comfort eating, and eating french fries and hush puppies at times. She is not journaling.  Subjective:   1. Controlled type 2 diabetes mellitus without complication, without long-term current use of insulin (HCC) [name] has not been checking her blood sugars due to her arm being in a sling. Her last A1c was 6.5. She is on metformin once daily.  Assessment/Plan:   1. Controlled type 2 diabetes mellitus without complication, without long-term current use of insulin ([name]) [name] will continue her medications and meal plan. Good blood sugar control is important to decrease the likelihood of diabetic complications such as nephropathy, neuropathy, limb loss, blindness, coronary artery disease, and death. Intensive lifestyle modification including diet, exercise and weight loss are the first line of treatment for diabetes.   2. Obesity with current BMI of 31.17 [name] is currently in the action stage of change. As such, her goal is to continue with weight loss efforts. She has agreed to keeping a food journal and adhering to recommended goals of 1300-1400 calories and 100 grams of protein daily.   [name] is to eat a protein snack prior to going out to events or  restaurants.   We will recheck labs at her next visit.  Exercise goals: As is.  Behavioral modification strategies: meal planning and cooking strategies and keeping a strict food journal.  [name] has agreed to follow-up with our clinic in 2 weeks. She was informed of the importance of frequent follow-up visits to maximize her success with intensive lifestyle modifications for her multiple health conditions.   Objective:   Blood pressure 114/65, pulse 72, temperature 98.8 F (37.1 C), height 5\' 6"  (1.676 m), weight 193 lb (87.5 kg), SpO2 98 %. Body mass index is 31.15 kg/m.  General: Cooperative, alert, well developed, in no acute distress. HEENT: Conjunctivae and lids unremarkable. Cardiovascular: Regular rhythm.  Lungs: Normal work of breathing. Neurologic: No focal deficits.   Lab Results  Component Value Date   CREATININE 0.52 02/09/2021   BUN 22 02/09/2021   NA 136 02/09/2021   K 4.9 02/09/2021   CL 104 02/09/2021   CO2 26 02/09/2021   Lab Results  Component Value Date   ALT 19 08/15/2020   AST 33 08/15/2020   ALKPHOS 149 (H) 08/15/2020   BILITOT 0.5 08/15/2020   Lab Results  Component Value Date   HGBA1C 6.5 (H) 02/09/2021   HGBA1C 6.3 (H) 01/25/2021   HGBA1C 5.7 (H) 04/20/2020   HGBA1C 5.9 (A) 12/09/2019   HGBA1C 6.1 06/08/2019   Lab Results  Component Value Date   INSULIN 7.7 01/25/2021   INSULIN 13.1 04/20/2020   INSULIN 12.1 01/19/2018   Lab Results  Component Value Date   TSH 3.38 06/04/2018   Lab Results  Component Value Date  CHOL 141 04/20/2020   HDL 65 04/20/2020   LDLCALC 63 04/20/2020   LDLDIRECT 58.0 11/25/2017   TRIG 66 04/20/2020   CHOLHDL 2.2 04/20/2020   Lab Results  Component Value Date   VD25OH 74.5 01/25/2021   VD25OH 66.5 04/20/2020   VD25OH 52.21 06/08/2019   Lab Results  Component Value Date   WBC 11.5 (H) 02/09/2021   HGB 12.9 02/09/2021   HCT 41.1 02/09/2021   MCV 87.1 02/09/2021   PLT 401 (H) 02/09/2021    No results found for: IRON, TIBC, FERRITIN  Obesity Behavioral Intervention:   Approximately 15 minutes were spent on the discussion below.  ASK: We discussed the diagnosis of obesity with [name] today and [name] agreed to give [name] permission to discuss obesity behavioral modification therapy today.  ASSESS: [name] has the diagnosis of obesity and her BMI today is 31.3. [name] is in the action stage of change.   ADVISE: [name] was educated on the multiple health risks of obesity as well as the benefit of weight loss to improve her health. She was advised of the need for long term treatment and the importance of lifestyle modifications to improve her current health and to decrease her risk of future health problems.  AGREE: Multiple dietary modification options and treatment options were discussed and [name] agreed to follow the recommendations documented in the above note.  ARRANGE: [name] was educated on the importance of frequent visits to treat obesity as outlined per CMS and USPSTF guidelines and agreed to schedule her next follow up appointment today.  Attestation Statements:   Reviewed by clinician on day of visit: allergies, medications, problem list, medical history, surgical history, family history, social history, and previous encounter notes.   Wilhemena Durie, am acting as transcriptionist for [employer], PA-C.  I have reviewed the above documentation for accuracy and completeness, and I agree with the above. [name], PA-C

## 2021-04-26 ENCOUNTER — Other Ambulatory Visit: Payer: Self-pay

## 2021-04-26 ENCOUNTER — Ambulatory Visit: Payer: Medicare PPO | Attending: Orthopedic Surgery | Admitting: Physical Therapy

## 2021-04-26 ENCOUNTER — Encounter: Payer: Self-pay | Admitting: Physical Therapy

## 2021-04-26 DIAGNOSIS — M25512 Pain in left shoulder: Secondary | ICD-10-CM | POA: Diagnosis not present

## 2021-04-26 DIAGNOSIS — R600 Localized edema: Secondary | ICD-10-CM | POA: Insufficient documentation

## 2021-04-26 DIAGNOSIS — Z96612 Presence of left artificial shoulder joint: Secondary | ICD-10-CM | POA: Diagnosis not present

## 2021-04-26 DIAGNOSIS — M25612 Stiffness of left shoulder, not elsewhere classified: Secondary | ICD-10-CM | POA: Insufficient documentation

## 2021-04-26 NOTE — Therapy (Signed)
[place]. [place], [place], [address] Phone: [phone]   Fax:  [phone]  Physical Therapy Treatment  Patient Details  Name: ANNESSA SATRE MRN: [id number] Date of Birth: 30-Apr-1951 Referring Provider (PT): Mardelle Matte   Encounter Date: 04/26/2021   PT End of Session - 04/26/21 1524     Visit Number 6    Date for PT Re-Evaluation 07/03/21    Authorization Type [employer]    PT Start Time 1440    PT Stop Time 5027    PT Time Calculation (min) 55 min    Activity Tolerance Patient tolerated treatment well    Behavior During Therapy WFL for tasks assessed/performed             Past Medical History:  Diagnosis Date   Allergy    Anxiety    Arthritis    back & knee   Asthma     mild per pt shows up with resp illness   Colon polyps    Constipation    Diabetes mellitus without complication (HCC)    Gallstones    Gastric polyps    Gastroparesis    GERD (gastroesophageal reflux disease)    Headache    sinus headaches and migraines at times   Heart murmur    History of migraine headaches    HTN (hypertension)    Hyperlipidemia    IBS (irritable bowel syndrome)    Joint pain    Lumbar disc disease    PONV (postoperative nausea and vomiting)    S/P aortic valve replacement with bioprosthetic valve 08/23/2016   25 mm Edwards Intuity rapid-deployment bovine pericardial tissue valve via partial upper mini sternotomy   Sleep apnea    CPAP   TIA (transient ischemic attack)    TIA (transient ischemic attack)    hx of per pt    Past Surgical History:  Procedure Laterality Date   ABDOMINAL HYSTERECTOMY     AORTIC VALVE REPLACEMENT N/A 08/23/2016   Procedure: AORTIC VALVE REPLACEMENT (AVR) - using partial Upper Sternotomy- 58mm Edwards Intuity Aortic Valve used;  Surgeon: Rexene Alberts, MD;  Location: [facility];  Service: Open Heart Surgery;  Laterality: N/A;   BLADDER SUSPENSION  2007   BREAST ENHANCEMENT  SURGERY  1980   CARPAL TUNNEL RELEASE Bilateral    COLONOSCOPY     DORSAL COMPARTMENT RELEASE Right 09/15/2014   Procedure: RIGHT WRIST DEQUERVAINS RELEASE ;  Surgeon: Kathryne Hitch, MD;  Location: [facility];  Service: Orthopedics;  Laterality: Right;   ESOPHAGOGASTRODUODENOSCOPY     EYE SURGERY     cataract surgery   KNEE ARTHROSCOPY  04/15/2012   Procedure: ARTHROSCOPY KNEE;  Surgeon: [name], MD;  Location: [facility];  Service: Orthopedics;  Laterality: Right;   LAPAROSCOPIC CHOLECYSTECTOMY     LASIK     OOPHORECTOMY     PALATE TO GINGIVA GRAFT  2017   REVERSE SHOULDER ARTHROPLASTY Left 02/13/2021   Procedure: REVERSE SHOULDER ARTHROPLASTY;  Surgeon: Marchia Bond, MD;  [facility];  Service: Orthopedics;  Laterality: Left;   RIGHT/LEFT HEART CATH AND CORONARY ANGIOGRAPHY N/A 08/02/2016   Procedure: Right/Left Heart Cath and Coronary Angiography;  Surgeon: Sherren Mocha, MD;  Location: [facility];  Service: Cardiovascular;  Laterality: N/A;   ROTATOR CUFF REPAIR Left    TEE WITHOUT CARDIOVERSION N/A 08/23/2016   Procedure: TRANSESOPHAGEAL ECHOCARDIOGRAM (TEE);  Surgeon: Rexene Alberts, MD;  Location: [facility];  Service:  Open Heart Surgery;  Laterality: N/A;   TOTAL KNEE ARTHROPLASTY  04/15/2012   Procedure: TOTAL KNEE ARTHROPLASTY;  Surgeon: [name], MD;  Location: [facility];  Service: Orthopedics;  Laterality: Right;   TRIGGER FINGER RELEASE Right 04/20/2015   Procedure: RIGHT TRIGGER FINGER RELEASE (TENDON SHEALTH INCISION) ;  Surgeon: [name], MD;  Location: [facility];  Service: Orthopedics;  Laterality: Right;    There were no vitals filed for this visit.   Subjective Assessment - 04/26/21 1444     Subjective Still pretty sore    Currently in Pain? Yes    Pain Score 4     Pain Location Shoulder    Pain Orientation Left    Pain Descriptors / Indicators Sore    Aggravating Factors  pain up to 7/10 with reaching out                                [facility] Adult PT Treatment/Exercise - 04/26/21 0001       Shoulder Exercises: Standing   External Rotation Strengthening;Left;Theraband;20 reps    Theraband Level (Shoulder External Rotation) Level 1 (Yellow)    Internal Rotation Strengthening;Left;Theraband;20 reps    Theraband Level (Shoulder Internal Rotation) Level 1 (Yellow)    Extension Strengthening;Both;20 reps;Theraband    Theraband Level (Shoulder Extension) Level 2 (Red)    Row Strengthening;Both;Theraband;20 reps    Theraband Level (Shoulder Row) Level 2 (Red)      Shoulder Exercises: ROM/Strengthening   UBE (Upper Arm Bike) L1 x3 min each    Wall Wash felxion, CW/CCW, and finger ladder flexion      Vasopneumatic   Number Minutes Vasopneumatic  10 minutes    Vasopnuematic Location  Shoulder    Vasopneumatic Pressure Medium    Vasopneumatic Temperature  34      Manual Therapy   Manual Therapy Soft tissue mobilization;Passive ROM    Manual therapy comments all in sitting    Soft tissue mobilization to the left upper trap, the left upper arm and the teres area    Passive ROM PROM to end range a lot of cues to relax and when doing flexion as she tends to wing out                       PT Short Term Goals - 04/16/21 1510       PT SHORT TERM GOAL #1   Title independent with intial HEP    Status Achieved               PT Long Term Goals - 04/26/21 1526       PT LONG TERM GOAL #1   Title decrease pain 50%    Status On-going      PT LONG TERM GOAL #2   Title returns safely to the gym    Status On-going      PT LONG TERM GOAL #3   Title increase AROM of the left shoulder to 120 degrees flexion in standing    Status On-going                   Plan - 04/26/21 1525     Clinical Impression Statement Patient tends to really elevate the shoulder using hte upper traps, cannot do true flexion, she is very tight at first and nees a lot of cues  to relax, I did some STM  to the mm and this seemed to help relax the mms and allow more ROM    PT Next Visit Plan progress per protocol    Consulted and Agree with Plan of Care Patient             Patient will benefit from skilled therapeutic intervention in order to improve the following deficits and impairments:  Decreased range of motion, Impaired UE functional use, Increased muscle spasms, Pain, Improper body mechanics, Postural dysfunction, Increased edema, Decreased strength, Decreased mobility  Visit Diagnosis: Stiffness of left shoulder, not elsewhere classified  Localized edema  Acute pain of left shoulder  S/P reverse total shoulder arthroplasty, left     Problem List Patient Active Problem List   Diagnosis Date Noted   S/P reverse total shoulder arthroplasty, left 02/13/2021   Idiopathic hyperphosphatasia 08/03/2019   Vitamin D deficiency 06/04/2018   Hyponatremia 02/17/2018   History of CVA in adulthood 08/13/2017   Blurry vision 08/13/2017   Hypertension associated with diabetes ([place]) 01/02/2017   S/P minimally invasive aortic valve replacement with bioprosthetic valve 44/62/8638   Diastolic dysfunction    Osteopenia 05/27/2016   OSA (obstructive sleep apnea) 02/27/2016   Class 1 obesity with serious comorbidity and body mass index (BMI) of 32.0 to 32.9 in adult 10/23/2015   Bruxism 10/23/2015   Elevated alkaline phosphatase level 05/16/2015   Atypical chest pain 11/10/2014   Acne 05/12/2014   Diabetes mellitus ([place]) 05/12/2014   GERD (gastroesophageal reflux disease) 03/10/2014   Generalized anxiety disorder 03/10/2014   Migraines 03/10/2014   EUSTACHIAN TUBE DYSFUNCTION, CHRONIC 06/14/2010   Irritable bowel syndrome 12/15/2008   Low back pain 08/11/2008   Hyperlipidemia associated with type 2 diabetes mellitus ([place]) 02/09/2008   Allergic rhinitis 02/12/2007   Asthma 02/12/2007    Sumner Boast, PT 04/26/2021, 3:27 PM  [place]. [place], [place], [address] Phone: [phone]   Fax:  [phone]  Name: SHYLYN YOUNCE MRN: [id number] Date of Birth: 11-16-1950

## 2021-04-27 ENCOUNTER — Ambulatory Visit: Payer: Medicare PPO

## 2021-04-30 ENCOUNTER — Other Ambulatory Visit: Payer: Self-pay

## 2021-04-30 ENCOUNTER — Encounter: Payer: Self-pay | Admitting: Physical Therapy

## 2021-04-30 ENCOUNTER — Ambulatory Visit: Payer: Medicare PPO | Admitting: Physical Therapy

## 2021-04-30 DIAGNOSIS — R600 Localized edema: Secondary | ICD-10-CM | POA: Diagnosis not present

## 2021-04-30 DIAGNOSIS — M25612 Stiffness of left shoulder, not elsewhere classified: Secondary | ICD-10-CM | POA: Diagnosis not present

## 2021-04-30 DIAGNOSIS — M25512 Pain in left shoulder: Secondary | ICD-10-CM | POA: Diagnosis not present

## 2021-04-30 DIAGNOSIS — Z96612 Presence of left artificial shoulder joint: Secondary | ICD-10-CM

## 2021-04-30 NOTE — Therapy (Signed)
[facility]. [place], [place], [address] Phone: [phone]   Fax:  [phone]  Physical Therapy Treatment  Patient Details  Name: [name] MRN: [id number] Date of Birth: 03-27-1951 Referring Provider (PT): Mardelle Matte   Encounter Date: 04/30/2021   PT End of Session - 04/30/21 1549     Visit Number 7    Date for PT Re-Evaluation 07/03/21    Authorization Type [employer]    PT Start Time 1510    PT Stop Time 1600    PT Time Calculation (min) 50 min    Activity Tolerance Patient tolerated treatment well    Behavior During Therapy WFL for tasks assessed/performed             Past Medical History:  Diagnosis Date   Allergy    Anxiety    Arthritis    back & knee   Asthma     mild per pt shows up with resp illness   Colon polyps    Constipation    Diabetes mellitus without complication (HCC)    Gallstones    Gastric polyps    Gastroparesis    GERD (gastroesophageal reflux disease)    Headache    sinus headaches and migraines at times   Heart murmur    History of migraine headaches    HTN (hypertension)    Hyperlipidemia    IBS (irritable bowel syndrome)    Joint pain    Lumbar disc disease    PONV (postoperative nausea and vomiting)    S/P aortic valve replacement with bioprosthetic valve 08/23/2016   25 mm Edwards Intuity rapid-deployment bovine pericardial tissue valve via partial upper mini sternotomy   Sleep apnea    CPAP   TIA (transient ischemic attack)    TIA (transient ischemic attack)    hx of per pt    Past Surgical History:  Procedure Laterality Date   ABDOMINAL HYSTERECTOMY     AORTIC VALVE REPLACEMENT N/A 08/23/2016   Procedure: AORTIC VALVE REPLACEMENT (AVR) - using partial Upper Sternotomy- 31mm Edwards Intuity Aortic Valve used;  Surgeon: Rexene Alberts, MD;  Location: [facility];  Service: Open Heart Surgery;  Laterality: N/A;   BLADDER SUSPENSION  2007   BREAST ENHANCEMENT  SURGERY  1980   CARPAL TUNNEL RELEASE Bilateral    COLONOSCOPY     DORSAL COMPARTMENT RELEASE Right 09/15/2014   Procedure: RIGHT WRIST DEQUERVAINS RELEASE ;  Surgeon: Kathryne Hitch, MD;  Location: [facility];  Service: Orthopedics;  Laterality: Right;   ESOPHAGOGASTRODUODENOSCOPY     EYE SURGERY     cataract surgery   KNEE ARTHROSCOPY  04/15/2012   Procedure: ARTHROSCOPY KNEE;  Surgeon: [name], MD;  Location: [facility];  Service: Orthopedics;  Laterality: Right;   LAPAROSCOPIC CHOLECYSTECTOMY     LASIK     OOPHORECTOMY     PALATE TO GINGIVA GRAFT  2017   REVERSE SHOULDER ARTHROPLASTY Left 02/13/2021   Procedure: REVERSE SHOULDER ARTHROPLASTY;  Surgeon: Marchia Bond, MD;  [facility];  Service: Orthopedics;  Laterality: Left;   RIGHT/LEFT HEART CATH AND CORONARY ANGIOGRAPHY N/A 08/02/2016   Procedure: Right/Left Heart Cath and Coronary Angiography;  Surgeon: Sherren Mocha, MD;  Location: [facility];  Service: Cardiovascular;  Laterality: N/A;   ROTATOR CUFF REPAIR Left    TEE WITHOUT CARDIOVERSION N/A 08/23/2016   Procedure: TRANSESOPHAGEAL ECHOCARDIOGRAM (TEE);  Surgeon: Rexene Alberts, MD;  Location: [facility];  Service:  Open Heart Surgery;  Laterality: N/A;   TOTAL KNEE ARTHROPLASTY  04/15/2012   Procedure: TOTAL KNEE ARTHROPLASTY;  Surgeon: [name], MD;  Location: [facility];  Service: Orthopedics;  Laterality: Right;   TRIGGER FINGER RELEASE Right 04/20/2015   Procedure: RIGHT TRIGGER FINGER RELEASE (TENDON SHEALTH INCISION) ;  Surgeon: [name], MD;  Location: [facility];  Service: Orthopedics;  Laterality: Right;    There were no vitals filed for this visit.   Subjective Assessment - 04/30/21 1509     Subjective "Im good "    Currently in Pain? Yes    Pain Score 4    UBE   Pain Location Shoulder    Pain Orientation Left                               [facility] Adult PT Treatment/Exercise - 04/30/21 0001        Shoulder Exercises: Standing   External Rotation Strengthening;Left;Theraband;20 reps    Theraband Level (Shoulder External Rotation) Level 1 (Yellow)    Internal Rotation Strengthening;Left;Theraband;20 reps    Theraband Level (Shoulder Internal Rotation) Level 1 (Yellow)    Flexion Strengthening;AROM;20 reps    ABduction Strengthening;Both;20 reps;AROM    Extension Strengthening;Both;20 reps;Theraband    Theraband Level (Shoulder Extension) Level 2 (Red)    Row Strengthening;Both;Theraband;20 reps    Theraband Level (Shoulder Row) Level 2 (Red)      Shoulder Exercises: ROM/Strengthening   UBE (Upper Arm Bike) L1 x3 min each    Wall Wash felxion, CW/CCW, and finger ladder flexion      Vasopneumatic   Number Minutes Vasopneumatic  10 minutes    Vasopnuematic Location  Shoulder    Vasopneumatic Pressure Medium    Vasopneumatic Temperature  34      Manual Therapy   Manual Therapy Soft tissue mobilization;Passive ROM    Soft tissue mobilization to the left upper trap, the left upper arm and the teres area    Passive ROM PROM to end range a lot of cues to relax and when doing flexion as she tends to wing out                       PT Short Term Goals - 04/16/21 1510       PT SHORT TERM GOAL #1   Title independent with intial HEP    Status Achieved               PT Long Term Goals - 04/26/21 1526       PT LONG TERM GOAL #1   Title decrease pain 50%    Status On-going      PT LONG TERM GOAL #2   Title returns safely to the gym    Status On-going      PT LONG TERM GOAL #3   Title increase AROM of the left shoulder to 120 degrees flexion in standing    Status On-going                   Plan - 04/30/21 1550     Clinical Impression Statement Pt able to complete today's interventions. Some postural compensation noted with the exercises due to decrease L shoulder ROM. L shoulder elevation noted with active shoulder flexion and abduction.  Some dense muscle tissue noted in the L anterior deltoid and pec. Cues needed to relax with PROM  Stability/Clinical Decision Making Evolving/Moderate complexity    Rehab Potential Good    PT Frequency 2x / week    PT Duration 12 weeks    PT Treatment/Interventions ADLs/Self Care Home Management;Cryotherapy;Electrical Stimulation;Therapeutic activities;Therapeutic exercise;Neuromuscular re-education;Manual techniques;Patient/family education;Cognitive remediation;Vasopneumatic Device    PT Next Visit Plan progress per protocol             Patient will benefit from skilled therapeutic intervention in order to improve the following deficits and impairments:  Decreased range of motion, Impaired UE functional use, Increased muscle spasms, Pain, Improper body mechanics, Postural dysfunction, Increased edema, Decreased strength, Decreased mobility  Visit Diagnosis: Localized edema  Acute pain of left shoulder  S/P reverse total shoulder arthroplasty, left  Stiffness of left shoulder, not elsewhere classified     Problem List Patient Active Problem List   Diagnosis Date Noted   S/P reverse total shoulder arthroplasty, left 02/13/2021   Idiopathic hyperphosphatasia 08/03/2019   Vitamin D deficiency 06/04/2018   Hyponatremia 02/17/2018   History of CVA in adulthood 08/13/2017   Blurry vision 08/13/2017   Hypertension associated with diabetes ([place]) 01/02/2017   S/P minimally invasive aortic valve replacement with bioprosthetic valve 50/93/2671   Diastolic dysfunction    Osteopenia 05/27/2016   OSA (obstructive sleep apnea) 02/27/2016   Class 1 obesity with serious comorbidity and body mass index (BMI) of 32.0 to 32.9 in adult 10/23/2015   Bruxism 10/23/2015   Elevated alkaline phosphatase level 05/16/2015   Atypical chest pain 11/10/2014   Acne 05/12/2014   Diabetes mellitus ([place]) 05/12/2014   GERD (gastroesophageal reflux disease) 03/10/2014   Generalized anxiety disorder  03/10/2014   Migraines 03/10/2014   EUSTACHIAN TUBE DYSFUNCTION, CHRONIC 06/14/2010   Irritable bowel syndrome 12/15/2008   Low back pain 08/11/2008   Hyperlipidemia associated with type 2 diabetes mellitus ([place]) 02/09/2008   Allergic rhinitis 02/12/2007   Asthma 02/12/2007    Scot Jun, PTA 04/30/2021, 3:54 PM  [place]. [place], [place], [address] Phone: [phone]   Fax:  [phone]  Name: KITA NEACE MRN: [id number] Date of Birth: 02/01/1951

## 2021-05-03 ENCOUNTER — Other Ambulatory Visit: Payer: Self-pay

## 2021-05-03 ENCOUNTER — Ambulatory Visit: Payer: Medicare PPO | Admitting: Physical Therapy

## 2021-05-03 ENCOUNTER — Encounter: Payer: Self-pay | Admitting: Physical Therapy

## 2021-05-03 DIAGNOSIS — R600 Localized edema: Secondary | ICD-10-CM

## 2021-05-03 DIAGNOSIS — M25512 Pain in left shoulder: Secondary | ICD-10-CM

## 2021-05-03 DIAGNOSIS — M25612 Stiffness of left shoulder, not elsewhere classified: Secondary | ICD-10-CM

## 2021-05-03 DIAGNOSIS — Z96612 Presence of left artificial shoulder joint: Secondary | ICD-10-CM | POA: Diagnosis not present

## 2021-05-03 NOTE — Therapy (Signed)
[facility]. [place], [place], [address] Phone: [phone]   Fax:  [phone]  Physical Therapy Treatment  Patient Details  Name: [name] MRN: [id number] Date of Birth: Jan 24, 1951 Referring Provider (PT): Mardelle Matte   Encounter Date: 05/03/2021   PT End of Session - 05/03/21 1552     Visit Number 8    Date for PT Re-Evaluation 07/03/21    Authorization Type [employer]    PT Start Time 1513    PT Stop Time 1603    PT Time Calculation (min) 50 min    Activity Tolerance Patient tolerated treatment well;Patient limited by pain    Behavior During Therapy [facility] for tasks assessed/performed             Past Medical History:  Diagnosis Date   Allergy    Anxiety    Arthritis    back & knee   Asthma     mild per pt shows up with resp illness   Colon polyps    Constipation    Diabetes mellitus without complication (HCC)    Gallstones    Gastric polyps    Gastroparesis    GERD (gastroesophageal reflux disease)    Headache    sinus headaches and migraines at times   Heart murmur    History of migraine headaches    HTN (hypertension)    Hyperlipidemia    IBS (irritable bowel syndrome)    Joint pain    Lumbar disc disease    PONV (postoperative nausea and vomiting)    S/P aortic valve replacement with bioprosthetic valve 08/23/2016   25 mm Edwards Intuity rapid-deployment bovine pericardial tissue valve via partial upper mini sternotomy   Sleep apnea    CPAP   TIA (transient ischemic attack)    TIA (transient ischemic attack)    hx of per pt    Past Surgical History:  Procedure Laterality Date   ABDOMINAL HYSTERECTOMY     AORTIC VALVE REPLACEMENT N/A 08/23/2016   Procedure: AORTIC VALVE REPLACEMENT (AVR) - using partial Upper Sternotomy- 75mm Edwards Intuity Aortic Valve used;  Surgeon: Rexene Alberts, MD;  Location: [facility];  Service: Open Heart Surgery;  Laterality: N/A;   BLADDER SUSPENSION   2007   BREAST ENHANCEMENT SURGERY  1980   CARPAL TUNNEL RELEASE Bilateral    COLONOSCOPY     DORSAL COMPARTMENT RELEASE Right 09/15/2014   Procedure: RIGHT WRIST DEQUERVAINS RELEASE ;  Surgeon: Kathryne Hitch, MD;  Location: [facility];  Service: Orthopedics;  Laterality: Right;   ESOPHAGOGASTRODUODENOSCOPY     EYE SURGERY     cataract surgery   KNEE ARTHROSCOPY  04/15/2012   Procedure: ARTHROSCOPY KNEE;  Surgeon: [name], MD;  Location: [facility];  Service: Orthopedics;  Laterality: Right;   LAPAROSCOPIC CHOLECYSTECTOMY     LASIK     OOPHORECTOMY     PALATE TO GINGIVA GRAFT  2017   REVERSE SHOULDER ARTHROPLASTY Left 02/13/2021   Procedure: REVERSE SHOULDER ARTHROPLASTY;  Surgeon: Marchia Bond, MD;  [facility];  Service: Orthopedics;  Laterality: Left;   RIGHT/LEFT HEART CATH AND CORONARY ANGIOGRAPHY N/A 08/02/2016   Procedure: Right/Left Heart Cath and Coronary Angiography;  Surgeon: Sherren Mocha, MD;  Location: [facility];  Service: Cardiovascular;  Laterality: N/A;   ROTATOR CUFF REPAIR Left    TEE WITHOUT CARDIOVERSION N/A 08/23/2016   Procedure: TRANSESOPHAGEAL ECHOCARDIOGRAM (TEE);  Surgeon: Rexene Alberts, MD;  Location: [facility];  Service: Open Heart Surgery;  Laterality: N/A;   TOTAL KNEE ARTHROPLASTY  04/15/2012   Procedure: TOTAL KNEE ARTHROPLASTY;  Surgeon: [name], MD;  Location: [facility];  Service: Orthopedics;  Laterality: Right;   TRIGGER FINGER RELEASE Right 04/20/2015   Procedure: RIGHT TRIGGER FINGER RELEASE (TENDON SHEALTH INCISION) ;  Surgeon: [name], MD;  Location: [facility];  Service: Orthopedics;  Laterality: Right;    There were no vitals filed for this visit.   Subjective Assessment - 05/03/21 1514     Subjective Pain after last session that got better last session.    Currently in Pain? Yes    Pain Score 3     Pain Location Shoulder    Pain Orientation Left                                [facility] Adult PT Treatment/Exercise - 05/03/21 0001       Shoulder Exercises: Standing   Flexion Strengthening;AROM;20 reps    ABduction Strengthening;Both;20 reps;AROM    Extension Strengthening;Both;20 reps;Theraband    Theraband Level (Shoulder Extension) Level 2 (Red)    Row Strengthening;Both;Theraband;20 reps    Theraband Level (Shoulder Row) Level 2 (Red)    Other Standing Exercises AAROM Flex, Abd,  Abd, Ext, IR up back x10 1lb WaTE bar      Shoulder Exercises: ROM/Strengthening   UBE (Upper Arm Bike) L1 x3 min each    Nustep L4 x 4 min      Vasopneumatic   Number Minutes Vasopneumatic  10 minutes    Vasopnuematic Location  Shoulder    Vasopneumatic Pressure Medium    Vasopneumatic Temperature  34      Manual Therapy   Manual Therapy Soft tissue mobilization;Passive ROM    Manual therapy comments all in sitting    Soft tissue mobilization to the left upper trap, the left upper arm and the teres area                       PT Short Term Goals - 04/16/21 1510       PT SHORT TERM GOAL #1   Title independent with intial HEP    Status Achieved               PT Long Term Goals - 04/26/21 1526       PT LONG TERM GOAL #1   Title decrease pain 50%    Status On-going      PT LONG TERM GOAL #2   Title returns safely to the gym    Status On-going      PT LONG TERM GOAL #3   Title increase AROM of the left shoulder to 120 degrees flexion in standing    Status On-going                   Plan - 05/03/21 1552     Clinical Impression Statement Pt enters clinic reporting increase pain from last session that improved yesterday.  Regressed session due to pt complaints of pain. L shoulder elevation and postural compensation noted with standing shoulder abduction and flexion. Cues to prevent shoulder elevation with standing rows. Dense muscle tissue noted in the anterior deltoid and pec area.     Stability/Clinical Decision Making Evolving/Moderate complexity    Rehab Potential Good    PT Frequency 2x / week  PT Duration 12 weeks    PT Treatment/Interventions ADLs/Self Care Home Management;Cryotherapy;Electrical Stimulation;Therapeutic activities;Therapeutic exercise;Neuromuscular re-education;Manual techniques;Patient/family education;Cognitive remediation;Vasopneumatic Device    PT Next Visit Plan progress per protocol             Patient will benefit from skilled therapeutic intervention in order to improve the following deficits and impairments:  Decreased range of motion, Impaired UE functional use, Increased muscle spasms, Pain, Improper body mechanics, Postural dysfunction, Increased edema, Decreased strength, Decreased mobility  Visit Diagnosis: Localized edema  Stiffness of left shoulder, not elsewhere classified  S/P reverse total shoulder arthroplasty, left  Acute pain of left shoulder     Problem List Patient Active Problem List   Diagnosis Date Noted   S/P reverse total shoulder arthroplasty, left 02/13/2021   Idiopathic hyperphosphatasia 08/03/2019   Vitamin D deficiency 06/04/2018   Hyponatremia 02/17/2018   History of CVA in adulthood 08/13/2017   Blurry vision 08/13/2017   Hypertension associated with diabetes ([place]) 01/02/2017   S/P minimally invasive aortic valve replacement with bioprosthetic valve 75/03/2584   Diastolic dysfunction    Osteopenia 05/27/2016   OSA (obstructive sleep apnea) 02/27/2016   Class 1 obesity with serious comorbidity and body mass index (BMI) of 32.0 to 32.9 in adult 10/23/2015   Bruxism 10/23/2015   Elevated alkaline phosphatase level 05/16/2015   Atypical chest pain 11/10/2014   Acne 05/12/2014   Diabetes mellitus ([place]) 05/12/2014   GERD (gastroesophageal reflux disease) 03/10/2014   Generalized anxiety disorder 03/10/2014   Migraines 03/10/2014   EUSTACHIAN TUBE DYSFUNCTION, CHRONIC 06/14/2010   Irritable  bowel syndrome 12/15/2008   Low back pain 08/11/2008   Hyperlipidemia associated with type 2 diabetes mellitus ([place]) 02/09/2008   Allergic rhinitis 02/12/2007   Asthma 02/12/2007    Scot Jun, PTA 05/03/2021, 3:55 PM  [facility]. [place], [place], [address] Phone: [phone]   Fax:  [phone]  Name: [name] MRN: [id number] Date of Birth: [birth year]

## 2021-05-07 ENCOUNTER — Other Ambulatory Visit: Payer: Self-pay

## 2021-05-07 ENCOUNTER — Encounter: Payer: Self-pay | Admitting: Physical Therapy

## 2021-05-07 ENCOUNTER — Ambulatory Visit: Payer: Medicare PPO | Admitting: Physical Therapy

## 2021-05-07 DIAGNOSIS — M25612 Stiffness of left shoulder, not elsewhere classified: Secondary | ICD-10-CM | POA: Diagnosis not present

## 2021-05-07 DIAGNOSIS — Z96612 Presence of left artificial shoulder joint: Secondary | ICD-10-CM

## 2021-05-07 DIAGNOSIS — R600 Localized edema: Secondary | ICD-10-CM

## 2021-05-07 DIAGNOSIS — M25512 Pain in left shoulder: Secondary | ICD-10-CM

## 2021-05-07 NOTE — Therapy (Signed)
[facility] [place], [place], [address] Phone: [phone]   Fax:  [phone]  Physical Therapy Treatment  Patient Details  Name: [name] MRN: [id number] Date of Birth: [birth year] Referring Provider (PT): Mardelle Matte   Encounter Date: 05/07/2021   PT End of Session - 05/07/21 5643     Visit Number 9    Date for PT Re-Evaluation 07/03/21    Authorization Type [employer]    PT Start Time 1510    PT Stop Time 1600    PT Time Calculation (min) 50 min    Activity Tolerance Patient tolerated treatment well    Behavior During Therapy WFL for tasks assessed/performed             Past Medical History:  Diagnosis Date   Allergy    Anxiety    Arthritis    back & knee   Asthma     mild per pt shows up with resp illness   Colon polyps    Constipation    Diabetes mellitus without complication (HCC)    Gallstones    Gastric polyps    Gastroparesis    GERD (gastroesophageal reflux disease)    Headache    sinus headaches and migraines at times   Heart murmur    History of migraine headaches    HTN (hypertension)    Hyperlipidemia    IBS (irritable bowel syndrome)    Joint pain    Lumbar disc disease    PONV (postoperative nausea and vomiting)    S/P aortic valve replacement with bioprosthetic valve 08/23/2016   25 mm Edwards Intuity rapid-deployment bovine pericardial tissue valve via partial upper mini sternotomy   Sleep apnea    CPAP   TIA (transient ischemic attack)    TIA (transient ischemic attack)    hx of per pt    Past Surgical History:  Procedure Laterality Date   ABDOMINAL HYSTERECTOMY     AORTIC VALVE REPLACEMENT N/A 08/23/2016   Procedure: AORTIC VALVE REPLACEMENT (AVR) - using partial Upper Sternotomy- 77mm Edwards Intuity Aortic Valve used;  Surgeon: Rexene Alberts, MD;  Location: [facility];  Service: Open Heart Surgery;  Laterality: N/A;   BLADDER SUSPENSION  2007   BREAST ENHANCEMENT  SURGERY  1980   CARPAL TUNNEL RELEASE Bilateral    COLONOSCOPY     DORSAL COMPARTMENT RELEASE Right 09/15/2014   Procedure: RIGHT WRIST DEQUERVAINS RELEASE ;  Surgeon: Kathryne Hitch, MD;  Location: [facility];  Service: Orthopedics;  Laterality: Right;   ESOPHAGOGASTRODUODENOSCOPY     EYE SURGERY     cataract surgery   KNEE ARTHROSCOPY  04/15/2012   Procedure: ARTHROSCOPY KNEE;  Surgeon: [name], MD;  Location: [facility];  Service: Orthopedics;  Laterality: Right;   LAPAROSCOPIC CHOLECYSTECTOMY     LASIK     OOPHORECTOMY     PALATE TO GINGIVA GRAFT  2017   REVERSE SHOULDER ARTHROPLASTY Left 02/13/2021   Procedure: REVERSE SHOULDER ARTHROPLASTY;  Surgeon: Marchia Bond, MD;  Location: [facility];  Service: Orthopedics;  Laterality: Left;   RIGHT/LEFT HEART CATH AND CORONARY ANGIOGRAPHY N/A 08/02/2016   Procedure: Right/Left Heart Cath and Coronary Angiography;  Surgeon: Sherren Mocha, MD;  Location: [facility];  Service: Cardiovascular;  Laterality: N/A;   ROTATOR CUFF REPAIR Left    TEE WITHOUT CARDIOVERSION N/A 08/23/2016   Procedure: TRANSESOPHAGEAL ECHOCARDIOGRAM (TEE);  Surgeon: Rexene Alberts, MD;  Location: [facility];  Service:  Open Heart Surgery;  Laterality: N/A;   TOTAL KNEE ARTHROPLASTY  04/15/2012   Procedure: TOTAL KNEE ARTHROPLASTY;  Surgeon: [name], MD;  Location: [facility];  Service: Orthopedics;  Laterality: Right;   TRIGGER FINGER RELEASE Right 04/20/2015   Procedure: RIGHT TRIGGER FINGER RELEASE (TENDON SHEALTH INCISION) ;  Surgeon: [name], MD;  Location: [facility];  Service: Orthopedics;  Laterality: Right;    There were no vitals filed for this visit.   Subjective Assessment - 05/07/21 1514     Subjective About a 3 as far as pain goes, Felt better after last session    Currently in Pain? Yes    Pain Score 3     Pain Location Shoulder    Pain Orientation Left                [facility] PT Assessment - 05/07/21 0001        AROM   Left Shoulder Flexion 110 Degrees   standing   Left Shoulder ABduction 76 Degrees   standing                          [facility] Adult PT Treatment/Exercise - 05/07/21 0001       Shoulder Exercises: Standing   External Rotation Strengthening;Left;Theraband;20 reps    Theraband Level (Shoulder External Rotation) Level 1 (Yellow)    Theraband Level (Shoulder Flexion) Level 2 (Red)    Extension Strengthening;Both;20 reps;Theraband    Theraband Level (Shoulder Extension) Level 2 (Red)    Row Strengthening;Both;Theraband;20 reps    Theraband Level (Shoulder Row) Level 2 (Red)      Shoulder Exercises: ROM/Strengthening   UBE (Upper Arm Bike) L1 x3 min each    Nustep L4 x 4 min    Wall [place] flexion, CW/CCW, and finger ladder flexion    Other ROM/Strengthening Exercises LUE 2 level cabinet reaces x10 flex 7 abd      Vasopneumatic   Number Minutes Vasopneumatic  10 minutes    Vasopnuematic Location  Shoulder    Vasopneumatic Pressure Medium    Vasopneumatic Temperature  34      Manual Therapy   Manual Therapy Soft tissue mobilization;Passive ROM    Soft tissue mobilization to the left upper trap, the left upper arm and the teres area    Passive ROM PROM to end range a lot of cues to relax and when doing flexion as she tends to wing out                       PT Short Term Goals - 04/16/21 1510       PT SHORT TERM GOAL #1   Title independent with intial HEP    Status Achieved               PT Long Term Goals - 04/26/21 1526       PT LONG TERM GOAL #1   Title decrease pain 50%    Status On-going      PT LONG TERM GOAL #2   Title returns safely to the gym    Status On-going      PT LONG TERM GOAL #3   Title increase AROM of the left shoulder to 120 degrees flexion in standing    Status On-going                   Plan - 05/07/21 1554  Clinical Impression Statement L shoulder AROM is limited overall, pt unable  to keep elbow in with L shoulder flexion despite cues. She did good with both standing rows and extensions. Tactile cue for elbows needed with internal and external rotation. Very little motion noted with L shoulder external rotation with light resistance. Difficulty reaching second level with abduction.    Stability/Clinical Decision Making Evolving/Moderate complexity    Rehab Potential Good    PT Frequency 2x / week    PT Duration 12 weeks    PT Next Visit Plan progress per protocol             Patient will benefit from skilled therapeutic intervention in order to improve the following deficits and impairments:  Decreased range of motion, Impaired UE functional use, Increased muscle spasms, Pain, Improper body mechanics, Postural dysfunction, Increased edema, Decreased strength, Decreased mobility  Visit Diagnosis: Localized edema  Stiffness of left shoulder, not elsewhere classified  S/P reverse total shoulder arthroplasty, left  Acute pain of left shoulder     Problem List Patient Active Problem List   Diagnosis Date Noted   S/P reverse total shoulder arthroplasty, left 02/13/2021   Idiopathic hyperphosphatasia 08/03/2019   Vitamin D deficiency 06/04/2018   Hyponatremia 02/17/2018   History of CVA in adulthood 08/13/2017   Blurry vision 08/13/2017   Hypertension associated with diabetes ([place]) 01/02/2017   S/P minimally invasive aortic valve replacement with bioprosthetic valve 59/29/2446   Diastolic dysfunction    Osteopenia 05/27/2016   OSA (obstructive sleep apnea) 02/27/2016   Class 1 obesity with serious comorbidity and body mass index (BMI) of 32.0 to 32.9 in adult 10/23/2015   Bruxism 10/23/2015   Elevated alkaline phosphatase level 05/16/2015   Atypical chest pain 11/10/2014   Acne 05/12/2014   Diabetes mellitus ([place]) 05/12/2014   GERD (gastroesophageal reflux disease) 03/10/2014   Generalized anxiety disorder 03/10/2014   Migraines 03/10/2014    EUSTACHIAN TUBE DYSFUNCTION, CHRONIC 06/14/2010   Irritable bowel syndrome 12/15/2008   Low back pain 08/11/2008   Hyperlipidemia associated with type 2 diabetes mellitus ([place]) 02/09/2008   Allergic rhinitis 02/12/2007   Asthma 02/12/2007    Scot Jun, PTA 05/07/2021, 3:59 PM  [facility] [place], [place], [address] Phone: [phone]   Fax:  [phone]  Name: [name] MRN: [id number] Date of Birth: Jun 11, 1951

## 2021-05-09 ENCOUNTER — Other Ambulatory Visit: Payer: Self-pay

## 2021-05-09 ENCOUNTER — Ambulatory Visit: Payer: Medicare PPO | Admitting: Physical Therapy

## 2021-05-09 ENCOUNTER — Encounter: Payer: Self-pay | Admitting: Physical Therapy

## 2021-05-09 DIAGNOSIS — R600 Localized edema: Secondary | ICD-10-CM

## 2021-05-09 DIAGNOSIS — Z96612 Presence of left artificial shoulder joint: Secondary | ICD-10-CM | POA: Diagnosis not present

## 2021-05-09 DIAGNOSIS — M25612 Stiffness of left shoulder, not elsewhere classified: Secondary | ICD-10-CM | POA: Diagnosis not present

## 2021-05-09 DIAGNOSIS — M25512 Pain in left shoulder: Secondary | ICD-10-CM

## 2021-05-09 NOTE — Progress Notes (Signed)
Office Visit    Patient Name: [name] Date of Encounter: 05/10/2021  PCP:  [name], MD   [facility] Cardiologist:  Freada Bergeron, MD  Advanced Practice Provider:  No care team member to display Electrophysiologist:  None    Chief Complaint    [name] is a 70 y.o. female with a hx of Diabetes mellitus type 2, hyperlipidemia, OSA on CPAP and aortic valve replacement  presents today for 6 month follow-up to establish care. She was a previous patient of Dr. Meda Coffee.    Past Medical History    Past Medical History:  Diagnosis Date   Allergy    Anxiety    Arthritis    back & knee   Asthma     mild per pt shows up with resp illness   Colon polyps    Constipation    Diabetes mellitus without complication (HCC)    Gallstones    Gastric polyps    Gastroparesis    GERD (gastroesophageal reflux disease)    Headache    sinus headaches and migraines at times   Heart murmur    History of migraine headaches    HTN (hypertension)    Hyperlipidemia    IBS (irritable bowel syndrome)    Joint pain    Lumbar disc disease    PONV (postoperative nausea and vomiting)    S/P aortic valve replacement with bioprosthetic valve 08/23/2016   25 mm Edwards Intuity rapid-deployment bovine pericardial tissue valve via partial upper mini sternotomy   Sleep apnea    CPAP   TIA (transient ischemic attack)    TIA (transient ischemic attack)    hx of per pt   Past Surgical History:  Procedure Laterality Date   ABDOMINAL HYSTERECTOMY     AORTIC VALVE REPLACEMENT N/A 08/23/2016   Procedure: AORTIC VALVE REPLACEMENT (AVR) - using partial Upper Sternotomy- 66m Edwards Intuity Aortic Valve used;  Surgeon: CRexene Alberts MD;  Location[facility]  Service: Open Heart Surgery;  Laterality: N/A;   BLADDER SUSPENSION  2007   BREAST ENHANCEMENT SURGERY  1980   CARPAL TUNNEL RELEASE Bilateral    COLONOSCOPY     DORSAL COMPARTMENT RELEASE  Right 09/15/2014   Procedure: RIGHT WRIST DEQUERVAINS RELEASE ;  Surgeon: DKathryne Hitch MD;  [facility]  Service: Orthopedics;  Laterality: Right;   ESOPHAGOGASTRODUODENOSCOPY     EYE SURGERY     cataract surgery   KNEE ARTHROSCOPY  04/15/2012   Procedure: ARTHROSCOPY KNEE;  Surgeon: DNinetta Lights MD;  [facility]  Service: Orthopedics;  Laterality: Right;   LAPAROSCOPIC CHOLECYSTECTOMY     LASIK     OOPHORECTOMY     PALATE TO GINGIVA GRAFT  2017   REVERSE SHOULDER ARTHROPLASTY Left 02/13/2021   Procedure: REVERSE SHOULDER ARTHROPLASTY;  Surgeon: LMarchia Bond MD;  [facility];  Service: Orthopedics;  Laterality: Left;   RIGHT/LEFT HEART CATH AND CORONARY ANGIOGRAPHY N/A 08/02/2016   Procedure: Right/Left Heart Cath and Coronary Angiography;  Surgeon: MSherren Mocha MD;  [facility];  Service: Cardiovascular;  Laterality: N/A;   ROTATOR CUFF REPAIR Left    TEE WITHOUT CARDIOVERSION N/A 08/23/2016   Procedure: TRANSESOPHAGEAL ECHOCARDIOGRAM (TEE);  Surgeon: CRexene Alberts MD;  [facility]  Service: Open Heart Surgery;  Laterality: N/A;   TOTAL KNEE ARTHROPLASTY  04/15/2012   Procedure: TOTAL KNEE ARTHROPLASTY;  Surgeon: DNinetta Lights MD;  Location: [facility];  Service: Orthopedics;  Laterality: Right;   TRIGGER FINGER RELEASE Right 04/20/2015   Procedure: RIGHT TRIGGER FINGER RELEASE (TENDON SHEALTH INCISION) ;  Surgeon: [name], MD;  Location: [facility];  Service: Orthopedics;  Laterality: Right;    Allergies  Allergies  Allergen Reactions   Augmentin [Amoxicillin-Pot Clavulanate] Rash   Erythromycin Nausea Only   Penicillins Itching and Rash    Has patient had a PCN reaction causing immediate rash, facial/tongue/throat swelling, SOB or lightheadedness with hypotension: No Has patient had a PCN reaction causing severe rash involving mucus membranes or skin necrosis: No Has patient had a PCN reaction  that required hospitalization: No Has patient had a PCN reaction occurring within the last 10 years: No  If all of the above answers are "NO", then may proceed with Cephalosporin use. Tolerated Ancef 02/13/21    History of Present Illness    [name] is a 70 y.o. female with a hx of diabetes mellitus type 2, hyperlipidemia, OSA on CPAP, and aortic valve replacement with bioprosthetic bovine pericardial tissue valve on 08/23/2016.   She was last seen March 2022 by Dr. Meda Coffee.  Patient had only minor nonobstructive CAD on heart catheterization back in February 2018 which was done in preparation for her AVR.  Also her right heart cath showed normal right heart hemodynamics.  Echocardiogram showed LVEF of 60 to 65% and grade 1 diastolic dysfunction.  She underwent complete rehab program and dietary program and has lost 30 pounds.  When she was last seen by Dr. Meda Coffee she had been doing great and denied any lower extremity edema, orthopnea, or proximal nocturnal dyspnea.  She was not limited to any of her daily activities at that time.  She noticed that her blood pressure has been elevated into the 140s.  She is compliant with all her medications and a low-sodium diet.  At that time her most recent echocardiogram was reviewed which was done in March 2022 and showed normal transaortic gradients with mean 14 mmHg, stable mild to moderate perivalvular leak, she had a 3 out of 6 diastolic murmur.  Dr. Meda Coffee recommended annual echocardiograms and close following of her symptoms.  She was asymptomatic at that time.  She was also euvolemic and doing great with her exercise and diet program.  Her lipid panel was at goal on atorvastatin.  She recently had a left reverse total shoulder arthroplasty by Dr. Mardelle Matte on 02/13/2021 and is currently undergoing physical therapy for this.  She is also being followed by the [facility] and undergoing lifestyle modifications.  Today, she feels  good from a heart standpoint. She has been recovering from her left shoulder surgery that she had back in August. She is doing PT twice a week at this point and tolerating well. She still is having some pain from her surgery but no chest pain. She has not had any swelling in her feet. She has not had any shortness of breath. She otherwise feels good today. She has not had a Lipid panel since 2020. We will plan to repeat this when she has her labs done in December. She has been compliant with all her medications but her BP was high today 172/70, however on repeat it was 142/72. She is usually " normal" when they check her BP at [facility].   Reports no shortness of breath nor dyspnea on exertion. Reports no chest pain, pressure, or tightness. No edema,  orthopnea, PND. Reports no palpitations.     EKGs/Labs/Other Studies Reviewed:   The following studies were reviewed today:  ECHOCARDIOGRAM 08/21/2020 IMPRESSIONS     1. Left ventricular ejection fraction, by estimation, is 60 to 65%. Left  ventricular ejection fraction by 3D volume is 59 %. The left ventricle has  normal function. The left ventricle has no regional wall motion  abnormalities. Left ventricular diastolic   parameters are consistent with Grade II diastolic dysfunction  (pseudonormalization).   2. Right ventricular systolic function is normal. The right ventricular  size is normal. There is normal pulmonary artery systolic pressure. The  estimated right ventricular systolic pressure is 28.0 mmHg.   3. The mitral valve is normal in structure. Mild mitral valve  regurgitation. No evidence of mitral stenosis.   4. The aortic valve has been repaired/replaced. There is a 25 mm Edwards  Intuit valve present in the aortic position. Aortic valve regurgitation is  not visualized. No aortic stenosis is present. There is moderate  perivalvular aortic insufficiency.  Aortic regurgitation PHT measures 406 msec. Aortic valve  mean gradient  measures 14.0 mmHg. Aortic valve peak gradient measures 25.8 mmHg. Aortic  valve area, by VTI measures 1.93 cm.   5. Aortic dilatation noted. There is borderline dilatation of the  ascending aorta, measuring 37 mm.   6. The inferior vena cava is normal in size with greater than 50%  respiratory variability, suggesting right atrial pressure of 3 mmHg.   7. Left atrial size was severely dilated.   CARDIAC CATH 08/02/2016  Left Anterior Descending  There is mild the vessel.  Left Circumflex  Vessel is angiographically normal.  Right Coronary Artery  The vessel exhibits minimal luminal irregularities.  Intervention  No interventions have been documented. Coronary Diagrams  Diagnostic Dominance: Right   EKG:  No EKG done today.   Recent Labs: 08/15/2020: ALT 19; Pro B Natriuretic peptide (BNP) 58.0 01/25/2021: Magnesium 1.9 02/09/2021: BUN 22; Creatinine, Ser 0.52; Hemoglobin 12.9; Platelets 401; Potassium 4.9; Sodium 136  Recent Lipid Panel    Component Value Date/Time   CHOL 141 04/20/2020 1036   TRIG 66 04/20/2020 1036   HDL 65 04/20/2020 1036   CHOLHDL 2.2 04/20/2020 1036   CHOLHDL 2 06/08/2019 0949   VLDL 13.0 06/08/2019 0949   LDLCALC 63 04/20/2020 1036   LDLDIRECT 58.0 11/25/2017 1156    Home Medications   Current Meds  Medication Sig   ACCU-CHEK GUIDE test strip USE TO TEST BLOOD SUGARS DAILY. DX: E11.9   Accu-Chek Softclix Lancets lancets USE AS INSTRUCTED   atorvastatin (LIPITOR) 40 MG tablet TAKE 1 TABLET (40 MG TOTAL) BY MOUTH DAILY AT 6 PM.   Biotin 5000 MCG TABS Take 5,000 mcg by mouth daily.    blood glucose meter kit and supplies KIT Dispense based on patient and insurance preference. Use up to four times daily as directed. Dx: E11.9   calcium-vitamin D (OSCAL WITH D) 500-200 MG-UNIT per tablet Take 2 tablets by mouth daily.    Cholecalciferol (VITAMIN D) 125 MCG (5000 UT) CAPS Take 5,000 Units by mouth in the morning.   clopidogrel  (PLAVIX) 75 MG tablet TAKE 1 TABLET BY MOUTH EVERY DAY   demeclocycline (DECLOMYCIN) 300 MG tablet TAKE 1 TABLET (300 MG TOTAL) BY MOUTH 2 (TWO) TIMES DAILY.   fluticasone (FLONASE) 50 MCG/ACT nasal spray Place 2 sprays into both nostrils in the morning.   ipratropium (ATROVENT) 0.03 % nasal spray 2 SPRAYS IN EACH NOSTRIL AS NEEDED  THREE TIMES AS NEEDED   loratadine (CLARITIN) 10 MG tablet Take 10 mg by mouth at bedtime.    losartan (COZAAR) 100 MG tablet Take 100 mg by mouth daily. Pt takes in the pm per pt   Melatonin 5 MG TABS Take 5 mg by mouth at bedtime as needed (sleep).   metFORMIN (GLUCOPHAGE) 500 MG tablet Take 500 mg by mouth daily with breakfast.   metoCLOPramide (REGLAN) 10 MG tablet TAKE 1/2 TABLET BY MOUTH 4 TIMES A DAY   Multiple Vitamin (MULTIVITAMIN WITH MINERALS) TABS tablet Take 1 tablet by mouth in the morning.   omeprazole (PRILOSEC) 40 MG capsule Take 40 mg by mouth daily.   ondansetron (ZOFRAN ODT) 4 MG disintegrating tablet Take 1 tablet (4 mg total) by mouth every 8 (eight) hours as needed.   ondansetron (ZOFRAN) 4 MG tablet Take 1 tablet (4 mg total) by mouth every 8 (eight) hours as needed for nausea or vomiting.   Probiotic Product (ALIGN) 4 MG CAPS Take 4 mg by mouth daily.    psyllium (METAMUCIL SMOOTH TEXTURE) 28 % packet Take 1 packet by mouth 2 (two) times daily.   sodium chloride 1 g tablet Take 3 g by mouth 2 (two) times daily with a meal.   traMADol (ULTRAM) 50 MG tablet    venlafaxine XR (EFFEXOR-XR) 75 MG 24 hr capsule TAKE 1 CAPSULE BY MOUTH DAILY WITH BREAKFAST.   [DISCONTINUED] amLODipine (NORVASC) 10 MG tablet Take 1 tablet (10 mg total) by mouth daily. Please keep upcoming appt in September 2022 with Cardiologist before anymore refills. Thank you   [DISCONTINUED] cloNIDine (CATAPRES) 0.1 MG tablet Take 1 tablet (0.1 mg total) by mouth at bedtime.     Review of Systems      Endorses left arm pain and limited range of motion from her surgery.    Physical Exam  repeat BP 142/70  VS:  BP (!) 172/70 (BP Location: Right Arm, Patient Position: Sitting, Cuff Size: Large)   Pulse 78   Ht 5' 6"  (1.676 m)   Wt 202 lb 9.6 oz (91.9 kg)   LMP  (LMP Unknown)   SpO2 98%   BMI 32.70 kg/m  , BMI Body mass index is 32.7 kg/m.  Wt Readings from Last 3 Encounters:  05/10/21 202 lb 9.6 oz (91.9 kg)  04/24/21 193 lb (87.5 kg)  04/04/21 194 lb (88 kg)     GEN: Well nourished, well developed, in no acute distress. HEENT: normal. Cardiac: RRR, no murmurs, rubs, or gallops. No clubbing, cyanosis, edema.  Radials/PT 2+ and equal bilaterally.  Respiratory:  Respirations regular and unlabored, clear to auscultation bilaterally. GI: Soft, nontender, nondistended. MS: No deformity or atrophy. Skin: Warm and dry, no rash. Neuro:  Strength and sensation are intact. Psych: Normal affect.  Assessment & Plan    Status post minimally invasive aortic valve replacement with bioprosthetic valve 08/23/2016-most recent echocardiogram was March 2022 which showed a normal transaortic gradient with a mean of 14 mmHg, stable mild to moderate paravalvular leak, she had a 3 out of 6 diastolic murmur at that time.  Recommended annual echocardiograms and follow-up.  We will plan order at her 6 month f/u appointment. Currently asymptomatic.   Chronic diastolic CHF-currently euvolemic on exam. Encourage daily weights at home.  If she notices a weight increase of 2 pounds overnight or 5 pounds in a week she is to call our office.  She is currently not needing any diuretic therapy. She has lost 10  lbs since her surgery. She is going to PT two days a week to strengthen her shoulder and is on a specialized diet.   Hyperlipidemia-most recent lipid panel showed LDL 64.  She has not had a lipid panel since December 2020.  Lipid panel and LFTs done at [facility] in December.    OSA-currently compliant with CPAP  Hypertension-she is on clonidine 0.1 mg daily,  Norvasc 38m daily  and losartan 100 mg daily.BP well controlled. Continue current antihypertensive regimen.    Disposition: Follow up in 6 month(s) with HFreada Bergeron MD or APP.  Signed, TElgie Collard PA-C 05/10/2021, 3:53 PM [facility]

## 2021-05-09 NOTE — Therapy (Signed)
[facility] [place], [place], [address] Phone: [phone]   Fax:  [phone]  Progress Note    Physical Therapy Treatment Progress Note Reporting Period 04/02/21  to 05/09/21  See note below for Objective Data and Assessment of Progress/Goals.      Patient Details  Name: [name] MRN: [id number] Date of Birth: Nov 09, 1950 Referring Provider (PT): Mardelle Matte   Encounter Date: 05/09/2021   PT End of Session - 05/09/21 1702     Visit Number 10    Number of Visits 13    Date for PT Re-Evaluation 05/22/21    Authorization Type [employer]    PT Start Time 1520    PT Stop Time 1324    PT Time Calculation (min) 50 min    Activity Tolerance Patient tolerated treatment well    Behavior During Therapy WFL for tasks assessed/performed             Past Medical History:  Diagnosis Date   Allergy    Anxiety    Arthritis    back & knee   Asthma     mild per pt shows up with resp illness   Colon polyps    Constipation    Diabetes mellitus without complication (HCC)    Gallstones    Gastric polyps    Gastroparesis    GERD (gastroesophageal reflux disease)    Headache    sinus headaches and migraines at times   Heart murmur    History of migraine headaches    HTN (hypertension)    Hyperlipidemia    IBS (irritable bowel syndrome)    Joint pain    Lumbar disc disease    PONV (postoperative nausea and vomiting)    S/P aortic valve replacement with bioprosthetic valve 08/23/2016   25 mm Edwards Intuity rapid-deployment bovine pericardial tissue valve via partial upper mini sternotomy   Sleep apnea    CPAP   TIA (transient ischemic attack)    TIA (transient ischemic attack)    hx of per pt    Past Surgical History:  Procedure Laterality Date   ABDOMINAL HYSTERECTOMY     AORTIC VALVE REPLACEMENT N/A 08/23/2016   Procedure: AORTIC VALVE REPLACEMENT (AVR) - using partial Upper Sternotomy- 91m  Edwards Intuity Aortic Valve used;  Surgeon: CRexene Alberts MD;  Location[facility]  Service: Open Heart Surgery;  Laterality: N/A;   BLADDER SUSPENSION  2007   BREAST ENHANCEMENT SURGERY  1980   CARPAL TUNNEL RELEASE Bilateral    COLONOSCOPY     DORSAL COMPARTMENT RELEASE Right 09/15/2014   Procedure: RIGHT WRIST DEQUERVAINS RELEASE ;  Surgeon: DKathryne Hitch MD;  [facility]  Service: Orthopedics;  Laterality: Right;   ESOPHAGOGASTRODUODENOSCOPY     EYE SURGERY     cataract surgery   KNEE ARTHROSCOPY  04/15/2012   Procedure: ARTHROSCOPY KNEE;  Surgeon: DNinetta Lights MD;  [facility]  Service: Orthopedics;  Laterality: Right;   LAPAROSCOPIC CHOLECYSTECTOMY     LASIK     OOPHORECTOMY     PALATE TO GINGIVA GRAFT  2017   REVERSE SHOULDER ARTHROPLASTY Left 02/13/2021   Procedure: REVERSE SHOULDER ARTHROPLASTY;  Surgeon: LMarchia Bond MD;  Location: [facility];  Service: Orthopedics;  Laterality: Left;   RIGHT/LEFT HEART CATH AND CORONARY ANGIOGRAPHY N/A 08/02/2016   Procedure: Right/Left Heart Cath and Coronary Angiography;  Surgeon: MSherren Mocha MD;  [facility];  Service: Cardiovascular;  Laterality: N/A;   ROTATOR CUFF REPAIR Left    TEE WITHOUT CARDIOVERSION N/A 08/23/2016   Procedure: TRANSESOPHAGEAL ECHOCARDIOGRAM (TEE);  Surgeon: Rexene Alberts, MD;  Location: [facility];  Service: Open Heart Surgery;  Laterality: N/A;   TOTAL KNEE ARTHROPLASTY  04/15/2012   Procedure: TOTAL KNEE ARTHROPLASTY;  Surgeon: [name], MD;  Location: [facility];  Service: Orthopedics;  Laterality: Right;   TRIGGER FINGER RELEASE Right 04/20/2015   Procedure: RIGHT TRIGGER FINGER RELEASE (TENDON SHEALTH INCISION) ;  Surgeon: [name], MD;  Location: [facility];  Service: Orthopedics;  Laterality: Right;    There were no vitals filed for this visit.   Subjective Assessment - 05/09/21 1536     Subjective "About the same, pain hits about a  three when in on the machines"    Currently in Pain? Yes    Pain Score 3     Pain Location Shoulder    Pain Orientation Left                [facility] PT Assessment - 05/09/21 0001       PROM   Left Shoulder Flexion 133 Degrees    Left Shoulder ABduction 90 Degrees    Left Shoulder External Rotation 30 Degrees                           [facility] Adult PT Treatment/Exercise - 05/09/21 0001       Shoulder Exercises: Standing   External Rotation Strengthening;Left;Theraband;20 reps    Theraband Level (Shoulder External Rotation) Level 1 (Yellow)    Flexion Strengthening;AROM;20 reps    Theraband Level (Shoulder Flexion) Level 2 (Red)    Extension Strengthening;Both;20 reps;Theraband    Theraband Level (Shoulder Extension) Level 2 (Red)    Row Strengthening;Both;Theraband;20 reps    Theraband Level (Shoulder Row) Level 2 (Red)      Shoulder Exercises: ROM/Strengthening   UBE (Upper Arm Bike) L1.5 x3 min each    Nustep L4 x 5 min   Simultaneous filing. User may not have seen previous data.     Vasopneumatic   Number Minutes Vasopneumatic  10 minutes    Vasopnuematic Location  Shoulder    Vasopneumatic Pressure Medium    Vasopneumatic Temperature  34      Manual Therapy   Manual Therapy Soft tissue mobilization;Passive ROM    Soft tissue mobilization to the left upper trap, the left upper arm and the teres area    Passive ROM PROM to end range a lot of cues to relax and when doing flexion as she tends to wing out                       PT Short Term Goals - 04/16/21 1510       PT SHORT TERM GOAL #1   Title independent with intial HEP    Status Achieved               PT Long Term Goals - 05/09/21 1707       PT LONG TERM GOAL #1   Title decrease pain 50%    Status Partially Met      PT LONG TERM GOAL #2   Title returns safely to the gym    Status On-going      PT LONG TERM GOAL #3   Title increase AROM of the left shoulder to  120 degrees flexion in  standing    Status On-going                   Plan - 05/09/21 1703     Clinical Impression Statement Patient is now 3 months out she remains tight in the left shoulder mms and the joint, her ROM is progressing.  She has weakness.  Still some pain at end ranges.WE are following the MD protocol.    PT Next Visit Plan continue to progress protocol, try to gain functional ROM and strength    Consulted and Agree with Plan of Care Patient             Patient will benefit from skilled therapeutic intervention in order to improve the following deficits and impairments:  Decreased range of motion, Impaired UE functional use, Increased muscle spasms, Pain, Improper body mechanics, Postural dysfunction, Increased edema, Decreased strength, Decreased mobility  Visit Diagnosis: Localized edema  Stiffness of left shoulder, not elsewhere classified  S/P reverse total shoulder arthroplasty, left  Acute pain of left shoulder     Problem List Patient Active Problem List   Diagnosis Date Noted   S/P reverse total shoulder arthroplasty, left 02/13/2021   Idiopathic hyperphosphatasia 08/03/2019   Vitamin D deficiency 06/04/2018   Hyponatremia 02/17/2018   History of CVA in adulthood 08/13/2017   Blurry vision 08/13/2017   Hypertension associated with diabetes ([place]) 01/02/2017   S/P minimally invasive aortic valve replacement with bioprosthetic valve 77/41/2878   Diastolic dysfunction    Osteopenia 05/27/2016   OSA (obstructive sleep apnea) 02/27/2016   Class 1 obesity with serious comorbidity and body mass index (BMI) of 32.0 to 32.9 in adult 10/23/2015   Bruxism 10/23/2015   Elevated alkaline phosphatase level 05/16/2015   Atypical chest pain 11/10/2014   Acne 05/12/2014   Diabetes mellitus ([place]) 05/12/2014   GERD (gastroesophageal reflux disease) 03/10/2014   Generalized anxiety disorder 03/10/2014   Migraines 03/10/2014   EUSTACHIAN TUBE  DYSFUNCTION, CHRONIC 06/14/2010   Irritable bowel syndrome 12/15/2008   Low back pain 08/11/2008   Hyperlipidemia associated with type 2 diabetes mellitus ([place]) 02/09/2008   Allergic rhinitis 02/12/2007   Asthma 02/12/2007    Sumner Boast, PT 05/09/2021, 5:07 PM  [facility]. [place], [place], [address] Phone: [phone]   Fax:  [phone]  Name: [name] MRN: [id number] Date of Birth: [birth year]

## 2021-05-10 ENCOUNTER — Encounter (HOSPITAL_BASED_OUTPATIENT_CLINIC_OR_DEPARTMENT_OTHER): Payer: Self-pay | Admitting: Physician Assistant

## 2021-05-10 ENCOUNTER — Ambulatory Visit (HOSPITAL_BASED_OUTPATIENT_CLINIC_OR_DEPARTMENT_OTHER): Payer: Medicare PPO | Admitting: Physician Assistant

## 2021-05-10 ENCOUNTER — Ambulatory Visit (INDEPENDENT_AMBULATORY_CARE_PROVIDER_SITE_OTHER): Payer: Medicare PPO | Admitting: Physician Assistant

## 2021-05-10 VITALS — BP 172/70 | HR 78 | Ht 66.0 in | Wt 202.6 lb

## 2021-05-10 DIAGNOSIS — E782 Mixed hyperlipidemia: Secondary | ICD-10-CM

## 2021-05-10 DIAGNOSIS — I5032 Chronic diastolic (congestive) heart failure: Secondary | ICD-10-CM | POA: Diagnosis not present

## 2021-05-10 DIAGNOSIS — G4733 Obstructive sleep apnea (adult) (pediatric): Secondary | ICD-10-CM

## 2021-05-10 DIAGNOSIS — I10 Essential (primary) hypertension: Secondary | ICD-10-CM | POA: Diagnosis not present

## 2021-05-10 DIAGNOSIS — Z953 Presence of xenogenic heart valve: Secondary | ICD-10-CM

## 2021-05-10 DIAGNOSIS — I152 Hypertension secondary to endocrine disorders: Secondary | ICD-10-CM | POA: Diagnosis not present

## 2021-05-10 DIAGNOSIS — I351 Nonrheumatic aortic (valve) insufficiency: Secondary | ICD-10-CM

## 2021-05-10 DIAGNOSIS — Z79899 Other long term (current) drug therapy: Secondary | ICD-10-CM

## 2021-05-10 DIAGNOSIS — E1159 Type 2 diabetes mellitus with other circulatory complications: Secondary | ICD-10-CM

## 2021-05-10 MED ORDER — CLONIDINE HCL 0.1 MG PO TABS: 0.1000 mg | ORAL_TABLET | Freq: Every day | ORAL | 3 refills | Status: DC

## 2021-05-10 MED ORDER — AMLODIPINE BESYLATE 10 MG PO TABS: 10.0000 mg | ORAL_TABLET | Freq: Every day | ORAL | 3 refills | Status: DC

## 2021-05-10 NOTE — Patient Instructions (Signed)
Medication Instructions:  Your Physician recommend you continue on your current medication as directed.    *If you need a refill on your cardiac medications before your next appointment, please call your pharmacy*   Lab Work: None today- Please have a fasting Lipid Panel and Liver Function Tests at your appointment with [facility]   If you have labs (blood work) drawn today and your tests are completely normal, you will receive your results only by: MyChart Message (if you have MyChart) OR A paper copy in the mail If you have any lab test that is abnormal or we need to change your treatment, we will call you to review the results.   Testing/Procedures: None ordered today    Follow-Up: At [facility], you and your health needs are our priority.  As part of our continuing mission to provide you with exceptional heart care, we have created designated Provider Care Teams.  These Care Teams include your primary Cardiologist (physician) and Advanced Practice Providers (APPs -  Physician Assistants and Nurse Practitioners) who all work together to provide you with the care you need, when you need it.  We recommend signing up for the patient portal called "MyChart".  Sign up information is provided on this After Visit Summary.  MyChart is used to connect with patients for Virtual Visits (Telemedicine).  Patients are able to view lab/test results, encounter notes, upcoming appointments, etc.  Non-urgent messages can be sent to your provider as well.   To learn more about what you can do with MyChart, go to [email or web address].    Your next appointment:   6 month(s)  The format for your next appointment:   In Person  Provider:   Freada Bergeron, MD  or APPs    Other Instructions None today  '

## 2021-05-14 ENCOUNTER — Encounter: Payer: Self-pay | Admitting: Physical Therapy

## 2021-05-14 ENCOUNTER — Ambulatory Visit: Payer: Medicare PPO | Admitting: Physical Therapy

## 2021-05-14 ENCOUNTER — Other Ambulatory Visit: Payer: Self-pay

## 2021-05-14 DIAGNOSIS — R600 Localized edema: Secondary | ICD-10-CM | POA: Diagnosis not present

## 2021-05-14 DIAGNOSIS — M25612 Stiffness of left shoulder, not elsewhere classified: Secondary | ICD-10-CM | POA: Diagnosis not present

## 2021-05-14 DIAGNOSIS — Z96612 Presence of left artificial shoulder joint: Secondary | ICD-10-CM | POA: Diagnosis not present

## 2021-05-14 DIAGNOSIS — M25512 Pain in left shoulder: Secondary | ICD-10-CM

## 2021-05-14 NOTE — Therapy (Signed)
[facility]. [place], [place], [address] Phone: [phone]   Fax:  [phone]  Physical Therapy Treatment  Patient Details  Name: BREONIA KIRSTEIN MRN: [id number] Date of Birth: 1951/06/24 Referring Provider (PT): Mardelle Matte   Encounter Date: 05/14/2021   PT End of Session - 05/14/21 1551     Visit Number 11    Date for PT Re-Evaluation 05/22/21    Authorization Type [employer]    PT Start Time 3244    PT Stop Time 1600    PT Time Calculation (min) 37 min    Activity Tolerance Patient tolerated treatment well    Behavior During Therapy WFL for tasks assessed/performed             Past Medical History:  Diagnosis Date   Allergy    Anxiety    Arthritis    back & knee   Asthma     mild per pt shows up with resp illness   Colon polyps    Constipation    Diabetes mellitus without complication (HCC)    Gallstones    Gastric polyps    Gastroparesis    GERD (gastroesophageal reflux disease)    Headache    sinus headaches and migraines at times   Heart murmur    History of migraine headaches    HTN (hypertension)    Hyperlipidemia    IBS (irritable bowel syndrome)    Joint pain    Lumbar disc disease    PONV (postoperative nausea and vomiting)    S/P aortic valve replacement with bioprosthetic valve 08/23/2016   25 mm Edwards Intuity rapid-deployment bovine pericardial tissue valve via partial upper mini sternotomy   Sleep apnea    CPAP   TIA (transient ischemic attack)    TIA (transient ischemic attack)    hx of per pt    Past Surgical History:  Procedure Laterality Date   ABDOMINAL HYSTERECTOMY     AORTIC VALVE REPLACEMENT N/A 08/23/2016   Procedure: AORTIC VALVE REPLACEMENT (AVR) - using partial Upper Sternotomy- 31m Edwards Intuity Aortic Valve used;  Surgeon: CRexene Alberts MD;  [facility]  Service: Open Heart Surgery;  Laterality: N/A;   BLADDER SUSPENSION  2007   BREAST ENHANCEMENT  SURGERY  1980   CARPAL TUNNEL RELEASE Bilateral    COLONOSCOPY     DORSAL COMPARTMENT RELEASE Right 09/15/2014   Procedure: RIGHT WRIST DEQUERVAINS RELEASE ;  Surgeon: DKathryne Hitch MD;  [facility]  Service: Orthopedics;  Laterality: Right;   ESOPHAGOGASTRODUODENOSCOPY     EYE SURGERY     cataract surgery   KNEE ARTHROSCOPY  04/15/2012   Procedure: ARTHROSCOPY KNEE;  Surgeon: DNinetta Lights MD;  [facility]  Service: Orthopedics;  Laterality: Right;   LAPAROSCOPIC CHOLECYSTECTOMY     LASIK     OOPHORECTOMY     PALATE TO GINGIVA GRAFT  2017   REVERSE SHOULDER ARTHROPLASTY Left 02/13/2021   Procedure: REVERSE SHOULDER ARTHROPLASTY;  Surgeon: LMarchia Bond MD;  [facility];  Service: Orthopedics;  Laterality: Left;   RIGHT/LEFT HEART CATH AND CORONARY ANGIOGRAPHY N/A 08/02/2016   Procedure: Right/Left Heart Cath and Coronary Angiography;  Surgeon: MSherren Mocha MD;  [facility];  Service: Cardiovascular;  Laterality: N/A;   ROTATOR CUFF REPAIR Left    TEE WITHOUT CARDIOVERSION N/A 08/23/2016   Procedure: TRANSESOPHAGEAL ECHOCARDIOGRAM (TEE);  Surgeon: CRexene Alberts MD;  [facility]  Service:  Open Heart Surgery;  Laterality: N/A;   TOTAL KNEE ARTHROPLASTY  04/15/2012   Procedure: TOTAL KNEE ARTHROPLASTY;  Surgeon: [name], MD;  Location: [facility];  Service: Orthopedics;  Laterality: Right;   TRIGGER FINGER RELEASE Right 04/20/2015   Procedure: RIGHT TRIGGER FINGER RELEASE (TENDON SHEALTH INCISION) ;  Surgeon: [name], MD;  Location: [facility];  Service: Orthopedics;  Laterality: Right;    There were no vitals filed for this visit.   Subjective Assessment - 05/14/21 1523     Subjective "Well my shoulder hurt, I think the cold weather affects it"    Currently in Pain? Yes    Pain Score 3     Pain Location Shoulder    Pain Orientation Left                               [facility]  Adult PT Treatment/Exercise - 05/14/21 0001       Shoulder Exercises: Standing   External Rotation Strengthening;Left;Theraband;20 reps    Theraband Level (Shoulder External Rotation) Level 1 (Yellow)    Flexion AROM;Strengthening;Both;20 reps    Extension Strengthening;Both;20 reps;Theraband    Theraband Level (Shoulder Extension) Level 2 (Red)    Row Strengthening;Both;Theraband;20 reps    Theraband Level (Shoulder Row) Level 2 (Red)      Shoulder Exercises: ROM/Strengthening   UBE (Upper Arm Bike) L1 x3 min each    Nustep L4 x 5 min    Other ROM/Strengthening Exercises LUE 2 level cabinet reaches  x10 flex &  abd    Other ROM/Strengthening Exercises AAROM flex, Ext, IR up back x10      Vasopneumatic   Number Minutes Vasopneumatic  10 minutes    Vasopnuematic Location  Shoulder    Vasopneumatic Pressure Medium    Vasopneumatic Temperature  34      Manual Therapy   Manual Therapy Soft tissue mobilization;Passive ROM    Soft tissue mobilization to the left upper trap, the left upper arm and the teres area    Passive ROM PROM to end range a lot of cues to relax and when doing flexion as she tends to wing out                       PT Short Term Goals - 04/16/21 1510       PT SHORT TERM GOAL #1   Title independent with intial HEP    Status Achieved               PT Long Term Goals - 05/09/21 1707       PT LONG TERM GOAL #1   Title decrease pain 50%    Status Partially Met      PT LONG TERM GOAL #2   Title returns safely to the gym    Status On-going      PT LONG TERM GOAL #3   Title increase AROM of the left shoulder to 120 degrees flexion in standing    Status On-going                   Plan - 05/14/21 1551     Clinical Impression Statement Pt ~ 8 minutes late for today's session. L shoulder remains tight with anterior muscles and joint. L shoulder ER is her biggest limitation. Cues needed for pacing not to complete AROM  interventions too fast. Difficulty keeping elbow in  the flexion.    Stability/Clinical Decision Making Evolving/Moderate complexity    Rehab Potential Good    PT Frequency 2x / week    PT Duration 12 weeks    PT Treatment/Interventions ADLs/Self Care Home Management;Cryotherapy;Electrical Stimulation;Therapeutic activities;Therapeutic exercise;Neuromuscular re-education;Manual techniques;Patient/family education;Cognitive remediation;Vasopneumatic Device    PT Next Visit Plan continue to progress protocol, try to gain functional ROM and strength             Patient will benefit from skilled therapeutic intervention in order to improve the following deficits and impairments:  Decreased range of motion, Impaired UE functional use, Increased muscle spasms, Pain, Improper body mechanics, Postural dysfunction, Increased edema, Decreased strength, Decreased mobility  Visit Diagnosis: Localized edema  S/P reverse total shoulder arthroplasty, left  Acute pain of left shoulder  Stiffness of left shoulder, not elsewhere classified     Problem List Patient Active Problem List   Diagnosis Date Noted   S/P reverse total shoulder arthroplasty, left 02/13/2021   Idiopathic hyperphosphatasia 08/03/2019   Vitamin D deficiency 06/04/2018   Hyponatremia 02/17/2018   History of CVA in adulthood 08/13/2017   Blurry vision 08/13/2017   Hypertension associated with diabetes ([place]) 01/02/2017   S/P minimally invasive aortic valve replacement with bioprosthetic valve 07/02/3233   Diastolic dysfunction    Osteopenia 05/27/2016   OSA (obstructive sleep apnea) 02/27/2016   Class 1 obesity with serious comorbidity and body mass index (BMI) of 32.0 to 32.9 in adult 10/23/2015   Bruxism 10/23/2015   Elevated alkaline phosphatase level 05/16/2015   Atypical chest pain 11/10/2014   Acne 05/12/2014   Diabetes mellitus ([place]) 05/12/2014   GERD (gastroesophageal reflux disease) 03/10/2014   Generalized  anxiety disorder 03/10/2014   Migraines 03/10/2014   EUSTACHIAN TUBE DYSFUNCTION, CHRONIC 06/14/2010   Irritable bowel syndrome 12/15/2008   Low back pain 08/11/2008   Hyperlipidemia associated with type 2 diabetes mellitus ([place]) 02/09/2008   Allergic rhinitis 02/12/2007   Asthma 02/12/2007    Scot Jun, PTA 05/14/2021, 3:54 PM  [place]. [place], [place], [address] Phone: [phone]   Fax:  [phone]  Name: [name] MRN: [id number] Date of Birth: 06-16-1951

## 2021-05-15 DIAGNOSIS — H26492 Other secondary cataract, left eye: Secondary | ICD-10-CM | POA: Diagnosis not present

## 2021-05-15 DIAGNOSIS — E119 Type 2 diabetes mellitus without complications: Secondary | ICD-10-CM | POA: Diagnosis not present

## 2021-05-15 DIAGNOSIS — Z961 Presence of intraocular lens: Secondary | ICD-10-CM | POA: Diagnosis not present

## 2021-05-15 DIAGNOSIS — Z7984 Long term (current) use of oral hypoglycemic drugs: Secondary | ICD-10-CM | POA: Diagnosis not present

## 2021-05-15 DIAGNOSIS — H524 Presbyopia: Secondary | ICD-10-CM | POA: Diagnosis not present

## 2021-05-16 ENCOUNTER — Ambulatory Visit: Payer: Medicare PPO | Admitting: Physical Therapy

## 2021-05-16 ENCOUNTER — Other Ambulatory Visit: Payer: Self-pay

## 2021-05-16 ENCOUNTER — Encounter: Payer: Self-pay | Admitting: Physical Therapy

## 2021-05-16 DIAGNOSIS — R600 Localized edema: Secondary | ICD-10-CM

## 2021-05-16 DIAGNOSIS — M25512 Pain in left shoulder: Secondary | ICD-10-CM | POA: Diagnosis not present

## 2021-05-16 DIAGNOSIS — Z96612 Presence of left artificial shoulder joint: Secondary | ICD-10-CM

## 2021-05-16 DIAGNOSIS — M25612 Stiffness of left shoulder, not elsewhere classified: Secondary | ICD-10-CM | POA: Diagnosis not present

## 2021-05-16 NOTE — Therapy (Signed)
[facility] [place], [place], [address] Phone: [phone]   Fax:  [phone]  Physical Therapy Treatment  Patient Details  Name: [name] MRN: [id number] Date of Birth: 02-May-1951 Referring Provider (PT): Mardelle Matte   Encounter Date: 05/16/2021   PT End of Session - 05/16/21 1604     Visit Number 12    Date for PT Re-Evaluation 05/22/21    Authorization Type [employer]    PT Start Time 1530    PT Stop Time 1615    PT Time Calculation (min) 45 min    Activity Tolerance Patient tolerated treatment well    Behavior During Therapy WFL for tasks assessed/performed             Past Medical History:  Diagnosis Date   Allergy    Anxiety    Arthritis    back & knee   Asthma     mild per pt shows up with resp illness   Colon polyps    Constipation    Diabetes mellitus without complication (HCC)    Gallstones    Gastric polyps    Gastroparesis    GERD (gastroesophageal reflux disease)    Headache    sinus headaches and migraines at times   Heart murmur    History of migraine headaches    HTN (hypertension)    Hyperlipidemia    IBS (irritable bowel syndrome)    Joint pain    Lumbar disc disease    PONV (postoperative nausea and vomiting)    S/P aortic valve replacement with bioprosthetic valve 08/23/2016   25 mm Edwards Intuity rapid-deployment bovine pericardial tissue valve via partial upper mini sternotomy   Sleep apnea    CPAP   TIA (transient ischemic attack)    TIA (transient ischemic attack)    hx of per pt    Past Surgical History:  Procedure Laterality Date   ABDOMINAL HYSTERECTOMY     AORTIC VALVE REPLACEMENT N/A 08/23/2016   Procedure: AORTIC VALVE REPLACEMENT (AVR) - using partial Upper Sternotomy- 60m Edwards Intuity Aortic Valve used;  Surgeon: CRexene Alberts MD;  [facility]  Service: Open Heart Surgery;  Laterality: N/A;   BLADDER SUSPENSION  2007   BREAST ENHANCEMENT  SURGERY  1980   CARPAL TUNNEL RELEASE Bilateral    COLONOSCOPY     DORSAL COMPARTMENT RELEASE Right 09/15/2014   Procedure: RIGHT WRIST DEQUERVAINS RELEASE ;  Surgeon: DKathryne Hitch MD;  [facility]  Service: Orthopedics;  Laterality: Right;   ESOPHAGOGASTRODUODENOSCOPY     EYE SURGERY     cataract surgery   KNEE ARTHROSCOPY  04/15/2012   Procedure: ARTHROSCOPY KNEE;  Surgeon: DNinetta Lights MD;  [facility]  Service: Orthopedics;  Laterality: Right;   LAPAROSCOPIC CHOLECYSTECTOMY     LASIK     OOPHORECTOMY     PALATE TO GINGIVA GRAFT  2017   REVERSE SHOULDER ARTHROPLASTY Left 02/13/2021   Procedure: REVERSE SHOULDER ARTHROPLASTY;  Surgeon: LMarchia Bond MD;  [facility];  Service: Orthopedics;  Laterality: Left;   RIGHT/LEFT HEART CATH AND CORONARY ANGIOGRAPHY N/A 08/02/2016   Procedure: Right/Left Heart Cath and Coronary Angiography;  Surgeon: MSherren Mocha MD;  [facility];  Service: Cardiovascular;  Laterality: N/A;   ROTATOR CUFF REPAIR Left    TEE WITHOUT CARDIOVERSION N/A 08/23/2016   Procedure: TRANSESOPHAGEAL ECHOCARDIOGRAM (TEE);  Surgeon: CRexene Alberts MD;  [facility]  Service:  Open Heart Surgery;  Laterality: N/A;   TOTAL KNEE ARTHROPLASTY  04/15/2012   Procedure: TOTAL KNEE ARTHROPLASTY;  Surgeon: [name], MD;  Location: [facility];  Service: Orthopedics;  Laterality: Right;   TRIGGER FINGER RELEASE Right 04/20/2015   Procedure: RIGHT TRIGGER FINGER RELEASE (TENDON SHEALTH INCISION) ;  Surgeon: [name], MD;  Location: [facility];  Service: Orthopedics;  Laterality: Right;    There were no vitals filed for this visit.   Subjective Assessment - 05/16/21 1530     Subjective I am doing okay, a little sore    Currently in Pain? Yes    Pain Score 3     Pain Location Shoulder    Pain Orientation Left    Pain Descriptors / Indicators Sore                [facility] PT Assessment -  05/16/21 0001       PROM   Left Shoulder Flexion 133 Degrees    Left Shoulder ABduction 95 Degrees    Left Shoulder External Rotation 40 Degrees                           [facility] Adult PT Treatment/Exercise - 05/16/21 0001       Shoulder Exercises: Seated   External Rotation Left;15 reps    External Rotation Weight (lbs) 1#    External Rotation Limitations 90 degrees abduction      Shoulder Exercises: Standing   Other Standing Exercises cabinet reaching no weight and 1#      Shoulder Exercises: ROM/Strengthening   UBE (Upper Arm Bike) L2 x3 min each    Nustep L5 x 5 min    Wall Wash flexion, CW/CCW, and finger ladder flexion, scaption    "W" Arms with assist    Other ROM/Strengthening Exercises AAROM flex, Ext, IR up back x10      Vasopneumatic   Number Minutes Vasopneumatic  10 minutes    Vasopnuematic Location  Shoulder    Vasopneumatic Pressure Medium    Vasopneumatic Temperature  34      Manual Therapy   Manual Therapy Soft tissue mobilization;Passive ROM    Soft tissue mobilization to the left upper trap, the left upper arm and the teres area    Passive ROM PROM to end range a lot of cues to relax and when doing flexion as she tends to wing out                     PT Education - 05/16/21 1606     Education Details shoulder flexion in mirror with cues to not elevate shoulder and try to maintain true flexion, tends to go out into scaption    Person(s) Educated Patient    Methods Explanation;Demonstration;Tactile cues;Verbal cues    Comprehension Verbalized understanding              PT Short Term Goals - 04/16/21 1510       PT SHORT TERM GOAL #1   Title independent with intial HEP    Status Achieved               PT Long Term Goals - 05/16/21 1608       PT LONG TERM GOAL #1   Title decrease pain 50%    Status Partially Met      PT LONG TERM GOAL #2   Title returns safely to the gym  Status On-going      PT  LONG TERM GOAL #3   Title increase AROM of the left shoulder to 120 degrees flexion in standing    Status On-going                   Plan - 05/16/21 1607     Clinical Impression Statement Patient tolerates the exerciss and the pROM, looked at her motions and really asked her to do int he mirror to avoid shoulder shrug and scaption, I think this is contributing to her upper trap pain and tightness.  She verbalized understanding.  The motion is slowly improving but need to work on some strength above the chest level    PT Next Visit Plan continue to progress protocol, try to gain functional ROM and strength    Consulted and Agree with Plan of Care Patient             Patient will benefit from skilled therapeutic intervention in order to improve the following deficits and impairments:  Decreased range of motion, Impaired UE functional use, Increased muscle spasms, Pain, Improper body mechanics, Postural dysfunction, Increased edema, Decreased strength, Decreased mobility  Visit Diagnosis: Localized edema  S/P reverse total shoulder arthroplasty, left  Acute pain of left shoulder  Stiffness of left shoulder, not elsewhere classified     Problem List Patient Active Problem List   Diagnosis Date Noted   S/P reverse total shoulder arthroplasty, left 02/13/2021   Idiopathic hyperphosphatasia 08/03/2019   Vitamin D deficiency 06/04/2018   Hyponatremia 02/17/2018   History of CVA in adulthood 08/13/2017   Blurry vision 08/13/2017   Hypertension associated with diabetes ([place]) 01/02/2017   S/P minimally invasive aortic valve replacement with bioprosthetic valve 74/05/8785   Diastolic dysfunction    Osteopenia 05/27/2016   OSA (obstructive sleep apnea) 02/27/2016   Class 1 obesity with serious comorbidity and body mass index (BMI) of 32.0 to 32.9 in adult 10/23/2015   Bruxism 10/23/2015   Elevated alkaline phosphatase level 05/16/2015   Atypical chest pain 11/10/2014    Acne 05/12/2014   Diabetes mellitus ([place]) 05/12/2014   GERD (gastroesophageal reflux disease) 03/10/2014   Generalized anxiety disorder 03/10/2014   Migraines 03/10/2014   EUSTACHIAN TUBE DYSFUNCTION, CHRONIC 06/14/2010   Irritable bowel syndrome 12/15/2008   Low back pain 08/11/2008   Hyperlipidemia associated with type 2 diabetes mellitus ([place]) 02/09/2008   Allergic rhinitis 02/12/2007   Asthma 02/12/2007    Sumner Boast, PT 05/16/2021, 4:09 PM  [facility]. [place], [place], [address] Phone: [phone]   Fax:  [phone]  Name: MAURICE RAMSEUR MRN: [id number] Date of Birth: [birth year]

## 2021-05-21 ENCOUNTER — Encounter: Payer: Self-pay | Admitting: Physical Therapy

## 2021-05-21 ENCOUNTER — Ambulatory Visit: Payer: Medicare PPO | Admitting: Physical Therapy

## 2021-05-21 ENCOUNTER — Other Ambulatory Visit: Payer: Self-pay

## 2021-05-21 DIAGNOSIS — R600 Localized edema: Secondary | ICD-10-CM

## 2021-05-21 DIAGNOSIS — M25612 Stiffness of left shoulder, not elsewhere classified: Secondary | ICD-10-CM

## 2021-05-21 DIAGNOSIS — M25512 Pain in left shoulder: Secondary | ICD-10-CM

## 2021-05-21 DIAGNOSIS — Z96612 Presence of left artificial shoulder joint: Secondary | ICD-10-CM

## 2021-05-21 NOTE — Therapy (Signed)
[facility] [place], [place], [address] Phone: [phone]   Fax:  [phone]  Physical Therapy Treatment  Patient Details  Name: [name] MRN: [id number] Date of Birth: [birth year] Referring Provider (PT): Mardelle Matte   Encounter Date: 05/21/2021   PT End of Session - 05/21/21 1607     Visit Number 13    Date for PT Re-Evaluation 05/22/21    Authorization Type [employer]    PT Start Time 3009    PT Stop Time 1620    PT Time Calculation (min) 55 min    Activity Tolerance Patient tolerated treatment well    Behavior During Therapy WFL for tasks assessed/performed             Past Medical History:  Diagnosis Date   Allergy    Anxiety    Arthritis    back & knee   Asthma     mild per pt shows up with resp illness   Colon polyps    Constipation    Diabetes mellitus without complication (HCC)    Gallstones    Gastric polyps    Gastroparesis    GERD (gastroesophageal reflux disease)    Headache    sinus headaches and migraines at times   Heart murmur    History of migraine headaches    HTN (hypertension)    Hyperlipidemia    IBS (irritable bowel syndrome)    Joint pain    Lumbar disc disease    PONV (postoperative nausea and vomiting)    S/P aortic valve replacement with bioprosthetic valve 08/23/2016   25 mm Edwards Intuity rapid-deployment bovine pericardial tissue valve via partial upper mini sternotomy   Sleep apnea    CPAP   TIA (transient ischemic attack)    TIA (transient ischemic attack)    hx of per pt    Past Surgical History:  Procedure Laterality Date   ABDOMINAL HYSTERECTOMY     AORTIC VALVE REPLACEMENT N/A 08/23/2016   Procedure: AORTIC VALVE REPLACEMENT (AVR) - using partial Upper Sternotomy- 63m Edwards Intuity Aortic Valve used;  Surgeon: CRexene Alberts MD;  [facility]  Service: Open Heart Surgery;  Laterality: N/A;   BLADDER SUSPENSION  2007   BREAST ENHANCEMENT  SURGERY  1980   CARPAL TUNNEL RELEASE Bilateral    COLONOSCOPY     DORSAL COMPARTMENT RELEASE Right 09/15/2014   Procedure: RIGHT WRIST DEQUERVAINS RELEASE ;  Surgeon: DKathryne Hitch MD;  [facility]  Service: Orthopedics;  Laterality: Right;   ESOPHAGOGASTRODUODENOSCOPY     EYE SURGERY     cataract surgery   KNEE ARTHROSCOPY  04/15/2012   Procedure: ARTHROSCOPY KNEE;  Surgeon: DNinetta Lights MD;  [facility]  Service: Orthopedics;  Laterality: Right;   LAPAROSCOPIC CHOLECYSTECTOMY     LASIK     OOPHORECTOMY     PALATE TO GINGIVA GRAFT  2017   REVERSE SHOULDER ARTHROPLASTY Left 02/13/2021   Procedure: REVERSE SHOULDER ARTHROPLASTY;  Surgeon: LMarchia Bond MD;  [facility];  Service: Orthopedics;  Laterality: Left;   RIGHT/LEFT HEART CATH AND CORONARY ANGIOGRAPHY N/A 08/02/2016   Procedure: Right/Left Heart Cath and Coronary Angiography;  Surgeon: MSherren Mocha MD;  [facility];  Service: Cardiovascular;  Laterality: N/A;   ROTATOR CUFF REPAIR Left    TEE WITHOUT CARDIOVERSION N/A 08/23/2016   Procedure: TRANSESOPHAGEAL ECHOCARDIOGRAM (TEE);  Surgeon: CRexene Alberts MD;  [facility]  Service:  Open Heart Surgery;  Laterality: N/A;   TOTAL KNEE ARTHROPLASTY  04/15/2012   Procedure: TOTAL KNEE ARTHROPLASTY;  Surgeon: [name], MD;  Location: [facility];  Service: Orthopedics;  Laterality: Right;   TRIGGER FINGER RELEASE Right 04/20/2015   Procedure: RIGHT TRIGGER FINGER RELEASE (TENDON SHEALTH INCISION) ;  Surgeon: [name], MD;  Location: [facility];  Service: Orthopedics;  Laterality: Right;    There were no vitals filed for this visit.   Subjective Assessment - 05/21/21 1529     Subjective I still struggle with my motions, see the MD this Friday, her insurance authorization runs out today.  I am requesting more visits today    Currently in Pain? Yes    Pain Score 3     Pain Location Shoulder    Pain  Orientation Left    Pain Descriptors / Indicators Sore;Spasm;Tightness    Aggravating Factors  reaching up and out                [facility] PT Assessment - 05/21/21 0001       AROM   Left Shoulder Flexion 112 Degrees    Left Shoulder ABduction 85 Degrees    Left Shoulder Internal Rotation 55 Degrees    Left Shoulder External Rotation 40 Degrees   at 50 degrees abduction     PROM   Left Shoulder Flexion 140 Degrees    Left Shoulder ABduction 102 Degrees    Left Shoulder Internal Rotation 40 Degrees    Left Shoulder External Rotation 52 Degrees                           [facility] Adult PT Treatment/Exercise - 05/21/21 0001       Shoulder Exercises: Standing   Flexion AAROM;20 reps      Shoulder Exercises: ROM/Strengthening   UBE (Upper Arm Bike) L2 x3 min each    Nustep L5 x 5 min    Lat Pull 1.5 plate;20 reps    Cybex Row 1.5 plate;20 reps    Wall Wash flexion, CW/CCW, and finger ladder flexion, scaption      Vasopneumatic   Number Minutes Vasopneumatic  10 minutes    Vasopnuematic Location  Shoulder    Vasopneumatic Pressure Medium    Vasopneumatic Temperature  34      Manual Therapy   Manual Therapy Soft tissue mobilization;Passive ROM    Soft tissue mobilization to the left upper trap, the left upper arm and the teres area    Passive ROM PROM to end range a lot of cues to relax and when doing flexion as she tends to wing out                       PT Short Term Goals - 04/16/21 1510       PT SHORT TERM GOAL #1   Title independent with intial HEP    Status Achieved               PT Long Term Goals - 05/21/21 1610       PT LONG TERM GOAL #1   Title decrease pain 50%    Status Partially Met      PT LONG TERM GOAL #2   Title returns safely to the gym    Status Partially Met      PT LONG TERM GOAL #3   Title increase AROM of the left  shoulder to 120 degrees flexion in standing    Status Partially Met      PT LONG TERM  GOAL #4   Title report no difficulty dressing or doing hair    Status Partially Met                   Plan - 05/21/21 1607     Clinical Impression Statement I am really trying to push more normal ROM she has a lot of compensation with the upper trap.  Having her try some flexion in the mirrior and avoiding the upper trap activation.  She still has significant knots and tenderness here with palpation, her ROM passively and actively are improving.  She is still reporting pain levels of a 3/10 with ADL's and higher with exercises and PROM.    PT Next Visit Plan will request more visits and continue to follow MD protocol, pushing ROM and functional strength    Consulted and Agree with Plan of Care Patient             Patient will benefit from skilled therapeutic intervention in order to improve the following deficits and impairments:  Decreased range of motion, Impaired UE functional use, Increased muscle spasms, Pain, Improper body mechanics, Postural dysfunction, Increased edema, Decreased strength, Decreased mobility  Visit Diagnosis: Localized edema  S/P reverse total shoulder arthroplasty, left  Acute pain of left shoulder  Stiffness of left shoulder, not elsewhere classified     Problem List Patient Active Problem List   Diagnosis Date Noted   S/P reverse total shoulder arthroplasty, left 02/13/2021   Idiopathic hyperphosphatasia 08/03/2019   Vitamin D deficiency 06/04/2018   Hyponatremia 02/17/2018   History of CVA in adulthood 08/13/2017   Blurry vision 08/13/2017   Hypertension associated with diabetes ([place]) 01/02/2017   S/P minimally invasive aortic valve replacement with bioprosthetic valve 73/22/0254   Diastolic dysfunction    Osteopenia 05/27/2016   OSA (obstructive sleep apnea) 02/27/2016   Class 1 obesity with serious comorbidity and body mass index (BMI) of 32.0 to 32.9 in adult 10/23/2015   Bruxism 10/23/2015   Elevated alkaline phosphatase level  05/16/2015   Atypical chest pain 11/10/2014   Acne 05/12/2014   Diabetes mellitus ([place]) 05/12/2014   GERD (gastroesophageal reflux disease) 03/10/2014   Generalized anxiety disorder 03/10/2014   Migraines 03/10/2014   EUSTACHIAN TUBE DYSFUNCTION, CHRONIC 06/14/2010   Irritable bowel syndrome 12/15/2008   Low back pain 08/11/2008   Hyperlipidemia associated with type 2 diabetes mellitus ([place]) 02/09/2008   Allergic rhinitis 02/12/2007   Asthma 02/12/2007    Sumner Boast, PT 05/21/2021, 4:13 PM  [facility] [address] [place], [place], [address] Phone: [phone]   Fax:  [phone]  Name: [name] MRN: [id number] Date of Birth: [birth year]

## 2021-05-25 DIAGNOSIS — S42202D Unspecified fracture of upper end of left humerus, subsequent encounter for fracture with routine healing: Secondary | ICD-10-CM | POA: Diagnosis not present

## 2021-05-29 ENCOUNTER — Ambulatory Visit: Payer: Medicare PPO | Attending: Orthopedic Surgery | Admitting: Physical Therapy

## 2021-05-29 ENCOUNTER — Other Ambulatory Visit: Payer: Self-pay

## 2021-05-29 ENCOUNTER — Encounter: Payer: Self-pay | Admitting: Physical Therapy

## 2021-05-29 DIAGNOSIS — Z96612 Presence of left artificial shoulder joint: Secondary | ICD-10-CM | POA: Diagnosis not present

## 2021-05-29 DIAGNOSIS — M25612 Stiffness of left shoulder, not elsewhere classified: Secondary | ICD-10-CM | POA: Diagnosis not present

## 2021-05-29 DIAGNOSIS — R600 Localized edema: Secondary | ICD-10-CM

## 2021-05-29 DIAGNOSIS — M25512 Pain in left shoulder: Secondary | ICD-10-CM | POA: Diagnosis not present

## 2021-05-29 NOTE — Therapy (Signed)
[facility]. [place], [place], [address] Phone: [phone]   Fax:  [phone]  Physical Therapy Treatment  Patient Details  Name: NIEVES CHAPA MRN: [id number] Date of Birth: [birth year] Referring Provider (PT): Mardelle Matte   Encounter Date: 05/29/2021   PT End of Session - 05/29/21 1524     Visit Number 14    Number of Visits 25    Date for PT Re-Evaluation 07/13/21    Authorization Type [employer]    PT Start Time 1440    PT Stop Time 0962    PT Time Calculation (min) 58 min    Activity Tolerance Patient tolerated treatment well    Behavior During Therapy WFL for tasks assessed/performed             Past Medical History:  Diagnosis Date   Allergy    Anxiety    Arthritis    back & knee   Asthma     mild per pt shows up with resp illness   Colon polyps    Constipation    Diabetes mellitus without complication (HCC)    Gallstones    Gastric polyps    Gastroparesis    GERD (gastroesophageal reflux disease)    Headache    sinus headaches and migraines at times   Heart murmur    History of migraine headaches    HTN (hypertension)    Hyperlipidemia    IBS (irritable bowel syndrome)    Joint pain    Lumbar disc disease    PONV (postoperative nausea and vomiting)    S/P aortic valve replacement with bioprosthetic valve 08/23/2016   25 mm Edwards Intuity rapid-deployment bovine pericardial tissue valve via partial upper mini sternotomy   Sleep apnea    CPAP   TIA (transient ischemic attack)    TIA (transient ischemic attack)    hx of per pt    Past Surgical History:  Procedure Laterality Date   ABDOMINAL HYSTERECTOMY     AORTIC VALVE REPLACEMENT N/A 08/23/2016   Procedure: AORTIC VALVE REPLACEMENT (AVR) - using partial Upper Sternotomy- 59m Edwards Intuity Aortic Valve used;  Surgeon: CRexene Alberts MD;  [facility]  Service: Open Heart Surgery;  Laterality: N/A;   BLADDER SUSPENSION   2007   BREAST ENHANCEMENT SURGERY  1980   CARPAL TUNNEL RELEASE Bilateral    COLONOSCOPY     DORSAL COMPARTMENT RELEASE Right 09/15/2014   Procedure: RIGHT WRIST DEQUERVAINS RELEASE ;  Surgeon: DKathryne Hitch MD;  [facility]  Service: Orthopedics;  Laterality: Right;   ESOPHAGOGASTRODUODENOSCOPY     EYE SURGERY     cataract surgery   KNEE ARTHROSCOPY  04/15/2012   Procedure: ARTHROSCOPY KNEE;  Surgeon: DNinetta Lights MD;  [facility]  Service: Orthopedics;  Laterality: Right;   LAPAROSCOPIC CHOLECYSTECTOMY     LASIK     OOPHORECTOMY     PALATE TO GINGIVA GRAFT  2017   REVERSE SHOULDER ARTHROPLASTY Left 02/13/2021   Procedure: REVERSE SHOULDER ARTHROPLASTY;  Surgeon: LMarchia Bond MD;  [facility];  Service: Orthopedics;  Laterality: Left;   RIGHT/LEFT HEART CATH AND CORONARY ANGIOGRAPHY N/A 08/02/2016   Procedure: Right/Left Heart Cath and Coronary Angiography;  Surgeon: MSherren Mocha MD;  [facility];  Service: Cardiovascular;  Laterality: N/A;   ROTATOR CUFF REPAIR Left    TEE WITHOUT CARDIOVERSION N/A 08/23/2016   Procedure: TRANSESOPHAGEAL ECHOCARDIOGRAM (TEE);  Surgeon: CRexene Alberts  MD;  [facility];  Service: Open Heart Surgery;  Laterality: N/A;   TOTAL KNEE ARTHROPLASTY  04/15/2012   Procedure: TOTAL KNEE ARTHROPLASTY;  Surgeon: [name], MD;  Location: [facility];  Service: Orthopedics;  Laterality: Right;   TRIGGER FINGER RELEASE Right 04/20/2015   Procedure: RIGHT TRIGGER FINGER RELEASE (TENDON SHEALTH INCISION) ;  Surgeon: [name], MD;  Location: [facility];  Service: Orthopedics;  Laterality: Right;    There were no vitals filed for this visit.   Subjective Assessment - 05/29/21 1444     Subjective Saw MD, she reports that he was pleased with how she was doing, reports achey with the cool wet weather    Currently in Pain? Yes    Pain Score 3     Pain Location Shoulder    Pain  Orientation Left    Pain Descriptors / Indicators Tightness;Sore                               [facility] Adult PT Treatment/Exercise - 05/29/21 0001       Shoulder Exercises: Standing   Extension Strengthening;Both;20 reps;Theraband    Theraband Level (Shoulder Extension) Level 2 (Red)    Row Strengthening;Both;Theraband;20 reps    Theraband Level (Shoulder Row) Level 2 (Red)      Shoulder Exercises: ROM/Strengthening   UBE (Upper Arm Bike) L2 x3 min each    Nustep L5 x 5 min    Lat Pull 1.5 plate;20 reps    Cybex Row 1.5 plate;20 reps    Wall Wash flexion, CW/CCW, and finger ladder flexion, scaption    "W" Arms with assist    Other ROM/Strengthening Exercises LUE 2 level cabinet reaches  x10 flex &  abd    Other ROM/Strengthening Exercises AAROM flex, Ext, IR up back x10      Vasopneumatic   Number Minutes Vasopneumatic  10 minutes    Vasopnuematic Location  Shoulder    Vasopneumatic Pressure Medium    Vasopneumatic Temperature  34      Manual Therapy   Manual Therapy Soft tissue mobilization;Passive ROM    Soft tissue mobilization to the left upper trap, the left upper arm and the teres area    Passive ROM PROM to end range a lot of cues to relax and when doing flexion as she tends to wing out                       PT Short Term Goals - 04/16/21 1510       PT SHORT TERM GOAL #1   Title independent with intial HEP    Status Achieved               PT Long Term Goals - 05/21/21 1610       PT LONG TERM GOAL #1   Title decrease pain 50%    Status Partially Met      PT LONG TERM GOAL #2   Title returns safely to the gym    Status Partially Met      PT LONG TERM GOAL #3   Title increase AROM of the left shoulder to 120 degrees flexion in standing    Status Partially Met      PT LONG TERM GOAL #4   Title report no difficulty dressing or doing hair    Status Partially Met  Plan - 05/29/21 1525      Clinical Impression Statement Coninues to need cues verbal and tacitle as well as times visual to stop or limit the upper trap compensation.  i feel like passively she lets me take her, she is very tight neural tension in the left hand/wrist.  Really working on the function and less compensation    PT Next Visit Plan got more visits 1 of 12 today    Consulted and Agree with Plan of Care Patient             Patient will benefit from skilled therapeutic intervention in order to improve the following deficits and impairments:  Decreased range of motion, Impaired UE functional use, Increased muscle spasms, Pain, Improper body mechanics, Postural dysfunction, Increased edema, Decreased strength, Decreased mobility  Visit Diagnosis: Localized edema  S/P reverse total shoulder arthroplasty, left  Acute pain of left shoulder  Stiffness of left shoulder, not elsewhere classified     Problem List Patient Active Problem List   Diagnosis Date Noted   S/P reverse total shoulder arthroplasty, left 02/13/2021   Idiopathic hyperphosphatasia 08/03/2019   Vitamin D deficiency 06/04/2018   Hyponatremia 02/17/2018   History of CVA in adulthood 08/13/2017   Blurry vision 08/13/2017   Hypertension associated with diabetes ([place]) 01/02/2017   S/P minimally invasive aortic valve replacement with bioprosthetic valve 15/52/0802   Diastolic dysfunction    Osteopenia 05/27/2016   OSA (obstructive sleep apnea) 02/27/2016   Class 1 obesity with serious comorbidity and body mass index (BMI) of 32.0 to 32.9 in adult 10/23/2015   Bruxism 10/23/2015   Elevated alkaline phosphatase level 05/16/2015   Atypical chest pain 11/10/2014   Acne 05/12/2014   Diabetes mellitus ([place]) 05/12/2014   GERD (gastroesophageal reflux disease) 03/10/2014   Generalized anxiety disorder 03/10/2014   Migraines 03/10/2014   EUSTACHIAN TUBE DYSFUNCTION, CHRONIC 06/14/2010   Irritable bowel syndrome 12/15/2008   Low back  pain 08/11/2008   Hyperlipidemia associated with type 2 diabetes mellitus ([place]) 02/09/2008   Allergic rhinitis 02/12/2007   Asthma 02/12/2007    Sumner Boast, PT 05/29/2021, 3:27 PM  [facility]. [place], [place], [address] Phone: [phone]   Fax:  [phone]  Name: STASHA NARAINE MRN: [id number] Date of Birth: 11-Jan-1951

## 2021-05-31 ENCOUNTER — Encounter: Payer: Self-pay | Admitting: Physical Therapy

## 2021-05-31 ENCOUNTER — Ambulatory Visit: Payer: Medicare PPO | Admitting: Physical Therapy

## 2021-05-31 ENCOUNTER — Other Ambulatory Visit: Payer: Self-pay

## 2021-05-31 DIAGNOSIS — R600 Localized edema: Secondary | ICD-10-CM

## 2021-05-31 DIAGNOSIS — Z96612 Presence of left artificial shoulder joint: Secondary | ICD-10-CM | POA: Diagnosis not present

## 2021-05-31 DIAGNOSIS — M25612 Stiffness of left shoulder, not elsewhere classified: Secondary | ICD-10-CM

## 2021-05-31 DIAGNOSIS — M25512 Pain in left shoulder: Secondary | ICD-10-CM | POA: Diagnosis not present

## 2021-05-31 NOTE — Therapy (Signed)
[facility]. [place], [place], [address] Phone: [phone]   Fax:  [phone]  Physical Therapy Treatment  Patient Details  Name: [name] MRN: [id number] Date of Birth: 19-Apr-1951 Referring Provider (PT): Mardelle Matte   Encounter Date: 05/31/2021   PT End of Session - 05/31/21 1559     Visit Number 15    Date for PT Re-Evaluation 07/13/21    Authorization Type [employer]    PT Start Time 3794    PT Stop Time 1609    PT Time Calculation (min) 54 min    Activity Tolerance Patient tolerated treatment well    Behavior During Therapy WFL for tasks assessed/performed             Past Medical History:  Diagnosis Date   Allergy    Anxiety    Arthritis    back & knee   Asthma     mild per pt shows up with resp illness   Colon polyps    Constipation    Diabetes mellitus without complication (HCC)    Gallstones    Gastric polyps    Gastroparesis    GERD (gastroesophageal reflux disease)    Headache    sinus headaches and migraines at times   Heart murmur    History of migraine headaches    HTN (hypertension)    Hyperlipidemia    IBS (irritable bowel syndrome)    Joint pain    Lumbar disc disease    PONV (postoperative nausea and vomiting)    S/P aortic valve replacement with bioprosthetic valve 08/23/2016   25 mm Edwards Intuity rapid-deployment bovine pericardial tissue valve via partial upper mini sternotomy   Sleep apnea    CPAP   TIA (transient ischemic attack)    TIA (transient ischemic attack)    hx of per pt    Past Surgical History:  Procedure Laterality Date   ABDOMINAL HYSTERECTOMY     AORTIC VALVE REPLACEMENT N/A 08/23/2016   Procedure: AORTIC VALVE REPLACEMENT (AVR) - using partial Upper Sternotomy- 50m Edwards Intuity Aortic Valve used;  Surgeon: CRexene Alberts MD;  [facility]  Service: Open Heart Surgery;  Laterality: N/A;   BLADDER SUSPENSION  2007   BREAST ENHANCEMENT  SURGERY  1980   CARPAL TUNNEL RELEASE Bilateral    COLONOSCOPY     DORSAL COMPARTMENT RELEASE Right 09/15/2014   Procedure: RIGHT WRIST DEQUERVAINS RELEASE ;  Surgeon: DKathryne Hitch MD;  [facility]  Service: Orthopedics;  Laterality: Right;   ESOPHAGOGASTRODUODENOSCOPY     EYE SURGERY     cataract surgery   KNEE ARTHROSCOPY  04/15/2012   Procedure: ARTHROSCOPY KNEE;  Surgeon: DNinetta Lights MD;  [facility]  Service: Orthopedics;  Laterality: Right;   LAPAROSCOPIC CHOLECYSTECTOMY     LASIK     OOPHORECTOMY     PALATE TO GINGIVA GRAFT  2017   REVERSE SHOULDER ARTHROPLASTY Left 02/13/2021   Procedure: REVERSE SHOULDER ARTHROPLASTY;  Surgeon: LMarchia Bond MD;  [facility];  Service: Orthopedics;  Laterality: Left;   RIGHT/LEFT HEART CATH AND CORONARY ANGIOGRAPHY N/A 08/02/2016   Procedure: Right/Left Heart Cath and Coronary Angiography;  Surgeon: MSherren Mocha MD;  [facility];  Service: Cardiovascular;  Laterality: N/A;   ROTATOR CUFF REPAIR Left    TEE WITHOUT CARDIOVERSION N/A 08/23/2016   Procedure: TRANSESOPHAGEAL ECHOCARDIOGRAM (TEE);  Surgeon: CRexene Alberts MD;  [facility]  Service:  Open Heart Surgery;  Laterality: N/A;   TOTAL KNEE ARTHROPLASTY  04/15/2012   Procedure: TOTAL KNEE ARTHROPLASTY;  Surgeon: [name], MD;  Location: [facility];  Service: Orthopedics;  Laterality: Right;   TRIGGER FINGER RELEASE Right 04/20/2015   Procedure: RIGHT TRIGGER FINGER RELEASE (TENDON SHEALTH INCISION) ;  Surgeon: [name], MD;  Location: [facility];  Service: Orthopedics;  Laterality: Right;    There were no vitals filed for this visit.   Subjective Assessment - 05/31/21 1514     Subjective Doing pretty good    Currently in Pain? No/denies                               [facility] Adult PT Treatment/Exercise - 05/31/21 0001       Shoulder Exercises: Standing   Flexion  Strengthening;Both;10 reps;5 reps;Weights    Shoulder Flexion Weight (lbs) 1    ABduction Strengthening;Both;10 reps;5 reps;Weights    Shoulder ABduction Weight (lbs) 1    Extension Strengthening;Both;20 reps;Theraband    Theraband Level (Shoulder Extension) Level 2 (Red)      Shoulder Exercises: ROM/Strengthening   UBE (Upper Arm Bike) L2 x3 min each    Nustep L5 x 5 min    Lat Pull 1.5 plate;20 reps    Cybex Row 1.5 plate;20 reps    Wall Wash flexion, CW/CCW, and finger ladder flexion, scaption    Other ROM/Strengthening Exercises LUE 2 level cabinet reaches  x10 flex &  abd    Other ROM/Strengthening Exercises AAROM flex, Ext, IR up back x10      Vasopneumatic   Number Minutes Vasopneumatic  10 minutes    Vasopnuematic Location  Shoulder    Vasopneumatic Pressure Medium    Vasopneumatic Temperature  34      Manual Therapy   Manual Therapy Soft tissue mobilization;Passive ROM    Soft tissue mobilization to the left upper trap, the left upper arm and the teres area    Passive ROM PROM to end range a lot of cues to relax and when doing flexion as she tends to wing out                       PT Short Term Goals - 04/16/21 1510       PT SHORT TERM GOAL #1   Title independent with intial HEP    Status Achieved               PT Long Term Goals - 05/21/21 1610       PT LONG TERM GOAL #1   Title decrease pain 50%    Status Partially Met      PT LONG TERM GOAL #2   Title returns safely to the gym    Status Partially Met      PT LONG TERM GOAL #3   Title increase AROM of the left shoulder to 120 degrees flexion in standing    Status Partially Met      PT LONG TERM GOAL #4   Title report no difficulty dressing or doing hair    Status Partially Met                   Plan - 05/31/21 1559     Clinical Impression Statement Strong focus place on prevent compensation with all LUE movements. increase UT utilization with active flexion and  abduction. Cue for  scapular protraction needed with standing shoulder extensions. Cue to prevent postural compensation needed with PROM.    Stability/Clinical Decision Making Evolving/Moderate complexity    Rehab Potential Good    PT Frequency 2x / week    PT Duration 12 weeks    PT Treatment/Interventions ADLs/Self Care Home Management;Cryotherapy;Electrical Stimulation;Therapeutic activities;Therapeutic exercise;Neuromuscular re-education;Manual techniques;Patient/family education;Cognitive remediation;Vasopneumatic Device    PT Next Visit Plan got more visits 1 of 12 today             Patient will benefit from skilled therapeutic intervention in order to improve the following deficits and impairments:  Decreased range of motion, Impaired UE functional use, Increased muscle spasms, Pain, Improper body mechanics, Postural dysfunction, Increased edema, Decreased strength, Decreased mobility  Visit Diagnosis: S/P reverse total shoulder arthroplasty, left  Acute pain of left shoulder  Localized edema  Stiffness of left shoulder, not elsewhere classified     Problem List Patient Active Problem List   Diagnosis Date Noted   S/P reverse total shoulder arthroplasty, left 02/13/2021   Idiopathic hyperphosphatasia 08/03/2019   Vitamin D deficiency 06/04/2018   Hyponatremia 02/17/2018   History of CVA in adulthood 08/13/2017   Blurry vision 08/13/2017   Hypertension associated with diabetes ([place]) 01/02/2017   S/P minimally invasive aortic valve replacement with bioprosthetic valve 40/45/9136   Diastolic dysfunction    Osteopenia 05/27/2016   OSA (obstructive sleep apnea) 02/27/2016   Class 1 obesity with serious comorbidity and body mass index (BMI) of 32.0 to 32.9 in adult 10/23/2015   Bruxism 10/23/2015   Elevated alkaline phosphatase level 05/16/2015   Atypical chest pain 11/10/2014   Acne 05/12/2014   Diabetes mellitus ([place]) 05/12/2014   GERD (gastroesophageal reflux  disease) 03/10/2014   Generalized anxiety disorder 03/10/2014   Migraines 03/10/2014   EUSTACHIAN TUBE DYSFUNCTION, CHRONIC 06/14/2010   Irritable bowel syndrome 12/15/2008   Low back pain 08/11/2008   Hyperlipidemia associated with type 2 diabetes mellitus ([place]) 02/09/2008   Allergic rhinitis 02/12/2007   Asthma 02/12/2007    Scot Jun, PTA 05/31/2021, 4:02 PM  [facility]. [place], [place], [address] Phone: [phone]   Fax:  [phone]  Name: POETRY CERRO MRN: [id number] Date of Birth: Jul 07, 1950

## 2021-06-04 ENCOUNTER — Encounter: Payer: Self-pay | Admitting: Physical Therapy

## 2021-06-04 ENCOUNTER — Other Ambulatory Visit: Payer: Self-pay

## 2021-06-04 ENCOUNTER — Ambulatory Visit: Payer: Medicare PPO | Admitting: Physical Therapy

## 2021-06-04 DIAGNOSIS — R600 Localized edema: Secondary | ICD-10-CM

## 2021-06-04 DIAGNOSIS — M25612 Stiffness of left shoulder, not elsewhere classified: Secondary | ICD-10-CM | POA: Diagnosis not present

## 2021-06-04 DIAGNOSIS — M25512 Pain in left shoulder: Secondary | ICD-10-CM | POA: Diagnosis not present

## 2021-06-04 DIAGNOSIS — Z96612 Presence of left artificial shoulder joint: Secondary | ICD-10-CM

## 2021-06-04 NOTE — Therapy (Signed)
[facility]. [place], [place], [address] Phone: [phone]   Fax:  [phone]  Physical Therapy Treatment  Patient Details  Name: [name] MRN: [id number] Date of Birth: 09/10/1950 Referring Provider (PT): Mardelle Matte   Encounter Date: 06/04/2021   PT End of Session - 06/04/21 1601     Visit Number 16    Date for PT Re-Evaluation 07/13/21    PT Start Time 1515    PT Stop Time 1607    PT Time Calculation (min) 52 min    Activity Tolerance Patient tolerated treatment well    Behavior During Therapy [facility] for tasks assessed/performed             Past Medical History:  Diagnosis Date   Allergy    Anxiety    Arthritis    back & knee   Asthma     mild per pt shows up with resp illness   Colon polyps    Constipation    Diabetes mellitus without complication (HCC)    Gallstones    Gastric polyps    Gastroparesis    GERD (gastroesophageal reflux disease)    Headache    sinus headaches and migraines at times   Heart murmur    History of migraine headaches    HTN (hypertension)    Hyperlipidemia    IBS (irritable bowel syndrome)    Joint pain    Lumbar disc disease    PONV (postoperative nausea and vomiting)    S/P aortic valve replacement with bioprosthetic valve 08/23/2016   25 mm Edwards Intuity rapid-deployment bovine pericardial tissue valve via partial upper mini sternotomy   Sleep apnea    CPAP   TIA (transient ischemic attack)    TIA (transient ischemic attack)    hx of per pt    Past Surgical History:  Procedure Laterality Date   ABDOMINAL HYSTERECTOMY     AORTIC VALVE REPLACEMENT N/A 08/23/2016   Procedure: AORTIC VALVE REPLACEMENT (AVR) - using partial Upper Sternotomy- 68m Edwards Intuity Aortic Valve used;  Surgeon: CRexene Alberts MD;  [facility]  Service: Open Heart Surgery;  Laterality: N/A;   BLADDER SUSPENSION  2007   BREAST ENHANCEMENT SURGERY  1980   CARPAL  TUNNEL RELEASE Bilateral    COLONOSCOPY     DORSAL COMPARTMENT RELEASE Right 09/15/2014   Procedure: RIGHT WRIST DEQUERVAINS RELEASE ;  Surgeon: DKathryne Hitch MD;  [facility]  Service: Orthopedics;  Laterality: Right;   ESOPHAGOGASTRODUODENOSCOPY     EYE SURGERY     cataract surgery   KNEE ARTHROSCOPY  04/15/2012   Procedure: ARTHROSCOPY KNEE;  Surgeon: DNinetta Lights MD;  [facility]  Service: Orthopedics;  Laterality: Right;   LAPAROSCOPIC CHOLECYSTECTOMY     LASIK     OOPHORECTOMY     PALATE TO GINGIVA GRAFT  2017   REVERSE SHOULDER ARTHROPLASTY Left 02/13/2021   Procedure: REVERSE SHOULDER ARTHROPLASTY;  Surgeon: LMarchia Bond MD;  [facility];  Service: Orthopedics;  Laterality: Left;   RIGHT/LEFT HEART CATH AND CORONARY ANGIOGRAPHY N/A 08/02/2016   Procedure: Right/Left Heart Cath and Coronary Angiography;  Surgeon: MSherren Mocha MD;  [facility];  Service: Cardiovascular;  Laterality: N/A;   ROTATOR CUFF REPAIR Left    TEE WITHOUT CARDIOVERSION N/A 08/23/2016   Procedure: TRANSESOPHAGEAL ECHOCARDIOGRAM (TEE);  Surgeon: CRexene Alberts MD;  [facility]  Service: Open Heart Surgery;  Laterality: N/A;  TOTAL KNEE ARTHROPLASTY  04/15/2012   Procedure: TOTAL KNEE ARTHROPLASTY;  Surgeon: [name], MD;  Location: [facility];  Service: Orthopedics;  Laterality: Right;   TRIGGER FINGER RELEASE Right 04/20/2015   Procedure: RIGHT TRIGGER FINGER RELEASE (TENDON SHEALTH INCISION) ;  Surgeon: [name], MD;  Location: [facility];  Service: Orthopedics;  Laterality: Right;    There were no vitals filed for this visit.   Subjective Assessment - 06/04/21 1513     Subjective "Pretty good" Had some pain yesterday but does not know why    Currently in Pain? No/denies                               [facility] Adult PT Treatment/Exercise - 06/04/21 0001       Shoulder Exercises: Standing    Extension Strengthening;Both;20 reps;Weights    Extension Weight (lbs) 5    Row Strengthening;Both;10 reps    Row Weight (lbs) 10      Shoulder Exercises: ROM/Strengthening   UBE (Upper Arm Bike) L2.5  x3 min each    Nustep L5 x 6 min    Lat Pull 1.5 plate;20 reps    Cybex Row 15 reps;1.5 plate   x2   Wall Wash flexion, CW/CCW, and finger ladder flexion, scaption    Other ROM/Strengthening Exercises LUE 2 level cabinet reaches  x10 flex &  abd 1lb      Vasopneumatic   Number Minutes Vasopneumatic  10 minutes    Vasopnuematic Location  Shoulder    Vasopneumatic Pressure Medium    Vasopneumatic Temperature  34      Manual Therapy   Manual Therapy Soft tissue mobilization;Passive ROM    Soft tissue mobilization to the left upper trap, the left upper arm and the teres area    Passive ROM PROM to end range a lot of cues to relax and when doing flexion as she tends to wing out                       PT Short Term Goals - 04/16/21 1510       PT SHORT TERM GOAL #1   Title independent with intial HEP    Status Achieved               PT Long Term Goals - 06/04/21 1603       PT LONG TERM GOAL #1   Title decrease pain 50%    Status Partially Met      PT LONG TERM GOAL #2   Title returns safely to the gym    Status Partially Met      PT LONG TERM GOAL #3   Title increase AROM of the left shoulder to 120 degrees flexion in standing    Status Partially Met                   Plan - 06/04/21 1604     Clinical Impression Statement Pt able to complete all interventions. Increase resistance tolerated with cabinet reaches, standing rows, and shoulder extensions did cause some L shoulder fatigue and shaking. Pt did report a stretch in her lateral L shoulder with Lat pull downs  Tissue density noted with some soreness in the upper R trap with STM.    Stability/Clinical Decision Making Evolving/Moderate complexity    Rehab Potential Good    PT Frequency 2x /  week  PT Duration 12 weeks    PT Treatment/Interventions ADLs/Self Care Home Management;Cryotherapy;Electrical Stimulation;Therapeutic activities;Therapeutic exercise;Neuromuscular re-education;Manual techniques;Patient/family education;Cognitive remediation;Vasopneumatic Device    PT Next Visit Plan L shoulder AROM and strenght             Patient will benefit from skilled therapeutic intervention in order to improve the following deficits and impairments:  Decreased range of motion, Impaired UE functional use, Increased muscle spasms, Pain, Improper body mechanics, Postural dysfunction, Increased edema, Decreased strength, Decreased mobility  Visit Diagnosis: S/P reverse total shoulder arthroplasty, left  Acute pain of left shoulder  Stiffness of left shoulder, not elsewhere classified  Localized edema     Problem List Patient Active Problem List   Diagnosis Date Noted   S/P reverse total shoulder arthroplasty, left 02/13/2021   Idiopathic hyperphosphatasia 08/03/2019   Vitamin D deficiency 06/04/2018   Hyponatremia 02/17/2018   History of CVA in adulthood 08/13/2017   Blurry vision 08/13/2017   Hypertension associated with diabetes ([place]) 01/02/2017   S/P minimally invasive aortic valve replacement with bioprosthetic valve 16/03/9603   Diastolic dysfunction    Osteopenia 05/27/2016   OSA (obstructive sleep apnea) 02/27/2016   Class 1 obesity with serious comorbidity and body mass index (BMI) of 32.0 to 32.9 in adult 10/23/2015   Bruxism 10/23/2015   Elevated alkaline phosphatase level 05/16/2015   Atypical chest pain 11/10/2014   Acne 05/12/2014   Diabetes mellitus ([place]) 05/12/2014   GERD (gastroesophageal reflux disease) 03/10/2014   Generalized anxiety disorder 03/10/2014   Migraines 03/10/2014   EUSTACHIAN TUBE DYSFUNCTION, CHRONIC 06/14/2010   Irritable bowel syndrome 12/15/2008   Low back pain 08/11/2008   Hyperlipidemia associated with type 2 diabetes  mellitus ([place]) 02/09/2008   Allergic rhinitis 02/12/2007   Asthma 02/12/2007    Scot Jun, PTA 06/04/2021, 4:09 PM  [place]. [place], [place], [address] Phone: [phone]   Fax:  [phone]  Name: [name] MRN: [id number] Date of Birth: [birth year]

## 2021-06-05 ENCOUNTER — Encounter (INDEPENDENT_AMBULATORY_CARE_PROVIDER_SITE_OTHER): Payer: Self-pay | Admitting: Physician Assistant

## 2021-06-05 ENCOUNTER — Ambulatory Visit (INDEPENDENT_AMBULATORY_CARE_PROVIDER_SITE_OTHER): Payer: Medicare PPO | Admitting: Physician Assistant

## 2021-06-05 VITALS — BP 109/63 | HR 78 | Temp 98.4°F | Ht 66.0 in | Wt 192.0 lb

## 2021-06-05 DIAGNOSIS — Z6833 Body mass index (BMI) 33.0-33.9, adult: Secondary | ICD-10-CM | POA: Diagnosis not present

## 2021-06-05 DIAGNOSIS — E669 Obesity, unspecified: Secondary | ICD-10-CM | POA: Diagnosis not present

## 2021-06-05 DIAGNOSIS — E119 Type 2 diabetes mellitus without complications: Secondary | ICD-10-CM | POA: Diagnosis not present

## 2021-06-05 DIAGNOSIS — E559 Vitamin D deficiency, unspecified: Secondary | ICD-10-CM | POA: Diagnosis not present

## 2021-06-05 DIAGNOSIS — E7849 Other hyperlipidemia: Secondary | ICD-10-CM

## 2021-06-05 NOTE — Progress Notes (Signed)
Chief Complaint:   OBESITY Kamalani is here to discuss her progress with her obesity treatment plan along with follow-up of her obesity related diagnoses. Hadlei is on keeping a food journal and adhering to recommended goals of [phone] calories and 100 grams protein and states she is following her eating plan approximately 85% of the time. Ray states she is doing PT 45 minutes 2 times per week.  Today's visit was #: 4 Starting weight: 220 lbs Starting date: 01/19/2018 Today's weight: 192 lbs Today's date: 06/05/2021 Total lbs lost to date: 28 Total lbs lost since last in-office visit: 1  Interim History: She continues to do well with weight loss. She is not journaling but is making sure she hits her protein goal daily. She will be out of town for Christmas.  Subjective:   1. Other hyperlipidemia Pt is on Lipitor and tolerating it well. She is followed by cardiology.  2. Controlled type 2 diabetes mellitus without complication, without long-term current use of insulin ([place]) She is on Metformin and her last A1c was 6.5.  3. Vitamin D deficiency Pt is on OTC Vit D 5,000 IU daily.  Assessment/Plan:   1. Other hyperlipidemia Cardiovascular risk and specific lipid/LDL goals reviewed.  We discussed several lifestyle modifications today and Martina will continue to work on diet, exercise and weight loss efforts. Orders and follow up as documented in patient record. Continue meds and f/u with cardiology.  Counseling Intensive lifestyle modifications are the first line treatment for this issue. Dietary changes: Increase soluble fiber. Decrease simple carbohydrates. Exercise changes: Moderate to vigorous-intensity aerobic activity 150 minutes per week if tolerated. Lipid-lowering medications: see documented in medical record. Check labs today.  - Lipid panel  2. Controlled type 2 diabetes mellitus without complication, without long-term current use of insulin  (HCC) Good blood sugar control is important to decrease the likelihood of diabetic complications such as nephropathy, neuropathy, limb loss, blindness, coronary artery disease, and death. Intensive lifestyle modification including diet, exercise and weight loss are the first line of treatment for diabetes. Check labs today.  - Comprehensive metabolic panel - Hemoglobin A1c - Insulin, random  3. Vitamin D deficiency Low Vitamin D level contributes to fatigue and are associated with obesity, breast, and colon cancer. She agrees to continue to take OTC Vitamin D 5,000 IU daily and will follow-up for routine testing of Vitamin D, at least 2-3 times per year to avoid over-replacement. Check labs today.  - VITAMIN D 25 Hydroxy (Vit-D Deficiency, Fractures)  4. Obesity with current BMI 31.  Gracee is currently in the action stage of change. As such, her goal is to continue with weight loss efforts. She has agreed to keeping a food journal and adhering to recommended goals of 1300-1400 calories and 100 grams protein.   Exercise goals:  As is  Behavioral modification strategies: meal planning and cooking strategies and keeping healthy foods in the home.  Bettymae has agreed to follow-up with our clinic in 3 weeks. She was informed of the importance of frequent follow-up visits to maximize her success with intensive lifestyle modifications for her multiple health conditions.   Objective:   Blood pressure 109/63, pulse 78, temperature 98.4 F (36.9 C), height 5\' 6"  (1.676 m), weight 192 lb (87.1 kg), SpO2 99 %. Body mass index is 30.99 kg/m.  General: Cooperative, alert, well developed, in no acute distress. HEENT: Conjunctivae and lids unremarkable. Cardiovascular: Regular rhythm.  Lungs: Normal work of breathing. Neurologic: No focal deficits.  Lab Results  Component Value Date   CREATININE 0.52 02/09/2021   BUN 22 02/09/2021   NA 136 02/09/2021   K 4.9 02/09/2021   CL 104  02/09/2021   CO2 26 02/09/2021   Lab Results  Component Value Date   ALT 19 08/15/2020   AST 33 08/15/2020   ALKPHOS 149 (H) 08/15/2020   BILITOT 0.5 08/15/2020   Lab Results  Component Value Date   HGBA1C 6.5 (H) 02/09/2021   HGBA1C 6.3 (H) 01/25/2021   HGBA1C 5.7 (H) 04/20/2020   HGBA1C 5.9 (A) 12/09/2019   HGBA1C 6.1 06/08/2019   Lab Results  Component Value Date   INSULIN 7.7 01/25/2021   INSULIN 13.1 04/20/2020   INSULIN 12.1 01/19/2018   Lab Results  Component Value Date   TSH 3.38 06/04/2018   Lab Results  Component Value Date   CHOL 141 04/20/2020   HDL 65 04/20/2020   LDLCALC 63 04/20/2020   LDLDIRECT 58.0 11/25/2017   TRIG 66 04/20/2020   CHOLHDL 2.2 04/20/2020   Lab Results  Component Value Date   VD25OH 74.5 01/25/2021   VD25OH 66.5 04/20/2020   VD25OH 52.21 06/08/2019   Lab Results  Component Value Date   WBC 11.5 (H) 02/09/2021   HGB 12.9 02/09/2021   HCT 41.1 02/09/2021   MCV 87.1 02/09/2021   PLT 401 (H) 02/09/2021   No results found for: IRON, TIBC, FERRITIN  Obesity Behavioral Intervention:   Approximately 15 minutes were spent on the discussion below.  ASK: We discussed the diagnosis of obesity with [name] today and [place] agreed to give [name] permission to discuss obesity behavioral modification therapy today.  ASSESS: Hiroko has the diagnosis of obesity and her BMI today is 31.1. Melisse is in the action stage of change.   ADVISE: Savvy was educated on the multiple health risks of obesity as well as the benefit of weight loss to improve her health. She was advised of the need for long term treatment and the importance of lifestyle modifications to improve her current health and to decrease her risk of future health problems.  AGREE: Multiple dietary modification options and treatment options were discussed and Yena agreed to follow the recommendations documented in the above note.  ARRANGE: Murle  was educated on the importance of frequent visits to treat obesity as outlined per CMS and USPSTF guidelines and agreed to schedule her next follow up appointment today.  Attestation Statements:   Reviewed by clinician on day of visit: allergies, medications, problem list, medical history, surgical history, family history, social history, and previous encounter notes.  Coral Ceo, CMA, am acting as transcriptionist for [employer], PA-C.  I have reviewed the above documentation for accuracy and completeness, and I agree with the above. [name], PA-C

## 2021-06-06 ENCOUNTER — Encounter: Payer: Self-pay | Admitting: Physical Therapy

## 2021-06-06 ENCOUNTER — Other Ambulatory Visit: Payer: Self-pay

## 2021-06-06 ENCOUNTER — Ambulatory Visit: Payer: Medicare PPO | Admitting: Physical Therapy

## 2021-06-06 ENCOUNTER — Encounter: Payer: Self-pay | Admitting: Cardiology

## 2021-06-06 DIAGNOSIS — M25512 Pain in left shoulder: Secondary | ICD-10-CM

## 2021-06-06 DIAGNOSIS — R600 Localized edema: Secondary | ICD-10-CM | POA: Diagnosis not present

## 2021-06-06 DIAGNOSIS — M25612 Stiffness of left shoulder, not elsewhere classified: Secondary | ICD-10-CM

## 2021-06-06 DIAGNOSIS — Z96612 Presence of left artificial shoulder joint: Secondary | ICD-10-CM

## 2021-06-06 LAB — COMPREHENSIVE METABOLIC PANEL
ALT: 15 IU/L (ref 0–32)
AST: 27 IU/L (ref 0–40)
Albumin/Globulin Ratio: 2.6 — ABNORMAL HIGH (ref 1.2–2.2)
Albumin: 4.7 g/dL (ref 3.8–4.8)
Alkaline Phosphatase: 251 IU/L — ABNORMAL HIGH (ref 44–121)
BUN/Creatinine Ratio: 25 (ref 12–28)
BUN: 16 mg/dL (ref 8–27)
Bilirubin Total: 0.4 mg/dL (ref 0.0–1.2)
CO2: 22 mmol/L (ref 20–29)
Calcium: 9.1 mg/dL (ref 8.7–10.3)
Chloride: 97 mmol/L (ref 96–106)
Creatinine, Ser: 0.63 mg/dL (ref 0.57–1.00)
Globulin, Total: 1.8 g/dL (ref 1.5–4.5)
Glucose: 133 mg/dL — ABNORMAL HIGH (ref 70–99)
Potassium: 5.2 mmol/L (ref 3.5–5.2)
Sodium: 135 mmol/L (ref 134–144)
Total Protein: 6.5 g/dL (ref 6.0–8.5)
eGFR: 95 mL/min/{1.73_m2} (ref >59)

## 2021-06-06 LAB — INSULIN, RANDOM: INSULIN: 12.9 u[IU]/mL (ref 2.6–24.9)

## 2021-06-06 LAB — VITAMIN D 25 HYDROXY (VIT D DEFICIENCY, FRACTURES): Vit D, 25-Hydroxy: 70.1 ng/mL (ref 30.0–100.0)

## 2021-06-06 LAB — LIPID PANEL
Chol/HDL Ratio: 2.3 ratio (ref 0.0–4.4)
Cholesterol, Total: 144 mg/dL (ref 100–199)
HDL: 64 mg/dL (ref >39)
LDL Chol Calc (NIH): 65 mg/dL (ref 0–99)
Triglycerides: 75 mg/dL (ref 0–149)
VLDL Cholesterol Cal: 15 mg/dL (ref 5–40)

## 2021-06-06 LAB — HEMOGLOBIN A1C
Est. average glucose Bld gHb Est-mCnc: 143 mg/dL
Hgb A1c MFr Bld: 6.6 % — ABNORMAL HIGH (ref 4.8–5.6)

## 2021-06-06 NOTE — Therapy (Signed)
[place]. [place], [place], [address] Phone: [phone]   Fax:  [phone]  Physical Therapy Treatment  Patient Details  Name: [name] MRN: [id number] Date of Birth: 14-Aug-1950 Referring Provider (PT): Mardelle Matte   Encounter Date: 06/06/2021   PT End of Session - 06/06/21 1556     Visit Number 17    Date for PT Re-Evaluation 07/13/21    Authorization Type [employer]    PT Start Time 1638    PT Stop Time 1605    PT Time Calculation (min) 50 min    Activity Tolerance Patient tolerated treatment well    Behavior During Therapy WFL for tasks assessed/performed             Past Medical History:  Diagnosis Date   Allergy    Anxiety    Arthritis    back & knee   Asthma     mild per pt shows up with resp illness   Colon polyps    Constipation    Diabetes mellitus without complication (HCC)    Gallstones    Gastric polyps    Gastroparesis    GERD (gastroesophageal reflux disease)    Headache    sinus headaches and migraines at times   Heart murmur    History of migraine headaches    HTN (hypertension)    Hyperlipidemia    IBS (irritable bowel syndrome)    Joint pain    Lumbar disc disease    PONV (postoperative nausea and vomiting)    S/P aortic valve replacement with bioprosthetic valve 08/23/2016   25 mm Edwards Intuity rapid-deployment bovine pericardial tissue valve via partial upper mini sternotomy   Sleep apnea    CPAP   TIA (transient ischemic attack)    TIA (transient ischemic attack)    hx of per pt    Past Surgical History:  Procedure Laterality Date   ABDOMINAL HYSTERECTOMY     AORTIC VALVE REPLACEMENT N/A 08/23/2016   Procedure: AORTIC VALVE REPLACEMENT (AVR) - using partial Upper Sternotomy- 33m Edwards Intuity Aortic Valve used;  Surgeon: CRexene Alberts MD;  [facility]  Service: Open Heart Surgery;  Laterality: N/A;   BLADDER SUSPENSION  2007   BREAST ENHANCEMENT  SURGERY  1980   CARPAL TUNNEL RELEASE Bilateral    COLONOSCOPY     DORSAL COMPARTMENT RELEASE Right 09/15/2014   Procedure: RIGHT WRIST DEQUERVAINS RELEASE ;  Surgeon: DKathryne Hitch MD;  [facility]  Service: Orthopedics;  Laterality: Right;   ESOPHAGOGASTRODUODENOSCOPY     EYE SURGERY     cataract surgery   KNEE ARTHROSCOPY  04/15/2012   Procedure: ARTHROSCOPY KNEE;  Surgeon: DNinetta Lights MD;  [facility]  Service: Orthopedics;  Laterality: Right;   LAPAROSCOPIC CHOLECYSTECTOMY     LASIK     OOPHORECTOMY     PALATE TO GINGIVA GRAFT  2017   REVERSE SHOULDER ARTHROPLASTY Left 02/13/2021   Procedure: REVERSE SHOULDER ARTHROPLASTY;  Surgeon: LMarchia Bond MD;  [facility];  Service: Orthopedics;  Laterality: Left;   RIGHT/LEFT HEART CATH AND CORONARY ANGIOGRAPHY N/A 08/02/2016   Procedure: Right/Left Heart Cath and Coronary Angiography;  Surgeon: MSherren Mocha MD;  [facility];  Service: Cardiovascular;  Laterality: N/A;   ROTATOR CUFF REPAIR Left    TEE WITHOUT CARDIOVERSION N/A 08/23/2016   Procedure: TRANSESOPHAGEAL ECHOCARDIOGRAM (TEE);  Surgeon: CRexene Alberts MD;  [facility]  Service:  Open Heart Surgery;  Laterality: N/A;   TOTAL KNEE ARTHROPLASTY  04/15/2012   Procedure: TOTAL KNEE ARTHROPLASTY;  Surgeon: [name], MD;  Location: [facility];  Service: Orthopedics;  Laterality: Right;   TRIGGER FINGER RELEASE Right 04/20/2015   Procedure: RIGHT TRIGGER FINGER RELEASE (TENDON SHEALTH INCISION) ;  Surgeon: [name], MD;  Location: [facility];  Service: Orthopedics;  Laterality: Right;    There were no vitals filed for this visit.   Subjective Assessment - 06/06/21 1518     Subjective "I am feeling good"    Currently in Pain? Yes    Pain Score 1     Pain Location Shoulder    Pain Orientation Left                [facility] PT Assessment - 06/06/21 0001       PROM   Left Shoulder Flexion  144 Degrees    Left Shoulder ABduction 115 Degrees    Left Shoulder Internal Rotation 48 Degrees    Left Shoulder External Rotation 58 Degrees                           [facility] Adult PT Treatment/Exercise - 06/06/21 0001       Shoulder Exercises: Standing   Other Standing Exercises Tricep Ext 15lb 2x10    Other Standing Exercises Bicep Curl 2lb 2x10      Shoulder Exercises: ROM/Strengthening   UBE (Upper Arm Bike) L2.5  x3 min each    Nustep L5 x 6 min    Lat Pull 20 reps;1.5 plate    Cybex Row 2 plate;20 reps    Other ROM/Strengthening Exercises LUE 2 level cabinet reaches  x10 flex &  abd 1lb    Other ROM/Strengthening Exercises Shoulder Ext 5lb 2x10      Vasopneumatic   Number Minutes Vasopneumatic  10 minutes    Vasopnuematic Location  Shoulder    Vasopneumatic Pressure Medium    Vasopneumatic Temperature  34      Manual Therapy   Manual Therapy Soft tissue mobilization;Passive ROM    Soft tissue mobilization to the left upper trap, the left upper arm and the teres area    Passive ROM PROM to end range a lot of cues to relax and when doing flexion as she tends to wing out                       PT Short Term Goals - 04/16/21 1510       PT SHORT TERM GOAL #1   Title independent with intial HEP    Status Achieved               PT Long Term Goals - 06/06/21 1558       PT LONG TERM GOAL #1   Title decrease pain 50%    Status Partially Met                   Plan - 06/06/21 1558     Clinical Impression Statement Pt continues to do well progression with L shoulder AROM in all directions. She reports that she could really feel it today with resisted 2 level cabinet reaches. Increase resistance tolerated with seated rows but was not able to tolerated it with Lat pull downs. Pt reports less pain int he upper trap area due to MT    Stability/Clinical Decision Making Evolving/Moderate  complexity    Rehab Potential Good    PT  Frequency 2x / week    PT Duration 12 weeks    PT Treatment/Interventions ADLs/Self Care Home Management;Cryotherapy;Electrical Stimulation;Therapeutic activities;Therapeutic exercise;Neuromuscular re-education;Manual techniques;Patient/family education;Cognitive remediation;Vasopneumatic Device    PT Next Visit Plan L shoulder AROM and strenght             Patient will benefit from skilled therapeutic intervention in order to improve the following deficits and impairments:  Decreased range of motion, Impaired UE functional use, Increased muscle spasms, Pain, Improper body mechanics, Postural dysfunction, Increased edema, Decreased strength, Decreased mobility  Visit Diagnosis: Acute pain of left shoulder  S/P reverse total shoulder arthroplasty, left  Stiffness of left shoulder, not elsewhere classified  Localized edema     Problem List Patient Active Problem List   Diagnosis Date Noted   S/P reverse total shoulder arthroplasty, left 02/13/2021   Idiopathic hyperphosphatasia 08/03/2019   Vitamin D deficiency 06/04/2018   Hyponatremia 02/17/2018   History of CVA in adulthood 08/13/2017   Blurry vision 08/13/2017   Hypertension associated with diabetes ([place]) 01/02/2017   S/P minimally invasive aortic valve replacement with bioprosthetic valve 00/37/0488   Diastolic dysfunction    Osteopenia 05/27/2016   OSA (obstructive sleep apnea) 02/27/2016   Class 1 obesity with serious comorbidity and body mass index (BMI) of 32.0 to 32.9 in adult 10/23/2015   Bruxism 10/23/2015   Elevated alkaline phosphatase level 05/16/2015   Atypical chest pain 11/10/2014   Acne 05/12/2014   Diabetes mellitus ([place]) 05/12/2014   GERD (gastroesophageal reflux disease) 03/10/2014   Generalized anxiety disorder 03/10/2014   Migraines 03/10/2014   EUSTACHIAN TUBE DYSFUNCTION, CHRONIC 06/14/2010   Irritable bowel syndrome 12/15/2008   Low back pain 08/11/2008   Hyperlipidemia associated with type  2 diabetes mellitus ([place]) 02/09/2008   Allergic rhinitis 02/12/2007   Asthma 02/12/2007    Scot Jun, PTA 06/06/2021, 4:05 PM  [place]. [place], [place], [address] Phone: [phone]   Fax:  [phone]  Name: SIVAN CUELLO MRN: [id number] Date of Birth: Oct 13, 1950

## 2021-06-12 ENCOUNTER — Ambulatory Visit: Payer: Medicare PPO | Admitting: Physical Therapy

## 2021-06-12 ENCOUNTER — Encounter: Payer: Self-pay | Admitting: Physical Therapy

## 2021-06-12 ENCOUNTER — Other Ambulatory Visit: Payer: Self-pay

## 2021-06-12 DIAGNOSIS — R600 Localized edema: Secondary | ICD-10-CM | POA: Diagnosis not present

## 2021-06-12 DIAGNOSIS — M25512 Pain in left shoulder: Secondary | ICD-10-CM | POA: Diagnosis not present

## 2021-06-12 DIAGNOSIS — M25612 Stiffness of left shoulder, not elsewhere classified: Secondary | ICD-10-CM | POA: Diagnosis not present

## 2021-06-12 DIAGNOSIS — Z96612 Presence of left artificial shoulder joint: Secondary | ICD-10-CM

## 2021-06-12 NOTE — Therapy (Signed)
[place]. [place], [place], [address] Phone: [phone]   Fax:  [phone]  Physical Therapy Treatment  Patient Details  Name: [name] MRN: [id number] Date of Birth: 15-Apr-1951 Referring Provider (PT): Mardelle Matte   Encounter Date: 06/12/2021   PT End of Session - 06/12/21 1614     Visit Number 18    Number of Visits 25    Date for PT Re-Evaluation 07/13/21    Authorization Type [employer]    PT Start Time 1525    PT Stop Time 1620    PT Time Calculation (min) 55 min    Activity Tolerance Patient tolerated treatment well    Behavior During Therapy WFL for tasks assessed/performed             Past Medical History:  Diagnosis Date   Allergy    Anxiety    Arthritis    back & knee   Asthma     mild per pt shows up with resp illness   Colon polyps    Constipation    Diabetes mellitus without complication (HCC)    Gallstones    Gastric polyps    Gastroparesis    GERD (gastroesophageal reflux disease)    Headache    sinus headaches and migraines at times   Heart murmur    History of migraine headaches    HTN (hypertension)    Hyperlipidemia    IBS (irritable bowel syndrome)    Joint pain    Lumbar disc disease    PONV (postoperative nausea and vomiting)    S/P aortic valve replacement with bioprosthetic valve 08/23/2016   25 mm Edwards Intuity rapid-deployment bovine pericardial tissue valve via partial upper mini sternotomy   Sleep apnea    CPAP   TIA (transient ischemic attack)    TIA (transient ischemic attack)    hx of per pt    Past Surgical History:  Procedure Laterality Date   ABDOMINAL HYSTERECTOMY     AORTIC VALVE REPLACEMENT N/A 08/23/2016   Procedure: AORTIC VALVE REPLACEMENT (AVR) - using partial Upper Sternotomy- 35m Edwards Intuity Aortic Valve used;  Surgeon: CRexene Alberts MD;  [facility]  Service: Open Heart Surgery;  Laterality: N/A;   BLADDER SUSPENSION   2007   BREAST ENHANCEMENT SURGERY  1980   CARPAL TUNNEL RELEASE Bilateral    COLONOSCOPY     DORSAL COMPARTMENT RELEASE Right 09/15/2014   Procedure: RIGHT WRIST DEQUERVAINS RELEASE ;  Surgeon: DKathryne Hitch MD;  [facility]  Service: Orthopedics;  Laterality: Right;   ESOPHAGOGASTRODUODENOSCOPY     EYE SURGERY     cataract surgery   KNEE ARTHROSCOPY  04/15/2012   Procedure: ARTHROSCOPY KNEE;  Surgeon: DNinetta Lights MD;  [facility]  Service: Orthopedics;  Laterality: Right;   LAPAROSCOPIC CHOLECYSTECTOMY     LASIK     OOPHORECTOMY     PALATE TO GINGIVA GRAFT  2017   REVERSE SHOULDER ARTHROPLASTY Left 02/13/2021   Procedure: REVERSE SHOULDER ARTHROPLASTY;  Surgeon: LMarchia Bond MD;  [facility];  Service: Orthopedics;  Laterality: Left;   RIGHT/LEFT HEART CATH AND CORONARY ANGIOGRAPHY N/A 08/02/2016   Procedure: Right/Left Heart Cath and Coronary Angiography;  Surgeon: MSherren Mocha MD;  [facility];  Service: Cardiovascular;  Laterality: N/A;   ROTATOR CUFF REPAIR Left    TEE WITHOUT CARDIOVERSION N/A 08/23/2016   Procedure: TRANSESOPHAGEAL ECHOCARDIOGRAM (TEE);  Surgeon: CRexene Alberts  MD;  [facility];  Service: Open Heart Surgery;  Laterality: N/A;   TOTAL KNEE ARTHROPLASTY  04/15/2012   Procedure: TOTAL KNEE ARTHROPLASTY;  Surgeon: [name], MD;  Location: [facility];  Service: Orthopedics;  Laterality: Right;   TRIGGER FINGER RELEASE Right 04/20/2015   Procedure: RIGHT TRIGGER FINGER RELEASE (TENDON SHEALTH INCISION) ;  Surgeon: [name], MD;  Location: [facility];  Service: Orthopedics;  Laterality: Right;    There were no vitals filed for this visit.   Subjective Assessment - 06/12/21 1529     Subjective The cold is really causing soreness and pain    Currently in Pain? Yes    Pain Score 3     Pain Location Shoulder    Pain Orientation Left    Pain Descriptors / Indicators  Sore;Aching;Tightness    Aggravating Factors  cold weather                [facility] PT Assessment - 06/12/21 0001       AROM   Left Shoulder Flexion 115 Degrees   scaption   Left Shoulder ABduction 100 Degrees    Left Shoulder External Rotation 50 Degrees                           [facility] Adult PT Treatment/Exercise - 06/12/21 0001       Shoulder Exercises: Standing   Horizontal ABduction Both;20 reps;Theraband    Theraband Level (Shoulder Horizontal ABduction) Level 1 (Yellow)    External Rotation Both;20 reps;Theraband    Theraband Level (Shoulder External Rotation) Level 1 (Yellow)    Other Standing Exercises Tricep Ext20lb 2x10    Other Standing Exercises Bicep Curl 2lb 2x10      Shoulder Exercises: ROM/Strengthening   Nustep L5 x 6 min    Lat Pull 20 reps;1.5 plate    Cybex Press 1 plate;20 reps    Cybex Row 2 plate;20 reps      Vasopneumatic   Number Minutes Vasopneumatic  10 minutes    Vasopnuematic Location  Shoulder    Vasopneumatic Pressure Medium    Vasopneumatic Temperature  34      Manual Therapy   Manual Therapy Soft tissue mobilization;Passive ROM    Soft tissue mobilization to the left upper trap, the left upper arm and the teres area    Passive ROM PROM to end range a lot of cues to relax and when doing flexion as she tends to wing out                       PT Short Term Goals - 04/16/21 1510       PT SHORT TERM GOAL #1   Title independent with intial HEP    Status Achieved               PT Long Term Goals - 06/06/21 1558       PT LONG TERM GOAL #1   Title decrease pain 50%    Status Partially Met                   Plan - 06/12/21 1614     Clinical Impression Statement Patient with some increased in ROM. still stiff and tight, she has difficulty with above shoulder strength, good PROM, still tender and sore in the left deltoid and the upper trap    PT Next Visit Plan L shoulder AROM  and  strenght    Consulted and Agree with Plan of Care Patient             Patient will benefit from skilled therapeutic intervention in order to improve the following deficits and impairments:  Decreased range of motion, Impaired UE functional use, Increased muscle spasms, Pain, Improper body mechanics, Postural dysfunction, Increased edema, Decreased strength, Decreased mobility  Visit Diagnosis: Acute pain of left shoulder  S/P reverse total shoulder arthroplasty, left  Stiffness of left shoulder, not elsewhere classified  Localized edema     Problem List Patient Active Problem List   Diagnosis Date Noted   S/P reverse total shoulder arthroplasty, left 02/13/2021   Idiopathic hyperphosphatasia 08/03/2019   Vitamin D deficiency 06/04/2018   Hyponatremia 02/17/2018   History of CVA in adulthood 08/13/2017   Blurry vision 08/13/2017   Hypertension associated with diabetes ([place]) 01/02/2017   S/P minimally invasive aortic valve replacement with bioprosthetic valve 23/55/7322   Diastolic dysfunction    Osteopenia 05/27/2016   OSA (obstructive sleep apnea) 02/27/2016   Class 1 obesity with serious comorbidity and body mass index (BMI) of 32.0 to 32.9 in adult 10/23/2015   Bruxism 10/23/2015   Elevated alkaline phosphatase level 05/16/2015   Atypical chest pain 11/10/2014   Acne 05/12/2014   Diabetes mellitus ([place]) 05/12/2014   GERD (gastroesophageal reflux disease) 03/10/2014   Generalized anxiety disorder 03/10/2014   Migraines 03/10/2014   EUSTACHIAN TUBE DYSFUNCTION, CHRONIC 06/14/2010   Irritable bowel syndrome 12/15/2008   Low back pain 08/11/2008   Hyperlipidemia associated with type 2 diabetes mellitus ([place]) 02/09/2008   Allergic rhinitis 02/12/2007   Asthma 02/12/2007    Sumner Boast, PT 06/12/2021, 4:16 PM  [place]. [place], [place], [address] Phone: [phone]   Fax:   [phone]  Name: [name] MRN: [id number] Date of Birth: [birth year]

## 2021-06-13 DIAGNOSIS — H26492 Other secondary cataract, left eye: Secondary | ICD-10-CM | POA: Diagnosis not present

## 2021-06-14 ENCOUNTER — Encounter: Payer: Self-pay | Admitting: Physical Therapy

## 2021-06-14 ENCOUNTER — Ambulatory Visit: Payer: Medicare PPO | Admitting: Physical Therapy

## 2021-06-14 ENCOUNTER — Other Ambulatory Visit: Payer: Self-pay

## 2021-06-14 DIAGNOSIS — M25612 Stiffness of left shoulder, not elsewhere classified: Secondary | ICD-10-CM | POA: Diagnosis not present

## 2021-06-14 DIAGNOSIS — M25512 Pain in left shoulder: Secondary | ICD-10-CM | POA: Diagnosis not present

## 2021-06-14 DIAGNOSIS — R600 Localized edema: Secondary | ICD-10-CM | POA: Diagnosis not present

## 2021-06-14 DIAGNOSIS — Z96612 Presence of left artificial shoulder joint: Secondary | ICD-10-CM

## 2021-06-14 NOTE — Therapy (Signed)
[facility]. [place], [place], [address] Phone: [phone]   Fax:  [phone]  Physical Therapy Treatment  Patient Details  Name: [name] MRN: [id number] Date of Birth: 1950-08-21 Referring Provider (PT): Mardelle Matte   Encounter Date: 06/14/2021   PT End of Session - 06/14/21 1659     Visit Number 19    Number of Visits 25    Date for PT Re-Evaluation 07/13/21    Authorization Type [employer]    PT Start Time 1523    PT Stop Time 1615    PT Time Calculation (min) 52 min    Activity Tolerance Patient tolerated treatment well    Behavior During Therapy WFL for tasks assessed/performed             Past Medical History:  Diagnosis Date   Allergy    Anxiety    Arthritis    back & knee   Asthma     mild per pt shows up with resp illness   Colon polyps    Constipation    Diabetes mellitus without complication (HCC)    Gallstones    Gastric polyps    Gastroparesis    GERD (gastroesophageal reflux disease)    Headache    sinus headaches and migraines at times   Heart murmur    History of migraine headaches    HTN (hypertension)    Hyperlipidemia    IBS (irritable bowel syndrome)    Joint pain    Lumbar disc disease    PONV (postoperative nausea and vomiting)    S/P aortic valve replacement with bioprosthetic valve 08/23/2016   25 mm Edwards Intuity rapid-deployment bovine pericardial tissue valve via partial upper mini sternotomy   Sleep apnea    CPAP   TIA (transient ischemic attack)    TIA (transient ischemic attack)    hx of per pt    Past Surgical History:  Procedure Laterality Date   ABDOMINAL HYSTERECTOMY     AORTIC VALVE REPLACEMENT N/A 08/23/2016   Procedure: AORTIC VALVE REPLACEMENT (AVR) - using partial Upper Sternotomy- 47m Edwards Intuity Aortic Valve used;  Surgeon: CRexene Alberts MD;  [facility]  Service: Open Heart Surgery;  Laterality: N/A;   BLADDER SUSPENSION   2007   BREAST ENHANCEMENT SURGERY  1980   CARPAL TUNNEL RELEASE Bilateral    COLONOSCOPY     DORSAL COMPARTMENT RELEASE Right 09/15/2014   Procedure: RIGHT WRIST DEQUERVAINS RELEASE ;  Surgeon: DKathryne Hitch MD;  [facility]  Service: Orthopedics;  Laterality: Right;   ESOPHAGOGASTRODUODENOSCOPY     EYE SURGERY     cataract surgery   KNEE ARTHROSCOPY  04/15/2012   Procedure: ARTHROSCOPY KNEE;  Surgeon: DNinetta Lights MD;  [facility]  Service: Orthopedics;  Laterality: Right;   LAPAROSCOPIC CHOLECYSTECTOMY     LASIK     OOPHORECTOMY     PALATE TO GINGIVA GRAFT  2017   REVERSE SHOULDER ARTHROPLASTY Left 02/13/2021   Procedure: REVERSE SHOULDER ARTHROPLASTY;  Surgeon: LMarchia Bond MD;  [facility];  Service: Orthopedics;  Laterality: Left;   RIGHT/LEFT HEART CATH AND CORONARY ANGIOGRAPHY N/A 08/02/2016   Procedure: Right/Left Heart Cath and Coronary Angiography;  Surgeon: MSherren Mocha MD;  [facility];  Service: Cardiovascular;  Laterality: N/A;   ROTATOR CUFF REPAIR Left    TEE WITHOUT CARDIOVERSION N/A 08/23/2016   Procedure: TRANSESOPHAGEAL ECHOCARDIOGRAM (TEE);  Surgeon: CRexene Alberts  MD;  [facility];  Service: Open Heart Surgery;  Laterality: N/A;   TOTAL KNEE ARTHROPLASTY  04/15/2012   Procedure: TOTAL KNEE ARTHROPLASTY;  Surgeon: [name], MD;  Location: [facility];  Service: Orthopedics;  Laterality: Right;   TRIGGER FINGER RELEASE Right 04/20/2015   Procedure: RIGHT TRIGGER FINGER RELEASE (TENDON SHEALTH INCISION) ;  Surgeon: [name], MD;  Location: [facility];  Service: Orthopedics;  Laterality: Right;    There were no vitals filed for this visit.   Subjective Assessment - 06/14/21 1522     Subjective I was a little sore and am a little sore today    Currently in Pain? Yes    Pain Score 3     Pain Location Shoulder    Pain Orientation Left    Pain Descriptors / Indicators Sore     Aggravating Factors  stretching                               [facility] Adult PT Treatment/Exercise - 06/14/21 0001       Shoulder Exercises: Standing   Horizontal ABduction Both;20 reps;Theraband    Theraband Level (Shoulder Horizontal ABduction) Level 2 (Red)    External Rotation Both;20 reps;Theraband    Theraband Level (Shoulder External Rotation) Level 1 (Yellow)    Other Standing Exercises 2# abduction    Other Standing Exercises cabinet reaching 1 and 2#, ER and IR 2#      Shoulder Exercises: ROM/Strengthening   UBE (Upper Arm Bike) L3.5  x3 min each    Lat Pull 20 reps;1.5 plate    Cybex Press 1 plate;20 reps    Cybex Row 2 plate;20 reps    Other ROM/Strengthening Exercises Shoulder Ext 5lb 2x10      Vasopneumatic   Number Minutes Vasopneumatic  10 minutes    Vasopnuematic Location  Shoulder    Vasopneumatic Pressure Medium    Vasopneumatic Temperature  34      Manual Therapy   Manual Therapy Soft tissue mobilization;Passive ROM    Soft tissue mobilization to the left upper trap, the left upper arm and the teres area    Passive ROM PROM to end range a lot of cues to relax and when doing flexion as she tends to wing out                       PT Short Term Goals - 04/16/21 1510       PT SHORT TERM GOAL #1   Title independent with intial HEP    Status Achieved               PT Long Term Goals - 06/14/21 1701       PT LONG TERM GOAL #1   Title decrease pain 50%    Status Partially Met      PT LONG TERM GOAL #2   Title returns safely to the gym    Status Partially Met                   Plan - 06/14/21 1700     Clinical Impression Statement Could ability with the cabinet reaching, she does still do some scaption and has difficulty with true flexion.  Tolerates PROM well    PT Next Visit Plan work on function, getting hand to top of head for hair    Consulted and Agree with  Plan of Care Patient              Patient will benefit from skilled therapeutic intervention in order to improve the following deficits and impairments:  Decreased range of motion, Impaired UE functional use, Increased muscle spasms, Pain, Improper body mechanics, Postural dysfunction, Increased edema, Decreased strength, Decreased mobility  Visit Diagnosis: Acute pain of left shoulder  S/P reverse total shoulder arthroplasty, left  Stiffness of left shoulder, not elsewhere classified  Localized edema     Problem List Patient Active Problem List   Diagnosis Date Noted   S/P reverse total shoulder arthroplasty, left 02/13/2021   Idiopathic hyperphosphatasia 08/03/2019   Vitamin D deficiency 06/04/2018   Hyponatremia 02/17/2018   History of CVA in adulthood 08/13/2017   Blurry vision 08/13/2017   Hypertension associated with diabetes ([place]) 01/02/2017   S/P minimally invasive aortic valve replacement with bioprosthetic valve 40/98/1191   Diastolic dysfunction    Osteopenia 05/27/2016   OSA (obstructive sleep apnea) 02/27/2016   Class 1 obesity with serious comorbidity and body mass index (BMI) of 32.0 to 32.9 in adult 10/23/2015   Bruxism 10/23/2015   Elevated alkaline phosphatase level 05/16/2015   Atypical chest pain 11/10/2014   Acne 05/12/2014   Diabetes mellitus ([place]) 05/12/2014   GERD (gastroesophageal reflux disease) 03/10/2014   Generalized anxiety disorder 03/10/2014   Migraines 03/10/2014   EUSTACHIAN TUBE DYSFUNCTION, CHRONIC 06/14/2010   Irritable bowel syndrome 12/15/2008   Low back pain 08/11/2008   Hyperlipidemia associated with type 2 diabetes mellitus ([place]) 02/09/2008   Allergic rhinitis 02/12/2007   Asthma 02/12/2007    Sumner Boast, PT 06/14/2021, 5:02 PM  [facility]. [place], [place], [address] Phone: [phone]   Fax:  [phone]  Name: [name] MRN: [id number] Date of Birth:  1951/06/16

## 2021-06-19 ENCOUNTER — Other Ambulatory Visit: Payer: Self-pay

## 2021-06-19 ENCOUNTER — Encounter: Payer: Self-pay | Admitting: Physical Therapy

## 2021-06-19 ENCOUNTER — Ambulatory Visit: Payer: Medicare PPO | Admitting: Physical Therapy

## 2021-06-19 DIAGNOSIS — R600 Localized edema: Secondary | ICD-10-CM | POA: Diagnosis not present

## 2021-06-19 DIAGNOSIS — M25512 Pain in left shoulder: Secondary | ICD-10-CM

## 2021-06-19 DIAGNOSIS — M25612 Stiffness of left shoulder, not elsewhere classified: Secondary | ICD-10-CM

## 2021-06-19 DIAGNOSIS — Z96612 Presence of left artificial shoulder joint: Secondary | ICD-10-CM

## 2021-06-19 NOTE — Therapy (Signed)
[facility]. [place], [place], [address] Phone: [phone]   Fax:  [phone] Progress Note Reporting Period 05/14/21 to 06/19/21 for visit 11-20  See note below for Objective Data and Assessment of Progress/Goals.     Physical Therapy Treatment  Patient Details  Name: [name] MRN: [id number] Date of Birth: 06-19-1951 Referring Provider (PT): Mardelle Matte   Encounter Date: 06/19/2021   PT End of Session - 06/19/21 1550     Visit Number 20    Date for PT Re-Evaluation 07/13/21    Authorization Type [employer]    PT Start Time 1510    PT Stop Time 1600    PT Time Calculation (min) 50 min    Activity Tolerance Patient tolerated treatment well    Behavior During Therapy WFL for tasks assessed/performed             Past Medical History:  Diagnosis Date   Allergy    Anxiety    Arthritis    back & knee   Asthma     mild per pt shows up with resp illness   Colon polyps    Constipation    Diabetes mellitus without complication (HCC)    Gallstones    Gastric polyps    Gastroparesis    GERD (gastroesophageal reflux disease)    Headache    sinus headaches and migraines at times   Heart murmur    History of migraine headaches    HTN (hypertension)    Hyperlipidemia    IBS (irritable bowel syndrome)    Joint pain    Lumbar disc disease    PONV (postoperative nausea and vomiting)    S/P aortic valve replacement with bioprosthetic valve 08/23/2016   25 mm Edwards Intuity rapid-deployment bovine pericardial tissue valve via partial upper mini sternotomy   Sleep apnea    CPAP   TIA (transient ischemic attack)    TIA (transient ischemic attack)    hx of per pt    Past Surgical History:  Procedure Laterality Date   ABDOMINAL HYSTERECTOMY     AORTIC VALVE REPLACEMENT N/A 08/23/2016   Procedure: AORTIC VALVE REPLACEMENT (AVR) - using partial Upper Sternotomy- 88m Edwards Intuity Aortic Valve used;   Surgeon: CRexene Alberts MD;  [facility]  Service: Open Heart Surgery;  Laterality: N/A;   BLADDER SUSPENSION  2007   BREAST ENHANCEMENT SURGERY  1980   CARPAL TUNNEL RELEASE Bilateral    COLONOSCOPY     DORSAL COMPARTMENT RELEASE Right 09/15/2014   Procedure: RIGHT WRIST DEQUERVAINS RELEASE ;  Surgeon: DKathryne Hitch MD;  [facility]  Service: Orthopedics;  Laterality: Right;   ESOPHAGOGASTRODUODENOSCOPY     EYE SURGERY     cataract surgery   KNEE ARTHROSCOPY  04/15/2012   Procedure: ARTHROSCOPY KNEE;  Surgeon: DNinetta Lights MD;  [facility]  Service: Orthopedics;  Laterality: Right;   LAPAROSCOPIC CHOLECYSTECTOMY     LASIK     OOPHORECTOMY     PALATE TO GINGIVA GRAFT  2017   REVERSE SHOULDER ARTHROPLASTY Left 02/13/2021   Procedure: REVERSE SHOULDER ARTHROPLASTY;  Surgeon: LMarchia Bond MD;  [facility];  Service: Orthopedics;  Laterality: Left;   RIGHT/LEFT HEART CATH AND CORONARY ANGIOGRAPHY N/A 08/02/2016   Procedure: Right/Left Heart Cath and Coronary Angiography;  Surgeon: MSherren Mocha MD;  [facility];  Service: Cardiovascular;  Laterality: N/A;   ROTATOR CUFF REPAIR Left  TEE WITHOUT CARDIOVERSION N/A 08/23/2016   Procedure: TRANSESOPHAGEAL ECHOCARDIOGRAM (TEE);  Surgeon: Rexene Alberts, MD;  Location: [facility];  Service: Open Heart Surgery;  Laterality: N/A;   TOTAL KNEE ARTHROPLASTY  04/15/2012   Procedure: TOTAL KNEE ARTHROPLASTY;  Surgeon: [name], MD;  Location: [facility];  Service: Orthopedics;  Laterality: Right;   TRIGGER FINGER RELEASE Right 04/20/2015   Procedure: RIGHT TRIGGER FINGER RELEASE (TENDON SHEALTH INCISION) ;  Surgeon: [name], MD;  Location: [facility];  Service: Orthopedics;  Laterality: Right;    There were no vitals filed for this visit.   Subjective Assessment - 06/19/21 1509     Subjective Once I got up and started moving around I was ok.    Currently in Pain?  No/denies   some stiffness               [facility] PT Assessment - 06/19/21 0001       AROM   Left Shoulder Flexion 119 Degrees    Left Shoulder ABduction 103 Degrees    Left Shoulder External Rotation 52 Degrees                           [facility] Adult PT Treatment/Exercise - 06/19/21 0001       Shoulder Exercises: Standing   Horizontal ABduction Both;20 reps;Theraband    Theraband Level (Shoulder Horizontal ABduction) Level 2 (Red)    External Rotation Both;20 reps;Theraband    Theraband Level (Shoulder External Rotation) Level 1 (Yellow)    Internal Rotation Strengthening;Left;Theraband;20 reps    Theraband Level (Shoulder Internal Rotation) Level 1 (Yellow)    Other Standing Exercises 2# abduction    Other Standing Exercises cabinet reaching 1 and 2#, ER and IR 2#      Shoulder Exercises: ROM/Strengthening   UBE (Upper Arm Bike) L3.5  x2 min each    Lat Pull 20 reps;1.5 plate    Cybex Press 1 plate;20 reps    Cybex Row 2 plate;20 reps    Other ROM/Strengthening Exercises Shoulder Ext 5lb 2x10      Vasopneumatic   Number Minutes Vasopneumatic  10 minutes    Vasopnuematic Location  Shoulder    Vasopneumatic Pressure Medium    Vasopneumatic Temperature  34      Manual Therapy   Manual Therapy Soft tissue mobilization;Passive ROM    Soft tissue mobilization to the left upper trap, the left upper arm and the teres area    Passive ROM PROM to end range a lot of cues to relax and when doing flexion as she tends to wing out                       PT Short Term Goals - 04/16/21 1510       PT SHORT TERM GOAL #1   Title independent with intial HEP    Status Achieved               PT Long Term Goals - 06/19/21 1551       PT LONG TERM GOAL #1   Title decrease pain 50%    Status Partially Met      PT LONG TERM GOAL #2   Title returns safely to the gym    Status Partially Met      PT LONG TERM GOAL #3   Status Partially Met  Plan - 06/19/21 1552     Clinical Impression Statement Pt continues to tolerated PROM well. Cue to keep elbow in with flexion to avoid a scaption motion. cues needed to control the eccentric phase of cabinet reaches. Son anterior deltoid/pec soreness with STM    Stability/Clinical Decision Making Evolving/Moderate complexity    Rehab Potential Good    PT Frequency 2x / week    PT Duration 12 weeks    PT Treatment/Interventions ADLs/Self Care Home Management;Cryotherapy;Electrical Stimulation;Therapeutic activities;Therapeutic exercise;Neuromuscular re-education;Manual techniques;Patient/family education;Cognitive remediation;Vasopneumatic Device    PT Next Visit Plan work on function, getting hand to top of head for hair             Patient will benefit from skilled therapeutic intervention in order to improve the following deficits and impairments:  Decreased range of motion, Impaired UE functional use, Increased muscle spasms, Pain, Improper body mechanics, Postural dysfunction, Increased edema, Decreased strength, Decreased mobility  Visit Diagnosis: Acute pain of left shoulder  S/P reverse total shoulder arthroplasty, left  Localized edema  Stiffness of left shoulder, not elsewhere classified     Problem List Patient Active Problem List   Diagnosis Date Noted   S/P reverse total shoulder arthroplasty, left 02/13/2021   Idiopathic hyperphosphatasia 08/03/2019   Vitamin D deficiency 06/04/2018   Hyponatremia 02/17/2018   History of CVA in adulthood 08/13/2017   Blurry vision 08/13/2017   Hypertension associated with diabetes ([place]) 01/02/2017   S/P minimally invasive aortic valve replacement with bioprosthetic valve 64/15/8309   Diastolic dysfunction    Osteopenia 05/27/2016   OSA (obstructive sleep apnea) 02/27/2016   Class 1 obesity with serious comorbidity and body mass index (BMI) of 32.0 to 32.9 in adult 10/23/2015   Bruxism 10/23/2015    Elevated alkaline phosphatase level 05/16/2015   Atypical chest pain 11/10/2014   Acne 05/12/2014   Diabetes mellitus ([place]) 05/12/2014   GERD (gastroesophageal reflux disease) 03/10/2014   Generalized anxiety disorder 03/10/2014   Migraines 03/10/2014   EUSTACHIAN TUBE DYSFUNCTION, CHRONIC 06/14/2010   Irritable bowel syndrome 12/15/2008   Low back pain 08/11/2008   Hyperlipidemia associated with type 2 diabetes mellitus ([place]) 02/09/2008   Allergic rhinitis 02/12/2007   Asthma 02/12/2007    Scot Jun, PTA 06/19/2021, 3:55 PM  [facility]. [place], [place], [address] Phone: [phone]   Fax:  [phone]  Name: [name] MRN: [id number] Date of Birth: 1951-01-14

## 2021-06-21 ENCOUNTER — Other Ambulatory Visit: Payer: Self-pay

## 2021-06-21 ENCOUNTER — Encounter: Payer: Self-pay | Admitting: Physical Therapy

## 2021-06-21 ENCOUNTER — Ambulatory Visit: Payer: Medicare PPO | Admitting: Physical Therapy

## 2021-06-21 DIAGNOSIS — M25512 Pain in left shoulder: Secondary | ICD-10-CM | POA: Diagnosis not present

## 2021-06-21 DIAGNOSIS — Z96612 Presence of left artificial shoulder joint: Secondary | ICD-10-CM

## 2021-06-21 DIAGNOSIS — R600 Localized edema: Secondary | ICD-10-CM

## 2021-06-21 DIAGNOSIS — M25612 Stiffness of left shoulder, not elsewhere classified: Secondary | ICD-10-CM | POA: Diagnosis not present

## 2021-06-21 NOTE — Therapy (Signed)
[facility]. [place], [place], [address] Phone: [phone]   Fax:  [phone]  Physical Therapy Treatment  Patient Details  Name: [name] MRN: [id number] Date of Birth: 1951-03-16 Referring Provider (PT): Mardelle Matte   Encounter Date: 06/21/2021   PT End of Session - 06/21/21 1550     Visit Number 21    Date for PT Re-Evaluation 07/13/21    PT Start Time 0867    PT Stop Time 1606    PT Time Calculation (min) 51 min    Activity Tolerance Patient tolerated treatment well    Behavior During Therapy [facility] for tasks assessed/performed             Past Medical History:  Diagnosis Date   Allergy    Anxiety    Arthritis    back & knee   Asthma     mild per pt shows up with resp illness   Colon polyps    Constipation    Diabetes mellitus without complication (HCC)    Gallstones    Gastric polyps    Gastroparesis    GERD (gastroesophageal reflux disease)    Headache    sinus headaches and migraines at times   Heart murmur    History of migraine headaches    HTN (hypertension)    Hyperlipidemia    IBS (irritable bowel syndrome)    Joint pain    Lumbar disc disease    PONV (postoperative nausea and vomiting)    S/P aortic valve replacement with bioprosthetic valve 08/23/2016   25 mm Edwards Intuity rapid-deployment bovine pericardial tissue valve via partial upper mini sternotomy   Sleep apnea    CPAP   TIA (transient ischemic attack)    TIA (transient ischemic attack)    hx of per pt    Past Surgical History:  Procedure Laterality Date   ABDOMINAL HYSTERECTOMY     AORTIC VALVE REPLACEMENT N/A 08/23/2016   Procedure: AORTIC VALVE REPLACEMENT (AVR) - using partial Upper Sternotomy- 37m Edwards Intuity Aortic Valve used;  Surgeon: CRexene Alberts MD;  [facility]  Service: Open Heart Surgery;  Laterality: N/A;   BLADDER SUSPENSION  2007   BREAST ENHANCEMENT SURGERY  1980   CARPAL  TUNNEL RELEASE Bilateral    COLONOSCOPY     DORSAL COMPARTMENT RELEASE Right 09/15/2014   Procedure: RIGHT WRIST DEQUERVAINS RELEASE ;  Surgeon: DKathryne Hitch MD;  [facility]  Service: Orthopedics;  Laterality: Right;   ESOPHAGOGASTRODUODENOSCOPY     EYE SURGERY     cataract surgery   KNEE ARTHROSCOPY  04/15/2012   Procedure: ARTHROSCOPY KNEE;  Surgeon: DNinetta Lights MD;  [facility]  Service: Orthopedics;  Laterality: Right;   LAPAROSCOPIC CHOLECYSTECTOMY     LASIK     OOPHORECTOMY     PALATE TO GINGIVA GRAFT  2017   REVERSE SHOULDER ARTHROPLASTY Left 02/13/2021   Procedure: REVERSE SHOULDER ARTHROPLASTY;  Surgeon: LMarchia Bond MD;  [facility];  Service: Orthopedics;  Laterality: Left;   RIGHT/LEFT HEART CATH AND CORONARY ANGIOGRAPHY N/A 08/02/2016   Procedure: Right/Left Heart Cath and Coronary Angiography;  Surgeon: MSherren Mocha MD;  [facility];  Service: Cardiovascular;  Laterality: N/A;   ROTATOR CUFF REPAIR Left    TEE WITHOUT CARDIOVERSION N/A 08/23/2016   Procedure: TRANSESOPHAGEAL ECHOCARDIOGRAM (TEE);  Surgeon: CRexene Alberts MD;  [facility]  Service: Open Heart Surgery;  Laterality: N/A;  TOTAL KNEE ARTHROPLASTY  04/15/2012   Procedure: TOTAL KNEE ARTHROPLASTY;  Surgeon: [name], MD;  Location: [facility];  Service: Orthopedics;  Laterality: Right;   TRIGGER FINGER RELEASE Right 04/20/2015   Procedure: RIGHT TRIGGER FINGER RELEASE (TENDON SHEALTH INCISION) ;  Surgeon: [name], MD;  Location: [facility];  Service: Orthopedics;  Laterality: Right;    There were no vitals filed for this visit.   Subjective Assessment - 06/21/21 1511     Subjective "I am feeling pretty good"    Currently in Pain? No/denies    Pain Location Shoulder   stiffness   Pain Orientation Left                               [facility] Adult PT Treatment/Exercise - 06/21/21 0001        Shoulder Exercises: Supine   Flexion Strengthening;Left;20 reps;Weights    Shoulder Flexion Weight (lbs) 2    Other Supine Exercises ER/IR 2lb 2x10      Shoulder Exercises: Standing   Extension Strengthening;Both;20 reps;Weights    Extension Weight (lbs) 5    Row Strengthening;Both;10 reps    Row Weight (lbs) 10    Other Standing Exercises cabinet reaching 2# x 10 flex & abd      Shoulder Exercises: ROM/Strengthening   UBE (Upper Arm Bike) L3.5  x3 min each    Nustep L5 x 5 min    Lat Pull 2 plate;10 reps    Cybex Press 1 plate;20 reps    Cybex Row 2 plate   1J03     Vasopneumatic   Number Minutes Vasopneumatic  10 minutes    Vasopnuematic Location  Shoulder    Vasopneumatic Pressure Medium    Vasopneumatic Temperature  34      Manual Therapy   Manual Therapy Soft tissue mobilization;Passive ROM    Manual therapy comments supine    Soft tissue mobilization to the left upper trap, the left upper arm and the teres area    Passive ROM PROM to end range a lot of cues to relax and when doing flexion as she tends to wing out                       PT Short Term Goals - 04/16/21 1510       PT SHORT TERM GOAL #1   Title independent with intial HEP    Status Achieved               PT Long Term Goals - 06/19/21 1551       PT LONG TERM GOAL #1   Title decrease pain 50%    Status Partially Met      PT LONG TERM GOAL #2   Title returns safely to the gym    Status Partially Met      PT LONG TERM GOAL #3   Status Partially Met                   Plan - 06/21/21 1551     Clinical Impression Statement Pt is progressing well overall. Cue for control needed with cabinet reaches. She continues to compensate with flexion movement by allowing her elbow to flare out in more of a scaption motion. Limited LUE ER ROM present with supine ER/IR with resistance. Increase resistance tolerated with lat pull downs. Some end range pain with PROM.  Stability/Clinical Decision Making Evolving/Moderate complexity    Rehab Potential Good    PT Frequency 2x / week    PT Duration 12 weeks    PT Treatment/Interventions ADLs/Self Care Home Management;Cryotherapy;Electrical Stimulation;Therapeutic activities;Therapeutic exercise;Neuromuscular re-education;Manual techniques;Patient/family education;Cognitive remediation;Vasopneumatic Device    PT Next Visit Plan work on function, getting hand to top of head for hair             Patient will benefit from skilled therapeutic intervention in order to improve the following deficits and impairments:  Decreased range of motion, Impaired UE functional use, Increased muscle spasms, Pain, Improper body mechanics, Postural dysfunction, Increased edema, Decreased strength, Decreased mobility  Visit Diagnosis: Acute pain of left shoulder  Stiffness of left shoulder, not elsewhere classified  S/P reverse total shoulder arthroplasty, left  Localized edema     Problem List Patient Active Problem List   Diagnosis Date Noted   S/P reverse total shoulder arthroplasty, left 02/13/2021   Idiopathic hyperphosphatasia 08/03/2019   Vitamin D deficiency 06/04/2018   Hyponatremia 02/17/2018   History of CVA in adulthood 08/13/2017   Blurry vision 08/13/2017   Hypertension associated with diabetes ([place]) 01/02/2017   S/P minimally invasive aortic valve replacement with bioprosthetic valve 16/60/6004   Diastolic dysfunction    Osteopenia 05/27/2016   OSA (obstructive sleep apnea) 02/27/2016   Class 1 obesity with serious comorbidity and body mass index (BMI) of 32.0 to 32.9 in adult 10/23/2015   Bruxism 10/23/2015   Elevated alkaline phosphatase level 05/16/2015   Atypical chest pain 11/10/2014   Acne 05/12/2014   Diabetes mellitus ([place]) 05/12/2014   GERD (gastroesophageal reflux disease) 03/10/2014   Generalized anxiety disorder 03/10/2014   Migraines 03/10/2014   EUSTACHIAN TUBE DYSFUNCTION,  CHRONIC 06/14/2010   Irritable bowel syndrome 12/15/2008   Low back pain 08/11/2008   Hyperlipidemia associated with type 2 diabetes mellitus ([place]) 02/09/2008   Allergic rhinitis 02/12/2007   Asthma 02/12/2007    Scot Jun, PTA 06/21/2021, 3:59 PM  [facility]. [place], [place], [address] Phone: [phone]   Fax:  [phone]  Name: [name] MRN: [id number] Date of Birth: [birth year]

## 2021-06-25 ENCOUNTER — Other Ambulatory Visit: Payer: Self-pay | Admitting: Family Medicine

## 2021-06-28 ENCOUNTER — Encounter (INDEPENDENT_AMBULATORY_CARE_PROVIDER_SITE_OTHER): Payer: Self-pay | Admitting: Physician Assistant

## 2021-06-28 ENCOUNTER — Ambulatory Visit (INDEPENDENT_AMBULATORY_CARE_PROVIDER_SITE_OTHER): Payer: Medicare PPO | Admitting: Physician Assistant

## 2021-06-28 ENCOUNTER — Other Ambulatory Visit: Payer: Self-pay

## 2021-06-28 VITALS — BP 124/71 | HR 80 | Temp 98.3°F | Ht 66.0 in | Wt 197.0 lb

## 2021-06-28 DIAGNOSIS — E119 Type 2 diabetes mellitus without complications: Secondary | ICD-10-CM | POA: Diagnosis not present

## 2021-06-28 DIAGNOSIS — E669 Obesity, unspecified: Secondary | ICD-10-CM

## 2021-06-28 DIAGNOSIS — Z6833 Body mass index (BMI) 33.0-33.9, adult: Secondary | ICD-10-CM | POA: Diagnosis not present

## 2021-07-02 NOTE — Progress Notes (Signed)
Chief Complaint:   OBESITY [name] is here to discuss her progress with her obesity treatment plan along with follow-up of her obesity related diagnoses. [name] is on Category 3 and keeping a food journal and adhering to recommended goals of 1300-1400 calories and 100 grams of protein and states she is following her eating plan approximately 80% of the time. [name] states she is doing physical therapy and weights for 45 minutes 2 times per week.  Today's visit was #: 69 Starting weight: 220 lbs Starting date: 01/19/2018 Today's weight: 197 lbs Today's date: 06/28/2021 Total lbs lost to date: 23 lbs Total lbs lost since last in-office visit: 0  Interim History: [name] reports overindulging over the holidays. She has been back on track for the last few days. She has not been journaling. She is ready to get back on plan.   Subjective:   1. Controlled type 2 diabetes mellitus without complication, without long-term current use of insulin (HCC) [name]'s last A1C was 6.6 and well controlled. She is on Metformin. Her hunger is controlled. Her fasting blood sugar was in the range of 130-140.  Assessment/Plan:   1. Controlled type 2 diabetes mellitus without complication, without long-term current use of insulin ([place]) [name] will continue taking Metformin and she will continue the plan. Good blood sugar control is important to decrease the likelihood of diabetic complications such as nephropathy, neuropathy, limb loss, blindness, coronary artery disease, and death. Intensive lifestyle modification including diet, exercise and weight loss are the first line of treatment for diabetes.   2. Obesity with current BMI 31.81 [name] is currently in the action stage of change. As such, her goal is to continue with weight loss efforts. She has agreed to keeping a food journal and adhering to recommended goals of 1300-1400 calories and 100 grams of protein daily.  Exercise  goals:  As is.  Behavioral modification strategies: planning for success and keeping a strict food journal.  [name] has agreed to follow-up with our clinic in 2 weeks. She was informed of the importance of frequent follow-up visits to maximize her success with intensive lifestyle modifications for her multiple health conditions.   Objective:   Blood pressure 124/71, pulse 80, temperature 98.3 F (36.8 C), height 5\' 6"  (1.676 m), weight 197 lb (89.4 kg), SpO2 99 %. Body mass index is 31.8 kg/m.  General: Cooperative, alert, well developed, in no acute distress. HEENT: Conjunctivae and lids unremarkable. Cardiovascular: Regular rhythm.  Lungs: Normal work of breathing. Neurologic: No focal deficits.   Lab Results  Component Value Date   CREATININE 0.63 06/05/2021   BUN 16 06/05/2021   NA 135 06/05/2021   K 5.2 06/05/2021   CL 97 06/05/2021   CO2 22 06/05/2021   Lab Results  Component Value Date   ALT 15 06/05/2021   AST 27 06/05/2021   ALKPHOS 251 (H) 06/05/2021   BILITOT 0.4 06/05/2021   Lab Results  Component Value Date   HGBA1C 6.6 (H) 06/05/2021   HGBA1C 6.5 (H) 02/09/2021   HGBA1C 6.3 (H) 01/25/2021   HGBA1C 5.7 (H) 04/20/2020   HGBA1C 5.9 (A) 12/09/2019   Lab Results  Component Value Date   INSULIN 12.9 06/05/2021   INSULIN 7.7 01/25/2021   INSULIN 13.1 04/20/2020   INSULIN 12.1 01/19/2018   Lab Results  Component Value Date   TSH 3.38 06/04/2018   Lab Results  Component Value Date   CHOL 144 06/05/2021   HDL 64 06/05/2021   LDLCALC  65 06/05/2021   LDLDIRECT 58.0 11/25/2017   TRIG 75 06/05/2021   CHOLHDL 2.3 06/05/2021   Lab Results  Component Value Date   VD25OH 70.1 06/05/2021   VD25OH 74.5 01/25/2021   VD25OH 66.5 04/20/2020   Lab Results  Component Value Date   WBC 11.5 (H) 02/09/2021   HGB 12.9 02/09/2021   HCT 41.1 02/09/2021   MCV 87.1 02/09/2021   PLT 401 (H) 02/09/2021   No results found for: IRON, TIBC, FERRITIN  Obesity  Behavioral Intervention:   Approximately 15 minutes were spent on the discussion below.  ASK: We discussed the diagnosis of obesity with [name] today and [name] agreed to give [name] permission to discuss obesity behavioral modification therapy today.  ASSESS: [name] has the diagnosis of obesity and her BMI today is 31.9. [name] is in the action stage of change.   ADVISE: [name] was educated on the multiple health risks of obesity as well as the benefit of weight loss to improve her health. She was advised of the need for long term treatment and the importance of lifestyle modifications to improve her current health and to decrease her risk of future health problems.  AGREE: Multiple dietary modification options and treatment options were discussed and [name] agreed to follow the recommendations documented in the above note.  ARRANGE: [name] was educated on the importance of frequent visits to treat obesity as outlined per CMS and USPSTF guidelines and agreed to schedule her next follow up appointment today.  Attestation Statements:   Reviewed by clinician on day of visit: allergies, medications, problem list, medical history, surgical history, family history, social history, and previous encounter notes.   I, Tonye Pearson, am acting as Location manager for [employer], PA-C.  I have reviewed the above documentation for accuracy and completeness, and I agree with the above. [name], PA-C

## 2021-07-03 ENCOUNTER — Encounter: Payer: Self-pay | Admitting: Physical Therapy

## 2021-07-03 ENCOUNTER — Ambulatory Visit: Payer: Medicare PPO | Attending: Orthopedic Surgery | Admitting: Physical Therapy

## 2021-07-03 ENCOUNTER — Other Ambulatory Visit: Payer: Self-pay

## 2021-07-03 DIAGNOSIS — M25612 Stiffness of left shoulder, not elsewhere classified: Secondary | ICD-10-CM | POA: Insufficient documentation

## 2021-07-03 DIAGNOSIS — M25512 Pain in left shoulder: Secondary | ICD-10-CM | POA: Insufficient documentation

## 2021-07-03 DIAGNOSIS — R600 Localized edema: Secondary | ICD-10-CM | POA: Insufficient documentation

## 2021-07-03 DIAGNOSIS — Z96612 Presence of left artificial shoulder joint: Secondary | ICD-10-CM | POA: Insufficient documentation

## 2021-07-03 NOTE — Therapy (Signed)
[address] [place], [place], [address] Phone: [phone]   Fax:  [phone]  Physical Therapy Treatment  Patient Details  Name: [name] MRN: [id number] Date of Birth: 10-03-1950 Referring Provider (PT): Mardelle Matte   Encounter Date: 07/03/2021   PT End of Session - 07/03/21 1619     Visit Number 22    Date for PT Re-Evaluation 07/13/21    Authorization Type [employer]    PT Start Time 1525    PT Stop Time 1622    PT Time Calculation (min) 57 min    Activity Tolerance Patient tolerated treatment well    Behavior During Therapy WFL for tasks assessed/performed             Past Medical History:  Diagnosis Date   Allergy    Anxiety    Arthritis    back & knee   Asthma     mild per pt shows up with resp illness   Colon polyps    Constipation    Diabetes mellitus without complication (HCC)    Gallstones    Gastric polyps    Gastroparesis    GERD (gastroesophageal reflux disease)    Headache    sinus headaches and migraines at times   Heart murmur    History of migraine headaches    HTN (hypertension)    Hyperlipidemia    IBS (irritable bowel syndrome)    Joint pain    Lumbar disc disease    PONV (postoperative nausea and vomiting)    S/P aortic valve replacement with bioprosthetic valve 08/23/2016   25 mm Edwards Intuity rapid-deployment bovine pericardial tissue valve via partial upper mini sternotomy   Sleep apnea    CPAP   TIA (transient ischemic attack)    TIA (transient ischemic attack)    hx of per pt    Past Surgical History:  Procedure Laterality Date   ABDOMINAL HYSTERECTOMY     AORTIC VALVE REPLACEMENT N/A 08/23/2016   Procedure: AORTIC VALVE REPLACEMENT (AVR) - using partial Upper Sternotomy- 49m Edwards Intuity Aortic Valve used;  Surgeon: CRexene Alberts MD;  [facility] [facility]  Service: Open Heart Surgery;  Laterality: N/A;   BLADDER SUSPENSION  2007   BREAST ENHANCEMENT  SURGERY  1980   CARPAL TUNNEL RELEASE Bilateral    COLONOSCOPY     DORSAL COMPARTMENT RELEASE Right 09/15/2014   Procedure: RIGHT WRIST DEQUERVAINS RELEASE ;  Surgeon: DKathryne Hitch MD;  Location[facility]  Service: Orthopedics;  Laterality: Right;   ESOPHAGOGASTRODUODENOSCOPY     EYE SURGERY     cataract surgery   KNEE ARTHROSCOPY  04/15/2012   Procedure: ARTHROSCOPY KNEE;  Surgeon: DNinetta Lights MD;  [facility]  Service: Orthopedics;  Laterality: Right;   LAPAROSCOPIC CHOLECYSTECTOMY     LASIK     OOPHORECTOMY     PALATE TO GINGIVA GRAFT  2017   REVERSE SHOULDER ARTHROPLASTY Left 02/13/2021   Procedure: REVERSE SHOULDER ARTHROPLASTY;  Surgeon: LMarchia Bond MD;  [facility];  Service: Orthopedics;  Laterality: Left;   RIGHT/LEFT HEART CATH AND CORONARY ANGIOGRAPHY N/A 08/02/2016   Procedure: Right/Left Heart Cath and Coronary Angiography;  Surgeon: MSherren Mocha MD;  [facility];  Service: Cardiovascular;  Laterality: N/A;   ROTATOR CUFF REPAIR Left    TEE WITHOUT CARDIOVERSION N/A 08/23/2016   Procedure: TRANSESOPHAGEAL ECHOCARDIOGRAM (TEE);  Surgeon: CRexene Alberts MD;  Location[facility]  Service:  Open Heart Surgery;  Laterality: N/A;   TOTAL KNEE ARTHROPLASTY  04/15/2012   Procedure: TOTAL KNEE ARTHROPLASTY;  Surgeon: [name], MD;  Location: [facility];  Service: Orthopedics;  Laterality: Right;   TRIGGER FINGER RELEASE Right 04/20/2015   Procedure: RIGHT TRIGGER FINGER RELEASE (TENDON SHEALTH INCISION) ;  Surgeon: [name], MD;  Location: [facility];  Service: Orthopedics;  Laterality: Right;    There were no vitals filed for this visit.   Subjective Assessment - 07/03/21 1533     Subjective Doing okay, I still struggle with behind my back and over head    Currently in Pain? Yes    Pain Score 2     Pain Location Shoulder    Pain Orientation Left    Pain Descriptors / Indicators Sore    Aggravating  Factors  reaching back                [facility] PT Assessment - 07/03/21 0001       AROM   Left Shoulder Flexion 125 Degrees    Left Shoulder ABduction 110 Degrees    Left Shoulder Internal Rotation 65 Degrees    Left Shoulder External Rotation 52 Degrees                           [facility] Adult PT Treatment/Exercise - 07/03/21 0001       Shoulder Exercises: Seated   External Rotation Left;20 reps;Weights    External Rotation Weight (lbs) 1#    External Rotation Limitations then worked on Nurse, mental health, also did 2# eccentrics    Flexion Left;20 reps    Flexion Limitations worked on the end range with 1# and 2# and then did eccentrics      Shoulder Exercises: ROM/Strengthening   UBE (Upper Arm Bike) L3.5  x3 min each    Nustep L5 x 5 min      Vasopneumatic   Number Minutes Vasopneumatic  10 minutes    Vasopnuematic Location  Shoulder    Vasopneumatic Pressure Medium      Manual Therapy   Soft tissue mobilization to the teres on the left with her in supine and with her in side lying, some mobilization of the scapula in left sidelying    Passive ROM PROM to end range a lot of cues to relax and when doing flexion as she tends to wing out, all motions with her sitting                       PT Short Term Goals - 04/16/21 1510       PT SHORT TERM GOAL #1   Title independent with intial HEP    Status Achieved               PT Long Term Goals - 07/03/21 1621       PT LONG TERM GOAL #1   Title decrease pain 50%    Status Partially Met      PT LONG TERM GOAL #2   Title returns safely to the gym    Status Partially Met      PT LONG TERM GOAL #3   Title increase AROM of the left shoulder to 120 degrees flexion in standing    Status Partially Met                   Plan - 07/03/21 1619  Clinical Impression Statement Patient good increase in AROM of the left shoulder, I can passively get her to close to a ROM WFL's, she was  very tight in the teres so I did a lot of STM on this today, she is contemplating if she can go to the gym pon her own and be safe.  She needs the end range of motions so I did more eccentrically    PT Next Visit Plan work on her getting to the gym safely    Consulted and Agree with Plan of Care Patient             Patient will benefit from skilled therapeutic intervention in order to improve the following deficits and impairments:  Decreased range of motion, Impaired UE functional use, Increased muscle spasms, Pain, Improper body mechanics, Postural dysfunction, Increased edema, Decreased strength, Decreased mobility  Visit Diagnosis: Acute pain of left shoulder  Stiffness of left shoulder, not elsewhere classified  S/P reverse total shoulder arthroplasty, left  Localized edema     Problem List Patient Active Problem List   Diagnosis Date Noted   S/P reverse total shoulder arthroplasty, left 02/13/2021   Idiopathic hyperphosphatasia 08/03/2019   Vitamin D deficiency 06/04/2018   Hyponatremia 02/17/2018   History of CVA in adulthood 08/13/2017   Blurry vision 08/13/2017   Hypertension associated with diabetes ([place]) 01/02/2017   S/P minimally invasive aortic valve replacement with bioprosthetic valve 41/14/6431   Diastolic dysfunction    Osteopenia 05/27/2016   OSA (obstructive sleep apnea) 02/27/2016   Class 1 obesity with serious comorbidity and body mass index (BMI) of 32.0 to 32.9 in adult 10/23/2015   Bruxism 10/23/2015   Elevated alkaline phosphatase level 05/16/2015   Atypical chest pain 11/10/2014   Acne 05/12/2014   Diabetes mellitus ([place]) 05/12/2014   GERD (gastroesophageal reflux disease) 03/10/2014   Generalized anxiety disorder 03/10/2014   Migraines 03/10/2014   EUSTACHIAN TUBE DYSFUNCTION, CHRONIC 06/14/2010   Irritable bowel syndrome 12/15/2008   Low back pain 08/11/2008   Hyperlipidemia associated with type 2 diabetes mellitus ([place]) 02/09/2008    Allergic rhinitis 02/12/2007   Asthma 02/12/2007    Sumner Boast, PT 07/03/2021, 4:22 PM  [address] [place], [place], [address] Phone: [phone]   Fax:  [phone]  Name: JAIYAH BEINING MRN: [id number] Date of Birth: 25-Nov-1950

## 2021-07-05 ENCOUNTER — Ambulatory Visit: Payer: Medicare PPO | Admitting: Physical Therapy

## 2021-07-05 ENCOUNTER — Other Ambulatory Visit: Payer: Self-pay

## 2021-07-05 ENCOUNTER — Encounter: Payer: Self-pay | Admitting: Physical Therapy

## 2021-07-05 DIAGNOSIS — Z96612 Presence of left artificial shoulder joint: Secondary | ICD-10-CM | POA: Diagnosis not present

## 2021-07-05 DIAGNOSIS — R600 Localized edema: Secondary | ICD-10-CM | POA: Diagnosis not present

## 2021-07-05 DIAGNOSIS — M25512 Pain in left shoulder: Secondary | ICD-10-CM | POA: Diagnosis not present

## 2021-07-05 DIAGNOSIS — M25612 Stiffness of left shoulder, not elsewhere classified: Secondary | ICD-10-CM | POA: Diagnosis not present

## 2021-07-05 NOTE — Therapy (Signed)
[address] [place], [place], [address] Phone: [phone]   Fax:  [phone]  Physical Therapy Treatment  Patient Details  Name: [name] MRN: [id number] Date of Birth: 05-May-1951 Referring Provider (PT): Mardelle Matte   Encounter Date: 07/05/2021   PT End of Session - 07/05/21 2426     Visit Number 23    Date for PT Re-Evaluation 07/13/21    PT Start Time 1515    PT Stop Time 1609    PT Time Calculation (min) 54 min    Activity Tolerance Patient tolerated treatment well    Behavior During Therapy [facility] for tasks assessed/performed             Past Medical History:  Diagnosis Date   Allergy    Anxiety    Arthritis    back & knee   Asthma     mild per pt shows up with resp illness   Colon polyps    Constipation    Diabetes mellitus without complication (HCC)    Gallstones    Gastric polyps    Gastroparesis    GERD (gastroesophageal reflux disease)    Headache    sinus headaches and migraines at times   Heart murmur    History of migraine headaches    HTN (hypertension)    Hyperlipidemia    IBS (irritable bowel syndrome)    Joint pain    Lumbar disc disease    PONV (postoperative nausea and vomiting)    S/P aortic valve replacement with bioprosthetic valve 08/23/2016   25 mm Edwards Intuity rapid-deployment bovine pericardial tissue valve via partial upper mini sternotomy   Sleep apnea    CPAP   TIA (transient ischemic attack)    TIA (transient ischemic attack)    hx of per pt    Past Surgical History:  Procedure Laterality Date   ABDOMINAL HYSTERECTOMY     AORTIC VALVE REPLACEMENT N/A 08/23/2016   Procedure: AORTIC VALVE REPLACEMENT (AVR) - using partial Upper Sternotomy- 76m Edwards Intuity Aortic Valve used;  Surgeon: CRexene Alberts MD;  [facility]  Service: Open Heart Surgery;  Laterality: N/A;   BLADDER SUSPENSION  2007   BREAST ENHANCEMENT SURGERY  1980   CARPAL TUNNEL  RELEASE Bilateral    COLONOSCOPY     DORSAL COMPARTMENT RELEASE Right 09/15/2014   Procedure: RIGHT WRIST DEQUERVAINS RELEASE ;  Surgeon: DKathryne Hitch MD;  [facility]  Service: Orthopedics;  Laterality: Right;   ESOPHAGOGASTRODUODENOSCOPY     EYE SURGERY     cataract surgery   KNEE ARTHROSCOPY  04/15/2012   Procedure: ARTHROSCOPY KNEE;  Surgeon: DNinetta Lights MD;  [facility]  Service: Orthopedics;  Laterality: Right;   LAPAROSCOPIC CHOLECYSTECTOMY     LASIK     OOPHORECTOMY     PALATE TO GINGIVA GRAFT  2017   REVERSE SHOULDER ARTHROPLASTY Left 02/13/2021   Procedure: REVERSE SHOULDER ARTHROPLASTY;  Surgeon: LMarchia Bond MD;  [facility];  Service: Orthopedics;  Laterality: Left;   RIGHT/LEFT HEART CATH AND CORONARY ANGIOGRAPHY N/A 08/02/2016   Procedure: Right/Left Heart Cath and Coronary Angiography;  Surgeon: MSherren Mocha MD;  [facility];  Service: Cardiovascular;  Laterality: N/A;   ROTATOR CUFF REPAIR Left    TEE WITHOUT CARDIOVERSION N/A 08/23/2016   Procedure: TRANSESOPHAGEAL ECHOCARDIOGRAM (TEE);  Surgeon: CRexene Alberts MD;  [facility]  Service: Open Heart Surgery;  Laterality: N/A;  TOTAL KNEE ARTHROPLASTY  04/15/2012   Procedure: TOTAL KNEE ARTHROPLASTY;  Surgeon: [name], MD;  Location: [place];  Service: Orthopedics;  Laterality: Right;   TRIGGER FINGER RELEASE Right 04/20/2015   Procedure: RIGHT TRIGGER FINGER RELEASE (TENDON SHEALTH INCISION) ;  Surgeon: [name], MD;  Location: [place];  Service: Orthopedics;  Laterality: Right;    There were no vitals filed for this visit.   Subjective Assessment - 07/05/21 1514     Subjective Some pain with L shoulder flexion and extension at times.    Currently in Pain? Yes    Pain Score 1     Pain Location Shoulder    Pain Orientation Left                               [facility] Adult PT Treatment/Exercise -  07/05/21 0001       Shoulder Exercises: Standing   External Rotation Both;20 reps;Theraband   Eccentric PTA assist   Theraband Level (Shoulder External Rotation) Level 2 (Red)    Internal Rotation Strengthening;Left;Theraband;20 reps    Theraband Level (Shoulder Internal Rotation) Level 2 (Red)    Extension Strengthening;Both;20 reps;Weights    Extension Weight (lbs) 5    Other Standing Exercises Bilat UE flexion form 90 to full 2x10    Other Standing Exercises cabinet reaching 2# 2x10 flex & abd, Level 1 to 2 towards end range      Shoulder Exercises: ROM/Strengthening   UBE (Upper Arm Bike) L3.5  x3 min each    Other ROM/Strengthening Exercises Rows & Lats 25lb 2x10      Vasopneumatic   Number Minutes Vasopneumatic  15 minutes    Vasopnuematic Location  Shoulder    Vasopneumatic Pressure Medium    Vasopneumatic Temperature  34      Manual Therapy   Manual Therapy Soft tissue mobilization;Passive ROM    Soft tissue mobilization to the teres on the left with her in supine and with her in side lying, some mobilization of the scapula in left sidelying    Passive ROM PROM to end range a lot of cues to relax and when doing flexion as she tends to wing out, all motions with her sitting                       PT Short Term Goals - 04/16/21 1510       PT SHORT TERM GOAL #1   Title independent with intial HEP    Status Achieved               PT Long Term Goals - 07/03/21 1621       PT LONG TERM GOAL #1   Title decrease pain 50%    Status Partially Met      PT LONG TERM GOAL #2   Title returns safely to the gym    Status Partially Met      PT LONG TERM GOAL #3   Title increase AROM of the left shoulder to 120 degrees flexion in standing    Status Partially Met                   Plan - 07/05/21 1554     Clinical Impression Statement Pt continues to do well in therapy progressing with strength and ROM. Increase resistance tolerated with seated  rows and lats. Some increase fatigue with shoulder  ROM from 90 to end range with strengthening. Increase ROM from scapular anchoring with passive flexion and scaption.    Stability/Clinical Decision Making Evolving/Moderate complexity    Rehab Potential Good    PT Frequency 2x / week    PT Duration 12 weeks    PT Treatment/Interventions ADLs/Self Care Home Management;Cryotherapy;Electrical Stimulation;Therapeutic activities;Therapeutic exercise;Neuromuscular re-education;Manual techniques;Patient/family education;Cognitive remediation;Vasopneumatic Device    PT Next Visit Plan work on her getting to the gym safely             Patient will benefit from skilled therapeutic intervention in order to improve the following deficits and impairments:  Decreased range of motion, Impaired UE functional use, Increased muscle spasms, Pain, Improper body mechanics, Postural dysfunction, Increased edema, Decreased strength, Decreased mobility  Visit Diagnosis: Acute pain of left shoulder  Stiffness of left shoulder, not elsewhere classified  S/P reverse total shoulder arthroplasty, left  Localized edema     Problem List Patient Active Problem List   Diagnosis Date Noted   S/P reverse total shoulder arthroplasty, left 02/13/2021   Idiopathic hyperphosphatasia 08/03/2019   Vitamin D deficiency 06/04/2018   Hyponatremia 02/17/2018   History of CVA in adulthood 08/13/2017   Blurry vision 08/13/2017   Hypertension associated with diabetes ([place]) 01/02/2017   S/P minimally invasive aortic valve replacement with bioprosthetic valve 13/01/6577   Diastolic dysfunction    Osteopenia 05/27/2016   OSA (obstructive sleep apnea) 02/27/2016   Class 1 obesity with serious comorbidity and body mass index (BMI) of 32.0 to 32.9 in adult 10/23/2015   Bruxism 10/23/2015   Elevated alkaline phosphatase level 05/16/2015   Atypical chest pain 11/10/2014   Acne 05/12/2014   Diabetes mellitus ([place]) 05/12/2014    GERD (gastroesophageal reflux disease) 03/10/2014   Generalized anxiety disorder 03/10/2014   Migraines 03/10/2014   EUSTACHIAN TUBE DYSFUNCTION, CHRONIC 06/14/2010   Irritable bowel syndrome 12/15/2008   Low back pain 08/11/2008   Hyperlipidemia associated with type 2 diabetes mellitus ([place]) 02/09/2008   Allergic rhinitis 02/12/2007   Asthma 02/12/2007    Scot Jun, PTA 07/05/2021, 4:01 PM  [address]. [place], [place], [address] Phone: [phone]   Fax:  [phone]  Name: [name] MRN: [id number] Date of Birth: 1951-03-06

## 2021-07-10 ENCOUNTER — Ambulatory Visit: Payer: Medicare PPO | Admitting: Physical Therapy

## 2021-07-10 ENCOUNTER — Other Ambulatory Visit: Payer: Self-pay

## 2021-07-10 DIAGNOSIS — Z96612 Presence of left artificial shoulder joint: Secondary | ICD-10-CM | POA: Diagnosis not present

## 2021-07-10 DIAGNOSIS — M25512 Pain in left shoulder: Secondary | ICD-10-CM | POA: Diagnosis not present

## 2021-07-10 DIAGNOSIS — R600 Localized edema: Secondary | ICD-10-CM | POA: Diagnosis not present

## 2021-07-10 DIAGNOSIS — M25612 Stiffness of left shoulder, not elsewhere classified: Secondary | ICD-10-CM

## 2021-07-10 NOTE — Therapy (Signed)
[place] [place], [place], [address] Phone: [phone]   Fax:  [phone]  Physical Therapy Treatment  Patient Details  Name: [name] MRN: [id number] Date of Birth: [birth year] Referring Provider (PT): Mardelle Matte   Encounter Date: 07/10/2021   PT End of Session - 07/10/21 1603     Visit Number 24    Number of Visits 25    Date for PT Re-Evaluation 07/13/21    Authorization Type [employer]    PT Start Time 1530    PT Stop Time 1620    PT Time Calculation (min) 50 min             Past Medical History:  Diagnosis Date   Allergy    Anxiety    Arthritis    back & knee   Asthma     mild per pt shows up with resp illness   Colon polyps    Constipation    Diabetes mellitus without complication (HCC)    Gallstones    Gastric polyps    Gastroparesis    GERD (gastroesophageal reflux disease)    Headache    sinus headaches and migraines at times   Heart murmur    History of migraine headaches    HTN (hypertension)    Hyperlipidemia    IBS (irritable bowel syndrome)    Joint pain    Lumbar disc disease    PONV (postoperative nausea and vomiting)    S/P aortic valve replacement with bioprosthetic valve 08/23/2016   25 mm Edwards Intuity rapid-deployment bovine pericardial tissue valve via partial upper mini sternotomy   Sleep apnea    CPAP   TIA (transient ischemic attack)    TIA (transient ischemic attack)    hx of per pt    Past Surgical History:  Procedure Laterality Date   ABDOMINAL HYSTERECTOMY     AORTIC VALVE REPLACEMENT N/A 08/23/2016   Procedure: AORTIC VALVE REPLACEMENT (AVR) - using partial Upper Sternotomy- 51m Edwards Intuity Aortic Valve used;  Surgeon: CRexene Alberts MD;  [facility]  Service: Open Heart Surgery;  Laterality: N/A;   BLADDER SUSPENSION  2007   BREAST ENHANCEMENT SURGERY  1980   CARPAL TUNNEL RELEASE Bilateral    COLONOSCOPY     DORSAL COMPARTMENT  RELEASE Right 09/15/2014   Procedure: RIGHT WRIST DEQUERVAINS RELEASE ;  Surgeon: DKathryne Hitch MD;  [facility]  Service: Orthopedics;  Laterality: Right;   ESOPHAGOGASTRODUODENOSCOPY     EYE SURGERY     cataract surgery   KNEE ARTHROSCOPY  04/15/2012   Procedure: ARTHROSCOPY KNEE;  Surgeon: DNinetta Lights MD;  [facility]  Service: Orthopedics;  Laterality: Right;   LAPAROSCOPIC CHOLECYSTECTOMY     LASIK     OOPHORECTOMY     PALATE TO GINGIVA GRAFT  2017   REVERSE SHOULDER ARTHROPLASTY Left 02/13/2021   Procedure: REVERSE SHOULDER ARTHROPLASTY;  Surgeon: LMarchia Bond MD;  [facility];  Service: Orthopedics;  Laterality: Left;   RIGHT/LEFT HEART CATH AND CORONARY ANGIOGRAPHY N/A 08/02/2016   Procedure: Right/Left Heart Cath and Coronary Angiography;  Surgeon: MSherren Mocha MD;  [facility];  Service: Cardiovascular;  Laterality: N/A;   ROTATOR CUFF REPAIR Left    TEE WITHOUT CARDIOVERSION N/A 08/23/2016   Procedure: TRANSESOPHAGEAL ECHOCARDIOGRAM (TEE);  Surgeon: CRexene Alberts MD;  [facility]  Service: Open Heart Surgery;  Laterality: N/A;   TOTAL KNEE ARTHROPLASTY  04/15/2012   Procedure: TOTAL KNEE ARTHROPLASTY;  Surgeon: [name], MD;  Location: [facility];  Service: Orthopedics;  Laterality: Right;   TRIGGER FINGER RELEASE Right 04/20/2015   Procedure: RIGHT TRIGGER FINGER RELEASE (TENDON SHEALTH INCISION) ;  Surgeon: [name], MD;  Location: [facility];  Service: Orthopedics;  Laterality: Right;    There were no vitals filed for this visit.   Subjective Assessment - 07/10/21 1526     Subjective more soreness than pain. need MD note next visit. also need ex I can do at gym as Thursday maybe last day    Currently in Pain? Yes    Pain Score 1     Pain Location Shoulder    Pain Orientation Left                               [facility] Adult PT Treatment/Exercise - 07/10/21 0001        Shoulder Exercises: Standing   External Rotation Both;20 reps;Theraband    Theraband Level (Shoulder External Rotation) Level 2 (Red)    Internal Rotation Strengthening;Left;Theraband;20 reps    Theraband Level (Shoulder Internal Rotation) Level 2 (Red)    Extension Strengthening;Both;20 reps;Weights    Theraband Level (Shoulder Extension) Level 2 (Red)    Row Strengthening;Both;20 reps    Theraband Level (Shoulder Row) Level 2 (Red)    Other Standing Exercises wt bar 3# shld ext and IR 2 sets 10      Shoulder Exercises: ROM/Strengthening   UBE (Upper Arm Bike) L 4 3 min fwd/3 min backward    Lat Pull 20 reps   20#   Cybex Press 2 plate;20 reps    Cybex Row 20 reps   20#     Vasopneumatic   Number Minutes Vasopneumatic  15 minutes    Vasopnuematic Location  Shoulder    Vasopneumatic Pressure Medium    Vasopneumatic Temperature  34      Manual Therapy   Manual Therapy Soft tissue mobilization;Passive ROM    Soft tissue mobilization to the teres on the left with her in supine and with her in side lying, some mobilization of the scapula in left sidelying    Passive ROM PROM to end range ER most limited                       PT Short Term Goals - 04/16/21 1510       PT SHORT TERM GOAL #1   Title independent with intial HEP    Status Achieved               PT Long Term Goals - 07/03/21 1621       PT LONG TERM GOAL #1   Title decrease pain 50%    Status Partially Met      PT LONG TERM GOAL #2   Title returns safely to the gym    Status Partially Met      PT LONG TERM GOAL #3   Title increase AROM of the left shoulder to 120 degrees flexion in standing    Status Partially Met                   Plan - 07/10/21 1603     Clinical Impression Statement progressed with machine interventions as pt wants to start at gym next week. cuing with ex to decrease compensations. end range tightness  and ER most liited with multi TP    PT  Treatment/Interventions ADLs/Self Care Home Management;Cryotherapy;Electrical Stimulation;Therapeutic activities;Therapeutic exercise;Neuromuscular re-education;Manual techniques;Patient/family education;Cognitive remediation;Vasopneumatic Device    PT Next Visit Plan check ROM,goals and write MD note, gym ex. HOLD             Patient will benefit from skilled therapeutic intervention in order to improve the following deficits and impairments:  Decreased range of motion, Impaired UE functional use, Increased muscle spasms, Pain, Improper body mechanics, Postural dysfunction, Increased edema, Decreased strength, Decreased mobility  Visit Diagnosis: Stiffness of left shoulder, not elsewhere classified  Localized edema     Problem List Patient Active Problem List   Diagnosis Date Noted   S/P reverse total shoulder arthroplasty, left 02/13/2021   Idiopathic hyperphosphatasia 08/03/2019   Vitamin D deficiency 06/04/2018   Hyponatremia 02/17/2018   History of CVA in adulthood 08/13/2017   Blurry vision 08/13/2017   Hypertension associated with diabetes ([place]) 01/02/2017   S/P minimally invasive aortic valve replacement with bioprosthetic valve 84/69/6295   Diastolic dysfunction    Osteopenia 05/27/2016   OSA (obstructive sleep apnea) 02/27/2016   Class 1 obesity with serious comorbidity and body mass index (BMI) of 32.0 to 32.9 in adult 10/23/2015   Bruxism 10/23/2015   Elevated alkaline phosphatase level 05/16/2015   Atypical chest pain 11/10/2014   Acne 05/12/2014   Diabetes mellitus ([place]) 05/12/2014   GERD (gastroesophageal reflux disease) 03/10/2014   Generalized anxiety disorder 03/10/2014   Migraines 03/10/2014   EUSTACHIAN TUBE DYSFUNCTION, CHRONIC 06/14/2010   Irritable bowel syndrome 12/15/2008   Low back pain 08/11/2008   Hyperlipidemia associated with type 2 diabetes mellitus ([place]) 02/09/2008   Allergic rhinitis 02/12/2007   Asthma 02/12/2007    Kilee Hedding,ANGIE,  PTA 07/10/2021, 4:07 PM  [place] [place], [place], [address] Phone: [phone]   Fax:  [phone]  Name: BRIZZA NATHANSON MRN: [id number] Date of Birth: Aug 14, 1950

## 2021-07-12 ENCOUNTER — Other Ambulatory Visit: Payer: Self-pay

## 2021-07-12 ENCOUNTER — Ambulatory Visit (INDEPENDENT_AMBULATORY_CARE_PROVIDER_SITE_OTHER): Payer: Medicare PPO | Admitting: Physician Assistant

## 2021-07-12 ENCOUNTER — Ambulatory Visit: Payer: Medicare PPO | Admitting: Physical Therapy

## 2021-07-12 ENCOUNTER — Encounter (INDEPENDENT_AMBULATORY_CARE_PROVIDER_SITE_OTHER): Payer: Self-pay | Admitting: Physician Assistant

## 2021-07-12 VITALS — BP 117/70 | HR 70 | Temp 98.6°F | Ht 66.0 in | Wt 193.0 lb

## 2021-07-12 DIAGNOSIS — Z6831 Body mass index (BMI) 31.0-31.9, adult: Secondary | ICD-10-CM | POA: Diagnosis not present

## 2021-07-12 DIAGNOSIS — E669 Obesity, unspecified: Secondary | ICD-10-CM

## 2021-07-12 DIAGNOSIS — Z6833 Body mass index (BMI) 33.0-33.9, adult: Secondary | ICD-10-CM

## 2021-07-12 DIAGNOSIS — E119 Type 2 diabetes mellitus without complications: Secondary | ICD-10-CM | POA: Diagnosis not present

## 2021-07-12 DIAGNOSIS — R600 Localized edema: Secondary | ICD-10-CM | POA: Diagnosis not present

## 2021-07-12 DIAGNOSIS — M25612 Stiffness of left shoulder, not elsewhere classified: Secondary | ICD-10-CM | POA: Diagnosis not present

## 2021-07-12 DIAGNOSIS — Z96612 Presence of left artificial shoulder joint: Secondary | ICD-10-CM | POA: Diagnosis not present

## 2021-07-12 DIAGNOSIS — M25512 Pain in left shoulder: Secondary | ICD-10-CM | POA: Diagnosis not present

## 2021-07-12 NOTE — Progress Notes (Signed)
Chief Complaint:   OBESITY Jazara is here to discuss her progress with her obesity treatment plan along with follow-up of her obesity related diagnoses. Kita is on keeping a food journal and adhering to recommended goals of 1300-1400 calories and 100 grams of protein and states she is following her eating plan approximately 90% of the time. Harlei states she is doing physical therapy for her shoulder for 45 minutes 2 times per week.  Today's visit was #: 58 Starting weight: 220 lbs Starting date: 01/19/2018 Today's weight: 194 lbs Today's date: 07/12/2021 Total lbs lost to date: 26 lbs Total lbs lost since last in-office visit: 3 lbs  Interim History: Soraida did very well with getting back on track with her plan. She is journaling more often but sometimes forgets. She wants to start walking more often. Walking at home video info given today.  Subjective:   1. Controlled type 2 diabetes mellitus without complication, without long-term current use of insulin (HCC) Rogan's last A1C was 6.6. Her Type 2 diabetes mellitus is well controlled. She is on Metformin daily. Her last blood sugar was in the range of 120-150 in August 2022.  Assessment/Plan:   1. Controlled type 2 diabetes mellitus without complication, without long-term current use of insulin ([place]) Emali will continue medication and plan. Good blood sugar control is important to decrease the likelihood of diabetic complications such as nephropathy, neuropathy, limb loss, blindness, coronary artery disease, and death. Intensive lifestyle modification including diet, exercise and weight loss are the first line of treatment for diabetes.   2. Obesity with current BMI 31.17 Annalissa is currently in the action stage of change. As such, her goal is to continue with weight loss efforts. She has agreed to keeping a food journal and adhering to recommended goals of 1300-1400 calories and 100 grams of protein  daily.   Exercise goals:  As is.  Behavioral modification strategies: meal planning and cooking strategies, planning for success, and keeping a strict food journal.  Nafisah has agreed to follow-up with our clinic in 3 weeks. She was informed of the importance of frequent follow-up visits to maximize her success with intensive lifestyle modifications for her multiple health conditions.   Objective:   Blood pressure 117/70, pulse 70, temperature 98.6 F (37 C), temperature source Oral, height 5\' 6"  (1.676 m), weight 193 lb (87.5 kg), SpO2 99 %. Body mass index is 31.15 kg/m.  General: Cooperative, alert, well developed, in no acute distress. HEENT: Conjunctivae and lids unremarkable. Cardiovascular: Regular rhythm.  Lungs: Normal work of breathing. Neurologic: No focal deficits.   Lab Results  Component Value Date   CREATININE 0.63 06/05/2021   BUN 16 06/05/2021   NA 135 06/05/2021   K 5.2 06/05/2021   CL 97 06/05/2021   CO2 22 06/05/2021   Lab Results  Component Value Date   ALT 15 06/05/2021   AST 27 06/05/2021   ALKPHOS 251 (H) 06/05/2021   BILITOT 0.4 06/05/2021   Lab Results  Component Value Date   HGBA1C 6.6 (H) 06/05/2021   HGBA1C 6.5 (H) 02/09/2021   HGBA1C 6.3 (H) 01/25/2021   HGBA1C 5.7 (H) 04/20/2020   HGBA1C 5.9 (A) 12/09/2019   Lab Results  Component Value Date   INSULIN 12.9 06/05/2021   INSULIN 7.7 01/25/2021   INSULIN 13.1 04/20/2020   INSULIN 12.1 01/19/2018   Lab Results  Component Value Date   TSH 3.38 06/04/2018   Lab Results  Component Value Date  CHOL 144 06/05/2021   HDL 64 06/05/2021   LDLCALC 65 06/05/2021   LDLDIRECT 58.0 11/25/2017   TRIG 75 06/05/2021   CHOLHDL 2.3 06/05/2021   Lab Results  Component Value Date   VD25OH 70.1 06/05/2021   VD25OH 74.5 01/25/2021   VD25OH 66.5 04/20/2020   Lab Results  Component Value Date   WBC 11.5 (H) 02/09/2021   HGB 12.9 02/09/2021   HCT 41.1 02/09/2021   MCV 87.1 02/09/2021    PLT 401 (H) 02/09/2021   No results found for: IRON, TIBC, FERRITIN  Obesity Behavioral Intervention:   Approximately 15 minutes were spent on the discussion below.  ASK: We discussed the diagnosis of obesity with [name] today and Majestic agreed to give [name] permission to discuss obesity behavioral modification therapy today.  ASSESS: Tamitha has the diagnosis of obesity and her BMI today is 31.4. Lynnda is in the action stage of change.   ADVISE: Tane was educated on the multiple health risks of obesity as well as the benefit of weight loss to improve her health. She was advised of the need for long term treatment and the importance of lifestyle modifications to improve her current health and to decrease her risk of future health problems.  AGREE: Multiple dietary modification options and treatment options were discussed and Penina agreed to follow the recommendations documented in the above note.  ARRANGE: Brayla was educated on the importance of frequent visits to treat obesity as outlined per CMS and USPSTF guidelines and agreed to schedule her next follow up appointment today.  Attestation Statements:   Reviewed by clinician on day of visit: allergies, medications, problem list, medical history, surgical history, family history, social history, and previous encounter notes.   I, Tonye Pearson, am acting as Location manager for [employer], PA-C.  I have reviewed the above documentation for accuracy and completeness, and I agree with the above. [name], PA-C

## 2021-07-12 NOTE — Therapy (Signed)
[facility] [place], [place], [address] Phone: [phone]   Fax:  [phone]  Physical Therapy Treatment  Patient Details  Name: [name] MRN: [id number] Date of Birth: 08-21-1950 Referring Provider (PT): Mardelle Matte   Encounter Date: 07/12/2021   PT End of Session - 07/12/21 1602     Visit Number 25    Date for PT Re-Evaluation 07/13/21    Authorization Type [employer]    PT Start Time 7681    PT Stop Time 1615    PT Time Calculation (min) 60 min             Past Medical History:  Diagnosis Date   Allergy    Anxiety    Arthritis    back & knee   Asthma     mild per pt shows up with resp illness   Colon polyps    Constipation    Diabetes mellitus without complication (HCC)    Gallstones    Gastric polyps    Gastroparesis    GERD (gastroesophageal reflux disease)    Headache    sinus headaches and migraines at times   Heart murmur    History of migraine headaches    HTN (hypertension)    Hyperlipidemia    IBS (irritable bowel syndrome)    Joint pain    Lumbar disc disease    PONV (postoperative nausea and vomiting)    S/P aortic valve replacement with bioprosthetic valve 08/23/2016   25 mm Edwards Intuity rapid-deployment bovine pericardial tissue valve via partial upper mini sternotomy   Sleep apnea    CPAP   TIA (transient ischemic attack)    TIA (transient ischemic attack)    hx of per pt    Past Surgical History:  Procedure Laterality Date   ABDOMINAL HYSTERECTOMY     AORTIC VALVE REPLACEMENT N/A 08/23/2016   Procedure: AORTIC VALVE REPLACEMENT (AVR) - using partial Upper Sternotomy- 62m Edwards Intuity Aortic Valve used;  Surgeon: CRexene Alberts MD;  [facility]  Service: Open Heart Surgery;  Laterality: N/A;   BLADDER SUSPENSION  2007   BREAST ENHANCEMENT SURGERY  1980   CARPAL TUNNEL RELEASE Bilateral    COLONOSCOPY     DORSAL COMPARTMENT RELEASE Right 09/15/2014    Procedure: RIGHT WRIST DEQUERVAINS RELEASE ;  Surgeon: DKathryne Hitch MD;  [facility]  Service: Orthopedics;  Laterality: Right;   ESOPHAGOGASTRODUODENOSCOPY     EYE SURGERY     cataract surgery   KNEE ARTHROSCOPY  04/15/2012   Procedure: ARTHROSCOPY KNEE;  Surgeon: DNinetta Lights MD;  [facility]  Service: Orthopedics;  Laterality: Right;   LAPAROSCOPIC CHOLECYSTECTOMY     LASIK     OOPHORECTOMY     PALATE TO GINGIVA GRAFT  2017   REVERSE SHOULDER ARTHROPLASTY Left 02/13/2021   Procedure: REVERSE SHOULDER ARTHROPLASTY;  Surgeon: LMarchia Bond MD;  [facility];  Service: Orthopedics;  Laterality: Left;   RIGHT/LEFT HEART CATH AND CORONARY ANGIOGRAPHY N/A 08/02/2016   Procedure: Right/Left Heart Cath and Coronary Angiography;  Surgeon: MSherren Mocha MD;  [facility];  Service: Cardiovascular;  Laterality: N/A;   ROTATOR CUFF REPAIR Left    TEE WITHOUT CARDIOVERSION N/A 08/23/2016   Procedure: TRANSESOPHAGEAL ECHOCARDIOGRAM (TEE);  Surgeon: CRexene Alberts MD;  [facility]  Service: Open Heart Surgery;  Laterality: N/A;   TOTAL KNEE ARTHROPLASTY  04/15/2012   Procedure: TOTAL KNEE ARTHROPLASTY;  Surgeon: [name], MD;  Location: [facility];  Service: Orthopedics;  Laterality: Right;   TRIGGER FINGER RELEASE Right 04/20/2015   Procedure: RIGHT TRIGGER FINGER RELEASE (TENDON SHEALTH INCISION) ;  Surgeon: [name], MD;  Location: [facility];  Service: Orthopedics;  Laterality: Right;    There were no vitals filed for this visit.   Subjective Assessment - 07/12/21 1531     Subjective doing okay- ready to try ex at gym until I see MD 1/30    Currently in Pain? Yes    Pain Score 1     Pain Location Shoulder    Pain Orientation Left                [facility] PT Assessment - 07/12/21 0001       AROM   AROM Assessment Site Shoulder    Right/Left Shoulder Left   STANDING   Left Shoulder Flexion 120  Degrees    Left Shoulder ABduction 110 Degrees    Left Shoulder Internal Rotation 60 Degrees    Left Shoulder External Rotation 30 Degrees                           [facility] Adult PT Treatment/Exercise - 07/12/21 0001       Shoulder Exercises: ROM/Strengthening   UBE (Upper Arm Bike) L 4 3 min fwd/3 min backward    Lat Pull 20 reps   20#   Cybex Press 2 plate;20 reps    Cybex Row 20 reps   20#     Vasopneumatic   Number Minutes Vasopneumatic  15 minutes    Vasopnuematic Location  Shoulder    Vasopneumatic Pressure Medium      Manual Therapy   Manual Therapy Soft tissue mobilization;Passive ROM    Soft tissue mobilization to the teres on the left with her in supine and with her in side lying, some mobilization of the scapula in left sidelying    Passive ROM PROM to end range ER most limited                     PT Education - 07/12/21 1537     Education Details machine interventions for gym.scap stab ex red tband  [id number]    Person(s) Educated Patient    Methods Explanation;Demonstration;Handout    Comprehension Verbalized understanding;Returned demonstration              PT Short Term Goals - 04/16/21 1510       PT SHORT TERM GOAL #1   Title independent with intial HEP    Status Achieved               PT Long Term Goals - 07/12/21 1542       PT LONG TERM GOAL #1   Title decrease pain 50%    Baseline 90% better    Status Achieved      PT LONG TERM GOAL #2   Title returns safely to the gym    Baseline discussed safe gym return and ex machines and increased HEP    Status Partially Met      PT LONG TERM GOAL #3   Title increase AROM of the left shoulder to 120 degrees flexion in standing    Baseline standing act flexion 120 degrees adn then goes into abd    Status Achieved      PT LONG TERM GOAL #4  Title report no difficulty dressing or doing hair    Baseline No issue dressing except hooking bra, doing hair about 90%     Status Partially Met                   Plan - 07/12/21 1602     Clinical Impression Statement focus session on educ for gym equipment and transition to independant ex. issued HEP with tband. ROM and STW to left shld. multi TP. increased func use with less pain . ROM still limited    PT Treatment/Interventions ADLs/Self Care Home Management;Cryotherapy;Electrical Stimulation;Therapeutic activities;Therapeutic exercise;Neuromuscular re-education;Manual techniques;Patient/family education;Cognitive remediation;Vasopneumatic Device    PT Next Visit Plan HOLD and try gym ex prior to seeing MD 1/30. pt may benefit from a couple add'l sessions of PT for DN to help with tender TP and tightness             Patient will benefit from skilled therapeutic intervention in order to improve the following deficits and impairments:  Decreased range of motion, Impaired UE functional use, Increased muscle spasms, Pain, Improper body mechanics, Postural dysfunction, Increased edema, Decreased strength, Decreased mobility  Visit Diagnosis: Stiffness of left shoulder, not elsewhere classified  Localized edema  S/P reverse total shoulder arthroplasty, left     Problem List Patient Active Problem List   Diagnosis Date Noted   S/P reverse total shoulder arthroplasty, left 02/13/2021   Idiopathic hyperphosphatasia 08/03/2019   Vitamin D deficiency 06/04/2018   Hyponatremia 02/17/2018   History of CVA in adulthood 08/13/2017   Blurry vision 08/13/2017   Hypertension associated with diabetes ([place]) 01/02/2017   S/P minimally invasive aortic valve replacement with bioprosthetic valve 32/07/3341   Diastolic dysfunction    Osteopenia 05/27/2016   OSA (obstructive sleep apnea) 02/27/2016   Class 1 obesity with serious comorbidity and body mass index (BMI) of 32.0 to 32.9 in adult 10/23/2015   Bruxism 10/23/2015   Elevated alkaline phosphatase level 05/16/2015   Atypical chest pain 11/10/2014    Acne 05/12/2014   Diabetes mellitus ([place]) 05/12/2014   GERD (gastroesophageal reflux disease) 03/10/2014   Generalized anxiety disorder 03/10/2014   Migraines 03/10/2014   EUSTACHIAN TUBE DYSFUNCTION, CHRONIC 06/14/2010   Irritable bowel syndrome 12/15/2008   Low back pain 08/11/2008   Hyperlipidemia associated with type 2 diabetes mellitus ([place]) 02/09/2008   Allergic rhinitis 02/12/2007   Asthma 02/12/2007    Hammond Obeirne,ANGIE, PTA 07/12/2021, 4:06 PM  [facility]. [place], [place], [address] Phone: [phone]   Fax:  [phone]  Name: [name] MRN: [id number] Date of Birth: December 16, 1950

## 2021-07-23 DIAGNOSIS — S42202D Unspecified fracture of upper end of left humerus, subsequent encounter for fracture with routine healing: Secondary | ICD-10-CM | POA: Diagnosis not present

## 2021-07-25 ENCOUNTER — Other Ambulatory Visit: Payer: Self-pay | Admitting: Family Medicine

## 2021-07-26 ENCOUNTER — Ambulatory Visit: Payer: Medicare PPO | Admitting: Neurology

## 2021-07-29 ENCOUNTER — Other Ambulatory Visit: Payer: Self-pay | Admitting: Family Medicine

## 2021-07-31 ENCOUNTER — Other Ambulatory Visit: Payer: Self-pay

## 2021-07-31 ENCOUNTER — Ambulatory Visit (INDEPENDENT_AMBULATORY_CARE_PROVIDER_SITE_OTHER): Payer: Medicare PPO | Admitting: Physician Assistant

## 2021-07-31 ENCOUNTER — Encounter (INDEPENDENT_AMBULATORY_CARE_PROVIDER_SITE_OTHER): Payer: Self-pay | Admitting: Physician Assistant

## 2021-07-31 VITALS — BP 117/61 | HR 65 | Temp 99.1°F | Ht 66.0 in | Wt 193.0 lb

## 2021-07-31 DIAGNOSIS — E669 Obesity, unspecified: Secondary | ICD-10-CM

## 2021-07-31 DIAGNOSIS — Z6831 Body mass index (BMI) 31.0-31.9, adult: Secondary | ICD-10-CM

## 2021-07-31 DIAGNOSIS — E119 Type 2 diabetes mellitus without complications: Secondary | ICD-10-CM

## 2021-07-31 DIAGNOSIS — Z7984 Long term (current) use of oral hypoglycemic drugs: Secondary | ICD-10-CM | POA: Diagnosis not present

## 2021-07-31 NOTE — Progress Notes (Signed)
Chief Complaint:   OBESITY [name] is here to discuss her progress with her obesity treatment plan along with follow-up of her obesity related diagnoses. [name] is on keeping a food journal and adhering to recommended goals of 1300-1400 calories and 100 grams of protein daily and states she is following her eating plan approximately 95% of the time. [name] states she is doing physical therapy for 45 minutes 2 times per week.  Today's visit was #: 71 Starting weight: 220 lbs Starting date: 01/19/2018 Today's weight: 193 lbs Today's date: 07/31/2021 Total lbs lost to date: 27 Total lbs lost since last in-office visit: 0  Interim History: [name] is averaging between 100-105 grams of protein and 1300 calories most days. She is more sedentary than normal but she is willing to start walking for exercise. She is asking about Jardiance for diabetes today to assist in her weight loss efforts. She does not tolerate GLP-1s.  Subjective:   1. Controlled type 2 diabetes mellitus without complication, without long-term current use of insulin (HCC) Kaitlynn is on metformin once daily. Last A1c was well controlled at 6.6. her blood sugars average between 120-144. She is asking about Jardiance instead of metformin.  Assessment/Plan:   1. Controlled type 2 diabetes mellitus without complication, without long-term current use of insulin ([place]) We will consider Jardiance as an option for diabetes mellitus II. Will encourage her to speak with Dr. Loanne Drilling, Endocrinologist in regards to starting Jardiance and discontinuing metformin. Good blood sugar control is important to decrease the likelihood of diabetic complications such as nephropathy, neuropathy, limb loss, blindness, coronary artery disease, and death. Intensive lifestyle modification including diet, exercise and weight loss are the first line of treatment for diabetes.   2. Obesity with current BMI 31.17 [name] is currently in  the action stage of change. As such, her goal is to continue with weight loss efforts. She has agreed to keeping a food journal and adhering to recommended goals of 1300-1400 calories and 100 grams of protein daily.   Exercise goals: As is.  Behavioral modification strategies: keeping healthy foods in the home and ways to avoid boredom eating.  Mccayla has agreed to follow-up with our clinic in 2 weeks. She was informed of the importance of frequent follow-up visits to maximize her success with intensive lifestyle modifications for her multiple health conditions.   Objective:   Blood pressure 117/61, pulse 65, temperature 99.1 F (37.3 C), height 5\' 6"  (1.676 m), weight 193 lb (87.5 kg), SpO2 98 %. Body mass index is 31.15 kg/m.  General: Cooperative, alert, well developed, in no acute distress. HEENT: Conjunctivae and lids unremarkable. Cardiovascular: Regular rhythm.  Lungs: Normal work of breathing. Neurologic: No focal deficits.   Lab Results  Component Value Date   CREATININE 0.63 06/05/2021   BUN 16 06/05/2021   NA 135 06/05/2021   K 5.2 06/05/2021   CL 97 06/05/2021   CO2 22 06/05/2021   Lab Results  Component Value Date   ALT 15 06/05/2021   AST 27 06/05/2021   ALKPHOS 251 (H) 06/05/2021   BILITOT 0.4 06/05/2021   Lab Results  Component Value Date   HGBA1C 6.6 (H) 06/05/2021   HGBA1C 6.5 (H) 02/09/2021   HGBA1C 6.3 (H) 01/25/2021   HGBA1C 5.7 (H) 04/20/2020   HGBA1C 5.9 (A) 12/09/2019   Lab Results  Component Value Date   INSULIN 12.9 06/05/2021   INSULIN 7.7 01/25/2021   INSULIN 13.1 04/20/2020   INSULIN 12.1 01/19/2018  Lab Results  Component Value Date   TSH 3.38 06/04/2018   Lab Results  Component Value Date   CHOL 144 06/05/2021   HDL 64 06/05/2021   LDLCALC 65 06/05/2021   LDLDIRECT 58.0 11/25/2017   TRIG 75 06/05/2021   CHOLHDL 2.3 06/05/2021   Lab Results  Component Value Date   VD25OH 70.1 06/05/2021   VD25OH 74.5 01/25/2021    VD25OH 66.5 04/20/2020   Lab Results  Component Value Date   WBC 11.5 (H) 02/09/2021   HGB 12.9 02/09/2021   HCT 41.1 02/09/2021   MCV 87.1 02/09/2021   PLT 401 (H) 02/09/2021   No results found for: IRON, TIBC, FERRITIN  Obesity Behavioral Intervention:   Approximately 15 minutes were spent on the discussion below.  ASK: We discussed the diagnosis of obesity with [name] today and Kamyah agreed to give [name] permission to discuss obesity behavioral modification therapy today.  ASSESS: [name] has the diagnosis of obesity and her BMI today is 31.2. [name] is in the action stage of change.   ADVISE: [name] was educated on the multiple health risks of obesity as well as the benefit of weight loss to improve her health. She was advised of the need for long term treatment and the importance of lifestyle modifications to improve her current health and to decrease her risk of future health problems.  AGREE: Multiple dietary modification options and treatment options were discussed and [name] agreed to follow the recommendations documented in the above note.  ARRANGE: Marco was educated on the importance of frequent visits to treat obesity as outlined per CMS and USPSTF guidelines and agreed to schedule her next follow up appointment today.  Attestation Statements:   Reviewed by clinician on day of visit: allergies, medications, problem list, medical history, surgical history, family history, social history, and previous encounter notes.   Wilhemena Durie, am acting as transcriptionist for [employer], PA-C.  I have reviewed the above documentation for accuracy and completeness, and I agree with the above. [name], PA-C

## 2021-08-02 ENCOUNTER — Ambulatory Visit: Payer: Medicare PPO | Admitting: Neurology

## 2021-08-07 ENCOUNTER — Ambulatory Visit: Payer: Medicare PPO | Attending: Orthopedic Surgery | Admitting: Physical Therapy

## 2021-08-07 ENCOUNTER — Other Ambulatory Visit: Payer: Self-pay

## 2021-08-07 ENCOUNTER — Encounter: Payer: Self-pay | Admitting: Physical Therapy

## 2021-08-07 DIAGNOSIS — R600 Localized edema: Secondary | ICD-10-CM | POA: Insufficient documentation

## 2021-08-07 DIAGNOSIS — Z96612 Presence of left artificial shoulder joint: Secondary | ICD-10-CM | POA: Insufficient documentation

## 2021-08-07 DIAGNOSIS — M25512 Pain in left shoulder: Secondary | ICD-10-CM | POA: Diagnosis not present

## 2021-08-07 DIAGNOSIS — M25612 Stiffness of left shoulder, not elsewhere classified: Secondary | ICD-10-CM | POA: Diagnosis not present

## 2021-08-07 NOTE — Therapy (Signed)
[address] [place], [place], [address] Phone: [phone]   Fax:  [phone]  Physical Therapy Treatment  Patient Details  Name: [name] MRN: [id number] Date of Birth: December 06, 1950 Referring Provider (PT): Mardelle Matte   Encounter Date: 08/07/2021   PT End of Session - 08/07/21 1440     Visit Number 26    Date for PT Re-Evaluation 09/21/21    Authorization Type [employer]    PT Start Time 1350    PT Stop Time 3244    PT Time Calculation (min) 55 min    Activity Tolerance Patient tolerated treatment well    Behavior During Therapy WFL for tasks assessed/performed             Past Medical History:  Diagnosis Date   Allergy    Anxiety    Arthritis    back & knee   Asthma     mild per pt shows up with resp illness   Colon polyps    Constipation    Diabetes mellitus without complication (HCC)    Gallstones    Gastric polyps    Gastroparesis    GERD (gastroesophageal reflux disease)    Headache    sinus headaches and migraines at times   Heart murmur    History of migraine headaches    HTN (hypertension)    Hyperlipidemia    IBS (irritable bowel syndrome)    Joint pain    Lumbar disc disease    PONV (postoperative nausea and vomiting)    S/P aortic valve replacement with bioprosthetic valve 08/23/2016   25 mm Edwards Intuity rapid-deployment bovine pericardial tissue valve via partial upper mini sternotomy   Sleep apnea    CPAP   TIA (transient ischemic attack)    TIA (transient ischemic attack)    hx of per pt    Past Surgical History:  Procedure Laterality Date   ABDOMINAL HYSTERECTOMY     AORTIC VALVE REPLACEMENT N/A 08/23/2016   Procedure: AORTIC VALVE REPLACEMENT (AVR) - using partial Upper Sternotomy- 87m Edwards Intuity Aortic Valve used;  Surgeon: CRexene Alberts MD;  [facility]  Service: Open Heart Surgery;  Laterality: N/A;   BLADDER SUSPENSION  2007   BREAST ENHANCEMENT  SURGERY  1980   CARPAL TUNNEL RELEASE Bilateral    COLONOSCOPY     DORSAL COMPARTMENT RELEASE Right 09/15/2014   Procedure: RIGHT WRIST DEQUERVAINS RELEASE ;  Surgeon: DKathryne Hitch MD;  [facility]  Service: Orthopedics;  Laterality: Right;   ESOPHAGOGASTRODUODENOSCOPY     EYE SURGERY     cataract surgery   KNEE ARTHROSCOPY  04/15/2012   Procedure: ARTHROSCOPY KNEE;  Surgeon: DNinetta Lights MD;  [facility]  Service: Orthopedics;  Laterality: Right;   LAPAROSCOPIC CHOLECYSTECTOMY     LASIK     OOPHORECTOMY     PALATE TO GINGIVA GRAFT  2017   REVERSE SHOULDER ARTHROPLASTY Left 02/13/2021   Procedure: REVERSE SHOULDER ARTHROPLASTY;  Surgeon: LMarchia Bond MD;  [facility];  Service: Orthopedics;  Laterality: Left;   RIGHT/LEFT HEART CATH AND CORONARY ANGIOGRAPHY N/A 08/02/2016   Procedure: Right/Left Heart Cath and Coronary Angiography;  Surgeon: MSherren Mocha MD;  [facility];  Service: Cardiovascular;  Laterality: N/A;   ROTATOR CUFF REPAIR Left    TEE WITHOUT CARDIOVERSION N/A 08/23/2016   Procedure: TRANSESOPHAGEAL ECHOCARDIOGRAM (TEE);  Surgeon: CRexene Alberts MD;  [facility]  Service:  Open Heart Surgery;  Laterality: N/A;   TOTAL KNEE ARTHROPLASTY  04/15/2012   Procedure: TOTAL KNEE ARTHROPLASTY;  Surgeon: [name], MD;  Location: [place];  Service: Orthopedics;  Laterality: Right;   TRIGGER FINGER RELEASE Right 04/20/2015   Procedure: RIGHT TRIGGER FINGER RELEASE (TENDON SHEALTH INCISION) ;  Surgeon: [name], MD;  Location: [facility] [place];  Service: Orthopedics;  Laterality: Right;    There were no vitals filed for this visit.   Subjective Assessment - 08/07/21 1350     Subjective Patietn reports that she has been having back pain so has not been to the gym,  she still struggles with doing her hair, dressing is better but unable to do the bra in the back.  She continues to c/o stiffness in the  shoulder    Currently in Pain? Yes    Pain Score 2     Pain Location Shoulder    Pain Orientation Left    Pain Descriptors / Indicators Tightness    Aggravating Factors  reaching back, holdong arm up for any length of time.                [facility] PT Assessment - 08/07/21 0001       AROM   Left Shoulder Flexion 116 Degrees    Left Shoulder ABduction 105 Degrees    Left Shoulder Internal Rotation 60 Degrees    Left Shoulder External Rotation 30 Degrees                           [facility] Adult PT Treatment/Exercise - 08/07/21 0001       Shoulder Exercises: Seated   External Rotation Left;20 reps;Weights    External Rotation Weight (lbs) 1#    External Rotation Limitations then worked on Nurse, mental health, also did 2# eccentrics    Flexion Left;20 reps    Flexion Limitations 2# stick    Abduction Left;15 reps    ABduction Weight (lbs) 1      Vasopneumatic   Number Minutes Vasopneumatic  10 minutes    Vasopnuematic Location  Shoulder    Vasopneumatic Pressure Medium    Vasopneumatic Temperature  34      Manual Therapy   Manual Therapy Soft tissue mobilization;Passive ROM    Passive ROM PROM to end range ER most limited              Trigger Point Dry Needling - 08/07/21 0001     Consent Given? Yes    Education Handout Provided Yes    Muscles Treated Head and Neck Upper trapezius    Muscles Treated Upper Quadrant Deltoid    Upper Trapezius Response Twitch reponse elicited;Palpable increased muscle length    Deltoid Response Twitch response elicited;Palpable increased muscle length                     PT Short Term Goals - 04/16/21 1510       PT SHORT TERM GOAL #1   Title independent with intial HEP    Status Achieved               PT Long Term Goals - 08/07/21 1442       PT LONG TERM GOAL #1   Title decrease pain 50%    Status Achieved      PT LONG TERM GOAL #2   Title returns safely to the gym    Status Partially Met  PT LONG TERM GOAL #3   Title increase AROM of the left shoulder to 120 degrees flexion in standing    Status Achieved      PT LONG TERM GOAL #4   Title report no difficulty dressing or doing hair    Status Partially Met                   Plan - 08/07/21 1440     Clinical Impression Statement Patient has not been in for three weeks, we were working on getting approval for visits, she was to try to go to th egym but reports back pain limited her going, she has a slight loss in ROM but overall did well, the ER and the ability to reach overhead and do her hair was very difficult, I added some dry needles today    PT Next Visit Plan See how today's treatment went, could try to maximize ROM, strength and function    Consulted and Agree with Plan of Care Patient             Patient will benefit from skilled therapeutic intervention in order to improve the following deficits and impairments:  Decreased range of motion, Impaired UE functional use, Increased muscle spasms, Pain, Improper body mechanics, Postural dysfunction, Increased edema, Decreased strength, Decreased mobility  Visit Diagnosis: Stiffness of left shoulder, not elsewhere classified  Localized edema  S/P reverse total shoulder arthroplasty, left  Acute pain of left shoulder     Problem List Patient Active Problem List   Diagnosis Date Noted   S/P reverse total shoulder arthroplasty, left 02/13/2021   Idiopathic hyperphosphatasia 08/03/2019   Vitamin D deficiency 06/04/2018   Hyponatremia 02/17/2018   History of CVA in adulthood 08/13/2017   Blurry vision 08/13/2017   Hypertension associated with diabetes ([place]) 01/02/2017   S/P minimally invasive aortic valve replacement with bioprosthetic valve 08/67/6195   Diastolic dysfunction    Osteopenia 05/27/2016   OSA (obstructive sleep apnea) 02/27/2016   Class 1 obesity with serious comorbidity and body mass index (BMI) of 32.0 to 32.9 in adult 10/23/2015    Bruxism 10/23/2015   Elevated alkaline phosphatase level 05/16/2015   Atypical chest pain 11/10/2014   Acne 05/12/2014   Diabetes mellitus ([place]) 05/12/2014   GERD (gastroesophageal reflux disease) 03/10/2014   Generalized anxiety disorder 03/10/2014   Migraines 03/10/2014   EUSTACHIAN TUBE DYSFUNCTION, CHRONIC 06/14/2010   Irritable bowel syndrome 12/15/2008   Low back pain 08/11/2008   Hyperlipidemia associated with type 2 diabetes mellitus ([place]) 02/09/2008   Allergic rhinitis 02/12/2007   Asthma 02/12/2007    Sumner Boast, PT 08/07/2021, 2:43 PM  [address] [place], [place], [address] Phone: [phone]   Fax:  [phone]  Name: [name] MRN: [id number] Date of Birth: Nov 12, 1950

## 2021-08-07 NOTE — Patient Instructions (Signed)

## 2021-08-09 ENCOUNTER — Other Ambulatory Visit: Payer: Self-pay

## 2021-08-09 ENCOUNTER — Ambulatory Visit: Payer: Medicare PPO | Admitting: Physical Therapy

## 2021-08-09 DIAGNOSIS — R600 Localized edema: Secondary | ICD-10-CM

## 2021-08-09 DIAGNOSIS — M25612 Stiffness of left shoulder, not elsewhere classified: Secondary | ICD-10-CM | POA: Diagnosis not present

## 2021-08-09 DIAGNOSIS — M25512 Pain in left shoulder: Secondary | ICD-10-CM | POA: Diagnosis not present

## 2021-08-09 DIAGNOSIS — Z96612 Presence of left artificial shoulder joint: Secondary | ICD-10-CM | POA: Diagnosis not present

## 2021-08-09 NOTE — Therapy (Signed)
[address] [place], [place], [address] Phone: [phone]   Fax:  [phone]  Physical Therapy Treatment  Patient Details  Name: [name] MRN: [id number] Date of Birth: 08-31-1950 Referring Provider (PT): Mardelle Matte   Encounter Date: 08/09/2021   PT End of Session - 08/09/21 1651     Visit Number 27    Date for PT Re-Evaluation 09/21/21    Authorization Type [employer]    PT Start Time 1615    PT Stop Time 1705    PT Time Calculation (min) 50 min             Past Medical History:  Diagnosis Date   Allergy    Anxiety    Arthritis    back & knee   Asthma     mild per pt shows up with resp illness   Colon polyps    Constipation    Diabetes mellitus without complication (HCC)    Gallstones    Gastric polyps    Gastroparesis    GERD (gastroesophageal reflux disease)    Headache    sinus headaches and migraines at times   Heart murmur    History of migraine headaches    HTN (hypertension)    Hyperlipidemia    IBS (irritable bowel syndrome)    Joint pain    Lumbar disc disease    PONV (postoperative nausea and vomiting)    S/P aortic valve replacement with bioprosthetic valve 08/23/2016   25 mm Edwards Intuity rapid-deployment bovine pericardial tissue valve via partial upper mini sternotomy   Sleep apnea    CPAP   TIA (transient ischemic attack)    TIA (transient ischemic attack)    hx of per pt    Past Surgical History:  Procedure Laterality Date   ABDOMINAL HYSTERECTOMY     AORTIC VALVE REPLACEMENT N/A 08/23/2016   Procedure: AORTIC VALVE REPLACEMENT (AVR) - using partial Upper Sternotomy- 67m Edwards Intuity Aortic Valve used;  Surgeon: CRexene Alberts MD;  [facility]  Service: Open Heart Surgery;  Laterality: N/A;   BLADDER SUSPENSION  2007   BREAST ENHANCEMENT SURGERY  1980   CARPAL TUNNEL RELEASE Bilateral    COLONOSCOPY     DORSAL COMPARTMENT RELEASE Right 09/15/2014    Procedure: RIGHT WRIST DEQUERVAINS RELEASE ;  Surgeon: DKathryne Hitch MD;  [facility]  Service: Orthopedics;  Laterality: Right;   ESOPHAGOGASTRODUODENOSCOPY     EYE SURGERY     cataract surgery   KNEE ARTHROSCOPY  04/15/2012   Procedure: ARTHROSCOPY KNEE;  Surgeon: DNinetta Lights MD;  [facility]  Service: Orthopedics;  Laterality: Right;   LAPAROSCOPIC CHOLECYSTECTOMY     LASIK     OOPHORECTOMY     PALATE TO GINGIVA GRAFT  2017   REVERSE SHOULDER ARTHROPLASTY Left 02/13/2021   Procedure: REVERSE SHOULDER ARTHROPLASTY;  Surgeon: LMarchia Bond MD;  [facility];  Service: Orthopedics;  Laterality: Left;   RIGHT/LEFT HEART CATH AND CORONARY ANGIOGRAPHY N/A 08/02/2016   Procedure: Right/Left Heart Cath and Coronary Angiography;  Surgeon: MSherren Mocha MD;  [facility];  Service: Cardiovascular;  Laterality: N/A;   ROTATOR CUFF REPAIR Left    TEE WITHOUT CARDIOVERSION N/A 08/23/2016   Procedure: TRANSESOPHAGEAL ECHOCARDIOGRAM (TEE);  Surgeon: CRexene Alberts MD;  [facility]  Service: Open Heart Surgery;  Laterality: N/A;   TOTAL KNEE ARTHROPLASTY  04/15/2012   Procedure: TOTAL KNEE ARTHROPLASTY;  Surgeon: [name], MD;  Location: [place];  Service: Orthopedics;  Laterality: Right;   TRIGGER FINGER RELEASE Right 04/20/2015   Procedure: RIGHT TRIGGER FINGER RELEASE (TENDON SHEALTH INCISION) ;  Surgeon: [name], MD;  Location: [place];  Service: Orthopedics;  Laterality: Right;    There were no vitals filed for this visit.   Subjective Assessment - 08/09/21 1606     Subjective sore yesterday but good now. no pain just tightness. back has been acting up    Currently in Pain? No/denies                               [facility] Adult PT Treatment/Exercise - 08/09/21 0001       Shoulder Exercises: Supine   Flexion Strengthening;Left;20 reps;Weights    Shoulder Flexion Weight (lbs) 3     ABduction Strengthening;Left;20 reps    Shoulder ABduction Weight (lbs) 3    Other Supine Exercises ER/IR 3# 2x10    Other Supine Exercises chest press 3# 2 sets 10      Shoulder Exercises: ROM/Strengthening   UBE (Upper Arm Bike) L 2 3 min fwd/3 min backward    Lat Pull 20 reps   20#   Cybex Press 2 plate;20 reps    Cybex Row 20 reps   20#     Vasopneumatic   Number Minutes Vasopneumatic  10 minutes    Vasopnuematic Location  Shoulder    Vasopneumatic Pressure Medium    Vasopneumatic Temperature  34      Manual Therapy   Manual Therapy Soft tissue mobilization;Passive ROM    Soft tissue mobilization left shld    Passive ROM PROM to end range with use of CR                       PT Short Term Goals - 04/16/21 1510       PT SHORT TERM GOAL #1   Title independent with intial HEP    Status Achieved               PT Long Term Goals - 08/07/21 1442       PT LONG TERM GOAL #1   Title decrease pain 50%    Status Achieved      PT LONG TERM GOAL #2   Title returns safely to the gym    Status Partially Met      PT LONG TERM GOAL #3   Title increase AROM of the left shoulder to 120 degrees flexion in standing    Status Achieved      PT LONG TERM GOAL #4   Title report no difficulty dressing or doing hair    Status Partially Met                   Plan - 08/09/21 1651     Clinical Impression Statement resumed and progressed strengthening. PROM with CR and STW to left shld muscles. VASO at pt request after MT    PT Treatment/Interventions ADLs/Self Care Home Management;Cryotherapy;Electrical Stimulation;Therapeutic activities;Therapeutic exercise;Neuromuscular re-education;Manual techniques;Patient/family education;Cognitive remediation;Vasopneumatic Device    PT Next Visit Plan maximize ROM, strength and function DN?             Patient will benefit from skilled therapeutic intervention in order to improve the following deficits and  impairments:  Decreased range of motion, Impaired UE functional use, Increased muscle  spasms, Pain, Improper body mechanics, Postural dysfunction, Increased edema, Decreased strength, Decreased mobility  Visit Diagnosis: Stiffness of left shoulder, not elsewhere classified  Localized edema     Problem List Patient Active Problem List   Diagnosis Date Noted   S/P reverse total shoulder arthroplasty, left 02/13/2021   Idiopathic hyperphosphatasia 08/03/2019   Vitamin D deficiency 06/04/2018   Hyponatremia 02/17/2018   History of CVA in adulthood 08/13/2017   Blurry vision 08/13/2017   Hypertension associated with diabetes ([place]) 01/02/2017   S/P minimally invasive aortic valve replacement with bioprosthetic valve 59/02/3111   Diastolic dysfunction    Osteopenia 05/27/2016   OSA (obstructive sleep apnea) 02/27/2016   Class 1 obesity with serious comorbidity and body mass index (BMI) of 32.0 to 32.9 in adult 10/23/2015   Bruxism 10/23/2015   Elevated alkaline phosphatase level 05/16/2015   Atypical chest pain 11/10/2014   Acne 05/12/2014   Diabetes mellitus ([place]) 05/12/2014   GERD (gastroesophageal reflux disease) 03/10/2014   Generalized anxiety disorder 03/10/2014   Migraines 03/10/2014   EUSTACHIAN TUBE DYSFUNCTION, CHRONIC 06/14/2010   Irritable bowel syndrome 12/15/2008   Low back pain 08/11/2008   Hyperlipidemia associated with type 2 diabetes mellitus ([place]) 02/09/2008   Allergic rhinitis 02/12/2007   Asthma 02/12/2007    Mohab Ashby,ANGIE, PTA 08/09/2021, 4:53 PM  [address]. [place], [place], [address] Phone: [phone]   Fax:  [phone]  Name: ARIELE VIDRIO MRN: [id number] Date of Birth: 1950/12/27

## 2021-08-14 ENCOUNTER — Ambulatory Visit (INDEPENDENT_AMBULATORY_CARE_PROVIDER_SITE_OTHER): Payer: Medicare PPO | Admitting: Bariatrics

## 2021-08-14 ENCOUNTER — Other Ambulatory Visit: Payer: Self-pay | Admitting: Family Medicine

## 2021-08-14 ENCOUNTER — Encounter (INDEPENDENT_AMBULATORY_CARE_PROVIDER_SITE_OTHER): Payer: Self-pay | Admitting: Bariatrics

## 2021-08-14 ENCOUNTER — Other Ambulatory Visit: Payer: Self-pay

## 2021-08-14 ENCOUNTER — Ambulatory Visit: Payer: Medicare PPO | Admitting: Physical Therapy

## 2021-08-14 VITALS — BP 136/60 | HR 70 | Temp 98.5°F | Ht 66.0 in | Wt 193.0 lb

## 2021-08-14 DIAGNOSIS — E7849 Other hyperlipidemia: Secondary | ICD-10-CM | POA: Diagnosis not present

## 2021-08-14 DIAGNOSIS — M25612 Stiffness of left shoulder, not elsewhere classified: Secondary | ICD-10-CM | POA: Diagnosis not present

## 2021-08-14 DIAGNOSIS — Z6831 Body mass index (BMI) 31.0-31.9, adult: Secondary | ICD-10-CM

## 2021-08-14 DIAGNOSIS — Z7984 Long term (current) use of oral hypoglycemic drugs: Secondary | ICD-10-CM | POA: Diagnosis not present

## 2021-08-14 DIAGNOSIS — Z96612 Presence of left artificial shoulder joint: Secondary | ICD-10-CM | POA: Diagnosis not present

## 2021-08-14 DIAGNOSIS — M25512 Pain in left shoulder: Secondary | ICD-10-CM | POA: Diagnosis not present

## 2021-08-14 DIAGNOSIS — K581 Irritable bowel syndrome with constipation: Secondary | ICD-10-CM

## 2021-08-14 DIAGNOSIS — R600 Localized edema: Secondary | ICD-10-CM | POA: Diagnosis not present

## 2021-08-14 DIAGNOSIS — E1169 Type 2 diabetes mellitus with other specified complication: Secondary | ICD-10-CM

## 2021-08-14 DIAGNOSIS — E669 Obesity, unspecified: Secondary | ICD-10-CM | POA: Diagnosis not present

## 2021-08-14 DIAGNOSIS — E871 Hypo-osmolality and hyponatremia: Secondary | ICD-10-CM

## 2021-08-14 NOTE — Therapy (Signed)
[facility] [place], [place], [address] Phone: [phone]   Fax:  [phone]  Physical Therapy Treatment  Patient Details  Name: [name] MRN: [id number] Date of Birth: 16-Aug-1950 Referring Provider (PT): Mardelle Matte   Encounter Date: 08/14/2021   PT End of Session - 08/14/21 1517     Visit Number 28    Number of Visits 25    Date for PT Re-Evaluation 09/21/21    Authorization Type [employer]    PT Start Time 5830    PT Stop Time 1525    PT Time Calculation (min) 60 min             Past Medical History:  Diagnosis Date   Allergy    Anxiety    Arthritis    back & knee   Asthma     mild per pt shows up with resp illness   Colon polyps    Constipation    Diabetes mellitus without complication (HCC)    Gallstones    Gastric polyps    Gastroparesis    GERD (gastroesophageal reflux disease)    Headache    sinus headaches and migraines at times   Heart murmur    History of migraine headaches    HTN (hypertension)    Hyperlipidemia    IBS (irritable bowel syndrome)    Joint pain    Lumbar disc disease    PONV (postoperative nausea and vomiting)    S/P aortic valve replacement with bioprosthetic valve 08/23/2016   25 mm Edwards Intuity rapid-deployment bovine pericardial tissue valve via partial upper mini sternotomy   Sleep apnea    CPAP   TIA (transient ischemic attack)    TIA (transient ischemic attack)    hx of per pt    Past Surgical History:  Procedure Laterality Date   ABDOMINAL HYSTERECTOMY     AORTIC VALVE REPLACEMENT N/A 08/23/2016   Procedure: AORTIC VALVE REPLACEMENT (AVR) - using partial Upper Sternotomy- 63m Edwards Intuity Aortic Valve used;  Surgeon: CRexene Alberts MD;  [facility]  Service: Open Heart Surgery;  Laterality: N/A;   BLADDER SUSPENSION  2007   BREAST ENHANCEMENT SURGERY  1980   CARPAL TUNNEL RELEASE Bilateral    COLONOSCOPY     DORSAL COMPARTMENT  RELEASE Right 09/15/2014   Procedure: RIGHT WRIST DEQUERVAINS RELEASE ;  Surgeon: DKathryne Hitch MD;  [facility]  Service: Orthopedics;  Laterality: Right;   ESOPHAGOGASTRODUODENOSCOPY     EYE SURGERY     cataract surgery   KNEE ARTHROSCOPY  04/15/2012   Procedure: ARTHROSCOPY KNEE;  Surgeon: DNinetta Lights MD;  [facility] [facility]  Service: Orthopedics;  Laterality: Right;   LAPAROSCOPIC CHOLECYSTECTOMY     LASIK     OOPHORECTOMY     PALATE TO GINGIVA GRAFT  2017   REVERSE SHOULDER ARTHROPLASTY Left 02/13/2021   Procedure: REVERSE SHOULDER ARTHROPLASTY;  Surgeon: LMarchia Bond MD;  [facility];  Service: Orthopedics;  Laterality: Left;   RIGHT/LEFT HEART CATH AND CORONARY ANGIOGRAPHY N/A 08/02/2016   Procedure: Right/Left Heart Cath and Coronary Angiography;  Surgeon: MSherren Mocha MD;  [facility];  Service: Cardiovascular;  Laterality: N/A;   ROTATOR CUFF REPAIR Left    TEE WITHOUT CARDIOVERSION N/A 08/23/2016   Procedure: TRANSESOPHAGEAL ECHOCARDIOGRAM (TEE);  Surgeon: CRexene Alberts MD;  [facility]  Service: Open Heart Surgery;  Laterality: N/A;   TOTAL KNEE ARTHROPLASTY  04/15/2012   Procedure: TOTAL KNEE ARTHROPLASTY;  Surgeon: [name], MD;  Location: [facility];  Service: Orthopedics;  Laterality: Right;   TRIGGER FINGER RELEASE Right 04/20/2015   Procedure: RIGHT TRIGGER FINGER RELEASE (TENDON SHEALTH INCISION) ;  Surgeon: [name], MD;  Location: [facility];  Service: Orthopedics;  Laterality: Right;    There were no vitals filed for this visit.   Subjective Assessment - 08/14/21 1442     Subjective last session really helped tightness    Currently in Pain? Yes    Pain Score 1     Pain Location Scapula    Pain Orientation Left                               [facility] Adult PT Treatment/Exercise - 08/14/21 0001       Shoulder Exercises: Seated   Row Strengthening;Both;10 reps    2 sets 10 3#   Horizontal ABduction Strengthening;Both;20 reps   3#   Other Seated Exercises shld ext 3# 20 x      Shoulder Exercises: Standing   Internal Rotation Strengthening;Both;20 reps   3# wt bar   Extension Strengthening;20 reps   3# wt bar   Other Standing Exercises 3 # OH press 2 sets 10      Shoulder Exercises: ROM/Strengthening   UBE (Upper Arm Bike) L 3 3 min fwd/3 min back    Lat Pull 20 reps   20#   Cybex Press 2 plate;20 reps    Cybex Row 20 reps   20#     Modalities   Modalities Vasopneumatic      Vasopneumatic   Number Minutes Vasopneumatic  10 minutes    Vasopnuematic Location  Shoulder    Vasopneumatic Pressure Medium    Vasopneumatic Temperature  34      Manual Therapy   Soft tissue mobilization left shld    Passive ROM PROM with end range stretch after DN              Trigger Point Dry Needling - 08/14/21 0001     Upper Trapezius Response Twitch reponse elicited;Palpable increased muscle length    Deltoid Response Twitch response elicited;Palpable increased muscle length                     PT Short Term Goals - 04/16/21 1510       PT SHORT TERM GOAL #1   Title independent with intial HEP    Status Achieved               PT Long Term Goals - 08/07/21 1442       PT LONG TERM GOAL #1   Title decrease pain 50%    Status Achieved      PT LONG TERM GOAL #2   Title returns safely to the gym    Status Partially Met      PT LONG TERM GOAL #3   Title increase AROM of the left shoulder to 120 degrees flexion in standing    Status Achieved      PT LONG TERM GOAL #4   Title report no difficulty dressing or doing hair    Status Partially Met                   Plan - 08/14/21 1517     Clinical Impression Statement progressed ther ex, most difficult with  ext behind back. cuing with ex. improved ROM after DN    PT Treatment/Interventions ADLs/Self Care Home Management;Cryotherapy;Electrical  Stimulation;Therapeutic activities;Therapeutic exercise;Neuromuscular re-education;Manual techniques;Patient/family education;Cognitive remediation;Vasopneumatic Device    PT Next Visit Plan assess goals and progress             Patient will benefit from skilled therapeutic intervention in order to improve the following deficits and impairments:  Decreased range of motion, Impaired UE functional use, Increased muscle spasms, Pain, Improper body mechanics, Postural dysfunction, Increased edema, Decreased strength, Decreased mobility  Visit Diagnosis: Stiffness of left shoulder, not elsewhere classified  Localized edema     Problem List Patient Active Problem List   Diagnosis Date Noted   S/P reverse total shoulder arthroplasty, left 02/13/2021   Idiopathic hyperphosphatasia 08/03/2019   Vitamin D deficiency 06/04/2018   Hyponatremia 02/17/2018   History of CVA in adulthood 08/13/2017   Blurry vision 08/13/2017   Hypertension associated with diabetes ([place]) 01/02/2017   S/P minimally invasive aortic valve replacement with bioprosthetic valve 49/32/4199   Diastolic dysfunction    Osteopenia 05/27/2016   OSA (obstructive sleep apnea) 02/27/2016   Class 1 obesity with serious comorbidity and body mass index (BMI) of 32.0 to 32.9 in adult 10/23/2015   Bruxism 10/23/2015   Elevated alkaline phosphatase level 05/16/2015   Atypical chest pain 11/10/2014   Acne 05/12/2014   Diabetes mellitus ([place]) 05/12/2014   GERD (gastroesophageal reflux disease) 03/10/2014   Generalized anxiety disorder 03/10/2014   Migraines 03/10/2014   EUSTACHIAN TUBE DYSFUNCTION, CHRONIC 06/14/2010   Irritable bowel syndrome 12/15/2008   Low back pain 08/11/2008   Hyperlipidemia associated with type 2 diabetes mellitus ([place]) 02/09/2008   Allergic rhinitis 02/12/2007   Asthma 02/12/2007    Sumner Boast, PT 08/14/2021, 4:59 PM  [facility]. [place], [place], [address] Phone: [phone]   Fax:  [phone]  Name: CARRINA SCHOENBERGER MRN: [id number] Date of Birth: April 12, 1951

## 2021-08-14 NOTE — Progress Notes (Signed)
Chief Complaint:   OBESITY [name] is here to discuss her progress with her obesity treatment plan along with follow-up of her obesity related diagnoses. [name] is on keeping a food journal and adhering to recommended goals of 1300-1400 calories and 100 grams of protein daily and states she is following her eating plan approximately 90% of the time. [name] states she is doing physical therapy for 45 minutes 2 times per week.  Today's visit was #: 61 Starting weight: 220 lbs Starting date: 01/19/2018 Today's weight: 193 lbs Today's date: 08/14/2021 Total lbs lost to date: 27 Total lbs lost since last in-office visit: 0  Interim History: [name]'s weight remains the same. She is journaling. Her goal is to lose 30 lbs.  Subjective:   1. Type 2 diabetes mellitus with other specified complication, without long-term current use of insulin (HCC) [name] is taking metformin. Her fasting blood sugars range in the 130's.  2. Other hyperlipidemia [name] is taking Lipitor.  3. Hyponatremia [name] is not taking diuretics. Has been chronic since 2018, but currently stable.  4. Irritable bowel syndrome with constipation [name] is taking metamucil, and stool softener. She has tried miralax.  Assessment/Plan:   1. Type 2 diabetes mellitus with other specified complication, without long-term current use of insulin (HCC) [name] will continue metformin. Good blood sugar control is important to decrease the likelihood of diabetic complications such as nephropathy, neuropathy, limb loss, blindness, coronary artery disease, and death. Intensive lifestyle modification including diet, exercise and weight loss are the first line of treatment for diabetes.   2. Other hyperlipidemia Cardiovascular risk and specific lipid/LDL goals reviewed.  We discussed several lifestyle modifications today. [name] will continue Lipitor, and will continue to work on diet, exercise  and weight loss efforts. Orders and follow up as documented in patient record.   Counseling Intensive lifestyle modifications are the first line treatment for this issue. Dietary changes: Increase soluble fiber. Decrease simple carbohydrates. Exercise changes: Moderate to vigorous-intensity aerobic activity 150 minutes per week if tolerated. Lipid-lowering medications: see documented in medical record.  3. Hyponatremia [name] has cut back on her water (32-40 oz daily). She will follow up with her Endocrinologist.  4. Irritable bowel syndrome with constipation [name] will continue her medications.  5. Obesity with current BMI of 31.2 [name] is currently in the action stage of change. As such, her goal is to continue with weight loss efforts. She has agreed to keeping a food journal and adhering to recommended goals of 1300-1400 calories and 100 grams of protein daily.   Meal planning. Intentional eating. Cut back on sweets and snacks. Will continue to keep protein high.  Exercise goals: As is.  Behavioral modification strategies: increasing lean protein intake, decreasing simple carbohydrates, increasing vegetables, increasing water intake, decreasing eating out, no skipping meals, meal planning and cooking strategies, keeping healthy foods in the home, and keeping a strict food journal.  [name] has agreed to follow-up with our clinic in 3 to 4 weeks with [name], PA-C. She was informed of the importance of frequent follow-up visits to maximize her success with intensive lifestyle modifications for her multiple health conditions.   Objective:   Blood pressure 136/60, pulse 70, temperature 98.5 F (36.9 C), height 5\' 6"  (1.676 m), weight 193 lb (87.5 kg), SpO2 97 %. Body mass index is 31.15 kg/m.  General: Cooperative, alert, well developed, in no acute distress. HEENT: Conjunctivae and lids unremarkable. Cardiovascular: Regular rhythm.  Lungs: Normal work of  breathing. Neurologic:  No focal deficits.   Lab Results  Component Value Date   CREATININE 0.63 06/05/2021   BUN 16 06/05/2021   NA 135 06/05/2021   K 5.2 06/05/2021   CL 97 06/05/2021   CO2 22 06/05/2021   Lab Results  Component Value Date   ALT 15 06/05/2021   AST 27 06/05/2021   ALKPHOS 251 (H) 06/05/2021   BILITOT 0.4 06/05/2021   Lab Results  Component Value Date   HGBA1C 6.6 (H) 06/05/2021   HGBA1C 6.5 (H) 02/09/2021   HGBA1C 6.3 (H) 01/25/2021   HGBA1C 5.7 (H) 04/20/2020   HGBA1C 5.9 (A) 12/09/2019   Lab Results  Component Value Date   INSULIN 12.9 06/05/2021   INSULIN 7.7 01/25/2021   INSULIN 13.1 04/20/2020   INSULIN 12.1 01/19/2018   Lab Results  Component Value Date   TSH 3.38 06/04/2018   Lab Results  Component Value Date   CHOL 144 06/05/2021   HDL 64 06/05/2021   LDLCALC 65 06/05/2021   LDLDIRECT 58.0 11/25/2017   TRIG 75 06/05/2021   CHOLHDL 2.3 06/05/2021   Lab Results  Component Value Date   VD25OH 70.1 06/05/2021   VD25OH 74.5 01/25/2021   VD25OH 66.5 04/20/2020   Lab Results  Component Value Date   WBC 11.5 (H) 02/09/2021   HGB 12.9 02/09/2021   HCT 41.1 02/09/2021   MCV 87.1 02/09/2021   PLT 401 (H) 02/09/2021   No results found for: IRON, TIBC, FERRITIN  Attestation Statements:   Reviewed by clinician on day of visit: allergies, medications, problem list, medical history, surgical history, family history, social history, and previous encounter notes.   Wilhemena Durie, am acting as Location manager for [employer], DO.  I have reviewed the above documentation for accuracy and completeness, and I agree with the above. Jearld Lesch, DO

## 2021-08-15 ENCOUNTER — Encounter (INDEPENDENT_AMBULATORY_CARE_PROVIDER_SITE_OTHER): Payer: Self-pay | Admitting: Bariatrics

## 2021-08-16 ENCOUNTER — Ambulatory Visit: Payer: Medicare PPO | Admitting: Physical Therapy

## 2021-08-16 ENCOUNTER — Other Ambulatory Visit: Payer: Self-pay

## 2021-08-16 DIAGNOSIS — M25612 Stiffness of left shoulder, not elsewhere classified: Secondary | ICD-10-CM

## 2021-08-16 DIAGNOSIS — Z96612 Presence of left artificial shoulder joint: Secondary | ICD-10-CM | POA: Diagnosis not present

## 2021-08-16 DIAGNOSIS — R600 Localized edema: Secondary | ICD-10-CM

## 2021-08-16 DIAGNOSIS — M25512 Pain in left shoulder: Secondary | ICD-10-CM | POA: Diagnosis not present

## 2021-08-16 NOTE — Therapy (Signed)
[facility]. [place], [place], [address] Phone: [phone]   Fax:  [phone]  Physical Therapy Treatment  Patient Details  Name: [name] MRN: [id number] Date of Birth: May 27, 1951 Referring Provider (PT): Mardelle Matte   Encounter Date: 08/16/2021   PT End of Session - 08/16/21 1431     Visit Number 29    Date for PT Re-Evaluation 09/21/21    Authorization Type [employer]    PT Start Time 1355    PT Stop Time 1445    PT Time Calculation (min) 50 min             Past Medical History:  Diagnosis Date   Allergy    Anxiety    Arthritis    back & knee   Asthma     mild per pt shows up with resp illness   Colon polyps    Constipation    Diabetes mellitus without complication (HCC)    Gallstones    Gastric polyps    Gastroparesis    GERD (gastroesophageal reflux disease)    Headache    sinus headaches and migraines at times   Heart murmur    History of migraine headaches    HTN (hypertension)    Hyperlipidemia    IBS (irritable bowel syndrome)    Joint pain    Lumbar disc disease    PONV (postoperative nausea and vomiting)    S/P aortic valve replacement with bioprosthetic valve 08/23/2016   25 mm Edwards Intuity rapid-deployment bovine pericardial tissue valve via partial upper mini sternotomy   Sleep apnea    CPAP   TIA (transient ischemic attack)    TIA (transient ischemic attack)    hx of per pt    Past Surgical History:  Procedure Laterality Date   ABDOMINAL HYSTERECTOMY     AORTIC VALVE REPLACEMENT N/A 08/23/2016   Procedure: AORTIC VALVE REPLACEMENT (AVR) - using partial Upper Sternotomy- 71m Edwards Intuity Aortic Valve used;  Surgeon: CRexene Alberts MD;  [facility]  Service: Open Heart Surgery;  Laterality: N/A;   BLADDER SUSPENSION  2007   BREAST ENHANCEMENT SURGERY  1980   CARPAL TUNNEL RELEASE Bilateral    COLONOSCOPY     DORSAL COMPARTMENT RELEASE Right 09/15/2014    Procedure: RIGHT WRIST DEQUERVAINS RELEASE ;  Surgeon: DKathryne Hitch MD;  [facility]  Service: Orthopedics;  Laterality: Right;   ESOPHAGOGASTRODUODENOSCOPY     EYE SURGERY     cataract surgery   KNEE ARTHROSCOPY  04/15/2012   Procedure: ARTHROSCOPY KNEE;  Surgeon: DNinetta Lights MD;  [facility]  Service: Orthopedics;  Laterality: Right;   LAPAROSCOPIC CHOLECYSTECTOMY     LASIK     OOPHORECTOMY     PALATE TO GINGIVA GRAFT  2017   REVERSE SHOULDER ARTHROPLASTY Left 02/13/2021   Procedure: REVERSE SHOULDER ARTHROPLASTY;  Surgeon: LMarchia Bond MD;  [facility];  Service: Orthopedics;  Laterality: Left;   RIGHT/LEFT HEART CATH AND CORONARY ANGIOGRAPHY N/A 08/02/2016   Procedure: Right/Left Heart Cath and Coronary Angiography;  Surgeon: MSherren Mocha MD;  [facility];  Service: Cardiovascular;  Laterality: N/A;   ROTATOR CUFF REPAIR Left    TEE WITHOUT CARDIOVERSION N/A 08/23/2016   Procedure: TRANSESOPHAGEAL ECHOCARDIOGRAM (TEE);  Surgeon: CRexene Alberts MD;  [facility]  Service: Open Heart Surgery;  Laterality: N/A;   TOTAL KNEE ARTHROPLASTY  04/15/2012   Procedure: TOTAL KNEE ARTHROPLASTY;  Surgeon: [name], MD;  Location: [facility];  Service: Orthopedics;  Laterality: Right;   TRIGGER FINGER RELEASE Right 04/20/2015   Procedure: RIGHT TRIGGER FINGER RELEASE (TENDON SHEALTH INCISION) ;  Surgeon: [name], MD;  Location: [facility];  Service: Orthopedics;  Laterality: Right;    There were no vitals filed for this visit.   Subjective Assessment - 08/16/21 1357     Subjective still have that spot on the left side    Currently in Pain? Yes    Pain Score 5     Pain Location Scapula    Pain Orientation Left                               [facility] Adult PT Treatment/Exercise - 08/16/21 0001       Shoulder Exercises: Standing   Flexion Strengthening;Both;20 reps   2 sets 10    Shoulder Flexion Weight (lbs) 3, 2    ABduction Strengthening;Both;10 reps   2 sets   Shoulder ABduction Weight (lbs) 3,2    Extension Strengthening;Both;20 reps;Weights    Extension Weight (lbs) 3    Other Standing Exercises 3 # OH press and PNF 2 sets 10      Shoulder Exercises: ROM/Strengthening   UBE (Upper Arm Bike) L 4 3 min fwd/3 min back    Lat Pull 20 reps   20#   Cybex Press 2 plate;20 reps    Cybex Row 20 reps   20#     Modalities   Modalities Vasopneumatic      Vasopneumatic   Number Minutes Vasopneumatic  10 minutes    Vasopnuematic Location  Shoulder    Vasopneumatic Pressure Medium    Vasopneumatic Temperature  34      Manual Therapy   Manual Therapy Myofascial release;Passive ROM    Myofascial Release left cerv,trap and rhomboid    Passive ROM PROM with end range stretch                       PT Short Term Goals - 04/16/21 1510       PT SHORT TERM GOAL #1   Title independent with intial HEP    Status Achieved               PT Long Term Goals - 08/16/21 1432       PT LONG TERM GOAL #2   Title returns safely to the gym    Status Partially Met      PT LONG TERM GOAL #4   Title report no difficulty dressing or doing hair    Status Partially Met                   Plan - 08/16/21 1432     Clinical Impression Statement pt still having fucn limittations with left shld d/t tightness and TP. MFR to several TP in left rhom and trap. PROM with end rnge stretch and progress ther ex with free wt that was fatiguing and needed cued for compendations with trap and trunk. still progressing towards goals    PT Treatment/Interventions ADLs/Self Care Home Management;Cryotherapy;Electrical Stimulation;Therapeutic activities;Therapeutic exercise;Neuromuscular re-education;Manual techniques;Patient/family education;Cognitive remediation;Vasopneumatic Device    PT Next Visit Plan progress func and address TP with MFR and DN              Patient will benefit from skilled therapeutic intervention in order to  improve the following deficits and impairments:  Decreased range of motion, Impaired UE functional use, Increased muscle spasms, Pain, Improper body mechanics, Postural dysfunction, Increased edema, Decreased strength, Decreased mobility  Visit Diagnosis: Stiffness of left shoulder, not elsewhere classified  Localized edema     Problem List Patient Active Problem List   Diagnosis Date Noted   S/P reverse total shoulder arthroplasty, left 02/13/2021   Idiopathic hyperphosphatasia 08/03/2019   Vitamin D deficiency 06/04/2018   Hyponatremia 02/17/2018   History of CVA in adulthood 08/13/2017   Blurry vision 08/13/2017   Hypertension associated with diabetes ([place]) 01/02/2017   S/P minimally invasive aortic valve replacement with bioprosthetic valve 22/07/5425   Diastolic dysfunction    Osteopenia 05/27/2016   OSA (obstructive sleep apnea) 02/27/2016   Class 1 obesity with serious comorbidity and body mass index (BMI) of 32.0 to 32.9 in adult 10/23/2015   Bruxism 10/23/2015   Elevated alkaline phosphatase level 05/16/2015   Atypical chest pain 11/10/2014   Acne 05/12/2014   Diabetes mellitus ([place]) 05/12/2014   GERD (gastroesophageal reflux disease) 03/10/2014   Generalized anxiety disorder 03/10/2014   Migraines 03/10/2014   EUSTACHIAN TUBE DYSFUNCTION, CHRONIC 06/14/2010   Irritable bowel syndrome 12/15/2008   Low back pain 08/11/2008   Hyperlipidemia associated with type 2 diabetes mellitus ([place]) 02/09/2008   Allergic rhinitis 02/12/2007   Asthma 02/12/2007    Amri Lien,ANGIE, PTA 08/16/2021, 2:35 PM  [facility]. [place], [place], [address] Phone: [phone]   Fax:  [phone]  Name: [name] MRN: [id number] Date of Birth: 1951-06-24

## 2021-08-20 ENCOUNTER — Other Ambulatory Visit: Payer: Self-pay

## 2021-08-20 ENCOUNTER — Encounter: Payer: Self-pay | Admitting: Physical Therapy

## 2021-08-20 ENCOUNTER — Ambulatory Visit: Payer: Medicare PPO | Admitting: Physical Therapy

## 2021-08-20 DIAGNOSIS — R600 Localized edema: Secondary | ICD-10-CM

## 2021-08-20 DIAGNOSIS — Z96612 Presence of left artificial shoulder joint: Secondary | ICD-10-CM

## 2021-08-20 DIAGNOSIS — M25512 Pain in left shoulder: Secondary | ICD-10-CM

## 2021-08-20 DIAGNOSIS — M25612 Stiffness of left shoulder, not elsewhere classified: Secondary | ICD-10-CM | POA: Diagnosis not present

## 2021-08-20 NOTE — Therapy (Signed)
[place]. [place], [place], [address] Phone: [phone]   Fax:  [phone] Progress Note Reporting Period 06/21/21 to 08/20/21 for visit 21-30  See note below for Objective Data and Assessment of Progress/Goals.     Physical Therapy Treatment  Patient Details  Name: [name] MRN: [id number] Date of Birth: 04/04/1951 Referring Provider (PT): Mardelle Matte   Encounter Date: 08/20/2021   PT End of Session - 08/20/21 8588     Visit Number 30    Date for PT Re-Evaluation 09/21/21    Authorization Type [employer]    PT Start Time 5027    PT Stop Time 7412    PT Time Calculation (min) 58 min    Activity Tolerance Patient tolerated treatment well    Behavior During Therapy WFL for tasks assessed/performed             Past Medical History:  Diagnosis Date   Allergy    Anxiety    Arthritis    back & knee   Asthma     mild per pt shows up with resp illness   Colon polyps    Constipation    Diabetes mellitus without complication (HCC)    Gallstones    Gastric polyps    Gastroparesis    GERD (gastroesophageal reflux disease)    Headache    sinus headaches and migraines at times   Heart murmur    History of migraine headaches    HTN (hypertension)    Hyperlipidemia    IBS (irritable bowel syndrome)    Joint pain    Lumbar disc disease    PONV (postoperative nausea and vomiting)    S/P aortic valve replacement with bioprosthetic valve 08/23/2016   25 mm Edwards Intuity rapid-deployment bovine pericardial tissue valve via partial upper mini sternotomy   Sleep apnea    CPAP   TIA (transient ischemic attack)    TIA (transient ischemic attack)    hx of per pt    Past Surgical History:  Procedure Laterality Date   ABDOMINAL HYSTERECTOMY     AORTIC VALVE REPLACEMENT N/A 08/23/2016   Procedure: AORTIC VALVE REPLACEMENT (AVR) - using partial Upper Sternotomy- 72m Edwards Intuity Aortic Valve used;   Surgeon: CRexene Alberts MD;  [facility]  Service: Open Heart Surgery;  Laterality: N/A;   BLADDER SUSPENSION  2007   BREAST ENHANCEMENT SURGERY  1980   CARPAL TUNNEL RELEASE Bilateral    COLONOSCOPY     DORSAL COMPARTMENT RELEASE Right 09/15/2014   Procedure: RIGHT WRIST DEQUERVAINS RELEASE ;  Surgeon: DKathryne Hitch MD;  [facility]  Service: Orthopedics;  Laterality: Right;   ESOPHAGOGASTRODUODENOSCOPY     EYE SURGERY     cataract surgery   KNEE ARTHROSCOPY  04/15/2012   Procedure: ARTHROSCOPY KNEE;  Surgeon: DNinetta Lights MD;  [facility]  Service: Orthopedics;  Laterality: Right;   LAPAROSCOPIC CHOLECYSTECTOMY     LASIK     OOPHORECTOMY     PALATE TO GINGIVA GRAFT  2017   REVERSE SHOULDER ARTHROPLASTY Left 02/13/2021   Procedure: REVERSE SHOULDER ARTHROPLASTY;  Surgeon: LMarchia Bond MD;  [facility];  Service: Orthopedics;  Laterality: Left;   RIGHT/LEFT HEART CATH AND CORONARY ANGIOGRAPHY N/A 08/02/2016   Procedure: Right/Left Heart Cath and Coronary Angiography;  Surgeon: MSherren Mocha MD;  [facility];  Service: Cardiovascular;  Laterality: N/A;   ROTATOR CUFF REPAIR Left  TEE WITHOUT CARDIOVERSION N/A 08/23/2016   Procedure: TRANSESOPHAGEAL ECHOCARDIOGRAM (TEE);  Surgeon: Rexene Alberts, MD;  Location: [facility];  Service: Open Heart Surgery;  Laterality: N/A;   TOTAL KNEE ARTHROPLASTY  04/15/2012   Procedure: TOTAL KNEE ARTHROPLASTY;  Surgeon: [name], MD;  Location: [facility];  Service: Orthopedics;  Laterality: Right;   TRIGGER FINGER RELEASE Right 04/20/2015   Procedure: RIGHT TRIGGER FINGER RELEASE (TENDON SHEALTH INCISION) ;  Surgeon: [name], MD;  Location: [facility];  Service: Orthopedics;  Laterality: Right;    There were no vitals filed for this visit.   Subjective Assessment - 08/20/21 1519     Subjective Still hurting in the left rhomboid. increased with pressure on that area     Currently in Pain? Yes    Pain Score 5     Pain Location Scapula    Pain Orientation Left    Pain Descriptors / Indicators Spasm;Tightness    Aggravating Factors  reacihgn up trying to do hair                               [facility] Adult PT Treatment/Exercise - 08/20/21 0001       Shoulder Exercises: Seated   Row 20 reps;Theraband    Theraband Level (Shoulder Row) Level 2 (Red)    Horizontal ABduction 20 reps;Theraband    Theraband Level (Shoulder Horizontal ABduction) Level 2 (Red)    External Rotation Both;20 reps    Theraband Level (Shoulder External Rotation) Level 1 (Yellow)      Vasopneumatic   Number Minutes Vasopneumatic  10 minutes    Vasopnuematic Location  Shoulder    Vasopneumatic Pressure Medium    Vasopneumatic Temperature  34      Manual Therapy   Joint Mobilization scapulothoracic mobility    Soft tissue mobilization upper trap, rhomboids    Myofascial Release left cerv,trap and rhomboid    Passive ROM PROM with end range stretch                       PT Short Term Goals - 04/16/21 1510       PT SHORT TERM GOAL #1   Title independent with intial HEP    Status Achieved               PT Long Term Goals - 08/16/21 1432       PT LONG TERM GOAL #2   Title returns safely to the gym    Status Partially Met      PT LONG TERM GOAL #4   Title report no difficulty dressing or doing hair    Status Partially Met                   Plan - 08/20/21 1558     Clinical Impression Statement Patient reports that she just wants to be the best she can be, reports that she is still having some rhomboid pain and tightness, and difficulty.  with prolonged overhead activity  She has some tightness along the rhomboid but she really has diffiuclty relaxing    PT Next Visit Plan progress func and address TP with MFR and DN    Consulted and Agree with Plan of Care Patient             Patient will benefit from skilled  therapeutic intervention in order to improve the following deficits  and impairments:  Decreased range of motion, Impaired UE functional use, Increased muscle spasms, Pain, Improper body mechanics, Postural dysfunction, Increased edema, Decreased strength, Decreased mobility  Visit Diagnosis: Stiffness of left shoulder, not elsewhere classified  Localized edema  S/P reverse total shoulder arthroplasty, left  Acute pain of left shoulder     Problem List Patient Active Problem List   Diagnosis Date Noted   S/P reverse total shoulder arthroplasty, left 02/13/2021   Idiopathic hyperphosphatasia 08/03/2019   Vitamin D deficiency 06/04/2018   Hyponatremia 02/17/2018   History of CVA in adulthood 08/13/2017   Blurry vision 08/13/2017   Hypertension associated with diabetes ([place]) 01/02/2017   S/P minimally invasive aortic valve replacement with bioprosthetic valve 17/71/1657   Diastolic dysfunction    Osteopenia 05/27/2016   OSA (obstructive sleep apnea) 02/27/2016   Class 1 obesity with serious comorbidity and body mass index (BMI) of 32.0 to 32.9 in adult 10/23/2015   Bruxism 10/23/2015   Elevated alkaline phosphatase level 05/16/2015   Atypical chest pain 11/10/2014   Acne 05/12/2014   Diabetes mellitus ([place]) 05/12/2014   GERD (gastroesophageal reflux disease) 03/10/2014   Generalized anxiety disorder 03/10/2014   Migraines 03/10/2014   EUSTACHIAN TUBE DYSFUNCTION, CHRONIC 06/14/2010   Irritable bowel syndrome 12/15/2008   Low back pain 08/11/2008   Hyperlipidemia associated with type 2 diabetes mellitus ([place]) 02/09/2008   Allergic rhinitis 02/12/2007   Asthma 02/12/2007    Sumner Boast, PT 08/20/2021, 4:00 PM  [place]. [place], [place], [address] Phone: [phone]   Fax:  [phone]  Name: [name] MRN: [id number] Date of Birth: [birth year]

## 2021-08-22 ENCOUNTER — Other Ambulatory Visit: Payer: Self-pay

## 2021-08-22 ENCOUNTER — Encounter: Payer: Self-pay | Admitting: Physical Therapy

## 2021-08-22 ENCOUNTER — Ambulatory Visit: Payer: Medicare PPO | Attending: Orthopedic Surgery | Admitting: Physical Therapy

## 2021-08-22 DIAGNOSIS — M5442 Lumbago with sciatica, left side: Secondary | ICD-10-CM | POA: Diagnosis not present

## 2021-08-22 DIAGNOSIS — M5441 Lumbago with sciatica, right side: Secondary | ICD-10-CM | POA: Diagnosis not present

## 2021-08-22 DIAGNOSIS — R600 Localized edema: Secondary | ICD-10-CM | POA: Diagnosis not present

## 2021-08-22 DIAGNOSIS — M25512 Pain in left shoulder: Secondary | ICD-10-CM | POA: Insufficient documentation

## 2021-08-22 DIAGNOSIS — M25612 Stiffness of left shoulder, not elsewhere classified: Secondary | ICD-10-CM | POA: Insufficient documentation

## 2021-08-22 DIAGNOSIS — Z96612 Presence of left artificial shoulder joint: Secondary | ICD-10-CM | POA: Insufficient documentation

## 2021-08-22 NOTE — Therapy (Signed)
[facility] ?[place] ?[address] ?[place], [place], [address] ?Phone: [phone]   Fax:  [phone] ? ?Physical Therapy Treatment ? ?Patient Details  ?Name: [name] ?MRN: [id number] ?Date of Birth: [birth year] ?Referring Provider (PT): Mardelle Matte ? ? ?Encounter Date: 08/22/2021 ? ? PT End of Session - 08/22/21 1514   ? ? Visit Number 31   ? Date for PT Re-Evaluation 09/21/21   ? Authorization Type [employer]   ? Authorization Time Period 6/12   ? PT Start Time 9371   ? PT Stop Time 1528   ? PT Time Calculation (min) 55 min   ? Activity Tolerance Patient tolerated treatment well   ? Behavior During Therapy [facility] for tasks assessed/performed   ? ?  ?  ? ?  ? ? ?Past Medical History:  ?Diagnosis Date  ? Allergy   ? Anxiety   ? Arthritis   ? back & knee  ? Asthma   ?  mild per pt shows up with resp illness  ? Colon polyps   ? Constipation   ? Diabetes mellitus without complication ([place])   ? Gallstones   ? Gastric polyps   ? Gastroparesis   ? GERD (gastroesophageal reflux disease)   ? Headache   ? sinus headaches and migraines at times  ? Heart murmur   ? History of migraine headaches   ? HTN (hypertension)   ? Hyperlipidemia   ? IBS (irritable bowel syndrome)   ? Joint pain   ? Lumbar disc disease   ? PONV (postoperative nausea and vomiting)   ? S/P aortic valve replacement with bioprosthetic valve 08/23/2016  ? 25 mm Edwards Intuity rapid-deployment bovine pericardial tissue valve via partial upper mini sternotomy  ? Sleep apnea   ? CPAP  ? TIA (transient ischemic attack)   ? TIA (transient ischemic attack)   ? hx of per pt  ? ? ?Past Surgical History:  ?Procedure Laterality Date  ? ABDOMINAL HYSTERECTOMY    ? AORTIC VALVE REPLACEMENT N/A 08/23/2016  ? Procedure: AORTIC VALVE REPLACEMENT (AVR) - using partial Upper Sternotomy- 97m Edwards Intuity Aortic Valve used;  Surgeon: CRexene Alberts MD;  [facility]  Service: Open Heart Surgery;  Laterality: N/A;  ? BLADDER  SUSPENSION  2007  ? [facility] ? CARPAL TUNNEL RELEASE Bilateral   ? COLONOSCOPY    ? DORSAL COMPARTMENT RELEASE Right 09/15/2014  ? Procedure: RIGHT WRIST DEQUERVAINS RELEASE ;  Surgeon: DKathryne Hitch MD;  [facility]  Service: Orthopedics;  Laterality: Right;  ? ESOPHAGOGASTRODUODENOSCOPY    ? EYE SURGERY    ? cataract surgery  ? KNEE ARTHROSCOPY  04/15/2012  ? Procedure: ARTHROSCOPY KNEE;  Surgeon: DNinetta Lights MD;  [facility]  Service: Orthopedics;  Laterality: Right;  ? LAPAROSCOPIC CHOLECYSTECTOMY    ? LASIK    ? OOPHORECTOMY    ? PALATE TO GINGIVA GRAFT  2017  ? REVERSE SHOULDER ARTHROPLASTY Left 02/13/2021  ? Procedure: REVERSE SHOULDER ARTHROPLASTY;  Surgeon: LMarchia Bond MD;  [facility];  Service: Orthopedics;  Laterality: Left;  ? RIGHT/LEFT HEART CATH AND CORONARY ANGIOGRAPHY N/A 08/02/2016  ? Procedure: Right/Left Heart Cath and Coronary Angiography;  Surgeon: MSherren Mocha MD;  [facility];  Service: Cardiovascular;  Laterality: N/A;  ? ROTATOR CUFF REPAIR Left   ? TEE WITHOUT CARDIOVERSION N/A 08/23/2016  ? Procedure: TRANSESOPHAGEAL ECHOCARDIOGRAM (TEE);  Surgeon: CRexene Alberts  MD;  [facility];  Service: Open Heart Surgery;  Laterality: N/A;  ? TOTAL KNEE ARTHROPLASTY  04/15/2012  ? Procedure: TOTAL KNEE ARTHROPLASTY;  Surgeon: [name], MD;  Location: [facility];  Service: Orthopedics;  Laterality: Right;  ? TRIGGER FINGER RELEASE Right 04/20/2015  ? Procedure: RIGHT TRIGGER FINGER RELEASE (TENDON SHEALTH INCISION) ;  Surgeon: [name], MD;  Location: [facility];  Service: Orthopedics;  Laterality: Right;  ? ? ?There were no vitals filed for this visit. ? ? Subjective Assessment - 08/22/21 1440   ? ? Subjective A little sore but not bad   ? Currently in Pain? Yes   ? Pain Score 3    ? Pain Location Scapula   ? Pain Orientation Left   ? ?  ?  ? ?  ? ? ? ? ? ? ? ? ? ? ? ? ? ? ? ? ? ? ? ?  [facility] Adult PT Treatment/Exercise - 08/22/21 0001   ? ?  ? Shoulder Exercises: Seated  ? External Rotation Left;20 reps;Weights   ? External Rotation Weight (lbs) 2   ? External Rotation Limitations with arm at 90 degrees abduction   ?  ? Shoulder Exercises: Standing  ? Flexion Strengthening;Both;20 reps   ? Shoulder Flexion Weight (lbs) 3# stick   ? Extension Strengthening;Both;20 reps;Weights   ? Extension Weight (lbs) 3   ? Other Standing Exercises 3 # OH press and PNF 2 sets 10   ? Other Standing Exercises cabinet reaching 2# 2x10 flex & abd, Level 1 to 2 towards end range   ?  ? Vasopneumatic  ? Number Minutes Vasopneumatic  10 minutes   ? Vasopnuematic Location  Shoulder   ? Vasopneumatic Pressure Medium   ? Vasopneumatic Temperature  34   ?  ? Manual Therapy  ? Soft tissue mobilization scar mobilization as she has a little bit of a keloid, worked on the deltoid   ? Passive ROM PROM with end range stretch   ? ?  ?  ? ?  ? ? ? Trigger Point Dry Needling - 08/22/21 0001   ? ? Consent Given? Yes   ? Deltoid Response Twitch response elicited;Palpable increased muscle length   ? ?  ?  ? ?  ? ? ? ? ? ? ? ? ? ? PT Short Term Goals - 04/16/21 1510   ? ?  ? PT SHORT TERM GOAL #1  ? Title independent with intial HEP   ? Status Achieved   ? ?  ?  ? ?  ? ? ? ? PT Long Term Goals - 08/22/21 1516   ? ?  ? PT LONG TERM GOAL #1  ? Title decrease pain 50%   ? Status Achieved   ?  ? PT LONG TERM GOAL #2  ? Title returns safely to the gym   ? Status Partially Met   ?  ? PT LONG TERM GOAL #3  ? Title increase AROM of the left shoulder to 120 degrees flexion in standing   ? Status Achieved   ? ?  ?  ? ?  ? ? ? ? ? ? ? ? Plan - 08/22/21 1515   ? ? Clinical Impression Statement Patient had some soreness where I worked on her scapula and rhomboid area, I focused today on functional strength, some ROM and the deltoid and the scar, the proximal scar has some keloid look to it so did CFM  to this area., Tolerated well   ? PT Next Visit  Plan progress func and address TP with MFR and DN   ? Consulted and Agree with Plan of Care Patient   ? ?  ?  ? ?  ? ? ?Patient will benefit from skilled therapeutic intervention in order to improve the following deficits and impairments:  Decreased range of motion, Impaired UE functional use, Increased muscle spasms, Pain, Improper body mechanics, Postural dysfunction, Increased edema, Decreased strength, Decreased mobility ? ?Visit Diagnosis: ?Stiffness of left shoulder, not elsewhere classified ? ?Localized edema ? ?S/P reverse total shoulder arthroplasty, left ? ?Acute pain of left shoulder ? ? ? ? ?Problem List ?Patient Active Problem List  ? Diagnosis Date Noted  ? S/P reverse total shoulder arthroplasty, left 02/13/2021  ? Idiopathic hyperphosphatasia 08/03/2019  ? Vitamin D deficiency 06/04/2018  ? Hyponatremia 02/17/2018  ? History of CVA in adulthood 08/13/2017  ? Blurry vision 08/13/2017  ? Hypertension associated with diabetes ([place]) 01/02/2017  ? S/P minimally invasive aortic valve replacement with bioprosthetic valve 08/23/2016  ? Diastolic dysfunction   ? Osteopenia 05/27/2016  ? OSA (obstructive sleep apnea) 02/27/2016  ? Class 1 obesity with serious comorbidity and body mass index (BMI) of 32.0 to 32.9 in adult 10/23/2015  ? Bruxism 10/23/2015  ? Elevated alkaline phosphatase level 05/16/2015  ? Atypical chest pain 11/10/2014  ? Acne 05/12/2014  ? Diabetes mellitus ([place]) 05/12/2014  ? GERD (gastroesophageal reflux disease) 03/10/2014  ? Generalized anxiety disorder 03/10/2014  ? Migraines 03/10/2014  ? EUSTACHIAN TUBE DYSFUNCTION, CHRONIC 06/14/2010  ? Irritable bowel syndrome 12/15/2008  ? Low back pain 08/11/2008  ? Hyperlipidemia associated with type 2 diabetes mellitus ([place]) 02/09/2008  ? Allergic rhinitis 02/12/2007  ? Asthma 02/12/2007  ? ? Sumner Boast, PT ?08/22/2021, 3:17 PM ? ?[facility] ?[place] ?[address] ?[place], [place],  [address] ?Phone: [phone]   Fax:  [phone] ? ?Name: [name] ?MRN: [id number] ?Date of Birth: 02-11-1951 ? ? ? ?

## 2021-08-27 ENCOUNTER — Other Ambulatory Visit: Payer: Self-pay | Admitting: Cardiology

## 2021-08-27 DIAGNOSIS — T8203XA Leakage of heart valve prosthesis, initial encounter: Secondary | ICD-10-CM

## 2021-08-27 DIAGNOSIS — M5416 Radiculopathy, lumbar region: Secondary | ICD-10-CM | POA: Diagnosis not present

## 2021-08-27 DIAGNOSIS — M545 Low back pain, unspecified: Secondary | ICD-10-CM | POA: Diagnosis not present

## 2021-08-27 DIAGNOSIS — Z953 Presence of xenogenic heart valve: Secondary | ICD-10-CM

## 2021-08-28 ENCOUNTER — Other Ambulatory Visit: Payer: Self-pay

## 2021-08-28 ENCOUNTER — Encounter: Payer: Self-pay | Admitting: Physical Therapy

## 2021-08-28 ENCOUNTER — Ambulatory Visit: Payer: Medicare PPO | Admitting: Physical Therapy

## 2021-08-28 DIAGNOSIS — M5442 Lumbago with sciatica, left side: Secondary | ICD-10-CM

## 2021-08-28 DIAGNOSIS — M25612 Stiffness of left shoulder, not elsewhere classified: Secondary | ICD-10-CM | POA: Diagnosis not present

## 2021-08-28 DIAGNOSIS — R600 Localized edema: Secondary | ICD-10-CM | POA: Diagnosis not present

## 2021-08-28 DIAGNOSIS — M5441 Lumbago with sciatica, right side: Secondary | ICD-10-CM

## 2021-08-28 DIAGNOSIS — Z96612 Presence of left artificial shoulder joint: Secondary | ICD-10-CM | POA: Diagnosis not present

## 2021-08-28 DIAGNOSIS — M25512 Pain in left shoulder: Secondary | ICD-10-CM | POA: Diagnosis not present

## 2021-08-28 NOTE — Therapy (Signed)
[facility]. [place], [place], [address] Phone: [phone]   Fax:  [phone]  Physical Therapy Treatment  Patient Details  Name: [name] MRN: [id number] Date of Birth: 08-15-1950 Referring Provider (PT): Mardelle Matte   Encounter Date: 08/28/2021   PT End of Session - 08/28/21 1602     Visit Number 50    Date for PT Re-Evaluation 09/21/21    Authorization Type Hum[employer]    Authorization Time Period 7/12    PT Start Time 1527    PT Stop Time 1615    PT Time Calculation (min) 48 min    Activity Tolerance Patient tolerated treatment well    Behavior During Therapy WFL for tasks assessed/performed             Past Medical History:  Diagnosis Date   Allergy    Anxiety    Arthritis    back & knee   Asthma     mild per pt shows up with resp illness   Colon polyps    Constipation    Diabetes mellitus without complication (HCC)    Gallstones    Gastric polyps    Gastroparesis    GERD (gastroesophageal reflux disease)    Headache    sinus headaches and migraines at times   Heart murmur    History of migraine headaches    HTN (hypertension)    Hyperlipidemia    IBS (irritable bowel syndrome)    Joint pain    Lumbar disc disease    PONV (postoperative nausea and vomiting)    S/P aortic valve replacement with bioprosthetic valve 08/23/2016   25 mm Edwards Intuity rapid-deployment bovine pericardial tissue valve via partial upper mini sternotomy   Sleep apnea    CPAP   TIA (transient ischemic attack)    TIA (transient ischemic attack)    hx of per pt    Past Surgical History:  Procedure Laterality Date   ABDOMINAL HYSTERECTOMY     AORTIC VALVE REPLACEMENT N/A 08/23/2016   Procedure: AORTIC VALVE REPLACEMENT (AVR) - using partial Upper Sternotomy- 11m Edwards Intuity Aortic Valve used;  Surgeon: CRexene Alberts MD;  [facility]  Service: Open Heart Surgery;  Laterality: N/A;   BLADDER  SUSPENSION  2007   BREAST ENHANCEMENT SURGERY  1980   CARPAL TUNNEL RELEASE Bilateral    COLONOSCOPY     DORSAL COMPARTMENT RELEASE Right 09/15/2014   Procedure: RIGHT WRIST DEQUERVAINS RELEASE ;  Surgeon: DKathryne Hitch MD;  [facility]  Service: Orthopedics;  Laterality: Right;   ESOPHAGOGASTRODUODENOSCOPY     EYE SURGERY     cataract surgery   KNEE ARTHROSCOPY  04/15/2012   Procedure: ARTHROSCOPY KNEE;  Surgeon: DNinetta Lights MD;  [facility]  Service: Orthopedics;  Laterality: Right;   LAPAROSCOPIC CHOLECYSTECTOMY     LASIK     OOPHORECTOMY     PALATE TO GINGIVA GRAFT  2017   REVERSE SHOULDER ARTHROPLASTY Left 02/13/2021   Procedure: REVERSE SHOULDER ARTHROPLASTY;  Surgeon: LMarchia Bond MD;  [facility];  Service: Orthopedics;  Laterality: Left;   RIGHT/LEFT HEART CATH AND CORONARY ANGIOGRAPHY N/A 08/02/2016   Procedure: Right/Left Heart Cath and Coronary Angiography;  Surgeon: MSherren Mocha MD;  [facility];  Service: Cardiovascular;  Laterality: N/A;   ROTATOR CUFF REPAIR Left    TEE WITHOUT CARDIOVERSION N/A 08/23/2016   Procedure: TRANSESOPHAGEAL ECHOCARDIOGRAM (TEE);  Surgeon: CRexene Alberts  MD;  [facility];  Service: Open Heart Surgery;  Laterality: N/A;   TOTAL KNEE ARTHROPLASTY  04/15/2012   Procedure: TOTAL KNEE ARTHROPLASTY;  Surgeon: [name], MD;  Location: [facility];  Service: Orthopedics;  Laterality: Right;   TRIGGER FINGER RELEASE Right 04/20/2015   Procedure: RIGHT TRIGGER FINGER RELEASE (TENDON SHEALTH INCISION) ;  Surgeon: [name], MD;  Location: [facility];  Service: Orthopedics;  Laterality: Right;    There were no vitals filed for this visit.   Subjective Assessment - 08/28/21 1530     Subjective Patient saw her back MD yesterday, she comes with a new script for [name] to add PT to the lumbar spine, she has sponylosis and stenosis    Currently in Pain? Yes    Pain Score 4      Pain Location Back    Pain Orientation Lower    Pain Descriptors / Indicators Tender;Sore;Tightness    Pain Radiating Towards doe shave leg pain in a sciatic pattern she reports worse on the left    Aggravating Factors  pressure, bending, wakes her up at night 7/10    Pain Relieving Factors rest, movements, Aleve    Effect of Pain on Daily Activities reports difficulty with ADL's                [facility] PT Assessment - 08/28/21 0001       Assessment   Medical Diagnosis lumbar pain  order from [facility]      Posture/Postural Control   Posture Comments fwd head, rounded shoulders      ROM / Strength   AROM / PROM / Strength Strength      AROM   Overall AROM Comments lumbar ROM 25% limited      Strength   Overall Strength Comments hips and knees 4-/5 with some mild discomfort in the back and buttocks, very weak abs, I could not get any core activation      Flexibility   Soft Tissue Assessment /Muscle Length yes    Hamstrings very tight 50 degrees    Quadriceps very tight    ITB very tight    Piriformis tight      Palpation   Palpation comment very tight and tender in the lumbar area very tender in the buttocks, has pain in a sciatic pattern                           [facility] Adult PT Treatment/Exercise - 08/28/21 0001       Exercises   Exercises Lumbar      Lumbar Exercises: Aerobic   Nustep level 4 x 5 minutes      Lumbar Exercises: Supine   Pelvic Tilt 15 reps;5 seconds    Other Supine Lumbar Exercises feet on ball K2C, trunk rotation, small bridges , tried isometric aabs      Vasopneumatic   Number Minutes Vasopneumatic  10 minutes    Vasopnuematic Location  Shoulder    Vasopneumatic Pressure Medium    Vasopneumatic Temperature  34      Manual Therapy   Passive ROM PROM with end range stretch              Trigger Point Dry Needling - 08/28/21 0001     Consent Given? Yes    Upper Trapezius Response Twitch reponse elicited;Palpable  increased muscle length  PT Short Term Goals - 04/16/21 1510       PT SHORT TERM GOAL #1   Title independent with intial HEP    Status Achieved               PT Long Term Goals - 08/28/21 1605       PT LONG TERM GOAL #5   Title decrease lumbar pain and sciatic symptoms 50%    Time 8    Period Weeks    Status New                   Plan - 08/28/21 1602     Clinical Impression Statement Patient has a new order for lumbar stenosis and spondylosis fro Dr. Ron Agee, i did a quick evaluation of the lumbar spine as he wanted [name] to "add to shoulder treatment", she is tight has sciatic type symptoms in both legs, she is tight and tender in the buttocks and the ITB and HS.  She is doing well with her shoulder, just a little tight and with some difficulty doing her hair in the back    PT Next Visit Plan add some lumbar exercises and continue with shoulder    Consulted and Agree with Plan of Care Patient             Patient will benefit from skilled therapeutic intervention in order to improve the following deficits and impairments:  Decreased range of motion, Impaired UE functional use, Increased muscle spasms, Pain, Improper body mechanics, Postural dysfunction, Increased edema, Decreased strength, Decreased mobility  Visit Diagnosis: Stiffness of left shoulder, not elsewhere classified  Localized edema  S/P reverse total shoulder arthroplasty, left  Acute pain of left shoulder  Acute bilateral low back pain with bilateral sciatica     Problem List Patient Active Problem List   Diagnosis Date Noted   S/P reverse total shoulder arthroplasty, left 02/13/2021   Idiopathic hyperphosphatasia 08/03/2019   Vitamin D deficiency 06/04/2018   Hyponatremia 02/17/2018   History of CVA in adulthood 08/13/2017   Blurry vision 08/13/2017   Hypertension associated with diabetes ([place]) 01/02/2017   S/P minimally invasive aortic valve  replacement with bioprosthetic valve 22/07/5425   Diastolic dysfunction    Osteopenia 05/27/2016   OSA (obstructive sleep apnea) 02/27/2016   Class 1 obesity with serious comorbidity and body mass index (BMI) of 32.0 to 32.9 in adult 10/23/2015   Bruxism 10/23/2015   Elevated alkaline phosphatase level 05/16/2015   Atypical chest pain 11/10/2014   Acne 05/12/2014   Diabetes mellitus ([place]) 05/12/2014   GERD (gastroesophageal reflux disease) 03/10/2014   Generalized anxiety disorder 03/10/2014   Migraines 03/10/2014   EUSTACHIAN TUBE DYSFUNCTION, CHRONIC 06/14/2010   Irritable bowel syndrome 12/15/2008   Low back pain 08/11/2008   Hyperlipidemia associated with type 2 diabetes mellitus ([place]) 02/09/2008   Allergic rhinitis 02/12/2007   Asthma 02/12/2007    Sumner Boast, PT 08/28/2021, 4:06 PM  [place]. [place], [place], [address] Phone: [phone]   Fax:  [phone]  Name: ILYANA MANUELE MRN: [id number] Date of Birth: June 05, 1951

## 2021-08-30 ENCOUNTER — Other Ambulatory Visit: Payer: Self-pay

## 2021-08-30 ENCOUNTER — Ambulatory Visit (HOSPITAL_COMMUNITY): Payer: Medicare PPO | Attending: Cardiology

## 2021-08-30 DIAGNOSIS — T8203XA Leakage of heart valve prosthesis, initial encounter: Secondary | ICD-10-CM | POA: Insufficient documentation

## 2021-08-30 DIAGNOSIS — Z953 Presence of xenogenic heart valve: Secondary | ICD-10-CM

## 2021-08-30 DIAGNOSIS — G4733 Obstructive sleep apnea (adult) (pediatric): Secondary | ICD-10-CM

## 2021-08-30 LAB — ECHOCARDIOGRAM COMPLETE
AR max vel: 1.65 cm2
AV Area VTI: 1.78 cm2
AV Area mean vel: 1.73 cm2
AV Mean grad: 9 mmHg
AV Peak grad: 17.1 mmHg
Ao pk vel: 2.07 m/s
Area-P 1/2: 2.82 cm2
P 1/2 time: 350 msec
S' Lateral: 2.7 cm

## 2021-09-04 ENCOUNTER — Ambulatory Visit: Payer: Medicare PPO | Admitting: Physical Therapy

## 2021-09-04 ENCOUNTER — Other Ambulatory Visit: Payer: Self-pay

## 2021-09-04 ENCOUNTER — Encounter: Payer: Self-pay | Admitting: Physical Therapy

## 2021-09-04 DIAGNOSIS — Z96612 Presence of left artificial shoulder joint: Secondary | ICD-10-CM

## 2021-09-04 DIAGNOSIS — M25612 Stiffness of left shoulder, not elsewhere classified: Secondary | ICD-10-CM | POA: Diagnosis not present

## 2021-09-04 DIAGNOSIS — M5441 Lumbago with sciatica, right side: Secondary | ICD-10-CM | POA: Diagnosis not present

## 2021-09-04 DIAGNOSIS — M5442 Lumbago with sciatica, left side: Secondary | ICD-10-CM | POA: Diagnosis not present

## 2021-09-04 DIAGNOSIS — R600 Localized edema: Secondary | ICD-10-CM

## 2021-09-04 DIAGNOSIS — M25512 Pain in left shoulder: Secondary | ICD-10-CM | POA: Diagnosis not present

## 2021-09-04 NOTE — Therapy (Signed)
?[facility] ?[address] ?[place], [place], [address] ?Phone: [phone]   Fax:  [phone] ? ?Physical Therapy Treatment ? ?Patient Details  ?Name: [name] ?MRN: [id number] ?Date of Birth: 27-Sep-1950 ?Referring Provider (PT): Mardelle Matte ? ? ?Encounter Date: 09/04/2021 ? ? PT End of Session - 09/04/21 1539   ? ? Visit Number [id number]   ? Date for PT Re-Evaluation 09/21/21   ? PT Start Time 1500   ? PT Stop Time 1550   ? PT Time Calculation (min) 50 min   ? Activity Tolerance Patient tolerated treatment well   ? Behavior During Therapy [facility] for tasks assessed/performed   ? ?  ?  ? ?  ? ? ?Past Medical History:  ?Diagnosis Date  ? Allergy   ? Anxiety   ? Arthritis   ? back & knee  ? Asthma   ?  mild per pt shows up with resp illness  ? Colon polyps   ? Constipation   ? Diabetes mellitus without complication ([place])   ? Gallstones   ? Gastric polyps   ? Gastroparesis   ? GERD (gastroesophageal reflux disease)   ? Headache   ? sinus headaches and migraines at times  ? Heart murmur   ? History of migraine headaches   ? HTN (hypertension)   ? Hyperlipidemia   ? IBS (irritable bowel syndrome)   ? Joint pain   ? Lumbar disc disease   ? PONV (postoperative nausea and vomiting)   ? S/P aortic valve replacement with bioprosthetic valve 08/23/2016  ? 25 mm Edwards Intuity rapid-deployment bovine pericardial tissue valve via partial upper mini sternotomy  ? Sleep apnea   ? CPAP  ? TIA (transient ischemic attack)   ? TIA (transient ischemic attack)   ? hx of per pt  ? ? ?Past Surgical History:  ?Procedure Laterality Date  ? ABDOMINAL HYSTERECTOMY    ? AORTIC VALVE REPLACEMENT N/A 08/23/2016  ? Procedure: AORTIC VALVE REPLACEMENT (AVR) - using partial Upper Sternotomy- 50m Edwards Intuity Aortic Valve used;  Surgeon: CRexene Alberts MD;  [facility]  Service: Open Heart Surgery;  Laterality: N/A;  ? [place]USPENSION  2007  [place] ? CARPAL TUNNEL  RELEASE Bilateral   ? COLONOSCOPY    ? DORSAL COMPARTMENT RELEASE Right 09/15/2014  ? Procedure: RIGHT WRIST DEQUERVAINS RELEASE ;  Surgeon: DKathryne Hitch MD;  [facility]: [facility]  Service: Orthopedics;  Laterality: Right;  ? ESOPHAGOGASTRODUODENOSCOPY    ? EYE SURGERY    ? cataract surgery  ? KNEE ARTHROSCOPY  04/15/2012  ? Procedure: ARTHROSCOPY KNEE;  Surgeon: DNinetta Lights MD;  [facility] [facility]  Service: Orthopedics;  Laterality: Right;  ? LAPAROSCOPIC CHOLECYSTECTOMY    ? LASIK    ? OOPHORECTOMY    ? PALATE TO GINGIVA GRAFT  2017  ? REVERSE SHOULDER ARTHROPLASTY Left 02/13/2021  ? Procedure: REVERSE SHOULDER ARTHROPLASTY;  Surgeon: LMarchia Bond MD;  [facility];  Service: Orthopedics;  Laterality: Left;  ? RIGHT/LEFT HEART CATH AND CORONARY ANGIOGRAPHY N/A 08/02/2016  ? Procedure: Right/Left Heart Cath and Coronary Angiography;  Surgeon: MSherren Mocha MD;  [facility];  Service: Cardiovascular;  Laterality: N/A;  ? ROTATOR CUFF REPAIR Left   ? TEE WITHOUT CARDIOVERSION N/A 08/23/2016  ? Procedure: TRANSESOPHAGEAL ECHOCARDIOGRAM (TEE);  Surgeon: CRexene Alberts MD;  [facility]  Service: Open Heart Surgery;  Laterality: N/A;  ?  TOTAL KNEE ARTHROPLASTY  04/15/2012  ? Procedure: TOTAL KNEE ARTHROPLASTY;  Surgeon: [name], MD;  Location: [facility];  Service: Orthopedics;  Laterality: Right;  ? TRIGGER FINGER RELEASE Right 04/20/2015  ? Procedure: RIGHT TRIGGER FINGER RELEASE (TENDON SHEALTH INCISION) ;  Surgeon: [name], MD;  Location: [facility];  Service: Orthopedics;  Laterality: Right;  ? ? ?There were no vitals filed for this visit. ? ? Subjective Assessment - 09/04/21 1510   ? ? Subjective "Pretty good" just the cold weather bothers her shoulder.   ? Currently in Pain? No/denies   ? ?  ?  ? ?  ? ? ? ? ? ? ? ? ? ? ? ? ? ? ? ? ? ? ? ? [facility] PT Treatment/Exercise - 09/04/21 0001   ? ?  ? Lumbar Exercises: Stretches  ? Single Knee to  Chest Stretch Left;Right;3 reps;10 seconds   ? Figure 4 Stretch 3 reps;20 seconds;10 seconds   ?  ? Lumbar Exercises: Aerobic  ? Nustep level 4 x 5 minutes   ?  ? Shoulder Exercises: Seated  ? External Rotation Left;20 reps;Weights   ? Theraband Level (Shoulder External Rotation) Level 1 (Yellow)   ?  ? Shoulder Exercises: Standing  ? Flexion Strengthening;Both;20 reps   ? Shoulder Flexion Weight (lbs) 3# stick   ? Extension Strengthening;Both;20 reps;Weights   ? Extension Weight (lbs) 3   ?  ? Shoulder Exercises: ROM/Strengthening  ? UBE (Upper Arm Bike) L 2 3 min fwd/3 min back   ?  ? Vasopneumatic  ? Number Minutes Vasopneumatic  10 minutes   ? Vasopnuematic Location  Shoulder   ? Vasopneumatic Pressure Medium   ? Vasopneumatic Temperature  34   ?  ? Manual Therapy  ? Soft tissue mobilization to the left upper trap and deltoid   ? ?  ?  ? ?  ? ? ? Trigger Point Dry Needling - 09/04/21 0001   ? ? Consent Given? Yes   ? Upper Trapezius Response Twitch reponse elicited;Palpable increased muscle length   ? Deltoid Response Twitch response elicited;Palpable increased muscle length   ? ?  ?  ? ?  ? ? ? ? ? ? ? ? ? ? PT Short Term Goals - 04/16/21 1510   ? ?  ? PT SHORT TERM GOAL #1  ? Title independent with intial HEP   ? Status Achieved   ? ?  ?  ? ?  ? ? ? ? PT Long Term Goals - 08/28/21 1605   ? ?  ? PT LONG TERM GOAL #5  ? Title decrease lumbar pain and sciatic symptoms 50%   ? Time 8   ? Period Weeks   ? Status New   ? ?  ?  ? ?  ? ? ? ? ? ? ? ? Plan - 09/04/21 1541   ? ? Clinical Impression Statement Pt did well tolerating today's interventions. interventions focused on posterior chain strengthening and UE AROM. weakness present with supine bridges LE on Pball. Tactile cue to elbows for UE positioning needed with external rotation. Pt had good LE flexibility noted with stretching. Lead PT assisted in treatment providing DN to PT.   ? Stability/Clinical Decision Making Evolving/Moderate complexity   ? Rehab  Potential Good   ? PT Frequency 2x / week   ? PT Duration 12 weeks   ? PT Treatment/Interventions ADLs/Self Care Home Management;Cryotherapy;Electrical Stimulation;Therapeutic activities;Therapeutic exercise;Neuromuscular re-education;Manual techniques;Patient/family  education;Cognitive remediation;Vasopneumatic Device   ? PT Next Visit Plan add some lumbar exercises and continue with shoulder   ? ?  ?  ? ?  ? ? ?Patient will benefit from skilled therapeutic intervention in order to improve the following deficits and impairments:  Decreased range of motion, Impaired UE functional use, Increased muscle spasms, Pain, Improper body mechanics, Postural dysfunction, Increased edema, Decreased strength, Decreased mobility ? ?Visit Diagnosis: ?Acute bilateral low back pain with bilateral sciatica ? ?S/P reverse total shoulder arthroplasty, left ? ?Localized edema ? ?Stiffness of left shoulder, not elsewhere classified ? ? ? ? ?Problem List ?Patient Active Problem List  ? Diagnosis Date Noted  ? S/P reverse total shoulder arthroplasty, left 02/13/2021  ? Idiopathic hyperphosphatasia 08/03/2019  ? Vitamin D deficiency 06/04/2018  ? Hyponatremia 02/17/2018  ? History of CVA in adulthood 08/13/2017  ? Blurry vision 08/13/2017  ? Hypertension associated with diabetes ([place]) 01/02/2017  ? S/P minimally invasive aortic valve replacement with bioprosthetic valve 08/23/2016  ? Diastolic dysfunction   ? Osteopenia 05/27/2016  ? OSA (obstructive sleep apnea) 02/27/2016  ? Class 1 obesity with serious comorbidity and body mass index (BMI) of 32.0 to 32.9 in adult 10/23/2015  ? Bruxism 10/23/2015  ? Elevated alkaline phosphatase level 05/16/2015  ? Atypical chest pain 11/10/2014  ? Acne 05/12/2014  ? Diabetes mellitus ([place]) 05/12/2014  ? GERD (gastroesophageal reflux disease) 03/10/2014  ? Generalized anxiety disorder 03/10/2014  ? Migraines 03/10/2014  ? EUSTACHIAN TUBE DYSFUNCTION, CHRONIC 06/14/2010  ? Irritable bowel syndrome  12/15/2008  ? Low back pain 08/11/2008  ? Hyperlipidemia associated with type 2 diabetes mellitus ([place]) 02/09/2008  ? Allergic rhinitis 02/12/2007  ? Asthma 02/12/2007  ? ? ?Scot Jun, PTA ?09/04/2021, 3

## 2021-09-06 ENCOUNTER — Encounter: Payer: Self-pay | Admitting: Physical Therapy

## 2021-09-06 ENCOUNTER — Other Ambulatory Visit: Payer: Self-pay

## 2021-09-06 ENCOUNTER — Ambulatory Visit: Payer: Medicare PPO | Admitting: Physical Therapy

## 2021-09-06 DIAGNOSIS — Z96612 Presence of left artificial shoulder joint: Secondary | ICD-10-CM

## 2021-09-06 DIAGNOSIS — M25612 Stiffness of left shoulder, not elsewhere classified: Secondary | ICD-10-CM

## 2021-09-06 DIAGNOSIS — M5441 Lumbago with sciatica, right side: Secondary | ICD-10-CM | POA: Diagnosis not present

## 2021-09-06 DIAGNOSIS — M25512 Pain in left shoulder: Secondary | ICD-10-CM | POA: Diagnosis not present

## 2021-09-06 DIAGNOSIS — R600 Localized edema: Secondary | ICD-10-CM

## 2021-09-06 DIAGNOSIS — M5442 Lumbago with sciatica, left side: Secondary | ICD-10-CM | POA: Diagnosis not present

## 2021-09-06 NOTE — Therapy (Signed)
[facility] ?[place] [address] ?[place], [place], [address] ?Phone: [phone]   Fax:  [phone] ? ?Physical Therapy Treatment ? ?Patient Details  ?Name: [name] ?MRN: [id number] ?Date of Birth: [birth year] ?Referring Provider (PT): Mardelle Matte ? ? ?Encounter Date: 09/06/2021 ? ? PT End of Session - 09/06/21 1555   ? ? Visit Number 34   ? Date for PT Re-Evaluation 09/21/21   ? Authorization Type [employer]   ? PT Start Time 1513   ? PT Stop Time 1600   ? PT Time Calculation (min) 47 min   ? Activity Tolerance Patient tolerated treatment well   ? Behavior During Therapy [facility] for tasks assessed/performed   ? ?  ?  ? ?  ? ? ?Past Medical History:  ?Diagnosis Date  ? Allergy   ? Anxiety   ? Arthritis   ? back & knee  ? Asthma   ?  mild per pt shows up with resp illness  ? Colon polyps   ? Constipation   ? Diabetes mellitus without complication ([place])   ? Gallstones   ? Gastric polyps   ? Gastroparesis   ? GERD (gastroesophageal reflux disease)   ? Headache   ? sinus headaches and migraines at times  ? Heart murmur   ? History of migraine headaches   ? HTN (hypertension)   ? Hyperlipidemia   ? IBS (irritable bowel syndrome)   ? Joint pain   ? Lumbar disc disease   ? PONV (postoperative nausea and vomiting)   ? S/P aortic valve replacement with bioprosthetic valve 08/23/2016  ? 25 mm Edwards Intuity rapid-deployment bovine pericardial tissue valve via partial upper mini sternotomy  ? Sleep apnea   ? CPAP  ? TIA (transient ischemic attack)   ? TIA (transient ischemic attack)   ? hx of per pt  ? ? ?Past Surgical History:  ?Procedure Laterality Date  ? ABDOMINAL HYSTERECTOMY    ? AORTIC VALVE REPLACEMENT N/A 08/23/2016  ? Procedure: AORTIC VALVE REPLACEMENT (AVR) - using partial Upper Sternotomy- 23m Edwards Intuity Aortic Valve used;  Surgeon: CRexene Alberts MD;  [facility]  Service: Open Heart Surgery;  Laterality: N/A;  ? [place]ADDER SUSPENSION  2007  ? [place] ? CARPAL TUNNEL RELEASE Bilateral   ? COLONOSCOPY    ? DORSAL COMPARTMENT RELEASE Right 09/15/2014  ? Procedure: RIGHT WRIST DEQUERVAINS RELEASE ;  Surgeon: DKathryne Hitch MD;  [facility]  Service: Orthopedics;  Laterality: Right;  ? ESOPHAGOGASTRODUODENOSCOPY    ? EYE SURGERY    ? cataract surgery  ? KNEE ARTHROSCOPY  04/15/2012  ? Procedure: ARTHROSCOPY KNEE;  Surgeon: DNinetta Lights MD;  Location: [facility]  Service: Orthopedics;  Laterality: Right;  ? LAPAROSCOPIC CHOLECYSTECTOMY    ? LASIK    ? OOPHORECTOMY    ? PALATE TO GINGIVA GRAFT  2017  ? REVERSE SHOULDER ARTHROPLASTY Left 02/13/2021  ? Procedure: REVERSE SHOULDER ARTHROPLASTY;  Surgeon: LMarchia Bond MD;  [facility];  Service: Orthopedics;  Laterality: Left;  ? RIGHT/LEFT HEART CATH AND CORONARY ANGIOGRAPHY N/A 08/02/2016  ? Procedure: Right/Left Heart Cath and Coronary Angiography;  Surgeon: MSherren Mocha MD;  [facility];  Service: Cardiovascular;  Laterality: N/A;  ? ROTATOR CUFF REPAIR Left   ? TEE WITHOUT CARDIOVERSION N/A 08/23/2016  ? Procedure: TRANSESOPHAGEAL ECHOCARDIOGRAM (TEE);  Surgeon: CRexene Alberts MD;  [facility]  Service:  Open Heart Surgery;  Laterality: N/A;  ? TOTAL KNEE ARTHROPLASTY  04/15/2012  ? Procedure: TOTAL KNEE ARTHROPLASTY;  Surgeon: [name], MD;  Location: [facility];  Service: Orthopedics;  Laterality: Right;  ? TRIGGER FINGER RELEASE Right 04/20/2015  ? Procedure: RIGHT TRIGGER FINGER RELEASE (TENDON SHEALTH INCISION) ;  Surgeon: [name], MD;  Location: [facility];  Service: Orthopedics;  Laterality: Right;  ? ? ?There were no vitals filed for this visit. ? ? Subjective Assessment - 09/06/21 1511   ? ? Subjective "Pretty good" Shoulder is a little stiff, back is pretty good still have pain going down L leg   ? Currently in Pain? No/denies   ? ?  ?  ? ?  ? ? ? ? ? ? ? ? ? ? ? ? ? ? ? ? ? ? ? ? [facility] Adult PT Treatment/Exercise -  09/06/21 0001   ? ?  ? Lumbar Exercises: Stretches  ? Active Hamstring Stretch Left;Right;3 reps;10 seconds   ? Single Knee to Chest Stretch Left;Right;3 reps;10 seconds   ? Double Knee to Chest Stretch 2 reps;10 seconds   ? Lower Trunk Rotation 2 reps;10 seconds   ? Figure 4 Stretch 3 reps;20 seconds;10 seconds   ?  ? Lumbar Exercises: Supine  ? Bridge Compliant;20 reps   ? Other Supine Lumbar Exercises feet on ball K2C, trunk rotation, small bridges , tried isometric aabs   ?  ? Shoulder Exercises: Standing  ? External Rotation Both;20 reps;Theraband   ? Theraband Level (Shoulder External Rotation) Level 1 (Yellow)   ? Flexion Strengthening;Both;20 reps   ? Shoulder Flexion Weight (lbs) 3# stick   ? Extension Strengthening;Both;20 reps;Weights   ? Extension Weight (lbs) 5   ? Row Strengthening;Both;20 reps;Weights   ? Row Weight (lbs) 10   ?  ? Shoulder Exercises: ROM/Strengthening  ? UBE (Upper Arm Bike) L 2 3 min fwd/3 min back   ? Other ROM/Strengthening Exercises Rows & Lats 20lb 2x10   ? Other ROM/Strengthening Exercises Chest press 5lb 2x10   ?  ? Vasopneumatic  ? Number Minutes Vasopneumatic  10 minutes   ? Vasopnuematic Location  Shoulder   ? Vasopneumatic Pressure Medium   ? Vasopneumatic Temperature  34   ?  ? Manual Therapy  ? Soft tissue mobilization upper trap, rhomboids   ? ?  ?  ? ?  ? ? ? ? ? ? ? ? ? ? ? ? PT Short Term Goals - 09/06/21 1555   ? ?  ? PT SHORT TERM GOAL #1  ? Title independent with intial HEP   ? Status Achieved   ? ?  ?  ? ?  ? ? ? ? PT Long Term Goals - 09/06/21 1556   ? ?  ? PT LONG TERM GOAL #1  ? Title decrease pain 50%   ? Status Achieved   ?  ? PT LONG TERM GOAL #2  ? Title returns safely to the gym   ? Status Partially Met   ?  ? PT LONG TERM GOAL #3  ? Title increase AROM of the left shoulder to 120 degrees flexion in standing   ? Status Achieved   ?  ? PT LONG TERM GOAL #4  ? Title report no difficulty dressing or doing hair   ? Status Partially Met   ?  ? PT LONG TERM  GOAL #5  ? Title decrease lumbar pain and sciatic symptoms 50%   ?  Status On-going   ? ?  ?  ? ?  ? ? ? ? ? ? ? ? Plan - 09/06/21 1556   ? ? Clinical Impression Statement Pt enters clinic with repots of her feeling fine. All interventions completed well. Some compensation noted with standing shoulder flexion. Cue to increase ROM with shoulder extensions. Core weakness present with supine bridges LE on Pball, assist needed to maintain stability.   ? Stability/Clinical Decision Making Evolving/Moderate complexity   ? PT Frequency 2x / week   ? PT Duration 12 weeks   ? PT Treatment/Interventions ADLs/Self Care Home Management;Cryotherapy;Electrical Stimulation;Therapeutic activities;Therapeutic exercise;Neuromuscular re-education;Manual techniques;Patient/family education;Cognitive remediation;Vasopneumatic Device   ? PT Next Visit Plan add some lumbar exercises and continue with shoulder   ? ?  ?  ? ?  ? ? ?Patient will benefit from skilled therapeutic intervention in order to improve the following deficits and impairments:  Decreased range of motion, Impaired UE functional use, Increased muscle spasms, Pain, Improper body mechanics, Postural dysfunction, Increased edema, Decreased strength, Decreased mobility ? ?Visit Diagnosis: ?Acute bilateral low back pain with bilateral sciatica ? ?Localized edema ? ?S/P reverse total shoulder arthroplasty, left ? ?Stiffness of left shoulder, not elsewhere classified ? ? ? ? ?Problem List ?Patient Active Problem List  ? Diagnosis Date Noted  ? S/P reverse total shoulder arthroplasty, left 02/13/2021  ? Idiopathic hyperphosphatasia 08/03/2019  ? Vitamin D deficiency 06/04/2018  ? Hyponatremia 02/17/2018  ? History of CVA in adulthood 08/13/2017  ? Blurry vision 08/13/2017  ? Hypertension associated with diabetes ([place]) 01/02/2017  ? S/P minimally invasive aortic valve replacement with bioprosthetic valve 08/23/2016  ? Diastolic dysfunction   ? Osteopenia 05/27/2016  ? OSA  (obstructive sleep apnea) 02/27/2016  ? Class 1 obesity with serious comorbidity and body mass index (BMI) of 32.0 to 32.9 in adult 10/23/2015  ? Bruxism 10/23/2015  ? Elevated alkaline phosphatase level 11/22/201

## 2021-09-10 NOTE — Progress Notes (Deleted)
?Cardiology Office Note:   ? ?Date:  09/10/2021  ? ?ID:  [name], DOB [birth year], MRN [id number] ? ?PCP:  [name], MD ?  ?[facility] Providers ?Cardiologist:  Freada Bergeron, MD { ? ? ?Referring MD: [name], MD  ? ? ?History of Present Illness:   ? ?[name] is a 71 y.o. female with a hx of DMII, HLD, OSA on CPAP, and severe AS s/p bioprosthetic AVR in 08/2016 who was previously followed by Dr. Meda Coffee who now returns to clinic for follow-up. ? ?Per review of the record, the patient has a history of aortic valve replacement with bioprosthetic bovine pericardial tissue valve 08/23/16. Pre-op cath with only minor nonobstructive CAD. Also right heart cath showed normal filling pressures. Echocardiogram showed normal EF of 60-65% and grade 1 diastolic dysfunction.  The patient was very motivated post surgery, underwent complete rehab program and dietary program and has lost 30 pounds.  ? ?Last saw Dr. Meda Coffee in 08/2020 where she was doing great with no HF or anginal symptoms. ? ?Today, *** ? ?Past Medical History:  ?Diagnosis Date  ? Allergy   ? Anxiety   ? Arthritis   ? back & knee  ? Asthma   ?  mild per pt shows up with resp illness  ? Colon polyps   ? Constipation   ? Diabetes mellitus without complication ([place])   ? Gallstones   ? Gastric polyps   ? Gastroparesis   ? GERD (gastroesophageal reflux disease)   ? Headache   ? sinus headaches and migraines at times  ? Heart murmur   ? History of migraine headaches   ? HTN (hypertension)   ? Hyperlipidemia   ? IBS (irritable bowel syndrome)   ? Joint pain   ? Lumbar disc disease   ? PONV (postoperative nausea and vomiting)   ? S/P aortic valve replacement with bioprosthetic valve 08/23/2016  ? 25 mm Edwards Intuity rapid-deployment bovine pericardial tissue valve via partial upper mini sternotomy  ? Sleep apnea   ? CPAP  ? TIA (transient ischemic attack)   ? TIA (transient ischemic attack)   ? hx of per pt  ? ? ?Past Surgical  History:  ?Procedure Laterality Date  ? ABDOMINAL HYSTERECTOMY    ? AORTIC VALVE REPLACEMENT N/A 08/23/2016  ? Procedure: AORTIC VALVE REPLACEMENT (AVR) - using partial Upper Sternotomy- 58m Edwards Intuity Aortic Valve used;  Surgeon: CRexene Alberts MD;  [facility]  Service: Open Heart Surgery;  Laterality: N/A;  ? BLADDER SUSPENSION  2007  ? [place] ? CARPAL TUNNEL RELEASE Bilateral   ? COLONOSCOPY    ? DORSAL COMPARTMENT RELEASE Right 09/15/2014  ? Procedure: RIGHT WRIST DEQUERVAINS RELEASE ;  Surgeon: DKathryne Hitch MD;  Location[facility]  Service: Orthopedics;  Laterality: Right;  ? ESOPHAGOGASTRODUODENOSCOPY    ? EYE SURGERY    ? cataract surgery  ? KNEE ARTHROSCOPY  04/15/2012  ? Procedure: ARTHROSCOPY KNEE;  Surgeon: DNinetta Lights MD;  [facility]: [facility]  Service: Orthopedics;  Laterality: Right;  ? LAPAROSCOPIC CHOLECYSTECTOMY    ? LASIK    ? OOPHORECTOMY    ? PALATE TO GINGIVA GRAFT  2017  ? REVERSE SHOULDER ARTHROPLASTY Left 02/13/2021  ? Procedure: REVERSE SHOULDER ARTHROPLASTY;  Surgeon: LMarchia Bond MD;  [facility];  Service: Orthopedics;  Laterality: Left;  ? RIGHT/LEFT HEART CATH AND CORONARY ANGIOGRAPHY N/A 08/02/2016  ? Procedure: Right/Left  Heart Cath and Coronary Angiography;  Surgeon: Sherren Mocha, MD;  Location: [facility];  Service: Cardiovascular;  Laterality: N/A;  ? ROTATOR CUFF REPAIR Left   ? TEE WITHOUT CARDIOVERSION N/A 08/23/2016  ? Procedure: TRANSESOPHAGEAL ECHOCARDIOGRAM (TEE);  Surgeon: Rexene Alberts, MD;  Location: [facility];  Service: Open Heart Surgery;  Laterality: N/A;  ? TOTAL KNEE ARTHROPLASTY  04/15/2012  ? Procedure: TOTAL KNEE ARTHROPLASTY;  Surgeon: [name], MD;  Location: [facility];  Service: Orthopedics;  Laterality: Right;  ? TRIGGER FINGER RELEASE Right 04/20/2015  ? Procedure: RIGHT TRIGGER FINGER RELEASE (TENDON SHEALTH INCISION) ;  Surgeon: [name], MD;  Location: [facility];  Service: Orthopedics;  Laterality: Right;  ? ? ?Current Medications: ?No outpatient medications have been marked as taking for the 09/13/21 encounter (Appointment) with Freada Bergeron, MD.  ?  ? ?Allergies:   Augmentin [amoxicillin-pot clavulanate], Erythromycin, and Penicillins  ? ?Social History  ? ?Socioeconomic History  ? Marital status: Married  ?  Spouse name: [name]  ? Number of children: 1  ? Years of education: Not on file  ? Highest education level: Not on file  ?Occupational History  ? Occupation: Retired, Advice worker  ?  Employer: RETIRED  ?Tobacco Use  ? Smoking status: Never  ? Smokeless tobacco: Never  ?Vaping Use  ? Vaping Use: Never used  ?Substance and Sexual Activity  ? Alcohol use: No  ? Drug use: No  ? Sexual activity: Yes  ?Other Topics Concern  ? Not on file  ?Social History Narrative  ? She lives with husband (1978) and two dogs. Step-son [name] (1971-psychiatrist in [place]). No grandkids. 2 dogs-sheltie/collie mix and border collie mix  ?   ? Highest level of education:  Master in education  ? She is retired Animal nutritionist x 32 years.  ?   ? Hobbies: time with dogs  ? Right handed  ? One story home  ?   ?   ? ?Social Determinants of Health  ? ?Financial Resource Strain: Not on file  ?Food Insecurity: Not on file  ?Transportation Needs: Not on file  ?Physical Activity: Not on file  ?Stress: Not on file  ?Social Connections: Not on file  ?  ? ?Family History: ?The patient's ***family history includes Alcohol abuse in her father; Anxiety disorder in her mother; Breast cancer in her maternal grandmother; COPD in her mother; Colon polyps in her maternal aunt and mother; Diabetes in her paternal uncle; Gallbladder disease in her maternal grandmother; Heart disease in her father; Irritable bowel syndrome in her mother. There is no history of Other, Colon cancer, Esophageal cancer, Rectal cancer, or Stomach cancer. ? ?ROS:   ?Please see the history of present illness.     ?*** All other systems reviewed and are negative. ? ?EKGs/Labs/Other Studies Reviewed:   ? ?The following studies were reviewed today: ?Right/Left Heart Cath and Coronary Angiography 08/02/16  ?  ?1. Minor nonobstructive CAD ?2. Normal right heart hemodynamics (including normal PCWP) ?3. Known severe aortic stenosis  ?   ?Intraoperative TEE 08/23/16 ?Left ventricle: Normal cavity size. Concentric hypertrophy of moderate severity. LV systolic function is normal with an EF of 60-65%. There are no obvious wall motion abnormalities. ?Septum: No Patent Foramen Ovale present. ?Left atrium: Patent foramen ovale not present. ?Aortic valve: The valve is bicuspid. Severe valve thickening present. Severe valve calcification present. Moderately decreased leaflet separation. Severe stenosis. No regurgitation. No AV vegetation. No  evidence of papillary fibroelastoma. ?Aorta: The ascending aorta is mildly dilated. ?Mitral valve: No leaflet thickening and calcification present. Mild mitral annular calcification. ?Right ventricle: Normal cavity size, wall thickness and ejection fraction. ?Tricuspid valve: Trace regurgitation. The tricuspid valve regurgitation jet is central. ?  ?  ?TTE: 08/26/2017  ?  ?Left ventricle: The cavity size was mildly dilated. Wall ?  thickness was increased in a pattern of mild LVH. Systolic ?  function was normal. The estimated ejection fraction was in the ?  range of 60% to 65%. Left ventricular diastolic function ?  parameters were normal. ?- Aortic valve: patient had a Edwards 25 mm Intuity Elite rapid ?  deployment bovine pericardial valve placed 09/12/16. 05/01/17 mean ?  gradient was 6 mmHg peak gradient 12 mmHg. Gradients have ?  increased now 17 mmHg and 34 mmHg peak. There is a mild leak seen ?  through the basal stented portion of the valve appears at around ?  10:00 on BSA views ?- Mitral valve: There was mild regurgitation. ?- Atrial septum: No defect or patent foramen ovale was identified. ?   ?TTE12/21/2020  ?  ?1. Left ventricular ejection fraction, by visual estimation, is 60 to  ?65%. The left ventricle has normal function. There is no left ventricular  ?hypertrophy.  ? 2. Abnormal septal motion c

## 2021-09-11 ENCOUNTER — Other Ambulatory Visit: Payer: Self-pay

## 2021-09-11 ENCOUNTER — Ambulatory Visit: Payer: Medicare PPO | Admitting: Physical Therapy

## 2021-09-11 ENCOUNTER — Encounter: Payer: Self-pay | Admitting: Physical Therapy

## 2021-09-11 ENCOUNTER — Ambulatory Visit (INDEPENDENT_AMBULATORY_CARE_PROVIDER_SITE_OTHER): Payer: Medicare PPO | Admitting: Physician Assistant

## 2021-09-11 ENCOUNTER — Encounter (INDEPENDENT_AMBULATORY_CARE_PROVIDER_SITE_OTHER): Payer: Self-pay | Admitting: Physician Assistant

## 2021-09-11 VITALS — BP 132/66 | HR 67 | Temp 98.9°F | Ht 66.0 in | Wt 192.0 lb

## 2021-09-11 DIAGNOSIS — R600 Localized edema: Secondary | ICD-10-CM

## 2021-09-11 DIAGNOSIS — Z96612 Presence of left artificial shoulder joint: Secondary | ICD-10-CM | POA: Diagnosis not present

## 2021-09-11 DIAGNOSIS — E669 Obesity, unspecified: Secondary | ICD-10-CM

## 2021-09-11 DIAGNOSIS — M5441 Lumbago with sciatica, right side: Secondary | ICD-10-CM

## 2021-09-11 DIAGNOSIS — M25512 Pain in left shoulder: Secondary | ICD-10-CM | POA: Diagnosis not present

## 2021-09-11 DIAGNOSIS — E1169 Type 2 diabetes mellitus with other specified complication: Secondary | ICD-10-CM

## 2021-09-11 DIAGNOSIS — Z7984 Long term (current) use of oral hypoglycemic drugs: Secondary | ICD-10-CM

## 2021-09-11 DIAGNOSIS — G4733 Obstructive sleep apnea (adult) (pediatric): Secondary | ICD-10-CM

## 2021-09-11 DIAGNOSIS — Z6831 Body mass index (BMI) 31.0-31.9, adult: Secondary | ICD-10-CM

## 2021-09-11 DIAGNOSIS — M25612 Stiffness of left shoulder, not elsewhere classified: Secondary | ICD-10-CM | POA: Diagnosis not present

## 2021-09-11 DIAGNOSIS — M5442 Lumbago with sciatica, left side: Secondary | ICD-10-CM | POA: Diagnosis not present

## 2021-09-11 MED ORDER — METFORMIN HCL 500 MG PO TABS: 500.0000 mg | ORAL_TABLET | Freq: Every day | ORAL | 1 refills | Status: DC

## 2021-09-11 NOTE — Therapy (Signed)
?[facility] ?[address] ?[place], [place], [address] ?Phone: [phone]   Fax:  [phone] ? ?Physical Therapy Treatment ? ?Patient Details  ?Name: [name] ?MRN: [id number] ?Date of Birth: 06/09/1951 ?Referring Provider (PT): Mardelle Matte ? ? ?Encounter Date: 09/11/2021 ? ? PT End of Session - 09/11/21 1614   ? ? Visit Number 35   ? Date for PT Re-Evaluation 09/21/21   ? Authorization Type Humana   ? Authorization Time Period 10/12   ? PT Start Time 1440   ? PT Stop Time 1523   ? PT Time Calculation (min) 43 min   ? Activity Tolerance Patient tolerated treatment well   ? Behavior During Therapy [facility] for tasks assessed/performed   ? ?  ?  ? ?  ? ? ?Past Medical History:  ?Diagnosis Date  ? Allergy   ? Anxiety   ? Arthritis   ? back & knee  ? Asthma   ?  mild per pt shows up with resp illness  ? Colon polyps   ? Constipation   ? Diabetes mellitus without complication ([place])   ? Gallstones   ? Gastric polyps   ? Gastroparesis   ? GERD (gastroesophageal reflux disease)   ? Headache   ? sinus headaches and migraines at times  ? Heart murmur   ? History of migraine headaches   ? HTN (hypertension)   ? Hyperlipidemia   ? IBS (irritable bowel syndrome)   ? Joint pain   ? Lumbar disc disease   ? PONV (postoperative nausea and vomiting)   ? S/P aortic valve replacement with bioprosthetic valve 08/23/2016  ? 25 mm Edwards Intuity rapid-deployment bovine pericardial tissue valve via partial upper mini sternotomy  ? Sleep apnea   ? CPAP  ? TIA (transient ischemic attack)   ? TIA (transient ischemic attack)   ? hx of per pt  ? ? ?Past Surgical History:  ?Procedure Laterality Date  ? ABDOMINAL HYSTERECTOMY    ? AORTIC VALVE REPLACEMENT N/A 08/23/2016  ? Procedure: AORTIC VALVE REPLACEMENT (AVR) - using partial Upper Sternotomy- 80m Edwards Intuity Aortic Valve used;  Surgeon: CRexene Alberts MD;  [facility]  Service: Open Heart Surgery;  Laterality: N/A;  ? BLADDER  SUSPENSION  2007  ? [place] ? CARPAL TUNNEL RELEASE Bilateral   ? COLONOSCOPY    ? DORSAL COMPARTMENT RELEASE Right 09/15/2014  ? Procedure: RIGHT WRIST DEQUERVAINS RELEASE ;  Surgeon: DKathryne Hitch MD;  [facility] [facility]  Service: Orthopedics;  Laterality: Right;  ? ESOPHAGOGASTRODUODENOSCOPY    ? EYE SURGERY    ? cataract surgery  ? KNEE ARTHROSCOPY  04/15/2012  ? Procedure: ARTHROSCOPY KNEE;  Surgeon: DNinetta Lights MD;  [place] [facility]  Service: Orthopedics;  Laterality: Right;  ? LAPAROSCOPIC CHOLECYSTECTOMY    ? LASIK    ? OOPHORECTOMY    ? PALATE TO GINGIVA GRAFT  2017  ? REVERSE SHOULDER ARTHROPLASTY Left 02/13/2021  ? Procedure: REVERSE SHOULDER ARTHROPLASTY;  Surgeon: LMarchia Bond MD;  [facility];  Service: Orthopedics;  Laterality: Left;  ? RIGHT/LEFT HEART CATH AND CORONARY ANGIOGRAPHY N/A 08/02/2016  ? Procedure: Right/Left Heart Cath and Coronary Angiography;  Surgeon: MSherren Mocha MD;  [facility];  Service: Cardiovascular;  Laterality: N/A;  ? ROTATOR CUFF REPAIR Left   ? TEE WITHOUT CARDIOVERSION N/A 08/23/2016  ? Procedure: TRANSESOPHAGEAL ECHOCARDIOGRAM (TEE);  Surgeon: CRexene Alberts  MD;  [facility];  Service: Open Heart Surgery;  Laterality: N/A;  ? TOTAL KNEE ARTHROPLASTY  04/15/2012  ? Procedure: TOTAL KNEE ARTHROPLASTY;  Surgeon: [name], MD;  Location: [facility];  Service: Orthopedics;  Laterality: Right;  ? TRIGGER FINGER RELEASE Right 04/20/2015  ? Procedure: RIGHT TRIGGER FINGER RELEASE (TENDON SHEALTH INCISION) ;  Surgeon: [name], MD;  Location: [facility];  Service: Orthopedics;  Laterality: Right;  ? ? ?There were no vitals filed for this visit. ? ? Subjective Assessment - 09/11/21 1443   ? ? Subjective I am doing okay, still pain down the leg   ? Currently in Pain? Yes   ? Pain Score 2    ? Pain Location Leg   ? Pain Orientation Left   ? Pain Descriptors / Indicators  Sore;Aching;Tender   ? ?  ?  ? ?  ? ? ? ? ? ? ? ? ? ? ? ? ? ? ? ? ? ? ? ? [facility] Adult PT Treatment/Exercise - 09/11/21 0001   ? ?  ? Lumbar Exercises: Aerobic  ? Nustep level 5 x 6 minutes   ?  ? Lumbar Exercises: Machines for Strengthening  ? Cybex Knee Extension 5# 2x10   ? Cybex Knee Flexion 15# 2x10   ? Other Lumbar Machine Exercise rows 15#, lats 15#, chest press 5#   ?  ? Lumbar Exercises: Supine  ? Bridge Compliant;20 reps   ? Other Supine Lumbar Exercises feet on ball K2C, trunk rotation, small bridges , tried isometric aabs   ?  ? Manual Therapy  ? Soft tissue mobilization left buttock and left ITB with vibration   ? ?  ?  ? ?  ? ? ? ? ? ? ? ? ? ? ? ? PT Short Term Goals - 09/06/21 1555   ? ?  ? PT SHORT TERM GOAL #1  ? Title independent with intial HEP   ? Status Achieved   ? ?  ?  ? ?  ? ? ? ? PT Long Term Goals - 09/11/21 1617   ? ?  ? PT LONG TERM GOAL #2  ? Title returns safely to the gym   ? Status Partially Met   ? ?  ?  ? ?  ? ? ? ? ? ? ? ? Plan - 09/11/21 1615   ? ? Clinical Impression Statement Patient reports feeling pretty good, reports some left buttock and left lateral thigh pain and tenderness, we continued to work on strength UE and LE, I ddi add some STM to the left buttock and the ITB area as she was very sore and tender.   ? PT Next Visit Plan add some lumbar exercises and continue with shoulder   ? Consulted and Agree with Plan of Care Patient   ? ?  ?  ? ?  ? ? ?Patient will benefit from skilled therapeutic intervention in order to improve the following deficits and impairments:  Decreased range of motion, Impaired UE functional use, Increased muscle spasms, Pain, Improper body mechanics, Postural dysfunction, Increased edema, Decreased strength, Decreased mobility ? ?Visit Diagnosis: ?Acute bilateral low back pain with bilateral sciatica ? ?Localized edema ? ?S/P reverse total shoulder arthroplasty, left ? ?Stiffness of left shoulder, not elsewhere classified ? ? ? ? ?Problem  List ?Patient Active Problem List  ? Diagnosis Date Noted  ? S/P reverse total shoulder arthroplasty, left 02/13/2021  ? Idiopathic hyperphosphatasia 08/03/2019  ?  Vitamin D deficiency 06/04/2018  ? Hyponatremia 02/17/2018  ? History of CVA in adulthood 08/13/2017  ? Blurry vision 08/13/2017  ? Hypertension associated with diabetes ([place]) 01/02/2017  ? S/P minimally invasive aortic valve replacement with bioprosthetic valve 08/23/2016  ? Diastolic dysfunction   ? Osteopenia 05/27/2016  ? OSA (obstructive sleep apnea) 02/27/2016  ? Class 1 obesity with serious comorbidity and body mass index (BMI) of 32.0 to 32.9 in adult 10/23/2015  ? Bruxism 10/23/2015  ? Elevated alkaline phosphatase level 05/16/2015  ? Atypical chest pain 11/10/2014  ? Acne 05/12/2014  ? Diabetes mellitus ([place]) 05/12/2014  ? GERD (gastroesophageal reflux disease) 03/10/2014  ? Generalized anxiety disorder 03/10/2014  ? Migraines 03/10/2014  ? EUSTACHIAN TUBE DYSFUNCTION, CHRONIC 06/14/2010  ? Irritable bowel syndrome 12/15/2008  ? Low back pain 08/11/2008  ? Hyperlipidemia associated with type 2 diabetes mellitus ([place]) 02/09/2008  ? Allergic rhinitis 02/12/2007  ? Asthma 02/12/2007  ? ? Sumner Boast, PT ?09/11/2021, 4:17 PM ? ?[place] ?[address] ?[place], [place], [address] ?Phone: [phone]   Fax:  [phone] ? ?Name: Tatum Corl [place] ?MRN: [id number] ?Date of Birth: Sep 18, 1950 ? ? ? ?

## 2021-09-12 NOTE — Progress Notes (Signed)
?Cardiology Office Note:   ? ?Date:  09/13/2021  ? ?ID:  [name], DOB [birth year], MRN [id number] ? ?PCP:  [name], MD ?  ?[facility] Providers ?Cardiologist:  Freada Bergeron, MD { ? ? ?Referring MD: [name], MD  ? ? ?History of Present Illness:   ? ?[name] is a 71 y.o. female with a hx of DMII, HLD, OSA on CPAP, and severe AS s/p bioprosthetic AVR in 08/2016 who was previously followed by Dr. Meda Coffee who now returns to clinic for follow-up. ? ?Per review of the record, the patient has a history of aortic valve replacement with bioprosthetic bovine pericardial tissue valve 08/23/16. Pre-op cath with only minor nonobstructive CAD. Also right heart cath showed normal filling pressures. Echocardiogram showed normal EF of 60-65% and grade 1 diastolic dysfunction.  The patient was very motivated post surgery, underwent complete rehab program and dietary program and has lost 30 pounds.  ? ?Has history of occipital stroke in 2019. Cardiac monitor negative for Afib. Has been maintained on plavix. ? ?Last saw Dr. Meda Coffee in 08/2020 where she was doing great with no HF or anginal symptoms. ? ?Today, she is accompanied by her husband. She has been doing very well. Remains active with no exertional symptoms. She denies chest pain, chest pressure, dyspnea at rest or with exertion, palpitations, PND, orthopnea, or leg swelling. Compliant with all medications. BP well controlled at home. ? ?She has not received a new CPAP machine because of her fall and shoulder injury. She is about to complete physical therapy for a reverse shoulder replacement. She goes to [facility] and hopes to lose 20 lbs.  ? ?Past Medical History:  ?Diagnosis Date  ? Allergy   ? Anxiety   ? Arthritis   ? back & knee  ? Asthma   ?  mild per pt shows up with resp illness  ? Colon polyps   ? Constipation   ? Diabetes mellitus without complication ([place])   ? Gallstones   ? Gastric polyps   ?  Gastroparesis   ? GERD (gastroesophageal reflux disease)   ? Headache   ? sinus headaches and migraines at times  ? Heart murmur   ? History of migraine headaches   ? HTN (hypertension)   ? Hyperlipidemia   ? IBS (irritable bowel syndrome)   ? Joint pain   ? Lumbar disc disease   ? PONV (postoperative nausea and vomiting)   ? S/P aortic valve replacement with bioprosthetic valve 08/23/2016  ? 25 mm Edwards Intuity rapid-deployment bovine pericardial tissue valve via partial upper mini sternotomy  ? Sleep apnea   ? CPAP  ? TIA (transient ischemic attack)   ? TIA (transient ischemic attack)   ? hx of per pt  ? ? ?Past Surgical History:  ?Procedure Laterality Date  ? ABDOMINAL HYSTERECTOMY    ? AORTIC VALVE REPLACEMENT N/A 08/23/2016  ? Procedure: AORTIC VALVE REPLACEMENT (AVR) - using partial Upper Sternotomy- 69m Edwards Intuity Aortic Valve used;  Surgeon: CRexene Alberts MD;  [facility]  Service: Open Heart Surgery;  Laterality: N/A;  ? BLADDER SUSPENSION  2007  ? [place] ? CARPAL TUNNEL RELEASE Bilateral   ? COLONOSCOPY    ? DORSAL COMPARTMENT RELEASE Right 09/15/2014  ? Procedure: RIGHT WRIST DEQUERVAINS RELEASE ;  Surgeon: DKathryne Hitch MD;  [place] [facility]  Service: Orthopedics;  Laterality: Right;  ? ESOPHAGOGASTRODUODENOSCOPY    ?  EYE SURGERY    ? cataract surgery  ? KNEE ARTHROSCOPY  04/15/2012  ? Procedure: ARTHROSCOPY KNEE;  Surgeon: [name], MD;  Location: [facility] [place];  Service: Orthopedics;  Laterality: Right;  ? LAPAROSCOPIC CHOLECYSTECTOMY    ? LASIK    ? OOPHORECTOMY    ? PALATE TO GINGIVA GRAFT  2017  ? REVERSE SHOULDER ARTHROPLASTY Left 02/13/2021  ? Procedure: REVERSE SHOULDER ARTHROPLASTY;  Surgeon: Marchia Bond, MD;  [facility];  Service: Orthopedics;  Laterality: Left;  ? RIGHT/LEFT HEART CATH AND CORONARY ANGIOGRAPHY N/A 08/02/2016  ? Procedure: Right/Left Heart Cath and Coronary Angiography;  Surgeon: Sherren Mocha, MD;  Location: [facility];  Service: Cardiovascular;  Laterality: N/A;  ? ROTATOR CUFF REPAIR Left   ? TEE WITHOUT CARDIOVERSION N/A 08/23/2016  ? Procedure: TRANSESOPHAGEAL ECHOCARDIOGRAM (TEE);  Surgeon: Rexene Alberts, MD;  Location: [facility];  Service: Open Heart Surgery;  Laterality: N/A;  ? TOTAL KNEE ARTHROPLASTY  04/15/2012  ? Procedure: TOTAL KNEE ARTHROPLASTY;  Surgeon: [name], MD;  Location: [facility];  Service: Orthopedics;  Laterality: Right;  ? TRIGGER FINGER RELEASE Right 04/20/2015  ? Procedure: RIGHT TRIGGER FINGER RELEASE (TENDON SHEALTH INCISION) ;  Surgeon: [name], MD;  Location: [facility];  Service: Orthopedics;  Laterality: Right;  ? ? ?Current Medications: ?Current Meds  ?Medication Sig  ? ACCU-CHEK GUIDE test strip USE TO TEST BLOOD SUGARS DAILY. DX: E11.9  ? Accu-Chek Softclix Lancets lancets USE AS INSTRUCTED  ? amLODipine (NORVASC) 10 MG tablet Take 1 tablet (10 mg total) by mouth daily.  ? atorvastatin (LIPITOR) 40 MG tablet TAKE 1 TABLET BY MOUTH DAILY AT 6 PM.  ? Biotin 5000 MCG TABS Take 5,000 mcg by mouth daily.   ? blood glucose meter kit and supplies KIT Dispense based on patient and insurance preference. Use up to four times daily as directed. Dx: E11.9  ? calcium-vitamin D (OSCAL WITH D) 500-200 MG-UNIT per tablet Take 2 tablets by mouth daily.   ? Cholecalciferol (VITAMIN D) 125 MCG (5000 UT) CAPS Take 5,000 Units by mouth in the morning.  ? cloNIDine (CATAPRES) 0.1 MG tablet Take 1 tablet (0.1 mg total) by mouth at bedtime.  ? clopidogrel (PLAVIX) 75 MG tablet TAKE 1 TABLET BY MOUTH EVERY DAY  ? demeclocycline (DECLOMYCIN) 300 MG tablet TAKE 1 TABLET (300 MG TOTAL) BY MOUTH 2 (TWO) TIMES DAILY.  ? fluticasone (FLONASE) 50 MCG/ACT nasal spray Place 2 sprays into both nostrils in the morning.  ? ipratropium (ATROVENT) 0.03 % nasal spray 2 SPRAYS IN EACH NOSTRIL AS NEEDED THREE TIMES AS NEEDED  ? loratadine (CLARITIN) 10 MG tablet Take 10 mg by mouth at bedtime.   ?  losartan (COZAAR) 100 MG tablet Take 100 mg by mouth daily. Pt takes in the pm per pt  ? Melatonin 5 MG TABS Take 5 mg by mouth at bedtime as needed (sleep).  ? metFORMIN (GLUCOPHAGE) 500 MG tablet Take 1 tablet (500 mg total) by mouth daily with breakfast.  ? metoCLOPramide (REGLAN) 10 MG tablet TAKE 1/2 TABLET BY MOUTH 4 TIMES A DAY  ? Multiple Vitamin (MULTIVITAMIN WITH MINERALS) TABS tablet Take 1 tablet by mouth in the morning.  ? omeprazole (PRILOSEC) 40 MG capsule Take 40 mg by mouth daily.  ? Probiotic Product (ALIGN) 4 MG CAPS Take 4 mg by mouth daily.   ? psyllium (METAMUCIL SMOOTH TEXTURE) 28 % packet Take 1 packet by mouth 2 (two)  times daily.  ? sodium chloride 1 g tablet Take 3 g by mouth 2 (two) times daily with a meal.  ? venlafaxine XR (EFFEXOR-XR) 75 MG 24 hr capsule TAKE 1 CAPSULE BY MOUTH DAILY WITH BREAKFAST.  ?  ? ?Allergies:   Augmentin [amoxicillin-pot clavulanate], Erythromycin, and Penicillins  ? ?Social History  ? ?Socioeconomic History  ? Marital status: Married  ?  Spouse name: [name]  ? Number of children: 1  ? Years of education: Not on file  ? Highest education level: Not on file  ?Occupational History  ? Occupation: Retired, Advice worker  ?  Employer: RETIRED  ?Tobacco Use  ? Smoking status: Never  ? Smokeless tobacco: Never  ?Vaping Use  ? Vaping Use: Never used  ?Substance and Sexual Activity  ? Alcohol use: No  ? Drug use: No  ? Sexual activity: Yes  ?Other Topics Concern  ? Not on file  ?Social History Narrative  ? She lives with husband (1978) and two dogs. Step-son [name] (1971-psychiatrist in [place]). No grandkids. 2 dogs-sheltie/collie mix and border collie mix  ?   ? Highest level of education:  Master in education  ? She is retired Animal nutritionist x 32 years.  ?   ? Hobbies: time with dogs  ? Right handed  ? One story home  ?   ?   ? ?Social Determinants of Health  ? ?Financial Resource Strain: Not on file  ?Food Insecurity: Not on file  ?Transportation Needs:  Not on file  ?Physical Activity: Not on file  ?Stress: Not on file  ?Social Connections: Not on file  ?  ? ?Family History: ?The patient's family history includes Alcohol abuse in her father; Anxiety disorde

## 2021-09-13 ENCOUNTER — Other Ambulatory Visit: Payer: Self-pay

## 2021-09-13 ENCOUNTER — Ambulatory Visit: Payer: Medicare PPO | Admitting: Cardiology

## 2021-09-13 ENCOUNTER — Encounter: Payer: Self-pay | Admitting: Cardiology

## 2021-09-13 ENCOUNTER — Ambulatory Visit: Payer: Medicare PPO | Admitting: Physical Therapy

## 2021-09-13 VITALS — BP 134/62 | HR 65 | Ht 66.0 in | Wt 199.0 lb

## 2021-09-13 DIAGNOSIS — Z953 Presence of xenogenic heart valve: Secondary | ICD-10-CM | POA: Diagnosis not present

## 2021-09-13 DIAGNOSIS — M25512 Pain in left shoulder: Secondary | ICD-10-CM | POA: Diagnosis not present

## 2021-09-13 DIAGNOSIS — Z96612 Presence of left artificial shoulder joint: Secondary | ICD-10-CM | POA: Diagnosis not present

## 2021-09-13 DIAGNOSIS — R600 Localized edema: Secondary | ICD-10-CM | POA: Diagnosis not present

## 2021-09-13 DIAGNOSIS — M25612 Stiffness of left shoulder, not elsewhere classified: Secondary | ICD-10-CM

## 2021-09-13 DIAGNOSIS — M5442 Lumbago with sciatica, left side: Secondary | ICD-10-CM | POA: Diagnosis not present

## 2021-09-13 DIAGNOSIS — E782 Mixed hyperlipidemia: Secondary | ICD-10-CM | POA: Diagnosis not present

## 2021-09-13 DIAGNOSIS — I5032 Chronic diastolic (congestive) heart failure: Secondary | ICD-10-CM

## 2021-09-13 DIAGNOSIS — I10 Essential (primary) hypertension: Secondary | ICD-10-CM | POA: Diagnosis not present

## 2021-09-13 DIAGNOSIS — G4733 Obstructive sleep apnea (adult) (pediatric): Secondary | ICD-10-CM

## 2021-09-13 DIAGNOSIS — T8203XA Leakage of heart valve prosthesis, initial encounter: Secondary | ICD-10-CM

## 2021-09-13 DIAGNOSIS — M5441 Lumbago with sciatica, right side: Secondary | ICD-10-CM

## 2021-09-13 DIAGNOSIS — T8203XD Leakage of heart valve prosthesis, subsequent encounter: Secondary | ICD-10-CM | POA: Diagnosis not present

## 2021-09-13 DIAGNOSIS — Z8673 Personal history of transient ischemic attack (TIA), and cerebral infarction without residual deficits: Secondary | ICD-10-CM

## 2021-09-13 NOTE — Therapy (Signed)
[facility] ?[address] ?[address] ?[place], [place], [address] ?Phone: [phone]   Fax:  [phone] ? ?Physical Therapy Treatment ? ?Patient Details  ?Name: [name] ?MRN: [id number] ?Date of Birth: 05/06/1951 ?Referring Provider (PT): Mardelle Matte ? ? ?Encounter Date: 09/13/2021 ? ? PT End of Session - 09/13/21 1553   ? ? Visit Number 36   ? Date for PT Re-Evaluation 09/21/21   ? Authorization Type Humana   ? PT Start Time 1325   ? PT Stop Time 1405   ? PT Time Calculation (min) 40 min   ? ?  ?  ? ?  ? ? ?Past Medical History:  ?Diagnosis Date  ? Allergy   ? Anxiety   ? Arthritis   ? back & knee  ? Asthma   ?  mild per pt shows up with resp illness  ? Colon polyps   ? Constipation   ? Diabetes mellitus without complication ([place])   ? Gallstones   ? Gastric polyps   ? Gastroparesis   ? GERD (gastroesophageal reflux disease)   ? Headache   ? sinus headaches and migraines at times  ? Heart murmur   ? History of migraine headaches   ? HTN (hypertension)   ? Hyperlipidemia   ? IBS (irritable bowel syndrome)   ? Joint pain   ? Lumbar disc disease   ? PONV (postoperative nausea and vomiting)   ? S/P aortic valve replacement with bioprosthetic valve 08/23/2016  ? 25 mm Edwards Intuity rapid-deployment bovine pericardial tissue valve via partial upper mini sternotomy  ? Sleep apnea   ? CPAP  ? TIA (transient ischemic attack)   ? TIA (transient ischemic attack)   ? hx of per pt  ? ? ?Past Surgical History:  ?Procedure Laterality Date  ? ABDOMINAL HYSTERECTOMY    ? AORTIC VALVE REPLACEMENT N/A 08/23/2016  ? Procedure: AORTIC VALVE REPLACEMENT (AVR) - using partial Upper Sternotomy- 39m Edwards Intuity Aortic Valve used;  Surgeon: CRexene Alberts MD;  [facility]  Service: Open Heart Surgery;  Laterality: N/A;  ? BLADDER SUSPENSION  2007  ? [place] ? CARPAL TUNNEL RELEASE Bilateral   ? COLONOSCOPY    ? DORSAL COMPARTMENT RELEASE Right 09/15/2014  ?  Procedure: RIGHT WRIST DEQUERVAINS RELEASE ;  Surgeon: DKathryne Hitch MD;  [facility]  Service: Orthopedics;  Laterality: Right;  ? ESOPHAGOGASTRODUODENOSCOPY    ? EYE SURGERY    ? cataract surgery  ? KNEE ARTHROSCOPY  04/15/2012  ? Procedure: ARTHROSCOPY KNEE;  Surgeon: DNinetta Lights MD;  [facility]  Service: Orthopedics;  Laterality: Right;  ? LAPAROSCOPIC CHOLECYSTECTOMY    ? LASIK    ? OOPHORECTOMY    ? PALATE TO GINGIVA GRAFT  2017  ? REVERSE SHOULDER ARTHROPLASTY Left 02/13/2021  ? Procedure: REVERSE SHOULDER ARTHROPLASTY;  Surgeon: LMarchia Bond MD;  [facility];  Service: Orthopedics;  Laterality: Left;  ? RIGHT/LEFT HEART CATH AND CORONARY ANGIOGRAPHY N/A 08/02/2016  ? Procedure: Right/Left Heart Cath and Coronary Angiography;  Surgeon: MSherren Mocha MD;  [facility];  Service: Cardiovascular;  Laterality: N/A;  ? ROTATOR CUFF REPAIR Left   ? TEE WITHOUT CARDIOVERSION N/A 08/23/2016  ? Procedure: TRANSESOPHAGEAL ECHOCARDIOGRAM (TEE);  Surgeon: CRexene Alberts MD;  [facility]  Service: Open Heart Surgery;  Laterality: N/A;  ? TOTAL KNEE ARTHROPLASTY  04/15/2012  ? Procedure: TOTAL KNEE ARTHROPLASTY;  Surgeon: [name], MD;  Location: [facility];  Service: Orthopedics;  Laterality: Right;  ? TRIGGER FINGER RELEASE Right 04/20/2015  ? Procedure: RIGHT TRIGGER FINGER RELEASE (TENDON SHEALTH INCISION) ;  Surgeon: [name], MD;  Location: [facility];  Service: Orthopedics;  Laterality: Right;  ? ? ?There were no vitals filed for this visit. ? ? Subjective Assessment - 09/13/21 1524   ? ? Subjective pain running down leg and wakes me up L>RT. shld is good   ? Currently in Pain? Yes   ? Pain Score 2    ? Pain Location Leg   ? ?  ?  ? ?  ? ? ? ? ? ? ? ? ? ? ? ? ? ? ? ? ? ? ? ? [facility] Adult PT Treatment/Exercise - 09/13/21 0001   ? ?  ? Lumbar Exercises: Aerobic  ? Nustep level 5 x 6 minutes   ?  ? Lumbar Exercises: Machines for  Strengthening  ? Cybex Lumbar Extension 2 sets 10 black tband ( flex and ext )   ? Other Lumbar Machine Exercise rows and lats 20# 2 sets 10   ?  ? Lumbar Exercises: Supine  ? Bridge Non-compliant;20 reps;3 seconds   feet on ball plus KTC and obl  ? Bridge with clamshell Compliant;15 reps   tband NO bridge  ? Bridge with March 20 reps;Compliant   tband NO bridge  ? Bridge with [employer] Limitations 15x   ? Other Supine Lumbar Exercises iso abs 3 sec 15x   ?  ? Manual Therapy  ? Soft tissue mobilization left buttock and left ITB   ? Passive ROM PROM with end range stretch   LE  ? ?  ?  ? ?  ? ? ? ? ? ? ? ? ? ? ? ? PT Short Term Goals - 09/06/21 1555   ? ?  ? PT SHORT TERM GOAL #1  ? Title independent with intial HEP   ? Status Achieved   ? ?  ?  ? ?  ? ? ? ? PT Long Term Goals - 09/13/21 1551   ? ?  ? PT LONG TERM GOAL #1  ? Title decrease pain 50%   ? Status Achieved   ?  ? PT LONG TERM GOAL #2  ? Title returns safely to the gym   ? Baseline discussed plan for return   ? Status Partially Met   ?  ? PT LONG TERM GOAL #3  ? Title increase AROM of the left shoulder to 120 degrees flexion in standing   ? Status Achieved   ?  ? PT LONG TERM GOAL #4  ? Title report no difficulty dressing or doing hair   ? Status Achieved   ?  ? PT LONG TERM GOAL #5  ? Title decrease lumbar pain and sciatic symptoms 50%   ? Status Partially Met   ? ?  ?  ? ?  ? ? ? ? ? ? ? ? Plan - 09/13/21 1553   ? ? Clinical Impression Statement discussed return to gym safely for back and shld. progressing with goals. increased wt/resistance. look to D/C next visit.still some radiating issues   ? PT Treatment/Interventions ADLs/Self Care Home Management;Cryotherapy;Electrical Stimulation;Therapeutic activities;Therapeutic exercise;Neuromuscular re-education;Manual techniques;Patient/family education;Cognitive remediation;Vasopneumatic Device   ? PT Next Visit Plan plan to D/C   ? ?  ?  ? ?  ? ? ?Patient will benefit from skilled therapeutic  intervention in order to improve the following deficits and impairments:  Decreased range of motion, Impaired UE functional use, Increased muscle spasms, Pain, Improper body mechanics, Postural dysfunction, Increased edema, Decreased strength, Decreased mobility ? ?Visit Diagnosis: ?Acute bilateral low back pain with bilateral sciatica ? ?S/P reverse total shoulder arthroplasty, left ? ?Stiffness of left shoulder, not elsewhere classified ? ? ? ? ?Problem List ?Patient Active Problem List  ? Diagnosis Date Noted  ? S/P reverse total shoulder arthroplasty, left 02/13/2021  ? Idiopathic hyperphosphatasia 08/03/2019  ? Vitamin D deficiency 06/04/2018  ? Hyponatremia 02/17/2018  ? History of CVA in adulthood 08/13/2017  ? Blurry vision 08/13/2017  ? Hypertension associated with diabetes ([place]) 01/02/2017  ? S/P minimally invasive aortic valve replacement with bioprosthetic valve 08/23/2016  ? Diastolic dysfunction   ? Osteopenia 05/27/2016  ? OSA (obstructive sleep apnea) 02/27/2016  ? Class 1 obesity with serious comorbidity and body mass index (BMI) of 32.0 to 32.9 in adult 10/23/2015  ? Bruxism 10/23/2015  ? Elevated alkaline phosphatase level 05/16/2015  ? Atypical chest pain 11/10/2014  ? Acne 05/12/2014  ? Diabetes mellitus ([place]) 05/12/2014  ? GERD (gastroesophageal reflux disease) 03/10/2014  ? Generalized anxiety disorder 03/10/2014  ? Migraines 03/10/2014  ? EUSTACHIAN TUBE DYSFUNCTION, CHRONIC 06/14/2010  ? Irritable bowel syndrome 12/15/2008  ? Low back pain 08/11/2008  ? Hyperlipidemia associated with type 2 diabetes mellitus ([place]) 02/09/2008  ? Allergic rhinitis 02/12/2007  ? Asthma 02/12/2007  ? ? ?Nikholas Geffre,ANGIE, PTA ?09/13/2021, 3:55 PM ? ?[facility] ?[address] ?[address] ?[place], [place], [address] ?Phone: [phone]   Fax:  [phone] ? ?Name: Dublin Cantero Halpin ?MRN: [id number] ?Date of Birth: [birth year] ? ? ? ?

## 2021-09-13 NOTE — Progress Notes (Signed)
? ? ? ?Chief Complaint:  ? ?OBESITY ?Shaely is here to discuss her progress with her obesity treatment plan along with follow-up of her obesity related diagnoses. Reanna is on the Category 3 Plan and keeping a food journal and adhering to recommended goals of 1300-1400 calories and 100 grams of protein and states she is following her eating plan approximately 85% of the time. Hamna states she is doing physical therapy for 45 minutes 2 times per week. ? ?Today's visit was #: 64 ?Starting weight: 220 lbs ?Starting date: 01/19/2018 ?Today's weight: 192 lbs ?Today's date: 09/11/2021 ?Total lbs lost to date: 28 lbs ?Total lbs lost since last in-office visit: 1 lb ? ?Interim History: Tatiana has not been journaling consistently and thinks that she has been over snacking. She is in physical therapy for her back. She has no plans to travel the next few weeks.  ? ?Subjective:  ? ?1. Type 2 diabetes mellitus with other specified complication, without long-term current use of insulin ([place]) ?Velora's fasting blood sugar was in the range of 119-148. Her last A1C was 6.6. She is on Metformin and tolerating it well.  ? ?Assessment/Plan:  ? ?1. Type 2 diabetes mellitus with other specified complication, without long-term current use of insulin ([place]) ?Gayna will continue Metformin and the plan. Good blood sugar control is important to decrease the likelihood of diabetic complications such as nephropathy, neuropathy, limb loss, blindness, coronary artery disease, and death. Intensive lifestyle modification including diet, exercise and weight loss are the first line of treatment for diabetes.  ? ?2. Obesity with current BMI of 31.0 ?Kendyl is currently in the action stage of change. As such, her goal is to continue with weight loss efforts. She has agreed to the Category 3 Plan and keeping a food journal and adhering to recommended goals of 450-600 calories and 40 grams of protein at supper.  ? ?Exercise  goals:  As is. ? ?Behavioral modification strategies: meal planning and cooking strategies and planning for success. ? ?Maleka has agreed to follow-up with our clinic in 3 weeks. She was informed of the importance of frequent follow-up visits to maximize her success with intensive lifestyle modifications for her multiple health conditions.  ? ?Objective:  ? ?Blood pressure 132/66, pulse 67, temperature 98.9 ?F (37.2 ?C), height '5\' 6"'$  (1.676 m), weight 192 lb (87.1 kg), SpO2 98 %. ?Body mass index is 30.99 kg/m?. ? ?General: Cooperative, alert, well developed, in no acute distress. ?HEENT: Conjunctivae and lids unremarkable. ?Cardiovascular: Regular rhythm.  ?Lungs: Normal work of breathing. ?Neurologic: No focal deficits.  ? ?Lab Results  ?Component Value Date  ? CREATININE 0.63 06/05/2021  ? BUN 16 06/05/2021  ? NA 135 06/05/2021  ? K 5.2 06/05/2021  ? CL 97 06/05/2021  ? CO2 22 06/05/2021  ? ?Lab Results  ?Component Value Date  ? ALT 15 06/05/2021  ? AST 27 06/05/2021  ? ALKPHOS 251 (H) 06/05/2021  ? BILITOT 0.4 06/05/2021  ? ?Lab Results  ?Component Value Date  ? HGBA1C 6.6 (H) 06/05/2021  ? HGBA1C 6.5 (H) 02/09/2021  ? HGBA1C 6.3 (H) 01/25/2021  ? HGBA1C 5.7 (H) 04/20/2020  ? HGBA1C 5.9 (A) 12/09/2019  ? ?Lab Results  ?Component Value Date  ? INSULIN 12.9 06/05/2021  ? INSULIN 7.7 01/25/2021  ? INSULIN 13.1 04/20/2020  ? INSULIN 12.1 01/19/2018  ? ?Lab Results  ?Component Value Date  ? TSH 3.38 06/04/2018  ? ?Lab Results  ?Component Value Date  ? CHOL 144 06/05/2021  ?  HDL 64 06/05/2021  ? Shelley 65 06/05/2021  ? LDLDIRECT 58.0 11/25/2017  ? TRIG 75 06/05/2021  ? CHOLHDL 2.3 06/05/2021  ? ?Lab Results  ?Component Value Date  ? VD25OH 70.1 06/05/2021  ? VD25OH 74.5 01/25/2021  ? VD25OH 66.5 04/20/2020  ? ?Lab Results  ?Component Value Date  ? WBC 11.5 (H) 02/09/2021  ? HGB 12.9 02/09/2021  ? HCT 41.1 02/09/2021  ? MCV 87.1 02/09/2021  ? PLT 401 (H) 02/09/2021  ? ?No results found for: IRON, TIBC,  FERRITIN ? ?Obesity Behavioral Intervention:  ? ?Approximately 15 minutes were spent on the discussion below. ? ?ASK: ?We discussed the diagnosis of obesity with [name] today and Mckensey agreed to give [name] permission to discuss obesity behavioral modification therapy today. ? ?ASSESS: ?Natelie has the diagnosis of obesity and her BMI today is 31.0. Ranisha is in the action stage of change.  ? ?ADVISE: ?Willow was educated on the multiple health risks of obesity as well as the benefit of weight loss to improve her health. She was advised of the need for long term treatment and the importance of lifestyle modifications to improve her current health and to decrease her risk of future health problems. ? ?AGREE: ?Multiple dietary modification options and treatment options were discussed and Chenelle agreed to follow the recommendations documented in the above note. ? ?ARRANGE: ?Pamella was educated on the importance of frequent visits to treat obesity as outlined per CMS and USPSTF guidelines and agreed to schedule her next follow up appointment today. ? ?Attestation Statements:  ? ?Reviewed by clinician on day of visit: allergies, medications, problem list, medical history, surgical history, family history, social history, and previous encounter notes. ? ?I, Tonye Pearson, am acting as Location manager for [employer], PA-C. ? ?I have reviewed the above documentation for accuracy and completeness, and I agree with the above. [name], PA-C ? ?

## 2021-09-13 NOTE — Patient Instructions (Addendum)
Medication Instructions:  ? ?Your physician recommends that you continue on your current medications as directed. Please refer to the Current Medication list given to you today. ? ?*If you need a refill on your cardiac medications before your next appointment, please call your pharmacy* ? ? ?Testing/Procedures: ? ?Your physician has requested that you have an echocardiogram. Echocardiography is a painless test that uses sound waves to create images of your heart. It provides your doctor with information about the size and shape of your heart and how well your heart?s chambers and valves are working. This procedure takes approximately one hour. There are no restrictions for this procedure.  SCHEDULE TO BE DONE THE FIRST WEEK IN MARCH 2024 PER DR. Johney Frame ? ? ? ?Follow-Up: ?At [facility], you and your health needs are our priority.  As part of our continuing mission to provide you with exceptional heart care, we have created designated Provider Care Teams.  These Care Teams include your primary Cardiologist (physician) and Advanced Practice Providers (APPs -  Physician Assistants and Nurse Practitioners) who all work together to provide you with the care you need, when you need it. ? ?We recommend signing up for the patient portal called "MyChart".  Sign up information is provided on this After Visit Summary.  MyChart is used to connect with patients for Virtual Visits (Telemedicine).  Patients are able to view lab/test results, encounter notes, upcoming appointments, etc.  Non-urgent messages can be sent to your provider as well.   ?To learn more about what you can do with MyChart, go to [email or web address].   ? ?Your next appointment:   ?6 MONTHS ? ?The format for your next appointment:   ?In Person ? ?Provider:   ?Freada Bergeron, MD { ? ? ? ?

## 2021-09-17 ENCOUNTER — Other Ambulatory Visit: Payer: Self-pay

## 2021-09-17 ENCOUNTER — Encounter: Payer: Self-pay | Admitting: Endocrinology

## 2021-09-17 ENCOUNTER — Ambulatory Visit: Payer: Medicare PPO | Admitting: Endocrinology

## 2021-09-17 VITALS — BP 132/70 | HR 63 | Ht 66.0 in | Wt 201.0 lb

## 2021-09-17 DIAGNOSIS — R748 Abnormal levels of other serum enzymes: Secondary | ICD-10-CM

## 2021-09-17 DIAGNOSIS — E871 Hypo-osmolality and hyponatremia: Secondary | ICD-10-CM | POA: Diagnosis not present

## 2021-09-17 MED ORDER — DEMECLOCYCLINE HCL 300 MG PO TABS: 300.0000 mg | ORAL_TABLET | Freq: Two times a day (BID) | ORAL | 3 refills | Status: DC

## 2021-09-17 NOTE — Patient Instructions (Addendum)
Your blood pressure is high today.  Please see your primary care provider soon, to have it rechecked.   ?We'll continue to follow the alkaline phosphatase.   ?Blood tests are requested for you today.  We'll let you know about the results.   ?Please come back for a follow-up appointment in 6 months.   ?

## 2021-09-17 NOTE — Progress Notes (Addendum)
? ?Phone [phone] ?In person visit ?  ?Subjective:  ? ?[name] is a 71 y.o. year old very pleasant female patient who presents for/with See problem oriented charting ?Chief Complaint  ?Patient presents with  ? Hypertension  ? Diabetes  ? Follow-up  ? ? ?This visit occurred during the SARS-CoV-2 public health emergency.  Safety protocols were in place, including screening questions prior to the visit, additional usage of staff PPE, and extensive cleaning of exam room while observing appropriate contact time as indicated for disinfecting solutions.  ? ?Past Medical History-  ?Patient Active Problem List  ? Diagnosis Date Noted  ? History of CVA in adulthood 08/13/2017  ?  Priority: High  ? S/P minimally invasive aortic valve replacement with bioprosthetic valve 08/23/2016  ?  Priority: High  ? Diastolic dysfunction   ?  Priority: High  ? Diabetes mellitus ([place]) 05/12/2014  ?  Priority: High  ? Vitamin D deficiency 06/04/2018  ?  Priority: Medium   ? Hyponatremia 02/17/2018  ?  Priority: Medium   ? Primary hypertension 01/02/2017  ?  Priority: Medium   ? Osteopenia 05/27/2016  ?  Priority: Medium   ? OSA (obstructive sleep apnea) 02/27/2016  ?  Priority: Medium   ? Elevated alkaline phosphatase level 05/16/2015  ?  Priority: Medium   ? GERD (gastroesophageal reflux disease) 03/10/2014  ?  Priority: Medium   ? Generalized anxiety disorder 03/10/2014  ?  Priority: Medium   ? Migraines 03/10/2014  ?  Priority: Medium   ? Irritable bowel syndrome 12/15/2008  ?  Priority: Medium   ? Hyperlipidemia associated with type 2 diabetes mellitus ([place]) 02/09/2008  ?  Priority: Medium   ? Blurry vision 08/13/2017  ?  Priority: Low  ? Bruxism 10/23/2015  ?  Priority: Low  ? Atypical chest pain 11/10/2014  ?  Priority: Low  ? Acne 05/12/2014  ?  Priority: Low  ? EUSTACHIAN TUBE DYSFUNCTION, CHRONIC 06/14/2010  ?  Priority: Low  ? Low back pain 08/11/2008  ?  Priority: Low  ? Allergic rhinitis 02/12/2007  ?  Priority:  Low  ? Asthma 02/12/2007  ?  Priority: Low  ? S/P reverse total shoulder arthroplasty, left 02/13/2021  ? Idiopathic hyperphosphatasia 08/03/2019  ? Class 1 obesity with serious comorbidity and body mass index (BMI) of 32.0 to 32.9 in adult 10/23/2015  ? ? ?Medications- reviewed and updated ?Current Outpatient Medications  ?Medication Sig Dispense Refill  ? ACCU-CHEK GUIDE test strip USE TO TEST BLOOD SUGARS DAILY. DX: E11.9 100 strip 12  ? Accu-Chek Softclix Lancets lancets USE AS INSTRUCTED 100 each 12  ? amLODipine (NORVASC) 10 MG tablet Take 1 tablet (10 mg total) by mouth daily. 90 tablet 3  ? atorvastatin (LIPITOR) 40 MG tablet TAKE 1 TABLET BY MOUTH DAILY AT 6 PM. 90 tablet 3  ? Biotin 5000 MCG TABS Take 5,000 mcg by mouth daily.     ? blood glucose meter kit and supplies KIT Dispense based on patient and insurance preference. Use up to four times daily as directed. Dx: E11.9 1 each 0  ? calcium-vitamin D (OSCAL WITH D) 500-200 MG-UNIT per tablet Take 2 tablets by mouth daily.     ? Cholecalciferol (VITAMIN D) 125 MCG (5000 UT) CAPS Take 5,000 Units by mouth in the morning.    ? cloNIDine (CATAPRES) 0.1 MG tablet Take 1 tablet (0.1 mg total) by mouth at bedtime. 90 tablet 3  ? clopidogrel (PLAVIX) 75 MG tablet  TAKE 1 TABLET BY MOUTH EVERY DAY 90 tablet 3  ? demeclocycline (DECLOMYCIN) 300 MG tablet Take 1 tablet (300 mg total) by mouth 2 (two) times daily. 180 tablet 3  ? fluticasone (FLONASE) 50 MCG/ACT nasal spray Place 2 sprays into both nostrils in the morning.    ? ipratropium (ATROVENT) 0.03 % nasal spray 2 SPRAYS IN EACH NOSTRIL AS NEEDED THREE TIMES AS NEEDED    ? irbesartan (AVAPRO) 300 MG tablet Take 300 mg by mouth daily.    ? loratadine (CLARITIN) 10 MG tablet Take 10 mg by mouth at bedtime.     ? Melatonin 5 MG TABS Take 5 mg by mouth at bedtime as needed (sleep).    ? metFORMIN (GLUCOPHAGE) 500 MG tablet Take 1 tablet (500 mg total) by mouth daily with breakfast. 180 tablet 1  ? metoCLOPramide  (REGLAN) 10 MG tablet TAKE 1/2 TABLET BY MOUTH 4 TIMES A DAY 60 tablet 3  ? Multiple Vitamin (MULTIVITAMIN WITH MINERALS) TABS tablet Take 1 tablet by mouth in the morning.    ? omeprazole (PRILOSEC) 40 MG capsule Take 40 mg by mouth daily.    ? Probiotic Product (ALIGN) 4 MG CAPS Take 4 mg by mouth daily.     ? psyllium (METAMUCIL SMOOTH TEXTURE) 28 % packet Take 1 packet by mouth 2 (two) times daily.    ? sodium chloride 1 g tablet Take 3 g by mouth 2 (two) times daily with a meal.    ? venlafaxine XR (EFFEXOR-XR) 75 MG 24 hr capsule TAKE 1 CAPSULE BY MOUTH DAILY WITH BREAKFAST. 90 capsule 2  ? ?No current facility-administered medications for this visit.  ? ?  ?Objective:  ?BP 124/60   Pulse 70   Temp (!) 97.2 ?F (36.2 ?C)   Ht 5' 6" (1.676 m)   Wt 194 lb 6.4 oz (88.2 kg)   LMP  (LMP Unknown)   SpO2 99%   BMI 31.38 kg/m?  ?Gen: NAD, resting comfortably ?CV: RRR no murmurs rubs or gallops ?Lungs: CTAB no crackles, wheeze, rhonchi ?Abdomen: soft/nontender/nondistended/normal bowel sounds. No rebound or guarding.  ?Ext: no edema ?Skin: warm, dry, scar on left shoulder ? ?Diabetic Foot Exam - Simple   ?Simple Foot Form ?Diabetic Foot exam was performed with the following findings: Yes 09/24/2021  8:26 AM  ?Visual Inspection ?No deformities, no ulcerations, no other skin breakdown bilaterally: Yes ?Sensation Testing ?Intact to touch and monofilament testing bilaterally: Yes ?Pulse Check ?Posterior Tibialis and Dorsalis pulse intact bilaterally: Yes ?Comments ?  ? ?  ? ?Assessment and Plan  ? ?#other notes ?-recovering from left shoulder surgery Dr. Jake Seats wainer ? ?#s/p aortic valve replacement with bioprosthetic valve- required dental prophylaxis. Follows with Dr. Meda Coffee. Catheterization 08/02/16 with minor nonobstructive CAD.  Just saw Dr. Johney Frame 09/13/2021 and no evidence of heart failure or exertional symptoms noted  ? ?# OSA- previously on CPAP with Dr. Halford Chessman- states she has come off-she needs to  schedule follow-up. She is interested in inspire but needs retest after weight loss  ? ?#obesity- worked hard with weigh to wellness program.  Down 8 pounds from last in office measurement at this office  ? ?#alkaline phosphatase elevation and hyponatremia- working with Dr. Loanne Drilling but will be transferring to new endocrinologist as he is leaving practice. Continue q6 month follow up. Both issues stable- unclear next steps from labs ? ?#mild leukocytosis- recheck with labs today- if worsens consider hematology consult ?Lab Results  ?Component Value Date  ?  WBC 7.5 09/24/2021  ? HGB 12.0 09/24/2021  ? HCT 36.7 09/24/2021  ? MCV 83.4 09/24/2021  ? PLT 309.0 09/24/2021  ? ?# Diabetes ?S: compliant with metformin down to 500 AM only ?CBGs- 127 this AM ?Lab Results  ?Component Value Date  ? HGBA1C 6.3 09/24/2021  ? HGBA1C 6.6 (H) 06/05/2021  ? HGBA1C 6.5 (H) 02/09/2021  ? A/P:  hopefully stable- update A1c today. Continue current meds for now l ? ?#hypertension/diastolic dysfunction (prior listed as CHF but no chronic lasix need and no hospitalization for HF) ?S: medication: amlodipine 10 mg, now on clonidine 0.104m nightly to see if helps with sweating issues- some help ?-She is no longer on losartan-on irbesartan 300 mg ?In past losartan 25 mg but stopped to see if clonidine would help sweating issues. Prior on metoprolol 12.542mBID and lasix ?BP Readings from Last 3 Encounters:  ?09/24/21 124/60  ?09/17/21 132/70  ?09/13/21 134/62  ?A/P: Controlled. Continue current medications-amlodipine 10 mg, irbesartan 300 mg, clonidine 0.1 milligrams nightly primarily to see if this would help with sweating ? ?#hyperlipidemia/history of stroke ?S: for lipids continues to be compliant with atorvastatin 4027m ?Patient also compliant with stroke history- plavix 75 mg ?Lab Results  ?Component Value Date  ? CHOL 144 06/05/2021  ? HDL 64 06/05/2021  ? LDL[place] 06/05/2021  ? LDLDIRECT 58.0 11/25/2017  ? TRIG 75 06/05/2021  ? CHOLHDL  2.3 06/05/2021  ? A/P: LDL has been well controlled with goal under 70-continue current medication.  Continue Plavix for stroke prevention ? ?# generalized anxiety ?S: patient is compliant with venlafaxine 87m85me a

## 2021-09-17 NOTE — Progress Notes (Signed)
? ?Subjective:  ? ? Patient ID: [name], female    DOB: 01-13-1951, 71 y.o.   MRN: [id number] ? ?HPI ?Pt returns for f/u of hyponatremia (dx'ed 2013; only cause found was metaclopramide, which she cannot safely stop; she takes demeclocycline and NaCl; ACTH stim test, creat, BNP, and TFT were normal; MRI showed old CVA's; pt says IBS-C requires her to drink fluids).  pt states she feels well in general.  She takes meds as rx'ed.   ?Pt also has h/o elev AP (etiol is uncertain; isoenzymes suggested bone cause; bone scan showed multifocal degenerative changes. No evidence of acute or destructive process; plan is to follow).   ?Past Medical History:  ?Diagnosis Date  ? Allergy   ? Anxiety   ? Arthritis   ? back & knee  ? Asthma   ?  mild per pt shows up with resp illness  ? Colon polyps   ? Constipation   ? Diabetes mellitus without complication ([place])   ? Gallstones   ? Gastric polyps   ? Gastroparesis   ? GERD (gastroesophageal reflux disease)   ? Headache   ? sinus headaches and migraines at times  ? Heart murmur   ? History of migraine headaches   ? HTN (hypertension)   ? Hyperlipidemia   ? IBS (irritable bowel syndrome)   ? Joint pain   ? Lumbar disc disease   ? PONV (postoperative nausea and vomiting)   ? S/P aortic valve replacement with bioprosthetic valve 08/23/2016  ? 25 mm Edwards Intuity rapid-deployment bovine pericardial tissue valve via partial upper mini sternotomy  ? Sleep apnea   ? CPAP  ? TIA (transient ischemic attack)   ? TIA (transient ischemic attack)   ? hx of per pt  ? ? ?Past Surgical History:  ?Procedure Laterality Date  ? ABDOMINAL HYSTERECTOMY    ? AORTIC VALVE REPLACEMENT N/A 08/23/2016  ? Procedure: AORTIC VALVE REPLACEMENT (AVR) - using partial Upper Sternotomy- 107m Edwards Intuity Aortic Valve used;  Surgeon: CRexene Alberts MD;  [facility]  Service: Open Heart Surgery;  Laterality: N/A;  ? BLADDER SUSPENSION  2007  ? [place] ? CARPAL TUNNEL  RELEASE Bilateral   ? COLONOSCOPY    ? DORSAL COMPARTMENT RELEASE Right 09/15/2014  ? Procedure: RIGHT WRIST DEQUERVAINS RELEASE ;  Surgeon: DKathryne Hitch MD;  [facility]  Service: Orthopedics;  Laterality: Right;  ? ESOPHAGOGASTRODUODENOSCOPY    ? EYE SURGERY    ? cataract surgery  ? KNEE ARTHROSCOPY  04/15/2012  ? Procedure: ARTHROSCOPY KNEE;  Surgeon: DNinetta Lights MD;  [facility]  Service: Orthopedics;  Laterality: Right;  ? LAPAROSCOPIC CHOLECYSTECTOMY    ? LASIK    ? OOPHORECTOMY    ? PALATE TO GINGIVA GRAFT  2017  ? REVERSE SHOULDER ARTHROPLASTY Left 02/13/2021  ? Procedure: REVERSE SHOULDER ARTHROPLASTY;  Surgeon: LMarchia Bond MD;  Location[facility];  Service: Orthopedics;  Laterality: Left;  ? RIGHT/LEFT HEART CATH AND CORONARY ANGIOGRAPHY N/A 08/02/2016  ? Procedure: Right/Left Heart Cath and Coronary Angiography;  Surgeon: MSherren Mocha MD;  [facility];  Service: Cardiovascular;  Laterality: N/A;  ? ROTATOR CUFF REPAIR Left   ? TEE WITHOUT CARDIOVERSION N/A 08/23/2016  ? Procedure: TRANSESOPHAGEAL ECHOCARDIOGRAM (TEE);  Surgeon: CRexene Alberts MD;  [facility] [facility]  Service: Open Heart Surgery;  Laterality: N/A;  ? TOTAL KNEE ARTHROPLASTY  04/15/2012  ? Procedure: TOTAL  KNEE ARTHROPLASTY;  Surgeon: [name], MD;  Location: [place];  Service: Orthopedics;  Laterality: Right;  ? TRIGGER FINGER RELEASE Right 04/20/2015  ? Procedure: RIGHT TRIGGER FINGER RELEASE (TENDON SHEALTH INCISION) ;  Surgeon: [name], MD;  Location: [place];  Service: Orthopedics;  Laterality: Right;  ? ? ?Social History  ? ?Socioeconomic History  ? Marital status: Married  ?  Spouse name: [name]  ? Number of children: 1  ? Years of education: Not on file  ? Highest education level: Not on file  ?Occupational History  ? Occupation: Retired, Advice worker  ?  Employer: RETIRED  ?Tobacco Use  ? Smoking status: Never  ? Smokeless tobacco: Never   ?Vaping Use  ? Vaping Use: Never used  ?Substance and Sexual Activity  ? Alcohol use: No  ? Drug use: No  ? Sexual activity: Yes  ?Other Topics Concern  ? Not on file  ?Social History Narrative  ? She lives with husband (1978) and two dogs. Step-son [name] (1971-psychiatrist in [place]). No grandkids. 2 dogs-sheltie/collie mix and border collie mix  ?   ? Highest level of education:  Master in education  ? She is retired Animal nutritionist x 32 years.  ?   ? Hobbies: time with dogs  ? Right handed  ? One story home  ?   ?   ? ?Social Determinants of Health  ? ?Financial Resource Strain: Not on file  ?Food Insecurity: Not on file  ?Transportation Needs: Not on file  ?Physical Activity: Not on file  ?Stress: Not on file  ?Social Connections: Not on file  ?Intimate Partner Violence: Not on file  ? ? ?Current Outpatient Medications on File Prior to Visit  ?Medication Sig Dispense Refill  ? ACCU-CHEK GUIDE test strip USE TO TEST BLOOD SUGARS DAILY. DX: E11.9 100 strip 12  ? Accu-Chek Softclix Lancets lancets USE AS INSTRUCTED 100 each 12  ? amLODipine (NORVASC) 10 MG tablet Take 1 tablet (10 mg total) by mouth daily. 90 tablet 3  ? atorvastatin (LIPITOR) 40 MG tablet TAKE 1 TABLET BY MOUTH DAILY AT 6 PM. 90 tablet 3  ? Biotin 5000 MCG TABS Take 5,000 mcg by mouth daily.     ? blood glucose meter kit and supplies KIT Dispense based on patient and insurance preference. Use up to four times daily as directed. Dx: E11.9 1 each 0  ? calcium-vitamin D (OSCAL WITH D) 500-200 MG-UNIT per tablet Take 2 tablets by mouth daily.     ? Cholecalciferol (VITAMIN D) 125 MCG (5000 UT) CAPS Take 5,000 Units by mouth in the morning.    ? cloNIDine (CATAPRES) 0.1 MG tablet Take 1 tablet (0.1 mg total) by mouth at bedtime. 90 tablet 3  ? clopidogrel (PLAVIX) 75 MG tablet TAKE 1 TABLET BY MOUTH EVERY DAY 90 tablet 3  ? fluticasone (FLONASE) 50 MCG/ACT nasal spray Place 2 sprays into both nostrils in the morning.    ? ipratropium  (ATROVENT) 0.03 % nasal spray 2 SPRAYS IN EACH NOSTRIL AS NEEDED THREE TIMES AS NEEDED    ? loratadine (CLARITIN) 10 MG tablet Take 10 mg by mouth at bedtime.     ? losartan (COZAAR) 100 MG tablet Take 100 mg by mouth daily. Pt takes in the pm per pt    ? Melatonin 5 MG TABS Take 5 mg by mouth at bedtime as needed (sleep).    ? metFORMIN (GLUCOPHAGE) 500 MG tablet Take 1  tablet (500 mg total) by mouth daily with breakfast. 180 tablet 1  ? metoCLOPramide (REGLAN) 10 MG tablet TAKE 1/2 TABLET BY MOUTH 4 TIMES A DAY 60 tablet 3  ? Multiple Vitamin (MULTIVITAMIN WITH MINERALS) TABS tablet Take 1 tablet by mouth in the morning.    ? omeprazole (PRILOSEC) 40 MG capsule Take 40 mg by mouth daily.    ? Probiotic Product (ALIGN) 4 MG CAPS Take 4 mg by mouth daily.     ? psyllium (METAMUCIL SMOOTH TEXTURE) 28 % packet Take 1 packet by mouth 2 (two) times daily.    ? sodium chloride 1 g tablet Take 3 g by mouth 2 (two) times daily with a meal.    ? venlafaxine XR (EFFEXOR-XR) 75 MG 24 hr capsule TAKE 1 CAPSULE BY MOUTH DAILY WITH BREAKFAST. 90 capsule 2  ? ?No current facility-administered medications on file prior to visit.  ? ? ?Allergies  ?Allergen Reactions  ? Augmentin [Amoxicillin-Pot Clavulanate] Rash  ? Erythromycin Nausea Only  ? Penicillins Itching and Rash  ?  Has patient had a PCN reaction causing immediate rash, facial/tongue/throat swelling, SOB or lightheadedness with hypotension: No ?Has patient had a PCN reaction causing severe rash involving mucus membranes or skin necrosis: No ?Has patient had a PCN reaction that required hospitalization: No ?Has patient had a PCN reaction occurring within the last 10 years: No  ?If all of the above answers are "NO", then may proceed with Cephalosporin use. ?Tolerated Ancef 02/13/21  ? ? ?Family History  ?Problem Relation Age of Onset  ? COPD Mother   ? Colon polyps Mother   ? Irritable bowel syndrome Mother   ? Anxiety disorder Mother   ? Heart disease Father   ? Alcohol  abuse Father   ? Breast cancer Maternal Grandmother   ? Gallbladder disease Maternal Grandmother   ? Colon polyps Maternal Aunt   ? Diabetes Paternal Uncle   ?     several uncles  ? Other Neg Hx   ?     hyponatremia  ? Col

## 2021-09-18 ENCOUNTER — Encounter: Payer: Self-pay | Admitting: Physical Therapy

## 2021-09-18 ENCOUNTER — Ambulatory Visit: Payer: Medicare PPO | Admitting: Physical Therapy

## 2021-09-18 DIAGNOSIS — R600 Localized edema: Secondary | ICD-10-CM

## 2021-09-18 DIAGNOSIS — M5441 Lumbago with sciatica, right side: Secondary | ICD-10-CM

## 2021-09-18 DIAGNOSIS — M5442 Lumbago with sciatica, left side: Secondary | ICD-10-CM | POA: Diagnosis not present

## 2021-09-18 DIAGNOSIS — M25612 Stiffness of left shoulder, not elsewhere classified: Secondary | ICD-10-CM

## 2021-09-18 DIAGNOSIS — Z96612 Presence of left artificial shoulder joint: Secondary | ICD-10-CM

## 2021-09-18 DIAGNOSIS — M25512 Pain in left shoulder: Secondary | ICD-10-CM

## 2021-09-18 LAB — BASIC METABOLIC PANEL
BUN: 22 mg/dL (ref 6–23)
CO2: 25 mEq/L (ref 19–32)
Calcium: 9.1 mg/dL (ref 8.4–10.5)
Chloride: 98 mEq/L (ref 96–112)
Creatinine, Ser: 0.64 mg/dL (ref 0.40–1.20)
GFR: 89.29 mL/min (ref >60.00)
Glucose, Bld: 92 mg/dL (ref 70–99)
Potassium: 5 mEq/L (ref 3.5–5.1)
Sodium: 130 mEq/L — ABNORMAL LOW (ref 135–145)

## 2021-09-18 LAB — TSH: TSH: 4.23 u[IU]/mL (ref 0.35–5.50)

## 2021-09-18 LAB — T4, FREE: Free T4: 1.13 ng/dL (ref 0.60–1.60)

## 2021-09-18 NOTE — Therapy (Signed)
[place] [address] ?[place], [place], [address] ?Phone: [phone]   Fax:  [phone] ? ?Physical Therapy Treatment ? ?Patient Details  ?Name: [name] ?MRN: [id number] ?Date of Birth: January 11, 1951 ?Referring Provider (PT): Mardelle Matte ? ? ?Encounter Date: 09/18/2021 ? ? PT End of Session - 09/18/21 1601   ? ? Visit Number 73   ? Date for PT Re-Evaluation 09/21/21   ? Authorization Type [employer]   ? Authorization Time Period 12/12   ? PT Start Time 1523   ? PT Stop Time 1616   ? PT Time Calculation (min) 53 min   ? Activity Tolerance Patient tolerated treatment well   ? Behavior During Therapy [facility] for tasks assessed/performed   ? ?  ?  ? ?  ? ? ?Past Medical History:  ?Diagnosis Date  ? Allergy   ? Anxiety   ? Arthritis   ? back & knee  ? Asthma   ?  mild per pt shows up with resp illness  ? Colon polyps   ? Constipation   ? Diabetes mellitus without complication ([place])   ? Gallstones   ? Gastric polyps   ? Gastroparesis   ? GERD (gastroesophageal reflux disease)   ? Headache   ? sinus headaches and migraines at times  ? Heart murmur   ? History of migraine headaches   ? HTN (hypertension)   ? Hyperlipidemia   ? IBS (irritable bowel syndrome)   ? Joint pain   ? Lumbar disc disease   ? PONV (postoperative nausea and vomiting)   ? S/P aortic valve replacement with bioprosthetic valve 08/23/2016  ? 25 mm Edwards Intuity rapid-deployment bovine pericardial tissue valve via partial upper mini sternotomy  ? Sleep apnea   ? CPAP  ? TIA (transient ischemic attack)   ? TIA (transient ischemic attack)   ? hx of per pt  ? ? ?Past Surgical History:  ?Procedure Laterality Date  ? ABDOMINAL HYSTERECTOMY    ? AORTIC VALVE REPLACEMENT N/A 08/23/2016  ? Procedure: AORTIC VALVE REPLACEMENT (AVR) - using partial Upper Sternotomy- 73m Edwards Intuity Aortic Valve used;  Surgeon: CRexene Alberts MD;  [facility]  Service: Open Heart Surgery;  Laterality: N/A;  ? BLADDER  SUSPENSION  2007  ? [place] ? CARPAL TUNNEL RELEASE Bilateral   ? COLONOSCOPY    ? DORSAL COMPARTMENT RELEASE Right 09/15/2014  ? Procedure: RIGHT WRIST DEQUERVAINS RELEASE ;  Surgeon: DKathryne Hitch MD;  [facility]  Service: Orthopedics;  Laterality: Right;  ? ESOPHAGOGASTRODUODENOSCOPY    ? EYE SURGERY    ? cataract surgery  ? KNEE ARTHROSCOPY  04/15/2012  ? Procedure: ARTHROSCOPY KNEE;  Surgeon: DNinetta Lights MD;  Location: [facility]  Service: Orthopedics;  Laterality: Right;  ? LAPAROSCOPIC CHOLECYSTECTOMY    ? LASIK    ? OOPHORECTOMY    ? PALATE TO GINGIVA GRAFT  2017  ? REVERSE SHOULDER ARTHROPLASTY Left 02/13/2021  ? Procedure: REVERSE SHOULDER ARTHROPLASTY;  Surgeon: LMarchia Bond MD;  Location: [facility];  Service: Orthopedics;  Laterality: Left;  ? RIGHT/LEFT HEART CATH AND CORONARY ANGIOGRAPHY N/A 08/02/2016  ? Procedure: Right/Left Heart Cath and Coronary Angiography;  Surgeon: MSherren Mocha MD;  [facility];  Service: Cardiovascular;  Laterality: N/A;  ? ROTATOR CUFF REPAIR Left   ? TEE WITHOUT CARDIOVERSION N/A 08/23/2016  ? Procedure: TRANSESOPHAGEAL ECHOCARDIOGRAM (TEE);  Surgeon: CRexene Alberts  MD;  Location[facility];  Service: Open Heart Surgery;  Laterality: N/A;  ? TOTAL KNEE ARTHROPLASTY  04/15/2012  ? Procedure: TOTAL KNEE ARTHROPLASTY;  Surgeon: [name], MD;  Location: [facility];  Service: Orthopedics;  Laterality: Right;  ? TRIGGER FINGER RELEASE Right 04/20/2015  ? Procedure: RIGHT TRIGGER FINGER RELEASE (TENDON SHEALTH INCISION) ;  Surgeon: [name], MD;  Location: [facility];  Service: Orthopedics;  Laterality: Right;  ? ? ?There were no vitals filed for this visit. ? ? Subjective Assessment - 09/18/21 1559   ? ? Subjective Patient reports that she is doing well, still tight in the shoulder, some pain in the upper trap and rhomboid   ? Currently in Pain? Yes   ? Pain Score 2    ? Pain Location  Shoulder   ? Pain Orientation Left   ? Pain Descriptors / Indicators Sore   ? Pain Relieving Factors the treatment has helped   ? ?  ?  ? ?  ? ? ? ? ? ? ? ? ? ? ? ? ? ? ? ? ? ? ? ? [facility] Adult PT Treatment/Exercise - 09/18/21 0001   ? ?  ? Lumbar Exercises: Stretches  ? Passive Hamstring Stretch Right;Left;3 reps;10 seconds   ? Single Knee to Chest Stretch Left;Right;3 reps;10 seconds   ? Double Knee to Chest Stretch 2 reps;10 seconds   ? Lower Trunk Rotation 2 reps;10 seconds   ? Pelvic Tilt 10 reps;5 seconds   ?  ? Vasopneumatic  ? Number Minutes Vasopneumatic  10 minutes   ? Vasopnuematic Location  Shoulder   ? Vasopneumatic Pressure Medium   ? Vasopneumatic Temperature  34   ?  ? Manual Therapy  ? Soft tissue mobilization left buttock and left ITB, left shoulder   ? ?  ?  ? ?  ? ? ? ? ? ? ? ? ? ? ? ? PT Short Term Goals - 09/06/21 1555   ? ?  ? PT SHORT TERM GOAL #1  ? Title independent with intial HEP   ? Status Achieved   ? ?  ?  ? ?  ? ? ? ? PT Long Term Goals - 09/18/21 1614   ? ?  ? PT LONG TERM GOAL #1  ? Title decrease pain 50%   ? Status Achieved   ?  ? PT LONG TERM GOAL #2  ? Title returns safely to the gym   ? Status Achieved   ?  ? PT LONG TERM GOAL #3  ? Title increase AROM of the left shoulder to 120 degrees flexion in standing   ? Status Achieved   ?  ? PT LONG TERM GOAL #4  ? Title report no difficulty dressing or doing hair   ? Status Achieved   ?  ? PT LONG TERM GOAL #5  ? Title decrease lumbar pain and sciatic symptoms 50%   ? Status Achieved   ? ?  ?  ? ?  ? ? ? ? ? ? ? ? Plan - 09/18/21 1602   ? ? Clinical Impression Statement Went over HEP and how to maintain her ROM and her function, she has good ROM and understands the directions.  She has had some pain in the low back but feels the HEP will help.   ? PT Next Visit Plan D/C with goals met   ? Consulted and Agree with Plan of Care Patient   ? ?  ?  ? ?  ? ? ?  Patient will benefit from skilled therapeutic intervention in order to improve the  following deficits and impairments:  Decreased range of motion, Impaired UE functional use, Increased muscle spasms, Pain, Improper body mechanics, Postural dysfunction, Increased edema, Decreased strength, Decreased mobility ? ?Visit Diagnosis: ?Acute bilateral low back pain with bilateral sciatica ? ?S/P reverse total shoulder arthroplasty, left ? ?Stiffness of left shoulder, not elsewhere classified ? ?Localized edema ? ?Acute pain of left shoulder ? ? ? ? ?Problem List ?Patient Active Problem List  ? Diagnosis Date Noted  ? S/P reverse total shoulder arthroplasty, left 02/13/2021  ? Idiopathic hyperphosphatasia 08/03/2019  ? Vitamin D deficiency 06/04/2018  ? Hyponatremia 02/17/2018  ? History of CVA in adulthood 08/13/2017  ? Blurry vision 08/13/2017  ? Hypertension associated with diabetes ([place]) 01/02/2017  ? S/P minimally invasive aortic valve replacement with bioprosthetic valve 08/23/2016  ? Diastolic dysfunction   ? Osteopenia 05/27/2016  ? OSA (obstructive sleep apnea) 02/27/2016  ? Class 1 obesity with serious comorbidity and body mass index (BMI) of 32.0 to 32.9 in adult 10/23/2015  ? Bruxism 10/23/2015  ? Elevated alkaline phosphatase level 05/16/2015  ? Atypical chest pain 11/10/2014  ? Acne 05/12/2014  ? Diabetes mellitus ([place]) 05/12/2014  ? GERD (gastroesophageal reflux disease) 03/10/2014  ? Generalized anxiety disorder 03/10/2014  ? Migraines 03/10/2014  ? EUSTACHIAN TUBE DYSFUNCTION, CHRONIC 06/14/2010  ? Irritable bowel syndrome 12/15/2008  ? Low back pain 08/11/2008  ? Hyperlipidemia associated with type 2 diabetes mellitus ([place]) 02/09/2008  ? Allergic rhinitis 02/12/2007  ? Asthma 02/12/2007  ? ? Sumner Boast, PT ?09/18/2021, 4:15 PM ? ?[place] ?[address] ?[place], [place], [address] ?Phone: [phone]   Fax:  [phone] ? ?Name: [name] ?MRN: [id number] ?Date of Birth: 1950/09/11 ? ? ? ?

## 2021-09-18 NOTE — Patient Instructions (Signed)
Access Code: [email or web address] ?URL: [email or web address] ?Date: 09/18/2021 ?Prepared by: Lum Babe ? ?Exercises ?- Hooklying Single Knee to Chest Stretch  - 1 x daily - 7 x weekly - 2 sets - 10 reps - 10 hold ?- Supine Double Knee to Chest  - 1 x daily - 7 x weekly - 2 sets - 10 reps - 10 hold ?- Supine Lower Trunk Rotation  - 1 x daily - 7 x weekly - 2 sets - 10 reps - 10 hold ?- Supine Posterior Pelvic Tilt  - 1 x daily - 7 x weekly - 2 sets - 10 reps - 5 hold ?

## 2021-09-19 LAB — ALKALINE PHOSPHATASE, BONE SPECIFIC: ALKALINE PHOSPHATASE, BONE SPECIFIC: 59 mcg/L — ABNORMAL HIGH (ref 5.6–29.0)

## 2021-09-20 ENCOUNTER — Encounter: Payer: Medicare PPO | Admitting: Physical Therapy

## 2021-09-24 ENCOUNTER — Ambulatory Visit: Payer: Medicare PPO | Admitting: Family Medicine

## 2021-09-24 ENCOUNTER — Encounter: Payer: Self-pay | Admitting: Family Medicine

## 2021-09-24 VITALS — BP 124/60 | HR 70 | Temp 97.2°F | Ht 66.0 in | Wt 194.4 lb

## 2021-09-24 DIAGNOSIS — E1169 Type 2 diabetes mellitus with other specified complication: Secondary | ICD-10-CM

## 2021-09-24 DIAGNOSIS — I10 Essential (primary) hypertension: Secondary | ICD-10-CM | POA: Diagnosis not present

## 2021-09-24 DIAGNOSIS — K219 Gastro-esophageal reflux disease without esophagitis: Secondary | ICD-10-CM

## 2021-09-24 DIAGNOSIS — Z9989 Dependence on other enabling machines and devices: Secondary | ICD-10-CM | POA: Diagnosis not present

## 2021-09-24 DIAGNOSIS — F411 Generalized anxiety disorder: Secondary | ICD-10-CM | POA: Diagnosis not present

## 2021-09-24 DIAGNOSIS — E785 Hyperlipidemia, unspecified: Secondary | ICD-10-CM | POA: Diagnosis not present

## 2021-09-24 DIAGNOSIS — G4733 Obstructive sleep apnea (adult) (pediatric): Secondary | ICD-10-CM | POA: Diagnosis not present

## 2021-09-24 DIAGNOSIS — Z6836 Body mass index (BMI) 36.0-36.9, adult: Secondary | ICD-10-CM

## 2021-09-24 LAB — CBC WITH DIFFERENTIAL/PLATELET
Basophils Absolute: 0.1 10*3/uL (ref 0.0–0.1)
Basophils Relative: 0.9 % (ref 0.0–3.0)
Eosinophils Absolute: 0.1 10*3/uL (ref 0.0–0.7)
Eosinophils Relative: 1 % (ref 0.0–5.0)
HCT: 36.7 % (ref 36.0–46.0)
Hemoglobin: 12 g/dL (ref 12.0–15.0)
Lymphocytes Relative: 34.5 % (ref 12.0–46.0)
Lymphs Abs: 2.6 10*3/uL (ref 0.7–4.0)
MCHC: 32.8 g/dL (ref 30.0–36.0)
MCV: 83.4 fl (ref 78.0–100.0)
Monocytes Absolute: 0.7 10*3/uL (ref 0.1–1.0)
Monocytes Relative: 9.6 % (ref 3.0–12.0)
Neutro Abs: 4.1 10*3/uL (ref 1.4–7.7)
Neutrophils Relative %: 54 % (ref 43.0–77.0)
Platelets: 309 10*3/uL (ref 150.0–400.0)
RBC: 4.4 Mil/uL (ref 3.87–5.11)
RDW: 15.2 % (ref 11.5–15.5)
WBC: 7.5 10*3/uL (ref 4.0–10.5)

## 2021-09-24 LAB — HEMOGLOBIN A1C: Hgb A1c MFr Bld: 6.3 % (ref 4.6–6.5)

## 2021-09-24 NOTE — Addendum Note (Signed)
Addended by: [name] on: 09/24/2021 01:03 PM ? ? Modules accepted: Orders ? ?

## 2021-09-24 NOTE — Progress Notes (Signed)
? ?NEUROLOGY FOLLOW UP OFFICE NOTE ? ?[name] ?[id number] ? ?Assessment/Plan:  ? ?1.  Migraine with persistent aura, with cerebral infarction ?2.  Migraine with aura ?3.  HTN ?4.  Type 2 diabetes mellitus ?5.  Hyperlipidemia ?  ?1.  Migraine prevention:  Venlafaxine XR 41m daily ?2.  Limit use of pain relievers to no more than 2 days out of week to prevent risk of rebound or medication-overuse headache. ?3.  Secondary stroke prevention as managed by PCP: ?-  Plavix 742mdaily ?-  Statin therapy.  LDL goal less than 70 ?-  Blood pressure control - follow up with PCP ?-  Glycemic control.  Hgb A1c goal less than 7 ?4.  Follow up one year ? ?Subjective:  ?Hayley P Hayley MCGILVRAYs a 89ear old right-handed female with type 2 diabetes, hypertension, hyperlipidemia, status post bovine aortic valve and migraine who follows up for stroke and migraine. ?  ?UPDATE: ? Current medications: Plavix, atorvastatin 40 mg, Lopressor, insulin, metformin.  For migraine prophylaxis, she is taking venlafaxine XR 75 mg daily.  Family medicine notes reviewed. ?  ?No migraines since last visit.  May have a mild headache with sinusitis.  Had a left shoulder replacement after an injury.   ?  ?HISTORY: ?[name] has history of migraines since high school.  They are left sided frontal throbbing headaches of moderate to severe intensity.  They typically last 3 hours and occur once a week.  They are preceded by an aura of spots in the vision of both eyes, lasting 2 hours.  They are aggravated by stress and relieved by rest.  She typically takes Imitrex, which is effective. ?  ?On 08/07/17, she developed one of her habitual migraines with aura.  However, the headache was more severe and the visual aura did not resolve.  The headache was severe for about 3 days and then tapered off after another 4 days.  The visual aura lasted up to a week.  She did not have any facial droop, unilateral numbness or weakness, ataxia or vertigo.  CT of  head from 08/12/17 was personally reviewed and revealed a small patchy hypodensity in the right occipital lobe concerning for subacute infarct.  MRA of head from 08/21/17 was personally reviewed and revealed flow-limiting stenosis versus artifact in the left M2 segement but otherwise intracranial arteries patent, including right PCA.  Carotid doppler from 08/19/17 revealed no hemodynamically significant bilateral ICA stenosis and both vertebral arteries were patent with antegrade flow.  Echocardiogram from yesterday showed EF 60-65% with no cardiac source of emboli.  She is currently with heart monitor. ?  ?Her ASA 8132mas switched to Plavix 52m78md she was advised to take atorvastatin daily.  She was advised to stop Imitrex.  Tylenol is ineffective.  She takes venlafaxine XR 150mg45mly for anxiety. ? ?PAST MEDICAL HISTORY: ?Past Medical History:  ?Diagnosis Date  ? Allergy   ? Anxiety   ? Arthritis   ? back & knee  ? Asthma   ?  mild per pt shows up with resp illness  ? Colon polyps   ? Constipation   ? Diabetes mellitus without complication (HCC) [place] Gallstones   ? Gastric polyps   ? Gastroparesis   ? GERD (gastroesophageal reflux disease)   ? Headache   ? sinus headaches and migraines at times  ? Heart murmur   ? History of migraine headaches   ? HTN (hypertension)   ? Hyperlipidemia   ?  IBS (irritable bowel syndrome)   ? Joint pain   ? Lumbar disc disease   ? PONV (postoperative nausea and vomiting)   ? S/P aortic valve replacement with bioprosthetic valve 08/23/2016  ? 25 mm Edwards Intuity rapid-deployment bovine pericardial tissue valve via partial upper mini sternotomy  ? Sleep apnea   ? CPAP  ? TIA (transient ischemic attack)   ? TIA (transient ischemic attack)   ? hx of per pt  ? ? ?MEDICATIONS: ?Current Outpatient Medications on File Prior to Visit  ?Medication Sig Dispense Refill  ? ACCU-CHEK GUIDE test strip USE TO TEST BLOOD SUGARS DAILY. DX: E11.9 100 strip 12  ? Accu-Chek Softclix Lancets lancets USE  AS INSTRUCTED 100 each 12  ? amLODipine (NORVASC) 10 MG tablet Take 1 tablet (10 mg total) by mouth daily. 90 tablet 3  ? atorvastatin (LIPITOR) 40 MG tablet TAKE 1 TABLET BY MOUTH DAILY AT 6 PM. 90 tablet 3  ? Biotin 5000 MCG TABS Take 5,000 mcg by mouth daily.     ? blood glucose meter kit and supplies KIT Dispense based on patient and insurance preference. Use up to four times daily as directed. Dx: E11.9 1 each 0  ? calcium-vitamin D (OSCAL WITH D) 500-200 MG-UNIT per tablet Take 2 tablets by mouth daily.     ? Cholecalciferol (VITAMIN D) 125 MCG (5000 UT) CAPS Take 5,000 Units by mouth in the morning.    ? cloNIDine (CATAPRES) 0.1 MG tablet Take 1 tablet (0.1 mg total) by mouth at bedtime. 90 tablet 3  ? clopidogrel (PLAVIX) 75 MG tablet TAKE 1 TABLET BY MOUTH EVERY DAY 90 tablet 3  ? demeclocycline (DECLOMYCIN) 300 MG tablet Take 1 tablet (300 mg total) by mouth 2 (two) times daily. 180 tablet 3  ? fluticasone (FLONASE) 50 MCG/ACT nasal spray Place 2 sprays into both nostrils in the morning.    ? ipratropium (ATROVENT) 0.03 % nasal spray 2 SPRAYS IN EACH NOSTRIL AS NEEDED THREE TIMES AS NEEDED    ? loratadine (CLARITIN) 10 MG tablet Take 10 mg by mouth at bedtime.     ? losartan (COZAAR) 100 MG tablet Take 100 mg by mouth daily. Pt takes in the pm per pt    ? Melatonin 5 MG TABS Take 5 mg by mouth at bedtime as needed (sleep).    ? metFORMIN (GLUCOPHAGE) 500 MG tablet Take 1 tablet (500 mg total) by mouth daily with breakfast. 180 tablet 1  ? metoCLOPramide (REGLAN) 10 MG tablet TAKE 1/2 TABLET BY MOUTH 4 TIMES A DAY 60 tablet 3  ? Multiple Vitamin (MULTIVITAMIN WITH MINERALS) TABS tablet Take 1 tablet by mouth in the morning.    ? omeprazole (PRILOSEC) 40 MG capsule Take 40 mg by mouth daily.    ? Probiotic Product (ALIGN) 4 MG CAPS Take 4 mg by mouth daily.     ? psyllium (METAMUCIL SMOOTH TEXTURE) 28 % packet Take 1 packet by mouth 2 (two) times daily.    ? sodium chloride 1 g tablet Take 3 g by mouth 2  (two) times daily with a meal.    ? venlafaxine XR (EFFEXOR-XR) 75 MG 24 hr capsule TAKE 1 CAPSULE BY MOUTH DAILY WITH BREAKFAST. 90 capsule 2  ? ?No current facility-administered medications on file prior to visit.  ? ? ?ALLERGIES: ?Allergies  ?Allergen Reactions  ? Augmentin [Amoxicillin-Pot Clavulanate] Rash  ? Erythromycin Nausea Only  ? Penicillins Itching and Rash  ?  Has patient had a  PCN reaction causing immediate rash, facial/tongue/throat swelling, SOB or lightheadedness with hypotension: No ?Has patient had a PCN reaction causing severe rash involving mucus membranes or skin necrosis: No ?Has patient had a PCN reaction that required hospitalization: No ?Has patient had a PCN reaction occurring within the last 10 years: No  ?If all of the above answers are "NO", then may proceed with Cephalosporin use. ?Tolerated Ancef 02/13/21  ? ? ?FAMILY HISTORY: ?Family History  ?Problem Relation Age of Onset  ? COPD Mother   ? Colon polyps Mother   ? Irritable bowel syndrome Mother   ? Anxiety disorder Mother   ? Heart disease Father   ? Alcohol abuse Father   ? Breast cancer Maternal Grandmother   ? Gallbladder disease Maternal Grandmother   ? Colon polyps Maternal Aunt   ? Diabetes Paternal Uncle   ?     several uncles  ? Other Neg Hx   ?     hyponatremia  ? Colon cancer Neg Hx   ? Esophageal cancer Neg Hx   ? Rectal cancer Neg Hx   ? Stomach cancer Neg Hx   ? ? ?  ?Objective:  ?Blood pressure 130/70, pulse 74, resp. rate 18, height 5' 6"  (1.676 m), weight 195 lb (88.5 kg), SpO2 98 %. ?General: No acute distress.  Patient appears well-groomed.   ?Head:  Normocephalic/atraumatic ?Eyes:  Fundi examined but not visualized ?Neck: supple, no paraspinal tenderness, full range of motion ?Heart:  Regular rate and rhythm ?Lungs:  Clear to auscultation bilaterally ?Back: No paraspinal tenderness ?Neurological Exam: alert and oriented to person, place, and time.  Speech fluent and not dysarthric, language intact.  CN II-XII  intact. Bulk and tone normal, muscle strength 5/5 throughout.  Sensation to light touch intact.  Deep tendon reflexes 2+ throughout, toes downgoing.  Finger to nose testing intact.  Gait normal, Romberg neg

## 2021-09-24 NOTE — Assessment & Plan Note (Signed)
Controlled. Continue current medications-amlodipine 10 mg, irbesartan 300 mg, clonidine 0.1 milligrams nightly primarily to see if this would help with sweating ?

## 2021-09-24 NOTE — Patient Instructions (Addendum)
Sign release of information at the check out desk for diabetic eye exam.  ? ?Please have [facility] send [name] a copy of your mammogram ? ?- ? If on losartan or not- she will go home and check- '100mg'$  on medication list and on our list had been on 25 mg in the past- she will mychart [name]  ? ?Please stop by lab before you go ?If you have mychart- we will send your results within 3 business days of [name] receiving them.  ?If you do not have mychart- we will call you about results within 5 business days of [name] receiving them.  ?*please also note that you will see labs on mychart as soon as they post. I will later go in and write notes on them- will say "notes from Dr. Yong Channel"  ? ?Recommended follow up: Return in about 6 months (around 03/26/2022) for physical or sooner if needed.Schedule b4 you leave.  ?

## 2021-09-25 ENCOUNTER — Encounter: Payer: Self-pay | Admitting: Neurology

## 2021-09-25 ENCOUNTER — Ambulatory Visit: Payer: Medicare PPO | Admitting: Neurology

## 2021-09-25 VITALS — BP 130/70 | HR 74 | Resp 18 | Ht 66.0 in | Wt 195.0 lb

## 2021-09-25 DIAGNOSIS — E785 Hyperlipidemia, unspecified: Secondary | ICD-10-CM | POA: Diagnosis not present

## 2021-09-25 DIAGNOSIS — G43109 Migraine with aura, not intractable, without status migrainosus: Secondary | ICD-10-CM

## 2021-09-25 DIAGNOSIS — I10 Essential (primary) hypertension: Secondary | ICD-10-CM

## 2021-09-25 DIAGNOSIS — E119 Type 2 diabetes mellitus without complications: Secondary | ICD-10-CM

## 2021-09-25 DIAGNOSIS — I639 Cerebral infarction, unspecified: Secondary | ICD-10-CM | POA: Diagnosis not present

## 2021-09-25 DIAGNOSIS — G43609 Persistent migraine aura with cerebral infarction, not intractable, without status migrainosus: Secondary | ICD-10-CM | POA: Diagnosis not present

## 2021-09-25 DIAGNOSIS — M5416 Radiculopathy, lumbar region: Secondary | ICD-10-CM | POA: Diagnosis not present

## 2021-09-26 ENCOUNTER — Encounter: Payer: Self-pay | Admitting: Neurology

## 2021-09-27 DIAGNOSIS — Z1231 Encounter for screening mammogram for malignant neoplasm of breast: Secondary | ICD-10-CM | POA: Diagnosis not present

## 2021-09-27 LAB — HM MAMMOGRAPHY

## 2021-10-01 ENCOUNTER — Ambulatory Visit: Payer: Medicare PPO

## 2021-10-01 DIAGNOSIS — G4733 Obstructive sleep apnea (adult) (pediatric): Secondary | ICD-10-CM

## 2021-10-02 ENCOUNTER — Encounter (INDEPENDENT_AMBULATORY_CARE_PROVIDER_SITE_OTHER): Payer: Self-pay | Admitting: Physician Assistant

## 2021-10-02 ENCOUNTER — Ambulatory Visit (INDEPENDENT_AMBULATORY_CARE_PROVIDER_SITE_OTHER): Payer: Medicare PPO | Admitting: Physician Assistant

## 2021-10-02 VITALS — BP 96/62 | HR 68 | Temp 98.9°F | Ht 66.0 in | Wt 191.0 lb

## 2021-10-02 DIAGNOSIS — Z6831 Body mass index (BMI) 31.0-31.9, adult: Secondary | ICD-10-CM | POA: Diagnosis not present

## 2021-10-02 DIAGNOSIS — Z7984 Long term (current) use of oral hypoglycemic drugs: Secondary | ICD-10-CM

## 2021-10-02 DIAGNOSIS — E669 Obesity, unspecified: Secondary | ICD-10-CM

## 2021-10-02 DIAGNOSIS — E1169 Type 2 diabetes mellitus with other specified complication: Secondary | ICD-10-CM | POA: Diagnosis not present

## 2021-10-03 DIAGNOSIS — M5416 Radiculopathy, lumbar region: Secondary | ICD-10-CM | POA: Diagnosis not present

## 2021-10-08 NOTE — Progress Notes (Signed)
? ? ? ?Chief Complaint:  ? ?OBESITY ?[name] is here to discuss her progress with her obesity treatment plan along with follow-up of her obesity related diagnoses. Roe is on the Category 3 Plan and keeping a food journal and adhering to recommended goals of 450-600 calories and 40 grams of  protein at supper and states she is following her eating plan approximately 90% of the time. Jasiya states she is doing 0 minutes 0 times per week. ? ?Today's visit was #: 65 ?Starting weight: 220 lbs ?Starting date: 01/19/2018 ?Today's weight: 191 lbs ?Today's date: 10/02/2021 ?Total lbs lost to date: 29 lbs ?Total lbs lost since last in-office visit: 1 lb ? ?Interim History: Calene continues to do well with weight loss. She is following Category 3. She eats out at lunch most days at [place], [place], or [employer]. Her hunger is controlled.  ? ?Subjective:  ? ?1. Type 2 diabetes mellitus with other specified complication, without long-term current use of insulin ([place]) ?Nahomi's last A1C was 6.3, stable and controlled. She is currently on Metformin. Her last fasting blood sugar was in the range of 125-151. She sees Dr. Loanne Drilling but he is leaving the practice and she will start seeing a new endocrinologist.  ? ?Assessment/Plan:  ? ?1. Type 2 diabetes mellitus with other specified complication, without long-term current use of insulin ([place]) ?Rain will continue with plan and Metformin. Good blood sugar control is important to decrease the likelihood of diabetic complications such as nephropathy, neuropathy, limb loss, blindness, coronary artery disease, and death. Intensive lifestyle modification including diet, exercise and weight loss are the first line of treatment for diabetes.  ? ?2. Obesity with current BMI of 31.0 ?Jessicah is currently in the action stage of change. As such, her goal is to continue with weight loss efforts. She has agreed to the Category 3 Plan and keeping a food journal and  adhering to recommended goals of 450-600 calories and 40 grams of protein at supper.  ? ?Exercise goals: No exercise has been prescribed at this time. ? ?Behavioral modification strategies: no skipping meals and meal planning and cooking strategies. ? ?Aveena has agreed to follow-up with our clinic in 4 weeks. She was informed of the importance of frequent follow-up visits to maximize her success with intensive lifestyle modifications for her multiple health conditions.  ? ?Objective:  ? ?Blood pressure 96/62, pulse 68, temperature 98.9 ?F (37.2 ?C), height '5\' 6"'$  (1.676 m), weight 191 lb (86.6 kg), SpO2 97 %. ?Body mass index is 30.83 kg/m?. ? ?General: Cooperative, alert, well developed, in no acute distress. ?HEENT: Conjunctivae and lids unremarkable. ?Cardiovascular: Regular rhythm.  ?Lungs: Normal work of breathing. ?Neurologic: No focal deficits.  ? ?Lab Results  ?Component Value Date  ? CREATININE 0.64 09/17/2021  ? BUN 22 09/17/2021  ? NA 130 (L) 09/17/2021  ? K 5.0 09/17/2021  ? CL 98 09/17/2021  ? CO2 25 09/17/2021  ? ?Lab Results  ?Component Value Date  ? ALT 15 06/05/2021  ? AST 27 06/05/2021  ? ALKPHOS 251 (H) 06/05/2021  ? BILITOT 0.4 06/05/2021  ? ?Lab Results  ?Component Value Date  ? HGBA1C 6.3 09/24/2021  ? HGBA1C 6.6 (H) 06/05/2021  ? HGBA1C 6.5 (H) 02/09/2021  ? HGBA1C 6.3 (H) 01/25/2021  ? HGBA1C 5.7 (H) 04/20/2020  ? ?Lab Results  ?Component Value Date  ? INSULIN 12.9 06/05/2021  ? INSULIN 7.7 01/25/2021  ? INSULIN 13.1 04/20/2020  ? INSULIN 12.1 01/19/2018  ? ?Lab Results  ?  Component Value Date  ? TSH 4.23 09/17/2021  ? ?Lab Results  ?Component Value Date  ? CHOL 144 06/05/2021  ? HDL 64 06/05/2021  ? [place] 65 06/05/2021  ? LDLDIRECT 58.0 11/25/2017  ? TRIG 75 06/05/2021  ? CHOLHDL 2.3 06/05/2021  ? ?Lab Results  ?Component Value Date  ? VD25OH 70.1 06/05/2021  ? VD25OH 74.5 01/25/2021  ? VD25OH 66.5 04/20/2020  ? ?Lab Results  ?Component Value Date  ? WBC 7.5 09/24/2021  ? HGB 12.0  09/24/2021  ? HCT 36.7 09/24/2021  ? MCV 83.4 09/24/2021  ? PLT 309.0 09/24/2021  ? ?No results found for: IRON, TIBC, FERRITIN ? ?Obesity Behavioral Intervention:  ? ?Approximately 15 minutes were spent on the discussion below. ? ?ASK: ?We discussed the diagnosis of obesity with [name] today and Nicie agreed to give [name] permission to discuss obesity behavioral modification therapy today. ? ?ASSESS: ?Isley has the diagnosis of obesity and her BMI today is 30.9. Cambrie is in the action stage of change.  ? ?ADVISE: ?Shalimar was educated on the multiple health risks of obesity as well as the benefit of weight loss to improve her health. She was advised of the need for long term treatment and the importance of lifestyle modifications to improve her current health and to decrease her risk of future health problems. ? ?AGREE: ?Multiple dietary modification options and treatment options were discussed and Meenakshi agreed to follow the recommendations documented in the above note. ? ?ARRANGE: ?Ereka was educated on the importance of frequent visits to treat obesity as outlined per CMS and USPSTF guidelines and agreed to schedule her next follow up appointment today. ? ?Attestation Statements:  ? ?Reviewed by clinician on day of visit: allergies, medications, problem list, medical history, surgical history, family history, social history, and previous encounter notes. ? ?I, Tonye Pearson, am acting as Location manager for [employer], PA-C. ? ?I have reviewed the above documentation for accuracy and completeness, and I agree with the above. [name], PA-C ? ?

## 2021-10-10 ENCOUNTER — Ambulatory Visit: Payer: Medicare PPO

## 2021-10-10 DIAGNOSIS — G4733 Obstructive sleep apnea (adult) (pediatric): Secondary | ICD-10-CM | POA: Diagnosis not present

## 2021-10-12 DIAGNOSIS — G4733 Obstructive sleep apnea (adult) (pediatric): Secondary | ICD-10-CM | POA: Diagnosis not present

## 2021-10-15 ENCOUNTER — Telehealth: Payer: Self-pay | Admitting: Family Medicine

## 2021-10-15 NOTE — Telephone Encounter (Signed)
Copied from [place] [phone]. Topic: Medicare AWV ?>> Oct 15, 2021  1:38 PM Harris-Coley, Hannah Beat wrote: ?Reason for CRM: Left message for patient to schedule Annual Wellness Visit.  Please schedule with Nurse Health Advisor Charlott Rakes, RN at [facility].  Please call [phone] ask for Juliann Pulse ?

## 2021-10-18 DIAGNOSIS — D2271 Melanocytic nevi of right lower limb, including hip: Secondary | ICD-10-CM | POA: Diagnosis not present

## 2021-10-18 DIAGNOSIS — D225 Melanocytic nevi of trunk: Secondary | ICD-10-CM | POA: Diagnosis not present

## 2021-10-18 DIAGNOSIS — D2261 Melanocytic nevi of right upper limb, including shoulder: Secondary | ICD-10-CM | POA: Diagnosis not present

## 2021-10-18 DIAGNOSIS — L821 Other seborrheic keratosis: Secondary | ICD-10-CM | POA: Diagnosis not present

## 2021-10-18 DIAGNOSIS — D2371 Other benign neoplasm of skin of right lower limb, including hip: Secondary | ICD-10-CM | POA: Diagnosis not present

## 2021-10-18 DIAGNOSIS — D2262 Melanocytic nevi of left upper limb, including shoulder: Secondary | ICD-10-CM | POA: Diagnosis not present

## 2021-10-18 DIAGNOSIS — L814 Other melanin hyperpigmentation: Secondary | ICD-10-CM | POA: Diagnosis not present

## 2021-10-18 DIAGNOSIS — D2272 Melanocytic nevi of left lower limb, including hip: Secondary | ICD-10-CM | POA: Diagnosis not present

## 2021-10-18 DIAGNOSIS — L910 Hypertrophic scar: Secondary | ICD-10-CM | POA: Diagnosis not present

## 2021-10-19 ENCOUNTER — Telehealth: Payer: Self-pay | Admitting: Adult Health

## 2021-10-19 NOTE — Telephone Encounter (Signed)
Home sleep study October 10, 2021 showed mild sleep apnea with AHI at 16.5/hour and SPO2 low at 77%.  Please set up office visit to review results and discuss treatment plan ?

## 2021-10-19 NOTE — Telephone Encounter (Signed)
-----   Message from Donne Hazel sent at 10/12/2021  4:47 PM EDT ----- ?Hello this patient's HST report is ready for review  ?

## 2021-10-22 NOTE — Telephone Encounter (Signed)
Called and spoke with patient, advised of results/recommendations per Rexene Edison NP. She verbalized understanding.  Scheduled OV for 10/29/21 at 3:30 pm, advised to arrive by 3:15 pm for check in. ?

## 2021-10-25 ENCOUNTER — Other Ambulatory Visit: Payer: Self-pay

## 2021-10-25 MED ORDER — IRBESARTAN 300 MG PO TABS: 300.0000 mg | ORAL_TABLET | Freq: Every day | ORAL | 3 refills | Status: DC

## 2021-10-25 NOTE — Telephone Encounter (Signed)
Pt's medication was sent to pt's pharmacy as requested. Confirmation received.  °

## 2021-10-29 ENCOUNTER — Encounter: Payer: Self-pay | Admitting: Adult Health

## 2021-10-29 ENCOUNTER — Ambulatory Visit: Payer: Medicare PPO | Admitting: Adult Health

## 2021-10-29 VITALS — BP 110/60 | HR 76 | Temp 99.7°F | Ht 66.0 in | Wt 201.6 lb

## 2021-10-29 DIAGNOSIS — Z6832 Body mass index (BMI) 32.0-32.9, adult: Secondary | ICD-10-CM | POA: Diagnosis not present

## 2021-10-29 DIAGNOSIS — E669 Obesity, unspecified: Secondary | ICD-10-CM | POA: Diagnosis not present

## 2021-10-29 DIAGNOSIS — G4733 Obstructive sleep apnea (adult) (pediatric): Secondary | ICD-10-CM | POA: Diagnosis not present

## 2021-10-29 NOTE — Patient Instructions (Addendum)
Refer to ENT for INSPIRE Device evaluation  ?Do not drive if sleepy .  ?Work on healthy weight  ?Follow up with Dr. Halford Chessman  Or Kamau Weatherall in 3-4 months  and As needed   ? ?

## 2021-10-29 NOTE — Progress Notes (Signed)
? ?_0  ID: [name], female    DOB: 1950/10/19, 71 y.o.   MRN: [id number] ? ?Chief Complaint  ?Patient presents with  ? Follow-up  ? ? ?Referring provider: ?[name], MD ? ?HPI: ?71 year old female followed for obstructive sleep apnea ?Medical history significant for hypertension, diabetes, stroke, diastolic dysfunction and aortic valve replacement ? ?TEST/EVENTS :  ?Home sleep study July 2017 AHI 19/hr  ? ?Home sleep study October 10, 2021 showed moderate sleep apnea with AHI at 16.5/hour and SPO2 low at 77% ? ?10/29/2021 Follow up : OSA ?Patient presents for a 14-monthfollow-up.  Patient has underlying sleep apnea. ?Had repeat sleep study.  Absolutely cannot wear CPAP.  Has tried it for long time and just is miserable trying to wear it.  Has not worn it for the last several months.  Wants to try to see if she can qualify for inspire device .  She complains that the CPAP is uncomfortable wakes her up throughout the night.  She has tried multiple mask in the past.  She says that she is try to do her best to wear it before but absolutely cannot wear it any longer.  She says she has not worn her CPAP in greater than 6 months.  She feels tired with daytime sleepiness and has restless sleep. ?She is trying to work on weight loss that is a slow process.  Last visit patient she was referred to ENT for evaluation of inspire.  She needed a repeat sleep study that was done on October 10, 2021.  Showed moderate sleep apnea with AHI 16.5/hour and SPO2 low at 77%.  We discussed her sleep study results.  And discussed referral to ENT for inspire evaluation and work-up.   ? ? ?Allergies  ?Allergen Reactions  ? Augmentin [Amoxicillin-Pot Clavulanate] Rash  ? Erythromycin Nausea Only  ? Penicillins Itching and Rash  ?  Has patient had a PCN reaction causing immediate rash, facial/tongue/throat swelling, SOB or lightheadedness with hypotension: No ?Has patient had a PCN reaction causing severe rash involving mucus  membranes or skin necrosis: No ?Has patient had a PCN reaction that required hospitalization: No ?Has patient had a PCN reaction occurring within the last 10 years: No  ?If all of the above answers are "NO", then may proceed with Cephalosporin use. ?Tolerated Ancef 02/13/21  ? ? ?Immunization History  ?Administered Date(s) Administered  ? Influenza Split 04/04/2011, 03/05/2012  ? Influenza Whole 04/16/2007, 03/16/2009, 03/13/2010  ? Influenza, High Dose Seasonal PF 01/31/2019, 02/27/2020, 02/28/2020, 03/27/2021  ? Influenza,inj,Quad PF,6+ Mos 03/30/2013, 03/10/2014, 03/23/2015, 02/27/2016  ? Influenza-Unspecified 02/27/2017, 02/28/2017, 01/31/2018  ? PFIZER Comirnaty(Gray Top)Covid-19 Tri-Sucrose Vaccine 11/17/2020  ? PFIZER(Purple Top)SARS-COV-2 Vaccination 08/15/2019, 09/08/2019, 03/31/2020  ? Pneumococcal Conjugate-13 05/21/2016  ? Pneumococcal Polysaccharide-23 05/23/2017  ? Td 06/24/1996, 08/06/2007, 11/07/2017  ? Tdap 11/07/2017  ? Zoster Recombinat (Shingrix) 01/21/2019, 03/24/2019  ? Zoster, Live 05/12/2014  ? ? ?Past Medical History:  ?Diagnosis Date  ? Allergy   ? Anxiety   ? Arthritis   ? back & knee  ? Asthma   ?  mild per pt shows up with resp illness  ? Colon polyps   ? Constipation   ? [place] mellitus [place]   ? Gallstones   ? Gastric polyps   ? Gastroparesis   ? GERD (gastroesophageal reflux disease)   ? Headache   ? sinus headaches and migraines at times  ? Heart murmur   ? History of migraine headaches   ?  HTN (hypertension)   ? Hyperlipidemia   ? IBS (irritable bowel syndrome)   ? Joint pain   ? Lumbar disc disease   ? PONV (postoperative nausea and vomiting)   ? S/P aortic valve replacement with bioprosthetic valve 08/23/2016  ? 25 mm Edwards Intuity rapid-deployment bovine pericardial tissue valve via partial upper mini sternotomy  ? Sleep apnea   ? CPAP  ? TIA (transient ischemic attack)   ? TIA (transient ischemic attack)   ? hx of per pt  ? ? ?Tobacco History: ?Social  History  ? ?Tobacco Use  ?Smoking Status Never  ?Smokeless Tobacco Never  ? ?Counseling given: Not Answered ? ? ?Outpatient Medications Prior to Visit  ?Medication Sig Dispense Refill  ? ACCU-CHEK GUIDE test strip USE TO TEST BLOOD SUGARS DAILY. DX: E11.9 100 strip 12  ? Accu-Chek Softclix Lancets lancets USE AS INSTRUCTED 100 each 12  ? amLODipine (NORVASC) 10 MG tablet Take 1 tablet (10 mg total) by mouth daily. 90 tablet 3  ? atorvastatin (LIPITOR) 40 MG tablet TAKE 1 TABLET BY MOUTH DAILY AT 6 PM. 90 tablet 3  ? Biotin 5000 MCG TABS Take 5,000 mcg by mouth daily.     ? blood glucose meter kit and supplies KIT Dispense based on patient and insurance preference. Use up to four times daily as directed. Dx: E11.9 1 each 0  ? calcium-vitamin D (OSCAL WITH D) 500-200 MG-UNIT per tablet Take 2 tablets by mouth daily.     ? Cholecalciferol (VITAMIN D) 125 MCG (5000 UT) CAPS Take 5,000 Units by mouth in the morning.    ? cloNIDine (CATAPRES) 0.1 MG tablet Take 1 tablet (0.1 mg total) by mouth at bedtime. 90 tablet 3  ? clopidogrel (PLAVIX) 75 MG tablet TAKE 1 TABLET BY MOUTH EVERY DAY 90 tablet 3  ? demeclocycline (DECLOMYCIN) 300 MG tablet Take 1 tablet (300 mg total) by mouth 2 (two) times daily. 180 tablet 3  ? fluticasone (FLONASE) 50 MCG/ACT nasal spray Place 2 sprays into both nostrils in the morning.    ? ipratropium (ATROVENT) 0.03 % nasal spray 2 SPRAYS IN EACH NOSTRIL AS NEEDED THREE TIMES AS NEEDED    ? irbesartan (AVAPRO) 300 MG tablet Take 1 tablet (300 mg total) by mouth daily. 90 tablet 3  ? loratadine (CLARITIN) 10 MG tablet Take 10 mg by mouth at bedtime.     ? Melatonin 5 MG TABS Take 5 mg by mouth at bedtime as needed (sleep).    ? metFORMIN (GLUCOPHAGE) 500 MG tablet Take 1 tablet (500 mg total) by mouth daily with breakfast. 180 tablet 1  ? metoCLOPramide (REGLAN) 10 MG tablet TAKE 1/2 TABLET BY MOUTH 4 TIMES A DAY 60 tablet 3  ? Multiple Vitamin (MULTIVITAMIN WITH MINERALS) TABS tablet Take 1 tablet  by mouth in the morning.    ? omeprazole (PRILOSEC) 40 MG capsule Take 40 mg by mouth daily.    ? Probiotic Product (ALIGN) 4 MG CAPS Take 4 mg by mouth daily.     ? psyllium (METAMUCIL SMOOTH TEXTURE) 28 % packet Take 1 packet by mouth 2 (two) times daily.    ? sodium chloride 1 g tablet Take 3 g by mouth 2 (two) times daily with a meal.    ? venlafaxine XR (EFFEXOR-XR) 75 MG 24 hr capsule TAKE 1 CAPSULE BY MOUTH DAILY WITH BREAKFAST. 90 capsule 2  ? ?No facility-administered medications prior to visit.  ? ? ? ?Review of Systems:  ? ?  Constitutional:   No  weight loss, night sweats,  Fevers, chills, fatigue, or  lassitude. ? ?HEENT:   No headaches,  Difficulty swallowing,  Tooth/dental problems, or  Sore throat,  ?              No sneezing, itching, ear ache, nasal congestion, post nasal drip,  ? ?CV:  No chest pain,  Orthopnea, PND, swelling in lower extremities, anasarca, dizziness, palpitations, syncope.  ? ?GI  No heartburn, indigestion, abdominal pain, nausea, vomiting, diarrhea, change in bowel habits, loss of appetite, bloody stools.  ? ?Resp: No shortness of breath with exertion or at rest.  No excess mucus, no productive cough,  No non-productive cough,  No coughing up of blood.  No change in color of mucus.  No wheezing.  No chest wall deformity ? ?Skin: no rash or lesions. ? ?GU: no dysuria, change in color of urine, no urgency or frequency.  No flank pain, no hematuria  ? ?MS:  No joint pain or swelling.  No decreased range of motion.  No back pain. ? ? ? ?Physical Exam ? ?BP 110/60 (BP Location: Left Arm, Patient Position: Sitting, Cuff Size: Large)   Pulse 76   Temp 99.7 ?F (37.6 ?C) (Oral)   Ht 5' 6" (1.676 m)   Wt 201 lb 9.6 oz (91.4 kg)   LMP  (LMP Unknown)   SpO2 98%   BMI 32.54 kg/m?  ? ?GEN: A/Ox3; pleasant , NAD, well nourished  ?  ?HEENT:  [place]/AT,  EACs-clear, TMs-wnl, NOSE-clear, THROAT-clear, no lesions, no postnasal drip or exudate noted.  ? ?NECK:  Supple w/ fair ROM; no JVD; normal  carotid impulses w/o bruits; no thyromegaly or nodules palpated; no lymphadenopathy.   ? ?RESP  Clear  P & A; w/o, wheezes/ rales/ or rhonchi. no accessory muscle use, no dullness to percussion ? ?CARD:  RRR, no

## 2021-10-30 DIAGNOSIS — M5416 Radiculopathy, lumbar region: Secondary | ICD-10-CM | POA: Diagnosis not present

## 2021-10-31 ENCOUNTER — Other Ambulatory Visit: Payer: Self-pay | Admitting: Family Medicine

## 2021-11-01 ENCOUNTER — Ambulatory Visit (INDEPENDENT_AMBULATORY_CARE_PROVIDER_SITE_OTHER): Payer: Medicare PPO | Admitting: Physician Assistant

## 2021-11-01 NOTE — Assessment & Plan Note (Signed)
Healthy weight loss discussed 

## 2021-11-01 NOTE — Assessment & Plan Note (Signed)
Moderate obstructive sleep apnea.  Patient is intolerant to CPAP.  Patient has significant symptom burden.  Will refer over for evaluation of inspire device. ?She has multiple comorbidities including diabetes, hypertension and hyperlipidemia.  Previous TIA.  She needs better control of her sleep apnea. ? ?Plan  ?Patient Instructions  ?Refer to ENT for INSPIRE Device evaluation  ?Do not drive if sleepy .  ?Work on healthy weight  ?Follow up with Dr. Halford Chessman  Or Markham Dumlao in 3-4  and As needed   ? ?  ? ?

## 2021-11-01 NOTE — Progress Notes (Signed)
Reviewed and agree with assessment/plan. ? ? ?Chesley Mires, MD ?[place] ?11/01/2021, 12:49 PM ?Pager:  [phone] ? ?

## 2021-11-08 ENCOUNTER — Ambulatory Visit (INDEPENDENT_AMBULATORY_CARE_PROVIDER_SITE_OTHER): Payer: Medicare PPO | Admitting: Family Medicine

## 2021-11-08 ENCOUNTER — Encounter (INDEPENDENT_AMBULATORY_CARE_PROVIDER_SITE_OTHER): Payer: Self-pay | Admitting: Family Medicine

## 2021-11-08 VITALS — BP 145/70 | HR 64 | Temp 98.0°F | Ht 66.0 in | Wt 195.0 lb

## 2021-11-08 DIAGNOSIS — Z6831 Body mass index (BMI) 31.0-31.9, adult: Secondary | ICD-10-CM

## 2021-11-08 DIAGNOSIS — E1169 Type 2 diabetes mellitus with other specified complication: Secondary | ICD-10-CM | POA: Diagnosis not present

## 2021-11-08 DIAGNOSIS — E559 Vitamin D deficiency, unspecified: Secondary | ICD-10-CM | POA: Diagnosis not present

## 2021-11-08 DIAGNOSIS — E669 Obesity, unspecified: Secondary | ICD-10-CM | POA: Diagnosis not present

## 2021-11-08 DIAGNOSIS — Z7984 Long term (current) use of oral hypoglycemic drugs: Secondary | ICD-10-CM | POA: Diagnosis not present

## 2021-11-12 DIAGNOSIS — M5416 Radiculopathy, lumbar region: Secondary | ICD-10-CM | POA: Diagnosis not present

## 2021-11-19 NOTE — Progress Notes (Unsigned)
Chief Complaint:   OBESITY [name] is here to discuss her progress with her obesity treatment plan along with follow-up of her obesity related diagnoses. [name] is on the Category 3 Plan and states she is following her eating plan approximately 70% of the time. [name] states she is not currently exercising.  Today's visit was #: 65 Starting weight: 220 lbs Starting date: 01/19/2018 Today's weight: 195 lbs Today's date: 11/08/2021 Total lbs lost to date: 25 lbs Total lbs lost since last in-office visit: 0 lbs  Interim History: This is [name]'s first office visit with me, as she is a patient of [name]'s PA-C. At her last office visit, 5 weeks ago, she has been doing more snacking and has been less active. She reports no issues with her meal plan and is getting in all her protein.  Subjective:   1. Type 2 diabetes mellitus with other specified complication, without long-term current use of insulin (HCC) [name] has an Musician that manages her diabetes. She was last seen at Endo on 09/17/2021. Blood sugar readings were 120's-130's with no lows or highs.  2. Vitamin D deficiency She is currently taking OTC vitamin D 5,000 each day.   Assessment/Plan:  No orders of the defined types were placed in this encounter.   There are no discontinued medications.   No orders of the defined types were placed in this encounter.    1. Type 2 diabetes mellitus with other specified complication, without long-term current use of insulin ([place]) [name] will continue taking Metformin. Counseling was done and handout on Metformin was given to her. She will consider an increase at next office visit if still snacking more.  2. Vitamin D deficiency [name] will continue taking her supplement. She was reminded to get a bone density scan every 2 yrs and to do weight bearing exercises.  3. Obesity, Current BMI 31.5 [name] is currently in the action stage of  change. As such, her goal is to continue with weight loss efforts. She has agreed to the Category 3 Plan.   [name] will repeat the IC test, her last RMR was on 03/2020. She will me mindful of snack calories. Her goal is to start water aerobics 3 days a week. Exercise goals: Geriatric exercise  Behavioral modification strategies: better snacking choices and avoiding temptations.  [name] has agreed to follow-up with our clinic in 3-4 weeks fasting for repeat IC and 30 minutes prior to appointment time. [name] She was informed of the importance of frequent follow-up visits to maximize her success with intensive lifestyle modifications for her multiple health conditions.   Objective:   Blood pressure (!) 145/70, pulse 64, temperature 98 F (36.7 C), height '5\' 6"'$  (1.676 m), weight 195 lb (88.5 kg), SpO2 99 %. Body mass index is 31.47 kg/m.  General: Cooperative, alert, well developed, in no acute distress. HEENT: Conjunctivae and lids unremarkable. Cardiovascular: Regular rhythm.  Lungs: Normal work of breathing. Neurologic: No focal deficits.   Lab Results  Component Value Date   CREATININE 0.64 09/17/2021   BUN 22 09/17/2021   NA 130 (L) 09/17/2021   K 5.0 09/17/2021   CL 98 09/17/2021   CO2 25 09/17/2021   Lab Results  Component Value Date   ALT 15 06/05/2021   AST 27 06/05/2021   ALKPHOS 251 (H) 06/05/2021   BILITOT 0.4 06/05/2021   Lab Results  Component Value Date   HGBA1C 6.3 09/24/2021   HGBA1C 6.6 (H) 06/05/2021   HGBA1C 6.5 (  H) 02/09/2021   HGBA1C 6.3 (H) 01/25/2021   HGBA1C 5.7 (H) 04/20/2020   Lab Results  Component Value Date   INSULIN 12.9 06/05/2021   INSULIN 7.7 01/25/2021   INSULIN 13.1 04/20/2020   INSULIN 12.1 01/19/2018   Lab Results  Component Value Date   TSH 4.23 09/17/2021   Lab Results  Component Value Date   CHOL 144 06/05/2021   HDL 64 06/05/2021   LDLCALC 65 06/05/2021   LDLDIRECT 58.0 11/25/2017   TRIG 75 06/05/2021   CHOLHDL  2.3 06/05/2021   Lab Results  Component Value Date   VD25OH 70.1 06/05/2021   VD25OH 74.5 01/25/2021   VD25OH 66.5 04/20/2020   Lab Results  Component Value Date   WBC 7.5 09/24/2021   HGB 12.0 09/24/2021   HCT 36.7 09/24/2021   MCV 83.4 09/24/2021   PLT 309.0 09/24/2021   No results found for: IRON, TIBC, FERRITIN  Obesity Behavioral Intervention:   Approximately 15 minutes were spent on the discussion below.  ASK: We discussed the diagnosis of obesity with [name] today and [name] agreed to give [name] permission to discuss obesity behavioral modification therapy today.  ASSESS: Sweetie has the diagnosis of obesity and her BMI today is 31.5. Jasiel is in the action stage of change.   ADVISE: [name] was educated on the multiple health risks of obesity as well as the benefit of weight loss to improve her health. She was advised of the need for long term treatment and the importance of lifestyle modifications to improve her current health and to decrease her risk of future health problems.  AGREE: Multiple dietary modification options and treatment options were discussed and [name] agreed to follow the recommendations documented in the above note.  ARRANGE: [name] was educated on the importance of frequent visits to treat obesity as outlined per CMS and USPSTF guidelines and agreed to schedule her next follow up appointment today.  Attestation Statements:   Reviewed by clinician on day of visit: allergies, medications, problem list, medical history, surgical history, family history, social history, and previous encounter notes.  ILennette Bihari, CMA, am acting as transcriptionist for Dr. Raliegh Scarlet, DO.  I have reviewed the above documentation for accuracy and completeness, and I agree with the above. Marjory Sneddon, D.O.  The [place] was signed into law in 2016 which includes the topic of electronic health records.  This provides  immediate access to information in MyChart.  This includes consultation notes, operative notes, office notes, lab results and pathology reports.  If you have any questions about what you read please let us know at your next visit so we can discuss your concerns and take corrective action if need be.  We are right here with you.

## 2021-11-20 ENCOUNTER — Other Ambulatory Visit: Payer: Self-pay | Admitting: Family Medicine

## 2021-11-26 ENCOUNTER — Ambulatory Visit: Payer: Medicare PPO | Admitting: Adult Health

## 2021-11-28 ENCOUNTER — Other Ambulatory Visit: Payer: Self-pay | Admitting: Family Medicine

## 2021-11-29 DIAGNOSIS — L910 Hypertrophic scar: Secondary | ICD-10-CM | POA: Diagnosis not present

## 2021-12-06 ENCOUNTER — Encounter (INDEPENDENT_AMBULATORY_CARE_PROVIDER_SITE_OTHER): Payer: Self-pay | Admitting: Adult Health

## 2021-12-06 ENCOUNTER — Ambulatory Visit (INDEPENDENT_AMBULATORY_CARE_PROVIDER_SITE_OTHER): Payer: Medicare PPO | Admitting: Adult Health

## 2021-12-06 VITALS — BP 109/67 | HR 69 | Temp 98.6°F | Ht 66.0 in | Wt 193.0 lb

## 2021-12-06 DIAGNOSIS — R0602 Shortness of breath: Secondary | ICD-10-CM

## 2021-12-06 DIAGNOSIS — Z6831 Body mass index (BMI) 31.0-31.9, adult: Secondary | ICD-10-CM

## 2021-12-06 DIAGNOSIS — E669 Obesity, unspecified: Secondary | ICD-10-CM

## 2021-12-10 DIAGNOSIS — R0602 Shortness of breath: Secondary | ICD-10-CM | POA: Insufficient documentation

## 2021-12-10 NOTE — Progress Notes (Signed)
Chief Complaint:   OBESITY [name] is here to discuss her progress with her obesity treatment plan along with follow-up of her obesity related diagnoses. [name] is on the Category 3 Plan and states she is following her eating plan approximately 80% of the time. [name] states she is walking 15 minutes 5 times per week.  Today's visit was #: 22 Starting weight: 220 lbs Starting date: 01/19/2018 Today's weight: 193 lbs Today's date: 12/06/2021 Total lbs lost to date: 27 lbs Total lbs lost since last in-office visit: 2  Interim History:  [name] is pleased to have lost, despite week long celebration for her birthday.  She enjoyed Carrot cake, Key lime pie and cheesecake. .   Subjective:   1. SOB (shortness of breath) She reports dyspnea with extreme exertion, denies CP with extreme exertion.  Assessment/Plan:   1. SOB (shortness of breath) 01/19/18 RMR of 2122 04/01/19 RMR of 1782 04/20/20 RMR of 2428 today RMR of 1440- [name] reports decrease in activity due to back pain.   [name] will change from Cat 3 to Cat 2 meal plan to account for decreased metabolism.   2. Obesity, Current BMI 31.2 [name] is currently in the action stage of change. As such, her goal is to continue with weight loss efforts. She has agreed to the Category 2 Plan.   [name] will change from Cat 3 to Cat 2 meal plan to account for decreased metabolism.   Exercise goals: Walk aqua fit, 1-2 times a week.  Behavioral modification strategies: increasing lean protein intake, decreasing simple carbohydrates, meal planning and cooking strategies, keeping healthy foods in the home, and planning for success.  [name] has agreed to follow-up with our clinic in 4 weeks. She was informed of the importance of frequent follow-up visits to maximize her success with intensive lifestyle modifications for her multiple health conditions.   Objective:   Blood pressure 109/67, pulse 69,  temperature 98.6 F (37 C), height '5\' 6"'$  (1.676 m), weight 193 lb (87.5 kg), SpO2 99 %. Body mass index is 31.15 kg/m.  General: Cooperative, alert, well developed, in no acute distress. HEENT: Conjunctivae and lids unremarkable. Cardiovascular: Regular rhythm.  Lungs: Normal work of breathing. Neurologic: No focal deficits.   Lab Results  Component Value Date   CREATININE 0.64 09/17/2021   BUN 22 09/17/2021   NA 130 (L) 09/17/2021   K 5.0 09/17/2021   CL 98 09/17/2021   CO2 25 09/17/2021   Lab Results  Component Value Date   ALT 15 06/05/2021   AST 27 06/05/2021   ALKPHOS 251 (H) 06/05/2021   BILITOT 0.4 06/05/2021   Lab Results  Component Value Date   HGBA1C 6.3 09/24/2021   HGBA1C 6.6 (H) 06/05/2021   HGBA1C 6.5 (H) 02/09/2021   HGBA1C 6.3 (H) 01/25/2021   HGBA1C 5.7 (H) 04/20/2020   Lab Results  Component Value Date   INSULIN 12.9 06/05/2021   INSULIN 7.7 01/25/2021   INSULIN 13.1 04/20/2020   INSULIN 12.1 01/19/2018   Lab Results  Component Value Date   TSH 4.23 09/17/2021   Lab Results  Component Value Date   CHOL 144 06/05/2021   HDL 64 06/05/2021   LDLCALC 65 06/05/2021   LDLDIRECT 58.0 11/25/2017   TRIG 75 06/05/2021   CHOLHDL 2.3 06/05/2021   Lab Results  Component Value Date   VD25OH 70.1 06/05/2021   VD25OH 74.5 01/25/2021   VD25OH 66.5 04/20/2020   Lab Results  Component Value Date  WBC 7.5 09/24/2021   HGB 12.0 09/24/2021   HCT 36.7 09/24/2021   MCV 83.4 09/24/2021   PLT 309.0 09/24/2021   No results found for: "IRON", "TIBC", "FERRITIN"  Obesity Behavioral Intervention:   Approximately 15 minutes were spent on the discussion below.  ASK: We discussed the diagnosis of obesity with [name] today and [name] agreed to give [name] permission to discuss obesity behavioral modification therapy today.  ASSESS: [name] has the diagnosis of obesity and her BMI today is 31.2. [name] is in the action stage of change.    ADVISE: [name] was educated on the multiple health risks of obesity as well as the benefit of weight loss to improve her health. She was advised of the need for long term treatment and the importance of lifestyle modifications to improve her current health and to decrease her risk of future health problems.  AGREE: Multiple dietary modification options and treatment options were discussed and [name] agreed to follow the recommendations documented in the above note.  ARRANGE: [name] was educated on the importance of frequent visits to treat obesity as outlined per CMS and USPSTF guidelines and agreed to schedule her next follow up appointment today.  Attestation Statements:   Reviewed by clinician on day of visit: allergies, medications, problem list, medical history, surgical history, family history, social history, and previous encounter notes.  I, Brendell Tyus, RMA, am acting as transcriptionist for Mina Marble, NP.  I have reviewed the above documentation for accuracy and completeness, and I agree with the above. -  Khamiya Varin d. Aarica Wax, NP-C

## 2021-12-17 ENCOUNTER — Other Ambulatory Visit: Payer: Self-pay | Admitting: Family Medicine

## 2022-01-01 ENCOUNTER — Encounter: Payer: Medicare PPO | Admitting: Family Medicine

## 2022-01-03 DIAGNOSIS — L910 Hypertrophic scar: Secondary | ICD-10-CM | POA: Diagnosis not present

## 2022-01-08 ENCOUNTER — Encounter (INDEPENDENT_AMBULATORY_CARE_PROVIDER_SITE_OTHER): Payer: Self-pay | Admitting: Adult Health

## 2022-01-08 ENCOUNTER — Ambulatory Visit (INDEPENDENT_AMBULATORY_CARE_PROVIDER_SITE_OTHER): Payer: Medicare PPO | Admitting: Adult Health

## 2022-01-08 VITALS — BP 148/80 | HR 65 | Temp 98.9°F | Ht 66.0 in | Wt 194.0 lb

## 2022-01-08 DIAGNOSIS — Z7985 Long-term (current) use of injectable non-insulin antidiabetic drugs: Secondary | ICD-10-CM | POA: Diagnosis not present

## 2022-01-08 DIAGNOSIS — E669 Obesity, unspecified: Secondary | ICD-10-CM

## 2022-01-08 DIAGNOSIS — E1159 Type 2 diabetes mellitus with other circulatory complications: Secondary | ICD-10-CM

## 2022-01-08 DIAGNOSIS — Z6831 Body mass index (BMI) 31.0-31.9, adult: Secondary | ICD-10-CM | POA: Diagnosis not present

## 2022-01-08 DIAGNOSIS — E1169 Type 2 diabetes mellitus with other specified complication: Secondary | ICD-10-CM | POA: Diagnosis not present

## 2022-01-08 DIAGNOSIS — I152 Hypertension secondary to endocrine disorders: Secondary | ICD-10-CM | POA: Diagnosis not present

## 2022-01-09 NOTE — Progress Notes (Signed)
Chief Complaint:   OBESITY [name] is here to discuss her progress with her obesity treatment plan along with follow-up of her obesity related diagnoses. [name] is on the Category 2 Plan and states she is following her eating plan approximately 70% of the time. [name] states she is doing 0 minutes 0 times per week.  Today's visit was #: 12 Starting weight: 220 lbs Starting date: 01/19/2018 Today's weight: 194 lbs Today's date: 01/08/2022 Total lbs lost to date: 26 Total lbs lost since last in-office visit: 0  Interim History:  [name] recently celebrated her 61th wedding anniversary-enjoyed a dinner at [employer].   12/06/2021  IC completed: revealed RMR of 1440- reduced metabolism from last IC check. Her prescribed meal plan was changed from category 3 to category 2 at her last office visit on   Bioimpedance results were discussed with the patient:  Muscle mass + 1.2 lbs Adipose mass + 0.2 lb  Water weight +0.6 lb  Subjective:   1. Type 2 diabetes mellitus with other specified complication, without long-term current use of insulin (HCC) [name] is only on metformin 500 mg daily with breakfast, and is managed by her PCP.   She was briefly on Ozempic, but unable to tolerate due to constipation.   Morning Metamucil produces morning BM.   1 dose of Ozempic 0.25 mg cause 7 days of constipation.  2. Hypertension associated with type 2 diabetes mellitus ([place]) [name]'s hypertension is managed by hypertension clinic. She reports taking all of her antihypertensive therapy last night.   amLODipine (NORVASC) 10 MG tablet    Take 1 tablet (10 mg total) by mouth daily    cloNIDine (CATAPRES) 0.1 MG tablet    Take 1 tablet (0.1 mg total) by mouth at bedtime    atorvastatin (LIPITOR) 40 MG tablet    TAKE 1 TABLET BY MOUTH DAILY AT 6 PM    irbesartan (AVAPRO) 300 MG tablet    Take 1 tablet (300 mg total) by mouth daily    Assessment/Plan:   1. Type 2 diabetes  mellitus with other specified complication, without long-term current use of insulin (HCC) [name] will continue her daily metformin per her PCP.  2. Hypertension associated with type 2 diabetes mellitus ([place]) [name] will continue her antihypertensive therapy per hypertension clinic.  3. Obesity, current BMI 31.4 [name] is currently in the action stage of change. As such, her goal is to continue with weight loss efforts. She has agreed to the Category 2 Plan.   Use food scale when measuring protein.  Exercise goals: Older adults should follow the adult guidelines. When older adults cannot meet the adult guidelines, they should be as physically active as their abilities and conditions will allow.   Behavioral modification strategies: increasing lean protein intake, decreasing simple carbohydrates, meal planning and cooking strategies, keeping healthy foods in the home, and planning for success.  [name] has agreed to follow-up with our clinic in 4 weeks. She was informed of the importance of frequent follow-up visits to maximize her success with intensive lifestyle modifications for her multiple health conditions.   Objective:   Blood pressure (!) 148/80, pulse 65, temperature 98.9 F (37.2 C), height '5\' 6"'$  (1.676 m), weight 194 lb (88 kg), SpO2 98 %. Body mass index is 31.31 kg/m.  General: Cooperative, alert, well developed, in no acute distress. HEENT: Conjunctivae and lids unremarkable. Cardiovascular: Regular rhythm.  Lungs: Normal work of breathing. Neurologic: No focal deficits.   Lab Results  Component Value  Date   CREATININE 0.64 09/17/2021   BUN 22 09/17/2021   NA 130 (L) 09/17/2021   K 5.0 09/17/2021   CL 98 09/17/2021   CO2 25 09/17/2021   Lab Results  Component Value Date   ALT 15 06/05/2021   AST 27 06/05/2021   ALKPHOS 251 (H) 06/05/2021   BILITOT 0.4 06/05/2021   Lab Results  Component Value Date   HGBA1C 6.3 09/24/2021   HGBA1C 6.6 (H)  06/05/2021   HGBA1C 6.5 (H) 02/09/2021   HGBA1C 6.3 (H) 01/25/2021   HGBA1C 5.7 (H) 04/20/2020   Lab Results  Component Value Date   INSULIN 12.9 06/05/2021   INSULIN 7.7 01/25/2021   INSULIN 13.1 04/20/2020   INSULIN 12.1 01/19/2018   Lab Results  Component Value Date   TSH 4.23 09/17/2021   Lab Results  Component Value Date   CHOL 144 06/05/2021   HDL 64 06/05/2021   LDLCALC 65 06/05/2021   LDLDIRECT 58.0 11/25/2017   TRIG 75 06/05/2021   CHOLHDL 2.3 06/05/2021   Lab Results  Component Value Date   VD25OH 70.1 06/05/2021   VD25OH 74.5 01/25/2021   VD25OH 66.5 04/20/2020   Lab Results  Component Value Date   WBC 7.5 09/24/2021   HGB 12.0 09/24/2021   HCT 36.7 09/24/2021   MCV 83.4 09/24/2021   PLT 309.0 09/24/2021   No results found for: "IRON", "TIBC", "FERRITIN"  Attestation Statements:   Reviewed by clinician on day of visit: allergies, medications, problem list, medical history, surgical history, family history, social history, and previous encounter notes.  Time spent on visit including pre-visit chart review and post-visit care and charting was 28 minutes.   Wilhemena Durie, am acting as transcriptionist for Mina Marble, NP.  I have reviewed the above documentation for accuracy and completeness, and I agree with the above. -  [name] Eichel d. Trong Gosling, NP-C

## 2022-01-15 DIAGNOSIS — G4733 Obstructive sleep apnea (adult) (pediatric): Secondary | ICD-10-CM | POA: Diagnosis not present

## 2022-01-16 ENCOUNTER — Other Ambulatory Visit: Payer: Self-pay | Admitting: Otolaryngology

## 2022-01-23 DIAGNOSIS — S42202D Unspecified fracture of upper end of left humerus, subsequent encounter for fracture with routine healing: Secondary | ICD-10-CM | POA: Diagnosis not present

## 2022-01-30 ENCOUNTER — Encounter (INDEPENDENT_AMBULATORY_CARE_PROVIDER_SITE_OTHER): Payer: Self-pay

## 2022-02-05 ENCOUNTER — Encounter (INDEPENDENT_AMBULATORY_CARE_PROVIDER_SITE_OTHER): Payer: Self-pay | Admitting: Adult Health

## 2022-02-05 ENCOUNTER — Ambulatory Visit (INDEPENDENT_AMBULATORY_CARE_PROVIDER_SITE_OTHER): Payer: Medicare PPO | Admitting: Adult Health

## 2022-02-05 VITALS — BP 128/66 | HR 64 | Temp 98.2°F | Ht 66.0 in | Wt 194.0 lb

## 2022-02-05 DIAGNOSIS — E1169 Type 2 diabetes mellitus with other specified complication: Secondary | ICD-10-CM

## 2022-02-05 DIAGNOSIS — E669 Obesity, unspecified: Secondary | ICD-10-CM | POA: Diagnosis not present

## 2022-02-05 DIAGNOSIS — Z6831 Body mass index (BMI) 31.0-31.9, adult: Secondary | ICD-10-CM

## 2022-02-05 DIAGNOSIS — Z7984 Long term (current) use of oral hypoglycemic drugs: Secondary | ICD-10-CM

## 2022-02-11 ENCOUNTER — Other Ambulatory Visit: Payer: Self-pay | Admitting: Family Medicine

## 2022-02-11 NOTE — Progress Notes (Unsigned)
Chief Complaint:   OBESITY [name] is here to discuss her progress with her obesity treatment plan along with follow-up of her obesity related diagnoses. [name] is on the Category 2 Plan and states she is following her eating plan approximately 70% of the time. [name] states not currently exercising due to back pain.  Today's visit was #: 20 Starting weight: 220 lbs Starting date: 01/19/2018 Today's weight: 194 lbs Today's date: 02/05/22 Total lbs lost to date: 26 Total lbs lost since last in-office visit: 0  Interim History: She endorses polyphagia late afternoon. Of note- [facility] 06/01/2019. ***  Subjective:   1. Type 2 diabetes mellitus with other specified complication, without long-term current use of insulin ([place]) 06/05/2021 A1c 6.6.  09/24/2021 A1c 6.3. Currently on metformin 500 mg-recently decreased due to weight loss.  Ozempic-significantly worsened constipation.  Assessment/Plan:   1. Type 2 diabetes mellitus with other specified complication, without long-term current use of insulin (HCC) Continue metformin per PCP.  2. Obesity, current BMI 31.3 Fasting labs at next office visit.  [name] is currently in the action stage of change. As such, her goal is to continue with weight loss efforts. She has agreed to the Category 2 Plan.   Exercise goals: Seated exercises as tolerated.  Behavioral modification strategies: increasing lean protein intake, decreasing simple carbohydrates, meal planning and cooking strategies, keeping healthy foods in the home, and planning for success.  [name] has agreed to follow-up with our clinic in 4 weeks, fasting. She was informed of the importance of frequent follow-up visits to maximize her success with intensive lifestyle modifications for her multiple health conditions.   Objective:   Blood pressure 128/66, pulse 64, temperature 98.2 F (36.8 C), height '5\' 6"'$  (1.676 m), weight 194 lb (88 kg),  SpO2 98 %. Body mass index is 31.31 kg/m.  General: Cooperative, alert, well developed, in no acute distress. HEENT: Conjunctivae and lids unremarkable. Cardiovascular: Regular rhythm.  Lungs: Normal work of breathing. Neurologic: No focal deficits.   Lab Results  Component Value Date   CREATININE 0.64 09/17/2021   BUN 22 09/17/2021   NA 130 (L) 09/17/2021   K 5.0 09/17/2021   CL 98 09/17/2021   CO2 25 09/17/2021   Lab Results  Component Value Date   ALT 15 06/05/2021   AST 27 06/05/2021   ALKPHOS 251 (H) 06/05/2021   BILITOT 0.4 06/05/2021   Lab Results  Component Value Date   HGBA1C 6.3 09/24/2021   HGBA1C 6.6 (H) 06/05/2021   HGBA1C 6.5 (H) 02/09/2021   HGBA1C 6.3 (H) 01/25/2021   HGBA1C 5.7 (H) 04/20/2020   Lab Results  Component Value Date   INSULIN 12.9 06/05/2021   INSULIN 7.7 01/25/2021   INSULIN 13.1 04/20/2020   INSULIN 12.1 01/19/2018   Lab Results  Component Value Date   TSH 4.23 09/17/2021   Lab Results  Component Value Date   CHOL 144 06/05/2021   HDL 64 06/05/2021   LDLCALC 65 06/05/2021   LDLDIRECT 58.0 11/25/2017   TRIG 75 06/05/2021   CHOLHDL 2.3 06/05/2021   Lab Results  Component Value Date   VD25OH 70.1 06/05/2021   VD25OH 74.5 01/25/2021   VD25OH 66.5 04/20/2020   Lab Results  Component Value Date   WBC 7.5 09/24/2021   HGB 12.0 09/24/2021   HCT 36.7 09/24/2021   MCV 83.4 09/24/2021   PLT 309.0 09/24/2021   No results found for: "IRON", "TIBC", "FERRITIN"   Attestation Statements:  Reviewed by clinician on day of visit: allergies, medications, problem list, medical history, surgical history, family history, social history, and previous encounter notes.  I, Georgianne Fick, FNP, am acting as Location manager for Mina Marble, NP.  I have reviewed the above documentation for accuracy and completeness, and I agree with the above. -  ***

## 2022-02-12 DIAGNOSIS — M5416 Radiculopathy, lumbar region: Secondary | ICD-10-CM | POA: Diagnosis not present

## 2022-02-13 ENCOUNTER — Encounter (HOSPITAL_BASED_OUTPATIENT_CLINIC_OR_DEPARTMENT_OTHER): Payer: Self-pay | Admitting: Otolaryngology

## 2022-02-13 ENCOUNTER — Other Ambulatory Visit: Payer: Self-pay

## 2022-02-13 ENCOUNTER — Telehealth: Payer: Self-pay | Admitting: Family Medicine

## 2022-02-13 NOTE — Progress Notes (Signed)
Patient states Dr Garret Reddish writes for her Plavix and he told her she could stop 5d prior to surgery but to take an '81mg'$  ASA during that time. I called Dr Ansel Bong office to get a confirmation on this request.

## 2022-02-13 NOTE — Telephone Encounter (Signed)
See below

## 2022-02-13 NOTE — Telephone Encounter (Signed)
Caller states: -Patient has a surgery scheduled for 02/19/22. Pt is currently on plavix - She was informed by patient that PCP told her she could stop taking her plavix 5 days prior to surgery and take baby aspirin in interim.  Caller requests: - Confirmation of PCP instructions via Epic telephone note   Tammy can be reached at [phone] for further questions

## 2022-02-13 NOTE — Telephone Encounter (Signed)
Yes thanks- agree with plan as listed

## 2022-02-14 ENCOUNTER — Encounter (HOSPITAL_BASED_OUTPATIENT_CLINIC_OR_DEPARTMENT_OTHER)
Admission: RE | Admit: 2022-02-14 | Discharge: 2022-02-14 | Disposition: A | Discharge status: Home / Self Care | Payer: Medicare PPO | Source: Ambulatory Visit | Attending: Otolaryngology | Admitting: Otolaryngology

## 2022-02-14 DIAGNOSIS — E119 Type 2 diabetes mellitus without complications: Secondary | ICD-10-CM | POA: Diagnosis not present

## 2022-02-14 DIAGNOSIS — Z01818 Encounter for other preprocedural examination: Secondary | ICD-10-CM | POA: Insufficient documentation

## 2022-02-14 DIAGNOSIS — R001 Bradycardia, unspecified: Secondary | ICD-10-CM | POA: Insufficient documentation

## 2022-02-14 DIAGNOSIS — R748 Abnormal levels of other serum enzymes: Secondary | ICD-10-CM | POA: Diagnosis not present

## 2022-02-14 DIAGNOSIS — R9431 Abnormal electrocardiogram [ECG] [EKG]: Secondary | ICD-10-CM | POA: Diagnosis not present

## 2022-02-14 DIAGNOSIS — L910 Hypertrophic scar: Secondary | ICD-10-CM | POA: Diagnosis not present

## 2022-02-14 LAB — BASIC METABOLIC PANEL
Anion gap: 8 (ref 5–15)
BUN: 17 mg/dL (ref 8–23)
CO2: 25 mmol/L (ref 22–32)
Calcium: 9.4 mg/dL (ref 8.9–10.3)
Chloride: 102 mmol/L (ref 98–111)
Creatinine, Ser: 0.58 mg/dL (ref 0.44–1.00)
GFR, Estimated: >60 mL/min (ref >60)
Glucose, Bld: 99 mg/dL (ref 70–99)
Potassium: 4.8 mmol/L (ref 3.5–5.1)
Sodium: 135 mmol/L (ref 135–145)

## 2022-02-14 NOTE — Telephone Encounter (Signed)
Hi Tammy, see Dr. Ansel Bong response below.

## 2022-02-19 ENCOUNTER — Ambulatory Visit (HOSPITAL_BASED_OUTPATIENT_CLINIC_OR_DEPARTMENT_OTHER): Payer: Medicare PPO | Admitting: Certified Registered"

## 2022-02-19 ENCOUNTER — Ambulatory Visit (HOSPITAL_BASED_OUTPATIENT_CLINIC_OR_DEPARTMENT_OTHER)
Admission: RE | Admit: 2022-02-19 | Discharge: 2022-02-19 | Disposition: A | Discharge status: Home / Self Care | Payer: Medicare PPO | Attending: Otolaryngology | Admitting: Otolaryngology

## 2022-02-19 ENCOUNTER — Encounter (HOSPITAL_BASED_OUTPATIENT_CLINIC_OR_DEPARTMENT_OTHER): Payer: Self-pay | Admitting: Otolaryngology

## 2022-02-19 ENCOUNTER — Encounter (HOSPITAL_BASED_OUTPATIENT_CLINIC_OR_DEPARTMENT_OTHER)
Admission: RE | Disposition: A | Discharge status: Home / Self Care | Payer: Self-pay | Source: Home / Self Care | Attending: Otolaryngology

## 2022-02-19 ENCOUNTER — Other Ambulatory Visit: Payer: Self-pay

## 2022-02-19 DIAGNOSIS — I10 Essential (primary) hypertension: Secondary | ICD-10-CM

## 2022-02-19 DIAGNOSIS — E119 Type 2 diabetes mellitus without complications: Secondary | ICD-10-CM | POA: Diagnosis not present

## 2022-02-19 DIAGNOSIS — K219 Gastro-esophageal reflux disease without esophagitis: Secondary | ICD-10-CM | POA: Diagnosis not present

## 2022-02-19 DIAGNOSIS — Z7984 Long term (current) use of oral hypoglycemic drugs: Secondary | ICD-10-CM

## 2022-02-19 DIAGNOSIS — G4733 Obstructive sleep apnea (adult) (pediatric): Secondary | ICD-10-CM

## 2022-02-19 DIAGNOSIS — J45909 Unspecified asthma, uncomplicated: Secondary | ICD-10-CM | POA: Diagnosis not present

## 2022-02-19 DIAGNOSIS — F419 Anxiety disorder, unspecified: Secondary | ICD-10-CM

## 2022-02-19 DIAGNOSIS — R748 Abnormal levels of other serum enzymes: Secondary | ICD-10-CM

## 2022-02-19 HISTORY — PX: DRUG INDUCED ENDOSCOPY: SHX6808

## 2022-02-19 LAB — GLUCOSE, CAPILLARY
Glucose-Capillary: 116 mg/dL — ABNORMAL HIGH (ref 70–99)
Glucose-Capillary: 126 mg/dL — ABNORMAL HIGH (ref 70–99)

## 2022-02-19 SURGERY — DRUG INDUCED SLEEP ENDOSCOPY
Anesthesia: Monitor Anesthesia Care | Site: Nose

## 2022-02-19 MED ORDER — LIDOCAINE 2% (20 MG/ML) 5 ML SYRINGE
INTRAMUSCULAR | Status: DC | PRN
  Administered 2022-02-19: 60 mg via INTRAVENOUS

## 2022-02-19 MED ORDER — PROPOFOL 500 MG/50ML IV EMUL
INTRAVENOUS | Status: DC | PRN
  Administered 2022-02-19: 35 ug/kg/min via INTRAVENOUS

## 2022-02-19 MED ORDER — LACTATED RINGERS IV SOLN
INTRAVENOUS | Status: DC
  Administered 2022-02-19: 10 mL/h via INTRAVENOUS

## 2022-02-19 MED ORDER — ONDANSETRON HCL 4 MG/2ML IJ SOLN: 4.0000 mg | Freq: Four times a day (QID) | INTRAMUSCULAR | Status: DC | PRN

## 2022-02-19 MED ORDER — FENTANYL CITRATE (PF) 100 MCG/2ML IJ SOLN: 25.0000 ug | INTRAMUSCULAR | Status: DC | PRN

## 2022-02-19 MED ORDER — OXYCODONE HCL 5 MG PO TABS: 5.0000 mg | ORAL_TABLET | Freq: Once | ORAL | Status: DC | PRN

## 2022-02-19 MED ORDER — OXYCODONE HCL 5 MG/5ML PO SOLN: 5.0000 mg | Freq: Once | ORAL | Status: DC | PRN

## 2022-02-19 SURGICAL SUPPLY — 13 items
CANISTER SUCT 1200ML W/VALVE (MISCELLANEOUS) ×1 IMPLANT
GLOVE BIOGEL M 7.0 STRL (GLOVE) ×1 IMPLANT
KIT CLEAN ENDO (MISCELLANEOUS) ×1 IMPLANT
NDL HYPO 27GX1-1/4 (NEEDLE) IMPLANT
NEEDLE HYPO 27GX1-1/4 (NEEDLE) IMPLANT
PATTIES SURGICAL .5 X3 (DISPOSABLE) IMPLANT
SHEET MEDIUM DRAPE 40X70 STRL (DRAPES) ×1 IMPLANT
SOL ANTI FOG 6CC (MISCELLANEOUS) ×1 IMPLANT
SOLUTION ANTI FOG 6CC (MISCELLANEOUS) ×1
SPONGE NEURO XRAY DETECT 1X3 (DISPOSABLE) IMPLANT
SYR CONTROL 10ML LL (SYRINGE) IMPLANT
TOWEL GREEN STERILE FF (TOWEL DISPOSABLE) ×1 IMPLANT
TUBE CONNECTING 20X1/4 (TUBING) IMPLANT

## 2022-02-19 NOTE — Transfer of Care (Signed)
Immediate Anesthesia Transfer of Care Note  Patient: [name]  Procedure(s) Performed: DRUG INDUCED SLEEP ENDOSCOPY (Nose)  Patient Location: PACU  Anesthesia Type:MAC  Level of Consciousness: awake, alert , oriented and patient cooperative  Airway & Oxygen Therapy: Patient Spontanous Breathing  Post-op Assessment: Report given to RN and Post -op Vital signs reviewed and stable  Post vital signs: Reviewed and stable  Last Vitals:  Vitals Value Taken Time  BP 141/60 02/19/22 0919  Temp    Pulse 116 02/19/22 0921  Resp 15 02/19/22 0921  SpO2 96 % 02/19/22 0921  Vitals shown include unvalidated device data.  Last Pain:  Vitals:   02/19/22 0752  TempSrc: Oral  PainSc: 0-No pain      Patients Stated Pain Goal: 5 (60/04/59 9774)  Complications: No notable events documented.

## 2022-02-19 NOTE — Op Note (Signed)
Operative Note: DRUG INDUCED SLEEP ENDOSCOPY  Patient: [name]  Medical record number: [id number]  Date:02/19/2022  Pre-operative Indications: 1.  Obstructive Sleep Apnea  Postoperative Indications: Same  Surgical Procedure: 1.  Drug Induced Sleep Endoscopy (DISE)  Anesthesia: MAC with IV sedation  Surgeon: Delsa Bern, M.D.  Complications: None  BMI: 31.6 kg/m  EBL: None  Findings: There is no evidence of complete concentric palatal obstruction.  Anatomically the patient should be a candidate for hypoglossal nerve stimulation therapy.     Brief History: The patient is a 71 y.o. female with a history of obstructive sleep apnea. The patient has undergone previous sleep study which showed mild to moderate levels of obstructive sleep apnea.  The patient was prescribed CPAP which they consistently attempted to use without success.  Given the patient's history and findings, the above drug-induced sleep endoscopy was recommended to assess the patient's anatomic level of apnea.  Risks and benefits were discussed in detail with the patient today understand and agree with our plan for surgery which is scheduled at [place] under sedated anesthesia as an outpatient.  Surgical Procedure: The patient is brought to the operating room on 02/19/2022 and placed in supine position on the operating table.  Intravenous sedated anesthesia was established without difficulty using the standard drug-induced sleep endoscopy protocol. When the patient was adequately anesthetized, surgical timeout was performed and correct identification of the patient and the surgical procedure.    A propofol infusion was administered and the patient was monitored carefully to achieve a level of sedation appropriate for DISE.  The patient did not respond to verbal commands but still had spontaneous respiration, sleep disordered breathing and associated desaturations were observed.  With the patient under  adequate sedated anesthesia the flexible nasal laryngoscope was passed without difficulty.  The patient's nasal cavity showed mild septal deviation.  The endoscope was then passed to visualize the velopharynx, oropharynx, tongue base and epiglottis to assess areas of obstruction.  Patient's airway showed anterior to posterior obstruction at the level of the palate and base of tongue.   There was no evidence of complete concentric palatal obstruction and the patient appeared to be a candidate anatomically for hypoglossal nerve stimulation therapy.  Surgical sponge count was correct. Patient was awakened from anesthetic and transferred from the operating room to the recovery room in stable condition. There were no complications and no blood loss.   Delsa Bern, M.D. [facility] 02/19/2022

## 2022-02-19 NOTE — Anesthesia Preprocedure Evaluation (Signed)
Anesthesia Evaluation  Patient identified by MRN, date of birth, ID band Patient awake    Reviewed: Allergy & Precautions, H&P , NPO status , Patient's Chart, lab work & pertinent test results  History of Anesthesia Complications (+) PONV and history of anesthetic complications  Airway Mallampati: II   Neck ROM: full    Dental   Pulmonary asthma , sleep apnea ,    breath sounds clear to auscultation       Cardiovascular hypertension, + Valvular Problems/Murmurs  Rhythm:regular Rate:Normal  S/p AVR   Neuro/Psych  Headaches, PSYCHIATRIC DISORDERS Anxiety TIA   GI/Hepatic GERD  ,  Endo/Other  diabetes, Type 2  Renal/GU      Musculoskeletal  (+) Arthritis ,   Abdominal   Peds  Hematology   Anesthesia Other Findings   Reproductive/Obstetrics                             Anesthesia Physical Anesthesia Plan  ASA: 3  Anesthesia Plan: General   Post-op Pain Management:    Induction: Intravenous  PONV Risk Score and Plan: 4 or greater and Propofol infusion and Treatment may vary due to age or medical condition  Airway Management Planned: Nasal Cannula  Additional Equipment:   Intra-op Plan:   Post-operative Plan:   Informed Consent: I have reviewed the patients History and Physical, chart, labs and discussed the procedure including the risks, benefits and alternatives for the proposed anesthesia with the patient or authorized representative who has indicated his/her understanding and acceptance.     Dental advisory given  Plan Discussed with: CRNA, Anesthesiologist and Surgeon  Anesthesia Plan Comments:         Anesthesia Quick Evaluation

## 2022-02-19 NOTE — H&P (Signed)
[name] is an 71 y.o. female.   Chief Complaint: Obstructive sleep apnea HPI: History of moderately severe obstructive sleep apnea, unable to tolerate CPAP as a long-term treatment option.  Past Medical History:  Diagnosis Date   Allergy    Anxiety    Arthritis    back & knee   Asthma     mild per pt shows up with resp illness   Colon polyps    Constipation    Diabetes mellitus without complication (HCC)    Gallstones    Gastric polyps    Gastroparesis    GERD (gastroesophageal reflux disease)    Headache    sinus headaches and migraines at times   Heart murmur    History of migraine headaches    HTN (hypertension)    Hyperlipidemia    IBS (irritable bowel syndrome)    Joint pain    Lumbar disc disease    PONV (postoperative nausea and vomiting)    S/P aortic valve replacement with bioprosthetic valve 08/23/2016   25 mm Edwards Intuity rapid-deployment bovine pericardial tissue valve via partial upper mini sternotomy   Sleep apnea    does not use CPAP   TIA (transient ischemic attack)    TIA (transient ischemic attack)    hx of per pt    Past Surgical History:  Procedure Laterality Date   ABDOMINAL HYSTERECTOMY     AORTIC VALVE REPLACEMENT N/A 08/23/2016   Procedure: AORTIC VALVE REPLACEMENT (AVR) - using partial Upper Sternotomy- 70m Edwards Intuity Aortic Valve used;  Surgeon: CRexene Alberts MD;  [place] [facility]  Service: Open Heart Surgery;  Laterality: N/A;   BLADDER SUSPENSION  2007   BREAST ENHANCEMENT SURGERY  1980   CARPAL TUNNEL RELEASE Bilateral    COLONOSCOPY     DORSAL COMPARTMENT RELEASE Right 09/15/2014   Procedure: RIGHT WRIST DEQUERVAINS RELEASE ;  Surgeon: DKathryne Hitch MD;  [place] [facility]  Service: Orthopedics;  Laterality: Right;   ESOPHAGOGASTRODUODENOSCOPY     EYE SURGERY     cataract surgery   KNEE ARTHROSCOPY  04/15/2012   Procedure: ARTHROSCOPY KNEE;  Surgeon: DNinetta Lights MD;  [place] [facility]   Service: Orthopedics;  Laterality: Right;   LAPAROSCOPIC CHOLECYSTECTOMY     LASIK     OOPHORECTOMY     PALATE TO GINGIVA GRAFT  2017   REVERSE SHOULDER ARTHROPLASTY Left 02/13/2021   Procedure: REVERSE SHOULDER ARTHROPLASTY;  Surgeon: LMarchia Bond MD;  Location: [facility];  Service: Orthopedics;  Laterality: Left;   RIGHT/LEFT HEART CATH AND CORONARY ANGIOGRAPHY N/A 08/02/2016   Procedure: Right/Left Heart Cath and Coronary Angiography;  Surgeon: MSherren Mocha MD;  [facility];  Service: Cardiovascular;  Laterality: N/A;   ROTATOR CUFF REPAIR Left    TEE WITHOUT CARDIOVERSION N/A 08/23/2016   Procedure: TRANSESOPHAGEAL ECHOCARDIOGRAM (TEE);  Surgeon: CRexene Alberts MD;  [place] [facility]  Service: Open Heart Surgery;  Laterality: N/A;   TOTAL KNEE ARTHROPLASTY  04/15/2012   Procedure: TOTAL KNEE ARTHROPLASTY;  Surgeon: DNinetta Lights MD;  [place] [facility]  Service: Orthopedics;  Laterality: Right;   TRIGGER FINGER RELEASE Right 04/20/2015   Procedure: RIGHT TRIGGER FINGER RELEASE (TENDON SHEALTH INCISION) ;  Surgeon: DNinetta Lights MD;  [place] [facility]  Service: Orthopedics;  Laterality: Right;    Family History  Problem Relation Age of Onset   COPD Mother    Colon polyps Mother    Irritable bowel  syndrome Mother    Anxiety disorder Mother    Heart disease Father    Alcohol abuse Father    Breast cancer Maternal Grandmother    Gallbladder disease Maternal Grandmother    Colon polyps Maternal Aunt    Diabetes Paternal Uncle        several uncles   Other Neg Hx        hyponatremia   Colon cancer Neg Hx    Esophageal cancer Neg Hx    Rectal cancer Neg Hx    Stomach cancer Neg Hx    Social History:  reports that she has never smoked. She has never used smokeless tobacco. She reports that she does not drink alcohol and does not use drugs.  Allergies:  Allergies  Allergen Reactions   Augmentin [Amoxicillin-Pot Clavulanate] Rash    Erythromycin Nausea Only   Penicillins Itching and Rash    Has patient had a PCN reaction causing immediate rash, facial/tongue/throat swelling, SOB or lightheadedness with hypotension: No Has patient had a PCN reaction causing severe rash involving mucus membranes or skin necrosis: No Has patient had a PCN reaction that required hospitalization: No Has patient had a PCN reaction occurring within the last 10 years: No  If all of the above answers are "NO", then may proceed with Cephalosporin use. Tolerated Ancef 02/13/21    Medications Prior to Admission  Medication Sig Dispense Refill   amLODipine (NORVASC) 10 MG tablet Take 1 tablet (10 mg total) by mouth daily. 90 tablet 3   atorvastatin (LIPITOR) 40 MG tablet TAKE 1 TABLET BY MOUTH DAILY AT 6 PM. 90 tablet 3   Biotin 5000 MCG TABS Take 5,000 mcg by mouth daily.      calcium-vitamin D (OSCAL WITH D) 500-200 MG-UNIT per tablet Take 2 tablets by mouth daily.      Cholecalciferol (VITAMIN D) 125 MCG (5000 UT) CAPS Take 5,000 Units by mouth in the morning.     cloNIDine (CATAPRES) 0.1 MG tablet Take 1 tablet (0.1 mg total) by mouth at bedtime. 90 tablet 3   clopidogrel (PLAVIX) 75 MG tablet TAKE 1 TABLET BY MOUTH EVERY DAY 90 tablet 3   demeclocycline (DECLOMYCIN) 300 MG tablet Take 1 tablet (300 mg total) by mouth 2 (two) times daily. 180 tablet 3   fluticasone (FLONASE) 50 MCG/ACT nasal spray Place 2 sprays into both nostrils in the morning.     ipratropium (ATROVENT) 0.03 % nasal spray 2 SPRAYS IN EACH NOSTRIL AS NEEDED THREE TIMES AS NEEDED     irbesartan (AVAPRO) 300 MG tablet Take 1 tablet (300 mg total) by mouth daily. 90 tablet 3   loratadine (CLARITIN) 10 MG tablet Take 10 mg by mouth at bedtime.      Melatonin 5 MG TABS Take 5 mg by mouth at bedtime as needed (sleep).     metFORMIN (GLUCOPHAGE) 500 MG tablet Take 1 tablet (500 mg total) by mouth daily with breakfast. 180 tablet 1   metoCLOPramide (REGLAN) 10 MG tablet TAKE 1/2  TABLET BY MOUTH 4 TIMES A DAY 60 tablet 3   Multiple Vitamin (MULTIVITAMIN WITH MINERALS) TABS tablet Take 1 tablet by mouth in the morning.     omeprazole (PRILOSEC) 40 MG capsule Take 40 mg by mouth daily.     Probiotic Product (ALIGN) 4 MG CAPS Take 4 mg by mouth daily.      psyllium (METAMUCIL SMOOTH TEXTURE) 28 % packet Take 1 packet by mouth 2 (two) times daily.  sodium chloride 1 g tablet Take 3 g by mouth 2 (two) times daily with a meal.     venlafaxine XR (EFFEXOR-XR) 75 MG 24 hr capsule TAKE 1 CAPSULE BY MOUTH DAILY WITH BREAKFAST. 90 capsule 2   ACCU-CHEK GUIDE test strip USE TO TEST BLOOD SUGARS DAILY. DX: E11.9 100 strip 12   Accu-Chek Softclix Lancets lancets USE AS INSTRUCTED 100 each 12   blood glucose meter kit and supplies KIT Dispense based on patient and insurance preference. Use up to four times daily as directed. Dx: E11.9 1 each 0    Results for orders placed or performed during the hospital encounter of 02/19/22 (from the past 48 hour(s))  Glucose, capillary     Status: Abnormal   Collection Time: 02/19/22  8:10 AM  Result Value Ref Range   Glucose-Capillary 116 (H) 70 - 99 mg/dL    Comment: Glucose reference range applies only to samples taken after fasting for at least 8 hours.   *Note: Due to a large number of results and/or encounters for the requested time period, some results have not been displayed. A complete set of results can be found in Results Review.   No results found.  Review of Systems  Respiratory:  Positive for apnea.     Blood pressure (!) 163/52, pulse 63, temperature 99.1 F (37.3 C), temperature source Oral, resp. rate 15, height 5' 6"  (1.676 m), weight 88.7 kg, SpO2 100 %. Physical Exam Constitutional:      Appearance: Normal appearance.  Cardiovascular:     Rate and Rhythm: Normal rate.  Pulmonary:     Effort: Pulmonary effort is normal.  Musculoskeletal:     Cervical back: Normal range of motion.  Neurological:     Mental  Status: She is alert.      Assessment/Plan Patient admitted for outpatient surgical procedure under sedated anesthesia: Drug-induced sleep endoscopy.  Jerrell Belfast, MD 02/19/2022, 8:26 AM

## 2022-02-19 NOTE — Discharge Instructions (Signed)

## 2022-02-19 NOTE — Anesthesia Procedure Notes (Signed)
Procedure Name: MAC Date/Time: 02/19/2022 9:14 AM  Performed by: Signe Colt, CRNAPre-anesthesia Checklist: Patient identified, Emergency Drugs available, Suction available, Patient being monitored and Timeout performed Patient Re-evaluated:Patient Re-evaluated prior to induction Oxygen Delivery Method: Simple face mask

## 2022-02-20 ENCOUNTER — Encounter (HOSPITAL_BASED_OUTPATIENT_CLINIC_OR_DEPARTMENT_OTHER): Payer: Self-pay | Admitting: Otolaryngology

## 2022-02-20 ENCOUNTER — Other Ambulatory Visit: Payer: Self-pay

## 2022-02-20 NOTE — Patient Outreach (Signed)
  Care Coordination   Initial Visit Note   02/20/2022 Name: [name] MRN: [id number] DOB: Sep 14, 1950  [name] is a 71 y.o. year old female who sees Yong Channel, Brayton Mars, MD for primary care. I spoke with  [name] by phone today.  What matters to the patients health and wellness today?  No concerns today    Goals Addressed             This Visit's Progress    COMPLETED: Care Coordination Activities - no follow up required       Care Coordination Interventions: Advised patient to call to schedule Annual Wellness Visit Provided education to patient re: Annual Wellness Visit, care coordination services Assessed social determinant of health barriers          SDOH assessments and interventions completed:  Yes  SDOH Interventions Today    Flowsheet Row Most Recent Value  SDOH Interventions   Food Insecurity Interventions Intervention Not Indicated  Financial Strain Interventions Intervention Not Indicated  Housing Interventions Intervention Not Indicated  Transportation Interventions Intervention Not Indicated        Care Coordination Interventions Activated:  Yes  Care Coordination Interventions:  Yes, provided   Follow up plan: No further intervention required.   Encounter Outcome:  Pt. Visit Completed  Peter Garter RN, BSN,CCM, CDE Care Management Coordinator [facility] [phone]

## 2022-02-20 NOTE — Anesthesia Postprocedure Evaluation (Signed)
Anesthesia Post Note  Patient: [name]  Procedure(s) Performed: DRUG INDUCED SLEEP ENDOSCOPY (Nose)     Patient location during evaluation: PACU Anesthesia Type: MAC Level of consciousness: awake and alert Pain management: pain level controlled Vital Signs Assessment: post-procedure vital signs reviewed and stable Respiratory status: spontaneous breathing, nonlabored ventilation, respiratory function stable and patient connected to nasal cannula oxygen Cardiovascular status: stable and blood pressure returned to baseline Postop Assessment: no apparent nausea or vomiting Anesthetic complications: no   No notable events documented.  Last Vitals:  Vitals:   02/19/22 0945 02/19/22 0956  BP: 116/74 (!) 188/57  Pulse: (!) 58 (!) 58  Resp: 17 18  Temp:  36.7 C  SpO2: 97% 97%    Last Pain:  Vitals:   02/19/22 0956  TempSrc:   PainSc: 0-No pain                 Kiyonna Tortorelli S

## 2022-02-20 NOTE — Patient Instructions (Signed)
Visit Information  Thank you for taking time to visit with me today. Please don't hesitate to contact me if I can be of assistance to you.   Following are the goals we discussed today:   Goals Addressed             This Visit's Progress    COMPLETED: Care Coordination Activities - no follow up required       Care Coordination Interventions: Advised patient to call to schedule Annual Wellness Visit Provided education to patient re: Annual Wellness Visit, care coordination services Assessed social determinant of health barriers          If you are experiencing a Mental Health or Behavioral Health Crisis or need someone to talk to, please call the Suicide and Crisis Lifeline: 988 call the [place] National Suicide Prevention Lifeline: 1-800-273-8255 or TTY: 1-800-799-4 TTY (1-800-799-4889) to talk to a trained counselor call 1-800-273-TALK (toll free, 24 hour hotline) go to [facility] [address], [place] ([phone]) call 911   Patient verbalizes understanding of instructions and care plan provided today and agrees to view in MyChart. Active MyChart status and patient understanding of how to access instructions and care plan via MyChart confirmed with patient.     No further follow up required:    Persephonie Hegwood RN, BSN,CCM, CDE Care Management Coordinator [facility] [phone]     

## 2022-03-05 ENCOUNTER — Ambulatory Visit (INDEPENDENT_AMBULATORY_CARE_PROVIDER_SITE_OTHER): Payer: Medicare PPO | Admitting: Adult Health

## 2022-03-05 ENCOUNTER — Encounter (INDEPENDENT_AMBULATORY_CARE_PROVIDER_SITE_OTHER): Payer: Self-pay | Admitting: Adult Health

## 2022-03-05 VITALS — BP 108/68 | HR 69 | Temp 98.6°F | Ht 66.0 in | Wt 188.0 lb

## 2022-03-05 DIAGNOSIS — E669 Obesity, unspecified: Secondary | ICD-10-CM | POA: Diagnosis not present

## 2022-03-05 DIAGNOSIS — E1169 Type 2 diabetes mellitus with other specified complication: Secondary | ICD-10-CM

## 2022-03-05 DIAGNOSIS — Z6830 Body mass index (BMI) 30.0-30.9, adult: Secondary | ICD-10-CM | POA: Diagnosis not present

## 2022-03-05 DIAGNOSIS — Z7984 Long term (current) use of oral hypoglycemic drugs: Secondary | ICD-10-CM

## 2022-03-05 DIAGNOSIS — E559 Vitamin D deficiency, unspecified: Secondary | ICD-10-CM

## 2022-03-06 LAB — INSULIN, RANDOM: INSULIN: 6.3 u[IU]/mL (ref 2.6–24.9)

## 2022-03-06 LAB — HEMOGLOBIN A1C
Est. average glucose Bld gHb Est-mCnc: 134 mg/dL
Hgb A1c MFr Bld: 6.3 % — ABNORMAL HIGH (ref 4.8–5.6)

## 2022-03-06 LAB — VITAMIN D 25 HYDROXY (VIT D DEFICIENCY, FRACTURES): Vit D, 25-Hydroxy: 62.2 ng/mL (ref 30.0–100.0)

## 2022-03-07 DIAGNOSIS — M5416 Radiculopathy, lumbar region: Secondary | ICD-10-CM | POA: Diagnosis not present

## 2022-03-07 NOTE — Telephone Encounter (Signed)
That is great. Please send my note and her sleep study results

## 2022-03-10 ENCOUNTER — Encounter: Payer: Self-pay | Admitting: Family Medicine

## 2022-03-10 NOTE — Progress Notes (Unsigned)
Chief Complaint:   OBESITY [name] is here to discuss her progress with her obesity treatment plan along with follow-up of her obesity related diagnoses. [name] is on the Category 2 Plan and states she is following her eating plan approximately 70-80% of the time. [name] states she is not exercising due to back pain.  Today's visit was #: 65 Starting weight: 220 lbs Starting date: 01/19/2018 Today's weight: 188 lbs Today's date: 03/05/2022 Total lbs lost to date: 32 lbs Total lbs lost since last in-office visit: 6 lbs  Interim History: 02/19/2022: Drug induced sleep endoscopy, 04/03/2022 annual CPE with PCP Dr Yong Channel, 04/03/2022, ENT Dr Wilburn Cornelia follow up.   Subjective:   1. Type 2 diabetes mellitus with other specified complication, without long-term current use of insulin (HCC) A1c *** Fasting CBG ,130 Only on metformin 500 mg at breakfast, PCP manages.   2. Vitamin D deficiency [place] with D, 500/200 mg 2 tabs daily.  OTC Vitamin D-3, 5,000 IU daily.   Assessment/Plan:   1. Type 2 diabetes mellitus with other specified complication, without long-term current use of insulin (HCC) Check labs  - Hemoglobin A1c - Insulin, random  2. Vitamin D deficiency Check labs  - VITAMIN D 25 Hydroxy (Vit-D Deficiency, Fractures)  3. Obesity, current BMI 30.4 [name] is currently in the action stage of change. As such, her goal is to continue with weight loss efforts. She has agreed to the Category 2 Plan.   Exercise goals: All adults should avoid inactivity. Some physical activity is better than none, and adults who participate in any amount of physical activity gain some health benefits.  Behavioral modification strategies: increasing lean protein intake, decreasing simple carbohydrates, meal planning and cooking strategies, keeping healthy foods in the home, and planning for success.  [name] has agreed to follow-up with our clinic in 4 weeks. She was  informed of the importance of frequent follow-up visits to maximize her success with intensive lifestyle modifications for her multiple health conditions.   [name] was informed we would discuss her lab results at her next visit unless there is a critical issue that needs to be addressed sooner. [name] agreed to keep her next visit at the agreed upon time to discuss these results.  Objective:   Blood pressure 108/68, pulse 69, temperature 98.6 F (37 C), height '5\' 6"'$  (1.676 m), weight 188 lb (85.3 kg), SpO2 97 %. Body mass index is 30.34 kg/m.  General: Cooperative, alert, well developed, in no acute distress. HEENT: Conjunctivae and lids unremarkable. Cardiovascular: Regular rhythm.  Lungs: Normal work of breathing. Neurologic: No focal deficits.   Lab Results  Component Value Date   CREATININE 0.58 02/14/2022   BUN 17 02/14/2022   NA 135 02/14/2022   K 4.8 02/14/2022   CL 102 02/14/2022   CO2 25 02/14/2022   Lab Results  Component Value Date   ALT 15 06/05/2021   AST 27 06/05/2021   ALKPHOS 251 (H) 06/05/2021   BILITOT 0.4 06/05/2021   Lab Results  Component Value Date   HGBA1C 6.3 (H) 03/05/2022   HGBA1C 6.3 09/24/2021   HGBA1C 6.6 (H) 06/05/2021   HGBA1C 6.5 (H) 02/09/2021   HGBA1C 6.3 (H) 01/25/2021   Lab Results  Component Value Date   INSULIN 6.3 03/05/2022   INSULIN 12.9 06/05/2021   INSULIN 7.7 01/25/2021   INSULIN 13.1 04/20/2020   INSULIN 12.1 01/19/2018   Lab Results  Component Value Date   TSH 4.23 09/17/2021   Lab  Results  Component Value Date   CHOL 144 06/05/2021   HDL 64 06/05/2021   LDLCALC 65 06/05/2021   LDLDIRECT 58.0 11/25/2017   TRIG 75 06/05/2021   CHOLHDL 2.3 06/05/2021   Lab Results  Component Value Date   VD25OH 62.2 03/05/2022   VD25OH 70.1 06/05/2021   VD25OH 74.5 01/25/2021   Lab Results  Component Value Date   WBC 7.5 09/24/2021   HGB 12.0 09/24/2021   HCT 36.7 09/24/2021   MCV 83.4 09/24/2021   PLT 309.0  09/24/2021   No results found for: "IRON", "TIBC", "FERRITIN"  Obesity Behavioral Intervention:   Approximately 15 minutes were spent on the discussion below.  ASK: We discussed the diagnosis of obesity with [name] today and [name] agreed to give [name] permission to discuss obesity behavioral modification therapy today.  ASSESS: [name] has the diagnosis of obesity and her BMI today is 30.4. [name] is in the action stage of change.   ADVISE: [name] was educated on the multiple health risks of obesity as well as the benefit of weight loss to improve her health. She was advised of the need for long term treatment and the importance of lifestyle modifications to improve her current health and to decrease her risk of future health problems.  AGREE: Multiple dietary modification options and treatment options were discussed and [name] agreed to follow the recommendations documented in the above note.  ARRANGE: [name] was educated on the importance of frequent visits to treat obesity as outlined per CMS and USPSTF guidelines and agreed to schedule her next follow up appointment today.  Attestation Statements:   Reviewed by clinician on day of visit: allergies, medications, problem list, medical history, surgical history, family history, social history, and previous encounter notes.  I, Davy Pique, RMA, am acting as Location manager for Mina Marble, NP.  I have reviewed the above documentation for accuracy and completeness, and I agree with the above. -  ***

## 2022-03-17 NOTE — Progress Notes (Deleted)
Cardiology Office Note:    Date:  03/17/2022   ID:  [name], DOB 11-10-1950, MRN [id number]  PCP:  [name], MD   [facility] Providers Cardiologist:  Freada Bergeron, MD {   Referring MD: [name], MD    History of Present Illness:    [name] is a 71 y.o. female with a hx of DMII, HLD, OSA on CPAP, and severe AS s/p bioprosthetic AVR in 08/2016 who was previously followed by Dr. Meda Coffee who now returns to clinic for follow-up.  Per review of the record, the patient has a history of aortic valve replacement with bioprosthetic bovine pericardial tissue valve 08/23/16. Pre-op cath with only minor nonobstructive CAD. Also right heart cath showed normal filling pressures. Echocardiogram showed normal EF of 60-65% and grade 1 diastolic dysfunction.  The patient was very motivated post surgery, underwent complete rehab program and dietary program and has lost 30 pounds.   Has history of occipital stroke in 2019. Cardiac monitor negative for Afib. Has been maintained on plavix.  Was last seen in clinic on 09/13/21 where she was doing well from a CV standpoint.   Today, ***  Past Medical History:  Diagnosis Date   Allergy    Anxiety    Arthritis    back & knee   Asthma     mild per pt shows up with resp illness   Colon polyps    Constipation    Diabetes mellitus without complication (HCC)    Gallstones    Gastric polyps    Gastroparesis    GERD (gastroesophageal reflux disease)    Headache    sinus headaches and migraines at times   Heart murmur    History of migraine headaches    HTN (hypertension)    Hyperlipidemia    IBS (irritable bowel syndrome)    Joint pain    Lumbar disc disease    PONV (postoperative nausea and vomiting)    S/P aortic valve replacement with bioprosthetic valve 08/23/2016   25 mm Edwards Intuity rapid-deployment bovine pericardial tissue valve via partial upper mini sternotomy   Sleep apnea    does not  use CPAP   TIA (transient ischemic attack)    TIA (transient ischemic attack)    hx of per pt    Past Surgical History:  Procedure Laterality Date   ABDOMINAL HYSTERECTOMY     AORTIC VALVE REPLACEMENT N/A 08/23/2016   Procedure: AORTIC VALVE REPLACEMENT (AVR) - using partial Upper Sternotomy- 63m Edwards Intuity Aortic Valve used;  Surgeon: CRexene Alberts MD;  Location[facility]  Service: Open Heart Surgery;  Laterality: N/A;   BLADDER SUSPENSION  2007   BREAST ENHANCEMENT SURGERY  1980   CARPAL TUNNEL RELEASE Bilateral    COLONOSCOPY     DORSAL COMPARTMENT RELEASE Right 09/15/2014   Procedure: RIGHT WRIST DEQUERVAINS RELEASE ;  Surgeon: DKathryne Hitch MD;  Location[facility]  Service: Orthopedics;  Laterality: Right;   DRUG INDUCED ENDOSCOPY N/A 02/19/2022   Procedure: DRUG INDUCED SLEEP ENDOSCOPY;  Surgeon: SJerrell Belfast MD;  Location[facility]  Service: ENT;  Laterality: N/A;   ESOPHAGOGASTRODUODENOSCOPY     EYE SURGERY     cataract surgery   KNEE ARTHROSCOPY  04/15/2012   Procedure: ARTHROSCOPY KNEE;  Surgeon: DNinetta Lights MD;  [facility]  Service: Orthopedics;  Laterality: Right;   LAPAROSCOPIC CHOLECYSTECTOMY     LASIK     OOPHORECTOMY  PALATE TO GINGIVA GRAFT  2017   REVERSE SHOULDER ARTHROPLASTY Left 02/13/2021   Procedure: REVERSE SHOULDER ARTHROPLASTY;  Surgeon: Marchia Bond, MD;  Location: [facility];  Service: Orthopedics;  Laterality: Left;   RIGHT/LEFT HEART CATH AND CORONARY ANGIOGRAPHY N/A 08/02/2016   Procedure: Right/Left Heart Cath and Coronary Angiography;  Surgeon: Sherren Mocha, MD;  Location: [facility];  Service: Cardiovascular;  Laterality: N/A;   ROTATOR CUFF REPAIR Left    TEE WITHOUT CARDIOVERSION N/A 08/23/2016   Procedure: TRANSESOPHAGEAL ECHOCARDIOGRAM (TEE);  Surgeon: Rexene Alberts, MD;  Location: [place] [facility];  Service: Open Heart Surgery;  Laterality: N/A;   TOTAL KNEE ARTHROPLASTY  04/15/2012    Procedure: TOTAL KNEE ARTHROPLASTY;  Surgeon: [name], MD;  Location: [place];  Service: Orthopedics;  Laterality: Right;   TRIGGER FINGER RELEASE Right 04/20/2015   Procedure: RIGHT TRIGGER FINGER RELEASE (TENDON SHEALTH INCISION) ;  Surgeon: [name], MD;  Location: [facility];  Service: Orthopedics;  Laterality: Right;    Current Medications: No outpatient medications have been marked as taking for the 03/21/22 encounter (Appointment) with Freada Bergeron, MD.     Allergies:   Augmentin [amoxicillin-pot clavulanate], Erythromycin, and Penicillins   Social History   Socioeconomic History   Marital status: Married    Spouse name: [name]   Number of children: 1   Years of education: Not on file   Highest education level: Not on file  Occupational History   Occupation: Retired, Curator: RETIRED  Tobacco Use   Smoking status: Never   Smokeless tobacco: Never  Vaping Use   Vaping Use: Never used  Substance and Sexual Activity   Alcohol use: No   Drug use: No   Sexual activity: Yes    Birth control/protection: Surgical    Comment: hyst  Other Topics Concern   Not on file  Social History Narrative   She lives with husband (1978) and two dogs. Step-son [name] (1971-psychiatrist in [place]). No grandkids. 2 dogs-sheltie/collie mix and border collie mix      Highest level of education:  Master in education   She is retired Animal nutritionist x 32 years.      Hobbies: time with dogs   Right handed   One story home         Social Determinants of Health   Financial Resource Strain: Low Risk  (04/21/2020)   Overall Financial Resource Strain (CARDIA)    Difficulty of Paying Living Expenses: Not hard at all  Food Insecurity: No Food Insecurity (02/20/2022)   Hunger Vital Sign    Worried About Running Out of Food in the Last Year: Never true    Ran Out of Food in the Last Year: Never true  Transportation Needs: No  Transportation Needs (02/20/2022)   PRAPARE - Hydrologist (Medical): No    Lack of Transportation (Non-Medical): No  Physical Activity: Insufficiently Active (04/21/2020)   Exercise Vital Sign    Days of Exercise per Week: 3 days    Minutes of Exercise per Session: 20 min  Stress: No Stress Concern Present (04/21/2020)   [place]    Feeling of Stress : Not at all  Social Connections: Moderately Isolated (04/21/2020)   Social Connection and Isolation Panel [NHANES]    Frequency of Communication with Friends and Family: More than three times a week    Frequency  of Social Gatherings with Friends and Family: Once a week    Attends Religious Services: Never    Marine scientist or Organizations: No    Attends Music therapist: Never    Marital Status: Married     Family History: The patient's family history includes Alcohol abuse in her father; Anxiety disorder in her mother; Breast cancer in her maternal grandmother; COPD in her mother; Colon polyps in her maternal aunt and mother; Diabetes in her paternal uncle; Gallbladder disease in her maternal grandmother; Heart disease in her father; Irritable bowel syndrome in her mother. There is no history of Other, Colon cancer, Esophageal cancer, Rectal cancer, or Stomach cancer.  ROS:   Please see the history of present illness.    Review of Systems  Constitutional:  Negative for malaise/fatigue and weight loss.  HENT:  Negative for congestion and sore throat.   Eyes:  Negative for blurred vision.  Respiratory:  Negative for cough and shortness of breath.   Cardiovascular:  Negative for chest pain, palpitations, orthopnea, claudication, leg swelling and PND.  Gastrointestinal:  Negative for heartburn and nausea.  Genitourinary:  Negative for dysuria and urgency.  Musculoskeletal:  Negative for joint pain and myalgias.  Skin:   Negative for itching and rash.  Neurological:  Negative for dizziness and headaches.  Endo/Heme/Allergies:  Does not bruise/bleed easily.  Psychiatric/Behavioral:  The patient is not nervous/anxious and does not have insomnia.    All other systems reviewed and are negative.  EKGs/Labs/Other Studies Reviewed:    The following studies were reviewed today: Echo 08/30/21 1. Left ventricular ejection fraction, by estimation, is 60 to 65%. The left ventricle has normal function. The left ventricle has no regional wall motion abnormalities. Left ventricular diastolic parameters were normal.   2. Right ventricular systolic function is normal. The right ventricular size is normal.   3. The mitral valve is normal in structure. Mild to moderate mitral valve regurgitation. No evidence of mitral stenosis.   4. The aortic valve has been repaired/replaced. Perivalvular Aortic valve regurgitation is mild to moderate and eccentric. There is a 25 mm Edwards Intuity bovine pericardial valve present in the aortic position. Procedure Date: 08/23/2016. Aortic valve  area, by VTI measures 1.78 cm. Aortic valve mean gradient measures 9.0 mmHg. Aortic valve Vmax measures 2.07 m/s.   5. The inferior vena cava is normal in size with greater than 50% respiratory variability, suggesting right atrial pressure of 3 mmHg.   Monitor 08/26/17 Sinus rhythm the entire monitoring time. Sinus rhythm the entire monitoring time. No arrhythmias detected, no atrial fibrillation.  Head CT 08/12/17 IMPRESSION: 1. Small, patchy hypodensity in the right occipital lobe, concerning for subacute infarct.  Right/Left Heart Cath and Coronary Angiography 08/02/16   1. Minor nonobstructive CAD 2. Normal right heart hemodynamics (including normal PCWP) 3. Known severe aortic stenosis     Intraoperative TEE 08/23/16 Left ventricle: Normal cavity size. Concentric hypertrophy of moderate severity. LV systolic function is normal with an EF of  60-65%. There are no obvious wall motion abnormalities. Septum: No Patent Foramen Ovale present. Left atrium: Patent foramen ovale not present. Aortic valve: The valve is bicuspid. Severe valve thickening present. Severe valve calcification present. Moderately decreased leaflet separation. Severe stenosis. No regurgitation. No AV vegetation. No evidence of papillary fibroelastoma. Aorta: The ascending aorta is mildly dilated. Mitral valve: No leaflet thickening and calcification present. Mild mitral annular calcification. Right ventricle: Normal cavity size, wall thickness and ejection fraction. Tricuspid valve:  Trace regurgitation. The tricuspid valve regurgitation jet is central.     TTE: 08/26/2017  Left ventricle: The cavity size was mildly dilated. Wall   thickness was increased in a pattern of mild LVH. Systolic   function was normal. The estimated ejection fraction was in the range of 60% to 65%. Left ventricular diastolic function parameters were normal. - Aortic valve: patient had a Edwards 25 mm Intuity Elite rapid  deployment bovine pericardial valve placed 09/12/16. 05/01/17 mean   gradient was 6 mmHg peak gradient 12 mmHg. Gradients have increased now 17 mmHg and 34 mmHg peak. There is a mild leak seen through the basal stented portion of the valve appears at around 10:00 on BSA views - Mitral valve: There was mild regurgitation. - Atrial septum: No defect or patent foramen ovale was identified.   TTE12/21/2020  1. Left ventricular ejection fraction, by visual estimation, is 60 to 65%. The left ventricle has normal function. There is no left ventricular hypertrophy.   2. Abnormal septal motion consistent with post-operative status.   3. The left ventricle has no regional wall motion abnormalities.   4. Global right ventricle has normal systolic function.The right ventricular size is normal. No increase in right ventricular wall thickness.   5. Left atrial size was mildly dilated.    6. Right atrial size was normal.   7. Bioprosthetic aortic valve with previously described mild perivalvular leak. Normal prosthetic gradients.   8. The mitral valve is normal in structure. Trivial mitral valve regurgitation. No evidence of mitral stenosis.   9. The tricuspid valve is normal in structure. Tricuspid valve regurgitation is not demonstrated.  10. The pulmonic valve was normal in structure. Pulmonic valve regurgitation is not visualized.  11. The inferior vena cava is normal in size with greater than 50% respiratory variability, suggesting right atrial pressure of 3 mmHg.  12. Aortic prosthesis gradients are lower, otherwise no change since 08/26/2017.  13. Prior images reviewed side by side.  EKG:  EKG was not ordered today  Recent Labs: 06/05/2021: ALT 15 09/17/2021: TSH 4.23 09/24/2021: Hemoglobin 12.0; Platelets 309.0 02/14/2022: BUN 17; Creatinine, Ser 0.58; Potassium 4.8; Sodium 135  Recent Lipid Panel    Component Value Date/Time   CHOL 144 06/05/2021 1230   TRIG 75 06/05/2021 1230   HDL 64 06/05/2021 1230   CHOLHDL 2.3 06/05/2021 1230   CHOLHDL 2 06/08/2019 0949   VLDL 13.0 06/08/2019 0949   LDLCALC 65 06/05/2021 1230   LDLDIRECT 58.0 11/25/2017 1156     Risk Assessment/Calculations:           Physical Exam:    VS:  LMP  (LMP Unknown)     Wt Readings from Last 3 Encounters:  03/05/22 188 lb (85.3 kg)  02/19/22 195 lb 8.8 oz (88.7 kg)  02/05/22 194 lb (88 kg)     GEN: Well nourished, well developed in no acute distress HEENT: Normal NECK: No JVD; No carotid bruits LYMPHATICS: No lymphadenopathy CARDIAC: RRR, no murmurs, rubs, gallops RESPIRATORY:  Clear to auscultation without rales, wheezing or rhonchi  ABDOMEN: Soft, non-tender, non-distended MUSCULOSKELETAL:  No edema; No deformity  SKIN: Warm and dry NEUROLOGIC:  Alert and oriented x 3 PSYCHIATRIC:  Normal affect   ASSESSMENT:    No diagnosis found.  PLAN:    In order of problems  listed above:  #Severe AS s/p bioprosthetic AVR in 2018: #Mild-to-Moderate PVL: Most recent TTE with LVEF 60-65%, normal RV, mild-to moderate MR, mild to moderate AR with  mean gradient 18mHg. Doing very well with no HF or exertional symptoms -Continue serial monitoring with TTE (next 08/2022)  #Nonobstructive CAD: Pre-op cath with mild disease. No anginal symptoms.   #Chronic Diastolic HF: Euvolemic and compensated on exam -Continue losartan '100mg'$  daily -Low Na diet  #HLD: -Continue lipitor '40mg'$  daily -LDL controlled at 65 (goal <70)  #OSA on CPAP: -Continue CPAP  #HTN: Controlled and at goal <130/90 -Continue amlodipine '10mg'$  daily -Continue clonidine 0.'1mg'$  qHS -Continue losartan '100mg'$  daily  #History of CVA: Occurred in 2019. CT head with right occipital infarct. Cardiac monitor with no evidence of Afib. Maintained on lipitor and plavix. -Continue plavix '75mg'$  daily -Continue lipitor '40mg'$  daily      Medication Adjustments/Labs and Tests Ordered: Current medicines are reviewed at length with the patient today.  Concerns regarding medicines are outlined above.  No orders of the defined types were placed in this encounter.  No orders of the defined types were placed in this encounter.   There are no Patient Instructions on file for this visit.    Signed, HFreada Bergeron MD  03/17/2022 7:53 PM    [facility]

## 2022-03-20 NOTE — Progress Notes (Unsigned)
Office Visit    Patient Name: [name] Date of Encounter: 03/21/2022  Primary Care Provider:  [name], MD Primary Cardiologist:  Hayley Bergeron, MD Primary Electrophysiologist: None  Chief Complaint    [name] is a 71 y.o. female with PMH of HLD, aortic valve replacement s/p bioprosthetic bovine pericardial tissue valve 08/2016, nonobstructive CAD, DM type II, GERD, CVA s/p subacute right occipital lobe, 07/2017 who presents today for follow-up of AVR.  Past Medical History    Past Medical History:  Diagnosis Date   Allergy    Anxiety    Arthritis    back & knee   Asthma     mild per pt shows up with resp illness   Colon polyps    Constipation    Diabetes mellitus without complication (HCC)    Gallstones    Gastric polyps    Gastroparesis    GERD (gastroesophageal reflux disease)    Headache    sinus headaches and migraines at times   Heart murmur    History of migraine headaches    HTN (hypertension)    Hyperlipidemia    IBS (irritable bowel syndrome)    Joint pain    Lumbar disc disease    PONV (postoperative nausea and vomiting)    S/P aortic valve replacement with bioprosthetic valve 08/23/2016   25 mm [name] rapid-deployment bovine pericardial tissue valve via partial upper mini sternotomy   Sleep apnea    does not use CPAP   TIA (transient ischemic attack)    TIA (transient ischemic attack)    hx of per pt   Past Surgical History:  Procedure Laterality Date   ABDOMINAL HYSTERECTOMY     AORTIC VALVE REPLACEMENT N/A 08/23/2016   Procedure: AORTIC VALVE REPLACEMENT (AVR) - using partial Upper Sternotomy- 75m [name] Aortic Valve used;  Surgeon: Hayley Alberts MD;  Location[place] [facility]  Service: Open Heart Surgery;  Laterality: N/A;   BLADDER SUSPENSION  2007   BREAST ENHANCEMENT SURGERY  1980   CARPAL TUNNEL RELEASE Bilateral    COLONOSCOPY     DORSAL COMPARTMENT RELEASE Right 09/15/2014   Procedure:  RIGHT WRIST DEQUERVAINS RELEASE ;  Surgeon: Hayley Hitch MD;  [place] [facility]  Service: Orthopedics;  Laterality: Right;   DRUG INDUCED ENDOSCOPY N/A 02/19/2022   Procedure: DRUG INDUCED SLEEP ENDOSCOPY;  Surgeon: SJerrell Belfast MD;  [place] [facility]  Service: ENT;  Laterality: N/A;   ESOPHAGOGASTRODUODENOSCOPY     EYE SURGERY     cataract surgery   KNEE ARTHROSCOPY  04/15/2012   Procedure: ARTHROSCOPY KNEE;  Surgeon: DNinetta Lights MD;  [place] [facility]  Service: Orthopedics;  Laterality: Right;   LAPAROSCOPIC CHOLECYSTECTOMY     LASIK     OOPHORECTOMY     PALATE TO GINGIVA GRAFT  2017   REVERSE SHOULDER ARTHROPLASTY Left 02/13/2021   Procedure: REVERSE SHOULDER ARTHROPLASTY;  Surgeon: LMarchia Bond MD;  Location: [facility];  Service: Orthopedics;  Laterality: Left;   RIGHT/LEFT HEART CATH AND CORONARY ANGIOGRAPHY N/A 08/02/2016   Procedure: Right/Left Heart Cath and Coronary Angiography;  Surgeon: MSherren Mocha MD;  [facility];  Service: Cardiovascular;  Laterality: N/A;   ROTATOR CUFF REPAIR Left    TEE WITHOUT CARDIOVERSION N/A 08/23/2016   Procedure: TRANSESOPHAGEAL ECHOCARDIOGRAM (TEE);  Surgeon: Hayley Alberts MD;  Location: [facility]  Service: Open Heart Surgery;  Laterality: N/A;   TOTAL KNEE  ARTHROPLASTY  04/15/2012   Procedure: TOTAL KNEE ARTHROPLASTY;  Surgeon: Hayley Lights, MD;  Location: [place];  Service: Orthopedics;  Laterality: Right;   TRIGGER FINGER RELEASE Right 04/20/2015   Procedure: RIGHT TRIGGER FINGER RELEASE (TENDON SHEALTH INCISION) ;  Surgeon: Hayley Lights, MD;  Location: [place];  Service: Orthopedics;  Laterality: Right;    Allergies  Allergies  Allergen Reactions   Augmentin [Amoxicillin-Pot Clavulanate] Rash   Erythromycin Nausea Only   Penicillins Itching and Rash    Has patient had a PCN reaction causing immediate rash, facial/tongue/throat swelling, SOB or  lightheadedness with hypotension: No Has patient had a PCN reaction causing severe rash involving mucus membranes or skin necrosis: No Has patient had a PCN reaction that required hospitalization: No Has patient had a PCN reaction occurring within the last 10 years: No  If all of the above answers are "NO", then may proceed with Cephalosporin use. Tolerated Ancef 02/13/21    History of Present Illness    [name]  is a 71 year old female with the above mention past medical history who presents today for follow-up of AVR repair.  Patient was last seen 08/2021 by Dr. Johney [name].  During visit patient was tolerating her medications without any adverse reactions.  She was being followed by healthy weight and wellness with goal of losing 20 pounds.  Most recent 2D echo was completed on 08/2021 with an EF of 60-65%, no RWMA, normal RV function, mild to moderate MV regurgitation, mild AV regurgitation with gradient of 9.0 mmHg.  [name] presents today for preoperative clearance for upcoming hypoglossal nerve stimulator.  Since last being seen in the office patient reports she has been doing well from the cardiac perspective.  She is euvolemic on examination today and blood pressures are well controlled at 116/58.  She is tolerating her current medications without any adverse reactions or effects.  She is currently unable to exercise due to chronic sciatica and back pain.  She is being treated with epidural steroid injections and hopes to return to activities once discomfort subsides.  She is still currently working on losing 20 pounds and is still dissipating with healthy weight and wellness.  Patient denies chest pain, palpitations, dyspnea, PND, orthopnea, nausea, vomiting, dizziness, syncope, edema, weight gain, or early satiety.  Home Medications    Current Outpatient Medications  Medication Sig Dispense Refill   ACCU-CHEK GUIDE test strip USE TO TEST BLOOD SUGARS DAILY. DX: E11.9 100 strip  12   Accu-Chek Softclix Lancets lancets USE AS INSTRUCTED 100 each 12   amLODipine (NORVASC) 10 MG tablet Take 1 tablet (10 mg total) by mouth daily. 90 tablet 3   atorvastatin (LIPITOR) 40 MG tablet TAKE 1 TABLET BY MOUTH DAILY AT 6 PM. 90 tablet 3   blood glucose meter kit and supplies KIT Dispense based on patient and insurance preference. Use up to four times daily as directed. Dx: E11.9 1 each 0   calcium-vitamin D (OSCAL WITH D) 500-200 MG-UNIT per tablet Take 2 tablets by mouth daily.      cloNIDine (CATAPRES) 0.1 MG tablet Take by mouth.     clopidogrel (PLAVIX) 75 MG tablet TAKE 1 TABLET BY MOUTH EVERY DAY 90 tablet 3   demeclocycline (DECLOMYCIN) 300 MG tablet Take 1 tablet (300 mg total) by mouth 2 (two) times daily. 180 tablet 3   ipratropium (ATROVENT) 0.03 % nasal spray 2 SPRAYS IN EACH NOSTRIL AS NEEDED THREE TIMES AS NEEDED  irbesartan (AVAPRO) 300 MG tablet Take 1 tablet (300 mg total) by mouth daily. 90 tablet 3   loratadine (CLARITIN) 10 MG tablet Take 10 mg by mouth at bedtime.      Melatonin 5 MG TABS Take 5 mg by mouth at bedtime as needed (sleep).     metFORMIN (GLUCOPHAGE) 500 MG tablet Take 1 tablet (500 mg total) by mouth daily with breakfast. 180 tablet 1   metoCLOPramide (REGLAN) 10 MG tablet TAKE 1/2 TABLET BY MOUTH 4 TIMES A DAY 60 tablet 3   Probiotic Product (ALIGN) 4 MG CAPS Take 4 mg by mouth daily.      psyllium (METAMUCIL SMOOTH TEXTURE) 28 % packet Take 1 packet by mouth 2 (two) times daily.     sodium chloride 1 g tablet Take 3 g by mouth 2 (two) times daily with a meal.     venlafaxine XR (EFFEXOR-XR) 75 MG 24 hr capsule TAKE 1 CAPSULE BY MOUTH DAILY WITH BREAKFAST. 90 capsule 2   Acetaminophen Extra Strength 500 MG TABS SMARTSIG:1 By Mouth     Biotin 5 MG TBDP SMARTSIG:1 By Mouth     D-5000 125 MCG (5000 UT) TABS SMARTSIG:1 Tablet(s) By Mouth     fluticasone (FLONASE) 50 MCG/ACT nasal spray Place 2 sprays into both nostrils daily.     metroNIDAZOLE  (METROCREAM) 0.75 % cream      Multiple Vitamins-Minerals (MULTIVITAMIN ADULTS) TABS SMARTSIG:1 By Mouth     omeprazole (PRILOSEC) 40 MG capsule Take 1 capsule by mouth daily.     No current facility-administered medications for this visit.     Review of Systems  Please see the history of present illness.    (+) Chronic back pain  All other systems reviewed and are otherwise negative except as noted above.  Physical Exam    Wt Readings from Last 3 Encounters:  03/21/22 196 lb (88.9 kg)  03/05/22 188 lb (85.3 kg)  02/19/22 195 lb 8.8 oz (88.7 kg)   VS: Vitals:   03/21/22 0810  BP: (!) 116/58  Pulse: 62  SpO2: 97%  ,Body mass index is 31.64 kg/m.  Constitutional:      Appearance: Healthy appearance. Not in distress.  Neck:     Vascular: JVD normal.  Pulmonary:     Effort: Pulmonary effort is normal.     Breath sounds: No wheezing. No rales. Diminished in the bases Cardiovascular:     Normal rate. Regular rhythm. Normal S1. Normal S2.      Murmurs: There is no murmur.  Edema:    Peripheral edema absent.  Abdominal:     Palpations: Abdomen is soft non tender. There is no hepatomegaly.  Skin:    General: Skin is warm and dry.  Neurological:     General: No focal deficit present.     Mental Status: Alert and oriented to person, place and time.     Cranial Nerves: Cranial nerves are intact.  EKG/LABS/Other Studies Reviewed    ECG personally reviewed by me today - None completed today   Risk Assessment/Calculations:      Lab Results  Component Value Date   WBC 7.5 09/24/2021   HGB 12.0 09/24/2021   HCT 36.7 09/24/2021   MCV 83.4 09/24/2021   PLT 309.0 09/24/2021   Lab Results  Component Value Date   CREATININE 0.58 02/14/2022   BUN 17 02/14/2022   NA 135 02/14/2022   K 4.8 02/14/2022   CL 102 02/14/2022   CO2 25 02/14/2022  Lab Results  Component Value Date   ALT 15 06/05/2021   AST 27 06/05/2021   ALKPHOS 251 (H) 06/05/2021   BILITOT 0.4  06/05/2021   Lab Results  Component Value Date   CHOL 144 06/05/2021   HDL 64 06/05/2021   LDLCALC 65 06/05/2021   LDLDIRECT 58.0 11/25/2017   TRIG 75 06/05/2021   CHOLHDL 2.3 06/05/2021    Lab Results  Component Value Date   HGBA1C 6.3 (H) 03/05/2022    Assessment & Plan    1.  Preoperative clearance:  -The patient affirms she has been doing well without any new cardiac symptoms. They are able to achieve 4 METS without cardiac limitations. Therefore, based on ACC/AHA guidelines, the patient would be at acceptable risk for the planned procedure without further cardiovascular testing. The patient was advised that if she develops new symptoms prior to surgery to contact our office to arrange for a follow-up visit, and she verbalized understanding.   [name] perioperative risk of a major cardiac event is  % according to the Revised Cardiac Risk Index (RCRI).  Therefore, she is at low risk for perioperative complications.   Her functional capacity is fair at 4.31 METs according to the [place] Activity Status Index (DASI). Recommendations: According to ACC/AHA guidelines, no further cardiovascular testing needed.  The patient may proceed to surgery at acceptable risk.   Antiplatelet and/or Anticoagulation Recommendations: Clopidogrel (Plavix) will be addressed once formal request submitted.   2.  Nonobstructive CAD: -R/LHC performed 2019 with no evidence of coronary artery disease. -Today patient reports no chest pain or shortness of breath -Continue GDMT with Lipitor 40 mg, Plavix 75 mg,  3.  Chronic diastolic CHF: -2D echo was completed on 08/2021 with an EF of 60-65%, no RWMA, normal RV function, mild to moderate MV regurgitation, mild AV regurgitation with gradient of 9.0 mmHg. -Today patient is euvolemic on examination -The losartan 100 mg daily -Low sodium diet, fluid restriction <2L, and daily weights encouraged. Educated to contact our office for weight gain of 2 lbs  overnight or 5 lbs in one week.    4.  History of CVA: -s/p subacute right occipital lobe, 07/2017 -Patient had ZIO monitor completed with no evidence of atrial fibrillation -Continue GDMT with Lipitor 40 mg and Plavix 75 mg  5.  Hx of Aortic Valve Replacement: -s/p  AVR on 08/2016 -Today patient is euvolemic on exam -She reports no angina or HF symptoms -SBE prophylaxis discussed for dental procedures       Disposition: Follow-up with Hayley Bergeron, MD or APP in 6 months    Medication Adjustments/Labs and Tests Ordered: Current medicines are reviewed at length with the patient today.  Concerns regarding medicines are outlined above.   Signed, Mable Fill, Marissa Nestle, NP 03/21/2022, 8:29 AM [facility]  Note:  This document was prepared using Dragon voice recognition software and may include unintentional dictation errors.

## 2022-03-21 ENCOUNTER — Encounter: Payer: Self-pay | Admitting: Nurse Practitioner

## 2022-03-21 ENCOUNTER — Ambulatory Visit: Payer: Medicare PPO | Attending: Cardiology | Admitting: Nurse Practitioner

## 2022-03-21 ENCOUNTER — Ambulatory Visit: Payer: Medicare PPO | Admitting: Cardiology

## 2022-03-21 VITALS — BP 116/58 | HR 62 | Ht 66.0 in | Wt 196.0 lb

## 2022-03-21 DIAGNOSIS — I5032 Chronic diastolic (congestive) heart failure: Secondary | ICD-10-CM

## 2022-03-21 DIAGNOSIS — Z01810 Encounter for preprocedural cardiovascular examination: Secondary | ICD-10-CM

## 2022-03-21 DIAGNOSIS — Z8673 Personal history of transient ischemic attack (TIA), and cerebral infarction without residual deficits: Secondary | ICD-10-CM

## 2022-03-21 DIAGNOSIS — I2510 Atherosclerotic heart disease of native coronary artery without angina pectoris: Secondary | ICD-10-CM | POA: Diagnosis not present

## 2022-03-21 DIAGNOSIS — Z953 Presence of xenogenic heart valve: Secondary | ICD-10-CM | POA: Diagnosis not present

## 2022-03-21 NOTE — Patient Instructions (Signed)
Medication Instructions:  Your physician recommends that you continue on your current medications as directed. Please refer to the Current Medication list given to you today.  *If you need a refill on your cardiac medications before your next appointment, please call your pharmacy*   Lab Work: NONE If you have labs (blood work) drawn today and your tests are completely normal, you will receive your results only by: [facility] (if you have MyChart) OR A paper copy in the mail If you have any lab test that is abnormal or we need to change your treatment, we will call you to review the results.   Testing/Procedures: NONE   Follow-Up: At [facility], you and your health needs are our priority.  As part of our continuing mission to provide you with exceptional heart care, we have created designated Provider Care Teams.  These Care Teams include your primary Cardiologist (physician) and Advanced Practice Providers (APPs -  Physician Assistants and Nurse Practitioners) who all work together to provide you with the care you need, when you need it.   Your next appointment:   6 month(s)  The format for your next appointment:   In Person  Provider:   Freada Bergeron, MD      Important Information About Sugar

## 2022-03-25 DIAGNOSIS — L910 Hypertrophic scar: Secondary | ICD-10-CM | POA: Diagnosis not present

## 2022-03-28 DIAGNOSIS — J3089 Other allergic rhinitis: Secondary | ICD-10-CM | POA: Diagnosis not present

## 2022-03-28 DIAGNOSIS — J300 Vasomotor rhinitis: Secondary | ICD-10-CM | POA: Diagnosis not present

## 2022-03-28 DIAGNOSIS — K219 Gastro-esophageal reflux disease without esophagitis: Secondary | ICD-10-CM | POA: Diagnosis not present

## 2022-03-28 DIAGNOSIS — R519 Headache, unspecified: Secondary | ICD-10-CM | POA: Diagnosis not present

## 2022-04-02 DIAGNOSIS — M7062 Trochanteric bursitis, left hip: Secondary | ICD-10-CM | POA: Diagnosis not present

## 2022-04-02 DIAGNOSIS — M5416 Radiculopathy, lumbar region: Secondary | ICD-10-CM | POA: Diagnosis not present

## 2022-04-03 ENCOUNTER — Encounter: Payer: Self-pay | Admitting: Family Medicine

## 2022-04-03 ENCOUNTER — Ambulatory Visit (INDEPENDENT_AMBULATORY_CARE_PROVIDER_SITE_OTHER): Payer: Medicare PPO | Admitting: Family Medicine

## 2022-04-03 VITALS — BP 136/60 | HR 65 | Temp 97.6°F | Ht 66.0 in | Wt 195.4 lb

## 2022-04-03 DIAGNOSIS — M85852 Other specified disorders of bone density and structure, left thigh: Secondary | ICD-10-CM | POA: Diagnosis not present

## 2022-04-03 DIAGNOSIS — E559 Vitamin D deficiency, unspecified: Secondary | ICD-10-CM | POA: Diagnosis not present

## 2022-04-03 DIAGNOSIS — E785 Hyperlipidemia, unspecified: Secondary | ICD-10-CM | POA: Diagnosis not present

## 2022-04-03 DIAGNOSIS — M85851 Other specified disorders of bone density and structure, right thigh: Secondary | ICD-10-CM | POA: Diagnosis not present

## 2022-04-03 DIAGNOSIS — G4733 Obstructive sleep apnea (adult) (pediatric): Secondary | ICD-10-CM | POA: Diagnosis not present

## 2022-04-03 DIAGNOSIS — E1169 Type 2 diabetes mellitus with other specified complication: Secondary | ICD-10-CM | POA: Diagnosis not present

## 2022-04-03 DIAGNOSIS — E119 Type 2 diabetes mellitus without complications: Secondary | ICD-10-CM

## 2022-04-03 DIAGNOSIS — Z0000 Encounter for general adult medical examination without abnormal findings: Secondary | ICD-10-CM

## 2022-04-03 LAB — CBC WITH DIFFERENTIAL/PLATELET
Basophils Absolute: 0.1 10*3/uL (ref 0.0–0.1)
Basophils Relative: 1.1 % (ref 0.0–3.0)
Eosinophils Absolute: 0.1 10*3/uL (ref 0.0–0.7)
Eosinophils Relative: 1.2 % (ref 0.0–5.0)
HCT: 35.9 % — ABNORMAL LOW (ref 36.0–46.0)
Hemoglobin: 11.5 g/dL — ABNORMAL LOW (ref 12.0–15.0)
Lymphocytes Relative: 30.3 % (ref 12.0–46.0)
Lymphs Abs: 2.1 10*3/uL (ref 0.7–4.0)
MCHC: 32.1 g/dL (ref 30.0–36.0)
MCV: 81.5 fl (ref 78.0–100.0)
Monocytes Absolute: 0.7 10*3/uL (ref 0.1–1.0)
Monocytes Relative: 10 % (ref 3.0–12.0)
Neutro Abs: 4 10*3/uL (ref 1.4–7.7)
Neutrophils Relative %: 57.4 % (ref 43.0–77.0)
Platelets: 331 10*3/uL (ref 150.0–400.0)
RBC: 4.4 Mil/uL (ref 3.87–5.11)
RDW: 15 % (ref 11.5–15.5)
WBC: 7 10*3/uL (ref 4.0–10.5)

## 2022-04-03 LAB — COMPREHENSIVE METABOLIC PANEL
ALT: 13 U/L (ref 0–35)
AST: 24 U/L (ref 0–37)
Albumin: 4.4 g/dL (ref 3.5–5.2)
Alkaline Phosphatase: 118 U/L — ABNORMAL HIGH (ref 39–117)
BUN: 22 mg/dL (ref 6–23)
CO2: 29 mEq/L (ref 19–32)
Calcium: 9.2 mg/dL (ref 8.4–10.5)
Chloride: 98 mEq/L (ref 96–112)
Creatinine, Ser: 0.57 mg/dL (ref 0.40–1.20)
GFR: 91.47 mL/min (ref >60.00)
Glucose, Bld: 120 mg/dL — ABNORMAL HIGH (ref 70–99)
Potassium: 4.3 mEq/L (ref 3.5–5.1)
Sodium: 132 mEq/L — ABNORMAL LOW (ref 135–145)
Total Bilirubin: 0.5 mg/dL (ref 0.2–1.2)
Total Protein: 6.9 g/dL (ref 6.0–8.3)

## 2022-04-03 LAB — MICROALBUMIN / CREATININE URINE RATIO
Creatinine,U: 114.3 mg/dL
Microalb Creat Ratio: 1.2 mg/g (ref 0.0–30.0)
Microalb, Ur: 1.4 mg/dL (ref 0.0–1.9)

## 2022-04-03 LAB — LDL CHOLESTEROL, DIRECT: Direct LDL: 67 mg/dL

## 2022-04-03 NOTE — Progress Notes (Signed)
Phone [phone]   Subjective:  Patient presents today for their annual physical. Chief complaint-noted.   See problem oriented charting- ROS- full  review of systems was completed and negative except for: working with Dr. Ron Agee on left hip bursitis, back pain- doing better after injections, unchanged resting tremor  The following were reviewed and entered/updated in epic: Past Medical History:  Diagnosis Date   Allergy    Anxiety    Arthritis    back & knee   Asthma     mild per pt shows up with resp illness   Colon polyps    Constipation    Diabetes mellitus without complication (HCC)    Gallstones    Gastric polyps    Gastroparesis    GERD (gastroesophageal reflux disease)    Headache    sinus headaches and migraines at times   Heart murmur    History of migraine headaches    HTN (hypertension)    Hyperlipidemia    IBS (irritable bowel syndrome)    Joint pain    Lumbar disc disease    PONV (postoperative nausea and vomiting)    S/P aortic valve replacement with bioprosthetic valve 08/23/2016   25 mm Edwards Intuity rapid-deployment bovine pericardial tissue valve via partial upper mini sternotomy   Sleep apnea    does not use CPAP   TIA (transient ischemic attack)    TIA (transient ischemic attack)    hx of per pt   Patient Active Problem List   Diagnosis Date Noted   History of CVA in adulthood 08/13/2017    Priority: High   S/P minimally invasive aortic valve replacement with bioprosthetic valve 08/23/2016    Priority: High   Diastolic dysfunction     Priority: High   Diabetes mellitus ([name]) 05/12/2014    Priority: High   Vitamin D deficiency 06/04/2018    Priority: Medium    Hyponatremia 02/17/2018    Priority: Medium    Primary hypertension 01/02/2017    Priority: Medium    Osteopenia 05/27/2016    Priority: Medium    OSA (obstructive sleep apnea) 02/27/2016    Priority: Medium    Elevated alkaline phosphatase level 05/16/2015    Priority:  Medium    GERD (gastroesophageal reflux disease) 03/10/2014    Priority: Medium    Generalized anxiety disorder 03/10/2014    Priority: Medium    Migraines 03/10/2014    Priority: Medium    Irritable bowel syndrome 12/15/2008    Priority: Medium    Hyperlipidemia associated with type 2 diabetes mellitus ([place]) 02/09/2008    Priority: Medium    Blurry vision 08/13/2017    Priority: Low   Bruxism 10/23/2015    Priority: Low   Atypical chest pain 11/10/2014    Priority: Low   Acne 05/12/2014    Priority: Low   EUSTACHIAN TUBE DYSFUNCTION, CHRONIC 06/14/2010    Priority: Low   Low back pain 08/11/2008    Priority: Low   Allergic rhinitis 02/12/2007    Priority: Low   Asthma 02/12/2007    Priority: Low   S/P reverse total shoulder arthroplasty, left 02/13/2021    Priority: 1.   SOB (shortness of breath) 12/10/2021   Idiopathic hyperphosphatasia 08/03/2019   Past Surgical History:  Procedure Laterality Date   ABDOMINAL HYSTERECTOMY     AORTIC VALVE REPLACEMENT N/A 08/23/2016   Procedure: AORTIC VALVE REPLACEMENT (AVR) - using partial Upper Sternotomy- 47m Edwards Intuity Aortic Valve used;  Surgeon: CRexene Alberts MD;  Location: [facility];  Service: Open Heart Surgery;  Laterality: N/A;   BLADDER SUSPENSION  2007   BREAST ENHANCEMENT SURGERY  1980   CARPAL TUNNEL RELEASE Bilateral    COLONOSCOPY     DORSAL COMPARTMENT RELEASE Right 09/15/2014   Procedure: RIGHT WRIST DEQUERVAINS RELEASE ;  Surgeon: Kathryne Hitch, MD;  Location: [place];  Service: Orthopedics;  Laterality: Right;   DRUG INDUCED ENDOSCOPY N/A 02/19/2022   Procedure: DRUG INDUCED SLEEP ENDOSCOPY;  Surgeon: Jerrell Belfast, MD;  Location: [place];  Service: ENT;  Laterality: N/A;   ESOPHAGOGASTRODUODENOSCOPY     EYE SURGERY     cataract surgery   KNEE ARTHROSCOPY  04/15/2012   Procedure: ARTHROSCOPY KNEE;  Surgeon: [name], MD;  Location: [facility];  Service: Orthopedics;   Laterality: Right;   LAPAROSCOPIC CHOLECYSTECTOMY     LASIK     OOPHORECTOMY     PALATE TO GINGIVA GRAFT  2017   REVERSE SHOULDER ARTHROPLASTY Left 02/13/2021   Procedure: REVERSE SHOULDER ARTHROPLASTY;  Surgeon: Marchia Bond, MD;  Location: [facility];  Service: Orthopedics;  Laterality: Left;   RIGHT/LEFT HEART CATH AND CORONARY ANGIOGRAPHY N/A 08/02/2016   Procedure: Right/Left Heart Cath and Coronary Angiography;  Surgeon: Sherren Mocha, MD;  Location: [facility];  Service: Cardiovascular;  Laterality: N/A;   ROTATOR CUFF REPAIR Left    TEE WITHOUT CARDIOVERSION N/A 08/23/2016   Procedure: TRANSESOPHAGEAL ECHOCARDIOGRAM (TEE);  Surgeon: Rexene Alberts, MD;  Location: [facility];  Service: Open Heart Surgery;  Laterality: N/A;   TOTAL KNEE ARTHROPLASTY  04/15/2012   Procedure: TOTAL KNEE ARTHROPLASTY;  Surgeon: [name], MD;  Location: [facility];  Service: Orthopedics;  Laterality: Right;   TRIGGER FINGER RELEASE Right 04/20/2015   Procedure: RIGHT TRIGGER FINGER RELEASE (TENDON SHEALTH INCISION) ;  Surgeon: [name], MD;  Location: [facility];  Service: Orthopedics;  Laterality: Right;    Family History  Problem Relation Age of Onset   COPD Mother    Colon polyps Mother    Irritable bowel syndrome Mother    Anxiety disorder Mother    Heart disease Father    Alcohol abuse Father    Breast cancer Maternal Grandmother    Gallbladder disease Maternal Grandmother    Colon polyps Maternal Aunt    Diabetes Paternal Uncle        several uncles   Other Neg Hx        hyponatremia   Colon cancer Neg Hx    Esophageal cancer Neg Hx    Rectal cancer Neg Hx    Stomach cancer Neg Hx     Medications- reviewed and updated Current Outpatient Medications  Medication Sig Dispense Refill   ACCU-CHEK GUIDE test strip USE TO TEST BLOOD SUGARS DAILY. DX: E11.9 100 strip 12   Accu-Chek Softclix Lancets lancets USE AS INSTRUCTED 100 each 12   Acetaminophen Extra Strength  500 MG TABS SMARTSIG:1 By Mouth     amLODipine (NORVASC) 10 MG tablet Take 1 tablet (10 mg total) by mouth daily. 90 tablet 3   atorvastatin (LIPITOR) 40 MG tablet TAKE 1 TABLET BY MOUTH DAILY AT 6 PM. 90 tablet 3   Biotin 5 MG TBDP SMARTSIG:1 By Mouth     blood glucose meter kit and supplies KIT Dispense based on patient and insurance preference. Use up to four times daily as directed. Dx: E11.9 1 each 0   calcium-vitamin D (OSCAL WITH D) 500-200  MG-UNIT per tablet Take 2 tablets by mouth daily.      cloNIDine (CATAPRES) 0.1 MG tablet Take by mouth.     clopidogrel (PLAVIX) 75 MG tablet TAKE 1 TABLET BY MOUTH EVERY DAY 90 tablet 3   D-5000 125 MCG (5000 UT) TABS SMARTSIG:1 Tablet(s) By Mouth     demeclocycline (DECLOMYCIN) 300 MG tablet Take 1 tablet (300 mg total) by mouth 2 (two) times daily. 180 tablet 3   fluticasone (FLONASE) 50 MCG/ACT nasal spray Place 2 sprays into both nostrils daily.     ipratropium (ATROVENT) 0.03 % nasal spray 2 SPRAYS IN EACH NOSTRIL AS NEEDED THREE TIMES AS NEEDED     irbesartan (AVAPRO) 300 MG tablet Take 1 tablet (300 mg total) by mouth daily. 90 tablet 3   loratadine (CLARITIN) 10 MG tablet Take 10 mg by mouth at bedtime.      Melatonin 5 MG TABS Take 5 mg by mouth at bedtime as needed (sleep).     metFORMIN (GLUCOPHAGE) 500 MG tablet Take 1 tablet (500 mg total) by mouth daily with breakfast. 180 tablet 1   metoCLOPramide (REGLAN) 10 MG tablet TAKE 1/2 TABLET BY MOUTH 4 TIMES A DAY 60 tablet 3   metroNIDAZOLE (METROCREAM) 0.75 % cream      Multiple Vitamins-Minerals (MULTIVITAMIN ADULTS) TABS SMARTSIG:1 By Mouth     omeprazole (PRILOSEC) 40 MG capsule Take 1 capsule by mouth daily.     Probiotic Product (ALIGN) 4 MG CAPS Take 4 mg by mouth daily.      psyllium (METAMUCIL SMOOTH TEXTURE) 28 % packet Take 1 packet by mouth 2 (two) times daily.     sodium chloride 1 g tablet Take 3 g by mouth 2 (two) times daily with a meal.     venlafaxine XR (EFFEXOR-XR) 75  MG 24 hr capsule TAKE 1 CAPSULE BY MOUTH DAILY WITH BREAKFAST. 90 capsule 2   No current facility-administered medications for this visit.    Allergies-reviewed and updated Allergies  Allergen Reactions   Augmentin [Amoxicillin-Pot Clavulanate] Rash   Erythromycin Nausea Only   Penicillins Itching and Rash    Has patient had a PCN reaction causing immediate rash, facial/tongue/throat swelling, SOB or lightheadedness with hypotension: No Has patient had a PCN reaction causing severe rash involving mucus membranes or skin necrosis: No Has patient had a PCN reaction that required hospitalization: No Has patient had a PCN reaction occurring within the last 10 years: No  If all of the above answers are "NO", then may proceed with Cephalosporin use. Tolerated Ancef 02/13/21    Social History   Social History Narrative   She lives with husband (1978) and two dogs. Step-son [name] (1971-psychiatrist in [place]). No grandkids. 2 dogs-sheltie/collie mix and border collie mix      Highest level of education:  Master in education   She is retired Animal nutritionist x 32 years.      Hobbies: time with dogs   Right handed   One story home         Objective  Objective:  BP 136/60   Pulse 65   Temp 97.6 F (36.4 C)   Ht _0  (1.676 m)   Wt 195 lb 6.4 oz (88.6 kg)   LMP  (LMP Unknown)   SpO2 100%   BMI 31.54 kg/m  Gen: NAD, resting comfortably HEENT: Mucous membranes are moist. Oropharynx normal Neck: no thyromegaly CV: RRR no murmurs rubs or gallops Lungs: CTAB no crackles, wheeze, rhonchi Abdomen:  soft/nontender/nondistended/normal bowel sounds. No rebound or guarding.  Ext: no edema Skin: warm, dry Neuro: grossly normal, moves all extremities, PERRLA, unchanged resting tremor   Assessment and Plan   71 y.o. female presenting for annual physical.  Health Maintenance counseling: 1. Anticipatory guidance: Patient counseled regarding regular dental exams -q6 months, eye  exams - yearly in november,  avoiding smoking and second hand smoke , limiting alcohol to 1 beverage per day- doesn't drink , no illicit drugs .   2. Risk factor reduction:  Advised patient of need for regular exercise and diet rich and fruits and vegetables to reduce risk of heart attack and stroke.  Exercise- down from 3-4 days a week to 0 right now due to some back issues. Needs hip to heal as wel Diet/weight management-ongoing great job working with healthy weight to wellness- ongoing gradual weight loss.  Wt Readings from Last 3 Encounters:  04/03/22 195 lb 6.4 oz (88.6 kg)  03/21/22 196 lb (88.9 kg)  03/05/22 188 lb (85.3 kg)  3. Immunizations/screenings/ancillary studies- consider prevnar 20 in 2027. Had flu shot. Considering covid shot. RSV discussed- reasonable to do at phramacy.  Immunization History  Administered Date(s) Administered   Fluad Quad(high Dose 65+) 03/10/2022   Influenza Split 04/04/2011, 03/05/2012   Influenza Whole 04/16/2007, 03/16/2009, 03/13/2010   Influenza, High Dose Seasonal PF 01/31/2019, 02/27/2020, 02/28/2020, 03/27/2021, 03/10/2022   Influenza,inj,Quad PF,6+ Mos 03/30/2013, 03/10/2014, 03/23/2015, 02/27/2016   Influenza-Unspecified 02/27/2017, 02/28/2017, 01/31/2018   PFIZER Comirnaty(Gray Top)Covid-19 Tri-Sucrose Vaccine 11/17/2020   PFIZER(Purple Top)SARS-COV-2 Vaccination 08/15/2019, 09/08/2019, 03/31/2020   Pneumococcal Conjugate-13 05/21/2016   Pneumococcal Polysaccharide-23 05/23/2017, 03/27/2021   Td 06/24/1996, 08/06/2007, 11/07/2017   Tdap 11/07/2017   Zoster Recombinat (Shingrix) 01/21/2019, 03/24/2019   Zoster, Live 05/12/2014   4. Cervical cancer screening- followed by Dr. Ouida Sills gyn Jake Shark this visit)- no abnormal pap history. Past age based screening requirements 5. Breast cancer screening-  breast exam  With GYN and mammogram 09/27/21 6. Colon cancer screening - due 2026 with last 2016 with Dr. [name]  7. Skin cancer screening-  Dr. Elvera Lennox yearly in march. advised regular sunscreen use. 8. Birth control/STD check-postmenopausal/monogamous 9. Osteoporosis screening at 78-  osteopenia 05/2018 actually improved - we are planning on repeat next year -Never smoker   Status of chronic or acute concerns   #SIADH- has seen Dr. Loanne Drilling- on demeclocycline and also on sodium and fluid restrictions #Alkaline phosphatase- monitored per endocrine with no further workup per Dr. Loanne Drilling but will be getting evaluation by Dr. Jasmine December soon for both   #History of stroke-remains on Plavix long-term   %s/p aortic valve replacement with bioprosthetic valve- requires dental prophylaxis. Follows with Dr. Meda Coffee. Catheterization 08/02/16 with minor nonobstructive CAD. Had clearance for surgery  % OSA- remains on CPAP with Dr. Halford Chessman #with [facility] upcoming hypoglossal nerve stimulator to treat sleep apnea  # Diabetes S: compliant with metformin 500mg  BID in past- now down to 500 AM only -sguar 135 this am Lab Results  Component Value Date   HGBA1C 6.3 (H) 03/05/2022   HGBA1C 6.3 09/24/2021   HGBA1C 6.6 (H) 06/05/2021   A/P: Controlled. Continue current medications.   #hypertension/diastolic dysfunction (prior listed as CHF but no chronic lasix need and no hospitalization for HF) S:medication: amlodipine 10mg , irbesartan 300mg ,  now on clonidine 0.1mg  nightly to see if helps with sweating issues- some help - Prior on metoprolol 12.5mg  BID and lasix BP Readings from Last 3 Encounters:  04/03/22 136/60  03/21/22 (!) 116/58  03/05/22 108/68  A/P: Controlled. Continue current medications.    #hyperlipidemia/history of stroke S: for lipids compliant with  atorvastatin 7m.   Patient also compliant with stroke history- plavix  75 mg Lab Results  Component Value Date   CHOL 144 06/05/2021   HDL 64 06/05/2021   LDLCALC 65 06/05/2021   LDLDIRECT 58.0 11/25/2017   TRIG 75 06/05/2021   CHOLHDL 2.3 06/05/2021   A/P: lipids at ideal  control last check- check direct LDL -continue plavix  # generalized anxiety S: patient is compliant with venlafaxine 732m  We are using lower dose as had more hyponatremia in the past- possible SIADH.  A/P: overall stable- continue current meds   # Migraines - follows with Dr. JaTomi Likens:patient with history of stroke- sumatriptan was stopped due to stroke history. Venlafaxine mentioned for migraine prophylaxis. PRN treatment with ibuprofen or execrin with reglan per Dr. JaTomi Likensthought lower risk than sumatriptan  -no recent issues A/P: doing well- monitor  # Asthma/allergic rhinitis- Dr. VaHarold Hedge: doing well  recently with prn albuterol only. Compliant with claritin, astenlin, nasonex, saline. 2020 stopped allergy shots A/P: overall stable- continue current meds for both issues   # Low back pain/degenerative disc disease S: intermittent issues- has been better with weight loss. Should avoid nsaids with plavix ideally  A/P: overall stable- intermittent issues- continue current meds   # GERD S:Dr. PeHenrene Pastoras advised higher dose omeprazole at 4078mer patient so she wants to continue this dose- compliant  with this  A/P: no issues- continue current meds   #Vitamin D deficiency S: Medication: thinks 1000 units otc Last vitamin D Lab Results  Component Value Date   VD25OH 62.2 03/05/2022  A/P: Controlled. Continue current medications.    Recommended follow up: Return in about 6 months (around 10/03/2022) for followup or sooner if needed.Schedule b4 you leave. Future Appointments  Date Time Provider DepBirmingham0/05/2022 12:30 PM DanEsaw GrandchildP [facility] None  04/22/2022  2:40 PM GhePhilemon KingdomD [facility] None  08/27/2022  3:00 PM [facility]  09/19/2022  9:40 AM PemFreada BergeronD [facility]  10/14/2022  2:30 PM JafPieter PartridgeO [facility] None   Lab/Order associations: fasting   ICD-10-CM   1. Preventative  health care  Z00.00     2. Vitamin D deficiency  E55.9     3. Osteopenia of both thighs  M85.851 DG Bone Density   M85.852     4. Type 2 diabetes mellitus without complication, unspecified whether long term insulin use (HCC)  E11.9 Microalbumin / creatinine urine ratio    CBC with Differential/Platelet    Comprehensive metabolic panel    LDL cholesterol, direct    5. Hyperlipidemia associated with type 2 diabetes mellitus (HCC)  E11.69 LDL cholesterol, direct   E78.5       No orders of the defined types were placed in this encounter.   Return precautions advised.  SteGarret ReddishD

## 2022-04-03 NOTE — Patient Instructions (Addendum)
Health Maintenance Due  Topic Date Due   Diabetic kidney evaluation - Urine ACR - today 01/05/2020   COVID-19 Vaccine (5 - Pfizer risk series)- consider at pharmacy along with RSV 01/12/2021   Schedule your bone density test at check out desk.  - located [address] across the street from [place] - in the basement - you DO NEED an appointment for the bone density tests.   Please stop by lab before you go If you have mychart- we will send your results within 3 business days of [name] receiving them.  If you do not have mychart- we will call you about results within 5 business days of [name] receiving them.  *please also note that you will see labs on mychart as soon as they post. I will later go in and write notes on them- will say "notes from Dr. Yong Channel"   Recommended follow up: Return in about 6 months (around 10/03/2022) for followup or sooner if needed.Schedule b4 you leave.

## 2022-04-04 ENCOUNTER — Encounter (INDEPENDENT_AMBULATORY_CARE_PROVIDER_SITE_OTHER): Payer: Self-pay | Admitting: Adult Health

## 2022-04-04 ENCOUNTER — Other Ambulatory Visit: Payer: Self-pay

## 2022-04-04 ENCOUNTER — Ambulatory Visit (INDEPENDENT_AMBULATORY_CARE_PROVIDER_SITE_OTHER): Payer: Medicare PPO | Admitting: Adult Health

## 2022-04-04 VITALS — BP 136/54 | HR 66 | Temp 98.8°F | Ht 66.0 in | Wt 191.0 lb

## 2022-04-04 DIAGNOSIS — Z7984 Long term (current) use of oral hypoglycemic drugs: Secondary | ICD-10-CM

## 2022-04-04 DIAGNOSIS — E871 Hypo-osmolality and hyponatremia: Secondary | ICD-10-CM | POA: Diagnosis not present

## 2022-04-04 DIAGNOSIS — E669 Obesity, unspecified: Secondary | ICD-10-CM | POA: Diagnosis not present

## 2022-04-04 DIAGNOSIS — D649 Anemia, unspecified: Secondary | ICD-10-CM

## 2022-04-04 DIAGNOSIS — Z8673 Personal history of transient ischemic attack (TIA), and cerebral infarction without residual deficits: Secondary | ICD-10-CM

## 2022-04-04 DIAGNOSIS — E1169 Type 2 diabetes mellitus with other specified complication: Secondary | ICD-10-CM

## 2022-04-04 DIAGNOSIS — Z6830 Body mass index (BMI) 30.0-30.9, adult: Secondary | ICD-10-CM | POA: Diagnosis not present

## 2022-04-10 ENCOUNTER — Other Ambulatory Visit: Payer: Self-pay | Admitting: Otolaryngology

## 2022-04-12 ENCOUNTER — Ambulatory Visit (INDEPENDENT_AMBULATORY_CARE_PROVIDER_SITE_OTHER)
Admission: RE | Admit: 2022-04-12 | Discharge: 2022-04-12 | Disposition: A | Discharge status: Home / Self Care | Payer: Medicare PPO | Source: Ambulatory Visit | Attending: Family Medicine | Admitting: Family Medicine

## 2022-04-12 DIAGNOSIS — M85851 Other specified disorders of bone density and structure, right thigh: Secondary | ICD-10-CM

## 2022-04-12 DIAGNOSIS — M85852 Other specified disorders of bone density and structure, left thigh: Secondary | ICD-10-CM | POA: Diagnosis not present

## 2022-04-15 ENCOUNTER — Encounter (HOSPITAL_BASED_OUTPATIENT_CLINIC_OR_DEPARTMENT_OTHER): Payer: Self-pay | Admitting: Otolaryngology

## 2022-04-15 DIAGNOSIS — M7062 Trochanteric bursitis, left hip: Secondary | ICD-10-CM | POA: Diagnosis not present

## 2022-04-15 NOTE — Progress Notes (Signed)
Chief Complaint:   OBESITY [name] is here to discuss her progress with her obesity treatment plan along with follow-up of her obesity related diagnoses. [name] is on the Category 2 Plan and states she is following her eating plan approximately 70% of the time. [name] states she is doing a simple workout at home 15-20 minutes 3 times per week.  Today's visit was #: 24 Starting weight: 220 lbs Starting date: 01/19/2018 Today's weight: 191 lbs Today's date: 04/04/2022 Total lbs lost to date: 29 lbs Total lbs lost since last in-office visit: +3 lbs  Interim History: [name] is pleased to have lost 30 lbs and wants to focus on losing abdominal adiposity.   [name] really enjoys-Dannon fit and light orange cream -12 grams of protein per serving.  Subjective:   1. History of CVA in adulthood 08/12/2017 PCP OV Notes: "[name] is a 71 y.o. year old very pleasant female patient who presents for/with See problem oriented charting ROS- No facial or extremity weakness. No slurred words or trouble swallowing. has blurry vision. No  double vision. No paresthesias. No confusion or word finding difficulties. A complete review of systems was completed and was neagative except for headache and "aura"/blurry vision  Occipital subacute stroke S: she is on day 5 of a migraine. Usually gets aura and 45 minutes later takes imitex and then aura goes away and gets nausea/headache and takes second imitrex and gets some rest and headache goes away.    Aura has lasted for 5 days- with her aura gets little splotches- that has persisted for entire. Sumatriptan did not help. - has taken everyday of the headache.    Wakes up each AM and thinks better then gets worse. Pain is located on right side of forehead mainly but ca be on left as well A/P: 71 year old with DM, HTN, HLD, bovine aortic valve not needing anticoagulation but on aspirin at baseline who presented with prolonged  migraine with prolonged perceived aura. Did CT scan to more quickly evaluate for stroke or bleed- unfortunately a subacute stroke in occipital lobe on right side was found - will get MRI/MRA without contrast -get updated echocardiogram. Would need anticoagulation if positive for thrombus -get carotid duplex evaluation - start plavix 75 mg daily- stop aspirin - refer to neurolgoy   -Advised to stop relafen due to acute CVA, also to stop imitrex- can discuss alternates with neurology -Flu test was negative (done by nursing staff due to prolonged headache)   Migraines Sumatriptan- stop due to stroke - had planned on possible toradol if no acute bleed and then prednisone. Told patient she could try the prednisone but without abortive therapy not sure it will help"    04/03/22 LDL 67- completed by PCP. She is currently on Lipitor 40 mg and Plavix 75 mg daily.   2. Hyponatremia Dr Loanne Drilling started her Declomycin BID to increase her serum Na+ level. She denies change in mental status ar present.  3. Type 2 diabetes mellitus with other specified complication, without long-term current use of insulin (HCC) Lab Results  Component Value Date   HGBA1C 6.3 (H) 03/05/2022   HGBA1C 6.3 09/24/2021   HGBA1C 6.6 (H) 06/05/2021    She is currently on metformin 500 mg, managed by PCP.    Assessment/Plan:   1. History of CVA in adulthood Continue Lipitor 40 mg and Plavix 75 mg daily.   2. Hyponatremia Follow up with Dr. Gherge/Endocrinology at the end of  the month.   3. Type 2 diabetes mellitus with other specified complication, without long-term current use of insulin ([place]) Follow up with Dr. Gherge/Endocrinology at end of the month.   4. Obesity, current BMI 30.9  [name] is currently in the action stage of change. As such, her goal is to continue with weight loss efforts. She has agreed to the Category 2 Plan.   Exercise goals:  As is.   Behavioral modification strategies: increasing  lean protein intake, decreasing simple carbohydrates, meal planning and cooking strategies, keeping healthy foods in the home, and planning for success.  [name] has agreed to follow-up with our clinic in 4 weeks. She was informed of the importance of frequent follow-up visits to maximize her success with intensive lifestyle modifications for her multiple health conditions.   Objective:   Blood pressure (!) 136/54, pulse 66, temperature 98.8 F (37.1 C), height '5\' 6"'$  (1.676 m), weight 191 lb (86.6 kg), SpO2 99 %. Body mass index is 30.83 kg/m.  General: Cooperative, alert, well developed, in no acute distress. HEENT: Conjunctivae and lids unremarkable. Cardiovascular: Regular rhythm.  Lungs: Normal work of breathing. Neurologic: No focal deficits.   Lab Results  Component Value Date   CREATININE 0.57 04/03/2022   BUN 22 04/03/2022   NA 132 (L) 04/03/2022   K 4.3 04/03/2022   CL 98 04/03/2022   CO2 29 04/03/2022   Lab Results  Component Value Date   ALT 13 04/03/2022   AST 24 04/03/2022   ALKPHOS 118 (H) 04/03/2022   BILITOT 0.5 04/03/2022   Lab Results  Component Value Date   HGBA1C 6.3 (H) 03/05/2022   HGBA1C 6.3 09/24/2021   HGBA1C 6.6 (H) 06/05/2021   HGBA1C 6.5 (H) 02/09/2021   HGBA1C 6.3 (H) 01/25/2021   Lab Results  Component Value Date   INSULIN 6.3 03/05/2022   INSULIN 12.9 06/05/2021   INSULIN 7.7 01/25/2021   INSULIN 13.1 04/20/2020   INSULIN 12.1 01/19/2018   Lab Results  Component Value Date   TSH 4.23 09/17/2021   Lab Results  Component Value Date   CHOL 144 06/05/2021   HDL 64 06/05/2021   LDLCALC 65 06/05/2021   LDLDIRECT 67.0 04/03/2022   TRIG 75 06/05/2021   CHOLHDL 2.3 06/05/2021   Lab Results  Component Value Date   VD25OH 62.2 03/05/2022   VD25OH 70.1 06/05/2021   VD25OH 74.5 01/25/2021   Lab Results  Component Value Date   WBC 7.0 04/03/2022   HGB 11.5 (L) 04/03/2022   HCT 35.9 (L) 04/03/2022   MCV 81.5 04/03/2022   PLT  331.0 04/03/2022   No results found for: "IRON", "TIBC", "FERRITIN"  Attestation Statements:   Reviewed by clinician on day of visit: allergies, medications, problem list, medical history, surgical history, family history, social history, and previous encounter notes.  I, Davy Pique, RMA, am acting as Location manager for Mina Marble, NP.  I have reviewed the above documentation for accuracy and completeness, and I agree with the above. -  Vanda Waskey d. Reagen Haberman, NP-C

## 2022-04-18 ENCOUNTER — Encounter (HOSPITAL_BASED_OUTPATIENT_CLINIC_OR_DEPARTMENT_OTHER)
Admission: RE | Admit: 2022-04-18 | Discharge: 2022-04-18 | Disposition: A | Discharge status: Home / Self Care | Payer: Medicare PPO | Source: Ambulatory Visit | Attending: Otolaryngology | Admitting: Otolaryngology

## 2022-04-18 DIAGNOSIS — Z01812 Encounter for preprocedural laboratory examination: Secondary | ICD-10-CM | POA: Insufficient documentation

## 2022-04-18 LAB — BASIC METABOLIC PANEL
Anion gap: 11 (ref 5–15)
BUN: 19 mg/dL (ref 8–23)
CO2: 22 mmol/L (ref 22–32)
Calcium: 9.3 mg/dL (ref 8.9–10.3)
Chloride: 101 mmol/L (ref 98–111)
Creatinine, Ser: 0.53 mg/dL (ref 0.44–1.00)
GFR, Estimated: >60 mL/min (ref >60)
Glucose, Bld: 85 mg/dL (ref 70–99)
Potassium: 4.4 mmol/L (ref 3.5–5.1)
Sodium: 134 mmol/L — ABNORMAL LOW (ref 135–145)

## 2022-04-18 NOTE — Progress Notes (Signed)

## 2022-04-19 ENCOUNTER — Encounter: Payer: Self-pay | Admitting: Family Medicine

## 2022-04-22 ENCOUNTER — Ambulatory Visit: Payer: Medicare PPO | Admitting: Internal Medicine

## 2022-04-22 ENCOUNTER — Encounter: Payer: Self-pay | Admitting: Internal Medicine

## 2022-04-22 VITALS — BP 118/82 | HR 70 | Ht 66.0 in | Wt 200.0 lb

## 2022-04-22 DIAGNOSIS — R748 Abnormal levels of other serum enzymes: Secondary | ICD-10-CM

## 2022-04-22 DIAGNOSIS — E871 Hypo-osmolality and hyponatremia: Secondary | ICD-10-CM | POA: Diagnosis not present

## 2022-04-22 NOTE — Progress Notes (Signed)
Patient ID: [name], female   DOB: [birth year], 71 y.o.   MRN: [id number]  HPI  [name] is a 71 y.o.-year-old female, returning for follow-up for hyponatremia and elevated alkaline phosphatase.  She previously saw Dr. Loanne Drilling, last visit 6 months ago.  She prepares for sleep apnea surgery Dawna Part) tomorrow  - Dr. Wilburn Cornelia.   Pt has been dx with hyponatremia in 2013.   Possibly related to SIADH per my understanding reviewing Dr. Cordelia Pen notes.   She is on: - Demeclocycline 300 mg 2x a day - NaCl tablets 3 g 2x a day  I reviewed pt's more recent sodium levels: Lab Results  Component Value Date   NA 134 (L) 04/18/2022   NA 132 (L) 04/03/2022   NA 135 02/14/2022   NA 130 (L) 09/17/2021   NA 135 06/05/2021   NA 136 02/09/2021   NA 132 (L) 10/24/2020   NA 132 (L) 08/15/2020   NA 136 06/09/2020   NA 133 (L) 04/20/2020   NA 131 (L) 12/28/2019   NA 130 (L) 11/18/2019   NA 127 (L) 08/03/2019   NA 133 (L) 06/08/2019   NA 137 02/11/2019   NA 130 (L) 01/20/2019   NA 128 (L) 01/05/2019   NA 131 (L) 06/04/2018   NA 129 (L) 03/24/2018   NA 128 (L) 02/17/2018   NA 128 (L) 02/17/2018   NA 128 (L) 02/12/2018   NA 129 (L) 01/19/2018   NA 130 (L) 11/25/2017   NA 133 (L) 05/23/2017   NA 132 (L) 04/24/2017   NA 132 (L) 01/02/2017   NA 136 09/16/2016   NA 135 08/26/2016   NA 132 (L) 08/25/2016   MRI brain (09/01/2017): 1. No acute intracranial process. 2. Subacute small RIGHT inferior occipital lobe infarct. 3. Old small cerebellar infarcts and mild chronic small vessel ischemic disease.  Per review of the chart, her hyponatremia is not associated with hypochloremia or alkalosis; she had a urine specific gravity of 1.010 at the last check in 2021.  Is is not clear how much fluid she drinks in a day. She drinks with meals 2/2 esophageal strictures.  No swelling in legs or arms.   No heart, kidney, liver dysfunction.   No hypertrigyceridemia: Lab Results   Component Value Date   CHOL 144 06/05/2021   HDL 64 06/05/2021   LDLCALC 65 06/05/2021   LDLDIRECT 67.0 04/03/2022   TRIG 75 06/05/2021   CHOLHDL 2.3 06/05/2021   Pt also has no h/o OP, but does have osteopenia - will start Fosamax per PCP, after dental evaluation.    No Br CA history.  No dizziness, no HAs, no blurry vision.   Pt. has a history of elevated alkaline phosphatase:  Reviewed her alkaline phosphatase levels: Lab Results  Component Value Date   ALKPHOS 118 (H) 04/03/2022   ALKPHOS 251 (H) 06/05/2021   ALKPHOS 149 (H) 08/15/2020   ALKPHOS 171 (H) 04/20/2020   ALKPHOS 151 (H) 11/18/2019   ALKPHOS 160 (H) 09/28/2019   ALKPHOS 192 (H) 08/03/2019   ALKPHOS 155 (H) 06/08/2019   ALKPHOS 151 (H) 02/11/2019   ALKPHOS 128 (H) 01/05/2019   ALKPHOS 151 (H) 06/04/2018   ALKPHOS 144 (H) 02/17/2018   ALKPHOS 164 (H) 01/19/2018   ALKPHOS 146 (H) 11/25/2017   ALKPHOS 138 (H) 05/23/2017   ALKPHOS 163 (H) 04/24/2017   ALKPHOS 213 (H) 09/16/2016   ALKPHOS 151 (H) 08/21/2016   ALKPHOS 156 (H) 05/14/2016   ALKPHOS 150 (  H) 11/14/2015   ALKPHOS 188 (H) 05/11/2015   ALKPHOS 200 (H) 11/09/2014   ALKPHOS 172 (H) 05/05/2014   ALKPHOS 145 (H) 12/09/2012   ALKPHOS 145 (H) 04/09/2012   ALKPHOS 119 (H) 08/06/2011   ALKPHOS 110 11/26/2010   ALKPHOS 134 (H) 06/22/2010   ALKPHOS 88 06/05/2010   ALKPHOS 103 03/06/2010   ALKPHOS 105 11/21/2009   ALKPHOS 133 (H) 07/05/2009   ALKPHOS 118 (H) 08/04/2008   ALKPHOS 122 (H) 05/03/2008   ALKPHOS 118 (H) 02/02/2008   ALKPHOS 154 (H) 08/03/2007   Had a high bone specific alk phos: 09/17/2021: Bone specific alkaline phosphatase 59  However, a bone scan (08/30/2019) was negative for Paget's disease: Multifocal degenerative changes. No evidence of acute or destructive process.  She also has a history of DM2 (controlled, on metformin only), HTN, HL, s/p Ao valve replacement, h/o TIA, GERD, gallstones, anxiety. She is taking biotin. Takes a  MVI daily, Ca-D  (1000 mg-400 units) + vitamin D (5000 units daily).  Vitamin D level was recently normal: Lab Results  Component Value Date   VD25OH 62.2 03/05/2022   VD25OH 70.1 06/05/2021   VD25OH 74.5 01/25/2021   VD25OH 66.5 04/20/2020   VD25OH 52.21 06/08/2019   VD25OH 56.90 01/05/2019   VD25OH 42.08 06/04/2018   VD25OH 27.9 (L) 01/19/2018   ROS: + see HPI  Past Medical History:  Diagnosis Date   Allergy    Anxiety    Arthritis    back & knee   Asthma     mild per pt shows up with resp illness   Colon polyps    Constipation    Diabetes mellitus without complication (HCC)    Gallstones    Gastric polyps    Gastroparesis    GERD (gastroesophageal reflux disease)    Headache    sinus headaches and migraines at times   Heart murmur    History of migraine headaches    HTN (hypertension)    Hyperlipidemia    IBS (irritable bowel syndrome)    Joint pain    Lumbar disc disease    PONV (postoperative nausea and vomiting)    S/P aortic valve replacement with bioprosthetic valve 08/23/2016   25 mm Edwards Intuity rapid-deployment bovine pericardial tissue valve via partial upper mini sternotomy   Sleep apnea    does not use CPAP   TIA (transient ischemic attack)    TIA (transient ischemic attack)    hx of per pt   Past Surgical History:  Procedure Laterality Date   ABDOMINAL HYSTERECTOMY     AORTIC VALVE REPLACEMENT N/A 08/23/2016   Procedure: AORTIC VALVE REPLACEMENT (AVR) - using partial Upper Sternotomy- 8m Edwards Intuity Aortic Valve used;  Surgeon: CRexene Alberts MD;  [facility]  Service: Open Heart Surgery;  Laterality: N/A;   BLADDER SUSPENSION  2007   BREAST ENHANCEMENT SURGERY  1980   CARPAL TUNNEL RELEASE Bilateral    COLONOSCOPY     DORSAL COMPARTMENT RELEASE Right 09/15/2014   Procedure: RIGHT WRIST DEQUERVAINS RELEASE ;  Surgeon: DKathryne Hitch MD;  [facility]  Service: Orthopedics;  Laterality: Right;   DRUG  INDUCED ENDOSCOPY N/A 02/19/2022   Procedure: DRUG INDUCED SLEEP ENDOSCOPY;  Surgeon: SJerrell Belfast MD;  [facility]  Service: ENT;  Laterality: N/A;   ESOPHAGOGASTRODUODENOSCOPY     EYE SURGERY     cataract surgery   KNEE ARTHROSCOPY  04/15/2012   Procedure: ARTHROSCOPY KNEE;  Surgeon:  [name], MD;  Location: [facility];  Service: Orthopedics;  Laterality: Right;   LAPAROSCOPIC CHOLECYSTECTOMY     LASIK     OOPHORECTOMY     PALATE TO GINGIVA GRAFT  2017   REVERSE SHOULDER ARTHROPLASTY Left 02/13/2021   Procedure: REVERSE SHOULDER ARTHROPLASTY;  Surgeon: Marchia Bond, MD;  Location: [facility];  Service: Orthopedics;  Laterality: Left;   RIGHT/LEFT HEART CATH AND CORONARY ANGIOGRAPHY N/A 08/02/2016   Procedure: Right/Left Heart Cath and Coronary Angiography;  Surgeon: Sherren Mocha, MD;  Location: [facility];  Service: Cardiovascular;  Laterality: N/A;   ROTATOR CUFF REPAIR Left    TEE WITHOUT CARDIOVERSION N/A 08/23/2016   Procedure: TRANSESOPHAGEAL ECHOCARDIOGRAM (TEE);  Surgeon: Rexene Alberts, MD;  Location: [facility];  Service: Open Heart Surgery;  Laterality: N/A;   TOTAL KNEE ARTHROPLASTY  04/15/2012   Procedure: TOTAL KNEE ARTHROPLASTY;  Surgeon: [name], MD;  Location: [facility];  Service: Orthopedics;  Laterality: Right;   TRIGGER FINGER RELEASE Right 04/20/2015   Procedure: RIGHT TRIGGER FINGER RELEASE (TENDON SHEALTH INCISION) ;  Surgeon: [name], MD;  Location: [facility];  Service: Orthopedics;  Laterality: Right;   Social History   Socioeconomic History   Marital status: Married    Spouse name: [name]   Number of children: 1   Years of education: Not on file   Highest education level: Not on file  Occupational History   Occupation: Retired, Curator: RETIRED  Tobacco Use   Smoking status: Never   Smokeless tobacco: Never  Vaping Use   Vaping Use: Never used  Substance and Sexual  Activity   Alcohol use: No   Drug use: No   Sexual activity: Yes    Birth control/protection: Surgical    Comment: hyst  Other Topics Concern   Not on file  Social History Narrative   She lives with husband (1978) and two dogs. Step-son [name] (1971-psychiatrist in [place]). No grandkids. 2 dogs-sheltie/collie mix and border collie mix      Highest level of education:  Master in education   She is retired Animal nutritionist x 32 years.      Hobbies: time with dogs   Right handed   One story home         Social Determinants of Health   Financial Resource Strain: Low Risk  (04/21/2020)   Overall Financial Resource Strain (CARDIA)    Difficulty of Paying Living Expenses: Not hard at all  Food Insecurity: No Food Insecurity (02/20/2022)   Hunger Vital Sign    Worried About Running Out of Food in the Last Year: Never true    Ran Out of Food in the Last Year: Never true  Transportation Needs: No Transportation Needs (02/20/2022)   PRAPARE - Hydrologist (Medical): No    Lack of Transportation (Non-Medical): No  Physical Activity: Insufficiently Active (04/21/2020)   Exercise Vital Sign    Days of Exercise per Week: 3 days    Minutes of Exercise per Session: 20 min  Stress: No Stress Concern Present (04/21/2020)   [place]    Feeling of Stress : Not at all  Social Connections: Moderately Isolated (04/21/2020)   Social Connection and Isolation Panel [NHANES]    Frequency of Communication with Friends and Family: More than three times a week    Frequency of Social Gatherings with Friends  and Family: Once a week    Attends Religious Services: Never    Active Member of Clubs or Organizations: No    Attends Archivist Meetings: Never    Marital Status: Married  Human resources officer Violence: Not At Risk (04/21/2020)   Humiliation, Afraid, Rape, and Kick questionnaire    Fear of  Current or Ex-Partner: No    Emotionally Abused: No    Physically Abused: No    Sexually Abused: No   Current Outpatient Medications on File Prior to Visit  Medication Sig Dispense Refill   ACCU-CHEK GUIDE test strip USE TO TEST BLOOD SUGARS DAILY. DX: E11.9 100 strip 12   Accu-Chek Softclix Lancets lancets USE AS INSTRUCTED 100 each 12   amLODipine (NORVASC) 10 MG tablet Take 1 tablet (10 mg total) by mouth daily. 90 tablet 3   atorvastatin (LIPITOR) 40 MG tablet TAKE 1 TABLET BY MOUTH DAILY AT 6 PM. 90 tablet 3   Biotin 5 MG TBDP SMARTSIG:1 By Mouth     blood glucose meter kit and supplies KIT Dispense based on patient and insurance preference. Use up to four times daily as directed. Dx: E11.9 1 each 0   calcium-vitamin D (OSCAL WITH D) 500-200 MG-UNIT per tablet Take 2 tablets by mouth daily.      cloNIDine (CATAPRES) 0.1 MG tablet Take by mouth.     clopidogrel (PLAVIX) 75 MG tablet TAKE 1 TABLET BY MOUTH EVERY DAY 90 tablet 3   D-5000 125 MCG (5000 UT) TABS SMARTSIG:1 Tablet(s) By Mouth     demeclocycline (DECLOMYCIN) 300 MG tablet Take 1 tablet (300 mg total) by mouth 2 (two) times daily. 180 tablet 3   fluticasone (FLONASE) 50 MCG/ACT nasal spray Place 2 sprays into both nostrils daily.     ipratropium (ATROVENT) 0.03 % nasal spray 2 SPRAYS IN EACH NOSTRIL AS NEEDED THREE TIMES AS NEEDED     irbesartan (AVAPRO) 300 MG tablet Take 1 tablet (300 mg total) by mouth daily. 90 tablet 3   loratadine (CLARITIN) 10 MG tablet Take 10 mg by mouth at bedtime.      Melatonin 5 MG TABS Take 5 mg by mouth at bedtime as needed (sleep).     metFORMIN (GLUCOPHAGE) 500 MG tablet Take 1 tablet (500 mg total) by mouth daily with breakfast. 180 tablet 1   metoCLOPramide (REGLAN) 10 MG tablet TAKE 1/2 TABLET BY MOUTH 4 TIMES A DAY 60 tablet 3   metroNIDAZOLE (METROCREAM) 0.75 % cream      Multiple Vitamins-Minerals (MULTIVITAMIN ADULTS) TABS SMARTSIG:1 By Mouth     omeprazole (PRILOSEC) 40 MG capsule  Take 1 capsule by mouth daily.     Probiotic Product (ALIGN) 4 MG CAPS Take 4 mg by mouth daily.      psyllium (METAMUCIL SMOOTH TEXTURE) 28 % packet Take 1 packet by mouth 2 (two) times daily.     sodium chloride 1 g tablet Take 3 g by mouth 2 (two) times daily with a meal.     venlafaxine XR (EFFEXOR-XR) 75 MG 24 hr capsule TAKE 1 CAPSULE BY MOUTH DAILY WITH BREAKFAST. 90 capsule 2   No current facility-administered medications on file prior to visit.   Allergies  Allergen Reactions   Augmentin [Amoxicillin-Pot Clavulanate] Rash   Erythromycin Nausea Only   Penicillins Itching and Rash    Has patient had a PCN reaction causing immediate rash, facial/tongue/throat swelling, SOB or lightheadedness with hypotension: No Has patient had a PCN reaction causing severe rash  involving mucus membranes or skin necrosis: No Has patient had a PCN reaction that required hospitalization: No Has patient had a PCN reaction occurring within the last 10 years: No  If all of the above answers are "NO", then may proceed with Cephalosporin use. Tolerated Ancef 02/13/21   Family History  Problem Relation Age of Onset   COPD Mother    Colon polyps Mother    Irritable bowel syndrome Mother    Anxiety disorder Mother    Heart disease Father    Alcohol abuse Father    Breast cancer Maternal Grandmother    Gallbladder disease Maternal Grandmother    Colon polyps Maternal Aunt    Diabetes Paternal Uncle        several uncles   Other Neg Hx        hyponatremia   Colon cancer Neg Hx    Esophageal cancer Neg Hx    Rectal cancer Neg Hx    Stomach cancer Neg Hx    PE: BP 118/82 (BP Location: Left Arm, Patient Position: Sitting, Cuff Size: Normal)   Pulse 70   Ht _0  (1.676 m)   Wt 200 lb (90.7 kg)   LMP  (LMP Unknown)   SpO2 95%   BMI 32.28 kg/m  Wt Readings from Last 3 Encounters:  04/22/22 200 lb (90.7 kg)  04/04/22 191 lb (86.6 kg)  04/03/22 195 lb 6.4 oz (88.6 kg)   Constitutional:  overweight, in NAD Eyes:  EOMI, no exophthalmos ENT: no neck masses, no cervical lymphadenopathy Cardiovascular: RRR, No MRG Respiratory: CTA B Musculoskeletal: no deformities Skin:no rashes Neurological: + tremor with outstretched hands  ASSESSMENT: 1. Hyponatremia -Patient with 10-year history of mild hyponatremia, likely in the setting of SIADH. -Lately, her sodium has fluctuated between 130 and 136 -Previous investigation not reveal a clear etiology: normal cosyntropin stimulation test, thyroid tests.  No history of cancer. -There is no indication of pseudohyponatremia (no increased triglycerides or proteins), or dilutional hyponatremia (normal BNP, liver and kidney tests).  Also, she does not have hypochloremia or low specific urine gravity associated with her hyponatremia, to suggest water overload, diarrhea/vomiting, longstanding diuretic use.  Also, no diarrhea or vomiting and does not drink excessive water.  Previous brain MRI (2021) showed a small occipital stroke and small white matter disease, but no other pathology. -latest sodium level was only very mildly low, at 134.  We will not repeat this today. -For now, we can continue the current regimen - I will see the patient back in 1 year  2.  Elevated alkaline phosphatase -Chronic -at last OV, Dr. Loanne Drilling checked a bone specific alkaline phosphatase (09/17/2021) elevated, at 59 -However, a bone scan performed on 08/23/2019 did not show pagetoid changes, only degenerative changes -We are continuing to follow her without intervention -Latest alkaline phosphatase 2 weeks ago was 118, decreased from 250 10 months ago -Of note, her vitamin D level was normal last month, at 12.2 -she can continue on the current supplements  Philemon Kingdom, MD PhD [facility]

## 2022-04-22 NOTE — Patient Instructions (Signed)
Please continue: - Demeclocycline 300 mg 2x a day - NaCl tablets 3 g 2x a day  Please return in 1 year.

## 2022-04-23 ENCOUNTER — Ambulatory Visit (HOSPITAL_BASED_OUTPATIENT_CLINIC_OR_DEPARTMENT_OTHER)
Admission: RE | Admit: 2022-04-23 | Discharge: 2022-04-23 | Disposition: A | Discharge status: Home / Self Care | Payer: Medicare PPO | Attending: Otolaryngology | Admitting: Otolaryngology

## 2022-04-23 ENCOUNTER — Ambulatory Visit (HOSPITAL_BASED_OUTPATIENT_CLINIC_OR_DEPARTMENT_OTHER): Payer: Medicare PPO | Admitting: Certified Registered"

## 2022-04-23 ENCOUNTER — Other Ambulatory Visit: Payer: Self-pay

## 2022-04-23 ENCOUNTER — Encounter (HOSPITAL_BASED_OUTPATIENT_CLINIC_OR_DEPARTMENT_OTHER)
Admission: RE | Disposition: A | Discharge status: Home / Self Care | Payer: Self-pay | Source: Home / Self Care | Attending: Otolaryngology

## 2022-04-23 ENCOUNTER — Ambulatory Visit (HOSPITAL_COMMUNITY): Payer: Medicare PPO

## 2022-04-23 ENCOUNTER — Encounter (HOSPITAL_BASED_OUTPATIENT_CLINIC_OR_DEPARTMENT_OTHER): Payer: Self-pay | Admitting: Otolaryngology

## 2022-04-23 DIAGNOSIS — I10 Essential (primary) hypertension: Secondary | ICD-10-CM | POA: Diagnosis not present

## 2022-04-23 DIAGNOSIS — Z952 Presence of prosthetic heart valve: Secondary | ICD-10-CM | POA: Diagnosis not present

## 2022-04-23 DIAGNOSIS — M1990 Unspecified osteoarthritis, unspecified site: Secondary | ICD-10-CM | POA: Diagnosis not present

## 2022-04-23 DIAGNOSIS — Z969 Presence of functional implant, unspecified: Secondary | ICD-10-CM | POA: Diagnosis not present

## 2022-04-23 DIAGNOSIS — F419 Anxiety disorder, unspecified: Secondary | ICD-10-CM | POA: Diagnosis not present

## 2022-04-23 DIAGNOSIS — Z7902 Long term (current) use of antithrombotics/antiplatelets: Secondary | ICD-10-CM | POA: Insufficient documentation

## 2022-04-23 DIAGNOSIS — Z7984 Long term (current) use of oral hypoglycemic drugs: Secondary | ICD-10-CM | POA: Diagnosis not present

## 2022-04-23 DIAGNOSIS — K219 Gastro-esophageal reflux disease without esophagitis: Secondary | ICD-10-CM | POA: Diagnosis not present

## 2022-04-23 DIAGNOSIS — G4733 Obstructive sleep apnea (adult) (pediatric): Secondary | ICD-10-CM

## 2022-04-23 DIAGNOSIS — J45909 Unspecified asthma, uncomplicated: Secondary | ICD-10-CM | POA: Diagnosis not present

## 2022-04-23 DIAGNOSIS — Z6831 Body mass index (BMI) 31.0-31.9, adult: Secondary | ICD-10-CM | POA: Diagnosis not present

## 2022-04-23 DIAGNOSIS — E1143 Type 2 diabetes mellitus with diabetic autonomic (poly)neuropathy: Secondary | ICD-10-CM | POA: Insufficient documentation

## 2022-04-23 DIAGNOSIS — Z789 Other specified health status: Secondary | ICD-10-CM | POA: Diagnosis not present

## 2022-04-23 DIAGNOSIS — Z462 Encounter for fitting and adjustment of other devices related to nervous system and special senses: Secondary | ICD-10-CM | POA: Diagnosis not present

## 2022-04-23 DIAGNOSIS — E785 Hyperlipidemia, unspecified: Secondary | ICD-10-CM | POA: Diagnosis not present

## 2022-04-23 DIAGNOSIS — Z9889 Other specified postprocedural states: Secondary | ICD-10-CM | POA: Diagnosis not present

## 2022-04-23 DIAGNOSIS — Z01818 Encounter for other preprocedural examination: Secondary | ICD-10-CM

## 2022-04-23 DIAGNOSIS — E119 Type 2 diabetes mellitus without complications: Secondary | ICD-10-CM | POA: Diagnosis not present

## 2022-04-23 DIAGNOSIS — R519 Headache, unspecified: Secondary | ICD-10-CM | POA: Diagnosis not present

## 2022-04-23 DIAGNOSIS — K3184 Gastroparesis: Secondary | ICD-10-CM | POA: Insufficient documentation

## 2022-04-23 DIAGNOSIS — J9811 Atelectasis: Secondary | ICD-10-CM | POA: Diagnosis not present

## 2022-04-23 HISTORY — PX: IMPLANTATION OF HYPOGLOSSAL NERVE STIMULATOR: SHX6827

## 2022-04-23 LAB — GLUCOSE, CAPILLARY: Glucose-Capillary: 224 mg/dL — ABNORMAL HIGH (ref 70–99)

## 2022-04-23 SURGERY — INSERTION, HYPOGLOSSAL NERVE STIMULATOR
Anesthesia: General | Site: Chest | Laterality: Right

## 2022-04-23 MED ORDER — ONDANSETRON HCL 4 MG/2ML IJ SOLN
INTRAMUSCULAR | Status: AC
  Filled 2022-04-23: qty 2

## 2022-04-23 MED ORDER — PHENYLEPHRINE HCL-NACL 20-0.9 MG/250ML-% IV SOLN
INTRAVENOUS | Status: DC | PRN
  Administered 2022-04-23: 50 ug/min via INTRAVENOUS

## 2022-04-23 MED ORDER — FENTANYL CITRATE (PF) 100 MCG/2ML IJ SOLN
INTRAMUSCULAR | Status: AC
  Filled 2022-04-23: qty 2

## 2022-04-23 MED ORDER — ACETAMINOPHEN 500 MG PO TABS
1000.0000 mg | ORAL_TABLET | Freq: Once | ORAL | Status: AC
  Administered 2022-04-23: 1000 mg via ORAL

## 2022-04-23 MED ORDER — ONDANSETRON HCL 4 MG/2ML IJ SOLN
INTRAMUSCULAR | Status: DC | PRN
  Administered 2022-04-23: 4 mg via INTRAVENOUS

## 2022-04-23 MED ORDER — PHENYLEPHRINE 80 MCG/ML (10ML) SYRINGE FOR IV PUSH (FOR BLOOD PRESSURE SUPPORT)
PREFILLED_SYRINGE | INTRAVENOUS | Status: AC
  Filled 2022-04-23: qty 10

## 2022-04-23 MED ORDER — FENTANYL CITRATE (PF) 100 MCG/2ML IJ SOLN
25.0000 ug | INTRAMUSCULAR | Status: DC | PRN
  Administered 2022-04-23: 50 ug via INTRAVENOUS

## 2022-04-23 MED ORDER — SUCCINYLCHOLINE CHLORIDE 200 MG/10ML IV SOSY
PREFILLED_SYRINGE | INTRAVENOUS | Status: DC | PRN
  Administered 2022-04-23: 140 mg via INTRAVENOUS

## 2022-04-23 MED ORDER — PROPOFOL 500 MG/50ML IV EMUL
INTRAVENOUS | Status: DC | PRN
  Administered 2022-04-23: 150 ug/kg/min via INTRAVENOUS
  Administered 2022-04-23: 100 ug/kg/min via INTRAVENOUS

## 2022-04-23 MED ORDER — FENTANYL CITRATE (PF) 100 MCG/2ML IJ SOLN
INTRAMUSCULAR | Status: DC | PRN
  Administered 2022-04-23 (×5): 50 ug via INTRAVENOUS

## 2022-04-23 MED ORDER — HYDROCODONE-ACETAMINOPHEN 5-325 MG PO TABS: ORAL_TABLET | ORAL | 0 refills | Status: DC

## 2022-04-23 MED ORDER — DEXAMETHASONE SODIUM PHOSPHATE 4 MG/ML IJ SOLN
INTRAMUSCULAR | Status: DC | PRN
  Administered 2022-04-23: 10 mg via INTRAVENOUS

## 2022-04-23 MED ORDER — LIDOCAINE 2% (20 MG/ML) 5 ML SYRINGE
INTRAMUSCULAR | Status: AC
  Filled 2022-04-23: qty 5

## 2022-04-23 MED ORDER — EPHEDRINE SULFATE (PRESSORS) 50 MG/ML IJ SOLN
INTRAMUSCULAR | Status: DC | PRN
  Administered 2022-04-23 (×2): 10 mg via INTRAVENOUS
  Administered 2022-04-23: 15 mg via INTRAVENOUS

## 2022-04-23 MED ORDER — 0.9 % SODIUM CHLORIDE (POUR BTL) OPTIME
TOPICAL | Status: DC | PRN
  Administered 2022-04-23: 120 mL

## 2022-04-23 MED ORDER — DEXAMETHASONE SODIUM PHOSPHATE 10 MG/ML IJ SOLN
INTRAMUSCULAR | Status: AC
  Filled 2022-04-23: qty 1

## 2022-04-23 MED ORDER — LACTATED RINGERS IV SOLN: INTRAVENOUS | Status: DC

## 2022-04-23 MED ORDER — PROPOFOL 500 MG/50ML IV EMUL
INTRAVENOUS | Status: AC
  Filled 2022-04-23: qty 100

## 2022-04-23 MED ORDER — LIDOCAINE HCL (CARDIAC) PF 100 MG/5ML IV SOSY
PREFILLED_SYRINGE | INTRAVENOUS | Status: DC | PRN
  Administered 2022-04-23: 100 mg via INTRAVENOUS

## 2022-04-23 MED ORDER — VANCOMYCIN HCL IN DEXTROSE 1-5 GM/200ML-% IV SOLN
1000.0000 mg | INTRAVENOUS | Status: AC
  Administered 2022-04-23: 1000 mg via INTRAVENOUS

## 2022-04-23 MED ORDER — VANCOMYCIN HCL IN DEXTROSE 1-5 GM/200ML-% IV SOLN
INTRAVENOUS | Status: AC
  Filled 2022-04-23: qty 200

## 2022-04-23 MED ORDER — LIDOCAINE-EPINEPHRINE 1 %-1:100000 IJ SOLN
INTRAMUSCULAR | Status: AC
  Filled 2022-04-23: qty 1

## 2022-04-23 MED ORDER — ACETAMINOPHEN 500 MG PO TABS
ORAL_TABLET | ORAL | Status: AC
  Filled 2022-04-23: qty 2

## 2022-04-23 MED ORDER — EPHEDRINE 5 MG/ML INJ
INTRAVENOUS | Status: AC
  Filled 2022-04-23: qty 5

## 2022-04-23 MED ORDER — GLYCOPYRROLATE PF 0.2 MG/ML IJ SOSY
PREFILLED_SYRINGE | INTRAMUSCULAR | Status: AC
  Filled 2022-04-23: qty 1

## 2022-04-23 MED ORDER — PROPOFOL 10 MG/ML IV BOLUS
INTRAVENOUS | Status: DC | PRN
  Administered 2022-04-23: 50 mg via INTRAVENOUS
  Administered 2022-04-23: 100 mg via INTRAVENOUS
  Administered 2022-04-23: 50 mg via INTRAVENOUS

## 2022-04-23 MED ORDER — PHENYLEPHRINE HCL (PRESSORS) 10 MG/ML IV SOLN
INTRAVENOUS | Status: DC | PRN
  Administered 2022-04-23 (×2): 160 ug via INTRAVENOUS

## 2022-04-23 MED ORDER — GLYCOPYRROLATE 0.2 MG/ML IJ SOLN
INTRAMUSCULAR | Status: DC | PRN
  Administered 2022-04-23: .2 mg via INTRAVENOUS

## 2022-04-23 MED ORDER — LIDOCAINE-EPINEPHRINE 1 %-1:100000 IJ SOLN
INTRAMUSCULAR | Status: DC | PRN
  Administered 2022-04-23: 5 mL

## 2022-04-23 MED ORDER — SUCCINYLCHOLINE CHLORIDE 200 MG/10ML IV SOSY
PREFILLED_SYRINGE | INTRAVENOUS | Status: AC
  Filled 2022-04-23: qty 10

## 2022-04-23 SURGICAL SUPPLY — 72 items
ACC NRSTM 4 TRQ WRNCH STRL (MISCELLANEOUS)
ADH SKN CLS APL DERMABOND .7 (GAUZE/BANDAGES/DRESSINGS) ×2
ATTRACTOMAT 16X20 MAGNETIC DRP (DRAPES) IMPLANT
BLADE CLIPPER SURG (BLADE) IMPLANT
BLADE SURG 15 STRL LF DISP TIS (BLADE) ×1 IMPLANT
BLADE SURG 15 STRL SS (BLADE) ×1
CANISTER SUCT 1200ML W/VALVE (MISCELLANEOUS) ×1 IMPLANT
CORD BIPOLAR FORCEPS 12FT (ELECTRODE) ×1 IMPLANT
COVER PROBE W GEL 5X96 (DRAPES) ×1 IMPLANT
DERMABOND ADVANCED .7 DNX12 (GAUZE/BANDAGES/DRESSINGS) ×1 IMPLANT
DRAPE C-ARM 35X43 STRL (DRAPES) ×1 IMPLANT
DRAPE INCISE IOBAN 66X45 STRL (DRAPES) ×1 IMPLANT
DRAPE MICROSCOPE WILD 40.5X102 (DRAPES) ×1 IMPLANT
DRAPE UTILITY XL STRL (DRAPES) ×1 IMPLANT
DRSG TEGADERM 2-3/8X2-3/4 SM (GAUZE/BANDAGES/DRESSINGS) ×1 IMPLANT
DRSG TEGADERM 4X4.75 (GAUZE/BANDAGES/DRESSINGS) ×1 IMPLANT
ELECT COATED BLADE 2.86 ST (ELECTRODE) ×1 IMPLANT
ELECT EMG 18 NIMS (NEUROSURGERY SUPPLIES) ×1
ELECT REM PT RETURN 9FT ADLT (ELECTROSURGICAL) ×1
ELECTRODE EMG 18 NIMS (NEUROSURGERY SUPPLIES) ×1 IMPLANT
ELECTRODE REM PT RTRN 9FT ADLT (ELECTROSURGICAL) ×1 IMPLANT
FORCEPS BIPOLAR SPETZLER 8 1.0 (NEUROSURGERY SUPPLIES) ×1 IMPLANT
GAUZE 4X4 16PLY ~~LOC~~+RFID DBL (SPONGE) ×1 IMPLANT
GAUZE SPONGE 4X4 12PLY STRL (GAUZE/BANDAGES/DRESSINGS) ×1 IMPLANT
GENERATOR PULSE INSPIRE (Generator) ×1 IMPLANT
GENERATOR PULSE INSPIRE IV (Generator) ×1 IMPLANT
GLOVE BIO SURGEON STRL SZ 6.5 (GLOVE) IMPLANT
GLOVE BIOGEL M 7.0 STRL (GLOVE) ×1 IMPLANT
GLOVE BIOGEL PI IND STRL 7.0 (GLOVE) IMPLANT
GLOVE SURG SS PI 6.5 STRL IVOR (GLOVE) IMPLANT
GLOVE SURG SS PI 7.0 STRL IVOR (GLOVE) IMPLANT
GOWN STRL REUS W/ TWL LRG LVL3 (GOWN DISPOSABLE) ×3 IMPLANT
GOWN STRL REUS W/TWL LRG LVL3 (GOWN DISPOSABLE) ×3
IV CATH 18G SAFETY (IV SOLUTION) ×1 IMPLANT
KIT NEURO ACCESSORY W/WRENCH (MISCELLANEOUS) IMPLANT
LEAD SENSING RESP INSPIRE (Lead) ×1 IMPLANT
LEAD SENSING RESP INSPIRE IV (Lead) ×1 IMPLANT
LEAD SLEEP STIM INSPIRE IV/V (Lead) ×1 IMPLANT
LEAD SLEEP STIMULATION INSPIRE (Lead) ×1 IMPLANT
LOOP VESSEL MAXI BLUE (MISCELLANEOUS) ×1 IMPLANT
LOOP VESSEL MINI RED (MISCELLANEOUS) ×1 IMPLANT
MARKER SKIN DUAL TIP RULER LAB (MISCELLANEOUS) ×1 IMPLANT
NDL HYPO 25X1 1.5 SAFETY (NEEDLE) ×1 IMPLANT
NEEDLE HYPO 25X1 1.5 SAFETY (NEEDLE) ×1 IMPLANT
NS IRRIG 1000ML POUR BTL (IV SOLUTION) ×1 IMPLANT
PACK BASIN DAY SURGERY FS (CUSTOM PROCEDURE TRAY) ×1 IMPLANT
PACK ENT DAY SURGERY (CUSTOM PROCEDURE TRAY) ×1 IMPLANT
PASSER CATH 36 CODMAN DISP (NEUROSURGERY SUPPLIES) IMPLANT
PASSER CATH 38CM DISP (INSTRUMENTS) IMPLANT
PENCIL SMOKE EVACUATOR (MISCELLANEOUS) ×1 IMPLANT
PROBE NERVE STIMULATOR (NEUROSURGERY SUPPLIES) ×1 IMPLANT
REMOTE CONTROL SLEEP INSPIRE (MISCELLANEOUS) ×1 IMPLANT
SET WALTER ACTIVATION W/DRAPE (SET/KITS/TRAYS/PACK) ×1 IMPLANT
SLEEVE SCD COMPRESS KNEE MED (STOCKING) ×1 IMPLANT
SPIKE FLUID TRANSFER (MISCELLANEOUS) ×1 IMPLANT
SPONGE INTESTINAL PEANUT (DISPOSABLE) ×1 IMPLANT
STAPLER VISISTAT 35W (STAPLE) ×1 IMPLANT
SUT SILK 2 0 SH (SUTURE) ×1 IMPLANT
SUT SILK 3 0 RB1 (SUTURE) IMPLANT
SUT SILK 3 0 REEL (SUTURE) ×1 IMPLANT
SUT SILK 3 0 SH 30 (SUTURE) IMPLANT
SUT SILK 3-0 (SUTURE) ×1
SUT SILK 3-0 RB1 30XBRD (SUTURE) ×1
SUT VIC AB 3-0 SH 27 (SUTURE)
SUT VIC AB 3-0 SH 27X BRD (SUTURE) ×1 IMPLANT
SUT VIC AB 4-0 PS2 27 (SUTURE) ×2 IMPLANT
SUT VIC AB 5-0 P-3 18X BRD (SUTURE) IMPLANT
SUT VIC AB 5-0 P3 18 (SUTURE) ×1
SUTURE SILK 3-0 RB1 30XBRD (SUTURE) ×1 IMPLANT
SYR 10ML LL (SYRINGE) ×1 IMPLANT
SYR BULB EAR ULCER 3OZ GRN STR (SYRINGE) ×1 IMPLANT
TOWEL GREEN STERILE FF (TOWEL DISPOSABLE) ×2 IMPLANT

## 2022-04-23 NOTE — Discharge Instructions (Signed)
May have Tylenol today, 04/23/2022, after 5:00 PM.    Post Anesthesia Home Care Instructions  Activity: Get plenty of rest for the remainder of the day. A responsible individual must stay with you for 24 hours following the procedure.  For the next 24 hours, DO NOT: -Drive a car -Paediatric nurse -Drink alcoholic beverages -Take any medication unless instructed by your physician -Make any legal decisions or sign important papers.  Meals: Start with liquid foods such as gelatin or soup. Progress to regular foods as tolerated. Avoid greasy, spicy, heavy foods. If nausea and/or vomiting occur, drink only clear liquids until the nausea and/or vomiting subsides. Call your physician if vomiting continues.  Special Instructions/Symptoms: Your throat may feel dry or sore from the anesthesia or the breathing tube placed in your throat during surgery. If this causes discomfort, gargle with warm salt water. The discomfort should disappear within 24 hours.  If you had a scopolamine patch placed behind your ear for the management of post- operative nausea and/or vomiting:  1. The medication in the patch is effective for 72 hours, after which it should be removed.  Wrap patch in a tissue and discard in the trash. Wash hands thoroughly with soap and water. 2. You may remove the patch earlier than 72 hours if you experience unpleasant side effects which may include dry mouth, dizziness or visual disturbances. 3. Avoid touching the patch. Wash your hands with soap and water after contact with the patch.

## 2022-04-23 NOTE — H&P (Signed)
[name] is an 71 y.o. female.   Chief Complaint: Obstructive Sleep Apnea HPI: Hx of OSA, unable to tolerate CPAP  Past Medical History:  Diagnosis Date   Allergy    Anxiety    Arthritis    back & knee   Asthma     mild per pt shows up with resp illness   Colon polyps    Constipation    Diabetes mellitus without complication (HCC)    Gallstones    Gastric polyps    Gastroparesis    GERD (gastroesophageal reflux disease)    Headache    sinus headaches and migraines at times   Heart murmur    History of migraine headaches    HTN (hypertension)    Hyperlipidemia    IBS (irritable bowel syndrome)    Joint pain    Lumbar disc disease    PONV (postoperative nausea and vomiting)    S/P aortic valve replacement with bioprosthetic valve 08/23/2016   25 mm Edwards Intuity rapid-deployment bovine pericardial tissue valve via partial upper mini sternotomy   Sleep apnea    does not use CPAP   TIA (transient ischemic attack)    TIA (transient ischemic attack)    hx of per pt    Past Surgical History:  Procedure Laterality Date   ABDOMINAL HYSTERECTOMY     AORTIC VALVE REPLACEMENT N/A 08/23/2016   Procedure: AORTIC VALVE REPLACEMENT (AVR) - using partial Upper Sternotomy- 70m Edwards Intuity Aortic Valve used;  Surgeon: CRexene Alberts MD;  [facility]  Service: Open Heart Surgery;  Laterality: N/A;   BLADDER SUSPENSION  2007   BREAST ENHANCEMENT SURGERY  1980   CARPAL TUNNEL RELEASE Bilateral    COLONOSCOPY     DORSAL COMPARTMENT RELEASE Right 09/15/2014   Procedure: RIGHT WRIST DEQUERVAINS RELEASE ;  Surgeon: DKathryne Hitch MD;  [place] [facility]  Service: Orthopedics;  Laterality: Right;   DRUG INDUCED ENDOSCOPY N/A 02/19/2022   Procedure: DRUG INDUCED SLEEP ENDOSCOPY;  Surgeon: SJerrell Belfast MD;  [place] [facility]  Service: ENT;  Laterality: N/A;   ESOPHAGOGASTRODUODENOSCOPY     EYE SURGERY     cataract surgery   KNEE  ARTHROSCOPY  04/15/2012   Procedure: ARTHROSCOPY KNEE;  Surgeon: DNinetta Lights MD;  [facility]  Service: Orthopedics;  Laterality: Right;   LAPAROSCOPIC CHOLECYSTECTOMY     LASIK     OOPHORECTOMY     PALATE TO GINGIVA GRAFT  2017   REVERSE SHOULDER ARTHROPLASTY Left 02/13/2021   Procedure: REVERSE SHOULDER ARTHROPLASTY;  Surgeon: LMarchia Bond MD;  [facility];  Service: Orthopedics;  Laterality: Left;   RIGHT/LEFT HEART CATH AND CORONARY ANGIOGRAPHY N/A 08/02/2016   Procedure: Right/Left Heart Cath and Coronary Angiography;  Surgeon: MSherren Mocha MD;  [facility];  Service: Cardiovascular;  Laterality: N/A;   ROTATOR CUFF REPAIR Left    TEE WITHOUT CARDIOVERSION N/A 08/23/2016   Procedure: TRANSESOPHAGEAL ECHOCARDIOGRAM (TEE);  Surgeon: CRexene Alberts MD;  [place] [facility]  Service: Open Heart Surgery;  Laterality: N/A;   TOTAL KNEE ARTHROPLASTY  04/15/2012   Procedure: TOTAL KNEE ARTHROPLASTY;  Surgeon: DNinetta Lights MD;  [facility] [facility]  Service: Orthopedics;  Laterality: Right;   TRIGGER FINGER RELEASE Right 04/20/2015   Procedure: RIGHT TRIGGER FINGER RELEASE (TENDON SHEALTH INCISION) ;  Surgeon: DNinetta Lights MD;  [facility]  Service: Orthopedics;  Laterality: Right;    Family  History  Problem Relation Age of Onset   COPD Mother    Colon polyps Mother    Irritable bowel syndrome Mother    Anxiety disorder Mother    Heart disease Father    Alcohol abuse Father    Breast cancer Maternal Grandmother    Gallbladder disease Maternal Grandmother    Colon polyps Maternal Aunt    Diabetes Paternal Uncle        several uncles   Other Neg Hx        hyponatremia   Colon cancer Neg Hx    Esophageal cancer Neg Hx    Rectal cancer Neg Hx    Stomach cancer Neg Hx    Social History:  reports that she has never smoked. She has never used smokeless tobacco. She reports that she does not drink alcohol and does not use  drugs.  Allergies:  Allergies  Allergen Reactions   Augmentin [Amoxicillin-Pot Clavulanate] Rash   Erythromycin Nausea Only   Penicillins Itching and Rash    Has patient had a PCN reaction causing immediate rash, facial/tongue/throat swelling, SOB or lightheadedness with hypotension: No Has patient had a PCN reaction causing severe rash involving mucus membranes or skin necrosis: No Has patient had a PCN reaction that required hospitalization: No Has patient had a PCN reaction occurring within the last 10 years: No  If all of the above answers are "NO", then may proceed with Cephalosporin use. Tolerated Ancef 02/13/21    Medications Prior to Admission  Medication Sig Dispense Refill   amLODipine (NORVASC) 10 MG tablet Take 1 tablet (10 mg total) by mouth daily. 90 tablet 3   atorvastatin (LIPITOR) 40 MG tablet TAKE 1 TABLET BY MOUTH DAILY AT 6 PM. 90 tablet 3   Biotin 5 MG TBDP SMARTSIG:1 By Mouth     calcium-vitamin D (OSCAL WITH D) 500-200 MG-UNIT per tablet Take 2 tablets by mouth daily.      cloNIDine (CATAPRES) 0.1 MG tablet Take by mouth.     clopidogrel (PLAVIX) 75 MG tablet TAKE 1 TABLET BY MOUTH EVERY DAY 90 tablet 3   D-5000 125 MCG (5000 UT) TABS SMARTSIG:1 Tablet(s) By Mouth     demeclocycline (DECLOMYCIN) 300 MG tablet Take 1 tablet (300 mg total) by mouth 2 (two) times daily. 180 tablet 3   fluticasone (FLONASE) 50 MCG/ACT nasal spray Place 2 sprays into both nostrils daily.     ipratropium (ATROVENT) 0.03 % nasal spray 2 SPRAYS IN EACH NOSTRIL AS NEEDED THREE TIMES AS NEEDED     irbesartan (AVAPRO) 300 MG tablet Take 1 tablet (300 mg total) by mouth daily. 90 tablet 3   loratadine (CLARITIN) 10 MG tablet Take 10 mg by mouth at bedtime.      Melatonin 5 MG TABS Take 5 mg by mouth at bedtime as needed (sleep).     metFORMIN (GLUCOPHAGE) 500 MG tablet Take 1 tablet (500 mg total) by mouth daily with breakfast. 180 tablet 1   metoCLOPramide (REGLAN) 10 MG tablet TAKE 1/2  TABLET BY MOUTH 4 TIMES A DAY 60 tablet 3   metroNIDAZOLE (METROCREAM) 0.75 % cream      Multiple Vitamins-Minerals (MULTIVITAMIN ADULTS) TABS SMARTSIG:1 By Mouth     omeprazole (PRILOSEC) 40 MG capsule Take 1 capsule by mouth daily.     Probiotic Product (ALIGN) 4 MG CAPS Take 4 mg by mouth daily.      psyllium (METAMUCIL SMOOTH TEXTURE) 28 % packet Take 1 packet by mouth 2 (two)  times daily.     sodium chloride 1 g tablet Take 3 g by mouth 2 (two) times daily with a meal.     venlafaxine XR (EFFEXOR-XR) 75 MG 24 hr capsule TAKE 1 CAPSULE BY MOUTH DAILY WITH BREAKFAST. 90 capsule 2   ACCU-CHEK GUIDE test strip USE TO TEST BLOOD SUGARS DAILY. DX: E11.9 100 strip 12   Accu-Chek Softclix Lancets lancets USE AS INSTRUCTED 100 each 12   blood glucose meter kit and supplies KIT Dispense based on patient and insurance preference. Use up to four times daily as directed. Dx: E11.9 1 each 0    No results found. However, due to the size of the patient record, not all encounters were searched. Please check Results Review for a complete set of results. No results found.  Review of Systems  Respiratory:  Positive for apnea.     Blood pressure 131/66, pulse 80, temperature 98.3 F (36.8 C), temperature source Oral, resp. rate 16, height 5' 6" (1.676 m), weight 88.8 kg, SpO2 100 %. Physical Exam Constitutional:      Appearance: She is normal weight.  Cardiovascular:     Rate and Rhythm: Normal rate.     Pulses: Normal pulses.  Pulmonary:     Effort: Pulmonary effort is normal.  Musculoskeletal:     Cervical back: Normal range of motion.  Neurological:     Mental Status: She is alert.      Assessment/Plan Adm for hypoglossal nerve stimulator - Inspire Implant as OP under GA  Jerrell Belfast, MD 04/23/2022, 12:00 PM

## 2022-04-23 NOTE — Anesthesia Procedure Notes (Signed)
Procedure Name: Intubation Date/Time: 04/23/2022 12:26 PM  Performed by: Ezequiel Kayser, CRNAPre-anesthesia Checklist: Patient identified, Emergency Drugs available, Suction available and Patient being monitored Patient Re-evaluated:Patient Re-evaluated prior to induction Oxygen Delivery Method: Circle System Utilized Preoxygenation: Pre-oxygenation with 100% oxygen Induction Type: IV induction Ventilation: Mask ventilation without difficulty Laryngoscope Size: Mac and 3 Grade View: Grade I Tube type: Oral Tube size: 7.0 mm Number of attempts: 1 Airway Equipment and Method: Stylet and Oral airway Placement Confirmation: ETT inserted through vocal cords under direct vision, positive ETCO2 and breath sounds checked- equal and bilateral Secured at: 23 cm Tube secured with: Tape Dental Injury: Teeth and Oropharynx as per pre-operative assessment

## 2022-04-23 NOTE — Op Note (Signed)
Operative Note: INSPIRE IMPLANT  Patient: [name]  Medical record number: [id number]  Date:04/23/2022  Pre-operative Indications: Moderate/severe obstructive sleep apnea with positive airway pressure intolerance  Postoperative Indications: Same  Surgical Procedure:  1.  12th cranial nerve (hypoglossal) stimulation implant    2.  Placement of chest wall respiratory sensor    3.  Electronic analysis of implanted neurostimulator with pulse generator system  Anesthesia: GET  Surgeon: Delsa Bern, M.D.  Assist: Scrub tech  BMI: 31.6 kg/m  Signs and symptoms of the patient's obstructive sleep apnea include poor sleep quality, daytime fatigue and hypersomnolence and inability to tolerate CPAP.  Indications for hypoglossal nerve stimulator are significant obstructive sleep apnea with inability to tolerate medical treatment.  Complications: None  EBL: Less than 50 cc   Brief History: The patient is a 71 y.o. female with a history of moderate/severe obstructive sleep apnea.  The patient has undergone work-up including sleep study and trial of CPAP which they did not tolerate.  Patient is unable to use long-term CPAP for control of obstructive sleep apnea.  Patient underwent DISE which showed anterior to posterior airway obstruction, considered a good candidate for Inspire Implant. Given the patient's history and findings, I recommended Inspire Implant under general anesthesia, risks and benefits were discussed in detail with the patient and family. They understand and agree with our plan for surgery which is scheduled at [facility] Day surgery on an elective basis.  Surgical Procedure: The patient is brought to the operating room on 04/23/2022 and placed in supine position on the operating table. General endotracheal anesthesia was established without difficulty. When the patient was adequately anesthetized, surgical timeout was performed and correct identification of the patient  and the surgical procedure. The patient was injected with 5 cc of 1% lidocaine 1:100,000 dilution epinephrine in subcutaneous fashion in the proposed skin incisions.  The patient was positioned and prepped and draped in sterile fashion.  The NIMS monitoring system was positioned and electrodes were placed in the anterior floor of mouth and tongue to assess the genioglossus and styloglossus muscle groups.  Nerve monitoring was used throughout the surgical procedure.  The procedure was begun by creating a modified right submandibular incision in the upper anterior lateral neck ~2 cm below the mandible and in a natural skin crease.  The incision was carried through the skin and underlying subcutaneous tissue to the level of the platysma.  Platysma muscle was divided and subplatysmal flaps were elevated superiorly and inferiorly.  The submandibular space was entered and the submandibular gland was identified and retracted posteriorly.  The digastric tendon was identified and dissection was carried out anterior and posterior along the tendon and muscle bellies.  The mylohyoid muscle was identified and retracted anteriorly and the hypoglossal nerve was carefully dissected across the anterior floor of the submandibular space.  Lateral branches to the retrusor muscles were identified and tested intraoperatively using the NIMS stimulator.  Stimulation electrode cuff for the hypoglossal nerve stimulator was placed distal to these branches on the medial hypoglossal nerve branch innervating the genioglossus muscle.  Diagnostic evaluation confirmed activation of the genioglossus muscle, resulting in genioglossal activation and tongue protrusion which was visually confirmed in the operating room.  The stimulation electrode was  sutured to the digastric tendon with interrupted 4-0 silk sutures.  A second second incision was created in the anterior upper right chest wall overlying the second rib interspace.  Incision was  carried through the skin and underlying subcutaneous tissue to  the level of the pectoralis major muscle.  The IPG pocket was created deep to the subcutaneous layer and superficial to the pectoralis muscle.  Placement of the stimulation lead was then undertaken by gentle dissection through the pectoralis major muscle parallel to the muscle fibers overlying the second rib interspace.  The subpectoral fat plane was then divided bluntly and the medial border of the external intercostal muscle was identified.  1 cm posterior to the medial border, dissection was carried through the external intercostal and a plane was developed in the second rib interspace.  The sensor lead was then tunneled into the second rib interspace and sutured with 3-0 silk suture to secure it in proper orientation with the sensing probe facing the pleural space.  The pectoralis major muscle dissection was then brought back into its normal anatomic position and the sense lead was brought out into the subcutaneous pocket.  The lead for the stimulating electrode was then tunneled in a subplatysmal fashion from the submandibular incision to the anterior chest wall incision.  The stimulating electrode and respiration sensing lead were connected to the implantable pulse generator.  Diagnostic evaluation was run confirming respiratory signal and good tongue protrusion on stimulation.  The implantable pulse generator was then placed in the subclavicular pocket and sutured to the pectoralis fascia with 2-0 silk sutures sutures.  The incisions were thoroughly irrigated with bacitracin saline irrigation.  The incisions were then closed in multiple layers with 4-0 and 5-0 Vicryl interrupted sutures in the deep and superficial subcutaneous levels at each incision site.  Dermabond surgical glue was used for skin closure.  The patient's incisions were dressed with rolled gauze and Hypafix tape for pressure dressing.    An orogastric tube was passed and  stomach contents were aspirated. Patient was awakened from anesthetic and transferred from the operating room to the recovery room in stable condition. There were no complications and blood loss was minimal.  X-rays were obtained in the recovery room to assess the proper location of the pulse generator, sensor lead and stimulation lead.   Delsa Bern, M.D. [facility] 04/23/2022

## 2022-04-23 NOTE — Anesthesia Preprocedure Evaluation (Addendum)
Anesthesia Evaluation  Patient identified by MRN, date of birth, ID band Patient awake    Reviewed: Allergy & Precautions, H&P , NPO status , Patient's Chart, lab work & pertinent test results  History of Anesthesia Complications (+) PONV and history of anesthetic complications  Airway Mallampati: II  TM Distance: >3 FB Neck ROM: Full    Dental no notable dental hx. (+) Teeth Intact, Dental Advisory Given   Pulmonary asthma , sleep apnea ,    Pulmonary exam normal breath sounds clear to auscultation       Cardiovascular hypertension, Pt. on medications  Rhythm:Regular Rate:Normal  S/p AVR   Neuro/Psych  Headaches, Anxiety    GI/Hepatic Neg liver ROS, GERD  Medicated,  Endo/Other  diabetes, Type 2, Oral Hypoglycemic Agents  Renal/GU negative Renal ROS  negative genitourinary   Musculoskeletal  (+) Arthritis , Osteoarthritis,    Abdominal   Peds  Hematology negative hematology ROS (+)   Anesthesia Other Findings   Reproductive/Obstetrics negative OB ROS                            Anesthesia Physical Anesthesia Plan  ASA: 3  Anesthesia Plan: General   Post-op Pain Management: Tylenol PO (pre-op)*   Induction: Intravenous  PONV Risk Score and Plan: 4 or greater and Ondansetron, Dexamethasone, Propofol infusion and TIVA  Airway Management Planned: Oral ETT  Additional Equipment:   Intra-op Plan:   Post-operative Plan: Extubation in OR  Informed Consent: I have reviewed the patients History and Physical, chart, labs and discussed the procedure including the risks, benefits and alternatives for the proposed anesthesia with the patient or authorized representative who has indicated his/her understanding and acceptance.     Dental advisory given  Plan Discussed with: CRNA  Anesthesia Plan Comments:         Anesthesia Quick Evaluation

## 2022-04-23 NOTE — Transfer of Care (Signed)
Immediate Anesthesia Transfer of Care Note  Patient: [name] Like  Procedure(s) Performed: IMPLANTATION OF HYPOGLOSSAL NERVE STIMULATOR (Right: Chest)  Patient Location: PACU  Anesthesia Type:General  Level of Consciousness: drowsy  Airway & Oxygen Therapy: Patient Spontanous Breathing and Patient connected to face mask oxygen  Post-op Assessment: Report given to RN and Post -op Vital signs reviewed and stable  Post vital signs: Reviewed and stable  Last Vitals:  Vitals Value Taken Time  BP 122/60 04/23/22 1532  Temp    Pulse 84 04/23/22 1534  Resp 13 04/23/22 1534  SpO2 98 % 04/23/22 1534  Vitals shown include unvalidated device data.  Last Pain:  Vitals:   04/23/22 1048  TempSrc: Oral  PainSc: 0-No pain      Patients Stated Pain Goal: 3 (03/04/67 1661)  Complications: No notable events documented.

## 2022-04-24 ENCOUNTER — Encounter (HOSPITAL_BASED_OUTPATIENT_CLINIC_OR_DEPARTMENT_OTHER): Payer: Self-pay | Admitting: Otolaryngology

## 2022-04-24 NOTE — Anesthesia Postprocedure Evaluation (Signed)
Anesthesia Post Note  Patient: [name]  Procedure(s) Performed: IMPLANTATION OF HYPOGLOSSAL NERVE STIMULATOR (Right: Chest)     Patient location during evaluation: PACU Anesthesia Type: General Level of consciousness: awake and alert Pain management: pain level controlled Vital Signs Assessment: post-procedure vital signs reviewed and stable Respiratory status: spontaneous breathing, nonlabored ventilation, respiratory function stable and patient connected to nasal cannula oxygen Cardiovascular status: blood pressure returned to baseline and stable Postop Assessment: no apparent nausea or vomiting Anesthetic complications: no   No notable events documented.  Last Vitals:  Vitals:   04/23/22 1647 04/23/22 1702  BP: (!) 141/75 (!) 160/73  Pulse: 87 86  Resp: 10 20  Temp:  36.9 C  SpO2: 94% 96%    Last Pain:  Vitals:   04/23/22 1702  TempSrc:   PainSc: 3                  Adiba Fargnoli S

## 2022-04-26 ENCOUNTER — Ambulatory Visit: Payer: Medicare PPO | Admitting: Internal Medicine

## 2022-05-02 DIAGNOSIS — L910 Hypertrophic scar: Secondary | ICD-10-CM | POA: Diagnosis not present

## 2022-05-09 ENCOUNTER — Encounter (INDEPENDENT_AMBULATORY_CARE_PROVIDER_SITE_OTHER): Payer: Self-pay | Admitting: Family Medicine

## 2022-05-09 ENCOUNTER — Other Ambulatory Visit (INDEPENDENT_AMBULATORY_CARE_PROVIDER_SITE_OTHER): Payer: Medicare PPO

## 2022-05-09 ENCOUNTER — Other Ambulatory Visit: Payer: Self-pay | Admitting: Family Medicine

## 2022-05-09 ENCOUNTER — Ambulatory Visit (INDEPENDENT_AMBULATORY_CARE_PROVIDER_SITE_OTHER): Payer: Medicare PPO | Admitting: Family Medicine

## 2022-05-09 VITALS — BP 152/78 | HR 77 | Temp 99.1°F | Ht 66.0 in | Wt 190.6 lb

## 2022-05-09 DIAGNOSIS — I152 Hypertension secondary to endocrine disorders: Secondary | ICD-10-CM

## 2022-05-09 DIAGNOSIS — E1159 Type 2 diabetes mellitus with other circulatory complications: Secondary | ICD-10-CM

## 2022-05-09 DIAGNOSIS — Z7984 Long term (current) use of oral hypoglycemic drugs: Secondary | ICD-10-CM

## 2022-05-09 DIAGNOSIS — D649 Anemia, unspecified: Secondary | ICD-10-CM | POA: Diagnosis not present

## 2022-05-09 DIAGNOSIS — E1169 Type 2 diabetes mellitus with other specified complication: Secondary | ICD-10-CM | POA: Diagnosis not present

## 2022-05-09 DIAGNOSIS — E669 Obesity, unspecified: Secondary | ICD-10-CM | POA: Diagnosis not present

## 2022-05-09 DIAGNOSIS — Z6830 Body mass index (BMI) 30.0-30.9, adult: Secondary | ICD-10-CM

## 2022-05-09 DIAGNOSIS — E559 Vitamin D deficiency, unspecified: Secondary | ICD-10-CM | POA: Diagnosis not present

## 2022-05-10 LAB — CBC WITH DIFFERENTIAL/PLATELET
Basophils Absolute: 0.1 10*3/uL (ref 0.0–0.1)
Basophils Relative: 1 % (ref 0.0–3.0)
Eosinophils Absolute: 0.1 10*3/uL (ref 0.0–0.7)
Eosinophils Relative: 0.9 % (ref 0.0–5.0)
HCT: 34.9 % — ABNORMAL LOW (ref 36.0–46.0)
Hemoglobin: 11.3 g/dL — ABNORMAL LOW (ref 12.0–15.0)
Lymphocytes Relative: 24.2 % (ref 12.0–46.0)
Lymphs Abs: 2.8 10*3/uL (ref 0.7–4.0)
MCHC: 32.4 g/dL (ref 30.0–36.0)
MCV: 81.6 fl (ref 78.0–100.0)
Monocytes Absolute: 0.7 10*3/uL (ref 0.1–1.0)
Monocytes Relative: 6 % (ref 3.0–12.0)
Neutro Abs: 7.9 10*3/uL — ABNORMAL HIGH (ref 1.4–7.7)
Neutrophils Relative %: 67.9 % (ref 43.0–77.0)
Platelets: 429 10*3/uL — ABNORMAL HIGH (ref 150.0–400.0)
RBC: 4.28 Mil/uL (ref 3.87–5.11)
RDW: 15.5 % (ref 11.5–15.5)
WBC: 11.7 10*3/uL — ABNORMAL HIGH (ref 4.0–10.5)

## 2022-05-11 ENCOUNTER — Encounter: Payer: Self-pay | Admitting: Family Medicine

## 2022-05-11 DIAGNOSIS — D649 Anemia, unspecified: Secondary | ICD-10-CM

## 2022-05-13 ENCOUNTER — Other Ambulatory Visit: Payer: Self-pay

## 2022-05-13 DIAGNOSIS — D649 Anemia, unspecified: Secondary | ICD-10-CM

## 2022-05-13 NOTE — Telephone Encounter (Signed)
See below, nothing was mentioned about taking iron in result notes.

## 2022-05-22 ENCOUNTER — Ambulatory Visit (INDEPENDENT_AMBULATORY_CARE_PROVIDER_SITE_OTHER): Payer: Medicare PPO | Admitting: Pulmonary Disease

## 2022-05-22 ENCOUNTER — Encounter (HOSPITAL_BASED_OUTPATIENT_CLINIC_OR_DEPARTMENT_OTHER): Payer: Self-pay | Admitting: Pulmonary Disease

## 2022-05-22 VITALS — BP 142/68 | HR 86 | Temp 98.8°F | Ht 66.0 in | Wt 196.2 lb

## 2022-05-22 DIAGNOSIS — G4733 Obstructive sleep apnea (adult) (pediatric): Secondary | ICD-10-CM

## 2022-05-22 NOTE — Assessment & Plan Note (Signed)
Unable to tolerate CPAP and underwent implantation of hypoglossal nerve stimulator. She has significant neuropraxia today and will need more time to heal before we can activate implant.  Will schedule a follow-up in 6 weeks and hopefully can proceed with activation then. I discussed activation process and answered all questions She has postop checkup scheduled with Dr. Wilburn Cornelia in 1 week

## 2022-05-22 NOTE — Progress Notes (Signed)
   Subjective:    Patient ID: [name], female    DOB: Jan 08, 1951, 71 y.o.   MRN: [id number]  HPI 71 year old for follow-up of OSA  PMH -  hypertension, diabetes, stroke, diastolic dysfunction and aortic valve replacement, SIADH on demeclocycline  She was unable to tolerate CPAP and underwent implantation of hypoglossal nerve stimulator by Dr. Wilburn Cornelia on 10/31.  She feels like she has recovered well from the procedure.  She has not had postop follow-up.  This is scheduled for December 6. Husband accompanies and reports snoring and daytime tiredness. She has difficulty swallowing and had her esophagus stretched in the past   Significant tests/ events reviewed  Home sleep study July 2017 AHI 19/hr    HST 09/2021 showed moderate sleep apnea with AHI at 16.5/hour and SPO2 low at 77%  Review of Systems neg for any significant sore throat, dysphagia, itching, sneezing, nasal congestion or excess/ purulent secretions, fever, chills, sweats, unintended wt loss, pleuritic or exertional cp, hempoptysis, orthopnea pnd or change in chronic leg swelling. Also denies presyncope, palpitations, heartburn, abdominal pain, nausea, vomiting, diarrhea or change in bowel or urinary habits, dysuria,hematuria, rash, arthralgias, visual complaints, headache, numbness weakness or ataxia.     Objective:   Physical Exam  Gen. Pleasant, obese, in no distress ENT -tongue deviated to right on protrusion Neck: No JVD, no thyromegaly, no carotid bruits Lungs: no use of accessory muscles, no dullness to percussion, decreased without rales or rhonchi  Cardiovascular: Rhythm regular, heart sounds  normal, no murmurs or gallops, no peripheral edema Musculoskeletal: No deformities, no cyanosis or clubbing , no tremors       Assessment & Plan:

## 2022-05-23 ENCOUNTER — Other Ambulatory Visit: Payer: Medicare PPO

## 2022-05-23 NOTE — Progress Notes (Signed)
Chief Complaint:   OBESITY [name] is here to discuss her progress with her obesity treatment plan along with follow-up of her obesity related diagnoses. [name] is on the Category 2 Plan and states she is following her eating plan approximately 70% of the time. [name] states she is not exercising.  Today's visit was #: 85 Starting weight: 220 lbs Starting date: 01/19/2018 Today's weight: 190 lbs Today's date: 05/09/2022 Total lbs lost to date: 30 lbs Total lbs lost since last in-office visit: 1 lb  Interim History: She recently had sleep apnea surgery. Inspire insertion done by E[facility] at [facility].  Doing well with healing.  Has follow up with them at the end of the month.  Goes to see PCP today for labs.   Subjective:   1. Type 2 diabetes mellitus with obesity (HCC) FBS 130-140's.  She is only on metformin 500 mg every morning only.  She is doing great.  Last A1c was 6.3 on 03/05/2022.  She sees Dr Renne Crigler.   2. Vitamin D deficiency Reviewed Dr Chrissie Noa office visit note. 10/30 with Alkaline phosphate, abnormal.  Bone scan for Paget's disease was negative.  he is taking 5,000 IU OTC daily.  Vitamin D level 03/05/2022 was 62 an at goal.   3. Hypertension associated with type 2 diabetes mellitus ([place]) She is not checking her blood pressure at home.  Recently seen at Endo and blood pressure was WNL.  She is asymptomatic, no concerns.   Assessment/Plan:  No orders of the defined types were placed in this encounter.   Medications Discontinued During This Encounter  Medication Reason   HYDROcodone-acetaminophen ([place]) 5-325 MG tablet Completed Course     No orders of the defined types were placed in this encounter.    1. Type 2 diabetes mellitus with obesity ([place]) Change to taker her metformin with lunch daily.  No need for refill today.   2. Vitamin D deficiency Continue OTC Vitamin D at current dose.  Any bone density exams.  Start weight bearing exercises and walk  more.   3. Hypertension associated with type 2 diabetes mellitus ([place]) Patient will check her blood pressure at home 2-3 days per week and keep log.  Follow up with PCP for medication adjustment.  If continues to be >150.  Continue PNP, decrease salt intake, continue with weight loss.   4. Obesity, current BMI 30.8 Thanksgiving handouts given to patient after discussion had.  Focus on not gaining weight over the holidays.   [name] is currently in the action stage of change. As such, her goal is to continue with weight loss efforts. She has agreed to the Category 2 Plan.   Exercise goals: All adults should avoid inactivity. Some physical activity is better than none, and adults who participate in any amount of physical activity gain some health benefits.  Behavioral modification strategies: increasing lean protein intake and decreasing simple carbohydrates.  [name] has agreed to follow-up with our clinic in 3-4 weeks. She was informed of the importance of frequent follow-up visits to maximize her success with intensive lifestyle modifications for her multiple health conditions.   Objective:   Blood pressure (!) 152/78, pulse 77, temperature 99.1 F (37.3 C), height '5\' 6"'$  (1.676 m), weight 190 lb 9.6 oz (86.5 kg), SpO2 97 %. Body mass index is 30.76 kg/m.  General: Cooperative, alert, well developed, in no acute distress. HEENT: Conjunctivae and lids unremarkable. Cardiovascular: Regular rhythm.  Lungs: Normal work of breathing. Neurologic: No focal deficits.  Lab Results  Component Value Date   CREATININE 0.53 04/18/2022   BUN 19 04/18/2022   NA 134 (L) 04/18/2022   K 4.4 04/18/2022   CL 101 04/18/2022   CO2 22 04/18/2022   Lab Results  Component Value Date   ALT 13 04/03/2022   AST 24 04/03/2022   ALKPHOS 118 (H) 04/03/2022   BILITOT 0.5 04/03/2022   Lab Results  Component Value Date   HGBA1C 6.3 (H) 03/05/2022   HGBA1C 6.3 09/24/2021   HGBA1C 6.6 (H)  06/05/2021   HGBA1C 6.5 (H) 02/09/2021   HGBA1C 6.3 (H) 01/25/2021   Lab Results  Component Value Date   INSULIN 6.3 03/05/2022   INSULIN 12.9 06/05/2021   INSULIN 7.7 01/25/2021   INSULIN 13.1 04/20/2020   INSULIN 12.1 01/19/2018   Lab Results  Component Value Date   TSH 4.23 09/17/2021   Lab Results  Component Value Date   CHOL 144 06/05/2021   HDL 64 06/05/2021   LDLCALC 65 06/05/2021   LDLDIRECT 67.0 04/03/2022   TRIG 75 06/05/2021   CHOLHDL 2.3 06/05/2021   Lab Results  Component Value Date   VD25OH 62.2 03/05/2022   VD25OH 70.1 06/05/2021   VD25OH 74.5 01/25/2021   Lab Results  Component Value Date   WBC 11.7 (H) 05/09/2022   HGB 11.3 (L) 05/09/2022   HCT 34.9 (L) 05/09/2022   MCV 81.6 05/09/2022   PLT 429.0 (H) 05/09/2022   No results found for: "IRON", "TIBC", "FERRITIN"  Attestation Statements:   Reviewed by clinician on day of visit: allergies, medications, problem list, medical history, surgical history, family history, social history, and previous encounter notes.  I, Davy Pique, RMA, am acting as Location manager for [employer], DO.  I have reviewed the above documentation for accuracy and completeness, and I agree with the above. Marjory Sneddon, D.O.  The [place] was signed into law in 2016 which includes the topic of electronic health records.  This provides immediate access to information in MyChart.  This includes consultation notes, operative notes, office notes, lab results and pathology reports.  If you have any questions about what you read please let us know at your next visit so we can discuss your concerns and take corrective action if need be.  We are right here with you.

## 2022-05-28 DIAGNOSIS — M7062 Trochanteric bursitis, left hip: Secondary | ICD-10-CM | POA: Diagnosis not present

## 2022-06-12 ENCOUNTER — Other Ambulatory Visit: Payer: Medicare PPO

## 2022-06-12 ENCOUNTER — Other Ambulatory Visit (INDEPENDENT_AMBULATORY_CARE_PROVIDER_SITE_OTHER): Payer: Medicare PPO

## 2022-06-12 DIAGNOSIS — D649 Anemia, unspecified: Secondary | ICD-10-CM | POA: Diagnosis not present

## 2022-06-13 LAB — IBC + FERRITIN
Ferritin: 12 ng/mL (ref 10.0–291.0)
Iron: 48 ug/dL (ref 42–145)
Saturation Ratios: 10.6 % — ABNORMAL LOW (ref 20.0–50.0)
TIBC: 450.8 ug/dL — ABNORMAL HIGH (ref 250.0–450.0)
Transferrin: 322 mg/dL (ref 212.0–360.0)

## 2022-06-13 LAB — CBC WITH DIFFERENTIAL/PLATELET
Basophils Absolute: 0.1 10*3/uL (ref 0.0–0.1)
Basophils Relative: 0.7 % (ref 0.0–3.0)
Eosinophils Absolute: 0.1 10*3/uL (ref 0.0–0.7)
Eosinophils Relative: 0.8 % (ref 0.0–5.0)
HCT: 35.1 % — ABNORMAL LOW (ref 36.0–46.0)
Hemoglobin: 11.4 g/dL — ABNORMAL LOW (ref 12.0–15.0)
Lymphocytes Relative: 28.7 % (ref 12.0–46.0)
Lymphs Abs: 2.8 10*3/uL (ref 0.7–4.0)
MCHC: 32.4 g/dL (ref 30.0–36.0)
MCV: 82.1 fl (ref 78.0–100.0)
Monocytes Absolute: 0.8 10*3/uL (ref 0.1–1.0)
Monocytes Relative: 8.1 % (ref 3.0–12.0)
Neutro Abs: 6.1 10*3/uL (ref 1.4–7.7)
Neutrophils Relative %: 61.7 % (ref 43.0–77.0)
Platelets: 387 10*3/uL (ref 150.0–400.0)
RBC: 4.27 Mil/uL (ref 3.87–5.11)
RDW: 15.8 % — ABNORMAL HIGH (ref 11.5–15.5)
WBC: 9.9 10*3/uL (ref 4.0–10.5)

## 2022-06-18 ENCOUNTER — Other Ambulatory Visit: Payer: Self-pay | Admitting: *Deleted

## 2022-06-18 ENCOUNTER — Other Ambulatory Visit (INDEPENDENT_AMBULATORY_CARE_PROVIDER_SITE_OTHER): Payer: Medicare PPO

## 2022-06-18 DIAGNOSIS — D649 Anemia, unspecified: Secondary | ICD-10-CM

## 2022-06-18 LAB — FECAL OCCULT BLOOD, IMMUNOCHEMICAL: Fecal Occult Bld: NEGATIVE

## 2022-06-30 ENCOUNTER — Other Ambulatory Visit: Payer: Self-pay | Admitting: Family Medicine

## 2022-06-30 ENCOUNTER — Other Ambulatory Visit (HOSPITAL_BASED_OUTPATIENT_CLINIC_OR_DEPARTMENT_OTHER): Payer: Self-pay | Admitting: Physician Assistant

## 2022-06-30 DIAGNOSIS — I152 Hypertension secondary to endocrine disorders: Secondary | ICD-10-CM

## 2022-06-30 DIAGNOSIS — Z953 Presence of xenogenic heart valve: Secondary | ICD-10-CM

## 2022-06-30 DIAGNOSIS — I10 Essential (primary) hypertension: Secondary | ICD-10-CM

## 2022-06-30 DIAGNOSIS — Z79899 Other long term (current) drug therapy: Secondary | ICD-10-CM

## 2022-07-01 ENCOUNTER — Telehealth: Payer: Self-pay | Admitting: Nurse Practitioner

## 2022-07-01 DIAGNOSIS — I10 Essential (primary) hypertension: Secondary | ICD-10-CM

## 2022-07-01 DIAGNOSIS — E1159 Type 2 diabetes mellitus with other circulatory complications: Secondary | ICD-10-CM

## 2022-07-01 DIAGNOSIS — Z79899 Other long term (current) drug therapy: Secondary | ICD-10-CM

## 2022-07-01 DIAGNOSIS — Z953 Presence of xenogenic heart valve: Secondary | ICD-10-CM

## 2022-07-01 MED ORDER — AMLODIPINE BESYLATE 10 MG PO TABS: 10.0000 mg | ORAL_TABLET | Freq: Every day | ORAL | 2 refills | Status: DC

## 2022-07-01 MED ORDER — CLONIDINE HCL 0.1 MG PO TABS: 0.1000 mg | ORAL_TABLET | Freq: Every day | ORAL | 2 refills | Status: DC

## 2022-07-01 NOTE — Telephone Encounter (Signed)
*  STAT* If patient is at the pharmacy, call can be transferred to refill team.   1. Which medications need to be refilled? (please list name of each medication and dose if known) Clonidine and Amlodipine  2. Which pharmacy/location (including street and city if local pharmacy) is medication to be sent to? [facility] [place] [facility]  3. Do they need a 30 day or 90 day supply? 90 days and refills

## 2022-07-01 NOTE — Telephone Encounter (Signed)
Pt's medications were sent to pt's pharmacy as requested. Confirmation received.  

## 2022-07-02 ENCOUNTER — Encounter (HOSPITAL_BASED_OUTPATIENT_CLINIC_OR_DEPARTMENT_OTHER): Payer: Self-pay | Admitting: Pulmonary Disease

## 2022-07-02 ENCOUNTER — Ambulatory Visit (INDEPENDENT_AMBULATORY_CARE_PROVIDER_SITE_OTHER): Payer: Medicare PPO | Admitting: Pulmonary Disease

## 2022-07-02 VITALS — BP 116/68 | HR 67 | Temp 99.4°F | Ht 66.0 in | Wt 198.6 lb

## 2022-07-02 DIAGNOSIS — G4733 Obstructive sleep apnea (adult) (pediatric): Secondary | ICD-10-CM | POA: Diagnosis not present

## 2022-07-02 NOTE — Progress Notes (Signed)
   Subjective:    Patient ID: [name], female    DOB: 22-Jan-1951, 72 y.o.   MRN: [id number]  HPI  72 year old for follow-up of OSA   PMH -  hypertension, diabetes, stroke, diastolic dysfunction and aortic valve replacement, SIADH on demeclocycline   She was unable to tolerate CPAP and underwent implantation of hypoglossal nerve stimulator by Dr. Wilburn Cornelia on 10/31.    Initial office visit 05/22/2022 -she had significant neuropraxia and activation was deferred.  She presents for device activation today, accompanied by her husband. Denies any problems swallowing or with speech, surgical areas have healed well, she has a tendency for keloid formation  Bedtime is 10 PM, sleep latency about 30 minutes, 1-2 nocturnal awakenings for nocturia and wake up time can be as late as 10 AM  Significant tests/ events reviewed  Home sleep study July 2017 AHI 19/hr    HST 09/2021 showed moderate sleep apnea with AHI at 16.5/hour and SPO2 low at 77%  Review of Systems neg for any significant sore throat, dysphagia, itching, sneezing, nasal congestion or excess/ purulent secretions, fever, chills, sweats, unintended wt loss, pleuritic or exertional cp, hempoptysis, orthopnea pnd or change in chronic leg swelling. Also denies presyncope, palpitations, heartburn, abdominal pain, nausea, vomiting, diarrhea or change in bowel or urinary habits, dysuria,hematuria, rash, arthralgias, visual complaints, headache, numbness weakness or ataxia.     Objective:   Physical Exam  Gen. Pleasant, obese, in no distress ENT - no lesions, no post nasal drip, tip of tongue deviates mildly to right on protrusion Neck: No JVD, no thyromegaly, no carotid bruits, mild keloid Lungs: no use of accessory muscles, no dullness to percussion, decreased without rales or rhonchi  Cardiovascular: Rhythm regular, heart sounds  normal, no murmurs or gallops, no peripheral edema Musculoskeletal: No deformities, no cyanosis or  clubbing , no tremors        Assessment & Plan:

## 2022-07-02 NOTE — Patient Instructions (Signed)
Increase level on remote by 1 every week  Try to use every night  Duration 12 h, pause 15 mins, activation delay 30 mins

## 2022-07-02 NOTE — Assessment & Plan Note (Signed)
She still has mild residual neuropraxia but we proceeded with device activation today  Hypoglossal nerve stimulator was activated today.  Incision was checked, tongue protrusion was examined Programming : The activation workflow was followed 1.  Stimulation level : Sensation 1.2 V , functional level 1.3 V lower limit, upper limit 2.3 V 2.  Start delay 30 minutes, pause time 15 minutes, duration 12 hours 3.  Sensing waveform was analyzed for 3 minutes 4.  Sleep remote education was provided patient demonstrated competency with the remote and was aware of patient Instruction videos and sleep remote guide 5.  Patient was instructed to step up levels by 1 level (0.1 V ) every week 6.  Check-in visit in 1 month after activation visit to ensure that they are stepping up levels, using therapy " all night, every night" and to evaluate subjective benefit.   7.  Inspire titration sleep study will be scheduled 3 months after the activation visit

## 2022-07-03 ENCOUNTER — Ambulatory Visit (HOSPITAL_BASED_OUTPATIENT_CLINIC_OR_DEPARTMENT_OTHER): Payer: Medicare PPO | Admitting: Pulmonary Disease

## 2022-07-04 ENCOUNTER — Encounter (INDEPENDENT_AMBULATORY_CARE_PROVIDER_SITE_OTHER): Payer: Self-pay | Admitting: Adult Health

## 2022-07-04 ENCOUNTER — Ambulatory Visit (INDEPENDENT_AMBULATORY_CARE_PROVIDER_SITE_OTHER): Payer: Medicare PPO | Admitting: Adult Health

## 2022-07-04 VITALS — BP 128/71 | HR 69 | Temp 98.1°F | Ht 66.0 in | Wt 194.0 lb

## 2022-07-04 DIAGNOSIS — E669 Obesity, unspecified: Secondary | ICD-10-CM | POA: Diagnosis not present

## 2022-07-04 DIAGNOSIS — G4733 Obstructive sleep apnea (adult) (pediatric): Secondary | ICD-10-CM

## 2022-07-04 DIAGNOSIS — Z7984 Long term (current) use of oral hypoglycemic drugs: Secondary | ICD-10-CM | POA: Diagnosis not present

## 2022-07-04 DIAGNOSIS — Z6831 Body mass index (BMI) 31.0-31.9, adult: Secondary | ICD-10-CM

## 2022-07-04 DIAGNOSIS — E1169 Type 2 diabetes mellitus with other specified complication: Secondary | ICD-10-CM

## 2022-07-09 ENCOUNTER — Other Ambulatory Visit: Payer: Self-pay | Admitting: Family Medicine

## 2022-07-09 ENCOUNTER — Encounter: Payer: Self-pay | Admitting: Pulmonary Disease

## 2022-07-11 NOTE — Progress Notes (Addendum)
Chief Complaint:   OBESITY [name] is here to discuss her progress with her obesity treatment plan along with follow-up of her obesity related diagnoses. [name] is on the Category 2 Plan and states she is following her eating plan approximately 70% of the time. [name] states she is not exercising.  Today's visit was #: 50 Starting weight: 220 LBS Starting date: 01/19/2018 Today's weight: 194 LBS Today's date: 07/04/2022 Total lbs lost to date: 26 LBS Total lbs lost since last in-office visit: +4 LBS  Interim History:  [name] husbands 75th birthday was mid-December 2023, which required a 3 day celebration!  Health Goals for 2024 1) Current weight 194lbs, would ideally like to loss down to 170 lbs.  Subjective:   1. OSA (obstructive sleep apnea) 04/23/2022, inspire surgical procedure.   Involved an upper airway stimulation and an implantation of hypoglossal nerve stimulator.  She is followed by Dr. Wilburn Cornelia of ENT, and Dr. Elsworth Soho of pulmonary.  2. Type 2 diabetes mellitus with obesity ([place]) Lab Results  Component Value Date   HGBA1C 6.3 (H) 03/05/2022   HGBA1C 6.3 09/24/2021   HGBA1C 6.6 (H) 06/05/2021    Patient has continued to take metformin 500 mg full tablet in the morning only.  She is taking 1/2 tablet in the evening.   Assessment/Plan:   1. OSA (obstructive sleep apnea) Follow-up with pulmonary as directed.  2. Type 2 diabetes mellitus with obesity ([place]) Follow up with PCP.  Reduce sugar/CHO in diet. Increase regular activity.  3. Obesity, current BMI 31.3 [name] is currently in the action stage of change. As such, her goal is to continue with weight loss efforts. She has agreed to the Category 2 Plan.   Exercise goals: Older adults should follow the adult guidelines. When older adults cannot meet the adult guidelines, they should be as physically active as their abilities and conditions will allow.   Behavioral modification  strategies: increasing lean protein intake, decreasing simple carbohydrates, meal planning and cooking strategies, keeping healthy foods in the home, and planning for success.  [name] has agreed to follow-up with our clinic in 4 weeks. She was informed of the importance of frequent follow-up visits to maximize her success with intensive lifestyle modifications for her multiple health conditions.   Objective:   Blood pressure 128/71, pulse 69, temperature 98.1 F (36.7 C), height 5' 6"$  (1.676 m), weight 194 lb (88 kg), SpO2 100 %. Body mass index is 31.31 kg/m.  General: Cooperative, alert, well developed, in no acute distress. HEENT: Conjunctivae and lids unremarkable. Cardiovascular: Regular rhythm.  Lungs: Normal work of breathing. Neurologic: No focal deficits.   Lab Results  Component Value Date   CREATININE 0.53 04/18/2022   BUN 19 04/18/2022   NA 134 (L) 04/18/2022   K 4.4 04/18/2022   CL 101 04/18/2022   CO2 22 04/18/2022   Lab Results  Component Value Date   ALT 13 04/03/2022   AST 24 04/03/2022   ALKPHOS 118 (H) 04/03/2022   BILITOT 0.5 04/03/2022   Lab Results  Component Value Date   HGBA1C 6.3 (H) 03/05/2022   HGBA1C 6.3 09/24/2021   HGBA1C 6.6 (H) 06/05/2021   HGBA1C 6.5 (H) 02/09/2021   HGBA1C 6.3 (H) 01/25/2021   Lab Results  Component Value Date   INSULIN 6.3 03/05/2022   INSULIN 12.9 06/05/2021   INSULIN 7.7 01/25/2021   INSULIN 13.1 04/20/2020   INSULIN 12.1 01/19/2018   Lab Results  Component Value Date  TSH 4.23 09/17/2021   Lab Results  Component Value Date   CHOL 144 06/05/2021   HDL 64 06/05/2021   LDLCALC 65 06/05/2021   LDLDIRECT 67.0 04/03/2022   TRIG 75 06/05/2021   CHOLHDL 2.3 06/05/2021   Lab Results  Component Value Date   VD25OH 62.2 03/05/2022   VD25OH 70.1 06/05/2021   VD25OH 74.5 01/25/2021   Lab Results  Component Value Date   WBC 9.9 06/12/2022   HGB 11.4 (L) 06/12/2022   HCT 35.1 (L) 06/12/2022   MCV  82.1 06/12/2022   PLT 387.0 06/12/2022   Lab Results  Component Value Date   IRON 48 06/12/2022   TIBC 450.8 (H) 06/12/2022   FERRITIN 12.0 06/12/2022    Attestation Statements:   Reviewed by clinician on day of visit: allergies, medications, problem list, medical history, surgical history, family history, social history, and previous encounter notes.  I, Davy Pique, RMA, am acting as Location manager for Mina Marble, NP.  I have reviewed the above documentation for accuracy and completeness, and I agree with the above. -  Hayley Creegan d. Rossi Silvestro, NP-C

## 2022-07-20 ENCOUNTER — Other Ambulatory Visit: Payer: Self-pay | Admitting: Family Medicine

## 2022-07-22 DIAGNOSIS — Z7984 Long term (current) use of oral hypoglycemic drugs: Secondary | ICD-10-CM | POA: Diagnosis not present

## 2022-07-22 DIAGNOSIS — H524 Presbyopia: Secondary | ICD-10-CM | POA: Diagnosis not present

## 2022-07-22 DIAGNOSIS — Z961 Presence of intraocular lens: Secondary | ICD-10-CM | POA: Diagnosis not present

## 2022-07-22 DIAGNOSIS — E119 Type 2 diabetes mellitus without complications: Secondary | ICD-10-CM | POA: Diagnosis not present

## 2022-07-22 LAB — HM DIABETES EYE EXAM

## 2022-08-06 ENCOUNTER — Ambulatory Visit (INDEPENDENT_AMBULATORY_CARE_PROVIDER_SITE_OTHER): Payer: Medicare PPO | Admitting: Adult Health

## 2022-08-06 ENCOUNTER — Encounter (INDEPENDENT_AMBULATORY_CARE_PROVIDER_SITE_OTHER): Payer: Self-pay | Admitting: Adult Health

## 2022-08-06 DIAGNOSIS — Z6831 Body mass index (BMI) 31.0-31.9, adult: Secondary | ICD-10-CM

## 2022-08-06 DIAGNOSIS — I152 Hypertension secondary to endocrine disorders: Secondary | ICD-10-CM

## 2022-08-06 DIAGNOSIS — E1169 Type 2 diabetes mellitus with other specified complication: Secondary | ICD-10-CM

## 2022-08-06 DIAGNOSIS — E871 Hypo-osmolality and hyponatremia: Secondary | ICD-10-CM

## 2022-08-06 DIAGNOSIS — E1159 Type 2 diabetes mellitus with other circulatory complications: Secondary | ICD-10-CM | POA: Diagnosis not present

## 2022-08-06 DIAGNOSIS — Z7984 Long term (current) use of oral hypoglycemic drugs: Secondary | ICD-10-CM

## 2022-08-06 DIAGNOSIS — E669 Obesity, unspecified: Secondary | ICD-10-CM | POA: Diagnosis not present

## 2022-08-06 NOTE — Progress Notes (Signed)
Chief Complaint:   OBESITY [name] is here to discuss her progress with her obesity treatment plan along with follow-up of her obesity related diagnoses. [name] is on the Category 2 Plan and states she is following her eating plan approximately 65% of the time. [name] states she is not currently exercising.  Today's visit was #: 38 Starting weight: 220 lbs Starting date: 01/19/2018 Today's weight: 193 lbs Today's date: 08/06/2022 Total lbs lost to date: 27 Total lbs lost since last in-office visit: - 1lbs  Interim History:  Ms. Mcerlean reports increased pain and reduced immobility of R shoulder. She has had L shoulder replacement with Dr. Fredrich Romans Orthopedic Specailist.  She provided the following food recall that is typical of a day: Breakfast: Special K High Protein Cereal with Fair Life Milk Mid Morning Snack: Greek Yogurt Lunch: Salad with protein- either chicken, beef, or fish Dinner:Special K High Protein Cereal with Fair Life Milk- b/c she "bored" with making dinner.  Subjective:   1. Type 2 diabetes mellitus with obesity (HCC) Lab Results  Component Value Date   HGBA1C 6.3 (H) 03/05/2022   HGBA1C 6.3 09/24/2021   HGBA1C 6.6 (H) 06/05/2021   PCP manages Metformin therapy- 524m 1 tab at breakfast, 1/2 tab at dinner   2. Hyponatremia She denies change in mentation. She has decreased water intake to increase serum Na+ level. SHe also takes Na+ 3g  tablets-BID  3. Hypertension associated with type 2 diabetes mellitus (HCC) BP slightly elevated at OV. She denies CP/palpitations at present. She is on Norvasc 115mQD, Avapro 30061mD, Catapres 0.1mg85mS  Assessment/Plan:   1. Type 2 diabetes mellitus with obesity (HCC)Surryntinue Metformin therapy per PCP.  2. Hyponatremia Continue to limit water, Na+ 3g tablets- BID, and monitor for change in mentation.  3. Hypertension associated with type 2 diabetes mellitus (HCC) Continue Norvasc  10mg41m Avapro 300mg 35mCatapres 0.1mg QH8m4. Obesity, current BMI 31.2  Handouts: Dinner Ideas, Recipe Guide  [name] in the action stage of change. As such, her goal is to continue with weight loss efforts. She has agreed to the Category 2 Plan.   Exercise goals: Older adults should follow the adult guidelines. When older adults cannot meet the adult guidelines, they should be as physically active as their abilities and conditions will allow.   Behavioral modification strategies: increasing lean protein intake, decreasing simple carbohydrates, increasing vegetables, meal planning and cooking strategies, keeping healthy foods in the home, and planning for success.  [name] to follow-up with our clinic in 4 weeks. She was informed of the importance of frequent follow-up visits to maximize her success with intensive lifestyle modifications for her multiple health conditions.    Objective:   Blood pressure (!) 147/81, pulse 83, temperature 98.6 F (37 C), height 5' 6"$  (1.676 m), weight 193 lb (87.5 kg), SpO2 99 %. Body mass index is 31.15 kg/m.  General: Cooperative, alert, well developed, in no acute distress. HEENT: Conjunctivae and lids unremarkable. Cardiovascular: Regular rhythm.  Lungs: Normal work of breathing. Neurologic: No focal deficits.   Lab Results  Component Value Date   CREATININE 0.53 04/18/2022   BUN 19 04/18/2022   NA 134 (L) 04/18/2022   K 4.4 04/18/2022   CL 101 04/18/2022   CO2 22 04/18/2022   Lab Results  Component Value Date   ALT 13 04/03/2022   AST 24 04/03/2022   ALKPHOS 118 (H) 04/03/2022   BILITOT 0.5  04/03/2022   Lab Results  Component Value Date   HGBA1C 6.3 (H) 03/05/2022   HGBA1C 6.3 09/24/2021   HGBA1C 6.6 (H) 06/05/2021   HGBA1C 6.5 (H) 02/09/2021   HGBA1C 6.3 (H) 01/25/2021   Lab Results  Component Value Date   INSULIN 6.3 03/05/2022   INSULIN 12.9 06/05/2021   INSULIN 7.7 01/25/2021   INSULIN  13.1 04/20/2020   INSULIN 12.1 01/19/2018   Lab Results  Component Value Date   TSH 4.23 09/17/2021   Lab Results  Component Value Date   CHOL 144 06/05/2021   HDL 64 06/05/2021   LDLCALC 65 06/05/2021   LDLDIRECT 67.0 04/03/2022   TRIG 75 06/05/2021   CHOLHDL 2.3 06/05/2021   Lab Results  Component Value Date   VD25OH 62.2 03/05/2022   VD25OH 70.1 06/05/2021   VD25OH 74.5 01/25/2021   Lab Results  Component Value Date   WBC 9.9 06/12/2022   HGB 11.4 (L) 06/12/2022   HCT 35.1 (L) 06/12/2022   MCV 82.1 06/12/2022   PLT 387.0 06/12/2022   Lab Results  Component Value Date   IRON 48 06/12/2022   TIBC 450.8 (H) 06/12/2022   FERRITIN 12.0 06/12/2022   Attestation Statements:   Reviewed by clinician on day of visit: allergies, medications, problem list, medical history, surgical history, family history, social history, and previous encounter notes.   I have reviewed the above documentation for accuracy and completeness, and I agree with the above. -  [name] d. Krysteena Stalker, NP-C

## 2022-08-08 ENCOUNTER — Encounter (HOSPITAL_BASED_OUTPATIENT_CLINIC_OR_DEPARTMENT_OTHER): Payer: Self-pay | Admitting: Pulmonary Disease

## 2022-08-08 ENCOUNTER — Ambulatory Visit (HOSPITAL_BASED_OUTPATIENT_CLINIC_OR_DEPARTMENT_OTHER): Payer: Medicare PPO | Admitting: Pulmonary Disease

## 2022-08-08 VITALS — BP 124/62 | HR 68 | Temp 99.8°F | Ht 66.0 in | Wt 199.0 lb

## 2022-08-08 DIAGNOSIS — G4733 Obstructive sleep apnea (adult) (pediatric): Secondary | ICD-10-CM | POA: Diagnosis not present

## 2022-08-08 NOTE — Progress Notes (Signed)
   Subjective:    Patient ID: [name], female    DOB: [birth year], 72 y.o.   MRN: [id number]  HPI  72 year old for follow-up of OSA   PMH -  hypertension, diabetes, stroke, diastolic dysfunction and aortic valve replacement, SIADH on demeclocycline   She was unable to tolerate CPAP and underwent implantation of hypoglossal nerve stimulator by Dr. Wilburn Cornelia on 10/31.  07/02/22 Device activated Stimulation level : Sensation 1.2 V , functional level 1.3 V lower limit, upper limit 2.3 V Start delay 30 minutes, pause time 15 minutes, duration 12 hours  1 month post activation visit. She feels that she is resting well.  Husband accompanies and reports no snoring.  She denies tongue discomfort.  No problems swallowing. She has been able to increase level to level 4. She was not using the app on her phone .  She rarely uses the pause button at night. She is familiar with the remote control  Significant tests/ events reviewed  Home sleep study July 2017 AHI 19/hr    HST 09/2021 showed moderate sleep apnea with AHI at 16.5/hour and SPO2 low at 77%  Review of Systems neg for any significant sore throat, dysphagia, itching, sneezing, nasal congestion or excess/ purulent secretions, fever, chills, sweats, unintended wt loss, pleuritic or exertional cp, hempoptysis, orthopnea pnd or change in chronic leg swelling. Also denies presyncope, palpitations, heartburn, abdominal pain, nausea, vomiting, diarrhea or change in bowel or urinary habits, dysuria,hematuria, rash, arthralgias, visual complaints, headache, numbness weakness or ataxia.     Objective:   Physical Exam  Gen. Pleasant, well-nourished, in no distress ENT - no thrush, no pallor/icterus,no post nasal drip, good tongue protrusion Neck: No JVD, no thyromegaly, no carotid bruits Lungs: no use of accessory muscles, no dullness to percussion, clear without rales or rhonchi  Cardiovascular: Rhythm regular, heart sounds  normal, no  murmurs or gallops, no peripheral edema Musculoskeletal: No deformities, no cyanosis, clubbing        Assessment & Plan:

## 2022-08-08 NOTE — Patient Instructions (Signed)
You are at level 4  Keep increasing by 1 level every week until you have tongue discomfort You can stop at that level/ dial back if this happens

## 2022-08-08 NOTE — Assessment & Plan Note (Signed)
She seems to be adjusting well to hypoglossal nerve stimulation.  She has increased her level for which is 1.6 V.  She is very compliant on review of her download.  She has not progressed to level for the last 10 days.  I asked her to keep increasing by 1 level every week until she has discomfort at which point she can stop or stepdown level. We will plan on a follow-up visit in 4 weeks and schedule titration study  Hypoglossal nerve stimulator was reassessed today.  Goal of the follow-up visit was to ensure good attendance, good subjective benefit, good tongue motion and good sense lead waveforms .  Download was reviewed and usage appears to be up to 10 hours average per night.  Spouse reports that snoring is decreased and she has more energy.  She denies any discomfort.  She is at level 4 = 1.6V  Check-in visit in 1 month presleep study to ensure that they are stepping up levels, using therapy " all night, every night" and to evaluate subjective benefit.     Inspire titration sleep study will be scheduled 2 to 4 weeks after the prestudy visit to verify efficacy of device

## 2022-08-13 ENCOUNTER — Encounter: Payer: Self-pay | Admitting: Pulmonary Disease

## 2022-08-16 DIAGNOSIS — M7551 Bursitis of right shoulder: Secondary | ICD-10-CM | POA: Diagnosis not present

## 2022-08-19 ENCOUNTER — Other Ambulatory Visit: Payer: Self-pay | Admitting: Family Medicine

## 2022-08-20 ENCOUNTER — Encounter (HOSPITAL_BASED_OUTPATIENT_CLINIC_OR_DEPARTMENT_OTHER): Payer: Medicare PPO | Admitting: Pulmonary Disease

## 2022-08-27 ENCOUNTER — Ambulatory Visit (HOSPITAL_COMMUNITY): Payer: Medicare PPO | Attending: Cardiovascular Disease

## 2022-08-27 DIAGNOSIS — Z953 Presence of xenogenic heart valve: Secondary | ICD-10-CM | POA: Diagnosis not present

## 2022-08-27 LAB — ECHOCARDIOGRAM COMPLETE
AR max vel: 1.62 cm2
AV Area VTI: 1.76 cm2
AV Area mean vel: 1.47 cm2
AV Mean grad: 15 mmHg
AV Peak grad: 22.3 mmHg
Ao pk vel: 2.36 m/s
Area-P 1/2: 3.89 cm2
P 1/2 time: 301 msec
S' Lateral: 2.7 cm

## 2022-08-28 ENCOUNTER — Ambulatory Visit: Payer: Medicare PPO | Attending: Orthopedic Surgery | Admitting: Physical Therapy

## 2022-08-28 ENCOUNTER — Telehealth: Payer: Self-pay | Admitting: *Deleted

## 2022-08-28 ENCOUNTER — Encounter: Payer: Self-pay | Admitting: Physical Therapy

## 2022-08-28 DIAGNOSIS — M25811 Other specified joint disorders, right shoulder: Secondary | ICD-10-CM | POA: Diagnosis not present

## 2022-08-28 DIAGNOSIS — Z96612 Presence of left artificial shoulder joint: Secondary | ICD-10-CM | POA: Diagnosis not present

## 2022-08-28 DIAGNOSIS — M5441 Lumbago with sciatica, right side: Secondary | ICD-10-CM | POA: Insufficient documentation

## 2022-08-28 DIAGNOSIS — M25612 Stiffness of left shoulder, not elsewhere classified: Secondary | ICD-10-CM | POA: Diagnosis not present

## 2022-08-28 DIAGNOSIS — Z953 Presence of xenogenic heart valve: Secondary | ICD-10-CM

## 2022-08-28 DIAGNOSIS — R600 Localized edema: Secondary | ICD-10-CM | POA: Insufficient documentation

## 2022-08-28 DIAGNOSIS — M6281 Muscle weakness (generalized): Secondary | ICD-10-CM | POA: Diagnosis not present

## 2022-08-28 DIAGNOSIS — M5442 Lumbago with sciatica, left side: Secondary | ICD-10-CM | POA: Insufficient documentation

## 2022-08-28 DIAGNOSIS — M25512 Pain in left shoulder: Secondary | ICD-10-CM | POA: Insufficient documentation

## 2022-08-28 DIAGNOSIS — M25611 Stiffness of right shoulder, not elsewhere classified: Secondary | ICD-10-CM | POA: Diagnosis not present

## 2022-08-28 DIAGNOSIS — T8203XA Leakage of heart valve prosthesis, initial encounter: Secondary | ICD-10-CM

## 2022-08-28 NOTE — Telephone Encounter (Signed)
The patient has been notified of the result and verbalized understanding.  All questions (if any) were answered.  Pt aware that we will order for her to get a repeat echo done in one year for surveillance.  Pt aware that I will place the order for the repeat echo in one year in the system and send a message to our Echo Scheduler to call her back to arrange this appt.   Pt verbalized understanding and agrees with this plan.

## 2022-08-28 NOTE — Telephone Encounter (Signed)
-----   Message from Freada Bergeron, MD sent at 08/27/2022  8:32 PM EST ----- Her echo shows normal pumping function. Her aortic valve prosthesis is working well. There continues to be mild to moderate leakiness of her valve which is stable from prior echoes. Will continue with annual monitoring.

## 2022-08-28 NOTE — Therapy (Signed)
OUTPATIENT PHYSICAL THERAPY SHOULDER EVALUATION   Patient Name: [name] MRN: [id number] DOB:[birth year], 72 y.o., female Today's Date: 08/28/2022  END OF SESSION:  PT End of Session - 08/28/22 1629     Visit Number 1    Date for PT Re-Evaluation 11/20/22    PT Start Time 1545    PT Stop Time 1624    PT Time Calculation (min) 39 min    Activity Tolerance Patient limited by pain    Behavior During Therapy WFL for tasks assessed/performed             Past Medical History:  Diagnosis Date   Allergy    Anxiety    Arthritis    back & knee   Asthma     mild per pt shows up with resp illness   Colon polyps    Constipation    Diabetes mellitus without complication (HCC)    Gallstones    Gastric polyps    Gastroparesis    GERD (gastroesophageal reflux disease)    Headache    sinus headaches and migraines at times   Heart murmur    History of migraine headaches    HTN (hypertension)    Hyperlipidemia    IBS (irritable bowel syndrome)    Joint pain    Lumbar disc disease    PONV (postoperative nausea and vomiting)    S/P aortic valve replacement with bioprosthetic valve 08/23/2016   25 mm Edwards Intuity rapid-deployment bovine pericardial tissue valve via partial upper mini sternotomy   Sleep apnea    does not use CPAP   TIA (transient ischemic attack)    TIA (transient ischemic attack)    hx of per pt   Past Surgical History:  Procedure Laterality Date   ABDOMINAL HYSTERECTOMY     AORTIC VALVE REPLACEMENT N/A 08/23/2016   Procedure: AORTIC VALVE REPLACEMENT (AVR) - using partial Upper Sternotomy- 68m Edwards Intuity Aortic Valve used;  Surgeon: CRexene Alberts MD;  [place] [facility]  Service: Open Heart Surgery;  Laterality: N/A;   BLADDER SUSPENSION  2007   BREAST ENHANCEMENT SURGERY  1980   CARPAL TUNNEL RELEASE Bilateral    COLONOSCOPY     DORSAL COMPARTMENT RELEASE Right 09/15/2014   Procedure: RIGHT WRIST DEQUERVAINS RELEASE ;  Surgeon: DKathryne Hitch MD;  [place] [facility]  Service: Orthopedics;  Laterality: Right;   DRUG INDUCED ENDOSCOPY N/A 02/19/2022   Procedure: DRUG INDUCED SLEEP ENDOSCOPY;  Surgeon: SJerrell Belfast MD;  [place] [facility]  Service: ENT;  Laterality: N/A;   ESOPHAGOGASTRODUODENOSCOPY     EYE SURGERY     cataract surgery   IMPLANTATION OF HYPOGLOSSAL NERVE STIMULATOR Right 04/23/2022   Procedure: IMPLANTATION OF HYPOGLOSSAL NERVE STIMULATOR;  Surgeon: SJerrell Belfast MD;  [place] [facility]  Service: ENT;  Laterality: Right;   KNEE ARTHROSCOPY  04/15/2012   Procedure: ARTHROSCOPY KNEE;  Surgeon: DNinetta Lights MD;  [place] [facility]  Service: Orthopedics;  Laterality: Right;   LAPAROSCOPIC CHOLECYSTECTOMY     LASIK     OOPHORECTOMY     PALATE TO GINGIVA GRAFT  2017   REVERSE SHOULDER ARTHROPLASTY Left 02/13/2021   Procedure: REVERSE SHOULDER ARTHROPLASTY;  Surgeon: LMarchia Bond MD;  [facility];  Service: Orthopedics;  Laterality: Left;   RIGHT/LEFT HEART CATH AND CORONARY ANGIOGRAPHY N/A 08/02/2016   Procedure: Right/Left Heart Cath and Coronary Angiography;  Surgeon: MSherren Mocha MD;  [facility];  Service:  Cardiovascular;  Laterality: N/A;   ROTATOR CUFF REPAIR Left    TEE WITHOUT CARDIOVERSION N/A 08/23/2016   Procedure: TRANSESOPHAGEAL ECHOCARDIOGRAM (TEE);  Surgeon: Rexene Alberts, MD;  Location: [place];  Service: Open Heart Surgery;  Laterality: N/A;   TOTAL KNEE ARTHROPLASTY  04/15/2012   Procedure: TOTAL KNEE ARTHROPLASTY;  Surgeon: [name], MD;  Location: [place];  Service: Orthopedics;  Laterality: Right;   TRIGGER FINGER RELEASE Right 04/20/2015   Procedure: RIGHT TRIGGER FINGER RELEASE (TENDON SHEALTH INCISION) ;  Surgeon: [name], MD;  Location: [place];  Service: Orthopedics;  Laterality: Right;   Patient Active Problem List   Diagnosis Date Noted   Class 2 severe obesity with  serious comorbidity and body mass index (BMI) of 36.0 to 36.9 in adult ([place]) 05/09/2022   Type 2 diabetes mellitus with obesity ([place]) 05/09/2022   Hypertension associated with type 2 diabetes mellitus ([place]) 05/09/2022   SOB (shortness of breath) 12/10/2021   S/P reverse total shoulder arthroplasty, left 02/13/2021   Idiopathic hyperphosphatasia 08/03/2019   Vitamin D deficiency 06/04/2018   Hyponatremia 02/17/2018   History of CVA in adulthood 08/13/2017   Blurry vision 08/13/2017   Primary hypertension 01/02/2017   S/P minimally invasive aortic valve replacement with bioprosthetic valve 123XX123   Diastolic dysfunction    Osteopenia 05/27/2016   OSA (obstructive sleep apnea) 02/27/2016   Bruxism 10/23/2015   Elevated alkaline phosphatase level 05/16/2015   Atypical chest pain 11/10/2014   Acne 05/12/2014   Diabetes mellitus ([place]) 05/12/2014   GERD (gastroesophageal reflux disease) 03/10/2014   Generalized anxiety disorder 03/10/2014   Migraines 03/10/2014   EUSTACHIAN TUBE DYSFUNCTION, CHRONIC 06/14/2010   Irritable bowel syndrome 12/15/2008   Low back pain 08/11/2008   Hyperlipidemia associated with type 2 diabetes mellitus ([place]) 02/09/2008   Allergic rhinitis 02/12/2007   Asthma 02/12/2007    PCP: Garret Reddish O,MD  REFERRING PROVIDER: Marchia Bond, MD  REFERRING DIAG: Rt Shoulder impingement   THERAPY DIAG:  Stiffness of right shoulder, not elsewhere classified  Muscle weakness (generalized)  Impingement of right shoulder  Rationale for Evaluation and Treatment: Rehabilitation  ONSET DATE: 08/19/22  SUBJECTIVE:                                                                                                                                                                                      SUBJECTIVE STATEMENT: Patient reports that several weeks ago her R arm started hurting, just distal to shoulder. No fall or known MOI. She can reach up overhead, but it  hurts when she is holding something-glass, holding hair dryer or curling iron and reaching  back to back of head. She had an injection 08/16/22, but has no relief  PERTINENT HISTORY: L TSR 2022, R TKR 2013, heart valve replacement, just returned to Dr with good report.  PAIN:  Are you having pain? Yes: NPRS scale: 6/10 Pain location: R shoulder and proximal arm Pain description: sharp Aggravating factors: raising her arm with resistance- object, using arm to rotate her bra around. Relieving factors: Not using her arm, don't sleep on R.  PRECAUTIONS: None  WEIGHT BEARING RESTRICTIONS: No  FALLS:  Has patient fallen in last 6 months? No  LIVING ENVIRONMENT: Lives with: lives with their spouse Lives in: House/apartment Stairs: Yes: External: 5 steps; on left going up Has following equipment at home: None  OCCUPATION: Retired. Trying to lose weight, so wants/needs to exercise more, but the shoulder pain limits her ability to exercise.  PLOF: Independent  PATIENT GOALS:Decrease pain. Return to using her R arm wihtout pain.  NEXT MD VISIT: 09/16/22  OBJECTIVE:   DIAGNOSTIC FINDINGS:  X ray 2/27/24showed preservation of the bony architecure of the R shoulder   SURVEYS:  FOTO 48  COGNITION: Overall cognitive status: Within functional limits for tasks assessed     SENSATION: WFL  POSTURE: Slightly forward head, R shoulder rounded forward and elevated, flattened spinal curves, mildly winging scapulae  UPPER EXTREMITY ROM: L WFL, Active elevation mildly limited. Elbow and wrist WFL B.  Active ROM Right eval Left eval  Shoulder flexion 102   Shoulder extension 102   Shoulder abduction    Shoulder adduction    Shoulder internal rotation WNL   Shoulder external rotation 32   Elbow flexion    Elbow extension    Wrist flexion    Wrist extension    Wrist ulnar deviation    Wrist radial deviation    Wrist pronation    Wrist supination    (Blank rows = not  tested)  UPPER EXTREMITY MMT:  MMT Right eval Left eval  Shoulder flexion    Shoulder extension    Shoulder abduction    Shoulder adduction    Shoulder internal rotation    Shoulder external rotation    Middle trapezius    Lower trapezius    Elbow flexion    Elbow extension    Wrist flexion    Wrist extension    Wrist ulnar deviation    Wrist radial deviation    Wrist pronation    Wrist supination    Grip strength (lbs)    (Blank rows = not tested)  SHOULDER SPECIAL TESTS: Impingement tests: Hawkins/Kennedy impingement test: positive  and Painful arc test: positive  Rotator cuff assessment: Drop arm test: positive , Empty can test: positive , Full can test: positive , and Gerber lift off test: negative Biceps assessment: Speed's test: positive   JOINT MOBILITY TESTING:  R humeral glide limited, most in post glide, also inferior glide.  PALPATION:  TTP R cerv paraspinals, up traps, medial scapular border. Tightness noted in R cerv paraspinals, scalenes, UT, LS, pects. TTP supraspinatus tendon and long head of biceps tendon, R.   TODAY'S TREATMENT:  DATE:  08/28/22 Education  PATIENT EDUCATION: Education details: POC Person educated: Patient Education method: Customer service manager Education comprehension: verbalized understanding  HOME EXERCISE PROGRAM: TBD  ASSESSMENT:  CLINICAL IMPRESSION: Patient is a 72 y.o. who was seen today for physical therapy evaluation and treatment for R shoulder impingement. She is very TTP, demonstrates Positive tests for shoulder impingement and tendonitis in supraspinatus and biceps tendons. She shows decreased joint mobility and weakness in all upper R quadrant muscles. She will benefit from PT to address her pain, weakness, incorrect mechanics in the shoulder in order to restore a more normal  movement pattern and give relief to her impingement with LTG of decreased pain during her normal daily activities.  OBJECTIVE IMPAIRMENTS: decreased activity tolerance, decreased coordination, decreased ROM, decreased strength, impaired flexibility, impaired UE functional use, improper body mechanics, postural dysfunction, and pain.   ACTIVITY LIMITATIONS: carrying, lifting, sleeping, reach over head, and hygiene/grooming  PARTICIPATION LIMITATIONS: cleaning, laundry, community activity, and yard work  PERSONAL FACTORS: Past/current experiences are also affecting patient's functional outcome.   REHAB POTENTIAL: Good  CLINICAL DECISION MAKING: Stable/uncomplicated  EVALUATION COMPLEXITY: low   GOALS: Goals reviewed with patient? Yes  SHORT TERM GOALS: Target date: 09/09/22  I with initial HEP Baseline: Goal status: INITIAL  LONG TERM GOALS: Target date: 11/20/22  I with final HEP Baseline:  Goal status: INITIAL  2.  Patient will perform AROM of R shoulder at least 120 elevation, with<3/10 pain. Baseline: 102, with increased pain Goal status: INITIAL  3.  Increase R shoulder strength to at least 4-/5 Baseline: Unable to tolerate resistance due to pain Goal status: INITIAL  4.  Increase FOTO score to at least 61 Baseline: 48 Goal status: INITIAL  5.  Patient will be able to manage styling her hair and putting on her bra with R shoulder pain < 3/10 Baseline: 8/10 Goal status: INITIAL  PLAN:  PT FREQUENCY: 1-2x/week  PT DURATION: 12 weeks  PLANNED INTERVENTIONS: Therapeutic exercises, Therapeutic activity, Neuromuscular re-education, Balance training, Gait training, Patient/Family education, Self Care, Joint mobilization, Dry Needling, Electrical stimulation, Cryotherapy, Moist heat, Taping, Ionotophoresis '4mg'$ /ml Dexamethasone, and Manual therapy  PLAN FOR NEXT SESSION: HEP, STM, acute pain and inflammation treatment.   Marcelina Morel, DPT 08/28/2022, 4:37 PM

## 2022-09-04 ENCOUNTER — Ambulatory Visit: Payer: Medicare PPO | Admitting: Physical Therapy

## 2022-09-04 ENCOUNTER — Encounter (HOSPITAL_BASED_OUTPATIENT_CLINIC_OR_DEPARTMENT_OTHER): Payer: Medicare PPO | Admitting: Pulmonary Disease

## 2022-09-04 ENCOUNTER — Encounter: Payer: Self-pay | Admitting: Physical Therapy

## 2022-09-04 DIAGNOSIS — M6281 Muscle weakness (generalized): Secondary | ICD-10-CM | POA: Diagnosis not present

## 2022-09-04 DIAGNOSIS — M25512 Pain in left shoulder: Secondary | ICD-10-CM | POA: Diagnosis not present

## 2022-09-04 DIAGNOSIS — M25811 Other specified joint disorders, right shoulder: Secondary | ICD-10-CM | POA: Diagnosis not present

## 2022-09-04 DIAGNOSIS — Z96612 Presence of left artificial shoulder joint: Secondary | ICD-10-CM

## 2022-09-04 DIAGNOSIS — M25612 Stiffness of left shoulder, not elsewhere classified: Secondary | ICD-10-CM | POA: Diagnosis not present

## 2022-09-04 DIAGNOSIS — M5441 Lumbago with sciatica, right side: Secondary | ICD-10-CM | POA: Diagnosis not present

## 2022-09-04 DIAGNOSIS — R600 Localized edema: Secondary | ICD-10-CM

## 2022-09-04 DIAGNOSIS — M25611 Stiffness of right shoulder, not elsewhere classified: Secondary | ICD-10-CM

## 2022-09-04 DIAGNOSIS — M5442 Lumbago with sciatica, left side: Secondary | ICD-10-CM | POA: Diagnosis not present

## 2022-09-04 NOTE — Therapy (Signed)
OUTPATIENT PHYSICAL THERAPY SHOULDER EVALUATION   Patient Name: [name] MRN: [id number] DOB:10/24/1950, 72 y.o., female Today's Date: 09/04/2022  END OF SESSION:  PT End of Session - 09/04/22 1301     Visit Number 2    Date for PT Re-Evaluation 11/20/22    PT Start Time 1300    PT Stop Time 1345    PT Time Calculation (min) 45 min    Activity Tolerance Patient limited by pain    Behavior During Therapy [facility] for tasks assessed/performed             Past Medical History:  Diagnosis Date   Allergy    Anxiety    Arthritis    back & knee   Asthma     mild per pt shows up with resp illness   Colon polyps    Constipation    Diabetes mellitus without complication (HCC)    Gallstones    Gastric polyps    Gastroparesis    GERD (gastroesophageal reflux disease)    Headache    sinus headaches and migraines at times   Heart murmur    History of migraine headaches    HTN (hypertension)    Hyperlipidemia    IBS (irritable bowel syndrome)    Joint pain    Lumbar disc disease    PONV (postoperative nausea and vomiting)    S/P aortic valve replacement with bioprosthetic valve 08/23/2016   25 mm Edwards Intuity rapid-deployment bovine pericardial tissue valve via partial upper mini sternotomy   Sleep apnea    does not use CPAP   TIA (transient ischemic attack)    TIA (transient ischemic attack)    hx of per pt   Past Surgical History:  Procedure Laterality Date   ABDOMINAL HYSTERECTOMY     AORTIC VALVE REPLACEMENT N/A 08/23/2016   Procedure: AORTIC VALVE REPLACEMENT (AVR) - using partial Upper Sternotomy- 56m Edwards Intuity Aortic Valve used;  Surgeon: CRexene Alberts MD;  [facility]  Service: Open Heart Surgery;  Laterality: N/A;   BLADDER SUSPENSION  2007   BREAST ENHANCEMENT SURGERY  1980   CARPAL TUNNEL RELEASE Bilateral    COLONOSCOPY     DORSAL COMPARTMENT RELEASE Right 09/15/2014   Procedure: RIGHT WRIST DEQUERVAINS RELEASE ;  Surgeon: DKathryne Hitch MD;  [place] [facility]  Service: Orthopedics;  Laterality: Right;   DRUG INDUCED ENDOSCOPY N/A 02/19/2022   Procedure: DRUG INDUCED SLEEP ENDOSCOPY;  Surgeon: SJerrell Belfast MD;  [place] [facility]  Service: ENT;  Laterality: N/A;   ESOPHAGOGASTRODUODENOSCOPY     EYE SURGERY     cataract surgery   IMPLANTATION OF HYPOGLOSSAL NERVE STIMULATOR Right 04/23/2022   Procedure: IMPLANTATION OF HYPOGLOSSAL NERVE STIMULATOR;  Surgeon: SJerrell Belfast MD;  [place] [facility]  Service: ENT;  Laterality: Right;   KNEE ARTHROSCOPY  04/15/2012   Procedure: ARTHROSCOPY KNEE;  Surgeon: DNinetta Lights MD;  [place] [facility]  Service: Orthopedics;  Laterality: Right;   LAPAROSCOPIC CHOLECYSTECTOMY     LASIK     OOPHORECTOMY     PALATE TO GINGIVA GRAFT  2017   REVERSE SHOULDER ARTHROPLASTY Left 02/13/2021   Procedure: REVERSE SHOULDER ARTHROPLASTY;  Surgeon: LMarchia Bond MD;  [facility];  Service: Orthopedics;  Laterality: Left;   RIGHT/LEFT HEART CATH AND CORONARY ANGIOGRAPHY N/A 08/02/2016   Procedure: Right/Left Heart Cath and Coronary Angiography;  Surgeon: MSherren Mocha MD;  [facility];  Service:  Cardiovascular;  Laterality: N/A;   ROTATOR CUFF REPAIR Left    TEE WITHOUT CARDIOVERSION N/A 08/23/2016   Procedure: TRANSESOPHAGEAL ECHOCARDIOGRAM (TEE);  Surgeon: Rexene Alberts, MD;  Location: [place];  Service: Open Heart Surgery;  Laterality: N/A;   TOTAL KNEE ARTHROPLASTY  04/15/2012   Procedure: TOTAL KNEE ARTHROPLASTY;  Surgeon: [name], MD;  Location: [place];  Service: Orthopedics;  Laterality: Right;   TRIGGER FINGER RELEASE Right 04/20/2015   Procedure: RIGHT TRIGGER FINGER RELEASE (TENDON SHEALTH INCISION) ;  Surgeon: [name], MD;  Location: [place];  Service: Orthopedics;  Laterality: Right;   Patient Active Problem List   Diagnosis Date Noted   Class 2 severe obesity with  serious comorbidity and body mass index (BMI) of 36.0 to 36.9 in adult [facility]) 05/09/2022   Type 2 diabetes mellitus with obesity ([place]) 05/09/2022   Hypertension associated with type 2 diabetes mellitus ([place]) 05/09/2022   SOB (shortness of breath) 12/10/2021   S/P reverse total shoulder arthroplasty, left 02/13/2021   Idiopathic hyperphosphatasia 08/03/2019   Vitamin D deficiency 06/04/2018   Hyponatremia 02/17/2018   History of CVA in adulthood 08/13/2017   Blurry vision 08/13/2017   Primary hypertension 01/02/2017   S/P minimally invasive aortic valve replacement with bioprosthetic valve 123XX123   Diastolic dysfunction    Osteopenia 05/27/2016   OSA (obstructive sleep apnea) 02/27/2016   Bruxism 10/23/2015   Elevated alkaline phosphatase level 05/16/2015   Atypical chest pain 11/10/2014   Acne 05/12/2014   Diabetes mellitus ([place]) 05/12/2014   GERD (gastroesophageal reflux disease) 03/10/2014   Generalized anxiety disorder 03/10/2014   Migraines 03/10/2014   EUSTACHIAN TUBE DYSFUNCTION, CHRONIC 06/14/2010   Irritable bowel syndrome 12/15/2008   Low back pain 08/11/2008   Hyperlipidemia associated with type 2 diabetes mellitus ([place]) 02/09/2008   Allergic rhinitis 02/12/2007   Asthma 02/12/2007    PCP: Garret Reddish O,MD  REFERRING PROVIDER: Marchia Bond, MD  REFERRING DIAG: Rt Shoulder impingement   THERAPY DIAG:  Stiffness of right shoulder, not elsewhere classified  Muscle weakness (generalized)  Impingement of right shoulder  Stiffness of left shoulder, not elsewhere classified  S/P reverse total shoulder arthroplasty, left  Acute bilateral low back pain with bilateral sciatica  Localized edema  Acute pain of left shoulder  Rationale for Evaluation and Treatment: Rehabilitation  ONSET DATE: 08/19/22  SUBJECTIVE:                                                                                                                                                                                       SUBJECTIVE STATEMENT: Patient reports that several weeks ago her R  arm started hurting, just distal to shoulder. No fall or known MOI. She can reach up overhead, but it hurts when she is holding something-glass, holding hair dryer or curling iron and reaching back to back of head. She had an injection 08/16/22, but has no relief  PERTINENT HISTORY: L TSR 2022, R TKR 2013, heart valve replacement, just returned to Dr with good report.  PAIN:  Are you having pain? Yes: NPRS scale: 6/10 Pain location: R shoulder and proximal arm Pain description: sharp Aggravating factors: raising her arm with resistance- object, using arm to rotate her bra around. Relieving factors: Not using her arm, don't sleep on R.  PRECAUTIONS: None  WEIGHT BEARING RESTRICTIONS: No  FALLS:  Has patient fallen in last 6 months? No  LIVING ENVIRONMENT: Lives with: lives with their spouse Lives in: House/apartment Stairs: Yes: External: 5 steps; on left going up Has following equipment at home: None  OCCUPATION: Retired. Trying to lose weight, so wants/needs to exercise more, but the shoulder pain limits her ability to exercise.  PLOF: Independent  PATIENT GOALS:Decrease pain. Return to using her R arm wihtout pain.  NEXT MD VISIT: 09/16/22  OBJECTIVE:   DIAGNOSTIC FINDINGS:  X ray 2/27/24showed preservation of the bony architecure of the R shoulder   SURVEYS:  FOTO 48  COGNITION: Overall cognitive status: Within functional limits for tasks assessed     SENSATION: WFL  POSTURE: Slightly forward head, R shoulder rounded forward and elevated, flattened spinal curves, mildly winging scapulae  UPPER EXTREMITY ROM: L WFL, Active elevation mildly limited. Elbow and wrist WFL B.  Active ROM Right eval Left eval  Shoulder flexion 102   Shoulder extension 102   Shoulder abduction    Shoulder adduction    Shoulder internal rotation WNL   Shoulder external  rotation 32   Elbow flexion    Elbow extension    Wrist flexion    Wrist extension    Wrist ulnar deviation    Wrist radial deviation    Wrist pronation    Wrist supination    (Blank rows = not tested)  UPPER EXTREMITY MMT:  MMT Right eval Left eval  Shoulder flexion    Shoulder extension    Shoulder abduction    Shoulder adduction    Shoulder internal rotation    Shoulder external rotation    Middle trapezius    Lower trapezius    Elbow flexion    Elbow extension    Wrist flexion    Wrist extension    Wrist ulnar deviation    Wrist radial deviation    Wrist pronation    Wrist supination    Grip strength (lbs)    (Blank rows = not tested)  SHOULDER SPECIAL TESTS: Impingement tests: Hawkins/Kennedy impingement test: positive  and Painful arc test: positive  Rotator cuff assessment: Drop arm test: positive , Empty can test: positive , Full can test: positive , and Gerber lift off test: negative Biceps assessment: Speed's test: positive   JOINT MOBILITY TESTING:  R humeral glide limited, most in post glide, also inferior glide.  PALPATION:  TTP R cerv paraspinals, up traps, medial scapular border. Tightness noted in R cerv paraspinals, scalenes, UT, LS, pects. TTP supraspinatus tendon and long head of biceps tendon, R.   TODAY'S TREATMENT:  DATE:  09/04/22 Attempted UBE, but too painful at this point [place] to R shoulder x 8 minutes STM and stretch to R up traps, pects, scalenes. Joint mobs, R humeral inf and post glide, grade lll, 2 x 10 reps PROM R shoulder flex, abd, IR/ER Seated posterior capsule stretch, seated humeral inf glide with hand on mat at 60 degrees Attempted doorway stretch and rotation while holding the frame to stretch pects, but caused too much pain Wall slides into flex and scaption, staying below 90 degrees of  elevation to manage pain, 10 Standing shoulder ext, rows, ER against G tband for scapular stability. CP to R shoulder x 10 minutes.  08/28/22 Education  PATIENT EDUCATION: Education details: POC Person educated: Patient Education method: Customer service manager Education comprehension: verbalized understanding  HOME EXERCISE PROGRAM: [id number]  ASSESSMENT:  CLINICAL IMPRESSION: Patient reports pain is somewhat better, but she was still restricted with some of her exercises due to pain. Initiated HEP with self mobs, stretch, and strengthening. She tolerated all activities with some pain, encouraged to keep her pain < 4-5/10 while exercising.  OBJECTIVE IMPAIRMENTS: decreased activity tolerance, decreased coordination, decreased ROM, decreased strength, impaired flexibility, impaired UE functional use, improper body mechanics, postural dysfunction, and pain.   ACTIVITY LIMITATIONS: carrying, lifting, sleeping, reach over head, and hygiene/grooming  PARTICIPATION LIMITATIONS: cleaning, laundry, community activity, and yard work  PERSONAL FACTORS: Past/current experiences are also affecting patient's functional outcome.   REHAB POTENTIAL: Good  CLINICAL DECISION MAKING: Stable/uncomplicated  EVALUATION COMPLEXITY: low   GOALS: Goals reviewed with patient? Yes  SHORT TERM GOALS: Target date: 09/09/22  I with initial HEP Baseline: Goal status:09/04/22-Initial program provided.  LONG TERM GOALS: Target date: 11/20/22  I with final HEP Baseline:  Goal status: INITIAL  2.  Patient will perform AROM of R shoulder at least 120 elevation, with<3/10 pain. Baseline: 102, with increased pain Goal status: INITIAL  3.  Increase R shoulder strength to at least 4-/5 Baseline: Unable to tolerate resistance due to pain Goal status: INITIAL  4.  Increase FOTO score to at least 61 Baseline: 48 Goal status: INITIAL  5.  Patient will be able to manage styling her hair and  putting on her bra with R shoulder pain < 3/10 Baseline: 8/10 Goal status: INITIAL  PLAN:  PT FREQUENCY: 1-2x/week  PT DURATION: 12 weeks  PLANNED INTERVENTIONS: Therapeutic exercises, Therapeutic activity, Neuromuscular re-education, Balance training, Gait training, Patient/Family education, Self Care, Joint mobilization, Dry Needling, Electrical stimulation, Cryotherapy, Moist heat, Taping, Ionotophoresis '4mg'$ /ml Dexamethasone, and Manual therapy  PLAN FOR NEXT SESSION: HEP, STM, acute pain and inflammation treatment. **09/13/22- Print out treatment prior to Dr appointment on 09/16/22**   Marcelina Morel, DPT 09/04/2022, 2:01 PM

## 2022-09-05 ENCOUNTER — Ambulatory Visit (HOSPITAL_BASED_OUTPATIENT_CLINIC_OR_DEPARTMENT_OTHER): Payer: Medicare PPO | Admitting: Pulmonary Disease

## 2022-09-05 ENCOUNTER — Ambulatory Visit (INDEPENDENT_AMBULATORY_CARE_PROVIDER_SITE_OTHER): Payer: Medicare PPO | Admitting: Adult Health

## 2022-09-05 ENCOUNTER — Encounter: Payer: Self-pay | Admitting: Pulmonary Disease

## 2022-09-05 ENCOUNTER — Encounter (HOSPITAL_BASED_OUTPATIENT_CLINIC_OR_DEPARTMENT_OTHER): Payer: Self-pay | Admitting: Pulmonary Disease

## 2022-09-05 ENCOUNTER — Encounter (INDEPENDENT_AMBULATORY_CARE_PROVIDER_SITE_OTHER): Payer: Self-pay | Admitting: Adult Health

## 2022-09-05 VITALS — BP 128/68 | HR 78 | Temp 98.2°F | Ht 66.0 in | Wt 197.4 lb

## 2022-09-05 VITALS — BP 142/69 | HR 56 | Temp 98.5°F | Ht 66.0 in | Wt 194.0 lb

## 2022-09-05 DIAGNOSIS — E669 Obesity, unspecified: Secondary | ICD-10-CM

## 2022-09-05 DIAGNOSIS — E871 Hypo-osmolality and hyponatremia: Secondary | ICD-10-CM

## 2022-09-05 DIAGNOSIS — Z7984 Long term (current) use of oral hypoglycemic drugs: Secondary | ICD-10-CM

## 2022-09-05 DIAGNOSIS — G4733 Obstructive sleep apnea (adult) (pediatric): Secondary | ICD-10-CM

## 2022-09-05 DIAGNOSIS — Z6831 Body mass index (BMI) 31.0-31.9, adult: Secondary | ICD-10-CM | POA: Diagnosis not present

## 2022-09-05 DIAGNOSIS — M7541 Impingement syndrome of right shoulder: Secondary | ICD-10-CM | POA: Diagnosis not present

## 2022-09-05 DIAGNOSIS — E1169 Type 2 diabetes mellitus with other specified complication: Secondary | ICD-10-CM | POA: Diagnosis not present

## 2022-09-05 NOTE — Assessment & Plan Note (Signed)
Hypoglossal nerve stimulator was reassessed today.  Goal of the follow-up visit was to ensure good compliance, good subjective benefit, good tongue motion and good sense lead waveforms .incision sites appear good, tongue protrusion was examined.  Download was reviewed and usage appears to be up to 9-10 hours average per night.  Spouse reports that snoring is decreased and she has more energy.  She denies any discomfort.  She is at level 6 = 1.8V Programming :   Sensing waveform was analyzed for 3 minutes Sleep remote education was provided patient demonstrated competency with the remote and was aware of patient Instruction videos and sleep remote guide Patient was instructed to stay at same level 0.8 V.  Increasing level to 1.9 cause discomfort Inspire titration sleep study has been scheduled and follow-up office visit has been scheduled. Download was reviewed to confirm good compliance with the device

## 2022-09-05 NOTE — Patient Instructions (Signed)
Continue at current level until sleep study

## 2022-09-05 NOTE — Progress Notes (Signed)
WEIGHT SUMMARY AND BIOMETRICS  Vitals Temp: 98.2 F (36.8 C) BP: 128/68 Pulse Rate: 78 SpO2: 98 % (RA)   Anthropometric Measurements Height: '5\' 6"'$  (1.676 m) Weight: 197 lb 6.4 oz (89.5 kg) BMI (Calculated): 31.88 Weight at Last Visit: 193lb Weight Lost Since Last Visit: 0 Weight Gained Since Last Visit: 1lb Starting Weight: 220lb Total Weight Loss (lbs): 26 lb (11.8 kg)   Body Composition  Body Fat %: 45.7 % Fat Mass (lbs): 88.8 lbs Muscle Mass (lbs): 100 lbs Total Body Water (lbs): 71.6 lbs Visceral Fat Rating : 13   Other Clinical Data Fasting: no Labs: no Today's Visit #: 63 Starting Date: 01/19/18    Chief Complaint:   OBESITY [name] is here to discuss her progress with her obesity treatment plan. She is on the the Category 2 Plan and states she is following her eating plan approximately 65 % of the time.  She states she is exercising out pt and PT and home PT exercises 60/15 minutes 2/5 times per week.   Interim History:  [name] was recently evaluated by Dr. [facility] for chronic R shoulder pain. She has started out patient PT- 2 x week and then performing home upper body strengthening 5 x week.  She brought in her Inspire remote and demonstrated how the device works.  She reports dramatically improved quality and quantity of sleep since Inspire device was implanted on 04/23/2022 Surgical Procedure:    1.  12th cranial nerve (hypoglossal) stimulation implant                                     2.  Placement of chest wall respiratory sensor                                     3.  Electronic analysis of implanted neurostimulator with pulse generator system  Subjective:   1. Type 2 diabetes mellitus with obesity (HCC)  Latest Reference Range & Units 03/05/22 16:11  Hemoglobin A1C 4.8 - 5.6 % 6.3 (H)  Est. average glucose Bld gHb Est-mCnc mg/dL 134  INSULIN 2.6 - 24.9 uIU/mL 6.3  (H): Data is abnormally high  Her  PCP/Dr. Yong Channel will run labs next moth at [facility]. PCP manages Metformin therapy- per pt she is taking one '500mg'$  tablet QD. She is not taking 1/2 tab at dinner.  2. Hyponatremia Chronic hyponatremia- she denies change in mentation. She limits plain water intake to 40 oz/day.  Latest Reference Range & Units 02/14/22 13:00 04/03/22 10:00 04/18/22 13:00  Sodium 135 - 145 mmol/L 135 132 (L) 134 (L)  (L): Data is abnormally low  3. Shoulder impingement syndrome, right SUBJECTIVE STATEMENT: Patient reports that several weeks ago her R arm started hurting, just distal to shoulder. No fall or known MOI. She can reach up overhead, but it hurts when she is holding something-glass, holding hair dryer or curling iron and reaching back to back of head. She had an injection 08/16/22, but has no relief   PERTINENT HISTORY: L TSR 2022, R TKR 2013, heart valve replacement, just returned to Dr with good report.   PAIN:  Are you having pain? Yes: NPRS scale: 6/10 Pain location: R shoulder and proximal arm Pain description: sharp Aggravating factors: raising her arm with resistance- object, using arm to rotate  her bra around. Relieving factors: Not using her arm, don't sleep on R.  ASSESSMENT:   CLINICAL IMPRESSION: Patient is a 72 y.o. who was seen today for physical therapy evaluation and treatment for R shoulder impingement. She is very TTP, demonstrates Positive tests for shoulder impingement and tendonitis in supraspinatus and biceps tendons. She shows decreased joint mobility and weakness in all upper R quadrant muscles. She will benefit from PT to address her pain, weakness, incorrect mechanics in the shoulder in order to restore a more normal movement pattern and give relief to her impingement with LTG of decreased pain during her normal daily activities.  Assessment/Plan:   1. Type 2 diabetes mellitus with obesity ([place]) Update MAR to reflect daily   2. Hyponatremia Continue with water restriction  and monitor for change in mentation.  3. Shoulder impingement syndrome, right Continue with PT- in office and at home. F/u with Dr. Mardelle Matte as directed.  4. Obesity, current BMI 31.33  [name] is currently in the action stage of change. As such, her goal is to continue with weight loss efforts. She has agreed to the Category 2 Plan.   Exercise goals: Older adults should follow the adult guidelines. When older adults cannot meet the adult guidelines, they should be as physically active as their abilities and conditions will allow.  Older adults should do exercises that maintain or improve balance if they are at risk of falling.  Older adults with chronic conditions should understand whether and how their conditions affect their ability to do regular physical activity safely.  Behavioral modification strategies: increasing lean protein intake, decreasing simple carbohydrates, increasing vegetables, increasing water intake, no skipping meals, meal planning and cooking strategies, and planning for success.  [name] has agreed to follow-up with our clinic in 4 weeks. She was informed of the importance of frequent follow-up visits to maximize her success with intensive lifestyle modifications for her multiple health conditions.   Objective:   Blood pressure (!) 142/69, pulse (!) 56, temperature 98.5 F (36.9 C), height '5\' 6"'$  (1.676 m), weight 194 lb (88 kg), SpO2 99 %. Body mass index is 31.31 kg/m.  General: Cooperative, alert, well developed, in no acute distress. HEENT: Conjunctivae and lids unremarkable. Cardiovascular: Regular rhythm.  Lungs: Normal work of breathing. Neurologic: No focal deficits.   Lab Results  Component Value Date   CREATININE 0.53 04/18/2022   BUN 19 04/18/2022   NA 134 (L) 04/18/2022   K 4.4 04/18/2022   CL 101 04/18/2022   CO2 22 04/18/2022   Lab Results  Component Value Date   ALT 13 04/03/2022   AST 24 04/03/2022   ALKPHOS 118 (H) 04/03/2022    BILITOT 0.5 04/03/2022   Lab Results  Component Value Date   HGBA1C 6.3 (H) 03/05/2022   HGBA1C 6.3 09/24/2021   HGBA1C 6.6 (H) 06/05/2021   HGBA1C 6.5 (H) 02/09/2021   HGBA1C 6.3 (H) 01/25/2021   Lab Results  Component Value Date   INSULIN 6.3 03/05/2022   INSULIN 12.9 06/05/2021   INSULIN 7.7 01/25/2021   INSULIN 13.1 04/20/2020   INSULIN 12.1 01/19/2018   Lab Results  Component Value Date   TSH 4.23 09/17/2021   Lab Results  Component Value Date   CHOL 144 06/05/2021   HDL 64 06/05/2021   LDLCALC 65 06/05/2021   LDLDIRECT 67.0 04/03/2022   TRIG 75 06/05/2021   CHOLHDL 2.3 06/05/2021   Lab Results  Component Value Date   VD25OH 62.2 03/05/2022   VD25OH  70.1 06/05/2021   VD25OH 74.5 01/25/2021   Lab Results  Component Value Date   WBC 9.9 06/12/2022   HGB 11.4 (L) 06/12/2022   HCT 35.1 (L) 06/12/2022   MCV 82.1 06/12/2022   PLT 387.0 06/12/2022   Lab Results  Component Value Date   IRON 48 06/12/2022   TIBC 450.8 (H) 06/12/2022   FERRITIN 12.0 06/12/2022   Attestation Statements:   Reviewed by clinician on day of visit: allergies, medications, problem list, medical history, surgical history, family history, social history, and previous encounter notes.  Time spent on visit including pre-visit chart review and post-visit care and charting was 32 minutes.   I have reviewed the above documentation for accuracy and completeness, and I agree with the above. -  Charissa Knowles d. Patches Mcdonnell, NP-C

## 2022-09-05 NOTE — Progress Notes (Signed)
   Subjective:    Patient ID: [name], female    DOB: [birth year], 72 y.o.   MRN: [id number]  HPI  72 year old for follow-up of OSA   PMH -  hypertension, diabetes, stroke, diastolic dysfunction and aortic valve replacement, SIADH on demeclocycline   She was unable to tolerate CPAP and underwent implantation of hypoglossal nerve stimulator by Dr. Wilburn Cornelia on 10/31.  07/02/22 Device activated Stimulation level : Sensation 1.2 V , functional level 1.3 V lower limit, upper limit 2.3 V Start delay 30 minutes, pause time 15 minutes, duration 12 hours  Chief Complaint  Patient presents with   Follow-up    Pt states she has been doing okay since last visit and denies any real complaints.   Since her last visit a month ago, she has gone up to more levels to 1.8 V, level 6.  She is tolerating this well and able to use therapy all night and every night she does not use the pause button much. She is resting well and feels rested when waking up. Husband accompanies and has not noted any snoring   Significant tests/ events reviewed   Home sleep study July 2017 AHI 19/hr    HST 09/2021 showed moderate sleep apnea with AHI at 16.5/hour and SPO2 low at 77%  Review of Systems neg for any significant sore throat, dysphagia, itching, sneezing, nasal congestion or excess/ purulent secretions, fever, chills, sweats, unintended wt loss, pleuritic or exertional cp, hempoptysis, orthopnea pnd or change in chronic leg swelling. Also denies presyncope, palpitations, heartburn, abdominal pain, nausea, vomiting, diarrhea or change in bowel or urinary habits, dysuria,hematuria, rash, arthralgias, visual complaints, headache, numbness weakness or ataxia.     Objective:   Physical Exam  Gen. Pleasant, obese, in no distress ENT - no lesions, no post nasal drip, good tongue protrusion Neck: No JVD, no thyromegaly, no carotid bruits Lungs: no use of accessory muscles, no dullness to percussion,  decreased without rales or rhonchi  Cardiovascular: Rhythm regular, heart sounds  normal, no murmurs or gallops, no peripheral edema Musculoskeletal: No deformities, no cyanosis or clubbing , no tremors       Assessment & Plan:

## 2022-09-06 ENCOUNTER — Encounter: Payer: Self-pay | Admitting: Physical Therapy

## 2022-09-06 ENCOUNTER — Ambulatory Visit: Payer: Medicare PPO | Admitting: Physical Therapy

## 2022-09-06 DIAGNOSIS — R600 Localized edema: Secondary | ICD-10-CM | POA: Diagnosis not present

## 2022-09-06 DIAGNOSIS — M25811 Other specified joint disorders, right shoulder: Secondary | ICD-10-CM | POA: Diagnosis not present

## 2022-09-06 DIAGNOSIS — M25612 Stiffness of left shoulder, not elsewhere classified: Secondary | ICD-10-CM | POA: Diagnosis not present

## 2022-09-06 DIAGNOSIS — M25512 Pain in left shoulder: Secondary | ICD-10-CM | POA: Diagnosis not present

## 2022-09-06 DIAGNOSIS — M5442 Lumbago with sciatica, left side: Secondary | ICD-10-CM | POA: Diagnosis not present

## 2022-09-06 DIAGNOSIS — M6281 Muscle weakness (generalized): Secondary | ICD-10-CM

## 2022-09-06 DIAGNOSIS — Z96612 Presence of left artificial shoulder joint: Secondary | ICD-10-CM | POA: Diagnosis not present

## 2022-09-06 DIAGNOSIS — M25611 Stiffness of right shoulder, not elsewhere classified: Secondary | ICD-10-CM

## 2022-09-06 DIAGNOSIS — M5441 Lumbago with sciatica, right side: Secondary | ICD-10-CM | POA: Diagnosis not present

## 2022-09-06 NOTE — Therapy (Addendum)
OUTPATIENT PHYSICAL THERAPY SHOULDER EVALUATION   Patient Name: [name] MRN: [id number] DOB:07-15-1950, 72 y.o., female Today's Date: 09/06/2022  END OF SESSION:  PT End of Session - 09/06/22 1005     Visit Number 3    Date for PT Re-Evaluation 11/20/22    PT Start Time 1005    PT Stop Time 1050    PT Time Calculation (min) 45 min    Activity Tolerance Patient limited by pain    Behavior During Therapy WFL for tasks assessed/performed             Past Medical History:  Diagnosis Date   Allergy    Anxiety    Arthritis    back & knee   Asthma     mild per pt shows up with resp illness   Colon polyps    Constipation    Diabetes mellitus without complication (HCC)    Gallstones    Gastric polyps    Gastroparesis    GERD (gastroesophageal reflux disease)    Headache    sinus headaches and migraines at times   Heart murmur    History of migraine headaches    HTN (hypertension)    Hyperlipidemia    IBS (irritable bowel syndrome)    Joint pain    Lumbar disc disease    PONV (postoperative nausea and vomiting)    S/P aortic valve replacement with bioprosthetic valve 08/23/2016   25 mm Edwards Intuity rapid-deployment bovine pericardial tissue valve via partial upper mini sternotomy   Sleep apnea    does not use CPAP   TIA (transient ischemic attack)    TIA (transient ischemic attack)    hx of per pt   Past Surgical History:  Procedure Laterality Date   ABDOMINAL HYSTERECTOMY     AORTIC VALVE REPLACEMENT N/A 08/23/2016   Procedure: AORTIC VALVE REPLACEMENT (AVR) - using partial Upper Sternotomy- 86mm Edwards Intuity Aortic Valve used;  Surgeon: Rexene Alberts, MD;  Location: [facility];  Service: Open Heart Surgery;  Laterality: N/A;   BLADDER SUSPENSION  2007   BREAST ENHANCEMENT SURGERY  1980   CARPAL TUNNEL RELEASE Bilateral    COLONOSCOPY     DORSAL COMPARTMENT RELEASE Right 09/15/2014   Procedure: RIGHT WRIST DEQUERVAINS RELEASE ;  Surgeon: Kathryne Hitch, MD;  Location: [place];  Service: Orthopedics;  Laterality: Right;   DRUG INDUCED ENDOSCOPY N/A 02/19/2022   Procedure: DRUG INDUCED SLEEP ENDOSCOPY;  Surgeon: Jerrell Belfast, MD;  Location: [facility];  Service: ENT;  Laterality: N/A;   ESOPHAGOGASTRODUODENOSCOPY     EYE SURGERY     cataract surgery   IMPLANTATION OF HYPOGLOSSAL NERVE STIMULATOR Right 04/23/2022   Procedure: IMPLANTATION OF HYPOGLOSSAL NERVE STIMULATOR;  Surgeon: Jerrell Belfast, MD;  Location: [facility];  Service: ENT;  Laterality: Right;   KNEE ARTHROSCOPY  04/15/2012   Procedure: ARTHROSCOPY KNEE;  Surgeon: [name], MD;  Location: [facility];  Service: Orthopedics;  Laterality: Right;   LAPAROSCOPIC CHOLECYSTECTOMY     LASIK     OOPHORECTOMY     PALATE TO GINGIVA GRAFT  2017   REVERSE SHOULDER ARTHROPLASTY Left 02/13/2021   Procedure: REVERSE SHOULDER ARTHROPLASTY;  Surgeon: Marchia Bond, MD;  Location: [facility];  Service: Orthopedics;  Laterality: Left;   RIGHT/LEFT HEART CATH AND CORONARY ANGIOGRAPHY N/A 08/02/2016   Procedure: Right/Left Heart Cath and Coronary Angiography;  Surgeon: Sherren Mocha, MD;  Location: [facility];  Service:  Cardiovascular;  Laterality: N/A;   ROTATOR CUFF REPAIR Left    TEE WITHOUT CARDIOVERSION N/A 08/23/2016   Procedure: TRANSESOPHAGEAL ECHOCARDIOGRAM (TEE);  Surgeon: Rexene Alberts, MD;  Location: [place] [facility];  Service: Open Heart Surgery;  Laterality: N/A;   TOTAL KNEE ARTHROPLASTY  04/15/2012   Procedure: TOTAL KNEE ARTHROPLASTY;  Surgeon: [name], MD;  Location: [place];  Service: Orthopedics;  Laterality: Right;   TRIGGER FINGER RELEASE Right 04/20/2015   Procedure: RIGHT TRIGGER FINGER RELEASE (TENDON SHEALTH INCISION) ;  Surgeon: [name], MD;  Location: [place];  Service: Orthopedics;  Laterality: Right;   Patient Active Problem List   Diagnosis Date Noted   Class 2 severe obesity with  serious comorbidity and body mass index (BMI) of 36.0 to 36.9 in adult ([place]) 05/09/2022   Type 2 diabetes mellitus with obesity ([place]) 05/09/2022   Hypertension associated with type 2 diabetes mellitus ([place]) 05/09/2022   SOB (shortness of breath) 12/10/2021   S/P reverse total shoulder arthroplasty, left 02/13/2021   Idiopathic hyperphosphatasia 08/03/2019   Vitamin D deficiency 06/04/2018   Hyponatremia 02/17/2018   History of CVA in adulthood 08/13/2017   Blurry vision 08/13/2017   Primary hypertension 01/02/2017   S/P minimally invasive aortic valve replacement with bioprosthetic valve [id number]   Diastolic dysfunction    Osteopenia 05/27/2016   OSA (obstructive sleep apnea) 02/27/2016   Bruxism 10/23/2015   Elevated alkaline phosphatase level 05/16/2015   Atypical chest pain 11/10/2014   Acne 05/12/2014   Diabetes mellitus ([place]) 05/12/2014   GERD (gastroesophageal reflux disease) 03/10/2014   Generalized anxiety disorder 03/10/2014   Migraines 03/10/2014   EUSTACHIAN TUBE DYSFUNCTION, CHRONIC 06/14/2010   Irritable bowel syndrome 12/15/2008   Low back pain 08/11/2008   Hyperlipidemia associated with type 2 diabetes mellitus ([place]) 02/09/2008   Allergic rhinitis 02/12/2007   Asthma 02/12/2007    PCP: Garret Reddish O,MD  REFERRING PROVIDER: Marchia Bond, MD  REFERRING DIAG: Rt Shoulder impingement   THERAPY DIAG:  Stiffness of right shoulder, not elsewhere classified  Muscle weakness (generalized)  Impingement of right shoulder  Rationale for Evaluation and Treatment: Rehabilitation  ONSET DATE: 08/19/22  SUBJECTIVE:                                                                                                                                                                                      SUBJECTIVE STATEMENT: Patient reports she did get some relief after last treatment, but the pain did return the next day.  PERTINENT HISTORY: L TSR 2022, R TKR 2013,  heart valve replacement, just returned to Dr with good report.  PAIN:  Are  you having pain? Yes: NPRS scale: 6/10 Pain location: R shoulder and proximal arm Pain description: sharp Aggravating factors: raising her arm with resistance- object, using arm to rotate her bra around. Relieving factors: Not using her arm, don't sleep on R.  PRECAUTIONS: None  WEIGHT BEARING RESTRICTIONS: No  FALLS:  Has patient fallen in last 6 months? No  LIVING ENVIRONMENT: Lives with: lives with their spouse Lives in: House/apartment Stairs: Yes: External: 5 steps; on left going up Has following equipment at home: None  OCCUPATION: Retired. Trying to lose weight, so wants/needs to exercise more, but the shoulder pain limits her ability to exercise.  PLOF: Independent  PATIENT GOALS:Decrease pain. Return to using her R arm wihtout pain.  NEXT MD VISIT: 09/16/22  OBJECTIVE:   DIAGNOSTIC FINDINGS:  X ray 2/27/24showed preservation of the bony architecure of the R shoulder   SURVEYS:  FOTO 48  COGNITION: Overall cognitive status: Within functional limits for tasks assessed     SENSATION: WFL  POSTURE: Slightly forward head, R shoulder rounded forward and elevated, flattened spinal curves, mildly winging scapulae  UPPER EXTREMITY ROM: L WFL, Active elevation mildly limited. Elbow and wrist WFL B.  Active ROM Right eval Left eval  Shoulder flexion 102   Shoulder extension 102   Shoulder abduction    Shoulder adduction    Shoulder internal rotation WNL   Shoulder external rotation 32   Elbow flexion    Elbow extension    Wrist flexion    Wrist extension    Wrist ulnar deviation    Wrist radial deviation    Wrist pronation    Wrist supination    (Blank rows = not tested)  UPPER EXTREMITY MMT:  MMT Right eval Left eval  Shoulder flexion    Shoulder extension    Shoulder abduction    Shoulder adduction    Shoulder internal rotation    Shoulder external rotation     Middle trapezius    Lower trapezius    Elbow flexion    Elbow extension    Wrist flexion    Wrist extension    Wrist ulnar deviation    Wrist radial deviation    Wrist pronation    Wrist supination    Grip strength (lbs)    (Blank rows = not tested)  SHOULDER SPECIAL TESTS: Impingement tests: Hawkins/Kennedy impingement test: positive  and Painful arc test: positive  Rotator cuff assessment: Drop arm test: positive , Empty can test: positive , Full can test: positive , and Gerber lift off test: negative Biceps assessment: Speed's test: positive   JOINT MOBILITY TESTING:  R humeral glide limited, most in post glide, also inferior glide.  PALPATION:  TTP R cerv paraspinals, up traps, medial scapular border. Tightness noted in R cerv paraspinals, scalenes, UT, LS, pects. TTP supraspinatus tendon and long head of biceps tendon, R.   TODAY'S TREATMENT:  DATE:  09/06/22 NuStep L4 x 6 minutes STM to cervical muscles and up traps, rhomboids on R, pects. Joint mobs, humeral depression and post glide, grade lll, 2 x 10 reps each. PROM for R shoulder flex abd, ER AAROM with 2# dowel, shoulder flex, abd, ext 10 reps each. Wall stars with yellow Tband, 2 x 5 reps each side Attempted scap protraction against G Tband, but patient had difficulty activating the motion. Ball on wall pushes in diamond pattern 10 rounds. Reported some lateral/distal shoulder soreness after this activity. Pendulum exercises with 3# weight, deep breathing to increase stretch CP to R shoulder x 10 minutes for inflammation.    09/04/22 Attempted UBE, but too painful at this point [place] to R shoulder x 8 minutes STM and stretch to R up traps, pects, scalenes. Joint mobs, R humeral inf and post glide, grade lll, 2 x 10 reps PROM R shoulder flex, abd, IR/ER Seated posterior capsule  stretch, seated humeral inf glide with hand on mat at 60 degrees Attempted doorway stretch and rotation while holding the frame to stretch pects, but caused too much pain Wall slides into flex and scaption, staying below 90 degrees of elevation to manage pain, 10 Standing shoulder ext, rows, ER against G tband for scapular stability. CP to R shoulder x 10 minutes.  08/28/22 Education  PATIENT EDUCATION: Education details: POC Person educated: Patient Education method: Customer service manager Education comprehension: verbalized understanding  HOME EXERCISE PROGRAM: [id number]  ASSESSMENT:  CLINICAL IMPRESSION: Patient reports some relief after last treatment, but the pain did return the next day. Performing HEP. Tolerated progression of stretching and strengthening activities today, finished with CP for pain relief.  OBJECTIVE IMPAIRMENTS: decreased activity tolerance, decreased coordination, decreased ROM, decreased strength, impaired flexibility, impaired UE functional use, improper body mechanics, postural dysfunction, and pain.   ACTIVITY LIMITATIONS: carrying, lifting, sleeping, reach over head, and hygiene/grooming  PARTICIPATION LIMITATIONS: cleaning, laundry, community activity, and yard work  PERSONAL FACTORS: Past/current experiences are also affecting patient's functional outcome.   REHAB POTENTIAL: Good  CLINICAL DECISION MAKING: Stable/uncomplicated  EVALUATION COMPLEXITY: low   GOALS: Goals reviewed with patient? Yes  SHORT TERM GOALS: Target date: 09/09/22  I with initial HEP Baseline: Goal status:09/04/22-Initial program provided.  LONG TERM GOALS: Target date: 11/20/22  I with final HEP Baseline:  Goal status: INITIAL  2.  Patient will perform AROM of R shoulder at least 120 elevation, with<3/10 pain. Baseline: 102, with increased pain Goal status: PROM WNL, but still having pain with active elevation 09/06/22-ongoing  3.  Increase R shoulder  strength to at least 4-/5 Baseline: Unable to tolerate resistance due to pain Goal status: 09/06/22-ongoing  4.  Increase FOTO score to at least 61 Baseline: 48 Goal status: INITIAL  5.  Patient will be able to manage styling her hair and putting on her bra with R shoulder pain < 3/10 Baseline: 8/10 Goal status: INITIAL  PLAN:  PT FREQUENCY: 1-2x/week  PT DURATION: 12 weeks  PLANNED INTERVENTIONS: Therapeutic exercises, Therapeutic activity, Neuromuscular re-education, Balance training, Gait training, Patient/Family education, Self Care, Joint mobilization, Dry Needling, Electrical stimulation, Cryotherapy, Moist heat, Taping, Ionotophoresis 4mg /ml Dexamethasone, and Manual therapy  PLAN FOR NEXT SESSION: HEP, STM, acute pain and inflammation treatment. **09/13/22- Print out treatment prior to Dr appointment on 09/16/22. Also, try DN**   Marcelina Morel, DPT 09/06/2022, 10:52 AM

## 2022-09-11 ENCOUNTER — Encounter: Payer: Self-pay | Admitting: Physical Therapy

## 2022-09-11 ENCOUNTER — Ambulatory Visit: Payer: Medicare PPO | Admitting: Physical Therapy

## 2022-09-11 DIAGNOSIS — M25811 Other specified joint disorders, right shoulder: Secondary | ICD-10-CM

## 2022-09-11 DIAGNOSIS — M5442 Lumbago with sciatica, left side: Secondary | ICD-10-CM | POA: Diagnosis not present

## 2022-09-11 DIAGNOSIS — M25611 Stiffness of right shoulder, not elsewhere classified: Secondary | ICD-10-CM | POA: Diagnosis not present

## 2022-09-11 DIAGNOSIS — M6281 Muscle weakness (generalized): Secondary | ICD-10-CM

## 2022-09-11 DIAGNOSIS — M5441 Lumbago with sciatica, right side: Secondary | ICD-10-CM | POA: Diagnosis not present

## 2022-09-11 DIAGNOSIS — R600 Localized edema: Secondary | ICD-10-CM | POA: Diagnosis not present

## 2022-09-11 DIAGNOSIS — M25512 Pain in left shoulder: Secondary | ICD-10-CM | POA: Diagnosis not present

## 2022-09-11 DIAGNOSIS — Z96612 Presence of left artificial shoulder joint: Secondary | ICD-10-CM | POA: Diagnosis not present

## 2022-09-11 DIAGNOSIS — M25612 Stiffness of left shoulder, not elsewhere classified: Secondary | ICD-10-CM | POA: Diagnosis not present

## 2022-09-11 NOTE — Therapy (Signed)
OUTPATIENT PHYSICAL THERAPY SHOULDER TREATMENT   Patient Name: [name] MRN: [id number] DOB:[birth year], 72 y.o., female Today's Date: 09/11/2022  END OF SESSION:  PT End of Session - 09/11/22 1304     Visit Number 4    Date for PT Re-Evaluation 11/20/22    PT Start Time 1300    PT Stop Time 1355    PT Time Calculation (min) 55 min    Activity Tolerance Patient limited by pain    Behavior During Therapy [facility] for tasks assessed/performed             Past Medical History:  Diagnosis Date   Allergy    Anxiety    Arthritis    back & knee   Asthma     mild per pt shows up with resp illness   Colon polyps    Constipation    Diabetes mellitus without complication (HCC)    Gallstones    Gastric polyps    Gastroparesis    GERD (gastroesophageal reflux disease)    Headache    sinus headaches and migraines at times   Heart murmur    History of migraine headaches    HTN (hypertension)    Hyperlipidemia    IBS (irritable bowel syndrome)    Joint pain    Lumbar disc disease    PONV (postoperative nausea and vomiting)    S/P aortic valve replacement with bioprosthetic valve 08/23/2016   25 mm Edwards Intuity rapid-deployment bovine pericardial tissue valve via partial upper mini sternotomy   Sleep apnea    does not use CPAP   TIA (transient ischemic attack)    TIA (transient ischemic attack)    hx of per pt   Past Surgical History:  Procedure Laterality Date   ABDOMINAL HYSTERECTOMY     AORTIC VALVE REPLACEMENT N/A 08/23/2016   Procedure: AORTIC VALVE REPLACEMENT (AVR) - using partial Upper Sternotomy- 55mm Edwards Intuity Aortic Valve used;  Surgeon: Rexene Alberts, MD;  Location: [place];  Service: Open Heart Surgery;  Laterality: N/A;   BLADDER SUSPENSION  2007   BREAST ENHANCEMENT SURGERY  1980   CARPAL TUNNEL RELEASE Bilateral    COLONOSCOPY     DORSAL COMPARTMENT RELEASE Right 09/15/2014   Procedure: RIGHT WRIST DEQUERVAINS RELEASE ;  Surgeon: Kathryne Hitch, MD;  Location: [place];  Service: Orthopedics;  Laterality: Right;   DRUG INDUCED ENDOSCOPY N/A 02/19/2022   Procedure: DRUG INDUCED SLEEP ENDOSCOPY;  Surgeon: Jerrell Belfast, MD;  Location: [place];  Service: ENT;  Laterality: N/A;   ESOPHAGOGASTRODUODENOSCOPY     EYE SURGERY     cataract surgery   IMPLANTATION OF HYPOGLOSSAL NERVE STIMULATOR Right 04/23/2022   Procedure: IMPLANTATION OF HYPOGLOSSAL NERVE STIMULATOR;  Surgeon: Jerrell Belfast, MD;  Location: [place];  Service: ENT;  Laterality: Right;   KNEE ARTHROSCOPY  04/15/2012   Procedure: ARTHROSCOPY KNEE;  Surgeon: [name], MD;  Location: [place];  Service: Orthopedics;  Laterality: Right;   LAPAROSCOPIC CHOLECYSTECTOMY     LASIK     OOPHORECTOMY     PALATE TO GINGIVA GRAFT  2017   REVERSE SHOULDER ARTHROPLASTY Left 02/13/2021   Procedure: REVERSE SHOULDER ARTHROPLASTY;  Surgeon: Marchia Bond, MD;  Location: [facility];  Service: Orthopedics;  Laterality: Left;   RIGHT/LEFT HEART CATH AND CORONARY ANGIOGRAPHY N/A 08/02/2016   Procedure: Right/Left Heart Cath and Coronary Angiography;  Surgeon: Sherren Mocha, MD;  Location: [place] [facility];  Service:  Cardiovascular;  Laterality: N/A;   ROTATOR CUFF REPAIR Left    TEE WITHOUT CARDIOVERSION N/A 08/23/2016   Procedure: TRANSESOPHAGEAL ECHOCARDIOGRAM (TEE);  Surgeon: Rexene Alberts, MD;  Location: [place];  Service: Open Heart Surgery;  Laterality: N/A;   TOTAL KNEE ARTHROPLASTY  04/15/2012   Procedure: TOTAL KNEE ARTHROPLASTY;  Surgeon: [name], MD;  Location: [place];  Service: Orthopedics;  Laterality: Right;   TRIGGER FINGER RELEASE Right 04/20/2015   Procedure: RIGHT TRIGGER FINGER RELEASE (TENDON SHEALTH INCISION) ;  Surgeon: [name], MD;  Location: [place];  Service: Orthopedics;  Laterality: Right;   Patient Active Problem List   Diagnosis Date Noted   Class 2 severe obesity with  serious comorbidity and body mass index (BMI) of 36.0 to 36.9 in adult [facility]) 05/09/2022   Type 2 diabetes mellitus with obesity ([place]) 05/09/2022   Hypertension associated with type 2 diabetes mellitus ([place]) 05/09/2022   SOB (shortness of breath) 12/10/2021   S/P reverse total shoulder arthroplasty, left 02/13/2021   Idiopathic hyperphosphatasia 08/03/2019   Vitamin D deficiency 06/04/2018   Hyponatremia 02/17/2018   History of CVA in adulthood 08/13/2017   Blurry vision 08/13/2017   Primary hypertension 01/02/2017   S/P minimally invasive aortic valve replacement with bioprosthetic valve [id number]   Diastolic dysfunction    Osteopenia 05/27/2016   OSA (obstructive sleep apnea) 02/27/2016   Bruxism 10/23/2015   Elevated alkaline phosphatase level 05/16/2015   Atypical chest pain 11/10/2014   Acne 05/12/2014   Diabetes mellitus ([place]) 05/12/2014   GERD (gastroesophageal reflux disease) 03/10/2014   Generalized anxiety disorder 03/10/2014   Migraines 03/10/2014   EUSTACHIAN TUBE DYSFUNCTION, CHRONIC 06/14/2010   Irritable bowel syndrome 12/15/2008   Low back pain 08/11/2008   Hyperlipidemia associated with type 2 diabetes mellitus ([place]) 02/09/2008   Allergic rhinitis 02/12/2007   Asthma 02/12/2007    PCP: Garret Reddish O,MD  REFERRING PROVIDER: Marchia Bond, MD  REFERRING DIAG: Rt Shoulder impingement   THERAPY DIAG:  Stiffness of right shoulder, not elsewhere classified  Muscle weakness (generalized)  Impingement of right shoulder  Rationale for Evaluation and Treatment: Rehabilitation  ONSET DATE: 08/19/22  SUBJECTIVE:                                                                                                                                                                                      SUBJECTIVE STATEMENT: "I still hurt"   PERTINENT HISTORY: L TSR 2022, R TKR 2013, heart valve replacement, just returned to Dr with good report.  PAIN:  Are you  having pain? Yes: NPRS scale: 6/10 Pain location: R shoulder and proximal arm  Pain description: sharp Aggravating factors: raising her arm with resistance- object, using arm to rotate her bra around. Relieving factors: Not using her arm, don't sleep on R.  PRECAUTIONS: None  WEIGHT BEARING RESTRICTIONS: No  FALLS:  Has patient fallen in last 6 months? No  LIVING ENVIRONMENT: Lives with: lives with their spouse Lives in: House/apartment Stairs: Yes: External: 5 steps; on left going up Has following equipment at home: None  OCCUPATION: Retired. Trying to lose weight, so wants/needs to exercise more, but the shoulder pain limits her ability to exercise.  PLOF: Independent  PATIENT GOALS:Decrease pain. Return to using her R arm wihtout pain.  NEXT MD VISIT: 09/16/22  OBJECTIVE:   DIAGNOSTIC FINDINGS:  X ray 2/27/24showed preservation of the bony architecure of the R shoulder   SURVEYS:  FOTO 48  COGNITION: Overall cognitive status: Within functional limits for tasks assessed     SENSATION: WFL  POSTURE: Slightly forward head, R shoulder rounded forward and elevated, flattened spinal curves, mildly winging scapulae  UPPER EXTREMITY ROM: L WFL, Active elevation mildly limited. Elbow and wrist WFL B.  Active ROM Right eval Left eval  Shoulder flexion 102   Shoulder extension 102   Shoulder abduction    Shoulder adduction    Shoulder internal rotation WNL   Shoulder external rotation 32   Elbow flexion    Elbow extension    Wrist flexion    Wrist extension    Wrist ulnar deviation    Wrist radial deviation    Wrist pronation    Wrist supination    (Blank rows = not tested)  UPPER EXTREMITY MMT:  MMT Right eval Left eval  Shoulder flexion    Shoulder extension    Shoulder abduction    Shoulder adduction    Shoulder internal rotation    Shoulder external rotation    Middle trapezius    Lower trapezius    Elbow flexion    Elbow extension    Wrist  flexion    Wrist extension    Wrist ulnar deviation    Wrist radial deviation    Wrist pronation    Wrist supination    Grip strength (lbs)    (Blank rows = not tested)  SHOULDER SPECIAL TESTS: Impingement tests: Hawkins/Kennedy impingement test: positive  and Painful arc test: positive  Rotator cuff assessment: Drop arm test: positive , Empty can test: positive , Full can test: positive , and Gerber lift off test: negative Biceps assessment: Speed's test: positive   JOINT MOBILITY TESTING:  R humeral glide limited, most in post glide, also inferior glide.  PALPATION:  TTP R cerv paraspinals, up traps, medial scapular border. Tightness noted in R cerv paraspinals, scalenes, UT, LS, pects. TTP supraspinatus tendon and long head of biceps tendon, R.   TODAY'S TREATMENT:  DATE:  09/11/22 NuStep L 4 x 6 min AAROM Flex, Ext, IR up back  Seated Rows yellow 2x10 Shoulder Ext Yellow 2x10 RUE ER/IR yellow, very weak with ER Triceps Ext 15lb 2x10, biceps curls 5lb x10 Biceps curls 2lb dumbbells x10 Pendulum exercises with 3# weight, deep breathing to increase stretch STM to cervical muscles and up traps, rhomboids  PROM for R shoulder flex abd, ER CP to R shoulder x 10 minutes for inflammation.   09/06/22 NuStep L4 x 6 minutes STM to cervical muscles and up traps, rhomboids on R, pects. Joint mobs, humeral depression and post glide, grade lll, 2 x 10 reps each. PROM for R shoulder flex abd, ER AAROM with 2# dowel, shoulder flex, abd, ext 10 reps each. Wall stars with yellow Tband, 2 x 5 reps each side Attempted scap protraction against G Tband, but patient had difficulty activating the motion. Ball on wall pushes in diamond pattern 10 rounds. Reported some lateral/distal shoulder soreness after this activity. Pendulum exercises with 3# weight, deep  breathing to increase stretch CP to R shoulder x 10 minutes for inflammation.    09/04/22 Attempted UBE, but too painful at this point [place] to R shoulder x 8 minutes STM and stretch to R up traps, pects, scalenes. Joint mobs, R humeral inf and post glide, grade lll, 2 x 10 reps PROM R shoulder flex, abd, IR/ER Seated posterior capsule stretch, seated humeral inf glide with hand on mat at 60 degrees Attempted doorway stretch and rotation while holding the frame to stretch pects, but caused too much pain Wall slides into flex and scaption, staying below 90 degrees of elevation to manage pain, 10 Standing shoulder ext, rows, ER against G tband for scapular stability. CP to R shoulder x 10 minutes.  08/28/22 Education  PATIENT EDUCATION: Education details: POC Person educated: Patient Education method: Customer service manager Education comprehension: verbalized understanding  HOME EXERCISE PROGRAM: [id number]  ASSESSMENT:  CLINICAL IMPRESSION: Performing HEP. Tolerated progression of stretching and strengthening activities today. RUE ER was very weak. Some pain reported with the end range of seated rows. Constant cues needed to relax with PROM. Less pain reported when she didn't assist with PROM. finished with CP for pain relief.  OBJECTIVE IMPAIRMENTS: decreased activity tolerance, decreased coordination, decreased ROM, decreased strength, impaired flexibility, impaired UE functional use, improper body mechanics, postural dysfunction, and pain.   ACTIVITY LIMITATIONS: carrying, lifting, sleeping, reach over head, and hygiene/grooming  PARTICIPATION LIMITATIONS: cleaning, laundry, community activity, and yard work  PERSONAL FACTORS: Past/current experiences are also affecting patient's functional outcome.   REHAB POTENTIAL: Good  CLINICAL DECISION MAKING: Stable/uncomplicated  EVALUATION COMPLEXITY: low   GOALS: Goals reviewed with patient? Yes  SHORT TERM GOALS: Target  date: 09/09/22  I with initial HEP Baseline: Goal status:09/04/22-Initial program provided.  LONG TERM GOALS: Target date: 11/20/22  I with final HEP Baseline:  Goal status: INITIAL  2.  Patient will perform AROM of R shoulder at least 120 elevation, with<3/10 pain. Baseline: 102, with increased pain Goal status: PROM WNL, but still having pain with active elevation 09/06/22-ongoing  3.  Increase R shoulder strength to at least 4-/5 Baseline: Unable to tolerate resistance due to pain Goal status: 09/06/22-ongoing  4.  Increase FOTO score to at least 61 Baseline: 48 Goal status: INITIAL  5.  Patient will be able to manage styling her hair and putting on her bra with R shoulder pain < 3/10 Baseline: 8/10 Goal status: INITIAL  PLAN:  PT  FREQUENCY: 1-2x/week  PT DURATION: 12 weeks  PLANNED INTERVENTIONS: Therapeutic exercises, Therapeutic activity, Neuromuscular re-education, Balance training, Gait training, Patient/Family education, Self Care, Joint mobilization, Dry Needling, Electrical stimulation, Cryotherapy, Moist heat, Taping, Ionotophoresis 4mg /ml Dexamethasone, and Manual therapy  PLAN FOR NEXT SESSION: HEP, STM, acute pain and inflammation treatment. **09/13/22- Print out treatment prior to Dr appointment on 09/16/22. Also, try DN**   Marcelina Morel, DPT 09/11/2022, 1:43 PM

## 2022-09-13 ENCOUNTER — Encounter: Payer: Self-pay | Admitting: Physical Therapy

## 2022-09-13 ENCOUNTER — Ambulatory Visit: Payer: Medicare PPO | Admitting: Physical Therapy

## 2022-09-13 DIAGNOSIS — R600 Localized edema: Secondary | ICD-10-CM | POA: Diagnosis not present

## 2022-09-13 DIAGNOSIS — M5441 Lumbago with sciatica, right side: Secondary | ICD-10-CM | POA: Diagnosis not present

## 2022-09-13 DIAGNOSIS — M25611 Stiffness of right shoulder, not elsewhere classified: Secondary | ICD-10-CM

## 2022-09-13 DIAGNOSIS — Z96612 Presence of left artificial shoulder joint: Secondary | ICD-10-CM | POA: Diagnosis not present

## 2022-09-13 DIAGNOSIS — M25811 Other specified joint disorders, right shoulder: Secondary | ICD-10-CM

## 2022-09-13 DIAGNOSIS — M5442 Lumbago with sciatica, left side: Secondary | ICD-10-CM | POA: Diagnosis not present

## 2022-09-13 DIAGNOSIS — M25512 Pain in left shoulder: Secondary | ICD-10-CM | POA: Diagnosis not present

## 2022-09-13 DIAGNOSIS — M25612 Stiffness of left shoulder, not elsewhere classified: Secondary | ICD-10-CM | POA: Diagnosis not present

## 2022-09-13 DIAGNOSIS — M6281 Muscle weakness (generalized): Secondary | ICD-10-CM | POA: Diagnosis not present

## 2022-09-13 NOTE — Therapy (Signed)
OUTPATIENT PHYSICAL THERAPY SHOULDER TREATMENT   Patient Name: [name] MRN: [id number] DOB:1950-11-18, 72 y.o., female Today's Date: 09/13/2022  END OF SESSION:  PT End of Session - 09/13/22 1103     Visit Number 5    Date for PT Re-Evaluation 11/20/22    PT Start Time 75    PT Stop Time 1155    PT Time Calculation (min) 55 min    Activity Tolerance Patient limited by pain    Behavior During Therapy [facility] for tasks assessed/performed             Past Medical History:  Diagnosis Date   Allergy    Anxiety    Arthritis    back & knee   Asthma     mild per pt shows up with resp illness   Colon polyps    Constipation    Diabetes mellitus without complication (HCC)    Gallstones    Gastric polyps    Gastroparesis    GERD (gastroesophageal reflux disease)    Headache    sinus headaches and migraines at times   Heart murmur    History of migraine headaches    HTN (hypertension)    Hyperlipidemia    IBS (irritable bowel syndrome)    Joint pain    Lumbar disc disease    PONV (postoperative nausea and vomiting)    S/P aortic valve replacement with bioprosthetic valve 08/23/2016   25 mm Edwards Intuity rapid-deployment bovine pericardial tissue valve via partial upper mini sternotomy   Sleep apnea    does not use CPAP   TIA (transient ischemic attack)    TIA (transient ischemic attack)    hx of per pt   Past Surgical History:  Procedure Laterality Date   ABDOMINAL HYSTERECTOMY     AORTIC VALVE REPLACEMENT N/A 08/23/2016   Procedure: AORTIC VALVE REPLACEMENT (AVR) - using partial Upper Sternotomy- 74mm Edwards Intuity Aortic Valve used;  Surgeon: Rexene Alberts, MD;  Location: [place];  Service: Open Heart Surgery;  Laterality: N/A;   BLADDER SUSPENSION  2007   BREAST ENHANCEMENT SURGERY  1980   CARPAL TUNNEL RELEASE Bilateral    COLONOSCOPY     DORSAL COMPARTMENT RELEASE Right 09/15/2014   Procedure: RIGHT WRIST DEQUERVAINS RELEASE ;  Surgeon: Kathryne Hitch, MD;  Location: [place];  Service: Orthopedics;  Laterality: Right;   DRUG INDUCED ENDOSCOPY N/A 02/19/2022   Procedure: DRUG INDUCED SLEEP ENDOSCOPY;  Surgeon: Jerrell Belfast, MD;  Location: [place];  Service: ENT;  Laterality: N/A;   ESOPHAGOGASTRODUODENOSCOPY     EYE SURGERY     cataract surgery   IMPLANTATION OF HYPOGLOSSAL NERVE STIMULATOR Right 04/23/2022   Procedure: IMPLANTATION OF HYPOGLOSSAL NERVE STIMULATOR;  Surgeon: Jerrell Belfast, MD;  Location: [place];  Service: ENT;  Laterality: Right;   KNEE ARTHROSCOPY  04/15/2012   Procedure: ARTHROSCOPY KNEE;  Surgeon: [name], MD;  Location: [place];  Service: Orthopedics;  Laterality: Right;   LAPAROSCOPIC CHOLECYSTECTOMY     LASIK     OOPHORECTOMY     PALATE TO GINGIVA GRAFT  2017   REVERSE SHOULDER ARTHROPLASTY Left 02/13/2021   Procedure: REVERSE SHOULDER ARTHROPLASTY;  Surgeon: Marchia Bond, MD;  Location: [facility];  Service: Orthopedics;  Laterality: Left;   RIGHT/LEFT HEART CATH AND CORONARY ANGIOGRAPHY N/A 08/02/2016   Procedure: Right/Left Heart Cath and Coronary Angiography;  Surgeon: Sherren Mocha, MD;  Location: [place] [facility];  Service:  Cardiovascular;  Laterality: N/A;   ROTATOR CUFF REPAIR Left    TEE WITHOUT CARDIOVERSION N/A 08/23/2016   Procedure: TRANSESOPHAGEAL ECHOCARDIOGRAM (TEE);  Surgeon: Rexene Alberts, MD;  Location: [place];  Service: Open Heart Surgery;  Laterality: N/A;   TOTAL KNEE ARTHROPLASTY  04/15/2012   Procedure: TOTAL KNEE ARTHROPLASTY;  Surgeon: [name], MD;  Location: [place];  Service: Orthopedics;  Laterality: Right;   TRIGGER FINGER RELEASE Right 04/20/2015   Procedure: RIGHT TRIGGER FINGER RELEASE (TENDON SHEALTH INCISION) ;  Surgeon: [name], MD;  Location: [place];  Service: Orthopedics;  Laterality: Right;   Patient Active Problem List   Diagnosis Date Noted   Class 2 severe obesity with  serious comorbidity and body mass index (BMI) of 36.0 to 36.9 in adult [facility]) 05/09/2022   Type 2 diabetes mellitus with obesity ([place]) 05/09/2022   Hypertension associated with type 2 diabetes mellitus ([place]) 05/09/2022   SOB (shortness of breath) 12/10/2021   S/P reverse total shoulder arthroplasty, left 02/13/2021   Idiopathic hyperphosphatasia 08/03/2019   Vitamin D deficiency 06/04/2018   Hyponatremia 02/17/2018   History of CVA in adulthood 08/13/2017   Blurry vision 08/13/2017   Primary hypertension 01/02/2017   S/P minimally invasive aortic valve replacement with bioprosthetic valve [id number]   Diastolic dysfunction    Osteopenia 05/27/2016   OSA (obstructive sleep apnea) 02/27/2016   Bruxism 10/23/2015   Elevated alkaline phosphatase level 05/16/2015   Atypical chest pain 11/10/2014   Acne 05/12/2014   Diabetes mellitus ([place]) 05/12/2014   GERD (gastroesophageal reflux disease) 03/10/2014   Generalized anxiety disorder 03/10/2014   Migraines 03/10/2014   EUSTACHIAN TUBE DYSFUNCTION, CHRONIC 06/14/2010   Irritable bowel syndrome 12/15/2008   Low back pain 08/11/2008   Hyperlipidemia associated with type 2 diabetes mellitus ([place]) 02/09/2008   Allergic rhinitis 02/12/2007   Asthma 02/12/2007    PCP: Garret Reddish O,MD  REFERRING PROVIDER: Marchia Bond, MD  REFERRING DIAG: Rt Shoulder impingement   THERAPY DIAG:  Stiffness of right shoulder, not elsewhere classified  Muscle weakness (generalized)  Impingement of right shoulder  Rationale for Evaluation and Treatment: Rehabilitation  ONSET DATE: 08/19/22  SUBJECTIVE:                                                                                                                                                                                      SUBJECTIVE STATEMENT: "I still hurt"   PERTINENT HISTORY: L TSR 2022, R TKR 2013, heart valve replacement, just returned to Dr with good report.  PAIN:  Are you  having pain? Yes: NPRS scale: 6/10 Pain location: R shoulder and proximal arm  Pain description: sharp Aggravating factors: raising her arm with resistance- object, using arm to rotate her bra around. Relieving factors: Not using her arm, don't sleep on R.  PRECAUTIONS: None  WEIGHT BEARING RESTRICTIONS: No  FALLS:  Has patient fallen in last 6 months? No  LIVING ENVIRONMENT: Lives with: lives with their spouse Lives in: House/apartment Stairs: Yes: External: 5 steps; on left going up Has following equipment at home: None  OCCUPATION: Retired. Trying to lose weight, so wants/needs to exercise more, but the shoulder pain limits her ability to exercise.  PLOF: Independent  PATIENT GOALS:Decrease pain. Return to using her R arm wihtout pain.  NEXT MD VISIT: 09/16/22  OBJECTIVE:   DIAGNOSTIC FINDINGS:  X ray 2/27/24showed preservation of the bony architecure of the R shoulder   SURVEYS:  FOTO 48  COGNITION: Overall cognitive status: Within functional limits for tasks assessed     SENSATION: WFL  POSTURE: Slightly forward head, R shoulder rounded forward and elevated, flattened spinal curves, mildly winging scapulae  UPPER EXTREMITY ROM: L WFL, Active elevation mildly limited. Elbow and wrist WFL B.  Active ROM Right eval R 09/13/22   Shoulder flexion 102 139  Shoulder extension 102 102  Shoulder abduction    Shoulder adduction    Shoulder internal rotation WNL 60  Shoulder external rotation 32 75  Elbow flexion    Elbow extension    Wrist flexion    Wrist extension    Wrist ulnar deviation    Wrist radial deviation    Wrist pronation    Wrist supination    (Blank rows = not tested)  UPPER EXTREMITY MMT:  MMT Right eval Left eval  Shoulder flexion    Shoulder extension    Shoulder abduction    Shoulder adduction    Shoulder internal rotation    Shoulder external rotation    Middle trapezius    Lower trapezius    Elbow flexion    Elbow extension     Wrist flexion    Wrist extension    Wrist ulnar deviation    Wrist radial deviation    Wrist pronation    Wrist supination    Grip strength (lbs)    (Blank rows = not tested)  SHOULDER SPECIAL TESTS: Impingement tests: Hawkins/Kennedy impingement test: positive  and Painful arc test: positive  Rotator cuff assessment: Drop arm test: positive , Empty can test: positive , Full can test: positive , and Gerber lift off test: negative Biceps assessment: Speed's test: positive   JOINT MOBILITY TESTING:  R humeral glide limited, most in post glide, also inferior glide.  PALPATION:  TTP R cerv paraspinals, up traps, medial scapular border. Tightness noted in R cerv paraspinals, scalenes, UT, LS, pects. TTP supraspinatus tendon and long head of biceps tendon, R.   TODAY'S TREATMENT:  DATE:  09/13/22 AAROM Flex, Ext, IR up back  Biceps curls 2lb dumbbells x10 Triceps Ext 15lb 2x10 NuStep L5 x 6 min RUE ER/IR yellow Tband 2x10 Each PROM for R shoulder flex abd, ER CP to R shoulder x 10 minutes for inflammation.      09/11/22 NuStep L 4 x 6 min AAROM Flex, Ext, IR up back  Seated Rows yellow 2x10 Shoulder Ext Yellow 2x10 RUE ER/IR yellow, very weak with ER Triceps Ext 15lb 2x10, biceps curls 5lb x10 Biceps curls 2lb dumbbells x10 Pendulum exercises with 3# weight, deep breathing to increase stretch STM to cervical muscles and up traps, rhomboids  PROM for R shoulder flex abd, ER CP to R shoulder x 10 minutes for inflammation.   09/06/22 NuStep L4 x 6 minutes STM to cervical muscles and up traps, rhomboids on R, pects. Joint mobs, humeral depression and post glide, grade lll, 2 x 10 reps each. PROM for R shoulder flex abd, ER AAROM with 2# dowel, shoulder flex, abd, ext 10 reps each. Wall stars with yellow Tband, 2 x 5 reps each side Attempted  scap protraction against G Tband, but patient had difficulty activating the motion. Ball on wall pushes in diamond pattern 10 rounds. Reported some lateral/distal shoulder soreness after this activity. Pendulum exercises with 3# weight, deep breathing to increase stretch CP to R shoulder x 10 minutes for inflammation.    09/04/22 Attempted UBE, but too painful at this point [place] to R shoulder x 8 minutes STM and stretch to R up traps, pects, scalenes. Joint mobs, R humeral inf and post glide, grade lll, 2 x 10 reps PROM R shoulder flex, abd, IR/ER Seated posterior capsule stretch, seated humeral inf glide with hand on mat at 60 degrees Attempted doorway stretch and rotation while holding the frame to stretch pects, but caused too much pain Wall slides into flex and scaption, staying below 90 degrees of elevation to manage pain, 10 Standing shoulder ext, rows, ER against G tband for scapular stability. CP to R shoulder x 10 minutes.  08/28/22 Education  PATIENT EDUCATION: Education details: POC Person educated: Patient Education method: Customer service manager Education comprehension: verbalized understanding  HOME EXERCISE PROGRAM: [id number]  ASSESSMENT:  CLINICAL IMPRESSION: Pt has improved increasing her R shoulder AROM. Pt tolerated a progression of stretching and strengthening activities today. RUE ER was very weak and did cause some pain. Constant cues needed to relax with PROM. Pain reported with passive ER. finished with CP for pain relief.  OBJECTIVE IMPAIRMENTS: decreased activity tolerance, decreased coordination, decreased ROM, decreased strength, impaired flexibility, impaired UE functional use, improper body mechanics, postural dysfunction, and pain.   ACTIVITY LIMITATIONS: carrying, lifting, sleeping, reach over head, and hygiene/grooming  PARTICIPATION LIMITATIONS: cleaning, laundry, community activity, and yard work  PERSONAL FACTORS: Past/current experiences  are also affecting patient's functional outcome.   REHAB POTENTIAL: Good  CLINICAL DECISION MAKING: Stable/uncomplicated  EVALUATION COMPLEXITY: low   GOALS: Goals reviewed with patient? Yes  SHORT TERM GOALS: Target date: 09/09/22  I with initial HEP Baseline: Goal status:09/04/22-Initial program provided.  LONG TERM GOALS: Target date: 11/20/22  I with final HEP Baseline:  Goal status: INITIAL  2.  Patient will perform AROM of R shoulder at least 120 elevation, with<3/10 pain. Baseline: 102, with increased pain Goal status: PROM WNL, but still having pain with active elevation 09/06/22-ongoing  3.  Increase R shoulder strength to at least 4-/5 Baseline: Unable to tolerate resistance due to pain Goal status:  09/06/22-ongoing  4.  Increase FOTO score to at least 61 Baseline: 48 Goal status: INITIAL  5.  Patient will be able to manage styling her hair and putting on her bra with R shoulder pain < 3/10 Baseline: 8/10 Goal status: Progressing 6/10  PLAN:  PT FREQUENCY: 1-2x/week  PT DURATION: 12 weeks  PLANNED INTERVENTIONS: Therapeutic exercises, Therapeutic activity, Neuromuscular re-education, Balance training, Gait training, Patient/Family education, Self Care, Joint mobilization, Dry Needling, Electrical stimulation, Cryotherapy, Moist heat, Taping, Ionotophoresis 4mg /ml Dexamethasone, and Manual therapy  PLAN FOR NEXT SESSION: HEP, STM, acute pain and inflammation treatment. **09/13/22- Print out treatment prior to Dr appointment on 09/16/22. Also, try DN**   Marcelina Morel, DPT 09/13/2022, 11:41 AM

## 2022-09-16 DIAGNOSIS — S46011A Strain of muscle(s) and tendon(s) of the rotator cuff of right shoulder, initial encounter: Secondary | ICD-10-CM | POA: Diagnosis not present

## 2022-09-16 NOTE — Progress Notes (Unsigned)
Cardiology Office Note:    Date:  09/19/2022   ID:  JAKAYLIN LATNEY, DOB 02-17-1951, MRN [id number]  PCP:  [name], MD   [facility] Providers Cardiologist:  Freada Bergeron, MD {   Referring MD: [name], MD    History of Present Illness:    [name] is a 72 y.o. female with a hx of DMII, HLD, OSA on CPAP, and severe AS s/p bioprosthetic AVR in 08/2016 who was previously followed by Dr. Meda Coffee who now returns to clinic for follow-up.  Per review of the record, the patient has a history of aortic valve replacement with bioprosthetic bovine pericardial tissue valve 08/23/16. Pre-op cath with only minor nonobstructive CAD. Also right heart cath showed normal filling pressures. Echocardiogram showed normal EF of 60-65% and grade 1 diastolic dysfunction.  The patient was very motivated post surgery, underwent complete rehab program and dietary program and has lost 30 pounds.   Has history of occipital stroke in 2019. Cardiac monitor negative for Afib. Has been maintained on plavix.  Last seen in clinic on 02/2022 for preop clearance prior to hypoglossal nerve stimulator. Was doing well at that time.  Today, the patient states she did a sleep study and feels exhausted today. Had a lot of caffeine prior to her appointment which she thinks is contributing to her blood pressure today. Otherwise, no chest pain, SOB, LE edema, orthopnea, PND or palpitations. Blood pressure mainly in 120s at healthy weight and wellness. Tolerating medications as prescribed. Continuing to work on losing weight and very determined to do so. Has lost 30lbs.   Past Medical History:  Diagnosis Date   Allergy    Anxiety    Arthritis    back & knee   Asthma     mild per pt shows up with resp illness   Colon polyps    Constipation    Diabetes mellitus without complication (HCC)    Gallstones    Gastric polyps    Gastroparesis    GERD (gastroesophageal reflux disease)     Headache    sinus headaches and migraines at times   Heart murmur    History of migraine headaches    HTN (hypertension)    Hyperlipidemia    IBS (irritable bowel syndrome)    Joint pain    Lumbar disc disease    PONV (postoperative nausea and vomiting)    S/P aortic valve replacement with bioprosthetic valve 08/23/2016   25 mm Edwards Intuity rapid-deployment bovine pericardial tissue valve via partial upper mini sternotomy   Sleep apnea    does not use CPAP   TIA (transient ischemic attack)    TIA (transient ischemic attack)    hx of per pt    Past Surgical History:  Procedure Laterality Date   ABDOMINAL HYSTERECTOMY     AORTIC VALVE REPLACEMENT N/A 08/23/2016   Procedure: AORTIC VALVE REPLACEMENT (AVR) - using partial Upper Sternotomy- 25mm Edwards Intuity Aortic Valve used;  Surgeon: Rexene Alberts, MD;  Location: [place];  Service: Open Heart Surgery;  Laterality: N/A;   BLADDER SUSPENSION  2007   BREAST ENHANCEMENT SURGERY  1980   CARPAL TUNNEL RELEASE Bilateral    COLONOSCOPY     DORSAL COMPARTMENT RELEASE Right 09/15/2014   Procedure: RIGHT WRIST DEQUERVAINS RELEASE ;  Surgeon: Kathryne Hitch, MD;  Location: [place];  Service: Orthopedics;  Laterality: Right;   DRUG INDUCED ENDOSCOPY N/A 02/19/2022   Procedure: DRUG INDUCED SLEEP  ENDOSCOPY;  Surgeon: Jerrell Belfast, MD;  Location: [place];  Service: ENT;  Laterality: N/A;   ESOPHAGOGASTRODUODENOSCOPY     EYE SURGERY     cataract surgery   IMPLANTATION OF HYPOGLOSSAL NERVE STIMULATOR Right 04/23/2022   Procedure: IMPLANTATION OF HYPOGLOSSAL NERVE STIMULATOR;  Surgeon: Jerrell Belfast, MD;  Location: [place];  Service: ENT;  Laterality: Right;   KNEE ARTHROSCOPY  04/15/2012   Procedure: ARTHROSCOPY KNEE;  Surgeon: [name], MD;  Location: [place];  Service: Orthopedics;  Laterality: Right;   LAPAROSCOPIC CHOLECYSTECTOMY     LASIK     OOPHORECTOMY     PALATE TO  GINGIVA GRAFT  2017   REVERSE SHOULDER ARTHROPLASTY Left 02/13/2021   Procedure: REVERSE SHOULDER ARTHROPLASTY;  Surgeon: Marchia Bond, MD;  Location: [facility];  Service: Orthopedics;  Laterality: Left;   RIGHT/LEFT HEART CATH AND CORONARY ANGIOGRAPHY N/A 08/02/2016   Procedure: Right/Left Heart Cath and Coronary Angiography;  Surgeon: Sherren Mocha, MD;  Location: [place] [facility];  Service: Cardiovascular;  Laterality: N/A;   ROTATOR CUFF REPAIR Left    TEE WITHOUT CARDIOVERSION N/A 08/23/2016   Procedure: TRANSESOPHAGEAL ECHOCARDIOGRAM (TEE);  Surgeon: Rexene Alberts, MD;  Location: [place];  Service: Open Heart Surgery;  Laterality: N/A;   TOTAL KNEE ARTHROPLASTY  04/15/2012   Procedure: TOTAL KNEE ARTHROPLASTY;  Surgeon: [name], MD;  Location: [place];  Service: Orthopedics;  Laterality: Right;   TRIGGER FINGER RELEASE Right 04/20/2015   Procedure: RIGHT TRIGGER FINGER RELEASE (TENDON SHEALTH INCISION) ;  Surgeon: [name], MD;  Location: [place];  Service: Orthopedics;  Laterality: Right;    Current Medications: Current Meds  Medication Sig   ACCU-CHEK GUIDE test strip USE TO TEST BLOOD SUGARS DAILY. DX: E11.9   Accu-Chek Softclix Lancets lancets USE AS INSTRUCTED   amLODipine (NORVASC) 10 MG tablet Take 1 tablet (10 mg total) by mouth daily.   atorvastatin (LIPITOR) 40 MG tablet TAKE 1 TABLET BY MOUTH DAILY AT 6 PM.   Biotin 5 MG TBDP SMARTSIG:1 By Mouth   blood glucose meter kit and supplies KIT Dispense based on patient and insurance preference. Use up to four times daily as directed. Dx: E11.9   calcium-vitamin D (OSCAL WITH D) 500-200 MG-UNIT per tablet Take 2 tablets by mouth daily.    cloNIDine (CATAPRES) 0.1 MG tablet Take 1 tablet (0.1 mg total) by mouth at bedtime.   clopidogrel (PLAVIX) 75 MG tablet TAKE 1 TABLET BY MOUTH EVERY DAY   D-5000 125 MCG (5000 UT) TABS SMARTSIG:1 Tablet(s) By Mouth   demeclocycline (DECLOMYCIN) 300 MG tablet Take 1  tablet (300 mg total) by mouth 2 (two) times daily.   fluticasone (FLONASE) 50 MCG/ACT nasal spray Place 2 sprays into both nostrils daily.   ipratropium (ATROVENT) 0.03 % nasal spray 2 SPRAYS IN EACH NOSTRIL AS NEEDED THREE TIMES AS NEEDED   irbesartan (AVAPRO) 300 MG tablet Take 1 tablet (300 mg total) by mouth daily.   loratadine (CLARITIN) 10 MG tablet Take 10 mg by mouth at bedtime.    Melatonin 5 MG TABS Take 5 mg by mouth at bedtime as needed (sleep).   metFORMIN (GLUCOPHAGE) 500 MG tablet TAKE 1 TABLET BY MOUTH WITH BREAKFAST AND 1/2 TO 1 TABLET WITH DINNER (Patient taking differently: Take 500 mg by mouth daily. Taking 1 tablet one time daily.)   metoCLOPramide (REGLAN) 10 MG tablet TAKE 1/2 TABLET BY MOUTH 4 TIMES A DAY  metroNIDAZOLE (METROCREAM) 0.75 % cream    Multiple Vitamins-Minerals (MULTIVITAMIN ADULTS) TABS SMARTSIG:1 By Mouth   omeprazole (PRILOSEC) 40 MG capsule Take 1 capsule by mouth daily.   Probiotic Product (ALIGN) 4 MG CAPS Take 4 mg by mouth daily.    psyllium (METAMUCIL SMOOTH TEXTURE) 28 % packet Take 1 packet by mouth 2 (two) times daily.   sodium chloride 1 g tablet Take 3 g by mouth 2 (two) times daily with a meal.   venlafaxine XR (EFFEXOR-XR) 75 MG 24 hr capsule TAKE 1 CAPSULE BY MOUTH DAILY WITH BREAKFAST.     Allergies:   Augmentin [amoxicillin-pot clavulanate], Erythromycin, and Penicillins   Social History   Socioeconomic History   Marital status: Married    Spouse name: [name]   Number of children: 1   Years of education: Not on file   Highest education level: Not on file  Occupational History   Occupation: Retired, Curator: RETIRED  Tobacco Use   Smoking status: Never   Smokeless tobacco: Never  Vaping Use   Vaping Use: Never used  Substance and Sexual Activity   Alcohol use: No   Drug use: No   Sexual activity: Yes    Birth control/protection: Surgical    Comment: hyst  Other Topics Concern   Not on file   Social History Narrative   She lives with husband (1978) and two dogs. Step-son [name] (1971-psychiatrist in [place]). No grandkids. 2 dogs-sheltie/collie mix and border collie mix      Highest level of education:  Master in education   She is retired Animal nutritionist x 32 years.      Hobbies: time with dogs   Right handed   One story home         Social Determinants of Health   Financial Resource Strain: Low Risk  (04/21/2020)   Overall Financial Resource Strain (CARDIA)    Difficulty of Paying Living Expenses: Not hard at all  Food Insecurity: No Food Insecurity (02/20/2022)   Hunger Vital Sign    Worried About Running Out of Food in the Last Year: Never true    Ran Out of Food in the Last Year: Never true  Transportation Needs: No Transportation Needs (02/20/2022)   PRAPARE - Hydrologist (Medical): No    Lack of Transportation (Non-Medical): No  Physical Activity: Insufficiently Active (04/21/2020)   Exercise Vital Sign    Days of Exercise per Week: 3 days    Minutes of Exercise per Session: 20 min  Stress: No Stress Concern Present (04/21/2020)   Green    Feeling of Stress : Not at all  Social Connections: Moderately Isolated (04/21/2020)   Social Connection and Isolation Panel [NHANES]    Frequency of Communication with Friends and Family: More than three times a week    Frequency of Social Gatherings with Friends and Family: Once a week    Attends Religious Services: Never    Marine scientist or Organizations: No    Attends Music therapist: Never    Marital Status: Married     Family History: The patient's family history includes Alcohol abuse in her father; Anxiety disorder in her mother; Breast cancer in her maternal grandmother; COPD in her mother; Colon polyps in her maternal aunt and mother; Diabetes in her paternal uncle; Gallbladder  disease in her maternal grandmother; Heart disease in her father;  Irritable bowel syndrome in her mother. There is no history of Other, Colon cancer, Esophageal cancer, Rectal cancer, or Stomach cancer.  ROS:   Please see the history of present illness.    Review of Systems  Constitutional:  Negative for malaise/fatigue and weight loss.  HENT:  Negative for congestion and sore throat.   Eyes:  Negative for blurred vision.  Respiratory:  Negative for cough and shortness of breath.   Cardiovascular:  Negative for chest pain, palpitations, orthopnea, claudication, leg swelling and PND.  Gastrointestinal:  Negative for heartburn and nausea.  Genitourinary:  Negative for dysuria and urgency.  Musculoskeletal:  Negative for joint pain and myalgias.  Skin:  Negative for itching and rash.  Neurological:  Negative for dizziness and headaches.  Endo/Heme/Allergies:  Does not bruise/bleed easily.  Psychiatric/Behavioral:  The patient is not nervous/anxious and does not have insomnia.    All other systems reviewed and are negative.  EKGs/Labs/Other Studies Reviewed:    The following studies were reviewed today: Echo 08/30/21 1. Left ventricular ejection fraction, by estimation, is 60 to 65%. The left ventricle has normal function. The left ventricle has no regional wall motion abnormalities. Left ventricular diastolic parameters were normal.   2. Right ventricular systolic function is normal. The right ventricular size is normal.   3. The mitral valve is normal in structure. Mild to moderate mitral valve regurgitation. No evidence of mitral stenosis.   4. The aortic valve has been repaired/replaced. Perivalvular Aortic valve regurgitation is mild to moderate and eccentric. There is a 25 mm Edwards Intuity bovine pericardial valve present in the aortic position. Procedure Date: 08/23/2016. Aortic valve  area, by VTI measures 1.78 cm. Aortic valve mean gradient measures 9.0 mmHg. Aortic valve Vmax  measures 2.07 m/s.   5. The inferior vena cava is normal in size with greater than 50% respiratory variability, suggesting right atrial pressure of 3 mmHg.   Monitor 08/26/17 Sinus rhythm the entire monitoring time. Sinus rhythm the entire monitoring time. No arrhythmias detected, no atrial fibrillation.  Head CT 08/12/17 IMPRESSION: 1. Small, patchy hypodensity in the right occipital lobe, concerning for subacute infarct.  Right/Left Heart Cath and Coronary Angiography 08/02/16   1. Minor nonobstructive CAD 2. Normal right heart hemodynamics (including normal PCWP) 3. Known severe aortic stenosis     Intraoperative TEE 08/23/16 Left ventricle: Normal cavity size. Concentric hypertrophy of moderate severity. LV systolic function is normal with an EF of 60-65%. There are no obvious wall motion abnormalities. Septum: No Patent Foramen Ovale present. Left atrium: Patent foramen ovale not present. Aortic valve: The valve is bicuspid. Severe valve thickening present. Severe valve calcification present. Moderately decreased leaflet separation. Severe stenosis. No regurgitation. No AV vegetation. No evidence of papillary fibroelastoma. Aorta: The ascending aorta is mildly dilated. Mitral valve: No leaflet thickening and calcification present. Mild mitral annular calcification. Right ventricle: Normal cavity size, wall thickness and ejection fraction. Tricuspid valve: Trace regurgitation. The tricuspid valve regurgitation jet is central.     TTE: 08/26/2017  Left ventricle: The cavity size was mildly dilated. Wall   thickness was increased in a pattern of mild LVH. Systolic   function was normal. The estimated ejection fraction was in the range of 60% to 65%. Left ventricular diastolic function parameters were normal. - Aortic valve: patient had a Edwards 25 mm Intuity Elite rapid  deployment bovine pericardial valve placed 09/12/16. 05/01/17 mean   gradient was 6 mmHg peak gradient 12 mmHg. Gradients  have increased now 17  mmHg and 34 mmHg peak. There is a mild leak seen through the basal stented portion of the valve appears at around 10:00 on BSA views - Mitral valve: There was mild regurgitation. - Atrial septum: No defect or patent foramen ovale was identified.   TTE12/21/2020  1. Left ventricular ejection fraction, by visual estimation, is 60 to 65%. The left ventricle has normal function. There is no left ventricular hypertrophy.   2. Abnormal septal motion consistent with post-operative status.   3. The left ventricle has no regional wall motion abnormalities.   4. Global right ventricle has normal systolic function.The right ventricular size is normal. No increase in right ventricular wall thickness.   5. Left atrial size was mildly dilated.   6. Right atrial size was normal.   7. Bioprosthetic aortic valve with previously described mild perivalvular leak. Normal prosthetic gradients.   8. The mitral valve is normal in structure. Trivial mitral valve regurgitation. No evidence of mitral stenosis.   9. The tricuspid valve is normal in structure. Tricuspid valve regurgitation is not demonstrated.  10. The pulmonic valve was normal in structure. Pulmonic valve regurgitation is not visualized.  11. The inferior vena cava is normal in size with greater than 50% respiratory variability, suggesting right atrial pressure of 3 mmHg.  12. Aortic prosthesis gradients are lower, otherwise no change since 08/26/2017.  13. Prior images reviewed side by side.  EKG:  EKG was not ordered today  Recent Labs: 04/03/2022: ALT 13 04/18/2022: BUN 19; Creatinine, Ser 0.53; Potassium 4.4; Sodium 134 06/12/2022: Hemoglobin 11.4; Platelets 387.0  Recent Lipid Panel    Component Value Date/Time   CHOL 144 06/05/2021 1230   TRIG 75 06/05/2021 1230   HDL 64 06/05/2021 1230   CHOLHDL 2.3 06/05/2021 1230   CHOLHDL 2 06/08/2019 0949   VLDL 13.0 06/08/2019 0949   LDLCALC 65 06/05/2021 1230   LDLDIRECT  67.0 04/03/2022 1000     Risk Assessment/Calculations:           Physical Exam:    VS:  BP 128/82   Pulse 80   Ht 5\' 6"  (1.676 m)   Wt 200 lb (90.7 kg)   LMP  (LMP Unknown)   SpO2 99%   BMI 32.28 kg/m     Wt Readings from Last 3 Encounters:  09/19/22 200 lb (90.7 kg)  09/18/22 193 lb (87.5 kg)  09/05/22 197 lb 6.4 oz (89.5 kg)     GEN: Well nourished, well developed in no acute distress HEENT: Normal NECK: No JVD; No carotid bruits CARDIAC: RRR, 1/6 systolic murmur RESPIRATORY:  Clear to auscultation without rales, wheezing or rhonchi  ABDOMEN: Soft, non-tender, non-distended MUSCULOSKELETAL:  No edema; No deformity  SKIN: Warm and dry NEUROLOGIC:  Alert and oriented x 3 PSYCHIATRIC:  Normal affect   ASSESSMENT:    1. Chronic diastolic congestive heart failure ([place])   2. Essential hypertension   3. Paravalvular leak of prosthetic heart valve, initial encounter   4. S/P aortic valve replacement with bioprosthetic valve   5. History of CVA in adulthood   6. OSA (obstructive sleep apnea)   7. Mixed hyperlipidemia   8. Hypertension associated with diabetes ([place])   9. Aortic valve insufficiency, etiology of cardiac valve disease unspecified    PLAN:    In order of problems listed above:  #Severe AS s/p bioprosthetic AVR in 2018: #Mild-to-Moderate PVL: Most recent TTE 08/2022 with LVEF 60-65%, normal RV, mild-to moderate MR, mild to moderate bioprosthetic AR with  mean gradient 65mmHg. Doing very well with no HF or exertional symptoms -Continue serial monitoring with TTE (next 08/2022)  #Nonobstructive CAD: Pre-op cath with mild disease. No anginal symptoms.   #Chronic Diastolic HF: Euvolemic and compensated on exam with NYHA class I-II symptoms.  -Continue irbesartan 300mg  daily -Low Na diet  #HLD: -Continue lipitor 40mg  daily -LDL controlled at 67 (goal <70) -Followed by PCP  #OSA now s/p Inspire Device Placement: -Follows with  ENT  #HTN: Controlled at home <130/90. -Continue amlodipine 10mg  daily -Continue clonidine 0.1mg  qHS -Continue irbesartan 300mg  daily  #History of CVA: Occurred in 2019. CT head with right occipital infarct. Cardiac monitor with no evidence of Afib. Maintained on lipitor and plavix. -Continue plavix 75mg  daily -Continue lipitor 40mg  daily      Medication Adjustments/Labs and Tests Ordered: Current medicines are reviewed at length with the patient today.  Concerns regarding medicines are outlined above.  No orders of the defined types were placed in this encounter.  No orders of the defined types were placed in this encounter.   Patient Instructions  Medication Instructions:   Your physician recommends that you continue on your current medications as directed. Please refer to the Current Medication list given to you today.  *If you need a refill on your cardiac medications before your next appointment, please call your pharmacy*    Follow-Up: At [facility], you and your health needs are our priority.  As part of our continuing mission to provide you with exceptional heart care, we have created designated Provider Care Teams.  These Care Teams include your primary Cardiologist (physician) and Advanced Practice Providers (APPs -  Physician Assistants and Nurse Practitioners) who all work together to provide you with the care you need, when you need it.  We recommend signing up for the patient portal called "MyChart".  Sign up information is provided on this After Visit Summary.  MyChart is used to connect with patients for Virtual Visits (Telemedicine).  Patients are able to view lab/test results, encounter notes, upcoming appointments, etc.  Non-urgent messages can be sent to your provider as well.   To learn more about what you can do with MyChart, go to [email or web address].    Your next appointment:   1 year(s)  Provider:   Freada Bergeron, MD            Signed, Freada Bergeron, MD  09/19/2022 10:18 AM    [place]

## 2022-09-17 ENCOUNTER — Encounter: Payer: Self-pay | Admitting: Physical Therapy

## 2022-09-17 ENCOUNTER — Ambulatory Visit: Payer: Medicare PPO | Admitting: Physical Therapy

## 2022-09-17 DIAGNOSIS — M6281 Muscle weakness (generalized): Secondary | ICD-10-CM | POA: Diagnosis not present

## 2022-09-17 DIAGNOSIS — M25512 Pain in left shoulder: Secondary | ICD-10-CM | POA: Diagnosis not present

## 2022-09-17 DIAGNOSIS — M5442 Lumbago with sciatica, left side: Secondary | ICD-10-CM | POA: Diagnosis not present

## 2022-09-17 DIAGNOSIS — M5441 Lumbago with sciatica, right side: Secondary | ICD-10-CM | POA: Diagnosis not present

## 2022-09-17 DIAGNOSIS — Z96612 Presence of left artificial shoulder joint: Secondary | ICD-10-CM | POA: Diagnosis not present

## 2022-09-17 DIAGNOSIS — M25811 Other specified joint disorders, right shoulder: Secondary | ICD-10-CM | POA: Diagnosis not present

## 2022-09-17 DIAGNOSIS — R600 Localized edema: Secondary | ICD-10-CM | POA: Diagnosis not present

## 2022-09-17 DIAGNOSIS — M25611 Stiffness of right shoulder, not elsewhere classified: Secondary | ICD-10-CM

## 2022-09-17 DIAGNOSIS — M25612 Stiffness of left shoulder, not elsewhere classified: Secondary | ICD-10-CM | POA: Diagnosis not present

## 2022-09-17 NOTE — Therapy (Signed)
OUTPATIENT PHYSICAL THERAPY SHOULDER TREATMENT   Patient Name: [name] MRN: [id number] DOB:February 02, 1951, 72 y.o., female Today's Date: 09/17/2022  END OF SESSION:  PT End of Session - 09/17/22 1254     Visit Number 6    Date for PT Re-Evaluation 11/20/22    PT Start Time 1255    PT Stop Time 1340    PT Time Calculation (min) 45 min    Activity Tolerance Patient limited by pain    Behavior During Therapy [facility] for tasks assessed/performed             Past Medical History:  Diagnosis Date   Allergy    Anxiety    Arthritis    back & knee   Asthma     mild per pt shows up with resp illness   Colon polyps    Constipation    Diabetes mellitus without complication (HCC)    Gallstones    Gastric polyps    Gastroparesis    GERD (gastroesophageal reflux disease)    Headache    sinus headaches and migraines at times   Heart murmur    History of migraine headaches    HTN (hypertension)    Hyperlipidemia    IBS (irritable bowel syndrome)    Joint pain    Lumbar disc disease    PONV (postoperative nausea and vomiting)    S/P aortic valve replacement with bioprosthetic valve 08/23/2016   25 mm Edwards Intuity rapid-deployment bovine pericardial tissue valve via partial upper mini sternotomy   Sleep apnea    does not use CPAP   TIA (transient ischemic attack)    TIA (transient ischemic attack)    hx of per pt   Past Surgical History:  Procedure Laterality Date   ABDOMINAL HYSTERECTOMY     AORTIC VALVE REPLACEMENT N/A 08/23/2016   Procedure: AORTIC VALVE REPLACEMENT (AVR) - using partial Upper Sternotomy- 65mm Edwards Intuity Aortic Valve used;  Surgeon: Rexene Alberts, MD;  Location: [place];  Service: Open Heart Surgery;  Laterality: N/A;   BLADDER SUSPENSION  2007   BREAST ENHANCEMENT SURGERY  1980   CARPAL TUNNEL RELEASE Bilateral    COLONOSCOPY     DORSAL COMPARTMENT RELEASE Right 09/15/2014   Procedure: RIGHT WRIST DEQUERVAINS RELEASE ;  Surgeon: Kathryne Hitch, MD;  Location: [place];  Service: Orthopedics;  Laterality: Right;   DRUG INDUCED ENDOSCOPY N/A 02/19/2022   Procedure: DRUG INDUCED SLEEP ENDOSCOPY;  Surgeon: Jerrell Belfast, MD;  Location: [place];  Service: ENT;  Laterality: N/A;   ESOPHAGOGASTRODUODENOSCOPY     EYE SURGERY     cataract surgery   IMPLANTATION OF HYPOGLOSSAL NERVE STIMULATOR Right 04/23/2022   Procedure: IMPLANTATION OF HYPOGLOSSAL NERVE STIMULATOR;  Surgeon: Jerrell Belfast, MD;  Location: [place];  Service: ENT;  Laterality: Right;   KNEE ARTHROSCOPY  04/15/2012   Procedure: ARTHROSCOPY KNEE;  Surgeon: [name], MD;  Location: [place];  Service: Orthopedics;  Laterality: Right;   LAPAROSCOPIC CHOLECYSTECTOMY     LASIK     OOPHORECTOMY     PALATE TO GINGIVA GRAFT  2017   REVERSE SHOULDER ARTHROPLASTY Left 02/13/2021   Procedure: REVERSE SHOULDER ARTHROPLASTY;  Surgeon: Marchia Bond, MD;  Location: [facility];  Service: Orthopedics;  Laterality: Left;   RIGHT/LEFT HEART CATH AND CORONARY ANGIOGRAPHY N/A 08/02/2016   Procedure: Right/Left Heart Cath and Coronary Angiography;  Surgeon: Sherren Mocha, MD;  Location: [place] [facility];  Service:  Cardiovascular;  Laterality: N/A;   ROTATOR CUFF REPAIR Left    TEE WITHOUT CARDIOVERSION N/A 08/23/2016   Procedure: TRANSESOPHAGEAL ECHOCARDIOGRAM (TEE);  Surgeon: Rexene Alberts, MD;  Location: [place];  Service: Open Heart Surgery;  Laterality: N/A;   TOTAL KNEE ARTHROPLASTY  04/15/2012   Procedure: TOTAL KNEE ARTHROPLASTY;  Surgeon: [name], MD;  Location: [place];  Service: Orthopedics;  Laterality: Right;   TRIGGER FINGER RELEASE Right 04/20/2015   Procedure: RIGHT TRIGGER FINGER RELEASE (TENDON SHEALTH INCISION) ;  Surgeon: [name], MD;  Location: [place];  Service: Orthopedics;  Laterality: Right;   Patient Active Problem List   Diagnosis Date Noted   Class 2 severe obesity with  serious comorbidity and body mass index (BMI) of 36.0 to 36.9 in adult ([place]) 05/09/2022   Type 2 diabetes mellitus with obesity ([place]) 05/09/2022   Hypertension associated with type 2 diabetes mellitus ([place]) 05/09/2022   SOB (shortness of breath) 12/10/2021   S/P reverse total shoulder arthroplasty, left 02/13/2021   Idiopathic hyperphosphatasia 08/03/2019   Vitamin D deficiency 06/04/2018   Hyponatremia 02/17/2018   History of CVA in adulthood 08/13/2017   Blurry vision 08/13/2017   Primary hypertension 01/02/2017   S/P minimally invasive aortic valve replacement with bioprosthetic valve [id number]   Diastolic dysfunction    Osteopenia 05/27/2016   OSA (obstructive sleep apnea) 02/27/2016   Bruxism 10/23/2015   Elevated alkaline phosphatase level 05/16/2015   Atypical chest pain 11/10/2014   Acne 05/12/2014   Diabetes mellitus ([place]) 05/12/2014   GERD (gastroesophageal reflux disease) 03/10/2014   Generalized anxiety disorder 03/10/2014   Migraines 03/10/2014   EUSTACHIAN TUBE DYSFUNCTION, CHRONIC 06/14/2010   Irritable bowel syndrome 12/15/2008   Low back pain 08/11/2008   Hyperlipidemia associated with type 2 diabetes mellitus ([place]) 02/09/2008   Allergic rhinitis 02/12/2007   Asthma 02/12/2007    PCP: Garret Reddish O,MD  REFERRING PROVIDER: Marchia Bond, MD  REFERRING DIAG: Rt Shoulder impingement   THERAPY DIAG:  Stiffness of right shoulder, not elsewhere classified  Impingement of right shoulder  Muscle weakness (generalized)  Rationale for Evaluation and Treatment: Rehabilitation  ONSET DATE: 08/19/22  SUBJECTIVE:                                                                                                                                                                                      SUBJECTIVE STATEMENT: "About the same"  PERTINENT HISTORY: L TSR 2022, R TKR 2013, heart valve replacement, just returned to Dr with good report.  PAIN:  Are you  having pain? Yes: NPRS scale: 5/10 Pain location: R shoulder and proximal arm Pain  description: sharp Aggravating factors: Moves certain ways Relieving factors: Not using her arm, don't sleep on R.  PRECAUTIONS: None  WEIGHT BEARING RESTRICTIONS: No  FALLS:  Has patient fallen in last 6 months? No  LIVING ENVIRONMENT: Lives with: lives with their spouse Lives in: House/apartment Stairs: Yes: External: 5 steps; on left going up Has following equipment at home: None  OCCUPATION: Retired. Trying to lose weight, so wants/needs to exercise more, but the shoulder pain limits her ability to exercise.  PLOF: Independent  PATIENT GOALS:Decrease pain. Return to using her R arm wihtout pain.  NEXT MD VISIT: 09/16/22  OBJECTIVE:   DIAGNOSTIC FINDINGS:  X ray 2/27/24showed preservation of the bony architecure of the R shoulder   SURVEYS:  FOTO 48  COGNITION: Overall cognitive status: Within functional limits for tasks assessed     SENSATION: WFL  POSTURE: Slightly forward head, R shoulder rounded forward and elevated, flattened spinal curves, mildly winging scapulae  UPPER EXTREMITY ROM: L WFL, Active elevation mildly limited. Elbow and wrist WFL B.  Active ROM Right eval R 09/13/22   Shoulder flexion 102 139  Shoulder extension 102 102  Shoulder abduction    Shoulder adduction    Shoulder internal rotation WNL 60  Shoulder external rotation 32 75  Elbow flexion    Elbow extension    Wrist flexion    Wrist extension    Wrist ulnar deviation    Wrist radial deviation    Wrist pronation    Wrist supination    (Blank rows = not tested)  UPPER EXTREMITY MMT:  MMT Right eval Left eval  Shoulder flexion    Shoulder extension    Shoulder abduction    Shoulder adduction    Shoulder internal rotation    Shoulder external rotation    Middle trapezius    Lower trapezius    Elbow flexion    Elbow extension    Wrist flexion    Wrist extension    Wrist ulnar  deviation    Wrist radial deviation    Wrist pronation    Wrist supination    Grip strength (lbs)    (Blank rows = not tested)  SHOULDER SPECIAL TESTS: Impingement tests: Hawkins/Kennedy impingement test: positive  and Painful arc test: positive  Rotator cuff assessment: Drop arm test: positive , Empty can test: positive , Full can test: positive , and Gerber lift off test: negative Biceps assessment: Speed's test: positive   JOINT MOBILITY TESTING:  R humeral glide limited, most in post glide, also inferior glide.  PALPATION:  TTP R cerv paraspinals, up traps, medial scapular border. Tightness noted in R cerv paraspinals, scalenes, UT, LS, pects. TTP supraspinatus tendon and long head of biceps tendon, R.   TODAY'S TREATMENT:  DATE:  09/17/22 NuStep L5 x 6 min RUE ER/IR yellow Tband 2x10 Each Rows and Ext red 2x10 Triceps Ext 15lb 2x15 AAROM Flex, Ext, IR up back 2lb WaTE Flex, abd, circles on wall RUE x10 each  Yellow band 3 way RUE 2x5 each PROM for R shoulder flex abd, ER CP to R shoulder x 10 minutes for inflammation.    09/13/22 AAROM Flex, Ext, IR up back  Biceps curls 2lb dumbbells x10 Triceps Ext 15lb 2x10 NuStep L5 x 6 min RUE ER/IR yellow Tband 2x10 Each PROM for R shoulder flex abd, ER CP to R shoulder x 10 minutes for inflammation.      09/11/22 NuStep L 4 x 6 min AAROM Flex, Ext, IR up back  Seated Rows yellow 2x10 Shoulder Ext Yellow 2x10 RUE ER/IR yellow, very weak with ER Triceps Ext 15lb 2x10, biceps curls 5lb x10 Biceps curls 2lb dumbbells x10 Pendulum exercises with 3# weight, deep breathing to increase stretch STM to cervical muscles and up traps, rhomboids  PROM for R shoulder flex abd, ER CP to R shoulder x 10 minutes for inflammation.   09/06/22 NuStep L4 x 6 minutes STM to cervical muscles and up traps,  rhomboids on R, pects. Joint mobs, humeral depression and post glide, grade lll, 2 x 10 reps each. PROM for R shoulder flex abd, ER AAROM with 2# dowel, shoulder flex, abd, ext 10 reps each. Wall stars with yellow Tband, 2 x 5 reps each side Attempted scap protraction against G Tband, but patient had difficulty activating the motion. Ball on wall pushes in diamond pattern 10 rounds. Reported some lateral/distal shoulder soreness after this activity. Pendulum exercises with 3# weight, deep breathing to increase stretch CP to R shoulder x 10 minutes for inflammation.    09/04/22 Attempted UBE, but too painful at this point [place] to R shoulder x 8 minutes STM and stretch to R up traps, pects, scalenes. Joint mobs, R humeral inf and post glide, grade lll, 2 x 10 reps PROM R shoulder flex, abd, IR/ER Seated posterior capsule stretch, seated humeral inf glide with hand on mat at 60 degrees Attempted doorway stretch and rotation while holding the frame to stretch pects, but caused too much pain Wall slides into flex and scaption, staying below 90 degrees of elevation to manage pain, 10 Standing shoulder ext, rows, ER against G tband for scapular stability. CP to R shoulder x 10 minutes.  08/28/22 Education  PATIENT EDUCATION: Education details: POC Person educated: Patient Education method: Customer service manager Education comprehension: verbalized understanding  HOME EXERCISE PROGRAM: [id number]  ASSESSMENT:  CLINICAL IMPRESSION: Pt tolerated a progression of stretching and strengthening activities today. RUE ER was very weak and did cause some pain. ROM interventions against wall were performed with pillow case. Pain reported with flexion and abduction against wall did cause some pain. Constant cues needed to relax with PROM. Pain reported with passive ER. finished with CP for pain relief. Pt stated that she is waiting on MD to decide bette MRI or CAT scan due to having and inspire  implant.  OBJECTIVE IMPAIRMENTS: decreased activity tolerance, decreased coordination, decreased ROM, decreased strength, impaired flexibility, impaired UE functional use, improper body mechanics, postural dysfunction, and pain.   ACTIVITY LIMITATIONS: carrying, lifting, sleeping, reach over head, and hygiene/grooming  PARTICIPATION LIMITATIONS: cleaning, laundry, community activity, and yard work  PERSONAL FACTORS: Past/current experiences are also affecting patient's functional outcome.   REHAB POTENTIAL: Good  CLINICAL DECISION MAKING: Stable/uncomplicated  EVALUATION  COMPLEXITY: low   GOALS: Goals reviewed with patient? Yes  SHORT TERM GOALS: Target date: 09/09/22  I with initial HEP Baseline: Goal status:09/04/22-Initial program provided.  LONG TERM GOALS: Target date: 11/20/22  I with final HEP Baseline:  Goal status: INITIAL  2.  Patient will perform AROM of R shoulder at least 120 elevation, with<3/10 pain. Baseline: 102, with increased pain Goal status: PROM WNL, partly met with flexion 09/17/22 but still having pain with active elevation  3.  Increase R shoulder strength to at least 4-/5 Baseline: Unable to tolerate resistance due to pain Goal status: 3/26\/24-ongoing  4.  Increase FOTO score to at least 61 Baseline: 48 Goal status: INITIAL  5.  Patient will be able to manage styling her hair and putting on her bra with R shoulder pain < 3/10 Baseline: 8/10 Goal status: Progressing 6/10  PLAN:  PT FREQUENCY: 1-2x/week  PT DURATION: 12 weeks  PLANNED INTERVENTIONS: Therapeutic exercises, Therapeutic activity, Neuromuscular re-education, Balance training, Gait training, Patient/Family education, Self Care, Joint mobilization, Dry Needling, Electrical stimulation, Cryotherapy, Moist heat, Taping, Ionotophoresis 4mg /ml Dexamethasone, and Manual therapy  PLAN FOR NEXT SESSION: HEP, STM, acute pain and inflammation treatment. **09/13/22- Print out treatment  prior to Dr appointment on 09/16/22. Also, try DN**   Marcelina Morel, DPT 09/17/2022, 12:55 PM

## 2022-09-18 ENCOUNTER — Ambulatory Visit (HOSPITAL_BASED_OUTPATIENT_CLINIC_OR_DEPARTMENT_OTHER): Payer: Medicare PPO | Attending: Pulmonary Disease | Admitting: Pulmonary Disease

## 2022-09-18 DIAGNOSIS — G4733 Obstructive sleep apnea (adult) (pediatric): Secondary | ICD-10-CM | POA: Insufficient documentation

## 2022-09-19 ENCOUNTER — Ambulatory Visit: Payer: Medicare PPO | Admitting: Physical Therapy

## 2022-09-19 ENCOUNTER — Encounter: Payer: Self-pay | Admitting: Cardiology

## 2022-09-19 ENCOUNTER — Ambulatory Visit: Payer: Medicare PPO | Attending: Cardiology | Admitting: Cardiology

## 2022-09-19 VITALS — BP 128/82 | HR 80 | Ht 66.0 in | Wt 200.0 lb

## 2022-09-19 DIAGNOSIS — Z8673 Personal history of transient ischemic attack (TIA), and cerebral infarction without residual deficits: Secondary | ICD-10-CM | POA: Diagnosis not present

## 2022-09-19 DIAGNOSIS — R600 Localized edema: Secondary | ICD-10-CM | POA: Diagnosis not present

## 2022-09-19 DIAGNOSIS — E782 Mixed hyperlipidemia: Secondary | ICD-10-CM

## 2022-09-19 DIAGNOSIS — Z953 Presence of xenogenic heart valve: Secondary | ICD-10-CM

## 2022-09-19 DIAGNOSIS — E1159 Type 2 diabetes mellitus with other circulatory complications: Secondary | ICD-10-CM

## 2022-09-19 DIAGNOSIS — T8203XD Leakage of heart valve prosthesis, subsequent encounter: Secondary | ICD-10-CM

## 2022-09-19 DIAGNOSIS — I10 Essential (primary) hypertension: Secondary | ICD-10-CM | POA: Diagnosis not present

## 2022-09-19 DIAGNOSIS — I5032 Chronic diastolic (congestive) heart failure: Secondary | ICD-10-CM

## 2022-09-19 DIAGNOSIS — M25611 Stiffness of right shoulder, not elsewhere classified: Secondary | ICD-10-CM

## 2022-09-19 DIAGNOSIS — M6281 Muscle weakness (generalized): Secondary | ICD-10-CM | POA: Diagnosis not present

## 2022-09-19 DIAGNOSIS — G4733 Obstructive sleep apnea (adult) (pediatric): Secondary | ICD-10-CM | POA: Diagnosis not present

## 2022-09-19 DIAGNOSIS — M5441 Lumbago with sciatica, right side: Secondary | ICD-10-CM | POA: Diagnosis not present

## 2022-09-19 DIAGNOSIS — T8203XA Leakage of heart valve prosthesis, initial encounter: Secondary | ICD-10-CM

## 2022-09-19 DIAGNOSIS — M25811 Other specified joint disorders, right shoulder: Secondary | ICD-10-CM | POA: Diagnosis not present

## 2022-09-19 DIAGNOSIS — M25612 Stiffness of left shoulder, not elsewhere classified: Secondary | ICD-10-CM | POA: Diagnosis not present

## 2022-09-19 DIAGNOSIS — Z96612 Presence of left artificial shoulder joint: Secondary | ICD-10-CM | POA: Diagnosis not present

## 2022-09-19 DIAGNOSIS — M25512 Pain in left shoulder: Secondary | ICD-10-CM | POA: Diagnosis not present

## 2022-09-19 DIAGNOSIS — I152 Hypertension secondary to endocrine disorders: Secondary | ICD-10-CM

## 2022-09-19 DIAGNOSIS — I351 Nonrheumatic aortic (valve) insufficiency: Secondary | ICD-10-CM

## 2022-09-19 DIAGNOSIS — M5442 Lumbago with sciatica, left side: Secondary | ICD-10-CM | POA: Diagnosis not present

## 2022-09-19 NOTE — Patient Instructions (Signed)
Medication Instructions:   Your physician recommends that you continue on your current medications as directed. Please refer to the Current Medication list given to you today.  *If you need a refill on your cardiac medications before your next appointment, please call your pharmacy*    Follow-Up: At [facility], you and your health needs are our priority.  As part of our continuing mission to provide you with exceptional heart care, we have created designated Provider Care Teams.  These Care Teams include your primary Cardiologist (physician) and Advanced Practice Providers (APPs -  Physician Assistants and Nurse Practitioners) who all work together to provide you with the care you need, when you need it.  We recommend signing up for the patient portal called "MyChart".  Sign up information is provided on this After Visit Summary.  MyChart is used to connect with patients for Virtual Visits (Telemedicine).  Patients are able to view lab/test results, encounter notes, upcoming appointments, etc.  Non-urgent messages can be sent to your provider as well.   To learn more about what you can do with MyChart, go to [email or web address].    Your next appointment:   1 year(s)  Provider:   Heather E Pemberton, MD      

## 2022-09-19 NOTE — Therapy (Signed)
OUTPATIENT PHYSICAL THERAPY SHOULDER TREATMENT   Patient Name: [name] MRN: [id number] DOB:1951-06-20, 72 y.o., female Today's Date: 09/19/2022  END OF SESSION:  PT End of Session - 09/19/22 1607     Visit Number 7    Date for PT Re-Evaluation 11/20/22    PT Start Time 1600    PT Stop Time 1650    PT Time Calculation (min) 50 min             Past Medical History:  Diagnosis Date   Allergy    Anxiety    Arthritis    back & knee   Asthma     mild per pt shows up with resp illness   Colon polyps    Constipation    Diabetes mellitus without complication (HCC)    Gallstones    Gastric polyps    Gastroparesis    GERD (gastroesophageal reflux disease)    Headache    sinus headaches and migraines at times   Heart murmur    History of migraine headaches    HTN (hypertension)    Hyperlipidemia    IBS (irritable bowel syndrome)    Joint pain    Lumbar disc disease    PONV (postoperative nausea and vomiting)    S/P aortic valve replacement with bioprosthetic valve 08/23/2016   25 mm Edwards Intuity rapid-deployment bovine pericardial tissue valve via partial upper mini sternotomy   Sleep apnea    does not use CPAP   TIA (transient ischemic attack)    TIA (transient ischemic attack)    hx of per pt   Past Surgical History:  Procedure Laterality Date   ABDOMINAL HYSTERECTOMY     AORTIC VALVE REPLACEMENT N/A 08/23/2016   Procedure: AORTIC VALVE REPLACEMENT (AVR) - using partial Upper Sternotomy- 11mm Edwards Intuity Aortic Valve used;  Surgeon: Rexene Alberts, MD;  Location: [place];  Service: Open Heart Surgery;  Laterality: N/A;   BLADDER SUSPENSION  2007   BREAST ENHANCEMENT SURGERY  1980   CARPAL TUNNEL RELEASE Bilateral    COLONOSCOPY     DORSAL COMPARTMENT RELEASE Right 09/15/2014   Procedure: RIGHT WRIST DEQUERVAINS RELEASE ;  Surgeon: Kathryne Hitch, MD;  Location: [place];  Service: Orthopedics;  Laterality: Right;   DRUG INDUCED  ENDOSCOPY N/A 02/19/2022   Procedure: DRUG INDUCED SLEEP ENDOSCOPY;  Surgeon: Jerrell Belfast, MD;  Location: [place];  Service: ENT;  Laterality: N/A;   ESOPHAGOGASTRODUODENOSCOPY     EYE SURGERY     cataract surgery   IMPLANTATION OF HYPOGLOSSAL NERVE STIMULATOR Right 04/23/2022   Procedure: IMPLANTATION OF HYPOGLOSSAL NERVE STIMULATOR;  Surgeon: Jerrell Belfast, MD;  Location: [place];  Service: ENT;  Laterality: Right;   KNEE ARTHROSCOPY  04/15/2012   Procedure: ARTHROSCOPY KNEE;  Surgeon: [name], MD;  Location: [place];  Service: Orthopedics;  Laterality: Right;   LAPAROSCOPIC CHOLECYSTECTOMY     LASIK     OOPHORECTOMY     PALATE TO GINGIVA GRAFT  2017   REVERSE SHOULDER ARTHROPLASTY Left 02/13/2021   Procedure: REVERSE SHOULDER ARTHROPLASTY;  Surgeon: Marchia Bond, MD;  Location: [facility];  Service: Orthopedics;  Laterality: Left;   RIGHT/LEFT HEART CATH AND CORONARY ANGIOGRAPHY N/A 08/02/2016   Procedure: Right/Left Heart Cath and Coronary Angiography;  Surgeon: Sherren Mocha, MD;  Location: [place] [facility];  Service: Cardiovascular;  Laterality: N/A;   ROTATOR CUFF REPAIR Left    TEE WITHOUT CARDIOVERSION N/A 08/23/2016  Procedure: TRANSESOPHAGEAL ECHOCARDIOGRAM (TEE);  Surgeon: Rexene Alberts, MD;  Location: [place];  Service: Open Heart Surgery;  Laterality: N/A;   TOTAL KNEE ARTHROPLASTY  04/15/2012   Procedure: TOTAL KNEE ARTHROPLASTY;  Surgeon: [name], MD;  Location: [place];  Service: Orthopedics;  Laterality: Right;   TRIGGER FINGER RELEASE Right 04/20/2015   Procedure: RIGHT TRIGGER FINGER RELEASE (TENDON SHEALTH INCISION) ;  Surgeon: [name], MD;  Location: [place];  Service: Orthopedics;  Laterality: Right;   Patient Active Problem List   Diagnosis Date Noted   Class 2 severe obesity with serious comorbidity and body mass index (BMI) of 36.0 to 36.9 in adult [facility]) 05/09/2022   Type 2 diabetes  mellitus with obesity ([place]) 05/09/2022   Hypertension associated with type 2 diabetes mellitus ([place]) 05/09/2022   SOB (shortness of breath) 12/10/2021   S/P reverse total shoulder arthroplasty, left 02/13/2021   Idiopathic hyperphosphatasia 08/03/2019   Vitamin D deficiency 06/04/2018   Hyponatremia 02/17/2018   History of CVA in adulthood 08/13/2017   Blurry vision 08/13/2017   Primary hypertension 01/02/2017   S/P minimally invasive aortic valve replacement with bioprosthetic valve [id number]   Diastolic dysfunction    Osteopenia 05/27/2016   OSA (obstructive sleep apnea) 02/27/2016   Bruxism 10/23/2015   Elevated alkaline phosphatase level 05/16/2015   Atypical chest pain 11/10/2014   Acne 05/12/2014   Diabetes mellitus ([place]) 05/12/2014   GERD (gastroesophageal reflux disease) 03/10/2014   Generalized anxiety disorder 03/10/2014   Migraines 03/10/2014   EUSTACHIAN TUBE DYSFUNCTION, CHRONIC 06/14/2010   Irritable bowel syndrome 12/15/2008   Low back pain 08/11/2008   Hyperlipidemia associated with type 2 diabetes mellitus ([place]) 02/09/2008   Allergic rhinitis 02/12/2007   Asthma 02/12/2007    PCP: Garret Reddish O,MD  REFERRING PROVIDER: Marchia Bond, MD  REFERRING DIAG: Rt Shoulder impingement   THERAPY DIAG:  Stiffness of right shoulder, not elsewhere classified  Impingement of right shoulder  Muscle weakness (generalized)  Rationale for Evaluation and Treatment: Rehabilitation  ONSET DATE: 08/19/22  SUBJECTIVE:                                                                                                                                                                                      SUBJECTIVE STATEMENT: "About the same" pain varies with mvmt  PERTINENT HISTORY: L TSR 2022, R TKR 2013, heart valve replacement, just returned to Dr with good report.  PAIN:  Are you having pain? Yes: NPRS scale: 5/10 Pain location: R shoulder and proximal arm Pain  description: sharp Aggravating factors: Moves certain ways Relieving factors: Not using her arm, don't sleep on  R.  PRECAUTIONS: None  WEIGHT BEARING RESTRICTIONS: No  FALLS:  Has patient fallen in last 6 months? No  LIVING ENVIRONMENT: Lives with: lives with their spouse Lives in: House/apartment Stairs: Yes: External: 5 steps; on left going up Has following equipment at home: None  OCCUPATION: Retired. Trying to lose weight, so wants/needs to exercise more, but the shoulder pain limits her ability to exercise.  PLOF: Independent  PATIENT GOALS:Decrease pain. Return to using her R arm wihtout pain.  NEXT MD VISIT: 09/16/22  OBJECTIVE:   DIAGNOSTIC FINDINGS:  X ray 2/27/24showed preservation of the bony architecure of the R shoulder   SURVEYS:  FOTO 48  COGNITION: Overall cognitive status: Within functional limits for tasks assessed     SENSATION: WFL  POSTURE: Slightly forward head, R shoulder rounded forward and elevated, flattened spinal curves, mildly winging scapulae  UPPER EXTREMITY ROM: L WFL, Active elevation mildly limited. Elbow and wrist WFL B.  Active ROM Right eval R 09/13/22  09/19/22 standing   Shoulder flexion 102 139 153  Shoulder extension 102 102 130  Shoulder abduction     Shoulder adduction     Shoulder internal rotation WNL 60 65  Shoulder external rotation 32 75 75  Elbow flexion     Elbow extension     Wrist flexion     Wrist extension     Wrist ulnar deviation     Wrist radial deviation     Wrist pronation     Wrist supination     (Blank rows = not tested)  UPPER EXTREMITY MMT:  MMT Right eval Left eval RT 09/19/22  Shoulder flexion   4/5  Shoulder extension     Shoulder abduction   4-/5 with pain  Shoulder adduction     Shoulder internal rotation   4/5  Shoulder external rotation   4/5 with pain  Middle trapezius     Lower trapezius     Elbow flexion     Elbow extension     Wrist flexion     Wrist extension      Wrist ulnar deviation     Wrist radial deviation     Wrist pronation     Wrist supination     Grip strength (lbs)     (Blank rows = not tested)  SHOULDER SPECIAL TESTS: Impingement tests: Hawkins/Kennedy impingement test: positive  and Painful arc test: positive  Rotator cuff assessment: Drop arm test: positive , Empty can test: positive , Full can test: positive , and Gerber lift off test: negative Biceps assessment: Speed's test: positive   JOINT MOBILITY TESTING:  R humeral glide limited, most in post glide, also inferior glide.  PALPATION:  TTP R cerv paraspinals, up traps, medial scapular border. Tightness noted in R cerv paraspinals, scalenes, UT, LS, pects. TTP supraspinatus tendon and long head of biceps tendon, R.   TODAY'S TREATMENT:  DATE:   09/19/22 Nustep L 5 7 min 2# upper cut 2 sets 10 2# hitch hiker 2 sets 10 2# empty can 2 sets 10 Rows and Ext red 2x10 RT Triceps Ext 15lb 2x15 UE ER/IR yellow Tband 2x10 Each ROM and MMT- see above SL abd and ER 2# 10 x 2 sets Supine 2# flex and chest press 2 sets 10 PROM for R shoulder flex abd, ER CP to R shoulder x 10 minutes for inflammation.      09/17/22 NuStep L5 x 6 min RUE ER/IR yellow Tband 2x10 Each Rows and Ext red 2x10 Triceps Ext 15lb 2x15 AAROM Flex, Ext, IR up back 2lb WaTE Flex, abd, circles on wall RUE x10 each  Yellow band 3 way RUE 2x5 each PROM for R shoulder flex abd, ER CP to R shoulder x 10 minutes for inflammation.    09/13/22 AAROM Flex, Ext, IR up back  Biceps curls 2lb dumbbells x10 Triceps Ext 15lb 2x10 NuStep L5 x 6 min RUE ER/IR yellow Tband 2x10 Each PROM for R shoulder flex abd, ER CP to R shoulder x 10 minutes for inflammation.      09/11/22 NuStep L 4 x 6 min AAROM Flex, Ext, IR up back  Seated Rows yellow 2x10 Shoulder Ext Yellow  2x10 RUE ER/IR yellow, very weak with ER Triceps Ext 15lb 2x10, biceps curls 5lb x10 Biceps curls 2lb dumbbells x10 Pendulum exercises with 3# weight, deep breathing to increase stretch STM to cervical muscles and up traps, rhomboids  PROM for R shoulder flex abd, ER CP to R shoulder x 10 minutes for inflammation.   09/06/22 NuStep L4 x 6 minutes STM to cervical muscles and up traps, rhomboids on R, pects. Joint mobs, humeral depression and post glide, grade lll, 2 x 10 reps each. PROM for R shoulder flex abd, ER AAROM with 2# dowel, shoulder flex, abd, ext 10 reps each. Wall stars with yellow Tband, 2 x 5 reps each side Attempted scap protraction against G Tband, but patient had difficulty activating the motion. Ball on wall pushes in diamond pattern 10 rounds. Reported some lateral/distal shoulder soreness after this activity. Pendulum exercises with 3# weight, deep breathing to increase stretch CP to R shoulder x 10 minutes for inflammation.    09/04/22 Attempted UBE, but too painful at this point [place] to R shoulder x 8 minutes STM and stretch to R up traps, pects, scalenes. Joint mobs, R humeral inf and post glide, grade lll, 2 x 10 reps PROM R shoulder flex, abd, IR/ER Seated posterior capsule stretch, seated humeral inf glide with hand on mat at 60 degrees Attempted doorway stretch and rotation while holding the frame to stretch pects, but caused too much pain Wall slides into flex and scaption, staying below 90 degrees of elevation to manage pain, 10 Standing shoulder ext, rows, ER against G tband for scapular stability. CP to R shoulder x 10 minutes.  08/28/22 Education  PATIENT EDUCATION: Education details: POC Person educated: Patient Education method: Customer service manager Education comprehension: verbalized understanding  HOME EXERCISE PROGRAM: [id number]  ASSESSMENT:  CLINICAL IMPRESSION: Pt tolerated a progression of stretching and strengthening  activities today. RUE ER was very weak and did cause some pain. ROM interventions against wall were performed with pillow case. Pain reported with flexion and abduction against wall did cause some pain. Constant cues needed to relax with PROM. Pain reported with passive ER. finished with CP for pain relief. Pt stated that she is waiting  on MD to decide bette MRI or CAT scan due to having and inspire implant.  OBJECTIVE IMPAIRMENTS: decreased activity tolerance, decreased coordination, decreased ROM, decreased strength, impaired flexibility, impaired UE functional use, improper body mechanics, postural dysfunction, and pain.   ACTIVITY LIMITATIONS: carrying, lifting, sleeping, reach over head, and hygiene/grooming  PARTICIPATION LIMITATIONS: cleaning, laundry, community activity, and yard work  PERSONAL FACTORS: Past/current experiences are also affecting patient's functional outcome.   REHAB POTENTIAL: Good  CLINICAL DECISION MAKING: Stable/uncomplicated  EVALUATION COMPLEXITY: low   GOALS: Goals reviewed with patient? Yes  SHORT TERM GOALS: Target date: 09/09/22  I with initial HEP Baseline: Goal status:09/04/22-Initial program provided.  09/19/22 MET  LONG TERM GOALS: Target date: 11/20/22  I with final HEP Baseline:  Goal status: INITIAL  2.  Patient will perform AROM of R shoulder at least 120 elevation, with<3/10 pain. Baseline: 102, with increased pain Goal status: PROM WNL, partly met with flexion 09/17/22 but still having pain with active elevation  3.  Increase R shoulder strength to at least 4-/5 Baseline: Unable to tolerate resistance due to pain Goal status: 3/26\/24-ongoing  4.  Increase FOTO score to at least 61 Baseline: 48 Goal status: INITIAL  5.  Patient will be able to manage styling her hair and putting on her bra with R shoulder pain < 3/10 Baseline: 8/10 Goal status: Progressing 6/10  PLAN:  PT FREQUENCY: 1-2x/week  PT DURATION: 12 weeks  PLANNED  INTERVENTIONS: Therapeutic exercises, Therapeutic activity, Neuromuscular re-education, Balance training, Gait training, Patient/Family education, Self Care, Joint mobilization, Dry Needling, Electrical stimulation, Cryotherapy, Moist heat, Taping, Ionotophoresis 4mg /ml Dexamethasone, and Manual therapy  PLAN FOR NEXT SESSION: progress strength and ROM. Awaiting cat scan Patient Details  Name: [name] MRN: [id number] Date of Birth: 1951-06-12 Referring Provider:  [name], MD  Encounter Date: 09/19/2022 Laqueta Carina, PTA 09/19/2022, 4:08 PM  [facility]. [place], [place], [address] Phone: [phone]   Fax:  [phone]

## 2022-09-23 ENCOUNTER — Other Ambulatory Visit: Payer: Self-pay | Admitting: Orthopedic Surgery

## 2022-09-23 ENCOUNTER — Ambulatory Visit: Payer: Medicare PPO | Admitting: Physical Therapy

## 2022-09-23 DIAGNOSIS — G4733 Obstructive sleep apnea (adult) (pediatric): Secondary | ICD-10-CM

## 2022-09-23 DIAGNOSIS — M75100 Unspecified rotator cuff tear or rupture of unspecified shoulder, not specified as traumatic: Secondary | ICD-10-CM

## 2022-09-24 NOTE — Procedures (Signed)
Patient Name: [name], [name] Date: 09/18/2022 Gender: Female D.O.B: 07-31-1950 Age (years): 41 Referring Provider: Kara Mead MD, ABSM Height (inches): 66 Interpreting Physician: Kara Mead MD, ABSM Weight (lbs): 193 RPSGT: Carolin Coy BMI: 31 MRN: [id number] Neck Size: 15.50 <br> <br> CLINICAL INFORMATION The patient is referred for a CPAP titration to treat sleep apnea.    HST 09/2021 showed moderate sleep apnea with AHI at 16.5/hour and SPO2 low at 77%  SLEEP STUDY TECHNIQUE As per the AASM Manual for the Scoring of Sleep and Associated Events v2.3 (April 2016) with a hypopnea requiring 4% desaturations.  The channels recorded and monitored were frontal, central and occipital EEG, electrooculogram (EOG), submentalis EMG (chin), nasal and oral airflow, thoracic and abdominal wall motion, anterior tibialis EMG, snore microphone, electrocardiogram, and pulse oximetry. Continuous positive airway pressure (CPAP) was initiated at the beginning of the study and titrated to treat sleep-disordered breathing.  MEDICATIONS Medications self-administered by patient taken the night of the study : Aleve pm  TECHNICIAN COMMENTS Comments added by technician: PATIENT WAS ORDERED AS A INSPIRE TITRATION  RESPIRATORY PARAMETERS Optimal PAP Pressure (cm):  AHI at Optimal Pressure (/hr): N/A Overall Minimal O2 (%): 80.0 Supine % at Optimal Pressure (%): N/A Minimal O2 at Optimal Pressure (%): 80.0   SLEEP ARCHITECTURE The study was initiated at 10:22:15 PM and ended at 5:31:26 AM.  Sleep onset time was 61.1 minutes and the sleep efficiency was 63.5%. The total sleep time was 272.5 minutes.  The patient spent 15.4% of the night in stage N1 sleep, 70.8% in stage N2 sleep, 0.0% in stage N3 and 13.8% in REM.Stage REM latency was 321.0 minutes  Wake after sleep onset was 95.6. Alpha intrusion was absent. Supine sleep was 88.44%.  CARDIAC DATA The 2 lead EKG demonstrated sinus  rhythm. The mean heart rate was 59.1 beats per minute. Other EKG findings include: PVCs.   LEG MOVEMENT DATA The total Periodic Limb Movements of Sleep (PLMS) were 0. The PLMS index was 0.0. A PLMS index of <15 is considered normal in adults.  IMPRESSIONS - An optimal voltage could not be selected for this patient based on the available study data.She was at 1.8V at entry.  Voltage was titrateyd from 1.6V to 2.6V. Hypopneas persisted at the final level of 2.6 V. - Severe oxygen desaturations were observed during this titration (min O2 = 80.0%). - The patient snored with moderate snoring volume during this titration study. - 2-lead EKG demonstrated: PVCs - Clinically significant periodic limb movements were not noted during this study. Arousals associated with PLMs were rare.   DIAGNOSIS - Obstructive Sleep Apnea (G47.33) - Bruxism (G47.63)   RECOMMENDATIONS - Further titration of voltage to obliterate hypopneas .This can be doe clinically followed by a repeat titration study to confirm - Consider an oral guard for Bruxism. - Avoid alcohol, sedatives and other CNS depressants that may worsen sleep apnea and disrupt normal sleep architecture. - Sleep hygiene should be reviewed to assess factors that may improve sleep quality. - Weight management and regular exercise should be initiated or continued. - Return to [facility] Center for re-evaluation after 4 weeks of therapy   Kara Mead MD Board Certified in [place]

## 2022-09-25 ENCOUNTER — Encounter: Payer: Self-pay | Admitting: Physical Therapy

## 2022-09-25 ENCOUNTER — Ambulatory Visit: Payer: Medicare PPO | Attending: Orthopedic Surgery | Admitting: Physical Therapy

## 2022-09-25 DIAGNOSIS — M25612 Stiffness of left shoulder, not elsewhere classified: Secondary | ICD-10-CM | POA: Insufficient documentation

## 2022-09-25 DIAGNOSIS — M25611 Stiffness of right shoulder, not elsewhere classified: Secondary | ICD-10-CM | POA: Insufficient documentation

## 2022-09-25 DIAGNOSIS — M6281 Muscle weakness (generalized): Secondary | ICD-10-CM | POA: Diagnosis not present

## 2022-09-25 DIAGNOSIS — M25811 Other specified joint disorders, right shoulder: Secondary | ICD-10-CM

## 2022-09-25 NOTE — Therapy (Signed)
OUTPATIENT PHYSICAL THERAPY SHOULDER TREATMENT   Patient Name: [name] MRN: [id number] DOB:Nov 17, 1950, 72 y.o., female Today's Date: 09/25/2022  END OF SESSION:  PT End of Session - 09/25/22 1506     Visit Number 8    Date for PT Re-Evaluation 11/20/22    PT Start Time 1500    PT Stop Time 1540    PT Time Calculation (min) 40 min    Activity Tolerance Patient limited by pain    Behavior During Therapy [facility] for tasks assessed/performed             Past Medical History:  Diagnosis Date   Allergy    Anxiety    Arthritis    back & knee   Asthma     mild per pt shows up with resp illness   Colon polyps    Constipation    Diabetes mellitus without complication    Gallstones    Gastric polyps    Gastroparesis    GERD (gastroesophageal reflux disease)    Headache    sinus headaches and migraines at times   Heart murmur    History of migraine headaches    HTN (hypertension)    Hyperlipidemia    IBS (irritable bowel syndrome)    Joint pain    Lumbar disc disease    PONV (postoperative nausea and vomiting)    S/P aortic valve replacement with bioprosthetic valve 08/23/2016   25 mm Edwards Intuity rapid-deployment bovine pericardial tissue valve via partial upper mini sternotomy   Sleep apnea    does not use CPAP   TIA (transient ischemic attack)    TIA (transient ischemic attack)    hx of per pt   Past Surgical History:  Procedure Laterality Date   ABDOMINAL HYSTERECTOMY     AORTIC VALVE REPLACEMENT N/A 08/23/2016   Procedure: AORTIC VALVE REPLACEMENT (AVR) - using partial Upper Sternotomy- 42mm Edwards Intuity Aortic Valve used;  Surgeon: Rexene Alberts, MD;  Location: [place];  Service: Open Heart Surgery;  Laterality: N/A;   BLADDER SUSPENSION  2007   BREAST ENHANCEMENT SURGERY  1980   CARPAL TUNNEL RELEASE Bilateral    COLONOSCOPY     DORSAL COMPARTMENT RELEASE Right 09/15/2014   Procedure: RIGHT WRIST DEQUERVAINS RELEASE ;  Surgeon: Kathryne Hitch, MD;   Location: [place];  Service: Orthopedics;  Laterality: Right;   DRUG INDUCED ENDOSCOPY N/A 02/19/2022   Procedure: DRUG INDUCED SLEEP ENDOSCOPY;  Surgeon: Jerrell Belfast, MD;  Location: [place];  Service: ENT;  Laterality: N/A;   ESOPHAGOGASTRODUODENOSCOPY     EYE SURGERY     cataract surgery   IMPLANTATION OF HYPOGLOSSAL NERVE STIMULATOR Right 04/23/2022   Procedure: IMPLANTATION OF HYPOGLOSSAL NERVE STIMULATOR;  Surgeon: Jerrell Belfast, MD;  Location: [place];  Service: ENT;  Laterality: Right;   KNEE ARTHROSCOPY  04/15/2012   Procedure: ARTHROSCOPY KNEE;  Surgeon: [name], MD;  Location: [place];  Service: Orthopedics;  Laterality: Right;   LAPAROSCOPIC CHOLECYSTECTOMY     LASIK     OOPHORECTOMY     PALATE TO GINGIVA GRAFT  2017   REVERSE SHOULDER ARTHROPLASTY Left 02/13/2021   Procedure: REVERSE SHOULDER ARTHROPLASTY;  Surgeon: Marchia Bond, MD;  Location: [facility];  Service: Orthopedics;  Laterality: Left;   RIGHT/LEFT HEART CATH AND CORONARY ANGIOGRAPHY N/A 08/02/2016   Procedure: Right/Left Heart Cath and Coronary Angiography;  Surgeon: Sherren Mocha, MD;  Location: [place] [facility];  Service: Cardiovascular;  Laterality: N/A;   ROTATOR CUFF REPAIR Left    TEE WITHOUT CARDIOVERSION N/A 08/23/2016   Procedure: TRANSESOPHAGEAL ECHOCARDIOGRAM (TEE);  Surgeon: Rexene Alberts, MD;  Location: [place];  Service: Open Heart Surgery;  Laterality: N/A;   TOTAL KNEE ARTHROPLASTY  04/15/2012   Procedure: TOTAL KNEE ARTHROPLASTY;  Surgeon: [name], MD;  Location: [place];  Service: Orthopedics;  Laterality: Right;   TRIGGER FINGER RELEASE Right 04/20/2015   Procedure: RIGHT TRIGGER FINGER RELEASE (TENDON SHEALTH INCISION) ;  Surgeon: [name], MD;  Location: [place];  Service: Orthopedics;  Laterality: Right;   Patient Active Problem List   Diagnosis Date Noted   Class 2 severe obesity with serious  comorbidity and body mass index (BMI) of 36.0 to 36.9 in adult 05/09/2022   Type 2 diabetes mellitus with obesity 05/09/2022   Hypertension associated with type 2 diabetes mellitus 05/09/2022   SOB (shortness of breath) 12/10/2021   S/P reverse total shoulder arthroplasty, left 02/13/2021   Idiopathic hyperphosphatasia 08/03/2019   Vitamin D deficiency 06/04/2018   Hyponatremia 02/17/2018   History of CVA in adulthood 08/13/2017   Blurry vision 08/13/2017   Primary hypertension 01/02/2017   S/P minimally invasive aortic valve replacement with bioprosthetic valve [id number]   Diastolic dysfunction    Osteopenia 05/27/2016   OSA (obstructive sleep apnea) 02/27/2016   Bruxism 10/23/2015   Elevated alkaline phosphatase level 05/16/2015   Atypical chest pain 11/10/2014   Acne 05/12/2014   Diabetes mellitus 05/12/2014   GERD (gastroesophageal reflux disease) 03/10/2014   Generalized anxiety disorder 03/10/2014   Migraines 03/10/2014   EUSTACHIAN TUBE DYSFUNCTION, CHRONIC 06/14/2010   Irritable bowel syndrome 12/15/2008   Low back pain 08/11/2008   Hyperlipidemia associated with type 2 diabetes mellitus 02/09/2008   Allergic rhinitis 02/12/2007   Asthma 02/12/2007    PCP: Garret Reddish O,MD  REFERRING PROVIDER: Marchia Bond, MD  REFERRING DIAG: Rt Shoulder impingement   THERAPY DIAG:  Stiffness of right shoulder, not elsewhere classified  Impingement of right shoulder  Muscle weakness (generalized)  Rationale for Evaluation and Treatment: Rehabilitation  ONSET DATE: 08/19/22  SUBJECTIVE:                                                                                                                                                                                      SUBJECTIVE STATEMENT: "About the same" pain varies with mvmt  PERTINENT HISTORY: L TSR 2022, R TKR 2013, heart valve replacement, just returned to Dr with good report.  PAIN:  Are you having pain? Yes:  NPRS scale: 5/10 Pain location: R shoulder and proximal arm Pain description: sharp Aggravating  factors: Moves certain ways Relieving factors: Not using her arm, don't sleep on R.  PRECAUTIONS: None  WEIGHT BEARING RESTRICTIONS: No  FALLS:  Has patient fallen in last 6 months? No  LIVING ENVIRONMENT: Lives with: lives with their spouse Lives in: House/apartment Stairs: Yes: External: 5 steps; on left going up Has following equipment at home: None  OCCUPATION: Retired. Trying to lose weight, so wants/needs to exercise more, but the shoulder pain limits her ability to exercise.  PLOF: Independent  PATIENT GOALS:Decrease pain. Return to using her R arm wihtout pain.  NEXT MD VISIT: 09/16/22  OBJECTIVE:   DIAGNOSTIC FINDINGS:  X ray 2/27/24showed preservation of the bony architecure of the R shoulder   SURVEYS:  FOTO 48  COGNITION: Overall cognitive status: Within functional limits for tasks assessed     SENSATION: WFL  POSTURE: Slightly forward head, R shoulder rounded forward and elevated, flattened spinal curves, mildly winging scapulae  UPPER EXTREMITY ROM: L WFL, Active elevation mildly limited. Elbow and wrist WFL B.  Active ROM Right eval R 09/13/22  09/19/22 standing   Shoulder flexion 102 139 153  Shoulder extension 102 102 130  Shoulder abduction     Shoulder adduction     Shoulder internal rotation WNL 60 65  Shoulder external rotation 32 75 75  Elbow flexion     Elbow extension     Wrist flexion     Wrist extension     Wrist ulnar deviation     Wrist radial deviation     Wrist pronation     Wrist supination     (Blank rows = not tested)  UPPER EXTREMITY MMT:  MMT Right eval Left eval RT 09/19/22  Shoulder flexion   4/5  Shoulder extension     Shoulder abduction   4-/5 with pain  Shoulder adduction     Shoulder internal rotation   4/5  Shoulder external rotation   4/5 with pain  Middle trapezius     Lower trapezius     Elbow flexion      Elbow extension     Wrist flexion     Wrist extension     Wrist ulnar deviation     Wrist radial deviation     Wrist pronation     Wrist supination     Grip strength (lbs)     (Blank rows = not tested)  SHOULDER SPECIAL TESTS: Impingement tests: Hawkins/Kennedy impingement test: positive  and Painful arc test: positive  Rotator cuff assessment: Drop arm test: positive , Empty can test: positive , Full can test: positive , and Gerber lift off test: negative Biceps assessment: Speed's test: positive   JOINT MOBILITY TESTING:  R humeral glide limited, most in post glide, also inferior glide.  PALPATION:  TTP R cerv paraspinals, up traps, medial scapular border. Tightness noted in R cerv paraspinals, scalenes, UT, LS, pects. TTP supraspinatus tendon and long head of biceps tendon, R.   TODAY'S TREATMENT:  DATE:  09/25/22 NuStep L5 x 6 minutes STM- supraspinatus tendon, bicep tendon Joint mobs-humeral glides, inferior, post, ant, scapular mobs, grade lll2 x 10 each Stretch into shoulder flex, abd, ER Nerve glides in stand, hand on wall and rotate away. Standing shoulder ext, rows, ER with g tband, 2 x 10 reps Wall slides, keeping shoulde < 90 flexion, 12 reps CP to R shoulder x 10 minutes for inflammation.  09/19/22 Nustep L 5 7 min 2# upper cut 2 sets 10 2# hitch hiker 2 sets 10 2# empty can 2 sets 10 Rows and Ext red 2x10 RT Triceps Ext 15lb 2x15 UE ER/IR yellow Tband 2x10 Each ROM and MMT- see above SL abd and ER 2# 10 x 2 sets Supine 2# flex and chest press 2 sets 10 PROM for R shoulder flex abd, ER CP to R shoulder x 10 minutes for inflammation.   09/17/22 NuStep L5 x 6 min RUE ER/IR yellow Tband 2x10 Each Rows and Ext red 2x10 Triceps Ext 15lb 2x15 AAROM Flex, Ext, IR up back 2lb WaTE Flex, abd, circles on wall RUE x10 each   Yellow band 3 way RUE 2x5 each PROM for R shoulder flex abd, ER CP to R shoulder x 10 minutes for inflammation.    09/13/22 AAROM Flex, Ext, IR up back  Biceps curls 2lb dumbbells x10 Triceps Ext 15lb 2x10 NuStep L5 x 6 min RUE ER/IR yellow Tband 2x10 Each PROM for R shoulder flex abd, ER CP to R shoulder x 10 minutes for inflammation.      09/11/22 NuStep L 4 x 6 min AAROM Flex, Ext, IR up back  Seated Rows yellow 2x10 Shoulder Ext Yellow 2x10 RUE ER/IR yellow, very weak with ER Triceps Ext 15lb 2x10, biceps curls 5lb x10 Biceps curls 2lb dumbbells x10 Pendulum exercises with 3# weight, deep breathing to increase stretch STM to cervical muscles and up traps, rhomboids  PROM for R shoulder flex abd, ER CP to R shoulder x 10 minutes for inflammation.   PATIENT EDUCATION: Education details: POC Person educated: Patient Education method: Customer service manager Education comprehension: verbalized understanding  HOME EXERCISE PROGRAM: [id number]  ASSESSMENT:  CLINICAL IMPRESSION: Pt reports that the Dr ordered CT scan, has not yet been scheduled. She reports continued pain in R upper arm with elevation of shoulder. Performed STM, joint mobs, stretching to R shoulder F/B nerve glides and strengthening exercises. Finished with CP to relieve inflammation from treatment.  OBJECTIVE IMPAIRMENTS: decreased activity tolerance, decreased coordination, decreased ROM, decreased strength, impaired flexibility, impaired UE functional use, improper body mechanics, postural dysfunction, and pain.   ACTIVITY LIMITATIONS: carrying, lifting, sleeping, reach over head, and hygiene/grooming  PARTICIPATION LIMITATIONS: cleaning, laundry, community activity, and yard work  PERSONAL FACTORS: Past/current experiences are also affecting patient's functional outcome.   REHAB POTENTIAL: Good  CLINICAL DECISION MAKING: Stable/uncomplicated  EVALUATION COMPLEXITY: low   GOALS: Goals  reviewed with patient? Yes  SHORT TERM GOALS: Target date: 09/09/22  I with initial HEP Baseline: Goal status:09/04/22-Initial program provided.  09/19/22 MET  LONG TERM GOALS: Target date: 11/20/22  I with final HEP Baseline:  Goal status: INITIAL  2.  Patient will perform AROM of R shoulder at least 120 elevation, with<3/10 pain. Baseline: 102, with increased pain Goal status: PROM WNL, partly met with flexion 09/25/22 but still having pain with active elevation  3.  Increase R shoulder strength to at least 4-/5 Baseline: Unable to tolerate resistance due to pain Goal status: 3/26\/24-ongoing  4.  Increase FOTO score to at least 61 Baseline: 48 Goal status: INITIAL  5.  Patient will be able to manage styling her hair and putting on her bra with R shoulder pain < 3/10 Baseline: 8/10 Goal status: Progressing 6/10  PLAN:  PT FREQUENCY: 1-2x/week  PT DURATION: 12 weeks  PLANNED INTERVENTIONS: Therapeutic exercises, Therapeutic activity, Neuromuscular re-education, Balance training, Gait training, Patient/Family education, Self Care, Joint mobilization, Dry Needling, Electrical stimulation, Cryotherapy, Moist heat, Taping, Ionotophoresis 4mg /ml Dexamethasone, and Manual therapy  PLAN FOR NEXT SESSION: progress strength and ROM. Awaiting cat scan  Patient Details  Name: [name] MRN: [id number] Date of Birth: 1951-03-15 Referring Provider:  [name], MD  Encounter Date: 09/25/2022 Marcelina Morel, DPT 09/25/2022, 3:38 PM  [facility] [place]. [place], [place], [address] Phone: [phone]   Fax:  [phone]

## 2022-10-02 ENCOUNTER — Ambulatory Visit (HOSPITAL_BASED_OUTPATIENT_CLINIC_OR_DEPARTMENT_OTHER): Payer: Medicare PPO | Admitting: Pulmonary Disease

## 2022-10-02 ENCOUNTER — Encounter (HOSPITAL_BASED_OUTPATIENT_CLINIC_OR_DEPARTMENT_OTHER): Payer: Self-pay | Admitting: Pulmonary Disease

## 2022-10-02 VITALS — BP 140/60 | HR 70 | Temp 99.3°F | Ht 66.0 in | Wt 199.2 lb

## 2022-10-02 DIAGNOSIS — G4733 Obstructive sleep apnea (adult) (pediatric): Secondary | ICD-10-CM

## 2022-10-02 NOTE — Assessment & Plan Note (Signed)
Hypoglossal nerve stimulator was reassessed today.  Goal of the follow-up visit was to ensure good compliance, good subjective benefit, good tongue motion and good sense lead waveforms .incision sites appear good, tongue protrusion was examined.  Download was reviewed and usage appears to be up to 12 hours average per night.  Spouse reports that snoring is decreased and she has more energy.  She denies any discomfort.  She is at level 4 = 1.9V.  During sleep study old-age was titrated from 1.6 to 2.6 V and residual hypopneas were noted.  I suspect overstimulation based on exam today.  She has good tongue protrusion on even lower voltages. Programming :  1.  Stimulation level : Sensation 1.2 V , we changed her to a level 3= 1.4 V, change maximum 26 V 2. Start delay 30 minutes, pause time 15 minutes, duration 12 hours 3.  Sensing waveform was analyzed for 3 minutes.  We also checked impedance on electrode configuration B and C in case that is required 4.  Sleep remote education was provided patient demonstrated competency with the remote and was aware of patient Instruction videos and sleep remote guide 5.  Patient was instructed to use her device all night every night 6.   Home sleep test will be scheduled 1.4 V.  If hypopneas are controlled and we will continue her at this level otherwise we will have to go down yellow pathway and change her code configuration Download was reviewed to confirm good compliance with the device

## 2022-10-02 NOTE — Patient Instructions (Signed)
We reset our device to level 4 =1.4 V  Schedule home sleep test / watchpat

## 2022-10-02 NOTE — Progress Notes (Signed)
   Subjective:    Patient ID: [name], female    DOB: [birth year], 72 y.o.   MRN: [id number]  HPI  72 year old for follow-up of OSA   PMH -  hypertension, diabetes, stroke, diastolic dysfunction and aortic valve replacement, SIADH on demeclocycline   She was unable to tolerate CPAP and underwent implantation of hypoglossal nerve stimulator by Dr. Annalee Genta on 04/23/22.  07/02/22 Device activated   She arrives accompanied by her husband today.  She is resting well and denies daytime somnolence and fatigue.  Husband does not report increased snoring We reviewed titration study in detail We also interrogated device and check programming on different electrodes  Significant tests/ events reviewed  Inspire titration 08/2022 She was at 1.8V at entry. Voltage was titrated from 1.6V to 2.6V. Hypopneas persisted at the final level of 2.6 V.  Low sat of 80%  Home sleep study July 2017 AHI 19/hr    HST 09/2021 showed moderate sleep apnea with AHI at 16.5/hour and SPO2 low at 77%  Review of Systems neg for any significant sore throat, dysphagia, itching, sneezing, nasal congestion or excess/ purulent secretions, fever, chills, sweats, unintended wt loss, pleuritic or exertional cp, hempoptysis, orthopnea pnd or change in chronic leg swelling. Also denies presyncope, palpitations, heartburn, abdominal pain, nausea, vomiting, diarrhea or change in bowel or urinary habits, dysuria,hematuria, rash, arthralgias, visual complaints, headache, numbness weakness or ataxia.     Objective:   Physical Exam  Gen. Pleasant, obese, in no distress ENT - no lesions, no post nasal drip Neck: No JVD, no thyromegaly, no carotid bruits Lungs: no use of accessory muscles, no dullness to percussion, decreased without rales or rhonchi  Cardiovascular: Rhythm regular, heart sounds  normal, no murmurs or gallops, no peripheral edema Musculoskeletal: No deformities, no cyanosis or clubbing , no tremors        Assessment & Plan:

## 2022-10-03 ENCOUNTER — Encounter: Payer: Self-pay | Admitting: Family Medicine

## 2022-10-03 ENCOUNTER — Ambulatory Visit: Payer: Medicare PPO | Admitting: Family Medicine

## 2022-10-03 VITALS — BP 130/70 | HR 64 | Temp 97.3°F | Ht 66.0 in | Wt 198.4 lb

## 2022-10-03 DIAGNOSIS — E1169 Type 2 diabetes mellitus with other specified complication: Secondary | ICD-10-CM | POA: Diagnosis not present

## 2022-10-03 DIAGNOSIS — E785 Hyperlipidemia, unspecified: Secondary | ICD-10-CM | POA: Diagnosis not present

## 2022-10-03 DIAGNOSIS — I10 Essential (primary) hypertension: Secondary | ICD-10-CM | POA: Diagnosis not present

## 2022-10-03 DIAGNOSIS — E119 Type 2 diabetes mellitus without complications: Secondary | ICD-10-CM

## 2022-10-03 LAB — COMPREHENSIVE METABOLIC PANEL
ALT: 13 U/L (ref 0–35)
AST: 26 U/L (ref 0–37)
Albumin: 4.4 g/dL (ref 3.5–5.2)
Alkaline Phosphatase: 119 U/L — ABNORMAL HIGH (ref 39–117)
BUN: 21 mg/dL (ref 6–23)
CO2: 27 mEq/L (ref 19–32)
Calcium: 9.3 mg/dL (ref 8.4–10.5)
Chloride: 98 mEq/L (ref 96–112)
Creatinine, Ser: 0.52 mg/dL (ref 0.40–1.20)
GFR: 93.19 mL/min (ref >60.00)
Glucose, Bld: 90 mg/dL (ref 70–99)
Potassium: 4.4 mEq/L (ref 3.5–5.1)
Sodium: 132 mEq/L — ABNORMAL LOW (ref 135–145)
Total Bilirubin: 0.4 mg/dL (ref 0.2–1.2)
Total Protein: 6.4 g/dL (ref 6.0–8.3)

## 2022-10-03 LAB — CBC WITH DIFFERENTIAL/PLATELET
Basophils Absolute: 0.1 10*3/uL (ref 0.0–0.1)
Basophils Relative: 0.7 % (ref 0.0–3.0)
Eosinophils Absolute: 0.1 10*3/uL (ref 0.0–0.7)
Eosinophils Relative: 0.7 % (ref 0.0–5.0)
HCT: 32.9 % — ABNORMAL LOW (ref 36.0–46.0)
Hemoglobin: 10.7 g/dL — ABNORMAL LOW (ref 12.0–15.0)
Lymphocytes Relative: 23.3 % (ref 12.0–46.0)
Lymphs Abs: 2.3 10*3/uL (ref 0.7–4.0)
MCHC: 32.4 g/dL (ref 30.0–36.0)
MCV: 77.4 fl — ABNORMAL LOW (ref 78.0–100.0)
Monocytes Absolute: 0.8 10*3/uL (ref 0.1–1.0)
Monocytes Relative: 8.3 % (ref 3.0–12.0)
Neutro Abs: 6.6 10*3/uL (ref 1.4–7.7)
Neutrophils Relative %: 67 % (ref 43.0–77.0)
Platelets: 399 10*3/uL (ref 150.0–400.0)
RBC: 4.25 Mil/uL (ref 3.87–5.11)
RDW: 15.9 % — ABNORMAL HIGH (ref 11.5–15.5)
WBC: 9.9 10*3/uL (ref 4.0–10.5)

## 2022-10-03 LAB — LIPID PANEL
Cholesterol: 147 mg/dL (ref 0–200)
HDL: 72.7 mg/dL (ref >39.00)
LDL Cholesterol: 61 mg/dL (ref 0–99)
NonHDL: 74.64
Total CHOL/HDL Ratio: 2
Triglycerides: 68 mg/dL (ref 0.0–149.0)
VLDL: 13.6 mg/dL (ref 0.0–40.0)

## 2022-10-03 LAB — HEMOGLOBIN A1C: Hgb A1c MFr Bld: 6.3 % (ref 4.6–6.5)

## 2022-10-03 NOTE — Progress Notes (Signed)
Phone [phone] In person visit   Subjective:   [name] is a [age] y.o. year old very pleasant female patient who presents for/with See problem oriented charting Chief Complaint  Patient presents with   Follow-up   Diabetes   Hypertension   Past Medical History-  Patient Active Problem List   Diagnosis Date Noted   History of CVA in adulthood 08/13/2017    Priority: High   S/P minimally invasive aortic valve replacement with bioprosthetic valve 08/23/2016    Priority: High   Diastolic dysfunction     Priority: High   Diabetes mellitus 05/12/2014    Priority: High   Vitamin D deficiency 06/04/2018    Priority: Medium    Hyponatremia 02/17/2018    Priority: Medium    Primary hypertension 01/02/2017    Priority: Medium    Osteopenia 05/27/2016    Priority: Medium    OSA (obstructive sleep apnea) 02/27/2016    Priority: Medium    Elevated alkaline phosphatase level 05/16/2015    Priority: Medium    GERD (gastroesophageal reflux disease) 03/10/2014    Priority: Medium    Generalized anxiety disorder 03/10/2014    Priority: Medium    Migraines 03/10/2014    Priority: Medium    Irritable bowel syndrome 12/15/2008    Priority: Medium    Hyperlipidemia associated with type 2 diabetes mellitus 02/09/2008    Priority: Medium    Blurry vision 08/13/2017    Priority: Low   Bruxism 10/23/2015    Priority: Low   Atypical chest pain 11/10/2014    Priority: Low   Acne 05/12/2014    Priority: Low   EUSTACHIAN TUBE DYSFUNCTION, CHRONIC 06/14/2010    Priority: Low   Low back pain 08/11/2008    Priority: Low   Allergic rhinitis 02/12/2007    Priority: Low   Asthma 02/12/2007    Priority: Low   S/P reverse total shoulder arthroplasty, left 02/13/2021    Priority: 1.   Class 2 severe obesity with serious comorbidity and body mass index (BMI) of 36.0 to 36.9 in adult 05/09/2022   Type 2 diabetes mellitus with obesity 05/09/2022   Hypertension associated with  type 2 diabetes mellitus 05/09/2022   SOB (shortness of breath) 12/10/2021   Idiopathic hyperphosphatasia 08/03/2019    Medications- reviewed and updated Current Outpatient Medications  Medication Sig Dispense Refill   ACCU-CHEK GUIDE test strip USE TO TEST BLOOD SUGARS DAILY. DX: E11.9 100 strip 12   Accu-Chek Softclix Lancets lancets USE AS INSTRUCTED 100 each 12   amLODipine (NORVASC) 10 MG tablet Take 1 tablet (10 mg total) by mouth daily. 90 tablet 2   atorvastatin (LIPITOR) 40 MG tablet TAKE 1 TABLET BY MOUTH DAILY AT 6 PM. 90 tablet 3   Biotin 5 MG TBDP SMARTSIG:1 By Mouth     blood glucose meter kit and supplies KIT Dispense based on patient and insurance preference. Use up to four times daily as directed. Dx: E11.9 1 each 0   calcium-vitamin D (OSCAL WITH D) 500-200 MG-UNIT per tablet Take 2 tablets by mouth daily.      cloNIDine (CATAPRES) 0.1 MG tablet Take 1 tablet (0.1 mg total) by mouth at bedtime. 90 tablet 2   clopidogrel (PLAVIX) 75 MG tablet TAKE 1 TABLET BY MOUTH EVERY DAY 90 tablet 3   D-5000 125 MCG (5000 UT) TABS SMARTSIG:1 Tablet(s) By Mouth     demeclocycline (DECLOMYCIN) 300 MG tablet Take 1 tablet (300 mg total) by mouth 2 (two)  times daily. 180 tablet 3   fluticasone (FLONASE) 50 MCG/ACT nasal spray Place 2 sprays into both nostrils daily.     ipratropium (ATROVENT) 0.03 % nasal spray 2 SPRAYS IN EACH NOSTRIL AS NEEDED THREE TIMES AS NEEDED     irbesartan (AVAPRO) 300 MG tablet Take 1 tablet (300 mg total) by mouth daily. 90 tablet 3   loratadine (CLARITIN) 10 MG tablet Take 10 mg by mouth at bedtime.      Melatonin 5 MG TABS Take 5 mg by mouth at bedtime as needed (sleep).     metFORMIN (GLUCOPHAGE) 500 MG tablet TAKE 1 TABLET BY MOUTH WITH BREAKFAST AND 1/2 TO 1 TABLET WITH DINNER (Patient taking differently: Take 500 mg by mouth daily. Taking 1 tablet one time daily.) 180 tablet 1   metoCLOPramide (REGLAN) 10 MG tablet TAKE 1/2 TABLET BY MOUTH 4 TIMES A DAY 60  tablet 3   metroNIDAZOLE (METROCREAM) 0.75 % cream      Multiple Vitamins-Minerals (MULTIVITAMIN ADULTS) TABS SMARTSIG:1 By Mouth     omeprazole (PRILOSEC) 40 MG capsule Take 1 capsule by mouth daily.     Probiotic Product (ALIGN) 4 MG CAPS Take 4 mg by mouth daily.      psyllium (METAMUCIL SMOOTH TEXTURE) 28 % packet Take 1 packet by mouth 2 (two) times daily.     sodium chloride 1 g tablet Take 3 g by mouth 2 (two) times daily with a meal.     venlafaxine XR (EFFEXOR-XR) 75 MG 24 hr capsule TAKE 1 CAPSULE BY MOUTH DAILY WITH BREAKFAST. 90 capsule 2   No current facility-administered medications for this visit.     Objective:  BP 130/70   Pulse 64   Temp (!) 97.3 F (36.3 C)   Ht 5\' 6"  (1.676 m)   Wt 198 lb 6.4 oz (90 kg)   LMP  (LMP Unknown)   SpO2 98%   BMI 32.02 kg/m  Gen: NAD, resting comfortably CV: RRR no murmurs rubs or gallops Lungs: CTAB no crackles, wheeze, rhonchi Abdomen: soft/nontender/nondistended/normal bowel sounds. No rebound or guarding.  Ext: no edema Skin: warm, dry Neuro: grossly normal, moves all extremities  Diabetic Foot Exam - Simple   Simple Foot Form Diabetic Foot exam was performed with the following findings: Yes 10/03/2022  1:36 PM  Visual Inspection No deformities, no ulcerations, no other skin breakdown bilaterally: Yes Sensation Testing Intact to touch and monofilament testing bilaterally: Yes Pulse Check Posterior Tibialis and Dorsalis pulse intact bilaterally: Yes Comments        Assessment and Plan   #Right shoulder- still in physical therapy. Working with Dr. Dion Saucier. Getting CT with contrast on 25th.   #History of stroke-remains on Plavix 75 mg long-term   %s/p aortic valve replacement with bioprosthetic valve- requires dental prophylaxis. Follows with Dr. Delton See. Catheterization 08/02/16 with minor nonobstructive CAD.   % OSA- prior CPAP -now status post hypoglossal nerve stimulator inspire implant 04/23/22  %Obesity-  working hard with weight to wellness program- ongoing excellent efforts  # Diabetes S: compliant with metformin 500mg  BID in past- now down to 500 AM only CBGs- 130-140 Lab Results  Component Value Date   HGBA1C 6.3 (H) 03/05/2022   HGBA1C 6.3 09/24/2021   HGBA1C 6.6 (H) 06/05/2021  A/P: hopefully stable- update a1c today along with other labs. Continue current meds for now   #hypertension/diastolic dysfunction (prior listed as CHF but no chronic lasix need and no hospitalization for HF) S:medication: amlodipine 10mg , irbesartan 300mg ,  now on clonidine 0.1mg  nightly to see if helps with sweating issues- some help - Prior on metoprolol 12.5mg  BID and lasix BP Readings from Last 3 Encounters:  10/03/22 130/70  10/02/22 (!) 140/60  09/19/22 128/82  A/P: stable- continue current medicines   #hyperlipidemia S: for lipids compliant with  atorvastatin 40mg . LDL goal under 70 with cerebrovascular accident history Lab Results  Component Value Date   CHOL 144 06/05/2021   HDL 64 06/05/2021   LDLCALC 65 06/05/2021   LDLDIRECT 67.0 04/03/2022   TRIG 75 06/05/2021   CHOLHDL 2.3 06/05/2021  A/P:  hopefully stable- update lipid pane today. Continue current meds for now   # generalized anxiety S: patient is compliant with venlafaxine 75mg .  possible SIADH.  A/P: doing well- continue current medications    # Migraines - follows with Dr. Everlena Cooper S:patient with history of stroke- sumatriptan was stopped due to stroke history. Venlafaxine mentioned for migraine prophylaxis. PRN treatment with ibuprofen or execdrin with reglan per Dr. Everlena Cooper- thought lower risk than sumatriptan - very rare recently- just had one before surgery- when off plavix  A/P: reasonable control- continue current medications    # Low back pain/degenerative disc disease- better lately- no recent Dr. Maurice Small visit  # GERD S:Dr. Marina Goodell has advised higher dose omeprazole at 40mg  per patient so she wants to continue this dose-  compliant - mostly controlled unless eats one of trigger foods  A/P: stable- continue current medicines    # Low Bone density (formerly osteopenia) S: Last DEXA:  04/12/22 with osteopenia but in range where could be treated  Calcium: 1200mg  (through diet ok) recommended - taking Vitamin D: 1000 units a day recommended- taking  A/P: osteopenia noted  - plan is to repeat 03/2024 and reevaluate- work on calcium/vitamin D/weight bearing exercise  Recommended follow up: Return in about 7 months (around 05/05/2023) for physical or sooner if needed.Schedule b4 you leave. Future Appointments  Date Time Provider Department Center  10/08/2022 12:00 PM William Hamburger D, NP [facility] None  10/09/2022 12:30 PM Iona Beard, PT [facility]  10/11/2022 11:00 AM Iona Beard, PT [facility]  10/14/2022  2:30 PM Drema Dallas, DO [facility] None  10/15/2022 12:30 PM Iona Beard, PT [facility]  10/17/2022  2:00 PM [facility] DG [facility] RM 3 [facility]315DG [facility]. [facility]  10/17/2022  2:20 PM GI-315 CT 1 GI-315CT [facility]  04/29/2023  2:20 PM Carlus Pavlov, MD [facility] None  08/27/2023  3:00 PM [facility] ECHO 4 [id number]   Lab/Order associations:   ICD-10-CM   1. Hyperlipidemia associated with type 2 diabetes mellitus  E11.69    E78.5     2. Type 2 diabetes mellitus without complication, unspecified whether long term insulin use  E11.9     3. Primary hypertension  I10       No orders of the defined types were placed in this encounter.   Return precautions advised.  Tana Conch, MD

## 2022-10-03 NOTE — Patient Instructions (Addendum)
Let us know if you get anymore COVID vaccines.  Please stop by lab before you go If you have mychart- we will send your results within 3 business days of [name] receiving them.  If you do not have mychart- we will call you about results within 5 business days of [name] receiving them.  *please also note that you will see labs on mychart as soon as they post. I will later go in and write notes on them- will say "notes from Dr. Durene Cal"   Recommended follow up: Return in about 7 months (around 05/05/2023) for physical or sooner if needed.Schedule b4 you leave.

## 2022-10-04 ENCOUNTER — Encounter: Payer: Self-pay | Admitting: Family Medicine

## 2022-10-04 ENCOUNTER — Encounter: Payer: Self-pay | Admitting: Cardiology

## 2022-10-04 ENCOUNTER — Encounter: Payer: Self-pay | Admitting: Pulmonary Disease

## 2022-10-04 ENCOUNTER — Other Ambulatory Visit: Payer: Self-pay

## 2022-10-04 DIAGNOSIS — D649 Anemia, unspecified: Secondary | ICD-10-CM

## 2022-10-07 ENCOUNTER — Telehealth: Payer: Self-pay | Admitting: Family Medicine

## 2022-10-07 NOTE — Telephone Encounter (Signed)
Called pt and LVM informing to schedule lab re-check in 3 weeks per PCP request.

## 2022-10-08 ENCOUNTER — Encounter (INDEPENDENT_AMBULATORY_CARE_PROVIDER_SITE_OTHER): Payer: Self-pay | Admitting: Adult Health

## 2022-10-08 ENCOUNTER — Ambulatory Visit (INDEPENDENT_AMBULATORY_CARE_PROVIDER_SITE_OTHER): Payer: Medicare PPO | Admitting: Adult Health

## 2022-10-08 VITALS — BP 146/67 | HR 65 | Temp 98.8°F | Ht 66.0 in | Wt 194.0 lb

## 2022-10-08 DIAGNOSIS — G4733 Obstructive sleep apnea (adult) (pediatric): Secondary | ICD-10-CM | POA: Diagnosis not present

## 2022-10-08 DIAGNOSIS — Z7984 Long term (current) use of oral hypoglycemic drugs: Secondary | ICD-10-CM | POA: Diagnosis not present

## 2022-10-08 DIAGNOSIS — E119 Type 2 diabetes mellitus without complications: Secondary | ICD-10-CM

## 2022-10-08 DIAGNOSIS — E669 Obesity, unspecified: Secondary | ICD-10-CM | POA: Diagnosis not present

## 2022-10-08 DIAGNOSIS — Z6831 Body mass index (BMI) 31.0-31.9, adult: Secondary | ICD-10-CM

## 2022-10-08 NOTE — Progress Notes (Signed)
WEIGHT SUMMARY AND BIOMETRICS  Vitals Temp: 98.8 F (37.1 C) BP: (!) 146/67 Pulse Rate: 65 SpO2: 98 %   Anthropometric Measurements Height:  (1.676 m) Weight: 194 lb (88 kg) BMI (Calculated): 31.33 Weight at Last Visit: 194lb Weight Lost Since Last Visit: 0 Weight Gained Since Last Visit: 0 Starting Weight: 220lb Total Weight Loss (lbs): 26 lb (11.8 kg)   Body Composition  Body Fat %: 45.8 % Fat Mass (lbs): 89.2 lbs Muscle Mass (lbs): 100 lbs Total Body Water (lbs): 72.6 lbs Visceral Fat Rating : 13   Other Clinical Data Fasting: no Labs: No Today's Visit #: 10 Starting Date: 01/19/18    Chief Complaint:   OBESITY [name] is here to discuss her progress with her obesity treatment plan. She is on the the Category 2 Plan and states she is following her eating plan approximately 60 % of the time.  She states she is exercising OutPatient PT 45 minutes 2 times per week- R Shoulder   Interim History:  [facility] OV at [facility]- She has seen her Pulmonologist, PCP, Cardiologist, Orthopedic Surgeon, and multiple PT appts. She has upcoming CT with contrast of R shoulder- due to implanted Hypoglossal nerve stimulator, MRI imaging is contraindicated.  Subjective:   1. Type 2 diabetes mellitus without complication, unspecified whether long term insulin use When pt checked in she was diaphorethic and weak- she was given peanut butter crackers and 10 mins later sx's resolved. She reports eating breakfast much earlier than usual and forgot to pack her CHO snack. She denies acute cardiac sx's. Unable to check CBG. PCP has been titrating Metformin therapy down as she has been losing weight. She is currently on Metformin  QD. Lab Results  Component Value Date   HGBA1C 6.3 10/03/2022   HGBA1C 6.3 (H) 03/05/2022   HGBA1C 6.3 09/24/2021   2. OSA on CPAP 10/02/2022 OV with Pulmonology Dr. Vassie Loll: Hypoglossal nerve stimulator was reassessed today.  Goal of the  follow-up visit was to ensure good compliance, good subjective benefit, good tongue motion and good sense lead waveforms .incision sites appear good, tongue protrusion was examined.  Download was reviewed and usage appears to be up to 12 hours average per night.  Spouse reports that snoring is decreased and she has more energy.  She denies any discomfort.  She is at level 4 = 1.9V.  During sleep study old-age was titrated from 1.6 to 2.6 V and residual hypopneas were noted.  I suspect overstimulation based on exam today.  She has good tongue protrusion on even lower voltages. Programming :  1.  Stimulation level : Sensation 1.2 V , we changed her to a level 3= 1.4 V, change maximum 26 V 2. Start delay 30 minutes, pause time 15 minutes, duration 12 hours 3.  Sensing waveform was analyzed for 3 minutes.  We also checked impedance on electrode configuration B and C in case that is required 4.  Sleep remote education was provided patient demonstrated competency with the remote and was aware of patient Instruction videos and sleep remote guide 5.  Patient was instructed to use her device all night every night 6.   Home sleep test will be scheduled 1.4 V.  If hypopneas are controlled and we will continue her at this level otherwise we will have to go down yellow pathway and change her code configuration Download was reviewed to confirm good compliance with the device  Assessment/Plan:   1. Type 2 diabetes mellitus  without complication, unspecified whether long term insulin use Provided pt with CHO snack and encouraged to always have CHO on person. Consume food every few hours. Check CBG fasting every am and after meals several times per week. Also check CBG if sx's of hypoglycemia develop- pt verbalized understanding and agreement.   2. OSA on CPAP Continue with weight loss efforts. Device- Hypoglossal nerve stimulator  Inquire about Medical Alert Bracelet in regards to MRI and implantable  device  3. Obesity, current BMI 31.33  [name] is currently in the action stage of change. As such, her goal is to continue with weight loss efforts. She has agreed to the Category 2 Plan.   Exercise goals: Older adults should follow the adult guidelines. When older adults cannot meet the adult guidelines, they should be as physically active as their abilities and conditions will allow.   Behavioral modification strategies: increasing lean protein intake, decreasing simple carbohydrates, increasing vegetables, increasing water intake, no skipping meals, meal planning and cooking strategies, and planning for success.  [name] has agreed to follow-up with our clinic in 4 weeks. She was informed of the importance of frequent follow-up visits to maximize her success with intensive lifestyle modifications for her multiple health conditions.   Objective:   Blood pressure (!) 146/67, pulse 65, temperature 98.8 F (37.1 C), height  (1.676 m), weight 194 lb (88 kg), SpO2 98 %. Body mass index is 31.31 kg/m.  General: Cooperative, alert, well developed, in no acute distress. HEENT: Conjunctivae and lids unremarkable. Cardiovascular: Regular rhythm.  Lungs: Normal work of breathing. Neurologic: No focal deficits.   Lab Results  Component Value Date   CREATININE 0.52 10/03/2022   BUN 21 10/03/2022   NA 132 (L) 10/03/2022   K 4.4 10/03/2022   CL 98 10/03/2022   CO2 27 10/03/2022   Lab Results  Component Value Date   ALT 13 10/03/2022   AST 26 10/03/2022   ALKPHOS 119 (H) 10/03/2022   BILITOT 0.4 10/03/2022   Lab Results  Component Value Date   HGBA1C 6.3 10/03/2022   HGBA1C 6.3 (H) 03/05/2022   HGBA1C 6.3 09/24/2021   HGBA1C 6.6 (H) 06/05/2021   HGBA1C 6.5 (H) 02/09/2021   Lab Results  Component Value Date   INSULIN 6.3 03/05/2022   INSULIN 12.9 06/05/2021   INSULIN 7.7 01/25/2021   INSULIN 13.1 04/20/2020   INSULIN 12.1 01/19/2018   Lab Results  Component  Value Date   TSH 4.23 09/17/2021   Lab Results  Component Value Date   CHOL 147 10/03/2022   HDL 72.70 10/03/2022   LDLCALC 61 10/03/2022   LDLDIRECT 67.0 04/03/2022   TRIG 68.0 10/03/2022   CHOLHDL 2 10/03/2022   Lab Results  Component Value Date   VD25OH 62.2 03/05/2022   VD25OH 70.1 06/05/2021   VD25OH 74.5 01/25/2021   Lab Results  Component Value Date   WBC 9.9 10/03/2022   HGB 10.7 (L) 10/03/2022   HCT 32.9 (L) 10/03/2022   MCV 77.4 (L) 10/03/2022   PLT 399.0 10/03/2022   Lab Results  Component Value Date   IRON 48 06/12/2022   TIBC 450.8 (H) 06/12/2022   FERRITIN 12.0 06/12/2022   Attestation Statements:   Reviewed by clinician on day of visit: allergies, medications, problem list, medical history, surgical history, family history, social history, and previous encounter notes.  Time spent on visit including pre-visit chart review and post-visit care and charting was 28 minutes.   I have reviewed the above documentation  for accuracy and completeness, and I agree with the above. -  Hiedi Touchton d. Donnella Morford, NP-C

## 2022-10-09 ENCOUNTER — Encounter: Payer: Self-pay | Admitting: Physical Therapy

## 2022-10-09 ENCOUNTER — Ambulatory Visit: Payer: Medicare PPO | Admitting: Physical Therapy

## 2022-10-09 DIAGNOSIS — M25612 Stiffness of left shoulder, not elsewhere classified: Secondary | ICD-10-CM

## 2022-10-09 DIAGNOSIS — M25811 Other specified joint disorders, right shoulder: Secondary | ICD-10-CM

## 2022-10-09 DIAGNOSIS — M6281 Muscle weakness (generalized): Secondary | ICD-10-CM

## 2022-10-09 DIAGNOSIS — M25611 Stiffness of right shoulder, not elsewhere classified: Secondary | ICD-10-CM | POA: Diagnosis not present

## 2022-10-09 NOTE — Therapy (Signed)
OUTPATIENT PHYSICAL THERAPY SHOULDER TREATMENT   Patient Name: [name] MRN: [id number] DOB:[birth year], 72 y.o., female Today's Date: 10/09/2022  END OF SESSION:  PT End of Session - 10/09/22 1239     Visit Number 9    Date for PT Re-Evaluation 11/20/22    PT Start Time 1234    PT Stop Time 1312    PT Time Calculation (min) 38 min    Activity Tolerance Patient limited by pain    Behavior During Therapy [facility]: Lambert Rhonda W for tasks assessed/performed             Past Medical History:  Diagnosis Date   Allergy    Anxiety    Arthritis    back & knee   Asthma     mild per pt shows up with resp illness   Colon polyps    Constipation    Diabetes mellitus without complication    Gallstones    Gastric polyps    Gastroparesis    GERD (gastroesophageal reflux disease)    Headache    sinus headaches and migraines at times   Heart murmur    History of migraine headaches    HTN (hypertension)    Hyperlipidemia    IBS (irritable bowel syndrome)    Joint pain    Lumbar disc disease    PONV (postoperative nausea and vomiting)    S/P aortic valve replacement with bioprosthetic valve 08/23/2016   25 mm Edwards Intuity rapid-deployment bovine pericardial tissue valve via partial upper mini sternotomy   Sleep apnea    does not use CPAP   TIA (transient ischemic attack)    TIA (transient ischemic attack)    hx of per pt   Past Surgical History:  Procedure Laterality Date   ABDOMINAL HYSTERECTOMY     AORTIC VALVE REPLACEMENT N/A 08/23/2016   Procedure: AORTIC VALVE REPLACEMENT (AVR) - using partial Upper Sternotomy- 25mm Edwards Intuity Aortic Valve used;  Surgeon: Purcell Nails, MD;  Location: [facility] OR;  Service: Open Heart Surgery;  Laterality: N/A;   BLADDER SUSPENSION  2007   BREAST ENHANCEMENT SURGERY  1980   CARPAL TUNNEL RELEASE Bilateral    COLONOSCOPY     DORSAL COMPARTMENT RELEASE Right 09/15/2014   Procedure: RIGHT WRIST DEQUERVAINS RELEASE ;  Surgeon: Mckinley Jewel,  MD;  Location: [facility];  Service: Orthopedics;  Laterality: Right;   DRUG INDUCED ENDOSCOPY N/A 02/19/2022   Procedure: DRUG INDUCED SLEEP ENDOSCOPY;  Surgeon: Osborn Coho, MD;  Location: [facility];  Service: ENT;  Laterality: N/A;   ESOPHAGOGASTRODUODENOSCOPY     EYE SURGERY     cataract surgery   IMPLANTATION OF HYPOGLOSSAL NERVE STIMULATOR Right 04/23/2022   Procedure: IMPLANTATION OF HYPOGLOSSAL NERVE STIMULATOR;  Surgeon: Osborn Coho, MD;  Location: [facility];  Service: ENT;  Laterality: Right;   KNEE ARTHROSCOPY  04/15/2012   Procedure: ARTHROSCOPY KNEE;  Surgeon: Loreta Ave, MD;  Location: [facility] OR;  Service: Orthopedics;  Laterality: Right;   LAPAROSCOPIC CHOLECYSTECTOMY     LASIK     OOPHORECTOMY     PALATE TO GINGIVA GRAFT  2017   REVERSE SHOULDER ARTHROPLASTY Left 02/13/2021   Procedure: REVERSE SHOULDER ARTHROPLASTY;  Surgeon: Teryl Lucy, MD;  Location: [facility];  Service: Orthopedics;  Laterality: Left;   RIGHT/LEFT HEART CATH AND CORONARY ANGIOGRAPHY N/A 08/02/2016   Procedure: Right/Left Heart Cath and Coronary Angiography;  Surgeon: Tonny Bollman, MD;  Location: [facility] INVASIVE CV LAB;  Service: Cardiovascular;  Laterality: N/A;   ROTATOR CUFF REPAIR Left    TEE WITHOUT CARDIOVERSION N/A 08/23/2016   Procedure: TRANSESOPHAGEAL ECHOCARDIOGRAM (TEE);  Surgeon: Purcell Nails, MD;  Location: [facility] OR;  Service: Open Heart Surgery;  Laterality: N/A;   TOTAL KNEE ARTHROPLASTY  04/15/2012   Procedure: TOTAL KNEE ARTHROPLASTY;  Surgeon: Loreta Ave, MD;  Location: [facility] OR;  Service: Orthopedics;  Laterality: Right;   TRIGGER FINGER RELEASE Right 04/20/2015   Procedure: RIGHT TRIGGER FINGER RELEASE (TENDON SHEALTH INCISION) ;  Surgeon: Loreta Ave, MD;  Location: [facility];  Service: Orthopedics;  Laterality: Right;   Patient Active Problem List   Diagnosis Date Noted   Class 2 severe obesity with serious  comorbidity and body mass index (BMI) of 36.0 to 36.9 in adult 05/09/2022   Type 2 diabetes mellitus with obesity 05/09/2022   Hypertension associated with type 2 diabetes mellitus 05/09/2022   SOB (shortness of breath) 12/10/2021   S/P reverse total shoulder arthroplasty, left 02/13/2021   Idiopathic hyperphosphatasia 08/03/2019   Vitamin D deficiency 06/04/2018   Hyponatremia 02/17/2018   History of CVA in adulthood 08/13/2017   Blurry vision 08/13/2017   Primary hypertension 01/02/2017   S/P minimally invasive aortic valve replacement with bioprosthetic valve 08/23/2016   Diastolic dysfunction    Osteopenia 05/27/2016   OSA (obstructive sleep apnea) 02/27/2016   Bruxism 10/23/2015   Elevated alkaline phosphatase level 05/16/2015   Atypical chest pain 11/10/2014   Acne 05/12/2014   Diabetes mellitus 05/12/2014   GERD (gastroesophageal reflux disease) 03/10/2014   Generalized anxiety disorder 03/10/2014   Migraines 03/10/2014   EUSTACHIAN TUBE DYSFUNCTION, CHRONIC 06/14/2010   Irritable bowel syndrome 12/15/2008   Low back pain 08/11/2008   Hyperlipidemia associated with type 2 diabetes mellitus 02/09/2008   Allergic rhinitis 02/12/2007   Asthma 02/12/2007    PCP: Tana Conch O,MD  REFERRING PROVIDER: Teryl Lucy, MD  REFERRING DIAG: Rt Shoulder impingement   THERAPY DIAG:  Stiffness of right shoulder, not elsewhere classified  Impingement of right shoulder  Muscle weakness (generalized)  Stiffness of left shoulder, not elsewhere classified  Rationale for Evaluation and Treatment: Rehabilitation  ONSET DATE: 08/19/22  SUBJECTIVE:                                                                                                                                                                                      SUBJECTIVE STATEMENT: Patient arrived early, feeling poorly, as if her BS had dropped. She ate a snack and flet able to participate once her treatment  time arrived.  PERTINENT HISTORY: L TSR 2022, R TKR 2013, heart valve replacement, just returned to  Dr with good report.  PAIN:  Are you having pain? Yes: NPRS scale: 5/10 Pain location: R shoulder and proximal arm Pain description: sharp Aggravating factors: Moves certain ways Relieving factors: Not using her arm, don't sleep on R.  PRECAUTIONS: None  WEIGHT BEARING RESTRICTIONS: No  FALLS:  Has patient fallen in last 6 months? No  LIVING ENVIRONMENT: Lives with: lives with their spouse Lives in: House/apartment Stairs: Yes: External: 5 steps; on left going up Has following equipment at home: None  OCCUPATION: Retired. Trying to lose weight, so wants/needs to exercise more, but the shoulder pain limits her ability to exercise.  PLOF: Independent  PATIENT GOALS:Decrease pain. Return to using her R arm wihtout pain.  NEXT MD VISIT: 09/16/22  OBJECTIVE:   DIAGNOSTIC FINDINGS:  X ray 2/27/24showed preservation of the bony architecure of the R shoulder   SURVEYS:  FOTO 48  COGNITION: Overall cognitive status: Within functional limits for tasks assessed     SENSATION: WFL  POSTURE: Slightly forward head, R shoulder rounded forward and elevated, flattened spinal curves, mildly winging scapulae  UPPER EXTREMITY ROM: L WFL, Active elevation mildly limited. Elbow and wrist WFL B.  Active ROM Right eval R 09/13/22  09/19/22 standing   Shoulder flexion 102 139 153  Shoulder extension 102 102 130  Shoulder abduction     Shoulder adduction     Shoulder internal rotation WNL 60 65  Shoulder external rotation 32 75 75  Elbow flexion     Elbow extension     Wrist flexion     Wrist extension     Wrist ulnar deviation     Wrist radial deviation     Wrist pronation     Wrist supination     (Blank rows = not tested)  UPPER EXTREMITY MMT:  MMT Right eval Left eval RT 09/19/22  Shoulder flexion   4/5  Shoulder extension     Shoulder abduction   4-/5 with pain   Shoulder adduction     Shoulder internal rotation   4/5  Shoulder external rotation   4/5 with pain  Middle trapezius     Lower trapezius     Elbow flexion     Elbow extension     Wrist flexion     Wrist extension     Wrist ulnar deviation     Wrist radial deviation     Wrist pronation     Wrist supination     Grip strength (lbs)     (Blank rows = not tested)  SHOULDER SPECIAL TESTS: Impingement tests: Hawkins/Kennedy impingement test: positive  and Painful arc test: positive  Rotator cuff assessment: Drop arm test: positive , Empty can test: positive , Full can test: positive , and Gerber lift off test: negative Biceps assessment: Speed's test: positive   JOINT MOBILITY TESTING:  R humeral glide limited, most in post glide, also inferior glide.  PALPATION:  TTP R cerv paraspinals, up traps, medial scapular border. Tightness noted in R cerv paraspinals, scalenes, UT, LS, pects. TTP supraspinatus tendon and long head of biceps tendon, R.   TODAY'S TREATMENT:  DATE:  10/09/22 NuStep L3 x 6 minutes STM to supraspinatus, UT, cerv paraspinals, pects on R F/B joint mobs for humeral depression and post glide, then stretch to same muscles. Standing towel slides on mat into flex and abd with R, 10 reps each Seated shoulder horizontal abd against red tband x 10 Seated shoulder flex with 2# WATE bar x 10 each, ext, abd CP to R shoulder for pain relief, anti inflammatory control.  09/25/22 NuStep L5 x 6 minutes STM- supraspinatus tendon, bicep tendon Joint mobs-humeral glides, inferior, post, ant, scapular mobs, grade lll2 x 10 each Stretch into shoulder flex, abd, ER Nerve glides in stand, hand on wall and rotate away. Standing shoulder ext, rows, ER with g tband, 2 x 10 reps Wall slides, keeping shoulde < 90 flexion, 12 reps CP to R shoulder x 10  minutes for inflammation.  09/19/22 Nustep L 5 7 min 2# upper cut 2 sets 10 2# hitch hiker 2 sets 10 2# empty can 2 sets 10 Rows and Ext red 2x10 RT Triceps Ext 15lb 2x15 UE ER/IR yellow Tband 2x10 Each ROM and MMT- see above SL abd and ER 2# 10 x 2 sets Supine 2# flex and chest press 2 sets 10 PROM for R shoulder flex abd, ER CP to R shoulder x 10 minutes for inflammation.   09/17/22 NuStep L5 x 6 min RUE ER/IR yellow Tband 2x10 Each Rows and Ext red 2x10 Triceps Ext 15lb 2x15 AAROM Flex, Ext, IR up back 2lb WaTE Flex, abd, circles on wall RUE x10 each  Yellow band 3 way RUE 2x5 each PROM for R shoulder flex abd, ER CP to R shoulder x 10 minutes for inflammation.   09/13/22 AAROM Flex, Ext, IR up back  Biceps curls 2lb dumbbells x10 Triceps Ext 15lb 2x10 NuStep L5 x 6 min RUE ER/IR yellow Tband 2x10 Each PROM for R shoulder flex abd, ER CP to R shoulder x 10 minutes for inflammation.    09/11/22 NuStep L 4 x 6 min AAROM Flex, Ext, IR up back  Seated Rows yellow 2x10 Shoulder Ext Yellow 2x10 RUE ER/IR yellow, very weak with ER Triceps Ext 15lb 2x10, biceps curls 5lb x10 Biceps curls 2lb dumbbells x10 Pendulum exercises with 3# weight, deep breathing to increase stretch STM to cervical muscles and up traps, rhomboids  PROM for R shoulder flex abd, ER CP to R shoulder x 10 minutes for inflammation.   PATIENT EDUCATION: Education details: POC Person educated: Patient Education method: Medical illustrator Education comprehension: verbalized understanding  HOME EXERCISE PROGRAM: [id number]  ASSESSMENT:  CLINICAL IMPRESSION: Pt reports that the Ct is scheduled for 4/25. She felt her BS had dropped prior to treatment, ate some food and was able to tolerate full treatment. Her shoulder pain remains unchanged.  OBJECTIVE IMPAIRMENTS: decreased activity tolerance, decreased coordination, decreased ROM, decreased strength, impaired flexibility, impaired UE  functional use, improper body mechanics, postural dysfunction, and pain.   ACTIVITY LIMITATIONS: carrying, lifting, sleeping, reach over head, and hygiene/grooming  PARTICIPATION LIMITATIONS: cleaning, laundry, community activity, and yard work  PERSONAL FACTORS: Past/current experiences are also affecting patient's functional outcome.   REHAB POTENTIAL: Good  CLINICAL DECISION MAKING: Stable/uncomplicated  EVALUATION COMPLEXITY: low   GOALS: Goals reviewed with patient? Yes  SHORT TERM GOALS: Target date: 09/09/22  I with initial HEP Baseline: Goal status:09/04/22-Initial program provided.  09/19/22 MET  LONG TERM GOALS: Target date: 11/20/22  I with final HEP Baseline:  Goal status: INITIAL  2.  Patient will perform AROM of R shoulder at least 120 elevation, with<3/10 pain. Baseline: 102, with increased pain Goal status: PROM WNL, partly met with flexion 09/25/22 but still having pain with active elevation  3.  Increase R shoulder strength to at least 4-/5 Baseline: Unable to tolerate resistance due to pain Goal status: 10/09/22-ongoing 3+/5  4.  Increase FOTO score to at least 61 Baseline: 48 Goal status: INITIAL  5.  Patient will be able to manage styling her hair and putting on her bra with R shoulder pain < 3/10 Baseline: 8/10 Goal status: Progressing 6/10 10/09/22  PLAN:  PT FREQUENCY: 1-2x/week  PT DURATION: 12 weeks  PLANNED INTERVENTIONS: Therapeutic exercises, Therapeutic activity, Neuromuscular re-education, Balance training, Gait training, Patient/Family education, Self Care, Joint mobilization, Dry Needling, Electrical stimulation, Cryotherapy, Moist heat, Taping, Ionotophoresis 4mg /ml Dexamethasone, and Manual therapy  PLAN FOR NEXT SESSION: progress strength and ROM. Awaiting cat scan  Patient Details  Name: [name] MRN: [id number] Date of Birth: 1951/02/20 Referring Provider:  Shelva Majestic, MD  Iona Beard, DPT  [facility] [address] [place], [place], [address] Phone: [phone]   Fax:  [phone]

## 2022-10-10 ENCOUNTER — Other Ambulatory Visit: Payer: Self-pay

## 2022-10-10 MED ORDER — IRBESARTAN 300 MG PO TABS: 300.0000 mg | ORAL_TABLET | Freq: Every day | ORAL | 3 refills | Status: DC

## 2022-10-10 NOTE — Progress Notes (Signed)
NEUROLOGY FOLLOW UP OFFICE NOTE  GORGEOUS NEWLUN [id number]  Assessment/Plan:   1.  Migraine with persistent aura, with cerebral infarction 2.  Migraine with aura 3.  HTN 4.  Type 2 diabetes mellitus 5.  Hyperlipidemia   1.  Migraine prevention:  Venlafaxine XR 75mg  daily (prescribed by PCP) 2.  Limit use of pain relievers to no more than 2 days out of week to prevent risk of rebound or medication-overuse headache. 3.  Secondary stroke prevention (as managed by PCP):  - Plavix 75mg  daily  - Statin.  LDL goal less than 70  - Hgb A1c goal less than 7  - Normotensive blood pressure 4.  Follow up as needed  Subjective:  DENZIL BRISTOL is a 72 year old right-handed female with type 2 diabetes, hypertension, hyperlipidemia, status post bovine aortic valve and migraine who follows up for stroke and migraine.   UPDATE:  Current medications: Plavix, atorvastatin 40 mg, Lopressor, insulin, metformin.  For migraine prophylaxis, she is taking venlafaxine XR 75 mg daily.   The only time she had migraines was when she had to briefly discontinue Plavix prior to the [facility] in October.  Otherwise, doing well.  Labs this month include Hgb A1c 6.3 and LDL 61.   HISTORY: Ms. Stoker has history of migraines since high school.  They are left sided frontal throbbing headaches of moderate to severe intensity.  They typically last 3 hours and occur once a week.  They are preceded by an aura of spots in the vision of both eyes, lasting 2 hours.  They are aggravated by stress and relieved by rest.  She typically takes Imitrex, which is effective.   On 08/07/17, she developed one of her habitual migraines with aura.  However, the headache was more severe and the visual aura did not resolve.  The headache was severe for about 3 days and then tapered off after another 4 days.  The visual aura lasted up to a week.  She did not have any facial droop, unilateral numbness or weakness, ataxia or  vertigo.  CT of head from 08/12/17 was personally reviewed and revealed a small patchy hypodensity in the right occipital lobe concerning for subacute infarct.  MRA of head from 08/21/17 was personally reviewed and revealed flow-limiting stenosis versus artifact in the left M2 segement but otherwise intracranial arteries patent, including right PCA.  Carotid doppler from 08/19/17 revealed no hemodynamically significant bilateral ICA stenosis and both vertebral arteries were patent with antegrade flow.  Echocardiogram from yesterday showed EF 60-65% with no cardiac source of emboli.  She is currently with heart monitor.   Her ASA 81mg  was switched to Plavix 75mg  and she was advised to take atorvastatin daily.  She was advised to stop Imitrex.  Tylenol is ineffective.  She takes venlafaxine XR 150mg  daily for anxiety.  PAST MEDICAL HISTORY: Past Medical History:  Diagnosis Date   Allergy    Anxiety    Arthritis    back & knee   Asthma     mild per pt shows up with resp illness   Colon polyps    Constipation    Diabetes mellitus without complication    Gallstones    Gastric polyps    Gastroparesis    GERD (gastroesophageal reflux disease)    Headache    sinus headaches and migraines at times   Heart murmur    History of migraine headaches    HTN (hypertension)    Hyperlipidemia  IBS (irritable bowel syndrome)    Joint pain    Lumbar disc disease    PONV (postoperative nausea and vomiting)    S/P aortic valve replacement with bioprosthetic valve 08/23/2016   25 mm Edwards Intuity rapid-deployment bovine pericardial tissue valve via partial upper mini sternotomy   Sleep apnea    does not use CPAP   TIA (transient ischemic attack)    TIA (transient ischemic attack)    hx of per pt    MEDICATIONS: Current Outpatient Medications on File Prior to Visit  Medication Sig Dispense Refill   ACCU-CHEK GUIDE test strip USE TO TEST BLOOD SUGARS DAILY. DX: E11.9 100 strip 12   Accu-Chek  Softclix Lancets lancets USE AS INSTRUCTED 100 each 12   amLODipine (NORVASC) 10 MG tablet Take 1 tablet (10 mg total) by mouth daily. 90 tablet 2   atorvastatin (LIPITOR) 40 MG tablet TAKE 1 TABLET BY MOUTH DAILY AT 6 PM. 90 tablet 3   Biotin 5 MG TBDP SMARTSIG:1 By Mouth     blood glucose meter kit and supplies KIT Dispense based on patient and insurance preference. Use up to four times daily as directed. Dx: E11.9 1 each 0   calcium-vitamin D (OSCAL WITH D) 500-200 MG-UNIT per tablet Take 2 tablets by mouth daily.      cloNIDine (CATAPRES) 0.1 MG tablet Take 1 tablet (0.1 mg total) by mouth at bedtime. 90 tablet 2   clopidogrel (PLAVIX) 75 MG tablet TAKE 1 TABLET BY MOUTH EVERY DAY 90 tablet 3   D-5000 125 MCG (5000 UT) TABS SMARTSIG:1 Tablet(s) By Mouth     demeclocycline (DECLOMYCIN) 300 MG tablet Take 1 tablet (300 mg total) by mouth 2 (two) times daily. 180 tablet 3   fluticasone (FLONASE) 50 MCG/ACT nasal spray Place 2 sprays into both nostrils daily.     ipratropium (ATROVENT) 0.03 % nasal spray 2 SPRAYS IN EACH NOSTRIL AS NEEDED THREE TIMES AS NEEDED     irbesartan (AVAPRO) 300 MG tablet Take 1 tablet (300 mg total) by mouth daily. 90 tablet 3   loratadine (CLARITIN) 10 MG tablet Take 10 mg by mouth at bedtime.      Melatonin 5 MG TABS Take 5 mg by mouth at bedtime as needed (sleep).     metFORMIN (GLUCOPHAGE) 500 MG tablet TAKE 1 TABLET BY MOUTH WITH BREAKFAST AND 1/2 TO 1 TABLET WITH DINNER (Patient taking differently: Take 500 mg by mouth daily. Taking 1 tablet one time daily.) 180 tablet 1   metoCLOPramide (REGLAN) 10 MG tablet TAKE 1/2 TABLET BY MOUTH 4 TIMES A DAY 60 tablet 3   metroNIDAZOLE (METROCREAM) 0.75 % cream      Multiple Vitamins-Minerals (MULTIVITAMIN ADULTS) TABS SMARTSIG:1 By Mouth     omeprazole (PRILOSEC) 40 MG capsule Take 1 capsule by mouth daily.     Probiotic Product (ALIGN) 4 MG CAPS Take 4 mg by mouth daily.      psyllium (METAMUCIL SMOOTH TEXTURE) 28 %  packet Take 1 packet by mouth 2 (two) times daily.     sodium chloride 1 g tablet Take 3 g by mouth 2 (two) times daily with a meal.     venlafaxine XR (EFFEXOR-XR) 75 MG 24 hr capsule TAKE 1 CAPSULE BY MOUTH DAILY WITH BREAKFAST. 90 capsule 2   No current facility-administered medications on file prior to visit.    ALLERGIES: Allergies  Allergen Reactions   Augmentin [Amoxicillin-Pot Clavulanate] Rash   Erythromycin Nausea Only   Penicillins Itching  and Rash    Has patient had a PCN reaction causing immediate rash, facial/tongue/throat swelling, SOB or lightheadedness with hypotension: No Has patient had a PCN reaction causing severe rash involving mucus membranes or skin necrosis: No Has patient had a PCN reaction that required hospitalization: No Has patient had a PCN reaction occurring within the last 10 years: No  If all of the above answers are "NO", then may proceed with Cephalosporin use. Tolerated Ancef 02/13/21    FAMILY HISTORY: Family History  Problem Relation Age of Onset   COPD Mother    Colon polyps Mother    Irritable bowel syndrome Mother    Anxiety disorder Mother    Heart disease Father    Alcohol abuse Father    Breast cancer Maternal Grandmother    Gallbladder disease Maternal Grandmother    Colon polyps Maternal Aunt    Diabetes Paternal Uncle        several uncles   Other Neg Hx        hyponatremia   Colon cancer Neg Hx    Esophageal cancer Neg Hx    Rectal cancer Neg Hx    Stomach cancer Neg Hx       Objective:  Blood pressure (!) 151/78, pulse 83, height  (1.676 m), weight 200 lb (90.7 kg), SpO2 98 %. General: No acute distress.  Patient appears well-groomed.   Head:  Normocephalic/atraumatic Eyes:  Fundi examined but not visualized Neck: supple, no paraspinal tenderness, full range of motion Heart:  Regular rate and rhythm Neurological Exam: alert and oriented to person, place, and time.  Speech fluent and not dysarthric, language  intact.  CN II-XII intact. Bulk and tone normal, muscle strength 5/5 throughout.  Sensation to pinprick intact, vibratory sensation reduced in toes  Deep tendon reflexes trace throughout.  Finger to nose testing intact.  Gait normal, Romberg negative.   Shon Millet, DO  CC: Tana Conch, MD

## 2022-10-11 ENCOUNTER — Ambulatory Visit: Payer: Medicare PPO | Admitting: Physical Therapy

## 2022-10-11 ENCOUNTER — Encounter: Payer: Self-pay | Admitting: Physical Therapy

## 2022-10-11 DIAGNOSIS — M25811 Other specified joint disorders, right shoulder: Secondary | ICD-10-CM | POA: Diagnosis not present

## 2022-10-11 DIAGNOSIS — M6281 Muscle weakness (generalized): Secondary | ICD-10-CM

## 2022-10-11 DIAGNOSIS — M25611 Stiffness of right shoulder, not elsewhere classified: Secondary | ICD-10-CM

## 2022-10-11 DIAGNOSIS — M25612 Stiffness of left shoulder, not elsewhere classified: Secondary | ICD-10-CM | POA: Diagnosis not present

## 2022-10-11 NOTE — Therapy (Signed)
OUTPATIENT PHYSICAL THERAPY SHOULDER TREATMENT  Progress Note Reporting Period 08/28/22 to 10/11/22  See note below for Objective Data and Assessment of Progress/Goals.      Patient Name: [name] MRN: [id number] DOB:1951-01-19, 72 y.o., female Today's Date: 10/11/2022  END OF SESSION:  PT End of Session - 10/11/22 1143     Visit Number 10    Date for PT Re-Evaluation 11/20/22    PT Start Time 1100    PT Stop Time 1145    PT Time Calculation (min) 45 min              Past Medical History:  Diagnosis Date   Allergy    Anxiety    Arthritis    back & knee   Asthma     mild per pt shows up with resp illness   Colon polyps    Constipation    Diabetes mellitus without complication    Gallstones    Gastric polyps    Gastroparesis    GERD (gastroesophageal reflux disease)    Headache    sinus headaches and migraines at times   Heart murmur    History of migraine headaches    HTN (hypertension)    Hyperlipidemia    IBS (irritable bowel syndrome)    Joint pain    Lumbar disc disease    PONV (postoperative nausea and vomiting)    S/P aortic valve replacement with bioprosthetic valve 08/23/2016   25 mm Edwards Intuity rapid-deployment bovine pericardial tissue valve via partial upper mini sternotomy   Sleep apnea    does not use CPAP   TIA (transient ischemic attack)    TIA (transient ischemic attack)    hx of per pt   Past Surgical History:  Procedure Laterality Date   ABDOMINAL HYSTERECTOMY     AORTIC VALVE REPLACEMENT N/A 08/23/2016   Procedure: AORTIC VALVE REPLACEMENT (AVR) - using partial Upper Sternotomy- 25mm Edwards Intuity Aortic Valve used;  Surgeon: Purcell Nails, MD;  Location: [facility] OR;  Service: Open Heart Surgery;  Laterality: N/A;   BLADDER SUSPENSION  2007   BREAST ENHANCEMENT SURGERY  1980   CARPAL TUNNEL RELEASE Bilateral    COLONOSCOPY     DORSAL COMPARTMENT RELEASE Right 09/15/2014   Procedure: RIGHT WRIST DEQUERVAINS RELEASE ;   Surgeon: Mckinley Jewel, MD;  Location: [facility];  Service: Orthopedics;  Laterality: Right;   DRUG INDUCED ENDOSCOPY N/A 02/19/2022   Procedure: DRUG INDUCED SLEEP ENDOSCOPY;  Surgeon: Osborn Coho, MD;  Location: [facility];  Service: ENT;  Laterality: N/A;   ESOPHAGOGASTRODUODENOSCOPY     EYE SURGERY     cataract surgery   IMPLANTATION OF HYPOGLOSSAL NERVE STIMULATOR Right 04/23/2022   Procedure: IMPLANTATION OF HYPOGLOSSAL NERVE STIMULATOR;  Surgeon: Osborn Coho, MD;  Location: [facility];  Service: ENT;  Laterality: Right;   KNEE ARTHROSCOPY  04/15/2012   Procedure: ARTHROSCOPY KNEE;  Surgeon: Loreta Ave, MD;  Location: [facility] OR;  Service: Orthopedics;  Laterality: Right;   LAPAROSCOPIC CHOLECYSTECTOMY     LASIK     OOPHORECTOMY     PALATE TO GINGIVA GRAFT  2017   REVERSE SHOULDER ARTHROPLASTY Left 02/13/2021   Procedure: REVERSE SHOULDER ARTHROPLASTY;  Surgeon: Teryl Lucy, MD;  Location: [facility];  Service: Orthopedics;  Laterality: Left;   RIGHT/LEFT HEART CATH AND CORONARY ANGIOGRAPHY N/A 08/02/2016   Procedure: Right/Left Heart Cath and Coronary Angiography;  Surgeon: Tonny Bollman, MD;  Location: [facility] INVASIVE CV  LAB;  Service: Cardiovascular;  Laterality: N/A;   ROTATOR CUFF REPAIR Left    TEE WITHOUT CARDIOVERSION N/A 08/23/2016   Procedure: TRANSESOPHAGEAL ECHOCARDIOGRAM (TEE);  Surgeon: Purcell Nails, MD;  Location: [facility] OR;  Service: Open Heart Surgery;  Laterality: N/A;   TOTAL KNEE ARTHROPLASTY  04/15/2012   Procedure: TOTAL KNEE ARTHROPLASTY;  Surgeon: Loreta Ave, MD;  Location: [facility] OR;  Service: Orthopedics;  Laterality: Right;   TRIGGER FINGER RELEASE Right 04/20/2015   Procedure: RIGHT TRIGGER FINGER RELEASE (TENDON SHEALTH INCISION) ;  Surgeon: Loreta Ave, MD;  Location: [facility];  Service: Orthopedics;  Laterality: Right;   Patient Active Problem List   Diagnosis Date Noted   Class 2  severe obesity with serious comorbidity and body mass index (BMI) of 36.0 to 36.9 in adult 05/09/2022   Type 2 diabetes mellitus with obesity 05/09/2022   Hypertension associated with type 2 diabetes mellitus 05/09/2022   SOB (shortness of breath) 12/10/2021   S/P reverse total shoulder arthroplasty, left 02/13/2021   Idiopathic hyperphosphatasia 08/03/2019   Vitamin D deficiency 06/04/2018   Hyponatremia 02/17/2018   History of CVA in adulthood 08/13/2017   Blurry vision 08/13/2017   Primary hypertension 01/02/2017   S/P minimally invasive aortic valve replacement with bioprosthetic valve 08/23/2016   Diastolic dysfunction    Osteopenia 05/27/2016   OSA (obstructive sleep apnea) 02/27/2016   Bruxism 10/23/2015   Elevated alkaline phosphatase level 05/16/2015   Atypical chest pain 11/10/2014   Acne 05/12/2014   Diabetes mellitus 05/12/2014   GERD (gastroesophageal reflux disease) 03/10/2014   Generalized anxiety disorder 03/10/2014   Migraines 03/10/2014   EUSTACHIAN TUBE DYSFUNCTION, CHRONIC 06/14/2010   Irritable bowel syndrome 12/15/2008   Low back pain 08/11/2008   Hyperlipidemia associated with type 2 diabetes mellitus 02/09/2008   Allergic rhinitis 02/12/2007   Asthma 02/12/2007    PCP: Tana Conch O,MD  REFERRING PROVIDER: Teryl Lucy, MD  REFERRING DIAG: Rt Shoulder impingement   THERAPY DIAG:  Stiffness of right shoulder, not elsewhere classified  Impingement of right shoulder  Muscle weakness (generalized)  Rationale for Evaluation and Treatment: Rehabilitation  ONSET DATE: 08/19/22  SUBJECTIVE:                                                                                                                                                                                      SUBJECTIVE STATEMENT: Patient arrived early, feeling poorly, as if her BS had dropped. She ate a snack and flet able to participate once her treatment time arrived.  PERTINENT  HISTORY: L TSR 2022, R TKR 2013, heart valve replacement, just returned to Dr with good  report.  PAIN:  Are you having pain? Yes: NPRS scale: 5/10 Pain location: R shoulder and proximal arm Pain description: sharp Aggravating factors: Moves certain ways Relieving factors: Not using her arm, don't sleep on R.  PRECAUTIONS: None  WEIGHT BEARING RESTRICTIONS: No  FALLS:  Has patient fallen in last 6 months? No  LIVING ENVIRONMENT: Lives with: lives with their spouse Lives in: House/apartment Stairs: Yes: External: 5 steps; on left going up Has following equipment at home: None  OCCUPATION: Retired. Trying to lose weight, so wants/needs to exercise more, but the shoulder pain limits her ability to exercise.  PLOF: Independent  PATIENT GOALS:Decrease pain. Return to using her R arm wihtout pain.  NEXT MD VISIT: 09/16/22  OBJECTIVE:   DIAGNOSTIC FINDINGS:  X ray 2/27/24showed preservation of the bony architecure of the R shoulder   SURVEYS:  FOTO 48  COGNITION: Overall cognitive status: Within functional limits for tasks assessed     SENSATION: WFL  POSTURE: Slightly forward head, R shoulder rounded forward and elevated, flattened spinal curves, mildly winging scapulae  UPPER EXTREMITY ROM: L WFL, Active elevation mildly limited. Elbow and wrist WFL B.  Active ROM Right eval R 09/13/22  09/19/22 standing   Shoulder flexion 102 139 153  Shoulder extension 102 102 130  Shoulder abduction     Shoulder adduction     Shoulder internal rotation WNL 60 65  Shoulder external rotation 32 75 75  Elbow flexion     Elbow extension     Wrist flexion     Wrist extension     Wrist ulnar deviation     Wrist radial deviation     Wrist pronation     Wrist supination     (Blank rows = not tested)  UPPER EXTREMITY MMT:  MMT Right eval Left eval RT 09/19/22  Shoulder flexion   4/5  Shoulder extension     Shoulder abduction   4-/5 with pain  Shoulder adduction      Shoulder internal rotation   4/5  Shoulder external rotation   4/5 with pain  Middle trapezius     Lower trapezius     Elbow flexion     Elbow extension     Wrist flexion     Wrist extension     Wrist ulnar deviation     Wrist radial deviation     Wrist pronation     Wrist supination     Grip strength (lbs)     (Blank rows = not tested)  SHOULDER SPECIAL TESTS: Impingement tests: Hawkins/Kennedy impingement test: positive  and Painful arc test: positive  Rotator cuff assessment: Drop arm test: positive , Empty can test: positive , Full can test: positive , and Gerber lift off test: negative Biceps assessment: Speed's test: positive   JOINT MOBILITY TESTING:  R humeral glide limited, most in post glide, also inferior glide.  PALPATION:  TTP R cerv paraspinals, up traps, medial scapular border. Tightness noted in R cerv paraspinals, scalenes, UT, LS, pects. TTP supraspinatus tendon and long head of biceps tendon, R.   TODAY'S TREATMENT:  DATE:  10/11/22 NuStep L5 x 6 minutes STM to R pects, with cross friction to biceps tendon. Reporting pain in the tendon as well as post triceps Seated shoulder horiz abd with chin tuck to hold ball on wall, then B shoulder ER and IR against red tband, 2 x 10 for each Door stretch and ant shoulder stretch 2 x 30 sec each Pendulum exercises for r shoulder with 3# weight, required TC and VC to relax R shoulder Resisted scapular retraction against R tband resistance x 10 reps CP to R shoulder for anti inflammation.  10/09/22 NuStep L3 x 6 minutes STM to supraspinatus, UT, cerv paraspinals, pects on R F/B joint mobs for humeral depression and post glide, then stretch to same muscles. Standing towel slides on mat into flex and abd with R, 10 reps each Seated shoulder horizontal abd against red tband x 10 Seated  shoulder flex with 2# WATE bar x 10 each, ext, abd CP to R shoulder for pain relief, anti inflammatory control.  09/25/22 NuStep L5 x 6 minutes STM- supraspinatus tendon, bicep tendon Joint mobs-humeral glides, inferior, post, ant, scapular mobs, grade lll2 x 10 each Stretch into shoulder flex, abd, ER Nerve glides in stand, hand on wall and rotate away. Standing shoulder ext, rows, ER with g tband, 2 x 10 reps Wall slides, keeping shoulde < 90 flexion, 12 reps CP to R shoulder x 10 minutes for inflammation.  09/19/22 Nustep L 5 7 min 2# upper cut 2 sets 10 2# hitch hiker 2 sets 10 2# empty can 2 sets 10 Rows and Ext red 2x10 RT Triceps Ext 15lb 2x15 UE ER/IR yellow Tband 2x10 Each ROM and MMT- see above SL abd and ER 2# 10 x 2 sets Supine 2# flex and chest press 2 sets 10 PROM for R shoulder flex abd, ER CP to R shoulder x 10 minutes for inflammation.   09/17/22 NuStep L5 x 6 min RUE ER/IR yellow Tband 2x10 Each Rows and Ext red 2x10 Triceps Ext 15lb 2x15 AAROM Flex, Ext, IR up back 2lb WaTE Flex, abd, circles on wall RUE x10 each  Yellow band 3 way RUE 2x5 each PROM for R shoulder flex abd, ER CP to R shoulder x 10 minutes for inflammation.   09/13/22 AAROM Flex, Ext, IR up back  Biceps curls 2lb dumbbells x10 Triceps Ext 15lb 2x10 NuStep L5 x 6 min RUE ER/IR yellow Tband 2x10 Each PROM for R shoulder flex abd, ER CP to R shoulder x 10 minutes for inflammation.    09/11/22 NuStep L 4 x 6 min AAROM Flex, Ext, IR up back  Seated Rows yellow 2x10 Shoulder Ext Yellow 2x10 RUE ER/IR yellow, very weak with ER Triceps Ext 15lb 2x10, biceps curls 5lb x10 Biceps curls 2lb dumbbells x10 Pendulum exercises with 3# weight, deep breathing to increase stretch STM to cervical muscles and up traps, rhomboids  PROM for R shoulder flex abd, ER CP to R shoulder x 10 minutes for inflammation.   PATIENT EDUCATION: Education details: POC Person educated: Patient Education method:  Medical illustrator Education comprehension: verbalized understanding  HOME EXERCISE PROGRAM: [id number]  ASSESSMENT:  CLINICAL IMPRESSION: Pt reports that her shoulder pain remains unchanged. It radiates into prox tricep. Bicep tendon also TTP. Provided STM to biceps tendon, scapular mobs, humeral depression, and facilitated active shoulder stability exercises.  OBJECTIVE IMPAIRMENTS: decreased activity tolerance, decreased coordination, decreased ROM, decreased strength, impaired flexibility, impaired UE functional use, improper body mechanics, postural dysfunction, and  pain.   ACTIVITY LIMITATIONS: carrying, lifting, sleeping, reach over head, and hygiene/grooming  PARTICIPATION LIMITATIONS: cleaning, laundry, community activity, and yard work  PERSONAL FACTORS: Past/current experiences are also affecting patient's functional outcome.   REHAB POTENTIAL: Good  CLINICAL DECISION MAKING: Stable/uncomplicated  EVALUATION COMPLEXITY: low   GOALS: Goals reviewed with patient? Yes  SHORT TERM GOALS: Target date: 09/09/22  I with initial HEP Baseline: Goal status:09/04/22-Initial program provided.  09/19/22 MET  LONG TERM GOALS: Target date: 11/20/22  I with final HEP Baseline:  Goal status: 10/11/22-ongoing  2.  Patient will perform AROM of R shoulder at least 120 elevation, with<3/10 pain. Baseline: 102, with increased pain Goal status: PROM WNL, partly met with flexion 10/11/22 but still having pain with active elevation  3.  Increase R shoulder strength to at least 4-/5 Baseline: Unable to tolerate resistance due to pain Goal status: 10/09/22-ongoing 3+/5  4.  Increase FOTO score to at least 61 Baseline: 48 Goal status: INITIAL  5.  Patient will be able to manage styling her hair and putting on her bra with R shoulder pain < 3/10 Baseline: 8/10 Goal status: Progressing 6/10 10/09/22  PLAN:  PT FREQUENCY: 1-2x/week  PT DURATION: 12 weeks  PLANNED  INTERVENTIONS: Therapeutic exercises, Therapeutic activity, Neuromuscular re-education, Balance training, Gait training, Patient/Family education, Self Care, Joint mobilization, Dry Needling, Electrical stimulation, Cryotherapy, Moist heat, Taping, Ionotophoresis /ml Dexamethasone, and Manual therapy  PLAN FOR NEXT SESSION: progress strength and ROM. Awaiting cat scan  Patient Details  Name: NNEOMA HARRAL MRN: [id number] Date of Birth: 1950-08-22 Referring Provider:  Shelva Majestic, MD  Iona Beard, DPT  [facility] [address] [place], [place], [address] Phone: [phone]   Fax:  [phone]

## 2022-10-14 ENCOUNTER — Encounter: Payer: Self-pay | Admitting: Neurology

## 2022-10-14 ENCOUNTER — Ambulatory Visit: Payer: Medicare PPO | Admitting: Neurology

## 2022-10-14 VITALS — BP 151/78 | HR 83 | Ht 66.0 in | Wt 200.0 lb

## 2022-10-14 DIAGNOSIS — Z8673 Personal history of transient ischemic attack (TIA), and cerebral infarction without residual deficits: Secondary | ICD-10-CM | POA: Diagnosis not present

## 2022-10-14 DIAGNOSIS — E1169 Type 2 diabetes mellitus with other specified complication: Secondary | ICD-10-CM | POA: Diagnosis not present

## 2022-10-14 DIAGNOSIS — E785 Hyperlipidemia, unspecified: Secondary | ICD-10-CM | POA: Diagnosis not present

## 2022-10-14 DIAGNOSIS — G43109 Migraine with aura, not intractable, without status migrainosus: Secondary | ICD-10-CM

## 2022-10-14 DIAGNOSIS — I10 Essential (primary) hypertension: Secondary | ICD-10-CM | POA: Diagnosis not present

## 2022-10-14 DIAGNOSIS — E669 Obesity, unspecified: Secondary | ICD-10-CM

## 2022-10-15 ENCOUNTER — Ambulatory Visit: Payer: Medicare PPO | Admitting: Physical Therapy

## 2022-10-15 ENCOUNTER — Encounter: Payer: Self-pay | Admitting: Physical Therapy

## 2022-10-15 DIAGNOSIS — M6281 Muscle weakness (generalized): Secondary | ICD-10-CM | POA: Diagnosis not present

## 2022-10-15 DIAGNOSIS — M25811 Other specified joint disorders, right shoulder: Secondary | ICD-10-CM | POA: Diagnosis not present

## 2022-10-15 DIAGNOSIS — M25611 Stiffness of right shoulder, not elsewhere classified: Secondary | ICD-10-CM

## 2022-10-15 DIAGNOSIS — M25612 Stiffness of left shoulder, not elsewhere classified: Secondary | ICD-10-CM | POA: Diagnosis not present

## 2022-10-15 NOTE — Therapy (Signed)
OUTPATIENT PHYSICAL THERAPY SHOULDER TREATMENT   PHYSICAL THERAPY DISCHARGE SUMMARY  Visits from Start of Care: 11  Current functional level related to goals / functional outcomes: Pain and ROM limitations are improved, but still present   Remaining deficits: Pain, decreased ROM, decreased strength   Education / Equipment: HEP   Patient agrees to discharge. Patient goals were partially met. Patient is being discharged due to maximized rehab potential.    See note below for Objective Data and Assessment of Progress/Goals.      Patient Name: [name] MRN: [id number] DOB:1950-12-20, 72 y.o., female Today's Date: 10/15/2022  END OF SESSION:  PT End of Session - 10/15/22 1226     Visit Number 11    Date for PT Re-Evaluation 11/20/22    PT Start Time 1223    PT Stop Time 1305    PT Time Calculation (min) 42 min    Activity Tolerance Patient limited by pain    Behavior During Therapy [facility] for tasks assessed/performed              Past Medical History:  Diagnosis Date   Allergy    Anxiety    Arthritis    back & knee   Asthma     mild per pt shows up with resp illness   Colon polyps    Constipation    Diabetes mellitus without complication    Gallstones    Gastric polyps    Gastroparesis    GERD (gastroesophageal reflux disease)    Headache    sinus headaches and migraines at times   Heart murmur    History of migraine headaches    HTN (hypertension)    Hyperlipidemia    IBS (irritable bowel syndrome)    Joint pain    Lumbar disc disease    PONV (postoperative nausea and vomiting)    S/P aortic valve replacement with bioprosthetic valve 08/23/2016   25 mm Edwards Intuity rapid-deployment bovine pericardial tissue valve via partial upper mini sternotomy   Sleep apnea    does not use CPAP   TIA (transient ischemic attack)    TIA (transient ischemic attack)    hx of per pt   Past Surgical History:  Procedure Laterality Date   ABDOMINAL  HYSTERECTOMY     AORTIC VALVE REPLACEMENT N/A 08/23/2016   Procedure: AORTIC VALVE REPLACEMENT (AVR) - using partial Upper Sternotomy- 25mm Edwards Intuity Aortic Valve used;  Surgeon: Purcell Nails, MD;  Location: [facility] OR;  Service: Open Heart Surgery;  Laterality: N/A;   BLADDER SUSPENSION  2007   BREAST ENHANCEMENT SURGERY  1980   CARPAL TUNNEL RELEASE Bilateral    COLONOSCOPY     DORSAL COMPARTMENT RELEASE Right 09/15/2014   Procedure: RIGHT WRIST DEQUERVAINS RELEASE ;  Surgeon: Mckinley Jewel, MD;  Location: [facility];  Service: Orthopedics;  Laterality: Right;   DRUG INDUCED ENDOSCOPY N/A 02/19/2022   Procedure: DRUG INDUCED SLEEP ENDOSCOPY;  Surgeon: Osborn Coho, MD;  Location: [facility];  Service: ENT;  Laterality: N/A;   ESOPHAGOGASTRODUODENOSCOPY     EYE SURGERY     cataract surgery   IMPLANTATION OF HYPOGLOSSAL NERVE STIMULATOR Right 04/23/2022   Procedure: IMPLANTATION OF HYPOGLOSSAL NERVE STIMULATOR;  Surgeon: Osborn Coho, MD;  Location: [facility];  Service: ENT;  Laterality: Right;   KNEE ARTHROSCOPY  04/15/2012   Procedure: ARTHROSCOPY KNEE;  Surgeon: Loreta Ave, MD;  Location: [facility] OR;  Service: Orthopedics;  Laterality: Right;  LAPAROSCOPIC CHOLECYSTECTOMY     LASIK     OOPHORECTOMY     PALATE TO GINGIVA GRAFT  2017   REVERSE SHOULDER ARTHROPLASTY Left 02/13/2021   Procedure: REVERSE SHOULDER ARTHROPLASTY;  Surgeon: Teryl Lucy, MD;  Location: [facility];  Service: Orthopedics;  Laterality: Left;   RIGHT/LEFT HEART CATH AND CORONARY ANGIOGRAPHY N/A 08/02/2016   Procedure: Right/Left Heart Cath and Coronary Angiography;  Surgeon: Tonny Bollman, MD;  Location: [facility] INVASIVE CV LAB;  Service: Cardiovascular;  Laterality: N/A;   ROTATOR CUFF REPAIR Left    TEE WITHOUT CARDIOVERSION N/A 08/23/2016   Procedure: TRANSESOPHAGEAL ECHOCARDIOGRAM (TEE);  Surgeon: Purcell Nails, MD;  Location: [facility] OR;  Service: Open Heart Surgery;   Laterality: N/A;   TOTAL KNEE ARTHROPLASTY  04/15/2012   Procedure: TOTAL KNEE ARTHROPLASTY;  Surgeon: Loreta Ave, MD;  Location: [facility] OR;  Service: Orthopedics;  Laterality: Right;   TRIGGER FINGER RELEASE Right 04/20/2015   Procedure: RIGHT TRIGGER FINGER RELEASE (TENDON SHEALTH INCISION) ;  Surgeon: Loreta Ave, MD;  Location: [facility];  Service: Orthopedics;  Laterality: Right;   Patient Active Problem List   Diagnosis Date Noted   Class 2 severe obesity with serious comorbidity and body mass index (BMI) of 36.0 to 36.9 in adult 05/09/2022   Type 2 diabetes mellitus with obesity 05/09/2022   Hypertension associated with type 2 diabetes mellitus 05/09/2022   SOB (shortness of breath) 12/10/2021   S/P reverse total shoulder arthroplasty, left 02/13/2021   Idiopathic hyperphosphatasia 08/03/2019   Vitamin D deficiency 06/04/2018   Hyponatremia 02/17/2018   History of CVA in adulthood 08/13/2017   Blurry vision 08/13/2017   Primary hypertension 01/02/2017   S/P minimally invasive aortic valve replacement with bioprosthetic valve 08/23/2016   Diastolic dysfunction    Osteopenia 05/27/2016   OSA (obstructive sleep apnea) 02/27/2016   Bruxism 10/23/2015   Elevated alkaline phosphatase level 05/16/2015   Atypical chest pain 11/10/2014   Acne 05/12/2014   Diabetes mellitus 05/12/2014   GERD (gastroesophageal reflux disease) 03/10/2014   Generalized anxiety disorder 03/10/2014   Migraines 03/10/2014   EUSTACHIAN TUBE DYSFUNCTION, CHRONIC 06/14/2010   Irritable bowel syndrome 12/15/2008   Low back pain 08/11/2008   Hyperlipidemia associated with type 2 diabetes mellitus 02/09/2008   Allergic rhinitis 02/12/2007   Asthma 02/12/2007    PCP: Tana Conch O,MD  REFERRING PROVIDER: Teryl Lucy, MD  REFERRING DIAG: Rt Shoulder impingement   THERAPY DIAG:  Stiffness of right shoulder, not elsewhere classified  Impingement of right shoulder  Muscle  weakness (generalized)  Rationale for Evaluation and Treatment: Rehabilitation  ONSET DATE: 08/19/22  SUBJECTIVE:  SUBJECTIVE STATEMENT: Patient arrived early, feeling poorly, as if her BS had dropped. She ate a snack and flet able to participate once her treatment time arrived.  PERTINENT HISTORY: L TSR 2022, R TKR 2013, heart valve replacement, just returned to Dr with good report.  PAIN:  Are you having pain? Yes: NPRS scale: 5/10 Pain location: R shoulder and proximal arm Pain description: sharp Aggravating factors: Moves certain ways Relieving factors: Not using her arm, don't sleep on R.  PRECAUTIONS: None  WEIGHT BEARING RESTRICTIONS: No  FALLS:  Has patient fallen in last 6 months? No  LIVING ENVIRONMENT: Lives with: lives with their spouse Lives in: House/apartment Stairs: Yes: External: 5 steps; on left going up Has following equipment at home: None  OCCUPATION: Retired. Trying to lose weight, so wants/needs to exercise more, but the shoulder pain limits her ability to exercise.  PLOF: Independent  PATIENT GOALS:Decrease pain. Return to using her R arm wihtout pain.  NEXT MD VISIT: 09/16/22  OBJECTIVE:   DIAGNOSTIC FINDINGS:  X ray 2/27/24showed preservation of the bony architecure of the R shoulder   SURVEYS:  FOTO 48  COGNITION: Overall cognitive status: Within functional limits for tasks assessed     SENSATION: WFL  POSTURE: Slightly forward head, R shoulder rounded forward and elevated, flattened spinal curves, mildly winging scapulae  UPPER EXTREMITY ROM: L WFL, Active elevation mildly limited. Elbow and wrist WFL B.  Active ROM Right eval R 09/13/22  09/19/22 standing   Shoulder flexion 102 139 153  Shoulder extension 102 102 130  Shoulder abduction      Shoulder adduction     Shoulder internal rotation WNL 60 65  Shoulder external rotation 32 75 75  Elbow flexion     Elbow extension     Wrist flexion     Wrist extension     Wrist ulnar deviation     Wrist radial deviation     Wrist pronation     Wrist supination     (Blank rows = not tested)  UPPER EXTREMITY MMT:  MMT Right eval Left eval RT 09/19/22  Shoulder flexion   4/5  Shoulder extension     Shoulder abduction   4-/5 with pain  Shoulder adduction     Shoulder internal rotation   4/5  Shoulder external rotation   4/5 with pain  Middle trapezius     Lower trapezius     Elbow flexion     Elbow extension     Wrist flexion     Wrist extension     Wrist ulnar deviation     Wrist radial deviation     Wrist pronation     Wrist supination     Grip strength (lbs)     (Blank rows = not tested)  SHOULDER SPECIAL TESTS: Impingement tests: Hawkins/Kennedy impingement test: positive  and Painful arc test: positive  Rotator cuff assessment: Drop arm test: positive , Empty can test: positive , Full can test: positive , and Gerber lift off test: negative Biceps assessment: Speed's test: positive   JOINT MOBILITY TESTING:  R humeral glide limited, most in post glide, also inferior glide.  PALPATION:  TTP R cerv paraspinals, up traps, medial scapular border. Tightness noted in R cerv paraspinals, scalenes, UT, LS, pects. TTP supraspinatus tendon and long head of biceps tendon, R.   TODAY'S TREATMENT:  DATE:  10/15/22 NuStep L5 x 6 minutes. STM to R pect, UT with PROM in all planes for R shoulder. R shoulder inf glide with towel under arm Single arm doorway stretch for RUE. Seated B shoulder depression on mat behind trunk-10 x 5 sec B wall slides with towel, 10 flex, 10 R scaption.  10/11/22 NuStep L5 x 6 minutes STM to R pects, with cross  friction to biceps tendon. Reporting pain in the tendon as well as post triceps Seated shoulder horiz abd with chin tuck to hold ball on wall, then B shoulder ER and IR against red tband, 2 x 10 for each Door stretch and ant shoulder stretch 2 x 30 sec each Pendulum exercises for r shoulder with 3# weight, required TC and VC to relax R shoulder Resisted scapular retraction against R tband resistance x 10 reps CP to R shoulder for anti inflammation.  10/09/22 NuStep L3 x 6 minutes STM to supraspinatus, UT, cerv paraspinals, pects on R F/B joint mobs for humeral depression and post glide, then stretch to same muscles. Standing towel slides on mat into flex and abd with R, 10 reps each Seated shoulder horizontal abd against red tband x 10 Seated shoulder flex with 2# WATE bar x 10 each, ext, abd CP to R shoulder for pain relief, anti inflammatory control.  09/25/22 NuStep L5 x 6 minutes STM- supraspinatus tendon, bicep tendon Joint mobs-humeral glides, inferior, post, ant, scapular mobs, grade lll2 x 10 each Stretch into shoulder flex, abd, ER Nerve glides in stand, hand on wall and rotate away. Standing shoulder ext, rows, ER with g tband, 2 x 10 reps Wall slides, keeping shoulde < 90 flexion, 12 reps CP to R shoulder x 10 minutes for inflammation.  09/19/22 Nustep L 5 7 min 2# upper cut 2 sets 10 2# hitch hiker 2 sets 10 2# empty can 2 sets 10 Rows and Ext red 2x10 RT Triceps Ext 15lb 2x15 UE ER/IR yellow Tband 2x10 Each ROM and MMT- see above SL abd and ER 2# 10 x 2 sets Supine 2# flex and chest press 2 sets 10 PROM for R shoulder flex abd, ER CP to R shoulder x 10 minutes for inflammation.   09/17/22 NuStep L5 x 6 min RUE ER/IR yellow Tband 2x10 Each Rows and Ext red 2x10 Triceps Ext 15lb 2x15 AAROM Flex, Ext, IR up back 2lb WaTE Flex, abd, circles on wall RUE x10 each  Yellow band 3 way RUE 2x5 each PROM for R shoulder flex abd, ER CP to R shoulder x 10 minutes for  inflammation.   09/13/22 AAROM Flex, Ext, IR up back  Biceps curls 2lb dumbbells x10 Triceps Ext 15lb 2x10 NuStep L5 x 6 min RUE ER/IR yellow Tband 2x10 Each PROM for R shoulder flex abd, ER CP to R shoulder x 10 minutes for inflammation.    09/11/22 NuStep L 4 x 6 min AAROM Flex, Ext, IR up back  Seated Rows yellow 2x10 Shoulder Ext Yellow 2x10 RUE ER/IR yellow, very weak with ER Triceps Ext 15lb 2x10, biceps curls 5lb x10 Biceps curls 2lb dumbbells x10 Pendulum exercises with 3# weight, deep breathing to increase stretch STM to cervical muscles and up traps, rhomboids  PROM for R shoulder flex abd, ER CP to R shoulder x 10 minutes for inflammation.   PATIENT EDUCATION: Education details: POC Person educated: Patient Education method: Medical illustrator Education comprehension: verbalized understanding  HOME EXERCISE PROGRAM: [id number]  ASSESSMENT:  CLINICAL IMPRESSION: Pt  has a CT scan scheduled in the next week. She has partially met her goals, continues to have some pain in the shoulder with elevation. She appears to have met max gains with PT for the shoulder impingement. If the CT scan indicates, her Dr will order additional therapy.  OBJECTIVE IMPAIRMENTS: decreased activity tolerance, decreased coordination, decreased ROM, decreased strength, impaired flexibility, impaired UE functional use, improper body mechanics, postural dysfunction, and pain.   ACTIVITY LIMITATIONS: carrying, lifting, sleeping, reach over head, and hygiene/grooming  PARTICIPATION LIMITATIONS: cleaning, laundry, community activity, and yard work  PERSONAL FACTORS: Past/current experiences are also affecting patient's functional outcome.   REHAB POTENTIAL: Good  CLINICAL DECISION MAKING: Stable/uncomplicated  EVALUATION COMPLEXITY: low   GOALS: Goals reviewed with patient? Yes  SHORT TERM GOALS: Target date: 09/09/22  I with initial HEP Baseline: Goal  status:09/04/22-Initial program provided.  09/19/22 MET  LONG TERM GOALS: Target date: 11/20/22  I with final HEP Baseline:  Goal status: 10/15/22-met  2.  Patient will perform AROM of R shoulder at least 120 elevation, with<3/10 pain. Baseline: 102, with increased pain Goal status: PROM WNL, partly met with flexion 10/15/22, 110 PROM, but still having pain with active elevation  3.  Increase R shoulder strength to at least 4-/5 Baseline: Unable to tolerate resistance due to pain Goal status: 10/16/22- not met 3+/5  4.  Increase FOTO score to at least 61 Baseline: 48 Goal status: 47, not met  5.  Patient will be able to manage styling her hair and putting on her bra with R shoulder pain < 3/10 Baseline: 8/10 Goal status:  Pain up to 5/10 when styling her hair 10/15/22 not met  PLAN:  PT FREQUENCY: 1-2x/week  PT DURATION: 12 weeks  PLANNED INTERVENTIONS: Therapeutic exercises, Therapeutic activity, Neuromuscular re-education, Balance training, Gait training, Patient/Family education, Self Care, Joint mobilization, Dry Needling, Electrical stimulation, Cryotherapy, Moist heat, Taping, Ionotophoresis /ml Dexamethasone, and Manual therapy  PLAN FOR NEXT SESSION: progress strength and ROM. Awaiting cat scan  Patient Details  Name: [name] MRN: [id number] Date of Birth: 1950-07-21 Referring Provider:  Shelva Majestic, MD  Iona Beard, DPT  [facility] [address] [place], [place], [address] Phone: [phone]   Fax:  [phone]

## 2022-10-16 ENCOUNTER — Other Ambulatory Visit: Payer: Medicare PPO

## 2022-10-17 ENCOUNTER — Other Ambulatory Visit: Payer: Self-pay | Admitting: Family Medicine

## 2022-10-17 ENCOUNTER — Ambulatory Visit
Admission: RE | Admit: 2022-10-17 | Discharge: 2022-10-17 | Disposition: A | Discharge status: Home / Self Care | Payer: Medicare PPO | Source: Ambulatory Visit | Attending: Orthopedic Surgery | Admitting: Orthopedic Surgery

## 2022-10-17 ENCOUNTER — Other Ambulatory Visit: Payer: Self-pay

## 2022-10-17 DIAGNOSIS — E871 Hypo-osmolality and hyponatremia: Secondary | ICD-10-CM

## 2022-10-17 DIAGNOSIS — M75100 Unspecified rotator cuff tear or rupture of unspecified shoulder, not specified as traumatic: Secondary | ICD-10-CM

## 2022-10-17 DIAGNOSIS — M75121 Complete rotator cuff tear or rupture of right shoulder, not specified as traumatic: Secondary | ICD-10-CM | POA: Diagnosis not present

## 2022-10-17 MED ORDER — METHYLPREDNISOLONE ACETATE 40 MG/ML INJ SUSP (RADIOLOG
80.0000 mg | Freq: Once | INTRAMUSCULAR | Status: AC
  Administered 2022-10-17: 80 mg via INTRA_ARTICULAR

## 2022-10-17 MED ORDER — IOPAMIDOL (ISOVUE-M 200) INJECTION 41%
20.0000 mL | Freq: Once | INTRAMUSCULAR | Status: AC
  Administered 2022-10-17: 20 mL via INTRA_ARTICULAR

## 2022-10-17 MED ORDER — DEMECLOCYCLINE HCL 300 MG PO TABS
300.0000 mg | ORAL_TABLET | Freq: Two times a day (BID) | ORAL | 3 refills | Status: DC
Start: 2022-10-17 — End: 2023-07-10

## 2022-10-18 DIAGNOSIS — G4730 Sleep apnea, unspecified: Secondary | ICD-10-CM | POA: Diagnosis not present

## 2022-10-23 DIAGNOSIS — D2262 Melanocytic nevi of left upper limb, including shoulder: Secondary | ICD-10-CM | POA: Diagnosis not present

## 2022-10-23 DIAGNOSIS — D2371 Other benign neoplasm of skin of right lower limb, including hip: Secondary | ICD-10-CM | POA: Diagnosis not present

## 2022-10-23 DIAGNOSIS — D2272 Melanocytic nevi of left lower limb, including hip: Secondary | ICD-10-CM | POA: Diagnosis not present

## 2022-10-23 DIAGNOSIS — D225 Melanocytic nevi of trunk: Secondary | ICD-10-CM | POA: Diagnosis not present

## 2022-10-23 DIAGNOSIS — D2271 Melanocytic nevi of right lower limb, including hip: Secondary | ICD-10-CM | POA: Diagnosis not present

## 2022-10-23 DIAGNOSIS — L814 Other melanin hyperpigmentation: Secondary | ICD-10-CM | POA: Diagnosis not present

## 2022-10-25 ENCOUNTER — Telehealth: Payer: Self-pay | Admitting: *Deleted

## 2022-10-25 DIAGNOSIS — S46011D Strain of muscle(s) and tendon(s) of the rotator cuff of right shoulder, subsequent encounter: Secondary | ICD-10-CM | POA: Diagnosis not present

## 2022-10-25 NOTE — Telephone Encounter (Signed)
   Pre-operative Risk Assessment    Patient Name: [name]  DOB: [birth year] MRN: [id number]      Request for Surgical Clearance    Procedure:   RIGHT SHOULDER SCOPE, RCR  Date of Surgery:  Clearance TBD                                 Surgeon:  DR. Teryl Lucy Surgeon's Group or Practice Name:  [facility] Phone number:  [phone] X [phone] Fax number:  [phone]   Type of Clearance Requested:   - Medical  - Pharmacy:  Hold Clopidogrel (Plavix)     Type of Anesthesia:  General    Additional requests/questions:    Elpidio Anis   10/25/2022, 5:31 PM

## 2022-10-28 ENCOUNTER — Encounter: Payer: Self-pay | Admitting: Cardiology

## 2022-10-28 NOTE — Telephone Encounter (Signed)
   Name: [name]  DOB: October 06, 1950  MRN: [id number]   Primary Cardiologist: Meriam Sprague, MD  Chart reviewed as part of pre-operative protocol coverage. [name] was last seen on 09/19/2022 by Dr. Shari Prows.  She was doing well at that time.  Per Dr. Shari Prows, no further cardiac testing required prior to right shoulder scope.  Therefore, based on ACC/AHA guidelines, the patient would be at acceptable risk for the planned procedure without further cardiovascular testing.   Per office protocol, he may hold Plavix for 5-7 days prior to procedure and should resume as soon as hemodynamically stable postoperatively.   I will route this recommendation to the requesting party via Epic fax function and remove from pre-op pool. Please call with questions.  Carlos Levering, NP 10/28/2022, 11:59 AM

## 2022-10-28 NOTE — Telephone Encounter (Signed)
Dr. Shari Prows,   You saw this patient on 09/19/2022. Will you please comment on medical clearance for right shoulder scope?  Please route your response to P CV DIV Preop. I will communicate with requesting office once you have given recommendations.   Thank you!  Carlos Levering, NP

## 2022-10-31 ENCOUNTER — Other Ambulatory Visit (INDEPENDENT_AMBULATORY_CARE_PROVIDER_SITE_OTHER): Payer: Medicare PPO

## 2022-10-31 DIAGNOSIS — D649 Anemia, unspecified: Secondary | ICD-10-CM | POA: Diagnosis not present

## 2022-11-01 ENCOUNTER — Other Ambulatory Visit: Payer: Medicare PPO

## 2022-11-01 LAB — CBC WITH DIFFERENTIAL/PLATELET
Basophils Absolute: 0.1 10*3/uL (ref 0.0–0.1)
Basophils Relative: 0.9 % (ref 0.0–3.0)
Eosinophils Absolute: 0.1 10*3/uL (ref 0.0–0.7)
Eosinophils Relative: 0.8 % (ref 0.0–5.0)
HCT: 35 % — ABNORMAL LOW (ref 36.0–46.0)
Hemoglobin: 11 g/dL — ABNORMAL LOW (ref 12.0–15.0)
Lymphocytes Relative: 23.2 % (ref 12.0–46.0)
Lymphs Abs: 2.7 10*3/uL (ref 0.7–4.0)
MCHC: 31.6 g/dL (ref 30.0–36.0)
MCV: 78.9 fl (ref 78.0–100.0)
Monocytes Absolute: 1 10*3/uL (ref 0.1–1.0)
Monocytes Relative: 8.7 % (ref 3.0–12.0)
Neutro Abs: 7.7 10*3/uL (ref 1.4–7.7)
Neutrophils Relative %: 66.4 % (ref 43.0–77.0)
Platelets: 386 10*3/uL (ref 150.0–400.0)
RBC: 4.44 Mil/uL (ref 3.87–5.11)
RDW: 16.7 % — ABNORMAL HIGH (ref 11.5–15.5)
WBC: 11.6 10*3/uL — ABNORMAL HIGH (ref 4.0–10.5)

## 2022-11-03 ENCOUNTER — Encounter (INDEPENDENT_AMBULATORY_CARE_PROVIDER_SITE_OTHER): Payer: Medicare PPO

## 2022-11-03 ENCOUNTER — Telehealth: Payer: Self-pay | Admitting: Pulmonary Disease

## 2022-11-03 DIAGNOSIS — G4733 Obstructive sleep apnea (adult) (pediatric): Secondary | ICD-10-CM

## 2022-11-03 NOTE — Telephone Encounter (Signed)
HST showed severe  OSA with AHI 33/ hr & low sat of 81%  Please obtain inspire download for me to confirm that device was on the night of study Please arrange appt with inspire rep present.

## 2022-11-05 ENCOUNTER — Ambulatory Visit (INDEPENDENT_AMBULATORY_CARE_PROVIDER_SITE_OTHER): Payer: Medicare PPO | Admitting: Adult Health

## 2022-11-05 ENCOUNTER — Ambulatory Visit (INDEPENDENT_AMBULATORY_CARE_PROVIDER_SITE_OTHER): Payer: Medicare PPO

## 2022-11-05 DIAGNOSIS — D649 Anemia, unspecified: Secondary | ICD-10-CM | POA: Diagnosis not present

## 2022-11-05 LAB — FECAL OCCULT BLOOD, IMMUNOCHEMICAL: Fecal Occult Bld: NEGATIVE

## 2022-11-06 ENCOUNTER — Telehealth (HOSPITAL_BASED_OUTPATIENT_CLINIC_OR_DEPARTMENT_OTHER): Payer: Self-pay | Admitting: Pulmonary Disease

## 2022-11-06 NOTE — Telephone Encounter (Signed)
Patient is wanting to speak with a nurse or Dr. Vassie Loll regarding sleep study. Would not elaborate if it was for results nor any details. Please advise and call patient back.

## 2022-11-08 NOTE — Telephone Encounter (Signed)
HST showed severe  OSA with AHI 33/ hr & low sat of 81%   Please obtain inspire download for me to confirm that device was on the night of study Please arrange appt with inspire rep present.      I spoke with the [name] and notified of results/recs per Dr Vassie Loll. Per other phone note, [name] has contacted Inspire rep to get DL and set up appt for [name]. She is aware to be expecting a phone call. Closing encounter since one already opened for 11/03/22.

## 2022-11-14 NOTE — Telephone Encounter (Signed)
You do not have anything available at [facility] until 01/23/23.  Do you want me to work this into your schedule?  If so - what date/time?

## 2022-11-15 ENCOUNTER — Telehealth: Payer: Self-pay | Admitting: Family Medicine

## 2022-11-15 ENCOUNTER — Encounter: Payer: Self-pay | Admitting: Family Medicine

## 2022-11-15 NOTE — Telephone Encounter (Signed)
Form has been refaxed.

## 2022-11-15 NOTE — Telephone Encounter (Signed)
[name] with [name] states a Surgical Clearance form for Patient was faxed to Dr. Durene Cal on 10/25/22. States they have not received clearance form yet.  Requests to be advised

## 2022-11-26 ENCOUNTER — Encounter (HOSPITAL_BASED_OUTPATIENT_CLINIC_OR_DEPARTMENT_OTHER): Payer: Self-pay | Admitting: Pulmonary Disease

## 2022-11-26 ENCOUNTER — Ambulatory Visit (HOSPITAL_BASED_OUTPATIENT_CLINIC_OR_DEPARTMENT_OTHER): Payer: Medicare PPO | Admitting: Pulmonary Disease

## 2022-11-26 VITALS — BP 118/64 | HR 79 | Ht 66.0 in | Wt 204.8 lb

## 2022-11-26 DIAGNOSIS — G4733 Obstructive sleep apnea (adult) (pediatric): Secondary | ICD-10-CM | POA: Diagnosis not present

## 2022-11-26 NOTE — Progress Notes (Unsigned)
   Subjective:    Patient ID: [name], female    DOB: Sep 26, 1950, 72 y.o.   MRN: [id number]  HPI 72 yo for follow-up of OSA   PMH -  hypertension, diabetes, stroke, diastolic dysfunction and aortic valve replacement, SIADH on demeclocycline   She was unable to tolerate CPAP and underwent implantation of hypoglossal nerve stimulator by Dr. Annalee Genta on 04/23/22.  07/02/22 Device activated  Significant tests/ events reviewed  HST 10/18/22 on Inspire 1.4 V >> AHI 33/h, low sat 81%  Inspire titration 08/2022 She was at 1.8V at entry. Voltage was titrated from 1.6V to 2.6V. Hypopneas persisted at the final level of 2.6 V.  Low sat of 80%   Home sleep study July 2017 AHI 19/hr    HST 09/2021 showed moderate sleep apnea with AHI at 16.5/hour and SPO2 low at 77%   Review of Systems     Objective:   Physical Exam        Assessment & Plan:

## 2022-11-26 NOTE — Patient Instructions (Signed)
You are on level 2 = 0.8 V Increase by 1 level every 2 weeks until you feel discomfort  Call us in mid July to report

## 2022-11-27 NOTE — Assessment & Plan Note (Signed)
Hypoglossal nerve stimulator was reassessed today.  Goal of the follow-up visit was to ensure good compliance, good subjective benefit, good tongue motion and good sense lead waveforms .incision sites appear good, tongue protrusion was examined.  Download was reviewed and usage appears to be up to 12 hours average per night.  Spouse reports that snoring is decreased and she has more energy.  She denies any discomfort.  She was at1.4V Programming :  1.  Stimulation level : We switched her today to electrode B configuration. sensation 0.5 V , functional level 0.7 V lower limit, upper limit 1.2 V.  She was set at level 2 equals 0.8 V. there was good tongue protrusion at this level.  We also checked electrode C configuration but there seem to be some retraction rather than protrusion 2.  Start delay was maintained at 30 minutes, pause time 15 minutes, duration 12 hours 3.  Sensing waveform was analyzed for 3 minutes 4.  Sleep remote education was provided patient demonstrated competency with the remote and was aware of patient Instruction videos and sleep remote guide 5.  Patient was instructed to step up levels by 1 level (0.1 V ) every 2 weeks unless she develops discomfort 6.   She will call us mid July to report and if she has been able to progress we will schedule home sleep test at that level Download was reviewed to confirm good compliance with the device

## 2022-12-17 ENCOUNTER — Other Ambulatory Visit: Payer: Self-pay

## 2022-12-17 ENCOUNTER — Encounter (HOSPITAL_BASED_OUTPATIENT_CLINIC_OR_DEPARTMENT_OTHER): Payer: Self-pay | Admitting: Orthopedic Surgery

## 2022-12-19 ENCOUNTER — Encounter (HOSPITAL_BASED_OUTPATIENT_CLINIC_OR_DEPARTMENT_OTHER)
Admission: RE | Admit: 2022-12-19 | Discharge: 2022-12-19 | Disposition: A | Discharge status: Home / Self Care | Payer: Medicare PPO | Source: Ambulatory Visit | Attending: Orthopedic Surgery | Admitting: Orthopedic Surgery

## 2022-12-19 DIAGNOSIS — M25551 Pain in right hip: Secondary | ICD-10-CM | POA: Diagnosis not present

## 2022-12-19 DIAGNOSIS — E669 Obesity, unspecified: Secondary | ICD-10-CM | POA: Insufficient documentation

## 2022-12-19 DIAGNOSIS — E1169 Type 2 diabetes mellitus with other specified complication: Secondary | ICD-10-CM | POA: Diagnosis not present

## 2022-12-19 DIAGNOSIS — Z01812 Encounter for preprocedural laboratory examination: Secondary | ICD-10-CM | POA: Insufficient documentation

## 2022-12-19 LAB — BASIC METABOLIC PANEL
Anion gap: 8 (ref 5–15)
BUN: 12 mg/dL (ref 8–23)
CO2: 23 mmol/L (ref 22–32)
Calcium: 8.7 mg/dL — ABNORMAL LOW (ref 8.9–10.3)
Chloride: 98 mmol/L (ref 98–111)
Creatinine, Ser: 0.53 mg/dL (ref 0.44–1.00)
GFR, Estimated: >60 mL/min (ref >60)
Glucose, Bld: 103 mg/dL — ABNORMAL HIGH (ref 70–99)
Potassium: 4.1 mmol/L (ref 3.5–5.1)
Sodium: 129 mmol/L — ABNORMAL LOW (ref 135–145)

## 2022-12-23 ENCOUNTER — Encounter (HOSPITAL_BASED_OUTPATIENT_CLINIC_OR_DEPARTMENT_OTHER): Payer: Self-pay | Admitting: Orthopedic Surgery

## 2022-12-23 NOTE — H&P (Signed)
PREOPERATIVE H&P  Chief Complaint: right shoulder rotator cuff tear  HPI: [name] is a 72 y.o. female who presents for preoperative history and physical with a diagnosis of right shoulder rotator cuff tear. She complains of persistent right shoulder pain with overhead activity with weakness.  I have done a left reverse shoulder replacement because she had a bad fall.  She has also had a cardiac valve done in 2018; diabetes A1C of 6.3; and a TIA in 2019.  She has already two injections for the shoulder, neither of which has provided any longstanding relief.  She is frustrated with her persistent symptoms and wants to have it fixed.  She is a nonsmoker.  This is significantly impairing activities of daily living.  She has elected for surgical management.   Past Medical History:  Diagnosis Date   Allergy    Anxiety    Arthritis    back & knee   Asthma     mild per pt shows up with resp illness   Colon polyps    Constipation    Diabetes mellitus without complication (HCC)    Gallstones    Gastric polyps    Gastroparesis    GERD (gastroesophageal reflux disease)    Headache    sinus headaches and migraines at times   Heart murmur    History of migraine headaches    HTN (hypertension)    Hyperlipidemia    IBS (irritable bowel syndrome)    Joint pain    Lumbar disc disease    PONV (postoperative nausea and vomiting)    S/P aortic valve replacement with bioprosthetic valve 08/23/2016   25 mm Edwards Intuity rapid-deployment bovine pericardial tissue valve via partial upper mini sternotomy   Sleep apnea    does not use CPAP   TIA (transient ischemic attack)    TIA (transient ischemic attack)    hx of per pt   Past Surgical History:  Procedure Laterality Date   ABDOMINAL HYSTERECTOMY     AORTIC VALVE REPLACEMENT N/A 08/23/2016   Procedure: AORTIC VALVE REPLACEMENT (AVR) - using partial Upper Sternotomy- 25mm Edwards Intuity Aortic Valve used;  Surgeon: Purcell Nails,  MD;  Location: [facility] OR;  Service: Open Heart Surgery;  Laterality: N/A;   BLADDER SUSPENSION  2007   BREAST ENHANCEMENT SURGERY  1980   CARPAL TUNNEL RELEASE Bilateral    COLONOSCOPY     DORSAL COMPARTMENT RELEASE Right 09/15/2014   Procedure: RIGHT WRIST DEQUERVAINS RELEASE ;  Surgeon: Mckinley Jewel, MD;  Location: [facility];  Service: Orthopedics;  Laterality: Right;   DRUG INDUCED ENDOSCOPY N/A 02/19/2022   Procedure: DRUG INDUCED SLEEP ENDOSCOPY;  Surgeon: Osborn Coho, MD;  Location: [facility];  Service: ENT;  Laterality: N/A;   ESOPHAGOGASTRODUODENOSCOPY     EYE SURGERY     cataract surgery   IMPLANTATION OF HYPOGLOSSAL NERVE STIMULATOR Right 04/23/2022   Procedure: IMPLANTATION OF HYPOGLOSSAL NERVE STIMULATOR;  Surgeon: Osborn Coho, MD;  Location: [facility];  Service: ENT;  Laterality: Right;   KNEE ARTHROSCOPY  04/15/2012   Procedure: ARTHROSCOPY KNEE;  Surgeon: Loreta Ave, MD;  Location: [facility] OR;  Service: Orthopedics;  Laterality: Right;   LAPAROSCOPIC CHOLECYSTECTOMY     LASIK     OOPHORECTOMY     PALATE TO GINGIVA GRAFT  2017   REVERSE SHOULDER ARTHROPLASTY Left 02/13/2021   Procedure: REVERSE SHOULDER ARTHROPLASTY;  Surgeon: Teryl Lucy, MD;  Location: [facility];  Service: Orthopedics;  Laterality: Left;   RIGHT/LEFT HEART CATH AND CORONARY ANGIOGRAPHY N/A 08/02/2016   Procedure: Right/Left Heart Cath and Coronary Angiography;  Surgeon: Tonny Bollman, MD;  Location: [facility] INVASIVE CV LAB;  Service: Cardiovascular;  Laterality: N/A;   ROTATOR CUFF REPAIR Left    TEE WITHOUT CARDIOVERSION N/A 08/23/2016   Procedure: TRANSESOPHAGEAL ECHOCARDIOGRAM (TEE);  Surgeon: Purcell Nails, MD;  Location: [facility] OR;  Service: Open Heart Surgery;  Laterality: N/A;   TOTAL KNEE ARTHROPLASTY  04/15/2012   Procedure: TOTAL KNEE ARTHROPLASTY;  Surgeon: Loreta Ave, MD;  Location: [facility] OR;  Service: Orthopedics;  Laterality: Right;   TRIGGER  FINGER RELEASE Right 04/20/2015   Procedure: RIGHT TRIGGER FINGER RELEASE (TENDON SHEALTH INCISION) ;  Surgeon: Loreta Ave, MD;  Location: [facility];  Service: Orthopedics;  Laterality: Right;   Social History   Socioeconomic History   Marital status: Married    Spouse name: Freida Busman   Number of children: 1   Years of education: Not on file   Highest education level: Master's degree (e.g., MA, MS, MEng, MEd, MSW, MBA)  Occupational History   Occupation: Retired, Visual merchandiser: RETIRED  Tobacco Use   Smoking status: Never   Smokeless tobacco: Never  Vaping Use   Vaping Use: Never used  Substance and Sexual Activity   Alcohol use: No   Drug use: No   Sexual activity: Yes    Birth control/protection: Surgical    Comment: hyst  Other Topics Concern   Not on file  Social History Narrative   She lives with husband (1978) and two dogs. Step-son Camillia Herter (1971-psychiatrist in [place]). No grandkids. 2 dogs-sheltie/collie mix and border collie mix      Highest level of education:  Master in education   She is retired Clinical biochemist x 32 years.      Hobbies: time with dogs   Right handed   One story home         Social Determinants of Health   Financial Resource Strain: Low Risk  (10/02/2022)   Overall Financial Resource Strain (CARDIA)    Difficulty of Paying Living Expenses: Not hard at all  Food Insecurity: No Food Insecurity (10/02/2022)   Hunger Vital Sign    Worried About Running Out of Food in the Last Year: Never true    Ran Out of Food in the Last Year: Never true  Transportation Needs: No Transportation Needs (10/02/2022)   PRAPARE - Administrator, Civil Service (Medical): No    Lack of Transportation (Non-Medical): No  Physical Activity: Unknown (10/02/2022)   Exercise Vital Sign    Days of Exercise per Week: Patient declined    Minutes of Exercise per Session: Not on file  Stress: No Stress Concern Present  (10/02/2022)   [employer] of Occupational Health - Occupational Stress Questionnaire    Feeling of Stress : Not at all  Social Connections: Unknown (10/02/2022)   Social Connection and Isolation Panel [NHANES]    Frequency of Communication with Friends and Family: More than three times a week    Frequency of Social Gatherings with Friends and Family: Twice a week    Attends Religious Services: Patient declined    Database administrator or Organizations: No    Attends Engineer, structural: Not on file    Marital Status: Married   Family History  Problem Relation Age of Onset   COPD Mother    Colon  polyps Mother    Irritable bowel syndrome Mother    Anxiety disorder Mother    Heart disease Father    Alcohol abuse Father    Breast cancer Maternal Grandmother    Gallbladder disease Maternal Grandmother    Colon polyps Maternal Aunt    Diabetes Paternal Uncle        several uncles   Other Neg Hx        hyponatremia   Colon cancer Neg Hx    Esophageal cancer Neg Hx    Rectal cancer Neg Hx    Stomach cancer Neg Hx    Allergies  Allergen Reactions   Augmentin [Amoxicillin-Pot Clavulanate] Rash   Erythromycin Nausea Only   Penicillins Itching and Rash    Has patient had a PCN reaction causing immediate rash, facial/tongue/throat swelling, SOB or lightheadedness with hypotension: No Has patient had a PCN reaction causing severe rash involving mucus membranes or skin necrosis: No Has patient had a PCN reaction that required hospitalization: No Has patient had a PCN reaction occurring within the last 10 years: No  If all of the above answers are "NO", then may proceed with Cephalosporin use. Tolerated Ancef 02/13/21   Prior to Admission medications   Medication Sig Start Date End Date Taking? Authorizing Provider  amLODipine (NORVASC) 10 MG tablet Take 1 tablet (10 mg total) by mouth daily. 07/01/22  Yes Meriam Sprague, MD  atorvastatin (LIPITOR) 40 MG tablet TAKE  1 TABLET BY MOUTH DAILY AT 6 PM. 07/09/22  Yes Shelva Majestic, MD  Biotin 5 MG TBDP SMARTSIG:1 By Mouth 10/18/21  Yes [provider]  calcium-vitamin D (OSCAL WITH D) 500-200 MG-UNIT per tablet Take 2 tablets by mouth daily.    Yes [provider]  cloNIDine (CATAPRES) 0.1 MG tablet Take 1 tablet (0.1 mg total) by mouth at bedtime. 07/01/22  Yes Meriam Sprague, MD  clopidogrel (PLAVIX) 75 MG tablet TAKE 1 TABLET BY MOUTH EVERY DAY 07/22/22  Yes Shelva Majestic, MD  D-5000 125 MCG (5000 UT) TABS SMARTSIG:1 Tablet(s) By Mouth 10/18/21  Yes [provider]  demeclocycline (DECLOMYCIN) 300 MG tablet Take 1 tablet (300 mg total) by mouth 2 (two) times daily. 10/17/22  Yes Carlus Pavlov, MD  fluticasone (FLONASE) 50 MCG/ACT nasal spray Place 2 sprays into both nostrils daily.   Yes [provider]  ipratropium (ATROVENT) 0.03 % nasal spray 2 SPRAYS IN EACH NOSTRIL AS NEEDED THREE TIMES AS NEEDED 03/29/19  Yes [provider]  irbesartan (AVAPRO) 300 MG tablet Take 1 tablet (300 mg total) by mouth daily. 10/10/22  Yes Meriam Sprague, MD  loratadine (CLARITIN) 10 MG tablet Take 10 mg by mouth at bedtime.    Yes [provider]  Melatonin 5 MG TABS Take 5 mg by mouth at bedtime as needed (sleep).   Yes [provider]  metFORMIN (GLUCOPHAGE) 500 MG tablet TAKE 1 TABLET BY MOUTH WITH BREAKFAST AND 1/2 TO 1 TABLET WITH DINNER Patient taking differently: Take 500 mg by mouth daily. Taking 1 tablet one time daily. 05/09/22  Yes Shelva Majestic, MD  metoCLOPramide (REGLAN) 10 MG tablet TAKE 1/2 TABLET BY MOUTH 4 TIMES A DAY 10/17/22  Yes Shelva Majestic, MD  Multiple Vitamins-Minerals (MULTIVITAMIN ADULTS) TABS SMARTSIG:1 By Mouth 10/18/21  Yes [provider]  omeprazole (PRILOSEC) 40 MG capsule Take 1 capsule by mouth daily.   Yes [provider]  Probiotic Product (ALIGN) 4 MG CAPS Take 4  mg by mouth daily.    Yes  [provider]  psyllium (METAMUCIL SMOOTH TEXTURE) 28 % packet Take 1 packet by mouth 2 (two) times daily.   Yes [provider]  sodium chloride 1 g tablet Take 3 g by mouth 2 (two) times daily with a meal.   Yes [provider]  venlafaxine XR (EFFEXOR-XR) 75 MG 24 hr capsule TAKE 1 CAPSULE BY MOUTH DAILY WITH BREAKFAST. 08/19/22  Yes Shelva Majestic, MD  ACCU-CHEK GUIDE test strip USE TO TEST BLOOD SUGARS DAILY. DX: E11.9 11/20/21   Shelva Majestic, MD  Accu-Chek Softclix Lancets lancets USE AS INSTRUCTED 12/17/21   Shelva Majestic, MD  blood glucose meter kit and supplies KIT Dispense based on patient and insurance preference. Use up to four times daily as directed. Dx: E11.9 12/10/19   Shelva Majestic, MD  metroNIDAZOLE (METROCREAM) 0.75 % cream     [provider]  traMADol (ULTRAM) 50 MG tablet Take 50 mg by mouth every 6 (six) hours as needed.    [provider]     Positive ROS: All other systems have been reviewed and were otherwise negative with the exception of those mentioned in the HPI and as above.  Physical Exam: General: Alert, no acute distress Cardiovascular: No pedal edema Respiratory: No cyanosis, no use of accessory musculature GI: No organomegaly, abdomen is soft and non-tender Skin: No lesions in the area of chief complaint Neurologic: Sensation intact distally Psychiatric: Patient is competent for consent with normal mood and affect Lymphatic: No axillary or cervical lymphadenopathy  MUSCULOSKELETAL: On exam today, right shoulder active motion 0-150 degrees, but she does this with some weakness and some difficulty with cuff testing.  No pain over the [facility] joint.  Assessment: MRI demonstrates evidence for a full thickness cuff tear.  It looks like it is about 11 mm anterior-posterior and about 14 mm of retraction.    Plan: Plan for Procedure(s): SHOULDER ARTHROSCOPY WITH DEBRIDEMENT, ROTATOR CUFF REPAIR AND  SUBACROMIAL DECOMPRESSION  The risks benefits and alternatives were discussed with the patient including but not limited to the risks of nonoperative treatment, versus surgical intervention including infection, bleeding, nerve injury,  blood clots, cardiopulmonary complications, morbidity, mortality, among others, and they were willing to proceed.   Armida Sans, PA-C    12/23/2022 1:34 PM

## 2022-12-23 NOTE — Anesthesia Preprocedure Evaluation (Addendum)
Anesthesia Evaluation  Patient identified by MRN, date of birth, ID band Patient awake    Reviewed: Allergy & Precautions, NPO status , Patient's Chart, lab work & pertinent test results, reviewed documented beta blocker date and time   History of Anesthesia Complications (+) PONV and history of anesthetic complications  Airway Mallampati: II       Dental no notable dental hx. (+) Teeth Intact, Dental Advisory Given, Caps   Pulmonary asthma , sleep apnea    Pulmonary exam normal breath sounds clear to auscultation       Cardiovascular hypertension, Pt. on medications and Pt. on home beta blockers Normal cardiovascular exam+ Valvular Problems/Murmurs AI  Rhythm:Regular Rate:Normal  S/P AVR 08/23/2016  EKG 02/14/22 SB, minimal voltage criteria for LVH  Echo 08/27/22  1. Left ventricular ejection fraction, by estimation, is 60 to 65%. The  left ventricle has normal function. The left ventricle has no regional  wall motion abnormalities. Left ventricular diastolic parameters are  consistent with Grade I diastolic  dysfunction (impaired relaxation).   2. Right ventricular systolic function is normal. The right ventricular  size is normal.   3. Left atrial size was mildly dilated.   4. The mitral valve is normal in structure. Trivial mitral valve  regurgitation.   5. The aortic valve has been repaired/replaced. Aortic valve  regurgitation is mild to moderate. No aortic stenosis is present.  Procedure Date: 09/12/2016. Echo findings are consistent with perivalvular  leak of the aortic prosthesis. Aortic valve mean  gradient measures 15.0 mmHg. Aortic valve Vmax measures 2.36 m/s. Aortic  valve acceleration time measures 77 msec.      Neuro/Psych  Headaches PSYCHIATRIC DISORDERS Anxiety     TIA   GI/Hepatic Neg liver ROS,GERD  Medicated,,  Endo/Other  diabetes, Well Controlled, Type 2, Oral Hypoglycemic Agents  Obesity HLD   Renal/GU Hyponatremia  negative genitourinary   Musculoskeletal  (+) Arthritis , Osteoarthritis,  Impingement syndrome right shoulder Torn right rotator cuff   Abdominal  (+) + obese  Peds  Hematology  (+) Blood dyscrasia, anemia Plavix therapy- last dose 6/25   Anesthesia Other Findings   Reproductive/Obstetrics                              Anesthesia Physical Anesthesia Plan  ASA: 3  Anesthesia Plan: General   Post-op Pain Management: Regional block*, Minimal or no pain anticipated, Precedex and Tylenol PO (pre-op)*   Induction: Intravenous  PONV Risk Score and Plan: 4 or greater and Treatment may vary due to age or medical condition and Ondansetron  Airway Management Planned: Oral ETT  Additional Equipment: None  Intra-op Plan:   Post-operative Plan: Extubation in OR  Informed Consent: I have reviewed the patients History and Physical, chart, labs and discussed the procedure including the risks, benefits and alternatives for the proposed anesthesia with the patient or authorized representative who has indicated his/her understanding and acceptance.     Dental advisory given  Plan Discussed with: CRNA and Anesthesiologist  Anesthesia Plan Comments:          Anesthesia Quick Evaluation

## 2022-12-24 ENCOUNTER — Encounter (HOSPITAL_BASED_OUTPATIENT_CLINIC_OR_DEPARTMENT_OTHER): Payer: Self-pay | Admitting: Orthopedic Surgery

## 2022-12-24 ENCOUNTER — Ambulatory Visit (HOSPITAL_BASED_OUTPATIENT_CLINIC_OR_DEPARTMENT_OTHER): Payer: Medicare PPO | Admitting: Anesthesiology

## 2022-12-24 ENCOUNTER — Ambulatory Visit (HOSPITAL_BASED_OUTPATIENT_CLINIC_OR_DEPARTMENT_OTHER)
Admission: RE | Admit: 2022-12-24 | Discharge: 2022-12-24 | Disposition: A | Discharge status: Home / Self Care | Payer: Medicare PPO | Source: Ambulatory Visit | Attending: Orthopedic Surgery | Admitting: Orthopedic Surgery

## 2022-12-24 ENCOUNTER — Other Ambulatory Visit: Payer: Self-pay

## 2022-12-24 ENCOUNTER — Encounter (HOSPITAL_BASED_OUTPATIENT_CLINIC_OR_DEPARTMENT_OTHER)
Admission: RE | Disposition: A | Discharge status: Home / Self Care | Payer: Self-pay | Source: Ambulatory Visit | Attending: Orthopedic Surgery

## 2022-12-24 DIAGNOSIS — Z8673 Personal history of transient ischemic attack (TIA), and cerebral infarction without residual deficits: Secondary | ICD-10-CM | POA: Insufficient documentation

## 2022-12-24 DIAGNOSIS — E871 Hypo-osmolality and hyponatremia: Secondary | ICD-10-CM | POA: Insufficient documentation

## 2022-12-24 DIAGNOSIS — R519 Headache, unspecified: Secondary | ICD-10-CM | POA: Diagnosis not present

## 2022-12-24 DIAGNOSIS — E1169 Type 2 diabetes mellitus with other specified complication: Secondary | ICD-10-CM

## 2022-12-24 DIAGNOSIS — M75101 Unspecified rotator cuff tear or rupture of right shoulder, not specified as traumatic: Secondary | ICD-10-CM

## 2022-12-24 DIAGNOSIS — Z7902 Long term (current) use of antithrombotics/antiplatelets: Secondary | ICD-10-CM | POA: Diagnosis not present

## 2022-12-24 DIAGNOSIS — M7541 Impingement syndrome of right shoulder: Secondary | ICD-10-CM | POA: Insufficient documentation

## 2022-12-24 DIAGNOSIS — Z6832 Body mass index (BMI) 32.0-32.9, adult: Secondary | ICD-10-CM | POA: Insufficient documentation

## 2022-12-24 DIAGNOSIS — I351 Nonrheumatic aortic (valve) insufficiency: Secondary | ICD-10-CM | POA: Insufficient documentation

## 2022-12-24 DIAGNOSIS — E785 Hyperlipidemia, unspecified: Secondary | ICD-10-CM | POA: Diagnosis not present

## 2022-12-24 DIAGNOSIS — G8918 Other acute postprocedural pain: Secondary | ICD-10-CM | POA: Diagnosis not present

## 2022-12-24 DIAGNOSIS — E669 Obesity, unspecified: Secondary | ICD-10-CM | POA: Insufficient documentation

## 2022-12-24 DIAGNOSIS — M19011 Primary osteoarthritis, right shoulder: Secondary | ICD-10-CM | POA: Insufficient documentation

## 2022-12-24 DIAGNOSIS — S46011A Strain of muscle(s) and tendon(s) of the rotator cuff of right shoulder, initial encounter: Secondary | ICD-10-CM | POA: Diagnosis not present

## 2022-12-24 DIAGNOSIS — S46111A Strain of muscle, fascia and tendon of long head of biceps, right arm, initial encounter: Secondary | ICD-10-CM | POA: Insufficient documentation

## 2022-12-24 DIAGNOSIS — Z7984 Long term (current) use of oral hypoglycemic drugs: Secondary | ICD-10-CM | POA: Insufficient documentation

## 2022-12-24 DIAGNOSIS — D649 Anemia, unspecified: Secondary | ICD-10-CM | POA: Diagnosis not present

## 2022-12-24 DIAGNOSIS — G4730 Sleep apnea, unspecified: Secondary | ICD-10-CM | POA: Insufficient documentation

## 2022-12-24 DIAGNOSIS — E119 Type 2 diabetes mellitus without complications: Secondary | ICD-10-CM | POA: Insufficient documentation

## 2022-12-24 DIAGNOSIS — K219 Gastro-esophageal reflux disease without esophagitis: Secondary | ICD-10-CM | POA: Insufficient documentation

## 2022-12-24 DIAGNOSIS — Z01818 Encounter for other preprocedural examination: Secondary | ICD-10-CM

## 2022-12-24 DIAGNOSIS — M7551 Bursitis of right shoulder: Secondary | ICD-10-CM | POA: Diagnosis not present

## 2022-12-24 DIAGNOSIS — F419 Anxiety disorder, unspecified: Secondary | ICD-10-CM | POA: Insufficient documentation

## 2022-12-24 DIAGNOSIS — J45909 Unspecified asthma, uncomplicated: Secondary | ICD-10-CM | POA: Insufficient documentation

## 2022-12-24 DIAGNOSIS — X58XXXA Exposure to other specified factors, initial encounter: Secondary | ICD-10-CM | POA: Insufficient documentation

## 2022-12-24 DIAGNOSIS — M1990 Unspecified osteoarthritis, unspecified site: Secondary | ICD-10-CM | POA: Diagnosis not present

## 2022-12-24 DIAGNOSIS — I10 Essential (primary) hypertension: Secondary | ICD-10-CM | POA: Insufficient documentation

## 2022-12-24 DIAGNOSIS — M24111 Other articular cartilage disorders, right shoulder: Secondary | ICD-10-CM | POA: Diagnosis not present

## 2022-12-24 HISTORY — PX: SHOULDER ARTHROSCOPY WITH ROTATOR CUFF REPAIR AND SUBACROMIAL DECOMPRESSION: SHX5686

## 2022-12-24 LAB — GLUCOSE, CAPILLARY
Glucose-Capillary: 130 mg/dL — ABNORMAL HIGH (ref 70–99)
Glucose-Capillary: 141 mg/dL — ABNORMAL HIGH (ref 70–99)

## 2022-12-24 SURGERY — SHOULDER ARTHROSCOPY WITH ROTATOR CUFF REPAIR AND SUBACROMIAL DECOMPRESSION
Anesthesia: General | Laterality: Right

## 2022-12-24 MED ORDER — FENTANYL CITRATE (PF) 100 MCG/2ML IJ SOLN: 25.0000 ug | INTRAMUSCULAR | Status: DC | PRN

## 2022-12-24 MED ORDER — OXYCODONE HCL 5 MG/5ML PO SOLN: 5.0000 mg | Freq: Once | ORAL | Status: DC | PRN

## 2022-12-24 MED ORDER — OXYCODONE HCL 5 MG PO TABS: 5.0000 mg | ORAL_TABLET | Freq: Once | ORAL | Status: DC | PRN

## 2022-12-24 MED ORDER — ACETAMINOPHEN 500 MG PO TABS
1000.0000 mg | ORAL_TABLET | Freq: Once | ORAL | Status: AC
  Administered 2022-12-24: 1000 mg via ORAL

## 2022-12-24 MED ORDER — FENTANYL CITRATE (PF) 100 MCG/2ML IJ SOLN
50.0000 ug | Freq: Once | INTRAMUSCULAR | Status: AC
  Administered 2022-12-24: 50 ug via INTRAVENOUS

## 2022-12-24 MED ORDER — SENNA-DOCUSATE SODIUM 8.6-50 MG PO TABS: 2.0000 | ORAL_TABLET | Freq: Every day | ORAL | 1 refills | Status: AC

## 2022-12-24 MED ORDER — FENTANYL CITRATE (PF) 100 MCG/2ML IJ SOLN
INTRAMUSCULAR | Status: AC
  Filled 2022-12-24: qty 2

## 2022-12-24 MED ORDER — OXYCODONE HCL 5 MG PO TABS: 5.0000 mg | ORAL_TABLET | ORAL | 0 refills | Status: DC | PRN

## 2022-12-24 MED ORDER — SUGAMMADEX SODIUM 200 MG/2ML IV SOLN
INTRAVENOUS | Status: DC | PRN
  Administered 2022-12-24: 200 mg via INTRAVENOUS

## 2022-12-24 MED ORDER — PHENYLEPHRINE HCL-NACL 20-0.9 MG/250ML-% IV SOLN
INTRAVENOUS | Status: DC | PRN
  Administered 2022-12-24: 50 ug/min via INTRAVENOUS

## 2022-12-24 MED ORDER — BUPIVACAINE LIPOSOME 1.3 % IJ SUSP
INTRAMUSCULAR | Status: DC | PRN
  Administered 2022-12-24: 10 mL via PERINEURAL

## 2022-12-24 MED ORDER — PROPOFOL 10 MG/ML IV BOLUS
INTRAVENOUS | Status: DC | PRN
  Administered 2022-12-24: 100 mg via INTRAVENOUS

## 2022-12-24 MED ORDER — DEXAMETHASONE SODIUM PHOSPHATE 4 MG/ML IJ SOLN
INTRAMUSCULAR | Status: DC | PRN
  Administered 2022-12-24: 4 mg via INTRAVENOUS

## 2022-12-24 MED ORDER — CEFAZOLIN SODIUM-DEXTROSE 2-4 GM/100ML-% IV SOLN
2.0000 g | INTRAVENOUS | Status: AC
  Administered 2022-12-24: 2 g via INTRAVENOUS

## 2022-12-24 MED ORDER — BUPIVACAINE HCL (PF) 0.5 % IJ SOLN
INTRAMUSCULAR | Status: DC | PRN
  Administered 2022-12-24: 15 mL via PERINEURAL

## 2022-12-24 MED ORDER — CEFAZOLIN SODIUM-DEXTROSE 2-4 GM/100ML-% IV SOLN
INTRAVENOUS | Status: AC
  Filled 2022-12-24: qty 100

## 2022-12-24 MED ORDER — FENTANYL CITRATE (PF) 100 MCG/2ML IJ SOLN
INTRAMUSCULAR | Status: DC | PRN
  Administered 2022-12-24: 100 ug via INTRAVENOUS

## 2022-12-24 MED ORDER — ONDANSETRON HCL 4 MG/2ML IJ SOLN: 4.0000 mg | Freq: Once | INTRAMUSCULAR | Status: DC | PRN

## 2022-12-24 MED ORDER — SODIUM CHLORIDE 0.9 % IR SOLN
Status: DC | PRN
  Administered 2022-12-24: 15000 mL

## 2022-12-24 MED ORDER — ROCURONIUM BROMIDE 100 MG/10ML IV SOLN
INTRAVENOUS | Status: DC | PRN
  Administered 2022-12-24: 60 mg via INTRAVENOUS

## 2022-12-24 MED ORDER — LIDOCAINE HCL (CARDIAC) PF 100 MG/5ML IV SOSY
PREFILLED_SYRINGE | INTRAVENOUS | Status: DC | PRN
  Administered 2022-12-24: 60 mg via INTRAVENOUS

## 2022-12-24 MED ORDER — AMISULPRIDE (ANTIEMETIC) 5 MG/2ML IV SOLN: 10.0000 mg | Freq: Once | INTRAVENOUS | Status: DC | PRN

## 2022-12-24 MED ORDER — LACTATED RINGERS IV SOLN: INTRAVENOUS | Status: DC

## 2022-12-24 MED ORDER — ACETAMINOPHEN 500 MG PO TABS
ORAL_TABLET | ORAL | Status: AC
  Filled 2022-12-24: qty 2

## 2022-12-24 MED ORDER — ONDANSETRON HCL 4 MG/2ML IJ SOLN
INTRAMUSCULAR | Status: DC | PRN
  Administered 2022-12-24: 4 mg via INTRAVENOUS

## 2022-12-24 MED ORDER — ONDANSETRON HCL 4 MG PO TABS: 4.0000 mg | ORAL_TABLET | Freq: Three times a day (TID) | ORAL | 0 refills | Status: DC | PRN

## 2022-12-24 MED ORDER — TIZANIDINE HCL 4 MG PO TABS: 4.0000 mg | ORAL_TABLET | Freq: Three times a day (TID) | ORAL | 1 refills | Status: DC | PRN

## 2022-12-24 MED ORDER — PHENYLEPHRINE HCL (PRESSORS) 10 MG/ML IV SOLN
INTRAVENOUS | Status: AC
  Filled 2022-12-24: qty 1

## 2022-12-24 SURGICAL SUPPLY — 53 items
AID PSTN UNV HD RSTRNT DISP (MISCELLANEOUS) ×1
ANCH SUT SWLK 19.1X4.75 (Anchor) ×4 IMPLANT
ANCHOR SUT BIO SW 4.75X19.1 (Anchor) IMPLANT
BLADE EXCALIBUR 4.0X13 (MISCELLANEOUS) IMPLANT
BLADE SURG 15 STRL LF DISP TIS (BLADE) IMPLANT
BLADE SURG 15 STRL SS (BLADE)
BURR OVAL 8 FLU 4.0X13 (MISCELLANEOUS) ×1 IMPLANT
CANNULA 5.75X71 LONG (CANNULA) ×1 IMPLANT
CANNULA TWIST IN 8.25X7CM (CANNULA) IMPLANT
CLSR STERI-STRIP ANTIMIC 1/2X4 (GAUZE/BANDAGES/DRESSINGS) ×1 IMPLANT
DISSECTOR 3.8MM X 13CM (MISCELLANEOUS) ×1 IMPLANT
DRAPE IMP U-DRAPE 54X76 (DRAPES) ×1 IMPLANT
DRAPE INCISE IOBAN 66X45 STRL (DRAPES) ×1 IMPLANT
DRAPE POUCH INSTRU U-SHP 10X18 (DRAPES) ×1 IMPLANT
DRAPE SHOULDER BEACH CHAIR (DRAPES) ×1 IMPLANT
DRAPE SURG 17X23 STRL (DRAPES) ×1 IMPLANT
DRAPE U-SHAPE 47X51 STRL (DRAPES) ×1 IMPLANT
DURAPREP 26ML APPLICATOR (WOUND CARE) ×1 IMPLANT
FIBERSTICK 2 (SUTURE) IMPLANT
GAUZE PAD ABD 8X10 STRL (GAUZE/BANDAGES/DRESSINGS) ×1 IMPLANT
GAUZE SPONGE 4X4 12PLY STRL (GAUZE/BANDAGES/DRESSINGS) ×1 IMPLANT
GLOVE BIO SURGEON STRL SZ7 (GLOVE) ×1 IMPLANT
GLOVE BIOGEL PI IND STRL 7.0 (GLOVE) ×1 IMPLANT
GLOVE BIOGEL PI IND STRL 8 (GLOVE) ×2 IMPLANT
GLOVE ORTHO TXT STRL SZ7.5 (GLOVE) ×1 IMPLANT
GOWN STRL REUS W/ TWL LRG LVL3 (GOWN DISPOSABLE) ×1 IMPLANT
GOWN STRL REUS W/ TWL XL LVL3 (GOWN DISPOSABLE) ×2 IMPLANT
GOWN STRL REUS W/TWL LRG LVL3 (GOWN DISPOSABLE) ×1
GOWN STRL REUS W/TWL XL LVL3 (GOWN DISPOSABLE) ×2
LASSO 90 CVE QUICKPAS (DISPOSABLE) IMPLANT
MANIFOLD NEPTUNE II (INSTRUMENTS) ×1 IMPLANT
NDL HD SCORPION MEGA LOADER (NEEDLE) IMPLANT
PACK ARTHROSCOPY DSU (CUSTOM PROCEDURE TRAY) ×1 IMPLANT
PACK BASIN DAY SURGERY FS (CUSTOM PROCEDURE TRAY) ×1 IMPLANT
PORT APPOLLO RF 90DEGREE MULTI (SURGICAL WAND) ×1 IMPLANT
RESTRAINT HEAD UNIVERSAL NS (MISCELLANEOUS) ×1 IMPLANT
SHEET MEDIUM DRAPE 40X70 STRL (DRAPES) ×1 IMPLANT
SLEEVE SCD COMPRESS KNEE MED (STOCKING) ×1 IMPLANT
SLING ARM FOAM STRAP LRG (SOFTGOODS) IMPLANT
SPIKE FLUID TRANSFER (MISCELLANEOUS) IMPLANT
SUPPORT WRAP ARM LG (MISCELLANEOUS) ×1 IMPLANT
SUT FIBERWIRE #2 38 T-5 BLUE (SUTURE)
SUT MNCRL AB 4-0 PS2 18 (SUTURE) ×1 IMPLANT
SUT PDS AB 1 CT 36 (SUTURE) IMPLANT
SUT TIGER TAPE 7 IN WHITE (SUTURE) IMPLANT
SUT VIC AB 3-0 SH 27 (SUTURE)
SUT VIC AB 3-0 SH 27X BRD (SUTURE) IMPLANT
SUTURE FIBERWR #2 38 T-5 BLUE (SUTURE) IMPLANT
TAPE FIBER 2MM 7IN #2 BLUE (SUTURE) IMPLANT
TOWEL GREEN STERILE FF (TOWEL DISPOSABLE) ×1 IMPLANT
TUBE CONNECTING 20X1/4 (TUBING) IMPLANT
TUBING ARTHROSCOPY IRRIG 16FT (MISCELLANEOUS) ×1 IMPLANT
WATER STERILE IRR 1000ML POUR (IV SOLUTION) ×1 IMPLANT

## 2022-12-24 NOTE — Interval H&P Note (Signed)
History and Physical Interval Note:  12/24/2022 9:43 AM  [name]  has presented today for surgery, with the diagnosis of RIGHT SHOULDER CARTLIGE DISORDER, IMPINGEMENT SYNDROME, ROTATOR CUFF TEAR..  The various methods of treatment have been discussed with the patient and family. After consideration of risks, benefits and other options for treatment, the patient has consented to  Procedure(s): SHOULDER ARTHROSCOPY WITH DEBRIDEMENT, ROTATOR CUFF REPAIR AND SUBACROMIAL DECOMPRESSION (Right) as a surgical intervention.  The patient's history has been reviewed, patient examined, no change in status, stable for surgery.  I have reviewed the patient's chart and labs.  Questions were answered to the patient's satisfaction.     Eulas Post

## 2022-12-24 NOTE — Anesthesia Postprocedure Evaluation (Signed)
Anesthesia Post Note  Patient: [name]  Procedure(s) Performed: SHOULDER ARTHROSCOPY WITH DEBRIDEMENT, ROTATOR CUFF REPAIR AND SUBACROMIAL DECOMPRESSION (Right)     Patient location during evaluation: PACU Anesthesia Type: General Level of consciousness: awake and alert Pain management: pain level controlled Vital Signs Assessment: post-procedure vital signs reviewed and stable Respiratory status: spontaneous breathing, nonlabored ventilation and respiratory function stable Cardiovascular status: blood pressure returned to baseline and stable Postop Assessment: no apparent nausea or vomiting Anesthetic complications: no   No notable events documented.  Last Vitals:  Vitals:   12/24/22 1300 12/24/22 1315  BP: (!) 146/59 (!) 162/61  Pulse: 80 82  Resp: 16 11  Temp:    SpO2: 96% 97%    Last Pain:  Vitals:   12/24/22 1315  TempSrc:   PainSc: 0-No pain                 Yehudah Standing A.

## 2022-12-24 NOTE — Discharge Instructions (Addendum)
Shoulder Arthroscopy Rotator Cuff Repair Post-Operative Instructions  Diet: Start with some clear liquids, soups, etc, and advance to your regular diet as tolerated. **Please make sure you restart your plavix on Wednesday (post-op Day 1)**  Dressing:  You may remove your dressing 3-5 days after surgery and shower.  There are steri-strips (white strips) over the incisions.  Your stitches are absorbable.  Leave the steri-strips in place when changing your dressings, they will peel off with time, usually 2-3 weeks.  Keep your wounds covered with band-aids/gauze until your first post-op appointment.  Activity:  Keep the sling on at all times except for hygiene.  It is OK to begin with exercises moving your elbow up and down, and working your hand, wrist, and fingers to keep them limber, but DO NOT try lifting your arm or rotating it out, as this will put dangerous stress on the repair.  You cannot drive while taking narcotic medications or while you are in the sling.  We generally do not begin therapy until after we get through the first phases of tendon healing, at six weeks after the surgery.  This is in order to reduce the risk of disrupting the repair.    Weight Bearing:   Do not lift your arm.  You can get dressed by leaning forward and letting the arm dangle and sliding your shirt sleeve over the operative arm first, without having to lift the arm.    Medications:  You will want to take some of your pain medications tonight before going to bed to make sure you have something in your system when the numbing medicine/block wears off.  The maximum dose of Tylenol/Acetaminophen in a day is 3,000-4,000 mg, and beware that your pain medication may have Tylenol (acetaminophen) in it.  As your pain improves, you can begin to taper the amount of narcotic you are using. You may want to avoid using ibuprofen/motrin/NSAIDs for the first 4-6 weeks, as they can slow down tendon and bone healing.    To prevent  constipation: you may use a stool softener such as -  Colace (over the counter) 100 mg by mouth twice a day  Drink plenty of fluids (prune juice may be helpful) and high fiber foods Miralax (over the counter) for constipation as needed.    Itching:  If you experience itching or other side effects with your pain medications, try taking only a single pain pill, or even half a pain pill at a time.  You can also use Benadryl for itching or also to help with sleep.   Precautions:  If you experience chest pain or shortness of breath - call 911 immediately for transfer to the hospital emergency department!!  If you develop a fever greater that 101 F, purulent drainage from wound, increased redness or drainage from wound, or calf pain -- Call the office at [phone]                                                 Follow- Up Appointment:  Please call for an appointment to be seen in 2 weeks [phone] in [place].  After-Hours:  We have an Urgent Care available for after-hours emergencies located at the [facility] office at [facility] in [place] open from 5:30p-9p every night, and from 10a-2p on Saturday and Sunday.  There is also  an on-call physician 24-7 for emergencies that can be reached at [phone].  You may have Tylenol again after 2:30pm today.   Post Anesthesia Home Care Instructions  Activity: Get plenty of rest for the remainder of the day. A responsible individual must stay with you for 24 hours following the procedure.  For the next 24 hours, DO NOT: -Drive a car -Advertising copywriter -Drink alcoholic beverages -Take any medication unless instructed by your physician -Make any legal decisions or sign important papers.  Meals: Start with liquid foods such as gelatin or soup. Progress to regular foods as tolerated. Avoid greasy, spicy, heavy foods. If nausea and/or vomiting occur, drink only clear liquids until the nausea and/or vomiting subsides. Call your  physician if vomiting continues.  Special Instructions/Symptoms: Your throat may feel dry or sore from the anesthesia or the breathing tube placed in your throat during surgery. If this causes discomfort, gargle with warm salt water. The discomfort should disappear within 24 hours.  If you had a scopolamine patch placed behind your ear for the management of post- operative nausea and/or vomiting:  1. The medication in the patch is effective for 72 hours, after which it should be removed.  Wrap patch in a tissue and discard in the trash. Wash hands thoroughly with soap and water. 2. You may remove the patch earlier than 72 hours if you experience unpleasant side effects which may include dry mouth, dizziness or visual disturbances. 3. Avoid touching the patch. Wash your hands with soap and water after contact with the patch.   Regional Anesthesia Blocks  1. Numbness or the inability to move the "blocked" extremity may last from 3-48 hours after placement. The length of time depends on the medication injected and your individual response to the medication. If the numbness is not going away after 48 hours, call your surgeon.  2. The extremity that is blocked will need to be protected until the numbness is gone and the  Strength has returned. Because you cannot feel it, you will need to take extra care to avoid injury. Because it may be weak, you may have difficulty moving it or using it. You may not know what position it is in without looking at it while the block is in effect.  3. For blocks in the legs and feet, returning to weight bearing and walking needs to be done carefully. You will need to wait until the numbness is entirely gone and the strength has returned. You should be able to move your leg and foot normally before you try and bear weight or walk. You will need someone to be with you when you first try to ensure you do not fall and possibly risk injury.  4. Bruising and tenderness at the  needle site are common side effects and will resolve in a few days.  5. Persistent numbness or new problems with movement should be communicated to the surgeon or the [facility] [phone] [facility] [phone]).

## 2022-12-24 NOTE — Transfer of Care (Signed)
Immediate Anesthesia Transfer of Care Note  Patient: [name]  Procedure(s) Performed: SHOULDER ARTHROSCOPY WITH DEBRIDEMENT, ROTATOR CUFF REPAIR AND SUBACROMIAL DECOMPRESSION (Right)  Patient Location: PACU  Anesthesia Type:GA combined with regional for post-op pain  Level of Consciousness: awake, alert , and oriented  Airway & Oxygen Therapy: Patient Spontanous Breathing and Patient connected to face mask oxygen  Post-op Assessment: Report given to RN and Post -op Vital signs reviewed and stable  Post vital signs: Reviewed and stable  Last Vitals:  Vitals Value Taken Time  BP 146/79 12/24/22 1230  Temp 36.3 C 12/24/22 1230  Pulse 78 12/24/22 1239  Resp 17 12/24/22 1239  SpO2 96 % 12/24/22 1239  Vitals shown include unvalidated device data.  Last Pain:  Vitals:   12/24/22 0816  TempSrc: Temporal  PainSc: 0-No pain      Patients Stated Pain Goal: 4 (12/24/22 0816)  Complications: No notable events documented.

## 2022-12-24 NOTE — Anesthesia Procedure Notes (Addendum)
Anesthesia Regional Block: Interscalene brachial plexus block   Pre-Anesthetic Checklist: , timeout performed,  Correct Patient, Correct Site, Correct Laterality,  Correct Procedure, Correct Position, site marked,  Risks and benefits discussed,  Surgical consent,  Pre-op evaluation,  At surgeon's request and post-op pain management  Laterality: Right  Prep: chloraprep       Needles:  Injection technique: Single-shot  Needle Type: Echogenic Stimulator Needle     Needle Length: 10cm  Needle Gauge: 21   Needle insertion depth: 6 cm   Additional Needles:   Procedures:,,,, ultrasound used (permanent image in chart),,   Motor weakness within 5 minutes.  Narrative:  Start time: 12/24/2022 8:45 AM End time: 12/24/2022 8:50 AM Injection made incrementally with aspirations every 5 mL.  Performed by: Personally  Anesthesiologist: Mal Amabile, MD  Additional Notes: Timeout performed. Patient sedated. Relevant anatomy ID'd using [name]. Incremental 2-53ml injection of LA with frequent aspiration. Patient tolerated procedure well.     Right Interscalene Block

## 2022-12-24 NOTE — Anesthesia Procedure Notes (Signed)
Procedure Name: Intubation Date/Time: 12/24/2022 10:33 AM  Performed by: Burna Cash, CRNAPre-anesthesia Checklist: Patient identified, Emergency Drugs available, Suction available and Patient being monitored Patient Re-evaluated:Patient Re-evaluated prior to induction Oxygen Delivery Method: Circle system utilized Preoxygenation: Pre-oxygenation with 100% oxygen Induction Type: IV induction Ventilation: Mask ventilation without difficulty Laryngoscope Size: Mac and 3 Grade View: Grade I Tube type: Oral Tube size: 7.0 mm Number of attempts: 1 Airway Equipment and Method: Stylet and Oral airway Placement Confirmation: ETT inserted through vocal cords under direct vision, positive ETCO2 and breath sounds checked- equal and bilateral Secured at: 19 cm Tube secured with: Tape Dental Injury: Teeth and Oropharynx as per pre-operative assessment

## 2022-12-24 NOTE — Op Note (Signed)
12/24/2022  12:21 PM  PATIENT:  [name]    PRE-OPERATIVE DIAGNOSIS: Right shoulder supraspinatus tear, impingement syndrome, AC joint arthrosis, biceps tendon rupture  POST-OPERATIVE DIAGNOSIS:  Same  PROCEDURE: Right shoulder arthroscopy with extensive debridement of biceps tendon, superior labrum, subdeltoid bursa, with acromioplasty, distal clavicle resection, and supraspinatus repair  SURGEON:  Eulas Post, MD  PHYSICIAN ASSISTANT: Janine Ores, PA-C, present and scrubbed throughout the case, critical for completion in a timely fashion, and for retraction, instrumentation, and closure.  ANESTHESIA:   General  The risks benefits and alternatives were discussed with the patient preoperatively including but not limited to the risks of infection, bleeding, nerve injury, cardiopulmonary complications, the need for revision surgery, among others, and the patient was willing to proceed.  ESTIMATED BLOOD LOSS: Minimal  OPERATIVE IMPLANTS: Arthrex bio composite swivel lock x 3, with a total of 2 fiber tape for the reamings passed through the medial anchor, and I used a #2 FiberWire from the anterior lateral and lateral augmentation of the small dogear anteriorly.  I used an anchor/  OPERATIVE FINDINGS: Initially had full motion and tissue quality was sent.  Chondral surfaces were all intact.  She had a full-thickness 1.5 x 2 cm supraspinatus tear with delamination.  There is significant subacromial spurring as well as CA ligament fraying and extensive degenerative changes of the acromioclavicular joint.  OPERATIVE FINDING: Exam under anesthesia: Normal Articular space: Normal Chondral surfaces:  There is little bit of chondral irregularities, medial full-thickness chondral loss, the humeral head Had a couple of areas of chondral inconsistency. Biceps:  There is significant tearing of the biceps tendon, and there was a remaining stump that was shredded Subscapularis: The  subscapularis had segmental border fraying, but is structurally still connected to the humeral head, but mood is not unique, and pulling along with the graft that did not destabilize the tendon connection. Supraspinatus: Complete tear This fairly poor quality tissue with delamination tearing a 1.5 x 2 cm tear, there was not quite enough exposed footprint medially to allow for 2 anchors, but certainly a moderate-sized tear Infraspinatus: Intact   Bone quality was overall poor.   PREOPERATIVE INDICATIONS:  [name] is a  72 y.o. female with significant shoulder pain who failed conservative measures and elected for surgical management.    The risks benefits and alternatives were discussed with the patient preoperatively including but not limited to the risks of infection, bleeding, nerve injury, cardiopulmonary complications, the need for revision surgery, among others, and the patient was willing to proceed.  ESTIMATED BLOOD LOSS: Minimal  OPERATIVE PROCEDURE: The patient was brought to the operating room and placed in the supine position.  General anesthesia was administered and the patient positioned in a beach chair position.  IV antibiotics were given.  The upper extremity was prepped and draped in the usual sterile fashion.  Timeout performed.  Diagnostic arthroscopy was carried out with the above named findings.    The glenoid labrum was debrided anteriorly and superiorly.    The stump of the biceps tendon was excised that was remaining.  I debrided the undersurface of the cuff as well as prepared the medial edge of the tuberosity for reimplantation using the shaver.  I went to the subacromial space, performed a complete bursectomy, subacromial CA ligament release, with a acromioplasty.  I evaluated the tear from viewing laterally, used the shaver from posterior laterally, debrided the tear as well as the bony footprint, and prepared the tendon for  reinsertion.  I placed 1  anchor double loaded from above using an anterior and posterior portal with percutaneous placement for the medial row preloaded with fiber tape in each anchor.  I passed the sutures using a scorpion suture passer from front to back, and then brought these into lateral anchors.  Excellent fixation and reduction of the tendon was achieved.  I touched up the acromioplasty viewing from lateral portal.  I used the anterior rescue stitch to augment the repair bringing down a small anterior dogear.  I then resected 1 cm of the distal clavicle and confirmed from anterior portal.  The instruments were removed, the portals closed with Monocryl followed by Steri-Strips and sterile gauze.  The patient was awakened and returned to the PACU in stable and satisfactory condition.  There were no complications and She tolerated the procedure well.

## 2022-12-24 NOTE — Progress Notes (Signed)
Assisted Dr. Foster with right, interscalene , ultrasound guided block. Side rails up, monitors on throughout procedure. See vital signs in flow sheet. Tolerated Procedure well. 

## 2022-12-25 ENCOUNTER — Encounter (HOSPITAL_BASED_OUTPATIENT_CLINIC_OR_DEPARTMENT_OTHER): Payer: Self-pay | Admitting: Orthopedic Surgery

## 2023-01-06 ENCOUNTER — Other Ambulatory Visit: Payer: Self-pay

## 2023-01-06 DIAGNOSIS — M19011 Primary osteoarthritis, right shoulder: Secondary | ICD-10-CM | POA: Diagnosis not present

## 2023-01-06 MED ORDER — ACCU-CHEK GUIDE VI STRP: ORAL_STRIP | 12 refills | Status: AC

## 2023-02-06 NOTE — Therapy (Unsigned)
OUTPATIENT PHYSICAL THERAPY SHOULDER EVALUATION   Patient Name: [name] MRN: [id number] DOB:10-11-1950, 72 y.o., female Today's Date: 02/10/2023  END OF SESSION:  PT End of Session - 02/10/23 1052     Visit Number 1    Date for PT Re-Evaluation 05/05/23    PT Start Time 1015    PT Stop Time 1050    PT Time Calculation (min) 35 min    Activity Tolerance Patient limited by pain    Behavior During Therapy WFL for tasks assessed/performed             Past Medical History:  Diagnosis Date   Allergy    Anxiety    Arthritis    back & knee   Asthma     mild per pt shows up with resp illness   Colon polyps    Constipation    Diabetes mellitus without complication (HCC)    Gallstones    Gastric polyps    Gastroparesis    GERD (gastroesophageal reflux disease)    Headache    sinus headaches and migraines at times   Heart murmur    History of migraine headaches    HTN (hypertension)    Hyperlipidemia    IBS (irritable bowel syndrome)    Joint pain    Lumbar disc disease    PONV (postoperative nausea and vomiting)    S/P aortic valve replacement with bioprosthetic valve 08/23/2016   25 mm Edwards Intuity rapid-deployment bovine pericardial tissue valve via partial upper mini sternotomy   Sleep apnea    does not use CPAP   TIA (transient ischemic attack)    TIA (transient ischemic attack)    hx of per pt   Past Surgical History:  Procedure Laterality Date   ABDOMINAL HYSTERECTOMY     AORTIC VALVE REPLACEMENT N/A 08/23/2016   Procedure: AORTIC VALVE REPLACEMENT (AVR) - using partial Upper Sternotomy- 25mm Edwards Intuity Aortic Valve used;  Surgeon: Purcell Nails, MD;  Location: [facility] OR;  Service: Open Heart Surgery;  Laterality: N/A;   BLADDER SUSPENSION  2007   BREAST ENHANCEMENT SURGERY  1980   CARPAL TUNNEL RELEASE Bilateral    COLONOSCOPY     DORSAL COMPARTMENT RELEASE Right 09/15/2014   Procedure: RIGHT WRIST DEQUERVAINS RELEASE ;  Surgeon: Mckinley Jewel, MD;  Location: [facility];  Service: Orthopedics;  Laterality: Right;   DRUG INDUCED ENDOSCOPY N/A 02/19/2022   Procedure: DRUG INDUCED SLEEP ENDOSCOPY;  Surgeon: Osborn Coho, MD;  Location: [facility];  Service: ENT;  Laterality: N/A;   ESOPHAGOGASTRODUODENOSCOPY     EYE SURGERY     cataract surgery   IMPLANTATION OF HYPOGLOSSAL NERVE STIMULATOR Right 04/23/2022   Procedure: IMPLANTATION OF HYPOGLOSSAL NERVE STIMULATOR;  Surgeon: Osborn Coho, MD;  Location: [facility];  Service: ENT;  Laterality: Right;   KNEE ARTHROSCOPY  04/15/2012   Procedure: ARTHROSCOPY KNEE;  Surgeon: Loreta Ave, MD;  Location: [facility] OR;  Service: Orthopedics;  Laterality: Right;   LAPAROSCOPIC CHOLECYSTECTOMY     LASIK     OOPHORECTOMY     PALATE TO GINGIVA GRAFT  2017   REVERSE SHOULDER ARTHROPLASTY Left 02/13/2021   Procedure: REVERSE SHOULDER ARTHROPLASTY;  Surgeon: Teryl Lucy, MD;  Location: [facility];  Service: Orthopedics;  Laterality: Left;   RIGHT/LEFT HEART CATH AND CORONARY ANGIOGRAPHY N/A 08/02/2016   Procedure: Right/Left Heart Cath and Coronary Angiography;  Surgeon: Tonny Bollman, MD;  Location: [facility] INVASIVE CV LAB;  Service:  Cardiovascular;  Laterality: N/A;   ROTATOR CUFF REPAIR Left    SHOULDER ARTHROSCOPY WITH ROTATOR CUFF REPAIR AND SUBACROMIAL DECOMPRESSION Right 12/24/2022   Procedure: SHOULDER ARTHROSCOPY WITH DEBRIDEMENT, ROTATOR CUFF REPAIR AND SUBACROMIAL DECOMPRESSION;  Surgeon: Teryl Lucy, MD;  Location: [facility];  Service: Orthopedics;  Laterality: Right;   TEE WITHOUT CARDIOVERSION N/A 08/23/2016   Procedure: TRANSESOPHAGEAL ECHOCARDIOGRAM (TEE);  Surgeon: Purcell Nails, MD;  Location: [facility] OR;  Service: Open Heart Surgery;  Laterality: N/A;   TOTAL KNEE ARTHROPLASTY  04/15/2012   Procedure: TOTAL KNEE ARTHROPLASTY;  Surgeon: Loreta Ave, MD;  Location: [facility] OR;  Service: Orthopedics;  Laterality: Right;    TRIGGER FINGER RELEASE Right 04/20/2015   Procedure: RIGHT TRIGGER FINGER RELEASE (TENDON SHEALTH INCISION) ;  Surgeon: Loreta Ave, MD;  Location: [facility];  Service: Orthopedics;  Laterality: Right;   Patient Active Problem List   Diagnosis Date Noted   Class 2 severe obesity with serious comorbidity and body mass index (BMI) of 36.0 to 36.9 in adult (HCC) 05/09/2022   Type 2 diabetes mellitus with obesity (HCC) 05/09/2022   Hypertension associated with type 2 diabetes mellitus (HCC) 05/09/2022   SOB (shortness of breath) 12/10/2021   S/P reverse total shoulder arthroplasty, left 02/13/2021   Idiopathic hyperphosphatasia 08/03/2019   Vitamin D deficiency 06/04/2018   Hyponatremia 02/17/2018   History of CVA in adulthood 08/13/2017   Blurry vision 08/13/2017   Primary hypertension 01/02/2017   S/P minimally invasive aortic valve replacement with bioprosthetic valve 08/23/2016   Diastolic dysfunction    Osteopenia 05/27/2016   OSA (obstructive sleep apnea) 02/27/2016   Bruxism 10/23/2015   Elevated alkaline phosphatase level 05/16/2015   Atypical chest pain 11/10/2014   Acne 05/12/2014   Diabetes mellitus (HCC) 05/12/2014   GERD (gastroesophageal reflux disease) 03/10/2014   Generalized anxiety disorder 03/10/2014   Migraines 03/10/2014   EUSTACHIAN TUBE DYSFUNCTION, CHRONIC 06/14/2010   Irritable bowel syndrome 12/15/2008   Low back pain 08/11/2008   Hyperlipidemia associated with type 2 diabetes mellitus (HCC) 02/09/2008   Allergic rhinitis 02/12/2007   Asthma 02/12/2007    PCP: Shelva Majestic, MD  REFERRING PROVIDER: Teryl Lucy, MD  REFERRING DIAG: Rt Shoulder Arthroscopy 12/24/22   THERAPY DIAG:  Stiffness of right shoulder, not elsewhere classified  Muscle weakness (generalized)  Localized edema  Acute postoperative pain of right shoulder  History of arthroscopy of right shoulder  Rationale for Evaluation and Treatment:  Rehabilitation  ONSET DATE: 12/24/22  SUBJECTIVE:  SUBJECTIVE STATEMENT: Patient had arthroscopic shoulder repair on 12/24/22. Saw the Dr last week. She can remove sling when at home, but still wear it when out. Hand dominance: Right  PERTINENT HISTORY: Per order: S/P R shoulder arthroscopy with RC repair, distal clavicle excision and acromioplasty 12/24/22 Supraspinatus complete tear, biceps tendon with significant tearing Surgical: PROCEDURE: Right shoulder arthroscopy with extensive debridement of biceps tendon, superior labrum, subdeltoid bursa, with acromioplasty, distal clavicle resection, and supraspinatus repair    PAIN:  Are you having pain? Yes: NPRS scale: 4/10 Pain location: R shoulder Pain description: aching Aggravating factors: Has trouble getting comfortable at night. Relieving factors: Tramadol at  night, Tylenol in am  PRECAUTIONS: Shoulder  RED FLAGS: None   WEIGHT BEARING RESTRICTIONS: Patient reports none given  FALLS:  Has patient fallen in last 6 months? No  LIVING ENVIRONMENT: Lives with: lives with their family and lives with their spouse Lives in: House/apartment Stairs: Yes: External: 5 steps; on left going up Has following equipment at home: None  OCCUPATION: Retired  PLOF: Independent  PATIENT GOALS:Patient would like to regain her motion and strength in the R arm, be able to sleep  NEXT MD VISIT:   OBJECTIVE:   DIAGNOSTIC FINDINGS:  N/A  COGNITION: Overall cognitive status: Within functional limits for tasks assessed     SENSATION: WFL  POSTURE: Round shoulders, flexed posture, dowager's hump  UPPER EXTREMITY ROM:   P/A ROM Right eval Left eval  Shoulder flexion 50/105   Shoulder extension    Shoulder abduction 46/58   Shoulder adduction     Shoulder internal rotation WNL   Shoulder external rotation 30/45   Elbow flexion    Elbow extension    Wrist flexion    Wrist extension    Wrist ulnar deviation    Wrist radial deviation    Wrist pronation    Wrist supination    (Blank rows = not tested)  UPPER EXTREMITY MMT: 3-/5 R shoulder. Elbow, wrist at least 3+/5   JOINT MOBILITY TESTING:  G-H deferred, scapula ROM limited, stiff  PALPATION:  TTP over supraspinatus, pects with tihgtness in pects and cervical paraspinals.   TODAY'S TREATMENT:                                                                                                                                         DATE:  02/10/23 Education Table slides into flex and abd, scap retraction, elbow, wrist, forearm exercises-active  PATIENT EDUCATION: Education details: POC Person educated: Patient Education method: Explanation Education comprehension: verbalized understanding  HOME EXERCISE PROGRAM: [id number]  ASSESSMENT:  CLINICAL IMPRESSION: Patient is a 72 y.o. who was seen today for physical therapy evaluation and treatment S/P Right shoulder arthroscopy with extensive debridement of biceps tendon, superior labrum, subdeltoid bursa, with acromioplasty, distal clavicle resection, and supraspinatus repair on 12/24/22. She presents with pain, weakness, decreased A and PROM, decreased  functional use of the RUE. She will benefit from PT to facilitate improvement in all areas of R shoulder mobility and use to maximize recovery and return to her normal daily activities.  OBJECTIVE IMPAIRMENTS: decreased activity tolerance, decreased balance, decreased coordination, decreased ROM, decreased strength, impaired flexibility, impaired UE functional use, improper body mechanics, postural dysfunction, and pain.   ACTIVITY LIMITATIONS: carrying, lifting, sleeping, and reach over head  PARTICIPATION LIMITATIONS: meal prep, cleaning, laundry, driving, shopping, and  community activity  PERSONAL FACTORS: Past/current experiences are also affecting patient's functional outcome.   REHAB POTENTIAL: Good  CLINICAL DECISION MAKING: Evolving/moderate complexity  EVALUATION COMPLEXITY: Moderate   GOALS: Goals reviewed with patient? Yes  SHORT TERM GOALS: Target date: 02/28/23  I with initial HEP Baseline: Goal status: INITIAL  LONG TERM GOALS: Target date: 05/05/23  I with final HEP Baseline:  Goal status: INITIAL  2.  Patient will recover at least 120 degrees of active R shoulder flexion and abduction Baseline: See ROM Goal status: INITIAL  3.  Increase RUE shoulder strength to at least 4/5 Baseline: 3-/5 Goal status: INITIAL  4.  Patient will report the ability to perform all of her normal daily activities with RU. Baseline: Severely limited functionally Goal status: INITIAL  5.  Decrease R shoulder pain to < 2/10 with all activity. Baseline: 4/10 Goal status: INITIAL  PLAN:  PT FREQUENCY: 1-2x/week  PT DURATION: 12 weeks  PLANNED INTERVENTIONS: Therapeutic exercises, Therapeutic activity, Neuromuscular re-education, Balance training, Gait training, Patient/Family education, Self Care, Joint mobilization, Dry Needling, Electrical stimulation, Cryotherapy, Moist heat, Vasopneumatic device, Ionotophoresis 4mg /ml Dexamethasone, and Manual therapy  PLAN FOR NEXT SESSION: AAROM, scapular stabilizing exercises.   Iona Beard, DPT 02/10/2023, 2:41 PM

## 2023-02-10 ENCOUNTER — Encounter: Payer: Self-pay | Admitting: Physical Therapy

## 2023-02-10 ENCOUNTER — Ambulatory Visit: Payer: Medicare PPO | Attending: Orthopedic Surgery | Admitting: Physical Therapy

## 2023-02-10 DIAGNOSIS — M25811 Other specified joint disorders, right shoulder: Secondary | ICD-10-CM | POA: Insufficient documentation

## 2023-02-10 DIAGNOSIS — M25611 Stiffness of right shoulder, not elsewhere classified: Secondary | ICD-10-CM

## 2023-02-10 DIAGNOSIS — M6281 Muscle weakness (generalized): Secondary | ICD-10-CM | POA: Diagnosis not present

## 2023-02-10 DIAGNOSIS — R600 Localized edema: Secondary | ICD-10-CM | POA: Diagnosis not present

## 2023-02-10 DIAGNOSIS — G8918 Other acute postprocedural pain: Secondary | ICD-10-CM

## 2023-02-10 DIAGNOSIS — Z9889 Other specified postprocedural states: Secondary | ICD-10-CM | POA: Diagnosis not present

## 2023-02-10 DIAGNOSIS — M25511 Pain in right shoulder: Secondary | ICD-10-CM | POA: Insufficient documentation

## 2023-02-13 ENCOUNTER — Ambulatory Visit: Payer: Medicare PPO | Admitting: Physical Therapy

## 2023-02-13 DIAGNOSIS — M25811 Other specified joint disorders, right shoulder: Secondary | ICD-10-CM | POA: Diagnosis not present

## 2023-02-13 DIAGNOSIS — M25511 Pain in right shoulder: Secondary | ICD-10-CM | POA: Diagnosis not present

## 2023-02-13 DIAGNOSIS — M25611 Stiffness of right shoulder, not elsewhere classified: Secondary | ICD-10-CM | POA: Diagnosis not present

## 2023-02-13 DIAGNOSIS — G8918 Other acute postprocedural pain: Secondary | ICD-10-CM

## 2023-02-13 DIAGNOSIS — M6281 Muscle weakness (generalized): Secondary | ICD-10-CM

## 2023-02-13 DIAGNOSIS — R600 Localized edema: Secondary | ICD-10-CM

## 2023-02-13 DIAGNOSIS — Z9889 Other specified postprocedural states: Secondary | ICD-10-CM | POA: Diagnosis not present

## 2023-02-13 NOTE — Therapy (Signed)
OUTPATIENT PHYSICAL THERAPY SHOULDER EVALUATION   Patient Name: RAFEEF [name] MRN: [id number] DOB:01/13/1951, 72 y.o., female Today's Date: 02/13/2023  END OF SESSION:  PT End of Session - 02/13/23 1511     Visit Number 2    Date for PT Re-Evaluation 05/05/23    PT Start Time 1520    PT Stop Time 1605    PT Time Calculation (min) 45 min             Past Medical History:  Diagnosis Date   Allergy    Anxiety    Arthritis    back & knee   Asthma     mild per pt shows up with resp illness   Colon polyps    Constipation    Diabetes mellitus without complication (HCC)    Gallstones    Gastric polyps    Gastroparesis    GERD (gastroesophageal reflux disease)    Headache    sinus headaches and migraines at times   Heart murmur    History of migraine headaches    HTN (hypertension)    Hyperlipidemia    IBS (irritable bowel syndrome)    Joint pain    Lumbar disc disease    PONV (postoperative nausea and vomiting)    S/P aortic valve replacement with bioprosthetic valve 08/23/2016   25 mm Edwards Intuity rapid-deployment bovine pericardial tissue valve via partial upper mini sternotomy   Sleep apnea    does not use CPAP   TIA (transient ischemic attack)    TIA (transient ischemic attack)    hx of per pt   Past Surgical History:  Procedure Laterality Date   ABDOMINAL HYSTERECTOMY     AORTIC VALVE REPLACEMENT N/A 08/23/2016   Procedure: AORTIC VALVE REPLACEMENT (AVR) - using partial Upper Sternotomy- 25mm Edwards Intuity Aortic Valve used;  Surgeon: Purcell Nails, MD;  [facility];  Service: Open Heart Surgery;  Laterality: N/A;   BLADDER SUSPENSION  2007   BREAST ENHANCEMENT SURGERY  1980   CARPAL TUNNEL RELEASE Bilateral    COLONOSCOPY     DORSAL COMPARTMENT RELEASE Right 09/15/2014   Procedure: RIGHT WRIST DEQUERVAINS RELEASE ;  Surgeon: Mckinley Jewel, MD;  Location: [facility];  Service: Orthopedics;  Laterality: Right;   DRUG INDUCED  ENDOSCOPY N/A 02/19/2022   Procedure: DRUG INDUCED SLEEP ENDOSCOPY;  Surgeon: Osborn Coho, MD;  Location: [facility];  Service: ENT;  Laterality: N/A;   ESOPHAGOGASTRODUODENOSCOPY     EYE SURGERY     cataract surgery   IMPLANTATION OF HYPOGLOSSAL NERVE STIMULATOR Right 04/23/2022   Procedure: IMPLANTATION OF HYPOGLOSSAL NERVE STIMULATOR;  Surgeon: Osborn Coho, MD;  Location: [facility];  Service: ENT;  Laterality: Right;   KNEE ARTHROSCOPY  04/15/2012   Procedure: ARTHROSCOPY KNEE;  Surgeon: Loreta Ave, MD;  Location: [facility] OR;  Service: Orthopedics;  Laterality: Right;   LAPAROSCOPIC CHOLECYSTECTOMY     LASIK     OOPHORECTOMY     PALATE TO GINGIVA GRAFT  2017   REVERSE SHOULDER ARTHROPLASTY Left 02/13/2021   Procedure: REVERSE SHOULDER ARTHROPLASTY;  Surgeon: Teryl Lucy, MD;  Location: [facility];  Service: Orthopedics;  Laterality: Left;   RIGHT/LEFT HEART CATH AND CORONARY ANGIOGRAPHY N/A 08/02/2016   Procedure: Right/Left Heart Cath and Coronary Angiography;  Surgeon: Tonny Bollman, MD;  Location: [facility] INVASIVE CV LAB;  Service: Cardiovascular;  Laterality: N/A;   ROTATOR CUFF REPAIR Left    SHOULDER ARTHROSCOPY WITH ROTATOR CUFF REPAIR  AND SUBACROMIAL DECOMPRESSION Right 12/24/2022   Procedure: SHOULDER ARTHROSCOPY WITH DEBRIDEMENT, ROTATOR CUFF REPAIR AND SUBACROMIAL DECOMPRESSION;  Surgeon: Teryl Lucy, MD;  Location: [facility];  Service: Orthopedics;  Laterality: Right;   TEE WITHOUT CARDIOVERSION N/A 08/23/2016   Procedure: TRANSESOPHAGEAL ECHOCARDIOGRAM (TEE);  Surgeon: Purcell Nails, MD;  Location: [facility] OR;  Service: Open Heart Surgery;  Laterality: N/A;   TOTAL KNEE ARTHROPLASTY  04/15/2012   Procedure: TOTAL KNEE ARTHROPLASTY;  Surgeon: Loreta Ave, MD;  Location: [facility] OR;  Service: Orthopedics;  Laterality: Right;   TRIGGER FINGER RELEASE Right 04/20/2015   Procedure: RIGHT TRIGGER FINGER RELEASE (TENDON SHEALTH INCISION) ;   Surgeon: Loreta Ave, MD;  Location: [facility];  Service: Orthopedics;  Laterality: Right;   Patient Active Problem List   Diagnosis Date Noted   Class 2 severe obesity with serious comorbidity and body mass index (BMI) of 36.0 to 36.9 in adult [facility]) 05/09/2022   Type 2 diabetes mellitus with obesity (HCC) 05/09/2022   Hypertension associated with type 2 diabetes mellitus (HCC) 05/09/2022   SOB (shortness of breath) 12/10/2021   S/P reverse total shoulder arthroplasty, left 02/13/2021   Idiopathic hyperphosphatasia 08/03/2019   Vitamin D deficiency 06/04/2018   Hyponatremia 02/17/2018   History of CVA in adulthood 08/13/2017   Blurry vision 08/13/2017   Primary hypertension 01/02/2017   S/P minimally invasive aortic valve replacement with bioprosthetic valve 08/23/2016   Diastolic dysfunction    Osteopenia 05/27/2016   OSA (obstructive sleep apnea) 02/27/2016   Bruxism 10/23/2015   Elevated alkaline phosphatase level 05/16/2015   Atypical chest pain 11/10/2014   Acne 05/12/2014   Diabetes mellitus (HCC) 05/12/2014   GERD (gastroesophageal reflux disease) 03/10/2014   Generalized anxiety disorder 03/10/2014   Migraines 03/10/2014   EUSTACHIAN TUBE DYSFUNCTION, CHRONIC 06/14/2010   Irritable bowel syndrome 12/15/2008   Low back pain 08/11/2008   Hyperlipidemia associated with type 2 diabetes mellitus (HCC) 02/09/2008   Allergic rhinitis 02/12/2007   Asthma 02/12/2007    PCP: Shelva Majestic, MD  REFERRING PROVIDER: Teryl Lucy, MD  REFERRING DIAG: Rt Shoulder Arthroscopy 12/24/22   THERAPY DIAG:  Stiffness of right shoulder, not elsewhere classified  Muscle weakness (generalized)  Localized edema  Acute postoperative pain of right shoulder  Rationale for Evaluation and Treatment: Rehabilitation  ONSET DATE: 12/24/22  SUBJECTIVE:                                                                                                                                                                                       SUBJECTIVE STATEMENT: doing okay for 7 weeks out. Doing HEP  Hand dominance: Right  PERTINENT HISTORY: Per order: S/P R shoulder arthroscopy with RC repair, distal clavicle excision and acromioplasty 12/24/22 Supraspinatus complete tear, biceps tendon with significant tearing Surgical: PROCEDURE: Right shoulder arthroscopy with extensive debridement of biceps tendon, superior labrum, subdeltoid bursa, with acromioplasty, distal clavicle resection, and supraspinatus repair    PAIN:  Are you having pain? Yes: NPRS scale: 4/10 Pain location: R shoulder Pain description: aching Aggravating factors: Has trouble getting comfortable at night. Relieving factors: Tramadol at  night, Tylenol in am  PRECAUTIONS: Shoulder  RED FLAGS: None   WEIGHT BEARING RESTRICTIONS: Patient reports none given  FALLS:  Has patient fallen in last 6 months? No  LIVING ENVIRONMENT: Lives with: lives with their family and lives with their spouse Lives in: House/apartment Stairs: Yes: External: 5 steps; on left going up Has following equipment at home: None  OCCUPATION: Retired  PLOF: Independent  PATIENT GOALS:Patient would like to regain her motion and strength in the R arm, be able to sleep  NEXT MD VISIT:   OBJECTIVE:   DIAGNOSTIC FINDINGS:  N/A  COGNITION: Overall cognitive status: Within functional limits for tasks assessed     SENSATION: WFL  POSTURE: Round shoulders, flexed posture, dowager's hump  UPPER EXTREMITY ROM:   P/A ROM Right eval Left eval  Shoulder flexion 50/105   Shoulder extension    Shoulder abduction 46/58   Shoulder adduction    Shoulder internal rotation WNL   Shoulder external rotation 30/45   Elbow flexion    Elbow extension    Wrist flexion    Wrist extension    Wrist ulnar deviation    Wrist radial deviation    Wrist pronation    Wrist supination    (Blank rows = not  tested)  UPPER EXTREMITY MMT: 3-/5 R shoulder. Elbow, wrist at least 3+/5   JOINT MOBILITY TESTING:  G-H deferred, scapula ROM limited, stiff  PALPATION:  TTP over supraspinatus, pects with tihgtness in pects and cervical paraspinals.   TODAY'S TREATMENT:                                                                                                                                         DATE:   02/13/23 PROM RT shld all directions Gentle joint mobs Gentle STW to shld and upper arm Finger ladder flex and abd 8 x each Ball rolling on mat AA cane ex standing 10 x each Yellow tband shld ext and row 10 x each VASO RT shld after for soreness and inflammation    02/10/23 Education Table slides into flex and abd, scap retraction, elbow, wrist, forearm exercises-active  PATIENT EDUCATION: Education details: POC Person educated: Patient Education method: Explanation Education comprehension: verbalized understanding  HOME EXERCISE PROGRAM: [id number]  ASSESSMENT:  CLINICAL IMPRESSION: Patient is a 72 y.o. who was seen today for physical therapy treatment after eval S/P Right shoulder arthroscopy with  extensive debridement of biceps tendon, superior labrum, subdeltoid bursa, with acromioplasty, distal clavicle resection, and supraspinatus repair on 12/24/22. She is 7 weeks out and doing very well ,tolerated PROM with minimal guarding and good ROM, progression of AA ex and scap stab with cuing OBJECTIVE IMPAIRMENTS: decreased activity tolerance, decreased balance, decreased coordination, decreased ROM, decreased strength, impaired flexibility, impaired UE functional use, improper body mechanics, postural dysfunction, and pain.   ACTIVITY LIMITATIONS: carrying, lifting, sleeping, and reach over head  PARTICIPATION LIMITATIONS: meal prep, cleaning, laundry, driving, shopping, and community activity  PERSONAL FACTORS: Past/current experiences are also affecting patient's functional  outcome.   REHAB POTENTIAL: Good  CLINICAL DECISION MAKING: Evolving/moderate complexity  EVALUATION COMPLEXITY: Moderate   GOALS: Goals reviewed with patient? Yes  SHORT TERM GOALS: Target date: 02/28/23  I with initial HEP Baseline: Goal status: 02/13/23 MET  LONG TERM GOALS: Target date: 05/05/23  I with final HEP Baseline:  Goal status: INITIAL  2.  Patient will recover at least 120 degrees of active R shoulder flexion and abduction Baseline: See ROM Goal status: INITIAL  3.  Increase RUE shoulder strength to at least 4/5 Baseline: 3-/5 Goal status: INITIAL  4.  Patient will report the ability to perform all of her normal daily activities with RU. Baseline: Severely limited functionally Goal status: INITIAL  5.  Decrease R shoulder pain to < 2/10 with all activity. Baseline: 4/10 Goal status: INITIAL  PLAN:  PT FREQUENCY: 1-2x/week  PT DURATION: 12 weeks  PLANNED INTERVENTIONS: Therapeutic exercises, Therapeutic activity, Neuromuscular re-education, Balance training, Gait training, Patient/Family education, Self Care, Joint mobilization, Dry Needling, Electrical stimulation, Cryotherapy, Moist heat, Vasopneumatic device, Ionotophoresis 4mg /ml Dexamethasone, and Manual therapy  PLAN FOR NEXT SESSION: AAROM, scapular stabilizing exercises.   Timmey Lamba,ANGIE, PTA 02/13/2023, 3:12 PM  [facility] at [facility]. [place], [place], [address] Phone: [phone]   Fax:  [phone]

## 2023-02-17 ENCOUNTER — Encounter: Payer: Self-pay | Admitting: Physical Therapy

## 2023-02-17 ENCOUNTER — Ambulatory Visit: Payer: Medicare PPO | Admitting: Physical Therapy

## 2023-02-17 DIAGNOSIS — M25611 Stiffness of right shoulder, not elsewhere classified: Secondary | ICD-10-CM | POA: Diagnosis not present

## 2023-02-17 DIAGNOSIS — Z9889 Other specified postprocedural states: Secondary | ICD-10-CM

## 2023-02-17 DIAGNOSIS — M6281 Muscle weakness (generalized): Secondary | ICD-10-CM | POA: Diagnosis not present

## 2023-02-17 DIAGNOSIS — M25511 Pain in right shoulder: Secondary | ICD-10-CM

## 2023-02-17 DIAGNOSIS — R600 Localized edema: Secondary | ICD-10-CM

## 2023-02-17 DIAGNOSIS — G8918 Other acute postprocedural pain: Secondary | ICD-10-CM | POA: Diagnosis not present

## 2023-02-17 DIAGNOSIS — M25811 Other specified joint disorders, right shoulder: Secondary | ICD-10-CM | POA: Diagnosis not present

## 2023-02-17 NOTE — Therapy (Signed)
OUTPATIENT PHYSICAL THERAPY SHOULDER EVALUATION   Patient Name: [name] MRN: [id number] DOB:1951/01/10, 72 y.o., female Today's Date: 02/17/2023  END OF SESSION:  PT End of Session - 02/17/23 1539     Visit Number 3    Date for PT Re-Evaluation 05/05/23    PT Start Time 1457    PT Stop Time 1540    PT Time Calculation (min) 43 min    Activity Tolerance Patient limited by pain    Behavior During Therapy [facility] for tasks assessed/performed              Past Medical History:  Diagnosis Date   Allergy    Anxiety    Arthritis    back & knee   Asthma     mild per pt shows up with resp illness   Colon polyps    Constipation    Diabetes mellitus without complication (HCC)    Gallstones    Gastric polyps    Gastroparesis    GERD (gastroesophageal reflux disease)    Headache    sinus headaches and migraines at times   Heart murmur    History of migraine headaches    HTN (hypertension)    Hyperlipidemia    IBS (irritable bowel syndrome)    Joint pain    Lumbar disc disease    PONV (postoperative nausea and vomiting)    S/P aortic valve replacement with bioprosthetic valve 08/23/2016   25 mm Edwards Intuity rapid-deployment bovine pericardial tissue valve via partial upper mini sternotomy   Sleep apnea    does not use CPAP   TIA (transient ischemic attack)    TIA (transient ischemic attack)    hx of per pt   Past Surgical History:  Procedure Laterality Date   ABDOMINAL HYSTERECTOMY     AORTIC VALVE REPLACEMENT N/A 08/23/2016   Procedure: AORTIC VALVE REPLACEMENT (AVR) - using partial Upper Sternotomy- 25mm Edwards Intuity Aortic Valve used;  Surgeon: Purcell Nails, MD;  Location: [facility];  Service: Open Heart Surgery;  Laterality: N/A;   BLADDER SUSPENSION  2007   BREAST ENHANCEMENT SURGERY  1980   CARPAL TUNNEL RELEASE Bilateral    COLONOSCOPY     DORSAL COMPARTMENT RELEASE Right 09/15/2014   Procedure: RIGHT WRIST DEQUERVAINS RELEASE ;  Surgeon: Mckinley Jewel, MD;  Location: [facility];  Service: Orthopedics;  Laterality: Right;   DRUG INDUCED ENDOSCOPY N/A 02/19/2022   Procedure: DRUG INDUCED SLEEP ENDOSCOPY;  Surgeon: Osborn Coho, MD;  Location: [facility];  Service: ENT;  Laterality: N/A;   ESOPHAGOGASTRODUODENOSCOPY     EYE SURGERY     cataract surgery   IMPLANTATION OF HYPOGLOSSAL NERVE STIMULATOR Right 04/23/2022   Procedure: IMPLANTATION OF HYPOGLOSSAL NERVE STIMULATOR;  Surgeon: Osborn Coho, MD;  Location: [facility];  Service: ENT;  Laterality: Right;   KNEE ARTHROSCOPY  04/15/2012   Procedure: ARTHROSCOPY KNEE;  Surgeon: Loreta Ave, MD;  Location: [facility] OR;  Service: Orthopedics;  Laterality: Right;   LAPAROSCOPIC CHOLECYSTECTOMY     LASIK     OOPHORECTOMY     PALATE TO GINGIVA GRAFT  2017   REVERSE SHOULDER ARTHROPLASTY Left 02/13/2021   Procedure: REVERSE SHOULDER ARTHROPLASTY;  Surgeon: Teryl Lucy, MD;  Location: [facility];  Service: Orthopedics;  Laterality: Left;   RIGHT/LEFT HEART CATH AND CORONARY ANGIOGRAPHY N/A 08/02/2016   Procedure: Right/Left Heart Cath and Coronary Angiography;  Surgeon: Tonny Bollman, MD;  Location: [facility] INV[facility]IVE CV LAB;  Service: Cardiovascular;  Laterality: N/A;   ROTATOR CUFF REPAIR Left    SHOULDER ARTHROSCOPY WITH ROTATOR CUFF REPAIR AND SUBACROMIAL DECOMPRESSION Right 12/24/2022   Procedure: SHOULDER ARTHROSCOPY WITH DEBRIDEMENT, ROTATOR CUFF REPAIR AND SUBACROMIAL DECOMPRESSION;  Surgeon: Teryl Lucy, MD;  Location: [facility];  Service: Orthopedics;  Laterality: Right;   TEE WITHOUT CARDIOVERSION N/A 08/23/2016   Procedure: TRANSESOPHAGEAL ECHOCARDIOGRAM (TEE);  Surgeon: Purcell Nails, MD;  Location: [facility] OR;  Service: Open Heart Surgery;  Laterality: N/A;   TOTAL KNEE ARTHROPLASTY  04/15/2012   Procedure: TOTAL KNEE ARTHROPLASTY;  Surgeon: Loreta Ave, MD;  Location: [facility] OR;  Service: Orthopedics;  Laterality: Right;    TRIGGER FINGER RELEASE Right 04/20/2015   Procedure: RIGHT TRIGGER FINGER RELEASE (TENDON SHEALTH INCISION) ;  Surgeon: Loreta Ave, MD;  Location: [facility];  Service: Orthopedics;  Laterality: Right;   Patient Active Problem List   Diagnosis Date Noted   Class 2 severe obesity with serious comorbidity and body mass index (BMI) of 36.0 to 36.9 in adult (HCC) 05/09/2022   Type 2 diabetes mellitus with obesity (HCC) 05/09/2022   Hypertension associated with type 2 diabetes mellitus (HCC) 05/09/2022   SOB (shortness of breath) 12/10/2021   S/P reverse total shoulder arthroplasty, left 02/13/2021   Idiopathic hyperphosphatasia 08/03/2019   Vitamin D deficiency 06/04/2018   Hyponatremia 02/17/2018   History of CVA in adulthood 08/13/2017   Blurry vision 08/13/2017   Primary hypertension 01/02/2017   S/P minimally invasive aortic valve replacement with bioprosthetic valve 08/23/2016   Diastolic dysfunction    Osteopenia 05/27/2016   OSA (obstructive sleep apnea) 02/27/2016   Bruxism 10/23/2015   Elevated alkaline phosphatase level 05/16/2015   Atypical chest pain 11/10/2014   Acne 05/12/2014   Diabetes mellitus (HCC) 05/12/2014   GERD (gastroesophageal reflux disease) 03/10/2014   Generalized anxiety disorder 03/10/2014   Migraines 03/10/2014   EUSTACHIAN TUBE DYSFUNCTION, CHRONIC 06/14/2010   Irritable bowel syndrome 12/15/2008   Low back pain 08/11/2008   Hyperlipidemia associated with type 2 diabetes mellitus (HCC) 02/09/2008   Allergic rhinitis 02/12/2007   Asthma 02/12/2007    PCP: Shelva Majestic, MD  REFERRING PROVIDER: Teryl Lucy, MD  REFERRING DIAG: Rt Shoulder Arthroscopy 12/24/22   THERAPY DIAG:  Stiffness of right shoulder, not elsewhere classified  Muscle weakness (generalized)  Localized edema  Acute postoperative pain of right shoulder  History of arthroscopy of right shoulder  Impingement of right shoulder  Rationale for  Evaluation and Treatment: Rehabilitation  ONSET DATE: 12/24/22  SUBJECTIVE:  SUBJECTIVE STATEMENT: Patient reports no problems, feels she is slowly gaining strength and ROM   Hand dominance: Right  PERTINENT HISTORY: Per order: S/P R shoulder arthroscopy with RC repair, distal clavicle excision and acromioplasty 12/24/22 Supraspinatus complete tear, biceps tendon with significant tearing Surgical: PROCEDURE: Right shoulder arthroscopy with extensive debridement of biceps tendon, superior labrum, subdeltoid bursa, with acromioplasty, distal clavicle resection, and supraspinatus repair    PAIN:  Are you having pain? Yes: NPRS scale: 4/10 Pain location: R shoulder Pain description: aching Aggravating factors: Has trouble getting comfortable at night. Relieving factors: Tramadol at  night, Tylenol in am  PRECAUTIONS: Shoulder  RED FLAGS: None   WEIGHT BEARING RESTRICTIONS: Patient reports none given  FALLS:  Has patient fallen in last 6 months? No  LIVING ENVIRONMENT: Lives with: lives with their family and lives with their spouse Lives in: House/apartment Stairs: Yes: External: 5 steps; on left going up Has following equipment at home: None  OCCUPATION: Retired  PLOF: Independent  PATIENT GOALS:Patient would like to regain her motion and strength in the R arm, be able to sleep  NEXT MD VISIT:   OBJECTIVE:   DIAGNOSTIC FINDINGS:  N/A  COGNITION: Overall cognitive status: Within functional limits for tasks assessed     SENSATION: WFL  POSTURE: Round shoulders, flexed posture, dowager's hump  UPPER EXTREMITY ROM:   P/A ROM Right eval Left eval  Shoulder flexion 50/105   Shoulder extension    Shoulder abduction 46/58   Shoulder adduction    Shoulder internal rotation WNL    Shoulder external rotation 30/45   Elbow flexion    Elbow extension    Wrist flexion    Wrist extension    Wrist ulnar deviation    Wrist radial deviation    Wrist pronation    Wrist supination    (Blank rows = not tested)  UPPER EXTREMITY MMT: 3-/5 R shoulder. Elbow, wrist at least 3+/5   JOINT MOBILITY TESTING:  G-H deferred, scapula ROM limited, stiff  PALPATION:  TTP over supraspinatus, pects with tihgtness in pects and cervical paraspinals.   TODAY'S TREATMENT:                                                                                                                                         DATE:  02/17/23 UBE L1 x 3 min for and back Finger ladder x 10 in flex and abd AAROM with dowel for flex, abd, ext, IR/ER x 10 each Cabinet taps in flex to second shelf and abd to first shelf x 10 each Ball on wall serratus press, circles x 10 in each direction, VC and TC to slow down and maintain pressure. STM to pects and UT on R with caudal distraction, scapular mobs, and P stretch into flex, abd, ER VASO x 10 minutes to R shoulder, med pressure, for pain and inflammation  02/13/23 PROM RT shld  all directions Gentle joint mobs Gentle STW to shld and upper arm Finger ladder flex and abd 8 x each Ball rolling on mat AA cane ex standing 10 x each Yellow tband shld ext and row 10 x each VASO RT shld after for soreness and inflammation  02/10/23 Education Table slides into flex and abd, scap retraction, elbow, wrist, forearm exercises-active  PATIENT EDUCATION: Education details: POC Person educated: Patient Education method: Explanation Education comprehension: verbalized understanding  HOME EXERCISE PROGRAM: [id number]  ASSESSMENT:  CLINICAL IMPRESSION: Patient is a 72 y.o. who was seen today for physical therapy treatment after eval S/P Right shoulder arthroscopy with extensive debridement of biceps tendon, superior labrum, subdeltoid bursa, with acromioplasty,  distal clavicle resection, and supraspinatus repair on 12/24/22. She is 7 weeks out and doing very well. Treatment focused on AAROM, PROM and AROM of R shoulder in all planes, STM and stretch with joint mobs to avoid impingement, and scapular stabilization exercises. She is ready to introduce additional gentle strengthening.  OBJECTIVE IMPAIRMENTS: decreased activity tolerance, decreased balance, decreased coordination, decreased ROM, decreased strength, impaired flexibility, impaired UE functional use, improper body mechanics, postural dysfunction, and pain.   ACTIVITY LIMITATIONS: carrying, lifting, sleeping, and reach over head  PARTICIPATION LIMITATIONS: meal prep, cleaning, laundry, driving, shopping, and community activity  PERSONAL FACTORS: Past/current experiences are also affecting patient's functional outcome.   REHAB POTENTIAL: Good  CLINICAL DECISION MAKING: Evolving/moderate complexity  EVALUATION COMPLEXITY: Moderate   GOALS: Goals reviewed with patient? Yes  SHORT TERM GOALS: Target date: 02/28/23  I with initial HEP Baseline: Goal status: 02/13/23 MET  LONG TERM GOALS: Target date: 05/05/23  I with final HEP Baseline:  Goal status: INITIAL  2.  Patient will recover at least 120 degrees of active R shoulder flexion and abduction Baseline: See ROM Goal status: INITIAL  3.  Increase RUE shoulder strength to at least 4/5 Baseline: 3-/5 Goal status: INITIAL  4.  Patient will report the ability to perform all of her normal daily activities with RU. Baseline: Severely limited functionally Goal status: INITIAL  5.  Decrease R shoulder pain to < 2/10 with all activity. Baseline: 4/10 Goal status: INITIAL  PLAN:  PT FREQUENCY: 1-2x/week  PT DURATION: 12 weeks  PLANNED INTERVENTIONS: Therapeutic exercises, Therapeutic activity, Neuromuscular re-education, Balance training, Gait training, Patient/Family education, Self Care, Joint mobilization, Dry Needling,  Electrical stimulation, Cryotherapy, Moist heat, Vasopneumatic device, Ionotophoresis 4mg /ml Dexamethasone, and Manual therapy  PLAN FOR NEXT SESSION: AAROM, scapular stabilizing exercises.   Iona Beard, DPT 02/17/2023, 3:51 PM  [facility] at [facility]. [place], [place], [address] Phone: [phone]   Fax:  [phone]

## 2023-02-20 ENCOUNTER — Ambulatory Visit: Payer: Medicare PPO | Admitting: Physical Therapy

## 2023-02-20 DIAGNOSIS — M25611 Stiffness of right shoulder, not elsewhere classified: Secondary | ICD-10-CM | POA: Diagnosis not present

## 2023-02-20 DIAGNOSIS — Z9889 Other specified postprocedural states: Secondary | ICD-10-CM | POA: Diagnosis not present

## 2023-02-20 DIAGNOSIS — M6281 Muscle weakness (generalized): Secondary | ICD-10-CM | POA: Diagnosis not present

## 2023-02-20 DIAGNOSIS — R600 Localized edema: Secondary | ICD-10-CM | POA: Diagnosis not present

## 2023-02-20 DIAGNOSIS — G8918 Other acute postprocedural pain: Secondary | ICD-10-CM | POA: Diagnosis not present

## 2023-02-20 DIAGNOSIS — M25811 Other specified joint disorders, right shoulder: Secondary | ICD-10-CM | POA: Diagnosis not present

## 2023-02-20 DIAGNOSIS — M25511 Pain in right shoulder: Secondary | ICD-10-CM | POA: Diagnosis not present

## 2023-02-20 NOTE — Therapy (Signed)
OUTPATIENT PHYSICAL THERAPY SHOULDER    Patient Name: [name] MRN: [id number] DOB:05/18/1951, 73 y.o., female Today's Date: 02/20/2023  END OF SESSION:  PT End of Session - 02/20/23 1511     Visit Number 4    Date for PT Re-Evaluation 05/05/23    PT Start Time 1511    PT Stop Time 1610    PT Time Calculation (min) 59 min              Past Medical History:  Diagnosis Date   Allergy    Anxiety    Arthritis    back & knee   Asthma     mild per pt shows up with resp illness   Colon polyps    Constipation    Diabetes mellitus without complication (HCC)    Gallstones    Gastric polyps    Gastroparesis    GERD (gastroesophageal reflux disease)    Headache    sinus headaches and migraines at times   Heart murmur    History of migraine headaches    HTN (hypertension)    Hyperlipidemia    IBS (irritable bowel syndrome)    Joint pain    Lumbar disc disease    PONV (postoperative nausea and vomiting)    S/P aortic valve replacement with bioprosthetic valve 08/23/2016   25 mm Edwards Intuity rapid-deployment bovine pericardial tissue valve via partial upper mini sternotomy   Sleep apnea    does not use CPAP   TIA (transient ischemic attack)    TIA (transient ischemic attack)    hx of per pt   Past Surgical History:  Procedure Laterality Date   ABDOMINAL HYSTERECTOMY     AORTIC VALVE REPLACEMENT N/A 08/23/2016   Procedure: AORTIC VALVE REPLACEMENT (AVR) - using partial Upper Sternotomy- 25mm Edwards Intuity Aortic Valve used;  Surgeon: Purcell Nails, MD;  Location: [facility] OR;  Service: Open Heart Surgery;  Laterality: N/A;   BLADDER SUSPENSION  2007   BREAST ENHANCEMENT SURGERY  1980   CARPAL TUNNEL RELEASE Bilateral    COLONOSCOPY     DORSAL COMPARTMENT RELEASE Right 09/15/2014   Procedure: RIGHT WRIST DEQUERVAINS RELEASE ;  Surgeon: Mckinley Jewel, MD;  Location: [facility];  Service: Orthopedics;  Laterality: Right;   DRUG INDUCED ENDOSCOPY  N/A 02/19/2022   Procedure: DRUG INDUCED SLEEP ENDOSCOPY;  Surgeon: Osborn Coho, MD;  Location: [facility];  Service: ENT;  Laterality: N/A;   ESOPHAGOGASTRODUODENOSCOPY     EYE SURGERY     cataract surgery   IMPLANTATION OF HYPOGLOSSAL NERVE STIMULATOR Right 04/23/2022   Procedure: IMPLANTATION OF HYPOGLOSSAL NERVE STIMULATOR;  Surgeon: Osborn Coho, MD;  Location: [facility];  Service: ENT;  Laterality: Right;   KNEE ARTHROSCOPY  04/15/2012   Procedure: ARTHROSCOPY KNEE;  Surgeon: Loreta Ave, MD;  Location: [facility] OR;  Service: Orthopedics;  Laterality: Right;   LAPAROSCOPIC CHOLECYSTECTOMY     LASIK     OOPHORECTOMY     PALATE TO GINGIVA GRAFT  2017   REVERSE SHOULDER ARTHROPLASTY Left 02/13/2021   Procedure: REVERSE SHOULDER ARTHROPLASTY;  Surgeon: Teryl Lucy, MD;  Location: [facility];  Service: Orthopedics;  Laterality: Left;   RIGHT/LEFT HEART CATH AND CORONARY ANGIOGRAPHY N/A 08/02/2016   Procedure: Right/Left Heart Cath and Coronary Angiography;  Surgeon: Tonny Bollman, MD;  Location: [facility];  Service: Cardiovascular;  Laterality: N/A;   ROTATOR CUFF REPAIR Left    SHOULDER ARTHROSCOPY WITH ROTATOR CUFF  REPAIR AND SUBACROMIAL DECOMPRESSION Right 12/24/2022   Procedure: SHOULDER ARTHROSCOPY WITH DEBRIDEMENT, ROTATOR CUFF REPAIR AND SUBACROMIAL DECOMPRESSION;  Surgeon: Teryl Lucy, MD;  Location: [facility];  Service: Orthopedics;  Laterality: Right;   TEE WITHOUT CARDIOVERSION N/A 08/23/2016   Procedure: TRANSESOPHAGEAL ECHOCARDIOGRAM (TEE);  Surgeon: Purcell Nails, MD;  Location: [facility] OR;  Service: Open Heart Surgery;  Laterality: N/A;   TOTAL KNEE ARTHROPLASTY  04/15/2012   Procedure: TOTAL KNEE ARTHROPLASTY;  Surgeon: Loreta Ave, MD;  Location: [facility] OR;  Service: Orthopedics;  Laterality: Right;   TRIGGER FINGER RELEASE Right 04/20/2015   Procedure: RIGHT TRIGGER FINGER RELEASE (TENDON SHEALTH INCISION) ;  Surgeon:  Loreta Ave, MD;  Location: [facility];  Service: Orthopedics;  Laterality: Right;   Patient Active Problem List   Diagnosis Date Noted   Class 2 severe obesity with serious comorbidity and body mass index (BMI) of 36.0 to 36.9 in adult (HCC) 05/09/2022   Type 2 diabetes mellitus with obesity (HCC) 05/09/2022   Hypertension associated with type 2 diabetes mellitus (HCC) 05/09/2022   SOB (shortness of breath) 12/10/2021   S/P reverse total shoulder arthroplasty, left 02/13/2021   Idiopathic hyperphosphatasia 08/03/2019   Vitamin D deficiency 06/04/2018   Hyponatremia 02/17/2018   History of CVA in adulthood 08/13/2017   Blurry vision 08/13/2017   Primary hypertension 01/02/2017   S/P minimally invasive aortic valve replacement with bioprosthetic valve 08/23/2016   Diastolic dysfunction    Osteopenia 05/27/2016   OSA (obstructive sleep apnea) 02/27/2016   Bruxism 10/23/2015   Elevated alkaline phosphatase level 05/16/2015   Atypical chest pain 11/10/2014   Acne 05/12/2014   Diabetes mellitus (HCC) 05/12/2014   GERD (gastroesophageal reflux disease) 03/10/2014   Generalized anxiety disorder 03/10/2014   Migraines 03/10/2014   EUSTACHIAN TUBE DYSFUNCTION, CHRONIC 06/14/2010   Irritable bowel syndrome 12/15/2008   Low back pain 08/11/2008   Hyperlipidemia associated with type 2 diabetes mellitus (HCC) 02/09/2008   Allergic rhinitis 02/12/2007   Asthma 02/12/2007    PCP: Shelva Majestic, MD  REFERRING PROVIDER: Teryl Lucy, MD  REFERRING DIAG: Rt Shoulder Arthroscopy 12/24/22   THERAPY DIAG:  Stiffness of right shoulder, not elsewhere classified  Muscle weakness (generalized)  Localized edema  Rationale for Evaluation and Treatment: Rehabilitation  ONSET DATE: 12/24/22  SUBJECTIVE:                                                                                                                                                                                       SUBJECTIVE STATEMENT: very sore from STW on front of shld last session,other than that did well Hand dominance:  Right  PERTINENT HISTORY: Per order: S/P R shoulder arthroscopy with RC repair, distal clavicle excision and acromioplasty 12/24/22 Supraspinatus complete tear, biceps tendon with significant tearing Surgical: PROCEDURE: Right shoulder arthroscopy with extensive debridement of biceps tendon, superior labrum, subdeltoid bursa, with acromioplasty, distal clavicle resection, and supraspinatus repair    PAIN:  Are you having pain? Yes: NPRS scale: 4/10 Pain location: R shoulder Pain description: aching Aggravating factors: Has trouble getting comfortable at night. Relieving factors: Tramadol at  night, Tylenol in am  PRECAUTIONS: Shoulder  RED FLAGS: None   WEIGHT BEARING RESTRICTIONS: Patient reports none given  FALLS:  Has patient fallen in last 6 months? No  LIVING ENVIRONMENT: Lives with: lives with their family and lives with their spouse Lives in: House/apartment Stairs: Yes: External: 5 steps; on left going up Has following equipment at home: None  OCCUPATION: Retired  PLOF: Independent  PATIENT GOALS:Patient would like to regain her motion and strength in the R arm, be able to sleep  NEXT MD VISIT:   OBJECTIVE:   DIAGNOSTIC FINDINGS:  N/A  COGNITION: Overall cognitive status: Within functional limits for tasks assessed     SENSATION: WFL  POSTURE: Round shoulders, flexed posture, dowager's hump  UPPER EXTREMITY ROM:   P/A ROM Right eval Left eval AA/Act on finger ladder  Standing 02/20/23 RT  Shoulder flexion 50/105  130 / 115  Shoulder extension     Shoulder abduction 46/58  120/ 95  Shoulder adduction     Shoulder internal rotation WNL    Shoulder external rotation 30/45    Elbow flexion     Elbow extension     Wrist flexion     Wrist extension     Wrist ulnar deviation     Wrist radial deviation     Wrist pronation      Wrist supination     (Blank rows = not tested)  UPPER EXTREMITY MMT: 3-/5 R shoulder. Elbow, wrist at least 3+/5   JOINT MOBILITY TESTING:  G-H deferred, scapula ROM limited, stiff  PALPATION:  TTP over supraspinatus, pects with tihgtness in pects and cervical paraspinals.   TODAY'S TREATMENT:                                                                                                                                         DATE:   02/20/23 UBE L 2 3 min each way Finger ladder x 10 in flex and abd RT UE  AAROM with dowel for flex, abd, ext, IR/ER x 10 each Cabinet taps in flex to second shelf and abd to 2nd shelf x 10 each Yellow tband shld ext,row and ER 2 sets 10 STM to pects and UT on R with caudal distraction, scapular mobs, and P stretch into flex, abd, ER VASO x 10 minutes to R shoulder, med pressure, for pain and inflammation   02/17/23 UBE L1 x  3 min for and back Finger ladder x 10 in flex and abd AAROM with dowel for flex, abd, ext, IR/ER x 10 each Cabinet taps in flex to second shelf and abd to first shelf x 10 each Ball on wall serratus press, circles x 10 in each direction, VC and TC to slow down and maintain pressure. STM to pects and UT on R with caudal distraction, scapular mobs, and P stretch into flex, abd, ER VASO x 10 minutes to R shoulder, med pressure, for pain and inflammation  02/13/23 PROM RT shld all directions Gentle joint mobs Gentle STW to shld and upper arm Finger ladder flex and abd 8 x each Ball rolling on mat AA cane ex standing 10 x each Yellow tband shld ext and row 10 x each VASO RT shld after for soreness and inflammation  02/10/23 Education Table slides into flex and abd, scap retraction, elbow, wrist, forearm exercises-active  PATIENT EDUCATION: Education details: POC Person educated: Patient Education method: Explanation Education comprehension: verbalized understanding  HOME EXERCISE  PROGRAM: [id number]  ASSESSMENT:  CLINICAL IMPRESSION: progressing P/AA and AROM.STG met OBJECTIVE IMPAIRMENTS: decreased activity tolerance, decreased balance, decreased coordination, decreased ROM, decreased strength, impaired flexibility, impaired UE functional use, improper body mechanics, postural dysfunction, and pain.   ACTIVITY LIMITATIONS: carrying, lifting, sleeping, and reach over head  PARTICIPATION LIMITATIONS: meal prep, cleaning, laundry, driving, shopping, and community activity  PERSONAL FACTORS: Past/current experiences are also affecting patient's functional outcome.   REHAB POTENTIAL: Good  CLINICAL DECISION MAKING: Evolving/moderate complexity  EVALUATION COMPLEXITY: Moderate   GOALS: Goals reviewed with patient? Yes  SHORT TERM GOALS: Target date: 02/28/23  I with initial HEP Baseline: Goal status: 02/13/23 MET  LONG TERM GOALS: Target date: 05/05/23  I with final HEP Baseline:  Goal status: INITIAL  2.  Patient will recover at least 120 degrees of active R shoulder flexion and abduction Baseline: See ROM Goal status: 02/20/23 progressing  3.  Increase RUE shoulder strength to at least 4/5 Baseline: 3-/5 Goal status: INITIAL  4.  Patient will report the ability to perform all of her normal daily activities with RU. Baseline: Severely limited functionally Goal status: INITIAL  5.  Decrease R shoulder pain to < 2/10 with all activity. Baseline: 4/10 Goal status: INITIAL  PLAN:  PT FREQUENCY: 1-2x/week  PT DURATION: 12 weeks  PLANNED INTERVENTIONS: Therapeutic exercises, Therapeutic activity, Neuromuscular re-education, Balance training, Gait training, Patient/Family education, Self Care, Joint mobilization, Dry Needling, Electrical stimulation, Cryotherapy, Moist heat, Vasopneumatic device, Ionotophoresis 4mg /ml Dexamethasone, and Manual therapy  PLAN FOR NEXT SESSION: AAROM, scapular stabilizing exercises.   Patient Details  Name:  [name] MRN: [id number] Date of Birth: 1951/05/25 Referring Provider:  Teryl Lucy, MD  Encounter Date: 02/20/2023   Suanne Marker, PTA 02/20/2023, 3:12 PM  [facility] [address] [place], [place], [address] Phone: [phone]   Fax:  [phone]

## 2023-02-25 ENCOUNTER — Ambulatory Visit: Payer: Medicare PPO | Attending: Orthopedic Surgery | Admitting: Physical Therapy

## 2023-02-25 ENCOUNTER — Ambulatory Visit: Payer: Medicare PPO | Admitting: Physical Therapy

## 2023-02-25 DIAGNOSIS — M6281 Muscle weakness (generalized): Secondary | ICD-10-CM | POA: Diagnosis not present

## 2023-02-25 DIAGNOSIS — M25511 Pain in right shoulder: Secondary | ICD-10-CM | POA: Diagnosis not present

## 2023-02-25 DIAGNOSIS — G8918 Other acute postprocedural pain: Secondary | ICD-10-CM | POA: Diagnosis not present

## 2023-02-25 DIAGNOSIS — Z9889 Other specified postprocedural states: Secondary | ICD-10-CM | POA: Diagnosis not present

## 2023-02-25 DIAGNOSIS — M25611 Stiffness of right shoulder, not elsewhere classified: Secondary | ICD-10-CM | POA: Insufficient documentation

## 2023-02-25 DIAGNOSIS — R600 Localized edema: Secondary | ICD-10-CM | POA: Insufficient documentation

## 2023-02-25 DIAGNOSIS — M25811 Other specified joint disorders, right shoulder: Secondary | ICD-10-CM | POA: Diagnosis not present

## 2023-02-25 NOTE — Therapy (Signed)
OUTPATIENT PHYSICAL THERAPY SHOULDER    Patient Name: [name] MRN: [id number] DOB:12/12/1950, 72 y.o., female Today's Date: 02/25/2023  END OF SESSION:  PT End of Session - 02/25/23 1129     Visit Number 5    Date for PT Re-Evaluation 05/05/23    PT Start Time 1140    PT Stop Time 1230    PT Time Calculation (min) 50 min              Past Medical History:  Diagnosis Date   Allergy    Anxiety    Arthritis    back & knee   Asthma     mild per pt shows up with resp illness   Colon polyps    Constipation    Diabetes mellitus without complication (HCC)    Gallstones    Gastric polyps    Gastroparesis    GERD (gastroesophageal reflux disease)    Headache    sinus headaches and migraines at times   Heart murmur    History of migraine headaches    HTN (hypertension)    Hyperlipidemia    IBS (irritable bowel syndrome)    Joint pain    Lumbar disc disease    PONV (postoperative nausea and vomiting)    S/P aortic valve replacement with bioprosthetic valve 08/23/2016   25 mm Edwards Intuity rapid-deployment bovine pericardial tissue valve via partial upper mini sternotomy   Sleep apnea    does not use CPAP   TIA (transient ischemic attack)    TIA (transient ischemic attack)    hx of per pt   Past Surgical History:  Procedure Laterality Date   ABDOMINAL HYSTERECTOMY     AORTIC VALVE REPLACEMENT N/A 08/23/2016   Procedure: AORTIC VALVE REPLACEMENT (AVR) - using partial Upper Sternotomy- 25mm Edwards Intuity Aortic Valve used;  Surgeon: Purcell Nails, MD;  Location: [facility] OR;  Service: Open Heart Surgery;  Laterality: N/A;   BLADDER SUSPENSION  2007   BREAST ENHANCEMENT SURGERY  1980   CARPAL TUNNEL RELEASE Bilateral    COLONOSCOPY     DORSAL COMPARTMENT RELEASE Right 09/15/2014   Procedure: RIGHT WRIST DEQUERVAINS RELEASE ;  Surgeon: Mckinley Jewel, MD;  Location: [facility];  Service: Orthopedics;  Laterality: Right;   DRUG INDUCED ENDOSCOPY  N/A 02/19/2022   Procedure: DRUG INDUCED SLEEP ENDOSCOPY;  Surgeon: Osborn Coho, MD;  Location: [facility];  Service: ENT;  Laterality: N/A;   ESOPHAGOGASTRODUODENOSCOPY     EYE SURGERY     cataract surgery   IMPLANTATION OF HYPOGLOSSAL NERVE STIMULATOR Right 04/23/2022   Procedure: IMPLANTATION OF HYPOGLOSSAL NERVE STIMULATOR;  Surgeon: Osborn Coho, MD;  Location: [facility];  Service: ENT;  Laterality: Right;   KNEE ARTHROSCOPY  04/15/2012   Procedure: ARTHROSCOPY KNEE;  Surgeon: Loreta Ave, MD;  Location: [facility] OR;  Service: Orthopedics;  Laterality: Right;   LAPAROSCOPIC CHOLECYSTECTOMY     LASIK     OOPHORECTOMY     PALATE TO GINGIVA GRAFT  2017   REVERSE SHOULDER ARTHROPLASTY Left 02/13/2021   Procedure: REVERSE SHOULDER ARTHROPLASTY;  Surgeon: Teryl Lucy, MD;  Location: [facility];  Service: Orthopedics;  Laterality: Left;   RIGHT/LEFT HEART CATH AND CORONARY ANGIOGRAPHY N/A 08/02/2016   Procedure: Right/Left Heart Cath and Coronary Angiography;  Surgeon: Tonny Bollman, MD;  Location: [facility] INVASIVE [facility];  Service: Cardiovascular;  Laterality: N/A;   ROTATOR CUFF REPAIR Left    SHOULDER ARTHROSCOPY WITH ROTATOR CUFF  REPAIR AND SUBACROMIAL DECOMPRESSION Right 12/24/2022   Procedure: SHOULDER ARTHROSCOPY WITH DEBRIDEMENT, ROTATOR CUFF REPAIR AND SUBACROMIAL DECOMPRESSION;  Surgeon: Teryl Lucy, MD;  Location: [facility];  Service: Orthopedics;  Laterality: Right;   TEE WITHOUT CARDIOVERSION N/A 08/23/2016   Procedure: TRANSESOPHAGEAL ECHOCARDIOGRAM (TEE);  Surgeon: Purcell Nails, MD;  Location: [facility] OR;  Service: Open Heart Surgery;  Laterality: N/A;   TOTAL KNEE ARTHROPLASTY  04/15/2012   Procedure: TOTAL KNEE ARTHROPLASTY;  Surgeon: Loreta Ave, MD;  Location: [facility] OR;  Service: Orthopedics;  Laterality: Right;   TRIGGER FINGER RELEASE Right 04/20/2015   Procedure: RIGHT TRIGGER FINGER RELEASE (TENDON SHEALTH INCISION) ;  Surgeon:  Loreta Ave, MD;  Location: [facility];  Service: Orthopedics;  Laterality: Right;   Patient Active Problem List   Diagnosis Date Noted   Class 2 severe obesity with serious comorbidity and body mass index (BMI) of 36.0 to 36.9 in adult (HCC) 05/09/2022   Type 2 diabetes mellitus with obesity (HCC) 05/09/2022   Hypertension associated with type 2 diabetes mellitus (HCC) 05/09/2022   SOB (shortness of breath) 12/10/2021   S/P reverse total shoulder arthroplasty, left 02/13/2021   Idiopathic hyperphosphatasia 08/03/2019   Vitamin D deficiency 06/04/2018   Hyponatremia 02/17/2018   History of CVA in adulthood 08/13/2017   Blurry vision 08/13/2017   Primary hypertension 01/02/2017   S/P minimally invasive aortic valve replacement with bioprosthetic valve 08/23/2016   Diastolic dysfunction    Osteopenia 05/27/2016   OSA (obstructive sleep apnea) 02/27/2016   Bruxism 10/23/2015   Elevated alkaline phosphatase level 05/16/2015   Atypical chest pain 11/10/2014   Acne 05/12/2014   Diabetes mellitus (HCC) 05/12/2014   GERD (gastroesophageal reflux disease) 03/10/2014   Generalized anxiety disorder 03/10/2014   Migraines 03/10/2014   EUSTACHIAN TUBE DYSFUNCTION, CHRONIC 06/14/2010   Irritable bowel syndrome 12/15/2008   Low back pain 08/11/2008   Hyperlipidemia associated with type 2 diabetes mellitus (HCC) 02/09/2008   Allergic rhinitis 02/12/2007   Asthma 02/12/2007    PCP: Shelva Majestic, MD  REFERRING PROVIDER: Teryl Lucy, MD  REFERRING DIAG: Rt Shoulder Arthroscopy 12/24/22   THERAPY DIAG:  Stiffness of right shoulder, not elsewhere classified  Muscle weakness (generalized)  Localized edema  Rationale for Evaluation and Treatment: Rehabilitation  ONSET DATE: 12/24/22  SUBJECTIVE:                                                                                                                                                                                       SUBJECTIVE STATEMENT: definitely using it more at home and I feel it more Hand dominance: Right  PERTINENT  HISTORY: Per order: S/P R shoulder arthroscopy with RC repair, distal clavicle excision and acromioplasty 12/24/22 Supraspinatus complete tear, biceps tendon with significant tearing Surgical: PROCEDURE: Right shoulder arthroscopy with extensive debridement of biceps tendon, superior labrum, subdeltoid bursa, with acromioplasty, distal clavicle resection, and supraspinatus repair    PAIN:  Are you having pain? Yes: NPRS scale: 4/10 Pain location: R shoulder Pain description: aching Aggravating factors: Has trouble getting comfortable at night. Relieving factors: Tramadol at  night, Tylenol in am  PRECAUTIONS: Shoulder  RED FLAGS: None   WEIGHT BEARING RESTRICTIONS: Patient reports none given  FALLS:  Has patient fallen in last 6 months? No  LIVING ENVIRONMENT: Lives with: lives with their family and lives with their spouse Lives in: House/apartment Stairs: Yes: External: 5 steps; on left going up Has following equipment at home: None  OCCUPATION: Retired  PLOF: Independent  PATIENT GOALS:Patient would like to regain her motion and strength in the R arm, be able to sleep  NEXT MD VISIT:   OBJECTIVE:   DIAGNOSTIC FINDINGS:  N/A  COGNITION: Overall cognitive status: Within functional limits for tasks assessed     SENSATION: WFL  POSTURE: Round shoulders, flexed posture, dowager's hump  UPPER EXTREMITY ROM:   P/A ROM Right eval Left eval AA/Act on finger ladder  Standing 02/20/23 RT  Shoulder flexion 50/105  130 / 115  Shoulder extension     Shoulder abduction 46/58  120/ 95  Shoulder adduction     Shoulder internal rotation WNL    Shoulder external rotation 30/45    Elbow flexion     Elbow extension     Wrist flexion     Wrist extension     Wrist ulnar deviation     Wrist radial deviation     Wrist pronation     Wrist supination      (Blank rows = not tested)  UPPER EXTREMITY MMT: 3-/5 R shoulder. Elbow, wrist at least 3+/5   JOINT MOBILITY TESTING:  G-H deferred, scapula ROM limited, stiff  PALPATION:  TTP over supraspinatus, pects with tihgtness in pects and cervical paraspinals.   TODAY'S TREATMENT:                                                                                                                                         DATE:   02/25/23 UBE L 3 each way Red tband shld ext,row and ER 2 sets 10 Red tband bicep curl 2 sets 10 cabinet taps 2 shelves 1# wt 10 x flex and abd 10 x each 2# waTe bar standing AA ex 12 x each STM to pects and UT on R with caudal distraction, scapular mobs, and P stretch into flex, abd, ER VASO x 10 minutes to R shoulder, med pressure, for pain and inflammation     02/20/23 UBE L 2 3 min each way Finger ladder x 10 in  flex and abd RT UE  AAROM with dowel for flex, abd, ext, IR/ER x 10 each Cabinet taps in flex to second shelf and abd to 2nd shelf x 10 each Yellow tband shld ext,row and ER 2 sets 10 STM to pects and UT on R with caudal distraction, scapular mobs, and P stretch into flex, abd, ER VASO x 10 minutes to R shoulder, med pressure, for pain and inflammation   02/17/23 UBE L1 x 3 min for and back Finger ladder x 10 in flex and abd AAROM with dowel for flex, abd, ext, IR/ER x 10 each Cabinet taps in flex to second shelf and abd to first shelf x 10 each Ball on wall serratus press, circles x 10 in each direction, VC and TC to slow down and maintain pressure. STM to pects and UT on R with caudal distraction, scapular mobs, and P stretch into flex, abd, ER VASO x 10 minutes to R shoulder, med pressure, for pain and inflammation  02/13/23 PROM RT shld all directions Gentle joint mobs Gentle STW to shld and upper arm Finger ladder flex and abd 8 x each Ball rolling on mat AA cane ex standing 10 x each Yellow tband shld ext and row 10 x each VASO RT  shld after for soreness and inflammation  02/10/23 Education Table slides into flex and abd, scap retraction, elbow, wrist, forearm exercises-active  PATIENT EDUCATION: Education details: POC Person educated: Patient Education method: Explanation Education comprehension: verbalized understanding  HOME EXERCISE PROGRAM: [id number]  ASSESSMENT:  CLINICAL IMPRESSION: progressing P/AA and AROM.ROM measurements taken. Pt is making good steady progress OBJECTIVE IMPAIRMENTS: decreased activity tolerance, decreased balance, decreased coordination, decreased ROM, decreased strength, impaired flexibility, impaired UE functional use, improper body mechanics, postural dysfunction, and pain.   ACTIVITY LIMITATIONS: carrying, lifting, sleeping, and reach over head  PARTICIPATION LIMITATIONS: meal prep, cleaning, laundry, driving, shopping, and community activity  PERSONAL FACTORS: Past/current experiences are also affecting patient's functional outcome.   REHAB POTENTIAL: Good  CLINICAL DECISION MAKING: Evolving/moderate complexity  EVALUATION COMPLEXITY: Moderate   GOALS: Goals reviewed with patient? Yes  SHORT TERM GOALS: Target date: 02/28/23  I with initial HEP Baseline: Goal status: 02/13/23 MET  LONG TERM GOALS: Target date: 05/05/23  I with final HEP Baseline:  Goal status: INITIAL  2.  Patient will recover at least 120 degrees of active R shoulder flexion and abduction Baseline: See ROM Goal status: 02/20/23 progressing  3.  Increase RUE shoulder strength to at least 4/5 Baseline: 3-/5 Goal status: INITIAL  4.  Patient will report the ability to perform all of her normal daily activities with RU. Baseline: Severely limited functionally Goal status: INITIAL  5.  Decrease R shoulder pain to < 2/10 with all activity. Baseline: 4/10 Goal status: INITIAL  PLAN:  PT FREQUENCY: 1-2x/week  PT DURATION: 12 weeks  PLANNED INTERVENTIONS: Therapeutic exercises,  Therapeutic activity, Neuromuscular re-education, Balance training, Gait training, Patient/Family education, Self Care, Joint mobilization, Dry Needling, Electrical stimulation, Cryotherapy, Moist heat, Vasopneumatic device, Ionotophoresis 4mg /ml Dexamethasone, and Manual therapy  PLAN FOR NEXT SESSION: AAROM, scapular stabilizing exercises.   Patient Details  Name: [name] MRN: [id number] Date of Birth: [birth year] Referring Provider:  Teryl Lucy, MD  Encounter Date: 02/25/2023   Suanne Marker, PTA 02/25/2023, 11:29 AM  [facility] [address] [place], [place], [address] Phone: [phone]   Fax:  [phone] [address]  [address]. [place], [place], [address] Phone: [phone]   Fax:  [phone]

## 2023-02-27 ENCOUNTER — Encounter: Payer: Self-pay | Admitting: Physical Therapy

## 2023-02-27 ENCOUNTER — Ambulatory Visit: Payer: Medicare PPO | Admitting: Physical Therapy

## 2023-02-27 DIAGNOSIS — M6281 Muscle weakness (generalized): Secondary | ICD-10-CM | POA: Diagnosis not present

## 2023-02-27 DIAGNOSIS — G8918 Other acute postprocedural pain: Secondary | ICD-10-CM | POA: Diagnosis not present

## 2023-02-27 DIAGNOSIS — R600 Localized edema: Secondary | ICD-10-CM

## 2023-02-27 DIAGNOSIS — M25611 Stiffness of right shoulder, not elsewhere classified: Secondary | ICD-10-CM

## 2023-02-27 DIAGNOSIS — M25511 Pain in right shoulder: Secondary | ICD-10-CM | POA: Diagnosis not present

## 2023-02-27 DIAGNOSIS — Z9889 Other specified postprocedural states: Secondary | ICD-10-CM | POA: Diagnosis not present

## 2023-02-27 DIAGNOSIS — M25811 Other specified joint disorders, right shoulder: Secondary | ICD-10-CM

## 2023-02-27 NOTE — Therapy (Addendum)
OUTPATIENT PHYSICAL THERAPY SHOULDER    Patient Name: [name] MRN: [id number] DOB:04-28-1951, 72 y.o., female Today's Date: 02/27/2023  END OF SESSION:  PT End of Session - 02/27/23 1449     Visit Number 6    Date for PT Re-Evaluation 05/05/23    PT Start Time 1445    PT Stop Time 1525    PT Time Calculation (min) 40 min    Activity Tolerance Patient limited by pain    Behavior During Therapy [facility] for tasks assessed/performed               Past Medical History:  Diagnosis Date   Allergy    Anxiety    Arthritis    back & knee   Asthma     mild per pt shows up with resp illness   Colon polyps    Constipation    Diabetes mellitus without complication (HCC)    Gallstones    Gastric polyps    Gastroparesis    GERD (gastroesophageal reflux disease)    Headache    sinus headaches and migraines at times   Heart murmur    History of migraine headaches    HTN (hypertension)    Hyperlipidemia    IBS (irritable bowel syndrome)    Joint pain    Lumbar disc disease    PONV (postoperative nausea and vomiting)    S/P aortic valve replacement with bioprosthetic valve 08/23/2016   25 mm Edwards Intuity rapid-deployment bovine pericardial tissue valve via partial upper mini sternotomy   Sleep apnea    does not use CPAP   TIA (transient ischemic attack)    TIA (transient ischemic attack)    hx of per pt   Past Surgical History:  Procedure Laterality Date   ABDOMINAL HYSTERECTOMY     AORTIC VALVE REPLACEMENT N/A 08/23/2016   Procedure: AORTIC VALVE REPLACEMENT (AVR) - using partial Upper Sternotomy- 25mm Edwards Intuity Aortic Valve used;  Surgeon: Purcell Nails, MD;  Location: [facility] OR;  Service: Open Heart Surgery;  Laterality: N/A;   BLADDER SUSPENSION  2007   BREAST ENHANCEMENT SURGERY  1980   CARPAL TUNNEL RELEASE Bilateral    COLONOSCOPY     DORSAL COMPARTMENT RELEASE Right 09/15/2014   Procedure: RIGHT WRIST DEQUERVAINS RELEASE ;  Surgeon: Mckinley Jewel,  MD;  Location: [facility];  Service: Orthopedics;  Laterality: Right;   DRUG INDUCED ENDOSCOPY N/A 02/19/2022   Procedure: DRUG INDUCED SLEEP ENDOSCOPY;  Surgeon: Osborn Coho, MD;  Location: [facility];  Service: ENT;  Laterality: N/A;   ESOPHAGOGASTRODUODENOSCOPY     EYE SURGERY     cataract surgery   IMPLANTATION OF HYPOGLOSSAL NERVE STIMULATOR Right 04/23/2022   Procedure: IMPLANTATION OF HYPOGLOSSAL NERVE STIMULATOR;  Surgeon: Osborn Coho, MD;  Location: [facility];  Service: ENT;  Laterality: Right;   KNEE ARTHROSCOPY  04/15/2012   Procedure: ARTHROSCOPY KNEE;  Surgeon: Loreta Ave, MD;  Location: [facility] OR;  Service: Orthopedics;  Laterality: Right;   LAPAROSCOPIC CHOLECYSTECTOMY     LASIK     OOPHORECTOMY     PALATE TO GINGIVA GRAFT  2017   REVERSE SHOULDER ARTHROPLASTY Left 02/13/2021   Procedure: REVERSE SHOULDER ARTHROPLASTY;  Surgeon: Teryl Lucy, MD;  Location: [facility];  Service: Orthopedics;  Laterality: Left;   RIGHT/LEFT HEART CATH AND CORONARY ANGIOGRAPHY N/A 08/02/2016   Procedure: Right/Left Heart Cath and Coronary Angiography;  Surgeon: Tonny Bollman, MD;  Location: [facility] INVASIVE CV LAB;  Service: Cardiovascular;  Laterality: N/A;   ROTATOR CUFF REPAIR Left    SHOULDER ARTHROSCOPY WITH ROTATOR CUFF REPAIR AND SUBACROMIAL DECOMPRESSION Right 12/24/2022   Procedure: SHOULDER ARTHROSCOPY WITH DEBRIDEMENT, ROTATOR CUFF REPAIR AND SUBACROMIAL DECOMPRESSION;  Surgeon: Teryl Lucy, MD;  Location: [facility];  Service: Orthopedics;  Laterality: Right;   TEE WITHOUT CARDIOVERSION N/A 08/23/2016   Procedure: TRANSESOPHAGEAL ECHOCARDIOGRAM (TEE);  Surgeon: Purcell Nails, MD;  Location: [facility] OR;  Service: Open Heart Surgery;  Laterality: N/A;   TOTAL KNEE ARTHROPLASTY  04/15/2012   Procedure: TOTAL KNEE ARTHROPLASTY;  Surgeon: Loreta Ave, MD;  Location: [facility] OR;  Service: Orthopedics;  Laterality: Right;   TRIGGER  FINGER RELEASE Right 04/20/2015   Procedure: RIGHT TRIGGER FINGER RELEASE (TENDON SHEALTH INCISION) ;  Surgeon: Loreta Ave, MD;  Location: [facility];  Service: Orthopedics;  Laterality: Right;   Patient Active Problem List   Diagnosis Date Noted   Class 2 severe obesity with serious comorbidity and body mass index (BMI) of 36.0 to 36.9 in adult (HCC) 05/09/2022   Type 2 diabetes mellitus with obesity (HCC) 05/09/2022   Hypertension associated with type 2 diabetes mellitus (HCC) 05/09/2022   SOB (shortness of breath) 12/10/2021   S/P reverse total shoulder arthroplasty, left 02/13/2021   Idiopathic hyperphosphatasia 08/03/2019   Vitamin D deficiency 06/04/2018   Hyponatremia 02/17/2018   History of CVA in adulthood 08/13/2017   Blurry vision 08/13/2017   Primary hypertension 01/02/2017   S/P minimally invasive aortic valve replacement with bioprosthetic valve 08/23/2016   Diastolic dysfunction    Osteopenia 05/27/2016   OSA (obstructive sleep apnea) 02/27/2016   Bruxism 10/23/2015   Elevated alkaline phosphatase level 05/16/2015   Atypical chest pain 11/10/2014   Acne 05/12/2014   Diabetes mellitus (HCC) 05/12/2014   GERD (gastroesophageal reflux disease) 03/10/2014   Generalized anxiety disorder 03/10/2014   Migraines 03/10/2014   EUSTACHIAN TUBE DYSFUNCTION, CHRONIC 06/14/2010   Irritable bowel syndrome 12/15/2008   Low back pain 08/11/2008   Hyperlipidemia associated with type 2 diabetes mellitus (HCC) 02/09/2008   Allergic rhinitis 02/12/2007   Asthma 02/12/2007    PCP: Shelva Majestic, MD  REFERRING PROVIDER: Teryl Lucy, MD  REFERRING DIAG: Rt Shoulder Arthroscopy 12/24/22   THERAPY DIAG:  Stiffness of right shoulder, not elsewhere classified  Muscle weakness (generalized)  Localized edema  Acute postoperative pain of right shoulder  History of arthroscopy of right shoulder  Impingement of right shoulder  Rationale for Evaluation and  Treatment: Rehabilitation  ONSET DATE: 12/24/22  SUBJECTIVE:  SUBJECTIVE STATEMENT: Patient reports some soreness as she increases her functional use.   Hand dominance: Right  PERTINENT HISTORY: Per order: S/P R shoulder arthroscopy with RC repair, distal clavicle excision and acromioplasty 12/24/22 Supraspinatus complete tear, biceps tendon with significant tearing Surgical: PROCEDURE: Right shoulder arthroscopy with extensive debridement of biceps tendon, superior labrum, subdeltoid bursa, with acromioplasty, distal clavicle resection, and supraspinatus repair    PAIN:  Are you having pain? Yes: NPRS scale: 4/10 Pain location: R shoulder Pain description: aching Aggravating factors: Has trouble getting comfortable at night. Relieving factors: Tramadol at  night, Tylenol in am  PRECAUTIONS: Shoulder  RED FLAGS: None   WEIGHT BEARING RESTRICTIONS: Patient reports none given  FALLS:  Has patient fallen in last 6 months? No  LIVING ENVIRONMENT: Lives with: lives with their family and lives with their spouse Lives in: House/apartment Stairs: Yes: External: 5 steps; on left going up Has following equipment at home: None  OCCUPATION: Retired  PLOF: Independent  PATIENT GOALS:Patient would like to regain her motion and strength in the R arm, be able to sleep  NEXT MD VISIT:   OBJECTIVE:   DIAGNOSTIC FINDINGS:  N/A  COGNITION: Overall cognitive status: Within functional limits for tasks assessed     SENSATION: WFL  POSTURE: Round shoulders, flexed posture, dowager's hump  UPPER EXTREMITY ROM:   P/A ROM Right eval Left eval AA/Act on finger ladder  Standing 02/20/23 RT  Shoulder flexion 50/105  130 / 115  Shoulder extension     Shoulder abduction 46/58  120/ 95  Shoulder adduction      Shoulder internal rotation WNL    Shoulder external rotation 30/45    Elbow flexion     Elbow extension     Wrist flexion     Wrist extension     Wrist ulnar deviation     Wrist radial deviation     Wrist pronation     Wrist supination     (Blank rows = not tested)  UPPER EXTREMITY MMT: 3-/5 R shoulder. Elbow, wrist at least 3+/5  JOINT MOBILITY TESTING:  G-H deferred, scapula ROM limited, stiff  PALPATION:  TTP over supraspinatus, pects with tihgtness in pects and cervical paraspinals.   TODAY'S TREATMENT:                                                                                                                                         DATE:  02/27/23 UBE L3 3 min forward and back Wall slides flex, scaption, abd x 10 each. Shoulder IR and ER against Red Tband, 2 x 10 STM to UT, pects, lats on R, scapular mobs, GH joint mobs, Grade III for inf and post glide, PROM into flex, abd, ER Bent over row, horizontal shoulder abd, shoulder ext, with 2# weight 2 x 10 reps VASO to R shoulder x 10 min, med pressure for pain and inflammation  02/25/23 UBE L 3 each way Red tband shld ext,row and ER 2 sets 10 Red tband bicep curl 2 sets 10 cabinet taps 2 shelves 1# wt 10 x flex and abd 10 x each 2# waTe bar standing AA ex 12 x each STM to pects and UT on R with caudal distraction, scapular mobs, and P stretch into flex, abd, ER VASO x 10 minutes to R shoulder, med pressure, for pain and inflammation  02/20/23 UBE L 2 3 min each way Finger ladder x 10 in flex and abd RT UE  AAROM with dowel for flex, abd, ext, IR/ER x 10 each Cabinet taps in flex to second shelf and abd to 2nd shelf x 10 each Yellow tband shld ext,row and ER 2 sets 10 STM to pects and UT on R with caudal distraction, scapular mobs, and P stretch into flex, abd, ER VASO x 10 minutes to R shoulder, med pressure, for pain and inflammation  02/17/23 UBE L1 x 3 min for and back Finger ladder x 10 in flex and  abd AAROM with dowel for flex, abd, ext, IR/ER x 10 each Cabinet taps in flex to second shelf and abd to first shelf x 10 each Ball on wall serratus press, circles x 10 in each direction, VC and TC to slow down and maintain pressure. STM to pects and UT on R with caudal distraction, scapular mobs, and P stretch into flex, abd, ER VASO x 10 minutes to R shoulder, med pressure, for pain and inflammation  02/13/23 PROM RT shld all directions Gentle joint mobs Gentle STW to shld and upper arm Finger ladder flex and abd 8 x each Ball rolling on mat AA cane ex standing 10 x each Yellow tband shld ext and row 10 x each VASO RT shld after for soreness and inflammation  02/10/23 Education Table slides into flex and abd, scap retraction, elbow, wrist, forearm exercises-active  PATIENT EDUCATION: Education details: POC Person educated: Patient Education method: Explanation Education comprehension: verbalized understanding  HOME EXERCISE PROGRAM: [id number]  ASSESSMENT:  CLINICAL IMPRESSION: Patient reports continued progress with strength and ROM of R shoulder. Tolerated progression of exercises today with no reports of pain.  OBJECTIVE IMPAIRMENTS: decreased activity tolerance, decreased balance, decreased coordination, decreased ROM, decreased strength, impaired flexibility, impaired UE functional use, improper body mechanics, postural dysfunction, and pain.   ACTIVITY LIMITATIONS: carrying, lifting, sleeping, and reach over head  PARTICIPATION LIMITATIONS: meal prep, cleaning, laundry, driving, shopping, and community activity  PERSONAL FACTORS: Past/current experiences are also affecting patient's functional outcome.   REHAB POTENTIAL: Good  CLINICAL DECISION MAKING: Evolving/moderate complexity  EVALUATION COMPLEXITY: Moderate   GOALS: Goals reviewed with patient? Yes  SHORT TERM GOALS: Target date: 02/28/23  I with initial HEP Baseline: Goal status: 02/13/23 MET  LONG  TERM GOALS: Target date: 05/05/23  I with final HEP Baseline:  Goal status: INITIAL  2.  Patient will recover at least 120 degrees of active R shoulder flexion and abduction Baseline: See ROM Goal status: 02/20/23 progressing  3.  Increase RUE shoulder strength to at least 4/5 Baseline: 3-/5 Goal status: INITIAL  4.  Patient will report the ability to perform all of her normal daily activities with RU. Baseline: Severely limited functionally Goal status: INITIAL  5.  Decrease R shoulder pain to < 2/10 with all activity. Baseline: 4/10 Goal status: INITIAL  PLAN:  PT FREQUENCY: 1-2x/week  PT DURATION: 12 weeks  PLANNED INTERVENTIONS: Therapeutic exercises, Therapeutic activity, Neuromuscular re-education, Balance  training, Gait training, Patient/Family education, Self Care, Joint mobilization, Dry Needling, Electrical stimulation, Cryotherapy, Moist heat, Vasopneumatic device, Ionotophoresis 4mg /ml Dexamethasone, and Manual therapy  PLAN FOR NEXT SESSION: AAROM, scapular stabilizing exercises.   Patient Details  Name: LAMAYA CORLETT MRN: [id number] Date of Birth: [birth year] Referring Provider:  Teryl Lucy, MD  Encounter Date: 02/27/2023   Iona Beard, PT 02/27/2023, 3:39 PM  [facility] [address] [place], [place], [address] Phone: [phone]   Fax:  [phone]

## 2023-03-04 ENCOUNTER — Ambulatory Visit: Payer: Medicare PPO | Admitting: Physical Therapy

## 2023-03-04 DIAGNOSIS — Z9889 Other specified postprocedural states: Secondary | ICD-10-CM | POA: Diagnosis not present

## 2023-03-04 DIAGNOSIS — G8918 Other acute postprocedural pain: Secondary | ICD-10-CM | POA: Diagnosis not present

## 2023-03-04 DIAGNOSIS — M25611 Stiffness of right shoulder, not elsewhere classified: Secondary | ICD-10-CM | POA: Diagnosis not present

## 2023-03-04 DIAGNOSIS — M6281 Muscle weakness (generalized): Secondary | ICD-10-CM | POA: Diagnosis not present

## 2023-03-04 DIAGNOSIS — R600 Localized edema: Secondary | ICD-10-CM

## 2023-03-04 DIAGNOSIS — M25511 Pain in right shoulder: Secondary | ICD-10-CM | POA: Diagnosis not present

## 2023-03-04 DIAGNOSIS — M25811 Other specified joint disorders, right shoulder: Secondary | ICD-10-CM | POA: Diagnosis not present

## 2023-03-04 NOTE — Therapy (Signed)
OUTPATIENT PHYSICAL THERAPY SHOULDER    Patient Name: [name] MRN: [id number] DOB:1950/07/12, 72 y.o., female Today's Date: 03/04/2023  END OF SESSION:  PT End of Session - 03/04/23 1301     Visit Number 7    Date for PT Re-Evaluation 05/05/23    PT Start Time 1315    PT Stop Time 1400    PT Time Calculation (min) 45 min               Past Medical History:  Diagnosis Date   Allergy    Anxiety    Arthritis    back & knee   Asthma     mild per pt shows up with resp illness   Colon polyps    Constipation    Diabetes mellitus without complication (HCC)    Gallstones    Gastric polyps    Gastroparesis    GERD (gastroesophageal reflux disease)    Headache    sinus headaches and migraines at times   Heart murmur    History of migraine headaches    HTN (hypertension)    Hyperlipidemia    IBS (irritable bowel syndrome)    Joint pain    Lumbar disc disease    PONV (postoperative nausea and vomiting)    S/P aortic valve replacement with bioprosthetic valve 08/23/2016   25 mm Edwards Intuity rapid-deployment bovine pericardial tissue valve via partial upper mini sternotomy   Sleep apnea    does not use CPAP   TIA (transient ischemic attack)    TIA (transient ischemic attack)    hx of per pt   Past Surgical History:  Procedure Laterality Date   ABDOMINAL HYSTERECTOMY     AORTIC VALVE REPLACEMENT N/A 08/23/2016   Procedure: AORTIC VALVE REPLACEMENT (AVR) - using partial Upper Sternotomy- 25mm Edwards Intuity Aortic Valve used;  Surgeon: Purcell Nails, MD;  Location: [facility] OR;  Service: Open Heart Surgery;  Laterality: N/A;   BLADDER SUSPENSION  2007   BREAST ENHANCEMENT SURGERY  1980   CARPAL TUNNEL RELEASE Bilateral    COLONOSCOPY     DORSAL COMPARTMENT RELEASE Right 09/15/2014   Procedure: RIGHT WRIST DEQUERVAINS RELEASE ;  Surgeon: Mckinley Jewel, MD;  Location: [facility];  Service: Orthopedics;  Laterality: Right;   DRUG INDUCED  ENDOSCOPY N/A 02/19/2022   Procedure: DRUG INDUCED SLEEP ENDOSCOPY;  Surgeon: Osborn Coho, MD;  Location: [facility];  Service: ENT;  Laterality: N/A;   ESOPHAGOGASTRODUODENOSCOPY     EYE SURGERY     cataract surgery   IMPLANTATION OF HYPOGLOSSAL NERVE STIMULATOR Right 04/23/2022   Procedure: IMPLANTATION OF HYPOGLOSSAL NERVE STIMULATOR;  Surgeon: Osborn Coho, MD;  Location: [facility];  Service: ENT;  Laterality: Right;   KNEE ARTHROSCOPY  04/15/2012   Procedure: ARTHROSCOPY KNEE;  Surgeon: Loreta Ave, MD;  Location: [facility] OR;  Service: Orthopedics;  Laterality: Right;   LAPAROSCOPIC CHOLECYSTECTOMY     LASIK     OOPHORECTOMY     PALATE TO GINGIVA GRAFT  2017   REVERSE SHOULDER ARTHROPLASTY Left 02/13/2021   Procedure: REVERSE SHOULDER ARTHROPLASTY;  Surgeon: Teryl Lucy, MD;  Location: [facility];  Service: Orthopedics;  Laterality: Left;   RIGHT/LEFT HEART CATH AND CORONARY ANGIOGRAPHY N/A 08/02/2016   Procedure: Right/Left Heart Cath and Coronary Angiography;  Surgeon: Tonny Bollman, MD;  Location: [facility] INVASIVE CV [facility];  Service: Cardiovascular;  Laterality: N/A;   ROTATOR CUFF REPAIR Left    SHOULDER ARTHROSCOPY WITH ROTATOR  CUFF REPAIR AND SUBACROMIAL DECOMPRESSION Right 12/24/2022   Procedure: SHOULDER ARTHROSCOPY WITH DEBRIDEMENT, ROTATOR CUFF REPAIR AND SUBACROMIAL DECOMPRESSION;  Surgeon: Teryl Lucy, MD;  Location: [facility];  Service: Orthopedics;  Laterality: Right;   TEE WITHOUT CARDIOVERSION N/A 08/23/2016   Procedure: TRANSESOPHAGEAL ECHOCARDIOGRAM (TEE);  Surgeon: Purcell Nails, MD;  Location: [facility];  Service: Open Heart Surgery;  Laterality: N/A;   TOTAL KNEE ARTHROPLASTY  04/15/2012   Procedure: TOTAL KNEE ARTHROPLASTY;  Surgeon: Loreta Ave, MD;  Location: [facility] OR;  Service: Orthopedics;  Laterality: Right;   TRIGGER FINGER RELEASE Right 04/20/2015   Procedure: RIGHT TRIGGER FINGER RELEASE (TENDON SHEALTH INCISION) ;   Surgeon: Loreta Ave, MD;  Location: [facility];  Service: Orthopedics;  Laterality: Right;   Patient Active Problem List   Diagnosis Date Noted   Class 2 severe obesity with serious comorbidity and body mass index (BMI) of 36.0 to 36.9 in adult [facility]) 05/09/2022   Type 2 diabetes mellitus with obesity (HCC) 05/09/2022   Hypertension associated with type 2 diabetes mellitus (HCC) 05/09/2022   SOB (shortness of breath) 12/10/2021   S/P reverse total shoulder arthroplasty, left 02/13/2021   Idiopathic hyperphosphatasia 08/03/2019   Vitamin D deficiency 06/04/2018   Hyponatremia 02/17/2018   History of CVA in adulthood 08/13/2017   Blurry vision 08/13/2017   Primary hypertension 01/02/2017   S/P minimally invasive aortic valve replacement with bioprosthetic valve 08/23/2016   Diastolic dysfunction    Osteopenia 05/27/2016   OSA (obstructive sleep apnea) 02/27/2016   Bruxism 10/23/2015   Elevated alkaline phosphatase level 05/16/2015   Atypical chest pain 11/10/2014   Acne 05/12/2014   Diabetes mellitus (HCC) 05/12/2014   GERD (gastroesophageal reflux disease) 03/10/2014   Generalized anxiety disorder 03/10/2014   Migraines 03/10/2014   EUSTACHIAN TUBE DYSFUNCTION, CHRONIC 06/14/2010   Irritable bowel syndrome 12/15/2008   Low back pain 08/11/2008   Hyperlipidemia associated with type 2 diabetes mellitus (HCC) 02/09/2008   Allergic rhinitis 02/12/2007   Asthma 02/12/2007    PCP: Shelva Majestic, MD  REFERRING PROVIDER: Teryl Lucy, MD  REFERRING DIAG: Rt Shoulder Arthroscopy 12/24/22   THERAPY DIAG:  Stiffness of right shoulder, not elsewhere classified  Muscle weakness (generalized)  Localized edema  Rationale for Evaluation and Treatment: Rehabilitation  ONSET DATE: 12/24/22  SUBJECTIVE:                                                                                                                                                                                       SUBJECTIVE STATEMENT: saw surgeon yesterday and he was very pleased 9 weeks post op.  Hand dominance:  Right  PERTINENT HISTORY: Per order: S/P R shoulder arthroscopy with RC repair, distal clavicle excision and acromioplasty 12/24/22 Supraspinatus complete tear, biceps tendon with significant tearing Surgical: PROCEDURE: Right shoulder arthroscopy with extensive debridement of biceps tendon, superior labrum, subdeltoid bursa, with acromioplasty, distal clavicle resection, and supraspinatus repair    PAIN:  Are you having pain? Yes: NPRS scale: 3/10 Pain location: R shoulder Pain description: aching Aggravating factors: Has trouble getting comfortable at night. Relieving factors: Tramadol at  night, Tylenol in am  PRECAUTIONS: Shoulder  RED FLAGS: None   WEIGHT BEARING RESTRICTIONS: Patient reports none given  FALLS:  Has patient fallen in last 6 months? No  LIVING ENVIRONMENT: Lives with: lives with their family and lives with their spouse Lives in: House/apartment Stairs: Yes: External: 5 steps; on left going up Has following equipment at home: None  OCCUPATION: Retired  PLOF: Independent  PATIENT GOALS:Patient would like to regain her motion and strength in the R arm, be able to sleep  NEXT MD VISIT:   OBJECTIVE:   DIAGNOSTIC FINDINGS:  N/A  COGNITION: Overall cognitive status: Within functional limits for tasks assessed     SENSATION: WFL  POSTURE: Round shoulders, flexed posture, dowager's hump  UPPER EXTREMITY ROM:   P/A ROM Right eval Left eval AA/Act on finger ladder  Standing 02/20/23 RT  Shoulder flexion 50/105  130 / 115  Shoulder extension     Shoulder abduction 46/58  120/ 95  Shoulder adduction     Shoulder internal rotation WNL    Shoulder external rotation 30/45    Elbow flexion     Elbow extension     Wrist flexion     Wrist extension     Wrist ulnar deviation     Wrist radial deviation     Wrist pronation      Wrist supination     (Blank rows = not tested)  UPPER EXTREMITY MMT: 3-/5 R shoulder. Elbow, wrist at least 3+/5  JOINT MOBILITY TESTING:  G-H deferred, scapula ROM limited, stiff  PALPATION:  TTP over supraspinatus, pects with tihgtness in pects and cervical paraspinals.   TODAY'S TREATMENT:                                                                                                                                         DATE:   03/04/23 UBE L3 3 min forward and back Seated rows and Lats 15# 2 sets 10 Chest press with serratus 5# 2 sets 10 2# standing cane ex 10 x Bent over row, horizontal shoulder abd, shoulder ext, with 2# weight 2 x 10 reps STM to UT, pects, lats on R, scapular mobs, GH joint mobs, Grade III for inf and post glide, PROM into flex, abd, ER VASO to R shoulder x 10 min, med pressure for pain and inflammation    02/27/23 UBE L3 3 min forward and back  Wall slides flex, scaption, abd x 10 each. Shoulder IR and ER against Red Tband, 2 x 10 STM to UT, pects, lats on R, scapular mobs, GH joint mobs, Grade III for inf and post glide, PROM into flex, abd, ER Bent over row, horizontal shoulder abd, shoulder ext, with 2# weight 2 x 10 reps VASO to R shoulder x 10 min, med pressure for pain and inflammation  02/25/23 UBE L 3 each way Red tband shld ext,row and ER 2 sets 10 Red tband bicep curl 2 sets 10 cabinet taps 2 shelves 1# wt 10 x flex and abd 10 x each 2# waTe bar standing AA ex 12 x each STM to pects and UT on R with caudal distraction, scapular mobs, and P stretch into flex, abd, ER VASO x 10 minutes to R shoulder, med pressure, for pain and inflammation  02/20/23 UBE L 2 3 min each way Finger ladder x 10 in flex and abd RT UE  AAROM with dowel for flex, abd, ext, IR/ER x 10 each Cabinet taps in flex to second shelf and abd to 2nd shelf x 10 each Yellow tband shld ext,row and ER 2 sets 10 STM to pects and UT on R with caudal distraction, scapular  mobs, and P stretch into flex, abd, ER VASO x 10 minutes to R shoulder, med pressure, for pain and inflammation  02/17/23 UBE L1 x 3 min for and back Finger ladder x 10 in flex and abd AAROM with dowel for flex, abd, ext, IR/ER x 10 each Cabinet taps in flex to second shelf and abd to first shelf x 10 each Ball on wall serratus press, circles x 10 in each direction, VC and TC to slow down and maintain pressure. STM to pects and UT on R with caudal distraction, scapular mobs, and P stretch into flex, abd, ER VASO x 10 minutes to R shoulder, med pressure, for pain and inflammation  02/13/23 PROM RT shld all directions Gentle joint mobs Gentle STW to shld and upper arm Finger ladder flex and abd 8 x each Ball rolling on mat AA cane ex standing 10 x each Yellow tband shld ext and row 10 x each VASO RT shld after for soreness and inflammation  02/10/23 Education Table slides into flex and abd, scap retraction, elbow, wrist, forearm exercises-active  PATIENT EDUCATION: Education details: POC Person educated: Patient Education method: Explanation Education comprehension: verbalized understanding  HOME EXERCISE PROGRAM: [id number]  ASSESSMENT:  CLINICAL IMPRESSION: Pt arrives stating saw surgeon yesterday and he was very pleased. 9 Weeks post op..Patient reports continued progress with strength and ROM of R shoulder. Tolerated progression of exercises today with no reports of pain.  OBJECTIVE IMPAIRMENTS: decreased activity tolerance, decreased balance, decreased coordination, decreased ROM, decreased strength, impaired flexibility, impaired UE functional use, improper body mechanics, postural dysfunction, and pain.   ACTIVITY LIMITATIONS: carrying, lifting, sleeping, and reach over head  PARTICIPATION LIMITATIONS: meal prep, cleaning, laundry, driving, shopping, and community activity  PERSONAL FACTORS: Past/current experiences are also affecting patient's functional outcome.    REHAB POTENTIAL: Good  CLINICAL DECISION MAKING: Evolving/moderate complexity  EVALUATION COMPLEXITY: Moderate   GOALS: Goals reviewed with patient? Yes  SHORT TERM GOALS: Target date: 02/28/23  I with initial HEP Baseline: Goal status: 02/13/23 MET  LONG TERM GOALS: Target date: 05/05/23  I with final HEP Baseline:  Goal status: INITIAL  2.  Patient will recover at least 120 degrees of active R shoulder flexion and abduction Baseline: See  ROM Goal status: 02/20/23 progressing  3.  Increase RUE shoulder strength to at least 4/5 Baseline: 3-/5 Goal status: INITIAL  4.  Patient will report the ability to perform all of her normal daily activities with RU. Baseline: Severely limited functionally Goal status: INITIAL  5.  Decrease R shoulder pain to < 2/10 with all activity. Baseline: 4/10 Goal status: INITIAL  PLAN:  PT FREQUENCY: 1-2x/week  PT DURATION: 12 weeks  PLANNED INTERVENTIONS: Therapeutic exercises, Therapeutic activity, Neuromuscular re-education, Balance training, Gait training, Patient/Family education, Self Care, Joint mobilization, Dry Needling, Electrical stimulation, Cryotherapy, Moist heat, Vasopneumatic device, Ionotophoresis 4mg /ml Dexamethasone, and Manual therapy  PLAN FOR NEXT SESSION: Check ROM and goals   Patient Details  Name: [name] MRN: [id number] Date of Birth: 10-17-1950 Referring Provider:  Teryl Lucy, MD  Encounter Date: 03/04/2023   Suanne Marker, PTA 03/04/2023, 1:23 PM  [facility] [address] [facility] [place], [place], [address] Phone: [phone]   Fax:  [phone] [address] [facility] [address] [place], [place], [address] Phone: [phone]   Fax:  [phone]

## 2023-03-06 ENCOUNTER — Encounter: Payer: Self-pay | Admitting: Physical Therapy

## 2023-03-06 ENCOUNTER — Ambulatory Visit: Payer: Medicare PPO | Admitting: Physical Therapy

## 2023-03-06 DIAGNOSIS — Z9889 Other specified postprocedural states: Secondary | ICD-10-CM | POA: Diagnosis not present

## 2023-03-06 DIAGNOSIS — M6281 Muscle weakness (generalized): Secondary | ICD-10-CM

## 2023-03-06 DIAGNOSIS — R600 Localized edema: Secondary | ICD-10-CM

## 2023-03-06 DIAGNOSIS — M25611 Stiffness of right shoulder, not elsewhere classified: Secondary | ICD-10-CM

## 2023-03-06 DIAGNOSIS — G8918 Other acute postprocedural pain: Secondary | ICD-10-CM

## 2023-03-06 DIAGNOSIS — M25511 Pain in right shoulder: Secondary | ICD-10-CM | POA: Diagnosis not present

## 2023-03-06 DIAGNOSIS — M25811 Other specified joint disorders, right shoulder: Secondary | ICD-10-CM | POA: Diagnosis not present

## 2023-03-06 NOTE — Therapy (Signed)
OUTPATIENT PHYSICAL THERAPY SHOULDER    Patient Name: [name] MRN: [id number] DOB:1951/01/18, 72 y.o., female Today's Date: 03/06/2023  END OF SESSION:  PT End of Session - 03/06/23 1528     Visit Number 8    Date for PT Re-Evaluation 05/05/23    PT Start Time 1527    PT Stop Time 1610    PT Time Calculation (min) 43 min    Activity Tolerance Patient limited by pain    Behavior During Therapy [facility] for tasks assessed/performed                Past Medical History:  Diagnosis Date   Allergy    Anxiety    Arthritis    back & knee   Asthma     mild per pt shows up with resp illness   Colon polyps    Constipation    Diabetes mellitus without complication (HCC)    Gallstones    Gastric polyps    Gastroparesis    GERD (gastroesophageal reflux disease)    Headache    sinus headaches and migraines at times   Heart murmur    History of migraine headaches    HTN (hypertension)    Hyperlipidemia    IBS (irritable bowel syndrome)    Joint pain    Lumbar disc disease    PONV (postoperative nausea and vomiting)    S/P aortic valve replacement with bioprosthetic valve 08/23/2016   25 mm Edwards Intuity rapid-deployment bovine pericardial tissue valve via partial upper mini sternotomy   Sleep apnea    does not use CPAP   TIA (transient ischemic attack)    TIA (transient ischemic attack)    hx of per pt   Past Surgical History:  Procedure Laterality Date   ABDOMINAL HYSTERECTOMY     AORTIC VALVE REPLACEMENT N/A 08/23/2016   Procedure: AORTIC VALVE REPLACEMENT (AVR) - using partial Upper Sternotomy- 25mm Edwards Intuity Aortic Valve used;  Surgeon: Purcell Nails, MD;  Location: [facility] OR;  Service: Open Heart Surgery;  Laterality: N/A;   BLADDER SUSPENSION  2007   BREAST ENHANCEMENT SURGERY  1980   CARPAL TUNNEL RELEASE Bilateral    COLONOSCOPY     DORSAL COMPARTMENT RELEASE Right 09/15/2014   Procedure: RIGHT WRIST DEQUERVAINS RELEASE ;  Surgeon: Mckinley Jewel,  MD;  Location: [facility];  Service: Orthopedics;  Laterality: Right;   DRUG INDUCED ENDOSCOPY N/A 02/19/2022   Procedure: DRUG INDUCED SLEEP ENDOSCOPY;  Surgeon: Osborn Coho, MD;  Location: [facility];  Service: ENT;  Laterality: N/A;   ESOPHAGOGASTRODUODENOSCOPY     EYE SURGERY     cataract surgery   IMPLANTATION OF HYPOGLOSSAL NERVE STIMULATOR Right 04/23/2022   Procedure: IMPLANTATION OF HYPOGLOSSAL NERVE STIMULATOR;  Surgeon: Osborn Coho, MD;  Location: [facility];  Service: ENT;  Laterality: Right;   KNEE ARTHROSCOPY  04/15/2012   Procedure: ARTHROSCOPY KNEE;  Surgeon: Loreta Ave, MD;  Location: [facility] OR;  Service: Orthopedics;  Laterality: Right;   LAPAROSCOPIC CHOLECYSTECTOMY     LASIK     OOPHORECTOMY     PALATE TO GINGIVA GRAFT  2017   REVERSE SHOULDER ARTHROPLASTY Left 02/13/2021   Procedure: REVERSE SHOULDER ARTHROPLASTY;  Surgeon: Teryl Lucy, MD;  Location: [facility];  Service: Orthopedics;  Laterality: Left;   RIGHT/LEFT HEART CATH AND CORONARY ANGIOGRAPHY N/A 08/02/2016   Procedure: Right/Left Heart Cath and Coronary Angiography;  Surgeon: Tonny Bollman, MD;  Location: [facility] INVASIVE CV  LAB;  Service: Cardiovascular;  Laterality: N/A;   ROTATOR CUFF REPAIR Left    SHOULDER ARTHROSCOPY WITH ROTATOR CUFF REPAIR AND SUBACROMIAL DECOMPRESSION Right 12/24/2022   Procedure: SHOULDER ARTHROSCOPY WITH DEBRIDEMENT, ROTATOR CUFF REPAIR AND SUBACROMIAL DECOMPRESSION;  Surgeon: Teryl Lucy, MD;  Location: [facility];  Service: Orthopedics;  Laterality: Right;   TEE WITHOUT CARDIOVERSION N/A 08/23/2016   Procedure: TRANSESOPHAGEAL ECHOCARDIOGRAM (TEE);  Surgeon: Purcell Nails, MD;  Location: [facility] OR;  Service: Open Heart Surgery;  Laterality: N/A;   TOTAL KNEE ARTHROPLASTY  04/15/2012   Procedure: TOTAL KNEE ARTHROPLASTY;  Surgeon: Loreta Ave, MD;  Location: [facility] OR;  Service: Orthopedics;  Laterality: Right;   TRIGGER  FINGER RELEASE Right 04/20/2015   Procedure: RIGHT TRIGGER FINGER RELEASE (TENDON SHEALTH INCISION) ;  Surgeon: Loreta Ave, MD;  Location: [facility];  Service: Orthopedics;  Laterality: Right;   Patient Active Problem List   Diagnosis Date Noted   Class 2 severe obesity with serious comorbidity and body mass index (BMI) of 36.0 to 36.9 in adult [facility]) 05/09/2022   Type 2 diabetes mellitus with obesity (HCC) 05/09/2022   Hypertension associated with type 2 diabetes mellitus (HCC) 05/09/2022   SOB (shortness of breath) 12/10/2021   S/P reverse total shoulder arthroplasty, left 02/13/2021   Idiopathic hyperphosphatasia 08/03/2019   Vitamin D deficiency 06/04/2018   Hyponatremia 02/17/2018   History of CVA in adulthood 08/13/2017   Blurry vision 08/13/2017   Primary hypertension 01/02/2017   S/P minimally invasive aortic valve replacement with bioprosthetic valve 08/23/2016   Diastolic dysfunction    Osteopenia 05/27/2016   OSA (obstructive sleep apnea) 02/27/2016   Bruxism 10/23/2015   Elevated alkaline phosphatase level 05/16/2015   Atypical chest pain 11/10/2014   Acne 05/12/2014   Diabetes mellitus (HCC) 05/12/2014   GERD (gastroesophageal reflux disease) 03/10/2014   Generalized anxiety disorder 03/10/2014   Migraines 03/10/2014   EUSTACHIAN TUBE DYSFUNCTION, CHRONIC 06/14/2010   Irritable bowel syndrome 12/15/2008   Low back pain 08/11/2008   Hyperlipidemia associated with type 2 diabetes mellitus (HCC) 02/09/2008   Allergic rhinitis 02/12/2007   Asthma 02/12/2007    PCP: Shelva Majestic, MD  REFERRING PROVIDER: Teryl Lucy, MD  REFERRING DIAG: Rt Shoulder Arthroscopy 12/24/22   THERAPY DIAG:  Stiffness of right shoulder, not elsewhere classified  Muscle weakness (generalized)  Localized edema  Acute postoperative pain of right shoulder  Rationale for Evaluation and Treatment: Rehabilitation  ONSET DATE: 12/24/22  SUBJECTIVE:  SUBJECTIVE STATEMENT:  Patient reports no new issues.   Hand dominance: Right  PERTINENT HISTORY: Per order: S/P R shoulder arthroscopy with RC repair, distal clavicle excision and acromioplasty 12/24/22 Supraspinatus complete tear, biceps tendon with significant tearing Surgical: PROCEDURE: Right shoulder arthroscopy with extensive debridement of biceps tendon, superior labrum, subdeltoid bursa, with acromioplasty, distal clavicle resection, and supraspinatus repair    PAIN:  Are you having pain? Yes: NPRS scale: 3/10 Pain location: R shoulder Pain description: aching Aggravating factors: Has trouble getting comfortable at night. Relieving factors: Tramadol at  night, Tylenol in am  PRECAUTIONS: Shoulder  RED FLAGS: None   WEIGHT BEARING RESTRICTIONS: Patient reports none given  FALLS:  Has patient fallen in last 6 months? No  LIVING ENVIRONMENT: Lives with: lives with their family and lives with their spouse Lives in: House/apartment Stairs: Yes: External: 5 steps; on left going up Has following equipment at home: None  OCCUPATION: Retired  PLOF: Independent  PATIENT GOALS:Patient would like to regain her motion and strength in the R arm, be able to sleep  NEXT MD VISIT:   OBJECTIVE:   DIAGNOSTIC FINDINGS:  N/A  COGNITION: Overall cognitive status: Within functional limits for tasks assessed     SENSATION: WFL  POSTURE: Round shoulders, flexed posture, dowager's hump  UPPER EXTREMITY ROM:   P/A ROM Right eval Left eval AA/Act on finger ladder  Standing 02/20/23 RT  Shoulder flexion 50/105  130 / 115  Shoulder extension     Shoulder abduction 46/58  120/ 95  Shoulder adduction     Shoulder internal rotation WNL    Shoulder external rotation 30/45    Elbow flexion     Elbow  extension     Wrist flexion     Wrist extension     Wrist ulnar deviation     Wrist radial deviation     Wrist pronation     Wrist supination     (Blank rows = not tested)  UPPER EXTREMITY MMT: 3-/5 R shoulder. Elbow, wrist at least 3+/5  JOINT MOBILITY TESTING:  G-H deferred, scapula ROM limited, stiff  PALPATION:  TTP over supraspinatus, pects with tihgtness in pects and cervical paraspinals.   TODAY'S TREATMENT:                                                                                                                                         DATE:  03/06/23 UBE L3 x 3 minutes forward and back Seated rows and Lats 15# 2 sets 10 Chest press with serratus 5# 2 sets 10 2 Shelf taps, 2# weight x 10, then 2 1/2# weight x 5 Overhead flexion with 2# weight, facing wall. STM to UT, pects F/B stretch into flex, abd, horizontal abd VASO x 10 minutes, med pressure, to R shoulder for pain and inflammation after STM and stretch.  03/04/23 UBE L3 3 min forward and back  Seated rows and Lats 15# 2 sets 10 Chest press with serratus 5# 2 sets 10 2# standing cane ex 10 x Bent over row, horizontal shoulder abd, shoulder ext, with 2# weight 2 x 10 reps STM to UT, pects, lats on R, scapular mobs, GH joint mobs, Grade III for inf and post glide, PROM into flex, abd, ER VASO to R shoulder x 10 min, med pressure for pain and inflammation  02/27/23 UBE L3 3 min forward and back Wall slides flex, scaption, abd x 10 each. Shoulder IR and ER against Red Tband, 2 x 10 STM to UT, pects, lats on R, scapular mobs, GH joint mobs, Grade III for inf and post glide, PROM into flex, abd, ER Bent over row, horizontal shoulder abd, shoulder ext, with 2# weight 2 x 10 reps VASO to R shoulder x 10 min, med pressure for pain and inflammation  02/25/23 UBE L 3 each way Red tband shld ext,row and ER 2 sets 10 Red tband bicep curl 2 sets 10 cabinet taps 2 shelves 1# wt 10 x flex and abd 10 x each 2# waTe  bar standing AA ex 12 x each STM to pects and UT on R with caudal distraction, scapular mobs, and P stretch into flex, abd, ER VASO x 10 minutes to R shoulder, med pressure, for pain and inflammation  02/20/23 UBE L 2 3 min each way Finger ladder x 10 in flex and abd RT UE  AAROM with dowel for flex, abd, ext, IR/ER x 10 each Cabinet taps in flex to second shelf and abd to 2nd shelf x 10 each Yellow tband shld ext,row and ER 2 sets 10 STM to pects and UT on R with caudal distraction, scapular mobs, and P stretch into flex, abd, ER VASO x 10 minutes to R shoulder, med pressure, for pain and inflammation  02/17/23 UBE L1 x 3 min for and back Finger ladder x 10 in flex and abd AAROM with dowel for flex, abd, ext, IR/ER x 10 each Cabinet taps in flex to second shelf and abd to first shelf x 10 each Ball on wall serratus press, circles x 10 in each direction, VC and TC to slow down and maintain pressure. STM to pects and UT on R with caudal distraction, scapular mobs, and P stretch into flex, abd, ER VASO x 10 minutes to R shoulder, med pressure, for pain and inflammation  02/13/23 PROM RT shld all directions Gentle joint mobs Gentle STW to shld and upper arm Finger ladder flex and abd 8 x each Ball rolling on mat AA cane ex standing 10 x each Yellow tband shld ext and row 10 x each VASO RT shld after for soreness and inflammation  02/10/23 Education Table slides into flex and abd, scap retraction, elbow, wrist, forearm exercises-active  PATIENT EDUCATION: Education details: POC Person educated: Patient Education method: Explanation Education comprehension: verbalized understanding  HOME EXERCISE PROGRAM: [id number]  ASSESSMENT:  CLINICAL IMPRESSION: Pt reports no new issues. She has progressed her HEP to include shoulder elevation with 2# weights. Continued with POC, progressing strength and ROM as tolerated today. Introduced some overhead stability.  OBJECTIVE IMPAIRMENTS:  decreased activity tolerance, decreased balance, decreased coordination, decreased ROM, decreased strength, impaired flexibility, impaired UE functional use, improper body mechanics, postural dysfunction, and pain.   ACTIVITY LIMITATIONS: carrying, lifting, sleeping, and reach over head  PARTICIPATION LIMITATIONS: meal prep, cleaning, laundry, driving, shopping, and community activity  PERSONAL FACTORS: Past/current experiences are also affecting  patient's functional outcome.   REHAB POTENTIAL: Good  CLINICAL DECISION MAKING: Evolving/moderate complexity  EVALUATION COMPLEXITY: Moderate   GOALS: Goals reviewed with patient? Yes  SHORT TERM GOALS: Target date: 02/28/23  I with initial HEP Baseline: Goal status: 02/13/23 MET  LONG TERM GOALS: Target date: 05/05/23  I with final HEP Baseline:  Goal status: INITIAL  2.  Patient will recover at least 120 degrees of active R shoulder flexion and abduction Baseline: See ROM Goal status: 02/20/23 progressing  3.  Increase RUE shoulder strength to at least 4/5 Baseline: 3-/5 Goal status: INITIAL  4.  Patient will report the ability to perform all of her normal daily activities with RU. Baseline: Severely limited functionally Goal status: 03/06/23-She is slowly returning to function, but still limited in overhead activities. Ongoing  5.  Decrease R shoulder pain to < 2/10 with all activity. Baseline: 4/10 Goal status: 03/06/23 3/10 when stretching it. Ongoing  PLAN:  PT FREQUENCY: 1-2x/week  PT DURATION: 12 weeks  PLANNED INTERVENTIONS: Therapeutic exercises, Therapeutic activity, Neuromuscular re-education, Balance training, Gait training, Patient/Family education, Self Care, Joint mobilization, Dry Needling, Electrical stimulation, Cryotherapy, Moist heat, Vasopneumatic device, Ionotophoresis 4mg /ml Dexamethasone, and Manual therapy  PLAN FOR NEXT SESSION: Check ROM and goals   Patient Details  Name: [name] MRN: [id number] Date of Birth: 31-Dec-1950 Referring Provider:  Teryl Lucy, MD  Encounter Date: 03/06/2023   Iona Beard, DPT 03/06/2023, 4:04 PM  [place]  [facility] [address] [place], [place], [address] Phone: [phone]   Fax:  [phone]

## 2023-03-10 ENCOUNTER — Encounter: Payer: Self-pay | Admitting: Physical Therapy

## 2023-03-10 ENCOUNTER — Ambulatory Visit: Payer: Medicare PPO | Admitting: Physical Therapy

## 2023-03-10 DIAGNOSIS — M25611 Stiffness of right shoulder, not elsewhere classified: Secondary | ICD-10-CM | POA: Diagnosis not present

## 2023-03-10 DIAGNOSIS — Z9889 Other specified postprocedural states: Secondary | ICD-10-CM | POA: Diagnosis not present

## 2023-03-10 DIAGNOSIS — M6281 Muscle weakness (generalized): Secondary | ICD-10-CM | POA: Diagnosis not present

## 2023-03-10 DIAGNOSIS — R600 Localized edema: Secondary | ICD-10-CM

## 2023-03-10 DIAGNOSIS — G8918 Other acute postprocedural pain: Secondary | ICD-10-CM

## 2023-03-10 DIAGNOSIS — M25811 Other specified joint disorders, right shoulder: Secondary | ICD-10-CM | POA: Diagnosis not present

## 2023-03-10 DIAGNOSIS — M25511 Pain in right shoulder: Secondary | ICD-10-CM | POA: Diagnosis not present

## 2023-03-10 NOTE — Therapy (Signed)
her HEP to include shoulder elevation with 2# weights. Continued with POC, progressing strength and ROM as tolerated today. Re-assessed goals for update with progress noted in all areas.  OBJECTIVE IMPAIRMENTS: decreased activity tolerance, decreased balance, decreased coordination, decreased ROM, decreased strength, impaired flexibility, impaired UE functional use, improper body mechanics, postural dysfunction, and pain.   ACTIVITY LIMITATIONS: carrying, lifting, sleeping, and reach over head  PARTICIPATION LIMITATIONS: meal prep, cleaning, laundry, driving, shopping, and community activity  PERSONAL FACTORS: Past/current experiences are also affecting patient's functional outcome.   REHAB POTENTIAL: Good  CLINICAL DECISION MAKING: Evolving/moderate complexity  EVALUATION COMPLEXITY: Moderate   GOALS: Goals reviewed with patient? Yes  SHORT TERM GOALS: Target date: 02/28/23  I with initial HEP Baseline: Goal status: 02/13/23 MET  LONG TERM GOALS: Target date: 05/05/23  I with final HEP Baseline:  Goal status: 03/10/23-ongoing  2.  Patient will recover at least 120 degrees of active R shoulder flexion and abduction Baseline: See ROM Goal status: 03/10/23 progressing  3.  Increase RUE shoulder strength to at least 4/5 Baseline: 3-/5 Goal status: I9/16/24-flex 3+/5, Abd 3/5  4.  Patient will report the ability to perform all of her normal daily activities with RU. Baseline: Severely limited functionally Goal status: 03/06/23-She is slowly returning to function, but still limited in overhead activities. Ongoing  5.  Decrease R shoulder pain to < 2/10 with all activity. Baseline: 4/10 Goal status: 03/06/23 3/10 when stretching it. Ongoing  PLAN:  PT FREQUENCY: 1-2x/week  PT DURATION: 12  weeks  PLANNED INTERVENTIONS: Therapeutic exercises, Therapeutic activity, Neuromuscular re-education, Balance training, Gait training, Patient/Family education, Self Care, Joint mobilization, Dry Needling, Electrical stimulation, Cryotherapy, Moist heat, Vasopneumatic device, Ionotophoresis 4mg /ml Dexamethasone, and Manual therapy  PLAN FOR NEXT SESSION: deep STM and stretch, scap stabilization and shoulder strength.   Patient Details  Name: [name] MRN: [id number] Date of Birth: 1951/04/13 Referring Provider:  Teryl Lucy, MD  Encounter Date: 03/10/2023   Iona Beard, DPT 03/10/2023, 3:36 PM  [facility] [address]. [place], [place], [address] Phone: [phone]   Fax:  [phone]  her HEP to include shoulder elevation with 2# weights. Continued with POC, progressing strength and ROM as tolerated today. Re-assessed goals for update with progress noted in all areas.  OBJECTIVE IMPAIRMENTS: decreased activity tolerance, decreased balance, decreased coordination, decreased ROM, decreased strength, impaired flexibility, impaired UE functional use, improper body mechanics, postural dysfunction, and pain.   ACTIVITY LIMITATIONS: carrying, lifting, sleeping, and reach over head  PARTICIPATION LIMITATIONS: meal prep, cleaning, laundry, driving, shopping, and community activity  PERSONAL FACTORS: Past/current experiences are also affecting patient's functional outcome.   REHAB POTENTIAL: Good  CLINICAL DECISION MAKING: Evolving/moderate complexity  EVALUATION COMPLEXITY: Moderate   GOALS: Goals reviewed with patient? Yes  SHORT TERM GOALS: Target date: 02/28/23  I with initial HEP Baseline: Goal status: 02/13/23 MET  LONG TERM GOALS: Target date: 05/05/23  I with final HEP Baseline:  Goal status: 03/10/23-ongoing  2.  Patient will recover at least 120 degrees of active R shoulder flexion and abduction Baseline: See ROM Goal status: 03/10/23 progressing  3.  Increase RUE shoulder strength to at least 4/5 Baseline: 3-/5 Goal status: I9/16/24-flex 3+/5, Abd 3/5  4.  Patient will report the ability to perform all of her normal daily activities with RU. Baseline: Severely limited functionally Goal status: 03/06/23-She is slowly returning to function, but still limited in overhead activities. Ongoing  5.  Decrease R shoulder pain to < 2/10 with all activity. Baseline: 4/10 Goal status: 03/06/23 3/10 when stretching it. Ongoing  PLAN:  PT FREQUENCY: 1-2x/week  PT DURATION: 12  weeks  PLANNED INTERVENTIONS: Therapeutic exercises, Therapeutic activity, Neuromuscular re-education, Balance training, Gait training, Patient/Family education, Self Care, Joint mobilization, Dry Needling, Electrical stimulation, Cryotherapy, Moist heat, Vasopneumatic device, Ionotophoresis 4mg /ml Dexamethasone, and Manual therapy  PLAN FOR NEXT SESSION: deep STM and stretch, scap stabilization and shoulder strength.   Patient Details  Name: [name] MRN: [id number] Date of Birth: 1951/04/13 Referring Provider:  Teryl Lucy, MD  Encounter Date: 03/10/2023   Iona Beard, DPT 03/10/2023, 3:36 PM  [facility] [address]. [place], [place], [address] Phone: [phone]   Fax:  [phone]  OUTPATIENT PHYSICAL THERAPY SHOULDER    Patient Name: [name] MRN: [id number] DOB:01-Sep-1950, 72 y.o., female Today's Date: 03/10/2023  END OF SESSION:  PT End of Session - 03/10/23 1504     Visit Number 9    Date for PT Re-Evaluation 05/05/23    Activity Tolerance Patient limited by pain    Behavior During Therapy [facility] for tasks assessed/performed                Past Medical History:  Diagnosis Date   Allergy    Anxiety    Arthritis    back & knee   Asthma     mild per pt shows up with resp illness   Colon polyps    Constipation    Diabetes mellitus without complication (HCC)    Gallstones    Gastric polyps    Gastroparesis    GERD (gastroesophageal reflux disease)    Headache    sinus headaches and migraines at times   Heart murmur    History of migraine headaches    HTN (hypertension)    Hyperlipidemia    IBS (irritable bowel syndrome)    Joint pain    Lumbar disc disease    PONV (postoperative nausea and vomiting)    S/P aortic valve replacement with bioprosthetic valve 08/23/2016   25 mm Edwards Intuity rapid-deployment bovine pericardial tissue valve via partial upper mini sternotomy   Sleep apnea    does not use CPAP   TIA (transient ischemic attack)    TIA (transient ischemic attack)    hx of per pt   Past Surgical History:  Procedure Laterality Date   ABDOMINAL HYSTERECTOMY     AORTIC VALVE REPLACEMENT N/A 08/23/2016   Procedure: AORTIC VALVE REPLACEMENT (AVR) - using partial Upper Sternotomy- 25mm Edwards Intuity Aortic Valve used;  Surgeon: Purcell Nails, MD;  Location: [facility] OR;  Service: Open Heart Surgery;  Laterality: N/A;   BLADDER SUSPENSION  2007   BREAST ENHANCEMENT SURGERY  1980   CARPAL TUNNEL RELEASE Bilateral    COLONOSCOPY     DORSAL COMPARTMENT RELEASE Right 09/15/2014   Procedure: RIGHT WRIST DEQUERVAINS RELEASE ;  Surgeon: Mckinley Jewel, MD;  Location: [facility];  Service: Orthopedics;  Laterality:  Right;   DRUG INDUCED ENDOSCOPY N/A 02/19/2022   Procedure: DRUG INDUCED SLEEP ENDOSCOPY;  Surgeon: Osborn Coho, MD;  Location: [facility];  Service: ENT;  Laterality: N/A;   ESOPHAGOGASTRODUODENOSCOPY     EYE SURGERY     cataract surgery   IMPLANTATION OF HYPOGLOSSAL NERVE STIMULATOR Right 04/23/2022   Procedure: IMPLANTATION OF HYPOGLOSSAL NERVE STIMULATOR;  Surgeon: Osborn Coho, MD;  Location: [facility];  Service: ENT;  Laterality: Right;   KNEE ARTHROSCOPY  04/15/2012   Procedure: ARTHROSCOPY KNEE;  Surgeon: Loreta Ave, MD;  Location: [facility] OR;  Service: Orthopedics;  Laterality: Right;   LAPAROSCOPIC CHOLECYSTECTOMY     LASIK     OOPHORECTOMY     PALATE TO GINGIVA GRAFT  2017   REVERSE SHOULDER ARTHROPLASTY Left 02/13/2021   Procedure: REVERSE SHOULDER ARTHROPLASTY;  Surgeon: Teryl Lucy, MD;  Location: [facility];  Service: Orthopedics;  Laterality: Left;   RIGHT/LEFT HEART CATH AND CORONARY ANGIOGRAPHY N/A 08/02/2016   Procedure: Right/Left Heart Cath and Coronary Angiography;  Surgeon: Tonny Bollman, MD;  Location: [facility] INVASIVE CV LAB;  Service: Cardiovascular;  Laterality: N/A;   ROTATOR CUFF REPAIR Left    SHOULDER ARTHROSCOPY WITH ROTATOR CUFF REPAIR AND  her HEP to include shoulder elevation with 2# weights. Continued with POC, progressing strength and ROM as tolerated today. Re-assessed goals for update with progress noted in all areas.  OBJECTIVE IMPAIRMENTS: decreased activity tolerance, decreased balance, decreased coordination, decreased ROM, decreased strength, impaired flexibility, impaired UE functional use, improper body mechanics, postural dysfunction, and pain.   ACTIVITY LIMITATIONS: carrying, lifting, sleeping, and reach over head  PARTICIPATION LIMITATIONS: meal prep, cleaning, laundry, driving, shopping, and community activity  PERSONAL FACTORS: Past/current experiences are also affecting patient's functional outcome.   REHAB POTENTIAL: Good  CLINICAL DECISION MAKING: Evolving/moderate complexity  EVALUATION COMPLEXITY: Moderate   GOALS: Goals reviewed with patient? Yes  SHORT TERM GOALS: Target date: 02/28/23  I with initial HEP Baseline: Goal status: 02/13/23 MET  LONG TERM GOALS: Target date: 05/05/23  I with final HEP Baseline:  Goal status: 03/10/23-ongoing  2.  Patient will recover at least 120 degrees of active R shoulder flexion and abduction Baseline: See ROM Goal status: 03/10/23 progressing  3.  Increase RUE shoulder strength to at least 4/5 Baseline: 3-/5 Goal status: I9/16/24-flex 3+/5, Abd 3/5  4.  Patient will report the ability to perform all of her normal daily activities with RU. Baseline: Severely limited functionally Goal status: 03/06/23-She is slowly returning to function, but still limited in overhead activities. Ongoing  5.  Decrease R shoulder pain to < 2/10 with all activity. Baseline: 4/10 Goal status: 03/06/23 3/10 when stretching it. Ongoing  PLAN:  PT FREQUENCY: 1-2x/week  PT DURATION: 12  weeks  PLANNED INTERVENTIONS: Therapeutic exercises, Therapeutic activity, Neuromuscular re-education, Balance training, Gait training, Patient/Family education, Self Care, Joint mobilization, Dry Needling, Electrical stimulation, Cryotherapy, Moist heat, Vasopneumatic device, Ionotophoresis 4mg /ml Dexamethasone, and Manual therapy  PLAN FOR NEXT SESSION: deep STM and stretch, scap stabilization and shoulder strength.   Patient Details  Name: [name] MRN: [id number] Date of Birth: 1951/04/13 Referring Provider:  Teryl Lucy, MD  Encounter Date: 03/10/2023   Iona Beard, DPT 03/10/2023, 3:36 PM  [facility] [address]. [place], [place], [address] Phone: [phone]   Fax:  [phone]  OUTPATIENT PHYSICAL THERAPY SHOULDER    Patient Name: [name] MRN: [id number] DOB:01-Sep-1950, 72 y.o., female Today's Date: 03/10/2023  END OF SESSION:  PT End of Session - 03/10/23 1504     Visit Number 9    Date for PT Re-Evaluation 05/05/23    Activity Tolerance Patient limited by pain    Behavior During Therapy [facility] for tasks assessed/performed                Past Medical History:  Diagnosis Date   Allergy    Anxiety    Arthritis    back & knee   Asthma     mild per pt shows up with resp illness   Colon polyps    Constipation    Diabetes mellitus without complication (HCC)    Gallstones    Gastric polyps    Gastroparesis    GERD (gastroesophageal reflux disease)    Headache    sinus headaches and migraines at times   Heart murmur    History of migraine headaches    HTN (hypertension)    Hyperlipidemia    IBS (irritable bowel syndrome)    Joint pain    Lumbar disc disease    PONV (postoperative nausea and vomiting)    S/P aortic valve replacement with bioprosthetic valve 08/23/2016   25 mm Edwards Intuity rapid-deployment bovine pericardial tissue valve via partial upper mini sternotomy   Sleep apnea    does not use CPAP   TIA (transient ischemic attack)    TIA (transient ischemic attack)    hx of per pt   Past Surgical History:  Procedure Laterality Date   ABDOMINAL HYSTERECTOMY     AORTIC VALVE REPLACEMENT N/A 08/23/2016   Procedure: AORTIC VALVE REPLACEMENT (AVR) - using partial Upper Sternotomy- 25mm Edwards Intuity Aortic Valve used;  Surgeon: Purcell Nails, MD;  Location: [facility];  Service: Open Heart Surgery;  Laterality: N/A;   BLADDER SUSPENSION  2007   BREAST ENHANCEMENT SURGERY  1980   CARPAL TUNNEL RELEASE Bilateral    COLONOSCOPY     DORSAL COMPARTMENT RELEASE Right 09/15/2014   Procedure: RIGHT WRIST DEQUERVAINS RELEASE ;  Surgeon: Mckinley Jewel, MD;  Location: [facility];  Service: Orthopedics;  Laterality:  Right;   DRUG INDUCED ENDOSCOPY N/A 02/19/2022   Procedure: DRUG INDUCED SLEEP ENDOSCOPY;  Surgeon: Osborn Coho, MD;  Location: [facility];  Service: ENT;  Laterality: N/A;   ESOPHAGOGASTRODUODENOSCOPY     EYE SURGERY     cataract surgery   IMPLANTATION OF HYPOGLOSSAL NERVE STIMULATOR Right 04/23/2022   Procedure: IMPLANTATION OF HYPOGLOSSAL NERVE STIMULATOR;  Surgeon: Osborn Coho, MD;  Location: [facility];  Service: ENT;  Laterality: Right;   KNEE ARTHROSCOPY  04/15/2012   Procedure: ARTHROSCOPY KNEE;  Surgeon: Loreta Ave, MD;  Location: [facility] OR;  Service: Orthopedics;  Laterality: Right;   LAPAROSCOPIC CHOLECYSTECTOMY     LASIK     OOPHORECTOMY     PALATE TO GINGIVA GRAFT  2017   REVERSE SHOULDER ARTHROPLASTY Left 02/13/2021   Procedure: REVERSE SHOULDER ARTHROPLASTY;  Surgeon: Teryl Lucy, MD;  Location: [facility];  Service: Orthopedics;  Laterality: Left;   RIGHT/LEFT HEART CATH AND CORONARY ANGIOGRAPHY N/A 08/02/2016   Procedure: Right/Left Heart Cath and Coronary Angiography;  Surgeon: Tonny Bollman, MD;  Location: [facility] INVASIVE CV LAB;  Service: Cardiovascular;  Laterality: N/A;   ROTATOR CUFF REPAIR Left    SHOULDER ARTHROSCOPY WITH ROTATOR CUFF REPAIR AND

## 2023-03-13 ENCOUNTER — Ambulatory Visit: Payer: Medicare PPO | Admitting: Physical Therapy

## 2023-03-13 ENCOUNTER — Encounter: Payer: Self-pay | Admitting: Physical Therapy

## 2023-03-13 DIAGNOSIS — R600 Localized edema: Secondary | ICD-10-CM | POA: Diagnosis not present

## 2023-03-13 DIAGNOSIS — Z9889 Other specified postprocedural states: Secondary | ICD-10-CM

## 2023-03-13 DIAGNOSIS — M6281 Muscle weakness (generalized): Secondary | ICD-10-CM

## 2023-03-13 DIAGNOSIS — M25511 Pain in right shoulder: Secondary | ICD-10-CM | POA: Diagnosis not present

## 2023-03-13 DIAGNOSIS — M25811 Other specified joint disorders, right shoulder: Secondary | ICD-10-CM | POA: Diagnosis not present

## 2023-03-13 DIAGNOSIS — M25611 Stiffness of right shoulder, not elsewhere classified: Secondary | ICD-10-CM

## 2023-03-13 DIAGNOSIS — G8918 Other acute postprocedural pain: Secondary | ICD-10-CM | POA: Diagnosis not present

## 2023-03-13 NOTE — Therapy (Signed)
OUTPATIENT PHYSICAL THERAPY SHOULDER    Patient Name: [name] MRN: [id number] DOB:October 06, 1950, 72 y.o., female Today's Date: 03/13/2023  END OF SESSION:  PT End of Session - 03/13/23 1442     Visit Number 10    Date for PT Re-Evaluation 05/05/23    PT Start Time 1442    PT Stop Time 1520    PT Time Calculation (min) 38 min    Activity Tolerance Patient limited by pain    Behavior During Therapy [facility] for tasks assessed/performed            Past Medical History:  Diagnosis Date   Allergy    Anxiety    Arthritis    back & knee   Asthma     mild per pt shows up with resp illness   Colon polyps    Constipation    Diabetes mellitus without complication (HCC)    Gallstones    Gastric polyps    Gastroparesis    GERD (gastroesophageal reflux disease)    Headache    sinus headaches and migraines at times   Heart murmur    History of migraine headaches    HTN (hypertension)    Hyperlipidemia    IBS (irritable bowel syndrome)    Joint pain    Lumbar disc disease    PONV (postoperative nausea and vomiting)    S/P aortic valve replacement with bioprosthetic valve 08/23/2016   25 mm Edwards Intuity rapid-deployment bovine pericardial tissue valve via partial upper mini sternotomy   Sleep apnea    does not use CPAP   TIA (transient ischemic attack)    TIA (transient ischemic attack)    hx of per pt   Past Surgical History:  Procedure Laterality Date   ABDOMINAL HYSTERECTOMY     AORTIC VALVE REPLACEMENT N/A 08/23/2016   Procedure: AORTIC VALVE REPLACEMENT (AVR) - using partial Upper Sternotomy- 25mm Edwards Intuity Aortic Valve used;  Surgeon: Purcell Nails, MD;  Location: [facility];  Service: Open Heart Surgery;  Laterality: N/A;   BLADDER SUSPENSION  2007   BREAST ENHANCEMENT SURGERY  1980   CARPAL TUNNEL RELEASE Bilateral    COLONOSCOPY     DORSAL COMPARTMENT RELEASE Right 09/15/2014   Procedure: RIGHT WRIST DEQUERVAINS RELEASE ;  Surgeon: Mckinley Jewel, MD;   Location: [facility];  Service: Orthopedics;  Laterality: Right;   DRUG INDUCED ENDOSCOPY N/A 02/19/2022   Procedure: DRUG INDUCED SLEEP ENDOSCOPY;  Surgeon: Osborn Coho, MD;  Location: [facility];  Service: ENT;  Laterality: N/A;   ESOPHAGOGASTRODUODENOSCOPY     EYE SURGERY     cataract surgery   IMPLANTATION OF HYPOGLOSSAL NERVE STIMULATOR Right 04/23/2022   Procedure: IMPLANTATION OF HYPOGLOSSAL NERVE STIMULATOR;  Surgeon: Osborn Coho, MD;  Location: [facility];  Service: ENT;  Laterality: Right;   KNEE ARTHROSCOPY  04/15/2012   Procedure: ARTHROSCOPY KNEE;  Surgeon: Loreta Ave, MD;  Location: [facility] OR;  Service: Orthopedics;  Laterality: Right;   LAPAROSCOPIC CHOLECYSTECTOMY     LASIK     OOPHORECTOMY     PALATE TO GINGIVA GRAFT  2017   REVERSE SHOULDER ARTHROPLASTY Left 02/13/2021   Procedure: REVERSE SHOULDER ARTHROPLASTY;  Surgeon: Teryl Lucy, MD;  Location: [facility];  Service: Orthopedics;  Laterality: Left;   RIGHT/LEFT HEART CATH AND CORONARY ANGIOGRAPHY N/A 08/02/2016   Procedure: Right/Left Heart Cath and Coronary Angiography;  Surgeon: Tonny Bollman, MD;  Location: [facility] INVASIVE CV LAB;  Service: Cardiovascular;  OUTPATIENT PHYSICAL THERAPY SHOULDER    Patient Name: [name] MRN: [id number] DOB:October 06, 1950, 72 y.o., female Today's Date: 03/13/2023  END OF SESSION:  PT End of Session - 03/13/23 1442     Visit Number 10    Date for PT Re-Evaluation 05/05/23    PT Start Time 1442    PT Stop Time 1520    PT Time Calculation (min) 38 min    Activity Tolerance Patient limited by pain    Behavior During Therapy [facility] for tasks assessed/performed            Past Medical History:  Diagnosis Date   Allergy    Anxiety    Arthritis    back & knee   Asthma     mild per pt shows up with resp illness   Colon polyps    Constipation    Diabetes mellitus without complication (HCC)    Gallstones    Gastric polyps    Gastroparesis    GERD (gastroesophageal reflux disease)    Headache    sinus headaches and migraines at times   Heart murmur    History of migraine headaches    HTN (hypertension)    Hyperlipidemia    IBS (irritable bowel syndrome)    Joint pain    Lumbar disc disease    PONV (postoperative nausea and vomiting)    S/P aortic valve replacement with bioprosthetic valve 08/23/2016   25 mm Edwards Intuity rapid-deployment bovine pericardial tissue valve via partial upper mini sternotomy   Sleep apnea    does not use CPAP   TIA (transient ischemic attack)    TIA (transient ischemic attack)    hx of per pt   Past Surgical History:  Procedure Laterality Date   ABDOMINAL HYSTERECTOMY     AORTIC VALVE REPLACEMENT N/A 08/23/2016   Procedure: AORTIC VALVE REPLACEMENT (AVR) - using partial Upper Sternotomy- 25mm Edwards Intuity Aortic Valve used;  Surgeon: Purcell Nails, MD;  Location: [facility];  Service: Open Heart Surgery;  Laterality: N/A;   BLADDER SUSPENSION  2007   BREAST ENHANCEMENT SURGERY  1980   CARPAL TUNNEL RELEASE Bilateral    COLONOSCOPY     DORSAL COMPARTMENT RELEASE Right 09/15/2014   Procedure: RIGHT WRIST DEQUERVAINS RELEASE ;  Surgeon: Mckinley Jewel, MD;   Location: [facility];  Service: Orthopedics;  Laterality: Right;   DRUG INDUCED ENDOSCOPY N/A 02/19/2022   Procedure: DRUG INDUCED SLEEP ENDOSCOPY;  Surgeon: Osborn Coho, MD;  Location: [facility];  Service: ENT;  Laterality: N/A;   ESOPHAGOGASTRODUODENOSCOPY     EYE SURGERY     cataract surgery   IMPLANTATION OF HYPOGLOSSAL NERVE STIMULATOR Right 04/23/2022   Procedure: IMPLANTATION OF HYPOGLOSSAL NERVE STIMULATOR;  Surgeon: Osborn Coho, MD;  Location: [facility];  Service: ENT;  Laterality: Right;   KNEE ARTHROSCOPY  04/15/2012   Procedure: ARTHROSCOPY KNEE;  Surgeon: Loreta Ave, MD;  Location: [facility] OR;  Service: Orthopedics;  Laterality: Right;   LAPAROSCOPIC CHOLECYSTECTOMY     LASIK     OOPHORECTOMY     PALATE TO GINGIVA GRAFT  2017   REVERSE SHOULDER ARTHROPLASTY Left 02/13/2021   Procedure: REVERSE SHOULDER ARTHROPLASTY;  Surgeon: Teryl Lucy, MD;  Location: [facility];  Service: Orthopedics;  Laterality: Left;   RIGHT/LEFT HEART CATH AND CORONARY ANGIOGRAPHY N/A 08/02/2016   Procedure: Right/Left Heart Cath and Coronary Angiography;  Surgeon: Tonny Bollman, MD;  Location: [facility] INVAS[facility] CV LAB;  Service: Cardiovascular;  and inflammation  02/13/23 PROM RT shld all directions Gentle joint mobs Gentle STW to shld and upper arm Finger ladder flex and abd 8 x each Ball rolling on mat AA cane ex standing 10 x each Yellow tband shld ext and row 10 x each VASO RT shld after for soreness and inflammation  02/10/23 Education Table slides into flex and abd, scap retraction, elbow, wrist, forearm exercises-active  PATIENT EDUCATION: Education details: POC Person educated: Patient Education method: Explanation Education comprehension: verbalized understanding  HOME EXERCISE PROGRAM: [email or web address]  ASSESSMENT:  CLINICAL IMPRESSION: Pt reports some soreness after trying to open the car door with RUE. She did tolerate STM and stretch to shoulder, then performed some stability exercises. Finished treatment with VASO as she reports soreness and may have some inflammation.  OBJECTIVE IMPAIRMENTS: decreased activity tolerance, decreased balance, decreased coordination, decreased ROM, decreased strength, impaired flexibility, impaired UE functional use, improper body mechanics, postural dysfunction, and pain.   ACTIVITY LIMITATIONS: carrying, lifting, sleeping, and reach over head  PARTICIPATION LIMITATIONS: meal prep, cleaning, laundry, driving, shopping, and community activity  PERSONAL FACTORS: Past/current experiences are also affecting patient's functional outcome.   REHAB POTENTIAL: Good  CLINICAL DECISION MAKING: Evolving/moderate complexity  EVALUATION COMPLEXITY: Moderate   GOALS: Goals reviewed with patient? Yes  SHORT TERM GOALS: Target date: 02/28/23  I with initial HEP Baseline: Goal status: 02/13/23 MET  LONG TERM GOALS: Target date: 05/05/23  I with final HEP Baseline:  Goal status: 03/10/23-ongoing  2.  Patient will recover at least 120 degrees  of active R shoulder flexion and abduction Baseline: See ROM Goal status: 03/10/23 progressing  3.  Increase RUE shoulder strength to at least 4/5 Baseline: 3-/5 Goal status: I9/16/24-flex 3+/5, Abd 3/5  4.  Patient will report the ability to perform all of her normal daily activities with RU. Baseline: Severely limited functionally Goal status: 03/06/23-She is slowly returning to function, but still limited in overhead activities. Ongoing  5.  Decrease R shoulder pain to < 2/10 with all activity. Baseline: 4/10 Goal status: 03/06/23 3/10 when stretching it. Ongoing  PLAN:  PT FREQUENCY: 1-2x/week  PT DURATION: 12 weeks  PLANNED INTERVENTIONS: Therapeutic exercises, Therapeutic activity, Neuromuscular re-education, Balance training, Gait training, Patient/Family education, Self Care, Joint mobilization, Dry Needling, Electrical stimulation, Cryotherapy, Moist heat, Vasopneumatic device, Ionotophoresis 4mg /ml Dexamethasone, and Manual therapy  PLAN FOR NEXT SESSION: deep STM and stretch, scap stabilization and shoulder strength.   Patient Details  Name: DRISHTI SYBESMA MRN: [id number] Date of Birth: [birth year] Referring Provider:  Teryl Lucy, MD  Encounter Date: 03/13/2023   Iona Beard, DPT 03/13/2023, 3:19 PM  [facility] [address] [place], [place], [address] Phone: [phone]   Fax:  [phone]  and inflammation  02/13/23 PROM RT shld all directions Gentle joint mobs Gentle STW to shld and upper arm Finger ladder flex and abd 8 x each Ball rolling on mat AA cane ex standing 10 x each Yellow tband shld ext and row 10 x each VASO RT shld after for soreness and inflammation  02/10/23 Education Table slides into flex and abd, scap retraction, elbow, wrist, forearm exercises-active  PATIENT EDUCATION: Education details: POC Person educated: Patient Education method: Explanation Education comprehension: verbalized understanding  HOME EXERCISE PROGRAM: [email or web address]  ASSESSMENT:  CLINICAL IMPRESSION: Pt reports some soreness after trying to open the car door with RUE. She did tolerate STM and stretch to shoulder, then performed some stability exercises. Finished treatment with VASO as she reports soreness and may have some inflammation.  OBJECTIVE IMPAIRMENTS: decreased activity tolerance, decreased balance, decreased coordination, decreased ROM, decreased strength, impaired flexibility, impaired UE functional use, improper body mechanics, postural dysfunction, and pain.   ACTIVITY LIMITATIONS: carrying, lifting, sleeping, and reach over head  PARTICIPATION LIMITATIONS: meal prep, cleaning, laundry, driving, shopping, and community activity  PERSONAL FACTORS: Past/current experiences are also affecting patient's functional outcome.   REHAB POTENTIAL: Good  CLINICAL DECISION MAKING: Evolving/moderate complexity  EVALUATION COMPLEXITY: Moderate   GOALS: Goals reviewed with patient? Yes  SHORT TERM GOALS: Target date: 02/28/23  I with initial HEP Baseline: Goal status: 02/13/23 MET  LONG TERM GOALS: Target date: 05/05/23  I with final HEP Baseline:  Goal status: 03/10/23-ongoing  2.  Patient will recover at least 120 degrees  of active R shoulder flexion and abduction Baseline: See ROM Goal status: 03/10/23 progressing  3.  Increase RUE shoulder strength to at least 4/5 Baseline: 3-/5 Goal status: I9/16/24-flex 3+/5, Abd 3/5  4.  Patient will report the ability to perform all of her normal daily activities with RU. Baseline: Severely limited functionally Goal status: 03/06/23-She is slowly returning to function, but still limited in overhead activities. Ongoing  5.  Decrease R shoulder pain to < 2/10 with all activity. Baseline: 4/10 Goal status: 03/06/23 3/10 when stretching it. Ongoing  PLAN:  PT FREQUENCY: 1-2x/week  PT DURATION: 12 weeks  PLANNED INTERVENTIONS: Therapeutic exercises, Therapeutic activity, Neuromuscular re-education, Balance training, Gait training, Patient/Family education, Self Care, Joint mobilization, Dry Needling, Electrical stimulation, Cryotherapy, Moist heat, Vasopneumatic device, Ionotophoresis 4mg /ml Dexamethasone, and Manual therapy  PLAN FOR NEXT SESSION: deep STM and stretch, scap stabilization and shoulder strength.   Patient Details  Name: DRISHTI SYBESMA MRN: [id number] Date of Birth: [birth year] Referring Provider:  Teryl Lucy, MD  Encounter Date: 03/13/2023   Iona Beard, DPT 03/13/2023, 3:19 PM  [facility] [address] [place], [place], [address] Phone: [phone]   Fax:  [phone]  OUTPATIENT PHYSICAL THERAPY SHOULDER    Patient Name: [name] MRN: [id number] DOB:October 06, 1950, 72 y.o., female Today's Date: 03/13/2023  END OF SESSION:  PT End of Session - 03/13/23 1442     Visit Number 10    Date for PT Re-Evaluation 05/05/23    PT Start Time 1442    PT Stop Time 1520    PT Time Calculation (min) 38 min    Activity Tolerance Patient limited by pain    Behavior During Therapy [facility] for tasks assessed/performed            Past Medical History:  Diagnosis Date   Allergy    Anxiety    Arthritis    back & knee   Asthma     mild per pt shows up with resp illness   Colon polyps    Constipation    Diabetes mellitus without complication (HCC)    Gallstones    Gastric polyps    Gastroparesis    GERD (gastroesophageal reflux disease)    Headache    sinus headaches and migraines at times   Heart murmur    History of migraine headaches    HTN (hypertension)    Hyperlipidemia    IBS (irritable bowel syndrome)    Joint pain    Lumbar disc disease    PONV (postoperative nausea and vomiting)    S/P aortic valve replacement with bioprosthetic valve 08/23/2016   25 mm Edwards Intuity rapid-deployment bovine pericardial tissue valve via partial upper mini sternotomy   Sleep apnea    does not use CPAP   TIA (transient ischemic attack)    TIA (transient ischemic attack)    hx of per pt   Past Surgical History:  Procedure Laterality Date   ABDOMINAL HYSTERECTOMY     AORTIC VALVE REPLACEMENT N/A 08/23/2016   Procedure: AORTIC VALVE REPLACEMENT (AVR) - using partial Upper Sternotomy- 25mm Edwards Intuity Aortic Valve used;  Surgeon: Purcell Nails, MD;  Location: [facility];  Service: Open Heart Surgery;  Laterality: N/A;   BLADDER SUSPENSION  2007   BREAST ENHANCEMENT SURGERY  1980   CARPAL TUNNEL RELEASE Bilateral    COLONOSCOPY     DORSAL COMPARTMENT RELEASE Right 09/15/2014   Procedure: RIGHT WRIST DEQUERVAINS RELEASE ;  Surgeon: Mckinley Jewel, MD;   Location: [facility];  Service: Orthopedics;  Laterality: Right;   DRUG INDUCED ENDOSCOPY N/A 02/19/2022   Procedure: DRUG INDUCED SLEEP ENDOSCOPY;  Surgeon: Osborn Coho, MD;  Location: [facility];  Service: ENT;  Laterality: N/A;   ESOPHAGOGASTRODUODENOSCOPY     EYE SURGERY     cataract surgery   IMPLANTATION OF HYPOGLOSSAL NERVE STIMULATOR Right 04/23/2022   Procedure: IMPLANTATION OF HYPOGLOSSAL NERVE STIMULATOR;  Surgeon: Osborn Coho, MD;  Location: [facility];  Service: ENT;  Laterality: Right;   KNEE ARTHROSCOPY  04/15/2012   Procedure: ARTHROSCOPY KNEE;  Surgeon: Loreta Ave, MD;  Location: [facility];  Service: Orthopedics;  Laterality: Right;   LAPAROSCOPIC CHOLECYSTECTOMY     LASIK     OOPHORECTOMY     PALATE TO GINGIVA GRAFT  2017   REVERSE SHOULDER ARTHROPLASTY Left 02/13/2021   Procedure: REVERSE SHOULDER ARTHROPLASTY;  Surgeon: Teryl Lucy, MD;  Location: [facility];  Service: Orthopedics;  Laterality: Left;   RIGHT/LEFT HEART CATH AND CORONARY ANGIOGRAPHY N/A 08/02/2016   Procedure: Right/Left Heart Cath and Coronary Angiography;  Surgeon: Tonny Bollman, MD;  Location: [facility] IN[facility] LAB;  Service: Cardiovascular;

## 2023-03-17 ENCOUNTER — Encounter: Payer: Self-pay | Admitting: Physical Therapy

## 2023-03-17 ENCOUNTER — Ambulatory Visit: Payer: Medicare PPO | Admitting: Physical Therapy

## 2023-03-17 DIAGNOSIS — M25511 Pain in right shoulder: Secondary | ICD-10-CM

## 2023-03-17 DIAGNOSIS — Z9889 Other specified postprocedural states: Secondary | ICD-10-CM | POA: Diagnosis not present

## 2023-03-17 DIAGNOSIS — R600 Localized edema: Secondary | ICD-10-CM

## 2023-03-17 DIAGNOSIS — M6281 Muscle weakness (generalized): Secondary | ICD-10-CM | POA: Diagnosis not present

## 2023-03-17 DIAGNOSIS — M25811 Other specified joint disorders, right shoulder: Secondary | ICD-10-CM | POA: Diagnosis not present

## 2023-03-17 DIAGNOSIS — M25611 Stiffness of right shoulder, not elsewhere classified: Secondary | ICD-10-CM

## 2023-03-17 DIAGNOSIS — G8918 Other acute postprocedural pain: Secondary | ICD-10-CM

## 2023-03-17 NOTE — Therapy (Signed)
OUTPATIENT PHYSICAL THERAPY SHOULDER    Patient Name: [name] MRN: [id number] DOB:08-21-1950, 72 y.o., female Today's Date: 03/17/2023  END OF SESSION:  PT End of Session - 03/17/23 1451     Visit Number 11    Date for PT Re-Evaluation 05/05/23    PT Start Time 1452    PT Stop Time 1535    PT Time Calculation (min) 43 min    Activity Tolerance Patient limited by pain    Behavior During Therapy [facility] for tasks assessed/performed            Past Medical History:  Diagnosis Date   Allergy    Anxiety    Arthritis    back & knee   Asthma     mild per pt shows up with resp illness   Colon polyps    Constipation    Diabetes mellitus without complication (HCC)    Gallstones    Gastric polyps    Gastroparesis    GERD (gastroesophageal reflux disease)    Headache    sinus headaches and migraines at times   Heart murmur    History of migraine headaches    HTN (hypertension)    Hyperlipidemia    IBS (irritable bowel syndrome)    Joint pain    Lumbar disc disease    PONV (postoperative nausea and vomiting)    S/P aortic valve replacement with bioprosthetic valve 08/23/2016   25 mm Edwards Intuity rapid-deployment bovine pericardial tissue valve via partial upper mini sternotomy   Sleep apnea    does not use CPAP   TIA (transient ischemic attack)    TIA (transient ischemic attack)    hx of per pt   Past Surgical History:  Procedure Laterality Date   ABDOMINAL HYSTERECTOMY     AORTIC VALVE REPLACEMENT N/A 08/23/2016   Procedure: AORTIC VALVE REPLACEMENT (AVR) - using partial Upper Sternotomy- 25mm Edwards Intuity Aortic Valve used;  Surgeon: Purcell Nails, MD;  Location: [facility] OR;  Service: Open Heart Surgery;  Laterality: N/A;   BLADDER SUSPENSION  2007   BREAST ENHANCEMENT SURGERY  1980   CARPAL TUNNEL RELEASE Bilateral    COLONOSCOPY     DORSAL COMPARTMENT RELEASE Right 09/15/2014   Procedure: RIGHT WRIST DEQUERVAINS RELEASE ;  Surgeon: Mckinley Jewel, MD;   Location: [facility];  Service: Orthopedics;  Laterality: Right;   DRUG INDUCED ENDOSCOPY N/A 02/19/2022   Procedure: DRUG INDUCED SLEEP ENDOSCOPY;  Surgeon: Osborn Coho, MD;  Location: [facility];  Service: ENT;  Laterality: N/A;   ESOPHAGOGASTRODUODENOSCOPY     EYE SURGERY     cataract surgery   IMPLANTATION OF HYPOGLOSSAL NERVE STIMULATOR Right 04/23/2022   Procedure: IMPLANTATION OF HYPOGLOSSAL NERVE STIMULATOR;  Surgeon: Osborn Coho, MD;  Location: [facility];  Service: ENT;  Laterality: Right;   KNEE ARTHROSCOPY  04/15/2012   Procedure: ARTHROSCOPY KNEE;  Surgeon: Loreta Ave, MD;  Location: [facility] OR;  Service: Orthopedics;  Laterality: Right;   LAPAROSCOPIC CHOLECYSTECTOMY     LASIK     OOPHORECTOMY     PALATE TO GINGIVA GRAFT  2017   REVERSE SHOULDER ARTHROPLASTY Left 02/13/2021   Procedure: REVERSE SHOULDER ARTHROPLASTY;  Surgeon: Teryl Lucy, MD;  Location: [facility];  Service: Orthopedics;  Laterality: Left;   RIGHT/LEFT HEART CATH AND CORONARY ANGIOGRAPHY N/A 08/02/2016   Procedure: Right/Left Heart Cath and Coronary Angiography;  Surgeon: Tonny Bollman, MD;  Location: [facility] INVASIVE CV LAB;  Service: Cardiovascular;  Laterality: N/A;   ROTATOR CUFF REPAIR Left    SHOULDER ARTHROSCOPY WITH ROTATOR CUFF REPAIR AND SUBACROMIAL DECOMPRESSION Right 12/24/2022   Procedure: SHOULDER ARTHROSCOPY WITH DEBRIDEMENT, ROTATOR CUFF REPAIR AND SUBACROMIAL DECOMPRESSION;  Surgeon: Teryl Lucy, MD;  Location: [facility];  Service: Orthopedics;  Laterality: Right;   TEE WITHOUT CARDIOVERSION N/A 08/23/2016   Procedure: TRANSESOPHAGEAL ECHOCARDIOGRAM (TEE);  Surgeon: Purcell Nails, MD;  Location: [facility] OR;  Service: Open Heart Surgery;  Laterality: N/A;   TOTAL KNEE ARTHROPLASTY  04/15/2012   Procedure: TOTAL KNEE ARTHROPLASTY;  Surgeon: Loreta Ave, MD;  Location: [facility] OR;  Service: Orthopedics;  Laterality: Right;   TRIGGER FINGER  RELEASE Right 04/20/2015   Procedure: RIGHT TRIGGER FINGER RELEASE (TENDON SHEALTH INCISION) ;  Surgeon: Loreta Ave, MD;  Location: [facility];  Service: Orthopedics;  Laterality: Right;   Patient Active Problem List   Diagnosis Date Noted   Class 2 severe obesity with serious comorbidity and body mass index (BMI) of 36.0 to 36.9 in adult [facility]) 05/09/2022   Type 2 diabetes mellitus with obesity (HCC) 05/09/2022   Hypertension associated with type 2 diabetes mellitus (HCC) 05/09/2022   SOB (shortness of breath) 12/10/2021   S/P reverse total shoulder arthroplasty, left 02/13/2021   Idiopathic hyperphosphatasia 08/03/2019   Vitamin D deficiency 06/04/2018   Hyponatremia 02/17/2018   History of CVA in adulthood 08/13/2017   Blurry vision 08/13/2017   Primary hypertension 01/02/2017   S/P minimally invasive aortic valve replacement with bioprosthetic valve 08/23/2016   Diastolic dysfunction    Osteopenia 05/27/2016   OSA (obstructive sleep apnea) 02/27/2016   Bruxism 10/23/2015   Elevated alkaline phosphatase level 05/16/2015   Atypical chest pain 11/10/2014   Acne 05/12/2014   Diabetes mellitus (HCC) 05/12/2014   GERD (gastroesophageal reflux disease) 03/10/2014   Generalized anxiety disorder 03/10/2014   Migraines 03/10/2014   EUSTACHIAN TUBE DYSFUNCTION, CHRONIC 06/14/2010   Irritable bowel syndrome 12/15/2008   Low back pain 08/11/2008   Hyperlipidemia associated with type 2 diabetes mellitus (HCC) 02/09/2008   Allergic rhinitis 02/12/2007   Asthma 02/12/2007    PCP: Shelva Majestic, MD  REFERRING PROVIDER: Teryl Lucy, MD  REFERRING DIAG: Rt Shoulder Arthroscopy 12/24/22   THERAPY DIAG:  Stiffness of right shoulder, not elsewhere classified  Muscle weakness (generalized)  Localized edema  Acute postoperative pain of right shoulder  Rationale for Evaluation and Treatment: Rehabilitation  ONSET DATE: 12/24/22  SUBJECTIVE:  SUBJECTIVE STATEMENT:  Patient reports no new issues today.  Hand dominance: Right  PERTINENT HISTORY: Per order: S/P R shoulder arthroscopy with RC repair, distal clavicle excision and acromioplasty 12/24/22 Supraspinatus complete tear, biceps tendon with significant tearing Surgical: PROCEDURE: Right shoulder arthroscopy with extensive debridement of biceps tendon, superior labrum, subdeltoid bursa, with acromioplasty, distal clavicle resection, and supraspinatus repair    PAIN:  Are you having pain? Yes: NPRS scale: 3/10 Pain location: R shoulder Pain description: aching Aggravating factors: Has trouble getting comfortable at night. Relieving factors: Tramadol at  night, Tylenol in am  PRECAUTIONS: Shoulder  RED FLAGS: None   WEIGHT BEARING RESTRICTIONS: Patient reports none given  FALLS:  Has patient fallen in last 6 months? No  LIVING ENVIRONMENT: Lives with: lives with their family and lives with their spouse Lives in: House/apartment Stairs: Yes: External: 5 steps; on left going up Has following equipment at home: None  OCCUPATION: Retired  PLOF: Independent  PATIENT GOALS:Patient would like to regain her motion and strength in the R arm, be able to sleep  NEXT MD VISIT:   OBJECTIVE:   DIAGNOSTIC FINDINGS:  N/A  COGNITION: Overall cognitive status: Within functional limits for tasks assessed     SENSATION: WFL  POSTURE: Round shoulders, flexed posture, dowager's hump  UPPER EXTREMITY ROM:   P/A ROM Right eval Left eval AA/Act on finger ladder  Standing 02/20/23 RT R shoulder P/AROM 03/10/23  Shoulder flexion 50/105  130 / 115 137/116  Shoulder extension      Shoulder abduction 46/58  120/ 95 123/92  Shoulder adduction      Shoulder internal rotation WNL     Shoulder external rotation  30/45     Elbow flexion      Elbow extension      Wrist flexion      Wrist extension      Wrist ulnar deviation      Wrist radial deviation      Wrist pronation      Wrist supination      (Blank rows = not tested)  UPPER EXTREMITY MMT: 3-/5 R shoulder. Elbow, wrist at least 3+/5  JOINT MOBILITY TESTING:  G-H deferred, scapula ROM limited, stiff  PALPATION:  TTP over supraspinatus, pects with tihgtness in pects and cervical paraspinals.   TODAY'S TREATMENT:                                                                                                                                         DATE:  03/17/23 NuStep using arms and legs L5 x 6 minutes STM to R UT, cervical paraspinals, with cervical distraction, scapular mobs and passive stretch into depression, flex, abd, ER Seated lat pulls with passive stretch, 15#, 2 x 10 Standing row with rope for more narrow grip, 2 x 10, 10# Standing overhead flex and ext facing and then with back to wall, 3#  weight x 10 each. Unable to quite reach the wall when facing away. Body Blade in 90 felx and abd, horiz and vert, 15 sec each. VASO to R shoulder for pain and inflammation control x 10 min, med pressure.  03/13/23 UBE L1 x 3 min for/back STM to RUT, Pect, with stretch into shoulder flex and abd and ER, scapular mobs, G-H joint mobs into inf and post glide. Cross friction massage to R suprapinatus tendon. Seated press ups for postural stability. Serratus press against G tband x 10, Mod TC for correct form x 15 Standing shoulder flex and abd against Red Tband resistance. 10 each VASO, med pressure, 10 min to R shoulder for pain and inflammation.  03/10/23 UBE L3 x 3 min forward and back AAROM for R shoulder flex, abd, ER,  with dowel Shoulder strengthening with 2# WATE bar, flexion, abd Standing shoulder ext, rows with 5# resistance, ER with G tband, 2 x 10 each Wall slides with wall wash at top x 3, 5 reps Re-assessment for goal  status 3 way shoulder elevation with 2# dumbells, 10 reps each VASO x 10 min to R shoulder, med pressure  03/06/23 UBE L3 x 3 minutes forward and back Seated rows and Lats 15# 2 sets 10 Chest press with serratus 5# 2 sets 10 2 Shelf taps, 2# weight x 10, then 2 1/2# weight x 5 Overhead flexion with 2# weight, facing wall. STM to UT, pects F/B stretch into flex, abd, horizontal abd VASO x 10 minutes, med pressure, to R shoulder for pain and inflammation after STM and stretch.  03/04/23 UBE L3 3 min forward and back Seated rows and Lats 15# 2 sets 10 Chest press with serratus 5# 2 sets 10 2# standing cane ex 10 x Bent over row, horizontal shoulder abd, shoulder ext, with 2# weight 2 x 10 reps STM to UT, pects, lats on R, scapular mobs, GH joint mobs, Grade III for inf and post glide, PROM into flex, abd, ER VASO to R shoulder x 10 min, med pressure for pain and inflammation  02/27/23 UBE L3 3 min forward and back Wall slides flex, scaption, abd x 10 each. Shoulder IR and ER against Red Tband, 2 x 10 STM to UT, pects, lats on R, scapular mobs, GH joint mobs, Grade III for inf and post glide, PROM into flex, abd, ER Bent over row, horizontal shoulder abd, shoulder ext, with 2# weight 2 x 10 reps VASO to R shoulder x 10 min, med pressure for pain and inflammation  02/25/23 UBE L 3 each way Red tband shld ext,row and ER 2 sets 10 Red tband bicep curl 2 sets 10 cabinet taps 2 shelves 1# wt 10 x flex and abd 10 x each 2# waTe bar standing AA ex 12 x each STM to pects and UT on R with caudal distraction, scapular mobs, and P stretch into flex, abd, ER VASO x 10 minutes to R shoulder, med pressure, for pain and inflammation  02/20/23 UBE L 2 3 min each way Finger ladder x 10 in flex and abd RT UE  AAROM with dowel for flex, abd, ext, IR/ER x 10 each Cabinet taps in flex to second shelf and abd to 2nd shelf x 10 each Yellow tband shld ext,row and ER 2 sets 10 STM to pects and UT on R  with caudal distraction, scapular mobs, and P stretch into flex, abd, ER VASO x 10 minutes to R shoulder, med pressure, for pain  and inflammation  02/17/23 UBE L1 x 3 min for and back Finger ladder x 10 in flex and abd AAROM with dowel for flex, abd, ext, IR/ER x 10 each Cabinet taps in flex to second shelf and abd to first shelf x 10 each Ball on wall serratus press, circles x 10 in each direction, VC and TC to slow down and maintain pressure. STM to pects and UT on R with caudal distraction, scapular mobs, and P stretch into flex, abd, ER VASO x 10 minutes to R shoulder, med pressure, for pain and inflammation  02/13/23 PROM RT shld all directions Gentle joint mobs Gentle STW to shld and upper arm Finger ladder flex and abd 8 x each Ball rolling on mat AA cane ex standing 10 x each Yellow tband shld ext and row 10 x each VASO RT shld after for soreness and inflammation  02/10/23 Education Table slides into flex and abd, scap retraction, elbow, wrist, forearm exercises-active  PATIENT EDUCATION: Education details: POC Person educated: Patient Education method: Explanation Education comprehension: verbalized understanding  HOME EXERCISE PROGRAM: [id number]  ASSESSMENT:  CLINICAL IMPRESSION: Pt reports no new issues. Treatment continued to progress her strength and ROM of R shoulder, adding additional overhead activities and scapular stability exercises.  OBJECTIVE IMPAIRMENTS: decreased activity tolerance, decreased balance, decreased coordination, decreased ROM, decreased strength, impaired flexibility, impaired UE functional use, improper body mechanics, postural dysfunction, and pain.   ACTIVITY LIMITATIONS: carrying, lifting, sleeping, and reach over head  PARTICIPATION LIMITATIONS: meal prep, cleaning, laundry, driving, shopping, and community activity  PERSONAL FACTORS: Past/current experiences are also affecting patient's functional outcome.   REHAB POTENTIAL:  Good  CLINICAL DECISION MAKING: Evolving/moderate complexity  EVALUATION COMPLEXITY: Moderate   GOALS: Goals reviewed with patient? Yes  SHORT TERM GOALS: Target date: 02/28/23  I with initial HEP Baseline: Goal status: 02/13/23 MET  LONG TERM GOALS: Target date: 05/05/23  I with final HEP Baseline:  Goal status: 03/10/23-ongoing  2.  Patient will recover at least 120 degrees of active R shoulder flexion and abduction Baseline: See ROM Goal status: 03/10/23 progressing  3.  Increase RUE shoulder strength to at least 4/5 Baseline: 3-/5 Goal status: I9/23/24-flex 3+/5, Abd 3/5  4.  Patient will report the ability to perform all of her normal daily activities with RU. Baseline: Severely limited functionally Goal status: 03/06/23-She is slowly returning to function, but still limited in overhead activities. Ongoing  5.  Decrease R shoulder pain to < 2/10 with all activity. Baseline: 4/10 Goal status: 03/06/23 3/10 when stretching it. Ongoing  PLAN:  PT FREQUENCY: 1-2x/week  PT DURATION: 12 weeks  PLANNED INTERVENTIONS: Therapeutic exercises, Therapeutic activity, Neuromuscular re-education, Balance training, Gait training, Patient/Family education, Self Care, Joint mobilization, Dry Needling, Electrical stimulation, Cryotherapy, Moist heat, Vasopneumatic device, Ionotophoresis 4mg /ml Dexamethasone, and Manual therapy  PLAN FOR NEXT SESSION: deep STM and stretch, scap stabilization and shoulder strength.   Patient Details  Name: [name] MRN: [id number] Date of Birth: 1951-01-05 Referring Provider:  Teryl Lucy, MD  Encounter Date: 03/17/2023   Iona Beard, DPT 03/17/2023, 3:39 PM    Outpatient Rehabilitation at [facility] [address] [place], [place], [address] Phone: [phone]   Fax:  [phone]

## 2023-03-18 ENCOUNTER — Other Ambulatory Visit: Payer: Self-pay | Admitting: Family Medicine

## 2023-03-20 ENCOUNTER — Other Ambulatory Visit: Payer: Self-pay

## 2023-03-20 ENCOUNTER — Ambulatory Visit: Payer: Medicare PPO | Admitting: Physical Therapy

## 2023-03-20 ENCOUNTER — Encounter: Payer: Self-pay | Admitting: Physical Therapy

## 2023-03-20 DIAGNOSIS — R600 Localized edema: Secondary | ICD-10-CM

## 2023-03-20 DIAGNOSIS — E1159 Type 2 diabetes mellitus with other circulatory complications: Secondary | ICD-10-CM

## 2023-03-20 DIAGNOSIS — M6281 Muscle weakness (generalized): Secondary | ICD-10-CM

## 2023-03-20 DIAGNOSIS — M25811 Other specified joint disorders, right shoulder: Secondary | ICD-10-CM | POA: Diagnosis not present

## 2023-03-20 DIAGNOSIS — I10 Essential (primary) hypertension: Secondary | ICD-10-CM

## 2023-03-20 DIAGNOSIS — Z9889 Other specified postprocedural states: Secondary | ICD-10-CM | POA: Diagnosis not present

## 2023-03-20 DIAGNOSIS — M25611 Stiffness of right shoulder, not elsewhere classified: Secondary | ICD-10-CM

## 2023-03-20 DIAGNOSIS — M25511 Pain in right shoulder: Secondary | ICD-10-CM | POA: Diagnosis not present

## 2023-03-20 DIAGNOSIS — G8918 Other acute postprocedural pain: Secondary | ICD-10-CM | POA: Diagnosis not present

## 2023-03-20 DIAGNOSIS — Z953 Presence of xenogenic heart valve: Secondary | ICD-10-CM

## 2023-03-20 DIAGNOSIS — Z79899 Other long term (current) drug therapy: Secondary | ICD-10-CM

## 2023-03-20 MED ORDER — AMLODIPINE BESYLATE 10 MG PO TABS
10.0000 mg | ORAL_TABLET | Freq: Every day | ORAL | 1 refills | Status: DC
Start: 2023-03-20 — End: 2023-09-15

## 2023-03-20 MED ORDER — CLONIDINE HCL 0.1 MG PO TABS: 0.1000 mg | ORAL_TABLET | Freq: Every day | ORAL | 1 refills | Status: DC

## 2023-03-20 NOTE — Addendum Note (Signed)
Addended by: Margaret Pyle D on: 03/20/2023 08:11 AM   Modules accepted: Orders

## 2023-03-20 NOTE — Therapy (Signed)
deep STM and stretch, scap stabilization and  shoulder strength.   Patient Details  Name: [name] MRN: [id number] Date of Birth: [birth year] Referring Provider:  Teryl Lucy, MD  Encounter Date: 03/20/2023    Encounter Date: 03/20/2023   Suanne Marker, PTA 03/20/2023, 2:36 PM  [facility] [address] [place], [place], [address] Phone: [phone]   Fax:  [phone]  OUTPATIENT PHYSICAL THERAPY SHOULDER    Patient Name: [name] MRN: [id number] DOB:05-25-1951, 72 y.o., female Today's Date: 03/20/2023  END OF SESSION:  PT End of Session - 03/20/23 1435     Visit Number 12    Date for PT Re-Evaluation 05/05/23    PT Start Time 1440    PT Stop Time 1530    PT Time Calculation (min) 50 min            Past Medical History:  Diagnosis Date   Allergy    Anxiety    Arthritis    back & knee   Asthma     mild per pt shows up with resp illness   Colon polyps    Constipation    Diabetes mellitus without complication (HCC)    Gallstones    Gastric polyps    Gastroparesis    GERD (gastroesophageal reflux disease)    Headache    sinus headaches and migraines at times   Heart murmur    History of migraine headaches    HTN (hypertension)    Hyperlipidemia    IBS (irritable bowel syndrome)    Joint pain    Lumbar disc disease    PONV (postoperative nausea and vomiting)    S/P aortic valve replacement with bioprosthetic valve 08/23/2016   25 mm Edwards Intuity rapid-deployment bovine pericardial tissue valve via partial upper mini sternotomy   Sleep apnea    does not use CPAP   TIA (transient ischemic attack)    TIA (transient ischemic attack)    hx of per pt   Past Surgical History:  Procedure Laterality Date   ABDOMINAL HYSTERECTOMY     AORTIC VALVE REPLACEMENT N/A 08/23/2016   Procedure: AORTIC VALVE REPLACEMENT (AVR) - using partial Upper Sternotomy- 25mm Edwards Intuity Aortic Valve used;  Surgeon: Purcell Nails, MD;  Location: [facility];  Service: Open Heart Surgery;  Laterality: N/A;   BLADDER SUSPENSION  2007   BREAST ENHANCEMENT SURGERY  1980   CARPAL TUNNEL RELEASE Bilateral    COLONOSCOPY     DORSAL COMPARTMENT RELEASE Right 09/15/2014   Procedure: RIGHT WRIST DEQUERVAINS RELEASE ;  Surgeon: Mckinley Jewel, MD;  Location: [facility];  Service: Orthopedics;  Laterality: Right;   DRUG INDUCED ENDOSCOPY N/A  02/19/2022   Procedure: DRUG INDUCED SLEEP ENDOSCOPY;  Surgeon: Osborn Coho, MD;  Location: [facility];  Service: ENT;  Laterality: N/A;   ESOPHAGOGASTRODUODENOSCOPY     EYE SURGERY     cataract surgery   IMPLANTATION OF HYPOGLOSSAL NERVE STIMULATOR Right 04/23/2022   Procedure: IMPLANTATION OF HYPOGLOSSAL NERVE STIMULATOR;  Surgeon: Osborn Coho, MD;  Location:  SURGERY CENTER;  Service: ENT;  Laterality: Right;   KNEE ARTHROSCOPY  04/15/2012   Procedure: ARTHROSCOPY KNEE;  Surgeon: Loreta Ave, MD;  Location: [facility];  Service: Orthopedics;  Laterality: Right;   LAPAROSCOPIC CHOLECYSTECTOMY     LASIK     OOPHORECTOMY     PALATE TO GINGIVA GRAFT  2017   REVERSE SHOULDER ARTHROPLASTY Left 02/13/2021   Procedure: REVERSE SHOULDER ARTHROPLASTY;  Surgeon: Teryl Lucy, MD;  Location: [facility];  Service: Orthopedics;  Laterality: Left;   RIGHT/LEFT HEART CATH AND CORONARY ANGIOGRAPHY N/A 08/02/2016   Procedure: Right/Left Heart Cath and Coronary Angiography;  Surgeon: Tonny Bollman, MD;  Location: [facility] INVASIVE CV LAB;  Service: Cardiovascular;  Laterality: N/A;   ROTATOR CUFF REPAIR Left    SHOULDER ARTHROSCOPY WITH ROTATOR CUFF REPAIR AND  deep STM and stretch, scap stabilization and  shoulder strength.   Patient Details  Name: [name] MRN: [id number] Date of Birth: [birth year] Referring Provider:  Teryl Lucy, MD  Encounter Date: 03/20/2023    Encounter Date: 03/20/2023   Suanne Marker, PTA 03/20/2023, 2:36 PM  [facility] [address] [place], [place], [address] Phone: [phone]   Fax:  [phone]  deep STM and stretch, scap stabilization and  shoulder strength.   Patient Details  Name: [name] MRN: [id number] Date of Birth: [birth year] Referring Provider:  Teryl Lucy, MD  Encounter Date: 03/20/2023    Encounter Date: 03/20/2023   Suanne Marker, PTA 03/20/2023, 2:36 PM  [facility] [address] [place], [place], [address] Phone: [phone]   Fax:  [phone]  OUTPATIENT PHYSICAL THERAPY SHOULDER    Patient Name: [name] MRN: [id number] DOB:05-25-1951, 72 y.o., female Today's Date: 03/20/2023  END OF SESSION:  PT End of Session - 03/20/23 1435     Visit Number 12    Date for PT Re-Evaluation 05/05/23    PT Start Time 1440    PT Stop Time 1530    PT Time Calculation (min) 50 min            Past Medical History:  Diagnosis Date   Allergy    Anxiety    Arthritis    back & knee   Asthma     mild per pt shows up with resp illness   Colon polyps    Constipation    Diabetes mellitus without complication (HCC)    Gallstones    Gastric polyps    Gastroparesis    GERD (gastroesophageal reflux disease)    Headache    sinus headaches and migraines at times   Heart murmur    History of migraine headaches    HTN (hypertension)    Hyperlipidemia    IBS (irritable bowel syndrome)    Joint pain    Lumbar disc disease    PONV (postoperative nausea and vomiting)    S/P aortic valve replacement with bioprosthetic valve 08/23/2016   25 mm Edwards Intuity rapid-deployment bovine pericardial tissue valve via partial upper mini sternotomy   Sleep apnea    does not use CPAP   TIA (transient ischemic attack)    TIA (transient ischemic attack)    hx of per pt   Past Surgical History:  Procedure Laterality Date   ABDOMINAL HYSTERECTOMY     AORTIC VALVE REPLACEMENT N/A 08/23/2016   Procedure: AORTIC VALVE REPLACEMENT (AVR) - using partial Upper Sternotomy- 25mm Edwards Intuity Aortic Valve used;  Surgeon: Purcell Nails, MD;  Location: [facility];  Service: Open Heart Surgery;  Laterality: N/A;   BLADDER SUSPENSION  2007   BREAST ENHANCEMENT SURGERY  1980   CARPAL TUNNEL RELEASE Bilateral    COLONOSCOPY     DORSAL COMPARTMENT RELEASE Right 09/15/2014   Procedure: RIGHT WRIST DEQUERVAINS RELEASE ;  Surgeon: Mckinley Jewel, MD;  Location: [facility];  Service: Orthopedics;  Laterality: Right;   DRUG INDUCED ENDOSCOPY N/A  02/19/2022   Procedure: DRUG INDUCED SLEEP ENDOSCOPY;  Surgeon: Osborn Coho, MD;  Location: [facility];  Service: ENT;  Laterality: N/A;   ESOPHAGOGASTRODUODENOSCOPY     EYE SURGERY     cataract surgery   IMPLANTATION OF HYPOGLOSSAL NERVE STIMULATOR Right 04/23/2022   Procedure: IMPLANTATION OF HYPOGLOSSAL NERVE STIMULATOR;  Surgeon: Osborn Coho, MD;  Location:  SURGERY CENTER;  Service: ENT;  Laterality: Right;   KNEE ARTHROSCOPY  04/15/2012   Procedure: ARTHROSCOPY KNEE;  Surgeon: Loreta Ave, MD;  Location: [facility];  Service: Orthopedics;  Laterality: Right;   LAPAROSCOPIC CHOLECYSTECTOMY     LASIK     OOPHORECTOMY     PALATE TO GINGIVA GRAFT  2017   REVERSE SHOULDER ARTHROPLASTY Left 02/13/2021   Procedure: REVERSE SHOULDER ARTHROPLASTY;  Surgeon: Teryl Lucy, MD;  Location: [facility];  Service: Orthopedics;  Laterality: Left;   RIGHT/LEFT HEART CATH AND CORONARY ANGIOGRAPHY N/A 08/02/2016   Procedure: Right/Left Heart Cath and Coronary Angiography;  Surgeon: Tonny Bollman, MD;  Location: [facility] INVASIVE CV LAB;  Service: Cardiovascular;  Laterality: N/A;   ROTATOR CUFF REPAIR Left    SHOULDER ARTHROSCOPY WITH ROTATOR CUFF REPAIR AND  OUTPATIENT PHYSICAL THERAPY SHOULDER    Patient Name: [name] MRN: [id number] DOB:05-25-1951, 72 y.o., female Today's Date: 03/20/2023  END OF SESSION:  PT End of Session - 03/20/23 1435     Visit Number 12    Date for PT Re-Evaluation 05/05/23    PT Start Time 1440    PT Stop Time 1530    PT Time Calculation (min) 50 min            Past Medical History:  Diagnosis Date   Allergy    Anxiety    Arthritis    back & knee   Asthma     mild per pt shows up with resp illness   Colon polyps    Constipation    Diabetes mellitus without complication (HCC)    Gallstones    Gastric polyps    Gastroparesis    GERD (gastroesophageal reflux disease)    Headache    sinus headaches and migraines at times   Heart murmur    History of migraine headaches    HTN (hypertension)    Hyperlipidemia    IBS (irritable bowel syndrome)    Joint pain    Lumbar disc disease    PONV (postoperative nausea and vomiting)    S/P aortic valve replacement with bioprosthetic valve 08/23/2016   25 mm Edwards Intuity rapid-deployment bovine pericardial tissue valve via partial upper mini sternotomy   Sleep apnea    does not use CPAP   TIA (transient ischemic attack)    TIA (transient ischemic attack)    hx of per pt   Past Surgical History:  Procedure Laterality Date   ABDOMINAL HYSTERECTOMY     AORTIC VALVE REPLACEMENT N/A 08/23/2016   Procedure: AORTIC VALVE REPLACEMENT (AVR) - using partial Upper Sternotomy- 25mm Edwards Intuity Aortic Valve used;  Surgeon: Purcell Nails, MD;  Location: [facility];  Service: Open Heart Surgery;  Laterality: N/A;   BLADDER SUSPENSION  2007   BREAST ENHANCEMENT SURGERY  1980   CARPAL TUNNEL RELEASE Bilateral    COLONOSCOPY     DORSAL COMPARTMENT RELEASE Right 09/15/2014   Procedure: RIGHT WRIST DEQUERVAINS RELEASE ;  Surgeon: Mckinley Jewel, MD;  Location: [facility];  Service: Orthopedics;  Laterality: Right;   DRUG INDUCED ENDOSCOPY N/A  02/19/2022   Procedure: DRUG INDUCED SLEEP ENDOSCOPY;  Surgeon: Osborn Coho, MD;  Location: [facility];  Service: ENT;  Laterality: N/A;   ESOPHAGOGASTRODUODENOSCOPY     EYE SURGERY     cataract surgery   IMPLANTATION OF HYPOGLOSSAL NERVE STIMULATOR Right 04/23/2022   Procedure: IMPLANTATION OF HYPOGLOSSAL NERVE STIMULATOR;  Surgeon: Osborn Coho, MD;  Location:  SURGERY CENTER;  Service: ENT;  Laterality: Right;   KNEE ARTHROSCOPY  04/15/2012   Procedure: ARTHROSCOPY KNEE;  Surgeon: Loreta Ave, MD;  Location: [facility];  Service: Orthopedics;  Laterality: Right;   LAPAROSCOPIC CHOLECYSTECTOMY     LASIK     OOPHORECTOMY     PALATE TO GINGIVA GRAFT  2017   REVERSE SHOULDER ARTHROPLASTY Left 02/13/2021   Procedure: REVERSE SHOULDER ARTHROPLASTY;  Surgeon: Teryl Lucy, MD;  Location: [facility];  Service: Orthopedics;  Laterality: Left;   RIGHT/LEFT HEART CATH AND CORONARY ANGIOGRAPHY N/A 08/02/2016   Procedure: Right/Left Heart Cath and Coronary Angiography;  Surgeon: Tonny Bollman, MD;  Location: [facility] INVASIVE CV LAB;  Service: Cardiovascular;  Laterality: N/A;   ROTATOR CUFF REPAIR Left    SHOULDER ARTHROSCOPY WITH ROTATOR CUFF REPAIR AND

## 2023-03-25 ENCOUNTER — Encounter: Payer: Self-pay | Admitting: Pulmonary Disease

## 2023-03-26 ENCOUNTER — Encounter: Payer: Self-pay | Admitting: Physical Therapy

## 2023-03-26 ENCOUNTER — Ambulatory Visit: Payer: Medicare PPO | Attending: Orthopedic Surgery | Admitting: Physical Therapy

## 2023-03-26 DIAGNOSIS — G8918 Other acute postprocedural pain: Secondary | ICD-10-CM | POA: Diagnosis not present

## 2023-03-26 DIAGNOSIS — Z96612 Presence of left artificial shoulder joint: Secondary | ICD-10-CM

## 2023-03-26 DIAGNOSIS — M25611 Stiffness of right shoulder, not elsewhere classified: Secondary | ICD-10-CM

## 2023-03-26 DIAGNOSIS — M25511 Pain in right shoulder: Secondary | ICD-10-CM | POA: Insufficient documentation

## 2023-03-26 DIAGNOSIS — R600 Localized edema: Secondary | ICD-10-CM | POA: Insufficient documentation

## 2023-03-26 DIAGNOSIS — M6281 Muscle weakness (generalized): Secondary | ICD-10-CM | POA: Diagnosis not present

## 2023-03-26 NOTE — Therapy (Signed)
OUTPATIENT PHYSICAL THERAPY SHOULDER    Patient Name: [name] MRN: [id number] DOB:June 29, 1950, 72 y.o., female Today's Date: 03/26/2023  END OF SESSION:  PT End of Session - 03/26/23 1637     Visit Number 13    Date for PT Re-Evaluation 05/05/23    PT Start Time 1630    PT Stop Time 1710    PT Time Calculation (min) 40 min    Activity Tolerance Patient tolerated treatment well    Behavior During Therapy WFL for tasks assessed/performed             Past Medical History:  Diagnosis Date   Allergy    Anxiety    Arthritis    back & knee   Asthma     mild per pt shows up with resp illness   Colon polyps    Constipation    Diabetes mellitus without complication (HCC)    Gallstones    Gastric polyps    Gastroparesis    GERD (gastroesophageal reflux disease)    Headache    sinus headaches and migraines at times   Heart murmur    History of migraine headaches    HTN (hypertension)    Hyperlipidemia    IBS (irritable bowel syndrome)    Joint pain    Lumbar disc disease    PONV (postoperative nausea and vomiting)    S/P aortic valve replacement with bioprosthetic valve 08/23/2016   25 mm Edwards Intuity rapid-deployment bovine pericardial tissue valve via partial upper mini sternotomy   Sleep apnea    does not use CPAP   TIA (transient ischemic attack)    TIA (transient ischemic attack)    hx of per pt   Past Surgical History:  Procedure Laterality Date   ABDOMINAL HYSTERECTOMY     AORTIC VALVE REPLACEMENT N/A 08/23/2016   Procedure: AORTIC VALVE REPLACEMENT (AVR) - using partial Upper Sternotomy- 25mm Edwards Intuity Aortic Valve used;  Surgeon: Purcell Nails, MD;  Location: [facility];  Service: Open Heart Surgery;  Laterality: N/A;   BLADDER SUSPENSION  2007   BREAST ENHANCEMENT SURGERY  1980   CARPAL TUNNEL RELEASE Bilateral    COLONOSCOPY     DORSAL COMPARTMENT RELEASE Right 09/15/2014   Procedure: RIGHT WRIST DEQUERVAINS RELEASE ;  Surgeon: Mckinley Jewel, MD;  Location: [facility];  Service: Orthopedics;  Laterality: Right;   DRUG INDUCED ENDOSCOPY N/A 02/19/2022   Procedure: DRUG INDUCED SLEEP ENDOSCOPY;  Surgeon: Osborn Coho, MD;  Location: [facility];  Service: ENT;  Laterality: N/A;   ESOPHAGOGASTRODUODENOSCOPY     EYE SURGERY     cataract surgery   IMPLANTATION OF HYPOGLOSSAL NERVE STIMULATOR Right 04/23/2022   Procedure: IMPLANTATION OF HYPOGLOSSAL NERVE STIMULATOR;  Surgeon: Osborn Coho, MD;  Location: [facility];  Service: ENT;  Laterality: Right;   KNEE ARTHROSCOPY  04/15/2012   Procedure: ARTHROSCOPY KNEE;  Surgeon: Loreta Ave, MD;  Location: [facility] OR;  Service: Orthopedics;  Laterality: Right;   LAPAROSCOPIC CHOLECYSTECTOMY     LASIK     OOPHORECTOMY     PALATE TO GINGIVA GRAFT  2017   REVERSE SHOULDER ARTHROPLASTY Left 02/13/2021   Procedure: REVERSE SHOULDER ARTHROPLASTY;  Surgeon: Teryl Lucy, MD;  Location: [facility];  Service: Orthopedics;  Laterality: Left;   RIGHT/LEFT HEART CATH AND CORONARY ANGIOGRAPHY N/A 08/02/2016   Procedure: Right/Left Heart Cath and Coronary Angiography;  Surgeon: Tonny Bollman, MD;  Location: [facility] INVASIVE CV LAB;  Service:  function, but still limited in overhead activities. Ongoing  5.  Decrease R shoulder pain to < 2/10 with all activity. Baseline: 4/10 Goal status: 03/06/23 3/10  when stretching it. Ongoing  PLAN:  PT FREQUENCY: 1-2x/week  PT DURATION: 12 weeks  PLANNED INTERVENTIONS: Therapeutic exercises, Therapeutic activity, Neuromuscular re-education, Balance training, Gait training, Patient/Family education, Self Care, Joint mobilization, Dry Needling, Electrical stimulation, Cryotherapy, Moist heat, Vasopneumatic device, Ionotophoresis 4mg /ml Dexamethasone, and Manual therapy  PLAN FOR NEXT SESSION: deep STM and stretch, scap stabilization and shoulder strength.   Patient Details  Name: [name] MRN: [id number] Date of Birth: 02-02-1951 Referring Provider:  Shelva Majestic, MD  Encounter Date: 03/26/2023    Encounter Date: 03/26/2023   Iona Beard, DPT 03/26/2023, 5:08 PM  [facility] [address] [place], [place], [address] Phone: [phone]   Fax:  [phone]  function, but still limited in overhead activities. Ongoing  5.  Decrease R shoulder pain to < 2/10 with all activity. Baseline: 4/10 Goal status: 03/06/23 3/10  when stretching it. Ongoing  PLAN:  PT FREQUENCY: 1-2x/week  PT DURATION: 12 weeks  PLANNED INTERVENTIONS: Therapeutic exercises, Therapeutic activity, Neuromuscular re-education, Balance training, Gait training, Patient/Family education, Self Care, Joint mobilization, Dry Needling, Electrical stimulation, Cryotherapy, Moist heat, Vasopneumatic device, Ionotophoresis 4mg /ml Dexamethasone, and Manual therapy  PLAN FOR NEXT SESSION: deep STM and stretch, scap stabilization and shoulder strength.   Patient Details  Name: [name] MRN: [id number] Date of Birth: 02-02-1951 Referring Provider:  Shelva Majestic, MD  Encounter Date: 03/26/2023    Encounter Date: 03/26/2023   Iona Beard, DPT 03/26/2023, 5:08 PM  [facility] [address] [place], [place], [address] Phone: [phone]   Fax:  [phone]  function, but still limited in overhead activities. Ongoing  5.  Decrease R shoulder pain to < 2/10 with all activity. Baseline: 4/10 Goal status: 03/06/23 3/10  when stretching it. Ongoing  PLAN:  PT FREQUENCY: 1-2x/week  PT DURATION: 12 weeks  PLANNED INTERVENTIONS: Therapeutic exercises, Therapeutic activity, Neuromuscular re-education, Balance training, Gait training, Patient/Family education, Self Care, Joint mobilization, Dry Needling, Electrical stimulation, Cryotherapy, Moist heat, Vasopneumatic device, Ionotophoresis 4mg /ml Dexamethasone, and Manual therapy  PLAN FOR NEXT SESSION: deep STM and stretch, scap stabilization and shoulder strength.   Patient Details  Name: [name] MRN: [id number] Date of Birth: 02-02-1951 Referring Provider:  Shelva Majestic, MD  Encounter Date: 03/26/2023    Encounter Date: 03/26/2023   Iona Beard, DPT 03/26/2023, 5:08 PM  [facility] [address] [place], [place], [address] Phone: [phone]   Fax:  [phone]  OUTPATIENT PHYSICAL THERAPY SHOULDER    Patient Name: [name] MRN: [id number] DOB:June 29, 1950, 72 y.o., female Today's Date: 03/26/2023  END OF SESSION:  PT End of Session - 03/26/23 1637     Visit Number 13    Date for PT Re-Evaluation 05/05/23    PT Start Time 1630    PT Stop Time 1710    PT Time Calculation (min) 40 min    Activity Tolerance Patient tolerated treatment well    Behavior During Therapy WFL for tasks assessed/performed             Past Medical History:  Diagnosis Date   Allergy    Anxiety    Arthritis    back & knee   Asthma     mild per pt shows up with resp illness   Colon polyps    Constipation    Diabetes mellitus without complication (HCC)    Gallstones    Gastric polyps    Gastroparesis    GERD (gastroesophageal reflux disease)    Headache    sinus headaches and migraines at times   Heart murmur    History of migraine headaches    HTN (hypertension)    Hyperlipidemia    IBS (irritable bowel syndrome)    Joint pain    Lumbar disc disease    PONV (postoperative nausea and vomiting)    S/P aortic valve replacement with bioprosthetic valve 08/23/2016   25 mm Edwards Intuity rapid-deployment bovine pericardial tissue valve via partial upper mini sternotomy   Sleep apnea    does not use CPAP   TIA (transient ischemic attack)    TIA (transient ischemic attack)    hx of per pt   Past Surgical History:  Procedure Laterality Date   ABDOMINAL HYSTERECTOMY     AORTIC VALVE REPLACEMENT N/A 08/23/2016   Procedure: AORTIC VALVE REPLACEMENT (AVR) - using partial Upper Sternotomy- 25mm Edwards Intuity Aortic Valve used;  Surgeon: Purcell Nails, MD;  Location: [facility];  Service: Open Heart Surgery;  Laterality: N/A;   BLADDER SUSPENSION  2007   BREAST ENHANCEMENT SURGERY  1980   CARPAL TUNNEL RELEASE Bilateral    COLONOSCOPY     DORSAL COMPARTMENT RELEASE Right 09/15/2014   Procedure: RIGHT WRIST DEQUERVAINS RELEASE ;  Surgeon: Mckinley Jewel, MD;  Location: [facility];  Service: Orthopedics;  Laterality: Right;   DRUG INDUCED ENDOSCOPY N/A 02/19/2022   Procedure: DRUG INDUCED SLEEP ENDOSCOPY;  Surgeon: Osborn Coho, MD;  Location: [facility];  Service: ENT;  Laterality: N/A;   ESOPHAGOGASTRODUODENOSCOPY     EYE SURGERY     cataract surgery   IMPLANTATION OF HYPOGLOSSAL NERVE STIMULATOR Right 04/23/2022   Procedure: IMPLANTATION OF HYPOGLOSSAL NERVE STIMULATOR;  Surgeon: Osborn Coho, MD;  Location: [facility];  Service: ENT;  Laterality: Right;   KNEE ARTHROSCOPY  04/15/2012   Procedure: ARTHROSCOPY KNEE;  Surgeon: Loreta Ave, MD;  Location: [facility] OR;  Service: Orthopedics;  Laterality: Right;   LAPAROSCOPIC CHOLECYSTECTOMY     LASIK     OOPHORECTOMY     PALATE TO GINGIVA GRAFT  2017   REVERSE SHOULDER ARTHROPLASTY Left 02/13/2021   Procedure: REVERSE SHOULDER ARTHROPLASTY;  Surgeon: Teryl Lucy, MD;  Location: [facility];  Service: Orthopedics;  Laterality: Left;   RIGHT/LEFT HEART CATH AND CORONARY ANGIOGRAPHY N/A 08/02/2016   Procedure: Right/Left Heart Cath and Coronary Angiography;  Surgeon: Tonny Bollman, MD;  Location: [facility] INV[facility]IVE CV LAB;  Service:  function, but still limited in overhead activities. Ongoing  5.  Decrease R shoulder pain to < 2/10 with all activity. Baseline: 4/10 Goal status: 03/06/23 3/10  when stretching it. Ongoing  PLAN:  PT FREQUENCY: 1-2x/week  PT DURATION: 12 weeks  PLANNED INTERVENTIONS: Therapeutic exercises, Therapeutic activity, Neuromuscular re-education, Balance training, Gait training, Patient/Family education, Self Care, Joint mobilization, Dry Needling, Electrical stimulation, Cryotherapy, Moist heat, Vasopneumatic device, Ionotophoresis 4mg /ml Dexamethasone, and Manual therapy  PLAN FOR NEXT SESSION: deep STM and stretch, scap stabilization and shoulder strength.   Patient Details  Name: [name] MRN: [id number] Date of Birth: 02-02-1951 Referring Provider:  Shelva Majestic, MD  Encounter Date: 03/26/2023    Encounter Date: 03/26/2023   Iona Beard, DPT 03/26/2023, 5:08 PM  [facility] [address] [place], [place], [address] Phone: [phone]   Fax:  [phone]

## 2023-03-27 ENCOUNTER — Ambulatory Visit: Payer: Medicare PPO | Admitting: Physical Therapy

## 2023-03-28 ENCOUNTER — Ambulatory Visit: Payer: Medicare PPO | Admitting: Physical Therapy

## 2023-03-28 ENCOUNTER — Encounter: Payer: Self-pay | Admitting: Physical Therapy

## 2023-03-28 DIAGNOSIS — G8918 Other acute postprocedural pain: Secondary | ICD-10-CM | POA: Diagnosis not present

## 2023-03-28 DIAGNOSIS — Z96612 Presence of left artificial shoulder joint: Secondary | ICD-10-CM

## 2023-03-28 DIAGNOSIS — R600 Localized edema: Secondary | ICD-10-CM

## 2023-03-28 DIAGNOSIS — M25611 Stiffness of right shoulder, not elsewhere classified: Secondary | ICD-10-CM | POA: Diagnosis not present

## 2023-03-28 DIAGNOSIS — M25511 Pain in right shoulder: Secondary | ICD-10-CM | POA: Diagnosis not present

## 2023-03-28 DIAGNOSIS — M6281 Muscle weakness (generalized): Secondary | ICD-10-CM | POA: Diagnosis not present

## 2023-03-28 NOTE — Therapy (Signed)
Education details: POC Person educated: Patient Education method: Explanation Education comprehension: verbalized understanding  HOME EXERCISE PROGRAM: [id number]  ASSESSMENT:  CLINICAL IMPRESSION:  Patient reports that she is getting better, still difficulty getting to the back of the head to do her hair.  She does have popping with some activities and I tried to limit this with cues for smaller ROM and some scapular retraction  OBJECTIVE IMPAIRMENTS: decreased activity tolerance, decreased balance, decreased coordination, decreased ROM, decreased strength, impaired flexibility, impaired UE functional use, improper body mechanics, postural dysfunction, and pain.   ACTIVITY LIMITATIONS: carrying, lifting, sleeping, and reach over head  PARTICIPATION LIMITATIONS: meal prep, cleaning, laundry, driving, shopping, and community activity  PERSONAL FACTORS: Past/current experiences are also affecting patient's functional outcome.   REHAB POTENTIAL: Good  CLINICAL DECISION MAKING: Evolving/moderate complexity  EVALUATION COMPLEXITY: Moderate   GOALS: Goals reviewed with patient? Yes  SHORT TERM GOALS: Target date: 02/28/23  I with initial HEP Baseline: Goal status: 02/13/23 MET  LONG TERM GOALS: Target date: 05/05/23  I with final HEP Baseline:  Goal status: 03/10/23-ongoing  03/20/23 evolving  2.  Patient will recover at least 120 degrees of active R shoulder flexion and abduction Baseline: See ROM Goal status: 03/10/23 progressing  03/20/23 ongoing  3.  Increase RUE shoulder strength to at least 4/5 Baseline: 3-/5 Goal status: I9/23/24-flex 3+/5, Abd 3/5    4.  Patient will report the ability to perform all of her  normal daily activities with RU. Baseline: Severely limited functionally Goal status: 03/06/23-She is slowly returning to function, but still limited in overhead activities. Ongoing  5.  Decrease R shoulder pain to < 2/10 with all activity. Baseline: 4/10 Goal status: progressing 03/28/23  PLAN:  PT FREQUENCY: 1-2x/week  PT DURATION: 12 weeks  PLANNED INTERVENTIONS: Therapeutic exercises, Therapeutic activity, Neuromuscular re-education, Balance training, Gait training, Patient/Family education, Self Care, Joint mobilization, Dry Needling, Electrical stimulation, Cryotherapy, Moist heat, Vasopneumatic device, Ionotophoresis 4mg /ml Dexamethasone, and Manual therapy  PLAN FOR NEXT SESSION: continue to work on maximizing her functional strength and ROM   Patient Details  Name: [name] MRN: [id number] Date of Birth: 12/18/1950 Referring Provider:  Shelva Majestic, MD  Education details: POC Person educated: Patient Education method: Explanation Education comprehension: verbalized understanding  HOME EXERCISE PROGRAM: [id number]  ASSESSMENT:  CLINICAL IMPRESSION:  Patient reports that she is getting better, still difficulty getting to the back of the head to do her hair.  She does have popping with some activities and I tried to limit this with cues for smaller ROM and some scapular retraction  OBJECTIVE IMPAIRMENTS: decreased activity tolerance, decreased balance, decreased coordination, decreased ROM, decreased strength, impaired flexibility, impaired UE functional use, improper body mechanics, postural dysfunction, and pain.   ACTIVITY LIMITATIONS: carrying, lifting, sleeping, and reach over head  PARTICIPATION LIMITATIONS: meal prep, cleaning, laundry, driving, shopping, and community activity  PERSONAL FACTORS: Past/current experiences are also affecting patient's functional outcome.   REHAB POTENTIAL: Good  CLINICAL DECISION MAKING: Evolving/moderate complexity  EVALUATION COMPLEXITY: Moderate   GOALS: Goals reviewed with patient? Yes  SHORT TERM GOALS: Target date: 02/28/23  I with initial HEP Baseline: Goal status: 02/13/23 MET  LONG TERM GOALS: Target date: 05/05/23  I with final HEP Baseline:  Goal status: 03/10/23-ongoing  03/20/23 evolving  2.  Patient will recover at least 120 degrees of active R shoulder flexion and abduction Baseline: See ROM Goal status: 03/10/23 progressing  03/20/23 ongoing  3.  Increase RUE shoulder strength to at least 4/5 Baseline: 3-/5 Goal status: I9/23/24-flex 3+/5, Abd 3/5    4.  Patient will report the ability to perform all of her  normal daily activities with RU. Baseline: Severely limited functionally Goal status: 03/06/23-She is slowly returning to function, but still limited in overhead activities. Ongoing  5.  Decrease R shoulder pain to < 2/10 with all activity. Baseline: 4/10 Goal status: progressing 03/28/23  PLAN:  PT FREQUENCY: 1-2x/week  PT DURATION: 12 weeks  PLANNED INTERVENTIONS: Therapeutic exercises, Therapeutic activity, Neuromuscular re-education, Balance training, Gait training, Patient/Family education, Self Care, Joint mobilization, Dry Needling, Electrical stimulation, Cryotherapy, Moist heat, Vasopneumatic device, Ionotophoresis 4mg /ml Dexamethasone, and Manual therapy  PLAN FOR NEXT SESSION: continue to work on maximizing her functional strength and ROM   Patient Details  Name: [name] MRN: [id number] Date of Birth: 12/18/1950 Referring Provider:  Shelva Majestic, MD  OUTPATIENT PHYSICAL THERAPY SHOULDER    Patient Name: [name] MRN: [id number] DOB:Oct 28, 1950, 72 y.o., female Today's Date: 03/28/2023  END OF SESSION:  PT End of Session - 03/28/23 1006     Visit Number 14    Date for PT Re-Evaluation 05/05/23    PT Start Time 1006    PT Stop Time 1100    PT Time Calculation (min) 54 min    Activity Tolerance Patient tolerated treatment well    Behavior During Therapy WFL for tasks assessed/performed             Past Medical History:  Diagnosis Date   Allergy    Anxiety    Arthritis    back & knee   Asthma     mild per pt shows up with resp illness   Colon polyps    Constipation    Diabetes mellitus without complication (HCC)    Gallstones    Gastric polyps    Gastroparesis    GERD (gastroesophageal reflux disease)    Headache    sinus headaches and migraines at times   Heart murmur    History of migraine headaches    HTN (hypertension)    Hyperlipidemia    IBS (irritable bowel syndrome)    Joint pain    Lumbar disc disease    PONV (postoperative nausea and vomiting)    S/P aortic valve replacement with bioprosthetic valve 08/23/2016   25 mm Edwards Intuity rapid-deployment bovine pericardial tissue valve via partial upper mini sternotomy   Sleep apnea    does not use CPAP   TIA (transient ischemic attack)    TIA (transient ischemic attack)    hx of per pt   Past Surgical History:  Procedure Laterality Date   ABDOMINAL HYSTERECTOMY     AORTIC VALVE REPLACEMENT N/A 08/23/2016   Procedure: AORTIC VALVE REPLACEMENT (AVR) - using partial Upper Sternotomy- 25mm Edwards Intuity Aortic Valve used;  Surgeon: Purcell Nails, MD;  Location: [facility];  Service: Open Heart Surgery;  Laterality: N/A;   BLADDER SUSPENSION  2007   BREAST ENHANCEMENT SURGERY  1980   CARPAL TUNNEL RELEASE Bilateral    COLONOSCOPY     DORSAL COMPARTMENT RELEASE Right 09/15/2014   Procedure: RIGHT WRIST DEQUERVAINS RELEASE ;  Surgeon: Mckinley Jewel, MD;  Location: [facility];  Service: Orthopedics;  Laterality: Right;   DRUG INDUCED ENDOSCOPY N/A 02/19/2022   Procedure: DRUG INDUCED SLEEP ENDOSCOPY;  Surgeon: Osborn Coho, MD;  Location: [facility];  Service: ENT;  Laterality: N/A;   ESOPHAGOGASTRODUODENOSCOPY     EYE SURGERY     cataract surgery   IMPLANTATION OF HYPOGLOSSAL NERVE STIMULATOR Right 04/23/2022   Procedure: IMPLANTATION OF HYPOGLOSSAL NERVE STIMULATOR;  Surgeon: Osborn Coho, MD;  Location: [facility];  Service: ENT;  Laterality: Right;   KNEE ARTHROSCOPY  04/15/2012   Procedure: ARTHROSCOPY KNEE;  Surgeon: Loreta Ave, MD;  Location: [facility] OR;  Service: Orthopedics;  Laterality: Right;   LAPAROSCOPIC CHOLECYSTECTOMY     LASIK     OOPHORECTOMY     PALATE TO GINGIVA GRAFT  2017   REVERSE SHOULDER ARTHROPLASTY Left 02/13/2021   Procedure: REVERSE SHOULDER ARTHROPLASTY;  Surgeon: Teryl Lucy, MD;  Location: [facility];  Service: Orthopedics;  Laterality: Left;   RIGHT/LEFT HEART CATH AND CORONARY ANGIOGRAPHY N/A 08/02/2016   Procedure: Right/Left Heart Cath and Coronary Angiography;  Surgeon: Tonny Bollman, MD;  Location: [facility];  Service:  Education details: POC Person educated: Patient Education method: Explanation Education comprehension: verbalized understanding  HOME EXERCISE PROGRAM: [id number]  ASSESSMENT:  CLINICAL IMPRESSION:  Patient reports that she is getting better, still difficulty getting to the back of the head to do her hair.  She does have popping with some activities and I tried to limit this with cues for smaller ROM and some scapular retraction  OBJECTIVE IMPAIRMENTS: decreased activity tolerance, decreased balance, decreased coordination, decreased ROM, decreased strength, impaired flexibility, impaired UE functional use, improper body mechanics, postural dysfunction, and pain.   ACTIVITY LIMITATIONS: carrying, lifting, sleeping, and reach over head  PARTICIPATION LIMITATIONS: meal prep, cleaning, laundry, driving, shopping, and community activity  PERSONAL FACTORS: Past/current experiences are also affecting patient's functional outcome.   REHAB POTENTIAL: Good  CLINICAL DECISION MAKING: Evolving/moderate complexity  EVALUATION COMPLEXITY: Moderate   GOALS: Goals reviewed with patient? Yes  SHORT TERM GOALS: Target date: 02/28/23  I with initial HEP Baseline: Goal status: 02/13/23 MET  LONG TERM GOALS: Target date: 05/05/23  I with final HEP Baseline:  Goal status: 03/10/23-ongoing  03/20/23 evolving  2.  Patient will recover at least 120 degrees of active R shoulder flexion and abduction Baseline: See ROM Goal status: 03/10/23 progressing  03/20/23 ongoing  3.  Increase RUE shoulder strength to at least 4/5 Baseline: 3-/5 Goal status: I9/23/24-flex 3+/5, Abd 3/5    4.  Patient will report the ability to perform all of her  normal daily activities with RU. Baseline: Severely limited functionally Goal status: 03/06/23-She is slowly returning to function, but still limited in overhead activities. Ongoing  5.  Decrease R shoulder pain to < 2/10 with all activity. Baseline: 4/10 Goal status: progressing 03/28/23  PLAN:  PT FREQUENCY: 1-2x/week  PT DURATION: 12 weeks  PLANNED INTERVENTIONS: Therapeutic exercises, Therapeutic activity, Neuromuscular re-education, Balance training, Gait training, Patient/Family education, Self Care, Joint mobilization, Dry Needling, Electrical stimulation, Cryotherapy, Moist heat, Vasopneumatic device, Ionotophoresis 4mg /ml Dexamethasone, and Manual therapy  PLAN FOR NEXT SESSION: continue to work on maximizing her functional strength and ROM   Patient Details  Name: [name] MRN: [id number] Date of Birth: 12/18/1950 Referring Provider:  Shelva Majestic, MD  OUTPATIENT PHYSICAL THERAPY SHOULDER    Patient Name: [name] MRN: [id number] DOB:Oct 28, 1950, 72 y.o., female Today's Date: 03/28/2023  END OF SESSION:  PT End of Session - 03/28/23 1006     Visit Number 14    Date for PT Re-Evaluation 05/05/23    PT Start Time 1006    PT Stop Time 1100    PT Time Calculation (min) 54 min    Activity Tolerance Patient tolerated treatment well    Behavior During Therapy WFL for tasks assessed/performed             Past Medical History:  Diagnosis Date   Allergy    Anxiety    Arthritis    back & knee   Asthma     mild per pt shows up with resp illness   Colon polyps    Constipation    Diabetes mellitus without complication (HCC)    Gallstones    Gastric polyps    Gastroparesis    GERD (gastroesophageal reflux disease)    Headache    sinus headaches and migraines at times   Heart murmur    History of migraine headaches    HTN (hypertension)    Hyperlipidemia    IBS (irritable bowel syndrome)    Joint pain    Lumbar disc disease    PONV (postoperative nausea and vomiting)    S/P aortic valve replacement with bioprosthetic valve 08/23/2016   25 mm Edwards Intuity rapid-deployment bovine pericardial tissue valve via partial upper mini sternotomy   Sleep apnea    does not use CPAP   TIA (transient ischemic attack)    TIA (transient ischemic attack)    hx of per pt   Past Surgical History:  Procedure Laterality Date   ABDOMINAL HYSTERECTOMY     AORTIC VALVE REPLACEMENT N/A 08/23/2016   Procedure: AORTIC VALVE REPLACEMENT (AVR) - using partial Upper Sternotomy- 25mm Edwards Intuity Aortic Valve used;  Surgeon: Purcell Nails, MD;  Location: [facility];  Service: Open Heart Surgery;  Laterality: N/A;   BLADDER SUSPENSION  2007   BREAST ENHANCEMENT SURGERY  1980   CARPAL TUNNEL RELEASE Bilateral    COLONOSCOPY     DORSAL COMPARTMENT RELEASE Right 09/15/2014   Procedure: RIGHT WRIST DEQUERVAINS RELEASE ;  Surgeon: Mckinley Jewel, MD;  Location: [facility];  Service: Orthopedics;  Laterality: Right;   DRUG INDUCED ENDOSCOPY N/A 02/19/2022   Procedure: DRUG INDUCED SLEEP ENDOSCOPY;  Surgeon: Osborn Coho, MD;  Location: [facility];  Service: ENT;  Laterality: N/A;   ESOPHAGOGASTRODUODENOSCOPY     EYE SURGERY     cataract surgery   IMPLANTATION OF HYPOGLOSSAL NERVE STIMULATOR Right 04/23/2022   Procedure: IMPLANTATION OF HYPOGLOSSAL NERVE STIMULATOR;  Surgeon: Osborn Coho, MD;  Location: [facility];  Service: ENT;  Laterality: Right;   KNEE ARTHROSCOPY  04/15/2012   Procedure: ARTHROSCOPY KNEE;  Surgeon: Loreta Ave, MD;  Location: [facility] OR;  Service: Orthopedics;  Laterality: Right;   LAPAROSCOPIC CHOLECYSTECTOMY     LASIK     OOPHORECTOMY     PALATE TO GINGIVA GRAFT  2017   REVERSE SHOULDER ARTHROPLASTY Left 02/13/2021   Procedure: REVERSE SHOULDER ARTHROPLASTY;  Surgeon: Teryl Lucy, MD;  Location: [facility];  Service: Orthopedics;  Laterality: Left;   RIGHT/LEFT HEART CATH AND CORONARY ANGIOGRAPHY N/A 08/02/2016   Procedure: Right/Left Heart Cath and Coronary Angiography;  Surgeon: Tonny Bollman, MD;  Location: [facility];  Service:

## 2023-03-31 ENCOUNTER — Ambulatory Visit: Payer: Medicare PPO | Admitting: Physical Therapy

## 2023-03-31 ENCOUNTER — Encounter: Payer: Self-pay | Admitting: Physical Therapy

## 2023-03-31 DIAGNOSIS — M25611 Stiffness of right shoulder, not elsewhere classified: Secondary | ICD-10-CM | POA: Diagnosis not present

## 2023-03-31 DIAGNOSIS — M6281 Muscle weakness (generalized): Secondary | ICD-10-CM

## 2023-03-31 DIAGNOSIS — M25511 Pain in right shoulder: Secondary | ICD-10-CM | POA: Diagnosis not present

## 2023-03-31 DIAGNOSIS — Z96612 Presence of left artificial shoulder joint: Secondary | ICD-10-CM

## 2023-03-31 DIAGNOSIS — R600 Localized edema: Secondary | ICD-10-CM

## 2023-03-31 DIAGNOSIS — G8918 Other acute postprocedural pain: Secondary | ICD-10-CM

## 2023-03-31 NOTE — Therapy (Signed)
NEXT SESSION: continue to work on maximizing her functional strength and ROM   Patient Details  Name: [name] MRN: [id number] Date of Birth: 1951/04/15 Referring Provider:  Shelva Majestic, MD  NEXT SESSION: continue to work on maximizing her functional strength and ROM   Patient Details  Name: [name] MRN: [id number] Date of Birth: 1951/04/15 Referring Provider:  Shelva Majestic, MD  OUTPATIENT PHYSICAL THERAPY SHOULDER    Patient Name: [name] MRN: [id number] DOB:1950-12-01, 72 y.o., female Today's Date: 03/31/2023  END OF SESSION:  Past Medical History:  Diagnosis Date   Allergy    Anxiety    Arthritis    back & knee   Asthma     mild per pt shows up with resp illness   Colon polyps    Constipation    Diabetes mellitus without complication (HCC)    Gallstones    Gastric polyps    Gastroparesis    GERD (gastroesophageal reflux disease)    Headache    sinus headaches and migraines at times   Heart murmur    History of migraine headaches    HTN (hypertension)    Hyperlipidemia    IBS (irritable bowel syndrome)    Joint pain    Lumbar disc disease    PONV (postoperative nausea and vomiting)    S/P aortic valve replacement with bioprosthetic valve 08/23/2016   25 mm Edwards Intuity rapid-deployment bovine pericardial tissue valve via partial upper mini sternotomy   Sleep apnea    does not use CPAP   TIA (transient ischemic attack)    TIA (transient ischemic attack)    hx of per pt   Past Surgical History:  Procedure Laterality Date   ABDOMINAL HYSTERECTOMY     AORTIC VALVE REPLACEMENT N/A 08/23/2016   Procedure: AORTIC VALVE REPLACEMENT (AVR) - using partial Upper Sternotomy- 25mm Edwards Intuity Aortic Valve used;  Surgeon: Purcell Nails, MD;  Location: [facility];  Service: Open Heart Surgery;  Laterality: N/A;   BLADDER SUSPENSION  2007   BREAST ENHANCEMENT SURGERY  1980   CARPAL TUNNEL RELEASE Bilateral    COLONOSCOPY     DORSAL COMPARTMENT RELEASE Right 09/15/2014   Procedure: RIGHT WRIST DEQUERVAINS RELEASE ;  Surgeon: Mckinley Jewel, MD;  Location: [facility];  Service: Orthopedics;  Laterality: Right;   DRUG INDUCED ENDOSCOPY N/A 02/19/2022   Procedure: DRUG INDUCED SLEEP ENDOSCOPY;  Surgeon: Osborn Coho, MD;  Location: [facility];  Service: ENT;  Laterality: N/A;   ESOPHAGOGASTRODUODENOSCOPY      EYE SURGERY     cataract surgery   IMPLANTATION OF HYPOGLOSSAL NERVE STIMULATOR Right 04/23/2022   Procedure: IMPLANTATION OF HYPOGLOSSAL NERVE STIMULATOR;  Surgeon: Osborn Coho, MD;  Location: [facility];  Service: ENT;  Laterality: Right;   KNEE ARTHROSCOPY  04/15/2012   Procedure: ARTHROSCOPY KNEE;  Surgeon: Loreta Ave, MD;  Location: [facility] OR;  Service: Orthopedics;  Laterality: Right;   LAPAROSCOPIC CHOLECYSTECTOMY     LASIK     OOPHORECTOMY     PALATE TO GINGIVA GRAFT  2017   REVERSE SHOULDER ARTHROPLASTY Left 02/13/2021   Procedure: REVERSE SHOULDER ARTHROPLASTY;  Surgeon: Teryl Lucy, MD;  Location: [facility];  Service: Orthopedics;  Laterality: Left;   RIGHT/LEFT HEART CATH AND CORONARY ANGIOGRAPHY N/A 08/02/2016   Procedure: Right/Left Heart Cath and Coronary Angiography;  Surgeon: Tonny Bollman, MD;  Location: [facility];  Service: Cardiovascular;  Laterality: N/A;   ROTATOR CUFF REPAIR Left    SHOULDER ARTHROSCOPY WITH ROTATOR CUFF REPAIR AND SUBACROMIAL DECOMPRESSION Right 12/24/2022   Procedure: SHOULDER ARTHROSCOPY WITH DEBRIDEMENT, ROTATOR CUFF REPAIR AND SUBACROMIAL DECOMPRESSION;  Surgeon: Teryl Lucy, MD;  Location: [facility];  Service: Orthopedics;  Laterality: Right;   TEE WITHOUT CARDIOVERSION N/A 08/23/2016   Procedure: TRANSESOPHAGEAL ECHOCARDIOGRAM (TEE);  Surgeon: Purcell Nails, MD;  Location: [facility]  NEXT SESSION: continue to work on maximizing her functional strength and ROM   Patient Details  Name: [name] MRN: [id number] Date of Birth: 1951/04/15 Referring Provider:  Shelva Majestic, MD  OUTPATIENT PHYSICAL THERAPY SHOULDER    Patient Name: [name] MRN: [id number] DOB:1950-12-01, 72 y.o., female Today's Date: 03/31/2023  END OF SESSION:  Past Medical History:  Diagnosis Date   Allergy    Anxiety    Arthritis    back & knee   Asthma     mild per pt shows up with resp illness   Colon polyps    Constipation    Diabetes mellitus without complication (HCC)    Gallstones    Gastric polyps    Gastroparesis    GERD (gastroesophageal reflux disease)    Headache    sinus headaches and migraines at times   Heart murmur    History of migraine headaches    HTN (hypertension)    Hyperlipidemia    IBS (irritable bowel syndrome)    Joint pain    Lumbar disc disease    PONV (postoperative nausea and vomiting)    S/P aortic valve replacement with bioprosthetic valve 08/23/2016   25 mm Edwards Intuity rapid-deployment bovine pericardial tissue valve via partial upper mini sternotomy   Sleep apnea    does not use CPAP   TIA (transient ischemic attack)    TIA (transient ischemic attack)    hx of per pt   Past Surgical History:  Procedure Laterality Date   ABDOMINAL HYSTERECTOMY     AORTIC VALVE REPLACEMENT N/A 08/23/2016   Procedure: AORTIC VALVE REPLACEMENT (AVR) - using partial Upper Sternotomy- 25mm Edwards Intuity Aortic Valve used;  Surgeon: Purcell Nails, MD;  Location: [facility];  Service: Open Heart Surgery;  Laterality: N/A;   BLADDER SUSPENSION  2007   BREAST ENHANCEMENT SURGERY  1980   CARPAL TUNNEL RELEASE Bilateral    COLONOSCOPY     DORSAL COMPARTMENT RELEASE Right 09/15/2014   Procedure: RIGHT WRIST DEQUERVAINS RELEASE ;  Surgeon: Mckinley Jewel, MD;  Location: [facility];  Service: Orthopedics;  Laterality: Right;   DRUG INDUCED ENDOSCOPY N/A 02/19/2022   Procedure: DRUG INDUCED SLEEP ENDOSCOPY;  Surgeon: Osborn Coho, MD;  Location: [facility];  Service: ENT;  Laterality: N/A;   ESOPHAGOGASTRODUODENOSCOPY      EYE SURGERY     cataract surgery   IMPLANTATION OF HYPOGLOSSAL NERVE STIMULATOR Right 04/23/2022   Procedure: IMPLANTATION OF HYPOGLOSSAL NERVE STIMULATOR;  Surgeon: Osborn Coho, MD;  Location: [facility];  Service: ENT;  Laterality: Right;   KNEE ARTHROSCOPY  04/15/2012   Procedure: ARTHROSCOPY KNEE;  Surgeon: Loreta Ave, MD;  Location: [facility] OR;  Service: Orthopedics;  Laterality: Right;   LAPAROSCOPIC CHOLECYSTECTOMY     LASIK     OOPHORECTOMY     PALATE TO GINGIVA GRAFT  2017   REVERSE SHOULDER ARTHROPLASTY Left 02/13/2021   Procedure: REVERSE SHOULDER ARTHROPLASTY;  Surgeon: Teryl Lucy, MD;  Location: [facility];  Service: Orthopedics;  Laterality: Left;   RIGHT/LEFT HEART CATH AND CORONARY ANGIOGRAPHY N/A 08/02/2016   Procedure: Right/Left Heart Cath and Coronary Angiography;  Surgeon: Tonny Bollman, MD;  Location: [facility];  Service: Cardiovascular;  Laterality: N/A;   ROTATOR CUFF REPAIR Left    SHOULDER ARTHROSCOPY WITH ROTATOR CUFF REPAIR AND SUBACROMIAL DECOMPRESSION Right 12/24/2022   Procedure: SHOULDER ARTHROSCOPY WITH DEBRIDEMENT, ROTATOR CUFF REPAIR AND SUBACROMIAL DECOMPRESSION;  Surgeon: Teryl Lucy, MD;  Location: [facility];  Service: Orthopedics;  Laterality: Right;   TEE WITHOUT CARDIOVERSION N/A 08/23/2016   Procedure: TRANSESOPHAGEAL ECHOCARDIOGRAM (TEE);  Surgeon: Purcell Nails, MD;  Location: [facility]

## 2023-04-01 ENCOUNTER — Encounter (INDEPENDENT_AMBULATORY_CARE_PROVIDER_SITE_OTHER): Payer: Self-pay | Admitting: Adult Health

## 2023-04-01 ENCOUNTER — Ambulatory Visit (INDEPENDENT_AMBULATORY_CARE_PROVIDER_SITE_OTHER): Payer: Medicare PPO | Admitting: Adult Health

## 2023-04-01 VITALS — BP 134/76 | HR 65 | Temp 98.3°F | Ht 66.0 in | Wt 205.0 lb

## 2023-04-01 DIAGNOSIS — M7541 Impingement syndrome of right shoulder: Secondary | ICD-10-CM | POA: Diagnosis not present

## 2023-04-01 DIAGNOSIS — Z7984 Long term (current) use of oral hypoglycemic drugs: Secondary | ICD-10-CM | POA: Diagnosis not present

## 2023-04-01 DIAGNOSIS — Z6833 Body mass index (BMI) 33.0-33.9, adult: Secondary | ICD-10-CM

## 2023-04-01 DIAGNOSIS — E669 Obesity, unspecified: Secondary | ICD-10-CM

## 2023-04-01 DIAGNOSIS — E66812 Obesity, class 2: Secondary | ICD-10-CM

## 2023-04-01 DIAGNOSIS — E119 Type 2 diabetes mellitus without complications: Secondary | ICD-10-CM | POA: Diagnosis not present

## 2023-04-01 NOTE — Progress Notes (Signed)
WEIGHT SUMMARY AND BIOMETRICS  Vitals Temp: 98.3 F (36.8 C) BP: 134/76 Pulse Rate: 65 SpO2: 99 %   Anthropometric Measurements Height: 5\' 6"  (1.676 m) Weight: 205 lb (93 kg) BMI (Calculated): 33.1 Weight at Last Visit: 194lb Weight Lost Since Last Visit: 0 Weight Gained Since Last Visit: 11lb Starting Weight: 220lb Total Weight Loss (lbs): 15 lb (6.804 kg)   Body Composition  Body Fat %: 48.1 % Fat Mass (lbs): 98.8 lbs Muscle Mass (lbs): 101.2 lbs Total Body Water (lbs): 76.6 lbs Visceral Fat Rating : 14   Other Clinical Data Fasting: no Labs: no Today's Visit #: 109 Starting Date: 01/19/18    Chief Complaint:   OBESITY [name] is here to discuss her progress with her obesity treatment plan. She is on the the Category 2 Plan and states she is following her eating plan approximately 40 % of the time. She states she is exercising Out Patient PT 45 mins 2 x week, Home Exercises 15 mins 5 x week   Interim History:  Last in office appt at [facility] was on 10/08/2022- weight 194 lbs with corresponding BMI 31.33 Visceral Adipose Rating 13  12/24/2022 Right SHOULDER ARTHROSCOPY WITH DEBRIDEMENT, ROTATOR CUFF REPAIR AND SUBACROMIAL DECOMPRESSION   04/01/2023 Weight 205 lbs with corresponding BMI 33.1  Exercise-Out Patient PT 45 mins 2 x week, Home Exercises 15 mins 5 x week  Subjective:   1. Shoulder impingement syndrome, right 12/24/2022 SHOULDER ARTHROSCOPY WITH DEBRIDEMENT, ROTATOR CUFF REPAIR AND SUBACROMIAL DECOMPRESSION  She was R arm sling for 2 months post operatively - which limited her ability to eat foods on plan. She states "I could only really eat finger food" She is now sling-free and in bi-weekly Out Patient PT- 45 mins each session. She will perform home exercises 5 x week.  2. Type 2 diabetes mellitus without complication, unspecified whether long term insulin use (HCC) PCP has been titrating Metformin therapy down as she has been losing  weight. She is currently on Metformin 500mg  QD. She denies recent episodes of hypoglycemia  Assessment/Plan:   1. Shoulder impingement syndrome, right Increase protein intake and continue exercises (PT and home)  2. Type 2 diabetes mellitus without complication, unspecified whether long term insulin use (HCC) Continue Metformin therapy per PCP  3. Obesity, current BMI 33.2  [name] is currently in the action stage of change. As such, her goal is to continue with weight loss efforts. She has agreed to the Category 2 Plan.   Handouts: Cat 2 Meal Plan with Grocery Lists 100/200 Snack Lists Eating Out Guide   Exercise goals: Older adults should follow the adult guidelines. When older adults cannot meet the adult guidelines, they should be as physically active as their abilities and conditions will allow.  Older adults should do exercises that maintain or improve balance if they are at risk of falling.  Older adults should determine their level of effort for physical activity relative to their level of fitness.  Older adults with chronic conditions should understand whether and how their conditions affect their ability to do regular physical activity safely.  Behavioral modification strategies: increasing lean protein intake, decreasing simple carbohydrates, increasing vegetables, increasing water intake, decreasing eating out, no skipping meals, meal planning and cooking strategies, keeping healthy foods in the home, ways to avoid boredom eating, ways to avoid night time snacking, better snacking choices, and planning for success.  Tennyson has agreed to follow-up with our clinic in 4 weeks. She was informed  of the importance of frequent follow-up visits to maximize her success with intensive lifestyle modifications for her multiple health conditions.   Objective:   Blood pressure 134/76, pulse 65, temperature 98.3 F (36.8 C), height 5\' 6"  (1.676 m), weight 205 lb (93 kg), SpO2  99%. Body mass index is 33.09 kg/m.  General: Cooperative, alert, well developed, in no acute distress. HEENT: Conjunctivae and lids unremarkable. Cardiovascular: Regular rhythm.  Lungs: Normal work of breathing. Neurologic: No focal deficits.   Lab Results  Component Value Date   CREATININE 0.53 12/19/2022   BUN 12 12/19/2022   NA 129 (L) 12/19/2022   K 4.1 12/19/2022   CL 98 12/19/2022   CO2 23 12/19/2022   Lab Results  Component Value Date   ALT 13 10/03/2022   AST 26 10/03/2022   ALKPHOS 119 (H) 10/03/2022   BILITOT 0.4 10/03/2022   Lab Results  Component Value Date   HGBA1C 6.3 10/03/2022   HGBA1C 6.3 (H) 03/05/2022   HGBA1C 6.3 09/24/2021   HGBA1C 6.6 (H) 06/05/2021   HGBA1C 6.5 (H) 02/09/2021   Lab Results  Component Value Date   INSULIN 6.3 03/05/2022   INSULIN 12.9 06/05/2021   INSULIN 7.7 01/25/2021   INSULIN 13.1 04/20/2020   INSULIN 12.1 01/19/2018   Lab Results  Component Value Date   TSH 4.23 09/17/2021   Lab Results  Component Value Date   CHOL 147 10/03/2022   HDL 72.70 10/03/2022   LDLCALC 61 10/03/2022   LDLDIRECT 67.0 04/03/2022   TRIG 68.0 10/03/2022   CHOLHDL 2 10/03/2022   Lab Results  Component Value Date   VD25OH 62.2 03/05/2022   VD25OH 70.1 06/05/2021   VD25OH 74.5 01/25/2021   Lab Results  Component Value Date   WBC 11.6 (H) 10/31/2022   HGB 11.0 (L) 10/31/2022   HCT 35.0 (L) 10/31/2022   MCV 78.9 10/31/2022   PLT 386.0 10/31/2022   Lab Results  Component Value Date   IRON 48 06/12/2022   TIBC 450.8 (H) 06/12/2022   FERRITIN 12.0 06/12/2022   Attestation Statements:   Reviewed by clinician on day of visit: allergies, medications, problem list, medical history, surgical history, family history, social history, and previous encounter notes.  Time spent on visit including pre-visit chart review and post-visit care and charting was 28 minutes.   I have reviewed the above documentation for accuracy and  completeness, and I agree with the above. -  Izmael Duross d. Tracker Mance, NP-C

## 2023-04-03 ENCOUNTER — Encounter: Payer: Self-pay | Admitting: Physical Therapy

## 2023-04-03 ENCOUNTER — Ambulatory Visit: Payer: Medicare PPO | Admitting: Physical Therapy

## 2023-04-03 DIAGNOSIS — M6281 Muscle weakness (generalized): Secondary | ICD-10-CM

## 2023-04-03 DIAGNOSIS — M25611 Stiffness of right shoulder, not elsewhere classified: Secondary | ICD-10-CM

## 2023-04-03 DIAGNOSIS — R600 Localized edema: Secondary | ICD-10-CM

## 2023-04-03 DIAGNOSIS — Z96612 Presence of left artificial shoulder joint: Secondary | ICD-10-CM

## 2023-04-03 DIAGNOSIS — G8918 Other acute postprocedural pain: Secondary | ICD-10-CM | POA: Diagnosis not present

## 2023-04-03 DIAGNOSIS — M25511 Pain in right shoulder: Secondary | ICD-10-CM | POA: Diagnosis not present

## 2023-04-03 NOTE — Therapy (Signed)
I with initial HEP Baseline: Goal status: 02/13/23 MET  LONG TERM GOALS: Target date: 05/05/23  I with final HEP Baseline:  Goal status: 03/10/23-ongoing  03/20/23 evolving  2.  Patient will recover at least 120 degrees of active R shoulder flexion and abduction Baseline: See ROM Goal status: 03/10/23 progressing  03/20/23 ongoing  3.  Increase RUE shoulder strength to at least 4/5 Baseline: 3-/5 Goal status: 03/17/23-flex 3+/5, Abd 3/5    4.  Patient will report the ability to perform all of her normal daily activities with RU. Baseline: Severely limited functionally Goal status: 03/31/23-Unable to reach the back of her head, ongoing  5.  Decrease R shoulder pain to < 2/10 with all activity. Baseline: 4/10 Goal status: progressing 03/28/23  PLAN:  PT FREQUENCY: 1-2x/week  PT DURATION: 12 weeks  PLANNED INTERVENTIONS: Therapeutic exercises, Therapeutic activity, Neuromuscular re-education, Balance training, Gait training, Patient/Family education, Self Care, Joint mobilization, Dry Needling, Electrical stimulation, Cryotherapy, Moist heat, Vasopneumatic device, Ionotophoresis 4mg /ml Dexamethasone, and Manual therapy  PLAN FOR NEXT SESSION: continue to work on maximizing her functional strength and ROM   Patient Details  Name: [name] MRN: [id number] Date of Birth: 07/30/1950 Referring Provider:  Shelva Majestic,  MD  Oley Balm, DPT  I with initial HEP Baseline: Goal status: 02/13/23 MET  LONG TERM GOALS: Target date: 05/05/23  I with final HEP Baseline:  Goal status: 03/10/23-ongoing  03/20/23 evolving  2.  Patient will recover at least 120 degrees of active R shoulder flexion and abduction Baseline: See ROM Goal status: 03/10/23 progressing  03/20/23 ongoing  3.  Increase RUE shoulder strength to at least 4/5 Baseline: 3-/5 Goal status: 03/17/23-flex 3+/5, Abd 3/5    4.  Patient will report the ability to perform all of her normal daily activities with RU. Baseline: Severely limited functionally Goal status: 03/31/23-Unable to reach the back of her head, ongoing  5.  Decrease R shoulder pain to < 2/10 with all activity. Baseline: 4/10 Goal status: progressing 03/28/23  PLAN:  PT FREQUENCY: 1-2x/week  PT DURATION: 12 weeks  PLANNED INTERVENTIONS: Therapeutic exercises, Therapeutic activity, Neuromuscular re-education, Balance training, Gait training, Patient/Family education, Self Care, Joint mobilization, Dry Needling, Electrical stimulation, Cryotherapy, Moist heat, Vasopneumatic device, Ionotophoresis 4mg /ml Dexamethasone, and Manual therapy  PLAN FOR NEXT SESSION: continue to work on maximizing her functional strength and ROM   Patient Details  Name: [name] MRN: [id number] Date of Birth: 07/30/1950 Referring Provider:  Shelva Majestic,  MD  Oley Balm, DPT  I with initial HEP Baseline: Goal status: 02/13/23 MET  LONG TERM GOALS: Target date: 05/05/23  I with final HEP Baseline:  Goal status: 03/10/23-ongoing  03/20/23 evolving  2.  Patient will recover at least 120 degrees of active R shoulder flexion and abduction Baseline: See ROM Goal status: 03/10/23 progressing  03/20/23 ongoing  3.  Increase RUE shoulder strength to at least 4/5 Baseline: 3-/5 Goal status: 03/17/23-flex 3+/5, Abd 3/5    4.  Patient will report the ability to perform all of her normal daily activities with RU. Baseline: Severely limited functionally Goal status: 03/31/23-Unable to reach the back of her head, ongoing  5.  Decrease R shoulder pain to < 2/10 with all activity. Baseline: 4/10 Goal status: progressing 03/28/23  PLAN:  PT FREQUENCY: 1-2x/week  PT DURATION: 12 weeks  PLANNED INTERVENTIONS: Therapeutic exercises, Therapeutic activity, Neuromuscular re-education, Balance training, Gait training, Patient/Family education, Self Care, Joint mobilization, Dry Needling, Electrical stimulation, Cryotherapy, Moist heat, Vasopneumatic device, Ionotophoresis 4mg /ml Dexamethasone, and Manual therapy  PLAN FOR NEXT SESSION: continue to work on maximizing her functional strength and ROM   Patient Details  Name: [name] MRN: [id number] Date of Birth: 07/30/1950 Referring Provider:  Shelva Majestic,  MD  Oley Balm, DPT  OUTPATIENT PHYSICAL THERAPY SHOULDER    Patient Name: [name] MRN: [id number] DOB:December 20, 1950, 72 y.o., female Today's Date: 04/03/2023  END OF SESSION:  PT End of Session - 04/03/23 1149     Visit Number 16    Date for PT Re-Evaluation 05/05/23    PT Start Time 1142    PT Stop Time 1223    PT Time Calculation (min) 41 min    Activity Tolerance Patient tolerated treatment well    Behavior During Therapy WFL for tasks assessed/performed            Past Medical History:  Diagnosis Date   Allergy    Anxiety    Arthritis    back & knee   Asthma     mild per pt shows up with resp illness   Colon polyps    Constipation    Diabetes mellitus without complication (HCC)    Gallstones    Gastric polyps    Gastroparesis    GERD (gastroesophageal reflux disease)    Headache    sinus headaches and migraines at times   Heart murmur    History of migraine headaches    HTN (hypertension)    Hyperlipidemia    IBS (irritable bowel syndrome)    Joint pain    Lumbar disc disease    PONV (postoperative nausea and vomiting)    S/P aortic valve replacement with bioprosthetic valve 08/23/2016   25 mm Edwards Intuity rapid-deployment bovine pericardial tissue valve via partial upper mini sternotomy   Sleep apnea    does not use CPAP   TIA (transient ischemic attack)    TIA (transient ischemic attack)    hx of per pt   Past Surgical History:  Procedure Laterality Date   ABDOMINAL HYSTERECTOMY     AORTIC VALVE REPLACEMENT N/A 08/23/2016   Procedure: AORTIC VALVE REPLACEMENT (AVR) - using partial Upper Sternotomy- 25mm Edwards Intuity Aortic Valve used;  Surgeon: Purcell Nails, MD;  Location: [facility] OR;  Service: Open Heart Surgery;  Laterality: N/A;   BLADDER SUSPENSION  2007   BREAST ENHANCEMENT SURGERY  1980   CARPAL TUNNEL RELEASE Bilateral    COLONOSCOPY     DORSAL COMPARTMENT RELEASE Right 09/15/2014   Procedure: RIGHT WRIST DEQUERVAINS RELEASE ;  Surgeon: Mckinley Jewel, MD;  Location: [facility];  Service: Orthopedics;  Laterality: Right;   DRUG INDUCED ENDOSCOPY N/A 02/19/2022   Procedure: DRUG INDUCED SLEEP ENDOSCOPY;  Surgeon: Osborn Coho, MD;  Location: [facility];  Service: ENT;  Laterality: N/A;   ESOPHAGOGASTRODUODENOSCOPY     EYE SURGERY     cataract surgery   IMPLANTATION OF HYPOGLOSSAL NERVE STIMULATOR Right 04/23/2022   Procedure: IMPLANTATION OF HYPOGLOSSAL NERVE STIMULATOR;  Surgeon: Osborn Coho, MD;  Location: [facility];  Service: ENT;  Laterality: Right;   KNEE ARTHROSCOPY  04/15/2012   Procedure: ARTHROSCOPY KNEE;  Surgeon: Loreta Ave, MD;  Location: [facility] OR;  Service: Orthopedics;  Laterality: Right;   LAPAROSCOPIC CHOLECYSTECTOMY     LASIK     OOPHORECTOMY     PALATE TO GINGIVA GRAFT  2017   REVERSE SHOULDER ARTHROPLASTY Left 02/13/2021   Procedure: REVERSE SHOULDER ARTHROPLASTY;  Surgeon: Teryl Lucy, MD;  Location: [facility];  Service: Orthopedics;  Laterality: Left;   RIGHT/LEFT HEART CATH AND CORONARY ANGIOGRAPHY N/A 08/02/2016   Procedure: Right/Left Heart Cath and Coronary Angiography;  Surgeon: Tonny Bollman, MD;  Location: [facility] INVASIVE CV LAB;  Service: Cardiovascular;  OUTPATIENT PHYSICAL THERAPY SHOULDER    Patient Name: [name] MRN: [id number] DOB:December 20, 1950, 72 y.o., female Today's Date: 04/03/2023  END OF SESSION:  PT End of Session - 04/03/23 1149     Visit Number 16    Date for PT Re-Evaluation 05/05/23    PT Start Time 1142    PT Stop Time 1223    PT Time Calculation (min) 41 min    Activity Tolerance Patient tolerated treatment well    Behavior During Therapy WFL for tasks assessed/performed            Past Medical History:  Diagnosis Date   Allergy    Anxiety    Arthritis    back & knee   Asthma     mild per pt shows up with resp illness   Colon polyps    Constipation    Diabetes mellitus without complication (HCC)    Gallstones    Gastric polyps    Gastroparesis    GERD (gastroesophageal reflux disease)    Headache    sinus headaches and migraines at times   Heart murmur    History of migraine headaches    HTN (hypertension)    Hyperlipidemia    IBS (irritable bowel syndrome)    Joint pain    Lumbar disc disease    PONV (postoperative nausea and vomiting)    S/P aortic valve replacement with bioprosthetic valve 08/23/2016   25 mm Edwards Intuity rapid-deployment bovine pericardial tissue valve via partial upper mini sternotomy   Sleep apnea    does not use CPAP   TIA (transient ischemic attack)    TIA (transient ischemic attack)    hx of per pt   Past Surgical History:  Procedure Laterality Date   ABDOMINAL HYSTERECTOMY     AORTIC VALVE REPLACEMENT N/A 08/23/2016   Procedure: AORTIC VALVE REPLACEMENT (AVR) - using partial Upper Sternotomy- 25mm Edwards Intuity Aortic Valve used;  Surgeon: Purcell Nails, MD;  Location: [facility];  Service: Open Heart Surgery;  Laterality: N/A;   BLADDER SUSPENSION  2007   BREAST ENHANCEMENT SURGERY  1980   CARPAL TUNNEL RELEASE Bilateral    COLONOSCOPY     DORSAL COMPARTMENT RELEASE Right 09/15/2014   Procedure: RIGHT WRIST DEQUERVAINS RELEASE ;  Surgeon: Mckinley Jewel, MD;  Location: [facility];  Service: Orthopedics;  Laterality: Right;   DRUG INDUCED ENDOSCOPY N/A 02/19/2022   Procedure: DRUG INDUCED SLEEP ENDOSCOPY;  Surgeon: Osborn Coho, MD;  Location: [facility];  Service: ENT;  Laterality: N/A;   ESOPHAGOGASTRODUODENOSCOPY     EYE SURGERY     cataract surgery   IMPLANTATION OF HYPOGLOSSAL NERVE STIMULATOR Right 04/23/2022   Procedure: IMPLANTATION OF HYPOGLOSSAL NERVE STIMULATOR;  Surgeon: Osborn Coho, MD;  Location: [facility];  Service: ENT;  Laterality: Right;   KNEE ARTHROSCOPY  04/15/2012   Procedure: ARTHROSCOPY KNEE;  Surgeon: Loreta Ave, MD;  Location: [facility] OR;  Service: Orthopedics;  Laterality: Right;   LAPAROSCOPIC CHOLECYSTECTOMY     LASIK     OOPHORECTOMY     PALATE TO GINGIVA GRAFT  2017   REVERSE SHOULDER ARTHROPLASTY Left 02/13/2021   Procedure: REVERSE SHOULDER ARTHROPLASTY;  Surgeon: Teryl Lucy, MD;  Location: [facility];  Service: Orthopedics;  Laterality: Left;   RIGHT/LEFT HEART CATH AND CORONARY ANGIOGRAPHY N/A 08/02/2016   Procedure: Right/Left Heart Cath and Coronary Angiography;  Surgeon: Tonny Bollman, MD;  Location: [facility] INVASIVE CV LAB;  Service: Cardiovascular;

## 2023-04-07 ENCOUNTER — Ambulatory Visit: Payer: Medicare PPO | Admitting: Physical Therapy

## 2023-04-07 ENCOUNTER — Encounter: Payer: Self-pay | Admitting: Physical Therapy

## 2023-04-07 DIAGNOSIS — M25511 Pain in right shoulder: Secondary | ICD-10-CM | POA: Diagnosis not present

## 2023-04-07 DIAGNOSIS — G8918 Other acute postprocedural pain: Secondary | ICD-10-CM

## 2023-04-07 DIAGNOSIS — M6281 Muscle weakness (generalized): Secondary | ICD-10-CM

## 2023-04-07 DIAGNOSIS — M25611 Stiffness of right shoulder, not elsewhere classified: Secondary | ICD-10-CM | POA: Diagnosis not present

## 2023-04-07 DIAGNOSIS — R600 Localized edema: Secondary | ICD-10-CM | POA: Diagnosis not present

## 2023-04-07 DIAGNOSIS — Z96612 Presence of left artificial shoulder joint: Secondary | ICD-10-CM

## 2023-04-07 NOTE — Therapy (Signed)
mechanics, postural dysfunction, and pain.   ACTIVITY LIMITATIONS: carrying, lifting, sleeping, and reach over head  PARTICIPATION LIMITATIONS: meal prep, cleaning, laundry, driving, shopping, and community activity  PERSONAL FACTORS: Past/current experiences are also affecting patient's functional outcome.   REHAB POTENTIAL: Good  CLINICAL DECISION MAKING: Evolving/moderate complexity  EVALUATION COMPLEXITY: Moderate   GOALS: Goals reviewed with patient? Yes  SHORT TERM GOALS: Target date: 02/28/23  I with initial HEP Baseline: Goal status: 02/13/23 MET  LONG TERM GOALS: Target date: 05/05/23  I with final HEP Baseline:  Goal status: 03/10/23-ongoing  03/20/23 evolving  2.  Patient will recover at least 120 degrees of active R shoulder flexion and abduction Baseline: See ROM Goal status: 03/10/23 progressing  03/20/23 ongoing, 04/07/23, met for flex, ongoing for abd  3.  Increase RUE shoulder strength to at least 4/5 Baseline: 3-/5 Goal status: 03/17/23-flex 3+/5, Abd 3/5, 04/07/23- 4-/5 ongoing   4.  Patient will report the ability to perform all of her normal daily activities with RU. Baseline: Severely limited functionally Goal status: 04/07/23-met  5.  Decrease R shoulder pain to < 2/10 with all activity. Baseline: 4/10 Goal status: 04/07/23-3/10 during exercises, ongoing  PLAN:  PT FREQUENCY: 1-2x/week  PT DURATION: 12 weeks  PLANNED INTERVENTIONS: Therapeutic exercises, Therapeutic activity, Neuromuscular re-education, Balance training,  Gait training, Patient/Family education, Self Care, Joint mobilization, Dry Needling, Electrical stimulation, Cryotherapy, Moist heat, Vasopneumatic device, Ionotophoresis 4mg /ml Dexamethasone, and Manual therapy  PLAN FOR NEXT SESSION: 04/07/23-New exercises added to Hep, need to review and print them off for patient.   Patient Details  Name: [name] MRN: [id number] Date of Birth: February 18, 1951 Referring Provider:  Shelva Majestic, MD  Oley Balm, DPT  mechanics, postural dysfunction, and pain.   ACTIVITY LIMITATIONS: carrying, lifting, sleeping, and reach over head  PARTICIPATION LIMITATIONS: meal prep, cleaning, laundry, driving, shopping, and community activity  PERSONAL FACTORS: Past/current experiences are also affecting patient's functional outcome.   REHAB POTENTIAL: Good  CLINICAL DECISION MAKING: Evolving/moderate complexity  EVALUATION COMPLEXITY: Moderate   GOALS: Goals reviewed with patient? Yes  SHORT TERM GOALS: Target date: 02/28/23  I with initial HEP Baseline: Goal status: 02/13/23 MET  LONG TERM GOALS: Target date: 05/05/23  I with final HEP Baseline:  Goal status: 03/10/23-ongoing  03/20/23 evolving  2.  Patient will recover at least 120 degrees of active R shoulder flexion and abduction Baseline: See ROM Goal status: 03/10/23 progressing  03/20/23 ongoing, 04/07/23, met for flex, ongoing for abd  3.  Increase RUE shoulder strength to at least 4/5 Baseline: 3-/5 Goal status: 03/17/23-flex 3+/5, Abd 3/5, 04/07/23- 4-/5 ongoing   4.  Patient will report the ability to perform all of her normal daily activities with RU. Baseline: Severely limited functionally Goal status: 04/07/23-met  5.  Decrease R shoulder pain to < 2/10 with all activity. Baseline: 4/10 Goal status: 04/07/23-3/10 during exercises, ongoing  PLAN:  PT FREQUENCY: 1-2x/week  PT DURATION: 12 weeks  PLANNED INTERVENTIONS: Therapeutic exercises, Therapeutic activity, Neuromuscular re-education, Balance training,  Gait training, Patient/Family education, Self Care, Joint mobilization, Dry Needling, Electrical stimulation, Cryotherapy, Moist heat, Vasopneumatic device, Ionotophoresis 4mg /ml Dexamethasone, and Manual therapy  PLAN FOR NEXT SESSION: 04/07/23-New exercises added to Hep, need to review and print them off for patient.   Patient Details  Name: [name] MRN: [id number] Date of Birth: February 18, 1951 Referring Provider:  Shelva Majestic, MD  Oley Balm, DPT  mechanics, postural dysfunction, and pain.   ACTIVITY LIMITATIONS: carrying, lifting, sleeping, and reach over head  PARTICIPATION LIMITATIONS: meal prep, cleaning, laundry, driving, shopping, and community activity  PERSONAL FACTORS: Past/current experiences are also affecting patient's functional outcome.   REHAB POTENTIAL: Good  CLINICAL DECISION MAKING: Evolving/moderate complexity  EVALUATION COMPLEXITY: Moderate   GOALS: Goals reviewed with patient? Yes  SHORT TERM GOALS: Target date: 02/28/23  I with initial HEP Baseline: Goal status: 02/13/23 MET  LONG TERM GOALS: Target date: 05/05/23  I with final HEP Baseline:  Goal status: 03/10/23-ongoing  03/20/23 evolving  2.  Patient will recover at least 120 degrees of active R shoulder flexion and abduction Baseline: See ROM Goal status: 03/10/23 progressing  03/20/23 ongoing, 04/07/23, met for flex, ongoing for abd  3.  Increase RUE shoulder strength to at least 4/5 Baseline: 3-/5 Goal status: 03/17/23-flex 3+/5, Abd 3/5, 04/07/23- 4-/5 ongoing   4.  Patient will report the ability to perform all of her normal daily activities with RU. Baseline: Severely limited functionally Goal status: 04/07/23-met  5.  Decrease R shoulder pain to < 2/10 with all activity. Baseline: 4/10 Goal status: 04/07/23-3/10 during exercises, ongoing  PLAN:  PT FREQUENCY: 1-2x/week  PT DURATION: 12 weeks  PLANNED INTERVENTIONS: Therapeutic exercises, Therapeutic activity, Neuromuscular re-education, Balance training,  Gait training, Patient/Family education, Self Care, Joint mobilization, Dry Needling, Electrical stimulation, Cryotherapy, Moist heat, Vasopneumatic device, Ionotophoresis 4mg /ml Dexamethasone, and Manual therapy  PLAN FOR NEXT SESSION: 04/07/23-New exercises added to Hep, need to review and print them off for patient.   Patient Details  Name: [name] MRN: [id number] Date of Birth: February 18, 1951 Referring Provider:  Shelva Majestic, MD  Oley Balm, DPT  OUTPATIENT PHYSICAL THERAPY SHOULDER    Patient Name: [name] MRN: [id number] DOB:1951/04/18, 72 y.o., female Today's Date: 04/07/2023  END OF SESSION:  PT End of Session - 04/07/23 1506     Visit Number 17    Date for PT Re-Evaluation 05/05/23    PT Start Time 1458    PT Stop Time 1540    PT Time Calculation (min) 42 min    Activity Tolerance Patient tolerated treatment well    Behavior During Therapy WFL for tasks assessed/performed             Past Medical History:  Diagnosis Date   Allergy    Anxiety    Arthritis    back & knee   Asthma     mild per pt shows up with resp illness   Colon polyps    Constipation    Diabetes mellitus without complication (HCC)    Gallstones    Gastric polyps    Gastroparesis    GERD (gastroesophageal reflux disease)    Headache    sinus headaches and migraines at times   Heart murmur    History of migraine headaches    HTN (hypertension)    Hyperlipidemia    IBS (irritable bowel syndrome)    Joint pain    Lumbar disc disease    PONV (postoperative nausea and vomiting)    S/P aortic valve replacement with bioprosthetic valve 08/23/2016   25 mm Edwards Intuity rapid-deployment bovine pericardial tissue valve via partial upper mini sternotomy   Sleep apnea    does not use CPAP   TIA (transient ischemic attack)    TIA (transient ischemic attack)    hx of per pt   Past Surgical History:  Procedure Laterality Date   ABDOMINAL HYSTERECTOMY     AORTIC VALVE REPLACEMENT N/A 08/23/2016   Procedure: AORTIC VALVE REPLACEMENT (AVR) - using partial Upper Sternotomy- 25mm Edwards Intuity Aortic Valve used;  Surgeon: Purcell Nails, MD;  Location: [facility];  Service: Open Heart Surgery;  Laterality: N/A;   BLADDER SUSPENSION  2007   BREAST ENHANCEMENT SURGERY  1980   CARPAL TUNNEL RELEASE Bilateral    COLONOSCOPY     DORSAL COMPARTMENT RELEASE Right 09/15/2014   Procedure: RIGHT WRIST DEQUERVAINS RELEASE ;  Surgeon: Mckinley Jewel, MD;  Location: [facility];  Service: Orthopedics;  Laterality: Right;   DRUG INDUCED ENDOSCOPY N/A 02/19/2022   Procedure: DRUG INDUCED SLEEP ENDOSCOPY;  Surgeon: Osborn Coho, MD;  Location: [facility];  Service: ENT;  Laterality: N/A;   ESOPHAGOGASTRODUODENOSCOPY     EYE SURGERY     cataract surgery   IMPLANTATION OF HYPOGLOSSAL NERVE STIMULATOR Right 04/23/2022   Procedure: IMPLANTATION OF HYPOGLOSSAL NERVE STIMULATOR;  Surgeon: Osborn Coho, MD;  Location: [facility];  Service: ENT;  Laterality: Right;   KNEE ARTHROSCOPY  04/15/2012   Procedure: ARTHROSCOPY KNEE;  Surgeon: Loreta Ave, MD;  Location: [facility];  Service: Orthopedics;  Laterality: Right;   LAPAROSCOPIC CHOLECYSTECTOMY     LASIK     OOPHORECTOMY     PALATE TO GINGIVA GRAFT  2017   REVERSE SHOULDER ARTHROPLASTY Left 02/13/2021   Procedure: REVERSE SHOULDER ARTHROPLASTY;  Surgeon: Teryl Lucy, MD;  Location: [facility];  Service: Orthopedics;  Laterality: Left;   RIGHT/LEFT HEART CATH AND CORONARY ANGIOGRAPHY N/A 08/02/2016   Procedure: Right/Left Heart Cath and Coronary Angiography;  Surgeon: Tonny Bollman, MD;  Location: [facility];  Service:  mechanics, postural dysfunction, and pain.   ACTIVITY LIMITATIONS: carrying, lifting, sleeping, and reach over head  PARTICIPATION LIMITATIONS: meal prep, cleaning, laundry, driving, shopping, and community activity  PERSONAL FACTORS: Past/current experiences are also affecting patient's functional outcome.   REHAB POTENTIAL: Good  CLINICAL DECISION MAKING: Evolving/moderate complexity  EVALUATION COMPLEXITY: Moderate   GOALS: Goals reviewed with patient? Yes  SHORT TERM GOALS: Target date: 02/28/23  I with initial HEP Baseline: Goal status: 02/13/23 MET  LONG TERM GOALS: Target date: 05/05/23  I with final HEP Baseline:  Goal status: 03/10/23-ongoing  03/20/23 evolving  2.  Patient will recover at least 120 degrees of active R shoulder flexion and abduction Baseline: See ROM Goal status: 03/10/23 progressing  03/20/23 ongoing, 04/07/23, met for flex, ongoing for abd  3.  Increase RUE shoulder strength to at least 4/5 Baseline: 3-/5 Goal status: 03/17/23-flex 3+/5, Abd 3/5, 04/07/23- 4-/5 ongoing   4.  Patient will report the ability to perform all of her normal daily activities with RU. Baseline: Severely limited functionally Goal status: 04/07/23-met  5.  Decrease R shoulder pain to < 2/10 with all activity. Baseline: 4/10 Goal status: 04/07/23-3/10 during exercises, ongoing  PLAN:  PT FREQUENCY: 1-2x/week  PT DURATION: 12 weeks  PLANNED INTERVENTIONS: Therapeutic exercises, Therapeutic activity, Neuromuscular re-education, Balance training,  Gait training, Patient/Family education, Self Care, Joint mobilization, Dry Needling, Electrical stimulation, Cryotherapy, Moist heat, Vasopneumatic device, Ionotophoresis 4mg /ml Dexamethasone, and Manual therapy  PLAN FOR NEXT SESSION: 04/07/23-New exercises added to Hep, need to review and print them off for patient.   Patient Details  Name: [name] MRN: [id number] Date of Birth: February 18, 1951 Referring Provider:  Shelva Majestic, MD  Oley Balm, DPT

## 2023-04-09 DIAGNOSIS — M75101 Unspecified rotator cuff tear or rupture of right shoulder, not specified as traumatic: Secondary | ICD-10-CM | POA: Diagnosis not present

## 2023-04-10 ENCOUNTER — Ambulatory Visit: Payer: Medicare PPO | Admitting: Physical Therapy

## 2023-04-10 DIAGNOSIS — M25611 Stiffness of right shoulder, not elsewhere classified: Secondary | ICD-10-CM | POA: Diagnosis not present

## 2023-04-10 DIAGNOSIS — R600 Localized edema: Secondary | ICD-10-CM

## 2023-04-10 DIAGNOSIS — Z96612 Presence of left artificial shoulder joint: Secondary | ICD-10-CM

## 2023-04-10 DIAGNOSIS — G8918 Other acute postprocedural pain: Secondary | ICD-10-CM | POA: Diagnosis not present

## 2023-04-10 DIAGNOSIS — M25511 Pain in right shoulder: Secondary | ICD-10-CM | POA: Diagnosis not present

## 2023-04-10 DIAGNOSIS — M6281 Muscle weakness (generalized): Secondary | ICD-10-CM | POA: Diagnosis not present

## 2023-04-10 NOTE — Therapy (Signed)
Pt arrives stating MD very pleased. Performed and issued new HEP ex . Continue to progress strengthening. OBJECTIVE IMPAIRMENTS: decreased activity tolerance, decreased balance, decreased coordination, decreased ROM, decreased strength, impaired flexibility, impaired UE functional use, improper body mechanics, postural dysfunction, and pain.   ACTIVITY LIMITATIONS: carrying, lifting, sleeping, and reach over head  PARTICIPATION LIMITATIONS: meal prep, cleaning, laundry, driving, shopping, and community activity  PERSONAL FACTORS: Past/current experiences are also affecting patient's functional outcome.   REHAB POTENTIAL: Good  CLINICAL DECISION MAKING: Evolving/moderate complexity  EVALUATION COMPLEXITY: Moderate   GOALS: Goals reviewed with patient? Yes  SHORT TERM GOALS: Target date: 02/28/23  I with initial HEP Baseline: Goal status: 02/13/23 MET  LONG TERM GOALS: Target date: 05/05/23  I with final HEP Baseline:  Goal status: 03/10/23-ongoing  03/20/23 evolving  2.  Patient will recover at least 120 degrees of active R shoulder flexion and abduction Baseline: See ROM Goal status: 03/10/23 progressing  03/20/23 ongoing, 04/07/23, met for flex, ongoing for abd  3.  Increase RUE shoulder strength to at least 4/5 Baseline: 3-/5 Goal status: 03/17/23-flex 3+/5, Abd 3/5, 04/07/23- 4-/5 ongoing   4.  Patient will report the ability to perform all of her normal daily activities with RU. Baseline: Severely limited functionally Goal status: 04/07/23-met  5.  Decrease R shoulder pain to < 2/10 with all activity. Baseline: 4/10 Goal status: 04/07/23-3/10 during exercises, ongoing  PLAN:  PT FREQUENCY: 1-2x/week  PT DURATION: 12 weeks  PLANNED INTERVENTIONS: Therapeutic exercises,  Therapeutic activity, Neuromuscular re-education, Balance training, Gait training, Patient/Family education, Self Care, Joint mobilization, Dry Needling, Electrical stimulation, Cryotherapy, Moist heat, Vasopneumatic device, Ionotophoresis 4mg /ml Dexamethasone, and Manual therapy  PLAN FOR NEXT SESSION: progress srtengthening Patient Details  Name: [name] MRN: [id number] Date of Birth: 1951/06/22 Referring Provider:  Shelva Majestic, MD   Patient Details  Name: [name] MRN: [id number] Date of Birth: 1950/09/05 Referring Provider:  Shelva Majestic, MD  Encounter Date: 04/10/2023   Suanne Marker, PTA 04/10/2023, 2:43 PM  [facility] [address] [place], [place], [address] Phone: [phone]   Fax:  [phone]  OUTPATIENT PHYSICAL THERAPY SHOULDER    Patient Name: [name] MRN: [id number] DOB:Jul 08, 1950, 72 y.o., female Today's Date: 04/10/2023  END OF SESSION:  PT End of Session - 04/10/23 1440     Visit Number 18    Date for PT Re-Evaluation 05/05/23    PT Start Time 1440    PT Stop Time 1540    PT Time Calculation (min) 60 min             Past Medical History:  Diagnosis Date   Allergy    Anxiety    Arthritis    back & knee   Asthma     mild per pt shows up with resp illness   Colon polyps    Constipation    Diabetes mellitus without complication (HCC)    Gallstones    Gastric polyps    Gastroparesis    GERD (gastroesophageal reflux disease)    Headache    sinus headaches and migraines at times   Heart murmur    History of migraine headaches    HTN (hypertension)    Hyperlipidemia    IBS (irritable bowel syndrome)    Joint pain    Lumbar disc disease    PONV (postoperative nausea and vomiting)    S/P aortic valve replacement with bioprosthetic valve 08/23/2016   25 mm Edwards Intuity rapid-deployment bovine pericardial tissue valve via partial upper mini sternotomy   Sleep apnea    does not use CPAP   TIA (transient ischemic attack)    TIA (transient ischemic attack)    hx of per pt   Past Surgical History:  Procedure Laterality Date   ABDOMINAL HYSTERECTOMY     AORTIC VALVE REPLACEMENT N/A 08/23/2016   Procedure: AORTIC VALVE REPLACEMENT (AVR) - using partial Upper Sternotomy- 25mm Edwards Intuity Aortic Valve used;  Surgeon: Purcell Nails, MD;  Location: [facility] OR;  Service: Open Heart Surgery;  Laterality: N/A;   BLADDER SUSPENSION  2007   BREAST ENHANCEMENT SURGERY  1980   CARPAL TUNNEL RELEASE Bilateral    COLONOSCOPY     DORSAL COMPARTMENT RELEASE Right 09/15/2014   Procedure: RIGHT WRIST DEQUERVAINS RELEASE ;  Surgeon: Mckinley Jewel, MD;  Location: [facility];  Service: Orthopedics;  Laterality: Right;   DRUG INDUCED ENDOSCOPY  N/A 02/19/2022   Procedure: DRUG INDUCED SLEEP ENDOSCOPY;  Surgeon: Osborn Coho, MD;  Location: [facility];  Service: ENT;  Laterality: N/A;   ESOPHAGOGASTRODUODENOSCOPY     EYE SURGERY     cataract surgery   IMPLANTATION OF HYPOGLOSSAL NERVE STIMULATOR Right 04/23/2022   Procedure: IMPLANTATION OF HYPOGLOSSAL NERVE STIMULATOR;  Surgeon: Osborn Coho, MD;  Location: [facility];  Service: ENT;  Laterality: Right;   KNEE ARTHROSCOPY  04/15/2012   Procedure: ARTHROSCOPY KNEE;  Surgeon: Loreta Ave, MD;  Location: [facility] OR;  Service: Orthopedics;  Laterality: Right;   LAPAROSCOPIC CHOLECYSTECTOMY     LASIK     OOPHORECTOMY     PALATE TO GINGIVA GRAFT  2017   REVERSE SHOULDER ARTHROPLASTY Left 02/13/2021   Procedure: REVERSE SHOULDER ARTHROPLASTY;  Surgeon: Teryl Lucy, MD;  Location: [facility];  Service: Orthopedics;  Laterality: Left;   RIGHT/LEFT HEART CATH AND CORONARY ANGIOGRAPHY N/A 08/02/2016   Procedure: Right/Left Heart Cath and Coronary Angiography;  Surgeon: Tonny Bollman, MD;  Location: [facility] INVASIVE CV LAB;  Service: Cardiovascular;  Laterality: N/A;   ROTATOR CUFF REPAIR Left    SHOULDER ARTHROSCOPY WITH ROTATOR CUFF REPAIR  Pt arrives stating MD very pleased. Performed and issued new HEP ex . Continue to progress strengthening. OBJECTIVE IMPAIRMENTS: decreased activity tolerance, decreased balance, decreased coordination, decreased ROM, decreased strength, impaired flexibility, impaired UE functional use, improper body mechanics, postural dysfunction, and pain.   ACTIVITY LIMITATIONS: carrying, lifting, sleeping, and reach over head  PARTICIPATION LIMITATIONS: meal prep, cleaning, laundry, driving, shopping, and community activity  PERSONAL FACTORS: Past/current experiences are also affecting patient's functional outcome.   REHAB POTENTIAL: Good  CLINICAL DECISION MAKING: Evolving/moderate complexity  EVALUATION COMPLEXITY: Moderate   GOALS: Goals reviewed with patient? Yes  SHORT TERM GOALS: Target date: 02/28/23  I with initial HEP Baseline: Goal status: 02/13/23 MET  LONG TERM GOALS: Target date: 05/05/23  I with final HEP Baseline:  Goal status: 03/10/23-ongoing  03/20/23 evolving  2.  Patient will recover at least 120 degrees of active R shoulder flexion and abduction Baseline: See ROM Goal status: 03/10/23 progressing  03/20/23 ongoing, 04/07/23, met for flex, ongoing for abd  3.  Increase RUE shoulder strength to at least 4/5 Baseline: 3-/5 Goal status: 03/17/23-flex 3+/5, Abd 3/5, 04/07/23- 4-/5 ongoing   4.  Patient will report the ability to perform all of her normal daily activities with RU. Baseline: Severely limited functionally Goal status: 04/07/23-met  5.  Decrease R shoulder pain to < 2/10 with all activity. Baseline: 4/10 Goal status: 04/07/23-3/10 during exercises, ongoing  PLAN:  PT FREQUENCY: 1-2x/week  PT DURATION: 12 weeks  PLANNED INTERVENTIONS: Therapeutic exercises,  Therapeutic activity, Neuromuscular re-education, Balance training, Gait training, Patient/Family education, Self Care, Joint mobilization, Dry Needling, Electrical stimulation, Cryotherapy, Moist heat, Vasopneumatic device, Ionotophoresis 4mg /ml Dexamethasone, and Manual therapy  PLAN FOR NEXT SESSION: progress srtengthening Patient Details  Name: [name] MRN: [id number] Date of Birth: 1951/06/22 Referring Provider:  Shelva Majestic, MD   Patient Details  Name: [name] MRN: [id number] Date of Birth: 1950/09/05 Referring Provider:  Shelva Majestic, MD  Encounter Date: 04/10/2023   Suanne Marker, PTA 04/10/2023, 2:43 PM  [facility] [address] [place], [place], [address] Phone: [phone]   Fax:  [phone]  Pt arrives stating MD very pleased. Performed and issued new HEP ex . Continue to progress strengthening. OBJECTIVE IMPAIRMENTS: decreased activity tolerance, decreased balance, decreased coordination, decreased ROM, decreased strength, impaired flexibility, impaired UE functional use, improper body mechanics, postural dysfunction, and pain.   ACTIVITY LIMITATIONS: carrying, lifting, sleeping, and reach over head  PARTICIPATION LIMITATIONS: meal prep, cleaning, laundry, driving, shopping, and community activity  PERSONAL FACTORS: Past/current experiences are also affecting patient's functional outcome.   REHAB POTENTIAL: Good  CLINICAL DECISION MAKING: Evolving/moderate complexity  EVALUATION COMPLEXITY: Moderate   GOALS: Goals reviewed with patient? Yes  SHORT TERM GOALS: Target date: 02/28/23  I with initial HEP Baseline: Goal status: 02/13/23 MET  LONG TERM GOALS: Target date: 05/05/23  I with final HEP Baseline:  Goal status: 03/10/23-ongoing  03/20/23 evolving  2.  Patient will recover at least 120 degrees of active R shoulder flexion and abduction Baseline: See ROM Goal status: 03/10/23 progressing  03/20/23 ongoing, 04/07/23, met for flex, ongoing for abd  3.  Increase RUE shoulder strength to at least 4/5 Baseline: 3-/5 Goal status: 03/17/23-flex 3+/5, Abd 3/5, 04/07/23- 4-/5 ongoing   4.  Patient will report the ability to perform all of her normal daily activities with RU. Baseline: Severely limited functionally Goal status: 04/07/23-met  5.  Decrease R shoulder pain to < 2/10 with all activity. Baseline: 4/10 Goal status: 04/07/23-3/10 during exercises, ongoing  PLAN:  PT FREQUENCY: 1-2x/week  PT DURATION: 12 weeks  PLANNED INTERVENTIONS: Therapeutic exercises,  Therapeutic activity, Neuromuscular re-education, Balance training, Gait training, Patient/Family education, Self Care, Joint mobilization, Dry Needling, Electrical stimulation, Cryotherapy, Moist heat, Vasopneumatic device, Ionotophoresis 4mg /ml Dexamethasone, and Manual therapy  PLAN FOR NEXT SESSION: progress srtengthening Patient Details  Name: [name] MRN: [id number] Date of Birth: 1951/06/22 Referring Provider:  Shelva Majestic, MD   Patient Details  Name: [name] MRN: [id number] Date of Birth: 1950/09/05 Referring Provider:  Shelva Majestic, MD  Encounter Date: 04/10/2023   Suanne Marker, PTA 04/10/2023, 2:43 PM  [facility] [address] [place], [place], [address] Phone: [phone]   Fax:  [phone]  OUTPATIENT PHYSICAL THERAPY SHOULDER    Patient Name: [name] MRN: [id number] DOB:Jul 08, 1950, 72 y.o., female Today's Date: 04/10/2023  END OF SESSION:  PT End of Session - 04/10/23 1440     Visit Number 18    Date for PT Re-Evaluation 05/05/23    PT Start Time 1440    PT Stop Time 1540    PT Time Calculation (min) 60 min             Past Medical History:  Diagnosis Date   Allergy    Anxiety    Arthritis    back & knee   Asthma     mild per pt shows up with resp illness   Colon polyps    Constipation    Diabetes mellitus without complication (HCC)    Gallstones    Gastric polyps    Gastroparesis    GERD (gastroesophageal reflux disease)    Headache    sinus headaches and migraines at times   Heart murmur    History of migraine headaches    HTN (hypertension)    Hyperlipidemia    IBS (irritable bowel syndrome)    Joint pain    Lumbar disc disease    PONV (postoperative nausea and vomiting)    S/P aortic valve replacement with bioprosthetic valve 08/23/2016   25 mm Edwards Intuity rapid-deployment bovine pericardial tissue valve via partial upper mini sternotomy   Sleep apnea    does not use CPAP   TIA (transient ischemic attack)    TIA (transient ischemic attack)    hx of per pt   Past Surgical History:  Procedure Laterality Date   ABDOMINAL HYSTERECTOMY     AORTIC VALVE REPLACEMENT N/A 08/23/2016   Procedure: AORTIC VALVE REPLACEMENT (AVR) - using partial Upper Sternotomy- 25mm Edwards Intuity Aortic Valve used;  Surgeon: Purcell Nails, MD;  Location: [facility];  Service: Open Heart Surgery;  Laterality: N/A;   BLADDER SUSPENSION  2007   BREAST ENHANCEMENT SURGERY  1980   CARPAL TUNNEL RELEASE Bilateral    COLONOSCOPY     DORSAL COMPARTMENT RELEASE Right 09/15/2014   Procedure: RIGHT WRIST DEQUERVAINS RELEASE ;  Surgeon: Mckinley Jewel, MD;  Location: [facility];  Service: Orthopedics;  Laterality: Right;   DRUG INDUCED ENDOSCOPY  N/A 02/19/2022   Procedure: DRUG INDUCED SLEEP ENDOSCOPY;  Surgeon: Osborn Coho, MD;  Location: [facility];  Service: ENT;  Laterality: N/A;   ESOPHAGOGASTRODUODENOSCOPY     EYE SURGERY     cataract surgery   IMPLANTATION OF HYPOGLOSSAL NERVE STIMULATOR Right 04/23/2022   Procedure: IMPLANTATION OF HYPOGLOSSAL NERVE STIMULATOR;  Surgeon: Osborn Coho, MD;  Location: [facility];  Service: ENT;  Laterality: Right;   KNEE ARTHROSCOPY  04/15/2012   Procedure: ARTHROSCOPY KNEE;  Surgeon: Loreta Ave, MD;  Location: [facility] OR;  Service: Orthopedics;  Laterality: Right;   LAPAROSCOPIC CHOLECYSTECTOMY     LASIK     OOPHORECTOMY     PALATE TO GINGIVA GRAFT  2017   REVERSE SHOULDER ARTHROPLASTY Left 02/13/2021   Procedure: REVERSE SHOULDER ARTHROPLASTY;  Surgeon: Teryl Lucy, MD;  Location: [facility];  Service: Orthopedics;  Laterality: Left;   RIGHT/LEFT HEART CATH AND CORONARY ANGIOGRAPHY N/A 08/02/2016   Procedure: Right/Left Heart Cath and Coronary Angiography;  Surgeon: Tonny Bollman, MD;  Location: [facility] INVASIVE CV LAB;  Service: Cardiovascular;  Laterality: N/A;   ROTATOR CUFF REPAIR Left    SHOULDER ARTHROSCOPY WITH ROTATOR CUFF REPAIR

## 2023-04-14 ENCOUNTER — Encounter: Payer: Self-pay | Admitting: Physical Therapy

## 2023-04-14 ENCOUNTER — Ambulatory Visit: Payer: Medicare PPO | Admitting: Physical Therapy

## 2023-04-14 DIAGNOSIS — R600 Localized edema: Secondary | ICD-10-CM

## 2023-04-14 DIAGNOSIS — Z96612 Presence of left artificial shoulder joint: Secondary | ICD-10-CM

## 2023-04-14 DIAGNOSIS — M6281 Muscle weakness (generalized): Secondary | ICD-10-CM | POA: Diagnosis not present

## 2023-04-14 DIAGNOSIS — M25611 Stiffness of right shoulder, not elsewhere classified: Secondary | ICD-10-CM | POA: Diagnosis not present

## 2023-04-14 DIAGNOSIS — G8918 Other acute postprocedural pain: Secondary | ICD-10-CM | POA: Diagnosis not present

## 2023-04-14 DIAGNOSIS — M25511 Pain in right shoulder: Secondary | ICD-10-CM | POA: Diagnosis not present

## 2023-04-14 NOTE — Therapy (Signed)
OUTPATIENT PHYSICAL THERAPY SHOULDER    Patient Name: [name] MRN: [id number] DOB:1950/07/26, 72 y.o., female Today's Date: 04/14/2023  END OF SESSION:  PT End of Session - 04/14/23 1500     Visit Number 19    Date for PT Re-Evaluation 05/05/23    PT Start Time 1452    PT Stop Time 1532    PT Time Calculation (min) 40 min    Activity Tolerance Patient tolerated treatment well    Behavior During Therapy WFL for tasks assessed/performed            Past Medical History:  Diagnosis Date   Allergy    Anxiety    Arthritis    back & knee   Asthma     mild per pt shows up with resp illness   Colon polyps    Constipation    Diabetes mellitus without complication (HCC)    Gallstones    Gastric polyps    Gastroparesis    GERD (gastroesophageal reflux disease)    Headache    sinus headaches and migraines at times   Heart murmur    History of migraine headaches    HTN (hypertension)    Hyperlipidemia    IBS (irritable bowel syndrome)    Joint pain    Lumbar disc disease    PONV (postoperative nausea and vomiting)    S/P aortic valve replacement with bioprosthetic valve 08/23/2016   25 mm Edwards Intuity rapid-deployment bovine pericardial tissue valve via partial upper mini sternotomy   Sleep apnea    does not use CPAP   TIA (transient ischemic attack)    TIA (transient ischemic attack)    hx of per pt   Past Surgical History:  Procedure Laterality Date   ABDOMINAL HYSTERECTOMY     AORTIC VALVE REPLACEMENT N/A 08/23/2016   Procedure: AORTIC VALVE REPLACEMENT (AVR) - using partial Upper Sternotomy- 25mm Edwards Intuity Aortic Valve used;  Surgeon: Purcell Nails, MD;  Location: [facility];  Service: Open Heart Surgery;  Laterality: N/A;   BLADDER SUSPENSION  2007   BREAST ENHANCEMENT SURGERY  1980   CARPAL TUNNEL RELEASE Bilateral    COLONOSCOPY     DORSAL COMPARTMENT RELEASE Right 09/15/2014   Procedure: RIGHT WRIST DEQUERVAINS RELEASE ;  Surgeon: Mckinley Jewel, MD;  Location: [facility];  Service: Orthopedics;  Laterality: Right;   DRUG INDUCED ENDOSCOPY N/A 02/19/2022   Procedure: DRUG INDUCED SLEEP ENDOSCOPY;  Surgeon: Osborn Coho, MD;  Location: [facility];  Service: ENT;  Laterality: N/A;   ESOPHAGOGASTRODUODENOSCOPY     EYE SURGERY     cataract surgery   IMPLANTATION OF HYPOGLOSSAL NERVE STIMULATOR Right 04/23/2022   Procedure: IMPLANTATION OF HYPOGLOSSAL NERVE STIMULATOR;  Surgeon: Osborn Coho, MD;  Location: [facility];  Service: ENT;  Laterality: Right;   KNEE ARTHROSCOPY  04/15/2012   Procedure: ARTHROSCOPY KNEE;  Surgeon: Loreta Ave, MD;  Location: [facility] OR;  Service: Orthopedics;  Laterality: Right;   LAPAROSCOPIC CHOLECYSTECTOMY     LASIK     OOPHORECTOMY     PALATE TO GINGIVA GRAFT  2017   REVERSE SHOULDER ARTHROPLASTY Left 02/13/2021   Procedure: REVERSE SHOULDER ARTHROPLASTY;  Surgeon: Teryl Lucy, MD;  Location: [facility];  Service: Orthopedics;  Laterality: Left;   RIGHT/LEFT HEART CATH AND CORONARY ANGIOGRAPHY N/A 08/02/2016   Procedure: Right/Left Heart Cath and Coronary Angiography;  Surgeon: Tonny Bollman, MD;  Location: [facility] INVASIVE CV LAB;  Service: Cardiovascular;  overhead flex and ext facing and then with back to wall, 3# weight x 10 each. Unable to quite reach the wall when facing away. Body Blade in 90 felx and abd, horiz and vert, 15 sec each. VASO to R shoulder for pain and inflammation control x 10 min, med pressure.  PATIENT EDUCATION: Education details: POC Person educated: Patient Education method: Explanation Education comprehension: verbalized understanding  HOME EXERCISE PROGRAM: [id number]  ASSESSMENT:  CLINICAL IMPRESSION:   Pt reports that she was sore for 2 days after last treatment, probably due to increased resistance of weights, but it feels better today. Continued with progression of strengthening exercises, along with STM and stretch. She tolerated all activities with mild soreness, but able to fully complete.  OBJECTIVE IMPAIRMENTS: decreased activity tolerance, decreased balance, decreased coordination, decreased ROM, decreased strength, impaired flexibility, impaired UE functional use, improper body mechanics, postural dysfunction, and pain.   ACTIVITY LIMITATIONS: carrying, lifting, sleeping, and reach over head  PARTICIPATION LIMITATIONS: meal prep, cleaning, laundry, driving, shopping, and community activity  PERSONAL FACTORS: Past/current experiences are also affecting patient's functional outcome.   REHAB POTENTIAL: Good  CLINICAL DECISION MAKING: Evolving/moderate complexity  EVALUATION COMPLEXITY: Moderate   GOALS: Goals reviewed with patient? Yes  SHORT TERM GOALS: Target date: 02/28/23  I with initial HEP Baseline: Goal status: 02/13/23 MET  LONG TERM GOALS: Target date: 05/05/23  I with final HEP Baseline:  Goal status: 03/10/23-ongoing  03/20/23 evolving  2.  Patient will  recover at least 120 degrees of active R shoulder flexion and abduction Baseline: See ROM Goal status: 03/10/23 progressing  03/20/23 ongoing, 04/07/23, met for flex, ongoing for abd  3.  Increase RUE shoulder strength to at least 4/5 Baseline: 3-/5 Goal status: 03/17/23-flex 3+/5, Abd 3/5, 04/07/23- 4-/5 ongoing   4.  Patient will report the ability to perform all of her normal daily activities with RU. Baseline: Severely limited functionally Goal status: 04/07/23-met  5.  Decrease R shoulder pain to < 2/10 with all activity. Baseline: 4/10 Goal status: 04/07/23-3/10 during exercises, ongoing  PLAN:  PT FREQUENCY: 1-2x/week  PT DURATION: 12 weeks  PLANNED INTERVENTIONS: Therapeutic exercises, Therapeutic activity, Neuromuscular re-education, Balance training, Gait training, Patient/Family education, Self Care, Joint mobilization, Dry Needling, Electrical stimulation, Cryotherapy, Moist heat, Vasopneumatic device, Ionotophoresis 4mg /ml Dexamethasone, and Manual therapy  PLAN FOR NEXT SESSION: progress srtengthening  Patient Details  Name: [name] MRN: [id number] Date of Birth: Nov 14, 1950 Referring Provider:  Shelva Majestic, MD  Encounter Date: 04/14/2023  Iona Beard, DPT 04/14/2023, 3:35 PM  [facility] [address] [place], [place], [address] Phone: [phone]   Fax:  [phone]  OUTPATIENT PHYSICAL THERAPY SHOULDER    Patient Name: [name] MRN: [id number] DOB:1950/07/26, 72 y.o., female Today's Date: 04/14/2023  END OF SESSION:  PT End of Session - 04/14/23 1500     Visit Number 19    Date for PT Re-Evaluation 05/05/23    PT Start Time 1452    PT Stop Time 1532    PT Time Calculation (min) 40 min    Activity Tolerance Patient tolerated treatment well    Behavior During Therapy WFL for tasks assessed/performed            Past Medical History:  Diagnosis Date   Allergy    Anxiety    Arthritis    back & knee   Asthma     mild per pt shows up with resp illness   Colon polyps    Constipation    Diabetes mellitus without complication (HCC)    Gallstones    Gastric polyps    Gastroparesis    GERD (gastroesophageal reflux disease)    Headache    sinus headaches and migraines at times   Heart murmur    History of migraine headaches    HTN (hypertension)    Hyperlipidemia    IBS (irritable bowel syndrome)    Joint pain    Lumbar disc disease    PONV (postoperative nausea and vomiting)    S/P aortic valve replacement with bioprosthetic valve 08/23/2016   25 mm Edwards Intuity rapid-deployment bovine pericardial tissue valve via partial upper mini sternotomy   Sleep apnea    does not use CPAP   TIA (transient ischemic attack)    TIA (transient ischemic attack)    hx of per pt   Past Surgical History:  Procedure Laterality Date   ABDOMINAL HYSTERECTOMY     AORTIC VALVE REPLACEMENT N/A 08/23/2016   Procedure: AORTIC VALVE REPLACEMENT (AVR) - using partial Upper Sternotomy- 25mm Edwards Intuity Aortic Valve used;  Surgeon: Purcell Nails, MD;  Location: [facility];  Service: Open Heart Surgery;  Laterality: N/A;   BLADDER SUSPENSION  2007   BREAST ENHANCEMENT SURGERY  1980   CARPAL TUNNEL RELEASE Bilateral    COLONOSCOPY     DORSAL COMPARTMENT RELEASE Right 09/15/2014   Procedure: RIGHT WRIST DEQUERVAINS RELEASE ;  Surgeon: Mckinley Jewel, MD;  Location: [facility];  Service: Orthopedics;  Laterality: Right;   DRUG INDUCED ENDOSCOPY N/A 02/19/2022   Procedure: DRUG INDUCED SLEEP ENDOSCOPY;  Surgeon: Osborn Coho, MD;  Location: [facility];  Service: ENT;  Laterality: N/A;   ESOPHAGOGASTRODUODENOSCOPY     EYE SURGERY     cataract surgery   IMPLANTATION OF HYPOGLOSSAL NERVE STIMULATOR Right 04/23/2022   Procedure: IMPLANTATION OF HYPOGLOSSAL NERVE STIMULATOR;  Surgeon: Osborn Coho, MD;  Location: [facility];  Service: ENT;  Laterality: Right;   KNEE ARTHROSCOPY  04/15/2012   Procedure: ARTHROSCOPY KNEE;  Surgeon: Loreta Ave, MD;  Location: [facility] OR;  Service: Orthopedics;  Laterality: Right;   LAPAROSCOPIC CHOLECYSTECTOMY     LASIK     OOPHORECTOMY     PALATE TO GINGIVA GRAFT  2017   REVERSE SHOULDER ARTHROPLASTY Left 02/13/2021   Procedure: REVERSE SHOULDER ARTHROPLASTY;  Surgeon: Teryl Lucy, MD;  Location: [facility];  Service: Orthopedics;  Laterality: Left;   RIGHT/LEFT HEART CATH AND CORONARY ANGIOGRAPHY N/A 08/02/2016   Procedure: Right/Left Heart Cath and Coronary Angiography;  Surgeon: Tonny Bollman, MD;  Location: [facility] IN[facility];  Service: Cardiovascular;  overhead flex and ext facing and then with back to wall, 3# weight x 10 each. Unable to quite reach the wall when facing away. Body Blade in 90 felx and abd, horiz and vert, 15 sec each. VASO to R shoulder for pain and inflammation control x 10 min, med pressure.  PATIENT EDUCATION: Education details: POC Person educated: Patient Education method: Explanation Education comprehension: verbalized understanding  HOME EXERCISE PROGRAM: [id number]  ASSESSMENT:  CLINICAL IMPRESSION:   Pt reports that she was sore for 2 days after last treatment, probably due to increased resistance of weights, but it feels better today. Continued with progression of strengthening exercises, along with STM and stretch. She tolerated all activities with mild soreness, but able to fully complete.  OBJECTIVE IMPAIRMENTS: decreased activity tolerance, decreased balance, decreased coordination, decreased ROM, decreased strength, impaired flexibility, impaired UE functional use, improper body mechanics, postural dysfunction, and pain.   ACTIVITY LIMITATIONS: carrying, lifting, sleeping, and reach over head  PARTICIPATION LIMITATIONS: meal prep, cleaning, laundry, driving, shopping, and community activity  PERSONAL FACTORS: Past/current experiences are also affecting patient's functional outcome.   REHAB POTENTIAL: Good  CLINICAL DECISION MAKING: Evolving/moderate complexity  EVALUATION COMPLEXITY: Moderate   GOALS: Goals reviewed with patient? Yes  SHORT TERM GOALS: Target date: 02/28/23  I with initial HEP Baseline: Goal status: 02/13/23 MET  LONG TERM GOALS: Target date: 05/05/23  I with final HEP Baseline:  Goal status: 03/10/23-ongoing  03/20/23 evolving  2.  Patient will  recover at least 120 degrees of active R shoulder flexion and abduction Baseline: See ROM Goal status: 03/10/23 progressing  03/20/23 ongoing, 04/07/23, met for flex, ongoing for abd  3.  Increase RUE shoulder strength to at least 4/5 Baseline: 3-/5 Goal status: 03/17/23-flex 3+/5, Abd 3/5, 04/07/23- 4-/5 ongoing   4.  Patient will report the ability to perform all of her normal daily activities with RU. Baseline: Severely limited functionally Goal status: 04/07/23-met  5.  Decrease R shoulder pain to < 2/10 with all activity. Baseline: 4/10 Goal status: 04/07/23-3/10 during exercises, ongoing  PLAN:  PT FREQUENCY: 1-2x/week  PT DURATION: 12 weeks  PLANNED INTERVENTIONS: Therapeutic exercises, Therapeutic activity, Neuromuscular re-education, Balance training, Gait training, Patient/Family education, Self Care, Joint mobilization, Dry Needling, Electrical stimulation, Cryotherapy, Moist heat, Vasopneumatic device, Ionotophoresis 4mg /ml Dexamethasone, and Manual therapy  PLAN FOR NEXT SESSION: progress srtengthening  Patient Details  Name: [name] MRN: [id number] Date of Birth: Nov 14, 1950 Referring Provider:  Shelva Majestic, MD  Encounter Date: 04/14/2023  Iona Beard, DPT 04/14/2023, 3:35 PM  [facility] [address] [place], [place], [address] Phone: [phone]   Fax:  [phone]  OUTPATIENT PHYSICAL THERAPY SHOULDER    Patient Name: [name] MRN: [id number] DOB:1950/07/26, 72 y.o., female Today's Date: 04/14/2023  END OF SESSION:  PT End of Session - 04/14/23 1500     Visit Number 19    Date for PT Re-Evaluation 05/05/23    PT Start Time 1452    PT Stop Time 1532    PT Time Calculation (min) 40 min    Activity Tolerance Patient tolerated treatment well    Behavior During Therapy WFL for tasks assessed/performed            Past Medical History:  Diagnosis Date   Allergy    Anxiety    Arthritis    back & knee   Asthma     mild per pt shows up with resp illness   Colon polyps    Constipation    Diabetes mellitus without complication (HCC)    Gallstones    Gastric polyps    Gastroparesis    GERD (gastroesophageal reflux disease)    Headache    sinus headaches and migraines at times   Heart murmur    History of migraine headaches    HTN (hypertension)    Hyperlipidemia    IBS (irritable bowel syndrome)    Joint pain    Lumbar disc disease    PONV (postoperative nausea and vomiting)    S/P aortic valve replacement with bioprosthetic valve 08/23/2016   25 mm Edwards Intuity rapid-deployment bovine pericardial tissue valve via partial upper mini sternotomy   Sleep apnea    does not use CPAP   TIA (transient ischemic attack)    TIA (transient ischemic attack)    hx of per pt   Past Surgical History:  Procedure Laterality Date   ABDOMINAL HYSTERECTOMY     AORTIC VALVE REPLACEMENT N/A 08/23/2016   Procedure: AORTIC VALVE REPLACEMENT (AVR) - using partial Upper Sternotomy- 25mm Edwards Intuity Aortic Valve used;  Surgeon: Purcell Nails, MD;  Location: [facility];  Service: Open Heart Surgery;  Laterality: N/A;   BLADDER SUSPENSION  2007   BREAST ENHANCEMENT SURGERY  1980   CARPAL TUNNEL RELEASE Bilateral    COLONOSCOPY     DORSAL COMPARTMENT RELEASE Right 09/15/2014   Procedure: RIGHT WRIST DEQUERVAINS RELEASE ;  Surgeon: Mckinley Jewel, MD;  Location: [facility];  Service: Orthopedics;  Laterality: Right;   DRUG INDUCED ENDOSCOPY N/A 02/19/2022   Procedure: DRUG INDUCED SLEEP ENDOSCOPY;  Surgeon: Osborn Coho, MD;  Location: [facility];  Service: ENT;  Laterality: N/A;   ESOPHAGOGASTRODUODENOSCOPY     EYE SURGERY     cataract surgery   IMPLANTATION OF HYPOGLOSSAL NERVE STIMULATOR Right 04/23/2022   Procedure: IMPLANTATION OF HYPOGLOSSAL NERVE STIMULATOR;  Surgeon: Osborn Coho, MD;  Location: [facility];  Service: ENT;  Laterality: Right;   KNEE ARTHROSCOPY  04/15/2012   Procedure: ARTHROSCOPY KNEE;  Surgeon: Loreta Ave, MD;  Location: [facility] OR;  Service: Orthopedics;  Laterality: Right;   LAPAROSCOPIC CHOLECYSTECTOMY     LASIK     OOPHORECTOMY     PALATE TO GINGIVA GRAFT  2017   REVERSE SHOULDER ARTHROPLASTY Left 02/13/2021   Procedure: REVERSE SHOULDER ARTHROPLASTY;  Surgeon: Teryl Lucy, MD;  Location: [facility];  Service: Orthopedics;  Laterality: Left;   RIGHT/LEFT HEART CATH AND CORONARY ANGIOGRAPHY N/A 08/02/2016   Procedure: Right/Left Heart Cath and Coronary Angiography;  Surgeon: Tonny Bollman, MD;  Location: [facility] LAB;  Service: Cardiovascular;

## 2023-04-17 ENCOUNTER — Encounter: Payer: Self-pay | Admitting: Family Medicine

## 2023-04-17 ENCOUNTER — Ambulatory Visit: Payer: Medicare PPO | Admitting: Physical Therapy

## 2023-04-17 DIAGNOSIS — M25511 Pain in right shoulder: Secondary | ICD-10-CM | POA: Diagnosis not present

## 2023-04-17 DIAGNOSIS — R600 Localized edema: Secondary | ICD-10-CM

## 2023-04-17 DIAGNOSIS — Z96612 Presence of left artificial shoulder joint: Secondary | ICD-10-CM | POA: Diagnosis not present

## 2023-04-17 DIAGNOSIS — M25611 Stiffness of right shoulder, not elsewhere classified: Secondary | ICD-10-CM

## 2023-04-17 DIAGNOSIS — M6281 Muscle weakness (generalized): Secondary | ICD-10-CM

## 2023-04-17 DIAGNOSIS — G8918 Other acute postprocedural pain: Secondary | ICD-10-CM | POA: Diagnosis not present

## 2023-04-17 NOTE — Therapy (Signed)
OUTPATIENT PHYSICAL THERAPY SHOULDER   Progress Note Reporting Period 03/17/23 to 04/17/23  See note below for Objective Data and Assessment of Progress/Goals.    Patient Name: [name] MRN: [id number] DOB:February 18, 1951, 72 y.o., female Today's Date: 04/17/2023  END OF SESSION:  PT End of Session - 04/17/23 1430     Visit Number 20    Date for PT Re-Evaluation 05/05/23    PT Start Time 1430    PT Stop Time 1530    PT Time Calculation (min) 60 min            Past Medical History:  Diagnosis Date   Allergy    Anxiety    Arthritis    back & knee   Asthma     mild per pt shows up with resp illness   Colon polyps    Constipation    Diabetes mellitus without complication (HCC)    Gallstones    Gastric polyps    Gastroparesis    GERD (gastroesophageal reflux disease)    Headache    sinus headaches and migraines at times   Heart murmur    History of migraine headaches    HTN (hypertension)    Hyperlipidemia    IBS (irritable bowel syndrome)    Joint pain    Lumbar disc disease    PONV (postoperative nausea and vomiting)    S/P aortic valve replacement with bioprosthetic valve 08/23/2016   25 mm Edwards Intuity rapid-deployment bovine pericardial tissue valve via partial upper mini sternotomy   Sleep apnea    does not use CPAP   TIA (transient ischemic attack)    TIA (transient ischemic attack)    hx of per pt   Past Surgical History:  Procedure Laterality Date   ABDOMINAL HYSTERECTOMY     AORTIC VALVE REPLACEMENT N/A 08/23/2016   Procedure: AORTIC VALVE REPLACEMENT (AVR) - using partial Upper Sternotomy- 25mm Edwards Intuity Aortic Valve used;  Surgeon: Purcell Nails, MD;  Location: [facility];  Service: Open Heart Surgery;  Laterality: N/A;   BLADDER SUSPENSION  2007   BREAST ENHANCEMENT SURGERY  1980   CARPAL TUNNEL RELEASE Bilateral    COLONOSCOPY     DORSAL COMPARTMENT RELEASE Right 09/15/2014   Procedure: RIGHT WRIST DEQUERVAINS RELEASE ;  Surgeon:  Mckinley Jewel, MD;  Location: [facility];  Service: Orthopedics;  Laterality: Right;   DRUG INDUCED ENDOSCOPY N/A 02/19/2022   Procedure: DRUG INDUCED SLEEP ENDOSCOPY;  Surgeon: Osborn Coho, MD;  Location: [facility];  Service: ENT;  Laterality: N/A;   ESOPHAGOGASTRODUODENOSCOPY     EYE SURGERY     cataract surgery   IMPLANTATION OF HYPOGLOSSAL NERVE STIMULATOR Right 04/23/2022   Procedure: IMPLANTATION OF HYPOGLOSSAL NERVE STIMULATOR;  Surgeon: Osborn Coho, MD;  Location: [facility];  Service: ENT;  Laterality: Right;   KNEE ARTHROSCOPY  04/15/2012   Procedure: ARTHROSCOPY KNEE;  Surgeon: Loreta Ave, MD;  Location: [facility] OR;  Service: Orthopedics;  Laterality: Right;   LAPAROSCOPIC CHOLECYSTECTOMY     LASIK     OOPHORECTOMY     PALATE TO GINGIVA GRAFT  2017   REVERSE SHOULDER ARTHROPLASTY Left 02/13/2021   Procedure: REVERSE SHOULDER ARTHROPLASTY;  Surgeon: Teryl Lucy, MD;  Location: [facility];  Service: Orthopedics;  Laterality: Left;   RIGHT/LEFT HEART CATH AND CORONARY ANGIOGRAPHY N/A 08/02/2016   Procedure: Right/Left Heart Cath and Coronary Angiography;  Surgeon: Tonny Bollman, MD;  Location: [facility] INVASIVE CV LAB;  Service:  OUTPATIENT PHYSICAL THERAPY SHOULDER   Progress Note Reporting Period 03/17/23 to 04/17/23  See note below for Objective Data and Assessment of Progress/Goals.    Patient Name: [name] MRN: [id number] DOB:February 18, 1951, 72 y.o., female Today's Date: 04/17/2023  END OF SESSION:  PT End of Session - 04/17/23 1430     Visit Number 20    Date for PT Re-Evaluation 05/05/23    PT Start Time 1430    PT Stop Time 1530    PT Time Calculation (min) 60 min            Past Medical History:  Diagnosis Date   Allergy    Anxiety    Arthritis    back & knee   Asthma     mild per pt shows up with resp illness   Colon polyps    Constipation    Diabetes mellitus without complication (HCC)    Gallstones    Gastric polyps    Gastroparesis    GERD (gastroesophageal reflux disease)    Headache    sinus headaches and migraines at times   Heart murmur    History of migraine headaches    HTN (hypertension)    Hyperlipidemia    IBS (irritable bowel syndrome)    Joint pain    Lumbar disc disease    PONV (postoperative nausea and vomiting)    S/P aortic valve replacement with bioprosthetic valve 08/23/2016   25 mm Edwards Intuity rapid-deployment bovine pericardial tissue valve via partial upper mini sternotomy   Sleep apnea    does not use CPAP   TIA (transient ischemic attack)    TIA (transient ischemic attack)    hx of per pt   Past Surgical History:  Procedure Laterality Date   ABDOMINAL HYSTERECTOMY     AORTIC VALVE REPLACEMENT N/A 08/23/2016   Procedure: AORTIC VALVE REPLACEMENT (AVR) - using partial Upper Sternotomy- 25mm Edwards Intuity Aortic Valve used;  Surgeon: Purcell Nails, MD;  Location: [facility];  Service: Open Heart Surgery;  Laterality: N/A;   BLADDER SUSPENSION  2007   BREAST ENHANCEMENT SURGERY  1980   CARPAL TUNNEL RELEASE Bilateral    COLONOSCOPY     DORSAL COMPARTMENT RELEASE Right 09/15/2014   Procedure: RIGHT WRIST DEQUERVAINS RELEASE ;  Surgeon:  Mckinley Jewel, MD;  Location: [facility];  Service: Orthopedics;  Laterality: Right;   DRUG INDUCED ENDOSCOPY N/A 02/19/2022   Procedure: DRUG INDUCED SLEEP ENDOSCOPY;  Surgeon: Osborn Coho, MD;  Location: [facility];  Service: ENT;  Laterality: N/A;   ESOPHAGOGASTRODUODENOSCOPY     EYE SURGERY     cataract surgery   IMPLANTATION OF HYPOGLOSSAL NERVE STIMULATOR Right 04/23/2022   Procedure: IMPLANTATION OF HYPOGLOSSAL NERVE STIMULATOR;  Surgeon: Osborn Coho, MD;  Location: [facility];  Service: ENT;  Laterality: Right;   KNEE ARTHROSCOPY  04/15/2012   Procedure: ARTHROSCOPY KNEE;  Surgeon: Loreta Ave, MD;  Location: [facility] OR;  Service: Orthopedics;  Laterality: Right;   LAPAROSCOPIC CHOLECYSTECTOMY     LASIK     OOPHORECTOMY     PALATE TO GINGIVA GRAFT  2017   REVERSE SHOULDER ARTHROPLASTY Left 02/13/2021   Procedure: REVERSE SHOULDER ARTHROPLASTY;  Surgeon: Teryl Lucy, MD;  Location: [facility];  Service: Orthopedics;  Laterality: Left;   RIGHT/LEFT HEART CATH AND CORONARY ANGIOGRAPHY N/A 08/02/2016   Procedure: Right/Left Heart Cath and Coronary Angiography;  Surgeon: Tonny Bollman, MD;  Location: [facility] INVASIVE CV LAB;  Service:  standing row 2 sets 10 10# Seated chest press with serratus 2 sets 10 Lat Pull with stretch 2 sets 10 STM to R UT, cervical paraspinals, with cervical distraction, scapular mobs and passive stretch into depression, flex, abd, ER VASO to R shoulder for pain and inflammation control x 10 min, med pressure.  03/17/23 NuStep using arms and legs L5 x 6 minutes STM to R UT, cervical paraspinals, with cervical distraction, scapular mobs and passive stretch into depression, flex, abd, ER Seated lat pulls with passive stretch, 15#, 2 x 10 Standing row with rope for more narrow grip, 2 x 10, 10# Standing overhead flex and ext facing and then with back to wall, 3# weight x 10 each. Unable to quite reach the wall when facing away. Body Blade in 90 felx and abd, horiz and vert, 15 sec each. VASO to R shoulder for pain and inflammation control x 10 min, med pressure.  PATIENT EDUCATION: Education details: POC Person educated: Patient Education method: Explanation Education comprehension: verbalized understanding  HOME EXERCISE PROGRAM: [id number]  ASSESSMENT:  CLINICAL IMPRESSION:   Functionally pt reports 8% better. C/o pain and weakness with certain mvmts. Assessed ROM and MMT and he she is making good func gains. Continued with progression of strengthening exercises, along with STM and stretch. She tolerated all activities with mild soreness, but able to fully complete. Pt will continue to benefit from skilled interventions to maximize func use and strength.  OBJECTIVE IMPAIRMENTS: decreased activity tolerance, decreased balance, decreased coordination, decreased ROM, decreased strength, impaired flexibility, impaired UE functional use, improper body mechanics, postural dysfunction, and pain.   ACTIVITY LIMITATIONS: carrying, lifting, sleeping, and reach over head  PARTICIPATION LIMITATIONS: meal prep, cleaning, laundry, driving, shopping, and community activity  PERSONAL FACTORS: Past/current  experiences are also affecting patient's functional outcome.   REHAB POTENTIAL: Good  CLINICAL DECISION MAKING: Evolving/moderate complexity  EVALUATION COMPLEXITY: Moderate   GOALS: Goals reviewed with patient? Yes  SHORT TERM GOALS: Target date: 02/28/23  I with initial HEP Baseline: Goal status: 02/13/23 MET  LONG TERM GOALS: Target date: 05/05/23  I with final HEP Baseline:  Goal status: 03/10/23-ongoing  03/20/23 evolving  2.  Patient will recover at least 120 degrees of active R shoulder flexion and abduction Baseline: See ROM Goal status: 03/10/23 progressing  03/20/23 ongoing, 04/07/23, met for flex, ongoing for abd   04/17/23 MET  3.  Increase RUE shoulder strength to at least 4/5 Baseline: 3-/5 Goal status: 03/17/23-flex 3+/5, Abd 3/5, 04/07/23- 4-/5 ongoing   04/17/23  met except ER 4-/5  4.  Patient will report the ability to perform all of her normal daily activities with RU. Baseline: Severely limited functionally Goal status: 04/07/23-met  5.  Decrease R shoulder pain to < 2/10 with all activity. Baseline: 4/10 Goal status: 04/07/23-3/10 during exercises, ongoing  PLAN:  PT FREQUENCY: 1-2x/week  PT DURATION: 12 weeks  PLANNED INTERVENTIONS: Therapeutic exercises, Therapeutic activity, Neuromuscular re-education, Balance training, Gait training, Patient/Family education, Self Care, Joint mobilization, Dry Needling, Electrical stimulation, Cryotherapy, Moist heat, Vasopneumatic device, Ionotophoresis 4mg /ml Dexamethasone, and Manual therapy  PLAN FOR NEXT SESSION: progress srtengthening  Patient Details  Name: [name] MRN: [id number] Date of Birth: Oct 08, 1950 Referring Provider:  Shelva Majestic, MD  Encounter Date: 04/17/2023  Oley Balm DPT 04/17/23 3:56 PM  Belmont Valli,ANGIE, PTA 04/17/2023, 2:31 PM  [facility] [address] [place], [place], [address] Phone: [phone]    Fax:  [phone]  OUTPATIENT PHYSICAL THERAPY SHOULDER   Progress Note Reporting Period 03/17/23 to 04/17/23  See note below for Objective Data and Assessment of Progress/Goals.    Patient Name: [name] MRN: [id number] DOB:February 18, 1951, 72 y.o., female Today's Date: 04/17/2023  END OF SESSION:  PT End of Session - 04/17/23 1430     Visit Number 20    Date for PT Re-Evaluation 05/05/23    PT Start Time 1430    PT Stop Time 1530    PT Time Calculation (min) 60 min            Past Medical History:  Diagnosis Date   Allergy    Anxiety    Arthritis    back & knee   Asthma     mild per pt shows up with resp illness   Colon polyps    Constipation    Diabetes mellitus without complication (HCC)    Gallstones    Gastric polyps    Gastroparesis    GERD (gastroesophageal reflux disease)    Headache    sinus headaches and migraines at times   Heart murmur    History of migraine headaches    HTN (hypertension)    Hyperlipidemia    IBS (irritable bowel syndrome)    Joint pain    Lumbar disc disease    PONV (postoperative nausea and vomiting)    S/P aortic valve replacement with bioprosthetic valve 08/23/2016   25 mm Edwards Intuity rapid-deployment bovine pericardial tissue valve via partial upper mini sternotomy   Sleep apnea    does not use CPAP   TIA (transient ischemic attack)    TIA (transient ischemic attack)    hx of per pt   Past Surgical History:  Procedure Laterality Date   ABDOMINAL HYSTERECTOMY     AORTIC VALVE REPLACEMENT N/A 08/23/2016   Procedure: AORTIC VALVE REPLACEMENT (AVR) - using partial Upper Sternotomy- 25mm Edwards Intuity Aortic Valve used;  Surgeon: Purcell Nails, MD;  Location: [facility];  Service: Open Heart Surgery;  Laterality: N/A;   BLADDER SUSPENSION  2007   BREAST ENHANCEMENT SURGERY  1980   CARPAL TUNNEL RELEASE Bilateral    COLONOSCOPY     DORSAL COMPARTMENT RELEASE Right 09/15/2014   Procedure: RIGHT WRIST DEQUERVAINS RELEASE ;  Surgeon:  Mckinley Jewel, MD;  Location: [facility];  Service: Orthopedics;  Laterality: Right;   DRUG INDUCED ENDOSCOPY N/A 02/19/2022   Procedure: DRUG INDUCED SLEEP ENDOSCOPY;  Surgeon: Osborn Coho, MD;  Location: [facility];  Service: ENT;  Laterality: N/A;   ESOPHAGOGASTRODUODENOSCOPY     EYE SURGERY     cataract surgery   IMPLANTATION OF HYPOGLOSSAL NERVE STIMULATOR Right 04/23/2022   Procedure: IMPLANTATION OF HYPOGLOSSAL NERVE STIMULATOR;  Surgeon: Osborn Coho, MD;  Location: [facility];  Service: ENT;  Laterality: Right;   KNEE ARTHROSCOPY  04/15/2012   Procedure: ARTHROSCOPY KNEE;  Surgeon: Loreta Ave, MD;  Location: [facility] OR;  Service: Orthopedics;  Laterality: Right;   LAPAROSCOPIC CHOLECYSTECTOMY     LASIK     OOPHORECTOMY     PALATE TO GINGIVA GRAFT  2017   REVERSE SHOULDER ARTHROPLASTY Left 02/13/2021   Procedure: REVERSE SHOULDER ARTHROPLASTY;  Surgeon: Teryl Lucy, MD;  Location: [facility];  Service: Orthopedics;  Laterality: Left;   RIGHT/LEFT HEART CATH AND CORONARY ANGIOGRAPHY N/A 08/02/2016   Procedure: Right/Left Heart Cath and Coronary Angiography;  Surgeon: Tonny Bollman, MD;  Location: [facility] LAB;  Service:  standing row 2 sets 10 10# Seated chest press with serratus 2 sets 10 Lat Pull with stretch 2 sets 10 STM to R UT, cervical paraspinals, with cervical distraction, scapular mobs and passive stretch into depression, flex, abd, ER VASO to R shoulder for pain and inflammation control x 10 min, med pressure.  03/17/23 NuStep using arms and legs L5 x 6 minutes STM to R UT, cervical paraspinals, with cervical distraction, scapular mobs and passive stretch into depression, flex, abd, ER Seated lat pulls with passive stretch, 15#, 2 x 10 Standing row with rope for more narrow grip, 2 x 10, 10# Standing overhead flex and ext facing and then with back to wall, 3# weight x 10 each. Unable to quite reach the wall when facing away. Body Blade in 90 felx and abd, horiz and vert, 15 sec each. VASO to R shoulder for pain and inflammation control x 10 min, med pressure.  PATIENT EDUCATION: Education details: POC Person educated: Patient Education method: Explanation Education comprehension: verbalized understanding  HOME EXERCISE PROGRAM: [id number]  ASSESSMENT:  CLINICAL IMPRESSION:   Functionally pt reports 8% better. C/o pain and weakness with certain mvmts. Assessed ROM and MMT and he she is making good func gains. Continued with progression of strengthening exercises, along with STM and stretch. She tolerated all activities with mild soreness, but able to fully complete. Pt will continue to benefit from skilled interventions to maximize func use and strength.  OBJECTIVE IMPAIRMENTS: decreased activity tolerance, decreased balance, decreased coordination, decreased ROM, decreased strength, impaired flexibility, impaired UE functional use, improper body mechanics, postural dysfunction, and pain.   ACTIVITY LIMITATIONS: carrying, lifting, sleeping, and reach over head  PARTICIPATION LIMITATIONS: meal prep, cleaning, laundry, driving, shopping, and community activity  PERSONAL FACTORS: Past/current  experiences are also affecting patient's functional outcome.   REHAB POTENTIAL: Good  CLINICAL DECISION MAKING: Evolving/moderate complexity  EVALUATION COMPLEXITY: Moderate   GOALS: Goals reviewed with patient? Yes  SHORT TERM GOALS: Target date: 02/28/23  I with initial HEP Baseline: Goal status: 02/13/23 MET  LONG TERM GOALS: Target date: 05/05/23  I with final HEP Baseline:  Goal status: 03/10/23-ongoing  03/20/23 evolving  2.  Patient will recover at least 120 degrees of active R shoulder flexion and abduction Baseline: See ROM Goal status: 03/10/23 progressing  03/20/23 ongoing, 04/07/23, met for flex, ongoing for abd   04/17/23 MET  3.  Increase RUE shoulder strength to at least 4/5 Baseline: 3-/5 Goal status: 03/17/23-flex 3+/5, Abd 3/5, 04/07/23- 4-/5 ongoing   04/17/23  met except ER 4-/5  4.  Patient will report the ability to perform all of her normal daily activities with RU. Baseline: Severely limited functionally Goal status: 04/07/23-met  5.  Decrease R shoulder pain to < 2/10 with all activity. Baseline: 4/10 Goal status: 04/07/23-3/10 during exercises, ongoing  PLAN:  PT FREQUENCY: 1-2x/week  PT DURATION: 12 weeks  PLANNED INTERVENTIONS: Therapeutic exercises, Therapeutic activity, Neuromuscular re-education, Balance training, Gait training, Patient/Family education, Self Care, Joint mobilization, Dry Needling, Electrical stimulation, Cryotherapy, Moist heat, Vasopneumatic device, Ionotophoresis 4mg /ml Dexamethasone, and Manual therapy  PLAN FOR NEXT SESSION: progress srtengthening  Patient Details  Name: [name] MRN: [id number] Date of Birth: Oct 08, 1950 Referring Provider:  Shelva Majestic, MD  Encounter Date: 04/17/2023  Oley Balm DPT 04/17/23 3:56 PM  [place] Valli,ANGIE, PTA 04/17/2023, 2:31 PM  [facility] [address] [facility]. [place], [place], [address] Phone: [phone]    Fax:  [phone]

## 2023-04-22 ENCOUNTER — Ambulatory Visit: Payer: Medicare PPO | Admitting: Physical Therapy

## 2023-04-22 DIAGNOSIS — G8918 Other acute postprocedural pain: Secondary | ICD-10-CM | POA: Diagnosis not present

## 2023-04-22 DIAGNOSIS — Z96612 Presence of left artificial shoulder joint: Secondary | ICD-10-CM

## 2023-04-22 DIAGNOSIS — R600 Localized edema: Secondary | ICD-10-CM | POA: Diagnosis not present

## 2023-04-22 DIAGNOSIS — M25511 Pain in right shoulder: Secondary | ICD-10-CM | POA: Diagnosis not present

## 2023-04-22 DIAGNOSIS — M6281 Muscle weakness (generalized): Secondary | ICD-10-CM

## 2023-04-22 DIAGNOSIS — M25611 Stiffness of right shoulder, not elsewhere classified: Secondary | ICD-10-CM | POA: Diagnosis not present

## 2023-04-22 NOTE — Therapy (Signed)
of Session  flex 165  abd 142  ER 80  IR 80 Nustep L 5 Cable pulley IR and ER 2  sets 10 Standing Red Tband flex and abd 2 sets 10 Cable pulleys standing row 2 sets 10 10# Seated chest press with serratus 2 sets 10 Lat Pull with stretch 2 sets 10 STM to R UT, cervical paraspinals, with cervical distraction, scapular mobs and passive stretch into depression, flex, abd, ER VASO to R shoulder for pain and inflammation control x 10 min, med pressure.  03/17/23 NuStep using arms and legs L5 x 6 minutes STM to R UT, cervical paraspinals, with cervical distraction, scapular mobs and passive stretch into depression, flex, abd, ER Seated lat pulls with passive stretch, 15#, 2 x 10 Standing row with rope for more narrow grip, 2 x 10, 10# Standing overhead flex and ext facing and then with back to wall, 3# weight x 10 each. Unable to quite reach the wall when facing away. Body Blade in 90 felx and abd, horiz and vert, 15 sec each. VASO to R shoulder for pain and inflammation control x 10 min, med pressure.  PATIENT EDUCATION: Education details: POC Person educated: Patient Education method: Explanation Education comprehension: verbalized understanding  HOME EXERCISE PROGRAM: [id number]  ASSESSMENT:  CLINICAL IMPRESSION:   Progressed strength and func. Pt will continue to benefit from skilled interventions to maximize func use and strength.  OBJECTIVE IMPAIRMENTS: decreased activity tolerance, decreased balance, decreased coordination, decreased ROM, decreased strength, impaired flexibility, impaired UE functional use, improper body mechanics, postural dysfunction, and pain.   ACTIVITY LIMITATIONS: carrying, lifting, sleeping, and reach over head  PARTICIPATION LIMITATIONS: meal prep, cleaning, laundry, driving, shopping, and community activity  PERSONAL FACTORS: Past/current experiences are also affecting patient's functional outcome.   REHAB POTENTIAL: Good  CLINICAL DECISION MAKING: Evolving/moderate complexity  EVALUATION COMPLEXITY: Moderate   GOALS: Goals  reviewed with patient? Yes  SHORT TERM GOALS: Target date: 02/28/23  I with initial HEP Baseline: Goal status: 02/13/23 MET  LONG TERM GOALS: Target date: 05/05/23  I with final HEP Baseline:  Goal status: 03/10/23-ongoing  03/20/23 evolving  2.  Patient will recover at least 120 degrees of active R shoulder flexion and abduction Baseline: See ROM Goal status: 03/10/23 progressing  03/20/23 ongoing, 04/07/23, met for flex, ongoing for abd   04/17/23 MET  3.  Increase RUE shoulder strength to at least 4/5 Baseline: 3-/5 Goal status: 03/17/23-flex 3+/5, Abd 3/5, 04/07/23- 4-/5 ongoing   04/17/23  met except ER 4-/5  4.  Patient will report the ability to perform all of her normal daily activities with RU. Baseline: Severely limited functionally Goal status: 04/07/23-met  5.  Decrease R shoulder pain to < 2/10 with all activity. Baseline: 4/10 Goal status: 04/07/23-3/10 during exercises, ongoing  PLAN:  PT FREQUENCY: 1-2x/week  PT DURATION: 12 weeks  PLANNED INTERVENTIONS: Therapeutic exercises, Therapeutic activity, Neuromuscular re-education, Balance training, Gait training, Patient/Family education, Self Care, Joint mobilization, Dry Needling, Electrical stimulation, Cryotherapy, Moist heat, Vasopneumatic device, Ionotophoresis 4mg /ml Dexamethasone, and Manual therapy  PLAN FOR NEXT SESSION: progress srtengthening  Patient Details  Name: [name] MRN: [id number] Date of Birth: [birth year] Referring Provider:  Shelva Majestic, MD  Encounter Date: 04/22/2023  Suanne Marker, PTA 04/22/2023, 2:47 PM  [facility] [address] [place], [place], [address]  of Session  flex 165  abd 142  ER 80  IR 80 Nustep L 5 Cable pulley IR and ER 2  sets 10 Standing Red Tband flex and abd 2 sets 10 Cable pulleys standing row 2 sets 10 10# Seated chest press with serratus 2 sets 10 Lat Pull with stretch 2 sets 10 STM to R UT, cervical paraspinals, with cervical distraction, scapular mobs and passive stretch into depression, flex, abd, ER VASO to R shoulder for pain and inflammation control x 10 min, med pressure.  03/17/23 NuStep using arms and legs L5 x 6 minutes STM to R UT, cervical paraspinals, with cervical distraction, scapular mobs and passive stretch into depression, flex, abd, ER Seated lat pulls with passive stretch, 15#, 2 x 10 Standing row with rope for more narrow grip, 2 x 10, 10# Standing overhead flex and ext facing and then with back to wall, 3# weight x 10 each. Unable to quite reach the wall when facing away. Body Blade in 90 felx and abd, horiz and vert, 15 sec each. VASO to R shoulder for pain and inflammation control x 10 min, med pressure.  PATIENT EDUCATION: Education details: POC Person educated: Patient Education method: Explanation Education comprehension: verbalized understanding  HOME EXERCISE PROGRAM: [id number]  ASSESSMENT:  CLINICAL IMPRESSION:   Progressed strength and func. Pt will continue to benefit from skilled interventions to maximize func use and strength.  OBJECTIVE IMPAIRMENTS: decreased activity tolerance, decreased balance, decreased coordination, decreased ROM, decreased strength, impaired flexibility, impaired UE functional use, improper body mechanics, postural dysfunction, and pain.   ACTIVITY LIMITATIONS: carrying, lifting, sleeping, and reach over head  PARTICIPATION LIMITATIONS: meal prep, cleaning, laundry, driving, shopping, and community activity  PERSONAL FACTORS: Past/current experiences are also affecting patient's functional outcome.   REHAB POTENTIAL: Good  CLINICAL DECISION MAKING: Evolving/moderate complexity  EVALUATION COMPLEXITY: Moderate   GOALS: Goals  reviewed with patient? Yes  SHORT TERM GOALS: Target date: 02/28/23  I with initial HEP Baseline: Goal status: 02/13/23 MET  LONG TERM GOALS: Target date: 05/05/23  I with final HEP Baseline:  Goal status: 03/10/23-ongoing  03/20/23 evolving  2.  Patient will recover at least 120 degrees of active R shoulder flexion and abduction Baseline: See ROM Goal status: 03/10/23 progressing  03/20/23 ongoing, 04/07/23, met for flex, ongoing for abd   04/17/23 MET  3.  Increase RUE shoulder strength to at least 4/5 Baseline: 3-/5 Goal status: 03/17/23-flex 3+/5, Abd 3/5, 04/07/23- 4-/5 ongoing   04/17/23  met except ER 4-/5  4.  Patient will report the ability to perform all of her normal daily activities with RU. Baseline: Severely limited functionally Goal status: 04/07/23-met  5.  Decrease R shoulder pain to < 2/10 with all activity. Baseline: 4/10 Goal status: 04/07/23-3/10 during exercises, ongoing  PLAN:  PT FREQUENCY: 1-2x/week  PT DURATION: 12 weeks  PLANNED INTERVENTIONS: Therapeutic exercises, Therapeutic activity, Neuromuscular re-education, Balance training, Gait training, Patient/Family education, Self Care, Joint mobilization, Dry Needling, Electrical stimulation, Cryotherapy, Moist heat, Vasopneumatic device, Ionotophoresis 4mg /ml Dexamethasone, and Manual therapy  PLAN FOR NEXT SESSION: progress srtengthening  Patient Details  Name: [name] MRN: [id number] Date of Birth: [birth year] Referring Provider:  Shelva Majestic, MD  Encounter Date: 04/22/2023  Suanne Marker, PTA 04/22/2023, 2:47 PM  [facility] [address] [place], [place], [address]  OUTPATIENT PHYSICAL THERAPY SHOULDER     Patient Name: [name] MRN: [id number] DOB:1950/11/20, 72 y.o., female Today's Date: 04/22/2023  END OF SESSION:  PT End of Session - 04/22/23 1445     Visit Number 21    Date for PT Re-Evaluation 05/05/23    PT Start Time 1440    PT Stop Time 1530    PT Time Calculation (min) 50 min            Past Medical History:  Diagnosis Date   Allergy    Anxiety    Arthritis    back & knee   Asthma     mild per pt shows up with resp illness   Colon polyps    Constipation    Diabetes mellitus without complication (HCC)    Gallstones    Gastric polyps    Gastroparesis    GERD (gastroesophageal reflux disease)    Headache    sinus headaches and migraines at times   Heart murmur    History of migraine headaches    HTN (hypertension)    Hyperlipidemia    IBS (irritable bowel syndrome)    Joint pain    Lumbar disc disease    PONV (postoperative nausea and vomiting)    S/P aortic valve replacement with bioprosthetic valve 08/23/2016   25 mm Edwards Intuity rapid-deployment bovine pericardial tissue valve via partial upper mini sternotomy   Sleep apnea    does not use CPAP   TIA (transient ischemic attack)    TIA (transient ischemic attack)    hx of per pt   Past Surgical History:  Procedure Laterality Date   ABDOMINAL HYSTERECTOMY     AORTIC VALVE REPLACEMENT N/A 08/23/2016   Procedure: AORTIC VALVE REPLACEMENT (AVR) - using partial Upper Sternotomy- 25mm Edwards Intuity Aortic Valve used;  Surgeon: Purcell Nails, MD;  Location: [facility] OR;  Service: Open Heart Surgery;  Laterality: N/A;   BLADDER SUSPENSION  2007   BREAST ENHANCEMENT SURGERY  1980   CARPAL TUNNEL RELEASE Bilateral    COLONOSCOPY     DORSAL COMPARTMENT RELEASE Right 09/15/2014   Procedure: RIGHT WRIST DEQUERVAINS RELEASE ;  Surgeon: Mckinley Jewel, MD;  Location: [facility];  Service: Orthopedics;  Laterality: Right;   DRUG INDUCED ENDOSCOPY  N/A 02/19/2022   Procedure: DRUG INDUCED SLEEP ENDOSCOPY;  Surgeon: Osborn Coho, MD;  Location: [facility];  Service: ENT;  Laterality: N/A;   ESOPHAGOGASTRODUODENOSCOPY     EYE SURGERY     cataract surgery   IMPLANTATION OF HYPOGLOSSAL NERVE STIMULATOR Right 04/23/2022   Procedure: IMPLANTATION OF HYPOGLOSSAL NERVE STIMULATOR;  Surgeon: Osborn Coho, MD;  Location: [facility];  Service: ENT;  Laterality: Right;   KNEE ARTHROSCOPY  04/15/2012   Procedure: ARTHROSCOPY KNEE;  Surgeon: Loreta Ave, MD;  Location: [facility] OR;  Service: Orthopedics;  Laterality: Right;   LAPAROSCOPIC CHOLECYSTECTOMY     LASIK     OOPHORECTOMY     PALATE TO GINGIVA GRAFT  2017   REVERSE SHOULDER ARTHROPLASTY Left 02/13/2021   Procedure: REVERSE SHOULDER ARTHROPLASTY;  Surgeon: Teryl Lucy, MD;  Location: [facility];  Service: Orthopedics;  Laterality: Left;   RIGHT/LEFT HEART CATH AND CORONARY ANGIOGRAPHY N/A 08/02/2016   Procedure: Right/Left Heart Cath and Coronary Angiography;  Surgeon: Tonny Bollman, MD;  Location: [facility] INVASIVE CV LAB;  Service: Cardiovascular;  Laterality: N/A;   ROTATOR CUFF REPAIR Left    SHOULDER ARTHROSCOPY WITH ROTATOR CUFF REPAIR  of Session  flex 165  abd 142  ER 80  IR 80 Nustep L 5 Cable pulley IR and ER 2  sets 10 Standing Red Tband flex and abd 2 sets 10 Cable pulleys standing row 2 sets 10 10# Seated chest press with serratus 2 sets 10 Lat Pull with stretch 2 sets 10 STM to R UT, cervical paraspinals, with cervical distraction, scapular mobs and passive stretch into depression, flex, abd, ER VASO to R shoulder for pain and inflammation control x 10 min, med pressure.  03/17/23 NuStep using arms and legs L5 x 6 minutes STM to R UT, cervical paraspinals, with cervical distraction, scapular mobs and passive stretch into depression, flex, abd, ER Seated lat pulls with passive stretch, 15#, 2 x 10 Standing row with rope for more narrow grip, 2 x 10, 10# Standing overhead flex and ext facing and then with back to wall, 3# weight x 10 each. Unable to quite reach the wall when facing away. Body Blade in 90 felx and abd, horiz and vert, 15 sec each. VASO to R shoulder for pain and inflammation control x 10 min, med pressure.  PATIENT EDUCATION: Education details: POC Person educated: Patient Education method: Explanation Education comprehension: verbalized understanding  HOME EXERCISE PROGRAM: [id number]  ASSESSMENT:  CLINICAL IMPRESSION:   Progressed strength and func. Pt will continue to benefit from skilled interventions to maximize func use and strength.  OBJECTIVE IMPAIRMENTS: decreased activity tolerance, decreased balance, decreased coordination, decreased ROM, decreased strength, impaired flexibility, impaired UE functional use, improper body mechanics, postural dysfunction, and pain.   ACTIVITY LIMITATIONS: carrying, lifting, sleeping, and reach over head  PARTICIPATION LIMITATIONS: meal prep, cleaning, laundry, driving, shopping, and community activity  PERSONAL FACTORS: Past/current experiences are also affecting patient's functional outcome.   REHAB POTENTIAL: Good  CLINICAL DECISION MAKING: Evolving/moderate complexity  EVALUATION COMPLEXITY: Moderate   GOALS: Goals  reviewed with patient? Yes  SHORT TERM GOALS: Target date: 02/28/23  I with initial HEP Baseline: Goal status: 02/13/23 MET  LONG TERM GOALS: Target date: 05/05/23  I with final HEP Baseline:  Goal status: 03/10/23-ongoing  03/20/23 evolving  2.  Patient will recover at least 120 degrees of active R shoulder flexion and abduction Baseline: See ROM Goal status: 03/10/23 progressing  03/20/23 ongoing, 04/07/23, met for flex, ongoing for abd   04/17/23 MET  3.  Increase RUE shoulder strength to at least 4/5 Baseline: 3-/5 Goal status: 03/17/23-flex 3+/5, Abd 3/5, 04/07/23- 4-/5 ongoing   04/17/23  met except ER 4-/5  4.  Patient will report the ability to perform all of her normal daily activities with RU. Baseline: Severely limited functionally Goal status: 04/07/23-met  5.  Decrease R shoulder pain to < 2/10 with all activity. Baseline: 4/10 Goal status: 04/07/23-3/10 during exercises, ongoing  PLAN:  PT FREQUENCY: 1-2x/week  PT DURATION: 12 weeks  PLANNED INTERVENTIONS: Therapeutic exercises, Therapeutic activity, Neuromuscular re-education, Balance training, Gait training, Patient/Family education, Self Care, Joint mobilization, Dry Needling, Electrical stimulation, Cryotherapy, Moist heat, Vasopneumatic device, Ionotophoresis 4mg /ml Dexamethasone, and Manual therapy  PLAN FOR NEXT SESSION: progress srtengthening  Patient Details  Name: [name] MRN: [id number] Date of Birth: [birth year] Referring Provider:  Shelva Majestic, MD  Encounter Date: 04/22/2023  Suanne Marker, PTA 04/22/2023, 2:47 PM  [facility] [address] [facility] [address] [place], [place], [address]  of Session  flex 165  abd 142  ER 80  IR 80 Nustep L 5 Cable pulley IR and ER 2  sets 10 Standing Red Tband flex and abd 2 sets 10 Cable pulleys standing row 2 sets 10 10# Seated chest press with serratus 2 sets 10 Lat Pull with stretch 2 sets 10 STM to R UT, cervical paraspinals, with cervical distraction, scapular mobs and passive stretch into depression, flex, abd, ER VASO to R shoulder for pain and inflammation control x 10 min, med pressure.  03/17/23 NuStep using arms and legs L5 x 6 minutes STM to R UT, cervical paraspinals, with cervical distraction, scapular mobs and passive stretch into depression, flex, abd, ER Seated lat pulls with passive stretch, 15#, 2 x 10 Standing row with rope for more narrow grip, 2 x 10, 10# Standing overhead flex and ext facing and then with back to wall, 3# weight x 10 each. Unable to quite reach the wall when facing away. Body Blade in 90 felx and abd, horiz and vert, 15 sec each. VASO to R shoulder for pain and inflammation control x 10 min, med pressure.  PATIENT EDUCATION: Education details: POC Person educated: Patient Education method: Explanation Education comprehension: verbalized understanding  HOME EXERCISE PROGRAM: [id number]  ASSESSMENT:  CLINICAL IMPRESSION:   Progressed strength and func. Pt will continue to benefit from skilled interventions to maximize func use and strength.  OBJECTIVE IMPAIRMENTS: decreased activity tolerance, decreased balance, decreased coordination, decreased ROM, decreased strength, impaired flexibility, impaired UE functional use, improper body mechanics, postural dysfunction, and pain.   ACTIVITY LIMITATIONS: carrying, lifting, sleeping, and reach over head  PARTICIPATION LIMITATIONS: meal prep, cleaning, laundry, driving, shopping, and community activity  PERSONAL FACTORS: Past/current experiences are also affecting patient's functional outcome.   REHAB POTENTIAL: Good  CLINICAL DECISION MAKING: Evolving/moderate complexity  EVALUATION COMPLEXITY: Moderate   GOALS: Goals  reviewed with patient? Yes  SHORT TERM GOALS: Target date: 02/28/23  I with initial HEP Baseline: Goal status: 02/13/23 MET  LONG TERM GOALS: Target date: 05/05/23  I with final HEP Baseline:  Goal status: 03/10/23-ongoing  03/20/23 evolving  2.  Patient will recover at least 120 degrees of active R shoulder flexion and abduction Baseline: See ROM Goal status: 03/10/23 progressing  03/20/23 ongoing, 04/07/23, met for flex, ongoing for abd   04/17/23 MET  3.  Increase RUE shoulder strength to at least 4/5 Baseline: 3-/5 Goal status: 03/17/23-flex 3+/5, Abd 3/5, 04/07/23- 4-/5 ongoing   04/17/23  met except ER 4-/5  4.  Patient will report the ability to perform all of her normal daily activities with RU. Baseline: Severely limited functionally Goal status: 04/07/23-met  5.  Decrease R shoulder pain to < 2/10 with all activity. Baseline: 4/10 Goal status: 04/07/23-3/10 during exercises, ongoing  PLAN:  PT FREQUENCY: 1-2x/week  PT DURATION: 12 weeks  PLANNED INTERVENTIONS: Therapeutic exercises, Therapeutic activity, Neuromuscular re-education, Balance training, Gait training, Patient/Family education, Self Care, Joint mobilization, Dry Needling, Electrical stimulation, Cryotherapy, Moist heat, Vasopneumatic device, Ionotophoresis 4mg /ml Dexamethasone, and Manual therapy  PLAN FOR NEXT SESSION: progress srtengthening  Patient Details  Name: [name] MRN: [id number] Date of Birth: [birth year] Referring Provider:  Shelva Majestic, MD  Encounter Date: 04/22/2023  Suanne Marker, PTA 04/22/2023, 2:47 PM  [facility] [address] [place], [place], [address]

## 2023-04-24 ENCOUNTER — Ambulatory Visit: Payer: Medicare PPO | Admitting: Physical Therapy

## 2023-04-24 ENCOUNTER — Encounter: Payer: Self-pay | Admitting: Physical Therapy

## 2023-04-24 DIAGNOSIS — M25611 Stiffness of right shoulder, not elsewhere classified: Secondary | ICD-10-CM | POA: Diagnosis not present

## 2023-04-24 DIAGNOSIS — G8918 Other acute postprocedural pain: Secondary | ICD-10-CM | POA: Diagnosis not present

## 2023-04-24 DIAGNOSIS — M25511 Pain in right shoulder: Secondary | ICD-10-CM | POA: Diagnosis not present

## 2023-04-24 DIAGNOSIS — R600 Localized edema: Secondary | ICD-10-CM

## 2023-04-24 DIAGNOSIS — M6281 Muscle weakness (generalized): Secondary | ICD-10-CM

## 2023-04-24 DIAGNOSIS — Z96612 Presence of left artificial shoulder joint: Secondary | ICD-10-CM

## 2023-04-24 NOTE — Therapy (Signed)
Neuromuscular re-education, Balance training, Gait training, Patient/Family education, Self Care, Joint mobilization, Dry Needling, Electrical stimulation, Cryotherapy, Moist heat, Vasopneumatic device, Ionotophoresis 4mg /ml Dexamethasone, and Manual therapy  PLAN FOR NEXT  SESSION: progress srtengthening  Patient Details  Name: [name] MRN: [id number] Date of Birth: January 29, 1951 Referring Provider:  Shelva Majestic, MD  Encounter Date: 04/24/2023  Iona Beard, PT 04/24/2023, 4:17 PM  [facility] [address] [place], [place], [address]  Neuromuscular re-education, Balance training, Gait training, Patient/Family education, Self Care, Joint mobilization, Dry Needling, Electrical stimulation, Cryotherapy, Moist heat, Vasopneumatic device, Ionotophoresis 4mg /ml Dexamethasone, and Manual therapy  PLAN FOR NEXT  SESSION: progress srtengthening  Patient Details  Name: [name] MRN: [id number] Date of Birth: January 29, 1951 Referring Provider:  Shelva Majestic, MD  Encounter Date: 04/24/2023  Iona Beard, PT 04/24/2023, 4:17 PM  [facility] [address] [place], [place], [address]  OUTPATIENT PHYSICAL THERAPY SHOULDER     Patient Name: [name] MRN: [id number] DOB:1951-05-29, 72 y.o., female Today's Date: 04/24/2023  END OF SESSION:  PT End of Session - 04/24/23 1538     Visit Number 22    Date for PT Re-Evaluation 05/05/23    PT Start Time 1515    PT Stop Time 1555    PT Time Calculation (min) 40 min             Past Medical History:  Diagnosis Date   Allergy    Anxiety    Arthritis    back & knee   Asthma     mild per pt shows up with resp illness   Colon polyps    Constipation    Diabetes mellitus without complication (HCC)    Gallstones    Gastric polyps    Gastroparesis    GERD (gastroesophageal reflux disease)    Headache    sinus headaches and migraines at times   Heart murmur    History of migraine headaches    HTN (hypertension)    Hyperlipidemia    IBS (irritable bowel syndrome)    Joint pain    Lumbar disc disease    PONV (postoperative nausea and vomiting)    S/P aortic valve replacement with bioprosthetic valve 08/23/2016   25 mm Edwards Intuity rapid-deployment bovine pericardial tissue valve via partial upper mini sternotomy   Sleep apnea    does not use CPAP   TIA (transient ischemic attack)    TIA (transient ischemic attack)    hx of per pt   Past Surgical History:  Procedure Laterality Date   ABDOMINAL HYSTERECTOMY     AORTIC VALVE REPLACEMENT N/A 08/23/2016   Procedure: AORTIC VALVE REPLACEMENT (AVR) - using partial Upper Sternotomy- 25mm Edwards Intuity Aortic Valve used;  Surgeon: Purcell Nails, MD;  Location: [facility] OR;  Service: Open Heart Surgery;  Laterality: N/A;   BLADDER SUSPENSION  2007   BREAST ENHANCEMENT SURGERY  1980   CARPAL TUNNEL RELEASE Bilateral    COLONOSCOPY     DORSAL COMPARTMENT RELEASE Right 09/15/2014   Procedure: RIGHT WRIST DEQUERVAINS RELEASE ;  Surgeon: Mckinley Jewel, MD;  Location: [facility];  Service: Orthopedics;  Laterality: Right;   DRUG INDUCED ENDOSCOPY  N/A 02/19/2022   Procedure: DRUG INDUCED SLEEP ENDOSCOPY;  Surgeon: Osborn Coho, MD;  Location: [facility];  Service: ENT;  Laterality: N/A;   ESOPHAGOGASTRODUODENOSCOPY     EYE SURGERY     cataract surgery   IMPLANTATION OF HYPOGLOSSAL NERVE STIMULATOR Right 04/23/2022   Procedure: IMPLANTATION OF HYPOGLOSSAL NERVE STIMULATOR;  Surgeon: Osborn Coho, MD;  Location: [facility];  Service: ENT;  Laterality: Right;   KNEE ARTHROSCOPY  04/15/2012   Procedure: ARTHROSCOPY KNEE;  Surgeon: Loreta Ave, MD;  Location: [facility] OR;  Service: Orthopedics;  Laterality: Right;   LAPAROSCOPIC CHOLECYSTECTOMY     LASIK     OOPHORECTOMY     PALATE TO GINGIVA GRAFT  2017   REVERSE SHOULDER ARTHROPLASTY Left 02/13/2021   Procedure: REVERSE SHOULDER ARTHROPLASTY;  Surgeon: Teryl Lucy, MD;  Location: [facility];  Service: Orthopedics;  Laterality: Left;   RIGHT/LEFT HEART CATH AND CORONARY ANGIOGRAPHY N/A 08/02/2016   Procedure: Right/Left Heart Cath and Coronary Angiography;  Surgeon: Tonny Bollman, MD;  Location: [facility] INVASIVE CV LAB;  Service: Cardiovascular;  Laterality: N/A;   ROTATOR CUFF REPAIR Left    SHOULDER ARTHROSCOPY WITH ROTATOR CUFF  OUTPATIENT PHYSICAL THERAPY SHOULDER     Patient Name: [name] MRN: [id number] DOB:1951-05-29, 72 y.o., female Today's Date: 04/24/2023  END OF SESSION:  PT End of Session - 04/24/23 1538     Visit Number 22    Date for PT Re-Evaluation 05/05/23    PT Start Time 1515    PT Stop Time 1555    PT Time Calculation (min) 40 min             Past Medical History:  Diagnosis Date   Allergy    Anxiety    Arthritis    back & knee   Asthma     mild per pt shows up with resp illness   Colon polyps    Constipation    Diabetes mellitus without complication (HCC)    Gallstones    Gastric polyps    Gastroparesis    GERD (gastroesophageal reflux disease)    Headache    sinus headaches and migraines at times   Heart murmur    History of migraine headaches    HTN (hypertension)    Hyperlipidemia    IBS (irritable bowel syndrome)    Joint pain    Lumbar disc disease    PONV (postoperative nausea and vomiting)    S/P aortic valve replacement with bioprosthetic valve 08/23/2016   25 mm Edwards Intuity rapid-deployment bovine pericardial tissue valve via partial upper mini sternotomy   Sleep apnea    does not use CPAP   TIA (transient ischemic attack)    TIA (transient ischemic attack)    hx of per pt   Past Surgical History:  Procedure Laterality Date   ABDOMINAL HYSTERECTOMY     AORTIC VALVE REPLACEMENT N/A 08/23/2016   Procedure: AORTIC VALVE REPLACEMENT (AVR) - using partial Upper Sternotomy- 25mm Edwards Intuity Aortic Valve used;  Surgeon: Purcell Nails, MD;  Location: [facility];  Service: Open Heart Surgery;  Laterality: N/A;   BLADDER SUSPENSION  2007   BREAST ENHANCEMENT SURGERY  1980   CARPAL TUNNEL RELEASE Bilateral    COLONOSCOPY     DORSAL COMPARTMENT RELEASE Right 09/15/2014   Procedure: RIGHT WRIST DEQUERVAINS RELEASE ;  Surgeon: Mckinley Jewel, MD;  Location: [facility];  Service: Orthopedics;  Laterality: Right;   DRUG INDUCED ENDOSCOPY  N/A 02/19/2022   Procedure: DRUG INDUCED SLEEP ENDOSCOPY;  Surgeon: Osborn Coho, MD;  Location: [facility];  Service: ENT;  Laterality: N/A;   ESOPHAGOGASTRODUODENOSCOPY     EYE SURGERY     cataract surgery   IMPLANTATION OF HYPOGLOSSAL NERVE STIMULATOR Right 04/23/2022   Procedure: IMPLANTATION OF HYPOGLOSSAL NERVE STIMULATOR;  Surgeon: Osborn Coho, MD;  Location: [facility];  Service: ENT;  Laterality: Right;   KNEE ARTHROSCOPY  04/15/2012   Procedure: ARTHROSCOPY KNEE;  Surgeon: Loreta Ave, MD;  Location: [facility] OR;  Service: Orthopedics;  Laterality: Right;   LAPAROSCOPIC CHOLECYSTECTOMY     LASIK     OOPHORECTOMY     PALATE TO GINGIVA GRAFT  2017   REVERSE SHOULDER ARTHROPLASTY Left 02/13/2021   Procedure: REVERSE SHOULDER ARTHROPLASTY;  Surgeon: Teryl Lucy, MD;  Location: [facility];  Service: Orthopedics;  Laterality: Left;   RIGHT/LEFT HEART CATH AND CORONARY ANGIOGRAPHY N/A 08/02/2016   Procedure: Right/Left Heart Cath and Coronary Angiography;  Surgeon: Tonny Bollman, MD;  Location: [facility] INVASIVE CV LAB;  Service: Cardiovascular;  Laterality: N/A;   ROTATOR CUFF REPAIR Left    SHOULDER ARTHROSCOPY WITH ROTATOR CUFF  Neuromuscular re-education, Balance training, Gait training, Patient/Family education, Self Care, Joint mobilization, Dry Needling, Electrical stimulation, Cryotherapy, Moist heat, Vasopneumatic device, Ionotophoresis 4mg /ml Dexamethasone, and Manual therapy  PLAN FOR NEXT  SESSION: progress srtengthening  Patient Details  Name: [name] MRN: [id number] Date of Birth: January 29, 1951 Referring Provider:  Shelva Majestic, MD  Encounter Date: 04/24/2023  Iona Beard, PT 04/24/2023, 4:17 PM  [facility] [address] [place], [place], [address]  Neuromuscular re-education, Balance training, Gait training, Patient/Family education, Self Care, Joint mobilization, Dry Needling, Electrical stimulation, Cryotherapy, Moist heat, Vasopneumatic device, Ionotophoresis 4mg /ml Dexamethasone, and Manual therapy  PLAN FOR NEXT  SESSION: progress srtengthening  Patient Details  Name: [name] MRN: [id number] Date of Birth: January 29, 1951 Referring Provider:  Shelva Majestic, MD  Encounter Date: 04/24/2023  Iona Beard, PT 04/24/2023, 4:17 PM  [facility] [address] [place], [place], [address]

## 2023-04-28 ENCOUNTER — Ambulatory Visit: Payer: Medicare PPO | Attending: Orthopedic Surgery | Admitting: Physical Therapy

## 2023-04-28 ENCOUNTER — Encounter: Payer: Self-pay | Admitting: Physical Therapy

## 2023-04-28 DIAGNOSIS — M6281 Muscle weakness (generalized): Secondary | ICD-10-CM | POA: Diagnosis not present

## 2023-04-28 DIAGNOSIS — M25611 Stiffness of right shoulder, not elsewhere classified: Secondary | ICD-10-CM | POA: Insufficient documentation

## 2023-04-28 DIAGNOSIS — Z96612 Presence of left artificial shoulder joint: Secondary | ICD-10-CM | POA: Diagnosis not present

## 2023-04-28 DIAGNOSIS — R600 Localized edema: Secondary | ICD-10-CM | POA: Diagnosis not present

## 2023-04-28 NOTE — Therapy (Signed)
OUTPATIENT PHYSICAL THERAPY SHOULDER     Patient Name: [name] MRN: [id number] DOB:08/05/1950, 72 y.o., female Today's Date: 04/28/2023  END OF SESSION:  PT End of Session - 04/28/23 1539     Visit Number 23    Date for PT Re-Evaluation 05/05/23    PT Start Time 1540    PT Stop Time 1620    PT Time Calculation (min) 40 min    Activity Tolerance Patient tolerated treatment well    Behavior During Therapy WFL for tasks assessed/performed             Past Medical History:  Diagnosis Date   Allergy    Anxiety    Arthritis    back & knee   Asthma     mild per pt shows up with resp illness   Colon polyps    Constipation    Diabetes mellitus without complication (HCC)    Gallstones    Gastric polyps    Gastroparesis    GERD (gastroesophageal reflux disease)    Headache    sinus headaches and migraines at times   Heart murmur    History of migraine headaches    HTN (hypertension)    Hyperlipidemia    IBS (irritable bowel syndrome)    Joint pain    Lumbar disc disease    PONV (postoperative nausea and vomiting)    S/P aortic valve replacement with bioprosthetic valve 08/23/2016   25 mm Edwards Intuity rapid-deployment bovine pericardial tissue valve via partial upper mini sternotomy   Sleep apnea    does not use CPAP   TIA (transient ischemic attack)    TIA (transient ischemic attack)    hx of per pt   Past Surgical History:  Procedure Laterality Date   ABDOMINAL HYSTERECTOMY     AORTIC VALVE REPLACEMENT N/A 08/23/2016   Procedure: AORTIC VALVE REPLACEMENT (AVR) - using partial Upper Sternotomy- 25mm Edwards Intuity Aortic Valve used;  Surgeon: Purcell Nails, MD;  Location: [facility] OR;  Service: Open Heart Surgery;  Laterality: N/A;   BLADDER SUSPENSION  2007   BREAST ENHANCEMENT SURGERY  1980   CARPAL TUNNEL RELEASE Bilateral    COLONOSCOPY     DORSAL COMPARTMENT RELEASE Right 09/15/2014   Procedure: RIGHT WRIST DEQUERVAINS RELEASE ;  Surgeon: Mckinley Jewel, MD;  Location: [facility];  Service: Orthopedics;  Laterality: Right;   DRUG INDUCED ENDOSCOPY N/A 02/19/2022   Procedure: DRUG INDUCED SLEEP ENDOSCOPY;  Surgeon: Osborn Coho, MD;  Location: [facility];  Service: ENT;  Laterality: N/A;   ESOPHAGOGASTRODUODENOSCOPY     EYE SURGERY     cataract surgery   IMPLANTATION OF HYPOGLOSSAL NERVE STIMULATOR Right 04/23/2022   Procedure: IMPLANTATION OF HYPOGLOSSAL NERVE STIMULATOR;  Surgeon: Osborn Coho, MD;  Location: [facility];  Service: ENT;  Laterality: Right;   KNEE ARTHROSCOPY  04/15/2012   Procedure: ARTHROSCOPY KNEE;  Surgeon: Loreta Ave, MD;  Location: [facility] OR;  Service: Orthopedics;  Laterality: Right;   LAPAROSCOPIC CHOLECYSTECTOMY     LASIK     OOPHORECTOMY     PALATE TO GINGIVA GRAFT  2017   REVERSE SHOULDER ARTHROPLASTY Left 02/13/2021   Procedure: REVERSE SHOULDER ARTHROPLASTY;  Surgeon: Teryl Lucy, MD;  Location: [facility];  Service: Orthopedics;  Laterality: Left;   RIGHT/LEFT HEART CATH AND CORONARY ANGIOGRAPHY N/A 08/02/2016   Procedure: Right/Left Heart Cath and Coronary Angiography;  Surgeon: Tonny Bollman, MD;  Location: [facility];  Service: Cardiovascular;  Laterality: N/A;   ROTATOR CUFF REPAIR Left    SHOULDER ARTHROSCOPY WITH ROTATOR CUFF REPAIR AND SUBACROMIAL DECOMPRESSION Right 12/24/2022   Procedure: SHOULDER ARTHROSCOPY WITH DEBRIDEMENT, ROTATOR CUFF REPAIR AND SUBACROMIAL DECOMPRESSION;  Surgeon: Teryl Lucy, MD;  Location: [facility];  Service: Orthopedics;  Laterality: Right;   TEE WITHOUT CARDIOVERSION N/A 08/23/2016   Procedure: TRANSESOPHAGEAL ECHOCARDIOGRAM (TEE);  Surgeon: Purcell Nails, MD;  Location: [facility] OR;  Service: Open Heart Surgery;  Laterality: N/A;   TOTAL KNEE ARTHROPLASTY  04/15/2012   Procedure: TOTAL KNEE ARTHROPLASTY;  Surgeon: Loreta Ave, MD;  Location: [facility] OR;  Service: Orthopedics;  Laterality: Right;    TRIGGER FINGER RELEASE Right 04/20/2015   Procedure: RIGHT TRIGGER FINGER RELEASE (TENDON SHEALTH INCISION) ;  Surgeon: Loreta Ave, MD;  Location: [facility];  Service: Orthopedics;  Laterality: Right;   Patient Active Problem List   Diagnosis Date Noted   Class 2 severe obesity with serious comorbidity and body mass index (BMI) of 36.0 to 36.9 in adult [facility]) 05/09/2022   Type 2 diabetes mellitus with obesity (HCC) 05/09/2022   Hypertension associated with type 2 diabetes mellitus (HCC) 05/09/2022   SOB (shortness of breath) 12/10/2021   S/P reverse total shoulder arthroplasty, left 02/13/2021   Idiopathic hyperphosphatasia 08/03/2019   Vitamin D deficiency 06/04/2018   Hyponatremia 02/17/2018   History of CVA in adulthood 08/13/2017   Blurry vision 08/13/2017   Primary hypertension 01/02/2017   S/P minimally invasive aortic valve replacement with bioprosthetic valve 08/23/2016   Diastolic dysfunction    Osteopenia 05/27/2016   OSA (obstructive sleep apnea) 02/27/2016   Bruxism 10/23/2015   Elevated alkaline phosphatase level 05/16/2015   Atypical chest pain 11/10/2014   Acne 05/12/2014   Diabetes mellitus (HCC) 05/12/2014   GERD (gastroesophageal reflux disease) 03/10/2014   Generalized anxiety disorder 03/10/2014   Migraines 03/10/2014   EUSTACHIAN TUBE DYSFUNCTION, CHRONIC 06/14/2010   Irritable bowel syndrome 12/15/2008   Low back pain 08/11/2008   Hyperlipidemia associated with type 2 diabetes mellitus (HCC) 02/09/2008   Allergic rhinitis 02/12/2007   Asthma 02/12/2007    PCP: Shelva Majestic, MD  REFERRING PROVIDER: Teryl Lucy, MD  REFERRING DIAG: Rt Shoulder Arthroscopy 12/24/22   THERAPY DIAG:  Stiffness of right shoulder, not elsewhere classified  Muscle weakness (generalized)  S/P reverse total shoulder arthroplasty, left  Rationale for Evaluation and Treatment: Rehabilitation  ONSET DATE: 12/24/22  SUBJECTIVE:  SUBJECTIVE STATEMENT:   Patient reports no new changes.   Hand dominance: Right  PERTINENT HISTORY: Per order: S/P R shoulder arthroscopy with RC repair, distal clavicle excision and acromioplasty 12/24/22 Supraspinatus complete tear, biceps tendon with significant tearing Surgical: PROCEDURE: Right shoulder arthroscopy with extensive debridement of biceps tendon, superior labrum, subdeltoid bursa, with acromioplasty, distal clavicle resection, and supraspinatus repair   PAIN:  Are you having pain? Yes: NPRS scale: 3/10 Pain location: R shoulder Pain description: aching Aggravating factors: Has trouble getting comfortable at night. Relieving factors: Tramadol at  night, Tylenol in am  PRECAUTIONS: Shoulder  RED FLAGS: None   WEIGHT BEARING RESTRICTIONS: Patient reports none given  FALLS:  Has patient fallen in last 6 months? No  LIVING ENVIRONMENT: Lives with: lives with their family and lives with their spouse Lives in: House/apartment Stairs: Yes: External: 5 steps; on left going up Has following equipment at home: None  OCCUPATION: Retired  PLOF: Independent  PATIENT GOALS:Patient would like to regain her motion and strength in the R arm, be able to sleep  NEXT MD VISIT:   OBJECTIVE:   DIAGNOSTIC FINDINGS:  N/A  COGNITION: Overall cognitive status: Within functional limits for tasks assessed     SENSATION: WFL  POSTURE: Round shoulders, flexed posture, dowager's hump  UPPER EXTREMITY ROM:   P/A ROM Right eval Left eval AA/Act on finger ladder  Standing 02/20/23 RT R shoulder P/AROM 03/10/23 R shoulder P/AROM 04/07/23  Shoulder flexion 50/105  130 / 115 137/116 165/130  Shoulder extension       Shoulder abduction 46/58  120/ 95 123/92 145/110  Shoulder adduction       Shoulder internal  rotation WNL      Shoulder external rotation 30/45    67/50  Elbow flexion       Elbow extension       Wrist flexion       Wrist extension       Wrist ulnar deviation       Wrist radial deviation       Wrist pronation       Wrist supination       (Blank rows = not tested)  UPPER EXTREMITY MMT: 3-/5 R shoulder. Elbow, wrist at least 3+/5  JOINT MOBILITY TESTING:  G-H deferred, scapula ROM limited, stiff  PALPATION:  TTP over supraspinatus, pects with tihgtness in pects and cervical paraspinals.   TODAY'S TREATMENT:                                                                                                                                         DATE:  04/28/23 NuStep L5 x 6 minutes R humeral self mobs, sitting and hold mat with RUE, lean to L with L lateral cervical flexion. Seated press ups with 5 sec hold, x 10 reps. Standing shoulder ext, rows, 10#, 2 x 10 reps each. Standing AR,  10#, 2 x 10 reps each way. Shelf taps, 2# weight, 2 x 10 flex and abd VASO, med pressure, 10 min to R shoulder  04/24/23 NuStep L5 x 6 minutes R shoulder assessment to identify painful tissue- R supraspinatus tendon and biceps tendon both TTP. Seated shoulder self depression holding side of mat and lean to L. 2 x 15 sec Standing doorway lean for R shoulder distraction 2 x 15 sec Seated press ups, 5 sec hold, 10  Wall slides with 1# cuff weight on R, emphasize good shoulder mechanics, x 10 VASO x 10 min, med pressure.  04/22/23 Nustep L 5 Lat pull 25#,Chest press 10# with serratus,Seated row 25# ,Bicep 15# and  Tricep 25# 2 sets 10 3# cabinet reaching flex and abd 10x 6# cane ex 15 x each Cable pulley IR/ER 2 sets 10 PROM for shoulder flex and abd and ER VASO x 10 min, med pressure  04/17/23 Standing Rt shld performed at start of session AROM Flexion  170  ,abd  150     ,ER 80    and  IR  70 MMT  flexion 4+/5    , abd 4/5      ,ER 4-/5   and IR 4/5 Nustep L 5 8 min 3# cane ex  15 x each way Green tband IR/Er 2 sets 10 Cable pulley shld ext and row 2 sets 10 10# Tricep ext 2 sets 10 Bicep curl 2 sets 10 STM to UT and pect, PROM for shoulder flex and abd and ER VASO x 10 min, med pressure  04/14/23 NuStep L5 x 6 minutes, then 2 x 30 sec L7, emphasize arms Wall slides flex and abd, 2 x 10 each STM to UT and pect, PROM for shoulder flex and abd and ER Resisted ER with G tband, 2 x 10 reps Overhead shoulder flex and ext against wall, 4# weight, 10 reps. VASO, 10 min, R shoulder, med pressure.  04/10/23 Nustep L 5 8 min Reviewed and reissued HEP and performed TBAND ex ( shld ext,row,ER and anti rotation) performed with red but issued red and green tband Wt ball chest press 10x and OH press 10x Modified standing push up 2 sets 10 Dips 2 sets 5 Lat pull 25#,Chest press 10# with serratus,Seated row 25# ,Bicep 15# and  Tricep 25# 2 sets 10 STW and joint mob VASO  04/07/23 NuStep L5 x 8 minutes AAROM with dowel, flex, abd, ext, IR, ER x 10 each Re-assessed goal status, see ROM chart and goals Overhead carry, 2#, x 100' Anti rotation, 10#, 1 x 10 each side. VASO, med pressure, 34, x 10 min, to R shoulder for pain and inflammation relief.  04/03/23 NuStep L5 x 6 minutes Wall slides x 10, flex, scaption, abd STM to R UT, scap mobs and passive stretch into abd and flexion Supine B shoulder flex with 3# WATE bar x 10 R chest press in supine with 3# weight x 10 Supine IR with 3#, 2 x 10 reps Seated shoulder ER, R, Red Tband 2 x 10 Standing B shoulder ext and IR with 3# WATE bar, 2 x 10 Body Blade shoulder stabilization x 20 sec both vert and horiz in flex and abd VASO x 10 min, med pressure  03/31/23 NuStep L5 x 6 min STM to R cervical paraspinals, RUT, pects, deltoid F/B PROM for all shoulder movements Shelf taps with 2# weight 2 shelves x 10 each. R shoulder flex with 2# weight, reaching to tap  wall behind. 10 reps, min A to reach back the last  2-3" VASO to R shoulder x 10 min, med pressure.  03/28/23 Nustep level 5 x 6 minutes STM to the right upper trap and right upper arm Passive stretch right shoulder all motions to end range Supine 3# punches with serratus, 3# isometric circles 2# ER/IR Red tband row Red tband ER/IR Red tband horizontal abduction  03/26/23 NuStep L5 x 6 minutes STM with stretching to capsule ant/post, superior, holds Scapular mobs PROM for shoulder flex abd Shoulder IR with 5#, 2 x 10. Shoulder ER Gtband 2 x 10 reps Standing overhead flex and ext facing and then with back to wall, 3# weight x 10 each. Unable to quite reach the wall when facing away Wall stars with yellow Tband around wrists, x 5 in each direction. VASO to R shoulder x 10 min, med pressure  03/20/23 AROM standing RT shld at [facility] of Session  flex 165  abd 142  ER 80  IR 80 Nustep L 5 Cable pulley IR and ER 2 sets 10 Standing Red Tband flex and abd 2 sets 10 Cable pulleys standing row 2 sets 10 10# Seated chest press with serratus 2 sets 10 Lat Pull with stretch 2 sets 10 STM to R UT, cervical paraspinals, with cervical distraction, scapular mobs and passive stretch into depression, flex, abd, ER VASO to R shoulder for pain and inflammation control x 10 min, med pressure.  03/17/23 NuStep using arms and legs L5 x 6 minutes STM to R UT, cervical paraspinals, with cervical distraction, scapular mobs and passive stretch into depression, flex, abd, ER Seated lat pulls with passive stretch, 15#, 2 x 10 Standing row with rope for more narrow grip, 2 x 10, 10# Standing overhead flex and ext facing and then with back to wall, 3# weight x 10 each. Unable to quite reach the wall when facing away. Body Blade in 90 felx and abd, horiz and vert, 15 sec each. VASO to R shoulder for pain and inflammation control x 10 min, med pressure.  PATIENT EDUCATION: Education details: POC Person educated: Patient Education method:  Explanation Education comprehension: verbalized understanding  HOME EXERCISE PROGRAM: [id number]  ASSESSMENT:  CLINICAL IMPRESSION:   Patient reports that her shoulder pain remains. She did perform the HEP and feels it helps the shoulder. Treatment focused on R shoulder self mobs followed by scapular stabilization to help the shoulder track normally throughout ROM.  OBJECTIVE IMPAIRMENTS: decreased activity tolerance, decreased balance, decreased coordination, decreased ROM, decreased strength, impaired flexibility, impaired UE functional use, improper body mechanics, postural dysfunction, and pain.   ACTIVITY LIMITATIONS: carrying, lifting, sleeping, and reach over head  PARTICIPATION LIMITATIONS: meal prep, cleaning, laundry, driving, shopping, and community activity  PERSONAL FACTORS: Past/current experiences are also affecting patient's functional outcome.   REHAB POTENTIAL: Good  CLINICAL DECISION MAKING: Evolving/moderate complexity  EVALUATION COMPLEXITY: Moderate   GOALS: Goals reviewed with patient? Yes  SHORT TERM GOALS: Target date: 02/28/23  I with initial HEP Baseline: Goal status: 02/13/23 MET  LONG TERM GOALS: Target date: 05/05/23  I with final HEP Baseline:  Goal status: 03/10/23-ongoing  03/20/23 evolving  2.  Patient will recover at least 120 degrees of active R shoulder flexion and abduction Baseline: See ROM Goal status: 03/10/23 progressing  03/20/23 ongoing, 04/07/23, met for flex, ongoing for abd   04/17/23 MET  3.  Increase RUE shoulder strength to at least 4/5 Baseline: 3-/5 Goal status: 03/17/23-flex 3+/5, Abd 3/5,  04/07/23- 4-/5 ongoing   04/17/23  met except ER 4-/5  4.  Patient will report the ability to perform all of her normal daily activities with RU. Baseline: Severely limited functionally Goal status: 04/07/23-met  5.  Decrease R shoulder pain to < 2/10 with all activity. Baseline: 4/10 Goal status: 04/07/23-3/10 during exercises,  ongoing  PLAN:  PT FREQUENCY: 1-2x/week  PT DURATION: 12 weeks  PLANNED INTERVENTIONS: Therapeutic exercises, Therapeutic activity, Neuromuscular re-education, Balance training, Gait training, Patient/Family education, Self Care, Joint mobilization, Dry Needling, Electrical stimulation, Cryotherapy, Moist heat, Vasopneumatic device, Ionotophoresis 4mg /ml Dexamethasone, and Manual therapy  PLAN FOR NEXT SESSION: progress srtengthening  Patient Details  Name: [name] MRN: [id number] Date of Birth: 01/26/1951 Referring Provider:  Shelva Majestic, MD  Encounter Date: 04/28/2023  Iona Beard, DPT 04/28/2023, 4:08 PM  [place]  [facility] at [facility] [address] [place], [place], [address]

## 2023-04-29 ENCOUNTER — Ambulatory Visit: Payer: Medicare PPO | Admitting: Internal Medicine

## 2023-04-29 ENCOUNTER — Encounter (INDEPENDENT_AMBULATORY_CARE_PROVIDER_SITE_OTHER): Payer: Self-pay | Admitting: Adult Health

## 2023-04-29 ENCOUNTER — Ambulatory Visit (INDEPENDENT_AMBULATORY_CARE_PROVIDER_SITE_OTHER): Payer: Medicare PPO | Admitting: Adult Health

## 2023-04-29 VITALS — BP 129/65 | HR 71 | Temp 98.3°F | Ht 66.0 in | Wt 201.0 lb

## 2023-04-29 DIAGNOSIS — E119 Type 2 diabetes mellitus without complications: Secondary | ICD-10-CM | POA: Diagnosis not present

## 2023-04-29 DIAGNOSIS — Z7984 Long term (current) use of oral hypoglycemic drugs: Secondary | ICD-10-CM

## 2023-04-29 DIAGNOSIS — G4733 Obstructive sleep apnea (adult) (pediatric): Secondary | ICD-10-CM | POA: Diagnosis not present

## 2023-04-29 DIAGNOSIS — E66812 Obesity, class 2: Secondary | ICD-10-CM

## 2023-04-29 DIAGNOSIS — E669 Obesity, unspecified: Secondary | ICD-10-CM

## 2023-04-29 DIAGNOSIS — Z6832 Body mass index (BMI) 32.0-32.9, adult: Secondary | ICD-10-CM

## 2023-04-29 NOTE — Progress Notes (Signed)
WEIGHT SUMMARY AND BIOMETRICS  Vitals Temp: 98.3 F (36.8 C) BP: 129/65 Pulse Rate: 71 SpO2: 99 %   Anthropometric Measurements Height: 5\' 6"  (1.676 m) Weight: 201 lb (91.2 kg) BMI (Calculated): 32.46 Weight at Last Visit: 205 lb Weight Lost Since Last Visit: 4 lb Weight Gained Since Last Visit: 0 Starting Weight: 220 lb Total Weight Loss (lbs): 19 lb (8.618 kg)   Body Composition  Body Fat %: 46.5 % Fat Mass (lbs): 93.8 lbs Muscle Mass (lbs): 102.4 lbs Total Body Water (lbs): 74.8 lbs Visceral Fat Rating : 14   Other Clinical Data Fasting: no Labs: no Today's Visit #: 75 Starting Date: 01/19/18    Chief Complaint:   OBESITY [name] is here to discuss her progress with her obesity treatment plan. She is on the the Category 2 Plan and states she is following her eating plan approximately 70 % of the time.  She states she is exercising Out Patient and Home PT 45  minutes 2/7 times per week.   Interim History:  12/24/2022 SHOULDER ARTHROSCOPY WITH DEBRIDEMENT, ROTATOR CUFF REPAIR AND SUBACROMIAL DECOMPRESSION  She reports fasting CBG prior to shoulder procedure: 140s Fasting CBG since mid July: 150-160s  She has PCP OV with fasting labs- 05/08/2023  Reviewed Bioimpedance results with pt: Muscle Mass: +1.2 lbs Adipose Mass: - 5 lbs  Subjective:   1. Type 2 diabetes mellitus without complication, unspecified whether long term insulin use (HCC) Lab Results  Component Value Date   HGBA1C 6.3 10/03/2022   HGBA1C 6.3 (H) 03/05/2022   HGBA1C 6.3 09/24/2021    PCP manages Metformin 500mg - per pt, she is only taking tab daily. She reports fasting CBG prior to R shoulder Arthroscopy 140s Fasting CBG AFTER orthopedic surgery 150-160s  She was unable to tolerate Ozempic therapy, re: severe constipation  2. OSA She endorses improved duration and quality of sleep since 04/23/2022-IMPLANTATION OF HYPOGLOSSAL NERVE STIMULATOR   Assessment/Plan:   1.  Type 2 diabetes mellitus without complication, unspecified whether long term insulin use (HCC) F/u with Dr. Lafe Garin this week Discusss increased fasting CBG  2. OSA IMPLANTATION OF HYPOGLOSSAL NERVE STIMULATOR  Continue with weight loss efforts  3. Obesity, current BMI 32.5  [name] is currently in the action stage of change. As such, her goal is to continue with weight loss efforts. She has agreed to the Category 2 Plan.   Exercise goals: Older adults should follow the adult guidelines. When older adults cannot meet the adult guidelines, they should be as physically active as their abilities and conditions will allow.  Older adults should do exercises that maintain or improve balance if they are at risk of falling.  Older adults should determine their level of effort for physical activity relative to their level of fitness.  Older adults with chronic conditions should understand whether and how their conditions affect their ability to do regular physical activity safely.  Behavioral modification strategies: increasing lean protein intake, decreasing simple carbohydrates, increasing vegetables, increasing water intake, no skipping meals, meal planning and cooking strategies, keeping healthy foods in the home, ways to avoid boredom eating, and planning for success.  [name] has agreed to follow-up with our clinic in 4 weeks. She was informed of the importance of frequent follow-up visits to maximize her success with intensive lifestyle modifications for her multiple health conditions.   Objective:   Blood pressure 129/65, pulse 71, temperature 98.3 F (36.8 C), height 5\' 6"  (1.676 m), weight 201 lb (91.2 kg),  SpO2 99%. Body mass index is 32.44 kg/m.  General: Cooperative, alert, well developed, in no acute distress. HEENT: Conjunctivae and lids unremarkable. Cardiovascular: Regular rhythm.  Lungs: Normal work of breathing. Neurologic: No focal deficits.   Lab Results  Component  Value Date   CREATININE 0.53 12/19/2022   BUN 12 12/19/2022   NA 129 (L) 12/19/2022   K 4.1 12/19/2022   CL 98 12/19/2022   CO2 23 12/19/2022   Lab Results  Component Value Date   ALT 13 10/03/2022   AST 26 10/03/2022   ALKPHOS 119 (H) 10/03/2022   BILITOT 0.4 10/03/2022   Lab Results  Component Value Date   HGBA1C 6.3 10/03/2022   HGBA1C 6.3 (H) 03/05/2022   HGBA1C 6.3 09/24/2021   HGBA1C 6.6 (H) 06/05/2021   HGBA1C 6.5 (H) 02/09/2021   Lab Results  Component Value Date   INSULIN 6.3 03/05/2022   INSULIN 12.9 06/05/2021   INSULIN 7.7 01/25/2021   INSULIN 13.1 04/20/2020   INSULIN 12.1 01/19/2018   Lab Results  Component Value Date   TSH 4.23 09/17/2021   Lab Results  Component Value Date   CHOL 147 10/03/2022   HDL 72.70 10/03/2022   LDLCALC 61 10/03/2022   LDLDIRECT 67.0 04/03/2022   TRIG 68.0 10/03/2022   CHOLHDL 2 10/03/2022   Lab Results  Component Value Date   VD25OH 62.2 03/05/2022   VD25OH 70.1 06/05/2021   VD25OH 74.5 01/25/2021   Lab Results  Component Value Date   WBC 11.6 (H) 10/31/2022   HGB 11.0 (L) 10/31/2022   HCT 35.0 (L) 10/31/2022   MCV 78.9 10/31/2022   PLT 386.0 10/31/2022   Lab Results  Component Value Date   IRON 48 06/12/2022   TIBC 450.8 (H) 06/12/2022   FERRITIN 12.0 06/12/2022    Attestation Statements:   Reviewed by clinician on day of visit: allergies, medications, problem list, medical history, surgical history, family history, social history, and previous encounter notes.  Time spent on visit including pre-visit chart review and post-visit care and charting was 27 minutes.   I have reviewed the above documentation for accuracy and completeness, and I agree with the above. -  Koury Roddy d. Sadie Pickar, NP-C

## 2023-05-01 ENCOUNTER — Ambulatory Visit: Payer: Medicare PPO | Admitting: Physical Therapy

## 2023-05-01 ENCOUNTER — Encounter: Payer: Self-pay | Admitting: Physical Therapy

## 2023-05-01 DIAGNOSIS — R600 Localized edema: Secondary | ICD-10-CM

## 2023-05-01 DIAGNOSIS — Z96612 Presence of left artificial shoulder joint: Secondary | ICD-10-CM | POA: Diagnosis not present

## 2023-05-01 DIAGNOSIS — M25611 Stiffness of right shoulder, not elsewhere classified: Secondary | ICD-10-CM

## 2023-05-01 DIAGNOSIS — M6281 Muscle weakness (generalized): Secondary | ICD-10-CM

## 2023-05-01 NOTE — Therapy (Signed)
OUTPATIENT PHYSICAL THERAPY SHOULDER     Patient Name: [name] MRN: [id number] DOB:Apr 13, 1951, 72 y.o., female Today's Date: 05/01/2023  END OF SESSION:  PT End of Session - 05/01/23 1435     Visit Number 24    Date for PT Re-Evaluation 05/05/23    Authorization Type [employer]    PT Start Time 1440    PT Stop Time 1530    PT Time Calculation (min) 50 min    Activity Tolerance Patient tolerated treatment well    Behavior During Therapy WFL for tasks assessed/performed             Past Medical History:  Diagnosis Date   Allergy    Anxiety    Arthritis    back & knee   Asthma     mild per pt shows up with resp illness   Colon polyps    Constipation    Diabetes mellitus without complication (HCC)    Gallstones    Gastric polyps    Gastroparesis    GERD (gastroesophageal reflux disease)    Headache    sinus headaches and migraines at times   Heart murmur    History of migraine headaches    HTN (hypertension)    Hyperlipidemia    IBS (irritable bowel syndrome)    Joint pain    Lumbar disc disease    PONV (postoperative nausea and vomiting)    S/P aortic valve replacement with bioprosthetic valve 08/23/2016   25 mm Edwards Intuity rapid-deployment bovine pericardial tissue valve via partial upper mini sternotomy   Sleep apnea    does not use CPAP   TIA (transient ischemic attack)    TIA (transient ischemic attack)    hx of per pt   Past Surgical History:  Procedure Laterality Date   ABDOMINAL HYSTERECTOMY     AORTIC VALVE REPLACEMENT N/A 08/23/2016   Procedure: AORTIC VALVE REPLACEMENT (AVR) - using partial Upper Sternotomy- 25mm Edwards Intuity Aortic Valve used;  Surgeon: Purcell Nails, MD;  Location: [facility] OR;  Service: Open Heart Surgery;  Laterality: N/A;   BLADDER SUSPENSION  2007   BREAST ENHANCEMENT SURGERY  1980   CARPAL TUNNEL RELEASE Bilateral    COLONOSCOPY     DORSAL COMPARTMENT RELEASE Right 09/15/2014   Procedure: RIGHT WRIST DEQUERVAINS  RELEASE ;  Surgeon: Mckinley Jewel, MD;  Location: [facility];  Service: Orthopedics;  Laterality: Right;   DRUG INDUCED ENDOSCOPY N/A 02/19/2022   Procedure: DRUG INDUCED SLEEP ENDOSCOPY;  Surgeon: Osborn Coho, MD;  Location: [facility];  Service: ENT;  Laterality: N/A;   ESOPHAGOGASTRODUODENOSCOPY     EYE SURGERY     cataract surgery   IMPLANTATION OF HYPOGLOSSAL NERVE STIMULATOR Right 04/23/2022   Procedure: IMPLANTATION OF HYPOGLOSSAL NERVE STIMULATOR;  Surgeon: Osborn Coho, MD;  Location: [facility];  Service: ENT;  Laterality: Right;   KNEE ARTHROSCOPY  04/15/2012   Procedure: ARTHROSCOPY KNEE;  Surgeon: Loreta Ave, MD;  Location: [facility] OR;  Service: Orthopedics;  Laterality: Right;   LAPAROSCOPIC CHOLECYSTECTOMY     LASIK     OOPHORECTOMY     PALATE TO GINGIVA GRAFT  2017   REVERSE SHOULDER ARTHROPLASTY Left 02/13/2021   Procedure: REVERSE SHOULDER ARTHROPLASTY;  Surgeon: Teryl Lucy, MD;  Location: [facility];  Service: Orthopedics;  Laterality: Left;   RIGHT/LEFT HEART CATH AND CORONARY ANGIOGRAPHY N/A 08/02/2016   Procedure: Right/Left Heart Cath and Coronary Angiography;  Surgeon: Tonny Bollman, MD;  Location: [facility];  Service: Cardiovascular;  Laterality: N/A;   ROTATOR CUFF REPAIR Left    SHOULDER ARTHROSCOPY WITH ROTATOR CUFF REPAIR AND SUBACROMIAL DECOMPRESSION Right 12/24/2022   Procedure: SHOULDER ARTHROSCOPY WITH DEBRIDEMENT, ROTATOR CUFF REPAIR AND SUBACROMIAL DECOMPRESSION;  Surgeon: Teryl Lucy, MD;  Location: [facility];  Service: Orthopedics;  Laterality: Right;   TEE WITHOUT CARDIOVERSION N/A 08/23/2016   Procedure: TRANSESOPHAGEAL ECHOCARDIOGRAM (TEE);  Surgeon: Purcell Nails, MD;  Location: [facility] OR;  Service: Open Heart Surgery;  Laterality: N/A;   TOTAL KNEE ARTHROPLASTY  04/15/2012   Procedure: TOTAL KNEE ARTHROPLASTY;  Surgeon: Loreta Ave, MD;  Location: [facility] OR;  Service:  Orthopedics;  Laterality: Right;   TRIGGER FINGER RELEASE Right 04/20/2015   Procedure: RIGHT TRIGGER FINGER RELEASE (TENDON SHEALTH INCISION) ;  Surgeon: Loreta Ave, MD;  Location: [name];  Service: Orthopedics;  Laterality: Right;   Patient Active Problem List   Diagnosis Date Noted   Class 2 severe obesity with serious comorbidity and body mass index (BMI) of 36.0 to 36.9 in adult [facility]) 05/09/2022   Type 2 diabetes mellitus with obesity (HCC) 05/09/2022   Hypertension associated with type 2 diabetes mellitus (HCC) 05/09/2022   SOB (shortness of breath) 12/10/2021   S/P reverse total shoulder arthroplasty, left 02/13/2021   Idiopathic hyperphosphatasia 08/03/2019   Vitamin D deficiency 06/04/2018   Hyponatremia 02/17/2018   History of CVA in adulthood 08/13/2017   Blurry vision 08/13/2017   Primary hypertension 01/02/2017   S/P minimally invasive aortic valve replacement with bioprosthetic valve 08/23/2016   Diastolic dysfunction    Osteopenia 05/27/2016   OSA (obstructive sleep apnea) 02/27/2016   Bruxism 10/23/2015   Elevated alkaline phosphatase level 05/16/2015   Atypical chest pain 11/10/2014   Acne 05/12/2014   Diabetes mellitus (HCC) 05/12/2014   GERD (gastroesophageal reflux disease) 03/10/2014   Generalized anxiety disorder 03/10/2014   Migraines 03/10/2014   EUSTACHIAN TUBE DYSFUNCTION, CHRONIC 06/14/2010   Irritable bowel syndrome 12/15/2008   Low back pain 08/11/2008   Hyperlipidemia associated with type 2 diabetes mellitus (HCC) 02/09/2008   Allergic rhinitis 02/12/2007   Asthma 02/12/2007    PCP: Shelva Majestic, MD  REFERRING PROVIDER: Teryl Lucy, MD  REFERRING DIAG: Rt Shoulder Arthroscopy 12/24/22   THERAPY DIAG:  Stiffness of right shoulder, not elsewhere classified  Muscle weakness (generalized)  S/P reverse total shoulder arthroplasty, left  Localized edema  Rationale for Evaluation and Treatment:  Rehabilitation  ONSET DATE: 12/24/22  SUBJECTIVE:  SUBJECTIVE STATEMENT:   I will graduate next week, I am doing pretty good, still some soreness with activity  Hand dominance: Right  PERTINENT HISTORY: Per order: S/P R shoulder arthroscopy with RC repair, distal clavicle excision and acromioplasty 12/24/22 Supraspinatus complete tear, biceps tendon with significant tearing Surgical: PROCEDURE: Right shoulder arthroscopy with extensive debridement of biceps tendon, superior labrum, subdeltoid bursa, with acromioplasty, distal clavicle resection, and supraspinatus repair   PAIN:  Are you having pain? Yes: NPRS scale: 3/10 Pain location: R shoulder Pain description: aching Aggravating factors: Has trouble getting comfortable at night. Relieving factors: Tramadol at  night, Tylenol in am  PRECAUTIONS: Shoulder  RED FLAGS: None   WEIGHT BEARING RESTRICTIONS: Patient reports none given  FALLS:  Has patient fallen in last 6 months? No  LIVING ENVIRONMENT: Lives with: lives with their family and lives with their spouse Lives in: House/apartment Stairs: Yes: External: 5 steps; on left going up Has following equipment at home: None  OCCUPATION: Retired  PLOF: Independent  PATIENT GOALS:Patient would like to regain her motion and strength in the R arm, be able to sleep  NEXT MD VISIT:   OBJECTIVE:   DIAGNOSTIC FINDINGS:  N/A  COGNITION: Overall cognitive status: Within functional limits for tasks assessed     SENSATION: WFL  POSTURE: Round shoulders, flexed posture, dowager's hump  UPPER EXTREMITY ROM:   P/A ROM Right eval Left eval AA/Act on finger ladder  Standing 02/20/23 RT R shoulder P/AROM 03/10/23 R shoulder P/AROM 04/07/23 AROM  Right shoulder 05/01/23  Shoulder flexion  50/105  130 / 115 137/116 165/130 135  Shoulder extension        Shoulder abduction 46/58  120/ 95 123/92 145/110 120  Shoulder adduction        Shoulder internal rotation WNL       Shoulder external rotation 30/45    67/50 60  Elbow flexion        Elbow extension        Wrist flexion        Wrist extension        Wrist ulnar deviation        Wrist radial deviation        Wrist pronation        Wrist supination        (Blank rows = not tested)  UPPER EXTREMITY MMT: 3-/5 R shoulder. Elbow, wrist at least 3+/5  JOINT MOBILITY TESTING:  G-H deferred, scapula ROM limited, stiff  PALPATION:  TTP over supraspinatus, pects with tihgtness in pects and cervical paraspinals.   TODAY'S TREATMENT:                                                                                                                                         DATE:  05/01/23 Nustep level 5 x 7 minutes 3# wate bar biceps 3# wate bar overhead press Red tband  ER Red tband horizontal abduction 2# shelf reaching Gave new HEP  see below STM to the right sholder Vaso right shoulder low pressure  04/28/23 NuStep L5 x 6 minutes R humeral self mobs, sitting and hold mat with RUE, lean to L with L lateral cervical flexion. Seated press ups with 5 sec hold, x 10 reps. Standing shoulder ext, rows, 10#, 2 x 10 reps each. Standing AR, 10#, 2 x 10 reps each way. Shelf taps, 2# weight, 2 x 10 flex and abd VASO, med pressure, 10 min to R shoulder  04/24/23 NuStep L5 x 6 minutes R shoulder assessment to identify painful tissue- R supraspinatus tendon and biceps tendon both TTP. Seated shoulder self depression holding side of mat and lean to L. 2 x 15 sec Standing doorway lean for R shoulder distraction 2 x 15 sec Seated press ups, 5 sec hold, 10  Wall slides with 1# cuff weight on R, emphasize good shoulder mechanics, x 10 VASO x 10 min, med pressure.  04/22/23 Nustep L 5 Lat pull 25#,Chest press 10# with  serratus,Seated row 25# ,Bicep 15# and  Tricep 25# 2 sets 10 3# cabinet reaching flex and abd 10x 6# cane ex 15 x each Cable pulley IR/ER 2 sets 10 PROM for shoulder flex and abd and ER VASO x 10 min, med pressure  04/17/23 Standing Rt shld performed at start of session AROM Flexion  170  ,abd  150     ,ER 80    and  IR  70 MMT  flexion 4+/5    , abd 4/5      ,ER 4-/5   and IR 4/5 Nustep L 5 8 min 3# cane ex 15 x each way Green tband IR/Er 2 sets 10 Cable pulley shld ext and row 2 sets 10 10# Tricep ext 2 sets 10 Bicep curl 2 sets 10 STM to UT and pect, PROM for shoulder flex and abd and ER VASO x 10 min, med pressure  04/14/23 NuStep L5 x 6 minutes, then 2 x 30 sec L7, emphasize arms Wall slides flex and abd, 2 x 10 each STM to UT and pect, PROM for shoulder flex and abd and ER Resisted ER with G tband, 2 x 10 reps Overhead shoulder flex and ext against wall, 4# weight, 10 reps. VASO, 10 min, R shoulder, med pressure.  04/10/23 Nustep L 5 8 min Reviewed and reissued HEP and performed TBAND ex ( shld ext,row,ER and anti rotation) performed with red but issued red and green tband Wt ball chest press 10x and OH press 10x Modified standing push up 2 sets 10 Dips 2 sets 5 Lat pull 25#,Chest press 10# with serratus,Seated row 25# ,Bicep 15# and  Tricep 25# 2 sets 10 STW and joint mob VASO  04/07/23 NuStep L5 x 8 minutes AAROM with dowel, flex, abd, ext, IR, ER x 10 each Re-assessed goal status, see ROM chart and goals Overhead carry, 2#, x 100' Anti rotation, 10#, 1 x 10 each side. VASO, med pressure, 34, x 10 min, to R shoulder for pain and inflammation relief.  04/03/23 NuStep L5 x 6 minutes Wall slides x 10, flex, scaption, abd STM to R UT, scap mobs and passive stretch into abd and flexion Supine B shoulder flex with 3# WATE bar x 10 R chest press in supine with 3# weight x 10 Supine IR with 3#, 2 x 10 reps Seated shoulder ER, R, Red Tband 2 x 10 Standing B  shoulder ext and IR with 3# WATE bar, 2 x 10 Body Blade shoulder stabilization x 20 sec both vert and horiz in flex and abd VASO x 10 min, med pressure  03/31/23 NuStep L5 x 6 min STM to R cervical paraspinals, RUT, pects, deltoid F/B PROM for all shoulder movements Shelf taps with 2# weight 2 shelves x 10 each. R shoulder flex with 2# weight, reaching to tap wall behind. 10 reps, min A to reach back the last 2-3" VASO to R shoulder x 10 min, med pressure.  03/28/23 Nustep level 5 x 6 minutes STM to the right upper trap and right upper arm Passive stretch right shoulder all motions to end range Supine 3# punches with serratus, 3# isometric circles 2# ER/IR Red tband row Red tband ER/IR Red tband horizontal abduction  03/26/23 NuStep L5 x 6 minutes STM with stretching to capsule ant/post, superior, holds Scapular mobs PROM for shoulder flex abd Shoulder IR with 5#, 2 x 10. Shoulder ER Gtband 2 x 10 reps Standing overhead flex and ext facing and then with back to wall, 3# weight x 10 each. Unable to quite reach the wall when facing away Wall stars with yellow Tband around wrists, x 5 in each direction. VASO to R shoulder x 10 min, med pressure  03/20/23 AROM standing RT shld at [facility] of Session  flex 165  abd 142  ER 80  IR 80 Nustep L 5 Cable pulley IR and ER 2 sets 10 Standing Red Tband flex and abd 2 sets 10 Cable pulleys standing row 2 sets 10 10# Seated chest press with serratus 2 sets 10 Lat Pull with stretch 2 sets 10 STM to R UT, cervical paraspinals, with cervical distraction, scapular mobs and passive stretch into depression, flex, abd, ER VASO to R shoulder for pain and inflammation control x 10 min, med pressure.  03/17/23 NuStep using arms and legs L5 x 6 minutes STM to R UT, cervical paraspinals, with cervical distraction, scapular mobs and passive stretch into depression, flex, abd, ER Seated lat pulls with passive stretch, 15#, 2 x 10 Standing row with rope  for more narrow grip, 2 x 10, 10# Standing overhead flex and ext facing and then with back to wall, 3# weight x 10 each. Unable to quite reach the wall when facing away. Body Blade in 90 felx and abd, horiz and vert, 15 sec each. VASO to R shoulder for pain and inflammation control x 10 min, med pressure.  PATIENT EDUCATION: Education details: POC Person educated: Patient Education method: Explanation Education comprehension: verbalized understanding  HOME EXERCISE PROGRAM: Access Code: [email or web address] URL: [email or web address] Date: 05/01/2023 Prepared by: Stacie Glaze  Exercises - Supine Scapular Protraction in Flexion with Dumbbells  - 1 x daily - 7 x weekly - 3 sets - 10 reps - 3 hold - Supine Shoulder Circles with Weight  - 1 x daily - 7 x weekly - 3 sets - 10 reps - 3 hold - Supine Bilateral Punches  - 1 x daily - 7 x weekly - 3 sets - 10 reps - 3 hold - Supine Shoulder Flexion with Free Weight  - 1 x daily - 7 x weekly - 3 sets - 10 reps - 3 hold  ASSESSMENT:  CLINICAL IMPRESSION:   Patient reports that her shoulder pain remains. Gave newer HEP for her to do at home, she did well with this using 2#, looking at different exercises that she can do at home, used 2#  today she has a 2# weight at home, we did talk and I answered questions regarding after PT  OBJECTIVE IMPAIRMENTS: decreased activity tolerance, decreased balance, decreased coordination, decreased ROM, decreased strength, impaired flexibility, impaired UE functional use, improper body mechanics, postural dysfunction, and pain.   ACTIVITY LIMITATIONS: carrying, lifting, sleeping, and reach over head  PARTICIPATION LIMITATIONS: meal prep, cleaning, laundry, driving, shopping, and community activity  PERSONAL FACTORS: Past/current experiences are also affecting patient's functional outcome.   REHAB POTENTIAL: Good  CLINICAL DECISION MAKING: Evolving/moderate complexity  EVALUATION COMPLEXITY:  Moderate   GOALS: Goals reviewed with patient? Yes  SHORT TERM GOALS: Target date: 02/28/23  I with initial HEP Baseline: Goal status: 02/13/23 MET  LONG TERM GOALS: Target date: 05/05/23  I with final HEP Baseline:  Goal status: 03/10/23-ongoing  03/20/23 evolving  2.  Patient will recover at least 120 degrees of active R shoulder flexion and abduction Baseline: See ROM Goal status: 03/10/23 progressing  03/20/23 ongoing, 04/07/23, met for flex, ongoing for abd   04/17/23 MET  3.  Increase RUE shoulder strength to at least 4/5 Baseline: 3-/5 Goal status: 03/17/23-flex 3+/5, Abd 3/5, 04/07/23- 4-/5 ongoing   04/17/23  met except ER 4-/5  4.  Patient will report the ability to perform all of her normal daily activities with RU. Baseline: Severely limited functionally Goal status: 04/07/23-met  5.  Decrease R shoulder pain to < 2/10 with all activity. Baseline: 4/10 Goal status: 04/07/23-3/10 during exercises, ongoing  PLAN:  PT FREQUENCY: 1-2x/week  PT DURATION: 12 weeks  PLANNED INTERVENTIONS: Therapeutic exercises, Therapeutic activity, Neuromuscular re-education, Balance training, Gait training, Patient/Family education, Self Care, Joint mobilization, Dry Needling, Electrical stimulation, Cryotherapy, Moist heat, Vasopneumatic device, Ionotophoresis 4mg /ml Dexamethasone, and Manual therapy  PLAN FOR NEXT SESSION: assure HEP and self care and D/C next visit Patient Details  Name: KEILLY FATULA MRN: [id number] Date of Birth: [birth year] Referring Provider:  Shelva Majestic, MD  Encounter Date: 05/01/2023  Iona Beard, DPT 05/01/2023, 2:42 PM  [facility] [address] [place], [place], [address]

## 2023-05-02 ENCOUNTER — Ambulatory Visit: Payer: Medicare PPO | Admitting: Internal Medicine

## 2023-05-02 ENCOUNTER — Encounter: Payer: Self-pay | Admitting: Internal Medicine

## 2023-05-02 VITALS — BP 124/80 | HR 81 | Resp 20 | Ht 66.0 in | Wt 207.8 lb

## 2023-05-02 DIAGNOSIS — E871 Hypo-osmolality and hyponatremia: Secondary | ICD-10-CM | POA: Diagnosis not present

## 2023-05-02 DIAGNOSIS — R748 Abnormal levels of other serum enzymes: Secondary | ICD-10-CM

## 2023-05-02 NOTE — Patient Instructions (Addendum)
Please try to reduce the liquids: - 8 oz of Coke 0 in am - 8 oz of Oranjade in am - 8 oz water in the pm  Continue: - Demeclocycline 300 mg 2x a day - NaCl tablets 3 g 2x a day  Try to see if the Irbesartan (Avapro) can be substituted with another medication.  Please stop at the lab.  You should have an endocrinology follow-up appointment in 1 year.

## 2023-05-02 NOTE — Progress Notes (Unsigned)
Patient ID: [name], female   DOB: [birth year], 72 y.o.   MRN: [id number]  HPI  [name] is a 72 y.o.-year-old female, returning for follow-up for hyponatremia and elevated alkaline phosphatase.  She previously saw Dr. Everardo All, last visit with me 1 year ago.  Interim history: She continues to go to the [facility] management clinic. She had shoulder surgery in 12/2022, then  PT for shoulder pain. She had 1 steroid injection - not recently. No dizziness, falls, HAs. She drinks: 16 oz of Coke 0 16 oz of Oranjade 8 oz with pills 8-10 oz unsweetened tea 16 oz water ~8 oz with pills She mentions that she has been drinking more fluids recently because of dysphagia.  She feels that she needs to contact her GI doctor again for esophageal stretching.  Pt has been dx with hyponatremia in 2013.   Possibly related to SIADH per my understanding reviewing Dr. George [name] notes.   She is on: - Demeclocycline 300 mg 2x a day - NaCl tablets 3 g 2x a day  I reviewed pt's more recent sodium levels: Lab Results  Component Value Date   NA 129 (L) 12/19/2022   NA 132 (L) 10/03/2022   NA 134 (L) 04/18/2022   NA 132 (L) 04/03/2022   NA 135 02/14/2022   NA 130 (L) 09/17/2021   NA 135 06/05/2021   NA 136 02/09/2021   NA 132 (L) 10/24/2020   NA 132 (L) 08/15/2020   NA 136 06/09/2020   NA 133 (L) 04/20/2020   NA 131 (L) 12/28/2019   NA 130 (L) 11/18/2019   NA 127 (L) 08/03/2019   NA 133 (L) 06/08/2019   NA 137 02/11/2019   NA 130 (L) 01/20/2019   NA 128 (L) 01/05/2019   NA 131 (L) 06/04/2018   NA 129 (L) 03/24/2018   NA 128 (L) 02/17/2018   NA 128 (L) 02/17/2018   NA 128 (L) 02/12/2018   NA 129 (L) 01/19/2018   NA 130 (L) 11/25/2017   NA 133 (L) 05/23/2017   NA 132 (L) 04/24/2017   NA 132 (L) 01/02/2017   NA 136 09/16/2016   MRI brain (09/01/2017): 1. No acute intracranial process. 2. Subacute small RIGHT inferior occipital lobe infarct. 3. Old small cerebellar infarcts  and mild chronic small vessel ischemic disease.  Per review of the chart, her hyponatremia is not associated with hypochloremia or alkalosis; she had a urine specific gravity of 1.010 in 2021.  She drinks with meals 2/2 esophageal strictures - she has Es stretched occasionally.  No swelling in legs or arms.   No heart, kidney, liver dysfunction.   No hypertrigyceridemia: Lab Results  Component Value Date   CHOL 147 10/03/2022   HDL 72.70 10/03/2022   LDLCALC 61 10/03/2022   LDLDIRECT 67.0 04/03/2022   TRIG 68.0 10/03/2022   CHOLHDL 2 10/03/2022   Pt also has no h/o OP, but does have osteopenia.  No Br CA history.  No dizziness, no HAs, no blurry vision.   Elevated alkaline phosphatase:  Reviewed her alkaline phosphatase levels: Lab Results  Component Value Date   ALKPHOS 119 (H) 10/03/2022   ALKPHOS 118 (H) 04/03/2022   ALKPHOS 251 (H) 06/05/2021   ALKPHOS 149 (H) 08/15/2020   ALKPHOS 171 (H) 04/20/2020   ALKPHOS 151 (H) 11/18/2019   ALKPHOS 160 (H) 09/28/2019   ALKPHOS 192 (H) 08/03/2019   ALKPHOS 155 (H) 06/08/2019   ALKPHOS 151 (H) 02/11/2019   ALKPHOS 128 (H)  01/05/2019   ALKPHOS 151 (H) 06/04/2018   ALKPHOS 144 (H) 02/17/2018   ALKPHOS 164 (H) 01/19/2018   ALKPHOS 146 (H) 11/25/2017   ALKPHOS 138 (H) 05/23/2017   ALKPHOS 163 (H) 04/24/2017   ALKPHOS 213 (H) 09/16/2016   ALKPHOS 151 (H) 08/21/2016   ALKPHOS 156 (H) 05/14/2016   ALKPHOS 150 (H) 11/14/2015   ALKPHOS 188 (H) 05/11/2015   ALKPHOS 200 (H) 11/09/2014   ALKPHOS 172 (H) 05/05/2014   ALKPHOS 145 (H) 12/09/2012   ALKPHOS 145 (H) 04/09/2012   ALKPHOS 119 (H) 08/06/2011   ALKPHOS 110 11/26/2010   ALKPHOS 134 (H) 06/22/2010   ALKPHOS 88 06/05/2010   ALKPHOS 103 03/06/2010   ALKPHOS 105 11/21/2009   ALKPHOS 133 (H) 07/05/2009   ALKPHOS 118 (H) 08/04/2008   ALKPHOS 122 (H) 05/03/2008   ALKPHOS 118 (H) 02/02/2008   ALKPHOS 154 (H) 08/03/2007   Had a high bone specific alk phos: 09/17/2021:  Bone specific alkaline phosphatase 59  However, a bone scan (08/30/2019) was negative for Paget's disease: Multifocal degenerative changes. No evidence of acute or destructive process.  Other LFTs were normal: Lab Results  Component Value Date   ALT 13 10/03/2022   AST 26 10/03/2022   ALKPHOS 119 (H) 10/03/2022   BILITOT 0.4 10/03/2022    No hypercalcemia or hyperparathyroidism: Lab Results  Component Value Date   PTH 22 08/15/2020  . Lab Results  Component Value Date   CALCIUM 8.7 (L) 12/19/2022   CALCIUM 9.3 10/03/2022   CALCIUM 9.3 04/18/2022   CALCIUM 9.2 04/03/2022   CALCIUM 9.4 02/14/2022   CALCIUM 9.1 09/17/2021   CALCIUM 9.1 06/05/2021   CALCIUM 9.3 02/09/2021   CALCIUM 9.4 10/24/2020   CALCIUM 9.7 08/15/2020   CALCIUM 9.4 08/15/2020   CALCIUM 9.6 06/09/2020   CALCIUM 9.4 04/20/2020   CALCIUM 9.4 12/28/2019   CALCIUM 9.0 11/18/2019   CALCIUM 9.0 08/03/2019   CALCIUM 9.1 06/08/2019   CALCIUM 9.3 02/11/2019   CALCIUM 9.6 01/20/2019   CALCIUM 8.9 01/05/2019   CALCIUM 9.1 06/04/2018   CALCIUM 9.3 03/24/2018   CALCIUM 9.4 02/17/2018   CALCIUM 9.6 02/12/2018   CALCIUM 9.2 01/19/2018   CALCIUM 9.4 11/25/2017   CALCIUM 9.3 05/23/2017   CALCIUM 9.2 04/24/2017   CALCIUM 9.4 01/02/2017   CALCIUM 9.2 09/16/2016   She also has a history of DM2 (controlled), HTN, HL, s/p Ao valve replacement, h/o TIA, GERD, gallstones, anxiety. She is taking biotin. Takes a MVI daily, Ca-D  (1000 mg-400 units) + vitamin D (5000 units daily).  Vitamin D level was recently normal: Lab Results  Component Value Date   VD25OH 62.2 03/05/2022   VD25OH 70.1 06/05/2021   VD25OH 74.5 01/25/2021   VD25OH 66.5 04/20/2020   VD25OH 52.21 06/08/2019   VD25OH 56.90 01/05/2019   VD25OH 42.08 06/04/2018   VD25OH 27.9 (L) 01/19/2018   She had sleep apnea surgery ([facility]) since last OV  - Dr. Annalee Genta.   ROS: + see HPI  Past Medical History:  Diagnosis Date   Allergy    Anxiety     Arthritis    back & knee   Asthma     mild per pt shows up with resp illness   Colon polyps    Constipation    Diabetes mellitus without complication (HCC)    Gallstones    Gastric polyps    Gastroparesis    GERD (gastroesophageal reflux disease)    Headache    sinus headaches and  migraines at times   Heart murmur    History of migraine headaches    HTN (hypertension)    Hyperlipidemia    IBS (irritable bowel syndrome)    Joint pain    Lumbar disc disease    PONV (postoperative nausea and vomiting)    S/P aortic valve replacement with bioprosthetic valve 08/23/2016   25 mm Edwards Intuity rapid-deployment bovine pericardial tissue valve via partial upper mini sternotomy   Sleep apnea    does not use CPAP   TIA (transient ischemic attack)    TIA (transient ischemic attack)    hx of per pt   Past Surgical History:  Procedure Laterality Date   ABDOMINAL HYSTERECTOMY     AORTIC VALVE REPLACEMENT N/A 08/23/2016   Procedure: AORTIC VALVE REPLACEMENT (AVR) - using partial Upper Sternotomy- 25mm Edwards Intuity Aortic Valve used;  Surgeon: Purcell Nails, MD;  Location: [facility] OR;  Service: Open Heart Surgery;  Laterality: N/A;   BLADDER SUSPENSION  2007   BREAST ENHANCEMENT SURGERY  1980   CARPAL TUNNEL RELEASE Bilateral    COLONOSCOPY     DORSAL COMPARTMENT RELEASE Right 09/15/2014   Procedure: RIGHT WRIST DEQUERVAINS RELEASE ;  Surgeon: Mckinley Jewel, MD;  Location: [facility];  Service: Orthopedics;  Laterality: Right;   DRUG INDUCED ENDOSCOPY N/A 02/19/2022   Procedure: DRUG INDUCED SLEEP ENDOSCOPY;  Surgeon: Osborn Coho, MD;  Location: [facility];  Service: ENT;  Laterality: N/A;   ESOPHAGOGASTRODUODENOSCOPY     EYE SURGERY     cataract surgery   IMPLANTATION OF HYPOGLOSSAL NERVE STIMULATOR Right 04/23/2022   Procedure: IMPLANTATION OF HYPOGLOSSAL NERVE STIMULATOR;  Surgeon: Osborn Coho, MD;  Location: [facility];  Service:  ENT;  Laterality: Right;   KNEE ARTHROSCOPY  04/15/2012   Procedure: ARTHROSCOPY KNEE;  Surgeon: Loreta Ave, MD;  Location: [facility] OR;  Service: Orthopedics;  Laterality: Right;   LAPAROSCOPIC CHOLECYSTECTOMY     LASIK     OOPHORECTOMY     PALATE TO GINGIVA GRAFT  2017   REVERSE SHOULDER ARTHROPLASTY Left 02/13/2021   Procedure: REVERSE SHOULDER ARTHROPLASTY;  Surgeon: Teryl Lucy, MD;  Location: [facility];  Service: Orthopedics;  Laterality: Left;   RIGHT/LEFT HEART CATH AND CORONARY ANGIOGRAPHY N/A 08/02/2016   Procedure: Right/Left Heart Cath and Coronary Angiography;  Surgeon: Tonny Bollman, MD;  Location: [facility] CV [facility];  Service: Cardiovascular;  Laterality: N/A;   ROTATOR CUFF REPAIR Left    SHOULDER ARTHROSCOPY WITH ROTATOR CUFF REPAIR AND SUBACROMIAL DECOMPRESSION Right 12/24/2022   Procedure: SHOULDER ARTHROSCOPY WITH DEBRIDEMENT, ROTATOR CUFF REPAIR AND SUBACROMIAL DECOMPRESSION;  Surgeon: Teryl Lucy, MD;  Location: [facility];  Service: Orthopedics;  Laterality: Right;   TEE WITHOUT CARDIOVERSION N/A 08/23/2016   Procedure: TRANSESOPHAGEAL ECHOCARDIOGRAM (TEE);  Surgeon: Purcell Nails, MD;  Location: [facility] OR;  Service: Open Heart Surgery;  Laterality: N/A;   TOTAL KNEE ARTHROPLASTY  04/15/2012   Procedure: TOTAL KNEE ARTHROPLASTY;  Surgeon: Loreta Ave, MD;  Location: [facility] OR;  Service: Orthopedics;  Laterality: Right;   TRIGGER FINGER RELEASE Right 04/20/2015   Procedure: RIGHT TRIGGER FINGER RELEASE (TENDON SHEALTH INCISION) ;  Surgeon: Loreta Ave, MD;  Location: [facility];  Service: Orthopedics;  Laterality: Right;   Social History   Socioeconomic History   Marital status: Married    Spouse name: Freida Busman   Number of children: 1   Years of education: Not on file   Highest  education level: Master's degree (e.g., MA, MS, MEng, MEd, MSW, MBA)  Occupational History   Occupation: Retired, Visual merchandiser: RETIRED   Tobacco Use   Smoking status: Never   Smokeless tobacco: Never  Vaping Use   Vaping status: Never Used  Substance and Sexual Activity   Alcohol use: No   Drug use: No   Sexual activity: Yes    Birth control/protection: Surgical    Comment: hyst  Other Topics Concern   Not on file  Social History Narrative   She lives with husband (1978) and two dogs. Step-son Camillia Herter (1971-psychiatrist in [place]). No grandkids. 2 dogs-sheltie/collie mix and border collie mix      Highest level of education:  Master in education   She is retired Clinical biochemist x 32 years.      Hobbies: time with dogs   Right handed   One story home         Social Determinants of Health   Financial Resource Strain: Low Risk  (10/02/2022)   Overall Financial Resource Strain (CARDIA)    Difficulty of Paying Living Expenses: Not hard at all  Food Insecurity: No Food Insecurity (10/02/2022)   Hunger Vital Sign    Worried About Running Out of Food in the Last Year: Never true    Ran Out of Food in the Last Year: Never true  Transportation Needs: No Transportation Needs (10/02/2022)   PRAPARE - Administrator, Civil Service (Medical): No    Lack of Transportation (Non-Medical): No  Physical Activity: Unknown (10/02/2022)   Exercise Vital Sign    Days of Exercise per Week: Patient declined    Minutes of Exercise per Session: Not on file  Stress: No Stress Concern Present (10/02/2022)   [employer] of Occupational Health - Occupational Stress Questionnaire    Feeling of Stress : Not at all  Social Connections: Unknown (10/02/2022)   Social Connection and Isolation Panel [NHANES]    Frequency of Communication with Friends and Family: More than three times a week    Frequency of Social Gatherings with Friends and Family: Twice a week    Attends Religious Services: Patient declined    Database administrator or Organizations: No    Attends Engineer, structural: Not on file    Marital  Status: Married  Catering manager Violence: Not At Risk (04/21/2020)   Humiliation, Afraid, Rape, and Kick questionnaire    Fear of Current or Ex-Partner: No    Emotionally Abused: No    Physically Abused: No    Sexually Abused: No   Current Outpatient Medications on File Prior to Visit  Medication Sig Dispense Refill   Accu-Chek Softclix Lancets lancets USE AS INSTRUCTED 100 each 12   amLODipine (NORVASC) 10 MG tablet Take 1 tablet (10 mg total) by mouth daily. 90 tablet 1   atorvastatin (LIPITOR) 40 MG tablet TAKE 1 TABLET BY MOUTH DAILY AT 6 PM. 90 tablet 3   Biotin 5 MG TBDP SMARTSIG:1 By Mouth     blood glucose meter kit and supplies KIT Dispense based on patient and insurance preference. Use up to four times daily as directed. Dx: E11.9 1 each 0   calcium-vitamin D (OSCAL WITH D) 500-200 MG-UNIT per tablet Take 2 tablets by mouth daily.      cloNIDine (CATAPRES) 0.1 MG tablet Take 1 tablet (0.1 mg total) by mouth at bedtime. 90 tablet 1   clopidogrel (PLAVIX) 75 MG tablet  TAKE 1 TABLET BY MOUTH EVERY DAY 90 tablet 3   D-5000 125 MCG (5000 UT) TABS SMARTSIG:1 Tablet(s) By Mouth     demeclocycline (DECLOMYCIN) 300 MG tablet Take 1 tablet (300 mg total) by mouth 2 (two) times daily. 180 tablet 3   fluticasone (FLONASE) 50 MCG/ACT nasal spray Place 2 sprays into both nostrils daily.     glucose blood (ACCU-CHEK GUIDE) test strip Use to test blood sugars daily. Dx: E11.9 100 strip 12   ipratropium (ATROVENT) 0.03 % nasal spray 2 SPRAYS IN EACH NOSTRIL AS NEEDED THREE TIMES AS NEEDED     irbesartan (AVAPRO) 300 MG tablet Take 1 tablet (300 mg total) by mouth daily. 90 tablet 3   loratadine (CLARITIN) 10 MG tablet Take 10 mg by mouth at bedtime.      Melatonin 5 MG TABS Take 5 mg by mouth at bedtime as needed (sleep).     metFORMIN (GLUCOPHAGE) 500 MG tablet TAKE 1 TABLET BY MOUTH WITH BREAKFAST AND 1/2 TO 1 TABLET WITH DINNER (Patient taking differently: Take 500 mg by mouth daily. Taking 1  tablet one time daily.) 180 tablet 1   metoCLOPramide (REGLAN) 10 MG tablet TAKE 1/2 TABLET BY MOUTH 4 TIMES A DAY 60 tablet 3   metroNIDAZOLE (METROCREAM) 0.75 % cream      Multiple Vitamins-Minerals (MULTIVITAMIN ADULTS) TABS SMARTSIG:1 By Mouth     omeprazole (PRILOSEC) 40 MG capsule Take 1 capsule by mouth daily.     Probiotic Product (ALIGN) 4 MG CAPS Take 4 mg by mouth daily.      psyllium (METAMUCIL SMOOTH TEXTURE) 28 % packet Take 1 packet by mouth 2 (two) times daily.     sennosides-docusate sodium (SENOKOT-S) 8.6-50 MG tablet Take 2 tablets by mouth daily. 30 tablet 1   sodium chloride 1 g tablet Take 3 g by mouth 2 (two) times daily with a meal.     venlafaxine XR (EFFEXOR-XR) 75 MG 24 hr capsule TAKE 1 CAPSULE BY MOUTH DAILY WITH BREAKFAST. 90 capsule 2   No current facility-administered medications on file prior to visit.   Allergies  Allergen Reactions   Augmentin [Amoxicillin-Pot Clavulanate] Rash   Erythromycin Nausea Only   Penicillins Itching and Rash    Has patient had a PCN reaction causing immediate rash, facial/tongue/throat swelling, SOB or lightheadedness with hypotension: No Has patient had a PCN reaction causing severe rash involving mucus membranes or skin necrosis: No Has patient had a PCN reaction that required hospitalization: No Has patient had a PCN reaction occurring within the last 10 years: No  If all of the above answers are "NO", then may proceed with Cephalosporin use. Tolerated Ancef 02/13/21   Family History  Problem Relation Age of Onset   COPD Mother    Colon polyps Mother    Irritable bowel syndrome Mother    Anxiety disorder Mother    Heart disease Father    Alcohol abuse Father    Breast cancer Maternal Grandmother    Gallbladder disease Maternal Grandmother    Colon polyps Maternal Aunt    Diabetes Paternal Uncle        several uncles   Other Neg Hx        hyponatremia   Colon cancer Neg Hx    Esophageal cancer Neg Hx    Rectal  cancer Neg Hx    Stomach cancer Neg Hx    PE: BP 124/80 (BP Location: Left Arm, Patient Position: Sitting, Cuff Size: Normal)  Pulse 81   Resp 20   Ht 5\' 6"  (1.676 m)   Wt 207 lb 12.8 oz (94.3 kg)   LMP  (LMP Unknown)   SpO2 99%   BMI 33.54 kg/m  Wt Readings from Last 10 Encounters:  05/02/23 207 lb 12.8 oz (94.3 kg)  04/29/23 201 lb (91.2 kg)  04/01/23 205 lb (93 kg)  12/24/22 199 lb 4.7 oz (90.4 kg)  11/26/22 204 lb 12.8 oz (92.9 kg)  10/14/22 200 lb (90.7 kg)  10/08/22 194 lb (88 kg)  10/03/22 198 lb 6.4 oz (90 kg)  10/02/22 199 lb 3.2 oz (90.4 kg)  09/19/22 200 lb (90.7 kg)   Constitutional: overweight, in NAD Eyes:  EOMI, no exophthalmos ENT: no neck masses, no cervical lymphadenopathy Cardiovascular: RRR, No MRG Respiratory: CTA B Musculoskeletal: no deformities Skin:no rashes Neurological: + tremor with outstretched hands  ASSESSMENT: 1. Hyponatremia -Patient previously seen by Dr. Everardo All.  She has a more than 10-year history of mild hyponatremia, likely due to SIADH. -Of note, previous investigation did not reveal a clear etiology: Normal cosyntropin stimulation test, normal thyroid test, no history of cancer.  Also, she did not have pseudo hyponatremia (no increased triglycerides of proteins), or dilutional hyponatremia (normal BNP, liver and kidney tests), also no hypochloremia or low specific urine gravity to suggest water overload, diarrhea/vomiting, longstanding diuretic use.  Previous MRI from 2021, showed a small occipital stroke and small white matter disease, but no other pathology. -Her sugars are mostly fluctuating between 130 and 136, but later was lower, at 129 in 11/2022.  Upon questioning, she mentions that her swallowing has been worsening and she feels that she needs another esophageal stretching.  She has been drinking more water to get the food down and I believe that this could be the reason for her progressively low sodium level over the last year.   At today's visit, we reviewed her liquid intake and this is quite significant.  I advised her to try to reduce the amount by 24 ounces a day. -we will repeat the Na level today -She is on demeclocycline 300 mg twice a day and sodium chloride tablets 3 g twice a day (she gets this from [employer]). For now, we will continue the current regimen - I will see the patient back in 1 year  2.  Elevated alkaline phosphatase -Chronic, idiopathic -Bone scan performed 08/23/2019 did not show pagetoid changes, only degenerative changes -We are continuing to follow her without intervention -Latest alkaline phosphatase values were lower than before, latest being 119, lowest in 13 years - will repeat this today -Of note, vitamin D level was normal at last check a year ago - on vitamin D 5000 units daily + the amount in the multivitamin - will repeat this today  Orders Placed This Encounter  Procedures   Vitamin D, 25-hydroxy   Comprehensive metabolic panel   Carlus Pavlov, MD PhD [facility]

## 2023-05-03 LAB — COMPREHENSIVE METABOLIC PANEL
AG Ratio: 2 (calc) (ref 1.0–2.5)
ALT: 14 U/L (ref 6–29)
AST: 25 U/L (ref 10–35)
Albumin: 4.3 g/dL (ref 3.6–5.1)
Alkaline phosphatase (APISO): 158 U/L — ABNORMAL HIGH (ref 37–153)
BUN: 18 mg/dL (ref 7–25)
CO2: 26 mmol/L (ref 20–32)
Calcium: 8.9 mg/dL (ref 8.6–10.4)
Chloride: 101 mmol/L (ref 98–110)
Creat: 0.68 mg/dL (ref 0.60–1.00)
Globulin: 2.1 g/dL (ref 1.9–3.7)
Glucose, Bld: 127 mg/dL — ABNORMAL HIGH (ref 65–99)
Potassium: 4.9 mmol/L (ref 3.5–5.3)
Sodium: 137 mmol/L (ref 135–146)
Total Bilirubin: 0.4 mg/dL (ref 0.2–1.2)
Total Protein: 6.4 g/dL (ref 6.1–8.1)

## 2023-05-03 LAB — VITAMIN D 25 HYDROXY (VIT D DEFICIENCY, FRACTURES): Vit D, 25-Hydroxy: 49 ng/mL (ref 30–100)

## 2023-05-04 ENCOUNTER — Other Ambulatory Visit: Payer: Self-pay | Admitting: Family Medicine

## 2023-05-05 ENCOUNTER — Ambulatory Visit: Payer: Medicare PPO | Admitting: Physical Therapy

## 2023-05-05 ENCOUNTER — Encounter: Payer: Self-pay | Admitting: Internal Medicine

## 2023-05-05 ENCOUNTER — Encounter: Payer: Self-pay | Admitting: Physical Therapy

## 2023-05-05 DIAGNOSIS — M25611 Stiffness of right shoulder, not elsewhere classified: Secondary | ICD-10-CM | POA: Diagnosis not present

## 2023-05-05 DIAGNOSIS — Z96612 Presence of left artificial shoulder joint: Secondary | ICD-10-CM | POA: Diagnosis not present

## 2023-05-05 DIAGNOSIS — M6281 Muscle weakness (generalized): Secondary | ICD-10-CM | POA: Diagnosis not present

## 2023-05-05 DIAGNOSIS — R600 Localized edema: Secondary | ICD-10-CM

## 2023-05-05 NOTE — Therapy (Signed)
OUTPATIENT PHYSICAL THERAPY SHOULDER   PHYSICAL THERAPY DISCHARGE SUMMARY  Visits from Start of Care: 26  Current functional level related to goals / functional outcomes: Goals met   Remaining deficits: Mild weakness remains, improving   Education / Equipment: HEP   Patient agrees to discharge. Patient goals were met. Patient is being discharged due to meeting the stated rehab goals.    Patient Name: [name] MRN: [id number] DOB:1950/09/01, 72 y.o., female Today's Date: 05/05/2023  END OF SESSION:  PT End of Session - 05/05/23 1455     Visit Number 25    Date for PT Re-Evaluation 05/05/23    PT Start Time 1450    PT Stop Time 1535    PT Time Calculation (min) 45 min    Activity Tolerance Patient tolerated treatment well    Behavior During Therapy WFL for tasks assessed/performed              Past Medical History:  Diagnosis Date   Allergy    Anxiety    Arthritis    back & knee   Asthma     mild per pt shows up with resp illness   Colon polyps    Constipation    Diabetes mellitus without complication (HCC)    Gallstones    Gastric polyps    Gastroparesis    GERD (gastroesophageal reflux disease)    Headache    sinus headaches and migraines at times   Heart murmur    History of migraine headaches    HTN (hypertension)    Hyperlipidemia    IBS (irritable bowel syndrome)    Joint pain    Lumbar disc disease    PONV (postoperative nausea and vomiting)    S/P aortic valve replacement with bioprosthetic valve 08/23/2016   25 mm Edwards Intuity rapid-deployment bovine pericardial tissue valve via partial upper mini sternotomy   Sleep apnea    does not use CPAP   TIA (transient ischemic attack)    TIA (transient ischemic attack)    hx of per pt   Past Surgical History:  Procedure Laterality Date   ABDOMINAL HYSTERECTOMY     AORTIC VALVE REPLACEMENT N/A 08/23/2016   Procedure: AORTIC VALVE REPLACEMENT (AVR) - using partial Upper Sternotomy-  25mm Edwards Intuity Aortic Valve used;  Surgeon: Purcell Nails, MD;  Location: [facility] OR;  Service: Open Heart Surgery;  Laterality: N/A;   BLADDER SUSPENSION  2007   BREAST ENHANCEMENT SURGERY  1980   CARPAL TUNNEL RELEASE Bilateral    COLONOSCOPY     DORSAL COMPARTMENT RELEASE Right 09/15/2014   Procedure: RIGHT WRIST DEQUERVAINS RELEASE ;  Surgeon: Mckinley Jewel, MD;  Location: [facility];  Service: Orthopedics;  Laterality: Right;   DRUG INDUCED ENDOSCOPY N/A 02/19/2022   Procedure: DRUG INDUCED SLEEP ENDOSCOPY;  Surgeon: Osborn Coho, MD;  Location: [facility];  Service: ENT;  Laterality: N/A;   ESOPHAGOGASTRODUODENOSCOPY     EYE SURGERY     cataract surgery   IMPLANTATION OF HYPOGLOSSAL NERVE STIMULATOR Right 04/23/2022   Procedure: IMPLANTATION OF HYPOGLOSSAL NERVE STIMULATOR;  Surgeon: Osborn Coho, MD;  Location: [facility];  Service: ENT;  Laterality: Right;   KNEE ARTHROSCOPY  04/15/2012   Procedure: ARTHROSCOPY KNEE;  Surgeon: Loreta Ave, MD;  Location: [facility] OR;  Service: Orthopedics;  Laterality: Right;   LAPAROSCOPIC CHOLECYSTECTOMY     LASIK     OOPHORECTOMY     PALATE TO GINGIVA GRAFT  2017   REVERSE SHOULDER ARTHROPLASTY Left 02/13/2021   Procedure: REVERSE SHOULDER ARTHROPLASTY;  Surgeon: Teryl Lucy, MD;  Location: [facility];  Service: Orthopedics;  Laterality: Left;   RIGHT/LEFT HEART CATH AND CORONARY ANGIOGRAPHY N/A 08/02/2016   Procedure: Right/Left Heart Cath and Coronary Angiography;  Surgeon: Tonny Bollman, MD;  Location: [facility] INVASIVE CV LAB;  Service: Cardiovascular;  Laterality: N/A;   ROTATOR CUFF REPAIR Left    SHOULDER ARTHROSCOPY WITH ROTATOR CUFF REPAIR AND SUBACROMIAL DECOMPRESSION Right 12/24/2022   Procedure: SHOULDER ARTHROSCOPY WITH DEBRIDEMENT, ROTATOR CUFF REPAIR AND SUBACROMIAL DECOMPRESSION;  Surgeon: Teryl Lucy, MD;  Location: [facility];  Service: Orthopedics;  Laterality: Right;    TEE WITHOUT CARDIOVERSION N/A 08/23/2016   Procedure: TRANSESOPHAGEAL ECHOCARDIOGRAM (TEE);  Surgeon: Purcell Nails, MD;  Location: [facility] OR;  Service: Open Heart Surgery;  Laterality: N/A;   TOTAL KNEE ARTHROPLASTY  04/15/2012   Procedure: TOTAL KNEE ARTHROPLASTY;  Surgeon: Loreta Ave, MD;  Location: [facility] OR;  Service: Orthopedics;  Laterality: Right;   TRIGGER FINGER RELEASE Right 04/20/2015   Procedure: RIGHT TRIGGER FINGER RELEASE (TENDON SHEALTH INCISION) ;  Surgeon: Loreta Ave, MD;  Location: [facility];  Service: Orthopedics;  Laterality: Right;   Patient Active Problem List   Diagnosis Date Noted   Class 2 severe obesity with serious comorbidity and body mass index (BMI) of 36.0 to 36.9 in adult [facility]) 05/09/2022   Type 2 diabetes mellitus with obesity (HCC) 05/09/2022   Hypertension associated with type 2 diabetes mellitus (HCC) 05/09/2022   SOB (shortness of breath) 12/10/2021   S/P reverse total shoulder arthroplasty, left 02/13/2021   Idiopathic hyperphosphatasia 08/03/2019   Vitamin D deficiency 06/04/2018   Hyponatremia 02/17/2018   History of CVA in adulthood 08/13/2017   Blurry vision 08/13/2017   Primary hypertension 01/02/2017   S/P minimally invasive aortic valve replacement with bioprosthetic valve 08/23/2016   Diastolic dysfunction    Osteopenia 05/27/2016   OSA (obstructive sleep apnea) 02/27/2016   Bruxism 10/23/2015   Elevated alkaline phosphatase level 05/16/2015   Atypical chest pain 11/10/2014   Acne 05/12/2014   Diabetes mellitus (HCC) 05/12/2014   GERD (gastroesophageal reflux disease) 03/10/2014   Generalized anxiety disorder 03/10/2014   Migraines 03/10/2014   EUSTACHIAN TUBE DYSFUNCTION, CHRONIC 06/14/2010   Irritable bowel syndrome 12/15/2008   Low back pain 08/11/2008   Hyperlipidemia associated with type 2 diabetes mellitus (HCC) 02/09/2008   Allergic rhinitis 02/12/2007   Asthma 02/12/2007    PCP: Shelva Majestic,  MD  REFERRING PROVIDER: Teryl Lucy, MD  REFERRING DIAG: Rt Shoulder Arthroscopy 12/24/22   THERAPY DIAG:  Stiffness of right shoulder, not elsewhere classified  Muscle weakness (generalized)  S/P reverse total shoulder arthroplasty, left  Localized edema  Rationale for Evaluation and Treatment: Rehabilitation  ONSET DATE: 12/24/22  SUBJECTIVE:  SUBJECTIVE STATEMENT:  Patient is excited about wrapping up her PT. She reports that the new exercises are effective.  Hand dominance: Right  PERTINENT HISTORY: Per order: S/P R shoulder arthroscopy with RC repair, distal clavicle excision and acromioplasty 12/24/22 Supraspinatus complete tear, biceps tendon with significant tearing Surgical: PROCEDURE: Right shoulder arthroscopy with extensive debridement of biceps tendon, superior labrum, subdeltoid bursa, with acromioplasty, distal clavicle resection, and supraspinatus repair   PAIN:  Are you having pain? Yes: NPRS scale: 3/10 Pain location: R shoulder Pain description: aching Aggravating factors: Has trouble getting comfortable at night. Relieving factors: Tramadol at  night, Tylenol in am  PRECAUTIONS: Shoulder  RED FLAGS: None   WEIGHT BEARING RESTRICTIONS: Patient reports none given  FALLS:  Has patient fallen in last 6 months? No  LIVING ENVIRONMENT: Lives with: lives with their family and lives with their spouse Lives in: House/apartment Stairs: Yes: External: 5 steps; on left going up Has following equipment at home: None  OCCUPATION: Retired  PLOF: Independent  PATIENT GOALS:Patient would like to regain her motion and strength in the R arm, be able to sleep  NEXT MD VISIT:   OBJECTIVE:   DIAGNOSTIC FINDINGS:  N/A  COGNITION: Overall cognitive status: Within functional  limits for tasks assessed     SENSATION: WFL  POSTURE: Round shoulders, flexed posture, dowager's hump  UPPER EXTREMITY ROM:   P/A ROM Right eval Left eval AA/Act on finger ladder  Standing 02/20/23 RT R shoulder P/AROM 03/10/23 R shoulder P/AROM 04/07/23 AROM  Right shoulder 05/01/23  Shoulder flexion 50/105  130 / 115 137/116 165/130 135  Shoulder extension        Shoulder abduction 46/58  120/ 95 123/92 145/110 120  Shoulder adduction        Shoulder internal rotation WNL       Shoulder external rotation 30/45    67/50 60  Elbow flexion        Elbow extension        Wrist flexion        Wrist extension        Wrist ulnar deviation        Wrist radial deviation        Wrist pronation        Wrist supination        (Blank rows = not tested)  UPPER EXTREMITY MMT: 3-/5 R shoulder. Elbow, wrist at least 3+/5  JOINT MOBILITY TESTING:  G-H deferred, scapula ROM limited, stiff  PALPATION:  TTP over supraspinatus, pects with tihgtness in pects and cervical paraspinals.   TODAY'S TREATMENT:                                                                                                                                         DATE:  05/05/23 NuStep L5 x 6 minutes Goals re-assessed. 3# WATE Bar shoulder exercises. Shoulder ext and elbow flexion  2 x 10 Shoulder flex and abd with 2# weights, 2 x 10 reps Overhead shoulder flex against wall with 2# weight in BUE 2 x 10 2# shelf taps in flex and abd, 10 reps each Vaso x 10 minutes, med pressure to R shoulder.  05/01/23 Nustep level 5 x 7 minutes 3# wate bar biceps 3# wate bar overhead press Red tband ER Red tband horizontal abduction 2# shelf reaching Gave new HEP  see below STM to the right sholder Vaso right shoulder low pressure  04/28/23 NuStep L5 x 6 minutes R humeral self mobs, sitting and hold mat with RUE, lean to L with L lateral cervical flexion. Seated press ups with 5 sec hold, x 10 reps. Standing  shoulder ext, rows, 10#, 2 x 10 reps each. Standing AR, 10#, 2 x 10 reps each way. Shelf taps, 2# weight, 2 x 10 flex and abd VASO, med pressure, 10 min to R shoulder  04/24/23 NuStep L5 x 6 minutes R shoulder assessment to identify painful tissue- R supraspinatus tendon and biceps tendon both TTP. Seated shoulder self depression holding side of mat and lean to L. 2 x 15 sec Standing doorway lean for R shoulder distraction 2 x 15 sec Seated press ups, 5 sec hold, 10  Wall slides with 1# cuff weight on R, emphasize good shoulder mechanics, x 10 VASO x 10 min, med pressure.  04/22/23 Nustep L 5 Lat pull 25#,Chest press 10# with serratus,Seated row 25# ,Bicep 15# and  Tricep 25# 2 sets 10 3# cabinet reaching flex and abd 10x 6# cane ex 15 x each Cable pulley IR/ER 2 sets 10 PROM for shoulder flex and abd and ER VASO x 10 min, med pressure  04/17/23 Standing Rt shld performed at start of session AROM Flexion  170  ,abd  150     ,ER 80    and  IR  70 MMT  flexion 4+/5    , abd 4/5      ,ER 4-/5   and IR 4/5 Nustep L 5 8 min 3# cane ex 15 x each way Green tband IR/Er 2 sets 10 Cable pulley shld ext and row 2 sets 10 10# Tricep ext 2 sets 10 Bicep curl 2 sets 10 STM to UT and pect, PROM for shoulder flex and abd and ER VASO x 10 min, med pressure  04/14/23 NuStep L5 x 6 minutes, then 2 x 30 sec L7, emphasize arms Wall slides flex and abd, 2 x 10 each STM to UT and pect, PROM for shoulder flex and abd and ER Resisted ER with G tband, 2 x 10 reps Overhead shoulder flex and ext against wall, 4# weight, 10 reps. VASO, 10 min, R shoulder, med pressure.  04/10/23 Nustep L 5 8 min Reviewed and reissued HEP and performed TBAND ex ( shld ext,row,ER and anti rotation) performed with red but issued red and green tband Wt ball chest press 10x and OH press 10x Modified standing push up 2 sets 10 Dips 2 sets 5 Lat pull 25#,Chest press 10# with serratus,Seated row 25# ,Bicep 15# and   Tricep 25# 2 sets 10 STW and joint mob VASO  04/07/23 NuStep L5 x 8 minutes AAROM with dowel, flex, abd, ext, IR, ER x 10 each Re-assessed goal status, see ROM chart and goals Overhead carry, 2#, x 100' Anti rotation, 10#, 1 x 10 each side. VASO, med pressure, 34, x 10 min, to R shoulder for pain and inflammation relief.  04/03/23 NuStep L5 x 6 minutes Wall slides x 10, flex, scaption, abd STM to R UT, scap mobs and passive stretch into abd and flexion Supine B shoulder flex with 3# WATE bar x 10 R chest press in supine with 3# weight x 10 Supine IR with 3#, 2 x 10 reps Seated shoulder ER, R, Red Tband 2 x 10 Standing B shoulder ext and IR with 3# WATE bar, 2 x 10 Body Blade shoulder stabilization x 20 sec both vert and horiz in flex and abd VASO x 10 min, med pressure  03/31/23 NuStep L5 x 6 min STM to R cervical paraspinals, RUT, pects, deltoid F/B PROM for all shoulder movements Shelf taps with 2# weight 2 shelves x 10 each. R shoulder flex with 2# weight, reaching to tap wall behind. 10 reps, min A to reach back the last 2-3" VASO to R shoulder x 10 min, med pressure.  03/28/23 Nustep level 5 x 6 minutes STM to the right upper trap and right upper arm Passive stretch right shoulder all motions to end range Supine 3# punches with serratus, 3# isometric circles 2# ER/IR Red tband row Red tband ER/IR Red tband horizontal abduction  03/26/23 NuStep L5 x 6 minutes STM with stretching to capsule ant/post, superior, holds Scapular mobs PROM for shoulder flex abd Shoulder IR with 5#, 2 x 10. Shoulder ER Gtband 2 x 10 reps Standing overhead flex and ext facing and then with back to wall, 3# weight x 10 each. Unable to quite reach the wall when facing away Wall stars with yellow Tband around wrists, x 5 in each direction. VASO to R shoulder x 10 min, med pressure  03/20/23 AROM standing RT shld at [facility] of Session  flex 165  abd 142  ER 80  IR 80 Nustep L 5 Cable  pulley IR and ER 2 sets 10 Standing Red Tband flex and abd 2 sets 10 Cable pulleys standing row 2 sets 10 10# Seated chest press with serratus 2 sets 10 Lat Pull with stretch 2 sets 10 STM to R UT, cervical paraspinals, with cervical distraction, scapular mobs and passive stretch into depression, flex, abd, ER VASO to R shoulder for pain and inflammation control x 10 min, med pressure.  03/17/23 NuStep using arms and legs L5 x 6 minutes STM to R UT, cervical paraspinals, with cervical distraction, scapular mobs and passive stretch into depression, flex, abd, ER Seated lat pulls with passive stretch, 15#, 2 x 10 Standing row with rope for more narrow grip, 2 x 10, 10# Standing overhead flex and ext facing and then with back to wall, 3# weight x 10 each. Unable to quite reach the wall when facing away. Body Blade in 90 felx and abd, horiz and vert, 15 sec each. VASO to R shoulder for pain and inflammation control x 10 min, med pressure.  PATIENT EDUCATION: Education details: POC Person educated: Patient Education method: Explanation Education comprehension: verbalized understanding  HOME EXERCISE PROGRAM: Access Code: [email or web address] URL: [email or web address] Date: 05/01/2023 Prepared by: Stacie Glaze  Exercises - Supine Scapular Protraction in Flexion with Dumbbells  - 1 x daily - 7 x weekly - 3 sets - 10 reps - 3 hold - Supine Shoulder Circles with Weight  - 1 x daily - 7 x weekly - 3 sets - 10 reps - 3 hold - Supine Bilateral Punches  - 1 x daily - 7 x weekly - 3 sets - 10 reps - 3 hold - Supine  Shoulder Flexion with Free Weight  - 1 x daily - 7 x weekly - 3 sets - 10 reps - 3 hold  ASSESSMENT:  CLINICAL IMPRESSION:   Patient reports that her shoulder pain is pretty much resolved except after doing her new exercises. Goals re-assessed and met. HEP had been updated last visit and is challenging to patient. She is pleased with her progress. D/C PT.  OBJECTIVE  IMPAIRMENTS: decreased activity tolerance, decreased balance, decreased coordination, decreased ROM, decreased strength, impaired flexibility, impaired UE functional use, improper body mechanics, postural dysfunction, and pain.   ACTIVITY LIMITATIONS: carrying, lifting, sleeping, and reach over head  PARTICIPATION LIMITATIONS: meal prep, cleaning, laundry, driving, shopping, and community activity  PERSONAL FACTORS: Past/current experiences are also affecting patient's functional outcome.   REHAB POTENTIAL: Good  CLINICAL DECISION MAKING: Evolving/moderate complexity  EVALUATION COMPLEXITY: Moderate   GOALS: Goals reviewed with patient? Yes  SHORT TERM GOALS: Target date: 02/28/23  I with initial HEP Baseline: Goal status: 02/13/23 MET  LONG TERM GOALS: Target date: 05/05/23  I with final HEP Baseline:  Goal status: 03/10/23-ongoing  03/20/23 evolving, 05/05/23- met  2.  Patient will recover at least 120 degrees of active R shoulder flexion and abduction Baseline: See ROM Goal status: 03/10/23 progressing  03/20/23 ongoing, 04/07/23, met for flex, ongoing for abd   04/17/23 MET  3.  Increase RUE shoulder strength to at least 4/5 Baseline: 3-/5 Goal status: 03/17/23-flex 3+/5, Abd 3/5, 04/07/23- 4-/5 ongoing   04/17/23  met except ER 4-/5, 05/05/23- met  4.  Patient will report the ability to perform all of her normal daily activities with RU. Baseline: Severely limited functionally Goal status: 04/07/23-met  5.  Decrease R shoulder pain to < 2/10 with all activity. Baseline: 4/10 Goal status: 04/07/23-3/10 during exercises, ongoing, 05/05/23- Patient reports some DOMS after exercising, but otherwise no rel pain. Met  PLAN:  PT FREQUENCY: 1-2x/week  PT DURATION: 12 weeks  PLANNED INTERVENTIONS: Therapeutic exercises, Therapeutic activity, Neuromuscular re-education, Balance training, Gait training, Patient/Family education, Self Care, Joint mobilization, Dry Needling,  Electrical stimulation, Cryotherapy, Moist heat, Vasopneumatic device, Ionotophoresis 4mg /ml Dexamethasone, and Manual therapy  PLAN FOR NEXT SESSION: assure HEP and self care and D/C next visit Patient Details  Name: AVNI VOTA MRN: [id number] Date of Birth: 1951/06/24 Referring Provider:  Shelva Majestic, MD  Encounter Date: 05/05/2023  Iona Beard, DPT 05/05/2023, 3:25 PM  [facility] [address] [place], [place], [address]

## 2023-05-08 ENCOUNTER — Ambulatory Visit (INDEPENDENT_AMBULATORY_CARE_PROVIDER_SITE_OTHER): Payer: Medicare PPO | Admitting: Family Medicine

## 2023-05-08 ENCOUNTER — Encounter: Payer: Self-pay | Admitting: Family Medicine

## 2023-05-08 ENCOUNTER — Other Ambulatory Visit: Payer: Self-pay | Admitting: Family Medicine

## 2023-05-08 VITALS — BP 110/74 | HR 77 | Temp 97.0°F | Ht 66.0 in | Wt 202.8 lb

## 2023-05-08 DIAGNOSIS — R351 Nocturia: Secondary | ICD-10-CM

## 2023-05-08 DIAGNOSIS — E785 Hyperlipidemia, unspecified: Secondary | ICD-10-CM | POA: Diagnosis not present

## 2023-05-08 DIAGNOSIS — Z953 Presence of xenogenic heart valve: Secondary | ICD-10-CM

## 2023-05-08 DIAGNOSIS — E119 Type 2 diabetes mellitus without complications: Secondary | ICD-10-CM

## 2023-05-08 DIAGNOSIS — E1169 Type 2 diabetes mellitus with other specified complication: Secondary | ICD-10-CM

## 2023-05-08 DIAGNOSIS — Z7984 Long term (current) use of oral hypoglycemic drugs: Secondary | ICD-10-CM | POA: Diagnosis not present

## 2023-05-08 DIAGNOSIS — I10 Essential (primary) hypertension: Secondary | ICD-10-CM

## 2023-05-08 DIAGNOSIS — E559 Vitamin D deficiency, unspecified: Secondary | ICD-10-CM

## 2023-05-08 DIAGNOSIS — T8203XA Leakage of heart valve prosthesis, initial encounter: Secondary | ICD-10-CM

## 2023-05-08 DIAGNOSIS — Z0000 Encounter for general adult medical examination without abnormal findings: Secondary | ICD-10-CM

## 2023-05-08 LAB — LIPID PANEL
Cholesterol: 152 mg/dL (ref 0–200)
HDL: 60.6 mg/dL (ref >39.00)
LDL Cholesterol: 73 mg/dL (ref 0–99)
NonHDL: 91.35
Total CHOL/HDL Ratio: 3
Triglycerides: 92 mg/dL (ref 0.0–149.0)
VLDL: 18.4 mg/dL (ref 0.0–40.0)

## 2023-05-08 LAB — CBC WITH DIFFERENTIAL/PLATELET
Basophils Absolute: 0.1 10*3/uL (ref 0.0–0.1)
Basophils Relative: 0.9 % (ref 0.0–3.0)
Eosinophils Absolute: 0.1 10*3/uL (ref 0.0–0.7)
Eosinophils Relative: 0.7 % (ref 0.0–5.0)
HCT: 35.5 % — ABNORMAL LOW (ref 36.0–46.0)
Hemoglobin: 11.3 g/dL — ABNORMAL LOW (ref 12.0–15.0)
Lymphocytes Relative: 25 % (ref 12.0–46.0)
Lymphs Abs: 2.3 10*3/uL (ref 0.7–4.0)
MCHC: 32 g/dL (ref 30.0–36.0)
MCV: 78.2 fL (ref 78.0–100.0)
Monocytes Absolute: 0.7 10*3/uL (ref 0.1–1.0)
Monocytes Relative: 7.5 % (ref 3.0–12.0)
Neutro Abs: 6.1 10*3/uL (ref 1.4–7.7)
Neutrophils Relative %: 65.9 % (ref 43.0–77.0)
Platelets: 381 10*3/uL (ref 150.0–400.0)
RBC: 4.54 Mil/uL (ref 3.87–5.11)
RDW: 16.3 % — ABNORMAL HIGH (ref 11.5–15.5)
WBC: 9.2 10*3/uL (ref 4.0–10.5)

## 2023-05-08 LAB — HEMOGLOBIN A1C: Hgb A1c MFr Bld: 7 % — ABNORMAL HIGH (ref 4.6–6.5)

## 2023-05-08 LAB — MICROALBUMIN / CREATININE URINE RATIO
Creatinine,U: 127.8 mg/dL
Microalb Creat Ratio: 2.4 mg/g (ref 0.0–30.0)
Microalb, Ur: 3 mg/dL — ABNORMAL HIGH (ref 0.0–1.9)

## 2023-05-08 MED ORDER — VENLAFAXINE HCL ER 75 MG PO CP24: 75.0000 mg | ORAL_CAPSULE | Freq: Every day | ORAL | 3 refills | Status: DC

## 2023-05-08 MED ORDER — ACCU-CHEK SOFTCLIX LANCETS MISC: 12 refills | Status: AC

## 2023-05-08 NOTE — Patient Instructions (Addendum)
You are eligible to schedule your annual wellness visit with our nurse specialist Inetta Fermo.  Please consider scheduling this before you leave today  Please stop by lab before you go If you have mychart- we will send your results within 3 business days of [name] receiving them.  If you do not have mychart- we will call you about results within 5 business days of [name] receiving them.  *please also note that you will see labs on mychart as soon as they post. I will later go in and write notes on them- will say "notes from Dr. Durene Cal"   Recommended follow up: Return in about 6 months (around 11/05/2023) for followup or sooner if needed.Schedule b4 you leave. Unless sugar high or doesn't trne ddown

## 2023-05-08 NOTE — Progress Notes (Signed)
Phone [phone]   Subjective:  Patient presents today for their annual physical. Chief complaint-noted.   See problem oriented charting- ROS- full  review of systems was completed and negative except for: nocturia, mild shoulder pain with exercise, canker sore in mouth  The following were reviewed and entered/updated in epic: Past Medical History:  Diagnosis Date   Allergy    Anxiety    Arthritis    back & knee   Asthma     mild per pt shows up with resp illness   Colon polyps    Constipation    Diabetes mellitus without complication (HCC)    Gallstones    Gastric polyps    Gastroparesis    GERD (gastroesophageal reflux disease)    Headache    sinus headaches and migraines at times   Heart murmur    History of migraine headaches    HTN (hypertension)    Hyperlipidemia    IBS (irritable bowel syndrome)    Joint pain    Lumbar disc disease    PONV (postoperative nausea and vomiting)    S/P aortic valve replacement with bioprosthetic valve 08/23/2016   25 mm Edwards Intuity rapid-deployment bovine pericardial tissue valve via partial upper mini sternotomy   Sleep apnea    does not use CPAP   TIA (transient ischemic attack)    TIA (transient ischemic attack)    hx of per pt   Patient Active Problem List   Diagnosis Date Noted   History of CVA in adulthood 08/13/2017    Priority: High   S/P minimally invasive aortic valve replacement with bioprosthetic valve 08/23/2016    Priority: High   Diastolic dysfunction     Priority: High   Diabetes mellitus (HCC) 05/12/2014    Priority: High   Vitamin D deficiency 06/04/2018    Priority: Medium    Hyponatremia 02/17/2018    Priority: Medium    Primary hypertension 01/02/2017    Priority: Medium    Osteopenia 05/27/2016    Priority: Medium    OSA (obstructive sleep apnea) 02/27/2016    Priority: Medium    Elevated alkaline phosphatase level 05/16/2015    Priority: Medium    GERD (gastroesophageal reflux disease)  03/10/2014    Priority: Medium    Generalized anxiety disorder 03/10/2014    Priority: Medium    Migraines 03/10/2014    Priority: Medium    Irritable bowel syndrome 12/15/2008    Priority: Medium    Hyperlipidemia associated with type 2 diabetes mellitus (HCC) 02/09/2008    Priority: Medium    Blurry vision 08/13/2017    Priority: Low   Bruxism 10/23/2015    Priority: Low   Atypical chest pain 11/10/2014    Priority: Low   Acne 05/12/2014    Priority: Low   EUSTACHIAN TUBE DYSFUNCTION, CHRONIC 06/14/2010    Priority: Low   Low back pain 08/11/2008    Priority: Low   Allergic rhinitis 02/12/2007    Priority: Low   Asthma 02/12/2007    Priority: Low   S/P reverse total shoulder arthroplasty, left 02/13/2021    Priority: 1.   Class 2 severe obesity with serious comorbidity and body mass index (BMI) of 36.0 to 36.9 in adult [facility]) 05/09/2022   Type 2 diabetes mellitus with obesity (HCC) 05/09/2022   Hypertension associated with type 2 diabetes mellitus (HCC) 05/09/2022   SOB (shortness of breath) 12/10/2021   Idiopathic hyperphosphatasia 08/03/2019   Past Surgical History:  Procedure Laterality Date  ABDOMINAL HYSTERECTOMY     AORTIC VALVE REPLACEMENT N/A 08/23/2016   Procedure: AORTIC VALVE REPLACEMENT (AVR) - using partial Upper Sternotomy- 25mm Edwards Intuity Aortic Valve used;  Surgeon: Purcell Nails, MD;  Location: [facility] OR;  Service: Open Heart Surgery;  Laterality: N/A;   BLADDER SUSPENSION  2007   BREAST ENHANCEMENT SURGERY  1980   CARPAL TUNNEL RELEASE Bilateral    COLONOSCOPY     DORSAL COMPARTMENT RELEASE Right 09/15/2014   Procedure: RIGHT WRIST DEQUERVAINS RELEASE ;  Surgeon: Mckinley Jewel, MD;  Location: [facility];  Service: Orthopedics;  Laterality: Right;   DRUG INDUCED ENDOSCOPY N/A 02/19/2022   Procedure: DRUG INDUCED SLEEP ENDOSCOPY;  Surgeon: Osborn Coho, MD;  Location: [facility];  Service: ENT;  Laterality: N/A;    ESOPHAGOGASTRODUODENOSCOPY     EYE SURGERY     cataract surgery   IMPLANTATION OF HYPOGLOSSAL NERVE STIMULATOR Right 04/23/2022   Procedure: IMPLANTATION OF HYPOGLOSSAL NERVE STIMULATOR;  Surgeon: Osborn Coho, MD;  Location: [facility];  Service: ENT;  Laterality: Right;   KNEE ARTHROSCOPY  04/15/2012   Procedure: ARTHROSCOPY KNEE;  Surgeon: Loreta Ave, MD;  Location: [facility] OR;  Service: Orthopedics;  Laterality: Right;   LAPAROSCOPIC CHOLECYSTECTOMY     LASIK     OOPHORECTOMY     PALATE TO GINGIVA GRAFT  2017   REVERSE SHOULDER ARTHROPLASTY Left 02/13/2021   Procedure: REVERSE SHOULDER ARTHROPLASTY;  Surgeon: Teryl Lucy, MD;  Location: [facility];  Service: Orthopedics;  Laterality: Left;   RIGHT/LEFT HEART CATH AND CORONARY ANGIOGRAPHY N/A 08/02/2016   Procedure: Right/Left Heart Cath and Coronary Angiography;  Surgeon: Tonny Bollman, MD;  Location: [facility] INVASIVE CV LAB;  Service: Cardiovascular;  Laterality: N/A;   ROTATOR CUFF REPAIR Left    SHOULDER ARTHROSCOPY WITH ROTATOR CUFF REPAIR AND SUBACROMIAL DECOMPRESSION Right 12/24/2022   Procedure: SHOULDER ARTHROSCOPY WITH DEBRIDEMENT, ROTATOR CUFF REPAIR AND SUBACROMIAL DECOMPRESSION;  Surgeon: Teryl Lucy, MD;  Location: [facility];  Service: Orthopedics;  Laterality: Right;   TEE WITHOUT CARDIOVERSION N/A 08/23/2016   Procedure: TRANSESOPHAGEAL ECHOCARDIOGRAM (TEE);  Surgeon: Purcell Nails, MD;  Location: [facility] OR;  Service: Open Heart Surgery;  Laterality: N/A;   TOTAL KNEE ARTHROPLASTY  04/15/2012   Procedure: TOTAL KNEE ARTHROPLASTY;  Surgeon: Loreta Ave, MD;  Location: [facility] OR;  Service: Orthopedics;  Laterality: Right;   TRIGGER FINGER RELEASE Right 04/20/2015   Procedure: RIGHT TRIGGER FINGER RELEASE (TENDON SHEALTH INCISION) ;  Surgeon: Loreta Ave, MD;  Location: [facility];  Service: Orthopedics;  Laterality: Right;    Family History  Problem Relation Age of Onset   COPD  Mother    Colon polyps Mother    Irritable bowel syndrome Mother    Anxiety disorder Mother    Heart disease Father    Alcohol abuse Father    Breast cancer Maternal Grandmother    Gallbladder disease Maternal Grandmother    Colon polyps Maternal Aunt    Diabetes Paternal Uncle        several uncles   Other Neg Hx        hyponatremia   Colon cancer Neg Hx    Esophageal cancer Neg Hx    Rectal cancer Neg Hx    Stomach cancer Neg Hx     Medications- reviewed and updated Current Outpatient Medications  Medication Sig Dispense Refill   amLODipine (NORVASC) 10 MG tablet Take 1 tablet (10 mg total) by mouth  daily. 90 tablet 1   atorvastatin (LIPITOR) 40 MG tablet TAKE 1 TABLET BY MOUTH DAILY AT 6 PM. 90 tablet 3   Biotin 5 MG TBDP SMARTSIG:1 By Mouth     blood glucose meter kit and supplies KIT Dispense based on patient and insurance preference. Use up to four times daily as directed. Dx: E11.9 1 each 0   calcium-vitamin D (OSCAL WITH D) 500-200 MG-UNIT per tablet Take 2 tablets by mouth daily.      cloNIDine (CATAPRES) 0.1 MG tablet Take 1 tablet (0.1 mg total) by mouth at bedtime. 90 tablet 1   clopidogrel (PLAVIX) 75 MG tablet TAKE 1 TABLET BY MOUTH EVERY DAY 90 tablet 3   D-5000 125 MCG (5000 UT) TABS SMARTSIG:1 Tablet(s) By Mouth     demeclocycline (DECLOMYCIN) 300 MG tablet Take 1 tablet (300 mg total) by mouth 2 (two) times daily. 180 tablet 3   fluticasone (FLONASE) 50 MCG/ACT nasal spray Place 2 sprays into both nostrils daily.     glucose blood (ACCU-CHEK GUIDE) test strip Use to test blood sugars daily. Dx: E11.9 100 strip 12   ipratropium (ATROVENT) 0.03 % nasal spray 2 SPRAYS IN EACH NOSTRIL AS NEEDED THREE TIMES AS NEEDED     irbesartan (AVAPRO) 300 MG tablet Take 1 tablet (300 mg total) by mouth daily. 90 tablet 3   loratadine (CLARITIN) 10 MG tablet Take 10 mg by mouth at bedtime.      Melatonin 5 MG TABS Take 5 mg by mouth at bedtime as needed (sleep).     metFORMIN  (GLUCOPHAGE) 500 MG tablet TAKE 1 TABLET BY MOUTH WITH BREAKFAST AND 1/2 TO 1 TABLET WITH DINNER 180 tablet 1   metoCLOPramide (REGLAN) 10 MG tablet TAKE 1/2 TABLET BY MOUTH 4 TIMES A DAY 60 tablet 3   metroNIDAZOLE (METROCREAM) 0.75 % cream      Multiple Vitamins-Minerals (MULTIVITAMIN ADULTS) TABS SMARTSIG:1 By Mouth     omeprazole (PRILOSEC) 40 MG capsule Take 1 capsule by mouth daily.     Probiotic Product (ALIGN) 4 MG CAPS Take 4 mg by mouth daily.      psyllium (METAMUCIL SMOOTH TEXTURE) 28 % packet Take 1 packet by mouth 2 (two) times daily.     sennosides-docusate sodium (SENOKOT-S) 8.6-50 MG tablet Take 2 tablets by mouth daily. 30 tablet 1   sodium chloride 1 g tablet Take 3 g by mouth 2 (two) times daily with a meal.     Accu-Chek Softclix Lancets lancets Use as instructed 100 each 12   venlafaxine XR (EFFEXOR-XR) 75 MG 24 hr capsule Take 1 capsule (75 mg total) by mouth daily with breakfast. 90 capsule 3   No current facility-administered medications for this visit.    Allergies-reviewed and updated Allergies  Allergen Reactions   Augmentin [Amoxicillin-Pot Clavulanate] Rash   Erythromycin Nausea Only   Penicillins Itching and Rash    Has patient had a PCN reaction causing immediate rash, facial/tongue/throat swelling, SOB or lightheadedness with hypotension: No Has patient had a PCN reaction causing severe rash involving mucus membranes or skin necrosis: No Has patient had a PCN reaction that required hospitalization: No Has patient had a PCN reaction occurring within the last 10 years: No  If all of the above answers are "NO", then may proceed with Cephalosporin use. Tolerated Ancef 02/13/21    Social History   Social History Narrative   She lives with husband (1978) and two dogs. Step-son Camillia Herter (1971-psychiatrist in [place]). No grandkids.  2 dogs-sheltie/collie mix and border collie mix      Highest level of education:  Master in education   She is retired  Clinical biochemist x 32 years.      Hobbies: time with dogs   Right handed   One story home         Objective  Objective:  BP 110/74   Pulse 77   Temp (!) 97 F (36.1 C)   Ht 5\' 6"  (1.676 m)   Wt 202 lb 12.8 oz (92 kg)   LMP  (LMP Unknown)   SpO2 99%   BMI 32.73 kg/m  Gen: NAD, resting comfortably HEENT: Mucous membranes are moist. Oropharynx normal Neck: no thyromegaly CV: RRR no murmurs rubs or gallops Lungs: CTAB no crackles, wheeze, rhonchi Abdomen: soft/nontender/nondistended/normal bowel sounds. No rebound or guarding.  Ext: no edema Skin: warm, dry Neuro: grossly normal, moves all extremities, PERRLA   Assessment and Plan   72 y.o. female presenting for annual physical.  Health Maintenance counseling: 1. Anticipatory guidance: Patient counseled regarding regular dental exams -q6 months- did recently break a tooth so plans follow up , eye exams - yearly,  avoiding smoking and second hand smoke , limiting alcohol to 1 beverage per day- doesn't drink , no illicit drugs .   2. Risk factor reduction:  Advised patient of need for regular exercise and diet rich and fruits and vegetables to reduce risk of heart attack and stroke.  Exercise- back issues last year had her down to 0x per week, currently finishing physical therapy and planning to try to get back to normal exercise .  Diet/weight management-weight up 7 lbs from last year- still working with healthy weight to wellness- feels situational with surger In July//physical therapy issues.  Wt Readings from Last 3 Encounters:  05/08/23 202 lb 12.8 oz (92 kg)  05/02/23 207 lb 12.8 oz (94.3 kg)  04/29/23 201 lb (91.2 kg)  3. Immunizations/screenings/ancillary studies- Prevnar 2027 possibly. Had flu shot. Consider COVID. Consider RSV- both at pharmacy Immunization History  Administered Date(s) Administered   Fluad Quad(high Dose 65+) 03/10/2022   Influenza Split 04/04/2011, 03/05/2012   Influenza Whole 04/16/2007,  03/16/2009, 03/13/2010   Influenza, High Dose Seasonal PF 01/31/2019, 02/27/2020, 02/28/2020, 03/27/2021, 03/10/2022, 04/16/2023   Influenza,inj,Quad PF,6+ Mos 03/30/2013, 03/10/2014, 03/23/2015, 02/27/2016   Influenza-Unspecified 02/27/2017, 02/28/2017, 01/31/2018   PFIZER Comirnaty(Gray Top)Covid-19 Tri-Sucrose Vaccine 11/17/2020   PFIZER(Purple Top)SARS-COV-2 Vaccination 08/15/2019, 09/08/2019, 03/31/2020   Pneumococcal Conjugate-13 05/21/2016   Pneumococcal Polysaccharide-23 05/23/2017, 03/27/2021   Td 06/24/1996, 08/06/2007, 11/07/2017   Tdap 11/07/2017   Zoster Recombinant(Shingrix) 01/21/2019, 03/24/2019   Zoster, Live 05/12/2014   4. Cervical cancer screening- follows by Dr. Dareen Piano gyn Steva Ready this visit)- no abnormal pap history. Past age based screening requirements 5. Breast cancer screening-  breast exam  With GYN and mammogram 09/27/21- plans to schedule but couldn't raise arm for this earlier in the year 6. Colon cancer screening - due 2026 with last 2016 with Dr. Marina Goodell  7. Skin cancer screening- Dr. Doreen Beam yearly. advised regular sunscreen use. 8. Birth control/STD check-postmenopausal/monogamous 9. Osteoporosis screening at 73-  see below -Never smoker   Status of chronic or acute concerns   #Nocturia- patient reports starting to pee at night for last 2 months between 3-4x a night (no daytime issues) and not drinking after 6 pm - will check urinalysis and culture- if we find no clear answer may refer to urology   #Shoulder pain- graduated from physical therapy just  recently- still doing exercises at home  #History of stroke-remains on Plavix long-term  at 75 mg  %s/p aortic valve replacement with bioprosthetic valve- requires dental prophylaxis. Follows with Dr. Shari Prows. Catheterization 08/02/16 with minor nonobstructive CAD.   % OSA- Prior CPAP- sees pulmonary  -now status post hypoglossal nerve stimulator inspire implant 04/23/22 doing well  #SIADH- has  seen Dr. Everardo All, now Dr. Lonzo Cloud- on demeclocycline and also on sodium and fluid restrictions- last sodium actually normal!  - also alk phos followed there. Negative bone scan 2021 for pagets and recent vitamin D ok.   # Diabetes S: compliant with metformin 500mg  BID in past- now down to 500 AM only CBGs- morning sugars up to around 150 had been 130 months ago Exercise and diet- trying to restart exercise Lab Results  Component Value Date   HGBA1C 6.3 10/03/2022   HGBA1C 6.3 (H) 03/05/2022   HGBA1C 6.3 09/24/2021   A/P: diabetes hopefully stable- update a1c today. Continue current meds for now - may need to add evening metformin back  #hypertension/diastolic dysfunction (prior listed as CHF but no chronic lasix need and no hospitalization for HF) S:medication: amlodipine 10mg , irbesartan 300mg ,  now on clonidine 0.1mg  nightly to see if helps with sweating issues- some help - Prior on metoprolol 12.5mg  BID and lasix A/P: stable- continue current medicines   #hyperlipidemia/history of stroke S: for lipids compliant with  atorvastatin 40mg .   Patient also compliant with stroke history- plavix  Lab Results  Component Value Date   CHOL 147 10/03/2022   HDL 72.70 10/03/2022   LDLCALC 61 10/03/2022   LDLDIRECT 67.0 04/03/2022   TRIG 68.0 10/03/2022   CHOLHDL 2 10/03/2022   A/P: hopefully stable- update lipid panel today. Continue current meds for now   # generalized anxiety S: patient is compliant with venlafaxine 75mg .  We are using lower dose as had more hyponatremia in the past- possible SIADH.  A/P: doing well- continue current medications    # Migraines - released  with Dr. Everlena Cooper S:patient with history of stroke- sumatriptan was stopped due to stroke history. Venlafaxine mentioned for migraine prophylaxis. PRN treatment with ibuprofen or execrin with reglan per Dr. Everlena Cooper- thought lower risk than sumatriptan  A/P: no recent issues- continue current medications    #  Asthma/allergic rhinitis- Dr. Irena Cords S: doing well  recently with prn albuterol only. Compliant with claritin, astenlin, nasonex, saline. 2020 stopped allergy shots A/P: doing well- continue current medications - no recent allergy shots   # Low back pain/degenerative disc disease- prior Dr. Maurice Small S: intermittent issues- has been better with weight loss. Should avoid nsaids with plavix   A/P: some aleve- not ideal but will update renal function today- back has been reasonable lately. May need to transition to tramadol    # GERD S:Dr. Marina Goodell has advised higher dose omeprazole at 40mg  per patient so she wants to continue this dose- compliant   A/P: stable- continue current medicines - occasional mild breakthrough   # Low Bone density (formerly osteopenia) S: Last DEXA:  04/12/22 with osteopenia but in range where could be treated  Calcium: 1200mg  (through diet ok) recommended - taking Vitamin D: 1000 units a day recommended- taking  A/P: needs to add weight bearing exercise back in for bone density. Continue calciium/vitamin D. Could start fosamax but concern with reflux and upcoming dental work  - plan is to repeat 03/2024 and reevaluate- work on calcium/vitamin D/weight bearing exercise  Recommended follow up: No follow-ups  on file. Future Appointments  Date Time Provider Department Center  05/29/2023 12:00 PM William Hamburger D, NP [facility] None  07/01/2023 11:00 AM Oretha Milch, MD [facility]  08/27/2023  3:00 PM [id number]  05/03/2024  3:00 PM Carlus Pavlov, MD [id number] None   Lab/Order associations: fasting   ICD-10-CM   1. Preventative health care  Z00.00     2. Type 2 diabetes mellitus without complication, unspecified whether long term insulin use (HCC)  E11.9 Hemoglobin A1c    Urine Microalbumin w/creat. ratio    CBC with Differential/Platelet    Lipid panel    3. Hyperlipidemia associated with type 2 diabetes mellitus (HCC)  E11.69     E78.5     4. Primary hypertension  I10     5. Vitamin D deficiency  E55.9     6. Long term current use of oral hypoglycemic drug  Z79.84     7. Nocturia  R35.1 Urine Culture    Urinalysis, Routine w reflex microscopic      Meds ordered this encounter  Medications   Accu-Chek Softclix Lancets lancets    Sig: Use as instructed    Dispense:  100 each    Refill:  12   venlafaxine XR (EFFEXOR-XR) 75 MG 24 hr capsule    Sig: Take 1 capsule (75 mg total) by mouth daily with breakfast.    Dispense:  90 capsule    Refill:  3    Return precautions advised.  Tana Conch, MD

## 2023-05-09 LAB — URINALYSIS, ROUTINE W REFLEX MICROSCOPIC
Bilirubin Urine: NEGATIVE
Hgb urine dipstick: NEGATIVE
Ketones, ur: NEGATIVE
Nitrite: NEGATIVE
RBC / HPF: NONE SEEN (ref 0–?)
Specific Gravity, Urine: 1.01 (ref 1.000–1.030)
Total Protein, Urine: NEGATIVE
Urine Glucose: NEGATIVE
Urobilinogen, UA: 0.2 (ref 0.0–1.0)
pH: 6 (ref 5.0–8.0)

## 2023-05-09 LAB — URINE CULTURE
MICRO NUMBER:: 15731323
Result:: NO GROWTH
SPECIMEN QUALITY:: ADEQUATE

## 2023-05-09 NOTE — Addendum Note (Signed)
Addended by: Lorn Junes on: 05/09/2023 05:03 PM   Modules accepted: Orders

## 2023-05-12 ENCOUNTER — Encounter: Payer: Self-pay | Admitting: Family Medicine

## 2023-05-12 ENCOUNTER — Other Ambulatory Visit: Payer: Self-pay

## 2023-05-12 DIAGNOSIS — R351 Nocturia: Secondary | ICD-10-CM

## 2023-05-19 ENCOUNTER — Encounter: Payer: Self-pay | Admitting: Family Medicine

## 2023-05-21 ENCOUNTER — Other Ambulatory Visit: Payer: Self-pay | Admitting: Family Medicine

## 2023-05-21 DIAGNOSIS — M75101 Unspecified rotator cuff tear or rupture of right shoulder, not specified as traumatic: Secondary | ICD-10-CM | POA: Diagnosis not present

## 2023-05-21 MED ORDER — TRAMADOL HCL 50 MG PO TABS: 50.0000 mg | ORAL_TABLET | Freq: Four times a day (QID) | ORAL | 0 refills | Status: DC | PRN

## 2023-05-28 ENCOUNTER — Other Ambulatory Visit: Payer: Self-pay | Admitting: Family Medicine

## 2023-05-29 ENCOUNTER — Encounter (INDEPENDENT_AMBULATORY_CARE_PROVIDER_SITE_OTHER): Payer: Self-pay | Admitting: Adult Health

## 2023-05-29 ENCOUNTER — Ambulatory Visit (INDEPENDENT_AMBULATORY_CARE_PROVIDER_SITE_OTHER): Payer: Medicare PPO | Admitting: Adult Health

## 2023-05-29 VITALS — BP 133/65 | HR 89 | Temp 99.2°F | Ht 66.0 in | Wt 201.0 lb

## 2023-05-29 DIAGNOSIS — E1169 Type 2 diabetes mellitus with other specified complication: Secondary | ICD-10-CM

## 2023-05-29 DIAGNOSIS — M7062 Trochanteric bursitis, left hip: Secondary | ICD-10-CM | POA: Diagnosis not present

## 2023-05-29 DIAGNOSIS — E669 Obesity, unspecified: Secondary | ICD-10-CM | POA: Diagnosis not present

## 2023-05-29 DIAGNOSIS — Z6832 Body mass index (BMI) 32.0-32.9, adult: Secondary | ICD-10-CM

## 2023-05-29 DIAGNOSIS — E785 Hyperlipidemia, unspecified: Secondary | ICD-10-CM | POA: Diagnosis not present

## 2023-05-29 DIAGNOSIS — E119 Type 2 diabetes mellitus without complications: Secondary | ICD-10-CM

## 2023-05-29 DIAGNOSIS — E871 Hypo-osmolality and hyponatremia: Secondary | ICD-10-CM

## 2023-05-29 DIAGNOSIS — Z7984 Long term (current) use of oral hypoglycemic drugs: Secondary | ICD-10-CM

## 2023-05-29 NOTE — Progress Notes (Signed)
WEIGHT SUMMARY AND BIOMETRICS  Vitals Temp: 99.2 F (37.3 C) BP: 133/65 Pulse Rate: 89 SpO2: 97 %   Anthropometric Measurements Height: 5\' 6"  (1.676 m) Weight: 201 lb (91.2 kg) BMI (Calculated): 32.46 Weight at Last Visit: 201lb Weight Lost Since Last Visit: 0 Weight Gained Since Last Visit: 0 Starting Weight: 220lb Total Weight Loss (lbs): 19 lb (8.618 kg)   Body Composition  Body Fat %: 46.1 % Fat Mass (lbs): 92.6 lbs Muscle Mass (lbs): 103 lbs Total Body Water (lbs): 73.4 lbs Visceral Fat Rating : 14   Other Clinical Data Fasting: no Labs: no Today's Visit #: 39 Starting Date: 01/19/18    Chief Complaint:   OBESITY [name] is here to discuss her progress with her obesity treatment plan. She is on the the Category 2 Plan and states she is following her eating plan approximately 70 % of the time.  She states she is exercising NEAT and home shoulder exercises.   Interim History:  05/08/2023 OV with labs with PCP/Dr. Durene Cal She recently increased Metformin 500mg  from 1 tab am and 1/2 tab pm to 500mg  BID  Exercise-NEAT activities and home PT exercises  Hydration-she estimates to drink 40-45 oz water/day  Interval Goal: Maintain weight over Dec into the New Year  Subjective:   1. Hyponatremia  Latest Reference Range & Units 10/03/22 13:45 12/19/22 13:00 05/02/23 15:27  Sodium 135 - 146 mmol/L 132 (L) 129 (L) 137  (L): Data is abnormally low  Level normalized! She denies confusion or HAs Hydration-she estimates to drink 40-45 oz water/day  2. Hyperlipidemia associated with type 2 diabetes mellitus (HCC) Lipid Panel     Component Value Date/Time   CHOL 152 05/08/2023 1149   CHOL 144 06/05/2021 1230   TRIG 92.0 05/08/2023 1149   HDL 60.60 05/08/2023 1149   HDL 64 06/05/2021 1230   CHOLHDL 3 05/08/2023 1149   VLDL 18.4 05/08/2023 1149   LDLCALC 73 05/08/2023 1149   LDLCALC 65 06/05/2021 1230   LDLDIRECT 67.0 04/03/2022 1000   LABVLDL  15 06/05/2021 1230  The ASCVD Risk score (Arnett DK, et al., 2019) failed to calculate for the following reasons:   The patient has a prior MI or stroke diagnosis   She is currently on: irbesartan (AVAPRO) 300 MG tablet  amLODipine (NORVASC) 10 MG tablet  cloNIDine (CATAPRES) 0.1 MG tablet  metFORMIN (GLUCOPHAGE) 500 MG tablet  atorvastatin (LIPITOR) 40 MG tablet   3. Type 2 diabetes mellitus without complication, unspecified whether long term insulin use (HCC) Lab Results  Component Value Date   HGBA1C 7.0 (H) 05/08/2023   HGBA1C 6.3 10/03/2022   HGBA1C 6.3 (H) 03/05/2022    She is not checking home CBG She denies sx;s of hypoglycemia 05/08/2023 OV with labs with PCP/Dr. Durene Cal She recently increased Metformin 500mg  from 1 tab am and 1/2 tab pm to 500mg  BID  Assessment/Plan:   1. Hyponatremia Monitor for sx's of hyponatremia   2. Hyperlipidemia associated with type 2 diabetes mellitus (HCC) Continue irbesartan (AVAPRO) 300 MG tablet  amLODipine (NORVASC) 10 MG tablet  cloNIDine (CATAPRES) 0.1 MG tablet  metFORMIN (GLUCOPHAGE) 500 MG tablet  atorvastatin (LIPITOR) 40 MG tablet   3. Type 2 diabetes mellitus without complication, unspecified whether long term insulin use (HCC) Continue Metformin 500mg  BID with meals, per PCP  4. Obesity, current BMI 32.5  [name] is currently in the action stage of change. As such, her goal is to continue with weight loss efforts. She  has agreed to the Category 2 Plan.   Exercise goals: Older adults should follow the adult guidelines. When older adults cannot meet the adult guidelines, they should be as physically active as their abilities and conditions will allow.  Older adults should do exercises that maintain or improve balance if they are at risk of falling.  Older adults should determine their level of effort for physical activity relative to their level of fitness.  Older adults with chronic conditions should understand whether and  how their conditions affect their ability to do regular physical activity safely.  Behavioral modification strategies: increasing lean protein intake, decreasing simple carbohydrates, increasing vegetables, increasing water intake, no skipping meals, meal planning and cooking strategies, keeping healthy foods in the home, holiday eating strategies , celebration eating strategies, and planning for success.  [name] has agreed to follow-up with our clinic in 4 weeks. She was informed of the importance of frequent follow-up visits to maximize her success with intensive lifestyle modifications for her multiple health conditions.  Objective:   Blood pressure 133/65, pulse 89, temperature 99.2 F (37.3 C), height 5\' 6"  (1.676 m), weight 201 lb (91.2 kg), SpO2 97%. Body mass index is 32.44 kg/m.  General: Cooperative, alert, well developed, in no acute distress. HEENT: Conjunctivae and lids unremarkable. Cardiovascular: Regular rhythm.  Lungs: Normal work of breathing. Neurologic: No focal deficits.   Lab Results  Component Value Date   CREATININE 0.68 05/02/2023   BUN 18 05/02/2023   NA 137 05/02/2023   K 4.9 05/02/2023   CL 101 05/02/2023   CO2 26 05/02/2023   Lab Results  Component Value Date   ALT 14 05/02/2023   AST 25 05/02/2023   ALKPHOS 119 (H) 10/03/2022   BILITOT 0.4 05/02/2023   Lab Results  Component Value Date   HGBA1C 7.0 (H) 05/08/2023   HGBA1C 6.3 10/03/2022   HGBA1C 6.3 (H) 03/05/2022   HGBA1C 6.3 09/24/2021   HGBA1C 6.6 (H) 06/05/2021   Lab Results  Component Value Date   INSULIN 6.3 03/05/2022   INSULIN 12.9 06/05/2021   INSULIN 7.7 01/25/2021   INSULIN 13.1 04/20/2020   INSULIN 12.1 01/19/2018   Lab Results  Component Value Date   TSH 4.23 09/17/2021   Lab Results  Component Value Date   CHOL 152 05/08/2023   HDL 60.60 05/08/2023   LDLCALC 73 05/08/2023   LDLDIRECT 67.0 04/03/2022   TRIG 92.0 05/08/2023   CHOLHDL 3 05/08/2023   Lab  Results  Component Value Date   VD25OH 49 05/02/2023   VD25OH 62.2 03/05/2022   VD25OH 70.1 06/05/2021   Lab Results  Component Value Date   WBC 9.2 05/08/2023   HGB 11.3 (L) 05/08/2023   HCT 35.5 (L) 05/08/2023   MCV 78.2 05/08/2023   PLT 381.0 05/08/2023   Lab Results  Component Value Date   IRON 48 06/12/2022   TIBC 450.8 (H) 06/12/2022   FERRITIN 12.0 06/12/2022    Attestation Statements:   Reviewed by clinician on day of visit: allergies, medications, problem list, medical history, surgical history, family history, social history, and previous encounter notes.  Time spent on visit including pre-visit chart review and post-visit care and charting was 25 minutes.   I have reviewed the above documentation for accuracy and completeness, and I agree with the above. -  Cathye Kreiter d. Jorje Vanatta, NP-C

## 2023-05-30 ENCOUNTER — Other Ambulatory Visit: Payer: Self-pay | Admitting: Family Medicine

## 2023-05-30 ENCOUNTER — Encounter: Payer: Self-pay | Admitting: Family Medicine

## 2023-06-11 ENCOUNTER — Other Ambulatory Visit: Payer: Self-pay | Admitting: Family Medicine

## 2023-06-11 DIAGNOSIS — K219 Gastro-esophageal reflux disease without esophagitis: Secondary | ICD-10-CM | POA: Diagnosis not present

## 2023-06-11 DIAGNOSIS — R519 Headache, unspecified: Secondary | ICD-10-CM | POA: Diagnosis not present

## 2023-06-11 DIAGNOSIS — J3089 Other allergic rhinitis: Secondary | ICD-10-CM | POA: Diagnosis not present

## 2023-06-11 DIAGNOSIS — J300 Vasomotor rhinitis: Secondary | ICD-10-CM | POA: Diagnosis not present

## 2023-06-11 MED ORDER — CLINDAMYCIN HCL 300 MG PO CAPS: ORAL_CAPSULE | ORAL | 0 refills | Status: DC

## 2023-06-30 ENCOUNTER — Other Ambulatory Visit: Payer: Self-pay | Admitting: Family Medicine

## 2023-06-30 ENCOUNTER — Encounter (HOSPITAL_BASED_OUTPATIENT_CLINIC_OR_DEPARTMENT_OTHER): Payer: Self-pay

## 2023-07-01 ENCOUNTER — Encounter (HOSPITAL_BASED_OUTPATIENT_CLINIC_OR_DEPARTMENT_OTHER): Payer: Self-pay | Admitting: Pulmonary Disease

## 2023-07-01 ENCOUNTER — Ambulatory Visit (HOSPITAL_BASED_OUTPATIENT_CLINIC_OR_DEPARTMENT_OTHER): Payer: Medicare PPO | Admitting: Pulmonary Disease

## 2023-07-01 VITALS — BP 120/62 | HR 70 | Resp 16 | Ht 66.0 in | Wt 204.8 lb

## 2023-07-01 DIAGNOSIS — G4733 Obstructive sleep apnea (adult) (pediatric): Secondary | ICD-10-CM

## 2023-07-01 NOTE — Progress Notes (Signed)
   Subjective:    Patient ID: [name], female    DOB: 09-08-1950, 73 y.o.   MRN: [id number]  HPI  73  yo for follow-up of OSA   PMH -  hypertension, diabetes, stroke, diastolic dysfunction and aortic valve replacement, SIADH on demeclocycline   She was unable to tolerate CPAP and underwent implantation of hypoglossal nerve stimulator by Dr. Mable on 04/23/22.  07/02/22 Device activated >> Start delay 30 minutes, pause time 15 minutes, duration 12 hours  09/2022 we felt she was overstimulated based on sleep study results and decreased voltage to 1.4 V.  Home sleep test at this level showed high residual AHI.  11/2022 We switched her  to electrode B configuration. sensation 0.5 V , functional level 0.7 V lower limit, upper limit 1.2 V.  She was set at level 2 =0.8 V. there was good tongue protrusion at this level.  We also checked electrode C configuration but there seem to be some retraction rather than protrusion  33-month follow-up visit. For some reason she never followed up.  She increased 1 level to 0.9 V and has been feeling well at this level.  Husband accompanies today and denies any snoring or witnessed apneas.  She feels well rested and denies daytime somnolence or sleep pressure in the afternoons. She was unable to use her app and we were able to help her troubleshoot this, compliance was reviewed which shows good usage almost close to 12 hours every night without a single missed night    Significant tests/ events reviewed   HST 10/18/22 on Inspire 1.4 V >> AHI 33/h, low sat 81%  Inspire titration 08/2022 She was at 1.8V at entry. Voltage was titrated from 1.6V to 2.6V. Hypopneas persisted at the final level of 2.6 V.  Low sat of 80%   Home sleep study July 2017 AHI 19/hr    HST 09/2021 showed moderate sleep apnea with AHI at 16.5/hour and SPO2 low at 77%    Review of Systems neg for any significant sore throat, dysphagia, itching, sneezing, nasal congestion or  excess/ purulent secretions, fever, chills, sweats, unintended wt loss, pleuritic or exertional cp, hempoptysis, orthopnea pnd or change in chronic leg swelling. Also denies presyncope, palpitations, heartburn, abdominal pain, nausea, vomiting, diarrhea or change in bowel or urinary habits, dysuria,hematuria, rash, arthralgias, visual complaints, headache, numbness weakness or ataxia.     Objective:   Physical Exam  Gen. Pleasant, obese, in no distress ENT - no lesions, no post nasal drip Neck: No JVD, no thyromegaly, no carotid bruits Lungs: no use of accessory muscles, no dullness to percussion, decreased without rales or rhonchi  Cardiovascular: Rhythm regular, heart sounds  normal, no murmurs or gallops, no peripheral edema Musculoskeletal: No deformities, no cyanosis or clubbing , no tremors   Good tongue protrusion noted at current level of stimulation and on 1.0 V      Assessment & Plan:

## 2023-07-01 NOTE — Patient Instructions (Signed)
 Schedule watchpat study on current level

## 2023-07-01 NOTE — Assessment & Plan Note (Signed)
 Hypoglossal nerve stimulator was reassessed today.  Goal of the follow-up visit was to ensure good compliance, good subjective benefit, good tongue motion and good sense lead waveforms .  Download was reviewed and usage appears to be excellent spouse reports that snoring is decreased and she has more energy.  She denies any discomfort.  She is at level 3= 0.9 V with a range of 0.7 to 1.2 V  We will check home sleep study at this level using WatchPAT device.  For some reason of events are not well-controlled and we will have to switch to electrode C configuration

## 2023-07-03 ENCOUNTER — Encounter (INDEPENDENT_AMBULATORY_CARE_PROVIDER_SITE_OTHER): Payer: Self-pay | Admitting: Adult Health

## 2023-07-03 ENCOUNTER — Ambulatory Visit (INDEPENDENT_AMBULATORY_CARE_PROVIDER_SITE_OTHER): Payer: Medicare PPO | Admitting: Adult Health

## 2023-07-03 VITALS — BP 134/73 | HR 64 | Temp 99.5°F | Ht 66.0 in | Wt 201.0 lb

## 2023-07-03 DIAGNOSIS — Z7984 Long term (current) use of oral hypoglycemic drugs: Secondary | ICD-10-CM | POA: Diagnosis not present

## 2023-07-03 DIAGNOSIS — Z6832 Body mass index (BMI) 32.0-32.9, adult: Secondary | ICD-10-CM

## 2023-07-03 DIAGNOSIS — G4733 Obstructive sleep apnea (adult) (pediatric): Secondary | ICD-10-CM

## 2023-07-03 DIAGNOSIS — E119 Type 2 diabetes mellitus without complications: Secondary | ICD-10-CM | POA: Diagnosis not present

## 2023-07-03 DIAGNOSIS — E669 Obesity, unspecified: Secondary | ICD-10-CM | POA: Diagnosis not present

## 2023-07-03 DIAGNOSIS — R3915 Urgency of urination: Secondary | ICD-10-CM | POA: Diagnosis not present

## 2023-07-03 DIAGNOSIS — I10 Essential (primary) hypertension: Secondary | ICD-10-CM

## 2023-07-03 DIAGNOSIS — R351 Nocturia: Secondary | ICD-10-CM | POA: Diagnosis not present

## 2023-07-03 DIAGNOSIS — E66812 Obesity, class 2: Secondary | ICD-10-CM

## 2023-07-03 NOTE — Progress Notes (Signed)
 WEIGHT SUMMARY AND BIOMETRICS  Vitals Temp: 99.5 F (37.5 C) BP: 134/73 Pulse Rate: 64 SpO2: 97 %   Anthropometric Measurements Height: 5' 6 (1.676 m) Weight: 201 lb (91.2 kg) BMI (Calculated): 32.46 Weight at Last Visit: 201lb Weight Lost Since Last Visit: 0 Weight Gained Since Last Visit: 0 Starting Weight: 220lb Total Weight Loss (lbs): 19 lb (8.618 kg)   Body Composition  Body Fat %: 46.3 % Fat Mass (lbs): 93.4 lbs Muscle Mass (lbs): 103 lbs Total Body Water  (lbs): 71.8 lbs Visceral Fat Rating : 14   Other Clinical Data Fasting: no Labs: no Today's Visit #: 80 Starting Date: 01/19/18    Chief Complaint:   OBESITY [name] is here to discuss her progress with her obesity treatment plan. She is on the the Category 2 Plan and states she is following her eating plan approximately 60 % of the time.  She states she is exercising: None   Interim History:  [name] is very please to have achieved her health goal over the holidays- to maintain!  [name] is undergoing dental procedures, ie: cracked tooth  She has temporary bridge- will have permanent bridge placed in a few weeks.  She has been only able to masticate and consume soft foods, ie: soup, eggs, and greek yogurt  Subjective:   1. Type 2 diabetes mellitus without complication, unspecified whether long term insulin  use (HCC) Lab Results  Component Value Date   HGBA1C 7.0 (H) 05/08/2023   HGBA1C 6.3 10/03/2022   HGBA1C 6.3 (H) 03/05/2022    She is on irbesartan  (AVAPRO ) 300 MG tablet  metFORMIN  (GLUCOPHAGE ) 500 MG tablet  atorvastatin  (LIPITOR) 40 MG tablet   Loading dose Ozempic  0.25mg  caused profound constipation, unable to tolerate GLP-1 therapy  2. OSA (obstructive sleep apnea) Pulmology OV Notes 07/01/2023 66-month follow-up visit. For some reason she never followed up.  She increased 1 level to 0.9 V and has been feeling well at this level.  Husband accompanies today and  denies any snoring or witnessed apneas.  She feels well rested and denies daytime somnolence or sleep pressure in the afternoons. She was unable to use her app and we were able to help her troubleshoot this, compliance was reviewed which shows good usage almost close to 12 hours every night without a single missed night  Good tongue protrusion noted at current level of stimulation and on 1.0 V   Hypoglossal nerve stimulator was reassessed today.  Goal of the follow-up visit was to ensure good compliance, good subjective benefit, good tongue motion and good sense lead waveforms .  Download was reviewed and usage appears to be excellent spouse reports that snoring is decreased and she has more energy.  She denies any discomfort.  She is at level 3= 0.9 V with a range of 0.7 to 1.2 V   We will check home sleep study at this level using WatchPAT device.  For some reason of events are not well-controlled and we will have to switch to electrode C configuration     3. Primary hypertension BP stable at OV She reports back pain and oral pain today. She is on Na+ 1g BID- she takes with scoop of yogurt to mask taste of tablet Currently on Amlodipine  10 mg, irbesartan  300mg , clonidine  0.1 mg nightly now Prior losartan  and Metoprolol  Off lasix  2019  Assessment/Plan:   1. Type 2 diabetes mellitus without complication, unspecified whether long term insulin  use (HCC) Continue Cat 2 Meal Plan  and increase daily activity. Attend at least 1 water  aerobics class in the next month Continue Metformin  therapy per PCP  2. OSA (obstructive sleep apnea) Continue with weight loss efforts  3. Primary hypertension Increase daily activity Continue  irbesartan  (AVAPRO ) 300 MG tablet  amLODipine  (NORVASC ) 10 MG tablet  cloNIDine  (CATAPRES ) 0.1 MG tablet  metFORMIN  (GLUCOPHAGE ) 500 MG tablet  atorvastatin  (LIPITOR) 40 MG tablet   4. Obesity, current BMI 32.46 (Primary)  [name] is currently in the action  stage of change. As such, her goal is to continue with weight loss efforts. She has agreed to the Category 2 Plan.   Exercise goals: Older adults should follow the adult guidelines. When older adults cannot meet the adult guidelines, they should be as physically active as their abilities and conditions will allow.  Older adults should do exercises that maintain or improve balance if they are at risk of falling.  Older adults should determine their level of effort for physical activity relative to their level of fitness.  Older adults with chronic conditions should understand whether and how their conditions affect their ability to do regular physical activity safely.  Attend at least 1 water  aerobics class in the next month  Behavioral modification strategies: increasing lean protein intake, decreasing simple carbohydrates, increasing vegetables, increasing water  intake, no skipping meals, meal planning and cooking strategies, keeping healthy foods in the home, ways to avoid boredom eating, and planning for success.  [name] has agreed to follow-up with our clinic in 4 weeks. She was informed of the importance of frequent follow-up visits to maximize her success with intensive lifestyle modifications for her multiple health conditions.   Objective:   Blood pressure 134/73, pulse 64, temperature 99.5 F (37.5 C), height 5' 6 (1.676 m), weight 201 lb (91.2 kg), SpO2 97%. Body mass index is 32.44 kg/m.  General: Cooperative, alert, well developed, in no acute distress. HEENT: Conjunctivae and lids unremarkable. Cardiovascular: Regular rhythm.  Lungs: Normal work of breathing. Neurologic: No focal deficits.   Lab Results  Component Value Date   CREATININE 0.68 05/02/2023   BUN 18 05/02/2023   NA 137 05/02/2023   K 4.9 05/02/2023   CL 101 05/02/2023   CO2 26 05/02/2023   Lab Results  Component Value Date   ALT 14 05/02/2023   AST 25 05/02/2023   ALKPHOS 119 (H) 10/03/2022    BILITOT 0.4 05/02/2023   Lab Results  Component Value Date   HGBA1C 7.0 (H) 05/08/2023   HGBA1C 6.3 10/03/2022   HGBA1C 6.3 (H) 03/05/2022   HGBA1C 6.3 09/24/2021   HGBA1C 6.6 (H) 06/05/2021   Lab Results  Component Value Date   INSULIN  6.3 03/05/2022   INSULIN  12.9 06/05/2021   INSULIN  7.7 01/25/2021   INSULIN  13.1 04/20/2020   INSULIN  12.1 01/19/2018   Lab Results  Component Value Date   TSH 4.23 09/17/2021   Lab Results  Component Value Date   CHOL 152 05/08/2023   HDL 60.60 05/08/2023   LDLCALC 73 05/08/2023   LDLDIRECT 67.0 04/03/2022   TRIG 92.0 05/08/2023   CHOLHDL 3 05/08/2023   Lab Results  Component Value Date   VD25OH 49 05/02/2023   VD25OH 62.2 03/05/2022   VD25OH 70.1 06/05/2021   Lab Results  Component Value Date   WBC 9.2 05/08/2023   HGB 11.3 (L) 05/08/2023   HCT 35.5 (L) 05/08/2023   MCV 78.2 05/08/2023   PLT 381.0 05/08/2023   Lab Results  Component Value Date  IRON 48 06/12/2022   TIBC 450.8 (H) 06/12/2022   FERRITIN 12.0 06/12/2022    Attestation Statements:   Reviewed by clinician on day of visit: allergies, medications, problem list, medical history, surgical history, family history, social history, and previous encounter notes.  Time spent on visit including pre-visit chart review and post-visit care and charting was 28 minutes.   I have reviewed the above documentation for accuracy and completeness, and I agree with the above. -  Hayley Devincent d. Abrie Egloff, NP-C

## 2023-07-09 ENCOUNTER — Encounter: Payer: Self-pay | Admitting: Internal Medicine

## 2023-07-10 ENCOUNTER — Other Ambulatory Visit: Payer: Self-pay | Admitting: Internal Medicine

## 2023-07-10 DIAGNOSIS — E871 Hypo-osmolality and hyponatremia: Secondary | ICD-10-CM

## 2023-07-10 MED ORDER — DEMECLOCYCLINE HCL 300 MG PO TABS: 150.0000 mg | ORAL_TABLET | Freq: Two times a day (BID) | ORAL | Status: DC

## 2023-07-11 ENCOUNTER — Other Ambulatory Visit: Payer: Self-pay | Admitting: Internal Medicine

## 2023-07-11 DIAGNOSIS — E871 Hypo-osmolality and hyponatremia: Secondary | ICD-10-CM

## 2023-07-11 MED ORDER — DEMECLOCYCLINE HCL 150 MG PO TABS
150.0000 mg | ORAL_TABLET | Freq: Two times a day (BID) | ORAL | 3 refills | Status: AC
Start: 2023-07-11 — End: ?

## 2023-07-21 ENCOUNTER — Ambulatory Visit: Payer: Medicare PPO

## 2023-07-21 DIAGNOSIS — G4733 Obstructive sleep apnea (adult) (pediatric): Secondary | ICD-10-CM

## 2023-07-23 ENCOUNTER — Other Ambulatory Visit: Payer: Self-pay | Admitting: Family Medicine

## 2023-07-24 DIAGNOSIS — M545 Low back pain, unspecified: Secondary | ICD-10-CM | POA: Diagnosis not present

## 2023-07-30 ENCOUNTER — Other Ambulatory Visit: Payer: Self-pay | Admitting: Nurse Practitioner

## 2023-07-30 ENCOUNTER — Other Ambulatory Visit: Payer: Self-pay | Admitting: Family Medicine

## 2023-07-30 DIAGNOSIS — Z953 Presence of xenogenic heart valve: Secondary | ICD-10-CM

## 2023-07-30 DIAGNOSIS — I10 Essential (primary) hypertension: Secondary | ICD-10-CM

## 2023-07-30 DIAGNOSIS — Z79899 Other long term (current) drug therapy: Secondary | ICD-10-CM

## 2023-07-30 DIAGNOSIS — E1159 Type 2 diabetes mellitus with other circulatory complications: Secondary | ICD-10-CM

## 2023-07-30 DIAGNOSIS — M5416 Radiculopathy, lumbar region: Secondary | ICD-10-CM | POA: Diagnosis not present

## 2023-07-31 ENCOUNTER — Encounter (INDEPENDENT_AMBULATORY_CARE_PROVIDER_SITE_OTHER): Payer: Self-pay | Admitting: Adult Health

## 2023-07-31 ENCOUNTER — Ambulatory Visit (INDEPENDENT_AMBULATORY_CARE_PROVIDER_SITE_OTHER): Payer: Medicare PPO | Admitting: Adult Health

## 2023-07-31 VITALS — BP 140/68 | HR 58 | Temp 98.0°F | Ht 66.0 in | Wt 207.0 lb

## 2023-07-31 DIAGNOSIS — Z6833 Body mass index (BMI) 33.0-33.9, adult: Secondary | ICD-10-CM | POA: Diagnosis not present

## 2023-07-31 DIAGNOSIS — E669 Obesity, unspecified: Secondary | ICD-10-CM | POA: Diagnosis not present

## 2023-07-31 DIAGNOSIS — E119 Type 2 diabetes mellitus without complications: Secondary | ICD-10-CM

## 2023-07-31 DIAGNOSIS — E66812 Obesity, class 2: Secondary | ICD-10-CM

## 2023-07-31 DIAGNOSIS — M545 Low back pain, unspecified: Secondary | ICD-10-CM | POA: Diagnosis not present

## 2023-07-31 DIAGNOSIS — Z7984 Long term (current) use of oral hypoglycemic drugs: Secondary | ICD-10-CM | POA: Diagnosis not present

## 2023-07-31 NOTE — Progress Notes (Signed)
 WEIGHT SUMMARY AND BIOMETRICS  Vitals Temp: 98 F (36.7 C) BP: (!) 140/68 Pulse Rate: (!) 58 SpO2: 99 %   Anthropometric Measurements Height: 5' 6 (1.676 m) Weight: 207 lb (93.9 kg) BMI (Calculated): 33.43 Weight at Last Visit: 201 lb Weight Lost Since Last Visit: 0 lb Weight Gained Since Last Visit: 6 lb Starting Weight: 220 lb Total Weight Loss (lbs): 13 lb (5.897 kg)   Body Composition  Body Fat %: 48.1 % Fat Mass (lbs): 99.6 lbs Muscle Mass (lbs): 102.2 lbs Total Body Water  (lbs): 77.4 lbs Visceral Fat Rating : 14   Other Clinical Data Fasting: No Labs: No Today's Visit #: 68 Starting Date: 01/19/18    Chief Complaint:   OBESITY [name] is here to discuss her progress with her obesity treatment plan. She is on the the Category 2 Plan and states she is following her eating plan approximately 25 % of the time.  She states she is exercising: None due to recent exacerbation of back pain.    Interim History:  [name] received 2 steriod injections yesterday morning at [facility]. She reports dramatic increase in hunger and subsequent snacking.  She also endorses insomnia last night.  She provided the following food recall that is typical of a day: Breakfast: 2 greek yogurts Lunch: Eats out: [place]: often meat protein sandwich  Exercise-none due to worsening chronic pain.  Discussed safe exercise options.  Subjective:   1. Lumbar back pain [name] states my back went out 2 weeks ago. She is well known to [facility]. She received 2 steriod injections yesterday morning. She reports dramatic increase in hunger and subsequent snacking.  She also endorses insomnia last night. She reports that the last injections provided 18 months of pain control- she is hoping for similar results.  2. Type 2 diabetes mellitus without complication, unspecified whether long  term insulin  use (HCC) She reports that her fasting CBG will range from upper 120s to low 150s She recently increased Metformin  from 500mg  am to 250mg  pm to 500mg  BID due to recent increase in her A1c  Assessment/Plan:   1. Lumbar back pain Advance activity as tolerated.  2. Type 2 diabetes mellitus without complication, unspecified whether long term insulin  use (HCC) (Primary) Continue Metformin  500mg  BID Reduce sugar/simple CHO intake Continue to monitor home CBG  3. Obesity, current BMI 33.4  [name] is currently in the action stage of change. As such, her goal is to continue with weight loss efforts. She has agreed to the Category 2 Plan.   Exercise goals: Older adults should follow the adult guidelines. When older adults cannot meet the adult guidelines, they should be as physically active as their abilities and conditions will allow.  Older adults should do exercises that maintain or improve balance if they are at risk of falling.  Older adults should determine their level of effort for physical activity relative to their level of fitness.  Older adults with chronic conditions should understand whether and how their conditions affect their ability to do regular physical activity safely.  Try to use YouTube once per week for seated chair exercises/yoga  Behavioral modification strategies: increasing lean protein intake, decreasing simple carbohydrates, increasing vegetables, increasing water  intake, no skipping meals, meal planning and cooking strategies, keeping healthy foods in the home, ways to avoid boredom eating, and planning for success.  [name] has agreed to follow-up with our clinic in 4 weeks.  She was informed of the importance of frequent follow-up visits to maximize her success with intensive lifestyle modifications for her multiple health conditions.   Objective:   Blood pressure (!) 140/68, pulse (!) 58, temperature 98 F (36.7 C), height 5' 6 (1.676 m), weight  207 lb (93.9 kg), SpO2 99%. Body mass index is 33.41 kg/m.  General: Cooperative, alert, well developed, in no acute distress. HEENT: Conjunctivae and lids unremarkable. Cardiovascular: Regular rhythm.  Lungs: Normal work of breathing. Neurologic: No focal deficits.   Lab Results  Component Value Date   CREATININE 0.68 05/02/2023   BUN 18 05/02/2023   NA 137 05/02/2023   K 4.9 05/02/2023   CL 101 05/02/2023   CO2 26 05/02/2023   Lab Results  Component Value Date   ALT 14 05/02/2023   AST 25 05/02/2023   ALKPHOS 119 (H) 10/03/2022   BILITOT 0.4 05/02/2023   Lab Results  Component Value Date   HGBA1C 7.0 (H) 05/08/2023   HGBA1C 6.3 10/03/2022   HGBA1C 6.3 (H) 03/05/2022   HGBA1C 6.3 09/24/2021   HGBA1C 6.6 (H) 06/05/2021   Lab Results  Component Value Date   INSULIN  6.3 03/05/2022   INSULIN  12.9 06/05/2021   INSULIN  7.7 01/25/2021   INSULIN  13.1 04/20/2020   INSULIN  12.1 01/19/2018   Lab Results  Component Value Date   TSH 4.23 09/17/2021   Lab Results  Component Value Date   CHOL 152 05/08/2023   HDL 60.60 05/08/2023   LDLCALC 73 05/08/2023   LDLDIRECT 67.0 04/03/2022   TRIG 92.0 05/08/2023   CHOLHDL 3 05/08/2023   Lab Results  Component Value Date   VD25OH 49 05/02/2023   VD25OH 62.2 03/05/2022   VD25OH 70.1 06/05/2021   Lab Results  Component Value Date   WBC 9.2 05/08/2023   HGB 11.3 (L) 05/08/2023   HCT 35.5 (L) 05/08/2023   MCV 78.2 05/08/2023   PLT 381.0 05/08/2023   Lab Results  Component Value Date   IRON 48 06/12/2022   TIBC 450.8 (H) 06/12/2022   FERRITIN 12.0 06/12/2022   Attestation Statements:   Reviewed by clinician on day of visit: allergies, medications, problem list, medical history, surgical history, family history, social history, and previous encounter notes.  Time spent on visit including pre-visit chart review and post-visit care and charting was 27 minutes.   I have reviewed the above documentation for accuracy and  completeness, and I agree with the above. -  Llewellyn Schoenberger d. Katiria Calame, NP-C

## 2023-08-11 DIAGNOSIS — R3915 Urgency of urination: Secondary | ICD-10-CM | POA: Diagnosis not present

## 2023-08-11 DIAGNOSIS — R351 Nocturia: Secondary | ICD-10-CM | POA: Diagnosis not present

## 2023-08-21 DIAGNOSIS — M5416 Radiculopathy, lumbar region: Secondary | ICD-10-CM | POA: Diagnosis not present

## 2023-08-27 ENCOUNTER — Ambulatory Visit (HOSPITAL_COMMUNITY): Payer: Medicare PPO | Attending: Cardiology

## 2023-08-27 DIAGNOSIS — Z953 Presence of xenogenic heart valve: Secondary | ICD-10-CM | POA: Diagnosis not present

## 2023-08-27 DIAGNOSIS — T8203XA Leakage of heart valve prosthesis, initial encounter: Secondary | ICD-10-CM | POA: Insufficient documentation

## 2023-08-27 LAB — ECHOCARDIOGRAM COMPLETE
AR max vel: 1.38 cm2
AV Area VTI: 1.33 cm2
AV Area mean vel: 1.66 cm2
AV Mean grad: 10 mmHg
AV Peak grad: 20.3 mmHg
Ao pk vel: 2.25 m/s
Area-P 1/2: 2.7 cm2
P 1/2 time: 354 ms
S' Lateral: 3.2 cm

## 2023-08-28 ENCOUNTER — Encounter: Payer: Self-pay | Admitting: Physician Assistant

## 2023-09-01 ENCOUNTER — Other Ambulatory Visit: Payer: Self-pay | Admitting: Family Medicine

## 2023-09-02 ENCOUNTER — Ambulatory Visit (INDEPENDENT_AMBULATORY_CARE_PROVIDER_SITE_OTHER): Payer: Medicare PPO | Admitting: Adult Health

## 2023-09-02 ENCOUNTER — Encounter (INDEPENDENT_AMBULATORY_CARE_PROVIDER_SITE_OTHER): Payer: Self-pay | Admitting: Adult Health

## 2023-09-02 VITALS — BP 155/71 | HR 57 | Temp 98.0°F | Ht 66.0 in | Wt 200.0 lb

## 2023-09-02 DIAGNOSIS — E669 Obesity, unspecified: Secondary | ICD-10-CM | POA: Diagnosis not present

## 2023-09-02 DIAGNOSIS — Z6832 Body mass index (BMI) 32.0-32.9, adult: Secondary | ICD-10-CM | POA: Diagnosis not present

## 2023-09-02 DIAGNOSIS — Z8673 Personal history of transient ischemic attack (TIA), and cerebral infarction without residual deficits: Secondary | ICD-10-CM | POA: Diagnosis not present

## 2023-09-02 DIAGNOSIS — I152 Hypertension secondary to endocrine disorders: Secondary | ICD-10-CM

## 2023-09-02 DIAGNOSIS — E559 Vitamin D deficiency, unspecified: Secondary | ICD-10-CM

## 2023-09-02 DIAGNOSIS — E1159 Type 2 diabetes mellitus with other circulatory complications: Secondary | ICD-10-CM

## 2023-09-02 DIAGNOSIS — E119 Type 2 diabetes mellitus without complications: Secondary | ICD-10-CM

## 2023-09-02 DIAGNOSIS — Z7984 Long term (current) use of oral hypoglycemic drugs: Secondary | ICD-10-CM

## 2023-09-02 DIAGNOSIS — E871 Hypo-osmolality and hyponatremia: Secondary | ICD-10-CM

## 2023-09-02 NOTE — Progress Notes (Signed)
 WEIGHT SUMMARY AND BIOMETRICS  Vitals Temp: 98 F (36.7 C) BP: (!) 155/71 Pulse Rate: (!) 57 SpO2: 99 %   Anthropometric Measurements Height: 5\' 6"  (1.676 m) Weight: 200 lb (90.7 kg) BMI (Calculated): 32.3 Weight at Last Visit: 207 lb Weight Lost Since Last Visit: 7 lb Weight Gained Since Last Visit: 0 Starting Weight: 220 lb Total Weight Loss (lbs): 20 lb (9.072 kg)   Body Composition  Body Fat %: 46.5 % Fat Mass (lbs): 93.43 lbs Muscle Mass (lbs): 102 lbs Total Body Water (lbs): 72.8 lbs Visceral Fat Rating : 14   Other Clinical Data Fasting: no Labs: no Today's Visit #: 31 Starting Date: 01/19/18    Chief Complaint:   OBESITY [name] is here to discuss her progress with her obesity treatment plan.  She is on the the Category 2 Plan and states she is following her eating plan approximately 70 % of the time.  She states she is exercising Seated Strength Training 10 minutes 7 times per week.   Interim History:  [name] was recently started on Oxybutynin by Urology. She reports increased frequency and intensity of pre-existing Migraine HA sx's She experienced Migraine HA last night- she believes that this could be contributing to elevated HTN today.  Of note- She is followed by Dr. Cecilie [name] Q6M for Chronic F/u Next OV early May 2025 She anticipates labs will be completed at next PCP OV  Subjective:   1. History of CVA in adulthood 08/27/2017 Neuro OV Notes- Her ASA 81mg  was switched to Plavix 75mg  and she was advised to take atorvastatin daily.  She was advised to stop Imitrex.  Tylenol is ineffective.  She takes venlafaxine XR 150mg  daily for anxiety.   Labs from 05/23/17 include:  LDL 90, Hgb A1c 6.4   2. Type 2 diabetes mellitus without complication, unspecified whether long term insulin use (HCC) Lab Results  Component Value Date   HGBA1C 7.0 (H) 05/08/2023   HGBA1C 6.3 10/03/2022   HGBA1C 6.3 (H) 03/05/2022    PCP manages  Metformin 568m BID She increased Metformin 500mg  from 1/2 tab to full tab at dinner after A1c bumped up in Nov 2024. She has always been on Metformin 500mg  full tab in am She denies GI upset with current Metformin use  3. Vitamin D deficiency  Latest Reference Range & Units 05/02/23 15:27  Vitamin D, 25-Hydroxy 30 - 100 ng/mL 49   She is currently on OTC MVI and calcium-vitamin D (OSCAL WITH D) 500-200 MG-UNIT per tablet   4. Hyponatremia She denies change in mentation She denies dizziness  Latest Reference Range & Units 10/03/22 13:45 12/19/22 13:00 05/02/23 15:27  Sodium 135 - 146 mmol/L 132 (L) 129 (L) 137  (L): Data is abnormally low  5. HTN associated with T2D BP above goal at OV She takes all antihypertensives in the evening- irbesartan (AVAPRO) 300 MG tablet  amLODipine (NORVASC) 10 MG tablet  cloNIDine (CATAPRES) 0.1 MG tablet  metFORMIN (GLUCOPHAGE) 500 MG tablet  atorvastatin (LIPITOR) 40 MG tablet   Of Note- [name] was recently started on Oxybutynin by Urology. She reports increased frequency and intensity of pre-existing Migraine HA sx's She experienced Migraine HA last night- she believes that this could be contributing to elevated HTN today.  Assessment/Plan:   1. History of CVA in adulthood (Primary) Keep BP well controlled  2. Type 2 diabetes mellitus without complication, unspecified whether long term insulin use (HCC) Continue to limit sugar/simple CHO Continue  to increase lean protein intake and strength training.  3. Vitamin D deficiency Continue OTC MVI and calcium-vitamin D (OSCAL WITH D) 500-200 MG-UNIT per tablet   4. Hyponatremia Monitor for sx's of hyponatremia  5. HTN associated with T2D   6. OBESITY, CURRENT BMI 32.4  [name] is currently in the action stage of change. As such, her goal is to continue with weight loss efforts. She has agreed to the Category 2 Plan.   Exercise goals: Older adults should follow the adult  guidelines. When older adults cannot meet the adult guidelines, they should be as physically active as their abilities and conditions will allow.  Older adults should do exercises that maintain or improve balance if they are at risk of falling.  Older adults should determine their level of effort for physical activity relative to their level of fitness.  Older adults with chronic conditions should understand whether and how their conditions affect their ability to do regular physical activity safely. Purchase easy to done ankle weight-use for seated exercise  Behavioral modification strategies: increasing lean protein intake, decreasing simple carbohydrates, increasing vegetables, increasing water intake, no skipping meals, meal planning and cooking strategies, keeping healthy foods in the home, ways to avoid boredom eating, and planning for success.  [name] has agreed to follow-up with our clinic in 4 weeks. She was informed of the importance of frequent follow-up visits to maximize her success with intensive lifestyle modifications for her multiple health conditions.   Fasting labs with PCP in May/[name]  Objective:   Blood pressure (!) 155/71, pulse (!) 57, temperature 98 F (36.7 C), height 5\' 6"  (1.676 m), weight 200 lb (90.7 kg), SpO2 99%. Body mass index is 32.28 kg/m.  General: Cooperative, alert, well developed, in no acute distress. HEENT: Conjunctivae and lids unremarkable. Cardiovascular: Regular rhythm.  Lungs: Normal work of breathing. Neurologic: No focal deficits.   Lab Results  Component Value Date   CREATININE 0.68 05/02/2023   BUN 18 05/02/2023   NA 137 05/02/2023   K 4.9 05/02/2023   CL 101 05/02/2023   CO2 26 05/02/2023   Lab Results  Component Value Date   ALT 14 05/02/2023   AST 25 05/02/2023   ALKPHOS 119 (H) 10/03/2022   BILITOT 0.4 05/02/2023   Lab Results  Component Value Date   HGBA1C 7.0 (H) 05/08/2023   HGBA1C 6.3 10/03/2022   HGBA1C 6.3  (H) 03/05/2022   HGBA1C 6.3 09/24/2021   HGBA1C 6.6 (H) 06/05/2021   Lab Results  Component Value Date   INSULIN 6.3 03/05/2022   INSULIN 12.9 06/05/2021   INSULIN 7.7 01/25/2021   INSULIN 13.1 04/20/2020   INSULIN 12.1 01/19/2018   Lab Results  Component Value Date   TSH 4.23 09/17/2021   Lab Results  Component Value Date   CHOL 152 05/08/2023   HDL 60.60 05/08/2023   LDLCALC 73 05/08/2023   LDLDIRECT 67.0 04/03/2022   TRIG 92.0 05/08/2023   CHOLHDL 3 05/08/2023   Lab Results  Component Value Date   VD25OH 49 05/02/2023   VD25OH 62.2 03/05/2022   VD25OH 70.1 06/05/2021   Lab Results  Component Value Date   WBC 9.2 05/08/2023   HGB 11.3 (L) 05/08/2023   HCT 35.5 (L) 05/08/2023   MCV 78.2 05/08/2023   PLT 381.0 05/08/2023   Lab Results  Component Value Date   IRON 48 06/12/2022   TIBC 450.8 (H) 06/12/2022   FERRITIN 12.0 06/12/2022   Attestation Statements:   Reviewed by  clinician on day of visit: allergies, medications, problem list, medical history, surgical history, family history, social history, and previous encounter notes.  Time spent on visit including pre-visit chart review and post-visit care and charting was 30 minutes.   I have reviewed the above documentation for accuracy and completeness, and I agree with the above. -  Philopater Mucha d. Naika Noto, NP-C

## 2023-09-10 ENCOUNTER — Ambulatory Visit (INDEPENDENT_AMBULATORY_CARE_PROVIDER_SITE_OTHER): Payer: Medicare PPO

## 2023-09-10 VITALS — Ht 66.0 in | Wt 200.0 lb

## 2023-09-10 DIAGNOSIS — Z0000 Encounter for general adult medical examination without abnormal findings: Secondary | ICD-10-CM | POA: Diagnosis not present

## 2023-09-10 NOTE — Patient Instructions (Signed)
 [name] , Thank you for taking time to come for your Medicare Wellness Visit. I appreciate your ongoing commitment to your health goals. Please review the following plan we discussed and let me know if I can assist you in the future.   Referrals/Orders/Follow-Ups/Clinician Recommendations: Aim for 30 minutes of exercise or brisk walking, 6-8 glasses of water, and 5 servings of fruits and vegetables each day.   This is a list of the screening recommended for you and due dates:  Health Maintenance  Topic Date Due   COVID-19 Vaccine (5 - 2024-25 season) 02/23/2023   Eye exam for diabetics  07/23/2023   Mammogram  09/28/2023   Complete foot exam   10/03/2023   Hemoglobin A1C  11/05/2023   Yearly kidney function blood test for diabetes  05/01/2024   Yearly kidney health urinalysis for diabetes  05/07/2024   Colon Cancer Screening  07/28/2024   Medicare Annual Wellness Visit  09/09/2024   DTaP/Tdap/Td vaccine (5 - Td or Tdap) 11/08/2027   Pneumonia Vaccine  Completed   Flu Shot  Completed   DEXA scan (bone density measurement)  Completed   Hepatitis C Screening  Completed   Zoster (Shingles) Vaccine  Completed   HPV Vaccine  Aged Out    Advanced directives: (Declined) Advance directive discussed with you today. Even though you declined this today, please call our office should you change your mind, and we can give you the proper paperwork for you to fill out.  Next Medicare Annual Wellness Visit scheduled for next year: Yes

## 2023-09-10 NOTE — Progress Notes (Signed)
 Subjective:   [name] is a 73 y.o. who presents for a Medicare Wellness preventive visit.  Visit Complete: Virtual I connected with  [name] on 09/10/23 by a audio enabled telemedicine application and verified that I am speaking with the correct person using two identifiers.  Patient Location: Home  Provider Location: Home Office  I discussed the limitations of evaluation and management by telemedicine. The patient expressed understanding and agreed to proceed.  Vital Signs: Because this visit was a virtual/telehealth visit, some criteria may be missing or patient reported. Any vitals not documented were not able to be obtained and vitals that have been documented are patient reported.  VideoDeclined- This patient declined Librarian, academic. Therefore the visit was completed with audio only.  Persons Participating in Visit: Patient.  AWV Questionnaire: No: Patient Medicare AWV questionnaire was not completed prior to this visit.  Cardiac Risk Factors include: advanced age (>45men, >46 women);hypertension;diabetes mellitus;dyslipidemia;obesity (BMI >30kg/m2)     Objective:    Today's Vitals   09/10/23 1310  Weight: 200 lb (90.7 kg)  Height: 5\' 6"  (1.676 m)   Body mass index is 32.28 kg/m.     09/10/2023    1:19 PM 12/24/2022    8:06 AM 10/14/2022    2:29 PM 09/18/2022    8:44 PM 04/23/2022   10:45 AM 02/19/2022    7:49 AM 02/13/2022   11:26 AM  Advanced Directives  Does Patient Have a Medical Advance Directive? No No No No No Yes Yes  Type of Careers adviser;Living will Healthcare Power of [place];Living will  Does patient want to make changes to medical advance directive?      No - Patient declined   Copy of Healthcare Power of Attorney in Chart?      No - copy requested   Would patient like information on creating a medical advance directive? No - Patient declined No - Patient  declined  No - Patient declined No - Patient declined      Current Medications (verified) Outpatient Encounter Medications as of 09/10/2023  Medication Sig   Accu-Chek Softclix Lancets lancets Use as instructed   amLODipine (NORVASC) 10 MG tablet Take 1 tablet (10 mg total) by mouth daily.   atorvastatin (LIPITOR) 40 MG tablet TAKE 1 TABLET BY MOUTH DAILY AT 6 PM.   Biotin 5 MG TBDP SMARTSIG:1 By Mouth   blood glucose meter kit and supplies KIT Dispense based on patient and insurance preference. Use up to four times daily as directed. Dx: E11.9   calcium-vitamin D (OSCAL WITH D) 500-200 MG-UNIT per tablet Take 2 tablets by mouth daily.    cloNIDine (CATAPRES) 0.1 MG tablet Take 1 tablet (0.1 mg total) by mouth at bedtime.   clopidogrel (PLAVIX) 75 MG tablet TAKE 1 TABLET BY MOUTH EVERY DAY   D-5000 125 MCG (5000 UT) TABS SMARTSIG:1 Tablet(s) By Mouth   demeclocycline (DECLOMYCIN) 150 MG tablet Take 1 tablet (150 mg total) by mouth 2 (two) times daily.   FLUAD 0.5 ML injection    fluticasone (FLONASE) 50 MCG/ACT nasal spray Place 2 sprays into both nostrils daily.   glucose blood (ACCU-CHEK GUIDE) test strip Use to test blood sugars daily. Dx: E11.9   ipratropium (ATROVENT) 0.03 % nasal spray 2 SPRAYS IN EACH NOSTRIL AS NEEDED THREE TIMES AS NEEDED   irbesartan (AVAPRO) 300 MG tablet Take 1 tablet (300 mg total) by mouth daily.  loratadine (CLARITIN) 10 MG tablet Take 10 mg by mouth at bedtime.    Melatonin 5 MG TABS Take 5 mg by mouth at bedtime as needed (sleep).   metFORMIN (GLUCOPHAGE) 500 MG tablet TAKE 1 TABLET BY MOUTH WITH BREAKFAST AND 1/2 TO 1 TABLET WITH DINNER   metoCLOPramide (REGLAN) 10 MG tablet TAKE 1/2 TABLET BY MOUTH 4 TIMES A DAY   metroNIDAZOLE (METROCREAM) 0.75 % cream    Multiple Vitamins-Minerals (MULTIVITAMIN ADULTS) TABS SMARTSIG:1 By Mouth   omeprazole (PRILOSEC) 40 MG capsule Take 1 capsule by mouth daily.   Probiotic Product (ALIGN) 4 MG CAPS Take 4 mg by mouth  daily.    psyllium (METAMUCIL SMOOTH TEXTURE) 28 % packet Take 1 packet by mouth 2 (two) times daily.   sennosides-docusate sodium (SENOKOT-S) 8.6-50 MG tablet Take 2 tablets by mouth daily.   sodium chloride 1 g tablet Take 3 g by mouth 2 (two) times daily with a meal.   traMADol (ULTRAM) 50 MG tablet Take 1 tablet (50 mg total) by mouth every 8 (eight) hours as needed for moderate pain (pain score 4-6) or severe pain (pain score 7-10) (for chronic pain. do not drive for 8 hours after taking).   venlafaxine XR (EFFEXOR-XR) 75 MG 24 hr capsule Take 1 capsule (75 mg total) by mouth daily with breakfast.   clindamycin (CLEOCIN) 300 MG capsule TAKE 2 CAPSULES BY MOUTH 1 HOUR PRIOR TO DENTAL CLEANING. (Patient not taking: Reported on 09/10/2023)   No facility-administered encounter medications on file as of 09/10/2023.    Allergies (verified) Augmentin [amoxicillin-pot clavulanate], Erythromycin, and Penicillins   History: Past Medical History:  Diagnosis Date   Allergy    Anxiety    Arthritis    back & knee   Asthma     mild per pt shows up with resp illness   Colon polyps    Constipation    Diabetes mellitus without complication (HCC)    Gallstones    Gastric polyps    Gastroparesis    GERD (gastroesophageal reflux disease)    Headache    sinus headaches and migraines at times   Heart murmur    History of migraine headaches    HTN (hypertension)    Hyperlipidemia    IBS (irritable bowel syndrome)    Joint pain    Lumbar disc disease    PONV (postoperative nausea and vomiting)    S/P aortic valve replacement with bioprosthetic valve 08/23/2016   25 mm Edwards Intuity rapid-deployment bovine pericardial tissue valve via partial upper mini sternotomy   Sleep apnea    does not use CPAP   TIA (transient ischemic attack)    TIA (transient ischemic attack)    hx of per pt   Past Surgical History:  Procedure Laterality Date   ABDOMINAL HYSTERECTOMY     AORTIC VALVE REPLACEMENT  N/A 08/23/2016   Procedure: AORTIC VALVE REPLACEMENT (AVR) - using partial Upper Sternotomy- 25mm Edwards Intuity Aortic Valve used;  Surgeon: Purcell Nails, MD;  Location: [facility] OR;  Service: Open Heart Surgery;  Laterality: N/A;   BLADDER SUSPENSION  2007   BREAST ENHANCEMENT SURGERY  1980   CARPAL TUNNEL RELEASE Bilateral    COLONOSCOPY     DORSAL COMPARTMENT RELEASE Right 09/15/2014   Procedure: RIGHT WRIST DEQUERVAINS RELEASE ;  Surgeon: Mckinley Jewel, MD;  Location: [facility];  Service: Orthopedics;  Laterality: Right;   DRUG INDUCED ENDOSCOPY N/A 02/19/2022   Procedure: DRUG INDUCED SLEEP ENDOSCOPY;  Surgeon: Osborn Coho, MD;  Location: [facility];  Service: ENT;  Laterality: N/A;   ESOPHAGOGASTRODUODENOSCOPY     EYE SURGERY     cataract surgery   IMPLANTATION OF HYPOGLOSSAL NERVE STIMULATOR Right 04/23/2022   Procedure: IMPLANTATION OF HYPOGLOSSAL NERVE STIMULATOR;  Surgeon: Osborn Coho, MD;  Location: [facility];  Service: ENT;  Laterality: Right;   KNEE ARTHROSCOPY  04/15/2012   Procedure: ARTHROSCOPY KNEE;  Surgeon: Loreta Ave, MD;  Location: [facility] OR;  Service: Orthopedics;  Laterality: Right;   LAPAROSCOPIC CHOLECYSTECTOMY     LASIK     OOPHORECTOMY     PALATE TO GINGIVA GRAFT  2017   REVERSE SHOULDER ARTHROPLASTY Left 02/13/2021   Procedure: REVERSE SHOULDER ARTHROPLASTY;  Surgeon: Teryl Lucy, MD;  Location: [facility];  Service: Orthopedics;  Laterality: Left;   RIGHT/LEFT HEART CATH AND CORONARY ANGIOGRAPHY N/A 08/02/2016   Procedure: Right/Left Heart Cath and Coronary Angiography;  Surgeon: Tonny Bollman, MD;  Location: [facility] INVASIVE CV LAB;  Service: Cardiovascular;  Laterality: N/A;   ROTATOR CUFF REPAIR Left    SHOULDER ARTHROSCOPY WITH ROTATOR CUFF REPAIR AND SUBACROMIAL DECOMPRESSION Right 12/24/2022   Procedure: SHOULDER ARTHROSCOPY WITH DEBRIDEMENT, ROTATOR CUFF REPAIR AND SUBACROMIAL DECOMPRESSION;  Surgeon: Teryl Lucy, MD;  Location: [facility];  Service: Orthopedics;  Laterality: Right;   TEE WITHOUT CARDIOVERSION N/A 08/23/2016   Procedure: TRANSESOPHAGEAL ECHOCARDIOGRAM (TEE);  Surgeon: Purcell Nails, MD;  Location: [facility] OR;  Service: Open Heart Surgery;  Laterality: N/A;   TOTAL KNEE ARTHROPLASTY  04/15/2012   Procedure: TOTAL KNEE ARTHROPLASTY;  Surgeon: Loreta Ave, MD;  Location: [facility] OR;  Service: Orthopedics;  Laterality: Right;   TRIGGER FINGER RELEASE Right 04/20/2015   Procedure: RIGHT TRIGGER FINGER RELEASE (TENDON SHEALTH INCISION) ;  Surgeon: Loreta Ave, MD;  Location: [facility];  Service: Orthopedics;  Laterality: Right;   Family History  Problem Relation Age of Onset   COPD Mother    Colon polyps Mother    Irritable bowel syndrome Mother    Anxiety disorder Mother    Heart disease Father    Alcohol abuse Father    Breast cancer Maternal Grandmother    Gallbladder disease Maternal Grandmother    Colon polyps Maternal Aunt    Diabetes Paternal Uncle        several uncles   Other Neg Hx        hyponatremia   Colon cancer Neg Hx    Esophageal cancer Neg Hx    Rectal cancer Neg Hx    Stomach cancer Neg Hx    Social History   Socioeconomic History   Marital status: Married    Spouse name: Educational psychologist   Number of children: 1   Years of education: Not on file   Highest education level: Master's degree (e.g., MA, MS, MEng, MEd, MSW, MBA)  Occupational History   Occupation: Retired, Visual merchandiser: RETIRED  Tobacco Use   Smoking status: Never   Smokeless tobacco: Never  Vaping Use   Vaping status: Never Used  Substance and Sexual Activity   Alcohol use: No   Drug use: No   Sexual activity: Yes    Birth control/protection: Surgical    Comment: hyst  Other Topics Concern   Not on file  Social History Narrative   She lives with husband (1978) and two dogs. Step-son Camillia Herter (1971-psychiatrist in [place] No  grandkids. 2 dogs-sheltie/collie mix and  border collie mix      Highest level of education:  Master in education   She is retired Clinical biochemist x 32 years.      Hobbies: time with dogs   Right handed   One story home         Social Drivers of Health   Financial Resource Strain: Low Risk  (09/10/2023)   Overall Financial Resource Strain (CARDIA)    Difficulty of Paying Living Expenses: Not hard at all  Food Insecurity: No Food Insecurity (09/10/2023)   Hunger Vital Sign    Worried About Running Out of Food in the Last Year: Never true    Ran Out of Food in the Last Year: Never true  Transportation Needs: No Transportation Needs (09/10/2023)   PRAPARE - Administrator, Civil Service (Medical): No    Lack of Transportation (Non-Medical): No  Physical Activity: Insufficiently Active (09/10/2023)   Exercise Vital Sign    Days of Exercise per Week: 5 days    Minutes of Exercise per Session: 10 min  Stress: No Stress Concern Present (09/10/2023)   [employer] of Occupational Health - Occupational Stress Questionnaire    Feeling of Stress : Not at all  Social Connections: Moderately Isolated (09/10/2023)   Social Connection and Isolation Panel [NHANES]    Frequency of Communication with Friends and Family: Three times a week    Frequency of Social Gatherings with Friends and Family: Three times a week    Attends Religious Services: Never    Active Member of Clubs or Organizations: No    Attends Banker Meetings: Never    Marital Status: Married    Tobacco Counseling Counseling given: Not Answered    Clinical Intake:  Pre-visit preparation completed: Yes  Pain : No/denies pain     BMI - recorded: 32.28 Nutritional Status: BMI > 30  Obese Diabetes: Yes CBG done?: No Did pt. bring in CBG monitor from home?: No  Lab Results  Component Value Date   HGBA1C 7.0 (H) 05/08/2023   HGBA1C 6.3 10/03/2022   HGBA1C 6.3 (H) 03/05/2022     How  often do you need to have someone help you when you read instructions, pamphlets, or other written materials from your doctor or pharmacy?: 1 - Never  Interpreter Needed?: No  Information entered by :: Lanier Ensign, LPN   Activities of Daily Living     09/10/2023    1:13 PM 06/08/2023   10:28 AM  In your present state of health, do you have any difficulty performing the following activities:  Hearing? 0 0  Vision? 0 0  Difficulty concentrating or making decisions? 0 0  Walking or climbing stairs? 0 0  Dressing or bathing? 0 0  Doing errands, shopping? 0 0  Preparing Food and eating ? N N  Using the Toilet? N N  In the past six months, have you accidently leaked urine? N N  Do you have problems with loss of bowel control? N N  Managing your Medications? N N  Managing your Finances? N N  Housekeeping or managing your Housekeeping? N N    Patient Care Team: Shelva Majestic, MD as PCP - General (Family Medicine) Meriam Sprague, MD (Inactive) as PCP - Cardiology (Cardiology) Levi Aland, MD as Consulting Physician (Obstetrics and Gynecology) Drema Dallas, DO as Consulting Physician (Neurology) Alois Cliche, PA-C (Inactive) as Physician Assistant (Specialist) Jethro Bolus, MD as Consulting Physician (Ophthalmology) Romero Belling, MD (  Inactive) as Consulting Physician (Endocrinology) Aris Lot, MD as Consulting Physician (Dermatology) Irena Cords, Enzo Montgomery, MD as Consulting Physician (Allergy and Immunology) Hilarie Fredrickson, MD as Consulting Physician (Gastroenterology) Sheral Apley, MD as Attending Physician (Orthopedic Surgery) Sheral Apley, MD as Attending Physician (Orthopedic Surgery)  Indicate any recent Medical Services you may have received from other than [facility] providers in the past year (date may be approximate).     Assessment:   This is a routine wellness examination for [name].  Hearing/Vision screen Hearing Screening -  Comments:: Pt denies any hearing issues  Vision Screening - Comments:: Pt will follow up with new provider Dr Nile Riggs retired    Goals Addressed   None    Depression Screen     09/10/2023    1:18 PM 05/08/2023   10:55 AM 10/03/2022   12:51 PM 09/24/2021    8:01 AM 04/21/2020   11:03 AM 12/09/2019    8:38 AM 06/08/2019    8:40 AM  PHQ 2/9 Scores  PHQ - 2 Score 0 0 0 0 0 0 0  PHQ- 9 Score 0 0 0 0  0     Fall Risk     09/10/2023    1:20 PM 06/08/2023   10:28 AM 05/08/2023   10:55 AM 10/14/2022    2:29 PM 10/03/2022   12:51 PM  Fall Risk   Falls in the past year? 0 0 0 0 0  Number falls in past yr: 0  0 0 0  Injury with Fall? 0 0 0 0 0  Risk for fall due to : No Fall Risks  No Fall Risks  No Fall Risks  Follow up Falls prevention discussed  Falls evaluation completed Falls evaluation completed Falls evaluation completed    MEDICARE RISK AT HOME:  Medicare Risk at Home Any stairs in or around the home?: Yes If so, are there any without handrails?: No Home free of loose throw rugs in walkways, pet beds, electrical cords, etc?: Yes Adequate lighting in your home to reduce risk of falls?: Yes Life alert?: No Use of a cane, walker or w/c?: No Grab bars in the bathroom?: No Shower chair or bench in shower?: No Elevated toilet seat or a handicapped toilet?: No  TIMED UP AND GO:  Was the test performed?  No  Cognitive Function: 6CIT completed    01/16/2018    3:27 PM  MMSE - Mini Mental State Exam  Not completed: --        09/10/2023    1:20 PM 04/21/2020   11:10 AM  6CIT Screen  What Year? 0 points 0 points  What month? 0 points 0 points  What time? 0 points   Count back from 20 0 points 0 points  Months in reverse 0 points 0 points  Repeat phrase 0 points 0 points  Total Score 0 points     Immunizations Immunization History  Administered Date(s) Administered   Fluad Quad(high Dose 65+) 03/10/2022   Influenza Split 04/04/2011, 03/05/2012   Influenza Whole  04/16/2007, 03/16/2009, 03/13/2010   Influenza, High Dose Seasonal PF 01/31/2019, 02/27/2020, 02/28/2020, 03/27/2021, 03/10/2022, 04/16/2023   Influenza,inj,Quad PF,6+ Mos 03/30/2013, 03/10/2014, 03/23/2015, 02/27/2016   Influenza-Unspecified 02/27/2017, 02/28/2017, 01/31/2018   PFIZER Comirnaty(Gray Top)Covid-19 Tri-Sucrose Vaccine 11/17/2020   PFIZER(Purple Top)SARS-COV-2 Vaccination 08/15/2019, 09/08/2019, 03/31/2020   Pneumococcal Conjugate-13 05/21/2016   Pneumococcal Polysaccharide-23 05/23/2017, 03/27/2021   Td 06/24/1996, 08/06/2007, 11/07/2017   Tdap 11/07/2017   Zoster Recombinant(Shingrix) 01/21/2019, 03/24/2019  Zoster, Live 05/12/2014    Screening Tests Health Maintenance  Topic Date Due   COVID-19 Vaccine (5 - 2024-25 season) 02/23/2023   OPHTHALMOLOGY EXAM  07/23/2023   MAMMOGRAM  09/28/2023   FOOT EXAM  10/03/2023   HEMOGLOBIN A1C  11/05/2023   Diabetic kidney evaluation - eGFR measurement  05/01/2024   Diabetic kidney evaluation - Urine ACR  05/07/2024   Colonoscopy  07/28/2024   Medicare Annual Wellness (AWV)  09/09/2024   DTaP/Tdap/Td (5 - Td or Tdap) 11/08/2027   Pneumonia Vaccine 38+ Years old  Completed   INFLUENZA VACCINE  Completed   DEXA SCAN  Completed   Hepatitis C Screening  Completed   Zoster Vaccines- Shingrix  Completed   HPV VACCINES  Aged Out    Health Maintenance  Health Maintenance Due  Topic Date Due   COVID-19 Vaccine (5 - 2024-25 season) 02/23/2023   OPHTHALMOLOGY EXAM  07/23/2023   Health Maintenance Items Addressed: See Nurse Notes  Additional Screening:  Vision Screening: Recommended annual ophthalmology exams for early detection of glaucoma and other disorders of the eye.  Dental Screening: Recommended annual dental exams for proper oral hygiene  Community Resource Referral / Chronic Care Management: CRR required this visit?  No   CCM required this visit?  No     Plan:     I have personally reviewed and noted the  following in the patient's chart:   Medical and social history Use of alcohol, tobacco or illicit drugs  Current medications and supplements including opioid prescriptions. Patient is currently taking opioid prescriptions. Information provided to patient regarding non-opioid alternatives. Patient advised to discuss non-opioid treatment plan with their provider. Functional ability and status Nutritional status Physical activity Advanced directives List of other physicians Hospitalizations, surgeries, and ER visits in previous 12 months Vitals Screenings to include cognitive, depression, and falls Referrals and appointments  In addition, I have reviewed and discussed with patient certain preventive protocols, quality metrics, and best practice recommendations. A written personalized care plan for preventive services as well as general preventive health recommendations were provided to patient.     Marzella Schlein, LPN   1/61/0960   After Visit Summary: (MyChart) Due to this being a telephonic visit, the after visit summary with patients personalized plan was offered to patient via MyChart   Notes: Nothing significant to report at this time.

## 2023-09-14 ENCOUNTER — Other Ambulatory Visit: Payer: Self-pay | Admitting: Nurse Practitioner

## 2023-09-15 ENCOUNTER — Ambulatory Visit: Payer: Medicare PPO | Attending: Physician Assistant | Admitting: Physician Assistant

## 2023-09-15 ENCOUNTER — Encounter: Payer: Self-pay | Admitting: Physician Assistant

## 2023-09-15 VITALS — BP 128/62 | HR 65 | Ht 66.0 in | Wt 208.8 lb

## 2023-09-15 DIAGNOSIS — E1159 Type 2 diabetes mellitus with other circulatory complications: Secondary | ICD-10-CM

## 2023-09-15 DIAGNOSIS — Z8673 Personal history of transient ischemic attack (TIA), and cerebral infarction without residual deficits: Secondary | ICD-10-CM

## 2023-09-15 DIAGNOSIS — I5032 Chronic diastolic (congestive) heart failure: Secondary | ICD-10-CM

## 2023-09-15 DIAGNOSIS — Z79899 Other long term (current) drug therapy: Secondary | ICD-10-CM

## 2023-09-15 DIAGNOSIS — Z953 Presence of xenogenic heart valve: Secondary | ICD-10-CM

## 2023-09-15 DIAGNOSIS — I351 Nonrheumatic aortic (valve) insufficiency: Secondary | ICD-10-CM

## 2023-09-15 DIAGNOSIS — T8203XD Leakage of heart valve prosthesis, subsequent encounter: Secondary | ICD-10-CM | POA: Diagnosis not present

## 2023-09-15 DIAGNOSIS — G4733 Obstructive sleep apnea (adult) (pediatric): Secondary | ICD-10-CM | POA: Diagnosis not present

## 2023-09-15 DIAGNOSIS — I10 Essential (primary) hypertension: Secondary | ICD-10-CM

## 2023-09-15 DIAGNOSIS — T8203XA Leakage of heart valve prosthesis, initial encounter: Secondary | ICD-10-CM

## 2023-09-15 DIAGNOSIS — E782 Mixed hyperlipidemia: Secondary | ICD-10-CM

## 2023-09-15 DIAGNOSIS — I152 Hypertension secondary to endocrine disorders: Secondary | ICD-10-CM

## 2023-09-15 MED ORDER — CLONIDINE HCL 0.1 MG PO TABS: 0.1000 mg | ORAL_TABLET | Freq: Every day | ORAL | 3 refills | Status: AC

## 2023-09-15 MED ORDER — AMLODIPINE BESYLATE 10 MG PO TABS
10.0000 mg | ORAL_TABLET | Freq: Every day | ORAL | 3 refills | Status: AC
Start: 2023-09-15 — End: ?

## 2023-09-15 MED ORDER — IRBESARTAN 300 MG PO TABS: 300.0000 mg | ORAL_TABLET | Freq: Every day | ORAL | 3 refills | Status: AC

## 2023-09-15 NOTE — Patient Instructions (Signed)
 Medication Instructions:  Your physician recommends that you continue on your current medications as directed. Please refer to the Current Medication list given to you today.  *If you need a refill on your cardiac medications before your next appointment, please call your pharmacy*   Lab Work: NONE If you have labs (blood work) drawn today and your tests are completely normal, you will receive your results only by: MyChart Message (if you have MyChart) OR A paper copy in the mail If you have any lab test that is abnormal or we need to change your treatment, we will call you to review the results.   Testing/Procedures: NONE   Follow-Up: At [facility], you and your health needs are our priority.  As part of our continuing mission to provide you with exceptional heart care, we have created designated Provider Care Teams.  These Care Teams include your primary Cardiologist (physician) and Advanced Practice Providers (APPs -  Physician Assistants and Nurse Practitioners) who all work together to provide you with the care you need, when you need it.  We recommend signing up for the patient portal called "MyChart".  Sign up information is provided on this After Visit Summary.  MyChart is used to connect with patients for Virtual Visits (Telemedicine).  Patients are able to view lab/test results, encounter notes, upcoming appointments, etc.  Non-urgent messages can be sent to your provider as well.   To learn more about what you can do with MyChart, go to [email or web address].    Your next appointment:   6 month(s)  Provider:   Jacques Navy, MD  Other Instructions   1st Floor: - Lobby - Registration  - Pharmacy  - Lab - Cafe  2nd Floor: - PV Lab - Diagnostic Testing (echo, CT, nuclear med)  3rd Floor: - Vacant  4th Floor: - TCTS (cardiothoracic surgery) - AFib Clinic - Structural Heart Clinic - Vascular Surgery  - Vascular Ultrasound  5th Floor: - HeartCare  Cardiology (general and EP) - Clinical Pharmacy for coumadin, hypertension, lipid, weight-loss medications, and med management appointments    Valet parking services will be available as well.

## 2023-09-15 NOTE — Progress Notes (Signed)
 Office Visit    Patient Name: [name] Date of Encounter: 09/15/2023  PCP:  Shelva Majestic, MD   [facility] Cardiologist:  Meriam Sprague, MD (Inactive)  Advanced Practice Provider:  No care team member to display Electrophysiologist:  None    Chief Complaint    [name] is a 73 y.o. female with a hx of Diabetes mellitus type 2, hyperlipidemia, OSA on CPAP and aortic valve replacement  presents today for 6 month follow-up to establish care. She was a previous patient of Dr. Delton See and Dr. Shari Prows.   Past Medical History    Past Medical History:  Diagnosis Date   Allergy    Anxiety    Arthritis    back & knee   Asthma     mild per pt shows up with resp illness   Colon polyps    Constipation    Diabetes mellitus without complication (HCC)    Gallstones    Gastric polyps    Gastroparesis    GERD (gastroesophageal reflux disease)    Headache    sinus headaches and migraines at times   Heart murmur    History of migraine headaches    HTN (hypertension)    Hyperlipidemia    IBS (irritable bowel syndrome)    Joint pain    Lumbar disc disease    PONV (postoperative nausea and vomiting)    S/P aortic valve replacement with bioprosthetic valve 08/23/2016   25 mm Edwards Intuity rapid-deployment bovine pericardial tissue valve via partial upper mini sternotomy   Sleep apnea    does not use CPAP   TIA (transient ischemic attack)    TIA (transient ischemic attack)    hx of per pt   Past Surgical History:  Procedure Laterality Date   ABDOMINAL HYSTERECTOMY     AORTIC VALVE REPLACEMENT N/A 08/23/2016   Procedure: AORTIC VALVE REPLACEMENT (AVR) - using partial Upper Sternotomy- 25mm Edwards Intuity Aortic Valve used;  Surgeon: Purcell Nails, MD;  Location: [facility] OR;  Service: Open Heart Surgery;  Laterality: N/A;   BLADDER SUSPENSION  2007   BREAST ENHANCEMENT SURGERY  1980   CARPAL TUNNEL RELEASE Bilateral     COLONOSCOPY     DORSAL COMPARTMENT RELEASE Right 09/15/2014   Procedure: RIGHT WRIST DEQUERVAINS RELEASE ;  Surgeon: Mckinley Jewel, MD;  Location: [facility];  Service: Orthopedics;  Laterality: Right;   DRUG INDUCED ENDOSCOPY N/A 02/19/2022   Procedure: DRUG INDUCED SLEEP ENDOSCOPY;  Surgeon: Osborn Coho, MD;  Location: [facility];  Service: ENT;  Laterality: N/A;   ESOPHAGOGASTRODUODENOSCOPY     EYE SURGERY     cataract surgery   IMPLANTATION OF HYPOGLOSSAL NERVE STIMULATOR Right 04/23/2022   Procedure: IMPLANTATION OF HYPOGLOSSAL NERVE STIMULATOR;  Surgeon: Osborn Coho, MD;  Location: [facility];  Service: ENT;  Laterality: Right;   KNEE ARTHROSCOPY  04/15/2012   Procedure: ARTHROSCOPY KNEE;  Surgeon: Loreta Ave, MD;  Location: [facility] OR;  Service: Orthopedics;  Laterality: Right;   LAPAROSCOPIC CHOLECYSTECTOMY     LASIK     OOPHORECTOMY     PALATE TO GINGIVA GRAFT  2017   REVERSE SHOULDER ARTHROPLASTY Left 02/13/2021   Procedure: REVERSE SHOULDER ARTHROPLASTY;  Surgeon: Teryl Lucy, MD;  Location: [facility];  Service: Orthopedics;  Laterality: Left;   RIGHT/LEFT HEART CATH AND CORONARY ANGIOGRAPHY N/A 08/02/2016   Procedure: Right/Left Heart Cath and Coronary Angiography;  Surgeon: Tonny Bollman,  MD;  Location: [facility];  Service: Cardiovascular;  Laterality: N/A;   ROTATOR CUFF REPAIR Left    SHOULDER ARTHROSCOPY WITH ROTATOR CUFF REPAIR AND SUBACROMIAL DECOMPRESSION Right 12/24/2022   Procedure: SHOULDER ARTHROSCOPY WITH DEBRIDEMENT, ROTATOR CUFF REPAIR AND SUBACROMIAL DECOMPRESSION;  Surgeon: Teryl Lucy, MD;  Location: [facility];  Service: Orthopedics;  Laterality: Right;   TEE WITHOUT CARDIOVERSION N/A 08/23/2016   Procedure: TRANSESOPHAGEAL ECHOCARDIOGRAM (TEE);  Surgeon: Purcell Nails, MD;  Location: [facility] OR;  Service: Open Heart Surgery;  Laterality: N/A;   TOTAL KNEE ARTHROPLASTY  04/15/2012    Procedure: TOTAL KNEE ARTHROPLASTY;  Surgeon: Loreta Ave, MD;  Location: [facility] OR;  Service: Orthopedics;  Laterality: Right;   TRIGGER FINGER RELEASE Right 04/20/2015   Procedure: RIGHT TRIGGER FINGER RELEASE (TENDON SHEALTH INCISION) ;  Surgeon: Loreta Ave, MD;  Location:  SURGERY CENTER;  Service: Orthopedics;  Laterality: Right;    Allergies  Allergies  Allergen Reactions   Augmentin [Amoxicillin-Pot Clavulanate] Rash   Erythromycin Nausea Only   Penicillins Itching and Rash    Has patient had a PCN reaction causing immediate rash, facial/tongue/throat swelling, SOB or lightheadedness with hypotension: No Has patient had a PCN reaction causing severe rash involving mucus membranes or skin necrosis: No Has patient had a PCN reaction that required hospitalization: No Has patient had a PCN reaction occurring within the last 10 years: No  If all of the above answers are "NO", then may proceed with Cephalosporin use. Tolerated Ancef 02/13/21    History of Present Illness    [name] is a 73 y.o. female with a hx of diabetes mellitus type 2, hyperlipidemia, OSA on CPAP, and aortic valve replacement with bioprosthetic bovine pericardial tissue valve on 08/23/2016.   She was last seen March 2022 by Dr. Delton See.  Patient had only minor nonobstructive CAD on heart catheterization back in February 2018 which was done in preparation for her AVR.  Also her right heart cath showed normal right heart hemodynamics.  Echocardiogram showed LVEF of 60 to 65% and grade 1 diastolic dysfunction.  She underwent complete rehab program and dietary program and has lost 30 pounds.  When she was last seen by Dr. Delton See she had been doing great and denied any lower extremity edema, orthopnea, or proximal nocturnal dyspnea.  She was not limited to any of her daily activities at that time.  She noticed that her blood pressure has been elevated into the 140s.  She is compliant with all her  medications and a low-sodium diet.  At that time her most recent echocardiogram was reviewed which was done in March 2022 and showed normal transaortic gradients with mean 14 mmHg, stable mild to moderate perivalvular leak, she had a 3 out of 6 diastolic murmur.  Dr. Delton See recommended annual echocardiograms and close following of her symptoms.  She was asymptomatic at that time.  She was also euvolemic and doing great with her exercise and diet program.  Her lipid panel was at goal on atorvastatin.  She recently had a left reverse total shoulder arthroplasty by Dr. Dion Saucier on 02/13/2021 and is currently undergoing physical therapy for this.  She is also being followed by the healthy weight and wellness clinic and undergoing lifestyle modifications.  She was seen by me November 2022, she feels good from a heart standpoint. She has been recovering from her left shoulder surgery that she had back in August. She is doing PT twice a week  at this point and tolerating well. She still is having some pain from her surgery but no chest pain. She has not had any swelling in her feet. She has not had any shortness of breath. She otherwise feels good today. She has not had a Lipid panel since 2020. We will plan to repeat this when she has her labs done in December. She has been compliant with all her medications but her BP was high today 172/70, however on repeat it was 142/72. She is usually " normal" when they check her BP at healthy weight and wellness.   Reports no shortness of breath nor dyspnea on exertion. Reports no chest pain, pressure, or tightness. No edema, orthopnea, PND. Reports no palpitations.    She was seen March 2024 by Dr. Shari Prows.  Stated that she did sleep study and felt exhausted that day.  Had a lot of caffeine prior to her appointment which she thinks could be contributing to her elevated blood pressure.  Otherwise, no chest pain, shortness of breath, lower extreme edema, orthopnea, PND, or  palpitations.  Blood pressure mainly in the 120s at a healthy weight and wellness appointment.  Tolerating medications as prescribed.  Has lost around 30 pounds and continuing to work on weight loss and is very determined to do so.  Today, she presents with a history of heart disease and right rotator cuff repair for a routine follow-up visit. She reports no new cardiac symptoms such as chest pain, palpitations, or shortness of breath. The patient's blood pressure has been well controlled on current medication regimen, which includes clonidine, amlodipine, and Avapro. The patient has also been actively managing her weight with the help of a wellness team and has seen a steady decrease in weight.  The patient underwent right rotator cuff repair and has since recovered well with no reported issues with mobility or strength. She underwent physical therapy post-surgery and reports a return to baseline function. The patient also has a history of sleep apnea and is currently using an Inspire system, which she reports has been working well for her.  The patient's lab results from the fall show an A1c of 7, total cholesterol of 152, LDL of 73, HDL of 60, and triglycerides of 92. The patient reports that her sodium levels are typically low and she is currently taking sodium tablets twice a day as well as demeclocycline to help raise her sodium levels.  Reports no shortness of breath nor dyspnea on exertion. Reports no chest pain, pressure, or tightness. No edema, orthopnea, PND. Reports no palpitations.   Discussed the use of AI scribe software for clinical note transcription with the patient, who gave verbal consent to proceed.  EKGs/Labs/Other Studies Reviewed:   The following studies were reviewed today:  ECHOCARDIOGRAM 08/21/2020 IMPRESSI dONS     1. Left ventricular ejection fraction, by estimation, is 60 to 65%. Left  ventricular ejection fraction by 3D volume is 59 %. The left ventricle has   normal function. The left ventricle has no regional wall motion  abnormalities. Left ventricular diastolic   parameters are consistent with Grade II diastolic dysfunction  (pseudonormalization).   2. Right ventricular systolic function is normal. The right ventricular  size is normal. There is normal pulmonary artery systolic pressure. The  estimated right ventricular systolic pressure is 21.7 mmHg.   3. The mitral valve is normal in structure. Mild mitral valve  regurgitation. No evidence of mitral stenosis.   4. The aortic valve has been repaired/replaced.  There is a 25 mm Edwards  Intuit valve present in the aortic position. Aortic valve regurgitation is  not visualized. No aortic stenosis is present. There is moderate  perivalvular aortic insufficiency.  Aortic regurgitation PHT measures 406 msec. Aortic valve mean gradient  measures 14.0 mmHg. Aortic valve peak gradient measures 25.8 mmHg. Aortic  valve area, by VTI measures 1.93 cm.   5. Aortic dilatation noted. There is borderline dilatation of the  ascending aorta, measuring 37 mm.   6. The inferior vena cava is normal in size with greater than 50%  respiratory variability, suggesting right atrial pressure of 3 mmHg.   7. Left atrial size was severely dilated.   CARDIAC CATH 08/02/2016  Left Anterior Descending  There is mild the vessel.  Left Circumflex  Vessel is angiographically normal.  Right Coronary Artery  The vessel exhibits minimal luminal irregularities.  Intervention  No interventions have been documented. Coronary Diagrams  Diagnostic Dominance: Right   EKG:  No EKG done today.   Recent Labs: 05/02/2023: ALT 14; BUN 18; Creat 0.68; Potassium 4.9; Sodium 137 05/08/2023: Hemoglobin 11.3; Platelets 381.0  Recent Lipid Panel    Component Value Date/Time   CHOL 152 05/08/2023 1149   CHOL 144 06/05/2021 1230   TRIG 92.0 05/08/2023 1149   HDL 60.60 05/08/2023 1149   HDL 64 06/05/2021 1230   CHOLHDL 3  05/08/2023 1149   VLDL 18.4 05/08/2023 1149   LDLCALC 73 05/08/2023 1149   LDLCALC 65 06/05/2021 1230   LDLDIRECT 67.0 04/03/2022 1000    Home Medications   Current Meds  Medication Sig   Accu-Chek Softclix Lancets lancets Use as instructed   atorvastatin (LIPITOR) 40 MG tablet TAKE 1 TABLET BY MOUTH DAILY AT 6 PM.   Biotin 5 MG TBDP SMARTSIG:1 By Mouth   blood glucose meter kit and supplies KIT Dispense based on patient and insurance preference. Use up to four times daily as directed. Dx: E11.9   calcium-vitamin D (OSCAL WITH D) 500-200 MG-UNIT per tablet Take 2 tablets by mouth daily.    clopidogrel (PLAVIX) 75 MG tablet TAKE 1 TABLET BY MOUTH EVERY DAY   D-5000 125 MCG (5000 UT) TABS SMARTSIG:1 Tablet(s) By Mouth   demeclocycline (DECLOMYCIN) 150 MG tablet Take 1 tablet (150 mg total) by mouth 2 (two) times daily.   FLUAD 0.5 ML injection    fluticasone (FLONASE) 50 MCG/ACT nasal spray Place 2 sprays into both nostrils daily.   glucose blood (ACCU-CHEK GUIDE) test strip Use to test blood sugars daily. Dx: E11.9   ipratropium (ATROVENT) 0.03 % nasal spray 2 SPRAYS IN EACH NOSTRIL AS NEEDED THREE TIMES AS NEEDED   loratadine (CLARITIN) 10 MG tablet Take 10 mg by mouth at bedtime.    Melatonin 5 MG TABS Take 5 mg by mouth at bedtime as needed (sleep).   metFORMIN (GLUCOPHAGE) 500 MG tablet TAKE 1 TABLET BY MOUTH WITH BREAKFAST AND 1/2 TO 1 TABLET WITH DINNER   metoCLOPramide (REGLAN) 10 MG tablet TAKE 1/2 TABLET BY MOUTH 4 TIMES A DAY   metroNIDAZOLE (METROCREAM) 0.75 % cream    Multiple Vitamins-Minerals (MULTIVITAMIN ADULTS) TABS SMARTSIG:1 By Mouth   omeprazole (PRILOSEC) 40 MG capsule Take 1 capsule by mouth daily.   Probiotic Product (ALIGN) 4 MG CAPS Take 4 mg by mouth daily.    psyllium (METAMUCIL SMOOTH TEXTURE) 28 % packet Take 1 packet by mouth 2 (two) times daily.   sennosides-docusate sodium (SENOKOT-S) 8.6-50 MG tablet Take 2 tablets by mouth  daily.   sodium chloride 1 g  tablet Take 3 g by mouth 2 (two) times daily with a meal.   traMADol (ULTRAM) 50 MG tablet Take 1 tablet (50 mg total) by mouth every 8 (eight) hours as needed for moderate pain (pain score 4-6) or severe pain (pain score 7-10) (for chronic pain. do not drive for 8 hours after taking).   venlafaxine XR (EFFEXOR-XR) 75 MG 24 hr capsule Take 1 capsule (75 mg total) by mouth daily with breakfast.   [DISCONTINUED] amLODipine (NORVASC) 10 MG tablet Take 1 tablet (10 mg total) by mouth daily.   [DISCONTINUED] cloNIDine (CATAPRES) 0.1 MG tablet Take 1 tablet (0.1 mg total) by mouth at bedtime.   [DISCONTINUED] irbesartan (AVAPRO) 300 MG tablet Take 1 tablet (300 mg total) by mouth daily.     Review of Systems      Endorses left arm pain and limited range of motion from her surgery.   Physical Exam    VS:  BP 128/62   Pulse 65   Ht 5\' 6"  (1.676 m)   Wt 208 lb 12.8 oz (94.7 kg)   LMP  (LMP Unknown)   SpO2 98%   BMI 33.70 kg/m  , BMI Body mass index is 33.7 kg/m.  Wt Readings from Last 3 Encounters:  09/15/23 208 lb 12.8 oz (94.7 kg)  09/10/23 200 lb (90.7 kg)  09/02/23 200 lb (90.7 kg)     GEN: Well nourished, well developed, in no acute distress. HEENT: normal. Cardiac: RRR, no murmurs, rubs, or gallops. No clubbing, cyanosis, edema.  Radials/PT 2+ and equal bilaterally.  Respiratory:  Respirations regular and unlabored, clear to auscultation bilaterally. GI: Soft, nontender, nondistended. MS: No deformity or atrophy. Skin: Warm and dry, no rash. Neuro:  Strength and sensation are intact. Psych: Normal affect.  Assessment & Plan    Status post minimally invasive aortic valve replacement with bioprosthetic valve 08/23/2016 -most recent echo reviewed with the patient from 08/27/23. Mild perivalvular leak which is consistent with previous study. Normal EF.  -continue current medications  Chronic diastolic HF -euvolemic on exam -encouraged daily weights at home  Hypertension Blood  pressure controlled with current medications. EKG and echocardiogram normal with mild impaired relaxation and unchanged mild aortic valve regurgitation. - Refilled clonidine, amlodipine, and Avapro.  Type 2 Diabetes Mellitus A1c at 7, indicating good glycemic control.  Hyperlipidemia Cholesterol levels well-controlled. -continue Lipitor 40mg  daily  Hyponatremia Chronic hyponatremia stable with sodium tablets  -no LE edema and BP is well controlled  Obstructive Sleep Apnea Managed with Inspire system, effective and well-tolerated.  Follow-up Stable with current management, transitioning care to Dr. Jacques Navy. - Scheduled follow-up in six months with Dr. Jacques Navy.   Disposition: Follow up in 6 month(s) with MD or APP  Signed, Sharlene Dory, PA-C 09/15/2023, 4:29 PM [facility]

## 2023-10-21 ENCOUNTER — Encounter (INDEPENDENT_AMBULATORY_CARE_PROVIDER_SITE_OTHER): Payer: Self-pay | Admitting: Adult Health

## 2023-10-21 ENCOUNTER — Ambulatory Visit (INDEPENDENT_AMBULATORY_CARE_PROVIDER_SITE_OTHER): Admitting: Adult Health

## 2023-10-21 VITALS — BP 129/68 | HR 67 | Temp 99.8°F | Ht 66.0 in | Wt 201.0 lb

## 2023-10-21 DIAGNOSIS — M545 Low back pain, unspecified: Secondary | ICD-10-CM | POA: Diagnosis not present

## 2023-10-21 DIAGNOSIS — Z6832 Body mass index (BMI) 32.0-32.9, adult: Secondary | ICD-10-CM | POA: Diagnosis not present

## 2023-10-21 DIAGNOSIS — E669 Obesity, unspecified: Secondary | ICD-10-CM | POA: Diagnosis not present

## 2023-10-21 DIAGNOSIS — E119 Type 2 diabetes mellitus without complications: Secondary | ICD-10-CM

## 2023-10-21 DIAGNOSIS — E871 Hypo-osmolality and hyponatremia: Secondary | ICD-10-CM

## 2023-10-21 DIAGNOSIS — Z7984 Long term (current) use of oral hypoglycemic drugs: Secondary | ICD-10-CM

## 2023-10-21 NOTE — Addendum Note (Signed)
 Addended byAnitra Ket, Halsey Persaud L on: 10/21/2023 02:43 PM   Modules accepted: Orders

## 2023-10-21 NOTE — Progress Notes (Signed)
 WEIGHT SUMMARY AND BIOMETRICS  Vitals Temp: 99.8 F (37.7 C) BP: 129/68 Pulse Rate: 67 SpO2: 98 %   Anthropometric Measurements Height: 5\' 6"  (1.676 m) Weight: 201 lb (91.2 kg) BMI (Calculated): 32.46 Weight at Last Visit: 200 lb Weight Lost Since Last Visit: 0 Weight Gained Since Last Visit: 1 lb Starting Weight: 220 lb Total Weight Loss (lbs): 19 lb (8.618 kg)   Body Composition  Body Fat %: 46.2 % Fat Mass (lbs): 93 lbs Muscle Mass (lbs): 103 lbs Total Body Water  (lbs): 73 lbs Visceral Fat Rating : 14   Other Clinical Data Fasting: no Labs: no Today's Visit #: 59 Starting Date: 01/19/18    Chief Complaint:   OBESITY [name] is here to discuss her progress with her obesity treatment plan.  She is on the the Category 2 Plan and states she is following her eating plan approximately 60 % of the time.  She states she is exercising: NEAT Activities  Worsening LBP and L hip pain- limits mobility and activity  11/06/2023 Chronic f/u with PCP She anticipates that Dr. Arlene Ben will order labs at this OV  Of Note- She was briefly trialed on Ozempic  0.25 May/June 2022 She took only one dose and significant constipation ensued. She prefers to remain off GLP-1 therapy  Subjective:   1. Lumbar back pain She reports LBP and L Hip pain > 5 years She is followed by [facility]. She has f/u first week of May 2025  2. Type 2 diabetes mellitus without complication, unspecified whether long term insulin  use (HCC) Lab Results  Component Value Date   HGBA1C 7.0 (H) 05/08/2023   HGBA1C 6.3 10/03/2022   HGBA1C 6.3 (H) 03/05/2022    PCP manages Metformin  500mg  BID   She was briefly trialed on Ozempic  0.25 May/June 2022 She took only one dose and significant constipation ensued. She prefers to remain off GLP-1 therapy  She reports fasting CBG ranges 130-140s This morning her fasting CBG was elevated to 155 She is currently  experiencing increased L hip and LBP  3. Hyponatremia She denies dizziness, confusion.  Latest Reference Range & Units 10/03/22 13:45 12/19/22 13:00 05/02/23 15:27  Sodium 135 - 146 mmol/L 132 (L) 129 (L) 137  (L): Data is abnormally low  Assessment/Plan:   1. Lumbar back pain Continue current pain management and f/u with established Orthopedic Specialist  2. Type 2 diabetes mellitus without complication, unspecified whether long term insulin  use (HCC) (Primary) Continue daily Metformin  500mg  Continue Cat 2 Meal Plan Add in seated exercises: YouTube  3. Hyponatremia Monitor for change in mental status Do not over hydrate with water   4. Obesity, CURRENT BMI 32.5  [name] is currently in the action stage of change. As such, her goal is to continue with weight loss efforts. She has agreed to the Category 2 Plan.   Exercise goals: Older adults should follow the adult guidelines. When older adults cannot meet the adult guidelines, they should be as physically active as their abilities and conditions will allow.  Older adults should do exercises that maintain or improve balance if they are at risk of falling.  Older adults should determine their level of effort for physical activity relative to their level of fitness.  Older adults with chronic conditions should understand whether and how their conditions affect their ability to do regular physical activity safely.  Behavioral modification strategies: increasing lean protein intake, decreasing simple carbohydrates, increasing vegetables, increasing water  intake, meal planning and  cooking strategies, keeping healthy foods in the home, ways to avoid boredom eating, and planning for success.  [name] has agreed to follow-up with our clinic in 4 weeks. She was informed of the importance of frequent follow-up visits to maximize her success with intensive lifestyle modifications for her multiple health conditions.   Objective:   Blood  pressure 129/68, pulse 67, temperature 99.8 F (37.7 C), height 5\' 6"  (1.676 m), weight 201 lb (91.2 kg), SpO2 98%. Body mass index is 32.44 kg/m.  General: Cooperative, alert, well developed, in no acute distress. HEENT: Conjunctivae and lids unremarkable. Cardiovascular: Regular rhythm.  Lungs: Normal work of breathing. Neurologic: No focal deficits.   Lab Results  Component Value Date   CREATININE 0.68 05/02/2023   BUN 18 05/02/2023   NA 137 05/02/2023   K 4.9 05/02/2023   CL 101 05/02/2023   CO2 26 05/02/2023   Lab Results  Component Value Date   ALT 14 05/02/2023   AST 25 05/02/2023   ALKPHOS 119 (H) 10/03/2022   BILITOT 0.4 05/02/2023   Lab Results  Component Value Date   HGBA1C 7.0 (H) 05/08/2023   HGBA1C 6.3 10/03/2022   HGBA1C 6.3 (H) 03/05/2022   HGBA1C 6.3 09/24/2021   HGBA1C 6.6 (H) 06/05/2021   Lab Results  Component Value Date   INSULIN  6.3 03/05/2022   INSULIN  12.9 06/05/2021   INSULIN  7.7 01/25/2021   INSULIN  13.1 04/20/2020   INSULIN  12.1 01/19/2018   Lab Results  Component Value Date   TSH 4.23 09/17/2021   Lab Results  Component Value Date   CHOL 152 05/08/2023   HDL 60.60 05/08/2023   LDLCALC 73 05/08/2023   LDLDIRECT 67.0 04/03/2022   TRIG 92.0 05/08/2023   CHOLHDL 3 05/08/2023   Lab Results  Component Value Date   VD25OH 49 05/02/2023   VD25OH 62.2 03/05/2022   VD25OH 70.1 06/05/2021   Lab Results  Component Value Date   WBC 9.2 05/08/2023   HGB 11.3 (L) 05/08/2023   HCT 35.5 (L) 05/08/2023   MCV 78.2 05/08/2023   PLT 381.0 05/08/2023   Lab Results  Component Value Date   IRON 48 06/12/2022   TIBC 450.8 (H) 06/12/2022   FERRITIN 12.0 06/12/2022    Attestation Statements:   Reviewed by clinician on day of visit: allergies, medications, problem list, medical history, surgical history, family history, social history, and previous encounter notes.  "25 minutes spent face-to-face with the patient discussing management  of disordered eating symptoms, reviewing current medications, and providing strategies for coping with emotional eating."  I have reviewed the above documentation for accuracy and completeness, and I agree with the above. -  Jaimie Redditt d. Correne Lalani, NP-C

## 2023-10-28 DIAGNOSIS — L814 Other melanin hyperpigmentation: Secondary | ICD-10-CM | POA: Diagnosis not present

## 2023-10-28 DIAGNOSIS — D2261 Melanocytic nevi of right upper limb, including shoulder: Secondary | ICD-10-CM | POA: Diagnosis not present

## 2023-10-28 DIAGNOSIS — D225 Melanocytic nevi of trunk: Secondary | ICD-10-CM | POA: Diagnosis not present

## 2023-10-28 DIAGNOSIS — D2271 Melanocytic nevi of right lower limb, including hip: Secondary | ICD-10-CM | POA: Diagnosis not present

## 2023-10-28 DIAGNOSIS — D2272 Melanocytic nevi of left lower limb, including hip: Secondary | ICD-10-CM | POA: Diagnosis not present

## 2023-10-28 DIAGNOSIS — L821 Other seborrheic keratosis: Secondary | ICD-10-CM | POA: Diagnosis not present

## 2023-10-28 DIAGNOSIS — D2262 Melanocytic nevi of left upper limb, including shoulder: Secondary | ICD-10-CM | POA: Diagnosis not present

## 2023-10-28 DIAGNOSIS — L570 Actinic keratosis: Secondary | ICD-10-CM | POA: Diagnosis not present

## 2023-10-28 DIAGNOSIS — L910 Hypertrophic scar: Secondary | ICD-10-CM | POA: Diagnosis not present

## 2023-10-28 DIAGNOSIS — D485 Neoplasm of uncertain behavior of skin: Secondary | ICD-10-CM | POA: Diagnosis not present

## 2023-10-30 DIAGNOSIS — M5416 Radiculopathy, lumbar region: Secondary | ICD-10-CM | POA: Diagnosis not present

## 2023-11-03 ENCOUNTER — Other Ambulatory Visit: Payer: Self-pay | Admitting: Family Medicine

## 2023-11-06 ENCOUNTER — Encounter: Payer: Self-pay | Admitting: Family Medicine

## 2023-11-06 ENCOUNTER — Ambulatory Visit: Payer: Medicare PPO | Admitting: Family Medicine

## 2023-11-06 ENCOUNTER — Ambulatory Visit: Payer: Self-pay | Admitting: Family Medicine

## 2023-11-06 VITALS — BP 110/60 | HR 82 | Ht 66.0 in | Wt 203.0 lb

## 2023-11-06 DIAGNOSIS — I10 Essential (primary) hypertension: Secondary | ICD-10-CM | POA: Diagnosis not present

## 2023-11-06 DIAGNOSIS — Z7984 Long term (current) use of oral hypoglycemic drugs: Secondary | ICD-10-CM

## 2023-11-06 DIAGNOSIS — E785 Hyperlipidemia, unspecified: Secondary | ICD-10-CM

## 2023-11-06 DIAGNOSIS — E119 Type 2 diabetes mellitus without complications: Secondary | ICD-10-CM

## 2023-11-06 DIAGNOSIS — E1169 Type 2 diabetes mellitus with other specified complication: Secondary | ICD-10-CM

## 2023-11-06 LAB — COMPREHENSIVE METABOLIC PANEL WITH GFR
ALT: 19 U/L (ref 0–35)
AST: 26 U/L (ref 0–37)
Albumin: 4.4 g/dL (ref 3.5–5.2)
Alkaline Phosphatase: 117 U/L (ref 39–117)
BUN: 16 mg/dL (ref 6–23)
CO2: 27 meq/L (ref 19–32)
Calcium: 9 mg/dL (ref 8.4–10.5)
Chloride: 96 meq/L (ref 96–112)
Creatinine, Ser: 0.56 mg/dL (ref 0.40–1.20)
GFR: 90.84 mL/min (ref >60.00)
Glucose, Bld: 192 mg/dL — ABNORMAL HIGH (ref 70–99)
Potassium: 4.2 meq/L (ref 3.5–5.1)
Sodium: 131 meq/L — ABNORMAL LOW (ref 135–145)
Total Bilirubin: 0.4 mg/dL (ref 0.2–1.2)
Total Protein: 6.9 g/dL (ref 6.0–8.3)

## 2023-11-06 LAB — CBC WITH DIFFERENTIAL/PLATELET
Basophils Absolute: 0.1 10*3/uL (ref 0.0–0.1)
Basophils Relative: 0.7 % (ref 0.0–3.0)
Eosinophils Absolute: 0.1 10*3/uL (ref 0.0–0.7)
Eosinophils Relative: 0.9 % (ref 0.0–5.0)
HCT: 34.5 % — ABNORMAL LOW (ref 36.0–46.0)
Hemoglobin: 11.1 g/dL — ABNORMAL LOW (ref 12.0–15.0)
Lymphocytes Relative: 27.7 % (ref 12.0–46.0)
Lymphs Abs: 2.1 10*3/uL (ref 0.7–4.0)
MCHC: 32.2 g/dL (ref 30.0–36.0)
MCV: 77.8 fl — ABNORMAL LOW (ref 78.0–100.0)
Monocytes Absolute: 0.6 10*3/uL (ref 0.1–1.0)
Monocytes Relative: 7.9 % (ref 3.0–12.0)
Neutro Abs: 4.7 10*3/uL (ref 1.4–7.7)
Neutrophils Relative %: 62.8 % (ref 43.0–77.0)
Platelets: 391 10*3/uL (ref 150.0–400.0)
RBC: 4.44 Mil/uL (ref 3.87–5.11)
RDW: 15.7 % — ABNORMAL HIGH (ref 11.5–15.5)
WBC: 7.5 10*3/uL (ref 4.0–10.5)

## 2023-11-06 LAB — LDL CHOLESTEROL, DIRECT: Direct LDL: 72 mg/dL

## 2023-11-06 LAB — MICROALBUMIN / CREATININE URINE RATIO
Creatinine,U: 157 mg/dL
Microalb Creat Ratio: 39.9 mg/g — ABNORMAL HIGH (ref 0.0–30.0)
Microalb, Ur: 6.3 mg/dL — ABNORMAL HIGH (ref 0.0–1.9)

## 2023-11-06 LAB — HEMOGLOBIN A1C: Hgb A1c MFr Bld: 6.7 % — ABNORMAL HIGH (ref 4.6–6.5)

## 2023-11-06 MED ORDER — CLINDAMYCIN HCL 300 MG PO CAPS: ORAL_CAPSULE | ORAL | 2 refills | Status: AC

## 2023-11-06 NOTE — Patient Instructions (Addendum)
 Get Diabetic Eye Exam and Mammogram scheduled.  Please stop by lab before you go If you have mychart- we will send your results within 3 business days of us  receiving them.  If you do not have mychart- we will call you about results within 5 business days of us  receiving them.  *please also note that you will see labs on mychart as soon as they post. I will later go in and write notes on them- will say "notes from Dr. Arlene Ben"   Recommended follow up: Return in about 6 months (around 05/08/2024) for physical or sooner if needed.Schedule b4 you leave.

## 2023-11-06 NOTE — Progress Notes (Signed)
 Phone [phone] In person visit   Subjective:   [name] is a 73 y.o. year old very pleasant female patient who presents for/with See problem oriented charting Chief Complaint  Patient presents with   Medical Management of Chronic Issues    Needs to schedule Mammo.   Hyperlipidemia   Hypertension   Diabetes    DEE due next month    Past Medical History-  Patient Active Problem List   Diagnosis Date Noted   History of CVA in adulthood 08/13/2017    Priority: High   S/P minimally invasive aortic valve replacement with bioprosthetic valve 08/23/2016    Priority: High   Diastolic dysfunction     Priority: High   Diabetes mellitus (HCC) 05/12/2014    Priority: High   Vitamin D  deficiency 06/04/2018    Priority: Medium    Hyponatremia 02/17/2018    Priority: Medium    Primary hypertension 01/02/2017    Priority: Medium    Osteopenia 05/27/2016    Priority: Medium    OSA (obstructive sleep apnea) 02/27/2016    Priority: Medium    Elevated alkaline phosphatase level 05/16/2015    Priority: Medium    GERD (gastroesophageal reflux disease) 03/10/2014    Priority: Medium    Generalized anxiety disorder 03/10/2014    Priority: Medium    Migraines 03/10/2014    Priority: Medium    Irritable bowel syndrome 12/15/2008    Priority: Medium    Hyperlipidemia associated with type 2 diabetes mellitus (HCC) 02/09/2008    Priority: Medium    Blurry vision 08/13/2017    Priority: Low   Bruxism 10/23/2015    Priority: Low   Atypical chest pain 11/10/2014    Priority: Low   Acne 05/12/2014    Priority: Low   EUSTACHIAN TUBE DYSFUNCTION, CHRONIC 06/14/2010    Priority: Low   Low back pain 08/11/2008    Priority: Low   Allergic rhinitis 02/12/2007    Priority: Low   Asthma 02/12/2007    Priority: Low   S/P reverse total shoulder arthroplasty, left 02/13/2021    Priority: 1.   Class 2 severe obesity with serious comorbidity and body mass index (BMI) of 36.0 to  36.9 in adult [facility]) 05/09/2022   Type 2 diabetes mellitus with obesity (HCC) 05/09/2022   Hypertension associated with type 2 diabetes mellitus (HCC) 05/09/2022   SOB (shortness of breath) 12/10/2021   Idiopathic hyperphosphatasia 08/03/2019    Medications- reviewed and updated Current Outpatient Medications  Medication Sig Dispense Refill   Accu-Chek Softclix Lancets lancets Use as instructed 100 each 12   amLODipine  (NORVASC ) 10 MG tablet Take 1 tablet (10 mg total) by mouth daily. 90 tablet 3   atorvastatin  (LIPITOR) 40 MG tablet TAKE 1 TABLET BY MOUTH DAILY AT 6 PM. 90 tablet 3   Biotin 5 MG TBDP SMARTSIG:1 By Mouth     blood glucose meter kit and supplies KIT Dispense based on patient and insurance preference. Use up to four times daily as directed. Dx: E11.9 1 each 0   calcium -vitamin D  (OSCAL WITH D) 500-200 MG-UNIT per tablet Take 2 tablets by mouth daily.      cloNIDine  (CATAPRES ) 0.1 MG tablet Take 1 tablet (0.1 mg total) by mouth at bedtime. 90 tablet 3   clopidogrel  (PLAVIX ) 75 MG tablet TAKE 1 TABLET BY MOUTH EVERY DAY 90 tablet 3   D-5000 125 MCG (5000 UT) TABS SMARTSIG:1 Tablet(s) By Mouth     demeclocycline (DECLOMYCIN) 150 MG tablet  Take 1 tablet (150 mg total) by mouth 2 (two) times daily. 180 tablet 3   fluticasone  (FLONASE ) 50 MCG/ACT nasal spray Place 2 sprays into both nostrils daily.     glucose blood (ACCU-CHEK GUIDE) test strip Use to test blood sugars daily. Dx: E11.9 100 strip 12   ipratropium (ATROVENT) 0.03 % nasal spray 2 SPRAYS IN EACH NOSTRIL AS NEEDED THREE TIMES AS NEEDED     irbesartan  (AVAPRO ) 300 MG tablet Take 1 tablet (300 mg total) by mouth daily. 90 tablet 3   loratadine  (CLARITIN ) 10 MG tablet Take 10 mg by mouth at bedtime.      Melatonin 5 MG TABS Take 5 mg by mouth at bedtime as needed (sleep).     metFORMIN  (GLUCOPHAGE ) 500 MG tablet Take 500 mg by mouth 2 (two) times daily with a meal.     metoCLOPramide  (REGLAN ) 10 MG tablet TAKE 1/2 TABLET  BY MOUTH 4 TIMES A DAY 60 tablet 3   metroNIDAZOLE (METROCREAM) 0.75 % cream      Multiple Vitamins-Minerals (MULTIVITAMIN ADULTS) TABS SMARTSIG:1 By Mouth     omeprazole  (PRILOSEC) 40 MG capsule Take 1 capsule by mouth daily.     oxybutynin (DITROPAN-XL) 5 MG 24 hr tablet      Probiotic Product (ALIGN) 4 MG CAPS Take 4 mg by mouth daily.      psyllium (METAMUCIL SMOOTH TEXTURE) 28 % packet Take 1 packet by mouth 2 (two) times daily.     sennosides-docusate sodium  (SENOKOT-S) 8.6-50 MG tablet Take 2 tablets by mouth daily. 30 tablet 1   sodium chloride  1 g tablet Take 3 g by mouth 2 (two) times daily with a meal.     venlafaxine  XR (EFFEXOR -XR) 75 MG 24 hr capsule Take 1 capsule (75 mg total) by mouth daily with breakfast. 90 capsule 3   clindamycin  (CLEOCIN ) 300 MG capsule TAKE 2 CAPSULES BY MOUTH 1 HOUR PRIOR TO DENTAL CLEANING. 6 capsule 2   traMADol  (ULTRAM ) 50 MG tablet TAKE 1 TABLET (50 MG TOTAL) BY MOUTH EVERY 8 (EIGHT) HOURS AS NEEDED FOR MODERATE PAIN (PAIN SCORE 4-6) OR SEVERE PAIN (PAIN SCORE 7-10) (FOR CHRONIC PAIN. DO NOT DRIVE FOR 8 HOURS AFTER TAKING). (Patient not taking: Reported on 11/06/2023) 60 tablet 3   No current facility-administered medications for this visit.     Objective:  BP 110/60   Pulse 82   Ht 5\' 6"  (1.676 m)   Wt 203 lb (92.1 kg)   LMP  (LMP Unknown)   SpO2 99%   BMI 32.77 kg/m  Gen: NAD, resting comfortably CV: RRR no murmurs rubs or gallops Lungs: CTAB no crackles, wheeze, rhonchi Ext: no edema Skin: warm, dry Neuro: grossly normal, moves all extremities  Diabetic foot exam was performed with the following findings:   No deformities, ulcerations, or other skin breakdown Normal sensation of 10g monofilament Intact posterior tibialis and dorsalis pedis pulses        Assessment and Plan   #2 injections with Dr. Ibazebo in back last Thursday- may affect a1c. Thinks maybe 10% better. May get hip injection as well -has tramadol   available  #History of stroke-remains on Plavix  long-term   %s/p aortic valve replacement with bioprosthetic valve- requires dental prophylaxis. Follows with cardiology. Catheterization 08/02/16 with minor nonobstructive CAD.   % OSA- Prior CPAP- sees pulmonary  -now status post hypoglossal nerve stimulator inspire implant 04/23/22  %obesity- working hard with weigh to wellness program  #SIADH- has seen Dr. ELlison, now  Dr. Rosalea Collin- on demeclocycline and also on sodium and fluid restrictions - also alk phos followed there. Negative bone scan 2021 for pagets and recent vitamin D  ok.   # Diabetes S: compliant with metformin  500mg  BID in past- now down to 500 AM only Lab Results  Component Value Date   HGBA1C 7.0 (H) 05/08/2023   HGBA1C 6.3 10/03/2022   HGBA1C 6.3 (H) 03/05/2022  A/P: hopefully stable- update a1c today. Continue current meds for now   I discussed the microalbumin to creatinine lab error with patient that occurred with [place] labs.  Essentially the ratio was off by a factor of 10.  In this patient's individual case  most recnet # adjusts to 24 from 2.4 so still in range- update today . Is already on irbesartan    #hypertension/diastolic dysfunction (prior listed as CHF but no chronic lasix  need and no hospitalization for HF) S:medication: amlodipine  10mg , irbesartan  300mg ,  now on clonidine  0.1mg  nightly to see if helps with sweating issues- some help - Prior on metoprolol  12.5mg  BID and lasix  BP Readings from Last 3 Encounters:  11/06/23 110/60  10/21/23 129/68  09/15/23 128/62  A/P: hypertension stable- continue current medicines   #hyperlipidemia/history of stroke S: for lipids compliant with  atorvastatin  40mg .  Lab Results  Component Value Date   CHOL 152 05/08/2023   HDL 60.60 05/08/2023   LDLCALC 73 05/08/2023   LDLDIRECT 67.0 04/03/2022   TRIG 92.0 05/08/2023   CHOLHDL 3 05/08/2023   A/P: last year LDL just a hair above goal- check direct LDL- could  bump atorvastatin  up if still above gaol  # generalized anxiety S: patient is compliant with venlafaxine  75mg .  We are using lower dose as had more hyponatremia in the past- possible SIADH.  A/P: working well- continue current medications    # Migraines - released with Dr. Festus Hubert S:patient with history of stroke- sumatriptan  was stopped due to stroke history. Venlafaxine  mentioned for migraine prophylaxis. PRN treatment with ibuprofen  or execrin with reglan  per Dr. Festus Hubert- thought lower risk than sumatriptan  . Tramadol  also been tried -3 since last visit all related to ditropan trial with urology A/P: migraines overall stable lately- continue current medications    # Asthma/allergic rhinitis- Dr. Seabron Cypress S: doing well  recently with prn albuterol  only. Compliant with claritin , astenlin, nasonex, saline. 2020 stopped allergy shots A/P: stable- continue current medicines    # GERD S:Dr. Elvin Hammer has advised higher dose omeprazole  at 40mg  per patient so she wants to continue this dose- compliant and occasional breakthrough symptoms- belches and can taste food  A/P: reasonable control other than with belching   # Low Bone density (formerly osteopenia) S: Last DEXA:  04/12/22 with osteopenia but in range where could be treated- holding off until dental work completed also worried about reflux  Calcium : 1200mg  (through diet ok) recommended - taking Vitamin D : 1000 units a day recommended- taking  A/P: next visit - plan is to repeat  DEXA 03/2024 and reevaluate- work on calcium /vitamin D /weight bearing exercise  Recommended follow up: Return in about 6 months (around 05/08/2024) for physical or sooner if needed.Schedule b4 you leave. Future Appointments  Date Time Provider Department Center  11/27/2023 12:30 PM Margie Sheller, NP [facility] None  01/01/2024 11:30 AM [facility] HOME SLEEP STUDY [facility]  01/15/2024 11:30 AM Lind Repine, MD [facility]  05/03/2024  3:00 PM Emilie Harden,  MD [facility] None  05/11/2024 11:00 AM Almira Jaeger, MD [facility]  09/14/2024  1:00 PM [facility] ANNUAL WELLNESS VISIT 1 [facility]    Lab/Order associations:   ICD-10-CM   1. Type 2 diabetes mellitus without complication, unspecified whether long term insulin  use (HCC)  E11.9 Urine Microalbumin w/creat. ratio    Hemoglobin A1c    CBC with Differential/Platelet    Comprehensive metabolic panel with GFR    2. Hyperlipidemia associated with type 2 diabetes mellitus (HCC)  E11.69 Direct LDL   E78.5     3. Primary hypertension  I10       Meds ordered this encounter  Medications   clindamycin  (CLEOCIN ) 300 MG capsule    Sig: TAKE 2 CAPSULES BY MOUTH 1 HOUR PRIOR TO DENTAL CLEANING.    Dispense:  6 capsule    Refill:  2    Return precautions advised.  Clarisa Crooked, MD

## 2023-11-07 ENCOUNTER — Encounter: Payer: Self-pay | Admitting: Family Medicine

## 2023-11-14 ENCOUNTER — Other Ambulatory Visit: Payer: Self-pay | Admitting: Family Medicine

## 2023-11-25 DIAGNOSIS — M47816 Spondylosis without myelopathy or radiculopathy, lumbar region: Secondary | ICD-10-CM | POA: Diagnosis not present

## 2023-11-25 DIAGNOSIS — M533 Sacrococcygeal disorders, not elsewhere classified: Secondary | ICD-10-CM | POA: Diagnosis not present

## 2023-11-27 ENCOUNTER — Ambulatory Visit (INDEPENDENT_AMBULATORY_CARE_PROVIDER_SITE_OTHER): Admitting: Adult Health

## 2023-11-27 ENCOUNTER — Encounter (INDEPENDENT_AMBULATORY_CARE_PROVIDER_SITE_OTHER): Payer: Self-pay | Admitting: Adult Health

## 2023-11-27 VITALS — BP 113/66 | HR 78 | Temp 99.1°F | Ht 66.0 in | Wt 203.0 lb

## 2023-11-27 DIAGNOSIS — E1169 Type 2 diabetes mellitus with other specified complication: Secondary | ICD-10-CM | POA: Diagnosis not present

## 2023-11-27 DIAGNOSIS — Z7984 Long term (current) use of oral hypoglycemic drugs: Secondary | ICD-10-CM

## 2023-11-27 DIAGNOSIS — I10 Essential (primary) hypertension: Secondary | ICD-10-CM

## 2023-11-27 DIAGNOSIS — E785 Hyperlipidemia, unspecified: Secondary | ICD-10-CM | POA: Diagnosis not present

## 2023-11-27 DIAGNOSIS — M545 Low back pain, unspecified: Secondary | ICD-10-CM | POA: Diagnosis not present

## 2023-11-27 DIAGNOSIS — Z6832 Body mass index (BMI) 32.0-32.9, adult: Secondary | ICD-10-CM

## 2023-11-27 DIAGNOSIS — E669 Obesity, unspecified: Secondary | ICD-10-CM | POA: Diagnosis not present

## 2023-11-27 DIAGNOSIS — E119 Type 2 diabetes mellitus without complications: Secondary | ICD-10-CM

## 2023-11-27 MED ORDER — EMPAGLIFLOZIN 10 MG PO TABS
10.0000 mg | ORAL_TABLET | Freq: Every day | ORAL | 11 refills | Status: AC
Start: 2023-11-27 — End: ?

## 2023-11-27 NOTE — Progress Notes (Signed)
 WEIGHT SUMMARY AND BIOMETRICS  Vitals Temp: 99.1 F (37.3 C) BP: 113/66 Pulse Rate: 78 SpO2: 99 %   Anthropometric Measurements Height: 5\' 6"  (1.676 m) Weight: 203 lb (92.1 kg) BMI (Calculated): 32.78 Weight at Last Visit: 201 lb Weight Lost Since Last Visit: 0 Weight Gained Since Last Visit: 2 lb Starting Weight: 220 lb Total Weight Loss (lbs): 17 lb (7.711 kg)   Body Composition  Body Fat %: 46.8 % Fat Mass (lbs): 95.2 lbs Muscle Mass (lbs): 103 lbs Total Body Water  (lbs): 75 lbs Visceral Fat Rating : 14   Other Clinical Data Fasting: no Labs: no Today's Visit #: 51 Starting Date: 01/19/18    Chief Complaint:   OBESITY [name] is here to discuss her progress with her obesity treatment plan.  She is on the the Category 2 Plan and states she is following her eating plan approximately 60 % of the time.  She states she is exercising Walking 15 minutes 3 times per week.  Interim History:  She received 2 lumbar back injections on 11/25/2023  He fasting CBG was slightly elevated at 145 this morning. Blood Pressure at goal at OV  She had OV with fasting labs with PCP/Dr. Arlene Ben on 11/06/2023 Reviewed notes and lab results  Subjective:   1. Type 2 diabetes mellitus without complication, unspecified whether long term insulin  use (HCC) Lab Results  Component Value Date   HGBA1C 6.7 (H) 11/06/2023   HGBA1C 7.0 (H) 05/08/2023   HGBA1C 6.3 10/03/2022    Fasting CBG will routinely run <130 She received 2 lumbar back injections on 11/25/2023 He fasting CBG was slightly elevated at 145 this morning. She denies sx's of hypoglycemia She is currently on Metformin  500mg  BID with meals- tolerating well She has long standing chronic constipation- was only able to tolerate only one dose of Ozempic  0.25mg - caused 7 days of constipation. PCP recently recommended adding on Jardiance to Metformin  therapy- she is agreeable  Discussed that some pts who are intolerant  of Ozempic  can tolerate Mounjaro- consider in the future  2. Hyperlipidemia associated with type 2 diabetes mellitus (HCC) Lipid Panel     Component Value Date/Time   CHOL 152 05/08/2023 1149   CHOL 144 06/05/2021 1230   TRIG 92.0 05/08/2023 1149   HDL 60.60 05/08/2023 1149   HDL 64 06/05/2021 1230   CHOLHDL 3 05/08/2023 1149   VLDL 18.4 05/08/2023 1149   LDLCALC 73 05/08/2023 1149   LDLCALC 65 06/05/2021 1230   LDLDIRECT 72.0 11/06/2023 1157   LABVLDL 15 06/05/2021 1230    PCP manages daily Lipitor 40mg   3. Primary hypertension She received 2 lumbar back injections on 11/25/2023 He fasting CBG was slightly elevated at 145 this morning. Blood Pressure at goal at OV She denies CP with exertion She is currently  atorvastatin  (LIPITOR) 40 MG tablet  amLODipine  (NORVASC ) 10 MG tablet  cloNIDine  (CATAPRES ) 0.1 MG tablet  irbesartan  (AVAPRO ) 300 MG tablet   4. Lumbar back pain Chronic Pain She received 2 lumbar back injections on 11/25/2023 Pain still present and ambulation is a challenge She is not using any assistive devices today at OV  Assessment/Plan:   1. Type 2 diabetes mellitus without complication, unspecified whether long term insulin  use (HCC) (Primary) Staff message sent to PCP, pt agreeable to start Jardiance  2. Hyperlipidemia associated with type 2 diabetes mellitus (HCC) Limit sat fat Continue statin therapy Remain as active as tolerated  3. Primary hypertension Remain as active as  tolerated Continue atorvastatin  (LIPITOR) 40 MG tablet  amLODipine  (NORVASC ) 10 MG tablet  cloNIDine  (CATAPRES ) 0.1 MG tablet  irbesartan  (AVAPRO ) 300 MG tablet   4. Lumbar back pain Remain as active as tolerated  5. Obesity, CURRENT BMI 32.9  [name] is currently in the action stage of change. As such, her goal is to continue with weight loss efforts. She has agreed to the Category 2 Plan.   Exercise goals: Older adults should follow the adult guidelines. When older  adults cannot meet the adult guidelines, they should be as physically active as their abilities and conditions will allow.  Older adults should do exercises that maintain or improve balance if they are at risk of falling.  Older adults should determine their level of effort for physical activity relative to their level of fitness.  Older adults with chronic conditions should understand whether and how their conditions affect their ability to do regular physical activity safely.  Behavioral modification strategies: increasing lean protein intake, decreasing simple carbohydrates, increasing vegetables, increasing water  intake, no skipping meals, meal planning and cooking strategies, keeping healthy foods in the home, ways to avoid boredom eating, and planning for success.  Morissa has agreed to follow-up with our clinic in 4 weeks. She was informed of the importance of frequent follow-up visits to maximize her success with intensive lifestyle modifications for her multiple health conditions.   Objective:   Blood pressure 113/66, pulse 78, temperature 99.1 F (37.3 C), height 5\' 6"  (1.676 m), weight 203 lb (92.1 kg), SpO2 99%. Body mass index is 32.77 kg/m.  General: Cooperative, alert, well developed, in no acute distress. HEENT: Conjunctivae and lids unremarkable. Cardiovascular: Regular rhythm.  Lungs: Normal work of breathing. Neurologic: No focal deficits.   Lab Results  Component Value Date   CREATININE 0.56 11/06/2023   BUN 16 11/06/2023   NA 131 (L) 11/06/2023   K 4.2 11/06/2023   CL 96 11/06/2023   CO2 27 11/06/2023   Lab Results  Component Value Date   ALT 19 11/06/2023   AST 26 11/06/2023   ALKPHOS 117 11/06/2023   BILITOT 0.4 11/06/2023   Lab Results  Component Value Date   HGBA1C 6.7 (H) 11/06/2023   HGBA1C 7.0 (H) 05/08/2023   HGBA1C 6.3 10/03/2022   HGBA1C 6.3 (H) 03/05/2022   HGBA1C 6.3 09/24/2021   Lab Results  Component Value Date   INSULIN  6.3  03/05/2022   INSULIN  12.9 06/05/2021   INSULIN  7.7 01/25/2021   INSULIN  13.1 04/20/2020   INSULIN  12.1 01/19/2018   Lab Results  Component Value Date   TSH 4.23 09/17/2021   Lab Results  Component Value Date   CHOL 152 05/08/2023   HDL 60.60 05/08/2023   LDLCALC 73 05/08/2023   LDLDIRECT 72.0 11/06/2023   TRIG 92.0 05/08/2023   CHOLHDL 3 05/08/2023   Lab Results  Component Value Date   VD25OH 49 05/02/2023   VD25OH 62.2 03/05/2022   VD25OH 70.1 06/05/2021   Lab Results  Component Value Date   WBC 7.5 11/06/2023   HGB 11.1 (L) 11/06/2023   HCT 34.5 (L) 11/06/2023   MCV 77.8 (L) 11/06/2023   PLT 391.0 11/06/2023   Lab Results  Component Value Date   IRON 48 06/12/2022   TIBC 450.8 (H) 06/12/2022   FERRITIN 12.0 06/12/2022   Attestation Statements:   Reviewed by clinician on day of visit: allergies, medications, problem list, medical history, surgical history, family history, social history, and previous encounter notes.  I have  reviewed the above documentation for accuracy and completeness, and I agree with the above. -  Awad Gladd d. Deep Bonawitz, NP-C

## 2023-11-28 ENCOUNTER — Other Ambulatory Visit: Payer: Self-pay | Admitting: Physical Medicine and Rehabilitation

## 2023-11-28 DIAGNOSIS — M545 Low back pain, unspecified: Secondary | ICD-10-CM

## 2023-12-02 ENCOUNTER — Encounter (HOSPITAL_BASED_OUTPATIENT_CLINIC_OR_DEPARTMENT_OTHER): Payer: Self-pay | Admitting: Emergency Medicine

## 2023-12-02 ENCOUNTER — Emergency Department (HOSPITAL_BASED_OUTPATIENT_CLINIC_OR_DEPARTMENT_OTHER)

## 2023-12-02 ENCOUNTER — Emergency Department (HOSPITAL_BASED_OUTPATIENT_CLINIC_OR_DEPARTMENT_OTHER)
Admission: EM | Admit: 2023-12-02 | Discharge: 2023-12-03 | Disposition: A | Discharge status: Home / Self Care | Attending: Emergency Medicine | Admitting: Emergency Medicine

## 2023-12-02 DIAGNOSIS — E119 Type 2 diabetes mellitus without complications: Secondary | ICD-10-CM | POA: Insufficient documentation

## 2023-12-02 DIAGNOSIS — G43809 Other migraine, not intractable, without status migrainosus: Secondary | ICD-10-CM | POA: Insufficient documentation

## 2023-12-02 DIAGNOSIS — Z79899 Other long term (current) drug therapy: Secondary | ICD-10-CM | POA: Insufficient documentation

## 2023-12-02 DIAGNOSIS — Z7902 Long term (current) use of antithrombotics/antiplatelets: Secondary | ICD-10-CM | POA: Insufficient documentation

## 2023-12-02 DIAGNOSIS — J45909 Unspecified asthma, uncomplicated: Secondary | ICD-10-CM | POA: Insufficient documentation

## 2023-12-02 DIAGNOSIS — I10 Essential (primary) hypertension: Secondary | ICD-10-CM | POA: Insufficient documentation

## 2023-12-02 DIAGNOSIS — Z7951 Long term (current) use of inhaled steroids: Secondary | ICD-10-CM | POA: Insufficient documentation

## 2023-12-02 DIAGNOSIS — R519 Headache, unspecified: Secondary | ICD-10-CM | POA: Diagnosis not present

## 2023-12-02 DIAGNOSIS — Z7984 Long term (current) use of oral hypoglycemic drugs: Secondary | ICD-10-CM | POA: Insufficient documentation

## 2023-12-02 LAB — RESP PANEL BY RT-PCR (RSV, FLU A&B, COVID)  RVPGX2
Influenza A by PCR: NEGATIVE
Influenza B by PCR: NEGATIVE
Resp Syncytial Virus by PCR: NEGATIVE
SARS Coronavirus 2 by RT PCR: NEGATIVE

## 2023-12-02 MED ORDER — MAGNESIUM SULFATE 2 GM/50ML IV SOLN
2.0000 g | Freq: Once | INTRAVENOUS | Status: AC
  Administered 2023-12-02: 2 g via INTRAVENOUS
  Filled 2023-12-02: qty 50

## 2023-12-02 MED ORDER — KETOROLAC TROMETHAMINE 30 MG/ML IJ SOLN
30.0000 mg | Freq: Once | INTRAMUSCULAR | Status: AC
  Administered 2023-12-03: 30 mg via INTRAVENOUS
  Filled 2023-12-02: qty 1

## 2023-12-02 MED ORDER — DIPHENHYDRAMINE HCL 50 MG/ML IJ SOLN
12.5000 mg | Freq: Once | INTRAMUSCULAR | Status: AC
  Administered 2023-12-02: 12.5 mg via INTRAVENOUS
  Filled 2023-12-02: qty 1

## 2023-12-02 MED ORDER — METOCLOPRAMIDE HCL 5 MG/ML IJ SOLN
10.0000 mg | Freq: Once | INTRAMUSCULAR | Status: AC
  Administered 2023-12-02: 10 mg via INTRAVENOUS
  Filled 2023-12-02: qty 2

## 2023-12-02 MED ORDER — SODIUM CHLORIDE 0.9 % IV BOLUS
500.0000 mL | Freq: Once | INTRAVENOUS | Status: AC
  Administered 2023-12-02: 500 mL via INTRAVENOUS

## 2023-12-02 NOTE — ED Triage Notes (Signed)
 Headache onset 4 pm associated n/v. Reports h/o migraines and takes tramadol  at home. Unable to keep medications down.

## 2023-12-03 ENCOUNTER — Encounter: Payer: Self-pay | Admitting: Family Medicine

## 2023-12-03 MED ORDER — DIVALPROEX SODIUM 250 MG PO DR TAB
500.0000 mg | DELAYED_RELEASE_TABLET | Freq: Once | ORAL | Status: AC
  Administered 2023-12-03: 500 mg via ORAL
  Filled 2023-12-03: qty 2

## 2023-12-03 MED ORDER — ACETAMINOPHEN 500 MG PO TABS
1000.0000 mg | ORAL_TABLET | Freq: Once | ORAL | Status: AC
  Administered 2023-12-03: 1000 mg via ORAL
  Filled 2023-12-03: qty 2

## 2023-12-03 MED ORDER — DEXAMETHASONE SODIUM PHOSPHATE 4 MG/ML IJ SOLN
4.0000 mg | Freq: Once | INTRAMUSCULAR | Status: AC
  Administered 2023-12-03: 4 mg via INTRAVENOUS
  Filled 2023-12-03: qty 1

## 2023-12-03 NOTE — ED Provider Notes (Signed)
 [place] [facility] AT [facility] Provider Note   CSN: [id number] Arrival date & time: 12/02/23  2231     History  Chief Complaint  Patient presents with   Headache    [name] is a 73 y.o. female.  The history is provided by the patient.  Headache Pain location:  Frontal Quality:  Dull Radiates to:  Does not radiate Severity currently:  10/10 Severity at highest:  10/10 Onset quality:  Gradual Duration:  8 hours Timing:  Constant Chronicity:  Recurrent Similar to prior headaches: yes   Relieved by:  Nothing Worsened by:  Nothing Associated symptoms: no back pain, no blurred vision, no congestion, no dizziness, no eye pain, no facial pain, no fever, no loss of balance, no near-syncope, no neck pain, no neck stiffness, no paresthesias, no seizures, no syncope, no tingling, no URI, no visual change and no weakness   Risk factors: no family hx of SAH   Patient with known migraines who gets one a month was unable to keep down home medication and came in for relief.  No fevers, no neck pain.  No weakness no numbness no changes in vision or speech.  Not sudden onset.  No atypical freatures.      Past Medical History:  Diagnosis Date   Allergy    Anxiety    Arthritis    back & knee   Asthma     mild per pt shows up with resp illness   Colon polyps    Constipation    Diabetes mellitus without complication (HCC)    Gallstones    Gastric polyps    Gastroparesis    GERD (gastroesophageal reflux disease)    Headache    sinus headaches and migraines at times   Heart murmur    History of migraine headaches    HTN (hypertension)    Hyperlipidemia    IBS (irritable bowel syndrome)    Joint pain    Lumbar disc disease    PONV (postoperative nausea and vomiting)    S/P aortic valve replacement with bioprosthetic valve 08/23/2016   25 mm [employer] rapid-deployment bovine pericardial tissue valve via partial upper mini sternotomy    Sleep apnea    does not use CPAP   TIA (transient ischemic attack)    TIA (transient ischemic attack)    hx of per pt     Home Medications Prior to Admission medications   Medication Sig Start Date End Date Taking? Authorizing Provider  Accu-Chek Softclix Lancets lancets Use as instructed 05/08/23   Almira Jaeger, MD  amLODipine  (NORVASC ) 10 MG tablet Take 1 tablet (10 mg total) by mouth daily. 09/15/23   Von Grumbling, PA-C  atorvastatin  (LIPITOR) 40 MG tablet TAKE 1 TABLET BY MOUTH DAILY AT 6 PM. 05/29/23   Almira Jaeger, MD  Biotin 5 MG TBDP SMARTSIG:1 By Mouth 10/18/21   [provider]  blood glucose meter kit and supplies KIT Dispense based on patient and insurance preference. Use up to four times daily as directed. Dx: E11.9 12/10/19   Almira Jaeger, MD  calcium -vitamin D  (OSCAL WITH D) 500-200 MG-UNIT per tablet Take 2 tablets by mouth daily.     [provider]  clindamycin  (CLEOCIN ) 300 MG capsule TAKE 2 CAPSULES BY MOUTH 1 HOUR PRIOR TO DENTAL CLEANING. 11/06/23   Almira Jaeger, MD  cloNIDine  (CATAPRES ) 0.1 MG tablet Take 1 tablet (0.1 mg total) by mouth at bedtime. 09/15/23  Rueben Cote, Tessa N, PA-C  clopidogrel  (PLAVIX ) 75 MG tablet TAKE 1 TABLET BY MOUTH EVERY DAY 09/02/23   Almira Jaeger, MD  D-5000 125 MCG (5000 UT) TABS SMARTSIG:1 Tablet(s) By Mouth 10/18/21   [provider]  demeclocycline (DECLOMYCIN) 150 MG tablet Take 1 tablet (150 mg total) by mouth 2 (two) times daily. 07/11/23   Emilie Harden, MD  empagliflozin  (JARDIANCE ) 10 MG TABS tablet Take 1 tablet (10 mg total) by mouth daily. 11/27/23   Almira Jaeger, MD  fluticasone  (FLONASE ) 50 MCG/ACT nasal spray Place 2 sprays into both nostrils daily.    [provider]  glucose blood (ACCU-CHEK GUIDE) test strip Use to test blood sugars daily. Dx: E11.9 01/06/23   Almira Jaeger, MD  ipratropium (ATROVENT) 0.03 % nasal spray 2 SPRAYS IN EACH NOSTRIL AS NEEDED THREE  TIMES AS NEEDED 03/29/19   [provider]  irbesartan  (AVAPRO ) 300 MG tablet Take 1 tablet (300 mg total) by mouth daily. 09/15/23   Von Grumbling, PA-C  loratadine  (CLARITIN ) 10 MG tablet Take 10 mg by mouth at bedtime.     [provider]  Melatonin 5 MG TABS Take 5 mg by mouth at bedtime as needed (sleep).    [provider]  metFORMIN  (GLUCOPHAGE ) 500 MG tablet Take 500 mg by mouth 2 (two) times daily with a meal.    [provider]  metoCLOPramide  (REGLAN ) 10 MG tablet TAKE 1/2 TABLET BY MOUTH 4 TIMES A DAY 11/14/23   Almira Jaeger, MD  metroNIDAZOLE (METROCREAM) 0.75 % cream     [provider]  Multiple Vitamins-Minerals (MULTIVITAMIN ADULTS) TABS SMARTSIG:1 By Mouth 10/18/21   [provider]  omeprazole  (PRILOSEC) 40 MG capsule Take 1 capsule by mouth daily.    [provider]  oxybutynin (DITROPAN-XL) 5 MG 24 hr tablet  09/10/23   [provider]  Probiotic Product (ALIGN) 4 MG CAPS Take 4 mg by mouth daily.     [provider]  psyllium (METAMUCIL SMOOTH TEXTURE) 28 % packet Take 1 packet by mouth 2 (two) times daily.    [provider]  sennosides-docusate sodium  (SENOKOT-S) 8.6-50 MG tablet Take 2 tablets by mouth daily. 12/24/22   Brown, Blaine K, PA-C  sodium chloride  1 g tablet Take 3 g by mouth 2 (two) times daily with a meal.    [provider]  traMADol  (ULTRAM ) 50 MG tablet TAKE 1 TABLET (50 MG TOTAL) BY MOUTH EVERY 8 (EIGHT) HOURS AS NEEDED FOR MODERATE PAIN (PAIN SCORE 4-6) OR SEVERE PAIN (PAIN SCORE 7-10) (FOR CHRONIC PAIN. DO NOT DRIVE FOR 8 HOURS AFTER TAKING). 11/04/23   Almira Jaeger, MD  venlafaxine  XR (EFFEXOR -XR) 75 MG 24 hr capsule Take 1 capsule (75 mg total) by mouth daily with breakfast. 05/08/23   Almira Jaeger, MD      Allergies    Augmentin  [amoxicillin -pot clavulanate], Erythromycin, and Penicillins    Review of Systems   Review of Systems   Constitutional:  Negative for fever.  HENT:  Negative for congestion.   Eyes:  Negative for blurred vision and pain.  Cardiovascular:  Negative for syncope and near-syncope.  Musculoskeletal:  Negative for back pain, neck pain and neck stiffness.  Neurological:  Positive for headaches. Negative for dizziness, seizures, syncope, facial asymmetry, speech difficulty, weakness, paresthesias and loss of balance.  All other systems reviewed and are negative.   Physical Exam Updated Vital Signs BP (!) 152/130 (BP Location: Right  Arm)   Pulse 88   Temp 98.8 F (37.1 C) (Oral)   Resp 17   Ht 5' 6 (1.676 m)   Wt 90.7 kg   LMP  (LMP Unknown)   SpO2 100%   BMI 32.28 kg/m  Physical Exam Vitals and nursing note reviewed.  Constitutional:      General: She is not in acute distress.    Appearance: She is well-developed.  HENT:     Head: Normocephalic and atraumatic.     Nose: Nose normal.     Mouth/Throat:     Mouth: Mucous membranes are moist.     Pharynx: Oropharynx is clear.  Eyes:     Extraocular Movements: Extraocular movements intact.     Pupils: Pupils are equal, round, and reactive to light.     Comments: Disk sharp   Cardiovascular:     Rate and Rhythm: Normal rate and regular rhythm.     Pulses: Normal pulses.     Heart sounds: Normal heart sounds.  Pulmonary:     Effort: Pulmonary effort is normal. No respiratory distress.     Breath sounds: Normal breath sounds.  Abdominal:     General: Bowel sounds are normal. There is no distension.     Palpations: Abdomen is soft.     Tenderness: There is no abdominal tenderness. There is no guarding or rebound.  Genitourinary:    Vagina: No vaginal discharge.  Musculoskeletal:        General: Normal range of motion.     Cervical back: Normal range of motion and neck supple. No rigidity.  Skin:    General: Skin is warm and dry.     Capillary Refill: Capillary refill takes less than 2 seconds.     Findings: No erythema or  rash.  Neurological:     General: No focal deficit present.     Mental Status: She is oriented to person, place, and time.     Cranial Nerves: No cranial nerve deficit.     Deep Tendon Reflexes: Reflexes normal.  Psychiatric:        Mood and Affect: Mood normal.     ED Results / Procedures / Treatments   Labs (all labs ordered are listed, but only abnormal results are displayed) Labs Reviewed  RESP PANEL BY RT-PCR (RSV, FLU A&B, COVID)  [id number]    EKG None  Radiology CT Head Wo Contrast Result Date: 12/02/2023 CLINICAL DATA:  Headache, nausea, vomiting EXAM: CT HEAD WITHOUT CONTRAST TECHNIQUE: Contiguous axial images were obtained from the base of the skull through the vertex without intravenous contrast. RADIATION DOSE REDUCTION: This exam was performed according to the departmental dose-optimization program which includes automated exposure control, adjustment of the mA and/or kV according to patient size and/or use of iterative reconstruction technique. COMPARISON:  02/01/2021 FINDINGS: Brain: No acute intracranial abnormality. Specifically, no hemorrhage, hydrocephalus, mass lesion, acute infarction, or significant intracranial injury. Vascular: No hyperdense vessel or unexpected calcification. Skull: No acute calvarial abnormality. Sinuses/Orbits: No acute findings Other: None IMPRESSION: No acute intracranial abnormality. Electronically Signed   By: Janeece Mechanic M.D.   On: 12/02/2023 23:16    Procedures Procedures    Medications Ordered in ED Medications  magnesium  sulfate IVPB 2 g 50 mL (0 g Intravenous Stopped 12/03/23 0022)  sodium chloride  0.9 % bolus 500 mL (0 mLs Intravenous Stopped 12/03/23 0116)  metoCLOPramide  (REGLAN ) injection 10 mg (10 mg Intravenous Given 12/02/23 2336)  diphenhydrAMINE (BENADRYL) injection 12.5 mg (12.5 mg Intravenous  Given 12/02/23 2335)  ketorolac  (TORADOL ) 30 MG/ML injection 30 mg (30 mg Intravenous Given 12/03/23 0020)  acetaminophen  (TYLENOL )  tablet 1,000 mg (1,000 mg Oral Given 12/03/23 0104)  divalproex (DEPAKOTE) DR tablet 500 mg (500 mg Oral Given 12/03/23 0120)  dexamethasone  (DECADRON ) injection 4 mg (4 mg Intravenous Given 12/03/23 0104)    ED Course/ Medical Decision Making/ A&P                                 Medical Decision Making Patient with migraines presents with migraine. No fevers no neck pain   Amount and/or Complexity of Data Reviewed Independent Historian: spouse    Details: See above  External Data Reviewed: notes.    Details: Previous notes reviewed  Labs: ordered.    Details: Negative covid and flu  Radiology: ordered and independent interpretation performed.    Details: Negative CT   Risk OTC drugs. Prescription drug management. Risk Details: Patient with headache, improving but she states it usually takes 4 or so hours.  I considered meningitis, but no fever, no neck pain or stiffness, no rash.  I considered ICH but head CT was negative and patient is not ill appearing and there are no atypical features.  I considered cavernous sinus thrombosis, but patient is EOMI, no proptosis, disks are sharp and cognition is intact and there are no atypical features.  Stable for discharge.  Strict returns.      Final Clinical Impression(s) / ED Diagnoses Final diagnoses:  Other migraine without status migrainosus, not intractable    No signs of systemic illness or infection. The patient is nontoxic-appearing on exam and vital signs are within normal limits.  I have reviewed the triage vital signs and the nursing notes. Pertinent labs & imaging results that were available during my care of the patient were reviewed by me and considered in my medical decision making (see chart for details). After history, exam, and medical workup I feel the patient has been appropriately medically screened and is safe for discharge home. Pertinent diagnoses were discussed with the patient. Patient was given return precautions.       Rx / DC Orders ED Discharge Orders     None         Shreya Lacasse, MD 12/03/23 4098

## 2023-12-03 NOTE — ED Notes (Signed)
 Patient pain has improved. Patient has not had anymore emesis. Denies nausea.

## 2023-12-06 ENCOUNTER — Other Ambulatory Visit: Payer: Self-pay | Admitting: Family Medicine

## 2023-12-10 NOTE — Discharge Instructions (Signed)
 Myelogram Discharge Instructions  Go home and rest quietly as needed. You may resume normal activities; however, do not exert yourself strongly or do any heavy lifting today and tomorrow.   DO NOT drive today.    You may resume your normal diet and medications unless otherwise indicated. Drink lots of extra fluids today and tomorrow.   The incidence of headache, nausea, or vomiting is about 5% (one in 20 patients).  If you develop a headache, lie flat for 24 hours and drink plenty of fluids until the headache goes away.  Caffeinated beverages may be helpful. If when you get up you still have a headache when standing, go back to bed and force fluids for another 24 hours.   If you develop severe nausea and vomiting or a headache that does not go away with the flat bedrest after 48 hours, please call [phone].   Call your physician for a follow-up appointment.  The results of your myelogram will be sent directly to your physician by the following day.  If you have any questions or if complications develop after you arrive home, please call [phone].  Discharge instructions have been explained to the patient.  The patient, or the person responsible for the patient, fully understands these instructions.   Thank you for visiting our office today.

## 2023-12-11 ENCOUNTER — Ambulatory Visit
Admission: RE | Admit: 2023-12-11 | Discharge: 2023-12-11 | Disposition: A | Discharge status: Home / Self Care | Source: Ambulatory Visit | Attending: Physical Medicine and Rehabilitation | Admitting: Physical Medicine and Rehabilitation

## 2023-12-11 DIAGNOSIS — M4807 Spinal stenosis, lumbosacral region: Secondary | ICD-10-CM | POA: Diagnosis not present

## 2023-12-11 DIAGNOSIS — M51362 Other intervertebral disc degeneration, lumbar region with discogenic back pain and lower extremity pain: Secondary | ICD-10-CM | POA: Diagnosis not present

## 2023-12-11 DIAGNOSIS — M47816 Spondylosis without myelopathy or radiculopathy, lumbar region: Secondary | ICD-10-CM | POA: Diagnosis not present

## 2023-12-11 DIAGNOSIS — M545 Low back pain, unspecified: Secondary | ICD-10-CM

## 2023-12-11 DIAGNOSIS — M48061 Spinal stenosis, lumbar region without neurogenic claudication: Secondary | ICD-10-CM | POA: Diagnosis not present

## 2023-12-11 MED ORDER — IOPAMIDOL (ISOVUE-M 200) INJECTION 41%
20.0000 mL | Freq: Once | INTRAMUSCULAR | Status: AC
Start: 2023-12-11 — End: 2023-12-11
  Administered 2023-12-11: 20 mL via INTRATHECAL

## 2023-12-11 MED ORDER — MEPERIDINE HCL 50 MG/ML IJ SOLN: 50.0000 mg | Freq: Once | INTRAMUSCULAR | Status: DC | PRN

## 2023-12-11 MED ORDER — ONDANSETRON HCL 4 MG/2ML IJ SOLN: 4.0000 mg | Freq: Once | INTRAMUSCULAR | Status: DC | PRN

## 2023-12-11 MED ORDER — DIAZEPAM 5 MG PO TABS
5.0000 mg | ORAL_TABLET | Freq: Once | ORAL | Status: AC
  Administered 2023-12-11: 5 mg via ORAL

## 2023-12-16 DIAGNOSIS — M47816 Spondylosis without myelopathy or radiculopathy, lumbar region: Secondary | ICD-10-CM | POA: Diagnosis not present

## 2024-01-01 ENCOUNTER — Encounter (HOSPITAL_BASED_OUTPATIENT_CLINIC_OR_DEPARTMENT_OTHER)

## 2024-01-08 ENCOUNTER — Ambulatory Visit (INDEPENDENT_AMBULATORY_CARE_PROVIDER_SITE_OTHER): Admitting: Adult Health

## 2024-01-08 ENCOUNTER — Encounter (INDEPENDENT_AMBULATORY_CARE_PROVIDER_SITE_OTHER): Payer: Self-pay | Admitting: Adult Health

## 2024-01-08 VITALS — BP 128/57 | HR 59 | Temp 99.2°F | Ht 66.0 in | Wt 198.0 lb

## 2024-01-08 DIAGNOSIS — E669 Obesity, unspecified: Secondary | ICD-10-CM | POA: Diagnosis not present

## 2024-01-08 DIAGNOSIS — E1169 Type 2 diabetes mellitus with other specified complication: Secondary | ICD-10-CM | POA: Diagnosis not present

## 2024-01-08 DIAGNOSIS — I10 Essential (primary) hypertension: Secondary | ICD-10-CM

## 2024-01-08 DIAGNOSIS — Z7984 Long term (current) use of oral hypoglycemic drugs: Secondary | ICD-10-CM | POA: Diagnosis not present

## 2024-01-08 DIAGNOSIS — E871 Hypo-osmolality and hyponatremia: Secondary | ICD-10-CM | POA: Diagnosis not present

## 2024-01-08 DIAGNOSIS — E66812 Obesity, class 2: Secondary | ICD-10-CM

## 2024-01-08 DIAGNOSIS — Z6832 Body mass index (BMI) 32.0-32.9, adult: Secondary | ICD-10-CM | POA: Diagnosis not present

## 2024-01-08 DIAGNOSIS — E785 Hyperlipidemia, unspecified: Secondary | ICD-10-CM | POA: Diagnosis not present

## 2024-01-08 DIAGNOSIS — E119 Type 2 diabetes mellitus without complications: Secondary | ICD-10-CM

## 2024-01-08 NOTE — Progress Notes (Signed)
 WEIGHT SUMMARY AND BIOMETRICS  Vitals Temp: 99.2 F (37.3 C) BP: (!) 128/57 Pulse Rate: (!) 59 SpO2: 96 %   Anthropometric Measurements Height: 5' 6 (1.676 m) Weight: 198 lb (89.8 kg) BMI (Calculated): 31.97 Weight at Last Visit: 203 lb Weight Lost Since Last Visit: 5 lb Weight Gained Since Last Visit: 0 Starting Weight: 220 lb Total Weight Loss (lbs): 22 lb (9.979 kg)   Body Composition  Body Fat %: 46.1 % Fat Mass (lbs): 91.6 lbs Muscle Mass (lbs): 101.6 lbs Total Body Water  (lbs): 72.8 lbs Visceral Fat Rating : 14   Other Clinical Data Fasting: no Labs: no Today's Visit #: 58 Starting Date: 01/19/18    Chief Complaint:   OBESITY [name] is here to discuss her progress with her obesity treatment plan.  She is on the the Category 2 Plan and states she is following her eating plan approximately 80-85 % of the time.  She states she is exercising: None  Interim History:  PCP recently added Jardiance  10mg  to her daily Metformin  500mg  BID She denies SE with SGLT-2 and states I can see something that looks good, but I can resist it now. She endorses decreased snacking in between meals.  She is thrilled to have lose 5 lbs!  She and her husband, [name], recently celebrated their 47th wedding anniversary!  Subjective:   1. Type 2 diabetes mellitus without complication, unspecified whether long term insulin  use (HCC) Lab Results  Component Value Date   HGBA1C 6.7 (H) 11/06/2023   HGBA1C 7.0 (H) 05/08/2023   HGBA1C 6.3 10/03/2022    PCP recently added Jardiance  10mg  to her daily Metformin  500mg  BID She denies SE with SGLT-2 and states I can see something that looks good, but I can resist it now. She endorses decreased snacking in between meals. Fasting CBG prior to adding Jardiance  150s Fasting CBG with Metformin  + Jardiance  120s  2. Hyperlipidemia associated with type 2 diabetes mellitus (HCC) Lipid Panel     Component Value Date/Time    CHOL 152 05/08/2023 1149   CHOL 144 06/05/2021 1230   TRIG 92.0 05/08/2023 1149   HDL 60.60 05/08/2023 1149   HDL 64 06/05/2021 1230   CHOLHDL 3 05/08/2023 1149   VLDL 18.4 05/08/2023 1149   LDLCALC 73 05/08/2023 1149   LDLCALC 65 06/05/2021 1230   LDLDIRECT 72.0 11/06/2023 1157   LABVLDL 15 06/05/2021 1230    PCP manages daily Lipitor 40mg   She denies myalgias  3. Primary hypertension BP stable and at goal at OV She denies tobacco/vape use She is currently on atorvastatin  (LIPITOR) 40 MG tablet  amLODipine  (NORVASC ) 10 MG tablet  cloNIDine  (CATAPRES ) 0.1 MG tablet  irbesartan  (AVAPRO ) 300 MG tablet  empagliflozin  (JARDIANCE ) 10 MG TABS tablet  metFORMIN  (GLUCOPHAGE ) 500 MG tablet   4. Hyponatremia She denies nausea, fatigue, dizziness, muscle cramps, headaches,vomiting, lethargy, confusion, and disorientation.  Latest Reference Range & Units 12/19/22 13:00 05/02/23 15:27 11/06/23 11:57  Sodium 135 - 145 mEq/L 129 (L) 137 131 (L)  (L): Data is abnormally low  Assessment/Plan:   1. Type 2 diabetes mellitus without complication, unspecified whether long term insulin  use (HCC) (Primary) Continue healthy eating and increase activity Continue daily Metformin  500mg  BID and Jardiance  10mg   2. Hyperlipidemia associated with type 2 diabetes mellitus (HCC) Continue healthy eating and increase activity Continue statin therapy per PCP  3. Primary hypertension Continue healthy eating and increase activity Continue atorvastatin  (LIPITOR) 40 MG tablet  amLODipine  (NORVASC ) 10  MG tablet  cloNIDine  (CATAPRES ) 0.1 MG tablet  irbesartan  (AVAPRO ) 300 MG tablet  empagliflozin  (JARDIANCE ) 10 MG TABS tablet  metFORMIN  (GLUCOPHAGE ) 500 MG tablet   4. Hyponatremia Monitor for acute sx's Monitor Labs  5. Obesity, CURRENT BMI 32.9  Drea is currently in the action stage of change. As such, her goal is to continue with weight loss efforts. She has agreed to the Category 2 Plan.    Exercise goals: Older adults should follow the adult guidelines. When older adults cannot meet the adult guidelines, they should be as physically active as their abilities and conditions will allow.  Older adults should do exercises that maintain or improve balance if they are at risk of falling.  Older adults should determine their level of effort for physical activity relative to their level of fitness.  Older adults with chronic conditions should understand whether and how their conditions affect their ability to do regular physical activity safely. YouTube Exercises- Seated Exercises  Behavioral modification strategies: increasing lean protein intake, decreasing simple carbohydrates, increasing vegetables, increasing water  intake, no skipping meals, meal planning and cooking strategies, keeping healthy foods in the home, ways to avoid boredom eating, and planning for success.  Bonna has agreed to follow-up with our clinic in 4 weeks. She was informed of the importance of frequent follow-up visits to maximize her success with intensive lifestyle modifications for her multiple health conditions.   Objective:   Blood pressure (!) 128/57, pulse (!) 59, temperature 99.2 F (37.3 C), height 5' 6 (1.676 m), weight 198 lb (89.8 kg), SpO2 96%. Body mass index is 31.96 kg/m.  General: Cooperative, alert, well developed, in no acute distress. HEENT: Conjunctivae and lids unremarkable. Cardiovascular: Regular rhythm.  Lungs: Normal work of breathing. Neurologic: No focal deficits.   Lab Results  Component Value Date   CREATININE 0.56 11/06/2023   BUN 16 11/06/2023   NA 131 (L) 11/06/2023   K 4.2 11/06/2023   CL 96 11/06/2023   CO2 27 11/06/2023   Lab Results  Component Value Date   ALT 19 11/06/2023   AST 26 11/06/2023   ALKPHOS 117 11/06/2023   BILITOT 0.4 11/06/2023   Lab Results  Component Value Date   HGBA1C 6.7 (H) 11/06/2023   HGBA1C 7.0 (H) 05/08/2023   HGBA1C 6.3  10/03/2022   HGBA1C 6.3 (H) 03/05/2022   HGBA1C 6.3 09/24/2021   Lab Results  Component Value Date   INSULIN  6.3 03/05/2022   INSULIN  12.9 06/05/2021   INSULIN  7.7 01/25/2021   INSULIN  13.1 04/20/2020   INSULIN  12.1 01/19/2018   Lab Results  Component Value Date   TSH 4.23 09/17/2021   Lab Results  Component Value Date   CHOL 152 05/08/2023   HDL 60.60 05/08/2023   LDLCALC 73 05/08/2023   LDLDIRECT 72.0 11/06/2023   TRIG 92.0 05/08/2023   CHOLHDL 3 05/08/2023   Lab Results  Component Value Date   VD25OH 49 05/02/2023   VD25OH 62.2 03/05/2022   VD25OH 70.1 06/05/2021   Lab Results  Component Value Date   WBC 7.5 11/06/2023   HGB 11.1 (L) 11/06/2023   HCT 34.5 (L) 11/06/2023   MCV 77.8 (L) 11/06/2023   PLT 391.0 11/06/2023   Lab Results  Component Value Date   IRON 48 06/12/2022   TIBC 450.8 (H) 06/12/2022   FERRITIN 12.0 06/12/2022   Attestation Statements:   Reviewed by clinician on day of visit: allergies, medications, problem list, medical history, surgical history, family history, social history,  and previous encounter notes.  I have reviewed the above documentation for accuracy and completeness, and I agree with the above. -  Sabel Hornbeck d. Dani Danis, NP-C

## 2024-01-15 ENCOUNTER — Encounter (HOSPITAL_BASED_OUTPATIENT_CLINIC_OR_DEPARTMENT_OTHER): Payer: Self-pay | Admitting: Pulmonary Disease

## 2024-01-15 ENCOUNTER — Ambulatory Visit (HOSPITAL_BASED_OUTPATIENT_CLINIC_OR_DEPARTMENT_OTHER): Admitting: Pulmonary Disease

## 2024-01-15 VITALS — BP 144/60 | HR 61 | Ht 66.0 in | Wt 202.7 lb

## 2024-01-15 DIAGNOSIS — G4733 Obstructive sleep apnea (adult) (pediatric): Secondary | ICD-10-CM | POA: Diagnosis not present

## 2024-01-15 DIAGNOSIS — Z9682 Presence of neurostimulator: Secondary | ICD-10-CM

## 2024-01-15 NOTE — Progress Notes (Signed)
 Subjective:    Patient ID: [name], female    DOB: 12/31/1950, 73 y.o.   MRN: [id number]   73 yo for follow-up of OSA   PMH -  hypertension, diabetes, stroke, diastolic dysfunction and aortic valve replacement, SIADH on demeclocycline   She was unable to tolerate CPAP and underwent implantation of hypoglossal nerve stimulator by Dr. Mable on 04/23/22.  07/02/22 Device activated >> Start delay 30 minutes, pause time 15 minutes, duration 12 hours  09/2022 we felt she was overstimulated based on sleep study results and decreased voltage to 1.4 V.  Home sleep test at this level showed high residual AHI.   11/2022 We switched her  to electrode B configuration. sensation 0.5 V , functional level 0.7 V lower limit, upper limit 1.2 V.  She was set at level 2 =0.8 V. there was good tongue protrusion at this level.  We also checked electrode C configuration but there seem to be some retraction rather than protrusion  06/2023 incoming elec B 1.0 V Discussed the use of AI scribe software for clinical note transcription with the patient, who gave verbal consent to proceed.  History of Present Illness [name] is a 73 year old female with sleep apnea who presents for follow-up regarding her CPAP device use. She is accompanied by her husband.  The CPAP device is functioning well. She experiences frequent nocturia, which is likely related to her sleep apnea, but she can return to sleep quickly after using the bathroom. She wakes up with a dry mouth, suggesting she may be sleeping with her mouth open. She denies unintended daytime naps and maintains sufficient energy levels. Although she does not engage in regular physical exercise, she remains active during the day.     Significant tests/ events reviewed    HST watchpat 06/2023 >. pAHI 4% 15.8/h, low sat 83%   HST 10/18/22 on Inspire 1.4 V >> AHI 33/h, low sat 81%  Inspire titration 08/2022 She was at 1.8V at entry. Voltage was  titrated from 1.6V to 2.6V. Hypopneas persisted at the final level of 2.6 V.  Low sat of 80%   Home sleep study July 2017 AHI 19/hr    HST 09/2021 showed moderate sleep apnea with AHI at 16.5/hour and SPO2 low at 77%  Review of Systems  neg for any significant sore throat, dysphagia, itching, sneezing, nasal congestion or excess/ purulent secretions, fever, chills, sweats, unintended wt loss, pleuritic or exertional cp, hempoptysis, orthopnea pnd or change in chronic leg swelling. Also denies presyncope, palpitations, heartburn, abdominal pain, nausea, vomiting, diarrhea or change in bowel or urinary habits, dysuria,hematuria, rash, arthralgias, visual complaints, headache, numbness weakness or ataxia.      Objective:   Physical Exam  Gen. Pleasant, obese, in no distress ENT - no lesions, no post nasal drip Neck: No JVD, no thyromegaly, no carotid bruits Lungs: no use of accessory muscles, no dullness to percussion, decreased without rales or rhonchi  Cardiovascular: Rhythm regular, heart sounds  normal, no murmurs or gallops, no peripheral edema Musculoskeletal: No deformities, no cyanosis or clubbing , no tremors  Programmer- stimulation check at 1.0 V, increased protrusion, ran waveform  x     Assessment & Plan:   Assessment and Plan Assessment & Plan Sleep Apnea Sleep apnea is partially treated with current device settings. Recent sleep study showed about 15 events per hour, which is an improvement but not optimal. Current device setting is at level three. She reports dry  mouth upon waking, which may indicate mouth breathing during sleep. - Increase device setting from level three to level four. - Instruct her to try the new setting for a few nights and report back in one week. - If level four is tolerated, order a home sleep test to assess for reduction in events. - If level four is not tolerated, instruct her to revert to level three and report back. - If level four is  not tolerated, cancel the home sleep test. - Follow up in six months if level four is not tolerated.  Nocturia Nocturia is likely related to sleep apnea. She reports frequent nighttime awakenings to urinate, despite limiting fluid intake after 6 PM. Urologist attributed nocturia to sleep apnea. - Attempt to improve sleep apnea control with device adjustment to potentially reduce nocturia.

## 2024-01-15 NOTE — Patient Instructions (Signed)
  VISIT SUMMARY: You had a follow-up appointment to discuss your sleep apnea and the use of your CPAP device. You mentioned that the device is working well, but you experience frequent nighttime urination and wake up with a dry mouth. You do not take unintended daytime naps and maintain good energy levels throughout the day.  YOUR PLAN: -SLEEP APNEA: Sleep apnea is a condition where your breathing stops and starts repeatedly during sleep. Your current CPAP device setting is helping but not fully optimal. We will increase the device setting from level three to level four. Try this new setting for a few nights and report back in one week. If you tolerate level four, we will order a home sleep test to see if it reduces the number of events. If you do not tolerate level four, revert to level three and let us  know. If level four is not tolerated, we will cancel the home sleep test. Follow up in six months if level four is not tolerated.  -NOCTURIA: Nocturia is the need to wake up and urinate frequently during the night. It is likely related to your sleep apnea. We will try to improve your sleep apnea control with the device adjustment, which may help reduce the nocturia.  INSTRUCTIONS: Please try the new CPAP device setting (level four) for a few nights and report back in one week. If you tolerate the new setting, we will proceed with a home sleep test. If not, revert to level three and inform us . Follow up in six months if level four is not tolerated.                      Contains text generated by [employer]bridge.                                 Contains text generated by Abridge.

## 2024-01-20 ENCOUNTER — Encounter

## 2024-01-20 DIAGNOSIS — G4733 Obstructive sleep apnea (adult) (pediatric): Secondary | ICD-10-CM

## 2024-01-20 DIAGNOSIS — Z9682 Presence of neurostimulator: Secondary | ICD-10-CM

## 2024-01-21 DIAGNOSIS — M533 Sacrococcygeal disorders, not elsewhere classified: Secondary | ICD-10-CM | POA: Diagnosis not present

## 2024-01-25 ENCOUNTER — Encounter (HOSPITAL_BASED_OUTPATIENT_CLINIC_OR_DEPARTMENT_OTHER): Payer: Self-pay | Admitting: Pulmonary Disease

## 2024-01-25 DIAGNOSIS — Z9682 Presence of neurostimulator: Secondary | ICD-10-CM

## 2024-01-25 DIAGNOSIS — G4733 Obstructive sleep apnea (adult) (pediatric): Secondary | ICD-10-CM

## 2024-01-26 NOTE — Telephone Encounter (Signed)
 FYI

## 2024-02-03 DIAGNOSIS — Z1231 Encounter for screening mammogram for malignant neoplasm of breast: Secondary | ICD-10-CM | POA: Diagnosis not present

## 2024-02-03 LAB — HM MAMMOGRAPHY

## 2024-02-05 ENCOUNTER — Encounter: Payer: Self-pay | Admitting: Obstetrics and Gynecology

## 2024-02-05 ENCOUNTER — Ambulatory Visit: Payer: Self-pay | Admitting: Obstetrics and Gynecology

## 2024-02-05 ENCOUNTER — Ambulatory Visit (INDEPENDENT_AMBULATORY_CARE_PROVIDER_SITE_OTHER): Admitting: Adult Health

## 2024-02-05 ENCOUNTER — Encounter (INDEPENDENT_AMBULATORY_CARE_PROVIDER_SITE_OTHER): Payer: Self-pay | Admitting: Adult Health

## 2024-02-05 VITALS — BP 136/73 | HR 67 | Ht 66.0 in | Wt 193.0 lb

## 2024-02-05 DIAGNOSIS — I152 Hypertension secondary to endocrine disorders: Secondary | ICD-10-CM | POA: Diagnosis not present

## 2024-02-05 DIAGNOSIS — Z6831 Body mass index (BMI) 31.0-31.9, adult: Secondary | ICD-10-CM | POA: Diagnosis not present

## 2024-02-05 DIAGNOSIS — E669 Obesity, unspecified: Secondary | ICD-10-CM | POA: Diagnosis not present

## 2024-02-05 DIAGNOSIS — E119 Type 2 diabetes mellitus without complications: Secondary | ICD-10-CM

## 2024-02-05 DIAGNOSIS — Z7984 Long term (current) use of oral hypoglycemic drugs: Secondary | ICD-10-CM | POA: Diagnosis not present

## 2024-02-05 DIAGNOSIS — E559 Vitamin D deficiency, unspecified: Secondary | ICD-10-CM

## 2024-02-05 DIAGNOSIS — G4733 Obstructive sleep apnea (adult) (pediatric): Secondary | ICD-10-CM

## 2024-02-05 DIAGNOSIS — E1159 Type 2 diabetes mellitus with other circulatory complications: Secondary | ICD-10-CM | POA: Diagnosis not present

## 2024-02-05 NOTE — Progress Notes (Signed)
 WEIGHT SUMMARY AND BIOMETRICS  Vitals BP: 136/73 Pulse Rate: 67 SpO2: 100 %   Anthropometric Measurements Height: 5' 6 (1.676 m) Weight: 193 lb (87.5 kg) BMI (Calculated): 31.17 Weight at Last Visit: 198lb Weight Lost Since Last Visit: 5lb Weight Gained Since Last Visit: 0lb Starting Weight: 220lb Total Weight Loss (lbs): 27 lb (12.2 kg)   Body Composition  Body Fat %: 45.6 % Fat Mass (lbs): 88.2 lbs Muscle Mass (lbs): 100 lbs Total Body Water  (lbs): 71.4 lbs Visceral Fat Rating : 13   Other Clinical Data Fasting: No Labs: no Today's Visit #: 70 Starting Date: 01/19/18    Chief Complaint:   OBESITY [name] is here to discuss her progress with her obesity treatment plan.  She is on the the Category 2 Plan and states she is following her eating plan approximately 80 % of the time.  She states she is exercising daily walking 10 minutes 7 times per week.  Interim History:  Antidiabetic Regime managed by PCP/Dr. Katrinka: Metformin  500mg  BID with meals June 2025 PCP added on morning Jardiance  10mg  She is tolerating well and lost 10 lbs since the addition of SGLT2  Current weight 193 lbs with corresponding BMI 31.2  Goal weight is 170 lbs that she hopes to achieve by end of 2025   Subjective:   1. OSA (obstructive sleep apnea) 01/15/2024 Pulmonology OV Notes: 73 yo for follow-up of OSA   PMH -  hypertension, diabetes, stroke, diastolic dysfunction and aortic valve replacement, SIADH on demeclocycline   She was unable to tolerate CPAP and underwent implantation of hypoglossal nerve stimulator by Dr. Mable on 04/23/22.  07/02/22 Device activated >> Start delay 30 minutes, pause time 15 minutes, duration 12 hours  09/2022 we felt she was overstimulated based on sleep study results and decreased voltage to 1.4 V.  Home sleep test at this level showed high residual AHI.   11/2022 We switched her  to electrode B configuration. sensation 0.5 V ,  functional level 0.7 V lower limit, upper limit 1.2 V.  She was set at level 2 =0.8 V. there was good tongue protrusion at this level.  We also checked electrode C configuration but there seem to be some retraction rather than protrusion   06/2023 incoming elec B 1.0 V Discussed the use of AI scribe software for clinical note transcription with the patient, who gave verbal consent to proceed.   History of Present Illness [name] is a 73 year old female with sleep apnea who presents for follow-up regarding her CPAP device use. She is accompanied by her husband.   The CPAP device is functioning well. She experiences frequent nocturia, which is likely related to her sleep apnea, but she can return to sleep quickly after using the bathroom. She wakes up with a dry mouth, suggesting she may be sleeping with her mouth open. She denies unintended daytime naps and maintains sufficient energy levels. Although she does not engage in regular physical exercise, she remains active during the day.  2. Hypertension associated with diabetes (HCC) BP stable at OV atorvastatin  (LIPITOR) 40 MG tablet  amLODipine  (NORVASC ) 10 MG tablet  cloNIDine  (CATAPRES ) 0.1 MG tablet  irbesartan  (AVAPRO ) 300 MG tablet  empagliflozin  (JARDIANCE ) 10 MG TABS tablet  metFORMIN  (GLUCOPHAGE ) 500 MG tablet   3. Vitamin D  deficiency Current daily oral supplementation: Oscal with D VitD3 5000 international units OTC MVI   4. Type 2 diabetes mellitus without complication, unspecified whether long term  insulin  use (HCC) PCP manages Metformin  500mg  BID and recently added on morning Jardiance  10mg  in June 2025 Her fasting CBG prior to Jardiance  were 130/140s In last month, fasting CBG 120/130s She denies   Assessment/Plan:   1. OSA (obstructive sleep apnea) (Primary) Continue healthy eating, daily walking, and add in strength training F/u with Pulm as directed/PRN Continue with weight loss efforts  2.  Hypertension associated with diabetes (HCC) Continue healthy eating, daily walking, and add in strength training Continue current Regime atorvastatin  (LIPITOR) 40 MG tablet  amLODipine  (NORVASC ) 10 MG tablet  cloNIDine  (CATAPRES ) 0.1 MG tablet  irbesartan  (AVAPRO ) 300 MG tablet  empagliflozin  (JARDIANCE ) 10 MG TABS tablet  metFORMIN  (GLUCOPHAGE ) 500 MG tablet   3. Vitamin D  deficiency Continue current OTC supplementation Check Labs Q2-2M  4. Type 2 diabetes mellitus without complication, unspecified whether long term insulin  use (HCC) Continue healthy eating, daily walking, and add in strength training  5. Obesity, CURRENT BMI 31.2  [name] is currently in the action stage of change. As such, her goal is to continue with weight loss efforts. She has agreed to the Category 2 Plan.   Exercise goals: Older adults should follow the adult guidelines. When older adults cannot meet the adult guidelines, they should be as physically active as their abilities and conditions will allow.  Older adults should do exercises that maintain or improve balance if they are at risk of falling.  Older adults should determine their level of effort for physical activity relative to their level of fitness.  Older adults with chronic conditions should understand whether and how their conditions affect their ability to do regular physical activity safely. Add in Banded Exercises at least twice weekly  Behavioral modification strategies: increasing lean protein intake, decreasing simple carbohydrates, increasing vegetables, increasing water  intake, meal planning and cooking strategies, keeping healthy foods in the home, ways to avoid boredom eating, and planning for success.  [name] has agreed to follow-up with our clinic in 4 weeks. She was informed of the importance of frequent follow-up visits to maximize her success with intensive lifestyle modifications for her multiple health conditions.   Objective:    Blood pressure 136/73, pulse 67, height 5' 6 (1.676 m), weight 193 lb (87.5 kg), SpO2 100%. Body mass index is 31.15 kg/m.  General: Cooperative, alert, well developed, in no acute distress. HEENT: Conjunctivae and lids unremarkable. Cardiovascular: Regular rhythm.  Lungs: Normal work of breathing. Neurologic: No focal deficits.   Lab Results  Component Value Date   CREATININE 0.56 11/06/2023   BUN 16 11/06/2023   NA 131 (L) 11/06/2023   K 4.2 11/06/2023   CL 96 11/06/2023   CO2 27 11/06/2023   Lab Results  Component Value Date   ALT 19 11/06/2023   AST 26 11/06/2023   ALKPHOS 117 11/06/2023   BILITOT 0.4 11/06/2023   Lab Results  Component Value Date   HGBA1C 6.7 (H) 11/06/2023   HGBA1C 7.0 (H) 05/08/2023   HGBA1C 6.3 10/03/2022   HGBA1C 6.3 (H) 03/05/2022   HGBA1C 6.3 09/24/2021   Lab Results  Component Value Date   INSULIN  6.3 03/05/2022   INSULIN  12.9 06/05/2021   INSULIN  7.7 01/25/2021   INSULIN  13.1 04/20/2020   INSULIN  12.1 01/19/2018   Lab Results  Component Value Date   TSH 4.23 09/17/2021   Lab Results  Component Value Date   CHOL 152 05/08/2023   HDL 60.60 05/08/2023   LDLCALC 73 05/08/2023   LDLDIRECT 72.0 11/06/2023  TRIG 92.0 05/08/2023   CHOLHDL 3 05/08/2023   Lab Results  Component Value Date   VD25OH 49 05/02/2023   VD25OH 62.2 03/05/2022   VD25OH 70.1 06/05/2021   Lab Results  Component Value Date   WBC 7.5 11/06/2023   HGB 11.1 (L) 11/06/2023   HCT 34.5 (L) 11/06/2023   MCV 77.8 (L) 11/06/2023   PLT 391.0 11/06/2023   Lab Results  Component Value Date   IRON 48 06/12/2022   TIBC 450.8 (H) 06/12/2022   FERRITIN 12.0 06/12/2022   Attestation Statements:   Reviewed by clinician on day of visit: allergies, medications, problem list, medical history, surgical history, family history, social history, and previous encounter notes.  I have reviewed the above documentation for accuracy and completeness, and I agree with  the above. -  Tyann Niehaus d. Rosia Syme, NP-C

## 2024-02-08 DIAGNOSIS — R0683 Snoring: Secondary | ICD-10-CM | POA: Diagnosis not present

## 2024-02-08 NOTE — Telephone Encounter (Signed)
 Good to know We will proceed with checking HST on this level (watchpat or alice ok) If watchpat - dr milagros to read

## 2024-02-09 NOTE — Telephone Encounter (Signed)
 I will reach out to Mrs. Whitener to inform her of the update. She will complete an HST prior to her six month follow-up in January. I advised her to reach out with any questions or concerns.  Please let me know if anything further is needed.

## 2024-02-17 ENCOUNTER — Encounter: Payer: Self-pay | Admitting: Internal Medicine

## 2024-02-20 ENCOUNTER — Encounter: Payer: Self-pay | Admitting: Family Medicine

## 2024-03-02 ENCOUNTER — Encounter: Payer: Self-pay | Admitting: Family Medicine

## 2024-03-02 DIAGNOSIS — Z961 Presence of intraocular lens: Secondary | ICD-10-CM | POA: Diagnosis not present

## 2024-03-02 DIAGNOSIS — H26491 Other secondary cataract, right eye: Secondary | ICD-10-CM | POA: Diagnosis not present

## 2024-03-02 DIAGNOSIS — H5201 Hypermetropia, right eye: Secondary | ICD-10-CM | POA: Diagnosis not present

## 2024-03-02 DIAGNOSIS — E119 Type 2 diabetes mellitus without complications: Secondary | ICD-10-CM | POA: Diagnosis not present

## 2024-03-02 DIAGNOSIS — H43813 Vitreous degeneration, bilateral: Secondary | ICD-10-CM | POA: Diagnosis not present

## 2024-03-02 DIAGNOSIS — H5212 Myopia, left eye: Secondary | ICD-10-CM | POA: Diagnosis not present

## 2024-03-02 LAB — HM DIABETES EYE EXAM

## 2024-03-03 ENCOUNTER — Encounter: Payer: Self-pay | Admitting: Family Medicine

## 2024-03-05 ENCOUNTER — Other Ambulatory Visit: Payer: Self-pay | Admitting: Family Medicine

## 2024-03-07 ENCOUNTER — Other Ambulatory Visit: Payer: Self-pay | Admitting: Family Medicine

## 2024-03-11 ENCOUNTER — Ambulatory Visit (INDEPENDENT_AMBULATORY_CARE_PROVIDER_SITE_OTHER): Admitting: Nurse Practitioner

## 2024-03-11 ENCOUNTER — Encounter (INDEPENDENT_AMBULATORY_CARE_PROVIDER_SITE_OTHER): Payer: Self-pay | Admitting: Nurse Practitioner

## 2024-03-11 ENCOUNTER — Ambulatory Visit (INDEPENDENT_AMBULATORY_CARE_PROVIDER_SITE_OTHER): Admitting: Adult Health

## 2024-03-11 VITALS — BP 128/45 | HR 64 | Temp 99.6°F | Ht 66.0 in | Wt 188.0 lb

## 2024-03-11 DIAGNOSIS — E1169 Type 2 diabetes mellitus with other specified complication: Secondary | ICD-10-CM

## 2024-03-11 DIAGNOSIS — I152 Hypertension secondary to endocrine disorders: Secondary | ICD-10-CM | POA: Diagnosis not present

## 2024-03-11 DIAGNOSIS — G4733 Obstructive sleep apnea (adult) (pediatric): Secondary | ICD-10-CM

## 2024-03-11 DIAGNOSIS — Z6830 Body mass index (BMI) 30.0-30.9, adult: Secondary | ICD-10-CM

## 2024-03-11 DIAGNOSIS — E66811 Obesity, class 1: Secondary | ICD-10-CM | POA: Diagnosis not present

## 2024-03-11 DIAGNOSIS — E559 Vitamin D deficiency, unspecified: Secondary | ICD-10-CM

## 2024-03-11 DIAGNOSIS — E785 Hyperlipidemia, unspecified: Secondary | ICD-10-CM | POA: Diagnosis not present

## 2024-03-11 DIAGNOSIS — Z7984 Long term (current) use of oral hypoglycemic drugs: Secondary | ICD-10-CM

## 2024-03-11 DIAGNOSIS — E1159 Type 2 diabetes mellitus with other circulatory complications: Secondary | ICD-10-CM

## 2024-03-11 NOTE — Progress Notes (Signed)
 Office: [phone]  /  Fax: [phone]  WEIGHT SUMMARY AND BIOMETRICS  Weight Lost Since Last Visit: 5lb  Weight Gained Since Last Visit: 0lb   Vitals Temp: 99.6 F (37.6 C) BP: (!) 128/45 Pulse Rate: 64 SpO2: 100 %   Anthropometric Measurements Height: 5' 6 (1.676 m) Weight: 188 lb (85.3 kg) BMI (Calculated): 30.36 Weight at Last Visit: 193lb Weight Lost Since Last Visit: 5lb Weight Gained Since Last Visit: 0lb Starting Weight: 220lb Total Weight Loss (lbs): 32 lb (14.5 kg)   Body Composition  Body Fat %: 45.3 % Fat Mass (lbs): 85.6 lbs Muscle Mass (lbs): 98 lbs Total Body Water  (lbs): 70.8 lbs Visceral Fat Rating : 13   Other Clinical Data Fasting: No Labs: No Today's Visit #: 86 Starting Date: 01/19/18    Total Weight Loss: 32 pounds Percent of body weight lost: 14.5 % Bio Impedence Data: Muscle is down 2 pounds and adipose is down 2.6 pounds  HPI  Chief Complaint: OBESITY  [name] is here to discuss her progress with her obesity treatment plan. She is on the the Category 2 Plan and states she is following her eating plan approximately 80 % of the time. She states she is exercising 10-15 minutes 3 days per week.   Interval History:  Since last office visit she has been following category 2 meal plan about 80% of the time. She is having difficulty with walking due to pain in lower back and left hip.  She is due to have a steroid injection at the end of this month. She does use Tramadol50 mg q 8 hours and Tylenol  as needed for pain.   Bp is well controlled on Irbesartan  300 mg every day, Amlodipine  10 mg every day and clonidine  0.1 mg at bedtime.  BP Readings from Last 3 Encounters:  03/11/24 (!) 128/45  02/05/24 136/73  01/15/24 (!) 144/60   She has inspire device and has been working well, recently adjusted. She sees Dr. Jude. Sleeping well at this point.   Blood sugars are running 115-130 on Jardiance  10 mg every day and Metformin  500 mg  1 tab BID, focusing on weight loss.  She is following plan well, controlling snacking better.  She is drinking water  approximately 60 ounces a day but does have hyponatremia so needs to be careful with fluid intake. She is trying to get 90 grams of protein a day.   She continues on Atorvastatin  40 mg every day for hyperlipidemia- no side effects  Has physical scheduled in November  PHYSICAL EXAM:  Blood pressure (!) 128/45, pulse 64, temperature 99.6 F (37.6 C), height 5' 6 (1.676 m), weight 188 lb (85.3 kg), SpO2 100%. Body mass index is 30.34 kg/m.  General: Well Developed, well nourished, and in no acute distress.  HEENT: Normocephalic, atraumatic; EOMI, sclerae are anicteric. Skin: Warm and dry, good turgor Chest:  Normal excursion, shape, no gross ABN Respiratory: No conversational dyspnea; speaking in full sentences NeuroM-Sk:  Normal gross ROM * 4 extremities  Psych: A and O X 3, insight adequate, mood- full    DIAGNOSTIC DATA REVIEWED:  BMET    Component Value Date/Time   NA 131 (L) 11/06/2023 1157   NA 135 06/05/2021 1230   K 4.2 11/06/2023 1157   CL 96 11/06/2023 1157   CO2 27 11/06/2023 1157   GLUCOSE 192 (H) 11/06/2023 1157   BUN 16 11/06/2023 1157   BUN 16 06/05/2021 1230   CREATININE 0.56 11/06/2023 1157   CREATININE 0.68  05/02/2023 1527   CALCIUM  9.0 11/06/2023 1157   GFRNONAA >60 12/19/2022 1300   GFRNONAA 94 06/09/2020 1601   GFRAA 109 06/09/2020 1601   Lab Results  Component Value Date   HGBA1C 6.7 (H) 11/06/2023   HGBA1C 6.3 10/05/2008   Lab Results  Component Value Date   INSULIN  6.3 03/05/2022   INSULIN  12.1 01/19/2018   Lab Results  Component Value Date   TSH 4.23 09/17/2021   CBC    Component Value Date/Time   WBC 7.5 11/06/2023 1157   RBC 4.44 11/06/2023 1157   HGB 11.1 (L) 11/06/2023 1157   HGB 11.7 01/19/2018 0959   HCT 34.5 (L) 11/06/2023 1157   HCT 35.7 01/19/2018 0959   PLT 391.0 11/06/2023 1157   PLT 358 04/24/2017  1231   MCV 77.8 (L) 11/06/2023 1157   MCV 81 01/19/2018 0959   MCH 27.3 02/09/2021 1332   MCHC 32.2 11/06/2023 1157   RDW 15.7 (H) 11/06/2023 1157   RDW 14.9 01/19/2018 0959   Iron Studies    Component Value Date/Time   IRON 48 06/12/2022 1427   TIBC 450.8 (H) 06/12/2022 1427   FERRITIN 12.0 06/12/2022 1427   IRONPCTSAT 10.6 (L) 06/12/2022 1427   Lipid Panel     Component Value Date/Time   CHOL 152 05/08/2023 1149   CHOL 144 06/05/2021 1230   TRIG 92.0 05/08/2023 1149   HDL 60.60 05/08/2023 1149   HDL 64 06/05/2021 1230   CHOLHDL 3 05/08/2023 1149   VLDL 18.4 05/08/2023 1149   LDLCALC 73 05/08/2023 1149   LDLCALC 65 06/05/2021 1230   LDLDIRECT 72.0 11/06/2023 1157   Hepatic Function Panel     Component Value Date/Time   PROT 6.9 11/06/2023 1157   PROT 6.5 06/05/2021 1230   ALBUMIN  4.4 11/06/2023 1157   ALBUMIN  4.7 06/05/2021 1230   AST 26 11/06/2023 1157   ALT 19 11/06/2023 1157   ALKPHOS 117 11/06/2023 1157   BILITOT 0.4 11/06/2023 1157   BILITOT 0.4 06/05/2021 1230   BILIDIR 0.1 08/15/2020 1533      Component Value Date/Time   TSH 4.23 09/17/2021 1623   Nutritional Lab Results  Component Value Date   VD25OH 49 05/02/2023   VD25OH 62.2 03/05/2022   VD25OH 70.1 06/05/2021     ASSESSMENT AND PLAN  Class 1 obesity with serious comorbidity and body mass index (BMI) of 30.0 to 30.9 in adult, unspecified obesity type TREATMENT PLAN FOR OBESITY:  Recommended Dietary Goals  [name] is currently in the action stage of change. As such, her goal is to continue weight management plan. She has agreed to the Category 2 Plan.  Behavioral Intervention  We discussed the following Behavioral Modification Strategies today: continue to work on maintaining a reduced calorie state, getting the recommended amount of protein, incorporating whole foods, making healthy choices, staying well hydrated and practicing mindfulness when eating. and increase protein intake,  fibrous foods (25 grams per day for women, 30 grams for men) and water  to improve satiety and decrease hunger signals. .Encourage at least 80 grams of protein daily  Additional resources provided today: NA  Recommended Physical Activity Goals  [name] has been advised to work up to 150 minutes of moderate intensity aerobic activity a week and strengthening exercises 2-3 times per week for cardiovascular health, weight loss maintenance and preservation of muscle mass.   She has agreed to Think about enjoyable ways to increase daily physical activity and overcoming barriers to exercise and Increase physical  activity in their day and reduce sedentary time (increase NEAT).   Pharmacotherapy We discussed various medication options to help Marionna with her weight loss efforts and we both agreed to continue Metformin  and Jardiance  for type 2 DM and off label for weight loss.  ASSOCIATED CONDITIONS ADDRESSED TODAY  Action/Plan  Hyperlipidemia associated with type 2 diabetes mellitus (HCC) Continue Atorvastatin  40 mg every day Continue Category 2 meal plan and limiting saturated fats Focus on nutrition and weight loss  Type 2 diabetes mellitus with other specified complication, without long-term current use of insulin  (HCC) Type 2 diabetes mellitus with obesity (HCC) Continue Category 2 meal plan and focus on limiting simple carbohydrates Continue Jardiance  10 mg every day and Metformin  500 mg BID Increase activity as tolerated with hip/low back pain  Hypertension associated with diabetes (HCC) BP is stable on Amlodipine  10 mg every day, clonidine  0.1 mg at bedtime and irbesartan  300 mg every day Continue to monitor BP and if consistently > 140/90 notify PCP Change position slowly to avoid orthostatic hypotension Continue DASH diet and category 2 meal plan  OSA (obstructive sleep apnea) Has inspire device and doing well Continue to follow with pulmonology  Vitamin D   deficiency Continue Vit D3 5000 units daily Has physical 04/2024 if not checked at that time will recheck     Return in about 4 weeks (around 04/08/2024).[name] She was informed of the importance of frequent follow up visits to maximize her success with intensive lifestyle modifications for her multiple health conditions.   ATTESTASTION STATEMENTS:  Reviewed by clinician on day of visit: allergies, medications, problem list, medical history, surgical history, family history, social history, and previous encounter notes.   I personally spent a total of 31 minutes in the care of the patient today including preparing to see the patient, getting/reviewing separately obtained history, performing a medically appropriate exam/evaluation, counseling and educating, documenting clinical information in the EHR, independently interpreting results, and communicating results.   Colbi Staubs ANP-C

## 2024-03-11 NOTE — Addendum Note (Signed)
 Addended by: JUDE BRUNET E on: 03/11/2024 04:27 PM   Modules accepted: Level of Service

## 2024-03-18 ENCOUNTER — Encounter: Payer: Self-pay | Admitting: Family Medicine

## 2024-03-22 DIAGNOSIS — M5416 Radiculopathy, lumbar region: Secondary | ICD-10-CM | POA: Diagnosis not present

## 2024-03-30 ENCOUNTER — Ambulatory Visit: Admitting: Obstetrics and Gynecology

## 2024-03-30 ENCOUNTER — Encounter: Payer: Self-pay | Admitting: Obstetrics and Gynecology

## 2024-03-30 VITALS — BP 126/84 | HR 86 | Ht 66.25 in | Wt 188.0 lb

## 2024-03-30 DIAGNOSIS — Z01419 Encounter for gynecological examination (general) (routine) without abnormal findings: Secondary | ICD-10-CM

## 2024-03-30 DIAGNOSIS — N9089 Other specified noninflammatory disorders of vulva and perineum: Secondary | ICD-10-CM | POA: Diagnosis not present

## 2024-03-30 DIAGNOSIS — Z9882 Breast implant status: Secondary | ICD-10-CM | POA: Diagnosis not present

## 2024-03-30 DIAGNOSIS — Z9189 Other specified personal risk factors, not elsewhere classified: Secondary | ICD-10-CM

## 2024-03-30 NOTE — Patient Instructions (Signed)

## 2024-03-30 NOTE — Progress Notes (Signed)
 73 y.o. G0P0000 Married Caucasian female here for a breast and pelvic exam. Has spot on inside of leg she would like looked at and would like a referral for someone to check Hemorrhoids.  Has appt with plastic surgeon 03/31/24 for breast implant.   She had a mammogram showing possible right implant rupture.    Has a thigh lesion for 6 months.  Has constipation and is taking Miralax.   Sees Dr. Abran in November for her visit.    Some LLQ discomfort.    Had a Monarch midurethral sling many years ago, and she does not have urinary incontinence.   Gets up at night to void 3 - 4 times.  No dysuria.   Able to sleep again.   PCP: Katrinka Garnette KIDD, MD   No LMP recorded (lmp unknown). Patient has had a hysterectomy.           Sexually active: No  The current method of family planning is status post hysterectomy.    Menopausal hormone therapy:  n/a Exercising: No.  Back Injury  Smoker:  no  OB History     Gravida  0   Para  0   Term  0   Preterm  0   AB  0   Living  0      SAB  0   IAB  0   Ectopic  0   Multiple  0   Live Births  0           HEALTH MAINTENANCE: Last 2 paps: 02/10/08 normal  History of abnormal Pap or positive HPV:  no Mammogram:  02/03/24 Breast Density Cat A, BIRADS Cat 2 benign  Colonoscopy:  07/28/14 normal due every 10 years  Bone Density:  04/12/22  Result  low bone mass    Immunization History  Administered Date(s) Administered   Fluad Quad(high Dose 65+) 03/10/2022   INFLUENZA, HIGH DOSE SEASONAL PF 01/31/2019, 02/27/2020, 02/28/2020, 03/27/2021, 03/10/2022, 04/16/2023   Influenza Split 04/04/2011, 03/05/2012   Influenza Whole 04/16/2007, 03/16/2009, 03/13/2010   Influenza,inj,Quad PF,6+ Mos 03/30/2013, 03/10/2014, 03/23/2015, 02/27/2016   Influenza-Unspecified 02/27/2017, 02/28/2017, 01/31/2018   PFIZER Comirnaty(Gray Top)Covid-19 Tri-Sucrose Vaccine 11/17/2020   PFIZER(Purple Top)SARS-COV-2 Vaccination 08/15/2019, 09/08/2019,  03/31/2020   Pneumococcal Conjugate-13 05/21/2016   Pneumococcal Polysaccharide-23 05/23/2017, 03/27/2021   Td 06/24/1996, 08/06/2007, 11/07/2017   Tdap 11/07/2017   Zoster Recombinant(Shingrix) 01/21/2019, 03/24/2019   Zoster, Live 05/12/2014      reports that she has never smoked. She has never used smokeless tobacco. She reports that she does not drink alcohol and does not use drugs.  Past Medical History:  Diagnosis Date   Allergy    Anxiety    Arthritis    back & knee   Asthma     mild per pt shows up with resp illness   Colon polyps    Constipation    Diabetes mellitus without complication (HCC)    Endometriosis    Gallstones    Gastric polyps    Gastroparesis    GERD (gastroesophageal reflux disease)    Headache    sinus headaches and migraines at times   Heart murmur    History of migraine headaches    HTN (hypertension)    Hyperlipidemia    IBS (irritable bowel syndrome)    Joint pain    Lumbar disc disease    PONV (postoperative nausea and vomiting)    S/P aortic valve replacement with bioprosthetic valve 08/23/2016   25 mm Celestia  Intuity rapid-deployment bovine pericardial tissue valve via partial upper mini sternotomy   Sleep apnea    does not use CPAP   TIA (transient ischemic attack)    TIA (transient ischemic attack)    hx of per pt    Past Surgical History:  Procedure Laterality Date   ABDOMINAL HYSTERECTOMY     AORTIC VALVE REPLACEMENT N/A 08/23/2016   Procedure: AORTIC VALVE REPLACEMENT (AVR) - using partial Upper Sternotomy- 25mm Edwards Intuity Aortic Valve used;  Surgeon: Sudie VEAR Laine, MD;  Location: [facility];  Service: Open Heart Surgery;  Laterality: N/A;   BLADDER SUSPENSION  2007   [facility]arch sling   BREAST ENHANCEMENT SURGERY  1980   CARPAL TUNNEL RELEASE Bilateral    COLONOSCOPY     DORSAL COMPARTMENT RELEASE Right 09/15/2014   Procedure: RIGHT WRIST DEQUERVAINS RELEASE ;  Surgeon: Toribio Chancy, MD;  Location: [facility];  Service: Orthopedics;  Laterality: Right;   DRUG INDUCED ENDOSCOPY N/A 02/19/2022   Procedure: DRUG INDUCED SLEEP ENDOSCOPY;  Surgeon: Mable Lenis, MD;  Location: [facility];  Service: ENT;  Laterality: N/A;   ESOPHAGOGASTRODUODENOSCOPY     EYE SURGERY     cataract surgery   IMPLANTATION OF HYPOGLOSSAL NERVE STIMULATOR Right 04/23/2022   Procedure: IMPLANTATION OF HYPOGLOSSAL NERVE STIMULATOR;  Surgeon: Mable Lenis, MD;  Location: [facility];  Service: ENT;  Laterality: Right;   KNEE ARTHROSCOPY  04/15/2012   Procedure: ARTHROSCOPY KNEE;  Surgeon: Toribio JULIANNA Chancy, MD;  [facility];  Service: Orthopedics;  Laterality: Right;   LAPAROSCOPIC CHOLECYSTECTOMY     LASIK     OOPHORECTOMY     PALATE TO GINGIVA GRAFT  2017   REVERSE SHOULDER ARTHROPLASTY Left 02/13/2021   Procedure: REVERSE SHOULDER ARTHROPLASTY;  Surgeon: Josefina Chew, MD;  Location: [facility];  Service: Orthopedics;  Laterality: Left;   RIGHT/LEFT HEART CATH AND CORONARY ANGIOGRAPHY N/A 08/02/2016   Procedure: Right/Left Heart Cath and Coronary Angiography;  Surgeon: Ozell Fell, MD;  Location: [facility] INVASIVE CV LAB;  Service: Cardiovascular;  Laterality: N/A;   ROTATOR CUFF REPAIR Left    SHOULDER ARTHROSCOPY WITH ROTATOR CUFF REPAIR AND SUBACROMIAL DECOMPRESSION Right 12/24/2022   Procedure: SHOULDER ARTHROSCOPY WITH DEBRIDEMENT, ROTATOR CUFF REPAIR AND SUBACROMIAL DECOMPRESSION;  Surgeon: Josefina Chew, MD;  Location: [facility];  Service: Orthopedics;  Laterality: Right;   TEE WITHOUT CARDIOVERSION N/A 08/23/2016   Procedure: TRANSESOPHAGEAL ECHOCARDIOGRAM (TEE);  Surgeon: Sudie VEAR Laine, MD;  Location: [facility] OR;  Service: Open Heart Surgery;  Laterality: N/A;   TOTAL KNEE ARTHROPLASTY  04/15/2012   Procedure: TOTAL KNEE ARTHROPLASTY;  Surgeon: Toribio JULIANNA Chancy, MD;  Location: [facility] OR;  Service: Orthopedics;  Laterality: Right;   TRIGGER FINGER RELEASE Right  04/20/2015   Procedure: RIGHT TRIGGER FINGER RELEASE (TENDON SHEALTH INCISION) ;  Surgeon: Toribio JULIANNA Chancy, MD;  Location: [facility];  Service: Orthopedics;  Laterality: Right;    Current Outpatient Medications  Medication Sig Dispense Refill   Accu-Chek Softclix Lancets lancets Use as instructed 100 each 12   amLODipine  (NORVASC ) 10 MG tablet Take 1 tablet (10 mg total) by mouth daily. 90 tablet 3   atorvastatin  (LIPITOR) 40 MG tablet TAKE 1 TABLET BY MOUTH DAILY AT 6 PM. 90 tablet 3   Biotin 5 MG TBDP SMARTSIG:1 By Mouth     blood glucose meter kit and supplies KIT Dispense based on patient and insurance preference. Use up to four times daily as  directed. Dx: E11.9 1 each 0   calcium -vitamin D  (OSCAL WITH D) 500-200 MG-UNIT per tablet Take 2 tablets by mouth daily.      clindamycin  (CLEOCIN ) 300 MG capsule TAKE 2 CAPSULES BY MOUTH 1 HOUR PRIOR TO DENTAL CLEANING. 6 capsule 2   cloNIDine  (CATAPRES ) 0.1 MG tablet Take 1 tablet (0.1 mg total) by mouth at bedtime. 90 tablet 3   clopidogrel  (PLAVIX ) 75 MG tablet TAKE 1 TABLET BY MOUTH EVERY DAY 90 tablet 3   D-5000 125 MCG (5000 UT) TABS SMARTSIG:1 Tablet(s) By Mouth     demeclocycline (DECLOMYCIN) 150 MG tablet Take 1 tablet (150 mg total) by mouth 2 (two) times daily. 180 tablet 3   empagliflozin  (JARDIANCE ) 10 MG TABS tablet Take 1 tablet (10 mg total) by mouth daily. 30 tablet 11   fluticasone  (FLONASE ) 50 MCG/ACT nasal spray Place 2 sprays into both nostrils daily.     glucose blood (ACCU-CHEK GUIDE) test strip Use to test blood sugars daily. Dx: E11.9 100 strip 12   ipratropium (ATROVENT) 0.03 % nasal spray 2 SPRAYS IN EACH NOSTRIL AS NEEDED THREE TIMES AS NEEDED     irbesartan  (AVAPRO ) 300 MG tablet Take 1 tablet (300 mg total) by mouth daily. 90 tablet 3   loratadine  (CLARITIN ) 10 MG tablet Take 10 mg by mouth at bedtime.      Melatonin 5 MG TABS Take 5 mg by mouth at bedtime as needed (sleep).     metFORMIN  (GLUCOPHAGE )  500 MG tablet TAKE 1 TABLET BY MOUTH WITH BREAKFAST AND 1/2 TO 1 TABLET WITH DINNER 180 tablet 1   metoCLOPramide  (REGLAN ) 10 MG tablet TAKE 1/2 TABLET BY MOUTH 4 TIMES A DAY 60 tablet 3   metroNIDAZOLE (METROCREAM) 0.75 % cream      Multiple Vitamins-Minerals (MULTIVITAMIN ADULTS) TABS SMARTSIG:1 By Mouth     omeprazole  (PRILOSEC) 40 MG capsule Take 1 capsule by mouth daily.     Probiotic Product (ALIGN) 4 MG CAPS Take 4 mg by mouth daily.      psyllium (METAMUCIL SMOOTH TEXTURE) 28 % packet Take 1 packet by mouth 2 (two) times daily.     sennosides-docusate sodium  (SENOKOT-S) 8.6-50 MG tablet Take 2 tablets by mouth daily. 30 tablet 1   sodium chloride  1 g tablet Take 3 g by mouth 2 (two) times daily with a meal.     traMADol  (ULTRAM ) 50 MG tablet TAKE 1 TABLET (50 MG TOTAL) BY MOUTH EVERY 8 (EIGHT) HOURS AS NEEDED FOR MODERATE PAIN (PAIN SCORE 4-6) OR SEVERE PAIN (PAIN SCORE 7-10) (FOR CHRONIC PAIN. DO NOT DRIVE FOR 8 HOURS AFTER TAKING). 60 tablet 5   venlafaxine  XR (EFFEXOR -XR) 75 MG 24 hr capsule Take 1 capsule (75 mg total) by mouth daily with breakfast. 90 capsule 3   No current facility-administered medications for this visit.    ALLERGIES: Augmentin  [amoxicillin -pot clavulanate], Erythromycin, and Penicillins  Family History  Problem Relation Age of Onset   COPD Mother    Colon polyps Mother    Irritable bowel syndrome Mother    Anxiety disorder Mother    Heart disease Father    Alcohol abuse Father    Breast cancer Maternal Grandmother    Gallbladder disease Maternal Grandmother    Colon polyps Maternal Aunt    Diabetes Paternal Uncle        several uncles   Other Neg Hx        hyponatremia   Colon cancer Neg Hx    Esophageal  cancer Neg Hx    Rectal cancer Neg Hx    Stomach cancer Neg Hx     Review of Systems  All other systems reviewed and are negative.   PHYSICAL EXAM:  BP 126/84 (BP Location: Left Arm, Patient Position: Sitting)   Pulse 86   Ht 5' 6.25  (1.683 m)   Wt 188 lb (85.3 kg)   LMP  (LMP Unknown)   SpO2 99%   BMI 30.12 kg/m     General appearance: alert, cooperative and appears stated age Head: normocephalic, without obvious abnormality, atraumatic Neck: no adenopathy, supple, symmetrical, trachea midline and thyroid  normal to inspection and palpation Lungs: clear to auscultation bilaterally Breasts: bilateral implants, firmness noted right subareolar region greater than left subareolar region, no specific masses or tenderness, No nipple retraction or dimpling, No nipple discharge or bleeding, No axillary adenopathy Heart: regular rate and rhythm Abdomen: soft, non-tender; no masses, no organomegaly Extremities: extremities normal, atraumatic, no cyanosis or edema Skin: skin color, texture, turgor normal. No rashes or lesions Lymph nodes: cervical, supraclavicular, and axillary nodes normal. Neurologic: grossly normal  Pelvic: External genitalia:  verrucous grey lesion of right inferior labia majora.                No abnormal inguinal nodes palpated.              Urethra:  normal appearing urethra with no masses, tenderness or lesions              Bartholins and Skenes: normal                 Vagina: normal appearing vagina with normal color and discharge, no lesions.  Atrophy noted.  Sling present and protected.              Cervix: absent              Pap taken: no Bimanual Exam:  Uterus:  absent              Adnexa: no mass, fullness, tenderness              Rectal exam: yes.  Confirms.              Anus:  normal sphincter tone, no lesions  Chaperone was present for exam:  Kari HERO, CMA  ASSESSMENT: Encounter for breast and pelvic exam.  Persona history of risk factors.  Status post TAH/USO for endometriosis.  Status post USO for endometriosis.  Status post Monarch midurethral sling.  Bilateral breast implants, possible rupture of right implant.  Right vulvar lesion.  On Plavix .  Takes Tramadol  for chronic back  pain.   Hemorrhoids.   PLAN: Mammogram screening discussed. Self breast awareness reviewed. Pap and HRV collected:  no.  Not indicated.  Guidelines for Calcium , Vitamin D , regular exercise program including cardiovascular and weight bearing exercise. Medication refills:  NA Follow up:  yearly.  Will also return for vulvar biopsy.   Patient will see her GI for her hemorrhoid concerns.   Additional counseling given.  yes. 30 min  total time was spent for this patient encounter, including preparation, face-to-face counseling with the patient, coordination of care, and documentation of the encounter in addition to doing the breast and pelvic exam.

## 2024-03-31 ENCOUNTER — Ambulatory Visit (INDEPENDENT_AMBULATORY_CARE_PROVIDER_SITE_OTHER): Payer: Self-pay

## 2024-03-31 VITALS — BP 144/78 | HR 78 | Ht 66.0 in | Wt 192.8 lb

## 2024-03-31 DIAGNOSIS — T8544XA Capsular contracture of breast implant, initial encounter: Secondary | ICD-10-CM

## 2024-03-31 DIAGNOSIS — N644 Mastodynia: Secondary | ICD-10-CM

## 2024-03-31 DIAGNOSIS — T8543XA Leakage of breast prosthesis and implant, initial encounter: Secondary | ICD-10-CM

## 2024-03-31 NOTE — Progress Notes (Signed)
 CLINIC CONSULT    NAME: [name] MRN: [id number] DOB: [birth year] ERE:Ylwuzm, Garnette KIDD, MD  CHIEF COMPLAINT:Possible Breast Implant Rupture  HPI:  [name] is a 73 y.o. year old female with extensive PMH and PSH who presents with: Rupture silicone implant on the right side and left breast pain.   She has capsular contracture that includes pain and hardness on the left and hardness on the right. She is concerned about rupture on the right side. She has not had an MRI. She would like the implants removed and the capsules removed.   She would like a mastopexy  if feasible.  PMH: Past Medical History:  Diagnosis Date   Allergy    Anxiety    Arthritis    back & knee   Asthma     mild per pt shows up with resp illness   Colon polyps    Constipation    Diabetes mellitus without complication (HCC)    Endometriosis    Gallstones    Gastric polyps    Gastroparesis    GERD (gastroesophageal reflux disease)    Headache    sinus headaches and migraines at times   Heart murmur    History of migraine headaches    HTN (hypertension)    Hyperlipidemia    IBS (irritable bowel syndrome)    Joint pain    Lumbar disc disease    PONV (postoperative nausea and vomiting)    S/P aortic valve replacement with bioprosthetic valve 08/23/2016   25 mm Edwards Intuity rapid-deployment bovine pericardial tissue valve via partial upper mini sternotomy   Sleep apnea    does not use CPAP   TIA (transient ischemic attack)    TIA (transient ischemic attack)    hx of per pt     PSH: Past Surgical History:  Procedure Laterality Date   ABDOMINAL HYSTERECTOMY     AORTIC VALVE REPLACEMENT N/A 08/23/2016   Procedure: AORTIC VALVE REPLACEMENT (AVR) - using partial Upper Sternotomy- 25mm Edwards Intuity Aortic Valve used;  Surgeon: Sudie VEAR Laine, MD;  Location: [facility] OR;  Service: Open Heart Surgery;  Laterality: N/A;   BLADDER SUSPENSION  2007   [facility]arch sling   BREAST ENHANCEMENT  SURGERY  1980   CARPAL TUNNEL RELEASE Bilateral    COLONOSCOPY     DORSAL COMPARTMENT RELEASE Right 09/15/2014   Procedure: RIGHT WRIST DEQUERVAINS RELEASE ;  Surgeon: Toribio Chancy, MD;  Location: [facility];  Service: Orthopedics;  Laterality: Right;   DRUG INDUCED ENDOSCOPY N/A 02/19/2022   Procedure: DRUG INDUCED SLEEP ENDOSCOPY;  Surgeon: Mable Lenis, MD;  Location: [facility];  Service: ENT;  Laterality: N/A;   ESOPHAGOGASTRODUODENOSCOPY     EYE SURGERY     cataract surgery   IMPLANTATION OF HYPOGLOSSAL NERVE STIMULATOR Right 04/23/2022   Procedure: IMPLANTATION OF HYPOGLOSSAL NERVE STIMULATOR;  Surgeon: Mable Lenis, MD;  Location: [facility];  Service: ENT;  Laterality: Right;   KNEE ARTHROSCOPY  04/15/2012   Procedure: ARTHROSCOPY KNEE;  Surgeon: Toribio JULIANNA Chancy, MD;  Location: [facility] OR;  Service: Orthopedics;  Laterality: Right;   LAPAROSCOPIC CHOLECYSTECTOMY     LASIK     OOPHORECTOMY     PALATE TO GINGIVA GRAFT  2017   REVERSE SHOULDER ARTHROPLASTY Left 02/13/2021   Procedure: REVERSE SHOULDER ARTHROPLASTY;  Surgeon: Josefina Chew, MD;  Location: [facility];  Service: Orthopedics;  Laterality: Left;   RIGHT/LEFT HEART CATH AND CORONARY ANGIOGRAPHY N/A 08/02/2016  Procedure: Right/Left Heart Cath and Coronary Angiography;  Surgeon: Ozell Fell, MD;  Location: [facility] INVASIVE CV LAB;  Service: Cardiovascular;  Laterality: N/A;   ROTATOR CUFF REPAIR Left    SHOULDER ARTHROSCOPY WITH ROTATOR CUFF REPAIR AND SUBACROMIAL DECOMPRESSION Right 12/24/2022   Procedure: SHOULDER ARTHROSCOPY WITH DEBRIDEMENT, ROTATOR CUFF REPAIR AND SUBACROMIAL DECOMPRESSION;  Surgeon: Josefina Chew, MD;  Location: [facility];  Service: Orthopedics;  Laterality: Right;   TEE WITHOUT CARDIOVERSION N/A 08/23/2016   Procedure: TRANSESOPHAGEAL ECHOCARDIOGRAM (TEE);  Surgeon: Sudie VEAR Laine, MD;  Location: [facility] OR;  Service: Open Heart Surgery;   Laterality: N/A;   TOTAL KNEE ARTHROPLASTY  04/15/2012   Procedure: TOTAL KNEE ARTHROPLASTY;  Surgeon: Toribio JULIANNA Chancy, MD;  Location: [facility] OR;  Service: Orthopedics;  Laterality: Right;   TRIGGER FINGER RELEASE Right 04/20/2015   Procedure: RIGHT TRIGGER FINGER RELEASE (TENDON SHEALTH INCISION) ;  Surgeon: Toribio JULIANNA Chancy, MD;  Location: [facility];  Service: Orthopedics;  Laterality: Right;    MEDICATIONS:   Current Outpatient Medications:    Accu-Chek Softclix Lancets lancets, Use as instructed, Disp: 100 each, Rfl: 12   amLODipine  (NORVASC ) 10 MG tablet, Take 1 tablet (10 mg total) by mouth daily., Disp: 90 tablet, Rfl: 3   atorvastatin  (LIPITOR) 40 MG tablet, TAKE 1 TABLET BY MOUTH DAILY AT 6 PM., Disp: 90 tablet, Rfl: 3   Biotin 5 MG TBDP, SMARTSIG:1 By Mouth, Disp: , Rfl:    blood glucose meter kit and supplies KIT, Dispense based on patient and insurance preference. Use up to four times daily as directed. Dx: E11.9, Disp: 1 each, Rfl: 0   calcium -vitamin D  (OSCAL WITH D) 500-200 MG-UNIT per tablet, Take 2 tablets by mouth daily. , Disp: , Rfl:    clindamycin  (CLEOCIN ) 300 MG capsule, TAKE 2 CAPSULES BY MOUTH 1 HOUR PRIOR TO DENTAL CLEANING., Disp: 6 capsule, Rfl: 2   cloNIDine  (CATAPRES ) 0.1 MG tablet, Take 1 tablet (0.1 mg total) by mouth at bedtime., Disp: 90 tablet, Rfl: 3   clopidogrel  (PLAVIX ) 75 MG tablet, TAKE 1 TABLET BY MOUTH EVERY DAY, Disp: 90 tablet, Rfl: 3   D-5000 125 MCG (5000 UT) TABS, SMARTSIG:1 Tablet(s) By Mouth, Disp: , Rfl:    demeclocycline (DECLOMYCIN) 150 MG tablet, Take 1 tablet (150 mg total) by mouth 2 (two) times daily., Disp: 180 tablet, Rfl: 3   empagliflozin  (JARDIANCE ) 10 MG TABS tablet, Take 1 tablet (10 mg total) by mouth daily., Disp: 30 tablet, Rfl: 11   fluticasone  (FLONASE ) 50 MCG/ACT nasal spray, Place 2 sprays into both nostrils daily., Disp: , Rfl:    glucose blood (ACCU-CHEK GUIDE) test strip, Use to test blood sugars daily. Dx:  E11.9, Disp: 100 strip, Rfl: 12   ipratropium (ATROVENT) 0.03 % nasal spray, 2 SPRAYS IN EACH NOSTRIL AS NEEDED THREE TIMES AS NEEDED, Disp: , Rfl:    irbesartan  (AVAPRO ) 300 MG tablet, Take 1 tablet (300 mg total) by mouth daily., Disp: 90 tablet, Rfl: 3   loratadine  (CLARITIN ) 10 MG tablet, Take 10 mg by mouth at bedtime. , Disp: , Rfl:    Melatonin 5 MG TABS, Take 5 mg by mouth at bedtime as needed (sleep)., Disp: , Rfl:    metFORMIN  (GLUCOPHAGE ) 500 MG tablet, TAKE 1 TABLET BY MOUTH WITH BREAKFAST AND 1/2 TO 1 TABLET WITH DINNER, Disp: 180 tablet, Rfl: 1   metoCLOPramide  (REGLAN ) 10 MG tablet, TAKE 1/2 TABLET BY MOUTH 4 TIMES A DAY, Disp: 60 tablet, Rfl: 3  metroNIDAZOLE (METROCREAM) 0.75 % cream, , Disp: , Rfl:    Multiple Vitamins-Minerals (MULTIVITAMIN ADULTS) TABS, SMARTSIG:1 By Mouth, Disp: , Rfl:    omeprazole  (PRILOSEC) 40 MG capsule, Take 1 capsule by mouth daily., Disp: , Rfl:    Probiotic Product (ALIGN) 4 MG CAPS, Take 4 mg by mouth daily. , Disp: , Rfl:    psyllium (METAMUCIL SMOOTH TEXTURE) 28 % packet, Take 1 packet by mouth 2 (two) times daily., Disp: , Rfl:    sennosides-docusate sodium  (SENOKOT-S) 8.6-50 MG tablet, Take 2 tablets by mouth daily., Disp: 30 tablet, Rfl: 1   sodium chloride  1 g tablet, Take 3 g by mouth 2 (two) times daily with a meal., Disp: , Rfl:    traMADol  (ULTRAM ) 50 MG tablet, TAKE 1 TABLET (50 MG TOTAL) BY MOUTH EVERY 8 (EIGHT) HOURS AS NEEDED FOR MODERATE PAIN (PAIN SCORE 4-6) OR SEVERE PAIN (PAIN SCORE 7-10) (FOR CHRONIC PAIN. DO NOT DRIVE FOR 8 HOURS AFTER TAKING)., Disp: 60 tablet, Rfl: 5   venlafaxine  XR (EFFEXOR -XR) 75 MG 24 hr capsule, Take 1 capsule (75 mg total) by mouth daily with breakfast., Disp: 90 capsule, Rfl: 3  ALLERGIES:  is allergic to augmentin  [amoxicillin -pot clavulanate], erythromycin, and penicillins.  FAMILY HISTORY: Family History  Problem Relation Age of Onset   COPD Mother    Colon polyps Mother    Irritable bowel  syndrome Mother    Anxiety disorder Mother    Heart disease Father    Alcohol abuse Father    Breast cancer Maternal Grandmother    Gallbladder disease Maternal Grandmother    Colon polyps Maternal Aunt    Diabetes Paternal Uncle        several uncles   Other Neg Hx        hyponatremia   Colon cancer Neg Hx    Esophageal cancer Neg Hx    Rectal cancer Neg Hx    Stomach cancer Neg Hx      EXAM:  Blood pressure (!) 144/78, pulse 78, height 5' 6 (1.676 m), weight 192 lb 12.8 oz (87.5 kg), SpO2 95%. Constitutional: Good color, good hydration. VSS.  Head and Neck: No lymphadenopathy, thyromegaly or masses Chest: Normal breathing, Normal shape and excursion.  Breasts: Pseudoptosis, Grade 3 ptosis, No masses, nipple discharge or axillary lymphadenopathy. Grade 2 Capsular Contracture on the right and grade 4 on the left. SN-N LEFT:   30   cm; RIGHT:   29   cm; IMF-N LEFT:   8.5  Cm, RIGHT:   9   Cm; BD LEFT:  15   cm;  RIGHT:   14.5   cm.  ASSESSMENT/PLAN The risks, benefits, goals and alternatives of total capsulectomy and implant removal were discussed in detail.  We discussed the differences between the types of capsulectomy.  We will perform a total capsulectomy that may involve cauterization and curettage of areas that are very stuck to the chest wall.  Capsules will be sent to pathology as removal of the implants.  We will obtain pictures of the implants and capsules.  If we see anything unusual we would send further testing but otherwise we would not do any other tests.  Risks of pneumothorax chest wall injury bleeding infection irregularity and cosmetic concerns requiring revisional surgery were discussed in detail.  Revisional surgery may require financial burden.  Symmetry is often difficult once implants are removed.  Other risks of anesthesia including DVT PE and anesthetic complications were discussed also.  This operation if  is somewhat from a breast augmentation in that it does  require placement drains also and this was discussed.  Some patients require mastopexy.  The scars of the different types of mastopexy were discussed in detail. I recommended against it as less surgery is probably better given co morbidities yet patient expressed desire of having everything doe on the same operation. A fee estimate was provided, and patient financial responsibilities were discussed by our team. We obtained pictures today and will provide detailed quotes.  Patient will need PCP, Pulmonary and Cardiology clearances before surgery. We will order an US  of bilateral breasts to better determine implant position as patient has metal and can not undergo MRI.   Keary Waterson M. Sabrine Patchen, MD [facility]

## 2024-04-01 ENCOUNTER — Telehealth: Payer: Self-pay

## 2024-04-01 NOTE — Telephone Encounter (Signed)
 [name], [name], pt just called and this is her Cardio provider  Pt called for [employer]

## 2024-04-02 ENCOUNTER — Telehealth: Payer: Self-pay | Admitting: *Deleted

## 2024-04-02 NOTE — Telephone Encounter (Signed)
 Faxed surgical clearance to [employer], MD.  Received fax confirmation

## 2024-04-02 NOTE — Telephone Encounter (Signed)
   Pre-operative Risk Assessment    Patient Name: [name]  DOB: 1951/02/07 MRN: [id number]   Date of last office visit: 09/15/2023 Date of next office visit: 05/04/2024   Request for Surgical Clearance    Procedure:  BL TOTAL CAPSULECTOMY WITH REMOVAL OF IMPLANTS AND POSSIBLE MASTOPEXY  Date of Surgery:  Clearance TBD                                Surgeon:  DR. [facility] Surgeon's Group or Practice Name:  [facility] Phone number:  [phone] Fax number:  [phone]   Type of Clearance Requested:   - Medical  - Pharmacy:  Hold Clopidogrel  (Plavix ) NOT INDICATED   Type of Anesthesia:  General    Additional requests/questions:    Signed, Apolinar Essex   04/02/2024, 5:13 PM

## 2024-04-05 ENCOUNTER — Telehealth: Payer: Self-pay | Admitting: Family Medicine

## 2024-04-05 ENCOUNTER — Telehealth (HOSPITAL_BASED_OUTPATIENT_CLINIC_OR_DEPARTMENT_OTHER): Payer: Self-pay | Admitting: *Deleted

## 2024-04-05 NOTE — Telephone Encounter (Signed)
   Name: [name]  DOB: [birth year]  MRN: [id number]  Primary Cardiologist: Powell FORBES Sorrow, MD (Inactive)  Chart reviewed as part of pre-operative protocol coverage. Because of [name]'s past medical history and time since last visit, she will require a follow-up telephone visit in order to better assess preoperative cardiovascular risk.  Pre-op covering staff: - Please schedule appointment and call patient to inform them. If patient already had an upcoming appointment within acceptable timeframe, please add pre-op clearance to the appointment notes so provider is aware. - Please contact requesting surgeon's office via preferred method (i.e, phone, fax) to inform them of need for appointment prior to surgery.  We do not prescribe her Plavix .  Guidance should come from prescribing provider (Dr. Katrinka).   Orren LOISE Fabry, PA-C  04/05/2024, 7:13 AM

## 2024-04-05 NOTE — Telephone Encounter (Signed)
 Pt has bee scheduled tele preop appt 04/14/24. Med rec and consent are done.

## 2024-04-05 NOTE — Telephone Encounter (Signed)
 [facility] faxed Surgical Clearance, to be filled out by provider. Patient requested to send it back via Fax within ASAP. Document is located in providers tray at front office.Please advise at [phone].

## 2024-04-05 NOTE — Telephone Encounter (Signed)
 Pt has bee scheduled tele preop appt 04/14/24. Med rec and consent are done.     Patient Consent for Virtual Visit        [name] has provided verbal consent on 04/05/2024 for a virtual visit (video or telephone).   CONSENT FOR VIRTUAL VISIT FOR:  [name]  By participating in this virtual visit I agree to the following:  I hereby voluntarily request, consent and authorize [facility] and its employed or contracted physicians, physician assistants, nurse practitioners or other licensed health care professionals (the Practitioner), to provide me with telemedicine health care services (the "Services) as deemed necessary by the treating Practitioner. I acknowledge and consent to receive the Services by the Practitioner via telemedicine. I understand that the telemedicine visit will involve communicating with the Practitioner through live audiovisual communication technology and the disclosure of certain medical information by electronic transmission. I acknowledge that I have been given the opportunity to request an in-person assessment or other available alternative prior to the telemedicine visit and am voluntarily participating in the telemedicine visit.  I understand that I have the right to withhold or withdraw my consent to the use of telemedicine in the course of my care at any time, without affecting my right to future care or treatment, and that the Practitioner or I may terminate the telemedicine visit at any time. I understand that I have the right to inspect all information obtained and/or recorded in the course of the telemedicine visit and may receive copies of available information for a reasonable fee.  I understand that some of the potential risks of receiving the Services via telemedicine include:  Delay or interruption in medical evaluation due to technological equipment failure or disruption; Information transmitted may not be sufficient (e.g. poor  resolution of images) to allow for appropriate medical decision making by the Practitioner; and/or  In rare instances, security protocols could fail, causing a breach of personal health information.  Furthermore, I acknowledge that it is my responsibility to provide information about my medical history, conditions and care that is complete and accurate to the best of my ability. I acknowledge that Practitioner's advice, recommendations, and/or decision may be based on factors not within their control, such as incomplete or inaccurate data provided by me or distortions of diagnostic images or specimens that may result from electronic transmissions. I understand that the practice of medicine is not an exact science and that Practitioner makes no warranties or guarantees regarding treatment outcomes. I acknowledge that a copy of this consent can be made available to me via my patient portal [facility] MyChart), or I can request a printed copy by calling the office of [facility].    I understand that my insurance will be billed for this visit.   I have read or had this consent read to me. I understand the contents of this consent, which adequately explains the benefits and risks of the Services being provided via telemedicine.  I have been provided ample opportunity to ask questions regarding this consent and the Services and have had my questions answered to my satisfaction. I give my informed consent for the services to be provided through the use of telemedicine in my medical care

## 2024-04-06 ENCOUNTER — Telehealth: Payer: Self-pay | Admitting: Pulmonary Disease

## 2024-04-06 NOTE — Telephone Encounter (Signed)
 Fax received from Dr. Montorfano with [facility] to perform a BL total Capsulectomy with removal of implants and possible mastoplexy on patient.  Patient needs surgery clearance. Surgery is pending. Patient was seen on 01/15/24. Office protocol is a risk assessment can be sent to surgeon if patient has been seen in 60 days or less.   Sending to Dr Jude for risk assessment or recommendations if patient needs to be seen in office prior to surgical procedure.

## 2024-04-08 ENCOUNTER — Ambulatory Visit (HOSPITAL_COMMUNITY): Admission: RE | Admit: 2024-04-08 | Source: Ambulatory Visit

## 2024-04-08 NOTE — Telephone Encounter (Signed)
 Copy of this encounter was faxed to [facility].

## 2024-04-08 NOTE — Telephone Encounter (Signed)
 Spoke with [name], the [employer] rep. She stated that the patient will need to call [employer] customer service to update them regarding the upcoming surgery. Called [name] to provide this information, patient is aware. She will also update us  with any new information. Nothing further needed at this time.

## 2024-04-11 ENCOUNTER — Other Ambulatory Visit: Payer: Self-pay | Admitting: Family Medicine

## 2024-04-12 ENCOUNTER — Telehealth

## 2024-04-13 ENCOUNTER — Ambulatory Visit
Admission: EM | Admit: 2024-04-13 | Discharge: 2024-04-13 | Disposition: A | Discharge status: Home / Self Care | Attending: Family Medicine | Admitting: Family Medicine

## 2024-04-13 ENCOUNTER — Encounter (INDEPENDENT_AMBULATORY_CARE_PROVIDER_SITE_OTHER): Payer: Self-pay | Admitting: Adult Health

## 2024-04-13 ENCOUNTER — Ambulatory Visit (INDEPENDENT_AMBULATORY_CARE_PROVIDER_SITE_OTHER): Admitting: Adult Health

## 2024-04-13 VITALS — BP 153/71 | HR 72 | Temp 99.0°F | Ht 66.0 in | Wt 182.0 lb

## 2024-04-13 DIAGNOSIS — Z6831 Body mass index (BMI) 31.0-31.9, adult: Secondary | ICD-10-CM

## 2024-04-13 DIAGNOSIS — N3001 Acute cystitis with hematuria: Secondary | ICD-10-CM | POA: Diagnosis not present

## 2024-04-13 DIAGNOSIS — R300 Dysuria: Secondary | ICD-10-CM

## 2024-04-13 DIAGNOSIS — E669 Obesity, unspecified: Secondary | ICD-10-CM

## 2024-04-13 DIAGNOSIS — I152 Hypertension secondary to endocrine disorders: Secondary | ICD-10-CM

## 2024-04-13 DIAGNOSIS — Z7984 Long term (current) use of oral hypoglycemic drugs: Secondary | ICD-10-CM | POA: Diagnosis not present

## 2024-04-13 DIAGNOSIS — E1159 Type 2 diabetes mellitus with other circulatory complications: Secondary | ICD-10-CM | POA: Diagnosis not present

## 2024-04-13 DIAGNOSIS — Z6836 Body mass index (BMI) 36.0-36.9, adult: Secondary | ICD-10-CM

## 2024-04-13 DIAGNOSIS — E1169 Type 2 diabetes mellitus with other specified complication: Secondary | ICD-10-CM | POA: Diagnosis not present

## 2024-04-13 LAB — POCT URINE DIPSTICK
Bilirubin, UA: NEGATIVE
Glucose, UA: 500 mg/dL — AB
Ketones, POC UA: NEGATIVE mg/dL
Nitrite, UA: NEGATIVE
POC PROTEIN,UA: 30 — AB
Spec Grav, UA: <=1.005 — AB (ref 1.010–1.025)
Urobilinogen, UA: 0.2 U/dL
pH, UA: 5.5 (ref 5.0–8.0)

## 2024-04-13 MED ORDER — PHENAZOPYRIDINE HCL 200 MG PO TABS: 200.0000 mg | ORAL_TABLET | Freq: Three times a day (TID) | ORAL | 0 refills | Status: DC | PRN

## 2024-04-13 MED ORDER — CEPHALEXIN 500 MG PO CAPS: 500.0000 mg | ORAL_CAPSULE | Freq: Two times a day (BID) | ORAL | 0 refills | Status: DC

## 2024-04-13 NOTE — ED Provider Notes (Signed)
 [facility]  Note:  This document was prepared using Conservation officer, historic buildings and may include unintentional dictation errors.  MRN: [id number] DOB: 03-11-1951  Subjective:   [name] is a 73 y.o. female presenting for 1 day history of acute onset dysuria, urinary frequency, urinary urgency, chills with urinating, pelvic pressure.  Has a history of UTIs but has been years.  Tries to hydrate but also drinks urinary irritants.  No current facility-administered medications for this encounter.  Current Outpatient Medications:    Accu-Chek Softclix Lancets lancets, Use as instructed, Disp: 100 each, Rfl: 12   amLODipine  (NORVASC ) 10 MG tablet, Take 1 tablet (10 mg total) by mouth daily., Disp: 90 tablet, Rfl: 3   atorvastatin  (LIPITOR) 40 MG tablet, TAKE 1 TABLET BY MOUTH DAILY AT 6 PM., Disp: 90 tablet, Rfl: 3   Biotin 5 MG TBDP, SMARTSIG:1 By Mouth, Disp: , Rfl:    blood glucose meter kit and supplies KIT, Dispense based on patient and insurance preference. Use up to four times daily as directed. Dx: E11.9, Disp: 1 each, Rfl: 0   calcium -vitamin D  (OSCAL WITH D) 500-200 MG-UNIT per tablet, Take 2 tablets by mouth daily. , Disp: , Rfl:    clindamycin  (CLEOCIN ) 300 MG capsule, TAKE 2 CAPSULES BY MOUTH 1 HOUR PRIOR TO DENTAL CLEANING., Disp: 6 capsule, Rfl: 2   cloNIDine  (CATAPRES ) 0.1 MG tablet, Take 1 tablet (0.1 mg total) by mouth at bedtime., Disp: 90 tablet, Rfl: 3   clopidogrel  (PLAVIX ) 75 MG tablet, TAKE 1 TABLET BY MOUTH EVERY DAY, Disp: 90 tablet, Rfl: 3   D-5000 125 MCG (5000 UT) TABS, SMARTSIG:1 Tablet(s) By Mouth, Disp: , Rfl:    demeclocycline (DECLOMYCIN) 150 MG tablet, Take 1 tablet (150 mg total) by mouth 2 (two) times daily., Disp: 180 tablet, Rfl: 3   empagliflozin  (JARDIANCE ) 10 MG TABS tablet, Take 1 tablet (10 mg total) by mouth daily., Disp: 30 tablet, Rfl: 11   fluticasone  (FLONASE ) 50 MCG/ACT nasal spray, Place 2 sprays into both  nostrils daily., Disp: , Rfl:    glucose blood (ACCU-CHEK GUIDE) test strip, Use to test blood sugars daily. Dx: E11.9, Disp: 100 strip, Rfl: 12   ipratropium (ATROVENT) 0.03 % nasal spray, 2 SPRAYS IN EACH NOSTRIL AS NEEDED THREE TIMES AS NEEDED, Disp: , Rfl:    irbesartan  (AVAPRO ) 300 MG tablet, Take 1 tablet (300 mg total) by mouth daily., Disp: 90 tablet, Rfl: 3   loratadine  (CLARITIN ) 10 MG tablet, Take 10 mg by mouth at bedtime. , Disp: , Rfl:    Melatonin 5 MG TABS, Take 5 mg by mouth at bedtime as needed (sleep)., Disp: , Rfl:    metFORMIN  (GLUCOPHAGE ) 500 MG tablet, TAKE 1 TABLET BY MOUTH WITH BREAKFAST AND 1/2 TO 1 TABLET WITH DINNER, Disp: 180 tablet, Rfl: 1   metoCLOPramide  (REGLAN ) 10 MG tablet, TAKE 1/2 TABLET BY MOUTH 4 TIMES A DAY, Disp: 60 tablet, Rfl: 3   metroNIDAZOLE (METROCREAM) 0.75 % cream, , Disp: , Rfl:    Multiple Vitamins-Minerals (MULTIVITAMIN ADULTS) TABS, SMARTSIG:1 By Mouth, Disp: , Rfl:    omeprazole  (PRILOSEC) 40 MG capsule, Take 1 capsule by mouth daily., Disp: , Rfl:    Probiotic Product (ALIGN) 4 MG CAPS, Take 4 mg by mouth daily. , Disp: , Rfl:    psyllium (METAMUCIL SMOOTH TEXTURE) 28 % packet, Take 1 packet by mouth 2 (two) times daily., Disp: , Rfl:    sennosides-docusate sodium  (SENOKOT-S) 8.6-50  MG tablet, Take 2 tablets by mouth daily., Disp: 30 tablet, Rfl: 1   sodium chloride  1 g tablet, Take 3 g by mouth 2 (two) times daily with a meal., Disp: , Rfl:    traMADol  (ULTRAM ) 50 MG tablet, TAKE 1 TABLET (50 MG TOTAL) BY MOUTH EVERY 8 (EIGHT) HOURS AS NEEDED FOR MODERATE PAIN (PAIN SCORE 4-6) OR SEVERE PAIN (PAIN SCORE 7-10) (FOR CHRONIC PAIN. DO NOT DRIVE FOR 8 HOURS AFTER TAKING)., Disp: 60 tablet, Rfl: 5   venlafaxine  XR (EFFEXOR -XR) 75 MG 24 hr capsule, Take 1 capsule (75 mg total) by mouth daily with breakfast., Disp: 90 capsule, Rfl: 3   Allergies  Allergen Reactions   Augmentin  [Amoxicillin -Pot Clavulanate] Rash   Erythromycin Nausea Only    Penicillins Itching and Rash    Has patient had a PCN reaction causing immediate rash, facial/tongue/throat swelling, SOB or lightheadedness with hypotension: No Has patient had a PCN reaction causing severe rash involving mucus membranes or skin necrosis: No Has patient had a PCN reaction that required hospitalization: No Has patient had a PCN reaction occurring within the last 10 years: No  If all of the above answers are NO, then may proceed with Cephalosporin use. Tolerated Ancef  02/13/21    Past Medical History:  Diagnosis Date   Allergy    Anxiety    Arthritis    back & knee   Asthma     mild per pt shows up with resp illness   Colon polyps    Constipation    Diabetes mellitus without complication (HCC)    Endometriosis    Gallstones    Gastric polyps    Gastroparesis    GERD (gastroesophageal reflux disease)    Headache    sinus headaches and migraines at times   Heart murmur    History of migraine headaches    HTN (hypertension)    Hyperlipidemia    IBS (irritable bowel syndrome)    Joint pain    Lumbar disc disease    PONV (postoperative nausea and vomiting)    S/P aortic valve replacement with bioprosthetic valve 08/23/2016   25 mm Edwards Intuity rapid-deployment bovine pericardial tissue valve via partial upper mini sternotomy   Sleep apnea    does not use CPAP   TIA (transient ischemic attack)    TIA (transient ischemic attack)    hx of per pt     Past Surgical History:  Procedure Laterality Date   ABDOMINAL HYSTERECTOMY     AORTIC VALVE REPLACEMENT N/A 08/23/2016   Procedure: AORTIC VALVE REPLACEMENT (AVR) - using partial Upper Sternotomy- 25mm Edwards Intuity Aortic Valve used;  Surgeon: Sudie VEAR Laine, MD;  Location: [facility] OR;  Service: Open Heart Surgery;  Laterality: N/A;   BLADDER SUSPENSION  2007   [facility] sling   BREAST ENHANCEMENT SURGERY  1980   CARPAL TUNNEL RELEASE Bilateral    COLONOSCOPY     DORSAL COMPARTMENT RELEASE Right 09/15/2014    Procedure: RIGHT WRIST DEQUERVAINS RELEASE ;  Surgeon: Toribio Chancy, MD;  Location: [facility];  Service: Orthopedics;  Laterality: Right;   DRUG INDUCED ENDOSCOPY N/A 02/19/2022   Procedure: DRUG INDUCED SLEEP ENDOSCOPY;  Surgeon: Mable Lenis, MD;  Location: [facility];  Service: ENT;  Laterality: N/A;   ESOPHAGOGASTRODUODENOSCOPY     EYE SURGERY     cataract surgery   IMPLANTATION OF HYPOGLOSSAL NERVE STIMULATOR Right 04/23/2022   Procedure: IMPLANTATION OF HYPOGLOSSAL NERVE STIMULATOR;  Surgeon: Mable Lenis, MD;  Location: [facility];  Service: ENT;  Laterality: Right;   KNEE ARTHROSCOPY  04/15/2012   Procedure: ARTHROSCOPY KNEE;  Surgeon: Toribio JULIANNA Chancy, MD;  Location: [facility] OR;  Service: Orthopedics;  Laterality: Right;   LAPAROSCOPIC CHOLECYSTECTOMY     LASIK     OOPHORECTOMY     PALATE TO GINGIVA GRAFT  2017   REVERSE SHOULDER ARTHROPLASTY Left 02/13/2021   Procedure: REVERSE SHOULDER ARTHROPLASTY;  Surgeon: Josefina Chew, MD;  Location: [facility];  Service: Orthopedics;  Laterality: Left;   RIGHT/LEFT HEART CATH AND CORONARY ANGIOGRAPHY N/A 08/02/2016   Procedure: Right/Left Heart Cath and Coronary Angiography;  Surgeon: Ozell Fell, MD;  Location: [facility] INVASIVE CV LAB;  Service: Cardiovascular;  Laterality: N/A;   ROTATOR CUFF REPAIR Left    SHOULDER ARTHROSCOPY WITH ROTATOR CUFF REPAIR AND SUBACROMIAL DECOMPRESSION Right 12/24/2022   Procedure: SHOULDER ARTHROSCOPY WITH DEBRIDEMENT, ROTATOR CUFF REPAIR AND SUBACROMIAL DECOMPRESSION;  Surgeon: Josefina Chew, MD;  Location: [facility];  Service: Orthopedics;  Laterality: Right;   TEE WITHOUT CARDIOVERSION N/A 08/23/2016   Procedure: TRANSESOPHAGEAL ECHOCARDIOGRAM (TEE);  Surgeon: Sudie VEAR Laine, MD;  Location: [facility] OR;  Service: Open Heart Surgery;  Laterality: N/A;   TOTAL KNEE ARTHROPLASTY  04/15/2012   Procedure: TOTAL KNEE ARTHROPLASTY;  Surgeon: Toribio JULIANNA Chancy,  MD;  Location: [facility] OR;  Service: Orthopedics;  Laterality: Right;   TRIGGER FINGER RELEASE Right 04/20/2015   Procedure: RIGHT TRIGGER FINGER RELEASE (TENDON SHEALTH INCISION) ;  Surgeon: Toribio JULIANNA Chancy, MD;  Location: [facility];  Service: Orthopedics;  Laterality: Right;    Family History  Problem Relation Age of Onset   COPD Mother    Colon polyps Mother    Irritable bowel syndrome Mother    Anxiety disorder Mother    Heart disease Father    Alcohol abuse Father    Breast cancer Maternal Grandmother    Gallbladder disease Maternal Grandmother    Colon polyps Maternal Aunt    Diabetes Paternal Uncle        several uncles   Other Neg Hx        hyponatremia   Colon cancer Neg Hx    Esophageal cancer Neg Hx    Rectal cancer Neg Hx    Stomach cancer Neg Hx     Social History   Tobacco Use   Smoking status: Never   Smokeless tobacco: Never  Vaping Use   Vaping status: Never Used  Substance Use Topics   Alcohol use: No   Drug use: No    ROS   Objective:   Vitals: BP (!) 167/75 (BP Location: Left Arm)   Pulse 80   Temp 98.3 F (36.8 C) (Oral)   Resp 16   LMP  (LMP Unknown)   SpO2 98%   Physical Exam Constitutional:      General: She is not in acute distress.    Appearance: Normal appearance. She is well-developed. She is not ill-appearing, toxic-appearing or diaphoretic.  HENT:     Head: Normocephalic and atraumatic.     Nose: Nose normal.     Mouth/Throat:     Mouth: Mucous membranes are moist.     Pharynx: Oropharynx is clear.  Eyes:     General: No scleral icterus.       Right eye: No discharge.        Left eye: No discharge.     Extraocular Movements: Extraocular movements intact.     Conjunctiva/sclera: Conjunctivae normal.  Cardiovascular:     Rate and Rhythm: Normal rate.  Pulmonary:     Effort: Pulmonary effort is normal.  Abdominal:     General: Bowel sounds are normal. There is no distension.     Palpations: Abdomen is  soft. There is no mass.     Tenderness: There is no abdominal tenderness. There is no right CVA tenderness, left CVA tenderness, guarding or rebound.  Skin:    General: Skin is warm and dry.  Neurological:     General: No focal deficit present.     Mental Status: She is alert and oriented to person, place, and time.  Psychiatric:        Mood and Affect: Mood normal.        Behavior: Behavior normal.        Thought Content: Thought content normal.        Judgment: Judgment normal.     Results for orders placed or performed during the hospital encounter of 04/13/24 (from the past 24 hours)  POCT URINE DIPSTICK     Status: Abnormal   Collection Time: 04/13/24  1:26 PM  Result Value Ref Range   Color, UA yellow yellow   Clarity, UA cloudy (A) clear   Glucose, UA =500 (A) negative mg/dL   Bilirubin, UA negative negative   Ketones, POC UA negative negative mg/dL   Spec Grav, UA <=8.994 (A) 1.010 - 1.025   Blood, UA large (A) negative   pH, UA 5.5 5.0 - 8.0   POC PROTEIN,UA =30 (A) negative, trace   Urobilinogen, UA 0.2 0.2 or 1.0 E.U./dL   Nitrite, UA Negative Negative   Leukocytes, UA Small (1+) (A) Negative   *Note: Due to a large number of results and/or encounters for the requested time period, some results have not been displayed. A complete set of results can be found in Results Review.    Assessment and Plan :   PDMP not reviewed this encounter.  1. Acute cystitis with hematuria   2. Dysuria    Start cephalexin  to cover for acute cystitis, urine culture pending.  Recommended consistent hydration, limiting urinary irritants. Counseled patient on potential for adverse effects with medications prescribed/recommended today, ER and return-to-clinic precautions discussed, patient verbalized understanding.    Christopher Savannah, [place] 04/13/24 1334

## 2024-04-13 NOTE — Discharge Instructions (Signed)
 Please start cephalexin  to address an urinary tract infection. Make sure you hydrate very well with plain water and a quantity of 64 ounces of water a day.  Please limit drinks that are considered urinary irritants such as fruit juices, soda, sweet tea, coffee, artifical sweetened drinks, energy drinks, alcohol.  These can worsen your urinary and genital symptoms but also be the source of them.  I will let you know about your urine culture results through MyChart to see if we need to prescribe or change your antibiotics based off of those results.

## 2024-04-13 NOTE — ED Triage Notes (Signed)
 Pt reports pressure, increase urinary frequency, chills when urinating and pain since this morning. Pt has not taken any meds for complaints.

## 2024-04-13 NOTE — Progress Notes (Signed)
 WEIGHT SUMMARY AND BIOMETRICS  Vitals Temp: 99 F (37.2 C) BP: (!) 153/71 Pulse Rate: 72 SpO2: 100 %   Anthropometric Measurements Height: 5' 6 (1.676 m) Weight: 182 lb (82.6 kg) BMI (Calculated): 29.39 Weight at Last Visit: 188 lb Weight Lost Since Last Visit: 6 lb Weight Gained Since Last Visit: 0 lb Starting Weight: 220 lb Total Weight Loss (lbs): 38 lb (17.2 kg)   Body Composition  Body Fat %: 44.7 % Fat Mass (lbs): 81.4 lbs Muscle Mass (lbs): 95.4 lbs Total Body Water  (lbs): 70 lbs Visceral Fat Rating : 12   Other Clinical Data Fasting: no Labs: no Today's Visit #: 79 Starting Date: 01/19/18    Chief Complaint:   OBESITY [name] is here to discuss her progress with her obesity treatment plan.  She is on the the Category 3 Plan and states she is following her eating plan approximately 80 % of the time.  She states she is exercising Walking 20 minutes 3 times per week.  Interim History:  [name] present with elevated temperature, dysuria, urinary frequency, and cold chills when urinating. Temp at check in today is 23 F (oral) Her temp typically runs  She denies hematuria, flank pain, or abdominal pain. She denies N/V/C She denies hx of recurrent UTIs Acute GU sx's present for < 24 hours  Of note- she appears fatigued and diaphoretic at OV No acute distress noted, breathing and speaking normally.  Subjective:   1. Dysuria [name] present with elevated temperature, dysuria, urinary frequency, and cold chills when urinating. Temp at check in today is 66 F (oral) Her temp typically runs  She denies hematuria, flank pain, or abdominal pain. She denies N/V/C She denies hx of recurrent UTIs  Of note- she appears fatigued and diaphoretic at OV No acute distress noted, breathing and speaking normally.  2. Type 2 diabetes mellitus with other specified complication, without long-term current use of insulin  [facility]) PCP/Dr. Katrinka  manages daily Metformin  500mg  BID with meals and Jardiance  10mg  SGLT2 added June 2025 She denies sx's of hypoglycemia  3. Hypertension associated with diabetes (HCC) BP elevated at OV, likely related to suspected UTI She denies CP or dyspnea at present Her husband is waiting in clinic to take her to local UC immediately after this appointment.  Assessment/Plan:   1. Dysuria Seek immediate medical care at local UC- her husband will take her after she leaves [facility]  2. Type 2 diabetes mellitus with other specified complication, without long-term current use of insulin  (HCC) (Primary) Continue healthy eating and daily activity Continue antidiabetic regime per PCP  3. Hypertension associated with diabetes (HCC) Continue healthy eating and daily activity Seek immediate medical assistance for suspected UTI  4. Obesity, CURRENT BMI 31.2  [name] is currently in the action stage of change. As such, her goal is to maintain weight for now. She has agreed to the Category 3 Plan.   Exercise goals: No exercise has been prescribed at this time.  Behavioral modification strategies: increasing lean protein intake, decreasing simple carbohydrates, increasing vegetables, increasing water  intake, no skipping meals, meal planning and cooking strategies, keeping healthy foods in the home, ways to avoid boredom eating, and planning for success.  [name] has agreed to follow-up with our clinic in 4 weeks. She was informed of the importance of frequent follow-up visits to maximize her success with intensive lifestyle modifications for her multiple health conditions.   Check Fasting Labs Winter 2025  Objective:   Blood  pressure (!) 153/71, pulse 72, temperature 99 F (37.2 C), height 5' 6 (1.676 m), weight 182 lb (82.6 kg), SpO2 100%. Body mass index is 29.38 kg/m.  General: Cooperative, alert, well developed, in no acute distress. HEENT: Conjunctivae and lids unremarkable. Cardiovascular:  Regular rhythm.  Lungs: Normal work of breathing. Neurologic: No focal deficits.   Lab Results  Component Value Date   CREATININE 0.56 11/06/2023   BUN 16 11/06/2023   NA 131 (L) 11/06/2023   K 4.2 11/06/2023   CL 96 11/06/2023   CO2 27 11/06/2023   Lab Results  Component Value Date   ALT 19 11/06/2023   AST 26 11/06/2023   ALKPHOS 117 11/06/2023   BILITOT 0.4 11/06/2023   Lab Results  Component Value Date   HGBA1C 6.7 (H) 11/06/2023   HGBA1C 7.0 (H) 05/08/2023   HGBA1C 6.3 10/03/2022   HGBA1C 6.3 (H) 03/05/2022   HGBA1C 6.3 09/24/2021   Lab Results  Component Value Date   INSULIN  6.3 03/05/2022   INSULIN  12.9 06/05/2021   INSULIN  7.7 01/25/2021   INSULIN  13.1 04/20/2020   INSULIN  12.1 01/19/2018   Lab Results  Component Value Date   TSH 4.23 09/17/2021   Lab Results  Component Value Date   CHOL 152 05/08/2023   HDL 60.60 05/08/2023   LDLCALC 73 05/08/2023   LDLDIRECT 72.0 11/06/2023   TRIG 92.0 05/08/2023   CHOLHDL 3 05/08/2023   Lab Results  Component Value Date   VD25OH 49 05/02/2023   VD25OH 62.2 03/05/2022   VD25OH 70.1 06/05/2021   Lab Results  Component Value Date   WBC 7.5 11/06/2023   HGB 11.1 (L) 11/06/2023   HCT 34.5 (L) 11/06/2023   MCV 77.8 (L) 11/06/2023   PLT 391.0 11/06/2023   Lab Results  Component Value Date   IRON 48 06/12/2022   TIBC 450.8 (H) 06/12/2022   FERRITIN 12.0 06/12/2022    Attestation Statements:   Reviewed by clinician on day of visit: allergies, medications, problem list, medical history, surgical history, family history, social history, and previous encounter notes.  Time spent on visit including pre-visit chart review and post-visit care and charting was 12 minutes.   I have reviewed the above documentation for accuracy and completeness, and I agree with the above. -  Mahamed Zalewski d. Latravion Graves, NP-C

## 2024-04-14 ENCOUNTER — Ambulatory Visit: Attending: Cardiovascular Disease | Admitting: Nurse Practitioner

## 2024-04-14 ENCOUNTER — Encounter (INDEPENDENT_AMBULATORY_CARE_PROVIDER_SITE_OTHER): Payer: Self-pay | Admitting: Adult Health

## 2024-04-14 DIAGNOSIS — Z01810 Encounter for preprocedural cardiovascular examination: Secondary | ICD-10-CM

## 2024-04-14 NOTE — Progress Notes (Signed)
 Virtual Visit via Telephone Note   Because of Marisel P Riggle co-morbid illnesses, she is at least at moderate risk for complications without adequate follow up.  This format is felt to be most appropriate for this patient at this time.  Due to technical limitations with video connection (technology), today's appointment will be conducted as an audio only telehealth visit, and Shaquan P Serano verbally agreed to proceed in this manner.   All issues noted in this document were discussed and addressed.  No physical exam could be performed with this format.  Evaluation Performed:  Preoperative cardiovascular risk assessment _____________   Date:  04/14/2024   Patient ID:  [name], DOB 1951-04-24, MRN [id number] Patient Location:  Home Provider location:   Office  Primary Care Provider:  Katrinka Garnette KIDD, MD Primary Cardiologist:  Powell FORBES Sorrow, MD (Inactive)  Chief Complaint / Patient Profile   73 y.o. y/o female with a h/o minimally invasive aortic valve replacement, chronic diastolic heart failure, TIA, hypertension, hyperlipidemia, chronic hyponatremia, type 2 diabetes, and OSA who is pending BL TOTAL CAPSULECTOMY WITH REMOVAL OF IMPLANTS AND POSSIBLE MASTOPEXY with Dr. Montorfano of [facility] and presents today for telephonic preoperative cardiovascular risk assessment.  History of Present Illness    JENISSE VULLO is a 73 y.o. female who presents via audio/video conferencing for a telehealth visit today.  Pt was last seen in cardiology clinic on 09/15/2023 by Orren Fabry, PA.  At that time CERISE LIEBER was doing well. The patient is now pending procedure as outlined above. Since her last visit, she has done well from a cardiac standpoint.   She denies chest pain, palpitations, dyspnea, pnd, orthopnea, n, v, dizziness, syncope, edema, weight gain, or early satiety. All other systems reviewed and are otherwise negative  except as noted above.    Past Medical History    Past Medical History:  Diagnosis Date   Allergy    Anxiety    Arthritis    back & knee   Asthma     mild per pt shows up with resp illness   Colon polyps    Constipation    Diabetes mellitus without complication (HCC)    Endometriosis    Gallstones    Gastric polyps    Gastroparesis    GERD (gastroesophageal reflux disease)    Headache    sinus headaches and migraines at times   Heart murmur    History of migraine headaches    HTN (hypertension)    Hyperlipidemia    IBS (irritable bowel syndrome)    Joint pain    Lumbar disc disease    PONV (postoperative nausea and vomiting)    S/P aortic valve replacement with bioprosthetic valve 08/23/2016   25 mm Edwards Intuity rapid-deployment bovine pericardial tissue valve via partial upper mini sternotomy   Sleep apnea    does not use CPAP   TIA (transient ischemic attack)    TIA (transient ischemic attack)    hx of per pt   Past Surgical History:  Procedure Laterality Date   ABDOMINAL HYSTERECTOMY     AORTIC VALVE REPLACEMENT N/A 08/23/2016   Procedure: AORTIC VALVE REPLACEMENT (AVR) - using partial Upper Sternotomy- 25mm Edwards Intuity Aortic Valve used;  Surgeon: Sudie VEAR Laine, MD;  Location: [facility] OR;  Service: Open Heart Surgery;  Laterality: N/A;   BLADDER SUSPENSION  2007   [facility] sling   BREAST ENHANCEMENT SURGERY  1980   CARPAL TUNNEL  RELEASE Bilateral    COLONOSCOPY     DORSAL COMPARTMENT RELEASE Right 09/15/2014   Procedure: RIGHT WRIST DEQUERVAINS RELEASE ;  Surgeon: Toribio Chancy, MD;  Location: [facility];  Service: Orthopedics;  Laterality: Right;   DRUG INDUCED ENDOSCOPY N/A 02/19/2022   Procedure: DRUG INDUCED SLEEP ENDOSCOPY;  Surgeon: Mable Lenis, MD;  Location: [facility];  Service: ENT;  Laterality: N/A;   ESOPHAGOGASTRODUODENOSCOPY     EYE SURGERY     cataract surgery   IMPLANTATION OF HYPOGLOSSAL NERVE STIMULATOR  Right 04/23/2022   Procedure: IMPLANTATION OF HYPOGLOSSAL NERVE STIMULATOR;  Surgeon: Mable Lenis, MD;  Location: [facility];  Service: ENT;  Laterality: Right;   KNEE ARTHROSCOPY  04/15/2012   Procedure: ARTHROSCOPY KNEE;  Surgeon: Toribio JULIANNA Chancy, MD;  Location: [facility] OR;  Service: Orthopedics;  Laterality: Right;   LAPAROSCOPIC CHOLECYSTECTOMY     LASIK     OOPHORECTOMY     PALATE TO GINGIVA GRAFT  2017   REVERSE SHOULDER ARTHROPLASTY Left 02/13/2021   Procedure: REVERSE SHOULDER ARTHROPLASTY;  Surgeon: Josefina Chew, MD;  Location: [facility];  Service: Orthopedics;  Laterality: Left;   RIGHT/LEFT HEART CATH AND CORONARY ANGIOGRAPHY N/A 08/02/2016   Procedure: Right/Left Heart Cath and Coronary Angiography;  Surgeon: Ozell Fell, MD;  Location: [facility] INVASIVE CV LAB;  Service: Cardiovascular;  Laterality: N/A;   ROTATOR CUFF REPAIR Left    SHOULDER ARTHROSCOPY WITH ROTATOR CUFF REPAIR AND SUBACROMIAL DECOMPRESSION Right 12/24/2022   Procedure: SHOULDER ARTHROSCOPY WITH DEBRIDEMENT, ROTATOR CUFF REPAIR AND SUBACROMIAL DECOMPRESSION;  Surgeon: Josefina Chew, MD;  Location: [facility];  Service: Orthopedics;  Laterality: Right;   TEE WITHOUT CARDIOVERSION N/A 08/23/2016   Procedure: TRANSESOPHAGEAL ECHOCARDIOGRAM (TEE);  Surgeon: Sudie VEAR Laine, MD;  Location: [facility] OR;  Service: Open Heart Surgery;  Laterality: N/A;   TOTAL KNEE ARTHROPLASTY  04/15/2012   Procedure: TOTAL KNEE ARTHROPLASTY;  Surgeon: Toribio JULIANNA Chancy, MD;  Location: [facility] OR;  Service: Orthopedics;  Laterality: Right;   TRIGGER FINGER RELEASE Right 04/20/2015   Procedure: RIGHT TRIGGER FINGER RELEASE (TENDON SHEALTH INCISION) ;  Surgeon: Toribio JULIANNA Chancy, MD;  Location: [facility];  Service: Orthopedics;  Laterality: Right;    Allergies  Allergies  Allergen Reactions   Augmentin  [Amoxicillin -Pot Clavulanate] Rash   Erythromycin Nausea Only   Penicillins Itching and Rash    Has  patient had a PCN reaction causing immediate rash, facial/tongue/throat swelling, SOB or lightheadedness with hypotension: No Has patient had a PCN reaction causing severe rash involving mucus membranes or skin necrosis: No Has patient had a PCN reaction that required hospitalization: No Has patient had a PCN reaction occurring within the last 10 years: No  If all of the above answers are NO, then may proceed with Cephalosporin use. Tolerated Ancef  02/13/21    Home Medications    Prior to Admission medications   Medication Sig Start Date End Date Taking? Authorizing Provider  Accu-Chek Softclix Lancets lancets Use as instructed 05/08/23   Katrinka Garnette KIDD, MD  amLODipine  (NORVASC ) 10 MG tablet Take 1 tablet (10 mg total) by mouth daily. 09/15/23   Lucien Orren SAILOR, PA-C  atorvastatin  (LIPITOR) 40 MG tablet TAKE 1 TABLET BY MOUTH DAILY AT 6 PM. 03/05/24   Katrinka Garnette KIDD, MD  Biotin 5 MG TBDP SMARTSIG:1 By Mouth 10/18/21   [provider]  blood glucose meter kit and supplies KIT Dispense based on patient and insurance preference. Use up to  four times daily as directed. Dx: E11.9 12/10/19   Katrinka Garnette KIDD, MD  calcium -vitamin D  (OSCAL WITH D) 500-200 MG-UNIT per tablet Take 2 tablets by mouth daily.     [provider]  cephALEXin  (KEFLEX ) 500 MG capsule Take 1 capsule (500 mg total) by mouth 2 (two) times daily. 04/13/24   Christopher Savannah, PA-C  clindamycin  (CLEOCIN ) 300 MG capsule TAKE 2 CAPSULES BY MOUTH 1 HOUR PRIOR TO DENTAL CLEANING. 11/06/23   Katrinka Garnette KIDD, MD  cloNIDine  (CATAPRES ) 0.1 MG tablet Take 1 tablet (0.1 mg total) by mouth at bedtime. 09/15/23   Lucien Orren SAILOR, PA-C  clopidogrel  (PLAVIX ) 75 MG tablet TAKE 1 TABLET BY MOUTH EVERY DAY 09/02/23   Katrinka Garnette KIDD, MD  D-5000 125 MCG (5000 UT) TABS SMARTSIG:1 Tablet(s) By Mouth 10/18/21   [provider]  demeclocycline (DECLOMYCIN) 150 MG tablet Take 1 tablet (150 mg total) by mouth 2 (two) times daily.  07/11/23   Trixie File, MD  empagliflozin  (JARDIANCE ) 10 MG TABS tablet Take 1 tablet (10 mg total) by mouth daily. 11/27/23   Katrinka Garnette KIDD, MD  fluticasone  (FLONASE ) 50 MCG/ACT nasal spray Place 2 sprays into both nostrils daily.    [provider]  glucose blood (ACCU-CHEK GUIDE) test strip Use to test blood sugars daily. Dx: E11.9 01/06/23   Katrinka Garnette KIDD, MD  ipratropium (ATROVENT) 0.03 % nasal spray 2 SPRAYS IN EACH NOSTRIL AS NEEDED THREE TIMES AS NEEDED 03/29/19   [provider]  irbesartan  (AVAPRO ) 300 MG tablet Take 1 tablet (300 mg total) by mouth daily. 09/15/23   Lucien Orren SAILOR, PA-C  loratadine  (CLARITIN ) 10 MG tablet Take 10 mg by mouth at bedtime.     [provider]  Melatonin 5 MG TABS Take 5 mg by mouth at bedtime as needed (sleep).    [provider]  metFORMIN  (GLUCOPHAGE ) 500 MG tablet TAKE 1 TABLET BY MOUTH WITH BREAKFAST AND 1/2 TO 1 TABLET WITH DINNER 12/11/23   Katrinka Garnette KIDD, MD  metoCLOPramide  (REGLAN ) 10 MG tablet TAKE 1/2 TABLET BY MOUTH 4 TIMES A DAY 04/12/24   Katrinka Garnette KIDD, MD  metroNIDAZOLE (METROCREAM) 0.75 % cream     [provider]  Multiple Vitamins-Minerals (MULTIVITAMIN ADULTS) TABS SMARTSIG:1 By Mouth 10/18/21   [provider]  omeprazole  (PRILOSEC) 40 MG capsule Take 1 capsule by mouth daily.    [provider]  phenazopyridine  (PYRIDIUM ) 200 MG tablet Take 1 tablet (200 mg total) by mouth 3 (three) times daily as needed for pain. 04/13/24   Christopher Savannah, PA-C  Probiotic Product (ALIGN) 4 MG CAPS Take 4 mg by mouth daily.     [provider]  psyllium (METAMUCIL SMOOTH TEXTURE) 28 % packet Take 1 packet by mouth 2 (two) times daily.    [provider]  sennosides-docusate sodium  (SENOKOT-S) 8.6-50 MG tablet Take 2 tablets by mouth daily. 12/24/22   Brown, Blaine K, PA-C  sodium chloride  1 g tablet Take 3 g by mouth 2 (two) times daily with a meal.    [provider]  traMADol  (ULTRAM ) 50 MG tablet TAKE 1 TABLET (50 MG TOTAL) BY MOUTH EVERY 8 (EIGHT) HOURS AS NEEDED FOR MODERATE PAIN (PAIN SCORE 4-6) OR SEVERE PAIN (PAIN SCORE 7-10) (FOR CHRONIC PAIN. DO NOT DRIVE FOR 8 HOURS AFTER TAKING). 03/08/24   Katrinka Garnette KIDD, MD  venlafaxine  XR (EFFEXOR -XR) 75 MG 24 hr capsule Take 1 capsule (75 mg total) by mouth  daily with breakfast. 05/08/23   Katrinka Garnette KIDD, MD    Physical Exam    Vital Signs:  Laytoya P Korver does not have vital signs available for review today.  Given telephonic nature of communication, physical exam is limited. AAOx3. NAD. Normal affect.  Speech and respirations are unlabored.  Accessory Clinical Findings    None  Assessment & Plan    1.  Preoperative Cardiovascular Risk Assessment:  According to the Revised Cardiac Risk Index (RCRI), her Perioperative Risk of Major Cardiac Event is (%): 6.6. Her Functional Capacity in METs is: 5.07 according to the Duke Activity Status Index (DASI). Therefore, based on ACC/AHA guidelines, patient would be at acceptable risk for the planned procedure without further cardiovascular testing.  The patient was advised that if she develops new symptoms prior to surgery to contact our office to arrange for a follow-up visit, and she verbalized understanding.  Patient takes Plavix  for history of TIA, this is not managed by cardiology.  Recommendations for holding Plavix  prior to surgery should come from managing provider (PCP).  A copy of this note will be routed to requesting surgeon.  Time:   Today, I have spent 5 minutes with the patient with telehealth technology discussing medical history, symptoms, and management plan.     Damien JAYSON Braver, NP  04/14/2024, 3:09 PM

## 2024-04-14 NOTE — Addendum Note (Signed)
 Addended by: EUSTACIO POUR on: 04/14/2024 06:12 PM   Modules accepted: Orders

## 2024-04-15 ENCOUNTER — Ambulatory Visit (HOSPITAL_COMMUNITY): Payer: Self-pay

## 2024-04-15 LAB — URINE CULTURE: Culture: 20000 — AB

## 2024-04-15 NOTE — Addendum Note (Signed)
 Addended by: EUSTACIO POUR on: 04/15/2024 06:22 PM   Modules accepted: Orders

## 2024-04-16 ENCOUNTER — Other Ambulatory Visit: Payer: Self-pay

## 2024-04-16 DIAGNOSIS — T8543XA Leakage of breast prosthesis and implant, initial encounter: Secondary | ICD-10-CM

## 2024-04-16 DIAGNOSIS — T8544XA Capsular contracture of breast implant, initial encounter: Secondary | ICD-10-CM

## 2024-04-16 DIAGNOSIS — N644 Mastodynia: Secondary | ICD-10-CM

## 2024-04-20 DIAGNOSIS — M5416 Radiculopathy, lumbar region: Secondary | ICD-10-CM | POA: Diagnosis not present

## 2024-04-20 DIAGNOSIS — M7062 Trochanteric bursitis, left hip: Secondary | ICD-10-CM | POA: Diagnosis not present

## 2024-04-23 ENCOUNTER — Other Ambulatory Visit: Payer: Self-pay

## 2024-04-23 DIAGNOSIS — T8544XA Capsular contracture of breast implant, initial encounter: Secondary | ICD-10-CM

## 2024-04-23 DIAGNOSIS — T8543XA Leakage of breast prosthesis and implant, initial encounter: Secondary | ICD-10-CM

## 2024-04-23 DIAGNOSIS — N644 Mastodynia: Secondary | ICD-10-CM

## 2024-04-26 ENCOUNTER — Ambulatory Visit (INDEPENDENT_AMBULATORY_CARE_PROVIDER_SITE_OTHER): Admitting: Student

## 2024-04-26 VITALS — BP 170/70 | HR 78 | Wt 186.0 lb

## 2024-04-26 DIAGNOSIS — T8544XA Capsular contracture of breast implant, initial encounter: Secondary | ICD-10-CM | POA: Diagnosis not present

## 2024-04-26 DIAGNOSIS — T8543XA Leakage of breast prosthesis and implant, initial encounter: Secondary | ICD-10-CM

## 2024-04-26 DIAGNOSIS — Z803 Family history of malignant neoplasm of breast: Secondary | ICD-10-CM

## 2024-04-26 MED ORDER — ONDANSETRON HCL 4 MG PO TABS: 4.0000 mg | ORAL_TABLET | Freq: Three times a day (TID) | ORAL | 0 refills | Status: AC | PRN

## 2024-04-26 MED ORDER — GABAPENTIN 300 MG PO CAPS: 300.0000 mg | ORAL_CAPSULE | Freq: Two times a day (BID) | ORAL | 0 refills | Status: DC

## 2024-04-26 NOTE — Progress Notes (Addendum)
 Patient ID: [name], female    DOB: [birth year], 73 y.o.   MRN: [id number]  Chief Complaint  Patient presents with   Pre-op Exam      ICD-10-CM   1. Capsular contracture of breast implant, initial encounter  T85.44XA     2. Breast implant rupture, initial encounter  T85.43XA        History of Present Illness: [name] is a 73 y.o.  female  with a history of breast implants.  She presents for preoperative evaluation for upcoming procedure, bilateral implant removals and capsulectomies, scheduled for 05/11/2024 with Dr. Montorfano.  The patient has not had problems with anesthesia.  Patient denies any personal history of breast cancer.  She does report her maternal grandmother had breast cancer.  Patient reports that she had an aortic valve defect and had a valve replacement several years ago.  She states that she continues to see cardiology.  Patient does report she takes Plavix  for her history of TIA.  Patient reports she is not a smoker.  Patient denies taking any birth control or hormone replacement.  She denies any history of miscarriages.  She denies any personal family history of blood clots or clotting diseases.  She denies any recent surgeries or traumas.  She does report she had a bladder infection a few weeks ago which has been resolved and she is no longer taking antibiotics.  Patient does report history of TIA.  She denies any history of heart attack.  She denies any history of Crohn's disease or ulcerative colitis.  She denies any history of COPD.  Patient states that she has mild asthma which is very controlled.  Patient denies any history of cancer.  She denies any varicosities to her lower extremities.  She denies any recent fevers, chills or changes in her health other than the recent bladder infection.  Per chart review, telephone encounter on 04/06/2024 with Dr. Jude from pulmonology states that patient is cleared from OSA standpoint.  As noted that  patient has an inspire device and pulmonology team spoke with inspire rep and the rep stated that the patient would need to call the inspire customer service to update them regarding her upcoming surgery.  I did discuss with the patient that there is possibility/risk of injury to her inspire during the surgery.  I also did discuss with the patient that she will need to call the customer service rep for the inspire device to see which she needs to do prior to surgery.  Patient expressed understanding.  Patient was also seen by cardiology for telephone preoperative visit on 04/14/2024.  Per cardiology note, patient is had an acceptable risk for the planned procedure without cardiovascular testing.  Clearance was also received from patient's primary care provider, Dr. Garnette Lukes.  He recommended that patient hold Plavix  5 days prior to surgery and restart the day after.  He also recommended cardiac clearance, which we have not received as mentioned above.  Summary of Previous Visit: Patient was seen for initial consult by Dr. Montorfano on 03/31/2024.  At this visit, patient reported that she had a capsular contracture that included pain and hardness on the left and hardness on the right.  Patient was concerned about the rupture on the right side.  On the right side.  Patient reported she wanted to have the implants removed and the capsules removed.  It was noted that patient will need PCP, pulmonary and cardiac clearances before surgery.  An ultrasound was ordered of the bilateral breast to determine implant position as patient had metal and could not undergo MRI.  Job: Does not work at this time  PMH Significant for: Migraines, hypertension, asthma, OSA, GERD, type 2 diabetes, GAD  Per chart review, patient's most recent A1c was 6.7 on 11/06/2023.   Past Medical History: Allergies: Allergies  Allergen Reactions   Augmentin  [Amoxicillin -Pot Clavulanate] Rash   Erythromycin Nausea Only    Penicillins Itching and Rash    Has patient had a PCN reaction causing immediate rash, facial/tongue/throat swelling, SOB or lightheadedness with hypotension: No Has patient had a PCN reaction causing severe rash involving mucus membranes or skin necrosis: No Has patient had a PCN reaction that required hospitalization: No Has patient had a PCN reaction occurring within the last 10 years: No  If all of the above answers are NO, then may proceed with Cephalosporin use. Tolerated Ancef  02/13/21    Current Medications:  Current Outpatient Medications:    Accu-Chek Softclix Lancets lancets, Use as instructed, Disp: 100 each, Rfl: 12   amLODipine  (NORVASC ) 10 MG tablet, Take 1 tablet (10 mg total) by mouth daily., Disp: 90 tablet, Rfl: 3   atorvastatin  (LIPITOR) 40 MG tablet, TAKE 1 TABLET BY MOUTH DAILY AT 6 PM., Disp: 90 tablet, Rfl: 3   Biotin 5 MG TBDP, SMARTSIG:1 By Mouth, Disp: , Rfl:    blood glucose meter kit and supplies KIT, Dispense based on patient and insurance preference. Use up to four times daily as directed. Dx: E11.9, Disp: 1 each, Rfl: 0   calcium -vitamin D  (OSCAL WITH D) 500-200 MG-UNIT per tablet, Take 2 tablets by mouth daily. , Disp: , Rfl:    clindamycin  (CLEOCIN ) 300 MG capsule, TAKE 2 CAPSULES BY MOUTH 1 HOUR PRIOR TO DENTAL CLEANING., Disp: 6 capsule, Rfl: 2   cloNIDine  (CATAPRES ) 0.1 MG tablet, Take 1 tablet (0.1 mg total) by mouth at bedtime., Disp: 90 tablet, Rfl: 3   clopidogrel  (PLAVIX ) 75 MG tablet, TAKE 1 TABLET BY MOUTH EVERY DAY, Disp: 90 tablet, Rfl: 3   D-5000 125 MCG (5000 UT) TABS, SMARTSIG:1 Tablet(s) By Mouth, Disp: , Rfl:    demeclocycline (DECLOMYCIN) 150 MG tablet, Take 1 tablet (150 mg total) by mouth 2 (two) times daily., Disp: 180 tablet, Rfl: 3   empagliflozin  (JARDIANCE ) 10 MG TABS tablet, Take 1 tablet (10 mg total) by mouth daily., Disp: 30 tablet, Rfl: 11   fluticasone  (FLONASE ) 50 MCG/ACT nasal spray, Place 2 sprays into both nostrils daily.,  Disp: , Rfl:    gabapentin (NEURONTIN) 300 MG capsule, Take 1 capsule (300 mg total) by mouth 2 (two) times daily for 14 doses., Disp: 14 capsule, Rfl: 0   glucose blood (ACCU-CHEK GUIDE) test strip, Use to test blood sugars daily. Dx: E11.9, Disp: 100 strip, Rfl: 12   ipratropium (ATROVENT) 0.03 % nasal spray, 2 SPRAYS IN EACH NOSTRIL AS NEEDED THREE TIMES AS NEEDED, Disp: , Rfl:    irbesartan  (AVAPRO ) 300 MG tablet, Take 1 tablet (300 mg total) by mouth daily., Disp: 90 tablet, Rfl: 3   loratadine  (CLARITIN ) 10 MG tablet, Take 10 mg by mouth at bedtime. , Disp: , Rfl:    Melatonin 5 MG TABS, Take 5 mg by mouth at bedtime as needed (sleep)., Disp: , Rfl:    metFORMIN  (GLUCOPHAGE ) 500 MG tablet, TAKE 1 TABLET BY MOUTH WITH BREAKFAST AND 1/2 TO 1 TABLET WITH DINNER, Disp: 180 tablet, Rfl: 1   metoCLOPramide  (REGLAN ) 10 MG  tablet, TAKE 1/2 TABLET BY MOUTH 4 TIMES A DAY, Disp: 60 tablet, Rfl: 3   metroNIDAZOLE (METROCREAM) 0.75 % cream, , Disp: , Rfl:    Multiple Vitamins-Minerals (MULTIVITAMIN ADULTS) TABS, SMARTSIG:1 By Mouth, Disp: , Rfl:    omeprazole  (PRILOSEC) 40 MG capsule, Take 1 capsule by mouth daily., Disp: , Rfl:    ondansetron  (ZOFRAN ) 4 MG tablet, Take 1 tablet (4 mg total) by mouth every 8 (eight) hours as needed for up to 20 doses for nausea or vomiting., Disp: 20 tablet, Rfl: 0   Probiotic Product (ALIGN) 4 MG CAPS, Take 4 mg by mouth daily. , Disp: , Rfl:    psyllium (METAMUCIL SMOOTH TEXTURE) 28 % packet, Take 1 packet by mouth 2 (two) times daily., Disp: , Rfl:    sennosides-docusate sodium  (SENOKOT-S) 8.6-50 MG tablet, Take 2 tablets by mouth daily., Disp: 30 tablet, Rfl: 1   sodium chloride  1 g tablet, Take 3 g by mouth 2 (two) times daily with a meal., Disp: , Rfl:    traMADol  (ULTRAM ) 50 MG tablet, TAKE 1 TABLET (50 MG TOTAL) BY MOUTH EVERY 8 (EIGHT) HOURS AS NEEDED FOR MODERATE PAIN (PAIN SCORE 4-6) OR SEVERE PAIN (PAIN SCORE 7-10) (FOR CHRONIC PAIN. DO NOT DRIVE FOR 8 HOURS  AFTER TAKING)., Disp: 60 tablet, Rfl: 5   venlafaxine  XR (EFFEXOR -XR) 75 MG 24 hr capsule, Take 1 capsule (75 mg total) by mouth daily with breakfast., Disp: 90 capsule, Rfl: 3  Past Medical Problems: Past Medical History:  Diagnosis Date   Allergy    Anxiety    Arthritis    back & knee   Asthma     mild per pt shows up with resp illness   Colon polyps    Constipation    Diabetes mellitus without complication (HCC)    Endometriosis    Gallstones    Gastric polyps    Gastroparesis    GERD (gastroesophageal reflux disease)    Headache    sinus headaches and migraines at times   Heart murmur    History of migraine headaches    HTN (hypertension)    Hyperlipidemia    IBS (irritable bowel syndrome)    Joint pain    Lumbar disc disease    PONV (postoperative nausea and vomiting)    S/P aortic valve replacement with bioprosthetic valve 08/23/2016   25 mm Edwards Intuity rapid-deployment bovine pericardial tissue valve via partial upper mini sternotomy   Sleep apnea    does not use CPAP   TIA (transient ischemic attack)    TIA (transient ischemic attack)    hx of per pt    Past Surgical History: Past Surgical History:  Procedure Laterality Date   ABDOMINAL HYSTERECTOMY     AORTIC VALVE REPLACEMENT N/A 08/23/2016   Procedure: AORTIC VALVE REPLACEMENT (AVR) - using partial Upper Sternotomy- 25mm Edwards Intuity Aortic Valve used;  Surgeon: Sudie VEAR Laine, MD;  Location: [facility] OR;  Service: Open Heart Surgery;  Laterality: N/A;   BLADDER SUSPENSION  2007   [facility] sling   BREAST ENHANCEMENT SURGERY  1980   CARPAL TUNNEL RELEASE Bilateral    COLONOSCOPY     DORSAL COMPARTMENT RELEASE Right 09/15/2014   Procedure: RIGHT WRIST DEQUERVAINS RELEASE ;  Surgeon: Toribio Chancy, MD;  Location: [facility];  Service: Orthopedics;  Laterality: Right;   DRUG INDUCED ENDOSCOPY N/A 02/19/2022   Procedure: DRUG INDUCED SLEEP ENDOSCOPY;  Surgeon: Mable Lenis, MD;  Location:  [facility];  Service: ENT;  Laterality: N/A;   ESOPHAGOGASTRODUODENOSCOPY     EYE SURGERY     cataract surgery   IMPLANTATION OF HYPOGLOSSAL NERVE STIMULATOR Right 04/23/2022   Procedure: IMPLANTATION OF HYPOGLOSSAL NERVE STIMULATOR;  Surgeon: Mable Lenis, MD;  Location: [facility];  Service: ENT;  Laterality: Right;   KNEE ARTHROSCOPY  04/15/2012   Procedure: ARTHROSCOPY KNEE;  Surgeon: Toribio JULIANNA Chancy, MD;  Location: [facility] OR;  Service: Orthopedics;  Laterality: Right;   LAPAROSCOPIC CHOLECYSTECTOMY     LASIK     OOPHORECTOMY     PALATE TO GINGIVA GRAFT  2017   REVERSE SHOULDER ARTHROPLASTY Left 02/13/2021   Procedure: REVERSE SHOULDER ARTHROPLASTY;  Surgeon: Josefina Chew, MD;  Location: [facility];  Service: Orthopedics;  Laterality: Left;   RIGHT/LEFT HEART CATH AND CORONARY ANGIOGRAPHY N/A 08/02/2016   Procedure: Right/Left Heart Cath and Coronary Angiography;  Surgeon: Ozell Fell, MD;  Location: [facility] INVASIVE CV LAB;  Service: Cardiovascular;  Laterality: N/A;   ROTATOR CUFF REPAIR Left    SHOULDER ARTHROSCOPY WITH ROTATOR CUFF REPAIR AND SUBACROMIAL DECOMPRESSION Right 12/24/2022   Procedure: SHOULDER ARTHROSCOPY WITH DEBRIDEMENT, ROTATOR CUFF REPAIR AND SUBACROMIAL DECOMPRESSION;  Surgeon: Josefina Chew, MD;  Location: [facility];  Service: Orthopedics;  Laterality: Right;   TEE WITHOUT CARDIOVERSION N/A 08/23/2016   Procedure: TRANSESOPHAGEAL ECHOCARDIOGRAM (TEE);  Surgeon: Sudie VEAR Laine, MD;  Location: [facility] OR;  Service: Open Heart Surgery;  Laterality: N/A;   TOTAL KNEE ARTHROPLASTY  04/15/2012   Procedure: TOTAL KNEE ARTHROPLASTY;  Surgeon: Toribio JULIANNA Chancy, MD;  Location: [facility] OR;  Service: Orthopedics;  Laterality: Right;   TRIGGER FINGER RELEASE Right 04/20/2015   Procedure: RIGHT TRIGGER FINGER RELEASE (TENDON SHEALTH INCISION) ;  Surgeon: Toribio JULIANNA Chancy, MD;  Location: [facility];  Service: Orthopedics;  Laterality:  Right;    Social History: Social History   Socioeconomic History   Marital status: Married    Spouse name: Educational Psychologist   Number of children: 1   Years of education: Not on file   Highest education level: Master's degree (e.g., MA, MS, MEng, MEd, MSW, MBA)  Occupational History   Occupation: Retired, Visual Merchandiser: RETIRED  Tobacco Use   Smoking status: Never   Smokeless tobacco: Never  Vaping Use   Vaping status: Never Used  Substance and Sexual Activity   Alcohol use: No   Drug use: No   Sexual activity: Not Currently    Partners: Male    Birth control/protection: Surgical    Comment: hyst, More than 5, after 16, no std, no cancer, no abnormal pap, no DES  Other Topics Concern   Not on file  Social History Narrative   She lives with husband (1978) and two dogs. Step-son Beverley Seip (1971-psychiatrist in [place]). No grandkids. 2 dogs-sheltie/collie mix and border collie mix      Highest level of education:  Master in education   She is retired clinical biochemist x 32 years.      Hobbies: time with dogs   Right handed   One story home         Social Drivers of Health   Financial Resource Strain: Low Risk  (09/10/2023)   Overall Financial Resource Strain (CARDIA)    Difficulty of Paying Living Expenses: Not hard at all  Food Insecurity: No Food Insecurity (09/10/2023)   Hunger Vital Sign    Worried About Running Out of Food in the Last Year: Never true  Ran Out of Food in the Last Year: Never true  Transportation Needs: No Transportation Needs (09/10/2023)   PRAPARE - Administrator, Civil Service (Medical): No    Lack of Transportation (Non-Medical): No  Physical Activity: Insufficiently Active (09/10/2023)   Exercise Vital Sign    Days of Exercise per Week: 5 days    Minutes of Exercise per Session: 10 min  Stress: No Stress Concern Present (09/10/2023)   [employer] of Occupational Health - Occupational Stress Questionnaire     Feeling of Stress : Not at all  Social Connections: Moderately Isolated (09/10/2023)   Social Connection and Isolation Panel    Frequency of Communication with Friends and Family: Three times a week    Frequency of Social Gatherings with Friends and Family: Three times a week    Attends Religious Services: Never    Active Member of Clubs or Organizations: No    Attends Banker Meetings: Never    Marital Status: Married  Catering Manager Violence: Not At Risk (09/10/2023)   Humiliation, Afraid, Rape, and Kick questionnaire    Fear of Current or Ex-Partner: No    Emotionally Abused: No    Physically Abused: No    Sexually Abused: No    Family History: Family History  Problem Relation Age of Onset   COPD Mother    Colon polyps Mother    Irritable bowel syndrome Mother    Anxiety disorder Mother    Heart disease Father    Alcohol abuse Father    Breast cancer Maternal Grandmother    Gallbladder disease Maternal Grandmother    Colon polyps Maternal Aunt    Diabetes Paternal Uncle        several uncles   Other Neg Hx        hyponatremia   Colon cancer Neg Hx    Esophageal cancer Neg Hx    Rectal cancer Neg Hx    Stomach cancer Neg Hx     Review of Systems: Denies any recent fevers or chills  Physical Exam: Vital Signs BP (!) 170/70 (BP Location: Left Arm, Patient Position: Sitting, Cuff Size: Normal)   Pulse 78   Wt 186 lb (84.4 kg)   LMP  (LMP Unknown)   SpO2 98%   BMI 30.02 kg/m   Physical Exam  Constitutional:      General: Not in acute distress.    Appearance: Normal appearance. Not ill-appearing.  HENT:     Head: Normocephalic and atraumatic.  Eyes:     Pupils: Pupils are equal, round Neck:     Musculoskeletal: Normal range of motion.  Cardiovascular:     Rate and Rhythm: Normal rate Pulmonary:     Effort: Pulmonary effort is normal. No respiratory distress.  Musculoskeletal: Normal range of motion.  Skin:    General: Skin is warm and dry.      Findings: No erythema or rash.  Neurological:     Mental Status: Alert and oriented to person, place, and time. Mental status is at baseline.  Psychiatric:        Mood and Affect: Mood normal.        Behavior: Behavior normal.    Assessment/Plan: The patient is scheduled for bilateral implant removal with Dr. Montorfano.  Risks, benefits, and alternatives of procedure discussed, questions answered and consent obtained.    Patient's blood pressure is elevated in clinic today.  She states that she typically does not have issues with her blood pressure  and that it is typically controlled. She denies any new headaches, changes in her vision, chest pain or shortness of breath.  Discussed with her that she should take her blood pressure when she goes home and if it still remains elevated, she needs to reach out to her PCP.  She expressed understanding.  Smoking Status: Non-smoker; Counseling Given?  N/A Last Mammogram: 02/03/2024; Results: New extracapsular silicone around the right implant, no mammographic evidence of malignancy, BI-RADS Category 2: Benign  Caprini Score: 5; Risk Factors include: Age, BMI > 25, and length of planned surgery. Recommendation for mechanical prophylaxis. Encourage early ambulation.   Pictures obtained: @consult   Post-op Rx sent to pharmacy: Gabapentin, Zofran -patient states that she cannot take ibuprofen  at the same time as her Plavix .  She states that she has Tylenol  at home that she is willing to take.  Patient also has tramadol  at home, she will take this for postoperative pain if needed.  Discussed with the patient that she should not take tramadol  and gabapentin at the same time as these can be sedating.  Patient expressed understanding.  Discussed with the patient to hold her Plavix  5 days prior to surgery as recommended by her PCP.  Discussed with the patient to hold any multivitamins, supplements at least 1 week prior to surgery.  Discussed with the patient  to hold her Jardiance , metformin , irbesartan  the day of surgery.  Patient expressed understanding.  Patient was provided with the General Surgical Risk consent document and Pain Medication Agreement prior to their appointment.  They had adequate time to read through the risk consent documents and Pain Medication Agreement. We also discussed them in person together during this preop appointment. All of their questions were answered to their satisfaction.  Recommended calling if they have any further questions.  Risk consent form and Pain Medication Agreement to be scanned into patient's chart.  The consent was obtained with risks and complications reviewed which included bleeding, pain, scar, infection and the risk of anesthesia.  The patients questions were answered to the patients expressed satisfaction.   We did discuss that given some of her comorbidities, she may be at increased risk of perioperative complications.  Patient expressed understanding.  Electronically signed by: Estefana FORBES Peck, PA-C 04/26/2024 4:26 PM

## 2024-04-27 ENCOUNTER — Ambulatory Visit: Admitting: Internal Medicine

## 2024-04-27 ENCOUNTER — Encounter: Payer: Self-pay | Admitting: Internal Medicine

## 2024-04-27 VITALS — BP 128/62 | HR 82 | Ht 66.0 in | Wt 188.0 lb

## 2024-04-27 DIAGNOSIS — E119 Type 2 diabetes mellitus without complications: Secondary | ICD-10-CM

## 2024-04-27 DIAGNOSIS — K5901 Slow transit constipation: Secondary | ICD-10-CM | POA: Diagnosis not present

## 2024-04-27 DIAGNOSIS — K222 Esophageal obstruction: Secondary | ICD-10-CM | POA: Diagnosis not present

## 2024-04-27 DIAGNOSIS — K219 Gastro-esophageal reflux disease without esophagitis: Secondary | ICD-10-CM

## 2024-04-27 DIAGNOSIS — Z1211 Encounter for screening for malignant neoplasm of colon: Secondary | ICD-10-CM

## 2024-04-27 DIAGNOSIS — R1319 Other dysphagia: Secondary | ICD-10-CM | POA: Diagnosis not present

## 2024-04-27 DIAGNOSIS — Z7902 Long term (current) use of antithrombotics/antiplatelets: Secondary | ICD-10-CM

## 2024-04-27 NOTE — Progress Notes (Signed)
 HISTORY OF PRESENT ILLNESS:  [name] is a 73 y.o. female with multiple medical problems as listed below including aortic valve replacement and a history of TIA for which she is on Plavix .  She is also diabetic.  She has been seen in this office for the following issues:  1.  GERD complicated by peptic stricture requiring esophageal dilation.  Last EGD with dilation March 09, 2020.  54 French Maloney dilator 2.  Constipation predominant irritable bowel syndrome.  Currently being managed with MiraLAX. 3.  Screening colonoscopy February 2016.  Normal colonoscopy.  Follow-up 10 years  Patient tells me that she continues on omeprazole  40 mg daily for GERD.  This helps.  However, she has had recurrent intermittent solid food dysphagia over the past year.  This is worsening.  She feels she needs repeat dilation.  She continues on Plavix , which was held for her previous procedure.  Next, she reports that her constipation is treated with MiraLAX.  2 doses daily works nicely.  She wonders if this is okay to take.  She also mentions occasional left lower quadrant pain.  No exacerbating or relieving factors.  She does tell me that she is now due for her follow-up colonoscopy and would like to schedule it concurrent with an endoscopy.  For her diabetes she takes oral agents only her last hemoglobin A1c was 6.29 Oct 2023 normal liver test at that time.  Hemoglobin 11.1 at that time.  Does tell me that she has surgery scheduled this month.  REVIEW OF SYSTEMS:  All non-GI ROS negative except for anxiety, arthritis, headaches  Past Medical History:  Diagnosis Date   Allergy    Anxiety    Arthritis    back & knee   Asthma     mild per pt shows up with resp illness   Colon polyps    Constipation    Diabetes mellitus without complication (HCC)    Endometriosis    Gallstones    Gastric polyps    Gastroparesis    GERD (gastroesophageal reflux disease)    Headache    sinus headaches and  migraines at times   Heart murmur    History of migraine headaches    HTN (hypertension)    Hyperlipidemia    IBS (irritable bowel syndrome)    Joint pain    Lumbar disc disease    PONV (postoperative nausea and vomiting)    S/P aortic valve replacement with bioprosthetic valve 08/23/2016   25 mm Edwards Intuity rapid-deployment bovine pericardial tissue valve via partial upper mini sternotomy   Sleep apnea    does not use CPAP   TIA (transient ischemic attack)    TIA (transient ischemic attack)    hx of per pt    Past Surgical History:  Procedure Laterality Date   ABDOMINAL HYSTERECTOMY     AORTIC VALVE REPLACEMENT N/A 08/23/2016   Procedure: AORTIC VALVE REPLACEMENT (AVR) - using partial Upper Sternotomy- 25mm Edwards Intuity Aortic Valve used;  Surgeon: Sudie VEAR Laine, MD;  Location: [facility] OR;  Service: Open Heart Surgery;  Laterality: N/A;   BLADDER SUSPENSION  2007   [facility] sling   BREAST ENHANCEMENT SURGERY  1980   CARPAL TUNNEL RELEASE Bilateral    COLONOSCOPY     DORSAL COMPARTMENT RELEASE Right 09/15/2014   Procedure: RIGHT WRIST DEQUERVAINS RELEASE ;  Surgeon: Toribio Chancy, MD;  Location: [facility];  Service: Orthopedics;  Laterality: Right;   DRUG INDUCED ENDOSCOPY N/A 02/19/2022  Procedure: DRUG INDUCED SLEEP ENDOSCOPY;  Surgeon: Mable Lenis, MD;  Location: [facility];  Service: ENT;  Laterality: N/A;   ESOPHAGOGASTRODUODENOSCOPY     EYE SURGERY     cataract surgery   IMPLANTATION OF HYPOGLOSSAL NERVE STIMULATOR Right 04/23/2022   Procedure: IMPLANTATION OF HYPOGLOSSAL NERVE STIMULATOR;  Surgeon: Mable Lenis, MD;  Location: [facility];  Service: ENT;  Laterality: Right;   KNEE ARTHROSCOPY  04/15/2012   Procedure: ARTHROSCOPY KNEE;  Surgeon: Toribio JULIANNA Chancy, MD;  Location: [facility] OR;  Service: Orthopedics;  Laterality: Right;   LAPAROSCOPIC CHOLECYSTECTOMY     LASIK     OOPHORECTOMY     PALATE TO GINGIVA GRAFT   2017   REVERSE SHOULDER ARTHROPLASTY Left 02/13/2021   Procedure: REVERSE SHOULDER ARTHROPLASTY;  Surgeon: Josefina Chew, MD;  Location: [facility];  Service: Orthopedics;  Laterality: Left;   RIGHT/LEFT HEART CATH AND CORONARY ANGIOGRAPHY N/A 08/02/2016   Procedure: Right/Left Heart Cath and Coronary Angiography;  Surgeon: Ozell Fell, MD;  Location: [facility] INVASIVE [facility];  Service: Cardiovascular;  Laterality: N/A;   ROTATOR CUFF REPAIR Left    SHOULDER ARTHROSCOPY WITH ROTATOR CUFF REPAIR AND SUBACROMIAL DECOMPRESSION Right 12/24/2022   Procedure: SHOULDER ARTHROSCOPY WITH DEBRIDEMENT, ROTATOR CUFF REPAIR AND SUBACROMIAL DECOMPRESSION;  Surgeon: Josefina Chew, MD;  Location: [facility];  Service: Orthopedics;  Laterality: Right;   TEE WITHOUT CARDIOVERSION N/A 08/23/2016   Procedure: TRANSESOPHAGEAL ECHOCARDIOGRAM (TEE);  Surgeon: Sudie VEAR Laine, MD;  Location[facility] OR;  Service: Open Heart Surgery;  Laterality: N/A;   TOTAL KNEE ARTHROPLASTY  04/15/2012   Procedure: TOTAL KNEE ARTHROPLASTY;  Surgeon: Toribio JULIANNA Chancy, MD;  Location: [facility] OR;  Service: Orthopedics;  Laterality: Right;   TRIGGER FINGER RELEASE Right 04/20/2015   Procedure: RIGHT TRIGGER FINGER RELEASE (TENDON SHEALTH INCISION) ;  Surgeon: Toribio JULIANNA Chancy, MD;  Location:  SURGERY CENTER;  Service: Orthopedics;  Laterality: Right;    Social History Katharine P Mcquillen  reports that she has never smoked. She has never used smokeless tobacco. She reports that she does not drink alcohol and does not use drugs.  family history includes Alcohol abuse in her father; Anxiety disorder in her mother; Breast cancer in her maternal grandmother; COPD in her mother; Colon polyps in her maternal aunt and mother; Diabetes in her paternal uncle; Gallbladder disease in her maternal grandmother; Heart disease in her father; Irritable bowel syndrome in her mother.  Allergies  Allergen Reactions   Augmentin  [Amoxicillin -Pot  Clavulanate] Rash   Erythromycin Nausea Only   Penicillins Itching and Rash    Has patient had a PCN reaction causing immediate rash, facial/tongue/throat swelling, SOB or lightheadedness with hypotension: No Has patient had a PCN reaction causing severe rash involving mucus membranes or skin necrosis: No Has patient had a PCN reaction that required hospitalization: No Has patient had a PCN reaction occurring within the last 10 years: No  If all of the above answers are NO, then may proceed with Cephalosporin use. Tolerated Ancef  02/13/21       PHYSICAL EXAMINATION: Vital signs: BP 128/62 (BP Location: Left Arm, Patient Position: Sitting, Cuff Size: Normal)   Pulse 82   Ht 5' 6 (1.676 m)   Wt 188 lb (85.3 kg)   LMP  (LMP Unknown)   BMI 30.34 kg/m   Constitutional: generally well-appearing, no acute distress Psychiatric: alert and oriented x3, cooperative Eyes: extraocular movements intact, anicteric, conjunctiva pink Mouth: oral pharynx moist, no lesions Neck:  supple no lymphadenopathy Cardiovascular: heart regular rate and rhythm, no murmur Lungs: clear to auscultation bilaterally Abdomen: soft, nontender, nondistended, no obvious ascites, no peritoneal signs, normal bowel sounds, no organomegaly.  Tenderness along the inguinal ligament on the left Rectal: Deferred until colonoscopy Extremities: no clubbing, cyanosis, or lower extremity edema bilaterally Skin: no lesions on visible extremities Neuro: No focal deficits. No asterixis.    ASSESSMENT:  1.  GERD complicated by peptic stricture.  Acid suppressive medication effective.  However, with recurrent dysphagia likely secondary to recurrent peptic stricture. 2.  Constipation predominant IBS.  Managed with MiraLAX 3.  Colonoscopy.  Normal colonoscopy 10 years ago.  Not due for follow-up 4.  Multiple medical problems including the need for chronic Plavix  therapy   PLAN:  1.  Reflux precautions 2.  Continue  omeprazole  3.  Continue MiraLAX for constipation 4.  Schedule upper endoscopy with esophageal dilation.  The patient is high risk given her comorbidities and the need to interrupt Plavix .  She should initiate aspirin  therapy (baby aspirin  1 daily) upon holding Plavix .  She understands.The nature of the procedure, as well as the risks, benefits, and alternatives were carefully and thoroughly reviewed with the patient. Ample time for discussion and questions allowed. The patient understood, was satisfied, and agreed to proceed. 5.  Schedule screening colonoscopy along with upper endoscopy.  She is high risk as above.  We will schedule this concurrently to minimize the time that she is on Plavix  and have 1 anesthesia session.The nature of the procedure, as well as the risks, benefits, and alternatives were carefully and thoroughly reviewed with the patient. Ample time for discussion and questions allowed. The patient understood, was satisfied, and agreed to proceed. The patient will wait until she has recovered from her upcoming surgery to contact the office to schedule her procedures.  She requests a morning appointment.  In addition to holding Plavix  for 5 days and initiating baby aspirin , her oral diabetic medications need to be held the day of the procedure.  We reviewed this issue and she understands. A total time of 60 minutes was spent preparing to see the patient, obtaining comprehensive history, performing medically appropriate physical examination, counseling and educating the patient regarding the above listed issues, ordering multiple and therapeutic endoscopic procedures, adjusting medications, and documenting clinical information in the health record

## 2024-04-27 NOTE — Patient Instructions (Signed)
 Call us  back when you are ready to schedule your endoscopy/colonoscopy.  _______________________________________________________  If your blood pressure at your visit was 140/90 or greater, please contact your primary care physician to follow up on this.  _______________________________________________________  If you are age 73 or older, your body mass index should be between 23-30. Your Body mass index is 30.34 kg/m. If this is out of the aforementioned range listed, please consider follow up with your Primary Care Provider.  If you are age 27 or younger, your body mass index should be between 19-25. Your Body mass index is 30.34 kg/m. If this is out of the aformentioned range listed, please consider follow up with your Primary Care Provider.   ________________________________________________________  The [facility] providers would like to encourage you to use MYCHART to communicate with providers for non-urgent requests or questions.  Due to long hold times on the telephone, sending your provider a message by [facility] may be a faster and more efficient way to get a response.  Please allow 48 business hours for a response.  Please remember that this is for non-urgent requests.  _______________________________________________________  [facility] is using a team-based approach to care.  Your team is made up of your doctor and two to three APPS. Our APPS (Nurse Practitioners and Physician Assistants) work with your physician to ensure care continuity for you. They are fully qualified to address your health concerns and develop a treatment plan. They communicate directly with your gastroenterologist to care for you. Seeing the Advanced Practice Practitioners on your physician's team can help you by facilitating care more promptly, often allowing for earlier appointments, access to diagnostic testing, procedures, and other specialty referrals.

## 2024-04-28 ENCOUNTER — Other Ambulatory Visit

## 2024-04-28 ENCOUNTER — Other Ambulatory Visit: Payer: Self-pay

## 2024-04-28 ENCOUNTER — Ambulatory Visit

## 2024-04-28 ENCOUNTER — Ambulatory Visit: Admission: RE | Admit: 2024-04-28 | Source: Ambulatory Visit

## 2024-04-28 ENCOUNTER — Other Ambulatory Visit: Payer: Self-pay | Admitting: Family Medicine

## 2024-04-28 ENCOUNTER — Encounter

## 2024-04-28 DIAGNOSIS — N644 Mastodynia: Secondary | ICD-10-CM

## 2024-04-28 DIAGNOSIS — T8544XA Capsular contracture of breast implant, initial encounter: Secondary | ICD-10-CM

## 2024-04-28 DIAGNOSIS — T8543XA Leakage of breast prosthesis and implant, initial encounter: Secondary | ICD-10-CM

## 2024-04-28 DIAGNOSIS — M7062 Trochanteric bursitis, left hip: Secondary | ICD-10-CM | POA: Diagnosis not present

## 2024-04-30 ENCOUNTER — Encounter: Payer: Self-pay | Admitting: *Deleted

## 2024-05-03 ENCOUNTER — Encounter: Payer: Self-pay | Admitting: Internal Medicine

## 2024-05-03 ENCOUNTER — Ambulatory Visit: Payer: Medicare PPO | Admitting: Internal Medicine

## 2024-05-03 ENCOUNTER — Other Ambulatory Visit

## 2024-05-03 VITALS — BP 116/60 | HR 88 | Resp 18 | Ht 65.0 in | Wt 184.4 lb

## 2024-05-03 DIAGNOSIS — E559 Vitamin D deficiency, unspecified: Secondary | ICD-10-CM | POA: Diagnosis not present

## 2024-05-03 DIAGNOSIS — R748 Abnormal levels of other serum enzymes: Secondary | ICD-10-CM

## 2024-05-03 DIAGNOSIS — E871 Hypo-osmolality and hyponatremia: Secondary | ICD-10-CM | POA: Diagnosis not present

## 2024-05-03 LAB — VITAMIN D 25 HYDROXY (VIT D DEFICIENCY, FRACTURES): Vit D, 25-Hydroxy: 67 ng/mL (ref 30–100)

## 2024-05-03 LAB — SODIUM: Sodium: 130 mmol/L — ABNORMAL LOW (ref 135–146)

## 2024-05-03 NOTE — Progress Notes (Signed)
 Patient ID: [name], female   DOB: 1951/04/15, 73 y.o.   MRN: [id number]  HPI  [name] is a 73 y.o.-year-old female, returning for follow-up for hyponatremia and elevated alkaline phosphatase.  She previously saw Dr. Kassie, last visit with me 1 year ago.  Interim history: She continues to go to the Weight management clinic. She lost 23 lbs since last OV.  Part of this was after starting Jardiance  per cardiology. No dizziness, falls, HAs.  At last OV, she was drinking: 16 oz of Coke 0 16 oz of Oranjade 8 oz with pills 8-10 oz unsweetened tea 16 oz water  ~8 oz with pills She mentioned that she has been drinking more fluids recently because of dysphagia.  She has esophageal stretching. planned for January 2026.  At last visit, I rec'd the following fluid intake: - 8 oz of Coke 0 in am - 8 oz of Oranjade in am - 8 oz water  in the pm  She actually drinks: - 12-16 oz Coke 0 - 16 oz Oranjade - 8-10 oz water    Pt has been dx with hyponatremia in 2013.   Possibly related to SIADH per my understanding reviewing Dr. Laymond notes.   She is on: - Demeclocycline 300 mg 2x a day - NaCl tablets 3 g 2x a day  I reviewed pt's more recent sodium levels: Lab Results  Component Value Date   NA 131 (L) 11/06/2023   NA 137 05/02/2023   NA 129 (L) 12/19/2022   NA 132 (L) 10/03/2022   NA 134 (L) 04/18/2022   NA 132 (L) 04/03/2022   NA 135 02/14/2022   NA 130 (L) 09/17/2021   NA 135 06/05/2021   NA 136 02/09/2021   NA 132 (L) 10/24/2020   NA 132 (L) 08/15/2020   NA 136 06/09/2020   NA 133 (L) 04/20/2020   NA 131 (L) 12/28/2019   NA 130 (L) 11/18/2019   NA 127 (L) 08/03/2019   NA 133 (L) 06/08/2019   NA 137 02/11/2019   NA 130 (L) 01/20/2019   NA 128 (L) 01/05/2019   NA 131 (L) 06/04/2018   NA 129 (L) 03/24/2018   NA 128 (L) 02/17/2018   NA 128 (L) 02/17/2018   NA 128 (L) 02/12/2018   NA 129 (L) 01/19/2018   NA 130 (L) 11/25/2017   NA 133 (L)  05/23/2017   NA 132 (L) 04/24/2017   MRI brain (09/01/2017): 1. No acute intracranial process. 2. Subacute small RIGHT inferior occipital lobe infarct. 3. Old small cerebellar infarcts and mild chronic small vessel ischemic disease.  Per review of the chart, her hyponatremia is not associated with hypochloremia or alkalosis; she had a urine specific gravity of 1.010 in 2021.  She drinks with meals 2/2 esophageal strictures - she has Es stretched occasionally.  No swelling in legs or arms.   No heart, kidney, liver dysfunction.   No hypertrigyceridemia: Lab Results  Component Value Date   CHOL 152 05/08/2023   HDL 60.60 05/08/2023   LDLCALC 73 05/08/2023   LDLDIRECT 72.0 11/06/2023   TRIG 92.0 05/08/2023   CHOLHDL 3 05/08/2023   Pt also has no h/o OP, but does have osteopenia.  No Br CA history.  No dizziness, no HAs, no blurry vision.   Elevated alkaline phosphatase:  Reviewed her alkaline phosphatase levels: Lab Results  Component Value Date   ALKPHOS 117 11/06/2023   ALKPHOS 119 (H) 10/03/2022   ALKPHOS 118 (H) 04/03/2022  ALKPHOS 251 (H) 06/05/2021   ALKPHOS 149 (H) 08/15/2020   ALKPHOS 171 (H) 04/20/2020   ALKPHOS 151 (H) 11/18/2019   ALKPHOS 160 (H) 09/28/2019   ALKPHOS 192 (H) 08/03/2019   ALKPHOS 155 (H) 06/08/2019   ALKPHOS 151 (H) 02/11/2019   ALKPHOS 128 (H) 01/05/2019   ALKPHOS 151 (H) 06/04/2018   ALKPHOS 144 (H) 02/17/2018   ALKPHOS 164 (H) 01/19/2018   ALKPHOS 146 (H) 11/25/2017   ALKPHOS 138 (H) 05/23/2017   ALKPHOS 163 (H) 04/24/2017   ALKPHOS 213 (H) 09/16/2016   ALKPHOS 151 (H) 08/21/2016   ALKPHOS 156 (H) 05/14/2016   ALKPHOS 150 (H) 11/14/2015   ALKPHOS 188 (H) 05/11/2015   ALKPHOS 200 (H) 11/09/2014   ALKPHOS 172 (H) 05/05/2014   ALKPHOS 145 (H) 12/09/2012   ALKPHOS 145 (H) 04/09/2012   ALKPHOS 119 (H) 08/06/2011   ALKPHOS 110 11/26/2010   ALKPHOS 134 (H) 06/22/2010   ALKPHOS 88 06/05/2010   ALKPHOS 103 03/06/2010   ALKPHOS  105 11/21/2009   ALKPHOS 133 (H) 07/05/2009   ALKPHOS 118 (H) 08/04/2008   ALKPHOS 122 (H) 05/03/2008   ALKPHOS 118 (H) 02/02/2008   ALKPHOS 154 (H) 08/03/2007  05/01/2024: Alkaline phosphatase 158  Had a high bone specific alk phos: 09/17/2021: Bone specific alkaline phosphatase 59  However, a bone scan (08/30/2019) was negative for Paget's disease: Multifocal degenerative changes. No evidence of acute or destructive process.  Other LFTs were normal: Lab Results  Component Value Date   ALT 19 11/06/2023   AST 26 11/06/2023   ALKPHOS 117 11/06/2023   BILITOT 0.4 11/06/2023    No hypercalcemia or hyperparathyroidism: Lab Results  Component Value Date   PTH 22 08/15/2020  . Lab Results  Component Value Date   CALCIUM  9.0 11/06/2023   CALCIUM  8.9 05/02/2023   CALCIUM  8.7 (L) 12/19/2022   CALCIUM  9.3 10/03/2022   CALCIUM  9.3 04/18/2022   CALCIUM  9.2 04/03/2022   CALCIUM  9.4 02/14/2022   CALCIUM  9.1 09/17/2021   CALCIUM  9.1 06/05/2021   CALCIUM  9.3 02/09/2021   CALCIUM  9.4 10/24/2020   CALCIUM  9.7 08/15/2020   CALCIUM  9.4 08/15/2020   CALCIUM  9.6 06/09/2020   CALCIUM  9.4 04/20/2020   CALCIUM  9.4 12/28/2019   CALCIUM  9.0 11/18/2019   CALCIUM  9.0 08/03/2019   CALCIUM  9.1 06/08/2019   CALCIUM  9.3 02/11/2019   CALCIUM  9.6 01/20/2019   CALCIUM  8.9 01/05/2019   CALCIUM  9.1 06/04/2018   CALCIUM  9.3 03/24/2018   CALCIUM  9.4 02/17/2018   CALCIUM  9.6 02/12/2018   CALCIUM  9.2 01/19/2018   CALCIUM  9.4 11/25/2017   CALCIUM  9.3 05/23/2017   CALCIUM  9.2 04/24/2017   She also has a history of DM2 (controlled), HTN, HL, s/p Ao valve replacement, h/o TIA, GERD, gallstones, anxiety. She is taking biotin. Takes a MVI daily, Ca-D  (1000 mg-400 units) + vitamin D  (5000 units daily).  Vitamin D  level was recently normal: Lab Results  Component Value Date   VD25OH 49 05/02/2023   VD25OH 62.2 03/05/2022   VD25OH 70.1 06/05/2021   VD25OH 74.5 01/25/2021   VD25OH 66.5 04/20/2020    VD25OH 52.21 06/08/2019   VD25OH 56.90 01/05/2019   VD25OH 42.08 06/04/2018   VD25OH 27.9 (L) 01/19/2018   She had sleep apnea surgery ([facility]) since last OV  - Dr. Mable.   ROS: + see HPI  Past Medical History:  Diagnosis Date   Allergy    Anxiety    Arthritis    back & knee  Asthma     mild per pt shows up with resp illness   Colon polyps    Constipation    Diabetes mellitus without complication (HCC)    Endometriosis    Gallstones    Gastric polyps    Gastroparesis    GERD (gastroesophageal reflux disease)    Headache    sinus headaches and migraines at times   Heart murmur    History of migraine headaches    HTN (hypertension)    Hyperlipidemia    IBS (irritable bowel syndrome)    Joint pain    Lumbar disc disease    PONV (postoperative nausea and vomiting)    S/P aortic valve replacement with bioprosthetic valve 08/23/2016   25 mm Edwards Intuity rapid-deployment bovine pericardial tissue valve via partial upper mini sternotomy   Sleep apnea    does not use CPAP   TIA (transient ischemic attack)    TIA (transient ischemic attack)    hx of per pt   Past Surgical History:  Procedure Laterality Date   ABDOMINAL HYSTERECTOMY     AORTIC VALVE REPLACEMENT N/A 08/23/2016   Procedure: AORTIC VALVE REPLACEMENT (AVR) - using partial Upper Sternotomy- 25mm Edwards Intuity Aortic Valve used;  Surgeon: Sudie VEAR Laine, MD;  Location: [facility] OR;  Service: Open Heart Surgery;  Laterality: N/A;   BLADDER SUSPENSION  2007   [facility] sling   BREAST ENHANCEMENT SURGERY  1980   CARPAL TUNNEL RELEASE Bilateral    COLONOSCOPY     DORSAL COMPARTMENT RELEASE Right 09/15/2014   Procedure: RIGHT WRIST DEQUERVAINS RELEASE ;  Surgeon: Toribio Chancy, MD;  Location: [facility];  Service: Orthopedics;  Laterality: Right;   DRUG INDUCED ENDOSCOPY N/A 02/19/2022   Procedure: DRUG INDUCED SLEEP ENDOSCOPY;  Surgeon: Mable Lenis, MD;  Location: [facility];  Service: ENT;  Laterality: N/A;   ESOPHAGOGASTRODUODENOSCOPY     EYE SURGERY     cataract surgery   IMPLANTATION OF HYPOGLOSSAL NERVE STIMULATOR Right 04/23/2022   Procedure: IMPLANTATION OF HYPOGLOSSAL NERVE STIMULATOR;  Surgeon: Mable Lenis, MD;  Location: [facility];  Service: ENT;  Laterality: Right;   KNEE ARTHROSCOPY  04/15/2012   Procedure: ARTHROSCOPY KNEE;  Surgeon: Toribio JULIANNA Chancy, MD;  Location: [facility];  Service: Orthopedics;  Laterality: Right;   LAPAROSCOPIC CHOLECYSTECTOMY     LASIK     OOPHORECTOMY     PALATE TO GINGIVA GRAFT  2017   REVERSE SHOULDER ARTHROPLASTY Left 02/13/2021   Procedure: REVERSE SHOULDER ARTHROPLASTY;  Surgeon: Josefina Chew, MD;  Location: [facility];  Service: Orthopedics;  Laterality: Left;   RIGHT/LEFT HEART CATH AND CORONARY ANGIOGRAPHY N/A 08/02/2016   Procedure: Right/Left Heart Cath and Coronary Angiography;  Surgeon: Ozell Fell, MD;  Location: [facility];  Service: Cardiovascular;  Laterality: N/A;   ROTATOR CUFF REPAIR Left    SHOULDER ARTHROSCOPY WITH ROTATOR CUFF REPAIR AND SUBACROMIAL DECOMPRESSION Right 12/24/2022   Procedure: SHOULDER ARTHROSCOPY WITH DEBRIDEMENT, ROTATOR CUFF REPAIR AND SUBACROMIAL DECOMPRESSION;  Surgeon: Josefina Chew, MD;  Location: [facility];  Service: Orthopedics;  Laterality: Right;   TEE WITHOUT CARDIOVERSION N/A 08/23/2016   Procedure: TRANSESOPHAGEAL ECHOCARDIOGRAM (TEE);  Surgeon: Sudie VEAR Laine, MD;  [facility];  Service: Open Heart Surgery;  Laterality: N/A;   TOTAL KNEE ARTHROPLASTY  04/15/2012   Procedure: TOTAL KNEE ARTHROPLASTY;  Surgeon: Toribio JULIANNA Chancy, MD;  Location: [facility] OR;  Service: Orthopedics;  Laterality: Right;   TRIGGER FINGER RELEASE  Right 04/20/2015   Procedure: RIGHT TRIGGER FINGER RELEASE (TENDON SHEALTH INCISION) ;  Surgeon: Toribio JULIANNA Chancy, MD;  Location: [facility];  Service: Orthopedics;  Laterality: Right;   Social  History   Socioeconomic History   Marital status: Married    Spouse name: Dasie   Number of children: 1   Years of education: Not on file   Highest education level: Master's degree (e.g., MA, MS, MEng, MEd, MSW, MBA)  Occupational History   Occupation: Retired, Visual Merchandiser: RETIRED  Tobacco Use   Smoking status: Never   Smokeless tobacco: Never  Vaping Use   Vaping status: Never Used  Substance and Sexual Activity   Alcohol use: No   Drug use: No   Sexual activity: Not Currently    Partners: Male    Birth control/protection: Surgical    Comment: hyst, More than 5, after 16, no std, no cancer, no abnormal pap, no DES  Other Topics Concern   Not on file  Social History Narrative   She lives with husband (1978) and two dogs. Step-son Beverley Seip (1971-psychiatrist in [place]). No grandkids. 2 dogs-sheltie/collie mix and border collie mix      Highest level of education:  Master in education   She is retired clinical biochemist x 32 years.      Hobbies: time with dogs   Right handed   One story home         Social Drivers of Health   Financial Resource Strain: Low Risk  (09/10/2023)   Overall Financial Resource Strain (CARDIA)    Difficulty of Paying Living Expenses: Not hard at all  Food Insecurity: No Food Insecurity (09/10/2023)   Hunger Vital Sign    Worried About Running Out of Food in the Last Year: Never true    Ran Out of Food in the Last Year: Never true  Transportation Needs: No Transportation Needs (09/10/2023)   PRAPARE - Administrator, Civil Service (Medical): No    Lack of Transportation (Non-Medical): No  Physical Activity: Insufficiently Active (09/10/2023)   Exercise Vital Sign    Days of Exercise per Week: 5 days    Minutes of Exercise per Session: 10 min  Stress: No Stress Concern Present (09/10/2023)   [employer] of Occupational Health - Occupational Stress Questionnaire    Feeling of Stress : Not at all  Social  Connections: Moderately Isolated (09/10/2023)   Social Connection and Isolation Panel    Frequency of Communication with Friends and Family: Three times a week    Frequency of Social Gatherings with Friends and Family: Three times a week    Attends Religious Services: Never    Active Member of Clubs or Organizations: No    Attends Banker Meetings: Never    Marital Status: Married  Catering Manager Violence: Not At Risk (09/10/2023)   Humiliation, Afraid, Rape, and Kick questionnaire    Fear of Current or Ex-Partner: No    Emotionally Abused: No    Physically Abused: No    Sexually Abused: No   Current Outpatient Medications on File Prior to Visit  Medication Sig Dispense Refill   Accu-Chek Softclix Lancets lancets Use as instructed 100 each 12   amLODipine  (NORVASC ) 10 MG tablet Take 1 tablet (10 mg total) by mouth daily. 90 tablet 3   atorvastatin  (LIPITOR) 40 MG tablet TAKE 1 TABLET BY MOUTH DAILY AT 6 PM. 90 tablet 3   Biotin 5  MG TBDP SMARTSIG:1 By Mouth     blood glucose meter kit and supplies KIT Dispense based on patient and insurance preference. Use up to four times daily as directed. Dx: E11.9 1 each 0   calcium -vitamin D  (OSCAL WITH D) 500-200 MG-UNIT per tablet Take 2 tablets by mouth daily.      clindamycin  (CLEOCIN ) 300 MG capsule TAKE 2 CAPSULES BY MOUTH 1 HOUR PRIOR TO DENTAL CLEANING. 6 capsule 2   cloNIDine  (CATAPRES ) 0.1 MG tablet Take 1 tablet (0.1 mg total) by mouth at bedtime. 90 tablet 3   clopidogrel  (PLAVIX ) 75 MG tablet TAKE 1 TABLET BY MOUTH EVERY DAY 90 tablet 3   D-5000 125 MCG (5000 UT) TABS SMARTSIG:1 Tablet(s) By Mouth     demeclocycline (DECLOMYCIN) 150 MG tablet Take 1 tablet (150 mg total) by mouth 2 (two) times daily. 180 tablet 3   empagliflozin  (JARDIANCE ) 10 MG TABS tablet Take 1 tablet (10 mg total) by mouth daily. 30 tablet 11   fluticasone  (FLONASE ) 50 MCG/ACT nasal spray Place 2 sprays into both nostrils daily.     gabapentin  (NEURONTIN) 300 MG capsule Take 1 capsule (300 mg total) by mouth 2 (two) times daily for 14 doses. 14 capsule 0   glucose blood (ACCU-CHEK GUIDE) test strip Use to test blood sugars daily. Dx: E11.9 100 strip 12   ipratropium (ATROVENT) 0.03 % nasal spray 2 SPRAYS IN EACH NOSTRIL AS NEEDED THREE TIMES AS NEEDED     irbesartan  (AVAPRO ) 300 MG tablet Take 1 tablet (300 mg total) by mouth daily. 90 tablet 3   loratadine  (CLARITIN ) 10 MG tablet Take 10 mg by mouth at bedtime.      Melatonin 5 MG TABS Take 5 mg by mouth at bedtime as needed (sleep).     metFORMIN  (GLUCOPHAGE ) 500 MG tablet TAKE 1 TABLET BY MOUTH WITH BREAKFAST AND 1/2 TO 1 TABLET WITH DINNER 180 tablet 1   metoCLOPramide  (REGLAN ) 10 MG tablet TAKE 1/2 TABLET BY MOUTH 4 TIMES A DAY 60 tablet 3   metroNIDAZOLE (METROCREAM) 0.75 % cream      Multiple Vitamins-Minerals (MULTIVITAMIN ADULTS) TABS SMARTSIG:1 By Mouth     omeprazole  (PRILOSEC) 40 MG capsule Take 1 capsule by mouth daily.     ondansetron  (ZOFRAN ) 4 MG tablet Take 1 tablet (4 mg total) by mouth every 8 (eight) hours as needed for up to 20 doses for nausea or vomiting. 20 tablet 0   Probiotic Product (ALIGN) 4 MG CAPS Take 4 mg by mouth daily.      psyllium (METAMUCIL SMOOTH TEXTURE) 28 % packet Take 1 packet by mouth 2 (two) times daily.     sennosides-docusate sodium  (SENOKOT-S) 8.6-50 MG tablet Take 2 tablets by mouth daily. 30 tablet 1   sodium chloride  1 g tablet Take 3 g by mouth 2 (two) times daily with a meal.     traMADol  (ULTRAM ) 50 MG tablet TAKE 1 TABLET (50 MG TOTAL) BY MOUTH EVERY 8 (EIGHT) HOURS AS NEEDED FOR MODERATE PAIN (PAIN SCORE 4-6) OR SEVERE PAIN (PAIN SCORE 7-10) (FOR CHRONIC PAIN. DO NOT DRIVE FOR 8 HOURS AFTER TAKING). 60 tablet 5   venlafaxine  XR (EFFEXOR -XR) 75 MG 24 hr capsule Take 1 capsule (75 mg total) by mouth daily with breakfast. 90 capsule 3   No current facility-administered medications on file prior to visit.   Allergies  Allergen  Reactions   Augmentin  [Amoxicillin -Pot Clavulanate] Rash   Erythromycin Nausea Only   Penicillins Itching and  Rash    Has patient had a PCN reaction causing immediate rash, facial/tongue/throat swelling, SOB or lightheadedness with hypotension: No Has patient had a PCN reaction causing severe rash involving mucus membranes or skin necrosis: No Has patient had a PCN reaction that required hospitalization: No Has patient had a PCN reaction occurring within the last 10 years: No  If all of the above answers are NO, then may proceed with Cephalosporin use. Tolerated Ancef  02/13/21   Family History  Problem Relation Age of Onset   COPD Mother    Colon polyps Mother    Irritable bowel syndrome Mother    Anxiety disorder Mother    Heart disease Father    Alcohol abuse Father    Breast cancer Maternal Grandmother    Gallbladder disease Maternal Grandmother    Colon polyps Maternal Aunt    Diabetes Paternal Uncle        several uncles   Other Neg Hx        hyponatremia   Colon cancer Neg Hx    Esophageal cancer Neg Hx    Rectal cancer Neg Hx    Stomach cancer Neg Hx    PE: BP 116/60   Pulse 88   Resp 18   Ht 5' 5 (1.651 m)   Wt 184 lb 6.4 oz (83.6 kg)   LMP  (LMP Unknown)   SpO2 98%   BMI 30.69 kg/m  Wt Readings from Last 25 Encounters:  05/03/24 184 lb 6.4 oz (83.6 kg)  04/27/24 188 lb (85.3 kg)  04/26/24 186 lb (84.4 kg)  04/13/24 182 lb (82.6 kg)  03/31/24 192 lb 12.8 oz (87.5 kg)  03/30/24 188 lb (85.3 kg)  03/11/24 188 lb (85.3 kg)  02/05/24 193 lb (87.5 kg)  01/15/24 202 lb 11.2 oz (91.9 kg)  01/08/24 198 lb (89.8 kg)  12/02/23 200 lb (90.7 kg)  11/27/23 203 lb (92.1 kg)  11/06/23 203 lb (92.1 kg)  10/21/23 201 lb (91.2 kg)  09/15/23 208 lb 12.8 oz (94.7 kg)  09/10/23 200 lb (90.7 kg)  09/02/23 200 lb (90.7 kg)  07/31/23 207 lb (93.9 kg)  07/03/23 201 lb (91.2 kg)  07/01/23 204 lb 12.8 oz (92.9 kg)  05/29/23 201 lb (91.2 kg)  05/08/23 202 lb 12.8 oz (92  kg)  05/02/23 207 lb 12.8 oz (94.3 kg)  04/29/23 201 lb (91.2 kg)  04/01/23 205 lb (93 kg)   Constitutional: overweight, in NAD Eyes:  EOMI, no exophthalmos ENT: no neck masses, no cervical lymphadenopathy Cardiovascular: RRR, No MRG Respiratory: CTA B Musculoskeletal: no deformities Skin:no rashes Neurological: + tremor with outstretched hands  ASSESSMENT: 1. Hyponatremia - Patient previously seen by Dr. Kassie.  She has a long history of mild hyponatremia, likely due to SIADH.  Previous investigation did not reveal a clear etiology: Normal cosyntropin  stimulation test, normal thyroid  tests, no history of cancer.  Also, she did not have pseudohyponatremia (no increased triglycerides or proteins), or dilutional hyponatremia (normal BMP, liver, kidney test), also no hypochloremia or low specific urine gravity to suggest water  overload, diarrhea/vomiting, longstanding diuretic use.  A brain MRI from 2021 showed a small occipital stroke and small white matter disease but no other pathology. - Her sodium levels fluctuate mostly between 130 and 136 but before last visit she had a level of 129 in 11/2022.  Upon questioning, at that time she mentions that her swallowing was worse and she felt that she needed another esophageal stretching.  She was drinking  more water  to get the foods down and we discussed that this could have been the reason for her lower sodium level.  I did advise her to reduce her fluid amount by 24 ounces a day.  A sodium level obtained at last visit was normal, at 137 but she had another mildly low sodium of 131 on 11/06/2023. - At last visit, I recommended a lower  fluid intake, however, at today's visit she mentioned that she is drinking more  liquids (see HPI).  We will repeat the sodium level today and see if we need to restrict her liquids again.  If so, we will decrease the dose of Coke 0 and juice. - She is on demeclocycline 300 mg twice a day and sodium chloride  tablets 3 g  twice a day (getting them from [employer]).  Will continue the current regimen for now - I will see the patient back in 1 year  2.  Elevated alkaline phosphatase - chronic, idiopathic - She had a bone scan performed in 08/2019 which did not show pagetoid changes, only degenerative changes - Continue to follow her without intervention -Before last visit, she had the lowest alkaline phosphatase she had in years, at 119 but we repeated the level at last visit and this was higher, at 158, although this was not a fasting sample.  Alkaline phosphatase from 10/2023 and this has actually dropped even lower, to 117.  Will not repeat this today. -Note, her vitamin D  level was normal at last check, at 49 in 04/2023 and we will repeat this today -She continues on vitamin D  5000 units + the amount in the multivitamin  Orders Placed This Encounter  Procedures   Vitamin D , 25-hydroxy   Sodium   Lela Fendt, MD PhD [facility] Endocrinology

## 2024-05-03 NOTE — Patient Instructions (Addendum)
 Please  continue: - Demeclocycline 300 mg 2x a day - NaCl tablets 3 g 2x a day  Please stop at the lab.  You should have an endocrinology follow-up appointment in 1 year.

## 2024-05-04 ENCOUNTER — Encounter: Payer: Self-pay | Admitting: Internal Medicine

## 2024-05-04 ENCOUNTER — Ambulatory Visit: Attending: Internal Medicine | Admitting: Internal Medicine

## 2024-05-04 ENCOUNTER — Ambulatory Visit: Payer: Self-pay | Admitting: Internal Medicine

## 2024-05-04 VITALS — BP 116/80 | HR 62 | Ht 66.0 in | Wt 182.2 lb

## 2024-05-04 DIAGNOSIS — E782 Mixed hyperlipidemia: Secondary | ICD-10-CM

## 2024-05-04 DIAGNOSIS — Z8673 Personal history of transient ischemic attack (TIA), and cerebral infarction without residual deficits: Secondary | ICD-10-CM | POA: Diagnosis not present

## 2024-05-04 DIAGNOSIS — I152 Hypertension secondary to endocrine disorders: Secondary | ICD-10-CM | POA: Diagnosis not present

## 2024-05-04 DIAGNOSIS — Z953 Presence of xenogenic heart valve: Secondary | ICD-10-CM | POA: Diagnosis not present

## 2024-05-04 DIAGNOSIS — I10 Essential (primary) hypertension: Secondary | ICD-10-CM

## 2024-05-04 DIAGNOSIS — E119 Type 2 diabetes mellitus without complications: Secondary | ICD-10-CM | POA: Diagnosis not present

## 2024-05-04 DIAGNOSIS — E1159 Type 2 diabetes mellitus with other circulatory complications: Secondary | ICD-10-CM

## 2024-05-04 DIAGNOSIS — I5032 Chronic diastolic (congestive) heart failure: Secondary | ICD-10-CM | POA: Diagnosis not present

## 2024-05-04 NOTE — Patient Instructions (Signed)
 Medication Instructions:  No Changes  Lab Work: None  Testing/Procedures: Your physician has requested that you have an echocardiogram, to do done around November 2026, prior to your one year follow up office visit. Echocardiography is a painless test that uses sound waves to create images of your heart. It provides your doctor with information about the size and shape of your heart and how well your heart's chambers and valves are working. This procedure takes approximately one hour. There are no restrictions for this procedure. Please do NOT wear cologne, perfume, aftershave, or lotions (deodorant is allowed). Please arrive 15 minutes prior to your appointment time.  Please note: We ask at that you not bring children with you during ultrasound (echo/ vascular) testing. Due to room size and safety concerns, children are not allowed in the ultrasound rooms during exams. Our front office staff cannot provide observation of children in our lobby area while testing is being conducted. An adult accompanying a patient to their appointment will only be allowed in the ultrasound room at the discretion of the ultrasound technician under special circumstances. We apologize for any inconvenience.   Follow-Up: At [facility], you and your health needs are our priority.  As part of our continuing mission to provide you with exceptional heart care, our providers are all part of one team.  This team includes your primary Cardiologist (physician) and Advanced Practice Providers or APPs (Physician Assistants and Nurse Practitioners) who all work together to provide you with the care you need, when you need it.  Your next appointment:   1 year(s) (Due Nov 2026 after your Echocardiogram) please call around August 2026 for appointment  Provider:   Gayatri A Acharya, MD    Other Instructions Please call us  or send a MyChart message with any Cardiology related questions/concerns.  [phone].  Thank  you!

## 2024-05-04 NOTE — Progress Notes (Signed)
 Cardiology Office Note:  .   Date:  05/04/2024  ID:  [name], DOB 1950-06-27, MRN [id number] PCP: Katrinka Garnette KIDD, MD  [facility] Providers Cardiologist:  Soyla DELENA Merck, MD    History of Present Illness: Hayley   Elvis EMMRY [name] is a 73 y.o. female.  Discussed the use of AI scribe software for clinical note transcription with the patient, who gave verbal consent to proceed.  History of Present Illness [name] is a 73 year old female with aortic valve replacement and chronic diastolic heart failure who presents for follow-up after valve replacement.  She underwent aortic valve replacement with a bioprosthetic valve on August 23, 2016, and has been monitored since. She feels well and has lost 51 pounds over the past two and a half years, with 21 pounds lost in the last four months after starting Jardiance . No chest pain, shortness of breath, or palpitations. Her recent EKG showed nonspecific abnormalities. She is scheduled for breast implant removal surgery on May 11, 2024. Current medications include atorvastatin , amlodipine , clonidine , Jardiance , irbesartan , and Plavix . She has type 2 diabetes, hyperlipidemia, obstructive sleep apnea on CPAP, chronic diastolic heart failure, hypertension, and a history of TIA.    ROS: negative except per HPI above.  Studies Reviewed: Hayley   EKG Interpretation Date/Time:  Tuesday May 04 2024 10:11:40 EST Ventricular Rate:  62 PR Interval:  136 QRS Duration:  90 QT Interval:  376 QTC Calculation: 381 R Axis:   30  Text Interpretation: Normal sinus rhythm with sinus arrhythmia Nonspecific ST and T wave abnormality Confirmed by Merck Soyla ([id number]) on 05/04/2024 10:44:58 AM    Results LABS Sodium: 130 (05/03/2024)  DIAGNOSTIC EKG: Minor abnormalities (05/04/2024) Echocardiogram: Normal systolic function, normal diastolic function, normal intracardiac pressure, valve with minor regurgitation, normal  post-valvular pressure (08/2023) Risk Assessment/Calculations:       Physical Exam:   VS:  BP 116/80 (BP Location: Left Arm, Patient Position: Sitting)   Pulse 62   Ht 5' 6 (1.676 m)   Wt 182 lb 3.2 oz (82.6 kg)   LMP  (LMP Unknown)   SpO2 96%   BMI 29.41 kg/m    Wt Readings from Last 3 Encounters:  05/04/24 182 lb 3.2 oz (82.6 kg)  05/03/24 184 lb 6.4 oz (83.6 kg)  04/27/24 188 lb (85.3 kg)     Physical Exam VITALS: BP- 116/80 GENERAL: Alert, cooperative, well developed, no acute distress HEENT: Normocephalic, normal oropharynx, moist mucous membranes CHEST: Clear to auscultation bilaterally, No wheezes, rhonchi, or crackles CARDIOVASCULAR: Normal heart rate and rhythm, S1 and S2 normal without murmurs, heart sounds normal, valve sounds crisp. ABDOMEN: Soft, non-tender, non-distended, without organomegaly, Normal bowel sounds EXTREMITIES: No cyanosis or edema NEUROLOGICAL: Cranial nerves grossly intact, Moves all extremities without gross motor or sensory deficit   ASSESSMENT AND PLAN: .    Assessment and Plan Assessment & Plan Status post aortic valve replacement with bioprosthetic valve for severe bicuspid aortic valve stenosis.  - 25 mm edwards intuity elite bovine pericardial valve (08/23/16) minimally invasive approach, Dr. Dusty. Echocardiogram showed normal cardiac function and valve performance with minimal leakage. No changes since last evaluation. - Schedule echocardiogram  before next follow-up.  Chronic diastolic heart failure Well-managed with no symptoms.  - Continue Jardiance  10 mg daily.  Essential hypertension Well-controlled with current medications. Blood pressure 116/80 mmHg. - Continue amlodipine  10 mg daily. - Continue clonidine  0.1 mg at bedtime. - Continue irbesartan  300 mg daily.  Hyperlipidemia  Managed with atorvastatin . - Continue atorvastatin  40 mg daily.  Type 2 diabetes mellitus Managed with Jardiance , aiding weight loss and appetite  control. - Continue Jardiance  10 mg daily.  History of transient ischemic attack Managed with Plavix  75 mg daily. Preoperative instructions provided.        Soyla Merck, MD, FACC

## 2024-05-05 ENCOUNTER — Encounter: Payer: Self-pay | Admitting: Family Medicine

## 2024-05-06 ENCOUNTER — Ambulatory Visit: Admitting: Obstetrics and Gynecology

## 2024-05-11 ENCOUNTER — Other Ambulatory Visit: Payer: Self-pay | Admitting: Student

## 2024-05-11 ENCOUNTER — Other Ambulatory Visit: Payer: Self-pay

## 2024-05-11 ENCOUNTER — Encounter: Payer: Medicare PPO | Admitting: Family Medicine

## 2024-05-11 DIAGNOSIS — T8544XA Capsular contracture of breast implant, initial encounter: Secondary | ICD-10-CM | POA: Diagnosis not present

## 2024-05-11 DIAGNOSIS — T8541XA Breakdown (mechanical) of breast prosthesis and implant, initial encounter: Secondary | ICD-10-CM | POA: Diagnosis not present

## 2024-05-11 MED ORDER — DOXYCYCLINE HYCLATE 100 MG PO TABS: 100.0000 mg | ORAL_TABLET | Freq: Two times a day (BID) | ORAL | 0 refills | Status: AC

## 2024-05-11 NOTE — Progress Notes (Signed)
 Postop antibiotics per Dr. Montorfano

## 2024-05-13 ENCOUNTER — Ambulatory Visit (INDEPENDENT_AMBULATORY_CARE_PROVIDER_SITE_OTHER): Payer: Self-pay | Admitting: Adult Health

## 2024-05-13 ENCOUNTER — Telehealth: Payer: Self-pay

## 2024-05-13 NOTE — Telephone Encounter (Signed)
 Patient would like to have someone give her a call she has alot of questions about her drians

## 2024-05-14 ENCOUNTER — Telehealth: Payer: Self-pay

## 2024-05-14 LAB — SURGICAL PATHOLOGY

## 2024-05-14 NOTE — Telephone Encounter (Signed)
 Pt called again, she had called yesterday and had questions concerning her surgery, she would like a call back today please

## 2024-05-18 ENCOUNTER — Ambulatory Visit (INDEPENDENT_AMBULATORY_CARE_PROVIDER_SITE_OTHER): Admitting: Physician Assistant

## 2024-05-18 VITALS — BP 139/70

## 2024-05-18 DIAGNOSIS — Z9886 Personal history of breast implant removal: Secondary | ICD-10-CM

## 2024-05-18 NOTE — Progress Notes (Signed)
 Patient is a pleasant 73 year old female with history of previous breast augmentation now s/p removal of bilateral breast implants with capsulectomy performed 05/11/2024 by Dr. Montorfano who presents to clinic for postoperative follow-up and consideration of drain removal.  Today, patient is accompanied by spouse at bedside.  She states that output has been scant over the course of the past 3 to 4 days.  Volume from each side has been less than 15 cc/day.  She denies any pain.  She is hopeful that the Steri-Strips can be removed, as well.  On exam, breasts are soft without any palpable underlying fluid collection concerning for seroma or hematoma.  Steri-Strips gently removed without complication.  Incisions are intact, skin edges well-approximated.  Drains are intact and functional with scant amount of normal-appearing serosanguineous output in bulbs.  Discussed risk of fluid collection after drain removal with premature, but patient would like to proceed given low volumes.  This is not unreasonable and her exam today is entirely benign.  Emphasized the importance of continued compression 24/7 after removal.  Drains removed without complication or difficulty, well-tolerated by patient.  Bordered Mepilex dressing placed over the inframammary incisions bilaterally.  Follow-up as scheduled, sooner if needed.  Will defer photos to next encounter.

## 2024-05-21 ENCOUNTER — Institutional Professional Consult (permissible substitution)

## 2024-05-21 ENCOUNTER — Ambulatory Visit

## 2024-05-24 NOTE — Progress Notes (Signed)
Encounter cancelled  °

## 2024-05-26 ENCOUNTER — Ambulatory Visit

## 2024-05-26 VITALS — BP 127/71 | HR 89

## 2024-05-26 DIAGNOSIS — L905 Scar conditions and fibrosis of skin: Secondary | ICD-10-CM

## 2024-05-26 DIAGNOSIS — Z09 Encounter for follow-up examination after completed treatment for conditions other than malignant neoplasm: Secondary | ICD-10-CM

## 2024-05-26 DIAGNOSIS — Z9886 Personal history of breast implant removal: Secondary | ICD-10-CM

## 2024-05-26 NOTE — Progress Notes (Addendum)
 Established Patient Office Visit  Subjective   Patient ID: [name], female    DOB: 08-08-1950  Age: 73 y.o. MRN: [id number]  Chief Complaint  Patient presents with   Post-op Follow-up    HPI  73 year old female with history of previous breast augmentation now s/p removal of bilateral breast implants with capsulectomy performed 05/11/2024 by Dr. Malisha Mabey who presents to clinic for postoperative follow-up.  Doing well from surgery standpoint. No issues.   Has mention now that would like the sternotomy scar revised due to discomfort and since its affecting her daily living and not allowing her to wear certain type of shirts.     Objective:     BP 127/71 (BP Location: Left Arm, Patient Position: Sitting, Cuff Size: Large)   Pulse 89   LMP  (LMP Unknown)   SpO2 99%  BP Readings from Last 3 Encounters:  05/26/24 127/71  05/18/24 139/70  05/04/24 116/80   Physical Exam On exam, breasts are soft without any palpable underlying fluid collection concerning for seroma or hematoma. Incisions are intact, skin edges well-approximated.   Sternotomy scar present, hypertropic and uncomfortable to touch.     Last CBC Lab Results  Component Value Date   WBC 7.5 11/06/2023   HGB 11.1 (L) 11/06/2023   HCT 34.5 (L) 11/06/2023   MCV 77.8 (L) 11/06/2023   MCH 27.3 02/09/2021   RDW 15.7 (H) 11/06/2023   PLT 391.0 11/06/2023   Last metabolic panel Lab Results  Component Value Date   GLUCOSE 192 (H) 11/06/2023   NA 130 (L) 05/03/2024   K 4.2 11/06/2023   CL 96 11/06/2023   CO2 27 11/06/2023   BUN 16 11/06/2023   CREATININE 0.56 11/06/2023   GFR 90.84 11/06/2023   CALCIUM  9.0 11/06/2023   PROT 6.9 11/06/2023   ALBUMIN  4.4 11/06/2023   LABGLOB 1.8 06/05/2021   AGRATIO 2.6 (H) 06/05/2021   BILITOT 0.4 11/06/2023   ALKPHOS 117 11/06/2023   AST 26 11/06/2023   ALT 19 11/06/2023   ANIONGAP 8 12/19/2022   SURGICAL PATHOLOGY  [facility]  [address]  [place], [place] [address]  Telephone [phone] or [phone] Fax [phone]   REPORT OF SURGICAL PATHOLOGY    Accession #: [id number]  Patient Name: [name], [name]  Visit # :   MRN: [id number]  Physician: Viridiana Spaid  DOB[birth year]Age [birth year] (Age: 73) Gender: F  Collected Date: 05/11/2024  Received Date: 05/11/2024   FINAL DIAGNOSIS        1. Breast, implant with capsule, Right :       - FIBROUS SOFT TISSUE CONSISTENT WITH BREAST CAPSULE.NEGATIVE FOR ATYPIA OR       MALIGNANCY.       - DISRUPTED IMPLANT, GROSS EXAMINATION ONLY.        2. Breast, implant with capsule, Left :       - FIBROUS SOFT TISSUE WITH CHRONIC INFLAMMATION AND DEGENERATIVE CHANGES,       CONSISTENT WITH BREAST CAPSULE.       - FIBROADIPOSE TISSUE WITH FAT NECROSIS.       - DISRUPTED BREAST IMPLANT, GROSS EXAMINATION ONLY .        ELECTRONIC SIGNATURE : Jordan Md, [employer] Officer, Sports Administrator, International Aid/development Worker    Assessment & Plan:   Problem List Items Addressed This Visit   None Visit Diagnoses       Follow-up exam    -  Primary  S/p breast implant removal         Midline sternotomy scar          Continue postoperative care as discussed previously.  Can shower, can not submerge incisions yet. Pathology discussed with patient.  We will wait 3-8-months to revise sternotomy scar. We will submit for insurance authorization in the meantime.  Follow-up as scheduled, sooner if needed.  Sims Laday M Clorene Nerio, MD

## 2024-06-01 ENCOUNTER — Telehealth: Payer: Self-pay | Admitting: Internal Medicine

## 2024-06-01 NOTE — Telephone Encounter (Signed)
 Inbound call from patient stating she was advised to reach back out after her surgery to be cleared to schedule her egd/colon. Patient would like to know if she can go ahead to schedule that double procedure. Please advise  Thank you

## 2024-06-02 ENCOUNTER — Telehealth: Payer: Self-pay | Admitting: *Deleted

## 2024-06-02 NOTE — Telephone Encounter (Signed)
°  [name] 12-01-1950 [id number]   Dear Dr Katrinka:  We have scheduled the above named patient for an endoscopy and colonoscopy procedure. Our records show that she is on anticoagulation therapy.  Please advise as to whether the patient may come off her therapy of plavix  5 days prior to her procedure which is scheduled for 06/25/24.  Please route your response to Naomie Sharps, RN.  Sincerely,  [facility]

## 2024-06-02 NOTE — Telephone Encounter (Signed)
 May hold Plavix  5 days- does slightly increase risk of stroke but procedure is important so we should move forward

## 2024-06-02 NOTE — Telephone Encounter (Signed)
 Request for surgical clearance:     Endoscopy Procedure  What type of surgery is being performed?     Endoscopy/colonoscopy  When is this surgery scheduled?     06/25/24  What type of clearance is required ?  CARDIAC CLEARANCE   Are there any medications that need to be held prior to surgery and how long? Plavix  (note sent separately to Dr Katrinka who prescribes this for patient).  Practice name and name of physician performing surgery?      [facility]  What is your office phone and fax number?      Phone- [phone]  Fax- [phone]  Anesthesia type (None, local, MAC, general) ?       MAC  Please route your response to Naomie Sharps, RN  Thank you!

## 2024-06-02 NOTE — Telephone Encounter (Signed)
 Patient has scheduled endoscopy and colonoscopy for 06/25/24 in [facility] with Dr Abran. She has also scheduled an in person previsit on 06/15/24 for instruction.  Anticoagulation clearance request has been sent to Dr GORMAN.Hunter, prescriber of patient's plavix .   Note that Dr Abran has requested patient take 81 mg ASA while off plavix  (once cleared by Dr Katrinka).  Patient also mentions that she was told by Sonny at her office visit in November that she should remind us  to get cardiac clearance. No mention of cardiac clearance seen in office note, however, since patient mentions this, we have forwarded a cardiac clearance request to Dr Loni for completeness.

## 2024-06-02 NOTE — Telephone Encounter (Signed)
 Katrinka Garnette KIDD, MD to Me (Selected Message)    06/02/24  1:07 PM Note May hold Plavix  5 days- does slightly increase risk of stroke but procedure is important so we should move forward

## 2024-06-04 ENCOUNTER — Encounter: Admitting: Surgical

## 2024-06-04 ENCOUNTER — Ambulatory Visit: Admitting: Student

## 2024-06-04 VITALS — BP 127/57 | HR 69

## 2024-06-04 DIAGNOSIS — Z9886 Personal history of breast implant removal: Secondary | ICD-10-CM

## 2024-06-04 DIAGNOSIS — Z9889 Other specified postprocedural states: Secondary | ICD-10-CM

## 2024-06-04 NOTE — Progress Notes (Signed)
 Patient is a 73 year old female who recently underwent removal of bilateral breast implants and capsulectomy with Dr. Montorfano on 05/11/2024.  Patient is a little over 3 weeks postop.  She presents to the clinic today for postoperative follow-up.  Patient was last in the clinic on 05/26/2024.  At this visit, patient was doing well from a surgery standpoint.  On exam, breasts were soft without any palpable underlying fluid collection.  Incisions were intact and the skin edges were well-approximated.  Today, patient presents with her husband at bedside.  She reports that she has been doing well.  She denies any fevers or chills.  She does not report any new concerns or complaints today.   Chaperone present on exam.  On exam, patient is sitting upright in no acute distress.  Breasts are soft and symmetric bilaterally.  There is no overlying erythema to either breast, no obvious fluid collections palpated on exam.  NAC's appear to be healthy bilaterally.  Incisions appear to be healing well.  There were a few Monocryl sutures noted.  These were cut and removed.  Patient tolerated well.  There are no signs of infection on exam.  I discussed with the patient that she will need to wear her compression bra or binder for another 3 weeks.  Discussed with her that she will also have activity restrictions for another 3 weeks.  She expressed understanding.  Recommended that she apply Vaseline daily to her incisions for 2 to 3 weeks.  Discussed with her after that time, she may start using scar creams or tapes if she would like.  Patient expressed understanding.  Patient to follow back up in about 3 to 4 weeks.  I instructed her to call in the meantime if she has any questions or concerns.

## 2024-06-07 ENCOUNTER — Encounter: Admitting: Family Medicine

## 2024-06-11 NOTE — Telephone Encounter (Signed)
" ° °  Name: [name]  DOB: July 08, 1950  MRN: [id number]   Primary Cardiologist: Soyla DELENA Merck, MD  Chart reviewed as part of pre-operative protocol coverage. Kenedie P Rozelle was last seen on 05/04/2024 by Dr. Merck.  Per Dr. Merck The patient is low risk for low risk procedure. No further cardiovascular testing is required prior to the procedure. If this level of risk is acceptable to the patient and surgical team, the patient should be considered optimized from a cardiovascular standpoint.  Therefore, based on ACC/AHA guidelines, the patient would be an acceptable risk for the planned procedure without further cardiovascular testing.   Plavix  prescribed by a noncardiology provider (PCP) therefore recommendations for holding deferred to prescribing provider.    I will route this recommendation to the requesting party via Epic fax function and remove from pre-op pool. Please call with questions.  Barnie Hila, NP 06/11/2024, 12:30 PM  "

## 2024-06-15 ENCOUNTER — Ambulatory Visit

## 2024-06-15 VITALS — Ht 66.0 in | Wt 180.0 lb

## 2024-06-15 DIAGNOSIS — Z860100 Personal history of colon polyps, unspecified: Secondary | ICD-10-CM

## 2024-06-15 DIAGNOSIS — K219 Gastro-esophageal reflux disease without esophagitis: Secondary | ICD-10-CM

## 2024-06-15 MED ORDER — NA SULFATE-K SULFATE-MG SULF 17.5-3.13-1.6 GM/177ML PO SOLN: 1.0000 | Freq: Once | ORAL | 0 refills | Status: AC

## 2024-06-15 NOTE — Progress Notes (Signed)
 No egg or soy allergy known to patient  No issues known to pt with past sedation with any surgeries or procedures Patient denies ever being told they had issues or difficulty with intubation  No FH of Malignant Hyperthermia Pt is not on diet pills Pt is not on  home 02  Pt is not on blood thinners  Pt denies issues with constipation  No A fib or A flutter Have any cardiac testing pending--no Pt can ambulate  Pt denies use of chewing tobacco Discussed diabetic I weight loss medication hoHOCP

## 2024-06-21 ENCOUNTER — Encounter: Payer: Self-pay | Admitting: Internal Medicine

## 2024-06-25 ENCOUNTER — Encounter: Payer: Self-pay | Admitting: Internal Medicine

## 2024-06-25 ENCOUNTER — Ambulatory Visit: Admitting: Internal Medicine

## 2024-06-25 VITALS — BP 138/75 | HR 81 | Temp 97.5°F | Resp 14 | Ht 66.0 in | Wt 180.0 lb

## 2024-06-25 DIAGNOSIS — K222 Esophageal obstruction: Secondary | ICD-10-CM | POA: Diagnosis not present

## 2024-06-25 DIAGNOSIS — Z860101 Personal history of adenomatous and serrated colon polyps: Secondary | ICD-10-CM

## 2024-06-25 DIAGNOSIS — K317 Polyp of stomach and duodenum: Secondary | ICD-10-CM | POA: Diagnosis not present

## 2024-06-25 DIAGNOSIS — Z1211 Encounter for screening for malignant neoplasm of colon: Secondary | ICD-10-CM | POA: Diagnosis not present

## 2024-06-25 DIAGNOSIS — K449 Diaphragmatic hernia without obstruction or gangrene: Secondary | ICD-10-CM | POA: Diagnosis not present

## 2024-06-25 DIAGNOSIS — R1319 Other dysphagia: Secondary | ICD-10-CM

## 2024-06-25 DIAGNOSIS — K219 Gastro-esophageal reflux disease without esophagitis: Secondary | ICD-10-CM

## 2024-06-25 DIAGNOSIS — Z860100 Personal history of colon polyps, unspecified: Secondary | ICD-10-CM

## 2024-06-25 DIAGNOSIS — D123 Benign neoplasm of transverse colon: Secondary | ICD-10-CM

## 2024-06-25 MED ORDER — SODIUM CHLORIDE 0.9 % IV SOLN: 500.0000 mL | Freq: Once | INTRAVENOUS | Status: DC

## 2024-06-25 NOTE — Progress Notes (Signed)
 Pt's states no medical or surgical changes since previsit or office visit.

## 2024-06-25 NOTE — Progress Notes (Signed)
 Sedate, gd SR, tolerated procedure well, VSS, report to RN

## 2024-06-25 NOTE — Op Note (Addendum)
 [facility] Patient Name: [name] Procedure Date: 06/25/2024 1:19 PM MRN: [id number] Endoscopist: Norleen SAILOR. Abran , MD, [id number] Age: 74 Referring MD:  Date of Birth: 25-Feb-1951 Gender: Female Account #: [id number] Procedure:                Colonoscopy with cold snare polypectomy x 1 Indications:              High risk colon cancer surveillance: Personal                            history of non-advanced adenoma. Previous                            examinations 2001, 2004, 2011, 2016 Medicines:                Monitored Anesthesia Care Procedure:                Pre-Anesthesia Assessment:                           - Prior to the procedure, a History and Physical                            was performed, and patient medications and                            allergies were reviewed. The patient's tolerance of                            previous anesthesia was also reviewed. The risks                            and benefits of the procedure and the sedation                            options and risks were discussed with the patient.                            All questions were answered, and informed consent                            was obtained. Prior Anticoagulants: The patient has                            taken Plavix . Last dose 5 days ago. ASA Grade                            Assessment: lII - A patient with mild systemic                            disease. After reviewing the risks and benefits,                            the patient was deemed in satisfactory condition to  undergo the procedure.                           After obtaining informed consent, the colonoscope                            was passed under direct vision. Throughout the                            procedure, the patient's blood pressure, pulse, and                            oxygen saturations were monitored continuously. The                            CF [id number]  #[id number] was introduced through the anus                            and advanced to the the cecum, identified by                            appendiceal orifice and ileocecal valve. The                            ileocecal valve, appendiceal orifice, and rectum                            were photographed. The quality of the bowel                            preparation was good. The colonoscopy was performed                            without difficulty. The patient tolerated the                            procedure well. The bowel preparation used was                            SUPREP via split dose instruction. Scope In: 1:31:32 PM Scope Out: 1:49:10 PM Scope Withdrawal Time: 0 hours 10 minutes 51 seconds  Total Procedure Duration: 0 hours 17 minutes 38 seconds  Findings:                 A 3 mm polyp was found in the transverse colon. The                            polyp was removed with a cold snare. Resection and                            retrieval were complete.                           The exam was otherwise without abnormality on  direct and retroflexion views. Complications:            No immediate complications. Estimated blood loss:                            None. Estimated Blood Loss:     Estimated blood loss: none. Impression:               - One 3 mm polyp in the transverse colon, removed                            with a cold snare. Resected and retrieved.                           - The examination was otherwise normal on direct                            and retroflexion views. Recommendation:           - Repeat colonoscopy is not recommended for                            surveillance.                           - Patient has a contact number available for                            emergencies. The signs and symptoms of potential                            delayed complications were discussed with the                            patient.  Return to normal activities tomorrow.                            Written discharge instructions were provided to the                            patient.                           - Resume previous diet.                           - Continue present medications. Resume Plavix  today.                           - Await pathology results. Norleen SAILOR. Abran, MD 06/25/2024 1:54:19 PM This report has been signed electronically.

## 2024-06-25 NOTE — Progress Notes (Signed)
 Expand All Collapse All HISTORY OF PRESENT ILLNESS:   [name] is a 74 y.o. female with multiple medical problems as listed below including aortic valve replacement and a history of TIA for which she is on Plavix .  She is also diabetic.  She has been seen in this office for the following issues:   1.  GERD complicated by peptic stricture requiring esophageal dilation.  Last EGD with dilation March 09, 2020.  54 French Maloney dilator 2.  Constipation predominant irritable bowel syndrome.  Currently being managed with MiraLAX. 3.  Screening colonoscopy February 2016.  Normal colonoscopy.  Follow-up 10 years   Patient tells me that she continues on omeprazole  40 mg daily for GERD.  This helps.  However, she has had recurrent intermittent solid food dysphagia over the past year.  This is worsening.  She feels she needs repeat dilation.  She continues on Plavix , which was held for her previous procedure.   Next, she reports that her constipation is treated with MiraLAX.  2 doses daily works nicely.  She wonders if this is okay to take.  She also mentions occasional left lower quadrant pain.  No exacerbating or relieving factors.  She does tell me that she is now due for her follow-up colonoscopy and would like to schedule it concurrent with an endoscopy.  For her diabetes she takes oral agents only her last hemoglobin A1c was 6.29 Oct 2023 normal liver test at that time.  Hemoglobin 11.1 at that time.   Does tell me that she has surgery scheduled this month.   REVIEW OF SYSTEMS:   All non-GI ROS negative except for anxiety, arthritis, headaches       Past Medical History:  Diagnosis Date   Allergy     Anxiety     Arthritis      back & knee   Asthma       mild per pt shows up with resp illness   Colon polyps     Constipation     Diabetes mellitus without complication (HCC)     Endometriosis     Gallstones     Gastric polyps     Gastroparesis     GERD (gastroesophageal  reflux disease)     Headache      sinus headaches and migraines at times   Heart murmur     History of migraine headaches     HTN (hypertension)     Hyperlipidemia     IBS (irritable bowel syndrome)     Joint pain     Lumbar disc disease     PONV (postoperative nausea and vomiting)     S/P aortic valve replacement with bioprosthetic valve 08/23/2016    25 mm Edwards Intuity rapid-deployment bovine pericardial tissue valve via partial upper mini sternotomy   Sleep apnea      does not use CPAP   TIA (transient ischemic attack)     TIA (transient ischemic attack)      hx of per pt               Past Surgical History:  Procedure Laterality Date   ABDOMINAL HYSTERECTOMY       AORTIC VALVE REPLACEMENT N/A 08/23/2016    Procedure: AORTIC VALVE REPLACEMENT (AVR) - using partial Upper Sternotomy- 25mm Edwards Intuity Aortic Valve used;  Surgeon: Sudie VEAR Laine, MD;  Location: [facility] OR;  Service: Open Heart Surgery;  Laterality: N/A;   BLADDER SUSPENSION   2007  [facility] sling   BREAST ENHANCEMENT SURGERY   1980   CARPAL TUNNEL RELEASE Bilateral     COLONOSCOPY       DORSAL COMPARTMENT RELEASE Right 09/15/2014    Procedure: RIGHT WRIST DEQUERVAINS RELEASE ;  Surgeon: Toribio Chancy, MD;  Location: [facility];  Service: Orthopedics;  Laterality: Right;   DRUG INDUCED ENDOSCOPY N/A 02/19/2022    Procedure: DRUG INDUCED SLEEP ENDOSCOPY;  Surgeon: Mable Lenis, MD;  Location: [facility];  Service: ENT;  Laterality: N/A;   ESOPHAGOGASTRODUODENOSCOPY       EYE SURGERY        cataract surgery   IMPLANTATION OF HYPOGLOSSAL NERVE STIMULATOR Right 04/23/2022    Procedure: IMPLANTATION OF HYPOGLOSSAL NERVE STIMULATOR;  Surgeon: Mable Lenis, MD;  Location: [facility];  Service: ENT;  Laterality: Right;   KNEE ARTHROSCOPY   04/15/2012    Procedure: ARTHROSCOPY KNEE;  Surgeon: Toribio JULIANNA Chancy, MD;  Location: [facility] OR;  Service: Orthopedics;   Laterality: Right;   LAPAROSCOPIC CHOLECYSTECTOMY       LASIK       OOPHORECTOMY       PALATE TO GINGIVA GRAFT   2017   REVERSE SHOULDER ARTHROPLASTY Left 02/13/2021    Procedure: REVERSE SHOULDER ARTHROPLASTY;  Surgeon: Josefina Chew, MD;  Location: [facility];  Service: Orthopedics;  Laterality: Left;   RIGHT/LEFT HEART CATH AND CORONARY ANGIOGRAPHY N/A 08/02/2016    Procedure: Right/Left Heart Cath and Coronary Angiography;  Surgeon: Ozell Fell, MD;  Location: [facility] INVASIVE [facility];  Service: Cardiovascular;  Laterality: N/A;   ROTATOR CUFF REPAIR Left     SHOULDER ARTHROSCOPY WITH ROTATOR CUFF REPAIR AND SUBACROMIAL DECOMPRESSION Right 12/24/2022    Procedure: SHOULDER ARTHROSCOPY WITH DEBRIDEMENT, ROTATOR CUFF REPAIR AND SUBACROMIAL DECOMPRESSION;  Surgeon: Josefina Chew, MD;  Location: [facility];  Service: Orthopedics;  Laterality: Right;   TEE WITHOUT CARDIOVERSION N/A 08/23/2016    Procedure: TRANSESOPHAGEAL ECHOCARDIOGRAM (TEE);  Surgeon: Sudie VEAR Laine, MD;  Location: [facility] OR;  Service: Open Heart Surgery;  Laterality: N/A;   TOTAL KNEE ARTHROPLASTY   04/15/2012    Procedure: TOTAL KNEE ARTHROPLASTY;  Surgeon: Toribio JULIANNA Chancy, MD;  Location: [facility] OR;  Service: Orthopedics;  Laterality: Right;   TRIGGER FINGER RELEASE Right 04/20/2015    Procedure: RIGHT TRIGGER FINGER RELEASE (TENDON SHEALTH INCISION) ;  Surgeon: Toribio JULIANNA Chancy, MD;  Location:  SURGERY CENTER;  Service: Orthopedics;  Laterality: Right;          Social History Taria P Bagby  reports that she has never smoked. She has never used smokeless tobacco. She reports that she does not drink alcohol and does not use drugs.   family history includes Alcohol abuse in her father; Anxiety disorder in her mother; Breast cancer in her maternal grandmother; COPD in her mother; Colon polyps in her maternal aunt and mother; Diabetes in her paternal uncle; Gallbladder disease in her maternal grandmother;  Heart disease in her father; Irritable bowel syndrome in her mother.   Allergies       Allergies  Allergen Reactions   Augmentin  [Amoxicillin -Pot Clavulanate] Rash   Erythromycin Nausea Only   Penicillins Itching and Rash      Has patient had a PCN reaction causing immediate rash, facial/tongue/throat swelling, SOB or lightheadedness with hypotension: No Has patient had a PCN reaction causing severe rash involving mucus membranes or skin necrosis: No Has patient had a PCN reaction that required hospitalization: No Has patient  had a PCN reaction occurring within the last 10 years: No  If all of the above answers are NO, then may proceed with Cephalosporin use. Tolerated Ancef  02/13/21            PHYSICAL EXAMINATION: Vital signs: BP 128/62 (BP Location: Left Arm, Patient Position: Sitting, Cuff Size: Normal)   Pulse 82   Ht 5' 6 (1.676 m)   Wt 188 lb (85.3 kg)   LMP  (LMP Unknown)   BMI 30.34 kg/m   Constitutional: generally well-appearing, no acute distress Psychiatric: alert and oriented x3, cooperative Eyes: extraocular movements intact, anicteric, conjunctiva pink Mouth: oral pharynx moist, no lesions Neck: supple no lymphadenopathy Cardiovascular: heart regular rate and rhythm, no murmur Lungs: clear to auscultation bilaterally Abdomen: soft, nontender, nondistended, no obvious ascites, no peritoneal signs, normal bowel sounds, no organomegaly.  Tenderness along the inguinal ligament on the left Rectal: Deferred until colonoscopy Extremities: no clubbing, cyanosis, or lower extremity edema bilaterally Skin: no lesions on visible extremities Neuro: No focal deficits. No asterixis.       ASSESSMENT:   1.  GERD complicated by peptic stricture.  Acid suppressive medication effective.  However, with recurrent dysphagia likely secondary to recurrent peptic stricture. 2.  Constipation predominant IBS.  Managed with MiraLAX 3.  Colonoscopy.  Normal colonoscopy 10 years  ago.  Not due for follow-up 4.  Multiple medical problems including the need for chronic Plavix  therapy     PLAN:   1.  Reflux precautions 2.  Continue omeprazole  3.  Continue MiraLAX for constipation 4.  Schedule upper endoscopy with esophageal dilation.  The patient is high risk given her comorbidities and the need to interrupt Plavix .  She should initiate aspirin  therapy (baby aspirin  1 daily) upon holding Plavix .  She understands.The nature of the procedure, as well as the risks, benefits, and alternatives were carefully and thoroughly reviewed with the patient. Ample time for discussion and questions allowed. The patient understood, was satisfied, and agreed to proceed. 5.  Schedule screening colonoscopy along with upper endoscopy.  She is high risk as above.  We will schedule this concurrently to minimize the time that she is on Plavix  and have 1 anesthesia session.The nature of the procedure, as well as the risks, benefits, and alternatives were carefully and thoroughly reviewed with the patient. Ample time for discussion and questions allowed. The patient understood, was satisfied, and agreed to proceed. The patient will wait until she has recovered from her upcoming surgery to contact the office to schedule her procedures.  She requests a morning appointment.  In addition to holding Plavix  for 5 days and initiating baby aspirin , her oral diabetic medications need to be held the day of the procedure.  We reviewed this issue and she understands.      Recent H&P as above.  No interval change in history or physical exam.

## 2024-06-25 NOTE — Progress Notes (Signed)
 Called to room to assist during endoscopic procedure.  Patient ID and intended procedure confirmed with present staff. Received instructions for my participation in the procedure from the performing physician.

## 2024-06-25 NOTE — Patient Instructions (Addendum)
 Post dilation diet, see handout Continue present medications Also Resume PLAVIX  today at previous dose You will NOT need another screening colonoscopy (as you will be over age 74 when the next screening is due), however, Please call us  at [phone] if you have a change in bowel habits, change in family history of colo-rectal cancer, rectal bleeding or other GI concern in the future. Await pathology results  Handouts/information given for polyps, post dilation diet  YOU HAD AN ENDOSCOPIC PROCEDURE TODAY AT THE [facility]:   Refer to the procedure report that was given to you for any specific questions about what was found during the examination.  If the procedure report does not answer your questions, please call your gastroenterologist to clarify.  If you requested that your care partner not be given the details of your procedure findings, then the procedure report has been included in a sealed envelope for you to review at your convenience later.  YOU SHOULD EXPECT: Some feelings of bloating in the abdomen. Passage of more gas than usual.  Walking can help get rid of the air that was put into your GI tract during the procedure and reduce the bloating. If you had a lower endoscopy (such as a colonoscopy or flexible sigmoidoscopy) you may notice spotting of blood in your stool or on the toilet paper. If you underwent a bowel prep for your procedure, you may not have a normal bowel movement for a few days.  Please Note:  You might notice some irritation and congestion in your nose or some drainage.  This is from the oxygen used during your procedure.  There is no need for concern and it should clear up in a day or so.  SYMPTOMS TO REPORT IMMEDIATELY:  Following lower endoscopy (colonoscopy):  Excessive amounts of blood in the stool  Significant tenderness or worsening of abdominal pains  Swelling of the abdomen that is new, acute  Fever of 100F or higher Following upper  endoscopy (EGD)  Vomiting of blood or coffee ground material  New chest pain or pain under the shoulder blades  Painful or persistently difficult swallowing  New shortness of breath  Black, tarry-looking stools For urgent or emergent issues, a gastroenterologist can be reached at any hour by calling [phone]. Do not use MyChart messaging for urgent concerns.   DIET:  We do recommend a small meal at first, but then you may proceed to your regular diet.  Drink plenty of fluids but you should avoid alcoholic beverages for 24 hours.  ACTIVITY:  You should plan to take it easy for the rest of today and you should NOT DRIVE or use heavy machinery until tomorrow (because of the sedation medicines used during the test).    FOLLOW UP: Our staff will call the number listed on your records the next business day following your procedure.  We will call around 7:15- 8:00 am to check on you and address any questions or concerns that you may have regarding the information given to you following your procedure. If we do not reach you, we will leave a message.     If any biopsies were taken you will be contacted by phone or by letter within the next 1-3 weeks.  Please call us  at [phone] if you have not heard about the biopsies in 3 weeks.   SIGNATURES/CONFIDENTIALITY: You and/or your care partner have signed paperwork which will be entered into your electronic medical record.  These signatures attest to  the fact that that the information above on your After Visit Summary has been reviewed and is understood.  Full responsibility of the confidentiality of this discharge information lies with you and/or your care-partner.

## 2024-06-25 NOTE — Op Note (Signed)
  [facility] Patient Name: [name] Procedure Date: 06/25/2024 1:18 PM MRN: [id number] Endoscopist: Norleen SAILOR. Abran , MD, [id number] Age: 74 Referring MD:  Date of Birth: 04-12-1951 Gender: Female Account #: [id number] Procedure:                Upper GI endoscopy with balloon dilation of the                            esophagus. 20 mm Indications:              Dysphagia, , Therapeutic procedure, Esophageal                            reflux, GERD Medicines:                Monitored Anesthesia Care Procedure:                Pre-Anesthesia Assessment:                           - Prior to the procedure, a History and Physical                            was performed, and patient medications and                            allergies were reviewed. The patient's tolerance of                            previous anesthesia was also reviewed. The risks                            and benefits of the procedure and the sedation                            options and risks were discussed with the patient.                            All questions were answered, and informed consent                            was obtained. Prior Anticoagulants: The patient has                            taken Plavix  (clopidogrel ), last dose was 5 days                            prior to procedure. ASA Grade Assessment: III - A                            patient with severe systemic disease. After                            reviewing the risks and benefits, the patient was  deemed in satisfactory condition to undergo the                            procedure.                           After obtaining informed consent, the endoscope was                            passed under direct vision. Throughout the                            procedure, the patient's blood pressure, pulse, and                            oxygen saturations were monitored continuously. The                             GIF [id number] #[id number] was introduced through the                            mouth, and advanced to the second part of duodenum.                            The upper GI endoscopy was accomplished without                            difficulty. The patient tolerated the procedure                            well. Scope In: Scope Out: Findings:                 One benign-appearing, intrinsic moderate stenosis                            was found. This stenosis measured 1.5 cm (inner                            diameter). A TTS dilator was passed through the                            scope. Dilation with an 18-19-20 mm balloon dilator                            was performed to 20 mm.                           The exam of the esophagus was otherwise normal.                           The stomach and hiatal hernia and multiple benign                            fundic land type polyps.  The examined duodenum was normal.                           The cardia and gastric fundus were normal on                            retroflexion. Complications:            No immediate complications. Estimated Blood Loss:     Estimated blood loss: none. Impression:               - Benign-appearing esophageal stenosis. Dilated.                           - Benign fundic gland polyps and hiatal hernia.                           - Normal examined duodenum.                           - No specimens collected. Recommendation:           - Patient has a contact number available for                            emergencies. The signs and symptoms of potential                            delayed complications were discussed with the                            patient. Return to normal activities tomorrow.                            Written discharge instructions were provided to the                            patient.                           - Post dilation diet.                           -  Continue present medications.                           - Resume Plavix  today Shenique Childers N. Abran, MD 06/25/2024 2:06:41 PM This report has been signed electronically.

## 2024-06-28 ENCOUNTER — Telehealth: Payer: Self-pay | Admitting: *Deleted

## 2024-06-28 NOTE — Telephone Encounter (Signed)
" °  Follow up Call-     06/25/2024    1:00 PM  Call back number  Post procedure Call Back phone  # [phone]  Permission to leave phone message Yes     Patient questions:  Do you have a fever, pain , or abdominal swelling? No. Pain Score  0 *  Have you tolerated food without any problems? Yes.    Have you been able to return to your normal activities? Yes.    Do you have any questions about your discharge instructions: Diet   No. Medications  No. Follow up visit  No.  Do you have questions or concerns about your Care? No.  Actions: * If pain score is 4 or above: No action needed, pain <4.   "

## 2024-06-30 ENCOUNTER — Ambulatory Visit: Payer: Self-pay | Admitting: Internal Medicine

## 2024-06-30 LAB — SURGICAL PATHOLOGY

## 2024-07-01 ENCOUNTER — Encounter (INDEPENDENT_AMBULATORY_CARE_PROVIDER_SITE_OTHER): Payer: Self-pay | Admitting: Adult Health

## 2024-07-01 ENCOUNTER — Ambulatory Visit (INDEPENDENT_AMBULATORY_CARE_PROVIDER_SITE_OTHER): Admitting: Adult Health

## 2024-07-01 VITALS — BP 119/68 | HR 65 | Temp 99.1°F | Ht 66.0 in | Wt 179.0 lb

## 2024-07-01 DIAGNOSIS — I10 Essential (primary) hypertension: Secondary | ICD-10-CM | POA: Diagnosis not present

## 2024-07-01 DIAGNOSIS — Z6828 Body mass index (BMI) 28.0-28.9, adult: Secondary | ICD-10-CM

## 2024-07-01 DIAGNOSIS — Z7984 Long term (current) use of oral hypoglycemic drugs: Secondary | ICD-10-CM | POA: Diagnosis not present

## 2024-07-01 DIAGNOSIS — E669 Obesity, unspecified: Secondary | ICD-10-CM | POA: Diagnosis not present

## 2024-07-01 DIAGNOSIS — E66812 Obesity, class 2: Secondary | ICD-10-CM

## 2024-07-01 DIAGNOSIS — E1169 Type 2 diabetes mellitus with other specified complication: Secondary | ICD-10-CM

## 2024-07-01 DIAGNOSIS — E1159 Type 2 diabetes mellitus with other circulatory complications: Secondary | ICD-10-CM

## 2024-07-01 DIAGNOSIS — G4733 Obstructive sleep apnea (adult) (pediatric): Secondary | ICD-10-CM

## 2024-07-01 DIAGNOSIS — Z7985 Long-term (current) use of injectable non-insulin antidiabetic drugs: Secondary | ICD-10-CM

## 2024-07-01 DIAGNOSIS — I152 Hypertension secondary to endocrine disorders: Secondary | ICD-10-CM

## 2024-07-01 NOTE — Progress Notes (Signed)
 "    WEIGHT SUMMARY AND BIOMETRICS  Vitals Temp: 99.1 F (37.3 C) BP: 119/68 Pulse Rate: 65 SpO2: 99 %   Anthropometric Measurements Height: 5' 6 (1.676 m) Weight: 179 lb (81.2 kg) BMI (Calculated): 28.91 Weight at Last Visit: 182lb Weight Lost Since Last Visit: 3lb Weight Gained Since Last Visit: 0lb Starting Weight: 220lb Total Weight Loss (lbs): 41 lb (18.6 kg)   Body Composition  Body Fat %: 43.5 % Fat Mass (lbs): 77.8 lbs Muscle Mass (lbs): 96 lbs Total Body Water  (lbs): 69 lbs Visceral Fat Rating : 12   Other Clinical Data RMR: [id number] Fasting: No Labs: No Today's Visit #: 18 Starting Date: 01/19/18    Chief Complaint:   OBESITY [name] is here to discuss her progress with her obesity treatment plan.  She is on the the Category 2 Plan and states she is following her eating plan approximately 65 % of the time.  She states she is exercising Walking 15 minutes 2 times per week.  Interim History:  Last OV at [facility] was 04/13/2024 Screening Mammogram revealed R Breat implant was leaking. S/p removal of bilateral breast implants with capsulectomy performed 05/11/2024 by Dr. Montorfano  She underwent EGD/Colonoscopy 06/25/2024 1 polyp removed- benign If she remains sx free- she will not complete an additional C-scope She denies family hx of colon cancer  Current weight 179 lbs Goal weight 170 lbs  Subjective:   1. Type 2 diabetes mellitus with other specified complication, without long-term current use of insulin  [facility]) Lab Results  Component Value Date   HGBA1C 6.7 (H) 11/06/2023   HGBA1C 7.0 (H) 05/08/2023   HGBA1C 6.3 10/03/2022    She is taking irbesartan  (AVAPRO ) 300 MG tablet  empagliflozin  (JARDIANCE ) 10 MG TABS tablet  metFORMIN  (GLUCOPHAGE ) 500 MG tablet  atorvastatin  (LIPITOR) 40 MG tablet   2. Hypertension associated with diabetes (HCC) BP at goal at OV She is taking amLODipine  (NORVASC ) 10 MG tablet  cloNIDine  (CATAPRES ) 0.1 MG  tablet  irbesartan  (AVAPRO ) 300 MG tablet  empagliflozin  (JARDIANCE ) 10 MG TABS tablet  metFORMIN  (GLUCOPHAGE ) 500 MG tablet  atorvastatin  (LIPITOR) 40 MG tablet   3. OSA (obstructive sleep apnea) 01/15/2024 Pulmonology OV Notes 74 yo for follow-up of OSA   PMH -  hypertension, diabetes, stroke, diastolic dysfunction and aortic valve replacement, SIADH on demeclocycline   She was unable to tolerate CPAP and underwent implantation of hypoglossal nerve stimulator by Dr. Mable on 04/23/22.  07/02/22 Device activated >> Start delay 30 minutes, pause time 15 minutes, duration 12 hours  09/2022 we felt she was overstimulated based on sleep study results and decreased voltage to 1.4 V.  Home sleep test at this level showed high residual AHI.   11/2022 We switched her  to electrode B configuration. sensation 0.5 V , functional level 0.7 V lower limit, upper limit 1.2 V.  She was set at level 2 =0.8 V. there was good tongue protrusion at this level.  We also checked electrode C configuration but there seem to be some retraction rather than protrusion   Assessment/Plan:   1. Type 2 diabetes mellitus with other specified complication, without long-term current use of insulin  (HCC) Continue healthy eating and regular walking  2. Hypertension associated with diabetes (HCC) Continue healthy eating and regular walking  3. OSA (obstructive sleep apnea) Continue healthy eating and regular walking Continue with Inspire F/u with Pulmonology as directed  4. Obesity, CURRENT BMI 28.9 (Primary)  [name] is currently in the action stage  of change. As such, her goal is to continue with weight loss efforts. She has agreed to the Category 2 Plan.   Exercise goals: Older adults should follow the adult guidelines. When older adults cannot meet the adult guidelines, they should be as physically active as their abilities and conditions will allow.  Older adults should do exercises that maintain or improve  balance if they are at risk of falling.  Older adults should determine their level of effort for physical activity relative to their level of fitness.  Older adults with chronic conditions should understand whether and how their conditions affect their ability to do regular physical activity safely.  Behavioral modification strategies: increasing lean protein intake, decreasing simple carbohydrates, increasing vegetables, increasing water  intake, no skipping meals, meal planning and cooking strategies, keeping healthy foods in the home, ways to avoid boredom eating, and planning for success.  Nil has agreed to follow-up with our clinic in 4 weeks. She was informed of the importance of frequent follow-up visits to maximize her success with intensive lifestyle modifications for her multiple health conditions.   Objective:   Blood pressure 119/68, pulse 65, temperature 99.1 F (37.3 C), height 5' 6 (1.676 m), weight 179 lb (81.2 kg), SpO2 99%. Body mass index is 28.89 kg/m.  General: Cooperative, alert, well developed, in no acute distress. HEENT: Conjunctivae and lids unremarkable. Cardiovascular: Regular rhythm.  Lungs: Normal work of breathing. Neurologic: No focal deficits.   Lab Results  Component Value Date   CREATININE 0.56 11/06/2023   BUN 16 11/06/2023   NA 130 (L) 05/03/2024   K 4.2 11/06/2023   CL 96 11/06/2023   CO2 27 11/06/2023   Lab Results  Component Value Date   ALT 19 11/06/2023   AST 26 11/06/2023   ALKPHOS 117 11/06/2023   BILITOT 0.4 11/06/2023   Lab Results  Component Value Date   HGBA1C 6.7 (H) 11/06/2023   HGBA1C 7.0 (H) 05/08/2023   HGBA1C 6.3 10/03/2022   HGBA1C 6.3 (H) 03/05/2022   HGBA1C 6.3 09/24/2021   Lab Results  Component Value Date   INSULIN  6.3 03/05/2022   INSULIN  12.9 06/05/2021   INSULIN  7.7 01/25/2021   INSULIN  13.1 04/20/2020   INSULIN  12.1 01/19/2018   Lab Results  Component Value Date   TSH 4.23 09/17/2021   Lab  Results  Component Value Date   CHOL 152 05/08/2023   HDL 60.60 05/08/2023   LDLCALC 73 05/08/2023   LDLDIRECT 72.0 11/06/2023   TRIG 92.0 05/08/2023   CHOLHDL 3 05/08/2023   Lab Results  Component Value Date   VD25OH 67 05/03/2024   VD25OH 49 05/02/2023   VD25OH 62.2 03/05/2022   Lab Results  Component Value Date   WBC 7.5 11/06/2023   HGB 11.1 (L) 11/06/2023   HCT 34.5 (L) 11/06/2023   MCV 77.8 (L) 11/06/2023   PLT 391.0 11/06/2023   Lab Results  Component Value Date   IRON 48 06/12/2022   TIBC 450.8 (H) 06/12/2022   FERRITIN 12.0 06/12/2022   Attestation Statements:   Reviewed by clinician on day of visit: allergies, medications, problem list, medical history, surgical history, family history, social history, and previous encounter notes.  Time spent on visit including pre-visit chart review and post-visit care and charting was 18 minutes.   I have reviewed the above documentation for accuracy and completeness, and I agree with the above. -  Brittony Billick d. Mallory Schaad, NP-C "

## 2024-07-08 ENCOUNTER — Telehealth: Payer: Self-pay | Admitting: Family Medicine

## 2024-07-08 ENCOUNTER — Encounter: Admitting: Student

## 2024-07-08 NOTE — Telephone Encounter (Signed)
 LVM To reschedule pt's 09/23/24 Physical since their provider will no longer be in office that day.

## 2024-07-08 NOTE — Progress Notes (Unsigned)
 Patient is a 74 year old female who recently underwent removal of bilateral breast implants and capsulectomy with Dr. Montorfano on 05/11/2024.  Patient is a little over 8 weeks postop.  She presents to the clinic today for postoperative follow-up.     Patient was last seen in the clinic on 06/04/2024.  At this visit, patient was doing well.  On exam, breasts were soft and symmetric.  NAC's were healthy bilaterally.  Incisions were healing well.  It was discussed that patient will need to wear her compression for another few weeks and avoid heavy activities for a few weeks.  Today,

## 2024-07-18 ENCOUNTER — Other Ambulatory Visit: Payer: Self-pay | Admitting: Family Medicine

## 2024-07-22 ENCOUNTER — Ambulatory Visit: Admitting: Student

## 2024-07-22 DIAGNOSIS — Z9886 Personal history of breast implant removal: Secondary | ICD-10-CM

## 2024-07-22 NOTE — Progress Notes (Signed)
 Patient is a 74 year old female who recently underwent removal of bilateral breast implants and capsulectomy with Dr. Montorfano on 05/11/2024.  Patient is a little over 8 weeks postop.  She presents to the clinic today for postoperative follow-up.     Patient was last seen in the clinic on 06/04/2024.  At this visit, patient was doing well.  On exam, breasts were soft and symmetric.  NAC's were healthy bilaterally.  Incisions were healing well.  It was discussed that patient will need to wear her compression for another few weeks and avoid heavy activities for a few weeks.  Today, patient reports she is doing well.  She denies any new issues or concerns.  She does not report any fevers or chills.  Chaperone present on exam.  On exam, patient sitting upright in no acute distress.  Breasts are soft and symmetric.  There is no overlying erythema, no obvious fluid collection on exam.  NAC's appear to be healthy bilaterally.  Incisions appear to be a little bit raised, otherwise well-healed.  There are no signs of infection on exam.  Discussed with the patient that I would like her to use silicone based scar creams or tapes on her incisions and to gently massage the incisions.  Patient expressed understanding.  Discussed with the patient that she no longer has to wear compression bra may transition into a regular bra with no underwire.  Discussed with her she may start increasing her activities.  She expressed understanding.  Patient to follow back up with Dr. Montorfano in about a month.  Instructed her to call in the meantime if she has any questions or concerns about anything.  Pictures were obtained of the patient and placed in the chart with the patient's or guardian's permission.

## 2024-07-29 ENCOUNTER — Ambulatory Visit (INDEPENDENT_AMBULATORY_CARE_PROVIDER_SITE_OTHER): Admitting: Adult Health

## 2024-07-29 VITALS — BP 133/64 | HR 63 | Temp 99.8°F | Ht 66.0 in | Wt 179.0 lb

## 2024-07-29 DIAGNOSIS — E1159 Type 2 diabetes mellitus with other circulatory complications: Secondary | ICD-10-CM

## 2024-07-29 DIAGNOSIS — G4733 Obstructive sleep apnea (adult) (pediatric): Secondary | ICD-10-CM

## 2024-07-29 DIAGNOSIS — E1169 Type 2 diabetes mellitus with other specified complication: Secondary | ICD-10-CM

## 2024-07-29 DIAGNOSIS — E66812 Obesity, class 2: Secondary | ICD-10-CM

## 2024-07-29 NOTE — Progress Notes (Unsigned)
 "    WEIGHT SUMMARY AND BIOMETRICS  Vitals Temp: 99.8 F (37.7 C) BP: 133/64 Pulse Rate: 63 SpO2: 97 %   Anthropometric Measurements Height: 5' 6 (1.676 m) Weight: 179 lb (81.2 kg) BMI (Calculated): 28.91 Weight at Last Visit: 179 lb Weight Lost Since Last Visit: 0 Weight Gained Since Last Visit: 0 Starting Weight: 220 lb Total Weight Loss (lbs): 41 lb (18.6 kg)   Body Composition  Body Fat %: 43.4 % Fat Mass (lbs): 77.6 lbs Muscle Mass (lbs): 96.2 lbs Total Body Water  (lbs): 68.6 lbs Visceral Fat Rating : 12   Other Clinical Data Fasting: no Labs: no Today's Visit #: 11 Starting Date: 01/19/18    Chief Complaint:   OBESITY [name] is here to discuss her progress with her obesity treatment plan.  She is on the the Category 2 Plan and states she is following her eating plan approximately 65 % of the time.  She states she is exercising Short Walks 10-15 minutes 3 times per week.  Interim History: *** Hunger/appetite-*** Cravings- *** Stress- *** Sleep- *** Exercise-*** Hydration-***  Subjective:   1. Hyperlipidemia associated with type 2 diabetes mellitus (HCC) ***  2. Type 2 diabetes mellitus with other specified complication, without long-term current use of insulin  (HCC) ***  3. Hypertension associated with diabetes (HCC) ***  4. OSA (obstructive sleep apnea)    Assessment/Plan:   1. Hyperlipidemia associated with type 2 diabetes mellitus (HCC) ***  2. Type 2 diabetes mellitus with other specified complication, without long-term current use of insulin  (HCC) (Primary) ***  3. Hypertension associated with diabetes (HCC) ***  4. OSA (obstructive sleep apnea) ***  5. Obesity, CURRENT BMI 28.9  [name] is currently in the action stage of change. As such, her goal is to continue with weight loss efforts. She has agreed to the Category 2 Plan.   Exercise goals: Older adults should follow the adult guidelines. When older adults  cannot meet the adult guidelines, they should be as physically active as their abilities and conditions will allow.  Older adults should do exercises that maintain or improve balance if they are at risk of falling.  Older adults should determine their level of effort for physical activity relative to their level of fitness.  Older adults with chronic conditions should understand whether and how their conditions affect their ability to do regular physical activity safely.  Behavioral modification strategies: increasing lean protein intake, decreasing simple carbohydrates, increasing vegetables, increasing water  intake, no skipping meals, meal planning and cooking strategies, keeping healthy foods in the home, ways to avoid boredom eating, and planning for success.  [name] has agreed to follow-up with our clinic in 4 weeks. She was informed of the importance of frequent follow-up visits to maximize her success with intensive lifestyle modifications for her multiple health conditions.   Complete Fasting Labs at annual physical with established PCP  Objective:   Blood pressure 133/64, pulse 63, temperature 99.8 F (37.7 C), height 5' 6 (1.676 m), weight 179 lb (81.2 kg), SpO2 97%. Body mass index is 28.89 kg/m.  General: Cooperative, alert, well developed, in no acute distress. HEENT: Conjunctivae and lids unremarkable. Cardiovascular: Regular rhythm.  Lungs: Normal work of breathing. Neurologic: No focal deficits.   Lab Results  Component Value Date   CREATININE 0.56 11/06/2023   BUN 16 11/06/2023   NA 130 (L) 05/03/2024   K 4.2 11/06/2023   CL 96 11/06/2023   CO2 27 11/06/2023   Lab Results  Component Value  Date   ALT 19 11/06/2023   AST 26 11/06/2023   ALKPHOS 117 11/06/2023   BILITOT 0.4 11/06/2023   Lab Results  Component Value Date   HGBA1C 6.7 (H) 11/06/2023   HGBA1C 7.0 (H) 05/08/2023   HGBA1C 6.3 10/03/2022   HGBA1C 6.3 (H) 03/05/2022   HGBA1C 6.3 09/24/2021    Lab Results  Component Value Date   INSULIN  6.3 03/05/2022   INSULIN  12.9 06/05/2021   INSULIN  7.7 01/25/2021   INSULIN  13.1 04/20/2020   INSULIN  12.1 01/19/2018   Lab Results  Component Value Date   TSH 4.23 09/17/2021   Lab Results  Component Value Date   CHOL 152 05/08/2023   HDL 60.60 05/08/2023   LDLCALC 73 05/08/2023   LDLDIRECT 72.0 11/06/2023   TRIG 92.0 05/08/2023   CHOLHDL 3 05/08/2023   Lab Results  Component Value Date   VD25OH 67 05/03/2024   VD25OH 49 05/02/2023   VD25OH 62.2 03/05/2022   Lab Results  Component Value Date   WBC 7.5 11/06/2023   HGB 11.1 (L) 11/06/2023   HCT 34.5 (L) 11/06/2023   MCV 77.8 (L) 11/06/2023   PLT 391.0 11/06/2023   Lab Results  Component Value Date   IRON 48 06/12/2022   TIBC 450.8 (H) 06/12/2022   FERRITIN 12.0 06/12/2022   Attestation Statements:   Reviewed by clinician on day of visit: allergies, medications, problem list, medical history, surgical history, family history, social history, and previous encounter notes.  Time spent on visit including pre-visit chart review and post-visit care and charting was 28 minutes.   I have reviewed the above documentation for accuracy and completeness, and I agree with the above. -  Daziya Redmond d. Yamir Carignan, NP-C "

## 2024-07-30 ENCOUNTER — Encounter

## 2024-08-06 ENCOUNTER — Encounter

## 2024-08-26 ENCOUNTER — Ambulatory Visit (INDEPENDENT_AMBULATORY_CARE_PROVIDER_SITE_OTHER): Admitting: Nurse Practitioner

## 2024-08-27 ENCOUNTER — Ambulatory Visit

## 2024-09-01 ENCOUNTER — Ambulatory Visit: Admitting: Obstetrics and Gynecology

## 2024-09-09 ENCOUNTER — Encounter: Admitting: Student

## 2024-09-10 ENCOUNTER — Encounter

## 2024-09-14 ENCOUNTER — Ambulatory Visit

## 2024-09-23 ENCOUNTER — Encounter: Admitting: Family Medicine

## 2024-09-27 ENCOUNTER — Encounter (HOSPITAL_BASED_OUTPATIENT_CLINIC_OR_DEPARTMENT_OTHER): Payer: Self-pay

## 2024-09-27 ENCOUNTER — Ambulatory Visit (HOSPITAL_BASED_OUTPATIENT_CLINIC_OR_DEPARTMENT_OTHER): Admit: 2024-09-27

## 2024-09-27 SURGERY — REVISION, SCAR
Anesthesia: Choice | Site: Chest

## 2024-10-06 ENCOUNTER — Encounter

## 2024-10-20 ENCOUNTER — Encounter

## 2024-11-30 ENCOUNTER — Encounter: Admitting: Family Medicine

## 2024-12-22 ENCOUNTER — Encounter

## 2025-04-04 ENCOUNTER — Encounter: Admitting: Obstetrics and Gynecology

## 2025-04-28 ENCOUNTER — Ambulatory Visit (HOSPITAL_COMMUNITY)

## 2025-05-03 ENCOUNTER — Ambulatory Visit: Admitting: Internal Medicine
